# Patient Record
Sex: Male | Born: 1955
Health system: Southern US, Community
[De-identification: ages and names within clinical notes are randomized; demographics above are authoritative.]

## PROBLEM LIST (undated history)

## (undated) DIAGNOSIS — R011 Cardiac murmur, unspecified: Secondary | ICD-10-CM

## (undated) DIAGNOSIS — S81802A Unspecified open wound, left lower leg, initial encounter: Secondary | ICD-10-CM

## (undated) DIAGNOSIS — E1121 Type 2 diabetes mellitus with diabetic nephropathy: Secondary | ICD-10-CM

## (undated) DIAGNOSIS — E1140 Type 2 diabetes mellitus with diabetic neuropathy, unspecified: Secondary | ICD-10-CM

## (undated) DIAGNOSIS — C801 Malignant (primary) neoplasm, unspecified: Secondary | ICD-10-CM

## (undated) DIAGNOSIS — S42302A Unspecified fracture of shaft of humerus, left arm, initial encounter for closed fracture: Secondary | ICD-10-CM

## (undated) DIAGNOSIS — S81801A Unspecified open wound, right lower leg, initial encounter: Secondary | ICD-10-CM

## (undated) DIAGNOSIS — N186 End stage renal disease: Secondary | ICD-10-CM

## (undated) DIAGNOSIS — F329 Major depressive disorder, single episode, unspecified: Secondary | ICD-10-CM

## (undated) DIAGNOSIS — I4891 Unspecified atrial fibrillation: Secondary | ICD-10-CM

## (undated) DIAGNOSIS — I10 Essential (primary) hypertension: Secondary | ICD-10-CM

## (undated) DIAGNOSIS — Z9289 Personal history of other medical treatment: Secondary | ICD-10-CM

## (undated) DIAGNOSIS — D649 Anemia, unspecified: Secondary | ICD-10-CM

## (undated) DIAGNOSIS — K219 Gastro-esophageal reflux disease without esophagitis: Secondary | ICD-10-CM

## (undated) DIAGNOSIS — S0502XA Injury of conjunctiva and corneal abrasion without foreign body, left eye, initial encounter: Secondary | ICD-10-CM

## (undated) DIAGNOSIS — Z940 Kidney transplant status: Secondary | ICD-10-CM

## (undated) DIAGNOSIS — Z992 Dependence on renal dialysis: Secondary | ICD-10-CM

## (undated) DIAGNOSIS — F32A Depression, unspecified: Secondary | ICD-10-CM

## (undated) HISTORY — DX: Type 2 diabetes mellitus with diabetic neuropathy, unspecified: E11.40

## (undated) HISTORY — DX: Malignant (primary) neoplasm, unspecified: C80.1

## (undated) HISTORY — PX: OTHER SURGICAL HISTORY: SHX169

## (undated) HISTORY — DX: Type 2 diabetes mellitus with diabetic nephropathy: E11.21

## (undated) HISTORY — PX: TONSILLECTOMY: SUR1361

## (undated) HISTORY — PX: COLONOSCOPY: SHX174

## (undated) HISTORY — DX: Anemia, unspecified: D64.9

## (undated) HISTORY — PX: REMOVAL OF A DIALYSIS CATHETER: SHX6053

## (undated) HISTORY — PX: NEPHRECTOMY TRANSPLANTED ORGAN: SUR880

## (undated) HISTORY — DX: Essential (primary) hypertension: I10

## (undated) HISTORY — PX: VASCULAR SURGERY: SHX849

---
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
## 2000-05-20 HISTORY — PX: BILIARY DIVERSION, EXTERNAL: SHX1228

## 2000-05-20 HISTORY — PX: PORTACATH PLACEMENT: SHX2246

## 2000-05-20 HISTORY — PX: WHIPPLE PROCEDURE: SHX2667

## 2001-03-10 ENCOUNTER — Ambulatory Visit (HOSPITAL_COMMUNITY): Admission: RE | Admit: 2001-03-10 | Discharge: 2001-03-10 | Payer: Self-pay | Admitting: Family Medicine

## 2001-03-10 ENCOUNTER — Encounter: Payer: Self-pay | Admitting: Family Medicine

## 2001-03-12 ENCOUNTER — Encounter: Payer: Self-pay | Admitting: Gastroenterology

## 2001-03-12 ENCOUNTER — Encounter (INDEPENDENT_AMBULATORY_CARE_PROVIDER_SITE_OTHER): Payer: Self-pay | Admitting: Specialist

## 2001-03-12 ENCOUNTER — Ambulatory Visit (HOSPITAL_COMMUNITY): Admission: RE | Admit: 2001-03-12 | Discharge: 2001-03-12 | Payer: Self-pay | Admitting: Gastroenterology

## 2001-03-19 ENCOUNTER — Encounter: Payer: Self-pay | Admitting: Surgery

## 2001-03-19 ENCOUNTER — Observation Stay (HOSPITAL_COMMUNITY): Admission: RE | Admit: 2001-03-19 | Discharge: 2001-03-20 | Payer: Self-pay | Admitting: Surgery

## 2001-03-21 ENCOUNTER — Inpatient Hospital Stay (HOSPITAL_COMMUNITY): Admission: EM | Admit: 2001-03-21 | Discharge: 2001-04-11 | Payer: Self-pay | Admitting: Emergency Medicine

## 2001-03-21 ENCOUNTER — Encounter (INDEPENDENT_AMBULATORY_CARE_PROVIDER_SITE_OTHER): Payer: Self-pay | Admitting: *Deleted

## 2001-03-21 ENCOUNTER — Ambulatory Visit (HOSPITAL_COMMUNITY): Admission: RE | Admit: 2001-03-21 | Discharge: 2001-03-21 | Payer: Self-pay | Admitting: Surgery

## 2001-03-21 ENCOUNTER — Encounter: Payer: Self-pay | Admitting: Surgery

## 2001-03-23 ENCOUNTER — Encounter: Payer: Self-pay | Admitting: Surgery

## 2001-03-28 ENCOUNTER — Encounter: Payer: Self-pay | Admitting: Surgery

## 2001-04-24 ENCOUNTER — Ambulatory Visit (HOSPITAL_BASED_OUTPATIENT_CLINIC_OR_DEPARTMENT_OTHER): Admission: RE | Admit: 2001-04-24 | Discharge: 2001-04-24 | Payer: Self-pay | Admitting: Surgery

## 2001-04-24 ENCOUNTER — Encounter: Payer: Self-pay | Admitting: Surgery

## 2001-04-24 ENCOUNTER — Encounter (INDEPENDENT_AMBULATORY_CARE_PROVIDER_SITE_OTHER): Payer: Self-pay | Admitting: Specialist

## 2001-10-29 ENCOUNTER — Encounter: Payer: Self-pay | Admitting: Oncology

## 2001-10-29 ENCOUNTER — Ambulatory Visit (HOSPITAL_COMMUNITY): Admission: RE | Admit: 2001-10-29 | Discharge: 2001-10-29 | Payer: Self-pay | Admitting: Oncology

## 2001-11-12 ENCOUNTER — Ambulatory Visit (HOSPITAL_BASED_OUTPATIENT_CLINIC_OR_DEPARTMENT_OTHER): Admission: RE | Admit: 2001-11-12 | Discharge: 2001-11-12 | Payer: Self-pay | Admitting: Surgery

## 2002-04-28 ENCOUNTER — Encounter: Payer: Self-pay | Admitting: Oncology

## 2002-04-28 ENCOUNTER — Ambulatory Visit (HOSPITAL_COMMUNITY): Admission: RE | Admit: 2002-04-28 | Discharge: 2002-04-28 | Payer: Self-pay | Admitting: Oncology

## 2002-05-30 ENCOUNTER — Encounter: Payer: Self-pay | Admitting: Orthopedic Surgery

## 2002-05-30 ENCOUNTER — Ambulatory Visit (HOSPITAL_COMMUNITY): Admission: RE | Admit: 2002-05-30 | Discharge: 2002-05-30 | Payer: Self-pay | Admitting: Orthopedic Surgery

## 2002-06-07 ENCOUNTER — Encounter: Admission: RE | Admit: 2002-06-07 | Discharge: 2002-06-07 | Payer: Self-pay | Admitting: Orthopedic Surgery

## 2002-06-07 ENCOUNTER — Encounter: Payer: Self-pay | Admitting: Orthopedic Surgery

## 2002-12-20 ENCOUNTER — Encounter: Payer: Self-pay | Admitting: Oncology

## 2002-12-20 ENCOUNTER — Ambulatory Visit (HOSPITAL_COMMUNITY): Admission: RE | Admit: 2002-12-20 | Discharge: 2002-12-20 | Payer: Self-pay | Admitting: Oncology

## 2003-01-27 ENCOUNTER — Ambulatory Visit (HOSPITAL_COMMUNITY): Admission: RE | Admit: 2003-01-27 | Discharge: 2003-01-27 | Payer: Self-pay | Admitting: Gastroenterology

## 2003-12-29 ENCOUNTER — Ambulatory Visit (HOSPITAL_COMMUNITY): Admission: RE | Admit: 2003-12-29 | Discharge: 2003-12-29 | Payer: Self-pay | Admitting: Oncology

## 2004-03-22 ENCOUNTER — Ambulatory Visit (HOSPITAL_COMMUNITY): Admission: RE | Admit: 2004-03-22 | Discharge: 2004-03-22 | Payer: Self-pay | Admitting: Oncology

## 2004-04-02 ENCOUNTER — Ambulatory Visit: Payer: Self-pay | Admitting: Oncology

## 2004-04-23 ENCOUNTER — Ambulatory Visit (HOSPITAL_COMMUNITY): Admission: RE | Admit: 2004-04-23 | Discharge: 2004-04-23 | Payer: Self-pay | Admitting: Oncology

## 2004-05-22 ENCOUNTER — Ambulatory Visit: Payer: Self-pay | Admitting: Oncology

## 2005-01-14 ENCOUNTER — Ambulatory Visit: Payer: Self-pay | Admitting: Oncology

## 2005-01-17 ENCOUNTER — Ambulatory Visit (HOSPITAL_COMMUNITY): Admission: RE | Admit: 2005-01-17 | Discharge: 2005-01-17 | Payer: Self-pay | Admitting: Oncology

## 2005-12-25 ENCOUNTER — Ambulatory Visit: Payer: Self-pay | Admitting: Oncology

## 2005-12-27 LAB — CEA: CEA: 2 ng/mL (ref 0.0–5.0)

## 2005-12-27 LAB — COMPREHENSIVE METABOLIC PANEL
ALT: 11 U/L (ref 0–40)
CO2: 24 mEq/L (ref 19–32)
Calcium: 9.2 mg/dL (ref 8.4–10.5)
Chloride: 107 mEq/L (ref 96–112)
Creatinine, Ser: 0.75 mg/dL (ref 0.40–1.50)
Sodium: 142 mEq/L (ref 135–145)
Total Protein: 6.6 g/dL (ref 6.0–8.3)

## 2006-01-01 ENCOUNTER — Encounter: Admission: RE | Admit: 2006-01-01 | Discharge: 2006-01-01 | Payer: Self-pay | Admitting: Oncology

## 2006-12-25 ENCOUNTER — Ambulatory Visit: Payer: Self-pay | Admitting: Oncology

## 2007-06-29 ENCOUNTER — Ambulatory Visit: Payer: Self-pay | Admitting: Oncology

## 2007-09-03 ENCOUNTER — Ambulatory Visit: Payer: Self-pay | Admitting: Oncology

## 2008-10-05 ENCOUNTER — Ambulatory Visit: Payer: Self-pay | Admitting: Oncology

## 2010-04-19 ENCOUNTER — Encounter
Admission: RE | Admit: 2010-04-19 | Discharge: 2010-05-16 | Payer: Self-pay | Source: Home / Self Care | Attending: Diagnostic Neuroimaging | Admitting: Diagnostic Neuroimaging

## 2010-05-20 HISTORY — PX: BONE MARROW BIOPSY: SHX199

## 2010-05-20 HISTORY — PX: RENAL BIOPSY, PERCUTANEOUS: SUR144

## 2010-06-09 ENCOUNTER — Encounter: Payer: Self-pay | Admitting: Oncology

## 2010-06-10 ENCOUNTER — Encounter: Payer: Self-pay | Admitting: Oncology

## 2010-09-25 ENCOUNTER — Other Ambulatory Visit: Payer: Self-pay | Admitting: Nephrology

## 2010-09-25 DIAGNOSIS — N179 Acute kidney failure, unspecified: Secondary | ICD-10-CM

## 2010-09-26 ENCOUNTER — Ambulatory Visit
Admission: RE | Admit: 2010-09-26 | Discharge: 2010-09-26 | Disposition: A | Discharge status: Home / Self Care | Payer: Managed Care, Other (non HMO) | Source: Ambulatory Visit | Attending: Nephrology | Admitting: Nephrology

## 2010-09-26 DIAGNOSIS — N179 Acute kidney failure, unspecified: Secondary | ICD-10-CM

## 2010-10-05 ENCOUNTER — Other Ambulatory Visit: Payer: Self-pay | Admitting: Oncology

## 2010-10-05 ENCOUNTER — Encounter (HOSPITAL_BASED_OUTPATIENT_CLINIC_OR_DEPARTMENT_OTHER): Payer: Managed Care, Other (non HMO) | Admitting: Oncology

## 2010-10-05 DIAGNOSIS — Z8509 Personal history of malignant neoplasm of other digestive organs: Secondary | ICD-10-CM

## 2010-10-05 DIAGNOSIS — D649 Anemia, unspecified: Secondary | ICD-10-CM

## 2010-10-05 DIAGNOSIS — N289 Disorder of kidney and ureter, unspecified: Secondary | ICD-10-CM

## 2010-10-05 LAB — CBC WITH DIFFERENTIAL/PLATELET
EOS%: 2.1 % (ref 0.0–7.0)
HCT: 25.7 % — ABNORMAL LOW (ref 38.4–49.9)
HGB: 8.8 g/dL — ABNORMAL LOW (ref 13.0–17.1)
LYMPH%: 31 % (ref 14.0–49.0)
MONO%: 8.1 % (ref 0.0–14.0)
NEUT%: 58.5 % (ref 39.0–75.0)
Platelets: 154 10*3/uL (ref 140–400)
RBC: 2.95 10*6/uL — ABNORMAL LOW (ref 4.20–5.82)

## 2010-10-05 LAB — RETICULOCYTES
RBC: 2.99 10*6/uL — ABNORMAL LOW (ref 4.20–5.82)
Retic %: 0.68 % (ref 0.50–1.60)
Retic Ct Abs: 20.33 10*3/uL — ABNORMAL LOW (ref 24.10–77.50)

## 2010-10-05 LAB — HOLD TUBE, BLOOD BANK

## 2010-10-05 NOTE — Op Note (Signed)
   NAME:  Paul [name], Paul [name]                           ACCOUNT NO.:  [id number]   MEDICAL RECORD NO.:  [id number]                   PATIENT TYPE:  AMB   LOCATION:  ENDO                                 FACILITY:  [facility]   PHYSICIAN:  [name], M.D.                 DATE OF BIRTH:  1956/01/16   DATE OF PROCEDURE:  DATE OF DISCHARGE:                                 OPERATIVE REPORT   PROCEDURE:  Colonoscopy.   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   DESCRIPTION OF PROCEDURE:  The patient was placed in the left lateral  decubitus position then placed on the pulse monitor with continuous low flow  oxygen delivered by nasal cannula. He was sedated with 125 mcg IV fentanyl  and 10 mg IV Versed. The Olympus video colonoscope was inserted into the  rectum and advanced to the cecum, confirmed by transillumination at  McBurney's point and visualization of the ileocecal valve and appendiceal  orifice and intubation of the terminal ileum. The prep was somewhat limited  throughout the colon with a lot of adherent semisolid material that would  not easily lavage and I could thus not rule out flat lesions less than 1 cm  in diameter in all areas. Otherwise, the cecum, ascending, transverse,  descending, sigmoid and rectum all appeared normal with no masses, polyps,  diverticula or other mucosal abnormalities. The scope was then withdrawn and  the patient returned to the recovery room in stable condition. The patient  tolerated the procedure well and there were no immediate complications.   IMPRESSION:  Normal colonoscopy limited by somewhat poor prep.   PLAN:  History of duodenal carcinoma. I am going to repeat his colonoscopy  in three years with a more aggressive prep.                                               John C.  Amedeo Plenty, M.D.    [facility]MED[facility]  D:  01/27/2003  T:  01/27/2003  Job:  [id number]   cc:   [name], M.D.  [address]  [place] [address]  Fax: [phone]

## 2010-10-05 NOTE — Procedures (Signed)
[facility]  Patient:    Paul [name], Paul [name] Visit Number: [id number] MRN: [id number]          Service Type: END Location: ENDO Attending Physician:  Rafael Bihari Proc. Date: 03/12/01 Admit Date:  03/12/2001 Discharge Date: 03/12/2001   CC:         Shanon Brow B. Burnett Harry, M.D.  [name], M.D.   Procedure Report  PROCEDURE:  Esophagogastroduodenoscopy with biopsy.  INDICATION:  Obstructive jaundice with computed tomography scan showing biliary obstruction, pancreatic duct dilatation, and what appeared to be a large duodenal mass.  DESCRIPTION OF PROCEDURE:  The patient was placed in the prone position and placed on the pulse monitor with continuous low-flow oxygen delivered by nasal cannula.  He was sedated with 100 mg IV Demerol and 8 mg IV Versed.  The Olympus side-viewing endoscope was advanced blindly to the oropharynx and esophagus.  The esophagus was straight and of normal caliber with the squamocolumnar line at 38 cm.  The stomach was entered, and a small amount of liquid secretions were suctioned from the fundus.  Retroflexed view of the cardia was unremarkable.  The fundus, body and pylorus all appeared normal. The duodenum was entered, and the bulb appeared normal but around the first bend, there was a large, friable, seemingly circumferential duodenal mass that completely obscured localization of the ampulla, and I did not think it was feasible or likely successful to attempt prolonged probing with the sphincterotome to try to identify the ampulla which was felt to be unlikely. The mass was quite friable and typical for malignancy in appearance and texture.  Multiple biopsies were taken.  I could not pass the side-viewing scope completely beyond it, and it appeared to involve the entire circumference of the duodenum in areas.  The scope was then withdrawn, and the patient returned to the recovery room in stable condition.  He tolerated  the procedure well, and there were no immediate complications.  IMPRESSION:  Large periampullary/duodenal mass, highly suspicious for malignancy.  PLAN:  Will need surgical consultation for evaluation for possible radical curative surgery versus biliary and gastric bypass. Attending Physician:  Rafael Bihari DD:  03/12/01 TD:  03/14/01 Job: [id number]

## 2010-10-05 NOTE — Discharge Summary (Signed)
[facility]  Patient:    Paul [name], MCLOONE Visit Number: [id number] MRN: [id number]          Service Type: DSU Location: [facility] Attending Physician:  Gomez Cleverly Dictated by:   [name], M.D. Admit Date:  04/24/2001 Discharge Date: 04/24/2001                             Discharge Summary  REASON FOR ADMISSION:  Upper gastrointestinal obstruction, duodenal carcinoma.  BRIEF HISTORY:  The patient is a 55 year old white male who has previously been seen and evaluated in my practice for newly diagnosed duodenal mass. Biopsies show adenocarcinoma.  The mass was near obstructing.  The patient was anemic due to bleeding from the mass.  He was undergoing preoperative assessment with CT scan, MRI scan, visceral arteriogram, and percutaneous transhepatic catheter placement for biliary decompression.  In the midst of his workup, the patient developed nausea, vomiting, fatigue, and dehydration. He presented to the emergency department at [facility] where he was seen and evaluated by Dr. Milana Kidney and admitted to my service at [place]:  The patient was admitted on March 21, 2001 by Dr. Rise Patience.  The patient was also seen in consultation by Dr. Mills Koller from [facility].  The patients diabetes was managed by the hospitalist. A central line was placed to establish hyperalimentation for malnutrition. The patient was then gradually prepared for the operating room.  After approximately seven days of hyperalimentation, the patient was taken to the operating room, where he underwent exploratory laparotomy and Whipple procedure for primary carcinoma of the duodenum.  He also underwent placement of feeding jejunostomy tube and cholecystectomy concurrently.  The operative time was approximately nine hours.  The patient was admitted to the intensive care unit postoperatively.  He did well.  He  remained stable.  He continued to require hyperalimentation.  He continued to require close attention to treatment of hyperglycemia by the hospitalist.  The patient made steady progress without significant postoperative complication.  Foley catheter was removed.  He was seen in consultation by Dr. Julieanne Manson from medical oncology.  The patient was begun on tube feedings through his jejunostomy feeding tube.  This was gradually advanced.  The patient became ambulatory. Nasogastric tube was removed on the sixth postoperative day.  He was started on a clear-liquid diet by the seventh postoperative day.  The patient continued to be followed closely by [facility] throughout his postoperative course.  He began diabetic teaching.  The patient was advanced to a full-liquid diet and started on oral medications, both per mouth and per jejunostomy feeding tube.  The patient was advanced to a 2400 calorie ADA diet on the 12th postoperative day.  Staples were removed from the wounds.  IVs were discontinued on the 13th postoperative day.  The patient was prepared for discharge home on April 11, 2001.  DISCHARGE PLAN:  The patient was discharged on April 11, 2001 in good condition, tolerating a diabetic diet, and ambulating independently.  DISCHARGE MEDICATIONS: 1. Tylox for pain. 2. Reglan before meals and at bedtime. 3. Pepcid twice daily. 4. 70/30 insulin 28 units subcu q.a.m. and 8 units subcu q.p.m.  FOLLOWUP:  The patient will be seen back in my office at [facility] in 10 days.  Followup has been arranged with his primary care physician, Dr. Gilmore Laroche, in two weeks.  The patient will  also be seen in followup by Dr. Julieanne Manson at the cancer center.  FINAL DIAGNOSIS:  Primary duodenal carcinoma.  CONDITION ON DISCHARGE:  Improved. Dictated by:   [name], M.D. Attending Physician:  Gomez Cleverly DD:  05/05/01 TD:  05/05/01 Job:  [id number]

## 2010-10-05 NOTE — H&P (Signed)
[facility]  Patient:    Paul [name], Paul [name] Visit Number: [id number] MRN: [id number]          Service Type: SUR Location: 4W [id number] Attending Physician:  Gomez Cleverly Dictated by:   Orson Ape. Rise Patience, M.D. Admit Date:  03/21/2001                           History and Physical  CHIEF COMPLAINT:  Weakness and jaundice.  HISTORY OF PRESENT ILLNESS:  [name] is a 55 year old Caucasian male who was having a procedure, MRI of his abdomen at [facility] today, and the technician called me because of the patients weakness and family reported that he nearly passed out last night.  The patients history is as follows.  This past spring he was noted to be a mild diabetic and his weight had been about 235.  He did not actually start any medications; Dr. [name], M.D. is his physician.  Then approximately three weeks ago he started having back pain and then shortly afterwards noticed that he was becoming jaundice.  I am not sure as far as next sequence whether he had a CT or just what, but he saw Dr. Harlow Asa Thursday, and I think Friday at [facility] he had a 24-hour admission where he had a percutaneous drainage of a common bile duct.  He had I think a mesenteric arteriogram, but am not sure of that.  He had an endoscopy which showed what appears to be a periampullary carcinoma and was released on Friday.  He was scheduled for an MRI today and then was suppose to see Dr. Harlow Asa in the office on Monday so that he could talk with him and plan surgery.  The patients wife, however, said that last evening after he came home they went on an errand and he got up and nearly passed out.  He did not actually become unconscious.  He has been eating very little over the last several days.  I advised that he come on to the emergency room.  I did get the electrolytes and etc. from [facility].  On evaluating him here in  the emergency room he certainly does not appear to be septic.  His pulse is elevated, but he is cooperative and talkative.  He is deeply jaundiced, but his bilirubin measures 8.5 where it had measured 22 at [facility] earlier this week.  He was noted to have a mildly elevated glucose over at [facility], but today his glucose is 257, and I think that the actual problem is that he is probably basically not able to eat and then with the T-tube loss, hyperelevated glucose, and the spillage that he will have in his urine, he is becoming mild dehydrated.  I reviewed the MRI with the radiologist and it appears that this is a mass in the third portion of the duodenum.  It is fairly large with a lot of external pressure on the portal vein area, but it is not an obvious erosion, growth into the vein or arteries, and there is no liver metastasis or anything that an obvious nonresectable tumor.  The patient I think needs hydration and hospitalization and will continue on a clear liquid diet.  I will notify Dr. Harlow Asa of his admission and then let him see him here and plan surgery in the next few days; whether it is a resectable Whipple  resection or whether he will need an internal double bypass, I am not sure.  CURRENT MEDICATIONS: 1. Amaryl 2 mg for diabetes. 2. Vicodin or Tylox for pain that he takes probably 1-2 a day.  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  He states that his heaviest weight was about 228 pounds.  He does not look anywhere near that weight today, but he has not been weighed.  ALLERGIES:  None.  FAMILY HISTORY:  Noncontributory.  PHYSICAL EXAMINATION:  VITAL SIGNS:  Vital signs in the emergency room blood pressure is 110/79, pulse 130, respirations 18, and temperature 97.  GENERAL:  He is a deeply jaundiced male who appears ill, but not acutely ill.  HEENT:  No cervical or supraclavicular lymphadenopathy.  Appears to  have slightly dry mucous membranes.  LUNGS:  Good breath sounds bilaterally.  HEART:  Mechanical sinus tachycardia.  ABDOMEN:  Not acutely tender.  RECTAL:  Did not do a rectal exam, but reports his stools are _____ and this has been going on for about two to three weeks.  EXTREMITIES:  He does not have any pedal edema.  NEUROLOGIC:  Physiologic.  ADMISSION IMPRESSION: 1. Biopsy-proven duodenal paraduodenal mass, carcinoma with secondary    diabetes. 2. Dehydration secondary to #1.  He does have a T-tube draining well in the    right upper quadrant.  PLAN:  The patient has been given one liter of lactated ringers and will give a second.  Will place him on IV at 125 cc/hr and put him on a sliding insulin scale.  Recheck a CMET and CBC in a.m.  I am not going to place him on antibiotic, as I do not think he is septic.  I will notify Dr. Harlow Asa of his admission here and see if he could possibly see him tomorrow evening, and see if surgery scheduling can be arranged in the next few days.  I will notify the [facility] hospitalist of his admission for help and management of his diabetes. Dictated by:   Orson Ape. Rise Patience, M.D. Attending Physician:  Gomez Cleverly DD:  03/21/01 TD:  03/23/01 Job: [id number]

## 2010-10-05 NOTE — Consult Note (Signed)
[facility]  Patient:    Paul [name], Paul [name] Visit Number: [id number] MRN: [id number]          Service Type: SUR Location: 3W [id number] Attending Physician:  Gomez Cleverly Dictated by:   Kandis Cocking, N.P. Proc. Date: 04/01/01 Admit Date:  03/21/2001   CC:         [name], M.D.  [name], M.D.  [name], M.D.  [name], M.D.  [name]   Consultation Report  DATE OF BIRTH:  Apr 21, 1956  ATTENDING PHYSICIAN:  [name], M.D.  REASON FOR THE CONSULTATION:  Invasive adenocarcinoma, moderate differentiated, of the duodenum.  REQUESTING PHYSICIAN:  [name], M.D.  HISTORY OF PRESENT ILLNESS:  This is a 56 year old male with recent history of back pain, weight loss and jaundice, who underwent an endoscopy that revealed a mass in the second and third portion of the duodenum.  He is followed by Dr. [name], who referred him to Dr. [name] for evaluation. A CT of the abdomen and pelvis on March 10, 2001 showed a mass abutting the head of the pancreas and circumferentially involving the descending and transverse duodenum.  There are no liver lesions, spleen and adrenals are normal.  Probable medullary sponge kidneys are noted.  On March 27, 2001, [name] underwent a Whipple procedure, jejunostomy placement and a cholecystectomy.  Pathology report [id number]:  Final diagnoses:  In removal of the celiac access lymph node, one lymph node is negative for metastatic carcinoma.  #2 - Removal of tissue from the area of the common bile duct:  One lymph node negative for metastatic carcinoma.  Of the Whipple procedure, there was invasive adenocarcinoma, moderately differentiated, 9 cm.  The tumor extends through the muscularis propria and into adjacent pancreatic tissue.  The margins including radial, proximal, gastric, distal small bowel and bile duct margins are free of tumor.   #3 - Six peripancreatic lymph nodes negative for metastatic carcinoma.  One greater curvature lymph node negative for metastatic carcinoma.  Chronic pancreatitis. Pancreatic intraepithelial neoplasm.  #4 - Cholecystectomy:  Gallbladder showed mild cholesterolosis.  No definite lymphvascular invasion was identified.  PAST MEDICAL HISTORY: 1. Diabetes mellitus diagnosed April 2002. 2. Fracture of right and left arms as child.  PAST SURGICAL HISTORY:  Tonsillectomy as a child.  ALLERGIES:  No known drug allergies.  MEDICATIONS:  Amaryl 2 mg.  FAMILY HISTORY:  Mother is alive at 1; she has diabetes mellitus as well as pacemaker.  Father is alive in his 96s with prostate cancer and severe osteoarthritis.  [name] has one brother 4 who also has diabetes mellitus.  SOCIAL HISTORY:  [name] is married.  He and his wife have three children; the wifes name is [name].  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  The childrens names are [name], 17; [name], 14; and [name], [place], who is at [employer] also studying to be an Chief Financial Officer.  REVIEW OF SYSTEMS:  Mr. Tippie denies any headaches.  He has had no night sweats until this hospitalization.  He has been short of breath since the surgery; he also has had pleuritic pain as well as right upper quadrant pain with inspiration.  He also has had esophageal reflux symptoms for several weeks prior to surgery.  His bowel movements have become irregular.  He has also had frequent urination and polydipsia for several years.  LABORATORY DATA:  Labs from March 30, 2001:  WBC 11.8, hemoglobin 10.5, hematocrit 30.7, platelets at 177,000.  Magnesium is 1.8, phosphorus is 3.1, sodium 135, potassium 4.0, chloride 103, CO2 32, glucose 128, BUN 19, creatinine 0.6, calcium 8.0, total protein 4.6, albumin 1.6, total bilirubin 2.8, prealbumin is 4.6.  Chest x-ray on March 28, 2001 shows increased right bibasilar  atelectasis.  PHYSICAL EXAMINATION:  GENERAL:  [age]-year-old white male with rigid posture and flat affect.  VITAL SIGNS:  Temperature is 99, pulse 90, respirations are 20, blood pressure 150/95, O2 saturations at 97.  HEENT:  Normocephalic.  Sclerae are slightly icteric.  Mucous membranes and oropharynx are moist without lesions.  NODES:  No cervical, axillary or inguinal nodes appreciated.  LUNGS:  Breath sounds are decreased, right hemithorax.  CARDIOVASCULAR:  Regular rate and rhythm.  No murmur.  No gallop.  ABDOMEN:  Hypoactive bowel sounds.  Positive for tender in the right upper quadrant.  Abdomen is tight.  There is a right J tube.  Incision is clean.  EXTREMITIES:  There is no cyanosis, clubbing or edema bilaterally.  NEUROLOGIC:  Alert and oriented x 3.  Cranial nerves II-XII intact.  DTRs are 1+ bilaterally.  Strength is 4/5.  ASSESSMENT AND PLAN:  The patient was seen and examined and x-rays reviewed by Dr. Dominica Severin B. 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  Dr. Benay Spice discussed small bowel adenocarcinoma with the patient.  There are no large studies to suggest a benefit for adjunctive therapy, however, considering colon cancer literature, Dr. Benay Spice recommends adjunctive 5-FU chemotherapy for a six-month period. Surgical margins are negative.  I see no clear role for ______ .  We will ask radiation oncology to see him.  We will obtain a carcinoembryonic antigen and hopefully we can add this to previous laboratories.  I will plan for  adjunctive therapy to begin in approximately three weeks. Dictated by:   Kandis Cocking, N.P. Attending Physician:  Gomez Cleverly DD:  04/03/01 TD:  04/03/01 Job: [id number]

## 2010-10-06 LAB — LACTATE DEHYDROGENASE: LDH: 152 U/L (ref 94–250)

## 2010-10-10 ENCOUNTER — Encounter (HOSPITAL_BASED_OUTPATIENT_CLINIC_OR_DEPARTMENT_OTHER): Payer: Managed Care, Other (non HMO) | Admitting: Oncology

## 2010-10-10 ENCOUNTER — Other Ambulatory Visit (HOSPITAL_COMMUNITY)
Admission: RE | Admit: 2010-10-10 | Discharge: 2010-10-10 | Disposition: A | Discharge status: Home / Self Care | Payer: Managed Care, Other (non HMO) | Source: Ambulatory Visit | Attending: Oncology | Admitting: Oncology

## 2010-10-10 ENCOUNTER — Other Ambulatory Visit: Payer: Self-pay | Admitting: Oncology

## 2010-10-10 DIAGNOSIS — N19 Unspecified kidney failure: Secondary | ICD-10-CM | POA: Insufficient documentation

## 2010-10-10 DIAGNOSIS — D649 Anemia, unspecified: Secondary | ICD-10-CM

## 2010-10-10 LAB — CBC
HCT: 26 % — ABNORMAL LOW (ref 39.0–52.0)
Hemoglobin: 8.6 g/dL — ABNORMAL LOW (ref 13.0–17.0)
MCH: 29.6 pg (ref 26.0–34.0)
MCV: 89.3 fL (ref 78.0–100.0)
RBC: 2.91 MIL/uL — ABNORMAL LOW (ref 4.22–5.81)

## 2010-10-10 LAB — DIFFERENTIAL
Eosinophils Absolute: 0.1 10*3/uL (ref 0.0–0.7)
Lymphocytes Relative: 25 % (ref 12–46)
Lymphs Abs: 1.5 10*3/uL (ref 0.7–4.0)
Monocytes Relative: 10 % (ref 3–12)
Neutro Abs: 3.9 10*3/uL (ref 1.7–7.7)
Neutrophils Relative %: 63 % (ref 43–77)

## 2010-10-18 ENCOUNTER — Encounter (HOSPITAL_BASED_OUTPATIENT_CLINIC_OR_DEPARTMENT_OTHER): Payer: Managed Care, Other (non HMO) | Admitting: Oncology

## 2010-10-18 DIAGNOSIS — D649 Anemia, unspecified: Secondary | ICD-10-CM

## 2010-10-18 DIAGNOSIS — Z8509 Personal history of malignant neoplasm of other digestive organs: Secondary | ICD-10-CM

## 2010-10-29 ENCOUNTER — Encounter (HOSPITAL_BASED_OUTPATIENT_CLINIC_OR_DEPARTMENT_OTHER): Payer: Managed Care, Other (non HMO) | Admitting: Oncology

## 2010-10-29 ENCOUNTER — Other Ambulatory Visit: Payer: Self-pay | Admitting: Oncology

## 2010-10-29 DIAGNOSIS — Z8509 Personal history of malignant neoplasm of other digestive organs: Secondary | ICD-10-CM

## 2010-10-29 DIAGNOSIS — D696 Thrombocytopenia, unspecified: Secondary | ICD-10-CM

## 2010-10-29 DIAGNOSIS — R634 Abnormal weight loss: Secondary | ICD-10-CM

## 2010-10-29 DIAGNOSIS — N289 Disorder of kidney and ureter, unspecified: Secondary | ICD-10-CM

## 2010-10-29 DIAGNOSIS — D649 Anemia, unspecified: Secondary | ICD-10-CM

## 2010-10-29 LAB — CBC WITH DIFFERENTIAL/PLATELET
BASO%: 0.5 % (ref 0.0–2.0)
Basophils Absolute: 0 10e3/uL (ref 0.0–0.1)
EOS%: 2.2 % (ref 0.0–7.0)
Eosinophils Absolute: 0.1 10e3/uL (ref 0.0–0.5)
HCT: 24.4 % — ABNORMAL LOW (ref 38.4–49.9)
HGB: 8.2 g/dL — ABNORMAL LOW (ref 13.0–17.1)
LYMPH%: 31.6 % (ref 14.0–49.0)
MCH: 30.6 pg (ref 27.2–33.4)
MCHC: 33.8 g/dL (ref 32.0–36.0)
MCV: 90.8 fL (ref 79.3–98.0)
MONO#: 0.4 10e3/uL (ref 0.1–0.9)
MONO%: 8 % (ref 0.0–14.0)
NEUT#: 3.2 10e3/uL (ref 1.5–6.5)
NEUT%: 57.7 % (ref 39.0–75.0)
Platelets: 124 10e3/uL — ABNORMAL LOW (ref 140–400)
RBC: 2.69 10e6/uL — ABNORMAL LOW (ref 4.20–5.82)
RDW: 13.6 % (ref 11.0–14.6)
WBC: 5.6 10e3/uL (ref 4.0–10.3)
lymph#: 1.8 10e3/uL (ref 0.9–3.3)

## 2010-10-29 LAB — CHROMOSOME ANALYSIS, BONE MARROW

## 2010-10-31 ENCOUNTER — Other Ambulatory Visit (HOSPITAL_COMMUNITY): Payer: Self-pay | Admitting: Nephrology

## 2010-10-31 DIAGNOSIS — N179 Acute kidney failure, unspecified: Secondary | ICD-10-CM

## 2010-11-05 ENCOUNTER — Encounter (HOSPITAL_BASED_OUTPATIENT_CLINIC_OR_DEPARTMENT_OTHER): Payer: Managed Care, Other (non HMO) | Admitting: Oncology

## 2010-11-05 ENCOUNTER — Other Ambulatory Visit: Payer: Self-pay | Admitting: Oncology

## 2010-11-05 DIAGNOSIS — Z8509 Personal history of malignant neoplasm of other digestive organs: Secondary | ICD-10-CM

## 2010-11-05 DIAGNOSIS — N289 Disorder of kidney and ureter, unspecified: Secondary | ICD-10-CM

## 2010-11-05 DIAGNOSIS — R634 Abnormal weight loss: Secondary | ICD-10-CM

## 2010-11-05 DIAGNOSIS — D696 Thrombocytopenia, unspecified: Secondary | ICD-10-CM

## 2010-11-05 DIAGNOSIS — D649 Anemia, unspecified: Secondary | ICD-10-CM

## 2010-11-05 LAB — CBC WITH DIFFERENTIAL/PLATELET
Basophils Absolute: 0 10*3/uL (ref 0.0–0.1)
EOS%: 2.3 % (ref 0.0–7.0)
Eosinophils Absolute: 0.1 10*3/uL (ref 0.0–0.5)
HGB: 9.4 g/dL — ABNORMAL LOW (ref 13.0–17.1)
LYMPH%: 24.7 % (ref 14.0–49.0)
MCH: 30.8 pg (ref 27.2–33.4)
MCV: 92.6 fL (ref 79.3–98.0)
MONO%: 8.8 % (ref 0.0–14.0)
Platelets: 145 10*3/uL (ref 140–400)
RBC: 3.07 10*6/uL — ABNORMAL LOW (ref 4.20–5.82)
RDW: 14.5 % (ref 11.0–14.6)

## 2010-11-09 ENCOUNTER — Ambulatory Visit (HOSPITAL_COMMUNITY)
Admission: RE | Admit: 2010-11-09 | Discharge: 2010-11-09 | Disposition: A | Discharge status: Home / Self Care | Payer: Managed Care, Other (non HMO) | Source: Ambulatory Visit | Attending: Nephrology | Admitting: Nephrology

## 2010-11-09 ENCOUNTER — Other Ambulatory Visit: Payer: Self-pay | Admitting: Interventional Radiology

## 2010-11-09 DIAGNOSIS — Z01812 Encounter for preprocedural laboratory examination: Secondary | ICD-10-CM | POA: Insufficient documentation

## 2010-11-09 DIAGNOSIS — N179 Acute kidney failure, unspecified: Secondary | ICD-10-CM

## 2010-11-09 LAB — CBC
HCT: 30.7 % — ABNORMAL LOW (ref 39.0–52.0)
MCHC: 33.6 g/dL (ref 30.0–36.0)
MCV: 90.3 fL (ref 78.0–100.0)
Platelets: 147 10*3/uL — ABNORMAL LOW (ref 150–400)
RDW: 15.1 % (ref 11.5–15.5)
WBC: 4.9 10*3/uL (ref 4.0–10.5)

## 2010-11-09 LAB — APTT: aPTT: 29 seconds (ref 24–37)

## 2010-11-12 ENCOUNTER — Other Ambulatory Visit: Payer: Self-pay | Admitting: Oncology

## 2010-11-12 ENCOUNTER — Encounter (HOSPITAL_BASED_OUTPATIENT_CLINIC_OR_DEPARTMENT_OTHER): Payer: Managed Care, Other (non HMO) | Admitting: Oncology

## 2010-11-12 DIAGNOSIS — D696 Thrombocytopenia, unspecified: Secondary | ICD-10-CM

## 2010-11-12 DIAGNOSIS — D638 Anemia in other chronic diseases classified elsewhere: Secondary | ICD-10-CM

## 2010-11-12 DIAGNOSIS — R634 Abnormal weight loss: Secondary | ICD-10-CM

## 2010-11-12 DIAGNOSIS — N289 Disorder of kidney and ureter, unspecified: Secondary | ICD-10-CM

## 2010-11-12 LAB — CBC WITH DIFFERENTIAL/PLATELET
Basophils Absolute: 0 10*3/uL (ref 0.0–0.1)
Eosinophils Absolute: 0.1 10*3/uL (ref 0.0–0.5)
HCT: 28.5 % — ABNORMAL LOW (ref 38.4–49.9)
HGB: 9.4 g/dL — ABNORMAL LOW (ref 13.0–17.1)
LYMPH%: 23.9 % (ref 14.0–49.0)
MCV: 91.3 fL (ref 79.3–98.0)
MONO#: 0.9 10*3/uL (ref 0.1–0.9)
MONO%: 13.2 % (ref 0.0–14.0)
NEUT#: 4.1 10*3/uL (ref 1.5–6.5)
NEUT%: 60.9 % (ref 39.0–75.0)
Platelets: 154 10*3/uL (ref 140–400)
WBC: 6.7 10*3/uL (ref 4.0–10.3)

## 2010-11-19 ENCOUNTER — Encounter (HOSPITAL_BASED_OUTPATIENT_CLINIC_OR_DEPARTMENT_OTHER): Payer: Managed Care, Other (non HMO) | Admitting: Oncology

## 2010-11-19 ENCOUNTER — Other Ambulatory Visit: Payer: Self-pay | Admitting: Oncology

## 2010-11-19 DIAGNOSIS — D649 Anemia, unspecified: Secondary | ICD-10-CM

## 2010-11-19 DIAGNOSIS — N289 Disorder of kidney and ureter, unspecified: Secondary | ICD-10-CM

## 2010-11-19 DIAGNOSIS — R634 Abnormal weight loss: Secondary | ICD-10-CM

## 2010-11-19 LAB — CBC WITH DIFFERENTIAL/PLATELET
BASO%: 0.3 % (ref 0.0–2.0)
HCT: 32.2 % — ABNORMAL LOW (ref 38.4–49.9)
LYMPH%: 22.1 % (ref 14.0–49.0)
MCH: 29.5 pg (ref 27.2–33.4)
MCHC: 32.6 g/dL (ref 32.0–36.0)
MCV: 90.4 fL (ref 79.3–98.0)
MONO#: 0.8 10*3/uL (ref 0.1–0.9)
MONO%: 10.5 % (ref 0.0–14.0)
NEUT%: 65.2 % (ref 39.0–75.0)
Platelets: 202 10*3/uL (ref 140–400)
RBC: 3.56 10*6/uL — ABNORMAL LOW (ref 4.20–5.82)
WBC: 7.5 10*3/uL (ref 4.0–10.3)

## 2010-11-27 ENCOUNTER — Other Ambulatory Visit: Payer: Self-pay | Admitting: Oncology

## 2010-11-27 ENCOUNTER — Encounter (HOSPITAL_BASED_OUTPATIENT_CLINIC_OR_DEPARTMENT_OTHER): Payer: Managed Care, Other (non HMO) | Admitting: Oncology

## 2010-11-27 DIAGNOSIS — D649 Anemia, unspecified: Secondary | ICD-10-CM

## 2010-11-27 DIAGNOSIS — D696 Thrombocytopenia, unspecified: Secondary | ICD-10-CM

## 2010-11-27 DIAGNOSIS — N289 Disorder of kidney and ureter, unspecified: Secondary | ICD-10-CM

## 2010-11-27 DIAGNOSIS — R634 Abnormal weight loss: Secondary | ICD-10-CM

## 2010-11-27 LAB — CBC WITH DIFFERENTIAL/PLATELET
BASO%: 0.7 % (ref 0.0–2.0)
Basophils Absolute: 0 10*3/uL (ref 0.0–0.1)
EOS%: 2.6 % (ref 0.0–7.0)
HCT: 34.8 % — ABNORMAL LOW (ref 38.4–49.9)
HGB: 11.4 g/dL — ABNORMAL LOW (ref 13.0–17.1)
MCH: 28.8 pg (ref 27.2–33.4)
MCHC: 32.8 g/dL (ref 32.0–36.0)
MCV: 87.9 fL (ref 79.3–98.0)
MONO%: 8.1 % (ref 0.0–14.0)
NEUT%: 59.8 % (ref 39.0–75.0)
RDW: 13.4 % (ref 11.0–14.6)

## 2010-12-10 ENCOUNTER — Other Ambulatory Visit: Payer: Self-pay | Admitting: Oncology

## 2010-12-10 ENCOUNTER — Encounter (HOSPITAL_BASED_OUTPATIENT_CLINIC_OR_DEPARTMENT_OTHER): Payer: Managed Care, Other (non HMO) | Admitting: Oncology

## 2010-12-10 DIAGNOSIS — Z8509 Personal history of malignant neoplasm of other digestive organs: Secondary | ICD-10-CM

## 2010-12-10 DIAGNOSIS — D649 Anemia, unspecified: Secondary | ICD-10-CM

## 2010-12-10 DIAGNOSIS — N289 Disorder of kidney and ureter, unspecified: Secondary | ICD-10-CM

## 2010-12-10 LAB — CBC WITH DIFFERENTIAL/PLATELET
BASO%: 0.4 % (ref 0.0–2.0)
EOS%: 1.6 % (ref 0.0–7.0)
LYMPH%: 31.6 % (ref 14.0–49.0)
MCHC: 33.2 g/dL (ref 32.0–36.0)
MCV: 87.5 fL (ref 79.3–98.0)
MONO%: 7.1 % (ref 0.0–14.0)
Platelets: 142 10*3/uL (ref 140–400)
RBC: 3.9 10*6/uL — ABNORMAL LOW (ref 4.20–5.82)

## 2010-12-25 ENCOUNTER — Other Ambulatory Visit: Payer: Self-pay | Admitting: Oncology

## 2010-12-25 ENCOUNTER — Encounter (HOSPITAL_BASED_OUTPATIENT_CLINIC_OR_DEPARTMENT_OTHER): Payer: Managed Care, Other (non HMO) | Admitting: Oncology

## 2010-12-25 DIAGNOSIS — D696 Thrombocytopenia, unspecified: Secondary | ICD-10-CM

## 2010-12-25 DIAGNOSIS — N289 Disorder of kidney and ureter, unspecified: Secondary | ICD-10-CM

## 2010-12-25 DIAGNOSIS — D649 Anemia, unspecified: Secondary | ICD-10-CM

## 2010-12-25 DIAGNOSIS — R634 Abnormal weight loss: Secondary | ICD-10-CM

## 2010-12-25 LAB — CBC WITH DIFFERENTIAL/PLATELET
BASO%: 0.5 % (ref 0.0–2.0)
LYMPH%: 26.8 % (ref 14.0–49.0)
MCHC: 33.4 g/dL (ref 32.0–36.0)
MONO#: 0.5 10*3/uL (ref 0.1–0.9)
RBC: 3.95 10*6/uL — ABNORMAL LOW (ref 4.20–5.82)
RDW: 14.1 % (ref 11.0–14.6)
WBC: 6.7 10*3/uL (ref 4.0–10.3)
lymph#: 1.8 10*3/uL (ref 0.9–3.3)

## 2011-01-11 ENCOUNTER — Encounter (HOSPITAL_BASED_OUTPATIENT_CLINIC_OR_DEPARTMENT_OTHER): Payer: Managed Care, Other (non HMO) | Admitting: Oncology

## 2011-01-11 ENCOUNTER — Other Ambulatory Visit: Payer: Self-pay | Admitting: Oncology

## 2011-01-11 DIAGNOSIS — D649 Anemia, unspecified: Secondary | ICD-10-CM

## 2011-01-11 DIAGNOSIS — R634 Abnormal weight loss: Secondary | ICD-10-CM

## 2011-01-11 DIAGNOSIS — D696 Thrombocytopenia, unspecified: Secondary | ICD-10-CM

## 2011-01-11 DIAGNOSIS — N289 Disorder of kidney and ureter, unspecified: Secondary | ICD-10-CM

## 2011-01-11 LAB — CBC WITH DIFFERENTIAL/PLATELET
BASO%: 0.9 % (ref 0.0–2.0)
HCT: 31.6 % — ABNORMAL LOW (ref 38.4–49.9)
HGB: 10.6 g/dL — ABNORMAL LOW (ref 13.0–17.1)
MCHC: 33.4 g/dL (ref 32.0–36.0)
MONO#: 0.4 10*3/uL (ref 0.1–0.9)
NEUT%: 62.2 % (ref 39.0–75.0)
WBC: 6.3 10*3/uL (ref 4.0–10.3)
lymph#: 1.8 10*3/uL (ref 0.9–3.3)

## 2011-02-05 ENCOUNTER — Encounter (HOSPITAL_BASED_OUTPATIENT_CLINIC_OR_DEPARTMENT_OTHER): Payer: Managed Care, Other (non HMO) | Admitting: Oncology

## 2011-02-05 ENCOUNTER — Other Ambulatory Visit: Payer: Self-pay | Admitting: Oncology

## 2011-02-05 DIAGNOSIS — R634 Abnormal weight loss: Secondary | ICD-10-CM

## 2011-02-05 DIAGNOSIS — D696 Thrombocytopenia, unspecified: Secondary | ICD-10-CM

## 2011-02-05 DIAGNOSIS — N289 Disorder of kidney and ureter, unspecified: Secondary | ICD-10-CM

## 2011-02-05 DIAGNOSIS — D649 Anemia, unspecified: Secondary | ICD-10-CM

## 2011-02-05 DIAGNOSIS — Z8509 Personal history of malignant neoplasm of other digestive organs: Secondary | ICD-10-CM

## 2011-02-05 LAB — CBC WITH DIFFERENTIAL/PLATELET
BASO%: 0.5 % (ref 0.0–2.0)
EOS%: 0.9 % (ref 0.0–7.0)
MCH: 28.6 pg (ref 27.2–33.4)
MCHC: 34 g/dL (ref 32.0–36.0)
RBC: 3.6 10*6/uL — ABNORMAL LOW (ref 4.20–5.82)
RDW: 15.9 % — ABNORMAL HIGH (ref 11.0–14.6)
lymph#: 1.6 10*3/uL (ref 0.9–3.3)

## 2011-02-26 ENCOUNTER — Encounter (HOSPITAL_BASED_OUTPATIENT_CLINIC_OR_DEPARTMENT_OTHER): Payer: Managed Care, Other (non HMO) | Admitting: Oncology

## 2011-02-26 ENCOUNTER — Other Ambulatory Visit: Payer: Self-pay | Admitting: Oncology

## 2011-02-26 DIAGNOSIS — N289 Disorder of kidney and ureter, unspecified: Secondary | ICD-10-CM

## 2011-02-26 DIAGNOSIS — D649 Anemia, unspecified: Secondary | ICD-10-CM

## 2011-02-26 DIAGNOSIS — R634 Abnormal weight loss: Secondary | ICD-10-CM

## 2011-02-26 LAB — CBC WITH DIFFERENTIAL/PLATELET
BASO%: 0.4 % (ref 0.0–2.0)
EOS%: 1.4 % (ref 0.0–7.0)
MCH: 27.6 pg (ref 27.2–33.4)
MCHC: 33.4 g/dL (ref 32.0–36.0)
RBC: 3.77 10*6/uL — ABNORMAL LOW (ref 4.20–5.82)
RDW: 14.4 % (ref 11.0–14.6)
lymph#: 2.6 10*3/uL (ref 0.9–3.3)

## 2011-03-19 ENCOUNTER — Other Ambulatory Visit: Payer: Self-pay | Admitting: Oncology

## 2011-03-19 ENCOUNTER — Encounter (HOSPITAL_BASED_OUTPATIENT_CLINIC_OR_DEPARTMENT_OTHER): Payer: Managed Care, Other (non HMO) | Admitting: Oncology

## 2011-03-19 DIAGNOSIS — D649 Anemia, unspecified: Secondary | ICD-10-CM

## 2011-03-19 DIAGNOSIS — R634 Abnormal weight loss: Secondary | ICD-10-CM

## 2011-03-19 DIAGNOSIS — N289 Disorder of kidney and ureter, unspecified: Secondary | ICD-10-CM

## 2011-03-19 LAB — BASIC METABOLIC PANEL
CO2: 23 mEq/L (ref 19–32)
Glucose, Bld: 234 mg/dL — ABNORMAL HIGH (ref 70–99)
Potassium: 4.2 mEq/L (ref 3.5–5.3)
Sodium: 138 mEq/L (ref 135–145)

## 2011-03-19 LAB — CBC WITH DIFFERENTIAL/PLATELET
Eosinophils Absolute: 0.1 10*3/uL (ref 0.0–0.5)
MONO#: 0.5 10*3/uL (ref 0.1–0.9)
MONO%: 7.1 % (ref 0.0–14.0)
NEUT#: 4.3 10*3/uL (ref 1.5–6.5)
RBC: 3.46 10*6/uL — ABNORMAL LOW (ref 4.20–5.82)
RDW: 13.7 % (ref 11.0–14.6)
WBC: 6.3 10*3/uL (ref 4.0–10.3)

## 2011-04-09 ENCOUNTER — Ambulatory Visit: Payer: Managed Care, Other (non HMO) | Admitting: Oncology

## 2011-04-09 ENCOUNTER — Ambulatory Visit (HOSPITAL_BASED_OUTPATIENT_CLINIC_OR_DEPARTMENT_OTHER): Payer: Managed Care, Other (non HMO) | Admitting: Nurse Practitioner

## 2011-04-09 ENCOUNTER — Other Ambulatory Visit: Payer: Self-pay | Admitting: Oncology

## 2011-04-09 ENCOUNTER — Other Ambulatory Visit (HOSPITAL_BASED_OUTPATIENT_CLINIC_OR_DEPARTMENT_OTHER): Payer: Managed Care, Other (non HMO) | Admitting: Lab

## 2011-04-09 VITALS — BP 143/79 | HR 61 | Temp 98.0°F | Wt 199.3 lb

## 2011-04-09 DIAGNOSIS — R634 Abnormal weight loss: Secondary | ICD-10-CM

## 2011-04-09 DIAGNOSIS — D649 Anemia, unspecified: Secondary | ICD-10-CM

## 2011-04-09 DIAGNOSIS — N289 Disorder of kidney and ureter, unspecified: Secondary | ICD-10-CM

## 2011-04-09 LAB — CBC WITH DIFFERENTIAL/PLATELET
BASO%: 0.9 % (ref 0.0–2.0)
Basophils Absolute: 0.1 10*3/uL (ref 0.0–0.1)
Eosinophils Absolute: 0.1 10*3/uL (ref 0.0–0.5)
HCT: 31.5 % — ABNORMAL LOW (ref 38.4–49.9)
HGB: 10.4 g/dL — ABNORMAL LOW (ref 13.0–17.1)
LYMPH%: 30.1 % (ref 14.0–49.0)
MCHC: 33.1 g/dL (ref 32.0–36.0)
MONO#: 0.4 10*3/uL (ref 0.1–0.9)
NEUT%: 59.4 % (ref 39.0–75.0)
Platelets: 105 10*3/uL — ABNORMAL LOW (ref 140–400)
WBC: 5.5 10*3/uL (ref 4.0–10.3)

## 2011-04-09 NOTE — Progress Notes (Signed)
OFFICE PROGRESS NOTE  Interval history:  [name] is a 55 year old man with a normocytic anemia. Hemoglobin on 03/19/2011 returned at 9.9. He received Procrit 40,000 units. He is seen today for scheduled followup.  [name] has noted improvement in his energy level since the Procrit injection approximately 3 weeks ago. He denies shortness of breath or chest pain. He has intermittent mild nausea. In the past he has attributed the nausea to his medications.   Objective: Blood pressure 143/79, pulse 61, temperature 98 F (36.7 C), temperature source Oral, weight 199 lb 4.8 oz (90.402 kg).  Oropharynx is without thrush or ulceration. Lungs are clear. Regular cardiac rhythm. Abdomen is soft and nontender. No organomegaly. Trace lower leg edema bilaterally. Calves soft and nontender.   Lab Results: Hemoglobin 10.4 white count 5.5 absolute neutrophil count 3.3 platelet count 105,000.  Labs 03/19/2011 hemoglobin 9.9 white count 6.3 absolute neutrophil count 4.3 platelet count 145,000 BUN 51 creatinine 3.22    Studies/Results: No results found.  Medications: I have reviewed the patient's current medications.  Assessment/Plan:  1. Normocytic anemia, status post a nondiagnostic bone marrow biopsy, 10/10/2010. a. Status post 3 weeks of Procrit therapy with significant improvement in the anemia (10/29/2010, 11/05/2010 and 11/12/2010). b. He received a Procrit injection on 03/19/2011 when the hemoglobin returned at 9.9. 2. 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 3. Renal insufficiency, status post a biopsy, 11/09/2010, confirming glomerulosclerosis and arteriosclerosis. He continues followup with Dr. Lorrene [name]. 4. Diabetes. 5. Hypertension. 6. Elevated free kappa and lambda light chains. 7. Mild thrombocytopenia, stable.   Disposition.  [name] hemoglobin declined to 9.9 on 03/19/2011. He received Procrit 40,000 units with slight improvement in the hemoglobin on today's lab. He  will return in 3 weeks for a followup CBC and possible injection. We will continue to check labs on a 3 week schedule thereafter with goal hemoglobin in the 10-11 range. [name] will return for a followup visit with Dr. Benay [name] in February 2013. He will contact the office in the interim with any problems.  Plan reviewed with Dr. Benay [name].   Ned Card ANP/GNP-BC

## 2011-04-30 ENCOUNTER — Other Ambulatory Visit (HOSPITAL_BASED_OUTPATIENT_CLINIC_OR_DEPARTMENT_OTHER): Payer: Managed Care, Other (non HMO) | Admitting: Lab

## 2011-04-30 DIAGNOSIS — N289 Disorder of kidney and ureter, unspecified: Secondary | ICD-10-CM

## 2011-04-30 DIAGNOSIS — D649 Anemia, unspecified: Secondary | ICD-10-CM

## 2011-04-30 LAB — CBC WITH DIFFERENTIAL/PLATELET
BASO%: 0.4 % (ref 0.0–2.0)
EOS%: 2.4 % (ref 0.0–7.0)
HCT: 30.1 % — ABNORMAL LOW (ref 38.4–49.9)
LYMPH%: 30 % (ref 14.0–49.0)
MCH: 29.8 pg (ref 27.2–33.4)
MCHC: 34.9 g/dL (ref 32.0–36.0)
MCV: 85.5 fL (ref 79.3–98.0)
MONO#: 0.6 10*3/uL (ref 0.1–0.9)
MONO%: 8.3 % (ref 0.0–14.0)
NEUT%: 58.9 % (ref 39.0–75.0)
Platelets: 133 10*3/uL — ABNORMAL LOW (ref 140–400)
RBC: 3.52 10*6/uL — ABNORMAL LOW (ref 4.20–5.82)
nRBC: 0 % (ref 0–0)

## 2011-05-02 ENCOUNTER — Other Ambulatory Visit: Payer: Self-pay

## 2011-05-02 DIAGNOSIS — Z01810 Encounter for preprocedural cardiovascular examination: Secondary | ICD-10-CM

## 2011-05-02 DIAGNOSIS — N184 Chronic kidney disease, stage 4 (severe): Secondary | ICD-10-CM

## 2011-05-22 ENCOUNTER — Encounter: Payer: Self-pay | Admitting: *Deleted

## 2011-05-22 ENCOUNTER — Telehealth: Payer: Self-pay | Admitting: *Deleted

## 2011-05-22 ENCOUNTER — Other Ambulatory Visit: Payer: Self-pay | Admitting: *Deleted

## 2011-05-22 ENCOUNTER — Other Ambulatory Visit (HOSPITAL_BASED_OUTPATIENT_CLINIC_OR_DEPARTMENT_OTHER): Payer: Managed Care, Other (non HMO) | Admitting: Lab

## 2011-05-22 DIAGNOSIS — D649 Anemia, unspecified: Secondary | ICD-10-CM

## 2011-05-22 LAB — CBC WITH DIFFERENTIAL/PLATELET
BASO%: 0.3 % (ref 0.0–2.0)
Basophils Absolute: 0 10*3/uL (ref 0.0–0.1)
EOS%: 1.4 % (ref 0.0–7.0)
HGB: 10.5 g/dL — ABNORMAL LOW (ref 13.0–17.1)
MCH: 29.4 pg (ref 27.2–33.4)
MCHC: 35 g/dL (ref 32.0–36.0)
MCV: 84 fL (ref 79.3–98.0)
MONO%: 9.5 % (ref 0.0–14.0)
RDW: 12.4 % (ref 11.0–14.6)

## 2011-05-22 NOTE — Telephone Encounter (Signed)
[name] called requesting Shanon Brow see Dr. Benay Spice today when he comes in for lab at 4:00pm.   Reports he's been "vomiting off and on the past few weeks.  Spent Christmas day in bed and this keeps happening.  Informed her MD is not in today and to call early am for interventions.  This office following patient for anemia.  This nurse instructed spouse to call PCP with this request as a "GI bug" has been reported in the past few weeks.

## 2011-05-22 NOTE — Progress Notes (Signed)
Patient reported feeling weak and nauseated. Was instructed to contact his PCP or Dr. Lorrene Reid. CBC results stable. Symptoms probably related to his renal issues. Forwarded copy of labs to Dr. Jamal Maes. Noted he is scheduled for avf for future dialysis on 05/29/11.

## 2011-05-23 ENCOUNTER — Encounter: Payer: Self-pay | Admitting: Vascular Surgery

## 2011-05-23 ENCOUNTER — Telehealth: Payer: Self-pay | Admitting: *Deleted

## 2011-05-27 ENCOUNTER — Telehealth: Payer: Self-pay | Admitting: *Deleted

## 2011-05-27 ENCOUNTER — Telehealth: Payer: Self-pay | Admitting: Oncology

## 2011-05-27 NOTE — Telephone Encounter (Signed)
Spoke with patient who reports this fatigue over past 2-3 weeks. Increase in nausea with emesis almost every morning. Feels "lightheaded" at times to point of almost faint with sudden position changes. Bowels moving well and weight is stable. He did not make contact with his PCP last week. Made him aware that PCP or renal MD may be best to assess these symptoms,but will make MD aware of his call and request to be seen.

## 2011-05-27 NOTE — Telephone Encounter (Signed)
Notified patient that Dr. Benay Spice agrees that his PCP is best avenue to begin with to assess his symptoms.

## 2011-05-27 NOTE — Telephone Encounter (Signed)
Patient called asking to be seen by Dr. Benay Spice. Reports daily nausea, fatigue and muscle soreness. Hgb 10.5 on 05/22/11. At that time he was instructed to call his PCP.

## 2011-05-27 NOTE — Telephone Encounter (Signed)
Pt called in requesting a sooner appt with dr Benay Spice than feb. Transferred the call over to the desk nurse for further instructions.

## 2011-05-28 ENCOUNTER — Encounter: Payer: Self-pay | Admitting: Vascular Surgery

## 2011-05-29 ENCOUNTER — Ambulatory Visit (INDEPENDENT_AMBULATORY_CARE_PROVIDER_SITE_OTHER): Payer: Managed Care, Other (non HMO) | Admitting: Vascular Surgery

## 2011-05-29 ENCOUNTER — Encounter: Payer: Self-pay | Admitting: Vascular Surgery

## 2011-05-29 ENCOUNTER — Other Ambulatory Visit (INDEPENDENT_AMBULATORY_CARE_PROVIDER_SITE_OTHER): Payer: Managed Care, Other (non HMO) | Admitting: *Deleted

## 2011-05-29 VITALS — BP 155/96 | HR 65 | Resp 14 | Ht 72.0 in | Wt 192.0 lb

## 2011-05-29 DIAGNOSIS — Z01810 Encounter for preprocedural cardiovascular examination: Secondary | ICD-10-CM

## 2011-05-29 DIAGNOSIS — N186 End stage renal disease: Secondary | ICD-10-CM | POA: Insufficient documentation

## 2011-05-29 DIAGNOSIS — N184 Chronic kidney disease, stage 4 (severe): Secondary | ICD-10-CM

## 2011-05-29 NOTE — Progress Notes (Signed)
[facility] [place]  Patient name: Paul [name] MRN: [id number] DOB: [birth year] Sex: male  REASON FOR CONSULT: Evaluate for hemodialysis access. Referred by Dr. Lorrene Reid.  HPI: Paul [name] is a 56 y.o. male who is right-handed who has chronic kidney disease secondary to diabetic nephropathy. We were asked to place a hemodialysis access. I have reviewed his notes from Dr. Lorrene Reid is office. He had fairly rapid progression of his renal insufficiency. Renal biopsy suggested fairly severe disease. Patient also has a history of hypertension. Recently he had problems with dyspnea on exertion, fatigue, and occasional nausea.  Past Medical History  Diagnosis Date  . Diabetic neuropathy   . Diabetes mellitus   . Hypertension   . Anemia   . Cancer     History of Duodenal Cancer  . Chronic kidney disease     Pt is not eligible for transplant per [facility]    Family History  Problem Relation Age of Onset  . Cancer Mother   . Cancer Father     prostate cancer  . Heart disease Maternal Grandfather   . Stroke Paternal Grandmother     SOCIAL HISTORY: History  Substance Use Topics  . Smoking status: Never Smoker   . Smokeless tobacco: Not on file  . Alcohol Use: 0.5 oz/week    1 drink(s) per week    No Known Allergies  Current Outpatient Prescriptions  Medication Sig Dispense Refill  . amLODipine (NORVASC) 10 MG tablet Take 10 mg by mouth daily.        [name] aspirin 81 MG tablet Take 81 mg by mouth daily.        [name] atorvastatin (LIPITOR) 10 MG tablet Take 10 mg by mouth daily.        . calcitRIOL (ROCALTROL) 0.25 MCG capsule Take 0.25 mcg by mouth 3 (three) times a week.        [name] Epoetin Alfa (PROCRIT IJ) Inject as directed.        . ferrous sulfate 325 (65 FE) MG tablet Take 325 mg by mouth 2 (two) times daily.        . furosemide (LASIX) 40 MG tablet Take 40 mg by mouth 2 (two) times daily.       . insulin glargine (LANTUS) 100 UNIT/ML injection Inject 30 Units into the  skin daily. 30-32 units qam       . labetalol (NORMODYNE) 200 MG tablet Take 200 mg by mouth 2 (two) times daily.        . Vitamin D, Ergocalciferol, (DRISDOL) 50000 UNITS CAPS Take 50,000 Units by mouth every 7 (seven) days.        [name] acetaminophen (TYLENOL) 325 MG tablet Take 650 mg by mouth every 6 (six) hours as needed.          REVIEW OF SYSTEMS: Valu.Nieves ] denotes positive finding; [  ] denotes negative finding CARDIOVASCULAR:  [ ]  chest pain   [ ]  chest pressure   [ ]  palpitations   [ ]  orthopnea   Valu.Nieves ] dyspnea on exertion   [ ]  claudication   [ ]  rest pain   [ ]  DVT   [ ]  phlebitis PULMONARY:   [ ]  productive cough   [ ]  asthma   [ ]  wheezing NEUROLOGIC:   [ ]  weakness  [ ]  paresthesias  [ ]  aphasia  [ ]  amaurosis  Valu.Nieves ] dizziness HEMATOLOGIC:   [ ]  bleeding problems   [ ]  clotting disorders  MUSCULOSKELETAL:  [ ]  joint pain   [ ]  joint swelling Valu.Nieves ] leg swelling GASTROINTESTINAL: [ ]   blood in stool  [ ]   Hematemesis Valu.Nieves ] Nausea GENITOURINARY:  [ ]   dysuria  [ ]   hematuria PSYCHIATRIC:  [ ]  history of major depression INTEGUMENTARY:  [ ]  rashes  [ ]  ulcers CONSTITUTIONAL:  [ ]  fever   [ ]  chills  PHYSICAL EXAM: Filed Vitals:   05/29/11 1118  BP: 155/96  Pulse: 65  Resp: 14  Height: 6' (1.829 m)  Weight: 192 lb (87.091 kg)  SpO2: 98%   Body mass index is 26.04 kg/(m^2). GENERAL: The patient is a well-nourished male, in no acute distress. The vital signs are documented above. CARDIOVASCULAR: There is a regular rate and rhythm without significant murmur appreciated. I do not detect carotid bruits. He has palpable radial pulses and brachial pulses bilaterally. He has palpable femoral pulses bilaterally. PULMONARY: There is good air exchange bilaterally without wheezing or rales. ABDOMEN: Soft and non-tender with normal pitched bowel sounds.  MUSCULOSKELETAL: There are no major deformities or cyanosis. NEUROLOGIC: No focal weakness or paresthesias are detected. SKIN: There are no  ulcers or rashes noted. PSYCHIATRIC: The patient has a normal affect.  DATA:  Lab Results  Component Value Date   WBC 7.8 05/22/2011   HGB 10.5* 05/22/2011   HCT 30.0* 05/22/2011   MCV 84.0 05/22/2011   PLT 130* 05/22/2011   Lab Results  Component Value Date   NA 138 03/19/2011   K 4.2 03/19/2011   CL 106 03/19/2011   CO2 23 03/19/2011   Lab Results  Component Value Date   CREATININE 3.22* 03/19/2011   DATA: Have independently interpreted his vein mapping which shows that his forearm cephalic vein is fairly small. His upper arm cephalic vein looks more reasonable in size. The basilic vein on the left looks reasonable in size although it somewhat small near the antecubital level.  MEDICAL ISSUES: Based on his vein mapping in my exam including interrogation with the Sono SIte, I think his best option for an AV fistula is a left brachiocephalic fistula.I have explained the indications for placement of an AV fistula or AV graft. I've explained that if at all possible we will place an AV fistula.  I have reviewed the risks of placement of an AV fistula including but not limited to: failure of the fistula to mature, need for subsequent interventions, and thrombosis. In addition I have reviewed the potential complications of placement of an AV graft. These risks include, but are not limited to, graft thrombosis, graft infection, wound healing problems, bleeding, arm swelling, and steal syndrome. All the patient's questions were answered and they are agreeable to proceed with surgery. The surgery is scheduled for 06/07/2011.    [place] [facility] [place] [facility]per: [phone]

## 2011-05-30 NOTE — Telephone Encounter (Signed)
Opened in error

## 2011-06-04 ENCOUNTER — Encounter (HOSPITAL_COMMUNITY): Payer: Self-pay | Admitting: Respiratory Therapy

## 2011-06-05 ENCOUNTER — Other Ambulatory Visit: Payer: Self-pay

## 2011-06-05 NOTE — Procedures (Unsigned)
CEPHALIC VEIN MAPPING  INDICATION:  Chronic kidney disease, stage IV; preop vein mapping for fistula placement.  HISTORY:  EXAM: The right cephalic and basilic veins are not evaluated.  The left cephalic vein is compressible.  Diameter measurements range from 0.32 to 0.20.  The left basilic vein is compressible.  Diameter measurements range from 0.47 to 0.19.  See attached worksheet for all measurements.  IMPRESSION:  Patent left cephalic and basilic veins with diameter measurements as described above.  ___________________________________________ Judeth Cornfield. Scot Dock, M.D.  LT/MEDQ  D:  05/29/2011  T:  05/29/2011  Job:  [id number]

## 2011-06-06 ENCOUNTER — Other Ambulatory Visit: Payer: Self-pay

## 2011-06-06 ENCOUNTER — Encounter (HOSPITAL_COMMUNITY)
Admission: RE | Admit: 2011-06-06 | Discharge: 2011-06-06 | Disposition: A | Discharge status: Home / Self Care | Payer: Managed Care, Other (non HMO) | Source: Ambulatory Visit | Attending: Anesthesiology | Admitting: Anesthesiology

## 2011-06-06 ENCOUNTER — Encounter (HOSPITAL_COMMUNITY): Payer: Self-pay

## 2011-06-06 ENCOUNTER — Encounter (HOSPITAL_COMMUNITY)
Admission: RE | Admit: 2011-06-06 | Discharge: 2011-06-06 | Disposition: A | Discharge status: Home / Self Care | Payer: Managed Care, Other (non HMO) | Source: Ambulatory Visit | Attending: Vascular Surgery | Admitting: Vascular Surgery

## 2011-06-06 HISTORY — DX: Unspecified fracture of shaft of humerus, left arm, initial encounter for closed fracture: S42.302A

## 2011-06-06 LAB — SURGICAL PCR SCREEN: Staphylococcus aureus: NEGATIVE

## 2011-06-06 MED ORDER — DEXTROSE 5 % IV SOLN
1.5000 g | INTRAVENOUS | Status: AC
  Administered 2011-06-07: 1.5 g via INTRAVENOUS
  Filled 2011-06-06: qty 1.5

## 2011-06-06 NOTE — Pre-Procedure Instructions (Signed)
[address] FINGERHUT  06/06/2011   Your procedure is scheduled on:  Friday,Jan 18 Report to [facility] [place].  Call this number if you have problems the morning of surgery: [phone]   Remember:   Do not eat food:After Midnight.  May have clear liquids: up to 4 Hours before arrival.  Clear liquids include soda, tea, black coffee, apple or grape juice, broth.  Take these medicines the morning of surgery with A SIP OF WATER: *None**   Do not wear jewelry, make-up or nail polish.  Do not wear lotions, powders, or perfumes. You may wear deodorant.  Do not shave 48 hours prior to surgery.  Do not bring valuables to the hospital.  Contacts, dentures or bridgework may not be worn into surgery.  Leave suitcase in the car. After surgery it may be brought to your room.  For patients admitted to the hospital, checkout time is 11:00 AM the day of discharge.   Patients discharged the day of surgery will not be allowed to drive home.  Name and phone number of your driver: Friend  Special Instructions: CHG Shower Use Special Wash: 1/2 bottle night before surgery and 1/2 bottle morning of surgery.   Please read over the following fact sheets that you were given: Pain Booklet, Coughing and Deep Breathing, MRSA Information and Surgical Site Infection Prevention

## 2011-06-06 NOTE — Consult Note (Signed)
Anesthesia:  Patient is a 56 year old male scheduled for a creation of a left AVF on 06/07/11.  History includes DM2, anemia, HTN, intermittent tremor, duodenal cancer, hx of Whipple procedure, CKD Stage IV with anticipated need for HD in the near future.  Today's EKG showed NSR, possible septal infarct.  I don't think his EKG has significantly changed since 03/19/01.  Clinical correlation on the day of surgery.  06/06/11 CXR shows emphysematous changes without infiltrate.   For labs on the day of surgery.

## 2011-06-07 ENCOUNTER — Encounter (HOSPITAL_COMMUNITY)
Admission: RE | Disposition: A | Discharge status: Home / Self Care | Payer: Self-pay | Source: Ambulatory Visit | Attending: Vascular Surgery

## 2011-06-07 ENCOUNTER — Encounter (HOSPITAL_COMMUNITY): Payer: Self-pay | Admitting: *Deleted

## 2011-06-07 ENCOUNTER — Ambulatory Visit (HOSPITAL_COMMUNITY): Payer: Managed Care, Other (non HMO) | Admitting: Vascular Surgery

## 2011-06-07 ENCOUNTER — Encounter (HOSPITAL_COMMUNITY): Payer: Self-pay | Admitting: Vascular Surgery

## 2011-06-07 ENCOUNTER — Ambulatory Visit (HOSPITAL_COMMUNITY)
Admission: RE | Admit: 2011-06-07 | Discharge: 2011-06-07 | Disposition: A | Discharge status: Home / Self Care | Payer: Managed Care, Other (non HMO) | Source: Ambulatory Visit | Attending: Vascular Surgery | Admitting: Vascular Surgery

## 2011-06-07 ENCOUNTER — Encounter (HOSPITAL_COMMUNITY): Payer: Self-pay | Admitting: Certified Registered"

## 2011-06-07 DIAGNOSIS — N186 End stage renal disease: Secondary | ICD-10-CM | POA: Insufficient documentation

## 2011-06-07 DIAGNOSIS — E1129 Type 2 diabetes mellitus with other diabetic kidney complication: Secondary | ICD-10-CM | POA: Insufficient documentation

## 2011-06-07 DIAGNOSIS — Z01810 Encounter for preprocedural cardiovascular examination: Secondary | ICD-10-CM | POA: Insufficient documentation

## 2011-06-07 DIAGNOSIS — E1149 Type 2 diabetes mellitus with other diabetic neurological complication: Secondary | ICD-10-CM | POA: Insufficient documentation

## 2011-06-07 DIAGNOSIS — I120 Hypertensive chronic kidney disease with stage 5 chronic kidney disease or end stage renal disease: Secondary | ICD-10-CM | POA: Insufficient documentation

## 2011-06-07 DIAGNOSIS — D649 Anemia, unspecified: Secondary | ICD-10-CM | POA: Insufficient documentation

## 2011-06-07 DIAGNOSIS — Z794 Long term (current) use of insulin: Secondary | ICD-10-CM | POA: Insufficient documentation

## 2011-06-07 DIAGNOSIS — E1142 Type 2 diabetes mellitus with diabetic polyneuropathy: Secondary | ICD-10-CM | POA: Insufficient documentation

## 2011-06-07 DIAGNOSIS — Z01818 Encounter for other preprocedural examination: Secondary | ICD-10-CM | POA: Insufficient documentation

## 2011-06-07 DIAGNOSIS — Z8509 Personal history of malignant neoplasm of other digestive organs: Secondary | ICD-10-CM | POA: Insufficient documentation

## 2011-06-07 HISTORY — PX: AV FISTULA PLACEMENT: SHX1204

## 2011-06-07 LAB — POCT I-STAT 4, (NA,K, GLUC, HGB,HCT)
Glucose, Bld: 54 mg/dL — ABNORMAL LOW (ref 70–99)
HCT: 28 % — ABNORMAL LOW (ref 39.0–52.0)
Hemoglobin: 9.5 g/dL — ABNORMAL LOW (ref 13.0–17.0)
Hemoglobin: 8.8 g/dL — ABNORMAL LOW (ref 13.0–17.0)
Potassium: 4.8 mEq/L (ref 3.5–5.1)
Sodium: 147 mEq/L — ABNORMAL HIGH (ref 135–145)

## 2011-06-07 LAB — GLUCOSE, CAPILLARY
Glucose-Capillary: 52 mg/dL — ABNORMAL LOW (ref 70–99)
Glucose-Capillary: 99 mg/dL (ref 70–99)
Glucose-Capillary: 125 mg/dL — ABNORMAL HIGH (ref 70–99)

## 2011-06-07 SURGERY — ARTERIOVENOUS (AV) FISTULA CREATION
Anesthesia: Monitor Anesthesia Care | Site: Arm Lower | Laterality: Left | Wound class: Clean

## 2011-06-07 MED ORDER — MORPHINE SULFATE 2 MG/ML IJ SOLN: 0.0500 mg/kg | INTRAMUSCULAR | Status: DC | PRN

## 2011-06-07 MED ORDER — DEXTROSE 50 % IV SOLN
12.5000 g | Freq: Once | INTRAVENOUS | Status: AC
  Administered 2011-06-07: 12.5 g via INTRAVENOUS
  Filled 2011-06-07: qty 50

## 2011-06-07 MED ORDER — LIDOCAINE HCL (PF) 1 % IJ SOLN
INTRAMUSCULAR | Status: DC | PRN
  Administered 2011-06-07: 13 mL

## 2011-06-07 MED ORDER — SODIUM CHLORIDE 0.9 % IR SOLN
Status: DC | PRN
  Administered 2011-06-07: 08:00:00

## 2011-06-07 MED ORDER — PROPOFOL 10 MG/ML IV EMUL
INTRAVENOUS | Status: DC | PRN
  Administered 2011-06-07: 50 ug/kg/min via INTRAVENOUS

## 2011-06-07 MED ORDER — OXYCODONE-ACETAMINOPHEN 5-325 MG PO TABS: 1.0000 | ORAL_TABLET | ORAL | Status: AC | PRN

## 2011-06-07 MED ORDER — MIDAZOLAM HCL 5 MG/5ML IJ SOLN
INTRAMUSCULAR | Status: DC | PRN
  Administered 2011-06-07 (×2): 1 mg via INTRAVENOUS

## 2011-06-07 MED ORDER — HEPARIN SODIUM (PORCINE) 1000 UNIT/ML IJ SOLN
INTRAMUSCULAR | Status: DC | PRN
  Administered 2011-06-07: 7 mL via INTRAVENOUS

## 2011-06-07 MED ORDER — DEXTROSE 50 % IV SOLN
INTRAVENOUS | Status: AC
  Administered 2011-06-07: 12.5 g via INTRAVENOUS
  Filled 2011-06-07: qty 50

## 2011-06-07 MED ORDER — SODIUM CHLORIDE 0.9 % IV SOLN: INTRAVENOUS | Status: DC

## 2011-06-07 MED ORDER — METOCLOPRAMIDE HCL 5 MG/ML IJ SOLN: 10.0000 mg | Freq: Once | INTRAMUSCULAR | Status: DC | PRN

## 2011-06-07 MED ORDER — FENTANYL CITRATE 0.05 MG/ML IJ SOLN: 25.0000 ug | INTRAMUSCULAR | Status: DC | PRN

## 2011-06-07 MED ORDER — 0.9 % SODIUM CHLORIDE (POUR BTL) OPTIME
TOPICAL | Status: DC | PRN
  Administered 2011-06-07: 1000 mL

## 2011-06-07 MED ORDER — DEXTROSE-NACL 5-0.45 % IV SOLN
INTRAVENOUS | Status: DC | PRN
  Administered 2011-06-07: 07:00:00 via INTRAVENOUS

## 2011-06-07 MED ORDER — PROTAMINE SULFATE 10 MG/ML IV SOLN
INTRAVENOUS | Status: DC | PRN
  Administered 2011-06-07: 10 mg via INTRAVENOUS
  Administered 2011-06-07: 30 mg via INTRAVENOUS

## 2011-06-07 MED ORDER — SODIUM CHLORIDE 0.9 % IV SOLN
INTRAVENOUS | Status: DC
  Administered 2011-06-07: 06:00:00 via INTRAVENOUS

## 2011-06-07 MED ORDER — FENTANYL CITRATE 0.05 MG/ML IJ SOLN
INTRAMUSCULAR | Status: DC | PRN
  Administered 2011-06-07 (×2): 25 ug via INTRAVENOUS

## 2011-06-07 MED FILL — Heparin Sodium (Porcine) Inj 1000 Unit/ML: INTRAMUSCULAR | Qty: 7 | Status: AC

## 2011-06-07 SURGICAL SUPPLY — 38 items
ADH SKN CLS APL DERMABOND .7 (GAUZE/BANDAGES/DRESSINGS) ×1
CANISTER SUCTION 2500CC (MISCELLANEOUS) ×2 IMPLANT
CLIP TI MEDIUM 6 (CLIP) ×2 IMPLANT
CLIP TI WIDE RED SMALL 6 (CLIP) ×2 IMPLANT
CLOTH BEACON ORANGE TIMEOUT ST (SAFETY) ×2 IMPLANT
COVER PROBE W GEL 5X96 (DRAPES) ×1 IMPLANT
COVER SURGICAL LIGHT HANDLE (MISCELLANEOUS) ×4 IMPLANT
DECANTER SPIKE VIAL GLASS SM (MISCELLANEOUS) ×1 IMPLANT
DERMABOND ADVANCED (GAUZE/BANDAGES/DRESSINGS) ×1
DERMABOND ADVANCED .7 DNX12 (GAUZE/BANDAGES/DRESSINGS) ×1 IMPLANT
DRAIN PENROSE 1/2X12 LTX STRL (WOUND CARE) IMPLANT
ELECT REM PT RETURN 9FT ADLT (ELECTROSURGICAL) ×2
ELECTRODE REM PT RTRN 9FT ADLT (ELECTROSURGICAL) ×1 IMPLANT
GLOVE BIO SURGEON STRL SZ7.5 (GLOVE) ×2 IMPLANT
GLOVE BIOGEL PI IND STRL 6.5 (GLOVE) IMPLANT
GLOVE BIOGEL PI IND STRL 7.5 (GLOVE) ×1 IMPLANT
GLOVE BIOGEL PI INDICATOR 6.5 (GLOVE) ×1
GLOVE BIOGEL PI INDICATOR 7.5 (GLOVE) ×3
GLOVE EXAM NITRILE MD LF STRL (GLOVE) ×1 IMPLANT
GLOVE SS BIOGEL STRL SZ 7 (GLOVE) IMPLANT
GLOVE SUPERSENSE BIOGEL SZ 7 (GLOVE) ×1
GLOVE SURG SS PI 7.5 STRL IVOR (GLOVE) ×1 IMPLANT
GOWN STRL NON-REIN LRG LVL3 (GOWN DISPOSABLE) ×5 IMPLANT
KIT BASIN OR (CUSTOM PROCEDURE TRAY) ×2 IMPLANT
KIT ROOM TURNOVER OR (KITS) ×2 IMPLANT
NS IRRIG 1000ML POUR BTL (IV SOLUTION) ×2 IMPLANT
PACK CV ACCESS (CUSTOM PROCEDURE TRAY) ×2 IMPLANT
PAD ARMBOARD 7.5X6 YLW CONV (MISCELLANEOUS) ×4 IMPLANT
SPONGE GAUZE 4X4 12PLY (GAUZE/BANDAGES/DRESSINGS) ×2 IMPLANT
SPONGE SURGIFOAM ABS GEL 100 (HEMOSTASIS) IMPLANT
SUT PROLENE 6 0 BV (SUTURE) ×2 IMPLANT
SUT VIC AB 3-0 SH 27 (SUTURE) ×2
SUT VIC AB 3-0 SH 27X BRD (SUTURE) ×1 IMPLANT
SUT VICRYL 4-0 PS2 18IN ABS (SUTURE) ×2 IMPLANT
TOWEL OR 17X24 6PK STRL BLUE (TOWEL DISPOSABLE) ×2 IMPLANT
TOWEL OR 17X26 10 PK STRL BLUE (TOWEL DISPOSABLE) ×2 IMPLANT
UNDERPAD 30X30 INCONTINENT (UNDERPADS AND DIAPERS) ×2 IMPLANT
WATER STERILE IRR 1000ML POUR (IV SOLUTION) ×2 IMPLANT

## 2011-06-07 NOTE — H&P (View-Only) (Signed)
[facility]ist of [place]  Patient name: Paul [name] MRN: [id number] DOB: 01/30/1956 Sex: male  REASON FOR CONSULT: Evaluate for hemodialysis access. Referred by Dr. Lorrene Reid.  HPI: Paul [name] is a 56 y.o. male who is right-handed who has chronic kidney disease secondary to diabetic nephropathy. We were asked to place a hemodialysis access. I have reviewed his notes from Dr. Lorrene Reid is office. He had fairly rapid progression of his renal insufficiency. Renal biopsy suggested fairly severe disease. Patient also has a history of hypertension. Recently he had problems with dyspnea on exertion, fatigue, and occasional nausea.  Past Medical History  Diagnosis Date  . Diabetic neuropathy   . Diabetes mellitus   . Hypertension   . Anemia   . Cancer     History of Duodenal Cancer  . Chronic kidney disease     Pt is not eligible for transplant per [facility]    Family History  Problem Relation Age of Onset  . Cancer Mother   . Cancer Father     prostate cancer  . Heart disease Maternal Grandfather   . Stroke Paternal Grandmother     SOCIAL HISTORY: History  Substance Use Topics  . Smoking status: Never Smoker   . Smokeless tobacco: Not on file  . Alcohol Use: 0.5 oz/week    1 drink(s) per week    No Known Allergies  Current Outpatient Prescriptions  Medication Sig Dispense Refill  . amLODipine (NORVASC) 10 MG tablet Take 10 mg by mouth daily.        [name] aspirin 81 MG tablet Take 81 mg by mouth daily.        [name] atorvastatin (LIPITOR) 10 MG tablet Take 10 mg by mouth daily.        . calcitRIOL (ROCALTROL) 0.25 MCG capsule Take 0.25 mcg by mouth 3 (three) times a week.        [name] Epoetin Alfa (PROCRIT IJ) Inject as directed.        . ferrous sulfate 325 (65 FE) MG tablet Take 325 mg by mouth 2 (two) times daily.        . furosemide (LASIX) 40 MG tablet Take 40 mg by mouth 2 (two) times daily.       . insulin glargine (LANTUS) 100 UNIT/ML injection Inject 30 Units into the  skin daily. 30-32 units qam       . labetalol (NORMODYNE) 200 MG tablet Take 200 mg by mouth 2 (two) times daily.        . Vitamin D, Ergocalciferol, (DRISDOL) 50000 UNITS CAPS Take 50,000 Units by mouth every 7 (seven) days.        [name] acetaminophen (TYLENOL) 325 MG tablet Take 650 mg by mouth every 6 (six) hours as needed.          REVIEW OF SYSTEMS: Valu.Nieves ] denotes positive finding; [  ] denotes negative finding CARDIOVASCULAR:  [ ]  chest pain   [ ]  chest pressure   [ ]  palpitations   [ ]  orthopnea   Valu.Nieves ] dyspnea on exertion   [ ]  claudication   [ ]  rest pain   [ ]  DVT   [ ]  phlebitis PULMONARY:   [ ]  productive cough   [ ]  asthma   [ ]  wheezing NEUROLOGIC:   [ ]  weakness  [ ]  paresthesias  [ ]  aphasia  [ ]  amaurosis  Valu.Nieves ] dizziness HEMATOLOGIC:   [ ]  bleeding problems   [ ]  clotting disorders  MUSCULOSKELETAL:  [ ]  joint pain   [ ]  joint swelling Valu.Nieves ] leg swelling GASTROINTESTINAL: [ ]   blood in stool  [ ]   Hematemesis Valu.Nieves ] Nausea GENITOURINARY:  [ ]   dysuria  [ ]   hematuria PSYCHIATRIC:  [ ]  history of major depression INTEGUMENTARY:  [ ]  rashes  [ ]  ulcers CONSTITUTIONAL:  [ ]  fever   [ ]  chills  PHYSICAL EXAM: Filed Vitals:   05/29/11 1118  BP: 155/96  Pulse: 65  Resp: 14  Height: 6' (1.829 m)  Weight: 192 lb (87.091 kg)  SpO2: 98%   Body mass index is 26.04 kg/(m^2). GENERAL: The patient is a well-nourished male, in no acute distress. The vital signs are documented above. CARDIOVASCULAR: There is a regular rate and rhythm without significant murmur appreciated. I do not detect carotid bruits. He has palpable radial pulses and brachial pulses bilaterally. He has palpable femoral pulses bilaterally. PULMONARY: There is good air exchange bilaterally without wheezing or rales. ABDOMEN: Soft and non-tender with normal pitched bowel sounds.  MUSCULOSKELETAL: There are no major deformities or cyanosis. NEUROLOGIC: No focal weakness or paresthesias are detected. SKIN: There are no  ulcers or rashes noted. PSYCHIATRIC: The patient has a normal affect.  DATA:  Lab Results  Component Value Date   WBC 7.8 05/22/2011   HGB 10.5* 05/22/2011   HCT 30.0* 05/22/2011   MCV 84.0 05/22/2011   PLT 130* 05/22/2011   Lab Results  Component Value Date   NA 138 03/19/2011   K 4.2 03/19/2011   CL 106 03/19/2011   CO2 23 03/19/2011   Lab Results  Component Value Date   CREATININE 3.22* 03/19/2011   DATA: Have independently interpreted his vein mapping which shows that his forearm cephalic vein is fairly small. His upper arm cephalic vein looks more reasonable in size. The basilic vein on the left looks reasonable in size although it somewhat small near the antecubital level.  MEDICAL ISSUES: Based on his vein mapping in my exam including interrogation with the Sono SIte, I think his best option for an AV fistula is a left brachiocephalic fistula.I have explained the indications for placement of an AV fistula or AV graft. I've explained that if at all possible we will place an AV fistula.  I have reviewed the risks of placement of an AV fistula including but not limited to: failure of the fistula to mature, need for subsequent interventions, and thrombosis. In addition I have reviewed the potential complications of placement of an AV graft. These risks include, but are not limited to, graft thrombosis, graft infection, wound healing problems, bleeding, arm swelling, and steal syndrome. All the patient's questions were answered and they are agreeable to proceed with surgery. The surgery is scheduled for 06/07/2011.    [place] [facility] [place] Beeper: [phone]

## 2011-06-07 NOTE — Op Note (Signed)
NAME: CORDARO RENSBERGER    MRN: [id number] DOB: August 18, 1955    DATE OF OPERATION: 06/07/2011  PREOP DIAGNOSIS: chronic kidney disease  POSTOP DIAGNOSIS: same  PROCEDURE: left brachiocephalic AV fistula  SURGEON: Judeth Cornfield. Scot Dock, MD, FACS  ASSIST: Gerri Lins PA  ANESTHESIA: local with sedation   EBL: minimal  INDICATIONS: Paul [name] is a 56 y.o. male who is not yet on dialysis. He is brought in for elective placement of an left arm AV fistula.  FINDINGS: 5 mm vein.  TECHNIQUE: The patient was brought to the operating room and sedated by anesthesia. The left upper extremity was prepped and draped in the usual sterile fashion. After the skin was anesthetized with 1% lidocaine a transverse incision was made above the antecubital level and through this incision the cephalic vein was dissected free. It was ligated distally and irrigated up nicely with heparinized saline. The brachial artery was dissected free beneath the fascia. The patient was heparinized. The brachial artery was clamped proximally and distally and a longitudinal arteriotomy was made. The vein was mobilized over and sewn end-to-side to the artery using continuous 6-0 Prolene suture. The completion was an excellent thrill in the fistula. 1 large competing branch was identified and clipped. There was a palpable left radial pulse. Heparin was partially reversed with protamine. The wounds closed the deep layer 3-0 Vicryl and the skin closed with 4-0 Vicryl. Dermabond was applied. The patient tolerated the procedure well and was transferred to the PACU in stable condition. All needle and sponge counts were correct.   [name], MD, FACS [facility]  DATE OF DICTATION:   06/07/2011

## 2011-06-07 NOTE — Addendum Note (Signed)
Addendum  created 06/07/11 1232 by Maeola Harman, CRNA   Modules edited:Anesthesia Medication Administration

## 2011-06-07 NOTE — Anesthesia Preprocedure Evaluation (Signed)
Anesthesia Evaluation  Patient identified by MRN, date of birth, ID band Patient awake    Reviewed: Allergy & Precautions, H&P , NPO status , Patient's Chart, lab work & pertinent test results, reviewed documented beta blocker date and time   Airway Mallampati: II TM Distance: >3 FB Neck ROM: full    Dental   Pulmonary shortness of breath and with exertion,          Cardiovascular hypertension, On Medications     Neuro/Psych Negative Neurological ROS  Negative Psych ROS   GI/Hepatic negative GI ROS, Neg liver ROS,   Endo/Other  Diabetes mellitus-, Type 1, Insulin Dependent  Renal/GU CRFRenal disease  Genitourinary negative   Musculoskeletal   Abdominal   Peds  Hematology negative hematology ROS (+)   Anesthesia Other Findings See surgeon's H&P   Reproductive/Obstetrics negative OB ROS                           Anesthesia Physical Anesthesia Plan  ASA: III  Anesthesia Plan: MAC   Post-op Pain Management:    Induction: Intravenous  Airway Management Planned: Simple Face Mask  Additional Equipment:   Intra-op Plan:   Post-operative Plan:   Informed Consent: I have reviewed the patients History and Physical, chart, labs and discussed the procedure including the risks, benefits and alternatives for the proposed anesthesia with the patient or authorized representative who has indicated his/her understanding and acceptance.     Plan Discussed with: CRNA and Surgeon  Anesthesia Plan Comments:         Anesthesia Quick Evaluation

## 2011-06-07 NOTE — Interval H&P Note (Signed)
History and Physical Interval Note:  06/07/2011 6:53 AM  Paul [name]  has presented today for surgery, with the diagnosis of End Staqe Renal Disease  The various methods of treatment have been discussed with the patient and family. After consideration of risks, benefits and other options for treatment, the patient has consented to: ARTERIOVENOUS (AV) FISTULA CREATION LEFT ARM. The patients' history has been reviewed, patient examined, no change in status, stable for surgery.  I have reviewed the patients' chart and labs.  Questions were answered to the patient's satisfaction.     [name] S

## 2011-06-07 NOTE — Anesthesia Postprocedure Evaluation (Signed)
Anesthesia Post Note  Patient: Paul [name]  Procedure(s) Performed:  ARTERIOVENOUS (AV) FISTULA CREATION  Anesthesia type: MAC  Patient location: PACU  Post pain: Pain level controlled  Post assessment: Patient's Cardiovascular Status Stable  Last Vitals:  Filed Vitals:   06/07/11 0620  BP: 156/93  Pulse: 63  Temp: 36.7 C  Resp: 20    Post vital signs: Reviewed and stable  Level of consciousness: alert  Complications: No apparent anesthesia complications

## 2011-06-07 NOTE — Anesthesia Procedure Notes (Signed)
Procedure Name: MAC Date/Time: 06/07/2011 7:30 AM Performed by: Maeola Harman Pre-anesthesia Checklist: Patient identified, Emergency Drugs available, Suction available, Patient being monitored and Timeout performed Patient Re-evaluated:Patient Re-evaluated prior to inductionOxygen Delivery Method: Simple face mask Preoxygenation: Pre-oxygenation with 100% oxygen Intubation Type: IV induction

## 2011-06-07 NOTE — Progress Notes (Signed)
Pt reports he has some money and a cell phone with him. Offered to send it to be locked up. Pt refused informed him that [facility] could not be responsible for either if it was not locked up. Pt accepted risk.

## 2011-06-07 NOTE — Transfer of Care (Signed)
Immediate Anesthesia Transfer of Care Note  Patient: Paul [name]  Procedure(s) Performed:  ARTERIOVENOUS (AV) FISTULA CREATION  Patient Location: PACU  Anesthesia Type: General  Level of Consciousness: awake  Airway & Oxygen Therapy: Patient Spontanous Breathing  Post-op Assessment: Report given to PACU RN  Post vital signs: stable Filed Vitals:   06/07/11 0620  BP: 156/93  Pulse: 63  Temp: 36.7 C  Resp: 20    Complications: No apparent anesthesia complications

## 2011-06-07 NOTE — Progress Notes (Addendum)
On initial pt contact to get pt ready for surgery pt reports feeling weak, pt alert and oriented x 4 and that his blood sugar was 66 at home. Rechecked CBG and noted to be 52. Dr. Glennon Mac notified orders received.

## 2011-06-11 ENCOUNTER — Encounter (HOSPITAL_COMMUNITY): Payer: Self-pay | Admitting: Vascular Surgery

## 2011-07-02 ENCOUNTER — Other Ambulatory Visit: Payer: Self-pay | Admitting: Oncology

## 2011-07-02 ENCOUNTER — Telehealth: Payer: Self-pay | Admitting: Oncology

## 2011-07-02 ENCOUNTER — Ambulatory Visit (HOSPITAL_BASED_OUTPATIENT_CLINIC_OR_DEPARTMENT_OTHER): Payer: Managed Care, Other (non HMO) | Admitting: Oncology

## 2011-07-02 ENCOUNTER — Other Ambulatory Visit: Payer: Self-pay | Admitting: Nurse Practitioner

## 2011-07-02 ENCOUNTER — Other Ambulatory Visit: Payer: Managed Care, Other (non HMO) | Admitting: Lab

## 2011-07-02 VITALS — BP 148/79 | HR 79 | Temp 97.2°F | Ht 72.0 in | Wt 213.5 lb

## 2011-07-02 DIAGNOSIS — D649 Anemia, unspecified: Secondary | ICD-10-CM

## 2011-07-02 DIAGNOSIS — N186 End stage renal disease: Secondary | ICD-10-CM

## 2011-07-02 DIAGNOSIS — D696 Thrombocytopenia, unspecified: Secondary | ICD-10-CM

## 2011-07-02 LAB — CBC WITH DIFFERENTIAL/PLATELET
Basophils Absolute: 0 10*3/uL (ref 0.0–0.1)
Eosinophils Absolute: 0.1 10*3/uL (ref 0.0–0.5)
HGB: 6.7 g/dL — CL (ref 13.0–17.1)
MONO#: 0.4 10*3/uL (ref 0.1–0.9)
NEUT#: 4.6 10*3/uL (ref 1.5–6.5)
RBC: 2.36 10*6/uL — ABNORMAL LOW (ref 4.20–5.82)
RDW: 12.9 % (ref 11.0–14.6)
WBC: 6.5 10*3/uL (ref 4.0–10.3)
lymph#: 1.5 10*3/uL (ref 0.9–3.3)

## 2011-07-02 LAB — RETICULOCYTES
Immature Retic Fract: 5.3 % (ref 3.00–10.60)
RBC: 2.32 10*6/uL — ABNORMAL LOW (ref 4.20–5.82)
Retic Ct Abs: 24.13 10*3/uL — ABNORMAL LOW (ref 34.80–93.90)

## 2011-07-02 LAB — CHCC SMEAR

## 2011-07-02 MED ORDER — EPOETIN ALFA 20000 UNIT/ML IJ SOLN
40000.0000 [IU] | Freq: Once | INTRAMUSCULAR | Status: AC
  Administered 2011-07-02: 40000 [IU] via SUBCUTANEOUS
  Filled 2011-07-02: qty 2

## 2011-07-02 NOTE — Progress Notes (Signed)
OFFICE PROGRESS NOTE   INTERVAL HISTORY:   He developed nausea and vomiting in January. The BUN and creatinine were markedly elevated. He saw Dr. Lorrene Reid. The blood pressure medications were adjusted . He underwent placement of an AV fistula on 06/07/2011.  He reports malaise and blurred vision for the past one to 2 weeks. He denies dyspnea, fever, focal neurologic symptoms bleeding, and confusion. He last received Procrit at the [facility] on 03/19/2011. The hemoglobin returned at 10.5 on 05/22/2011. Objective:  Vital signs in last 24 hours:  Blood pressure 148/79, pulse 79, temperature 97.2 F (36.2 C), temperature source Oral, height 6' (1.829 m), weight 213 lb 8 oz (96.843 kg).    HEENT: Conjunctivae are pale. Oropharynx without thrush or ulcers. Neck without mass. Lymphatics: No cervical, supraclavicular, axillary, or inguinal nodes Resp: Lungs clear bilaterally Cardio: Regular rate and rhythm GI: No hepatosplenomegaly, no mass Vascular: No leg edema, left arm fistula with a palpable thrill Neuro: Alert and oriented, the motor examination appears grossly intact, the visual fields appear intact  Skin: Resolving rash over the trunk with raised 2-3 mm light brown lesions     Lab Results:  Lab Results  Component Value Date   WBC 6.5 07/02/2011   HGB 6.7* 07/02/2011   HCT 20.4* 07/02/2011   MCV 86.4 07/02/2011   PLT 112* 07/02/2011   ANC 4.6, reticulocyte count 1.04%, absolute reticulocyte count 24.13 Platelet count slight estimate-131 Review of the peripheral blood smear: The platelets appear normal in number. The white cell morphology is unremarkable. The polychromasia is not increased. There are a few ovalocytes, a canthus sites, burr cells, and teardrop forms. No schistocytes. No platelet clumps.    Medications: I have reviewed the patient's current medications.  Assessment/Plan: 1. Normocytic anemia, status post a nondiagnostic bone marrow biopsy,  10/10/2010. a. Status post 3 weeks of Procrit therapy with significant improvement in the anemia (10/29/2010, 11/05/2010 and 11/12/2010). b. He received a Procrit injection on 03/19/2011 when the hemoglobin returned at 9.9. c. The hemoglobin is much lower today and he is symptomatic. 2. 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 3. Renal insufficiency, status post a biopsy, 11/09/2010, confirming glomerulosclerosis and arteriosclerosis. He continues followup with Dr. Lorrene Reid. The renal failure has progressed and he underwent placement of an AV fistula on 06/07/2011 4. Diabetes. 5. Hypertension. 6. Elevated free kappa and lambda light chains. 7. Mild thrombocytopenia- stable.? ITP-a bone marrow biopsy revealed no evidence of a lymphoproliferative disorder.   Disposition:  He has developed severe symptomatic anemia. The anemia is most likely related to renal failure. He last received Procrit in October of 2012. The resumed Procrit therapy today. He will be scheduled for a red cell transfusion on 07/03/2011. He will be maintained on a weekly Procrit until the anemia improves. He is scheduled for an office visit in 2 weeks. We will check iron studies when he returns next week.  There is no clinical evidence for hemolysis, bleeding, or a primary bone marrow process to explain the anemia.   Electa Sniff, MD  07/02/2011  6:39 PM

## 2011-07-02 NOTE — Telephone Encounter (Signed)
appts made and printed for 2/19 and 2/26 aom

## 2011-07-02 NOTE — Patient Instructions (Signed)
Blood transfusion at [place] on 07/03/11--arrive at [facility] on emergency room side and enter through [facility] in at reception desk for [address] [facility]

## 2011-07-03 ENCOUNTER — Encounter (HOSPITAL_COMMUNITY)
Admission: RE | Admit: 2011-07-03 | Discharge: 2011-07-03 | Disposition: A | Discharge status: Home / Self Care | Payer: Managed Care, Other (non HMO) | Source: Ambulatory Visit | Attending: Oncology | Admitting: Oncology

## 2011-07-03 ENCOUNTER — Other Ambulatory Visit: Payer: Self-pay | Admitting: Nurse Practitioner

## 2011-07-03 DIAGNOSIS — D649 Anemia, unspecified: Secondary | ICD-10-CM

## 2011-07-03 LAB — PREPARE RBC (CROSSMATCH)

## 2011-07-03 NOTE — Progress Notes (Signed)
Heart rate elevated. Heart rate rechecked apically and confirmed. Heart rate slightly irregular. Patient reports that he feels his heart racing. Lungs clear to auscultation.

## 2011-07-03 NOTE — Progress Notes (Signed)
Heart rate and irregular rhythm reported to [facility] at Dr. Gearldine Shown office.

## 2011-07-04 LAB — TYPE AND SCREEN

## 2011-07-09 ENCOUNTER — Ambulatory Visit (HOSPITAL_BASED_OUTPATIENT_CLINIC_OR_DEPARTMENT_OTHER): Payer: Managed Care, Other (non HMO)

## 2011-07-09 ENCOUNTER — Other Ambulatory Visit: Payer: Managed Care, Other (non HMO) | Admitting: Lab

## 2011-07-09 VITALS — BP 147/78 | HR 78 | Temp 97.7°F

## 2011-07-09 DIAGNOSIS — D649 Anemia, unspecified: Secondary | ICD-10-CM

## 2011-07-09 DIAGNOSIS — N186 End stage renal disease: Secondary | ICD-10-CM

## 2011-07-09 LAB — CBC WITH DIFFERENTIAL/PLATELET
Eosinophils Absolute: 0.1 10*3/uL (ref 0.0–0.5)
HCT: 27.7 % — ABNORMAL LOW (ref 38.4–49.9)
LYMPH%: 13.9 % — ABNORMAL LOW (ref 14.0–49.0)
MONO#: 0.6 10*3/uL (ref 0.1–0.9)
NEUT#: 5.3 10*3/uL (ref 1.5–6.5)
Platelets: 143 10*3/uL (ref 140–400)
RBC: 3.05 10*6/uL — ABNORMAL LOW (ref 4.20–5.82)
WBC: 7 10*3/uL (ref 4.0–10.3)

## 2011-07-09 LAB — IRON AND TIBC
%SAT: 23 % (ref 20–55)
Iron: 57 ug/dL (ref 42–165)
TIBC: 244 ug/dL (ref 215–435)

## 2011-07-09 LAB — FERRITIN: Ferritin: 323 ng/mL — ABNORMAL HIGH (ref 22–322)

## 2011-07-09 MED ORDER — EPOETIN ALFA 40000 UNIT/ML IJ SOLN
40000.0000 [IU] | Freq: Once | INTRAMUSCULAR | Status: AC
  Administered 2011-07-09: 40000 [IU] via SUBCUTANEOUS
  Filled 2011-07-09: qty 1

## 2011-07-16 ENCOUNTER — Other Ambulatory Visit: Payer: Managed Care, Other (non HMO) | Admitting: Lab

## 2011-07-16 ENCOUNTER — Encounter: Payer: Self-pay | Admitting: Vascular Surgery

## 2011-07-16 ENCOUNTER — Ambulatory Visit (HOSPITAL_BASED_OUTPATIENT_CLINIC_OR_DEPARTMENT_OTHER): Payer: Managed Care, Other (non HMO) | Admitting: Nurse Practitioner

## 2011-07-16 ENCOUNTER — Telehealth: Payer: Self-pay | Admitting: Oncology

## 2011-07-16 VITALS — BP 137/78 | HR 69 | Temp 98.1°F | Ht 72.0 in | Wt 209.9 lb

## 2011-07-16 DIAGNOSIS — D649 Anemia, unspecified: Secondary | ICD-10-CM

## 2011-07-16 DIAGNOSIS — N186 End stage renal disease: Secondary | ICD-10-CM

## 2011-07-16 LAB — CBC WITH DIFFERENTIAL/PLATELET
BASO%: 0.5 % (ref 0.0–2.0)
EOS%: 1.5 % (ref 0.0–7.0)
HGB: 9.9 g/dL — ABNORMAL LOW (ref 13.0–17.1)
MCH: 31.1 pg (ref 27.2–33.4)
MCHC: 33.3 g/dL (ref 32.0–36.0)
RBC: 3.2 10*6/uL — ABNORMAL LOW (ref 4.20–5.82)
RDW: 15.6 % — ABNORMAL HIGH (ref 11.0–14.6)
lymph#: 1.5 10*3/uL (ref 0.9–3.3)

## 2011-07-16 MED ORDER — EPOETIN ALFA 40000 UNIT/ML IJ SOLN
40000.0000 [IU] | Freq: Once | INTRAMUSCULAR | Status: AC
  Administered 2011-07-16: 40000 [IU] via SUBCUTANEOUS

## 2011-07-16 NOTE — Progress Notes (Signed)
OFFICE PROGRESS NOTE  Interval history:  Paul [name] returns as scheduled. He is feeling better. He denies shortness of breath. He had an episode of nausea/vomiting of the past weekend. He also had recent "diarrhea".   Objective: Blood pressure 137/78, pulse 69, temperature 98.1 F (36.7 C), temperature source Oral, height 6' (1.829 m), weight 209 lb 14.4 oz (95.21 kg).  Oropharynx is without thrush or ulceration. No palpable cervical or supraclavicular lymph nodes. Lungs are clear. Regular cardiac rhythm. Abdomen is soft and nontender. No organomegaly. 1+ pretibial edema bilaterally.  Lab Results: Lab Results  Component Value Date   WBC 6.2 07/16/2011   HGB 9.9* 07/16/2011   HCT 29.9* 07/16/2011   MCV 93.2 07/16/2011   PLT 137* 07/16/2011    Chemistry:    Chemistry      Component Value Date/Time   NA 147* 06/07/2011 0737   K 4.8 06/07/2011 0737   CL 106 03/19/2011 1502   CO2 23 03/19/2011 1502   BUN 51* 03/19/2011 1502   CREATININE 3.22* 03/19/2011 1502      Component Value Date/Time   CALCIUM 9.2 03/19/2011 1502   ALKPHOS 59 12/27/2005 0847   AST 17 12/27/2005 0847   ALT 11 12/27/2005 0847   BILITOT 0.7 12/27/2005 0847       Studies/Results: No results found.  Medications: I have reviewed the patient's current medications.  Assessment/Plan:  1. Normocytic anemia, status post a nondiagnostic bone marrow biopsy, 10/10/2010. a. Status post 3 weeks of Procrit therapy with significant improvement in the anemia (10/29/2010, 11/05/2010 and 11/12/2010). b. He received a Procrit injection on 03/19/2011 when the hemoglobin returned at 9.9. c. The hemoglobin returned lower 07/02/2011. Procrit therapy was resumed. He was transfused 2 units of blood on 07/03/2011. 2. 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 3. Renal insufficiency, status post a biopsy, 11/09/2010, confirming glomerulosclerosis and arteriosclerosis. He continues followup with Paul [name]. The renal failure has  progressed and he underwent placement of an AV fistula on 06/07/2011 4. Diabetes. 5. Hypertension. 6. Elevated free kappa and lambda light chains. 7. Mild thrombocytopenia- stable.? ITP; a bone marrow biopsy revealed no evidence of a lymphoproliferative disorder.  Disposition-Paul [name]'s hemoglobin is better. He will receive a Procrit injection today. The schedule will be then be adjusted to every 2 weeks. He will return for a followup visit with Paul [name] in the next 6-8 weeks. He will contact the office the interim with any problems.  Plan reviewed with Paul [name].  Ned Card ANP/GNP-BC

## 2011-07-16 NOTE — Progress Notes (Signed)
Addended by: Tania Ade on: 07/16/2011 05:35 PM   Modules accepted: Orders

## 2011-07-16 NOTE — Telephone Encounter (Signed)
Gv pt appt for [id number]

## 2011-07-17 ENCOUNTER — Encounter: Payer: Self-pay | Admitting: Vascular Surgery

## 2011-07-17 ENCOUNTER — Ambulatory Visit (INDEPENDENT_AMBULATORY_CARE_PROVIDER_SITE_OTHER): Payer: Managed Care, Other (non HMO) | Admitting: Vascular Surgery

## 2011-07-17 VITALS — BP 146/78 | HR 71 | Temp 98.4°F | Ht 72.0 in | Wt 209.0 lb

## 2011-07-17 DIAGNOSIS — N186 End stage renal disease: Secondary | ICD-10-CM

## 2011-07-17 NOTE — Progress Notes (Signed)
[facility] [place]  Patient name: Paul [name] MRN: [id number] DOB: Oct 22, 1955 Sex: male  REASON FOR VISIT: follow up after left brachiocephalic AV fistula  HPI: Paul [name] is a 56 y.o. male who had a left brachiocephalic AV fistula on [id number]. He has done well except he has had some tingling in the left hand which was gradually increasing but is now leveled off. He's had no real pain in the hand. He's had no pain in the upper arm.  REVIEW OF SYSTEMS: Valu.Nieves ] denotes positive finding; [  ] denotes negative finding  CARDIOVASCULAR:  [ ]  chest pain   [ ]  chest pressure   [ ]  palpitations   [ ]  orthopnea  CONSTITUTIONAL:  [ ]  fever   [ ]  chills  PHYSICAL EXAM: Filed Vitals:   07/17/11 0855  BP: 146/78  Pulse: 71  Temp: 98.4 F (36.9 C)  TempSrc: Oral  Height: 6' (1.829 m)  Weight: 209 lb (94.802 kg)  SpO2: 97%   Body mass index is 28.35 kg/(m^2). GENERAL: The patient is a well-nourished male, in no acute distress. The vital signs are documented above. CARDIOVASCULAR: There is a regular rate and rhythm.  The left upper arm AV fistula has an excellent thrill. He has a palpable left radial pulse. He has a good radial ulnar and palmar arch signal with the Doppler. These signals do augment with compression of his fistula.  MEDICAL ISSUES: The fistula appears to be maturing nicely. It is a good sized vein. He does have evidence of a mild steal. I've encouraged him to exercise his hand. I'll see him back in one month. He knows to call sooner if his symptoms do not improve. Based on his Doppler study in a palpable left radial pulse, I think the hand is adequately perfused.  [place] [facility] [place] Beeper: [phone]

## 2011-07-30 ENCOUNTER — Other Ambulatory Visit (HOSPITAL_BASED_OUTPATIENT_CLINIC_OR_DEPARTMENT_OTHER): Payer: Managed Care, Other (non HMO) | Admitting: Lab

## 2011-07-30 ENCOUNTER — Ambulatory Visit (HOSPITAL_BASED_OUTPATIENT_CLINIC_OR_DEPARTMENT_OTHER): Payer: Managed Care, Other (non HMO)

## 2011-07-30 VITALS — BP 151/81 | HR 70 | Temp 97.8°F

## 2011-07-30 DIAGNOSIS — N186 End stage renal disease: Secondary | ICD-10-CM

## 2011-07-30 DIAGNOSIS — D649 Anemia, unspecified: Secondary | ICD-10-CM

## 2011-07-30 LAB — CBC WITH DIFFERENTIAL/PLATELET
Basophils Absolute: 0 10*3/uL (ref 0.0–0.1)
Eosinophils Absolute: 0.1 10*3/uL (ref 0.0–0.5)
HGB: 10.8 g/dL — ABNORMAL LOW (ref 13.0–17.1)
NEUT#: 3.4 10*3/uL (ref 1.5–6.5)
RDW: 15.8 % — ABNORMAL HIGH (ref 11.0–14.6)
WBC: 5.4 10*3/uL (ref 4.0–10.3)
lymph#: 1.5 10*3/uL (ref 0.9–3.3)

## 2011-07-30 MED ORDER — EPOETIN ALFA 40000 UNIT/ML IJ SOLN
40000.0000 [IU] | Freq: Once | INTRAMUSCULAR | Status: AC
  Administered 2011-07-30: 40000 [IU] via SUBCUTANEOUS
  Filled 2011-07-30: qty 1

## 2011-08-13 ENCOUNTER — Ambulatory Visit: Payer: Managed Care, Other (non HMO)

## 2011-08-13 ENCOUNTER — Encounter: Payer: Self-pay | Admitting: Vascular Surgery

## 2011-08-13 ENCOUNTER — Other Ambulatory Visit (HOSPITAL_BASED_OUTPATIENT_CLINIC_OR_DEPARTMENT_OTHER): Payer: Managed Care, Other (non HMO) | Admitting: Lab

## 2011-08-13 DIAGNOSIS — D649 Anemia, unspecified: Secondary | ICD-10-CM

## 2011-08-13 LAB — CBC WITH DIFFERENTIAL/PLATELET
Basophils Absolute: 0 10*3/uL (ref 0.0–0.1)
Eosinophils Absolute: 0.1 10*3/uL (ref 0.0–0.5)
HGB: 12.9 g/dL — ABNORMAL LOW (ref 13.0–17.1)
LYMPH%: 12.8 % — ABNORMAL LOW (ref 14.0–49.0)
MCV: 95.1 fL (ref 79.3–98.0)
MONO#: 0.4 10*3/uL (ref 0.1–0.9)
MONO%: 5.6 % (ref 0.0–14.0)
NEUT#: 6.3 10*3/uL (ref 1.5–6.5)
Platelets: 132 10*3/uL — ABNORMAL LOW (ref 140–400)
RBC: 4.14 10*6/uL — ABNORMAL LOW (ref 4.20–5.82)
WBC: 7.8 10*3/uL (ref 4.0–10.3)

## 2011-08-13 NOTE — Patient Instructions (Signed)
Pt did not need procrit today.  Pt discharged home ambulatory and instructed to call for questions and concerns

## 2011-08-14 ENCOUNTER — Encounter: Payer: Self-pay | Admitting: Vascular Surgery

## 2011-08-14 ENCOUNTER — Ambulatory Visit (INDEPENDENT_AMBULATORY_CARE_PROVIDER_SITE_OTHER): Payer: Managed Care, Other (non HMO) | Admitting: Vascular Surgery

## 2011-08-14 VITALS — BP 148/68 | HR 71 | Resp 14 | Ht 73.0 in | Wt 205.0 lb

## 2011-08-14 DIAGNOSIS — N186 End stage renal disease: Secondary | ICD-10-CM

## 2011-08-14 NOTE — Progress Notes (Signed)
[facility] [place]  Patient name: Paul [name] MRN: [id number] DOB: July 15, 1955 Sex: male  REASON FOR VISIT: follow up of left brachiocephalic AV fistula is mild steal  HPI: Paul [name] is a 56 y.o. male who had a left brachiocephalic fistula on [id number]. I saw him in follow up on 07/17/2011 with some mild steal symptoms. He comes in for a one-month follow up visit. He states his symptoms have been gradually improving. He still occasionally has some tingling in his thumb and index finger on the left. He is not yet on dialysis. REVIEW OF SYSTEMS: Valu.Nieves ] denotes positive finding; [  ] denotes negative finding  CARDIOVASCULAR:  [ ]  chest pain   [ ]  chest pressure   [ ]  palpitations   [ ]  orthopnea   CONSTITUTIONAL:  [ ]  fever   [ ]  chills  PHYSICAL EXAM: Filed Vitals:   08/14/11 0848  BP: 148/68  Pulse: 71  Resp: 14  Height: 6\' 1"  (1.854 m)  Weight: 205 lb (92.987 kg)  SpO2: 98%   Body mass index is 27.05 kg/(m^2). GENERAL: The patient is a well-nourished male, in no acute distress. The vital signs are documented above. CARDIOVASCULAR: There is a regular rate and rhythm without significant murmur appreciated.  He has an excellent thrill in his upper arm fistula which has matured nicely. His incision is healed nicely. He has a palpable left radial pulse. The hand appears adequately perfused. He has good strength in the left hand.   MEDICAL ISSUES: Improving mild steal symptoms of left hand. Fistula maturing nicely. I will see him back as needed. The fistula can be used if needed.  [place] [facility] [place] Beeper: [phone]

## 2011-08-29 ENCOUNTER — Telehealth: Payer: Self-pay | Admitting: Oncology

## 2011-08-29 ENCOUNTER — Other Ambulatory Visit: Payer: Managed Care, Other (non HMO) | Admitting: Lab

## 2011-08-29 ENCOUNTER — Ambulatory Visit (HOSPITAL_BASED_OUTPATIENT_CLINIC_OR_DEPARTMENT_OTHER): Payer: Managed Care, Other (non HMO) | Admitting: Oncology

## 2011-08-29 VITALS — BP 140/65 | HR 68 | Temp 98.6°F | Ht 73.0 in | Wt 203.7 lb

## 2011-08-29 DIAGNOSIS — Z8509 Personal history of malignant neoplasm of other digestive organs: Secondary | ICD-10-CM

## 2011-08-29 DIAGNOSIS — N19 Unspecified kidney failure: Secondary | ICD-10-CM

## 2011-08-29 DIAGNOSIS — D649 Anemia, unspecified: Secondary | ICD-10-CM

## 2011-08-29 DIAGNOSIS — D696 Thrombocytopenia, unspecified: Secondary | ICD-10-CM

## 2011-08-29 LAB — CBC WITH DIFFERENTIAL/PLATELET
Basophils Absolute: 0.1 10*3/uL (ref 0.0–0.1)
Eosinophils Absolute: 0.1 10*3/uL (ref 0.0–0.5)
HGB: 11.5 g/dL — ABNORMAL LOW (ref 13.0–17.1)
LYMPH%: 26.9 % (ref 14.0–49.0)
MCV: 92.5 fL (ref 79.3–98.0)
MONO#: 0.5 10*3/uL (ref 0.1–0.9)
MONO%: 7.6 % (ref 0.0–14.0)
NEUT#: 4.3 10*3/uL (ref 1.5–6.5)
Platelets: 81 10*3/uL — ABNORMAL LOW (ref 140–400)
RDW: 13.7 % (ref 11.0–14.6)
WBC: 6.8 10*3/uL (ref 4.0–10.3)

## 2011-08-29 NOTE — Telephone Encounter (Signed)
APPTS MADE AND PRINTED FOR PT AOM °

## 2011-08-29 NOTE — Progress Notes (Signed)
OFFICE PROGRESS NOTE   INTERVAL HISTORY:   He returns as scheduled. He last received Procrit on 07/30/2011 when the hemoglobin returned at 10.8. He did not receive Procrit on 08/13/2011 when the hemoglobin was measured at 12.9.  He has no specific complaints. He continues close followup with Dr. Lorrene Reid for management of renal failure.  Objective:  Vital signs in last 24 hours:  Blood pressure 140/65, pulse 68, temperature 98.6 F (37 C), temperature source Oral, height 6\' 1"  (1.854 m), weight 203 lb 11.2 oz (92.398 kg).  Lymphatics: No cervical, supraclavicular, axillary, or inguinal nodes Resp: Lungs clear bilaterally Cardio: Regular rate and rhythm GI: No hepatosplenomegaly, no mass Vascular: No leg edema   Lab Results:  Lab Results  Component Value Date   WBC 6.8 08/29/2011   HGB 11.5* 08/29/2011   HCT 34.6* 08/29/2011   MCV 92.5 08/29/2011   PLT 81* 08/29/2011   ANC 4.3    Medications: I have reviewed the patient's current medications.  Assessment/Plan: 1. Normocytic anemia, status post a nondiagnostic bone marrow biopsy, 10/10/2010. a. Status post 3 weeks of Procrit therapy with significant improvement in the anemia (10/29/2010, 11/05/2010 and 11/12/2010). b. He received a Procrit injection on 03/19/2011 when the hemoglobin returned at 9.9. c. The hemoglobin returned lower 07/02/2011. Procrit therapy was resumed. He was transfused 2 units of blood on 07/03/2011. 2. 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 3. Renal insufficiency, status post a biopsy, 11/09/2010, confirming glomerulosclerosis and arteriosclerosis. He continues followup with Dr. Lorrene Reid. The renal failure has progressed and he underwent placement of an AV fistula on 06/07/2011 4. Diabetes. 5. Hypertension. 6. Elevated free kappa and lambda light chains. 7. Mild thrombocytopenia- persistent, status post a nondiagnostic bone marrow biopsy may 23rd 2012   Disposition:  The hemoglobin remains  above the goal range. We decided to hold Procrit again today. He will return for a CBC and Procrit as indicated in 2 weeks. We will then try spacing the Procrit to every 4 weeks. He will return for an office visit in 2 months.  The thrombocytopenia could be related to underlying ITP. No clinical evidence for the development of a lymphoproliferative disorder.   Electa Sniff, MD  08/29/2011  1:58 PM

## 2011-09-12 ENCOUNTER — Ambulatory Visit: Payer: Managed Care, Other (non HMO)

## 2011-09-12 ENCOUNTER — Other Ambulatory Visit (HOSPITAL_BASED_OUTPATIENT_CLINIC_OR_DEPARTMENT_OTHER): Payer: Managed Care, Other (non HMO) | Admitting: Lab

## 2011-09-12 DIAGNOSIS — D649 Anemia, unspecified: Secondary | ICD-10-CM

## 2011-09-12 LAB — CBC WITH DIFFERENTIAL/PLATELET
BASO%: 0.8 % (ref 0.0–2.0)
Basophils Absolute: 0.1 10*3/uL (ref 0.0–0.1)
EOS%: 2 % (ref 0.0–7.0)
HCT: 33.4 % — ABNORMAL LOW (ref 38.4–49.9)
HGB: 11.2 g/dL — ABNORMAL LOW (ref 13.0–17.1)
LYMPH%: 30 % (ref 14.0–49.0)
MCH: 30.4 pg (ref 27.2–33.4)
MCHC: 33.5 g/dL (ref 32.0–36.0)
MCV: 90.7 fL (ref 79.3–98.0)
MONO%: 8 % (ref 0.0–14.0)
NEUT%: 59.2 % (ref 39.0–75.0)
Platelets: 115 10*3/uL — ABNORMAL LOW (ref 140–400)

## 2011-09-12 NOTE — Progress Notes (Signed)
hgn 11.2 no procrit indicated. Pt given copy of labs.  dmr

## 2011-10-10 ENCOUNTER — Other Ambulatory Visit (HOSPITAL_BASED_OUTPATIENT_CLINIC_OR_DEPARTMENT_OTHER): Payer: Managed Care, Other (non HMO) | Admitting: Lab

## 2011-10-10 ENCOUNTER — Ambulatory Visit (HOSPITAL_BASED_OUTPATIENT_CLINIC_OR_DEPARTMENT_OTHER): Payer: Managed Care, Other (non HMO)

## 2011-10-10 ENCOUNTER — Other Ambulatory Visit: Payer: Self-pay | Admitting: Oncology

## 2011-10-10 ENCOUNTER — Telehealth: Payer: Self-pay | Admitting: Oncology

## 2011-10-10 ENCOUNTER — Other Ambulatory Visit: Payer: Self-pay | Admitting: *Deleted

## 2011-10-10 VITALS — BP 153/77 | HR 69 | Temp 98.1°F

## 2011-10-10 DIAGNOSIS — D649 Anemia, unspecified: Secondary | ICD-10-CM

## 2011-10-10 DIAGNOSIS — N186 End stage renal disease: Secondary | ICD-10-CM

## 2011-10-10 DIAGNOSIS — N289 Disorder of kidney and ureter, unspecified: Secondary | ICD-10-CM

## 2011-10-10 LAB — CBC WITH DIFFERENTIAL/PLATELET
EOS%: 2.6 % (ref 0.0–7.0)
Eosinophils Absolute: 0.2 10*3/uL (ref 0.0–0.5)
LYMPH%: 29.2 % (ref 14.0–49.0)
MCH: 29.8 pg (ref 27.2–33.4)
MCV: 88.8 fL (ref 79.3–98.0)
MONO%: 9.6 % (ref 0.0–14.0)
NEUT#: 3.8 10*3/uL (ref 1.5–6.5)
Platelets: 115 10*3/uL — ABNORMAL LOW (ref 140–400)
RBC: 3.14 10*6/uL — ABNORMAL LOW (ref 4.20–5.82)
RDW: 13.2 % (ref 11.0–14.6)
nRBC: 0 % (ref 0–0)

## 2011-10-10 MED ORDER — EPOETIN ALFA 40000 UNIT/ML IJ SOLN
40000.0000 [IU] | Freq: Once | INTRAMUSCULAR | Status: AC
  Administered 2011-10-10: 40000 [IU] via SUBCUTANEOUS
  Filled 2011-10-10: qty 1

## 2011-10-10 NOTE — Telephone Encounter (Signed)
Gv pt appt for june2013 

## 2011-10-24 ENCOUNTER — Ambulatory Visit: Payer: Managed Care, Other (non HMO)

## 2011-10-24 ENCOUNTER — Other Ambulatory Visit: Payer: Managed Care, Other (non HMO) | Admitting: Lab

## 2011-10-25 ENCOUNTER — Other Ambulatory Visit (HOSPITAL_BASED_OUTPATIENT_CLINIC_OR_DEPARTMENT_OTHER): Payer: Managed Care, Other (non HMO)

## 2011-10-25 ENCOUNTER — Ambulatory Visit (HOSPITAL_BASED_OUTPATIENT_CLINIC_OR_DEPARTMENT_OTHER): Payer: Managed Care, Other (non HMO)

## 2011-10-25 VITALS — BP 127/68 | HR 65 | Temp 97.7°F

## 2011-10-25 DIAGNOSIS — D649 Anemia, unspecified: Secondary | ICD-10-CM

## 2011-10-25 DIAGNOSIS — N186 End stage renal disease: Secondary | ICD-10-CM

## 2011-10-25 LAB — CBC WITH DIFFERENTIAL/PLATELET
BASO%: 0.5 % (ref 0.0–2.0)
EOS%: 2 % (ref 0.0–7.0)
LYMPH%: 16.6 % (ref 14.0–49.0)
MCH: 29.8 pg (ref 27.2–33.4)
MCHC: 33.2 g/dL (ref 32.0–36.0)
MCV: 89.7 fL (ref 79.3–98.0)
MONO%: 5 % (ref 0.0–14.0)
NEUT#: 5.6 10*3/uL (ref 1.5–6.5)
Platelets: 117 10*3/uL — ABNORMAL LOW (ref 140–400)
RBC: 3.12 10*6/uL — ABNORMAL LOW (ref 4.20–5.82)
RDW: 15.1 % — ABNORMAL HIGH (ref 11.0–14.6)

## 2011-10-25 MED ORDER — EPOETIN ALFA 40000 UNIT/ML IJ SOLN
40000.0000 [IU] | Freq: Once | INTRAMUSCULAR | Status: AC
  Administered 2011-10-25: 40000 [IU] via SUBCUTANEOUS
  Filled 2011-10-25: qty 1

## 2011-11-07 ENCOUNTER — Ambulatory Visit: Payer: Managed Care, Other (non HMO)

## 2011-11-07 ENCOUNTER — Other Ambulatory Visit (HOSPITAL_BASED_OUTPATIENT_CLINIC_OR_DEPARTMENT_OTHER): Payer: Managed Care, Other (non HMO) | Admitting: Lab

## 2011-11-07 ENCOUNTER — Telehealth: Payer: Self-pay | Admitting: Oncology

## 2011-11-07 ENCOUNTER — Ambulatory Visit (HOSPITAL_BASED_OUTPATIENT_CLINIC_OR_DEPARTMENT_OTHER): Payer: Managed Care, Other (non HMO) | Admitting: Nurse Practitioner

## 2011-11-07 VITALS — BP 146/78 | HR 70 | Temp 97.3°F | Ht 73.0 in | Wt 209.5 lb

## 2011-11-07 DIAGNOSIS — N289 Disorder of kidney and ureter, unspecified: Secondary | ICD-10-CM

## 2011-11-07 DIAGNOSIS — D649 Anemia, unspecified: Secondary | ICD-10-CM

## 2011-11-07 DIAGNOSIS — D696 Thrombocytopenia, unspecified: Secondary | ICD-10-CM

## 2011-11-07 DIAGNOSIS — I10 Essential (primary) hypertension: Secondary | ICD-10-CM

## 2011-11-07 DIAGNOSIS — N186 End stage renal disease: Secondary | ICD-10-CM

## 2011-11-07 LAB — CBC WITH DIFFERENTIAL/PLATELET
BASO%: 0.3 % (ref 0.0–2.0)
Eosinophils Absolute: 0.2 10*3/uL (ref 0.0–0.5)
LYMPH%: 26.6 % (ref 14.0–49.0)
MCHC: 33.2 g/dL (ref 32.0–36.0)
MONO#: 0.4 10*3/uL (ref 0.1–0.9)
MONO%: 6.7 % (ref 0.0–14.0)
NEUT#: 3.9 10*3/uL (ref 1.5–6.5)
Platelets: 130 10*3/uL — ABNORMAL LOW (ref 140–400)
RBC: 3.2 10*6/uL — ABNORMAL LOW (ref 4.20–5.82)
RDW: 15.4 % — ABNORMAL HIGH (ref 11.0–14.6)
WBC: 6.1 10*3/uL (ref 4.0–10.3)

## 2011-11-07 MED ORDER — EPOETIN ALFA 40000 UNIT/ML IJ SOLN
40000.0000 [IU] | Freq: Once | INTRAMUSCULAR | Status: AC
  Administered 2011-11-07: 40000 [IU] via SUBCUTANEOUS
  Filled 2011-11-07: qty 1

## 2011-11-07 NOTE — Progress Notes (Signed)
OFFICE PROGRESS NOTE  Interval history:  [name] returns as scheduled. He is receiving Procrit on a 2 week schedule. He overall feels well. He has a good energy level. He denies shortness of breath.   Objective: Blood pressure 146/78, pulse 70, temperature 97.3 F (36.3 C), temperature source Oral, height 6\' 1"  (1.854 m), weight 209 lb 8 oz (95.029 kg).  Lungs are clear. No wheezes or rales. Regular cardiac rhythm. Abdomen is soft and nontender. No organomegaly. Trace bilateral pretibial/ankle edema.  Lab Results: Lab Results  Component Value Date   WBC 6.1 11/07/2011   HGB 9.8* 11/07/2011   HCT 29.5* 11/07/2011   MCV 92.2 11/07/2011   PLT 130* 11/07/2011    Chemistry:    Chemistry      Component Value Date/Time   NA 147* 06/07/2011 0737   K 4.8 06/07/2011 0737   CL 106 03/19/2011 1502   CO2 23 03/19/2011 1502   BUN 51* 03/19/2011 1502   CREATININE 3.22* 03/19/2011 1502      Component Value Date/Time   CALCIUM 9.2 03/19/2011 1502   ALKPHOS 59 12/27/2005 0847   AST 17 12/27/2005 0847   ALT 11 12/27/2005 0847   BILITOT 0.7 12/27/2005 0847       Studies/Results: No results found.  Medications: I have reviewed the patient's current medications.  Assessment/Plan:  1. Normocytic anemia, status post a nondiagnostic bone marrow biopsy, 10/10/2010. a. Status post 3 weeks of Procrit therapy with significant improvement in the anemia (10/29/2010, 11/05/2010 and 11/12/2010). b. He received a Procrit injection on 03/19/2011 when the hemoglobin returned at 9.9. c. The hemoglobin returned lower 07/02/2011. Procrit therapy was resumed. He was transfused 2 units of blood on 07/03/2011. 2. 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 3. Renal insufficiency, status post a biopsy, 11/09/2010, confirming glomerulosclerosis and arteriosclerosis. He continues followup with Dr. Lorrene Reid. The renal failure has progressed and he underwent placement of an AV fistula on  06/07/2011. 4. Diabetes. 5. Hypertension. 6. Elevated free kappa and lambda light chains. 7. Mild thrombocytopenia status post a nondiagnostic bone marrow biopsy 10/10/2010.  Disposition-plan to continue Procrit on a 2 week schedule. He will return for a followup visit in 8 weeks. He will contact the office in the interim with any problems.  Plan reviewed with Dr. Benay Spice.  Ned Card ANP/GNP-BC

## 2011-11-07 NOTE — Telephone Encounter (Signed)
Gave pt app calendar for July and Aug 2013 lab and Inj with md

## 2011-11-13 DIAGNOSIS — E119 Type 2 diabetes mellitus without complications: Secondary | ICD-10-CM | POA: Insufficient documentation

## 2011-11-18 ENCOUNTER — Other Ambulatory Visit: Payer: Self-pay | Admitting: Family Medicine

## 2011-11-18 DIAGNOSIS — N19 Unspecified kidney failure: Secondary | ICD-10-CM

## 2011-11-25 ENCOUNTER — Ambulatory Visit (HOSPITAL_BASED_OUTPATIENT_CLINIC_OR_DEPARTMENT_OTHER): Payer: Managed Care, Other (non HMO)

## 2011-11-25 ENCOUNTER — Other Ambulatory Visit (HOSPITAL_BASED_OUTPATIENT_CLINIC_OR_DEPARTMENT_OTHER): Payer: Managed Care, Other (non HMO) | Admitting: Lab

## 2011-11-25 VITALS — BP 140/79 | HR 63 | Temp 97.6°F

## 2011-11-25 DIAGNOSIS — D649 Anemia, unspecified: Secondary | ICD-10-CM

## 2011-11-25 DIAGNOSIS — N289 Disorder of kidney and ureter, unspecified: Secondary | ICD-10-CM

## 2011-11-25 DIAGNOSIS — N186 End stage renal disease: Secondary | ICD-10-CM

## 2011-11-25 LAB — CBC WITH DIFFERENTIAL/PLATELET
BASO%: 0.6 % (ref 0.0–2.0)
Basophils Absolute: 0 10*3/uL (ref 0.0–0.1)
Eosinophils Absolute: 0.1 10*3/uL (ref 0.0–0.5)
HCT: 31.2 % — ABNORMAL LOW (ref 38.4–49.9)
HGB: 10.5 g/dL — ABNORMAL LOW (ref 13.0–17.1)
LYMPH%: 21.3 % (ref 14.0–49.0)
MCHC: 33.6 g/dL (ref 32.0–36.0)
MONO#: 0.5 10*3/uL (ref 0.1–0.9)
NEUT%: 67.5 % (ref 39.0–75.0)
Platelets: 96 10*3/uL — ABNORMAL LOW (ref 140–400)
WBC: 5.8 10*3/uL (ref 4.0–10.3)
lymph#: 1.2 10*3/uL (ref 0.9–3.3)

## 2011-11-25 MED ORDER — EPOETIN ALFA 40000 UNIT/ML IJ SOLN
40000.0000 [IU] | Freq: Once | INTRAMUSCULAR | Status: AC
  Administered 2011-11-25: 40000 [IU] via SUBCUTANEOUS
  Filled 2011-11-25: qty 1

## 2011-11-26 ENCOUNTER — Ambulatory Visit
Admission: RE | Admit: 2011-11-26 | Discharge: 2011-11-26 | Disposition: A | Discharge status: Home / Self Care | Payer: Managed Care, Other (non HMO) | Source: Ambulatory Visit | Attending: Family Medicine | Admitting: Family Medicine

## 2011-11-26 DIAGNOSIS — N19 Unspecified kidney failure: Secondary | ICD-10-CM

## 2011-12-09 ENCOUNTER — Ambulatory Visit: Payer: Managed Care, Other (non HMO)

## 2011-12-09 ENCOUNTER — Other Ambulatory Visit (HOSPITAL_BASED_OUTPATIENT_CLINIC_OR_DEPARTMENT_OTHER): Payer: Managed Care, Other (non HMO) | Admitting: Lab

## 2011-12-09 DIAGNOSIS — D649 Anemia, unspecified: Secondary | ICD-10-CM

## 2011-12-09 DIAGNOSIS — N186 End stage renal disease: Secondary | ICD-10-CM

## 2011-12-09 LAB — CBC WITH DIFFERENTIAL/PLATELET
BASO%: 0.3 % (ref 0.0–2.0)
Basophils Absolute: 0 10*3/uL (ref 0.0–0.1)
EOS%: 1.9 % (ref 0.0–7.0)
HCT: 35.8 % — ABNORMAL LOW (ref 38.4–49.9)
HGB: 12.2 g/dL — ABNORMAL LOW (ref 13.0–17.1)
MCH: 31.9 pg (ref 27.2–33.4)
MCHC: 34.1 g/dL (ref 32.0–36.0)
MCV: 93.7 fL (ref 79.3–98.0)
MONO%: 7.4 % (ref 0.0–14.0)
NEUT%: 69.2 % (ref 39.0–75.0)
RDW: 13.7 % (ref 11.0–14.6)
lymph#: 1.5 10*3/uL (ref 0.9–3.3)

## 2011-12-09 MED ORDER — EPOETIN ALFA 20000 UNIT/ML IJ SOLN: 40000.0000 [IU] | Freq: Once | INTRAMUSCULAR | Status: DC

## 2011-12-13 ENCOUNTER — Other Ambulatory Visit: Payer: Self-pay | Admitting: Gastroenterology

## 2011-12-23 ENCOUNTER — Other Ambulatory Visit (HOSPITAL_BASED_OUTPATIENT_CLINIC_OR_DEPARTMENT_OTHER): Payer: Managed Care, Other (non HMO)

## 2011-12-23 ENCOUNTER — Ambulatory Visit: Payer: Managed Care, Other (non HMO)

## 2011-12-23 DIAGNOSIS — D649 Anemia, unspecified: Secondary | ICD-10-CM

## 2011-12-23 LAB — CBC WITH DIFFERENTIAL/PLATELET
BASO%: 0.4 % (ref 0.0–2.0)
Basophils Absolute: 0 10*3/uL (ref 0.0–0.1)
EOS%: 1.9 % (ref 0.0–7.0)
HGB: 12.3 g/dL — ABNORMAL LOW (ref 13.0–17.1)
MCH: 31.8 pg (ref 27.2–33.4)
MCV: 90.4 fL (ref 79.3–98.0)
MONO%: 4.7 % (ref 0.0–14.0)
RBC: 3.87 10*6/uL — ABNORMAL LOW (ref 4.20–5.82)
RDW: 12.8 % (ref 11.0–14.6)
lymph#: 2.3 10*3/uL (ref 0.9–3.3)
nRBC: 0 % (ref 0–0)

## 2011-12-23 NOTE — Progress Notes (Signed)
hgb 12.3, procrit not indicated. Pt given copy of labs.  dmr

## 2011-12-26 ENCOUNTER — Telehealth: Payer: Self-pay | Admitting: *Deleted

## 2011-12-26 NOTE — Telephone Encounter (Signed)
Call from [place] at [facility] requesting original path and surgical report from 02/2001. Request forwarded to HIM.

## 2012-01-06 ENCOUNTER — Ambulatory Visit (HOSPITAL_BASED_OUTPATIENT_CLINIC_OR_DEPARTMENT_OTHER): Payer: Managed Care, Other (non HMO) | Admitting: Oncology

## 2012-01-06 ENCOUNTER — Telehealth: Payer: Self-pay | Admitting: Oncology

## 2012-01-06 ENCOUNTER — Other Ambulatory Visit (HOSPITAL_BASED_OUTPATIENT_CLINIC_OR_DEPARTMENT_OTHER): Payer: Managed Care, Other (non HMO) | Admitting: Lab

## 2012-01-06 VITALS — BP 152/72 | HR 64 | Temp 97.5°F | Resp 20 | Ht 73.0 in | Wt 198.4 lb

## 2012-01-06 DIAGNOSIS — D649 Anemia, unspecified: Secondary | ICD-10-CM

## 2012-01-06 DIAGNOSIS — D696 Thrombocytopenia, unspecified: Secondary | ICD-10-CM

## 2012-01-06 DIAGNOSIS — N289 Disorder of kidney and ureter, unspecified: Secondary | ICD-10-CM

## 2012-01-06 LAB — CBC WITH DIFFERENTIAL/PLATELET
BASO%: 0.2 % (ref 0.0–2.0)
Basophils Absolute: 0 10e3/uL (ref 0.0–0.1)
EOS%: 1.5 % (ref 0.0–7.0)
Eosinophils Absolute: 0.1 10e3/uL (ref 0.0–0.5)
HCT: 34.4 % — ABNORMAL LOW (ref 38.4–49.9)
HGB: 11.9 g/dL — ABNORMAL LOW (ref 13.0–17.1)
LYMPH%: 19.7 % (ref 14.0–49.0)
MCH: 31.2 pg (ref 27.2–33.4)
MCHC: 34.6 g/dL (ref 32.0–36.0)
MCV: 90.1 fL (ref 79.3–98.0)
MONO#: 0.5 10e3/uL (ref 0.1–0.9)
MONO%: 5.4 % (ref 0.0–14.0)
NEUT#: 6.8 10e3/uL — ABNORMAL HIGH (ref 1.5–6.5)
NEUT%: 73.2 % (ref 39.0–75.0)
Platelets: 119 10e3/uL — ABNORMAL LOW (ref 140–400)
RBC: 3.82 10e6/uL — ABNORMAL LOW (ref 4.20–5.82)
RDW: 12.6 % (ref 11.0–14.6)
WBC: 9.3 10e3/uL (ref 4.0–10.3)
lymph#: 1.8 10e3/uL (ref 0.9–3.3)
nRBC: 0 % (ref 0–0)

## 2012-01-06 NOTE — Progress Notes (Signed)
   [place]    OFFICE PROGRESS NOTE   INTERVAL HISTORY:   He returns as scheduled. He last received Procrit on 11/25/2011. No dyspnea. He reports a good energy level. He has nausea in the mornings and intermittent emesis. He relates this to renal failure.  He has been evaluated at the [facility] renal transplant clinic. He continues close followup with Dr. Lorrene Reid and has not started dialysis.  Objective:  Vital signs in last 24 hours:  Blood pressure 152/72, pulse 64, temperature 97.5 F (36.4 C), temperature source Oral, resp. rate 20, height 6\' 1"  (1.854 m), weight 198 lb 6.4 oz (89.994 kg).   Resp: Lungs clear bilaterally Cardio: Regular rate and rhythm GI: No hepatosplenomegaly, no mass Vascular: No leg edema   Lab Results:  Lab Results  Component Value Date   WBC 9.3 01/06/2012   HGB 11.9* 01/06/2012   HCT 34.4* 01/06/2012   MCV 90.1 01/06/2012   PLT 119* 01/06/2012   ANC 6.8   Medications: I have reviewed the patient's current medications.  Assessment/Plan: 1. Normocytic anemia, status post a nondiagnostic bone marrow biopsy, 10/10/2010. a. Status post 3 weeks of Procrit therapy with significant improvement in the anemia (10/29/2010, 11/05/2010 and 11/12/2010). b. He received a Procrit injection on 03/19/2011 when the hemoglobin returned at 9.9. c. The hemoglobin returned lower 07/02/2011. Procrit therapy was resumed. He was transfused 2 units of blood on 07/03/2011. 2. 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 3. Renal insufficiency, status post a biopsy, 11/09/2010, confirming glomerulosclerosis and arteriosclerosis. He continues followup with Dr. Lorrene Reid. The renal failure has progressed and he underwent placement of an AV fistula on 06/07/2011. He is now undergoing a renal transplant evaluation. 4. Diabetes. 5. Hypertension. 6. Elevated free kappa and lambda light chains. 7. Mild thrombocytopenia status post a nondiagnostic bone marrow biopsy  10/10/2010. Stable  Disposition:  He appears stable from a hematologic standpoint. The anemia response to erythropoietin therapy. The goal is to maintain the hemoglobin at approximately 10-11. He will return for a CBC and Procrit as indicated in 2 weeks. He will then return for a monthly CBC and Procrit as indicated. Mr. Mcguane will be scheduled for an office visit in 3 months.  He continues close followup with Dr. Lorrene Reid for management of the end-stage renal disease.   Betsy Coder, MD  01/06/2012  12:06 PM

## 2012-01-06 NOTE — Telephone Encounter (Signed)
Gave pt appt calendar for September, October and November 2013 lab , injections and ML

## 2012-01-09 ENCOUNTER — Telehealth: Payer: Self-pay | Admitting: Oncology

## 2012-01-09 NOTE — Telephone Encounter (Signed)
Pt called r/s lab and injection from 9/4 to 01/23/12 lab and injection

## 2012-01-22 ENCOUNTER — Ambulatory Visit: Payer: Managed Care, Other (non HMO)

## 2012-01-22 ENCOUNTER — Other Ambulatory Visit: Payer: Managed Care, Other (non HMO) | Admitting: Lab

## 2012-01-23 ENCOUNTER — Telehealth: Payer: Self-pay | Admitting: *Deleted

## 2012-01-23 ENCOUNTER — Ambulatory Visit: Payer: Managed Care, Other (non HMO)

## 2012-01-23 ENCOUNTER — Other Ambulatory Visit: Payer: Managed Care, Other (non HMO) | Admitting: Lab

## 2012-01-23 NOTE — Telephone Encounter (Signed)
Called pt about [place].  States that he called this AM and cancelled due to being out of town.  He will call and reschedule when he returns.

## 2012-02-20 ENCOUNTER — Other Ambulatory Visit (HOSPITAL_BASED_OUTPATIENT_CLINIC_OR_DEPARTMENT_OTHER): Payer: Managed Care, Other (non HMO)

## 2012-02-20 ENCOUNTER — Ambulatory Visit (HOSPITAL_BASED_OUTPATIENT_CLINIC_OR_DEPARTMENT_OTHER): Payer: Managed Care, Other (non HMO)

## 2012-02-20 VITALS — BP 128/66 | HR 65 | Temp 97.4°F

## 2012-02-20 DIAGNOSIS — N289 Disorder of kidney and ureter, unspecified: Secondary | ICD-10-CM

## 2012-02-20 DIAGNOSIS — N186 End stage renal disease: Secondary | ICD-10-CM

## 2012-02-20 DIAGNOSIS — D649 Anemia, unspecified: Secondary | ICD-10-CM

## 2012-02-20 LAB — CBC WITH DIFFERENTIAL/PLATELET
BASO%: 0.2 % (ref 0.0–2.0)
EOS%: 1.9 % (ref 0.0–7.0)
LYMPH%: 20.8 % (ref 14.0–49.0)
MCH: 30 pg (ref 27.2–33.4)
MCHC: 33.3 g/dL (ref 32.0–36.0)
MONO#: 0.4 10*3/uL (ref 0.1–0.9)
Platelets: 115 10*3/uL — ABNORMAL LOW (ref 140–400)
RBC: 2.6 10*6/uL — ABNORMAL LOW (ref 4.20–5.82)
WBC: 8.4 10*3/uL (ref 4.0–10.3)
lymph#: 1.7 10*3/uL (ref 0.9–3.3)
nRBC: 0 % (ref 0–0)

## 2012-02-20 MED ORDER — EPOETIN ALFA 40000 UNIT/ML IJ SOLN
40000.0000 [IU] | Freq: Once | INTRAMUSCULAR | Status: AC
  Administered 2012-02-20: 40000 [IU] via SUBCUTANEOUS
  Filled 2012-02-20: qty 1

## 2012-02-21 ENCOUNTER — Telehealth: Payer: Self-pay | Admitting: Medical Oncology

## 2012-02-21 DIAGNOSIS — D649 Anemia, unspecified: Secondary | ICD-10-CM

## 2012-02-21 NOTE — Telephone Encounter (Signed)
Pt notified of cbc /procrit in one week -scheduled both for 10/10

## 2012-02-27 ENCOUNTER — Other Ambulatory Visit (HOSPITAL_BASED_OUTPATIENT_CLINIC_OR_DEPARTMENT_OTHER): Payer: Managed Care, Other (non HMO)

## 2012-02-27 ENCOUNTER — Ambulatory Visit (HOSPITAL_BASED_OUTPATIENT_CLINIC_OR_DEPARTMENT_OTHER): Payer: Managed Care, Other (non HMO)

## 2012-02-27 VITALS — BP 127/68 | HR 63 | Temp 97.4°F

## 2012-02-27 DIAGNOSIS — N189 Chronic kidney disease, unspecified: Secondary | ICD-10-CM

## 2012-02-27 DIAGNOSIS — N186 End stage renal disease: Secondary | ICD-10-CM

## 2012-02-27 DIAGNOSIS — D649 Anemia, unspecified: Secondary | ICD-10-CM

## 2012-02-27 DIAGNOSIS — I129 Hypertensive chronic kidney disease with stage 1 through stage 4 chronic kidney disease, or unspecified chronic kidney disease: Secondary | ICD-10-CM

## 2012-02-27 LAB — CBC WITH DIFFERENTIAL/PLATELET
BASO%: 0.2 % (ref 0.0–2.0)
Basophils Absolute: 0 10*3/uL (ref 0.0–0.1)
EOS%: 1.7 % (ref 0.0–7.0)
HGB: 8.2 g/dL — ABNORMAL LOW (ref 13.0–17.1)
MCH: 30.8 pg (ref 27.2–33.4)
MCV: 94.4 fL (ref 79.3–98.0)
MONO%: 8.7 % (ref 0.0–14.0)
RBC: 2.66 10*6/uL — ABNORMAL LOW (ref 4.20–5.82)
RDW: 14.9 % — ABNORMAL HIGH (ref 11.0–14.6)
lymph#: 1.5 10*3/uL (ref 0.9–3.3)

## 2012-02-27 MED ORDER — EPOETIN ALFA 20000 UNIT/ML IJ SOLN
40000.0000 [IU] | Freq: Once | INTRAMUSCULAR | Status: AC
  Administered 2012-02-27: 40000 [IU] via SUBCUTANEOUS
  Filled 2012-02-27: qty 2

## 2012-03-03 ENCOUNTER — Telehealth: Payer: Self-pay | Admitting: *Deleted

## 2012-03-03 ENCOUNTER — Other Ambulatory Visit: Payer: Self-pay | Admitting: *Deleted

## 2012-03-03 NOTE — Telephone Encounter (Signed)
Patient confirmed over the phone the new date and time on 03-12-2012 starting at 8:00am

## 2012-03-03 NOTE — Telephone Encounter (Signed)
Called patient; per Dr. Benay Spice labs drawn 02/27/2012 show improved H+H.  PAtient to come back in approximately two weeks to re-check values and receive Procrit.  Patient verbalized understanding.  POF submitted.

## 2012-03-12 ENCOUNTER — Other Ambulatory Visit (HOSPITAL_BASED_OUTPATIENT_CLINIC_OR_DEPARTMENT_OTHER): Payer: Managed Care, Other (non HMO) | Admitting: Lab

## 2012-03-12 ENCOUNTER — Ambulatory Visit: Payer: Managed Care, Other (non HMO)

## 2012-03-12 DIAGNOSIS — N186 End stage renal disease: Secondary | ICD-10-CM

## 2012-03-12 DIAGNOSIS — D649 Anemia, unspecified: Secondary | ICD-10-CM

## 2012-03-12 LAB — CBC WITH DIFFERENTIAL/PLATELET
BASO%: 0.3 % (ref 0.0–2.0)
EOS%: 2.7 % (ref 0.0–7.0)
HCT: 33.3 % — ABNORMAL LOW (ref 38.4–49.9)
MCH: 32.8 pg (ref 27.2–33.4)
MCHC: 33.6 g/dL (ref 32.0–36.0)
MONO#: 0.4 10*3/uL (ref 0.1–0.9)
NEUT%: 67.6 % (ref 39.0–75.0)
RDW: 14.1 % (ref 11.0–14.6)
WBC: 6.6 10*3/uL (ref 4.0–10.3)
lymph#: 1.5 10*3/uL (ref 0.9–3.3)

## 2012-03-12 MED ORDER — EPOETIN ALFA 20000 UNIT/ML IJ SOLN: 40000.0000 [IU] | Freq: Once | INTRAMUSCULAR | Status: DC

## 2012-03-18 ENCOUNTER — Ambulatory Visit: Payer: Managed Care, Other (non HMO)

## 2012-03-18 ENCOUNTER — Other Ambulatory Visit (HOSPITAL_BASED_OUTPATIENT_CLINIC_OR_DEPARTMENT_OTHER): Payer: Managed Care, Other (non HMO) | Admitting: Lab

## 2012-03-18 DIAGNOSIS — D649 Anemia, unspecified: Secondary | ICD-10-CM

## 2012-03-18 DIAGNOSIS — N186 End stage renal disease: Secondary | ICD-10-CM

## 2012-03-18 LAB — CBC WITH DIFFERENTIAL/PLATELET
Basophils Absolute: 0 10*3/uL (ref 0.0–0.1)
EOS%: 2.7 % (ref 0.0–7.0)
Eosinophils Absolute: 0.2 10*3/uL (ref 0.0–0.5)
HCT: 31.6 % — ABNORMAL LOW (ref 38.4–49.9)
HGB: 10.5 g/dL — ABNORMAL LOW (ref 13.0–17.1)
MCH: 31.8 pg (ref 27.2–33.4)
MONO#: 0.4 10*3/uL (ref 0.1–0.9)
NEUT#: 3.2 10*3/uL (ref 1.5–6.5)
NEUT%: 56.3 % (ref 39.0–75.0)
RDW: 13 % (ref 11.0–14.6)
WBC: 5.6 10*3/uL (ref 4.0–10.3)
lymph#: 1.9 10*3/uL (ref 0.9–3.3)

## 2012-03-18 MED ORDER — EPOETIN ALFA 40000 UNIT/ML IJ SOLN
40000.0000 [IU] | Freq: Once | INTRAMUSCULAR | Status: DC
  Filled 2012-03-18: qty 1

## 2012-04-14 ENCOUNTER — Telehealth: Payer: Self-pay | Admitting: Oncology

## 2012-04-14 ENCOUNTER — Ambulatory Visit (HOSPITAL_BASED_OUTPATIENT_CLINIC_OR_DEPARTMENT_OTHER): Payer: Managed Care, Other (non HMO) | Admitting: Nurse Practitioner

## 2012-04-14 ENCOUNTER — Other Ambulatory Visit (HOSPITAL_BASED_OUTPATIENT_CLINIC_OR_DEPARTMENT_OTHER): Payer: Managed Care, Other (non HMO)

## 2012-04-14 VITALS — BP 150/67 | HR 78 | Temp 98.4°F | Resp 20 | Ht 73.0 in | Wt 204.1 lb

## 2012-04-14 DIAGNOSIS — D696 Thrombocytopenia, unspecified: Secondary | ICD-10-CM

## 2012-04-14 DIAGNOSIS — N186 End stage renal disease: Secondary | ICD-10-CM

## 2012-04-14 DIAGNOSIS — D649 Anemia, unspecified: Secondary | ICD-10-CM

## 2012-04-14 DIAGNOSIS — R799 Abnormal finding of blood chemistry, unspecified: Secondary | ICD-10-CM

## 2012-04-14 LAB — CBC WITH DIFFERENTIAL/PLATELET
Basophils Absolute: 0 10*3/uL (ref 0.0–0.1)
Eosinophils Absolute: 0.2 10*3/uL (ref 0.0–0.5)
HGB: 10.9 g/dL — ABNORMAL LOW (ref 13.0–17.1)
LYMPH%: 34.3 % (ref 14.0–49.0)
MCV: 90.1 fL (ref 79.3–98.0)
MONO#: 0.5 10*3/uL (ref 0.1–0.9)
NEUT#: 4.7 10*3/uL (ref 1.5–6.5)
Platelets: 108 10*3/uL — ABNORMAL LOW (ref 140–400)
RBC: 3.53 10*6/uL — ABNORMAL LOW (ref 4.20–5.82)
WBC: 8.2 10*3/uL (ref 4.0–10.3)
nRBC: 0 % (ref 0–0)

## 2012-04-14 NOTE — Progress Notes (Signed)
OFFICE PROGRESS NOTE  Interval history:  Mr. Stankus returns as scheduled. He last received a Procrit injection on 02/27/2012 at which time the hemoglobin was 8.2. The hemoglobin returned at 11.2 on 03/12/2012 and 10.5 on 03/18/2012.   Energy level continues to be poor. He has occasional mild nausea in the mornings. He continues followup with Dr. Lorrene Reid. He has not started dialysis. He reports he is on the transplant list at [facility].  His wife recently noted that the palm of the left hand was blue. The discoloration resolved spontaneously. There was no associated pain.   Objective: Blood pressure 150/67, pulse 78, temperature 98.4 F (36.9 C), temperature source Oral, resp. rate 20, height 6\' 1"  (1.854 m), weight 204 lb 1.6 oz (92.579 kg).  Oropharynx is without thrush or ulceration. Lungs are clear. Regular cardiac rhythm. Abdomen soft and nontender. No organomegaly. Extremities are without edema.  Lab Results: Lab Results  Component Value Date   WBC 8.2 04/14/2012   HGB 10.9* 04/14/2012   HCT 31.8* 04/14/2012   MCV 90.1 04/14/2012   PLT 108* 04/14/2012    Chemistry:    Chemistry      Component Value Date/Time   NA 147* 06/07/2011 0737   K 4.8 06/07/2011 0737   CL 106 03/19/2011 1502   CO2 23 03/19/2011 1502   BUN 51* 03/19/2011 1502   CREATININE 3.22* 03/19/2011 1502      Component Value Date/Time   CALCIUM 9.2 03/19/2011 1502   ALKPHOS 59 12/27/2005 0847   AST 17 12/27/2005 0847   ALT 11 12/27/2005 0847   BILITOT 0.7 12/27/2005 0847       Studies/Results: No results found.  Medications: I have reviewed the patient's current medications.  Assessment/Plan:  1. Normocytic anemia, status post a nondiagnostic bone marrow biopsy, 10/10/2010. a. Status post 3 weeks of Procrit therapy with significant improvement in the anemia (10/29/2010, 11/05/2010 and 11/12/2010). b. He received a Procrit injection on 03/19/2011 when the hemoglobin returned at 9.9. c. The hemoglobin  returned lower 07/02/2011. Procrit therapy was resumed. He was transfused 2 units of blood on 07/03/2011. 2. 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 3. Renal insufficiency, status post a biopsy, 11/09/2010, confirming glomerulosclerosis and arteriosclerosis. He continues followup with Dr. Lorrene Reid. The renal failure has progressed and he underwent placement of an AV fistula on 06/07/2011. He is now undergoing a renal transplant evaluation. 4. Diabetes. 5. Hypertension. 6. Elevated free kappa and lambda light chains. 7. Mild thrombocytopenia status post a nondiagnostic bone marrow biopsy 10/10/2010. Stable.  Disposition-Mr. Showell remains stable from a hematologic standpoint. The anemia has responded to erythropoietin therapy. He last received a Procrit injection on 02/27/2012. Followup hemoglobin values have been in the goal range. We will continue to check CBCs on a 2 week schedule with Procrit as indicated. He will return for a followup visit in 3 months. He will contact the office in the interim with any problems. With regard to the recent blue discoloration of the left palm, we instructed him to contact either Dr. Lorrene Reid or the surgeon who placed the fistula with further similar occurrences.   Plan reviewed with Dr. Benay Spice.  Ned Card ANP/GNP-BC

## 2012-04-14 NOTE — Telephone Encounter (Signed)
appts made and printed for pt aom °

## 2012-04-28 ENCOUNTER — Other Ambulatory Visit (HOSPITAL_BASED_OUTPATIENT_CLINIC_OR_DEPARTMENT_OTHER): Payer: Managed Care, Other (non HMO) | Admitting: Lab

## 2012-04-28 ENCOUNTER — Ambulatory Visit (HOSPITAL_BASED_OUTPATIENT_CLINIC_OR_DEPARTMENT_OTHER): Payer: Managed Care, Other (non HMO)

## 2012-04-28 VITALS — BP 143/73 | HR 64 | Temp 97.8°F

## 2012-04-28 DIAGNOSIS — D649 Anemia, unspecified: Secondary | ICD-10-CM

## 2012-04-28 DIAGNOSIS — N186 End stage renal disease: Secondary | ICD-10-CM

## 2012-04-28 LAB — CBC WITH DIFFERENTIAL/PLATELET
Eosinophils Absolute: 0.1 10*3/uL (ref 0.0–0.5)
LYMPH%: 20.6 % (ref 14.0–49.0)
MONO#: 0.9 10*3/uL (ref 0.1–0.9)
NEUT#: 5.1 10*3/uL (ref 1.5–6.5)
Platelets: 111 10*3/uL — ABNORMAL LOW (ref 140–400)
RBC: 3.38 10*6/uL — ABNORMAL LOW (ref 4.20–5.82)
RDW: 12.1 % (ref 11.0–14.6)
WBC: 7.7 10*3/uL (ref 4.0–10.3)
nRBC: 0 % (ref 0–0)

## 2012-04-28 MED ORDER — EPOETIN ALFA 20000 UNIT/ML IJ SOLN
40000.0000 [IU] | Freq: Once | INTRAMUSCULAR | Status: AC
  Administered 2012-04-28: 40000 [IU] via SUBCUTANEOUS
  Filled 2012-04-28: qty 2

## 2012-05-12 ENCOUNTER — Ambulatory Visit: Payer: Managed Care, Other (non HMO)

## 2012-05-12 ENCOUNTER — Other Ambulatory Visit (HOSPITAL_BASED_OUTPATIENT_CLINIC_OR_DEPARTMENT_OTHER): Payer: Managed Care, Other (non HMO)

## 2012-05-12 DIAGNOSIS — D649 Anemia, unspecified: Secondary | ICD-10-CM

## 2012-05-12 DIAGNOSIS — N186 End stage renal disease: Secondary | ICD-10-CM

## 2012-05-12 LAB — CBC WITH DIFFERENTIAL/PLATELET
Basophils Absolute: 0 10*3/uL (ref 0.0–0.1)
Eosinophils Absolute: 0.2 10*3/uL (ref 0.0–0.5)
HCT: 35.2 % — ABNORMAL LOW (ref 38.4–49.9)
HGB: 11.6 g/dL — ABNORMAL LOW (ref 13.0–17.1)
MONO#: 0.7 10*3/uL (ref 0.1–0.9)
NEUT#: 4.7 10*3/uL (ref 1.5–6.5)
NEUT%: 70.3 % (ref 39.0–75.0)
WBC: 6.8 10*3/uL (ref 4.0–10.3)
lymph#: 1.1 10*3/uL (ref 0.9–3.3)

## 2012-05-12 MED ORDER — EPOETIN ALFA 20000 UNIT/ML IJ SOLN: 40000.0000 [IU] | Freq: Once | INTRAMUSCULAR | Status: DC

## 2012-05-18 ENCOUNTER — Telehealth: Payer: Self-pay

## 2012-05-18 ENCOUNTER — Encounter: Payer: Self-pay | Admitting: Vascular Surgery

## 2012-05-18 DIAGNOSIS — R29898 Other symptoms and signs involving the musculoskeletal system: Secondary | ICD-10-CM

## 2012-05-18 DIAGNOSIS — R200 Anesthesia of skin: Secondary | ICD-10-CM

## 2012-05-18 NOTE — Telephone Encounter (Signed)
Nurse from [facility] called to inquire why pt. was told on 05/08/12 that he needed a referral for problems with his left arm that AVF was placed by [facility] 05/2011.  Phone call made to pt.  Inquired with pt. who he spoke with on 05/08/12 when he called the [facility] office.  He said he was not sure of the name of the individual.   Reported that he is having a tingling and numbness in left index finger and thumb, and has noticed decreased grip strength in left hand.  Denies pain in left hand with gripping.  States he specifically has problems turning a doorknob.  Reports recently seeing his PCP at [facility];  was told that his left hand had atrophied, and needed to contact our office.  Discussed with Dr. Kellie Simmering. Rec'd new order for duplex of left arm and a steal study.  Recommended to get pt. In with the MD that placed the AVF.  Will offer pt. Appt. This week, since Dr. Scot Dock is not in office until 05/27/12.

## 2012-05-19 ENCOUNTER — Encounter (INDEPENDENT_AMBULATORY_CARE_PROVIDER_SITE_OTHER): Payer: Managed Care, Other (non HMO) | Admitting: *Deleted

## 2012-05-19 ENCOUNTER — Encounter (HOSPITAL_COMMUNITY): Payer: Self-pay | Admitting: Pharmacy Technician

## 2012-05-19 ENCOUNTER — Ambulatory Visit: Payer: Managed Care, Other (non HMO) | Admitting: Vascular Surgery

## 2012-05-19 ENCOUNTER — Ambulatory Visit (INDEPENDENT_AMBULATORY_CARE_PROVIDER_SITE_OTHER): Payer: Managed Care, Other (non HMO) | Admitting: Vascular Surgery

## 2012-05-19 ENCOUNTER — Encounter: Payer: Self-pay | Admitting: Vascular Surgery

## 2012-05-19 ENCOUNTER — Other Ambulatory Visit: Payer: Self-pay

## 2012-05-19 VITALS — BP 149/74 | HR 63 | Resp 18 | Ht 74.0 in | Wt 184.0 lb

## 2012-05-19 DIAGNOSIS — R200 Anesthesia of skin: Secondary | ICD-10-CM

## 2012-05-19 DIAGNOSIS — N186 End stage renal disease: Secondary | ICD-10-CM

## 2012-05-19 DIAGNOSIS — R29898 Other symptoms and signs involving the musculoskeletal system: Secondary | ICD-10-CM

## 2012-05-19 DIAGNOSIS — T82598A Other mechanical complication of other cardiac and vascular devices and implants, initial encounter: Secondary | ICD-10-CM

## 2012-05-19 DIAGNOSIS — T82898A Other specified complication of vascular prosthetic devices, implants and grafts, initial encounter: Secondary | ICD-10-CM | POA: Insufficient documentation

## 2012-05-19 DIAGNOSIS — Z4931 Encounter for adequacy testing for hemodialysis: Secondary | ICD-10-CM

## 2012-05-19 DIAGNOSIS — R209 Unspecified disturbances of skin sensation: Secondary | ICD-10-CM

## 2012-05-19 DIAGNOSIS — Z48812 Encounter for surgical aftercare following surgery on the circulatory system: Secondary | ICD-10-CM

## 2012-05-19 NOTE — Progress Notes (Signed)
Subjective:     Patient ID: Paul [name], male   DOB: [birth year], 56 y.o.   MRN: [id number]  HPI this 56 year old male is referred today for possible steal syndrome left upper extremity. Patient has chronic renal insufficiency but is not yet on hemodialysis. He had a left brachial-cephalic AV fistula created by Dr. Doren Custard in January of 2013. He was seen back on 2 occasions. Patient states that he has had some numbness in the left hand since the surgery but symptoms have progressed over the past year and he hasn't noted significant weakness in the left hand particularly while opening doorknobs. He also has had persistent numbness in the left hand in the thumb index and middle finger primarily. He has no symptoms in the right hand. He is approaching the need for hemodialysis.  Past Medical History  Diagnosis Date  . Diabetic neuropathy   . Cancer     History of Duodenal Cancer  . Chronic kidney disease     Pt is not eligible for transplant per [facility]  . Diabetes mellitus     Type 2 iddm x 11 yrs  . Anemia     esrd  . Hypertension     meds d/c 'd  2 wks ago for postural hypotension  . Shortness of breath     exertional  . Fatigue      tires easily x 2 months  . Chronic nausea     with  morning vomiting; better off  HTN meds  . Intermittent tremor   . Closed left arm fracture 1967    History  Substance Use Topics  . Smoking status: Never Smoker   . Smokeless tobacco: Never Used  . Alcohol Use: 0.0 oz/week    0-1 Glasses of wine per week    Family History  Problem Relation Age of Onset  . Cancer Mother   . Cancer Father     prostate cancer  . Heart disease Maternal Grandfather   . Stroke Paternal Grandmother     Allergies  Allergen Reactions  . Metformin And Related     Kidney problems    Current outpatient prescriptions:amLODipine (NORVASC) 10 MG tablet, daily., Disp: , Rfl: ;  aspirin 81 MG tablet, Take 81 mg by mouth daily.  , Disp: , Rfl: ;  atorvastatin (LIPITOR) 10 MG  tablet, Take 10 mg by mouth daily.  , Disp: , Rfl: ;  calcitRIOL (ROCALTROL) 0.25 MCG capsule, Take 0.5 mcg by mouth daily. , Disp: , Rfl: ;  calcium acetate (PHOSLO) 667 MG capsule, 1,334 mg 3 (three) times daily. Takes #3 capsules each dose, Disp: , Rfl:  cholecalciferol (VITAMIN D) 1000 UNITS tablet, Take 1,000 Units by mouth daily., Disp: , Rfl: ;  Epoetin Alfa (PROCRIT IJ), Inject 40,000 Units as directed. , Disp: , Rfl: ;  ferrous sulfate 325 (65 FE) MG tablet, Take 325 mg by mouth 2 (two) times daily.  , Disp: , Rfl: ;  furosemide (LASIX) 80 MG tablet, Take 80 mg by mouth 2 (two) times daily. , Disp: , Rfl:  insulin glargine (LANTUS) 100 UNIT/ML injection, Inject 20 Units into the skin daily. 30-32 units qam, Disp: , Rfl: ;  Omeprazole (PRILOSEC PO), Take by mouth daily., Disp: , Rfl: ;  sodium bicarbonate 650 MG tablet, Take 1,950 mg by mouth 3 (three) times daily. Takes #2 capsules each dose, Disp: , Rfl:   BP 149/74  Pulse 63  Resp 18  Ht 6\' 2"  (1.88 m)  Wt  184 lb (83.462 kg)  BMI 23.62 kg/m2  Body mass index is 23.62 kg/(m^2).        Review of Systems denies chest pain, dyspnea on exertion, PND, orthopnea, claudication     Objective:   Physical Exam blood pressure 149/74 heart rate 63 respirations 18 General well-developed well-nourished male in no apparent stress alert and oriented x3 Lungs no rhonchi or wheezing Left upper extremity with brachial-cephalic fistula with excellent pulse and palpable thrill. Fistula is quite dilated near the brachial artery anastomosis. Radial pulse is 1-2+ which improves with compression of the fistula. No ischemic lesions noted in the left hand. He does have marked hypo-thenar atrophy left hand. Right upper extremity is well-perfused with 3+ brachial and radial pulse.  Today I ordered a duplex scan of the brachial cephalic fistula and Doppler studies in the left upper extremity. There is a significant steal present. The pressure distally in the  left radial artery improves from 0.4-1.0 with compression and there are very dampened waveforms in the digits at rest.     Assessment:     Chronic renal insufficiency with significant steal syndrome left upper extremity with brachial cephalic fistula created one year ago-hypo-thenar atrophy left hand    Plan:     Plan ligation left brachial cephalic AV fistula this week with possible insertion of hemodialysis catheter depending on discussion with Dr. Lorrene Reid later today depending on how sitting patient will need hemodialysis Patient will then need to be evaluated for right upper extremity AV fistula in near future

## 2012-05-21 ENCOUNTER — Encounter (HOSPITAL_COMMUNITY): Payer: Self-pay

## 2012-05-21 ENCOUNTER — Encounter (HOSPITAL_COMMUNITY)
Admission: RE | Admit: 2012-05-21 | Discharge: 2012-05-21 | Disposition: A | Discharge status: Home / Self Care | Payer: Managed Care, Other (non HMO) | Source: Ambulatory Visit | Attending: Vascular Surgery | Admitting: Vascular Surgery

## 2012-05-21 HISTORY — DX: Injury of conjunctiva and corneal abrasion without foreign body, left eye, initial encounter: S05.02XA

## 2012-05-21 HISTORY — DX: Cardiac murmur, unspecified: R01.1

## 2012-05-21 HISTORY — DX: Gastro-esophageal reflux disease without esophagitis: K21.9

## 2012-05-21 LAB — SURGICAL PCR SCREEN: MRSA, PCR: NEGATIVE

## 2012-05-21 MED ORDER — CEFAZOLIN SODIUM 1-5 GM-% IV SOLN
1.0000 g | Freq: Once | INTRAVENOUS | Status: DC
  Filled 2012-05-21 (×2): qty 50

## 2012-05-21 NOTE — Progress Notes (Addendum)
Patients primary care physician, Dr. Dorthy Cooler, [name].  Patient is on kidney transplant list, at [facility], Patient is followed by Jamal Maes.

## 2012-05-21 NOTE — Consult Note (Signed)
Anesthesia Chart Review:  Patient is a 57 year old male scheduled for ligation of left brachial cephalic AVG (due to steal syndrome) and insertion of a hemodialysis catheter on 05/23/11.  He is s/p creation of his AVF on 06/07/11.   History includes ESRD not yet on hemodialysis, DM2, anemia, HTN, GERD, murmur with mild-moderate MR by 11/2011 echo, intermittent tremor, duodenal cancer, hx of Whipple procedure. Nephrologist is Dr. Lorrene Reid.  (Patient told his PAT RN that he was on the transplant list at [facility].)  PCP is Dr. Dorthy Cooler.  EKG on 06/06/11 showed NSR, possible septal infarct. I don't think his EKG has significantly changed since 03/19/01 (see [facility]) or from 08/24/08 [facility]).   Echo on 11/28/11 [facility]) showed mild left ventricular dilation, left ventricular EF estimated at 61%, mild left atrial enlargement, mild calcification of aortic valve, aortic valve is sclerotic but opens well, mild tricuspid regurgitation, right ventricular systolic pressure estimated at 35-40 mmHg, analysis of mitral valve inflow, pulmonary vein Doppler and tissue Doppler suggests grade 2 diastolic dysfunction with elevated left atrial pressure, mild to moderate mitral valve regurgitation.  Nuclear stress test on 11/28/11 [facility]) showed normal myocardial perfusion scan demonstrating an attenuation artifact in inferior region of the myocardium. No ischemia or infarct/scar is seen in the remaining myocardium. Post stress EF 57%.  06/06/11 CXR showed emphysematous changes without infiltrate.   Labs from 05/12/12 noted.  He is for an ISTAT on the day of surgery.  If labs are reasonable then anticipate he can proceed as planned.  Myra Gianotti, PA-C 05/21/12 1537

## 2012-05-21 NOTE — Progress Notes (Addendum)
[facility] cardiology [phone], spoke with Vinnie Level, requested stress test results, echo, and EKG.  Forwarded to anesthesia for review.

## 2012-05-21 NOTE — Pre-Procedure Instructions (Signed)
[place]  05/21/2012   Your procedure is scheduled on:  Friday May 22, 2012  Report to [facility] at 5:30 AM.  Call this number if you have problems the morning of surgery: [phone]   Remember:   Do not eat food or drink :After Midnight.    Take these medicines the morning of surgery with A SIP OF WATER: amlodipine, reglan, omeprazole   Do not wear jewelry, make-up or nail polish.  Do not wear lotions, powders, or perfumes.   Do not shave 48 hours prior to surgery. Men may shave face and neck.  Do not bring valuables to the hospital.  Contacts, dentures or bridgework may not be worn into surgery.  Leave suitcase in the car. After surgery it may be brought to your room.  For patients admitted to the hospital, checkout time is 11:00 AM the day of discharge.   Patients discharged the day of surgery will not be allowed to drive home.  Name and phone number of your driver: family / friend  Special Instructions: Shower using CHG 2 nights before surgery and the night before surgery.  If you shower the day of surgery use CHG.  Use special wash - you have one bottle of CHG for all showers.  You should use approximately 1/3 of the bottle for each shower.   Please read over the following fact sheets that you were given: Pain Booklet, Coughing and Deep Breathing, MRSA Information and Surgical Site Infection Prevention

## 2012-05-22 ENCOUNTER — Encounter (HOSPITAL_COMMUNITY)
Admission: RE | Disposition: A | Discharge status: Home / Self Care | Payer: Self-pay | Source: Ambulatory Visit | Attending: Vascular Surgery

## 2012-05-22 ENCOUNTER — Telehealth: Payer: Self-pay | Admitting: Vascular Surgery

## 2012-05-22 ENCOUNTER — Ambulatory Visit (HOSPITAL_COMMUNITY): Payer: Managed Care, Other (non HMO) | Admitting: Vascular Surgery

## 2012-05-22 ENCOUNTER — Encounter (HOSPITAL_COMMUNITY): Payer: Self-pay | Admitting: Vascular Surgery

## 2012-05-22 ENCOUNTER — Ambulatory Visit (HOSPITAL_COMMUNITY)
Admission: RE | Admit: 2012-05-22 | Discharge: 2012-05-22 | Disposition: A | Discharge status: Home / Self Care | Payer: Managed Care, Other (non HMO) | Source: Ambulatory Visit | Attending: Vascular Surgery | Admitting: Vascular Surgery

## 2012-05-22 ENCOUNTER — Ambulatory Visit (HOSPITAL_COMMUNITY): Payer: Managed Care, Other (non HMO)

## 2012-05-22 ENCOUNTER — Encounter (HOSPITAL_COMMUNITY): Payer: Self-pay

## 2012-05-22 ENCOUNTER — Other Ambulatory Visit: Payer: Self-pay | Admitting: *Deleted

## 2012-05-22 DIAGNOSIS — Y832 Surgical operation with anastomosis, bypass or graft as the cause of abnormal reaction of the patient, or of later complication, without mention of misadventure at the time of the procedure: Secondary | ICD-10-CM | POA: Insufficient documentation

## 2012-05-22 DIAGNOSIS — N186 End stage renal disease: Secondary | ICD-10-CM

## 2012-05-22 DIAGNOSIS — Z823 Family history of stroke: Secondary | ICD-10-CM | POA: Insufficient documentation

## 2012-05-22 DIAGNOSIS — T82898A Other specified complication of vascular prosthetic devices, implants and grafts, initial encounter: Secondary | ICD-10-CM | POA: Insufficient documentation

## 2012-05-22 DIAGNOSIS — Z8042 Family history of malignant neoplasm of prostate: Secondary | ICD-10-CM | POA: Insufficient documentation

## 2012-05-22 DIAGNOSIS — E1142 Type 2 diabetes mellitus with diabetic polyneuropathy: Secondary | ICD-10-CM | POA: Insufficient documentation

## 2012-05-22 DIAGNOSIS — I120 Hypertensive chronic kidney disease with stage 5 chronic kidney disease or end stage renal disease: Secondary | ICD-10-CM | POA: Insufficient documentation

## 2012-05-22 DIAGNOSIS — Z8249 Family history of ischemic heart disease and other diseases of the circulatory system: Secondary | ICD-10-CM | POA: Insufficient documentation

## 2012-05-22 DIAGNOSIS — Z794 Long term (current) use of insulin: Secondary | ICD-10-CM | POA: Insufficient documentation

## 2012-05-22 DIAGNOSIS — E1149 Type 2 diabetes mellitus with other diabetic neurological complication: Secondary | ICD-10-CM | POA: Insufficient documentation

## 2012-05-22 HISTORY — PX: INSERTION OF DIALYSIS CATHETER: SHX1324

## 2012-05-22 HISTORY — PX: LIGATION OF ARTERIOVENOUS  FISTULA: SHX5948

## 2012-05-22 LAB — GLUCOSE, CAPILLARY: Glucose-Capillary: 185 mg/dL — ABNORMAL HIGH (ref 70–99)

## 2012-05-22 SURGERY — LIGATION OF ARTERIOVENOUS  FISTULA
Anesthesia: Monitor Anesthesia Care | Site: Neck | Wound class: Clean

## 2012-05-22 MED ORDER — HEPARIN SODIUM (PORCINE) 5000 UNIT/ML IJ SOLN
INTRAMUSCULAR | Status: DC | PRN
  Administered 2012-05-22: 07:00:00

## 2012-05-22 MED ORDER — SODIUM CHLORIDE 0.9 % IV SOLN
INTRAVENOUS | Status: DC | PRN
  Administered 2012-05-22: 07:00:00 via INTRAVENOUS

## 2012-05-22 MED ORDER — PROMETHAZINE HCL 25 MG/ML IJ SOLN: 6.2500 mg | INTRAMUSCULAR | Status: DC | PRN

## 2012-05-22 MED ORDER — MEPERIDINE HCL 25 MG/ML IJ SOLN: 6.2500 mg | INTRAMUSCULAR | Status: DC | PRN

## 2012-05-22 MED ORDER — 0.9 % SODIUM CHLORIDE (POUR BTL) OPTIME
TOPICAL | Status: DC | PRN
  Administered 2012-05-22: 1000 mL

## 2012-05-22 MED ORDER — PROPOFOL INFUSION 10 MG/ML OPTIME
INTRAVENOUS | Status: DC | PRN
  Administered 2012-05-22: 120 ug/kg/min via INTRAVENOUS

## 2012-05-22 MED ORDER — LIDOCAINE-EPINEPHRINE (PF) 1 %-1:200000 IJ SOLN
INTRAMUSCULAR | Status: AC
  Filled 2012-05-22: qty 10

## 2012-05-22 MED ORDER — CEFAZOLIN SODIUM-DEXTROSE 2-3 GM-% IV SOLR: 2.0000 g | Freq: Once | INTRAVENOUS | Status: DC

## 2012-05-22 MED ORDER — HYDROMORPHONE HCL PF 1 MG/ML IJ SOLN: 0.2500 mg | INTRAMUSCULAR | Status: DC | PRN

## 2012-05-22 MED ORDER — LIDOCAINE-EPINEPHRINE (PF) 1 %-1:200000 IJ SOLN
INTRAMUSCULAR | Status: DC | PRN
  Administered 2012-05-22: 30 mL

## 2012-05-22 MED ORDER — MIDAZOLAM HCL 5 MG/5ML IJ SOLN
INTRAMUSCULAR | Status: DC | PRN
  Administered 2012-05-22: 2 mg via INTRAVENOUS

## 2012-05-22 MED ORDER — CEFAZOLIN SODIUM-DEXTROSE 2-3 GM-% IV SOLR
INTRAVENOUS | Status: AC
  Administered 2012-05-22: 2 g via INTRAVENOUS
  Filled 2012-05-22: qty 50

## 2012-05-22 MED ORDER — FENTANYL CITRATE 0.05 MG/ML IJ SOLN
INTRAMUSCULAR | Status: DC | PRN
  Administered 2012-05-22: 50 ug via INTRAVENOUS

## 2012-05-22 MED ORDER — ONDANSETRON HCL 4 MG/2ML IJ SOLN
INTRAMUSCULAR | Status: DC | PRN
  Administered 2012-05-22: 4 mg via INTRAVENOUS

## 2012-05-22 MED ORDER — HEPARIN SODIUM (PORCINE) 1000 UNIT/ML IJ SOLN
INTRAMUSCULAR | Status: AC
  Filled 2012-05-22: qty 1

## 2012-05-22 MED ORDER — OXYCODONE HCL 5 MG PO TABS: 5.0000 mg | ORAL_TABLET | Freq: Once | ORAL | Status: DC | PRN

## 2012-05-22 MED ORDER — SODIUM CHLORIDE 0.9 % IV SOLN
INTRAVENOUS | Status: DC
Start: 2012-05-22 — End: 2012-05-22

## 2012-05-22 MED ORDER — HEPARIN SODIUM (PORCINE) 1000 UNIT/ML IJ SOLN
INTRAMUSCULAR | Status: DC | PRN
  Administered 2012-05-22: 4.2 mL

## 2012-05-22 MED ORDER — OXYCODONE HCL 5 MG PO TABS
5.0000 mg | ORAL_TABLET | ORAL | Status: DC | PRN
Start: 2012-05-22 — End: 2012-06-11

## 2012-05-22 MED ORDER — OXYCODONE HCL 5 MG/5ML PO SOLN: 5.0000 mg | Freq: Once | ORAL | Status: DC | PRN

## 2012-05-22 SURGICAL SUPPLY — 68 items
ADH SKN CLS APL DERMABOND .7 (GAUZE/BANDAGES/DRESSINGS) ×4
BAG DECANTER FOR FLEXI CONT (MISCELLANEOUS) ×3 IMPLANT
CANISTER SUCTION 2500CC (MISCELLANEOUS) ×3 IMPLANT
CATH CANNON HEMO 15F 50CM (CATHETERS) IMPLANT
CATH CANNON HEMO 15FR 19 (HEMODIALYSIS SUPPLIES) ×2 IMPLANT
CATH CANNON HEMO 15FR 23CM (HEMODIALYSIS SUPPLIES) IMPLANT
CATH CANNON HEMO 15FR 31CM (HEMODIALYSIS SUPPLIES) IMPLANT
CATH CANNON HEMO 15FR 32 (HEMODIALYSIS SUPPLIES) IMPLANT
CATH CANNON HEMO 15FR 32CM (HEMODIALYSIS SUPPLIES) IMPLANT
CLIP TI MEDIUM 6 (CLIP) IMPLANT
CLIP TI WIDE RED SMALL 6 (CLIP) IMPLANT
CLOTH BEACON ORANGE TIMEOUT ST (SAFETY) ×4 IMPLANT
COVER PROBE W GEL 5X96 (DRAPES) ×1 IMPLANT
COVER SURGICAL LIGHT HANDLE (MISCELLANEOUS) ×3 IMPLANT
DECANTER SPIKE VIAL GLASS SM (MISCELLANEOUS) ×3 IMPLANT
DERMABOND ADVANCED (GAUZE/BANDAGES/DRESSINGS) ×2
DERMABOND ADVANCED .7 DNX12 (GAUZE/BANDAGES/DRESSINGS) ×2 IMPLANT
DRAPE C-ARM 42X72 X-RAY (DRAPES) ×3 IMPLANT
DRAPE CHEST BREAST 15X10 FENES (DRAPES) ×3 IMPLANT
ELECT REM PT RETURN 9FT ADLT (ELECTROSURGICAL) ×3
ELECTRODE REM PT RTRN 9FT ADLT (ELECTROSURGICAL) ×2 IMPLANT
GAUZE SPONGE 2X2 8PLY STRL LF (GAUZE/BANDAGES/DRESSINGS) ×2 IMPLANT
GAUZE SPONGE 4X4 16PLY XRAY LF (GAUZE/BANDAGES/DRESSINGS) ×3 IMPLANT
GEL ULTRASOUND 20GR AQUASONIC (MISCELLANEOUS) IMPLANT
GLOVE BIOGEL M 6.5 STRL (GLOVE) ×1 IMPLANT
GLOVE BIOGEL PI IND STRL 6.5 (GLOVE) ×4 IMPLANT
GLOVE BIOGEL PI IND STRL 7.0 (GLOVE) IMPLANT
GLOVE BIOGEL PI INDICATOR 6.5 (GLOVE) ×4
GLOVE BIOGEL PI INDICATOR 7.0 (GLOVE) ×1
GLOVE SS BIOGEL STRL SZ 7 (GLOVE) ×2 IMPLANT
GLOVE SUPERSENSE BIOGEL SZ 7 (GLOVE) ×2
GLOVE SURG SS PI 7.0 STRL IVOR (GLOVE) ×2 IMPLANT
GOWN STRL NON-REIN LRG LVL3 (GOWN DISPOSABLE) ×7 IMPLANT
GOWN STRL REIN XL XLG (GOWN DISPOSABLE) ×2 IMPLANT
KIT BASIN OR (CUSTOM PROCEDURE TRAY) ×3 IMPLANT
KIT ROOM TURNOVER OR (KITS) ×3 IMPLANT
NDL 18GX1X1/2 (RX/OR ONLY) (NEEDLE) ×1 IMPLANT
NDL HYPO 25GX1X1/2 BEV (NEEDLE) ×2 IMPLANT
NEEDLE 18GX1X1/2 (RX/OR ONLY) (NEEDLE) ×3 IMPLANT
NEEDLE 22X1 1/2 (OR ONLY) (NEEDLE) ×3 IMPLANT
NEEDLE HYPO 25GX1X1/2 BEV (NEEDLE) ×3 IMPLANT
NS IRRIG 1000ML POUR BTL (IV SOLUTION) ×3 IMPLANT
PACK CV ACCESS (CUSTOM PROCEDURE TRAY) ×3 IMPLANT
PACK SURGICAL SETUP 50X90 (CUSTOM PROCEDURE TRAY) ×2 IMPLANT
PAD ARMBOARD 7.5X6 YLW CONV (MISCELLANEOUS) ×6 IMPLANT
SOAP 2 % CHG 4 OZ (WOUND CARE) ×3 IMPLANT
SPONGE GAUZE 2X2 STER 10/PKG (GAUZE/BANDAGES/DRESSINGS) ×1
SPONGE GAUZE 4X4 12PLY (GAUZE/BANDAGES/DRESSINGS) ×3 IMPLANT
SURGILUBE 3G PEEL PACK STRL (MISCELLANEOUS) ×2 IMPLANT
SUT ETHILON 3 0 PS 1 (SUTURE) ×3 IMPLANT
SUT PROLENE 6 0 BV (SUTURE) ×1 IMPLANT
SUT PROLENE 6 0 C 1 24 (SUTURE) ×2 IMPLANT
SUT SILK 0 TIES 10X30 (SUTURE) ×3 IMPLANT
SUT VIC AB 3-0 SH 27 (SUTURE) ×3
SUT VIC AB 3-0 SH 27X BRD (SUTURE) ×2 IMPLANT
SUT VICRYL 4-0 PS2 18IN ABS (SUTURE) ×3 IMPLANT
SWAB COLLECTION DEVICE MRSA (MISCELLANEOUS) IMPLANT
SYR 20CC LL (SYRINGE) ×3 IMPLANT
SYR 30ML LL (SYRINGE) IMPLANT
SYR 5ML LL (SYRINGE) ×6 IMPLANT
SYR CONTROL 10ML LL (SYRINGE) ×3 IMPLANT
SYRINGE 10CC LL (SYRINGE) ×3 IMPLANT
TAPE CLOTH SURG 4X10 WHT LF (GAUZE/BANDAGES/DRESSINGS) ×1 IMPLANT
TOWEL OR 17X24 6PK STRL BLUE (TOWEL DISPOSABLE) ×4 IMPLANT
TOWEL OR 17X26 10 PK STRL BLUE (TOWEL DISPOSABLE) ×5 IMPLANT
TUBE ANAEROBIC SPECIMEN COL (MISCELLANEOUS) IMPLANT
UNDERPAD 30X30 INCONTINENT (UNDERPADS AND DIAPERS) ×3 IMPLANT
WATER STERILE IRR 1000ML POUR (IV SOLUTION) ×3 IMPLANT

## 2012-05-22 NOTE — H&P (View-Only) (Signed)
Subjective:     Patient ID: Paul [name], male   DOB: [birth year], 57 y.o.   MRN: [id number]  HPI this 57 year old male is referred today for possible steal syndrome left upper extremity. Patient has chronic renal insufficiency but is not yet on hemodialysis. He had a left brachial-cephalic AV fistula created by Dr. Doren Custard in January of 2013. He was seen back on 2 occasions. Patient states that he has had some numbness in the left hand since the surgery but symptoms have progressed over the past year and he hasn't noted significant weakness in the left hand particularly while opening doorknobs. He also has had persistent numbness in the left hand in the thumb index and middle finger primarily. He has no symptoms in the right hand. He is approaching the need for hemodialysis.  Past Medical History  Diagnosis Date  . Diabetic neuropathy   . Cancer     History of Duodenal Cancer  . Chronic kidney disease     Pt is not eligible for transplant per [facility]  . Diabetes mellitus     Type 2 iddm x 11 yrs  . Anemia     esrd  . Hypertension     meds d/c 'd  2 wks ago for postural hypotension  . Shortness of breath     exertional  . Fatigue      tires easily x 2 months  . Chronic nausea     with  morning vomiting; better off  HTN meds  . Intermittent tremor   . Closed left arm fracture 1967    History  Substance Use Topics  . Smoking status: Never Smoker   . Smokeless tobacco: Never Used  . Alcohol Use: 0.0 oz/week    0-1 Glasses of wine per week    Family History  Problem Relation Age of Onset  . Cancer Mother   . Cancer Father     prostate cancer  . Heart disease Maternal Grandfather   . Stroke Paternal Grandmother     Allergies  Allergen Reactions  . Metformin And Related     Kidney problems    Current outpatient prescriptions:amLODipine (NORVASC) 10 MG tablet, daily., Disp: , Rfl: ;  aspirin 81 MG tablet, Take 81 mg by mouth daily.  , Disp: , Rfl: ;  atorvastatin (LIPITOR) 10 MG  tablet, Take 10 mg by mouth daily.  , Disp: , Rfl: ;  calcitRIOL (ROCALTROL) 0.25 MCG capsule, Take 0.5 mcg by mouth daily. , Disp: , Rfl: ;  calcium acetate (PHOSLO) 667 MG capsule, 1,334 mg 3 (three) times daily. Takes #3 capsules each dose, Disp: , Rfl:  cholecalciferol (VITAMIN D) 1000 UNITS tablet, Take 1,000 Units by mouth daily., Disp: , Rfl: ;  Epoetin Alfa (PROCRIT IJ), Inject 40,000 Units as directed. , Disp: , Rfl: ;  ferrous sulfate 325 (65 FE) MG tablet, Take 325 mg by mouth 2 (two) times daily.  , Disp: , Rfl: ;  furosemide (LASIX) 80 MG tablet, Take 80 mg by mouth 2 (two) times daily. , Disp: , Rfl:  insulin glargine (LANTUS) 100 UNIT/ML injection, Inject 20 Units into the skin daily. 30-32 units qam, Disp: , Rfl: ;  Omeprazole (PRILOSEC PO), Take by mouth daily., Disp: , Rfl: ;  sodium bicarbonate 650 MG tablet, Take 1,950 mg by mouth 3 (three) times daily. Takes #2 capsules each dose, Disp: , Rfl:   BP 149/74  Pulse 63  Resp 18  Ht 6\' 2"  (1.88 m)  Wt  184 lb (83.462 kg)  BMI 23.62 kg/m2  Body mass index is 23.62 kg/(m^2).        Review of Systems denies chest pain, dyspnea on exertion, PND, orthopnea, claudication     Objective:   Physical Exam blood pressure 149/74 heart rate 63 respirations 18 General well-developed well-nourished male in no apparent stress alert and oriented x3 Lungs no rhonchi or wheezing Left upper extremity with brachial-cephalic fistula with excellent pulse and palpable thrill. Fistula is quite dilated near the brachial artery anastomosis. Radial pulse is 1-2+ which improves with compression of the fistula. No ischemic lesions noted in the left hand. He does have marked hypo-thenar atrophy left hand. Right upper extremity is well-perfused with 3+ brachial and radial pulse.  Today I ordered a duplex scan of the brachial cephalic fistula and Doppler studies in the left upper extremity. There is a significant steal present. The pressure distally in the  left radial artery improves from 0.4-1.0 with compression and there are very dampened waveforms in the digits at rest.     Assessment:     Chronic renal insufficiency with significant steal syndrome left upper extremity with brachial cephalic fistula created one year ago-hypo-thenar atrophy left hand    Plan:     Plan ligation left brachial cephalic AV fistula this week with possible insertion of hemodialysis catheter depending on discussion with Dr. Lorrene Reid later today depending on how sitting patient will need hemodialysis Patient will then need to be evaluated for right upper extremity AV fistula in near future

## 2012-05-22 NOTE — Interval H&P Note (Signed)
History and Physical Interval Note:  05/22/2012 7:32 AM  Paul [name]  has presented today for surgery, with the diagnosis of End Stage Renal Disease Complication of Arteriovenous Fistula left arm  The various methods of treatment have been discussed with the patient and family. After consideration of risks, benefits and other options for treatment, the patient has consented to  Procedure(s) (LRB) with comments: INSERTION OF DIALYSIS CATHETER (N/A) LIGATION OF ARTERIOVENOUS  FISTULA (Left) - Left bracio-cephalic as a surgical intervention .  The patient's history has been reviewed, patient examined, no change in status, stable for surgery.  I have reviewed the patient's chart and labs.  Questions were answered to the patient's satisfaction.     Tinnie Gens

## 2012-05-22 NOTE — Telephone Encounter (Signed)
Message copied by Gena Fray on Fri May 22, 2012  9:30 AM ------      Message from: Alfonso Patten      Created: Fri May 22, 2012  9:12 AM                   ----- Message -----         From: Richrd Prime, PA         Sent: 05/22/2012   8:20 AM           To: Alfonso Patten, RN            2 -3 week F/U Paul [name]             Needs vein mapping right arm only for new HD access

## 2012-05-22 NOTE — Telephone Encounter (Signed)
Patient still admitted to [facility], could not contact pt- mailed letter to home address with appt information, dpm

## 2012-05-22 NOTE — Op Note (Signed)
OPERATIVE REPORT  Date of Surgery: 05/22/2012  Surgeon: Tinnie Gens, MD  Assistant: Johnette Abraham Pre-op Diagnosis: End stage renal disease with steal syndrome left upper extremity secondary to AV fistula   Postop diagnosis same Procedure: Procedure(s): LIGATION OF ARTERIOVENOUS  FISTULA with repair left brachial artery   Bilateral ultrasound localization internal jugular veins Insertion of hemodialysis catheter right IJ-19 cm Anesthesia: MAC  EBL: Minimal  Complications: None  Procedure Details: The patient was taken to the operating room placed in the supine position at which time the left upper extremity was with Betadine scrub and solution draped in a routine sterile manner. After infiltration with 1% Xylocaine with epinephrine a transverse incision was made through the previous scar in the distal upper arm where the fistula had been created one year earlier. Fistula had an excellent pulse and palpable thrill and there was a very weak radial pulse distally. Venous anastomosis to the brachial artery was dissected free and the vein exposed about 3-4 cm. Proximal and distal control of the brachial artery was obtained. Artery was clamped proximally and distally after ligating the fistula it was then transected leaving a 3-4 mm cuff of vein to repair the artery. This was oversewn with 6-0 Prolene as a vein patch. When this was completed and clamps released there was an excellent pulse the brachial artery and a palpable radial pulse and excellent Doppler flow distally. No hepatomegaly was given. Wound was irrigate with saline closed in layers with Vicryl in a subcuticular fashion with Dermabond following this upper chest and neck were exposed both internal jugular veins were imaged using mode ultrasound with the SonoSite. Both were widely patent after prepping and draping in routine sterile manner the right IJ was entered using a supraclavicular approach guidewire passed into the right atrium  under fluoroscopic guidance. After dilating the track appropriately and 19 cm hemodialysis catheter was passed through the peel-away sheath positioned in the right atrium tunneled peripherally and secured with nylon sutures. The wound was closed with Vicryl in a subcuticular fashion sterile dressing applied patient taken to the recovery room in stable condition for chest x-ray   Tinnie Gens, MD 05/22/2012 9:05 AM

## 2012-05-22 NOTE — Progress Notes (Signed)
2 plus radial  Pulse on left hand

## 2012-05-22 NOTE — Anesthesia Postprocedure Evaluation (Signed)
  Anesthesia Post-op Note  Patient: Paul [name]  Procedure(s) Performed: Procedure(s) (LRB) with comments: LIGATION OF ARTERIOVENOUS  FISTULA (Left) - Left brachio-cephalic fistula INSERTION OF DIALYSIS CATHETER (N/A) - right internal jugular vein  Patient Location: PACU  Anesthesia Type:MAC  Level of Consciousness: awake  Airway and Oxygen Therapy: Patient Spontanous Breathing  Post-op Pain: mild  Post-op Assessment: Post-op Vital signs reviewed  Post-op Vital Signs: stable  Complications: No apparent anesthesia complications

## 2012-05-22 NOTE — Preoperative (Signed)
Beta Blockers   Reason not to administer Beta Blockers:Not Applicable 

## 2012-05-22 NOTE — Anesthesia Procedure Notes (Signed)
Procedure Name: MAC Date/Time: 05/22/2012 7:36 AM Performed by: Julian Reil Pre-anesthesia Checklist: Patient identified, Emergency Drugs available, Suction available and Patient being monitored Patient Re-evaluated:Patient Re-evaluated prior to inductionOxygen Delivery Method: Nasal cannula Intubation Type: IV induction Placement Confirmation: positive ETCO2 and breath sounds checked- equal and bilateral

## 2012-05-22 NOTE — Transfer of Care (Signed)
Immediate Anesthesia Transfer of Care Note  Patient: Paul [name]  Procedure(s) Performed: Procedure(s) (LRB) with comments: LIGATION OF ARTERIOVENOUS  FISTULA (Left) - Left brachio-cephalic fistula INSERTION OF DIALYSIS CATHETER (N/A) - right internal jugular vein  Patient Location: PACU  Anesthesia Type:MAC  Level of Consciousness: awake, alert , oriented and patient cooperative  Airway & Oxygen Therapy: Patient Spontanous Breathing  Post-op Assessment: Report given to PACU RN, Post -op Vital signs reviewed and stable and Patient moving all extremities  Post vital signs: Reviewed and stable  Complications: No apparent anesthesia complications

## 2012-05-22 NOTE — Anesthesia Preprocedure Evaluation (Signed)
Anesthesia Evaluation  Patient identified by MRN, date of birth, ID band Patient awake    Reviewed: Allergy & Precautions, H&P , NPO status , Patient's Chart, lab work & pertinent test results  Airway Mallampati: I  Neck ROM: Full    Dental  (+) Teeth Intact   Pulmonary shortness of breath,          Cardiovascular hypertension, + Valvular Problems/Murmurs MR Rhythm:Regular Rate:Normal + Systolic murmurs    Neuro/Psych    GI/Hepatic GERD-  ,  Endo/Other  diabetes  Renal/GU Renal disease     Musculoskeletal   Abdominal   Peds  Hematology   Anesthesia Other Findings   Reproductive/Obstetrics                           Anesthesia Physical Anesthesia Plan  ASA: III  Anesthesia Plan: MAC   Post-op Pain Management:    Induction:   Airway Management Planned: Natural Airway and Simple Face Mask  Additional Equipment:   Intra-op Plan:   Post-operative Plan:   Informed Consent: I have reviewed the patients History and Physical, chart, labs and discussed the procedure including the risks, benefits and alternatives for the proposed anesthesia with the patient or authorized representative who has indicated his/her understanding and acceptance.   Dental advisory given  Plan Discussed with: CRNA and Surgeon  Anesthesia Plan Comments:         Anesthesia Quick Evaluation

## 2012-05-25 ENCOUNTER — Encounter (HOSPITAL_COMMUNITY): Payer: Self-pay | Admitting: Vascular Surgery

## 2012-05-26 ENCOUNTER — Other Ambulatory Visit (HOSPITAL_BASED_OUTPATIENT_CLINIC_OR_DEPARTMENT_OTHER): Payer: Managed Care, Other (non HMO) | Admitting: Lab

## 2012-05-26 ENCOUNTER — Ambulatory Visit: Payer: Managed Care, Other (non HMO)

## 2012-05-26 DIAGNOSIS — N186 End stage renal disease: Secondary | ICD-10-CM

## 2012-05-26 DIAGNOSIS — D649 Anemia, unspecified: Secondary | ICD-10-CM

## 2012-05-26 LAB — CBC WITH DIFFERENTIAL/PLATELET
BASO%: 0.3 % (ref 0.0–2.0)
EOS%: 3.8 % (ref 0.0–7.0)
HCT: 36.7 % — ABNORMAL LOW (ref 38.4–49.9)
MCH: 30.8 pg (ref 27.2–33.4)
MCHC: 33.8 g/dL (ref 32.0–36.0)
NEUT%: 65.7 % (ref 39.0–75.0)
RBC: 4.03 10*6/uL — ABNORMAL LOW (ref 4.20–5.82)
RDW: 13 % (ref 11.0–14.6)
lymph#: 1.9 10*3/uL (ref 0.9–3.3)
nRBC: 0 % (ref 0–0)

## 2012-05-26 NOTE — Progress Notes (Signed)
Hgb 12.4 today, no injection needed, per protocol.  Pt verbalizes understanding and is aware of next appt.

## 2012-05-29 ENCOUNTER — Other Ambulatory Visit: Payer: Self-pay | Admitting: *Deleted

## 2012-05-29 DIAGNOSIS — N186 End stage renal disease: Secondary | ICD-10-CM

## 2012-05-29 DIAGNOSIS — Z01810 Encounter for preprocedural cardiovascular examination: Secondary | ICD-10-CM

## 2012-06-05 ENCOUNTER — Telehealth: Payer: Self-pay | Admitting: Oncology

## 2012-06-05 NOTE — Telephone Encounter (Signed)
[facility] called and cancelled appt for lab and injection for 06/08/12, did not r/s, nurse notified

## 2012-06-08 ENCOUNTER — Encounter: Payer: Self-pay | Admitting: Vascular Surgery

## 2012-06-09 ENCOUNTER — Ambulatory Visit (INDEPENDENT_AMBULATORY_CARE_PROVIDER_SITE_OTHER): Payer: Managed Care, Other (non HMO) | Admitting: Vascular Surgery

## 2012-06-09 ENCOUNTER — Other Ambulatory Visit: Payer: Self-pay | Admitting: *Deleted

## 2012-06-09 ENCOUNTER — Encounter: Payer: Self-pay | Admitting: *Deleted

## 2012-06-09 ENCOUNTER — Encounter: Payer: Self-pay | Admitting: Vascular Surgery

## 2012-06-09 ENCOUNTER — Ambulatory Visit: Payer: Managed Care, Other (non HMO)

## 2012-06-09 ENCOUNTER — Other Ambulatory Visit: Payer: Managed Care, Other (non HMO) | Admitting: Lab

## 2012-06-09 VITALS — BP 144/78 | HR 65 | Ht 73.0 in | Wt 181.0 lb

## 2012-06-09 DIAGNOSIS — Z01810 Encounter for preprocedural cardiovascular examination: Secondary | ICD-10-CM

## 2012-06-09 DIAGNOSIS — N186 End stage renal disease: Secondary | ICD-10-CM

## 2012-06-09 NOTE — Progress Notes (Signed)
Subjective:     Patient ID: Paul [name], male   DOB: November 15, 1955, 57 y.o.   MRN: [id number]  HPI this 57 year old male patient with end-stage renal disease recently had ligation of the left brachial vein cephalic AV fistula because of a severe steal syndrome which had caused chronic numbness and weakness in his left hand with some hypo-thenar eminence atrophy. He states that since the ligation of the fistula he has better sensation and slightly better strength in the hand but it is definitely not normal. He is currently training for home hemodialysis. He has a hemodialysis catheter in place in the right IJ. He is evaluated today for possible access in the contralateral right upper extremity. He is right-handed. He complains of no pain or numbness in the right hand.   Review of Systems     Objective:   Physical ExamBP 144/78  Pulse 65  Ht 6\' 1"  (1.854 m)  Wt 181 lb (82.101 kg)  BMI 23.88 kg/m2  SpO2 98%  General well-developed well-nourished male in no apparent stress alert and oriented x3 Left upper extremity with 3+ brachial and radial pulse palpable. Strength in left hand seems stronger than previously. Does have slight decreased sensation but strong grip. Left hand well-perfused. Right upper extremity appears to have adequate forearm cephalic vein with 3+ radial pulse palpable and well-perfused right hand  Today I ordered a right upper extremity vein mapping and it does reveal a good caliber in the cephalic vein from the wrist to the shoulder level.    Assessment:     End-stage renal disease with history of severe steal left brachial cephalic AV fistula Will plan right radial cephalic AV fistula on Thursday, January 30    Plan:     Right radiocephalic AV fistula Thursday, January 30-discussed at length with him and wife possibility of steal or failure of a right arm fistula to mature and they understand this but would like to proceed

## 2012-06-09 NOTE — Progress Notes (Signed)
Right upper extremity vein mapping performed @ [facility] 06/09/2012

## 2012-06-10 ENCOUNTER — Telehealth: Payer: Self-pay | Admitting: Oncology

## 2012-06-10 NOTE — Telephone Encounter (Signed)
Pt called and cancelled lab and injections on 06/23/12 , nurse notified

## 2012-06-11 ENCOUNTER — Encounter (HOSPITAL_COMMUNITY): Payer: Self-pay | Admitting: Pharmacy Technician

## 2012-06-17 ENCOUNTER — Encounter (HOSPITAL_COMMUNITY): Payer: Self-pay | Admitting: *Deleted

## 2012-06-17 MED ORDER — DEXTROSE 5 % IV SOLN
1.5000 g | INTRAVENOUS | Status: AC
  Administered 2012-06-18: 1.5 g via INTRAVENOUS
  Filled 2012-06-17 (×2): qty 1.5

## 2012-06-17 NOTE — Progress Notes (Signed)
Pt said he had a 2D Echo and Stress test at [facility] cardiology.  I requested results.

## 2012-06-18 ENCOUNTER — Encounter (HOSPITAL_COMMUNITY): Payer: Self-pay | Admitting: *Deleted

## 2012-06-18 ENCOUNTER — Encounter (HOSPITAL_COMMUNITY): Payer: Self-pay | Admitting: Anesthesiology

## 2012-06-18 ENCOUNTER — Ambulatory Visit (HOSPITAL_COMMUNITY): Payer: Managed Care, Other (non HMO) | Admitting: Anesthesiology

## 2012-06-18 ENCOUNTER — Encounter (HOSPITAL_COMMUNITY)
Admission: RE | Disposition: A | Discharge status: Home / Self Care | Payer: Self-pay | Source: Ambulatory Visit | Attending: Vascular Surgery

## 2012-06-18 ENCOUNTER — Ambulatory Visit (HOSPITAL_COMMUNITY)
Admission: RE | Admit: 2012-06-18 | Discharge: 2012-06-18 | Disposition: A | Discharge status: Home / Self Care | Payer: Managed Care, Other (non HMO) | Source: Ambulatory Visit | Attending: Vascular Surgery | Admitting: Vascular Surgery

## 2012-06-18 ENCOUNTER — Telehealth: Payer: Self-pay | Admitting: Vascular Surgery

## 2012-06-18 ENCOUNTER — Ambulatory Visit (HOSPITAL_COMMUNITY): Payer: Managed Care, Other (non HMO)

## 2012-06-18 DIAGNOSIS — N186 End stage renal disease: Secondary | ICD-10-CM

## 2012-06-18 DIAGNOSIS — Z794 Long term (current) use of insulin: Secondary | ICD-10-CM | POA: Insufficient documentation

## 2012-06-18 DIAGNOSIS — E119 Type 2 diabetes mellitus without complications: Secondary | ICD-10-CM | POA: Insufficient documentation

## 2012-06-18 DIAGNOSIS — I120 Hypertensive chronic kidney disease with stage 5 chronic kidney disease or end stage renal disease: Secondary | ICD-10-CM | POA: Insufficient documentation

## 2012-06-18 HISTORY — DX: Personal history of other medical treatment: Z92.89

## 2012-06-18 HISTORY — PX: AV FISTULA PLACEMENT: SHX1204

## 2012-06-18 LAB — POCT I-STAT 4, (NA,K, GLUC, HGB,HCT)
Glucose, Bld: 179 mg/dL — ABNORMAL HIGH (ref 70–99)
HCT: 28 % — ABNORMAL LOW (ref 39.0–52.0)
Potassium: 2.7 mEq/L — CL (ref 3.5–5.1)
Sodium: 140 mEq/L (ref 135–145)

## 2012-06-18 LAB — GLUCOSE, CAPILLARY
Glucose-Capillary: 179 mg/dL — ABNORMAL HIGH (ref 70–99)
Glucose-Capillary: 155 mg/dL — ABNORMAL HIGH (ref 70–99)

## 2012-06-18 SURGERY — ARTERIOVENOUS (AV) FISTULA CREATION
Anesthesia: Monitor Anesthesia Care | Site: Arm Lower | Laterality: Right | Wound class: Clean

## 2012-06-18 MED ORDER — MIDAZOLAM HCL 2 MG/2ML IJ SOLN: 1.0000 mg | INTRAMUSCULAR | Status: DC | PRN

## 2012-06-18 MED ORDER — LIDOCAINE HCL (PF) 1 % IJ SOLN
INTRAMUSCULAR | Status: AC
  Filled 2012-06-18: qty 30

## 2012-06-18 MED ORDER — 0.9 % SODIUM CHLORIDE (POUR BTL) OPTIME
TOPICAL | Status: DC | PRN
  Administered 2012-06-18: 1000 mL

## 2012-06-18 MED ORDER — SODIUM CHLORIDE 0.9 % IR SOLN
Status: DC | PRN
  Administered 2012-06-18: 08:00:00

## 2012-06-18 MED ORDER — FENTANYL CITRATE 0.05 MG/ML IJ SOLN: 50.0000 ug | Freq: Once | INTRAMUSCULAR | Status: DC

## 2012-06-18 MED ORDER — OXYCODONE HCL 5 MG PO TABS: 5.0000 mg | ORAL_TABLET | ORAL | Status: DC | PRN

## 2012-06-18 MED ORDER — OXYCODONE HCL 5 MG PO TABS: 5.0000 mg | ORAL_TABLET | Freq: Once | ORAL | Status: DC | PRN

## 2012-06-18 MED ORDER — ONDANSETRON HCL 4 MG/2ML IJ SOLN
INTRAMUSCULAR | Status: DC | PRN
  Administered 2012-06-18: 4 mg via INTRAVENOUS

## 2012-06-18 MED ORDER — PROMETHAZINE HCL 25 MG/ML IJ SOLN: 6.2500 mg | INTRAMUSCULAR | Status: DC | PRN

## 2012-06-18 MED ORDER — LIDOCAINE-EPINEPHRINE (PF) 1 %-1:200000 IJ SOLN
INTRAMUSCULAR | Status: DC | PRN
  Administered 2012-06-18: 15 mL

## 2012-06-18 MED ORDER — LIDOCAINE HCL (CARDIAC) 20 MG/ML IV SOLN
INTRAVENOUS | Status: DC | PRN
  Administered 2012-06-18: 30 mg via INTRAVENOUS

## 2012-06-18 MED ORDER — SODIUM CHLORIDE 0.9 % IV SOLN: INTRAVENOUS | Status: DC

## 2012-06-18 MED ORDER — OXYCODONE HCL 5 MG/5ML PO SOLN: 5.0000 mg | Freq: Once | ORAL | Status: DC | PRN

## 2012-06-18 MED ORDER — FENTANYL CITRATE 0.05 MG/ML IJ SOLN
INTRAMUSCULAR | Status: DC | PRN
  Administered 2012-06-18 (×4): 50 ug via INTRAVENOUS

## 2012-06-18 MED ORDER — PROPOFOL INFUSION 10 MG/ML OPTIME
INTRAVENOUS | Status: DC | PRN
  Administered 2012-06-18: 50 ug/kg/min via INTRAVENOUS

## 2012-06-18 MED ORDER — FENTANYL CITRATE 0.05 MG/ML IJ SOLN: 25.0000 ug | INTRAMUSCULAR | Status: DC | PRN

## 2012-06-18 MED ORDER — MIDAZOLAM HCL 5 MG/5ML IJ SOLN
INTRAMUSCULAR | Status: DC | PRN
  Administered 2012-06-18 (×2): 1 mg via INTRAVENOUS

## 2012-06-18 MED ORDER — SODIUM CHLORIDE 0.9 % IV SOLN
INTRAVENOUS | Status: DC | PRN
  Administered 2012-06-18: 07:00:00 via INTRAVENOUS

## 2012-06-18 MED ORDER — LIDOCAINE-EPINEPHRINE (PF) 1 %-1:200000 IJ SOLN
INTRAMUSCULAR | Status: AC
  Filled 2012-06-18: qty 10

## 2012-06-18 SURGICAL SUPPLY — 37 items
ADH SKN CLS APL DERMABOND .7 (GAUZE/BANDAGES/DRESSINGS) ×1
CANISTER SUCTION 2500CC (MISCELLANEOUS) ×2 IMPLANT
CLIP TI MEDIUM 6 (CLIP) ×2 IMPLANT
CLIP TI WIDE RED SMALL 6 (CLIP) ×2 IMPLANT
CLOTH BEACON ORANGE TIMEOUT ST (SAFETY) ×2 IMPLANT
COVER PROBE W GEL 5X96 (DRAPES) IMPLANT
COVER SURGICAL LIGHT HANDLE (MISCELLANEOUS) ×2 IMPLANT
DERMABOND ADVANCED (GAUZE/BANDAGES/DRESSINGS) ×1
DERMABOND ADVANCED .7 DNX12 (GAUZE/BANDAGES/DRESSINGS) ×1 IMPLANT
DRAIN PENROSE 1/4X12 LTX STRL (WOUND CARE) ×1 IMPLANT
ELECT REM PT RETURN 9FT ADLT (ELECTROSURGICAL) ×2
ELECTRODE REM PT RTRN 9FT ADLT (ELECTROSURGICAL) ×1 IMPLANT
GEL ULTRASOUND 20GR AQUASONIC (MISCELLANEOUS) ×1 IMPLANT
GLOVE BIOGEL M 6.5 STRL (GLOVE) ×1 IMPLANT
GLOVE BIOGEL PI IND STRL 6.5 (GLOVE) ×2 IMPLANT
GLOVE BIOGEL PI IND STRL 7.0 (GLOVE) IMPLANT
GLOVE BIOGEL PI IND STRL 8 (GLOVE) ×1 IMPLANT
GLOVE BIOGEL PI INDICATOR 6.5 (GLOVE) ×2
GLOVE BIOGEL PI INDICATOR 7.0 (GLOVE) ×1
GLOVE BIOGEL PI INDICATOR 8 (GLOVE) ×1
GLOVE ECLIPSE 6.5 STRL STRAW (GLOVE) ×2 IMPLANT
GLOVE SS BIOGEL STRL SZ 7 (GLOVE) ×1 IMPLANT
GLOVE SUPERSENSE BIOGEL SZ 7 (GLOVE) ×2
GOWN STRL NON-REIN LRG LVL3 (GOWN DISPOSABLE) ×4 IMPLANT
KIT BASIN OR (CUSTOM PROCEDURE TRAY) ×2 IMPLANT
KIT ROOM TURNOVER OR (KITS) ×2 IMPLANT
NS IRRIG 1000ML POUR BTL (IV SOLUTION) ×2 IMPLANT
PACK CV ACCESS (CUSTOM PROCEDURE TRAY) ×2 IMPLANT
PAD ARMBOARD 7.5X6 YLW CONV (MISCELLANEOUS) ×4 IMPLANT
SPONGE GAUZE 4X4 12PLY (GAUZE/BANDAGES/DRESSINGS) ×2 IMPLANT
SUT PROLENE 6 0 BV (SUTURE) ×2 IMPLANT
SUT VIC AB 3-0 SH 27 (SUTURE) ×2
SUT VIC AB 3-0 SH 27X BRD (SUTURE) ×1 IMPLANT
TOWEL OR 17X24 6PK STRL BLUE (TOWEL DISPOSABLE) ×2 IMPLANT
TOWEL OR 17X26 10 PK STRL BLUE (TOWEL DISPOSABLE) ×2 IMPLANT
UNDERPAD 30X30 INCONTINENT (UNDERPADS AND DIAPERS) ×2 IMPLANT
WATER STERILE IRR 1000ML POUR (IV SOLUTION) ×2 IMPLANT

## 2012-06-18 NOTE — Progress Notes (Signed)
Spoke with Santiago Glad from [facility].  Report given, no new orders

## 2012-06-18 NOTE — H&P (View-Only) (Signed)
Subjective:     Patient ID: Paul [name], male   DOB: 29-Mar-1956, 57 y.o.   MRN: [id number]  HPI this 57 year old male patient with end-stage renal disease recently had ligation of the left brachial vein cephalic AV fistula because of a severe steal syndrome which had caused chronic numbness and weakness in his left hand with some hypo-thenar eminence atrophy. He states that since the ligation of the fistula he has better sensation and slightly better strength in the hand but it is definitely not normal. He is currently training for home hemodialysis. He has a hemodialysis catheter in place in the right IJ. He is evaluated today for possible access in the contralateral right upper extremity. He is right-handed. He complains of no pain or numbness in the right hand.   Review of Systems     Objective:   Physical ExamBP 144/78  Pulse 65  Ht 6\' 1"  (1.854 m)  Wt 181 lb (82.101 kg)  BMI 23.88 kg/m2  SpO2 98%  General well-developed well-nourished male in no apparent stress alert and oriented x3 Left upper extremity with 3+ brachial and radial pulse palpable. Strength in left hand seems stronger than previously. Does have slight decreased sensation but strong grip. Left hand well-perfused. Right upper extremity appears to have adequate forearm cephalic vein with 3+ radial pulse palpable and well-perfused right hand  Today I ordered a right upper extremity vein mapping and it does reveal a good caliber in the cephalic vein from the wrist to the shoulder level.    Assessment:     End-stage renal disease with history of severe steal left brachial cephalic AV fistula Will plan right radial cephalic AV fistula on Thursday, January 30    Plan:     Right radiocephalic AV fistula Thursday, January 30-discussed at length with him and wife possibility of steal or failure of a right arm fistula to mature and they understand this but would like to proceed

## 2012-06-18 NOTE — Preoperative (Signed)
Beta Blockers   Reason not to administer Beta Blockers:Not Applicable 

## 2012-06-18 NOTE — Op Note (Signed)
OPERATIVE REPORT  Date of Surgery: 06/18/2012  Surgeon: Tinnie Gens, MD  Assistant: Johnette Abraham Pre-op Diagnosis: ESRD  Post-op Diagnosis: ESRD  Procedure: Procedure(s): ARTERIOVENOUS (AV) FISTULA CREATION-right radial cephalic Anesthesia: MAC  EBL: Minimal  Complications: None  Procedure Details: Patient was taken to the operating room placed in the supine position at which time the right upper extremity was prepped with Betadine scrub and solution draped in routine sterile manner. After infiltration with 1% Xylocaine with epinephrine a short longitudinal incision was made just proximal to the wrist midway between the radial artery and cephalic vein. Cephalic vein was dissected free it had multiple branches and the largest was preserved others ligated with 3-0 silk ties and divided. It was gently dilated with heparinized saline there was a 3 mm vein excellent quality. Radial artery was exposed beneath the fascia encircled with vessel loops. It was a 3 mm artery. It was occluded proximally and distally opened with a 15 blade extended with Potts scissors. The vein was carefully measured spatulated and anastomosed end to side with 6-0 Prolene. This ligature then released and there was an excellent pulse and thrill up to the antecubital area and excellent ulnar flow with the fistula open and compressed which did not change. Adequate hemostasis was achieved wound closed in layers with Vicryl in a subcuticular fashion sterile dressing applied patient taken to the recovery room in stable condition   Tinnie Gens, MD 06/18/2012 9:07 AM

## 2012-06-18 NOTE — Transfer of Care (Signed)
Immediate Anesthesia Transfer of Care Note  Patient: Paul [name]  Procedure(s) Performed: Procedure(s) (LRB) with comments: ARTERIOVENOUS (AV) FISTULA CREATION (Right) - CIMINO  Patient Location: PACU  Anesthesia Type:MAC  Level of Consciousness: alert  and patient cooperative  Airway & Oxygen Therapy: Patient Spontanous Breathing  Post-op Assessment: Report given to PACU RN, Post -op Vital signs reviewed and stable and Patient moving all extremities X 4  Post vital signs: Reviewed and stable  Complications: No apparent anesthesia complications

## 2012-06-18 NOTE — Anesthesia Preprocedure Evaluation (Addendum)
Anesthesia Evaluation  Patient identified by MRN, date of birth, ID band Patient awake    Reviewed: Allergy & Precautions, H&P , NPO status , Patient's Chart, lab work & pertinent test results, reviewed documented beta blocker date and time   Airway Mallampati: II TM Distance: >3 FB Neck ROM: full    Dental   Pulmonary shortness of breath and with exertion,  breath sounds clear to auscultation        Cardiovascular hypertension, On Medications Rhythm:Regular Rate:Normal     Neuro/Psych negative neurological ROS  negative psych ROS   GI/Hepatic negative GI ROS, Neg liver ROS, GERD-  ,  Endo/Other  diabetes, Insulin Dependent  Renal/GU CRFRenal disease  negative genitourinary   Musculoskeletal   Abdominal   Peds  Hematology negative hematology ROS (+)   Anesthesia Other Findings See surgeon's H&P  K 2.7  Reproductive/Obstetrics negative OB ROS                          Anesthesia Physical Anesthesia Plan  ASA: III  Anesthesia Plan: MAC   Post-op Pain Management:    Induction: Intravenous  Airway Management Planned: Simple Face Mask  Additional Equipment:   Intra-op Plan:   Post-operative Plan:   Informed Consent: I have reviewed the patients History and Physical, chart, labs and discussed the procedure including the risks, benefits and alternatives for the proposed anesthesia with the patient or authorized representative who has indicated his/her understanding and acceptance.     Plan Discussed with: CRNA and Surgeon  Anesthesia Plan Comments:         Anesthesia Quick Evaluation

## 2012-06-18 NOTE — Interval H&P Note (Signed)
History and Physical Interval Note:  06/18/2012 7:40 AM  Paul [name]  has presented today for surgery, with the diagnosis of ESRD  The various methods of treatment have been discussed with the patient and family. After consideration of risks, benefits and other options for treatment, the patient has consented to  Procedure(s) (LRB) with comments: ARTERIOVENOUS (AV) FISTULA CREATION (Right) - CIMINO as a surgical intervention .  The patient's history has been reviewed, patient examined, no change in status, stable for surgery.  I have reviewed the patient's chart and labs.  Questions were answered to the patient's satisfaction.     Tinnie Gens

## 2012-06-18 NOTE — Progress Notes (Signed)
Dr. Leda Quail notified of potassium 2.7 and he states that's okay, we will deal with it.[name]

## 2012-06-18 NOTE — Anesthesia Procedure Notes (Signed)
Procedure Name: MAC Date/Time: 06/18/2012 7:50 AM Performed by: Sherilyn Banker Pre-anesthesia Checklist: Patient identified, Emergency Drugs available, Suction available, Patient being monitored and Timeout performed Patient Re-evaluated:Patient Re-evaluated prior to inductionOxygen Delivery Method: Simple face mask Preoxygenation: Pre-oxygenation with 100% oxygen

## 2012-06-18 NOTE — Anesthesia Postprocedure Evaluation (Signed)
  Anesthesia Post-op Note  Patient: Paul [name]  Procedure(s) Performed: Procedure(s) (LRB) with comments: ARTERIOVENOUS (AV) FISTULA CREATION (Right) - CIMINO  Patient Location: PACU  Anesthesia Type:MAC  Level of Consciousness: awake  Airway and Oxygen Therapy: Patient Spontanous Breathing  Post-op Pain: mild  Post-op Assessment: Post-op Vital signs reviewed, Patient's Cardiovascular Status Stable, Respiratory Function Stable, Patent Airway, No signs of Nausea or vomiting and Pain level controlled  Post-op Vital Signs: stable  Complications: No apparent anesthesia complications

## 2012-06-18 NOTE — Telephone Encounter (Addendum)
Message copied by Doristine Section on Thu Jun 18, 2012 10:31 AM ------      Message from: Alfonso Patten      Created: Thu Jun 18, 2012  9:23 AM                   ----- Message -----         From: Richrd Prime, [place]         Sent: 06/18/2012   8:53 AM           To: Alfonso Patten, RN            2-3 week f/U AVF - [facility]      Wants this pt to be seen sooner to assess for steal  notified patient's wife of fu appt. on 06-30-12 at 10:30 am with dr. Kellie Simmering as per staff message 06-18-12

## 2012-06-19 ENCOUNTER — Encounter (HOSPITAL_COMMUNITY): Payer: Self-pay | Admitting: Vascular Surgery

## 2012-06-23 ENCOUNTER — Ambulatory Visit: Payer: Managed Care, Other (non HMO)

## 2012-06-23 ENCOUNTER — Other Ambulatory Visit: Payer: Managed Care, Other (non HMO) | Admitting: Lab

## 2012-06-29 ENCOUNTER — Encounter: Payer: Self-pay | Admitting: Vascular Surgery

## 2012-06-29 ENCOUNTER — Ambulatory Visit: Payer: Managed Care, Other (non HMO) | Attending: Vascular Surgery | Admitting: Occupational Therapy

## 2012-06-29 ENCOUNTER — Other Ambulatory Visit: Payer: Self-pay

## 2012-06-29 DIAGNOSIS — N186 End stage renal disease: Secondary | ICD-10-CM

## 2012-06-29 DIAGNOSIS — T82898A Other specified complication of vascular prosthetic devices, implants and grafts, initial encounter: Secondary | ICD-10-CM

## 2012-06-29 DIAGNOSIS — M6281 Muscle weakness (generalized): Secondary | ICD-10-CM | POA: Insufficient documentation

## 2012-06-29 DIAGNOSIS — Z5189 Encounter for other specified aftercare: Secondary | ICD-10-CM | POA: Insufficient documentation

## 2012-06-29 DIAGNOSIS — R279 Unspecified lack of coordination: Secondary | ICD-10-CM | POA: Insufficient documentation

## 2012-06-30 ENCOUNTER — Encounter: Payer: Self-pay | Admitting: Vascular Surgery

## 2012-06-30 ENCOUNTER — Ambulatory Visit (INDEPENDENT_AMBULATORY_CARE_PROVIDER_SITE_OTHER): Payer: Managed Care, Other (non HMO) | Admitting: Vascular Surgery

## 2012-06-30 ENCOUNTER — Ambulatory Visit: Payer: Managed Care, Other (non HMO) | Admitting: Vascular Surgery

## 2012-06-30 ENCOUNTER — Encounter (INDEPENDENT_AMBULATORY_CARE_PROVIDER_SITE_OTHER): Payer: Managed Care, Other (non HMO) | Admitting: *Deleted

## 2012-06-30 VITALS — BP 164/85 | HR 70 | Resp 20 | Ht 73.0 in | Wt 183.0 lb

## 2012-06-30 DIAGNOSIS — T82898A Other specified complication of vascular prosthetic devices, implants and grafts, initial encounter: Secondary | ICD-10-CM

## 2012-06-30 DIAGNOSIS — N186 End stage renal disease: Secondary | ICD-10-CM

## 2012-06-30 NOTE — Progress Notes (Signed)
Subjective:     Patient ID: Paul [name], male   DOB: 1955/09/06, 57 y.o.   MRN: [id number]  HPI this 57 year old male with end-stage renal disease currently on home hemodialysis through a right IJ catheter returns two-week post creation right radial cephalic AV fistula he previously had a left brachial cephalic AV fistula ligated because of severe steal syndrome. He has had no pain or numbness in the right hand since the fistula was created 2 weeks ago. He has good strength.he states that the strength and sensation in the left hand has been slowly improving.   Review of Systems     Objective:   Physical Exam BP 164/85  Pulse 70  Resp 20  Ht 6\' 1"  (1.854 m)  Wt 183 lb (83.008 kg)  BMI 24.15 kg/m2  General well-developed well-nourished male in no apparent stress alert and oriented x3 Right upper extremity with 3+ brachial and 2+ radial pulse palpable with excellent pulse and palpable thrill in the brachial cephalic fistula up to the antecubital area. Right hand slightly pale but with good strength and normal sensation. Left upper extremity with 3+ brachial radial pulse palpable well-perfused left hand.  Today I ordered upper extremity arterial study which revealed triphasic flow to the left hand with normal blood pressure. Right upper extremity reveals digital pressure 55% of normal compared to 97% with compression of the fistula.     Assessment:     Recent AV fistula-right radial cephalic with mild steal by hemodynamic studies but asymptomatic History of left brachial cephalic AV fistula with severe steal requiring ligation of fistula Patient on home hemodialysis     Plan:     Excellent function right radial cephalic AV fistula which should be ready to utilize in early May 2014-no evidence of symptomatic steal at the present time Patient will monitor potential pain or numbness in the right hand and if this occurs he will be in touch with [place] for further evaluation otherwise okay to  begin hemodialysis through fistula in early May He will continue rehabbing left upper extremity

## 2012-07-07 ENCOUNTER — Ambulatory Visit (HOSPITAL_BASED_OUTPATIENT_CLINIC_OR_DEPARTMENT_OTHER): Payer: Managed Care, Other (non HMO) | Admitting: Oncology

## 2012-07-07 ENCOUNTER — Ambulatory Visit: Payer: Managed Care, Other (non HMO)

## 2012-07-07 ENCOUNTER — Other Ambulatory Visit (HOSPITAL_BASED_OUTPATIENT_CLINIC_OR_DEPARTMENT_OTHER): Payer: Managed Care, Other (non HMO) | Admitting: Lab

## 2012-07-07 VITALS — BP 170/83 | HR 68 | Temp 97.4°F | Resp 20 | Ht 73.0 in | Wt 187.5 lb

## 2012-07-07 DIAGNOSIS — D649 Anemia, unspecified: Secondary | ICD-10-CM

## 2012-07-07 DIAGNOSIS — R894 Abnormal immunological findings in specimens from other organs, systems and tissues: Secondary | ICD-10-CM

## 2012-07-07 DIAGNOSIS — N186 End stage renal disease: Secondary | ICD-10-CM

## 2012-07-07 DIAGNOSIS — N289 Disorder of kidney and ureter, unspecified: Secondary | ICD-10-CM

## 2012-07-07 DIAGNOSIS — D696 Thrombocytopenia, unspecified: Secondary | ICD-10-CM

## 2012-07-07 LAB — CBC WITH DIFFERENTIAL/PLATELET
Basophils Absolute: 0 10*3/uL (ref 0.0–0.1)
EOS%: 0.7 % (ref 0.0–7.0)
Eosinophils Absolute: 0 10*3/uL (ref 0.0–0.5)
HGB: 11.3 g/dL — ABNORMAL LOW (ref 13.0–17.1)
MCH: 31.6 pg (ref 27.2–33.4)
MCV: 96.9 fL (ref 79.3–98.0)
MONO%: 8.6 % (ref 0.0–14.0)
NEUT#: 3.8 10*3/uL (ref 1.5–6.5)
RBC: 3.58 10*6/uL — ABNORMAL LOW (ref 4.20–5.82)
RDW: 13.3 % (ref 11.0–14.6)
lymph#: 1.2 10*3/uL (ref 0.9–3.3)
nRBC: 0 % (ref 0–0)

## 2012-07-07 MED ORDER — EPOETIN ALFA 20000 UNIT/ML IJ SOLN: 40000.0000 [IU] | Freq: Once | INTRAMUSCULAR | Status: DC

## 2012-07-07 NOTE — Progress Notes (Signed)
   [facility]    OFFICE PROGRESS NOTE   INTERVAL HISTORY:   She returns as scheduled. He is now on home hemodialysis [facility] right chest catheter. He is waiting on a right arm fistula to mature. He last received erythropoietin here on 04/28/2012. Mr. Beevers reports receiving erythropoietin via the dialysis procedure 3 days per week.  He continues close followup with Dr. Lorrene Reid. His sons are undergoing evaluation to be kidney donors.  Objective:  Vital signs in last 24 hours:  Blood pressure 170/83, pulse 68, temperature 97.4 F (36.3 C), temperature source Oral, resp. rate 20, height 6\' 1"  (1.854 m), weight 187 lb 8 oz (85.049 kg).    HEENT: Neck without Lymphatics: No cervical, supraclavicular, or axillary nodes  Resp: Lungs clear bilaterally Cardio: Regular rate and rhythm GI: No hepatosplenomegaly, nontender, no mass Vascular: No leg edema  Right chest dialysis catheter with a gauze dressing  Lab Results:  Lab Results  Component Value Date   WBC 5.5 07/07/2012   HGB 11.3* 07/07/2012   HCT 34.7* 07/07/2012   MCV 96.9 07/07/2012   PLT 95* 07/07/2012   ANC 3.8    Medications: I have reviewed the patient's current medications.  Assessment/Plan: 1. Normocytic anemia, status post a nondiagnostic bone marrow biopsy, 10/10/2010. The anemia response to erythropoietin therapy.  2. Renal insufficiency, status post a biopsy, 11/09/2010, confirming glomerulosclerosis and arteriosclerosis. He continues followup with Dr. Lorrene Reid. The renal failure has progressed and he underwent placement of an AV fistula . He is now undergoing home hemodialysis via a catheter . He is awaiting a renal transplant. 3. Diabetes. 4. Hypertension. 5. Elevated free kappa and lambda light chains. 6. Mild thrombocytopenia status post a nondiagnostic bone marrow biopsy 10/10/2010. He continues to have intermittent mild thrombocytopenia. 7. 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 He received erythropoietin via the dialysis procedure. Mr. Yetter plans to continue followup with Dr. Lorrene Reid for treatment of the renal failure induced anemia. He would like to be discharged from the hematology clinic.  He continues to have intermittent mild thrombocytopenia of unclear etiology. I will ask Dr. Lorrene Reid to refer him back to [place] if he develops progressive thrombocytopenia.  Mr. Buonomo is not scheduled for a followup appointment in the hematology clinic. We will be glad to see him in the future as needed.   Betsy Coder, MD  07/07/2012  2:25 PM

## 2012-07-07 NOTE — Progress Notes (Signed)
Now on home dialysis 6 days/week via dialysis cath right chest. Performs in evening taking about 4 hours to complete.

## 2012-07-09 ENCOUNTER — Ambulatory Visit: Payer: Managed Care, Other (non HMO) | Admitting: Occupational Therapy

## 2012-07-16 ENCOUNTER — Ambulatory Visit: Payer: Managed Care, Other (non HMO) | Admitting: Occupational Therapy

## 2012-07-22 ENCOUNTER — Ambulatory Visit: Payer: Managed Care, Other (non HMO) | Attending: Vascular Surgery | Admitting: Occupational Therapy

## 2012-07-22 DIAGNOSIS — R279 Unspecified lack of coordination: Secondary | ICD-10-CM | POA: Insufficient documentation

## 2012-07-22 DIAGNOSIS — Z5189 Encounter for other specified aftercare: Secondary | ICD-10-CM | POA: Insufficient documentation

## 2012-07-22 DIAGNOSIS — M6281 Muscle weakness (generalized): Secondary | ICD-10-CM | POA: Insufficient documentation

## 2012-07-28 ENCOUNTER — Encounter: Payer: Managed Care, Other (non HMO) | Admitting: Occupational Therapy

## 2012-08-04 ENCOUNTER — Encounter: Payer: Self-pay | Admitting: Vascular Surgery

## 2012-08-27 ENCOUNTER — Encounter: Payer: Self-pay | Admitting: Oncology

## 2012-09-22 ENCOUNTER — Encounter: Payer: Self-pay | Admitting: Vascular Surgery

## 2012-09-23 ENCOUNTER — Encounter: Payer: Self-pay | Admitting: Vascular Surgery

## 2012-09-23 ENCOUNTER — Ambulatory Visit (INDEPENDENT_AMBULATORY_CARE_PROVIDER_SITE_OTHER): Payer: Managed Care, Other (non HMO) | Admitting: Vascular Surgery

## 2012-09-23 ENCOUNTER — Other Ambulatory Visit: Payer: Self-pay | Admitting: *Deleted

## 2012-09-23 ENCOUNTER — Encounter (INDEPENDENT_AMBULATORY_CARE_PROVIDER_SITE_OTHER): Payer: Managed Care, Other (non HMO) | Admitting: *Deleted

## 2012-09-23 VITALS — BP 121/68 | HR 75 | Resp 18 | Ht 73.0 in | Wt 181.0 lb

## 2012-09-23 DIAGNOSIS — N186 End stage renal disease: Secondary | ICD-10-CM

## 2012-09-23 DIAGNOSIS — Z4931 Encounter for adequacy testing for hemodialysis: Secondary | ICD-10-CM

## 2012-09-23 NOTE — Progress Notes (Signed)
[facility]  Postoperative Access Visit  History of Present Illness  Paul [name] is a 57 y.o. year old male who presents for postoperative follow-up for: L RC AVF (Date: 06/18/12).  The patient's wounds are healed.  The patient notes no steal symptoms.  The patient is able to complete their activities of daily living.  The patient's current symptoms are: none.  Physical Examination  Filed Vitals:   09/23/12 1444  BP: 121/68  Pulse: 75  Resp: 18   RUE: Incision is healed, skin feels warm, hand grip is 5/5, sensation in digits is  intact, easily palpable thrill, bruit can be easily auscultated   R arm access duplex (Date: 09/23/12)  Retrograde flow in rt radial artery distal to anastomosis  Increased velocity at anastomosis (899 c/s) due to either angle of take off or caliber change (0.44 to 0.21 cm at anastomosis)  Medical Decision Making  Paul [name] is a 57 y.o. year old male who presents s/p R RC AVF with no sx of steal.  The patient's access is ready for use.  It remains to be seen if the small degree of steal in the R RC AVF will worsen on the actual dialysis run.  The distal vein is tapered, which is optimal in this patient with history of clinical significant steal in the L arm, resulting in [facility] AVF ligation.  The patient's tunneled dialysis catheter can be removed after two successful cannulations and completed dialysis treatments.  Thank you for allowing [place] to participate in this patient's care.  Adele Barthel, MD [facility] Office: [phone] Pager: [phone]

## 2012-12-28 ENCOUNTER — Ambulatory Visit (INDEPENDENT_AMBULATORY_CARE_PROVIDER_SITE_OTHER): Payer: Managed Care, Other (non HMO) | Admitting: Surgery

## 2012-12-28 ENCOUNTER — Encounter (INDEPENDENT_AMBULATORY_CARE_PROVIDER_SITE_OTHER): Payer: Self-pay | Admitting: Surgery

## 2012-12-28 VITALS — BP 138/80 | HR 62 | Resp 14 | Ht 73.0 in | Wt 181.6 lb

## 2012-12-28 DIAGNOSIS — N186 End stage renal disease: Secondary | ICD-10-CM

## 2012-12-28 DIAGNOSIS — E119 Type 2 diabetes mellitus without complications: Secondary | ICD-10-CM | POA: Insufficient documentation

## 2012-12-28 MED ORDER — GENTAMICIN SULFATE 40 MG/ML IJ SOLN: 100.0000 mg | INTRAMUSCULAR | Status: DC

## 2012-12-28 NOTE — Patient Instructions (Addendum)
See the Handout(s) we gave you.  Consider surgery.  Please call our office at [phone] if you wish to schedule surgery or if you have further questions / concerns.   Peritoneal Dialysis Dialysis can be done using a machine outside of the body (hemodialysis). Or, it can be done inside the body (peritoneal dialysis). The word "peritoneal" refers to the lining or membrane of the belly (abdominal cavity). The peritoneal membrane is a thin, plastic-like lining inside the belly that covers the organs and fits in the abdominal or peritoneal cavity, such as the stomach, liver and the kidneys. This lining works like a filter. It will allow certain things to pass from your blood through the lining and into a special solution that has been placed into your belly. In this type of dialysis, the peritoneum is used to help clean the blood.  If you need dialysis, your kidneys are not working right. Healthy kidneys take out extra water and waste products, which becomes urine. When the kidneys do not do this, serious problems can develop. The waste and water build up in the blood. Your hands and feet might swell. You may feel tired, weak or sick to your stomach. Also, your blood pressure may rise. If not treated, you could die. Dialysis is a treatment that does the work that your kidneys would do if they were healthy.  It cleans your blood.  It will make sure your body has the right amount of certain chemicals that it needs. They include potassium, sodium and bicarbonate.  It will help control your blood pressure. UNDERSTANDING PERITONEAL DIALYSIS  Here is how peritoneal dialysis works:  First, you will have surgery to put a soft plastic tube (catheter) into your belly (abdomen). This will allow you to easily connect yourself to special tubing, which will then let a special dialysis solution to be placed into your abdomen.  For each treatment, you will need at least one bag of dialysis solution (a liquid  called dialysate). It is a mix of water that is pure and free of germs (sterile), sugar (dextrose) and the nutrients and minerals found in your blood. Sometimes, more than one bag is needed to get the right amount of fluid for your abdomen. Your caregiver will explain what size and how many bags you will need.  The dialysate is slowly put through the catheter to fill the abdomen (called the peritoneal cavity). This dialysate will need to stay in your body for 3-4 hours. This is known as the dwell time.  The solution is working to clean the blood and remove wastes from your body. At the end of this time, the solution is drained from your body through tubing into an empty bag. It is then replaced with a fresh dialysate.  The draining and replacing of the dialysate is called an exchange or cycle. The catheter is capped after each exchange. Once the solution is in your body, you are then free to do whatever you would like until the next exchange. Most people will need to do 4-5 exchanges each day.  There are two different methods that can be used.  Continuous ambulatory peritoneal dialysis (CAPD): You put the solution into your abdomen, cap your catheter and then go about your day. Several hours later, you reconnect to a tubing set up, drain out the solution and then put more solution in. This is done several times a day. No machine is needed.  Continuous cycler-assisted peritoneal dialysis (CCPD): A machine is used,  which fills the abdomen with dialysate and then drains it. This happens several times. It usually is done at night while you are sleeping. When you wake up, you can disconnect from the machine and are free to go to go about your day. PREPARING FOR EXCHANGES  Discuss the details of the procedure with your caregivers. You will be working with a nurse who is specially trained in doing dialysis. Make sure you understand:  How to do an exchange.  How much solution you need.  What type of  solution you will need.  How often you should do an exchange. Ask:  How many times each day?  When? At meals? At bedtime?  Always keep the dialysate bags and other supplies in a cool, clean and dry place.  Keeping everything clean is very important.  The catheter and its cap must be free from germs (sterile)  The adapter also must be sterile. It attaches the dialysis bag and tubing to the catheter.  Clean the area of your body around the catheter every day. Use a chemical that fights infection (antiseptic).  Wash your hands thoroughly before starting an exchange.  You may be taught to wear a mask to cover your nose and mouth. This makes infection less likely to happen.  You may be taught to close doors, windows and turn off any fans before doing an exchange.  Check the dialysate bag very carefully.  Make sure it is the right size bag for you. This information is on the label.  Also, make sure it is the right mixture. For some people, the dialysate contents vary. For instance, the mixture might be a stronger solution for overnight.  Check the expiration date (the last date you can use the bag). It also is on the label. If the date has gone by, throw away the bag.  The solution should be clear. You should be able to see any writing on the side of the bag clearly through the solution. Do not use a cloudy solution.  Gently squeeze the bag to make sure there are no leaks.  Use a dry heating pad to warm the dialysate in the bag. Leave the cover on the bag while you do this.  This is for comfort. You can skip this step if you want.  Never place the bag of solution under warm or hot water. Water from a faucet is not sterile and could cause germs to get into the bag. Infection could then result. PERFORMING AN EXCHANGE  For continuous ambulatory dialysis:  Attach the dialysis bag and tubing to your catheter. Hang the bag so that gravity (the natural downward pull) draws the  solution down and into your abdomen once the clamps are opened. This should take about 10 minutes.  Remove the bag and tubing from the catheter. Cap the catheter.  The solution stays in the abdomen for 3-4 hours (dwell time). The solution is working to clean the blood and remove wastes from your body.  When you are ready to drain the solution for another exchange, take the cap off the catheter. Then, attach the catheter to tubing, which is attached to an empty bag. Place this empty bag below the abdomen or on the floor or stool and undo the clamps.  Gravity helps pull the fluid out of the abdomen and into the bag. The fluid in the bag may look yellow and clear, like urine. It usually takes about 20 minutes to drain the fluid out of the  abdomen.  When the solution has drained, start the process again by infusing a new bag of dialysate and then capping the catheter.  This should continue until you have used all of the solution that you are to use each day.  Sometimes, a small machine is used overnight. It is called a mini-cycler. This is done if the body cannot go all night without an exchange. The machine lets you sleep without having to get up and do an exchange.  For continuous cycler-assisted dialysis:  You will be taught how to set up or program your machine.  When you are ready for bed, put the dialysate bags onto the cycler machine. Put on exactly the number of bags that your caregiver said to use.  Connect your catheter to the machine and turn the cycler machine on.  Overnight, the cycler will do several exchanges. It often does three to five, sometimes more.  Solution that is in your abdomen in the morning will stay during the day. The machine is set to make the daytime solution stronger, if that is needed.  In the morning, you will disconnect from the machine and cap your catheter and go about your day.  Sometimes, an extra exchange is done during the day. This may be needed to  remove excess waste or fluid. IMPORTANT REMINDERS  You will need to follow a very strict schedule. Every step of the dialysis procedure must be done every day. Sometimes, several times a day. Altogether, this might take an extra 2 hours or more. However, you must stick to the routine. Do not skip a day. Do not skip a procedure.  Some people find it helpful to work with a Social worker or Education officer, museum in addition to the renal (kidney) nurse. They can help you figure out how to change your daily routine to fit in the dialysis sessions.  You may need to change your diet. Ask your caregiver for advice, or talk with a nutritionist about what you should and should not eat.  You will need to weigh yourself every day and keep track of what your weight is.  You may be taught how to check your blood pressure before every exchange. Your blood pressure reading will help determine what type of solution to use. If your blood pressure is too high, you may need a stronger solution. RISKS AND COMPLICATIONS  Possible problems vary, depending on the method you use. Your overall health also can have an effect. Problems that could develop because of dialysis include:  Infection. This is the most common problem. It could occur:  In the peritoneum. This is called peritonitis.  Around the catheter.  Weight gain. The dialysate contains a type of sugar known as dextrose. Dextrose has a lot of calories. The body takes in several hundred calories from this sugar each day.  Weakened muscles in the abdomen. This can result from all of the fluid that your body has to hold in the abdomen.  Catheter replacement. Sometimes, a new one has to be put in.  Change in dialysis method. Due to some complications, you may need to change to hemodialysis for a short time and have your dialysis done at a center.  Trouble adjusting to your new lifestyle. In some people, this leads to depression.  Sleep problems.  Dialysis-related  amyloidosis. This sometimes occurs after 5 years of dialysis. Protein builds up in the blood. This can cause painful deposits on bones, joints and tendons (which connect muscle to bone). Or,  it can cause hollow spots in bones that make them more likely to break.  Excess fluid. Your body may absorb too much of the fluid that is held in the abdomen. This can lead to heart or lung problems. SEEK MEDICAL CARE IF:   You have any problems with an exchange.  The area around the catheter becomes red or painful.  The catheter seems loose, or it feels like it is coming out.  A bag of dialysate looks cloudy. Or, the liquid is an unusual color.  Abdominal pain or discomfort.  You feel sick to your stomach (nauseous) or throw up (vomit).  You develop a fever of more than 102 F (38.9 C). SEEK IMMEDIATE MEDICAL CARE IF:  You develop a fever of more than 102 F (38.9 C). Document Released: 03/03/2009 Document Revised: 07/29/2011 Document Reviewed: 03/03/2009 [facility] Patient Information 2014 [place], [place].  Peritoneal Dialysis (CAPD) Catheter  Healthy kidneys remove excess water and waste products from the body in the form of urine. When the kidneys cannot do this, serious problems develop and a person can fall into kidney failure.   In most cases, the kidneys can heal and recover to a better place.  Sometimes the kidneys remain somewhat damaged and a kidney specialist (nephrologist) needs to help manage the disease with medications and adjustments in diet.    Sometimes the kidney failure has progressed too far.  The body's waste and fluid build up in the blood. Hands and legs swell. You start to feel more weak or nauseated. Your blood pressure may rise, to seeing you at serious risk for heart attack and stroke.  If not treated, you will eventually die. Dialysis is a treatment that replaces the work that your kidneys would do if they were healthy to help keep you alive.  Dialysis can be done using  a machine outside of the body (hemodialysis) connected to your blood; or, it can be done with a tube placed to enter your abdomen (peritoneal dialysis). The peritoneum is the inner lining of your abdominal cavity, covering the inner organs & abdominal wall.  This inner lining of peritoneum works like a filter.   Peritoneal dialysis involves placed several quarts of sterile fluid into the inner abdomen/peritoneal cavity.  The waste products and fluids can exchange across the peritoneal membrane.  The fluid is later removed.  This most poor every day.  Sometimes the catheter can be attached to a machine that runs at night while you sleep (continuous cycler-assisted dialysis = CCPD).  Sometimes the fluid can be infused in her abdomen and then later removed hours later (continuous ambulatory peritoneal dialysis = CAPD).  If your nephrologist feels like you are a good candidate, you will be sent to be evaluated by a surgeon to consider placement of a peritoneal dialysis catheter.  Your nephrologist should have shown you educational videos .  You should discuss with dialysis nurses about what life with a peritoneal dialysis catheter is like.  Most patients who have not had many abdominal operations are reasonable candidates for placement of a peritoneal dialysis catheter   It is important to understand the risks and benefits of the procedure prior to undergoing surgery.    If your surgeon feels you are a reasonable candidate, you will have a peritoneal dialysis catheter placed.    This requires an operation under general anesthesia.  The surgeon places a soft plastic tube ([place] curl catheter) into your abdomen.  The deeper curled tip rests down in the  pelvis.  The middle part is tunneled inside your abdominal wall where cuffs provide a water-tight seal.  This leaves an outer foot of plastic tubing resting outside your body, usually in your lower abdomen below the beltline near your waist.  This is a quick  procedure that can often be done as an outpatient with the possibility of staying overnight.  It takes several weeks for the catheter to heal into a watertight seal.  Initially, your nephrologist we will have the dialysis nurses do gentle flushes through the catheter.  Once the catheter is well sealed in, you will begin larger exchanges of fluid to do full peritoneal dialysis.  Your nephrologist and dialysis nurses will help guide you through this.  While placement of the catheter is usually not technically difficult, there are risks to the procedure.  The biggest risk is infection.  This is why we place the catheter in the operating room with the use of intravenous antibiotics.  Using sterile technique to hook up the catheter to your dialysis fluids is essential.  Most infections of the catheter are mild and can be treated with antibiotics placed in the vein were placed with the peritoneal fluid.  However, sometimes the infection worsens or persists and the catheter needs to be removed.  Occasionally, the catheters can be blocked due to inner organs plugging up the tubing or kinks.  Sometimes the catheter leaks and needs to be replaced.  Sometimes the inner abdomen has too many adhesions from prior surgeries or infections and there is not enough space for fluid exchanges to occur.  Sometimes, peritoneal dialysis is not enough to compensate for the kidney failure.   In rare instances, the inner lining of the abdomen becomes severely thickened or inflamed and peritoneal dialysis can no longer work.  Sometimes it is not possible to safely replace a new catheter and the patient must go on hemodialysis permanently.  It is very common to need hemodialysis intermittently even if you have a peritoneal dialysis catheter in cases of emergencies or if peritoneal dialysis fails.  Your nephrologist and surgeon can help you decide what the best form of dialysis is for you  PERITONEAL DIALYSIS (CAPD) CATHETER  PLACEMENT:  POST OPERATIVE INSTRUCTIONS  1. DIET: Follow a light bland diet the first 24 hours after arrival home, such as soup, liquids, crackers, etc.  Be sure to include lots of fluids daily.  Avoid fast food or heavy meals as your are more likely to get nauseated.   2. Take your usually prescribed home medications unless otherwise directed. 3. PAIN CONTROL: a. Pain is best controlled by a usual combination of three different methods TOGETHER: i. Ice/Heat ii. Tylenol (over the counter pain medication) iii. Prescription pain medication b. Most patients will experience some swelling and bruising around the incisions.  Ice packs or heating pads (30-60 minutes up to 6 times a day) will help. Use ice for the first few days to help decrease swelling and bruising, then switch to heat to help relax tight/sore spots and speed recovery.  Some people prefer to use ice alone, heat alone, alternating between ice & heat.  Experiment to what works for you.  Swelling and bruising can take several weeks to resolve.   c. It is helpful to take an over-the-counter pain medication regularly for the first few weeks.  Using acetaminophen (Tylenol, etc) 500-650mg  four times a day (every meal & bedtime) is usually safest since NSAIDs are not advisable in patients with kidney disease. d.  A  prescription for pain medication (such as oxycodone, hydrocodone, etc) should be given to you upon discharge.  Take your pain medication as prescribed.  i. If you are having problems/concerns with the prescription medicine (does not control pain, nausea, vomiting, rash, itching, etc), please call us [phone] to see if we need to switch you to a different pain medicine that will work better for you and/or control your side effect better. ii. If you need a refill on your pain medication, please contact your pharmacy.  They will contact our office to request authorization. Prescriptions will not be filled after 5 pm or on  week-ends. 4. Avoid getting constipated.  Between the surgery and the pain medications, it is common to experience some constipation.  Increasing fluid intake and taking a fiber supplement (such as Metamucil, Citrucel, FiberCon, MiraLax, etc) 1-2 times a day regularly will usually help prevent this problem from occurring.  A mild laxative (prune juice, Milk of Magnesia, MiraLax, etc) should be taken according to package directions if there are no bowel movements after 48 hours.   5. Wash / shower every day.  You may shower over the dressings as they are waterproof.  Continue to shower over incision(s) after the dressing is off. 6. The Peritoneal Dialysis nurse will remove your waterproof bandages in the Dialysis Center a few days after surgery.  Do not remove the bandages until seen by them. 7. ACTIVITIES as tolerated:   a. You may resume regular (light) daily activities beginning the next day-such as daily self-care, walking, climbing stairs-gradually increasing activities as tolerated.  If you can walk 30 minutes without difficulty, it is safe to try more intense activity such as jogging, treadmill, bicycling, low-impact aerobics, swimming, etc. b. Save the most intensive and strenuous activity for last such as sit-ups, heavy lifting, contact sports, etc  Refrain from any heavy lifting or straining until you are off narcotics for pain control.   c. DO NOT PUSH THROUGH PAIN.  Let pain be your guide: If it hurts to do something, don't do it.  Pain is your body warning you to avoid that activity for another week until the pain goes down. d. You may drive when you are no longer taking prescription pain medication, you can comfortably wear a seatbelt, and you can safely maneuver your car and apply brakes. e. Dennis Bast may have sexual intercourse when it is comfortable.  FOLLOW UP with the Peritoneal Dialysis nurses closely after surgery.  Call [phone] to help arrange training/flushes of cathete    -The  CAPD nurses & Nephrology usually follow you closely, making the need for follow-up in our office redundant and therefore not needed.  If they or you have concerns, please call us for possible follow-up in our office  -Please call [facility] at [phone] only as needed.  WHEN TO CALL us [phone]: 1. Poor pain control 2. Reactions / problems with new medications (rash/itching, nausea, etc)  3. Fever over 101.5 F (38.5 C) 4. Worsening swelling or bruising 5. Continued bleeding from incision. 6. Increased pain, redness, or drainage from the incision   The clinic staff is available to answer your questions during regular business hours (8:30am-5pm).  Please don't hesitate to call and ask to speak to one of our nurses for clinical concerns.   If you have a medical emergency, go to the nearest emergency room or call 911.  A surgeon from [facility] is always on call at the hospitals  9. IF YOU HAVE DISABILITY OR FAMILY LEAVE FORMS, BRING THEM TO THE OFFICE FOR PROCESSING.  DO NOT GIVE THEM TO YOUR DOCTOR.  [facility], [place], [place], [place], [place]  [address] ? MAIN: [phone] ? TOLL FREE: [phone] ?  FAX [phone] [email or web address]  Peritoneal Dialysis - An Overview Dialysis can be done using a machine outside of the body (hemodialysis). Or, it can be done inside the body (peritoneal dialysis). The word "peritoneal" refers to the lining or membrane of the belly (abdominal cavity). The peritoneal membrane is a thin, plastic-like lining inside the belly that covers the organs and fits in the abdominal or peritoneal cavity, such as the stomach, liver and the kidneys. This lining works like a filter. It will allow certain things to pass from your blood through the lining and into a special solution that has been placed into your belly. In this type of dialysis, the peritoneum is used to help clean the blood.  If you need  dialysis, your kidneys are not working right. Healthy kidneys take out extra water and waste products, which becomes urine. When the kidneys do not do this, serious problems can develop. The waste and water build up in the blood. Your hands and feet might swell. You may feel tired, weak or sick to your stomach. Also, your blood pressure may rise. If not treated, you could die. Dialysis is a treatment that does the work that your kidneys would do if they were healthy.  It cleans your blood.   It will make sure your body has the right amount of certain chemicals that it needs. They include potassium, sodium and bicarbonate.   It will help control your blood pressure.  UNDERSTANDING PERITONEAL DIALYSIS  Here is how peritoneal dialysis works:   First, you will have surgery to put a soft plastic tube (catheter) into your belly (abdomen). This will allow you to easily connect yourself to special tubing, which will then let a special dialysis solution to be placed into your abdomen.   For each treatment, you will need at least one bag of dialysis solution (a liquid called dialysate). It is a mix of water that is pure and free of germs (sterile), sugar (dextrose) and the nutrients and minerals found in your blood. Sometimes, more than one bag is needed to get the right amount of fluid for your abdomen. Your caregiver will explain what size and how many bags you will need.   The dialysate is slowly put through the catheter to fill the abdomen (called the peritoneal cavity). This dialysate will need to stay in your body for 3-4 hours. This is known as the dwell time.   The solution is working to clean the blood and remove wastes from your body. At the end of this time, the solution is drained from your body through tubing into an empty bag. It is then replaced with a fresh dialysate.   The draining and replacing of the dialysate is called an exchange or cycle. The catheter is capped after each exchange.  Once the solution is in your body, you are then free to do whatever you would like until the next exchange. Most people will need to do 4-5 exchanges each day.   There are two different methods that can be used.   Continuous ambulatory peritoneal dialysis (CAPD): You put the solution into your abdomen, cap your catheter and then go about your day. Several hours later, you reconnect to  a tubing set up, drain out the solution and then put more solution in. This is done several times a day. No machine is needed.   Continuous cycler-assisted peritoneal dialysis (CCPD): A machine is used, which fills the abdomen with dialysate and then drains it. This happens several times. It usually is done at night while you are sleeping. When you wake up, you can disconnect from the machine and are free to go to go about your day.  PREPARING FOR EXCHANGES  Discuss the details of the procedure with your caregivers. You will be working with a nurse who is specially trained in doing dialysis. Make sure you understand:   How to do an exchange.   How much solution you need.   What type of solution you will need.   How often you should do an exchange. Ask:   How many times each day?   When? At meals? At bedtime?   Always keep the dialysate bags and other supplies in a cool, clean and dry place.   Keeping everything clean is very important.   The catheter and its cap must be free from germs (sterile)   The adapter also must be sterile. It attaches the dialysis bag and tubing to the catheter.   Clean the area of your body around the catheter every day. Use a chemical that fights infection (antiseptic).   Wash your hands thoroughly before starting an exchange.   You may be taught to wear a mask to cover your nose and mouth. This makes infection less likely to happen.   You may be taught to close doors, windows and turn off any fans before doing an exchange.   Check the dialysate bag very carefully.    Make sure it is the right size bag for you. This information is on the label.   Also, make sure it is the right mixture. For some people, the dialysate contents vary. For instance, the mixture might be a stronger solution for overnight.   Check the expiration date (the last date you can use the bag). It also is on the label. If the date has gone by, throw away the bag.   The solution should be clear. You should be able to see any writing on the side of the bag clearly through the solution. Do not use a cloudy solution.   Gently squeeze the bag to make sure there are no leaks.   Use a dry heating pad to warm the dialysate in the bag. Leave the cover on the bag while you do this.   This is for comfort. You can skip this step if you want.   Never place the bag of solution under warm or hot water. Water from a faucet is not sterile and could cause germs to get into the bag. Infection could then result.  PERFORMING AN EXCHANGE  For continuous ambulatory dialysis:   Attach the dialysis bag and tubing to your catheter. Hang the bag so that gravity (the natural downward pull) draws the solution down and into your abdomen once the clamps are opened. This should take about 10 minutes.   Remove the bag and tubing from the catheter. Cap the catheter.   The solution stays in the abdomen for 3-4 hours (dwell time). The solution is working to clean the blood and remove wastes from your body.   When you are ready to drain the solution for another exchange, take the cap off the catheter. Then, attach the catheter to  tubing, which is attached to an empty bag. Place this empty bag below the abdomen or on the floor or stool and undo the clamps.   Gravity helps pull the fluid out of the abdomen and into the bag. The fluid in the bag may look yellow and clear, like urine. It usually takes about 20 minutes to drain the fluid out of the abdomen.   When the solution has drained, start the process again by  infusing a new bag of dialysate and then capping the catheter.   This should continue until you have used all of the solution that you are to use each day.   Sometimes, a small machine is used overnight. It is called a mini-cycler. This is done if the body cannot go all night without an exchange. The machine lets you sleep without having to get up and do an exchange.   For continuous cycler-assisted dialysis:   You will be taught how to set up or program your machine.   When you are ready for bed, put the dialysate bags onto the cycler machine. Put on exactly the number of bags that your caregiver said to use.   Connect your catheter to the machine and turn the cycler machine on.   Overnight, the cycler will do several exchanges. It often does three to five, sometimes more.   Solution that is in your abdomen in the morning will stay during the day. The machine is set to make the daytime solution stronger, if that is needed.   In the morning, you will disconnect from the machine and cap your catheter and go about your day.   Sometimes, an extra exchange is done during the day. This may be needed to remove excess waste or fluid.  IMPORTANT REMINDERS  You will need to follow a very strict schedule. Every step of the dialysis procedure must be done every day. Sometimes, several times a day. Altogether, this might take an extra 2 hours or more. However, you must stick to the routine. Do not skip a day. Do not skip a procedure.   Some people find it helpful to work with a Social worker or Education officer, museum in addition to the renal (kidney) nurse. They can help you figure out how to change your daily routine to fit in the dialysis sessions.   You may need to change your diet. Ask your caregiver for advice, or talk with a nutritionist about what you should and should not eat.   You will need to weigh yourself every day and keep track of what your weight is.   You may be taught how to check your blood  pressure before every exchange. Your blood pressure reading will help determine what type of solution to use. If your blood pressure is too high, you may need a stronger solution.  RISKS AND COMPLICATIONS  Possible problems vary, depending on the method you use. Your overall health also can have an effect. Problems that could develop because of dialysis include:  Infection. This is the most common problem. It could occur:   In the peritoneum. This is called peritonitis.   Around the catheter.   Weight gain. The dialysate contains a type of sugar known as dextrose. Dextrose has a lot of calories. The body takes in several hundred calories from this sugar each day.   Weakened muscles in the abdomen. This can result from all of the fluid that your body has to hold in the abdomen.   Catheter replacement.  Sometimes, a new one has to be put in.   Change in dialysis method. Due to some complications, you may need to change to hemodialysis for a short time and have your dialysis done at a center.   Trouble adjusting to your new lifestyle. In some people, this leads to depression.   Sleep problems.   Dialysis-related amyloidosis. This sometimes occurs after 5 years of dialysis. Protein builds up in the blood. This can cause painful deposits on bones, joints and tendons (which connect muscle to bone). Or, it can cause hollow spots in bones that make them more likely to break.   Excess fluid. Your body may absorb too much of the fluid that is held in the abdomen. This can lead to heart or lung problems.  SEEK MEDICAL CARE IF:   You have any problems with an exchange.   The area around the catheter becomes red or painful.   The catheter seems loose, or it feels like it is coming out.   A bag of dialysate looks cloudy. Or, the liquid is an unusual color.   Abdominal pain or discomfort.   You feel sick to your stomach (nauseous) or throw up (vomit).   You develop a fever of more than 102 F  (38.9 C).  SEEK IMMEDIATE MEDICAL CARE IF:  You develop a fever of more than 102 F (38.9 C). Document Released: 03/03/2009 Document Revised: 04/25/2011 Document Reviewed: 03/03/2009 [facility] Patient Information 2012 [place].  Diet for Peritoneal Dialysis This diet may be modified in protein, sodium, phosphorus, potassium, or fluid, depending on your needs. The goals of nutrition therapy are similar to those for patients on hemodialysis. Providing enough protein to replace peritoneal losses is a priority. USES OF THIS DIET The diet is designed for the patient with end-stage kidney (renal) disease, who is treated by peritoneal dialysis. Treatment options include:  Continuous Ambulatory Peritoneal Dialysis (CAPD): Usually 4 exchanges of 1.5 to 2 liter volumes of glucose (sugar) and electrolyte-containing dialysate.   Continuous Cyclic Peritoneal Dialysis (CCPD): Essentially a reversal of CAPD, with shorter exchanges at night and a longer one during the day.   Intermittent Peritoneal Dialysis (IPD): 10 to 12 hours of exchanges, 2 to 3 times weekly.  ADEQUACY The diet may not meet the Recommended Dietary Allowances of the [employer] for calcium and ascorbic acid. Protein and water-soluble vitamin needs may be increased because of losses into the dialysate. Recommended daily supplements are the same as for hemodialysis patients. ASSESSMENT/DETERMINATION OF DIET Dietary needs will differ between patients. Parameters must be individualized. Protein  Guidelines: 1.2 to 1.3 gm/kg/day OR 1.5 gm/kg/day if patient is malnourished, catabolic, or has a protracted episode of peritonitis. A minimum of 50% of the protein intake should be of high biological value.   Goals: Meet protein requirements and replace dialysate losses while avoiding excessive accumulation of waste products. Achieve serum albumin greater than 3.5 g/dL.   Evaluate: Current nutritional status, serum albumin and  BUN levels, presence of peritonitis.  Sodium  Guidelines: Usually 90 to 175 mEq (2000 to 4000 mg), but should be individualized.   Goals: Minimize complications of fluid imbalance.   Evaluate: Weight, blood pressure regulation, and presence of swelling (edema).  Potassium  Guidelines: Individualized; often not restricted, and may need to be supplemented.   Goals: Serum K+ levels between 4.0 to 5.0 mEq/L.   Evaluate: Serum K+ levels, usual intake of K+, appetite.  Phosphorus  Guidelines: 800 to 1200 mg/day (the high protein  intake results in a high obligatory P intake).   Goal: Serum P levels between 4.5 to 6.0 mg/dL.   Evaluate: Serum P levels, usual P intake, P-binding medications: type, number, dosage, distribution.  Fluids  Guidelines: Individualized - may not be restricted for all patients.   Goal: Minimize complications of fluid imbalance.   Evaluate: Weight, blood pressure regulation, sodium intake, and presence of edema.  Document Released: 05/06/2005 Document Revised: 04/25/2011 Document Reviewed: 07/29/2006 [facility] Patient Information 2012 [place].

## 2012-12-28 NOTE — Progress Notes (Signed)
Subjective:     Patient ID: Paul [name], male   DOB: 1956-04-28, 57 y.o.   MRN: [id number]  HPI  WHIT TONES  Oct 14, 1955 [id number]  Patient Care Team: [name], MD as PCP - General (Family Medicine) Lucrezia Starch, MD (Nephrology) Ladell Pier, MD as Attending Physician (Internal Medicine) Angelia Mould, MD as Consulting Physician (Vascular Surgery)  This patient is a 57 y.o.male who presents today for surgical evaluation at the request of Dr. Lorrene Reid.   Reason for visit: Renal failure.  Need for dialysis.  Consider peritoneal dialysis  He had a Whipple pancreaticoduodenectomy for Duodenal cancer 2002 by Dr. [name].  He has survived that.  He is cancer free and had been released by his medical oncologist, Dr. Benay Spice.    Unfortunately, he developed renal failure despite aggressive control of diabetes.  Has had hemodialysis through left arm fistula.  However developed a steal syndrome.  Was ligated.  Has a new AV fistula on the right side.  However not working well.  Wished to consider peritoneal dialysis.  He has been using home dialysis five times a week through AV fistula.  That is not working well.  He is now using a catheter in his right internal jugular vein.  His wife helps him with that.  Given the problems with hemodialysis despite a new AV fistula, he wished to explore peritoneal dialysis as an option. No other abdominal surgeries   Patient Active Problem List   Diagnosis Date Noted  . DM (diabetes mellitus) 12/28/2012  . Other complications due to renal dialysis device, implant, and graft 05/19/2012  . Anemia 07/02/2011  . End stage renal disease 05/29/2011    Past Medical History  Diagnosis Date  . Diabetic neuropathy   . Cancer     History of Duodenal Cancer  . Chronic kidney disease     Pt is not eligible for transplant per [facility]  . Diabetes mellitus     Type 2 iddm x 11 yrs  . Anemia     esrd  . Hypertension     meds d/c 'd  2 wks  ago for postural hypotension  . Fatigue      tires easily x 2 months  . Chronic nausea     with  morning vomiting; better off  HTN meds  . Intermittent tremor   . Closed left arm fracture 1967  . Heart murmur   . Corneal abrasion, left   . Shortness of breath     exertional  . GERD (gastroesophageal reflux disease)   . History of blood transfusion   . Lack of coordination   . Muscle weakness (generalized)     Past Surgical History  Procedure Laterality Date  . Whipple procedure  2002    duodenal ca  . Bone marrow biopsy  2012  . Renal biopsy, percutaneous  2012  . Tonsillectomy    . Portacath placement  2002  . Biliary diversion, external  2002  . Other surgical history      Cyst removed from back  . Av fistula placement  06/07/2011    Procedure: ARTERIOVENOUS (AV) FISTULA CREATION;  Surgeon: Angelia Mould, MD;  Location: [facility];  Service: Vascular;  Laterality: Left;  . Ligation of arteriovenous  fistula  05/22/2012    Procedure: LIGATION OF ARTERIOVENOUS  FISTULA;  Surgeon: Mal Misty, MD;  Location: [facility];  Service: Vascular;  Laterality: Left;  Left brachio-cephalic fistula  . Insertion  of dialysis catheter  05/22/2012    Procedure: INSERTION OF DIALYSIS CATHETER;  Surgeon: Mal Misty, MD;  Location: [facility];  Service: Vascular;  Laterality: N/A;  right internal jugular vein  . Av fistula placement  06/18/2012    Procedure: ARTERIOVENOUS (AV) FISTULA CREATION;  Surgeon: Mal Misty, MD;  Location: [facility];  Service: Vascular;  Laterality: Right;  [place]    History   Social History  . Marital Status: Married    Spouse Name: N/A    Number of Children: N/A  . Years of Education: N/A   Occupational History  . Not on file.   Social History Main Topics  . Smoking status: Never Smoker   . Smokeless tobacco: Never Used  . Alcohol Use: No  . Drug Use: No  . Sexually Active: Not on file   Other Topics Concern  . Not on file   Social History Narrative  .  No narrative on file    Family History  Problem Relation Age of Onset  . Cancer Mother   . Cancer Father     prostate cancer  . Heart disease Maternal Grandfather   . Stroke Paternal Grandmother     Current Outpatient Prescriptions  Medication Sig Dispense Refill  . aspirin EC 81 MG tablet Take 81 mg by mouth daily.      [name] atorvastatin (LIPITOR) 10 MG tablet Take 10 mg by mouth daily.        . calcitRIOL (ROCALTROL) 0.25 MCG capsule Take 0.25 mcg by mouth daily.       . calcium acetate (PHOSLO) 667 MG capsule Take 667 mg by mouth 3 (three) times daily with meals.       . Cholecalciferol (VITAMIN D PO) Take 1 capsule by mouth daily.      . insulin glargine (LANTUS) 100 UNIT/ML injection Inject 20 Units into the skin every morning.       . lidocaine-prilocaine (EMLA) cream       . metoCLOPramide (REGLAN) 5 MG tablet Take 5 mg by mouth 4 (four) times daily -  with meals and at bedtime.       [name] omeprazole (PRILOSEC) 40 MG capsule Take 40 mg by mouth daily.      . ONE TOUCH ULTRA TEST test strip       . Potassium Chloride (K-LOR PO) Take 20 mEq by mouth 2 (two) times daily.      . sertraline (ZOLOFT) 50 MG tablet       . Epoetin Alfa (EPOGEN IJ) Inject as directed 3 (three) times a week. Monday, Wednesday, friday       No current facility-administered medications for this visit.     Allergies  Allergen Reactions  . Metformin And Related Other (See Comments)    Kidney problems    BP 138/80  Pulse 62  Resp 14  Ht 6\' 1"  (1.854 m)  Wt 181 lb 9.6 oz (82.373 kg)  BMI 23.96 kg/m2  No results found.   Review of Systems  Constitutional: Negative for fever, chills and diaphoresis.  HENT: Negative for nosebleeds, sore throat, facial swelling, mouth sores, trouble swallowing and ear discharge.   Eyes: Negative for photophobia, discharge and visual disturbance.  Respiratory: Negative for choking, chest tightness, shortness of breath and stridor.   Cardiovascular: Negative for chest  pain and palpitations.  Gastrointestinal: Negative for nausea, vomiting, abdominal pain, diarrhea, constipation, blood in stool, abdominal distention, anal bleeding and rectal pain.  Endocrine: Negative for cold  intolerance and heat intolerance.  Genitourinary: Negative for dysuria, urgency, difficulty urinating and testicular pain.  Musculoskeletal: Negative for myalgias, back pain, arthralgias and gait problem.  Skin: Negative for color change, pallor, rash and wound.  Allergic/Immunologic: Negative for environmental allergies and food allergies.  Neurological: Negative for dizziness, speech difficulty, weakness, numbness and headaches.  Hematological: Negative for adenopathy. Does not bruise/bleed easily.  Psychiatric/Behavioral: Negative for hallucinations, confusion and agitation.       Objective:   Physical Exam  Constitutional: He is oriented to person, place, and time. He appears well-developed and well-nourished. No distress.  HENT:  Head: Normocephalic.  Mouth/Throat: Oropharynx is clear and moist. No oropharyngeal exudate.  Eyes: Conjunctivae and EOM are normal. Pupils are equal, round, and reactive to light. No scleral icterus.  Neck: Normal range of motion. Neck supple. No tracheal deviation present.  Cardiovascular: Normal rate, regular rhythm and intact distal pulses.   Pulmonary/Chest: Effort normal and breath sounds normal. No respiratory distress.  Abdominal: Soft. He exhibits no distension. There is no tenderness. No hernia. Hernia confirmed negative in the ventral area, confirmed negative in the right inguinal area and confirmed negative in the left inguinal area.    Musculoskeletal: Normal range of motion. He exhibits no tenderness.  Lymphadenopathy:    He has no cervical adenopathy.       Right: No inguinal adenopathy present.       Left: No inguinal adenopathy present.  Neurological: He is alert and oriented to person, place, and time. No cranial nerve deficit.  He exhibits normal muscle tone. Coordination normal.  Skin: Skin is warm and dry. No rash noted. He is not diaphoretic. No erythema. No pallor.  Psychiatric: He has a normal mood and affect. His behavior is normal. Judgment and thought content normal.       Assessment:     Renal failure with poor hemodialysis fistula tolerance.  Request for peritoneal dialysis catheter placement in patient with prior Whipple pancreatic duodenectomy     Plan:     I think it is reasonable to perform diagnostic laparoscopy.  If he has no adhesions and a working space infraumbilically, place peritoneal dialysis catheter.  Plan left suprapubic region below the belt line since he is right-handed.  The anatomy & physiology of peritoneum was discussed.  Natural history risks without surgery of worsening renal failure was discussed.   I feel the risks of no intervention will lead to serious problems that outweigh the operative risks; therefore, I recommended placement of a peritoneal dialysis catheter.  I explained laparoscopic techniques with possible need for an open approach.    Risks such as bleeding, infection, abscess, injury to other organs, catheter occlusion or malpositioning, reoperation to remove/reposition the catheter, heart attack, death, and other risks were discussed.   I noted a good likelihood this will help address the problem.  Possibility that this will not be enough to compensate for the renal failure & need for further treatment such as hemodialysis was explained.  Goals of post-operative recovery were discussed as well.  We will work to minimize complications.   The patient is/will be getting training on catheter use by dialysis nursing before and after surgery.  I stressed the importance of meticulous care & sterile technique to prevent catheter problems.  Questions were answered.  The patient expresses understanding & wishes to proceed with surgery.

## 2013-01-13 ENCOUNTER — Encounter (HOSPITAL_COMMUNITY): Payer: Self-pay | Admitting: *Deleted

## 2013-01-13 MED ORDER — CEFAZOLIN SODIUM-DEXTROSE 2-3 GM-% IV SOLR
2.0000 g | INTRAVENOUS | Status: AC
  Administered 2013-01-14: 2 g via INTRAVENOUS
  Filled 2013-01-13: qty 50

## 2013-01-13 NOTE — Progress Notes (Signed)
Pt denies SOB, chest pain, and being under the care of a cardiologist. Pt stated that he had an echo at [facility] " around 4-5 weeks ago." Results were requested along with latest office notes. Pt also stated thad he had a stress  test at [facility] in June. Pt made aware to stop taking Aspirin and herbal medications. Do not take any NSAIDs ie: Ibuprofen, Advil, Naproxen or any medication containing Aspirin.

## 2013-01-14 ENCOUNTER — Encounter (HOSPITAL_COMMUNITY): Payer: Self-pay | Admitting: Anesthesiology

## 2013-01-14 ENCOUNTER — Encounter (HOSPITAL_COMMUNITY)
Admission: RE | Disposition: A | Discharge status: Home / Self Care | Payer: Self-pay | Source: Ambulatory Visit | Attending: Surgery

## 2013-01-14 ENCOUNTER — Ambulatory Visit (HOSPITAL_COMMUNITY)
Admission: RE | Admit: 2013-01-14 | Discharge: 2013-01-14 | Disposition: A | Discharge status: Home / Self Care | Payer: Managed Care, Other (non HMO) | Source: Ambulatory Visit | Attending: Surgery | Admitting: Surgery

## 2013-01-14 ENCOUNTER — Ambulatory Visit (HOSPITAL_COMMUNITY): Payer: Managed Care, Other (non HMO) | Admitting: Anesthesiology

## 2013-01-14 ENCOUNTER — Encounter (HOSPITAL_COMMUNITY): Payer: Self-pay | Admitting: *Deleted

## 2013-01-14 DIAGNOSIS — Z8509 Personal history of malignant neoplasm of other digestive organs: Secondary | ICD-10-CM | POA: Insufficient documentation

## 2013-01-14 DIAGNOSIS — Z90410 Acquired total absence of pancreas: Secondary | ICD-10-CM | POA: Insufficient documentation

## 2013-01-14 DIAGNOSIS — D631 Anemia in chronic kidney disease: Secondary | ICD-10-CM | POA: Insufficient documentation

## 2013-01-14 DIAGNOSIS — N19 Unspecified kidney failure: Secondary | ICD-10-CM

## 2013-01-14 DIAGNOSIS — E1142 Type 2 diabetes mellitus with diabetic polyneuropathy: Secondary | ICD-10-CM | POA: Insufficient documentation

## 2013-01-14 DIAGNOSIS — Z794 Long term (current) use of insulin: Secondary | ICD-10-CM | POA: Insufficient documentation

## 2013-01-14 DIAGNOSIS — E1149 Type 2 diabetes mellitus with other diabetic neurological complication: Secondary | ICD-10-CM | POA: Insufficient documentation

## 2013-01-14 DIAGNOSIS — Z888 Allergy status to other drugs, medicaments and biological substances status: Secondary | ICD-10-CM | POA: Insufficient documentation

## 2013-01-14 DIAGNOSIS — Z7982 Long term (current) use of aspirin: Secondary | ICD-10-CM | POA: Insufficient documentation

## 2013-01-14 DIAGNOSIS — I120 Hypertensive chronic kidney disease with stage 5 chronic kidney disease or end stage renal disease: Secondary | ICD-10-CM | POA: Insufficient documentation

## 2013-01-14 DIAGNOSIS — Z79899 Other long term (current) drug therapy: Secondary | ICD-10-CM | POA: Insufficient documentation

## 2013-01-14 DIAGNOSIS — N186 End stage renal disease: Secondary | ICD-10-CM | POA: Insufficient documentation

## 2013-01-14 DIAGNOSIS — K219 Gastro-esophageal reflux disease without esophagitis: Secondary | ICD-10-CM | POA: Insufficient documentation

## 2013-01-14 DIAGNOSIS — Z992 Dependence on renal dialysis: Secondary | ICD-10-CM | POA: Insufficient documentation

## 2013-01-14 DIAGNOSIS — Z9049 Acquired absence of other specified parts of digestive tract: Secondary | ICD-10-CM | POA: Insufficient documentation

## 2013-01-14 HISTORY — PX: CAPD INSERTION: SHX5233

## 2013-01-14 HISTORY — DX: Depression, unspecified: F32.A

## 2013-01-14 HISTORY — DX: Major depressive disorder, single episode, unspecified: F32.9

## 2013-01-14 LAB — GLUCOSE, CAPILLARY
Glucose-Capillary: 119 mg/dL — ABNORMAL HIGH (ref 70–99)
Glucose-Capillary: 103 mg/dL — ABNORMAL HIGH (ref 70–99)

## 2013-01-14 LAB — POCT I-STAT 4, (NA,K, GLUC, HGB,HCT)
Hemoglobin: 10.2 g/dL — ABNORMAL LOW (ref 13.0–17.0)
Potassium: 4.6 mEq/L (ref 3.5–5.1)
Sodium: 140 mEq/L (ref 135–145)

## 2013-01-14 SURGERY — LAPAROSCOPIC INSERTION CONTINUOUS AMBULATORY PERITONEAL DIALYSIS  (CAPD) CATHETER
Anesthesia: General | Wound class: Clean

## 2013-01-14 MED ORDER — NEOSTIGMINE METHYLSULFATE 1 MG/ML IJ SOLN
INTRAMUSCULAR | Status: DC | PRN
  Administered 2013-01-14: 3 mg via INTRAVENOUS

## 2013-01-14 MED ORDER — GLYCOPYRROLATE 0.2 MG/ML IJ SOLN
INTRAMUSCULAR | Status: DC | PRN
  Administered 2013-01-14: 0.4 mg via INTRAVENOUS

## 2013-01-14 MED ORDER — OXYCODONE HCL 5 MG PO TABS: 5.0000 mg | ORAL_TABLET | Freq: Once | ORAL | Status: DC | PRN

## 2013-01-14 MED ORDER — ONDANSETRON HCL 4 MG/2ML IJ SOLN: 4.0000 mg | Freq: Four times a day (QID) | INTRAMUSCULAR | Status: DC | PRN

## 2013-01-14 MED ORDER — SODIUM CHLORIDE 0.9 % IR SOLN
Status: DC | PRN
  Administered 2013-01-14: 15:00:00

## 2013-01-14 MED ORDER — SODIUM CHLORIDE 0.9 % IV SOLN
INTRAVENOUS | Status: DC
  Administered 2013-01-14 (×2): via INTRAVENOUS

## 2013-01-14 MED ORDER — PHENYLEPHRINE HCL 10 MG/ML IJ SOLN
INTRAMUSCULAR | Status: DC | PRN
  Administered 2013-01-14 (×2): 80 ug via INTRAVENOUS

## 2013-01-14 MED ORDER — ROCURONIUM BROMIDE 100 MG/10ML IV SOLN
INTRAVENOUS | Status: DC | PRN
  Administered 2013-01-14: 30 mg via INTRAVENOUS
  Administered 2013-01-14: 10 mg via INTRAVENOUS

## 2013-01-14 MED ORDER — FENTANYL CITRATE 0.05 MG/ML IJ SOLN: 25.0000 ug | INTRAMUSCULAR | Status: DC | PRN

## 2013-01-14 MED ORDER — ONDANSETRON HCL 4 MG/2ML IJ SOLN
INTRAMUSCULAR | Status: DC | PRN
  Administered 2013-01-14: 4 mg via INTRAVENOUS

## 2013-01-14 MED ORDER — OXYCODONE HCL 5 MG PO TABS: 5.0000 mg | ORAL_TABLET | Freq: Four times a day (QID) | ORAL | Status: DC | PRN

## 2013-01-14 MED ORDER — LIDOCAINE HCL (CARDIAC) 20 MG/ML IV SOLN
INTRAVENOUS | Status: DC | PRN
  Administered 2013-01-14: 80 mg via INTRAVENOUS

## 2013-01-14 MED ORDER — 0.9 % SODIUM CHLORIDE (POUR BTL) OPTIME
TOPICAL | Status: DC | PRN
  Administered 2013-01-14: 1000 mL

## 2013-01-14 MED ORDER — PROPOFOL 10 MG/ML IV BOLUS
INTRAVENOUS | Status: DC | PRN
  Administered 2013-01-14: 180 mg via INTRAVENOUS

## 2013-01-14 MED ORDER — LACTATED RINGERS IV SOLN: INTRAVENOUS | Status: DC | PRN

## 2013-01-14 MED ORDER — OXYCODONE HCL 5 MG/5ML PO SOLN: 5.0000 mg | Freq: Once | ORAL | Status: DC | PRN

## 2013-01-14 MED ORDER — BUPIVACAINE-EPINEPHRINE 0.25% -1:200000 IJ SOLN
INTRAMUSCULAR | Status: DC | PRN
  Administered 2013-01-14: 30 mL

## 2013-01-14 MED ORDER — BUPIVACAINE-EPINEPHRINE PF 0.25-1:200000 % IJ SOLN
INTRAMUSCULAR | Status: AC
  Filled 2013-01-14: qty 30

## 2013-01-14 MED ORDER — MIDAZOLAM HCL 5 MG/5ML IJ SOLN
INTRAMUSCULAR | Status: DC | PRN
  Administered 2013-01-14: 2 mg via INTRAVENOUS

## 2013-01-14 MED ORDER — LACTATED RINGERS IV SOLN: INTRAVENOUS | Status: DC

## 2013-01-14 MED ORDER — FENTANYL CITRATE 0.05 MG/ML IJ SOLN
INTRAMUSCULAR | Status: DC | PRN
  Administered 2013-01-14: 150 ug via INTRAVENOUS
  Administered 2013-01-14: 50 ug via INTRAVENOUS

## 2013-01-14 SURGICAL SUPPLY — 47 items
ADAPTER CATH SAFE LCK II CO PK (MISCELLANEOUS) ×1 IMPLANT
ADAPTER SAFE LOCK II CO PACK (MISCELLANEOUS) ×1
ADPR DLYS CATH INSRT WRNCH TY (MISCELLANEOUS) ×1
BAG DECANTER FOR FLEXI CONT (MISCELLANEOUS) ×2 IMPLANT
BLADE SURG ROTATE 9660 (MISCELLANEOUS) IMPLANT
CANISTER SUCTION 2500CC (MISCELLANEOUS) IMPLANT
CATH MONCRIEF POPOVICH (CATHETERS) IMPLANT
CHLORAPREP W/TINT 26ML (MISCELLANEOUS) ×2 IMPLANT
CLOTH BEACON ORANGE TIMEOUT ST (SAFETY) ×2 IMPLANT
COIL SWAN NECK LT (MISCELLANEOUS) IMPLANT
COIL SWAN NECK RT (MISCELLANEOUS) IMPLANT
COVER SURGICAL LIGHT HANDLE (MISCELLANEOUS) ×2 IMPLANT
DECANTER SPIKE VIAL GLASS SM (MISCELLANEOUS) ×2 IMPLANT
DISSECTOR BLUNT TIP ENDO 5MM (MISCELLANEOUS) IMPLANT
DRAPE WARM FLUID 44X44 (DRAPE) ×2 IMPLANT
DRSG OPSITE 4X5.5 SM (GAUZE/BANDAGES/DRESSINGS) IMPLANT
DRSG OPSITE 6X11 MED (GAUZE/BANDAGES/DRESSINGS) ×1 IMPLANT
DRSG PAD ABDOMINAL 8X10 ST (GAUZE/BANDAGES/DRESSINGS) IMPLANT
DRSG TEGADERM 2-3/8X2-3/4 SM (GAUZE/BANDAGES/DRESSINGS) ×3 IMPLANT
DRSG TEGADERM 4X4.75 (GAUZE/BANDAGES/DRESSINGS) ×1 IMPLANT
ELECT REM PT RETURN 9FT ADLT (ELECTROSURGICAL) ×2
ELECTRODE REM PT RTRN 9FT ADLT (ELECTROSURGICAL) ×1 IMPLANT
GAUZE SPONGE 2X2 8PLY STRL LF (GAUZE/BANDAGES/DRESSINGS) IMPLANT
GLOVE BIOGEL PI IND STRL 8 (GLOVE) ×1 IMPLANT
GLOVE BIOGEL PI INDICATOR 8 (GLOVE) ×1
GLOVE ECLIPSE 8.0 STRL XLNG CF (GLOVE) ×2 IMPLANT
GOWN STRL NON-REIN LRG LVL3 (GOWN DISPOSABLE) ×4 IMPLANT
GOWN STRL REIN XL XLG (GOWN DISPOSABLE) ×2 IMPLANT
KIT BASIN OR (CUSTOM PROCEDURE TRAY) ×2 IMPLANT
KIT ROOM TURNOVER OR (KITS) ×2 IMPLANT
NEEDLE 22X1 1/2 (OR ONLY) (NEEDLE) ×2 IMPLANT
NS IRRIG 1000ML POUR BTL (IV SOLUTION) ×2 IMPLANT
PAD ARMBOARD 7.5X6 YLW CONV (MISCELLANEOUS) ×4 IMPLANT
SET EXTENSION TUBING 8  CATH (SET/KITS/TRAYS/PACK) ×2 IMPLANT
SET IRRIG TUBING LAPAROSCOPIC (IRRIGATION / IRRIGATOR) IMPLANT
SLEEVE ENDOPATH XCEL 5M (ENDOMECHANICALS) ×1 IMPLANT
SPONGE GAUZE 2X2 STER 10/PKG (GAUZE/BANDAGES/DRESSINGS)
SPONGE GAUZE 4X4 12PLY (GAUZE/BANDAGES/DRESSINGS) ×1 IMPLANT
STRIP CLOSURE SKIN 1/4X4 (GAUZE/BANDAGES/DRESSINGS) ×2 IMPLANT
SUT MNCRL AB 4-0 PS2 18 (SUTURE) ×2 IMPLANT
SUT PROLENE 2 0 CT2 30 (SUTURE) ×6 IMPLANT
TOWEL OR 17X24 6PK STRL BLUE (TOWEL DISPOSABLE) ×2 IMPLANT
TOWEL OR 17X26 10 PK STRL BLUE (TOWEL DISPOSABLE) ×2 IMPLANT
TRAY LAPAROSCOPIC (CUSTOM PROCEDURE TRAY) ×2 IMPLANT
TROCAR FALLER TUNNELING (TROCAR) ×3 IMPLANT
TROCAR XCEL NON-BLD 11X100MML (ENDOMECHANICALS) ×2 IMPLANT
TROCAR XCEL NON-BLD 5MMX100MML (ENDOMECHANICALS) ×2 IMPLANT

## 2013-01-14 NOTE — Interval H&P Note (Signed)
History and Physical Interval Note:  01/14/2013 1:44 PM  Paul [name]  has presented today for surgery, with the diagnosis of  Renal failure.  Need for dialysis.         The various methods of treatment have been discussed with the patient and family. After consideration of risks, benefits and other options for treatment, the patient has consented to  Procedure(s): [place]  (CAPD) CATHETER (N/A) as a surgical intervention .  The patient's history has been reviewed, patient examined, no change in status, stable for surgery.  I have reviewed the patient's chart and labs.  Questions were answered to the patient's satisfaction.     Davena Julian C.

## 2013-01-14 NOTE — Op Note (Signed)
01/14/2013  3:05 PM  PATIENT:  Paul [name]  57 y.o. male  Patient Care Team: [name], MD as PCP - General (Family Medicine) Lucrezia Starch, MD (Nephrology) Ladell Pier, MD as Attending Physician (Internal Medicine) Angelia Mould, MD as Consulting Physician (Vascular Surgery)  PRE-OPERATIVE DIAGNOSIS:   Renal failure.  Need for dialysis.        POST-OPERATIVE DIAGNOSIS:   Renal failure.  Need for dialysis.   PROCEDURE:   Diagnostic laparoscopy  LAPAROSCOPIC INSERTION CONTINUOUS AMBULATORY PERITONEAL DIALYSIS  (CAPD) CATHETER  SURGEON:  Surgeon(s): Adin Hector, MD  ANESTHESIA:   local and general  EBL:  Total I/O In: 250 [I.V.:250] Out: -   Delay start of Pharmacological VTE agent (>24hrs) due to surgical blood loss or risk of bleeding:  no  DRAINS: [place] Curl Cath double-cuff CAPD peritoneal dialysis catheter, 62.5 cm length.  The curl tip of the catheter rests in the deep pelvis.     SPECIMEN:  No Specimen  DISPOSITION OF SPECIMEN:  N/A  COUNTS:  YES  PLAN OF CARE: Discharge to home after PACU  PATIENT DISPOSITION:  PACU - hemodynamically stable.  INDICATION: Pleasant male s/p Whipple with need for dialysis.  Poor IV options & wished to consider CAPD.  The anatomy & physiology of peritoneum was discussed.  Natural history risks without surgery of worsening renal failure was discussed.   I feel the risks of no intervention will lead to serious problems that outweigh the operative risks; therefore, I recommended placement of a peritoneal dialysis catheter.  I explained laparoscopic techniques with possible need for an open approach.    Risks such as bleeding, infection, abscess, injury to other organs, catheter occlusion or malpositioning, reoperation to remove/reposition the catheter, heart attack, death, and other risks were discussed.   I noted a good likelihood this will help address the problem.  Possibility that this will not be enough  to compensate for the renal failure & need for further treatment such as hemodialysis was explained.  Goals of post-operative recovery were discussed as well.  We will work to minimize complications.   The patient is/will be getting training on catheter use by dialysis nursing before and after surgery.  I stressed the importance of meticulous care & sterile technique to prevent catheter problems.  Questions were answered.  The patient expresses understanding & wishes to proceed with surgery.   OR FINDINGS: Mild epigastric adhesions of greater omentum & transverse colon.  No infraumbilical adhesions.  No evidence of meatsatic disease  It is a [place] Curl Cath double-cuff CAPD peritoneal dialysis catheter, 62.5 cm length.  The curl tip of the catheter rests in the deep pelvis.   The exit site of the catheter on the skin is in the left paramedian suprapubic region  below the infraumbilical belt line.  DESCRIPTION:   Informed consent was confirmed.  The patient underwent general anaesthesia without difficulty.  The patient was positioned appropriately.  VTE prevention in place.  The patient's abdomen was clipped, prepped, & draped in a sterile fashion.  Surgical timeout confirmed our plan.  The patient was positioned in reverse Trendelenburg.  Abdominal entry was gained using modified Hasson cutdown technique in the infraumbilical space.  Entry was clean.  5 mm port placed easily.  I induced carbon dioxide insufflation.  Camera inspection revealed no injury.  Extra ports were carefully placed under direct laparoscopic visualization.  Adhesions of greater omentum and mid transverse colon were noted in the epigastric  region.  Because the omentum was fixed above the umbilicus, I saw no need for an omentopexy.  I saw no evidence of metastatic disease.    I tunneled a 68mm dilating port through the abdominal wall from the left paramedian incision to exit into the peritoneum just cephalad to the dome of the  bladder.  I placed the CAPD catheter through the 60mm port.  The curl of the CAPD catheter rested down in the cul de sac of the deep pelvis.  I tacked the catheter to the peritoneum of Anterior pubic rim along the left posterior dome of the bladder gently with a 2-0 prolene stitch acting as a gentle sling to keep the catheter from flipping back up out of the pelvis.    Hemostasis was excellent.  I tunnelled the catheter in the abdominal wall to the left suprapubic region below the patient's beltline.  The catheter flushed and aspirated easily. I did a final flush of heparinized saline into the CAPD catheter.  I removed the ports.  I closed the skin using 4-0 monocryl stitch.  Sterile dressings were applied. The patient was extubated & arrived in the PACU in stable condition..  I had discussed postoperative care with the patient in the holding area. I am about to locate the patient's family and discuss operative findings and postoperative goals / instructions.  Instructions are written in the chart as well.

## 2013-01-14 NOTE — Anesthesia Procedure Notes (Signed)
Procedure Name: Intubation Date/Time: 01/14/2013 2:10 PM Performed by: Jenne Campus Pre-anesthesia Checklist: Patient identified, Emergency Drugs available, Suction available, Patient being monitored and Timeout performed Patient Re-evaluated:Patient Re-evaluated prior to inductionOxygen Delivery Method: Circle system utilized Preoxygenation: Pre-oxygenation with 100% oxygen Intubation Type: IV induction Ventilation: Mask ventilation without difficulty Laryngoscope Size: Miller and 3 Grade View: Grade I Tube type: Oral Tube size: 7.5 mm Number of attempts: 1 Airway Equipment and Method: Stylet Placement Confirmation: ETT inserted through vocal cords under direct vision,  positive ETCO2,  CO2 detector and breath sounds checked- equal and bilateral Secured at: 23 cm Tube secured with: Tape Dental Injury: Teeth and Oropharynx as per pre-operative assessment

## 2013-01-14 NOTE — Preoperative (Signed)
Beta Blockers   Reason not to administer Beta Blockers:Not Applicable 

## 2013-01-14 NOTE — Transfer of Care (Signed)
Immediate Anesthesia Transfer of Care Note  Patient: Paul [name]  Procedure(s) Performed: Procedure(s): LAPAROSCOPIC INSERTION CONTINUOUS AMBULATORY PERITONEAL DIALYSIS  (CAPD) CATHETER (N/A)  Patient Location: PACU  Anesthesia Type:General  Level of Consciousness: oriented and patient cooperative  Airway & Oxygen Therapy: Patient Spontanous Breathing and Patient connected to nasal cannula oxygen  Post-op Assessment: Report given to PACU RN and Post -op Vital signs reviewed and stable  Post vital signs: Reviewed and stable  Complications: No apparent anesthesia complications

## 2013-01-14 NOTE — H&P (View-Only) (Signed)
Subjective:     Patient ID: Paul [name], male   DOB: 1955-06-19, 57 y.o.   MRN: [id number]  HPI  BYREN AST  01/26/1956 [id number]  Patient Care Team: [name], MD as PCP - General (Family Medicine) Lucrezia Starch, MD (Nephrology) Ladell Pier, MD as Attending Physician (Internal Medicine) Angelia Mould, MD as Consulting Physician (Vascular Surgery)  This patient is a 57 y.o.male who presents today for surgical evaluation at the request of Dr. Lorrene Reid.   Reason for visit: Renal failure.  Need for dialysis.  Consider peritoneal dialysis  He had a Whipple pancreaticoduodenectomy for Duodenal cancer 2002 by Dr. [name].  He has survived that.  He is cancer free and had been released by his medical oncologist, Dr. Benay Spice.    Unfortunately, he developed renal failure despite aggressive control of diabetes.  Has had hemodialysis through left arm fistula.  However developed a steal syndrome.  Was ligated.  Has a new AV fistula on the right side.  However not working well.  Wished to consider peritoneal dialysis.  He has been using home dialysis five times a week through AV fistula.  That is not working well.  He is now using a catheter in his right internal jugular vein.  His wife helps him with that.  Given the problems with hemodialysis despite a new AV fistula, he wished to explore peritoneal dialysis as an option. No other abdominal surgeries   Patient Active Problem List   Diagnosis Date Noted  . DM (diabetes mellitus) 12/28/2012  . Other complications due to renal dialysis device, implant, and graft 05/19/2012  . Anemia 07/02/2011  . End stage renal disease 05/29/2011    Past Medical History  Diagnosis Date  . Diabetic neuropathy   . Cancer     History of Duodenal Cancer  . Chronic kidney disease     Pt is not eligible for transplant per [facility]  . Diabetes mellitus     Type 2 iddm x 11 yrs  . Anemia     esrd  . Hypertension     meds d/c 'd  2 wks  ago for postural hypotension  . Fatigue      tires easily x 2 months  . Chronic nausea     with  morning vomiting; better off  HTN meds  . Intermittent tremor   . Closed left arm fracture 1967  . Heart murmur   . Corneal abrasion, left   . Shortness of breath     exertional  . GERD (gastroesophageal reflux disease)   . History of blood transfusion   . Lack of coordination   . Muscle weakness (generalized)     Past Surgical History  Procedure Laterality Date  . Whipple procedure  2002    duodenal ca  . Bone marrow biopsy  2012  . Renal biopsy, percutaneous  2012  . Tonsillectomy    . Portacath placement  2002  . Biliary diversion, external  2002  . Other surgical history      Cyst removed from back  . Av fistula placement  06/07/2011    Procedure: ARTERIOVENOUS (AV) FISTULA CREATION;  Surgeon: Angelia Mould, MD;  Location: [place] [facility];  Service: Vascular;  Laterality: Left;  . Ligation of arteriovenous  fistula  05/22/2012    Procedure: LIGATION OF ARTERIOVENOUS  FISTULA;  Surgeon: Mal Misty, MD;  Location: [place];  Service: Vascular;  Laterality: Left;  Left brachio-cephalic fistula  . Insertion  of dialysis catheter  05/22/2012    Procedure: INSERTION OF DIALYSIS CATHETER;  Surgeon: Mal Misty, MD;  Location: [place];  Service: Vascular;  Laterality: N/A;  right internal jugular vein  . Av fistula placement  06/18/2012    Procedure: ARTERIOVENOUS (AV) FISTULA CREATION;  Surgeon: Mal Misty, MD;  Location: [place];  Service: Vascular;  Laterality: Right;  [place]    History   Social History  . Marital Status: Married    Spouse Name: N/A    Number of Children: N/A  . Years of Education: N/A   Occupational History  . Not on file.   Social History Main Topics  . Smoking status: Never Smoker   . Smokeless tobacco: Never Used  . Alcohol Use: No  . Drug Use: No  . Sexually Active: Not on file   Other Topics Concern  . Not on file   Social History Narrative  .  No narrative on file    Family History  Problem Relation Age of Onset  . Cancer Mother   . Cancer Father     prostate cancer  . Heart disease Maternal Grandfather   . Stroke Paternal Grandmother     Current Outpatient Prescriptions  Medication Sig Dispense Refill  . aspirin EC 81 MG tablet Take 81 mg by mouth daily.      [name] atorvastatin (LIPITOR) 10 MG tablet Take 10 mg by mouth daily.        . calcitRIOL (ROCALTROL) 0.25 MCG capsule Take 0.25 mcg by mouth daily.       . calcium acetate (PHOSLO) 667 MG capsule Take 667 mg by mouth 3 (three) times daily with meals.       . Cholecalciferol (VITAMIN D PO) Take 1 capsule by mouth daily.      . insulin glargine (LANTUS) 100 UNIT/ML injection Inject 20 Units into the skin every morning.       . lidocaine-prilocaine (EMLA) cream       . metoCLOPramide (REGLAN) 5 MG tablet Take 5 mg by mouth 4 (four) times daily -  with meals and at bedtime.       [name] omeprazole (PRILOSEC) 40 MG capsule Take 40 mg by mouth daily.      . ONE TOUCH ULTRA TEST test strip       . Potassium Chloride (K-LOR PO) Take 20 mEq by mouth 2 (two) times daily.      . sertraline (ZOLOFT) 50 MG tablet       . Epoetin Alfa (EPOGEN IJ) Inject as directed 3 (three) times a week. Monday, Wednesday, friday       No current facility-administered medications for this visit.     Allergies  Allergen Reactions  . Metformin And Related Other (See Comments)    Kidney problems    BP 138/80  Pulse 62  Resp 14  Ht 6\' 1"  (1.854 m)  Wt 181 lb 9.6 oz (82.373 kg)  BMI 23.96 kg/m2  No results found.   Review of Systems  Constitutional: Negative for fever, chills and diaphoresis.  HENT: Negative for nosebleeds, sore throat, facial swelling, mouth sores, trouble swallowing and ear discharge.   Eyes: Negative for photophobia, discharge and visual disturbance.  Respiratory: Negative for choking, chest tightness, shortness of breath and stridor.   Cardiovascular: Negative for chest  pain and palpitations.  Gastrointestinal: Negative for nausea, vomiting, abdominal pain, diarrhea, constipation, blood in stool, abdominal distention, anal bleeding and rectal pain.  Endocrine: Negative for cold  intolerance and heat intolerance.  Genitourinary: Negative for dysuria, urgency, difficulty urinating and testicular pain.  Musculoskeletal: Negative for myalgias, back pain, arthralgias and gait problem.  Skin: Negative for color change, pallor, rash and wound.  Allergic/Immunologic: Negative for environmental allergies and food allergies.  Neurological: Negative for dizziness, speech difficulty, weakness, numbness and headaches.  Hematological: Negative for adenopathy. Does not bruise/bleed easily.  Psychiatric/Behavioral: Negative for hallucinations, confusion and agitation.       Objective:   Physical Exam  Constitutional: He is oriented to person, place, and time. He appears well-developed and well-nourished. No distress.  HENT:  Head: Normocephalic.  Mouth/Throat: Oropharynx is clear and moist. No oropharyngeal exudate.  Eyes: Conjunctivae and EOM are normal. Pupils are equal, round, and reactive to light. No scleral icterus.  Neck: Normal range of motion. Neck supple. No tracheal deviation present.  Cardiovascular: Normal rate, regular rhythm and intact distal pulses.   Pulmonary/Chest: Effort normal and breath sounds normal. No respiratory distress.  Abdominal: Soft. He exhibits no distension. There is no tenderness. No hernia. Hernia confirmed negative in the ventral area, confirmed negative in the right inguinal area and confirmed negative in the left inguinal area.    Musculoskeletal: Normal range of motion. He exhibits no tenderness.  Lymphadenopathy:    He has no cervical adenopathy.       Right: No inguinal adenopathy present.       Left: No inguinal adenopathy present.  Neurological: He is alert and oriented to person, place, and time. No cranial nerve deficit.  He exhibits normal muscle tone. Coordination normal.  Skin: Skin is warm and dry. No rash noted. He is not diaphoretic. No erythema. No pallor.  Psychiatric: He has a normal mood and affect. His behavior is normal. Judgment and thought content normal.       Assessment:     Renal failure with poor hemodialysis fistula tolerance.  Request for peritoneal dialysis catheter placement in patient with prior Whipple pancreatic duodenectomy     Plan:     I think it is reasonable to perform diagnostic laparoscopy.  If he has no adhesions and a working space infraumbilically, place peritoneal dialysis catheter.  Plan left suprapubic region below the belt line since he is right-handed.  The anatomy & physiology of peritoneum was discussed.  Natural history risks without surgery of worsening renal failure was discussed.   I feel the risks of no intervention will lead to serious problems that outweigh the operative risks; therefore, I recommended placement of a peritoneal dialysis catheter.  I explained laparoscopic techniques with possible need for an open approach.    Risks such as bleeding, infection, abscess, injury to other organs, catheter occlusion or malpositioning, reoperation to remove/reposition the catheter, heart attack, death, and other risks were discussed.   I noted a good likelihood this will help address the problem.  Possibility that this will not be enough to compensate for the renal failure & need for further treatment such as hemodialysis was explained.  Goals of post-operative recovery were discussed as well.  We will work to minimize complications.   The patient is/will be getting training on catheter use by dialysis nursing before and after surgery.  I stressed the importance of meticulous care & sterile technique to prevent catheter problems.  Questions were answered.  The patient expresses understanding & wishes to proceed with surgery.

## 2013-01-14 NOTE — Anesthesia Preprocedure Evaluation (Addendum)
Anesthesia Evaluation  Patient identified by MRN, date of birth, ID band Patient awake    Reviewed: Allergy & Precautions, H&P , NPO status , Patient's Chart, lab work & pertinent test results  History of Anesthesia Complications Negative for: history of anesthetic complications  Airway Mallampati: II TM Distance: >3 FB Neck ROM: full    Dental  (+) Teeth Intact and Dental Advisory Given   Pulmonary shortness of breath,  breath sounds clear to auscultation        Cardiovascular hypertension, Rhythm:Regular Rate:Normal     Neuro/Psych PSYCHIATRIC DISORDERS Depression negative neurological ROS     GI/Hepatic GERD-  Medicated and Controlled,  Endo/Other  diabetes, Type 2, Insulin DependentHD evening 8/28  Renal/GU ESRF and DialysisRenal disease     Musculoskeletal negative musculoskeletal ROS (+)   Abdominal   Peds  Hematology negative hematology ROS (+)   Anesthesia Other Findings   Reproductive/Obstetrics negative OB ROS                          Anesthesia Physical Anesthesia Plan  ASA: III  Anesthesia Plan: General   Post-op Pain Management:    Induction: Intravenous  Airway Management Planned: Oral ETT  Additional Equipment:   Intra-op Plan:   Post-operative Plan: Extubation in OR  Informed Consent: I have reviewed the patients History and Physical, chart, labs and discussed the procedure including the risks, benefits and alternatives for the proposed anesthesia with the patient or authorized representative who has indicated his/her understanding and acceptance.     Plan Discussed with: CRNA, Anesthesiologist and Surgeon  Anesthesia Plan Comments:         Anesthesia Quick Evaluation

## 2013-01-15 ENCOUNTER — Encounter (HOSPITAL_COMMUNITY): Payer: Self-pay | Admitting: Surgery

## 2013-01-18 NOTE — Anesthesia Postprocedure Evaluation (Signed)
Anesthesia Post Note  Patient: Paul [name]  Procedure(s) Performed: Procedure(s) (LRB): LAPAROSCOPIC INSERTION CONTINUOUS AMBULATORY PERITONEAL DIALYSIS  (CAPD) CATHETER (N/A)  Anesthesia type: General  Patient location: PACU  Post pain: Pain level controlled and Adequate analgesia  Post assessment: Post-op Vital signs reviewed, Patient's Cardiovascular Status Stable, Respiratory Function Stable, Patent Airway and Pain level controlled  Last Vitals:  Filed Vitals:   01/14/13 1614  BP: 143/64  Pulse: 94  Temp:   Resp: 15    Post vital signs: Reviewed and stable  Level of consciousness: awake, alert  and oriented  Complications: No apparent anesthesia complications

## 2013-01-27 ENCOUNTER — Encounter (INDEPENDENT_AMBULATORY_CARE_PROVIDER_SITE_OTHER): Payer: Managed Care, Other (non HMO) | Admitting: Surgery

## 2013-03-30 ENCOUNTER — Other Ambulatory Visit: Payer: Self-pay | Admitting: Dermatology

## 2013-04-02 ENCOUNTER — Encounter (HOSPITAL_COMMUNITY): Payer: Self-pay | Admitting: Emergency Medicine

## 2013-04-02 ENCOUNTER — Emergency Department (HOSPITAL_COMMUNITY): Payer: Managed Care, Other (non HMO)

## 2013-04-02 ENCOUNTER — Inpatient Hospital Stay (HOSPITAL_COMMUNITY)
Admission: EM | Admit: 2013-04-02 | Discharge: 2013-04-04 | DRG: 308 | Disposition: A | Discharge status: Home / Self Care | Payer: Managed Care, Other (non HMO) | Attending: Internal Medicine | Admitting: Internal Medicine

## 2013-04-02 DIAGNOSIS — I4891 Unspecified atrial fibrillation: Principal | ICD-10-CM

## 2013-04-02 DIAGNOSIS — Z7982 Long term (current) use of aspirin: Secondary | ICD-10-CM

## 2013-04-02 DIAGNOSIS — E876 Hypokalemia: Secondary | ICD-10-CM | POA: Diagnosis present

## 2013-04-02 DIAGNOSIS — I959 Hypotension, unspecified: Secondary | ICD-10-CM

## 2013-04-02 DIAGNOSIS — N186 End stage renal disease: Secondary | ICD-10-CM

## 2013-04-02 DIAGNOSIS — I517 Cardiomegaly: Secondary | ICD-10-CM

## 2013-04-02 DIAGNOSIS — Z794 Long term (current) use of insulin: Secondary | ICD-10-CM

## 2013-04-02 DIAGNOSIS — E1149 Type 2 diabetes mellitus with other diabetic neurological complication: Secondary | ICD-10-CM | POA: Diagnosis present

## 2013-04-02 DIAGNOSIS — F329 Major depressive disorder, single episode, unspecified: Secondary | ICD-10-CM

## 2013-04-02 DIAGNOSIS — F3289 Other specified depressive episodes: Secondary | ICD-10-CM | POA: Diagnosis present

## 2013-04-02 DIAGNOSIS — T82898A Other specified complication of vascular prosthetic devices, implants and grafts, initial encounter: Secondary | ICD-10-CM

## 2013-04-02 DIAGNOSIS — K219 Gastro-esophageal reflux disease without esophagitis: Secondary | ICD-10-CM

## 2013-04-02 DIAGNOSIS — Z992 Dependence on renal dialysis: Secondary | ICD-10-CM

## 2013-04-02 DIAGNOSIS — E1142 Type 2 diabetes mellitus with diabetic polyneuropathy: Secondary | ICD-10-CM | POA: Diagnosis present

## 2013-04-02 DIAGNOSIS — D649 Anemia, unspecified: Secondary | ICD-10-CM

## 2013-04-02 DIAGNOSIS — E119 Type 2 diabetes mellitus without complications: Secondary | ICD-10-CM

## 2013-04-02 DIAGNOSIS — R6510 Systemic inflammatory response syndrome (SIRS) of non-infectious origin without acute organ dysfunction: Secondary | ICD-10-CM

## 2013-04-02 DIAGNOSIS — F32A Depression, unspecified: Secondary | ICD-10-CM

## 2013-04-02 DIAGNOSIS — I120 Hypertensive chronic kidney disease with stage 5 chronic kidney disease or end stage renal disease: Secondary | ICD-10-CM | POA: Diagnosis present

## 2013-04-02 DIAGNOSIS — Z8509 Personal history of malignant neoplasm of other digestive organs: Secondary | ICD-10-CM

## 2013-04-02 DIAGNOSIS — Z7682 Awaiting organ transplant status: Secondary | ICD-10-CM

## 2013-04-02 LAB — CG4 I-STAT (LACTIC ACID): Lactic Acid, Venous: 2.26 mmol/L — ABNORMAL HIGH (ref 0.5–2.2)

## 2013-04-02 LAB — PROTEIN, BODY FLUID: Total protein, fluid: <0.2 g/dL

## 2013-04-02 LAB — TROPONIN I: Troponin I: <0.3 ng/mL (ref <0.30)

## 2013-04-02 LAB — CBC WITH DIFFERENTIAL/PLATELET
Basophils Absolute: 0 10*3/uL (ref 0.0–0.1)
Lymphs Abs: 0.8 10*3/uL (ref 0.7–4.0)
MCH: 30.6 pg (ref 26.0–34.0)
MCV: 89.9 fL (ref 78.0–100.0)
Monocytes Absolute: 1.4 10*3/uL — ABNORMAL HIGH (ref 0.1–1.0)
Monocytes Relative: 7 % (ref 3–12)
Neutrophils Relative %: 88 % — ABNORMAL HIGH (ref 43–77)
Platelets: 200 10*3/uL (ref 150–400)
RDW: 12.5 % (ref 11.5–15.5)
WBC: 19.4 10*3/uL — ABNORMAL HIGH (ref 4.0–10.5)

## 2013-04-02 LAB — LACTATE DEHYDROGENASE, PLEURAL OR PERITONEAL FLUID: LD, Fluid: <10 U/L

## 2013-04-02 LAB — COMPREHENSIVE METABOLIC PANEL
ALT: 10 U/L (ref 0–53)
AST: 11 U/L (ref 0–37)
Calcium: 7.2 mg/dL — ABNORMAL LOW (ref 8.4–10.5)
Potassium: 3.3 mEq/L — ABNORMAL LOW (ref 3.5–5.1)
Sodium: 138 mEq/L (ref 135–145)
Total Protein: 5.8 g/dL — ABNORMAL LOW (ref 6.0–8.3)

## 2013-04-02 LAB — BODY FLUID CELL COUNT WITH DIFFERENTIAL: Total Nucleated Cell Count, Fluid: 3 cu mm (ref 0–1000)

## 2013-04-02 LAB — POCT I-STAT TROPONIN I: Troponin i, poc: 0.11 ng/mL (ref 0.00–0.08)

## 2013-04-02 LAB — GLUCOSE, CAPILLARY
Glucose-Capillary: 147 mg/dL — ABNORMAL HIGH (ref 70–99)
Glucose-Capillary: 158 mg/dL — ABNORMAL HIGH (ref 70–99)
Glucose-Capillary: 126 mg/dL — ABNORMAL HIGH (ref 70–99)

## 2013-04-02 LAB — MRSA PCR SCREENING: MRSA by PCR: NEGATIVE

## 2013-04-02 LAB — PRO B NATRIURETIC PEPTIDE: Pro B Natriuretic peptide (BNP): 12548 pg/mL — ABNORMAL HIGH (ref 0–125)

## 2013-04-02 MED ORDER — SODIUM CHLORIDE 0.9 % IJ SOLN
3.0000 mL | Freq: Two times a day (BID) | INTRAMUSCULAR | Status: DC
  Administered 2013-04-02 – 2013-04-03 (×3): 3 mL via INTRAVENOUS

## 2013-04-02 MED ORDER — DILTIAZEM HCL 25 MG/5ML IV SOLN
15.0000 mg | Freq: Once | INTRAVENOUS | Status: AC
  Administered 2013-04-02: 15 mg via INTRAVENOUS
  Filled 2013-04-02: qty 5

## 2013-04-02 MED ORDER — SODIUM CHLORIDE 0.9 % IJ SOLN: 3.0000 mL | INTRAMUSCULAR | Status: DC | PRN

## 2013-04-02 MED ORDER — PANTOPRAZOLE SODIUM 40 MG PO TBEC
40.0000 mg | DELAYED_RELEASE_TABLET | Freq: Every day | ORAL | Status: DC
  Administered 2013-04-03 – 2013-04-04 (×2): 40 mg via ORAL
  Filled 2013-04-02 (×2): qty 1

## 2013-04-02 MED ORDER — METOCLOPRAMIDE HCL 5 MG PO TABS
5.0000 mg | ORAL_TABLET | Freq: Three times a day (TID) | ORAL | Status: DC
  Administered 2013-04-02 – 2013-04-04 (×8): 5 mg via ORAL
  Filled 2013-04-02 (×11): qty 1

## 2013-04-02 MED ORDER — WARFARIN - PHARMACIST DOSING INPATIENT
Freq: Every day | Status: DC
  Administered 2013-04-02: 18:00:00

## 2013-04-02 MED ORDER — DELFLEX-LC/1.5% DEXTROSE 346 MOSM/L IP SOLN: Freq: Once | INTRAPERITONEAL | Status: DC

## 2013-04-02 MED ORDER — WARFARIN SODIUM 5 MG PO TABS
5.0000 mg | ORAL_TABLET | Freq: Once | ORAL | Status: AC
  Administered 2013-04-02: 5 mg via ORAL
  Filled 2013-04-02: qty 1

## 2013-04-02 MED ORDER — SODIUM CHLORIDE 0.9 % IV SOLN: 3.0000 g | INTRAVENOUS | Status: DC

## 2013-04-02 MED ORDER — VANCOMYCIN HCL 10 G IV SOLR
2400.0000 mg | Freq: Once | INTRAVENOUS | Status: AC
  Administered 2013-04-02: 2400 mg via INTRAVENOUS
  Filled 2013-04-02: qty 2400

## 2013-04-02 MED ORDER — DILTIAZEM HCL 100 MG IV SOLR
5.0000 mg/h | INTRAVENOUS | Status: DC
  Administered 2013-04-02: 5 mg/h via INTRAVENOUS
  Filled 2013-04-02: qty 100

## 2013-04-02 MED ORDER — ATORVASTATIN CALCIUM 10 MG PO TABS
10.0000 mg | ORAL_TABLET | Freq: Every day | ORAL | Status: DC
  Administered 2013-04-03 – 2013-04-04 (×2): 10 mg via ORAL
  Filled 2013-04-02 (×2): qty 1

## 2013-04-02 MED ORDER — CALCIUM ACETATE 667 MG PO CAPS
1334.0000 mg | ORAL_CAPSULE | Freq: Three times a day (TID) | ORAL | Status: DC
  Administered 2013-04-02 – 2013-04-04 (×6): 1334 mg via ORAL
  Filled 2013-04-02 (×7): qty 2

## 2013-04-02 MED ORDER — INSULIN GLARGINE 100 UNIT/ML ~~LOC~~ SOLN
16.0000 [IU] | Freq: Every day | SUBCUTANEOUS | Status: DC
  Filled 2013-04-02: qty 0.16

## 2013-04-02 MED ORDER — HEPARIN SODIUM (PORCINE) 5000 UNIT/ML IJ SOLN
5000.0000 [IU] | Freq: Three times a day (TID) | INTRAMUSCULAR | Status: DC
  Administered 2013-04-02 – 2013-04-04 (×5): 5000 [IU] via SUBCUTANEOUS
  Filled 2013-04-02 (×8): qty 1

## 2013-04-02 MED ORDER — INSULIN GLARGINE 100 UNIT/ML ~~LOC~~ SOLN
16.0000 [IU] | Freq: Every day | SUBCUTANEOUS | Status: DC
  Administered 2013-04-04: 16 [IU] via SUBCUTANEOUS
  Filled 2013-04-02 (×3): qty 0.16

## 2013-04-02 MED ORDER — INSULIN ASPART 100 UNIT/ML ~~LOC~~ SOLN
0.0000 [IU] | Freq: Three times a day (TID) | SUBCUTANEOUS | Status: DC
  Administered 2013-04-02 – 2013-04-03 (×2): 2 [IU] via SUBCUTANEOUS
  Administered 2013-04-03: 1 [IU] via SUBCUTANEOUS
  Administered 2013-04-03: 2 [IU] via SUBCUTANEOUS
  Administered 2013-04-04 (×2): 1 [IU] via SUBCUTANEOUS

## 2013-04-02 MED ORDER — WARFARIN VIDEO: Freq: Once | Status: DC

## 2013-04-02 MED ORDER — DELFLEX-LC/1.5% DEXTROSE 346 MOSM/L IP SOLN
INTRAPERITONEAL | Status: DC
Start: 2013-04-02 — End: 2013-04-04

## 2013-04-02 MED ORDER — CALCIUM ACETATE 667 MG PO CAPS
667.0000 mg | ORAL_CAPSULE | Freq: Two times a day (BID) | ORAL | Status: DC
  Filled 2013-04-02 (×2): qty 1

## 2013-04-02 MED ORDER — DELFLEX-LC/1.5% DEXTROSE 346 MOSM/L IP SOLN: INTRAPERITONEAL | Status: DC

## 2013-04-02 MED ORDER — ASPIRIN EC 81 MG PO TBEC
81.0000 mg | DELAYED_RELEASE_TABLET | Freq: Every day | ORAL | Status: DC
  Filled 2013-04-02: qty 1

## 2013-04-02 MED ORDER — DOCUSATE SODIUM 100 MG PO CAPS
100.0000 mg | ORAL_CAPSULE | Freq: Two times a day (BID) | ORAL | Status: DC
  Administered 2013-04-02 – 2013-04-04 (×4): 100 mg via ORAL
  Filled 2013-04-02 (×5): qty 1

## 2013-04-02 MED ORDER — ONDANSETRON HCL 4 MG PO TABS: 4.0000 mg | ORAL_TABLET | Freq: Four times a day (QID) | ORAL | Status: DC | PRN

## 2013-04-02 MED ORDER — DEXTROSE 5 % IV SOLN
2.0000 g | INTRAVENOUS | Status: DC
  Administered 2013-04-02: 2 g via INTRAVENOUS
  Filled 2013-04-02: qty 2

## 2013-04-02 MED ORDER — SODIUM CHLORIDE 0.9 % IJ SOLN
3.0000 mL | Freq: Two times a day (BID) | INTRAMUSCULAR | Status: DC
  Administered 2013-04-03 – 2013-04-04 (×3): 3 mL via INTRAVENOUS

## 2013-04-02 MED ORDER — POTASSIUM CHLORIDE CRYS ER 20 MEQ PO TBCR
20.0000 meq | EXTENDED_RELEASE_TABLET | Freq: Two times a day (BID) | ORAL | Status: DC
  Administered 2013-04-03 – 2013-04-04 (×2): 20 meq via ORAL
  Filled 2013-04-02 (×4): qty 1

## 2013-04-02 MED ORDER — SERTRALINE HCL 50 MG PO TABS
50.0000 mg | ORAL_TABLET | Freq: Every day | ORAL | Status: DC
  Administered 2013-04-03 – 2013-04-04 (×2): 50 mg via ORAL
  Filled 2013-04-02 (×2): qty 1

## 2013-04-02 MED ORDER — SODIUM CHLORIDE 0.9 % IV SOLN: 250.0000 mL | INTRAVENOUS | Status: DC | PRN

## 2013-04-02 MED ORDER — COUMADIN BOOK
Freq: Once | Status: AC
  Administered 2013-04-02: 18:00:00
  Filled 2013-04-02: qty 1

## 2013-04-02 MED ORDER — PROMETHAZINE HCL 25 MG PO TABS: 25.0000 mg | ORAL_TABLET | Freq: Four times a day (QID) | ORAL | Status: DC | PRN

## 2013-04-02 MED ORDER — CALCIUM ACETATE 667 MG PO CAPS: 1334.0000 mg | ORAL_CAPSULE | Freq: Three times a day (TID) | ORAL | Status: DC

## 2013-04-02 MED ORDER — MORPHINE SULFATE 2 MG/ML IJ SOLN: 1.0000 mg | INTRAMUSCULAR | Status: DC | PRN

## 2013-04-02 MED ORDER — ONDANSETRON HCL 4 MG/2ML IJ SOLN: 4.0000 mg | Freq: Four times a day (QID) | INTRAMUSCULAR | Status: DC | PRN

## 2013-04-02 MED ORDER — CALCITRIOL 0.25 MCG PO CAPS
0.2500 ug | ORAL_CAPSULE | Freq: Every day | ORAL | Status: DC
  Administered 2013-04-03 – 2013-04-04 (×2): 0.25 ug via ORAL
  Filled 2013-04-02 (×2): qty 1

## 2013-04-02 NOTE — ED Notes (Signed)
Pt reports feeling dizzy, lightheaded, nausea, vomiting, and low blood pressure readings at home x5 days. Symptoms started Monday subsided then returned on Tuesday. Pt is currently on the kidney transplant list. Pt is cool to touch, pale coloring

## 2013-04-02 NOTE — ED Notes (Signed)
Notified Allyson RN on 6700 that pt needs fluid pulled from Peritoneal dialysis catheter, will be down shortly

## 2013-04-02 NOTE — Progress Notes (Addendum)
ANTIBIOTIC CONSULT NOTE - INITIAL  Pharmacy Consult for Vanco/Cefepime Indication: Intra-abdominal infection  Allergies  Allergen Reactions  . Metformin And Related Other (See Comments)    Kidney problems    Patient Measurements: Height: 6\' 2"  (188 cm) Weight: 200 lb (90.719 kg) IBW/kg (Calculated) : 82.2 Adjusted Body Weight:    Vital Signs: Temp: 97.7 F (36.5 C) (11/14 1133) Temp src: Oral (11/14 1133) BP: 114/62 mmHg (11/14 1415) Pulse Rate: 73 (11/14 1415) Intake/Output from previous day:   Intake/Output from this shift:    Labs:  Recent Labs  04/02/13 1201  WBC 19.4*  HGB 10.0*  PLT 200  CREATININE 9.62*   Estimated Creatinine Clearance: 9.6 ml/min (by C-G formula based on Cr of 9.62). No results found for this basename: VANCOTROUGH, VANCOPEAK, VANCORANDOM, GENTTROUGH, GENTPEAK, GENTRANDOM, TOBRATROUGH, TOBRAPEAK, TOBRARND, AMIKACINPEAK, AMIKACINTROU, AMIKACIN,  in the last 72 hours   Microbiology: No results found for this or any previous visit (from the past 720 hour(s)).  Medical History: Past Medical History  Diagnosis Date  . Diabetic neuropathy   . Cancer     History of Duodenal Cancer  . Chronic kidney disease     Pt is not eligible for transplant per [facility]  . Diabetes mellitus     Type 2 iddm x 11 yrs  . Anemia     esrd  . Hypertension     meds d/c 'd  2 wks ago for postural hypotension  . Fatigue      tires easily x 2 months  . Chronic nausea     with  morning vomiting; better off  HTN meds  . Intermittent tremor   . Closed left arm fracture 1967  . Heart murmur   . Corneal abrasion, left   . Shortness of breath     exertional  . GERD (gastroesophageal reflux disease)   . History of blood transfusion   . Lack of coordination   . Muscle weakness (generalized)   . Glomerulosclerosis, diabetic   . Depression     Medications: med rec pending  Assessment: dizziness, lightheaded, N/V, hypotension 57 y/o M on peritoneal dialysis  presents with above complaints x 5 days. 8lb weight gain in the last 2 weeks. Noted complex PMH.  WBC elevated at 19.4. Scr 9.62. Ca+ 7.2. Albumin only 2.4. Troponin 0.11, LA 2.26 slightly elevated, proBNP 12,548.  Goal of Therapy:  Treatment of intra-abdominal infection  Plan:  1. Vancomycin 2400mg  IV x 1. Obtain level in 3-4 days and redose when level<20 2. Cefepime 2g IV q48h Please inform if nephrology would prefer this given intra-peritoneally.  Angello Chien S. [name], PharmD, BCPS Clinical Staff Pharmacist Pager [phone]  Eilene Ghazi Stillinger 04/02/2013,2:35 PM

## 2013-04-02 NOTE — Progress Notes (Signed)
  Echocardiogram 2D Echocardiogram has been performed.  Mauricio Po 04/02/2013, 6:07 PM

## 2013-04-02 NOTE — ED Provider Notes (Signed)
CSN: [id number]     Arrival date & time 04/02/13  1124 History   First MD Initiated Contact with Patient 04/02/13 1137     Chief Complaint  Patient presents with  . Dizziness   (Consider location/radiation/quality/duration/timing/severity/associated sxs/prior Treatment) HPI Comments: For the last 5 days patient has had intermittent symptoms of feeling lightheaded, nauseated, vomiting and low blood pressure. He is on peritoneal dialysis and has been doing his normal exchanges without any change in the color of the diasalyte.  He states that symptoms are worse with standing but have not improved. He denies palpitations, chest pain or shortness of breath. When he's checked his blood pressure the last few days has been low but he has not remembered that the heart rate was. He denies fever, abdominal pain, headache. She has noted 8 pound weight gain in the last 2 weeks.  The history is provided by the patient.    Past Medical History  Diagnosis Date  . Diabetic neuropathy   . Cancer     History of Duodenal Cancer  . Chronic kidney disease     Pt is not eligible for transplant per [facility]  . Diabetes mellitus     Type 2 iddm x 11 yrs  . Anemia     esrd  . Hypertension     meds d/c 'd  2 wks ago for postural hypotension  . Fatigue      tires easily x 2 months  . Chronic nausea     with  morning vomiting; better off  HTN meds  . Intermittent tremor   . Closed left arm fracture 1967  . Heart murmur   . Corneal abrasion, left   . Shortness of breath     exertional  . GERD (gastroesophageal reflux disease)   . History of blood transfusion   . Lack of coordination   . Muscle weakness (generalized)   . Glomerulosclerosis, diabetic   . Depression    Past Surgical History  Procedure Laterality Date  . Whipple procedure  2002    duodenal ca  . Bone marrow biopsy  2012  . Renal biopsy, percutaneous  2012  . Tonsillectomy    . Portacath placement  2002  . Biliary diversion, external   2002  . Other surgical history      Cyst removed from back  . Av fistula placement  06/07/2011    Procedure: ARTERIOVENOUS (AV) FISTULA CREATION;  Surgeon: Angelia Mould, MD;  Location: [place];  Service: Vascular;  Laterality: Left;  . Ligation of arteriovenous  fistula  05/22/2012    Procedure: LIGATION OF ARTERIOVENOUS  FISTULA;  Surgeon: Mal Misty, MD;  Location: [place];  Service: Vascular;  Laterality: Left;  Left brachio-cephalic fistula  . Insertion of dialysis catheter  05/22/2012    Procedure: INSERTION OF DIALYSIS CATHETER;  Surgeon: Mal Misty, MD;  Location: [place];  Service: Vascular;  Laterality: N/A;  right internal jugular vein  . Av fistula placement  06/18/2012    Procedure: ARTERIOVENOUS (AV) FISTULA CREATION;  Surgeon: Mal Misty, MD;  Location: [place];  Service: Vascular;  Laterality: Right;  [place]  . Colonoscopy      Hx: of  . Capd insertion N/A 01/14/2013    Procedure: LAPAROSCOPIC INSERTION CONTINUOUS AMBULATORY PERITONEAL DIALYSIS  (CAPD) CATHETER;  Surgeon: Adin Hector, MD;  Location: [place] [facility];  Service: General;  Laterality: N/A;   Family History  Problem Relation Age of Onset  . Cancer Mother   .  Cancer Father     prostate cancer  . Heart disease Maternal Grandfather   . Stroke Paternal Grandmother    History  Substance Use Topics  . Smoking status: Never Smoker   . Smokeless tobacco: Never Used  . Alcohol Use: No    Review of Systems  Constitutional: Negative for fever.  Respiratory: Negative for cough and shortness of breath.   Cardiovascular: Positive for leg swelling. Negative for chest pain and palpitations.  Gastrointestinal: Positive for nausea and vomiting. Negative for abdominal pain.  All other systems reviewed and are negative.    Allergies  Metformin and related  Home Medications   Current Outpatient Rx  Name  Route  Sig  Dispense  Refill  . aspirin EC 81 MG tablet   Oral   Take 81 mg by mouth daily.         [name]  atorvastatin (LIPITOR) 10 MG tablet   Oral   Take 10 mg by mouth daily.          . calcitRIOL (ROCALTROL) 0.25 MCG capsule   Oral   Take 0.25 mcg by mouth daily.          . calcium acetate (PHOSLO) 667 MG capsule   Oral   Take 667 mg by mouth 2 (two) times daily.          . Cholecalciferol (VITAMIN D PO)   Oral   Take 1 capsule by mouth daily.         [name] Epoetin Alfa (EPOGEN IJ)   Injection   Inject as directed 3 (three) times a week. Monday, Wednesday, friday         . insulin glargine (LANTUS) 100 UNIT/ML injection   Subcutaneous   Inject 20 Units into the skin every morning.          . metoCLOPramide (REGLAN) 5 MG tablet   Oral   Take 5 mg by mouth 4 (four) times daily -  with meals and at bedtime.          [name] omeprazole (PRILOSEC) 40 MG capsule   Oral   Take 40 mg by mouth daily.         . ONE TOUCH ULTRA TEST test strip               . oxyCODONE (OXY IR/ROXICODONE) 5 MG immediate release tablet   Oral   Take 1-2 tablets (5-10 mg total) by mouth every 6 (six) hours as needed for pain.   30 tablet   0   . Potassium Chloride (K-LOR PO)   Oral   Take 20 mEq by mouth daily.          . promethazine (PHENERGAN) 25 MG tablet   Oral   Take 25 mg by mouth every 6 (six) hours as needed for nausea.         . sertraline (ZOLOFT) 50 MG tablet   Oral   Take 50 mg by mouth daily.           BP 106/83  Pulse 78  Temp(Src) 97.7 F (36.5 C) (Oral)  Resp 13  Ht 6\' 2"  (1.88 m)  Wt 200 lb (90.719 kg)  BMI 25.67 kg/m2  SpO2 100% Physical Exam  Nursing note and vitals reviewed. Constitutional: He is oriented to person, place, and time. He appears well-developed and well-nourished. No distress.  HENT:  Head: Normocephalic and atraumatic.  Mouth/Throat: Oropharynx is clear and moist.  Eyes: Conjunctivae and EOM are normal. Pupils  are equal, round, and reactive to light.  Neck: Normal range of motion. Neck supple.  Cardiovascular: Intact distal  pulses.  An irregularly irregular rhythm present. Tachycardia present.   No murmur heard. Pulmonary/Chest: Effort normal and breath sounds normal. No respiratory distress. He has no wheezes. He has no rales.  Abdominal: Soft. He exhibits no distension. There is no tenderness. There is no rebound and no guarding.  Dialysis catheter present without drainage or pus at site  Musculoskeletal: Normal range of motion. He exhibits edema. He exhibits no tenderness.  1+ pitting edema  Neurological: He is alert and oriented to person, place, and time.  Skin: Skin is dry. No rash noted. No erythema. There is pallor.  Cool, gray in color  Psychiatric: He has a normal mood and affect. His behavior is normal.    ED Course  Procedures (including critical care time) Labs Review Labs Reviewed  CBC WITH DIFFERENTIAL - Abnormal; Notable for the following:    WBC 19.4 (*)    RBC 3.27 (*)    Hemoglobin 10.0 (*)    HCT 29.4 (*)    Neutrophils Relative % 88 (*)    Lymphocytes Relative 4 (*)    Neutro Abs 17.0 (*)    Monocytes Absolute 1.4 (*)    All other components within normal limits  COMPREHENSIVE METABOLIC PANEL - Abnormal; Notable for the following:    Potassium 3.3 (*)    CO2 18 (*)    Glucose, Bld 158 (*)    BUN 86 (*)    Creatinine, Ser 9.62 (*)    Calcium 7.2 (*)    Total Protein 5.8 (*)    Albumin 2.4 (*)    GFR calc non Af Amer 5 (*)    GFR calc Af Amer 6 (*)    All other components within normal limits  GLUCOSE, CAPILLARY - Abnormal; Notable for the following:    Glucose-Capillary 156 (*)    All other components within normal limits  PRO B NATRIURETIC PEPTIDE - Abnormal; Notable for the following:    Pro B Natriuretic peptide (BNP) 12548.0 (*)    All other components within normal limits  CG4 I-STAT (LACTIC ACID) - Abnormal; Notable for the following:    Lactic Acid, Venous 2.26 (*)    All other components within normal limits  POCT I-STAT TROPONIN I - Abnormal; Notable for the  following:    Troponin i, poc 0.11 (*)    All other components within normal limits  BODY FLUID CULTURE  LACTATE DEHYDROGENASE, BODY FLUID  GLUCOSE, PERITONEAL FLUID  PROTEIN, BODY FLUID  ALBUMIN, FLUID   Imaging Review No results found.  EKG Interpretation     Ventricular Rate:  144 PR Interval:    QRS Duration: 76 QT Interval:  304 QTC Calculation: 470 R Axis:     Text Interpretation:  new Atrial flutter with predominant 2:1 AV block Anteroseptal infarct, old Borderline ST depression, lateral leads            MDM   1. Atrial fibrillation with RVR   2. Hypotensive episode     Patient presents with complaints of lightheadedness, near syncope, nausea vomiting and low blood pressure that has been intermittent for the last 5 days. He does do peritoneal dialysis and is on the kidney transplant list. Recent change in his diasolate no change in exchange.  He denies abdominal pain or fevers. On exam patient is tachycardic with irregular rate an EKG reveals atrial flutter with 2:1 AV  block. Patient has never had atrial fibrillation to his knowledge. That the atrial fibrillation could be the cause of all of his symptoms versus a result from an underlying infection.  1:16 PM Labs indicate a mild troponin leak, lactate of 2.2, leukocytosis of 19,000 and from unknown cause. Stable hemoglobin and potassium of 3.3. Improvement in heart rate with Cardizem bolus 15 mg. Heart rate is now between 80 and 110. We'll continue Cardizem drip. BNP is elevated and less likely a result of recent atrial fibrillation with RVR. Chest x-ray pending. Peritoneal fluid drawn off for analysis.  Admit for further care.  CRITICAL CARE Performed by: Blanchie Dessert Total critical care time: 30 Critical care time was exclusive of separately billable procedures and treating other patients. Critical care was necessary to treat or prevent imminent or life-threatening deterioration. Critical care was time  spent personally by me on the following activities: development of treatment plan with patient and/or surrogate as well as nursing, discussions with consultants, evaluation of patient's response to treatment, examination of patient, obtaining history from patient or surrogate, ordering and performing treatments and interventions, ordering and review of laboratory studies, ordering and review of radiographic studies, pulse oximetry and re-evaluation of patient's condition.   Blanchie Dessert, MD 04/02/13 1400

## 2013-04-02 NOTE — ED Notes (Signed)
Allyson RN at bedside to draw peritoneal fluid

## 2013-04-02 NOTE — H&P (Signed)
[facility] History and Physical  Paul [name] [id number] DOB: [birth year] DOA: 04/02/2013  Referring physician: Maryan Rued PCP: [name], MD  Specialists: Dr Lorrene Reid.   Chief Complaint: weakness, dizziness.   HPI: Paul [name] is a 57 y.o. male with PMH significant for Diabetes, ESRD on peritoneal dialysis, history of duodenal cancer who presents to the ED complaining of dizziness, weakness that started last Monday. He relates weakness, dizziness got worse today. His BP at home was also low. He has notices palpitation. He denies fever, cough, diarrhea, abdominal pain, chest pain or dyspnea. He does relates nausea and occasional vomiting since he has had renal failure. He does not appears to be toxic.  He was found to be in A fib RVR, Hr in the 140 range, SBP in the 70. He received 15 mg of  of IV Cardizem. His HR rate is now in the 70, sinus.    Review of Systems: negative except as per HPI.   Past Medical History  Diagnosis Date  . Diabetic neuropathy   . Cancer     History of Duodenal Cancer  . Chronic kidney disease     Pt is not eligible for transplant per [facility]  . Diabetes mellitus     Type 2 iddm x 11 yrs  . Anemia     esrd  . Hypertension     meds d/c 'd  2 wks ago for postural hypotension  . Fatigue      tires easily x 2 months  . Chronic nausea     with  morning vomiting; better off  HTN meds  . Intermittent tremor   . Closed left arm fracture 1967  . Heart murmur   . Corneal abrasion, left   . Shortness of breath     exertional  . GERD (gastroesophageal reflux disease)   . History of blood transfusion   . Lack of coordination   . Muscle weakness (generalized)   . Glomerulosclerosis, diabetic   . Depression    Past Surgical History  Procedure Laterality Date  . Whipple procedure  2002    duodenal ca  . Bone marrow biopsy  2012  . Renal biopsy, percutaneous  2012  . Tonsillectomy    . Portacath placement  2002  . Biliary diversion, external   2002  . Other surgical history      Cyst removed from back  . Av fistula placement  06/07/2011    Procedure: ARTERIOVENOUS (AV) FISTULA CREATION;  Surgeon: Angelia Mould, MD;  Location: [place];  Service: Vascular;  Laterality: Left;  . Ligation of arteriovenous  fistula  05/22/2012    Procedure: LIGATION OF ARTERIOVENOUS  FISTULA;  Surgeon: Mal Misty, MD;  Location: [place];  Service: Vascular;  Laterality: Left;  Left brachio-cephalic fistula  . Insertion of dialysis catheter  05/22/2012    Procedure: INSERTION OF DIALYSIS CATHETER;  Surgeon: Mal Misty, MD;  Location: [place];  Service: Vascular;  Laterality: N/A;  right internal jugular vein  . Av fistula placement  06/18/2012    Procedure: ARTERIOVENOUS (AV) FISTULA CREATION;  Surgeon: Mal Misty, MD;  Location: [place];  Service: Vascular;  Laterality: Right;  [place]  . Colonoscopy      Hx: of  . Capd insertion N/A 01/14/2013    Procedure: LAPAROSCOPIC INSERTION CONTINUOUS AMBULATORY PERITONEAL DIALYSIS  (CAPD) CATHETER;  Surgeon: Adin Hector, MD;  Location: [place];  Service: General;  Laterality: N/A;   Social History:  reports  that he has never smoked. He has never used smokeless tobacco. He reports that he does not drink alcohol or use illicit drugs.   Allergies  Allergen Reactions  . Metformin And Related Other (See Comments)    Kidney problems    Family History  Problem Relation Age of Onset  . Cancer Mother   . Cancer Father     prostate cancer  . Heart disease Maternal Grandfather   . Stroke Paternal Grandmother     Prior to Admission medications   Medication Sig Start Date End Date Taking? Authorizing Provider  aspirin EC 81 MG tablet Take 81 mg by mouth daily.    Historical Provider, MD  atorvastatin (LIPITOR) 10 MG tablet Take 10 mg by mouth daily.     Historical Provider, MD  calcitRIOL (ROCALTROL) 0.25 MCG capsule Take 0.25 mcg by mouth daily.     Historical Provider, MD  calcium acetate (PHOSLO) 667  MG capsule Take 667 mg by mouth 2 (two) times daily.  06/07/11   Historical Provider, MD  Cholecalciferol (VITAMIN D PO) Take 1 capsule by mouth daily.    Historical Provider, MD  Epoetin Alfa (EPOGEN IJ) Inject as directed 3 (three) times a week. Monday, Wednesday, friday    Historical Provider, MD  insulin glargine (LANTUS) 100 UNIT/ML injection Inject 20 Units into the skin every morning.     Historical Provider, MD  metoCLOPramide (REGLAN) 5 MG tablet Take 5 mg by mouth 4 (four) times daily -  with meals and at bedtime.     Historical Provider, MD  omeprazole (PRILOSEC) 40 MG capsule Take 40 mg by mouth daily.    Historical Provider, MD  ONE TOUCH ULTRA TEST test strip  04/21/12   Historical Provider, MD  oxyCODONE (OXY IR/ROXICODONE) 5 MG immediate release tablet Take 1-2 tablets (5-10 mg total) by mouth every 6 (six) hours as needed for pain. 01/14/13   Adin Hector, MD  Potassium Chloride (K-LOR PO) Take 20 mEq by mouth daily.     Historical Provider, MD  promethazine (PHENERGAN) 25 MG tablet Take 25 mg by mouth every 6 (six) hours as needed for nausea.    Historical Provider, MD  sertraline (ZOLOFT) 50 MG tablet Take 50 mg by mouth daily.  12/08/12   Historical Provider, MD   Physical Exam: Filed Vitals:   04/02/13 1415  BP: 114/62  Pulse: 73  Temp:   Resp: 21   General Appearance:    Alert, cooperative, no distress, appears stated age  Head:    Normocephalic, without obvious abnormality, atraumatic  Eyes:    PERRL, conjunctiva/corneas clear, EOM's intact,      Ears:    Normal TM's and external ear canals, both ears  Nose:   Nares normal, septum midline, mucosa normal, no drainage    or sinus tenderness  Throat:   Lips, mucosa, and tongue normal; teeth and gums normal  Neck:   Supple, symmetrical, trachea midline, no adenopathy;       thyroid:  No enlargement/tenderness/nodules; no carotid   bruit or JVD  Back:     Symmetric, no curvature, ROM normal, no CVA tenderness  Lungs:      Clear to auscultation bilaterally, respirations unlabored     Heart:    Regular rate and rhythm, S1 and S2 normal, no murmur, rub   or gallop  Abdomen:     Soft, non-tender, bowel sounds active all four quadrants,    no masses, no organomegaly, peritoneal catheter in  place.         Extremities:   Extremities normal, atraumatic, no cyanosis or edema  Pulses:   2+ and symmetric all extremities  Skin:   Anterior and upper back rash.   Lymph nodes:   Cervical, supraclavicular, and axillary nodes normal  Neurologic:   CNII-XII intact. Normal strength, sensation and reflexes      throughout    Labs on Admission:  Basic Metabolic Panel:  Recent Labs Lab 04/02/13 1201  NA 138  K 3.3*  CL 96  CO2 18*  GLUCOSE 158*  BUN 86*  CREATININE 9.62*  CALCIUM 7.2*   Liver Function Tests:  Recent Labs Lab 04/02/13 1201  AST 11  ALT 10  ALKPHOS 61  BILITOT 0.5  PROT 5.8*  ALBUMIN 2.4*   No results found for this basename: LIPASE, AMYLASE,  in the last 168 hours No results found for this basename: AMMONIA,  in the last 168 hours CBC:  Recent Labs Lab 04/02/13 1201  WBC 19.4*  NEUTROABS 17.0*  HGB 10.0*  HCT 29.4*  MCV 89.9  PLT 200   Cardiac Enzymes: No results found for this basename: CKTOTAL, CKMB, CKMBINDEX, TROPONINI,  in the last 168 hours  BNP (last 3 results)  Recent Labs  04/02/13 1217  PROBNP 12548.0*   CBG:  Recent Labs Lab 04/02/13 1155  GLUCAP 156*    Radiological Exams on Admission: Dg Chest Port 1 View  04/02/2013   CLINICAL DATA:  Hypotension.  EXAM: PORTABLE CHEST - 1 VIEW  COMPARISON:  06/18/2012.  FINDINGS: Interim removal of dialysis port. Mediastinum and hilar structures are normal. The lungs are clear. No pleural effusion or pneumothorax. COPD. Cardiomegaly, no CHF. No acute bony abnormality.  IMPRESSION: Interim removal of dialysis port. No acute cardiopulmonary disease. Stable cardiomegaly, no CHF.   Electronically Signed   By: Marcello Moores   Register   On: 04/02/2013 13:39    EKG: Independently reviewed. Atrial flutter 2 to 1 block.   Assessment/Plan Principal Problem:   Atrial fibrillation Active Problems:   End stage renal disease   DM (diabetes mellitus)   Hypotension   SIRS (systemic inflammatory response syndrome)  1-SIRS: Patient presents with hypotension, tachycardia, a fib, leukocytosis WBC at 19. He has mild increase lactic acid in setting of renal failure. Lactic acid at 2.2. Will do work up for infectious process. Peritoneal dialysis fluids was pulled off today and send for  culture. I will start vancomycin and cefepime to cover for any intraabdominal infection. He denies cough, abdominal pain. Will order blood culture. Chest x ray negative for infiltrate. Hypotension could be related to A fib RVR. If peritoneal fluid is not consistent with infection could consider stop antibiotics.   2-ESRD: on peritoneal dialysis. Nephrology consulted. Electrolytes correction by nephrology.   3-A fib RVR: patient received 15 mg IV one time dose Cardizem. He converted to sinus rhythm. Cardizem gtt was discontinue by ED physician. HR has remain in the 70. SBP in the 110. He has mildly increase troponin. I have consulted cardiology. Cycle cardiac enzymes. Check ECHO. I will defer rate control medication to cardiology due to soft SBP.   4-Diabetes: will decrease home dose Lantus to 15 units, patient with poor oral intake. SSI.      Code Status: Full Code.  Family Communication: Care discussed with patient and wife who was at bed side.  Disposition Plan: expect 2 to 3 days inpatient.   Time spent: 65 minutes.   Paul [name] [facility] Pager  [phone]  If 7PM-7AM, please contact night-coverage [email or web address] Password [facility] 04/02/2013, 2:39 PM

## 2013-04-02 NOTE — Progress Notes (Signed)
04/02/2013 1:38 PM  PD fluid pulled off of patient per Emergency Room MD request to eval fluid.  Pulled about 100cc of clear, light yellow fluid from patient without issues.  Pt tolerated procedure well.  Fluid sent to lab for analysis.   Paul [name]

## 2013-04-02 NOTE — ED Notes (Signed)
Critical Lab results was given to Dr.Plunkett.

## 2013-04-02 NOTE — ED Notes (Signed)
Nephrology md at bedside 

## 2013-04-02 NOTE — Progress Notes (Signed)
ANTICOAGULATION CONSULT NOTE - Initial Consult  Pharmacy Consult for Warfarin Indication: atrial fibrillation  Allergies  Allergen Reactions  . Metformin And Related Other (See Comments)    Kidney problems    Patient Measurements: Height: 6\' 2"  (188 cm) Weight: 215 lb 6.2 oz (97.7 kg) IBW/kg (Calculated) : 82.2  Vital Signs: Temp: 97.6 F (36.4 C) (11/14 1639) Temp src: Oral (11/14 1639) BP: 111/59 mmHg (11/14 1600) Pulse Rate: 68 (11/14 1600)  Labs:  Recent Labs  04/02/13 1201 04/02/13 1620  HGB 10.0*  --   HCT 29.4*  --   PLT 200  --   CREATININE 9.62*  --   TROPONINI  --  <0.30    Estimated Creatinine Clearance: 9.6 ml/min (by C-G formula based on Cr of 9.62).   Medical History: Past Medical History  Diagnosis Date  . Diabetic neuropathy   . Cancer     History of Duodenal Cancer  . Chronic kidney disease     Pt is not eligible for transplant per [facility]  . Diabetes mellitus     Type 2 iddm x 11 yrs  . Anemia     esrd  . Hypertension     meds d/c 'd  2 wks ago for postural hypotension  . Fatigue      tires easily x 2 months  . Chronic nausea     with  morning vomiting; better off  HTN meds  . Intermittent tremor   . Closed left arm fracture 1967  . Heart murmur   . Corneal abrasion, left   . Shortness of breath     exertional  . GERD (gastroesophageal reflux disease)   . History of blood transfusion   . Lack of coordination   . Muscle weakness (generalized)   . Glomerulosclerosis, diabetic   . Depression      Assessment: 55yom with ESRD currently on PD, admitted from ED today with new onset AFib but converted quickly after diltiazem drip initiation.  Plan is to start anticoagulation with warfarin.    Goal of Therapy:  INR 2-3 Monitor platelets by anticoagulation protocol: Yes   Plan:  Warfarin 5mg  x1 Daily Protime Monitor s/s bleeding  Bonnita Nasuti Pharm.D. CPP, BCPS Clinical Pharmacist [phone] 04/02/2013 5:20 PM

## 2013-04-02 NOTE — Consult Note (Signed)
CARDIOLOGY CONSULT NOTE   Patient ID: Paul [name] MRN: [id number] DOB/AGE: [birth year] 57 y.o.  Admit date: 04/02/2013  Primary Physician   [name], MD Primary Cardiologist   None Reason for Consultation   A. Fib with RVR.   HPI: Paul [name] is a 57 year old man with PMH significant for HTN, DM2, ESRD on peritoneal dialysis who presented to the [facility] for evaluation of lightheadedness, nausea, vomiting,  low blood pressure for 5 days with worsening fatigue and heart palpitations on the day of his presentation. He notes an 8 lbs weight gain in the past 2 weeks. In the ED he was found to have atrial fibrillation with HR of 118. He was given Cardizem 15mg  bolus and started on Cardizem drip with subsequent conversion to sinus rhythm. He is orthostatic with mildly elevated lactate and leucocytosis, concerning for an infectious process. He denies fever, chills, dyspnea, cough, abdominal pain, or chest pain.   Past Medical History  Diagnosis Date  . Diabetic neuropathy   . Cancer     History of Duodenal Cancer  . Chronic kidney disease     Pt is not eligible for transplant per [facility]  . Diabetes mellitus     Type 2 iddm x 11 yrs  . Anemia     esrd  . Hypertension     meds d/c 'd  2 wks ago for postural hypotension  . Fatigue      tires easily x 2 months  . Chronic nausea     with  morning vomiting; better off  HTN meds  . Intermittent tremor   . Closed left arm fracture 1967  . Heart murmur   . Corneal abrasion, left   . Shortness of breath     exertional  . GERD (gastroesophageal reflux disease)   . History of blood transfusion   . Lack of coordination   . Muscle weakness (generalized)   . Glomerulosclerosis, diabetic   . Depression      Past Surgical History  Procedure Laterality Date  . Whipple procedure  2002    duodenal ca  . Bone marrow biopsy  2012  . Renal biopsy, percutaneous  2012  . Tonsillectomy    . Portacath placement  2002  . Biliary diversion,  external  2002  . Other surgical history      Cyst removed from back  . Av fistula placement  06/07/2011    Procedure: ARTERIOVENOUS (AV) FISTULA CREATION;  Surgeon: Angelia Mould, MD;  Location: [place];  Service: Vascular;  Laterality: Left;  . Ligation of arteriovenous  fistula  05/22/2012    Procedure: LIGATION OF ARTERIOVENOUS  FISTULA;  Surgeon: Mal Misty, MD;  Location: [place];  Service: Vascular;  Laterality: Left;  Left brachio-cephalic fistula  . Insertion of dialysis catheter  05/22/2012    Procedure: INSERTION OF DIALYSIS CATHETER;  Surgeon: Mal Misty, MD;  Location: [place];  Service: Vascular;  Laterality: N/A;  right internal jugular vein  . Av fistula placement  06/18/2012    Procedure: ARTERIOVENOUS (AV) FISTULA CREATION;  Surgeon: Mal Misty, MD;  Location: [place];  Service: Vascular;  Laterality: Right;  [place]  . Colonoscopy      Hx: of  . Capd insertion N/A 01/14/2013    Procedure: LAPAROSCOPIC INSERTION CONTINUOUS AMBULATORY PERITONEAL DIALYSIS  (CAPD) CATHETER;  Surgeon: Adin Hector, MD;  Location: [place];  Service: General;  Laterality: N/A;    Allergies  Allergen Reactions  . Metformin And Related Other (See Comments)    Kidney problems    I have reviewed the patient's current medications   . ampicillin-sulbactam (UNASYN) IV    . diltiazem (CARDIZEM) infusion Stopped (04/02/13 1359)     Prior to Admission medications   Medication Sig Start Date End Date Taking? Authorizing Provider  aspirin EC 81 MG tablet Take 81 mg by mouth daily.   Yes Historical Provider, MD  atorvastatin (LIPITOR) 10 MG tablet Take 10 mg by mouth daily.    Yes Historical Provider, MD  calcitRIOL (ROCALTROL) 0.25 MCG capsule Take 0.25 mcg by mouth daily.    Yes Historical Provider, MD  calcium acetate (PHOSLO) 667 MG capsule Take 667 mg by mouth 2 (two) times daily.  06/07/11  Yes Historical Provider, MD  cholecalciferol (VITAMIN D) 1000 UNITS tablet Take 1,000 Units by  mouth daily.   Yes Historical Provider, MD  cyclobenzaprine (FLEXERIL) 10 MG tablet Take 10 mg by mouth at bedtime.   Yes Historical Provider, MD  Epoetin Alfa (EPOGEN IJ) Inject as directed 3 (three) times a week. Monday, Wednesday, friday   Yes Historical Provider, MD  epoetin alfa (EPOGEN,PROCRIT) 57846 UNIT/ML injection 10,000 Units every Monday, Wednesday, and Friday.   Yes Historical Provider, MD  insulin glargine (LANTUS) 100 UNIT/ML injection Inject 20-27 Units into the skin every morning.   Yes Historical Provider, MD  metoCLOPramide (REGLAN) 5 MG tablet Take 5 mg by mouth 4 (four) times daily -  with meals and at bedtime.    Yes Historical Provider, MD  omeprazole (PRILOSEC) 40 MG capsule Take 40 mg by mouth daily.   Yes Historical Provider, MD  sertraline (ZOLOFT) 50 MG tablet Take 50 mg by mouth daily.  12/08/12  Yes Historical Provider, MD  ONE TOUCH ULTRA TEST test strip  04/21/12   Historical Provider, MD     History   Social History  . Marital Status: Married    Spouse Name: N/A    Number of Children: N/A  . Years of Education: N/A   Occupational History  . Not on file.   Social History Main Topics  . Smoking status: Never Smoker   . Smokeless tobacco: Never Used  . Alcohol Use: No  . Drug Use: No  . Sexual Activity: Not on file   Other Topics Concern  . Not on file   Social History Narrative  . No narrative on file    Family Status  Relation Status Death Age  . Mother Deceased 88    pancreatic cancer  . Father Alive    Family History  Problem Relation Age of Onset  . Cancer Mother   . Cancer Father     prostate cancer  . Heart disease Maternal Grandfather   . Stroke Paternal Grandmother      ROS:  Full 14 point review of systems complete and found to be negative unless listed below.  He has lightheadedness, nausea, vomiting, and bilateral leg edema. Diffuse rash over his back and chest since January 2014.   Physical Exam: Blood pressure 114/62,  pulse 73, temperature 97.7 F (36.5 C), temperature source Oral, resp. rate 21, height 6\' 2"  (1.88 m), weight 200 lb (90.719 kg), SpO2 100.00%.  General: Well developed, chronically ill appearing male in no acute distress Head: Eyes PERRLA, No xanthomas.   Normocephalic and atraumatic, oropharynx without edema or exudate. Dentition: Good Lungs: Clear to auscultation bilaterally.  Heart: HRRR S1 S2, no rub/gallop, 2/6 SEM  murmur heard best at the RUSB with radiation to the apex. DP and radial pulses are 2+ and equally bilaterally.  AF on left forearm with good thrill.  Neck: No carotid bruits. No lymphadenopathy.  No JVD. Abdomen: Soft, bowel sounds present, non-tender without masses or hernias noted. PD catheter site clean, dry, and intact with no purulent discharge.  Msk:  No spine tenderness. No weakness, no joint deformities or effusions. Extremities: No clubbing or cyanosis.  1+ pitting edema bilaterally up to his knees.  Neuro: Alert and oriented X 3. No focal deficits noted. Psych:  Good affect, responds appropriately Skin: Diffuse crusted rash with erythematous borders and no papules over his back and chest. Facial pallor.   Labs:   Lab Results  Component Value Date   WBC 19.4* 04/02/2013   HGB 10.0* 04/02/2013   HCT 29.4* 04/02/2013   MCV 89.9 04/02/2013   PLT 200 04/02/2013   No results found for this basename: INR,  in the last 72 hours  Recent Labs Lab 04/02/13 1201  NA 138  K 3.3*  CL 96  CO2 18*  BUN 86*  CREATININE 9.62*  CALCIUM 7.2*  PROT 5.8*  BILITOT 0.5  ALKPHOS 61  ALT 10  AST 11  GLUCOSE 158*  ALBUMIN 2.4*   No results found for this basename: mg   No results found for this basename: CKTOTAL, CKMB, TROPONINI,  in the last 72 hours  Recent Labs  04/02/13 1211  TROPIPOC 0.11*   Pro B Natriuretic peptide (BNP)  Date/Time Value Range Status  04/02/2013 12:17 PM 12548.0* 0 - 125 pg/mL Final   No results found for this basename: CHOL, HDL,  LDLCALC, TRIG   No results found for this basename: DDIMER   No results found for this basename: Lipase, amylase   No results found for this basename: tsh, t4total, freet3, t49free, thyroidab   Ferritin  Date/Time Value Range Status  07/09/2011  9:19 AM 323* 22 - 322 ng/mL Final     TIBC  Date/Time Value Range Status  07/09/2011  9:19 AM 244  215 - 435 ug/dL Final     Iron  Date/Time Value Range Status  07/09/2011  9:19 AM 57  42 - 165 ug/dL Final    Echo:  11/09/12 2D echo at [facility]: EF >55%, mild LVH, mild MR, trivial PRN, Trivial TR, aortic valve mildly thickened.   11/09/12 Stress echo at [facility]: Normal Stress Test. EF 55%, with 8 minutes duration. Normal hyperdynamic LV response  ECG:  04/02/13 @ 13:40 : NSR, possible anteroseptal infarct with q waves in leads V1-V2, baseline wander in lead 4.   Radiology:  Dg Chest Port 1 View  04/02/2013   CLINICAL DATA:  Hypotension.  EXAM: PORTABLE CHEST - 1 VIEW  COMPARISON:  06/18/2012.  FINDINGS: Interim removal of dialysis port. Mediastinum and hilar structures are normal. The lungs are clear. No pleural effusion or pneumothorax. COPD. Cardiomegaly, no CHF. No acute bony abnormality.  IMPRESSION: Interim removal of dialysis port. No acute cardiopulmonary disease. Stable cardiomegaly, no CHF.   Electronically Signed   By: Marcello Moores  Register   On: 04/02/2013 13:39    ASSESSMENT AND PLAN:   Paul [name] is a 57 year old man with PMH significant for ESRD on peritoneal dialysis, DM2, and HTN, presenting with 5 days of N/V, lightheadedness and hypotension in addition to heart palpitation x 1day, found to have newly diagnosed A.fib with RVR.   1. A.fib with RVR -  No previous  history of A.fib, valvular disease, or CHF.  A.fib resolved with Cardizem bolus and drip. Total duration of A.fib is unknown but probably less than 24 hours. He has no cardiac disease history. His CHADS2Vasc score is 3 putting him at high risk for thrombus and we will start  warfarin--he has no hx of bleeding.   -Troponin now and q 6hr -EKG in AM -Continue telemetry monitoring.  -2D echo.  -lipid panel, TSH.  -Warfarin per pharmacy.  -If A. Fib reoccurs, would give Cardizem  2. HTN/ Hypotension - BP stable after A.fib resolved. Could be secondary to A.fib or infectious process. Nephrology following. Per primary team, blood cultures and peritoneal fluid analysis and culture ordered per primary team.   3. Hypokalemia - Mild, with K of 3.3. Gentle repletion by Nephrology.   4. ESRD on peritoneal HD - Pt seen by Nephrology.   5. DM2 - Per primary team.   6. Leucocytosis - Could be secondary to infectious process or stress demargination. CBC trending, antibiotics, and and further diagnostic work up as above, per primary team.    Signed: Blain Pais, MD 04/02/2013 2:48 PM Patient seen and examined and history reviewed. Agree with above findings and plan. Paul [name] with history of DM, HTN, ESRD on peritoneal dialysis. Presents with new onset Afib with RVR rate up to 144 bpm. Converted after IV diltiazem. No prior history of Afib. Cardiac workup done at [facility] as part of pretransplant evaluation in 6/14 included Echo and stress Echo studies which were unremarkable. Given afib we will repeat Echo. Unless troponins increase significantly I don't think he needs further ischemic work up. With elevated CHAD-Vasc score I would recommend long term anticoagulation to reduce risk of stroke. No prior bleeding issues. Not a candidate for NOACs with ESRD so will start coumadin. DC ASA. With recent hypotension I would not start him on AV nodal blocking agent at this time but observe for recurrence.check TSH.   Collier Salina [facility] 04/02/2013 5:12 PM

## 2013-04-02 NOTE — Progress Notes (Signed)
Utilization Review Completed.  

## 2013-04-02 NOTE — Consult Note (Signed)
Paul [name] 04/02/2013 Pearson Grippe, B Requesting Physician:  Blanchie Dessert, MD  Reason for Consult:  Management of ESRD, peritoneal dialysis, volume status HPI: The patient is a 57 y.o. year-old male with ESRD on PD since summer 2014 and primary disease of likely DN admitted with new onset AFib with RVR (EKG HR 140s), fatigue, palpitations.  Pt has been having some issues with hypotension with PD at home and has noted inc weight over time from 82 to 88kg.  His SBPs latley have been 110s to 120s.  At this new weight his previous symptoms of fatigue and dizziness had lessened.  Today however, he arrived at work and felt profoundly fatigued, dizzy, and had palpitations.  Upon arrival to the ED he had RVR and hypotension.  He has converted to NSR with SBP in 120s and was on a diltiazem gtt.    Pt denies abd pain, f/c, cloudy effluent, inflow/outflow problems, missed treatments, LEE, SOB, CP.    ROS: balance of 12 systems is negative w/ exception of HPI.     Past Medical History  Past Medical History  Diagnosis Date  . Diabetic neuropathy   . Cancer     History of Duodenal Cancer  . Chronic kidney disease     Pt is not eligible for transplant per [facility]  . Diabetes mellitus     Type 2 iddm x 11 yrs  . Anemia     esrd  . Hypertension     meds d/c 'd  2 wks ago for postural hypotension  . Fatigue      tires easily x 2 months  . Chronic nausea     with  morning vomiting; better off  HTN meds  . Intermittent tremor   . Closed left arm fracture 1967  . Heart murmur   . Corneal abrasion, left   . Shortness of breath     exertional  . GERD (gastroesophageal reflux disease)   . History of blood transfusion   . Lack of coordination   . Muscle weakness (generalized)   . Glomerulosclerosis, diabetic   . Depression    Past Surgical History  Past Surgical History  Procedure Laterality Date  . Whipple procedure  2002    duodenal ca  . Bone marrow biopsy  2012  . Renal biopsy,  percutaneous  2012  . Tonsillectomy    . Portacath placement  2002  . Biliary diversion, external  2002  . Other surgical history      Cyst removed from back  . Av fistula placement  06/07/2011    Procedure: ARTERIOVENOUS (AV) FISTULA CREATION;  Surgeon: Angelia Mould, MD;  Location: [place];  Service: Vascular;  Laterality: Left;  . Ligation of arteriovenous  fistula  05/22/2012    Procedure: LIGATION OF ARTERIOVENOUS  FISTULA;  Surgeon: Mal Misty, MD;  Location: [place];  Service: Vascular;  Laterality: Left;  Left brachio-cephalic fistula  . Insertion of dialysis catheter  05/22/2012    Procedure: INSERTION OF DIALYSIS CATHETER;  Surgeon: Mal Misty, MD;  Location: [place];  Service: Vascular;  Laterality: N/A;  right internal jugular vein  . Av fistula placement  06/18/2012    Procedure: ARTERIOVENOUS (AV) FISTULA CREATION;  Surgeon: Mal Misty, MD;  Location: [place];  Service: Vascular;  Laterality: Right;  [place]  . Colonoscopy      Hx: of  . Capd insertion N/A 01/14/2013    Procedure: LAPAROSCOPIC INSERTION CONTINUOUS AMBULATORY PERITONEAL DIALYSIS  (  CAPD) CATHETER;  Surgeon: Adin Hector, MD;  Location: [place];  Service: General;  Laterality: N/A;   Family History  Family History  Problem Relation Age of Onset  . Cancer Mother   . Cancer Father     prostate cancer  . Heart disease Maternal Grandfather   . Stroke Paternal Grandmother    Social History  reports that he has never smoked. He has never used smokeless tobacco. He reports that he does not drink alcohol or use illicit drugs. Allergies  Allergies  Allergen Reactions  . Metformin And Related Other (See Comments)    Kidney problems   Home medications Prior to Admission medications   Medication Sig Start Date End Date Taking? Authorizing Provider  aspirin EC 81 MG tablet Take 81 mg by mouth daily.   Yes Historical Provider, MD  atorvastatin (LIPITOR) 10 MG tablet Take 10 mg by mouth daily.    Yes  Historical Provider, MD  calcitRIOL (ROCALTROL) 0.25 MCG capsule Take 0.25 mcg by mouth daily.    Yes Historical Provider, MD  calcium acetate (PHOSLO) 667 MG capsule Take 667 mg by mouth 2 (two) times daily.  06/07/11  Yes Historical Provider, MD  tabletcholecalciferol (VITAMIN D) 1000 UNITS tablet Take 1,000 Units by mouth daily.   Yes Historical Provider, MD  cyclobenzaprine (FLEXERIL) 10 MG tablet Take 10 mg by mouth at bedtime.   Yes Historical Provider, MD  Epoetin Alfa (EPOGEN IJ) Inject as directed 3 (three) times a week. Monday, Wednesday, friday   Yes Historical Provider, MD  epoetin alfa (EPOGEN,PROCRIT) 09811 UNIT/ML injection 10,000 Units every Monday, Wednesday, and Friday.   Yes Historical Provider, MD  insulin glargine (LANTUS) 100 UNIT/ML injection Inject 20-27 Units into the skin every morning.   Yes Historical Provider, MD  metoCLOPramide (REGLAN) 5 MG tablet Take 5 mg by mouth 4 (four) times daily -  with meals and at bedtime.    Yes Historical Provider, MD  omeprazole (PRILOSEC) 40 MG capsule Take 40 mg by mouth daily.   Yes Historical Provider, MD  sertraline (ZOLOFT) 50 MG tablet Take 50 mg by mouth daily.  12/08/12  Yes Historical Provider, MD  ONE TOUCH ULTRA TEST test strip  04/21/12   Historical Provider, MD   Liver Function Tests  Recent Labs Lab 04/02/13 1201  AST 11  ALT 10  ALKPHOS 61  BILITOT 0.5  PROT 5.8*  ALBUMIN 2.4*   No results found for this basename: LIPASE, AMYLASE,  in the last 168 hours CBC  Recent Labs Lab 04/02/13 1201  WBC 19.4*  NEUTROABS 17.0*  HGB 10.0*  HCT 29.4*  MCV 89.9  PLT [id number]   Basic Metabolic Panel  Recent Labs Lab 04/02/13 1201  NA 138  K 3.3*  CL 96  CO2 18*  GLUCOSE 158*  BUN 86*  CREATININE 9.62*  CALCIUM 7.2*   Physical Exam:  Blood pressure 114/62, pulse 73, temperature 97.7 F (36.5 C), temperature source Oral, resp. rate 21, height 6\' 2"  (1.88 m), weight 90.719 kg (200 lb), SpO2 100.00%. Gen: NAD,  appears well.   Skin: Has discrete erythematous rash over chest and back with some crusting. HEENT:  EOMI, sclera anicteric, Neck: supple Chest: CTAB.  Nl wob CV: Rhythm regular, no mgr, pedal pulses 2+ Abdomen: soft, nd, nt.  PD catheter site c/d/i Ext: No lower extremity edema,  Neuro: alert, Ox3, nonfocal,  Outpatient PD (CCPD; 1 pause, 2L; 4 noctural exchanges 2.25L 15 min fill, 22 min drain;  day dwell 2L; 1.5 and 2.5% lately)  Impression and Plan 1. Dizzy / Hypotensive in setting of new finding of AFib with RVR: Per CCM.  I will restart a small dose of daily PO KCl (20 BID) for mild hypokalemia.   2. Leukocytosis: though he does have a leukocytosis he has no abd pain or cloudy effluent.  Cell count of PD fluid is pending.  I do not believe he has peritonitis.  He does not need ABX for an abdominal infection unless his cell count is consistent with peritonitis, which I doubt will be the case.  3. ESRD on PD: Will cont home regimen.  Cell count ordered and we will f/u.  Use all 1.5% tonight.   4. Anemia- Stable.  Follow for now 5. Sec HPT- Cont calcitriol and PhosLo  Pearson Grippe MD 04/02/2013, 2:58 PM

## 2013-04-02 NOTE — ED Notes (Signed)
Dr. Plunkett at bedside.  

## 2013-04-02 NOTE — ED Notes (Signed)
Meal tray ordered 

## 2013-04-03 DIAGNOSIS — F329 Major depressive disorder, single episode, unspecified: Secondary | ICD-10-CM | POA: Diagnosis present

## 2013-04-03 DIAGNOSIS — K219 Gastro-esophageal reflux disease without esophagitis: Secondary | ICD-10-CM | POA: Diagnosis present

## 2013-04-03 DIAGNOSIS — D649 Anemia, unspecified: Secondary | ICD-10-CM

## 2013-04-03 LAB — CBC
Hemoglobin: 8.1 g/dL — ABNORMAL LOW (ref 13.0–17.0)
MCH: 30.6 pg (ref 26.0–34.0)
MCHC: 34.3 g/dL (ref 30.0–36.0)
MCV: 89.1 fL (ref 78.0–100.0)
Platelets: 192 10*3/uL (ref 150–400)
RBC: 2.65 MIL/uL — ABNORMAL LOW (ref 4.22–5.81)
WBC: 9.8 10*3/uL (ref 4.0–10.5)

## 2013-04-03 LAB — BASIC METABOLIC PANEL
CO2: 22 mEq/L (ref 19–32)
Creatinine, Ser: 10.13 mg/dL — ABNORMAL HIGH (ref 0.50–1.35)
GFR calc Af Amer: 6 mL/min — ABNORMAL LOW (ref >90)
Glucose, Bld: 138 mg/dL — ABNORMAL HIGH (ref 70–99)
Potassium: 3.4 mEq/L — ABNORMAL LOW (ref 3.5–5.1)
Sodium: 138 mEq/L (ref 135–145)

## 2013-04-03 LAB — TSH: TSH: 2.805 u[IU]/mL (ref 0.350–4.500)

## 2013-04-03 LAB — TROPONIN I: Troponin I: <0.3 ng/mL (ref <0.30)

## 2013-04-03 LAB — PROTIME-INR: Prothrombin Time: 16 seconds — ABNORMAL HIGH (ref 11.6–15.2)

## 2013-04-03 LAB — MAGNESIUM: Magnesium: 1.5 mg/dL (ref 1.5–2.5)

## 2013-04-03 LAB — GLUCOSE, CAPILLARY
Glucose-Capillary: 133 mg/dL — ABNORMAL HIGH (ref 70–99)
Glucose-Capillary: 169 mg/dL — ABNORMAL HIGH (ref 70–99)
Glucose-Capillary: 170 mg/dL — ABNORMAL HIGH (ref 70–99)
Glucose-Capillary: 171 mg/dL — ABNORMAL HIGH (ref 70–99)

## 2013-04-03 LAB — LIPID PANEL
Cholesterol: 76 mg/dL (ref 0–200)
HDL: 33 mg/dL — ABNORMAL LOW (ref >39)
Total CHOL/HDL Ratio: 2.3 RATIO
Triglycerides: 24 mg/dL (ref <150)
VLDL: 5 mg/dL (ref 0–40)

## 2013-04-03 MED ORDER — DELFLEX-LC/1.5% DEXTROSE 346 MOSM/L IP SOLN
INTRAPERITONEAL | Status: DC
  Administered 2013-04-03: 20:00:00 via INTRAPERITONEAL

## 2013-04-03 MED ORDER — DELFLEX-LC/1.5% DEXTROSE 346 MOSM/L IP SOLN
INTRAPERITONEAL | Status: DC
  Administered 2013-04-03: 16:00:00 via INTRAPERITONEAL

## 2013-04-03 MED ORDER — WARFARIN SODIUM 5 MG PO TABS
5.0000 mg | ORAL_TABLET | Freq: Once | ORAL | Status: AC
  Administered 2013-04-03: 5 mg via ORAL
  Filled 2013-04-03: qty 1

## 2013-04-03 MED ORDER — DELFLEX-LC/1.5% DEXTROSE 346 MOSM/L IP SOLN
Freq: Once | INTRAPERITONEAL | Status: AC
  Administered 2013-04-03: 12:00:00 via INTRAPERITONEAL

## 2013-04-03 MED ORDER — DELFLEX-LC/2.5% DEXTROSE 394 MOSM/L IP SOLN: Freq: Once | INTRAPERITONEAL | Status: DC

## 2013-04-03 MED ORDER — DELFLEX-LC/1.5% DEXTROSE 346 MOSM/L IP SOLN
INTRAPERITONEAL | Status: DC
  Administered 2013-04-04 (×3): via INTRAPERITONEAL

## 2013-04-03 NOTE — Progress Notes (Signed)
Subjective:   Doing well. No further AF on tele. Breathing fine. Trops negative. No CP or SOB.   Echo EF 55%. Normal RV      Intake/Output Summary (Last 24 hours) at 04/03/13 0737 Last data filed at 04/02/13 1900  Gross per 24 hour  Intake    550 ml  Output      0 ml  Net    550 ml    Current meds: . atorvastatin  10 mg Oral Daily  . calcitRIOL  0.25 mcg Oral Daily  . calcium acetate  1,334 mg Oral TID WC  . ceFEPime (MAXIPIME) IV  2 g Intravenous Q48H  . dialysis solution 1.5% low-MG/low-CA   Intraperitoneal Once in dialysis  . dialysis solution 1.5% low-MG/low-CA   Intraperitoneal Once in dialysis  . docusate sodium  100 mg Oral BID  . heparin  5,000 Units Subcutaneous Q8H  . insulin aspart  0-9 Units Subcutaneous TID WC  . [START ON 04/04/2013] insulin glargine  16 Units Subcutaneous QAC breakfast  . metoCLOPramide  5 mg Oral TID WC & HS  . pantoprazole  40 mg Oral Daily  . potassium chloride  20 mEq Oral BID  . sertraline  50 mg Oral Daily  . sodium chloride  3 mL Intravenous Q12H  . sodium chloride  3 mL Intravenous Q12H  . warfarin   Does not apply Once  . Warfarin - Pharmacist Dosing Inpatient   Does not apply q1800   Infusions: . dialysis solution 1.5% low-MG/low-CA    . dialysis solution 1.5% low-MG/low-CA       Objective:  Blood pressure 120/59, pulse 67, temperature 98.6 F (37 C), temperature source Oral, resp. rate 12, height 6\' 2"  (1.88 m), weight 87.9 kg (193 lb 12.6 oz), SpO2 92.00%. Weight change:   Physical Exam: General:  Well appearing. No resp difficulty HEENT: normal Neck: supple. JVP 8-9 . Carotids 2+ bilat; no bruits. No lymphadenopathy or thryomegaly appreciated. Cor: PMI nondisplaced. Regular rate & rhythm. No rubs, gallops or murmurs. Lungs: clear Abdomen: soft, nontender, nondistended. No hepatosplenomegaly. No bruits or masses. Good bowel sounds. Extremities: no cyanosis, clubbing, rash, edema Neuro: alert & orientedx3,  cranial nerves grossly intact. moves all 4 extremities w/o difficulty. Affect pleasant  Telemetry: SR  Lab Results: Basic Metabolic Panel:  Recent Labs Lab 04/02/13 1201 04/03/13 0225  NA 138 138  K 3.3* 3.4*  CL 96 97  CO2 18* 22  GLUCOSE 158* 138*  BUN 86* 90*  CREATININE 9.62* 10.13*  CALCIUM 7.2* 7.3*   Liver Function Tests:  Recent Labs Lab 04/02/13 1201  AST 11  ALT 10  ALKPHOS 61  BILITOT 0.5  PROT 5.8*  ALBUMIN 2.4*   No results found for this basename: LIPASE, AMYLASE,  in the last 168 hours No results found for this basename: AMMONIA,  in the last 168 hours CBC:  Recent Labs Lab 04/02/13 1201 04/03/13 0225  WBC 19.4* 9.8  NEUTROABS 17.0*  --   HGB 10.0* 8.1*  HCT 29.4* 23.6*  MCV 89.9 89.1  PLT 200 192   Cardiac Enzymes:  Recent Labs Lab 04/02/13 1620 04/02/13 2015 04/03/13 0225  TROPONINI <0.30 <0.30 <0.30   BNP: No components found with this basename: POCBNP,  CBG:  Recent Labs Lab 04/02/13 1155 04/02/13 1537 04/02/13 1750 04/02/13 2148  GLUCAP 156* 147* 158* 126*   Microbiology: Lab Results  Component Value Date   CULT PENDING 04/02/2013    Recent Labs Lab 04/02/13  [place]    Imaging: Dg Chest Port 1 View  04/02/2013   CLINICAL DATA:  Hypotension.  EXAM: PORTABLE CHEST - 1 VIEW  COMPARISON:  06/18/2012.  FINDINGS: Interim removal of dialysis port. Mediastinum and hilar structures are normal. The lungs are clear. No pleural effusion or pneumothorax. COPD. Cardiomegaly, no CHF. No acute bony abnormality.  IMPRESSION: Interim removal of dialysis port. No acute cardiopulmonary disease. Stable cardiomegaly, no CHF.   Electronically Signed   By: Marcello Moores  Register   On: 04/02/2013 13:39     ASSESSMENT:  1) PAF  CHADSVASc = 3 2) ESRD on PD  PLAN/DISCUSSION:  Doing well. No further AF. Still mildly volume overloaded on exam. Agree with anti-coagulation. Based on pharmacokinetic data only  Eliquis has been approved for stroke prevention in patients with AF and ESRD. Clinical experience is limited. I think it is a reasonable alternative for him but can discuss further as outpatient. Will leave him on warfarin for now. If has trouble regulating INRs can reconsider. Have told him that AF begets AF and he is likely to have further episodes.   Will sign off.    LOS: 1 day    Glori Bickers, MD 04/03/2013, 7:37 AM

## 2013-04-03 NOTE — Progress Notes (Signed)
Pt watching coumadin video.

## 2013-04-03 NOTE — Progress Notes (Addendum)
Chart reviewed. Discussed with Dr. Joelyn Oms  [facility] PROGRESS NOTE  Paul [name] [id number] DOB: 1955/07/02 DOA: 04/02/2013 PCP: [name], MD  Assessment/Plan:  Principal Problem:   Atrial fibrillation resolved. Transferred to telemetry on renal floor. Agree with Coumadin. Echocardiogram shows good ejection fraction. Apparently, weight has increased from usual dry weight, which may have precipitated this. Hypotension may have been secondary to A. fib with RVR. No evidence of infection. Peritoneal fluid without signs of peritonitis. Will stop antibiotics. White count is now normal. No fever. No cough rash or other evidence of infection. TSH pending A It showedsctive Problems:   End stage renal disease on peritoneal dialysis. per renal   Anemia, chronic    DM (diabetes mellitus), stable    Hypotension, resolved. Transfer out of step down.    Depression: Continue current    GERD (gastroesophageal reflux disease): On PPI  Hypokalemia: replete. Check mag  Code Status: full Family Communication: none at bedside Disposition Plan: home  Consultants:  Cardiology  Nephrology   Procdures:  None   Antibiotics:  Vancomycin 11/14 - 11/15  Cefepime 11/14 - 11/15  HPI/Subjective: Denies shortness of breath, palpitations, chest pain. He thinks he may have had a brief episode of palpitations several weeks ago. Denies heavy alcohol use, caffeine, decongestants. Has not been out of bed yet, but currently feeling fine.  Objective: Filed Vitals:   04/03/13 0800  BP:   Pulse:   Temp: 97.6 F (36.4 C)  Resp:     Intake/Output Summary (Last 24 hours) at 04/03/13 0929 Last data filed at 04/02/13 1900  Gross per 24 hour  Intake    550 ml  Output      0 ml  Net    550 ml   Filed Weights   04/02/13 1925 04/03/13 0615 04/03/13 0645  Weight: 90.7 kg (199 lb 15.3 oz) 90.6 kg (199 lb 11.8 oz) 87.9 kg (193 lb 12.6 oz)    telemetry: Normal sinus rhythm    Exam:   General:  Comfortable. Alert and oriented.   Cardiovascular: regular rate rhythm without murmurs gallops rubs   Respiratory: CLEAR TO AUSCULTATION BILATERALLY WITHOUT WHEEZES RHONCHI OR RALES   Abdomen: soft nontender nondistended. Peritoneal cath site okay.  Musculoskeletal:  no clubbing cyanosis or edema.   Data Reviewed: Basic Metabolic Panel:  Recent Labs Lab 04/02/13 1201 04/03/13 0225  NA 138 138  K 3.3* 3.4*  CL 96 97  CO2 18* 22  GLUCOSE 158* 138*  BUN 86* 90*  CREATININE 9.62* 10.13*  CALCIUM 7.2* 7.3*   Liver Function Tests:  Recent Labs Lab 04/02/13 1201  AST 11  ALT 10  ALKPHOS 61  BILITOT 0.5  PROT 5.8*  ALBUMIN 2.4*   No results found for this basename: LIPASE, AMYLASE,  in the last 168 hours No results found for this basename: AMMONIA,  in the last 168 hours CBC:  Recent Labs Lab 04/02/13 1201 04/03/13 0225  WBC 19.4* 9.8  NEUTROABS 17.0*  --   HGB 10.0* 8.1*  HCT 29.4* 23.6*  MCV 89.9 89.1  PLT 200 192   Cardiac Enzymes:  Recent Labs Lab 04/02/13 1620 04/02/13 2015 04/03/13 0225  TROPONINI <0.30 <0.30 <0.30   BNP (last 3 results)  Recent Labs  04/02/13 1217  PROBNP 12548.0*   CBG:  Recent Labs Lab 04/02/13 1155 04/02/13 1537 04/02/13 1750 04/02/13 2148  GLUCAP 156* 147* 158* 126*    Recent Results (from the past 240 hour(s))  BODY FLUID CULTURE  Status: None   Collection Time    04/02/13  1:30 PM      Result Value Range Status   Specimen Description PERITONEAL   Final   Special Requests Normal   Final   Gram Stain     Final   Value: NO WBC SEEN     NO ORGANISMS SEEN     Performed at [facility]   Culture PENDING   Incomplete   Report Status PENDING   Incomplete  MRSA PCR SCREENING     Status: None   Collection Time    04/02/13  3:31 PM      Result Value Range Status   MRSA by PCR NEGATIVE  NEGATIVE Final   Comment:            The GeneXpert MRSA Assay (FDA     approved for  NASAL specimens     only), is one component of a     comprehensive MRSA colonization     surveillance program. It is not     intended to diagnose MRSA     infection nor to guide or     monitor treatment for     MRSA infections.     Studies: Dg Chest Port 1 View  04/02/2013   CLINICAL DATA:  Hypotension.  EXAM: PORTABLE CHEST - 1 VIEW  COMPARISON:  06/18/2012.  FINDINGS: Interim removal of dialysis port. Mediastinum and hilar structures are normal. The lungs are clear. No pleural effusion or pneumothorax. COPD. Cardiomegaly, no CHF. No acute bony abnormality.  IMPRESSION: Interim removal of dialysis port. No acute cardiopulmonary disease. Stable cardiomegaly, no CHF.   Electronically Signed   By: Marcello Moores  Register   On: 04/02/2013 13:39   Echo Left ventricle: The cavity size was mildly dilated. Wall thickness was normal. Systolic function was normal. The estimated ejection fraction was in the range of 55% to 60%. Wall motion was normal; there were no regional wall motion abnormalities. Features are consistent with a pseudonormal left ventricular filling pattern, with concomitant abnormal relaxation and increased filling pressure (grade 2 diastolic dysfunction). - Aortic valve: There was no stenosis. - Mitral valve: Trivial regurgitation. - Left atrium: The atrium was mildly dilated. - Right ventricle: The cavity size was normal. Systolic function was normal. - Right atrium: The atrium was mildly dilated. - Tricuspid valve: Peak RV-RA gradient: 61mm Hg (S). - Pulmonary arteries: PA peak pressure: 30mm Hg (S). - Inferior vena cava: The vessel was normal in size; the respirophasic diameter changes were in the normal range (= 50%); findings are consistent with normal central venous pressure. Impressions:  - Mildly dilated LV with normal systolic function, EF 0000000. Moderate diastolic dysfunction. Normal RV size and systolic function. Mild biatrial enlargement.  Scheduled Meds: .  atorvastatin  10 mg Oral Daily  . calcitRIOL  0.25 mcg Oral Daily  . calcium acetate  1,334 mg Oral TID WC  . dialysis solution 1.5% low-MG/low-CA   Intraperitoneal Once in dialysis  . dialysis solution 1.5% low-MG/low-CA   Intraperitoneal Once in dialysis  . docusate sodium  100 mg Oral BID  . heparin  5,000 Units Subcutaneous Q8H  . insulin aspart  0-9 Units Subcutaneous TID WC  . [START ON 04/04/2013] insulin glargine  16 Units Subcutaneous QAC breakfast  . metoCLOPramide  5 mg Oral TID WC & HS  . pantoprazole  40 mg Oral Daily  . potassium chloride  20 mEq Oral BID  . sertraline  50 mg Oral Daily  .  sodium chloride  3 mL Intravenous Q12H  . sodium chloride  3 mL Intravenous Q12H  . warfarin   Does not apply Once  . Warfarin - Pharmacist Dosing Inpatient   Does not apply q1800   Continuous Infusions: . dialysis solution 1.5% low-MG/low-CA    . dialysis solution 1.5% low-MG/low-CA     Time spent: 35 min  [facility] Pager [phone]. If 7PM-7AM, please contact night-coverage at [email or web address], password [facility] 04/03/2013, 9:29 AM  LOS: 1 day

## 2013-04-03 NOTE — Progress Notes (Signed)
Patient's CBG have been up & patient diabetic.  Spoke with Fredirick Maudlin, NP.  Will add carb mod to his renal diet.

## 2013-04-03 NOTE — Progress Notes (Signed)
ANTICOAGULATION CONSULT NOTE - Follow Up Consult  Pharmacy Consult for warfarin Indication: atrial fibrillation  Allergies  Allergen Reactions  . Metformin And Related Other (See Comments)    Kidney problems   Patient Measurements: Height: 6\' 2"  (188 cm) Weight: 193 lb 12.6 oz (87.9 kg) IBW/kg (Calculated) : 82.2 Vital Signs: Temp: 98.1 F (36.7 C) (11/15 1040) Temp src: Oral (11/15 0800) BP: 134/78 mmHg (11/15 1040) Pulse Rate: 65 (11/15 1040) Labs:  Recent Labs  04/02/13 1201 04/02/13 1620 04/02/13 2015 04/03/13 0225  HGB 10.0*  --   --  8.1*  HCT 29.4*  --   --  23.6*  PLT 200  --   --  192  LABPROT  --   --   --  16.0*  INR  --   --   --  1.31  CREATININE 9.62*  --   --  10.13*  TROPONINI  --  <0.30 <0.30 <0.30   Estimated Creatinine Clearance: 9.1 ml/min (by C-G formula based on Cr of 10.13).   Medications:  Coumadin dosed daily  Assessment: 64yom with ESRD currently on PD, admitted from ED today with new onset AFib but converted quickly after diltiazem drip initiation starting on warfarin. INR 1.31 after 1 dose of 5mg . Hgb drop to 8.1, plts 192.   Goal of Therapy:  INR 2-3 Monitor platelets by anticoagulation protocol: Yes   Plan:  1. Warfarin 5mg  po x1 again tonight.  2. Daily PT/INR.   Sloan Leiter, PharmD, BCPS Clinical Pharmacist [phone] 04/03/2013,11:20 AM

## 2013-04-03 NOTE — Procedures (Signed)
I was present at this dialysis session. I have reviewed the session itself and made appropriate changes.   Cycler didn't work overnight; did manual exchanges.  Weigh this AM, dry, is 87.9,kg.  Pt w/o SOB, LEE.  No wrr on exam.  Feels much improved.  In NSR overnight.  Used all 1.5% yesterday.  On warfarin for anticoaguation.    Will repeat same PD regimen today.  Use 1.5%.  Hopeful cycler will work but if not can do manual exchanges.  K 3.4 on 20 PO BID KCl.  EDW of 88kg seems reasonable: normotensive, no edema, no pulmonary symptoms.    Filed Vitals:   04/03/13 0600 04/03/13 0615 04/03/13 0645 04/03/13 0800  BP: 120/59     Pulse:      Temp:    97.6 F (36.4 C)  TempSrc:    Oral  Resp: 12     Height:      Weight:  90.6 kg (199 lb 11.8 oz) 87.9 kg (193 lb 12.6 oz)   SpO2:    93%       Pearson Grippe  MD 04/03/2013, 9:51 AM

## 2013-04-04 DIAGNOSIS — E876 Hypokalemia: Secondary | ICD-10-CM | POA: Diagnosis present

## 2013-04-04 LAB — RENAL FUNCTION PANEL
Albumin: 2 g/dL — ABNORMAL LOW (ref 3.5–5.2)
BUN: 83 mg/dL — ABNORMAL HIGH (ref 6–23)
CO2: 22 mEq/L (ref 19–32)
Calcium: 7.5 mg/dL — ABNORMAL LOW (ref 8.4–10.5)
Chloride: 97 mEq/L (ref 96–112)
Creatinine, Ser: 10.64 mg/dL — ABNORMAL HIGH (ref 0.50–1.35)
Glucose, Bld: 136 mg/dL — ABNORMAL HIGH (ref 70–99)

## 2013-04-04 LAB — GLUCOSE, CAPILLARY: Glucose-Capillary: 139 mg/dL — ABNORMAL HIGH (ref 70–99)

## 2013-04-04 LAB — PROTIME-INR
INR: 1.43 (ref 0.00–1.49)
Prothrombin Time: 17.1 seconds — ABNORMAL HIGH (ref 11.6–15.2)

## 2013-04-04 MED ORDER — MAGNESIUM OXIDE 400 MG PO TABS: 400.0000 mg | ORAL_TABLET | Freq: Two times a day (BID) | ORAL | Status: DC

## 2013-04-04 MED ORDER — PNEUMOCOCCAL VAC POLYVALENT 25 MCG/0.5ML IJ INJ
0.5000 mL | INJECTION | Freq: Once | INTRAMUSCULAR | Status: AC
  Administered 2013-04-04: 0.5 mL via INTRAMUSCULAR
  Filled 2013-04-04: qty 0.5

## 2013-04-04 MED ORDER — WARFARIN SODIUM 5 MG PO TABS
5.0000 mg | ORAL_TABLET | Freq: Once | ORAL | Status: DC
  Filled 2013-04-04: qty 1

## 2013-04-04 MED ORDER — POTASSIUM CHLORIDE CRYS ER 20 MEQ PO TBCR: 20.0000 meq | EXTENDED_RELEASE_TABLET | Freq: Two times a day (BID) | ORAL | Status: DC

## 2013-04-04 MED ORDER — WARFARIN SODIUM 5 MG PO TABS: 5.0000 mg | ORAL_TABLET | Freq: Every day | ORAL | Status: DC

## 2013-04-04 NOTE — Progress Notes (Signed)
Discussed discharge instructions and medications with patient.  Patient showed no barriers to discharge.  Assessment unchanged from morning.  IV removed.  Tele removed.  Patient discharged to home with wife.

## 2013-04-04 NOTE — Procedures (Signed)
I was present at this dialysis session. I have reviewed the session itself and made appropriate changes.   Dry weight this AM 86.5kg.  Pt ambulating and feels well.  No complaints and ready to go home.  Labs stable.  No technical issues.  No abd pain.  Moving bowels.  Pt ready for dc from renal perspective.    Lab Results  Component Value Date   CREATININE 10.64* 04/04/2013   BUN 83* 04/04/2013   NA 139 04/04/2013   K 3.4* 04/04/2013   CL 97 04/04/2013   CO2 22 04/04/2013     Filed Weights   04/03/13 0645 04/03/13 2129 04/04/13 0943  Weight: 87.9 kg (193 lb 12.6 oz) 89.858 kg (198 lb 1.6 oz) 86.5 kg (190 lb 11.2 oz)    Pearson Grippe  MD 04/04/2013, 10:10 AM

## 2013-04-04 NOTE — Discharge Summary (Signed)
Physician Discharge Summary  Paul [name] [id number] DOB: November 08, 1955 DOA: 04/02/2013  PCP: [name], MD  Admit date: 04/02/2013 Discharge date: 04/04/2013  Time spent: greater than 30 minutes  Recommendations for Outpatient Follow-up:  1. Monitor INR.    Discharge Diagnoses:  Principal Problem:   Atrial fibrillation Active Problems:   End stage renal disease   Anemia   DM (diabetes mellitus)   Hypotension   Depression   GERD (gastroesophageal reflux disease) hypokalemia  Discharge Condition: stable  Filed Weights   04/03/13 0645 04/03/13 2129 04/04/13 0943  Weight: 87.9 kg (193 lb 12.6 oz) 89.858 kg (198 lb 1.6 oz) 86.5 kg (190 lb 11.2 oz)    History of present illness:  57 y.o. male with PMH significant for Diabetes, ESRD on peritoneal dialysis, history of duodenal cancer who presents to the ED complaining of dizziness, weakness that started last Monday. He relates weakness, dizziness got worse today. His BP at home was also low. He has notices palpitation. He denies fever, cough, diarrhea, abdominal pain, chest pain or dyspnea. He does relates nausea and occasional vomiting since he has had renal failure. He does not appears to be toxic.  He was found to be in A fib RVR, Hr in the 140 range, SBP in the 70. He received 15 mg of of IV Cardizem. His HR rate is now in the 70, sinus.   Hospital Course:  Admitted to step down. No further atrial fibrillation or hypotension.  Cardiology and nephrology consulted.  No evidence of peritonitis clinically or peritoneal fluid cell count.  Antibiotics stopped.  TSH normal  Echo shows preserved EF.  Cardiology recommends coumadin for CHADs score 3.  Peritoneal dialysis continued.  Hypokalemia corrected.  Feels well, vital signs stable, cleared for discharge by consultants.  Cardiology will arrange coumadin clinic follow up  Procedures:  Peritoneal dialysis  Consultations:  Nephrology  cardiology  Discharge Exam: Filed  Vitals:   04/04/13 1022  BP: 130/66  Pulse: 64  Temp: 98.3 F (36.8 C)  Resp: 18    General: comfortable Cardiovascular: RRR Respiratory: cta bilaterally Ext: no CCE Discharge Instructions  Discharge Orders   Future Orders Complete By Expires   Activity as tolerated - No restrictions  As directed    Discharge instructions  As directed    Comments:     RENAL DIET       Medication List         aspirin EC 81 MG tablet  Take 81 mg by mouth daily.     atorvastatin 10 MG tablet  Commonly known as:  LIPITOR  Take 10 mg by mouth daily.     calcitRIOL 0.25 MCG capsule  Commonly known as:  ROCALTROL  Take 0.25 mcg by mouth daily.     calcium acetate 667 MG capsule  Commonly known as:  PHOSLO  Take 667 mg by mouth 2 (two) times daily.     cholecalciferol 1000 UNITS tablet  Commonly known as:  VITAMIN D  Take 1,000 Units by mouth daily.     cyclobenzaprine 10 MG tablet  Commonly known as:  FLEXERIL  Take 10 mg by mouth at bedtime.     EPOGEN IJ  Inject as directed 3 (three) times a week. Monday, Wednesday, friday     epoetin alfa 10000 UNIT/ML injection  Commonly known as:  EPOGEN,PROCRIT  10,000 Units every Monday, Wednesday, and Friday.     insulin glargine 100 UNIT/ML injection  Commonly known as:  LANTUS  Inject  20-27 Units into the skin every morning.     magnesium oxide 400 MG tablet  Commonly known as:  MAG-OX  Take 1 tablet (400 mg total) by mouth 2 (two) times daily. UNTIL GONE     metoCLOPramide 5 MG tablet  Commonly known as:  REGLAN  Take 5 mg by mouth 4 (four) times daily -  with meals and at bedtime.     omeprazole 40 MG capsule  Commonly known as:  PRILOSEC  Take 40 mg by mouth daily.     ONE TOUCH ULTRA TEST test strip  Generic drug:  glucose blood     potassium chloride SA 20 MEQ tablet  Commonly known as:  K-DUR,KLOR-CON  Take 1 tablet (20 mEq total) by mouth 2 (two) times daily. UNTIL GONE     sertraline 50 MG tablet  Commonly  known as:  ZOLOFT  Take 50 mg by mouth daily.     warfarin 5 MG tablet  Commonly known as:  COUMADIN  Take 1 tablet (5 mg total) by mouth daily at 6 PM. OR AS DIRECTED       Allergies  Allergen Reactions  . Metformin And Related Other (See Comments)    Kidney problems       Follow-up Information   Follow up with [facility]. (THIS WEEK. THEIR OFFICE WILL CALL YOU WITH APPOINTMENT DETAILS)    Contact information:   [place] [address]        The results of significant diagnostics from this hospitalization (including imaging, microbiology, ancillary and laboratory) are listed below for reference.    Significant Diagnostic Studies: Dg Chest Port 1 View  04/02/2013   CLINICAL DATA:  Hypotension.  EXAM: PORTABLE CHEST - 1 VIEW  COMPARISON:  06/18/2012.  FINDINGS: Interim removal of dialysis port. Mediastinum and hilar structures are normal. The lungs are clear. No pleural effusion or pneumothorax. COPD. Cardiomegaly, no CHF. No acute bony abnormality.  IMPRESSION: Interim removal of dialysis port. No acute cardiopulmonary disease. Stable cardiomegaly, no CHF.   Electronically Signed   By: Marcello Moores  Register   On: 04/02/2013 13:39   Echo Left ventricle: The cavity size was mildly dilated. Wall thickness was normal. Systolic function was normal. The estimated ejection fraction was in the range of 55% to 60%. Wall motion was normal; there were no regional wall motion abnormalities. Features are consistent with a pseudonormal left ventricular filling pattern, with concomitant abnormal relaxation and increased filling pressure (grade 2 diastolic dysfunction). - Aortic valve: There was no stenosis. - Mitral valve: Trivial regurgitation. - Left atrium: The atrium was mildly dilated. - Right ventricle: The cavity size was normal. Systolic function was normal. - Right atrium: The atrium was mildly dilated. -  Tricuspid valve: Peak RV-RA gradient: 51mm Hg (S). - Pulmonary arteries: PA peak pressure: 10mm Hg (S). - Inferior vena cava: The vessel was normal in size; the respirophasic diameter changes were in the normal range (= 50%); findings are consistent with normal central venous pressure. Impressions:  - Mildly dilated LV with normal systolic function, EF 0000000. Moderate diastolic dysfunction. Normal RV size and systolic function. Mild biatrial enlargement.  EKG Atrial flutter with predominant 2:1 AV block Anteroseptal infarct, old Borderline ST depression, lateral leads Microbiology: Recent Results (from the past 240 hour(s))  BODY FLUID CULTURE     Status: None   Collection Time    04/02/13  1:30 PM      Result Value  Range Status   Specimen Description PERITONEAL   Final   Special Requests Normal   Final   Gram Stain     Final   Value: NO WBC SEEN     NO ORGANISMS SEEN     Performed at [facility]   Culture     Final   Value: NO GROWTH 1 DAY     Performed at [facility]   Report Status PENDING   Incomplete  MRSA PCR SCREENING     Status: None   Collection Time    04/02/13  3:31 PM      Result Value Range Status   MRSA by PCR NEGATIVE  NEGATIVE Final   Comment:            The GeneXpert MRSA Assay (FDA     approved for NASAL specimens     only), is one component of a     comprehensive MRSA colonization     surveillance program. It is not     intended to diagnose MRSA     infection nor to guide or     monitor treatment for     MRSA infections.  CULTURE, BLOOD (ROUTINE X 2)     Status: None   Collection Time    04/02/13  4:10 PM      Result Value Range Status   Specimen Description BLOOD LEFT ARM   Final   Special Requests BOTTLES DRAWN AEROBIC ONLY 6CC   Final   Culture  Setup Time     Final   Value: 04/02/2013 21:33     Performed at [facility]   Culture     Final   Value:        BLOOD CULTURE RECEIVED NO GROWTH TO DATE CULTURE WILL BE  HELD FOR 5 DAYS BEFORE ISSUING A FINAL NEGATIVE REPORT     Performed at [facility]   Report Status PENDING   Incomplete  CULTURE, BLOOD (ROUTINE X 2)     Status: None   Collection Time    04/02/13  4:20 PM      Result Value Range Status   Specimen Description BLOOD LEFT HAND   Final   Special Requests BOTTLES DRAWN AEROBIC ONLY 6CC   Final   Culture  Setup Time     Final   Value: 04/02/2013 21:33     Performed at [facility]   Culture     Final   Value:        BLOOD CULTURE RECEIVED NO GROWTH TO DATE CULTURE WILL BE HELD FOR 5 DAYS BEFORE ISSUING A FINAL NEGATIVE REPORT     Performed at [facility]   Report Status PENDING   Incomplete     Labs: Basic Metabolic Panel:  Recent Labs Lab 04/02/13 1201 04/03/13 0225 04/04/13 0436  NA 138 138 139  K 3.3* 3.4* 3.4*  CL 96 97 97  CO2 18* 22 22  GLUCOSE 158* 138* 136*  BUN 86* 90* 83*  CREATININE 9.62* 10.13* 10.64*  CALCIUM 7.2* 7.3* 7.5*  MG  --  1.5  --   PHOS  --   --  8.8*   Liver Function Tests:  Recent Labs Lab 04/02/13 1201 04/04/13 0436  AST 11  --   ALT 10  --   ALKPHOS 61  --   BILITOT 0.5  --   PROT 5.8*  --   ALBUMIN 2.4* 2.0*   No results found  for this basename: LIPASE, AMYLASE,  in the last 168 hours No results found for this basename: AMMONIA,  in the last 168 hours CBC:  Recent Labs Lab 04/02/13 1201 04/03/13 0225  WBC 19.4* 9.8  NEUTROABS 17.0*  --   HGB 10.0* 8.1*  HCT 29.4* 23.6*  MCV 89.9 89.1  PLT 200 192   Cardiac Enzymes:  Recent Labs Lab 04/02/13 1620 04/02/13 2015 04/03/13 0225  TROPONINI <0.30 <0.30 <0.30   BNP: BNP (last 3 results)  Recent Labs  04/02/13 1217  PROBNP 12548.0*   CBG:  Recent Labs Lab 04/03/13 1256 04/03/13 1732 04/03/13 2129 04/04/13 0751 04/04/13 1155  GLUCAP 169* 170* 171* 139* 140*       Signed:  Leida Luton L  [facility] 04/04/2013, 1:14 PM

## 2013-04-04 NOTE — Progress Notes (Signed)
ANTICOAGULATION CONSULT NOTE - Follow Up Consult  Pharmacy Consult for warfarin Indication: atrial fibrillation  Allergies  Allergen Reactions  . Metformin And Related Other (See Comments)    Kidney problems   Patient Measurements: Height: 6\' 2"  (188 cm) Weight: 190 lb 11.2 oz (86.5 kg) IBW/kg (Calculated) : 82.2 Vital Signs: Temp: 98.3 F (36.8 C) (11/16 1022) Temp src: Oral (11/16 1022) BP: 130/66 mmHg (11/16 1022) Pulse Rate: 64 (11/16 1022) Labs:  Recent Labs  04/02/13 1201 04/02/13 1620 04/02/13 2015 04/03/13 0225 04/04/13 0436  HGB 10.0*  --   --  8.1*  --   HCT 29.4*  --   --  23.6*  --   PLT 200  --   --  192  --   LABPROT  --   --   --  16.0* 17.1*  INR  --   --   --  1.31 1.43  CREATININE 9.62*  --   --  10.13* 10.64*  TROPONINI  --  <0.30 <0.30 <0.30  --    Estimated Creatinine Clearance: 8.7 ml/min (by C-G formula based on Cr of 10.64).   Medications:  Coumadin dosed daily  Assessment: 76yom with ESRD currently on PD, admitted from ED today with new onset AFib but converted quickly after diltiazem drip initiation starting on warfarin. INR 1.43 (rising) after 2 doses of 5mg . Hgb drop to 8.1, plts 192. No bleeding recorded.    Goal of Therapy:  INR 2-3 Monitor platelets by anticoagulation protocol: Yes   Plan:  1. Warfarin 5mg  po x1 again tonight.  2. Daily PT/INR.   Sloan Leiter, PharmD, BCPS Clinical Pharmacist [phone] 04/04/2013,11:44 AM

## 2013-04-05 LAB — BODY FLUID CULTURE: Special Requests: NORMAL

## 2013-04-08 LAB — CULTURE, BLOOD (ROUTINE X 2): Culture: NO GROWTH

## 2013-04-09 ENCOUNTER — Ambulatory Visit (INDEPENDENT_AMBULATORY_CARE_PROVIDER_SITE_OTHER): Payer: Managed Care, Other (non HMO) | Admitting: General Practice

## 2013-04-09 DIAGNOSIS — I4891 Unspecified atrial fibrillation: Secondary | ICD-10-CM

## 2013-04-09 DIAGNOSIS — Z7901 Long term (current) use of anticoagulants: Secondary | ICD-10-CM

## 2013-04-09 LAB — POCT INR: INR: 3

## 2013-04-09 NOTE — Patient Instructions (Signed)

## 2013-04-13 ENCOUNTER — Ambulatory Visit (INDEPENDENT_AMBULATORY_CARE_PROVIDER_SITE_OTHER): Payer: Managed Care, Other (non HMO) | Admitting: Pharmacist

## 2013-04-13 DIAGNOSIS — I4891 Unspecified atrial fibrillation: Secondary | ICD-10-CM

## 2013-04-13 DIAGNOSIS — Z7901 Long term (current) use of anticoagulants: Secondary | ICD-10-CM

## 2013-04-13 LAB — POCT INR: INR: 3.6

## 2013-04-16 ENCOUNTER — Emergency Department (HOSPITAL_COMMUNITY): Payer: Managed Care, Other (non HMO)

## 2013-04-16 ENCOUNTER — Encounter (HOSPITAL_COMMUNITY): Payer: Self-pay | Admitting: Emergency Medicine

## 2013-04-16 ENCOUNTER — Emergency Department (HOSPITAL_COMMUNITY)
Admission: EM | Admit: 2013-04-16 | Discharge: 2013-04-16 | Disposition: A | Discharge status: Home / Self Care | Payer: Managed Care, Other (non HMO) | Attending: Emergency Medicine | Admitting: Emergency Medicine

## 2013-04-16 DIAGNOSIS — Z992 Dependence on renal dialysis: Secondary | ICD-10-CM | POA: Insufficient documentation

## 2013-04-16 DIAGNOSIS — E1149 Type 2 diabetes mellitus with other diabetic neurological complication: Secondary | ICD-10-CM | POA: Insufficient documentation

## 2013-04-16 DIAGNOSIS — R5381 Other malaise: Secondary | ICD-10-CM | POA: Insufficient documentation

## 2013-04-16 DIAGNOSIS — R9431 Abnormal electrocardiogram [ECG] [EKG]: Secondary | ICD-10-CM

## 2013-04-16 DIAGNOSIS — E1142 Type 2 diabetes mellitus with diabetic polyneuropathy: Secondary | ICD-10-CM | POA: Insufficient documentation

## 2013-04-16 DIAGNOSIS — I4581 Long QT syndrome: Secondary | ICD-10-CM | POA: Insufficient documentation

## 2013-04-16 DIAGNOSIS — I4891 Unspecified atrial fibrillation: Secondary | ICD-10-CM | POA: Insufficient documentation

## 2013-04-16 DIAGNOSIS — N049 Nephrotic syndrome with unspecified morphologic changes: Secondary | ICD-10-CM | POA: Insufficient documentation

## 2013-04-16 DIAGNOSIS — F329 Major depressive disorder, single episode, unspecified: Secondary | ICD-10-CM | POA: Insufficient documentation

## 2013-04-16 DIAGNOSIS — Z862 Personal history of diseases of the blood and blood-forming organs and certain disorders involving the immune mechanism: Secondary | ICD-10-CM | POA: Insufficient documentation

## 2013-04-16 DIAGNOSIS — F3289 Other specified depressive episodes: Secondary | ICD-10-CM | POA: Insufficient documentation

## 2013-04-16 DIAGNOSIS — Z8509 Personal history of malignant neoplasm of other digestive organs: Secondary | ICD-10-CM | POA: Insufficient documentation

## 2013-04-16 DIAGNOSIS — R011 Cardiac murmur, unspecified: Secondary | ICD-10-CM | POA: Insufficient documentation

## 2013-04-16 DIAGNOSIS — I120 Hypertensive chronic kidney disease with stage 5 chronic kidney disease or end stage renal disease: Secondary | ICD-10-CM | POA: Insufficient documentation

## 2013-04-16 DIAGNOSIS — N186 End stage renal disease: Secondary | ICD-10-CM | POA: Insufficient documentation

## 2013-04-16 DIAGNOSIS — E860 Dehydration: Secondary | ICD-10-CM | POA: Insufficient documentation

## 2013-04-16 DIAGNOSIS — Z7982 Long term (current) use of aspirin: Secondary | ICD-10-CM | POA: Insufficient documentation

## 2013-04-16 DIAGNOSIS — Z8781 Personal history of (healed) traumatic fracture: Secondary | ICD-10-CM | POA: Insufficient documentation

## 2013-04-16 DIAGNOSIS — Z794 Long term (current) use of insulin: Secondary | ICD-10-CM | POA: Insufficient documentation

## 2013-04-16 DIAGNOSIS — Z79899 Other long term (current) drug therapy: Secondary | ICD-10-CM | POA: Insufficient documentation

## 2013-04-16 DIAGNOSIS — Z87828 Personal history of other (healed) physical injury and trauma: Secondary | ICD-10-CM | POA: Insufficient documentation

## 2013-04-16 DIAGNOSIS — E1129 Type 2 diabetes mellitus with other diabetic kidney complication: Secondary | ICD-10-CM | POA: Insufficient documentation

## 2013-04-16 DIAGNOSIS — K219 Gastro-esophageal reflux disease without esophagitis: Secondary | ICD-10-CM | POA: Insufficient documentation

## 2013-04-16 DIAGNOSIS — Z7901 Long term (current) use of anticoagulants: Secondary | ICD-10-CM | POA: Insufficient documentation

## 2013-04-16 DIAGNOSIS — R42 Dizziness and giddiness: Secondary | ICD-10-CM | POA: Insufficient documentation

## 2013-04-16 HISTORY — DX: Unspecified atrial fibrillation: I48.91

## 2013-04-16 LAB — COMPREHENSIVE METABOLIC PANEL
AST: 11 U/L (ref 0–37)
BUN: 79 mg/dL — ABNORMAL HIGH (ref 6–23)
CO2: 21 mEq/L (ref 19–32)
Calcium: 7.3 mg/dL — ABNORMAL LOW (ref 8.4–10.5)
Chloride: 96 mEq/L (ref 96–112)
Creatinine, Ser: 9.23 mg/dL — ABNORMAL HIGH (ref 0.50–1.35)
GFR calc non Af Amer: 5 mL/min — ABNORMAL LOW (ref >90)
Total Bilirubin: 0.5 mg/dL (ref 0.3–1.2)
Total Protein: 5.8 g/dL — ABNORMAL LOW (ref 6.0–8.3)

## 2013-04-16 LAB — PROTIME-INR: INR: 2.47 — ABNORMAL HIGH (ref 0.00–1.49)

## 2013-04-16 LAB — CBC WITH DIFFERENTIAL/PLATELET
Basophils Absolute: 0 10*3/uL (ref 0.0–0.1)
Basophils Relative: 0 % (ref 0–1)
Eosinophils Absolute: 0.3 10*3/uL (ref 0.0–0.7)
Eosinophils Relative: 3 % (ref 0–5)
HCT: 25.4 % — ABNORMAL LOW (ref 39.0–52.0)
Lymphocytes Relative: 14 % (ref 12–46)
MCH: 31.1 pg (ref 26.0–34.0)
MCHC: 33.1 g/dL (ref 30.0–36.0)
MCV: 94.1 fL (ref 78.0–100.0)
Monocytes Absolute: 1.4 10*3/uL — ABNORMAL HIGH (ref 0.1–1.0)
Platelets: 197 10*3/uL (ref 150–400)
RDW: 13.9 % (ref 11.5–15.5)

## 2013-04-16 LAB — APTT: aPTT: 40 seconds — ABNORMAL HIGH (ref 24–37)

## 2013-04-16 LAB — OCCULT BLOOD, POC DEVICE: Fecal Occult Bld: NEGATIVE

## 2013-04-16 MED ORDER — SODIUM CHLORIDE 0.9 % IV BOLUS (SEPSIS)
1000.0000 mL | Freq: Once | INTRAVENOUS | Status: AC
  Administered 2013-04-16: 1000 mL via INTRAVENOUS

## 2013-04-16 MED ORDER — ONDANSETRON HCL 4 MG/2ML IJ SOLN
4.0000 mg | Freq: Once | INTRAMUSCULAR | Status: AC
  Administered 2013-04-16: 4 mg via INTRAVENOUS
  Filled 2013-04-16: qty 2

## 2013-04-16 NOTE — ED Provider Notes (Signed)
  Face-to-face evaluation   History: He is here for evaluation of some dizziness associated with low blood pressure taken at home.  Physical exam: He appears chronically ill. Mucous membranes are moist. Heart regular rate and rhythm. No murmur. Lungs clear to auscultation. Abdomen soft and nontender  Assessment: Clinical appearance of dehydration with renal failure and stable chronically low hemoglobin. He was treated with IV fluids in the emergency department.  Consultation: 14:45- case discussed with nephrology. Dr. Posey Pronto recommended that he decrease his diastole solutions to 1.5%, avoiding the 2.5%. Patient is currently using one to 2.5% each day. Findings discussed with patient and family and all questions were answered.  Medical screening examination/treatment/procedure(s) were conducted as a shared visit with non-physician practitioner(s) and myself.  I personally evaluated the patient during the encounter  Richarda Blade, MD 04/22/13 [phone]

## 2013-04-16 NOTE — ED Provider Notes (Signed)
CSN: [id number]     Arrival date & time 04/16/13  1201 History   First MD Initiated Contact with Patient 04/16/13 1239     Chief Complaint  Patient presents with  . Hypotension   (Consider location/radiation/quality/duration/timing/severity/associated sxs/prior Treatment) The history is provided by the patient and the spouse. No language interpreter was used.  Paul [name] is a 57 y/o M with PMHx of chronic kidney disease currently on peritoneal dialysis, DM, HTN, a-fib recently diagnosed this month and started on Coumadin, depression presenting to the ED with an episode of hypotension that occurred at approximately 10:00AM this morning. As per patient, reported that when he woke up this morning he felt fine, reported that he he felt mildly dizzy at approximately 10:00AM - wife reported that they took the patient's blood pressure and the blood pressure was 70/48 when standing up with a heart rate of 79 bpm, 91/55 when sitting with a heart rate of 72 bpm. Patient reported that at this moment he felt like he was going to faint, but did not. Patient reported that this was only one incidence. Patient reported that he has been feeling weak throughout the day. Patient reported that he has chronic kidney disease and performs his own peritoneal dialysis at home. Denied chest pain, shortness of breath, difficulty breathing, neck pain, back pain, abdominal pain, melena, hematochezia, cough, congestion, chills, sweating, fever.  PCP Dr. Vernie Shanks Nephrologist Dr. Lorrene Reid Cardiology Dr. Martinique   Past Medical History  Diagnosis Date  . Diabetic neuropathy   . Cancer     History of Duodenal Cancer  . Chronic kidney disease     Pt is not eligible for transplant per [facility]  . Diabetes mellitus     Type 2 iddm x 11 yrs  . Anemia     esrd  . Hypertension     meds d/c 'd  2 wks ago for postural hypotension  . Fatigue      tires easily x 2 months  . Chronic nausea     with  morning vomiting; better off  HTN  meds  . Intermittent tremor   . Closed left arm fracture 1967  . Heart murmur   . Corneal abrasion, left   . Shortness of breath     exertional  . GERD (gastroesophageal reflux disease)   . History of blood transfusion   . Lack of coordination   . Muscle weakness (generalized)   . Glomerulosclerosis, diabetic   . Depression   . A-fib    Past Surgical History  Procedure Laterality Date  . Whipple procedure  2002    duodenal ca  . Bone marrow biopsy  2012  . Renal biopsy, percutaneous  2012  . Tonsillectomy    . Portacath placement  2002  . Biliary diversion, external  2002  . Other surgical history      Cyst removed from back  . Av fistula placement  06/07/2011    Procedure: ARTERIOVENOUS (AV) FISTULA CREATION;  Surgeon: Angelia Mould, MD;  Location: [place];  Service: Vascular;  Laterality: Left;  . Ligation of arteriovenous  fistula  05/22/2012    Procedure: LIGATION OF ARTERIOVENOUS  FISTULA;  Surgeon: Mal Misty, MD;  Location: [place];  Service: Vascular;  Laterality: Left;  Left brachio-cephalic fistula  . Insertion of dialysis catheter  05/22/2012    Procedure: INSERTION OF DIALYSIS CATHETER;  Surgeon: Mal Misty, MD;  Location: [place];  Service: Vascular;  Laterality: N/A;  right  internal jugular vein  . Av fistula placement  06/18/2012    Procedure: ARTERIOVENOUS (AV) FISTULA CREATION;  Surgeon: Mal Misty, MD;  Location: [place];  Service: Vascular;  Laterality: Right;  [place]  . Colonoscopy      Hx: of  . Capd insertion N/A 01/14/2013    Procedure: LAPAROSCOPIC INSERTION CONTINUOUS AMBULATORY PERITONEAL DIALYSIS  (CAPD) CATHETER;  Surgeon: Adin Hector, MD;  Location: [facility];  Service: General;  Laterality: N/A;   Family History  Problem Relation Age of Onset  . Cancer Mother   . Cancer Father     prostate cancer  . Heart disease Maternal Grandfather   . Stroke Paternal Grandmother    History  Substance Use Topics  . Smoking status: Never Smoker   .  Smokeless tobacco: Never Used  . Alcohol Use: No    Review of Systems  Constitutional: Negative for fever and chills.  HENT: Negative for trouble swallowing.   Respiratory: Negative for chest tightness and shortness of breath.   Cardiovascular: Negative for chest pain.  Gastrointestinal: Negative for nausea, vomiting, abdominal pain, diarrhea, blood in stool and anal bleeding.  Musculoskeletal: Negative for back pain and neck pain.  Neurological: Positive for dizziness and weakness. Negative for numbness.  All other systems reviewed and are negative.    Allergies  Metformin and related  Home Medications   Current Outpatient Rx  Name  Route  Sig  Dispense  Refill  . aspirin EC 81 MG tablet   Oral   Take 81 mg by mouth daily.         [name] atorvastatin (LIPITOR) 10 MG tablet   Oral   Take 10 mg by mouth daily.          . calcitRIOL (ROCALTROL) 0.25 MCG capsule   Oral   Take 0.25 mcg by mouth daily.          . calcium acetate (PHOSLO) 667 MG capsule   Oral   Take 667 mg by mouth 3 (three) times daily with meals.          . cholecalciferol (VITAMIN D) 1000 UNITS tablet   Oral   Take 1,000 Units by mouth daily.         . cyclobenzaprine (FLEXERIL) 10 MG tablet   Oral   Take 10 mg by mouth at bedtime.         . insulin glargine (LANTUS) 100 UNIT/ML injection   Subcutaneous   Inject 20 Units into the skin every morning.          . metoCLOPramide (REGLAN) 5 MG tablet   Oral   Take 5 mg by mouth 4 (four) times daily -  with meals and at bedtime.          [name] omeprazole (PRILOSEC) 40 MG capsule   Oral   Take 40 mg by mouth daily.         . potassium chloride SA (K-DUR,KLOR-CON) 20 MEQ tablet   Oral   Take 20 mEq by mouth 2 (two) times daily. UNTIL GONE         . sertraline (ZOLOFT) 50 MG tablet   Oral   Take 50 mg by mouth daily.          [name] warfarin (COUMADIN) 5 MG tablet   Oral   Take 2.5-5 mg by mouth daily at 6 PM. Take 2.5 mg on Sunday.   Take 5 mg on all other days.         [name]  epoetin alfa (EPOGEN,PROCRIT) 16109 UNIT/ML injection   Subcutaneous   Inject 10,000 Units into the skin every Monday, Wednesday, and Friday.           BP 117/66  Pulse 66  Temp(Src) 98.6 F (37 C) (Oral)  Resp 13  Ht 6\' 2"  (1.88 m)  Wt 190 lb (86.183 kg)  BMI 24.38 kg/m2  SpO2 98% Physical Exam  Nursing note and vitals reviewed. Constitutional: He is oriented to person, place, and time. He appears well-developed and well-nourished. No distress.  Non-diaphoretic  HENT:  Head: Normocephalic and atraumatic.  Mouth/Throat: Oropharynx is clear and moist. No oropharyngeal exudate.  Eyes: Conjunctivae and EOM are normal. Pupils are equal, round, and reactive to light. Right eye exhibits no discharge. Left eye exhibits no discharge.  Neck: Normal range of motion. Neck supple.  Negative neck stiffness Negative nuchal rigidity Negative cervical lymphadenopathy  Cardiovascular: Normal rate, regular rhythm and normal heart sounds.   Pulses:      Radial pulses are 2+ on the left side.       Dorsalis pedis pulses are 2+ on the right side, and 2+ on the left side.  AV fistula placement in right forearm.  Negative bilateral lower extremity edema noted, negative pitting edema noted  Pulmonary/Chest: Effort normal and breath sounds normal. No respiratory distress. He has no wheezes. He has no rales.  Abdominal: Soft. Bowel sounds are normal. He exhibits no distension. There is no tenderness. There is no guarding.  Negative findings associated with ascites Portacath for dialysis placed in abdomen on the right side of the abdomen.  Musculoskeletal: Normal range of motion.  Lymphadenopathy:    He has no cervical adenopathy.  Neurological: He is alert and oriented to person, place, and time. No cranial nerve deficit. He exhibits normal muscle tone. Coordination normal.  Strength 5+/5+ to upper and lower extremities bilaterally with resistance applied,  equal distribution noted  Skin: Skin is warm and dry. No rash noted. He is not diaphoretic. No erythema. There is pallor.  Psychiatric: He has a normal mood and affect. His behavior is normal. Thought content normal.    ED Course  Procedures (including critical care time)  This provider reviewed the patient's chart - patient was seen in the ED on 04/02/2013 for dizziness where he was diagnosed with Afib and started on anticoagulation therapy.   2:17 PM Attending physician, Dr. Crosby Oyster, saw and assessed patient.   2:40 PM Dr. Crosby Oyster speaking with nephrologist, Dr. Posey Pronto - Dr. Posey Pronto recommended no further assessment be performed. Recommended that patient be discharged and to follow-up with Dr. Lorrene Reid as outpatient.   Labs Review Labs Reviewed  CBC WITH DIFFERENTIAL - Abnormal; Notable for the following:    WBC 10.6 (*)    RBC 2.70 (*)    Hemoglobin 8.4 (*)    HCT 25.4 (*)    Monocytes Relative 13 (*)    Monocytes Absolute 1.4 (*)    All other components within normal limits  COMPREHENSIVE METABOLIC PANEL - Abnormal; Notable for the following:    Glucose, Bld 176 (*)    BUN 79 (*)    Creatinine, Ser 9.23 (*)    Calcium 7.3 (*)    Total Protein 5.8 (*)    Albumin 2.4 (*)    GFR calc non Af Amer 5 (*)    GFR calc Af Amer 6 (*)    All other components within normal limits  PROTIME-INR - Abnormal; Notable for the following:  Prothrombin Time 25.9 (*)    INR 2.47 (*)    All other components within normal limits  APTT - Abnormal; Notable for the following:    aPTT 40 (*)    All other components within normal limits  GLUCOSE, CAPILLARY - Abnormal; Notable for the following:    Glucose-Capillary 152 (*)    All other components within normal limits  TROPONIN I  MAGNESIUM  OCCULT BLOOD, POC DEVICE   Imaging Review Dg Chest 2 View  04/16/2013   CLINICAL DATA:  Dizziness, hypotension, and history of atrial fibrillation.  EXAM: CHEST  2 VIEW  COMPARISON:  April 02, 2013.   FINDINGS: The lungs are adequately inflated and clear. There is no pleural effusion or pneumothorax. The cardiopericardial silhouette is normal in size. The pulmonary vascularity is not engorged. The mediastinum is normal in width. The observed portions of the bony thorax appear normal.  IMPRESSION: There is no evidence of active cardiopulmonary disease.   Electronically Signed   By: Braelon  Martinique   On: 04/16/2013 13:48    EKG Interpretation    Date/Time:  Friday April 16 2013 14:00:13 EST Ventricular Rate:  71 PR Interval:  162 QRS Duration: 84 QT Interval:  592 QTC Calculation: 643 R Axis:   -15 Text Interpretation:  Sinus rhythm Borderline left axis deviation Low voltage, precordial leads Borderline T abnormalities, diffuse leads Prolonged QT interval Since last tracing QT has lengthened Confirmed by WENTZ  MD, ELLIOTT ([id number]) on 04/16/2013 2:29:21 PM            MDM   1. Dehydration   2. QT prolongation     Medications  sodium chloride 0.9 % bolus 1,000 mL (0 mLs Intravenous Stopped 04/16/13 1521)  ondansetron (ZOFRAN) injection 4 mg (4 mg Intravenous Given 04/16/13 1442)   Filed Vitals:   04/16/13 1530 04/16/13 1545 04/16/13 1600 04/16/13 1615  BP: 111/66 112/66 108/65 117/66  Pulse: 62 63 64 66  Temp:      TempSrc:      Resp: 12 17 13 13   Height:      Weight:      SpO2: 96% 95% 95% 98%     Patient presenting to the ED with episode of hypotension that occurred at approximately 10:00AM this morning, one episode, as per patient reported that blood pressure was 70/48 at approximately 10:00AM this morning. Patient reported that he felt extremely dizzy, had the sensation that he was going to faint, but patient did not. Patient reported that this occurred only once. Patient reported that he has chronic kidney disease where he performs peritoneal dialysis at home, patient newly diagnosed with atrial fibrillation and recently started anticoagulation therapy. Denied  fainting, denied syncopal episode.  Alert and oriented. Heart rate and rhythm irregularly irregular. Pale. Non-diaphoretic. Full ROM to upper and lower extremities bilaterally. Pulses palpable and strong, radial and DP bilaterally - AV fistula noted to the right forearm. Strength intact to upper and lower extremities with resistance applied. Patient appears pale. Negative diaphoresis noted.  EKG noted QT prolongation - negative ischemic changes noted. Negative elevation of troponin. CBC noted mild elevated WBC of 10.6 with negative leukocytosis noted. Hgb 8.4 - elevated when compared to Hgb from 1 week ago at 8.1. CMP negative for hyponatremia, hypokalemia. Mild hypocalcemia noted - 7.3. INR, PT, and aPTT increased secondary to patient being on anticoagulation therapy (coumadin). Negative fecal occult. Chest xray negative for acute cardiopulmonary disease. Magnesium within normal limits - 1.7. Orthostatic vitals: laying 125/63,  67 bpm - sitting 117/622, 76 bpm - standing 88/50, 78 bpm. Positive findings from sitting to standing position.  Discussed case with Dr. Crosby Oyster - Dr. Crosby Oyster saw and assess patient - recommended patient to be given fluids IV. Recommended magnesium to be ordered. Dr. Crosby Oyster spoke with Dr. Posey Pronto, nephrologist, regarding patient - Dr. Posey Pronto did not recommend any further testing to be performed, cleared patient for discharge and to follow up as outpatient with Dr. Lorrene Reid. Dr. Posey Pronto recommended to decrease diastole solutions from 2.5% to 1.5% - patient may be dialyzing too much fluid which can lead to dehydration and dizziness.  Patient was given IV fluids and IV anti-emetics (zofran) while in ED setting - patient responded well to fluids. Blood pressure controlled while in ED setting. Patient appears well. Patient stable, afebrile. Patient non-toxic appearing. Doubt peritonitis. Discussed with patient to decrease diastole solutions to 1.5%. Discussed with patient to rest and stay  hydrated. Referred patient to PCP and nephrologist. Discussed with patient and family to continue to monitor symptoms and if symptoms are to worsen or change to report back to the ED immediately - strict return instructions given.  Patient and family agreed to plan of care, understood, all questions answered.   Jamse Mead, PA-C 04/17/13 1830

## 2013-04-16 NOTE — ED Notes (Signed)
Pt. Has a hx of Atrial Fib, was recently here for that .  Today having dizziness denies any chest pain.  Wife reported that pt.s BP was low 70/40.  Pt. Is pale, warm . Alert and oriented X3

## 2013-04-20 ENCOUNTER — Telehealth: Payer: Self-pay | Admitting: *Deleted

## 2013-04-20 ENCOUNTER — Other Ambulatory Visit: Payer: Self-pay | Admitting: *Deleted

## 2013-04-20 MED ORDER — WARFARIN SODIUM 5 MG PO TABS: ORAL_TABLET | ORAL | Status: DC

## 2013-04-20 NOTE — Telephone Encounter (Signed)
Paul [name], patient needs coumadin refilled, he took his last one yesterday. He wants it to be sent to [facility] on [address]. He would appreciate a call when this has been taken care of. Thanks, MI

## 2013-04-23 ENCOUNTER — Ambulatory Visit (INDEPENDENT_AMBULATORY_CARE_PROVIDER_SITE_OTHER): Payer: Managed Care, Other (non HMO) | Admitting: Pharmacist

## 2013-04-23 ENCOUNTER — Ambulatory Visit (INDEPENDENT_AMBULATORY_CARE_PROVIDER_SITE_OTHER): Payer: Managed Care, Other (non HMO) | Admitting: Nurse Practitioner

## 2013-04-23 ENCOUNTER — Encounter: Payer: Self-pay | Admitting: Nurse Practitioner

## 2013-04-23 VITALS — BP 128/76 | HR 72 | Ht 74.0 in | Wt 208.4 lb

## 2013-04-23 DIAGNOSIS — I4891 Unspecified atrial fibrillation: Secondary | ICD-10-CM

## 2013-04-23 DIAGNOSIS — Z7901 Long term (current) use of anticoagulants: Secondary | ICD-10-CM

## 2013-04-23 NOTE — Progress Notes (Signed)
Paul [name] Date of Birth: January 13, 1956 Medical Record [id number]  History of Present Illness: [name] is seen back today for a post hospital visit. Seen for Dr. Martinique. He has DM, ESRD on peritoneal dialysis, duodenal cancer, GERD, depression,  and no reported CAD.  Most recently presented with palpitations and low BP - in the ER found to have AF with RVR - treated with Diltiazem x one dose and placed on anticoagulation. He remained in sinus. No additional medicines added unless he has recurrence - BP remained soft. Did have leukocytosis and was treated with antibiotics ? If this was the trigger.   Comes in today. Here alone. Says he is doing ok. No more palpitations. Remains fatigued but this is chronic and unchanged. No bleeding or bruising. On Epogen now weekly. Had labs from nephrololgy yesterday and typically has his labs at least a month. Needs INR today. Not using caffeine. No alcohol use. Doing peritoneal dialysis and remains on the transplant waiting list. Overall, he feels he is ok. He did go back to the ER last Friday because his BP was too low - felt to be dehydrated - no medicines were changed. He has adjusted his dialysis volume.   Current Outpatient Prescriptions  Medication Sig Dispense Refill  . aspirin EC 81 MG tablet Take 81 mg by mouth daily.      [name] atorvastatin (LIPITOR) 10 MG tablet Take 10 mg by mouth daily.       . calcitRIOL (ROCALTROL) 0.25 MCG capsule Take 0.25 mcg by mouth daily.       . calcium acetate (PHOSLO) 667 MG capsule Take 667 mg by mouth 3 (three) times daily with meals.       . cholecalciferol (VITAMIN D) 1000 UNITS tablet Take 1,000 Units by mouth daily.      . cyclobenzaprine (FLEXERIL) 10 MG tablet Take 10 mg by mouth at bedtime.      [name] epoetin alfa (EPOGEN,PROCRIT) 16109 UNIT/ML injection Inject 10,000 Units into the skin every Monday, Wednesday, and Friday.       . insulin glargine (LANTUS) 100 UNIT/ML injection Inject 20 Units into the skin every  morning.       . metoCLOPramide (REGLAN) 5 MG tablet Take 5 mg by mouth 4 (four) times daily -  with meals and at bedtime.       [name] omeprazole (PRILOSEC) 40 MG capsule Take 40 mg by mouth daily.      . sertraline (ZOLOFT) 50 MG tablet Take 50 mg by mouth daily.       [name] warfarin (COUMADIN) 5 MG tablet Take as directed by coumadin clinic  35 tablet  0   No current facility-administered medications for this visit.    Allergies  Allergen Reactions  . Metformin And Related Other (See Comments)    Kidney problems    Past Medical History  Diagnosis Date  . Diabetic neuropathy   . Cancer     History of Duodenal Cancer  . Chronic kidney disease     Pt is not eligible for transplant per [facility]  . Diabetes mellitus     Type 2 iddm x 11 yrs  . Anemia     esrd  . Hypertension     meds d/c 'd  2 wks ago for postural hypotension  . Fatigue      tires easily x 2 months  . Chronic nausea     with  morning vomiting; better off  HTN meds  .  Intermittent tremor   . Closed left arm fracture 1967  . Heart murmur   . Corneal abrasion, left   . Shortness of breath     exertional  . GERD (gastroesophageal reflux disease)   . History of blood transfusion   . Lack of coordination   . Muscle weakness (generalized)   . Glomerulosclerosis, diabetic   . Depression   . A-fib     Past Surgical History  Procedure Laterality Date  . Whipple procedure  2002    duodenal ca  . Bone marrow biopsy  2012  . Renal biopsy, percutaneous  2012  . Tonsillectomy    . Portacath placement  2002  . Biliary diversion, external  2002  . Other surgical history      Cyst removed from back  . Av fistula placement  06/07/2011    Procedure: ARTERIOVENOUS (AV) FISTULA CREATION;  Surgeon: Angelia Mould, MD;  Location: [place];  Service: Vascular;  Laterality: Left;  . Ligation of arteriovenous  fistula  05/22/2012    Procedure: LIGATION OF ARTERIOVENOUS  FISTULA;  Surgeon: Mal Misty, MD;  Location: [place];   Service: Vascular;  Laterality: Left;  Left brachio-cephalic fistula  . Insertion of dialysis catheter  05/22/2012    Procedure: INSERTION OF DIALYSIS CATHETER;  Surgeon: Mal Misty, MD;  Location: [place];  Service: Vascular;  Laterality: N/A;  right internal jugular vein  . Av fistula placement  06/18/2012    Procedure: ARTERIOVENOUS (AV) FISTULA CREATION;  Surgeon: Mal Misty, MD;  Location: [place];  Service: Vascular;  Laterality: Right;  [place]  . Colonoscopy      Hx: of  . Capd insertion N/A 01/14/2013    Procedure: LAPAROSCOPIC INSERTION CONTINUOUS AMBULATORY PERITONEAL DIALYSIS  (CAPD) CATHETER;  Surgeon: Adin Hector, MD;  Location: [place];  Service: General;  Laterality: N/A;    History  Smoking status  . Never Smoker   Smokeless tobacco  . Never Used    History  Alcohol Use No    Family History  Problem Relation Age of Onset  . Cancer Mother   . Cancer Father     prostate cancer  . Heart disease Maternal Grandfather   . Stroke Paternal Grandmother     Review of Systems: The review of systems is per the HPI.  All other systems were reviewed and are negative.  Physical Exam: BP 128/76  Pulse 72  Ht 6\' 2"  (1.88 m)  Wt 208 lb 6.4 oz (94.53 kg)  BMI 26.75 kg/m2 Patient is very pleasant and in no acute distress. Skin is warm and dry. Color is quite sallow.  HEENT is unremarkable. Normocephalic/atraumatic. PERRL. Sclera are nonicteric. Neck is supple. No masses. No JVD. Lungs are clear. Cardiac exam shows a regular rate and rhythm. Abdomen is soft. Extremities are without edema. Gait and ROM are intact. No gross neurologic deficits noted.  LABORATORY DATA: EKG shows sinus rhythm today.   Lab Results  Component Value Date   WBC 10.6* 04/16/2013   HGB 8.4* 04/16/2013   HCT 25.4* 04/16/2013   PLT 197 04/16/2013   GLUCOSE 176* 04/16/2013   CHOL 76 04/03/2013   TRIG 24 04/03/2013   HDL 33* 04/03/2013   LDLCALC 38 04/03/2013   ALT 10 04/16/2013   AST 11  04/16/2013   NA 136 04/16/2013   K 4.4 04/16/2013   CL 96 04/16/2013   CREATININE 9.23* 04/16/2013   BUN 79* 04/16/2013   CO2 21  04/16/2013   TSH 2.805 04/03/2013   INR 2.47* 04/16/2013    Lab Results  Component Value Date   INR 2.47* 04/16/2013   INR 3.6 04/13/2013   INR 3.0 04/09/2013    Echo Study Conclusions  - Left ventricle: The cavity size was mildly dilated. Wall thickness was normal. Systolic function was normal. The estimated ejection fraction was in the range of 55% to 60%. Wall motion was normal; there were no regional wall motion abnormalities. Features are consistent with a pseudonormal left ventricular filling pattern, with concomitant abnormal relaxation and increased filling pressure (grade 2 diastolic dysfunction). - Aortic valve: There was no stenosis. - Mitral valve: Trivial regurgitation. - Left atrium: The atrium was mildly dilated. - Right ventricle: The cavity size was normal. Systolic function was normal. - Right atrium: The atrium was mildly dilated. - Tricuspid valve: Peak RV-RA gradient: 65mm Hg (S). - Pulmonary arteries: PA peak pressure: 75mm Hg (S). - Inferior vena cava: The vessel was normal in size; the respirophasic diameter changes were in the normal range (= 50%); findings are consistent with normal central venous pressure.    Assessment / Plan: 1. PAF - one lone episode - on Warfarin - checking INR today. See him back in 3 months. Would consider CCB if he has recurrence per Dr. Doug Sou recommendation.  2. ESRD - on peritoneal dialysis - remains on transplant wait list as well.   3. Anemia - on Epogen - currently getting weekly injections - has monthly labs with nephrology.  Patient is agreeable to this plan and will call if any problems develop in the interim.   Burtis Junes, RN, [place] [address] [place], [place]  [phone]

## 2013-04-23 NOTE — Patient Instructions (Signed)
Stay on your current medicines  We will check your INR today  See Dr. Martinique in 3 months  Call the [place] office at [phone] if you have any questions, problems or concerns.

## 2013-05-07 ENCOUNTER — Ambulatory Visit (INDEPENDENT_AMBULATORY_CARE_PROVIDER_SITE_OTHER): Payer: Managed Care, Other (non HMO) | Admitting: *Deleted

## 2013-05-07 DIAGNOSIS — I4891 Unspecified atrial fibrillation: Secondary | ICD-10-CM

## 2013-05-07 DIAGNOSIS — Z7901 Long term (current) use of anticoagulants: Secondary | ICD-10-CM

## 2013-05-07 LAB — POCT INR: INR: 4.3

## 2013-05-15 ENCOUNTER — Emergency Department (HOSPITAL_COMMUNITY)
Admission: EM | Admit: 2013-05-15 | Discharge: 2013-05-16 | Disposition: A | Discharge status: Home / Self Care | Payer: Managed Care, Other (non HMO) | Attending: Emergency Medicine | Admitting: Emergency Medicine

## 2013-05-15 ENCOUNTER — Encounter (HOSPITAL_COMMUNITY): Payer: Self-pay | Admitting: Emergency Medicine

## 2013-05-15 DIAGNOSIS — E876 Hypokalemia: Secondary | ICD-10-CM | POA: Insufficient documentation

## 2013-05-15 DIAGNOSIS — K219 Gastro-esophageal reflux disease without esophagitis: Secondary | ICD-10-CM | POA: Insufficient documentation

## 2013-05-15 DIAGNOSIS — H53149 Visual discomfort, unspecified: Secondary | ICD-10-CM | POA: Insufficient documentation

## 2013-05-15 DIAGNOSIS — M6281 Muscle weakness (generalized): Secondary | ICD-10-CM | POA: Insufficient documentation

## 2013-05-15 DIAGNOSIS — Z9189 Other specified personal risk factors, not elsewhere classified: Secondary | ICD-10-CM | POA: Insufficient documentation

## 2013-05-15 DIAGNOSIS — R630 Anorexia: Secondary | ICD-10-CM | POA: Insufficient documentation

## 2013-05-15 DIAGNOSIS — N186 End stage renal disease: Secondary | ICD-10-CM | POA: Insufficient documentation

## 2013-05-15 DIAGNOSIS — Z794 Long term (current) use of insulin: Secondary | ICD-10-CM | POA: Insufficient documentation

## 2013-05-15 DIAGNOSIS — E1149 Type 2 diabetes mellitus with other diabetic neurological complication: Secondary | ICD-10-CM | POA: Insufficient documentation

## 2013-05-15 DIAGNOSIS — R21 Rash and other nonspecific skin eruption: Secondary | ICD-10-CM | POA: Insufficient documentation

## 2013-05-15 DIAGNOSIS — Z7982 Long term (current) use of aspirin: Secondary | ICD-10-CM | POA: Insufficient documentation

## 2013-05-15 DIAGNOSIS — I120 Hypertensive chronic kidney disease with stage 5 chronic kidney disease or end stage renal disease: Secondary | ICD-10-CM | POA: Insufficient documentation

## 2013-05-15 DIAGNOSIS — D649 Anemia, unspecified: Secondary | ICD-10-CM | POA: Insufficient documentation

## 2013-05-15 DIAGNOSIS — Z85038 Personal history of other malignant neoplasm of large intestine: Secondary | ICD-10-CM | POA: Insufficient documentation

## 2013-05-15 DIAGNOSIS — Z888 Allergy status to other drugs, medicaments and biological substances status: Secondary | ICD-10-CM | POA: Insufficient documentation

## 2013-05-15 DIAGNOSIS — R011 Cardiac murmur, unspecified: Secondary | ICD-10-CM | POA: Insufficient documentation

## 2013-05-15 DIAGNOSIS — G609 Hereditary and idiopathic neuropathy, unspecified: Secondary | ICD-10-CM | POA: Insufficient documentation

## 2013-05-15 DIAGNOSIS — Z79899 Other long term (current) drug therapy: Secondary | ICD-10-CM | POA: Insufficient documentation

## 2013-05-15 DIAGNOSIS — E1140 Type 2 diabetes mellitus with diabetic neuropathy, unspecified: Secondary | ICD-10-CM

## 2013-05-15 DIAGNOSIS — F329 Major depressive disorder, single episode, unspecified: Secondary | ICD-10-CM | POA: Insufficient documentation

## 2013-05-15 DIAGNOSIS — Z992 Dependence on renal dialysis: Secondary | ICD-10-CM | POA: Insufficient documentation

## 2013-05-15 DIAGNOSIS — F3289 Other specified depressive episodes: Secondary | ICD-10-CM | POA: Insufficient documentation

## 2013-05-15 DIAGNOSIS — R112 Nausea with vomiting, unspecified: Secondary | ICD-10-CM | POA: Insufficient documentation

## 2013-05-15 DIAGNOSIS — Z7901 Long term (current) use of anticoagulants: Secondary | ICD-10-CM | POA: Insufficient documentation

## 2013-05-15 DIAGNOSIS — I4891 Unspecified atrial fibrillation: Secondary | ICD-10-CM | POA: Insufficient documentation

## 2013-05-15 LAB — CBC WITH DIFFERENTIAL/PLATELET
Eosinophils Relative: 5 % (ref 0–5)
HCT: 27.9 % — ABNORMAL LOW (ref 39.0–52.0)
Hemoglobin: 9.4 g/dL — ABNORMAL LOW (ref 13.0–17.0)
Lymphocytes Relative: 11 % — ABNORMAL LOW (ref 12–46)
Lymphs Abs: 1.3 10*3/uL (ref 0.7–4.0)
MCV: 93 fL (ref 78.0–100.0)
Monocytes Absolute: 1.4 10*3/uL — ABNORMAL HIGH (ref 0.1–1.0)
Monocytes Relative: 12 % (ref 3–12)
RBC: 3 MIL/uL — ABNORMAL LOW (ref 4.22–5.81)
WBC: 11.7 10*3/uL — ABNORMAL HIGH (ref 4.0–10.5)

## 2013-05-15 LAB — COMPREHENSIVE METABOLIC PANEL
BUN: 77 mg/dL — ABNORMAL HIGH (ref 6–23)
CO2: 20 mEq/L (ref 19–32)
Calcium: 6.2 mg/dL — CL (ref 8.4–10.5)
Creatinine, Ser: 9.9 mg/dL — ABNORMAL HIGH (ref 0.50–1.35)
GFR calc Af Amer: 6 mL/min — ABNORMAL LOW (ref >90)
GFR calc non Af Amer: 5 mL/min — ABNORMAL LOW (ref >90)
Glucose, Bld: 267 mg/dL — ABNORMAL HIGH (ref 70–99)

## 2013-05-15 LAB — POCT I-STAT TROPONIN I: Troponin i, poc: 0.05 ng/mL (ref 0.00–0.08)

## 2013-05-15 LAB — PROTIME-INR: Prothrombin Time: 31.4 seconds — ABNORMAL HIGH (ref 11.6–15.2)

## 2013-05-15 LAB — CG4 I-STAT (LACTIC ACID): Lactic Acid, Venous: 2.13 mmol/L (ref 0.5–2.2)

## 2013-05-15 MED ORDER — POTASSIUM CHLORIDE 10 MEQ/100ML IV SOLN
10.0000 meq | Freq: Once | INTRAVENOUS | Status: AC
  Administered 2013-05-16: 10 meq via INTRAVENOUS
  Filled 2013-05-15: qty 100

## 2013-05-15 MED ORDER — MORPHINE SULFATE 4 MG/ML IJ SOLN
6.0000 mg | Freq: Once | INTRAMUSCULAR | Status: AC
  Administered 2013-05-16: 6 mg via INTRAVENOUS
  Filled 2013-05-15: qty 2

## 2013-05-15 MED ORDER — SODIUM CHLORIDE 0.9 % IV BOLUS (SEPSIS)
1000.0000 mL | Freq: Once | INTRAVENOUS | Status: AC
  Administered 2013-05-15: 1000 mL via INTRAVENOUS

## 2013-05-15 MED ORDER — SODIUM CHLORIDE 0.9 % IV SOLN
1.0000 g | Freq: Once | INTRAVENOUS | Status: AC
  Administered 2013-05-16: 1 g via INTRAVENOUS
  Filled 2013-05-15: qty 10

## 2013-05-15 MED ORDER — FENTANYL CITRATE 0.05 MG/ML IJ SOLN
100.0000 ug | Freq: Once | INTRAMUSCULAR | Status: AC
  Administered 2013-05-15: 100 ug via INTRAVENOUS
  Filled 2013-05-15: qty 2

## 2013-05-15 MED ORDER — ONDANSETRON HCL 4 MG/2ML IJ SOLN
4.0000 mg | Freq: Once | INTRAMUSCULAR | Status: DC
  Filled 2013-05-15: qty 2

## 2013-05-15 NOTE — ED Notes (Signed)
Pt c/o paion in both his legs for approx one week but worse today he has been retaining fluid for the past week.  This am he had redness to both lower legs in his ankles.  The pain is severe.  .  The pt is a peritoneal dialysis pt.    No previous histopry

## 2013-05-15 NOTE — ED Provider Notes (Signed)
CSN: [id number]     Arrival date & time 05/15/13  2124 History   First MD Initiated Contact with Patient 05/15/13 2244     Chief Complaint  Patient presents with  . Leg Pain   (Consider location/radiation/quality/duration/timing/severity/associated sxs/prior Treatment) HPI This 57 year old male has a history of diabetes with neuropathy with chronic kidney disease he is a peritoneal dialysis patient who presents with a 3 to four-week history of gradual onset constant generalized weakness decreased appetite and nausea with a few episodes of nonbloody vomiting per day but no diarrhea no abdominal pain no change in his dialysate color no fever no altered mental status no chest pain no cough no shortness of breath no focal or lateralizing weakness or numbness but did have severe edema to his lower legs which is now resolved over the last week however since his edema resolved he has severe constant burning pain with hypersensitivity to light touch to both feet and both ankles as well as some bruising discoloration of both ankles since the edema has gone in the bilateral morning for pain 24 hours a day for the last week as prompted his emergency department visit tonight. There is no treatment prior to arrival for his bilateral burning foot pain. Past Medical History  Diagnosis Date  . Diabetic neuropathy   . Cancer     History of Duodenal Cancer  . Chronic kidney disease     Pt is not eligible for transplant per [facility]  . Diabetes mellitus     Type 2 iddm x 11 yrs  . Anemia     esrd  . Hypertension     meds d/c 'd  2 wks ago for postural hypotension  . Fatigue      tires easily x 2 months  . Chronic nausea     with  morning vomiting; better off  HTN meds  . Intermittent tremor   . Closed left arm fracture 1967  . Heart murmur   . Corneal abrasion, left   . Shortness of breath     exertional  . GERD (gastroesophageal reflux disease)   . History of blood transfusion   . Lack of coordination    . Muscle weakness (generalized)   . Glomerulosclerosis, diabetic   . Depression   . A-fib    Past Surgical History  Procedure Laterality Date  . Whipple procedure  2002    duodenal ca  . Bone marrow biopsy  2012  . Renal biopsy, percutaneous  2012  . Tonsillectomy    . Portacath placement  2002  . Biliary diversion, external  2002  . Other surgical history      Cyst removed from back  . Av fistula placement  06/07/2011    Procedure: ARTERIOVENOUS (AV) FISTULA CREATION;  Surgeon: Angelia Mould, MD;  Location: [place];  Service: Vascular;  Laterality: Left;  . Ligation of arteriovenous  fistula  05/22/2012    Procedure: LIGATION OF ARTERIOVENOUS  FISTULA;  Surgeon: Mal Misty, MD;  Location: [place];  Service: Vascular;  Laterality: Left;  Left brachio-cephalic fistula  . Insertion of dialysis catheter  05/22/2012    Procedure: INSERTION OF DIALYSIS CATHETER;  Surgeon: Mal Misty, MD;  Location: [place];  Service: Vascular;  Laterality: N/A;  right internal jugular vein  . Av fistula placement  06/18/2012    Procedure: ARTERIOVENOUS (AV) FISTULA CREATION;  Surgeon: Mal Misty, MD;  Location: [place];  Service: Vascular;  Laterality: Right;  [place]  .  Colonoscopy      Hx: of  . Capd insertion N/A 01/14/2013    Procedure: LAPAROSCOPIC INSERTION CONTINUOUS AMBULATORY PERITONEAL DIALYSIS  (CAPD) CATHETER;  Surgeon: Adin Hector, MD;  Location: [place] [facility];  Service: General;  Laterality: N/A;   Family History  Problem Relation Age of Onset  . Cancer Mother   . Cancer Father     prostate cancer  . Heart disease Maternal Grandfather   . Stroke Paternal Grandmother    History  Substance Use Topics  . Smoking status: Never Smoker   . Smokeless tobacco: Never Used  . Alcohol Use: No    Review of Systems 10 Systems reviewed and are negative for acute change except as noted in the HPI. Allergies  Metformin and related  Home Medications   Current Outpatient Rx  Name  Route   Sig  Dispense  Refill  . aspirin EC 81 MG tablet   Oral   Take 81 mg by mouth daily.         [name] atorvastatin (LIPITOR) 10 MG tablet   Oral   Take 10 mg by mouth daily.          . calcitRIOL (ROCALTROL) 0.25 MCG capsule   Oral   Take 0.25 mcg by mouth daily.          . calcium acetate (PHOSLO) 667 MG capsule   Oral   Take 667 mg by mouth 3 (three) times daily with meals.          . cholecalciferol (VITAMIN D) 1000 UNITS tablet   Oral   Take 1,000 Units by mouth daily.         . cyclobenzaprine (FLEXERIL) 10 MG tablet   Oral   Take 10 mg by mouth at bedtime.         [name] epoetin alfa (EPOGEN,PROCRIT) 57846 UNIT/ML injection   Subcutaneous   Inject 10,000 Units into the skin every Monday, Wednesday, and Friday. Last dose was over a week ago         . insulin glargine (LANTUS) 100 UNIT/ML injection   Subcutaneous   Inject 20-27 Units into the skin every morning. Per sliding scale         . metoCLOPramide (REGLAN) 5 MG tablet   Oral   Take 5 mg by mouth 4 (four) times daily -  with meals and at bedtime.          [name] omeprazole (PRILOSEC) 40 MG capsule   Oral   Take 40 mg by mouth daily.         . sertraline (ZOLOFT) 50 MG tablet   Oral   Take 50 mg by mouth daily.          [name] warfarin (COUMADIN) 5 MG tablet   Oral   Take 2.5-5 mg by mouth See admin instructions. Take as directed by coumadin clinic.take 5mg  every day except on Wednesday and Sundays take 2.5mg  Take 2.5mg .         . gabapentin (NEURONTIN) 100 MG capsule   Oral   Take 1 capsule (100 mg total) by mouth 3 (three) times daily.   60 capsule   0   . metoCLOPramide (REGLAN) 10 MG tablet   Oral   Take 1 tablet (10 mg total) by mouth every 6 (six) hours as needed for nausea (nausea/headache).   6 tablet   0   . ondansetron (ZOFRAN ODT) 8 MG disintegrating tablet      8mg  ODT q4 hours  prn nausea   4 tablet   0   . oxyCODONE-acetaminophen (PERCOCET) 5-325 MG per tablet   Oral   Take  2 tablets by mouth every 4 (four) hours as needed.   20 tablet   0    BP 114/65  Pulse 68  Temp(Src) 98.1 F (36.7 C)  Resp 13  Ht 6\' 2"  (1.88 m)  Wt 189 lb 9.5 oz (85.999 kg)  BMI 24.33 kg/m2  SpO2 91% Physical Exam  Nursing note and vitals reviewed. Constitutional:  Awake, alert, nontoxic appearance.  HENT:  Head: Atraumatic.  Dry oral mucosa  Eyes: Right eye exhibits no discharge. Left eye exhibits no discharge.  Neck: Neck supple.  Cardiovascular: Normal rate and regular rhythm.   No murmur heard. Pulmonary/Chest: Effort normal and breath sounds normal. No respiratory distress. He has no wheezes. He has no rales. He exhibits no tenderness.  Abdominal: Soft. Bowel sounds are normal. He exhibits no distension and no mass. There is no tenderness. There is no rebound and no guarding.  Dialysis catheter site left lower quadrant without erythema or tenderness or fluctuance or purulent drainage  Musculoskeletal: He exhibits tenderness. He exhibits no edema.  Baseline ROM, no obvious new focal weakness. Good thrill to fistula right wrist. Both legs are nontender with normal light touch to the thighs and calves of her bilateral ankles and feet are hypersensitive to light touch causing severe burning pain with capillary refill less than 2 seconds in both toes both feet and nonpalpable pulses but good movement and no ulcers or evidence of cellulitis however he does have some petechial rash of both ankles without excessive warmth or induration or erythema to suggest cellulitis and rashes suspicious for residual stasis dermatitis from recent edema  Neurological: He is alert.  Mental status and motor strength appears baseline for patient and situation.  Skin: No rash noted.  Psychiatric: He has a normal mood and affect.    ED Course  Procedures (including critical care time) History of anemia hypocalcemia and hypokalemia; Patient / Family / Caregiver informed of clinical course,  understand medical decision-making process, and agree with plan. Transient hypotension resolved in ED. Labs Review Labs Reviewed  CBC WITH DIFFERENTIAL - Abnormal; Notable for the following:    WBC 11.7 (*)    RBC 3.00 (*)    Hemoglobin 9.4 (*)    HCT 27.9 (*)    Neutro Abs 8.4 (*)    Lymphocytes Relative 11 (*)    Monocytes Absolute 1.4 (*)    All other components within normal limits  COMPREHENSIVE METABOLIC PANEL - Abnormal; Notable for the following:    Potassium 2.9 (*)    Chloride 92 (*)    Glucose, Bld 267 (*)    BUN 77 (*)    Creatinine, Ser 9.90 (*)    Calcium 6.2 (*)    Total Protein 5.8 (*)    Albumin 2.1 (*)    GFR calc non Af Amer 5 (*)    GFR calc Af Amer 6 (*)    All other components within normal limits  PROTIME-INR - Abnormal; Notable for the following:    Prothrombin Time 31.4 (*)    INR 3.17 (*)    All other components within normal limits  CG4 I-STAT (LACTIC ACID)  POCT I-STAT TROPONIN I   Imaging Review No results found.  EKG Interpretation    Date/Time:  Saturday May 15 2013 22:19:44 EST Ventricular Rate:  76 PR Interval:  136 QRS Duration: 97  QT Interval:  462 QTC Calculation: 519 R Axis:   20 Text Interpretation:  Sinus rhythm Atrial premature complex Borderline repolarization abnormality Minimal ST elevation, lateral leads Prolonged QT interval Baseline wander in lead(s) II III aVL aVF V4 V5 No significant change since last tracing Confirmed by [facility]  MD, Sovereign Ramiro ([id number]) on 05/15/2013 11:12:27 PM            MDM   1. Diabetic neuropathy, painful   2. Hypokalemia   3. Hypocalcemia   4. Long term (current) use of anticoagulants   5. End stage renal disease    I doubt any other EMC precluding discharge at this time including, but not necessarily limited to the following:SBI, acutely ischemia feet.    Babette Relic, MD 05/17/13 [phone]

## 2013-05-15 NOTE — ED Notes (Signed)
MD at bedside. 

## 2013-05-16 MED ORDER — METOCLOPRAMIDE HCL 10 MG PO TABS: 10.0000 mg | ORAL_TABLET | Freq: Four times a day (QID) | ORAL | Status: DC | PRN

## 2013-05-16 MED ORDER — OXYCODONE-ACETAMINOPHEN 5-325 MG PO TABS: 2.0000 | ORAL_TABLET | ORAL | Status: DC | PRN

## 2013-05-16 MED ORDER — ONDANSETRON 8 MG PO TBDP: ORAL_TABLET | ORAL | Status: DC

## 2013-05-16 MED ORDER — MORPHINE SULFATE 4 MG/ML IJ SOLN
6.0000 mg | Freq: Once | INTRAMUSCULAR | Status: DC
  Filled 2013-05-16: qty 2

## 2013-05-16 MED ORDER — GABAPENTIN 100 MG PO CAPS: 100.0000 mg | ORAL_CAPSULE | Freq: Three times a day (TID) | ORAL | Status: DC

## 2013-05-20 HISTORY — DX: Other disorders of calcium metabolism: E83.59

## 2013-05-21 ENCOUNTER — Ambulatory Visit (INDEPENDENT_AMBULATORY_CARE_PROVIDER_SITE_OTHER): Payer: Managed Care, Other (non HMO) | Admitting: Pharmacist

## 2013-05-21 DIAGNOSIS — Z7901 Long term (current) use of anticoagulants: Secondary | ICD-10-CM

## 2013-05-21 DIAGNOSIS — I4891 Unspecified atrial fibrillation: Secondary | ICD-10-CM

## 2013-05-21 LAB — POCT INR: INR: 4.8

## 2013-05-24 ENCOUNTER — Inpatient Hospital Stay (HOSPITAL_COMMUNITY): Payer: Managed Care, Other (non HMO)

## 2013-05-24 ENCOUNTER — Encounter (HOSPITAL_COMMUNITY): Payer: Self-pay | Admitting: Emergency Medicine

## 2013-05-24 ENCOUNTER — Inpatient Hospital Stay (HOSPITAL_COMMUNITY)
Admission: EM | Admit: 2013-05-24 | Discharge: 2013-05-31 | DRG: 682 | Disposition: A | Discharge status: Rehabilitation Facility | Payer: Managed Care, Other (non HMO) | Attending: Internal Medicine | Admitting: Internal Medicine

## 2013-05-24 ENCOUNTER — Emergency Department (HOSPITAL_COMMUNITY): Payer: Managed Care, Other (non HMO)

## 2013-05-24 DIAGNOSIS — K219 Gastro-esophageal reflux disease without esophagitis: Secondary | ICD-10-CM

## 2013-05-24 DIAGNOSIS — E46 Unspecified protein-calorie malnutrition: Secondary | ICD-10-CM | POA: Diagnosis present

## 2013-05-24 DIAGNOSIS — R5381 Other malaise: Secondary | ICD-10-CM | POA: Diagnosis present

## 2013-05-24 DIAGNOSIS — R627 Adult failure to thrive: Secondary | ICD-10-CM | POA: Diagnosis present

## 2013-05-24 DIAGNOSIS — E119 Type 2 diabetes mellitus without complications: Secondary | ICD-10-CM | POA: Diagnosis present

## 2013-05-24 DIAGNOSIS — E1149 Type 2 diabetes mellitus with other diabetic neurological complication: Secondary | ICD-10-CM | POA: Diagnosis present

## 2013-05-24 DIAGNOSIS — F32A Depression, unspecified: Secondary | ICD-10-CM

## 2013-05-24 DIAGNOSIS — E876 Hypokalemia: Secondary | ICD-10-CM | POA: Diagnosis present

## 2013-05-24 DIAGNOSIS — D72829 Elevated white blood cell count, unspecified: Secondary | ICD-10-CM | POA: Diagnosis present

## 2013-05-24 DIAGNOSIS — N186 End stage renal disease: Secondary | ICD-10-CM | POA: Diagnosis present

## 2013-05-24 DIAGNOSIS — IMO0002 Reserved for concepts with insufficient information to code with codable children: Secondary | ICD-10-CM

## 2013-05-24 DIAGNOSIS — Z7901 Long term (current) use of anticoagulants: Secondary | ICD-10-CM

## 2013-05-24 DIAGNOSIS — F3289 Other specified depressive episodes: Secondary | ICD-10-CM | POA: Diagnosis present

## 2013-05-24 DIAGNOSIS — N2581 Secondary hyperparathyroidism of renal origin: Secondary | ICD-10-CM | POA: Diagnosis present

## 2013-05-24 DIAGNOSIS — I959 Hypotension, unspecified: Secondary | ICD-10-CM | POA: Diagnosis present

## 2013-05-24 DIAGNOSIS — Z794 Long term (current) use of insulin: Secondary | ICD-10-CM

## 2013-05-24 DIAGNOSIS — M899 Disorder of bone, unspecified: Secondary | ICD-10-CM | POA: Diagnosis present

## 2013-05-24 DIAGNOSIS — Z79899 Other long term (current) drug therapy: Secondary | ICD-10-CM

## 2013-05-24 DIAGNOSIS — D638 Anemia in other chronic diseases classified elsewhere: Secondary | ICD-10-CM | POA: Diagnosis present

## 2013-05-24 DIAGNOSIS — E1142 Type 2 diabetes mellitus with diabetic polyneuropathy: Secondary | ICD-10-CM | POA: Diagnosis present

## 2013-05-24 DIAGNOSIS — M949 Disorder of cartilage, unspecified: Secondary | ICD-10-CM

## 2013-05-24 DIAGNOSIS — I120 Hypertensive chronic kidney disease with stage 5 chronic kidney disease or end stage renal disease: Principal | ICD-10-CM | POA: Diagnosis present

## 2013-05-24 DIAGNOSIS — F329 Major depressive disorder, single episode, unspecified: Secondary | ICD-10-CM | POA: Diagnosis present

## 2013-05-24 DIAGNOSIS — I4891 Unspecified atrial fibrillation: Secondary | ICD-10-CM | POA: Diagnosis present

## 2013-05-24 DIAGNOSIS — Z992 Dependence on renal dialysis: Secondary | ICD-10-CM

## 2013-05-24 DIAGNOSIS — R531 Weakness: Secondary | ICD-10-CM | POA: Diagnosis present

## 2013-05-24 DIAGNOSIS — Z9181 History of falling: Secondary | ICD-10-CM

## 2013-05-24 DIAGNOSIS — D649 Anemia, unspecified: Secondary | ICD-10-CM

## 2013-05-24 DIAGNOSIS — Z7982 Long term (current) use of aspirin: Secondary | ICD-10-CM

## 2013-05-24 LAB — GLUCOSE, SEROUS FLUID: GLUCOSE FL: 629 mg/dL

## 2013-05-24 LAB — COMPREHENSIVE METABOLIC PANEL
BUN: 102 mg/dL — ABNORMAL HIGH (ref 6–23)
CO2: 18 mEq/L — ABNORMAL LOW (ref 19–32)
Calcium: 6.6 mg/dL — ABNORMAL LOW (ref 8.4–10.5)
Chloride: 90 mEq/L — ABNORMAL LOW (ref 96–112)
Creatinine, Ser: 10.02 mg/dL — ABNORMAL HIGH (ref 0.50–1.35)
GFR calc Af Amer: 6 mL/min — ABNORMAL LOW (ref >90)
GFR calc non Af Amer: 5 mL/min — ABNORMAL LOW (ref >90)
Glucose, Bld: 314 mg/dL — ABNORMAL HIGH (ref 70–99)
Total Bilirubin: 0.4 mg/dL (ref 0.3–1.2)

## 2013-05-24 LAB — BODY FLUID CELL COUNT WITH DIFFERENTIAL
Lymphs, Fluid: 2 %
Monocyte-Macrophage-Serous Fluid: 94 % — ABNORMAL HIGH (ref 50–90)
Neutrophil Count, Fluid: 4 % (ref 0–25)
Total Nucleated Cell Count, Fluid: 2 cu mm (ref 0–1000)

## 2013-05-24 LAB — CBC WITH DIFFERENTIAL/PLATELET
Basophils Absolute: 0 K/uL (ref 0.0–0.1)
Basophils Relative: 0 % (ref 0–1)
Eosinophils Absolute: 0.2 10*3/uL (ref 0.0–0.7)
Eosinophils Relative: 1 % (ref 0–5)
HCT: 27.5 % — ABNORMAL LOW (ref 39.0–52.0)
Hemoglobin: 9.4 g/dL — ABNORMAL LOW (ref 13.0–17.0)
Lymphocytes Relative: 3 % — ABNORMAL LOW (ref 12–46)
Lymphs Abs: 0.5 10*3/uL — ABNORMAL LOW (ref 0.7–4.0)
MCH: 31.1 pg (ref 26.0–34.0)
MCHC: 34.2 g/dL (ref 30.0–36.0)
MCV: 91.1 fL (ref 78.0–100.0)
Monocytes Absolute: 1.2 10*3/uL — ABNORMAL HIGH (ref 0.1–1.0)
Monocytes Relative: 7 % (ref 3–12)
Neutro Abs: 15.8 10*3/uL — ABNORMAL HIGH (ref 1.7–7.7)
Neutrophils Relative %: 89 % — ABNORMAL HIGH (ref 43–77)
Platelets: 269 10*3/uL (ref 150–400)
RBC: 3.02 MIL/uL — ABNORMAL LOW (ref 4.22–5.81)
RDW: 14.5 % (ref 11.5–15.5)
WBC: 17.7 10*3/uL — ABNORMAL HIGH (ref 4.0–10.5)

## 2013-05-24 LAB — GRAM STAIN

## 2013-05-24 LAB — GLUCOSE, CAPILLARY: Glucose-Capillary: 155 mg/dL — ABNORMAL HIGH (ref 70–99)

## 2013-05-24 LAB — PROTIME-INR
INR: 2.14 — ABNORMAL HIGH (ref 0.00–1.49)
Prothrombin Time: 23.2 seconds — ABNORMAL HIGH (ref 11.6–15.2)

## 2013-05-24 LAB — COMPREHENSIVE METABOLIC PANEL WITH GFR
ALT: 21 U/L (ref 0–53)
AST: 26 U/L (ref 0–37)
Albumin: 1.7 g/dL — ABNORMAL LOW (ref 3.5–5.2)
Alkaline Phosphatase: 136 U/L — ABNORMAL HIGH (ref 39–117)
Potassium: 3.4 meq/L — ABNORMAL LOW (ref 3.7–5.3)
Sodium: 135 meq/L — ABNORMAL LOW (ref 137–147)
Total Protein: 5.6 g/dL — ABNORMAL LOW (ref 6.0–8.3)

## 2013-05-24 LAB — APTT: aPTT: 43 seconds — ABNORMAL HIGH (ref 24–37)

## 2013-05-24 MED ORDER — GABAPENTIN 100 MG PO CAPS
100.0000 mg | ORAL_CAPSULE | Freq: Three times a day (TID) | ORAL | Status: DC
  Administered 2013-05-25 – 2013-05-31 (×18): 100 mg via ORAL
  Filled 2013-05-24 (×23): qty 1

## 2013-05-24 MED ORDER — DEXTROSE 5 % IV SOLN
1.0000 g | INTRAVENOUS | Status: DC
  Administered 2013-05-25: 01:00:00 via INTRAVENOUS
  Administered 2013-05-25: 1 g via INTRAVENOUS
  Filled 2013-05-24 (×3): qty 10

## 2013-05-24 MED ORDER — ASPIRIN EC 81 MG PO TBEC
81.0000 mg | DELAYED_RELEASE_TABLET | Freq: Every day | ORAL | Status: DC
  Administered 2013-05-25 – 2013-05-31 (×8): 81 mg via ORAL
  Filled 2013-05-24 (×8): qty 1

## 2013-05-24 MED ORDER — HYDROCODONE-ACETAMINOPHEN 5-325 MG PO TABS
1.0000 | ORAL_TABLET | ORAL | Status: DC | PRN
  Administered 2013-05-25: 1 via ORAL
  Administered 2013-05-27: 2 via ORAL
  Filled 2013-05-24: qty 1
  Filled 2013-05-24 (×2): qty 2

## 2013-05-24 MED ORDER — ONDANSETRON 8 MG PO TBDP
8.0000 mg | ORAL_TABLET | Freq: Three times a day (TID) | ORAL | Status: DC | PRN
  Administered 2013-05-25: 8 mg via ORAL
  Filled 2013-05-24: qty 2
  Filled 2013-05-24: qty 1

## 2013-05-24 MED ORDER — SODIUM CHLORIDE 0.9 % IJ SOLN
3.0000 mL | Freq: Two times a day (BID) | INTRAMUSCULAR | Status: DC
  Administered 2013-05-25 – 2013-05-30 (×9): 3 mL via INTRAVENOUS

## 2013-05-24 MED ORDER — PROMETHAZINE HCL 25 MG PO TABS
12.5000 mg | ORAL_TABLET | Freq: Four times a day (QID) | ORAL | Status: DC | PRN
  Administered 2013-05-30: 12.5 mg via ORAL
  Filled 2013-05-24 (×2): qty 1

## 2013-05-24 MED ORDER — INSULIN ASPART 100 UNIT/ML ~~LOC~~ SOLN
4.0000 [IU] | Freq: Three times a day (TID) | SUBCUTANEOUS | Status: DC
Start: 2013-05-25 — End: 2013-05-29
  Administered 2013-05-26 – 2013-05-29 (×5): 4 [IU] via SUBCUTANEOUS

## 2013-05-24 MED ORDER — ATORVASTATIN CALCIUM 10 MG PO TABS
10.0000 mg | ORAL_TABLET | Freq: Every day | ORAL | Status: DC
  Administered 2013-05-25 – 2013-05-31 (×8): 10 mg via ORAL
  Filled 2013-05-24 (×8): qty 1

## 2013-05-24 MED ORDER — SERTRALINE HCL 50 MG PO TABS
50.0000 mg | ORAL_TABLET | Freq: Every day | ORAL | Status: DC
  Administered 2013-05-25 (×2): 50 mg via ORAL
  Filled 2013-05-24 (×3): qty 1

## 2013-05-24 MED ORDER — WARFARIN - PHARMACIST DOSING INPATIENT
Freq: Every day | Status: DC
  Administered 2013-05-26: 18:00:00

## 2013-05-24 MED ORDER — SODIUM CHLORIDE 0.9 % IV SOLN: 250.0000 mL | INTRAVENOUS | Status: DC | PRN

## 2013-05-24 MED ORDER — WARFARIN SODIUM 5 MG PO TABS
5.0000 mg | ORAL_TABLET | ORAL | Status: AC
  Administered 2013-05-25: 5 mg via ORAL
  Filled 2013-05-24 (×2): qty 1

## 2013-05-24 MED ORDER — CALCITRIOL 0.25 MCG PO CAPS
0.2500 ug | ORAL_CAPSULE | Freq: Every day | ORAL | Status: DC
  Administered 2013-05-25 – 2013-05-26 (×3): 0.25 ug via ORAL
  Filled 2013-05-24 (×5): qty 1

## 2013-05-24 MED ORDER — INSULIN ASPART 100 UNIT/ML ~~LOC~~ SOLN
0.0000 [IU] | Freq: Three times a day (TID) | SUBCUTANEOUS | Status: DC
  Administered 2013-05-25: 3 [IU] via SUBCUTANEOUS
  Administered 2013-05-26 (×2): 2 [IU] via SUBCUTANEOUS
  Administered 2013-05-28 – 2013-05-30 (×3): 3 [IU] via SUBCUTANEOUS
  Administered 2013-05-30: 2 [IU] via SUBCUTANEOUS
  Administered 2013-05-31 (×2): 3 [IU] via SUBCUTANEOUS
  Filled 2013-05-24: qty 1

## 2013-05-24 MED ORDER — INSULIN ASPART 100 UNIT/ML ~~LOC~~ SOLN: 0.0000 [IU] | Freq: Every day | SUBCUTANEOUS | Status: DC

## 2013-05-24 MED ORDER — DEXTROSE 5 % IV SOLN
500.0000 mg | INTRAVENOUS | Status: DC
  Administered 2013-05-24: via INTRAVENOUS
  Administered 2013-05-25: 500 mg via INTRAVENOUS
  Filled 2013-05-24 (×2): qty 500

## 2013-05-24 MED ORDER — HYDROMORPHONE HCL PF 1 MG/ML IJ SOLN: 1.0000 mg | INTRAMUSCULAR | Status: DC | PRN

## 2013-05-24 MED ORDER — METOCLOPRAMIDE HCL 10 MG PO TABS
10.0000 mg | ORAL_TABLET | Freq: Four times a day (QID) | ORAL | Status: DC | PRN
  Filled 2013-05-24: qty 1

## 2013-05-24 MED ORDER — MORPHINE SULFATE 4 MG/ML IJ SOLN
2.0000 mg | Freq: Once | INTRAMUSCULAR | Status: AC
  Administered 2013-05-24: 2 mg via INTRAVENOUS
  Filled 2013-05-24: qty 1

## 2013-05-24 MED ORDER — SODIUM CHLORIDE 0.9 % IJ SOLN: 3.0000 mL | INTRAMUSCULAR | Status: DC | PRN

## 2013-05-24 MED ORDER — CYCLOBENZAPRINE HCL 10 MG PO TABS
10.0000 mg | ORAL_TABLET | Freq: Every day | ORAL | Status: DC
  Administered 2013-05-24 – 2013-05-30 (×7): 10 mg via ORAL
  Filled 2013-05-24 (×11): qty 1

## 2013-05-24 MED ORDER — CALCIUM ACETATE 667 MG PO CAPS
667.0000 mg | ORAL_CAPSULE | Freq: Three times a day (TID) | ORAL | Status: DC
  Administered 2013-05-25 (×2): 667 mg via ORAL
  Filled 2013-05-24 (×4): qty 1

## 2013-05-24 MED ORDER — MORPHINE SULFATE 4 MG/ML IJ SOLN
4.0000 mg | Freq: Once | INTRAMUSCULAR | Status: AC
  Administered 2013-05-24: 4 mg via INTRAVENOUS
  Filled 2013-05-24: qty 1

## 2013-05-24 MED ORDER — PANTOPRAZOLE SODIUM 40 MG PO TBEC
40.0000 mg | DELAYED_RELEASE_TABLET | Freq: Every day | ORAL | Status: DC
  Administered 2013-05-25 – 2013-05-31 (×8): 40 mg via ORAL
  Filled 2013-05-24 (×8): qty 1

## 2013-05-24 MED ORDER — VITAMIN D3 25 MCG (1000 UNIT) PO TABS
1000.0000 [IU] | ORAL_TABLET | Freq: Every day | ORAL | Status: DC
  Administered 2013-05-25 – 2013-05-31 (×8): 1000 [IU] via ORAL
  Filled 2013-05-24 (×8): qty 1

## 2013-05-24 NOTE — ED Notes (Signed)
Pt.  Has c/o falling while using a walker.  Pt. Has c/o hitting head on some boxes. Pt. has c/o weakness and not feeling well since last night. Pt. Is on Coumadin.

## 2013-05-24 NOTE — Progress Notes (Signed)
ANTICOAGULATION CONSULT NOTE - Initial Consult  Pharmacy Consult:  Coumadin Indication: atrial fibrillation  Allergies  Allergen Reactions  . Metformin And Related Other (See Comments)    Kidney problems    Patient Measurements: Height: 6' 2.02" (188 cm) Weight: 189 lb 9.5 oz (86 kg) IBW/kg (Calculated) : 82.24  Vital Signs: Temp: 97.8 F (36.6 C) (01/05 2142) Temp src: Oral (01/05 2142) BP: 102/75 mmHg (01/05 2145) Pulse Rate: 76 (01/05 2145)  Labs:  Recent Labs  05/24/13 1157  HGB 9.4*  HCT 27.5*  PLT 269  APTT 43*  LABPROT 23.2*  INR 2.14*  CREATININE 10.02*    Estimated Creatinine Clearance: 9.2 ml/min (by C-G formula based on Cr of 10.02).   Medical History: Past Medical History  Diagnosis Date  . Diabetic neuropathy   . Cancer     History of Duodenal Cancer  . Chronic kidney disease     Pt is not eligible for transplant per [facility]  . Diabetes mellitus     Type 2 iddm x 11 yrs  . Anemia     esrd  . Hypertension     meds d/c 'd  2 wks ago for postural hypotension  . Fatigue      tires easily x 2 months  . Chronic nausea     with  morning vomiting; better off  HTN meds  . Intermittent tremor   . Closed left arm fracture 1967  . Heart murmur   . Corneal abrasion, left   . Shortness of breath     exertional  . GERD (gastroesophageal reflux disease)   . History of blood transfusion   . Lack of coordination   . Muscle weakness (generalized)   . Glomerulosclerosis, diabetic   . Depression   . A-fib        Assessment: 61 YOM with history of Afib to continue on Coumadin from PTA.  Patient's INR is therapeutic on his home regimen.  No bleeding reported.   Goal of Therapy:  INR 2-3 Monitor platelets by anticoagulation protocol: Yes    Plan:  - Coumadin 5mg  PO tonight - F/U INR in AM and resume home regimen if INR remains stable    Iokepa Geffre D. Mina Marble, PharmD, BCPS Pager:  [phone] 05/24/2013, 10:00 PM

## 2013-05-24 NOTE — ED Provider Notes (Signed)
Medical screening examination/treatment/procedure(s) were conducted as a shared visit with non-physician practitioner(s) and myself.  I personally evaluated the patient during the encounter.  EKG Interpretation    Date/Time:  Monday May 24 2013 12:53:22 EST Ventricular Rate:  90 PR Interval:  160 QRS Duration: 80 QT Interval:  407 QTC Calculation: 498 R Axis:   15 Text Interpretation:  Age not entered, assumed to be  57 years old for purpose of ECG interpretation Sinus rhythm Low voltage, precordial leads Nonspecific T abnormalities, lateral leads Borderline prolonged QT interval t wave changes are more pronounced compared to prior.   Confirmed by [facility]  MD, TREY [phone]) on 05/24/2013 2:45:21 PM            58 yo male with hx of ESRD on peritoneal dialysis presenting with gradual onset progressive weakness culminating in a fall earlier today.  No LOC, but struck back of head.  On coumadin with therapeutic INR.  On exam, nontoxic, but chronically ill appearing.  Lungs clear, heart sounds normal with RRR.  Abdomen is mildly tender to palpation, but soft without guarding.  He was unable to ambulate due to his progressive weakness.  Will require admission for monitoring of closed head injury on coumadin and for evaluation of his decline in functional status.    Clinical Impression: Fall Closed head injury Anticoagulation Weakness   [name], MD 05/25/13 [phone]

## 2013-05-24 NOTE — H&P (Addendum)
[facility] History and Physical  MORIAH LOUGHRY DEY:[id number] DOB: [birth year] DOA: 05/24/2013  Referring physician: ED physician PCP: [name], MD   Chief Complaint: generalized weakness   HPI:  58 year old male has a history of diabetes mellitus with complications of neuropathy, hypokalemia, end stage chronic kidney disease on peritoneal dialysis at home daily, who presented to [facility] ED with main concern of several weeks duration of progressively worsening generalized weakness, one episode of fall earlier today prior to this admission, while he was trying to get out of the bed. He explains he fell down and he hit his head but denies LOC. Pt explains he has been having generalized headaches since the fall, throbbing and constant, 3/10 in severity and non radiating no specific alleviating or aggravating factors, no visual changes. Pt currently denies chest pain or shortness of breath, no specific abdominal or urinary concerns, no similar events in the past. He also denies fevers, chills.   In ED, pt hemodynamically stable, electrolyte panel with several metabolic derangement including low K 3.4, sodium 135, Ca 10.2. TRH asked to admit for further evaluation and medical bed requested. CT head is negative for acute intracranial abnormalities.   Assessment and Plan:  Generalized weakness with subsequent fall - unclear etiology, review of records indicate similar events in the past due to low BP  - BP on this admission initially 90/60 with slight improvement one NS bolus to 100/60 - will admit to telemetry bed  - will need PT evaluation once more medically stable - monitor vitals and blood work  ESRD - on peritoneal dialysis  - consider nephrology consult in AM Diabetes mellitus  - will check A1C - place on SSI for now with meal and night coverage - hold long acting insulin until PO intake improves  Hypokalemia - mild, repeat BMP in AM Leukocytosis - unclear etiology but with soft  BP and low grade fever certainly possible infectious etiology - will ask for CXR, unable to get UA as pt not making any urine  - place on Zithromax and Rocephin for now  Anemia of chronic disease  - baseline Hg appears to be 8-9 - no signs of active bleed - repeat CBC in AM A-fib - continue Coumadin per pharmacy   Code Status: Full Family Communication: Pt at bedside Disposition Plan: Admit to medical floor    Review of Systems:  Constitutional: Negative for diaphoresis.  HENT: Negative for hearing loss, ear pain, nosebleeds, congestion, sore throat, neck pain, tinnitus and ear discharge.   Eyes: Negative for blurred vision, double vision, photophobia, pain, discharge and redness.  Respiratory: Negative for cough, hemoptysis, sputum production, shortness of breath, wheezing and stridor.   Cardiovascular: Negative for chest pain, palpitations, orthopnea, claudication and leg swelling.  Gastrointestinal: Negative for heartburn, constipation, blood in stool and melena.  Genitourinary: Negative for dysuria, urgency, frequency, hematuria and flank pain.  Musculoskeletal: Negative for myalgias, back pain, joint pain.  Skin: Negative for itching and rash.  Neurological: Negative for tingling, tremors, sensory change, speech change, focal weakness.  Endo/Heme/Allergies: Negative for environmental allergies and polydipsia. Does not bruise/bleed easily.  Psychiatric/Behavioral: Negative for suicidal ideas. The patient is not nervous/anxious.      Past Medical History  Diagnosis Date  . Diabetic neuropathy   . Cancer     History of Duodenal Cancer  . Chronic kidney disease     Pt is not eligible for transplant per [facility]  . Diabetes mellitus     Type 2 iddm x  11 yrs  . Anemia     esrd  . Hypertension     meds d/c 'd  2 wks ago for postural hypotension  . Fatigue      tires easily x 2 months  . Chronic nausea     with  morning vomiting; better off  HTN meds  . Intermittent tremor    . Closed left arm fracture 1967  . Heart murmur   . Corneal abrasion, left   . Shortness of breath     exertional  . GERD (gastroesophageal reflux disease)   . History of blood transfusion   . Lack of coordination   . Muscle weakness (generalized)   . Glomerulosclerosis, diabetic   . Depression   . A-fib     Past Surgical History  Procedure Laterality Date  . Whipple procedure  2002    duodenal ca  . Bone marrow biopsy  2012  . Renal biopsy, percutaneous  2012  . Tonsillectomy    . Portacath placement  2002  . Biliary diversion, external  2002  . Other surgical history      Cyst removed from back  . Av fistula placement  06/07/2011    Procedure: ARTERIOVENOUS (AV) FISTULA CREATION;  Surgeon: Chuck Hint, MD;  Location: [facility] OR;  Service: Vascular;  Laterality: Left;  . Ligation of arteriovenous  fistula  05/22/2012    Procedure: LIGATION OF ARTERIOVENOUS  FISTULA;  Surgeon: Pryor Ochoa, MD;  Location: [facility] OR;  Service: Vascular;  Laterality: Left;  Left brachio-cephalic fistula  . Insertion of dialysis catheter  05/22/2012    Procedure: INSERTION OF DIALYSIS CATHETER;  Surgeon: Pryor Ochoa, MD;  Location: [facility] OR;  Service: Vascular;  Laterality: N/A;  right internal jugular vein  . Av fistula placement  06/18/2012    Procedure: ARTERIOVENOUS (AV) FISTULA CREATION;  Surgeon: Pryor Ochoa, MD;  Location: [facility] OR;  Service: Vascular;  Laterality: Right;  [place]  . Colonoscopy      Hx: of  . Capd insertion N/A 01/14/2013    Procedure: LAPAROSCOPIC INSERTION CONTINUOUS AMBULATORY PERITONEAL DIALYSIS  (CAPD) CATHETER;  Surgeon: Ardeth Sportsman, MD;  Location: [facility] OR;  Service: General;  Laterality: N/A;    Social History:  reports that he has never smoked. He has never used smokeless tobacco. He reports that he does not drink alcohol or use illicit drugs.  Allergies  Allergen Reactions  . Metformin And Related Other (See Comments)    Kidney problems    Family History   Problem Relation Age of Onset  . Cancer Mother   . Cancer Father     prostate cancer  . Heart disease Maternal Grandfather   . Stroke Paternal Grandmother     Prior to Admission medications   Medication Sig Start Date End Date Taking? Authorizing Provider  aspirin EC 81 MG tablet Take 81 mg by mouth daily.   Yes Historical Provider, MD  atorvastatin (LIPITOR) 10 MG tablet Take 10 mg by mouth daily.    Yes Historical Provider, MD  calcitRIOL (ROCALTROL) 0.25 MCG capsule Take 0.25 mcg by mouth daily.    Yes Historical Provider, MD  calcium acetate (PHOSLO) 667 MG capsule Take 667 mg by mouth 3 (three) times daily with meals.  06/07/11  Yes Historical Provider, MD  cholecalciferol (VITAMIN D) 1000 UNITS tablet Take 1,000 Units by mouth daily.   Yes Historical Provider, MD  cyclobenzaprine (FLEXERIL) 10 MG tablet Take 10 mg by mouth at bedtime.  Yes Historical Provider, MD  epoetin alfa (EPOGEN,PROCRIT) 73532 UNIT/ML injection Inject 21,000 Units into the skin See admin instructions. 21,000 units every Monday and Friday   Yes Historical Provider, MD  gabapentin (NEURONTIN) 100 MG capsule Take 1 capsule (100 mg total) by mouth 3 (three) times daily. 05/16/13  Yes Babette Relic, MD  insulin glargine (LANTUS) 100 UNIT/ML injection Inject 20-27 Units into the skin every morning. Per sliding scale.  EX) if CBG is 120 then take 20 units, if CBG is 200 then take 27 units   Yes Historical Provider, MD  metoCLOPramide (REGLAN) 10 MG tablet Take 1 tablet (10 mg total) by mouth every 6 (six) hours as needed for nausea (nausea/headache). 05/16/13  Yes Babette Relic, MD  omeprazole (PRILOSEC) 40 MG capsule Take 40 mg by mouth daily.   Yes Historical Provider, MD  ondansetron (ZOFRAN-ODT) 8 MG disintegrating tablet Take 8 mg by mouth every 8 (eight) hours as needed for nausea or vomiting.   Yes Historical Provider, MD  sertraline (ZOLOFT) 50 MG tablet Take 50 mg by mouth daily.  12/08/12  Yes Historical  Provider, MD  warfarin (COUMADIN) 5 MG tablet Take 2.5-5 mg by mouth See admin instructions. Take 5mg  every day except on Wednesday and Sundays take 2.5mg  04/20/13  Yes [name], MD    Physical Exam: Filed Vitals:   05/24/13 1151 05/24/13 1254 05/24/13 1300  BP: 109/57 95/63 100/65  Pulse: 96 89 89  Temp: 99.3 F (37.4 C)  97.8 F (36.6 C)  TempSrc: Oral  Oral  Resp:  24 26  SpO2: 96% 95% 94%    Physical Exam  Constitutional: Appears well-developed and well-nourished. No distress.  HENT: Normocephalic. External right and left ear normal. Oropharynx is clear and moist.  Eyes: Conjunctivae and EOM are normal. PERRLA, no scleral icterus.  Neck: Normal ROM. Neck supple. No JVD. No tracheal deviation. No thyromegaly.  CVS: RRR, S1/S2 +, no murmurs, no gallops, no carotid bruit.  Pulmonary: Effort and breath sounds normal, no stridor, rhonchi, wheezes, rales.  Abdominal: Soft. BS +,  no distension, tenderness, rebound or guarding.  Musculoskeletal: Normal range of motion. No edema and no tenderness.  Lymphadenopathy: No lymphadenopathy noted, cervical, inguinal. Neuro: Alert. Normal reflexes, muscle tone coordination. No cranial nerve deficit. Skin: Skin is warm and dry. No rash noted. Not diaphoretic. No erythema. No pallor.  Psychiatric: Normal mood and affect. Behavior, judgment, thought content normal.   Labs on Admission:  Basic Metabolic Panel:  Recent Labs Lab 05/24/13 1157  NA 135*  K 3.4*  CL 90*  CO2 18*  GLUCOSE 314*  BUN 102*  CREATININE 10.02*  CALCIUM 6.6*   Liver Function Tests:  Recent Labs Lab 05/24/13 1157  AST 26  ALT 21  ALKPHOS 136*  BILITOT 0.4  PROT 5.6*  ALBUMIN 1.7*   CBC:  Recent Labs Lab 05/24/13 1157  WBC 17.7*  NEUTROABS 15.8*  HGB 9.4*  HCT 27.5*  MCV 91.1  PLT 269   Radiological Exams on Admission: Ct Cervical Spine Wo Contrast   05/24/2013   No acute intracranial abnormality.  Mild atrophy and chronic microvascular  ischemic disease.  No acute fracture or traumatic malalignment within the cervical spine. Moderate degenerative disc disease at C5-6.     EKG: Normal sinus rhythm, no ST/T wave changes  Faye Ramsay, MD  [facility] Pager [phone]  If 7PM-7AM, please contact night-coverage [email or web address] Password [email or web address] 05/24/2013, 5:59 PM

## 2013-05-24 NOTE — ED Notes (Signed)
PD labs sent by nephrology nurse Army Chaco, RN.

## 2013-05-24 NOTE — ED Notes (Signed)
Report given to [facility]. Per Dr. Olen Pel will hold PD until tomorrow due to pt.'s Blood pressure being low at times.   Wife was at bedside and updated on pt.'s POC and current orders.  Telephone number given to call back and check up on pt.  Wife has gone home at this time to rest.

## 2013-05-24 NOTE — ED Provider Notes (Signed)
CSN: [id number]     Arrival date & time 05/24/13  1144 History   First MD Initiated Contact with Patient 05/24/13 1154     Chief Complaint  Patient presents with  . Fall  . Head Injury   (Consider location/radiation/quality/duration/timing/severity/associated sxs/prior Treatment) HPI Comments: This 58 year old male has a history of diabetes, neuropathy, hypokalemia, end stage chronic kidney disease he is a daily peritoneal dialysis patient, who presents to the Emergency department with a fall at 0900.  The patient reports falling while trying to get out of bed due to weakness.  He reports generalized weakness for weeks.  He reports he fell onto a carpeted floor.  Reports hitting his head but denies LOC.  He denies vision loss, decrease in vision or blurry vision.  He complains of an occipital headache and neck pain with movement.  Denies fever, chill, nausea or vomiting.  Last INR 4.6, less than 1 week ago.  Reports he did not complete his peritoneal dialysis yesterday due to time constraints.  Normally ambulates with a walker. PCP: [name], MD   Patient is a 58 y.o. male presenting with fall and head injury. The history is provided by the patient, medical records and the spouse. No language interpreter was used.  Fall Associated symptoms include abdominal pain, headaches, neck pain and weakness. Pertinent negatives include no arthralgias, chest pain, chills, coughing, fever, nausea, numbness or vomiting.  Head Injury Associated symptoms: headache and neck pain   Associated symptoms: no nausea, no numbness and no vomiting     Past Medical History  Diagnosis Date  . Diabetic neuropathy   . Cancer     History of Duodenal Cancer  . Chronic kidney disease     Pt is not eligible for transplant per [facility]  . Diabetes mellitus     Type 2 iddm x 11 yrs  . Anemia     esrd  . Hypertension     meds d/c 'd  2 wks ago for postural hypotension  . Fatigue      tires easily x 2 months  .  Chronic nausea     with  morning vomiting; better off  HTN meds  . Intermittent tremor   . Closed left arm fracture 1967  . Heart murmur   . Corneal abrasion, left   . Shortness of breath     exertional  . GERD (gastroesophageal reflux disease)   . History of blood transfusion   . Lack of coordination   . Muscle weakness (generalized)   . Glomerulosclerosis, diabetic   . Depression   . A-fib    Past Surgical History  Procedure Laterality Date  . Whipple procedure  2002    duodenal ca  . Bone marrow biopsy  2012  . Renal biopsy, percutaneous  2012  . Tonsillectomy    . Portacath placement  2002  . Biliary diversion, external  2002  . Other surgical history      Cyst removed from back  . Av fistula placement  06/07/2011    Procedure: ARTERIOVENOUS (AV) FISTULA CREATION;  Surgeon: Angelia Mould, MD;  Location: [place];  Service: Vascular;  Laterality: Left;  . Ligation of arteriovenous  fistula  05/22/2012    Procedure: LIGATION OF ARTERIOVENOUS  FISTULA;  Surgeon: Mal Misty, MD;  Location: [place];  Service: Vascular;  Laterality: Left;  Left brachio-cephalic fistula  . Insertion of dialysis catheter  05/22/2012    Procedure: INSERTION OF DIALYSIS CATHETER;  Surgeon: Nelda Severe  Kellie Simmering, MD;  Location: [facility];  Service: Vascular;  Laterality: N/A;  right internal jugular vein  . Av fistula placement  06/18/2012    Procedure: ARTERIOVENOUS (AV) FISTULA CREATION;  Surgeon: Mal Misty, MD;  Location: [place];  Service: Vascular;  Laterality: Right;  [place]  . Colonoscopy      Hx: of  . Capd insertion N/A 01/14/2013    Procedure: LAPAROSCOPIC INSERTION CONTINUOUS AMBULATORY PERITONEAL DIALYSIS  (CAPD) CATHETER;  Surgeon: Adin Hector, MD;  Location: [place] [facility];  Service: General;  Laterality: N/A;   Family History  Problem Relation Age of Onset  . Cancer Mother   . Cancer Father     prostate cancer  . Heart disease Maternal Grandfather   . Stroke Paternal Grandmother    History    Substance Use Topics  . Smoking status: Never Smoker   . Smokeless tobacco: Never Used  . Alcohol Use: No    Review of Systems  Constitutional: Negative for fever and chills.  Respiratory: Negative for cough.   Cardiovascular: Negative for chest pain and leg swelling.  Gastrointestinal: Positive for abdominal pain. Negative for nausea, vomiting, diarrhea and constipation.  Musculoskeletal: Positive for gait problem and neck pain. Negative for arthralgias, back pain and neck stiffness.  Neurological: Positive for weakness and headaches. Negative for syncope, facial asymmetry, speech difficulty and numbness.    Allergies  Metformin and related  Home Medications   Current Outpatient Rx  Name  Route  Sig  Dispense  Refill  . aspirin EC 81 MG tablet   Oral   Take 81 mg by mouth daily.         [name] atorvastatin (LIPITOR) 10 MG tablet   Oral   Take 10 mg by mouth daily.          . calcitRIOL (ROCALTROL) 0.25 MCG capsule   Oral   Take 0.25 mcg by mouth daily.          . calcium acetate (PHOSLO) 667 MG capsule   Oral   Take 667 mg by mouth 3 (three) times daily with meals.          . cholecalciferol (VITAMIN D) 1000 UNITS tablet   Oral   Take 1,000 Units by mouth daily.         . cyclobenzaprine (FLEXERIL) 10 MG tablet   Oral   Take 10 mg by mouth at bedtime.         [name] epoetin alfa (EPOGEN,PROCRIT) 83382 UNIT/ML injection   Subcutaneous   Inject 21,000 Units into the skin See admin instructions. 21,000 units every Monday and Friday         . gabapentin (NEURONTIN) 100 MG capsule   Oral   Take 1 capsule (100 mg total) by mouth 3 (three) times daily.   60 capsule   0   . insulin glargine (LANTUS) 100 UNIT/ML injection   Subcutaneous   Inject 20-27 Units into the skin every morning. Per sliding scale.  EX) if CBG is 120 then take 20 units, if CBG is 200 then take 27 units         . metoCLOPramide (REGLAN) 10 MG tablet   Oral   Take 1 tablet (10 mg  total) by mouth every 6 (six) hours as needed for nausea (nausea/headache).   6 tablet   0   . omeprazole (PRILOSEC) 40 MG capsule   Oral   Take 40 mg by mouth daily.         [name]  ondansetron (ZOFRAN-ODT) 8 MG disintegrating tablet   Oral   Take 8 mg by mouth every 8 (eight) hours as needed for nausea or vomiting.         . sertraline (ZOLOFT) 50 MG tablet   Oral   Take 50 mg by mouth daily.          [name] warfarin (COUMADIN) 5 MG tablet   Oral   Take 2.5-5 mg by mouth See admin instructions. Take 8m every day except on Wednesday and Sundays take 2.550m         BP 104/61  Pulse 73  Temp(Src) 99.1 F (37.3 C) (Rectal)  Resp 11  SpO2 98% Physical Exam  Nursing note and vitals reviewed. Constitutional: He is oriented to person, place, and time. He appears well-developed.  Frail male, appears older than stated age.  HENT:  Head: Normocephalic.  Mouth/Throat: No oropharyngeal exudate.  Eyes: Conjunctivae and EOM are normal. Pupils are equal, round, and reactive to light. No scleral icterus.  Neck: Normal range of motion. Neck supple.  Cardiovascular: Normal rate and regular rhythm.   Pulmonary/Chest: Effort normal and breath sounds normal. He has no wheezes. He has no rales. He exhibits no tenderness.  Complains of Left rib pain but non-tender, no crepitus.   Abdominal: Soft. There is tenderness. There is no rigidity, no rebound and no guarding.    Catheter LLQ, no erythema or tenderness to the area.  Neurological: He is alert and oriented to person, place, and time.  Skin: Skin is warm and dry. Ecchymosis and rash noted. Nails show no clubbing.  Petechial rash to distal 1/3 bilateral lower extremities, tender to palpation.  Right foot with a lesion to the dorsal aspect of the foot. Left lateral heel foot with a 1x1 cm ecchymosis.  No decrease in sensation.     ED Course  Procedures (including critical care time) Labs Review Labs Reviewed  CBC WITH DIFFERENTIAL -  Abnormal; Notable for the following:    WBC 17.7 (*)    RBC 3.02 (*)    Hemoglobin 9.4 (*)    HCT 27.5 (*)    Neutrophils Relative % 89 (*)    Lymphocytes Relative 3 (*)    Neutro Abs 15.8 (*)    Lymphs Abs 0.5 (*)    Monocytes Absolute 1.2 (*)    All other components within normal limits  COMPREHENSIVE METABOLIC PANEL - Abnormal; Notable for the following:    Sodium 135 (*)    Potassium 3.4 (*)    Chloride 90 (*)    CO2 18 (*)    Glucose, Bld 314 (*)    BUN 102 (*)    Creatinine, Ser 10.02 (*)    Calcium 6.6 (*)    Total Protein 5.6 (*)    Albumin 1.7 (*)    Alkaline Phosphatase 136 (*)    GFR calc non Af Amer 5 (*)    GFR calc Af Amer 6 (*)    All other components within normal limits  PROTIME-INR - Abnormal; Notable for the following:    Prothrombin Time 23.2 (*)    INR 2.14 (*)    All other components within normal limits  APTT - Abnormal; Notable for the following:    aPTT 43 (*)    All other components within normal limits  BODY FLUID CELL COUNT WITH DIFFERENTIAL - Abnormal; Notable for the following:    Color, Fluid STRAW (*)    Appearance, Fluid CLEAR (*)  Monocyte-Macrophage-Serous Fluid 94 (*)    All other components within normal limits  GRAM STAIN  BODY FLUID CULTURE  GLUCOSE, SEROUS FLUID   Imaging Review Ct Head Wo Contrast  05/24/2013   CLINICAL DATA:  Fall  EXAM: CT HEAD WITHOUT CONTRAST  CT CERVICAL SPINE WITHOUT CONTRAST  TECHNIQUE: Multidetector CT imaging of the head and cervical spine was performed following the standard protocol without intravenous contrast. Multiplanar CT image reconstructions of the cervical spine were also generated.  COMPARISON:  None.  FINDINGS: CT HEAD FINDINGS  Mild diffuse cerebral volume loss is seen, consistent with generalized atrophy. Scattered and confluent hypodensity within the periventricular white matter is most compatible with mild chronic microvascular ischemic disease.  There is no acute intracranial hemorrhage or  infarct. No mass lesion or midline shift. Gray-white matter differentiation is well maintained. Ventricles are normal in size without evidence of hydrocephalus. CSF containing spaces are within normal limits. No extra-axial fluid collection. Vascular calcifications noted within the carotid siphons.  The calvarium is intact.Orbital soft tissues are within normal limits.  Small calcified retention cyst noted within the left maxillary sinus. Paranasal sinuses are otherwise clear. No mastoid effusion.  Scalp soft tissues are unremarkable.  CT CERVICAL SPINE FINDINGS  The vertebral bodies are normally aligned with preservation of the normal cervical lordosis. Vertebral body heights are preserved. Normal C1-2 articulations are intact. No prevertebral soft tissue swelling. No acute fracture or listhesis.  Moderate degenerative endplate osteophytosis seen anteriorly at C5-6. Degenerative disc bulge is noted at C3-4, C4-5, and C5-6.  Visualized soft tissues of the neck are within normal limits. Visualized lung apices are clear without evidence of apical pneumothorax.  IMPRESSION: CT BRAIN:  1. No acute intracranial abnormality. 2. Mild atrophy and chronic microvascular ischemic disease.  CT CERVICAL SPINE:  1. No acute fracture or traumatic malalignment within the cervical spine. 2. Moderate degenerative disc disease at C5-6.   Electronically Signed   By: Jeannine Boga M.D.   On: 05/24/2013 14:00   Ct Cervical Spine Wo Contrast  05/24/2013   CLINICAL DATA:  Fall  EXAM: CT HEAD WITHOUT CONTRAST  CT CERVICAL SPINE WITHOUT CONTRAST  TECHNIQUE: Multidetector CT imaging of the head and cervical spine was performed following the standard protocol without intravenous contrast. Multiplanar CT image reconstructions of the cervical spine were also generated.  COMPARISON:  None.  FINDINGS: CT HEAD FINDINGS  Mild diffuse cerebral volume loss is seen, consistent with generalized atrophy. Scattered and confluent hypodensity  within the periventricular white matter is most compatible with mild chronic microvascular ischemic disease.  There is no acute intracranial hemorrhage or infarct. No mass lesion or midline shift. Gray-white matter differentiation is well maintained. Ventricles are normal in size without evidence of hydrocephalus. CSF containing spaces are within normal limits. No extra-axial fluid collection. Vascular calcifications noted within the carotid siphons.  The calvarium is intact.Orbital soft tissues are within normal limits.  Small calcified retention cyst noted within the left maxillary sinus. Paranasal sinuses are otherwise clear. No mastoid effusion.  Scalp soft tissues are unremarkable.  CT CERVICAL SPINE FINDINGS  The vertebral bodies are normally aligned with preservation of the normal cervical lordosis. Vertebral body heights are preserved. Normal C1-2 articulations are intact. No prevertebral soft tissue swelling. No acute fracture or listhesis.  Moderate degenerative endplate osteophytosis seen anteriorly at C5-6. Degenerative disc bulge is noted at C3-4, C4-5, and C5-6.  Visualized soft tissues of the neck are within normal limits. Visualized lung apices are clear without  evidence of apical pneumothorax.  IMPRESSION: CT BRAIN:  1. No acute intracranial abnormality. 2. Mild atrophy and chronic microvascular ischemic disease.  CT CERVICAL SPINE:  1. No acute fracture or traumatic malalignment within the cervical spine. 2. Moderate degenerative disc disease at C5-6.   Electronically Signed   By: Jeannine Boga M.D.   On: 05/24/2013 14:00    EKG Interpretation    Date/Time:  Monday May 24 2013 12:53:22 EST Ventricular Rate:  90 PR Interval:  160 QRS Duration: 80 QT Interval:  407 QTC Calculation: 498 R Axis:   15 Text Interpretation:  Age not entered, assumed to be  58 years old for purpose of ECG interpretation Sinus rhythm Low voltage, precordial leads Nonspecific T abnormalities, lateral  leads Borderline prolonged QT interval t wave changes are more pronounced compared to prior.   Confirmed by [facility]  MD, TREY ([id number]) on 05/24/2013 2:45:21 PM            MDM   1. Long term (current) use of anticoagulants   2. Anemia   3. Atrial fibrillation     Pt with a fall due to generalized weakness. No neurologic findings on exam, mild tenderness with palpation to head.  Pt on coumadin, will CT. Labs and pain medication ordered.   CT without acute findings, C-spine without fracture of malalignment. CMP- multiple electrolyte abnormalities, Cr and BUN are close to baseline. History of hypokalemia, K-3.4, will not replete in the ED. Glucose 314. Hbg- 9.4-stable.  Discussed patient history, condition, and labs with Dr.Wofford.  After his encounter with the patient suggest with WBC 13.7 and abdominal pain with palpation on his exam analysis of peritoneal fluid.  Attempted to ambulate the patient in the ED and the patient was too weak to stand.  Consulted Dr. Tressa Busman to admit the patient for weakness.  Discussed lab results, imaging results, and treatment plan with the patient and patient's wife.  Reports understanding and no other concerns at this time.    Meds given in ED:  Medications  morphine 4 MG/ML injection 4 mg (4 mg Intravenous Given 05/24/13 1308)  morphine 4 MG/ML injection 2 mg (2 mg Intravenous Given 05/24/13 1742)    New Prescriptions   No medications on file      Lorrine Kin, PA-C 05/24/13 1939

## 2013-05-24 NOTE — ED Notes (Signed)
[id number]-[id number]. Notified RN

## 2013-05-25 ENCOUNTER — Encounter (HOSPITAL_COMMUNITY): Payer: Self-pay | Admitting: *Deleted

## 2013-05-25 DIAGNOSIS — F329 Major depressive disorder, single episode, unspecified: Secondary | ICD-10-CM

## 2013-05-25 DIAGNOSIS — K219 Gastro-esophageal reflux disease without esophagitis: Secondary | ICD-10-CM

## 2013-05-25 DIAGNOSIS — E119 Type 2 diabetes mellitus without complications: Secondary | ICD-10-CM

## 2013-05-25 DIAGNOSIS — N186 End stage renal disease: Secondary | ICD-10-CM

## 2013-05-25 DIAGNOSIS — F3289 Other specified depressive episodes: Secondary | ICD-10-CM

## 2013-05-25 LAB — BASIC METABOLIC PANEL
BUN: 118 mg/dL — ABNORMAL HIGH (ref 6–23)
CO2: 19 mEq/L (ref 19–32)
Calcium: 5.9 mg/dL — CL (ref 8.4–10.5)
Chloride: 88 mEq/L — ABNORMAL LOW (ref 96–112)
Creatinine, Ser: 10.65 mg/dL — ABNORMAL HIGH (ref 0.50–1.35)
GFR calc non Af Amer: 5 mL/min — ABNORMAL LOW (ref >90)
GFR, EST AFRICAN AMERICAN: 5 mL/min — AB (ref >90)
Glucose, Bld: 263 mg/dL — ABNORMAL HIGH (ref 70–99)
POTASSIUM: 3.6 meq/L — AB (ref 3.7–5.3)
SODIUM: 134 meq/L — AB (ref 137–147)

## 2013-05-25 LAB — CBC
HCT: 25.1 % — ABNORMAL LOW (ref 39.0–52.0)
HEMOGLOBIN: 8.6 g/dL — AB (ref 13.0–17.0)
MCH: 31.3 pg (ref 26.0–34.0)
MCHC: 34.3 g/dL (ref 30.0–36.0)
MCV: 91.3 fL (ref 78.0–100.0)
Platelets: 278 10*3/uL (ref 150–400)
RBC: 2.75 MIL/uL — ABNORMAL LOW (ref 4.22–5.81)
RDW: 14.7 % (ref 11.5–15.5)
WBC: 15.4 10*3/uL — AB (ref 4.0–10.5)

## 2013-05-25 LAB — PATHOLOGIST SMEAR REVIEW

## 2013-05-25 LAB — PROTIME-INR
INR: 2.32 — AB (ref 0.00–1.49)
PROTHROMBIN TIME: 24.7 s — AB (ref 11.6–15.2)

## 2013-05-25 LAB — GLUCOSE, CAPILLARY
GLUCOSE-CAPILLARY: 187 mg/dL — AB (ref 70–99)
Glucose-Capillary: 191 mg/dL — ABNORMAL HIGH (ref 70–99)
Glucose-Capillary: 124 mg/dL — ABNORMAL HIGH (ref 70–99)

## 2013-05-25 LAB — HEPATITIS B CORE ANTIBODY, TOTAL: HEP B C TOTAL AB: NONREACTIVE

## 2013-05-25 LAB — HEPATITIS B SURFACE ANTIGEN: Hepatitis B Surface Ag: NEGATIVE

## 2013-05-25 LAB — INFLUENZA PANEL BY PCR (TYPE A & B)
H1N1FLUPCR: NOT DETECTED
INFLBPCR: NEGATIVE
Influenza A By PCR: NEGATIVE

## 2013-05-25 LAB — HEMOGLOBIN A1C
Hgb A1c MFr Bld: 6.7 % — ABNORMAL HIGH (ref <5.7)
MEAN PLASMA GLUCOSE: 146 mg/dL — AB (ref <117)

## 2013-05-25 LAB — HEPATITIS B SURFACE ANTIBODY,QUALITATIVE: Hep B S Ab: POSITIVE — AB

## 2013-05-25 MED ORDER — INSULIN GLARGINE 100 UNIT/ML ~~LOC~~ SOLN
15.0000 [IU] | Freq: Every day | SUBCUTANEOUS | Status: DC
  Administered 2013-05-25 – 2013-05-28 (×4): 15 [IU] via SUBCUTANEOUS
  Filled 2013-05-25 (×5): qty 0.15

## 2013-05-25 MED ORDER — NEPRO/CARBSTEADY PO LIQD
237.0000 mL | Freq: Two times a day (BID) | ORAL | Status: DC
Start: 2013-05-25 — End: 2013-05-26
  Administered 2013-05-26: 237 mL via ORAL
  Filled 2013-05-25: qty 237

## 2013-05-25 MED ORDER — ALBUMIN HUMAN 25 % IV SOLN
25.0000 g | Freq: Once | INTRAVENOUS | Status: AC
  Administered 2013-05-25: 25 g via INTRAVENOUS

## 2013-05-25 MED ORDER — DARBEPOETIN ALFA-POLYSORBATE 200 MCG/0.4ML IJ SOLN
INTRAMUSCULAR | Status: AC
  Administered 2013-05-25: 200 ug via INTRAVENOUS
  Filled 2013-05-25: qty 0.4

## 2013-05-25 MED ORDER — MIDODRINE HCL 5 MG PO TABS
ORAL_TABLET | ORAL | Status: AC
  Filled 2013-05-25: qty 2

## 2013-05-25 MED ORDER — RENA-VITE PO TABS
1.0000 | ORAL_TABLET | Freq: Every day | ORAL | Status: DC
  Administered 2013-05-25: 23:00:00 via ORAL
  Administered 2013-05-26 – 2013-05-30 (×5): 1 via ORAL
  Filled 2013-05-25 (×7): qty 1

## 2013-05-25 MED ORDER — ALBUMIN HUMAN 25 % IV SOLN
INTRAVENOUS | Status: AC
  Administered 2013-05-25: 25 g via INTRAVENOUS
  Filled 2013-05-25: qty 50

## 2013-05-25 MED ORDER — SERTRALINE HCL 100 MG PO TABS
100.0000 mg | ORAL_TABLET | Freq: Every day | ORAL | Status: DC
  Administered 2013-05-26 – 2013-05-31 (×6): 100 mg via ORAL
  Filled 2013-05-25 (×6): qty 1

## 2013-05-25 MED ORDER — DARBEPOETIN ALFA-POLYSORBATE 200 MCG/0.4ML IJ SOLN
200.0000 ug | INTRAMUSCULAR | Status: DC
  Administered 2013-05-25: 200 ug via INTRAVENOUS
  Filled 2013-05-25: qty 0.4

## 2013-05-25 MED ORDER — MIDODRINE HCL 5 MG PO TABS
10.0000 mg | ORAL_TABLET | Freq: Three times a day (TID) | ORAL | Status: DC
  Administered 2013-05-25 – 2013-05-31 (×15): 10 mg via ORAL
  Filled 2013-05-25 (×20): qty 2

## 2013-05-25 MED ORDER — ALBUMIN HUMAN 25 % IV SOLN
INTRAVENOUS | Status: AC
  Filled 2013-05-25: qty 50

## 2013-05-25 MED ORDER — CALCIUM ACETATE 667 MG PO CAPS
2001.0000 mg | ORAL_CAPSULE | Freq: Three times a day (TID) | ORAL | Status: DC
  Administered 2013-05-26 – 2013-05-31 (×16): 2001 mg via ORAL
  Filled 2013-05-25 (×21): qty 3

## 2013-05-25 MED ORDER — WARFARIN SODIUM 2.5 MG PO TABS
2.5000 mg | ORAL_TABLET | ORAL | Status: DC
  Administered 2013-05-26: 2.5 mg via ORAL
  Filled 2013-05-25: qty 1

## 2013-05-25 MED ORDER — WARFARIN SODIUM 5 MG PO TABS
5.0000 mg | ORAL_TABLET | ORAL | Status: DC
  Administered 2013-05-25: 5 mg via ORAL
  Filled 2013-05-25 (×2): qty 1

## 2013-05-25 NOTE — Consult Note (Addendum)
Reason for Consult:Management of dialysis, anemia, secondary hyperpara and dialysis related issues Referring Physician: EDP Primary Nephrologist: Dr. Lorrene Reid HPI: Paul [name] is a 58 y.o. male with a history of ESRD secondary to DM (has been on home hemo, incenter hemo, and most recently peritoneal dialysis).  He has been to the ED 3 times within the last 6 weeks  - on 04/13/13 with hypotension - was felt too dry.  Went to all 1.5% glucose dialysate.  Seen again 12/27 with LE pain and edema, with nearly 13 lb weight gain.  Was placed on gabapentin and he increased his exchanges to all 4.24% dialysate.  Weight came downto about 84.5 kg and BP remained soft. TSH and cortisol were normal as outpt on 12/292/14.  Pt subsequently states he "lost all the strength in his legs".  Stayed in the same clothes for a week.  Missed some dialysis exchanges.  Had some falls, with associated headache and neck pain. Also reported abdominal pain on presentation to the ED,  but denies that to me. PD fluid has been clear.  CT of brain just showed atrophy and small vessel disease.  C-spine no fracture.  PD fluid cell count not indicative of peritonitis.  Lab abnormalities included BUN > 100, creatinine 10.65, K 3.4 (3.6 today), WBC of 15,400, Hb of 8.6 (also 8.8 as outpt on 12/19), albumin of 1.7 (down from 2.6 12/4, 3.3 11/3 and 4.2 10/9)  He is to be admitted for further evaluation.   Dialyzes at home on PD since 02/2013 (prior to that home hemo and brief period of in center hemo)  Primary Nephrologist Aviya Jarvie EDW 85 PD = 6 exchanges/night of 3 liters; last fill 2500, pause 2500; currently using all 2.5% dialysate with soft BP's but peripheral edema Accesses =  PD cath and RUE AVF EPO = 21,000 units twice/week SQ  Past Medical History  Diagnosis Date  . Diabetic neuropathy   . Cancer     History of Duodenal Cancer  . Chronic kidney disease     Pt is not eligible for transplant per [facility]  . Diabetes mellitus      Type 2 iddm x 11 yrs  . Anemia     esrd  . Hypertension     meds d/c 'd  2 wks ago for postural hypotension  . Fatigue      tires easily x 2 months  . Chronic nausea     with  morning vomiting; better off  HTN meds  . Intermittent tremor   . Closed left arm fracture 1967  . Heart murmur   . Corneal abrasion, left   . Shortness of breath     exertional  . GERD (gastroesophageal reflux disease)   . History of blood transfusion   . Lack of coordination   . Muscle weakness (generalized)   . Glomerulosclerosis, diabetic   . Depression   . A-fib     Calcitriol 0.25 mcg/day po. Epogen 21,000 twice a week SQ  Allergies  Allergen Reactions  . Metformin And Related Other (See Comments)    Kidney problems    Past Surgical History  Procedure Laterality Date  . Whipple procedure  2002    duodenal ca  . Bone marrow biopsy  2012  . Renal biopsy, percutaneous  2012  . Tonsillectomy    . Portacath placement  2002  . Biliary diversion, external  2002  . Other surgical history      Cyst removed from back  .  Av fistula placement  06/07/2011    Procedure: ARTERIOVENOUS (AV) FISTULA CREATION;  Surgeon: Angelia Mould, MD;  Location: [place];  Service: Vascular;  Laterality: Left;  . Ligation of arteriovenous  fistula  05/22/2012    Procedure: LIGATION OF ARTERIOVENOUS  FISTULA;  Surgeon: Mal Misty, MD;  Location: [place];  Service: Vascular;  Laterality: Left;  Left brachio-cephalic fistula  . Insertion of dialysis catheter  05/22/2012    Procedure: INSERTION OF DIALYSIS CATHETER;  Surgeon: Mal Misty, MD;  Location: [place];  Service: Vascular;  Laterality: N/A;  right internal jugular vein  . Av fistula placement  06/18/2012    Procedure: ARTERIOVENOUS (AV) FISTULA CREATION;  Surgeon: Mal Misty, MD;  Location: [place];  Service: Vascular;  Laterality: Right;  [place]  . Colonoscopy      Hx: of  . Capd insertion N/A 01/14/2013    Procedure: LAPAROSCOPIC INSERTION CONTINUOUS  AMBULATORY PERITONEAL DIALYSIS  (CAPD) CATHETER;  Surgeon: Adin Hector, MD;  Location: [place] [facility];  Service: General;  Laterality: N/A;    Family History  Problem Relation Age of Onset  . Cancer Mother   . Cancer Father     prostate cancer  . Heart disease Maternal Grandfather   . Stroke Paternal Grandmother     Social History:  reports that he has never smoked. He has never used smokeless tobacco. He reports that he does not drink alcohol or use illicit drugs.  ROS + weakness +headache No nausea, vomiting, diarrhea + falls/legs give way No bleeding No chilss or fever + leg pain with erythematous eruption on LE's just above ankles that has been there since his 12/27 ED visit + depression/apathy "feel so bad I just don't care"   Blood pressure 115/64, pulse 70, temperature 98 F (36.7 C), temperature source Oral, resp. rate 14, height 6' 2.02" (1.88 m), weight 86 kg (189 lb 9.5 oz), SpO2 98.00%.  Physical Exam BP 115/64  Pulse 70  Temp(Src) 98 F (36.7 C) (Oral)  Resp 14  Ht 6' 2.02" (1.88 m)  Wt 86 kg (189 lb 9.5 oz)  BMI 24.33 kg/m2  SpO2 98% Extremely flat affect Monotone voice No JVD Lungs clear S1S2 No S3 regular today Abd with PD catheter - exit site clean and dry; no abd tenderness; scars from prev duod CA surgery Extremities with 1+ edema; thenar and hypothenar wasting Erythematous ruburous eruption bolat LE's just above level of ankle; tenderness noted Right FA AVF with good bruit and thrill   Results for orders placed during the hospital encounter of 05/24/13 (from the past 48 hour(s))  CBC WITH DIFFERENTIAL     Status: Abnormal   Collection Time    05/24/13 11:57 AM      Result Value Range   WBC 17.7 (*) 4.0 - 10.5 K/uL   RBC 3.02 (*) 4.22 - 5.81 MIL/uL   Hemoglobin 9.4 (*) 13.0 - 17.0 g/dL   HCT 27.5 (*) 39.0 - 52.0 %   MCV 91.1  78.0 - 100.0 fL   MCH 31.1  26.0 - 34.0 pg   MCHC 34.2  30.0 - 36.0 g/dL   RDW 14.5  11.5 - 15.5 %   Platelets 269   150 - 400 K/uL   Neutrophils Relative % 89 (*) 43 - 77 %   Lymphocytes Relative 3 (*) 12 - 46 %   Monocytes Relative 7  3 - 12 %   Eosinophils Relative 1  0 - 5 %  Basophils Relative 0  0 - 1 %   Neutro Abs 15.8 (*) 1.7 - 7.7 K/uL   Lymphs Abs 0.5 (*) 0.7 - 4.0 K/uL   Monocytes Absolute 1.2 (*) 0.1 - 1.0 K/uL   Eosinophils Absolute 0.2  0.0 - 0.7 K/uL   Basophils Absolute 0.0  0.0 - 0.1 K/uL   Smear Review MORPHOLOGY UNREMARKABLE    COMPREHENSIVE METABOLIC PANEL     Status: Abnormal   Collection Time    05/24/13 11:57 AM      Result Value Range   Sodium 135 (*) 137 - 147 mEq/L   Potassium 3.4 (*) 3.7 - 5.3 mEq/L   Chloride 90 (*) 96 - 112 mEq/L   CO2 18 (*) 19 - 32 mEq/L   Glucose, Bld 314 (*) 70 - 99 mg/dL   BUN 102 (*) 6 - 23 mg/dL   Creatinine, Ser 10.02 (*) 0.50 - 1.35 mg/dL   Calcium 6.6 (*) 8.4 - 10.5 mg/dL   Total Protein 5.6 (*) 6.0 - 8.3 g/dL   Albumin 1.7 (*) 3.5 - 5.2 g/dL   AST 26  0 - 37 U/L   ALT 21  0 - 53 U/L   Alkaline Phosphatase 136 (*) 39 - 117 U/L   Total Bilirubin 0.4  0.3 - 1.2 mg/dL   GFR calc non Af Amer 5 (*) >90 mL/min   GFR calc Af Amer 6 (*) >90 mL/min   Comment: (NOTE)     The eGFR has been calculated using the CKD EPI equation.     This calculation has not been validated in all clinical situations.     eGFR's persistently <90 mL/min signify possible Chronic Kidney     Disease.  PROTIME-INR     Status: Abnormal   Collection Time    05/24/13 11:57 AM      Result Value Range   Prothrombin Time 23.2 (*) 11.6 - 15.2 seconds   INR 2.14 (*) 0.00 - 1.49  APTT     Status: Abnormal   Collection Time    05/24/13 11:57 AM      Result Value Range   aPTT 43 (*) 24 - 37 seconds   Comment:            IF BASELINE aPTT IS ELEVATED,     SUGGEST PATIENT RISK ASSESSMENT     BE USED TO DETERMINE APPROPRIATE     ANTICOAGULANT THERAPY.  BODY FLUID CULTURE     Status: None   Collection Time    05/24/13  6:04 PM      Result Value Range   Specimen  Description PERITONEAL ABDOMEN     Special Requests FROM PD PORT     Gram Stain       Value: RARE WBC PRESENT,BOTH PMN AND MONONUCLEAR     NO ORGANISMS SEEN     Performed at [facility]     Performed at [facility]   Culture       Value: NO GROWTH     Performed at [facility]   Report Status PENDING    GRAM STAIN     Status: None   Collection Time    05/24/13  6:04 PM      Result Value Range   Specimen Description PERITONEAL ABDOMEN     Special Requests PD PORT     Gram Stain       Value: RARE WBC PRESENT,BOTH PMN AND MONONUCLEAR     NO ORGANISMS  SEEN   Report Status 05/24/2013 FINAL    GLUCOSE, SEROUS FLUID     Status: None   Collection Time    05/24/13  6:04 PM      Result Value Range   Glucose, Fluid 629     Comment:            FLUID GLUCOSE LEVELS OF <60     mg/dL OR VALUES OF 40 mg/dL     LESS THAN A SIMULTANEOUS     SERUM LEVEL ARE CONSIDERED     DECREASED.   Fluid Type-FGLU PERITONEAL     Comment: ABDOMEN  BODY FLUID CELL COUNT WITH DIFFERENTIAL     Status: Abnormal   Collection Time    05/24/13  6:05 PM      Result Value Range   Fluid Type-FCT PERITONEAL     Comment: ABDOMEN   Color, Fluid STRAW (*) YELLOW   Appearance, Fluid CLEAR (*) CLEAR   WBC, Fluid 2  0 - 1000 cu mm   Neutrophil Count, Fluid 4  0 - 25 %   Lymphs, Fluid 2     Monocyte-Macrophage-Serous Fluid 94 (*) 50 - 90 %  PATHOLOGIST SMEAR REVIEW     Status: None   Collection Time    05/24/13  6:05 PM      Result Value Range   Path Review LEUKOCYTES.     Comment: Reviewed by Chrystie Nose. Saralyn Pilar, M.D.     05/25/13.  INFLUENZA PANEL BY PCR     Status: None   Collection Time    05/24/13  8:37 PM      Result Value Range   Influenza A By PCR NEGATIVE  NEGATIVE   Influenza B By PCR NEGATIVE  NEGATIVE   H1N1 flu by pcr NOT DETECTED  NOT DETECTED   Comment:            The Xpert Flu assay (FDA approved for     nasal aspirates or washes and     nasopharyngeal swab specimens),  is     intended as an aid in the diagnosis of     influenza and should not be used as     a sole basis for treatment.  GLUCOSE, CAPILLARY     Status: Abnormal   Collection Time    05/24/13 11:15 PM      Result Value Range   Glucose-Capillary 155 (*) 70 - 99 mg/dL  BASIC METABOLIC PANEL     Status: Abnormal   Collection Time    05/25/13  3:14 AM      Result Value Range   Sodium 134 (*) 137 - 147 mEq/L   Potassium 3.6 (*) 3.7 - 5.3 mEq/L   Chloride 88 (*) 96 - 112 mEq/L   CO2 19  19 - 32 mEq/L   Glucose, Bld 263 (*) 70 - 99 mg/dL   BUN 118 (*) 6 - 23 mg/dL   Creatinine, Ser 10.65 (*) 0.50 - 1.35 mg/dL   Calcium 5.9 (*) 8.4 - 10.5 mg/dL   Comment: CRITICAL RESULT CALLED TO, READ BACK BY AND VERIFIED WITH:     LULIS,A RN 05/25/2013 0356 JORDANS     REPEATED TO VERIFY   GFR calc non Af Amer 5 (*) >90 mL/min   GFR calc Af Amer 5 (*) >90 mL/min   Comment: (NOTE)     The eGFR has been calculated using the CKD EPI equation.     This calculation has not been validated in  all clinical situations.     eGFR's persistently <90 mL/min signify possible Chronic Kidney     Disease.  CBC     Status: Abnormal   Collection Time    05/25/13  3:14 AM      Result Value Range   WBC 15.4 (*) 4.0 - 10.5 K/uL   RBC 2.75 (*) 4.22 - 5.81 MIL/uL   Hemoglobin 8.6 (*) 13.0 - 17.0 g/dL   HCT 25.1 (*) 39.0 - 52.0 %   MCV 91.3  78.0 - 100.0 fL   MCH 31.3  26.0 - 34.0 pg   MCHC 34.3  30.0 - 36.0 g/dL   RDW 14.7  11.5 - 15.5 %   Platelets 278  150 - 400 K/uL  PROTIME-INR     Status: Abnormal   Collection Time    05/25/13  3:14 AM      Result Value Range   Prothrombin Time 24.7 (*) 11.6 - 15.2 seconds   INR 2.32 (*) 0.00 - 1.49  GLUCOSE, CAPILLARY     Status: Abnormal   Collection Time    05/25/13  7:47 AM      Result Value Range   Glucose-Capillary 191 (*) 70 - 99 mg/dL  GLUCOSE, CAPILLARY     Status: Abnormal   Collection Time    05/25/13  9:02 AM      Result Value Range   Glucose-Capillary 187  (*) 70 - 99 mg/dL  GLUCOSE, CAPILLARY     Status: Abnormal   Collection Time    05/25/13  1:09 PM      Result Value Range   Glucose-Capillary 124 (*) 70 - 99 mg/dL    Ct Head Wo Contrast  05/24/2013   CLINICAL DATA:  Fall  EXAM: CT HEAD WITHOUT CONTRAST  CT CERVICAL SPINE WITHOUT CONTRAST  TECHNIQUE: Multidetector CT imaging of the head and cervical spine was performed following the standard protocol without intravenous contrast. Multiplanar CT image reconstructions of the cervical spine were also generated.  COMPARISON:  None.  FINDINGS: CT HEAD FINDINGS  Mild diffuse cerebral volume loss is seen, consistent with generalized atrophy. Scattered and confluent hypodensity within the periventricular white matter is most compatible with mild chronic microvascular ischemic disease.  There is no acute intracranial hemorrhage or infarct. No mass lesion or midline shift. Gray-white matter differentiation is well maintained. Ventricles are normal in size without evidence of hydrocephalus. CSF containing spaces are within normal limits. No extra-axial fluid collection. Vascular calcifications noted within the carotid siphons.  The calvarium is intact.Orbital soft tissues are within normal limits.  Small calcified retention cyst noted within the left maxillary sinus. Paranasal sinuses are otherwise clear. No mastoid effusion.  Scalp soft tissues are unremarkable.  CT CERVICAL SPINE FINDINGS  The vertebral bodies are normally aligned with preservation of the normal cervical lordosis. Vertebral body heights are preserved. Normal C1-2 articulations are intact. No prevertebral soft tissue swelling. No acute fracture or listhesis.  Moderate degenerative endplate osteophytosis seen anteriorly at C5-6. Degenerative disc bulge is noted at C3-4, C4-5, and C5-6.  Visualized soft tissues of the neck are within normal limits. Visualized lung apices are clear without evidence of apical pneumothorax.  IMPRESSION: CT BRAIN:  1. No  acute intracranial abnormality. 2. Mild atrophy and chronic microvascular ischemic disease.  CT CERVICAL SPINE:  1. No acute fracture or traumatic malalignment within the cervical spine. 2. Moderate degenerative disc disease at C5-6.   Electronically Signed   By: Pincus Badder.D.  On: 05/24/2013 14:00   Ct Cervical Spine Wo Contrast  05/24/2013   CLINICAL DATA:  Fall  EXAM: CT HEAD WITHOUT CONTRAST  CT CERVICAL SPINE WITHOUT CONTRAST  TECHNIQUE: Multidetector CT imaging of the head and cervical spine was performed following the standard protocol without intravenous contrast. Multiplanar CT image reconstructions of the cervical spine were also generated.  COMPARISON:  None.  FINDINGS: CT HEAD FINDINGS  Mild diffuse cerebral volume loss is seen, consistent with generalized atrophy. Scattered and confluent hypodensity within the periventricular white matter is most compatible with mild chronic microvascular ischemic disease.  There is no acute intracranial hemorrhage or infarct. No mass lesion or midline shift. Gray-white matter differentiation is well maintained. Ventricles are normal in size without evidence of hydrocephalus. CSF containing spaces are within normal limits. No extra-axial fluid collection. Vascular calcifications noted within the carotid siphons.  The calvarium is intact.Orbital soft tissues are within normal limits.  Small calcified retention cyst noted within the left maxillary sinus. Paranasal sinuses are otherwise clear. No mastoid effusion.  Scalp soft tissues are unremarkable.  CT CERVICAL SPINE FINDINGS  The vertebral bodies are normally aligned with preservation of the normal cervical lordosis. Vertebral body heights are preserved. Normal C1-2 articulations are intact. No prevertebral soft tissue swelling. No acute fracture or listhesis.  Moderate degenerative endplate osteophytosis seen anteriorly at C5-6. Degenerative disc bulge is noted at C3-4, C4-5, and C5-6.  Visualized soft  tissues of the neck are within normal limits. Visualized lung apices are clear without evidence of apical pneumothorax.  IMPRESSION: CT BRAIN:  1. No acute intracranial abnormality. 2. Mild atrophy and chronic microvascular ischemic disease.  CT CERVICAL SPINE:  1. No acute fracture or traumatic malalignment within the cervical spine. 2. Moderate degenerative disc disease at C5-6.   Electronically Signed   By: Jeannine Boga M.D.   On: 05/24/2013 14:00   Dg Chest Port 1 View  05/24/2013   CLINICAL DATA:  Fever and leukocytosis.  Fall  EXAM: PORTABLE CHEST - 1 VIEW  COMPARISON:  04/16/2013  FINDINGS: The heart size and mediastinal contours are within normal limits for technique. Both lungs are clear. No effusion or pneumothorax. The visualized skeletal structures are unremarkable.  IMPRESSION: No active disease.   Electronically Signed   By: Jorje Guild M.D.   On: 05/24/2013 21:52   ASSESSMENT/RECOMMENDATIONS  ESRD - failure to thrive on PD; has generally gone downhill past 6-8 weeks; severely hypoalbuminemic; has done poorly managing volume at home; frequent ER visits; has been missing exchanges due to feeling poorly (and I think apathy/depression).   Would benefit from return to HD until current issues resolved and nutritional status improved.  Has patent RUE AVF.   Plan HD today and will plan for a period of outpt HD to see if FTT and malnutrition improve.  Pt in agreement.  Continue to change PD cath dressings and flush catheter weekly. Drain current PD fluid. CLIP for outpt HD  Generalized weakness/falls - perhaps in part due to soft BP's - has been recent problem - and low 100's despite presence of edema. (low alb contributes to that); add midodrine for BP (TSH and cortisol both normal as outpt and 2D echo 03/2013 normal EF). Ask PT to see  DM - per primary  Leukocytosis -  Somewhat improved today; on empiric rocephin and azithro; f/u CBC in AM  Anemia - no overt bleeding; change SQ  EPO to IV with HD (wll have to use ARANESP in hosp)  Malnutrition - supplements (Nepro BID)  AF - regular today, rate controlled; on AMIO and coumadin  Depression - has been on zoloft 50/day -  affect very flat. Monotone voice. Will increase to 100/day; ? If would benefit from neuro consult to r/o medical issues like Parkinson's or dementia;     Symphanie Cederberg B 05/25/2013, 1:32 PM

## 2013-05-25 NOTE — Procedures (Signed)
I have personally attended this patient's dialysis session.  R AVF 400 BFR 16 gu needles. 4K bath. (K 3.6)No vol off.  Midodrine started (10 TID)    Klein Willcox B

## 2013-05-25 NOTE — Progress Notes (Signed)
ANTICOAGULATION CONSULT NOTE - Follow-up Consult  Pharmacy Consult:  Coumadin Indication: atrial fibrillation  Allergies  Allergen Reactions  . Metformin And Related Other (See Comments)    Kidney problems    Patient Measurements: Height: 6' 2.02" (188 cm) Weight: 189 lb 9.5 oz (86 kg) IBW/kg (Calculated) : 82.24  Vital Signs: Temp: 98 F (36.7 C) (01/06 0401) Temp src: Oral (01/06 0401) BP: 115/64 mmHg (01/06 1146) Pulse Rate: 70 (01/06 1146)  Labs:  Recent Labs  05/24/13 1157 05/25/13 0314  HGB 9.4* 8.6*  HCT 27.5* 25.1*  PLT 269 278  APTT 43*  --   LABPROT 23.2* 24.7*  INR 2.14* 2.32*  CREATININE 10.02* 10.65*    Estimated Creatinine Clearance: 8.7 ml/min (by C-G formula based on Cr of 10.65).  Assessment: 50 YOM on Coumadin for h/o afib. Pt's INR remains therapeutic on home regimen. Hgb down to 8.6 - watch. No bleeding reported.  Goal of Therapy:  INR 2-3 Monitor platelets by anticoagulation protocol: Yes    Plan:  - Restart home Coumadin 5mg  daily except for 2.5mg  po on Wed and Sun - F/U daily INR  Sherlon Handing, PharmD, BCPS Clinical pharmacist, pager [phone] 05/25/2013, 2:05 PM

## 2013-05-25 NOTE — Progress Notes (Signed)
Physical Therapy  Note   Orders received, chart reviewed  Noted MD note from last evening stating, "PT eval once more medically stable"  Paged Dr. Wynetta Emery re: whether PT eval is appropriate today  Thank you,  Roney Marion, [place]

## 2013-05-25 NOTE — ED Notes (Signed)
Mr. Saldutti covering drs call back, received the calium level dr. Aletha Halim investigate results

## 2013-05-25 NOTE — Progress Notes (Signed)
[facility] PROGRESS NOTE  Paul [name] [id number] DOB: Aug 25, 1955 DOA: 05/24/2013 PCP: [name], MD  Assessment/Plan: Generalized weakness with subsequent fall  - unclear etiology, review of records indicate similar events in the past due to low BP  - BP on this admission initially 90/60 with slight improvement one NS bolus to 100/60  - nephrology has started patient on midodrine - follow telemetry  - will need PT evaluation tomorrow  - monitor vitals and labs   ESRD  - nephrology team has started patient on hemodialysis    Diabetes mellitus  - will check A1C  - place on SSI for now with meal and night coverage  - uncontrolled at this time - restart basal insulin    Hypokalemia  - mild, repeat BMP in AM   Leukocytosis  - unclear etiology but with soft BP and low grade fever certainly possible infectious etiology  - CXR neg for acute, PD fluid - no signs of infection  - placed on Zithromax and Rocephin for now, but likely will DC if no source of infection determined - repeat CBCdiff in AM   Anemia of chronic disease  - baseline Hg appears to be 8-9  - no signs of active bleed  - repeat CBC in AM   A-fib  - continue Coumadin per pharmacy   Code Status: Full  Family Communication: Pt at bedside  Disposition Plan: Admit to medical floor    HPI/Subjective: Pt seen in HD, tolerating well, no complaints  Objective: Filed Vitals:   05/25/13 1630  BP: 111/78  Pulse: 95  Temp:   Resp: 16   No intake or output data in the 24 hours ending 05/25/13 1739 Filed Weights   05/24/13 2148  Weight: 189 lb 9.5 oz (86 kg)    Exam:  Constitutional: Appears well-developed and well-nourished. No distress.  HENT: Normocephalic. External right and left ear normal. Oropharynx is clear and moist.  Eyes: Conjunctivae and EOM are normal. PERRLA, no scleral icterus.  Neck: Normal ROM. Neck supple. No JVD. No tracheal deviation. No thyromegaly.  CVS: irreg, irreg   Pulmonary: Effort and breath sounds normal, no stridor, rhonchi, wheezes, rales.  Abdominal: Soft. BS +, no distension, tenderness, rebound or guarding.  Musculoskeletal: Normal range of motion. No edema and no tenderness.  Neuro: Alert. Normal reflexes, muscle tone coordination. No cranial nerve deficit.  Skin: Skin is warm and dry. No rash noted. Not diaphoretic. No erythema. No pallor.  Psychiatric: flat affect noted.   Data Reviewed: Basic Metabolic Panel:  Recent Labs Lab 05/24/13 1157 05/25/13 0314  NA 135* 134*  K 3.4* 3.6*  CL 90* 88*  CO2 18* 19  GLUCOSE 314* 263*  BUN 102* 118*  CREATININE 10.02* 10.65*  CALCIUM 6.6* 5.9*   Liver Function Tests:  Recent Labs Lab 05/24/13 1157  AST 26  ALT 21  ALKPHOS 136*  BILITOT 0.4  PROT 5.6*  ALBUMIN 1.7*   No results found for this basename: LIPASE, AMYLASE,  in the last 168 hours No results found for this basename: AMMONIA,  in the last 168 hours CBC:  Recent Labs Lab 05/24/13 1157 05/25/13 0314  WBC 17.7* 15.4*  NEUTROABS 15.8*  --   HGB 9.4* 8.6*  HCT 27.5* 25.1*  MCV 91.1 91.3  PLT 269 278   Cardiac Enzymes: No results found for this basename: CKTOTAL, CKMB, CKMBINDEX, TROPONINI,  in the last 168 hours BNP (last 3 results)  Recent Labs  04/02/13 1217  PROBNP 12548.0*  CBG:  Recent Labs Lab 05/24/13 2315 05/25/13 0747 05/25/13 0902 05/25/13 1309  GLUCAP 155* 191* 187* 124*    Recent Results (from the past 240 hour(s))  BODY FLUID CULTURE     Status: None   Collection Time    05/24/13  6:04 PM      Result Value Range Status   Specimen Description PERITONEAL ABDOMEN   Final   Special Requests FROM PD PORT   Final   Gram Stain     Final   Value: RARE WBC PRESENT,BOTH PMN AND MONONUCLEAR     NO ORGANISMS SEEN     Performed at [facility]     Performed at [facility]   Culture     Final   Value: NO GROWTH     Performed at [facility]   Report Status PENDING    Incomplete  GRAM STAIN     Status: None   Collection Time    05/24/13  6:04 PM      Result Value Range Status   Specimen Description PERITONEAL ABDOMEN   Final   Special Requests PD PORT   Final   Gram Stain     Final   Value: RARE WBC PRESENT,BOTH PMN AND MONONUCLEAR     NO ORGANISMS SEEN   Report Status 05/24/2013 FINAL   Final     Studies: Ct Head Wo Contrast  05/24/2013   CLINICAL DATA:  Fall  EXAM: CT HEAD WITHOUT CONTRAST  CT CERVICAL SPINE WITHOUT CONTRAST  TECHNIQUE: Multidetector CT imaging of the head and cervical spine was performed following the standard protocol without intravenous contrast. Multiplanar CT image reconstructions of the cervical spine were also generated.  COMPARISON:  None.  FINDINGS: CT HEAD FINDINGS  Mild diffuse cerebral volume loss is seen, consistent with generalized atrophy. Scattered and confluent hypodensity within the periventricular white matter is most compatible with mild chronic microvascular ischemic disease.  There is no acute intracranial hemorrhage or infarct. No mass lesion or midline shift. Gray-white matter differentiation is well maintained. Ventricles are normal in size without evidence of hydrocephalus. CSF containing spaces are within normal limits. No extra-axial fluid collection. Vascular calcifications noted within the carotid siphons.  The calvarium is intact.Orbital soft tissues are within normal limits.  Small calcified retention cyst noted within the left maxillary sinus. Paranasal sinuses are otherwise clear. No mastoid effusion.  Scalp soft tissues are unremarkable.  CT CERVICAL SPINE FINDINGS  The vertebral bodies are normally aligned with preservation of the normal cervical lordosis. Vertebral body heights are preserved. Normal C1-2 articulations are intact. No prevertebral soft tissue swelling. No acute fracture or listhesis.  Moderate degenerative endplate osteophytosis seen anteriorly at C5-6. Degenerative disc bulge is noted at C3-4,  C4-5, and C5-6.  Visualized soft tissues of the neck are within normal limits. Visualized lung apices are clear without evidence of apical pneumothorax.  IMPRESSION: CT BRAIN:  1. No acute intracranial abnormality. 2. Mild atrophy and chronic microvascular ischemic disease.  CT CERVICAL SPINE:  1. No acute fracture or traumatic malalignment within the cervical spine. 2. Moderate degenerative disc disease at C5-6.   Electronically Signed   By: Jeannine Boga M.D.   On: 05/24/2013 14:00   Ct Cervical Spine Wo Contrast  05/24/2013   CLINICAL DATA:  Fall  EXAM: CT HEAD WITHOUT CONTRAST  CT CERVICAL SPINE WITHOUT CONTRAST  TECHNIQUE: Multidetector CT imaging of the head and cervical spine was performed following the standard protocol without intravenous contrast.  Multiplanar CT image reconstructions of the cervical spine were also generated.  COMPARISON:  None.  FINDINGS: CT HEAD FINDINGS  Mild diffuse cerebral volume loss is seen, consistent with generalized atrophy. Scattered and confluent hypodensity within the periventricular white matter is most compatible with mild chronic microvascular ischemic disease.  There is no acute intracranial hemorrhage or infarct. No mass lesion or midline shift. Gray-white matter differentiation is well maintained. Ventricles are normal in size without evidence of hydrocephalus. CSF containing spaces are within normal limits. No extra-axial fluid collection. Vascular calcifications noted within the carotid siphons.  The calvarium is intact.Orbital soft tissues are within normal limits.  Small calcified retention cyst noted within the left maxillary sinus. Paranasal sinuses are otherwise clear. No mastoid effusion.  Scalp soft tissues are unremarkable.  CT CERVICAL SPINE FINDINGS  The vertebral bodies are normally aligned with preservation of the normal cervical lordosis. Vertebral body heights are preserved. Normal C1-2 articulations are intact. No prevertebral soft tissue  swelling. No acute fracture or listhesis.  Moderate degenerative endplate osteophytosis seen anteriorly at C5-6. Degenerative disc bulge is noted at C3-4, C4-5, and C5-6.  Visualized soft tissues of the neck are within normal limits. Visualized lung apices are clear without evidence of apical pneumothorax.  IMPRESSION: CT BRAIN:  1. No acute intracranial abnormality. 2. Mild atrophy and chronic microvascular ischemic disease.  CT CERVICAL SPINE:  1. No acute fracture or traumatic malalignment within the cervical spine. 2. Moderate degenerative disc disease at C5-6.   Electronically Signed   By: Jeannine Boga M.D.   On: 05/24/2013 14:00   Dg Chest Port 1 View  05/24/2013   CLINICAL DATA:  Fever and leukocytosis.  Fall  EXAM: PORTABLE CHEST - 1 VIEW  COMPARISON:  04/16/2013  FINDINGS: The heart size and mediastinal contours are within normal limits for technique. Both lungs are clear. No effusion or pneumothorax. The visualized skeletal structures are unremarkable.  IMPRESSION: No active disease.   Electronically Signed   By: Jorje Guild M.D.   On: 05/24/2013 21:52    Scheduled Meds: . aspirin EC  81 mg Oral Daily  . atorvastatin  10 mg Oral Daily  . azithromycin  500 mg Intravenous Q24H  . calcitRIOL  0.25 mcg Oral Daily  . calcium acetate  2,001 mg Oral TID WC  . cefTRIAXone (ROCEPHIN)  IV  1 g Intravenous Q24H  . cholecalciferol  1,000 Units Oral Daily  . cyclobenzaprine  10 mg Oral QHS  . darbepoetin (ARANESP) injection - DIALYSIS  200 mcg Intravenous Q Tue-HD  . feeding supplement (NEPRO CARB STEADY)  237 mL Oral BID BM  . gabapentin  100 mg Oral TID  . insulin aspart  0-15 Units Subcutaneous TID WC  . insulin aspart  0-5 Units Subcutaneous QHS  . insulin aspart  4 Units Subcutaneous TID WC  . midodrine  10 mg Oral TID WC  . multivitamin  1 tablet Oral QHS  . pantoprazole  40 mg Oral Daily  . [START ON 05/26/2013] sertraline  100 mg Oral Daily  . sodium chloride  3 mL Intravenous  Q12H  . [START ON 05/26/2013] warfarin  2.5 mg Oral Custom  . warfarin  5 mg Oral Custom  . Warfarin - Pharmacist Dosing Inpatient   Does not apply q1800   Continuous Infusions:   Active Problems:   Weakness   Ebonique Hallstrom Psychologist, counselling [phone]. If 7PM-7AM, please contact night-coverage at [email or web address], password [facility] 05/25/2013, 5:39 PM  LOS: 1  day

## 2013-05-25 NOTE — ED Notes (Signed)
Doyle Askew, MD paged.

## 2013-05-25 NOTE — ED Notes (Signed)
Covering Md call back stating that adjusted calium level were close to his baseline and no futher interventions were needed at this time.

## 2013-05-25 NOTE — ED Notes (Signed)
Dr. Clemetine Marker paged

## 2013-05-25 NOTE — ED Notes (Signed)
Nephology at bedside

## 2013-05-25 NOTE — Progress Notes (Signed)
PT Cancellation Note  Patient Details Name: Paul [name] MRN: [id number] DOB: [birth year]   Cancelled Treatment:    Reason Eval/Treat Not Completed: Other (comment)  Just spoke with Dr. Wynetta Emery, who indicated pt will likely be appropriate for PT eval tomorrow  Will proceed with PT eval tomorrow  Thanks,  Roney Marion, [place]    Roney Marion [facility] 05/25/2013, 1:46 PM

## 2013-05-25 NOTE — ED Provider Notes (Signed)
Medical screening examination/treatment/procedure(s) were conducted as a shared visit with non-physician practitioner(s) and myself.  I personally evaluated the patient during the encounter.   Please see my separate note.     [name], MD 05/25/13 1750

## 2013-05-26 DIAGNOSIS — I959 Hypotension, unspecified: Secondary | ICD-10-CM

## 2013-05-26 LAB — CBC
HCT: 22.8 % — ABNORMAL LOW (ref 39.0–52.0)
HEMOGLOBIN: 7.6 g/dL — AB (ref 13.0–17.0)
MCH: 30.9 pg (ref 26.0–34.0)
MCHC: 33.3 g/dL (ref 30.0–36.0)
MCV: 92.7 fL (ref 78.0–100.0)
Platelets: 263 10*3/uL (ref 150–400)
RBC: 2.46 MIL/uL — ABNORMAL LOW (ref 4.22–5.81)
RDW: 14.9 % (ref 11.5–15.5)
WBC: 12.1 10*3/uL — AB (ref 4.0–10.5)

## 2013-05-26 LAB — GLUCOSE, CAPILLARY
GLUCOSE-CAPILLARY: 130 mg/dL — AB (ref 70–99)
Glucose-Capillary: 136 mg/dL — ABNORMAL HIGH (ref 70–99)
Glucose-Capillary: 192 mg/dL — ABNORMAL HIGH (ref 70–99)
Glucose-Capillary: 138 mg/dL — ABNORMAL HIGH (ref 70–99)
Glucose-Capillary: 84 mg/dL (ref 70–99)
Glucose-Capillary: 152 mg/dL — ABNORMAL HIGH (ref 70–99)

## 2013-05-26 LAB — DIFFERENTIAL
BASOS ABS: 0 10*3/uL (ref 0.0–0.1)
BASOS PCT: 0 % (ref 0–1)
Eosinophils Absolute: 0.1 10*3/uL (ref 0.0–0.7)
Eosinophils Relative: 1 % (ref 0–5)
LYMPHS PCT: 9 % — AB (ref 12–46)
Lymphs Abs: 1.1 10*3/uL (ref 0.7–4.0)
Monocytes Absolute: 1 10*3/uL (ref 0.1–1.0)
Monocytes Relative: 8 % (ref 3–12)
NEUTROS PCT: 82 % — AB (ref 43–77)
Neutro Abs: 10 10*3/uL — ABNORMAL HIGH (ref 1.7–7.7)

## 2013-05-26 LAB — RENAL FUNCTION PANEL
Albumin: 1.7 g/dL — ABNORMAL LOW (ref 3.5–5.2)
BUN: 51 mg/dL — ABNORMAL HIGH (ref 6–23)
CALCIUM: 6.9 mg/dL — AB (ref 8.4–10.5)
CHLORIDE: 99 meq/L (ref 96–112)
CO2: 26 meq/L (ref 19–32)
Creatinine, Ser: 5.67 mg/dL — ABNORMAL HIGH (ref 0.50–1.35)
GFR, EST AFRICAN AMERICAN: 11 mL/min — AB (ref >90)
GFR, EST NON AFRICAN AMERICAN: 10 mL/min — AB (ref >90)
GLUCOSE: 159 mg/dL — AB (ref 70–99)
Phosphorus: 4.7 mg/dL — ABNORMAL HIGH (ref 2.3–4.6)
Potassium: 3.8 mEq/L (ref 3.7–5.3)
SODIUM: 141 meq/L (ref 137–147)

## 2013-05-26 LAB — PROTIME-INR
INR: 3.02 — ABNORMAL HIGH (ref 0.00–1.49)
Prothrombin Time: 30.2 seconds — ABNORMAL HIGH (ref 11.6–15.2)

## 2013-05-26 MED ORDER — BOOST / RESOURCE BREEZE PO LIQD
1.0000 | Freq: Three times a day (TID) | ORAL | Status: DC
  Administered 2013-05-26 – 2013-05-31 (×14): 1 via ORAL

## 2013-05-26 MED ORDER — DOXERCALCIFEROL 4 MCG/2ML IV SOLN: 4.0000 ug | INTRAVENOUS | Status: DC

## 2013-05-26 MED ORDER — DOXERCALCIFEROL 4 MCG/2ML IV SOLN
1.0000 ug | INTRAVENOUS | Status: DC
  Administered 2013-05-27: 1 ug via INTRAVENOUS
  Filled 2013-05-26 (×2): qty 2

## 2013-05-26 NOTE — Progress Notes (Signed)
I have seen and examined this patient and agree with plan as outlined by Ernest Haber, PA-C.  Feels better.  Marked improvement of azotemia after one HD session.  Cont with TTS for now and await outpt HD placement. Makye Radle A,MD 05/26/2013 12:12 PM

## 2013-05-26 NOTE — Progress Notes (Addendum)
[facility] PROGRESS NOTE  Paul [name] [id number] DOB: Dec 20, 1955 DOA: 05/24/2013 PCP: [name], MD  Assessment/Plan: 58 year old male has a history of diabetes mellitus with complications of neuropathy, hypokalemia, end stage chronic kidney disease on peritoneal dialysis at home daily, who presented to [facility] ED with main concern of several weeks duration of progressively worsening generalized weakness, one episode of fall earlier today prior to this admission, while he was trying to get out of the bed. He explains he fell down and he hit his head but denies LOC. Pt explains he has been having generalized headaches since the fall, throbbing and constant, 3/10 in severity and non radiating no specific alleviating or aggravating factors, no visual changes. Pt currently denies chest pain or shortness of breath, no specific abdominal or urinary concerns, no similar events in the past. He also denies fevers, chills.  In ED, pt hemodynamically stable, electrolyte panel with several metabolic derangement including low K 3.4, sodium 135, Ca 10.2. TRH asked to admit for further evaluation . CT head  negative for acute intracranial abnormalities.   Assessment and Plan:  Generalized weakness with subsequent fall  -  review of records indicate similar events in the past due to low BP  - BP on this admission initially 90/60 with slight improvement one NS bolus to 100/60  - monitor on  Telemetry. -seen by renal and recommends placing him on HD instead of PD for better volume monitoring and until current issue resolve.  -added midodrine - seen by PT . recommends CIR. Consult placed.   ESRD  - on peritoneal dialysis  -renal consulted  Diabetes mellitus  - A1C 6.7 - continue SSI   Hypokalemia  - stable  Leukocytosis  - no sign of infection. Will d/c abx  Anemia of chronic disease  - baseline Hg appears to be 8-9 . Likely ACD. Slight drop noted this am. Monitor tomorrow   A-fib  Rate  controlled on amiodarone - continue Coumadin per pharmacy   Code Status: Full  Family Communication: Pt at bedside  Disposition Plan: CIR     HPI/Subjective: Feels better but still weak and BP drops on standing  Objective: Filed Vitals:   05/26/13 1325  BP: 101/60  Pulse: 80  Temp: 98 F (36.7 C)  Resp: 18    Intake/Output Summary (Last 24 hours) at 05/26/13 1418 Last data filed at 05/26/13 1300  Gross per 24 hour  Intake    720 ml  Output      1 ml  Net    719 ml   Filed Weights   05/24/13 2148 05/25/13 2042  Weight: 86 kg (189 lb 9.5 oz) 86.183 kg (190 lb)    Exam:   General:  Middle aged thin built male in NAD   HEENT: no pallor, moist mucosa   chest: clear b/l, no added sounds   CVS: S1&S2 irregular, no MRG  Abd: soft, NT, ND, BS+  Ext: warm, no edema   CNS: AAAOX3  Data Reviewed: Basic Metabolic Panel:  Recent Labs Lab 05/24/13 1157 05/25/13 0314 05/26/13 0422  NA 135* 134* 141  K 3.4* 3.6* 3.8  CL 90* 88* 99  CO2 18* 19 26  GLUCOSE 314* 263* 159*  BUN 102* 118* 51*  CREATININE 10.02* 10.65* 5.67*  CALCIUM 6.6* 5.9* 6.9*  PHOS  --   --  4.7*   Liver Function Tests:  Recent Labs Lab 05/24/13 1157 05/26/13 0422  AST 26  --   ALT 21  --  ALKPHOS 136*  --   BILITOT 0.4  --   PROT 5.6*  --   ALBUMIN 1.7* 1.7*   No results found for this basename: LIPASE, AMYLASE,  in the last 168 hours No results found for this basename: AMMONIA,  in the last 168 hours CBC:  Recent Labs Lab 05/24/13 1157 05/25/13 0314 05/26/13 0422  WBC 17.7* 15.4* 12.1*  NEUTROABS 15.8*  --  10.0*  HGB 9.4* 8.6* 7.6*  HCT 27.5* 25.1* 22.8*  MCV 91.1 91.3 92.7  PLT 269 278 263   Cardiac Enzymes: No results found for this basename: CKTOTAL, CKMB, CKMBINDEX, TROPONINI,  in the last 168 hours BNP (last 3 results)  Recent Labs  04/02/13 1217  PROBNP 12548.0*   CBG:  Recent Labs Lab 05/25/13 1309 05/25/13 2049 05/26/13 0242 05/26/13 0730  05/26/13 1205  GLUCAP 124* 136* 192* 138* 130*    Recent Results (from the past 240 hour(s))  BODY FLUID CULTURE     Status: None   Collection Time    05/24/13  6:04 PM      Result Value Range Status   Specimen Description PERITONEAL ABDOMEN   Final   Special Requests FROM PD PORT   Final   Gram Stain     Final   Value: RARE WBC PRESENT,BOTH PMN AND MONONUCLEAR     NO ORGANISMS SEEN     Performed at [facility]     Performed at [facility]   Culture     Final   Value: NO GROWTH 1 DAY     Performed at [facility]   Report Status PENDING   Incomplete  GRAM STAIN     Status: None   Collection Time    05/24/13  6:04 PM      Result Value Range Status   Specimen Description PERITONEAL ABDOMEN   Final   Special Requests PD PORT   Final   Gram Stain     Final   Value: RARE WBC PRESENT,BOTH PMN AND MONONUCLEAR     NO ORGANISMS SEEN   Report Status 05/24/2013 FINAL   Final     Studies: Dg Chest Port 1 View  05/24/2013   CLINICAL DATA:  Fever and leukocytosis.  Fall  EXAM: PORTABLE CHEST - 1 VIEW  COMPARISON:  04/16/2013  FINDINGS: The heart size and mediastinal contours are within normal limits for technique. Both lungs are clear. No effusion or pneumothorax. The visualized skeletal structures are unremarkable.  IMPRESSION: No active disease.   Electronically Signed   By: Jorje Guild M.D.   On: 05/24/2013 21:52    Scheduled Meds: . aspirin EC  81 mg Oral Daily  . atorvastatin  10 mg Oral Daily  . azithromycin  500 mg Intravenous Q24H  . calcium acetate  2,001 mg Oral TID WC  . cefTRIAXone (ROCEPHIN)  IV  1 g Intravenous Q24H  . cholecalciferol  1,000 Units Oral Daily  . cyclobenzaprine  10 mg Oral QHS  . darbepoetin (ARANESP) injection - DIALYSIS  200 mcg Intravenous Q Tue-HD  . [START ON 05/27/2013] doxercalciferol  4 mcg Intravenous Q T,Th,Sa-HD  . feeding supplement (RESOURCE BREEZE)  1 Container Oral TID WC  . gabapentin  100 mg Oral TID  .  insulin aspart  0-15 Units Subcutaneous TID WC  . insulin aspart  0-5 Units Subcutaneous QHS  . insulin aspart  4 Units Subcutaneous TID WC  . insulin glargine  15 Units Subcutaneous QHS  .  midodrine  10 mg Oral TID WC  . multivitamin  1 tablet Oral QHS  . pantoprazole  40 mg Oral Daily  . sertraline  100 mg Oral Daily  . sodium chloride  3 mL Intravenous Q12H  . warfarin  2.5 mg Oral Custom  . warfarin  5 mg Oral Custom  . Warfarin - Pharmacist Dosing Inpatient   Does not apply q1800   Continuous Infusions:     Time spent: [place], [place] [facility] Pager [phone]. If 7PM-7AM, please contact night-coverage at [email or web address], password [facility] 05/26/2013, 2:18 PM  LOS: 2 days

## 2013-05-26 NOTE — Evaluation (Signed)
Occupational Therapy Evaluation Patient Details Name: Paul [name] MRN: [id number] DOB: 02-01-1956 Today's Date: 05/26/2013 Time: [id number] OT Time Calculation (min): 13 min  OT Assessment / Plan / Recommendation History of present illness Patient is a 58 y/o male admitted with weakness and fall.  PMH positive for diabetes mellitus with complications of neuropathy, hypokalemia, end stage chronic kidney disease on peritoneal dialysis at home daily.   Clinical Impression   Pt demos decline in function with ADLs and ADL mobility safety and would benefit from acute OT services to address impairments to increase level of function and safety    OT Assessment  Patient needs continued OT Services    Follow Up Recommendations  CIR;Supervision/Assistance - 24 hour    Barriers to Discharge   none  Equipment Recommendations  None recommended by OT;Other (comment) (TBD at next level of care)    Recommendations for Other Services Rehab consult  Frequency  Min 2X/week    Precautions / Restrictions Precautions Precautions: Fall Precaution Comments: reports 5 falls in 6 months (most in past couple of weeks) Restrictions Weight Bearing Restrictions: No   Pertinent Vitals/Pain 3/10 B LEs at rest, 6/10 during mobility    ADL  Grooming: Performed;Wash/dry hands;Wash/dry face;Min guard Where Assessed - Grooming: Supported standing Upper Body Bathing: Simulated;Set up;Supervision/safety Where Assessed - Upper Body Bathing: Unsupported sitting Lower Body Bathing: Simulated;Minimal assistance Where Assessed - Lower Body Bathing: Unsupported sitting;Supported sit to stand Upper Body Dressing: Performed;Supervision/safety;Set up Lower Body Dressing: Minimal assistance;Moderate assistance;Performed Where Assessed - Lower Body Dressing: Unsupported sitting;Supported sit to stand Toilet Transfer: Minimal assistance;Simulated Toilet Transfer Method: Sit to stand;Stand pivot Toileting - Clothing  Manipulation and Hygiene: Performed;Min guard Where Assessed - Camera operator Manipulation and Hygiene: Standing Tub/Shower Transfer Method: Not assessed Transfers/Ambulation Related to ADLs: pt requested to return to bed after toilet transfer simulated ADL Comments: min - mod A due to fatigue and dizziness, BP can drop    OT Diagnosis: Generalized weakness  OT Problem List: Impaired balance (sitting and/or standing);Decreased activity tolerance;Decreased knowledge of use of DME or AE OT Treatment Interventions: Self-care/ADL training;Therapeutic exercise;Neuromuscular education;Patient/family education;Balance training;Therapeutic activities;DME and/or AE instruction   OT Goals(Current goals can be found in the care plan section) Acute Rehab OT Goals Patient Stated Goal: To get strong enough to manage independent OT Goal Formulation: With patient Time For Goal Achievement: 06/02/13 Potential to Achieve Goals: Good ADL Goals Pt Will Perform Grooming: with supervision;with set-up;standing Pt Will Perform Lower Body Bathing: with min assist;with min guard assist;sit to/from stand Pt Will Perform Lower Body Dressing: with min assist;sitting/lateral leans;sit to/from stand Pt Will Transfer to Toilet: with min guard assist;with supervision;ambulating;regular height toilet;grab bars Pt Will Perform Toileting - Clothing Manipulation and hygiene: with supervision;sit to/from stand Pt Will Perform Tub/Shower Transfer: with min assist;with min guard assist  Visit Information  Last OT Received On: 05/26/13 Assistance Needed: +1 History of Present Illness: Patient is a 58 y/o male admitted with weakness and fall.  PMH positive for diabetes mellitus with complications of neuropathy, hypokalemia, end stage chronic kidney disease on peritoneal dialysis at home daily.       Prior [place] expects to be discharged to:: Private residence Living Arrangements:  Spouse/significant other Available Help at Discharge: Family;Available 24 hours/day Type of Home: House Home Access: Stairs to enter [employer] of Steps: 3 Entrance Stairs-Rails: Right;Left Home Layout: Two level;Able to live on main level with bedroom/bathroom;Full bath on main level Alternate Level  Stairs-Number of Steps: doesnt go upstairs Home Equipment: Kasandra Knudsen - single point;Shower seat;Walker - 2 wheels Prior Function Level of Independence: Needs assistance Gait / Transfers Assistance Needed: someone stands next to him on stairs, was using cane up unitl past 2-3 weeks started using walker and fell several times due to legs giving away Communication Communication: No difficulties Dominant Hand: Right         Vision/Perception Vision - History Patient Visual Report: No change from baseline Perception Perception: Within Functional Limits   Cognition       Extremity/Trunk Assessment Upper Extremity Assessment Upper Extremity Assessment: Overall WFL for tasks assessed;Generalized weakness Lower Extremity Assessment Lower Extremity Assessment: Defer to PT evaluation Cervical / Trunk Assessment Cervical / Trunk Assessment: Normal     Mobility Bed Mobility Overal bed mobility: Needs Assistance Bed Mobility: Supine to Sit Supine to sit: Min guard General bed mobility comments: sup - sit not tested, pt in recliner upon entering room Transfers Overall transfer level: Needs assistance Equipment used: Rolling walker (2 wheeled) Transfers: Sit to/from [employer] Sit to Stand: Min assist Stand pivot transfers: Min assist General transfer comment: pt requested to get back into bed after simulated toilet transfer due to fatigue and dizziness          Balance Balance Overall balance assessment: History of Falls;Needs assistance   End of Session OT - End of Session Equipment Utilized During Treatment: Gait belt;Rolling walker Activity Tolerance:  Patient limited by fatigue Patient left: in bed;with call bell/phone within reach  GO     Britt Bottom 05/26/2013, 1:47 PM

## 2013-05-26 NOTE — Evaluation (Signed)
Physical Therapy Evaluation Patient Details Name: Paul [name] MRN: [id number] DOB: [birth year] Today's Date: 05/26/2013 Time: [id number] PT Time Calculation (min): 20 min  PT Assessment / Plan / Recommendation History of Present Illness  Patient is a 58 y/o male admitted with weakness and fall.  PMH positive for diabetes mellitus with complications of neuropathy, hypokalemia, end stage chronic kidney disease on peritoneal dialysis at home daily.  Clinical Impression  Patient presents with decreased independence with mobility due to deficits listed below.  He will benefit from skilled PT in the acute setting to allow return home with wife following CIR stay.    PT Assessment  Patient needs continued PT services    Follow Up Recommendations  CIR;Supervision/Assistance - 24 hour    Does the patient have the potential to tolerate intense rehabilitation    Yes  Barriers to Discharge Decreased caregiver support states wife cannot do lot of lifting    Equipment Recommendations  Rolling walker with 5" wheels    Recommendations for Other Services Rehab consult   Frequency Min 3X/week    Precautions / Restrictions Precautions Precautions: Fall Precaution Comments: reports 5 falls in 6 months (most in past couple of weeks)   Pertinent Vitals/Pain Min c/o pain in feet, but feels leg pain improved since admission      Mobility    Bed mobility: Needs Assistance Min assist with pt using bed rails; assist to lift trunk upright Transfers: Needs Assistance Sit to/from Stand: Mod assist from elevated surface with significant LE weakness Stand Pivot: bed to chair with min assist using rolling walker (pt c/o lightheaded and BP 72/41 standing, reclined in chair 92/42, RN aware and pt asymptomatic)   Exercises General Exercises - Lower Extremity Quad Sets: AROM;Both;5 reps   PT Diagnosis: Generalized weakness;Abnormality of gait  PT Problem List: Decreased strength;Decreased activity  tolerance;Decreased balance;Decreased mobility;Decreased safety awareness;Decreased knowledge of use of DME PT Treatment Interventions: DME instruction;Balance training;Gait training;Stair training;Functional mobility training;Patient/family education;Therapeutic activities;Therapeutic exercise     PT Goals(Current goals can be found in the care plan section) Acute Rehab PT Goals Patient Stated Goal: To get strong enough to manage independent PT Goal Formulation: With patient Time For Goal Achievement: 06/09/13 Potential to Achieve Goals: Good  Visit Information  Last PT Received On: 05/26/13 Assistance Needed: +1 History of Present Illness: Patient is a 58 y/o male admitted with weakness and fall.  PMH positive for diabetes mellitus with complications of neuropathy, hypokalemia, end stage chronic kidney disease on peritoneal dialysis at home daily.       Prior [place] expects to be discharged to:: Private residence Living Arrangements: Spouse/significant other Available Help at Discharge: Family;Available 24 hours/day Type of Home: House Home Access: Stairs to enter [employer] of Steps: 3 Entrance Stairs-Rails: Right;Left Home Layout: Two level;Able to live on main level with bedroom/bathroom;Full bath on main level Alternate Level Stairs-Number of Steps: doesnt go upstairs Home Equipment: Walker - 4 wheels;Cane - single point;Shower seat Additional Comments: walk-in shower Prior Function Level of Independence: Needs assistance Gait / Transfers Assistance Needed: someone stands next to him on stairs, was using cane up unitl past 2-3 weeks started using walker and fell several times due to legs giving away Communication Communication: No difficulties Dominant Hand: Right    Cognition  Cognition Arousal/Alertness: Awake/alert Behavior During Therapy: WFL for tasks assessed/performed Overall Cognitive Status: Within Functional Limits  for tasks assessed    Extremity/Trunk Assessment Upper Extremity Assessment Upper Extremity Assessment: Defer  to OT evaluation Lower Extremity Assessment Lower Extremity Assessment: RLE deficits/detail;LLE deficits/detail RLE Deficits / Details: AROM WFL strength 3+/5 LLE Deficits / Details: AROM WFL strength 3+/5   Balance    End of Session PT - End of Session Activity Tolerance: Patient limited by fatigue;Other (comment) (limited due to hypotension) Patient left: in chair;with call bell/phone within reach Nurse Communication: Mobility status;Precautions (hypotension)  GP     [facility] 05/26/2013, 9:40 AM Magda Kiel, [place] 05/26/2013

## 2013-05-26 NOTE — Progress Notes (Signed)
Subjective:  Was in bathroom, and feels better with hd yesterday/ clip process started Objective Vital signs in last 24 hours: Filed Vitals:   05/25/13 2042 05/26/13 0455 05/26/13 0929 05/26/13 1057  BP: 130/68 126/55 92/49 117/57  Pulse: 101 83  76  Temp: 98.9 F (37.2 C) 98.8 F (37.1 C)  98.6 F (37 C)  TempSrc: Oral Oral  Oral  Resp: 15 16  16   Height:      Weight: 86.183 kg (190 lb)     SpO2: 96% 97%  96%   Weight change: 0.183 kg (6.5 oz)  Labs: Basic Metabolic Panel:  Recent Labs Lab 05/24/13 1157 05/25/13 0314 05/26/13 0422  NA 135* 134* 141  K 3.4* 3.6* 3.8  CL 90* 88* 99  CO2 18* 19 26  GLUCOSE 314* 263* 159*  BUN 102* 118* 51*  CREATININE 10.02* 10.65* 5.67*  CALCIUM 6.6* 5.9* 6.9*  PHOS  --   --  4.7*   Liver Function Tests:  Recent Labs Lab 05/24/13 1157 05/26/13 0422  AST 26  --   ALT 21  --   ALKPHOS 136*  --   BILITOT 0.4  --   PROT 5.6*  --   ALBUMIN 1.7* 1.7*   CBC:  Recent Labs Lab 05/24/13 1157 05/25/13 0314 05/26/13 0422  WBC 17.7* 15.4* 12.1*  NEUTROABS 15.8*  --  10.0*  HGB 9.4* 8.6* 7.6*  HCT 27.5* 25.1* 22.8*  MCV 91.1 91.3 92.7  PLT 269 278 263    Recent Labs Lab 05/25/13 0902 05/25/13 1309 05/25/13 2049 05/26/13 0242 05/26/13 0730  GLUCAP 187* 124* 136* 192* 138*   Medications:   . aspirin EC  81 mg Oral Daily  . atorvastatin  10 mg Oral Daily  . azithromycin  500 mg Intravenous Q24H  . calcium acetate  2,001 mg Oral TID WC  . cefTRIAXone (ROCEPHIN)  IV  1 g Intravenous Q24H  . cholecalciferol  1,000 Units Oral Daily  . cyclobenzaprine  10 mg Oral QHS  . darbepoetin (ARANESP) injection - DIALYSIS  200 mcg Intravenous Q Tue-HD  . [START ON 05/27/2013] doxercalciferol  4 mcg Intravenous Q T,Th,Sa-HD  . feeding supplement (NEPRO CARB STEADY)  237 mL Oral BID BM  . gabapentin  100 mg Oral TID  . insulin aspart  0-15 Units Subcutaneous TID WC  . insulin aspart  0-5 Units Subcutaneous QHS  . insulin aspart  4  Units Subcutaneous TID WC  . insulin glargine  15 Units Subcutaneous QHS  . midodrine  10 mg Oral TID WC  . multivitamin  1 tablet Oral QHS  . pantoprazole  40 mg Oral Daily  . sertraline  100 mg Oral Daily  . sodium chloride  3 mL Intravenous Q12H  . warfarin  2.5 mg Oral Custom  . warfarin  5 mg Oral Custom  . Warfarin - Pharmacist Dosing Inpatient   Does not apply q1800    Physical Exam: General: Alert nad, somewhat flat affect Heart: RRR, no rub or mur Lungs: CTA  Abdomen: Soft NT ND , pD cath in place Extremities: Dialysis Access: trace pedal edema/ Pos Bruit  L UA AVF    PD orders were= Dialyzes at home on PD since 02/2013 (prior to that home hemo and brief period of in center hemo)  Primary Nephrologist [place]  EDW 85  PD = 6 exchanges/night of 3 liters; last fill 2500, pause 2500; currently using all 2.5% dialysate with soft BP's but peripheral edema  Accesses = PD cath and RUE AVF  EPO = 21,000 units twice/week SQ  Problem/Plan:   1.ESRD - failure to thrive on PD;  Feels better with HD/  Noted bun 118 to 51 and cr10.6 to 5.6/ obtain op spot now  Prob. [facility]/ next hd in am   2. Generalized weakness/falls - multietiologies= uremia and  perhaps in part due to soft BP's - has been recent problem - and low 100's despite presence of edema. (low alb contributes to that); add midodrine for BP (TSH and cortisol both normal as outpt and 2D echo 03/2013 normal EF). Ask PT to see   3. DM - per primary   4 .Leukocytosis - Somewhat improved today to 12.1; on empiric rocephin and azithro; f/u CBC pre hd  5. Anemia - no overt bleeding; change SQ EPO to IV with HD (wll have to use ARANESP in hosp) 6. MBD- change po vit to iv vit d on hd  / phos 4.7 and corrected ca 9.3 7. Malnutrition - supplements (Nepro BID) wants to try B[place]ze/ ? Uremia playing some role also  8.AF - RRR today, rate controlled; on AMIO and coumadin   9.Depression - noted by  Dr. Lorrene Reid  has been on zoloft  50/day - affect very flat/. Yesterday/increased Zoloft  to 100/day; ? If would benefit from neuro consult to r/o medical issues like Parkinson's or dementia   Ernest Haber, PA-C [place] [phone] 05/26/2013,12:06 PM  LOS: 2 days

## 2013-05-26 NOTE — Consult Note (Signed)
Physical Medicine and Rehabilitation Consult Reason for Consult: Deconditioning/hypotension Referring Physician: [facility]   HPI: Paul [name] is a 58 y.o. right-handed male with history of atrial fibrillation on chronic Coumadin , end-stage renal disease secondary to diabetes mellitus on peritoneal dialysis at home. Patient was using a cane and walker prior to admission. Admitted 05/24/2013 with progressive weakness over several weeks as well as episode of fall when trying to get bed. Patient denies any head trauma although he had been having generalized headache since the fall. Cranial CT scan negative for any acute abnormalities. CT cervical spine with no fracture or malalignment. Blood pressure on admission 90/60 he did receive fluid bolus. Creatinine on admission of 10.02. Followup renal services with hemodialysis ongoing. Bouts of hypokalemia with supplement added. Physical and occupational therapy evaluations completed 05/26/2013 secondary to weakness deconditioning with recommendations for physical medicine rehabilitation consult to consider inpatient rehabilitation services   Review of Systems  Constitutional: Positive for malaise/fatigue.  Respiratory: Positive for shortness of breath.   Cardiovascular: Positive for palpitations.  Gastrointestinal:       GERD  Neurological: Positive for dizziness, tingling and weakness.  Psychiatric/Behavioral: Positive for depression.  All other systems reviewed and are negative.   Past Medical History  Diagnosis Date  . Diabetic neuropathy   . Cancer     History of Duodenal Cancer  . Chronic kidney disease     Pt is not eligible for transplant per [facility]  . Diabetes mellitus     Type 2 iddm x 11 yrs  . Anemia     esrd  . Hypertension     meds d/c 'd  2 wks ago for postural hypotension  . Fatigue      tires easily x 2 months  . Chronic nausea     with  morning vomiting; better off  HTN meds  . Intermittent tremor   . Closed left arm  fracture 1967  . Heart murmur   . Corneal abrasion, left   . Shortness of breath     exertional  . GERD (gastroesophageal reflux disease)   . History of blood transfusion   . Lack of coordination   . Muscle weakness (generalized)   . Glomerulosclerosis, diabetic   . Depression   . A-fib    Past Surgical History  Procedure Laterality Date  . Whipple procedure  2002    duodenal ca  . Bone marrow biopsy  2012  . Renal biopsy, percutaneous  2012  . Tonsillectomy    . Portacath placement  2002  . Biliary diversion, external  2002  . Other surgical history      Cyst removed from back  . Av fistula placement  06/07/2011    Procedure: ARTERIOVENOUS (AV) FISTULA CREATION;  Surgeon: Angelia Mould, MD;  Location: [place];  Service: Vascular;  Laterality: Left;  . Ligation of arteriovenous  fistula  05/22/2012    Procedure: LIGATION OF ARTERIOVENOUS  FISTULA;  Surgeon: Mal Misty, MD;  Location: [place];  Service: Vascular;  Laterality: Left;  Left brachio-cephalic fistula  . Insertion of dialysis catheter  05/22/2012    Procedure: INSERTION OF DIALYSIS CATHETER;  Surgeon: Mal Misty, MD;  Location: [place];  Service: Vascular;  Laterality: N/A;  right internal jugular vein  . Av fistula placement  06/18/2012    Procedure: ARTERIOVENOUS (AV) FISTULA CREATION;  Surgeon: Mal Misty, MD;  Location: [place];  Service: Vascular;  Laterality: Right;  [place]  . Colonoscopy  Hx: of  . Capd insertion N/A 01/14/2013    Procedure: LAPAROSCOPIC INSERTION CONTINUOUS AMBULATORY PERITONEAL DIALYSIS  (CAPD) CATHETER;  Surgeon: Adin Hector, MD;  Location: [place];  Service: General;  Laterality: N/A;   Family History  Problem Relation Age of Onset  . Cancer Mother   . Cancer Father     prostate cancer  . Heart disease Maternal Grandfather   . Stroke Paternal Grandmother    Social History:  reports that he has never smoked. He has never used smokeless tobacco. He reports that he does not  drink alcohol or use illicit drugs. Allergies:  Allergies  Allergen Reactions  . Metformin And Related Other (See Comments)    Kidney problems   Medications Prior to Admission  Medication Sig Dispense Refill  . aspirin EC 81 MG tablet Take 81 mg by mouth daily.      [name] atorvastatin (LIPITOR) 10 MG tablet Take 10 mg by mouth daily.       . calcitRIOL (ROCALTROL) 0.25 MCG capsule Take 0.25 mcg by mouth daily.       . calcium acetate (PHOSLO) 667 MG capsule Take 667 mg by mouth 3 (three) times daily with meals.       . cholecalciferol (VITAMIN D) 1000 UNITS tablet Take 1,000 Units by mouth daily.      . cyclobenzaprine (FLEXERIL) 10 MG tablet Take 10 mg by mouth at bedtime.      [name] epoetin alfa (EPOGEN,PROCRIT) 75916 UNIT/ML injection Inject 21,000 Units into the skin See admin instructions. 21,000 units every Monday and Friday      . gabapentin (NEURONTIN) 100 MG capsule Take 1 capsule (100 mg total) by mouth 3 (three) times daily.  60 capsule  0  . insulin glargine (LANTUS) 100 UNIT/ML injection Inject 20-27 Units into the skin every morning. Per sliding scale.  EX) if CBG is 120 then take 20 units, if CBG is 200 then take 27 units      . metoCLOPramide (REGLAN) 10 MG tablet Take 1 tablet (10 mg total) by mouth every 6 (six) hours as needed for nausea (nausea/headache).  6 tablet  0  . omeprazole (PRILOSEC) 40 MG capsule Take 40 mg by mouth daily.      . ondansetron (ZOFRAN-ODT) 8 MG disintegrating tablet Take 8 mg by mouth every 8 (eight) hours as needed for nausea or vomiting.      . sertraline (ZOLOFT) 50 MG tablet Take 50 mg by mouth daily.       [name] warfarin (COUMADIN) 5 MG tablet Take 2.5-5 mg by mouth See admin instructions. Take 5mg  every day except on Wednesday and Sundays take 2.5mg         Home: Home Living Family/patient expects to be discharged to:: Private residence Living Arrangements: Spouse/significant other Available Help at Discharge: Family;Available 24 hours/day Type of  Home: House Home Access: Stairs to enter [employer] of Steps: 3 Entrance Stairs-Rails: Right;Left Home Layout: Two level;Able to live on main level with bedroom/bathroom;Full bath on main level Alternate Level Stairs-Number of Steps: doesnt go upstairs Home Equipment: Cane - single point;Shower seat;Walker - 2 wheels Additional Comments: walk-in shower  Functional History:   Functional Status:  Mobility:          ADL: ADL Grooming: Performed;Wash/dry hands;Wash/dry face;Min guard Where Assessed - Grooming: Supported standing Upper Body Bathing: Simulated;Set up;Supervision/safety Where Assessed - Upper Body Bathing: Unsupported sitting Lower Body Bathing: Simulated;Minimal assistance Where Assessed - Lower Body Bathing: Unsupported sitting;Supported sit to  stand Upper Body Dressing: Performed;Supervision/safety;Set up Lower Body Dressing: Minimal assistance;Moderate assistance;Performed Where Assessed - Lower Body Dressing: Unsupported sitting;Supported sit to stand Toilet Transfer: Minimal assistance;Simulated Toilet Transfer Method: Sit to stand;Stand pivot Tub/Shower Transfer Method: Not assessed Transfers/Ambulation Related to ADLs: pt requested to return to bed after toilet transfer simulated ADL Comments: min - mod A due to fatigue and dizziness, BP can drop  Cognition: Cognition Overall Cognitive Status: Within Functional Limits for tasks assessed Orientation Level: Oriented X4 Cognition Arousal/Alertness: Awake/alert Behavior During Therapy: WFL for tasks assessed/performed Overall Cognitive Status: Within Functional Limits for tasks assessed  Blood pressure 101/60, pulse 80, temperature 98 F (36.7 C), temperature source Oral, resp. rate 18, height 6' 2.02" (1.88 m), weight 86.183 kg (190 lb), SpO2 98.00%. Physical Exam  Vitals reviewed. Constitutional: He is oriented to person, place, and time.  HENT:  Head: Normocephalic.  Eyes: EOM are normal.   Neck: Normal range of motion. Neck supple. No thyromegaly present.  Cardiovascular:  Cardiac rate controlled  Respiratory: Effort normal and breath sounds normal. No respiratory distress.  GI: Soft. Bowel sounds are normal. He exhibits no distension.  Musculoskeletal:  both ankles tender with palpation and PROM. Bruising note around either ankle as well  Neurological: He is alert and oriented to person, place, and time. He displays normal reflexes. No cranial nerve deficit.  Followed commands. Flat affect. Stocking glove sensory deficits from mid calf down. No sensory loss in hands. UE's grossly 4+. LE's 2+ to 3- with HF, 3 KE and 4- at ankles. No hypertonicity  Skin: Skin is warm and dry.    Results for orders placed during the hospital encounter of 05/24/13 (from the past 24 hour(s))  HEPATITIS B SURFACE ANTIGEN     Status: None   Collection Time    05/25/13  3:11 PM      Result Value Range   Hepatitis B Surface Ag NEGATIVE  NEGATIVE  HEPATITIS B CORE ANTIBODY, TOTAL     Status: None   Collection Time    05/25/13  3:11 PM      Result Value Range   Hep B Core Total Ab NON REACTIVE  NON REACTIVE  HEPATITIS B SURFACE ANTIBODY     Status: Abnormal   Collection Time    05/25/13  3:11 PM      Result Value Range   Hep B S Ab POSITIVE (*) NEGATIVE  GLUCOSE, CAPILLARY     Status: Abnormal   Collection Time    05/25/13  8:49 PM      Result Value Range   Glucose-Capillary 136 (*) 70 - 99 mg/dL  GLUCOSE, CAPILLARY     Status: Abnormal   Collection Time    05/26/13  2:42 AM      Result Value Range   Glucose-Capillary 192 (*) 70 - 99 mg/dL  PROTIME-INR     Status: Abnormal   Collection Time    05/26/13  4:22 AM      Result Value Range   Prothrombin Time 30.2 (*) 11.6 - 15.2 seconds   INR 3.02 (*) 0.00 - 1.49  RENAL FUNCTION PANEL     Status: Abnormal   Collection Time    05/26/13  4:22 AM      Result Value Range   Sodium 141  137 - 147 mEq/L   Potassium 3.8  3.7 - 5.3 mEq/L    Chloride 99  96 - 112 mEq/L   CO2 26  19 - 32 mEq/L  Glucose, Bld 159 (*) 70 - 99 mg/dL   BUN 51 (*) 6 - 23 mg/dL   Creatinine, Ser 5.67 (*) 0.50 - 1.35 mg/dL   Calcium 6.9 (*) 8.4 - 10.5 mg/dL   Phosphorus 4.7 (*) 2.3 - 4.6 mg/dL   Albumin 1.7 (*) 3.5 - 5.2 g/dL   GFR calc non Af Amer 10 (*) >90 mL/min   GFR calc Af Amer 11 (*) >90 mL/min  CBC     Status: Abnormal   Collection Time    05/26/13  4:22 AM      Result Value Range   WBC 12.1 (*) 4.0 - 10.5 K/uL   RBC 2.46 (*) 4.22 - 5.81 MIL/uL   Hemoglobin 7.6 (*) 13.0 - 17.0 g/dL   HCT 22.8 (*) 39.0 - 52.0 %   MCV 92.7  78.0 - 100.0 fL   MCH 30.9  26.0 - 34.0 pg   MCHC 33.3  30.0 - 36.0 g/dL   RDW 14.9  11.5 - 15.5 %   Platelets 263  150 - 400 K/uL  DIFFERENTIAL     Status: Abnormal   Collection Time    05/26/13  4:22 AM      Result Value Range   Neutrophils Relative % 82 (*) 43 - 77 %   Neutro Abs 10.0 (*) 1.7 - 7.7 K/uL   Lymphocytes Relative 9 (*) 12 - 46 %   Lymphs Abs 1.1  0.7 - 4.0 K/uL   Monocytes Relative 8  3 - 12 %   Monocytes Absolute 1.0  0.1 - 1.0 K/uL   Eosinophils Relative 1  0 - 5 %   Eosinophils Absolute 0.1  0.0 - 0.7 K/uL   Basophils Relative 0  0 - 1 %   Basophils Absolute 0.0  0.0 - 0.1 K/uL  GLUCOSE, CAPILLARY     Status: Abnormal   Collection Time    05/26/13  7:30 AM      Result Value Range   Glucose-Capillary 138 (*) 70 - 99 mg/dL  GLUCOSE, CAPILLARY     Status: Abnormal   Collection Time    05/26/13 12:05 PM      Result Value Range   Glucose-Capillary 130 (*) 70 - 99 mg/dL   Dg Chest Port 1 View  05/24/2013   CLINICAL DATA:  Fever and leukocytosis.  Fall  EXAM: PORTABLE CHEST - 1 VIEW  COMPARISON:  04/16/2013  FINDINGS: The heart size and mediastinal contours are within normal limits for technique. Both lungs are clear. No effusion or pneumothorax. The visualized skeletal structures are unremarkable.  IMPRESSION: No active disease.   Electronically Signed   By: Jorje Guild M.D.   On:  05/24/2013 21:52    Assessment/Plan: Diagnosis: multifactorial gait disorder likely related to diabetic peripheral neuropathy, ?OA of ankles/ankle pain, orthostasis/anemia, and his other medical conditions 1. Does the need for close, 24 hr/day medical supervision in concert with the patient's rehab needs make it unreasonable for this patient to be served in a less intensive setting? Yes 2. Co-Morbidities requiring supervision/potential complications: ESRD, anemia, afib 3. Due to bladder management, bowel management, safety, skin/wound care, disease management, medication administration, pain management and patient education, does the patient require 24 hr/day rehab nursing? Yes 4. Does the patient require coordinated care of a physician, rehab nurse, PT (1-2 hrs/day, 5 days/week) and OT (1-2 hrs/day, 5 days/week) to address physical and functional deficits in the context of the above medical diagnosis(es)? Yes Addressing deficits in the following areas:  balance, endurance, locomotion, strength, transferring, bowel/bladder control, bathing, dressing, feeding, grooming, toileting and psychosocial support 5. Can the patient actively participate in an intensive therapy program of at least 3 hrs of therapy per day at least 5 days per week? Yes 6. The potential for patient to make measurable gains while on inpatient rehab is good 7. Anticipated functional outcomes upon discharge from inpatient rehab are supervision to mod I with PT, supervision to mod I with OT, n/a with SLP. 8. Estimated rehab length of stay to reach the above functional goals is: 8-12 days 9. Does the patient have adequate social supports to accommodate these discharge functional goals? Yes 10. Anticipated D/C setting: Home 11. Anticipated post D/C treatments: [facility] therapy 12. Overall Rehab/Functional Prognosis: excellent  RECOMMENDATIONS: This patient's condition is appropriate for continued rehabilitative care in the following  setting: CIR Patient has agreed to participate in recommended program. Yes Note that insurance prior authorization may be required for reimbursement for recommended care.  Comment: Pt would benefit from ongoing daily medical management while he participates in an interdisciplinary inpatient rehab program. In this way, in addition to building his strength and balance,  he will have a comprehensive plan to manage the causes and effects of his multiple medical issues. Rehab Admissions Coordinator to follow up.  Thanks,  Meredith Staggers, MD, [facility]     05/26/2013

## 2013-05-26 NOTE — Progress Notes (Signed)
05/26/2013 3:15 PM Hemodialysis Outpatient Note; this patient has been accepted at the [facility] center, [address]. Mr Neupert schedule will be Tuesday, Thursday and Saturday 2nd shift. The center will start treatment on Saturday January 10th, the patient just needs to arrive at the center at 1145AM. Thank you. Gordy Savers

## 2013-05-26 NOTE — Progress Notes (Signed)
Subjective:  Was in bathroom, and feels better with hd yesterday/ clip process started Objective Vital signs in last 24 hours: Filed Vitals:   05/25/13 1900 05/25/13 2042 05/26/13 0455 05/26/13 0929  BP: 109/69 130/68 126/55 92/49  Pulse: 104 101 83   Temp:  98.9 F (37.2 C) 98.8 F (37.1 C)   TempSrc:  Oral Oral   Resp: 16 15 16    Height:      Weight:  86.183 kg (190 lb)    SpO2:  96% 97%    Weight change: 0.183 kg (6.5 oz)  Labs: Basic Metabolic Panel:  Recent Labs Lab 05/24/13 1157 05/25/13 0314 05/26/13 0422  NA 135* 134* 141  K 3.4* 3.6* 3.8  CL 90* 88* 99  CO2 18* 19 26  GLUCOSE 314* 263* 159*  BUN 102* 118* 51*  CREATININE 10.02* 10.65* 5.67*  CALCIUM 6.6* 5.9* 6.9*  PHOS  --   --  4.7*   Liver Function Tests:  Recent Labs Lab 05/24/13 1157 05/26/13 0422  AST 26  --   ALT 21  --   ALKPHOS 136*  --   BILITOT 0.4  --   PROT 5.6*  --   ALBUMIN 1.7* 1.7*   CBC:  Recent Labs Lab 05/24/13 1157 05/25/13 0314 05/26/13 0422  WBC 17.7* 15.4* 12.1*  NEUTROABS 15.8*  --  10.0*  HGB 9.4* 8.6* 7.6*  HCT 27.5* 25.1* 22.8*  MCV 91.1 91.3 92.7  PLT 269 278 263    Recent Labs Lab 05/25/13 0902 05/25/13 1309 05/25/13 2049 05/26/13 0242 05/26/13 0730  GLUCAP 187* 124* 136* 192* 138*   Medications:   . aspirin EC  81 mg Oral Daily  . atorvastatin  10 mg Oral Daily  . azithromycin  500 mg Intravenous Q24H  . calcitRIOL  0.25 mcg Oral Daily  . calcium acetate  2,001 mg Oral TID WC  . cefTRIAXone (ROCEPHIN)  IV  1 g Intravenous Q24H  . cholecalciferol  1,000 Units Oral Daily  . cyclobenzaprine  10 mg Oral QHS  . darbepoetin (ARANESP) injection - DIALYSIS  200 mcg Intravenous Q Tue-HD  . feeding supplement (NEPRO CARB STEADY)  237 mL Oral BID BM  . gabapentin  100 mg Oral TID  . insulin aspart  0-15 Units Subcutaneous TID WC  . insulin aspart  0-5 Units Subcutaneous QHS  . insulin aspart  4 Units Subcutaneous TID WC  . insulin glargine  15 Units  Subcutaneous QHS  . midodrine  10 mg Oral TID WC  . multivitamin  1 tablet Oral QHS  . pantoprazole  40 mg Oral Daily  . sertraline  100 mg Oral Daily  . sodium chloride  3 mL Intravenous Q12H  . warfarin  2.5 mg Oral Custom  . warfarin  5 mg Oral Custom  . Warfarin - Pharmacist Dosing Inpatient   Does not apply q1800    Physical Exam: General: Alert nad, somewhat flat affect Heart: RRR, no rub or mur Lungs: CTA  Abdomen: Soft NT ND , pD cath in place Extremities: Dialysis Access: trace pedal edema/ Pos Bruit  L UA AVF    PD orders were= Dialyzes at home on PD since 02/2013 (prior to that home hemo and brief period of in center hemo)  Primary Nephrologist [place]  EDW 85  PD = 6 exchanges/night of 3 liters; last fill 2500, pause 2500; currently using all 2.5% dialysate with soft BP's but peripheral edema  Accesses = PD cath and RUE AVF  EPO = 21,000 units twice/week SQ  Problem/Plan:   1.ESRD - failure to thrive on PD;  Feels better with HD/  Noted bun 118 to 51 and cr10.6 to 5.6/ obtain op spot now  Prob. [facility]/ next hd in am   2. Generalized weakness/falls - multietiologies= uremia and  perhaps in part due to soft BP's - has been recent problem - and low 100's despite presence of edema. (low alb contributes to that); add midodrine for BP (TSH and cortisol both normal as outpt and 2D echo 03/2013 normal EF). Ask PT to see   3. DM - per primary   4 .Leukocytosis - Somewhat improved today to 12.1; on empiric rocephin and azithro; f/u CBC pre hd  5. Anemia - no overt bleeding; change SQ EPO to IV with HD (wll have to use ARANESP in hosp)   6. Malnutrition - supplements (Nepro BID) wants to try Breeze/ ? Uremia playing some role also  7.AF - RRR today, rate controlled; on AMIO and coumadin   8.Depression - noted by  Dr. Lorrene Reid  has been on zoloft 50/day - affect very flat/. Yesterday/increased Zoloft  to 100/day; ? If would benefit from neuro consult to r/o medical issues  like Parkinson's or dementia   Ernest Haber, PA-C [place] [phone] 05/26/2013,10:23 AM  LOS: 2 days

## 2013-05-26 NOTE — Progress Notes (Signed)
Rehab Admissions Coordinator Note:  Patient was screened by Retta Diones for appropriateness for an Inpatient Acute Rehab Consult.  At this time, we are recommending Inpatient Rehab consult.  Jodell Cipro M 05/26/2013, 10:00 AM  I can be reached at [phone].

## 2013-05-26 NOTE — Progress Notes (Signed)
ANTICOAGULATION CONSULT NOTE - Follow-up Consult  Pharmacy Consult:  Coumadin Indication: atrial fibrillation  Allergies  Allergen Reactions  . Metformin And Related Other (See Comments)    Kidney problems    Labs:  Recent Labs  05/24/13 1157 05/25/13 0314 05/26/13 0422  HGB 9.4* 8.6* 7.6*  HCT 27.5* 25.1* 22.8*  PLT 269 278 263  APTT 43*  --   --   LABPROT 23.2* 24.7* 30.2*  INR 2.14* 2.32* 3.02*  CREATININE 10.02* 10.65* 5.67*    Estimated Creatinine Clearance: 16.3 ml/min (by C-G formula based on Cr of 5.67).  Assessment: 13 YOM on Coumadin for h/o afib. Pt's INR is 3.02, lower dose of Coumadin today  Goal of Therapy:  INR 2-3 Monitor platelets by anticoagulation protocol: Yes    Plan:  - Continue home Coumadin 5mg  daily except for 2.5mg  po on Wed and Sun - F/U daily INR  Thank you. Anette Guarneri, PharmD [phone]  05/26/2013, 10:18 AM

## 2013-05-27 DIAGNOSIS — R5381 Other malaise: Secondary | ICD-10-CM

## 2013-05-27 DIAGNOSIS — R269 Unspecified abnormalities of gait and mobility: Secondary | ICD-10-CM

## 2013-05-27 LAB — RENAL FUNCTION PANEL
Albumin: 1.6 g/dL — ABNORMAL LOW (ref 3.5–5.2)
BUN: 97 mg/dL — ABNORMAL HIGH (ref 6–23)
CO2: 23 mEq/L (ref 19–32)
Calcium: 6.7 mg/dL — ABNORMAL LOW (ref 8.4–10.5)
Chloride: 97 mEq/L (ref 96–112)
Creatinine, Ser: 8.94 mg/dL — ABNORMAL HIGH (ref 0.50–1.35)
GFR calc Af Amer: 7 mL/min — ABNORMAL LOW (ref >90)
GFR calc non Af Amer: 6 mL/min — ABNORMAL LOW (ref >90)
Glucose, Bld: 90 mg/dL (ref 70–99)
Phosphorus: 7.4 mg/dL — ABNORMAL HIGH (ref 2.3–4.6)
Potassium: 4.1 mEq/L (ref 3.7–5.3)
Sodium: 139 mEq/L (ref 137–147)

## 2013-05-27 LAB — PROTIME-INR
INR: 4.36 — ABNORMAL HIGH (ref 0.00–1.49)
Prothrombin Time: 40 seconds — ABNORMAL HIGH (ref 11.6–15.2)

## 2013-05-27 LAB — CBC
HCT: 24.2 % — ABNORMAL LOW (ref 39.0–52.0)
HEMATOCRIT: 23.1 % — AB (ref 39.0–52.0)
Hemoglobin: 7.5 g/dL — ABNORMAL LOW (ref 13.0–17.0)
Hemoglobin: 8 g/dL — ABNORMAL LOW (ref 13.0–17.0)
MCH: 31.3 pg (ref 26.0–34.0)
MCH: 31.6 pg (ref 26.0–34.0)
MCHC: 32.5 g/dL (ref 30.0–36.0)
MCHC: 33.1 g/dL (ref 30.0–36.0)
MCV: 96.3 fL (ref 78.0–100.0)
MCV: 95.7 fL (ref 78.0–100.0)
PLATELETS: 299 10*3/uL (ref 150–400)
Platelets: 345 10*3/uL (ref 150–400)
RBC: 2.4 MIL/uL — ABNORMAL LOW (ref 4.22–5.81)
RBC: 2.53 MIL/uL — ABNORMAL LOW (ref 4.22–5.81)
RDW: 15.2 % (ref 11.5–15.5)
RDW: 15.1 % (ref 11.5–15.5)
WBC: 12.3 10*3/uL — AB (ref 4.0–10.5)
WBC: 13.8 10*3/uL — ABNORMAL HIGH (ref 4.0–10.5)

## 2013-05-27 LAB — GLUCOSE, CAPILLARY
GLUCOSE-CAPILLARY: 128 mg/dL — AB (ref 70–99)
GLUCOSE-CAPILLARY: 62 mg/dL — AB (ref 70–99)
GLUCOSE-CAPILLARY: 100 mg/dL — AB (ref 70–99)
Glucose-Capillary: 74 mg/dL (ref 70–99)
Glucose-Capillary: 257 mg/dL — ABNORMAL HIGH (ref 70–99)

## 2013-05-27 LAB — IRON AND TIBC
Iron: 26 ug/dL — ABNORMAL LOW (ref 42–135)
Saturation Ratios: 33 % (ref 20–55)
TIBC: 80 ug/dL — ABNORMAL LOW (ref 215–435)
UIBC: 54 ug/dL — ABNORMAL LOW (ref 125–400)

## 2013-05-27 MED ORDER — PENTAFLUOROPROP-TETRAFLUOROETH EX AERO
INHALATION_SPRAY | CUTANEOUS | Status: AC
  Administered 2013-05-27: 17:00:00
  Filled 2013-05-27: qty 103.5

## 2013-05-27 MED ORDER — NEPRO/CARBSTEADY PO LIQD: 237.0000 mL | Freq: Every day | ORAL | Status: DC | PRN

## 2013-05-27 MED ORDER — DOXERCALCIFEROL 4 MCG/2ML IV SOLN
INTRAVENOUS | Status: AC
  Administered 2013-05-27: 1 ug via INTRAVENOUS
  Filled 2013-05-27: qty 2

## 2013-05-27 NOTE — Progress Notes (Signed)
ANTICOAGULATION CONSULT NOTE - Follow-up Consult  Pharmacy Consult:  Coumadin Indication: atrial fibrillation  Allergies  Allergen Reactions  . Metformin And Related Other (See Comments)    Kidney problems    Labs:  Recent Labs  05/24/13 1157 05/25/13 0314 05/26/13 0422 05/27/13 0530  HGB 9.4* 8.6* 7.6* 7.5*  HCT 27.5* 25.1* 22.8* 23.1*  PLT 269 278 263 299  APTT 43*  --   --   --   LABPROT 23.2* 24.7* 30.2* 40.0*  INR 2.14* 2.32* 3.02* 4.36*  CREATININE 10.02* 10.65* 5.67*  --     Estimated Creatinine Clearance: 16.3 ml/min (by C-G formula based on Cr of 5.67).  Assessment: 12 YOM on Coumadin for h/o afib. Pt's INR is 4.36 today (jumped - no new interacting medications?)  Goal of Therapy:  INR 2-3 Monitor platelets by anticoagulation protocol: Yes    Plan:  - Hold Coumadin - F/U daily INR  Thank you. Anette Guarneri, PharmD [phone]  05/27/2013, 9:21 AM

## 2013-05-27 NOTE — Progress Notes (Signed)
I have seen and examined this patient and agree with plan as outlined by [facility].  Pt had definite FTT with PD.  Feels much better after a single HD.  Midodrine helping BP. Weakness improving and expect will benefit greatly from planned rehab stay.  He will not go back to PD until nutritional issues are resolved, depression better, and functional status improves.  It is my opinion that he would be better served by a permanent transition to hemo but he wants a little time to think about it (would need PD cath rem'd if he makes this decision).  He has outpt spot at [facility] TTS at whatever point he is discharged.   Keri Veale B,MD 05/27/2013 11:54 AM

## 2013-05-27 NOTE — Progress Notes (Signed)
HARD INDENTATION  BIL  LE  POST CALF TO ANKLE  BIL LE/ AREA ALSO  RED  TENDER   PAIN MED  GIVEN PATIENT  ASSISTED  X 2 UP TO CHAIR

## 2013-05-27 NOTE — Progress Notes (Signed)
INITIAL NUTRITION ASSESSMENT  DOCUMENTATION CODES Per approved criteria  -Not Applicable   INTERVENTION: Continue Resource Breeze TID Provide Nepro PRN for any missed meals with HD Provide Snack once daily  NUTRITION DIAGNOSIS: Increased nutrient needs related to hemodialysis as evidenced by estimated energy needs.   Goal: Pt to meet >/= 90% of their estimated nutrition needs   Monitor:  PO intake Weight Labs  Reason for Assessment: Consult  58 y.o. male  Admitting Dx: <principal problem not specified>  ASSESSMENT: 58 year old male has a history of diabetes mellitus with complications of neuropathy, hypokalemia, end stage chronic kidney disease on peritoneal dialysis at home daily, who presented to [facility] ED with main concern of several weeks duration of progressively worsening generalized weakness, one episode of fall earlier today prior to this admission, while he was trying to get out of the bed.   Pt reports that he has had a poor appetite for the past few months. He reports eating 3 meals daily PTA but, eating less than usual. Per nursing notes pt is eating 100% of 3 meals daily but, pt reports that he is just eating the stuff he likes. He reports his usual dry weight is 180 lbs. Pt drank 2 Nepro Shakes yesterday but only likes it in a box, not from a can.  Per MD note, pt feels better with HD and is to start HD on TTS.   Height: Ht Readings from Last 1 Encounters:  05/24/13 6' 2.02" (1.88 m)    Weight: Wt Readings from Last 1 Encounters:  05/25/13 190 lb (86.183 kg)    Ideal Body Weight: 190 lbs  % Ideal Body Weight: 100%  Wt Readings from Last 10 Encounters:  05/25/13 190 lb (86.183 kg)  05/15/13 189 lb 9.5 oz (85.999 kg)  04/23/13 208 lb 6.4 oz (94.53 kg)  04/16/13 190 lb (86.183 kg)  04/04/13 190 lb 11.2 oz (86.5 kg)  01/14/13 179 lb 14.3 oz (81.6 kg)  01/14/13 179 lb 14.3 oz (81.6 kg)  12/28/12 181 lb 9.6 oz (82.373 kg)  09/23/12 181 lb (82.101 kg)   07/07/12 187 lb 8 oz (85.049 kg)    Usual Body Weight: 180 lbs  % Usual Body Weight: 105%  BMI:  Body mass index is 24.38 kg/(m^2).  Estimated Nutritional Needs: Kcal: 2600-2800 Protein: >/= 100 grams daily Fluid: 2.5 L/day  Skin: +3 RUE and LUE edema  Diet Order: Renal  EDUCATION NEEDS: -No education needs identified at this time   Intake/Output Summary (Last 24 hours) at 05/27/13 1247 Last data filed at 05/27/13 0900  Gross per 24 hour  Intake    720 ml  Output      1 ml  Net    719 ml    Last BM: 1/4   Labs:   Recent Labs Lab 05/24/13 1157 05/25/13 0314 05/26/13 0422  NA 135* 134* 141  K 3.4* 3.6* 3.8  CL 90* 88* 99  CO2 18* 19 26  BUN 102* 118* 51*  CREATININE 10.02* 10.65* 5.67*  CALCIUM 6.6* 5.9* 6.9*  PHOS  --   --  4.7*  GLUCOSE 314* 263* 159*    CBG (last 3)   Recent Labs  05/27/13 0322 05/27/13 0801 05/27/13 1202  GLUCAP 128* 62* 74    Scheduled Meds: . aspirin EC  81 mg Oral Daily  . atorvastatin  10 mg Oral Daily  . calcium acetate  2,001 mg Oral TID WC  . cholecalciferol  1,000 Units Oral Daily  .  cyclobenzaprine  10 mg Oral QHS  . darbepoetin (ARANESP) injection - DIALYSIS  200 mcg Intravenous Q Tue-HD  . doxercalciferol  1 mcg Intravenous Q T,Th,Sa-HD  . feeding supplement (RESOURCE BREEZE)  1 Container Oral TID WC  . gabapentin  100 mg Oral TID  . insulin aspart  0-15 Units Subcutaneous TID WC  . insulin aspart  0-5 Units Subcutaneous QHS  . insulin aspart  4 Units Subcutaneous TID WC  . insulin glargine  15 Units Subcutaneous QHS  . midodrine  10 mg Oral TID WC  . multivitamin  1 tablet Oral QHS  . pantoprazole  40 mg Oral Daily  . sertraline  100 mg Oral Daily  . sodium chloride  3 mL Intravenous Q12H  . Warfarin - Pharmacist Dosing Inpatient   Does not apply q1800    Continuous Infusions:   Past Medical History  Diagnosis Date  . Diabetic neuropathy   . Cancer     History of Duodenal Cancer  . Chronic  kidney disease     Pt is not eligible for transplant per [facility]  . Diabetes mellitus     Type 2 iddm x 11 yrs  . Anemia     esrd  . Hypertension     meds d/c 'd  2 wks ago for postural hypotension  . Fatigue      tires easily x 2 months  . Chronic nausea     with  morning vomiting; better off  HTN meds  . Intermittent tremor   . Closed left arm fracture 1967  . Heart murmur   . Corneal abrasion, left   . Shortness of breath     exertional  . GERD (gastroesophageal reflux disease)   . History of blood transfusion   . Lack of coordination   . Muscle weakness (generalized)   . Glomerulosclerosis, diabetic   . Depression   . A-fib     Past Surgical History  Procedure Laterality Date  . Whipple procedure  2002    duodenal ca  . Bone marrow biopsy  2012  . Renal biopsy, percutaneous  2012  . Tonsillectomy    . Portacath placement  2002  . Biliary diversion, external  2002  . Other surgical history      Cyst removed from back  . Av fistula placement  06/07/2011    Procedure: ARTERIOVENOUS (AV) FISTULA CREATION;  Surgeon: Angelia Mould, MD;  Location: [place];  Service: Vascular;  Laterality: Left;  . Ligation of arteriovenous  fistula  05/22/2012    Procedure: LIGATION OF ARTERIOVENOUS  FISTULA;  Surgeon: Mal Misty, MD;  Location: [place];  Service: Vascular;  Laterality: Left;  Left brachio-cephalic fistula  . Insertion of dialysis catheter  05/22/2012    Procedure: INSERTION OF DIALYSIS CATHETER;  Surgeon: Mal Misty, MD;  Location: [place];  Service: Vascular;  Laterality: N/A;  right internal jugular vein  . Av fistula placement  06/18/2012    Procedure: ARTERIOVENOUS (AV) FISTULA CREATION;  Surgeon: Mal Misty, MD;  Location: [place];  Service: Vascular;  Laterality: Right;  [place]  . Colonoscopy      Hx: of  . Capd insertion N/A 01/14/2013    Procedure: LAPAROSCOPIC INSERTION CONTINUOUS AMBULATORY PERITONEAL DIALYSIS  (CAPD) CATHETER;  Surgeon: Adin Hector, MD;   Location: [place];  Service: General;  Laterality: N/A;    Pryor Ochoa RD, LDN Inpatient Clinical Dietitian Pager: [phone] After Hours Pager: [phone]

## 2013-05-27 NOTE — Progress Notes (Signed)
[facility] PROGRESS NOTE  Paul [name] [id number] DOB: 03-Apr-1956 DOA: 05/24/2013 PCP: [name], MD  Assessment/Plan: 58 year old male has a history of diabetes mellitus with complications of neuropathy, hypokalemia, end stage chronic kidney disease on peritoneal dialysis at home daily, who presented to [facility] ED with main concern of several weeks duration of progressively worsening generalized weakness, one episode of fall earlier today prior to this admission, while he was trying to get out of the bed. He explains he fell down and he hit his head but denies LOC. Pt explains he has been having generalized headaches since the fall, throbbing and constant, 3/10 in severity and non radiating no specific alleviating or aggravating factors, no visual changes. Pt currently denies chest pain or shortness of breath, no specific abdominal or urinary concerns, no similar events in the past. He also denies fevers, chills.  In ED, pt hemodynamically stable, electrolyte panel with several metabolic derangement including low K 3.4, sodium 135, Ca 10.2. TRH asked to admit for further evaluation . CT head  negative for acute intracranial abnormalities.   Assessment and Plan:  Generalized weakness with subsequent fall  -  review of records indicate similar events in the past due to low BP  - BP on this admission initially 90/60 with slight improvement one NS bolus to 100/60  - monitor on  Telemetry. -seen by renal and recommends placing him on HD instead of PD for better volume monitoring and until current issue resolve.  -added midodrine - seen by PT . recommends CIR. Consult placed.   ESRD  - on peritoneal dialysis  -renal consulted  Diabetes mellitus  - A1C 6.7 - continue SSI   Hypokalemia  - stable  Leukocytosis  - no sign of infection. Will d/c abx  Anemia of chronic disease  - baseline Hg appears to be 8-9 . Likely ACD. Slight drop noted this am. Monitor tomorrow   A-fib  Rate  controlled on amiodarone - continue Coumadin per pharmacy   Code Status: Full  Family Communication: Pt at bedside  Disposition Plan: CIR     HPI/Subjective: Feels good but has not been out of bed today  Objective: Filed Vitals:   05/27/13 0500  BP: 113/54  Pulse: 58  Temp: 99.1 F (37.3 C)  Resp: 18    Intake/Output Summary (Last 24 hours) at 05/27/13 1012 Last data filed at 05/27/13 0500  Gross per 24 hour  Intake    480 ml  Output      1 ml  Net    479 ml   Filed Weights   05/24/13 2148 05/25/13 2042  Weight: 86 kg (189 lb 9.5 oz) 86.183 kg (190 lb)    Exam:   General:  Middle aged thin built male in NAD   HEENT: no pallor, moist mucosa   chest: clear b/l, no added sounds   CVS: S1&S2 irregular, no MRG  Abd: soft, NT, ND, BS+  Ext: warm, no edema   CNS: AAAOX3  Data Reviewed: Basic Metabolic Panel:  Recent Labs Lab 05/24/13 1157 05/25/13 0314 05/26/13 0422  NA 135* 134* 141  K 3.4* 3.6* 3.8  CL 90* 88* 99  CO2 18* 19 26  GLUCOSE 314* 263* 159*  BUN 102* 118* 51*  CREATININE 10.02* 10.65* 5.67*  CALCIUM 6.6* 5.9* 6.9*  PHOS  --   --  4.7*   Liver Function Tests:  Recent Labs Lab 05/24/13 1157 05/26/13 0422  AST 26  --   ALT 21  --  ALKPHOS 136*  --   BILITOT 0.4  --   PROT 5.6*  --   ALBUMIN 1.7* 1.7*   No results found for this basename: LIPASE, AMYLASE,  in the last 168 hours No results found for this basename: AMMONIA,  in the last 168 hours CBC:  Recent Labs Lab 05/24/13 1157 05/25/13 0314 05/26/13 0422 05/27/13 0530  WBC 17.7* 15.4* 12.1* 12.3*  NEUTROABS 15.8*  --  10.0*  --   HGB 9.4* 8.6* 7.6* 7.5*  HCT 27.5* 25.1* 22.8* 23.1*  MCV 91.1 91.3 92.7 96.3  PLT 269 278 263 299   Cardiac Enzymes: No results found for this basename: CKTOTAL, CKMB, CKMBINDEX, TROPONINI,  in the last 168 hours BNP (last 3 results)  Recent Labs  04/02/13 1217  PROBNP 12548.0*   CBG:  Recent Labs Lab 05/26/13 1205  05/26/13 1654 05/26/13 2211 05/27/13 0322 05/27/13 0801  GLUCAP 130* 84 152* 128* 62*    Recent Results (from the past 240 hour(s))  BODY FLUID CULTURE     Status: None   Collection Time    05/24/13  6:04 PM      Result Value Range Status   Specimen Description PERITONEAL ABDOMEN   Final   Special Requests FROM PD PORT   Final   Gram Stain     Final   Value: RARE WBC PRESENT,BOTH PMN AND MONONUCLEAR     NO ORGANISMS SEEN     Performed at [facility]     Performed at [facility]   Culture     Final   Value: NO GROWTH 1 DAY     Performed at [facility]   Report Status PENDING   Incomplete  GRAM STAIN     Status: None   Collection Time    05/24/13  6:04 PM      Result Value Range Status   Specimen Description PERITONEAL ABDOMEN   Final   Special Requests PD PORT   Final   Gram Stain     Final   Value: RARE WBC PRESENT,BOTH PMN AND MONONUCLEAR     NO ORGANISMS SEEN   Report Status 05/24/2013 FINAL   Final     Studies: No results found.  Scheduled Meds: . aspirin EC  81 mg Oral Daily  . atorvastatin  10 mg Oral Daily  . calcium acetate  2,001 mg Oral TID WC  . cholecalciferol  1,000 Units Oral Daily  . cyclobenzaprine  10 mg Oral QHS  . darbepoetin (ARANESP) injection - DIALYSIS  200 mcg Intravenous Q Tue-HD  . doxercalciferol  1 mcg Intravenous Q T,Th,Sa-HD  . feeding supplement (RESOURCE BREEZE)  1 Container Oral TID WC  . gabapentin  100 mg Oral TID  . insulin aspart  0-15 Units Subcutaneous TID WC  . insulin aspart  0-5 Units Subcutaneous QHS  . insulin aspart  4 Units Subcutaneous TID WC  . insulin glargine  15 Units Subcutaneous QHS  . midodrine  10 mg Oral TID WC  . multivitamin  1 tablet Oral QHS  . pantoprazole  40 mg Oral Daily  . sertraline  100 mg Oral Daily  . sodium chloride  3 mL Intravenous Q12H  . Warfarin - Pharmacist Dosing Inpatient   Does not apply q1800   Continuous Infusions:     Time spent: [place], [place] [facility] Pager [phone] If 7PM-7AM, please contact night-coverage at [email or web address], password [facility] 05/27/2013, 10:12 AM  LOS: 3 days

## 2013-05-27 NOTE — Progress Notes (Signed)
Rehab admissions - Evaluated for possible admission.  I met with patient and explained rehab.  He would like to pursue inpatient rehab admission.  I have called and faxed information to [facility] requesting authorization.  Call me for questions.  #[phone]

## 2013-05-27 NOTE — Progress Notes (Signed)
[facility] Progress Note  Subjective:   C/o of burning in feet  Objective Filed Vitals:   05/26/13 1325 05/26/13 1728 05/26/13 2100 05/27/13 0500  BP: 101/60 122/63 124/68 113/54  Pulse: 80 63 65 58  Temp: 98 F (36.7 C) 98.2 F (36.8 C) 98.7 F (37.1 C) 99.1 F (37.3 C)  TempSrc: Oral Oral Oral Oral  Resp: 18 18 18 18   Height:      Weight:      SpO2: 98% 98% 99% 95%   Physical Exam General: pale somewhat flat affect, has body odor Heart: RRR Lungs: bilateral crackles L>R Abdomen: soft Extremities: tr LE edema Dialysis Access: right lower AVF + bruit  Assessment/Plan: 1.ESRD - failure to thrive on PD; Feels better with HD/ Noted bun 118 to 51 and cr10.6 to 5.6/ok to start at [place] [facility] on Saturday depending on d/c status; HD today  2. Generalized weakness/falls - multietiologies= uremia and perhaps in part due to soft BP's - has been recent problem - and low 100's despite presence of edema. (low alb contributes to that); add midodrine for BP (TSH and cortisol both normal as outpt and 2D echo 03/2013 normal EF). Inpt rehab consult was recommended  3. DM - per primary  4 .Leukocytosis - Somewhat improved today to 12.3; on empiric rocephin and azithro; f/u CBC pre hd  5. Anemia -Hgb stable 7.5 x 2  no overt bleeding; change SQ EPO to IV with HD 200 Aranesp given here; Need to check Fe studies with HD today; low Hgb might be interfering with feeling of wellbeing as well. 6. MBD- change po vit to iv vit d on hd / phos 4.7 and corrected ca 9.3  7. Malnutrition - supplements (Nepro BID) wants to try Breeze/ ? Uremia playing some role also - should improve with HD 8. AF - RRR today, rate controlled; on AMIO and coumadin - INR supratherapeutic - pharm following (tight hep HD) 9.Depression - noted by Dr. Lorrene Reid has been on zoloft 50/day - affect very flat/. Yesterday/increased Zoloft to 100/day; ? If would benefit from neuro consult to r/o medical issues like Parkinson's or  dementia  10/ HTN/volume - no BP meds; crackles on exam; attempt volume removal, though low Hgb may limit.  Myriam Jacobson, PA-C [facility] [phone] 05/27/2013,9:05 AM  LOS: 3 days    Additional Objective Labs: Lab Results  Component Value Date   INR 4.36* 05/27/2013   INR 3.02* 05/26/2013   INR 2.32* [id number]    Basic Metabolic Panel:  Recent Labs Lab 05/24/13 1157 05/25/13 0314 05/26/13 0422  NA 135* 134* 141  K 3.4* 3.6* 3.8  CL 90* 88* 99  CO2 18* 19 26  GLUCOSE 314* 263* 159*  BUN 102* 118* 51*  CREATININE 10.02* 10.65* 5.67*  CALCIUM 6.6* 5.9* 6.9*  PHOS  --   --  4.7*   Liver Function Tests:  Recent Labs Lab 05/24/13 1157 05/26/13 0422  AST 26  --   ALT 21  --   ALKPHOS 136*  --   BILITOT 0.4  --   PROT 5.6*  --   ALBUMIN 1.7* 1.7*   CBC:  Recent Labs Lab 05/24/13 1157 05/25/13 0314 05/26/13 0422 05/27/13 0530  WBC 17.7* 15.4* 12.1* 12.3*  NEUTROABS 15.8*  --  10.0*  --   HGB 9.4* 8.6* 7.6* 7.5*  HCT 27.5* 25.1* 22.8* 23.1*  MCV 91.1 91.3 92.7 96.3  PLT 269 278 263 299  CBG:  Recent Labs Lab 05/26/13 1205 05/26/13 1654 05/26/13 2211 05/27/13 0322 05/27/13 0801  GLUCAP 130* 84 152* 128* 62*  Medications:   . aspirin EC  81 mg Oral Daily  . atorvastatin  10 mg Oral Daily  . calcium acetate  2,001 mg Oral TID WC  . cholecalciferol  1,000 Units Oral Daily  . cyclobenzaprine  10 mg Oral QHS  . darbepoetin (ARANESP) injection - DIALYSIS  200 mcg Intravenous Q Tue-HD  . doxercalciferol  1 mcg Intravenous Q T,Th,Sa-HD  . feeding supplement (RESOURCE BREEZE)  1 Container Oral TID WC  . gabapentin  100 mg Oral TID  . insulin aspart  0-15 Units Subcutaneous TID WC  . insulin aspart  0-5 Units Subcutaneous QHS  . insulin aspart  4 Units Subcutaneous TID WC  . insulin glargine  15 Units Subcutaneous QHS  . midodrine  10 mg Oral TID WC  . multivitamin  1 tablet Oral QHS  . pantoprazole  40 mg Oral Daily  .  sertraline  100 mg Oral Daily  . sodium chloride  3 mL Intravenous Q12H  . warfarin  2.5 mg Oral Custom  . warfarin  5 mg Oral Custom  . Warfarin - Pharmacist Dosing Inpatient   Does not apply [phone]

## 2013-05-28 LAB — BODY FLUID CULTURE: CULTURE: NO GROWTH

## 2013-05-28 LAB — GLUCOSE, CAPILLARY
GLUCOSE-CAPILLARY: 208 mg/dL — AB (ref 70–99)
GLUCOSE-CAPILLARY: 175 mg/dL — AB (ref 70–99)
Glucose-Capillary: 167 mg/dL — ABNORMAL HIGH (ref 70–99)
Glucose-Capillary: 100 mg/dL — ABNORMAL HIGH (ref 70–99)
Glucose-Capillary: 114 mg/dL — ABNORMAL HIGH (ref 70–99)

## 2013-05-28 LAB — FERRITIN: Ferritin: 1066 ng/mL — ABNORMAL HIGH (ref 22–322)

## 2013-05-28 LAB — PROTIME-INR
INR: 3.37 — ABNORMAL HIGH (ref 0.00–1.49)
Prothrombin Time: 32.9 seconds — ABNORMAL HIGH (ref 11.6–15.2)

## 2013-05-28 MED ORDER — SODIUM CHLORIDE 0.9 % IV SOLN
125.0000 mg | INTRAVENOUS | Status: DC
  Administered 2013-05-29: 125 mg via INTRAVENOUS
  Filled 2013-05-28 (×2): qty 10

## 2013-05-28 MED ORDER — PHENOL 1.4 % MT LIQD
1.0000 | OROMUCOSAL | Status: DC | PRN
  Administered 2013-05-28: 1 via OROMUCOSAL
  Filled 2013-05-28: qty 177

## 2013-05-28 MED ORDER — MENTHOL 3 MG MT LOZG
1.0000 | LOZENGE | OROMUCOSAL | Status: DC | PRN
  Administered 2013-05-28 (×2): 3 mg via ORAL
  Filled 2013-05-28: qty 9

## 2013-05-28 NOTE — Progress Notes (Signed)
Physical Therapy Treatment Patient Details Name: Paul [name] MRN: [id number] DOB: November 05, 1955 Today's Date: 05/28/2013 Time: [id number] PT Time Calculation (min): 18 min  PT Assessment / Plan / Recommendation  History of Present Illness Patient is a 58 y/o male admitted with weakness and fall.  PMH positive for diabetes mellitus with complications of neuropathy, hypokalemia, end stage chronic kidney disease on peritoneal dialysis at home daily.   PT Comments   Pt progressing with mobility/PT goals.  Increased ambulation distance but does still present with LE weakness & experiences mild dizziness while up however BP WNL.    Cont to recommend CIR to maximize functional mobility & assist with pt getting back to PLOF.     Follow Up Recommendations  CIR;Supervision/Assistance - 24 hour     Does the patient have the potential to tolerate intense rehabilitation     Barriers to Discharge        Equipment Recommendations  Rolling walker with 5" wheels    Recommendations for Other Services Rehab consult  Frequency Min 3X/week   Progress towards PT Goals Progress towards PT goals: Progressing toward goals  Plan Current plan remains appropriate    Precautions / Restrictions Restrictions Weight Bearing Restrictions: No   Pertinent Vitals/Pain No c/o pain.  BP WNL.      Mobility  Transfers Overall transfer level: Needs assistance Equipment used: Rolling walker (2 wheeled) Transfers: Sit to/from Stand Sit to Stand: Min assist General transfer comment: cues for hand placement.  (A) to achieve standing, balance, & safety.   Ambulation/Gait Ambulation/Gait assistance: Min guard Ambulation Distance (Feet): 40 Feet Assistive device: Rolling walker (2 wheeled) Gait Pattern/deviations: Step-through pattern;Decreased stride length General Gait Details: Guarding for safety.  Pt c/o dizziness.  Knees buckled once but able to use UE's on RW to support himself & recover.        PT Goals  (current goals can now be found in the care plan section) Acute Rehab PT Goals PT Goal Formulation: With patient Time For Goal Achievement: 06/09/13 Potential to Achieve Goals: Good  Visit Information  Last PT Received On: 05/28/13 Assistance Needed: +1 History of Present Illness: Patient is a 58 y/o male admitted with weakness and fall.  PMH positive for diabetes mellitus with complications of neuropathy, hypokalemia, end stage chronic kidney disease on peritoneal dialysis at home daily.    Subjective Data      Cognition  Cognition Arousal/Alertness: Awake/alert Behavior During Therapy: WFL for tasks assessed/performed Overall Cognitive Status: Within Functional Limits for tasks assessed    Balance     End of Session PT - End of Session Equipment Utilized During Treatment: Gait belt Activity Tolerance: Patient tolerated treatment well Patient left: in chair;with call bell/phone within reach Nurse Communication: Mobility status   GP     Sena Hitch 05/28/2013, 3:08 PM  Sarajane Marek, PTA [phone] 05/28/2013

## 2013-05-28 NOTE — Progress Notes (Signed)
ANTICOAGULATION CONSULT NOTE - Follow-up Consult  Pharmacy Consult:  Coumadin Indication: atrial fibrillation  Allergies  Allergen Reactions  . Metformin And Related Other (See Comments)    Kidney problems    Labs:  Recent Labs  05/26/13 0422 05/27/13 0530 05/27/13 1448 05/28/13 0455  HGB 7.6* 7.5* 8.0*  --   HCT 22.8* 23.1* 24.2*  --   PLT 263 299 345  --   LABPROT 30.2* 40.0*  --  32.9*  INR 3.02* 4.36*  --  3.37*  CREATININE 5.67*  --  8.94*  --     Estimated Creatinine Clearance: 10.3 ml/min (by C-G formula based on Cr of 8.94).  Assessment: 56 YOM on Coumadin for h/o afib. Pt's INR is 3.37 today (trending down)  Goal of Therapy:  INR 2-3 Monitor platelets by anticoagulation protocol: Yes   Plan:  - Hold Coumadin - F/U daily INR  Thank you. Anette Guarneri, PharmD [phone]  05/28/2013, 9:46 AM

## 2013-05-28 NOTE — Progress Notes (Signed)
[facility] PROGRESS NOTE  Paul [name] [id number] DOB: Oct 01, 1955 DOA: 05/24/2013 PCP: [name], MD  Assessment/Plan: 58 year old male has a history of diabetes mellitus with complications of neuropathy, hypokalemia, end stage chronic kidney disease on peritoneal dialysis at home daily, who presented to [facility] ED with main concern of several weeks duration of progressively worsening generalized weakness, one episode of fall earlier today prior to this admission, while he was trying to get out of the bed. He explains he fell down and he hit his head but denies LOC. Pt explains he has been having generalized headaches since the fall, throbbing and constant, 3/10 in severity and non radiating no specific alleviating or aggravating factors, no visual changes. Pt currently denies chest pain or shortness of breath, no specific abdominal or urinary concerns, no similar events in the past. He also denies fevers, chills.  In ED, pt hemodynamically stable, electrolyte panel with several metabolic derangement including low K 3.4, sodium 135, Ca 10.2. TRH asked to admit for further evaluation . CT head  negative for acute intracranial abnormalities.   Assessment and Plan:  Generalized weakness with subsequent fall  -  review of records indicate similar events in the past due to low BP  - BP on this admission initially 90/60 with slight improvement one NS bolus to 100/60  - monitor on  Telemetry. -seen by renal and recommends placing him on HD instead of PD for better volume monitoring and until current issue resolve.  -added midodrine - seen by PT . recommends CIR. Consult placed- awaiting insurance approval   ESRD  - on peritoneal dialysis  -renal consulted  Diabetes mellitus  - A1C 6.7 - continue SSI   Hypokalemia  - stable  Leukocytosis  - no sign of infection. Will d/c abx  Anemia of chronic disease  - baseline Hg appears to be 8-9 . Likely ACD. Slight drop noted this am. Monitor  tomorrow   A-fib  Rate controlled on amiodarone - continue Coumadin per pharmacy   Code Status: Full  Family Communication: Pt at bedside  Disposition Plan: CIR     HPI/Subjective: Sore throat today No fever, no chills  Objective: Filed Vitals:   05/28/13 1018  BP: 116/63  Pulse: 67  Temp:   Resp:     Intake/Output Summary (Last 24 hours) at 05/28/13 1250 Last data filed at 05/28/13 1000  Gross per 24 hour  Intake    240 ml  Output      0 ml  Net    240 ml   Filed Weights   05/24/13 2148 05/25/13 2042  Weight: 86 kg (189 lb 9.5 oz) 86.183 kg (190 lb)    Exam:   General:  Middle aged thin built male in NAD   chest: clear b/l, no added sounds   CVS: S1&S2 irregular, no MRG  Abd: soft, NT, ND, BS+  Ext: warm, no edema   CNS: AAAOX3  Data Reviewed: Basic Metabolic Panel:  Recent Labs Lab 05/24/13 1157 05/25/13 0314 05/26/13 0422 05/27/13 1448  NA 135* 134* 141 139  K 3.4* 3.6* 3.8 4.1  CL 90* 88* 99 97  CO2 18* 19 26 23   GLUCOSE 314* 263* 159* 90  BUN 102* 118* 51* 97*  CREATININE 10.02* 10.65* 5.67* 8.94*  CALCIUM 6.6* 5.9* 6.9* 6.7*  PHOS  --   --  4.7* 7.4*   Liver Function Tests:  Recent Labs Lab 05/24/13 1157 05/26/13 0422 05/27/13 1448  AST 26  --   --  ALT 21  --   --   ALKPHOS 136*  --   --   BILITOT 0.4  --   --   PROT 5.6*  --   --   ALBUMIN 1.7* 1.7* 1.6*   No results found for this basename: LIPASE, AMYLASE,  in the last 168 hours No results found for this basename: AMMONIA,  in the last 168 hours CBC:  Recent Labs Lab 05/24/13 1157 05/25/13 0314 05/26/13 0422 05/27/13 0530 05/27/13 1448  WBC 17.7* 15.4* 12.1* 12.3* 13.8*  NEUTROABS 15.8*  --  10.0*  --   --   HGB 9.4* 8.6* 7.6* 7.5* 8.0*  HCT 27.5* 25.1* 22.8* 23.1* 24.2*  MCV 91.1 91.3 92.7 96.3 95.7  PLT 269 278 263 299 345   Cardiac Enzymes: No results found for this basename: CKTOTAL, CKMB, CKMBINDEX, TROPONINI,  in the last 168 hours BNP (last 3  results)  Recent Labs  04/02/13 1217  PROBNP 12548.0*   CBG:  Recent Labs Lab 05/27/13 1901 05/27/13 2208 05/28/13 0322 05/28/13 0800 05/28/13 1133  GLUCAP 100* 257* 208* 175* 167*    Recent Results (from the past 240 hour(s))  BODY FLUID CULTURE     Status: None   Collection Time    05/24/13  6:04 PM      Result Value Range Status   Specimen Description PERITONEAL ABDOMEN   Final   Special Requests FROM PD PORT   Final   Gram Stain     Final   Value: RARE WBC PRESENT,BOTH PMN AND MONONUCLEAR     NO ORGANISMS SEEN     Performed at [facility]     Performed at [facility]   Culture     Final   Value: NO GROWTH 3 DAYS     Performed at [facility]   Report Status 05/28/2013 FINAL   Final  GRAM STAIN     Status: None   Collection Time    05/24/13  6:04 PM      Result Value Range Status   Specimen Description PERITONEAL ABDOMEN   Final   Special Requests PD PORT   Final   Gram Stain     Final   Value: RARE WBC PRESENT,BOTH PMN AND MONONUCLEAR     NO ORGANISMS SEEN   Report Status 05/24/2013 FINAL   Final     Studies: No results found.  Scheduled Meds: . aspirin EC  81 mg Oral Daily  . atorvastatin  10 mg Oral Daily  . calcium acetate  2,001 mg Oral TID WC  . cholecalciferol  1,000 Units Oral Daily  . cyclobenzaprine  10 mg Oral QHS  . darbepoetin (ARANESP) injection - DIALYSIS  200 mcg Intravenous Q Tue-HD  . doxercalciferol  1 mcg Intravenous Q T,Th,Sa-HD  . feeding supplement (RESOURCE BREEZE)  1 Container Oral TID WC  . [START ON 05/29/2013] ferric gluconate (FERRLECIT/NULECIT) IV  125 mg Intravenous Q T,Th,Sa-HD  . gabapentin  100 mg Oral TID  . insulin aspart  0-15 Units Subcutaneous TID WC  . insulin aspart  0-5 Units Subcutaneous QHS  . insulin aspart  4 Units Subcutaneous TID WC  . insulin glargine  15 Units Subcutaneous QHS  . midodrine  10 mg Oral TID WC  . multivitamin  1 tablet Oral QHS  . pantoprazole  40 mg Oral  Daily  . sertraline  100 mg Oral Daily  . sodium chloride  3 mL Intravenous Q12H  . Warfarin -  Pharmacist Dosing Inpatient   Does not apply q1800   Continuous Infusions:     Time spent: [place] [place] [facility] Pager [phone] If 7PM-7AM, please contact night-coverage at [email or web address], password [facility] 05/28/2013, 12:50 PM  LOS: 4 days

## 2013-05-28 NOTE — Progress Notes (Addendum)
Subjective:   Feels better after 2nd hd yesterday / feet pain resolved with gabapentin and 2 hd txs Rehab med admit pending ??pending approval by [employer] Objective Vital signs in last 24 hours: Filed Vitals:   05/27/13 1815 05/27/13 1900 05/27/13 2100 05/28/13 0500  BP: 121/67 131/67 110/60 121/61  Pulse: 70 69 81 59  Temp:  98.7 F (37.1 C) 98.9 F (37.2 C) 97.9 F (36.6 C)  TempSrc:  Oral Oral Oral  Resp: 16 16 18 16   Height:      Weight:      SpO2:  99% 98% 99%   Weight change:   Physical Exam  General:  Alert, nad, Better affect clearly brighter and more animated Heart: RRR , no rub or mur Lungs: faint r crackle Abdomen: soft NT ND. PD cath in place. Dressing on exit site Extremities: trace bipedal edema Reddish changes of LE's improved  Dialysis Access: R FA  AVF + bruit   Recent Labs Lab 05/25/13 0314 05/26/13 0422 05/27/13 1448  NA 134* 141 139  K 3.6* 3.8 4.1  CL 88* 99 97  CO2 19 26 23   GLUCOSE 263* 159* 90  BUN 118* 51* 97*  CREATININE 10.65* 5.67* 8.94*  CALCIUM 5.9* 6.9* 6.7*  PHOS  --  4.7* 7.4*   Liver Function Tests:  Recent Labs Lab 05/24/13 1157 05/26/13 0422 05/27/13 1448  AST 26  --   --   ALT 21  --   --   ALKPHOS 136*  --   --   BILITOT 0.4  --   --   PROT 5.6*  --   --   ALBUMIN 1.7* 1.7* 1.6*   CBC:  Recent Labs Lab 05/24/13 1157 05/25/13 0314 05/26/13 0422 05/27/13 0530 05/27/13 1448  WBC 17.7* 15.4* 12.1* 12.3* 13.8*  NEUTROABS 15.8*  --  10.0*  --   --   HGB 9.4* 8.6* 7.6* 7.5* 8.0*  HCT 27.5* 25.1* 22.8* 23.1* 24.2*  MCV 91.1 91.3 92.7 96.3 95.7  PLT 269 278 263 299 345    Recent Labs Lab 05/27/13 0801 05/27/13 1202 05/27/13 1901 05/27/13 2208 05/28/13 0322  GLUCAP 62* 74 100* 257* 208*   Results for ROBBIE, DOU (MRN [id number]) as of 05/28/2013 12:39  Ref. Range 05/27/2013 14:48  Iron Latest Range: 42-135 ug/dL 26 (L)  UIBC Latest Range: 125-400 ug/dL 54 (L)  TIBC Latest Range: 215-435 ug/dL 80 (L)   Saturation Ratios Latest Range: 20-55 % 33  Ferritin Latest Range: 22-322 ng/mL 1066 (H)   Lab Results  Component Value Date   INR 3.37* 05/28/2013   INR 4.36* 05/27/2013   INR 3.02* 05/26/2013   Studies/Results: No results found. Medications:   . aspirin EC  81 mg Oral Daily  . atorvastatin  10 mg Oral Daily  . calcium acetate  2,001 mg Oral TID WC  . cholecalciferol  1,000 Units Oral Daily  . cyclobenzaprine  10 mg Oral QHS  . darbepoetin (ARANESP) injection - DIALYSIS  200 mcg Intravenous Q Tue-HD  . doxercalciferol  1 mcg Intravenous Q T,Th,Sa-HD  . feeding supplement (RESOURCE BREEZE)  1 Container Oral TID WC  . gabapentin  100 mg Oral TID  . insulin aspart  0-15 Units Subcutaneous TID WC  . insulin aspart  0-5 Units Subcutaneous QHS  . insulin aspart  4 Units Subcutaneous TID WC  . insulin glargine  15 Units Subcutaneous QHS  . midodrine  10 mg Oral TID WC  . multivitamin  1 tablet Oral QHS  . pantoprazole  40 mg Oral Daily  . sertraline  100 mg Oral Daily  . sodium chloride  3 mL Intravenous Q12H  . Warfarin - Pharmacist Dosing Inpatient   Does not apply q1800   Assessment/Plan:  1.ESRD - FTT on  PD; Feels better with HD x 2/ Note admit bun 118 to 51 and admit cr 10.6 to 5.6/OP HD will be TTS as outpt (next on Saturday depending on d/c status)/prob Rehab admit sec to weakness; HD in am  2. Generalized weakness/falls - multi etiologies= uremia, anemia and soft BP's - low bp has been recent problem - and low 100's despite presence of edema. (low alb contributes to that);midodrine added  for BP (TSH and cortisol both normal as outpt and 2D echo 03/2013 normal EF). Inpt rehab consult seeing  3. DM - per primary   4 .Leukocytosis - antibiotics were d/c'd. Has persistent leukocytosis as of 1/8; f/u CBC pre hd   5. Anemia -Hgb improving 7.5 x 2> 8.0  no overt bleeding; Aransep  IV with HD 200 Aranesp given here;TSat was 33 but total iron only 26 - will replete IV with HD for 5  doses  6. MBD- change po vit to iv vit d on hd / phos 4.7 - 7.4 yesterday ?and corrected ca 9.1   7. Malnutrition - supplements (Nepro BID) likes Breeze/  Uremia playing some role also - should improve with coming off PD  8. AF - RRR today, rate controlled; on AMIO and coumadin - INR 3.37 - pharm following (tight hep HD)   9.Depression - noted by Dr. Lorrene Reid had been on zoloft 50/day - affect improving/. this admit increased Zoloft to 100/day;   10.HTN/volume - bp 121/63 this am/ on midodrine;  attempt volume removal  In am 2 to 3 l as tolerated  11. Neuropathy- Gabapentin 100mg  tid helping   Ernest Haber, PA-C [place] [phone] 05/28/2013,7:59 AM  LOS: 4 days  I have seen and examined this patient and agree with plan with highlighted additions.  Since transitioning from HD-->PD, numbers are better; affect is improved, weakness still issue (hoping for rehab stay).  He will stay on hemo until nutrition, mobility and depression are improved.  He will ultimately have to make a decision about permanent HD vs possible return to PD when above parameters improved.  He wants to keep the PD catheter for now, speak with his wife.  I think he is leaning toward staying more permanently with hemo.   Jayelyn Barno B,MD 05/28/2013 12:43 PM

## 2013-05-28 NOTE — Progress Notes (Signed)
Rehab admissions - I spoke with [place].  The case has been sent to the med director for review.  I should have a decision Monday am regarding possible inpatient rehab admission.  Call me for questions.  [id number]

## 2013-05-29 LAB — RENAL FUNCTION PANEL
Albumin: 1.6 g/dL — ABNORMAL LOW (ref 3.5–5.2)
BUN: 68 mg/dL — AB (ref 6–23)
CHLORIDE: 95 meq/L — AB (ref 96–112)
CO2: 20 mEq/L (ref 19–32)
Calcium: 7.2 mg/dL — ABNORMAL LOW (ref 8.4–10.5)
Creatinine, Ser: 7.49 mg/dL — ABNORMAL HIGH (ref 0.50–1.35)
GFR calc Af Amer: 8 mL/min — ABNORMAL LOW (ref >90)
GFR, EST NON AFRICAN AMERICAN: 7 mL/min — AB (ref >90)
Glucose, Bld: 138 mg/dL — ABNORMAL HIGH (ref 70–99)
PHOSPHORUS: 5.4 mg/dL — AB (ref 2.3–4.6)
Potassium: 4.2 mEq/L (ref 3.7–5.3)
SODIUM: 137 meq/L (ref 137–147)

## 2013-05-29 LAB — GLUCOSE, CAPILLARY
GLUCOSE-CAPILLARY: 46 mg/dL — AB (ref 70–99)
GLUCOSE-CAPILLARY: 55 mg/dL — AB (ref 70–99)
GLUCOSE-CAPILLARY: 57 mg/dL — AB (ref 70–99)
GLUCOSE-CAPILLARY: 147 mg/dL — AB (ref 70–99)
Glucose-Capillary: 69 mg/dL — ABNORMAL LOW (ref 70–99)
Glucose-Capillary: 52 mg/dL — ABNORMAL LOW (ref 70–99)
Glucose-Capillary: 166 mg/dL — ABNORMAL HIGH (ref 70–99)

## 2013-05-29 LAB — PROTIME-INR
INR: 2.82 — AB (ref 0.00–1.49)
PROTHROMBIN TIME: 28.7 s — AB (ref 11.6–15.2)

## 2013-05-29 LAB — CBC
HEMATOCRIT: 23 % — AB (ref 39.0–52.0)
Hemoglobin: 7.6 g/dL — ABNORMAL LOW (ref 13.0–17.0)
MCH: 31.1 pg (ref 26.0–34.0)
MCHC: 33 g/dL (ref 30.0–36.0)
MCV: 94.3 fL (ref 78.0–100.0)
Platelets: 276 10*3/uL (ref 150–400)
RBC: 2.44 MIL/uL — ABNORMAL LOW (ref 4.22–5.81)
RDW: 14.7 % (ref 11.5–15.5)
WBC: 7.4 10*3/uL (ref 4.0–10.5)

## 2013-05-29 MED ORDER — DEXTROSE 50 % IV SOLN
INTRAVENOUS | Status: AC
  Filled 2013-05-29: qty 50

## 2013-05-29 MED ORDER — DOXERCALCIFEROL 4 MCG/2ML IV SOLN
INTRAVENOUS | Status: AC
  Administered 2013-05-29: 1 ug
  Filled 2013-05-29: qty 2

## 2013-05-29 MED ORDER — HEPARIN SODIUM (PORCINE) 1000 UNIT/ML IJ SOLN
1700.0000 [IU] | Freq: Once | INTRAMUSCULAR | Status: AC
  Administered 2013-05-29: 1700 [IU] via INTRAVENOUS

## 2013-05-29 MED ORDER — INSULIN GLARGINE 100 UNIT/ML ~~LOC~~ SOLN
12.0000 [IU] | Freq: Every day | SUBCUTANEOUS | Status: DC
  Filled 2013-05-29: qty 0.12

## 2013-05-29 MED ORDER — GLUCOSE 40 % PO GEL
1.0000 | ORAL | Status: DC | PRN
  Administered 2013-05-29: 37.5 g via ORAL

## 2013-05-29 MED ORDER — WARFARIN SODIUM 2 MG PO TABS
2.0000 mg | ORAL_TABLET | Freq: Once | ORAL | Status: AC
  Administered 2013-05-29: 2 mg via ORAL
  Filled 2013-05-29: qty 1

## 2013-05-29 MED ORDER — DEXTROSE 50 % IV SOLN
INTRAVENOUS | Status: AC
  Administered 2013-05-29: 50 mL
  Filled 2013-05-29: qty 50

## 2013-05-29 MED ORDER — INSULIN GLARGINE 100 UNIT/ML ~~LOC~~ SOLN
10.0000 [IU] | Freq: Every day | SUBCUTANEOUS | Status: DC
  Filled 2013-05-29: qty 0.1

## 2013-05-29 MED ORDER — MIDODRINE HCL 5 MG PO TABS
ORAL_TABLET | ORAL | Status: AC
  Administered 2013-05-29: 10 mg
  Filled 2013-05-29: qty 2

## 2013-05-29 NOTE — Progress Notes (Signed)
[facility] PROGRESS NOTE  Paul [name] [id number] DOB: 07/03/1955 DOA: 05/24/2013 PCP: [name], MD  Assessment/Plan: 58 year old male has a history of diabetes mellitus with complications of neuropathy, hypokalemia, end stage chronic kidney disease on peritoneal dialysis at home daily, who presented to [facility] ED with main concern of several weeks duration of progressively worsening generalized weakness, one episode of fall earlier today prior to this admission, while he was trying to get out of the bed. He explains he fell down and he hit his head but denies LOC. Pt explains he has been having generalized headaches since the fall, throbbing and constant, 3/10 in severity and non radiating no specific alleviating or aggravating factors, no visual changes. Pt currently denies chest pain or shortness of breath, no specific abdominal or urinary concerns, no similar events in the past. He also denies fevers, chills.  In ED, pt hemodynamically stable, electrolyte panel with several metabolic derangement including low K 3.4, sodium 135, Ca 10.2. TRH asked to admit for further evaluation . CT head  negative for acute intracranial abnormalities.   Assessment and Plan:  Generalized weakness with subsequent fall  -  review of records indicate similar events in the past due to low BP  -seen by renal and recommends placing him on HD instead of PD for better volume monitoring and until current issue resolve.  -added midodrine - seen by PT . recommends CIR. Consult placed- awaiting insurance approval   ESRD  - was on peritoneal dialysis  -renal consulted- changed to HD  Diabetes mellitus  - A1C 6.7 - continue SSI   Hypokalemia  - stable  Leukocytosis  - no sign of infection. Will d/c abx  Anemia of chronic disease  - baseline Hg appears to be 8-9 . Likely ACD. Slight drop noted this am. Monitor tomorrow   A-fib  Rate controlled on amiodarone - continue Coumadin per pharmacy   Code  Status: Full  Family Communication: Pt at bedside  Disposition Plan: CIR     HPI/Subjective: Throat still raw No CP, no SOB  Objective: Filed Vitals:   05/28/13 2220  BP: 137/68  Pulse: 67  Temp: 99.4 F (37.4 C)  Resp: 18    Intake/Output Summary (Last 24 hours) at 05/29/13 0842 Last data filed at 05/28/13 1800  Gross per 24 hour  Intake    720 ml  Output      1 ml  Net    719 ml   Filed Weights   05/25/13 2042 05/28/13 2220  Weight: 86.183 kg (190 lb) 88.1 kg (194 lb 3.6 oz)    Exam:   General:  Middle aged thin built male in NAD   chest: clear b/l, no added sounds   CVS: S1&S2 irregular, no MRG  Abd: soft, NT, ND, BS+  Ext: warm, no edema   CNS: AAAOX3  Data Reviewed: Basic Metabolic Panel:  Recent Labs Lab 05/24/13 1157 05/25/13 0314 05/26/13 0422 05/27/13 1448  NA 135* 134* 141 139  K 3.4* 3.6* 3.8 4.1  CL 90* 88* 99 97  CO2 18* 19 26 23   GLUCOSE 314* 263* 159* 90  BUN 102* 118* 51* 97*  CREATININE 10.02* 10.65* 5.67* 8.94*  CALCIUM 6.6* 5.9* 6.9* 6.7*  PHOS  --   --  4.7* 7.4*   Liver Function Tests:  Recent Labs Lab 05/24/13 1157 05/26/13 0422 05/27/13 1448  AST 26  --   --   ALT 21  --   --   ALKPHOS 136*  --   --  BILITOT 0.4  --   --   PROT 5.6*  --   --   ALBUMIN 1.7* 1.7* 1.6*   No results found for this basename: LIPASE, AMYLASE,  in the last 168 hours No results found for this basename: AMMONIA,  in the last 168 hours CBC:  Recent Labs Lab 05/24/13 1157 05/25/13 0314 05/26/13 0422 05/27/13 0530 05/27/13 1448  WBC 17.7* 15.4* 12.1* 12.3* 13.8*  NEUTROABS 15.8*  --  10.0*  --   --   HGB 9.4* 8.6* 7.6* 7.5* 8.0*  HCT 27.5* 25.1* 22.8* 23.1* 24.2*  MCV 91.1 91.3 92.7 96.3 95.7  PLT 269 278 263 299 345   Cardiac Enzymes: No results found for this basename: CKTOTAL, CKMB, CKMBINDEX, TROPONINI,  in the last 168 hours BNP (last 3 results)  Recent Labs  04/02/13 1217  PROBNP 12548.0*   CBG:  Recent  Labs Lab 05/28/13 0800 05/28/13 1133 05/28/13 1652 05/28/13 2213 05/29/13 0747  GLUCAP 175* 167* 100* 114* 69*    Recent Results (from the past 240 hour(s))  BODY FLUID CULTURE     Status: None   Collection Time    05/24/13  6:04 PM      Result Value Range Status   Specimen Description PERITONEAL ABDOMEN   Final   Special Requests FROM PD PORT   Final   Gram Stain     Final   Value: RARE WBC PRESENT,BOTH PMN AND MONONUCLEAR     NO ORGANISMS SEEN     Performed at [facility]     Performed at [facility]   Culture     Final   Value: NO GROWTH 3 DAYS     Performed at [facility]   Report Status 05/28/2013 FINAL   Final  GRAM STAIN     Status: None   Collection Time    05/24/13  6:04 PM      Result Value Range Status   Specimen Description PERITONEAL ABDOMEN   Final   Special Requests PD PORT   Final   Gram Stain     Final   Value: RARE WBC PRESENT,BOTH PMN AND MONONUCLEAR     NO ORGANISMS SEEN   Report Status 05/24/2013 FINAL   Final     Studies: No results found.  Scheduled Meds: . aspirin EC  81 mg Oral Daily  . atorvastatin  10 mg Oral Daily  . calcium acetate  2,001 mg Oral TID WC  . cholecalciferol  1,000 Units Oral Daily  . cyclobenzaprine  10 mg Oral QHS  . darbepoetin (ARANESP) injection - DIALYSIS  200 mcg Intravenous Q Tue-HD  . doxercalciferol  1 mcg Intravenous Q T,Th,Sa-HD  . feeding supplement (RESOURCE BREEZE)  1 Container Oral TID WC  . ferric gluconate (FERRLECIT/NULECIT) IV  125 mg Intravenous Q T,Th,Sa-HD  . gabapentin  100 mg Oral TID  . insulin aspart  0-15 Units Subcutaneous TID WC  . insulin aspart  0-5 Units Subcutaneous QHS  . insulin aspart  4 Units Subcutaneous TID WC  . insulin glargine  15 Units Subcutaneous QHS  . midodrine  10 mg Oral TID WC  . multivitamin  1 tablet Oral QHS  . pantoprazole  40 mg Oral Daily  . sertraline  100 mg Oral Daily  . sodium chloride  3 mL Intravenous Q12H  . Warfarin -  Pharmacist Dosing Inpatient   Does not apply q1800   Continuous Infusions:     Time spent: 25  Alvester Chou  [facility] Pager [phone] If 7PM-7AM, please contact night-coverage at [email or web address], password [facility] 05/29/2013, 8:42 AM  LOS: 5 days

## 2013-05-29 NOTE — Procedures (Signed)
Pt seen on HD.  Ap 180 Vp 120.  2K bath.  Tolerating HD well.

## 2013-05-29 NOTE — Progress Notes (Addendum)
Subjective:  awoken from  Sleep/ no cos/ hd today/ Pt. States ="still waiting for insurance to tell me about hosp rehab admit" [place] coming out of bathroom - knees buckled - blood sugar was 43; insulin has been reduced Objective Vital signs in last 24 hours: Filed Vitals:   05/28/13 1400 05/28/13 1800 05/28/13 2220 05/29/13 0927  BP: 121/67 92/54 137/68 118/62  Pulse: 66 79 67 73  Temp: 98 F (36.7 C) 98.2 F (36.8 C) 99.4 F (37.4 C) 99.6 F (37.6 C)  TempSrc: Oral Oral Oral   Resp: 18 18 18 18   Height:      Weight:   88.1 kg (194 lb 3.6 oz)   SpO2: 98% 96% 97% 97%   Weight change:   Labs: Basic Metabolic Panel:  Recent Labs Lab 05/25/13 0314 05/26/13 0422 05/27/13 1448  NA 134* 141 139  K 3.6* 3.8 4.1  CL 88* 99 97  CO2 19 26 23   GLUCOSE 263* 159* 90  BUN 118* 51* 97*  CREATININE 10.65* 5.67* 8.94*  CALCIUM 5.9* 6.9* 6.7*  PHOS  --  4.7* 7.4*   Liver Function Tests:  Recent Labs Lab 05/24/13 1157 05/26/13 0422 05/27/13 1448  AST 26  --   --   ALT 21  --   --   ALKPHOS 136*  --   --   BILITOT 0.4  --   --   PROT 5.6*  --   --   ALBUMIN 1.7* 1.7* 1.6*   No results found for this basename: LIPASE, AMYLASE,  in the last 168 hours No results found for this basename: AMMONIA,  in the last 168 hours CBC:  Recent Labs Lab 05/24/13 1157 05/25/13 0314 05/26/13 0422 05/27/13 0530 05/27/13 1448  WBC 17.7* 15.4* 12.1* 12.3* 13.8*  NEUTROABS 15.8*  --  10.0*  --   --   HGB 9.4* 8.6* 7.6* 7.5* 8.0*  HCT 27.5* 25.1* 22.8* 23.1* 24.2*  MCV 91.1 91.3 92.7 96.3 95.7  PLT 269 278 263 299 345   Cardiac Enzymes: No results found for this basename: CKTOTAL, CKMB, CKMBINDEX, TROPONINI,  in the last 168 hours CBG:  Recent Labs Lab 05/28/13 0800 05/28/13 1133 05/28/13 1652 05/28/13 2213 05/29/13 0747  GLUCAP 175* 167* 100* 114* 69*    Studies/Results: No results found. Medications:   . aspirin EC  81 mg Oral Daily  . atorvastatin  10 mg Oral Daily  .  calcium acetate  2,001 mg Oral TID WC  . cholecalciferol  1,000 Units Oral Daily  . cyclobenzaprine  10 mg Oral QHS  . darbepoetin (ARANESP) injection - DIALYSIS  200 mcg Intravenous Q Tue-HD  . doxercalciferol  1 mcg Intravenous Q T,Th,Sa-HD  . feeding supplement (RESOURCE BREEZE)  1 Container Oral TID WC  . ferric gluconate (FERRLECIT/NULECIT) IV  125 mg Intravenous Q T,Th,Sa-HD  . gabapentin  100 mg Oral TID  . insulin aspart  0-15 Units Subcutaneous TID WC  . insulin aspart  0-5 Units Subcutaneous QHS  . insulin aspart  4 Units Subcutaneous TID WC  . insulin glargine  12 Units Subcutaneous QHS  . midodrine  10 mg Oral TID WC  . multivitamin  1 tablet Oral QHS  . pantoprazole  40 mg Oral Daily  . sertraline  100 mg Oral Daily  . sodium chloride  3 mL Intravenous Q12H  . Warfarin - Pharmacist Dosing Inpatient   Does not apply q1800   Physical Exam  General: Alert, nad,  Affect  dull this am but just awoken from sleep Heart: RRR , no rub or mur  Lungs: CTA Abdomen: soft NT ND. PD cath in place. Dressing on exit site  Extremities: trace bipedal edema  With reddish bilat feet changes  Dialysis Access: R FA AVF + bruit  Assessment/Plan:  1.ESRD - FTT on PD; Feels better with HD x 2/ For third HD today; HD will be TTS as outpt. Rehab admit sec to weakness.if [employer] approves 2. Generalized weakness/falls - multi etiologies= uremia, anemia and soft BP's  Low bp has been recent problem despite presence of edema. (low alb contributes to that);midodrine added for BP (TSH and cortisol both normal as outpt and 2D echo 03/2013 normal EF). Inpt rehab consult awaiting Insurance okay for admit  3. DM - per primary Insulin has been reduced; may require further reductions since no longer has the glucose load from PD  4 .Leukocytosis - antibiotics were d/c'd. Has persistent leukocytosis as of 1/8;  f/u CBC pre hd today 5. Anemia -Hgb improving 7.5 x 2> 8.0 no overt bleeding; Aransep IV with HD 200  Aranesp given TSAT 33% but total iron only 26 -   Load with 5 doses here;  6. MBD- changed po calcitriol to IV hectorol; continue phoslo; check phos today  7. Malnutrition - supplements (Nepro BID) likes Breeze/ low alb  1.6   8. AF - RRR today, rate controlled; on AMIO and coumadin  - pharm following. INR 2.8 (tight hep HD)  9.Depression - Had been on zoloft 50/day - affect improving/. this admit increased Zoloft to 100/day;  10.HTN/volume - bp 121/63 this am/ on midodrine; attempt volume removal  2 to 3 l as tolerated  11. Neuropathy- Gabapentin 100mg  tid helping  Ernest Haber, PA-C [place] [phone] 05/29/2013,10:14 AM  LOS: 5 days  I have seen and examined this patient and agree with plan as outlined above with highlighted additions. For dialysis today. Await word as to whether Rehab will accept (insurance pending - should know on Monday).  Will need PD cath flush on Monday. Will need careful monitoring for recurrent hypoglycemia with insulin adjustments as necessary as no longer getting the large glucose load from PD. Ayman Brull B,MD 05/29/2013 12:12 PM

## 2013-05-29 NOTE — Progress Notes (Signed)
Hypoglycemic Event  CBG: 46  Treatment: 8 oz of apple juice plus 37.5 gram of gluco gel  Symptoms: weakness  Follow-up CBG: Time:1143 CBG Result: 52  Possible Reasons for Event: too much coverage?  Comments/MD notified: yes    Paul [name], Paul [name]  Remember to initiate Hypoglycemia Order Set & complete

## 2013-05-29 NOTE — Progress Notes (Signed)
Hypoglycemic Event  CBG: 57  Treatment: 25 cc dextrose 5%  Symptoms: none  Follow-up CBG: Time:1353 CBG Result: 147  Possible Reasons for Event:  unknown  Comments/MD notified: yes    Kolden Dupee, Zenon Mayo  Remember to initiate Hypoglycemia Order Set & complete

## 2013-05-29 NOTE — Progress Notes (Signed)
Hypoglycemic Event  CBG: 52  Treatment: lunch meal -100% intake plus 8 oz of juice,refused oral hypoglycemic protocol instead choose lunch meal  Symptoms: none  Follow-up CBG: Time:1219 CBG Result:  57  Possible Reasons for Event: unknown?  Comments/MD notified: yes    Marena Witts, Zenon Mayo  Remember to initiate Hypoglycemia Order Set & complete

## 2013-05-29 NOTE — Progress Notes (Signed)
ANTICOAGULATION CONSULT NOTE - Follow-up Consult  Pharmacy Consult:  Coumadin Indication: atrial fibrillation  Allergies  Allergen Reactions  . Metformin And Related Other (See Comments)    Kidney problems    Labs:  Recent Labs  05/27/13 0530 05/27/13 1448 05/28/13 0455 05/29/13 0250  HGB 7.5* 8.0*  --   --   HCT 23.1* 24.2*  --   --   PLT 299 345  --   --   LABPROT 40.0*  --  32.9* 28.7*  INR 4.36*  --  3.37* 2.82*  CREATININE  --  8.94*  --   --     Estimated Creatinine Clearance: 10.3 ml/min (by C-G formula based on Cr of 8.94).  Assessment: 49 YOM with history of Afib to continue on Coumadin from PTA. Patient's INR was therapeutic on admission, but jumped abruptly when continued on home dose. May need to reduce dose or questionable non-compliance at home.   Goal of Therapy:  INR 2-3 Monitor platelets by anticoagulation protocol: Yes   Plan:  Coumadin 2mg  x 1 tonight F/u daily INR  Corney Knighton B. Leitha Schuller, PharmD Clinical Pharmacist - Resident Pager: [phone] Phone: [phone] 05/29/2013 12:00 PM

## 2013-05-30 DIAGNOSIS — E876 Hypokalemia: Secondary | ICD-10-CM

## 2013-05-30 LAB — CBC
HCT: 25.5 % — ABNORMAL LOW (ref 39.0–52.0)
HCT: 26.7 % — ABNORMAL LOW (ref 39.0–52.0)
HEMOGLOBIN: 8.3 g/dL — AB (ref 13.0–17.0)
Hemoglobin: 8.4 g/dL — ABNORMAL LOW (ref 13.0–17.0)
MCH: 31.6 pg (ref 26.0–34.0)
MCH: 31 pg (ref 26.0–34.0)
MCHC: 32.5 g/dL (ref 30.0–36.0)
MCHC: 31.5 g/dL (ref 30.0–36.0)
MCV: 97 fL (ref 78.0–100.0)
MCV: 98.5 fL (ref 78.0–100.0)
Platelets: 310 10*3/uL (ref 150–400)
Platelets: 322 10*3/uL (ref 150–400)
RBC: 2.63 MIL/uL — ABNORMAL LOW (ref 4.22–5.81)
RBC: 2.71 MIL/uL — ABNORMAL LOW (ref 4.22–5.81)
RDW: 15.1 % (ref 11.5–15.5)
RDW: 15.4 % (ref 11.5–15.5)
WBC: 10.8 10*3/uL — ABNORMAL HIGH (ref 4.0–10.5)
WBC: 11.9 10*3/uL — AB (ref 4.0–10.5)

## 2013-05-30 LAB — BASIC METABOLIC PANEL
BUN: 31 mg/dL — AB (ref 6–23)
CALCIUM: 7.2 mg/dL — AB (ref 8.4–10.5)
CO2: 29 mEq/L (ref 19–32)
Chloride: 94 mEq/L — ABNORMAL LOW (ref 96–112)
Creatinine, Ser: 3.97 mg/dL — ABNORMAL HIGH (ref 0.50–1.35)
GFR, EST AFRICAN AMERICAN: 18 mL/min — AB (ref >90)
GFR, EST NON AFRICAN AMERICAN: 15 mL/min — AB (ref >90)
GLUCOSE: 212 mg/dL — AB (ref 70–99)
Potassium: 3.5 mEq/L — ABNORMAL LOW (ref 3.7–5.3)
Sodium: 137 mEq/L (ref 137–147)

## 2013-05-30 LAB — GLUCOSE, CAPILLARY
GLUCOSE-CAPILLARY: 171 mg/dL — AB (ref 70–99)
GLUCOSE-CAPILLARY: 131 mg/dL — AB (ref 70–99)
Glucose-Capillary: 211 mg/dL — ABNORMAL HIGH (ref 70–99)
Glucose-Capillary: 183 mg/dL — ABNORMAL HIGH (ref 70–99)
Glucose-Capillary: 156 mg/dL — ABNORMAL HIGH (ref 70–99)

## 2013-05-30 LAB — PROTIME-INR
INR: 2.33 — ABNORMAL HIGH (ref 0.00–1.49)
PROTHROMBIN TIME: 24.8 s — AB (ref 11.6–15.2)

## 2013-05-30 MED ORDER — WARFARIN SODIUM 2 MG PO TABS
2.0000 mg | ORAL_TABLET | Freq: Once | ORAL | Status: DC
  Administered 2013-05-30: 2 mg via ORAL
  Filled 2013-05-30: qty 1

## 2013-05-30 MED ORDER — POTASSIUM CHLORIDE CRYS ER 20 MEQ PO TBCR
40.0000 meq | EXTENDED_RELEASE_TABLET | Freq: Once | ORAL | Status: AC
  Administered 2013-05-30: 40 meq via ORAL
  Filled 2013-05-30: qty 2

## 2013-05-30 MED ORDER — INSULIN GLARGINE 100 UNIT/ML ~~LOC~~ SOLN
10.0000 [IU] | Freq: Every day | SUBCUTANEOUS | Status: DC
  Administered 2013-05-30 – 2013-05-31 (×2): 10 [IU] via SUBCUTANEOUS
  Filled 2013-05-30 (×2): qty 0.1

## 2013-05-30 NOTE — Discharge Summary (Signed)
Physician Discharge Summary  Paul [name] [id number] DOB: [birth year] DOA: 05/24/2013  PCP: [name], MD  Admit date: 05/24/2013 Discharge date: 05/30/2013  Time spent: 35 minutes  Recommendations for Outpatient Follow-up:  1. TO CIR 2. Monitor coumadin levels- take 2 mg 05/31/12  dressing changes daily and weekly flushes (flush 1/11)   Discharge Diagnoses:  Active Problems:   End stage renal disease   DM (diabetes mellitus)   Hypotension   Hypokalemia   Weakness   Discharge Condition: improved  Diet recommendation: diabetic  Filed Weights   05/28/13 2220 05/29/13 1437 05/29/13 1845  Weight: 88.1 kg (194 lb 3.6 oz) 88.2 kg (194 lb 7.1 oz) 85.4 kg (188 lb 4.4 oz)    History of present illness:  58 year old male has a history of diabetes mellitus with complications of neuropathy, hypokalemia, end stage chronic kidney disease on peritoneal dialysis at home daily, who presented to [facility] ED with main concern of several weeks duration of progressively worsening generalized weakness, one episode of fall earlier today prior to this admission, while he was trying to get out of the bed. He explains he fell down and he hit his head but denies LOC. Pt explains he has been having generalized headaches since the fall, throbbing and constant, 3/10 in severity and non radiating no specific alleviating or aggravating factors, no visual changes. Pt currently denies chest pain or shortness of breath, no specific abdominal or urinary concerns, no similar events in the past. He also denies fevers, chills.  In ED, pt hemodynamically stable, electrolyte panel with several metabolic derangement including low K 3.4, sodium 135, Ca 10.2. [facility] asked to admit for further evaluation and medical bed requested. CT head is negative for acute intracranial abnormalities.    Hospital Course:  Generalized weakness with subsequent fall  - review of records indicate similar events in the past due to low BP  -seen by  renal and recommends placing him on HD instead of PD for better volume monitoring and until current issue resolve.  -added midodrine   ESRD  - was on peritoneal dialysis  -renal consulted- changed to HD  he does not want removed - hasn't decided if he would ever go back on PD; certainly will not for a medical standpoint for at least 3 months --- so needs dressing changes daily and weekly flushes (flush 1/11)    Diabetes mellitus  - A1C 6.7  - continue SSI   Hypokalemia  - stable   Leukocytosis  - no sign of infection.  d/c abx   Anemia of chronic disease  - baseline Hg appears to be 8-9 . Monitor periodically  A-fib  Rate controlled on amiodarone  - continue Coumadin per pharmacy    Procedures:  HD  Consultations:  nephro  Discharge Exam: Filed Vitals:   05/30/13 0500  BP: 127/67  Pulse: 66  Temp: 99.3 F (37.4 C)  Resp: 18    General: pleasant/cooperative Cardiovascular: rrr Respiratory: clear anterior  Discharge Instructions       Future Appointments Provider Department Dept Phone   06/04/2013 10:45 AM [facility] [place] [facility] [phone]   08/06/2013 9:00 AM [name], MD [place] [phone]       Medication List    ASK your doctor about these medications       aspirin EC 81 MG tablet  Take 81 mg by mouth daily.     atorvastatin 10 MG tablet  Commonly known as:  LIPITOR  Take 10 mg by mouth daily.     calcitRIOL 0.25 MCG capsule  Commonly known as:  ROCALTROL  Take 0.25 mcg by mouth daily.     calcium acetate 667 MG capsule  Commonly known as:  PHOSLO  Take 667 mg by mouth 3 (three) times daily with meals.     cholecalciferol 1000 UNITS tablet  Commonly known as:  VITAMIN D  Take 1,000 Units by mouth daily.     cyclobenzaprine 10 MG tablet  Commonly known as:  FLEXERIL  Take 10 mg by mouth at bedtime.     epoetin alfa 20000 UNIT/ML injection  Commonly known as:   EPOGEN,PROCRIT  Inject 21,000 Units into the skin See admin instructions. 21,000 units every Monday and Friday     gabapentin 100 MG capsule  Commonly known as:  NEURONTIN  Take 1 capsule (100 mg total) by mouth 3 (three) times daily.     insulin glargine 100 UNIT/ML injection  Commonly known as:  LANTUS  Inject 20-27 Units into the skin every morning. Per sliding scale.  EX) if CBG is 120 then take 20 units, if CBG is 200 then take 27 units     metoCLOPramide 10 MG tablet  Commonly known as:  REGLAN  Take 1 tablet (10 mg total) by mouth every 6 (six) hours as needed for nausea (nausea/headache).     omeprazole 40 MG capsule  Commonly known as:  PRILOSEC  Take 40 mg by mouth daily.     ondansetron 8 MG disintegrating tablet  Commonly known as:  ZOFRAN-ODT  Take 8 mg by mouth every 8 (eight) hours as needed for nausea or vomiting.     sertraline 50 MG tablet  Commonly known as:  ZOLOFT  Take 50 mg by mouth daily.     warfarin 5 MG tablet  Commonly known as:  COUMADIN  Take 2.5-5 mg by mouth See admin instructions. Take 5mg  every day except on Wednesday and Sundays take 2.5mg        Allergies  Allergen Reactions  . Metformin And Related Other (See Comments)    Kidney problems      The results of significant diagnostics from this hospitalization (including imaging, microbiology, ancillary and laboratory) are listed below for reference.    Significant Diagnostic Studies: Ct Head Wo Contrast  05/24/2013   CLINICAL DATA:  Fall  EXAM: CT HEAD WITHOUT CONTRAST  CT CERVICAL SPINE WITHOUT CONTRAST  TECHNIQUE: Multidetector CT imaging of the head and cervical spine was performed following the standard protocol without intravenous contrast. Multiplanar CT image reconstructions of the cervical spine were also generated.  COMPARISON:  None.  FINDINGS: CT HEAD FINDINGS  Mild diffuse cerebral volume loss is seen, consistent with generalized atrophy. Scattered and confluent hypodensity  within the periventricular white matter is most compatible with mild chronic microvascular ischemic disease.  There is no acute intracranial hemorrhage or infarct. No mass lesion or midline shift. Gray-white matter differentiation is well maintained. Ventricles are normal in size without evidence of hydrocephalus. CSF containing spaces are within normal limits. No extra-axial fluid collection. Vascular calcifications noted within the carotid siphons.  The calvarium is intact.Orbital soft tissues are within normal limits.  Small calcified retention cyst noted within the left maxillary sinus. Paranasal sinuses are otherwise clear. No mastoid effusion.  Scalp soft tissues are unremarkable.  CT CERVICAL SPINE FINDINGS  The vertebral bodies are normally aligned with preservation of the normal cervical lordosis. Vertebral body heights are preserved. Normal  C1-2 articulations are intact. No prevertebral soft tissue swelling. No acute fracture or listhesis.  Moderate degenerative endplate osteophytosis seen anteriorly at C5-6. Degenerative disc bulge is noted at C3-4, C4-5, and C5-6.  Visualized soft tissues of the neck are within normal limits. Visualized lung apices are clear without evidence of apical pneumothorax.  IMPRESSION: CT BRAIN:  1. No acute intracranial abnormality. 2. Mild atrophy and chronic microvascular ischemic disease.  CT CERVICAL SPINE:  1. No acute fracture or traumatic malalignment within the cervical spine. 2. Moderate degenerative disc disease at C5-6.   Electronically Signed   By: Jeannine Boga M.D.   On: 05/24/2013 14:00   Ct Cervical Spine Wo Contrast  05/24/2013   CLINICAL DATA:  Fall  EXAM: CT HEAD WITHOUT CONTRAST  CT CERVICAL SPINE WITHOUT CONTRAST  TECHNIQUE: Multidetector CT imaging of the head and cervical spine was performed following the standard protocol without intravenous contrast. Multiplanar CT image reconstructions of the cervical spine were also generated.  COMPARISON:   None.  FINDINGS: CT HEAD FINDINGS  Mild diffuse cerebral volume loss is seen, consistent with generalized atrophy. Scattered and confluent hypodensity within the periventricular white matter is most compatible with mild chronic microvascular ischemic disease.  There is no acute intracranial hemorrhage or infarct. No mass lesion or midline shift. Gray-white matter differentiation is well maintained. Ventricles are normal in size without evidence of hydrocephalus. CSF containing spaces are within normal limits. No extra-axial fluid collection. Vascular calcifications noted within the carotid siphons.  The calvarium is intact.Orbital soft tissues are within normal limits.  Small calcified retention cyst noted within the left maxillary sinus. Paranasal sinuses are otherwise clear. No mastoid effusion.  Scalp soft tissues are unremarkable.  CT CERVICAL SPINE FINDINGS  The vertebral bodies are normally aligned with preservation of the normal cervical lordosis. Vertebral body heights are preserved. Normal C1-2 articulations are intact. No prevertebral soft tissue swelling. No acute fracture or listhesis.  Moderate degenerative endplate osteophytosis seen anteriorly at C5-6. Degenerative disc bulge is noted at C3-4, C4-5, and C5-6.  Visualized soft tissues of the neck are within normal limits. Visualized lung apices are clear without evidence of apical pneumothorax.  IMPRESSION: CT BRAIN:  1. No acute intracranial abnormality. 2. Mild atrophy and chronic microvascular ischemic disease.  CT CERVICAL SPINE:  1. No acute fracture or traumatic malalignment within the cervical spine. 2. Moderate degenerative disc disease at C5-6.   Electronically Signed   By: Jeannine Boga M.D.   On: 05/24/2013 14:00   Dg Chest Port 1 View  05/24/2013   CLINICAL DATA:  Fever and leukocytosis.  Fall  EXAM: PORTABLE CHEST - 1 VIEW  COMPARISON:  04/16/2013  FINDINGS: The heart size and mediastinal contours are within normal limits for  technique. Both lungs are clear. No effusion or pneumothorax. The visualized skeletal structures are unremarkable.  IMPRESSION: No active disease.   Electronically Signed   By: Jorje Guild M.D.   On: 05/24/2013 21:52    Microbiology: Recent Results (from the past 240 hour(s))  BODY FLUID CULTURE     Status: None   Collection Time    05/24/13  6:04 PM      Result Value Range Status   Specimen Description PERITONEAL ABDOMEN   Final   Special Requests FROM PD PORT   Final   Gram Stain     Final   Value: RARE WBC PRESENT,BOTH PMN AND MONONUCLEAR     NO ORGANISMS SEEN     Performed at  [facility]     Performed at [facility]   Culture     Final   Value: NO GROWTH 3 DAYS     Performed at [facility]   Report Status 05/28/2013 FINAL   Final  GRAM STAIN     Status: None   Collection Time    05/24/13  6:04 PM      Result Value Range Status   Specimen Description PERITONEAL ABDOMEN   Final   Special Requests PD PORT   Final   Gram Stain     Final   Value: RARE WBC PRESENT,BOTH PMN AND MONONUCLEAR     NO ORGANISMS SEEN   Report Status 05/24/2013 FINAL   Final     Labs: Basic Metabolic Panel:  Recent Labs Lab 05/25/13 0314 05/26/13 0422 05/27/13 1448 05/29/13 1419 05/30/13 0316  NA 134* 141 139 137 137  K 3.6* 3.8 4.1 4.2 3.5*  CL 88* 99 97 95* 94*  CO2 19 26 23 20 29   GLUCOSE 263* 159* 90 138* 212*  BUN 118* 51* 97* 68* 31*  CREATININE 10.65* 5.67* 8.94* 7.49* 3.97*  CALCIUM 5.9* 6.9* 6.7* 7.2* 7.2*  PHOS  --  4.7* 7.4* 5.4*  --    Liver Function Tests:  Recent Labs Lab 05/24/13 1157 05/26/13 0422 05/27/13 1448 05/29/13 1419  AST 26  --   --   --   ALT 21  --   --   --   ALKPHOS 136*  --   --   --   BILITOT 0.4  --   --   --   PROT 5.6*  --   --   --   ALBUMIN 1.7* 1.7* 1.6* 1.6*   No results found for this basename: LIPASE, AMYLASE,  in the last 168 hours No results found for this basename: AMMONIA,  in the last 168  hours CBC:  Recent Labs Lab 05/24/13 1157  05/26/13 0422 05/27/13 0530 05/27/13 1448 05/29/13 1419 05/30/13 0316  WBC 17.7*  < > 12.1* 12.3* 13.8* 7.4 10.8*  NEUTROABS 15.8*  --  10.0*  --   --   --   --   HGB 9.4*  < > 7.6* 7.5* 8.0* 7.6* 8.3*  HCT 27.5*  < > 22.8* 23.1* 24.2* 23.0* 25.5*  MCV 91.1  < > 92.7 96.3 95.7 94.3 97.0  PLT 269  < > 263 299 345 276 310  < > = values in this interval not displayed. Cardiac Enzymes: No results found for this basename: CKTOTAL, CKMB, CKMBINDEX, TROPONINI,  in the last 168 hours BNP: BNP (last 3 results)  Recent Labs  04/02/13 1217  PROBNP 12548.0*   CBG:  Recent Labs Lab 05/29/13 1353 05/29/13 2111 05/30/13 0318 05/30/13 0750 05/30/13 1128  GLUCAP 147* 166* 211* 171* 183*       Signed:  VANN, JESSICA  [facility] 05/30/2013, 1:26 PM

## 2013-05-30 NOTE — Progress Notes (Signed)
[facility] PROGRESS NOTE  Paul [name] [id number] DOB: 1955-06-03 DOA: 05/24/2013 PCP: [name], MD  Assessment/Plan: 58 year old male has a history of diabetes mellitus with complications of neuropathy, hypokalemia, end stage chronic kidney disease on peritoneal dialysis at home daily, who presented to [facility] ED with main concern of several weeks duration of progressively worsening generalized weakness, one episode of fall earlier today prior to this admission, while he was trying to get out of the bed. He explains he fell down and he hit his head but denies LOC. Pt explains he has been having generalized headaches since the fall, throbbing and constant, 3/10 in severity and non radiating no specific alleviating or aggravating factors, no visual changes. Pt currently denies chest pain or shortness of breath, no specific abdominal or urinary concerns, no similar events in the past. He also denies fevers, chills.  In ED, pt hemodynamically stable, electrolyte panel with several metabolic derangement including low K 3.4, sodium 135, Ca 10.2. TR[facility] asked to admit for further evaluation . CT head  negative for acute intracranial abnormalities.   Assessment and Plan:  Generalized weakness with subsequent fall  -  review of records indicate similar events in the past due to low BP  -seen by renal and recommends placing him on HD instead of PD for better volume monitoring and until current issue resolve.  -added midodrine - seen by PT . recommends CIR. Consult placed- awaiting insurance approval   ESRD  - was on peritoneal dialysis  -renal consulted- changed to HD  Diabetes mellitus  - A1C 6.7 - continue SSI   Hypokalemia  - stable  Leukocytosis  - no sign of infection. Will d/c abx  Anemia of chronic disease  - baseline Hg appears to be 8-9 . Likely ACD. Slight drop noted this am. Monitor tomorrow   A-fib  Rate controlled on amiodarone - continue Coumadin per pharmacy   Code  Status: Full  Family Communication: Pt at bedside  Disposition Plan: CIR     HPI/Subjective: Some nausea this AM  Objective: Filed Vitals:   05/30/13 0500  BP: 127/67  Pulse: 66  Temp: 99.3 F (37.4 C)  Resp: 18    Intake/Output Summary (Last 24 hours) at 05/30/13 0931 Last data filed at 05/29/13 1845  Gross per 24 hour  Intake    120 ml  Output   2800 ml  Net  -2680 ml   Filed Weights   05/28/13 2220 05/29/13 1437 05/29/13 1845  Weight: 88.1 kg (194 lb 3.6 oz) 88.2 kg (194 lb 7.1 oz) 85.4 kg (188 lb 4.4 oz)    Exam:   General:  Middle aged thin built male in NAD   chest: clear b/l, no added sounds   CVS: S1&S2 irregular, no MRG  Abd: soft, NT, ND, BS+  Ext: warm, no edema   CNS: AAAOX3  Data Reviewed: Basic Metabolic Panel:  Recent Labs Lab 05/25/13 0314 05/26/13 0422 05/27/13 1448 05/29/13 1419 05/30/13 0316  NA 134* 141 139 137 137  K 3.6* 3.8 4.1 4.2 3.5*  CL 88* 99 97 95* 94*  CO2 19 26 23 20 29   GLUCOSE 263* 159* 90 138* 212*  BUN 118* 51* 97* 68* 31*  CREATININE 10.65* 5.67* 8.94* 7.49* 3.97*  CALCIUM 5.9* 6.9* 6.7* 7.2* 7.2*  PHOS  --  4.7* 7.4* 5.4*  --    Liver Function Tests:  Recent Labs Lab 05/24/13 1157 05/26/13 0422 05/27/13 1448 05/29/13 1419  AST 26  --   --   --  ALT 21  --   --   --   ALKPHOS 136*  --   --   --   BILITOT 0.4  --   --   --   PROT 5.6*  --   --   --   ALBUMIN 1.7* 1.7* 1.6* 1.6*   No results found for this basename: LIPASE, AMYLASE,  in the last 168 hours No results found for this basename: AMMONIA,  in the last 168 hours CBC:  Recent Labs Lab 05/24/13 1157  05/26/13 0422 05/27/13 0530 05/27/13 1448 05/29/13 1419 05/30/13 0316  WBC 17.7*  < > 12.1* 12.3* 13.8* 7.4 10.8*  NEUTROABS 15.8*  --  10.0*  --   --   --   --   HGB 9.4*  < > 7.6* 7.5* 8.0* 7.6* 8.3*  HCT 27.5*  < > 22.8* 23.1* 24.2* 23.0* 25.5*  MCV 91.1  < > 92.7 96.3 95.7 94.3 97.0  PLT 269  < > 263 299 345 276 310  < > =  values in this interval not displayed. Cardiac Enzymes: No results found for this basename: CKTOTAL, CKMB, CKMBINDEX, TROPONINI,  in the last 168 hours BNP (last 3 results)  Recent Labs  04/02/13 1217  PROBNP 12548.0*   CBG:  Recent Labs Lab 05/29/13 1319 05/29/13 1353 05/29/13 2111 05/30/13 0318 05/30/13 0750  GLUCAP 57* 147* 166* 211* 171*    Recent Results (from the past 240 hour(s))  BODY FLUID CULTURE     Status: None   Collection Time    05/24/13  6:04 PM      Result Value Range Status   Specimen Description PERITONEAL ABDOMEN   Final   Special Requests FROM PD PORT   Final   Gram Stain     Final   Value: RARE WBC PRESENT,BOTH PMN AND MONONUCLEAR     NO ORGANISMS SEEN     Performed at [facility]     Performed at [facility]   Culture     Final   Value: NO GROWTH 3 DAYS     Performed at [facility]   Report Status 05/28/2013 FINAL   Final  GRAM STAIN     Status: None   Collection Time    05/24/13  6:04 PM      Result Value Range Status   Specimen Description PERITONEAL ABDOMEN   Final   Special Requests PD PORT   Final   Gram Stain     Final   Value: RARE WBC PRESENT,BOTH PMN AND MONONUCLEAR     NO ORGANISMS SEEN   Report Status 05/24/2013 FINAL   Final     Studies: No results found.  Scheduled Meds: . aspirin EC  81 mg Oral Daily  . atorvastatin  10 mg Oral Daily  . calcium acetate  2,001 mg Oral TID WC  . cholecalciferol  1,000 Units Oral Daily  . cyclobenzaprine  10 mg Oral QHS  . darbepoetin (ARANESP) injection - DIALYSIS  200 mcg Intravenous Q Tue-HD  . doxercalciferol  1 mcg Intravenous Q T,Th,Sa-HD  . feeding supplement (RESOURCE BREEZE)  1 Container Oral TID WC  . ferric gluconate (FERRLECIT/NULECIT) IV  125 mg Intravenous Q T,Th,Sa-HD  . gabapentin  100 mg Oral TID  . insulin aspart  0-15 Units Subcutaneous TID WC  . insulin aspart  0-5 Units Subcutaneous QHS  . insulin glargine  10 Units Subcutaneous Daily  .  midodrine  10 mg Oral TID  WC  . multivitamin  1 tablet Oral QHS  . pantoprazole  40 mg Oral Daily  . potassium chloride  40 mEq Oral Once  . sertraline  100 mg Oral Daily  . sodium chloride  3 mL Intravenous Q12H  . Warfarin - Pharmacist Dosing Inpatient   Does not apply q1800   Continuous Infusions:     Time spent: [place], [facility] Pager [phone] If 7PM-7AM, please contact night-coverage at [email or web address], password [facility] 05/30/2013, 9:31 AM  LOS: 6 days

## 2013-05-30 NOTE — Progress Notes (Signed)
ANTICOAGULATION CONSULT NOTE - Follow-up Consult  Pharmacy Consult:  Coumadin Indication: atrial fibrillation  Allergies  Allergen Reactions  . Metformin And Related Other (See Comments)    Kidney problems    Labs:  Recent Labs  05/27/13 1448 05/28/13 0455 05/29/13 0250 05/29/13 1419 05/30/13 0316  HGB 8.0*  --   --  7.6* 8.3*  HCT 24.2*  --   --  23.0* 25.5*  PLT 345  --   --  276 310  LABPROT  --  32.9* 28.7*  --  24.8*  INR  --  3.37* 2.82*  --  2.33*  CREATININE 8.94*  --   --  7.49* 3.97*    Estimated Creatinine Clearance: 23.3 ml/min (by C-G formula based on Cr of 3.97).  Assessment: 74 YOM with history of Afib to continue on Coumadin from PTA. Patient's INR was therapeutic on admission, but jumped abruptly when continued on home dose. May need to reduce dose or questionable non-compliance at home.   INR today 2.82>2.33 (received 2mg  last night after holding 2 days)  Goal of Therapy:  INR 2-3 Monitor platelets by anticoagulation protocol: Yes   Plan:  INR jumped to 4.36 when continued on home dose -- needs less or non-compliant at home - Coumadin 2mg  x 1 tonight  - F/U daily INR  Cashton Hosley B. Leitha Schuller, PharmD Clinical Pharmacist - Resident Pager: [phone] Phone: [phone] 05/30/2013 2:42 PM

## 2013-05-30 NOTE — Progress Notes (Signed)
Subjective:   No further falls Episode of vomiting after bfast PD cath dsg not changed since admission despite order Needs PD flush today S/p HD yesterday well tolerated  Objective Vital signs in last 24 hours: Filed Vitals:   05/29/13 1830 05/29/13 1845 05/29/13 2109 05/30/13 0500  BP: 106/54 124/71 138/64 127/67  Pulse: 67 70 62 66  Temp:  98.4 F (36.9 C) 98.3 F (36.8 C) 99.3 F (37.4 C)  TempSrc:  Oral Oral Oral  Resp: 22 20 16 18   Height:      Weight:  85.4 kg (188 lb 4.4 oz)    SpO2:  96% 98% 99%   Weight change: 0.1 kg (3.5 oz)  Labs: Basic Metabolic Panel:  Recent Labs Lab 05/26/13 0422 05/27/13 1448 05/29/13 1419 05/30/13 0316  NA 141 139 137 137  K 3.8 4.1 4.2 3.5*  CL 99 97 95* 94*  CO2 26 23 20 29   GLUCOSE 159* 90 138* 212*  BUN 51* 97* 68* 31*  CREATININE 5.67* 8.94* 7.49* 3.97*  CALCIUM 6.9* 6.7* 7.2* 7.2*  PHOS 4.7* 7.4* 5.4*  --    Liver Function Tests:  Recent Labs Lab 05/24/13 1157 05/26/13 0422 05/27/13 1448 05/29/13 1419  AST 26  --   --   --   ALT 21  --   --   --   ALKPHOS 136*  --   --   --   BILITOT 0.4  --   --   --   PROT 5.6*  --   --   --   ALBUMIN 1.7* 1.7* 1.6* 1.6*   No results found for this basename: LIPASE, AMYLASE,  in the last 168 hours No results found for this basename: AMMONIA,  in the last 168 hours CBC:  Recent Labs Lab 05/24/13 1157  05/26/13 0422 05/27/13 0530 05/27/13 1448 05/29/13 1419 05/30/13 0316  WBC 17.7*  < > 12.1* 12.3* 13.8* 7.4 10.8*  NEUTROABS 15.8*  --  10.0*  --   --   --   --   HGB 9.4*  < > 7.6* 7.5* 8.0* 7.6* 8.3*  HCT 27.5*  < > 22.8* 23.1* 24.2* 23.0* 25.5*  MCV 91.1  < > 92.7 96.3 95.7 94.3 97.0  PLT 269  < > 263 299 345 276 310  < > = values in this interval not displayed. Cardiac Enzymes: No results found for this basename: CKTOTAL, CKMB, CKMBINDEX, TROPONINI,  in the last 168 hours CBG:  Recent Labs Lab 05/29/13 1319 05/29/13 1353 05/29/13 2111 05/30/13 0318  05/30/13 0750  GLUCAP 57* 147* 166* 211* 171*   Lab Results  Component Value Date   INR 2.33* 05/30/2013   INR 2.82* 05/29/2013   INR 3.37* 05/28/2013     Studies/Results: No results found. Medications:   . aspirin EC  81 mg Oral Daily  . atorvastatin  10 mg Oral Daily  . calcium acetate  2,001 mg Oral TID WC  . cholecalciferol  1,000 Units Oral Daily  . cyclobenzaprine  10 mg Oral QHS  . darbepoetin (ARANESP) injection - DIALYSIS  200 mcg Intravenous Q Tue-HD  . doxercalciferol  1 mcg Intravenous Q T,Th,Sa-HD  . feeding supplement (RESOURCE BREEZE)  1 Container Oral TID WC  . ferric gluconate (FERRLECIT/NULECIT) IV  125 mg Intravenous Q T,Th,Sa-HD  . gabapentin  100 mg Oral TID  . insulin aspart  0-15 Units Subcutaneous TID WC  . insulin aspart  0-5 Units Subcutaneous QHS  . insulin glargine  10 Units Subcutaneous Daily  . midodrine  10 mg Oral TID WC  . multivitamin  1 tablet Oral QHS  . pantoprazole  40 mg Oral Daily  . sertraline  100 mg Oral Daily  . sodium chloride  3 mL Intravenous Q12H  . Warfarin - Pharmacist Dosing Inpatient   Does not apply q1800   Weight Trending 05/29/13 1845 85.4 kg (188 lb 4.4 oz)   05/29/13 1437 88.2 kg (194 lb 7.1 oz)  Physical Exam  General: Alert, nad,  Flat affect, hasn't shaved yet Heart: RRR , no rub or murmur Lungs: CTA Abdomen: soft NT ND. PD cath in place. Dressing on exit site not occlusive; no drainage but dried crust at exit site No focal abd tenderness  Extremities: No edema Bilat "band" of redness LE's less prominent  Dialysis Access: R FA AVF + bruit/PD catheter  Assessment/Plan:  1.ESRD - FTT on PD; Feels much better with hemo. On TTS schedule; now at [facility]W. .PD cath still in place (he does not want removed - hasn't decided if he would ever go back on PD; certainly will not for a medical standpoint for at least 3 months - so needs dressing changes daily and weekly flushes   (flush today)   2. Generalized weakness/falls -  multi etiologies= uremia, anemia and soft BP's  Low bp has been recent problem despite presence of edema. (low alb contributes to that); midodrine added for BP (TSH and cortisol both normal as outpt and 2D echo 03/2013 normal EF). Pending rehab admit sec to weakness if [employer] approves  3. DM - per primary Insulin has been reduced; may require further reductions since no longer has the glucose load from PD   4 .Leukocytosis - antibiotics were d/c'd. Resolving  5. Anemia -Hgb improving 7.5 x 2> 8.0 no overt bleeding; Aransep IV with HD 200 Aranesp given;  TSAT 33% but total iron only 26 -   Load with 5 doses here;   6. MBD- changed po calcitriol to IV hectorol; continue phoslo; check phos today   7. Malnutrition - supplements (Nepro BID) likes Breeze/ low alb  1.6    8. AF - RRR today, rate controlled; on AMIO and coumadin  - pharm following. INR 2.8 (tight hep HD)   9.Depression - Had been on zoloft 50/day - affect improving/. this admit increased Zoloft to 100/day;   10.HTN/volume - bp 121/63 this am/ on midodrine; attempt volume removal  2 to 3 l as tolerated   11. Neuropathy- Gabapentin 100mg  tid helping   Damani Rando B,MD 05/30/2013 11:53 AM

## 2013-05-31 ENCOUNTER — Inpatient Hospital Stay (HOSPITAL_COMMUNITY)
Admission: RE | Admit: 2013-05-31 | Discharge: 2013-06-11 | DRG: 945 | Disposition: A | Discharge status: Home Health | Payer: Managed Care, Other (non HMO) | Source: Intra-hospital | Attending: Physical Medicine & Rehabilitation | Admitting: Physical Medicine & Rehabilitation

## 2013-05-31 DIAGNOSIS — L97409 Non-pressure chronic ulcer of unspecified heel and midfoot with unspecified severity: Secondary | ICD-10-CM

## 2013-05-31 DIAGNOSIS — R269 Unspecified abnormalities of gait and mobility: Secondary | ICD-10-CM

## 2013-05-31 DIAGNOSIS — K219 Gastro-esophageal reflux disease without esophagitis: Secondary | ICD-10-CM

## 2013-05-31 DIAGNOSIS — R5381 Other malaise: Secondary | ICD-10-CM

## 2013-05-31 DIAGNOSIS — E119 Type 2 diabetes mellitus without complications: Secondary | ICD-10-CM

## 2013-05-31 DIAGNOSIS — D631 Anemia in chronic kidney disease: Secondary | ICD-10-CM

## 2013-05-31 DIAGNOSIS — L97509 Non-pressure chronic ulcer of other part of unspecified foot with unspecified severity: Secondary | ICD-10-CM

## 2013-05-31 DIAGNOSIS — Z8249 Family history of ischemic heart disease and other diseases of the circulatory system: Secondary | ICD-10-CM

## 2013-05-31 DIAGNOSIS — F329 Major depressive disorder, single episode, unspecified: Secondary | ICD-10-CM

## 2013-05-31 DIAGNOSIS — E1142 Type 2 diabetes mellitus with diabetic polyneuropathy: Secondary | ICD-10-CM

## 2013-05-31 DIAGNOSIS — Z7982 Long term (current) use of aspirin: Secondary | ICD-10-CM

## 2013-05-31 DIAGNOSIS — Z7901 Long term (current) use of anticoagulants: Secondary | ICD-10-CM

## 2013-05-31 DIAGNOSIS — N186 End stage renal disease: Secondary | ICD-10-CM

## 2013-05-31 DIAGNOSIS — M1990 Unspecified osteoarthritis, unspecified site: Secondary | ICD-10-CM

## 2013-05-31 DIAGNOSIS — Z8042 Family history of malignant neoplasm of prostate: Secondary | ICD-10-CM

## 2013-05-31 DIAGNOSIS — E1149 Type 2 diabetes mellitus with other diabetic neurological complication: Secondary | ICD-10-CM

## 2013-05-31 DIAGNOSIS — I4891 Unspecified atrial fibrillation: Secondary | ICD-10-CM

## 2013-05-31 DIAGNOSIS — Z79899 Other long term (current) drug therapy: Secondary | ICD-10-CM

## 2013-05-31 DIAGNOSIS — F32A Depression, unspecified: Secondary | ICD-10-CM

## 2013-05-31 DIAGNOSIS — Z8509 Personal history of malignant neoplasm of other digestive organs: Secondary | ICD-10-CM

## 2013-05-31 DIAGNOSIS — Z794 Long term (current) use of insulin: Secondary | ICD-10-CM

## 2013-05-31 DIAGNOSIS — E1129 Type 2 diabetes mellitus with other diabetic kidney complication: Secondary | ICD-10-CM

## 2013-05-31 DIAGNOSIS — Z888 Allergy status to other drugs, medicaments and biological substances status: Secondary | ICD-10-CM

## 2013-05-31 DIAGNOSIS — I120 Hypertensive chronic kidney disease with stage 5 chronic kidney disease or end stage renal disease: Secondary | ICD-10-CM

## 2013-05-31 DIAGNOSIS — N039 Chronic nephritic syndrome with unspecified morphologic changes: Secondary | ICD-10-CM

## 2013-05-31 DIAGNOSIS — E1165 Type 2 diabetes mellitus with hyperglycemia: Secondary | ICD-10-CM

## 2013-05-31 DIAGNOSIS — F3289 Other specified depressive episodes: Secondary | ICD-10-CM

## 2013-05-31 DIAGNOSIS — Z992 Dependence on renal dialysis: Secondary | ICD-10-CM

## 2013-05-31 DIAGNOSIS — N2581 Secondary hyperparathyroidism of renal origin: Secondary | ICD-10-CM

## 2013-05-31 DIAGNOSIS — I951 Orthostatic hypotension: Secondary | ICD-10-CM

## 2013-05-31 DIAGNOSIS — Z823 Family history of stroke: Secondary | ICD-10-CM

## 2013-05-31 DIAGNOSIS — E876 Hypokalemia: Secondary | ICD-10-CM

## 2013-05-31 DIAGNOSIS — Z5189 Encounter for other specified aftercare: Principal | ICD-10-CM

## 2013-05-31 DIAGNOSIS — E785 Hyperlipidemia, unspecified: Secondary | ICD-10-CM

## 2013-05-31 LAB — GLUCOSE, CAPILLARY
GLUCOSE-CAPILLARY: 127 mg/dL — AB (ref 70–99)
GLUCOSE-CAPILLARY: 153 mg/dL — AB (ref 70–99)
GLUCOSE-CAPILLARY: 197 mg/dL — AB (ref 70–99)
GLUCOSE-CAPILLARY: 162 mg/dL — AB (ref 70–99)
GLUCOSE-CAPILLARY: 112 mg/dL — AB (ref 70–99)

## 2013-05-31 LAB — PROTIME-INR
INR: 2.48 — AB (ref 0.00–1.49)
Prothrombin Time: 26 seconds — ABNORMAL HIGH (ref 11.6–15.2)

## 2013-05-31 LAB — RENAL FUNCTION PANEL
ALBUMIN: 1.5 g/dL — AB (ref 3.5–5.2)
BUN: 57 mg/dL — ABNORMAL HIGH (ref 6–23)
CHLORIDE: 93 meq/L — AB (ref 96–112)
CO2: 24 mEq/L (ref 19–32)
Calcium: 7.4 mg/dL — ABNORMAL LOW (ref 8.4–10.5)
Creatinine, Ser: 6.27 mg/dL — ABNORMAL HIGH (ref 0.50–1.35)
GFR calc non Af Amer: 9 mL/min — ABNORMAL LOW (ref >90)
GFR, EST AFRICAN AMERICAN: 10 mL/min — AB (ref >90)
GLUCOSE: 156 mg/dL — AB (ref 70–99)
PHOSPHORUS: 4.1 mg/dL (ref 2.3–4.6)
POTASSIUM: 4.3 meq/L (ref 3.7–5.3)
SODIUM: 133 meq/L — AB (ref 137–147)

## 2013-05-31 LAB — PROCALCITONIN: PROCALCITONIN: 2.92 ng/mL

## 2013-05-31 MED ORDER — WARFARIN SODIUM 1 MG PO TABS
1.0000 mg | ORAL_TABLET | Freq: Once | ORAL | Status: DC
  Filled 2013-05-31: qty 1

## 2013-05-31 MED ORDER — SODIUM CHLORIDE 0.9 % IV SOLN
125.0000 mg | INTRAVENOUS | Status: DC
  Administered 2013-06-01 – 2013-06-10 (×4): 125 mg via INTRAVENOUS
  Filled 2013-05-31 (×9): qty 10

## 2013-05-31 MED ORDER — NEPRO/CARBSTEADY PO LIQD: 237.0000 mL | ORAL | Status: DC | PRN

## 2013-05-31 MED ORDER — ONDANSETRON 8 MG PO TBDP
8.0000 mg | ORAL_TABLET | Freq: Three times a day (TID) | ORAL | Status: DC | PRN
  Filled 2013-05-31: qty 1

## 2013-05-31 MED ORDER — HYDROCODONE-ACETAMINOPHEN 5-325 MG PO TABS: 1.0000 | ORAL_TABLET | ORAL | Status: DC | PRN

## 2013-05-31 MED ORDER — INSULIN ASPART 100 UNIT/ML ~~LOC~~ SOLN
0.0000 [IU] | Freq: Three times a day (TID) | SUBCUTANEOUS | Status: DC
  Administered 2013-05-31 – 2013-06-01 (×2): 3 [IU] via SUBCUTANEOUS
  Administered 2013-06-02: 8 [IU] via SUBCUTANEOUS
  Administered 2013-06-02: 2 [IU] via SUBCUTANEOUS
  Administered 2013-06-03: 3 [IU] via SUBCUTANEOUS
  Administered 2013-06-04: 2 [IU] via SUBCUTANEOUS
  Administered 2013-06-04: 5 [IU] via SUBCUTANEOUS
  Administered 2013-06-05: 2 [IU] via SUBCUTANEOUS
  Administered 2013-06-05 – 2013-06-06 (×2): 3 [IU] via SUBCUTANEOUS
  Administered 2013-06-06: 2 [IU] via SUBCUTANEOUS
  Administered 2013-06-07: 5 [IU] via SUBCUTANEOUS
  Administered 2013-06-07 – 2013-06-08 (×2): 3 [IU] via SUBCUTANEOUS
  Administered 2013-06-08: 2 [IU] via SUBCUTANEOUS
  Administered 2013-06-09: 3 [IU] via SUBCUTANEOUS
  Administered 2013-06-09: 2 [IU] via SUBCUTANEOUS
  Administered 2013-06-10: 3 [IU] via SUBCUTANEOUS
  Administered 2013-06-11: 5 [IU] via SUBCUTANEOUS

## 2013-05-31 MED ORDER — WARFARIN - PHARMACIST DOSING INPATIENT
Freq: Every day | Status: DC
  Administered 2013-05-31 – 2013-06-06 (×3)

## 2013-05-31 MED ORDER — SODIUM CHLORIDE 0.9 % IV SOLN: 125.0000 mg | INTRAVENOUS | Status: DC

## 2013-05-31 MED ORDER — PANTOPRAZOLE SODIUM 40 MG PO TBEC
40.0000 mg | DELAYED_RELEASE_TABLET | Freq: Every day | ORAL | Status: DC
  Administered 2013-06-01 – 2013-06-11 (×11): 40 mg via ORAL
  Filled 2013-05-31 (×11): qty 1

## 2013-05-31 MED ORDER — SERTRALINE HCL 100 MG PO TABS
100.0000 mg | ORAL_TABLET | Freq: Every day | ORAL | Status: DC
  Administered 2013-06-01 – 2013-06-11 (×11): 100 mg via ORAL
  Filled 2013-05-31 (×12): qty 1

## 2013-05-31 MED ORDER — VITAMIN D3 25 MCG (1000 UNIT) PO TABS
1000.0000 [IU] | ORAL_TABLET | Freq: Every day | ORAL | Status: DC
  Administered 2013-06-01 – 2013-06-09 (×9): 1000 [IU] via ORAL
  Filled 2013-05-31 (×10): qty 1

## 2013-05-31 MED ORDER — SORBITOL 70 % SOLN: 30.0000 mL | Freq: Every day | Status: DC | PRN

## 2013-05-31 MED ORDER — PENTAFLUOROPROP-TETRAFLUOROETH EX AERO: 1.0000 "application " | INHALATION_SPRAY | CUTANEOUS | Status: DC | PRN

## 2013-05-31 MED ORDER — LIDOCAINE HCL (PF) 1 % IJ SOLN: 5.0000 mL | INTRAMUSCULAR | Status: DC | PRN

## 2013-05-31 MED ORDER — RENA-VITE PO TABS: 1.0000 | ORAL_TABLET | Freq: Every day | ORAL | Status: DC

## 2013-05-31 MED ORDER — INSULIN GLARGINE 100 UNIT/ML ~~LOC~~ SOLN: 10.0000 [IU] | Freq: Every day | SUBCUTANEOUS | Status: DC

## 2013-05-31 MED ORDER — ASPIRIN EC 81 MG PO TBEC
81.0000 mg | DELAYED_RELEASE_TABLET | Freq: Every day | ORAL | Status: DC
  Administered 2013-06-01 – 2013-06-11 (×11): 81 mg via ORAL
  Filled 2013-05-31 (×12): qty 1

## 2013-05-31 MED ORDER — MENTHOL 3 MG MT LOZG
1.0000 | LOZENGE | OROMUCOSAL | Status: DC | PRN
  Filled 2013-05-31: qty 9

## 2013-05-31 MED ORDER — RENA-VITE PO TABS
1.0000 | ORAL_TABLET | Freq: Every day | ORAL | Status: DC
  Administered 2013-05-31 – 2013-06-04 (×5): 1 via ORAL
  Administered 2013-06-05: 23:00:00 via ORAL
  Administered 2013-06-06 – 2013-06-10 (×5): 1 via ORAL
  Filled 2013-05-31 (×12): qty 1

## 2013-05-31 MED ORDER — MIDODRINE HCL 10 MG PO TABS: 10.0000 mg | ORAL_TABLET | Freq: Three times a day (TID) | ORAL | Status: DC

## 2013-05-31 MED ORDER — INSULIN GLARGINE 100 UNIT/ML ~~LOC~~ SOLN
10.0000 [IU] | Freq: Every day | SUBCUTANEOUS | Status: DC
  Administered 2013-06-01 – 2013-06-06 (×6): 10 [IU] via SUBCUTANEOUS
  Filled 2013-05-31 (×7): qty 0.1

## 2013-05-31 MED ORDER — SODIUM CHLORIDE 0.9 % IV SOLN: 100.0000 mL | INTRAVENOUS | Status: DC | PRN

## 2013-05-31 MED ORDER — GABAPENTIN 100 MG PO CAPS
100.0000 mg | ORAL_CAPSULE | Freq: Three times a day (TID) | ORAL | Status: DC
  Administered 2013-05-31 – 2013-06-03 (×8): 100 mg via ORAL
  Filled 2013-05-31 (×11): qty 1

## 2013-05-31 MED ORDER — MIDODRINE HCL 5 MG PO TABS
10.0000 mg | ORAL_TABLET | Freq: Three times a day (TID) | ORAL | Status: DC
  Administered 2013-05-31: 10 mg via ORAL
  Filled 2013-05-31 (×3): qty 2

## 2013-05-31 MED ORDER — DOXERCALCIFEROL 4 MCG/2ML IV SOLN: 1.0000 ug | INTRAVENOUS | Status: DC

## 2013-05-31 MED ORDER — ACETAMINOPHEN 325 MG PO TABS: 325.0000 mg | ORAL_TABLET | ORAL | Status: DC | PRN

## 2013-05-31 MED ORDER — MIDODRINE HCL 5 MG PO TABS
10.0000 mg | ORAL_TABLET | Freq: Three times a day (TID) | ORAL | Status: DC
  Administered 2013-05-31 – 2013-06-11 (×30): 10 mg via ORAL
  Filled 2013-05-31 (×35): qty 2

## 2013-05-31 MED ORDER — CYCLOBENZAPRINE HCL 10 MG PO TABS
10.0000 mg | ORAL_TABLET | Freq: Every day | ORAL | Status: DC
  Administered 2013-05-31 – 2013-06-10 (×11): 10 mg via ORAL
  Filled 2013-05-31 (×12): qty 1

## 2013-05-31 MED ORDER — CALCIUM ACETATE 667 MG PO CAPS
2001.0000 mg | ORAL_CAPSULE | Freq: Three times a day (TID) | ORAL | Status: DC
  Administered 2013-05-31 – 2013-06-09 (×26): 2001 mg via ORAL
  Filled 2013-05-31 (×29): qty 3

## 2013-05-31 MED ORDER — HEPARIN SODIUM (PORCINE) 1000 UNIT/ML DIALYSIS
1000.0000 [IU] | INTRAMUSCULAR | Status: DC | PRN
Start: 2013-05-31 — End: 2013-06-03

## 2013-05-31 MED ORDER — INSULIN ASPART 100 UNIT/ML ~~LOC~~ SOLN: 0.0000 [IU] | Freq: Every day | SUBCUTANEOUS | Status: DC

## 2013-05-31 MED ORDER — ATORVASTATIN CALCIUM 10 MG PO TABS
10.0000 mg | ORAL_TABLET | Freq: Every day | ORAL | Status: DC
  Administered 2013-06-01 – 2013-06-11 (×11): 10 mg via ORAL
  Filled 2013-05-31 (×12): qty 1

## 2013-05-31 MED ORDER — NEPRO/CARBSTEADY PO LIQD: 237.0000 mL | Freq: Every day | ORAL | Status: DC | PRN

## 2013-05-31 MED ORDER — DARBEPOETIN ALFA-POLYSORBATE 200 MCG/0.4ML IJ SOLN
200.0000 ug | INTRAMUSCULAR | Status: DC
  Administered 2013-06-01 – 2013-06-08 (×2): 200 ug via INTRAVENOUS
  Filled 2013-05-31 (×2): qty 0.4

## 2013-05-31 MED ORDER — WARFARIN SODIUM 1 MG PO TABS
1.0000 mg | ORAL_TABLET | Freq: Once | ORAL | Status: AC
  Administered 2013-05-31: 1 mg via ORAL
  Filled 2013-05-31: qty 1

## 2013-05-31 MED ORDER — DARBEPOETIN ALFA-POLYSORBATE 200 MCG/0.4ML IJ SOLN: 200.0000 ug | INTRAMUSCULAR | Status: DC

## 2013-05-31 MED ORDER — HYDROCODONE-ACETAMINOPHEN 5-325 MG PO TABS
1.0000 | ORAL_TABLET | ORAL | Status: DC | PRN
  Administered 2013-06-01 – 2013-06-04 (×3): 2 via ORAL
  Administered 2013-06-05: 1 via ORAL
  Administered 2013-06-05: 2 via ORAL
  Administered 2013-06-05: 1 via ORAL
  Administered 2013-06-06 – 2013-06-11 (×10): 2 via ORAL
  Filled 2013-05-31 (×11): qty 2
  Filled 2013-05-31 (×2): qty 1
  Filled 2013-05-31 (×3): qty 2

## 2013-05-31 MED ORDER — BOOST / RESOURCE BREEZE PO LIQD: 1.0000 | Freq: Three times a day (TID) | ORAL | Status: DC

## 2013-05-31 MED ORDER — ALTEPLASE 2 MG IJ SOLR: 2.0000 mg | Freq: Once | INTRAMUSCULAR | Status: AC | PRN

## 2013-05-31 MED ORDER — BOOST / RESOURCE BREEZE PO LIQD
1.0000 | Freq: Three times a day (TID) | ORAL | Status: DC
  Administered 2013-06-01 – 2013-06-11 (×28): 1 via ORAL

## 2013-05-31 MED ORDER — HEPARIN SODIUM (PORCINE) 1000 UNIT/ML DIALYSIS: 20.0000 [IU]/kg | INTRAMUSCULAR | Status: DC | PRN

## 2013-05-31 MED ORDER — LIDOCAINE-PRILOCAINE 2.5-2.5 % EX CREA: 1.0000 "application " | TOPICAL_CREAM | CUTANEOUS | Status: DC | PRN

## 2013-05-31 MED ORDER — INSULIN ASPART 100 UNIT/ML ~~LOC~~ SOLN: 0.0000 [IU] | Freq: Three times a day (TID) | SUBCUTANEOUS | Status: DC

## 2013-05-31 MED ORDER — WARFARIN SODIUM 2 MG PO TABS
2.0000 mg | ORAL_TABLET | Freq: Once | ORAL | Status: DC
Start: 2013-05-31 — End: 2013-05-31

## 2013-05-31 NOTE — Progress Notes (Signed)
[facility] Progress Note  Subjective:   No complaints, feels much better  Objective Filed Vitals:   05/30/13 1739 05/30/13 2100 05/31/13 0500 05/31/13 0810  BP: 125/72 127/67 133/71 145/82  Pulse: 63 61 61 57  Temp: 99.7 F (37.6 C) 99.2 F (37.3 C) 98.5 F (36.9 C) 98.3 F (36.8 C)  TempSrc:  Oral Oral Oral  Resp: 18 18 18 18   Height:      Weight:      SpO2: 97% 97% 97% 97%   Physical Exam General: sitting on side of bed working with OT; flat affect Heart: RRR Lungs: no wheezes or rales Abdomen: soft Extremities:  No sig edema Dialysis Access:  Right AVF + bruit (active) plus PD cath  Assessment/Plan: 1.  ESRD - K 4.3 Next HD tomorrow; FTT on PD; s/p HD x 3; HD will be TTS as outpt. Rehab admit sec to weakness.if [employer] approves  2.  Generalized weakness/falls - multi etiologies= uremia, anemia and soft BP's Low bp has been recent problem despite presence of edema. (low alb contributes to that);midodrine added for BP (TSH and cortisol both normal as outpt and 2D echo 03/2013 normal EF). Inpt rehab consult awaiting Insurance okay for admit  3.  DM - per primary Insulin has been reduced; may require further reductions since no longer has the glucose load from PD  4  .Leukocytosis low grade- antibiotics were d/c'd; Procalcitonin ^ at 2.92 5.  Anemia -Hgb improving 7.5 x 2> 8.0>8.4 no overt bleeding; Aransep IV with HD 200 Aranesp given TSAT 33% but total iron only 26 - Load with 5 doses here;  6.  MBD- changed po calcitriol to IV hectorol; continue phoslo; P controlled; d/c po vit D 7.  Malnutrition - supplements (Nepro BID) likes Breeze/ low alb 1.5  8.  AF - RRR today, rate controlled; on AMIO and coumadin - INR in goal tight hep HD 9.  Depression - Had been on zoloft 50/day - affect improving/. this admit increased Zoloft to 100/day 10.HTN/volume - BPs 140s on midodrine - parameters written to hold- need to base on standing BPs; UF 2.8 last tmt 11. Neuropathy-  Gabapentin 100mg  tid helping  Myriam Jacobson, PA-C [place] [phone] 05/31/2013,9:56 AM  LOS: 7 days   I have seen and examined patient, discussed with PA and agree with assessment and plan as outlined above. Kelly Splinter MD pager [phone]    cell [phone] 05/31/2013, 12:30 PM     Additional Objective Labs: Lab Results  Component Value Date   INR 2.33* 05/30/2013   INR 2.82* 05/29/2013   INR 3.37* [id number]   Basic Metabolic Panel:  Recent Labs Lab 05/27/13 1448 05/29/13 1419 05/30/13 0316 05/31/13 0516  NA 139 137 137 133*  K 4.1 4.2 3.5* 4.3  CL 97 95* 94* 93*  CO2 23 20 29 24   GLUCOSE 90 138* 212* 156*  BUN 97* 68* 31* 57*  CREATININE 8.94* 7.49* 3.97* 6.27*  CALCIUM 6.7* 7.2* 7.2* 7.4*  PHOS 7.4* 5.4*  --  4.1   Liver Function Tests:  Recent Labs Lab 05/24/13 1157  05/27/13 1448 05/29/13 1419 05/31/13 0516  AST 26  --   --   --   --   ALT 21  --   --   --   --   ALKPHOS 136*  --   --   --   --   BILITOT 0.4  --   --   --   --  PROT 5.6*  --   --   --   --   ALBUMIN 1.7*  < > 1.6* 1.6* 1.5*  < > = values in this interval not displayed. No results found for this basename: LIPASE, AMYLASE,  in the last 168 hours CBC:  Recent Labs Lab 05/24/13 1157  05/26/13 0422 05/27/13 0530 05/27/13 1448 05/29/13 1419 05/30/13 0316 05/30/13 1715  WBC 17.7*  < > 12.1* 12.3* 13.8* 7.4 10.8* 11.9*  NEUTROABS 15.8*  --  10.0*  --   --   --   --   --   HGB 9.4*  < > 7.6* 7.5* 8.0* 7.6* 8.3* 8.4*  HCT 27.5*  < > 22.8* 23.1* 24.2* 23.0* 25.5* 26.7*  MCV 91.1  < > 92.7 96.3 95.7 94.3 97.0 98.5  PLT 269  < > 263 299 345 276 310 322  < > = values in this interval not displayed. CBG:  Recent Labs Lab 05/30/13 1128 05/30/13 1737 05/30/13 2300 05/31/13 0253 05/31/13 0802  GLUCAP 183* 131* 156* 127* 153*  Medications:   . aspirin EC  81 mg Oral Daily  . atorvastatin  10 mg Oral Daily  . calcium acetate  2,001 mg Oral TID WC  .  cholecalciferol  1,000 Units Oral Daily  . cyclobenzaprine  10 mg Oral QHS  . darbepoetin (ARANESP) injection - DIALYSIS  200 mcg Intravenous Q Tue-HD  . doxercalciferol  1 mcg Intravenous Q T,Th,Sa-HD  . feeding supplement (RESOURCE BREEZE)  1 Container Oral TID WC  . ferric gluconate (FERRLECIT/NULECIT) IV  125 mg Intravenous Q T,Th,Sa-HD  . gabapentin  100 mg Oral TID  . insulin aspart  0-15 Units Subcutaneous TID WC  . insulin aspart  0-5 Units Subcutaneous QHS  . insulin glargine  10 Units Subcutaneous Daily  . midodrine  10 mg Oral TID WC  . multivitamin  1 tablet Oral QHS  . pantoprazole  40 mg Oral Daily  . sertraline  100 mg Oral Daily  . sodium chloride  3 mL Intravenous Q12H  . Warfarin - Pharmacist Dosing Inpatient   Does not apply [phone]

## 2013-05-31 NOTE — Progress Notes (Signed)
Rehab admissions - I have approval from [facility] and we can admit to acute inpatient rehab today.  Bed available and patient/wife are agreeable.  Will admit today to inpatient rehab.  Call me for questions.  RC:[phone]

## 2013-05-31 NOTE — Progress Notes (Signed)
ANTICOAGULATION CONSULT NOTE - Follow-up Consult  Pharmacy Consult:  Coumadin Indication: atrial fibrillation  Allergies  Allergen Reactions  . Metformin And Related Other (See Comments)    Kidney problems    Labs:  Recent Labs  05/29/13 0250  05/29/13 1419 05/30/13 0316 05/30/13 1715 05/31/13 0516  HGB  --   < > 7.6* 8.3* 8.4*  --   HCT  --   --  23.0* 25.5* 26.7*  --   PLT  --   --  276 310 322  --   LABPROT 28.7*  --   --  24.8*  --   --   INR 2.82*  --   --  2.33*  --   --   CREATININE  --   --  7.49* 3.97*  --  6.27*  < > = values in this interval not displayed.  Estimated Creatinine Clearance: 14.7 ml/min (by C-G formula based on Cr of 6.27).  Assessment: CC: generalized weakness  [name] with history of Afib to continue on Coumadin from PTA. Patient's INR is therapeutic 2.14 on his home regimen. No bleeding reported.  PMH: DM complicated with neuropathy, CKD (not eligible for transplant per [facility]), anemia, ESRD on peritoneal dialysis, changed to HD here, HTN, GERD, Depression afib   Anticoagulation: Coumadin for AFib, INR = 2.48 (5mg  daily except 2.5mg  on Wed and Sun PTA). CBC stable   Goal of Therapy:  INR 2-3 Monitor platelets by anticoagulation protocol: Yes   Plan:  Coumadin 1mg  po x 1 tonight.   Paul [name] S. [name], PharmD, [facility] Clinical Staff Pharmacist Pager [phone]  05/31/2013 8:38 AM

## 2013-05-31 NOTE — Progress Notes (Signed)
Flushed PD cath. Tolerated well. Fluid out was clear. Changed dressing.

## 2013-05-31 NOTE — H&P (Signed)
Physical Medicine and Rehabilitation Admission H&P  Chief Complaint   Patient presents with   .  Fall   .  Head Injury   :  Chief complaint: Weakness  HPI: Paul [name] is a 58 y.o. right-handed male with history of atrial fibrillation on chronic Coumadin , end-stage renal disease secondary to diabetes mellitus on peritoneal dialysis at home. Patient was using a cane and walker prior to admission. Admitted 05/24/2013 with progressive weakness over several weeks as well as episode of fall when trying to get bed. Patient denies any head trauma although he had been having generalized headache since the fall. Cranial CT scan negative for any acute abnormalities. CT cervical spine with no fracture or malalignment. Blood pressure on admission 90/60 he did receive fluid bolus. Creatinine on admission of 10.02 and improved to 6.27. Followup renal services with hemodialysis ongoing. Bouts of hypokalemia with supplement added. Physical and occupational therapy evaluations completed 05/26/2013 secondary to weakness deconditioning with recommendations for physical medicine rehabilitation consult to consider inpatient rehabilitation services. Patient was felt to be a good candidate for inpatient rehabilitation services was admitted for a comprehensive rehabilitation program. Of note, pt was eased to the floor over weekend while transferring into the bathroom (while being assisted by staff.)   ROS Review of Systems  Constitutional: Positive for malaise/fatigue.  Respiratory: Positive for shortness of breath.  Cardiovascular: Positive for palpitations.  Gastrointestinal:  GERD  Neurological: Positive for dizziness, tingling and weakness.  Psychiatric/Behavioral: Positive for depression.  All other systems reviewed and are negative  Past Medical History   Diagnosis  Date   .  Diabetic neuropathy    .  Cancer      History of Duodenal Cancer   .  Chronic kidney disease      Pt is not eligible for transplant  per [facility]   .  Diabetes mellitus      Type 2 iddm x 11 yrs   .  Anemia      esrd   .  Hypertension      meds d/c 'd 2 wks ago for postural hypotension   .  Fatigue      tires easily x 2 months   .  Chronic nausea      with morning vomiting; better off HTN meds   .  Intermittent tremor    .  Closed left arm fracture  1967   .  Heart murmur    .  Corneal abrasion, left    .  Shortness of breath      exertional   .  GERD (gastroesophageal reflux disease)    .  History of blood transfusion    .  Lack of coordination    .  Muscle weakness (generalized)    .  Glomerulosclerosis, diabetic    .  Depression    .  A-fib     Past Surgical History   Procedure  Laterality  Date   .  Whipple procedure   2002     duodenal ca   .  Bone marrow biopsy   2012   .  Renal biopsy, percutaneous   2012   .  Tonsillectomy     .  Portacath placement   2002   .  Biliary diversion, external   2002   .  Other surgical history       Cyst removed from back   .  Av fistula placement   06/07/2011     Procedure:  ARTERIOVENOUS (AV) FISTULA CREATION; Surgeon: Angelia Mould, MD; Location: [place]; Service: Vascular; Laterality: Left;   .  Ligation of arteriovenous fistula   05/22/2012     Procedure: LIGATION OF ARTERIOVENOUS FISTULA; Surgeon: Mal Misty, MD; Location: [place]; Service: Vascular; Laterality: Left; Left brachio-cephalic fistula   .  Insertion of dialysis catheter   05/22/2012     Procedure: INSERTION OF DIALYSIS CATHETER; Surgeon: Mal Misty, MD; Location: [place]; Service: Vascular; Laterality: N/A; right internal jugular vein   .  Av fistula placement   06/18/2012     Procedure: ARTERIOVENOUS (AV) FISTULA CREATION; Surgeon: Mal Misty, MD; Location: [place]; Service: Vascular; Laterality: Right; [place]   .  Colonoscopy       Hx: of   .  Capd insertion  N/A  01/14/2013     Procedure: LAPAROSCOPIC INSERTION CONTINUOUS AMBULATORY PERITONEAL DIALYSIS (CAPD) CATHETER; Surgeon: Adin Hector, MD; Location: [place] [facility]; Service: General; Laterality: N/A;    Family History   Problem  Relation  Age of Onset   .  Cancer  Mother    .  Cancer  Father      prostate cancer   .  Heart disease  Maternal Grandfather    .  Stroke  Paternal Grandmother     Social History: reports that he has never smoked. He has never used smokeless tobacco. He reports that he does not drink alcohol or use illicit drugs.  Allergies:  Allergies   Allergen  Reactions   .  Metformin And Related  Other (See Comments)     Kidney problems    Medications Prior to Admission   Medication  Sig  Dispense  Refill   .  aspirin EC 81 MG tablet  Take 81 mg by mouth daily.     [name]  atorvastatin (LIPITOR) 10 MG tablet  Take 10 mg by mouth daily.     .  calcitRIOL (ROCALTROL) 0.25 MCG capsule  Take 0.25 mcg by mouth daily.     .  calcium acetate (PHOSLO) 667 MG capsule  Take 667 mg by mouth 3 (three) times daily with meals.     .  cholecalciferol (VITAMIN D) 1000 UNITS tablet  Take 1,000 Units by mouth daily.     .  cyclobenzaprine (FLEXERIL) 10 MG tablet  Take 10 mg by mouth at bedtime.     [name]  epoetin alfa (EPOGEN,PROCRIT) 51700 UNIT/ML injection  Inject 21,000 Units into the skin See admin instructions. 21,000 units every Monday and Friday     .  gabapentin (NEURONTIN) 100 MG capsule  Take 1 capsule (100 mg total) by mouth 3 (three) times daily.  60 capsule  0   .  insulin glargine (LANTUS) 100 UNIT/ML injection  Inject 20-27 Units into the skin every morning. Per sliding scale. EX) if CBG is 120 then take 20 units, if CBG is 200 then take 27 units     .  metoCLOPramide (REGLAN) 10 MG tablet  Take 1 tablet (10 mg total) by mouth every 6 (six) hours as needed for nausea (nausea/headache).  6 tablet  0   .  omeprazole (PRILOSEC) 40 MG capsule  Take 40 mg by mouth daily.     .  ondansetron (ZOFRAN-ODT) 8 MG disintegrating tablet  Take 8 mg by mouth every 8 (eight) hours as needed for nausea or vomiting.     .  sertraline  (ZOLOFT) 50 MG tablet  Take 50 mg by mouth  daily.     .  warfarin (COUMADIN) 5 MG tablet  Take 2.5-5 mg by mouth See admin instructions. Take 5mg  every day except on Wednesday and Sundays take 2.5mg       Home:  Home Living  Family/patient expects to be discharged to:: Private residence  Living Arrangements: Spouse/significant other  Available Help at Discharge: Family;Available 24 hours/day  Type of Home: House  Home Access: Stairs to enter  [employer] of Steps: 3  Entrance Stairs-Rails: Right;Left  Home Layout: Two level;Able to live on main level with bedroom/bathroom;Full bath on main level  Alternate Level Stairs-Number of Steps: doesnt go upstairs  Home Equipment: Cane - single point;Shower seat;Walker - 2 wheels  Additional Comments: walk-in shower  Functional History:   Functional Status:  Mobility:      ADL:  ADL  Grooming: Performed;Wash/dry hands;Wash/dry face;Min guard  Where Assessed - Grooming: Supported standing  Upper Body Bathing: Simulated;Set up;Supervision/safety  Where Assessed - Upper Body Bathing: Unsupported sitting  Lower Body Bathing: Simulated;Minimal assistance  Where Assessed - Lower Body Bathing: Unsupported sitting;Supported sit to stand  Upper Body Dressing: Performed;Supervision/safety;Set up  Lower Body Dressing: Minimal assistance;Moderate assistance;Performed  Where Assessed - Lower Body Dressing: Unsupported sitting;Supported sit to stand  Toilet Transfer: Minimal assistance;Simulated  Toilet Transfer Method: Sit to stand;Stand pivot  Tub/Shower Transfer Method: Not assessed  Transfers/Ambulation Related to ADLs: pt requested to return to bed after toilet transfer simulated  ADL Comments: min - mod A due to fatigue and dizziness, BP can drop  Cognition:  Cognition  Overall Cognitive Status: Within Functional Limits for tasks assessed  Orientation Level: Oriented X4  Cognition  Arousal/Alertness: Awake/alert  Behavior During  Therapy: WFL for tasks assessed/performed  Overall Cognitive Status: Within Functional Limits for tasks assessed     Physical Exam:  Blood pressure 121/65, pulse 65, temperature 98.4 F (36.9 C), temperature source Oral, resp. rate 18, height 6' 2.02" (1.88 m), weight 86.183 kg (190 lb), SpO2 96.00%.    Constitutional: He is oriented to person, place, and time.   NAD HENT: oral mucosa pink and moist Head: Normocephalic.  Eyes: EOM are normal.  Neck: Normal range of motion. Neck supple. No thyromegaly present.  Cardiovascular:  Cardiac rate controlled. No murmur. No rubs, gallops Respiratory: Effort normal and breath sounds normal. No respiratory distress. No wheezes or rales GI: Soft. Bowel sounds are normal. He exhibits no distension. Bowel sounds positive Musculoskeletal:  both ankles tender with palpation and PROM. Residual bruising note around ankles Neurological: He is alert and oriented to person, place, and time. Displays normal memory, decision making capacity, insight, awareness, and memory.  He displays normal reflexes. No cranial nerve deficit.  Followed commands. Flat affect. Stocking glove sensory deficits from mid calf down bilateral LE's. No sensory loss in hands. UE's grossly 4+ in deltoid, bicep, tricep, wrist and HI. [name] LE's   3- with HF, 3 KE and 4- at ankles. No abnormalities in tone. Reflexes 1+.  Skin: Skin is warm and dry without breakdown. Psych: pt is flat but generally pleasant and appropriate.    Results for orders placed during the hospital encounter of 05/24/13 (from the past 48 hour(s))   GLUCOSE, CAPILLARY Status: Abnormal    Collection Time    05/25/13 1:09 PM   Result  Value  Range    Glucose-Capillary  124 (*)  70 - 99 mg/dL   HEPATITIS B SURFACE ANTIGEN Status: None    Collection Time    05/25/13 3:11  PM   Result  Value  Range    Hepatitis B Surface Ag  NEGATIVE  NEGATIVE    Comment:  Performed at [place], TOTAL Status: None    Collection Time    05/25/13 3:11 PM   Result  Value  Range    Hep B Core Total Ab  NON REACTIVE  NON REACTIVE    Comment:  Performed at [place] ANTIBODY Status: Abnormal    Collection Time    05/25/13 3:11 PM   Result  Value  Range    Hep B S Ab  POSITIVE (*)  NEGATIVE    Comment:  Performed at [facility], CAPILLARY Status: Abnormal    Collection Time    05/25/13 8:49 PM   Result  Value  Range    Glucose-Capillary  136 (*)  70 - 99 mg/dL   GLUCOSE, CAPILLARY Status: Abnormal    Collection Time    05/26/13 2:42 AM   Result  Value  Range    Glucose-Capillary  192 (*)  70 - 99 mg/dL   PROTIME-INR Status: Abnormal    Collection Time    05/26/13 4:22 AM   Result  Value  Range    Prothrombin Time  30.2 (*)  11.6 - 15.2 seconds    INR  3.02 (*)  0.00 - 1.49   RENAL FUNCTION PANEL Status: Abnormal    Collection Time    05/26/13 4:22 AM   Result  Value  Range    Sodium  141  137 - 147 mEq/L    Comment:  DELTA CHECK NOTED    Potassium  3.8  3.7 - 5.3 mEq/L    Chloride  99  96 - 112 mEq/L    Comment:  DELTA CHECK NOTED    CO2  26  19 - 32 mEq/L    Glucose, Bld  159 (*)  70 - 99 mg/dL    BUN  51 (*)  6 - 23 mg/dL    Comment:  DELTA CHECK NOTED    Creatinine, Ser  5.67 (*)  0.50 - 1.35 mg/dL    Comment:  DELTA CHECK NOTED    Calcium  6.9 (*)  8.4 - 10.5 mg/dL    Phosphorus  4.7 (*)  2.3 - 4.6 mg/dL    Albumin  1.7 (*)  3.5 - 5.2 g/dL    GFR calc non Af Amer  10 (*)  >90 mL/min    GFR calc Af Amer  11 (*)  >90 mL/min    Comment:  (NOTE)     The eGFR has been calculated using the CKD EPI equation.     This calculation has not been validated in all clinical situations.     eGFR's persistently <90 mL/min signify possible Chronic Kidney     Disease.   CBC Status: Abnormal    Collection Time    05/26/13 4:22 AM   Result  Value  Range    WBC  12.1 (*)  4.0 - 10.5 K/uL    RBC  2.46 (*)  4.22 -  5.81 MIL/uL    Hemoglobin  7.6 (*)  13.0 - 17.0 g/dL    HCT  22.8 (*)  39.0 - 52.0 %    MCV  92.7  78.0 - 100.0 fL    MCH  30.9  26.0 - 34.0 pg    MCHC  33.3  30.0 - 36.0 g/dL    RDW  14.9  11.5 - 15.5 %    Platelets  263  150 - 400 K/uL   DIFFERENTIAL Status: Abnormal    Collection Time    05/26/13 4:22 AM   Result  Value  Range    Neutrophils Relative %  82 (*)  43 - 77 %    Neutro Abs  10.0 (*)  1.7 - 7.7 K/uL    Lymphocytes Relative  9 (*)  12 - 46 %    Lymphs Abs  1.1  0.7 - 4.0 K/uL    Monocytes Relative  8  3 - 12 %    Monocytes Absolute  1.0  0.1 - 1.0 K/uL    Eosinophils Relative  1  0 - 5 %    Eosinophils Absolute  0.1  0.0 - 0.7 K/uL    Basophils Relative  0  0 - 1 %    Basophils Absolute  0.0  0.0 - 0.1 K/uL   GLUCOSE, CAPILLARY Status: Abnormal    Collection Time    05/26/13 7:30 AM   Result  Value  Range    Glucose-Capillary  138 (*)  70 - 99 mg/dL   GLUCOSE, CAPILLARY Status: Abnormal    Collection Time    05/26/13 12:05 PM   Result  Value  Range    Glucose-Capillary  130 (*)  70 - 99 mg/dL   GLUCOSE, CAPILLARY Status: None    Collection Time    05/26/13 4:54 PM   Result  Value  Range    Glucose-Capillary  84  70 - 99 mg/dL   GLUCOSE, CAPILLARY Status: Abnormal    Collection Time    05/26/13 10:11 PM   Result  Value  Range    Glucose-Capillary  152 (*)  70 - 99 mg/dL   GLUCOSE, CAPILLARY Status: Abnormal    Collection Time    05/27/13 3:22 AM   Result  Value  Range    Glucose-Capillary  128 (*)  70 - 99 mg/dL   PROTIME-INR Status: Abnormal    Collection Time    05/27/13 5:30 AM   Result  Value  Range    Prothrombin Time  40.0 (*)  11.6 - 15.2 seconds    INR  4.36 (*)  0.00 - 1.49   CBC Status: Abnormal    Collection Time    05/27/13 5:30 AM   Result  Value  Range    WBC  12.3 (*)  4.0 - 10.5 K/uL    RBC  2.40 (*)  4.22 - 5.81 MIL/uL    Hemoglobin  7.5 (*)  13.0 - 17.0 g/dL    HCT  23.1 (*)  39.0 - 52.0 %    MCV  96.3  78.0 - 100.0 fL    MCH   31.3  26.0 - 34.0 pg    MCHC  32.5  30.0 - 36.0 g/dL    RDW  15.2  11.5 - 15.5 %    Platelets  299  150 - 400 K/uL   GLUCOSE, CAPILLARY Status: Abnormal    Collection Time    05/27/13 8:01 AM   Result  Value  Range    Glucose-Capillary  62 (*)  70 - 99 mg/dL   GLUCOSE, CAPILLARY Status: None    Collection Time    05/27/13 12:02 PM   Result  Value  Range    Glucose-Capillary  74  70 - 99 mg/dL  No results found.  Post Admission Physician Evaluation:  1. Functional deficits secondary to deconditioning, multi-factorial gait disorder related diabetic peripheral neuropathy, endstage renal disease, orthostatic hypotension and anemia. 2. Patient is admitted to receive collaborative, interdisciplinary care between the physiatrist, rehab nursing staff, and therapy team. 3. Patient's level of medical complexity and substantial therapy needs in context of that medical necessity cannot be provided at a lesser intensity of care such as a SNF. 4. Patient has experienced substantial functional loss from his/her baseline which was documented above under the "Functional History" and "Functional Status" headings. Judging by the patient's diagnosis, physical exam, and functional history, the patient has potential for functional progress which will result in measurable gains while on inpatient rehab. These gains will be of substantial and practical use upon discharge in facilitating mobility and self-care at the household level. 5. Physiatrist will provide 24 hour management of medical needs as well as oversight of the therapy plan/treatment and provide guidance as appropriate regarding the interaction of the two. 6. 24 hour rehab nursing will assist with bladder management, bowel management, safety, skin/wound care, disease management, medication administration, pain management and patient education and help integrate therapy concepts, techniques,education, etc. 7. PT will assess and treat for/with: Lower  extremity strength, range of motion, stamina, balance, functional mobility, safety, adaptive techniques and equipment, NMR,cardiovascular tolerance, pain control. Goals are: mod I. 8. OT will assess and treat for/with: ADL's, functional mobility, safety, upper extremity strength, adaptive techniques and equipment, NMR, cardiovascular tolerance, pain. Goals are: mod I. 9. SLP will assess and treat for/with: n/a. Goals are: n/a. 10. Case Management and Social Worker will assess and treat for psychological issues and discharge planning. 11. Team conference will be held weekly to assess progress toward goals and to determine barriers to discharge. 12. Patient will receive at least 3 hours of therapy per day at least 5 days per week. 13. ELOS: 7-9 days  14. Prognosis: excellent   Medical Problem List and Plan:  1. Multifactorial gait disorder related to diabetes mellitus peripheral neuropathy, osteoarthritis, orthostasis and multi-medical  2. DVT Prophylaxis/Anticoagulation: Chronic Coumadin therapy for atrial fibrillation. Monitor for any bleeding episodes. Latest INR 2. 48  3. Pain Management: Flexeril 10 mg each bedtime, Neurontin 100 mg 3 times a day, hydrocodone as needed. Monitor with increased mobility  4. Mood/depression. Zoloft 100 mg daily. Provide emotional support  5. Neuropsych: This patient is capable of making decisions on his own behalf.  6. End-stage renal disease. Continue hemodialysis as per renal services. Follow closely for activity tolerance as it pertains to volume status, effects of HD, etc 7. Diabetes mellitus with peripheral neuropathy. Hemoglobin A1c 6.7. NovoLog 4 units 3 times a day, Lantus insulin 15 units each bedtime. Check CBGs a.c. and at bedtime  8. Orthostasis. Midodrine 10 mg 3 times a day. Monitor and adjust regimen as indicated based daily analysis of cardiovascular parameters.   -encourage adequate fluid intake  -TEDS, abd binder as needed.  9. Hyperlipidemia.  Lipitor  10. Chronic anemia. Aranesp. Followup CBC with dialysis  11. GERD. Protonix   Meredith Staggers, MD, [place] [facility]   05/27/2013

## 2013-05-31 NOTE — Progress Notes (Signed)
NUTRITION FOLLOW-UP/CONSULT  DOCUMENTATION CODES Per approved criteria  -Not Applicable   INTERVENTION: Continue Resource Breeze TID. Provide Nepro PRN for any missed meals with HD. RD provided Renal diet education during this visit. RD to continue to follow nutrition care plan.  NUTRITION DIAGNOSIS: Increased nutrient needs related to hemodialysis as evidenced by estimated energy needs. Ongoing.   Goal: Pt to meet >/= 90% of their estimated nutrition needs - improved.  Monitor:  PO intake Weight Labs  ASSESSMENT: 58 year old male has a history of diabetes mellitus with complications of neuropathy, hypokalemia, end stage chronic kidney disease on peritoneal dialysis at home daily, who presented to [facility] ED with main concern of several weeks duration of progressively worsening generalized weakness, one episode of fall earlier today prior to this admission, while he was trying to get out of the bed.   Pt has had several hypoglycemic events on 1/10. Pt is now eating 75 - 100% of meals. Drinking Resource Breeze TID, as scheduled, per [employer]. Pt reports that he enjoys this supplement and would like to continue to receive. RD saw pt's lunch tray, pt consumed >75% of meal.  RD received consult for diet education. Pt with hx of PD, was knowledgeable of several of the diet restrictions needed. Provided Choose-A-Meal Booklet to patient. Reviewed food groups and provided written recommended serving sizes specifically determined for patient's current nutritional status.   Explained why diet restrictions are needed and provided lists of foods to limit/avoid that are high potassium, sodium, and phosphorus. Provided specific recommendations on safer alternatives of these foods. Strongly encouraged compliance of this diet.   Discussed importance of protein intake at each meal and snack. Provided examples of how to maximize protein intake throughout the day. Discussed need for fluid restriction with  dialysis, importance of minimizing weight gain between HD treatments, and renal-friendly beverage options.  Encouraged pt to discuss specific diet questions/concerns with RD at HD outpatient facility. Teach back method used.  Expect good compliance.  Noted potassium and phosphorus labs are now WNL.  Height: Ht Readings from Last 1 Encounters:  05/24/13 6' 2.02" (1.88 m)    Weight: Wt Readings from Last 1 Encounters:  05/29/13 188 lb 4.4 oz (85.4 kg)  This is patient's post-HD weight on 1/10 Admit wt was 189 lb - stable  BMI:  Body mass index is 24.16 kg/(m^2). Normal weight  Estimated Nutritional Needs: Kcal: 2200-2500 Protein: >/= 100 grams daily Fluid: 1.2 L/day  Skin: intact; no significant edema  Diet Order: Renal 80-90; 1200 ml fluid restriction    Intake/Output Summary (Last 24 hours) at 05/31/13 1319 Last data filed at 05/30/13 1347  Gross per 24 hour  Intake    200 ml  Output      0 ml  Net    200 ml    Last BM: 1/11  Labs:   Recent Labs Lab 05/27/13 1448 05/29/13 1419 05/30/13 0316 05/31/13 0516  NA 139 137 137 133*  K 4.1 4.2 3.5* 4.3  CL 97 95* 94* 93*  CO2 23 20 29 24   BUN 97* 68* 31* 57*  CREATININE 8.94* 7.49* 3.97* 6.27*  CALCIUM 6.7* 7.2* 7.2* 7.4*  PHOS 7.4* 5.4*  --  4.1  GLUCOSE 90 138* 212* 156*    CBG (last 3)   Recent Labs  05/31/13 0253 05/31/13 0802 05/31/13 1127  GLUCAP 127* 153* 197*    Scheduled Meds: . aspirin EC  81 mg Oral Daily  . atorvastatin  10 mg Oral Daily  .  calcium acetate  2,001 mg Oral TID WC  . cyclobenzaprine  10 mg Oral QHS  . darbepoetin (ARANESP) injection - DIALYSIS  200 mcg Intravenous Q Tue-HD  . doxercalciferol  1 mcg Intravenous Q T,Th,Sa-HD  . feeding supplement (RESOURCE BREEZE)  1 Container Oral TID WC  . ferric gluconate (FERRLECIT/NULECIT) IV  125 mg Intravenous Q T,Th,Sa-HD  . gabapentin  100 mg Oral TID  . insulin aspart  0-15 Units Subcutaneous TID WC  . insulin aspart  0-5  Units Subcutaneous QHS  . insulin glargine  10 Units Subcutaneous Daily  . midodrine  10 mg Oral TID WC  . multivitamin  1 tablet Oral QHS  . pantoprazole  40 mg Oral Daily  . sertraline  100 mg Oral Daily  . sodium chloride  3 mL Intravenous Q12H  . warfarin  1 mg Oral ONCE-1800  . Warfarin - Pharmacist Dosing Inpatient   Does not apply q1800    Continuous Infusions:   Inda Coke MS, RD, LDN Pager: [phone] After-hours pager: [phone]

## 2013-05-31 NOTE — Progress Notes (Signed)
Received Pt. As a transfer from  [facility] [address].Pt. Is alert and oriented,from home with wife.No equipment from home at the bedside.Pt.'s skin with out pressure ulcers.Keep monitoring pt. Closely and assessing his needs.Pt. Was oriented to the rehab unit,safety plan was sing.

## 2013-05-31 NOTE — Discharge Instructions (Signed)
Information on my medicine - Coumadin   (Warfarin)  This medication education was reviewed with me or my healthcare representative as part of my discharge preparation.  The pharmacist that spoke with me during my hospital stay was:  Wayland Salinas, [facility]  Why was Coumadin prescribed for you? Coumadin was prescribed for you because you have a blood clot or a medical condition that can cause an increased risk of forming blood clots. Blood clots can cause serious health problems by blocking the flow of blood to the heart, lung, or brain. Coumadin can prevent harmful blood clots from forming. As a reminder your indication for Coumadin is:   Select from menu  What test will check on my response to Coumadin? While on Coumadin (warfarin) you will need to have an INR test regularly to ensure that your dose is keeping you in the desired range. The INR (international normalized ratio) number is calculated from the result of the laboratory test called prothrombin time (PT).  If an INR APPOINTMENT HAS NOT ALREADY BEEN MADE FOR YOU please schedule an appointment to have this lab work done by your health care provider within 7 days. Your INR goal is usually a number between:  2 to 3 or your provider may give you a more narrow range like 2-2.5.  Ask your health care provider during an office visit what your goal INR is.  What  do you need to  know  About  COUMADIN? Take Coumadin (warfarin) exactly as prescribed by your healthcare provider about the same time each day.  DO NOT stop taking without talking to the doctor who prescribed the medication.  Stopping without other blood clot prevention medication to take the place of Coumadin may increase your risk of developing a new clot or stroke.  Get refills before you run out.  What do you do if you miss a dose? If you miss a dose, take it as soon as you remember on the same day then continue your regularly scheduled regimen the next day.  Do not take  two doses of Coumadin at the same time.  Important Safety Information A possible side effect of Coumadin (Warfarin) is an increased risk of bleeding. You should call your healthcare provider right away if you experience any of the following:   Bleeding from an injury or your nose that does not stop.   Unusual colored urine (red or dark brown) or unusual colored stools (red or black).   Unusual bruising for unknown reasons.   A serious fall or if you hit your head (even if there is no bleeding).  Some foods or medicines interact with Coumadin (warfarin) and might alter your response to warfarin. To help avoid this:   Eat a balanced diet, maintaining a consistent amount of Vitamin K.   Notify your provider about major diet changes you plan to make.   Avoid alcohol or limit your intake to 1 drink for women and 2 drinks for men per day. (1 drink is 5 oz. wine, 12 oz. beer, or 1.5 oz. liquor.)  Make sure that ANY health care provider who prescribes medication for you knows that you are taking Coumadin (warfarin).  Also make sure the healthcare provider who is monitoring your Coumadin knows when you have started a new medication including herbals and non-prescription products.  Coumadin (Warfarin)  Major Drug Interactions  Increased Warfarin Effect Decreased Warfarin Effect  Alcohol (large quantities) Antibiotics (esp. Septra/Bactrim, Flagyl, Cipro) Amiodarone (Cordarone) Aspirin (ASA) Cimetidine (Tagamet)  Megestrol (Megace) °NSAIDs (ibuprofen, naproxen, etc.) °Piroxicam (Feldene) °Propafenone (Rythmol SR) °Propranolol (Inderal) °Isoniazid (INH) °Posaconazole (Noxafil) Barbiturates (Phenobarbital) °Carbamazepine (Tegretol) °Chlordiazepoxide (Librium) °Cholestyramine (Questran) °Griseofulvin °Oral Contraceptives °Rifampin °Sucralfate (Carafate) °Vitamin K  ° °Coumadin® (Warfarin) Major Herbal Interactions  °Increased Warfarin Effect Decreased Warfarin Effect  °Garlic °Ginseng °Ginkgo biloba  Coenzyme Q10 °Green tea °St. John’s wort   ° °Coumadin® (Warfarin) FOOD Interactions  °Eat a consistent number of servings per week of foods HIGH in Vitamin K °(1 serving = ½ cup)  °Collards (cooked, or boiled & drained) °Kale (cooked, or boiled & drained) °Mustard greens (cooked, or boiled & drained) °Parsley *serving size only = ¼ cup °Spinach (cooked, or boiled & drained) °Swiss chard (cooked, or boiled & drained) °Turnip greens (cooked, or boiled & drained)  °Eat a consistent number of servings per week of foods MEDIUM-HIGH in Vitamin K °(1 serving = 1 cup)  °Asparagus (cooked, or boiled & drained) °Broccoli (cooked, boiled & drained, or raw & chopped) °Brussel sprouts (cooked, or boiled & drained) *serving size only = ½ cup °Lettuce, raw (green leaf, endive, romaine) °Spinach, raw °Turnip greens, raw & chopped  ° °These websites have more information on Coumadin (warfarin):  www.coumadin.com; °www.ahrq.gov/consumer/coumadin.htm; ° ° °

## 2013-05-31 NOTE — PMR Pre-admission (Signed)
PMR Admission Coordinator Pre-Admission Assessment  Patient: Paul [name] is an 58 y.o., male MRN: [id number] DOB: 1955-11-19 Height: 6' 2.02" (188 cm) Weight: 85.4 kg (188 lb 4.4 oz)              Insurance Information HMO:     PPO:      PCP:      IPA:      80/20:      OTHER: Open Access  PRIMARY: [employer] Managed      Policy#: [id number]      Subscriber: self CM Name: Deatra James      Phone#: [phone], ext [phone]     Fax#: [phone] Pre-Cert#: [id number] x 7 days, with update due on 06-07-13     Employer: full time Benefits:  Phone #: [phone]      Name: Margarita Mail. Date: 05-20-05     Deduct: $500      Out of Pocket Max: $2500      Life Max: none CIR: semi-private room 90%/10%     SNF: $0/90%/10%  120 days/yr Outpatient:       Co-Pay: $40 copay/60 visits Home Health: 100%      Co-Pay: 40 days DME: Ded + 100%     Co-Pay: none  Providers:  In network  Emergency [place]   Name Relation Home Work [place] Spouse [phone]  [phone]     Current Medical History  Patient Admitting Diagnosis: multifactorial gait disorder likely related to diabetic peripheral neuropathy  History of Present Illness: Paul [name] is a 58 y.o. right-handed male with history of atrial fibrillation on chronic Coumadin , end-stage renal disease secondary to diabetes mellitus on peritoneal dialysis at home. Patient was using a cane and walker prior to admission. Admitted 05/24/2013 with progressive weakness over several weeks as well as episode of fall when trying to get bed. Patient denies any head trauma although he had been having generalized headache since the fall. Cranial CT scan negative for any acute abnormalities. CT cervical spine with no fracture or malalignment. Blood pressure on admission 90/60 he did receive fluid bolus. Creatinine on admission of 10.02. Followup renal services with hemodialysis ongoing. Bouts of hypokalemia with supplement added.  Physical and occupational therapy evaluations completed 05/26/2013 secondary to weakness deconditioning with recommendations for physical medicine rehabilitation consult to consider inpatient rehabilitation services.    Past Medical History  Past Medical History  Diagnosis Date  . Diabetic neuropathy   . Cancer     History of Duodenal Cancer  . Chronic kidney disease     Pt is not eligible for transplant per [facility]  . Diabetes mellitus     Type 2 iddm x 11 yrs  . Anemia     esrd  . Hypertension     meds d/c 'd  2 wks ago for postural hypotension  . Fatigue      tires easily x 2 months  . Chronic nausea     with  morning vomiting; better off  HTN meds  . Intermittent tremor   . Closed left arm fracture 1967  . Heart murmur   . Corneal abrasion, left   . Shortness of breath     exertional  . GERD (gastroesophageal reflux disease)   . History of blood transfusion   . Lack of coordination   . Muscle weakness (generalized)   . Glomerulosclerosis, diabetic   . Depression   . A-fib     Family History  family history includes Cancer in his father and mother; Heart disease in his maternal grandfather; Stroke in his paternal grandmother.  Prior Rehab/Hospitalizations: previous outpatient PT for shoulder    Current Medications  Current facility-administered medications:0.9 %  sodium chloride infusion, 250 mL, Intravenous, PRN, Theodis Blaze, MD;  aspirin EC tablet 81 mg, 81 mg, Oral, Daily, Theodis Blaze, MD, 81 mg at 05/31/13 1043;  atorvastatin (LIPITOR) tablet 10 mg, 10 mg, Oral, Daily, Theodis Blaze, MD, 10 mg at 05/31/13 1043;  calcium acetate (PHOSLO) capsule 2,001 mg, 2,001 mg, Oral, TID WC, Lucrezia Starch, MD, 2,001 mg at 05/31/13 [id number] cyclobenzaprine (FLEXERIL) tablet 10 mg, 10 mg, Oral, QHS, Theodis Blaze, MD, 10 mg at 05/30/13 2304;  darbepoetin (ARANESP) injection 200 mcg, 200 mcg, Intravenous, Q Tue-HD, Lucrezia Starch, MD, 200 mcg at 05/25/13 1635;  dextrose (GLUTOSE)  40 % oral gel 37.5 g, 1 Tube, Oral, PRN, Jessica U Vann, DO, 37.5 g at 05/29/13 1125;  doxercalciferol (HECTOROL) injection 1 mcg, 1 mcg, Intravenous, Q T,Th,Sa-HD, Lucrezia Starch, MD, 1 mcg at 05/27/13 1707 feeding supplement (RESOURCE BREEZE) (RESOURCE BREEZE) liquid 1 Container, 1 Container, Oral, TID WC, Foye Clock, PA-C, 1 Container at 05/31/13 [phone];  ferric gluconate (NULECIT) 125 mg in sodium chloride 0.9 % 100 mL IVPB, 125 mg, Intravenous, Q T,Th,Sa-HD, Lucrezia Starch, MD, 125 mg at 05/29/13 1730;  gabapentin (NEURONTIN) capsule 100 mg, 100 mg, Oral, TID, Theodis Blaze, MD, 100 mg at 05/31/13 1043 HYDROcodone-acetaminophen (NORCO/VICODIN) 5-325 MG per tablet 1-2 tablet, 1-2 tablet, Oral, Q4H PRN, Theodis Blaze, MD, 2 tablet at 05/27/13 2013;  insulin aspart (novoLOG) injection 0-15 Units, 0-15 Units, Subcutaneous, TID WC, Theodis Blaze, MD, 3 Units at 05/31/13 0834;  insulin aspart (novoLOG) injection 0-5 Units, 0-5 Units, Subcutaneous, QHS, Theodis Blaze, MD insulin glargine (LANTUS) injection 10 Units, 10 Units, Subcutaneous, Daily, Geradine Girt, DO, 10 Units at 05/31/13 1049;  menthol-cetylpyridinium (CEPACOL) lozenge 3 mg, 1 lozenge, Oral, PRN, Geradine Girt, DO, 3 mg at 05/28/13 1829;  metoCLOPramide (REGLAN) tablet 10 mg, 10 mg, Oral, Q6H PRN, Theodis Blaze, MD;  midodrine (PROAMATINE) tablet 10 mg, 10 mg, Oral, TID WC, Myriam Jacobson, PA-C multivitamin (RENA-VIT) tablet 1 tablet, 1 tablet, Oral, QHS, Lucrezia Starch, MD, 1 tablet at 05/30/13 2304;  ondansetron (ZOFRAN-ODT) disintegrating tablet 8 mg, 8 mg, Oral, Q8H PRN, Theodis Blaze, MD, 8 mg at 05/25/13 2222;  pantoprazole (PROTONIX) EC tablet 40 mg, 40 mg, Oral, Daily, Theodis Blaze, MD, 40 mg at 05/31/13 1043;  phenol (CHLORASEPTIC) mouth spray 1 spray, 1 spray, Mouth/Throat, PRN, Geradine Girt, DO, 1 spray at 05/28/13 1352 promethazine (PHENERGAN) tablet 12.5 mg, 12.5 mg, Oral, Q6H PRN, Theodis Blaze, MD, 12.5 mg at  05/30/13 0847;  sertraline (ZOLOFT) tablet 100 mg, 100 mg, Oral, Daily, Lucrezia Starch, MD, 100 mg at 05/31/13 1044;  sodium chloride 0.9 % injection 3 mL, 3 mL, Intravenous, Q12H, Theodis Blaze, MD, 3 mL at 05/30/13 1014;  sodium chloride 0.9 % injection 3 mL, 3 mL, Intravenous, PRN, Theodis Blaze, MD warfarin (COUMADIN) tablet 1 mg, 1 mg, Oral, ONCE-1800, Crystal Avanell Shackleton, [facility];  Warfarin - Pharmacist Dosing Inpatient, , Does not apply, q1800, Saundra Shelling, RPH  Patients Current Diet: Renal  Precautions / Restrictions Precautions Precautions: Fall Precaution Comments: reports 5 falls in 6 months (most in past couple of weeks)  Note: pt did have  a fall on 05-29-13 while on 6E. Restrictions Weight Bearing Restrictions: No   Prior Activity Level Household:  (was homebound for the 1-2 weeks leading to admit ) Community (5-7x/wk):  (was working full time until mid Dec.)  Development worker, international aid / [place] Devices/Equipment: CBG Meter Home Equipment: Cane - single point;Shower seat;Walker - 2 wheels  Prior Functional Level Prior Function Level of Independence: Needs assistance Gait / Transfers Assistance Needed: someone stands next to him on stairs, was using cane up unitl past 2-3 weeks started using walker and fell several times due to legs giving away  Current Functional Level Cognition  Overall Cognitive Status: Within Functional Limits for tasks assessed Orientation Level: Oriented X4    Extremity Assessment (includes Sensation/Coordination)          ADLs  Grooming: Performed;Wash/dry hands;Wash/dry face;Min guard Where Assessed - Grooming: Supported standing Upper Body Bathing: Simulated;Set up;Supervision/safety Where Assessed - Upper Body Bathing: Unsupported sitting Lower Body Bathing: Simulated;Minimal assistance Where Assessed - Lower Body Bathing: Unsupported sitting;Supported sit to stand Upper Body Dressing:  Performed;Supervision/safety;Set up Lower Body Dressing: Minimal assistance;Moderate assistance;Performed Where Assessed - Lower Body Dressing: Unsupported sitting;Supported sit to stand Toilet Transfer: Minimal assistance;Simulated Toilet Transfer Method: Sit to stand;Stand pivot Toileting - Clothing Manipulation and Hygiene: Performed;Min guard Where Assessed - Camera operator Manipulation and Hygiene: Standing Tub/Shower Transfer Method: Not assessed Transfers/Ambulation Related to ADLs: pt requested to return to bed after toilet transfer simulated ADL Comments: min - mod A due to fatigue and dizziness, BP can drop    Mobility       Transfers       Ambulation / Gait / Stairs / Wheelchair Mobility  Ambulation/Gait Ambulation Distance (Feet): 40 Feet General Gait Details: Guarding for safety.  Pt c/o dizziness.  Knees buckled once but able to use UE's on RW to support himself & recover.      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, has a cream for it); still has catheter for PD                          Bowel mgmt: 05-30-13 Bladder mgmt:- no urine Diabetic mgmt - yes, insulin at home     Previous Home Environment Living Arrangements: Spouse/significant other Available Help at Discharge: Family;Available 24 hours/day Type of Home: House Home Layout: Two level;Able to live on main level with bedroom/bathroom;Full bath on main level Alternate Level Stairs-Number of Steps: doesn't go upstairs Home Access: Stairs to enter Entrance Stairs-Rails: Psychiatric nurse of Steps: 3 Bathroom Shower/Tub: Multimedia programmer: Standard Home Care Services: No Additional Comments: walk-in shower  Discharge Living Setting Plans for Discharge  Living Setting: Patient's home Type of Home at Discharge: House Discharge Home Layout: Two level;Able to live on main level with bedroom/bathroom Alternate Level Stairs-Number of Steps: flight Discharge Home Access: Stairs to enter Entrance Stairs-Number of Steps: 3 (3 steps at front, 2 steps at garage entrance) Does the patient have any problems obtaining your medications?: No  Social/Family/Support Systems Patient Roles: Spouse (was working until mid Dec.) Contact Information: Weston Settle, wife Anticipated Caregiver: Parveen Lloyd Anticipated Caregiver's Contact Information: [phone] home,  [phone] Ability/Limitations of Caregiver:  (wife unable to perform heavy lifting but can provide sup/minimal assistance) Caregiver Availability: 24/7 Discharge Plan Discussed with Primary Caregiver: Yes Is Caregiver In Agreement with Plan?: Yes Does Caregiver/Family have Issues with Lodging/Transportation while Pt is in Rehab?: No    Goals/Additional Needs Patient/Family Goal for Rehab: PT/OT supervision to mod I Expected length of stay: 8-12 days Cultural Considerations: none Dietary Needs: renal Equipment Needs: to be determined Special Service Needs: pt was on peritoneal dialysis and is now on HD Pt/Family Agrees to Admission and willing to participate: Yes Program Orientation Provided & Reviewed with Pt/Caregiver Including Roles  & Responsibilities: Yes   Decrease burden of Care through IP rehab admission: NA  Possible need for SNF placement upon discharge: not anticipated   Patient Condition: This patient's medical and functional status has changed since the consult dated: 05-27-13 in which the Rehabilitation Physician determined and documented that the patient's condition is appropriate for intensive rehabilitative care in an inpatient rehabilitation facility. See "History of Present Illness" (above) for medical update. Functional changes are: ambulating 66' with minimal guarding  assistance (but had a fall on 6E on 05-29-13). Patient's medical and functional status update has been discussed with the Rehabilitation physician and patient remains appropriate for inpatient rehabilitation. Will admit to inpatient rehab today.  Preadmission Screen Completed By:  Ave Filter, PT, 05/31/2013 11:56 AM ______________________________________________________________________   Discussed status with Dr. Naaman Plummer on 05-31-13 at 11:52 am and received telephone approval for admission today.  Admission Coordinator:  Stina Gane L. Virgal Warmuth, PT, time 11:52/Date 05-31-13

## 2013-05-31 NOTE — Progress Notes (Signed)
Occupational Therapy Treatment Patient Details Name: Paul [name] MRN: [id number] DOB: 02-08-1956 Today's Date: 05/31/2013 Time: 1000-1045 OT Time Calculation (min): 45 min  OT Assessment / Plan / Recommendation  History of present illness Patient is a 58 y/o male admitted with weakness and fall.  PMH positive for diabetes mellitus with complications of neuropathy, hypokalemia, end stage chronic kidney disease on peritoneal dialysis at home daily.   OT comments  Pt highly motivated and agreeable to ADL training.  Limited by weakness in LEs, heavily reliant on UEs in standing.  Remains deconditioned.  Follow Up Recommendations  CIR;Supervision/Assistance - 24 hour    Barriers to Discharge       Equipment Recommendations       Recommendations for Other Services    Frequency Min 2X/week   Progress towards OT Goals Progress towards OT goals: Progressing toward goals  Plan Discharge plan remains appropriate    Precautions / Restrictions Precautions Precautions: Fall Precaution Comments: reports knees buckling on Saturday, stedy in room Restrictions Weight Bearing Restrictions: No   Pertinent Vitals/Pain VSS, no pain    ADL  Grooming: Wash/dry hands;Wash/dry face;Teeth care;Set up Where Assessed - Grooming: Unsupported sitting Upper Body Bathing: Set up Where Assessed - Upper Body Bathing: Unsupported sitting Lower Body Bathing: Minimal assistance Where Assessed - Lower Body Bathing: Unsupported sitting;Supported sit to stand Upper Body Dressing: Set up Where Assessed - Upper Body Dressing: Unsupported sitting Lower Body Dressing: Moderate assistance Where Assessed - Lower Body Dressing: Unsupported sitting;Supported sit to stand Toilet Transfer: Min Psychiatric nurse Method: Sit to stand Toileting - Water quality scientist and Hygiene: Moderate assistance Where Assessed - Toileting Clothing Manipulation and Hygiene: Standing Equipment Used: Gait belt;Rolling  walker Transfers/Ambulation Related to ADLs: min guard, heavy reliance on UEs with RW for transfer ADL Comments: Pt able to cross foot over opposite knee to bathe and dress feet in sitting, unable to manage pericare and underwear due to heavy reliance on UEs to stand without knees giving way    OT Diagnosis:    OT Problem List:   OT Treatment Interventions:     OT Goals(current goals can now be found in the care plan section) Acute Rehab OT Goals Patient Stated Goal: To get strong enough to manage independent  Visit Information  Last OT Received On: 05/31/13 Assistance Needed: +1 (transfers) History of Present Illness: Patient is a 58 y/o male admitted with weakness and fall.  PMH positive for diabetes mellitus with complications of neuropathy, hypokalemia, end stage chronic kidney disease on peritoneal dialysis at home daily.    Subjective Data      Prior Functioning       Cognition  Cognition Arousal/Alertness: Awake/alert Behavior During Therapy: Flat affect Overall Cognitive Status: Within Functional Limits for tasks assessed    Mobility       Exercises      Balance    End of Session OT - End of Session Patient left: in chair;with call bell/phone within reach;with nursing/sitter in room Nurse Communication: Mobility status  GO     Malka So 05/31/2013, 12:17 PM

## 2013-06-01 ENCOUNTER — Inpatient Hospital Stay (HOSPITAL_COMMUNITY): Payer: Managed Care, Other (non HMO) | Admitting: Physical Therapy

## 2013-06-01 ENCOUNTER — Inpatient Hospital Stay (HOSPITAL_COMMUNITY): Payer: Managed Care, Other (non HMO)

## 2013-06-01 DIAGNOSIS — I951 Orthostatic hypotension: Secondary | ICD-10-CM

## 2013-06-01 DIAGNOSIS — R5381 Other malaise: Secondary | ICD-10-CM

## 2013-06-01 DIAGNOSIS — N186 End stage renal disease: Secondary | ICD-10-CM

## 2013-06-01 LAB — RENAL FUNCTION PANEL
Albumin: 1.5 g/dL — ABNORMAL LOW (ref 3.5–5.2)
BUN: 89 mg/dL — ABNORMAL HIGH (ref 6–23)
CO2: 20 mEq/L (ref 19–32)
Calcium: 7.4 mg/dL — ABNORMAL LOW (ref 8.4–10.5)
Chloride: 91 mEq/L — ABNORMAL LOW (ref 96–112)
Creatinine, Ser: 8.77 mg/dL — ABNORMAL HIGH (ref 0.50–1.35)
GFR calc Af Amer: 7 mL/min — ABNORMAL LOW (ref >90)
GFR calc non Af Amer: 6 mL/min — ABNORMAL LOW (ref >90)
Glucose, Bld: 197 mg/dL — ABNORMAL HIGH (ref 70–99)
Phosphorus: 5.6 mg/dL — ABNORMAL HIGH (ref 2.3–4.6)
Potassium: 4.8 mEq/L (ref 3.7–5.3)
Sodium: 132 mEq/L — ABNORMAL LOW (ref 137–147)

## 2013-06-01 LAB — GLUCOSE, CAPILLARY
GLUCOSE-CAPILLARY: 119 mg/dL — AB (ref 70–99)
Glucose-Capillary: 133 mg/dL — ABNORMAL HIGH (ref 70–99)
Glucose-Capillary: 163 mg/dL — ABNORMAL HIGH (ref 70–99)
Glucose-Capillary: 108 mg/dL — ABNORMAL HIGH (ref 70–99)

## 2013-06-01 LAB — CBC
HCT: 24 % — ABNORMAL LOW (ref 39.0–52.0)
Hemoglobin: 7.9 g/dL — ABNORMAL LOW (ref 13.0–17.0)
MCH: 31 pg (ref 26.0–34.0)
MCHC: 32.9 g/dL (ref 30.0–36.0)
MCV: 94.1 fL (ref 78.0–100.0)
Platelets: 338 10*3/uL (ref 150–400)
RBC: 2.55 MIL/uL — ABNORMAL LOW (ref 4.22–5.81)
RDW: 15.1 % (ref 11.5–15.5)
WBC: 12.5 10*3/uL — ABNORMAL HIGH (ref 4.0–10.5)

## 2013-06-01 LAB — PROTIME-INR
INR: 2.31 — ABNORMAL HIGH (ref 0.00–1.49)
Prothrombin Time: 24.6 seconds — ABNORMAL HIGH (ref 11.6–15.2)

## 2013-06-01 MED ORDER — DARBEPOETIN ALFA-POLYSORBATE 200 MCG/0.4ML IJ SOLN
INTRAMUSCULAR | Status: AC
  Filled 2013-06-01: qty 0.4

## 2013-06-01 MED ORDER — WARFARIN SODIUM 2.5 MG PO TABS
2.5000 mg | ORAL_TABLET | Freq: Once | ORAL | Status: AC
  Administered 2013-06-01: 2.5 mg via ORAL
  Filled 2013-06-01: qty 1

## 2013-06-01 NOTE — Progress Notes (Signed)
Occupational Therapy Session Note  Patient Details  Name: Paul [name] MRN: [id number] Date of Birth: 1955-12-15  Today's Date: 06/01/2013 Time: [id number] Time Calculation (min): 39 min  Short Term Goals: Week 1:  OT Short Term Goal 1 (Week 1): Focus on LTGs secondary short ELOS  Skilled Therapeutic Interventions/Progress Updates:    Pt received sitting in recliner chair reporting feeling "tired" but ready for therapy session. Pt with slight dizziness following stand pivot transfer recliner chair>w/c and BP 97/59 with HR 76. Pt propelled self to ADL apartment and practiced furniture transfer to low couch with min assist and no c/o dizziness. Propelled self to day room and engaged in dynamic standing balance task of using putting green. Pt required min assist on first trial tolerating approx 1 min of standing during task. Pt with cues for anterior weight shift while in standing. Pt took rest break then BP taken while in sitting and was 95/62 and HR 80. Pt then completed golfing task again approx 1-1.5 min with steadying assist for balance. Upon sitting, pt's BP 86/51 with HR 92. Pt then propelled self in w/c to therapy gym and engaged in UE exercises using theraband. 3 exercises x15 reps with rest breaks between each. At end of session pt left sitting in recliner chair with all needs in reach.   Therapy Documentation Precautions:  Precautions Precautions: Fall Restrictions Weight Bearing Restrictions: No General:   Vital Signs: Therapy Vitals Pulse Rate: 80 BP: 101/61 mmHg Patient Position, if appropriate: Sitting (post gait x20') Oxygen Therapy SpO2: 98 % O2 Device: None (Room air) Pain: No report of pain during therapy session.   See FIM for current functional status  Therapy/Group: Individual Therapy  Duayne Cal 06/01/2013, 2:22 PM

## 2013-06-01 NOTE — Evaluation (Signed)
Occupational Therapy Assessment and Plan  Patient Details  Name: Paul [name] MRN: [id number] Date of Birth: 1955/08/17  OT Diagnosis: abnormal posture and muscle weakness (generalized) Rehab Potential: Rehab Potential: Excellent ELOS: 5-7 days  Today's Date: 06/01/2013 Time: 6160-7371 Time Calculation (min): 70 min  Problem List:  Patient Active Problem List   Diagnosis Date Noted  . Gait disorder 05/31/2013  . Weakness 05/24/2013  . Long term (current) use of anticoagulants 04/09/2013  . Hypokalemia 04/04/2013  . Depression 04/03/2013  . GERD (gastroesophageal reflux disease) 04/03/2013  . Atrial fibrillation 04/02/2013  . Hypotension 04/02/2013  . SIRS (systemic inflammatory response syndrome) 04/02/2013  . DM (diabetes mellitus) 12/28/2012  . Other complications due to renal dialysis device, implant, and graft 05/19/2012  . Anemia 07/02/2011  . End stage renal disease 05/29/2011    Past Medical History:  Past Medical History  Diagnosis Date  . Diabetic neuropathy   . Cancer     History of Duodenal Cancer  . Chronic kidney disease     Pt is not eligible for transplant per [facility]  . Diabetes mellitus     Type 2 iddm x 11 yrs  . Anemia     esrd  . Hypertension     meds d/c 'd  2 wks ago for postural hypotension  . Fatigue      tires easily x 2 months  . Chronic nausea     with  morning vomiting; better off  HTN meds  . Intermittent tremor   . Closed left arm fracture 1967  . Heart murmur   . Corneal abrasion, left   . Shortness of breath     exertional  . GERD (gastroesophageal reflux disease)   . History of blood transfusion   . Lack of coordination   . Muscle weakness (generalized)   . Glomerulosclerosis, diabetic   . Depression   . A-fib    Past Surgical History:  Past Surgical History  Procedure Laterality Date  . Whipple procedure  2002    duodenal ca  . Bone marrow biopsy  2012  . Renal biopsy, percutaneous  2012  . Tonsillectomy    .  Portacath placement  2002  . Biliary diversion, external  2002  . Other surgical history      Cyst removed from back  . Av fistula placement  06/07/2011    Procedure: ARTERIOVENOUS (AV) FISTULA CREATION;  Surgeon: Angelia Mould, MD;  Location: [place];  Service: Vascular;  Laterality: Left;  . Ligation of arteriovenous  fistula  05/22/2012    Procedure: LIGATION OF ARTERIOVENOUS  FISTULA;  Surgeon: Mal Misty, MD;  Location: [place];  Service: Vascular;  Laterality: Left;  Left brachio-cephalic fistula  . Insertion of dialysis catheter  05/22/2012    Procedure: INSERTION OF DIALYSIS CATHETER;  Surgeon: Mal Misty, MD;  Location: [place];  Service: Vascular;  Laterality: N/A;  right internal jugular vein  . Av fistula placement  06/18/2012    Procedure: ARTERIOVENOUS (AV) FISTULA CREATION;  Surgeon: Mal Misty, MD;  Location: [place];  Service: Vascular;  Laterality: Right;  [place]  . Colonoscopy      Hx: of  . Capd insertion N/A 01/14/2013    Procedure: LAPAROSCOPIC INSERTION CONTINUOUS AMBULATORY PERITONEAL DIALYSIS  (CAPD) CATHETER;  Surgeon: Adin Hector, MD;  Location: [facility];  Service: General;  Laterality: N/A;    Assessment & Plan Clinical Impression: Patient is a 58 y.o. right-handed male with history of  atrial fibrillation on chronic Coumadin , end-stage renal disease secondary to diabetes mellitus on peritoneal dialysis at home. Patient was using a cane and walker prior to admission. Admitted 05/24/2013 with progressive weakness over several weeks as well as episode of fall when trying to get bed. Patient denies any head trauma although he had been having generalized headache since the fall. Cranial CT scan negative for any acute abnormalities. CT cervical spine with no fracture or malalignment. Blood pressure on admission 90/60 he did receive fluid bolus. Creatinine on admission of 10.02 and improved to 6.27. Followup renal services with hemodialysis ongoing. Bouts of hypokalemia  with supplement added. Physical and occupational therapy evaluations completed 05/26/2013 secondary to weakness deconditioning with recommendations for physical medicine rehabilitation consult to consider inpatient rehabilitation services. Patient transferred to CIR on 05/31/2013 .    Patient currently requires min with basic self-care skills secondary to muscle weakness, decreased cardiorespiratoy endurance and decreased standing balance, decreased postural control and decreased balance strategies.  Prior to hospitalization, patient could complete BADLs with independent .  Patient will benefit from skilled intervention to increase independence with basic self-care skills prior to discharge home at Mod I level with spouse.  Anticipate patient will require no supervision for self-care tasks due to modified independent goals and no further OT follow recommended.  OT - End of Session Activity Tolerance: Decreased this session Endurance Deficit: Yes OT Assessment Rehab Potential: Excellent OT Patient demonstrates impairments in the following area(s): Balance;Endurance;Motor;Pain;Safety OT Basic ADL's Functional Problem(s): Grooming;Bathing;Dressing;Toileting OT Advanced ADL's Functional Problem(s): Laundry OT Transfers Functional Problem(s): Toilet;Tub/Shower OT Additional Impairment(s): None OT Plan OT Intensity: Minimum of 1-2 x/day, 45 to 90 minutes OT Frequency: 5 out of 7 days OT Duration/Estimated Length of Stay: 7-9 days  OT Treatment/Interventions: Balance/vestibular training;Discharge planning;Community reintegration;DME/adaptive equipment instruction;Functional mobility training;Pain management;Psychosocial support;Patient/family education;Self Care/advanced ADL retraining;Therapeutic Activities;UE/LE Strength taining/ROM;Therapeutic Exercise;UE/LE Coordination activities OT Basic Self-Care Anticipated Outcome(s): Mod I OT Toileting Anticipated Outcome(s): Mod I  OT Bathroom Transfers  Anticipated Outcome(s): Mod I  OT Recommendation Patient destination: Home Follow Up Recommendations: None   Skilled Therapeutic Intervention Pt seen for ADL retraining with focus on activity tolerance, dynamic standing balance, functional transfers, and energy conservation techniques. Pt orthostatic during therapy session after ambulating from bed>bathroom. BP was 80/49 with HR 89. Pt with c/o dizziness and RN notified. Pt completed bathing and dressing with steadying assist and cues for energy conservation techniques. Pt required increased time d/t rest breaks. Following bathing and dressing pt ambulated back to bed with steadying using RW and slight LOB posteriorly requiring min assist to correct. Pt with no c/o dizziness and BP was 94/61 with HR of 94. Pt's wife present throughout therapy session. At end of session pt left supine in bed with all needs in reach. Pt with intact safety awareness and no bed alarm at this time.   OT Evaluation Precautions/Restrictions  Precautions Precautions: Fall Restrictions Weight Bearing Restrictions: No General   Vital Signs   Pain Pain Assessment Pain Assessment: No/denies pain Pain Score: 0-No pain Home Living/Prior Functioning Home Living Family/patient expects to be discharged to:: Private residence Living Arrangements: Spouse/significant other Available Help at Discharge: Family;Available 24 hours/day Type of Home: House Home Access: Stairs to enter [employer] of Steps: 3 Entrance Stairs-Rails: Right;Left (cannot reach both) Home Layout: Two level;Able to live on main level with bedroom/bathroom;Full bath on main level Alternate Level Stairs-Number of Steps: 1 step to get into living room  Lives With: Spouse;Other (Comment) (mother in law)  Prior Function Level of Independence: Independent with basic ADLs Driving: Yes ADL   Vision/Perception  Vision - History Baseline Vision: No visual deficits Patient Visual Report: No  change from baseline Vision - Assessment Eye Alignment: Within Functional Limits Perception Perception: Within Functional Limits Praxis Praxis: Intact  Cognition Overall Cognitive Status: Within Functional Limits for tasks assessed Arousal/Alertness: Awake/alert Orientation Level: Oriented X4 Attention: Selective Selective Attention: Appears intact Memory: Appears intact Awareness: Appears intact Problem Solving: Appears intact Behaviors: Other (comment) (flat affect) Safety/Judgment: Appears intact Sensation Sensation Light Touch: Appears Intact Hot/Cold: Appears Intact Coordination Gross Motor Movements are Fluid and Coordinated: No Fine Motor Movements are Fluid and Coordinated: Yes Motor    Mobility     Trunk/Postural Assessment     Balance   Extremity/Trunk Assessment RUE Assessment RUE Assessment: Within Functional Limits (would benefit from UE strengthening) LUE Assessment LUE Assessment: Within Functional Limits (would benefit from UE strengthening)  FIM:  FIM - Grooming Grooming Steps: Wash, rinse, dry face;Wash, rinse, dry hands;Brush, comb hair Grooming: 4: Patient completes 3 of 4 or 4 of 5 steps FIM - Bathing Bathing Steps Patient Completed: Chest;Front perineal area;Right lower leg (including foot);Left lower leg (including foot);Buttocks;Right Arm;Left Arm;Right upper leg;Left upper leg;Abdomen Bathing: 4: Steadying assist FIM - Upper Body Dressing/Undressing Upper body dressing/undressing steps patient completed: Thread/unthread right sleeve of pullover shirt/dresss;Thread/unthread left sleeve of pullover shirt/dress;Put head through opening of pull over shirt/dress;Pull shirt over trunk Upper body dressing/undressing: 5: Set-up assist to: Obtain clothing/put away FIM - Lower Body Dressing/Undressing Lower body dressing/undressing steps patient completed: Thread/unthread right underwear leg;Thread/unthread left underwear leg;Pull underwear  up/down;Thread/unthread right pants leg;Pull pants up/down;Thread/unthread left pants leg;Don/Doff left sock;Don/Doff right sock Lower body dressing/undressing: 4: Steadying Assist FIM - Toileting Toileting steps completed by patient: Adjust clothing prior to toileting;Adjust clothing after toileting;Performs perineal hygiene Toileting Assistive Devices: Grab bar or rail for support Toileting: 4: Steadying assist FIM - IT sales professional Transfer: 5: Supine > Sit: Supervision (verbal cues/safety issues);5: Sit > Supine: Supervision (verbal cues/safety issues) FIM - Radio producer Devices: Mining engineer Transfers: 4-To toilet/BSC: Min A (steadying Pt. > 75%);4-From toilet/BSC: Min A (steadying Pt. > 75%) FIM - Tub/Shower Transfers Tub/Shower Assistive Devices: Walk in shower;Shower chair;Grab bars;Walker Tub/shower Transfers: 4-Into Tub/Shower: Min A (steadying Pt. > 75%/lift 1 leg);4-Out of Tub/Shower: Min A (steadying Pt. > 75%/lift 1 leg)   Refer to Care Plan for Long Term Goals  Recommendations for other services: None  Discharge Criteria: Patient will be discharged from OT if patient refuses treatment 3 consecutive times without medical reason, if treatment goals not met, if there is a change in medical status, if patient makes no progress towards goals or if patient is discharged from hospital.  The above assessment, treatment plan, treatment alternatives and goals were discussed and mutually agreed upon: by patient and by family  Duayne Cal 06/01/2013, 9:57 AM

## 2013-06-01 NOTE — Progress Notes (Signed)
ANTICOAGULATION CONSULT NOTE - Follow-up Consult  Pharmacy Consult:  Coumadin Indication: atrial fibrillation  Allergies  Allergen Reactions  . Metformin And Related Other (See Comments)    Kidney problems    Labs:  Recent Labs  05/29/13 1419 05/30/13 0316 05/30/13 1715 05/31/13 0516 05/31/13 0945 06/01/13 0605  HGB 7.6* 8.3* 8.4*  --   --   --   HCT 23.0* 25.5* 26.7*  --   --   --   PLT 276 310 322  --   --   --   LABPROT  --  24.8*  --   --  26.0* 24.6*  INR  --  2.33*  --   --  2.48* 2.31*  CREATININE 7.49* 3.97*  --  6.27*  --   --     Estimated Creatinine Clearance: 14.7 ml/min (by C-G formula based on Cr of 6.27).  Assessment:  21 YOM with history of Afib to continue on Coumadin from P[facility]. INR 2.31 today, no new cbc, No bleeding noted per chart.  PTA dose: 5mg  every day except on Wednesday and Sundays take 2.5mg    Goal of Therapy:  INR 2-3 Monitor platelets by anticoagulation protocol: Yes   Plan:  Coumadin 2.5 mg po x 1 tonight. F/u daily PT/INR  Maryanna Shape, PharmD, BCPS  Clinical Pharmacist  Pager: [phone]   06/01/2013 1:57 PM

## 2013-06-01 NOTE — Plan of Care (Signed)
Problem: Food- and Nutrition-Related Knowledge Deficit (NB-1.1) Goal: Nutrition education Formal process to instruct or train a patient/client in a skill or to impart knowledge to help patients/clients voluntarily manage or modify food choices and eating behavior to maintain or improve health.  Outcome: Progressing Nutrition Education Note  RD consulted for food choices. Pt was provided Choose-A-Meal Booklet to patient/family yesterday.   Explained why diet restrictions are needed and provided lists of foods to limit/avoid that are high potassium, sodium, and phosphorus. Reinforced specific recommendations on safer alternatives of these foods as discussed with clinical RD yesterday. Strongly encouraged compliance of this diet.   Discussed importance of protein intake at each meal and snack. Provided examples of how to maximize protein intake throughout the day. Reviewed menu with pt and family. Reviewed alternative menu list. Encouraged pt to states preferences with ambassador and to voice is likes and dislikes.  Discussed the importance of adequate nutrition and encouraged intake.  Pt has B[place]ze ordered TID which is when he also receives insulin coverage.  Pt states his blood glucose ranges at home between 120-150 mg/dL   Expect good compliance.  Body mass index is 23.91 kg/(m^2). Pt meets criteria for WNL based on current BMI.  Current diet order is Renal 80/90, patient is consuming approximately 50-75% of meals at this time. Labs and medications reviewed. No further nutrition interventions warranted at this time. RD contact information provided. If additional nutrition issues arise, please re-consult RD.  Brynda Greathouse, MS RD LDN Clinical Inpatient Dietitian Pager: [phone] Weekend/After hours pager: [phone]

## 2013-06-01 NOTE — Progress Notes (Signed)
Orthopedic Tech Progress Note Patient Details:  Paul [name] [birth year] [id number] Abdominal binder delivered to nursing secretary Ortho Devices Type of Ortho Device: Abdominal binder Ortho Device/Splint Interventions: Ordered   [place] 06/01/2013, 3:25 PM

## 2013-06-01 NOTE — Progress Notes (Signed)
Patient information reviewed and entered into eRehab system by Miachel Nardelli, RN, CRRN, PPS Coordinator.  Information including medical coding and functional independence measure will be reviewed and updated through discharge.    

## 2013-06-01 NOTE — Care Management Note (Signed)
[facility] Individual Statement of Services  Patient Name:  Paul [name]  Date:  06/01/2013  Welcome to the [facility].  Our goal is to provide you with an individualized program based on your diagnosis and situation, designed to meet your specific needs.  With this comprehensive rehabilitation program, you will be expected to participate in at least 3 hours of rehabilitation therapies Monday-Friday, with modified therapy programming on the weekends.  Your rehabilitation program will include the following services:  Physical Therapy (PT), Occupational Therapy (OT), 24 hour per day rehabilitation nursing, Neuropsychology, Case Management (Social Worker), Rehabilitation Medicine, Nutrition Services and Pharmacy Services  Weekly team conferences will be held on Wednesday to discuss your progress.  Your Social Worker will talk with you frequently to get your input and to update you on team discussions.  Team conferences with you and your family in attendance may also be held.  Expected length of stay: 7-9 days Overall anticipated outcome: supervision/mod/i level  Depending on your progress and recovery, your program may change. Your Social Worker will coordinate services and will keep you informed of any changes. Your Social Worker's name and contact numbers are listed  below.  The following services may also be recommended but are not provided by the [facility] will be made to provide these services after discharge if needed.  Arrangements include referral to agencies that provide these services.  Your insurance has been verified to be:  [place] Your primary doctor is:  Dr Lauretta Grill [place]  Pertinent information will be shared with your doctor and your insurance company.  Social Worker:  Ovidio Kin, [place] or  ([phone]  Information discussed with and copy given to patient by: [name], 06/01/2013, 9:32 AM

## 2013-06-01 NOTE — Evaluation (Signed)
Physical Therapy Assessment and Plan  Patient Details  Name: Paul [name] MRN: [id number] Date of Birth: [birth year]  PT Diagnosis: Abnormality of gait, Impaired sensation and Muscle weakness Rehab Potential: Good ELOS: 5-7 days   Today's Date: 06/01/2013 Time: 1415-1200 and 1330-1400 Time Calculation (min): 55 min and 30 min  Problem List:  Patient Active Problem List   Diagnosis Date Noted  . Gait disorder 05/31/2013  . Weakness 05/24/2013  . Long term (current) use of anticoagulants 04/09/2013  . Hypokalemia 04/04/2013  . Depression 04/03/2013  . GERD (gastroesophageal reflux disease) 04/03/2013  . Atrial fibrillation 04/02/2013  . Hypotension 04/02/2013  . SIRS (systemic inflammatory response syndrome) 04/02/2013  . DM (diabetes mellitus) 12/28/2012  . Other complications due to renal dialysis device, implant, and graft 05/19/2012  . Anemia 07/02/2011  . End stage renal disease 05/29/2011    Past Medical History:  Past Medical History  Diagnosis Date  . Diabetic neuropathy   . Cancer     History of Duodenal Cancer  . Chronic kidney disease     Pt is not eligible for transplant per [facility]  . Diabetes mellitus     Type 2 iddm x 11 yrs  . Anemia     esrd  . Hypertension     meds d/c 'd  2 wks ago for postural hypotension  . Fatigue      tires easily x 2 months  . Chronic nausea     with  morning vomiting; better off  HTN meds  . Intermittent tremor   . Closed left arm fracture 1967  . Heart murmur   . Corneal abrasion, left   . Shortness of breath     exertional  . GERD (gastroesophageal reflux disease)   . History of blood transfusion   . Lack of coordination   . Muscle weakness (generalized)   . Glomerulosclerosis, diabetic   . Depression   . A-fib    Past Surgical History:  Past Surgical History  Procedure Laterality Date  . Whipple procedure  2002    duodenal ca  . Bone marrow biopsy  2012  . Renal biopsy, percutaneous  2012  . Tonsillectomy     . Portacath placement  2002  . Biliary diversion, external  2002  . Other surgical history      Cyst removed from back  . Av fistula placement  06/07/2011    Procedure: ARTERIOVENOUS (AV) FISTULA CREATION;  Surgeon: Angelia Mould, MD;  Location: [place];  Service: Vascular;  Laterality: Left;  . Ligation of arteriovenous  fistula  05/22/2012    Procedure: LIGATION OF ARTERIOVENOUS  FISTULA;  Surgeon: Mal Misty, MD;  Location: [place];  Service: Vascular;  Laterality: Left;  Left brachio-cephalic fistula  . Insertion of dialysis catheter  05/22/2012    Procedure: INSERTION OF DIALYSIS CATHETER;  Surgeon: Mal Misty, MD;  Location: [place];  Service: Vascular;  Laterality: N/A;  right internal jugular vein  . Av fistula placement  06/18/2012    Procedure: ARTERIOVENOUS (AV) FISTULA CREATION;  Surgeon: Mal Misty, MD;  Location: [place];  Service: Vascular;  Laterality: Right;  [place]  . Colonoscopy      Hx: of  . Capd insertion N/A 01/14/2013    Procedure: LAPAROSCOPIC INSERTION CONTINUOUS AMBULATORY PERITONEAL DIALYSIS  (CAPD) CATHETER;  Surgeon: Adin Hector, MD;  Location: [place];  Service: General;  Laterality: N/A;    Assessment & Plan Clinical Impression: 58 y.o. right-handed male  with history of atrial fibrillation on chronic Coumadin , end-stage renal disease secondary to diabetes mellitus on peritoneal dialysis at home. Patient was using a cane and walker prior to admission. Admitted 05/24/2013 with progressive weakness over several weeks as well as episode of fall when trying to get bed. Patient denies any head trauma although he had been having generalized headache since the fall. Cranial CT scan negative for any acute abnormalities. CT cervical spine with no fracture or malalignment. Blood pressure on admission 90/60 he did receive fluid bolus. Creatinine on admission of 10.02 and improved to 6.27. Followup renal services with hemodialysis ongoing. Patient transferred to CIR on  05/31/2013 .   Patient currently requires min with mobility secondary to muscle weakness, decreased cardiorespiratoy endurance and decreased standing balance, decreased postural control and decreased balance strategies.  Prior to hospitalization, patient was independent  with mobility and lived with Spouse;Other (Comment) (mother-in-law) in a House home.  Home access is 3Stairs to enter.  Patient will benefit from skilled PT intervention to maximize safe functional mobility and minimize fall risk for planned discharge home alone.  Anticipate patient will benefit from follow up OP at discharge.  PT - End of Session Activity Tolerance: Tolerates 30+ min activity with multiple rests Endurance Deficit: Yes PT Assessment Rehab Potential: Good PT Patient demonstrates impairments in the following area(s): Balance;Skin Integrity;Sensory;Endurance;Motor PT Transfers Functional Problem(s): Bed Mobility;Bed to Chair;Car;Furniture;Floor PT Locomotion Functional Problem(s): Ambulation;Stairs;Wheelchair Mobility PT Plan PT Intensity: Minimum of 1-2 x/day ,45 to 90 minutes PT Frequency: 5 out of 7 days PT Duration Estimated Length of Stay: 5-7 days PT Treatment/Interventions: Ambulation/gait training;Balance/vestibular training;Discharge planning;Functional mobility training;DME/adaptive equipment instruction;Neuromuscular re-education;Patient/family education;Skin care/wound management;Stair training;Therapeutic Activities;Therapeutic Exercise;UE/LE Strength taining/ROM;Wheelchair propulsion/positioning PT Transfers Anticipated Outcome(s): Mod I PT Locomotion Anticipated Outcome(s): Supervision to Mod I PT Recommendation Follow Up Recommendations: Outpatient PT Equipment Recommended: To be determined;Rolling walker with 5" wheels Equipment Details: Pt currently borrowing rolling walker and standard cane from father; unable to keep devices after discharge.  Skilled Therapeutic Intervention Treatment  Session 1: PT evaluation performed. See below for detailed findings. Treatment initiated. Pt performed gait x20' in controlled environment with rolling walker prior to onset of knee buckling and pt-reported dizziness, lightheadedness. Pt immediately repositioned in seated in w/c with the following vital signs: BP 101/61, HR 80, SpO2 98%. Symptoms resolved within 1.5 minutes. Performed self-propulsion of w/c x130' in controlled environment using  bilat UE's/LE's with supervision, cueing for correct technique. Stair negotiation deferred secondary to onset of dizziness after performance of sit>stand at foot of stairs. PT educated pt/wife on techniques for controlling/compensating for decrease in BP with positional change, such as continuing to wear TED hose, slowly raising HOB, positioning pt in Trendelenburg position and performing LE exercises, and remaining in standing for >45 seconds prior to performing transfer. Therapist departed with pt seated in bedside chair with wife and social worker present, all pt needs within reach, and pt in no apparent distress. RN and CNA notified of delayed onset of orthostatic symptoms, knee buckling.  Treatment Session 2: Pt received seated in bedside chair with wife present; agreeable to session. Session focused on static/dynamic standing balance, stair negotiation. Pre-session vitals (seated): BP 115/69, HR 72, SpO2 98%. With initial sit>stand, pt reports dizziness, lightheadedness in static standing with bilat UE support at rolling walker. Vitals: BP 99/51, HR 84, SpO2 97%. Pt asymptomatic and BP 95/61 within 1 minute. After seated rest break, performed sit>stand with no symptoms until 53 seconds after standing. Vitals: BP 66/40,  HR 111. Pt repositioned in seated. PT assisted in elevating bilat LE's and guiding pt through LE exercises to increase BP. After BP was stabilized, performed stand pivot from bedside chair>w/c with rolling walker, min A; asymptomatic. BP stable  throughout remainder of session.   Pt performed w/c propulsion x100' (room<>gym) in controlled environment using bilat LE's with supervision. Negotiation of 3 stairs sideways with bilat UE support at R rail, step-to pattern, min A. Static standing without UE support; balance perturbations directed at bilat hips, shoulders to assess balance recovery reactions. See below for detailed findings. PT explained and demonstrated low pivot transfer from mat table<>w/c. Pt gave effective return demonstration of transfer x3 trials; asymptomatic. Session ended in pt room, where pt was left seated in bedside chair with wife present and all needs within reach.  PT Evaluation Precautions/Restrictions Precautions Precautions: Fall Restrictions Weight Bearing Restrictions: No General   Vital SignsTherapy Vitals Pulse Rate: 80 BP: 101/61 mmHg Patient Position, if appropriate: Sitting (post gait x20') Oxygen Therapy SpO2: 98 % O2 Device: None (Room air) Pain   Home Living/Prior Functioning Home Living Available Help at Discharge: Family;Available 24 hours/day Type of Home: House Home Access: Stairs to enter [employer] of Steps: 3 Entrance Stairs-Rails: Right;Left (can't reach both at same time) Home Layout: Two level;Able to live on main level with bedroom/bathroom;Full bath on main level Alternate Level Stairs-Number of Steps: 1 step to get into living room Additional Comments: Downstairs bathroom with double doors, very wide space with exception of area around toilet. Pt does not own personal assistive devices.  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 Leisure:  Hobbies-yes (Comment) Comments: golf, woodworking Vision/Perception  Vision - History Baseline Vision: No visual deficits Patient Visual Report: No change from baseline Vision - Assessment Eye Alignment: Within Functional Limits Perception Perception: Within Functional Limits Praxis Praxis: Intact  Cognition Overall Cognitive Status: Within Functional Limits for tasks assessed Arousal/Alertness: Awake/alert Orientation Level: Oriented X4 Attention: Selective Selective Attention: Appears intact Memory: Appears intact Awareness: Appears intact Problem Solving: Appears intact Behaviors: Other (comment) Safety/Judgment: Appears intact Sensation Sensation Light Touch: Appears Intact Hot/Cold: Appears Intact Proprioception: Impaired Detail Proprioception Impaired Details: Impaired LLE;Impaired RLE Additional Comments: Discriminative touch impaired in bilat feet, ankles, and lower leg; pt reports "tingling" in bilat feet (stocking distribution) which was present PTA. Proprioception impaired in bilat great toes. Coordination Gross Motor Movements are Fluid and Coordinated: No Fine Motor Movements are Fluid and Coordinated: Yes Motor  Motor Motor: Other (comment) Motor - Skilled Clinical Observations: LE muscle weakness  Mobility Bed Mobility Bed Mobility: Supine to Sit;Sit to Supine Supine to Sit: 5: Supervision;HOB flat Supine to Sit Details: Verbal cues for technique Supine to Sit Details (indicate cue type and reason): Cueing to move bilat LE's to EOB Sit to Supine: HOB flat;6: Modified independent (Device/Increase time) Sit to Supine - Details (indicate cue type and reason): Increased time Transfers Transfers: Yes Sit to Stand: 4: Min assist;From bed;From chair/3-in-1;With upper extremity assist Sit to Stand Details: Manual facilitation for weight shifting Sit to Stand Details (indicate cue type and reason): Manual facilitation of anterior weight shifting Stand to Sit:  To bed;To chair/3-in-1;4: Min assist Stand to Sit Details: Min A to control descent; verbal cueing for safe  hand placement. Locomotion  Ambulation Ambulation: Yes Ambulation/Gait Assistance: 4: Min assist Ambulation Distance (Feet): 20 Feet Assistive device: Rolling walker Ambulation/Gait Assistance Details: Gait x20' prior to onset of bilat knee buckling, dizziness, and lightheadedness. See vital signs for detailed readings. Gait Gait: Yes Gait Pattern: Impaired Gait Pattern: Step-through pattern;Shuffle;Narrow base of support;Trunk flexed Gait velocity: Decreased High Level Ambulation High Level Ambulation: Side stepping Stairs / Additional Locomotion Stairs: No (Deferred secondary to onset of dizziness/lightheadedness after sit>stand at foot of stairs.) Wheelchair Mobility Wheelchair Mobility: Yes Wheelchair Assistance: 5: Supervision Wheelchair Propulsion: Both upper extremities;Both lower extermities Wheelchair Parts Management: Needs assistance Distance: 130  Trunk/Postural Assessment  Cervical Assessment Cervical Assessment: Within Functional Limits Thoracic Assessment Thoracic Assessment: Within Functional Limits Lumbar Assessment Lumbar Assessment: Within Functional Limits Postural Control Postural Control: Deficits on evaluation Righting Reactions: Static standing without UE support: balance perturbations directed at hips, shoulders. No stepping strategy with perturbations in all directions; no hip strategy with P/A perturbations, and L ankle strategy limited.  Balance Balance Balance Assessed: Yes Static Sitting Balance Static Sitting - Balance Support: Bilateral upper extremity supported;Feet supported Static Sitting - Level of Assistance: 6: Modified independent (Device/Increase time) Dynamic Sitting Balance Dynamic Sitting - Balance Support: Bilateral upper extremity supported;Feet unsupported;Feet supported (single foot supported) Dynamic Sitting - Level of  Assistance: 5: Stand by assistance Sitting balance - Comments: During LE MMT, pt required SBA with bilat UE support; slight posterior trunk lean noted. Static Standing Balance Static Standing - Balance Support: Bilateral upper extremity supported Static Standing - Level of Assistance: 5: Stand by assistance;4: Min assist Static Standing - Comment/# of Minutes: Static standing with bilat UE support at rolling walker, SBA to min guard secondary to pt dizziness/lightheadedness Dynamic Standing Balance Dynamic Standing - Balance Support: During functional activity Dynamic Standing - Level of Assistance: 4: Min assist;3: Mod assist Dynamic Standing - Balance Activities: Lateral lean/weight shifting;Forward lean/weight shifting;Reaching for objects Extremity Assessment  RUE Assessment RUE Assessment: Within Functional Limits (would benefit from UE strengthening) LUE Assessment LUE Assessment: Within Functional Limits (would benefit from UE strengthening) RLE Assessment RLE Assessment: Exceptions to [facility] RLE Strength RLE Overall Strength: Deficits RLE Overall Strength Comments: Grossly 4/5 with exception of 4-/5 hip flexion, knee extension. LLE Assessment LLE Assessment: Exceptions to [facility] LLE Strength LLE Overall Strength: Deficits LLE Overall Strength Comments: R hip flexion 4/5  FIM:  FIM - Bed/Chair Transfer Bed/Chair Transfer Assistive Devices: Copy: 5: Supine > Sit: Supervision (verbal cues/safety issues);5: Sit > Supine: Supervision (verbal cues/safety issues);4: Bed > Chair or W/C: Min A (steadying Pt. > 75%);4: Chair or W/C > Bed: Min A (steadying Pt. > 75%) FIM - Locomotion: Wheelchair Distance: 130 FIM - Locomotion: Ambulation Locomotion: Ambulation Assistive Devices: Administrator Ambulation/Gait Assistance: 4: Min assist Locomotion: Ambulation: 1: Travels less than 50 ft with minimal assistance (Pt.>75%) FIM - Locomotion: Stairs Locomotion: Stairs: 0:  Activity did not occur (Unsafe at this time secondary to dizziness, lightheadedness)   Refer to Care Plan for Long Term Goals  Recommendations for other services: None  Discharge Criteria: Patient will be discharged from PT if patient refuses treatment 3 consecutive times without medical reason, if treatment goals not met, if there is a change in medical status, if patient makes no progress towards goals or if patient is discharged from hospital.  The above assessment, treatment plan, treatment alternatives and goals were discussed and mutually agreed upon: by patient and by family  Stefano Gaul 06/01/2013, 3:59 PM

## 2013-06-01 NOTE — Progress Notes (Signed)
Social Work Assessment and Plan Social Work Assessment and Plan  Patient Details  Name: Paul [name] MRN: [id number] Date of Birth: Sep 30, 1955  Today's Date: 06/01/2013  Problem List:  Patient Active Problem List   Diagnosis Date Noted  . Gait disorder 05/31/2013  . Weakness 05/24/2013  . Long term (current) use of anticoagulants 04/09/2013  . Hypokalemia 04/04/2013  . Depression 04/03/2013  . GERD (gastroesophageal reflux disease) 04/03/2013  . Atrial fibrillation 04/02/2013  . Hypotension 04/02/2013  . SIRS (systemic inflammatory response syndrome) 04/02/2013  . DM (diabetes mellitus) 12/28/2012  . Other complications due to renal dialysis device, implant, and graft 05/19/2012  . Anemia 07/02/2011  . End stage renal disease 05/29/2011   Past Medical History:  Past Medical History  Diagnosis Date  . Diabetic neuropathy   . Cancer     History of Duodenal Cancer  . Chronic kidney disease     Pt is not eligible for transplant per [facility]  . Diabetes mellitus     Type 2 iddm x 11 yrs  . Anemia     esrd  . Hypertension     meds d/c 'd  2 wks ago for postural hypotension  . Fatigue      tires easily x 2 months  . Chronic nausea     with  morning vomiting; better off  HTN meds  . Intermittent tremor   . Closed left arm fracture 1967  . Heart murmur   . Corneal abrasion, left   . Shortness of breath     exertional  . GERD (gastroesophageal reflux disease)   . History of blood transfusion   . Lack of coordination   . Muscle weakness (generalized)   . Glomerulosclerosis, diabetic   . Depression   . A-fib    Past Surgical History:  Past Surgical History  Procedure Laterality Date  . Whipple procedure  2002    duodenal ca  . Bone marrow biopsy  2012  . Renal biopsy, percutaneous  2012  . Tonsillectomy    . Portacath placement  2002  . Biliary diversion, external  2002  . Other surgical history      Cyst removed from back  . Av fistula placement  06/07/2011   Procedure: ARTERIOVENOUS (AV) FISTULA CREATION;  Surgeon: Angelia Mould, MD;  Location: [place];  Service: Vascular;  Laterality: Left;  . Ligation of arteriovenous  fistula  05/22/2012    Procedure: LIGATION OF ARTERIOVENOUS  FISTULA;  Surgeon: Mal Misty, MD;  Location: [place];  Service: Vascular;  Laterality: Left;  Left brachio-cephalic fistula  . Insertion of dialysis catheter  05/22/2012    Procedure: INSERTION OF DIALYSIS CATHETER;  Surgeon: Mal Misty, MD;  Location: [place];  Service: Vascular;  Laterality: N/A;  right internal jugular vein  . Av fistula placement  06/18/2012    Procedure: ARTERIOVENOUS (AV) FISTULA CREATION;  Surgeon: Mal Misty, MD;  Location: [place];  Service: Vascular;  Laterality: Right;  [place]  . Colonoscopy      Hx: of  . Capd insertion N/A 01/14/2013    Procedure: LAPAROSCOPIC INSERTION CONTINUOUS AMBULATORY PERITONEAL DIALYSIS  (CAPD) CATHETER;  Surgeon: Adin Hector, MD;  Location: [place];  Service: General;  Laterality: N/A;   Social History:  reports that he has never smoked. He has never used smokeless tobacco. He reports that he does not drink alcohol or use illicit drugs.  Family / Support Systems Marital Status: Married Patient Roles: Spouse;Parent;Other (  Comment) (Employee) Spouse/Significant Other: [name]  [phone]  [phone] Children: daughter in [place] Other Supports: Friends and Colleagues Anticipated Caregiver: Wife Ability/Limitations of Caregiver: Wife has back sisues so can not provide much physical care Caregiver Availability: 24/7 Family Dynamics: Close knit family who are there for one another.  Wife can provide assist if needed, but also likes to visit grandchilden in [place].  She goes back and forth to spent time with and care for them.  Social History Preferred language: English Religion: Christian Cultural Background: No issues Education: Secretary/administrator Educated Read: Yes Write: Yes Employment Status: Employed Name  of Employer: Civil engineer, contracting Return to Work Plans: Plans to return to work when able Freight forwarder Issues: No issues Guardian/Conservator: None-according to MD pt is capable of making his own decisions while here.   Abuse/Neglect Physical Abuse: Denies Verbal Abuse: Denies Sexual Abuse: Denies Exploitation of patient/patient's resources: Denies Self-Neglect: Denies  Emotional Status Pt's affect, behavior adn adjustment status: Pt is motivated to improve and return home and back to work.  He has goals set and feels this will push him forward.  His wife is supportive and willing to assist him.  He is a very proud man and wants to be independent and self sufficient. Recent Psychosocial Issues: Other health issues Pyschiatric History: History of depression taking zoloft and feels it is helping.  He reports; " It is all of this in a short period of time."  Deferred depression screen due to exhausted from therapies and wanting to rest.  Will monitor while here and intervene if needed.  Will also have Neuro-psych see while here Substance Abuse History: No issues  Patient / Family Perceptions, Expectations & Goals Pt/Family understanding of illness & functional limitations: Pt and wife have a good understanding of his condition and treatment plan.  He has decided to do HD and has a slot at [facility], wife to transport him.  Pt wants to return to work this is his main goal.  He will talk with HD regarding earlier slot.  Wife plans to be here and observe in therapies. Premorbid pt/family roles/activities: Husband, Father, grandfather, Employee, Church member, etc Anticipated changes in roles/activities/participation: resume Pt/family expectations/goals: Pt states: " I want to feel better, I know it will take time but I know I can do it."  Wife states; " I plan for him to get well and be safer at home, no more falls.  That scares me."  [employer]: Other (Comment) [facility]) Premorbid Home Care/DME Agencies: None Transportation available at discharge: Wife Resource referrals recommended: Support group (specify) (Dialysis Support group)  Discharge Planning Living Arrangements: Spouse/significant other Support Systems: Spouse/significant other;Children;Friends/neighbors;Church/faith community Type of Residence: Private residence Insurance Resources: Multimedia programmer (specify) Psychologist, counselling) Financial Resources: Employment Museum/gallery curator Screen Referred: No Living Expenses: Lives with family Money Management: Spouse;Patient Does the patient have any problems obtaining your medications?: No Home Management: Wife Patient/Family Preliminary Plans: Return home with wife who can be there with him.  Discussed he will need someone with him at first to make sure safe at home and to transporit to HD.  He wants to retrun to work as soon as possible, this may take more time than pt realizes to get his strength back. Social Work Anticipated Follow Up Needs: HH/OP;Support Group  Clinical Impression Pleasant motivated gentleman who is pushing himself hard to regain as much of his independence as possible before discharge from here.  Wife is supportive and willing  to help. Plans to begin HD and will assist with arrangements for this at discharge.  Await team's evaluations regarding goals and length of stay.   [name] 06/01/2013, 2:05 PM

## 2013-06-01 NOTE — Progress Notes (Addendum)
58 y.o. right-handed male with history of atrial fibrillation on chronic Coumadin , end-stage renal disease secondary to diabetes mellitus on peritoneal dialysis at home. Patient was using a cane and walker prior to admission. Admitted 05/24/2013 with progressive weakness over several weeks as well as episode of fall when trying to get bed. Patient denies any head trauma although he had been having generalized headache since the fall. Cranial CT scan negative for any acute abnormalities. CT cervical spine with no fracture or malalignment. Blood pressure on admission 90/60 he did receive fluid bolus. Creatinine on admission of 10.02 and improved to 6.27. Followup renal services with hemodialysis ongoing.   Subjective/Complaints: No problems overnite Discussed rehab process  Review of Systems - Negative except weakness  Objective: Vital Signs: Blood pressure 134/67, pulse 53, temperature 98 F (36.7 C), temperature source Oral, resp. rate 18, height 6\' 2"  (1.88 m), weight 84.5 kg (186 lb 4.6 oz), SpO2 99.00%. No results found. Results for orders placed during the hospital encounter of 05/31/13 (from the past 72 hour(s))  GLUCOSE, CAPILLARY     Status: Abnormal   Collection Time    05/31/13  4:32 PM      Result Value Range   Glucose-Capillary 162 (*) 70 - 99 mg/dL   Comment 1 Notify RN    GLUCOSE, CAPILLARY     Status: Abnormal   Collection Time    05/31/13  9:22 PM      Result Value Range   Glucose-Capillary 112 (*) 70 - 99 mg/dL  GLUCOSE, CAPILLARY     Status: Abnormal   Collection Time    06/01/13  2:55 AM      Result Value Range   Glucose-Capillary 133 (*) 70 - 99 mg/dL  GLUCOSE, CAPILLARY     Status: Abnormal   Collection Time    06/01/13  5:17 AM      Result Value Range   Glucose-Capillary 119 (*) 70 - 99 mg/dL  PROTIME-INR     Status: Abnormal   Collection Time    06/01/13  6:05 AM      Result Value Range   Prothrombin Time 24.6 (*) 11.6 - 15.2 seconds   INR 2.31 (*)  0.00 - 1.49     HEENT: normal Cardio: RRR and no murmur Resp: CTA B/L and unlabored GI: BS positive and non tender,ND, PD cath CDI Extremity:  Pulses negative and No Edema Skin:   Bruise R>L ankle Neuro: Alert/Oriented, Cranial Nerve II-XII normal, Abnormal Sensory reduced sensory R great toe and Abnormal Motor 4/5 Bilateral Deltoidf , bi,tri, grip, HF, KE, ADF, 3-EHL Musc/Skel:  Normal Gen NAD   Assessment/Plan: 1. Functional deficits secondary to Deconditioning which require 3+ hours per day of interdisciplinary therapy in a comprehensive inpatient rehab setting. Physiatrist is providing close team supervision and 24 hour management of active medical problems listed below. Physiatrist and rehab team continue to assess barriers to discharge/monitor patient progress toward functional and medical goals. FIM:                   Comprehension Comprehension Mode: Auditory Comprehension: 5-Understands complex 90% of the time/Cues < 10% of the time  Expression Expression Mode: Verbal Expression: 5-Expresses complex 90% of the time/cues < 10% of the time  Social Interaction Social Interaction: 5-Interacts appropriately 90% of the time - Needs monitoring or encouragement for participation or interaction.  Problem Solving Problem Solving: 5-Solves complex 90% of the time/cues < 10% of the time  Memory Memory: 6-More than  reasonable amt of time  Medical Problem List and Plan:  1. Multifactorial gait disorder related to diabetes mellitus peripheral neuropathy, osteoarthritis, orthostasis and multi-medical  2. DVT Prophylaxis/Anticoagulation: Chronic Coumadin therapy for atrial fibrillation. Monitor for any bleeding episodes. Latest INR 2. 48  3. Pain Management: Flexeril 10 mg each bedtime, Neurontin 100 mg 3 times a day, hydrocodone as needed. Monitor with increased mobility  4. Mood/depression. Zoloft 100 mg daily. Provide emotional support  5. Neuropsych: This patient is  capable of making decisions on his own behalf.  6. End-stage renal disease. Continue hemodialysis as per renal services. Follow closely for activity tolerance as it pertains to volume status, effects of HD, etc  7. Diabetes mellitus with peripheral neuropathy. Hemoglobin A1c 6.7. NovoLog 4 units 3 times a day, Lantus insulin 15 units each bedtime. Check CBGs a.c. and at bedtime  8. Orthostasis. Midodrine 10 mg 3 times a day. Monitor and adjust regimen as indicated based daily analysis of cardiovascular parameters.  -encourage adequate fluid intake  -TEDS, abd binder as needed.  9. Hyperlipidemia. Lipitor  10. Chronic anemia. Aranesp. Followup CBC with dialysis  11. GERD. Protonix    LOS (Days) 1 A FACE TO FACE EVALUATION WAS PERFORMED  KIRSTEINS,ANDREW E 06/01/2013, 6:55 AM

## 2013-06-02 ENCOUNTER — Inpatient Hospital Stay (HOSPITAL_COMMUNITY): Payer: Managed Care, Other (non HMO)

## 2013-06-02 ENCOUNTER — Inpatient Hospital Stay (HOSPITAL_COMMUNITY): Payer: Managed Care, Other (non HMO) | Admitting: Occupational Therapy

## 2013-06-02 ENCOUNTER — Inpatient Hospital Stay (HOSPITAL_COMMUNITY): Payer: Managed Care, Other (non HMO) | Admitting: Physical Therapy

## 2013-06-02 LAB — GLUCOSE, CAPILLARY
GLUCOSE-CAPILLARY: 133 mg/dL — AB (ref 70–99)
Glucose-Capillary: 109 mg/dL — ABNORMAL HIGH (ref 70–99)
Glucose-Capillary: 253 mg/dL — ABNORMAL HIGH (ref 70–99)
Glucose-Capillary: 146 mg/dL — ABNORMAL HIGH (ref 70–99)
Glucose-Capillary: 98 mg/dL (ref 70–99)

## 2013-06-02 LAB — PROTIME-INR
INR: 2.29 — AB (ref 0.00–1.49)
Prothrombin Time: 24.5 seconds — ABNORMAL HIGH (ref 11.6–15.2)

## 2013-06-02 MED ORDER — WARFARIN SODIUM 2.5 MG PO TABS
2.5000 mg | ORAL_TABLET | Freq: Once | ORAL | Status: AC
  Administered 2013-06-02: 2.5 mg via ORAL
  Filled 2013-06-02: qty 1

## 2013-06-02 NOTE — Progress Notes (Signed)
Social Work Patient ID: Paul [name], male   DOB: 1956-04-08, 58 y.o.   MRN: [id number] Met with pt to inform of team conference goals-mod/i level and discharge 1/20.  He feels he is doing better with his blood pressure and  Will be ready to go Tuesday.  Discussed DME and follow up, will await his wife to discuss follow up therapies.

## 2013-06-02 NOTE — Progress Notes (Signed)
Occupational Therapy Session Note  Patient Details  Name: Paul [name] MRN: [id number] Date of Birth: [birth year]  Today's Date: 06/02/2013 Time: [id number] and [id number] Time Calculation (min): 60 min and 40 min   Short Term Goals: Week 1:  OT Short Term Goal 1 (Week 1): Focus on LTGs secondary short ELOS  Skilled Therapeutic Interventions/Progress Updates:    Session 1: Pt seen for ADL retraining with focus on activity tolerance, standing balance, and energy conservation. Pt received supine in bed. Pt rolled to right at supervision level then completed supine>sit and retrieved clothing from drawer from sitting. Pt completed sit<>stand and BP 82/50 with HR 82. Ambulated to shower with min guard assist using RW. Completed bathing at supervision level with carryover of energy conservation techniques for frequent rest breaks before fatiguing without cues. Pt required increased time for dressing d/t rest breaks and steadying assist for standing balance during clothing management around waist. Therapist donned abdominal binder. Completed sit>stand and BP dropping to 65/37 with HR 174. Pt with c/o dizziness and immediately sat in recliner chair with feet elevated until dizziness was resolved and BP increased too 106/68 and HR 80. Pt proceeded to complete oral care and shaving from w/c level at sink. At end of session pt returned to recliner chair at supervision level for stand pivot and BP stable. All needs in reach and no c/o dizziness. RN notified of decline in BP during therapy session.   Session 2: Pt seen for 1:1 skilled OT session with focus on UE strengthening for standing balance and preparation for sit<>stand, activity tolerance, and dynamic standing balance. Completed 1 set of 7 exercises using theraband while following handout with min cues. Pt required rest breaks after every 2 exercises. Propelled self to therapy gym to engage in dynamic balance activities. Began by standing in place and  marching while holding onto RW with supervision. When marching with UEs unsupported pt required min-mod assist for balance during weight shifting. Engaged in Wii balance game 2x for approx 20-35 seconds each. Pt with c/o dizziness during game. BP 98/66 and 99/68 after each trial. Focus of game was weight shifting while standing unsupported. At end of session pt returned to room and no c/o dizziness. Pt left with all needs in reach.   Therapy Documentation Precautions:  Precautions Precautions: Fall Restrictions Weight Bearing Restrictions: No General:   Vital Signs: Therapy Vitals Temp: 98.1 F (36.7 C) Temp src: Oral Pulse Rate: 61 Resp: 18 BP: 94/53 mmHg Patient Position, if appropriate: Lying Oxygen Therapy SpO2: 98 % O2 Device: None (Room air) Pain: Pt reporting pain in feet however did not give numerical rating and declining pain medication.    See FIM for current functional status  Therapy/Group: Individual Therapy  Duayne Cal 06/02/2013, 9:46 AM

## 2013-06-02 NOTE — Patient Care Conference (Signed)
Inpatient RehabilitationTeam Conference and Plan of Care Update Date: 06/02/2013   Time: 10:40 AM    Patient Name: Paul [name]      Medical Record Number: [id number]  Date of Birth: May 27, 1955 Sex: Male         Room/Bed: 4M01C/4M01C-01 Payor Info: Payor: [employer] / Plan: Market researcher / Product Type: *No Product type* /    Admitting Diagnosis: ESRD/HD, Deconditioned   Admit Date/Time:  05/31/2013  3:48 PM Admission Comments: No comment available   Primary Diagnosis:  <principal problem not specified> Principal Problem: <principal problem not specified>  Patient Active Problem List   Diagnosis Date Noted  . Gait disorder 05/31/2013  . Weakness 05/24/2013  . Long term (current) use of anticoagulants 04/09/2013  . Hypokalemia 04/04/2013  . Depression 04/03/2013  . GERD (gastroesophageal reflux disease) 04/03/2013  . Atrial fibrillation 04/02/2013  . Hypotension 04/02/2013  . SIRS (systemic inflammatory response syndrome) 04/02/2013  . DM (diabetes mellitus) 12/28/2012  . Other complications due to renal dialysis device, implant, and graft 05/19/2012  . Anemia 07/02/2011  . End stage renal disease 05/29/2011    Expected Discharge Date: Expected Discharge Date: 06/08/13  Team Members Present: Physician leading conference: Dr. Alysia Penna Social Worker Present: Ovidio Kin, LCSW Nurse Present: Other (comment) Ignatius Specking Turner=RN) PT Present: Georjean Mode, PT;Other (comment) Jilda Roche) OT Present: Gareth Morgan, Starling Manns, OT SLP Present: Germain Osgood, SLP PPS Coordinator present : Daiva Nakayama, RN, CRRN     Current Status/Progress Goal Weekly Team Focus  Medical   Renal failure switched from PD to HD, orthostasis  maintain med stability  maintain BP   Bowel/Bladder   Continent of bowel, LBM 1/13; anuric  Continent of bowel  Remain continent of bowel   Swallow/Nutrition/ Hydration     WFL        ADL's   min assist overall, decreased activity  tolerance, orthostatic  Mod I overall  activity tolerance, strengthening, dynamic balance, energy conservation techniques, education   Mobility   min A overall; limited by orthostatic hypotension  mod I overall  endurance, gait/stair training, pt/family education   Communication     [facility]        Safety/Cognition/ Behavioral Observations    No safety concerns        Pain   Neuropathic pain in BLE managed with vicodin PRN and gabapentin scheduled  Pain </=3  Treat pain symptoms prior to pain reaching moderate/high levels   Skin   Chronic discoloration to BLE, otherwise intact  No new skin breakdown  Encourage patient to turn/boost self to prevent skin breakdown      *See Care Plan and progress notes for long and short-term goals.  Barriers to Discharge: Wife will need to provide 24/7 sup, BP    Possible Resolutions to Barriers:  See above    Discharge Planning/Teaching Needs:    Home with wife who can provide care-supervision level.  Here daily and participating in husband's care     Team Discussion:  BP issues-binder on now.  Doing well activity tolerance and endurance improving.  Will do family education with wife  Revisions to Treatment Plan:  None   Continued Need for Acute Rehabilitation Level of Care: The patient requires daily medical management by a physician with specialized training in physical medicine and rehabilitation for the following conditions: Daily direction of a multidisciplinary physical rehabilitation program to ensure safe treatment while eliciting the highest outcome that is of practical value to the patient.: Yes Daily  medical management of patient stability for increased activity during participation in an intensive rehabilitation regime.: Yes Daily analysis of laboratory values and/or radiology reports with any subsequent need for medication adjustment of medical intervention for : Other  Paul [name] 06/02/2013, 2:04 PM

## 2013-06-02 NOTE — Progress Notes (Signed)
Occupational Therapy Session Note  Patient Details  Name: Paul [name] MRN: [id number] Date of Birth: 05-28-1955  Today's Date: 06/02/2013 Time: 1125-1210 Time Calculation (min): 45 min  Short Term Goals: Week 1:  OT Short Term Goal 1 (Week 1): Focus on LTGs secondary short ELOS  Skilled Therapeutic Interventions/Progress Updates:  Patient resting in recliner with abdominal binder on due to [facility].  Engaged in toilet transfers with 3 in 1 over commode, toileting, activity tolerance, ambulate with RW to therapy apartment, and transfer on/off low love seat.  Patient return demonstrated transfer into and out of practice shower stall using 2 different methods-side stepping and backing in.  Both methods patient uses the RW and reports he prefers backing in then have a seat on the shower chair.  Provided Energy Conservation Strategy handout to include review and discuss.  Patient circled the strategies he might like to incorporate.  Patient ambulated from apt to laundry room with supervision then requested to sit.  Patient propelled self back to his room then transferred back to recliner.  No reports of dizziness just fatigue.  Therapy Documentation Precautions:  Precautions Precautions: Fall Restrictions Weight Bearing Restrictions: No Pain: Denies pain ADL: See FIM for current functional status  Therapy/Group: Individual Therapy  Alaijah Gibler, [place] 06/02/2013, 12:17 PM

## 2013-06-02 NOTE — Progress Notes (Signed)
ANTICOAGULATION CONSULT NOTE - Follow-up Consult  Pharmacy Consult:  Coumadin Indication: atrial fibrillation  Allergies  Allergen Reactions  . Metformin And Related Other (See Comments)    Kidney problems    Labs:  Recent Labs  05/30/13 1715 05/31/13 0516 05/31/13 0945 06/01/13 0605 06/01/13 1723 06/01/13 1724 06/02/13 0600  HGB 8.4*  --   --   --   --  7.9*  --   HCT 26.7*  --   --   --   --  24.0*  --   PLT 322  --   --   --   --  338  --   LABPROT  --   --  26.0* 24.6*  --   --  24.5*  INR  --   --  2.48* 2.31*  --   --  2.29*  CREATININE  --  6.27*  --   --  8.77*  --   --     Estimated Creatinine Clearance: 10.5 ml/min (by C-G formula based on Cr of 8.77).  Assessment:  70 YOM with history of Afib to continue on Coumadin from PTA. INR 2.29, remains therapeutic.  No bleeding noted per review of chart. Chronic anemia in patient with ESRD, on Aranesp SQ every Tues with dialysis.  PTA dose: 5mg  every day except on Wednesday and Sundays take 2.5mg    Goal of Therapy:  INR 2-3 Monitor platelets by anticoagulation protocol: Yes   Plan:  Coumadin 2.5 mg po x 1 tonight. F/u daily PT/INR   Nicole Cella, RPh Clinical Pharmacist Pager: [phone] 06/02/2013 10:40 AM

## 2013-06-02 NOTE — IPOC Note (Signed)
Overall Plan of Care [facility]) Patient Details Name: Paul [name] MRN: [id number] DOB: 04/04/1956  Admitting Diagnosis: ESRD/HD, Deconditioned   Hospital Problems: Active Problems:   Gait disorder     Functional Problem List: Nursing Bladder;Bowel;Pain;Safety;Skin Integrity  PT Warehouse manager;Sensory;Endurance;Motor  OT Balance;Endurance;Motor;Pain;Safety  SLP    TR         Basic ADL's: OT Grooming;Bathing;Dressing;Toileting     Advanced  ADL's: OT Laundry     Transfers: PT Bed Mobility;Bed to Chair;Car;Furniture;Floor  OT Toilet;Tub/Shower     Locomotion: PT Ambulation;Stairs;Wheelchair Mobility     Additional Impairments: OT None  SLP        TR      Anticipated Outcomes Item Anticipated Outcome  Self Feeding    Swallowing      Basic self-care  Mod I  Toileting  Mod I    Bathroom Transfers Mod I   Bowel/Bladder  Pt. will remain continent to bowel and bladder.  Transfers  Mod I  Locomotion  Supervision to Mod I  Communication     Cognition     Pain  Pain level 2,fron scale 1 to 10  Safety/Judgment  Pt. will remain safe,with out fall   Therapy Plan: PT Intensity: Minimum of 1-2 x/day ,45 to 90 minutes PT Frequency: 5 out of 7 days PT Duration Estimated Length of Stay: 5-7 days OT Intensity: Minimum of 1-2 x/day, 45 to 90 minutes OT Frequency: 5 out of 7 days OT Duration/Estimated Length of Stay: 5-7 days         Team Interventions: Nursing Interventions Patient/Family Education;Disease Management/Prevention;Pain Management;Medication Management;Discharge Planning;Skin Care/Wound Management;Psychosocial Support  PT interventions Ambulation/gait training;Balance/vestibular training;Discharge planning;Functional mobility training;DME/adaptive equipment instruction;Neuromuscular re-education;Patient/family education;Skin care/wound management;Stair training;Therapeutic Activities;Therapeutic Exercise;UE/LE Strength taining/ROM;Wheelchair  propulsion/positioning  OT Interventions Balance/vestibular training;Discharge planning;Community Radiographer, therapeutic;Functional mobility training;Pain management;Psychosocial support;Patient/family education;Self Care/advanced ADL retraining;Therapeutic Activities;UE/LE Strength taining/ROM;Therapeutic Exercise;UE/LE Coordination activities  SLP Interventions    TR Interventions    SW/CM Interventions Discharge Planning;Psychosocial Support;Patient/Family Education    Team Discharge Planning: Destination: PT-  ,OT- Home , SLP-  Projected Follow-up: PT-Outpatient PT, OT-  None, SLP-  Projected Equipment Needs: PT-To be determined;Rolling walker with 5" wheels, OT-  , SLP-  Equipment Details: PT-Pt currently borrowing rolling walker and standard cane from father; unable to keep devices after discharge., OT-  Patient/family involved in discharge planning: PT- Patient;Family member/caregiver,  OT-Patient, SLP-   MD ELOS: 7-9 days Medical Rehab Prognosis:  Excellent Assessment: 58 y.o. right-handed male with history of atrial fibrillation on chronic Coumadin , end-stage renal disease secondary to diabetes mellitus on peritoneal dialysis at home. Patient was using a cane and walker prior to admission. Admitted 05/24/2013 with progressive weakness over several weeks as well as episode of fall when trying to get bed. Patient denies any head trauma although he had been having generalized headache since the fall. Cranial CT scan negative for any acute abnormalities. CT cervical spine with no fracture or malalignment. Blood pressure on admission 90/60 he did receive fluid bolus. Creatinine on admission of 10.02 and improved to 6.27. Followup renal services with hemodialysis   Now requiring 24/7 Rehab RN,MD, as well as CIR level PT, OT and SLP.  Treatment team will focus on ADLs and mobility with goals set at Mod I   See Team Conference Notes for weekly updates to the plan of  care

## 2013-06-02 NOTE — Progress Notes (Signed)
58 y.o. right-handed male with history of atrial fibrillation on chronic Coumadin , end-stage renal disease secondary to diabetes mellitus on peritoneal dialysis at home. Patient was using a cane and walker prior to admission. Admitted 05/24/2013 with progressive weakness over several weeks as well as episode of fall when trying to get bed. Patient denies any head trauma although he had been having generalized headache since the fall. Cranial CT scan negative for any acute abnormalities. CT cervical spine with no fracture or malalignment. Blood pressure on admission 90/60 he did receive fluid bolus. Creatinine on admission of 10.02 and improved to 6.27. Followup renal services with hemodialysis ongoing.   Subjective/Complaints: No problems overnite Discussed rehab process  Review of Systems - Negative except weakness  Objective: Vital Signs: Blood pressure 94/53, pulse 61, temperature 98.1 F (36.7 C), temperature source Oral, resp. rate 18, height _0  (1.88 m), weight 83.4 kg (183 lb 13.8 oz), SpO2 98.00%. No results found. Results for orders placed during the hospital encounter of 05/31/13 (from the past 72 hour(s))  GLUCOSE, CAPILLARY     Status: Abnormal   Collection Time    05/31/13  4:32 PM      Result Value Range   Glucose-Capillary 162 (*) 70 - 99 mg/dL   Comment 1 Notify RN    GLUCOSE, CAPILLARY     Status: Abnormal   Collection Time    05/31/13  9:22 PM      Result Value Range   Glucose-Capillary 112 (*) 70 - 99 mg/dL  GLUCOSE, CAPILLARY     Status: Abnormal   Collection Time    06/01/13  2:55 AM      Result Value Range   Glucose-Capillary 133 (*) 70 - 99 mg/dL  GLUCOSE, CAPILLARY     Status: Abnormal   Collection Time    06/01/13  5:17 AM      Result Value Range   Glucose-Capillary 119 (*) 70 - 99 mg/dL  PROTIME-INR     Status: Abnormal   Collection Time    06/01/13  6:05 AM      Result Value Range   Prothrombin Time 24.6 (*) 11.6 - 15.2 seconds   INR 2.31 (*)  0.00 - 1.49  GLUCOSE, CAPILLARY     Status: Abnormal   Collection Time    06/01/13 12:03 PM      Result Value Range   Glucose-Capillary 163 (*) 70 - 99 mg/dL   Comment 1 Notify RN    RENAL FUNCTION PANEL     Status: Abnormal   Collection Time    06/01/13  5:23 PM      Result Value Range   Sodium 132 (*) 137 - 147 mEq/L   Potassium 4.8  3.7 - 5.3 mEq/L   Chloride 91 (*) 96 - 112 mEq/L   CO2 20  19 - 32 mEq/L   Glucose, Bld 197 (*) 70 - 99 mg/dL   BUN 89 (*) 6 - 23 mg/dL   Comment: DELTA CHECK NOTED   Creatinine, Ser 8.77 (*) 0.50 - 1.35 mg/dL   Calcium 7.4 (*) 8.4 - 10.5 mg/dL   Phosphorus 5.6 (*) 2.3 - 4.6 mg/dL   Albumin 1.5 (*) 3.5 - 5.2 g/dL   GFR calc non Af Amer 6 (*) >90 mL/min   GFR calc Af Amer 7 (*) >90 mL/min   Comment: (NOTE)     The eGFR has been calculated using the CKD EPI equation.     This calculation has not been  validated in all clinical situations.     eGFR's persistently <90 mL/min signify possible Chronic Kidney     Disease.  CBC     Status: Abnormal   Collection Time    06/01/13  5:24 PM      Result Value Range   WBC 12.5 (*) 4.0 - 10.5 K/uL   RBC 2.55 (*) 4.22 - 5.81 MIL/uL   Hemoglobin 7.9 (*) 13.0 - 17.0 g/dL   HCT 24.0 (*) 39.0 - 52.0 %   MCV 94.1  78.0 - 100.0 fL   MCH 31.0  26.0 - 34.0 pg   MCHC 32.9  30.0 - 36.0 g/dL   RDW 15.1  11.5 - 15.5 %   Platelets 338  150 - 400 K/uL  GLUCOSE, CAPILLARY     Status: Abnormal   Collection Time    06/01/13  9:40 PM      Result Value Range   Glucose-Capillary 108 (*) 70 - 99 mg/dL  GLUCOSE, CAPILLARY     Status: Abnormal   Collection Time    06/02/13  3:14 AM      Result Value Range   Glucose-Capillary 133 (*) 70 - 99 mg/dL     HEENT: normal Cardio: RRR and no murmur Resp: CTA B/L and unlabored GI: BS positive and non tender,ND, PD cath CDI Extremity:  Pulses negative and No Edema Skin:   Bruise R>L ankle Neuro: Alert/Oriented, Cranial Nerve II-XII normal, Abnormal Sensory reduced sensory R  great toe, left little toe and Abnormal Motor 4/5 Bilateral Deltoidf , bi,tri, grip, HF, KE, ADF, 3-EHL Musc/Skel:  Normal Gen NAD   Assessment/Plan: 1. Functional deficits secondary to Deconditioning which require 3+ hours per day of interdisciplinary therapy in a comprehensive inpatient rehab setting. Physiatrist is providing close team supervision and 24 hour management of active medical problems listed below. Physiatrist and rehab team continue to assess barriers to discharge/monitor patient progress toward functional and medical goals. Team conference today please see physician documentation under team conference tab, met with team face-to-face to discuss problems,progress, and goals. Formulized individual treatment plan based on medical history, underlying problem and comorbidities. FIM: FIM - Bathing Bathing Steps Patient Completed: Chest;Front perineal area;Right lower leg (including foot);Left lower leg (including foot);Buttocks;Right Arm;Left Arm;Right upper leg;Left upper leg;Abdomen Bathing: 4: Steadying assist  FIM - Upper Body Dressing/Undressing Upper body dressing/undressing steps patient completed: Thread/unthread right sleeve of pullover shirt/dresss;Thread/unthread left sleeve of pullover shirt/dress;Put head through opening of pull over shirt/dress;Pull shirt over trunk Upper body dressing/undressing: 5: Set-up assist to: Obtain clothing/put away FIM - Lower Body Dressing/Undressing Lower body dressing/undressing steps patient completed: Thread/unthread right underwear leg;Thread/unthread left underwear leg;Pull underwear up/down;Thread/unthread right pants leg;Pull pants up/down;Thread/unthread left pants leg;Don/Doff left sock;Don/Doff right sock Lower body dressing/undressing: 4: Steadying Assist  FIM - Toileting Toileting steps completed by patient: Adjust clothing prior to toileting;Adjust clothing after toileting;Performs perineal hygiene Toileting Assistive Devices:  Grab bar or rail for support Toileting: 4: Steadying assist  FIM - Radio producer Devices: Mining engineer Transfers: 4-To toilet/BSC: Min A (steadying Pt. > 75%);4-From toilet/BSC: Min A (steadying Pt. > 75%)  FIM - Bed/Chair Transfer Bed/Chair Transfer Assistive Devices: Copy: 5: Supine > Sit: Supervision (verbal cues/safety issues);5: Sit > Supine: Supervision (verbal cues/safety issues);4: Bed > Chair or W/C: Min A (steadying Pt. > 75%);4: Chair or W/C > Bed: Min A (steadying Pt. > 75%)  FIM - Locomotion: Wheelchair Distance: 130 Locomotion: Wheelchair: 2: Travels 50 - 149  ft with supervision, cueing or coaxing FIM - Locomotion: Ambulation Locomotion: Ambulation Assistive Devices: Walker - Rolling Ambulation/Gait Assistance: 4: Min assist Locomotion: Ambulation: 1: Travels less than 50 ft with minimal assistance (Pt.>75%)  Comprehension Comprehension Mode: Auditory Comprehension: 6-Follows complex conversation/direction: With extra time/assistive device  Expression Expression Mode: Verbal Expression: 6-Expresses complex ideas: With extra time/assistive device  Social Interaction Social Interaction: 5-Interacts appropriately 90% of the time - Needs monitoring or encouragement for participation or interaction.  Problem Solving Problem Solving: 5-Solves complex 90% of the time/cues < 10% of the time  Memory Memory: 6-More than reasonable amt of time  Medical Problem List and Plan:  1. Multifactorial gait disorder related to diabetes mellitus peripheral neuropathy, osteoarthritis, orthostasis and multi-medical  2. DVT Prophylaxis/Anticoagulation: Chronic Coumadin therapy for atrial fibrillation. Monitor for any bleeding episodes. Latest INR 2. 48  3. Pain Management: Flexeril 10 mg each bedtime, Neurontin 100 mg 3 times a day, hydrocodone as needed. Monitor with increased mobility  4. Mood/depression. Zoloft 100 mg  daily. Provide emotional support  5. Neuropsych: This patient is capable of making decisions on his own behalf.  6. End-stage renal disease. Continue hemodialysis as per renal services. Follow closely for activity tolerance as it pertains to volume status, effects of HD, etc  7. Diabetes mellitus with peripheral neuropathy. Hemoglobin A1c 6.7. NovoLog 4 units 3 times a day, Lantus insulin 15 units each bedtime. Check CBGs a.c. and at bedtime  8. Orthostasis. Midodrine 10 mg 3 times a day. Monitor and adjust regimen as indicated based daily analysis of cardiovascular parameters.  -encourage adequate fluid intake  -TEDS, abd binder as needed.  9. Hyperlipidemia. Lipitor  10. Chronic anemia. Aranesp. Followup CBC with dialysis  11. GERD. Protonix    LOS (Days) 2 A FACE TO FACE EVALUATION WAS PERFORMED  Tarris Delbene E 06/02/2013, 6:31 AM

## 2013-06-02 NOTE — Progress Notes (Signed)
Physical Therapy Session Note  Patient Details  Name: Paul [name] MRN: [id number] Date of Birth: 10/15/1955  Today's Date: 06/02/2013 Time: [id number] Time Calculation (min): 38 min  Short Term Goals: Week 1:  PT Short Term Goal 1 (Week 1): STG's = LTG's secondary to ELOS  Skilled Therapeutic Interventions/Progress Updates:    Pt received seated in bedside chair; agreeable to therapy. Pt wearing abdominal binder. Reports no dizziness throughout session; activity tolerance limited by bilat LE fatigue. Performed multiple stand pivot transfers from bedside chair<>w/c<>couch<>bed (apartment) with rolling walker and supervision. Self-propulsion of w/c 2x150' in controlled environment using bilat UE's/LE's, supervision for negotiation of narrow door frames. Performed gait x58', x54' in controlled environment, x14' in home environment with rolling walker, min guard. Pt with single episode of knee buckling at end of initial gait trial secondary to onset of dizziness. Static standing with bilat UE support x2.5 min prior to pt request for seated rest break secondary to bilat LE fatigue. Performed supine<>sit in apartment bed with supervision (due to past episodes of orthostatic hypotension). Performed car transfer with rolling walker and supervision, cueing for hand placement. Session ended in pt's room, where pt was left seated in bedside chair with all needs within reach.   Therapy Documentation Precautions:  Precautions Precautions: Fall Restrictions Weight Bearing Restrictions: No General: Amount of Missed PT Time (min): 7 Minutes Missed Time Reason: Patient fatigue Pain: Pain Assessment Pain Assessment: No/denies pain Pain Score: 0-No pain Faces Pain Scale: No hurt Locomotion : Ambulation Ambulation/Gait Assistance: 4: Min guard Wheelchair Mobility Distance: 150   See FIM for current functional status  Therapy/Group: Individual Therapy  Hobble, Malva Cogan 06/02/2013, 12:46 PM

## 2013-06-03 ENCOUNTER — Inpatient Hospital Stay (HOSPITAL_COMMUNITY): Payer: Managed Care, Other (non HMO) | Admitting: Physical Therapy

## 2013-06-03 ENCOUNTER — Inpatient Hospital Stay (HOSPITAL_COMMUNITY): Payer: Managed Care, Other (non HMO)

## 2013-06-03 LAB — GLUCOSE, CAPILLARY
Glucose-Capillary: 85 mg/dL (ref 70–99)
Glucose-Capillary: 75 mg/dL (ref 70–99)
Glucose-Capillary: 175 mg/dL — ABNORMAL HIGH (ref 70–99)
Glucose-Capillary: 97 mg/dL (ref 70–99)

## 2013-06-03 LAB — PROTIME-INR
INR: 2.28 — ABNORMAL HIGH (ref 0.00–1.49)
PROTHROMBIN TIME: 24.4 s — AB (ref 11.6–15.2)

## 2013-06-03 MED ORDER — PENTAFLUOROPROP-TETRAFLUOROETH EX AERO: 1.0000 "application " | INHALATION_SPRAY | CUTANEOUS | Status: DC | PRN

## 2013-06-03 MED ORDER — SODIUM CHLORIDE 0.9 % IV SOLN: 100.0000 mL | INTRAVENOUS | Status: DC | PRN

## 2013-06-03 MED ORDER — HEPARIN SODIUM (PORCINE) 1000 UNIT/ML DIALYSIS: 20.0000 [IU]/kg | INTRAMUSCULAR | Status: DC | PRN

## 2013-06-03 MED ORDER — LIDOCAINE HCL (PF) 1 % IJ SOLN: 5.0000 mL | INTRAMUSCULAR | Status: DC | PRN

## 2013-06-03 MED ORDER — ALTEPLASE 2 MG IJ SOLR: 2.0000 mg | Freq: Once | INTRAMUSCULAR | Status: DC | PRN

## 2013-06-03 MED ORDER — NEPRO/CARBSTEADY PO LIQD: 237.0000 mL | ORAL | Status: DC | PRN

## 2013-06-03 MED ORDER — WARFARIN SODIUM 2.5 MG PO TABS
2.5000 mg | ORAL_TABLET | Freq: Once | ORAL | Status: AC
  Administered 2013-06-03: 2.5 mg via ORAL
  Filled 2013-06-03 (×2): qty 1

## 2013-06-03 MED ORDER — LIDOCAINE-PRILOCAINE 2.5-2.5 % EX CREA: 1.0000 "application " | TOPICAL_CREAM | CUTANEOUS | Status: DC | PRN

## 2013-06-03 MED ORDER — HEPARIN SODIUM (PORCINE) 1000 UNIT/ML DIALYSIS: 1000.0000 [IU] | INTRAMUSCULAR | Status: DC | PRN

## 2013-06-03 NOTE — Progress Notes (Signed)
Physical Therapy Session Note  Patient Details  Name: Paul [name] MRN: [id number] Date of Birth: Dec 07, 1955  Today's Date: 06/03/2013 Time: 13:05-13:35 (23min)  Short Term Goals: Week 1:  PT Short Term Goal 1 (Week 1): STG's = LTG's secondary to ELOS  Skilled Therapeutic Interventions/Progress Updates:  TX focused on therex for LE strength and activity tolerance as well as standing activity, attempting to habituate and icnrease BP with changes in position.  Stand-pivot transfer recliner>WC with min-guard A.  Pt propelled WC x150' with S Seated therex includign each of the following 2x10 with 3# weights: marchi gn, LAQ, and ankle pumps Standing marching, mini-squats, heel raises, and hip ABD x10 each with 1 seated rest.  BP as noted below, low overall, difficulty reading in standing.  Gait in controlled setting with RW and close S x150' Pt left up in recliner with all needs in reach.  BP 127/74 after walk      Therapy Documentation Precautions:  Precautions Precautions: Fall Restrictions Weight Bearing Restrictions: No General:   Vital Signs: Therapy Vitals Pulse Rate: 67 BP: 105/65 mmHg seated; 104/57 after standing; 90/55 after standing ex; 127/74 after walk Patient Position, if appropriate: Sitting Oxygen Therapy SpO2: 98 % Pain: Pain Assessment Pain Assessment: 0-10 Pain Score: 0-No pain Mobility:   Locomotion : Ambulation Ambulation/Gait Assistance: 5: Supervision Wheelchair Mobility Distance: 150   See FIM for current functional status  Therapy/Group: Individual Therapy  Kennieth Rad, PT, DPT   06/03/2013, 1:27 PM

## 2013-06-03 NOTE — Progress Notes (Signed)
ANTICOAGULATION CONSULT NOTE - Follow-up Consult  Pharmacy Consult:  Coumadin Indication: atrial fibrillation  Allergies  Allergen Reactions  . Metformin And Related Other (See Comments)    Kidney problems    Labs:  Recent Labs  06/01/13 0605 06/01/13 1723 06/01/13 1724 06/02/13 0600 06/03/13 0450  HGB  --   --  7.9*  --   --   HCT  --   --  24.0*  --   --   PLT  --   --  338  --   --   LABPROT 24.6*  --   --  24.5* 24.4*  INR 2.31*  --   --  2.29* 2.28*  CREATININE  --  8.77*  --   --   --     Estimated Creatinine Clearance: 10.5 ml/min (by C-G formula based on Cr of 8.77).  Assessment:  29 YOM with history of Afib to continue on Coumadin from [facility]. INR 2.28, remains therapeutic.  No bleeding noted per review of chart.   PTA dose: 5mg  every day except on Wednesday and Sundays take 2.5mg    Goal of Therapy:  INR 2-3 Monitor platelets by anticoagulation protocol: Yes   Plan:  Coumadin 2.5 mg po x 1 tonight. F/u daily PT/INR  Maryanna Shape, PharmD, BCPS  Clinical Pharmacist  Pager: [phone]   06/03/2013 12:54 PM

## 2013-06-03 NOTE — Progress Notes (Signed)
58 y.o. right-handed male with history of atrial fibrillation on chronic Coumadin , end-stage renal disease secondary to diabetes mellitus on peritoneal dialysis at home. Patient was using a cane and walker prior to admission. Admitted 05/24/2013 with progressive weakness over several weeks as well as episode of fall when trying to get bed. Patient denies any head trauma although he had been having generalized headache since the fall. Cranial CT scan negative for any acute abnormalities. CT cervical spine with no fracture or malalignment. Blood pressure on admission 90/60 he did receive fluid bolus. Creatinine on admission of 10.02 and improved to 6.27. Followup renal services with hemodialysis ongoing.   Subjective/Complaints: Dizziness mainly with standing but otherwise no new issues, No SOB drop in sats to 91% Discussed D/C date  Review of Systems - Negative except weakness  Objective: Vital Signs: Blood pressure 128/76, pulse 68, temperature 98.1 F (36.7 C), temperature source Oral, resp. rate 18, height 6\' 2"  (1.88 m), weight 83.4 kg (183 lb 13.8 oz), SpO2 98.00%. No results found. Results for orders placed during the hospital encounter of 05/31/13 (from the past 72 hour(s))  GLUCOSE, CAPILLARY     Status: Abnormal   Collection Time    05/31/13  4:32 PM      Result Value Range   Glucose-Capillary 162 (*) 70 - 99 mg/dL   Comment 1 Notify RN    GLUCOSE, CAPILLARY     Status: Abnormal   Collection Time    05/31/13  9:22 PM      Result Value Range   Glucose-Capillary 112 (*) 70 - 99 mg/dL  GLUCOSE, CAPILLARY     Status: Abnormal   Collection Time    06/01/13  2:55 AM      Result Value Range   Glucose-Capillary 133 (*) 70 - 99 mg/dL  GLUCOSE, CAPILLARY     Status: Abnormal   Collection Time    06/01/13  5:17 AM      Result Value Range   Glucose-Capillary 119 (*) 70 - 99 mg/dL  PROTIME-INR     Status: Abnormal   Collection Time    06/01/13  6:05 AM      Result Value Range    Prothrombin Time 24.6 (*) 11.6 - 15.2 seconds   INR 2.31 (*) 0.00 - 1.49  GLUCOSE, CAPILLARY     Status: Abnormal   Collection Time    06/01/13 12:03 PM      Result Value Range   Glucose-Capillary 163 (*) 70 - 99 mg/dL   Comment 1 Notify RN    RENAL FUNCTION PANEL     Status: Abnormal   Collection Time    06/01/13  5:23 PM      Result Value Range   Sodium 132 (*) 137 - 147 mEq/L   Potassium 4.8  3.7 - 5.3 mEq/L   Chloride 91 (*) 96 - 112 mEq/L   CO2 20  19 - 32 mEq/L   Glucose, Bld 197 (*) 70 - 99 mg/dL   BUN 89 (*) 6 - 23 mg/dL   Comment: DELTA CHECK NOTED   Creatinine, Ser 8.77 (*) 0.50 - 1.35 mg/dL   Calcium 7.4 (*) 8.4 - 10.5 mg/dL   Phosphorus 5.6 (*) 2.3 - 4.6 mg/dL   Albumin 1.5 (*) 3.5 - 5.2 g/dL   GFR calc non Af Amer 6 (*) >90 mL/min   GFR calc Af Amer 7 (*) >90 mL/min   Comment: (NOTE)     The eGFR has been calculated using  the CKD EPI equation.     This calculation has not been validated in all clinical situations.     eGFR's persistently <90 mL/min signify possible Chronic Kidney     Disease.  CBC     Status: Abnormal   Collection Time    06/01/13  5:24 PM      Result Value Range   WBC 12.5 (*) 4.0 - 10.5 K/uL   RBC 2.55 (*) 4.22 - 5.81 MIL/uL   Hemoglobin 7.9 (*) 13.0 - 17.0 g/dL   HCT 24.0 (*) 39.0 - 52.0 %   MCV 94.1  78.0 - 100.0 fL   MCH 31.0  26.0 - 34.0 pg   MCHC 32.9  30.0 - 36.0 g/dL   RDW 15.1  11.5 - 15.5 %   Platelets 338  150 - 400 K/uL  GLUCOSE, CAPILLARY     Status: Abnormal   Collection Time    06/01/13  9:40 PM      Result Value Range   Glucose-Capillary 108 (*) 70 - 99 mg/dL  GLUCOSE, CAPILLARY     Status: Abnormal   Collection Time    06/02/13  3:14 AM      Result Value Range   Glucose-Capillary 133 (*) 70 - 99 mg/dL  PROTIME-INR     Status: Abnormal   Collection Time    06/02/13  6:00 AM      Result Value Range   Prothrombin Time 24.5 (*) 11.6 - 15.2 seconds   INR 2.29 (*) 0.00 - 1.49  GLUCOSE, CAPILLARY     Status: Abnormal    Collection Time    06/02/13  7:20 AM      Result Value Range   Glucose-Capillary 109 (*) 70 - 99 mg/dL   Comment 1 Notify RN    GLUCOSE, CAPILLARY     Status: Abnormal   Collection Time    06/02/13 12:09 PM      Result Value Range   Glucose-Capillary 253 (*) 70 - 99 mg/dL  GLUCOSE, CAPILLARY     Status: Abnormal   Collection Time    06/02/13  5:08 PM      Result Value Range   Glucose-Capillary 146 (*) 70 - 99 mg/dL   Comment 1 Notify RN    GLUCOSE, CAPILLARY     Status: None   Collection Time    06/02/13  9:29 PM      Result Value Range   Glucose-Capillary 98  70 - 99 mg/dL  GLUCOSE, CAPILLARY     Status: None   Collection Time    06/03/13  2:45 AM      Result Value Range   Glucose-Capillary 85  70 - 99 mg/dL  PROTIME-INR     Status: Abnormal   Collection Time    06/03/13  4:50 AM      Result Value Range   Prothrombin Time 24.4 (*) 11.6 - 15.2 seconds   INR 2.28 (*) 0.00 - 1.49  GLUCOSE, CAPILLARY     Status: None   Collection Time    06/03/13  7:34 AM      Result Value Range   Glucose-Capillary 75  70 - 99 mg/dL   Comment 1 Notify RN       HEENT: normal Cardio: RRR and no murmur Resp: CTA B/L and unlabored GI: BS positive and non tender,ND, Abd binder Extremity:  Pulses negative and No Edema  Neuro: Alert/Oriented, Cranial Nerve II-XII normal, Abnormal Sensory reduced sensory R great toe, left little  toe and Abnormal Motor 4/5 Bilateral Deltoidf , bi,tri, grip, HF, KE, ADF, 3-EHL Musc/Skel:  Normal Gen NAD   Assessment/Plan: 1. Functional deficits secondary to Deconditioning which require 3+ hours per day of interdisciplinary therapy in a comprehensive inpatient rehab setting. Physiatrist is providing close team supervision and 24 hour management of active medical problems listed below. Physiatrist and rehab team continue to assess barriers to discharge/monitor patient progress toward functional and medical goals.  FIM: FIM - Bathing Bathing Steps Patient  Completed: Chest;Front perineal area;Right lower leg (including foot);Left lower leg (including foot);Buttocks;Right Arm;Left Arm;Right upper leg;Left upper leg;Abdomen Bathing: 5: Supervision: Safety issues/verbal cues  FIM - Upper Body Dressing/Undressing Upper body dressing/undressing steps patient completed: Thread/unthread right sleeve of pullover shirt/dresss;Thread/unthread left sleeve of pullover shirt/dress;Put head through opening of pull over shirt/dress;Pull shirt over trunk Upper body dressing/undressing: 5: Set-up assist to: Obtain clothing/put away FIM - Lower Body Dressing/Undressing Lower body dressing/undressing steps patient completed: Thread/unthread right underwear leg;Thread/unthread left underwear leg;Pull underwear up/down;Thread/unthread right pants leg;Pull pants up/down;Thread/unthread left pants leg;Don/Doff left sock;Don/Doff right sock Lower body dressing/undressing: 4: Steadying Assist  FIM - Toileting Toileting steps completed by patient: Adjust clothing prior to toileting;Adjust clothing after toileting;Performs perineal hygiene Toileting Assistive Devices: Grab bar or rail for support Toileting: 5: Supervision: Safety issues/verbal cues  FIM - Radio producer Devices:  (3 in 1 over commode) Toilet Transfers: 5-To toilet/BSC: Supervision (verbal cues/safety issues);5-From toilet/BSC: Supervision (verbal cues/safety issues)  FIM - Control and instrumentation engineer Devices: Copy: 5: Supine > Sit: Supervision (verbal cues/safety issues);5: Sit > Supine: Supervision (verbal cues/safety issues);5: Bed > Chair or W/C: Supervision (verbal cues/safety issues);5: Chair or W/C > Bed: Supervision (verbal cues/safety issues)  FIM - Locomotion: Wheelchair Distance: 150 Locomotion: Wheelchair: 5: Travels 150 ft or more: maneuvers on rugs and over door sills with supervision, cueing or coaxing FIM - Locomotion:  Ambulation Locomotion: Ambulation Assistive Devices: Administrator Ambulation/Gait Assistance: 4: Min guard Locomotion: Ambulation: 2: Travels 50 - 149 ft with minimal assistance (Pt.>75%)  Comprehension Comprehension Mode: Auditory Comprehension: 6-Follows complex conversation/direction: With extra time/assistive device  Expression Expression Mode: Verbal Expression: 6-Expresses complex ideas: With extra time/assistive device  Social Interaction Social Interaction: 5-Interacts appropriately 90% of the time - Needs monitoring or encouragement for participation or interaction.  Problem Solving Problem Solving: 5-Solves complex 90% of the time/cues < 10% of the time  Memory Memory: 6-More than reasonable amt of time  Medical Problem List and Plan:  1. Multifactorial gait disorder related to diabetes mellitus peripheral neuropathy, osteoarthritis, orthostasis and multi-medical  2. DVT Prophylaxis/Anticoagulation: Chronic Coumadin therapy for atrial fibrillation. Monitor for any bleeding episodes. Latest INR 2. 48  3. Pain Management: Flexeril 10 mg each bedtime, Neurontin 100 mg 3 times a day, hydrocodone as needed. Monitor with increased mobility  4. Mood/depression. Zoloft 100 mg daily. Provide emotional support  5. Neuropsych: This patient is capable of making decisions on his own behalf.  6. End-stage renal disease. Continue hemodialysis as per renal services. Follow closely for activity tolerance as it pertains to volume status, effects of HD, etc  7. Diabetes mellitus with peripheral neuropathy. Hemoglobin A1c 6.7. NovoLog 4 units 3 times a day, Lantus insulin 15 units each bedtime. Check CBGs a.c. and at bedtime  8. Dizziness/Orthostasis. Midodrine 10 mg 3 times a day.last ortho vitals looked OK even though pt symptomatic, consider other sources will stop gabapentin-encourage adequate fluid intake  -TEDS extend to thigh hi, abd  binder as needed.  9. Hyperlipidemia. Lipitor   10. Chronic anemia. Aranesp. Followup CBC with dialysis  11. GERD. Protonix    LOS (Days) 3 A FACE TO FACE EVALUATION WAS PERFORMED  Micaylah Bertucci E 06/03/2013, 9:23 AM

## 2013-06-03 NOTE — Progress Notes (Signed)
[facility] Progress Note   Subjective: Doing ok on rehab, no complaints  Filed Vitals:   06/01/13 2105 06/01/13 2146 06/02/13 0556 06/03/13 0536  BP: 133/65 132/73 94/53 128/76  Pulse: 68 80 61 68  Temp: 98.1 F (36.7 C) 98 F (36.7 C) 98.1 F (36.7 C) 98.1 F (36.7 C)  TempSrc: Oral Oral Oral Oral  Resp: 18 18 18 18   Height:      Weight: 83.5 kg (184 lb 1.4 oz) 84.7 kg (186 lb 11.7 oz) 83.4 kg (183 lb 13.8 oz)   SpO2: 95% 97% 98% 98%  Exam Alert, sitting up in bed, no distress No jvd Clear lungs bilat, no rales or wheezing RRR no MRG Abd soft, nt/nd Trace ankle edema bilat, bilat sores above the ankle with surrouding erythema, nontender, appear to be healing. No palpable pulses in feet. No ischemic changes. Neuro is nf, ox3 R forearm AVF patent strong bruit  Dialysis: was on PD (EDW was 85kjg,  PD was 6 exchanges/night of 3 liters; last fill 2500, pause 2500; all 2.5%)  Assess/Plan: 1. Debility: on CIR 2. Gen weakness/falls/malnutrition: felt to be doing poorly on PD, switched to HD with improvement.  3. ESRD: was PD, now HD. Keeping PD cath in, flush weekly. Establish new dry wt. Has AVF. On coumadin, INR >2, tight hep prn 4. Chronic hypOtension/volume: on midodrine here as at home, 10 tid. Mild LE edema, BP's low normal. UF 2-3 kg with HD today as tolerated.  5. Anemia of CKD: max esa with darbe 200/wk, loaded with IV Fe here 6. MBD/CKD: hectorol w HD, cont phoslo, Ca and P ok, will check PTH 7. Afib: NSR on amio and coumadin 8. DM: insulin 9. Neuropathy / Depression: neurontin and zoloft  Kelly Splinter MD (pgr) [phone]    [phone] 06/03/2013, 9:10 AM   Recent Labs Lab 05/29/13 1419 05/30/13 0316 05/31/13 0516 06/01/13 1723  NA 137 137 133* 132*  K 4.2 3.5* 4.3 4.8  CL 95* 94* 93* 91*  CO2 20 29 24 20   GLUCOSE 138* 212* 156* 197*  BUN 68* 31* 57* 89*  CREATININE 7.49* 3.97* 6.27* 8.77*  CALCIUM 7.2* 7.2* 7.4* 7.4*  PHOS 5.4*  --   4.1 5.6*    Recent Labs Lab 05/29/13 1419 05/31/13 0516 06/01/13 1723  ALBUMIN 1.6* 1.5* 1.5*    Recent Labs Lab 05/30/13 0316 05/30/13 1715 06/01/13 1724  WBC 10.8* 11.9* 12.5*  HGB 8.3* 8.4* 7.9*  HCT 25.5* 26.7* 24.0*  MCV 97.0 98.5 94.1  PLT 310 322 338   . aspirin EC  81 mg Oral Daily  . atorvastatin  10 mg Oral Daily  . calcium acetate  2,001 mg Oral TID WC  . cholecalciferol  1,000 Units Oral Daily  . cyclobenzaprine  10 mg Oral QHS  . darbepoetin (ARANESP) injection - DIALYSIS  200 mcg Intravenous Q Tue-HD  . feeding supplement (RESOURCE BREEZE)  1 Container Oral TID WC  . ferric gluconate (FERRLECIT/NULECIT) IV  125 mg Intravenous Q T,Th,Sa-HD  . gabapentin  100 mg Oral TID  . insulin aspart  0-15 Units Subcutaneous TID WC  . insulin glargine  10 Units Subcutaneous Daily  . midodrine  10 mg Oral TID WC  . multivitamin  1 tablet Oral QHS  . pantoprazole  40 mg Oral Daily  . sertraline  100 mg Oral Daily  . Warfarin - Pharmacist Dosing Inpatient   Does not apply q1800     sodium chloride,  sodium chloride, acetaminophen, feeding supplement (NEPRO CARB STEADY), heparin, heparin, HYDROcodone-acetaminophen, lidocaine (PF), lidocaine-prilocaine, menthol-cetylpyridinium, ondansetron, pentafluoroprop-tetrafluoroeth, sorbitol

## 2013-06-03 NOTE — Progress Notes (Signed)
Occupational Therapy Session Note  Patient Details  Name: Paul [name] MRN: [id number] Date of Birth: 03/11/1956  Today's Date: 06/03/2013 Time: 1015-1100 Time Calculation (min): 45 min  Short Term Goals: Week 1:  OT Short Term Goal 1 (Week 1): Focus on LTGs secondary short ELOS  Skilled Therapeutic Interventions/Progress Updates:    Pt seen for ADL retraining with focus on functional transfers, dynamic standing balance, and activity tolerance. Pt completed bathing and dressing at supervision level and mild dizziness on one occasion however BP stable. Discussed whenever standing to take a few seconds before beginning movements and pt demonstrated carryover of this. Pt required increased time for self-care tasks secondary to fatigue. Therapist applied abdominal binder and thigh high TED hose during session. Pt completed grooming tasks while in standing at sink approx 1.5 min and no c/o dizziness. Pt reporting his wife informed him that their couch at home was similar to the one in the family room. Practiced furniture transfer x2 at supervision level and cues for scooting to edge of couch before standing. At end of session pt left sitting in recliner chair with all needs in reach.   Therapy Documentation Precautions:  Precautions Precautions: Fall Restrictions Weight Bearing Restrictions: No General:   Vital Signs:   Pain: No report of pain during therapy session.   See FIM for current functional status  Therapy/Group: Individual Therapy  Duayne Cal 06/03/2013, 12:28 PM

## 2013-06-03 NOTE — Progress Notes (Signed)
Physical Therapy Session Note  Patient Details  Name: Paul [name] MRN: [id number] Date of Birth: 28-Dec-1955  Today's Date: 06/03/2013 Time: [id number] and [id number] Time Calculation (min): 58 min and 38 min  Short Term Goals: Week 1:  PT Short Term Goal 1 (Week 1): STG's = LTG's secondary to ELOS  Skilled Therapeutic Interventions/Progress Updates:    Treatment Session 1: Pt received seated in bedside chair; agreeable to therapy. Pt reports dizziness/lightheadedness immediately following sit>stand transfer; however, BP increased with transfer. See vital signs for detailed findings. Performed gait x83', x98' in controlled environment with rolling walker, supervision; no episodes of dizziness/lightheadedness. W/c mobility 2x150' (to/from room) in controlled environment using bilat LE's (intermittent use of bilat UE's) with supervision. Attempted to perform stair negotiation; however, not attempted secondary to pt with bilat knee buckling after sit>stand from w/c. Therapist departed with pt seated in bedside chair with all needs within each.  Treatment Session 2: Pt received seated in bedside chair; agreeable to therapy. Session focused on functional standing balance and walking tolerance. Berg Balance Scale score =36/56. See test for detailed findings. Gait x318' total (standing rest break at ~100') in controlled environment with rolling walker and supervision. Pt reports no dizziness/lightheadedness during Berg, gait. Frequent rest breaks required throughout session secondary to pt fatigue.  Post-session, pt reports pain in bilat feet; RN notified and medication administered. Supine>sit with mod I, increased time, HOB flat, and no rails. Therapist departed with pt semi-reclined in bed with 2 rails up and all needs within reach.  Therapy Documentation Precautions:  Precautions Precautions: Fall Restrictions Weight Bearing Restrictions: No Vital Signs: Therapy Vitals Pulse Rate: 67 BP: 105/65  mmHg Patient Position, if appropriate: Sitting Oxygen Therapy SpO2: 98 % Pain: Pain Assessment Pain Assessment: 0-10 Pain Score: 0-No pain Locomotion : Ambulation Ambulation/Gait Assistance: 5: Supervision Wheelchair Mobility Distance: 150   See FIM for current functional status  Therapy/Group: Individual Therapy  Delsy Etzkorn, Malva Cogan 06/03/2013, 1:27 PM

## 2013-06-04 ENCOUNTER — Inpatient Hospital Stay (HOSPITAL_COMMUNITY): Payer: Managed Care, Other (non HMO) | Admitting: *Deleted

## 2013-06-04 ENCOUNTER — Inpatient Hospital Stay (HOSPITAL_COMMUNITY): Payer: Managed Care, Other (non HMO)

## 2013-06-04 ENCOUNTER — Inpatient Hospital Stay (HOSPITAL_COMMUNITY): Payer: Managed Care, Other (non HMO) | Admitting: Speech Pathology

## 2013-06-04 ENCOUNTER — Inpatient Hospital Stay (HOSPITAL_COMMUNITY): Payer: Managed Care, Other (non HMO) | Admitting: Physical Therapy

## 2013-06-04 DIAGNOSIS — N186 End stage renal disease: Secondary | ICD-10-CM

## 2013-06-04 DIAGNOSIS — I951 Orthostatic hypotension: Secondary | ICD-10-CM

## 2013-06-04 DIAGNOSIS — R5381 Other malaise: Secondary | ICD-10-CM

## 2013-06-04 LAB — GLUCOSE, CAPILLARY
GLUCOSE-CAPILLARY: 207 mg/dL — AB (ref 70–99)
GLUCOSE-CAPILLARY: 150 mg/dL — AB (ref 70–99)
GLUCOSE-CAPILLARY: 134 mg/dL — AB (ref 70–99)
GLUCOSE-CAPILLARY: 111 mg/dL — AB (ref 70–99)
Glucose-Capillary: 116 mg/dL — ABNORMAL HIGH (ref 70–99)
Glucose-Capillary: 246 mg/dL — ABNORMAL HIGH (ref 70–99)

## 2013-06-04 LAB — PROTIME-INR
INR: 2.1 — AB (ref 0.00–1.49)
Prothrombin Time: 22.9 seconds — ABNORMAL HIGH (ref 11.6–15.2)

## 2013-06-04 MED ORDER — WARFARIN SODIUM 5 MG PO TABS
5.0000 mg | ORAL_TABLET | Freq: Once | ORAL | Status: AC
  Administered 2013-06-04: 5 mg via ORAL
  Filled 2013-06-04: qty 1

## 2013-06-04 NOTE — Progress Notes (Signed)
Social Work Patient ID: Paul [name], male   DOB: 06/01/1955, 58 y.o.   MRN: [id number] Have begun the [employer] process to obtain pt's rolling walker and bsc.  Will check on Monday to see if close to being delivered, have asked For DME to be delivered Monday so is ready and not to slow discharge on Tuesday.

## 2013-06-04 NOTE — Progress Notes (Signed)
Physical Therapy Session Note  Patient Details  Name: Paul [name] MRN: [id number] Date of Birth: 05-Aug-1955  Today's Date: 06/04/2013 Time: 1435-1600 Time Calculation (min): 85 min  Short Term Goals: Week 1:  PT Short Term Goal 1 (Week 1): STG's = LTG's secondary to ELOS  Skilled Therapeutic Interventions/Progress Updates:    Patient received sitting in recliner. Session focused on functional transfers, gait, and dynamic balance. Wheelchair mobility 150' with B LE for increased strength and coordination with LEs. Gait training 200' x2 with RW and supervision, 47' x2 and 125' x1 without AD and min guard. Gait performed in controlled environments and in family room and ADL apartment (tile and carpet) to simulate home environment. Attempted several furniture transfers from low, cushioned surfaces, requires supervision. Patient negotiated 5 stairs sideways (step to pattern) with one handrail and supervision and 5 stairs forwards (alternating pattern) with one handrail and supervision.  Ball toss in standing progressing to ball toss while walking x75' with minA. Side stepping with hip abd while pushing BOSU ball x15 each direction. Standing minimal external perturbations, no LOB, good hip strategies noted, impaired ankle strategies. Standing hip abduction with B HHA x10 each side. Quadruped with alternating UE taps. Patient returned to room and left seated in recliner with all needs within reach.  Therapy Documentation Precautions:  Precautions Precautions: Fall Restrictions Weight Bearing Restrictions: No Pain: Pain Assessment Pain Assessment: No/denies pain Pain Score: 0-No pain Locomotion : Ambulation Ambulation/Gait Assistance: 5: Supervision Wheelchair Mobility Distance: 150   See FIM for current functional status  Therapy/Group: Individual Therapy  Lillia Abed. Haifa Hatton, PT, DPT 06/04/2013, 4:26 PM

## 2013-06-04 NOTE — Progress Notes (Signed)
Subjective:  Slightly dizzy yesterday when standing, no complaints today, no dyspnea.  Objective: Vital signs in last 24 hours: Temp:  [96.9 F (36.1 C)-98 F (36.7 C)] 98 F (36.7 C) (01/16 0500) Pulse Rate:  [60-84] 82 (01/16 0500) Resp:  [16-18] 18 (01/16 0500) BP: (90-143)/(49-100) 131/68 mmHg (01/16 0607) SpO2:  [98 %-100 %] 98 % (01/16 0500) Weight:  [82.5 kg (181 lb 14.1 oz)-86.3 kg (190 lb 4.1 oz)] 82.5 kg (181 lb 14.1 oz) (01/15 2107) Weight change:   Intake/Output from previous day: 01/15 0701 - 01/16 0700 In: 600 [P.O.:600] Out: 2978    Lab Results:  Recent Labs  06/01/13 1724  WBC 12.5*  HGB 7.9*  HCT 24.0*  PLT 338   BMET:  Recent Labs  06/01/13 1723  NA 132*  K 4.8  CL 91*  CO2 20  GLUCOSE 197*  BUN 89*  CREATININE 8.77*  CALCIUM 7.4*  ALBUMIN 1.5*   No results found for this basename: PTH,  in the last 72 hours Iron Studies: No results found for this basename: IRON, TIBC, TRANSFERRIN, FERRITIN,  in the last 72 hours  EXAM: General appearance:  Alert, in no apparent distress Resp:  CTA without rales, rhonchi, or wheezes Cardio:  RRR without murmur or rub GI: + BS, soft and nontender Extremities:  No edema Access:  AVF @ RFA with + bruit  Dialysis: was on PD, EDW was 85kg, PD was 6 exchanges/night of 3 liters; last fill 2500, pause 2500; all 2.5%)  Assessment/Plan: 1. Debility - in rehab; still with some dizziness when standing. 2. Weakness/falls - was doing poorly on PD, switched to HD with improvement. 3. ESRD - previously on PD, switched to HD on TTS @ [facility] per AVF; flush PD catheter weekly, on Coumadin with tight Heparin PRN per HD. 4. Chronic hypotension/Volume - BP 131/68 on Midodrine 10 mg tid; wt down to 82.5 kg s/p net UF 3 L yesterday. 5. Anemia - Hgb down to 7.9 on 1/13, on Aranesp 200 mcg on Tues, IV Fe per HD. 6. Sec HPT - Ca 7.4 (9.4 corrected), P 5.6; Vitamin D PO, Phoslo 3 with meals. 7. Nutrition - Alb 1.5, renal diet,  vitamin. 8. DM - on Insulin. 9. A-fib - on Amiodarone & Coumadin. 10. Neuropathy/Depression - on Neurontin & Zoloft.    LOS: 4 days   LYLES,CHARLES 06/04/2013,7:39 AM  I have seen and examined patient, discussed with PA and agree with assessment and plan as outlined above. Kelly Splinter MD pager [phone]    cell [phone] 06/04/2013, 12:47 PM

## 2013-06-04 NOTE — Progress Notes (Signed)
58 y.o. right-handed male with history of atrial fibrillation on chronic Coumadin , end-stage renal disease secondary to diabetes mellitus on peritoneal dialysis at home. Patient was using a cane and walker prior to admission. Admitted 05/24/2013 with progressive weakness over several weeks as well as episode of fall when trying to get bed. Patient denies any head trauma although he had been having generalized headache since the fall. Cranial CT scan negative for any acute abnormalities. CT cervical spine with no fracture or malalignment. Blood pressure on admission 90/60 he did receive fluid bolus. Creatinine on admission of 10.02 and improved to 6.27. Followup renal services with hemodialysis ongoing.   Subjective/Complaints: Dizziness mainly with standing but otherwise no new issues, No SOB drop in sats to 91% Discussed D/C date  Review of Systems - Negative except weakness  Objective: Vital Signs: Blood pressure 91/49, pulse 82, temperature 98 F (36.7 C), temperature source Oral, resp. rate 18, height $RemoveBe'6\' 2"'[id number]$  (1.88 m), weight 82.5 kg (181 lb 14.1 oz), SpO2 98.00%. No results found. Results for orders placed during the hospital encounter of 05/31/13 (from the past 72 hour(s))  PROTIME-INR     Status: Abnormal   Collection Time    06/01/13  6:05 AM      Result Value Range   Prothrombin Time 24.6 (*) 11.6 - 15.2 seconds   INR 2.31 (*) 0.00 - 1.49  GLUCOSE, CAPILLARY     Status: Abnormal   Collection Time    06/01/13 12:03 PM      Result Value Range   Glucose-Capillary 163 (*) 70 - 99 mg/dL   Comment 1 Notify RN    RENAL FUNCTION PANEL     Status: Abnormal   Collection Time    06/01/13  5:23 PM      Result Value Range   Sodium 132 (*) 137 - 147 mEq/L   Potassium 4.8  3.7 - 5.3 mEq/L   Chloride 91 (*) 96 - 112 mEq/L   CO2 20  19 - 32 mEq/L   Glucose, Bld 197 (*) 70 - 99 mg/dL   BUN 89 (*) 6 - 23 mg/dL   Comment: DELTA CHECK NOTED   Creatinine, Ser 8.77 (*) 0.50 - 1.35 mg/dL   Calcium 7.4 (*) 8.4 - 10.5 mg/dL   Phosphorus 5.6 (*) 2.3 - 4.6 mg/dL   Albumin 1.5 (*) 3.5 - 5.2 g/dL   GFR calc non Af Amer 6 (*) >90 mL/min   GFR calc Af Amer 7 (*) >90 mL/min   Comment: (NOTE)     The eGFR has been calculated using the CKD EPI equation.     This calculation has not been validated in all clinical situations.     eGFR's persistently <90 mL/min signify possible Chronic Kidney     Disease.  CBC     Status: Abnormal   Collection Time    06/01/13  5:24 PM      Result Value Range   WBC 12.5 (*) 4.0 - 10.5 K/uL   RBC 2.55 (*) 4.22 - 5.81 MIL/uL   Hemoglobin 7.9 (*) 13.0 - 17.0 g/dL   HCT 24.0 (*) 39.0 - 52.0 %   MCV 94.1  78.0 - 100.0 fL   MCH 31.0  26.0 - 34.0 pg   MCHC 32.9  30.0 - 36.0 g/dL   RDW 15.1  11.5 - 15.5 %   Platelets 338  150 - 400 K/uL  GLUCOSE, CAPILLARY     Status: Abnormal   Collection Time  06/01/13  9:40 PM      Result Value Range   Glucose-Capillary 108 (*) 70 - 99 mg/dL  GLUCOSE, CAPILLARY     Status: Abnormal   Collection Time    06/02/13  3:14 AM      Result Value Range   Glucose-Capillary 133 (*) 70 - 99 mg/dL  PROTIME-INR     Status: Abnormal   Collection Time    06/02/13  6:00 AM      Result Value Range   Prothrombin Time 24.5 (*) 11.6 - 15.2 seconds   INR 2.29 (*) 0.00 - 1.49  GLUCOSE, CAPILLARY     Status: Abnormal   Collection Time    06/02/13  7:20 AM      Result Value Range   Glucose-Capillary 109 (*) 70 - 99 mg/dL   Comment 1 Notify RN    GLUCOSE, CAPILLARY     Status: Abnormal   Collection Time    06/02/13 12:09 PM      Result Value Range   Glucose-Capillary 253 (*) 70 - 99 mg/dL  GLUCOSE, CAPILLARY     Status: Abnormal   Collection Time    06/02/13  5:08 PM      Result Value Range   Glucose-Capillary 146 (*) 70 - 99 mg/dL   Comment 1 Notify RN    GLUCOSE, CAPILLARY     Status: None   Collection Time    06/02/13  9:29 PM      Result Value Range   Glucose-Capillary 98  70 - 99 mg/dL  GLUCOSE, CAPILLARY      Status: None   Collection Time    06/03/13  2:45 AM      Result Value Range   Glucose-Capillary 85  70 - 99 mg/dL  PROTIME-INR     Status: Abnormal   Collection Time    06/03/13  4:50 AM      Result Value Range   Prothrombin Time 24.4 (*) 11.6 - 15.2 seconds   INR 2.28 (*) 0.00 - 1.49  GLUCOSE, CAPILLARY     Status: None   Collection Time    06/03/13  7:34 AM      Result Value Range   Glucose-Capillary 75  70 - 99 mg/dL   Comment 1 Notify RN    GLUCOSE, CAPILLARY     Status: Abnormal   Collection Time    06/03/13 11:20 AM      Result Value Range   Glucose-Capillary 175 (*) 70 - 99 mg/dL   Comment 1 Notify RN    GLUCOSE, CAPILLARY     Status: None   Collection Time    06/03/13 10:13 PM      Result Value Range   Glucose-Capillary 97  70 - 99 mg/dL  GLUCOSE, CAPILLARY     Status: Abnormal   Collection Time    06/04/13  3:12 AM      Result Value Range   Glucose-Capillary 207 (*) 70 - 99 mg/dL     HEENT: normal Cardio: RRR and no murmur Resp: CTA B/L and unlabored GI: BS positive and non tender,ND, Abd binder Extremity:  Pulses negative and No Edema Skin:  Erythema at ankles with dry necrotic area no drainage,non tender Neuro: Alert/Oriented, Cranial Nerve II-XII normal, Abnormal Sensory reduced sensory R great toe, left little toe and Abnormal Motor 4/5 Bilateral Deltoidf , bi,tri, grip, HF, KE, ADF, 3-EHL Musc/Skel:  Normal Gen NAD   Assessment/Plan: 1. Functional deficits secondary to Deconditioning which require 3+  hours per day of interdisciplinary therapy in a comprehensive inpatient rehab setting. Physiatrist is providing close team supervision and 24 hour management of active medical problems listed below. Physiatrist and rehab team continue to assess barriers to discharge/monitor patient progress toward functional and medical goals.  FIM: FIM - Bathing Bathing Steps Patient Completed: Chest;Front perineal area;Right lower leg (including foot);Left lower leg  (including foot);Buttocks;Right Arm;Left Arm;Right upper leg;Left upper leg;Abdomen Bathing: 5: Supervision: Safety issues/verbal cues  FIM - Upper Body Dressing/Undressing Upper body dressing/undressing steps patient completed: Thread/unthread right sleeve of pullover shirt/dresss;Thread/unthread left sleeve of pullover shirt/dress;Put head through opening of pull over shirt/dress;Pull shirt over trunk Upper body dressing/undressing: 5: Set-up assist to: Obtain clothing/put away FIM - Lower Body Dressing/Undressing Lower body dressing/undressing steps patient completed: Thread/unthread right underwear leg;Thread/unthread left underwear leg;Pull underwear up/down;Thread/unthread right pants leg;Pull pants up/down;Thread/unthread left pants leg;Don/Doff left sock;Don/Doff right sock Lower body dressing/undressing: 5: Set-up assist to: Don/Doff TED stocking  FIM - Toileting Toileting steps completed by patient: Adjust clothing prior to toileting;Adjust clothing after toileting;Performs perineal hygiene Toileting Assistive Devices: Grab bar or rail for support Toileting: 5: Supervision: Safety issues/verbal cues  FIM - Radio producer Devices:  (3 in 1 over commode) Toilet Transfers: 5-To toilet/BSC: Supervision (verbal cues/safety issues);5-From toilet/BSC: Supervision (verbal cues/safety issues)  FIM - Engineer, site Assistive Devices: Arm rests Bed/Chair Transfer: 5: Bed > Chair or W/C: Supervision (verbal cues/safety issues);5: Chair or W/C > Bed: Supervision (verbal cues/safety issues);6: Sit > Supine: No assist  FIM - Locomotion: Wheelchair Distance: 150 Locomotion: Wheelchair: 5: Travels 150 ft or more: maneuvers on rugs and over door sills with supervision, cueing or coaxing FIM - Locomotion: Ambulation Locomotion: Ambulation Assistive Devices: Administrator Ambulation/Gait Assistance: 5: Supervision Locomotion: Ambulation: 5:  Travels 150 ft or more with supervision/safety issues  Comprehension Comprehension Mode: Auditory Comprehension: 6-Follows complex conversation/direction: With extra time/assistive device  Expression Expression Mode: Verbal Expression: 6-Expresses complex ideas: With extra time/assistive device  Social Interaction Social Interaction: 5-Interacts appropriately 90% of the time - Needs monitoring or encouragement for participation or interaction.  Problem Solving Problem Solving: 5-Solves complex 90% of the time/cues < 10% of the time  Memory Memory: 6-More than reasonable amt of time  Medical Problem List and Plan:  1. Multifactorial gait disorder related to diabetes mellitus peripheral neuropathy, osteoarthritis, orthostasis and multi-medical  2. DVT Prophylaxis/Anticoagulation: Chronic Coumadin therapy for atrial fibrillation. Monitor for any bleeding episodes. Latest INR 2. 48  3. Pain Management: Flexeril 10 mg each bedtime, Neurontin 100 mg 3 times a day, hydrocodone as needed. Monitor with increased mobility  4. Mood/depression. Zoloft 100 mg daily. Provide emotional support  5. Neuropsych: This patient is capable of making decisions on his own behalf.  6. End-stage renal disease. Continue hemodialysis as per renal services. Follow closely for activity tolerance as it pertains to volume status, effects of HD, etc  7. Diabetes mellitus with peripheral neuropathy. Hemoglobin A1c 6.7. NovoLog 4 units 3 times a day, Lantus insulin 15 units each bedtime. Check CBGs a.c. and at bedtime  8. Dizziness/Orthostasis. Midodrine 10 mg 3 times a day.last ortho vitals looked OK even though pt symptomatic, consider other sources will stop gabapentin-encourage adequate fluid intake  -TEDS extend to thigh hi, abd binder as needed.  9. Hyperlipidemia. Lipitor  10. Chronic anemia. Aranesp. Followup CBC with dialysis  11. GERD. Protonix  12.  Skin area ? Vasculitic change check sed rate , ask nephro  to eval  LOS (Days) 4 A FACE TO FACE EVALUATION WAS PERFORMED  KIRSTEINS,ANDREW E 06/04/2013, 6:00 AM

## 2013-06-04 NOTE — Progress Notes (Signed)
ANTICOAGULATION CONSULT NOTE - Follow-up Consult  Pharmacy Consult:  Coumadin Indication: atrial fibrillation  Allergies  Allergen Reactions  . Metformin And Related Other (See Comments)    Kidney problems    Labs:  Recent Labs  06/01/13 1723 06/01/13 1724 06/02/13 0600 06/03/13 0450 06/04/13 0515  HGB  --  7.9*  --   --   --   HCT  --  24.0*  --   --   --   PLT  --  338  --   --   --   LABPROT  --   --  24.5* 24.4* 22.9*  INR  --   --  2.29* 2.28* 2.10*  CREATININE 8.77*  --   --   --   --     Estimated Creatinine Clearance: 10.5 ml/min (by C-G formula based on Cr of 8.77).  Assessment:  47 YOM with history of Afib to continue on Coumadin from PTA. INR 2.1, remains therapeutic, but trending down slightly. No bleeding noted per review of chart.   PTA dose: 5mg  every day except on Wednesday and Sundays take 2.5mg    Goal of Therapy:  INR 2-3 Monitor platelets by anticoagulation protocol: Yes   Plan:  Coumadin 5 mg po x 1 tonight. F/u daily PT/INR  Maryanna Shape, PharmD, BCPS  Clinical Pharmacist  Pager: [phone]   06/04/2013 9:43 AM

## 2013-06-04 NOTE — Progress Notes (Signed)
Occupational Therapy Session Note  Patient Details  Name: Paul [name] MRN: [id number] Date of Birth: 31-Jul-1955  Today's Date: 06/04/2013 Time: 1020-1103 and [id number] Time Calculation (min): 43 min and 43 min   Short Term Goals: Week 1:  OT Short Term Goal 1 (Week 1): Focus on LTGs secondary short ELOS  Skilled Therapeutic Interventions/Progress Updates:    Session 1: Pt seen for ADL retraining with focus on standing balance, activity tolerance, and safety awareness when pt begins feeling "weak." Pt received sitting EOB. Retrieved clothing while in sitting for good energy conservation techniques. Pt ambulated to toilet with RW at supervision level and no c/o dizziness. Pt completed bathing with min cues for washing all body parts. Pt ambulated to room with RW at close supervision as pt reported slight dizziness and he immediately located chair. Dizziness quickly subsided. Pt completed dressing requiring assist for donning TED hose and abdominal binder. Pt then completed grooming tasks from w/c level for energy conservation. At end of session pt left sitting in recliner chair with all items in reach.   Session 2: Therapy session focused on UE strengthening in prep for sit<>stand and activity tolerance. Pt completed 3 sets x8-10 theraband exercises. Handout provided and min cues from therapist for technique. Pt required short rest breaks after every 2-3 exercises and longer breaks between each set of exercises. At end of session pt returned to room and left with all needs in reach.   Therapy Documentation Precautions:  Precautions Precautions: Fall Restrictions Weight Bearing Restrictions: No General:   Vital Signs:   Pain: No report of pain during therapy sessions.   See FIM for current functional status  Therapy/Group: Individual Therapy  Duayne Cal 06/04/2013, 12:16 PM

## 2013-06-05 ENCOUNTER — Inpatient Hospital Stay (HOSPITAL_COMMUNITY): Payer: Managed Care, Other (non HMO) | Admitting: *Deleted

## 2013-06-05 ENCOUNTER — Inpatient Hospital Stay (HOSPITAL_COMMUNITY): Payer: Managed Care, Other (non HMO) | Admitting: Physical Therapy

## 2013-06-05 LAB — RENAL FUNCTION PANEL
Albumin: 1.6 g/dL — ABNORMAL LOW (ref 3.5–5.2)
BUN: 50 mg/dL — ABNORMAL HIGH (ref 6–23)
CALCIUM: 8 mg/dL — AB (ref 8.4–10.5)
CO2: 26 meq/L (ref 19–32)
CREATININE: 6.04 mg/dL — AB (ref 0.50–1.35)
Chloride: 96 mEq/L (ref 96–112)
GFR calc Af Amer: 11 mL/min — ABNORMAL LOW (ref >90)
GFR calc non Af Amer: 9 mL/min — ABNORMAL LOW (ref >90)
GLUCOSE: 148 mg/dL — AB (ref 70–99)
PHOSPHORUS: 4.2 mg/dL (ref 2.3–4.6)
Potassium: 4.5 mEq/L (ref 3.7–5.3)
SODIUM: 137 meq/L (ref 137–147)

## 2013-06-05 LAB — CBC
HCT: 28.2 % — ABNORMAL LOW (ref 39.0–52.0)
HEMOGLOBIN: 8.6 g/dL — AB (ref 13.0–17.0)
MCH: 30 pg (ref 26.0–34.0)
MCHC: 30.5 g/dL (ref 30.0–36.0)
MCV: 98.3 fL (ref 78.0–100.0)
Platelets: 317 10*3/uL (ref 150–400)
RBC: 2.87 MIL/uL — AB (ref 4.22–5.81)
RDW: 14.9 % (ref 11.5–15.5)
WBC: 11.2 10*3/uL — ABNORMAL HIGH (ref 4.0–10.5)

## 2013-06-05 LAB — PROTIME-INR
INR: 2.43 — ABNORMAL HIGH (ref 0.00–1.49)
PROTHROMBIN TIME: 25.6 s — AB (ref 11.6–15.2)

## 2013-06-05 LAB — GLUCOSE, CAPILLARY
GLUCOSE-CAPILLARY: 175 mg/dL — AB (ref 70–99)
GLUCOSE-CAPILLARY: 78 mg/dL (ref 70–99)
GLUCOSE-CAPILLARY: 166 mg/dL — AB (ref 70–99)
Glucose-Capillary: 154 mg/dL — ABNORMAL HIGH (ref 70–99)
Glucose-Capillary: 136 mg/dL — ABNORMAL HIGH (ref 70–99)

## 2013-06-05 LAB — SEDIMENTATION RATE: Sed Rate: 75 mm/hr — ABNORMAL HIGH (ref 0–16)

## 2013-06-05 MED ORDER — WARFARIN SODIUM 2.5 MG PO TABS
2.5000 mg | ORAL_TABLET | Freq: Once | ORAL | Status: AC
  Administered 2013-06-05: 2.5 mg via ORAL
  Filled 2013-06-05: qty 1

## 2013-06-05 NOTE — Progress Notes (Signed)
Physical Therapy Session Note  Patient Details  Name: Paul [name] MRN: [id number] Date of Birth: [birth year]  Today's Date: 06/05/2013 Time: [id number] Time Calculation (min): 40 min  Short Term Goals: Week 1:  PT Short Term Goal 1 (Week 1): STG's = LTG's secondary to ELOS  Skilled Therapeutic Interventions/Progress Updates:  Pt was seen bedside in the pm. Pt required S for transfers with rolling walker or st cane. Pt ambulated 125 feet with rolling walker and S. Pt ascended/descended 4 stairs x 2 with R rail and st cane with S and verbal cues. Pt ambulated 230 feet with st cane and S to min guard, R knee buckle x 1, no LOB. Pt rode Nu step x 10 minutes at level 4.   Therapy Documentation Precautions:  Precautions Precautions: Fall Restrictions Weight Bearing Restrictions: No General:   Vital Signs:   Pain: Pt c/o 6/10 pain B feet.    Locomotion : Ambulation Ambulation/Gait Assistance: 5: Supervision   See FIM for current functional status  Therapy/Group: Individual Therapy  Dub Amis 06/05/2013, 2:32 PM

## 2013-06-05 NOTE — Progress Notes (Addendum)
58 y.o. right-handed male with history of atrial fibrillation on chronic Coumadin , end-stage renal disease secondary to diabetes mellitus on peritoneal dialysis at home. Patient was using a cane and walker prior to admission. Admitted 05/24/2013 with progressive weakness over several weeks as well as episode of fall when trying to get bed. Patient denies any head trauma although he had been having generalized headache since the fall. Cranial CT scan negative for any acute abnormalities. CT cervical spine with no fracture or malalignment. Blood pressure on admission 90/60 he did receive fluid bolus. Creatinine on admission of 10.02 and improved to 6.27. Followup renal services with hemodialysis ongoing.   Subjective/Complaints: Dizziness better off gabapentin but feet are now hurting, No SOB drop in sats to 91%   Review of Systems - Negative except weakness  Objective: Vital Signs: Blood pressure 125/65, pulse 65, temperature 97.6 F (36.4 C), temperature source Oral, resp. rate 16, height $RemoveBe'6\' 2"'TZcvdzFMY$  (1.88 m), weight 82.5 kg (181 lb 14.1 oz), SpO2 99.00%. No results found. Results for orders placed during the hospital encounter of 05/31/13 (from the past 72 hour(s))  GLUCOSE, CAPILLARY     Status: Abnormal   Collection Time    06/02/13 12:09 PM      Result Value Range   Glucose-Capillary 253 (*) 70 - 99 mg/dL  GLUCOSE, CAPILLARY     Status: Abnormal   Collection Time    06/02/13  5:08 PM      Result Value Range   Glucose-Capillary 146 (*) 70 - 99 mg/dL   Comment 1 Notify RN    GLUCOSE, CAPILLARY     Status: None   Collection Time    06/02/13  9:29 PM      Result Value Range   Glucose-Capillary 98  70 - 99 mg/dL  GLUCOSE, CAPILLARY     Status: None   Collection Time    06/03/13  2:45 AM      Result Value Range   Glucose-Capillary 85  70 - 99 mg/dL  PROTIME-INR     Status: Abnormal   Collection Time    06/03/13  4:50 AM      Result Value Range   Prothrombin Time 24.4 (*) 11.6 - 15.2  seconds   INR 2.28 (*) 0.00 - 1.49  GLUCOSE, CAPILLARY     Status: None   Collection Time    06/03/13  7:34 AM      Result Value Range   Glucose-Capillary 75  70 - 99 mg/dL   Comment 1 Notify RN    GLUCOSE, CAPILLARY     Status: Abnormal   Collection Time    06/03/13 11:20 AM      Result Value Range   Glucose-Capillary 175 (*) 70 - 99 mg/dL   Comment 1 Notify RN    GLUCOSE, CAPILLARY     Status: None   Collection Time    06/03/13 10:13 PM      Result Value Range   Glucose-Capillary 97  70 - 99 mg/dL  GLUCOSE, CAPILLARY     Status: Abnormal   Collection Time    06/04/13  3:12 AM      Result Value Range   Glucose-Capillary 207 (*) 70 - 99 mg/dL  PROTIME-INR     Status: Abnormal   Collection Time    06/04/13  5:15 AM      Result Value Range   Prothrombin Time 22.9 (*) 11.6 - 15.2 seconds   INR 2.10 (*) 0.00 - 1.49  GLUCOSE,  CAPILLARY     Status: Abnormal   Collection Time    06/04/13  6:01 AM      Result Value Range   Glucose-Capillary 150 (*) 70 - 99 mg/dL  GLUCOSE, CAPILLARY     Status: Abnormal   Collection Time    06/04/13  8:14 AM      Result Value Range   Glucose-Capillary 134 (*) 70 - 99 mg/dL  GLUCOSE, CAPILLARY     Status: Abnormal   Collection Time    06/04/13 11:48 AM      Result Value Range   Glucose-Capillary 116 (*) 70 - 99 mg/dL  GLUCOSE, CAPILLARY     Status: Abnormal   Collection Time    06/04/13  4:36 PM      Result Value Range   Glucose-Capillary 246 (*) 70 - 99 mg/dL  GLUCOSE, CAPILLARY     Status: Abnormal   Collection Time    06/04/13  9:23 PM      Result Value Range   Glucose-Capillary 111 (*) 70 - 99 mg/dL  GLUCOSE, CAPILLARY     Status: Abnormal   Collection Time    06/05/13  2:57 AM      Result Value Range   Glucose-Capillary 154 (*) 70 - 99 mg/dL  PROTIME-INR     Status: Abnormal   Collection Time    06/05/13  5:15 AM      Result Value Range   Prothrombin Time 25.6 (*) 11.6 - 15.2 seconds   INR 2.43 (*) 0.00 - 1.49   SEDIMENTATION RATE     Status: Abnormal   Collection Time    06/05/13  5:15 AM      Result Value Range   Sed Rate 75 (*) 0 - 16 mm/hr  CBC     Status: Abnormal   Collection Time    06/05/13  5:15 AM      Result Value Range   WBC 11.2 (*) 4.0 - 10.5 K/uL   RBC 2.87 (*) 4.22 - 5.81 MIL/uL   Hemoglobin 8.6 (*) 13.0 - 17.0 g/dL   HCT 28.2 (*) 39.0 - 52.0 %   MCV 98.3  78.0 - 100.0 fL   MCH 30.0  26.0 - 34.0 pg   MCHC 30.5  30.0 - 36.0 g/dL   RDW 14.9  11.5 - 15.5 %   Platelets 317  150 - 400 K/uL  RENAL FUNCTION PANEL     Status: Abnormal   Collection Time    06/05/13  5:15 AM      Result Value Range   Sodium 137  137 - 147 mEq/L   Potassium 4.5  3.7 - 5.3 mEq/L   Chloride 96  96 - 112 mEq/L   CO2 26  19 - 32 mEq/L   Glucose, Bld 148 (*) 70 - 99 mg/dL   BUN 50 (*) 6 - 23 mg/dL   Creatinine, Ser 6.04 (*) 0.50 - 1.35 mg/dL   Calcium 8.0 (*) 8.4 - 10.5 mg/dL   Phosphorus 4.2  2.3 - 4.6 mg/dL   Albumin 1.6 (*) 3.5 - 5.2 g/dL   GFR calc non Af Amer 9 (*) >90 mL/min   GFR calc Af Amer 11 (*) >90 mL/min   Comment: (NOTE)     The eGFR has been calculated using the CKD EPI equation.     This calculation has not been validated in all clinical situations.     eGFR's persistently <90 mL/min signify possible Chronic Kidney  Disease.  GLUCOSE, CAPILLARY     Status: Abnormal   Collection Time    06/05/13  7:52 AM      Result Value Range   Glucose-Capillary 136 (*) 70 - 99 mg/dL     HEENT: normal Cardio: RRR and no murmur Resp: CTA B/L and unlabored GI: BS positive and non tender,ND, Abd binder Extremity:  Pulses negative and No Edema Skin:  Erythema at ankles with dry necrotic area no drainage,non tender Neuro: Alert/Oriented, Cranial Nerve II-XII normal, Abnormal Sensory reduced sensory R great toe, left little toe and Abnormal Motor 4/5 Bilateral Deltoidf , bi,tri, grip, HF, KE, ADF, 3-EHL Musc/Skel:  Normal Gen NAD   Assessment/Plan: 1. Functional deficits secondary to  Deconditioning which require 3+ hours per day of interdisciplinary therapy in a comprehensive inpatient rehab setting. Physiatrist is providing close team supervision and 24 hour management of active medical problems listed below. Physiatrist and rehab team continue to assess barriers to discharge/monitor patient progress toward functional and medical goals.  FIM: FIM - Bathing Bathing Steps Patient Completed: Chest;Front perineal area;Right lower leg (including foot);Left lower leg (including foot);Buttocks;Right Arm;Left Arm;Right upper leg;Left upper leg;Abdomen Bathing: 5: Supervision: Safety issues/verbal cues (verbal cues)  FIM - Upper Body Dressing/Undressing Upper body dressing/undressing steps patient completed: Thread/unthread right sleeve of pullover shirt/dresss;Thread/unthread left sleeve of pullover shirt/dress;Put head through opening of pull over shirt/dress;Pull shirt over trunk Upper body dressing/undressing: 5: Set-up assist to: Obtain clothing/put away FIM - Lower Body Dressing/Undressing Lower body dressing/undressing steps patient completed: Thread/unthread right underwear leg;Thread/unthread left underwear leg;Pull underwear up/down;Thread/unthread right pants leg;Pull pants up/down;Thread/unthread left pants leg;Don/Doff left sock;Don/Doff right sock Lower body dressing/undressing: 5: Set-up assist to: Don/Doff TED stocking  FIM - Toileting Toileting steps completed by patient: Adjust clothing prior to toileting;Adjust clothing after toileting;Performs perineal hygiene Toileting Assistive Devices: Grab bar or rail for support Toileting: 5: Supervision: Safety issues/verbal cues  FIM - Radio producer Devices: Insurance account manager Transfers: 5-To toilet/BSC: Supervision (verbal cues/safety issues)  FIM - Control and instrumentation engineer Devices: Arm rests Bed/Chair Transfer: 6: Supine > Sit: No assist;6: Sit > Supine: No assist;5:  Bed > Chair or W/C: Supervision (verbal cues/safety issues);5: Chair or W/C > Bed: Supervision (verbal cues/safety issues)  FIM - Locomotion: Wheelchair Distance: 150 Locomotion: Wheelchair: 5: Travels 150 ft or more: maneuvers on rugs and over door sills with supervision, cueing or coaxing FIM - Locomotion: Ambulation Locomotion: Ambulation Assistive Devices: Administrator Ambulation/Gait Assistance: 5: Supervision Locomotion: Ambulation: 5: Travels 150 ft or more with supervision/safety issues  Comprehension Comprehension Mode: Auditory Comprehension: 6-Follows complex conversation/direction: With extra time/assistive device  Expression Expression Mode: Verbal Expression: 6-Expresses complex ideas: With extra time/assistive device  Social Interaction Social Interaction: 5-Interacts appropriately 90% of the time - Needs monitoring or encouragement for participation or interaction.  Problem Solving Problem Solving: 5-Solves complex 90% of the time/cues < 10% of the time  Memory Memory: 6-More than reasonable amt of time  Medical Problem List and Plan:  1. Multifactorial gait disorder related to diabetes mellitus peripheral neuropathy, osteoarthritis, orthostasis and multi-medical  2. DVT Prophylaxis/Anticoagulation: Chronic Coumadin therapy for atrial fibrillation. Monitor for any bleeding episodes. Latest INR 2. 48  3. Pain Management: Flexeril 10 mg each bedtime, Resume Neurontin bit instead of 100 mg 3 times a day,try $RemoveB'300mg'EvKHvgwr$  qhs hydrocodone as needed. Monitor with increased mobility  4. Mood/depression. Zoloft 100 mg daily. Provide emotional support  5. Neuropsych: This patient is capable of making decisions  on his own behalf.  6. End-stage renal disease. Continue hemodialysis as per renal services. Follow closely for activity tolerance as it pertains to volume status, effects of HD, etc  7. Diabetes mellitus with peripheral neuropathy. Hemoglobin A1c 6.7. NovoLog 4 units 3  times a day, Lantus insulin 15 units each bedtime. Check CBGs a.c. and at bedtime  8. Dizziness/Orthostasis. Midodrine 10 mg 3 times a day.last ortho vitals looked OK even though pt symptomatic, consider other sources will stop gabapentin-encourage adequate fluid intake  -TEDS extend to thigh hi, abd binder as needed.  9. Hyperlipidemia. Lipitor  10. Chronic anemia. Aranesp. Followup CBC with dialysis  11. GERD. Protonix  12.  Skin area ? Vasculitic change check sed rate , ask nephro to eval  LOS (Days) 5 A FACE TO FACE EVALUATION WAS PERFORMED  Jonnie Kubly E 06/05/2013, 10:14 AM

## 2013-06-05 NOTE — Progress Notes (Signed)
Physical Therapy Session Note  Patient Details  Name: Paul [name] MRN: [id number] Date of Birth: 07/22/1955  Today's Date: 06/05/2013 Time: [id number] Time Calculation (min): 41 min  Short Term Goals: Week 1:  PT Short Term Goal 1 (Week 1): STG's = LTG's secondary to ELOS  Skilled Therapeutic Interventions/Progress Updates:  Pt was seen bedside in the am. Pt able to maneuver throughout room with rolling walker and S. Pt able to doff shorts and don pants with min guard. Pt propelled w/c to gym for LE strengthening. Pt ambulated 230 feet and 115 feet with rolling walker and S. Pt ambulated 230 feet x 2 with st cane and S to min guard, wide BOS and decreased cadence noted with cane. Performed cone taps for LE strengthening.   Therapy Documentation Precautions:  Precautions Precautions: Fall Restrictions Weight Bearing Restrictions: No General:   Pain: Pt c/o 5/10 pain B feet.    Locomotion : Ambulation Ambulation/Gait Assistance: 5: Supervision   See FIM for current functional status  Therapy/Group: Individual Therapy  Dub Amis 06/05/2013, 11:40 AM

## 2013-06-05 NOTE — Progress Notes (Signed)
OCCUPATIONAL Therapy Note  Patient Details  Name: Paul [name] MRN: [id number] Date of Birth: 03-Oct-1955 Today's Date: 06/05/2013 Time:  1400-1500  (60 min) Pain:  3/10  Foot pain; 4/10  Back after activity Group session:   1st session   1st session:Pt engaged in therapeutic activities in standing, functional mobility, and balance activities.  Pt. ambulatd to gym.  Did balance activities and reaching down to floor with horse shoes .  Engaged in activities to facilitate head turning.  Pt had light headedness 3/10 but no dizziness or diplopia.  His legs felt weak after 3 round of horse shoes.  He was able to ambulate back to room    2nd session:  Time:  1530-1600  30 min Pain:Bilateral foot pain= 3/10  Individual session  2nd session:  Pt. Engaged in functional mobility.  Ambulated from room to gym with RW and minimal to SBA.  Engaged in kitchen activity for balance, endurance, strength.  Pt. Stood for 6 minutes and mixed items for Stroke Support Group party.  He then asked to sit.  Brought high stool for him to sit.  Sat for about 6 minutes while putting items on waxed paper.  Stood another 6 -7 minutes.  Needed minimal assist with UE strength to stir hardened oatmeal bars.  Pt. Ambulated back to room and left in recliner with call bell in reach.  Wife present during session. Wife concerned about pt's dizziness and wants it to be better before he goes home.  Will convey to team.       Lisa Roca 06/05/2013, 5:13 PM

## 2013-06-05 NOTE — Progress Notes (Signed)
Subjective:  Complains of bilateral foot pain, believed to be neuropathy; otherwise, feeling well.  Objective: Vital signs in last 24 hours: Temp:  [97.6 F (36.4 C)-98.3 F (36.8 C)] 97.6 F (36.4 C) (01/17 0558) Pulse Rate:  [65-72] 65 (01/17 0558) Resp:  [16-18] 16 (01/17 0558) BP: (119-125)/(65-66) 125/65 mmHg (01/17 0558) SpO2:  [99 %] 99 % (01/17 0558) Weight change:   Intake/Output from previous day: 01/16 0701 - 01/17 0700 In: 8 [P.O.:50] Out: -    Lab Results:  Recent Labs  06/05/13 0515  WBC 11.2*  HGB 8.6*  HCT 28.2*  PLT 317   BMET:  Recent Labs  06/05/13 0515  NA 137  K 4.5  CL 96  CO2 26  GLUCOSE 148*  BUN 50*  CREATININE 6.04*  CALCIUM 8.0*  ALBUMIN 1.6*   No results found for this basename: PTH,  in the last 72 hours Iron Studies: No results found for this basename: IRON, TIBC, TRANSFERRIN, FERRITIN,  in the last 72 hours  EXAM:  Gen: Alert, in no apparent distress  Resp: CTA without rales, rhonchi, or wheezes  Cardio: RRR without murmur or rub  GI: + BS, soft and nontender  Extremities: No edema  Access: AVF @ RFA with + bruit   Dialysis: was on PD, EDW was 85kg, PD was 6 exchanges/night of 3 liters; last fill 2500, pause 2500; all 2.5%)  Assessment/Plan: 1. Debility - in rehab; improving with anticipated discharge 1/20.  2. Weakness/falls - was doing poorly on PD, switched to HD with improvement.  3. ESRD - previously on PD, switched to HD on TTS @ [facility] per AVF; flush PD catheter weekly, on Coumadin with tight Heparin PRN per HD.  4. Chronic HypOtension/Volume - BP 125/65 on Midodrine 10 mg tid; last wt down to 82.5 kg s/p net UF 3 L on 1/15.  5. Anemia - Hgb up to 8.6, on Aranesp 200 mcg on Tues, IV Fe per HD.  6. Sec HPT - Ca 8 (9.3 corrected), P 4.2; Vitamin D PO, Phoslo 3 with meals.  7. Nutrition - Alb 1.6, renal diet, vitamin.  8. DM - on Insulin. A-fib - on Amiodarone & Coumadin.  9. Neuropathy/Depression - on Zoloft, Neurontin  on hold.      LOS: 5 days   LYLES,CHARLES 06/05/2013,8:38 AM  I have seen and examined patient, discussed with PA and agree with assessment and plan as outlined above. Kelly Splinter MD pager [phone]    cell [phone] 06/05/2013, 10:31 AM

## 2013-06-06 LAB — PROTIME-INR
INR: 2.69 — ABNORMAL HIGH (ref 0.00–1.49)
Prothrombin Time: 27.7 seconds — ABNORMAL HIGH (ref 11.6–15.2)

## 2013-06-06 LAB — GLUCOSE, CAPILLARY
GLUCOSE-CAPILLARY: 171 mg/dL — AB (ref 70–99)
Glucose-Capillary: 111 mg/dL — ABNORMAL HIGH (ref 70–99)
Glucose-Capillary: 147 mg/dL — ABNORMAL HIGH (ref 70–99)
Glucose-Capillary: 93 mg/dL (ref 70–99)

## 2013-06-06 MED ORDER — GABAPENTIN 600 MG PO TABS
300.0000 mg | ORAL_TABLET | Freq: Every day | ORAL | Status: DC
  Filled 2013-06-06: qty 0.5

## 2013-06-06 MED ORDER — DELFLEX-LM/1.5% DEXTROSE 346 MOSM/L IP SOLN: Freq: Once | INTRAPERITONEAL | Status: DC

## 2013-06-06 MED ORDER — GABAPENTIN 300 MG PO CAPS
300.0000 mg | ORAL_CAPSULE | Freq: Every day | ORAL | Status: DC
  Administered 2013-06-06 – 2013-06-10 (×5): 300 mg via ORAL
  Filled 2013-06-06 (×6): qty 1

## 2013-06-06 MED ORDER — WARFARIN SODIUM 2 MG PO TABS
2.0000 mg | ORAL_TABLET | Freq: Once | ORAL | Status: AC
  Administered 2013-06-06: 2 mg via ORAL
  Filled 2013-06-06: qty 1

## 2013-06-06 NOTE — Progress Notes (Signed)
Subjective:  No complaints, no further dizziness with standing or ambulating  Objective: Vital signs in last 24 hours: Temp:  [97.8 F (36.6 C)-98.6 F (37 C)] 98.6 F (37 C) (01/18 0626) Pulse Rate:  [62-100] 100 (01/18 0700) Resp:  [17-20] 18 (01/18 0626) BP: (76-133)/(48-76) 76/49 mmHg (01/18 0700) SpO2:  [93 %-100 %] 99 % (01/18 0626) Weight:  [82.6 kg (182 lb 1.6 oz)-85.4 kg (188 lb 4.4 oz)] 83.8 kg (184 lb 11.9 oz) (01/18 0641) Weight change:   Intake/Output from previous day: 01/17 0701 - 01/18 0700 In: 960 [P.O.:960] Out: 3100  Intake/Output this shift: Total I/O In: 120 [P.O.:120] Out: -   Lab Results:  Recent Labs  06/05/13 0515  WBC 11.2*  HGB 8.6*  HCT 28.2*  PLT 317   BMET:  Recent Labs  06/05/13 0515  NA 137  K 4.5  CL 96  CO2 26  GLUCOSE 148*  BUN 50*  CREATININE 6.04*  CALCIUM 8.0*  ALBUMIN 1.6*   No results found for this basename: PTH,  in the last 72 hours Iron Studies: No results found for this basename: IRON, TIBC, TRANSFERRIN, FERRITIN,  in the last 72 hours  EXAM:  Gen: Alert, in no apparent distress  Resp: CTA without rales, rhonchi, or wheezes  Cardio: RRR without murmur or rub  GI: + BS, soft and nontender  Extremities: No edema  Access: AVF @ RFA with + bruit   Dialysis: was on PD, EDW was 85kg, PD was 6 exchanges/night of 3 liters; last fill 2500, pause 2500; all 2.5%)  Assessment/Plan: 1. Debility - in rehab; improving with anticipated discharge 1/20.  2. Weakness/falls - was doing poorly on PD, switched to HD with improvement.  3. ESRD - previously on PD, switched to HD on TTS @ [facility] per AVF; flush PD catheter weekly, on Coumadin with tight Heparin PRN per HD.  4. Chronic Hypotension/Volume - BP 76/49 this AM, asymptomatic, on Midodrine 10 mg tid; wt 83.8 kg s/p net UF 3.1 L yesterday.  5. Anemia - Hgb up to 8.6, on Aranesp 200 mcg on Tues, IV Fe per HD.  6. Sec HPT - Ca 8 (9.3 corrected), P 4.2; Vitamin D PO, Phoslo 3  with meals.  7. Nutrition - Alb 1.6, renal diet, vitamin.  8. DM - on Insulin.  9. A-fib - on Amiodarone & Coumadin.  10. Neuropathy/Depression - on Zoloft, Neurontin on hold.      LOS: 6 days   LYLES,CHARLES 06/06/2013,9:46 AM  I have seen and examined patient, discussed with PA and agree with assessment and plan as outlined above. Kelly Splinter MD pager [phone]    cell [phone] 06/06/2013, 11:50 AM

## 2013-06-06 NOTE — Progress Notes (Signed)
PD cath flushed  X 3 with 500cc of 1.5% low mag/low cal dialysate each flush.

## 2013-06-06 NOTE — Progress Notes (Signed)
ANTICOAGULATION CONSULT NOTE - Follow-up Consult  Pharmacy Consult:  Coumadin Indication: atrial fibrillation  Allergies  Allergen Reactions  . Metformin And Related Other (See Comments)    Kidney problems    Labs:  Recent Labs  06/04/13 0515 06/05/13 0515 06/06/13 0628  HGB  --  8.6*  --   HCT  --  28.2*  --   PLT  --  317  --   LABPROT 22.9* 25.6* 27.7*  INR 2.10* 2.43* 2.69*  CREATININE  --  6.04*  --     Estimated Creatinine Clearance: 15.3 ml/min (by C-G formula based on Cr of 6.04).  Assessment:  77 YOM with history of Afib to continue on Coumadin from [facility]. INR 2.69. No bleeding noted per review of chart.   PTA dose: 5mg  every day except on Wednesday and Sundays take 2.5mg    Goal of Therapy:  INR 2-3 Monitor platelets by anticoagulation protocol: Yes   Plan:  Coumadin 2 mg po x 1 tonight. F/u daily PT/INR  Excell Seltzer, PharmD Clinical Pharmacist  Pager: [phone] 06/06/2013 8:01 AM

## 2013-06-07 ENCOUNTER — Inpatient Hospital Stay (HOSPITAL_COMMUNITY): Payer: Managed Care, Other (non HMO)

## 2013-06-07 ENCOUNTER — Inpatient Hospital Stay (HOSPITAL_COMMUNITY): Payer: Managed Care, Other (non HMO) | Admitting: Physical Therapy

## 2013-06-07 ENCOUNTER — Encounter (HOSPITAL_COMMUNITY): Payer: Managed Care, Other (non HMO)

## 2013-06-07 DIAGNOSIS — R21 Rash and other nonspecific skin eruption: Secondary | ICD-10-CM

## 2013-06-07 DIAGNOSIS — R5381 Other malaise: Secondary | ICD-10-CM

## 2013-06-07 DIAGNOSIS — N186 End stage renal disease: Secondary | ICD-10-CM

## 2013-06-07 DIAGNOSIS — F329 Major depressive disorder, single episode, unspecified: Secondary | ICD-10-CM

## 2013-06-07 DIAGNOSIS — F3289 Other specified depressive episodes: Secondary | ICD-10-CM

## 2013-06-07 DIAGNOSIS — I951 Orthostatic hypotension: Secondary | ICD-10-CM

## 2013-06-07 LAB — GLUCOSE, CAPILLARY
GLUCOSE-CAPILLARY: 266 mg/dL — AB (ref 70–99)
GLUCOSE-CAPILLARY: 171 mg/dL — AB (ref 70–99)
Glucose-Capillary: 61 mg/dL — ABNORMAL LOW (ref 70–99)
Glucose-Capillary: 68 mg/dL — ABNORMAL LOW (ref 70–99)
Glucose-Capillary: 88 mg/dL (ref 70–99)
Glucose-Capillary: 129 mg/dL — ABNORMAL HIGH (ref 70–99)

## 2013-06-07 LAB — PROTIME-INR
INR: 2.82 — AB (ref 0.00–1.49)
Prothrombin Time: 28.7 seconds — ABNORMAL HIGH (ref 11.6–15.2)

## 2013-06-07 MED ORDER — INSULIN GLARGINE 100 UNIT/ML ~~LOC~~ SOLN
5.0000 [IU] | Freq: Every day | SUBCUTANEOUS | Status: AC
  Administered 2013-06-07: 5 [IU] via SUBCUTANEOUS
  Filled 2013-06-07: qty 0.05

## 2013-06-07 MED ORDER — WARFARIN SODIUM 1 MG PO TABS
1.0000 mg | ORAL_TABLET | Freq: Once | ORAL | Status: AC
  Administered 2013-06-07: 1 mg via ORAL
  Filled 2013-06-07: qty 1

## 2013-06-07 MED ORDER — PRO-STAT SUGAR FREE PO LIQD
30.0000 mL | Freq: Two times a day (BID) | ORAL | Status: DC
  Administered 2013-06-07 – 2013-06-11 (×6): 30 mL via ORAL
  Filled 2013-06-07 (×10): qty 30

## 2013-06-07 NOTE — Progress Notes (Signed)
Physical Therapy Session Note  Patient Details  Name: Paul [name] MRN: [id number] Date of Birth: [birth year]  Today's Date: 06/07/2013 Time: 0900-0954 Time Calculation (min): 54 min  Short Term Goals: Week 1:  PT Short Term Goal 1 (Week 1): STG's = LTG's secondary to ELOS  Skilled Therapeutic Interventions/Progress Updates:   Pt received seated in bedside chair with wife present. Pt agreeable to therapy. Session focused on initiation of family training, increasing pt safety/independence with functional mobility. PT educated pt/wife that pt will not require physical assist at discharge, as he is planned to discharge at modified independnet level. However, wife educated on providing supervision functional mobility in case pt is significantly fatigued after hemodialysis, continues to have orthostatic hypotension, or has future episode of functional decline. Wife in agreement.  Therapist demonstrated appropriate supervision/cueing with the following aspects of functional mobility: gait  x125' in controlled and home environments with rolling walker; stand pivot transfers from bed<>w/c<>couch<>bed (apartment) with rolling walker; negotiation of 3 stairs with R rail, sideways, step-to pattern. Wife verbalized understanding and gave effective return demonstration of supervision/cueing of all abovementioned aspects of functional mobility.   Pt performed supine<>sit with mod I (increased time) on apartment bed. W/c mobility x100' in controlled and home environments using bilat LE's with mod I. During gait, R Trendelenburg gait pattern noted. Therapist educated pt on standing mini squats, hip abduction with bilat UE support at rolling walker. Pt with single episode of dizziness and orthostatic hypotension, both of which resolved within 30 seconds of sitting. See vital signs for detailed findings. Therapist departed with pt seated in bedside chair with wife present and all needs within reach.  Therapy  Documentation Precautions:  Precautions Precautions: Fall Restrictions Weight Bearing Restrictions: No Pain: Pt denies pain during session.  See FIM for current functional status  Therapy/Group: Individual Therapy  Sharay Bellissimo, Malva Cogan 06/07/2013, 5:50 PM

## 2013-06-07 NOTE — Consult Note (Signed)
WOC wound consult note Reason for Consult: evaluation of LE wounds.  Pt does not recall any ulcerations in the past.  He does report some discoloration in the LE but not the same as today.  Does not report any ulcerations prior to his rehab admission.  He now presents with ulcerations bilaterally.  Wound type: unclear etiology, 100% necrotic ulcer pretibial/dorsal foot/ankle.  100% necrotic ulcer left great toe plantar surface.  Feet are mottled bilaterally, cool to the touch. Doppler pulses bilaterally.  Purple in color between his toes bilaterally. Erythema  surrounding both ulcerations on the legs.  Measurement: Right: 3.0cm x 2.0cm x 0 Left 4.0cm x 2.5cm x 0 Great toe left: 2.0cm x 2.0cm x0 Right medial heel: 2.0cm x 3.5cm x 0.2 Wound bed: all are dark eschar, the great toe more maroon less eschar. Right medial ankle has some linear tissue opening, may have been bulla and now skin flap reaproximated  Drainage (amount, consistency, odor) minimal from the right medial heel, sanguineous drainage noted on TED Periwound: mottled, and erythema  Dressing procedure/placement/frequency: no topical care for now.  Contacted the PA with rehab, requested ABI's to help determine the etiology of these ulcerations prior to treatment.   Will follow along with you to review the ABI results, the discuss POC further.  Eilis Chestnutt [place] RN,CWOCN [id number]

## 2013-06-07 NOTE — Progress Notes (Signed)
[facility] Progress Note  Subjective:   New sores on lower legs - sores don't hurt but feet do.  Objective Filed Vitals:   06/06/13 1526 06/07/13 0519 06/07/13 0521 06/07/13 0523  BP: 120/64 129/66 106/64 85/46  Pulse: 100 59 72 93  Temp: 98.6 F (37 C) 97.2 F (36.2 C)    TempSrc: Oral Oral    Resp: 18 18 19 20   Height:      Weight:      SpO2: 100% 99% 100% 98%   Physical Exam General: NAD sitting working on puzzle Heart: RRR Lungs: crackles at bases Abdomen: wearing binder Extremities:+ LE edema wearing support stockings; scabbed areas visible at ankles anteriorally but no drainage. Dialysis Access: R AVF + bruit  Assessment/Plan: 1. Debility - in rehab; improving with anticipated discharge 1/20.  2. Weakness/falls - was doing poorly on PD, switched to HD with improvement.  3. ESRD - previously on PD, switched to HD on TTS 2 @ [facility] per AVF; flush PD catheter weekly, on Coumadin with tight Heparin PRN per HD.  4. Chronic Hypotension/Volume - variable, asymptomatic, on Midodrine 10 mg tid; kg s/p net UF 3.1 L Saturday with post weight of  82.6; has some lower extrem edema; titrate edw down gradually 5. Anemia - Hgb up to 8.6, on Aranesp 200 mcg on Tues, IV Fe (125/x/5) per HD; 33% sat 1/08 6. Sec HPT - Ca 8 (9.3 corrected), P 4.2; Vitamin D PO, Phoslo 3 with meals. - had been on 0.25 calcitriol prior to admission - recheck iPTH with next monthly labs at outpt HD and decide how to dose Hectorol then. 7. Nutrition - Alb 1.6, renal diet, vitamin. - add prostat 8. DM - on Insulin.  9. A-fib - on Amiodarone & Coumadin.  10. Neuropathy/Depression - on Zoloft, Neurontin on hold.  11. Disp - possible D/c Thursday and would need to go to [facility] on Friday to sign papers. Myriam Jacobson, PA-C [facility] Beeper [phone] 06/07/2013,9:28 AM  LOS: 7 days    Additional Objective Labs: Basic Metabolic Panel:  Recent Labs Lab 06/01/13 1723 06/05/13 0515   NA 132* 137  K 4.8 4.5  CL 91* 96  CO2 20 26  GLUCOSE 197* 148*  BUN 89* 50*  CREATININE 8.77* 6.04*  CALCIUM 7.4* 8.0*  PHOS 5.6* 4.2   Liver Function Tests:  Recent Labs Lab 06/01/13 1723 06/05/13 0515  ALBUMIN 1.5* 1.6*   CBC:  Recent Labs Lab 06/01/13 1724 06/05/13 0515  WBC 12.5* 11.2*  HGB 7.9* 8.6*  HCT 24.0* 28.2*  MCV 94.1 98.3  PLT 338 317   CBG:  Recent Labs Lab 06/06/13 1640 06/06/13 2117 06/07/13 0658 06/07/13 0728 06/07/13 0755  GLUCAP 147* 93 61* 68* 88  Medications:   . aspirin EC  81 mg Oral Daily  . atorvastatin  10 mg Oral Daily  . calcium acetate  2,001 mg Oral TID WC  . cholecalciferol  1,000 Units Oral Daily  . cyclobenzaprine  10 mg Oral QHS  . darbepoetin (ARANESP) injection - DIALYSIS  200 mcg Intravenous Q Tue-HD  . feeding supplement (RESOURCE BREEZE)  1 Container Oral TID WC  . ferric gluconate (FERRLECIT/NULECIT) IV  125 mg Intravenous Q T,Th,Sa-HD  . gabapentin  300 mg Oral QHS  . insulin aspart  0-15 Units Subcutaneous TID WC  . insulin glargine  5 Units Subcutaneous Daily  . midodrine  10 mg Oral TID WC  . multivitamin  1 tablet  Oral QHS  . pantoprazole  40 mg Oral Daily  . sertraline  100 mg Oral Daily  . warfarin  1 mg Oral ONCE-1800  . Warfarin - Pharmacist Dosing Inpatient   Does not apply [phone]

## 2013-06-07 NOTE — Progress Notes (Signed)
Physical Therapy Note  Patient Details  Name: Paul [name] MRN: [id number] Date of Birth: [birth year] Today's Date: 06/07/2013  Time: [id number] 28 minutes  1:1 no c/o pain.  No c/o dizziness with ted hose and abdominal binder donned.  Gait with RW with supervision with 1 knee buckling, pt able to self correct LOB with RW.  Standing balance/strengthening with step ups forward and laterally, tap ups forward and laterally with min A for balance without AD with increased weakness on L.  Mini squats with pt fatiguing quickly after 6-7 reps requires seated rest.   [name] 06/07/2013, 8:29 AM

## 2013-06-07 NOTE — Significant Event (Signed)
Hypoglycemic Event  CBG: 68  Treatment: 15 GM carbohydrate snack  Symptoms: None  Follow-up CBG: Time:0720 CBG Result:68  Possible Reasons for Event: Unknown  Comments/MD notified: Dr. Garwin Brothers, Silvana Newness D  Remember to initiate Hypoglycemia Order Set & complete

## 2013-06-07 NOTE — Progress Notes (Signed)
ANTICOAGULATION CONSULT NOTE - Follow-up Consult  Pharmacy Consult:  Coumadin Indication: atrial fibrillation  Allergies  Allergen Reactions  . Metformin And Related Other (See Comments)    Kidney problems    Labs:  Recent Labs  06/05/13 0515 06/06/13 0628 06/07/13 0239  HGB 8.6*  --   --   HCT 28.2*  --   --   PLT 317  --   --   LABPROT 25.6* 27.7* 28.7*  INR 2.43* 2.69* 2.82*  CREATININE 6.04*  --   --     Estimated Creatinine Clearance: 15.3 ml/min (by C-G formula based on Cr of 6.04).  Assessment:  73 YOM with history of Afib to continue on Coumadin from PTA. INR 2.69. No bleeding noted per review of chart.   PTA dose: 5mg  every day except on Wednesday and Sundays take 2.5mg    Goal of Therapy:  INR 2-3 Monitor platelets by anticoagulation protocol: Yes   Plan:  Coumadin 1 mg po x 1 tonight. F/u daily PT/INR  Maryanna Shape, PharmD, BCPS  Clinical Pharmacist  Pager: [phone]  06/07/2013 8:50 AM

## 2013-06-07 NOTE — Progress Notes (Signed)
Hypoglycemic Event  CBG: 68    Treatment: 15 GM carbohydrate snack  Symptoms: None  Follow-up CBG: Time:0750 CBG Result:88 Possible Reasons for Event: Unknown  Comments/MD notified: Dr. Letta Pate notified, orders received    Tonita Cong  Remember to initiate Hypoglycemia Order Set & complete

## 2013-06-07 NOTE — Progress Notes (Signed)
Social Work Patient ID: Paul [name], male   DOB: May 12, 1956, 58 y.o.   MRN: [id number] Met with pt and wife to inform of delay in discharge until Thursday due to medical issues.  Wife feels this will give her two more days to prepare the home. She has been here to learn his care and is going to get a tub seat this afternoon, aware this is not covered.  Other tow items rolling walker and bedside commode are being Delivered to room today or tomorrow.  Work toward discharge Thursday.

## 2013-06-07 NOTE — Significant Event (Signed)
Hypoglycemic Event  CBG:61  Treatment: 15 GM carbohydrate snack  Symptoms: None  Follow-up CBG: Time: 0720 CBG Result:68  Possible Reasons for Event: Unknown  Comments/MD notified:         Richarda Osmond D  Remember to initiate Hypoglycemia Order Set & complete

## 2013-06-07 NOTE — Progress Notes (Addendum)
59 y.o. right-handed male with history of atrial fibrillation on chronic Coumadin , end-stage renal disease secondary to diabetes mellitus on peritoneal dialysis at home. Patient was using a cane and walker prior to admission. Admitted 05/24/2013 with progressive weakness over several weeks as well as episode of fall when trying to get bed. Patient denies any head trauma although he had been having generalized headache since the fall. Cranial CT scan negative for any acute abnormalities. CT cervical spine with no fracture or malalignment. Blood pressure on admission 90/60 he did receive fluid bolus. Creatinine on admission of 10.02 and improved to 6.27. Followup renal services with hemodialysis ongoing.   Subjective/Complaints: Dizziness better off gabapentin but feet are now hurting, No SOB drop in sats to 91% Spoke to PT , will not reach Mod I goals by tomorrow, low CBG this am Spoke to wife re d/c plans and her multiple concerns Review of Systems - Negative except weakness  Objective: Vital Signs: Blood pressure 85/46, pulse 93, temperature 97.2 F (36.2 C), temperature source Oral, resp. rate 20, height $RemoveBe'6\' 2"'KAdjtLWXV$  (1.88 m), weight 83.8 kg (184 lb 11.9 oz), SpO2 98.00%. No results found. Results for orders placed during the hospital encounter of 05/31/13 (from the past 72 hour(s))  GLUCOSE, CAPILLARY     Status: Abnormal   Collection Time    06/04/13  8:14 AM      Result Value Range   Glucose-Capillary 134 (*) 70 - 99 mg/dL  GLUCOSE, CAPILLARY     Status: Abnormal   Collection Time    06/04/13 11:48 AM      Result Value Range   Glucose-Capillary 116 (*) 70 - 99 mg/dL  GLUCOSE, CAPILLARY     Status: Abnormal   Collection Time    06/04/13  4:36 PM      Result Value Range   Glucose-Capillary 246 (*) 70 - 99 mg/dL  GLUCOSE, CAPILLARY     Status: Abnormal   Collection Time    06/04/13  9:23 PM      Result Value Range   Glucose-Capillary 111 (*) 70 - 99 mg/dL  GLUCOSE, CAPILLARY      Status: Abnormal   Collection Time    06/05/13  2:57 AM      Result Value Range   Glucose-Capillary 154 (*) 70 - 99 mg/dL  PROTIME-INR     Status: Abnormal   Collection Time    06/05/13  5:15 AM      Result Value Range   Prothrombin Time 25.6 (*) 11.6 - 15.2 seconds   INR 2.43 (*) 0.00 - 1.49  SEDIMENTATION RATE     Status: Abnormal   Collection Time    06/05/13  5:15 AM      Result Value Range   Sed Rate 75 (*) 0 - 16 mm/hr  CBC     Status: Abnormal   Collection Time    06/05/13  5:15 AM      Result Value Range   WBC 11.2 (*) 4.0 - 10.5 K/uL   RBC 2.87 (*) 4.22 - 5.81 MIL/uL   Hemoglobin 8.6 (*) 13.0 - 17.0 g/dL   HCT 28.2 (*) 39.0 - 52.0 %   MCV 98.3  78.0 - 100.0 fL   MCH 30.0  26.0 - 34.0 pg   MCHC 30.5  30.0 - 36.0 g/dL   RDW 14.9  11.5 - 15.5 %   Platelets 317  150 - 400 K/uL  RENAL FUNCTION PANEL     Status: Abnormal  Collection Time    06/05/13  5:15 AM      Result Value Range   Sodium 137  137 - 147 mEq/L   Potassium 4.5  3.7 - 5.3 mEq/L   Chloride 96  96 - 112 mEq/L   CO2 26  19 - 32 mEq/L   Glucose, Bld 148 (*) 70 - 99 mg/dL   BUN 50 (*) 6 - 23 mg/dL   Creatinine, Ser 6.04 (*) 0.50 - 1.35 mg/dL   Calcium 8.0 (*) 8.4 - 10.5 mg/dL   Phosphorus 4.2  2.3 - 4.6 mg/dL   Albumin 1.6 (*) 3.5 - 5.2 g/dL   GFR calc non Af Amer 9 (*) >90 mL/min   GFR calc Af Amer 11 (*) >90 mL/min   Comment: (NOTE)     The eGFR has been calculated using the CKD EPI equation.     This calculation has not been validated in all clinical situations.     eGFR's persistently <90 mL/min signify possible Chronic Kidney     Disease.  GLUCOSE, CAPILLARY     Status: Abnormal   Collection Time    06/05/13  7:52 AM      Result Value Range   Glucose-Capillary 136 (*) 70 - 99 mg/dL  GLUCOSE, CAPILLARY     Status: Abnormal   Collection Time    06/05/13 11:59 AM      Result Value Range   Glucose-Capillary 175 (*) 70 - 99 mg/dL  GLUCOSE, CAPILLARY     Status: None   Collection Time     06/05/13  4:27 PM      Result Value Range   Glucose-Capillary 78  70 - 99 mg/dL  GLUCOSE, CAPILLARY     Status: Abnormal   Collection Time    06/05/13 10:28 PM      Result Value Range   Glucose-Capillary 166 (*) 70 - 99 mg/dL   Comment 1 Notify RN    PROTIME-INR     Status: Abnormal   Collection Time    06/06/13  6:28 AM      Result Value Range   Prothrombin Time 27.7 (*) 11.6 - 15.2 seconds   INR 2.69 (*) 0.00 - 1.49  GLUCOSE, CAPILLARY     Status: Abnormal   Collection Time    06/06/13  6:40 AM      Result Value Range   Glucose-Capillary 111 (*) 70 - 99 mg/dL   Comment 1 Notify RN    GLUCOSE, CAPILLARY     Status: Abnormal   Collection Time    06/06/13 11:13 AM      Result Value Range   Glucose-Capillary 171 (*) 70 - 99 mg/dL  GLUCOSE, CAPILLARY     Status: Abnormal   Collection Time    06/06/13  4:40 PM      Result Value Range   Glucose-Capillary 147 (*) 70 - 99 mg/dL  GLUCOSE, CAPILLARY     Status: None   Collection Time    06/06/13  9:17 PM      Result Value Range   Glucose-Capillary 93  70 - 99 mg/dL   Comment 1 Notify RN    PROTIME-INR     Status: Abnormal   Collection Time    06/07/13  2:39 AM      Result Value Range   Prothrombin Time 28.7 (*) 11.6 - 15.2 seconds   INR 2.82 (*) 0.00 - 1.49  GLUCOSE, CAPILLARY     Status: Abnormal   Collection Time  06/07/13  6:58 AM      Result Value Range   Glucose-Capillary 61 (*) 70 - 99 mg/dL  GLUCOSE, CAPILLARY     Status: Abnormal   Collection Time    06/07/13  7:28 AM      Result Value Range   Glucose-Capillary 68 (*) 70 - 99 mg/dL   Comment 1 Notify RN    GLUCOSE, CAPILLARY     Status: None   Collection Time    06/07/13  7:55 AM      Result Value Range   Glucose-Capillary 88  70 - 99 mg/dL   Comment 1 Notify RN       HEENT: normal Cardio: RRR and no murmur Resp: CTA B/L and unlabored GI: BS positive and non tender,ND, Abd binder Extremity:  Pulses negative and No Edema Skin:  Erythema at ankles  with dry necrotic area no drainage,non tender Neuro: Alert/Oriented, Cranial Nerve II-XII normal, Abnormal Sensory reduced sensory R great toe, left little toe and Abnormal Motor 4/5 Bilateral Deltoidf , bi,tri, grip, HF, KE, ADF, 3-EHL Musc/Skel:  Normal Gen NAD   Assessment/Plan: 1. Functional deficits secondary to Deconditioning which require 3+ hours per day of interdisciplinary therapy in a comprehensive inpatient rehab setting. Physiatrist is providing close team supervision and 24 hour management of active medical problems listed below. Physiatrist and rehab team continue to assess barriers to discharge/monitor patient progress toward functional and medical goals.  FIM: FIM - Bathing Bathing Steps Patient Completed: Chest;Front perineal area;Right lower leg (including foot);Left lower leg (including foot);Buttocks;Right Arm;Left Arm;Right upper leg;Left upper leg;Abdomen Bathing: 5: Supervision: Safety issues/verbal cues (verbal cues)  FIM - Upper Body Dressing/Undressing Upper body dressing/undressing steps patient completed: Thread/unthread right sleeve of pullover shirt/dresss;Thread/unthread left sleeve of pullover shirt/dress;Put head through opening of pull over shirt/dress;Pull shirt over trunk Upper body dressing/undressing: 5: Set-up assist to: Obtain clothing/put away FIM - Lower Body Dressing/Undressing Lower body dressing/undressing steps patient completed: Thread/unthread right underwear leg;Thread/unthread left underwear leg;Pull underwear up/down;Thread/unthread right pants leg;Pull pants up/down;Thread/unthread left pants leg;Don/Doff left sock;Don/Doff right sock Lower body dressing/undressing: 5: Set-up assist to: Don/Doff TED stocking  FIM - Toileting Toileting steps completed by patient: Adjust clothing prior to toileting;Adjust clothing after toileting;Performs perineal hygiene Toileting Assistive Devices: Grab bar or rail for support Toileting: 5: Supervision:  Safety issues/verbal cues  FIM - Radio producer Devices: Insurance account manager Transfers: 6-Assistive device: No helper  FIM - Control and instrumentation engineer Devices: Arm rests Bed/Chair Transfer: 6: Supine > Sit: No assist;6: Sit > Supine: No assist;5: Bed > Chair or W/C: Supervision (verbal cues/safety issues);5: Chair or W/C > Bed: Supervision (verbal cues/safety issues)  FIM - Locomotion: Wheelchair Distance: 150 Locomotion: Wheelchair: 5: Travels 150 ft or more: maneuvers on rugs and over door sills with supervision, cueing or coaxing FIM - Locomotion: Ambulation Locomotion: Ambulation Assistive Devices: Administrator Ambulation/Gait Assistance: 5: Supervision Locomotion: Ambulation: 5: Travels 150 ft or more with supervision/safety issues  Comprehension Comprehension Mode: Auditory Comprehension: 6-Follows complex conversation/direction: With extra time/assistive device  Expression Expression Mode: Verbal Expression: 6-Expresses complex ideas: With extra time/assistive device  Social Interaction Social Interaction: 5-Interacts appropriately 90% of the time - Needs monitoring or encouragement for participation or interaction.  Problem Solving Problem Solving: 5-Solves complex 90% of the time/cues < 10% of the time  Memory Memory: 6-More than reasonable amt of time  Medical Problem List and Plan:  1. Multifactorial gait disorder related to diabetes mellitus peripheral neuropathy, osteoarthritis,  orthostasis and multi-medical  2. DVT Prophylaxis/Anticoagulation: Chronic Coumadin therapy for atrial fibrillation. Monitor for any bleeding episodes. Latest INR 2. 48  3. Pain Management: Flexeril 10 mg each bedtime, Resume Neurontin bit instead of 100 mg 3 times a day,try 361m qhs hydrocodone as needed. Monitor with increased mobility  4. Mood/depression. Zoloft 100 mg daily. Provide emotional support  5. Neuropsych: This patient is  capable of making decisions on his own behalf.  6. End-stage renal disease. Continue hemodialysis as per renal services. Follow closely for activity tolerance as it pertains to volume status, effects of HD, etc  7. Diabetes mellitus with peripheral neuropathy.uncontrolled Hemoglobin A1c 6.7. NovoLog 4 units 3 times a day,reduce Lantus insulin 1o units each bedtime. Check CBGs a.c. and at bedtime  8. Dizziness/Orthostasis. Midodrine 10 mg 3 times a day.last ortho vitals looked OK even though pt symptomatic, consider other sources will stop gabapentin-encourage adequate fluid intake  -TEDS extend to thigh hi, abd binder as needed.  9. Hyperlipidemia. Lipitor  10. Chronic anemia. Aranesp. Followup CBC with dialysis  11. GERD. Protonix  12.  Skin area ? Vasculitic change check sed rate ,discussed with nephro. Monitor for now, ESR can be increased r/t HD  LOS (Days) 7 A FACE TO FACE EVALUATION WAS PERFORMED  Comfort Iversen E 06/07/2013, 8:09 AM

## 2013-06-07 NOTE — Progress Notes (Signed)
Social Work Patient ID: Paul [name], male   DOB: May 10, 1956, 58 y.o.   MRN: [id number] Insurance update faxed to [employer], made aware discharge tomorrow.  Await response from Amy.

## 2013-06-07 NOTE — Progress Notes (Signed)
Occupational Therapy Session Note  Patient Details  Name: Paul [name] MRN: [id number] Date of Birth: [birth year]  Today's Date: 06/07/2013 Time: 1100-1200 and [id number] Time Calculation (min): 60 min and 40 min   Short Term Goals: Week 1:  OT Short Term Goal 1 (Week 1): Focus on LTGs secondary short ELOS  Skilled Therapeutic Interventions/Progress Updates:    Session 1: Pt seen for ADL retraining with focus on dynamic standing balance, furniture transfers, and activity tolerance. Pt completed bathing at mod I level and dressing with setup assist for donning TED hose and abdominal binder. Discussed fall prevention and safety at home with wife. Pt is going to use bench to complete shaving and other grooming task while in sitting. She reported she has already removed the throw rugs from her home and she will assist with placing one when he takes a shower. At end of session pt propelled self to family room and completed furniture transfer from couch x2 at Mod I level. At end of session pt left sitting in recliner chair with all needs in reach. BP stable throughout session and no c/o dizziness. BP was 100/64 with HR 89 and BP 106/70 HR 102.   Session 2: Therapy session focused on UE strengthening in prep for sit<>stand and activity tolerance. Pt completed 3 sets x8-10 theraband exercises. Handout provided and min cues from therapist for technique. Pt required short rest breaks after every 2-3 exercises and longer breaks between each set of exercises. Completed sit<>stand x5 trials with supervision and no report of dizziness. BP stable throughout session.  At end of session pt returned to room and left with all needs in reach.    Therapy Documentation Precautions:  Precautions Precautions: Fall Restrictions Weight Bearing Restrictions: No General:   Vital Signs:   Pain: No report of pain during therapy sessions.   See FIM for current functional status  Therapy/Group: Individual  Therapy  Duayne Cal 06/07/2013, 12:06 PM

## 2013-06-07 NOTE — Progress Notes (Addendum)
I have seen and examined this patient and agree with plan as outlined by M. Reinaldo Meeker, PA-C.  Will cont with IHD qTTS while he remains an inpt and hopeful d/c this week with f/u at [facility] once an outpt. His wife reports that he has developed ulcers of his legs and is concerned about caring for them once he is discharged.  Will consult wound care nurse to evaluate and offer recommendations for treatment. Elo Marmolejos A,MD 06/07/2013 10:30 AM

## 2013-06-07 NOTE — Progress Notes (Signed)
Social Work Patient ID: Paul [name], male   DOB: Nov 16, 1955, 58 y.o.   MRN: [id number] [place] with MD who reports pt not medically ready for tomorrow feels needs couple extra days-wants to [place] Thursday. Will update insurance and work in discharge Thursday.

## 2013-06-07 NOTE — Progress Notes (Signed)
Wound care nurse at bedside this day. Pedal pulses palpable with Doppler. Discussed wounds and purple, cool to touch both feet. Patient with increased pain, discomfort. Able to ambulate using rolling walker to bathroom. Elevated feet when sitting up in chair. ABI ordered after discussed with Reesa Chew, PA. Patient monitored for comfort. Tonita Cong, RN

## 2013-06-08 ENCOUNTER — Inpatient Hospital Stay (HOSPITAL_COMMUNITY): Payer: Managed Care, Other (non HMO) | Admitting: Physical Therapy

## 2013-06-08 ENCOUNTER — Inpatient Hospital Stay (HOSPITAL_COMMUNITY): Payer: Managed Care, Other (non HMO)

## 2013-06-08 DIAGNOSIS — I739 Peripheral vascular disease, unspecified: Secondary | ICD-10-CM

## 2013-06-08 LAB — CBC
HCT: 26.2 % — ABNORMAL LOW (ref 39.0–52.0)
Hemoglobin: 8.4 g/dL — ABNORMAL LOW (ref 13.0–17.0)
MCH: 29.9 pg (ref 26.0–34.0)
MCHC: 32.1 g/dL (ref 30.0–36.0)
MCV: 93.2 fL (ref 78.0–100.0)
Platelets: 368 10*3/uL (ref 150–400)
RBC: 2.81 MIL/uL — ABNORMAL LOW (ref 4.22–5.81)
RDW: 14.7 % (ref 11.5–15.5)
WBC: 11.7 10*3/uL — ABNORMAL HIGH (ref 4.0–10.5)

## 2013-06-08 LAB — RENAL FUNCTION PANEL
Albumin: 1.7 g/dL — ABNORMAL LOW (ref 3.5–5.2)
BUN: 93 mg/dL — ABNORMAL HIGH (ref 6–23)
CO2: 19 mEq/L (ref 19–32)
Calcium: 8 mg/dL — ABNORMAL LOW (ref 8.4–10.5)
Chloride: 90 mEq/L — ABNORMAL LOW (ref 96–112)
Creatinine, Ser: 8.42 mg/dL — ABNORMAL HIGH (ref 0.50–1.35)
GFR calc Af Amer: 7 mL/min — ABNORMAL LOW (ref >90)
GFR calc non Af Amer: 6 mL/min — ABNORMAL LOW (ref >90)
Glucose, Bld: 151 mg/dL — ABNORMAL HIGH (ref 70–99)
Phosphorus: 3.8 mg/dL (ref 2.3–4.6)
Potassium: 5 mEq/L (ref 3.7–5.3)
Sodium: 130 mEq/L — ABNORMAL LOW (ref 137–147)

## 2013-06-08 LAB — GLUCOSE, CAPILLARY
Glucose-Capillary: 151 mg/dL — ABNORMAL HIGH (ref 70–99)
Glucose-Capillary: 104 mg/dL — ABNORMAL HIGH (ref 70–99)
Glucose-Capillary: 177 mg/dL — ABNORMAL HIGH (ref 70–99)
Glucose-Capillary: 124 mg/dL — ABNORMAL HIGH (ref 70–99)

## 2013-06-08 LAB — PROTIME-INR
INR: 2.81 — ABNORMAL HIGH (ref 0.00–1.49)
PROTHROMBIN TIME: 28.6 s — AB (ref 11.6–15.2)

## 2013-06-08 MED ORDER — INSULIN GLARGINE 100 UNIT/ML ~~LOC~~ SOLN
10.0000 [IU] | Freq: Every day | SUBCUTANEOUS | Status: DC
  Administered 2013-06-08: 5 [IU] via SUBCUTANEOUS
  Administered 2013-06-09 – 2013-06-10 (×2): 10 [IU] via SUBCUTANEOUS
  Filled 2013-06-08 (×4): qty 0.1

## 2013-06-08 MED ORDER — WARFARIN SODIUM 1 MG PO TABS
1.0000 mg | ORAL_TABLET | Freq: Once | ORAL | Status: DC
  Filled 2013-06-08: qty 1

## 2013-06-08 MED ORDER — INSULIN GLARGINE 100 UNIT/ML ~~LOC~~ SOLN
5.0000 [IU] | Freq: Every day | SUBCUTANEOUS | Status: DC
  Administered 2013-06-08 – 2013-06-11 (×4): 5 [IU] via SUBCUTANEOUS
  Filled 2013-06-08 (×4): qty 0.05

## 2013-06-08 MED ORDER — DARBEPOETIN ALFA-POLYSORBATE 200 MCG/0.4ML IJ SOLN
INTRAMUSCULAR | Status: AC
  Filled 2013-06-08: qty 0.4

## 2013-06-08 NOTE — Progress Notes (Signed)
Patient's CBG after returning from dialysis and before evening meal is 124. Dan PA-C paged and ordered to only give 5units of the scheduled Lantus this evening to prevent hypoglycemia. Patient stable and now eating his meal, RN will administer Lantus at scheduled time.

## 2013-06-08 NOTE — Progress Notes (Signed)
ANTICOAGULATION CONSULT NOTE - Follow Up Consult  Pharmacy Consult for Pharmacy Indication: atrial fibrillation  Allergies  Allergen Reactions  . Metformin And Related Other (See Comments)    Kidney problems    Patient Measurements: Height: 6\' 2"  (188 cm) Weight: 184 lb 11.9 oz (83.8 kg) IBW/kg (Calculated) : 82.2 Heparin Dosing Weight:   Vital Signs: Temp: 97 F (36.1 C) (01/20 0605) Temp src: Oral (01/20 0605) BP: 128/66 mmHg (01/20 0605) Pulse Rate: 58 (01/20 0605)  Labs:  Recent Labs  06/06/13 0628 06/07/13 0239 06/08/13 0625  LABPROT 27.7* 28.7* 28.6*  INR 2.69* 2.82* 2.81*    Estimated Creatinine Clearance: 15.3 ml/min (by C-G formula based on Cr of 6.04).   Medications:  Scheduled:  . aspirin EC  81 mg Oral Daily  . atorvastatin  10 mg Oral Daily  . calcium acetate  2,001 mg Oral TID WC  . cholecalciferol  1,000 Units Oral Daily  . cyclobenzaprine  10 mg Oral QHS  . darbepoetin (ARANESP) injection - DIALYSIS  200 mcg Intravenous Q Tue-HD  . feeding supplement (PRO-STAT SUGAR FREE 64)  30 mL Oral BID WC  . feeding supplement (RESOURCE BREEZE)  1 Container Oral TID WC  . ferric gluconate (FERRLECIT/NULECIT) IV  125 mg Intravenous Q T,Th,Sa-HD  . gabapentin  300 mg Oral QHS  . insulin aspart  0-15 Units Subcutaneous TID WC  . insulin glargine  10 Units Subcutaneous QHS  . insulin glargine  5 Units Subcutaneous Q breakfast  . midodrine  10 mg Oral TID WC  . multivitamin  1 tablet Oral QHS  . pantoprazole  40 mg Oral Daily  . sertraline  100 mg Oral Daily  . Warfarin - Pharmacist Dosing Inpatient   Does not apply q1800    Assessment: 58yo male with AFib.  INR 2.81, stable.  Last Hg 8.7; pt with ESRD on Aranesp for chronic anemia.  No bleeding noted.    Goal of Therapy:  INR 2-3 Monitor platelets by anticoagulation protocol: Yes   Plan:  Repeat Coumadin 1mg  Continue daily INR  Gracy Bruins, PharmD Clinical Pharmacist [facility]

## 2013-06-08 NOTE — Consult Note (Signed)
[facility] [place] HISTORY AND PHYSICAL   History of Present Illness:  Patient is a 58 y.o. year old male who presents for evaluation of bilateral lower extremity leg ulcers.  Pt has been in the hospital several weeks.  The ulcers occurred while in the hospital but the patient does not know how they began. No prior history of ulcers.  He has neuropathy and has a lot of numbness and tingling in the legs and feet.  The ulcers are not painful.   Other medical problems include prior history of duodenal cancer, diabetes, anemia, postural hypotension, deconditioning, afib.  Overall these are stable but he is currently in rehab for his deconditioning.   Past Medical History  Diagnosis Date  . Diabetic neuropathy   . Cancer     History of Duodenal Cancer  . Chronic kidney disease     Pt is not eligible for transplant per [facility]  . Diabetes mellitus     Type 2 iddm x 11 yrs  . Anemia     esrd  . Hypertension     meds d/c 'd  2 wks ago for postural hypotension  . Fatigue      tires easily x 2 months  . Chronic nausea     with  morning vomiting; better off  HTN meds  . Intermittent tremor   . Closed left arm fracture 1967  . Heart murmur   . Corneal abrasion, left   . Shortness of breath     exertional  . GERD (gastroesophageal reflux disease)   . History of blood transfusion   . Lack of coordination   . Muscle weakness (generalized)   . Glomerulosclerosis, diabetic   . Depression   . A-fib     Past Surgical History  Procedure Laterality Date  . Whipple procedure  2002    duodenal ca  . Bone marrow biopsy  2012  . Renal biopsy, percutaneous  2012  . Tonsillectomy    . Portacath placement  2002  . Biliary diversion, external  2002  . Other surgical history      Cyst removed from back  . Av fistula placement  06/07/2011    Procedure: ARTERIOVENOUS (AV) FISTULA CREATION;  Surgeon: Angelia Mould, MD;  Location: [place];  Service: Vascular;  Laterality:  Left;  . Ligation of arteriovenous  fistula  05/22/2012    Procedure: LIGATION OF ARTERIOVENOUS  FISTULA;  Surgeon: Mal Misty, MD;  Location: [place];  Service: Vascular;  Laterality: Left;  Left brachio-cephalic fistula  . Insertion of dialysis catheter  05/22/2012    Procedure: INSERTION OF DIALYSIS CATHETER;  Surgeon: Mal Misty, MD;  Location: [place];  Service: Vascular;  Laterality: N/A;  right internal jugular vein  . Av fistula placement  06/18/2012    Procedure: ARTERIOVENOUS (AV) FISTULA CREATION;  Surgeon: Mal Misty, MD;  Location: [place];  Service: Vascular;  Laterality: Right;  [place]  . Colonoscopy      Hx: of  . Capd insertion N/A 01/14/2013    Procedure: LAPAROSCOPIC INSERTION CONTINUOUS AMBULATORY PERITONEAL DIALYSIS  (CAPD) CATHETER;  Surgeon: Adin Hector, MD;  Location: [facility];  Service: General;  Laterality: N/A;    Social History History  Substance Use Topics  . Smoking status: Never Smoker   . Smokeless tobacco: Never Used  . Alcohol Use: No    Family History Family History  Problem Relation Age of Onset  . Cancer Mother   .  Cancer Father     prostate cancer  . Heart disease Maternal Grandfather   . Stroke Paternal Grandmother     Allergies  Allergies  Allergen Reactions  . Metformin And Related Other (See Comments)    Kidney problems     Current Facility-Administered Medications  Medication Dose Route Frequency Provider Last Rate Last Dose  . acetaminophen (TYLENOL) tablet 325-650 mg  325-650 mg Oral Q4H PRN Lavon Paganini Angiulli, PA-C      . aspirin EC tablet 81 mg  81 mg Oral Daily Lavon Paganini Angiulli, PA-C   81 mg at 06/08/13 0759  . atorvastatin (LIPITOR) tablet 10 mg  10 mg Oral Daily Lavon Paganini Angiulli, PA-C   10 mg at 06/08/13 0759  . calcium acetate (PHOSLO) capsule 2,001 mg  2,001 mg Oral TID WC Daniel J Angiulli, PA-C   2,001 mg at 06/08/13 1214  . cholecalciferol (VITAMIN D) tablet 1,000 Units  1,000 Units Oral Daily Lavon Paganini Angiulli,  PA-C   1,000 Units at 06/08/13 0759  . cyclobenzaprine (FLEXERIL) tablet 10 mg  10 mg Oral QHS Lavon Paganini Angiulli, PA-C   10 mg at 06/07/13 2115  . darbepoetin (ARANESP) injection 200 mcg  200 mcg Intravenous Q Tue-HD Lavon Paganini Angiulli, PA-C   200 mcg at 06/01/13 2038  . feeding supplement (PRO-STAT SUGAR FREE 64) liquid 30 mL  30 mL Oral BID WC Myriam Jacobson, PA-C   30 mL at 06/08/13 0759  . feeding supplement (RESOURCE BREEZE) (RESOURCE BREEZE) liquid 1 Container  1 Container Oral TID WC Lavon Paganini Angiulli, PA-C   1 Container at 06/08/13 1217  . ferric gluconate (NULECIT) 125 mg in sodium chloride 0.9 % 100 mL IVPB  125 mg Intravenous Q T,Th,Sa-HD Lavon Paganini Angiulli, PA-C   125 mg at 06/05/13 2103  . gabapentin (NEURONTIN) capsule 300 mg  300 mg Oral QHS Charlett Blake, MD   300 mg at 06/07/13 2115  . HYDROcodone-acetaminophen (NORCO/VICODIN) 5-325 MG per tablet 1-2 tablet  1-2 tablet Oral Q4H PRN Lavon Paganini Angiulli, PA-C   2 tablet at 06/08/13 1354  . insulin aspart (novoLOG) injection 0-15 Units  0-15 Units Subcutaneous TID WC Lavon Paganini Angiulli, PA-C   3 Units at 06/08/13 1214  . insulin glargine (LANTUS) injection 10 Units  10 Units Subcutaneous QHS Charlett Blake, MD      . insulin glargine (LANTUS) injection 5 Units  5 Units Subcutaneous Q breakfast Charlett Blake, MD   5 Units at 06/08/13 [phone]  . menthol-cetylpyridinium (CEPACOL) lozenge 3 mg  1 lozenge Oral PRN Lavon Paganini Angiulli, PA-C      . midodrine (PROAMATINE) tablet 10 mg  10 mg Oral TID WC Daniel J Angiulli, PA-C   10 mg at 06/08/13 1214  . multivitamin (RENA-VIT) tablet 1 tablet  1 tablet Oral QHS Lavon Paganini Angiulli, PA-C   1 tablet at 06/07/13 2114  . ondansetron (ZOFRAN-ODT) disintegrating tablet 8 mg  8 mg Oral Q8H PRN Lavon Paganini Angiulli, PA-C      . pantoprazole (PROTONIX) EC tablet 40 mg  40 mg Oral Daily Lavon Paganini Angiulli, PA-C   40 mg at 06/08/13 0758  . sertraline (ZOLOFT) tablet 100 mg  100 mg Oral Daily Lavon Paganini  Angiulli, PA-C   100 mg at 06/08/13 0758  . sorbitol 70 % solution 30 mL  30 mL Oral Daily PRN Lavon Paganini Angiulli, PA-C      . warfarin (COUMADIN) tablet 1 mg  1 mg Oral ONCE-1800 Kendra P Hiatt, RPH      . Warfarin - Pharmacist Dosing Inpatient   Does not apply q1800 Daniel J Angiulli, PA-C        ROS:   General:  No weight loss, Fever, chills  HEENT: No recent headaches, no nasal bleeding, no visual changes, no sore throat  Neurologic: No dizziness, blackouts, seizures. No recent symptoms of stroke or mini- stroke. No recent episodes of slurred speech, or temporary blindness.  Cardiac: No recent episodes of chest pain/pressure, no shortness of breath at rest.  + shortness of breath with exertion.  + history of atrial fibrillation or irregular heartbeat  Vascular: No history of rest pain in feet.  No history of claudication.  No history of non-healing ulcer, No history of DVT   Pulmonary: No home oxygen, no productive cough, no hemoptysis,  No asthma or wheezing  Musculoskeletal:  [ ]  Arthritis, [ ]  Low back pain,  [ ]  Joint pain  Hematologic:No history of hypercoagulable state.  No history of easy bleeding.  + history of anemia  Gastrointestinal: No hematochezia or melena,  No gastroesophageal reflux, no trouble swallowing  Urinary: [x ] chronic Kidney disease, [ ]  on HD - [ ]  MWF or [ x] TTHS, [ ]  Burning with urination, [ ]  Frequent urination, [ ]  Difficulty urinating;   Skin: No rashes  Psychological: No history of anxiety,  + history of depression   Physical Examination  Filed Vitals:   06/07/13 1900 06/08/13 0605 06/08/13 1550 06/08/13 1600  BP: 116/69 128/66 116/64 122/63  Pulse: 80 58 69 72  Temp: 99.8 F (37.7 C) 97 F (36.1 C)    TempSrc: Oral Oral    Resp: 18 18 20 18   Height:      Weight:      SpO2: 95% 97%      Body mass index is 23.71 kg/(m^2).  General:  Alert and oriented, no acute distress HEENT: Normal Neck: No bruit or JVD Pulmonary: Clear to  auscultation bilaterally Cardiac: Regular Rate and Rhythm Abdomen: Soft, non-tender, non-distended, no mass Skin: No rash, multiple superficial less than 1 mm depth ulcerations dorsal ankle and foot bilaterally Extremity Pulses:  2+ radial, brachial, femoral, popliteal absent dorsalis pedis, posterior tibial pulses bilaterally Musculoskeletal: trace edema  Neurologic: Upper and lower extremity motor 5/5 and symmetric  DATA:  ABI monophasic tibial flow, calcified vessels   ASSESSMENT:  Multiple superficial ulcerations present for at least 2 weeks.  Evidence of PAD most likely tibial disease.  Severe overall deconditioning   PLAN:  Will need arteriogram to evaluate for wound healing potential.  Wound care per wound care nurse for now.  He is on coumadin with INR 2.8 so we will need for this to drift back to normal.  Will stop coumadin today with anticipation of arteriogram later this week or early next week.  Ruta Hinds, MD [facility] [place] Office: [phone] Pager: [phone]

## 2013-06-08 NOTE — Progress Notes (Signed)
Inpatient Diabetes Program Recommendations  AACE/ADA: New Consensus Statement on Inpatient Glycemic Control (2013)  Target Ranges:  Prepandial:   less than 140 mg/dL      Peak postprandial:   less than 180 mg/dL (1-2 hours)      Critically ill patients:  140 - 180 mg/dL   Noted increase in lantus doses starting today. Pt had hypoglycemia yesterday am with only 5 units of lantus given the day before. CBG this am at 104 mg/dL indicating good control with only 5 units lantus.  Concern that the dose has been increased to total of 15 units today for fear of severe hypoglycemia tomorrow am.  Glucose before lunch yesterday was elevated most probably due to the treatment given for the prior hypoglycemia.  Otherwise the cbg's are controlled well.  Please continue Lantus at only 5 units per day/HS.  If fasting glucose increases beyond 150, would consider increasing the dose. Please feel free to contact [facility] if needed. Thank you, Rosita Kea, RN, CDE Diabetes in-pt program. [phone])

## 2013-06-08 NOTE — Progress Notes (Signed)
I have seen and examined this patient and agree with plan as outlined by M. Bergman, I ordered wound consult to eval lower ext ulcers and ABI's not reliable due to calcification.  Pt may benefit from angiogram and Vascular surgery evaluation. Diania Co A,MD 06/08/2013 2:55 PM

## 2013-06-08 NOTE — Progress Notes (Signed)
[facility] Progress Note  Subjective:   No c/o, just returned from vasc lab  Objective Filed Vitals:   06/07/13 0906 06/07/13 1255 06/07/13 1900 06/08/13 0605  BP: 80/37 110/60 116/69 128/66  Pulse: 102  80 58  Temp:   99.8 F (37.7 C) 97 F (36.1 C)  TempSrc:   Oral Oral  Resp:   18 18  Height:      Weight:      SpO2:   95% 97%   Physical Exam General: NAD supine Heart: RRR Lungs: no wheezes or rales Abdomen: soft NT Extremities: tr LE edema; erythema around dry necrotic ankle wounds Dialysis Access: R AVF + bruit  Assessment/Plan:  1. Debility - in rehab; improving with anticipated discharge 1/20.  2. Fever 99.8 this am - follow - check CBC pre HD today 3. Weakness/falls - was doing poorly on PD, switched to HD with improvement.  4. ESRD - previously on PD, switched to HD on TTS 2 @ [facility] per AVF; flush PD catheter weekly, on Coumadin with tight Heparin PRN per HD; HD today 5. Chronic Hypotension/Volume - variable, asymptomatic, on Midodrine 10 mg tid; kg s/p net UF 3.1 L Saturday with post weight of 82.6; has some lower extrem edema; titrate edw down gradually - HD today 6. Anemia - Hgb up to 8.6, on Aranesp 200 mcg on Tues, IV Fe (125/x/5) per HD; 33% sat 1/08 7. Sec HPT - Ca 8 (9.3 corrected), P 4.2; Vitamin D PO, Phoslo 3 with meals. - had been on 0.25 calcitriol prior to admission - recheck iPTH with next monthly labs at outpt HD and decide how to dose Hectorol then. 8. Nutrition - Alb 1.6, renal diet, vitamin. - add prostat  9. DM - on Insulin - controlled 10. A-fib - on Amiodarone & Coumadin - therapeutic 11. LE wounds - seen by wound care RN yesterday; unclear etiology - ABIs show calcified vessels - working on POC 12. Neuropathy/Depression - on Zoloft, - affect still flat: ?? Increase dose vs add another med 46. Disp - possible D/c Thursday and then start Saturday at [facility] - needs to be there Sat at 11:30 am to sign papers before his first  threatment  Myriam Jacobson, PA-C [place] [phone] 06/08/2013,9:44 AM  LOS: 8 days    Additional Objective Labs: Basic Metabolic Panel:  Recent Labs Lab 06/01/13 1723 06/05/13 0515  NA 132* 137  K 4.8 4.5  CL 91* 96  CO2 20 26  GLUCOSE 197* 148*  BUN 89* 50*  CREATININE 8.77* 6.04*  CALCIUM 7.4* 8.0*  PHOS 5.6* 4.2   Liver Function Tests:  Recent Labs Lab 06/01/13 1723 06/05/13 0515  ALBUMIN 1.5* 1.6*   CBC:  Recent Labs Lab 06/01/13 1724 06/05/13 0515  WBC 12.5* 11.2*  HGB 7.9* 8.6*  HCT 24.0* 28.2*  MCV 94.1 98.3  PLT 338 317  CBG:  Recent Labs Lab 06/07/13 1108 06/07/13 1640 06/07/13 2054 06/08/13 0333 06/08/13 0731  GLUCAP 266* 171* 129* 151* 104*   Lab Results  Component Value Date   INR 2.81* 06/08/2013   INR 2.82* 06/07/2013   INR 2.69* 06/06/2013    Medications:   . aspirin EC  81 mg Oral Daily  . atorvastatin  10 mg Oral Daily  . calcium acetate  2,001 mg Oral TID WC  . cholecalciferol  1,000 Units Oral Daily  . cyclobenzaprine  10 mg Oral QHS  . darbepoetin (ARANESP) injection - DIALYSIS  200 mcg Intravenous Q Tue-HD  . feeding supplement (PRO-STAT SUGAR FREE 64)  30 mL Oral BID WC  . feeding supplement (RESOURCE BREEZE)  1 Container Oral TID WC  . ferric gluconate (FERRLECIT/NULECIT) IV  125 mg Intravenous Q T,Th,Sa-HD  . gabapentin  300 mg Oral QHS  . insulin aspart  0-15 Units Subcutaneous TID WC  . insulin glargine  10 Units Subcutaneous QHS  . insulin glargine  5 Units Subcutaneous Q breakfast  . midodrine  10 mg Oral TID WC  . multivitamin  1 tablet Oral QHS  . pantoprazole  40 mg Oral Daily  . sertraline  100 mg Oral Daily  . warfarin  1 mg Oral ONCE-1800  . Warfarin - Pharmacist Dosing Inpatient   Does not apply [phone]

## 2013-06-08 NOTE — Consult Note (Signed)
NEUROCOGNITIVE TESTING - [facility]   MEDICAL NECESSITY:  [name] was seen on the [facility] for neurocognitive testing owing to the patient's diagnosis of deconditioning.   According to medical records, [name] was admitted to the rehab unit owing to "Functional deficits secondary to deconditioning."  He reportedly has a history of diabetes mellitus with complications of neuropathy, hypokalemia, and end stage chronic kidney disease on peritoneal dialysis at home daily, who presented to the ED with several weeks duration of progressively worsening generalized weakness; one episode of fall earlier the day prior to this admission. He purportedly fell down and he hit his head but denies LOC.  In the ED, the patient was hemodynamically stable; electrolyte panel with several metabolic derangement including low K 3.4, sodium 135, Ca 10.2. CT head is negative for acute intracranial abnormalities.   During today's visit, [name] was accompanied by his wife. They both provided valuable information. The patient denied suffering from any cognitive changes, but his wife notices mild memory loss that existed prior to admission but has worsened since he was hospitalized. [name] main concern was dizziness and syncope owing to what sounds like orthostatic hypotension. From an emotional standpoint, he described his mood as "okay" and there is no history of mental health treatment.   [name] lives with his wife and his wife's mother. His wife is concerned about him returning home too soon because she will not always be present and her mother cannot take on that responsibility. 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 The patient was referred for neuropsychological consultation given the possibility of cognitive sequelae subsequent to the current medical status and in order to assist in treatment  planning.   PROCEDURES: [3 units of 16109 on 06/07/13]  Diagnostic Interview Medical record review Behavioral observations  Neuropsychological testing  Repeatable Battery for the Assessment of Neuropsychological Status (form A)  TEST RESULTS:   RBANS Indices Scaled Score Percentile Description  Immediate Memory  112 79 Above average  Visuospatial/Constructional 109 73 Average  Language 82 12 Below average  Attention 75 5 Impaired  Delayed Memory 124 95 Superior  Total Score 100 50 Average    RBANS Subtests Raw Score Percentile Description  List Learning 29 61 Average  Story Memory 22 88 Above average  Figure Copy 18 45 Average  Line Orientation 19 81 Above average  Picture Naming 10 75 Above average  Semantic Fluency 12 4 Impaired   Digit Span 9 25 Average  Coding 29 3 Impaired  List Recall 8 82 Above average  List Recognition 20 70 Average  Story Recall 12 91 Superior  Figure recall 19 95 Superior   Cognitive Evaluation: Test results revealed a pattern of cognitive slowing. All other tasks were within normal limits (if not above average to superior range).   Emotional & Behavioral Evaluation: [name] was appropriately dressed for season and situation, and he appeared tidy and well-groomed. Normal posture was noted. He was quiet and did not spontaneously generate a lot of conversation. His speech was as expected and he was able to express ideas effectively. He seemed to understand test directions readily. His affect was flat. Mood was indifferent. Attention and motivation were good. Optimal test taking conditions were maintained.  From an emotional standpoint, [name] denied experiencing any significant signs of clinical psychopathology, though he seemed to exhibit indifference. No adjustment issues to his  hospitalization were relayed. Suicidal/homicidal ideation, plan or intent was denied. No manic or hypomanic episodes were reported. The patient denied ever experiencing  any auditory/visual hallucinations. No major behavioral or personality changes were endorsed.    Overall, [name] appears to be experiencing a good deal of cognitive slowing owing to his present medical status, though all other areas were normal (if not above average to superior range). From an emotional standpoint, he denies issues with depression/anxiety or adjustment. It is my hope that with time and treatment of his medical conditions that his thinking speed will improve.   In light of these findings, the following recommendations are provided.    RECOMMENDATIONS  Recommendations for treatment team:    [name] requires more time than typical to process information. The treatment team may benefit from waiting for a verbal response to information before presenting additional information.    Performance will generally be best in a structured, routine, and familiar environment, as opposed to situations involving complex problems.   Recommendations for discharge planning:    Complete a comprehensive neuropsychological evaluation as an outpatient in 8-12 months to assess for interval change. This can be done through Norton Pastel, PsyD by calling the following number: [phone].    Maintain engagement in mentally, physically and cognitively stimulating activities.    Strive to maintain a healthy lifestyle (e.g., proper diet and exercise) in order to promote physical, cognitive and emotional health.    [name] cognitive slowing may place him at a vocational disadvantage. As such, it may be beneficial to work with his employer to implement certain accommodations if he were to return to work.       Paul [name], Psy.D.  Clinical Neuropsychologist

## 2013-06-08 NOTE — Progress Notes (Signed)
Physical Therapy Session Note  Patient Details  Name: Paul [name] MRN: [id number] Date of Birth: Nov 02, 1955  Today's Date: 06/08/2013 Time: 1115-1200 Time Calculation (min): 45 min  Short Term Goals: Week 1:  PT Short Term Goal 1 (Week 1): STG's = LTG's secondary to ELOS  Skilled Therapeutic Interventions/Progress Updates:    Pt received semi-reclined in bed; agreeable to session. Pt reports no episodes of lightheadedness, dizziness today. Session focused on dynamic standing balance, fall recovery. Seated EOB, pt doffed shorts, donned pants and abdominal binder. Therapist donned TED hose. Gait x150' in controlled and home environments with rolling walker, supervision. Administered Berg Balance Scale, on which pt scored 44/56. See below for detailed findings. Verbally explained, demonstrated floor transfer using couch in apartment. Pt then performed floor transfer with min A, mod verbal cueing for technique. Pt then performed floor transfer with supervision for stability/balance, no cues required for technique. No symptoms of orthostatic hypotension throughout session. Session ended in pt room, where pt was left seated in bedside chair with all needs within reach.  Therapy Documentation Precautions:  Precautions Precautions: Fall Restrictions Weight Bearing Restrictions: No Pain: Pain Assessment Pain Assessment: 0-10 Pain Score: 2  Pain Type: Acute pain Pain Location: Foot Pain Orientation: Right;Left Pain Descriptors / Indicators: Stabbing Pain Onset: On-going Pain Intervention(s): Rest;Other (Comment) (Pt reports no desire for medication at this time) Multiple Pain Sites: No Locomotion : Ambulation Ambulation/Gait Assistance: 5: Supervision  Balance: Balance Balance Assessed: Yes Standardized Balance Assessment Standardized Balance Assessment: Berg Balance Test Berg Balance Test Sit to Stand: Able to stand without using hands and stabilize independently Standing  Unsupported: Able to stand safely 2 minutes Sitting with Back Unsupported but Feet Supported on Floor or Stool: Able to sit safely and securely 2 minutes Stand to Sit: Sits safely with minimal use of hands Transfers: Able to transfer safely, definite need of hands Standing Unsupported with Eyes Closed: Able to stand 10 seconds with supervision Standing Ubsupported with Feet Together: Able to place feet together independently and stand for 1 minute with supervision From Standing, Reach Forward with Outstretched Arm: Can reach confidently >25 cm (10") From Standing Position, Pick up Object from Floor: Unable to pick up shoe, but reaches 2-5 cm (1-2") from shoe and balances independently From Standing Position, Turn to Look Behind Over each Shoulder: Looks behind from both sides and weight shifts well Turn 360 Degrees: Able to turn 360 degrees safely but slowly Standing Unsupported, Alternately Place Feet on Step/Stool: Able to complete 4 steps without aid or supervision (Completes 8 steps in 15.5 seconds with min guard for balance) Standing Unsupported, One Foot in Front: Able to plae foot ahead of the other independently and hold 30 seconds Standing on One Leg: Able to lift leg independently and hold equal to or more than 3 seconds Total Score: 44  See FIM for current functional status  Therapy/Group: Individual Therapy  Nia Nathaniel, Malva Cogan 06/08/2013, 12:07 PM

## 2013-06-08 NOTE — Progress Notes (Signed)
Physical Therapy Note  Patient Details  Name: HIDEKI PERTEET MRN: [id number] Date of Birth: 1955-10-22 Today's Date: 06/08/2013  Time: 1400-1500 60 minutes  Group therapy:  Pt with c/o foot pain B, reports he had pain meds prior to treatment. Pt participated in group therapy session with focus on LE strength and endurance.  Pt performed seated and standing therex for B LE strengthening, gait with RW in controlled and home environments with distant supervision, cues for posture, no c/o dizziness.  Sit to stands with supervision.  Nu step x 10 minutes level 4 for UE/LE strength and endurance.  Pt with no c/o dizziness throughout session, reports foot pain improved with activity.   Aleksa Catterton 06/08/2013, 5:43 PM

## 2013-06-08 NOTE — Progress Notes (Signed)
VASCULAR LAB PRELIMINARY  ARTERIAL  ABI completed:  ABIs not accurate due to calcified vessels.  Waveforms abnormal.    RIGHT    LEFT    PRESSURE WAVEFORM  PRESSURE WAVEFORM  BRACHIAL HD access  BRACHIAL 139 Triphasic   DP 301 Monophasic  DP 301   Monophasic   AT   AT    PT 301 Monophasic  PT 301 Monophasic   PER   PER    GREAT TOE  NA GREAT TOE  NA    RIGHT LEFT  ABI NA (calcified vessels) NA (calcified vessels)     Shanna Strength, RVT 06/08/2013, 10:53 AM

## 2013-06-08 NOTE — Progress Notes (Signed)
58 y.o. right-handed male with history of atrial fibrillation on chronic Coumadin , end-stage renal disease secondary to diabetes mellitus on peritoneal dialysis at home. Patient was using a cane and walker prior to admission. Admitted 05/24/2013 with progressive weakness over several weeks as well as episode of fall when trying to get bed. Patient denies any head trauma although he had been having generalized headache since the fall. Cranial CT scan negative for any acute abnormalities. CT cervical spine with no fracture or malalignment. Blood pressure on admission 90/60 he did receive fluid bolus. Creatinine on admission of 10.02 and improved to 6.27. Followup renal services with hemodialysis ongoing.   Subjective/Complaints: Appreciate nephro note, no pain in LEs, poor balance but no dizziness  Review of Systems - Negative except weakness  Objective: Vital Signs: Blood pressure 128/66, pulse 58, temperature 97 F (36.1 C), temperature source Oral, resp. rate 18, height 6\' 2"  (1.88 m), weight 83.8 kg (184 lb 11.9 oz), SpO2 97.00%. No results found. Results for orders placed during the hospital encounter of 05/31/13 (from the past 72 hour(s))  GLUCOSE, CAPILLARY     Status: Abnormal   Collection Time    06/05/13  7:52 AM      Result Value Range   Glucose-Capillary 136 (*) 70 - 99 mg/dL  GLUCOSE, CAPILLARY     Status: Abnormal   Collection Time    06/05/13 11:59 AM      Result Value Range   Glucose-Capillary 175 (*) 70 - 99 mg/dL  GLUCOSE, CAPILLARY     Status: None   Collection Time    06/05/13  4:27 PM      Result Value Range   Glucose-Capillary 78  70 - 99 mg/dL  GLUCOSE, CAPILLARY     Status: Abnormal   Collection Time    06/05/13 10:28 PM      Result Value Range   Glucose-Capillary 166 (*) 70 - 99 mg/dL   Comment 1 Notify RN    PROTIME-INR     Status: Abnormal   Collection Time    06/06/13  6:28 AM      Result Value Range   Prothrombin Time 27.7 (*) 11.6 - 15.2 seconds   INR 2.69 (*) 0.00 - 1.49  GLUCOSE, CAPILLARY     Status: Abnormal   Collection Time    06/06/13  6:40 AM      Result Value Range   Glucose-Capillary 111 (*) 70 - 99 mg/dL   Comment 1 Notify RN    GLUCOSE, CAPILLARY     Status: Abnormal   Collection Time    06/06/13 11:13 AM      Result Value Range   Glucose-Capillary 171 (*) 70 - 99 mg/dL  GLUCOSE, CAPILLARY     Status: Abnormal   Collection Time    06/06/13  4:40 PM      Result Value Range   Glucose-Capillary 147 (*) 70 - 99 mg/dL  GLUCOSE, CAPILLARY     Status: None   Collection Time    06/06/13  9:17 PM      Result Value Range   Glucose-Capillary 93  70 - 99 mg/dL   Comment 1 Notify RN    PROTIME-INR     Status: Abnormal   Collection Time    06/07/13  2:39 AM      Result Value Range   Prothrombin Time 28.7 (*) 11.6 - 15.2 seconds   INR 2.82 (*) 0.00 - 1.49  GLUCOSE, CAPILLARY     Status:  Abnormal   Collection Time    06/07/13  6:58 AM      Result Value Range   Glucose-Capillary 61 (*) 70 - 99 mg/dL  GLUCOSE, CAPILLARY     Status: Abnormal   Collection Time    06/07/13  7:28 AM      Result Value Range   Glucose-Capillary 68 (*) 70 - 99 mg/dL   Comment 1 Notify RN    GLUCOSE, CAPILLARY     Status: None   Collection Time    06/07/13  7:55 AM      Result Value Range   Glucose-Capillary 88  70 - 99 mg/dL   Comment 1 Notify RN    GLUCOSE, CAPILLARY     Status: Abnormal   Collection Time    06/07/13 11:08 AM      Result Value Range   Glucose-Capillary 266 (*) 70 - 99 mg/dL   Comment 1 Notify RN    GLUCOSE, CAPILLARY     Status: Abnormal   Collection Time    06/07/13  4:40 PM      Result Value Range   Glucose-Capillary 171 (*) 70 - 99 mg/dL  GLUCOSE, CAPILLARY     Status: Abnormal   Collection Time    06/07/13  8:54 PM      Result Value Range   Glucose-Capillary 129 (*) 70 - 99 mg/dL  GLUCOSE, CAPILLARY     Status: Abnormal   Collection Time    06/08/13  3:33 AM      Result Value Range   Glucose-Capillary  151 (*) 70 - 99 mg/dL  PROTIME-INR     Status: Abnormal   Collection Time    06/08/13  6:25 AM      Result Value Range   Prothrombin Time 28.6 (*) 11.6 - 15.2 seconds   INR 2.81 (*) 0.00 - 1.49  GLUCOSE, CAPILLARY     Status: Abnormal   Collection Time    06/08/13  7:31 AM      Result Value Range   Glucose-Capillary 104 (*) 70 - 99 mg/dL   Comment 1 Notify RN       HEENT: normal Cardio: RRR and no murmur Resp: CTA B/L and unlabored GI: BS positive and non tender,ND, Abd binder Extremity:  Pulses negative and No Edema Skin:  Erythema at ankles with dry necrotic area no drainage,non tender Neuro: Alert/Oriented, Cranial Nerve II-XII normal, Abnormal Sensory reduced sensory R great toe, left little toe and Abnormal Motor 4/5 Bilateral Deltoidf , bi,tri, grip, HF, KE, ADF, 3-EHL Musc/Skel:  Normal Gen NAD   Assessment/Plan: 1. Functional deficits secondary to Deconditioning which require 3+ hours per day of interdisciplinary therapy in a comprehensive inpatient rehab setting. Physiatrist is providing close team supervision and 24 hour management of active medical problems listed below. Physiatrist and rehab team continue to assess barriers to discharge/monitor patient progress toward functional and medical goals.  FIM: FIM - Bathing Bathing Steps Patient Completed: Chest;Front perineal area;Right lower leg (including foot);Left lower leg (including foot);Buttocks;Right Arm;Left Arm;Right upper leg;Left upper leg;Abdomen Bathing: 6: More than reasonable amount of time  FIM - Upper Body Dressing/Undressing Upper body dressing/undressing steps patient completed: Thread/unthread right sleeve of pullover shirt/dresss;Thread/unthread left sleeve of pullover shirt/dress;Put head through opening of pull over shirt/dress;Pull shirt over trunk Upper body dressing/undressing: 5: Set-up assist to: Obtain clothing/put away FIM - Lower Body Dressing/Undressing Lower body dressing/undressing  steps patient completed: Thread/unthread right underwear leg;Thread/unthread left underwear leg;Pull underwear up/down;Thread/unthread right pants leg;Pull  pants up/down;Thread/unthread left pants leg;Don/Doff left sock;Don/Doff right sock Lower body dressing/undressing: 5: Set-up assist to: Don/Doff TED stocking  FIM - Toileting Toileting steps completed by patient: Adjust clothing prior to toileting;Adjust clothing after toileting;Performs perineal hygiene Toileting Assistive Devices: Grab bar or rail for support Toileting: 5: Supervision: Safety issues/verbal cues  FIM - Radio producer Devices: Insurance account manager Transfers: 6-Assistive device: No helper  FIM - Control and instrumentation engineer Devices: Arm rests;Walker Bed/Chair Transfer: 6: Supine > Sit: No assist;6: Sit > Supine: No assist;5: Bed > Chair or W/C: Supervision (verbal cues/safety issues);5: Chair or W/C > Bed: Supervision (verbal cues/safety issues)  FIM - Locomotion: Wheelchair Distance: 100 Locomotion: Wheelchair: 5: Travels 50 - 149 ft, turns around, maneuvers to table, bed or toilet, negotiates 3% grade: modified independent FIM - Locomotion: Ambulation Locomotion: Ambulation Assistive Devices: Administrator Ambulation/Gait Assistance: 5: Supervision Locomotion: Ambulation: 2: Travels 50 - 149 ft with supervision/safety issues  Comprehension Comprehension Mode: Auditory Comprehension: 7-Follows complex conversation/direction: With no assist  Expression Expression Mode: Verbal Expression: 7-Expresses complex ideas: With no assist  Social Interaction Social Interaction: 6-Interacts appropriately with others with medication or extra time (anti-anxiety, antidepressant).  Problem Solving Problem Solving: 6-Solves complex problems: With extra time  Memory Memory: 7-Complete Independence: No helper  Medical Problem List and Plan:  1. Multifactorial gait disorder  related to diabetes mellitus peripheral neuropathy, osteoarthritis, orthostasis and multi-medical  2. DVT Prophylaxis/Anticoagulation: Chronic Coumadin therapy for atrial fibrillation. Monitor for any bleeding episodes. Latest INR 2. 48  3. Pain Management: Flexeril 10 mg each bedtime, Resume Neurontin bit instead of 100 mg 3 times a day,try 300mg  qhs hydrocodone as needed. Monitor with increased mobility  4. Mood/depression. Zoloft 100 mg daily. Provide emotional support  5. Neuropsych: This patient is capable of making decisions on his own behalf.  6. End-stage renal disease. Continue hemodialysis as per renal services. Follow closely for activity tolerance as it pertains to volume status, effects of HD, etc  7. Diabetes mellitus with peripheral neuropathy.uncontrolled Hemoglobin A1c 6.7. NovoLog 4 units 3 times a day,reduce Lantus insulin 1o units each bedtime and 5Units qam. Check CBGs a.c. and at bedtime  8. Dizziness/Orthostasis. Midodrine 10 mg 3 times a day.last ortho vitals looked OK even though pt symptomatic, gabapentin-encourage adequate fluid intake  -TEDS extend to thigh hi, abd binder as needed.  9. Hyperlipidemia. Lipitor  10. Chronic anemia. Aranesp. Followup CBC with dialysis  11. GERD. Protonix  12.  Skin area ? Vasculitic change vs lg vessel arterial issue check ABIs  LOS (Days) 8 A FACE TO FACE EVALUATION WAS PERFORMED  Jeronica Stlouis E 06/08/2013, 7:49 AM

## 2013-06-08 NOTE — Progress Notes (Signed)
Social Work Patient ID: Paul [name], male   DOB: [birth year], 58 y.o.   MRN: [id number] [name] regarding pt's medical equipment, suppose to be delivered to room today.  They have the wrong order and will try to correct with [employer]. Pt reports no one has contacted either he or his wife regarding this.  Have placed a call with them.  Try to have [employer] deliver today.

## 2013-06-08 NOTE — Progress Notes (Signed)
Occupational Therapy Session Note  Patient Details  Name: Paul [name] MRN: [id number] Date of Birth: 1955-08-13  Today's Date: 06/08/2013 Time: 0815-0900 and [id number]  Time Calculation (min): 45 min and 42 min   Short Term Goals: Week 1:  OT Short Term Goal 1 (Week 1): Focus on LTGs secondary short ELOS  Skilled Therapeutic Interventions/Progress Updates:    Session 1: Pt seen for ADL retraining with focus on activity tolerance and standing balance. Pt retrieved clothing from drawers while sitting EOB demonstrating good energy conservation techniques. Required supervision for ambulating to walk-in shower with RW and verbal cues with turn change. Completed bathing and dressing at Mod I level with increased time. Practiced transporting items in kitchen from countertop to table. Pt demonstrated good safety as he slid items down counter then carried them to counter without AD and good balance. At end of session, completed sit<>stand x10, 20 squats with 3 rest breaks, and 20 marching while standing unsupported with min-supervision for balance. Pt left supine in bed with all needs in reach.   Session 2:Therapy session focused on sit<>stand, standing tolerance/balance, and UE strengthening. Pt engaged in [employer] while in standing and completing sit<>stand 10+ times at supervision level. No c/o dizziness throughout session. Completed theraband exercises 2 sets x10 reps while following handout without cues. Wife present during therapy session and discussed home setup and shower chair she purchased. At end of session pt left with all needs in reach.   Therapy Documentation Precautions:  Precautions Precautions: Fall Restrictions Weight Bearing Restrictions: No General:   Vital Signs: Therapy Vitals Temp: 97 F (36.1 C) Temp src: Oral Pulse Rate: 58 Resp: 18 BP: 128/66 mmHg Patient Position, if appropriate: Lying Oxygen Therapy SpO2: 97 % O2 Device: None (Room air) Pain: No report  of pain during therapy sessions.   Other Treatments:    See FIM for current functional status  Therapy/Group: Individual Therapy  Duayne Cal 06/08/2013, 9:56 AM

## 2013-06-09 ENCOUNTER — Inpatient Hospital Stay (HOSPITAL_COMMUNITY): Payer: Managed Care, Other (non HMO)

## 2013-06-09 ENCOUNTER — Inpatient Hospital Stay (HOSPITAL_COMMUNITY): Payer: Managed Care, Other (non HMO) | Admitting: Physical Therapy

## 2013-06-09 ENCOUNTER — Encounter (HOSPITAL_COMMUNITY): Payer: Managed Care, Other (non HMO)

## 2013-06-09 LAB — GLUCOSE, CAPILLARY
GLUCOSE-CAPILLARY: 121 mg/dL — AB (ref 70–99)
GLUCOSE-CAPILLARY: 129 mg/dL — AB (ref 70–99)
Glucose-Capillary: 89 mg/dL (ref 70–99)
Glucose-Capillary: 162 mg/dL — ABNORMAL HIGH (ref 70–99)
Glucose-Capillary: 161 mg/dL — ABNORMAL HIGH (ref 70–99)

## 2013-06-09 LAB — PROTIME-INR
INR: 2.18 — ABNORMAL HIGH (ref 0.00–1.49)
PROTHROMBIN TIME: 23.6 s — AB (ref 11.6–15.2)

## 2013-06-09 MED ORDER — SEVELAMER CARBONATE 800 MG PO TABS
1600.0000 mg | ORAL_TABLET | Freq: Three times a day (TID) | ORAL | Status: DC
  Administered 2013-06-09 – 2013-06-11 (×5): 1600 mg via ORAL
  Filled 2013-06-09 (×8): qty 2

## 2013-06-09 NOTE — Progress Notes (Signed)
Pt scheduled for arteriogram by my partner Dr Scot Dock on Monday 1/26.  Please continue to hold coumadin  Paul Hinds, MD [facility] Office: [phone] Pager: [phone]

## 2013-06-09 NOTE — Progress Notes (Signed)
I have seen and examined this patient and agree with plan as outlined by M. Reinaldo Meeker, PA-C.  Pt's lower ext ulcers developed during this hospitalization and has recently started on coumadin.  DDx includes PVD, calciphylaxis, as well as coumadin necrosis.  Would hold coumadin, agree with angiogram (appreciate Dr. Nona Dell attention), follow Ca/Phos/iPTH and wound care. Theresia Pree A,MD 06/09/2013 12:30 PM

## 2013-06-09 NOTE — Progress Notes (Addendum)
Physical Therapy Weekly Progress Note  Patient Details  Name: Paul [name] MRN: [id number] Date of Birth: 1956-04-07  Today's Date: 06/09/2013 Time: [id number] and [id number] Time Calculation (min): 40 min and 45 min  Due to estimated LOS, STG's = LTG's. However, pt has not met all LTG's secondary to orthostatic hypotension, decreased endurance, limited bilat LE strength, and decreased stability/independence with gait and stair negotiation.  Patient continues to demonstrate the following deficits: decreased stability/independence with gait and stair negotiation and therefore will continue to benefit from skilled PT intervention to enhance overall performance with activity tolerance, balance, postural control and ability to compensate for deficits.  Patient progressing toward long term goals..  Continue plan of care.  PT Short Term Goals Week 1:  PT Short Term Goal 1 (Week 1): STG's = LTG's secondary to ELOS  Skilled Therapeutic Interventions/Progress Updates:    Treatment Session 1: Pt received semi-reclined in bed; agreeable to session. Reports inability to walk secondary to bilat foot pain following dialysis last night. Bilat foot pain currently minimal secondary to pain medication taken pre-treatment. Donned TED hose and abdominal binder to prevent orthostatic hypotension. Session focused on increasing stability/independence with gait and stair negotiation.   Transported pt to hospital lobby, where pt performed gait x160' in community environment with rolling walker and supervision. On rehab unit, initiated gait with SPC x120' in controlled environment, x105' in home environment with min A, verbal cueing for sequencing with SPC. W/c mobility x200' (for LE strengthening) in controlled and home environments with bilat LE's and intermittent use of bilat UE's with mod I. Negotiation of 6 steps with single (R) rail, forward facing with RUE at R rail to ascend initial 3 steps; however, pt required  min A secondary to bilat LE weakness. Remainder of stair negotiation performed with bilat UE's at R rail with pt facing sideways with min guard. Session ended in pt room, where pt was left seated in bedside chair with all needs within reach.   Treatment Session 2: Pt received seated in bedside chair with wife present. Pt agreeable to therapy. Session focused on assessing/addressing pt safety/independence with functional mobility without abdominal binder. Pt also unable to wear TED hose due to wound dressings on bilat LE's.   Session focused on continued hands-on family education/training and increasing gait stability. Pt performed gait x95', x140' in controlled and home environments with SPC and min A; single LOB (with effective self-recovery) secondary to L toe catch. Performed floor transfer using couch (apartment) with min A of this therapist, followed by min A of wife. Wife gave effective return demonstration of assistance. Pt performed multiple stand pivot transfers with SPC with min guard. Negotiation of 9 stairs with R rail, step-to pattern. Initial 6 stairs negotiated forward-facing with RUE at R rail with min guard to ascend, min A to descend; final 3 stairs negotiation with bilat UE support at R rail with supervision. Frequent seated rest breaks required throughout session secondary to pt fatigue. Session ended in pt room, where pt was left seated in bedside chair with wife present and all needs within reach.  Therapy Documentation Precautions:  Precautions Precautions: Fall Restrictions Weight Bearing Restrictions: No Pain:   Pt reports 3/10 pain in bilat feet during AM and PM sessions. RN aware. Locomotion : Ambulation Ambulation/Gait Assistance: 4: Min assist   See FIM for current functional status  Therapy/Group: Individual Therapy  [place], Malva Cogan 06/09/2013, 5:16 PM

## 2013-06-09 NOTE — Consult Note (Signed)
WOC wound follow up Wound type: possible arterial ulcerations, ABI inconclusive, VVS consultation-recommending arteriogram later this week or early next week once off coumadin and INR normalized.  Will order non adherents for now until wound healing potential can be determined by vascular work up.   Wound bed: 100% eschar/necrotic ulceration pretibial area, skin discoloration bilateral toes/feet.  Dark/maroon tissue left plantar great toe.  Drainage (amount, consistency, odor) none Periwound: erythema, feet cool to the touch with doppler pulses Dressing procedure/placement/frequency: xeroform gauze for nonadherent and antibacterial effects. Change every other day.     WOC will follow along with you for wound care recommendations once wound healing potential determined.  Traylon Schimming [place] RN,CWOCN [id number]

## 2013-06-09 NOTE — Progress Notes (Signed)
58 y.o. right-handed male with history of atrial fibrillation on chronic Coumadin , end-stage renal disease secondary to diabetes mellitus on peritoneal dialysis at home. Patient was using a cane and walker prior to admission. Admitted 05/24/2013 with progressive weakness over several weeks as well as episode of fall when trying to get bed. Patient denies any head trauma although he had been having generalized headache since the fall. Cranial CT scan negative for any acute abnormalities. CT cervical spine with no fracture or malalignment. Blood pressure on admission 90/60 he did receive fluid bolus. Creatinine on admission of 10.02 and improved to 6.27. Followup renal services with hemodialysis ongoing.   Subjective/Complaints: Appreciate nephro note, foot numbness with pain that increases slightly with walking, no clear cut claudication symptoms poor balance but no dizziness  Review of Systems - Negative except weakness  Objective: Vital Signs: Blood pressure 110/59, pulse 68, temperature 97.2 F (36.2 C), temperature source Oral, resp. rate 18, height $RemoveBe'6\' 2"'DCUTmTSAi$  (1.88 m), weight 85.3 kg (188 lb 0.8 oz), SpO2 94.00%. No results found. Results for orders placed during the hospital encounter of 05/31/13 (from the past 72 hour(s))  PROTIME-INR     Status: Abnormal   Collection Time    06/06/13  6:28 AM      Result Value Range   Prothrombin Time 27.7 (*) 11.6 - 15.2 seconds   INR 2.69 (*) 0.00 - 1.49  GLUCOSE, CAPILLARY     Status: Abnormal   Collection Time    06/06/13  6:40 AM      Result Value Range   Glucose-Capillary 111 (*) 70 - 99 mg/dL   Comment 1 Notify RN    GLUCOSE, CAPILLARY     Status: Abnormal   Collection Time    06/06/13 11:13 AM      Result Value Range   Glucose-Capillary 171 (*) 70 - 99 mg/dL  GLUCOSE, CAPILLARY     Status: Abnormal   Collection Time    06/06/13  4:40 PM      Result Value Range   Glucose-Capillary 147 (*) 70 - 99 mg/dL  GLUCOSE, CAPILLARY     Status:  None   Collection Time    06/06/13  9:17 PM      Result Value Range   Glucose-Capillary 93  70 - 99 mg/dL   Comment 1 Notify RN    PROTIME-INR     Status: Abnormal   Collection Time    06/07/13  2:39 AM      Result Value Range   Prothrombin Time 28.7 (*) 11.6 - 15.2 seconds   INR 2.82 (*) 0.00 - 1.49  GLUCOSE, CAPILLARY     Status: Abnormal   Collection Time    06/07/13  6:58 AM      Result Value Range   Glucose-Capillary 61 (*) 70 - 99 mg/dL  GLUCOSE, CAPILLARY     Status: Abnormal   Collection Time    06/07/13  7:28 AM      Result Value Range   Glucose-Capillary 68 (*) 70 - 99 mg/dL   Comment 1 Notify RN    GLUCOSE, CAPILLARY     Status: None   Collection Time    06/07/13  7:55 AM      Result Value Range   Glucose-Capillary 88  70 - 99 mg/dL   Comment 1 Notify RN    GLUCOSE, CAPILLARY     Status: Abnormal   Collection Time    06/07/13 11:08 AM  Result Value Range   Glucose-Capillary 266 (*) 70 - 99 mg/dL   Comment 1 Notify RN    GLUCOSE, CAPILLARY     Status: Abnormal   Collection Time    06/07/13  4:40 PM      Result Value Range   Glucose-Capillary 171 (*) 70 - 99 mg/dL  GLUCOSE, CAPILLARY     Status: Abnormal   Collection Time    06/07/13  8:54 PM      Result Value Range   Glucose-Capillary 129 (*) 70 - 99 mg/dL  GLUCOSE, CAPILLARY     Status: Abnormal   Collection Time    06/08/13  3:33 AM      Result Value Range   Glucose-Capillary 151 (*) 70 - 99 mg/dL  PROTIME-INR     Status: Abnormal   Collection Time    06/08/13  6:25 AM      Result Value Range   Prothrombin Time 28.6 (*) 11.6 - 15.2 seconds   INR 2.81 (*) 0.00 - 1.49  GLUCOSE, CAPILLARY     Status: Abnormal   Collection Time    06/08/13  7:31 AM      Result Value Range   Glucose-Capillary 104 (*) 70 - 99 mg/dL   Comment 1 Notify RN    GLUCOSE, CAPILLARY     Status: Abnormal   Collection Time    06/08/13 11:10 AM      Result Value Range   Glucose-Capillary 177 (*) 70 - 99 mg/dL    Comment 1 Notify RN    CBC     Status: Abnormal   Collection Time    06/08/13  4:02 PM      Result Value Range   WBC 11.7 (*) 4.0 - 10.5 K/uL   RBC 2.81 (*) 4.22 - 5.81 MIL/uL   Hemoglobin 8.4 (*) 13.0 - 17.0 g/dL   HCT 26.2 (*) 39.0 - 52.0 %   MCV 93.2  78.0 - 100.0 fL   MCH 29.9  26.0 - 34.0 pg   MCHC 32.1  30.0 - 36.0 g/dL   RDW 14.7  11.5 - 15.5 %   Platelets 368  150 - 400 K/uL  RENAL FUNCTION PANEL     Status: Abnormal   Collection Time    06/08/13  4:02 PM      Result Value Range   Sodium 130 (*) 137 - 147 mEq/L   Potassium 5.0  3.7 - 5.3 mEq/L   Chloride 90 (*) 96 - 112 mEq/L   CO2 19  19 - 32 mEq/L   Glucose, Bld 151 (*) 70 - 99 mg/dL   BUN 93 (*) 6 - 23 mg/dL   Creatinine, Ser 8.42 (*) 0.50 - 1.35 mg/dL   Calcium 8.0 (*) 8.4 - 10.5 mg/dL   Phosphorus 3.8  2.3 - 4.6 mg/dL   Albumin 1.7 (*) 3.5 - 5.2 g/dL   GFR calc non Af Amer 6 (*) >90 mL/min   GFR calc Af Amer 7 (*) >90 mL/min   Comment: (NOTE)     The eGFR has been calculated using the CKD EPI equation.     This calculation has not been validated in all clinical situations.     eGFR's persistently <90 mL/min signify possible Chronic Kidney     Disease.  GLUCOSE, CAPILLARY     Status: Abnormal   Collection Time    06/08/13  8:35 PM      Result Value Range   Glucose-Capillary 124 (*) 70 -  99 mg/dL  GLUCOSE, CAPILLARY     Status: Abnormal   Collection Time    06/09/13  3:06 AM      Result Value Range   Glucose-Capillary 121 (*) 70 - 99 mg/dL     HEENT: normal Cardio: RRR and no murmur Resp: CTA B/L and unlabored GI: BS positive and non tender,ND, Abd binder Extremity:  Pulses negative and No Edema Skin:  Erythema at ankles with dry necrotic area no drainage,non tender Neuro: Alert/Oriented, Cranial Nerve II-XII normal, Abnormal Sensory reduced sensory R great toe, left little toe and Abnormal Motor 4/5 Bilateral Deltoidf , bi,tri, grip, HF, KE, ADF, 3-EHL Musc/Skel:  Normal Gen  NAD   Assessment/Plan: 1. Functional deficits secondary to Deconditioning which require 3+ hours per day of interdisciplinary therapy in a comprehensive inpatient rehab setting. Physiatrist is providing close team supervision and 24 hour management of active medical problems listed below. Physiatrist and rehab team continue to assess barriers to discharge/monitor patient progress toward functional and medical goals. Team conference today please see physician documentation under team conference tab, met with team face-to-face to discuss problems,progress, and goals. Formulized individual treatment plan based on medical history, underlying problem and comorbidities. Because of need to have normal INR for arteriogram, will need to delay D/C until Fri FIM: FIM - Bathing Bathing Steps Patient Completed: Chest;Front perineal area;Right lower leg (including foot);Left lower leg (including foot);Buttocks;Right Arm;Left Arm;Right upper leg;Left upper leg;Abdomen Bathing: 6: More than reasonable amount of time  FIM - Upper Body Dressing/Undressing Upper body dressing/undressing steps patient completed: Thread/unthread right sleeve of pullover shirt/dresss;Thread/unthread left sleeve of pullover shirt/dress;Put head through opening of pull over shirt/dress;Pull shirt over trunk Upper body dressing/undressing: 6: More than reasonable amount of time FIM - Lower Body Dressing/Undressing Lower body dressing/undressing steps patient completed: Thread/unthread right underwear leg;Thread/unthread left underwear leg;Pull underwear up/down;Thread/unthread right pants leg;Pull pants up/down;Thread/unthread left pants leg;Don/Doff left sock;Don/Doff right sock Lower body dressing/undressing: 6: More than reasonable amount of time  FIM - Toileting Toileting steps completed by patient: Adjust clothing prior to toileting;Adjust clothing after toileting;Performs perineal hygiene Toileting Assistive Devices: Grab bar or  rail for support Toileting: 5: Supervision: Safety issues/verbal cues  FIM - Radio producer Devices: Insurance account manager Transfers: 6-More than reasonable amt of time  FIM - Control and instrumentation engineer Devices: Arm rests;Walker Bed/Chair Transfer: 6: Supine > Sit: No assist;5: Bed > Chair or W/C: Supervision (verbal cues/safety issues);5: Chair or W/C > Bed: Supervision (verbal cues/safety issues)  FIM - Locomotion: Wheelchair Distance: 100 Locomotion: Wheelchair: 0: Activity did not occur FIM - Locomotion: Ambulation Locomotion: Ambulation Assistive Devices: Administrator Ambulation/Gait Assistance: 5: Supervision Locomotion: Ambulation: 5: Travels 150 ft or more with supervision/safety issues  Comprehension Comprehension Mode: Auditory Comprehension: 7-Follows complex conversation/direction: With no assist  Expression Expression Mode: Verbal Expression: 7-Expresses complex ideas: With no assist  Social Interaction Social Interaction: 6-Interacts appropriately with others with medication or extra time (anti-anxiety, antidepressant).  Problem Solving Problem Solving: 7-Solves complex problems: Recognizes & self-corrects  Memory Memory: 7-Complete Independence: No helper  Medical Problem List and Plan:  1. Multifactorial gait disorder related to diabetes mellitus peripheral neuropathy, osteoarthritis, orthostasis and multi-medical  2. DVT Prophylaxis/Anticoagulation: Chronic Coumadin therapy for atrial fibrillation. Monitor for any bleeding episodes. Latest INR 2. 48  3. Pain Management: Flexeril 10 mg each bedtime, Resume Neurontin bit instead of 100 mg 3 times a day,try $RemoveB'300mg'dnnfuHVC$  qhs hydrocodone as needed. Monitor with increased mobility  4. Mood/depression.  Zoloft 100 mg daily. Provide emotional support  5. Neuropsych: This patient is capable of making decisions on his own behalf.  6. End-stage renal disease. Continue hemodialysis  as per renal services. Follow closely for activity tolerance as it pertains to volume status, effects of HD, etc  7. Diabetes mellitus with peripheral neuropathy.uncontrolled Hemoglobin A1c 6.7. NovoLog 4 units 3 times a day,reduce Lantus insulin 1o units each bedtime and 5Units qam. Check CBGs a.c. and at bedtime  8. Dizziness/Orthostasis. Midodrine 10 mg 3 times a day.last ortho vitals looked OK even though pt symptomatic, gabapentin-encourage adequate fluid intake  -TEDS extend to thigh hi, abd binder as needed.  9. Hyperlipidemia. Lipitor  10. Chronic anemia. Aranesp. Followup CBC with dialysis  11. GERD. Protonix  12.  Skin area ? Vasculitic change vs lg vessel arterial issue  ABIs inconclusive, [facility]VS wants arteriogram.  LOS (Days) 9 A FACE TO FACE EVALUATION WAS PERFORMED  Paul [name] E 06/09/2013, 6:24 AM

## 2013-06-09 NOTE — Progress Notes (Signed)
Occupational Therapy Weekly Progress Note  Patient Details  Name: Paul [name] MRN: [id number] Date of Birth: 14-Nov-1955  Today's Date: 06/09/2013 Time: 1122-1202 Time Calculation (min): 40 min  Patient has met all short term goals and is focusing on long term goals at this time. Patient's main barriers during rehab stay has been his orthostatis and wounds around bil feet. Patient has progressed to having less orthostatic episodes and progressed to completing ADL session this AM without binder and BP stable. Patient with no c/o dizziness as well. Patient reports pain in bil feet which is being addressed by wound care at this time. Patient plans to discharge this week on 1/23.   Patient continues to demonstrate the following deficits: decreased postural control in standing, decreased balance reactions, decreased standing tolerance, decreased strength and therefore will continue to benefit from skilled OT intervention to enhance overall performance with BADL, functional transfers, strengthening, dynamic standing balance, and IADL tasks.  Patient progressing toward long term goals..  Continue plan of care.  OT Short Term Goals Week 2:  OT Short Term Goal 1 (Week 2): Focus on LTGs  Skilled Therapeutic Interventions/Progress Updates:    Pt seen for ADL retraining with focus on dynamic standing balance, functional transfers using [place], and sit<>stand. Pt received sitting in recliner chair and therapist removed abdominal binder. Pt ambulated to toilet with [place] and min guard assist with 1 LOB requiring min assist to correct. Completed bathing and dressing at Mod I level. Pt retrieved clothing while sitting EOB for good energy conservation techniques. Pt required supervision when ambulating out of bathroom using  and increased time. Pt BP 105/69 and HR 88 at end of session. Pt with no reports of dizziness throughout session and abdominal binder left off. RN notified. Pt left with all needs in reach and MD  present.   Therapy Documentation Precautions:  Precautions Precautions: Fall Restrictions Weight Bearing Restrictions: No General: General Amount of Missed OT Time (min): 5 Minutes Vital Signs:   Pain: Pain Assessment Pain Assessment: 0-10 Pain Score: 3  Pain Type: Neuropathic pain Pain Location: Foot Pain Orientation: Right;Left Pain Descriptors / Indicators: Burning Pain Onset: On-going Multiple Pain Sites: No ADL:   Exercises:   Other Treatments:    See FIM for current functional status  Therapy/Group: Individual Therapy  Duayne Cal 06/09/2013, 12:09 PM

## 2013-06-09 NOTE — Progress Notes (Signed)
Heparin per pharmacy  Anticoagulation: history of Afib was on Coumadin from [facility]. INR 2.18 today.  Coumadin was on hold (probably need coumadin 2.5 mg daily) on 01/20 due to needing arteriogram when INR normalizes.  MD wants to start heparin when INR <1.7.  Chronic anemia (ESRD, on aranesp), No bleeding noted per chart.   PTA dose: 5mg  every day except on Wednesday and Sundays take 2.5mg   Goal heparin level is 0.3-0.7  1. F/u am INR to start heparin if INR <1.7

## 2013-06-09 NOTE — Progress Notes (Signed)
[facility] Progress Note  Subjective:   Up showering this am; abdominal binder off; just wearing footies; BP post shower 107/59 without dizziness  Objective Filed Vitals:   06/08/13 1946 06/08/13 1955 06/08/13 2031 06/09/13 0523  BP: 120/66 138/72 127/73 110/59  Pulse: 71 71 71 68  Temp: 97.9 F (36.6 C)  97.2 F (36.2 C) 97.2 F (36.2 C)  TempSrc: Oral  Oral Oral  Resp: 17 18 18 18   Height:      Weight:  85.3 kg (188 lb 0.8 oz)    SpO2: 99%  98% 94%   Physical Exam General: NAD  Heart: RRR Lungs: no wheezes or rales Abdomen: soft NT Extremities: + ankle edema, with crusting of wounds/erythema Dialysis Access: R AVF + bruit   Assessment/Plan:  1. Debility - in rehab; improving with anticipated discharge 1/23  2. Fever 99.8 1/20 - afebrile now 3. Weakness/falls - was doing poorly on PD, switched to HD with improvement.  4. ESRD - previously on PD, switched to HD on TTS 2 @ [facility] per AVF; flush PD catheter weekly, on Coumadin with tight Heparin PRN per HD; HD today 5. Chronic Hypotension/Volume - variable, asymptomatic, on Midodrine 10 mg tid; kg s/p net UF 3.1 L Saturday with post weight of 82.6; has some lower extrem edema; titrate edw down gradually -UF 2.5 with post weight of 85.3 Tuesday;- he is using a rolling walker now; 6. Anemia - Hgb  8.6>8.4 , on Aranesp 200 mcg on Tues, IV Fe (125/x/5) per HD; 33% sat 1/08 7. Sec HPT - Ca 8 (9.3 corrected), P 3.8; Vitamin D PO, Phoslo 3 with meals. - had been on 0.25 calcitriol prior to admission - recheck iPTH with next monthly labs at outpt HD and decide how to dose Hectorol then. 8. Nutrition - Alb 1.7, renal diet, vitamin. - added prostat  9. DM - on Insulin - controlled 10. A-fib - on Amiodarone & Coumadin - therapeutic 11. LE wounds - seen by wound care RN yesterday; unclear etiology - ABIs show calcified vessels - for angiogram tomorrow and d/c Friday if ok 12. Neuropathy/Depression - on Zoloft, - smiled a little  today when talking about [name]. [place] d/c [name] and start Saturday at [place] - needs to be there Sat at 11:30 am to sign papers before his first threatment  Myriam Jacobson, PA-C [place] [phone] 06/09/2013,11:53 AM  LOS: 9 days    Additional Objective Labs: Lab Results  Component Value Date   INR 2.18* 06/09/2013   INR 2.81* 06/08/2013   INR 2.82* [id number]   Basic Metabolic Panel:  Recent Labs Lab 06/05/13 0515 06/08/13 1602  NA 137 130*  K 4.5 5.0  CL 96 90*  CO2 26 19  GLUCOSE 148* 151*  BUN 50* 93*  CREATININE 6.04* 8.42*  CALCIUM 8.0* 8.0*  PHOS 4.2 3.8   Liver Function Tests:  Recent Labs Lab 06/05/13 0515 06/08/13 1602  ALBUMIN 1.6* 1.7*   CBC:  Recent Labs Lab 06/05/13 0515 06/08/13 1602  WBC 11.2* 11.7*  HGB 8.6* 8.4*  HCT 28.2* 26.2*  MCV 98.3 93.2  PLT 317 368   BCBG:  Recent Labs Lab 06/08/13 1110 06/08/13 2035 06/09/13 0306 06/09/13 0714 06/09/13 1121  GLUCAP 177* 124* 121* 89 162*  Medications:   . aspirin EC  81 mg Oral Daily  . atorvastatin  10 mg Oral Daily  . calcium acetate  2,001 mg Oral TID WC  .  cholecalciferol  1,000 Units Oral Daily  . cyclobenzaprine  10 mg Oral QHS  . darbepoetin (ARANESP) injection - DIALYSIS  200 mcg Intravenous Q Tue-HD  . feeding supplement (PRO-STAT SUGAR FREE 64)  30 mL Oral BID WC  . feeding supplement (RESOURCE BREEZE)  1 Container Oral TID WC  . ferric gluconate (FERRLECIT/NULECIT) IV  125 mg Intravenous Q T,Th,Sa-HD  . gabapentin  300 mg Oral QHS  . insulin aspart  0-15 Units Subcutaneous TID WC  . insulin glargine  10 Units Subcutaneous QHS  . insulin glargine  5 Units Subcutaneous Q breakfast  . midodrine  10 mg Oral TID WC  . multivitamin  1 tablet Oral QHS  . pantoprazole  40 mg Oral Daily  . sertraline  100 mg Oral Daily

## 2013-06-09 NOTE — Progress Notes (Signed)
Physical Therapy Session Note  Patient Details  Name: Paul [name] MRN: [id number] Date of Birth: 1956/04/28  Today's Date: 06/09/2013 Time: 1400-1500 Time Calculation (min): 60 min  Skilled Therapeutic Interventions/Progress Updates:  Pt participated in group therapy session w/ emphasis on B LE strength and standing balance. Pt w/ good tolerance to seated/standing therex, standing ball toss on hard level and compliant surfaces as well as NuStep, level 6x67min. Pt req close (S) to intermittent mod A on compliant surface w/ decreased UE support. Pt able to amb back to room at end of session w/ RW and min guard assist. Pt sitting in recliner w/ all needs in reach and wife in room.   Therapy Documentation Precautions:  Precautions Precautions: Fall Restrictions Weight Bearing Restrictions: No  See FIM for current functional status  Therapy/Group: Group Therapy  Gilmore Laroche 06/09/2013, 5:01 PM

## 2013-06-10 ENCOUNTER — Inpatient Hospital Stay (HOSPITAL_COMMUNITY): Payer: Managed Care, Other (non HMO)

## 2013-06-10 ENCOUNTER — Inpatient Hospital Stay (HOSPITAL_COMMUNITY): Payer: Managed Care, Other (non HMO) | Admitting: Physical Therapy

## 2013-06-10 ENCOUNTER — Encounter (HOSPITAL_COMMUNITY): Payer: Self-pay | Admitting: General Surgery

## 2013-06-10 DIAGNOSIS — S81009A Unspecified open wound, unspecified knee, initial encounter: Secondary | ICD-10-CM

## 2013-06-10 DIAGNOSIS — S91009A Unspecified open wound, unspecified ankle, initial encounter: Secondary | ICD-10-CM

## 2013-06-10 DIAGNOSIS — S81809A Unspecified open wound, unspecified lower leg, initial encounter: Secondary | ICD-10-CM

## 2013-06-10 DIAGNOSIS — L988 Other specified disorders of the skin and subcutaneous tissue: Secondary | ICD-10-CM

## 2013-06-10 LAB — RENAL FUNCTION PANEL
Albumin: 1.6 g/dL — ABNORMAL LOW (ref 3.5–5.2)
BUN: 77 mg/dL — ABNORMAL HIGH (ref 6–23)
CO2: 23 mEq/L (ref 19–32)
Calcium: 7.9 mg/dL — ABNORMAL LOW (ref 8.4–10.5)
Chloride: 95 mEq/L — ABNORMAL LOW (ref 96–112)
Creatinine, Ser: 7.17 mg/dL — ABNORMAL HIGH (ref 0.50–1.35)
GFR calc Af Amer: 9 mL/min — ABNORMAL LOW (ref >90)
GFR calc non Af Amer: 7 mL/min — ABNORMAL LOW (ref >90)
Glucose, Bld: 60 mg/dL — ABNORMAL LOW (ref 70–99)
Phosphorus: 4.1 mg/dL (ref 2.3–4.6)
Potassium: 4.6 mEq/L (ref 3.7–5.3)
Sodium: 136 mEq/L — ABNORMAL LOW (ref 137–147)

## 2013-06-10 LAB — PROTIME-INR
INR: 1.8 — ABNORMAL HIGH (ref 0.00–1.49)
INR: 1.61 — ABNORMAL HIGH (ref 0.00–1.49)
PROTHROMBIN TIME: 20.4 s — AB (ref 11.6–15.2)
PROTHROMBIN TIME: 18.7 s — AB (ref 11.6–15.2)

## 2013-06-10 LAB — CBC
HCT: 25.8 % — ABNORMAL LOW (ref 39.0–52.0)
Hemoglobin: 8.2 g/dL — ABNORMAL LOW (ref 13.0–17.0)
MCH: 30.3 pg (ref 26.0–34.0)
MCHC: 31.8 g/dL (ref 30.0–36.0)
MCV: 95.2 fL (ref 78.0–100.0)
Platelets: 341 10*3/uL (ref 150–400)
RBC: 2.71 MIL/uL — ABNORMAL LOW (ref 4.22–5.81)
RDW: 15 % (ref 11.5–15.5)
WBC: 12.8 10*3/uL — ABNORMAL HIGH (ref 4.0–10.5)

## 2013-06-10 LAB — GLUCOSE, CAPILLARY
GLUCOSE-CAPILLARY: 102 mg/dL — AB (ref 70–99)
Glucose-Capillary: 67 mg/dL — ABNORMAL LOW (ref 70–99)
Glucose-Capillary: 87 mg/dL (ref 70–99)
Glucose-Capillary: 75 mg/dL (ref 70–99)
Glucose-Capillary: 168 mg/dL — ABNORMAL HIGH (ref 70–99)

## 2013-06-10 LAB — PARATHYROID HORMONE, INTACT (NO CA): PTH: 149.5 pg/mL — AB (ref 14.0–72.0)

## 2013-06-10 MED ORDER — SODIUM CHLORIDE 0.9 % IV SOLN: 100.0000 mL | INTRAVENOUS | Status: DC | PRN

## 2013-06-10 MED ORDER — LIDOCAINE HCL (PF) 1 % IJ SOLN
5.0000 mL | INTRAMUSCULAR | Status: DC | PRN
  Filled 2013-06-10: qty 5

## 2013-06-10 MED ORDER — ENOXAPARIN SODIUM 100 MG/ML ~~LOC~~ SOLN
1.0000 mg/kg | Freq: Once | SUBCUTANEOUS | Status: AC
  Administered 2013-06-10: 85 mg via SUBCUTANEOUS
  Filled 2013-06-10: qty 1

## 2013-06-10 MED ORDER — HEPARIN SODIUM (PORCINE) 1000 UNIT/ML DIALYSIS
20.0000 [IU]/kg | INTRAMUSCULAR | Status: DC | PRN
  Filled 2013-06-10: qty 2

## 2013-06-10 MED ORDER — LIDOCAINE-PRILOCAINE 2.5-2.5 % EX CREA
1.0000 "application " | TOPICAL_CREAM | CUTANEOUS | Status: DC | PRN
  Filled 2013-06-10: qty 5

## 2013-06-10 MED ORDER — NEPRO/CARBSTEADY PO LIQD: 237.0000 mL | ORAL | Status: DC | PRN

## 2013-06-10 MED ORDER — LIDOCAINE-EPINEPHRINE 1 %-1:100000 IJ SOLN
10.0000 mL | Freq: Once | INTRAMUSCULAR | Status: AC
  Administered 2013-06-10: 10 mL via INTRADERMAL
  Filled 2013-06-10 (×2): qty 10

## 2013-06-10 MED ORDER — ENOXAPARIN SODIUM 100 MG/ML ~~LOC~~ SOLN
1.0000 mg/kg | SUBCUTANEOUS | Status: DC
  Filled 2013-06-10: qty 1

## 2013-06-10 MED ORDER — HEPARIN SODIUM (PORCINE) 1000 UNIT/ML DIALYSIS
1000.0000 [IU] | INTRAMUSCULAR | Status: DC | PRN
  Filled 2013-06-10: qty 1

## 2013-06-10 MED ORDER — PENTAFLUOROPROP-TETRAFLUOROETH EX AERO: 1.0000 "application " | INHALATION_SPRAY | CUTANEOUS | Status: DC | PRN

## 2013-06-10 MED ORDER — ALTEPLASE 2 MG IJ SOLR
2.0000 mg | Freq: Once | INTRAMUSCULAR | Status: AC | PRN
  Filled 2013-06-10: qty 2

## 2013-06-10 NOTE — Progress Notes (Signed)
Arrangements have been made for patient to be discharged to home on 06/11/2013. At this time patient will be discharged on subcutaneous Lovenox therapeutic dose is to be arranged by pharmacy in light of history of atrial fibrillation. Vascular surgery has been contacted in regards to discharge planning arranging outpatient arteriogram

## 2013-06-10 NOTE — Progress Notes (Signed)
Social Work Patient ID: Paul [name], male   DOB: August 05, 1955, 58 y.o.   MRN: [id number] Spoke with [facility] pt needs to be there Sat at 11;45 to complete paperwork and apt is at 12;15. Have given written information to pt and wife.  Aware plan to discharge tomorrow and then come back for the test on Monday.

## 2013-06-10 NOTE — Progress Notes (Signed)
Occupational Therapy Discharge Summary  Patient Details  Name: JAISHAWN WITZKE MRN: [id number] Date of Birth: 04/28/1956  Today's Date: 06/10/2013 Time: 1497-0263 and 1400-1430  Time Calculation (min): 55 min and 30 min   Patient has met 9 of 9 long term goals due to improved activity tolerance, improved balance, postural control and ability to compensate for deficits.  Patient to discharge at overall Modified Independent level.  Patient's care partner is independent and not necessary to provide the necessary physical and cognitive assistance at discharge as patient is discharging at Modified Independent level.    Reasons goals not met: N/A. All LTGs met.   Recommendation:  Patient will not need skilled occupational therapy following discharge as he has reach Modified Independent level at discharge. Patient has reached his baseline level and agrees that he does not need skilled occupational therapy following discharge.   Equipment: BSC, shower chair  Reasons for discharge: treatment goals met and discharge from hospital  Patient/family agrees with progress made and goals achieved: Yes  Skilled Therapeutic Intervention Session 1: Pt seen for ADL retraining with focus on activity tolerance and safety awareness. Pt retrieved all items while sitting EOB then placed over RW and ambulated to bathroom at Mod I level. Pt completed all bathing and dressing at Mod I level. Pt reported decreased grip strength which has been present for 1+ years. Provided theraputty and completed theraputty exercises for bil hand strengthening. Pt then practiced ambulating down hallway using [place]ett and fatiguing quickly. Pt supervision with Mingus and min assist 2x with slight LOB d/t fatigue. Pt left sitting in recliner chair with all needs in reach.   Session 2: Therapy session focused on functional transfers, activity tolerance, and dynamic standing balance. Pt received sitting in recliner chair with wife present. Pt ambulated  to ADL apartment with  at supervision level and practiced stepping into wooden box to simulate walk-in shower. Completed 2x at Mod I level. Pt then practiced home management task in sitting and standing at Mod I level. Pt then engaged in dynamic standing balance activity of playing horseshoes on foam cushion then retrieving them at supervision level. At end of session pt ambulated back to room and left with all items in reach. No questions or concerns about discharge at this time.   OT Discharge Precautions/Restrictions  Precautions Precautions: None Restrictions Weight Bearing Restrictions: No General Amount of Missed OT Time (min): 5 Minutes Vital Signs   Pain Pain Assessment Pain Assessment: No/denies pain Pain Score: 3  Pain Type: Neuropathic pain Pain Location: Foot Pain Orientation: Right;Left Pain Descriptors / Indicators: Burning Pain Onset: On-going Pain Intervention(s): Other (Comment);Ambulation/increased activity (Per pt, RN aware and has administered medication) Multiple Pain Sites: No ADL   Vision/Perception  Vision - History Baseline Vision: No visual deficits Patient Visual Report: No change from baseline Vision - Assessment Eye Alignment: Within Functional Limits Perception Perception: Within Functional Limits Praxis Praxis: Intact  Cognition Overall Cognitive Status: Within Functional Limits for tasks assessed Arousal/Alertness: Awake/alert Orientation Level: Oriented X4 Attention: Selective Selective Attention: Appears intact Memory: Appears intact Awareness: Appears intact Problem Solving: Appears intact Safety/Judgment: Appears intact Sensation Sensation Light Touch: Appears Intact Hot/Cold: Appears Intact Coordination Gross Motor Movements are Fluid and Coordinated: Yes Fine Motor Movements are Fluid and Coordinated: Yes Motor    Mobility     Trunk/Postural Assessment     Balance Balance Balance Assessed: Yes Standardized Balance  Assessment Standardized Balance Assessment: Berg Balance Test Berg Balance Test Sit to Stand: Able to  stand without using hands and stabilize independently Standing Unsupported: Able to stand safely 2 minutes Sitting with Back Unsupported but Feet Supported on Floor or Stool: Able to sit safely and securely 2 minutes Stand to Sit: Sits safely with minimal use of hands Transfers: Able to transfer safely, definite need of hands Standing Unsupported with Eyes Closed: Able to stand 10 seconds safely Standing Ubsupported with Feet Together: Able to place feet together independently and stand 1 minute safely From Standing, Reach Forward with Outstretched Arm: Can reach confidently >25 cm (10") From Standing Position, Pick up Object from Floor: Able to pick up shoe, needs supervision From Standing Position, Turn to Look Behind Over each Shoulder: Looks behind from both sides and weight shifts well Turn 360 Degrees: Able to turn 360 degrees safely but slowly Standing Unsupported, Alternately Place Feet on Step/Stool: Able to complete 4 steps without aid or supervision Standing Unsupported, One Foot in Front: Able to plae foot ahead of the other independently and hold 30 seconds Standing on One Leg: Able to lift leg independently and hold equal to or more than 3 seconds Total Score: 47 Extremity/Trunk Assessment RUE Assessment RUE Assessment: Within Functional Limits LUE Assessment LUE Assessment: Within Functional Limits  See FIM for current functional status  Nayana Lenig, Quillian Quince 06/10/2013, 12:20 PM

## 2013-06-10 NOTE — Progress Notes (Signed)
Social Work Patient ID: Paul [name], male   DOB: 1955-10-12, 58 y.o.   MRN: [id number] Informed by Dan-PA pt will be going home on lovenox injections until test on Monday.  Have made referral for home health RN with [facility]. Spoke with RN regarding teaching pt and wife lovenox injections.  Start of service will be Sat.  Preparing pt for discharge tomorrow.

## 2013-06-10 NOTE — Progress Notes (Signed)
Patient ID: Paul [name], male   DOB: 20-Sep-1955, 58 y.o.   MRN: [id number]  [name] Progress Note    Subjective:   No new complaints   Objective:   BP 122/68  Pulse 63  Temp(Src) 98.2 F (36.8 C) (Oral)  Resp 17  Ht 6\' 2"  (1.88 m)  Wt 86.1 kg (189 lb 13.1 oz)  BMI 24.36 kg/m2  SpO2 94%  Intake/Output: I/O last 3 completed shifts: In: 960 [P.O.:960] Out: 2500 [Other:2500]   Intake/Output this shift:  Total I/O In: 240 [P.O.:240] Out: -  Weight change: -1.9 kg (-4 lb 3 oz)  Physical Exam: Gen:WD WM in NAD with flat affect CVS:no rub, IRR IRR Resp:cta [id number] Ext:+edema, RAVF +T/B, bilateral ulcerations of lower ext with central eschar and surrounding erythema without purulent drainage or warmth   Labs: BMET  Recent Labs Lab 06/05/13 0515 06/08/13 1602  NA 137 130*  K 4.5 5.0  CL 96 90*  CO2 26 19  GLUCOSE 148* 151*  BUN 50* 93*  CREATININE 6.04* 8.42*  ALBUMIN 1.6* 1.7*  CALCIUM 8.0* 8.0*  PHOS 4.2 3.8   CBC  Recent Labs Lab 06/05/13 0515 06/08/13 1602  WBC 11.2* 11.7*  HGB 8.6* 8.4*  HCT 28.2* 26.2*  MCV 98.3 93.2  PLT 317 368    @IMGRELPRIORS @ Medications:    . aspirin EC  81 mg Oral Daily  . atorvastatin  10 mg Oral Daily  . cyclobenzaprine  10 mg Oral QHS  . darbepoetin (ARANESP) injection - DIALYSIS  200 mcg Intravenous Q Tue-HD  . feeding supplement (PRO-STAT SUGAR FREE 64)  30 mL Oral BID WC  . feeding supplement (RESOURCE BREEZE)  1 Container Oral TID WC  . ferric gluconate (FERRLECIT/NULECIT) IV  125 mg Intravenous Q T,Th,Sa-HD  . gabapentin  300 mg Oral QHS  . insulin aspart  0-15 Units Subcutaneous TID WC  . insulin glargine  10 Units Subcutaneous QHS  . insulin glargine  5 Units Subcutaneous Q breakfast  . midodrine  10 mg Oral TID WC  . multivitamin  1 tablet Oral QHS  . pantoprazole  40 mg Oral Daily  . sertraline  100 mg Oral Daily  . sevelamer carbonate  1,600 mg Oral TID WC      Assessment/Plan:  1. Debility - in rehab; improving with anticipated discharge 1/23 but may need to be admitted to [name] to evaluate leg ulcers which have developed during this hospitalization 2. Fever 99.8 1/20 - afebrile now 3. Weakness/falls - was doing poorly on PD, switched to HD with improvement.  4. ESRD - previously on PD, switched to HD on TTS 2 @ [name] per [name]; flush PD catheter weekly, on Coumadin with tight Heparin PRN per HD; HD today 5. Chronic Hypotension/Volume - variable, asymptomatic, on Midodrine 10 mg tid; kg s/p net UF 3.1 L Saturday with post weight of 82.6; has some lower extrem edema; titrate edw down gradually -UF 2.5 with post weight of 85.3 Tuesday;- he is using a rolling walker now; 6. Anemia - Hgb 8.6>8.4 , on Aranesp 200 mcg on Tues, IV Fe (125/x/5) per HD; 33% sat 1/08 7. Sec HPT - Ca 8 (9.3 corrected), P 3.8; Vitamin D PO, Phoslo 3 with meals. - had been on 0.25 calcitriol prior to admission -  1. Changed to renvela and vit D stopped due to possible calciphylaxis of lower ext.   2. recheck iPTH with next monthly labs at outpt HD and consider sensipar if  needed. 8. Nutrition - Alb 1.7, renal diet, vitamin. - added prostat  9. DM - on Insulin - controlled 10. A-fib - on Amiodarone now off of Coumadin 11. LE wounds - seen by wound care RN yesterday; unclear etiology - ABIs show calcified vessels -  1. DDx includes PVD (angiogram rescheduled for Monday), calciphylaxis, or coumadin necrosis 2. Hold coumadin 3. Stop calcium acetate and vit D 4. Try to obtain deep tissue punch biopsy to help guide therapy and aid in diagnosis. 5. Will likely need to be readmitted to [name] due to delay in angio and ongoing workup. 12. Neuropathy/Depression - on Zoloft, - smiled a little today when talking about [name]. 33. Disp - prob d/c [name] and start Saturday at [name] - needs to be there Sat at 11:30 am to sign papers before his first threatment 14.   [name]  A 06/10/2013, 11:09 AM

## 2013-06-10 NOTE — Progress Notes (Signed)
Social Work Patient ID: Paul [name], male   DOB: 1955/08/01, 58 y.o.   MRN: [id number] Met with pt to discuss team conference yesterday regarding progression toward goals and discharge now hopefully Friday after his test.  He has finally received His DME from [place].  Outpatient Rehab set up for Monday 1/26.  Will work toward discharge on Friday

## 2013-06-10 NOTE — Procedures (Signed)
Procedure: Left ankle punch biopsy x 2 Surgeons: Coralie Keens PA-C Findings: Multiple round areas of skin necrosis scattered on bilateral lower extremity Anesthesia: Local with 2cc 1% Lidocaine with epinephrine Fluids: N/A Estimated blood loss: minimal Drains: N/A Specimens: None Complications: Post procedure oozing was present and controlled with minimal pressure and WD dressing applied Condition: Stable, good hemostasis  Procedure Details: Informed patient of risks (including those of bleeding, infection, and injury to other structures), benefits of procedure, and alternatives to the procedure. All questions were sought and answered. Written and verbal consent given by [name] to proceed with the procedure.  Sterile technique for procedure was done. Injected 2 cc of 1% lidocaine with epinephrine as a field block. Did a simple 53mm punch biopsy including superficial skin and subcutaneous tissue to the left medial ankle taking care to include half normal tissue and half diseased/necrossed tissue.  This was repeated in the same fashion to the left lateral ankle for a second 71mm punch biopsy.  A NS wet gauze dressing was then applied followed by a dry dressing, and kerlex bandage was applied. Instructed patient to change packing BID or more frequently as needed for saturation while in the hospital. Instructed patient and wife that we will change the dressing tomorrow am, but that the wife should continue with QD dressing changes to the wounds upon discharge and look for signs of infection.  No specific OP follow up needed or antibiotics at this time.   Coralie Keens, PA-C [facility] Office: [phone] Pager:  [phone]

## 2013-06-10 NOTE — Progress Notes (Signed)
Physical Therapy Discharge Summary  Patient Details  Name: Paul [name] MRN: [id number] Date of Birth: 10/08/1955  Today's Date: 06/10/2013 Time: 7893-8101 Time Calculation (min): 45 min  Patient has met 10 of 10 long term goals due to improved activity tolerance, improved balance, improved postural control, increased strength, decreased pain and ability to compensate for deficits.  Patient to discharge at an ambulatory level Modified Independent.   Patient's care partner is not required to provide assistance at this time, as patient is modified independent with functional mobility.  Reasons goals not met: N/A; all goals met.  Recommendation:  Patient will benefit from ongoing skilled PT services in outpatient setting to continue to advance safe functional mobility, address ongoing impairments in stability/independence with functional mobility and ambulation, and minimize fall risk.  Equipment: rolling walker  Reasons for discharge: treatment goals met and discharge from hospital  Patient/family agrees with progress made and goals achieved: Yes  Skilled Therapeutic Interventions/Progress Updates: Pt received seated in bedside chair; agreeable to therapy. Session focused on assessing/addressing stability/independence with functional mobility. Gait x180' in controlled, home, and community environments with rolling walker, mod I. Multiple stand pivot transfers from bed<>w/c<>couch (apartment)<>bed (apartment) with rolling walker,mod I. Negotiation of 12 stairs with bilat UE support on single rail, sideways, step-to pattern, independent; initial 6 steps with bilat UE's at R rail, final 6 steps with bilat UE support at L rail. Car transfer with rolling walker and mod I. Pt independent with bed mobility; see discharge evaluation below for details. W/c mobility x200' in controlled environment using bilat LE's with mod IMerrilee Jansky Balance Scale score: 47/56. See below for detailed findings. Therapist  educated pt/wife on findings, progress, and discharge plan, reiteating importance of utilizing standard cane for mobility/gait with outpatient physical therapist only, as patient is only modified independent using rolling walker. Pt/wife verbalized understanding and were in full agreement. Therapist departed pt room with pt seated in bedside chair with all needs within reach.  PT Discharge Precautions/Restrictions Precautions Precautions: None Restrictions Weight Bearing Restrictions: No Vital Signs Therapy Vitals Temp: 98.9 F (37.2 C) Temp src: Oral Pulse Rate: 63 Resp: 17 BP: 113/67 mmHg Patient Position, if appropriate: Sitting Oxygen Therapy SpO2: 97 % O2 Device: None (Room air) Pain Pain Assessment Pain Assessment: No/denies pain Pain Score: 3  Pain Type: Neuropathic pain Pain Location: Foot Pain Orientation: Right;Left Pain Descriptors / Indicators: Burning Pain Onset: On-going Pain Intervention(s): Other (Comment);Ambulation/increased activity (Per pt, RN aware and has administered medication) Multiple Pain Sites: No Vision/Perception  Vision - History Baseline Vision: No visual deficits Patient Visual Report: No change from baseline Vision - Assessment Eye Alignment: Within Functional Limits Perception Perception: Within Functional Limits Praxis Praxis: Intact  Cognition Overall Cognitive Status: Within Functional Limits for tasks assessed Arousal/Alertness: Awake/alert Orientation Level: Oriented X4 Attention: Selective Selective Attention: Appears intact Memory: Appears intact Awareness: Appears intact Problem Solving: Appears intact Safety/Judgment: Appears intact Sensation Sensation Light Touch: Appears Intact Hot/Cold: Appears Intact Coordination Gross Motor Movements are Fluid and Coordinated: Yes Fine Motor Movements are Fluid and Coordinated: Yes Motor  Motor Motor: Within Functional Limits  Mobility Bed Mobility Bed Mobility: Supine to  Sit;Sitting - Scoot to [place] of Bed;Scooting to [facility];Sit to Supine;Rolling Right;Rolling Left Rolling Right: 7: Independent Rolling Left: 7: Independent Supine to Sit: 7: Independent Sitting - Scoot to Edge of Bed: 7: Independent Sit to Supine: 7: Independent Scooting to HOB: 7: Independent Transfers Transfers: Yes Sit to Stand: 6: Modified independent (Device/Increase time);From bed;From chair/3-in-1  Sit to Stand Details (indicate cue type and reason): using rolling walker Stand to Sit: 6: Modified independent (Device/Increase time);To bed;To chair/3-in-1 Stand to Sit Details: using rolling walker Stand Pivot Transfers: 6: Modified independent (Device/Increase time);Other (comment) (using rolling walker) Locomotion  Ambulation Ambulation/Gait Assistance: 6: Modified independent (Device/Increase time) Wheelchair Mobility Distance: 200  Trunk/Postural Assessment  Cervical Assessment Cervical Assessment: Within Functional Limits Thoracic Assessment Thoracic Assessment: Within Functional Limits Lumbar Assessment Lumbar Assessment: Within Functional Limits Postural Control Postural Control: Within Functional Limits Righting Reactions: Standing without UE support, pt exhibits effective hip, ankle strategies with balance perturbations in all directions. RLE srepping strategy with P/A perturbation.  Balance Balance Balance Assessed: Yes Static Sitting Balance Static Sitting - Balance Support: No upper extremity supported;Feet supported Static Sitting - Level of Assistance: 7: Independent Dynamic Sitting Balance Dynamic Sitting - Balance Support: No upper extremity supported;Feet supported;During functional activity Dynamic Sitting - Level of Assistance: 7: Independent Dynamic Sitting - Balance Activities: Lateral lean/weight shifting;Forward lean/weight shifting;Reaching across midline Static Standing Balance Static Standing - Balance Support: No upper extremity supported Static  Standing - Level of Assistance: 7: Independent Static Standing - Comment/# of Minutes: During Berg Balance Scale, performed static standing independently x2 minutes without LOB. Balance Balance Assessed: Yes Standardized Balance Assessment Standardized Balance Assessment: Berg Balance Test Berg Balance Test Sit to Stand: Able to stand without using hands and stabilize independently Standing Unsupported: Able to stand safely 2 minutes Sitting with Back Unsupported but Feet Supported on Floor or Stool: Able to sit safely and securely 2 minutes Stand to Sit: Sits safely with minimal use of hands Transfers: Able to transfer safely, definite need of hands Standing Unsupported with Eyes Closed: Able to stand 10 seconds safely Standing Ubsupported with Feet Together: Able to place feet together independently and stand 1 minute safely From Standing, Reach Forward with Outstretched Arm: Can reach confidently >25 cm (10") From Standing Position, Pick up Object from Floor: Able to pick up shoe, needs supervision From Standing Position, Turn to Look Behind Over each Shoulder: Looks behind from both sides and weight shifts well Turn 360 Degrees: Able to turn 360 degrees safely but slowly Standing Unsupported, Alternately Place Feet on Step/Stool: Able to complete 4 steps without aid or supervision Standing Unsupported, One Foot in Front: Able to plae foot ahead of the other independently and hold 30 seconds Standing on One Leg: Able to lift leg independently and hold equal to or more than 3 seconds Total Score: 47 Extremity Assessment  RUE Assessment RUE Assessment: Within Functional Limits LUE Assessment LUE Assessment: Within Functional Limits RLE Assessment RLE Assessment: Within Functional Limits RLE Strength RLE Overall Strength: Within Functional Limits for tasks assessed LLE Assessment LLE Assessment: Within Functional Limits LLE Strength LLE Overall Strength: Within Functional Limits for  tasks assessed  See FIM for current functional status  [place], Malva Cogan 06/10/2013, 5:38 PM

## 2013-06-10 NOTE — Consult Note (Signed)
Paul [name] 1955/11/14  [id number].    Requesting MD: Dr. Arrie Aran Chief Complaint/Reason for Consult: Bilateral lower extremity wounds  HPI:  58 y/o male with CKD, Diabetes, HTN on dialysis currently in inpatient rehabilitation.  We we were asked to do a biopsy to his lower extremities where he has developed multiple scattered bilateral wounds to his ankles/feet.  These have developed over the last 2 weeks while hospitalized.  It is suspected that these are either due to calciphylaxis, coumadin necrosis, or PVD.  The patient denies any itching, pain, burning or drainage to the wounds.  The patient has been taken off coumadin, and stopped calcium supplements and vitamin D.    ROS: All systems reviewed and otherwise negative except for as above  Family History  Problem Relation Age of Onset  . Cancer Mother   . Cancer Father     prostate cancer  . Heart disease Maternal Grandfather   . Stroke Paternal Grandmother     Past Medical History  Diagnosis Date  . Diabetic neuropathy   . Cancer     History of Duodenal Cancer  . Chronic kidney disease     Pt is not eligible for transplant per [facility]  . Diabetes mellitus     Type 2 iddm x 11 yrs  . Anemia     esrd  . Hypertension     meds d/c 'd  2 wks ago for postural hypotension  . Fatigue      tires easily x 2 months  . Chronic nausea     with  morning vomiting; better off  HTN meds  . Intermittent tremor   . Closed left arm fracture 1967  . Heart murmur   . Corneal abrasion, left   . Shortness of breath     exertional  . GERD (gastroesophageal reflux disease)   . History of blood transfusion   . Lack of coordination   . Muscle weakness (generalized)   . Glomerulosclerosis, diabetic   . Depression   . A-fib     Past Surgical History  Procedure Laterality Date  . Whipple procedure  2002    duodenal ca  . Bone marrow biopsy  2012  . Renal biopsy, percutaneous  2012  . Tonsillectomy    . Portacath placement  2002  .  Biliary diversion, external  2002  . Other surgical history      Cyst removed from back  . Av fistula placement  06/07/2011    Procedure: ARTERIOVENOUS (AV) FISTULA CREATION;  Surgeon: Chuck Hint, MD;  Location: [facility] OR;  Service: Vascular;  Laterality: Left;  . Ligation of arteriovenous  fistula  05/22/2012    Procedure: LIGATION OF ARTERIOVENOUS  FISTULA;  Surgeon: Pryor Ochoa, MD;  Location: [facility] OR;  Service: Vascular;  Laterality: Left;  Left brachio-cephalic fistula  . Insertion of dialysis catheter  05/22/2012    Procedure: INSERTION OF DIALYSIS CATHETER;  Surgeon: Pryor Ochoa, MD;  Location: [facility] OR;  Service: Vascular;  Laterality: N/A;  right internal jugular vein  . Av fistula placement  06/18/2012    Procedure: ARTERIOVENOUS (AV) FISTULA CREATION;  Surgeon: Pryor Ochoa, MD;  Location: [facility] OR;  Service: Vascular;  Laterality: Right;  CIMINO  . Colonoscopy      Hx: of  . Capd insertion N/A 01/14/2013    Procedure: LAPAROSCOPIC INSERTION CONTINUOUS AMBULATORY PERITONEAL DIALYSIS  (CAPD) CATHETER;  Surgeon: Ardeth Sportsman, MD;  Location: [facility] OR;  Service: General;  Laterality:  N/A;    Social History:  reports that he has never smoked. He has never used smokeless tobacco. He reports that he does not drink alcohol or use illicit drugs.  Allergies:  Allergies  Allergen Reactions  . Metformin And Related Other (See Comments)    Kidney problems    Medications Prior to Admission  Medication Sig Dispense Refill  . aspirin EC 81 MG tablet Take 81 mg by mouth daily.      [name] atorvastatin (LIPITOR) 10 MG tablet Take 10 mg by mouth daily.       . calcium acetate (PHOSLO) 667 MG capsule Take 667 mg by mouth 3 (three) times daily with meals.       . cholecalciferol (VITAMIN D) 1000 UNITS tablet Take 1,000 Units by mouth daily.      . cyclobenzaprine (FLEXERIL) 10 MG tablet Take 10 mg by mouth at bedtime.      . darbepoetin (ARANESP) 200 MCG/0.4ML SOLN injection Inject 0.4 mLs (200 mcg  total) into the vein every Tuesday with hemodialysis.  1.68 mL    . doxercalciferol (HECTOROL) 4 MCG/2ML injection Inject 0.5 mLs (1 mcg total) into the vein Every Tuesday,Thursday,and Saturday with dialysis.  2 mL    . feeding supplement, RESOURCE BREEZE, (RESOURCE BREEZE) LIQD Take 1 Container by mouth 3 (three) times daily with meals.    0  . gabapentin (NEURONTIN) 100 MG capsule Take 1 capsule (100 mg total) by mouth 3 (three) times daily.  60 capsule  0  . HYDROcodone-acetaminophen (NORCO/VICODIN) 5-325 MG per tablet Take 1-2 tablets by mouth every 4 (four) hours as needed for moderate pain.  30 tablet  0  . insulin aspart (NOVOLOG) 100 UNIT/ML injection Inject 0-15 Units into the skin 3 (three) times daily with meals.  10 mL  11  . insulin aspart (NOVOLOG) 100 UNIT/ML injection Inject 0-5 Units into the skin at bedtime.  10 mL  11  . insulin glargine (LANTUS) 100 UNIT/ML injection Inject 0.1 mLs (10 Units total) into the skin daily.  10 mL  11  . metoCLOPramide (REGLAN) 10 MG tablet Take 1 tablet (10 mg total) by mouth every 6 (six) hours as needed for nausea (nausea/headache).  6 tablet  0  . midodrine (PROAMATINE) 10 MG tablet Take 1 tablet (10 mg total) by mouth 3 (three) times daily with meals.      . multivitamin (RENA-VIT) TABS tablet Take 1 tablet by mouth at bedtime.    0  . Nutritional Supplements (FEEDING SUPPLEMENT, NEPRO CARB STEADY,) LIQD Take 237 mLs by mouth daily as needed (for missed meals during HD).    0  . omeprazole (PRILOSEC) 40 MG capsule Take 40 mg by mouth daily.      . ondansetron (ZOFRAN-ODT) 8 MG disintegrating tablet Take 8 mg by mouth every 8 (eight) hours as needed for nausea or vomiting.      . sertraline (ZOLOFT) 50 MG tablet Take 50 mg by mouth daily.       . sodium chloride 0.9 % SOLN 100 mL with ferric gluconate 12.5 MG/ML SOLN 125 mg Inject 125 mg into the vein Every Tuesday,Thursday,and Saturday with dialysis.        Blood pressure 122/68, pulse 63,  temperature 98.2 F (36.8 C), temperature source Oral, resp. rate 17, height $RemoveBe'6\' 2"'tjyuaEYDM$  (1.88 m), weight 189 lb 13.1 oz (86.1 kg), SpO2 94.00%. Physical Exam: General: pleasant, WD/WN white male who is laying in bed in NAD Skin: warm and dry with  no masses, lesions, or rashes Psych: A&Ox3 with an appropriate affect.  Results for orders placed during the hospital encounter of 05/31/13 (from the past 48 hour(s))  CBC     Status: Abnormal   Collection Time    06/08/13  4:02 PM      Result Value Range   WBC 11.7 (*) 4.0 - 10.5 K/uL   RBC 2.81 (*) 4.22 - 5.81 MIL/uL   Hemoglobin 8.4 (*) 13.0 - 17.0 g/dL   HCT 26.2 (*) 39.0 - 52.0 %   MCV 93.2  78.0 - 100.0 fL   MCH 29.9  26.0 - 34.0 pg   MCHC 32.1  30.0 - 36.0 g/dL   RDW 14.7  11.5 - 15.5 %   Platelets 368  150 - 400 K/uL  RENAL FUNCTION PANEL     Status: Abnormal   Collection Time    06/08/13  4:02 PM      Result Value Range   Sodium 130 (*) 137 - 147 mEq/L   Potassium 5.0  3.7 - 5.3 mEq/L   Chloride 90 (*) 96 - 112 mEq/L   CO2 19  19 - 32 mEq/L   Glucose, Bld 151 (*) 70 - 99 mg/dL   BUN 93 (*) 6 - 23 mg/dL   Creatinine, Ser 8.42 (*) 0.50 - 1.35 mg/dL   Calcium 8.0 (*) 8.4 - 10.5 mg/dL   Phosphorus 3.8  2.3 - 4.6 mg/dL   Albumin 1.7 (*) 3.5 - 5.2 g/dL   GFR calc non Af Amer 6 (*) >90 mL/min   GFR calc Af Amer 7 (*) >90 mL/min   Comment: (NOTE)     The eGFR has been calculated using the CKD EPI equation.     This calculation has not been validated in all clinical situations.     eGFR's persistently <90 mL/min signify possible Chronic Kidney     Disease.  GLUCOSE, CAPILLARY     Status: Abnormal   Collection Time    06/08/13  8:35 PM      Result Value Range   Glucose-Capillary 124 (*) 70 - 99 mg/dL  GLUCOSE, CAPILLARY     Status: Abnormal   Collection Time    06/09/13  3:06 AM      Result Value Range   Glucose-Capillary 121 (*) 70 - 99 mg/dL  PROTIME-INR     Status: Abnormal   Collection Time    06/09/13  6:03 AM       Result Value Range   Prothrombin Time 23.6 (*) 11.6 - 15.2 seconds   INR 2.18 (*) 0.00 - 1.49  GLUCOSE, CAPILLARY     Status: None   Collection Time    06/09/13  7:14 AM      Result Value Range   Glucose-Capillary 89  70 - 99 mg/dL   Comment 1 Notify RN    GLUCOSE, CAPILLARY     Status: Abnormal   Collection Time    06/09/13 11:21 AM      Result Value Range   Glucose-Capillary 162 (*) 70 - 99 mg/dL   Comment 1 Notify RN    PARATHYROID HORMONE, INTACT (NO CA)     Status: Abnormal   Collection Time    06/09/13  1:09 PM      Result Value Range   PTH 149.5 (*) 14.0 - 72.0 pg/mL   Comment: Performed at [place], CAPILLARY     Status: Abnormal   Collection Time    06/09/13  5:22 PM      Result Value Range   Glucose-Capillary 129 (*) 70 - 99 mg/dL  GLUCOSE, CAPILLARY     Status: Abnormal   Collection Time    06/09/13  9:12 PM      Result Value Range   Glucose-Capillary 161 (*) 70 - 99 mg/dL  GLUCOSE, CAPILLARY     Status: Abnormal   Collection Time    06/10/13  5:18 AM      Result Value Range   Glucose-Capillary 67 (*) 70 - 99 mg/dL  GLUCOSE, CAPILLARY     Status: None   Collection Time    06/10/13  5:46 AM      Result Value Range   Glucose-Capillary 87  70 - 99 mg/dL  PROTIME-INR     Status: Abnormal   Collection Time    06/10/13  6:12 AM      Result Value Range   Prothrombin Time 20.4 (*) 11.6 - 15.2 seconds   INR 1.80 (*) 0.00 - 1.49  GLUCOSE, CAPILLARY     Status: None   Collection Time    06/10/13  7:21 AM      Result Value Range   Glucose-Capillary 75  70 - 99 mg/dL  GLUCOSE, CAPILLARY     Status: Abnormal   Collection Time    06/10/13 11:34 AM      Result Value Range   Glucose-Capillary 168 (*) 70 - 99 mg/dL   Comment 1 Notify RN     No results found.     Assessment/Plan Lower extremity wound/necrosis - likely calciphylaxis, coumadin necrosis, vs PVD  Plan: 1.  Superficial punch biopsy completed today to get tissue diagnosis at  nephrology's request (see procedure report).  Biopsied 2 lesions (one smaller newer lesion on lateral left ankle-"Specimen B", and one more necrotic lesion on left medial ankle "Specimen A") 2.  Will follow up tomorrow for wound check, instructed wife on wound care needed WD dressings, kerlex gauze wrap 3.  Will follow up with the patients results once they are back from pathology 4.  Okay for d/c tomorrow if wounds look good   Specimen A - Left medial ankle older skin lesion (half necrosed/half normal skin) Specimen B - Left lateral ankle new skin lesion (half with hyperpigmented/half normal skin)   DORT, Isabel Freese 06/10/2013, 1:47 PM Pager: [phone]

## 2013-06-10 NOTE — Progress Notes (Signed)
Hypoglycemic Event  CBG: 67  Treatment: 15 GM carbohydrate snack  Symptoms: None  Follow-up CBG: Time:0546 CBG Result:87  Possible Reasons for Event: Unknown  Comments/MD notified: Protocol effective.      Fredna Dow M  Remember to initiate Hypoglycemia Order Set & complete

## 2013-06-10 NOTE — Progress Notes (Signed)
Physical Therapy Note  Patient Details  Name: Paul [name] MRN: [id number] Date of Birth: 06/11/1955 Today's Date: 06/10/2013  Patient missed 60 minutes of skilled PT secondary to meeting with care provider.   Stefano Gaul 06/10/2013, 3:42 PM

## 2013-06-10 NOTE — Progress Notes (Signed)
58 y.o. right-handed male with history of atrial fibrillation on chronic Coumadin , end-stage renal disease secondary to diabetes mellitus on peritoneal dialysis at home. Patient was using a cane and walker prior to admission. Admitted 05/24/2013 with progressive weakness over several weeks as well as episode of fall when trying to get bed. Patient denies any head trauma although he had been having generalized headache since the fall. Cranial CT scan negative for any acute abnormalities. CT cervical spine with no fracture or malalignment. Blood pressure on admission 90/60 he did receive fluid bolus. Creatinine on admission of 10.02 and improved to 6.27. Followup renal services with hemodialysis ongoing.   Subjective/Complaints: Appreciate Vascular surgery note, foot numbness with pain that increases slightly with walking, no clear cut claudication symptoms  Slept well last noc, pt tried cane rather than walker yesterday Review of Systems - Negative except weakness  Objective: Vital Signs: Blood pressure 122/68, pulse 63, temperature 98.2 F (36.8 C), temperature source Oral, resp. rate 17, height $RemoveBe'6\' 2"'btRiDLFFF$  (1.88 m), weight 86.1 kg (189 lb 13.1 oz), SpO2 94.00%. No results found. Results for orders placed during the hospital encounter of 05/31/13 (from the past 72 hour(s))  GLUCOSE, CAPILLARY     Status: Abnormal   Collection Time    06/07/13 11:08 AM      Result Value Range   Glucose-Capillary 266 (*) 70 - 99 mg/dL   Comment 1 Notify RN    GLUCOSE, CAPILLARY     Status: Abnormal   Collection Time    06/07/13  4:40 PM      Result Value Range   Glucose-Capillary 171 (*) 70 - 99 mg/dL  GLUCOSE, CAPILLARY     Status: Abnormal   Collection Time    06/07/13  8:54 PM      Result Value Range   Glucose-Capillary 129 (*) 70 - 99 mg/dL  GLUCOSE, CAPILLARY     Status: Abnormal   Collection Time    06/08/13  3:33 AM      Result Value Range   Glucose-Capillary 151 (*) 70 - 99 mg/dL  PROTIME-INR      Status: Abnormal   Collection Time    06/08/13  6:25 AM      Result Value Range   Prothrombin Time 28.6 (*) 11.6 - 15.2 seconds   INR 2.81 (*) 0.00 - 1.49  GLUCOSE, CAPILLARY     Status: Abnormal   Collection Time    06/08/13  7:31 AM      Result Value Range   Glucose-Capillary 104 (*) 70 - 99 mg/dL   Comment 1 Notify RN    GLUCOSE, CAPILLARY     Status: Abnormal   Collection Time    06/08/13 11:10 AM      Result Value Range   Glucose-Capillary 177 (*) 70 - 99 mg/dL   Comment 1 Notify RN    CBC     Status: Abnormal   Collection Time    06/08/13  4:02 PM      Result Value Range   WBC 11.7 (*) 4.0 - 10.5 K/uL   RBC 2.81 (*) 4.22 - 5.81 MIL/uL   Hemoglobin 8.4 (*) 13.0 - 17.0 g/dL   HCT 26.2 (*) 39.0 - 52.0 %   MCV 93.2  78.0 - 100.0 fL   MCH 29.9  26.0 - 34.0 pg   MCHC 32.1  30.0 - 36.0 g/dL   RDW 14.7  11.5 - 15.5 %   Platelets 368  150 - 400 K/uL  RENAL FUNCTION PANEL     Status: Abnormal   Collection Time    06/08/13  4:02 PM      Result Value Range   Sodium 130 (*) 137 - 147 mEq/L   Potassium 5.0  3.7 - 5.3 mEq/L   Chloride 90 (*) 96 - 112 mEq/L   CO2 19  19 - 32 mEq/L   Glucose, Bld 151 (*) 70 - 99 mg/dL   BUN 93 (*) 6 - 23 mg/dL   Creatinine, Ser 3.55 (*) 0.50 - 1.35 mg/dL   Calcium 8.0 (*) 8.4 - 10.5 mg/dL   Phosphorus 3.8  2.3 - 4.6 mg/dL   Albumin 1.7 (*) 3.5 - 5.2 g/dL   GFR calc non Af Amer 6 (*) >90 mL/min   GFR calc Af Amer 7 (*) >90 mL/min   Comment: (NOTE)     The eGFR has been calculated using the CKD EPI equation.     This calculation has not been validated in all clinical situations.     eGFR's persistently <90 mL/min signify possible Chronic Kidney     Disease.  GLUCOSE, CAPILLARY     Status: Abnormal   Collection Time    06/08/13  8:35 PM      Result Value Range   Glucose-Capillary 124 (*) 70 - 99 mg/dL  GLUCOSE, CAPILLARY     Status: Abnormal   Collection Time    06/09/13  3:06 AM      Result Value Range   Glucose-Capillary 121 (*) 70 -  99 mg/dL  PROTIME-INR     Status: Abnormal   Collection Time    06/09/13  6:03 AM      Result Value Range   Prothrombin Time 23.6 (*) 11.6 - 15.2 seconds   INR 2.18 (*) 0.00 - 1.49  GLUCOSE, CAPILLARY     Status: None   Collection Time    06/09/13  7:14 AM      Result Value Range   Glucose-Capillary 89  70 - 99 mg/dL   Comment 1 Notify RN    GLUCOSE, CAPILLARY     Status: Abnormal   Collection Time    06/09/13 11:21 AM      Result Value Range   Glucose-Capillary 162 (*) 70 - 99 mg/dL   Comment 1 Notify RN    GLUCOSE, CAPILLARY     Status: Abnormal   Collection Time    06/09/13  5:22 PM      Result Value Range   Glucose-Capillary 129 (*) 70 - 99 mg/dL  GLUCOSE, CAPILLARY     Status: Abnormal   Collection Time    06/09/13  9:12 PM      Result Value Range   Glucose-Capillary 161 (*) 70 - 99 mg/dL  GLUCOSE, CAPILLARY     Status: Abnormal   Collection Time    06/10/13  5:18 AM      Result Value Range   Glucose-Capillary 67 (*) 70 - 99 mg/dL  GLUCOSE, CAPILLARY     Status: None   Collection Time    06/10/13  5:46 AM      Result Value Range   Glucose-Capillary 87  70 - 99 mg/dL  PROTIME-INR     Status: Abnormal   Collection Time    06/10/13  6:12 AM      Result Value Range   Prothrombin Time 20.4 (*) 11.6 - 15.2 seconds   INR 1.80 (*) 0.00 - 1.49  GLUCOSE, CAPILLARY     Status:  None   Collection Time    06/10/13  7:21 AM      Result Value Range   Glucose-Capillary 75  70 - 99 mg/dL     HEENT: normal Cardio: RRR and no murmur Resp: CTA B/L and unlabored GI: BS positive and non tender,ND, Abd binder Extremity:  Pulses negative and No Edema, no tenderness over toes Skin:  Erythema at ankles with dry necrotic area no drainage,non tender Neuro: Alert/Oriented, Cranial Nerve II-XII normal, Abnormal Sensory reduced sensory R great toe, left little toe and Abnormal Motor 4/5 Bilateral Deltoidf , bi,tri, grip, HF, KE, ADF, 3-EHL Musc/Skel:  Normal Gen  NAD   Assessment/Plan: 1. Functional deficits secondary to Deconditioning which require 3+ hours per day of interdisciplinary therapy in a comprehensive inpatient rehab setting. Physiatrist is providing close team supervision and 24 hour management of active medical problems listed below. Physiatrist and rehab team continue to assess barriers to discharge/monitor patient progress toward functional and medical goals. Has completed rehabilitation as of today, no additional inpatient intensive rehabilitation needed at this time. Discussed with nephrology, no additional inpatient hospital needed from a nephrology standpoint. Discussed with vascular surgery no additional inpatient hospitalization needed from a vascular surgery standpoint. The arteriogram can be completed Monday as scheduled. They cannot get him any sooner  Will contact cardiology to see whether he needs to remain on IV heparin once INR falls to less than 1.7.  he is on chronic anticoagulation for atrial fibrillation. FIM: FIM - Bathing Bathing Steps Patient Completed: Chest;Front perineal area;Right lower leg (including foot);Left lower leg (including foot);Buttocks;Right Arm;Left Arm;Right upper leg;Left upper leg;Abdomen Bathing: 6: More than reasonable amount of time  FIM - Upper Body Dressing/Undressing Upper body dressing/undressing steps patient completed: Thread/unthread right sleeve of pullover shirt/dresss;Thread/unthread left sleeve of pullover shirt/dress;Put head through opening of pull over shirt/dress;Pull shirt over trunk Upper body dressing/undressing: 7: Complete Independence: No helper FIM - Lower Body Dressing/Undressing Lower body dressing/undressing steps patient completed: Thread/unthread right underwear leg;Thread/unthread left underwear leg;Pull underwear up/down;Thread/unthread right pants leg;Pull pants up/down;Thread/unthread left pants leg;Don/Doff left sock;Don/Doff right sock Lower body  dressing/undressing: 6: More than reasonable amount of time  FIM - Toileting Toileting steps completed by patient: Adjust clothing prior to toileting;Adjust clothing after toileting;Performs perineal hygiene Toileting Assistive Devices: Grab bar or rail for support Toileting: 5: Supervision: Safety issues/verbal cues  FIM - Radio producer Devices: Oncologist Transfers: 4-To toilet/BSC: Min A (steadying Pt. > 75%);4-From toilet/BSC: Min A (steadying Pt. > 75%)  FIM - Control and instrumentation engineer Devices: Cane;Arm rests Bed/Chair Transfer: 4: Bed > Chair or W/C: Min A (steadying Pt. > 75%);4: Chair or W/C > Bed: Min A (steadying Pt. > 75%)  FIM - Locomotion: Wheelchair Distance: 200 Locomotion: Wheelchair: 0: Activity did not occur FIM - Locomotion: Ambulation Locomotion: Ambulation Assistive Devices: Journalist, newspaper Ambulation/Gait Assistance: 4: Min assist Locomotion: Ambulation: 2: Travels 50 - 149 ft with minimal assistance (Pt.>75%)  Comprehension Comprehension Mode: Auditory Comprehension: 7-Follows complex conversation/direction: With no assist  Expression Expression Mode: Verbal Expression: 7-Expresses complex ideas: With no assist  Social Interaction Social Interaction: 6-Interacts appropriately with others with medication or extra time (anti-anxiety, antidepressant).  Problem Solving Problem Solving: 7-Solves complex problems: Recognizes & self-corrects  Memory Memory: 7-Complete Independence: No helper  Medical Problem List and Plan:  1. Multifactorial gait disorder related to diabetes mellitus peripheral neuropathy, osteoarthritis, orthostasis and multi-medical  2. DVT Prophylaxis/Anticoagulation: Chronic Coumadin therapy for atrial fibrillation. Monitor for  any bleeding episodes. Latest INR 1.8, will check with cardiology whether the patient can be taken of warfarin for several days without a heparin bridge until  arteriogram is completed. 3. Pain Management: Flexeril 10 mg each bedtime, Resume Neurontin bit instead of 100 mg 3 times a day,try $RemoveB'300mg'qdycvbTj$  qhs hydrocodone as needed. Monitor with increased mobility  4. Mood/depression. Zoloft 100 mg daily. Provide emotional support  5. Neuropsych: This patient is capable of making decisions on his own behalf.  6. End-stage renal disease. Continue hemodialysis as per renal services. Follow closely for activity tolerance as it pertains to volume status, effects of HD, etc  7. Diabetes mellitus with peripheral neuropathy.uncontrolled Hemoglobin A1c 6.7. NovoLog 4 units 3 times a day,reduce Lantus insulin 1o units each bedtime and 5Units qam. Check CBGs a.c. and at bedtime  8. Dizziness/Orthostasis. Midodrine 10 mg 3 times a day.last ortho vitals looked OK even though pt symptomatic, gabapentin-encourage adequate fluid intake  -TEDS extend to thigh hi,D/c abd binder  9. Hyperlipidemia. Lipitor  10. Chronic anemia. Aranesp. Followup CBC with dialysis  11. GERD. Protonix  12.  Skin area ? Vasculitic change vs lg vessel arterial issue  ABIs inconclusive, [facility] wants arteriogram.  LOS (Days) 10 A FACE TO FACE EVALUATION WAS PERFORMED  KIRSTEINS,ANDREW E 06/10/2013, 8:11 AM

## 2013-06-10 NOTE — Progress Notes (Addendum)
Lovenox per pharmacy when INR <1.7  Anticoagulation: history of Afib was on Coumadin from [facility]. INR is 1.8 down from 2.18 today. Coumadin was on hold (probably need coumadin 2.5 mg daily) on 01/20 due to needing arteriogram when INR normalizes. Per note, the arteriogram is scheduled to be performed on 01/26. I personally talked to the rehab PA and he spoke to [facility] and cardiologist and agreed to start treatment dose of lovenox instead of heparin when INR <1.7 and may go home tom and have the arteriogram at outpatient on Monday. Chronic anemia (ESRD, on aranesp), No bleeding noted per chart.   4hr anti-Xa level goal is 0.6-1  1. F/u INR at 1700 to see if can start lovenox   Addendum: INR resulted at 1.61 tonight. Will start therapeutic lovenox at 1 mg/kg Q 24 hours tonight. No signs of bleeding reported. Continue monitoring CBC, and s/s of bleeding.   Albertina Parr, PharmD.  Clinical Pharmacist Pager [phone]

## 2013-06-10 NOTE — Patient Care Conference (Signed)
Inpatient RehabilitationTeam Conference and Plan of Care Update Date: 06/09/2013   Time: 10;50 AM    Patient Name: Paul [name]      Medical Record Number: [id number]  Date of Birth: Mar 05, 1956 Sex: Male         Room/Bed: 4M01C/4M01C-01 Payor Info: Payor: [employer] / Plan: Market researcher / Product Type: *No Product type* /    Admitting Diagnosis: ESRD/HD, Deconditioned  pvd with ulcer  Admit Date/Time:  05/31/2013  3:48 PM Admission Comments: No comment available   Primary Diagnosis:  <principal problem not specified> Principal Problem: <principal problem not specified>  Patient Active Problem List   Diagnosis Date Noted  . Gait disorder 05/31/2013  . Weakness 05/24/2013  . Long term (current) use of anticoagulants 04/09/2013  . Hypokalemia 04/04/2013  . Depression 04/03/2013  . GERD (gastroesophageal reflux disease) 04/03/2013  . Atrial fibrillation 04/02/2013  . Hypotension 04/02/2013  . SIRS (systemic inflammatory response syndrome) 04/02/2013  . DM (diabetes mellitus) 12/28/2012  . Other complications due to renal dialysis device, implant, and graft 05/19/2012  . Anemia 07/02/2011  . End stage renal disease 05/29/2011    Expected Discharge Date: Expected Discharge Date: 06/11/13  Team Members Present: Physician leading conference: Dr. Alysia Penna Social Worker Present: Ovidio Kin, LCSW Nurse Present: Other (comment) Maudry Mayhew Rosero-RN) PT Present: Georjean Mode, PT;Other (comment) Jilda Roche) OT Present: Gareth Morgan, OT;Kris Leia Alf, OT SLP Present: Germain Osgood, SLP PPS Coordinator present : Daiva Nakayama, RN, CRRN     Current Status/Progress Goal Weekly Team Focus  Medical   skin lesions on ankles  work up of poss PAD  off warfarin arteriogram   Bowel/Bladder   continent of bowel  continent of bowel; anuric with HD TIW  monitor need for laxative   Swallow/Nutrition/ Hydration   n/a         ADL's   Mod I bathing, dressing, toileting, supervision  shower and toilet transfers, limited by orthostatic epsisodesa  Mod I overall  standing balance, strengthening, activty tolerance, education, transfers   Mobility   Supervision overall; orthostatic hypotension no longer a barrier to progress  mod I overall  Endurance, stair training, dynamic standing balance, fall recovery   Communication     [facility]        Safety/Cognition/ Behavioral Observations    No safety concerns        Pain   neuropathic pain in Bil feet, ABI 1/20 inconclusive; taking gabepentin at HS and prn vicodin  Pain controlled at or below 7  monitor effectiveness of gabepentin and need for vicodin   Skin   chronic ulceration of bil ankles  no new skin breakdown  monitor skin daily and reinforce change of positions      *See Care Plan and progress notes for long and short-term goals.  Barriers to Discharge: needs arteriogram when INR permits    Possible Resolutions to Barriers:  see above    Discharge Planning/Teaching Needs:  Home with wife who has completed family education and ready for discharge tomorrow.      Team Discussion:  Pt reaching goals of mod/i level-medical issues-arteriogram on Friday.  Balance issues getting better  Revisions to Treatment Plan: Medical test now on Friday hopeful to discharge after test   Continued Need for Acute Rehabilitation Level of Care: The patient requires daily medical management by a physician with specialized training in physical medicine and rehabilitation for the following conditions: Daily direction of a multidisciplinary physical rehabilitation program to ensure safe treatment while  eliciting the highest outcome that is of practical value to the patient.: Yes Daily medical management of patient stability for increased activity during participation in an intensive rehabilitation regime.: Yes Daily analysis of laboratory values and/or radiology reports with any subsequent need for medication adjustment of medical intervention  for : Neurological problems  Paul [name] 06/10/2013, 8:42 AM

## 2013-06-11 ENCOUNTER — Encounter (HOSPITAL_COMMUNITY): Payer: Self-pay | Admitting: Pharmacy Technician

## 2013-06-11 ENCOUNTER — Other Ambulatory Visit: Payer: Self-pay

## 2013-06-11 DIAGNOSIS — I951 Orthostatic hypotension: Secondary | ICD-10-CM

## 2013-06-11 DIAGNOSIS — R5381 Other malaise: Secondary | ICD-10-CM

## 2013-06-11 DIAGNOSIS — N186 End stage renal disease: Secondary | ICD-10-CM

## 2013-06-11 DIAGNOSIS — R21 Rash and other nonspecific skin eruption: Secondary | ICD-10-CM

## 2013-06-11 LAB — CBC
HEMATOCRIT: 26.7 % — AB (ref 39.0–52.0)
HEMOGLOBIN: 8.2 g/dL — AB (ref 13.0–17.0)
MCH: 29.9 pg (ref 26.0–34.0)
MCHC: 30.7 g/dL (ref 30.0–36.0)
MCV: 97.4 fL (ref 78.0–100.0)
Platelets: 278 10*3/uL (ref 150–400)
RBC: 2.74 MIL/uL — ABNORMAL LOW (ref 4.22–5.81)
RDW: 15.3 % (ref 11.5–15.5)
WBC: 9.6 10*3/uL (ref 4.0–10.5)

## 2013-06-11 LAB — GLUCOSE, CAPILLARY
Glucose-Capillary: 77 mg/dL (ref 70–99)
Glucose-Capillary: 212 mg/dL — ABNORMAL HIGH (ref 70–99)

## 2013-06-11 LAB — PROTIME-INR
INR: 1.61 — ABNORMAL HIGH (ref 0.00–1.49)
PROTHROMBIN TIME: 18.7 s — AB (ref 11.6–15.2)

## 2013-06-11 MED ORDER — ASPIRIN EC 81 MG PO TBEC: 81.0000 mg | DELAYED_RELEASE_TABLET | Freq: Every day | ORAL | Status: DC

## 2013-06-11 MED ORDER — HYDROCODONE-ACETAMINOPHEN 5-325 MG PO TABS: 1.0000 | ORAL_TABLET | ORAL | Status: DC | PRN

## 2013-06-11 MED ORDER — INSULIN GLARGINE 100 UNIT/ML ~~LOC~~ SOLN: SUBCUTANEOUS | Status: DC

## 2013-06-11 MED ORDER — MIDODRINE HCL 10 MG PO TABS: 10.0000 mg | ORAL_TABLET | Freq: Three times a day (TID) | ORAL | Status: DC

## 2013-06-11 MED ORDER — ENOXAPARIN SODIUM 100 MG/ML ~~LOC~~ SOLN: 1.0000 mg/kg | SUBCUTANEOUS | Status: DC

## 2013-06-11 MED ORDER — RENA-VITE PO TABS: 1.0000 | ORAL_TABLET | Freq: Every day | ORAL | Status: DC

## 2013-06-11 MED ORDER — GABAPENTIN 300 MG PO CAPS: 300.0000 mg | ORAL_CAPSULE | Freq: Every day | ORAL | Status: DC

## 2013-06-11 MED ORDER — ATORVASTATIN CALCIUM 10 MG PO TABS: 10.0000 mg | ORAL_TABLET | Freq: Every day | ORAL | Status: DC

## 2013-06-11 MED ORDER — SEVELAMER CARBONATE 800 MG PO TABS: 1600.0000 mg | ORAL_TABLET | Freq: Three times a day (TID) | ORAL | Status: DC

## 2013-06-11 MED ORDER — SERTRALINE HCL 100 MG PO TABS: 100.0000 mg | ORAL_TABLET | Freq: Every day | ORAL | Status: DC

## 2013-06-11 MED ORDER — CYCLOBENZAPRINE HCL 10 MG PO TABS: 10.0000 mg | ORAL_TABLET | Freq: Every day | ORAL | Status: DC

## 2013-06-11 MED ORDER — VITAMIN D 1000 UNITS PO TABS: 1000.0000 [IU] | ORAL_TABLET | Freq: Every day | ORAL | Status: DC

## 2013-06-11 MED ORDER — OMEPRAZOLE 40 MG PO CPDR: 40.0000 mg | DELAYED_RELEASE_CAPSULE | Freq: Every day | ORAL | Status: DC

## 2013-06-11 NOTE — Discharge Summary (Signed)
Discharge summary job # [id number]

## 2013-06-11 NOTE — Discharge Instructions (Signed)
Inpatient Rehab Discharge Instructions  Paul [name] Discharge date and time: No discharge date for patient encounter.   Activities/Precautions/ Functional Status: Activity: activity as tolerated Diet: renal diet Wound Care: none needed Functional status:  ___ No restrictions     ___ Walk up steps independently ___ 24/7 supervision/assistance   ___ Walk up steps with assistance ___ Intermittent supervision/assistance  ___ Bathe/dress independently ___ Walk with walker     ___ Bathe/dress with assistance ___ Walk Independently    ___ Shower independently _x__ Walk with assistance    ___ Shower with assistance ___ No alcohol     ___ Return to work/school ________  Special Instructions: Continue dialysis as advised per renal services  Contact Dr. Oneida Alar [phone] in regards to arranging arteriogram   COMMUNITY REFERRALS UPON DISCHARGE:     Outpatient: PT  Agency: [facility] [id number] Date of Last Service:06/10/2013  Appointment Date/Time:JANUARY 30-Monday 10;30-11;45 AM [place] [facility] HHRN-PT AND WIFE AWARE WILL CONTACT AT HOME TO BEGIN SAT 1/24 [place]  [id number]   Medical Equipment/Items Ordered:ROLLING [facility]  Agency/Supplier:[facility]   [id number] OTHER: [facility]  [id number] [name]     My questions have been answered and I understand these instructions. I will adhere to these goals and the provided educational materials after my discharge from the hospital.  Patient/Caregiver Signature _______________________________ Date __________  Clinician Signature _______________________________________ Date __________  Please bring this form and your medication list with you to all your follow-up doctor's appointments.

## 2013-06-11 NOTE — Progress Notes (Signed)
58 y.o. right-handed male with history of atrial fibrillation on chronic Coumadin , end-stage renal disease secondary to diabetes mellitus on peritoneal dialysis at home. Patient was using a cane and walker prior to admission. Admitted 05/24/2013 with progressive weakness over several weeks as well as episode of fall when trying to get bed. Patient denies any head trauma although he had been having generalized headache since the fall. Cranial CT scan negative for any acute abnormalities. CT cervical spine with no fracture or malalignment. Blood pressure on admission 90/60 he did receive fluid bolus. Creatinine on admission of 10.02 and improved to 6.27. Followup renal services with hemodialysis ongoing.   Subjective/Complaints: Appreciate Vascular surgery note, foot numbness with pain that increases slightly with walking, no clear cut claudication symptoms  Slept well last noc, pt tried cane rather than walker yesterday Review of Systems - Negative except weakness  Objective: Vital Signs: Blood pressure 121/68, pulse 63, temperature 97.9 F (36.6 C), temperature source Oral, resp. rate 18, height $RemoveBe'6\' 2"'[id number]$  (1.88 m), weight 86.3 kg (190 lb 4.1 oz), SpO2 97.00%. No results found. Results for orders placed during the hospital encounter of 05/31/13 (from the past 72 hour(s))  GLUCOSE, CAPILLARY     Status: Abnormal   Collection Time    06/08/13 11:10 AM      Result Value Range   Glucose-Capillary 177 (*) 70 - 99 mg/dL   Comment 1 Notify RN    CBC     Status: Abnormal   Collection Time    06/08/13  4:02 PM      Result Value Range   WBC 11.7 (*) 4.0 - 10.5 K/uL   RBC 2.81 (*) 4.22 - 5.81 MIL/uL   Hemoglobin 8.4 (*) 13.0 - 17.0 g/dL   HCT 26.2 (*) 39.0 - 52.0 %   MCV 93.2  78.0 - 100.0 fL   MCH 29.9  26.0 - 34.0 pg   MCHC 32.1  30.0 - 36.0 g/dL   RDW 14.7  11.5 - 15.5 %   Platelets 368  150 - 400 K/uL  RENAL FUNCTION PANEL     Status: Abnormal   Collection Time    06/08/13  4:02 PM   Result Value Range   Sodium 130 (*) 137 - 147 mEq/L   Potassium 5.0  3.7 - 5.3 mEq/L   Chloride 90 (*) 96 - 112 mEq/L   CO2 19  19 - 32 mEq/L   Glucose, Bld 151 (*) 70 - 99 mg/dL   BUN 93 (*) 6 - 23 mg/dL   Creatinine, Ser 8.42 (*) 0.50 - 1.35 mg/dL   Calcium 8.0 (*) 8.4 - 10.5 mg/dL   Phosphorus 3.8  2.3 - 4.6 mg/dL   Albumin 1.7 (*) 3.5 - 5.2 g/dL   GFR calc non Af Amer 6 (*) >90 mL/min   GFR calc Af Amer 7 (*) >90 mL/min   Comment: (NOTE)     The eGFR has been calculated using the CKD EPI equation.     This calculation has not been validated in all clinical situations.     eGFR's persistently <90 mL/min signify possible Chronic Kidney     Disease.  GLUCOSE, CAPILLARY     Status: Abnormal   Collection Time    06/08/13  8:35 PM      Result Value Range   Glucose-Capillary 124 (*) 70 - 99 mg/dL  GLUCOSE, CAPILLARY     Status: Abnormal   Collection Time    06/09/13  3:06 AM  Result Value Range   Glucose-Capillary 121 (*) 70 - 99 mg/dL  PROTIME-INR     Status: Abnormal   Collection Time    06/09/13  6:03 AM      Result Value Range   Prothrombin Time 23.6 (*) 11.6 - 15.2 seconds   INR 2.18 (*) 0.00 - 1.49  GLUCOSE, CAPILLARY     Status: None   Collection Time    06/09/13  7:14 AM      Result Value Range   Glucose-Capillary 89  70 - 99 mg/dL   Comment 1 Notify RN    GLUCOSE, CAPILLARY     Status: Abnormal   Collection Time    06/09/13 11:21 AM      Result Value Range   Glucose-Capillary 162 (*) 70 - 99 mg/dL   Comment 1 Notify RN    PARATHYROID HORMONE, INTACT (NO CA)     Status: Abnormal   Collection Time    06/09/13  1:09 PM      Result Value Range   PTH 149.5 (*) 14.0 - 72.0 pg/mL   Comment: Performed at [place], CAPILLARY     Status: Abnormal   Collection Time    06/09/13  5:22 PM      Result Value Range   Glucose-Capillary 129 (*) 70 - 99 mg/dL  GLUCOSE, CAPILLARY     Status: Abnormal   Collection Time    06/09/13  9:12 PM       Result Value Range   Glucose-Capillary 161 (*) 70 - 99 mg/dL  GLUCOSE, CAPILLARY     Status: Abnormal   Collection Time    06/10/13  5:18 AM      Result Value Range   Glucose-Capillary 67 (*) 70 - 99 mg/dL  GLUCOSE, CAPILLARY     Status: None   Collection Time    06/10/13  5:46 AM      Result Value Range   Glucose-Capillary 87  70 - 99 mg/dL  PROTIME-INR     Status: Abnormal   Collection Time    06/10/13  6:12 AM      Result Value Range   Prothrombin Time 20.4 (*) 11.6 - 15.2 seconds   INR 1.80 (*) 0.00 - 1.49  GLUCOSE, CAPILLARY     Status: None   Collection Time    06/10/13  7:21 AM      Result Value Range   Glucose-Capillary 75  70 - 99 mg/dL  GLUCOSE, CAPILLARY     Status: Abnormal   Collection Time    06/10/13 11:34 AM      Result Value Range   Glucose-Capillary 168 (*) 70 - 99 mg/dL   Comment 1 Notify RN    RENAL FUNCTION PANEL     Status: Abnormal   Collection Time    06/10/13  5:10 PM      Result Value Range   Sodium 136 (*) 137 - 147 mEq/L   Potassium 4.6  3.7 - 5.3 mEq/L   Chloride 95 (*) 96 - 112 mEq/L   CO2 23  19 - 32 mEq/L   Glucose, Bld 60 (*) 70 - 99 mg/dL   BUN 77 (*) 6 - 23 mg/dL   Creatinine, Ser 7.17 (*) 0.50 - 1.35 mg/dL   Calcium 7.9 (*) 8.4 - 10.5 mg/dL   Phosphorus 4.1  2.3 - 4.6 mg/dL   Albumin 1.6 (*) 3.5 - 5.2 g/dL   GFR calc non Af Amer 7 (*) >90  mL/min   GFR calc Af Amer 9 (*) >90 mL/min   Comment: (NOTE)     The eGFR has been calculated using the CKD EPI equation.     This calculation has not been validated in all clinical situations.     eGFR's persistently <90 mL/min signify possible Chronic Kidney     Disease.  CBC     Status: Abnormal   Collection Time    06/10/13  5:13 PM      Result Value Range   WBC 12.8 (*) 4.0 - 10.5 K/uL   RBC 2.71 (*) 4.22 - 5.81 MIL/uL   Hemoglobin 8.2 (*) 13.0 - 17.0 g/dL   HCT 25.8 (*) 39.0 - 52.0 %   MCV 95.2  78.0 - 100.0 fL   MCH 30.3  26.0 - 34.0 pg   MCHC 31.8  30.0 - 36.0 g/dL   RDW 15.0   11.5 - 15.5 %   Platelets 341  150 - 400 K/uL  GLUCOSE, CAPILLARY     Status: Abnormal   Collection Time    06/10/13  9:30 PM      Result Value Range   Glucose-Capillary 102 (*) 70 - 99 mg/dL  PROTIME-INR     Status: Abnormal   Collection Time    06/10/13 10:00 PM      Result Value Range   Prothrombin Time 18.7 (*) 11.6 - 15.2 seconds   INR 1.61 (*) 0.00 - 1.49  GLUCOSE, CAPILLARY     Status: None   Collection Time    06/11/13  5:03 AM      Result Value Range   Glucose-Capillary 77  70 - 99 mg/dL  PROTIME-INR     Status: Abnormal   Collection Time    06/11/13  5:35 AM      Result Value Range   Prothrombin Time 18.7 (*) 11.6 - 15.2 seconds   INR 1.61 (*) 0.00 - 1.49  CBC     Status: Abnormal   Collection Time    06/11/13  5:35 AM      Result Value Range   WBC 9.6  4.0 - 10.5 K/uL   RBC 2.74 (*) 4.22 - 5.81 MIL/uL   Hemoglobin 8.2 (*) 13.0 - 17.0 g/dL   HCT 26.7 (*) 39.0 - 52.0 %   MCV 97.4  78.0 - 100.0 fL   MCH 29.9  26.0 - 34.0 pg   MCHC 30.7  30.0 - 36.0 g/dL   RDW 15.3  11.5 - 15.5 %   Platelets 278  150 - 400 K/uL     HEENT: normal Cardio: RRR and no murmur Resp: CTA B/L and unlabored GI: BS positive and non tender,ND, Abd binder Extremity:  Pulses negative and No Edema, no tenderness over toes Skin:  Erythema at ankles with dry necrotic area no drainage,non tender Neuro: Alert/Oriented, Cranial Nerve II-XII normal, Abnormal Sensory reduced sensory R great toe, left little toe and Abnormal Motor 4/5 Bilateral Deltoidf , bi,tri, grip, HF, KE, ADF, 3-EHL Musc/Skel:  Normal Gen NAD   Assessment/Plan: 1. Functional deficits secondary to Deconditioning which require 3+ hours per day of interdisciplinary therapy in a comprehensive inpatient rehab setting. D/C today See d/c summary Nephro f/u Arteriogram as outpt Punch biopsy results wil be followed up by nephro  FIM: FIM - Bathing Bathing Steps Patient Completed: Chest;Front perineal area;Right lower leg  (including foot);Left lower leg (including foot);Buttocks;Right Arm;Left Arm;Right upper leg;Left upper leg;Abdomen Bathing: 6: More than reasonable amount of time  FIM - Upper Body Dressing/Undressing Upper body dressing/undressing steps patient completed: Thread/unthread right sleeve of pullover shirt/dresss;Thread/unthread left sleeve of pullover shirt/dress;Put head through opening of pull over shirt/dress;Pull shirt over trunk Upper body dressing/undressing: 7: Complete Independence: No helper FIM - Lower Body Dressing/Undressing Lower body dressing/undressing steps patient completed: Thread/unthread right underwear leg;Thread/unthread left underwear leg;Pull underwear up/down;Thread/unthread right pants leg;Pull pants up/down;Thread/unthread left pants leg;Don/Doff left sock;Don/Doff right sock Lower body dressing/undressing: 6: More than reasonable amount of time  FIM - Toileting Toileting steps completed by patient: Adjust clothing prior to toileting;Adjust clothing after toileting Toileting Assistive Devices: Grab bar or rail for support Toileting: 6: Assistive device: No helper  FIM - Radio producer Devices: Insurance account manager Transfers: 6-Assistive device: No helper  FIM - Control and instrumentation engineer Devices: [employer];Arm rests Bed/Chair Transfer: 4: Bed > Chair or W/C: Min A (steadying Pt. > 75%);4: Chair or W/C > Bed: Min A (steadying Pt. > 75%)  FIM - Locomotion: Wheelchair Distance: 200 Locomotion: Wheelchair: 6: Travels 150 ft or more, turns around, maneuvers to table, bed or toilet, negotiates 3% grade: maneuvers on rugs and over door sills independently FIM - Locomotion: Ambulation Locomotion: Ambulation Assistive Devices: Administrator Ambulation/Gait Assistance: 6: Modified independent (Device/Increase time) Locomotion: Ambulation: 6: Travels 150 ft or more with assistive device/no helper  Comprehension Comprehension Mode:  Auditory Comprehension: 7-Follows complex conversation/direction: With no assist  Expression Expression Mode: Verbal Expression: 7-Expresses complex ideas: With no assist  Social Interaction Social Interaction: 6-Interacts appropriately with others with medication or extra time (anti-anxiety, antidepressant).  Problem Solving Problem Solving: 7-Solves complex problems: Recognizes & self-corrects  Memory Memory: 7-Complete Independence: No helper  Medical Problem List and Plan:  1. Multifactorial gait disorder related to diabetes mellitus peripheral neuropathy, osteoarthritis, orthostasis and multi-medical  2. DVT Prophylaxis/Anticoagulation: Chronic Coumadin therapy for atrial fibrillation. Monitor for any bleeding episodes. Latest INR 1.8, will check with cardiology whether the patient can be taken of warfarin for several days without a heparin bridge until arteriogram is completed. 3. Pain Management: Flexeril 10 mg each bedtime, Resume Neurontin bit instead of 100 mg 3 times a day,try $RemoveB'300mg'REapnYfo$  qhs hydrocodone as needed. Monitor with increased mobility  4. Mood/depression. Zoloft 100 mg daily. Provide emotional support  5. Neuropsych: This patient is capable of making decisions on his own behalf.  6. End-stage renal disease. Continue hemodialysis as per renal services. Follow closely for activity tolerance as it pertains to volume status, effects of HD, etc  7. Diabetes mellitus with peripheral neuropathy.uncontrolled Hemoglobin A1c 6.7. NovoLog 4 units 3 times a day,reduce Lantus insulin 1o units each bedtime and 5Units qam. Check CBGs a.c. and at bedtime  8. Dizziness/Orthostasis. Midodrine 10 mg 3 times a day.last ortho vitals looked OK even though pt symptomatic, gabapentin-encourage adequate fluid intake  -TEDS extend to thigh hi,D/c abd binder  9. Hyperlipidemia. Lipitor  10. Chronic anemia. Aranesp. Followup CBC with dialysis  11. GERD. Protonix  12.  Skin area bilateral distal  shin, sparing feet ?Vasculitis, warfarin induced necrosis vs lg vessel arterial issue  ABIs inconclusive, VVS wants arteriogram.  LOS (Days) 11 A FACE TO FACE EVALUATION WAS PERFORMED  KIRSTEINS,ANDREW E 06/11/2013, 7:54 AM

## 2013-06-11 NOTE — Progress Notes (Signed)
  Subjective: No bleeding at site, no pain.    Objective: Vital signs in last 24 hours: Temp:  [97.9 F (36.6 C)-98.9 F (37.2 C)] 97.9 F (36.6 C) (01/23 0500) Pulse Rate:  [59-69] 63 (01/23 0500) Resp:  [16-18] 18 (01/23 0500) BP: (110-136)/(60-71) 121/68 mmHg (01/23 0500) SpO2:  [95 %-97 %] 97 % (01/23 0500) Weight:  [184 lb 15.5 oz (83.9 kg)-192 lb 0.3 oz (87.1 kg)] 190 lb 4.1 oz (86.3 kg) (01/22 2134) Last BM Date: 06/10/13  Intake/Output from previous day: 01/22 0701 - 01/23 0700 In: 480 [P.O.:480] Out: 2963  Intake/Output this shift: Total I/O In: 240 [P.O.:240] Out: -   PE Wound: Left ankle biopsy without bleeding or erythema.    Lab Results:   Recent Labs  06/10/13 1713 06/11/13 0535  WBC 12.8* 9.6  HGB 8.2* 8.2*  HCT 25.8* 26.7*  PLT 341 278   BMET  Recent Labs  06/08/13 1602 06/10/13 1710  NA 130* 136*  K 5.0 4.6  CL 90* 95*  CO2 19 23  GLUCOSE 151* 60*  BUN 93* 77*  CREATININE 8.42* 7.17*  CALCIUM 8.0* 7.9*   PT/INR  Recent Labs  06/10/13 2200 06/11/13 0535  LABPROT 18.7* 18.7*  INR 1.61* 1.61*   ABG No results found for this basename: PHART, PCO2, PO2, HCO3,  in the last 72 hours  Studies/Results: No results found.  Anti-infectives: Anti-infectives   None      Assessment/Plan: S/p punch biopsy to LLE wound -apply dry dressing daily. -spoke with pathology, may be resulted today, but likely on Monday. -Surgery signing off.  Please do not hesitate to contact [facility] with questions or concerns.  LOS: 11 days    Oneida [facility] ANP-BC [id number] 06/11/2013 11:29 AM

## 2013-06-11 NOTE — Progress Notes (Signed)
Pt. Got discharge instructions,follow up appointments and prescriptions.Pt. Ready to go home with his wife.RN teach pt's wife about Lovenox injections and wound care and dressing change.Pt. And his wife verbalized their understanding.Their questions were answer by RN.

## 2013-06-11 NOTE — Progress Notes (Signed)
Social Work Discharge Note Discharge Note  The overall goal for the admission was met for:   Discharge location: Yes-HOME WITH WIFE WHO WILL BE THERE WITH HIM  Length of Stay: Yes-11 DAYS  Discharge activity level: Yes-MOD/I LEVEL  Home/community participation: Yes  Services provided included: MD, RD, PT, OT, RN, Pharmacy, Neuropsych and SW  Financial Services: Private Insurance: [employer]  Follow-up services arranged: [facility]  1/30 10;30-11;45  [facility] DELIVERED TO ROOM-INSURANCE PREF FOR COMPANY  Comments (or additional information):FAMILY EDUCATION COMPLETED WITH WIFE, TAUGHT LOVENOX INJECTIONS ALONG WITH PT. WILL HAVE HHRN TO REINFORCE TEACHING UNTIL TEST ON Monday.  [phone]-[phone]  Patient/Family verbalized understanding of follow-up arrangements: Yes  Individual responsible for coordination of the follow-up plan: PATIENT & [name]  Confirmed correct DME delivered: Paul [name], Paul [name] 06/11/2013    Paul [name], Paul [name] 

## 2013-06-12 NOTE — Discharge Summary (Signed)
NAME:  Paul [name], Paul [name] NO.:  [id number]  MEDICAL RECORD NO.:  [id number]  LOCATION:  4M01C                        FACILITY:  [facility]  PHYSICIAN:  Paul [name], P.A.  DATE OF BIRTH:  [birth year]  DATE OF ADMISSION:  05/31/2013 DATE OF DISCHARGE:  06/11/2013                              DISCHARGE SUMMARY   DISCHARGE DIAGNOSES: 1. Multifactorial gait disorder related to diabetes mellitus with     peripheral neuropathy -- multi-medical. 2. Chronic Coumadin therapy for atrial fibrillation, discontinued     secondary to Coumadin necrosis. 3. Pain management. 4. Depression. 5. End-stage renal disease, on hemodialysis. 6. Calciphylaxis versus Coumadin necrosis. 7. Diabetes mellitus, peripheral neuropathy. 8. Orthostatic hypotension. 9. Hyperlipidemia. 10.Chronic anemia. 11.Gastroesophageal reflux disease.  HISTORY OF PRESENT ILLNESS:  This is a 58 year old right-handed male with history of atrial fibrillation, on chronic Coumadin therapy, end- stage renal disease, and diabetes mellitus, who was using a cane and walker prior to admission.  He was admitted on May 24, 2013, with progressive weakness over several weeks as well as episodes of falls when trying to get out of bed.  Patient denies any head trauma.  Cranial CT scan negative for any acute abnormalities.  CT of cervical spine, negative.  Blood pressure on admission 90/60.  He did receive a fluid bolus.  Creatinine on admission 10.02, improved to 6.27 and follow up Renal Services.  Bouts of hypokalemia with supplement added.  Physical and occupational therapy ongoing.  The patient was admitted for comprehensive rehab program.  PAST MEDICAL HISTORY:  See discharge diagnoses.  SOCIAL HISTORY:  Lives with spouse.  FUNCTIONAL HISTORY:  Prior to admission, used cane and walker. Functional status upon admission to rehab service, min to moderate assist.  PHYSICAL EXAMINATION:  VITAL SIGNS:  Blood pressure  121/65, pulse 65, temperature 98.4, respirations 18. GENERAL:  This was an alert male, flat affect, oriented x3.  Pupils were round and reactive to light. LUNGS:  Clear to auscultation. CARDIAC:  Regular rate and rhythm. ABDOMEN:  Soft, nontender.  Good bowel sounds.  Ischemic changes, lower extremities.  REHABILITATION HOSPITAL COURSE:  Patient was admitted to inpatient rehab services with therapies initiated on a 3-hour daily basis consisting of physical therapy, occupational therapy, and rehabilitation nursing.  The following issues were addressed during the patient's rehabilitation stay.  Pertaining to Paul [name] multifactorial gait disorder related suspect diabetes mellitus peripheral neuropathy, continued to attend therapies and progressed nicely.  The patient was on Coumadin therapy for atrial fibrillation, cardiac rate controlled.  Patient developed increasing bilateral lower extremities wounds.  ABIs were completed. They were indecisive.  Noted calcified vessels.  Vascular Surgery consulted in related to ischemic changes, possible peripheral vascular disease.  There was some suspect of possible calciphylaxis versus Coumadin necrosis which was addressed per Renal Services.  Plan was for arteriogram to be completed per Vascular Surgery, Dr. Oneida [name].  Consult obtained.  Underwent left ankle punch biopsy x2.  Coumadin has been discontinued, placed on Lovenox therapy which would be addressed at discharge and question plan to resume Coumadin at that time.  [facility] hemod[facility] center had been contacted for ongoing dialysis.  [facility] was made aware of  this.  Blood sugars overall remained well controlled.  He did continue on midodrine for close monitoring of orthostatic blood pressure changes.  The patient received weekly collaborative interdisciplinary team conferences to discuss estimated length of stay, family teaching any barriers to his discharge.  He was ambulating 180  feet in a controlled home community environment with a rolling walker, modified independent in his room, bed to wheelchair to couch, modified independence.  Propelling his wheelchair independently. Arrangements were made for discharge to home.  DISCHARGE MEDICATIONS: 1. Aspirin 81 mg p.o. daily. 2. Lipitor 10 mg p.o. daily. 3. Flexeril 10 mg p.o. at bedtime. 4. Lovenox 85 mg subcutaneously daily. 5. Neurontin 300 mg p.o. at bedtime. 6. Hydrocodone 1 or 2 tablets every 4 hours as needed pain, dispense     of 60 tablets. 7. Lantus insulin 10 units at bedtime 5 units daily. 8. Midodrine 10 mg p.o. t.i.d. 9. Multivitamin 1 tablet daily. 10.Protonix 40 mg p.o. daily. 11.Zoloft 100 mg p.o. daily. 12.Renvela 1600 mg p.o. t.i.d.  DIET:  Renal diet.  SPECIAL INSTRUCTIONS:  Contacts should be made to Dr. Ruta [name] of Vascular Surgery in relation to ranging arteriogram for Monday, June 14, 2013.  Dialysis had been arranged at the [facility] for Saturday, June 12, 2013, at 11:45.  Coumadin remained on hold.  Continue Lovenox therapy until further addressed with arteriogram.  Ongoing therapies had been arranged at [facility]patient [facility].     Paul [name], P.A.     DA/MEDQ  D:  06/11/2013  T:  06/11/2013  Job:  [id number]  cc:   Paul Amel, MD Paul [name]. Paul Alar, MD

## 2013-06-14 ENCOUNTER — Ambulatory Visit (HOSPITAL_COMMUNITY)
Admission: RE | Admit: 2013-06-14 | Discharge: 2013-06-14 | Disposition: A | Discharge status: Home / Self Care | Payer: Managed Care, Other (non HMO) | Source: Ambulatory Visit | Attending: Vascular Surgery | Admitting: Vascular Surgery

## 2013-06-14 ENCOUNTER — Encounter (HOSPITAL_COMMUNITY)
Admission: RE | Disposition: A | Discharge status: Home / Self Care | Payer: Self-pay | Source: Ambulatory Visit | Attending: Vascular Surgery

## 2013-06-14 ENCOUNTER — Ambulatory Visit: Payer: Managed Care, Other (non HMO) | Admitting: Physical Therapy

## 2013-06-14 DIAGNOSIS — L98499 Non-pressure chronic ulcer of skin of other sites with unspecified severity: Principal | ICD-10-CM | POA: Insufficient documentation

## 2013-06-14 DIAGNOSIS — I739 Peripheral vascular disease, unspecified: Secondary | ICD-10-CM | POA: Insufficient documentation

## 2013-06-14 DIAGNOSIS — L97909 Non-pressure chronic ulcer of unspecified part of unspecified lower leg with unspecified severity: Secondary | ICD-10-CM | POA: Insufficient documentation

## 2013-06-14 HISTORY — PX: LOWER EXTREMITY ANGIOGRAM: SHX5508

## 2013-06-14 LAB — POCT I-STAT, CHEM 8
BUN: 61 mg/dL — ABNORMAL HIGH (ref 6–23)
Calcium, Ion: 1.12 mmol/L (ref 1.12–1.23)
Chloride: 98 mEq/L (ref 96–112)
Creatinine, Ser: 6.2 mg/dL — ABNORMAL HIGH (ref 0.50–1.35)
Glucose, Bld: 102 mg/dL — ABNORMAL HIGH (ref 70–99)
HCT: 32 % — ABNORMAL LOW (ref 39.0–52.0)
HEMOGLOBIN: 10.9 g/dL — AB (ref 13.0–17.0)
Potassium: 3.9 mEq/L (ref 3.7–5.3)
SODIUM: 140 meq/L (ref 137–147)
TCO2: 31 mmol/L (ref 0–100)

## 2013-06-14 LAB — PROTIME-INR
INR: 1.25 (ref 0.00–1.49)
PROTHROMBIN TIME: 15.4 s — AB (ref 11.6–15.2)

## 2013-06-14 LAB — GLUCOSE, CAPILLARY: Glucose-Capillary: 127 mg/dL — ABNORMAL HIGH (ref 70–99)

## 2013-06-14 SURGERY — ANGIOGRAM, LOWER EXTREMITY
Anesthesia: LOCAL

## 2013-06-14 MED ORDER — MIDAZOLAM HCL 2 MG/2ML IJ SOLN
INTRAMUSCULAR | Status: AC
Start: 2013-06-14 — End: 2013-06-14
  Filled 2013-06-14: qty 2

## 2013-06-14 MED ORDER — SODIUM CHLORIDE 0.9 % IJ SOLN: 3.0000 mL | Freq: Two times a day (BID) | INTRAMUSCULAR | Status: DC

## 2013-06-14 MED ORDER — ONDANSETRON HCL 4 MG/2ML IJ SOLN: 4.0000 mg | Freq: Four times a day (QID) | INTRAMUSCULAR | Status: DC | PRN

## 2013-06-14 MED ORDER — FENTANYL CITRATE 0.05 MG/ML IJ SOLN
INTRAMUSCULAR | Status: AC
Start: 2013-06-14 — End: 2013-06-14
  Filled 2013-06-14: qty 2

## 2013-06-14 MED ORDER — SODIUM CHLORIDE 0.9 % IJ SOLN: 3.0000 mL | INTRAMUSCULAR | Status: DC | PRN

## 2013-06-14 MED ORDER — ACETAMINOPHEN 325 MG PO TABS: 650.0000 mg | ORAL_TABLET | ORAL | Status: DC | PRN

## 2013-06-14 MED ORDER — SODIUM CHLORIDE 0.9 % IJ SOLN
3.0000 mL | INTRAMUSCULAR | Status: DC | PRN
Start: 2013-06-14 — End: 2013-06-14

## 2013-06-14 MED ORDER — HEPARIN (PORCINE) IN NACL 2-0.9 UNIT/ML-% IJ SOLN
INTRAMUSCULAR | Status: AC
  Filled 2013-06-14: qty 1000

## 2013-06-14 MED ORDER — HYDROCODONE-ACETAMINOPHEN 5-325 MG PO TABS
1.0000 | ORAL_TABLET | ORAL | Status: DC | PRN
  Administered 2013-06-14: 2 via ORAL
  Filled 2013-06-14: qty 2

## 2013-06-14 MED ORDER — LIDOCAINE HCL (PF) 1 % IJ SOLN
INTRAMUSCULAR | Status: AC
  Filled 2013-06-14: qty 30

## 2013-06-14 MED ORDER — COLLAGENASE 250 UNIT/GM EX OINT
TOPICAL_OINTMENT | Freq: Every day | CUTANEOUS | Status: DC
  Filled 2013-06-14: qty 30

## 2013-06-14 MED ORDER — SODIUM CHLORIDE 0.9 % IV SOLN: 250.0000 mL | INTRAVENOUS | Status: DC | PRN

## 2013-06-14 NOTE — Progress Notes (Signed)
Spoke to Dr Scot Dock regarding when to resume Coumadin and Lovenox.  Dr Scot Dock states to continue Lovenox for 4 more days, also resume Coumadin this evening and follow up with the people who follow is Coumadin

## 2013-06-14 NOTE — Interval H&P Note (Signed)
History and Physical Interval Note:  06/14/2013 7:36 AM  Paul [name]  has presented today for surgery, with the diagnosis of pvd with ulcer  The various methods of treatment have been discussed with the patient and family. After consideration of risks, benefits and other options for treatment, the patient has consented to  Procedure(s): LOWER EXTREMITY ANGIOGRAM (N/A) as a surgical intervention .  The patient's history has been reviewed, patient examined, no change in status, stable for surgery.  I have reviewed the patient's chart and labs.  Questions were answered to the patient's satisfaction.     Laia Wiley S

## 2013-06-14 NOTE — Discharge Instructions (Signed)
Angiography, Care After °Refer to this sheet in the next few weeks. These instructions provide you with information on caring for yourself after your procedure. Your health care provider may also give you more specific instructions. Your treatment has been planned according to current medical practices, but problems sometimes occur. Call your health care provider if you have any problems or questions after your procedure.  °WHAT TO EXPECT AFTER THE PROCEDURE °After your procedure, it is typical to have the following sensations: °· Minor discomfort or tenderness and a small bump at the catheter insertion site. The bump should usually decrease in size and tenderness within 1 to 2 weeks. °· Any bruising will usually fade within 2 to 4 weeks. °HOME CARE INSTRUCTIONS  °· You may need to keep taking blood thinners if they were prescribed for you. Only take over-the-counter or prescription medicines for pain, fever, or discomfort as directed by your health care provider. °· Do not apply powder or lotion to the site. °· Do not sit in a bathtub, swimming pool, or whirlpool for 5 to 7 days. °· You may shower 24 hours after the procedure. Remove the bandage (dressing) and gently wash the site with plain soap and water. Gently pat the site dry. °· Inspect the site at least twice daily. °· Limit your activity for the first 48 hours. Do not bend, squat, or lift anything over 20 lb (9 kg) or as directed by your health care provider. °· Do not drive home if you are discharged the day of the procedure. Have someone else drive you. Follow instructions about when you can drive or return to work. °SEEK MEDICAL CARE IF: °· You get lightheaded when standing up. °· You have drainage (other than a small amount of blood on the dressing). °· You have chills. °· You have a fever. °· You have redness, warmth, swelling, or pain at the insertion site. °SEEK IMMEDIATE MEDICAL CARE IF:  °· You develop chest pain or shortness of breath, feel faint,  or pass out. °· You have bleeding, swelling larger than a walnut, or drainage from the catheter insertion site. °· You develop pain, discoloration, coldness, or severe bruising in the leg or arm that held the catheter. °· You develop bleeding from any other place, such as the bowels. You may see bright red blood in your urine or stools, or your stools may appear black and tarry. °· You have heavy bleeding from the site. If this happens, hold pressure on the site. °MAKE SURE YOU: °· Understand these instructions. °· Will watch your condition. °· Will get help right away if you are not doing well or get worse. °Document Released: 11/22/2004 Document Revised: 01/06/2013 Document Reviewed: 09/28/2012 °ExitCare® Patient Information ©2014 [employer]. ° °

## 2013-06-14 NOTE — H&P (View-Only) (Signed)
Pt scheduled for arteriogram by my partner Dr Scot Dock on Monday 1/26.  Please continue to hold coumadin  Ruta Hinds, MD [facility] Office: [phone] Pager: [phone]

## 2013-06-14 NOTE — Consult Note (Signed)
WOC wound consult note Reconsulted for wound care evaluation of BLE wounds.  I had previously seen this patient in the inpatient rehab about one week ago.  Discussed today's arteriogram results with Dr. Scot Dock prior to my visit.  Per his request will reevaluate his wounds today for updated wound care. Dr. Scot Dock does feel he has adequate circulation to heal the LE and it will be ok to proceed with enzymatic debridement ointment in preparation for follow up with Dr. Oneida Alar in a few weeks.  I have explained the same to the patient and his wife.  Wound type: ulceration associated with PAD  2 dry necrotic ulceration of the RLE, pretibial and medial 3 dry necrotic ulcerations of the LLE, distal pretibial, lateral malleolus, and lateral foot.  Maroon, darkened area of the left great toe, plantar surface, but not surface necrosis.   Drainage (amount, consistency, odor) minimal Periwound: erythema circumferentially at the ulceration sites Dressing procedure/placement/frequency: Add enzymatic debridement ointment, cover with dry dressing, wrap with kerlix.  Bedside nurse to apply ointment prior to discharge and teach wife to perform wound care.    Discussed POC with patient and bedside nurse.  Re consult if needed, will not follow at this time. Thanks  Sugey Trevathan Kellogg, [place] [phone])

## 2013-06-14 NOTE — Op Note (Signed)
   PATIENT: Paul [name]   MRN: [id number] DOB: 1956/02/03    DATE OF PROCEDURE: 06/14/2013  INDICATIONS: Paul [name] is a 58 y.o. male who presents with wounds on both feet and evidence of tibial artery occlusive disease on exam.  PROCEDURE:  1. Ultrasound-guided access the right common femoral artery 2. Aortogram with bilateral iliac arteriogram and bilateral lower extremity runoff 3. Selective catheterization of the left external iliac artery with spot film of the left foot 4. Retrograde right femoral arteriogram with spot film the right foot  SURGEON: Judeth Cornfield. Scot Dock, MD, FACS  ANESTHESIA: local with sedation   EBL: minimal  TECHNIQUE: The patient was brought to the peripheral vascular lab and received 1 mg of Versed and 50 mcg of fentanyl. Both groins were prepped and draped in the usual sterile fashion. Under ultrasound guidance, after the skin was anesthetized, the right common femoral artery was cannulated and a guidewire introduced into the infrarenal aorta under fluoroscopic control. A 5 French sheath was introduced over the wire. A pigtail catheter was positioned at the L1 vertebral body a flush aortogram obtained. The catheter was in position above the aortic bifurcation and an oblique iliac projection was obtained. Bilateral lower extremity runoff films were obtained. In order to allow better visualization of the distal circulation, the pigtail catheter was exchanged for a crossover catheter which was positioned into the left common iliac artery. An angled Glidewire was advanced into the external iliac artery and the crossover catheter exchanged for an endhole catheter. Selective left external iliac arteriogram was obtained. The endhole catheter was removed in a retrograde right femoral arteriogram was obtained with a spot film of the right foot.  FINDINGS:  1. There is a single renal artery on the right and 2 renal arteries on the left with no renal artery stenosis  identified. 2. The infrarenal aorta, bilateral common iliac arteries, bilateral external iliac arteries are widely patent. 3. Bilateral common femoral, deep femoral, superficial femoral, popliteal, anterior tibial, posterior tibial, and peroneal arteries are patent. 4. On the right side, the anterior tibial artery is occluded at the ankle. There is moderate diffuse disease in the distal right posterior tibial artery was occluded at the ankle. 5. On the left side, the anterior tibial artery and posterior tibial arteries are occluded at the ankle.  [name], MD, FACS [facility]  DATE OF DICTATION:   06/14/2013

## 2013-06-15 ENCOUNTER — Other Ambulatory Visit: Payer: Self-pay | Admitting: Cardiology

## 2013-06-15 LAB — GLUCOSE, CAPILLARY: Glucose-Capillary: 115 mg/dL — ABNORMAL HIGH (ref 70–99)

## 2013-06-16 ENCOUNTER — Encounter: Payer: Self-pay | Admitting: Vascular Surgery

## 2013-06-16 ENCOUNTER — Telehealth: Payer: Self-pay | Admitting: Vascular Surgery

## 2013-06-16 NOTE — Telephone Encounter (Addendum)
Message copied by Gena Fray on Wed Jun 16, 2013 10:17 AM ------      Message from: Mena Goes      Created: Tue Jun 15, 2013 11:51 AM      Regarding: Schedule                   ----- Message -----         From: Elam Dutch, MD         Sent: 06/15/2013  11:16 AM           To: Mena Goes, CMA            Follow up this week to discuss results of agram            Juanda Crumble      ----- Message -----         From: Mena Goes, CMA         Sent: 06/09/2013   9:53 AM           To: Elam Dutch, MD            Dr. Scot Dock is going to do arteriogram on Monday 06-14-13.        ------  06/16/13: spoke with patient to schedule this appointment for 01/29 @ 830am, dpm

## 2013-06-17 ENCOUNTER — Encounter: Payer: Self-pay | Admitting: Vascular Surgery

## 2013-06-17 ENCOUNTER — Telehealth: Payer: Self-pay | Admitting: Pharmacist

## 2013-06-17 ENCOUNTER — Ambulatory Visit (INDEPENDENT_AMBULATORY_CARE_PROVIDER_SITE_OTHER): Payer: Managed Care, Other (non HMO) | Admitting: Vascular Surgery

## 2013-06-17 VITALS — BP 104/69 | HR 84 | Ht 74.0 in | Wt 189.0 lb

## 2013-06-17 DIAGNOSIS — I7025 Atherosclerosis of native arteries of other extremities with ulceration: Secondary | ICD-10-CM | POA: Insufficient documentation

## 2013-06-17 DIAGNOSIS — I739 Peripheral vascular disease, unspecified: Secondary | ICD-10-CM

## 2013-06-17 DIAGNOSIS — L98499 Non-pressure chronic ulcer of skin of other sites with unspecified severity: Principal | ICD-10-CM

## 2013-06-17 NOTE — Progress Notes (Signed)
Patient is a 58 year old male who returns for followup today. He has bilateral chronic wounds on the medial aspect of his tibial area. These were apparently biopsied recently and returned as calciphylaxis. I reviewed his arteriogram from earlier this week today. This shows patent vessels all the way to the level of the ankle with obliteration of the tibial vessels in the foot. The patient complains of chronic pain in both feet.  Physical exam:  Filed Vitals:   06/17/13 0843  BP: 104/69  Pulse: 84  Height: 6\' 2"  (1.88 m)  Weight: 189 lb (85.73 kg)  SpO2: 100%    Right and left lower extremity with dark dry eschar 5 x 7 cm medial aspect of the leg just above the ankle bilaterally scattered small ulcerations surrounding this.  Assessment: Nonhealing wounds bilateral lower extremities biopsy-proven calciphylaxis ulcers. Discussed with the patient today the poor prognosis with wound healing and calciphylaxis ulcers. 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  Plan: However the patient today continue local wound care versus amputation for improvement of pain control. He wishes to discuss the situation further with his nephrologist. He will followup with [place] on as-needed basis if he wishes an amputation.  Ruta Hinds, MD [facility] [place] Office: [phone] Pager: [phone]

## 2013-06-17 NOTE — Telephone Encounter (Signed)
[name] at [facility] called to inform us that a patient of Dr. Doug Sou recently developed calciphylaxis / ulcers on his lower extremities during his most recent hospital stay.  The confirmed diagnosis of calciphylaxis was just made.  Patient was bridging back to coumadin following recent arteriogram earlier this week.  Based on current risk factors, he has a CHADS-VASC score of 1.  I consulted with Dr. Martinique about patient and decision was made to discontinue coumadin and have him change to aspirin 81 mg qd instead.  Nurse is there with patient and is going to inform him to d/c lovenox and coumadin.  Nephrology got patient set up for wound care today, and if amputation needed, he will follow up with Dr. Oneida Alar.  Patient will take aspirin 81 mg qd only.    To Dr. Martinique as [name].

## 2013-06-18 ENCOUNTER — Ambulatory Visit: Payer: Managed Care, Other (non HMO) | Attending: Physical Medicine & Rehabilitation | Admitting: Physical Therapy

## 2013-06-18 DIAGNOSIS — R5381 Other malaise: Secondary | ICD-10-CM | POA: Insufficient documentation

## 2013-06-18 DIAGNOSIS — M6281 Muscle weakness (generalized): Secondary | ICD-10-CM | POA: Insufficient documentation

## 2013-06-18 DIAGNOSIS — N186 End stage renal disease: Secondary | ICD-10-CM | POA: Insufficient documentation

## 2013-06-18 DIAGNOSIS — G589 Mononeuropathy, unspecified: Secondary | ICD-10-CM | POA: Insufficient documentation

## 2013-06-18 DIAGNOSIS — IMO0001 Reserved for inherently not codable concepts without codable children: Secondary | ICD-10-CM | POA: Insufficient documentation

## 2013-06-21 ENCOUNTER — Encounter (HOSPITAL_BASED_OUTPATIENT_CLINIC_OR_DEPARTMENT_OTHER): Payer: Managed Care, Other (non HMO) | Attending: General Surgery

## 2013-06-21 DIAGNOSIS — L97809 Non-pressure chronic ulcer of other part of unspecified lower leg with unspecified severity: Secondary | ICD-10-CM | POA: Insufficient documentation

## 2013-06-21 DIAGNOSIS — E1169 Type 2 diabetes mellitus with other specified complication: Secondary | ICD-10-CM | POA: Insufficient documentation

## 2013-06-21 DIAGNOSIS — Z992 Dependence on renal dialysis: Secondary | ICD-10-CM | POA: Insufficient documentation

## 2013-06-21 DIAGNOSIS — Z79899 Other long term (current) drug therapy: Secondary | ICD-10-CM | POA: Insufficient documentation

## 2013-06-21 DIAGNOSIS — Z7982 Long term (current) use of aspirin: Secondary | ICD-10-CM | POA: Insufficient documentation

## 2013-06-21 DIAGNOSIS — N19 Unspecified kidney failure: Secondary | ICD-10-CM | POA: Insufficient documentation

## 2013-06-21 DIAGNOSIS — L988 Other specified disorders of the skin and subcutaneous tissue: Secondary | ICD-10-CM | POA: Insufficient documentation

## 2013-06-21 DIAGNOSIS — Z794 Long term (current) use of insulin: Secondary | ICD-10-CM | POA: Insufficient documentation

## 2013-06-22 ENCOUNTER — Ambulatory Visit: Payer: Managed Care, Other (non HMO) | Attending: Physical Medicine & Rehabilitation | Admitting: Physical Therapy

## 2013-06-22 DIAGNOSIS — G589 Mononeuropathy, unspecified: Secondary | ICD-10-CM | POA: Insufficient documentation

## 2013-06-22 DIAGNOSIS — IMO0001 Reserved for inherently not codable concepts without codable children: Secondary | ICD-10-CM | POA: Insufficient documentation

## 2013-06-22 DIAGNOSIS — R5381 Other malaise: Secondary | ICD-10-CM | POA: Insufficient documentation

## 2013-06-22 DIAGNOSIS — M6281 Muscle weakness (generalized): Secondary | ICD-10-CM | POA: Insufficient documentation

## 2013-06-22 NOTE — Progress Notes (Signed)
[facility]  NAME:  Paul [name], Paul [name] NO.:  [id number]  MEDICAL RECORD NO.:  [id number]      DATE OF BIRTH:  [birth year]  PHYSICIAN:  Judene Companion, M.D.      VISIT DATE:  06/21/2013                                  OFFICE VISIT   This is a 58 year old gentleman who comes here with bilateral areas on both of his ankles and both of his great toes and several smaller areas on both of his legs that have frank gangrenous changes of the skin. They have been biopsied when he was in the hospital and has been called calciphylaxis.  This gentleman is a diabetic.  He is on dialysis and is on many medicines including Norco, Lantus insulin, Prilosec, Zoloft, Renvela, aspirin, Lipitor, Rena-Vite, vitamin D, and Flexeril.  He came here today, showing a blood pressure of 146/80, pulse 80, temperature 98.6.  He weighs 185 pounds, he is 6 feet 2 inches.  He has had multiple operations including a Whipple procedure in the past.  He has had AV fistulas in both of his arms and he has a Port-A-Cath.  I felt that these probably are calciphylaxis, but they are very painful. I debrided the gangrenous leather like skin down to the subcu on both of his great toes and both of his ankles.  These wounds are all about 5-cm long and 1 to 0.5 cm wide.  He has had a vascular workup and both of his anterior tibial arteries are occluded.  I do not feel or hear with the Doppler any dorsalis pedis pulse.  I can hear a week posterior tibial pulse.  They would like a vascular workup, and so I am having Dr. Gwenlyn Found look at the patient and the arteriogram that was done by Dr. Joylene Igo, and it was done on June 14, 2013, so the wife said she would like a different opinion, so I will ask Dr. Gwenlyn Found to review the arteriogram and go from there.  DIAGNOSES: 1. Diabetes. 2. Renal failure. 3. Calciphylaxis. 4. Multiple diabetic ulcers on both of his legs, [place]  3.     Judene Companion, M.D.     PP/MEDQ  D:  06/21/2013  T:  06/22/2013  Job:  [id number]

## 2013-06-24 ENCOUNTER — Ambulatory Visit: Payer: Managed Care, Other (non HMO) | Admitting: Physical Therapy

## 2013-06-25 ENCOUNTER — Ambulatory Visit (INDEPENDENT_AMBULATORY_CARE_PROVIDER_SITE_OTHER): Payer: Managed Care, Other (non HMO) | Admitting: Cardiovascular Disease

## 2013-06-25 ENCOUNTER — Encounter: Payer: Self-pay | Admitting: Cardiovascular Disease

## 2013-06-25 VITALS — BP 132/80 | HR 88 | Ht 74.0 in | Wt 181.0 lb

## 2013-06-25 DIAGNOSIS — I739 Peripheral vascular disease, unspecified: Secondary | ICD-10-CM

## 2013-06-25 DIAGNOSIS — L98499 Non-pressure chronic ulcer of skin of other sites with unspecified severity: Principal | ICD-10-CM

## 2013-06-25 NOTE — Assessment & Plan Note (Addendum)
The patient was referred to me that by Dr. Judene Companion at the wound care center. 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 He was referred for critical limb ischemia. He is an insulin-dependent diabetic on hemodialysis. He developed nonhealing ulcers on his feet this past January to be getting progressively worse. He underwent an angiography by Dr. Shanon Rosser is feeling vessels down to the level of the ankle which point his vessels appear to be significantly diseased. He was told that there were no percutaneous or surgical options in case his amputation. I reviewed his films and agree with that prognosis. I am going to refer him to Dr. Brunetta Jeans at [facility] for a third opinion.

## 2013-06-25 NOTE — Progress Notes (Signed)
06/25/2013 Paul [name]   July 12, 1955  [id number]  Primary Physician [name], MD Primary Cardiologist: Lorretta Harp MD Renae Gloss   HPI:  [name] unfortunate 58 year old appsulin-dependent diabetes last 63 yearsearing married Caucasian male father of 47, grandfather to 2 grandchildren referred by Dr. Judene Companion at the [facility]  for evaluation of critical limb ischemia. He has been an insulin-dependent diabetic for the last 12 years. He has been on hemodialysis for one year and peritoneal dialysis prior to that. His other problems include orthostatic hypotension and diabetic peripheral neuropathy. He was hospitalized in January for 17 days at that time after the compression stockings developed nonhealing ulcers. He underwent angiography by Dr. Gae Gallop revealing severe disease of his pedal vessels. He was told that there were no options other than amputation. He presents now for a second opinion.   Current Outpatient Prescriptions  Medication Sig Dispense Refill  . aspirin EC 81 MG tablet Take 1 tablet (81 mg total) by mouth daily.      [name] atorvastatin (LIPITOR) 10 MG tablet Take 1 tablet (10 mg total) by mouth daily.  30 tablet  1  . cholecalciferol (VITAMIN D) 1000 UNITS tablet Take 1 tablet (1,000 Units total) by mouth daily.  30 tablet  1  . cyclobenzaprine (FLEXERIL) 10 MG tablet Take 1 tablet (10 mg total) by mouth at bedtime.  30 tablet  0  . darbepoetin (ARANESP) 200 MCG/0.4ML SOLN injection Inject 200 mcg into the skin.      . feeding supplement, RESOURCE BREEZE, (RESOURCE BREEZE) LIQD Take 1 Container by mouth daily.      [name] gabapentin (NEURONTIN) 300 MG capsule Take 1 capsule (300 mg total) by mouth at bedtime.  30 capsule  1  . HYDROcodone-acetaminophen (NORCO/VICODIN) 5-325 MG per tablet Take 1-2 tablets by mouth every 4 (four) hours as needed for moderate pain.  60 tablet  0  . Insulin Glargine (LANTUS SOLOSTAR) 100 UNIT/ML  Solostar Pen Inject 5-10 Units into the skin 2 (two) times daily. Inject 5 units every morning and 10 units every night      . midodrine (PROAMATINE) 10 MG tablet Take 1 tablet (10 mg total) by mouth 3 (three) times daily with meals.  90 tablet  1  . multivitamin (RENA-VIT) TABS tablet Take 1 tablet by mouth at bedtime.  30 tablet  0  . omeprazole (PRILOSEC) 40 MG capsule Take 1 capsule (40 mg total) by mouth daily.  30 capsule  1  . sertraline (ZOLOFT) 100 MG tablet Take 1 tablet (100 mg total) by mouth daily.  30 tablet  1  . sevelamer carbonate (RENVELA) 800 MG tablet Take 2 tablets (1,600 mg total) by mouth 3 (three) times daily with meals.  90 tablet  1   No current facility-administered medications for this visit.    Allergies  Allergen Reactions  . Metformin And Related Other (See Comments)    Kidney problems    History   Social History  . Marital Status: Married    Spouse Name: N/A    Number of Children: N/A  . Years of Education: N/A   Occupational History  . Not on file.   Social History Main Topics  . Smoking status: Never Smoker   . Smokeless tobacco: Never Used  . Alcohol Use: No  . Drug Use: No  . Sexual Activity: Not on file   Other Topics Concern  . Not on file  Social History Narrative  . No narrative on file     Review of Systems: General: negative for chills, fever, night sweats or weight changes.  Cardiovascular: negative for chest pain, dyspnea on exertion, edema, orthopnea, palpitations, paroxysmal nocturnal dyspnea or shortness of breath Dermatological: negative for rash Respiratory: negative for cough or wheezing Urologic: negative for hematuria Abdominal: negative for nausea, vomiting, diarrhea, bright red blood per rectum, melena, or hematemesis Neurologic: negative for visual changes, syncope, or dizziness All other systems reviewed and are otherwise negative except as noted above.    Blood pressure 132/80, pulse 88, height 6\' 2"  (1.88  m), weight 181 lb (82.101 kg).  General appearance: alert and no distress Neck: no adenopathy, no carotid bruit, no JVD, supple, symmetrical, trachea midline and thyroid not enlarged, symmetric, no tenderness/mass/nodules Lungs: clear to auscultation bilaterally Heart: irregularly irregular rhythm Extremities: he has ischemic-appearing ulcers on both legs and feet with absent pedal pulses  EKG performed today  ASSESSMENT AND PLAN:   Atherosclerosis of native arteries of the extremities with ulceration(440.23) The patient was referred to me that by Dr. Judene Companion at the wound care center. 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 He was referred for critical limb ischemia. He is an insulin-dependent diabetic on hemodialysis. He developed nonhealing ulcers on his feet this past January to be getting progressively worse. He underwent an angiography by Dr. Shanon Rosser is feeling vessels down to the level of the ankle which point his vessels appear to be significantly diseased. He was told that there were no percutaneous or surgical options in case his amputation. I reviewed his films and agree with that prognosis. I am going to refer him to Dr. Brunetta Jeans at [facility] for a third opinion.      Lorretta Harp MD [place], [facility] 06/25/2013 12:38 PM

## 2013-06-25 NOTE — Patient Instructions (Signed)
Follow up with Dr Berry as needed.  

## 2013-06-29 ENCOUNTER — Ambulatory Visit: Payer: Managed Care, Other (non HMO) | Admitting: Physical Therapy

## 2013-06-30 ENCOUNTER — Encounter (HOSPITAL_BASED_OUTPATIENT_CLINIC_OR_DEPARTMENT_OTHER): Payer: Managed Care, Other (non HMO)

## 2013-06-30 DIAGNOSIS — I70229 Atherosclerosis of native arteries of extremities with rest pain, unspecified extremity: Secondary | ICD-10-CM | POA: Insufficient documentation

## 2013-06-30 DIAGNOSIS — I998 Other disorder of circulatory system: Secondary | ICD-10-CM | POA: Insufficient documentation

## 2013-06-30 DIAGNOSIS — M79609 Pain in unspecified limb: Secondary | ICD-10-CM | POA: Insufficient documentation

## 2013-07-01 ENCOUNTER — Ambulatory Visit: Payer: Managed Care, Other (non HMO) | Admitting: Physical Therapy

## 2013-07-05 ENCOUNTER — Ambulatory Visit (INDEPENDENT_AMBULATORY_CARE_PROVIDER_SITE_OTHER): Payer: Managed Care, Other (non HMO) | Admitting: Surgery

## 2013-07-05 ENCOUNTER — Encounter (INDEPENDENT_AMBULATORY_CARE_PROVIDER_SITE_OTHER): Payer: Self-pay | Admitting: Surgery

## 2013-07-05 VITALS — BP 132/78 | HR 110 | Temp 98.6°F | Resp 24 | Ht 74.0 in | Wt 185.2 lb

## 2013-07-05 DIAGNOSIS — T85691A Other mechanical complication of intraperitoneal dialysis catheter, initial encounter: Secondary | ICD-10-CM

## 2013-07-05 DIAGNOSIS — N186 End stage renal disease: Secondary | ICD-10-CM

## 2013-07-05 DIAGNOSIS — T85611A Breakdown (mechanical) of intraperitoneal dialysis catheter, initial encounter: Secondary | ICD-10-CM | POA: Insufficient documentation

## 2013-07-05 NOTE — Progress Notes (Signed)
Subjective:     Patient ID: Paul [name], male   DOB: [birth year], 58 y.o.   MRN: [id number]  HPI  Note: This dictation was prepared with Dragon/digital dictation along with [facility] technology. Any transcriptional errors that result from this process are unintentional.       Paul [name]  Jan 19, 1956 [id number]  Patient Care Team: [name], MD as PCP - General (Family Medicine) Lucrezia Starch, MD (Nephrology) Ladell Pier, MD as Attending Physician (Internal Medicine) Angelia Mould, MD as Consulting Physician (Vascular Surgery)  Procedure (Date: 01/14/2013):  Diagnostic laparoscopy and placement of peritoneal dialysis catheter  This patient returns for surgical re-evaluation.  He has had issues with peritoneal dialysis and wishes to have the catheter removed.  The catheter functions fine.  However, he has had some episodes of weakness and falls at home.  He is concerned it may be related to that.  He also notes it is a lot more of a hassle to do the peritoneal dialysis with all the fluid bags.  It has been inconvenient that he thought it would be.  He is back to hemodialysis.  He seemed to tolerate that better now.  He has had worsening issues with foot ulcers and poor blood flow being treated with nitroglycerin patches and vascular and wound surgical care.  Troubles with depression but seems to be managing it better with better support and antidepressants.  Patient Active Problem List   Diagnosis Date Noted  . Intolerance to CAPD peritoneal dialysis  07/05/2013  . Atherosclerosis of native arteries of the extremities with ulceration(440.23) 06/17/2013  . Gait disorder 05/31/2013  . Weakness 05/24/2013  . Hypokalemia 04/04/2013  . Depression 04/03/2013  . GERD (gastroesophageal reflux disease) 04/03/2013  . Atrial fibrillation 04/02/2013  . Hypotension 04/02/2013  . DM (diabetes mellitus) 12/28/2012  . Other complications due to renal dialysis device, implant,  and graft 05/19/2012  . Anemia 07/02/2011  . End stage renal disease 05/29/2011    Past Medical History  Diagnosis Date  . Diabetic neuropathy   . Cancer     History of Duodenal Cancer  . Chronic kidney disease     Pt is not eligible for transplant per [facility]  . Diabetes mellitus     Type 2 iddm x 11 yrs  . Anemia     esrd  . Hypertension     meds d/c 'd  2 wks ago for postural hypotension  . Fatigue      tires easily x 2 months  . Chronic nausea     with  morning vomiting; better off  HTN meds  . Intermittent tremor   . Closed left arm fracture 1967  . Heart murmur   . Corneal abrasion, left   . Shortness of breath     exertional  . GERD (gastroesophageal reflux disease)   . History of blood transfusion   . Lack of coordination   . Muscle weakness (generalized)   . Glomerulosclerosis, diabetic   . Depression   . A-fib   . Critical lower limb ischemia   . SIRS (systemic inflammatory response syndrome) 04/02/2013    Past Surgical History  Procedure Laterality Date  . Whipple procedure  2002    duodenal ca, Dr. Harlow Asa  . Bone marrow biopsy  2012  . Renal biopsy, percutaneous  2012  . Tonsillectomy    . Portacath placement  2002  . Biliary diversion, external  2002  . Other surgical history  Cyst removed from back  . Av fistula placement  06/07/2011    Procedure: ARTERIOVENOUS (AV) FISTULA CREATION;  Surgeon: Angelia Mould, MD;  Location: [place];  Service: Vascular;  Laterality: Left;  . Ligation of arteriovenous  fistula  05/22/2012    Procedure: LIGATION OF ARTERIOVENOUS  FISTULA;  Surgeon: Mal Misty, MD;  Location: [place];  Service: Vascular;  Laterality: Left;  Left brachio-cephalic fistula  . Insertion of dialysis catheter  05/22/2012    Procedure: INSERTION OF DIALYSIS CATHETER;  Surgeon: Mal Misty, MD;  Location: [place];  Service: Vascular;  Laterality: N/A;  right internal jugular vein  . Av fistula placement  06/18/2012    Procedure:  ARTERIOVENOUS (AV) FISTULA CREATION;  Surgeon: Mal Misty, MD;  Location: [place];  Service: Vascular;  Laterality: Right;  [place]  . Colonoscopy      Hx: of  . Capd insertion N/A 01/14/2013    Procedure: LAPAROSCOPIC INSERTION CONTINUOUS AMBULATORY PERITONEAL DIALYSIS  (CAPD) CATHETER;  Surgeon: Adin Hector, MD;  Location: [place];  Service: General;  Laterality: N/A;  . Vascular surgery      History   Social History  . Marital Status: Married    Spouse Name: N/A    Number of Children: N/A  . Years of Education: N/A   Occupational History  . Not on file.   Social History Main Topics  . Smoking status: Never Smoker   . Smokeless tobacco: Never Used  . Alcohol Use: No  . Drug Use: No  . Sexual Activity: Not on file   Other Topics Concern  . Not on file   Social History Narrative  . No narrative on file    Family History  Problem Relation Age of Onset  . Cancer Mother   . Cancer Father     prostate cancer  . Heart disease Maternal Grandfather   . Stroke Paternal Grandmother     Current Outpatient Prescriptions  Medication Sig Dispense Refill  . aspirin EC 81 MG tablet Take 1 tablet (81 mg total) by mouth daily.      [name] atorvastatin (LIPITOR) 10 MG tablet Take 1 tablet (10 mg total) by mouth daily.  30 tablet  1  . darbepoetin (ARANESP) 200 MCG/0.4ML SOLN injection Inject 200 mcg into the skin.      . feeding supplement, RESOURCE BREEZE, (RESOURCE BREEZE) LIQD Take 1 Container by mouth daily.      [name] gabapentin (NEURONTIN) 300 MG capsule Take 1 capsule (300 mg total) by mouth at bedtime.  30 capsule  1  . HYDROcodone-acetaminophen (NORCO/VICODIN) 5-325 MG per tablet Take 1-2 tablets by mouth every 4 (four) hours as needed for moderate pain.  60 tablet  0  . Insulin Glargine (LANTUS SOLOSTAR) 100 UNIT/ML Solostar Pen Inject 5-10 Units into the skin 2 (two) times daily. Inject 5 units every morning and 10 units every night      . midodrine (PROAMATINE) 10 MG tablet Take  1 tablet (10 mg total) by mouth 3 (three) times daily with meals.  90 tablet  1  . multivitamin (RENA-VIT) TABS tablet Take 1 tablet by mouth at bedtime.  30 tablet  0  . omeprazole (PRILOSEC) 40 MG capsule Take 1 capsule (40 mg total) by mouth daily.  30 capsule  1  . sertraline (ZOLOFT) 100 MG tablet Take 1 tablet (100 mg total) by mouth daily.  30 tablet  1  . sevelamer carbonate (RENVELA) 800 MG tablet Take  2 tablets (1,600 mg total) by mouth 3 (three) times daily with meals.  90 tablet  1   No current facility-administered medications for this visit.     Allergies  Allergen Reactions  . Metformin And Related Other (See Comments)    Kidney problems    BP 132/78  Pulse 110  Temp(Src) 98.6 F (37 C) (Oral)  Resp 24  Ht $R'6\' 2"'Zq$  (1.88 m)  Wt 185 lb 3.2 oz (84.006 kg)  BMI 23.77 kg/m2  No results found.   Review of Systems  Constitutional: Positive for fatigue. Negative for fever, chills and diaphoresis.  HENT: Negative for sore throat and trouble swallowing.   Eyes: Negative for photophobia and visual disturbance.  Respiratory: Negative for choking and shortness of breath.   Cardiovascular: Negative for chest pain and palpitations.  Gastrointestinal: Negative for nausea, vomiting, diarrhea, constipation, blood in stool, abdominal distention, anal bleeding and rectal pain.  Genitourinary: Negative for dysuria, urgency, difficulty urinating and testicular pain.  Musculoskeletal: Positive for arthralgias, gait problem and myalgias. Negative for neck pain.  Skin: Positive for wound. Negative for color change and rash.  Neurological: Positive for weakness and light-headedness. Negative for dizziness, speech difficulty and numbness.  Hematological: Negative for adenopathy.  Psychiatric/Behavioral: Positive for dysphoric mood. Negative for suicidal ideas, hallucinations, confusion, self-injury and agitation.       Objective:   Physical Exam  Constitutional: He is oriented to  person, place, and time. He appears well-developed and well-nourished. No distress.  HENT:  Head: Normocephalic.  Mouth/Throat: Oropharynx is clear and moist. No oropharyngeal exudate.  Eyes: Conjunctivae and EOM are normal. Pupils are equal, round, and reactive to light. No scleral icterus.  Neck: Normal range of motion. No tracheal deviation present.  Cardiovascular: Normal rate, normal heart sounds and intact distal pulses.   Pulmonary/Chest: Effort normal. No respiratory distress.  Abdominal: Soft. He exhibits no distension. There is no tenderness. Hernia confirmed negative in the right inguinal area and confirmed negative in the left inguinal area.    Incisions clean with normal healing ridges.  No hernias  Musculoskeletal: Normal range of motion. He exhibits no tenderness.  Chronic wound bilateral feet.  Bandages clean  Neurological: He is alert and oriented to person, place, and time. No cranial nerve deficit. He exhibits normal muscle tone. Coordination normal.  Skin: Skin is warm and dry. No rash noted. He is not diaphoretic.  Psychiatric: He has a normal mood and affect. His behavior is normal.       Assessment:     Poor tolerance of peritoneal dialysis, wishing removal of CAPD catheter  Plan:     Per his wishes, we will remove it.  This will require LMA/sedation.  Plan to do an on and off dialysis today.  He does take a baby aspirin.  Given his significant peripheral vascular disease, continue that for now since risk of bleeding that bad with just 81 a day.  The anatomy & physiology of peritoneum was discussed.  Natural history risks without surgery of worsening renal failure was discussed.   I feel the risks of no intervention will lead to serious problems that outweigh the operative risks; therefore, I recommended removal of the CAPD peritoneal dialysis catheter.  I techniques with probable open approach.   Risks such as bleeding, infection, abscess, injury to other organs,  reoperation heart attack, death, and other risks were discussed.   I noted a good likelihood this will help address the problem.  Possibility that this will  not be enough to compensate for the renal failure & need for further treatment such as hemodialysis was explained.  Goals of post-operative recovery were discussed as well.  We will work to minimize complications.  Questions were answered.  The patient expresses understanding & wishes to proceed with surgery.

## 2013-07-05 NOTE — Patient Instructions (Signed)
We will plan to [facility] on and off dialysis today (Monday/Wednesday/Friday)  Please schedule with [place] at your convenience  GENERAL SURGERY: POST OP INSTRUCTIONS  1. DIET: Follow a light bland diet the first 24 hours after arrival home, such as soup, liquids, crackers, etc.  Be sure to include lots of fluids daily.  Avoid fast food or heavy meals as your are more likely to get nauseated.   2. Take your usually prescribed home medications unless otherwise directed. 3. PAIN CONTROL: a. Pain is best controlled by a usual combination of three different methods TOGETHER: i. Ice/Heat ii. Over the counter pain medication iii. Prescription pain medication b. Most patients will experience some swelling and bruising around the incisions.  Ice packs or heating pads (30-60 minutes up to 6 times a day) will help. Use ice for the first few days to help decrease swelling and bruising, then switch to heat to help relax tight/sore spots and speed recovery.  Some people prefer to use ice alone, heat alone, alternating between ice & heat.  Experiment to what works for you.  Swelling and bruising can take several weeks to resolve.   c. It is helpful to take an over-the-counter pain medication regularly for the first few weeks.  Choose one of the following that works best for you: i. Naproxen (Aleve, etc)  Two 220mg  tabs twice a day ii. Ibuprofen (Advil, etc) Three 200mg  tabs four times a day (every meal & bedtime) iii. Acetaminophen (Tylenol, etc) 500-650mg  four times a day (every meal & bedtime) d. A  prescription for pain medication (such as oxycodone, hydrocodone, etc) should be given to you upon discharge.  Take your pain medication as prescribed.  i. If you are having problems/concerns with the prescription medicine (does not control pain, nausea, vomiting, rash, itching, etc), please call us [phone] to see if we need to switch you to a different pain  medicine that will work better for you and/or control your side effect better. ii. If you need a refill on your pain medication, please contact your pharmacy.  They will contact our office to request authorization. Prescriptions will not be filled after 5 pm or on week-ends. 4. Avoid getting constipated.  Between the surgery and the pain medications, it is common to experience some constipation.  Increasing fluid intake and taking a fiber supplement (such as Metamucil, Citrucel, FiberCon, MiraLax, etc) 1-2 times a day regularly will usually help prevent this problem from occurring.  A mild laxative (prune juice, Milk of Magnesia, MiraLax, etc) should be taken according to package directions if there are no bowel movements after 48 hours.   5. Wash / shower every day.  You may shower over the dressings as they are waterproof.  Continue to shower over incision(s) after the dressing is off. 6. Remove your waterproof bandages 5 days after surgery.  You may leave the incision open to air.  You may have skin tapes (Steri Strips) covering the incision(s).  Leave them on until one week, then remove.  You may replace a dressing/Band-Aid to cover the incision for comfort if you wish.      7. ACTIVITIES as tolerated:   a. You may resume regular (light) daily activities beginning the next day-such as daily self-care, walking, climbing stairs-gradually increasing activities as tolerated.  If you can walk 30 minutes without difficulty, it is safe to try more intense activity such as jogging, treadmill, bicycling, low-impact aerobics, swimming, etc. b.  Save the most intensive and strenuous activity for last such as sit-ups, heavy lifting, contact sports, etc  Refrain from any heavy lifting or straining until you are off narcotics for pain control.   c. DO NOT PUSH THROUGH PAIN.  Let pain be your guide: If it hurts to do something, don't do it.  Pain is your body warning you to avoid that activity for another week until  the pain goes down. d. You may drive when you are no longer taking prescription pain medication, you can comfortably wear a seatbelt, and you can safely maneuver your car and apply brakes. e. Dennis Bast may have sexual intercourse when it is comfortable.  8. FOLLOW UP in our office a. Please call [facility] at [phone] to set up an appointment to see your surgeon in the office for a follow-up appointment approximately 2-3 weeks after your surgery. b. Make sure that you call for this appointment the day you arrive home to insure a convenient appointment time. 9. IF YOU HAVE DISABILITY OR FAMILY LEAVE FORMS, BRING THEM TO THE OFFICE FOR PROCESSING.  DO NOT GIVE THEM TO YOUR DOCTOR.   WHEN TO CALL us [phone]: 1. Poor pain control 2. Reactions / problems with new medications (rash/itching, nausea, etc)  3. Fever over 101.5 F (38.5 C) 4. Worsening swelling or bruising 5. Continued bleeding from incision. 6. Increased pain, redness, or drainage from the incision 7. Difficulty breathing / swallowing   The clinic staff is available to answer your questions during regular business hours (8:30am-5pm).  Please don't hesitate to call and ask to speak to one of our nurses for clinical concerns.   If you have a medical emergency, go to the nearest emergency room or call 911.  A surgeon from [facility] is always on call at the [facility], [place], [place], [place], [place]  [address] ? MAIN: [phone] ? TOLL FREE: [phone] ?  FAX [phone] [email or web address]

## 2013-07-05 NOTE — Progress Notes (Signed)
[facility]  NAME:  [name], [name]                      ACCOUNT NO.:  MEDICAL RECORD NO.:  [id number]      DATE OF BIRTH:  [birth year]  PHYSICIAN:  Judene Companion, M.D.           VISIT DATE:                                  OFFICE VISIT   I am dictating a note to [employer] in an attempt to help [place] in the treatment with Paul [name].  This gentleman has been a patient of ours for several weeks.  He is a diabetic and has severe problems with multiple diabetic ulcers on his legs.  He actually has four fairly large Wagner 3 and Wagner 4 ulcers. His left great toe has dry gangrene.  The others have quite a bit of necrosis going down into the subcu tissue and the fascial tissue.  On the right leg, he has got similar issues with 4-5 cm ulcers on the leg and a 2-cm ulcer on the plantar surface of his right great toe.  This gentleman is only 58 years of age.  He is very bright and very alert. His blood pressure is 146/80, his pulse is 80, his temperature is 98. He weighs 185 pounds.  The problems that he has besides the diabetes is he is in total renal failure and is on dialysis 3 times a week.  I have sent him to Vascular and they are quite concerned about his small vessels in both of his feet.  His large vessels are widely patent and he was sent to Dr. Andree Elk who is a noted vascular surgeon in [place] at the [place] [facility] and he did angioplasties on his anterior tibial vessels.  Hopefully, this will increase his blood supply enough to help him to heal.  I think ultimately, he is going to lose some of his toes, especially his left great toe, I am sure will need to be amputated.  What I am trying to do is save as much as his extremities as possible and perhaps get by with transmetatarsal amputations and maybe even I will be able to finally put some Apligrafs or Dermagrafts on the ulcers on his legs, but I think that there is no question  that hyperbaric oxygen would be an adjunct that may help him considerably. So, I am asking the [employer] if they would give [place] the okay to give him 6 weeks, which would be 30 treatments of hyperbaric oxygen, 2 hours a day, Monday through Friday.  I think if we gave him the 100% oxygen at 2-1/2 atmospheres, we could increase his tissue oxygen saturation enough so we have a good chance to keep him from having to have bilateral amputations.  I think if we can keep it down to the digits and it would keep him walking, so I am asking for you to consider this as a adjunct to his treatment as I think it would be important.     Judene Companion, M.D.     PP/MEDQ  D:  07/02/2013  T:  07/03/2013  Job:  [id number]

## 2013-07-06 ENCOUNTER — Encounter (HOSPITAL_COMMUNITY): Payer: Self-pay | Admitting: Pharmacy Technician

## 2013-07-06 ENCOUNTER — Ambulatory Visit: Payer: Managed Care, Other (non HMO) | Admitting: Physical Therapy

## 2013-07-07 ENCOUNTER — Encounter (HOSPITAL_COMMUNITY): Payer: Self-pay | Admitting: *Deleted

## 2013-07-07 MED ORDER — ALTEPLASE 2 MG IJ SOLR: 2.0000 mg | Freq: Once | INTRAMUSCULAR | Status: DC | PRN

## 2013-07-07 MED ORDER — PENTAFLUOROPROP-TETRAFLUOROETH EX AERO: 1.0000 "application " | INHALATION_SPRAY | CUTANEOUS | Status: DC | PRN

## 2013-07-07 MED ORDER — HEPARIN SODIUM (PORCINE) 1000 UNIT/ML DIALYSIS: 20.0000 [IU]/kg | INTRAMUSCULAR | Status: DC | PRN

## 2013-07-07 MED ORDER — SODIUM CHLORIDE 0.9 % IV SOLN: 100.0000 mL | INTRAVENOUS | Status: DC | PRN

## 2013-07-07 MED ORDER — HEPARIN SODIUM (PORCINE) 1000 UNIT/ML DIALYSIS: 1000.0000 [IU] | INTRAMUSCULAR | Status: DC | PRN

## 2013-07-07 MED ORDER — NEPRO/CARBSTEADY PO LIQD: 237.0000 mL | ORAL | Status: DC | PRN

## 2013-07-07 MED ORDER — LIDOCAINE HCL (PF) 1 % IJ SOLN: 5.0000 mL | INTRAMUSCULAR | Status: DC | PRN

## 2013-07-07 MED ORDER — LIDOCAINE-PRILOCAINE 2.5-2.5 % EX CREA: 1.0000 "application " | TOPICAL_CREAM | CUTANEOUS | Status: DC | PRN

## 2013-07-07 MED ORDER — ALTEPLASE 2 MG IJ SOLR
2.0000 mg | Freq: Once | INTRAMUSCULAR | Status: DC | PRN
Start: 2013-07-07 — End: 2013-07-07

## 2013-07-08 ENCOUNTER — Ambulatory Visit: Payer: Managed Care, Other (non HMO) | Admitting: Physical Therapy

## 2013-07-08 MED ORDER — CEFAZOLIN SODIUM-DEXTROSE 2-3 GM-% IV SOLR: 2.0000 g | INTRAVENOUS | Status: DC

## 2013-07-09 ENCOUNTER — Encounter (HOSPITAL_COMMUNITY): Payer: Self-pay | Admitting: *Deleted

## 2013-07-09 ENCOUNTER — Observation Stay (HOSPITAL_COMMUNITY)
Admission: EM | Admit: 2013-07-09 | Discharge: 2013-07-13 | Disposition: A | Discharge status: Home / Self Care | Payer: Managed Care, Other (non HMO) | Attending: Internal Medicine | Admitting: Internal Medicine

## 2013-07-09 ENCOUNTER — Encounter (HOSPITAL_COMMUNITY)
Admission: RE | Disposition: A | Discharge status: Home / Self Care | Payer: Self-pay | Source: Ambulatory Visit | Attending: Surgery

## 2013-07-09 ENCOUNTER — Ambulatory Visit (HOSPITAL_COMMUNITY)
Admission: RE | Admit: 2013-07-09 | Discharge: 2013-07-09 | Disposition: A | Discharge status: Home / Self Care | Payer: Managed Care, Other (non HMO) | Source: Ambulatory Visit | Attending: Surgery | Admitting: Surgery

## 2013-07-09 ENCOUNTER — Encounter (HOSPITAL_COMMUNITY): Payer: Self-pay | Admitting: Emergency Medicine

## 2013-07-09 ENCOUNTER — Ambulatory Visit (HOSPITAL_COMMUNITY): Payer: Managed Care, Other (non HMO) | Admitting: Anesthesiology

## 2013-07-09 ENCOUNTER — Encounter (HOSPITAL_COMMUNITY): Payer: Managed Care, Other (non HMO) | Admitting: Anesthesiology

## 2013-07-09 ENCOUNTER — Observation Stay (HOSPITAL_COMMUNITY): Payer: Managed Care, Other (non HMO)

## 2013-07-09 DIAGNOSIS — R259 Unspecified abnormal involuntary movements: Secondary | ICD-10-CM | POA: Insufficient documentation

## 2013-07-09 DIAGNOSIS — N186 End stage renal disease: Secondary | ICD-10-CM | POA: Insufficient documentation

## 2013-07-09 DIAGNOSIS — I70209 Unspecified atherosclerosis of native arteries of extremities, unspecified extremity: Secondary | ICD-10-CM

## 2013-07-09 DIAGNOSIS — K219 Gastro-esophageal reflux disease without esophagitis: Secondary | ICD-10-CM

## 2013-07-09 DIAGNOSIS — E1129 Type 2 diabetes mellitus with other diabetic kidney complication: Secondary | ICD-10-CM | POA: Insufficient documentation

## 2013-07-09 DIAGNOSIS — Z7982 Long term (current) use of aspirin: Secondary | ICD-10-CM | POA: Insufficient documentation

## 2013-07-09 DIAGNOSIS — I120 Hypertensive chronic kidney disease with stage 5 chronic kidney disease or end stage renal disease: Secondary | ICD-10-CM | POA: Insufficient documentation

## 2013-07-09 DIAGNOSIS — E1142 Type 2 diabetes mellitus with diabetic polyneuropathy: Secondary | ICD-10-CM | POA: Insufficient documentation

## 2013-07-09 DIAGNOSIS — L97509 Non-pressure chronic ulcer of other part of unspecified foot with unspecified severity: Secondary | ICD-10-CM | POA: Insufficient documentation

## 2013-07-09 DIAGNOSIS — L03115 Cellulitis of right lower limb: Secondary | ICD-10-CM

## 2013-07-09 DIAGNOSIS — Z794 Long term (current) use of insulin: Secondary | ICD-10-CM | POA: Insufficient documentation

## 2013-07-09 DIAGNOSIS — Z79899 Other long term (current) drug therapy: Secondary | ICD-10-CM

## 2013-07-09 DIAGNOSIS — M25519 Pain in unspecified shoulder: Secondary | ICD-10-CM | POA: Insufficient documentation

## 2013-07-09 DIAGNOSIS — W19XXXA Unspecified fall, initial encounter: Secondary | ICD-10-CM | POA: Insufficient documentation

## 2013-07-09 DIAGNOSIS — R531 Weakness: Secondary | ICD-10-CM

## 2013-07-09 DIAGNOSIS — R55 Syncope and collapse: Principal | ICD-10-CM

## 2013-07-09 DIAGNOSIS — R031 Nonspecific low blood-pressure reading: Secondary | ICD-10-CM | POA: Insufficient documentation

## 2013-07-09 DIAGNOSIS — Z992 Dependence on renal dialysis: Secondary | ICD-10-CM

## 2013-07-09 DIAGNOSIS — I739 Peripheral vascular disease, unspecified: Secondary | ICD-10-CM

## 2013-07-09 DIAGNOSIS — N2581 Secondary hyperparathyroidism of renal origin: Secondary | ICD-10-CM | POA: Insufficient documentation

## 2013-07-09 DIAGNOSIS — F3289 Other specified depressive episodes: Secondary | ICD-10-CM | POA: Insufficient documentation

## 2013-07-09 DIAGNOSIS — E1159 Type 2 diabetes mellitus with other circulatory complications: Secondary | ICD-10-CM | POA: Insufficient documentation

## 2013-07-09 DIAGNOSIS — N269 Renal sclerosis, unspecified: Secondary | ICD-10-CM | POA: Insufficient documentation

## 2013-07-09 DIAGNOSIS — Z4902 Encounter for fitting and adjustment of peritoneal dialysis catheter: Secondary | ICD-10-CM | POA: Insufficient documentation

## 2013-07-09 DIAGNOSIS — F329 Major depressive disorder, single episode, unspecified: Secondary | ICD-10-CM

## 2013-07-09 DIAGNOSIS — E119 Type 2 diabetes mellitus without complications: Secondary | ICD-10-CM

## 2013-07-09 DIAGNOSIS — I70269 Atherosclerosis of native arteries of extremities with gangrene, unspecified extremity: Secondary | ICD-10-CM | POA: Insufficient documentation

## 2013-07-09 DIAGNOSIS — R0602 Shortness of breath: Secondary | ICD-10-CM | POA: Insufficient documentation

## 2013-07-09 DIAGNOSIS — M949 Disorder of cartilage, unspecified: Secondary | ICD-10-CM

## 2013-07-09 DIAGNOSIS — T85611A Breakdown (mechanical) of intraperitoneal dialysis catheter, initial encounter: Secondary | ICD-10-CM

## 2013-07-09 DIAGNOSIS — L03119 Cellulitis of unspecified part of limb: Secondary | ICD-10-CM

## 2013-07-09 DIAGNOSIS — T82898A Other specified complication of vascular prosthetic devices, implants and grafts, initial encounter: Secondary | ICD-10-CM

## 2013-07-09 DIAGNOSIS — R011 Cardiac murmur, unspecified: Secondary | ICD-10-CM | POA: Insufficient documentation

## 2013-07-09 DIAGNOSIS — E1149 Type 2 diabetes mellitus with other diabetic neurological complication: Secondary | ICD-10-CM | POA: Insufficient documentation

## 2013-07-09 DIAGNOSIS — I959 Hypotension, unspecified: Secondary | ICD-10-CM

## 2013-07-09 DIAGNOSIS — M899 Disorder of bone, unspecified: Secondary | ICD-10-CM | POA: Insufficient documentation

## 2013-07-09 DIAGNOSIS — L02419 Cutaneous abscess of limb, unspecified: Secondary | ICD-10-CM | POA: Insufficient documentation

## 2013-07-09 DIAGNOSIS — I4891 Unspecified atrial fibrillation: Secondary | ICD-10-CM | POA: Insufficient documentation

## 2013-07-09 DIAGNOSIS — D649 Anemia, unspecified: Secondary | ICD-10-CM

## 2013-07-09 DIAGNOSIS — F32A Depression, unspecified: Secondary | ICD-10-CM

## 2013-07-09 DIAGNOSIS — D62 Acute posthemorrhagic anemia: Secondary | ICD-10-CM

## 2013-07-09 DIAGNOSIS — E876 Hypokalemia: Secondary | ICD-10-CM

## 2013-07-09 DIAGNOSIS — R279 Unspecified lack of coordination: Secondary | ICD-10-CM | POA: Insufficient documentation

## 2013-07-09 DIAGNOSIS — L98499 Non-pressure chronic ulcer of skin of other sites with unspecified severity: Secondary | ICD-10-CM

## 2013-07-09 DIAGNOSIS — D72829 Elevated white blood cell count, unspecified: Secondary | ICD-10-CM | POA: Insufficient documentation

## 2013-07-09 DIAGNOSIS — R269 Unspecified abnormalities of gait and mobility: Secondary | ICD-10-CM

## 2013-07-09 DIAGNOSIS — L03116 Cellulitis of left lower limb: Secondary | ICD-10-CM

## 2013-07-09 HISTORY — PX: CAPD REMOVAL: SHX5234

## 2013-07-09 HISTORY — DX: Other disorders of calcium metabolism: E83.59

## 2013-07-09 LAB — POCT I-STAT 4, (NA,K, GLUC, HGB,HCT)
GLUCOSE: 117 mg/dL — AB (ref 70–99)
HEMATOCRIT: 44 % (ref 39.0–52.0)
HEMOGLOBIN: 15 g/dL (ref 13.0–17.0)
POTASSIUM: 4.8 meq/L (ref 3.7–5.3)
Sodium: 139 mEq/L (ref 137–147)

## 2013-07-09 LAB — URINALYSIS, ROUTINE W REFLEX MICROSCOPIC
Bilirubin Urine: NEGATIVE
GLUCOSE, UA: 100 mg/dL — AB
Ketones, ur: NEGATIVE mg/dL
Leukocytes, UA: NEGATIVE
Nitrite: NEGATIVE
Protein, ur: 30 mg/dL — AB
SPECIFIC GRAVITY, URINE: 1.01 (ref 1.005–1.030)
Urobilinogen, UA: 0.2 mg/dL (ref 0.0–1.0)
pH: 7.5 (ref 5.0–8.0)

## 2013-07-09 LAB — CBC WITH DIFFERENTIAL/PLATELET
BASOS ABS: 0 10*3/uL (ref 0.0–0.1)
Basophils Relative: 0 % (ref 0–1)
Eosinophils Absolute: 0.1 10*3/uL (ref 0.0–0.7)
Eosinophils Relative: 1 % (ref 0–5)
HCT: 32.8 % — ABNORMAL LOW (ref 39.0–52.0)
Hemoglobin: 10.1 g/dL — ABNORMAL LOW (ref 13.0–17.0)
LYMPHS ABS: 1.2 10*3/uL (ref 0.7–4.0)
LYMPHS PCT: 6 % — AB (ref 12–46)
MCH: 29.3 pg (ref 26.0–34.0)
MCHC: 30.8 g/dL (ref 30.0–36.0)
MCV: 95.1 fL (ref 78.0–100.0)
Monocytes Absolute: 1.6 10*3/uL — ABNORMAL HIGH (ref 0.1–1.0)
Monocytes Relative: 8 % (ref 3–12)
NEUTROS ABS: 16.4 10*3/uL — AB (ref 1.7–7.7)
Neutrophils Relative %: 85 % — ABNORMAL HIGH (ref 43–77)
PLATELETS: 333 10*3/uL (ref 150–400)
RBC: 3.45 MIL/uL — AB (ref 4.22–5.81)
RDW: 15.9 % — AB (ref 11.5–15.5)
WBC: 19.3 10*3/uL — ABNORMAL HIGH (ref 4.0–10.5)

## 2013-07-09 LAB — BASIC METABOLIC PANEL
BUN: 39 mg/dL — ABNORMAL HIGH (ref 6–23)
CO2: 23 meq/L (ref 19–32)
Calcium: 8.1 mg/dL — ABNORMAL LOW (ref 8.4–10.5)
Chloride: 96 mEq/L (ref 96–112)
Creatinine, Ser: 5.76 mg/dL — ABNORMAL HIGH (ref 0.50–1.35)
GFR calc Af Amer: 11 mL/min — ABNORMAL LOW (ref >90)
GFR calc non Af Amer: 10 mL/min — ABNORMAL LOW (ref >90)
GLUCOSE: 139 mg/dL — AB (ref 70–99)
POTASSIUM: 4.8 meq/L (ref 3.7–5.3)
Sodium: 139 mEq/L (ref 137–147)

## 2013-07-09 LAB — URINE MICROSCOPIC-ADD ON

## 2013-07-09 LAB — GLUCOSE, CAPILLARY
GLUCOSE-CAPILLARY: 101 mg/dL — AB (ref 70–99)
Glucose-Capillary: 107 mg/dL — ABNORMAL HIGH (ref 70–99)
Glucose-Capillary: 123 mg/dL — ABNORMAL HIGH (ref 70–99)

## 2013-07-09 LAB — I-STAT TROPONIN, ED: TROPONIN I, POC: 0.01 ng/mL (ref 0.00–0.08)

## 2013-07-09 LAB — MRSA PCR SCREENING: MRSA BY PCR: NEGATIVE

## 2013-07-09 SURGERY — CONTINUOUS AMBULATORY PERITONEAL DIALYSIS  (CAPD) CATHETER REMOVAL
Anesthesia: General | Site: Abdomen

## 2013-07-09 MED ORDER — SERTRALINE HCL 50 MG PO TABS
50.0000 mg | ORAL_TABLET | Freq: Every day | ORAL | Status: DC
  Administered 2013-07-10 – 2013-07-13 (×5): 50 mg via ORAL
  Filled 2013-07-09 (×5): qty 1

## 2013-07-09 MED ORDER — BUPIVACAINE-EPINEPHRINE (PF) 0.25% -1:200000 IJ SOLN
INTRAMUSCULAR | Status: AC
  Filled 2013-07-09: qty 30

## 2013-07-09 MED ORDER — PROPOFOL 10 MG/ML IV BOLUS
INTRAVENOUS | Status: AC
  Filled 2013-07-09: qty 20

## 2013-07-09 MED ORDER — OXYCODONE-ACETAMINOPHEN 5-325 MG PO TABS: 1.0000 | ORAL_TABLET | ORAL | Status: DC | PRN

## 2013-07-09 MED ORDER — ONDANSETRON HCL 4 MG/2ML IJ SOLN: 4.0000 mg | Freq: Four times a day (QID) | INTRAMUSCULAR | Status: DC | PRN

## 2013-07-09 MED ORDER — LIDOCAINE HCL (CARDIAC) 20 MG/ML IV SOLN
INTRAVENOUS | Status: DC | PRN
  Administered 2013-07-09: 50 mg via INTRAVENOUS

## 2013-07-09 MED ORDER — PHENYLEPHRINE HCL 10 MG/ML IJ SOLN
INTRAMUSCULAR | Status: DC | PRN
  Administered 2013-07-09 (×2): 80 ug via INTRAVENOUS

## 2013-07-09 MED ORDER — SODIUM CHLORIDE 0.9 % IJ SOLN
3.0000 mL | Freq: Two times a day (BID) | INTRAMUSCULAR | Status: DC
  Administered 2013-07-09 – 2013-07-13 (×8): 3 mL via INTRAVENOUS

## 2013-07-09 MED ORDER — OXYCODONE-ACETAMINOPHEN 5-325 MG PO TABS
1.0000 | ORAL_TABLET | ORAL | Status: DC | PRN
  Administered 2013-07-10 – 2013-07-13 (×5): 2 via ORAL
  Administered 2013-07-13: 1 via ORAL
  Filled 2013-07-09 (×2): qty 2
  Filled 2013-07-09 (×2): qty 1
  Filled 2013-07-09 (×2): qty 2

## 2013-07-09 MED ORDER — FENTANYL CITRATE 0.05 MG/ML IJ SOLN
INTRAMUSCULAR | Status: DC | PRN
  Administered 2013-07-09: 25 ug via INTRAVENOUS

## 2013-07-09 MED ORDER — ACETAMINOPHEN 500 MG PO TABS: 1000.0000 mg | ORAL_TABLET | Freq: Two times a day (BID) | ORAL | Status: DC | PRN

## 2013-07-09 MED ORDER — HEPARIN SODIUM (PORCINE) 5000 UNIT/ML IJ SOLN
5000.0000 [IU] | Freq: Three times a day (TID) | INTRAMUSCULAR | Status: DC
  Administered 2013-07-09 – 2013-07-13 (×11): 5000 [IU] via SUBCUTANEOUS
  Filled 2013-07-09 (×14): qty 1

## 2013-07-09 MED ORDER — FENTANYL CITRATE 0.05 MG/ML IJ SOLN
INTRAMUSCULAR | Status: AC
  Filled 2013-07-09: qty 5

## 2013-07-09 MED ORDER — ASPIRIN EC 81 MG PO TBEC
81.0000 mg | DELAYED_RELEASE_TABLET | Freq: Every day | ORAL | Status: DC
  Administered 2013-07-10 – 2013-07-13 (×5): 81 mg via ORAL
  Filled 2013-07-09 (×7): qty 1

## 2013-07-09 MED ORDER — MIDAZOLAM HCL 2 MG/2ML IJ SOLN
INTRAMUSCULAR | Status: AC
  Filled 2013-07-09: qty 2

## 2013-07-09 MED ORDER — 0.9 % SODIUM CHLORIDE (POUR BTL) OPTIME
TOPICAL | Status: DC | PRN
  Administered 2013-07-09: 1000 mL

## 2013-07-09 MED ORDER — PANTOPRAZOLE SODIUM 40 MG PO TBEC
80.0000 mg | DELAYED_RELEASE_TABLET | Freq: Every day | ORAL | Status: DC
  Administered 2013-07-10 – 2013-07-13 (×5): 80 mg via ORAL
  Filled 2013-07-09 (×6): qty 2

## 2013-07-09 MED ORDER — SEVELAMER CARBONATE 800 MG PO TABS
1600.0000 mg | ORAL_TABLET | Freq: Three times a day (TID) | ORAL | Status: DC
  Administered 2013-07-10 – 2013-07-13 (×12): 1600 mg via ORAL
  Filled 2013-07-09 (×16): qty 2

## 2013-07-09 MED ORDER — BUPIVACAINE-EPINEPHRINE 0.25% -1:200000 IJ SOLN
INTRAMUSCULAR | Status: DC | PRN
  Administered 2013-07-09: 30 mL

## 2013-07-09 MED ORDER — FENTANYL CITRATE 0.05 MG/ML IJ SOLN
50.0000 ug | Freq: Once | INTRAMUSCULAR | Status: AC
  Administered 2013-07-09: 50 ug via INTRAMUSCULAR
  Filled 2013-07-09: qty 2

## 2013-07-09 MED ORDER — OXYCODONE HCL 5 MG/5ML PO SOLN
5.0000 mg | Freq: Once | ORAL | Status: DC | PRN
Start: 2013-07-09 — End: 2013-07-09

## 2013-07-09 MED ORDER — SODIUM CHLORIDE 0.9 % IV SOLN
Freq: Once | INTRAVENOUS | Status: AC
  Administered 2013-07-09: 11:00:00 via INTRAVENOUS

## 2013-07-09 MED ORDER — MIDAZOLAM HCL 5 MG/5ML IJ SOLN
INTRAMUSCULAR | Status: DC | PRN
  Administered 2013-07-09: 2 mg via INTRAVENOUS

## 2013-07-09 MED ORDER — CEFAZOLIN SODIUM-DEXTROSE 2-3 GM-% IV SOLR
INTRAVENOUS | Status: AC
  Administered 2013-07-09: 2 g via INTRAVENOUS
  Filled 2013-07-09: qty 50

## 2013-07-09 MED ORDER — ONDANSETRON HCL 4 MG/2ML IJ SOLN
INTRAMUSCULAR | Status: DC | PRN
  Administered 2013-07-09: 4 mg via INTRAVENOUS

## 2013-07-09 MED ORDER — FENTANYL CITRATE 0.05 MG/ML IJ SOLN: 25.0000 ug | INTRAMUSCULAR | Status: DC | PRN

## 2013-07-09 MED ORDER — INSULIN GLARGINE 100 UNIT/ML ~~LOC~~ SOLN
5.0000 [IU] | Freq: Every day | SUBCUTANEOUS | Status: DC
  Administered 2013-07-10 – 2013-07-13 (×4): 5 [IU] via SUBCUTANEOUS
  Filled 2013-07-09 (×6): qty 0.05

## 2013-07-09 MED ORDER — HYDROCODONE-ACETAMINOPHEN 5-325 MG PO TABS
2.0000 | ORAL_TABLET | Freq: Two times a day (BID) | ORAL | Status: DC | PRN
  Administered 2013-07-10: 2 via ORAL
  Filled 2013-07-09: qty 2

## 2013-07-09 MED ORDER — PROPOFOL 10 MG/ML IV BOLUS
INTRAVENOUS | Status: DC | PRN
  Administered 2013-07-09: 110 mg via INTRAVENOUS

## 2013-07-09 MED ORDER — OXYCODONE HCL 5 MG PO TABS: 5.0000 mg | ORAL_TABLET | ORAL | Status: DC | PRN

## 2013-07-09 MED ORDER — INSULIN GLARGINE 100 UNIT/ML ~~LOC~~ SOLN
10.0000 [IU] | Freq: Every day | SUBCUTANEOUS | Status: DC
  Administered 2013-07-10 – 2013-07-11 (×3): 10 [IU] via SUBCUTANEOUS
  Filled 2013-07-09 (×7): qty 0.1

## 2013-07-09 MED ORDER — PHENYLEPHRINE 40 MCG/ML (10ML) SYRINGE FOR IV PUSH (FOR BLOOD PRESSURE SUPPORT)
PREFILLED_SYRINGE | INTRAVENOUS | Status: AC
  Filled 2013-07-09: qty 10

## 2013-07-09 MED ORDER — SODIUM CHLORIDE 0.9 % IV BOLUS (SEPSIS)
1000.0000 mL | Freq: Once | INTRAVENOUS | Status: AC
  Administered 2013-07-09: 1000 mL via INTRAVENOUS

## 2013-07-09 MED ORDER — OXYCODONE HCL 5 MG PO TABS: 5.0000 mg | ORAL_TABLET | Freq: Once | ORAL | Status: DC | PRN

## 2013-07-09 MED ORDER — ATORVASTATIN CALCIUM 10 MG PO TABS
10.0000 mg | ORAL_TABLET | Freq: Every day | ORAL | Status: DC
  Administered 2013-07-10 – 2013-07-12 (×4): 10 mg via ORAL
  Filled 2013-07-09 (×6): qty 1

## 2013-07-09 MED ORDER — NITROGLYCERIN 0.2 MG/HR TD PT24
0.2000 mg | MEDICATED_PATCH | Freq: Every day | TRANSDERMAL | Status: DC
  Filled 2013-07-09: qty 1

## 2013-07-09 MED ORDER — GABAPENTIN 300 MG PO CAPS
300.0000 mg | ORAL_CAPSULE | Freq: Every day | ORAL | Status: DC
  Administered 2013-07-09 – 2013-07-12 (×4): 300 mg via ORAL
  Filled 2013-07-09 (×5): qty 1

## 2013-07-09 MED ORDER — RENA-VITE PO TABS
1.0000 | ORAL_TABLET | Freq: Every day | ORAL | Status: DC
  Administered 2013-07-10 – 2013-07-12 (×4): 1 via ORAL
  Filled 2013-07-09 (×6): qty 1

## 2013-07-09 SURGICAL SUPPLY — 49 items
BLADE SURG 15 STRL LF DISP TIS (BLADE) ×1 IMPLANT
BLADE SURG 15 STRL SS (BLADE) ×3
BLADE SURG ROTATE 9660 (MISCELLANEOUS) IMPLANT
CANISTER SUCTION 2500CC (MISCELLANEOUS) ×2 IMPLANT
CHLORAPREP W/TINT 26ML (MISCELLANEOUS) ×3 IMPLANT
COVER SURGICAL LIGHT HANDLE (MISCELLANEOUS) ×3 IMPLANT
DECANTER SPIKE VIAL GLASS SM (MISCELLANEOUS) ×3 IMPLANT
DRAPE LAPAROSCOPIC ABDOMINAL (DRAPES) ×2 IMPLANT
DRAPE LAPAROTOMY T 102X78X121 (DRAPES) ×3 IMPLANT
DRSG TEGADERM 4X4.75 (GAUZE/BANDAGES/DRESSINGS) ×3 IMPLANT
ELECT CAUTERY BLADE 6.4 (BLADE) ×3 IMPLANT
ELECT REM PT RETURN 9FT ADLT (ELECTROSURGICAL) ×3
ELECTRODE REM PT RTRN 9FT ADLT (ELECTROSURGICAL) ×1 IMPLANT
GAUZE SPONGE 2X2 8PLY STRL LF (GAUZE/BANDAGES/DRESSINGS) ×1 IMPLANT
GAUZE SPONGE 4X4 16PLY XRAY LF (GAUZE/BANDAGES/DRESSINGS) ×3 IMPLANT
GLOVE BIO SURGEON STRL SZ7.5 (GLOVE) ×2 IMPLANT
GLOVE BIOGEL PI IND STRL 6.5 (GLOVE) IMPLANT
GLOVE BIOGEL PI IND STRL 7.0 (GLOVE) IMPLANT
GLOVE BIOGEL PI IND STRL 7.5 (GLOVE) IMPLANT
GLOVE BIOGEL PI IND STRL 8 (GLOVE) ×1 IMPLANT
GLOVE BIOGEL PI INDICATOR 6.5 (GLOVE) ×2
GLOVE BIOGEL PI INDICATOR 7.0 (GLOVE) ×2
GLOVE BIOGEL PI INDICATOR 7.5 (GLOVE) ×6
GLOVE BIOGEL PI INDICATOR 8 (GLOVE) ×2
GLOVE ECLIPSE 8.0 STRL XLNG CF (GLOVE) ×3 IMPLANT
GLOVE SS BIOGEL STRL SZ 6.5 (GLOVE) IMPLANT
GLOVE SUPERSENSE BIOGEL SZ 6.5 (GLOVE) ×2
GOWN STRL NON-REIN LRG LVL3 (GOWN DISPOSABLE) ×7 IMPLANT
GOWN STRL REIN XL XLG (GOWN DISPOSABLE) ×3 IMPLANT
KIT BASIN OR (CUSTOM PROCEDURE TRAY) ×3 IMPLANT
KIT ROOM TURNOVER OR (KITS) ×3 IMPLANT
NEEDLE 22X1 1/2 (OR ONLY) (NEEDLE) ×3 IMPLANT
NS IRRIG 1000ML POUR BTL (IV SOLUTION) ×3 IMPLANT
PACK SURGICAL SETUP 50X90 (CUSTOM PROCEDURE TRAY) ×3 IMPLANT
PAD ARMBOARD 7.5X6 YLW CONV (MISCELLANEOUS) ×6 IMPLANT
PENCIL BUTTON HOLSTER BLD 10FT (ELECTRODE) ×3 IMPLANT
SPONGE GAUZE 2X2 STER 10/PKG (GAUZE/BANDAGES/DRESSINGS) ×2
SUT MNCRL AB 4-0 PS2 18 (SUTURE) ×3 IMPLANT
SUT VICRYL 0 UR6 27IN ABS (SUTURE) ×3 IMPLANT
SWAB COLLECTION DEVICE MRSA (MISCELLANEOUS) IMPLANT
SYR BULB 3OZ (MISCELLANEOUS) ×3 IMPLANT
SYR CONTROL 10ML LL (SYRINGE) ×3 IMPLANT
TOWEL OR 17X24 6PK STRL BLUE (TOWEL DISPOSABLE) ×3 IMPLANT
TOWEL OR 17X26 10 PK STRL BLUE (TOWEL DISPOSABLE) ×3 IMPLANT
TUBE ANAEROBIC SPECIMEN COL (MISCELLANEOUS) IMPLANT
TUBE CONNECTING 12'X1/4 (SUCTIONS) ×1
TUBE CONNECTING 12X1/4 (SUCTIONS) ×2 IMPLANT
WATER STERILE IRR 1000ML POUR (IV SOLUTION) IMPLANT
YANKAUER SUCT BULB TIP NO VENT (SUCTIONS) ×3 IMPLANT

## 2013-07-09 NOTE — ED Notes (Signed)
While  Doing orthostatic VS. During the standing position pt began to bob head and bounce, stated was very dizzy. Not sure is standing BP accurate, but put under ortho anyway..  Could not perform a second set of vitals while standing.Marland KitchenMarland Kitchen

## 2013-07-09 NOTE — Transfer of Care (Signed)
Immediate Anesthesia Transfer of Care Note  Patient: Paul [name]  Procedure(s) Performed: Procedure(s): CONTINUOUS AMBULATORY PERITONEAL DIALYSIS  (CAPD) CATHETER REMOVAL (N/A)  Patient Location: PACU  Anesthesia Type:General  Level of Consciousness: awake, alert  and oriented  Airway & Oxygen Therapy: Patient Spontanous Breathing  Post-op Assessment: Report given to PACU RN and Post -op Vital signs reviewed and stable  Post vital signs: Reviewed and stable  Complications: No apparent anesthesia complications

## 2013-07-09 NOTE — H&P (Signed)
[facility] History and Physical  [name] FIE:[id number] DOB: 05-21-1955 DOA: 07/09/2013  Referring physician: EDP PCP: [name], MD   Chief Complaint: Syncope   HPI: Paul [name] is a 58 y.o. male who presents to the ED after multiple episodes of syncope.  Syncopal episodes occurred after arriving home and getting out of his car after just having had his PD catheter removed at [facility] today.  Patient has ESRD dialysis TTS.  PD catheter removal was due to intolerance to PD not due to infection.  Patient also had syncope with EMS, BP 98/76.  Hospitalist asked to admit for syncope.  Review of Systems: Systems reviewed.  As above, otherwise negative  Past Medical History  Diagnosis Date  . Diabetic neuropathy   . Cancer     History of Duodenal Cancer  . Chronic kidney disease     Pt is not eligible for transplant per [facility]  . Anemia     esrd  . Hypertension     meds d/c 'd  2 wks ago for postural hypotension  . Fatigue      tires easily x 2 months  . Chronic nausea     with  morning vomiting; better off  HTN meds  . Intermittent tremor   . Closed left arm fracture 1967  . Heart murmur   . Corneal abrasion, left   . Shortness of breath     exertional  . GERD (gastroesophageal reflux disease)   . History of blood transfusion   . Lack of coordination   . Muscle weakness (generalized)   . Glomerulosclerosis, diabetic   . Depression   . A-fib   . Critical lower limb ischemia   . SIRS (systemic inflammatory response syndrome) 04/02/2013  . Calciphylaxis 05/2013    lower ext  . Dysrhythmia     Afib  . Diabetes mellitus     Type 1 iddm x 11 yrs   Past Surgical History  Procedure Laterality Date  . Whipple procedure  2002    duodenal ca, Dr. Harlow Asa  . Bone marrow biopsy  2012  . Renal biopsy, percutaneous  2012  . Tonsillectomy    . Portacath placement  2002  . Biliary diversion, external  2002  . Other surgical history      Cyst removed from back  . Av  fistula placement  06/07/2011    Procedure: ARTERIOVENOUS (AV) FISTULA CREATION;  Surgeon: Angelia Mould, MD;  Location: [place];  Service: Vascular;  Laterality: Left;  . Ligation of arteriovenous  fistula  05/22/2012    Procedure: LIGATION OF ARTERIOVENOUS  FISTULA;  Surgeon: Mal Misty, MD;  Location: [place];  Service: Vascular;  Laterality: Left;  Left brachio-cephalic fistula  . Insertion of dialysis catheter  05/22/2012    Procedure: INSERTION OF DIALYSIS CATHETER;  Surgeon: Mal Misty, MD;  Location: [place];  Service: Vascular;  Laterality: N/A;  right internal jugular vein  . Av fistula placement  06/18/2012    Procedure: ARTERIOVENOUS (AV) FISTULA CREATION;  Surgeon: Mal Misty, MD;  Location: [place];  Service: Vascular;  Laterality: Right;  [place]  . Colonoscopy      Hx: of  . Capd insertion N/A 01/14/2013    Procedure: LAPAROSCOPIC INSERTION CONTINUOUS AMBULATORY PERITONEAL DIALYSIS  (CAPD) CATHETER;  Surgeon: Adin Hector, MD;  Location: [place];  Service: General;  Laterality: N/A;  . Vascular surgery    . Removal of a dialysis catheter  Social History:  reports that he has never smoked. He has never used smokeless tobacco. He reports that he does not drink alcohol or use illicit drugs.  Allergies  Allergen Reactions  . Metformin And Related Other (See Comments)    Kidney problems    Family History  Problem Relation Age of Onset  . Cancer Mother   . Cancer Father     prostate cancer  . Heart disease Maternal Grandfather   . Stroke Paternal Grandmother      Prior to Admission medications   Medication Sig Start Date End Date Taking? Authorizing Provider  acetaminophen (TYLENOL) 500 MG tablet Take 1,000 mg by mouth 2 (two) times daily as needed (pain).   Yes Historical Provider, MD  aspirin EC 81 MG tablet Take 1 tablet (81 mg total) by mouth daily. 06/11/13  Yes Daniel J Angiulli, PA-C  atorvastatin (LIPITOR) 10 MG tablet Take 1 tablet (10 mg total) by mouth  daily. 06/11/13  Yes Daniel J Angiulli, PA-C  collagenase (SANTYL) ointment Apply 1 application topically daily.   Yes Historical Provider, MD  gabapentin (NEURONTIN) 300 MG capsule Take 1 capsule (300 mg total) by mouth at bedtime. 06/11/13  Yes Daniel J Angiulli, PA-C  HYDROcodone-acetaminophen (NORCO/VICODIN) 5-325 MG per tablet Take 2 tablets by mouth 2 (two) times daily as needed for moderate pain.   Yes Historical Provider, MD  Insulin Glargine (LANTUS SOLOSTAR) 100 UNIT/ML Solostar Pen Inject 5-10 Units into the skin 2 (two) times daily. Inject 5 units every morning and 10 units every night   Yes Historical Provider, MD  multivitamin (RENA-VIT) TABS tablet Take 1 tablet by mouth at bedtime. 06/11/13  Yes Daniel J Angiulli, PA-C  nitroGLYCERIN (NITRODUR - DOSED IN MG/24 HR) 0.1 mg/hr patch Place 0.2 mg onto the skin daily. Leave on for 12 hours, then remove for 12 hours 07/02/13  Yes Historical Provider, MD  omeprazole (PRILOSEC) 40 MG capsule Take 1 capsule (40 mg total) by mouth daily. 06/11/13  Yes Daniel J Angiulli, PA-C  oxyCODONE-acetaminophen (PERCOCET/ROXICET) 5-325 MG per tablet Take 1-2 tablets by mouth every 4 (four) hours as needed (pain).  07/03/13  Yes Historical Provider, MD  sertraline (ZOLOFT) 50 MG tablet Take 50 mg by mouth daily.   Yes Historical Provider, MD  sevelamer carbonate (RENVELA) 800 MG tablet Take 2 tablets (1,600 mg total) by mouth 3 (three) times daily with meals. 06/11/13  Yes Daniel J Angiulli, PA-C  triamcinolone cream (KENALOG) 0.1 % Apply 1 application topically daily as needed (pain).  03/30/13  Yes Historical Provider, MD   Physical Exam: Filed Vitals:   07/09/13 1909  BP: 154/130  Pulse: 87  Temp:   Resp:     BP 154/130  Pulse 87  Temp(Src) 98 F (36.7 C) (Oral)  Resp 14  SpO2 100%  General Appearance:    Alert, oriented, no distress, appears stated age  Head:    Normocephalic, atraumatic  Eyes:    PERRL, EOMI, sclera non-icteric        Nose:    Nares without drainage or epistaxis. Mucosa, turbinates normal  Throat:   Moist mucous membranes. Oropharynx without erythema or exudate.  Neck:   Supple. No carotid bruits.  No thyromegaly.  No lymphadenopathy.   Back:     No CVA tenderness, no spinal tenderness  Lungs:     Clear to auscultation bilaterally, without wheezes, rhonchi or rales  Chest wall:    No tenderness to palpitation  Heart:  Regular rate and rhythm without murmurs, gallops, rubs  Abdomen:     Soft, non-tender, nondistended, normal bowel sounds, no organomegaly  Genitalia:    deferred  Rectal:    deferred  Extremities:   No clubbing, cyanosis or edema.  Pulses:   2+ and symmetric all extremities  Skin:   Skin color, texture, turgor normal, no rashes or lesions  Lymph nodes:   Cervical, supraclavicular, and axillary nodes normal  Neurologic:   CNII-XII intact. Normal strength, sensation and reflexes      throughout    Labs on Admission:  Basic Metabolic Panel:  Recent Labs Lab 07/09/13 1037 07/09/13 1701  NA 139 139  K 4.8 4.8  CL  --  96  CO2  --  23  GLUCOSE 117* 139*  BUN  --  39*  CREATININE  --  5.76*  CALCIUM  --  8.1*   Liver Function Tests: No results found for this basename: AST, ALT, ALKPHOS, BILITOT, PROT, ALBUMIN,  in the last 168 hours No results found for this basename: LIPASE, AMYLASE,  in the last 168 hours No results found for this basename: AMMONIA,  in the last 168 hours CBC:  Recent Labs Lab 07/09/13 1037 07/09/13 1759  WBC  --  19.3*  NEUTROABS  --  16.4*  HGB 15.0 10.1*  HCT 44.0 32.8*  MCV  --  95.1  PLT  --  333   Cardiac Enzymes: No results found for this basename: CKTOTAL, CKMB, CKMBINDEX, TROPONINI,  in the last 168 hours  BNP (last 3 results)  Recent Labs  04/02/13 1217  PROBNP 12548.0*   CBG:  Recent Labs Lab 07/09/13 1011 07/09/13 1334  GLUCAP 107* 101*    Radiological Exams on Admission: No results found.  EKG: Independently  reviewed.  Assessment/Plan Principal Problem:   Syncope Active Problems:   End stage renal disease   DM (diabetes mellitus)   1. Syncope - 2d echo ordered, tele monitor and serial trops ordered. 2. ESRD - called dialysis for tomorrow 3. DM - continue home lantus dose   Code Status: Full  Family Communication: Wife at bedside Disposition Plan: Admit to obs   Time spent: 50 min  Lamya Lausch M. [facility] Pager [phone]  If 7AM-7PM, please contact the day team taking care of the patient [email or web address] Password [facility] 07/09/2013, 7:31 PM

## 2013-07-09 NOTE — Op Note (Signed)
07/09/2013  1:26 PM  PATIENT:  Paul [name]  58 y.o. male  Patient Care Team: [name], MD as PCP - General (Family Medicine) Lucrezia Starch, MD (Nephrology) Ladell Pier, MD as Attending Physician (Internal Medicine) Angelia Mould, MD as Consulting Physician (Vascular Surgery)  PRE-OPERATIVE DIAGNOSIS:  Poor tolerance of peritoneal dialysis caterter  POST-OPERATIVE DIAGNOSIS:  Poor tolerance of peritoneal dialysis caterter  PROCEDURE:  Procedure(s): CONTINUOUS AMBULATORY PERITONEAL DIALYSIS  (CAPD) CATHETER REMOVAL  SURGEON:  Surgeon(s): Adin Hector, MD  ASSISTANT: RN   ANESTHESIA:   local and general  EBL:     Delay start of Pharmacological VTE agent (>24hrs) due to surgical blood loss or risk of bleeding:  no  DRAINS: none   SPECIMEN:  Source of Specimen:  CAPD catheter (not sent)  DISPOSITION OF SPECIMEN:  N/A  COUNTS:  YES  PLAN OF CARE: Discharge to home after PACU  PATIENT DISPOSITION:  PACU - hemodynamically stable.   INDICATION: Pleasant patient with chronic renal failure. Progressed to the point of needing dialysis. Had a CAPD peritoneal dialysis catheter placed in the past.  Not tolerating CAPD  Therefore, recommendation was made for removal. I did discuss procedure with the patient:   The anatomy & physiology of peritoneum was discussed. Natural history risks without surgery of worsening renal failure was discussed. I feel the risks of no intervention will lead to serious problems that outweigh the operative risks; therefore, I recommended repositioning / replaceement of a peritoneal dialysis catheter. I explained laparoscopic techniques with possible need for an open approach. Probable omentopexy.  Risks such as bleeding, infection, abscess, injury to other organs, reoperation, heart attack, death, and other risks were discussed. I noted a good likelihood this will help address the problem. Need for further treatment such as  hemodialysis was explained. Goals of post-operative recovery were discussed as well. We will work to minimize complications. Questions were answered. The patient expresses understanding & wishes to proceed with surgery.   OR FINDINGS: Intact cuffs. No evidence of cellulitis or abscess in subcutaneous tissues or in the deeper abdominal wall   DESCRIPTION:  Informed consent was confirmed. The patient received IV antibiotics. The patient underwent general anesthesia without any difficulty. The patient was positioned in low lithotomy.  SCDs were active during the entire case. The abdomen was prepped and draped in a sterile fashion. A surgical timeout confirmed our plan.   I made a transverse incision over CAPD catheter exit site. I entered in the subcutaneous tissues. I located the peritoneal dialysis catheter in the deep SQ space.  I freed the catheter is attachments to the abdominal wall in the subcutaneous smaller cuff. I came down to the anterior rectus fascia and opened that and freed around the cuff and removed the catheter and its intraperitoneal component intact.  Hemostasis ensured.  I closed the peritoneum & then the anterior rectus fascia using 0 vicryl  PDS interrupted sutures. I closed skin using 4 Monocryl stitch in running fashion. I made an elliptical incision around the catheter skin exit site and remove that intact. I closed that area with Monocryl. Sterile dressing applied. Patient is being extubated.   I discussed postop care in detail the patient in the holding area. Instructions are written. I am about to discuss with family as well.

## 2013-07-09 NOTE — Interval H&P Note (Signed)
History and Physical Interval Note:  07/09/2013 12:19 PM  Paul [name]  has presented today for surgery, with the diagnosis of Poor tolerance of peritonea dialysis caterter  The various methods of treatment have been discussed with the patient and family. After consideration of risks, benefits and other options for treatment, the patient has consented to  Procedure(s): CONTINUOUS AMBULATORY PERITONEAL DIALYSIS  (CAPD) CATHETER REMOVAL (N/A) as a surgical intervention .  The patient's history has been reviewed, patient examined, no change in status, stable for surgery.  I have reviewed the patient's chart and labs.  Questions were answered to the patient's satisfaction.     [name] C.

## 2013-07-09 NOTE — Discharge Instructions (Signed)
ABDOMINAL SURGERY: POST OP INSTRUCTIONS ° °1. DIET: Follow a light bland diet the first 24 hours after arrival home, such as soup, liquids, crackers, etc.  Be sure to include lots of fluids daily.  Avoid fast food or heavy meals as your are more likely to get nauseated.  Eat a low fat the next few days after surgery.   °2. Take your usually prescribed home medications unless otherwise directed. °3. PAIN CONTROL: °a. Pain is best controlled by a usual combination of three different methods TOGETHER: °i. Ice/Heat °ii. Over the counter pain medication °iii. Prescription pain medication °b. Most patients will experience some swelling and bruising around the incisions.  Ice packs or heating pads (30-60 minutes up to 6 times a day) will help. Use ice for the first few days to help decrease swelling and bruising, then switch to heat to help relax tight/sore spots and speed recovery.  Some people prefer to use ice alone, heat alone, alternating between ice & heat.  Experiment to what works for you.  Swelling and bruising can take several weeks to resolve.   °c. It is helpful to take an over-the-counter pain medication regularly for the first few weeks.  Choose one of the following that works best for you: °i. Naproxen (Aleve, etc)  Two 220mg tabs twice a day °ii. Ibuprofen (Advil, etc) Three 200mg tabs four times a day (every meal & bedtime) °iii. Acetaminophen (Tylenol, etc) 500-650mg four times a day (every meal & bedtime) °d. A  prescription for pain medication (such as oxycodone, hydrocodone, etc) should be given to you upon discharge.  Take your pain medication as prescribed.  °i. If you are having problems/concerns with the prescription medicine (does not control pain, nausea, vomiting, rash, itching, etc), please call us [phone] to see if we need to switch you to a different pain medicine that will work better for you and/or control your side effect better. °ii. If you need a refill on your pain medication,  please contact your pharmacy.  They will contact our office to request authorization. Prescriptions will not be filled after 5 pm or on week-ends. °4. Avoid getting constipated.  Between the surgery and the pain medications, it is common to experience some constipation.  Increasing fluid intake and taking a fiber supplement (such as Metamucil, Citrucel, FiberCon, MiraLax, etc) 1-2 times a day regularly will usually help prevent this problem from occurring.  A mild laxative (prune juice, Milk of Magnesia, MiraLax, etc) should be taken according to package directions if there are no bowel movements after 48 hours.   °5. Watch out for diarrhea.  If you have many loose bowel movements, simplify your diet to bland foods & liquids for a few days.  Stop any stool softeners and decrease your fiber supplement.  Switching to mild anti-diarrheal medications (Kayopectate, Pepto Bismol) can help.  If this worsens or does not improve, please call us. °6. Wash / shower every day.  You may shower over the incision / wound.  Avoid baths until the skin is fully healed.  Continue to shower over incision(s) after the dressing is off. °7. Remove your waterproof bandages 5 days after surgery.  You may leave the incision open to air.  You may replace a dressing/Band-Aid to cover the incision for comfort if you wish. °8. ACTIVITIES as tolerated:   °a. You may resume regular (light) daily activities beginning the next day--such as daily self-care, walking, climbing stairs--gradually increasing activities as tolerated.  If you can   walk 30 minutes without difficulty, it is safe to try more intense activity such as jogging, treadmill, bicycling, low-impact aerobics, swimming, etc. °b. Save the most intensive and strenuous activity for last such as sit-ups, heavy lifting, contact sports, etc  Refrain from any heavy lifting or straining until you are off narcotics for pain control.   °c. DO NOT PUSH THROUGH PAIN.  Let pain be your guide: If it  hurts to do something, don't do it.  Pain is your body warning you to avoid that activity for another week until the pain goes down. °d. You may drive when you are no longer taking prescription pain medication, you can comfortably wear a seatbelt, and you can safely maneuver your car and apply brakes. °e. You may have sexual intercourse when it is comfortable.  °9. FOLLOW UP in our office °a. Please call [facility] at [phone] to set up an appointment to see your surgeon in the office for a follow-up appointment approximately 1-2 weeks after your surgery. °b. Make sure that you call for this appointment the day you arrive home to insure a convenient appointment time. °10. IF YOU HAVE DISABILITY OR FAMILY LEAVE FORMS, BRING THEM TO THE OFFICE FOR PROCESSING.  DO NOT GIVE THEM TO YOUR DOCTOR. ° ° °WHEN TO CALL US [phone]: °1. Poor pain control °2. Reactions / problems with new medications (rash/itching, nausea, etc)  °3. Fever over 101.5 F (38.5 C) °4. Inability to urinate °5. Nausea and/or vomiting °6. Worsening swelling or bruising °7. Continued bleeding from incision. °8. Increased pain, redness, or drainage from the incision ° °The clinic staff is available to answer your questions during regular business hours (8:30am-5pm).  Please don’t hesitate to call and ask to speak to one of our nurses for clinical concerns.   A surgeon from [facility] is always on call at the hospitals °  °If you have a medical emergency, go to the nearest emergency room or call 911. °  ° °[facility] °[address], [address], [place], [place]  [address] ? °MAIN: [phone] ? TOLL FREE: [phone] ? °FAX [phone] °[email or web address] ° °

## 2013-07-09 NOTE — ED Notes (Signed)
Communicated with IV RN. Will come down soon.

## 2013-07-09 NOTE — ED Notes (Signed)
IV RN paged.

## 2013-07-09 NOTE — ED Provider Notes (Signed)
CSN: [id number]     Arrival date & time 07/09/13  58 History   First MD Initiated Contact with Patient 07/09/13 1628     Chief Complaint  Patient presents with  . Loss of Consciousness   Patient is a 58 y.o. male presenting with syncope.  Loss of Consciousness Episode history:  Single Most recent episode:  Today Duration:  2 minutes Timing:  Constant Progression:  Resolved Context: standing up   Context: not blood draw, not bowel movement, not dehydration and not exertion   Witnessed: yes   Relieved by:  None tried Worsened by:  Posture Ineffective treatments:  None tried Associated symptoms: no chest pain, no confusion, no dizziness, no fever, no headaches, no nausea, no palpitations, no shortness of breath and no vomiting     Past Medical History  Diagnosis Date  . Diabetic neuropathy   . Cancer     History of Duodenal Cancer  . Chronic kidney disease     Pt is not eligible for transplant per [facility]  . Anemia     esrd  . Hypertension     meds d/c 'd  2 wks ago for postural hypotension  . Fatigue      tires easily x 2 months  . Chronic nausea     with  morning vomiting; better off  HTN meds  . Intermittent tremor   . Closed left arm fracture 1967  . Heart murmur   . Corneal abrasion, left   . Shortness of breath     exertional  . GERD (gastroesophageal reflux disease)   . History of blood transfusion   . Lack of coordination   . Muscle weakness (generalized)   . Glomerulosclerosis, diabetic   . Depression   . A-fib   . Critical lower limb ischemia   . SIRS (systemic inflammatory response syndrome) 04/02/2013  . Calciphylaxis 05/2013    lower ext  . Dysrhythmia     Afib  . Diabetes mellitus     Type 1 iddm x 11 yrs   Past Surgical History  Procedure Laterality Date  . Whipple procedure  2002    duodenal ca, Dr. Harlow Asa  . Bone marrow biopsy  2012  . Renal biopsy, percutaneous  2012  . Tonsillectomy    . Portacath placement  2002  . Biliary diversion,  external  2002  . Other surgical history      Cyst removed from back  . Av fistula placement  06/07/2011    Procedure: ARTERIOVENOUS (AV) FISTULA CREATION;  Surgeon: Angelia Mould, MD;  Location: [place];  Service: Vascular;  Laterality: Left;  . Ligation of arteriovenous  fistula  05/22/2012    Procedure: LIGATION OF ARTERIOVENOUS  FISTULA;  Surgeon: Mal Misty, MD;  Location: [place];  Service: Vascular;  Laterality: Left;  Left brachio-cephalic fistula  . Insertion of dialysis catheter  05/22/2012    Procedure: INSERTION OF DIALYSIS CATHETER;  Surgeon: Mal Misty, MD;  Location: [place];  Service: Vascular;  Laterality: N/A;  right internal jugular vein  . Av fistula placement  06/18/2012    Procedure: ARTERIOVENOUS (AV) FISTULA CREATION;  Surgeon: Mal Misty, MD;  Location: [place];  Service: Vascular;  Laterality: Right;  [place]  . Colonoscopy      Hx: of  . Capd insertion N/A 01/14/2013    Procedure: LAPAROSCOPIC INSERTION CONTINUOUS AMBULATORY PERITONEAL DIALYSIS  (CAPD) CATHETER;  Surgeon: Adin Hector, MD;  Location: [place];  Service: General;  Laterality: N/A;  . Vascular surgery    . Removal of a dialysis catheter     Family History  Problem Relation Age of Onset  . Cancer Mother   . Cancer Father     prostate cancer  . Heart disease Maternal Grandfather   . Stroke Paternal Grandmother    History  Substance Use Topics  . Smoking status: Never Smoker   . Smokeless tobacco: Never Used  . Alcohol Use: No    Review of Systems  Constitutional: Negative for fever, activity change and appetite change.  HENT: Negative for congestion, ear pain, rhinorrhea, sinus pressure and sore throat.   Eyes: Negative for pain and redness.  Respiratory: Negative for cough, chest tightness and shortness of breath.   Cardiovascular: Positive for syncope. Negative for chest pain and palpitations.  Gastrointestinal: Negative for nausea, vomiting, abdominal pain, diarrhea and abdominal  distention.  Genitourinary: Negative for dysuria, flank pain and difficulty urinating.  Musculoskeletal: Negative for back pain, neck pain and neck stiffness.  Skin: Negative for rash and wound.  Neurological: Positive for syncope. Negative for dizziness, light-headedness, numbness and headaches.  Hematological: Negative for adenopathy.  Psychiatric/Behavioral: Negative for behavioral problems, confusion and agitation.      Allergies  Metformin and related  Home Medications   Current Outpatient Rx  Name  Route  Sig  Dispense  Refill  . acetaminophen (TYLENOL) 500 MG tablet   Oral   Take 1,000 mg by mouth 2 (two) times daily as needed (pain).         [name] aspirin EC 81 MG tablet   Oral   Take 1 tablet (81 mg total) by mouth daily.         [name] atorvastatin (LIPITOR) 10 MG tablet   Oral   Take 1 tablet (10 mg total) by mouth daily.   30 tablet   1   . collagenase (SANTYL) ointment   Topical   Apply 1 application topically daily.         [name] gabapentin (NEURONTIN) 300 MG capsule   Oral   Take 1 capsule (300 mg total) by mouth at bedtime.   30 capsule   1   . HYDROcodone-acetaminophen (NORCO/VICODIN) 5-325 MG per tablet   Oral   Take 2 tablets by mouth 2 (two) times daily as needed for moderate pain.         . Insulin Glargine (LANTUS SOLOSTAR) 100 UNIT/ML Solostar Pen   Subcutaneous   Inject 5-10 Units into the skin 2 (two) times daily. Inject 5 units every morning and 10 units every night         . multivitamin (RENA-VIT) TABS tablet   Oral   Take 1 tablet by mouth at bedtime.   30 tablet   0   . nitroGLYCERIN (NITRODUR - DOSED IN MG/24 HR) 0.1 mg/hr patch   Transdermal   Place 0.2 mg onto the skin daily. Leave on for 12 hours, then remove for 12 hours         . omeprazole (PRILOSEC) 40 MG capsule   Oral   Take 1 capsule (40 mg total) by mouth daily.   30 capsule   1   . oxyCODONE-acetaminophen (PERCOCET/ROXICET) 5-325 MG per tablet   Oral   Take  1-2 tablets by mouth every 4 (four) hours as needed (pain).          [name] sertraline (ZOLOFT) 50 MG tablet   Oral   Take 50 mg by mouth daily.         [name]  sevelamer carbonate (RENVELA) 800 MG tablet   Oral   Take 2 tablets (1,600 mg total) by mouth 3 (three) times daily with meals.   90 tablet   1   . triamcinolone cream (KENALOG) 0.1 %   Topical   Apply 1 application topically daily as needed (pain).           BP 154/130  Pulse 87  Temp(Src) 98 F (36.7 C) (Oral)  Resp 14  SpO2 100% Physical Exam  Constitutional: He is oriented to person, place, and time. He appears well-developed and well-nourished. No distress.  HENT:  Head: Normocephalic and atraumatic.  Nose: Nose normal.  Mouth/Throat: Oropharynx is clear and moist.  Eyes: Conjunctivae and EOM are normal. Pupils are equal, round, and reactive to light.  Neck: Normal range of motion. Neck supple. No tracheal deviation present.  Cardiovascular: Normal rate, regular rhythm, normal heart sounds and intact distal pulses.   Pulmonary/Chest: Effort normal and breath sounds normal. No respiratory distress. He has no rales.  Abdominal: Soft. Bowel sounds are normal. He exhibits no distension. There is tenderness ( over catherter removal site). There is no rebound and no guarding.  Musculoskeletal: Normal range of motion. He exhibits tenderness ( L shoulder TTP). He exhibits no edema.  Neurological: He is alert and oriented to person, place, and time.  Skin: Skin is warm and dry. Rash noted. There is erythema.  Gangrenous toes to blt feet with erythema and warmth. Per wife and patient this is baseline. Followed by wound clinic- hx of calciphylaxis.   Psychiatric: He has a normal mood and affect. His behavior is normal.    ED Course  Procedures (including critical care time) Labs Review Labs Reviewed  CBC WITH DIFFERENTIAL - Abnormal; Notable for the following:    WBC 19.3 (*)    RBC 3.45 (*)    Hemoglobin 10.1 (*)    HCT  32.8 (*)    RDW 15.9 (*)    Neutrophils Relative % 85 (*)    Neutro Abs 16.4 (*)    Lymphocytes Relative 6 (*)    Monocytes Absolute 1.6 (*)    All other components within normal limits  BASIC METABOLIC PANEL - Abnormal; Notable for the following:    Glucose, Bld 139 (*)    BUN 39 (*)    Creatinine, Ser 5.76 (*)    Calcium 8.1 (*)    GFR calc non Af Amer 10 (*)    GFR calc Af Amer 11 (*)    All other components within normal limits  URINALYSIS, ROUTINE W REFLEX MICROSCOPIC - Abnormal; Notable for the following:    Glucose, UA 100 (*)    Hgb urine dipstick MODERATE (*)    Protein, ur 30 (*)    All other components within normal limits  URINE MICROSCOPIC-ADD ON  CBC  BASIC METABOLIC PANEL  I-STAT TROPOININ, ED  Randolm Idol, ED    MDM   Final diagnoses:  Syncope    58 yo M in NAD AFVSS who presents after a syncopal episode. Patient was riding home in passenger seat after having his peritoneal dialysis catheter removed this morning. When he went to get out of the car (stood up) he had LOC. He denies any palpitations, CP, ABD pain, HA, or SOB prior to the event. He states he has been npo since last night and felt weak. His wife witnessed the LOC and caught him preventing him from falling. When EMS arrived he was 98/76. FSBS 115. His  wife denies witnessed any seizure like activity.   Patient denies HA prior to or currently. Neuro exam non focal . Doubt ICH. No witnessed seizure activity. He denies abd pain. Pulses symmetric blt. No pulsatile mass doubt AAA or thoracic aortic dissection.   7:58 PM New flattening of T waves in lateral leads. Not low risk per [facility] syncope rule. Plan on observation period. Patient received NS hydration in ED. Hospitalist team consulted.   Patient admitted for further care.   Case discussed with my attending Dr. Stevie Kern.   Ruthell Rummage, MD 07/09/13 [phone]

## 2013-07-09 NOTE — ED Notes (Signed)
IV RN at bedside 

## 2013-07-09 NOTE — ED Notes (Signed)
Patient transported to X-ray 

## 2013-07-09 NOTE — H&P (View-Only) (Signed)
Subjective:     Patient ID: Paul [name], male   DOB: [birth year], 58 y.o.   MRN: [id number]  HPI  Note: This dictation was prepared with Dragon/digital dictation along with [facility] technology. Any transcriptional errors that result from this process are unintentional.       BECKETT MADEN  1955-12-31 [id number]  Patient Care Team: [name], MD as PCP - General (Family Medicine) Lucrezia Starch, MD (Nephrology) Ladell Pier, MD as Attending Physician (Internal Medicine) Angelia Mould, MD as Consulting Physician (Vascular Surgery)  Procedure (Date: 01/14/2013):  Diagnostic laparoscopy and placement of peritoneal dialysis catheter  This patient returns for surgical re-evaluation.  He has had issues with peritoneal dialysis and wishes to have the catheter removed.  The catheter functions fine.  However, he has had some episodes of weakness and falls at home.  He is concerned it may be related to that.  He also notes it is a lot more of a hassle to do the peritoneal dialysis with all the fluid bags.  It has been inconvenient that he thought it would be.  He is back to hemodialysis.  He seemed to tolerate that better now.  He has had worsening issues with foot ulcers and poor blood flow being treated with nitroglycerin patches and vascular and wound surgical care.  Troubles with depression but seems to be managing it better with better support and antidepressants.  Patient Active Problem List   Diagnosis Date Noted  . Intolerance to CAPD peritoneal dialysis  07/05/2013  . Atherosclerosis of native arteries of the extremities with ulceration(440.23) 06/17/2013  . Gait disorder 05/31/2013  . Weakness 05/24/2013  . Hypokalemia 04/04/2013  . Depression 04/03/2013  . GERD (gastroesophageal reflux disease) 04/03/2013  . Atrial fibrillation 04/02/2013  . Hypotension 04/02/2013  . DM (diabetes mellitus) 12/28/2012  . Other complications due to renal dialysis device, implant,  and graft 05/19/2012  . Anemia 07/02/2011  . End stage renal disease 05/29/2011    Past Medical History  Diagnosis Date  . Diabetic neuropathy   . Cancer     History of Duodenal Cancer  . Chronic kidney disease     Pt is not eligible for transplant per [facility]  . Diabetes mellitus     Type 2 iddm x 11 yrs  . Anemia     esrd  . Hypertension     meds d/c 'd  2 wks ago for postural hypotension  . Fatigue      tires easily x 2 months  . Chronic nausea     with  morning vomiting; better off  HTN meds  . Intermittent tremor   . Closed left arm fracture 1967  . Heart murmur   . Corneal abrasion, left   . Shortness of breath     exertional  . GERD (gastroesophageal reflux disease)   . History of blood transfusion   . Lack of coordination   . Muscle weakness (generalized)   . Glomerulosclerosis, diabetic   . Depression   . A-fib   . Critical lower limb ischemia   . SIRS (systemic inflammatory response syndrome) 04/02/2013    Past Surgical History  Procedure Laterality Date  . Whipple procedure  2002    duodenal ca, Dr. Harlow Asa  . Bone marrow biopsy  2012  . Renal biopsy, percutaneous  2012  . Tonsillectomy    . Portacath placement  2002  . Biliary diversion, external  2002  . Other surgical history  Cyst removed from back  . Av fistula placement  06/07/2011    Procedure: ARTERIOVENOUS (AV) FISTULA CREATION;  Surgeon: Angelia Mould, MD;  Location: [place];  Service: Vascular;  Laterality: Left;  . Ligation of arteriovenous  fistula  05/22/2012    Procedure: LIGATION OF ARTERIOVENOUS  FISTULA;  Surgeon: Mal Misty, MD;  Location: [place];  Service: Vascular;  Laterality: Left;  Left brachio-cephalic fistula  . Insertion of dialysis catheter  05/22/2012    Procedure: INSERTION OF DIALYSIS CATHETER;  Surgeon: Mal Misty, MD;  Location: [place];  Service: Vascular;  Laterality: N/A;  right internal jugular vein  . Av fistula placement  06/18/2012    Procedure:  ARTERIOVENOUS (AV) FISTULA CREATION;  Surgeon: Mal Misty, MD;  Location: [place];  Service: Vascular;  Laterality: Right;  [place]  . Colonoscopy      Hx: of  . Capd insertion N/A 01/14/2013    Procedure: LAPAROSCOPIC INSERTION CONTINUOUS AMBULATORY PERITONEAL DIALYSIS  (CAPD) CATHETER;  Surgeon: Adin Hector, MD;  Location: [place];  Service: General;  Laterality: N/A;  . Vascular surgery      History   Social History  . Marital Status: Married    Spouse Name: N/A    Number of Children: N/A  . Years of Education: N/A   Occupational History  . Not on file.   Social History Main Topics  . Smoking status: Never Smoker   . Smokeless tobacco: Never Used  . Alcohol Use: No  . Drug Use: No  . Sexual Activity: Not on file   Other Topics Concern  . Not on file   Social History Narrative  . No narrative on file    Family History  Problem Relation Age of Onset  . Cancer Mother   . Cancer Father     prostate cancer  . Heart disease Maternal Grandfather   . Stroke Paternal Grandmother     Current Outpatient Prescriptions  Medication Sig Dispense Refill  . aspirin EC 81 MG tablet Take 1 tablet (81 mg total) by mouth daily.      [name] atorvastatin (LIPITOR) 10 MG tablet Take 1 tablet (10 mg total) by mouth daily.  30 tablet  1  . darbepoetin (ARANESP) 200 MCG/0.4ML SOLN injection Inject 200 mcg into the skin.      . feeding supplement, RESOURCE BREEZE, (RESOURCE BREEZE) LIQD Take 1 Container by mouth daily.      [name] gabapentin (NEURONTIN) 300 MG capsule Take 1 capsule (300 mg total) by mouth at bedtime.  30 capsule  1  . HYDROcodone-acetaminophen (NORCO/VICODIN) 5-325 MG per tablet Take 1-2 tablets by mouth every 4 (four) hours as needed for moderate pain.  60 tablet  0  . Insulin Glargine (LANTUS SOLOSTAR) 100 UNIT/ML Solostar Pen Inject 5-10 Units into the skin 2 (two) times daily. Inject 5 units every morning and 10 units every night      . midodrine (PROAMATINE) 10 MG tablet Take  1 tablet (10 mg total) by mouth 3 (three) times daily with meals.  90 tablet  1  . multivitamin (RENA-VIT) TABS tablet Take 1 tablet by mouth at bedtime.  30 tablet  0  . omeprazole (PRILOSEC) 40 MG capsule Take 1 capsule (40 mg total) by mouth daily.  30 capsule  1  . sertraline (ZOLOFT) 100 MG tablet Take 1 tablet (100 mg total) by mouth daily.  30 tablet  1  . sevelamer carbonate (RENVELA) 800 MG tablet Take  2 tablets (1,600 mg total) by mouth 3 (three) times daily with meals.  90 tablet  1   No current facility-administered medications for this visit.     Allergies  Allergen Reactions  . Metformin And Related Other (See Comments)    Kidney problems    BP 132/78  Pulse 110  Temp(Src) 98.6 F (37 C) (Oral)  Resp 24  Ht $R'6\' 2"'vr$  (1.88 m)  Wt 185 lb 3.2 oz (84.006 kg)  BMI 23.77 kg/m2  No results found.   Review of Systems  Constitutional: Positive for fatigue. Negative for fever, chills and diaphoresis.  HENT: Negative for sore throat and trouble swallowing.   Eyes: Negative for photophobia and visual disturbance.  Respiratory: Negative for choking and shortness of breath.   Cardiovascular: Negative for chest pain and palpitations.  Gastrointestinal: Negative for nausea, vomiting, diarrhea, constipation, blood in stool, abdominal distention, anal bleeding and rectal pain.  Genitourinary: Negative for dysuria, urgency, difficulty urinating and testicular pain.  Musculoskeletal: Positive for arthralgias, gait problem and myalgias. Negative for neck pain.  Skin: Positive for wound. Negative for color change and rash.  Neurological: Positive for weakness and light-headedness. Negative for dizziness, speech difficulty and numbness.  Hematological: Negative for adenopathy.  Psychiatric/Behavioral: Positive for dysphoric mood. Negative for suicidal ideas, hallucinations, confusion, self-injury and agitation.       Objective:   Physical Exam  Constitutional: He is oriented to  person, place, and time. He appears well-developed and well-nourished. No distress.  HENT:  Head: Normocephalic.  Mouth/Throat: Oropharynx is clear and moist. No oropharyngeal exudate.  Eyes: Conjunctivae and EOM are normal. Pupils are equal, round, and reactive to light. No scleral icterus.  Neck: Normal range of motion. No tracheal deviation present.  Cardiovascular: Normal rate, normal heart sounds and intact distal pulses.   Pulmonary/Chest: Effort normal. No respiratory distress.  Abdominal: Soft. He exhibits no distension. There is no tenderness. Hernia confirmed negative in the right inguinal area and confirmed negative in the left inguinal area.    Incisions clean with normal healing ridges.  No hernias  Musculoskeletal: Normal range of motion. He exhibits no tenderness.  Chronic wound bilateral feet.  Bandages clean  Neurological: He is alert and oriented to person, place, and time. No cranial nerve deficit. He exhibits normal muscle tone. Coordination normal.  Skin: Skin is warm and dry. No rash noted. He is not diaphoretic.  Psychiatric: He has a normal mood and affect. His behavior is normal.       Assessment:     Poor tolerance of peritoneal dialysis, wishing removal of CAPD catheter  Plan:     Per his wishes, we will remove it.  This will require LMA/sedation.  Plan to do an on and off dialysis today.  He does take a baby aspirin.  Given his significant peripheral vascular disease, continue that for now since risk of bleeding that bad with just 81 a day.  The anatomy & physiology of peritoneum was discussed.  Natural history risks without surgery of worsening renal failure was discussed.   I feel the risks of no intervention will lead to serious problems that outweigh the operative risks; therefore, I recommended removal of the CAPD peritoneal dialysis catheter.  I techniques with probable open approach.   Risks such as bleeding, infection, abscess, injury to other organs,  reoperation heart attack, death, and other risks were discussed.   I noted a good likelihood this will help address the problem.  Possibility that this will  not be enough to compensate for the renal failure & need for further treatment such as hemodialysis was explained.  Goals of post-operative recovery were discussed as well.  We will work to minimize complications.  Questions were answered.  The patient expresses understanding & wishes to proceed with surgery.

## 2013-07-09 NOTE — ED Provider Notes (Signed)
Muse interface not working ECG: Sinus rhythm, ventricular rate 89, normal axis, septal Q waves, nonspecific T wave abnormality lateral leads, no comparison ECG immediately available  I saw and evaluated the patient, reviewed the resident's note and I agree with the findings and plan.     Hemodialysis Pt had unused peritoneal catheter removed today, patient had syncope without prodrome when getting out of his truck at home after procedure today, abdomen and now has minimal tenderness at the rule site of the peritoneal dialysis catheter only the rest of the abdomen is nontender; he had no headache before or after his fainting spell no head injury new focal neurologic symptoms; Pt helped to the ground by his wife.  Babette Relic, MD 07/10/13 [phone]

## 2013-07-09 NOTE — ED Notes (Signed)
MD at bedside. 

## 2013-07-09 NOTE — Anesthesia Preprocedure Evaluation (Signed)
Anesthesia Evaluation  Patient identified by MRN, date of birth, ID band Patient awake    Reviewed: Allergy & Precautions, H&P , NPO status , Patient's Chart, lab work & pertinent test results  Airway Mallampati: II  Neck ROM: full    Dental   Pulmonary shortness of breath,          Cardiovascular hypertension, + Peripheral Vascular Disease + dysrhythmias Atrial Fibrillation     Neuro/Psych Depression    GI/Hepatic GERD-  ,H/o whipple procedure.   Endo/Other  diabetes, Type 2  Renal/GU ESRFRenal disease     Musculoskeletal   Abdominal   Peds  Hematology   Anesthesia Other Findings   Reproductive/Obstetrics                           Anesthesia Physical Anesthesia Plan  ASA: III  Anesthesia Plan: General   Post-op Pain Management:    Induction: Intravenous  Airway Management Planned: LMA  Additional Equipment:   Intra-op Plan:   Post-operative Plan:   Informed Consent: I have reviewed the patients History and Physical, chart, labs and discussed the procedure including the risks, benefits and alternatives for the proposed anesthesia with the patient or authorized representative who has indicated his/her understanding and acceptance.     Plan Discussed with: CRNA, Anesthesiologist and Surgeon  Anesthesia Plan Comments:         Anesthesia Quick Evaluation

## 2013-07-09 NOTE — ED Notes (Signed)
58 yo male with syncopal episode after getting out of the car lasting 1 min per wife, then again with the FDP lasting 1 min. This am pt had peritoneal dialysis cath removed from lower abd at 10am. HX of DM, CKD dialysis T,TH,SAT. Pt did not hit head but was eased down on to the floor. [facility] first bp 98/76 then 122/78 97% on 4L. CBG 115.

## 2013-07-09 NOTE — Anesthesia Postprocedure Evaluation (Signed)
  Anesthesia Post-op Note  Patient: Paul [name]  Procedure(s) Performed: Procedure(s): CONTINUOUS AMBULATORY PERITONEAL DIALYSIS  (CAPD) CATHETER REMOVAL (N/A)  Patient Location: PACU  Anesthesia Type:General  Level of Consciousness: awake and alert   Airway and Oxygen Therapy: Patient Spontanous Breathing  Post-op Pain: none  Post-op Assessment: Post-op Vital signs reviewed, Patient's Cardiovascular Status Stable and Respiratory Function Stable  Post-op Vital Signs: Reviewed  Filed Vitals:   07/09/13 1358  BP:   Pulse:   Temp: 36.7 C  Resp:     Complications: No apparent anesthesia complications

## 2013-07-09 NOTE — Preoperative (Signed)
Beta Blockers   Reason not to administer Beta Blockers:Not Applicable 

## 2013-07-10 ENCOUNTER — Encounter (HOSPITAL_COMMUNITY): Payer: Self-pay | Admitting: *Deleted

## 2013-07-10 LAB — CBC
HCT: 27.8 % — ABNORMAL LOW (ref 39.0–52.0)
HCT: 26.4 % — ABNORMAL LOW (ref 39.0–52.0)
Hemoglobin: 8.5 g/dL — ABNORMAL LOW (ref 13.0–17.0)
Hemoglobin: 8.2 g/dL — ABNORMAL LOW (ref 13.0–17.0)
MCH: 29.1 pg (ref 26.0–34.0)
MCH: 29.2 pg (ref 26.0–34.0)
MCHC: 30.6 g/dL (ref 30.0–36.0)
MCHC: 31.1 g/dL (ref 30.0–36.0)
MCV: 95.2 fL (ref 78.0–100.0)
MCV: 94 fL (ref 78.0–100.0)
PLATELETS: 305 10*3/uL (ref 150–400)
PLATELETS: 275 10*3/uL (ref 150–400)
RBC: 2.92 MIL/uL — ABNORMAL LOW (ref 4.22–5.81)
RBC: 2.81 MIL/uL — AB (ref 4.22–5.81)
RDW: 15.8 % — AB (ref 11.5–15.5)
RDW: 15.8 % — AB (ref 11.5–15.5)
WBC: 18 10*3/uL — ABNORMAL HIGH (ref 4.0–10.5)
WBC: 10.3 10*3/uL (ref 4.0–10.5)

## 2013-07-10 LAB — BASIC METABOLIC PANEL
BUN: 47 mg/dL — ABNORMAL HIGH (ref 6–23)
CALCIUM: 7.7 mg/dL — AB (ref 8.4–10.5)
CO2: 27 mEq/L (ref 19–32)
Chloride: 98 mEq/L (ref 96–112)
Creatinine, Ser: 6.67 mg/dL — ABNORMAL HIGH (ref 0.50–1.35)
GFR calc non Af Amer: 8 mL/min — ABNORMAL LOW (ref >90)
GFR, EST AFRICAN AMERICAN: 9 mL/min — AB (ref >90)
Glucose, Bld: 208 mg/dL — ABNORMAL HIGH (ref 70–99)
Potassium: 4.2 mEq/L (ref 3.7–5.3)
Sodium: 141 mEq/L (ref 137–147)

## 2013-07-10 LAB — PREPARE RBC (CROSSMATCH)

## 2013-07-10 LAB — TROPONIN I: Troponin I: <0.3 ng/mL (ref <0.30)

## 2013-07-10 LAB — GLUCOSE, CAPILLARY
Glucose-Capillary: 166 mg/dL — ABNORMAL HIGH (ref 70–99)
Glucose-Capillary: 178 mg/dL — ABNORMAL HIGH (ref 70–99)
Glucose-Capillary: 147 mg/dL — ABNORMAL HIGH (ref 70–99)
Glucose-Capillary: 241 mg/dL — ABNORMAL HIGH (ref 70–99)

## 2013-07-10 MED ORDER — SODIUM THIOSULFATE 25 % IV SOLN
25.0000 g | INTRAVENOUS | Status: DC
  Filled 2013-07-10 (×2): qty 100

## 2013-07-10 MED ORDER — DARBEPOETIN ALFA-POLYSORBATE 150 MCG/0.3ML IJ SOLN
150.0000 ug | INTRAMUSCULAR | Status: DC
  Administered 2013-07-10: 150 ug via INTRAVENOUS
  Filled 2013-07-10: qty 0.3

## 2013-07-10 MED ORDER — INSULIN ASPART 100 UNIT/ML ~~LOC~~ SOLN
0.0000 [IU] | Freq: Three times a day (TID) | SUBCUTANEOUS | Status: DC
  Administered 2013-07-10: 2 [IU] via SUBCUTANEOUS
  Administered 2013-07-10: 3 [IU] via SUBCUTANEOUS
  Administered 2013-07-11 – 2013-07-13 (×5): 2 [IU] via SUBCUTANEOUS

## 2013-07-10 MED ORDER — DARBEPOETIN ALFA-POLYSORBATE 150 MCG/0.3ML IJ SOLN
INTRAMUSCULAR | Status: AC
  Administered 2013-07-10: 150 ug via INTRAVENOUS
  Filled 2013-07-10: qty 0.3

## 2013-07-10 MED ORDER — NITROGLYCERIN 0.1 MG/HR TD PT24
0.2000 mg | MEDICATED_PATCH | TRANSDERMAL | Status: DC
  Administered 2013-07-10: 0.2 mg via TRANSDERMAL
  Filled 2013-07-10 (×3): qty 2

## 2013-07-10 NOTE — Consult Note (Signed)
[facility] Renal Consultation Note    Indication for Consultation:  Management of ESRD/hemodialysis; anemia, hypertension/volume and secondary hyperparathyroidism  HPI: Paul [name] is a 58 y.o. male ESRD patient with multiple medical problems who was recently transitioned from PD to hemodialysis secondary to poor treatment tolerance .  He presented to [facility] yesterday for removal of his PD catheter and experienced multiple syncopal episodes on the way home. He was mildly hypotensive upon arrival and responded favorable to fluid bolus in the ED.  He has history of orthostatic hypotension as well as calciphylaxis/ wounds to bilateral lower extremities. He was recently been taken off midodrine as it was thought to be causing vasoconstriction and inhibiting wound healing. At the time of this encounter, he is resting comfortably and without emerging complaints.  He states that he has been maintaining his pressures at home and during dialysis since off midodrine and denies any other episodes of syncope. Syncope work-up is in progress. He is scheduled for hemodialysis later today.  History is taken from patient and outpatient notes.  Past Medical History  Diagnosis Date  . Diabetic neuropathy   . Cancer     History of Duodenal Cancer  . Chronic kidney disease     Pt is not eligible for transplant per [facility]  . Anemia     esrd  . Hypertension     meds d/c 'd  2 wks ago for postural hypotension  . Fatigue      tires easily x 2 months  . Chronic nausea     with  morning vomiting; better off  HTN meds  . Intermittent tremor   . Closed left arm fracture 1967  . Heart murmur   . Corneal abrasion, left   . Shortness of breath     exertional  . GERD (gastroesophageal reflux disease)   . History of blood transfusion   . Lack of coordination   . Muscle weakness (generalized)   . Glomerulosclerosis, diabetic   . Depression   . A-fib   . Critical lower limb ischemia   . SIRS (systemic  inflammatory response syndrome) 04/02/2013  . Calciphylaxis 05/2013    lower ext  . Dysrhythmia     Afib  . Diabetes mellitus     Type 1 iddm x 11 yrs   Past Surgical History  Procedure Laterality Date  . Whipple procedure  2002    duodenal ca, Dr. Harlow Asa  . Bone marrow biopsy  2012  . Renal biopsy, percutaneous  2012  . Tonsillectomy    . Portacath placement  2002  . Biliary diversion, external  2002  . Other surgical history      Cyst removed from back  . Av fistula placement  06/07/2011    Procedure: ARTERIOVENOUS (AV) FISTULA CREATION;  Surgeon: Angelia Mould, MD;  Location: [place];  Service: Vascular;  Laterality: Left;  . Ligation of arteriovenous  fistula  05/22/2012    Procedure: LIGATION OF ARTERIOVENOUS  FISTULA;  Surgeon: Mal Misty, MD;  Location: [place];  Service: Vascular;  Laterality: Left;  Left brachio-cephalic fistula  . Insertion of dialysis catheter  05/22/2012    Procedure: INSERTION OF DIALYSIS CATHETER;  Surgeon: Mal Misty, MD;  Location: [place];  Service: Vascular;  Laterality: N/A;  right internal jugular vein  . Av fistula placement  06/18/2012    Procedure: ARTERIOVENOUS (AV) FISTULA CREATION;  Surgeon: Mal Misty, MD;  Location: [place];  Service: Vascular;  Laterality:  Right;  CIMINO  . Colonoscopy      Hx: of  . Capd insertion N/A 01/14/2013    Procedure: LAPAROSCOPIC INSERTION CONTINUOUS AMBULATORY PERITONEAL DIALYSIS  (CAPD) CATHETER;  Surgeon: Adin Hector, MD;  Location: [place];  Service: General;  Laterality: N/A;  . Vascular surgery    . Removal of a dialysis catheter     Family History  Problem Relation Age of Onset  . Cancer Mother   . Cancer Father     prostate cancer  . Heart disease Maternal Grandfather   . Stroke Paternal Grandmother    Social History:  reports that he has never smoked. He has never used smokeless tobacco. He reports that he does not drink alcohol or use illicit drugs. Allergies  Allergen Reactions  .  Metformin And Related Other (See Comments)    Kidney problems   Prior to Admission medications   Medication Sig Start Date End Date Taking? Authorizing Provider  acetaminophen (TYLENOL) 500 MG tablet Take 1,000 mg by mouth 2 (two) times daily as needed (pain).   Yes Historical Provider, MD  aspirin EC 81 MG tablet Take 1 tablet (81 mg total) by mouth daily. 06/11/13  Yes Daniel J Angiulli, PA-C  atorvastatin (LIPITOR) 10 MG tablet Take 1 tablet (10 mg total) by mouth daily. 06/11/13  Yes Daniel J Angiulli, PA-C  collagenase (SANTYL) ointment Apply 1 application topically daily.   Yes Historical Provider, MD  gabapentin (NEURONTIN) 300 MG capsule Take 1 capsule (300 mg total) by mouth at bedtime. 06/11/13  Yes Daniel J Angiulli, PA-C  HYDROcodone-acetaminophen (NORCO/VICODIN) 5-325 MG per tablet Take 2 tablets by mouth 2 (two) times daily as needed for moderate pain.   Yes Historical Provider, MD  Insulin Glargine (LANTUS SOLOSTAR) 100 UNIT/ML Solostar Pen Inject 5-10 Units into the skin 2 (two) times daily. Inject 5 units every morning and 10 units every night   Yes Historical Provider, MD  multivitamin (RENA-VIT) TABS tablet Take 1 tablet by mouth at bedtime. 06/11/13  Yes Daniel J Angiulli, PA-C  nitroGLYCERIN (NITRODUR - DOSED IN MG/24 HR) 0.1 mg/hr patch Place 0.2 mg onto the skin daily. Leave on for 12 hours, then remove for 12 hours 07/02/13  Yes Historical Provider, MD  omeprazole (PRILOSEC) 40 MG capsule Take 1 capsule (40 mg total) by mouth daily. 06/11/13  Yes Daniel J Angiulli, PA-C  oxyCODONE-acetaminophen (PERCOCET/ROXICET) 5-325 MG per tablet Take 1-2 tablets by mouth every 4 (four) hours as needed (pain).  07/03/13  Yes Historical Provider, MD  sertraline (ZOLOFT) 50 MG tablet Take 50 mg by mouth daily.   Yes Historical Provider, MD  sevelamer carbonate (RENVELA) 800 MG tablet Take 2 tablets (1,600 mg total) by mouth 3 (three) times daily with meals. 06/11/13  Yes Daniel J Angiulli, PA-C   triamcinolone cream (KENALOG) 0.1 % Apply 1 application topically daily as needed (pain).  03/30/13  Yes Historical Provider, MD   Current Facility-Administered Medications  Medication Dose Route Frequency Provider Last Rate Last Dose  . acetaminophen (TYLENOL) tablet 1,000 mg  1,000 mg Oral BID PRN Etta Quill, DO      . aspirin EC tablet 81 mg  81 mg Oral Daily Etta Quill, DO   81 mg at 07/10/13 0007  . atorvastatin (LIPITOR) tablet 10 mg  10 mg Oral q1800 Etta Quill, DO   10 mg at 07/10/13 0007  . gabapentin (NEURONTIN) capsule 300 mg  300 mg Oral QHS Etta Quill, DO  300 mg at 07/09/13 2306  . heparin injection 5,000 Units  5,000 Units Subcutaneous 3 times per day Etta Quill, DO   5,000 Units at 07/10/13 6415  . HYDROcodone-acetaminophen (NORCO/VICODIN) 5-325 MG per tablet 2 tablet  2 tablet Oral BID PRN Etta Quill, DO      . insulin aspart (novoLOG) injection 0-15 Units  0-15 Units Subcutaneous TID WC Velna Hatchet, MD   3 Units at 07/10/13 [phone]  . insulin glargine (LANTUS) injection 10 Units  10 Units Subcutaneous QHS Etta Quill, DO   10 Units at 07/10/13 0011  . insulin glargine (LANTUS) injection 5 Units  5 Units Subcutaneous Daily Etta Quill, DO      . multivitamin (RENA-VIT) tablet 1 tablet  1 tablet Oral QHS Etta Quill, DO   1 tablet at 07/10/13 0008  . nitroGLYCERIN (NITRODUR - Dosed in mg/24 hr) patch 0.2 mg  0.2 mg Transdermal Q24H Velna Hatchet, MD      . oxyCODONE-acetaminophen (PERCOCET/ROXICET) 5-325 MG per tablet 1-2 tablet  1-2 tablet Oral Q4H PRN Etta Quill, DO      . pantoprazole (PROTONIX) EC tablet 80 mg  80 mg Oral Daily Etta Quill, DO   80 mg at 07/10/13 0019  . sertraline (ZOLOFT) tablet 50 mg  50 mg Oral Daily Etta Quill, DO   50 mg at 07/10/13 0008  . sevelamer carbonate (RENVELA) tablet 1,600 mg  1,600 mg Oral TID WC Etta Quill, DO   1,600 mg at 07/10/13 0007  . sodium chloride 0.9 % injection 3 mL  3  mL Intravenous Q12H Etta Quill, DO   3 mL at 07/09/13 2312   Labs: Basic Metabolic Panel:  Recent Labs Lab 07/09/13 1037 07/09/13 1701 07/10/13 0358  NA 139 139 141  K 4.8 4.8 4.2  CL  --  96 98  CO2  --  23 27  GLUCOSE 117* 139* 208*  BUN  --  39* 47*  CREATININE  --  5.76* 6.67*  CALCIUM  --  8.1* 7.7*   Liver Function Tests: No results found for this basename: AST, ALT, ALKPHOS, BILITOT, PROT, ALBUMIN,  in the last 168 hours No results found for this basename: LIPASE, AMYLASE,  in the last 168 hours No results found for this basename: AMMONIA,  in the last 168 hours CBC:  Recent Labs Lab 07/09/13 1037 07/09/13 1759 07/10/13 0358  WBC  --  19.3* 18.0*  NEUTROABS  --  16.4*  --   HGB 15.0 10.1* 8.5*  HCT 44.0 32.8* 27.8*  MCV  --  95.1 95.2  PLT  --  333 305   Cardiac Enzymes:  Recent Labs Lab 07/10/13 0546  TROPONINI <0.30   CBG:  Recent Labs Lab 07/09/13 1011 07/09/13 1334 07/09/13 2050 07/10/13 0909  GLUCAP 107* 101* 123* 166*   IDg Chest 2 View  07/09/2013   CLINICAL DATA:  Syncope.  Fall.  EXAM: CHEST  2 VIEW  COMPARISON:  05/24/2013  FINDINGS: The heart size and mediastinal contours are within normal limits. Both lungs are clear. The visualized skeletal structures are unremarkable.  IMPRESSION: No active cardiopulmonary disease.   Electronically Signed   By: Earle Gell M.D.   On: 07/09/2013 20:00   Dg Shoulder Left  07/09/2013   CLINICAL DATA:  Left shoulder pain.  EXAM: LEFT SHOULDER - 2+ VIEW  COMPARISON:  None.  FINDINGS: There is no evidence of fracture or dislocation. There  is no evidence of arthropathy or other focal bone abnormality. Soft tissues are unremarkable.  IMPRESSION: Negative.   Electronically Signed   By: Earle Gell M.D.   On: 07/09/2013 20:07    ROS: 10 pt ROS asked and answered. All systems negative except as in the HPI above.   Physical Exam: Filed Vitals:   07/09/13 2041 07/10/13 0201 07/10/13 0455 07/10/13 0803   BP: 139/66 128/62 141/63 132/68  Pulse: 90 83 79 81  Temp: 98.5 F (36.9 C) 98.9 F (37.2 C) 98.2 F (36.8 C) 97.8 F (36.6 C)  TempSrc: Oral Oral Oral Oral  Resp: _0 Height: _1  (1.88 m)     Weight: 80.2 kg (176 lb 12.9 oz)     SpO2: 100% 95% 97% 97%     General: Well developed, well nourished, in no acute distress. Head: Normocephalic, atraumatic, sclera non-icteric, mucus membranes are moist Neck: Supple. JVD not elevated. Lungs: Clear bilaterally to auscultation without wheezes, rales, or rhonchi. Breathing is unlabored. Heart: RRR with S1 S2. No murmurs, rubs, or gallops appreciated. Abdomen: Soft, non-tender, non-distended with normoactive bowel sounds. No rebound/guarding. No obvious abdominal masses. M-S:  Strength and tone appear normal for age. Lower extremities: Multiple gangrenous ulcerations to bilat toes/LE's consistent with calcyphylaxis. Most are dressed. Trace LE edema. Neuro: Alert and oriented X 3. Moves all extremities spontaneously. Psych:  Responds to questions appropriately with a normal affect. Dialysis Access: RFA AVF + bruit  Dialysis Orders: Center: [facility]  on TTS. EDW 81.5kg HD Bath 2K/2.25 Ca  Time 4:00 Heparin 2500. Access R AVF BFR 400 DFR A1.5    Hectorol 0 mcg IV/HD Epogen 20K  Units IV/HD  Venofer  0, Na thiosulfate 25 gmIV/HD  Recent labs: Hgb 10.9, Tsat 24% in Jan, Ferr 1830, Phos 3.3, PTH 208  Assessment/Plan: 1. Syncope - Mgmt per admit. Multiple episodes on the way home after removal of PD cath today. Has history of hypotension. Midodrine was recently d/c'd op per #4 below. Cardiac enzymes cycling. Echo and Carotid U/S pending 2. ESRD -  PD catheter removed 2/20 due to poor tolerance. Now at [facility] on TTS schedule. K+ 4.2, HD today 3. Calciphylaxis - on Na thiosulfate with HD. 4. Bilat foot wounds - Followed by Dr Andree Elk at [facility] in [place]. Microvascular dz at the ankle complicated by calciphylaxis. Midodrine was d/c'd at last OV,  thought to be causing vasoconstriction and poor LE wound healing.  On 0.2 mg nitro patches to bilat feet, 12 hrs on/ 12 hrs off, ordered but have not been applied yet. 5. Hypertension/volume  - SBPs 130s-140s s/p IVF. CXR unremarkable. UF 2L today as tolerated. 6. Anemia  - Hgb 8.5 < 10.9 outpatient on high dose ESA's. Post op drop anticipated. Start Aranesp 150 here. Last Tsat 24%. ^ Ferritin 1830 likely due to foot wounds. No Fe for now. Follow CBC 7. Metabolic bone disease -  Ca 7.7 on 2.25Ca bath Last op Phos 3.3. PTH within range. No op Vit D. Continue Renvela. Follow labs 8. Nutrition - Renal carb mod diet. Multivitamin 9. DM - Insulin per primary 10. Hx Afib - Not on anticoagulation secondary to possible coumadin necrosis  Sonnie Alamo, PA-C [facility] Pager [phone] 07/10/2013, 11:14 AM   Renal Attending:  Hgb on 2/19 was 10.9g, 2/12 11.1, 2/5 10.9.  On 2/20 Hgb was 10.1 preop. Today 8.5 gm. Will repeat.  EBL not recorded. Will decide about potential need for PRBCs.  Muaaz Brau C

## 2013-07-10 NOTE — Progress Notes (Signed)
Utilization Review Completed.   Valincia Touch, RN, BSN Nurse Case Manager  

## 2013-07-10 NOTE — Procedures (Signed)
Tolerating hemodialysis treatment.  Hbg down to 8.1 with recent syncope meeting criteria for PRBSc. Paul [name] C

## 2013-07-10 NOTE — Progress Notes (Signed)
New Admission Note:   Arrival Method: Via stretcher from the ED Mental Orientation: A & O x 4  Telemetry: Yes Assessment: Completed Skin: Skin is dry.  He has LLQ surgical wound covered by gauze dsg that is CDI.  He had his PD catheter removed the day of admission.  Also, he has wounds to both feet that are wrapped in gauze.  Per patient and his wife, they change both dressings daily with Santyl and gauze.  They requested that wound care do the next dressing since this dressing had already been done.  IV:  Left AC Pain: Rates bilateral foot pain as 1/10 Tubes: None Safety Measures: Safety Fall Prevention Plan has been given, discussed and signed Admission: Completed [place] Orientation: Patient has been orientated to the room, unit and staff.  Family: Wife at bedside  Orders have been reviewed and implemented. Will continue to monitor the patient. Call light has been placed within reach and bed alarm has been activated.   Earleen Reaper RN- [place], [place] Phone number: [phone]

## 2013-07-10 NOTE — Progress Notes (Signed)
[facility] PROGRESS NOTE Paul [name] [name], MD               Pager [phone] (if 7P to 7A, page night hospitalist on [email or web address] [name] [id number] DOB: 11/24/1955 DOA: 07/09/2013 PCP: Paul Amel, MD  Assessment/Plan: 1. Syncope - TTE and CUS ordered. Monitoring on Tele. qACHS blood sugars. Checking TSH  2. ESRD - TTS HD. Dr C aware of need for iHD today  3. DM - cont home ASA 81 and insulin + SSI  4. Leukocytosis - cause remains unclear . No fevers or abd tenderness. Will cx and start abx if fevers  5. Hx of afib - was on coumadin in past. Will monitor on tele for recurrence of arrythmia. EKG today and in AM as well    DVT - hep q8hrs     Principal Problem:   Syncope Active Problems:   End stage renal disease   DM (diabetes mellitus)   Code Status: full  Family Communication: none  Disposition Plan: home likely after 48 hours of monitoring   Velna Hatchet, MD  Internal Medicine Pager [phone].  If 7PM-7AM, please contact night-coverage at [email or web address], password [email or web address] 07/10/2013, 7:54 AM   LOS: 1 day   Brief narrative: ESRD patient admitted after repeated syncope. Recently had PD catheter removed and now on iHD. PD was not removed b/c of infection   Consultants:  nephro - ESRD   Procedures:  iHD   Antibiotics:  None  ?  HPI/Subjective: No events overnight  Objective: Filed Vitals:   07/09/13 1915 07/09/13 2041 07/10/13 0201 07/10/13 0455  BP: 129/63 139/66 128/62 141/63  Pulse: 85 90 83 79  Temp:  98.5 F (36.9 C) 98.9 F (37.2 C) 98.2 F (36.8 C)  TempSrc:  Oral Oral Oral  Resp: 12 16 12 12   Height:  6\' 2"  (1.88 m)    Weight:  80.2 kg (176 lb 12.9 oz)    SpO2: 100% 100% 95% 97%    Intake/Output Summary (Last 24 hours) at 07/10/13 0754 Last data filed at 07/09/13 2300  Gross per 24 hour  Intake    480 ml  Output      0 ml  Net    480 ml    Exam:   General:  WF in NAD   HEENT: MMM, anicteric   Lungs: CTAB, no  wheezing or rales   Cards: RRR, no MRG   Abd: bandages in place. Nontender. nondistended   Ext: no edema or cyanosis   Data Reviewed: Basic Metabolic Panel:  Recent Labs Lab 07/09/13 1037 07/09/13 1701 07/10/13 0358  NA 139 139 141  K 4.8 4.8 4.2  CL  --  96 98  CO2  --  23 27  GLUCOSE 117* 139* 208*  BUN  --  39* 47*  CREATININE  --  5.76* 6.67*  CALCIUM  --  8.1* 7.7*   Liver Function Tests: No results found for this basename: AST, ALT, ALKPHOS, BILITOT, PROT, ALBUMIN,  in the last 168 hours No results found for this basename: LIPASE, AMYLASE,  in the last 168 hours No results found for this basename: AMMONIA,  in the last 168 hours CBC:  Recent Labs Lab 07/09/13 1037 07/09/13 1759 07/10/13 0358  WBC  --  19.3* 18.0*  NEUTROABS  --  16.4*  --   HGB 15.0 10.1* 8.5*  HCT 44.0 32.8* 27.8*  MCV  --  95.1 95.2  PLT  --  333 305  Cardiac Enzymes:  Recent Labs Lab 07/10/13 0546  TROPONINI <0.30   BNP: No components found with this basename: POCBNP,  CBG:  Recent Labs Lab 07/09/13 1011 07/09/13 1334 07/09/13 2050  GLUCAP 107* 101* 123*    Recent Results (from the past 240 hour(s))  MRSA PCR SCREENING     Status: None   Collection Time    07/09/13  9:09 PM      Result Value Ref Range Status   MRSA by PCR NEGATIVE  NEGATIVE Final   Comment:            The GeneXpert MRSA Assay (FDA     approved for NASAL specimens     only), is one component of a     comprehensive MRSA colonization     surveillance program. It is not     intended to diagnose MRSA     infection nor to guide or     monitor treatment for     MRSA infections.     Studies: Dg Chest 2 View  07/09/2013   CLINICAL DATA:  Syncope.  Fall.  EXAM: CHEST  2 VIEW  COMPARISON:  05/24/2013  FINDINGS: The heart size and mediastinal contours are within normal limits. Both lungs are clear. The visualized skeletal structures are unremarkable.  IMPRESSION: No active cardiopulmonary disease.    Electronically Signed   By: Earle Gell M.D.   On: 07/09/2013 20:00   Dg Shoulder Left  07/09/2013   CLINICAL DATA:  Left shoulder pain.  EXAM: LEFT SHOULDER - 2+ VIEW  COMPARISON:  None.  FINDINGS: There is no evidence of fracture or dislocation. There is no evidence of arthropathy or other focal bone abnormality. Soft tissues are unremarkable.  IMPRESSION: Negative.   Electronically Signed   By: Earle Gell M.D.   On: 07/09/2013 20:07    Scheduled Meds: . aspirin EC  81 mg Oral Daily  . atorvastatin  10 mg Oral q1800  . gabapentin  300 mg Oral QHS  . heparin  5,000 Units Subcutaneous 3 times per day  . insulin glargine  10 Units Subcutaneous QHS  . insulin glargine  5 Units Subcutaneous Daily  . multivitamin  1 tablet Oral QHS  . nitroGLYCERIN  0.2 mg Transdermal Q24H  . pantoprazole  80 mg Oral Daily  . sertraline  50 mg Oral Daily  . sevelamer carbonate  1,600 mg Oral TID WC  . sodium chloride  3 mL Intravenous Q12H   Continuous Infusions:    Time Spent: 30 minutes

## 2013-07-11 DIAGNOSIS — R55 Syncope and collapse: Secondary | ICD-10-CM

## 2013-07-11 DIAGNOSIS — D649 Anemia, unspecified: Secondary | ICD-10-CM

## 2013-07-11 LAB — RENAL FUNCTION PANEL
Albumin: 1.5 g/dL — ABNORMAL LOW (ref 3.5–5.2)
BUN: 35 mg/dL — AB (ref 6–23)
CALCIUM: 7.8 mg/dL — AB (ref 8.4–10.5)
CO2: 24 mEq/L (ref 19–32)
Chloride: 98 mEq/L (ref 96–112)
Creatinine, Ser: 4.7 mg/dL — ABNORMAL HIGH (ref 0.50–1.35)
GFR calc Af Amer: 14 mL/min — ABNORMAL LOW (ref >90)
GFR calc non Af Amer: 12 mL/min — ABNORMAL LOW (ref >90)
Glucose, Bld: 100 mg/dL — ABNORMAL HIGH (ref 70–99)
PHOSPHORUS: 4.1 mg/dL (ref 2.3–4.6)
Potassium: 4.6 mEq/L (ref 3.7–5.3)
Sodium: 139 mEq/L (ref 137–147)

## 2013-07-11 LAB — CBC
HEMATOCRIT: 33.1 % — AB (ref 39.0–52.0)
Hemoglobin: 10.2 g/dL — ABNORMAL LOW (ref 13.0–17.0)
MCH: 28.5 pg (ref 26.0–34.0)
MCHC: 30.8 g/dL (ref 30.0–36.0)
MCV: 92.5 fL (ref 78.0–100.0)
PLATELETS: 234 10*3/uL (ref 150–400)
RBC: 3.58 MIL/uL — ABNORMAL LOW (ref 4.22–5.81)
RDW: 18.1 % — AB (ref 11.5–15.5)
WBC: 15.4 10*3/uL — ABNORMAL HIGH (ref 4.0–10.5)

## 2013-07-11 LAB — TYPE AND SCREEN
ABO/RH(D): A NEG
Antibody Screen: NEGATIVE
UNIT DIVISION: 0
Unit division: 0

## 2013-07-11 LAB — GLUCOSE, CAPILLARY
GLUCOSE-CAPILLARY: 135 mg/dL — AB (ref 70–99)
Glucose-Capillary: 125 mg/dL — ABNORMAL HIGH (ref 70–99)
Glucose-Capillary: 155 mg/dL — ABNORMAL HIGH (ref 70–99)
Glucose-Capillary: 95 mg/dL (ref 70–99)

## 2013-07-11 LAB — TSH: TSH: 2.188 u[IU]/mL (ref 0.350–4.500)

## 2013-07-11 MED ORDER — COLLAGENASE 250 UNIT/GM EX OINT
TOPICAL_OINTMENT | Freq: Every day | CUTANEOUS | Status: DC
  Administered 2013-07-11 – 2013-07-13 (×3): via TOPICAL
  Filled 2013-07-11: qty 30

## 2013-07-11 MED ORDER — NITROGLYCERIN 0.1 MG/HR TD PT24
0.1000 mg | MEDICATED_PATCH | Freq: Every day | TRANSDERMAL | Status: DC
  Administered 2013-07-11: 0.1 mg via TRANSDERMAL
  Filled 2013-07-11 (×2): qty 1

## 2013-07-11 NOTE — Consult Note (Signed)
WOC wound consult note Reason for Consult: Vasculitic ulcers. Seen weekly by the outpatient wound care center at [facility] and now followed by Dr. Andree Elk (of [facility]) at [facility] in [place] who has had some success in similar cases using nitro patches to increase blood flow. Patient has been seen there twice and has an appointment there on Wednesday, 2/25. Wound type:Vascular Pressure Ulcer POA: No Measurement:Per nursing notes.  Numerous ulcerations (full thickness) that are covered with stable dry eschar. Two significant ulcers (one each on the left medial and right medial LE, L>R) that are crater-like with 40% necrotic slough.  Wound bed: The largest ulcerations are 40% pink, 60% necrotic and these are supported in enzymatic debridement with Santyl (collagenase).  Left medial LE measures:8.5 x 5cm with depth obscured by necrotic slough.  Right medial LE measures 7cm x 4cm  With depth obscured by necrotic slough. Drainage (amount, consistency, odor) Small amount of serous exudate from largest ulcers.  None from eschar. Periwound:There is no periwound erythema or induration, odor only from necrotic left hallux that is auto-debriding. Dressing procedure/placement/frequency: Stable eschar will be cleansed daily and covered with dry dressings.  Two crater lesions will be treated with collagenase ointment once daily.  Nitro patches are continued per medical order. [place] nursing team will not follow, but will remain available to this patient, the nursing and medical team.  Please re-consult if needed. Thanks, Maudie Flakes, MSN, RN, [place], [place], [place] [phone])

## 2013-07-11 NOTE — Progress Notes (Signed)
Patient refused to allow nurses to assess and change dressing on his bilateral legs,feet and toes.

## 2013-07-11 NOTE — Progress Notes (Signed)
VASCULAR LAB PRELIMINARY  PRELIMINARY  PRELIMINARY  PRELIMINARY  Carotid Dopplers completed.    Preliminary report:  1-39% ICA stenosis.  Vertebral artery flow is antegrade.  Asharia Lotter, RVT 07/11/2013, 12:55 PM

## 2013-07-11 NOTE — Progress Notes (Signed)
[facility] PROGRESS NOTE Paul [name] [name], MD               Pager [phone] (if 7P to 7A, page night hospitalist on [email or web address] [name] [id number] DOB: [birth year] DOA: 07/09/2013 PCP: Paul Amel, MD  Assessment/Plan: 1. Syncope - TTE and CUS ordered, still not performed. Monitoring on Tele that shows only minor PVCs, no arrythmia. qACHS blood sugars. TSH WNL 2. ESRD - TTS HD. nephro on board  3. DM - cont home ASA 81 and insulin + SSI  4. Leukocytosis - resolved 5. Hx of afib - no signs of arrythmia on EKG or tele. Not on coumadin b/c of risk of coumadin necrosis 6. LE wounds - Followed by Dr Paul [name] at [facility] in [place]. Nitro patches ordered for LE to help w/ blood flow. Wound to see in AM  7. Anemia - pending Hgb this AM. Will transfuse as needed for goal of 8.    DVT - hep q8hrs     Principal Problem:   Syncope Active Problems:   End stage renal disease   DM (diabetes mellitus)   Code Status: full  Family Communication: none  Disposition Plan: home likely tues/wed  Paul Hatchet, MD  Internal Medicine Pager [phone].  If 7PM-7AM, please contact night-coverage at [email or web address], password [facility] 07/11/2013, 8:31 AM   LOS: 2 days   Brief narrative: ESRD patient admitted after repeated syncope. Recently had PD catheter removed and now on iHD. PD was not removed b/c of infection   Consultants:  nephro - ESRD   Procedures:  iHD   Antibiotics:  None  ?  HPI/Subjective: No events overnight  Objective: Filed Vitals:   07/10/13 1730 07/10/13 1753 07/10/13 2037 07/11/13 0448  BP: 149/84 151/86 164/79 129/67  Pulse: 92 89 88 71  Temp:  98.5 F (36.9 C) 99.9 F (37.7 C) 97.5 F (36.4 C)  TempSrc:  Oral Oral Oral  Resp:  20 20 16   Height:      Weight:  82.2 kg (181 lb 3.5 oz)    SpO2:  94% 97% 97%    Intake/Output Summary (Last 24 hours) at 07/11/13 0831 Last data filed at 07/10/13 2300  Gross per 24 hour  Intake    600 ml   Output   1875 ml  Net  -1275 ml    Exam:   General:  WF in NAD   HEENT: MMM, anicteric   Lungs: CTAB, no wheezing or rales   Cards: RRR, no MRG   Abd: bandages in place. Nontender. nondistended   Ext: no edema or cyanosis . B/L bandages on feet   Data Reviewed: Basic Metabolic Panel:  Recent Labs Lab 07/09/13 1037 07/09/13 1701 07/10/13 0358  NA 139 139 141  K 4.8 4.8 4.2  CL  --  96 98  CO2  --  23 27  GLUCOSE 117* 139* 208*  BUN  --  39* 47*  CREATININE  --  5.76* 6.67*  CALCIUM  --  8.1* 7.7*   Liver Function Tests: No results found for this basename: AST, ALT, ALKPHOS, BILITOT, PROT, ALBUMIN,  in the last 168 hours No results found for this basename: LIPASE, AMYLASE,  in the last 168 hours No results found for this basename: AMMONIA,  in the last 168 hours CBC:  Recent Labs Lab 07/09/13 1037 07/09/13 1759 07/10/13 0358 07/10/13 1420  WBC  --  19.3* 18.0* 10.3  NEUTROABS  --  16.4*  --   --  HGB 15.0 10.1* 8.5* 8.2*  HCT 44.0 32.8* 27.8* 26.4*  MCV  --  95.1 95.2 94.0  PLT  --  333 305 275   Cardiac Enzymes:  Recent Labs Lab 07/10/13 0546 07/10/13 1151  TROPONINI <0.30 <0.30   BNP: No components found with this basename: POCBNP,  CBG:  Recent Labs Lab 07/10/13 0909 07/10/13 1147 07/10/13 1841 07/10/13 2132 07/11/13 0752  GLUCAP 166* 178* 147* 241* 125*    Recent Results (from the past 240 hour(s))  MRSA PCR SCREENING     Status: None   Collection Time    07/09/13  9:09 PM      Result Value Ref Range Status   MRSA by PCR NEGATIVE  NEGATIVE Final   Comment:            The GeneXpert MRSA Assay (FDA     approved for NASAL specimens     only), is one component of a     comprehensive MRSA colonization     surveillance program. It is not     intended to diagnose MRSA     infection nor to guide or     monitor treatment for     MRSA infections.     Studies: Dg Chest 2 View  07/09/2013   CLINICAL DATA:  Syncope.  Fall.   EXAM: CHEST  2 VIEW  COMPARISON:  05/24/2013  FINDINGS: The heart size and mediastinal contours are within normal limits. Both lungs are clear. The visualized skeletal structures are unremarkable.  IMPRESSION: No active cardiopulmonary disease.   Electronically Signed   By: Paul [name] M.D.   On: 07/09/2013 20:00   Dg Shoulder Left  07/09/2013   CLINICAL DATA:  Left shoulder pain.  EXAM: LEFT SHOULDER - 2+ VIEW  COMPARISON:  None.  FINDINGS: There is no evidence of fracture or dislocation. There is no evidence of arthropathy or other focal bone abnormality. Soft tissues are unremarkable.  IMPRESSION: Negative.   Electronically Signed   By: Paul [name] M.D.   On: 07/09/2013 20:07    Scheduled Meds: . aspirin EC  81 mg Oral Daily  . atorvastatin  10 mg Oral q1800  . collagenase   Topical Daily  . darbepoetin (ARANESP) injection - DIALYSIS  150 mcg Intravenous Q Sat-HD  . gabapentin  300 mg Oral QHS  . heparin  5,000 Units Subcutaneous 3 times per day  . insulin aspart  0-15 Units Subcutaneous TID WC  . insulin glargine  10 Units Subcutaneous QHS  . insulin glargine  5 Units Subcutaneous Daily  . multivitamin  1 tablet Oral QHS  . nitroGLYCERIN  0.2 mg Transdermal Q24H  . pantoprazole  80 mg Oral Daily  . sertraline  50 mg Oral Daily  . sevelamer carbonate  1,600 mg Oral TID WC  . sodium chloride  3 mL Intravenous Q12H  . sodium thiosulfate  25 g Intravenous Q T,Th,Sa-HD   Continuous Infusions:    Time Spent: 30 minutes

## 2013-07-11 NOTE — Progress Notes (Signed)
Utilization review completed.  

## 2013-07-11 NOTE — Progress Notes (Signed)
Nurse tried again and explained to patient about the needs to assess and change his dressing on his bilateral legs appropriately,as some foul odors started emanating from his bilateral lower extremities soiled dressing.Patient consented.Multiple necrotic/eschar dry wound noticed on his bilateral lower extremities,most significant of which are located on left and right great toes,rt medial leg and left medial necrotic slough with slight drainage coming from it.Cleaned and dressed as per order/protocol.Measurement charted on flow sheet.

## 2013-07-11 NOTE — Progress Notes (Signed)
[facility] Progress Note  Subjective:   No emerging c/o's  Objective Filed Vitals:   07/10/13 1753 07/10/13 2037 07/11/13 0448 07/11/13 0920  BP: 151/86 164/79 129/67 149/76  Pulse: 89 88 71 71  Temp: 98.5 F (36.9 C) 99.9 F (37.7 C) 97.5 F (36.4 C) 98.2 F (36.8 C)  TempSrc: Oral Oral Oral Oral  Resp: 20 20 16 19   Height:      Weight: 82.2 kg (181 lb 3.5 oz)     SpO2: 94% 97% 97% 99%   Physical Exam General: alert, cooperative, NAD Heart: RRR, no mrg Lungs:CTA bilat, no wheezes/rhonchi Abdomen: Soft, NT + BS Extremities: multiple ulcerations to bilat LE's wrapped, no LE edema Dialysis Access: R AVF + bruit  Dialysis Orders: Center: [facility] on TTS.  EDW 81.5kg HD Bath 2K/2.25 Ca Time 4:00 Heparin 2500. Access R AVF BFR 400 DFR A1.5  Hectorol 0 mcg IV/HD Epogen 20K Units IV/HD Venofer 0, Na thiosulfate 25 gmIV/HD  Recent labs: Hgb 10.9, Tsat 24% in Jan, Ferr 1830, Phos 3.3, PTH 208  Assessment/Plan: 1. Syncope - Mgmt per admit. Multiple episodes on the way home after removal of PD cath today. Has history of hypotension. Midodrine was recently d/c'd op per #4 below. Cardiac enzymes neg thus far. For Echo and Carotid U/S tomorrow; ABLA requiring PRBCs post op 2. ESRD - PD catheter removed 2/20 due to poor tolerance. Now at [facility] on TTS schedule. K+ 4.2, next HD 2/24 3. Calciphylaxis - on Na thiosulfate with HD. 4. Bilat foot wounds - Followed by Dr Andree Elk at [facility] in [place]. Microvascular dz at the ankle complicated by calciphylaxis. Midodrine was d/c'd at last OV, thought to be causing vasoconstriction and poor LE wound healing. On 0.2 mg nitro patches to bilat feet, 12 hrs on/ 12 hrs off 5. Hypertension/volume - SBPs 120s-140s. CXR unremarkable. Net UF ~1.9L yesterday 6. Anemia - Downward Hgb trend post op 8.2 < 8.5 < 10.9. Rec'd 2 units PRBCs on 2/21 +Aranesp 150. Last Tsat 24%. ^ Ferritin 1830 likely due to foot wounds. No Fe for now. Repeat CBC  pending. 7. Metabolic bone disease - Ca 7.7 on 2.25Ca bath Last op Phos 3.3. PTH within range. No op Vit D. Continue Renvela. Follow labs 8. Nutrition - Renal carb mod diet. Multivitamin 9. DM - Insulin per primary 10. Hx Afib - Not on anticoagulation secondary to possible coumadin necrosis  Collene Leyden. Cletus Gash, PA-C [facility] Pager [phone] 07/11/2013,9:50 AM  LOS: 2 days   Renal Attending: Got 2 units PRBCs with dialysis yesterday and feels better. Syncope wu in progress.  Wife spoke to Dr. Johney Maine and reportedly there was a fair amount of blood loss with surgery per pt.  Darious Rehman C   Additional Objective Labs: Basic Metabolic Panel:  Recent Labs Lab 07/09/13 1037 07/09/13 1701 07/10/13 0358  NA 139 139 141  K 4.8 4.8 4.2  CL  --  96 98  CO2  --  23 27  GLUCOSE 117* 139* 208*  BUN  --  39* 47*  CREATININE  --  5.76* 6.67*  CALCIUM  --  8.1* 7.7*   CBC:  Recent Labs Lab 07/09/13 1759 07/10/13 0358 07/10/13 1420  WBC 19.3* 18.0* 10.3  NEUTROABS 16.4*  --   --   HGB 10.1* 8.5* 8.2*  HCT 32.8* 27.8* 26.4*  MCV 95.1 95.2 94.0  PLT 333 305 275   Blood Culture    Component Value Date/Time   SDES  PERITONEAL ABDOMEN 05/24/2013 1804   SDES PERITONEAL ABDOMEN 05/24/2013 [place] PD PORT 05/24/2013 1804   SPECREQUEST PD PORT 05/24/2013 1804   CULT  Value: NO GROWTH 3 DAYS Performed at [facility] 05/24/2013 1804   REPTSTATUS 05/28/2013 FINAL 05/24/2013 1804   REPTSTATUS 05/24/2013 FINAL 05/24/2013 1804    Cardiac Enzymes:  Recent Labs Lab 07/10/13 0546 07/10/13 1151  TROPONINI <0.30 <0.30   CBG:  Recent Labs Lab 07/10/13 0909 07/10/13 1147 07/10/13 1841 07/10/13 2132 07/11/13 0752  GLUCAP 166* 178* 147* 241* 125*   Studies/Results: Dg Chest 2 View  07/09/2013   CLINICAL DATA:  Syncope.  Fall.  EXAM: CHEST  2 VIEW  COMPARISON:  05/24/2013  FINDINGS: The heart size and mediastinal contours are within normal limits. Both  lungs are clear. The visualized skeletal structures are unremarkable.  IMPRESSION: No active cardiopulmonary disease.   Electronically Signed   By: Earle Gell M.D.   On: 07/09/2013 20:00   Dg Shoulder Left  07/09/2013   CLINICAL DATA:  Left shoulder pain.  EXAM: LEFT SHOULDER - 2+ VIEW  COMPARISON:  None.  FINDINGS: There is no evidence of fracture or dislocation. There is no evidence of arthropathy or other focal bone abnormality. Soft tissues are unremarkable.  IMPRESSION: Negative.   Electronically Signed   By: Earle Gell M.D.   On: 07/09/2013 20:07   Medications:   . aspirin EC  81 mg Oral Daily  . atorvastatin  10 mg Oral q1800  . collagenase   Topical Daily  . darbepoetin (ARANESP) injection - DIALYSIS  150 mcg Intravenous Q Sat-HD  . gabapentin  300 mg Oral QHS  . heparin  5,000 Units Subcutaneous 3 times per day  . insulin aspart  0-15 Units Subcutaneous TID WC  . insulin glargine  10 Units Subcutaneous QHS  . insulin glargine  5 Units Subcutaneous Daily  . multivitamin  1 tablet Oral QHS  . nitroGLYCERIN  0.2 mg Transdermal Q24H  . pantoprazole  80 mg Oral Daily  . sertraline  50 mg Oral Daily  . sevelamer carbonate  1,600 mg Oral TID WC  . sodium chloride  3 mL Intravenous Q12H  . sodium thiosulfate  25 g Intravenous Q T,Th,Sa-HD

## 2013-07-11 NOTE — Progress Notes (Signed)
Nurses offered and explained to the patient how  the overlay mattress works and how it would help him more comfortable and hoe it prevent the development of pressure ulcer especially on his bilateral heels and sacral area since most of the time he is lying flat on his back in his bed.Patient refused.

## 2013-07-12 ENCOUNTER — Encounter (HOSPITAL_COMMUNITY): Payer: Self-pay | Admitting: Surgery

## 2013-07-12 ENCOUNTER — Observation Stay (HOSPITAL_COMMUNITY): Payer: Managed Care, Other (non HMO)

## 2013-07-12 DIAGNOSIS — L03115 Cellulitis of right lower limb: Secondary | ICD-10-CM

## 2013-07-12 DIAGNOSIS — I4891 Unspecified atrial fibrillation: Secondary | ICD-10-CM

## 2013-07-12 DIAGNOSIS — D62 Acute posthemorrhagic anemia: Secondary | ICD-10-CM

## 2013-07-12 DIAGNOSIS — L03116 Cellulitis of left lower limb: Secondary | ICD-10-CM

## 2013-07-12 DIAGNOSIS — L02419 Cutaneous abscess of limb, unspecified: Secondary | ICD-10-CM

## 2013-07-12 DIAGNOSIS — L03119 Cellulitis of unspecified part of limb: Secondary | ICD-10-CM

## 2013-07-12 LAB — GLUCOSE, CAPILLARY
GLUCOSE-CAPILLARY: 134 mg/dL — AB (ref 70–99)
Glucose-Capillary: 66 mg/dL — ABNORMAL LOW (ref 70–99)
Glucose-Capillary: 66 mg/dL — ABNORMAL LOW (ref 70–99)
Glucose-Capillary: 79 mg/dL (ref 70–99)
Glucose-Capillary: 93 mg/dL (ref 70–99)
Glucose-Capillary: 98 mg/dL (ref 70–99)

## 2013-07-12 LAB — CBC
HCT: 32.4 % — ABNORMAL LOW (ref 39.0–52.0)
Hemoglobin: 10 g/dL — ABNORMAL LOW (ref 13.0–17.0)
MCH: 28.7 pg (ref 26.0–34.0)
MCHC: 30.9 g/dL (ref 30.0–36.0)
MCV: 92.8 fL (ref 78.0–100.0)
Platelets: 287 10*3/uL (ref 150–400)
RBC: 3.49 MIL/uL — AB (ref 4.22–5.81)
RDW: 17.6 % — AB (ref 11.5–15.5)
WBC: 18.3 10*3/uL — ABNORMAL HIGH (ref 4.0–10.5)

## 2013-07-12 LAB — RENAL FUNCTION PANEL
Albumin: 1.5 g/dL — ABNORMAL LOW (ref 3.5–5.2)
BUN: 53 mg/dL — ABNORMAL HIGH (ref 6–23)
CHLORIDE: 99 meq/L (ref 96–112)
CO2: 26 mEq/L (ref 19–32)
Calcium: 7.8 mg/dL — ABNORMAL LOW (ref 8.4–10.5)
Creatinine, Ser: 6.28 mg/dL — ABNORMAL HIGH (ref 0.50–1.35)
GFR calc non Af Amer: 9 mL/min — ABNORMAL LOW (ref >90)
GFR, EST AFRICAN AMERICAN: 10 mL/min — AB (ref >90)
GLUCOSE: 67 mg/dL — AB (ref 70–99)
PHOSPHORUS: 3.9 mg/dL (ref 2.3–4.6)
POTASSIUM: 4.5 meq/L (ref 3.7–5.3)
SODIUM: 139 meq/L (ref 137–147)

## 2013-07-12 MED ORDER — NITROGLYCERIN 0.1 MG/HR TD PT24
0.1000 mg | MEDICATED_PATCH | Freq: Every day | TRANSDERMAL | Status: DC
  Administered 2013-07-12: 0.1 mg via TRANSDERMAL
  Filled 2013-07-12: qty 1

## 2013-07-12 MED ORDER — VANCOMYCIN HCL IN DEXTROSE 1-5 GM/200ML-% IV SOLN
1000.0000 mg | INTRAVENOUS | Status: DC
  Administered 2013-07-13: 1000 mg via INTRAVENOUS
  Filled 2013-07-12 (×2): qty 200

## 2013-07-12 MED ORDER — VANCOMYCIN HCL 10 G IV SOLR
1750.0000 mg | Freq: Once | INTRAVENOUS | Status: AC
  Administered 2013-07-12: 1750 mg via INTRAVENOUS
  Filled 2013-07-12: qty 1750

## 2013-07-12 NOTE — Evaluation (Signed)
Physical Therapy Evaluation Patient Details Name: Paul [name] MRN: [id number] DOB: 06/08/1955 Today's Date: 07/12/2013 Time: [id number] PT Time Calculation (min): 22 min  PT Assessment / Plan / Recommendation History of Present Illness  Paul [name] is a 58 y.o. male who presents to the ED after multiple episodes of syncope.  Syncopal episodes occurred after arriving home and getting out of his car after just having had his PD catheter removed at [facility] today.  Patient has ESRD dialysis TTS.  PD catheter removal was due to intolerance to PD not due to infection.  Patient also had syncope with EMS, BP 98/76.  Hospitalist asked to admit for syncope.  Clinical Impression  Pt functioning near baseline. Pt with mild deconditioning compared to PTA due to decreased activity over last week. Pt safe to d/c home once medically stable. Recommend pt to con't outpt PT services as PTA. Pt family to provide 24/7 assist and pt with all recommended DME.    PT Assessment  Patient needs continued PT services    Follow Up Recommendations  Outpatient PT (con't from PTA)    Does the patient have the potential to tolerate intense rehabilitation      Barriers to Discharge        Equipment Recommendations  None recommended by PT    Recommendations for Other Services     Frequency Min 3X/week    Precautions / Restrictions Precautions Precautions: Fall Precaution Comments: previous episodes of syncope Restrictions Weight Bearing Restrictions: No   Pertinent Vitals/Pain bilat LE discomfort      Mobility  Bed Mobility Overal bed mobility: Modified Independent General bed mobility comments: increased time, use of bed rail Transfers Overall transfer level: Needs assistance Equipment used: Rolling walker (2 wheeled) Transfers: Sit to/from Stand Sit to Stand: Min assist General transfer comment: increased time Ambulation/Gait Ambulation/Gait assistance: Min guard Ambulation Distance (Feet): 150  Feet Assistive device: Rolling walker (2 wheeled) Gait Pattern/deviations: Step-through pattern Gait velocity: slow Gait velocity interpretation: Below normal speed for age/gender General Gait Details: no episodes of LOB, c/o LE discomfort, increased bilat UE WBing    Exercises     PT Diagnosis: Difficulty walking;Acute pain  PT Problem List: Decreased strength;Decreased activity tolerance;Decreased balance PT Treatment Interventions: DME instruction;Gait training;Stair training;Functional mobility training;Therapeutic activities;Therapeutic exercise;Balance training     PT Goals(Current goals can be found in the care plan section) Acute Rehab PT Goals Patient Stated Goal: home PT Goal Formulation: With patient Time For Goal Achievement: 07/19/13 Potential to Achieve Goals: Good  Visit Information  Last PT Received On: 07/12/13 Assistance Needed: +1 History of Present Illness: Paul [name] is a 58 y.o. male who presents to the ED after multiple episodes of syncope.  Syncopal episodes occurred after arriving home and getting out of his car after just having had his PD catheter removed at [facility] today.  Patient has ESRD dialysis TTS.  PD catheter removal was due to intolerance to PD not due to infection.  Patient also had syncope with EMS, BP 98/76.  Hospitalist asked to admit for syncope.       Prior [place] expects to be discharged to:: Private residence Living Arrangements: Spouse/significant other Available Help at Discharge: Family;Available 24 hours/day Type of Home: House Home Access: Stairs to enter [employer] of Steps: 4 Entrance Stairs-Rails: Right;Left Home Layout: Two level;Able to live on main level with bedroom/bathroom;Full bath on main level Home Equipment: Walker - 2 wheels;Shower seat;Bedside commode Prior Function Level of  Independence: Needs assistance Gait / Transfers Assistance Needed: uses a RW ADL's / Homemaking  Assistance Needed: able to dress/bath self Comments: family assists with meal prep Communication Communication: No difficulties Dominant Hand: Right    Cognition  Cognition Arousal/Alertness: Awake/alert Behavior During Therapy: WFL for tasks assessed/performed Overall Cognitive Status: Within Functional Limits for tasks assessed    Extremity/Trunk Assessment Upper Extremity Assessment Upper Extremity Assessment: Overall WFL for tasks assessed Lower Extremity Assessment Lower Extremity Assessment: Generalized weakness (bilat foot ulcers) Cervical / Trunk Assessment Cervical / Trunk Assessment: Kyphotic   Balance Balance Overall balance assessment: Needs assistance Standing balance support: Single extremity supported Standing balance-Leahy Scale: Fair Standing balance comment: limited by discomfort of LEs  End of Session PT - End of Session Equipment Utilized During Treatment: Gait belt Activity Tolerance: Patient tolerated treatment well Patient left: in chair;with call bell/phone within reach;with nursing/sitter in room Nurse Communication: Mobility status  GP Functional Assessment Tool Used: clinical judgment Functional Limitation: Mobility: Walking and moving around Mobility: Walking and Moving Around Current Status [id number]): At least 20 percent but less than 40 percent impaired, limited or restricted Mobility: Walking and Moving Around Goal Status [phone]): At least 1 percent but less than 20 percent impaired, limited or restricted   Kingsley Callander 07/12/2013, 4:58 PM   Kittie Plater, PT, DPT Pager #: [phone] Office #: [phone]

## 2013-07-12 NOTE — Progress Notes (Signed)
Called Echo department to see if 2D echo would be done today, as original order was placed on Friday. They informed me that they might get to him today, but if not today, it would be done first thing tomorrow morning. Will pass this information along to night nurse so they can coordinate this with his dialysis schedule tomorrow.

## 2013-07-12 NOTE — Progress Notes (Signed)
[facility] PROGRESS NOTE  Paul [name] [id number] DOB: 08-08-1955 DOA: 07/09/2013 PCP: [name], MD  Assessment/Plan: Syncope -Secondary to orthostasis from blood loss anemia after removal of peritoneal dialysis catheter -Echocardiogram pending -carotid doppler neg -TSH 2.188 -EKG sinus rhythm without any ST-T wave changes -Telemetry with occasional PACs, PVCs--remains in sinus rhythm -Patient had positive orthostatics in the emergency department Cellulitis bilateral lower extremities -This likely accounts for the patient's leukocytosis -Start intravenous vancomycin -Etiology is due to open wounds from the patient's calciphylaxis Diabetes mellitus type 2  -Hemoglobin A1c 6.7 on 05/25/2013  -Continue home insulin regimen  -NovoLog sliding scale  Calciphylaxis with dry gangrene of the bilateral feet  -Continue nitroglycerin patches  -Local wound care  -Continue sodium thiosulfate on dialysis ESRD  -Appreciate nephrology followup  Anemia  -Transfused 2 units PRBCs over 21st 2015  -Patient had drop in hemoglobin secondary to blood loss anemia after removal of his PD catheter  History of atrial fibrillation  -Remains in sinus rhythm  -Not on Coumadin secondary to concerns for Coumadin necrosis  Family Communication:   Pt at beside Disposition Plan:   Home 07/13/13 AFTER HD if stable    Antibiotics:  Vancomycin  07/12/13>>>>    Procedures/Studies: Dg Chest 2 View  07/09/2013   CLINICAL DATA:  Syncope.  Fall.  EXAM: CHEST  2 VIEW  COMPARISON:  05/24/2013  FINDINGS: The heart size and mediastinal contours are within normal limits. Both lungs are clear. The visualized skeletal structures are unremarkable.  IMPRESSION: No active cardiopulmonary disease.   Electronically Signed   By: Earle Gell M.D.   On: 07/09/2013 20:00   Dg Shoulder Left  07/09/2013   CLINICAL DATA:  Left shoulder pain.  EXAM: LEFT SHOULDER - 2+ VIEW  COMPARISON:  None.  FINDINGS: There is no  evidence of fracture or dislocation. There is no evidence of arthropathy or other focal bone abnormality. Soft tissues are unremarkable.  IMPRESSION: Negative.   Electronically Signed   By: Earle Gell M.D.   On: 07/09/2013 20:07         Subjective: Patient complains of pain in his bilateral feet. Denies any fevers, chills, chest discomfort, shortness breath, nausea, vomiting, diarrhea, abdominal pain. No headache or dizziness.   Objective: Filed Vitals:   07/11/13 1814 07/11/13 2023 07/12/13 0441 07/12/13 0745  BP: 151/80 136/66 144/69 135/68  Pulse: 85 80 83 90  Temp: 97.1 F (36.2 C) 99 F (37.2 C) 98.5 F (36.9 C) 98.5 F (36.9 C)  TempSrc: Oral Oral Oral Oral  Resp: 19 18 18 18   Height:      Weight:  81.903 kg (180 lb 9 oz)    SpO2: 98% 94% 97% 97%    Intake/Output Summary (Last 24 hours) at 07/12/13 1046 Last data filed at 07/12/13 0915  Gross per 24 hour  Intake    720 ml  Output      0 ml  Net    720 ml   Weight change: -1.598 kg (-3 lb 8.4 oz) Exam:   General:  Pt is alert, follows commands appropriately, not in acute distress  HEENT: No icterus, No thrush,  [place]/AT  Cardiovascular: RRR, S1/S2, no rubs, no gallops  Respiratory: CTA bilaterally, no wheezing, no crackles, no rhonchi  Abdomen: Soft/+BS, non tender, non distended, no guarding  Extremities: No edema, bilateral dry gangrene of the great toes. There is erythema on the bilateral dorsal feet streaking up to the ankle area. No crepitance. He has scattered dry  eschar on wounds on the bilateral lower extremities below the knees without any crepitance or purulent drainage.  Data Reviewed:  Basic Metabolic Panel:  Recent Labs Lab 07/09/13 1037 07/09/13 1701 07/10/13 0358 07/11/13 0831 07/12/13 0645  NA 139 139 141 139 139  K 4.8 4.8 4.2 4.6 4.5  CL  --  96 98 98 99  CO2  --  23 27 24 26   GLUCOSE 117* 139* 208* 100* 67*  BUN  --  39* 47* 35* 53*  CREATININE  --  5.76* 6.67* 4.70* 6.28*   CALCIUM  --  8.1* 7.7* 7.8* 7.8*  PHOS  --   --   --  4.1 3.9   Liver Function Tests:  Recent Labs Lab 07/11/13 0831 07/12/13 0645  ALBUMIN 1.5* 1.5*   No results found for this basename: LIPASE, AMYLASE,  in the last 168 hours No results found for this basename: AMMONIA,  in the last 168 hours CBC:  Recent Labs Lab 07/09/13 1759 07/10/13 0358 07/10/13 1420 07/11/13 0831 07/12/13 0645  WBC 19.3* 18.0* 10.3 15.4* 18.3*  NEUTROABS 16.4*  --   --   --   --   HGB 10.1* 8.5* 8.2* 10.2* 10.0*  HCT 32.8* 27.8* 26.4* 33.1* 32.4*  MCV 95.1 95.2 94.0 92.5 92.8  PLT 333 305 275 234 287   Cardiac Enzymes:  Recent Labs Lab 07/10/13 0546 07/10/13 1151  TROPONINI <0.30 <0.30   BNP: No components found with this basename: POCBNP,  CBG:  Recent Labs Lab 07/11/13 1648 07/11/13 2027 07/12/13 0756 07/12/13 0809 07/12/13 0842  GLUCAP 135* 95 66* 66* 79    Recent Results (from the past 240 hour(s))  MRSA PCR SCREENING     Status: None   Collection Time    07/09/13  9:09 PM      Result Value Ref Range Status   MRSA by PCR NEGATIVE  NEGATIVE Final   Comment:            The GeneXpert MRSA Assay (FDA     approved for NASAL specimens     only), is one component of a     comprehensive MRSA colonization     surveillance program. It is not     intended to diagnose MRSA     infection nor to guide or     monitor treatment for     MRSA infections.     Scheduled Meds: . aspirin EC  81 mg Oral Daily  . atorvastatin  10 mg Oral q1800  . collagenase   Topical Daily  . darbepoetin (ARANESP) injection - DIALYSIS  150 mcg Intravenous Q Sat-HD  . gabapentin  300 mg Oral QHS  . heparin  5,000 Units Subcutaneous 3 times per day  . insulin aspart  0-15 Units Subcutaneous TID WC  . insulin glargine  10 Units Subcutaneous QHS  . insulin glargine  5 Units Subcutaneous Daily  . multivitamin  1 tablet Oral QHS  . nitroGLYCERIN  0.1 mg Transdermal Daily  . nitroGLYCERIN  0.1 mg  Transdermal Daily  . pantoprazole  80 mg Oral Daily  . sertraline  50 mg Oral Daily  . sevelamer carbonate  1,600 mg Oral TID WC  . sodium chloride  3 mL Intravenous Q12H  . sodium thiosulfate  25 g Intravenous Q T,Th,Sa-HD   Continuous Infusions:    Paul [name], [name], DO  [facility] Pager [phone]  If 7PM-7AM, please contact night-coverage [email or web address] Password [facility] 07/12/2013, 10:46 AM   LOS:  3 days

## 2013-07-12 NOTE — Progress Notes (Signed)
[facility] Progress Note   Subjective: Feeling better  Filed Vitals:   07/11/13 2023 07/12/13 0441 07/12/13 0745 07/12/13 1400  BP: 136/66 144/69 135/68 125/78  Pulse: 80 83 90 87  Temp: 99 F (37.2 C) 98.5 F (36.9 C) 98.5 F (36.9 C) 98.4 F (36.9 C)  TempSrc: Oral Oral Oral Oral  Resp: 18 18 18 18   Height:      Weight: 81.903 kg (180 lb 9 oz)     SpO2: 94% 97% 97% 98%  Exam Alert, no distress No jvd Chest clear bilat Abd soft, nt, nd RRR no MRG No leg or UE edema R FA AVF patent, smallish  D[facility]: [facility]   81.5kg  2K/2.25 Bath  Heparni 2500   R AVF Hectorol none     Epo 20K     Venofer none   Na thiosulfate 25 gm/hd IV Recent lab: Hb 10.9 tsat 24% ferritin 1830 phos 3.3  pth 208   Assessment: 1 Syncope- after PD cath removal surgery, due to #2 2 Anemia due to ABL- s/p 2u prbc's on 2/21, improved/stable 3 PD cath removal 2/20 4 ESRD on hemodialysis- no heparin HD for now 5 Calciphylaxis / bilat LE wounds- on Na thio w HD, midodrine stopped due to concerns about blood flow to feet; on IV abx per primary w IV vanc 6 2HPT- cont binders 7 DM on insulin    Plan- HD tomorrow 1st shift, no heparin, UF 2-3 kg    Kelly Splinter MD  pager [phone]    cell [phone]  07/12/2013, 4:37 PM     Recent Labs Lab 07/10/13 0358 07/11/13 0831 07/12/13 0645  NA 141 139 139  K 4.2 4.6 4.5  CL 98 98 99  CO2 27 24 26   GLUCOSE 208* 100* 67*  BUN 47* 35* 53*  CREATININE 6.67* 4.70* 6.28*  CALCIUM 7.7* 7.8* 7.8*  PHOS  --  4.1 3.9    Recent Labs Lab 07/11/13 0831 07/12/13 0645  ALBUMIN 1.5* 1.5*    Recent Labs Lab 07/09/13 1759  07/10/13 1420 07/11/13 0831 07/12/13 0645  WBC 19.3*  < > 10.3 15.4* 18.3*  NEUTROABS 16.4*  --   --   --   --   HGB 10.1*  < > 8.2* 10.2* 10.0*  HCT 32.8*  < > 26.4* 33.1* 32.4*  MCV 95.1  < > 94.0 92.5 92.8  PLT 333  < > 275 234 287  < > = values in this interval not displayed. [name] aspirin EC  81 mg  Oral Daily  . atorvastatin  10 mg Oral q1800  . collagenase   Topical Daily  . darbepoetin (ARANESP) injection - DIALYSIS  150 mcg Intravenous Q Sat-HD  . gabapentin  300 mg Oral QHS  . heparin  5,000 Units Subcutaneous 3 times per day  . insulin aspart  0-15 Units Subcutaneous TID WC  . insulin glargine  10 Units Subcutaneous QHS  . insulin glargine  5 Units Subcutaneous Daily  . multivitamin  1 tablet Oral QHS  . [START ON 07/13/2013] nitroGLYCERIN  0.1 mg Transdermal Daily  . nitroGLYCERIN  0.1 mg Transdermal Daily  . pantoprazole  80 mg Oral Daily  . sertraline  50 mg Oral Daily  . sevelamer carbonate  1,600 mg Oral TID WC  . sodium chloride  3 mL Intravenous Q12H  . sodium thiosulfate  25 g Intravenous Q T,Th,Sa-HD  . [START ON 07/13/2013] vancomycin  1,000 mg Intravenous Q T,Th,Sa-HD  acetaminophen, HYDROcodone-acetaminophen, oxyCODONE-acetaminophen

## 2013-07-12 NOTE — Progress Notes (Signed)
Hypoglycemic Event  CBG: 66  Treatment: 15 GM carbohydrate snack  Symptoms: None  Follow-up CBG: [id number] CBG Result:79  Possible Reasons for Event: Unknown  Comments/MD notified: Dr. Romie Minus, Rayona Sardinha C  Remember to initiate Hypoglycemia Order Set & complete

## 2013-07-12 NOTE — Progress Notes (Signed)
ANTIBIOTIC CONSULT NOTE - INITIAL  Pharmacy Consult for Vancomycin Indication: cellulitis  Allergies  Allergen Reactions  . Metformin And Related Other (See Comments)    Kidney problems    Patient Measurements: Height: 6\' 2"  (188 cm) Weight: 180 lb 9 oz (81.903 kg) IBW/kg (Calculated) : 82.2  Vital Signs: Temp: 98.5 F (36.9 C) (02/23 0745) Temp src: Oral (02/23 0745) BP: 135/68 mmHg (02/23 0745) Pulse Rate: 90 (02/23 0745) Intake/Output from previous day: 02/22 0701 - 02/23 0700 In: 600 [P.O.:600] Out: -  Intake/Output from this shift: Total I/O In: 240 [P.O.:240] Out: -   Labs:  Recent Labs  07/10/13 0358 07/10/13 1420 07/11/13 0831 07/12/13 0645  WBC 18.0* 10.3 15.4* 18.3*  HGB 8.5* 8.2* 10.2* 10.0*  PLT 305 275 234 287  CREATININE 6.67*  --  4.70* 6.28*   Estimated Creatinine Clearance: 14.7 ml/min (by C-G formula based on Cr of 6.28).  ESRD with HD on TTS   Microbiology: Recent Results (from the past 720 hour(s))  MRSA PCR SCREENING     Status: None   Collection Time    07/09/13  9:09 PM      Result Value Ref Range Status   MRSA by PCR NEGATIVE  NEGATIVE Final   Comment:            The GeneXpert MRSA Assay (FDA     approved for NASAL specimens     only), is one component of a     comprehensive MRSA colonization     surveillance program. It is not     intended to diagnose MRSA     infection nor to guide or     monitor treatment for     MRSA infections.    Medical History: Past Medical History  Diagnosis Date  . Diabetic neuropathy   . Cancer     History of Duodenal Cancer  . Chronic kidney disease     Pt is not eligible for transplant per [facility]  . Anemia     esrd  . Hypertension     meds d/c 'd  2 wks ago for postural hypotension  . Fatigue      tires easily x 2 months  . Chronic nausea     with  morning vomiting; better off  HTN meds  . Intermittent tremor   . Closed left arm fracture 1967  . Heart murmur   . Corneal abrasion,  left   . Shortness of breath     exertional  . GERD (gastroesophageal reflux disease)   . History of blood transfusion   . Lack of coordination   . Muscle weakness (generalized)   . Glomerulosclerosis, diabetic   . Depression   . A-fib   . Critical lower limb ischemia   . SIRS (systemic inflammatory response syndrome) 04/02/2013  . Calciphylaxis 05/2013    lower ext  . Dysrhythmia     Afib  . Diabetes mellitus     Type 1 iddm x 11 yrs    Medications:  Scheduled:  . aspirin EC  81 mg Oral Daily  . atorvastatin  10 mg Oral q1800  . collagenase   Topical Daily  . darbepoetin (ARANESP) injection - DIALYSIS  150 mcg Intravenous Q Sat-HD  . gabapentin  300 mg Oral QHS  . heparin  5,000 Units Subcutaneous 3 times per day  . insulin aspart  0-15 Units Subcutaneous TID WC  . insulin glargine  10 Units Subcutaneous QHS  . insulin glargine  5 Units Subcutaneous Daily  . multivitamin  1 tablet Oral QHS  . nitroGLYCERIN  0.1 mg Transdermal Daily  . nitroGLYCERIN  0.1 mg Transdermal Daily  . pantoprazole  80 mg Oral Daily  . sertraline  50 mg Oral Daily  . sevelamer carbonate  1,600 mg Oral TID WC  . sodium chloride  3 mL Intravenous Q12H  . sodium thiosulfate  25 g Intravenous Q T,Th,Sa-HD   Assessment: 58 yo M admitted with snycope secondary to orthostatis s/p oupt removal of PD cath.  Pt was found to have elevated WBC and bilateral LE cellulitis/areas of open wounds thought 2/2 calciphylaxis.  Pt on HD TTS with next HD due 2/24.  Goal of Therapy:  Target pre-HD level 15-25 mcg/ml  Plan:  Vancomycin 1750 mg IV x 1 now Vancomycin 1000 mg IV with each HD session (TTS) Will follow-up clinical progress and check Vancomycin level as indicated.  [place]iana Gamero, Rocky Crafts 07/12/2013,10:52 AM

## 2013-07-12 NOTE — Progress Notes (Signed)
Pt is eligible for hyperbaric therapy for his legs at [facility], but his insurance is requesting a L foot xray to be completed prior to insurance authorization for treatment. Dr. Judene Companion at [facility] requested that the L foot xray be completed while pt is here in the hospital (xray has to be completed at [facility] facility). Dr. Jerline Pain faxed over a written order request (he does not have EPIC for orders, just billing), which was placed in the paper chart. An electronic order was placed in EPIC to reflect his request. Any further questions should be directed to Dr. Jerline Pain, p: [phone].

## 2013-07-12 NOTE — Plan of Care (Signed)
Problem: Consults Goal: Skin Care Protocol Initiated - if Braden Score 18 or less If consults are not indicated, leave blank or document N/A  Outcome: Progressing WOC consulted for BLE wounds

## 2013-07-13 ENCOUNTER — Ambulatory Visit: Payer: Managed Care, Other (non HMO) | Admitting: Physical Therapy

## 2013-07-13 DIAGNOSIS — I517 Cardiomegaly: Secondary | ICD-10-CM

## 2013-07-13 LAB — CBC
HEMATOCRIT: 30.6 % — AB (ref 39.0–52.0)
Hemoglobin: 9.7 g/dL — ABNORMAL LOW (ref 13.0–17.0)
MCH: 29.2 pg (ref 26.0–34.0)
MCHC: 31.7 g/dL (ref 30.0–36.0)
MCV: 92.2 fL (ref 78.0–100.0)
Platelets: 282 10*3/uL (ref 150–400)
RBC: 3.32 MIL/uL — ABNORMAL LOW (ref 4.22–5.81)
RDW: 17.1 % — AB (ref 11.5–15.5)
WBC: 18.3 10*3/uL — AB (ref 4.0–10.5)

## 2013-07-13 LAB — RENAL FUNCTION PANEL
ALBUMIN: 1.5 g/dL — AB (ref 3.5–5.2)
BUN: 72 mg/dL — ABNORMAL HIGH (ref 6–23)
CALCIUM: 7.9 mg/dL — AB (ref 8.4–10.5)
CHLORIDE: 99 meq/L (ref 96–112)
CO2: 25 mEq/L (ref 19–32)
CREATININE: 7.73 mg/dL — AB (ref 0.50–1.35)
GFR calc Af Amer: 8 mL/min — ABNORMAL LOW (ref >90)
GFR calc non Af Amer: 7 mL/min — ABNORMAL LOW (ref >90)
Glucose, Bld: 108 mg/dL — ABNORMAL HIGH (ref 70–99)
Phosphorus: 5.1 mg/dL — ABNORMAL HIGH (ref 2.3–4.6)
Potassium: 4.7 mEq/L (ref 3.7–5.3)
Sodium: 140 mEq/L (ref 137–147)

## 2013-07-13 LAB — GLUCOSE, CAPILLARY: Glucose-Capillary: 146 mg/dL — ABNORMAL HIGH (ref 70–99)

## 2013-07-13 MED ORDER — SODIUM THIOSULFATE 25 % IV SOLN
25.0000 g | INTRAVENOUS | Status: DC
  Administered 2013-07-13: 25 g via INTRAVENOUS
  Filled 2013-07-13: qty 100

## 2013-07-13 MED ORDER — OXYCODONE-ACETAMINOPHEN 5-325 MG PO TABS
ORAL_TABLET | ORAL | Status: AC
  Filled 2013-07-13: qty 2

## 2013-07-13 MED ORDER — UNABLE TO FIND: Status: DC

## 2013-07-13 MED ORDER — VANCOMYCIN HCL IN DEXTROSE 1-5 GM/200ML-% IV SOLN: 1000.0000 mg | INTRAVENOUS | Status: DC

## 2013-07-13 NOTE — Progress Notes (Signed)
  Echocardiogram 2D Echocardiogram has been performed.  Diamond Nickel 07/13/2013, 3:29 PM

## 2013-07-13 NOTE — Procedures (Signed)
I was present at this dialysis session, have reviewed the session itself and made  appropriate changes  Kelly Splinter MD (pgr) [phone]    ([phone] 07/13/2013, 8:51 AM

## 2013-07-13 NOTE — Discharge Summary (Addendum)
Physician Discharge Summary  Paul [name] [id number] DOB: 05-Mar-1956 DOA: 07/09/2013  PCP: [name], MD  Admit date: 07/09/2013 Discharge date: 07/13/2013  Recommendations for Outpatient Follow-up:  1. Pt will need to follow up with PCP in 1 weeks post discharge 2. Please also check CBC on 07/15/13 on HD 3. Continue Vancomycin 750mg  IV on Tues-Thurs-Sat on dialysis with last dose on 07/24/13 4. Please follow up on echocardiogram results  Discharge Diagnoses:  Syncope  -Secondary to orthostasis from blood loss anemia after removal of peritoneal dialysis catheter  -Echocardiogram pending--please follow up on results -carotid doppler neg  -TSH 2.188  -EKG sinus rhythm without any ST-T wave changes  -Telemetry with occasional PACs, PVCs--remains in sinus rhythm  -Patient had positive orthostatics in the emergency department--this has been a chronic problem for the patient  Cellulitis bilateral lower extremities  -This likely accounts for the patient's leukocytosis  -Start intravenous vancomycin  -Etiology is due to open wounds from the patient's calciphylaxis  -There was mild improvement after one day of IV antibiotics -Wbc's stabilized--although remains elevated, the patient is afebrile and hemodynamically and clinically stable -Patient stated that he wanted to go home and followup with his vascular surgeon in [name], [place] on 07/14/13 -I told the patient that it would be important for him to followup with his primary care provider to have a CBC rechecked on 07/15/2013 to ensure that his WBC count is continuing to decrease -If the patient experiences any fever or clinical worsening of his legs with erythema or pain, he should come to the emergency department ASAP -X-ray of his left foot was obtained as part of his protocol for hyperbaric therapy for his wound care physician, Dr.Parker -He will receive vancomycin 750 mg IV with each dialysis, last dose to BE given on 07/24/2013 which  will eventually complete 14 days of antibiotic therapy. Diabetes mellitus type 2  -Hemoglobin A1c 6.7 on 05/25/2013  -Continue home insulin regimen  -NovoLog sliding scale  Calciphylaxis with dry gangrene of the bilateral feet  -Continue nitroglycerin patches  -Local wound care  -Continue sodium thiosulfate on dialysis  ESRD  -Appreciate nephrology followup  Anemia  -Transfused 2 units PRBCs 07/10/2013 -Patient had drop in hemoglobin secondary to blood loss anemia after removal of his PD catheter  -Hemoglobin remained stable after transfusion History of atrial fibrillation  -Remains in sinus rhythm  -Not on Coumadin secondary to concerns for Coumadin necrosis  Family Communication: Pt at beside  Disposition Plan: Home 07/13/13 AFTER HD if stable  Antibiotics:  Vancomycin 07/12/13>>>>07/24/13   Discharge Condition: Stable  Disposition:  discharge home  Diet: Renal Wt Readings from Last 3 Encounters:  07/13/13 83.5 kg (184 lb 1.4 oz)  07/09/13 83.099 kg (183 lb 3.2 oz)  07/09/13 83.099 kg (183 lb 3.2 oz)    History of present illness:  58 yo Male who presents after a syncopal episode. Patient was riding home in passenger seat after having his peritoneal dialysis catheter removed this morning. When he went to get out of the car (stood up) he had LOC. He denies any palpitations, CP, ABD pain, HA, or SOB prior to the event. He states he has been npo since last night and felt weak. His wife witnessed the LOC and caught him preventing him from falling. When EMS arrived he was 98/76. FSBS 115. His wife denies witnessed any seizure like activity.  Patient denies HA prior to syncope and did not have HA during the hospitalization. Orthostatics were positive in the emergency  department. He was felt that his syncope may be related to orthostasis from acute blood loss from the surgery. The patient was transfused 2 units PRBCs. His hemoglobin remained stable throughout the hospitalization. He was  noted to have leukocytosis. The patient was started on vancomycin with stabilization of his WBC. He remained afebrile and hemodynamically stable throughout the hospitalization. The patient stated that he wanted to be discharged and not wait any longer to monitor response for his WBCs as he had an appointment with his vascular surgeon in [facility] on 07/14/2013. The patient was discharged in stable condition with instructions to obtain a CBC on 07/15/2013 on dialysis. If his WBC increased this or he experience any fever or worsening of his lower extremities with regard to increasing drainage, erythema, or pain he should report to the emergency department ASAP.    Consultants: Nephrology  Discharge Exam: Filed Vitals:   07/13/13 0830  BP: 160/90  Pulse: 84  Temp:   Resp:    Filed Vitals:   07/13/13 0712 07/13/13 0730 07/13/13 0800 07/13/13 0830  BP: 150/84 158/84 166/87 160/90  Pulse: 77 80 83 84  Temp:      TempSrc:      Resp:      Height:      Weight:      SpO2:       General: A&O x 3, NAD, pleasant, cooperative Cardiovascular: RRR, no rub, no gallop, no S3 Respiratory: CTAB, no wheeze, no rhonchi Abdomen:soft, nontender, nondistended, positive bowel sounds Extremities: No edema, bilateral dry gangrene of the great toes. There is erythema on the bilateral dorsal feet streaking up to the ankle area. No crepitance. He has scattered dry eschar on wounds on the bilateral lower extremities below the knees without any crepitance or purulent drainage.   Discharge Instructions  Discharge Orders   Future Appointments Provider Department Dept Phone   07/15/2013 9:30 AM Einar Grad [address] [phone]   07/21/2013 10:45 AM Adin Hector, MD [facility], [place]   08/06/2013 9:00 AM [name], MD [facility] [phone]   Future Orders Complete By Expires   Increase  activity slowly  As directed        Medication List         acetaminophen 500 MG tablet  Commonly known as:  TYLENOL  Take 1,000 mg by mouth 2 (two) times daily as needed (pain).     aspirin EC 81 MG tablet  Take 1 tablet (81 mg total) by mouth daily.     atorvastatin 10 MG tablet  Commonly known as:  LIPITOR  Take 1 tablet (10 mg total) by mouth daily.     gabapentin 300 MG capsule  Commonly known as:  NEURONTIN  Take 1 capsule (300 mg total) by mouth at bedtime.     HYDROcodone-acetaminophen 5-325 MG per tablet  Commonly known as:  NORCO/VICODIN  Take 2 tablets by mouth 2 (two) times daily as needed for moderate pain.     LANTUS SOLOSTAR 100 UNIT/ML Solostar Pen  Generic drug:  Insulin Glargine  Inject 5-10 Units into the skin 2 (two) times daily. Inject 5 units every morning and 10 units every night     multivitamin Tabs tablet  Take 1 tablet by mouth at bedtime.     nitroGLYCERIN 0.1 mg/hr patch  Commonly known as:  NITRODUR - Dosed in mg/24 hr  Place 0.2 mg onto the skin daily. Leave  on for 12 hours, then remove for 12 hours     omeprazole 40 MG capsule  Commonly known as:  PRILOSEC  Take 1 capsule (40 mg total) by mouth daily.     oxyCODONE-acetaminophen 5-325 MG per tablet  Commonly known as:  PERCOCET/ROXICET  Take 1-2 tablets by mouth every 4 (four) hours as needed (pain).     SANTYL ointment  Generic drug:  collagenase  Apply 1 application topically daily.     sertraline 50 MG tablet  Commonly known as:  ZOLOFT  Take 50 mg by mouth daily.     sevelamer carbonate 800 MG tablet  Commonly known as:  RENVELA  Take 2 tablets (1,600 mg total) by mouth 3 (three) times daily with meals.     triamcinolone cream 0.1 %  Commonly known as:  KENALOG  Apply 1 application topically daily as needed (pain).     UNABLE TO FIND  Mr. Constandinos Deeds is medically stable to resume physical therapy     vancomycin 1 GM/200ML Soln  Commonly known as:  VANCOCIN  Inject  200 mLs (1,000 mg total) into the vein Every Tuesday,Thursday,and Saturday with dialysis. Last dose to be given on HD on 07/24/13         The results of significant diagnostics from this hospitalization (including imaging, microbiology, ancillary and laboratory) are listed below for reference.    Significant Diagnostic Studies: Dg Chest 2 View  07/09/2013   CLINICAL DATA:  Syncope.  Fall.  EXAM: CHEST  2 VIEW  COMPARISON:  05/24/2013  FINDINGS: The heart size and mediastinal contours are within normal limits. Both lungs are clear. The visualized skeletal structures are unremarkable.  IMPRESSION: No active cardiopulmonary disease.   Electronically Signed   By: Earle Gell M.D.   On: 07/09/2013 20:00   Dg Shoulder Left  07/09/2013   CLINICAL DATA:  Left shoulder pain.  EXAM: LEFT SHOULDER - 2+ VIEW  COMPARISON:  None.  FINDINGS: There is no evidence of fracture or dislocation. There is no evidence of arthropathy or other focal bone abnormality. Soft tissues are unremarkable.  IMPRESSION: Negative.   Electronically Signed   By: Earle Gell M.D.   On: 07/09/2013 20:07   Dg Foot 2 Views Left  07/13/2013   CLINICAL DATA:  Left foot ulcers/wounds. Evaluate for osteomyelitis.  EXAM: LEFT FOOT - 2 VIEW  COMPARISON:  None.  FINDINGS: No radiographic evidence of osteomyelitis noted.  There is no evidence of fracture, subluxation or dislocation.  Heavy vascular calcifications are identified.  No radiopaque foreign bodies are present.  IMPRESSION: No evidence of acute bony abnormality. No radiographic evidence of osteomyelitis.   Electronically Signed   By: Hassan Rowan M.D.   On: 07/13/2013 01:50     Microbiology: Recent Results (from the past 240 hour(s))  MRSA PCR SCREENING     Status: None   Collection Time    07/09/13  9:09 PM      Result Value Ref Range Status   MRSA by PCR NEGATIVE  NEGATIVE Final   Comment:            The GeneXpert MRSA Assay (FDA     approved for NASAL specimens     only), is  one component of a     comprehensive MRSA colonization     surveillance program. It is not     intended to diagnose MRSA     infection nor to guide or     monitor treatment  for     MRSA infections.     Labs: Basic Metabolic Panel:  Recent Labs Lab 07/09/13 1701 07/10/13 0358 07/11/13 0831 07/12/13 0645 07/13/13 0535  NA 139 141 139 139 140  K 4.8 4.2 4.6 4.5 4.7  CL 96 98 98 99 99  CO2 23 27 24 26 25   GLUCOSE 139* 208* 100* 67* 108*  BUN 39* 47* 35* 53* 72*  CREATININE 5.76* 6.67* 4.70* 6.28* 7.73*  CALCIUM 8.1* 7.7* 7.8* 7.8* 7.9*  PHOS  --   --  4.1 3.9 5.1*   Liver Function Tests:  Recent Labs Lab 07/11/13 0831 07/12/13 0645 07/13/13 0535  ALBUMIN 1.5* 1.5* 1.5*   No results found for this basename: LIPASE, AMYLASE,  in the last 168 hours No results found for this basename: AMMONIA,  in the last 168 hours CBC:  Recent Labs Lab 07/09/13 1759 07/10/13 0358 07/10/13 1420 07/11/13 0831 07/12/13 0645 07/13/13 0535  WBC 19.3* 18.0* 10.3 15.4* 18.3* 18.3*  NEUTROABS 16.4*  --   --   --   --   --   HGB 10.1* 8.5* 8.2* 10.2* 10.0* 9.7*  HCT 32.8* 27.8* 26.4* 33.1* 32.4* 30.6*  MCV 95.1 95.2 94.0 92.5 92.8 92.2  PLT 333 305 275 234 287 282   Cardiac Enzymes:  Recent Labs Lab 07/10/13 0546 07/10/13 1151  TROPONINI <0.30 <0.30   BNP: No components found with this basename: POCBNP,  CBG:  Recent Labs Lab 07/12/13 0809 07/12/13 0842 07/12/13 1157 07/12/13 1633 07/12/13 2050  GLUCAP 66* 79 134* 93 98    Time coordinating discharge:  Greater than 30 minutes  Signed:  Virgene Tirone, Rik, DO [facility] Pager: [phone] 07/13/2013, 9:07 AM

## 2013-07-13 NOTE — Progress Notes (Signed)
  [facility] Progress Note   Subjective: No complaints  Filed Vitals:   07/13/13 0712 07/13/13 0730 07/13/13 0800 07/13/13 0830  BP: 150/84 158/84 166/87 160/90  Pulse: 77 80 83 84  Temp:      TempSrc:      Resp:      Height:      Weight:      SpO2:      Exam Alert, no distress No jvd Chest clear bilat Abd soft, nt, nd RRR no MRG No leg or UE edema R FA AVF patent, smallish  D[facility]: [facility]   81.5kg  2K/2.25 [place]  Heparni 2500   R AVF Hectorol none     Epo 20K     Venofer none   Na thiosulfate 25 gm/hd IV Recent lab: Hb 10.9 tsat 24% ferritin 1830 phos 3.3  pth 208   Assessment: 1 Syncope- after PD cath removal surgery, due to #2 2 Anemia due to ABL- s/p 2u prbc's on 2/21, improved/stable 3 PD cath removal 2/20 4 ESRD on hemodialysis- no heparin HD for now 5 Calciphylaxis / bilat LE wounds- on Na thiosulfate w HD; on IV abx per primary w IV vanc 6 2HPT- cont binders 7 DM on insulin    Plan- HD today, UF 2-3 kg as Ronnie Derby MD  pager [phone]    cell [phone]  07/13/2013, 8:52 AM     Recent Labs Lab 07/11/13 0831 07/12/13 0645 07/13/13 0535  NA 139 139 140  K 4.6 4.5 4.7  CL 98 99 99  CO2 24 26 25   GLUCOSE 100* 67* 108*  BUN 35* 53* 72*  CREATININE 4.70* 6.28* 7.73*  CALCIUM 7.8* 7.8* 7.9*  PHOS 4.1 3.9 5.1*    Recent Labs Lab 07/11/13 0831 07/12/13 0645 07/13/13 0535  ALBUMIN 1.5* 1.5* 1.5*    Recent Labs Lab 07/09/13 1759  07/11/13 0831 07/12/13 0645 07/13/13 0535  WBC 19.3*  < > 15.4* 18.3* 18.3*  NEUTROABS 16.4*  --   --   --   --   HGB 10.1*  < > 10.2* 10.0* 9.7*  HCT 32.8*  < > 33.1* 32.4* 30.6*  MCV 95.1  < > 92.5 92.8 92.2  PLT 333  < > 234 287 282  < > = values in this interval not displayed. [name] aspirin EC  81 mg Oral Daily  . atorvastatin  10 mg Oral q1800  . collagenase   Topical Daily  . darbepoetin (ARANESP) injection - DIALYSIS  150 mcg Intravenous Q Sat-HD  . gabapentin  300  mg Oral QHS  . heparin  5,000 Units Subcutaneous 3 times per day  . insulin aspart  0-15 Units Subcutaneous TID WC  . insulin glargine  10 Units Subcutaneous QHS  . insulin glargine  5 Units Subcutaneous Daily  . multivitamin  1 tablet Oral QHS  . nitroGLYCERIN  0.1 mg Transdermal Daily  . nitroGLYCERIN  0.1 mg Transdermal Daily  . pantoprazole  80 mg Oral Daily  . sertraline  50 mg Oral Daily  . sevelamer carbonate  1,600 mg Oral TID WC  . sodium chloride 0.9 % 100 mL with sodium thiosulfate 25 g  25 g Intravenous Q T,Th,Sa-HD  . sodium chloride  3 mL Intravenous Q12H  . vancomycin  1,000 mg Intravenous Q T,Th,Sa-HD     acetaminophen, HYDROcodone-acetaminophen, oxyCODONE-acetaminophen

## 2013-07-13 NOTE — Progress Notes (Signed)
Inpatient Diabetes Program Recommendations  AACE/ADA: New Consensus Statement on Inpatient Glycemic Control (2013)  Target Ranges:  Prepandial:   less than 140 mg/dL      Peak postprandial:   less than 180 mg/dL (1-2 hours)      Critically ill patients:  140 - 180 mg/dL     Results for Paul [name], Paul [name] (MRN [id number]) as of 07/13/2013 08:59  Ref. Range 07/12/2013 07:56 07/12/2013 08:09 07/12/2013 08:42 07/12/2013 11:57 07/12/2013 16:33 07/12/2013 20:50  Glucose-Capillary Latest Range: 70-99 mg/dL 66 (L) 66 (L) 79 134 (H) 93 98    Diabetes history: Type 1 DM x 11 years Outpatient Diabetes medications: Lantus 5 units AM & 10 units in the PM Current orders for Inpatient glycemic control: Home Lantus + Novolog Moderate SSI tid   **Hypoglycemic yesterday morning.  Currently getting home doses of Lantus.  **RN held 10 units Lantus that was scheduled for last night (family refused).  Patient for dialysis today.   **MD- If patient continues to have issues with hypoglycemia, may want to reduce Lantus to 5 units bid (currently ordered as 5 units AM and 10 units PM)   Will follow. Wyn Quaker RN, MSN, CDE Diabetes Coordinator Inpatient Diabetes Program Team Pager: [phone] (8a-10p)

## 2013-07-15 ENCOUNTER — Ambulatory Visit: Payer: Managed Care, Other (non HMO) | Admitting: Physical Therapy

## 2013-07-19 ENCOUNTER — Encounter (HOSPITAL_BASED_OUTPATIENT_CLINIC_OR_DEPARTMENT_OTHER): Payer: Managed Care, Other (non HMO) | Attending: General Surgery

## 2013-07-19 DIAGNOSIS — E1159 Type 2 diabetes mellitus with other circulatory complications: Secondary | ICD-10-CM | POA: Insufficient documentation

## 2013-07-19 DIAGNOSIS — I96 Gangrene, not elsewhere classified: Secondary | ICD-10-CM | POA: Insufficient documentation

## 2013-07-19 DIAGNOSIS — L97509 Non-pressure chronic ulcer of other part of unspecified foot with unspecified severity: Secondary | ICD-10-CM | POA: Insufficient documentation

## 2013-07-20 LAB — GLUCOSE, CAPILLARY
GLUCOSE-CAPILLARY: 244 mg/dL — AB (ref 70–99)
GLUCOSE-CAPILLARY: 235 mg/dL — AB (ref 70–99)
GLUCOSE-CAPILLARY: 169 mg/dL — AB (ref 70–99)
GLUCOSE-CAPILLARY: 161 mg/dL — AB (ref 70–99)

## 2013-07-21 ENCOUNTER — Ambulatory Visit (INDEPENDENT_AMBULATORY_CARE_PROVIDER_SITE_OTHER): Payer: Managed Care, Other (non HMO) | Admitting: Surgery

## 2013-07-21 ENCOUNTER — Encounter (INDEPENDENT_AMBULATORY_CARE_PROVIDER_SITE_OTHER): Payer: Self-pay | Admitting: Surgery

## 2013-07-21 VITALS — BP 118/74 | HR 74 | Temp 98.3°F | Ht 74.0 in | Wt 181.0 lb

## 2013-07-21 DIAGNOSIS — Z992 Dependence on renal dialysis: Secondary | ICD-10-CM

## 2013-07-21 DIAGNOSIS — N186 End stage renal disease: Secondary | ICD-10-CM

## 2013-07-21 DIAGNOSIS — T85611A Breakdown (mechanical) of intraperitoneal dialysis catheter, initial encounter: Secondary | ICD-10-CM

## 2013-07-21 DIAGNOSIS — T85691A Other mechanical complication of intraperitoneal dialysis catheter, initial encounter: Secondary | ICD-10-CM

## 2013-07-21 LAB — GLUCOSE, CAPILLARY
GLUCOSE-CAPILLARY: 162 mg/dL — AB (ref 70–99)
GLUCOSE-CAPILLARY: 198 mg/dL — AB (ref 70–99)

## 2013-07-21 NOTE — Progress Notes (Signed)
Subjective:     Patient ID: Paul [name], male   DOB: [birth year], 57 y.o.   MRN: [id number]  HPI  Note: This dictation was prepared with Dragon/digital dictation along with [facility] technology. Any transcriptional errors that result from this process are unintentional.       Paul [name]  1956-03-20 [id number]  Patient Care Team: [name], MD as PCP - General (Family Medicine) Lucrezia Starch, MD (Nephrology) Ladell Pier, MD as Attending Physician (Internal Medicine) Angelia Mould, MD as Consulting Physician (Vascular Surgery)  Procedure (Date: 07/09/2013):  POST-OPERATIVE DIAGNOSIS: Poor tolerance of peritoneal dialysis caterter   PROCEDURE:  CONTINUOUS AMBULATORY PERITONEAL DIALYSIS (CAPD) CATHETER REMOVAL  SURGEON: Surgeon(s):  Adin Hector, MD    This patient returns for surgical re-evaluation.  Because he did not like the hassle of having it, I removed his CAPD catheter a few weeks ago.  Easy case.  Minimal bleeding.   Apparently came back to the emergency room with near syncope and low blood pressure.  Hemoglobin dropped one point.  They wondered about bleeding.  Admitted and underwent hemodialysis.  I was never called.  As far as I can gather, neither were my partners.  He feels okay right now.  He is walking with a walker.  Still suffers with significant foot ulcers.  Getting aggressive wound care.  Following up with a vascular surgeon in [place] who tried to do some micro-anastomoses.  He denies any fevers or chills.  No nausea or vomiting.  He comes today with his wife.  No dizziness or lightheadedness.  Patient Active Problem List   Diagnosis Date Noted  . Bilateral cellulitis of lower leg 07/12/2013  . Acute blood loss anemia 07/12/2013  . Syncope 07/09/2013  . Intolerance to CAPD peritoneal dialysis  07/05/2013  . Atherosclerosis of native arteries of the extremities with ulceration(440.23) 06/17/2013  . Gait disorder 05/31/2013  .  Weakness 05/24/2013  . Hypokalemia 04/04/2013  . Depression 04/03/2013  . GERD (gastroesophageal reflux disease) 04/03/2013  . Atrial fibrillation 04/02/2013  . Hypotension 04/02/2013  . DM (diabetes mellitus) 12/28/2012  . Other complications due to renal dialysis device, implant, and graft 05/19/2012  . Anemia 07/02/2011  . CKD (chronic kidney disease) stage V requiring chronic dialysis 05/29/2011    Past Medical History  Diagnosis Date  . Diabetic neuropathy   . Cancer     History of Duodenal Cancer  . Chronic kidney disease     Pt is not eligible for transplant per [facility]  . Anemia     esrd  . Hypertension     meds d/c 'd  2 wks ago for postural hypotension  . Fatigue      tires easily x 2 months  . Chronic nausea     with  morning vomiting; better off  HTN meds  . Intermittent tremor   . Closed left arm fracture 1967  . Heart murmur   . Corneal abrasion, left   . Shortness of breath     exertional  . GERD (gastroesophageal reflux disease)   . History of blood transfusion   . Lack of coordination   . Muscle weakness (generalized)   . Glomerulosclerosis, diabetic   . Depression   . A-fib   . Critical lower limb ischemia   . SIRS (systemic inflammatory response syndrome) 04/02/2013  . Calciphylaxis 05/2013    lower ext  . Dysrhythmia     Afib  . Diabetes mellitus  Type 1 iddm x 11 yrs    Past Surgical History  Procedure Laterality Date  . Whipple procedure  2002    duodenal ca, Dr. Harlow Asa  . Bone marrow biopsy  2012  . Renal biopsy, percutaneous  2012  . Tonsillectomy    . Portacath placement  2002  . Biliary diversion, external  2002  . Other surgical history      Cyst removed from back  . Av fistula placement  06/07/2011    Procedure: ARTERIOVENOUS (AV) FISTULA CREATION;  Surgeon: Angelia Mould, MD;  Location: [place];  Service: Vascular;  Laterality: Left;  . Ligation of arteriovenous  fistula  05/22/2012    Procedure: LIGATION OF ARTERIOVENOUS   FISTULA;  Surgeon: Mal Misty, MD;  Location: [place];  Service: Vascular;  Laterality: Left;  Left brachio-cephalic fistula  . Insertion of dialysis catheter  05/22/2012    Procedure: INSERTION OF DIALYSIS CATHETER;  Surgeon: Mal Misty, MD;  Location: [place];  Service: Vascular;  Laterality: N/A;  right internal jugular vein  . Av fistula placement  06/18/2012    Procedure: ARTERIOVENOUS (AV) FISTULA CREATION;  Surgeon: Mal Misty, MD;  Location: [place];  Service: Vascular;  Laterality: Right;  [place]  . Colonoscopy      Hx: of  . Capd insertion N/A 01/14/2013    Procedure: LAPAROSCOPIC INSERTION CONTINUOUS AMBULATORY PERITONEAL DIALYSIS  (CAPD) CATHETER;  Surgeon: Adin Hector, MD;  Location: [place];  Service: General;  Laterality: N/A;  . Vascular surgery    . Removal of a dialysis catheter    . Capd removal N/A 07/09/2013    Procedure: CONTINUOUS AMBULATORY PERITONEAL DIALYSIS  (CAPD) CATHETER REMOVAL;  Surgeon: Adin Hector, MD;  Location: [place];  Service: General;  Laterality: N/A;    History   Social History  . Marital Status: Married    Spouse Name: N/A    Number of Children: N/A  . Years of Education: N/A   Occupational History  . Not on file.   Social History Main Topics  . Smoking status: Never Smoker   . Smokeless tobacco: Never Used  . Alcohol Use: No  . Drug Use: No  . Sexual Activity: Not on file   Other Topics Concern  . Not on file   Social History Narrative  . No narrative on file    Family History  Problem Relation Age of Onset  . Cancer Mother   . Cancer Father     prostate cancer  . Heart disease Maternal Grandfather   . Stroke Paternal Grandmother     Current Outpatient Prescriptions  Medication Sig Dispense Refill  . acetaminophen (TYLENOL) 500 MG tablet Take 1,000 mg by mouth 2 (two) times daily as needed (pain).      [name] aspirin EC 81 MG tablet Take 1 tablet (81 mg total) by mouth daily.      [name] atorvastatin (LIPITOR) 10 MG tablet  Take 1 tablet (10 mg total) by mouth daily.  30 tablet  1  . collagenase (SANTYL) ointment Apply 1 application topically daily.      [name] gabapentin (NEURONTIN) 300 MG capsule Take 1 capsule (300 mg total) by mouth at bedtime.  30 capsule  1  . HYDROcodone-acetaminophen (NORCO/VICODIN) 5-325 MG per tablet Take 2 tablets by mouth 2 (two) times daily as needed for moderate pain.      . Insulin Glargine (LANTUS SOLOSTAR) 100 UNIT/ML Solostar Pen Inject 5-10 Units into the skin  2 (two) times daily. Inject 5 units every morning and 10 units every night      . multivitamin (RENA-VIT) TABS tablet Take 1 tablet by mouth at bedtime.  30 tablet  0  . nitroGLYCERIN (NITRODUR - DOSED IN MG/24 HR) 0.1 mg/hr patch Place 0.2 mg onto the skin daily. Leave on for 12 hours, then remove for 12 hours      . omeprazole (PRILOSEC) 40 MG capsule Take 1 capsule (40 mg total) by mouth daily.  30 capsule  1  . oxyCODONE-acetaminophen (PERCOCET/ROXICET) 5-325 MG per tablet Take 1-2 tablets by mouth every 4 (four) hours as needed (pain).       [name] sertraline (ZOLOFT) 50 MG tablet Take 50 mg by mouth daily.      . sevelamer carbonate (RENVELA) 800 MG tablet Take 2 tablets (1,600 mg total) by mouth 3 (three) times daily with meals.  90 tablet  1  . triamcinolone cream (KENALOG) 0.1 % Apply 1 application topically daily as needed (pain).       . triamcinolone cream (KENALOG) 0.1 % Apply topically.      [name] UNABLE TO FIND Mr. Vu Liebman is medically stable to resume physical therapy  1 Act  0  . vancomycin (VANCOCIN) 1 GM/200ML SOLN Inject 200 mLs (1,000 mg total) into the vein Every Tuesday,Thursday,and Saturday with dialysis. Last dose to be given on HD on 07/24/13  200 mL  6   No current facility-administered medications for this visit.     Allergies  Allergen Reactions  . Metformin And Related Other (See Comments)    Kidney problems    BP 118/74  Pulse 74  Temp(Src) 98.3 F (36.8 C) (Oral)  Ht 6\' 2"  (1.88 m)  Wt 181 lb  (82.101 kg)  BMI 23.23 kg/m2  Dg Chest 2 View  07/09/2013   CLINICAL DATA:  Syncope.  Fall.  EXAM: CHEST  2 VIEW  COMPARISON:  05/24/2013  FINDINGS: The heart size and mediastinal contours are within normal limits. Both lungs are clear. The visualized skeletal structures are unremarkable.  IMPRESSION: No active cardiopulmonary disease.   Electronically Signed   By: 07/22/2013 M.D.   On: 07/09/2013 20:00   Dg Shoulder Left  07/09/2013   CLINICAL DATA:  Left shoulder pain.  EXAM: LEFT SHOULDER - 2+ VIEW  COMPARISON:  None.  FINDINGS: There is no evidence of fracture or dislocation. There is no evidence of arthropathy or other focal bone abnormality. Soft tissues are unremarkable.  IMPRESSION: Negative.   Electronically Signed   By: 07/11/2013 M.D.   On: 07/09/2013 20:07   Dg Foot 2 Views Left  07/13/2013   CLINICAL DATA:  Left foot ulcers/wounds. Evaluate for osteomyelitis.  EXAM: LEFT FOOT - 2 VIEW  COMPARISON:  None.  FINDINGS: No radiographic evidence of osteomyelitis noted.  There is no evidence of fracture, subluxation or dislocation.  Heavy vascular calcifications are identified.  No radiopaque foreign bodies are present.  IMPRESSION: No evidence of acute bony abnormality. No radiographic evidence of osteomyelitis.   Electronically Signed   By: 07/15/2013 M.D.   On: 07/13/2013 01:50     Review of Systems  Constitutional: Positive for fatigue. Negative for fever, chills, diaphoresis and unexpected weight change.  HENT: Negative for sore throat and trouble swallowing.   Eyes: Negative for photophobia and visual disturbance.  Respiratory: Negative for choking and shortness of breath.   Cardiovascular: Negative for chest pain and palpitations.  Gastrointestinal: Negative for  nausea, vomiting, abdominal distention, anal bleeding and rectal pain.  Genitourinary: Negative for genital sores and testicular pain.  Musculoskeletal: Positive for arthralgias and myalgias. Negative for gait problem and  neck pain.  Skin: Positive for wound. Negative for color change and rash.  Neurological: Negative for dizziness, speech difficulty, weakness and numbness.  Hematological: Negative for adenopathy. Bruises/bleeds easily.  Psychiatric/Behavioral: Negative for hallucinations, confusion and agitation.       Objective:   Physical Exam  Constitutional: He is oriented to person, place, and time. He appears well-developed and well-nourished. No distress.  HENT:  Head: Normocephalic.  Mouth/Throat: Oropharynx is clear and moist. No oropharyngeal exudate.  Eyes: Conjunctivae and EOM are normal. Pupils are equal, round, and reactive to light. No scleral icterus.  Neck: Normal range of motion. No tracheal deviation present.  Cardiovascular: Normal rate, normal heart sounds and intact distal pulses.   Pulmonary/Chest: Effort normal. No respiratory distress.  Abdominal: Soft. He exhibits no distension. There is no tenderness. Hernia confirmed negative in the right inguinal area and confirmed negative in the left inguinal area.  Incisions clean with normal healing ridges.  No hernias  Musculoskeletal: Normal range of motion. He exhibits no tenderness.  Gauze wraps bilateral feet.  Able to walk with walker slowly.  Neurological: He is alert and oriented to person, place, and time. No cranial nerve deficit. He exhibits normal muscle tone. Coordination normal.  Skin: Skin is warm and dry. No rash noted. He is not diaphoretic.  Psychiatric: His speech is normal and behavior is normal. Judgment normal. Cognition and memory are normal. He exhibits a depressed mood.  Continues his flat affect       Assessment:     Healing status post removal his peritoneal dialysis catheter.  Postoperative syncope with mild apical and dry.  Perhaps mild bleeding most likely secondary to fragile medical state.  Trying to rally.     Plan:     Increase activity as tolerated to regular activity.  Work with physical therapy  and wound care to help save his leg/feet per vascular surgery.  Low impact exercise such as walking an hour a day at least ideal.  Do not push through pain.  Diet as tolerated.  Low fat high fiber diet ideal.  Bowel regimen with 30 g fiber a day and fiber supplement as needed to avoid problems.  Return to clinic as needed.   Instructions discussed.  Followup with primary care physician for other health issues as would normally be done.  Consider screening for malignancies (breast, prostate, colon, melanoma, etc) as appropriate.  Questions answered.  The patient & his wife expressed understanding and appreciation

## 2013-07-21 NOTE — Patient Instructions (Signed)
GENERAL SURGERY: POST OP INSTRUCTIONS  1. DIET: Follow a light bland diet the first 24 hours after arrival home, such as soup, liquids, crackers, etc.  Be sure to include lots of fluids daily.  Avoid fast food or heavy meals as your are more likely to get nauseated.   2. Take your usually prescribed home medications unless otherwise directed. 3. PAIN CONTROL: a. Pain is best controlled by a usual combination of three different methods TOGETHER: i. Ice/Heat ii. Over the counter pain medication iii. Prescription pain medication b. Most patients will experience some swelling and bruising around the incisions.  Ice packs or heating pads (30-60 minutes up to 6 times a day) will help. Use ice for the first few days to help decrease swelling and bruising, then switch to heat to help relax tight/sore spots and speed recovery.  Some people prefer to use ice alone, heat alone, alternating between ice & heat.  Experiment to what works for you.  Swelling and bruising can take several weeks to resolve.   c. It is helpful to take an over-the-counter pain medication regularly for the first few weeks.  Choose one of the following that works best for you: i. Naproxen (Aleve, etc)  Two 274m tabs twice a day ii. Ibuprofen (Advil, etc) Three 2085mtabs four times a day (every meal & bedtime) iii. Acetaminophen (Tylenol, etc) 500-65021mour times a day (every meal & bedtime) d. A  prescription for pain medication (such as oxycodone, hydrocodone, etc) should be given to you upon discharge.  Take your pain medication as prescribed.  i. If you are having problems/concerns with the prescription medicine (does not control pain, nausea, vomiting, rash, itching, etc), please call us [phone] see if we need to switch you to a different pain medicine that will work better for you and/or control your side effect better. ii. If you need a refill on your pain medication, please contact your pharmacy.  They will contact our  office to request authorization. Prescriptions will not be filled after 5 pm or on week-ends. 4. Avoid getting constipated.  Between the surgery and the pain medications, it is common to experience some constipation.  Increasing fluid intake and taking a fiber supplement (such as Metamucil, Citrucel, FiberCon, MiraLax, etc) 1-2 times a day regularly will usually help prevent this problem from occurring.  A mild laxative (prune juice, Milk of Magnesia, MiraLax, etc) should be taken according to package directions if there are no bowel movements after 48 hours.   5. Wash / shower every day.  You may shower over the dressings as they are waterproof.  Continue to shower over incision(s) after the dressing is off. 6. Remove your waterproof bandages 5 days after surgery.  You may leave the incision open to air.  You may have skin tapes (Steri Strips) covering the incision(s).  Leave them on until one week, then remove.  You may replace a dressing/Band-Aid to cover the incision for comfort if you wish.      7. ACTIVITIES as tolerated:   a. You may resume regular (light) daily activities beginning the next day-such as daily self-care, walking, climbing stairs-gradually increasing activities as tolerated.  If you can walk 30 minutes without difficulty, it is safe to try more intense activity such as jogging, treadmill, bicycling, low-impact aerobics, swimming, etc. b. Save the most intensive and strenuous activity for last such as sit-ups, heavy lifting, contact sports, etc  Refrain from any heavy lifting or straining until you  are off narcotics for pain control.   c. DO NOT PUSH THROUGH PAIN.  Let pain be your guide: If it hurts to do something, don't do it.  Pain is your body warning you to avoid that activity for another week until the pain goes down. d. You may drive when you are no longer taking prescription pain medication, you can comfortably wear a seatbelt, and you can safely maneuver your car and apply  brakes. e. Dennis Bast may have sexual intercourse when it is comfortable.  8. FOLLOW UP in our office a. Please call [facility] at [phone] to set up an appointment to see your surgeon in the office for a follow-up appointment approximately 2-3 weeks after your surgery. b. Make sure that you call for this appointment the day you arrive home to insure a convenient appointment time. 9. IF YOU HAVE DISABILITY OR FAMILY LEAVE FORMS, BRING THEM TO THE OFFICE FOR PROCESSING.  DO NOT GIVE THEM TO YOUR DOCTOR.   WHEN TO CALL us [phone]: 1. Poor pain control 2. Reactions / problems with new medications (rash/itching, nausea, etc)  3. Fever over 101.5 F (38.5 C) 4. Worsening swelling or bruising 5. Continued bleeding from incision. 6. Increased pain, redness, or drainage from the incision 7. Difficulty breathing / swallowing   The clinic staff is available to answer your questions during regular business hours (8:30am-5pm).  Please don't hesitate to call and ask to speak to one of our nurses for clinical concerns.   If you have a medical emergency, go to the nearest emergency room or call 911.  A surgeon from [facility] is always on call at the [facility], [place], [place], [place], [place]  [address] ? MAIN: [phone] ? TOLL FREE: [phone] ?  FAX [phone] [email or web address]  End-Stage Kidney Disease The kidneys are two organs that lie on either side of the spine between the middle of the back and the front of the abdomen. The kidneys:   Remove wastes and extra water from the blood.   Produce important hormones. These help keep bones strong, regulate blood pressure, and help create red blood cells.   Balance the fluids and chemicals in the blood and tissues. End-stage kidney disease occurs when the kidneys are so damaged that they cannot do their job. When the kidneys cannot do their job, life-threatening  problems occur. The body cannot stay clean and strong without the help of the kidneys. In end-stage kidney disease, the kidneys cannot get better.You need a new kidney or treatments to do some of the work healthy kidneys do in order to stay alive. CAUSES  End-stage kidney disease usually occurs when a long-lasting (chronic) kidney disease gets worse. It may also occur after the kidneys are suddenly damaged (acute kidney injury).  SYMPTOMS   Swelling (edema) of the legs, ankles, or feet.   Tiredness (lethargy).   Nausea or vomiting.   Confusion.   Problems with urination, such as:   Decreased urine production.   Frequent urination, especially at night.   Frequent accidents in children who are potty trained.   Muscle twitches and cramps.   Persistent itchiness.   Loss of appetite.   Headaches.   Abnormally dark or light skin.   Numbness in the hands or feet.   Easy bruising.   Frequent hiccups.   Menstruation stops. DIAGNOSIS  Your caregiver will measure your blood pressure and take some tests. These may include:  Urine tests.   Blood tests.   Imaging tests, such as:   An ultrasound exam.   Computed tomography (CT).  A kidney biopsy. TREATMENT  There are two treatments for end-stage kidney disease:   A procedure that removes toxic wastes from the body (dialysis).   Receiving a new kidney (kidney transplant). Both of these treatments have serious risks and consequences. Your caregiver will help you determine which treatment is best for you based on your health, age, and other factors. In addition to having dialysis or a kidney transplant, you may need to take medicines to control high blood pressure (hypertension) and cholesterol and to decrease phosphorus levels in your blood.  HOME CARE INSTRUCTIONS  Follow your prescribed diet.   Only take over-the-counter or prescription medicines as directed by your caregiver.   Do not take  any new medicines (prescription, over-the-counter, or nutritional supplements) unless approved by your caregiver. Many medicines can worsen your kidney damage or need to have the dose adjusted.   Keep all follow-up appointments. MAKE SURE YOU:  Understand these instructions.  Will watch your condition.  Will get help right away if you are not doing well or get worse Document Released: 07/27/2003 Document Revised: 04/22/2012 Document Reviewed: 01/03/2012 [facility] Patient Information 2014 [place].

## 2013-07-23 LAB — GLUCOSE, CAPILLARY
Glucose-Capillary: 180 mg/dL — ABNORMAL HIGH (ref 70–99)
Glucose-Capillary: 182 mg/dL — ABNORMAL HIGH (ref 70–99)
Glucose-Capillary: 125 mg/dL — ABNORMAL HIGH (ref 70–99)
Glucose-Capillary: 156 mg/dL — ABNORMAL HIGH (ref 70–99)
Glucose-Capillary: 228 mg/dL — ABNORMAL HIGH (ref 70–99)

## 2013-07-26 LAB — GLUCOSE, CAPILLARY
GLUCOSE-CAPILLARY: 147 mg/dL — AB (ref 70–99)
Glucose-Capillary: 155 mg/dL — ABNORMAL HIGH (ref 70–99)

## 2013-07-28 ENCOUNTER — Other Ambulatory Visit (HOSPITAL_COMMUNITY): Payer: Self-pay | Admitting: Orthopedic Surgery

## 2013-07-29 ENCOUNTER — Encounter (HOSPITAL_COMMUNITY): Payer: Self-pay | Admitting: *Deleted

## 2013-07-29 MED ORDER — CEFAZOLIN SODIUM-DEXTROSE 2-3 GM-% IV SOLR
2.0000 g | INTRAVENOUS | Status: AC
  Administered 2013-07-30: 2 g via INTRAVENOUS
  Filled 2013-07-29: qty 50

## 2013-07-29 NOTE — Progress Notes (Signed)
Anesthesia chart review: Patient is a 58 year old male scheduled for bilateral BKA by Dr. Sharol Given to tomorrow. He is scheduled to be a same-day workup.  History includes end-stage renal disease with hemodialysis TTS (on transplant list at [facility]), diabetes mellitus type 1 with diabetic neuropathy and glomerosclerosis, atrial fibrillation 03/2013 (not on Coumadin due to concerns for Coumadin necrosis according to 07/13/13 notes), hypertension, duodenal cancer s/p Whipple in 2002, murmur, anemia of chronic of disease, hospitalization 07/09/13 for syncope felt secondary to orthostasis from acute blood loss anemia following PD catheter removal and for BLE cellulitis. Cardiologist is Dr. Peter Martinique, last visit 03/2013 during his hospitalization for afib.  EKG on 07/10/13 showed NSR, LAD, septal infarct (age undetermined).  Echo on 07/13/13 showed: - Left ventricle: The cavity size was normal. Systolic function was normal. The estimated ejection fraction was in the range of 55% to 60%. Wall motion was normal; there were no regional wall motion abnormalities. - Left atrium: The atrium was mildly dilated.  He had a nuclear stress test on 11/28/11 (under Media tab) at the [facility] (Dr. Fransico Him) that showed normal myocardial perfusion demonstrating an attenuation artifact in the inferior region of the myocardium, no ischemia or infarct/scar seen in the remaining myocardium, post-stress EF 57%. On 11/09/12 [facility], found under Care Everywhere tab) he had a normal stress echo with normal resting study with no wall motion abnormalities at rest and peak stress, EF > 55%, achieved 10.10 METS.  Carotid duplex on 07/11/13 showed: Right: mild soft plaque noted CCA and origin ICA. Left: moderate soft plaque origin and proximal ICA. Bilateral: 1-39% ICA stenosis. Vertebral artery flow is antegrade.ICA/CCA ratio: R-1.25 L-0.84  CXR on 07/09/13 showed no active cardiopulmonary disease.  He is for labs on arrival.   If labs are acceptable and otherwise no acute changes then I anticipate that he can proceed as planned.  George Hugh [facility] Stay [facility]/Anesthesiology Phone [phone] 07/29/2013 11:08 AM

## 2013-07-29 NOTE — Progress Notes (Signed)
Pt denies SOB and chest pain but is under the care of Dr. Martinique ( cardiology). Pt stated that he only had one bout of A-fib and was taken off of Coumadin thereafter. Pt is on transplant list at [facility] and had a stress/ echo on 11/09/12 ( in epic). Pt also had an echo on 07/13/13 ( in epic). Pt stated that he was not instructed to discontinue Aspirin by surgeon. Ebony Hail, [place] ( anesthesia) made aware of pt cardiac history.

## 2013-07-30 ENCOUNTER — Encounter (HOSPITAL_COMMUNITY): Payer: Managed Care, Other (non HMO) | Admitting: Vascular Surgery

## 2013-07-30 ENCOUNTER — Encounter (HOSPITAL_COMMUNITY)
Admission: RE | Disposition: A | Discharge status: Rehabilitation Facility | Payer: Self-pay | Source: Ambulatory Visit | Attending: Orthopedic Surgery

## 2013-07-30 ENCOUNTER — Encounter (HOSPITAL_COMMUNITY): Payer: Self-pay | Admitting: Anesthesiology

## 2013-07-30 ENCOUNTER — Inpatient Hospital Stay (HOSPITAL_COMMUNITY)
Admission: RE | Admit: 2013-07-30 | Discharge: 2013-08-03 | DRG: 239 | Disposition: A | Discharge status: Rehabilitation Facility | Payer: Managed Care, Other (non HMO) | Source: Ambulatory Visit | Attending: Orthopedic Surgery | Admitting: Orthopedic Surgery

## 2013-07-30 ENCOUNTER — Ambulatory Visit: Payer: Managed Care, Other (non HMO) | Admitting: Physical Therapy

## 2013-07-30 ENCOUNTER — Inpatient Hospital Stay (HOSPITAL_COMMUNITY): Payer: Managed Care, Other (non HMO) | Admitting: Vascular Surgery

## 2013-07-30 DIAGNOSIS — Z992 Dependence on renal dialysis: Secondary | ICD-10-CM

## 2013-07-30 DIAGNOSIS — L97809 Non-pressure chronic ulcer of other part of unspecified lower leg with unspecified severity: Secondary | ICD-10-CM | POA: Diagnosis present

## 2013-07-30 DIAGNOSIS — F3289 Other specified depressive episodes: Secondary | ICD-10-CM | POA: Diagnosis present

## 2013-07-30 DIAGNOSIS — E1049 Type 1 diabetes mellitus with other diabetic neurological complication: Secondary | ICD-10-CM | POA: Diagnosis present

## 2013-07-30 DIAGNOSIS — N039 Chronic nephritic syndrome with unspecified morphologic changes: Secondary | ICD-10-CM

## 2013-07-30 DIAGNOSIS — I70269 Atherosclerosis of native arteries of extremities with gangrene, unspecified extremity: Principal | ICD-10-CM | POA: Diagnosis present

## 2013-07-30 DIAGNOSIS — S81801A Unspecified open wound, right lower leg, initial encounter: Secondary | ICD-10-CM

## 2013-07-30 DIAGNOSIS — K219 Gastro-esophageal reflux disease without esophagitis: Secondary | ICD-10-CM | POA: Diagnosis present

## 2013-07-30 DIAGNOSIS — Z794 Long term (current) use of insulin: Secondary | ICD-10-CM

## 2013-07-30 DIAGNOSIS — Z7982 Long term (current) use of aspirin: Secondary | ICD-10-CM

## 2013-07-30 DIAGNOSIS — E785 Hyperlipidemia, unspecified: Secondary | ICD-10-CM | POA: Diagnosis present

## 2013-07-30 DIAGNOSIS — I4891 Unspecified atrial fibrillation: Secondary | ICD-10-CM | POA: Diagnosis present

## 2013-07-30 DIAGNOSIS — Z89512 Acquired absence of left leg below knee: Secondary | ICD-10-CM

## 2013-07-30 DIAGNOSIS — I120 Hypertensive chronic kidney disease with stage 5 chronic kidney disease or end stage renal disease: Secondary | ICD-10-CM | POA: Diagnosis present

## 2013-07-30 DIAGNOSIS — S81802A Unspecified open wound, left lower leg, initial encounter: Secondary | ICD-10-CM

## 2013-07-30 DIAGNOSIS — F329 Major depressive disorder, single episode, unspecified: Secondary | ICD-10-CM | POA: Diagnosis present

## 2013-07-30 DIAGNOSIS — D62 Acute posthemorrhagic anemia: Secondary | ICD-10-CM | POA: Diagnosis not present

## 2013-07-30 DIAGNOSIS — E1142 Type 2 diabetes mellitus with diabetic polyneuropathy: Secondary | ICD-10-CM | POA: Diagnosis present

## 2013-07-30 DIAGNOSIS — D631 Anemia in chronic kidney disease: Secondary | ICD-10-CM | POA: Diagnosis present

## 2013-07-30 DIAGNOSIS — N186 End stage renal disease: Secondary | ICD-10-CM | POA: Diagnosis present

## 2013-07-30 DIAGNOSIS — Z89511 Acquired absence of right leg below knee: Secondary | ICD-10-CM

## 2013-07-30 HISTORY — DX: Dependence on renal dialysis: N18.6

## 2013-07-30 HISTORY — DX: Unspecified open wound, left lower leg, initial encounter: S81.802A

## 2013-07-30 HISTORY — DX: Dependence on renal dialysis: Z99.2

## 2013-07-30 HISTORY — PX: AMPUTATION: SHX166

## 2013-07-30 HISTORY — DX: Unspecified open wound, right lower leg, initial encounter: S81.801A

## 2013-07-30 LAB — RENAL FUNCTION PANEL
ALBUMIN: 1.3 g/dL — AB (ref 3.5–5.2)
BUN: 25 mg/dL — ABNORMAL HIGH (ref 6–23)
CHLORIDE: 101 meq/L (ref 96–112)
CO2: 25 mEq/L (ref 19–32)
Calcium: 7.8 mg/dL — ABNORMAL LOW (ref 8.4–10.5)
Creatinine, Ser: 4.06 mg/dL — ABNORMAL HIGH (ref 0.50–1.35)
GFR calc Af Amer: 17 mL/min — ABNORMAL LOW (ref >90)
GFR calc non Af Amer: 15 mL/min — ABNORMAL LOW (ref >90)
Glucose, Bld: 87 mg/dL (ref 70–99)
POTASSIUM: 4.3 meq/L (ref 3.7–5.3)
Phosphorus: 3.3 mg/dL (ref 2.3–4.6)
Sodium: 141 mEq/L (ref 137–147)

## 2013-07-30 LAB — CBC
HCT: 23.8 % — ABNORMAL LOW (ref 39.0–52.0)
Hemoglobin: 7.2 g/dL — ABNORMAL LOW (ref 13.0–17.0)
MCH: 28.2 pg (ref 26.0–34.0)
MCHC: 30.3 g/dL (ref 30.0–36.0)
MCV: 93.3 fL (ref 78.0–100.0)
PLATELETS: 298 10*3/uL (ref 150–400)
RBC: 2.55 MIL/uL — ABNORMAL LOW (ref 4.22–5.81)
RDW: 16.8 % — AB (ref 11.5–15.5)
WBC: 14.4 10*3/uL — AB (ref 4.0–10.5)

## 2013-07-30 LAB — GLUCOSE, CAPILLARY
GLUCOSE-CAPILLARY: 134 mg/dL — AB (ref 70–99)
GLUCOSE-CAPILLARY: 151 mg/dL — AB (ref 70–99)
GLUCOSE-CAPILLARY: 119 mg/dL — AB (ref 70–99)
Glucose-Capillary: 120 mg/dL — ABNORMAL HIGH (ref 70–99)
Glucose-Capillary: 103 mg/dL — ABNORMAL HIGH (ref 70–99)

## 2013-07-30 SURGERY — AMPUTATION BELOW KNEE
Anesthesia: General | Site: Leg Lower | Laterality: Bilateral

## 2013-07-30 MED ORDER — SODIUM THIOSULFATE 25 % IV SOLN
25.0000 g | INTRAVENOUS | Status: DC
  Administered 2013-07-31: 25 g via INTRAVENOUS
  Filled 2013-07-30 (×7): qty 100

## 2013-07-30 MED ORDER — SODIUM CHLORIDE 0.9 % IV SOLN
INTRAVENOUS | Status: DC
  Administered 2013-07-30: 20 mL/h via INTRAVENOUS

## 2013-07-30 MED ORDER — METOCLOPRAMIDE HCL 5 MG/ML IJ SOLN
5.0000 mg | Freq: Three times a day (TID) | INTRAMUSCULAR | Status: DC | PRN
Start: 2013-07-30 — End: 2013-08-03

## 2013-07-30 MED ORDER — SEVELAMER CARBONATE 800 MG PO TABS
1600.0000 mg | ORAL_TABLET | Freq: Three times a day (TID) | ORAL | Status: DC
  Administered 2013-07-30 – 2013-08-03 (×13): 1600 mg via ORAL
  Filled 2013-07-30 (×15): qty 2

## 2013-07-30 MED ORDER — PROPOFOL 10 MG/ML IV BOLUS
INTRAVENOUS | Status: AC
  Filled 2013-07-30: qty 20

## 2013-07-30 MED ORDER — PANTOPRAZOLE SODIUM 40 MG PO TBEC
40.0000 mg | DELAYED_RELEASE_TABLET | Freq: Every day | ORAL | Status: DC
  Administered 2013-07-30 – 2013-08-03 (×4): 40 mg via ORAL
  Filled 2013-07-30 (×4): qty 1

## 2013-07-30 MED ORDER — OXYCODONE-ACETAMINOPHEN 5-325 MG PO TABS
1.0000 | ORAL_TABLET | ORAL | Status: DC | PRN
  Administered 2013-07-30 (×3): 2 via ORAL
  Administered 2013-07-31: 1 via ORAL
  Administered 2013-07-31 (×3): 2 via ORAL
  Administered 2013-08-01 – 2013-08-03 (×5): 1 via ORAL
  Filled 2013-07-30: qty 1
  Filled 2013-07-30: qty 2
  Filled 2013-07-30: qty 1
  Filled 2013-07-30: qty 2
  Filled 2013-07-30 (×2): qty 1
  Filled 2013-07-30 (×3): qty 2
  Filled 2013-07-30 (×2): qty 1
  Filled 2013-07-30: qty 2

## 2013-07-30 MED ORDER — INSULIN ASPART 100 UNIT/ML ~~LOC~~ SOLN
0.0000 [IU] | Freq: Three times a day (TID) | SUBCUTANEOUS | Status: DC
  Administered 2013-07-30: 3 [IU] via SUBCUTANEOUS
  Administered 2013-07-31 – 2013-08-01 (×2): 2 [IU] via SUBCUTANEOUS
  Administered 2013-08-01 – 2013-08-02 (×3): 3 [IU] via SUBCUTANEOUS
  Administered 2013-08-03: 2 [IU] via SUBCUTANEOUS

## 2013-07-30 MED ORDER — LIDOCAINE HCL (CARDIAC) 20 MG/ML IV SOLN
INTRAVENOUS | Status: DC | PRN
  Administered 2013-07-30: 100 mg via INTRAVENOUS

## 2013-07-30 MED ORDER — ASPIRIN EC 325 MG PO TBEC
325.0000 mg | DELAYED_RELEASE_TABLET | Freq: Every day | ORAL | Status: DC
  Administered 2013-07-30 – 2013-08-03 (×4): 325 mg via ORAL
  Filled 2013-07-30 (×5): qty 1

## 2013-07-30 MED ORDER — ATORVASTATIN CALCIUM 10 MG PO TABS
10.0000 mg | ORAL_TABLET | Freq: Every day | ORAL | Status: DC
  Administered 2013-07-30 – 2013-08-02 (×4): 10 mg via ORAL
  Filled 2013-07-30 (×5): qty 1

## 2013-07-30 MED ORDER — FENTANYL CITRATE 0.05 MG/ML IJ SOLN
INTRAMUSCULAR | Status: AC
  Filled 2013-07-30: qty 5

## 2013-07-30 MED ORDER — INSULIN GLARGINE 100 UNIT/ML ~~LOC~~ SOLN
10.0000 [IU] | Freq: Every day | SUBCUTANEOUS | Status: DC
  Administered 2013-07-30 – 2013-08-02 (×3): 10 [IU] via SUBCUTANEOUS
  Filled 2013-07-30 (×7): qty 0.1

## 2013-07-30 MED ORDER — RENA-VITE PO TABS
1.0000 | ORAL_TABLET | Freq: Every day | ORAL | Status: DC
  Administered 2013-07-30 – 2013-07-31 (×2): 1 via ORAL
  Administered 2013-08-01: 22:00:00 via ORAL
  Administered 2013-08-02: 1 via ORAL
  Filled 2013-07-30 (×5): qty 1

## 2013-07-30 MED ORDER — INSULIN ASPART 100 UNIT/ML ~~LOC~~ SOLN
3.0000 [IU] | Freq: Three times a day (TID) | SUBCUTANEOUS | Status: DC
  Administered 2013-07-30 – 2013-08-03 (×8): 3 [IU] via SUBCUTANEOUS

## 2013-07-30 MED ORDER — PROPOFOL 10 MG/ML IV BOLUS
INTRAVENOUS | Status: DC | PRN
  Administered 2013-07-30: 150 mg via INTRAVENOUS

## 2013-07-30 MED ORDER — PROMETHAZINE HCL 25 MG/ML IJ SOLN: 6.2500 mg | INTRAMUSCULAR | Status: DC | PRN

## 2013-07-30 MED ORDER — ONDANSETRON HCL 4 MG PO TABS
4.0000 mg | ORAL_TABLET | Freq: Four times a day (QID) | ORAL | Status: DC | PRN
Start: 2013-07-30 — End: 2013-08-03

## 2013-07-30 MED ORDER — ONDANSETRON HCL 4 MG/2ML IJ SOLN
INTRAMUSCULAR | Status: AC
Start: 2013-07-30 — End: 2013-07-30
  Filled 2013-07-30: qty 2

## 2013-07-30 MED ORDER — DEXTROSE 5 % IV SOLN
INTRAVENOUS | Status: DC | PRN
  Administered 2013-07-30: 08:00:00 via INTRAVENOUS

## 2013-07-30 MED ORDER — FENTANYL CITRATE 0.05 MG/ML IJ SOLN
INTRAMUSCULAR | Status: DC | PRN
  Administered 2013-07-30 (×2): 50 ug via INTRAVENOUS

## 2013-07-30 MED ORDER — CEFAZOLIN SODIUM 1-5 GM-% IV SOLN
1.0000 g | Freq: Four times a day (QID) | INTRAVENOUS | Status: AC
  Administered 2013-07-30 – 2013-07-31 (×3): 1 g via INTRAVENOUS
  Filled 2013-07-30 (×3): qty 50

## 2013-07-30 MED ORDER — METHOCARBAMOL 500 MG PO TABS
500.0000 mg | ORAL_TABLET | Freq: Four times a day (QID) | ORAL | Status: DC | PRN
  Administered 2013-07-31 – 2013-08-01 (×3): 500 mg via ORAL
  Filled 2013-07-30 (×3): qty 1

## 2013-07-30 MED ORDER — METOCLOPRAMIDE HCL 10 MG PO TABS: 5.0000 mg | ORAL_TABLET | Freq: Three times a day (TID) | ORAL | Status: DC | PRN

## 2013-07-30 MED ORDER — MIDAZOLAM HCL 5 MG/5ML IJ SOLN
INTRAMUSCULAR | Status: DC | PRN
  Administered 2013-07-30 (×2): 1 mg via INTRAVENOUS

## 2013-07-30 MED ORDER — ONDANSETRON HCL 4 MG/2ML IJ SOLN
INTRAMUSCULAR | Status: DC | PRN
  Administered 2013-07-30: 4 mg via INTRAVENOUS

## 2013-07-30 MED ORDER — LIDOCAINE HCL (CARDIAC) 20 MG/ML IV SOLN
INTRAVENOUS | Status: AC
  Filled 2013-07-30: qty 5

## 2013-07-30 MED ORDER — MIDAZOLAM HCL 2 MG/2ML IJ SOLN
INTRAMUSCULAR | Status: AC
Start: 2013-07-30 — End: 2013-07-30
  Filled 2013-07-30: qty 2

## 2013-07-30 MED ORDER — ONDANSETRON HCL 4 MG/2ML IJ SOLN
4.0000 mg | Freq: Four times a day (QID) | INTRAMUSCULAR | Status: DC | PRN
  Administered 2013-08-02: 4 mg via INTRAVENOUS
  Filled 2013-07-30: qty 2

## 2013-07-30 MED ORDER — GABAPENTIN 300 MG PO CAPS
300.0000 mg | ORAL_CAPSULE | Freq: Three times a day (TID) | ORAL | Status: DC
  Administered 2013-07-30 – 2013-08-03 (×12): 300 mg via ORAL
  Filled 2013-07-30 (×15): qty 1

## 2013-07-30 MED ORDER — SERTRALINE HCL 50 MG PO TABS
50.0000 mg | ORAL_TABLET | Freq: Every day | ORAL | Status: DC
  Administered 2013-08-01 – 2013-08-03 (×3): 50 mg via ORAL
  Filled 2013-07-30 (×5): qty 1

## 2013-07-30 MED ORDER — HYDROMORPHONE HCL PF 1 MG/ML IJ SOLN: 0.2500 mg | INTRAMUSCULAR | Status: DC | PRN

## 2013-07-30 MED ORDER — GABAPENTIN 300 MG PO CAPS
300.0000 mg | ORAL_CAPSULE | Freq: Every day | ORAL | Status: DC
  Filled 2013-07-30: qty 1

## 2013-07-30 MED ORDER — OXYCODONE HCL 5 MG PO TABS: 5.0000 mg | ORAL_TABLET | Freq: Once | ORAL | Status: DC | PRN

## 2013-07-30 MED ORDER — SODIUM CHLORIDE 0.9 % IV SOLN
INTRAVENOUS | Status: DC | PRN
  Administered 2013-07-30: 08:00:00 via INTRAVENOUS

## 2013-07-30 MED ORDER — METHOCARBAMOL 100 MG/ML IJ SOLN: 500.0000 mg | Freq: Four times a day (QID) | INTRAVENOUS | Status: DC | PRN

## 2013-07-30 MED ORDER — HYDROMORPHONE HCL PF 1 MG/ML IJ SOLN
0.5000 mg | INTRAMUSCULAR | Status: DC | PRN
  Administered 2013-07-30: 1 mg via INTRAVENOUS
  Filled 2013-07-30: qty 1

## 2013-07-30 MED ORDER — OXYCODONE HCL 5 MG/5ML PO SOLN: 5.0000 mg | Freq: Once | ORAL | Status: DC | PRN

## 2013-07-30 SURGICAL SUPPLY — 44 items
BANDAGE ESMARK 6X9 LF (GAUZE/BANDAGES/DRESSINGS) ×1 IMPLANT
BANDAGE GAUZE ELAST BULKY 4 IN (GAUZE/BANDAGES/DRESSINGS) ×4 IMPLANT
BLADE SAW RECIP 87.9 MT (BLADE) ×5 IMPLANT
BLADE SURG 21 STRL SS (BLADE) ×3 IMPLANT
BNDG CMPR 9X6 STRL LF SNTH (GAUZE/BANDAGES/DRESSINGS) ×1
BNDG COHESIVE 6X5 TAN STRL LF (GAUZE/BANDAGES/DRESSINGS) ×4 IMPLANT
BNDG ESMARK 6X9 LF (GAUZE/BANDAGES/DRESSINGS) ×3
COVER SURGICAL LIGHT HANDLE (MISCELLANEOUS) ×3 IMPLANT
CUFF TOURNIQUET SINGLE 34IN LL (TOURNIQUET CUFF) ×4 IMPLANT
CUFF TOURNIQUET SINGLE 44IN (TOURNIQUET CUFF) IMPLANT
DRAIN PENROSE 1/2X12 LTX STRL (WOUND CARE) IMPLANT
DRAPE EXTREMITY T 121X128X90 (DRAPE) ×3 IMPLANT
DRAPE PROXIMA HALF (DRAPES) ×6 IMPLANT
DRAPE U-SHAPE 47X51 STRL (DRAPES) ×6 IMPLANT
DRSG ADAPTIC 3X8 NADH LF (GAUZE/BANDAGES/DRESSINGS) ×3 IMPLANT
DRSG PAD ABDOMINAL 8X10 ST (GAUZE/BANDAGES/DRESSINGS) ×3 IMPLANT
DURAPREP 26ML APPLICATOR (WOUND CARE) ×5 IMPLANT
ELECT REM PT RETURN 9FT ADLT (ELECTROSURGICAL) ×3
ELECTRODE REM PT RTRN 9FT ADLT (ELECTROSURGICAL) ×1 IMPLANT
GLOVE BIOGEL PI IND STRL 9 (GLOVE) ×1 IMPLANT
GLOVE BIOGEL PI INDICATOR 9 (GLOVE) ×2
GLOVE SURG ORTHO 9.0 STRL STRW (GLOVE) ×5 IMPLANT
GOWN STRL REUS W/ TWL XL LVL3 (GOWN DISPOSABLE) ×2 IMPLANT
GOWN STRL REUS W/TWL XL LVL3 (GOWN DISPOSABLE) ×6
KIT BASIN OR (CUSTOM PROCEDURE TRAY) ×3 IMPLANT
KIT ROOM TURNOVER OR (KITS) ×3 IMPLANT
MANIFOLD NEPTUNE II (INSTRUMENTS) ×3 IMPLANT
NS IRRIG 1000ML POUR BTL (IV SOLUTION) ×3 IMPLANT
PACK GENERAL/GYN (CUSTOM PROCEDURE TRAY) ×3 IMPLANT
PAD ABD 8X10 STRL (GAUZE/BANDAGES/DRESSINGS) ×2 IMPLANT
PAD ARMBOARD 7.5X6 YLW CONV (MISCELLANEOUS) ×6 IMPLANT
SPONGE GAUZE 4X4 12PLY (GAUZE/BANDAGES/DRESSINGS) ×3 IMPLANT
SPONGE GAUZE 4X4 12PLY STER LF (GAUZE/BANDAGES/DRESSINGS) ×2 IMPLANT
SPONGE LAP 18X18 X RAY DECT (DISPOSABLE) IMPLANT
STAPLER VISISTAT 35W (STAPLE) IMPLANT
STOCKINETTE IMPERVIOUS LG (DRAPES) ×3 IMPLANT
SUT PDS AB 1 CT  36 (SUTURE)
SUT PDS AB 1 CT 36 (SUTURE) IMPLANT
SUT SILK 2 0 (SUTURE) ×3
SUT SILK 2-0 18XBRD TIE 12 (SUTURE) ×1 IMPLANT
TOWEL OR 17X24 6PK STRL BLUE (TOWEL DISPOSABLE) ×3 IMPLANT
TOWEL OR 17X26 10 PK STRL BLUE (TOWEL DISPOSABLE) ×3 IMPLANT
TUBE ANAEROBIC SPECIMEN COL (MISCELLANEOUS) IMPLANT
WATER STERILE IRR 1000ML POUR (IV SOLUTION) ×3 IMPLANT

## 2013-07-30 NOTE — Care Management Note (Signed)
CARE MANAGEMENT NOTE 07/30/2013  Patient:  Paul [name], Paul [name]   Account Number:  [id number]  Date Initiated:  07/30/2013  Documentation initiated by:  Ricki Miller  Subjective/Objective Assessment:   58 yr old male s/p bilateral BKA'S. Has ESRD on HD T/Thurs/Sat.     Action/Plan:   Awaiting PT/OT eval. Possible CIR   Anticipated DC Date:     Anticipated DC Plan:  IP REHAB FACILITY         Choice offered to / List presented to:             Status of service:  In process, will continue to follow Medicare Important Message given?   (If response is "NO", the following Medicare IM given date fields will be blank) Date Medicare IM given:   Date Additional Medicare IM given:    Discharge Disposition:    Per UR Regulation:    If discussed at Long Length of Stay Meetings, dates discussed:    Comments:

## 2013-07-30 NOTE — Consult Note (Addendum)
Renal Service Consult Note [facility]  XABI WITTLER 07/30/2013 Roney Jaffe D Requesting Physician:  Dr Sharol Given  Reason for Consult:  ESRD pt with bilat BKA for gangrene HPI: The patient is a 58 y.o. year-old with ESRD, DM, HTN and afib who is post-op bilat LE BKA done earlier this am.  Renal service asked to see for RRT needs.    Pt has had progressive wounds of both LE's over the last several months, including development of gangrene of the toes and patchy necrosis of the calves.  He has been treated at [facility] by Dr Jerline Pain with hyperbaric O2 5d per week.  He is getting Na Thiosulfate with HD for suspected calciphylaxis. Pt says doctor's aren't sure if these ulcers were diabetic ulcers or calciphylaxis.  He underwent bilat BKA today.  He says that the wounds had gotten so bad that he was told they would take a very long time to heal, over a year, and that he decided on amputation in part so that he could get back on the renal transplant list.    ROS  no abd pain  no cp, sob  no n/v/d  no jt pain   Past Medical History  Past Medical History  Diagnosis Date  . Open wound of both legs with complication     Pt has had progressive wounds of both LE's including gangrene of the toes and patchy necrosis of the calves.  He has been treated at [facility] by Dr Jerline Pain with hyperbaric O2 5d per week.  He is getting Na Thiosulfate with HD for suspected calciphylaxis. Pt says doctor's aren't sure if these ulcers were diabetic ulcers or calciphylaxis.  He underwent bilat BKA on 07/30/13.  He says that the woun  . ESRD on hemodialysis     Pt has ESRD due to DM.  He had a L upper arm AVF prior to starting HD which was ligated due to L arm steal syndrome.  He started HD in Jan 2014 and did home HD.  The family couldn't cannulate the R arm AVF successfully so by mid 2014 they decided to switch to PD which was done late summer 2014.  In Jan 2015 he was admitted with FTT felt to be due  to underdialysis on PD and PD was abandoned and he   . Anemia     esrd  . Hypertension     off of meds due to orthostatic hypotension  . Closed left arm fracture 1967  . Heart murmur   . Corneal abrasion, left   . GERD (gastroesophageal reflux disease)   . History of blood transfusion   . Glomerulosclerosis, diabetic   . Depression   . A-fib   . Calciphylaxis 05/2013    lower ext  . Diabetes mellitus     Type 1 iddm x 11 yrs  . Diabetic neuropathy   . Cancer     hx duodenal adenoCa 2012, resected   Past Surgical History  Past Surgical History  Procedure Laterality Date  . Whipple procedure  2002    duodenal ca, Dr. Harlow Asa  . Bone marrow biopsy  2012  . Renal biopsy, percutaneous  2012  . Tonsillectomy    . Portacath placement  2002  . Biliary diversion, external  2002  . Other surgical history      Cyst removed from back  . Av fistula placement  06/07/2011    Procedure: ARTERIOVENOUS (AV) FISTULA CREATION;  Surgeon: Angelia Mould, MD;  Location: [facility];  Service: Vascular;  Laterality: Left;  . Ligation of arteriovenous  fistula  05/22/2012    Procedure: LIGATION OF ARTERIOVENOUS  FISTULA;  Surgeon: Mal Misty, MD;  Location: [place];  Service: Vascular;  Laterality: Left;  Left brachio-cephalic fistula  . Insertion of dialysis catheter  05/22/2012    Procedure: INSERTION OF DIALYSIS CATHETER;  Surgeon: Mal Misty, MD;  Location: [place];  Service: Vascular;  Laterality: N/A;  right internal jugular vein  . Av fistula placement  06/18/2012    Procedure: ARTERIOVENOUS (AV) FISTULA CREATION;  Surgeon: Mal Misty, MD;  Location: [place];  Service: Vascular;  Laterality: Right;  [place]  . Colonoscopy      Hx: of  . Capd insertion N/A 01/14/2013    Procedure: LAPAROSCOPIC INSERTION CONTINUOUS AMBULATORY PERITONEAL DIALYSIS  (CAPD) CATHETER;  Surgeon: Adin Hector, MD;  Location: [place];  Service: General;  Laterality: N/A;  . Vascular surgery    . Removal of a dialysis  catheter    . Capd removal N/A 07/09/2013    Procedure: CONTINUOUS AMBULATORY PERITONEAL DIALYSIS  (CAPD) CATHETER REMOVAL;  Surgeon: Adin Hector, MD;  Location: [place];  Service: General;  Laterality: N/A;   Family History  Family History  Problem Relation Age of Onset  . Cancer Mother   . Cancer Father     prostate cancer  . Heart disease Maternal Grandfather   . Stroke Paternal Grandmother    Social History  reports that he has never smoked. He has never used smokeless tobacco. He reports that he does not drink alcohol or use illicit drugs. Allergies  Allergies  Allergen Reactions  . Metformin And Related Other (See Comments)    Kidney problems   Home medications Prior to Admission medications   Medication Sig Start Date End Date Taking? Authorizing Provider  aspirin EC 81 MG tablet Take 81 mg by mouth daily. 06/11/13  Yes Daniel J Angiulli, PA-C  atorvastatin (LIPITOR) 10 MG tablet Take 10 mg by mouth daily. 06/11/13  Yes Daniel J Angiulli, PA-C  collagenase (SANTYL) ointment Apply 1 application topically daily.   Yes Historical Provider, MD  doxycycline (VIBRA-TABS) 100 MG tablet Take 100 mg by mouth daily. For 6 days.  Started 07/28/2013   Yes Historical Provider, MD  gabapentin (NEURONTIN) 300 MG capsule Take 300 mg by mouth at bedtime. 06/11/13  Yes Daniel J Angiulli, PA-C  Insulin Glargine (LANTUS SOLOSTAR) 100 UNIT/ML Solostar Pen Inject 5-10 Units into the skin 2 (two) times daily. Inject 5 units every morning and 10 units every night   Yes Historical Provider, MD  multivitamin (RENA-VIT) TABS tablet Take 1 tablet by mouth at bedtime. 06/11/13  Yes Daniel J Angiulli, PA-C  nitroGLYCERIN (NITRODUR - DOSED IN MG/24 HR) 0.1 mg/hr patch Place 0.2 mg onto the skin daily. One patch on each foot. Leave on for 12 hours, then remove for 12 hours 07/02/13  Yes Historical Provider, MD  omeprazole (PRILOSEC) 40 MG capsule Take 40 mg by mouth daily. 06/11/13  Yes Daniel J Angiulli, PA-C   oxyCODONE-acetaminophen (PERCOCET) 10-325 MG per tablet Take 1 tablet by mouth 2 (two) times daily as needed for pain.   Yes Historical Provider, MD  sertraline (ZOLOFT) 50 MG tablet Take 50 mg by mouth daily.   Yes Historical Provider, MD  sevelamer carbonate (RENVELA) 800 MG tablet Take 1,600 mg by mouth 3 (three) times daily with meals. 06/11/13  Yes Lavon Paganini Angiulli, PA-C  triamcinolone cream (KENALOG) 0.1 % Apply 1 application topically daily as needed (pain).  03/30/13  Yes Historical Provider, MD   Liver Function Tests No results found for this basename: AST, ALT, ALKPHOS, BILITOT, PROT, ALBUMIN,  in the last 168 hours No results found for this basename: LIPASE, AMYLASE,  in the last 168 hours CBC No results found for this basename: WBC, NEUTROABS, HGB, HCT, MCV, PLT,  in the last 168 hours Basic Metabolic Panel No results found for this basename: NA, K, CL, CO2, GLUCOSE, BUN, CREATININE, ALB, CALCIUM, PHOS,  in the last 168 hours  Filed Vitals:   07/30/13 0930 07/30/13 0945 07/30/13 1000 07/30/13 1010  BP: 127/64  129/64 132/63  Pulse: 79 80 81 78  Temp:   98.4 F (36.9 C) 98.5 F (36.9 C)  TempSrc:    Oral  Resp: $Remo'17 15 18 18  'qZVeS$ Height:      Weight:      SpO2: 100% 94% 94% 94%   Exam: Alert, no distress, calm No rash, cyanosis or gangrene Sclera anicteric, throat clear No jvd Clear bilat, no rales RRR soft SEM no RG Abd soft, nt, nd, no ascites Bilat BKA w dressings in place, no LE edema R FA fistula frail but patent Neuro is nf, ox3   Dialysis: TTS NW 4h  81.5kg   2/2.5 Bath  RFA AVF    Heparin none (due to leg wounds - he has clotted system 2x recentl) > resuming tight hep x 2 then advance as needed Epo 18000    Na thiosulfate 25gm tiw   Assessment: 1 Gangrene bilat LE's, s/p bilat BKA 2 ESRD on hemodialysis 3 Calciphylaxis- on Na thio w HD 4 Anemia - lab pending 5 MBD cont sevelamer ac 6 Hx afib, not on any Rx 7 DM on insulin 8 Hx HTN- off of BP meds due  to orthostatic hypotension 9 Volume- no vol excess on exam, doubt weights are accurate   Plan- HD tomorrow, UF 2-3 kg as tolerated, will follow.  Will resume heparin now that bleeding leg wounds are not an issue (start tight hep postop and titrate to regular). Will continue Na thio for now.    Kelly Splinter MD (pgr) [phone]    [phone] 07/30/2013, 11:53 AM

## 2013-07-30 NOTE — Preoperative (Signed)
Beta Blockers   Reason not to administer Beta Blockers:Not Applicable 

## 2013-07-30 NOTE — Progress Notes (Signed)
Utilization review completed.  

## 2013-07-30 NOTE — Evaluation (Signed)
Physical Therapy Evaluation Patient Details Name: MONG FUSON MRN: [id number] DOB: [birth year] Today's Date: 07/30/2013 Time: [id number] PT Time Calculation (min): 30 min  PT Assessment / Plan / Recommendation History of Present Illness  58 y.o. year-old with ESRD, DM, HTN and afib who is post-op bilat LE BKA  Clinical Impression  Patient is s/p bilateral transtibial amputation surgery resulting in functional limitations due to the deficits listed below (see PT Problem List). Patient is highly motivated and will benefit from skilled PT to increase their independence and safety with mobility to allow discharge to the venue listed below.       PT Assessment  Patient needs continued PT services    Follow Up Recommendations  CIR;Supervision/Assistance - 24 hour    Does the patient have the potential to tolerate intense rehabilitation      Barriers to Discharge Decreased caregiver support;Inaccessible home environment Pt unable to fit w/c into bedroom on first floor; Wife is not available 24 hours for care    Equipment Recommendations  Wheelchair (measurements PT);Wheelchair cushion (measurements PT);Other (comment) (Sliding board)    Recommendations for Other Services Rehab consult;OT consult   Frequency Min 3X/week    Precautions / Restrictions Precautions Precautions: Fall Precaution Booklet Issued: No Precaution Comments: previous episodes of syncope Restrictions Weight Bearing Restrictions: Yes RLE Weight Bearing: Non weight bearing LLE Weight Bearing: Non weight bearing Other Position/Activity Restrictions: No pillow under knees   Pertinent Vitals/Pain 6/10 Pain Pt states pain medication was recently given Repositioned in chair for comfort with residual limbs elevated      Mobility  Bed Mobility Overal bed mobility: Needs Assistance;+2 for physical assistance Bed Mobility: Supine to Sit Supine to sit: +2 for physical assistance;Mod assist;HOB elevated General bed  mobility comments: Mod A for for supine>sit using bed bad to scoot hip forward; pt with good balance and trunk strength Transfers Overall transfer level: Needs assistance Equipment used: None Transfers: Government social research officer transfers: +2 physical assistance;Mod assist General transfer comment: Pt able to scoot posteriorly without assist for the first 50% of transfer. Pt needed assist +2 to lift bed pad and scoot patient the remaining 50% back into the chair    Exercises Amputee Exercises Quad Sets: AROM;Both;10 reps;Supine Knee Extension: AROM;Both;10 reps;Seated Straight Leg Raises: AROM;10 reps;Supine;Both   PT Diagnosis: Generalized weakness;Acute pain;Difficulty walking;Other (comment) (Difficulty transfering)  PT Problem List: Decreased strength;Decreased range of motion;Decreased activity tolerance;Decreased balance;Decreased mobility;Decreased knowledge of use of DME;Decreased knowledge of precautions;Pain PT Treatment Interventions: DME instruction;Functional mobility training;Therapeutic activities;Therapeutic exercise;Balance training;Neuromuscular re-education;Patient/family education;Wheelchair mobility training;Modalities     PT Goals(Current goals can be found in the care plan section) Acute Rehab PT Goals Patient Stated Goal: Would like to get prosthetics so he can walk again PT Goal Formulation: With patient/family Time For Goal Achievement: 08/06/13 Potential to Achieve Goals: Good Additional Goals Additional Goal #1: With supervision (With supervision)  Visit Information  Last PT Received On: 07/30/13 Assistance Needed: +2 History of Present Illness: 58 y.o. year-old with ESRD, DM, HTN and afib who is post-op bilat LE BKA       Prior Functioning  Home Living Family/patient expects to be discharged to:: Inpatient rehab Living Arrangements: Spouse/significant other Available Help at Discharge: Family Type of Home: House Home Access:  Stairs to enter [employer] of Steps: 4-5 Entrance Stairs-Rails: Right;Left Home Layout: Multi-level;Other (Comment) (W/c not able to fit in first level bed/bath) Alternate Level Stairs-Number of Steps: 1 step to get into living room Alternate Level Stairs-Rails: None  Home Equipment: Bedside commode;Walker - 2 wheels;Cane - single point;Crutches;Shower seat Additional Comments: Downstairs bathroom with double doors, very wide space with exception of area around toilet. May not be able to enter bedroom on first floor with W/c  Prior Function Level of Independence: Needs assistance Gait / Transfers Assistance Needed: uses a RW ADL's / Homemaking Assistance Needed: able to dress/bath self Comments: Needed assist with climbing stairs Communication Communication: No difficulties Dominant Hand: Right    Cognition  Cognition Arousal/Alertness: Awake/alert Behavior During Therapy: WFL for tasks assessed/performed Overall Cognitive Status: Within Functional Limits for tasks assessed    Extremity/Trunk Assessment Upper Extremity Assessment Upper Extremity Assessment: Overall WFL for tasks assessed Lower Extremity Assessment Lower Extremity Assessment: RLE deficits/detail;LLE deficits/detail RLE Deficits / Details: Transtibial amputation; pt at least 3+/5 knee extension RLE: Unable to fully assess due to pain LLE Deficits / Details: Transtibial amputation; Pt at least 3+/5 knee flexion LLE: Unable to fully assess due to pain   Balance Balance Overall balance assessment: Needs assistance Sitting-balance support: Bilateral upper extremity supported Sitting balance-Leahy Scale: Poor Sitting balance - Comments: pt able to balance safely with use of bilateral UEs  End of Session PT - End of Session Activity Tolerance: Patient tolerated treatment well Patient left: in chair;with call bell/phone within reach;with family/visitor present Nurse Communication: Mobility status  GP     [place], [place]  Ellouise Newer 07/30/2013, 4:07 PM

## 2013-07-30 NOTE — H&P (Signed)
Paul [name] is an 58 y.o. male.   Chief Complaint: Extensive gangrene bilateral lower extremities HPI: Patient is a 58 year old gentleman with diabetes severe peripheral vascular disease who has been treated at the wound center. Patient has had progressive gangrenous changes involving bilateral lower extremities with ischemic ulcers extending up to midcalf.  Past Medical History  Diagnosis Date  . Diabetic neuropathy   . Cancer     History of Duodenal Cancer  . Chronic kidney disease     Pt is not eligible for transplant per [facility]  . Anemia     esrd  . Hypertension     meds d/c 'd  2 wks ago for postural hypotension  . Fatigue      tires easily x 2 months  . Chronic nausea     with  morning vomiting; better off  HTN meds  . Intermittent tremor   . Closed left arm fracture 1967  . Heart murmur   . Corneal abrasion, left   . Shortness of breath     exertional  . GERD (gastroesophageal reflux disease)   . History of blood transfusion   . Lack of coordination   . Muscle weakness (generalized)   . Glomerulosclerosis, diabetic   . Depression   . A-fib   . Critical lower limb ischemia   . SIRS (systemic inflammatory response syndrome) 04/02/2013  . Calciphylaxis 05/2013    lower ext  . Dysrhythmia     Afib  . Diabetes mellitus     Type 1 iddm x 11 yrs    Past Surgical History  Procedure Laterality Date  . Whipple procedure  2002    duodenal ca, Dr. Harlow Asa  . Bone marrow biopsy  2012  . Renal biopsy, percutaneous  2012  . Tonsillectomy    . Portacath placement  2002  . Biliary diversion, external  2002  . Other surgical history      Cyst removed from back  . Av fistula placement  06/07/2011    Procedure: ARTERIOVENOUS (AV) FISTULA CREATION;  Surgeon: Angelia Mould, MD;  Location: [place];  Service: Vascular;  Laterality: Left;  . Ligation of arteriovenous  fistula  05/22/2012    Procedure: LIGATION OF ARTERIOVENOUS  FISTULA;  Surgeon: Mal Misty, MD;  Location:  [place];  Service: Vascular;  Laterality: Left;  Left brachio-cephalic fistula  . Insertion of dialysis catheter  05/22/2012    Procedure: INSERTION OF DIALYSIS CATHETER;  Surgeon: Mal Misty, MD;  Location: [place];  Service: Vascular;  Laterality: N/A;  right internal jugular vein  . Av fistula placement  06/18/2012    Procedure: ARTERIOVENOUS (AV) FISTULA CREATION;  Surgeon: Mal Misty, MD;  Location: [place];  Service: Vascular;  Laterality: Right;  [place]  . Colonoscopy      Hx: of  . Capd insertion N/A 01/14/2013    Procedure: LAPAROSCOPIC INSERTION CONTINUOUS AMBULATORY PERITONEAL DIALYSIS  (CAPD) CATHETER;  Surgeon: Adin Hector, MD;  Location: [place];  Service: General;  Laterality: N/A;  . Vascular surgery    . Removal of a dialysis catheter    . Capd removal N/A 07/09/2013    Procedure: CONTINUOUS AMBULATORY PERITONEAL DIALYSIS  (CAPD) CATHETER REMOVAL;  Surgeon: Adin Hector, MD;  Location: [place];  Service: General;  Laterality: N/A;    Family History  Problem Relation Age of Onset  . Cancer Mother   . Cancer Father     prostate cancer  . Heart disease  Maternal Grandfather   . Stroke Paternal Grandmother    Social History:  reports that he has never smoked. He has never used smokeless tobacco. He reports that he does not drink alcohol or use illicit drugs.  Allergies:  Allergies  Allergen Reactions  . Metformin And Related Other (See Comments)    Kidney problems    No prescriptions prior to admission    No results found for this or any previous visit (from the past 48 hour(s)). No results found.  Review of Systems  All other systems reviewed and are negative.    There were no vitals taken for this visit. Physical Exam  On examination patient has dry and wet gangrene involving both lower extremities. His forefoot is essentially mummified. There are large black gangrenous ulcers which extends up to the calf posteriorly on both legs and these ulcers are about  3-4 cm in diameter. Assessment/Plan Assessment: Severe gangrenous changes bilateral lower extremities. Plan patient will require bilateral transtibial amputation. Do to the location of the plaque gangrenous ulcers on the calf patient may have difficulty with the flap healing. May require revision. Risks and benefits of surgery were discussed including his greatest risk of the flap not healing. Patient states he understands was to proceed at this time.  DUDA,MARCUS V 07/30/2013, 6:37 AM

## 2013-07-30 NOTE — Progress Notes (Signed)
Physical medicine and rehabilitation consult requested. Patient's status post bilateral below-knee amputation this a.m. 07/30/2013. We'll await formal physical and occupational therapy evaluations and followup with formal rehabilitation consult as well as recommendations

## 2013-07-30 NOTE — Op Note (Signed)
OPERATIVE REPORT  DATE OF SURGERY: 07/30/2013  PATIENT:  Paul [name],  58 y.o. male  PRE-OPERATIVE DIAGNOSIS:  Bilateral Foot/Ankle Gangrene  POST-OPERATIVE DIAGNOSIS:  Bilateral Foot/Ankle Gangrene  PROCEDURE:  Procedure(s): AMPUTATION BELOW KNEE  SURGEON:  Surgeon(s): Newt Minion, MD  ANESTHESIA:   general  EBL:  min ML  SPECIMEN:  Source of Specimen:  Both legs  TOURNIQUET:   Total Tourniquet Time Documented: Thigh (Left) - 8 minutes Total: Thigh (Left) - 8 minutes  Thigh (Right) - 24 minutes Total: Thigh (Right) - 24 minutes   PROCEDURE DETAILS: Patient is a 58 year old gentleman with severe peripheral vascular disease. He has been treated at the wound center. Patient presented with wet and dry gangrene of both lower extremities with ulcerations up to mid calf level. Patient has no foot salvage options and presents for bilateral transtibial amputation. Risks and benefits were discussed including risk of nonhealing of the wound. Patient states he understands was to proceed at this time. Description of procedure patient was brought to the operating room and underwent a general anesthetic. After adequate levels and anesthesia were obtained patient's both lower extremities were prepped using DuraPrep draped into a sterile field and the necrotic foot was draped out of sterile field with an impervious stockinette. Attention was first focused on the left lower extremity. A transverse incision was made 11 cm distal to the tibial tubercle this curved proximally and a large posterior flap was created. The flap has be adjusted to circumvent the large necrotic ulcer on the posterior aspect of the calf. The tibia was transected just popped with the skin incision the fibula was transected just proximal to the distal tibial incision. A large posterior flap was created and. The sciatic nerve was pulled cut and allowed to retract. The vascular bundles were suture ligated with 2-0 silk. The  tourniquet was deflated hemostasis was obtained. The deep and superficial fascial layers were closed using #1 PDS. The skin was closed using staples. Wound was covered Adaptic orthopedic sponges AB dressing Kerlix and Coban. Patient was then focused on the right lower extremity. Patient also had multiple ulcers on the posterior aspect of the calf and the tissue flap had to be modified to exclude these necrotic wounds from the incision. A transverse incision was made 11 cm distal the tibial tubercle was curved proximally and a large posterior flap was created. The tibia was transected just proximal to his incision the fibula was transected just proximal to the tibial incision. A knife was used to create a large posterior flap. The sciatic nerve was pulled cut and allowed to retract the vascular bundles were suture ligated with 2-0 silk. Patient had extreme ischemic changes to the medial and lateral gastroc muscles and these were both were resected back further. Tourniquet was deflated hemostasis was obtained the deep and superficial fascial layers were closed using #1 PDS the skin was closed using staples. Wound was covered Adaptic orthopedic sponges AB dressing Kerlix and Coban. Patient was extubated taken to the PACU in stable condition.  PLAN OF CARE: Admit to inpatient   PATIENT DISPOSITION:  PACU - hemodynamically stable.   Newt Minion, MD 07/30/2013 9:38 AM

## 2013-07-30 NOTE — Transfer of Care (Signed)
Immediate Anesthesia Transfer of Care Note  Patient: Paul [name]  Procedure(s) Performed: Procedure(s) with comments: AMPUTATION BELOW KNEE (Bilateral) - Bilateral Below Knee Amputations  Patient Location: PACU  Anesthesia Type:General  Level of Consciousness: awake and patient cooperative  Airway & Oxygen Therapy: Patient Spontanous Breathing and Patient connected to nasal cannula oxygen  Post-op Assessment: Report given to PACU RN and Post -op Vital signs reviewed and stable  Post vital signs: Reviewed and stable  Complications: No apparent anesthesia complications

## 2013-07-30 NOTE — Anesthesia Preprocedure Evaluation (Addendum)
Anesthesia Evaluation  Patient identified by MRN, date of birth, ID band Patient awake    Reviewed: Allergy & Precautions, H&P , NPO status , Patient's Chart, lab work & pertinent test results, reviewed documented beta blocker date and time   History of Anesthesia Complications Negative for: history of anesthetic complications  Airway Mallampati: II TM Distance: >3 FB Neck ROM: Full    Dental  (+) Teeth Intact, Dental Advisory Given   Pulmonary neg pulmonary ROS,    Pulmonary exam normal       Cardiovascular hypertension, Pt. on medications + Peripheral Vascular Disease + dysrhythmias     Neuro/Psych PSYCHIATRIC DISORDERS Depression negative neurological ROS     GI/Hepatic Neg liver ROS, GERD-  Medicated and Controlled,  Endo/Other  diabetes, Well Controlled, Type 1, Insulin Dependent  Renal/GU CRF and ESRFRenal disease     Musculoskeletal   Abdominal   Peds  Hematology  (+) anemia ,   Anesthesia Other Findings   Reproductive/Obstetrics                          Anesthesia Physical Anesthesia Plan  ASA: III  Anesthesia Plan: General   Post-op Pain Management:    Induction: Intravenous  Airway Management Planned: LMA  Additional Equipment:   Intra-op Plan:   Post-operative Plan: Extubation in OR  Informed Consent: I have reviewed the patients History and Physical, chart, labs and discussed the procedure including the risks, benefits and alternatives for the proposed anesthesia with the patient or authorized representative who has indicated his/her understanding and acceptance.   Dental advisory given  Plan Discussed with: CRNA, Anesthesiologist and Surgeon  Anesthesia Plan Comments:         Anesthesia Quick Evaluation

## 2013-07-30 NOTE — Anesthesia Postprocedure Evaluation (Signed)
Anesthesia Post Note  Patient: Paul [name]  Procedure(s) Performed: Procedure(s) (LRB): AMPUTATION BELOW KNEE (Bilateral)  Anesthesia type: general  Patient location: PACU  Post pain: Pain level controlled  Post assessment: Patient's Cardiovascular Status Stable  Last Vitals:  Filed Vitals:   07/30/13 1010  BP: 132/63  Pulse: 78  Temp: 36.9 C  Resp: 18    Post vital signs: Reviewed and stable  Level of consciousness: sedated  Complications: No apparent anesthesia complications

## 2013-07-30 NOTE — Anesthesia Procedure Notes (Addendum)
Procedure Name: LMA Insertion Date/Time: 07/30/2013 8:03 AM Performed by: Williemae Area B Pre-anesthesia Checklist: Patient identified, Emergency Drugs available, Suction available and Patient being monitored Patient Re-evaluated:Patient Re-evaluated prior to inductionOxygen Delivery Method: Circle system utilized Preoxygenation: Pre-oxygenation with 100% oxygen Intubation Type: IV induction Ventilation: Mask ventilation without difficulty LMA: LMA inserted LMA Size: 5.0 Number of attempts: 1 Placement Confirmation: breath sounds checked- equal and bilateral and positive ETCO2 Tube secured with: Tape (taped across cheeks) Dental Injury: Teeth and Oropharynx as per pre-operative assessment

## 2013-07-31 LAB — GLUCOSE, CAPILLARY
GLUCOSE-CAPILLARY: 97 mg/dL (ref 70–99)
GLUCOSE-CAPILLARY: 82 mg/dL (ref 70–99)
GLUCOSE-CAPILLARY: 149 mg/dL — AB (ref 70–99)
Glucose-Capillary: 76 mg/dL (ref 70–99)
Glucose-Capillary: 109 mg/dL — ABNORMAL HIGH (ref 70–99)

## 2013-07-31 MED ORDER — ALTEPLASE 2 MG IJ SOLR
2.0000 mg | Freq: Once | INTRAMUSCULAR | Status: DC | PRN
  Filled 2013-07-31: qty 2

## 2013-07-31 MED ORDER — NEPRO/CARBSTEADY PO LIQD
237.0000 mL | ORAL | Status: DC | PRN
  Filled 2013-07-31: qty 237

## 2013-07-31 MED ORDER — ALTEPLASE 2 MG IJ SOLR: 2.0000 mg | Freq: Once | INTRAMUSCULAR | Status: DC | PRN

## 2013-07-31 MED ORDER — SODIUM CHLORIDE 0.9 % IV SOLN: 100.0000 mL | INTRAVENOUS | Status: DC | PRN

## 2013-07-31 MED ORDER — LIDOCAINE-PRILOCAINE 2.5-2.5 % EX CREA
1.0000 "application " | TOPICAL_CREAM | CUTANEOUS | Status: DC | PRN
  Filled 2013-07-31: qty 5

## 2013-07-31 MED ORDER — LIDOCAINE HCL (PF) 1 % IJ SOLN: 5.0000 mL | INTRAMUSCULAR | Status: DC | PRN

## 2013-07-31 MED ORDER — NEPRO/CARBSTEADY PO LIQD: 237.0000 mL | ORAL | Status: DC | PRN

## 2013-07-31 MED ORDER — LIDOCAINE-PRILOCAINE 2.5-2.5 % EX CREA: 1.0000 "application " | TOPICAL_CREAM | CUTANEOUS | Status: DC | PRN

## 2013-07-31 MED ORDER — HEPARIN SODIUM (PORCINE) 1000 UNIT/ML DIALYSIS: 1000.0000 [IU] | INTRAMUSCULAR | Status: DC | PRN

## 2013-07-31 MED ORDER — PENTAFLUOROPROP-TETRAFLUOROETH EX AERO: 1.0000 "application " | INHALATION_SPRAY | CUTANEOUS | Status: DC | PRN

## 2013-07-31 MED ORDER — HEPARIN SODIUM (PORCINE) 1000 UNIT/ML DIALYSIS: 1700.0000 [IU] | INTRAMUSCULAR | Status: DC | PRN

## 2013-07-31 NOTE — Procedures (Signed)
I was present at this dialysis session, have reviewed the session itself and made  appropriate changes  Kelly Splinter MD (pgr) [phone]    ([phone] 07/31/2013, 10:27 AM

## 2013-07-31 NOTE — Progress Notes (Signed)
  [facility] Progress Note   Subjective: On dialysis , calm , post op pain  Filed Vitals:   07/31/13 0857 07/31/13 0902 07/31/13 0930 07/31/13 1000  BP: 134/74 130/74 145/80 133/76  Pulse: 78 78 82 78  Temp:      TempSrc:      Resp:      Height:      Weight:      SpO2:       Exam: Alert, no distress, calm No jvd  Clear bilat, no rales  RRR soft SEM no RG  Abd soft, nt, nd, no ascites  Bilat BKA w dressings in place, no LE edema  R FA fistula patent  Neuro is nf, ox3  ECHO 07/13/13 > normal study, LV normal fxn  Dialysis: [facility]  4h 81.5kg 2/2.5 Bath RFA AVF Heparin none (due to leg wounds - he has clotted system 2x recentl) > resuming tight hep x 2 then advance as needed  Epo 18000 Na thiosulfate 25gm tiw   Assessment:  1 Gangrene bilat LE's, s/p bilat BKA  2 ESRD on hemodialysis  3 Calciphylaxis- on Na thio w HD  4 Anemia - lab pending  5 MBD cont sevelamer ac  6 Hx afib, not on any Rx  7 DM on insulin  8 Hx HTN- off of BP meds due to orthostatic hypotension  9 Volume- no vol excess, dry wt will need to be lowered from bilat amp  Plan- HD today, resuming heparin gradually.  Will continue Na thio for now as calciphylaxis is to some extent considered to be a systemic disease    Kelly Splinter MD  pager [phone]    cell [phone]  07/31/2013, 10:17 AM     Recent Labs Lab 07/30/13 1402  NA 141  K 4.3  CL 101  CO2 25  GLUCOSE 87  BUN 25*  CREATININE 4.06*  CALCIUM 7.8*  PHOS 3.3    Recent Labs Lab 07/30/13 1402  ALBUMIN 1.3*    Recent Labs Lab 07/30/13 1402  WBC 14.4*  HGB 7.2*  HCT 23.8*  MCV 93.3  PLT 298   . aspirin EC  325 mg Oral Daily  . atorvastatin  10 mg Oral q1800  . gabapentin  300 mg Oral TID  . insulin aspart  0-15 Units Subcutaneous TID WC  . insulin aspart  3 Units Subcutaneous TID WC  . insulin glargine  10 Units Subcutaneous QHS  . multivitamin  1 tablet Oral QHS  . pantoprazole  40 mg Oral Daily  .  sertraline  50 mg Oral Daily  . sevelamer carbonate  1,600 mg Oral TID WC  . sodium thiosulfate  25 g Intravenous Q T,Th,Sa-HD   . sodium chloride 20 mL/hr (07/30/13 1035)   sodium chloride, sodium chloride, alteplase, feeding supplement (NEPRO CARB STEADY), heparin, [START ON 08/01/2013] heparin, HYDROmorphone (DILAUDID) injection, lidocaine (PF), lidocaine-prilocaine, methocarbamol (ROBAXIN) IV, methocarbamol, metoCLOPramide (REGLAN) injection, metoCLOPramide, ondansetron (ZOFRAN) IV, ondansetron, oxyCODONE-acetaminophen, pentafluoroprop-tetrafluoroeth

## 2013-07-31 NOTE — Progress Notes (Signed)
Subjective: 1 Day Post-Op Procedure(s) (LRB): AMPUTATION BELOW KNEE (Bilateral) Patient reports pain as moderate.  Currently in dialysis.  Denies phantom pain thus far.  Does have acute on chronic blood loss anemia.  Objective: Vital signs in last 24 hours: Temp:  [97.7 F (36.5 C)-98.5 F (36.9 C)] 98.5 F (36.9 C) (03/14 0855) Pulse Rate:  [70-81] 78 (03/14 0902) Resp:  [15-20] 18 (03/14 0855) BP: (128-138)/(63-74) 130/74 mmHg (03/14 0902) SpO2:  [93 %-99 %] 93 % (03/14 0855) Weight:  [75.2 kg (165 lb 12.6 oz)] 75.2 kg (165 lb 12.6 oz) (03/14 0855)  Intake/Output from previous day: 03/13 0701 - 03/14 0700 In: 930 [P.O.:300; I.V.:480; IV Piggyback:150] Out: 200 [Blood:200] Intake/Output this shift: Total I/O In: 240 [P.O.:240] Out: -    Recent Labs  07/30/13 1402  HGB 7.2*    Recent Labs  07/30/13 1402  WBC 14.4*  RBC 2.55*  HCT 23.8*  PLT 298    Recent Labs  07/30/13 1402  NA 141  K 4.3  CL 101  CO2 25  BUN 25*  CREATININE 4.06*  GLUCOSE 87  CALCIUM 7.8*   No results found for this basename: LABPT, INR,  in the last 72 hours  Incision: dressing C/D/I  Assessment/Plan: 1 Day Post-Op Procedure(s) (LRB): AMPUTATION BELOW KNEE (Bilateral) Will likely need placement post-hospitalization.  Mcarthur Rossetti 07/31/2013, 9:33 AM

## 2013-07-31 NOTE — Progress Notes (Signed)
07/31/13 0830  PT Visit Information  Last PT Received On 07/31/13  Assistance Needed +1  History of Present Illness 58 y.o. year-old with ESRD, DM, HTN and afib who is post-op bilat LE BKA  PT Time Calculation  PT Start Time 0815  PT Stop Time 0830  PT Time Calculation (min) 15 min  Precautions  Precautions Fall  Restrictions  Weight Bearing Restrictions Yes  RLE Weight Bearing NWB  LLE Weight Bearing NWB  Cognition  Arousal/Alertness Awake/alert  Behavior During Therapy WFL for tasks assessed/performed  Overall Cognitive Status Within Functional Limits for tasks assessed  Bed Mobility  Overal bed mobility (Not tested)  Transfers  Overall transfer level (not tested)  General Comments  General comments (skin integrity, edema, etc.) Began with exercises in bed woth pt when transporter for HD came to get pt therefore limited session to exercises in bed  Exercises  Exercises Amputee  Amputee Exercises  Quad Sets AROM;Both;Supine;5 reps  Hip Extension AROM;Sidelying;10 reps;Both  Hip ABduction/ADduction AROM;Both;10 reps;Sidelying  Hip Flexion/Marching AROM;Both;10 reps;Supine (Combined withknee flexion)  PT - End of Session  Activity Tolerance Patient tolerated treatment well  Patient left in bed;with call bell/phone within reach  PT General Charges  $$ ACUTE PT VISIT 1 Procedure  PT Treatments  $Therapeutic Exercise 8-22 mins  Late entry from this AM due to pt in HD. [place], [place]  [phone] 07/31/2013

## 2013-07-31 NOTE — Progress Notes (Signed)
Physical Therapy Treatment Patient Details Name: Paul [name] MRN: [id number] DOB: Oct 05, 1955 Today's Date: 07/31/2013 Time: [id number] PT Time Calculation (min): 29 min  PT Assessment / Plan / Recommendation  History of Present Illness 58 y.o. year-old with ESRD, DM, HTN and afib who is post-op bilat LE BKA   PT Comments   Pt's family present for treatment. Pt making progress with mobility. Will benefit from CIR to maximize mobility rior to DC home at a WC level. Pts home will likely need modifications to accomadate a WC.   Follow Up Recommendations  CIR;Supervision/Assistance - 24 hour     Does the patient have the potential to tolerate intense rehabilitation     Barriers to Discharge        Equipment Recommendations  Wheelchair (measurements PT);Wheelchair cushion (measurements PT);Other (comment)    Recommendations for Other Services    Frequency     Progress towards PT Goals Progress towards PT goals: Progressing toward goals  Plan Current plan remains appropriate    Precautions / Restrictions Precautions Precautions: Fall Restrictions Weight Bearing Restrictions: Yes RLE Weight Bearing: Non weight bearing LLE Weight Bearing: Non weight bearing   Pertinent Vitals/Pain C/o residual limb pain but did not rate.  States moving makes the pain better. States he had pain meds prior to PT arrival this pm.     Mobility  Bed Mobility Overal bed mobility: Needs Assistance Bed Mobility: Supine to Sit Supine to sit: Min assist General bed mobility comments: With increased time pt able to turn in bed in long sitting Transfers Overall transfer level: Needs assistance Equipment used: None Transfers: Government social research officer transfers: +2 physical assistance;Mod assist General transfer comment: with increased time pt able to scoot from bed into chair without physical assist (slightly downhill).  To return from bed to chair pt required 2 person assist to go  over the lip from the chair into the bed.  Pt with more difficulty scooting forward than back.       Exercises     PT Diagnosis:    PT Problem List:   PT Treatment Interventions:     PT Goals (current goals can now be found in the care plan section)    Visit Information  Last PT Received On: 07/31/13 Assistance Needed: +2 History of Present Illness: 58 y.o. year-old with ESRD, DM, HTN and afib who is post-op bilat LE BKA    Subjective Data      Cognition  Cognition Arousal/Alertness: Awake/alert Behavior During Therapy: WFL for tasks assessed/performed Overall Cognitive Status: Within Functional Limits for tasks assessed    Balance  Balance Overall balance assessment: Needs assistance Sitting-balance support: No upper extremity supported;Feet unsupported Sitting balance-Leahy Scale: Fair Sitting balance - Comments: Pt able to sit statically without LOB.  Practiced weight shifting and reaching while sitting EOB. General Comments General comments (skin integrity, edema, etc.): Pts wife and son present for entire session this pm    End of Session PT - End of Session Activity Tolerance: Patient tolerated treatment well Patient left: in bed;with call bell/phone within reach;with family/visitor present Nurse Communication: Mobility status   GP     Melvern Banker 07/31/2013, 4:26 PM Lavonia Dana, [place] 07/31/2013

## 2013-07-31 NOTE — Progress Notes (Signed)
OT Note Attempted to see pt this am. Pt in HD. Will return this pm if able. [facility], OTR/L  [phone] 07/31/2013

## 2013-08-01 LAB — GLUCOSE, CAPILLARY
GLUCOSE-CAPILLARY: 160 mg/dL — AB (ref 70–99)
GLUCOSE-CAPILLARY: 130 mg/dL — AB (ref 70–99)
Glucose-Capillary: 117 mg/dL — ABNORMAL HIGH (ref 70–99)
Glucose-Capillary: 178 mg/dL — ABNORMAL HIGH (ref 70–99)

## 2013-08-01 MED ORDER — BOOST / RESOURCE BREEZE PO LIQD
1.0000 | Freq: Three times a day (TID) | ORAL | Status: DC
  Administered 2013-08-01 – 2013-08-03 (×6): 1 via ORAL

## 2013-08-01 MED ORDER — DARBEPOETIN ALFA-POLYSORBATE 150 MCG/0.3ML IJ SOLN
150.0000 ug | INTRAMUSCULAR | Status: DC
  Administered 2013-08-03: 150 ug via INTRAVENOUS
  Filled 2013-08-01: qty 0.3

## 2013-08-01 NOTE — Progress Notes (Signed)
Subjective: 2 Days Post-Op Procedure(s) (LRB): AMPUTATION BELOW KNEE (Bilateral) Patient reports pain as moderate.    Objective: Vital signs in last 24 hours: Temp:  [97.7 F (36.5 C)-99.1 F (37.3 C)] 98.6 F (37 C) (03/15 [id number]) Pulse Rate:  [78-102] 80 (03/15 0608) Resp:  [18] 18 (03/15 0608) BP: (129-145)/(64-84) 136/64 mmHg (03/15 0608) SpO2:  [97 %-100 %] 97 % (03/15 0608) Weight:  [75.2 kg (165 lb 12.6 oz)] 75.2 kg (165 lb 12.6 oz) (03/14 0855)  Intake/Output from previous day: 03/14 0701 - 03/15 0700 In: 840 [P.O.:840] Out: 2512  Intake/Output this shift:     Recent Labs  07/30/13 1402  HGB 7.2*    Recent Labs  07/30/13 1402  WBC 14.4*  RBC 2.55*  HCT 23.8*  PLT 298    Recent Labs  07/30/13 1402  NA 141  K 4.3  CL 101  CO2 25  BUN 25*  CREATININE 4.06*  GLUCOSE 87  CALCIUM 7.8*   No results found for this basename: LABPT, INR,  in the last 72 hours  Incision: dressing C/D/I Compartment soft Full extension bilateral knees  Assessment/Plan: 2 Days Post-Op Procedure(s) (LRB): AMPUTATION BELOW KNEE (Bilateral) Up with therapy to chair  CLARK, [place] 08/01/2013, 8:18 AM

## 2013-08-01 NOTE — Progress Notes (Signed)
Clinical Social Work Department BRIEF PSYCHOSOCIAL ASSESSMENT 08/01/2013  Patient:  Paul [name], Paul [name]     Account Number:  [id number]     Admit date:  07/30/2013  Clinical Social Worker:  Rolinda Roan  Date/Time:  08/01/2013 07:33 PM  Referred by:  Physician  Date Referred:  07/30/2013 Referred for  SNF Placement   Other Referral:   Interview type:  Patient Other interview type:    PSYCHOSOCIAL DATA Living Status:  WIFE Admitted from facility:   Level of care:   Primary support name:  Carolin Coy Primary support relationship to patient:  SPOUSE Degree of support available:   Good support.    CURRENT CONCERNS  Other Concerns:    SOCIAL WORK ASSESSMENT / PLAN Clinical Social Worker (CSW) met with patient to discuss SNF placement. Patient reported that his first choice is CIR and he was at [facility] in Jan. 2015. Patient is agreeable to SNF search in [facility] as a back up to CIR. Patient did not have a preference of facility.   Assessment/plan status:  Psychosocial Support/Ongoing Assessment of Needs Other assessment/ plan:   Information/referral to community resources:   CSW gave patient SNF list.    PATIENT'S/FAMILY'S RESPONSE TO PLAN OF CARE: Patient thanked CSW for visit.

## 2013-08-01 NOTE — Progress Notes (Signed)
Subjective:  No co eating lunch. Tolerated hd yesterday Objective Vital signs in last 24 hours: Filed Vitals:   07/31/13 1257 07/31/13 1402 07/31/13 2126 08/01/13 0608  BP: 140/84 134/66 136/67 136/64  Pulse: 96 102 79 80  Temp: 97.7 F (36.5 C) 99.1 F (37.3 C) 98.2 F (36.8 C) 98.6 F (37 C)  TempSrc: Oral  Oral Oral  Resp: 18 18 18 18   Height:      Weight:      SpO2: 100% 97% 99% 97%  Exam:  Alert, calm no distress No jvd  CTA bilat,  RRR soft 1/6  SEM lsb  no RG  Abd soft, nt, nd  Bilat BKA w dressings in place dry / clean with out edema  R FA fistula pos. bruit   Dialysis: [facility]  4h 81.5kg 2/2.5 Bath RFA AVF Heparin none (due to leg wounds - he has clotted system 2x recentl) > resuming tight hep x 2 then advance as needed  Epo 18000 Na thiosulfate 25gm tiw   Assessment:  1 Gangrene bilat LE's, s/p bilat BKA = ? In hosp Rehab admit 2 ESRD on hemodialysis  3 Calciphylaxis- on Na thio w HD  4 Anemia - hgb 7.2 off aranesp ,needs to restart and fu iron levels 5 MBD  On  2 sevelamer ac  Phos 3.3/ Ca corrected 9.1 6 Hx afib, not on any Rx  7 DM on insulin  8 Hx HTN- off of BP meds due to orthostatic hypotension  9 Volume- no vol excess, dry wt will need to be lowered from bilat amp  10 Nutrition- alb 1.3  On renal /carb diet and renavite  Add breeze  For protein supplement Plan- HD  On schedule fu labs next hd/ resuming heparin gradually. Will continue Na thio for now as calciphylaxis is to some extent considered to be a systemic disease/ fu hgb  Needs esa net hd  Ernest Haber, PA-C [facility] [phone] 08/01/2013,11:34 AM  LOS: 2 days   Pt seen, examined and agree w A/P as above.  Kelly Splinter MD pager [phone]    cell [phone] 08/01/2013, 1:10 PM    Labs: Basic Metabolic Panel:  Recent Labs Lab 07/30/13 1402  NA 141  K 4.3  CL 101  CO2 25  GLUCOSE 87  BUN 25*  CREATININE 4.06*  CALCIUM 7.8*  PHOS 3.3   Liver Function  Tests:  Recent Labs Lab 07/30/13 1402  ALBUMIN 1.3*    Recent Labs Lab 07/30/13 1402  WBC 14.4*  HGB 7.2*  HCT 23.8*  MCV 93.3  PLT 298   Cardiac Enzymes: No results found for this basename: CKTOTAL, CKMB, CKMBINDEX, TROPONINI,  in the last 168 hours CBG:  Recent Labs Lab 07/31/13 1359 07/31/13 1602 07/31/13 2143 07/31/13 2307 08/01/13 0623  GLUCAP 82 149* 76 109* 160*    Studies/Results: No results found. Medications: . sodium chloride Stopped (07/31/13 0745)   . aspirin EC  325 mg Oral Daily  . atorvastatin  10 mg Oral q1800  . gabapentin  300 mg Oral TID  . insulin aspart  0-15 Units Subcutaneous TID WC  . insulin aspart  3 Units Subcutaneous TID WC  . insulin glargine  10 Units Subcutaneous QHS  . multivitamin  1 tablet Oral QHS  . pantoprazole  40 mg Oral Daily  . sertraline  50 mg Oral Daily  . sevelamer carbonate  1,600 mg Oral TID WC  . sodium thiosulfate  25 g  Intravenous Q T,Th,Sa-HD

## 2013-08-01 NOTE — Progress Notes (Signed)
Clinical Social Work Department CLINICAL SOCIAL WORK PLACEMENT NOTE 08/01/2013  Patient:  Paul [name], Paul [name]  Account Number:  [id number] Admit date:  07/30/2013  Clinical Social Worker:  Blima Rich, Latanya Presser  Date/time:  08/01/2013 07:37 PM  Clinical Social Work is seeking post-discharge placement for this patient at the following level of care:   [place]   (*CSW will update this form in Epic as items are completed)   08/01/2013  Patient/family provided with [place] Department of Clinical Social Work's list of facilities offering this level of care within the geographic area requested by the patient (or if unable, by the patient's family).  08/01/2013  Patient/family informed of their freedom to choose among providers that offer the needed level of care, that participate in Medicare, Medicaid or managed care program needed by the patient, have an available bed and are willing to accept the patient.  08/01/2013  Patient/family informed of [facility]' ownership interest in [facility], as well as of the fact that they are under no obligation to receive care at this facility.  PASARR submitted to EDS on 08/01/2013 PASARR number received from EDS on 08/01/2013  FL2 transmitted to all facilities in geographic area requested by pt/family on  08/01/2013 FL2 transmitted to all facilities within larger geographic area on   Patient informed that his/her managed care company has contracts with or will negotiate with  certain facilities, including the following:     Patient/family informed of bed offers received:   Patient chooses bed at  Physician recommends and patient chooses bed at    Patient to be transferred to  on   Patient to be transferred to facility by   The following physician request were entered in Epic:   Additional Comments:

## 2013-08-01 NOTE — Evaluation (Signed)
Occupational Therapy Evaluation Patient Details Name: Paul [name] MRN: [id number] DOB: 22-Mar-1956 Today's Date: 08/01/2013 Time: [id number] OT Time Calculation (min): 27 min  OT Assessment / Plan / Recommendation History of present illness 58 y.o. year-old with ESRD, DM, HTN and afib who is post-op bilat LE BKA   Clinical Impression   Pt presents with below problem list. Feel pt will benefit from acute OT to increase independence prior to d/c. Recommending CIR for additional rehab.    OT Assessment  Patient needs continued OT Services    Follow Up Recommendations  CIR    Barriers to Discharge      Equipment Recommendations  Tub/shower bench    Recommendations for Other Services Rehab consult  Frequency  Min 2X/week    Precautions / Restrictions Precautions Precautions: Fall Precaution Booklet Issued: No Precaution Comments: Reviewed precautions with pt Restrictions Weight Bearing Restrictions: Yes RLE Weight Bearing: Non weight bearing LLE Weight Bearing: Non weight bearing   Pertinent Vitals/Pain Pain 2/10. Nurse notified of pain.     ADL  Eating/Feeding: Independent Where Assessed - Eating/Feeding: Chair Grooming: Set up Where Assessed - Grooming: Supported sitting Upper Body Bathing: Set up;Supervision/safety Where Assessed - Upper Body Bathing: Supported sitting Lower Body Bathing: Minimal assistance Where Assessed - Lower Body Bathing: Lean right and/or left Upper Body Dressing: Set up;Supervision/safety Where Assessed - Upper Body Dressing: Supported sitting Lower Body Dressing: Minimal assistance Where Assessed - Lower Body Dressing: Lean right and/or left Toilet Transfer: Minimal assistance Toilet Transfer Method: Anterior-posterior Toilet Transfer Equipment: Other (comment) (from bed to chair) Toileting - Clothing Manipulation and Hygiene: Moderate assistance Where Assessed - Toileting Clothing Manipulation and Hygiene: Lean right and/or  left Tub/Shower Transfer Method: Not assessed Equipment Used: Gait belt Transfers/Ambulation Related to ADLs: Min A ADL Comments: Educated on sensations pt may experience. Educated to be tapping/rubbing residual limbs. Pt practiced donning shorts and discussed he could either lean side to side or roll in bed to get them over bottom. Pt asking about bathing-OT explained tub transfer technique and use of tub bench. Gave pt theraband and showed him some exercises to be doing for UE strength. Re-wrapped bottom of bandage on RLE as it appeared circular.    OT Diagnosis: Acute pain  OT Problem List: Decreased strength;Decreased activity tolerance;Impaired sensation;Pain;Decreased knowledge of use of DME or AE;Decreased knowledge of precautions;Decreased range of motion OT Treatment Interventions: Self-care/ADL training;Therapeutic exercise;DME and/or AE instruction;Therapeutic activities;Patient/family education;Balance training   OT Goals(Current goals can be found in the care plan section) Acute Rehab OT Goals Patient Stated Goal: not stated OT Goal Formulation: With patient Time For Goal Achievement: 08/08/13 Potential to Achieve Goals: Good ADL Goals Pt Will Perform Lower Body Dressing: with supervision;sitting/lateral leans;bed level Pt Will Transfer to Toilet: with supervision;anterior/posterior transfer;with transfer board (drop arm commode) Pt Will Perform Toileting - Clothing Manipulation and hygiene: with supervision;sitting/lateral leans;bed level Additional ADL Goal #1: Pt will independently perform HEP to increase strength in bilateral UE's.  Visit Information  Last OT Received On: 08/01/13 Assistance Needed: +2 (for transfer chair to bed; bed to chair +1) History of Present Illness: 58 y.o. year-old with ESRD, DM, HTN and afib who is post-op bilat LE BKA       Prior Functioning     Home Living Family/patient expects to be discharged to:: Inpatient rehab Living Arrangements:  Spouse/significant other Available Help at Discharge: Family Type of Home: House Home Access: Stairs to enter [employer] of Steps: 4-5 Entrance Stairs-Rails: [place]  Layout: Multi-level;Other (Comment) (W/c not able to fit in first level bed/bath) Alternate Level Stairs-Number of Steps: 1 step to get into living room Alternate Level Stairs-Rails: None Home Equipment: Bedside commode;Walker - 2 wheels;Cane - single point;Crutches;Shower seat Additional Comments: Downstairs bathroom with double doors, very wide space with exception of area around toilet. May not be able to enter bedroom on first floor with W/c  Prior Function Level of Independence: Needs assistance Gait / Transfers Assistance Needed: uses a RW ADL's / Homemaking Assistance Needed: able to dress/bath self Comments: Needed assist with climbing stairs Communication Communication: No difficulties Dominant Hand: Right         Vision/Perception     Cognition  Cognition Arousal/Alertness: Awake/alert Behavior During Therapy: WFL for tasks assessed/performed Overall Cognitive Status: Within Functional Limits for tasks assessed    Extremity/Trunk Assessment Upper Extremity Assessment Upper Extremity Assessment: Overall WFL for tasks assessed (would benefit to increase strength in UE's to assist with transfers) Lower Extremity Assessment Lower Extremity Assessment: Defer to PT evaluation     Mobility Bed Mobility Overal bed mobility: Needs Assistance Bed Mobility: Supine to Sit Supine to sit: Min guard General bed mobility comments: Increased time Transfers Overall transfer level: Needs assistance Equipment used: None Transfers: Government social research officer transfers: Min assist General transfer comment: Pt transferred from bed to chair. Increased time needed and Min A.     Exercise     Balance General Comments General comments (skin integrity, edema, etc.): helped  adjust/re-wrap bottom of bandage on RLE as it appeared circular-nurse notified.   End of Session OT - End of Session Equipment Utilized During Treatment: Gait belt Activity Tolerance: Patient tolerated treatment well Patient left: in chair;with call bell/phone within reach Nurse Communication: Mobility status  GO     Benito Mccreedy OTR/L [id number] 08/01/2013, 3:20 PM

## 2013-08-02 ENCOUNTER — Ambulatory Visit: Payer: Managed Care, Other (non HMO) | Admitting: Physical Therapy

## 2013-08-02 DIAGNOSIS — S88119A Complete traumatic amputation at level between knee and ankle, unspecified lower leg, initial encounter: Secondary | ICD-10-CM

## 2013-08-02 DIAGNOSIS — D649 Anemia, unspecified: Secondary | ICD-10-CM

## 2013-08-02 DIAGNOSIS — N186 End stage renal disease: Secondary | ICD-10-CM

## 2013-08-02 DIAGNOSIS — I739 Peripheral vascular disease, unspecified: Secondary | ICD-10-CM

## 2013-08-02 DIAGNOSIS — L98499 Non-pressure chronic ulcer of skin of other sites with unspecified severity: Secondary | ICD-10-CM

## 2013-08-02 DIAGNOSIS — E1142 Type 2 diabetes mellitus with diabetic polyneuropathy: Secondary | ICD-10-CM

## 2013-08-02 DIAGNOSIS — I70269 Atherosclerosis of native arteries of extremities with gangrene, unspecified extremity: Secondary | ICD-10-CM

## 2013-08-02 DIAGNOSIS — E1149 Type 2 diabetes mellitus with other diabetic neurological complication: Secondary | ICD-10-CM

## 2013-08-02 LAB — CBC
HCT: 27.5 % — ABNORMAL LOW (ref 39.0–52.0)
Hemoglobin: 8.2 g/dL — ABNORMAL LOW (ref 13.0–17.0)
MCH: 27.3 pg (ref 26.0–34.0)
MCHC: 29.8 g/dL — AB (ref 30.0–36.0)
MCV: 91.7 fL (ref 78.0–100.0)
Platelets: 424 10*3/uL — ABNORMAL HIGH (ref 150–400)
RBC: 3 MIL/uL — ABNORMAL LOW (ref 4.22–5.81)
RDW: 17.5 % — AB (ref 11.5–15.5)
WBC: 14.9 10*3/uL — ABNORMAL HIGH (ref 4.0–10.5)

## 2013-08-02 LAB — GLUCOSE, CAPILLARY
GLUCOSE-CAPILLARY: 127 mg/dL — AB (ref 70–99)
GLUCOSE-CAPILLARY: 142 mg/dL — AB (ref 70–99)
GLUCOSE-CAPILLARY: 161 mg/dL — AB (ref 70–99)
Glucose-Capillary: 130 mg/dL — ABNORMAL HIGH (ref 70–99)
Glucose-Capillary: 174 mg/dL — ABNORMAL HIGH (ref 70–99)
Glucose-Capillary: 163 mg/dL — ABNORMAL HIGH (ref 70–99)
Glucose-Capillary: 170 mg/dL — ABNORMAL HIGH (ref 70–99)
Glucose-Capillary: 151 mg/dL — ABNORMAL HIGH (ref 70–99)
Glucose-Capillary: 128 mg/dL — ABNORMAL HIGH (ref 70–99)
Glucose-Capillary: 175 mg/dL — ABNORMAL HIGH (ref 70–99)
Glucose-Capillary: 128 mg/dL — ABNORMAL HIGH (ref 70–99)

## 2013-08-02 LAB — IRON AND TIBC
IRON: 11 ug/dL — AB (ref 42–135)
Saturation Ratios: 15 % — ABNORMAL LOW (ref 20–55)
TIBC: 73 ug/dL — ABNORMAL LOW (ref 215–435)
UIBC: 62 ug/dL — ABNORMAL LOW (ref 125–400)

## 2013-08-02 MED ORDER — SODIUM CHLORIDE 0.9 % IV SOLN
125.0000 mg | INTRAVENOUS | Status: DC
  Administered 2013-08-03: 125 mg via INTRAVENOUS
  Filled 2013-08-02 (×2): qty 10

## 2013-08-02 NOTE — Progress Notes (Signed)
Physical Therapy Treatment Patient Details Name: Paul [name] MRN: [id number] DOB: February 02, 1956 Today's Date: 08/02/2013 Time: [id number] PT Time Calculation (min): 33 min  PT Assessment / Plan / Recommendation  History of Present Illness 58 y.o. year-old with ESRD, DM, HTN and afib who is post-op bilat LE BKA   PT Comments   Patient continues progress well towards goals, working towards independence with transfers. He is very compliant with exercises and highly motivated. Pt will benefit from continued skilled physical therapy and continue to recommend CIR for best functional outcomes.  Follow Up Recommendations  CIR;Supervision/Assistance - 24 hour     Does the patient have the potential to tolerate intense rehabilitation     Barriers to Discharge        Equipment Recommendations  Wheelchair (measurements PT);Wheelchair cushion (measurements PT);Other (comment)    Recommendations for Other Services Rehab consult;OT consult  Frequency Min 3X/week   Progress towards PT Goals Progress towards PT goals: Progressing toward goals  Plan Current plan remains appropriate    Precautions / Restrictions Precautions Precautions: Fall Precaution Booklet Issued: No Restrictions Weight Bearing Restrictions: Yes RLE Weight Bearing: Non weight bearing LLE Weight Bearing: Non weight bearing   Pertinent Vitals/Pain Pain noted at 5/10 Nurse entered room towards end of therapy to administer medications Pt repositioned upright in chair for comfort    Mobility  Bed Mobility Overal bed mobility: Needs Assistance Bed Mobility: Rolling;Supine to Sit Rolling: Supervision Supine to sit: Mod assist General bed mobility comments: Pt able to roll into prone position with extra time and verbal cues for technique. Supine>sit requires Mod A for trunk control . Transfers Overall transfer level: Needs assistance Equipment used: None Transfers: Government social research officer transfers:  Min assist General transfer comment: Pt able to perform 75% of transfer to chair from bed with downward slope from bed into chair, requiring physical assist for final positioning in reclining chair.    Exercises Amputee Exercises Quad Sets: AROM;Both;Supine;5 reps Hip Extension: AROM;10 reps;Both;Prone Hip ABduction/ADduction: AROM;Both;10 reps;Supine Knee Flexion: AROM;Both;10 reps;Seated Straight Leg Raises: AROM;10 reps;Supine;Both   PT Diagnosis:    PT Problem List:   PT Treatment Interventions:     PT Goals (current goals can now be found in the care plan section) Acute Rehab PT Goals PT Goal Formulation: With patient/family Time For Goal Achievement: 08/06/13 Potential to Achieve Goals: Good  Visit Information  Last PT Received On: 08/02/13 Assistance Needed: +1 History of Present Illness: 58 y.o. year-old with ESRD, DM, HTN and afib who is post-op bilat LE BKA    Subjective Data  Subjective: Pt states he has been performing exercises regularly over the weekend   Cognition  Cognition Arousal/Alertness: Awake/alert Behavior During Therapy: WFL for tasks assessed/performed Overall Cognitive Status: Within Functional Limits for tasks assessed    Balance  Balance Overall balance assessment: Needs assistance Sitting-balance support: No upper extremity supported Sitting balance-Leahy Scale: Fair Sitting balance - Comments: Pt progressed to no UE for support while sitting EOB  End of Session PT - End of Session Activity Tolerance: Patient tolerated treatment well Patient left: with call bell/phone within reach;in chair Nurse Communication: Mobility status   GP    [place], [place]  Ellouise Newer 08/02/2013, 2:40 PM

## 2013-08-02 NOTE — Progress Notes (Signed)
Rehab admissions - Evaluated for possible admission.  I met with patient and gave him rehab brochures.  Patient known to me from previous rehab stay.  I have sent information to [facility] requesting acute inpatient rehab admission.  Will follow up once I hear back from [place].  Call me for questions.  #[phone]

## 2013-08-02 NOTE — Progress Notes (Signed)
Admit: 07/30/2013 LOS: 3  30M with nonhealing b/l leg wounds s/p b/l BKAs 07/30/13.  Concern is for DM/PVD vs Calciphylaxis (currently on Na thiosulfate with HD)  Subjective:  NAEON Doing well Likely to CIR   03/15 0701 - 03/16 0700 In: 720 [P.O.:720] Out: 0   Filed Weights   07/30/13 0707 07/31/13 0855  Weight: 86.183 kg (190 lb) 75.2 kg (165 lb 12.6 oz)    Current meds: reviewed  Current Labs: reviewed   Outpt HD Orders Unit: [facility] Days: TTS Time: 4h EDW: TBD K/Ca: 2/2.5 Access: RFA AVF EPO: 18k qTx  Physical Exam:  Blood pressure 138/67, pulse 82, temperature 99.9 F (37.7 C), temperature source Oral, resp. rate 18, height 6\' 2"  (1.88 m), weight 75.2 kg (165 lb 12.6 oz), SpO2 97.00%. NAD. Lying in bed RRR CTAB S/nt/nd, +bs Bandaged b/l legs at midshin No rashes/lesions Nonfocal Nl mood/affect  Assessment/Plan 1. ESRD on iHD THS via RFA AVF: Keep on schedule.  Will do tight heparin x2 then move to std hep.  2Ca bath.  2. Leg wounds with concern for calciphylaxis: on thiosulfate 25gm qHD.  Had bx in 05/2013 of wound "consistent with calciphylaxis".  Have to consdier could have been PVD 3. Anemia: TSAT low at 15% and Hb 8.2 on 150 Aranesp qTues.  Start Fe load.  No overt infection. 4. MBD: 2Ca bath.  No VDRA.  Phos controlled.   5. Vol: need to define new EDW.   6. DM on insulin  Pearson Grippe MD 08/02/2013, 11:20 AM   Recent Labs Lab 07/30/13 1402  NA 141  K 4.3  CL 101  CO2 25  GLUCOSE 87  BUN 25*  CREATININE 4.06*  CALCIUM 7.8*  PHOS 3.3    Recent Labs Lab 07/30/13 1402 08/02/13 0514  WBC 14.4* 14.9*  HGB 7.2* 8.2*  HCT 23.8* 27.5*  MCV 93.3 91.7  PLT 298 424*

## 2013-08-02 NOTE — Progress Notes (Signed)
Patient ID: Paul [name], male   DOB: [birth year], 59 y.o.   MRN: [id number] Patient is status post bilateral transtibial amputation. Patient is comfortable without complaints. The dressings are clean and dry. Patient is under consideration for inpatient rehabilitation versus outpatient rehabilitation.

## 2013-08-02 NOTE — Consult Note (Signed)
Physical Medicine and Rehabilitation Consult Reason for Consult: Bilateral BKA Referring Physician: Dr. Sharol Given  HPI: Paul [name] is a 58 y.o. right-handed male with history of end-stage renal disease on hemodialysis, insulin-dependent diabetes mellitus with peripheral neuropathy, history of atrial fibrillation with Coumadin discontinued secondary to Coumadin necrosis. Patient received inpatient rehabilitation services January 2015 for multifactorial gait disorder related to diabetes mellitus with peripheral neuropathy. He was discharged to home ambulating modified independence using a rolling walker. Admitted 07/30/2013 with extensive gangrene bilateral lower extremities. Patient had been followed at the wound Center. Limb not felt to be salvageable. I know a bilateral lower extremity below-knee amputation 07/30/2013 per Dr. Sharol Given. Postoperative pain management. Hemodialysis ongoing as per renal services. Physical and occupational therapy evaluations completed with recommendations of physical medicine rehabilitation consult.   Review of Systems  Cardiovascular: Positive for palpitations and leg swelling.  Gastrointestinal:       GERD  Psychiatric/Behavioral: Positive for depression.  All other systems reviewed and are negative.   Past Medical History  Diagnosis Date  . Open wound of both legs with complication     Pt has had progressive wounds of both LE's including gangrene of the toes and patchy necrosis of the calves.  He has been treated at [facility] by Dr Jerline Pain with hyperbaric O2 5d per week.  He is getting Na Thiosulfate with HD for suspected calciphylaxis. Pt says doctor's aren't sure if these ulcers were diabetic ulcers or calciphylaxis.  He underwent bilat BKA on 07/30/13.  He says that the woun  . ESRD on hemodialysis     Pt has ESRD due to DM.  He had a L upper arm AVF prior to starting HD which was ligated due to L arm steal syndrome.  He started HD in Jan 2014 and did  home HD.  The family couldn't cannulate the R arm AVF successfully so by mid 2014 they decided to switch to PD which was done late summer 2014.  In Jan 2015 he was admitted with FTT felt to be due to underdialysis on PD and PD was abandoned and he   . Anemia     esrd  . Hypertension     off of meds due to orthostatic hypotension  . Closed left arm fracture 1967  . Heart murmur   . Corneal abrasion, left   . GERD (gastroesophageal reflux disease)   . History of blood transfusion   . Glomerulosclerosis, diabetic   . Depression   . A-fib   . Calciphylaxis 05/2013    lower ext  . Diabetes mellitus     Type 1 iddm x 11 yrs  . Diabetic neuropathy   . Cancer     hx duodenal adenoCa 2012, resected   Past Surgical History  Procedure Laterality Date  . Whipple procedure  2002    duodenal ca, Dr. Harlow Asa  . Bone marrow biopsy  2012  . Renal biopsy, percutaneous  2012  . Tonsillectomy    . Portacath placement  2002  . Biliary diversion, external  2002  . Other surgical history      Cyst removed from back  . Av fistula placement  06/07/2011    Procedure: ARTERIOVENOUS (AV) FISTULA CREATION;  Surgeon: Angelia Mould, MD;  Location: [place];  Service: Vascular;  Laterality: Left;  . Ligation of arteriovenous  fistula  05/22/2012    Procedure: LIGATION OF ARTERIOVENOUS  FISTULA;  Surgeon: Mal Misty, MD;  Location: [facility];  Service: Vascular;  Laterality: Left;  Left brachio-cephalic fistula  . Insertion of dialysis catheter  05/22/2012    Procedure: INSERTION OF DIALYSIS CATHETER;  Surgeon: Mal Misty, MD;  Location: [place];  Service: Vascular;  Laterality: N/A;  right internal jugular vein  . Av fistula placement  06/18/2012    Procedure: ARTERIOVENOUS (AV) FISTULA CREATION;  Surgeon: Mal Misty, MD;  Location: [place];  Service: Vascular;  Laterality: Right;  [place]  . Colonoscopy      Hx: of  . Capd insertion N/A 01/14/2013    Procedure: LAPAROSCOPIC INSERTION CONTINUOUS AMBULATORY  PERITONEAL DIALYSIS  (CAPD) CATHETER;  Surgeon: Adin Hector, MD;  Location: [place];  Service: General;  Laterality: N/A;  . Vascular surgery    . Removal of a dialysis catheter    . Capd removal N/A 07/09/2013    Procedure: CONTINUOUS AMBULATORY PERITONEAL DIALYSIS  (CAPD) CATHETER REMOVAL;  Surgeon: Adin Hector, MD;  Location: [place];  Service: General;  Laterality: N/A;   Family History  Problem Relation Age of Onset  . Cancer Mother   . Cancer Father     prostate cancer  . Heart disease Maternal Grandfather   . Stroke Paternal Grandmother    Social History:  reports that he has never smoked. He has never used smokeless tobacco. He reports that he does not drink alcohol or use illicit drugs. Allergies:  Allergies  Allergen Reactions  . Metformin And Related Other (See Comments)    Kidney problems   Medications Prior to Admission  Medication Sig Dispense Refill  . aspirin EC 81 MG tablet Take 81 mg by mouth daily.      [name] atorvastatin (LIPITOR) 10 MG tablet Take 10 mg by mouth daily.      . collagenase (SANTYL) ointment Apply 1 application topically daily.      [name] doxycycline (VIBRA-TABS) 100 MG tablet Take 100 mg by mouth daily. For 6 days.  Started 07/28/2013      . gabapentin (NEURONTIN) 300 MG capsule Take 300 mg by mouth at bedtime.      . Insulin Glargine (LANTUS SOLOSTAR) 100 UNIT/ML Solostar Pen Inject 5-10 Units into the skin 2 (two) times daily. Inject 5 units every morning and 10 units every night      . multivitamin (RENA-VIT) TABS tablet Take 1 tablet by mouth at bedtime.      . nitroGLYCERIN (NITRODUR - DOSED IN MG/24 HR) 0.1 mg/hr patch Place 0.2 mg onto the skin daily. One patch on each foot. Leave on for 12 hours, then remove for 12 hours      . omeprazole (PRILOSEC) 40 MG capsule Take 40 mg by mouth daily.      [name] oxyCODONE-acetaminophen (PERCOCET) 10-325 MG per tablet Take 1 tablet by mouth 2 (two) times daily as needed for pain.      [name] sertraline (ZOLOFT) 50 MG  tablet Take 50 mg by mouth daily.      . sevelamer carbonate (RENVELA) 800 MG tablet Take 1,600 mg by mouth 3 (three) times daily with meals.      . triamcinolone cream (KENALOG) 0.1 % Apply 1 application topically daily as needed (pain).         Home: Home Living Family/patient expects to be discharged to:: Inpatient rehab Living Arrangements: Spouse/significant other Available Help at Discharge: Family Type of Home: House Home Access: Stairs to enter [employer] of Steps: 4-5 Entrance Stairs-Rails: Right;Left Home Layout: Multi-level;Other (Comment) (W/c not  able to fit in first level bed/bath) Alternate Level Stairs-Number of Steps: 1 step to get into living room Alternate Level Stairs-Rails: None Home Equipment: Bedside commode;Walker - 2 wheels;Cane - single point;Crutches;Shower seat Additional Comments: Downstairs bathroom with double doors, very wide space with exception of area around toilet. May not be able to enter bedroom on first floor with W/c   Functional History: Prior Function Comments: Needed assist with climbing stairs Functional Status:  Mobility:          ADL: ADL Eating/Feeding: Independent Where Assessed - Eating/Feeding: Chair Grooming: Set up Where Assessed - Grooming: Supported sitting Upper Body Bathing: Set up;Supervision/safety Where Assessed - Upper Body Bathing: Supported sitting Lower Body Bathing: Minimal assistance Where Assessed - Lower Body Bathing: Lean right and/or left Upper Body Dressing: Set up;Supervision/safety Where Assessed - Upper Body Dressing: Supported sitting Lower Body Dressing: Minimal assistance Where Assessed - Lower Body Dressing: Lean right and/or left Toilet Transfer: Minimal assistance Toilet Transfer Method: Anterior-posterior Toilet Transfer Equipment: Other (comment) (from bed to chair) Tub/Shower Transfer Method: Not assessed Equipment Used: Gait belt Transfers/Ambulation Related to ADLs: Min  A ADL Comments: Educated on sensations pt may experience. Educated to be tapping/rubbing residual limbs. Pt practiced donning shorts and discussed he could either lean side to side or roll in bed to get them over bottom. Pt asking about bathing-OT explained tub transfer technique and use of tub bench. Gave pt theraband and showed him some exercises to be doing for UE strength.  Cognition: Cognition Overall Cognitive Status: Within Functional Limits for tasks assessed Orientation Level: Oriented X4 Cognition Arousal/Alertness: Awake/alert Behavior During Therapy: WFL for tasks assessed/performed Overall Cognitive Status: Within Functional Limits for tasks assessed  Blood pressure 138/67, pulse 82, temperature 99.9 F (37.7 C), temperature source Oral, resp. rate 18, height $RemoveBe'6\' 2"'[id number]$  (1.88 m), weight 75.2 kg (165 lb 12.6 oz), SpO2 97.00%. Physical Exam  Vitals reviewed. Constitutional: He is oriented to person, place, and time.  HENT:  Head: Normocephalic.  Eyes: EOM are normal.  Neck: Normal range of motion. Neck supple. No thyromegaly present.  Cardiovascular: Normal rate and regular rhythm.   Respiratory: Effort normal and breath sounds normal. No respiratory distress.  GI: Soft. Bowel sounds are normal. He exhibits no distension.  Neurological: He is alert and oriented to person, place, and time. No cranial nerve deficit. Coordination normal.  Patient follows full commands. Deltoids 4/5. Biceps, triceps, and hands 4+ to 5/5.   Skin:  Bilateral BKA sites dressed with immediate post-op dressing  Psychiatric: He has a normal mood and affect. His behavior is normal. Thought content normal.    Results for orders placed during the hospital encounter of 07/30/13 (from the past 24 hour(s))  GLUCOSE, CAPILLARY     Status: Abnormal   Collection Time    08/01/13  6:23 AM      Result Value Ref Range   Glucose-Capillary 160 (*) 70 - 99 mg/dL   Comment 1 Notify RN    GLUCOSE, CAPILLARY      Status: Abnormal   Collection Time    08/01/13 11:27 AM      Result Value Ref Range   Glucose-Capillary 130 (*) 70 - 99 mg/dL  GLUCOSE, CAPILLARY     Status: Abnormal   Collection Time    08/01/13  4:20 PM      Result Value Ref Range   Glucose-Capillary 117 (*) 70 - 99 mg/dL  GLUCOSE, CAPILLARY     Status: Abnormal   Collection  Time    08/01/13 10:00 PM      Result Value Ref Range   Glucose-Capillary 178 (*) 70 - 99 mg/dL   Comment 1 Documented in Chart     Comment 2 Notify RN     No results found.  Assessment/Plan: Diagnosis: s/p bilateral BKA 1. Does the need for close, 24 hr/day medical supervision in concert with the patient's rehab needs make it unreasonable for this patient to be served in a less intensive setting? Yes 2. Co-Morbidities requiring supervision/potential complications: dm, esrd, gerd 3. Due to bladder management, bowel management, safety, skin/wound care, disease management, medication administration, pain management and patient education, does the patient require 24 hr/day rehab nursing? Yes 4. Does the patient require coordinated care of a physician, rehab nurse, PT (1-2 hrs/day, 5 days/week) and OT (1-2 hrs/day, 5 days/week) to address physical and functional deficits in the context of the above medical diagnosis(es)? Yes Addressing deficits in the following areas: balance, endurance, locomotion, strength, transferring, bowel/bladder control, bathing, dressing, feeding, grooming, toileting and psychosocial support 5. Can the patient actively participate in an intensive therapy program of at least 3 hrs of therapy per day at least 5 days per week? Yes 6. The potential for patient to make measurable gains while on inpatient rehab is excellent 7. Anticipated functional outcomes upon discharge from inpatient rehab are mod I with PT, mod I with OT, n/a  with SLP. 8. Estimated rehab length of stay to reach the above functional goals is: 8-10 days 9. Does the patient  have adequate social supports to accommodate these discharge functional goals? Yes 10. Anticipated D/C setting: Home 11. Anticipated post D/C treatments: [facility] therapy 12. Overall Rehab/Functional Prognosis: excellent  RECOMMENDATIONS: This patient's condition is appropriate for continued rehabilitative care in the following setting: CIR Patient has agreed to participate in recommended program. Yes Note that insurance prior authorization may be required for reimbursement for recommended care.  Comment: Rehab Admissions Coordinator to follow up.  Thanks,  Meredith Staggers, MD, Mellody Drown     08/02/2013

## 2013-08-03 ENCOUNTER — Inpatient Hospital Stay (HOSPITAL_COMMUNITY)
Admission: RE | Admit: 2013-08-03 | Discharge: 2013-08-14 | DRG: 945 | Disposition: A | Discharge status: Home Health | Payer: Managed Care, Other (non HMO) | Source: Intra-hospital | Attending: Physical Medicine & Rehabilitation | Admitting: Physical Medicine & Rehabilitation

## 2013-08-03 ENCOUNTER — Encounter (HOSPITAL_COMMUNITY): Payer: Self-pay | Admitting: Orthopedic Surgery

## 2013-08-03 DIAGNOSIS — F329 Major depressive disorder, single episode, unspecified: Secondary | ICD-10-CM

## 2013-08-03 DIAGNOSIS — L98499 Non-pressure chronic ulcer of skin of other sites with unspecified severity: Secondary | ICD-10-CM

## 2013-08-03 DIAGNOSIS — S88119A Complete traumatic amputation at level between knee and ankle, unspecified lower leg, initial encounter: Secondary | ICD-10-CM

## 2013-08-03 DIAGNOSIS — Z79899 Other long term (current) drug therapy: Secondary | ICD-10-CM

## 2013-08-03 DIAGNOSIS — I4891 Unspecified atrial fibrillation: Secondary | ICD-10-CM

## 2013-08-03 DIAGNOSIS — E1149 Type 2 diabetes mellitus with other diabetic neurological complication: Secondary | ICD-10-CM

## 2013-08-03 DIAGNOSIS — Z7982 Long term (current) use of aspirin: Secondary | ICD-10-CM

## 2013-08-03 DIAGNOSIS — Z8509 Personal history of malignant neoplasm of other digestive organs: Secondary | ICD-10-CM

## 2013-08-03 DIAGNOSIS — Z992 Dependence on renal dialysis: Secondary | ICD-10-CM

## 2013-08-03 DIAGNOSIS — K219 Gastro-esophageal reflux disease without esophagitis: Secondary | ICD-10-CM

## 2013-08-03 DIAGNOSIS — M948X9 Other specified disorders of cartilage, unspecified sites: Secondary | ICD-10-CM

## 2013-08-03 DIAGNOSIS — E785 Hyperlipidemia, unspecified: Secondary | ICD-10-CM

## 2013-08-03 DIAGNOSIS — F3289 Other specified depressive episodes: Secondary | ICD-10-CM

## 2013-08-03 DIAGNOSIS — D62 Acute posthemorrhagic anemia: Secondary | ICD-10-CM

## 2013-08-03 DIAGNOSIS — Z7901 Long term (current) use of anticoagulants: Secondary | ICD-10-CM

## 2013-08-03 DIAGNOSIS — E1142 Type 2 diabetes mellitus with diabetic polyneuropathy: Secondary | ICD-10-CM

## 2013-08-03 DIAGNOSIS — Z794 Long term (current) use of insulin: Secondary | ICD-10-CM

## 2013-08-03 DIAGNOSIS — I951 Orthostatic hypotension: Secondary | ICD-10-CM

## 2013-08-03 DIAGNOSIS — E1129 Type 2 diabetes mellitus with other diabetic kidney complication: Secondary | ICD-10-CM

## 2013-08-03 DIAGNOSIS — E1159 Type 2 diabetes mellitus with other circulatory complications: Secondary | ICD-10-CM

## 2013-08-03 DIAGNOSIS — N186 End stage renal disease: Secondary | ICD-10-CM

## 2013-08-03 DIAGNOSIS — D649 Anemia, unspecified: Secondary | ICD-10-CM

## 2013-08-03 DIAGNOSIS — Z5189 Encounter for other specified aftercare: Principal | ICD-10-CM

## 2013-08-03 DIAGNOSIS — I96 Gangrene, not elsewhere classified: Secondary | ICD-10-CM

## 2013-08-03 DIAGNOSIS — Z89511 Acquired absence of right leg below knee: Secondary | ICD-10-CM

## 2013-08-03 DIAGNOSIS — I739 Peripheral vascular disease, unspecified: Secondary | ICD-10-CM

## 2013-08-03 DIAGNOSIS — Z89512 Acquired absence of left leg below knee: Secondary | ICD-10-CM

## 2013-08-03 DIAGNOSIS — D72829 Elevated white blood cell count, unspecified: Secondary | ICD-10-CM

## 2013-08-03 DIAGNOSIS — R5381 Other malaise: Secondary | ICD-10-CM

## 2013-08-03 DIAGNOSIS — E119 Type 2 diabetes mellitus without complications: Secondary | ICD-10-CM

## 2013-08-03 DIAGNOSIS — I120 Hypertensive chronic kidney disease with stage 5 chronic kidney disease or end stage renal disease: Secondary | ICD-10-CM

## 2013-08-03 LAB — RENAL FUNCTION PANEL
Albumin: 1.4 g/dL — ABNORMAL LOW (ref 3.5–5.2)
BUN: 58 mg/dL — ABNORMAL HIGH (ref 6–23)
CO2: 25 mEq/L (ref 19–32)
Calcium: 8 mg/dL — ABNORMAL LOW (ref 8.4–10.5)
Chloride: 95 mEq/L — ABNORMAL LOW (ref 96–112)
Creatinine, Ser: 6.25 mg/dL — ABNORMAL HIGH (ref 0.50–1.35)
GFR, EST AFRICAN AMERICAN: 10 mL/min — AB (ref >90)
GFR, EST NON AFRICAN AMERICAN: 9 mL/min — AB (ref >90)
GLUCOSE: 103 mg/dL — AB (ref 70–99)
PHOSPHORUS: 4.2 mg/dL (ref 2.3–4.6)
POTASSIUM: 4.7 meq/L (ref 3.7–5.3)
SODIUM: 137 meq/L (ref 137–147)

## 2013-08-03 LAB — CBC
HCT: 23.4 % — ABNORMAL LOW (ref 39.0–52.0)
HEMOGLOBIN: 7.1 g/dL — AB (ref 13.0–17.0)
MCH: 27.3 pg (ref 26.0–34.0)
MCHC: 30.3 g/dL (ref 30.0–36.0)
MCV: 90 fL (ref 78.0–100.0)
PLATELETS: 374 10*3/uL (ref 150–400)
RBC: 2.6 MIL/uL — AB (ref 4.22–5.81)
RDW: 17.5 % — ABNORMAL HIGH (ref 11.5–15.5)
WBC: 8.7 10*3/uL (ref 4.0–10.5)

## 2013-08-03 LAB — GLUCOSE, CAPILLARY
GLUCOSE-CAPILLARY: 130 mg/dL — AB (ref 70–99)
GLUCOSE-CAPILLARY: 121 mg/dL — AB (ref 70–99)
GLUCOSE-CAPILLARY: 166 mg/dL — AB (ref 70–99)
Glucose-Capillary: 71 mg/dL (ref 70–99)

## 2013-08-03 MED ORDER — PANTOPRAZOLE SODIUM 40 MG PO TBEC
40.0000 mg | DELAYED_RELEASE_TABLET | Freq: Every day | ORAL | Status: DC
  Administered 2013-08-04 – 2013-08-14 (×11): 40 mg via ORAL
  Filled 2013-08-03 (×12): qty 1

## 2013-08-03 MED ORDER — ONDANSETRON HCL 4 MG PO TABS: 4.0000 mg | ORAL_TABLET | Freq: Four times a day (QID) | ORAL | Status: DC | PRN

## 2013-08-03 MED ORDER — ASPIRIN EC 325 MG PO TBEC
325.0000 mg | DELAYED_RELEASE_TABLET | Freq: Every day | ORAL | Status: DC
  Administered 2013-08-04 – 2013-08-14 (×11): 325 mg via ORAL
  Filled 2013-08-03 (×12): qty 1

## 2013-08-03 MED ORDER — ATORVASTATIN CALCIUM 10 MG PO TABS
10.0000 mg | ORAL_TABLET | Freq: Every day | ORAL | Status: DC
  Administered 2013-08-03 – 2013-08-13 (×10): 10 mg via ORAL
  Filled 2013-08-03 (×12): qty 1

## 2013-08-03 MED ORDER — OXYCODONE-ACETAMINOPHEN 5-325 MG PO TABS
1.0000 | ORAL_TABLET | ORAL | Status: DC | PRN
  Administered 2013-08-03 – 2013-08-10 (×7): 2 via ORAL
  Filled 2013-08-03 (×7): qty 2

## 2013-08-03 MED ORDER — INSULIN GLARGINE 100 UNIT/ML ~~LOC~~ SOLN
10.0000 [IU] | Freq: Every day | SUBCUTANEOUS | Status: DC
  Administered 2013-08-03 – 2013-08-13 (×10): 10 [IU] via SUBCUTANEOUS
  Filled 2013-08-03 (×13): qty 0.1

## 2013-08-03 MED ORDER — RENA-VITE PO TABS
1.0000 | ORAL_TABLET | Freq: Every day | ORAL | Status: DC
  Administered 2013-08-03 – 2013-08-04 (×2): 1 via ORAL
  Administered 2013-08-05: 22:00:00 via ORAL
  Administered 2013-08-06 – 2013-08-13 (×8): 1 via ORAL
  Filled 2013-08-03 (×13): qty 1

## 2013-08-03 MED ORDER — SERTRALINE HCL 50 MG PO TABS
50.0000 mg | ORAL_TABLET | Freq: Every day | ORAL | Status: DC
  Administered 2013-08-04 – 2013-08-09 (×6): 50 mg via ORAL
  Administered 2013-08-10: 20:00:00 via ORAL
  Administered 2013-08-11 – 2013-08-14 (×4): 50 mg via ORAL
  Filled 2013-08-03 (×12): qty 1

## 2013-08-03 MED ORDER — METHOCARBAMOL 500 MG PO TABS: 500.0000 mg | ORAL_TABLET | Freq: Four times a day (QID) | ORAL | Status: DC | PRN

## 2013-08-03 MED ORDER — NA FERRIC GLUC CPLX IN SUCROSE 12.5 MG/ML IV SOLN
125.0000 mg | INTRAVENOUS | Status: DC
  Administered 2013-08-07 – 2013-08-14 (×4): 125 mg via INTRAVENOUS
  Filled 2013-08-03 (×12): qty 10

## 2013-08-03 MED ORDER — SODIUM CHLORIDE 0.9 % IV SOLN
INTRAVENOUS | Status: DC
  Filled 2013-08-03: qty 100

## 2013-08-03 MED ORDER — BOOST / RESOURCE BREEZE PO LIQD: 1.0000 | Freq: Three times a day (TID) | ORAL | Status: DC

## 2013-08-03 MED ORDER — INSULIN ASPART 100 UNIT/ML ~~LOC~~ SOLN
0.0000 [IU] | Freq: Three times a day (TID) | SUBCUTANEOUS | Status: DC
  Administered 2013-08-04: 2 [IU] via SUBCUTANEOUS
  Administered 2013-08-04: 3 [IU] via SUBCUTANEOUS
  Administered 2013-08-05 (×3): 2 [IU] via SUBCUTANEOUS
  Administered 2013-08-06: 5 [IU] via SUBCUTANEOUS
  Administered 2013-08-06 (×2): 3 [IU] via SUBCUTANEOUS
  Administered 2013-08-07 (×2): 2 [IU] via SUBCUTANEOUS
  Administered 2013-08-08: 5 [IU] via SUBCUTANEOUS
  Administered 2013-08-09: 8 [IU] via SUBCUTANEOUS
  Administered 2013-08-09 – 2013-08-10 (×2): 2 [IU] via SUBCUTANEOUS
  Administered 2013-08-11: 3 [IU] via SUBCUTANEOUS
  Administered 2013-08-12: 5 [IU] via SUBCUTANEOUS
  Administered 2013-08-13: 3 [IU] via SUBCUTANEOUS
  Administered 2013-08-13: 5 [IU] via SUBCUTANEOUS

## 2013-08-03 MED ORDER — GABAPENTIN 300 MG PO CAPS
300.0000 mg | ORAL_CAPSULE | Freq: Three times a day (TID) | ORAL | Status: DC
  Administered 2013-08-03 – 2013-08-14 (×32): 300 mg via ORAL
  Filled 2013-08-03 (×36): qty 1

## 2013-08-03 MED ORDER — INSULIN ASPART 100 UNIT/ML ~~LOC~~ SOLN
3.0000 [IU] | Freq: Three times a day (TID) | SUBCUTANEOUS | Status: DC
  Administered 2013-08-04 – 2013-08-13 (×21): 3 [IU] via SUBCUTANEOUS

## 2013-08-03 MED ORDER — SORBITOL 70 % SOLN: 30.0000 mL | Freq: Every day | Status: DC | PRN

## 2013-08-03 MED ORDER — HEPARIN SODIUM (PORCINE) 1000 UNIT/ML DIALYSIS
20.0000 [IU]/kg | INTRAMUSCULAR | Status: DC | PRN
  Administered 2013-08-03: 1500 [IU] via INTRAVENOUS_CENTRAL
  Filled 2013-08-03: qty 2

## 2013-08-03 MED ORDER — SEVELAMER CARBONATE 800 MG PO TABS
1600.0000 mg | ORAL_TABLET | Freq: Three times a day (TID) | ORAL | Status: DC
  Administered 2013-08-03 – 2013-08-09 (×17): 1600 mg via ORAL
  Filled 2013-08-03 (×21): qty 2

## 2013-08-03 MED ORDER — ONDANSETRON HCL 4 MG/2ML IJ SOLN: 4.0000 mg | Freq: Four times a day (QID) | INTRAMUSCULAR | Status: DC | PRN

## 2013-08-03 MED ORDER — DARBEPOETIN ALFA-POLYSORBATE 150 MCG/0.3ML IJ SOLN
150.0000 ug | INTRAMUSCULAR | Status: DC
  Administered 2013-08-10: 150 ug via INTRAVENOUS
  Filled 2013-08-03: qty 0.3

## 2013-08-03 MED ORDER — SODIUM THIOSULFATE 25 % IV SOLN
25.0000 g | Freq: Once | INTRAVENOUS | Status: AC
  Administered 2013-08-03: 25 g via INTRAVENOUS
  Filled 2013-08-03: qty 100

## 2013-08-03 MED ORDER — DARBEPOETIN ALFA-POLYSORBATE 150 MCG/0.3ML IJ SOLN
INTRAMUSCULAR | Status: AC
  Administered 2013-08-03: 150 ug via INTRAVENOUS
  Filled 2013-08-03: qty 0.3

## 2013-08-03 NOTE — H&P (View-Only) (Signed)
Physical Medicine and Rehabilitation Admission H&P    No chief complaint on file. :  Chief complaint: Leg pain  HPI: Paul [name] is a 58 y.o. right-handed male with history of end-stage renal disease on hemodialysis, insulin-dependent diabetes mellitus with peripheral neuropathy, history of atrial fibrillation with Coumadin discontinued secondary to Coumadin necrosis. Patient received inpatient rehabilitation services January 2015 for multifactorial gait disorder related to diabetes mellitus with peripheral neuropathy/calciphylaxis. He was discharged to home ambulating modified independence using a rolling walker. Admitted 07/30/2013 with extensive gangrene bilateral lower extremities. Patient had been followed at the wound Center. Limb not felt to be salvageable. I know a bilateral lower extremity below-knee amputation 07/30/2013 per Dr. Sharol Given. Postoperative pain management. Hemodialysis ongoing as per renal services. Acute on chronic anemia with latest hemoglobin 8.2. Physical and occupational therapy evaluations completed with recommendations of physical medicine rehabilitation consult. Patient was admitted for comprehensive rehabilitation program   ROS Review of Systems  Cardiovascular: Positive for palpitations and leg swelling.  Gastrointestinal:  GERD  Psychiatric/Behavioral: Positive for depression.  All other systems reviewed and are negative  Past Medical History  Diagnosis Date  . Open wound of both legs with complication     Pt has had progressive wounds of both LE's including gangrene of the toes and patchy necrosis of the calves.  He has been treated at [facility] by Dr Jerline Pain with hyperbaric O2 5d per week.  He is getting Na Thiosulfate with HD for suspected calciphylaxis. Pt says doctor's aren't sure if these ulcers were diabetic ulcers or calciphylaxis.  He underwent bilat BKA on 07/30/13.  He says that the woun  . ESRD on hemodialysis     Pt has ESRD due to DM.  He had  a L upper arm AVF prior to starting HD which was ligated due to L arm steal syndrome.  He started HD in Jan 2014 and did home HD.  The family couldn't cannulate the R arm AVF successfully so by mid 2014 they decided to switch to PD which was done late summer 2014.  In Jan 2015 he was admitted with FTT felt to be due to underdialysis on PD and PD was abandoned and he   . Anemia     esrd  . Hypertension     off of meds due to orthostatic hypotension  . Closed left arm fracture 1967  . Heart murmur   . Corneal abrasion, left   . GERD (gastroesophageal reflux disease)   . History of blood transfusion   . Glomerulosclerosis, diabetic   . Depression   . A-fib   . Calciphylaxis 05/2013    lower ext  . Diabetes mellitus     Type 1 iddm x 11 yrs  . Diabetic neuropathy   . Cancer     hx duodenal adenoCa 2012, resected   Past Surgical History  Procedure Laterality Date  . Whipple procedure  2002    duodenal ca, Dr. Harlow Asa  . Bone marrow biopsy  2012  . Renal biopsy, percutaneous  2012  . Tonsillectomy    . Portacath placement  2002  . Biliary diversion, external  2002  . Other surgical history      Cyst removed from back  . Av fistula placement  06/07/2011    Procedure: ARTERIOVENOUS (AV) FISTULA CREATION;  Surgeon: Angelia Mould, MD;  Location: [place];  Service: Vascular;  Laterality: Left;  . Ligation of arteriovenous  fistula  05/22/2012    Procedure:  LIGATION OF ARTERIOVENOUS  FISTULA;  Surgeon: Mal Misty, MD;  Location: [place];  Service: Vascular;  Laterality: Left;  Left brachio-cephalic fistula  . Insertion of dialysis catheter  05/22/2012    Procedure: INSERTION OF DIALYSIS CATHETER;  Surgeon: Mal Misty, MD;  Location: [place];  Service: Vascular;  Laterality: N/A;  right internal jugular vein  . Av fistula placement  06/18/2012    Procedure: ARTERIOVENOUS (AV) FISTULA CREATION;  Surgeon: Mal Misty, MD;  Location: [place];  Service: Vascular;  Laterality: Right;  H. Rivera Colon    . Colonoscopy      Hx: of  . Capd insertion N/A 01/14/2013    Procedure: LAPAROSCOPIC INSERTION CONTINUOUS AMBULATORY PERITONEAL DIALYSIS  (CAPD) CATHETER;  Surgeon: Adin Hector, MD;  Location: [place];  Service: General;  Laterality: N/A;  . Vascular surgery    . Removal of a dialysis catheter    . Capd removal N/A 07/09/2013    Procedure: CONTINUOUS AMBULATORY PERITONEAL DIALYSIS  (CAPD) CATHETER REMOVAL;  Surgeon: Adin Hector, MD;  Location: [place];  Service: General;  Laterality: N/A;   Family History  Problem Relation Age of Onset  . Cancer Mother   . Cancer Father     prostate cancer  . Heart disease Maternal Grandfather   . Stroke Paternal Grandmother    Social History:  reports that he has never smoked. He has never used smokeless tobacco. He reports that he does not drink alcohol or use illicit drugs. Allergies:  Allergies  Allergen Reactions  . Metformin And Related Other (See Comments)    Kidney problems   Medications Prior to Admission  Medication Sig Dispense Refill  . aspirin EC 81 MG tablet Take 81 mg by mouth daily.      [name] atorvastatin (LIPITOR) 10 MG tablet Take 10 mg by mouth daily.      . collagenase (SANTYL) ointment Apply 1 application topically daily.      [name] doxycycline (VIBRA-TABS) 100 MG tablet Take 100 mg by mouth daily. For 6 days.  Started 07/28/2013      . gabapentin (NEURONTIN) 300 MG capsule Take 300 mg by mouth at bedtime.      . Insulin Glargine (LANTUS SOLOSTAR) 100 UNIT/ML Solostar Pen Inject 5-10 Units into the skin 2 (two) times daily. Inject 5 units every morning and 10 units every night      . multivitamin (RENA-VIT) TABS tablet Take 1 tablet by mouth at bedtime.      . nitroGLYCERIN (NITRODUR - DOSED IN MG/24 HR) 0.1 mg/hr patch Place 0.2 mg onto the skin daily. One patch on each foot. Leave on for 12 hours, then remove for 12 hours      . omeprazole (PRILOSEC) 40 MG capsule Take 40 mg by mouth daily.      [name] oxyCODONE-acetaminophen  (PERCOCET) 10-325 MG per tablet Take 1 tablet by mouth 2 (two) times daily as needed for pain.      [name] sertraline (ZOLOFT) 50 MG tablet Take 50 mg by mouth daily.      . sevelamer carbonate (RENVELA) 800 MG tablet Take 1,600 mg by mouth 3 (three) times daily with meals.      . triamcinolone cream (KENALOG) 0.1 % Apply 1 application topically daily as needed (pain).         Home: Home Living Family/patient expects to be discharged to:: Inpatient rehab Living Arrangements: Spouse/significant other Available Help at Discharge: Family Type of Home: House Home Access: Stairs to  enter Entrance Stairs-Number of Steps: 4-5 Entrance Stairs-Rails: Right;Left Home Layout: Multi-level;Other (Comment) (W/c not able to fit in first level bed/bath) Alternate Level Stairs-Number of Steps: 1 step to get into living room Alternate Level Stairs-Rails: None Home Equipment: Bedside commode;Walker - 2 wheels;Cane - single point;Crutches;Shower seat Additional Comments: Downstairs bathroom with double doors, very wide space with exception of area around toilet. May not be able to enter bedroom on first floor with W/c    Functional History: Prior Function Comments: Needed assist with climbing stairs  Functional Status:  Mobility:min to mod assist for basic transfers and bed mobility          ADL: ADL Eating/Feeding: Independent Where Assessed - Eating/Feeding: Chair Grooming: Set up Where Assessed - Grooming: Supported sitting Upper Body Bathing: Set up;Supervision/safety Where Assessed - Upper Body Bathing: Supported sitting Lower Body Bathing: Minimal assistance Where Assessed - Lower Body Bathing: Lean right and/or left Upper Body Dressing: Set up;Supervision/safety Where Assessed - Upper Body Dressing: Supported sitting Lower Body Dressing: Minimal assistance Where Assessed - Lower Body Dressing: Lean right and/or left Toilet Transfer: Minimal assistance Toilet Transfer Method:  Anterior-posterior Toilet Transfer Equipment: Other (comment) (from bed to chair) Tub/Shower Transfer Method: Not assessed Equipment Used: Gait belt Transfers/Ambulation Related to ADLs: Min A ADL Comments: Educated on sensations pt may experience. Educated to be tapping/rubbing residual limbs. Pt practiced donning shorts and discussed he could either lean side to side or roll in bed to get them over bottom. Pt asking about bathing-OT explained tub transfer technique and use of tub bench. Gave pt theraband and showed him some exercises to be doing for UE strength.  Cognition: Cognition Overall Cognitive Status: Within Functional Limits for tasks assessed Orientation Level: Oriented X4 Cognition Arousal/Alertness: Awake/alert Behavior During Therapy: WFL for tasks assessed/performed Overall Cognitive Status: Within Functional Limits for tasks assessed  Physical Exam: Blood pressure 136/66, pulse 84, temperature 100.2 F (37.9 C), temperature source Oral, resp. rate 18, height _0  (1.88 m), weight 75.2 kg (165 lb 12.6 oz), SpO2 97.00%. Physical Exam Constitutional: He is oriented to person, place, and time. Hair cut, clean  HENT: oral mucosa pink and moist. Head: Normocephalic.  Eyes: EOM are normal.  Neck: Normal range of motion. Neck supple. No thyromegaly present.  Cardiovascular: Normal rate and regular rhythm. No murmurs, rubs, or gallops Respiratory: Effort normal and breath sounds normal. No respiratory distress. No wheezes, rales, or rhonchi GI: Soft. Bowel sounds are normal. He exhibits no distension.  Neurological: He is alert and oriented to person, place, and time. No cranial nerve deficit. Coordination normal.  Patient follows full commands. Deltoids 4/5. Biceps, triceps, and hands 4+ to 5/5. LE's HF 3/5, KE/KF limited due to wraps. Skin:  Bilateral BKA sites dressed with immediate post-op coban dressing  Psychiatric: He has a normal mood and affect. His behavior is  normal. Thought content normal  Results for orders placed during the hospital encounter of 07/30/13 (from the past 48 hour(s))  GLUCOSE, CAPILLARY     Status: Abnormal   Collection Time    08/01/13  6:23 AM      Result Value Ref Range   Glucose-Capillary 160 (*) 70 - 99 mg/dL   Comment 1 Notify RN    GLUCOSE, CAPILLARY     Status: Abnormal   Collection Time    08/01/13 11:27 AM      Result Value Ref Range   Glucose-Capillary 130 (*) 70 - 99 mg/dL  GLUCOSE, CAPILLARY  Status: Abnormal   Collection Time    08/01/13  4:20 PM      Result Value Ref Range   Glucose-Capillary 117 (*) 70 - 99 mg/dL  GLUCOSE, CAPILLARY     Status: Abnormal   Collection Time    08/01/13 10:00 PM      Result Value Ref Range   Glucose-Capillary 178 (*) 70 - 99 mg/dL   Comment 1 Documented in Chart     Comment 2 Notify RN    IRON AND TIBC     Status: Abnormal   Collection Time    08/02/13  5:14 AM      Result Value Ref Range   Iron 11 (*) 42 - 135 ug/dL   TIBC 73 (*) 215 - 435 ug/dL   Saturation Ratios 15 (*) 20 - 55 %   UIBC 62 (*) 125 - 400 ug/dL   Comment: Performed at [facility]  CBC     Status: Abnormal   Collection Time    08/02/13  5:14 AM      Result Value Ref Range   WBC 14.9 (*) 4.0 - 10.5 K/uL   RBC 3.00 (*) 4.22 - 5.81 MIL/uL   Hemoglobin 8.2 (*) 13.0 - 17.0 g/dL   HCT 27.5 (*) 39.0 - 52.0 %   MCV 91.7  78.0 - 100.0 fL   MCH 27.3  26.0 - 34.0 pg   MCHC 29.8 (*) 30.0 - 36.0 g/dL   RDW 17.5 (*) 11.5 - 15.5 %   Platelets 424 (*) 150 - 400 K/uL  GLUCOSE, CAPILLARY     Status: Abnormal   Collection Time    08/02/13  6:48 AM      Result Value Ref Range   Glucose-Capillary 128 (*) 70 - 99 mg/dL  GLUCOSE, CAPILLARY     Status: Abnormal   Collection Time    08/02/13 11:40 AM      Result Value Ref Range   Glucose-Capillary 175 (*) 70 - 99 mg/dL  GLUCOSE, CAPILLARY     Status: Abnormal   Collection Time    08/02/13  4:30 PM      Result Value Ref Range   Glucose-Capillary  161 (*) 70 - 99 mg/dL  GLUCOSE, CAPILLARY     Status: Abnormal   Collection Time    08/02/13 10:27 PM      Result Value Ref Range   Glucose-Capillary 128 (*) 70 - 99 mg/dL   No results found.  Post Admission Physician Evaluation: 1. Functional deficits secondary  to bilateral BKA's. 2. Patient is admitted to receive collaborative, interdisciplinary care between the physiatrist, rehab nursing staff, and therapy team. 3. Patient's level of medical complexity and substantial therapy needs in context of that medical necessity cannot be provided at a lesser intensity of care such as a SNF. 4. Patient has experienced substantial functional loss from his/her baseline which was documented above under the "Functional History" and "Functional Status" headings.  Judging by the patient's diagnosis, physical exam, and functional history, the patient has potential for functional progress which will result in measurable gains while on inpatient rehab.  These gains will be of substantial and practical use upon discharge  in facilitating mobility and self-care at the household level. 5. Physiatrist will provide 24 hour management of medical needs as well as oversight of the therapy plan/treatment and provide guidance as appropriate regarding the interaction of the two. 6. 24 hour rehab nursing will assist with bladder management, bowel management, safety, skin/wound  care, disease management, medication administration, pain management and patient education  and help integrate therapy concepts, techniques,education, etc. 7. PT will assess and treat for/with: Lower extremity strength, range of motion, stamina, balance, functional mobility, safety, adaptive techniques and equipment, pre-pros ed, pain mgt, stump care, education.   Goals are: mod I at w/c level. 8. OT will assess and treat for/with: ADL's, functional mobility, safety, upper extremity strength, adaptive techniques and equipment, pain mgt, stump protection,  education.   Goals are: mod I at w/c level. 9. SLP will assess and treat for/with: n/a.  Goals are: n/a. 10. Case Management and Social Worker will assess and treat for psychological issues and discharge planning. 11. Team conference will be held weekly to assess progress toward goals and to determine barriers to discharge. 12. Patient will receive at least 3 hours of therapy per day at least 5 days per week. 13. ELOS: 8-10 days       14. Prognosis:  excellent   Medical Problem List and Plan: 1. Bilateral BKA secondary to extensive gangrene/calciphylaxis 07/30/2013 2. DVT Prophylaxis/Anticoagulation: Aspirin  3. Pain Management: Neurontin 300 mg 3 times a day, Percocet and Robaxin as needed. Monitor with increased mobility 4. Mood/depression. Zoloft 50 mg daily. Provide emotional support 5. Neuropsych: This patient is capable of making decisions on his own behalf. 6. End-stage renal disease. Continue hemodialysis as per renal services 7. Diabetes mellitus with peripheral neuropathy. Latest hemoglobin A1c 6.7. NovoLog 3 units 3 times a day, Lantus insulin 10 units each bedtime. Check CBGs a.c. and at bedtime 8. History of atrial fibrillation. No anticoagulations secondary to suspect Coumadin necrosis. Cardiac rate controlled 9. Acute on chronic anemia. Latest hemoglobin 8.2. Continue Aranesp 10. Hyperlipidemia. Lipitor 11. GERD. Protonix  Meredith Staggers, MD, [facility]  08/03/2013

## 2013-08-03 NOTE — H&P (Signed)
Physical Medicine and Rehabilitation Admission H&P    No chief complaint on file. :  Chief complaint: Leg pain  HPI: Paul [name] is a 58 y.o. right-handed male with history of end-stage renal disease on hemodialysis, insulin-dependent diabetes mellitus with peripheral neuropathy, history of atrial fibrillation with Coumadin discontinued secondary to Coumadin necrosis. Patient received inpatient rehabilitation services January 2015 for multifactorial gait disorder related to diabetes mellitus with peripheral neuropathy/calciphylaxis. He was discharged to home ambulating modified independence using a rolling walker. Admitted 07/30/2013 with extensive gangrene bilateral lower extremities. Patient had been followed at the wound Center. Limb not felt to be salvageable. I know a bilateral lower extremity below-knee amputation 07/30/2013 per Dr. Sharol Given. Postoperative pain management. Hemodialysis ongoing as per renal services. Acute on chronic anemia with latest hemoglobin 8.2. Physical and occupational therapy evaluations completed with recommendations of physical medicine rehabilitation consult. Patient was admitted for comprehensive rehabilitation program   ROS Review of Systems  Cardiovascular: Positive for palpitations and leg swelling.  Gastrointestinal:  GERD  Psychiatric/Behavioral: Positive for depression.  All other systems reviewed and are negative  Past Medical History  Diagnosis Date  . Open wound of both legs with complication     Pt has had progressive wounds of both LE's including gangrene of the toes and patchy necrosis of the calves.  He has been treated at [facility] by Dr Jerline Pain with hyperbaric O2 5d per week.  He is getting Na Thiosulfate with HD for suspected calciphylaxis. Pt says doctor's aren't sure if these ulcers were diabetic ulcers or calciphylaxis.  He underwent bilat BKA on 07/30/13.  He says that the woun  . ESRD on hemodialysis     Pt has ESRD due to DM.  He had  a L upper arm AVF prior to starting HD which was ligated due to L arm steal syndrome.  He started HD in Jan 2014 and did home HD.  The family couldn't cannulate the R arm AVF successfully so by mid 2014 they decided to switch to PD which was done late summer 2014.  In Jan 2015 he was admitted with FTT felt to be due to underdialysis on PD and PD was abandoned and he   . Anemia     esrd  . Hypertension     off of meds due to orthostatic hypotension  . Closed left arm fracture 1967  . Heart murmur   . Corneal abrasion, left   . GERD (gastroesophageal reflux disease)   . History of blood transfusion   . Glomerulosclerosis, diabetic   . Depression   . A-fib   . Calciphylaxis 05/2013    lower ext  . Diabetes mellitus     Type 1 iddm x 11 yrs  . Diabetic neuropathy   . Cancer     hx duodenal adenoCa 2012, resected   Past Surgical History  Procedure Laterality Date  . Whipple procedure  2002    duodenal ca, Dr. Harlow Asa  . Bone marrow biopsy  2012  . Renal biopsy, percutaneous  2012  . Tonsillectomy    . Portacath placement  2002  . Biliary diversion, external  2002  . Other surgical history      Cyst removed from back  . Av fistula placement  06/07/2011    Procedure: ARTERIOVENOUS (AV) FISTULA CREATION;  Surgeon: Angelia Mould, MD;  Location: [place];  Service: Vascular;  Laterality: Left;  . Ligation of arteriovenous  fistula  05/22/2012    Procedure:  LIGATION OF ARTERIOVENOUS  FISTULA;  Surgeon: Mal Misty, MD;  Location: [place];  Service: Vascular;  Laterality: Left;  Left brachio-cephalic fistula  . Insertion of dialysis catheter  05/22/2012    Procedure: INSERTION OF DIALYSIS CATHETER;  Surgeon: Mal Misty, MD;  Location: [place];  Service: Vascular;  Laterality: N/A;  right internal jugular vein  . Av fistula placement  06/18/2012    Procedure: ARTERIOVENOUS (AV) FISTULA CREATION;  Surgeon: Mal Misty, MD;  Location: [place];  Service: Vascular;  Laterality: Right;  H. Rivera Colon    . Colonoscopy      Hx: of  . Capd insertion N/A 01/14/2013    Procedure: LAPAROSCOPIC INSERTION CONTINUOUS AMBULATORY PERITONEAL DIALYSIS  (CAPD) CATHETER;  Surgeon: Adin Hector, MD;  Location: [place];  Service: General;  Laterality: N/A;  . Vascular surgery    . Removal of a dialysis catheter    . Capd removal N/A 07/09/2013    Procedure: CONTINUOUS AMBULATORY PERITONEAL DIALYSIS  (CAPD) CATHETER REMOVAL;  Surgeon: Adin Hector, MD;  Location: [place];  Service: General;  Laterality: N/A;   Family History  Problem Relation Age of Onset  . Cancer Mother   . Cancer Father     prostate cancer  . Heart disease Maternal Grandfather   . Stroke Paternal Grandmother    Social History:  reports that he has never smoked. He has never used smokeless tobacco. He reports that he does not drink alcohol or use illicit drugs. Allergies:  Allergies  Allergen Reactions  . Metformin And Related Other (See Comments)    Kidney problems   Medications Prior to Admission  Medication Sig Dispense Refill  . aspirin EC 81 MG tablet Take 81 mg by mouth daily.      [name] atorvastatin (LIPITOR) 10 MG tablet Take 10 mg by mouth daily.      . collagenase (SANTYL) ointment Apply 1 application topically daily.      [name] doxycycline (VIBRA-TABS) 100 MG tablet Take 100 mg by mouth daily. For 6 days.  Started 07/28/2013      . gabapentin (NEURONTIN) 300 MG capsule Take 300 mg by mouth at bedtime.      . Insulin Glargine (LANTUS SOLOSTAR) 100 UNIT/ML Solostar Pen Inject 5-10 Units into the skin 2 (two) times daily. Inject 5 units every morning and 10 units every night      . multivitamin (RENA-VIT) TABS tablet Take 1 tablet by mouth at bedtime.      . nitroGLYCERIN (NITRODUR - DOSED IN MG/24 HR) 0.1 mg/hr patch Place 0.2 mg onto the skin daily. One patch on each foot. Leave on for 12 hours, then remove for 12 hours      . omeprazole (PRILOSEC) 40 MG capsule Take 40 mg by mouth daily.      [name] oxyCODONE-acetaminophen  (PERCOCET) 10-325 MG per tablet Take 1 tablet by mouth 2 (two) times daily as needed for pain.      [name] sertraline (ZOLOFT) 50 MG tablet Take 50 mg by mouth daily.      . sevelamer carbonate (RENVELA) 800 MG tablet Take 1,600 mg by mouth 3 (three) times daily with meals.      . triamcinolone cream (KENALOG) 0.1 % Apply 1 application topically daily as needed (pain).         Home: Home Living Family/patient expects to be discharged to:: Inpatient rehab Living Arrangements: Spouse/significant other Available Help at Discharge: Family Type of Home: House Home Access: Stairs to  enter Entrance Stairs-Number of Steps: 4-5 Entrance Stairs-Rails: Right;Left Home Layout: Multi-level;Other (Comment) (W/c not able to fit in first level bed/bath) Alternate Level Stairs-Number of Steps: 1 step to get into living room Alternate Level Stairs-Rails: None Home Equipment: Bedside commode;Walker - 2 wheels;Cane - single point;Crutches;Shower seat Additional Comments: Downstairs bathroom with double doors, very wide space with exception of area around toilet. May not be able to enter bedroom on first floor with W/c    Functional History: Prior Function Comments: Needed assist with climbing stairs  Functional Status:  Mobility:min to mod assist for basic transfers and bed mobility          ADL: ADL Eating/Feeding: Independent Where Assessed - Eating/Feeding: Chair Grooming: Set up Where Assessed - Grooming: Supported sitting Upper Body Bathing: Set up;Supervision/safety Where Assessed - Upper Body Bathing: Supported sitting Lower Body Bathing: Minimal assistance Where Assessed - Lower Body Bathing: Lean right and/or left Upper Body Dressing: Set up;Supervision/safety Where Assessed - Upper Body Dressing: Supported sitting Lower Body Dressing: Minimal assistance Where Assessed - Lower Body Dressing: Lean right and/or left Toilet Transfer: Minimal assistance Toilet Transfer Method:  Anterior-posterior Toilet Transfer Equipment: Other (comment) (from bed to chair) Tub/Shower Transfer Method: Not assessed Equipment Used: Gait belt Transfers/Ambulation Related to ADLs: Min A ADL Comments: Educated on sensations pt may experience. Educated to be tapping/rubbing residual limbs. Pt practiced donning shorts and discussed he could either lean side to side or roll in bed to get them over bottom. Pt asking about bathing-OT explained tub transfer technique and use of tub bench. Gave pt theraband and showed him some exercises to be doing for UE strength.  Cognition: Cognition Overall Cognitive Status: Within Functional Limits for tasks assessed Orientation Level: Oriented X4 Cognition Arousal/Alertness: Awake/alert Behavior During Therapy: WFL for tasks assessed/performed Overall Cognitive Status: Within Functional Limits for tasks assessed  Physical Exam: Blood pressure 136/66, pulse 84, temperature 100.2 F (37.9 C), temperature source Oral, resp. rate 18, height _0  (1.88 m), weight 75.2 kg (165 lb 12.6 oz), SpO2 97.00%. Physical Exam Constitutional: He is oriented to person, place, and time. Hair cut, clean  HENT: oral mucosa pink and moist. Head: Normocephalic.  Eyes: EOM are normal.  Neck: Normal range of motion. Neck supple. No thyromegaly present.  Cardiovascular: Normal rate and regular rhythm. No murmurs, rubs, or gallops Respiratory: Effort normal and breath sounds normal. No respiratory distress. No wheezes, rales, or rhonchi GI: Soft. Bowel sounds are normal. He exhibits no distension.  Neurological: He is alert and oriented to person, place, and time. No cranial nerve deficit. Coordination normal.  Patient follows full commands. Deltoids 4/5. Biceps, triceps, and hands 4+ to 5/5. LE's HF 3/5, KE/KF limited due to wraps. Skin:  Bilateral BKA sites dressed with immediate post-op coban dressing  Psychiatric: He has a normal mood and affect. His behavior is  normal. Thought content normal  Results for orders placed during the hospital encounter of 07/30/13 (from the past 48 hour(s))  GLUCOSE, CAPILLARY     Status: Abnormal   Collection Time    08/01/13  6:23 AM      Result Value Ref Range   Glucose-Capillary 160 (*) 70 - 99 mg/dL   Comment 1 Notify RN    GLUCOSE, CAPILLARY     Status: Abnormal   Collection Time    08/01/13 11:27 AM      Result Value Ref Range   Glucose-Capillary 130 (*) 70 - 99 mg/dL  GLUCOSE, CAPILLARY  Status: Abnormal   Collection Time    08/01/13  4:20 PM      Result Value Ref Range   Glucose-Capillary 117 (*) 70 - 99 mg/dL  GLUCOSE, CAPILLARY     Status: Abnormal   Collection Time    08/01/13 10:00 PM      Result Value Ref Range   Glucose-Capillary 178 (*) 70 - 99 mg/dL   Comment 1 Documented in Chart     Comment 2 Notify RN    IRON AND TIBC     Status: Abnormal   Collection Time    08/02/13  5:14 AM      Result Value Ref Range   Iron 11 (*) 42 - 135 ug/dL   TIBC 73 (*) 215 - 435 ug/dL   Saturation Ratios 15 (*) 20 - 55 %   UIBC 62 (*) 125 - 400 ug/dL   Comment: Performed at [facility]  CBC     Status: Abnormal   Collection Time    08/02/13  5:14 AM      Result Value Ref Range   WBC 14.9 (*) 4.0 - 10.5 K/uL   RBC 3.00 (*) 4.22 - 5.81 MIL/uL   Hemoglobin 8.2 (*) 13.0 - 17.0 g/dL   HCT 27.5 (*) 39.0 - 52.0 %   MCV 91.7  78.0 - 100.0 fL   MCH 27.3  26.0 - 34.0 pg   MCHC 29.8 (*) 30.0 - 36.0 g/dL   RDW 17.5 (*) 11.5 - 15.5 %   Platelets 424 (*) 150 - 400 K/uL  GLUCOSE, CAPILLARY     Status: Abnormal   Collection Time    08/02/13  6:48 AM      Result Value Ref Range   Glucose-Capillary 128 (*) 70 - 99 mg/dL  GLUCOSE, CAPILLARY     Status: Abnormal   Collection Time    08/02/13 11:40 AM      Result Value Ref Range   Glucose-Capillary 175 (*) 70 - 99 mg/dL  GLUCOSE, CAPILLARY     Status: Abnormal   Collection Time    08/02/13  4:30 PM      Result Value Ref Range   Glucose-Capillary  161 (*) 70 - 99 mg/dL  GLUCOSE, CAPILLARY     Status: Abnormal   Collection Time    08/02/13 10:27 PM      Result Value Ref Range   Glucose-Capillary 128 (*) 70 - 99 mg/dL   No results found.  Post Admission Physician Evaluation: 1. Functional deficits secondary  to bilateral BKA's. 2. Patient is admitted to receive collaborative, interdisciplinary care between the physiatrist, rehab nursing staff, and therapy team. 3. Patient's level of medical complexity and substantial therapy needs in context of that medical necessity cannot be provided at a lesser intensity of care such as a SNF. 4. Patient has experienced substantial functional loss from his/her baseline which was documented above under the "Functional History" and "Functional Status" headings.  Judging by the patient's diagnosis, physical exam, and functional history, the patient has potential for functional progress which will result in measurable gains while on inpatient rehab.  These gains will be of substantial and practical use upon discharge  in facilitating mobility and self-care at the household level. 5. Physiatrist will provide 24 hour management of medical needs as well as oversight of the therapy plan/treatment and provide guidance as appropriate regarding the interaction of the two. 6. 24 hour rehab nursing will assist with bladder management, bowel management, safety, skin/wound  care, disease management, medication administration, pain management and patient education  and help integrate therapy concepts, techniques,education, etc. 7. PT will assess and treat for/with: Lower extremity strength, range of motion, stamina, balance, functional mobility, safety, adaptive techniques and equipment, pre-pros ed, pain mgt, stump care, education.   Goals are: mod I at w/c level. 8. OT will assess and treat for/with: ADL's, functional mobility, safety, upper extremity strength, adaptive techniques and equipment, pain mgt, stump protection,  education.   Goals are: mod I at w/c level. 9. SLP will assess and treat for/with: n/a.  Goals are: n/a. 10. Case Management and Social Worker will assess and treat for psychological issues and discharge planning. 11. Team conference will be held weekly to assess progress toward goals and to determine barriers to discharge. 12. Patient will receive at least 3 hours of therapy per day at least 5 days per week. 13. ELOS: 8-10 days       14. Prognosis:  excellent   Medical Problem List and Plan: 1. Bilateral BKA secondary to extensive gangrene/calciphylaxis 07/30/2013 2. DVT Prophylaxis/Anticoagulation: Aspirin  3. Pain Management: Neurontin 300 mg 3 times a day, Percocet and Robaxin as needed. Monitor with increased mobility 4. Mood/depression. Zoloft 50 mg daily. Provide emotional support 5. Neuropsych: This patient is capable of making decisions on his own behalf. 6. End-stage renal disease. Continue hemodialysis as per renal services 7. Diabetes mellitus with peripheral neuropathy. Latest hemoglobin A1c 6.7. NovoLog 3 units 3 times a day, Lantus insulin 10 units each bedtime. Check CBGs a.c. and at bedtime 8. History of atrial fibrillation. No anticoagulations secondary to suspect Coumadin necrosis. Cardiac rate controlled 9. Acute on chronic anemia. Latest hemoglobin 8.2. Continue Aranesp 10. Hyperlipidemia. Lipitor 11. GERD. Protonix  Meredith Staggers, MD, [facility]  08/03/2013

## 2013-08-03 NOTE — Progress Notes (Signed)
PT Cancellation Note  Patient Details Name: Paul [name] MRN: [id number] DOB: 1956/02/02   Cancelled Treatment:    Reason Eval/Treat Not Completed: Patient at procedure or test/unavailable (Dialysis) Pt not available this afternoon, per nurse he is in dialysis. Will follow up tomorrow if pt still on unit (may d/c to [place])  Elayne Snare, [place]   Ellouise Newer 08/03/2013, 2:29 PM

## 2013-08-03 NOTE — Procedures (Signed)
I was present at this dialysis session. I have reviewed the session itself and made appropriate changes.   Note plans for transition to CIR.  On sodium thiosulfate and IV Fe.  Check PTh to ensure < 300.  Pt feels well.  Ready to recover.    Pearson Grippe  MD 08/03/2013, 3:24 PM

## 2013-08-03 NOTE — Progress Notes (Signed)
Patient ID: Paul [name], male   DOB: 06-02-1955, 58 y.o.   MRN: [id number] Dressings clean dry and intact. Anticipate discharge to inpatient rehabilitation. Patient comfortable without complaints this morning.

## 2013-08-03 NOTE — Discharge Summary (Signed)
Physician Discharge Summary  Patient ID: Paul [name] MRN: [id number] DOB/AGE: 1956-02-17 58 y.o.  Admit date: 07/30/2013 Discharge date: 08/03/2013  Admission Diagnoses: Gangrene bilateral lower extremities  Discharge Diagnoses: Gangrene bilateral lower extremities Active Problems:   S/P BKA (below knee amputation) bilateral   Open wound of both legs with complication   Discharged Condition: stable  Hospital Course: Patient's hospital course was essentially unremarkable. He underwent bilateral transtibial amputation for gangrene of both lower extremities. Postoperatively patient crest well and was discharged to inpatient rehabilitation.  Consults: nephrology  Significant Diagnostic Studies: labs: Routine labs  Treatments: dialysis: Hemodialysis  Discharge Exam: Blood pressure 136/66, pulse 84, temperature 100.2 F (37.9 C), temperature source Oral, resp. rate 18, height 6\' 2"  (1.88 m), weight 75.2 kg (165 lb 12.6 oz), SpO2 97.00%. Incision/Wound: dressing clean dry and intact  Disposition: 01-Home or Self Care   Future Appointments Provider Department Dept Phone   08/16/2013 2:00 PM Liliane Shi, PA-C [place] Office [phone]       Medication List    ASK your doctor about these medications       aspirin EC 81 MG tablet  Take 81 mg by mouth daily.     atorvastatin 10 MG tablet  Commonly known as:  LIPITOR  Take 10 mg by mouth daily.     doxycycline 100 MG tablet  Commonly known as:  VIBRA-TABS  Take 100 mg by mouth daily. For 6 days.  Started 07/28/2013     gabapentin 300 MG capsule  Commonly known as:  NEURONTIN  Take 300 mg by mouth at bedtime.     LANTUS SOLOSTAR 100 UNIT/ML Solostar Pen  Generic drug:  Insulin Glargine  Inject 5-10 Units into the skin 2 (two) times daily. Inject 5 units every morning and 10 units every night     multivitamin Tabs tablet  Take 1 tablet by mouth at bedtime.     nitroGLYCERIN 0.1 mg/hr patch   Commonly known as:  NITRODUR - Dosed in mg/24 hr  - Place 0.2 mg onto the skin daily. One patch on each foot.  - Leave on for 12 hours, then remove for 12 hours     omeprazole 40 MG capsule  Commonly known as:  PRILOSEC  Take 40 mg by mouth daily.     oxyCODONE-acetaminophen 10-325 MG per tablet  Commonly known as:  PERCOCET  Take 1 tablet by mouth 2 (two) times daily as needed for pain.     SANTYL ointment  Generic drug:  collagenase  Apply 1 application topically daily.     sertraline 50 MG tablet  Commonly known as:  ZOLOFT  Take 50 mg by mouth daily.     sevelamer carbonate 800 MG tablet  Commonly known as:  RENVELA  Take 1,600 mg by mouth 3 (three) times daily with meals.     triamcinolone cream 0.1 %  Commonly known as:  KENALOG  Apply 1 application topically daily as needed (pain).           Follow-up Information   Follow up with DUDA,MARCUS V, MD In 2 weeks.   Specialty:  Orthopedic Surgery   Contact information:   [place] [id number] [phone]       Follow up with DUDA,MARCUS V, MD In 2 weeks.   Specialty:  Orthopedic Surgery   Contact information:   [place] [address] [phone]       Signed: Newt Minion  08/03/2013, 7:58 AM

## 2013-08-03 NOTE — Discharge Instructions (Signed)
Change dressings bilateral transtibial amputation as needed.

## 2013-08-03 NOTE — Progress Notes (Signed)
OT Cancellation Note  Patient Details Name: Paul [name] MRN: [id number] DOB: Jun 27, 1955   Cancelled Treatment:    Reason Eval/Treat Not Completed: Patient at procedure or test/ unavailable. Per RN, pt at dialysis this afternoon. Will follow-up tomorrow if pt is still on unit (may d/c to CIR this evening).   [name] [id number] 08/03/2013, 3:02 PM

## 2013-08-03 NOTE — Progress Notes (Signed)
Rehab admissions - I have approval for acute inpatient rehab admission from [facility].  Bed available and will admit to rehab today.  Patient and wife are in agreement.  Call me for questions.  [id number]

## 2013-08-03 NOTE — PMR Pre-admission (Signed)
PMR Admission Coordinator Pre-Admission Assessment  Patient: Paul [name] is an 58 y.o., male MRN: [id number] DOB: Nov 30, 1955 Height: $RemoveBefo'6\' 2"'TZXcWxtbgcd$  (188 cm) Weight: 76.2 kg (167 lb 15.9 oz)              Insurance Information HMO:      PPO:       PCP:       IPA:       80/20:       OTHER:  Group # [id number] PRIMARY: [employer] managed      Policy#: [id number]      Subscriber: Paul [name] CM Name: Paul [name]      Phone#:       Fax#: [phone] Pre-Cert#: [id number]      Employer: [employer] Benefits:  Phone #: [phone]     Name: Automated Eff. Date: 05/20/02     Deduct: $500(met all)      Out of Pocket Max:  $1500(met$516.61)      Life Max: unlimited CIR: 90%      SNF: 90%  120 days max Outpatient: 60 days max     Co-Pay:  $40/visit Home Health: 90%      Co-Pay: 10% DME: 90%     Co-Pay: 10% Providers: in network   Emergency [place]   Name Relation Home Work Mobile   [place] Spouse [phone]  [phone]     Current Medical History  Patient Admitting Diagnosis: B BKA   History of Present Illness: A 58 y.o. right-handed male with history of end-stage renal disease on hemodialysis, insulin-dependent diabetes mellitus with peripheral neuropathy, history of atrial fibrillation with Coumadin discontinued secondary to Coumadin necrosis. Patient received inpatient rehabilitation services January 2015 for multifactorial gait disorder related to diabetes mellitus with peripheral neuropathy/calciphylaxis. He was discharged to home ambulating modified independence using a rolling walker. Admitted 07/30/2013 with extensive gangrene bilateral lower extremities. Patient had been followed at the wound Center. Limb not felt to be salvageable. I know a bilateral lower extremity below-knee amputation 07/30/2013 per Dr. Sharol Given. Postoperative pain management. Hemodialysis ongoing as per renal services. Acute on chronic anemia with latest hemoglobin 8.2. Physical and occupational therapy  evaluations completed with recommendations of physical medicine rehabilitation consult. Patient will be admitted for comprehensive rehabilitation program.    Past Medical History  Past Medical History  Diagnosis Date  . Open wound of both legs with complication     Pt has had progressive wounds of both LE's including gangrene of the toes and patchy necrosis of the calves.  He has been treated at [facility] by Dr Jerline Pain with hyperbaric O2 5d per week.  He is getting Na Thiosulfate with HD for suspected calciphylaxis. Pt says doctor's aren't sure if these ulcers were diabetic ulcers or calciphylaxis.  He underwent bilat BKA on 07/30/13.  He says that the woun  . ESRD on hemodialysis     Pt has ESRD due to DM.  He had a L upper arm AVF prior to starting HD which was ligated due to L arm steal syndrome.  He started HD in Jan 2014 and did home HD.  The family couldn't cannulate the R arm AVF successfully so by mid 2014 they decided to switch to PD which was done late summer 2014.  In Jan 2015 he was admitted with FTT felt to be due to underdialysis on PD and PD was abandoned and he   . Anemia     esrd  . Hypertension     off  of meds due to orthostatic hypotension  . Closed left arm fracture 1967  . Heart murmur   . Corneal abrasion, left   . GERD (gastroesophageal reflux disease)   . History of blood transfusion   . Glomerulosclerosis, diabetic   . Depression   . A-fib   . Calciphylaxis 05/2013    lower ext  . Diabetes mellitus     Type 1 iddm x 11 yrs  . Diabetic neuropathy   . Cancer     hx duodenal adenoCa 2012, resected    Family History  family history includes Cancer in his father and mother; Heart disease in his maternal grandfather; Stroke in his paternal grandmother.  Prior Rehab/Hospitalizations:  Was on CIR 2 months ago 01/15 for 2 weeks.   Current Medications  Current facility-administered medications:0.9 %  sodium chloride infusion, , Intravenous, Continuous, Newt Minion, MD, 20 mL/hr at 07/30/13 1035;  aspirin EC tablet 325 mg, 325 mg, Oral, Daily, Newt Minion, MD, 325 mg at 08/03/13 1037;  atorvastatin (LIPITOR) tablet 10 mg, 10 mg, Oral, q1800, Newt Minion, MD, 10 mg at 08/02/13 1704;  darbepoetin (ARANESP) injection 150 mcg, 150 mcg, Intravenous, Q Tue-HD, Foye Clock, PA-C feeding supplement (RESOURCE BREEZE) (RESOURCE BREEZE) liquid 1 Container, 1 Container, Oral, TID BM, Foye Clock, PA-C, 1 Container at 08/03/13 1045;  ferric gluconate (NULECIT) 125 mg in sodium chloride 0.9 % 100 mL IVPB, 125 mg, Intravenous, Q T,Th,Sa-HD, Rexene Agent, MD;  gabapentin (NEURONTIN) capsule 300 mg, 300 mg, Oral, TID, Newt Minion, MD, 300 mg at 08/03/13 1037 heparin injection 1,500 Units, 20 Units/kg, Dialysis, PRN, Rexene Agent, MD;  HYDROmorphone (DILAUDID) injection 0.5-1 mg, 0.5-1 mg, Intravenous, Q2H PRN, Newt Minion, MD, 1 mg at 07/30/13 1551;  insulin aspart (novoLOG) injection 0-15 Units, 0-15 Units, Subcutaneous, TID WC, Newt Minion, MD, 2 Units at 08/03/13 0753;  insulin aspart (novoLOG) injection 3 Units, 3 Units, Subcutaneous, TID WC, Newt Minion, MD, 3 Units at 08/03/13 0754 insulin glargine (LANTUS) injection 10 Units, 10 Units, Subcutaneous, QHS, Newt Minion, MD, 10 Units at 08/02/13 2230;  methocarbamol (ROBAXIN) 500 mg in dextrose 5 % 50 mL IVPB, 500 mg, Intravenous, Q6H PRN, Newt Minion, MD;  methocarbamol (ROBAXIN) tablet 500 mg, 500 mg, Oral, Q6H PRN, Newt Minion, MD, 500 mg at 08/01/13 1611;  metoCLOPramide (REGLAN) injection 5-10 mg, 5-10 mg, Intravenous, Q8H PRN, Newt Minion, MD metoCLOPramide (REGLAN) tablet 5-10 mg, 5-10 mg, Oral, Q8H PRN, Newt Minion, MD;  multivitamin (RENA-VIT) tablet 1 tablet, 1 tablet, Oral, QHS, Newt Minion, MD, 1 tablet at 08/02/13 2229;  ondansetron [facility]) injection 4 mg, 4 mg, Intravenous, Q6H PRN, Newt Minion, MD, 4 mg at 08/02/13 1800;  ondansetron (ZOFRAN) tablet 4 mg, 4 mg, Oral, Q6H PRN,  Newt Minion, MD oxyCODONE-acetaminophen (PERCOCET/ROXICET) 5-325 MG per tablet 1-2 tablet, 1-2 tablet, Oral, Q4H PRN, Newt Minion, MD, 1 tablet at 08/03/13 1120;  pantoprazole (PROTONIX) EC tablet 40 mg, 40 mg, Oral, Daily, Newt Minion, MD, 40 mg at 08/03/13 1045;  sertraline (ZOLOFT) tablet 50 mg, 50 mg, Oral, Daily, Newt Minion, MD, 50 mg at 08/03/13 1037 sevelamer carbonate (RENVELA) tablet 1,600 mg, 1,600 mg, Oral, TID WC, Newt Minion, MD, 1,600 mg at 08/03/13 1117;  sodium thiosulfate 25 % injection 25 g, 25 g, Intravenous, Q T,Th,Sa-HD, Sol Blazing, MD, 25 g at 07/31/13 1302  Patients Current Diet:  Diabetic  Precautions / Restrictions Precautions Precautions: Fall Precaution Booklet Issued: No Precaution Comments: Reviewed precautions with pt Restrictions Weight Bearing Restrictions: Yes RLE Weight Bearing: Non weight bearing LLE Weight Bearing: Non weight bearing Other Position/Activity Restrictions: No pillow under knees   Prior Activity Level Went out daily.  Went to HD T-Th-Sat.  Home Assistive Devices / Equipment Home Equipment: Bedside commode;Walker - 2 wheels;Cane - single point;Crutches;Shower seat  Prior Functional Level Prior Function Level of Independence: Needs assistance Gait / Transfers Assistance Needed: uses a RW ADL's / Homemaking Assistance Needed: able to dress/bath self Comments: Needed assist with climbing stairs  Current Functional Level Cognition  Overall Cognitive Status: Within Functional Limits for tasks assessed Orientation Level: Oriented X4    Extremity Assessment (includes Sensation/Coordination)          ADLs  Eating/Feeding: Independent  Where Assessed - Eating/Feeding: Chair  Grooming: Set up  Where Assessed - Grooming: Supported sitting  Upper Body Bathing: Set up;Supervision/safety  Where Assessed - Upper Body Bathing: Supported sitting  Lower Body Bathing: Minimal assistance  Where Assessed - Lower Body Bathing:  Lean right and/or left  Upper Body Dressing: Set up;Supervision/safety  Where Assessed - Upper Body Dressing: Supported sitting  Lower Body Dressing: Minimal assistance  Where Assessed - Lower Body Dressing: Lean right and/or left  Toilet Transfer: Minimal assistance  Toilet Transfer Method: Anterior-posterior  Toilet Transfer Equipment: Other (comment) (from bed to chair)  Toileting - Clothing Manipulation and Hygiene: Moderate assistance  Where Assessed - Toileting Clothing Manipulation and Hygiene: Lean right and/or left  Tub/Shower Transfer Method: Not assessed  Equipment Used: Gait belt  Transfers/Ambulation Related to ADLs: Min A  ADL Comments: Educated on sensations pt may experience. Educated to be tapping/rubbing residual limbs. Pt practiced donning shorts and discussed he could either lean side to side or roll in bed to get them over bottom. Pt asking about bathing-OT explained tub transfer technique and use of tub bench. Gave pt theraband and showed him some exercises to be doing for UE strength. Re-wrapped bottom of bandage on RLE as it appeared circular.     Mobility  Overal bed mobility: Needs Assistance Bed Mobility: Rolling;Supine to Sit Rolling: Supervision Supine to sit: Mod assist General bed mobility comments: Pt able to roll into prone position with extra time and verbal cues for technique. Supine>sit requires Mod A for trunk control .    Transfers  Overall transfer level: Needs assistance Equipment used: None Transfers: Government social research officer transfers: Min assist General transfer comment: Pt able to perform 75% of transfer to chair from bed with downward slope from bed into chair, requiring physical assist for final positioning in reclining chair.    Ambulation / Gait / Stairs / Emergency planning/management officer  Unable.  Not fitted for prosthesis yet.  Wheel chair mobility.    Posture / Balance Dynamic Sitting Balance Sitting balance - Comments: Pt  progressed to no UE for support while sitting EOB    Special needs/care consideration BiPAP/CPAP No CPM No Continuous Drip IV No  Dialysis Yes        Days T-TH-Sat Life Vest No Oxygen No Special Bed No Trach Size No Wound Vac (area) No       Skin Has B BKA incisions with wraps. Has wound on right thumb and on right index finger.  Bowel mgmt: Had BM 08/02/13  Bladder mgmt: Anuric, voids very seldom Diabetic mgmt Yes, on insulin at home.    Previous Home Environment Living Arrangements: Spouse/significant other Available Help at Discharge: Family Type of Home: House Home Layout: Multi-level;Other (Comment) (W/c not able to fit in first level bed/bath) Alternate Level Stairs-Rails: None Alternate Level Stairs-Number of Steps: 1 step to get into living room Home Access: Stairs to enter Entrance Stairs-Rails: Right;Left Entrance Stairs-Number of Steps: 4-5 Additional Comments: Downstairs bathroom with double doors, very wide space with exception of area around toilet. May not be able to enter bedroom on first floor with W/c   Discharge Living Setting Plans for Discharge Living Setting: Patient's home;House;Lives with (comment) (Lives with wife and mother-in-law.) Type of Home at Discharge: House Discharge Home Layout: Multi-level;Able to live on main level with bedroom/bathroom Alternate Level Stairs-Number of Steps: 1 step down to sunken living room. Discharge Home Access: Stairs to enter Entrance Stairs-Rails: Right;Left (Too far apart to reach rails.) Entrance Stairs-Number of Steps: 4-5 steps front entry.  Garage with 3 steps by no hand rail.  Social/Family/Support Systems Patient Roles: Spouse;Parent (Has 1 Dtr, 2 sons in [place], [place] and [place] areas.) Contact Information: Franko Hilliker - wife Anticipated Caregiver: wife Anticipated Caregiver's Contact Information: [name] - (h) [phone] (c) [phone] Ability/Limitations of Caregiver: Wife not  working, can assist, no heavy lifting. Caregiver Availability: 24/7 Discharge Plan Discussed with Primary Caregiver: Yes Is Caregiver In Agreement with Plan?: Yes Does Caregiver/Family have Issues with Lodging/Transportation while Pt is in Rehab?: No  Goals/Additional Needs Patient/Family Goal for Rehab: PT/OT mod I goals Expected length of stay: 8-10 days Cultural Considerations: None Dietary Needs: Renal, carb mod, fluid restriction 1220 ml/day Equipment Needs: TBD Special Service Needs: HD T-TH-SAT Additional Information: No ramp at patient's home.  Limted access to bedroom and bathroom by wheelchair.  Patient and wife are concerned and home PT or OT will come to home to assess. Pt/Family Agrees to Admission and willing to participate: Yes Program Orientation Provided & Reviewed with Pt/Caregiver Including Roles  & Responsibilities: Yes  Decrease burden of Care through IP rehab admission: N/A  Possible need for SNF placement upon discharge: Not planned  Patient Condition: This patient's condition remains as documented in the consult dated 08/02/13, in which the Rehabilitation Physician determined and documented that the patient's condition is appropriate for intensive rehabilitative care in an inpatient rehabilitation facility. Will admit to inpatient rehab today.  Preadmission Screen Completed By:  Retta Diones, 08/03/2013 12:07 PM ______________________________________________________________________   Discussed status with Dr. Naaman Plummer on 08/03/13 at 1241 and received telephone approval for admission today.  Admission Coordinator:  Retta Diones, time1241/Date03/17/15

## 2013-08-03 NOTE — Interval H&P Note (Signed)
Paul [name] was admitted today to Inpatient Rehabilitation with the diagnosis of bilateral BKA.  The patient's history has been reviewed, patient examined, and there is no change in status.  Patient continues to be appropriate for intensive inpatient rehabilitation.  I have reviewed the patient's chart and labs.  Questions were answered to the patient's satisfaction.  [name] T 08/03/2013, 10:35 PM

## 2013-08-03 NOTE — Care Management Note (Signed)
CARE MANAGEMENT NOTE 08/03/2013  Patient:  Paul [name], Paul [name]   Account Number:  [id number]  Date Initiated:  07/30/2013  Documentation initiated by:  Ricki Miller  Subjective/Objective Assessment:   58 yr old male s/p bilateral BKA'S. Has ESRD on HD T/Thurs/Sat.     Action/Plan:   Awaiting PT/OT eval. Possible CIR  Patient will be going to CIR.   Anticipated DC Date:  08/03/2013   Anticipated DC Plan:  IP REHAB FACILITY  In-house referral  Clinical Social Worker      DC Planning Services  CM consult      Choice offered to / List presented to:             Status of service:  Completed, signed off Medicare Important Message given?   (If response is "NO", the following Medicare IM given date fields will be blank) Date Medicare IM given:   Date Additional Medicare IM given:    Discharge Disposition:  IP REHAB FACILITY

## 2013-08-04 ENCOUNTER — Ambulatory Visit: Payer: Managed Care, Other (non HMO) | Admitting: Physical Therapy

## 2013-08-04 ENCOUNTER — Inpatient Hospital Stay (HOSPITAL_COMMUNITY): Payer: Managed Care, Other (non HMO)

## 2013-08-04 ENCOUNTER — Inpatient Hospital Stay (HOSPITAL_COMMUNITY): Payer: Managed Care, Other (non HMO) | Admitting: Physical Therapy

## 2013-08-04 ENCOUNTER — Inpatient Hospital Stay (HOSPITAL_COMMUNITY): Payer: Managed Care, Other (non HMO) | Admitting: Occupational Therapy

## 2013-08-04 ENCOUNTER — Encounter (HOSPITAL_COMMUNITY): Payer: Self-pay | Admitting: *Deleted

## 2013-08-04 DIAGNOSIS — E1149 Type 2 diabetes mellitus with other diabetic neurological complication: Secondary | ICD-10-CM

## 2013-08-04 DIAGNOSIS — S88119A Complete traumatic amputation at level between knee and ankle, unspecified lower leg, initial encounter: Secondary | ICD-10-CM

## 2013-08-04 DIAGNOSIS — N186 End stage renal disease: Secondary | ICD-10-CM

## 2013-08-04 DIAGNOSIS — I70269 Atherosclerosis of native arteries of extremities with gangrene, unspecified extremity: Secondary | ICD-10-CM

## 2013-08-04 DIAGNOSIS — D649 Anemia, unspecified: Secondary | ICD-10-CM

## 2013-08-04 DIAGNOSIS — R1312 Dysphagia, oropharyngeal phase: Secondary | ICD-10-CM

## 2013-08-04 DIAGNOSIS — E1142 Type 2 diabetes mellitus with diabetic polyneuropathy: Secondary | ICD-10-CM

## 2013-08-04 LAB — GLUCOSE, CAPILLARY
GLUCOSE-CAPILLARY: 86 mg/dL (ref 70–99)
Glucose-Capillary: 125 mg/dL — ABNORMAL HIGH (ref 70–99)
Glucose-Capillary: 75 mg/dL (ref 70–99)
Glucose-Capillary: 161 mg/dL — ABNORMAL HIGH (ref 70–99)
Glucose-Capillary: 187 mg/dL — ABNORMAL HIGH (ref 70–99)

## 2013-08-04 MED ORDER — BOOST / RESOURCE BREEZE PO LIQD
1.0000 | ORAL | Status: DC | PRN
  Administered 2013-08-13: 1 via ORAL

## 2013-08-04 MED ORDER — BIOTENE DRY MOUTH MT LIQD
15.0000 mL | Freq: Two times a day (BID) | OROMUCOSAL | Status: DC
  Administered 2013-08-04 – 2013-08-13 (×19): 15 mL via OROMUCOSAL

## 2013-08-04 MED ORDER — SODIUM THIOSULFATE 10 % IV SOLN
25.0000 g | INTRAVENOUS | Status: DC
  Filled 2013-08-04 (×2): qty 250

## 2013-08-04 MED ORDER — NEPRO/CARBSTEADY PO LIQD
237.0000 mL | Freq: Two times a day (BID) | ORAL | Status: DC
  Administered 2013-08-05 – 2013-08-13 (×18): 237 mL via ORAL

## 2013-08-04 MED ORDER — SODIUM THIOSULFATE 10 % IV SOLN
25.0000 g | Freq: Once | INTRAVENOUS | Status: DC
  Filled 2013-08-04: qty 250

## 2013-08-04 NOTE — Progress Notes (Signed)
         Subjective/Complaints: Had a good night. Pain under fair control. Excited to get started today.  A 12 point review of systems has been performed and if not noted above is otherwise negative.   Objective: Vital Signs: Blood pressure 127/72, pulse 75, temperature 98.3 F (36.8 C), temperature source Oral, resp. rate 17, weight 75.6 kg (166 lb 10.7 oz), SpO2 94.00%. No results found.  Recent Labs  08/02/13 0514 08/03/13 1227  WBC 14.9* 8.7  HGB 8.2* 7.1*  HCT 27.5* 23.4*  PLT 424* 374    Recent Labs  08/03/13 1227  NA 137  K 4.7  CL 95*  GLUCOSE 103*  BUN 58*  CREATININE 6.25*  CALCIUM 8.0*   CBG (last 3)   Recent Labs  08/03/13 1733 08/03/13 2150 08/04/13 0723  GLUCAP 121* 166* 125*    Wt Readings from Last 3 Encounters:  08/03/13 75.6 kg (166 lb 10.7 oz)  08/03/13 76.2 kg (167 lb 15.9 oz)  08/03/13 76.2 kg (167 lb 15.9 oz)    Physical Exam:    Assessment/Plan: 1. Functional deficits secondary to bilateral BKA's which require 3+ hours per day of interdisciplinary therapy in a comprehensive inpatient rehab setting. Physiatrist is providing close team supervision and 24 hour management of active medical problems listed below. Physiatrist and rehab team continue to assess barriers to discharge/monitor patient progress toward functional and medical goals. FIM:                   Comprehension Comprehension Mode: Auditory Comprehension: 5-Follows basic conversation/direction: With no assist  Expression Expression Mode: Verbal Expression: 5-Expresses basic needs/ideas: With no assist  Social Interaction Social Interaction: 5-Interacts appropriately 90% of the time - Needs monitoring or encouragement for participation or interaction.  Problem Solving Problem Solving: 5-Solves basic 90% of the time/requires cueing < 10% of the time  Memory Memory: 5-Recognizes or recalls 90% of the time/requires cueing < 10% of the time  Medical  Problem List and Plan:  1. Bilateral BKA secondary to extensive gangrene/calciphylaxis 07/30/2013  2. DVT Prophylaxis/Anticoagulation: Aspirin  3. Pain Management: Neurontin 300 mg 3 times a day, Percocet and Robaxin as needed. Monitor with increased mobility  4. Mood/depression. Zoloft 50 mg daily. Provide emotional support  5. Neuropsych: This patient is capable of making decisions on his own behalf.  6. End-stage renal disease. Continue hemodialysis as per renal services  7. Diabetes mellitus with peripheral neuropathy. Latest hemoglobin A1c 6.7. NovoLog 3 units 3 times a day, Lantus insulin 10 units each bedtime. Check CBGs a.c. and at bedtime---watch for hypoglycemia  8. History of atrial fibrillation. No anticoagulations secondary to suspect Coumadin necrosis. Cardiac rate controlled  9. Acute on chronic anemia. Latest hemoglobin 8.2. Continue Aranesp  10. Hyperlipidemia. Lipitor  11. GERD. Protonix 12. Wound care: remove dressings in 7-10 days  LOS (Days) 1 A FACE TO FACE EVALUATION WAS PERFORMED  Melysa Schroyer T 08/04/2013 7:37 AM

## 2013-08-04 NOTE — Plan of Care (Signed)
Problem: RH SKIN INTEGRITY Goal: RH STG MAINTAIN SKIN INTEGRITY WITH ASSISTANCE STG Maintain Skin Integrity With min Assistance.  Outcome: Not Progressing Patient with denuded skin to sacrum. Allevyn applied to area.adm

## 2013-08-04 NOTE — Progress Notes (Signed)
Patient information reviewed and entered into eRehab system by Katrina Brosh, RN, CRRN, PPS Coordinator.  Information including medical coding and functional independence measure will be reviewed and updated through discharge.    

## 2013-08-04 NOTE — Evaluation (Signed)
Occupational Therapy Assessment and Plan & Session Note  Patient Details  Name: EVELIO RUEDA MRN: [id number] Date of Birth: 1956/03/12  OT Diagnosis: acute pain and muscle weakness (generalized) Rehab Potential: Rehab Potential: Good ELOS: ~10 days   Today's Date: 08/05/2013  Problem List:  Patient Active Problem List   Diagnosis Date Noted  . S/P bilateral BKA (below knee amputation) 08/03/2013  . S/P BKA (below knee amputation) bilateral 07/30/2013  . Open wound of both legs with complication 74/25/9563  . Acute blood loss anemia 07/12/2013  . Syncope 07/09/2013  . Intolerance to CAPD peritoneal dialysis s/p CAPD cath removal 07/09/2013 07/05/2013  . Atherosclerosis of native arteries of the extremities with ulceration(440.23) 06/17/2013  . Gait disorder 05/31/2013  . Weakness 05/24/2013  . Depression 04/03/2013  . GERD (gastroesophageal reflux disease) 04/03/2013  . Atrial fibrillation 04/02/2013  . DM (diabetes mellitus) 12/28/2012  . Other complications due to renal dialysis device, implant, and graft 05/19/2012  . Anemia 07/02/2011  . ESRD on hemodialysis 05/29/2011    Past Medical History:  Past Medical History  Diagnosis Date  . Open wound of both legs with complication     Pt has had progressive wounds of both LE's including gangrene of the toes and patchy necrosis of the calves.  He has been treated at [facility] by Dr Jerline Pain with hyperbaric O2 5d per week.  He is getting Na Thiosulfate with HD for suspected calciphylaxis. Pt says doctor's aren't sure if these ulcers were diabetic ulcers or calciphylaxis.  He underwent bilat BKA on 07/30/13.  He says that the woun  . ESRD on hemodialysis     Pt has ESRD due to DM.  He had a L upper arm AVF prior to starting HD which was ligated due to L arm steal syndrome.  He started HD in Jan 2014 and did home HD.  The family couldn't cannulate the R arm AVF successfully so by mid 2014 they decided to switch to PD which was done late  summer 2014.  In Jan 2015 he was admitted with FTT felt to be due to underdialysis on PD and PD was abandoned and he   . Anemia     esrd  . Hypertension     off of meds due to orthostatic hypotension  . Closed left arm fracture 1967  . Heart murmur   . Corneal abrasion, left   . GERD (gastroesophageal reflux disease)   . History of blood transfusion   . Glomerulosclerosis, diabetic   . Depression   . A-fib   . Calciphylaxis 05/2013    lower ext  . Diabetes mellitus     Type 1 iddm x 11 yrs  . Diabetic neuropathy   . Cancer     hx duodenal adenoCa 2012, resected   Past Surgical History:  Past Surgical History  Procedure Laterality Date  . Whipple procedure  2002    duodenal ca, Dr. Harlow Asa  . Bone marrow biopsy  2012  . Renal biopsy, percutaneous  2012  . Tonsillectomy    . Portacath placement  2002  . Biliary diversion, external  2002  . Other surgical history      Cyst removed from back  . Av fistula placement  06/07/2011    Procedure: ARTERIOVENOUS (AV) FISTULA CREATION;  Surgeon: Angelia Mould, MD;  Location: [place];  Service: Vascular;  Laterality: Left;  . Ligation of arteriovenous  fistula  05/22/2012    Procedure: LIGATION OF ARTERIOVENOUS  FISTULA;  Surgeon: Mal Misty, MD;  Location: [place] [facility];  Service: Vascular;  Laterality: Left;  Left brachio-cephalic fistula  . Insertion of dialysis catheter  05/22/2012    Procedure: INSERTION OF DIALYSIS CATHETER;  Surgeon: Mal Misty, MD;  Location: [place];  Service: Vascular;  Laterality: N/A;  right internal jugular vein  . Av fistula placement  06/18/2012    Procedure: ARTERIOVENOUS (AV) FISTULA CREATION;  Surgeon: Mal Misty, MD;  Location: [place];  Service: Vascular;  Laterality: Right;  [place]  . Colonoscopy      Hx: of  . Capd insertion N/A 01/14/2013    Procedure: LAPAROSCOPIC INSERTION CONTINUOUS AMBULATORY PERITONEAL DIALYSIS  (CAPD) CATHETER;  Surgeon: Adin Hector, MD;  Location: [place];  Service: General;   Laterality: N/A;  . Vascular surgery    . Removal of a dialysis catheter    . Capd removal N/A 07/09/2013    Procedure: CONTINUOUS AMBULATORY PERITONEAL DIALYSIS  (CAPD) CATHETER REMOVAL;  Surgeon: Adin Hector, MD;  Location: [place];  Service: General;  Laterality: N/A;  . Amputation Bilateral 07/30/2013    Procedure: AMPUTATION BELOW KNEE;  Surgeon: Newt Minion, MD;  Location: [place];  Service: Orthopedics;  Laterality: Bilateral;  Bilateral Below Knee Amputations    Clinical Impression: CASSELL VOORHIES is a 58 y.o. right-handed male with history of end-stage renal disease on hemodialysis, insulin-dependent diabetes mellitus with peripheral neuropathy, history of atrial fibrillation with Coumadin discontinued secondary to Coumadin necrosis. Patient received inpatient rehabilitation services January 2015 for multifactorial gait disorder related to diabetes mellitus with peripheral neuropathy/calciphylaxis. He was discharged to home ambulating modified independence using a rolling walker. Admitted 07/30/2013 with extensive gangrene bilateral lower extremities. Patient had been followed at the wound Center. Limb not felt to be salvageable. I know a bilateral lower extremity below-knee amputation 07/30/2013 per Dr. Sharol Given. Postoperative pain management. Hemodialysis ongoing as per renal services. Acute on chronic anemia with latest hemoglobin 8.2. Physical and occupational therapy evaluations completed with recommendations of physical medicine rehabilitation consult. Patient was admitted for comprehensive rehabilitation program. Patient transferred to CIR on 08/03/2013 .    Patient currently requires supervision>mod assist with basic self-care skills secondary to muscle weakness and muscle joint tightness and decreased sitting balance, decreased postural control, decreased balance strategies and difficulty maintaining precautions.  Prior to hospitalization, patient could complete ADLs at a mod I  level.  Patient will benefit from skilled intervention to increase independence with basic self-care skills prior to discharge home with care partner.  Anticipate patient will require intermittent supervision and follow up home health.  OT - End of Session Activity Tolerance: Tolerates 30+ min activity with multiple rests Endurance Deficit: Yes OT Assessment Rehab Potential: Good Barriers to Discharge: Inaccessible home environment Barriers to Discharge Comments: patient unsure if w/c will fit throughout house, may plan for a home evaluation OT Patient demonstrates impairments in the following area(s): Balance;Endurance;Pain;Safety;Sensory;Skin Integrity OT Basic ADL's Functional Problem(s): Grooming;Bathing;Dressing;Toileting OT Advanced ADL's Functional Problem(s):  (n/a at this time) OT Transfers Functional Problem(s): Toilet;Tub/Shower OT Additional Impairment(s): None (strengthening > BUEs) OT Plan OT Intensity: Minimum of 1-2 x/day, 45 to 90 minutes OT Frequency: 5 out of 7 days OT Duration/Estimated Length of Stay: ~10 days OT Treatment/Interventions: Balance/vestibular training;Community reintegration;Discharge planning;DME/adaptive equipment instruction;Pain management;Patient/family education;Psychosocial support;Self Care/advanced ADL retraining;Skin care/wound managment;Therapeutic Activities;Therapeutic Exercise;UE/LE Strength taining/ROM;Wheelchair propulsion/positioning;UE/LE Coordination activities OT Self Feeding Anticipated Outcome(s): independent (current level) OT Basic Self-Care Anticipated Outcome(s): mod I OT Toileting Anticipated Outcome(s): mod I  OT Bathroom Transfers Anticipated Outcome(s): supervision OT Recommendation Patient destination: Home Follow Up Recommendations: Home health OT Equipment Recommended: Tub/shower bench;3 in 1 bedside comode (drop arm BSC)  Precautions/Restrictions  Precautions Precautions: Fall Precaution Booklet Issued:  No Restrictions Weight Bearing Restrictions: Yes RLE Weight Bearing: Non weight bearing LLE Weight Bearing: Non weight bearing  General Chart Reviewed: Yes Family/Caregiver Present: No  Pain Pain Assessment Pain Assessment: 0-10 Pain Score: 3  Pain Type: Acute pain;Surgical pain Pain Location: Leg Pain Orientation: Right;Left Pain Descriptors / Indicators: Aching Pain Onset: Gradual Multiple Pain Sites: No   Home Living/Prior Functioning Home Living Available Help at Discharge: Family Type of Home: House Home Access: Stairs to enter Technical brewer of Steps: 4-5 Home Layout: Multi-level;Able to live on main level with bedroom/bathroom Additional Comments: Downstairs bathroom with double doors, very wide space with exception of area around toilet. 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: visits with children and grandchildren, woodworking, playing golf  Prior Function Level of Independence: Independent with basic ADLs;Requires assistive device for independence  Able to Take Stairs?: Reciprically Driving: No (patient states he hasn't driven in months)  ADL - See FIM  Vision/Perception  Vision - History Baseline Vision: Bifocals Patient Visual Report: No change from baseline Vision - Assessment Eye Alignment: Within Functional Limits Perception Perception: Within Functional Limits Praxis Praxis: Intact   Cognition Overall Cognitive Status: Within Functional Limits for tasks assessed Arousal/Alertness: Awake/alert Orientation Level: Oriented X4 Memory: Appears intact Awareness: Appears intact Problem Solving: Appears intact Safety/Judgment: Appears intact  Sensation Sensation Additional Comments: patient reports no  numbness or tingling "anymore", patient reports numbeness and tingling from fistula placement Coordination Gross Motor Movements are Fluid and Coordinated: Yes (decreased, patient states some gross motor activities are difficult) Fine Motor Movements are Fluid and Coordinated: Yes (decreased, patient states somefine motor activities are difficult)  Motor  Motor Motor: Within Functional Limits  Trunk/Postural Assessment  Cervical Assessment Cervical Assessment: Within Functional Limits Thoracic Assessment Thoracic Assessment: Within Functional Limits Lumbar Assessment Lumbar Assessment: Within Functional Limits Postural Control Postural Control: Within Functional Limits   Balance Balance Balance Assessed: Yes Static Sitting Balance Static Sitting - Level of Assistance: 5: Stand by assistance Dynamic Sitting Balance Dynamic Sitting - Level of Assistance: 5: Stand by assistance  Extremity/Trunk Assessment RUE Assessment RUE Assessment: Within Functional Limits (decreased strength throughout shoulder, can benefit from strengthening) LUE Assessment LUE Assessment: Within Functional Limits (decreased strength throughout shoulder, can benefit from strengthening)  FIM:  FIM - Eating Eating Activity: 7: Complete independence:no helper FIM - Grooming Grooming: 0: Activity did not occur FIM - Bathing Bathing Steps Patient Completed: Chest;Right Arm;Left Arm;Abdomen Bathing: 3: Mod-Patient completes 5-7 [id number] parts or 50-74% FIM - Upper Body Dressing/Undressing Upper body dressing/undressing steps patient completed: Thread/unthread right sleeve of pullover shirt/dresss;Thread/unthread left sleeve of pullover shirt/dress;Put head through opening of pull over shirt/dress;Pull shirt over trunk Upper body dressing/undressing: 5: Set-up assist to: Obtain clothing/put away FIM - Lower Body Dressing/Undressing Lower body dressing/undressing: 0: Activity did not occur FIM -  Toileting Toileting: 0: Activity did not occur FIM - BControl and instrumentation engineerDevices: HOB elevated Bed/Chair Transfer: 5: Supine > Sit: Supervision (verbal cues/safety issues);3: Bed > Chair or W/C: Mod A (lift or lower assist) FIM -  Air cabin crew Transfers: 0-Activity did not occur FIM - Camera operator Transfers: 0-Activity did not occur or was simulated   Refer to Care Plan for Long Term Goals  Recommendations for other services: None  Discharge Criteria: Patient will be discharged from OT if patient refuses treatment 3 consecutive times without medical reason, if treatment goals not met, if there is a change in medical status, if patient makes no progress towards goals or if patient is discharged from hospital.  The above assessment, treatment plan, treatment alternatives and goals were discussed and mutually agreed upon: by patient  ---------------------------------------------------------------------------------------------------------------------------  SESSION NOTE 0935-1030 - 76 Minutes Individual Therapy Patient with 3/10 complaints of pain, RN aware Initial 1:1 occupational therapy evaluation completed. Focused skilled intervention on bed mobility, UB bathing & dressing, EOB > w/c posterior transfer, functional use of BUEs, and overall activity tolerance/endurance. Therapist also discussed d/c planning and home set-up with patient. Recommending patient have wife take measurements throughout the house and pictures of bathroom. Patient concerned that w/c will not fit throughout house, may plan for a home evaluation closer to discharge. At end of session, left patient seated in w/c with PT present for next session.   Colin Ellers 08/05/2013, 8:14 AM

## 2013-08-04 NOTE — Progress Notes (Signed)
Physical Therapy Note  Patient Details  Name: Paul [name] MRN: [id number] Date of Birth: 02/28/1956 Today's Date: 08/04/2013  1300-1325 d(25 minutes) individual Pain: 2/10 bilateral BKA/ premedicated Focus of treatment: transfer training; therapeutic exercise focused on activity tolerance Treatment: Pt in bed upon arrival; transfer posterior transfer to wc SBA with increased time and vcs for technique; wc mobility 120 feet SBA ; UE ergonometer X 10 minutes Level 2; returned to room with all needs within reach.    Lilyann Gravelle,JIM 08/04/2013, 1:23 PM

## 2013-08-04 NOTE — Evaluation (Signed)
Physical Therapy Assessment and Plan  Patient Details  Name: Paul [name] MRN: [id number] Date of Birth: 31-Oct-1955  PT Diagnosis: Difficulty walking, Edema, Impaired sensation, Muscle weakness and Pain in right and left lower leg Rehab Potential: Good ELOS: 10 days   Today's Date: 08/04/2013 Time: 5397-6734 Time Calculation (min): 62 min  Problem List:  Patient Active Problem List   Diagnosis Date Noted  . S/P bilateral BKA (below knee amputation) 08/03/2013  . S/P BKA (below knee amputation) bilateral 07/30/2013  . Open wound of both legs with complication 19/37/9024  . Acute blood loss anemia 07/12/2013  . Syncope 07/09/2013  . Intolerance to CAPD peritoneal dialysis s/p CAPD cath removal 07/09/2013 07/05/2013  . Atherosclerosis of native arteries of the extremities with ulceration(440.23) 06/17/2013  . Gait disorder 05/31/2013  . Weakness 05/24/2013  . Depression 04/03/2013  . GERD (gastroesophageal reflux disease) 04/03/2013  . Atrial fibrillation 04/02/2013  . DM (diabetes mellitus) 12/28/2012  . Other complications due to renal dialysis device, implant, and graft 05/19/2012  . Anemia 07/02/2011  . ESRD on hemodialysis 05/29/2011    Past Medical History:  Past Medical History  Diagnosis Date  . Open wound of both legs with complication     Pt has had progressive wounds of both LE's including gangrene of the toes and patchy necrosis of the calves.  He has been treated at [facility] by Dr Jerline Pain with hyperbaric O2 5d per week.  He is getting Na Thiosulfate with HD for suspected calciphylaxis. Pt says doctor's aren't sure if these ulcers were diabetic ulcers or calciphylaxis.  He underwent bilat BKA on 07/30/13.  He says that the woun  . ESRD on hemodialysis     Pt has ESRD due to DM.  He had a L upper arm AVF prior to starting HD which was ligated due to L arm steal syndrome.  He started HD in Jan 2014 and did home HD.  The family couldn't cannulate the R arm AVF  successfully so by mid 2014 they decided to switch to PD which was done late summer 2014.  In Jan 2015 he was admitted with FTT felt to be due to underdialysis on PD and PD was abandoned and he   . Anemia     esrd  . Hypertension     off of meds due to orthostatic hypotension  . Closed left arm fracture 1967  . Heart murmur   . Corneal abrasion, left   . GERD (gastroesophageal reflux disease)   . History of blood transfusion   . Glomerulosclerosis, diabetic   . Depression   . A-fib   . Calciphylaxis 05/2013    lower ext  . Diabetes mellitus     Type 1 iddm x 11 yrs  . Diabetic neuropathy   . Cancer     hx duodenal adenoCa 2012, resected   Past Surgical History:  Past Surgical History  Procedure Laterality Date  . Whipple procedure  2002    duodenal ca, Dr. Harlow Asa  . Bone marrow biopsy  2012  . Renal biopsy, percutaneous  2012  . Tonsillectomy    . Portacath placement  2002  . Biliary diversion, external  2002  . Other surgical history      Cyst removed from back  . Av fistula placement  06/07/2011    Procedure: ARTERIOVENOUS (AV) FISTULA CREATION;  Surgeon: Angelia Mould, MD;  Location: [facility] OR;  Service: Vascular;  Laterality: Left;  . Ligation of arteriovenous  fistula  05/22/2012    Procedure: LIGATION OF ARTERIOVENOUS  FISTULA;  Surgeon: Mal Misty, MD;  Location: [place];  Service: Vascular;  Laterality: Left;  Left brachio-cephalic fistula  . Insertion of dialysis catheter  05/22/2012    Procedure: INSERTION OF DIALYSIS CATHETER;  Surgeon: Mal Misty, MD;  Location: [place];  Service: Vascular;  Laterality: N/A;  right internal jugular vein  . Av fistula placement  06/18/2012    Procedure: ARTERIOVENOUS (AV) FISTULA CREATION;  Surgeon: Mal Misty, MD;  Location: [place];  Service: Vascular;  Laterality: Right;  [place]  . Colonoscopy      Hx: of  . Capd insertion N/A 01/14/2013    Procedure: LAPAROSCOPIC INSERTION CONTINUOUS AMBULATORY PERITONEAL DIALYSIS  (CAPD)  CATHETER;  Surgeon: Adin Hector, MD;  Location: [place];  Service: General;  Laterality: N/A;  . Vascular surgery    . Removal of a dialysis catheter    . Capd removal N/A 07/09/2013    Procedure: CONTINUOUS AMBULATORY PERITONEAL DIALYSIS  (CAPD) CATHETER REMOVAL;  Surgeon: Adin Hector, MD;  Location: [place];  Service: General;  Laterality: N/A;  . Amputation Bilateral 07/30/2013    Procedure: AMPUTATION BELOW KNEE;  Surgeon: Newt Minion, MD;  Location: [place];  Service: Orthopedics;  Laterality: Bilateral;  Bilateral Below Knee Amputations    Assessment & Plan Clinical Impression: Patient is a 58 y.o. right-handed male with history of end-stage renal disease on hemodialysis, insulin-dependent diabetes mellitus with peripheral neuropathy, history of atrial fibrillation with Coumadin discontinued secondary to Coumadin necrosis. Patient received inpatient rehabilitation services January 2015 for multifactorial gait disorder related to diabetes mellitus with peripheral neuropathy/calciphylaxis. He was discharged to home ambulating modified independence using a rolling walker. Admitted 07/30/2013 with extensive gangrene bilateral lower extremities. Patient had been followed at the wound Center. Limb not felt to be salvageable. I know a bilateral lower extremity below-knee amputation 07/30/2013 per Dr. Sharol Given. Postoperative pain management. Hemodialysis ongoing as per renal services. Acute on chronic anemia with latest hemoglobin 8.2. Physical and occupational therapy evaluations completed with recommendations of physical medicine rehabilitation consult. Patient was admitted for comprehensive rehabilitation program. Patient transferred to CIR on 08/03/2013 .   Patient currently requires S-light Mod A with functional mobility secondary to muscle weakness and muscle joint tightness, decreased cardiorespiratoy endurance and decreased sitting balance, and patients recent bilateral BKA.  Prior to hospitalization,  patient was independent  with mobility with use of a RW and lived with wife and mother in [place] in a [place].  Home access is 4-5 outside steps with bilateral handrails with three steps to enter through garage without handrail.  Patient will benefit from skilled PT intervention to maximize safe functional mobility, minimize fall risk and decrease caregiver burden for planned discharge home with intermittent assist.  Anticipate patient will benefit from follow up [facility] at discharge.  PT - End of Session Activity Tolerance: Tolerates 30+ min activity with multiple rests Endurance Deficit: Yes PT Assessment Rehab Potential: Good Barriers to Discharge: Inaccessible home environment Barriers to Discharge Comments: Concerns of doorway dimensions in home and pt has stairs to enter home PT Patient demonstrates impairments in the following area(s): Balance;Edema;Endurance;Pain;Safety;Sensory;Skin Integrity PT Transfers Functional Problem(s): Bed Mobility;Bed to Chair;Car;Furniture PT Locomotion Functional Problem(s): Wheelchair Mobility;Ambulation;Stairs PT Plan PT Intensity: Minimum of 1-2 x/day ,45 to 90 minutes PT Frequency: 5 out of 7 days PT Duration Estimated Length of Stay: 10 days PT Treatment/Interventions: Balance/vestibular training;Community reintegration;Discharge planning;Disease management/prevention;DME/adaptive equipment instruction;Functional mobility training;Neuromuscular re-education;Pain  management;Patient/family education;Psychosocial support;Skin care/wound management;Splinting/orthotics;Therapeutic Activities;Therapeutic Exercise;UE/LE Strength taining/ROM;UE/LE Coordination activities;Wheelchair propulsion/positioning;Stair training PT Transfers Anticipated Outcome(s): Mod I basic transfers; Min A car transfer PT Locomotion Anticipated Outcome(s): Mod I w/c mobility PT Recommendation Follow Up Recommendations: Home health PT Patient destination: Home Equipment Recommended:  Wheelchair (measurements);Wheelchair cushion (measurements);Sliding board (J2 gel cushion and bilateral amputee pads) Equipment Details: also recommend pt install a ramp for safe entrance into home  Skilled Therapeutic Intervention Session consisted of pt performing anterior-posterior transfers with Min-Mod A (pt needing lifting assistance in order to elevate bottom onto w/c cushion with constant verbal cueing needed to properly shift hips in order to perform transfer and for proper hand positioning throughout), pt performing w/c mobility from his room to the rehab gym with S (pt needing verbal cueing to safely propel w/c out of room) in order to improve functional independence, and performing bed mobility with Min A (pt needing verbal cueing for proper log rolling technique with pt also requiring slight upper body assistance in order to go from sidelying to sit). Pt presenting with UE weakness during functional mobility with pt also needing multiple rest breaks due to poor endurance.   PT Evaluation Precautions/Restrictions Precautions Precautions: Fall Precaution Booklet Issued: No Restrictions Weight Bearing Restrictions: Yes RLE Weight Bearing: Non weight bearing LLE Weight Bearing: Non weight bearing Pain Pt c/o 2/10 bilateral LE pain throughout the session. Pt was premedicated for pain and took rest breaks throughout in order to help alleviate this pain.   Home Living/Prior Functioning Home Living Available Help at Discharge: Family Type of Home: House Home Access: Stairs to enter [employer] of Steps: 4-5 (with three steps to enter through garage without handrail) Entrance Stairs-Rails: Right;Left Home Layout: Multi-level;Able to live on main level with bedroom/bathroom Alternate Level Stairs-Number of Steps: 1 step to get into living room Alternate Level Stairs-Rails: None Additional Comments: Downstairs bathroom with double doors, very wide space with exception of area  around toilet. May not be able to enter bedroom on first floor with W/c  (Pt discussed posibility of putting ramp in garage entrance)  Lives With: Spouse (patient lives with wife and mother in [place]) Prior Function Level of Independence: Independent with gait;Independent with transfers  Able to Take Stairs?: Reciprically Driving: No (hasnt driven since december) Vocation: On disability Cognition Overall Cognitive Status: Within Functional Limits for tasks assessed Arousal/Alertness: Awake/alert Orientation Level: Oriented X4 Memory: Appears intact Awareness: Appears intact Problem Solving: Appears intact Safety/Judgment: Appears intact Sensation Sensation Light Touch: Appears Intact Additional Comments: pt had no c/o of numbness of tingling in BLE's but did c/o phantom limb pain on multiple occasions Coordination Gross Motor Movements are Fluid and Coordinated: Yes Motor  Motor Motor: Within Functional Limits  Trunk/Postural Assessment  Cervical Assessment Cervical Assessment: Within Functional Limits Thoracic Assessment Thoracic Assessment: Within Functional Limits Lumbar Assessment Lumbar Assessment: Within Functional Limits Postural Control Postural Control: Within Functional Limits  Balance Balance Balance Assessed: Yes Static Sitting Balance Static Sitting - Level of Assistance: 5: Stand by assistance Dynamic Sitting Balance Dynamic Sitting - Level of Assistance: 5: Stand by assistance Extremity Assessment  RUE Assessment RUE Assessment: Within Functional Limits (decreased strength throughout shoulder, can benefit from strengthening) LUE Assessment LUE Assessment: Within Functional Limits (decreased strength throughout shoulder, can benefit from strengthening) RLE Assessment RLE Assessment: Exceptions to WFL (BKA) RLE Strength RLE Overall Strength Comments: MMT Grades: Hip Flexion 4+/5, Knee Extension 4+/5, Hip Abduction 4/5 RLE AROM comments: able to get a few  degrees short  of full knee extension LLE Assessment LLE Assessment: Exceptions to WFL (BKA) LLE Strength LLE Overall Strength Comments: MMT Grades: Hip Flexion 4+/5, Knee Extension 4+/5, Hip Abduction 4/5 LLE AROM comments: able to get a few degrees short of full knee extension  FIM:  FIM - Bed/Chair Transfer Bed/Chair Transfer Assistive Devices: HOB elevated Bed/Chair Transfer: 4: Supine > Sit: Min A (steadying Pt. > 75%/lift 1 leg);4: Sit > Supine: Min A (steadying pt. > 75%/lift 1 leg);3: Bed > Chair or W/C: Mod A (lift or lower assist);3: Chair or W/C > Bed: Mod A (lift or lower assist) (anterior posterior transfer) FIM - Locomotion: Wheelchair Locomotion: Wheelchair: 5: Travels 150 ft or more: maneuvers on rugs and over door sills with supervision, cueing or coaxing FIM - Locomotion: Ambulation Locomotion: Ambulation: 0: Activity did not occur (Bilateral BKA) FIM - Locomotion: Stairs Locomotion: Stairs: 0: Activity did not occur (Bilateral BKA)   Refer to Care Plan for Long Term Goals  Recommendations for other services: None  Discharge Criteria: Patient will be discharged from PT if patient refuses treatment 3 consecutive times without medical reason, if treatment goals not met, if there is a change in medical status, if patient makes no progress towards goals or if patient is discharged from hospital.  The above assessment, treatment plan, treatment alternatives and goals were discussed and mutually agreed upon: by patient  Clayton,William 08/04/2013, 12:21 PM

## 2013-08-04 NOTE — Progress Notes (Addendum)
I saw the patient and agree with the above assessment and plan.     Given pt w/ bx proven calciphylaxis, I think reasonable to cont Na thiosulfate until amputation site well healed.  Can cont w/ HD Tx.

## 2013-08-04 NOTE — Evaluation (Signed)
Reviewed and in agreement with evaluation assessment and treatment provided.

## 2013-08-04 NOTE — Progress Notes (Signed)
Subjective:  Tolerating PT / hd yesterday and tolerated/ he wants iv out of wrist only receiving sodium thiosulfate at hd ,iV  hindering rehab =can dc IV Objective Vital signs in last 24 hours: Filed Vitals:   08/03/13 1830 08/04/13 0546  BP: 124/74 127/72  Pulse: 90 75  Temp: 98.7 F (37.1 C) 98.3 F (36.8 C)  TempSrc: Oral Oral  Resp: 18 17  Weight: 75.6 kg (166 lb 10.7 oz)   SpO2: 95% 94%  Exam:  Alert,NAD CTA bilat  RRR soft SEM no RG  Abd soft, nt, nd,   Bilat BKA w dressings in place, no LE edema  R FA fistula patent   Dialysis: TTS [facility]  4h 81.5kg 2/2.5 Bath RFA AVF Heparin none (due to leg wounds - he has clotted system 2x recentl) > resuming tight hep x 2 then advance as needed  Epo 18000 Na thiosulfate 25gm tiw   Assessment:  1 Gangrene bilat LE's, s/p bilat BKA  Now on rehab 2 ESRD on hemodialysis TTS 3 Calciphylaxis- on Na thio w HD  4 Anemia - hgb 7.1 Aranesp 150 mcg  q tues  Hd /iron load started 5 MBD cont sevelamer ac  6 Hx afib, not on any Rx  7 DM on insulin  8 Hx HTN-   BP stable off of BP meds due to orthostatic hypotension  9 Volume- no vol excess, dry wt will need to be lowered from bilat amp 10. Nutrition - Alb 1.4  With bka/ esrd / prior PD pt/ On supplement and renal carb mod diet / renal vit.  Labs: Basic Metabolic Panel:  Recent Labs Lab 07/30/13 1402 08/03/13 1227  NA 141 137  K 4.3 4.7  CL 101 95*  CO2 25 25  GLUCOSE 87 103*  BUN 25* 58*  CREATININE 4.06* 6.25*  CALCIUM 7.8* 8.0*  PHOS 3.3 4.2   Liver Function Tests:  Recent Labs Lab 07/30/13 1402 08/03/13 1227  ALBUMIN 1.3* 1.4*    Recent Labs Lab 07/30/13 1402 08/02/13 0514 08/03/13 1227  WBC 14.4* 14.9* 8.7  HGB 7.2* 8.2* 7.1*  HCT 23.8* 27.5* 23.4*  MCV 93.3 91.7 90.0  PLT 298 424* 374  CBG:  Recent Labs Lab 08/03/13 1135 08/03/13 1733 08/03/13 2150 08/04/13 0723 08/04/13 1144  GLUCAP 71 121* 166* 125* 75  Medications:   . antiseptic oral rinse  15 mL  Mouth Rinse BID  . aspirin EC  325 mg Oral Daily  . atorvastatin  10 mg Oral q1800  . [START ON 08/10/2013] darbepoetin (ARANESP) injection - DIALYSIS  150 mcg Intravenous Q Tue-HD  . feeding supplement (RESOURCE BREEZE)  1 Container Oral TID BM  . [START ON 08/05/2013] ferric gluconate (FERRLECIT/NULECIT) IV  125 mg Intravenous Q T,Th,Sa-HD  . gabapentin  300 mg Oral TID  . insulin aspart  0-15 Units Subcutaneous TID WC  . insulin aspart  3 Units Subcutaneous TID WC  . insulin glargine  10 Units Subcutaneous QHS  . multivitamin  1 tablet Oral QHS  . pantoprazole  40 mg Oral Daily  . sertraline  50 mg Oral Daily  . sevelamer carbonate  1,600 mg Oral TID WC    Ernest Haber, PA-C [facility] Beeper [phone] 08/04/2013,1:06 PM  LOS: 1 day

## 2013-08-04 NOTE — Progress Notes (Signed)
INITIAL NUTRITION ASSESSMENT  DOCUMENTATION CODES Per approved criteria  -Not Applicable   INTERVENTION: 1.  Supplements; change Breeze to PRN.  Add Nepro Shake po BID, each supplement provides 425 kcal and 19 grams protein per pt request.  NUTRITION DIAGNOSIS: Increased nutrient needs related to metabolic demand as evidenced by rehab participant.   Monitor:  1.  Food/Beverage; pt meeting >/=90% estimated needs with tolerance. 2.  Wt/wt change; monitor trends  Reason for Assessment: RN request  58 y.o. male  Admitting Dx: deconditioning  ASSESSMENT: Pt admitted for rehab s/p amputation; pt now with bilateral BKAs. Pt known to this RD from previous admission when education r/t Renal diet was presented to pt and family.  Per RN, pt requesting Nepro shakes as he prefers them to Reed supplements.  RD to order for pt.   PO intake 95-100% Renal/CHO Mod meals.   Height: Ht Readings from Last 1 Encounters:  07/30/13 $RemoveB'6\' 2"'vNrHawLF$  (1.88 m)    Weight: Wt Readings from Last 1 Encounters:  08/03/13 166 lb 10.7 oz (75.6 kg)    Ideal Body Weight: 190 lbs  % Ideal Body Weight: 87%  Wt Readings from Last 10 Encounters:  08/03/13 166 lb 10.7 oz (75.6 kg)  08/03/13 167 lb 15.9 oz (76.2 kg)  08/03/13 167 lb 15.9 oz (76.2 kg)  07/21/13 181 lb (82.101 kg)  07/13/13 178 lb 9.2 oz (81 kg)  07/09/13 183 lb 3.2 oz (83.099 kg)  07/09/13 183 lb 3.2 oz (83.099 kg)  07/05/13 185 lb 3.2 oz (84.006 kg)  06/25/13 181 lb (82.101 kg)  06/17/13 189 lb (85.73 kg)    Usual Body Weight: 180 lbs prior to amputation  % Usual Body Weight: 92%  BMI:  Body mass index is 21.39 kg/(m^2).  Estimated Nutritional Needs: Kcal: 2200-2500  Protein: >/= 100 grams daily  Fluid: 1.2 L/day  Skin: Incision to leg, Stage 1 pressure ulcer  Diet Order: Diabetic  EDUCATION NEEDS: -Education needs addressed   Intake/Output Summary (Last 24 hours) at 08/04/13 1514 Last data filed at 08/04/13 1215  Gross per  24 hour  Intake    240 ml  Output      0 ml  Net    240 ml    Last BM: 3/18  Labs:   Recent Labs Lab 07/30/13 1402 08/03/13 1227  NA 141 137  K 4.3 4.7  CL 101 95*  CO2 25 25  BUN 25* 58*  CREATININE 4.06* 6.25*  CALCIUM 7.8* 8.0*  PHOS 3.3 4.2  GLUCOSE 87 103*    CBG (last 3)   Recent Labs  08/04/13 0723 08/04/13 1144 08/04/13 1449  GLUCAP 125* 75 161*    Scheduled Meds: . antiseptic oral rinse  15 mL Mouth Rinse BID  . aspirin EC  325 mg Oral Daily  . atorvastatin  10 mg Oral q1800  . [START ON 08/10/2013] darbepoetin (ARANESP) injection - DIALYSIS  150 mcg Intravenous Q Tue-HD  . [START ON 08/05/2013] feeding supplement (NEPRO CARB STEADY)  237 mL Oral BID BM  . [START ON 08/05/2013] ferric gluconate (FERRLECIT/NULECIT) IV  125 mg Intravenous Q T,Th,Sa-HD  . gabapentin  300 mg Oral TID  . insulin aspart  0-15 Units Subcutaneous TID WC  . insulin aspart  3 Units Subcutaneous TID WC  . insulin glargine  10 Units Subcutaneous QHS  . multivitamin  1 tablet Oral QHS  . pantoprazole  40 mg Oral Daily  . sertraline  50 mg Oral Daily  .  sevelamer carbonate  1,600 mg Oral TID WC    Continuous Infusions:   Past Medical History  Diagnosis Date  . Open wound of both legs with complication     Pt has had progressive wounds of both LE's including gangrene of the toes and patchy necrosis of the calves.  He has been treated at [facility] by Dr Jerline Pain with hyperbaric O2 5d per week.  He is getting Na Thiosulfate with HD for suspected calciphylaxis. Pt says doctor's aren't sure if these ulcers were diabetic ulcers or calciphylaxis.  He underwent bilat BKA on 07/30/13.  He says that the woun  . ESRD on hemodialysis     Pt has ESRD due to DM.  He had a L upper arm AVF prior to starting HD which was ligated due to L arm steal syndrome.  He started HD in Jan 2014 and did home HD.  The family couldn't cannulate the R arm AVF successfully so by mid 2014 they decided to switch to  PD which was done late summer 2014.  In Jan 2015 he was admitted with FTT felt to be due to underdialysis on PD and PD was abandoned and he   . Anemia     esrd  . Hypertension     off of meds due to orthostatic hypotension  . Closed left arm fracture 1967  . Heart murmur   . Corneal abrasion, left   . GERD (gastroesophageal reflux disease)   . History of blood transfusion   . Glomerulosclerosis, diabetic   . Depression   . A-fib   . Calciphylaxis 05/2013    lower ext  . Diabetes mellitus     Type 1 iddm x 11 yrs  . Diabetic neuropathy   . Cancer     hx duodenal adenoCa 2012, resected    Past Surgical History  Procedure Laterality Date  . Whipple procedure  2002    duodenal ca, Dr. Harlow Asa  . Bone marrow biopsy  2012  . Renal biopsy, percutaneous  2012  . Tonsillectomy    . Portacath placement  2002  . Biliary diversion, external  2002  . Other surgical history      Cyst removed from back  . Av fistula placement  06/07/2011    Procedure: ARTERIOVENOUS (AV) FISTULA CREATION;  Surgeon: Angelia Mould, MD;  Location: [place];  Service: Vascular;  Laterality: Left;  . Ligation of arteriovenous  fistula  05/22/2012    Procedure: LIGATION OF ARTERIOVENOUS  FISTULA;  Surgeon: Mal Misty, MD;  Location: [place];  Service: Vascular;  Laterality: Left;  Left brachio-cephalic fistula  . Insertion of dialysis catheter  05/22/2012    Procedure: INSERTION OF DIALYSIS CATHETER;  Surgeon: Mal Misty, MD;  Location: [place];  Service: Vascular;  Laterality: N/A;  right internal jugular vein  . Av fistula placement  06/18/2012    Procedure: ARTERIOVENOUS (AV) FISTULA CREATION;  Surgeon: Mal Misty, MD;  Location: [place];  Service: Vascular;  Laterality: Right;  [place]  . Colonoscopy      Hx: of  . Capd insertion N/A 01/14/2013    Procedure: LAPAROSCOPIC INSERTION CONTINUOUS AMBULATORY PERITONEAL DIALYSIS  (CAPD) CATHETER;  Surgeon: Adin Hector, MD;  Location: [place];  Service: General;   Laterality: N/A;  . Vascular surgery    . Removal of a dialysis catheter    . Capd removal N/A 07/09/2013    Procedure: CONTINUOUS AMBULATORY PERITONEAL DIALYSIS  (CAPD) CATHETER REMOVAL;  Surgeon: Adin Hector, MD;  Location: [place];  Service: General;  Laterality: N/A;  . Amputation Bilateral 07/30/2013    Procedure: AMPUTATION BELOW KNEE;  Surgeon: Newt Minion, MD;  Location: [place];  Service: Orthopedics;  Laterality: Bilateral;  Bilateral Below Knee Amputations    Brynda Greathouse, MS RD LDN Clinical Inpatient Dietitian Pager: [phone] Weekend/After hours pager: [phone]

## 2013-08-04 NOTE — Progress Notes (Signed)
Occupational Therapy Session Note  Patient Details  Name: Paul [name] MRN: [id number] Date of Birth: [birth year]  Today's Date: 08/04/2013 Time: 1415-1500 Time Calculation (min): 45 min  Short Term Goals: Week 1:  OT Short Term Goal 1 (Week 1): Patient will perform toilet transfer with minimal assistance using DME OT Short Term Goal 2 (Week 1): Patient will complete LB dressing(pants only) with minimal assistance OT Short Term Goal 3 (Week 1): Patient will be educated on a BUE strengthening HEP in order to increase overall independence with functional transfers  Skilled Therapeutic Interventions/Progress Updates: ADL-retraining with focus on transfers (anterior/poster and slide board), bed mobility and dynamic sitting balance.   Patient completed anterior transfer to bed with steadying assist, slide board transfer from bed to w/c with mod assist (lifting, pt = 70%) and returned to bed with similar assist.  Patient educated on tub bench transfer and toilet transfer using standard bedside commode without practice this session.   Spouse present throughout session and was educated on goals of treatment reflecting her clarification of not being able to provide lifting assist.   Spouse also reported plan to capture images of home interior using her smart phone.    Therapy Documentation Precautions:  Precautions Precautions: Fall Precaution Booklet Issued: No Restrictions Weight Bearing Restrictions: Yes RLE Weight Bearing: Non weight bearing LLE Weight Bearing: Non weight bearing  Vital Signs: Therapy Vitals Temp: 98.4 F (36.9 C) Temp src: Oral Pulse Rate: 88 Resp: 18 BP: 113/71 mmHg Patient Position, if appropriate: Sitting Oxygen Therapy SpO2: 95 % O2 Device: None (Room air)  Pain: 2/10   See FIM for current functional status  Therapy/Group: Individual Therapy  [place] 08/04/2013, 3:35 PM

## 2013-08-05 ENCOUNTER — Inpatient Hospital Stay (HOSPITAL_COMMUNITY): Payer: Managed Care, Other (non HMO) | Admitting: Physical Therapy

## 2013-08-05 ENCOUNTER — Inpatient Hospital Stay (HOSPITAL_COMMUNITY): Payer: Managed Care, Other (non HMO) | Admitting: Occupational Therapy

## 2013-08-05 ENCOUNTER — Encounter (HOSPITAL_COMMUNITY): Payer: Managed Care, Other (non HMO) | Admitting: Occupational Therapy

## 2013-08-05 LAB — CBC
HEMATOCRIT: 21.6 % — AB (ref 39.0–52.0)
Hemoglobin: 6.9 g/dL — CL (ref 13.0–17.0)
MCH: 28 pg (ref 26.0–34.0)
MCHC: 31.9 g/dL (ref 30.0–36.0)
MCV: 87.8 fL (ref 78.0–100.0)
Platelets: 455 10*3/uL — ABNORMAL HIGH (ref 150–400)
RBC: 2.46 MIL/uL — ABNORMAL LOW (ref 4.22–5.81)
RDW: 17.4 % — AB (ref 11.5–15.5)
WBC: 15.5 10*3/uL — ABNORMAL HIGH (ref 4.0–10.5)

## 2013-08-05 LAB — RENAL FUNCTION PANEL
Albumin: 1.6 g/dL — ABNORMAL LOW (ref 3.5–5.2)
BUN: 65 mg/dL — AB (ref 6–23)
CALCIUM: 8.5 mg/dL (ref 8.4–10.5)
CO2: 19 mEq/L (ref 19–32)
CREATININE: 6.03 mg/dL — AB (ref 0.50–1.35)
Chloride: 91 mEq/L — ABNORMAL LOW (ref 96–112)
GFR calc Af Amer: 11 mL/min — ABNORMAL LOW (ref >90)
GFR, EST NON AFRICAN AMERICAN: 9 mL/min — AB (ref >90)
Glucose, Bld: 99 mg/dL (ref 70–99)
Phosphorus: 4.3 mg/dL (ref 2.3–4.6)
Potassium: 5.3 mEq/L (ref 3.7–5.3)
Sodium: 139 mEq/L (ref 137–147)

## 2013-08-05 LAB — GLUCOSE, CAPILLARY
Glucose-Capillary: 135 mg/dL — ABNORMAL HIGH (ref 70–99)
Glucose-Capillary: 129 mg/dL — ABNORMAL HIGH (ref 70–99)
Glucose-Capillary: 148 mg/dL — ABNORMAL HIGH (ref 70–99)

## 2013-08-05 LAB — PREPARE RBC (CROSSMATCH)

## 2013-08-05 MED ORDER — SODIUM THIOSULFATE 25 % IV SOLN
25.0000 g | INTRAVENOUS | Status: DC
  Administered 2013-08-07 – 2013-08-14 (×4): 25 g via INTRAVENOUS
  Filled 2013-08-05 (×9): qty 100

## 2013-08-05 MED ORDER — SODIUM CHLORIDE 0.9 % IV SOLN
25.0000 g | INTRAVENOUS | Status: DC
  Filled 2013-08-05: qty 100

## 2013-08-05 NOTE — Progress Notes (Addendum)
Reviewed and in agreement with treatment provided.   Ladona Horns PT, DPT

## 2013-08-05 NOTE — Procedures (Signed)
On hemodialysis without hemodynamic instability.  Hemoglobin has been running low. He is s/p amputation and on rehab.  Hbg now 6.9 grams. I see no advantage to allowing it to stay low and only benefit from PRBCs in this situation.  Will transfuse during dialysis.  Not sure of role of sodium thiosulfate in this circumstance but will defer to ordering physician.   Assessment:  1 Gangrene bilat LE's, s/p bilat BKA Now on rehab  2 ESRD on hemodialysis TTS  3 Calciphylaxis- on Na thio w HD  4 Anemia - hgb 6.9 Aranesp 150 mcg q tues Hd /iron load started, transfusions today  5 MBD cont sevelamer ac  6 Hx afib, not on any Rx  7 DM on insulin  8 Hx HTN- BP stable off of BP meds due to orthostatic hypotension  9 Volume- no vol excess, dry wt will need to be lowered from bilat amp  10. Nutrition -poor synthetic function.

## 2013-08-05 NOTE — Progress Notes (Signed)
Occupational Therapy Session Notes  Patient Details  Name: Paul [name] MRN: [id number] Date of Birth: 1956-04-19  Today's Date: 08/05/2013  Short Term Goals: Week 1:  OT Short Term Goal 1 (Week 1): Patient will perform toilet transfer with minimal assistance using DME OT Short Term Goal 2 (Week 1): Patient will complete LB dressing(pants only) with minimal assistance OT Short Term Goal 3 (Week 1): Patient will be educated on a BUE strengthening HEP in order to increase overall independence with functional transfers  Skilled Therapeutic Interventions/Progress Updates:   Session #1 [phone] - 60 Minutes Individual Therapy Patient with 2/10 in BLEs "calves", RN aware & patient premedicated  Upon entering room, patient found supine in bed. Dr present at beginning of session and recommended gauze and coban > blister on right thumb. Therapist educated patient on application of bandage and wearing schedule. From here, patient engaged in bed mobility and sat EOB with min assist from therapist. Therapist then set-up basin and patient performed UB/LB bathing & dressing while seated EOB. Patient with open wounds > sacrum/buttock, therapist notified RN. Patient takes more than a reasonable amount of time to complete ADL, but does so in a safe and effective manner. After ADL, patient transferred > w/c using slide board with steady/min assist. Patient left seated in w/c with PT present for next session.   Session #2 1350-1445 - 2 Minutes Individual Therapy No complaints of pain Upon entering room, patient found supine in bed with wife present at bedside. Therapist discussed home set-up with wife and patient, wife brought in house measurements and pictures. Patient transferred out of bed > w/c (posterially) then propelled self > tub room for tub/shower transfer onto tub transfer bench with minimal assistance (anterior transfer); do not recommend slide board for tub bench transfers at this time. Patient  then propelled self back to room and therapist measured width of w/c =26.5 inches. Social worker present and all discussed home set-up and discharge planning. Left patient seated in w/c with wife and SW present.   Precautions:  Precautions Precautions: Fall Precaution Booklet Issued: No Restrictions Weight Bearing Restrictions: Yes RLE Weight Bearing: Non weight bearing LLE Weight Bearing: Non weight bearing  See FIM for current functional status  [name] 08/05/2013, 7:22 AM

## 2013-08-05 NOTE — Progress Notes (Signed)
Subjective/Complaints: Did well with therapies. Legs a little sore. NSL fell out  A 12 point review of systems has been performed and if not noted above is otherwise negative.   Objective: Vital Signs: Blood pressure 133/73, pulse 71, temperature 98.3 F (36.8 C), temperature source Oral, resp. rate 18, weight 75.6 kg (166 lb 10.7 oz), SpO2 96.00%. No results found.  Recent Labs  08/03/13 1227  WBC 8.7  HGB 7.1*  HCT 23.4*  PLT 374    Recent Labs  08/03/13 1227  NA 137  K 4.7  CL 95*  GLUCOSE 103*  BUN 58*  CREATININE 6.25*  CALCIUM 8.0*   CBG (last 3)   Recent Labs  08/04/13 1649 08/04/13 2048 08/05/13 0722  GLUCAP 187* 86 135*    Wt Readings from Last 3 Encounters:  08/03/13 75.6 kg (166 lb 10.7 oz)  08/03/13 76.2 kg (167 lb 15.9 oz)  08/03/13 76.2 kg (167 lb 15.9 oz)    Physical Exam:  Constitutional: He is oriented to person, place, and time. Hair cut, clean  HENT: oral mucosa pink and moist.  Head: Normocephalic.  Eyes: EOM are normal.  Neck: Normal range of motion. Neck supple. No thyromegaly present.  Cardiovascular: Normal rate and regular rhythm. No murmurs, rubs, or gallops  Respiratory: Effort normal and breath sounds normal. No respiratory distress. No wheezes, rales, or rhonchi  GI: Soft. Bowel sounds are normal. He exhibits no distension.  Neurological: He is alert and oriented to person, place, and time. No cranial nerve deficit. Coordination normal.  Patient follows full commands. Deltoids 4/5. Biceps, triceps, and hands 4+ to 5/5. LE's HF 3+/5, KE3+ /KF 3+    Skin:  Bilateral BKA sites dressed with immediate post-op coban dressing. 2cm blister right thumb Psychiatric: He has a normal mood and affect. His behavior is normal. Thought content normal   Assessment/Plan: 1. Functional deficits secondary to bilateral BKA's which require 3+ hours per day of interdisciplinary therapy in a comprehensive inpatient rehab  setting. Physiatrist is providing close team supervision and 24 hour management of active medical problems listed below. Physiatrist and rehab team continue to assess barriers to discharge/monitor patient progress toward functional and medical goals. FIM: FIM - Bathing Bathing Steps Patient Completed: Chest;Right Arm;Left Arm;Abdomen Bathing: 3: Mod-Patient completes 5-7 14f 10 parts or 50-74%  FIM - Upper Body Dressing/Undressing Upper body dressing/undressing steps patient completed: Thread/unthread right sleeve of pullover shirt/dresss;Thread/unthread left sleeve of pullover shirt/dress;Put head through opening of pull over shirt/dress;Pull shirt over trunk Upper body dressing/undressing: 5: Set-up assist to: Obtain clothing/put away FIM - Lower Body Dressing/Undressing Lower body dressing/undressing: 0: Activity did not occur  FIM - Toileting Toileting: 0: Activity did not occur  FIM - Air cabin crew Transfers: 0-Activity did not occur  FIM - Control and instrumentation engineer Devices: HOB elevated Bed/Chair Transfer: 4: Supine > Sit: Min A (steadying Pt. > 75%/lift 1 leg);4: Sit > Supine: Min A (steadying pt. > 75%/lift 1 leg);3: Bed > Chair or W/C: Mod A (lift or lower assist);3: Chair or W/C > Bed: Mod A (lift or lower assist) (anterior posterior transfer)  FIM - Locomotion: Wheelchair Locomotion: Wheelchair: 5: Travels 150 ft or more: maneuvers on rugs and over door sills with supervision, cueing or coaxing FIM - Locomotion: Ambulation Locomotion: Ambulation: 0: Activity did not occur (Bilateral BKA)  Comprehension Comprehension Mode: Auditory Comprehension: 7-Follows complex conversation/direction: With no assist  Expression Expression Mode: Verbal Expression: 7-Expresses  complex ideas: With no assist  Social Interaction Social Interaction: 7-Interacts appropriately with others - No medications needed.  Problem Solving Problem Solving: 7-Solves  complex problems: Recognizes & self-corrects  Memory Memory: 7-Complete Independence: No helper  Medical Problem List and Plan:  1. Bilateral BKA secondary to extensive gangrene/calciphylaxis 07/30/2013  2. DVT Prophylaxis/Anticoagulation: Aspirin  3. Pain Management: Neurontin 300 mg 3 times a day, Percocet and Robaxin as needed. Monitor with increased mobility  4. Mood/depression. Zoloft 50 mg daily. Provide emotional support  5. Neuropsych: This patient is capable of making decisions on his own behalf.  6. End-stage renal disease. Continue hemodialysis as per renal services  7. Diabetes mellitus with peripheral neuropathy. Latest hemoglobin A1c 6.7. NovoLog 3 units 3 times a day, Lantus insulin 10 units each bedtime. Fair control at present 8. History of atrial fibrillation. No anticoagulations secondary to suspect Coumadin necrosis. Cardiac rate controlled  9. Acute on chronic anemia. Latest hemoglobin 8.2. Continue Aranesp  10. Hyperlipidemia. Lipitor  11. GERD. Protonix 12. Wound care: remove dressings in 7-10 days  -coban wrap right thumb  LOS (Days) 2 A FACE TO FACE EVALUATION WAS PERFORMED  SWARTZ,ZACHARY T 08/05/2013 7:51 AM

## 2013-08-05 NOTE — Significant Event (Signed)
CRITICAL VALUE ALERT  Critical value received:  Hgb 6.9   Date of notification:  08/06/2015  Time of notification:  1725  Critical value read back:yes  Nurse who received alert:  Lind Covert, RN  MD notified (1st page):  Reesa Chew, PA  Time of first page:  78  MD notified (2nd page):  Time of second page:  Responding MD:  Reesa Chew, PA  Time MD responded:  [phone]

## 2013-08-05 NOTE — Progress Notes (Signed)
Social Work  Social Work Assessment and Plan  Patient Details  Name: Paul [name] MRN: [id number] Date of Birth: Nov 09, 1955  Today's Date: 08/05/2013  Problem List:  Patient Active Problem List   Diagnosis Date Noted  . S/P bilateral BKA (below knee amputation) 08/03/2013  . S/P BKA (below knee amputation) bilateral 07/30/2013  . Open wound of both legs with complication 79/89/2119  . Acute blood loss anemia 07/12/2013  . Syncope 07/09/2013  . Intolerance to CAPD peritoneal dialysis s/p CAPD cath removal 07/09/2013 07/05/2013  . Atherosclerosis of native arteries of the extremities with ulceration(440.23) 06/17/2013  . Gait disorder 05/31/2013  . Weakness 05/24/2013  . Depression 04/03/2013  . GERD (gastroesophageal reflux disease) 04/03/2013  . Atrial fibrillation 04/02/2013  . DM (diabetes mellitus) 12/28/2012  . Other complications due to renal dialysis device, implant, and graft 05/19/2012  . Anemia 07/02/2011  . ESRD on hemodialysis 05/29/2011   Past Medical History:  Past Medical History  Diagnosis Date  . Open wound of both legs with complication     Pt has had progressive wounds of both LE's including gangrene of the toes and patchy necrosis of the calves.  He has been treated at [facility] by Dr Jerline Pain with hyperbaric O2 5d per week.  He is getting Na Thiosulfate with HD for suspected calciphylaxis. Pt says doctor's aren't sure if these ulcers were diabetic ulcers or calciphylaxis.  He underwent bilat BKA on 07/30/13.  He says that the woun  . ESRD on hemodialysis     Pt has ESRD due to DM.  He had a L upper arm AVF prior to starting HD which was ligated due to L arm steal syndrome.  He started HD in Jan 2014 and did home HD.  The family couldn't cannulate the R arm AVF successfully so by mid 2014 they decided to switch to PD which was done late summer 2014.  In Jan 2015 he was admitted with FTT felt to be due to underdialysis on PD and PD was abandoned and he   . Anemia      esrd  . Hypertension     off of meds due to orthostatic hypotension  . Closed left arm fracture 1967  . Heart murmur   . Corneal abrasion, left   . GERD (gastroesophageal reflux disease)   . History of blood transfusion   . Glomerulosclerosis, diabetic   . Depression   . A-fib   . Calciphylaxis 05/2013    lower ext  . Diabetes mellitus     Type 1 iddm x 11 yrs  . Diabetic neuropathy   . Cancer     hx duodenal adenoCa 2012, resected   Past Surgical History:  Past Surgical History  Procedure Laterality Date  . Whipple procedure  2002    duodenal ca, Dr. Harlow Asa  . Bone marrow biopsy  2012  . Renal biopsy, percutaneous  2012  . Tonsillectomy    . Portacath placement  2002  . Biliary diversion, external  2002  . Other surgical history      Cyst removed from back  . Av fistula placement  06/07/2011    Procedure: ARTERIOVENOUS (AV) FISTULA CREATION;  Surgeon: Angelia Mould, MD;  Location: [facility];  Service: Vascular;  Laterality: Left;  . Ligation of arteriovenous  fistula  05/22/2012    Procedure: LIGATION OF ARTERIOVENOUS  FISTULA;  Surgeon: Mal Misty, MD;  Location: [facility];  Service: Vascular;  Laterality: Left;  Left brachio-cephalic  fistula  . Insertion of dialysis catheter  05/22/2012    Procedure: INSERTION OF DIALYSIS CATHETER;  Surgeon: Mal Misty, MD;  Location: [facility];  Service: Vascular;  Laterality: N/A;  right internal jugular vein  . Av fistula placement  06/18/2012    Procedure: ARTERIOVENOUS (AV) FISTULA CREATION;  Surgeon: Mal Misty, MD;  Location: [facility];  Service: Vascular;  Laterality: Right;  [place]  . Colonoscopy      Hx: of  . Capd insertion N/A 01/14/2013    Procedure: LAPAROSCOPIC INSERTION CONTINUOUS AMBULATORY PERITONEAL DIALYSIS  (CAPD) CATHETER;  Surgeon: Adin Hector, MD;  Location: [facility];  Service: General;  Laterality: N/A;  . Vascular surgery    . Removal of a dialysis catheter    . Capd removal N/A 07/09/2013    Procedure:  CONTINUOUS AMBULATORY PERITONEAL DIALYSIS  (CAPD) CATHETER REMOVAL;  Surgeon: Adin Hector, MD;  Location: [facility];  Service: General;  Laterality: N/A;  . Amputation Bilateral 07/30/2013    Procedure: AMPUTATION BELOW KNEE;  Surgeon: Newt Minion, MD;  Location: [facility];  Service: Orthopedics;  Laterality: Bilateral;  Bilateral Below Knee Amputations   Social History:  reports that he has never smoked. He has never used smokeless tobacco. He reports that he does not drink alcohol or use illicit drugs.  Family / Support Systems Marital Status: Married Patient Roles: Spouse;Parent (Has 1 Dtr, 2 sons in [place], [place] and [place] areas.) Spouse/Significant Other: wife, Oskar Cretella @ [phone] or ([phone] Children: 3 adult childre living in [place], [place] and [place] Anticipated Caregiver: wife Ability/Limitations of Caregiver: Wife not working, can assist, no heavy lifting. Caregiver Availability: 24/7 Family Dynamics: wife very supportive and willing to assist as much as she is able . She does leave home frequently as she cares for her 4 y.o. mother and checks on pt's father who is in an ALF  Social History Preferred language: English Religion: Christian Cultural Background: NA Education: college Read: Yes Write: Yes Employment Status: Employed Name of Employer: [employer] Return to Work Plans: Pt still focused on wanting to be able to return to work if at all possible.  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 Legal Hisotry/Current Legal Issues: None Guardian/Conservator: NOne - per MD, pt capable of making decisions on his own behalf   Abuse/Neglect Physical Abuse: Denies Verbal Abuse: Denies Sexual Abuse: Denies Exploitation of patient/patient's resources: Denies Self-Neglect: Denies  Emotional Status Pt's affect, behavior adn adjustment status: Pt very pleasant and direct in his communication.  Denies any  significant emotional distress, however, does state he is "disappointed" in need for BKAs.  Does fault what he feels is some oversight early enough on to have prevented this outcome.  He is realistic that, at the point of medical severity his LE's had reached, "I didn't have much choice in the matter.  It had to be done"   Discussed option of having a peer visit from the local [facility] group folks - pt very agreeable. Recent Psychosocial Issues: chronic health issues for the past 1 1/2 years and start of HD after trial for PD failed.   Pyschiatric History: None Substance Abuse History: None  Patient / Family Perceptions, Expectations & Goals Pt/Family understanding of illness & functional limitations: pt and wife with good understanding of his multiple medical issues and of current functional limitations/ need for CIR Premorbid pt/family roles/activities: was using rolling walker  PTA and wife notes he did suffer some falls because "he was just so weak" Anticipated changes in roles/activities/participation: Pt will now be functioning at a w/c level until he is using prosthesis Pt/family expectations/goals: Pt hopeful he can reach an independent level of function in w/c as wife does need to be able to leave the home several times per day.  Community Resources [employer]: Other (Comment) ([place]) Premorbid Home Care/DME Agencies: None Transportation available at discharge: yes - wife provides transportation to HD Resource referrals recommended: Support group (specify) (Peer visits)  Discharge Planning Living Arrangements: Spouse/significant other Support Systems: Spouse/significant other;Children;Friends/neighbors Type of Residence: Private residence Insurance Resources: Multimedia programmer (specify) Psychologist, counselling) Financial Resources: Employment Museum/gallery curator Screen Referred: No Living Expenses: Higher education careers adviser Management: Patient Does the patient have any problems obtaining your  medications?: No Home Management: pt and wife Patient/Family Preliminary Plans: pt hopes to be able to return home after CIR, however, may need to go SNF for short term if ramp/ other home modifications not completed. Barriers to Discharge: Steps Social Work Anticipated Follow Up Needs: HH/OP;SNF;Support Group Expected length of stay: 8-10 days  Clinical Impression Very pleasant gentleman familiar to CIR from prior stay.  Now with bil BKAs and looking at home mod needs for w/c level mobility.  Wife very supportive, however, does provide supervision to her own 34 yr old mother and to pt's father (in ALF).  May need to consider short term SNF if ramp not in place quickly.  Will follow for support and d/c planning needs.  Leonilda Cozby 08/05/2013, 3:49 PM

## 2013-08-05 NOTE — Progress Notes (Signed)
Physical Therapy Session Note  Patient Details  Name: Paul [name] MRN: [id number] Date of Birth: [birth year]  Today's Date: 08/05/2013 Time: [id number] Time Calculation (min): 31 min  Short Term Goals: Week 1:  PT Short Term Goal 1 (Week 1): Pt will perform bed mobility with supervision PT Short Term Goal 2 (Week 1): Pt will perform bed to w/c transfer with supervision PT Short Term Goal 3 (Week 1): Pt will perform car transfer with Mod A  Skilled Therapeutic Interventions/Progress Updates:    Session consisted of pt performing anterior-posterior transfers with S-Min A (mainly needing S but Min A was needed for stabilization on a couple of occasions) with pt showing improved UE strength and ability to shift his hips without cueing, w/c propulsion from his room to the rehab gym with Mod I (in controlled environment) in order to improve functional independence, bed mobility with S (pt presenting with improved BUE strength throughout), and therex to BLE's including the following exercises: Quad sets, SLR's, and supine hip abd/add with manual resistance in order to improve pt's lower extremity ROM and strength bilaterally.   Therapist blocked w/c from sliding backwards during anterior transfer to bed.  Therapy Documentation Precautions:  Precautions Precautions: Fall Precaution Booklet Issued: No Restrictions Weight Bearing Restrictions: Yes RLE Weight Bearing: Non weight bearing LLE Weight Bearing: Non weight bearing Pain:  Pt had c/o unrated BLE pain when performing anterior-posterior transfers. Pt took short rest breaks when performing this transfer in order to alleviate this pain.   See FIM for current functional status  Therapy/Group: Individual Therapy  Dovid Bartko 08/05/2013, 1:02 PM

## 2013-08-05 NOTE — Progress Notes (Signed)
[facility] Individual Statement of Services  Patient Name:  Paul [name]  Date:  08/05/2013  Welcome to the [facility].  Our goal is to provide you with an individualized program based on your diagnosis and situation, designed to meet your specific needs.  With this comprehensive rehabilitation program, you will be expected to participate in at least 3 hours of rehabilitation therapies Monday-Friday, with modified therapy programming on the weekends.  Your rehabilitation program will include the following services:  Physical Therapy (PT), Occupational Therapy (OT), 24 hour per day rehabilitation nursing, Therapeutic Recreaction (TR), Case Management (Social Worker), Rehabilitation Medicine, Nutrition Services and Pharmacy Services  Weekly team conferences will be held on Tuesdays to discuss your progress.  Your Social Worker will talk with you frequently to get your input and to update you on team discussions.  Team conferences with you and your family in attendance may also be held.  Expected length of stay: 8-10 days  Overall anticipated outcome: modified ind @ w/c level  Depending on your progress and recovery, your program may change. Your Social Worker will coordinate services and will keep you informed of any changes. Your Social Worker's name and contact numbers are listed  below.  The following services may also be recommended but are not provided by the [facility]  Peer Visits   Arrangements will be made to provide these services after discharge if needed.  Arrangements include referral to agencies that provide these services.  Your insurance has been verified to be:  [place] Your primary doctor is:  Dr. Dorthy Cooler  Pertinent information will be shared with your doctor and your insurance  company.  Social Worker:  [facility] [place], [employer] or [phone]   Information discussed with and copy given to patient by: Lennart Pall, 08/05/2013, 3:55 PM

## 2013-08-05 NOTE — Progress Notes (Signed)
Physical Therapy Session Note  Patient Details  Name: Paul [name] MRN: [id number] Date of Birth: [birth year]  Today's Date: 08/05/2013 Time: [id number] Time Calculation (min): 54 min  Short Term Goals: Week 1:  PT Short Term Goal 1 (Week 1): Pt will perform bed mobility with supervision PT Short Term Goal 2 (Week 1): Pt will perform bed to w/c transfer with supervision PT Short Term Goal 3 (Week 1): Pt will perform car transfer with Mod A  Skilled Therapeutic Interventions/Progress Updates:    Session began with providing pt with education including appropriate residual limb positioning (in the bed and in the w/c) , techniques to decrease phantom limb pain, making sure his home is setup safely, appropriate pressure relief schedule while in the bed and in the w/c, and emphasizing the importance of pt being able to obtain bilateral full knee extension in order for him to be fitted for bilateral prosthetic limbs.            Session also consisted of w/c propulsion from the room to the rehab gym with S in order to increase functional independence. slideboard w/c <> mat and w/c<>car (assistance needed for proper slideboard placement and cues for w/c setup). anterior and posterior scooting for improved buttock clearance during transfers and UE strengthening- utilized blocks with handles, lateral leans x 5 to R/L (verbal cueing for proper elbow positioning) in order improve ability to perform bed mobility.   Therapy Documentation Precautions:  Precautions Precautions: Fall Precaution Booklet Issued: No Restrictions Weight Bearing Restrictions: Yes RLE Weight Bearing: Non weight bearing LLE Weight Bearing: Non weight bearing Pain: Pt c/o unrated BLE pain when performing functional mobility. Pt took rest breaks and was repositioned in the w/c at the end of the session in order to help alleviate this pain.  See FIM for current functional status  Therapy/Group: Individual  Therapy  Lovenia Debruler 08/05/2013, 11:55 AM

## 2013-08-06 ENCOUNTER — Inpatient Hospital Stay (HOSPITAL_COMMUNITY): Payer: Managed Care, Other (non HMO)

## 2013-08-06 ENCOUNTER — Inpatient Hospital Stay (HOSPITAL_COMMUNITY): Payer: Managed Care, Other (non HMO) | Admitting: *Deleted

## 2013-08-06 ENCOUNTER — Inpatient Hospital Stay (HOSPITAL_COMMUNITY): Payer: Managed Care, Other (non HMO) | Admitting: Physical Therapy

## 2013-08-06 ENCOUNTER — Ambulatory Visit: Payer: Managed Care, Other (non HMO) | Admitting: Cardiology

## 2013-08-06 ENCOUNTER — Encounter (HOSPITAL_COMMUNITY): Payer: Managed Care, Other (non HMO) | Admitting: Occupational Therapy

## 2013-08-06 LAB — TYPE AND SCREEN
ABO/RH(D): A NEG
Antibody Screen: NEGATIVE
UNIT DIVISION: 0
Unit division: 0

## 2013-08-06 LAB — GLUCOSE, CAPILLARY
GLUCOSE-CAPILLARY: 147 mg/dL — AB (ref 70–99)
Glucose-Capillary: 190 mg/dL — ABNORMAL HIGH (ref 70–99)
Glucose-Capillary: 206 mg/dL — ABNORMAL HIGH (ref 70–99)
Glucose-Capillary: 98 mg/dL (ref 70–99)

## 2013-08-06 MED ORDER — GERHARDT'S BUTT CREAM
TOPICAL_CREAM | Freq: Every day | CUTANEOUS | Status: DC
  Administered 2013-08-07 – 2013-08-08 (×2): 1 via TOPICAL
  Administered 2013-08-09 – 2013-08-14 (×6): via TOPICAL
  Filled 2013-08-06: qty 1

## 2013-08-06 MED ORDER — COLLAGENASE 250 UNIT/GM EX OINT
TOPICAL_OINTMENT | Freq: Every day | CUTANEOUS | Status: DC
  Administered 2013-08-07 – 2013-08-08 (×2): 1 via TOPICAL
  Administered 2013-08-09 – 2013-08-14 (×6): via TOPICAL
  Filled 2013-08-06: qty 30

## 2013-08-06 NOTE — Progress Notes (Signed)
I saw the patient and agree with the above assessment and plan.    Doing well in CIR.  Progressing.  HD tomorrow.

## 2013-08-06 NOTE — Progress Notes (Signed)
Physical Therapy Session Note  Patient Details  Name: ANIV GELL MRN: [id number] Date of Birth: 03-19-1956  Today's Date: 08/06/2013 Time: 1400-1430 Time Calculation (min): 30 min  Short Term Goals: Week 1:  PT Short Term Goal 1 (Week 1): Pt will perform bed mobility with supervision PT Short Term Goal 2 (Week 1): Pt will perform bed to w/c transfer with supervision PT Short Term Goal 3 (Week 1): Pt will perform car transfer with Mod A  Skilled Therapeutic Interventions/Progress Updates:  Tx focused on sliding board transfers in preparation for car transfers.  Performed WC<>mat transfers at varying heights. Educated pt on head-hips relationship and momentum in safe range. Pt responds well to cues, but lacks sufficient UE strength for independence at this time. See FIM for details.      Therapy Documentation Precautions:  Precautions Precautions: Fall Precaution Booklet Issued: No Restrictions Weight Bearing Restrictions: Yes RLE Weight Bearing: Non weight bearing LLE Weight Bearing: Non weight bearing    Pain: c/o some nausea, but no pain at this time.   See FIM for current functional status  Therapy/Group: Individual Therapy Kennieth Rad, PT, DPT  08/06/2013, 2:50 PM

## 2013-08-06 NOTE — Progress Notes (Signed)
Subjective/Complaints: No new problems. Getting a little stronger  A 12 point review of systems has been performed and if not noted above is otherwise negative.   Objective: Vital Signs: Blood pressure 135/68, pulse 77, temperature 98.4 F (36.9 C), temperature source Oral, resp. rate 17, weight 74.7 kg (164 lb 10.9 oz), SpO2 94.00%. No results found.  Recent Labs  08/03/13 1227 08/05/13 1645  WBC 8.7 15.5*  HGB 7.1* 6.9*  HCT 23.4* 21.6*  PLT 374 455*    Recent Labs  08/03/13 1227 08/05/13 1645  NA 137 139  K 4.7 5.3  CL 95* 91*  GLUCOSE 103* 99  BUN 58* 65*  CREATININE 6.25* 6.03*  CALCIUM 8.0* 8.5   CBG (last 3)   Recent Labs  08/05/13 1111 08/05/13 2134 08/06/13 0738  GLUCAP 129* 148* 190*    Wt Readings from Last 3 Encounters:  08/06/13 74.7 kg (164 lb 10.9 oz)  08/03/13 76.2 kg (167 lb 15.9 oz)  08/03/13 76.2 kg (167 lb 15.9 oz)    Physical Exam:  Constitutional: He is oriented to person, place, and time. Hair cut, clean  HENT: oral mucosa pink and moist.  Head: Normocephalic.  Eyes: EOM are normal.  Neck: Normal range of motion. Neck supple. No thyromegaly present.  Cardiovascular: Normal rate and regular rhythm. No murmurs, rubs, or gallops  Respiratory: Effort normal and breath sounds normal. No respiratory distress. No wheezes, rales, or rhonchi  GI: Soft. Bowel sounds are normal. He exhibits no distension.  Neurological: He is alert and oriented to person, place, and time. No cranial nerve deficit. Coordination normal.  Patient follows full commands. Deltoids 4+/5. Biceps, triceps, and hands 4+ to 5/5. LE's HF 3+/5, KE3+ /KF 3+    Skin:  Bilateral BKA sites dressed with immediate post-op coban dressing. 2cm blister right thumb Psychiatric: He has a normal mood and affect. His behavior is normal. Thought content normal   Assessment/Plan: 1. Functional deficits secondary to bilateral BKA's which require 3+ hours per day of  interdisciplinary therapy in a comprehensive inpatient rehab setting. Physiatrist is providing close team supervision and 24 hour management of active medical problems listed below. Physiatrist and rehab team continue to assess barriers to discharge/monitor patient progress toward functional and medical goals. FIM: FIM - Bathing Bathing Steps Patient Completed: Chest;Right Arm;Left Arm;Abdomen;Front perineal area;Buttocks;Right upper leg;Left upper leg Bathing: 5: Supervision: Safety issues/verbal cues  FIM - Upper Body Dressing/Undressing Upper body dressing/undressing steps patient completed: Thread/unthread right sleeve of pullover shirt/dresss;Thread/unthread left sleeve of pullover shirt/dress;Put head through opening of pull over shirt/dress;Pull shirt over trunk Upper body dressing/undressing: 5: Set-up assist to: Obtain clothing/put away FIM - Lower Body Dressing/Undressing Lower body dressing/undressing steps patient completed: Thread/unthread right underwear leg;Thread/unthread left underwear leg;Pull underwear up/down;Thread/unthread right pants leg;Thread/unthread left pants leg;Pull pants up/down Lower body dressing/undressing: 5: Supervision: Safety issues/verbal cues  FIM - Toileting Toileting: 0: Activity did not occur  FIM - Air cabin crew Transfers: 0-Activity did not occur  FIM - Control and instrumentation engineer Devices: Arm rests Bed/Chair Transfer: 5: Supine > Sit: Supervision (verbal cues/safety issues);5: Sit > Supine: Supervision (verbal cues/safety issues);4: Chair or W/C > Bed: Min A (steadying Pt. > 75%)  FIM - Locomotion: Wheelchair Locomotion: Wheelchair: 5: Travels 150 ft or more: maneuvers on rugs and over door sills with supervision, cueing or coaxing FIM - Locomotion: Ambulation Locomotion: Ambulation: 0: Activity did not occur (Bilateral BKA)  Comprehension Comprehension Mode: Auditory  Comprehension: 7-Follows complex  conversation/direction: With no assist  Expression Expression Mode: Verbal Expression: 7-Expresses complex ideas: With no assist  Social Interaction Social Interaction: 7-Interacts appropriately with others - No medications needed.  Problem Solving Problem Solving: 7-Solves complex problems: Recognizes & self-corrects  Memory Memory: 7-Complete Independence: No helper  Medical Problem List and Plan:  1. Bilateral BKA secondary to extensive gangrene/calciphylaxis 07/30/2013  2. DVT Prophylaxis/Anticoagulation: Aspirin  3. Pain Management: Neurontin 300 mg 3 times a day, Percocet and Robaxin as needed. Monitor with increased mobility  4. Mood/depression. Zoloft 50 mg daily. Provide emotional support  5. Neuropsych: This patient is capable of making decisions on his own behalf.  6. End-stage renal disease. Continue hemodialysis as per renal services  7. Diabetes mellitus with peripheral neuropathy. Latest hemoglobin A1c 6.7. NovoLog 3 units 3 times a day, Lantus insulin 10 units each bedtime. Fair control at present 8. History of atrial fibrillation. No anticoagulations secondary to suspect Coumadin necrosis. Cardiac rate controlled  9. Acute on chronic anemia. Latest hemoglobin 8.2. Continue Aranesp  10. Hyperlipidemia. Lipitor  11. GERD. Protonix 12. Wound care: remove dressings in 7-10 days  -coban wrap right thumb in place  -pt may shower 13. Leukocytosis: check cbc with HD. No signs of infection at present  LOS (Days) 3 A FACE TO FACE EVALUATION WAS PERFORMED  SWARTZ,ZACHARY T 08/06/2013 7:47 AM

## 2013-08-06 NOTE — IPOC Note (Signed)
Overall Plan of Care [facility]) Patient Details Name: Paul [name] MRN: [id number] DOB: 1955/12/04  Admitting Diagnosis: B BKA  Hospital Problems: Active Problems:   S/P bilateral BKA (below knee amputation)     Functional Problem List: Nursing Skin Integrity;Edema;Pain  PT Balance;Edema;Endurance;Pain;Safety;Sensory;Skin Integrity  OT Balance;Endurance;Pain;Safety;Sensory;Skin Integrity  SLP    TR         Basic ADL's: OT Grooming;Bathing;Dressing;Toileting     Advanced  ADL's: OT  (n/a at this time)     Transfers: PT Bed Mobility;Bed to Chair;Car;Furniture  OT Toilet;Tub/Shower     Locomotion: Advertising account planner;Ambulation;Stairs     Additional Impairments: OT None (strengthening > BUEs)  SLP        TR      Anticipated Outcomes Item Anticipated Outcome  Self Feeding independent (current level)  Swallowing      Basic self-care  mod I  Toileting  mod I   Bathroom Transfers supervision  Bowel/Bladder  Continent of bowel and bladder  Transfers  Mod I basic transfers; Min A car transfer  Locomotion  Mod I w/c mobility  Communication     Cognition     Pain  </=3  Safety/Judgment  No falls with injury   Therapy Plan: PT Intensity: Minimum of 1-2 x/day ,45 to 90 minutes PT Frequency: 5 out of 7 days PT Duration Estimated Length of Stay: 10 days OT Intensity: Minimum of 1-2 x/day, 45 to 90 minutes OT Frequency: 5 out of 7 days OT Duration/Estimated Length of Stay: ~10 days         Team Interventions: Nursing Interventions Patient/Family Education;Pain Management;Skin Care/Wound Management;Psychosocial Support;Bowel Management  PT interventions Balance/vestibular training;Community reintegration;Discharge planning;Disease management/prevention;DME/adaptive equipment instruction;Functional mobility training;Neuromuscular re-education;Pain management;Patient/family education;Psychosocial support;Skin care/wound  management;Splinting/orthotics;Therapeutic Activities;Therapeutic Exercise;UE/LE Strength taining/ROM;UE/LE Coordination activities;Wheelchair propulsion/positioning;Stair training  OT Interventions Balance/vestibular training;Community reintegration;Discharge planning;DME/adaptive equipment instruction;Pain management;Patient/family education;Psychosocial support;Self Care/advanced ADL retraining;Skin care/wound managment;Therapeutic Activities;Therapeutic Exercise;UE/LE Strength taining/ROM;Wheelchair propulsion/positioning;UE/LE Coordination activities  SLP Interventions    TR Interventions    SW/CM Interventions Discharge Planning;Psychosocial Support;Patient/Family Education    Team Discharge Planning: Destination: PT-Home ,OT- Home , SLP-  Projected Follow-up: PT-Home health PT, OT-  Home health OT, SLP-  Projected Equipment Needs: PT-Wheelchair (measurements);Wheelchair cushion (measurements);Sliding board (J2 gel cushion and bilateral amputee pads), OT- Tub/shower bench;3 in 1 bedside comode (drop arm BSC), SLP-  Equipment Details: PT-also recommend pt install a ramp for safe entrance into home, OT-  Patient/family involved in discharge planning: PT- Patient,  OT-Patient, SLP-   MD ELOS: 10 days Medical Rehab Prognosis:  Excellent Assessment: The patient has been admitted for CIR therapies. The team will be addressing, functional mobility, strength, stamina, balance, safety, adaptive techniques/equipment, self-care, bowel and bladder mgt, patient and caregiver education, pre-prosthetic education, pain mgt, wound care, skin care, ego-support. Goals have been set at mod I for most w/c based mobility and transfers.    Meredith Staggers, MD, FAAPMR      See Team Conference Notes for weekly updates to the plan of care

## 2013-08-06 NOTE — Progress Notes (Signed)
Occupational Therapy Note  Patient Details  Name: Paul [name] MRN: [id number] Date of Birth: Jun 08, 1955 Today's Date: 08/06/2013  Time:  [id number]  (45 min) Pain:  2/10  BLE Individual session  Pt. Sitting in wc upon OT arrival.  Propelled self to gym.  Adressed UE exercises with free weight, bar.  Used 2 # for free and 4# for bar.  Pt. Very weak in BUE with right weaker than left.  Wife present.  Pt. Did some exercises with no weights due to weakness.  Pt propelled self back to room.  Provided therapy schedule  For week end.     Lisa Roca 08/06/2013, 5:32 PM

## 2013-08-06 NOTE — Progress Notes (Signed)
Occupational Therapy Session Note  Patient Details  Name: Paul [name] MRN: [id number] Date of Birth: Aug 24, 1955  Today's Date: 08/06/2013 Time: 1000-1100 Time Calculation (min): 60 min  Short Term Goals: Week 1:  OT Short Term Goal 1 (Week 1): Patient will perform toilet transfer with minimal assistance using DME OT Short Term Goal 2 (Week 1): Patient will complete LB dressing(pants only) with minimal assistance OT Short Term Goal 3 (Week 1): Patient will be educated on a BUE strengthening HEP in order to increase overall independence with functional transfers  Skilled Therapeutic Interventions/Progress Updates:  Upon entering room, patient found supine in bed with complaints of some nausea and feeling "wiped out". Patient engaged in bed mobility with close supervision, using bed rails. Patient then sat EOB for UB/LB bathing & dressing tasks, performing lateral leans for peri care and to pull pants up to waist. From here, patient transferred EOB>w/c (posteriorally). Patient then completed grooming tasks of brushing teeth while seated at sink in w/c. Therapist assisted with wrapping right thumb secondary to blister. Therapist administered orange theraband and timer (for boosting). At end of session, left patient seated in w/c with all needed items within reach and timer set for 20 minutes to remind patient to perform boosting technique in w/c. DR stated it is okay for patient to shower, but due to fatigue patient did not shower today.   Precautions:  Precautions Precautions: Fall Precaution Booklet Issued: No Restrictions Weight Bearing Restrictions: Yes RLE Weight Bearing: Non weight bearing LLE Weight Bearing: Non weight bearing  See FIM for current functional status  Therapy/Group: Individual Therapy  Chrisa Hassan 08/06/2013, 11:10 AM

## 2013-08-06 NOTE — Progress Notes (Signed)
Physical Therapy Session Note  Patient Details  Name: Paul [name] MRN: [id number] Date of Birth: June 30, 1955  Today's Date: 08/06/2013 Time: [id number] Time Calculation (min): 58 min  Short Term Goals: Week 1:  PT Short Term Goal 1 (Week 1): Pt will perform bed mobility with supervision PT Short Term Goal 2 (Week 1): Pt will perform bed to w/c transfer with supervision PT Short Term Goal 3 (Week 1): Pt will perform car transfer with Mod A  Skilled Therapeutic Interventions/Progress Updates:    Session consisted of pt performing a-p transfers w/c<>mat and w/c<>bed with S-Min A (Min A for bed transfer to guide bottom because of gap between w/c and bed), w/c mobility with S (in controlled environment) to improve functional independence, and exercises focused on improving core strength including: crunches with wedge behind back (with BUE assistance), sitting on the dynadisc (no UE support), catching a ball with oblique twists (both on mat and sitting on balance disc), and sitting on dynadisc and reaching outside his BOS for horseshoes. Multiple rest breaks needed to be taken during the session due to nausea and vomiting.   Therapy Documentation Precautions:  Precautions Precautions: Fall Precaution Booklet Issued: No Restrictions Weight Bearing Restrictions: Yes RLE Weight Bearing: Non weight bearing LLE Weight Bearing: Non weight bearing Pain: Pt c/o unrated BLE pain and nausea during the session. Rest breaks were taken and pt was medicated for pain by the RN after the session was completed order to alleviate this pain.  See FIM for current functional status  Therapy/Group: Individual Therapy  Clayton,William 08/06/2013, 9:35 AM

## 2013-08-06 NOTE — Progress Notes (Signed)
Subjective:  Tolerated hd yesterday, "doing well with PT" Objective Vital signs in last 24 hours: Filed Vitals:   08/05/13 2056 08/05/13 2112 08/05/13 2130 08/06/13 0519  BP: 143/77 145/80 137/88 135/68  Pulse: 84 82 99 77  Temp: 98 F (36.7 C) 98.3 F (36.8 C)  98.4 F (36.9 C)  TempSrc: Oral Oral  Oral  Resp: 16 16 18 17   Weight:  74 kg (163 lb 2.3 oz)  74.7 kg (164 lb 10.9 oz)  SpO2: 98%  100% 94%  Exam: alert, nad , appropriate  LUNGS= CTA bilat  CARD=RRR soft SEM no RG  Abd =soft, nt, nd,  EXT= Bilat BKA w dressings in place, no LE edema   DIALYSIS ACCESS=R FA fistula pos bruit  Dialysis: T[facility]  4h 81.5kg 2/2.5 Bath RFA AVF Heparin none (due to leg wounds - he has clotted system 2x recentl) > resuming tight hep x 2 then advance as needed  Epo 18000 Na thiosulfate 25gm tiw   Assessment:  1 Gangrene bilat LE's,  With Calciphylaxis- s/p bilat BKA Now on rehab  2 ESRD on hemodialysis TTS  3 Calciphylaxis- on Na thio w HD / as dw  Dr. Joelyn Oms continue until Surgeon reports pod op BKA wounds healing well 4 Anemia - hgb 7.1>6.9 yesterday pre hd and transfused 2 u prbcs on hd on  Aranesp 150 mcg q tues Hd /iron load started  5 MBD cont sevelamer ac / ca and phos stable 6 Hx afib, not on any Rx  7 DM on insulin  8 Hx HTN- BP stable off of BP meds due to orthostatic hypotension  9 Volume- no vol excess, dry wt will need to be lowered from bilat amp  10. Nutrition - Alb 1.4>1,6 With bka/ esrd / prior PD pt/ On supplement and renal carb mod diet / renal vit  Labs: Basic Metabolic Panel:  Recent Labs Lab 07/30/13 1402 08/03/13 1227 08/05/13 1645  NA 141 137 139  K 4.3 4.7 5.3  CL 101 95* 91*  CO2 25 25 19   GLUCOSE 87 103* 99  BUN 25* 58* 65*  CREATININE 4.06* 6.25* 6.03*  CALCIUM 7.8* 8.0* 8.5  PHOS 3.3 4.2 4.3   Liver Function Tests:  Recent Labs Lab 07/30/13 1402 08/03/13 1227 08/05/13 1645  ALBUMIN 1.3* 1.4* 1.6*  CBC:  Recent Labs Lab 07/30/13 1402  08/02/13 0514 08/03/13 1227 08/05/13 1645  WBC 14.4* 14.9* 8.7 15.5*  HGB 7.2* 8.2* 7.1* 6.9*  HCT 23.8* 27.5* 23.4* 21.6*  MCV 93.3 91.7 90.0 87.8  PLT 298 424* 374 455*  CBG:  Recent Labs Lab 08/05/13 0722 08/05/13 1111 08/05/13 2134 08/06/13 0738 08/06/13 1135  GLUCAP 135* 129* 148* 190* 147*   Studies/Results: No results found. Medications:   . antiseptic oral rinse  15 mL Mouth Rinse BID  . aspirin EC  325 mg Oral Daily  . atorvastatin  10 mg Oral q1800  . collagenase   Topical Daily  . [START ON 08/10/2013] darbepoetin (ARANESP) injection - DIALYSIS  150 mcg Intravenous Q Tue-HD  . feeding supplement (NEPRO CARB STEADY)  237 mL Oral BID BM  . ferric gluconate (FERRLECIT/NULECIT) IV  125 mg Intravenous Q T,Th,Sa-HD  . gabapentin  300 mg Oral TID  . Gerhardt's butt cream   Topical Daily  . insulin aspart  0-15 Units Subcutaneous TID WC  . insulin aspart  3 Units Subcutaneous TID WC  . insulin glargine  10 Units Subcutaneous QHS  . multivitamin  1 tablet Oral QHS  . pantoprazole  40 mg Oral Daily  . sertraline  50 mg Oral Daily  . sevelamer carbonate  1,600 mg Oral TID WC  . sodium thiosulfate (25%) in normal saline (150ml) infusion (for calciphylaxis)  25 g Intravenous Q T,Th,Sa-HD    Ernest Haber, PA-C [place] [phone] 08/06/2013,1:09 PM  LOS: 3 days

## 2013-08-07 ENCOUNTER — Encounter (HOSPITAL_COMMUNITY): Payer: Managed Care, Other (non HMO)

## 2013-08-07 LAB — RENAL FUNCTION PANEL
Albumin: 1.6 g/dL — ABNORMAL LOW (ref 3.5–5.2)
BUN: 71 mg/dL — AB (ref 6–23)
CHLORIDE: 95 meq/L — AB (ref 96–112)
CO2: 24 mEq/L (ref 19–32)
CREATININE: 5.31 mg/dL — AB (ref 0.50–1.35)
Calcium: 8.8 mg/dL (ref 8.4–10.5)
GFR calc Af Amer: 12 mL/min — ABNORMAL LOW (ref >90)
GFR calc non Af Amer: 11 mL/min — ABNORMAL LOW (ref >90)
GLUCOSE: 87 mg/dL (ref 70–99)
Phosphorus: 2.9 mg/dL (ref 2.3–4.6)
Potassium: 4.9 mEq/L (ref 3.7–5.3)
Sodium: 137 mEq/L (ref 137–147)

## 2013-08-07 LAB — GLUCOSE, CAPILLARY
Glucose-Capillary: 129 mg/dL — ABNORMAL HIGH (ref 70–99)
Glucose-Capillary: 128 mg/dL — ABNORMAL HIGH (ref 70–99)
Glucose-Capillary: 103 mg/dL — ABNORMAL HIGH (ref 70–99)

## 2013-08-07 LAB — CBC
HEMATOCRIT: 26.4 % — AB (ref 39.0–52.0)
HEMOGLOBIN: 8.4 g/dL — AB (ref 13.0–17.0)
MCH: 28.3 pg (ref 26.0–34.0)
MCHC: 31.8 g/dL (ref 30.0–36.0)
MCV: 88.9 fL (ref 78.0–100.0)
Platelets: 479 10*3/uL — ABNORMAL HIGH (ref 150–400)
RBC: 2.97 MIL/uL — AB (ref 4.22–5.81)
RDW: 17.3 % — ABNORMAL HIGH (ref 11.5–15.5)
WBC: 13.8 10*3/uL — AB (ref 4.0–10.5)

## 2013-08-07 NOTE — Progress Notes (Signed)
Subjective/Complaints: Had some nausea early yesterday which seems to have resolved. Reports similar nausea at home. A 12 point review of systems has been performed and if not noted above is otherwise negative.   Objective: Vital Signs: Blood pressure 130/61, pulse 67, temperature 98.4 F (36.9 C), temperature source Oral, resp. rate 17, weight 74.7 kg (164 lb 10.9 oz), SpO2 97.00%. No results found.  Recent Labs  08/05/13 1645  WBC 15.5*  HGB 6.9*  HCT 21.6*  PLT 455*    Recent Labs  08/05/13 1645  NA 139  K 5.3  CL 91*  GLUCOSE 99  BUN 65*  CREATININE 6.03*  CALCIUM 8.5   CBG (last 3)   Recent Labs  08/06/13 1655 08/06/13 2104 08/07/13 0732  GLUCAP 206* 98 129*    Wt Readings from Last 3 Encounters:  08/06/13 74.7 kg (164 lb 10.9 oz)  08/03/13 76.2 kg (167 lb 15.9 oz)  08/03/13 76.2 kg (167 lb 15.9 oz)    Physical Exam:  Constitutional: He is oriented to person, place, and time. Hair cut, clean  HENT: oral mucosa pink and moist.  Head: Normocephalic.  Eyes: EOM are normal.  Neck: Normal range of motion. Neck supple. No thyromegaly present.  Cardiovascular: Normal rate and regular rhythm. No murmurs, rubs, or gallops  Respiratory: Effort normal and breath sounds normal. No respiratory distress. No wheezes, rales, or rhonchi  GI: Soft. Bowel sounds are normal. He exhibits no distension.  Neurological: He is alert and oriented to person, place, and time. No cranial nerve deficit. Coordination normal.  Patient follows full commands. Deltoids 4+/5. Biceps, triceps, and hands 4+ to 5/5. LE's HF 3+/5, KE3+ /KF 3+    Skin:  Bilateral BKA sites dressed with immediate post-op coban dressing. 2cm blister right thumb Psychiatric: He has a normal mood and affect. His behavior is normal. Thought content normal   Assessment/Plan: 1. Functional deficits secondary to bilateral BKA's which require 3+ hours per day of interdisciplinary therapy in a  comprehensive inpatient rehab setting. Physiatrist is providing close team supervision and 24 hour management of active medical problems listed below. Physiatrist and rehab team continue to assess barriers to discharge/monitor patient progress toward functional and medical goals.  In shower today.   FIM: FIM - Bathing Bathing Steps Patient Completed: Chest;Right Arm;Left Arm;Abdomen;Front perineal area;Buttocks;Right upper leg;Left upper leg Bathing: 5: Supervision: Safety issues/verbal cues  FIM - Upper Body Dressing/Undressing Upper body dressing/undressing steps patient completed: Thread/unthread right sleeve of pullover shirt/dresss;Thread/unthread left sleeve of pullover shirt/dress;Put head through opening of pull over shirt/dress;Pull shirt over trunk Upper body dressing/undressing: 5: Set-up assist to: Obtain clothing/put away FIM - Lower Body Dressing/Undressing Lower body dressing/undressing steps patient completed: Thread/unthread right underwear leg;Thread/unthread left underwear leg;Pull underwear up/down;Thread/unthread right pants leg;Thread/unthread left pants leg;Pull pants up/down Lower body dressing/undressing: 5: Supervision: Safety issues/verbal cues  FIM - Toileting Toileting: 0: Activity did not occur  FIM - Air cabin crew Transfers: 0-Activity did not occur  FIM - Control and instrumentation engineer Devices: Arm rests;Sliding board Bed/Chair Transfer: 3: Bed > Chair or W/C: Mod A (lift or lower assist);4: Chair or W/C > Bed: Min A (steadying Pt. > 75%)  FIM - Locomotion: Wheelchair Locomotion: Wheelchair: 2: Travels 44 - 149 ft with supervision, cueing or coaxing FIM - Locomotion: Ambulation Locomotion: Ambulation: 0: Activity did not occur  Comprehension Comprehension Mode: Auditory Comprehension: 7-Follows complex conversation/direction: With no assist  Expression Expression Mode: Verbal Expression:  7-Expresses complex ideas: With  no assist  Social Interaction Social Interaction: 7-Interacts appropriately with others - No medications needed.  Problem Solving Problem Solving: 7-Solves complex problems: Recognizes & self-corrects  Memory Memory: 7-Complete Independence: No helper  Medical Problem List and Plan:  1. Bilateral BKA secondary to extensive gangrene/calciphylaxis 07/30/2013  2. DVT Prophylaxis/Anticoagulation: Aspirin  3. Pain Management: Neurontin 300 mg 3 times a day, Percocet and Robaxin as needed. Monitor with increased mobility  4. Mood/depression. Zoloft 50 mg daily. Provide emotional support  5. Neuropsych: This patient is capable of making decisions on his own behalf.  6. End-stage renal disease. Continue hemodialysis as per renal services  7. Diabetes mellitus with peripheral neuropathy. Latest hemoglobin A1c 6.7. NovoLog 3 units 3 times a day, Lantus insulin 10 units each bedtime. Fair control at present 8. History of atrial fibrillation. No anticoagulations secondary to suspect Coumadin necrosis. Cardiac rate controlled  9. Acute on chronic anemia. Latest hemoglobin 8.2. Continue Aranesp  10. Hyperlipidemia. Lipitor  11. GERD. Protonix 12. Wound care: remove dressings in 7-10 days  -coban wrap right thumb in place  -pt may shower 13. Leukocytosis: check cbc with HD. No signs of infection at present  LOS (Days) 4 A FACE TO FACE EVALUATION WAS PERFORMED  Chasmine Lender T 08/07/2013 8:11 AM

## 2013-08-07 NOTE — Progress Notes (Signed)
Physical Therapy Session Note  Patient Details  Name: Paul [name] MRN: [id number] Date of Birth: March 05, 1956  Today's Date: 08/07/2013 Time: 1300-1400 Time Calculation (min): 60 min  Short Term Goals: Week 1:  PT Short Term Goal 1 (Week 1): Pt will perform bed mobility with supervision PT Short Term Goal 2 (Week 1): Pt will perform bed to w/c transfer with supervision PT Short Term Goal 3 (Week 1): Pt will perform car transfer with Mod A  Skilled Therapeutic Interventions/Progress Updates:   Pt able to completed ant/post transfer from w/c to mat table at supervision level with min verbal cues for safety/set up. Pt able to remove w/c leg rests independently without assist from PT and align w/c properly with mat table to complete transfer safely. Pt completed there ex in supine, sidelying and prone, including quad sets, glut sets, SAQ, SLR and hip abd 1x20. In sidelying hip abd, flexion and ext 1x20 and in prone hip ext and push ups 1x20. Pt instructed to to participate in lying in prone daily for stretching of hip flexor muscles. Pt completed core muscles exercises including ball toss with twists to each side and reaching, including crossing midline while maintain BOS. Pt demonstrates good balance and control in long sitting on mat table. Pt returned to chair and his wife assisted patient with returned to room. Pt reports nsg is able to assist pt to bed as he is to go to dialysis upon return to room.  Therapy Documentation Precautions:  Precautions Precautions: Fall Precaution Booklet Issued: No Restrictions Weight Bearing Restrictions: Yes RLE Weight Bearing: Non weight bearing LLE Weight Bearing: Non weight bearing General:   Vital Signs: Therapy Vitals Temp: 98.5 F (36.9 C) Temp src: Oral Pulse Rate: 65 Resp: 18 BP: 131/74 mmHg Patient Position, if appropriate: Lying Oxygen Therapy SpO2: 99 % O2 Device: None (Room air) Pain: Pain Assessment Pain Assessment: No/denies  pain Pain Score: 0-No pain Pain Type: Surgical pain Pain Location: Leg Pain Orientation: Right;Left   See FIM for current functional status  Therapy/Group: Group Therapy  Lessie Manigo R 08/07/2013, 4:41 PM

## 2013-08-08 ENCOUNTER — Inpatient Hospital Stay (HOSPITAL_COMMUNITY): Payer: Managed Care, Other (non HMO) | Admitting: *Deleted

## 2013-08-08 ENCOUNTER — Inpatient Hospital Stay (HOSPITAL_COMMUNITY): Payer: Managed Care, Other (non HMO) | Admitting: Physical Therapy

## 2013-08-08 LAB — GLUCOSE, CAPILLARY
GLUCOSE-CAPILLARY: 101 mg/dL — AB (ref 70–99)
GLUCOSE-CAPILLARY: 216 mg/dL — AB (ref 70–99)
GLUCOSE-CAPILLARY: 99 mg/dL (ref 70–99)
Glucose-Capillary: 47 mg/dL — ABNORMAL LOW (ref 70–99)
Glucose-Capillary: 83 mg/dL (ref 70–99)

## 2013-08-08 NOTE — Progress Notes (Signed)
Occupational Therapy Note Patient Details  Name: LYNK LABERGE MRN: [id number] Date of Birth: 1955/09/07 Today's Date: 08/08/2013  *Time:  [id number]  (60 min)  1st session Pain  none: Individual session Pt. In bed.  Came from supine to sit with bed rails and SBA.  Did lateral transfer from bed to wc using sliding board.  Propelled wc to shower seat.  Did lateral transfer to tub bench with OT stabilizing seats to keep from sliding.  Pt. Completed shower with no assistaqnce.  Completed dressing while seated on shower bench and with OT providing set up.  Rolled to sink and finished grooming.   Left in room with call bell in reach.    Time:  1300-1345  (45 min) Pain:  2/10 BLE Individual session   Addressed BUE therapeutic activity using the sci fit and free weights.  Pt. Did sci fit for 5 min. Clockwise at 4 wkld and then counterclockwise for 5 min.  Did  2 # weight on shoulder for all planes and 4 # on bicep curls.  Used 4 # bar for sho.presses and overhead press.  Pt. Tolerated session sell.  Propelled self back to room with wife present.      Lisa Roca 08/08/2013, 8:08 AM

## 2013-08-08 NOTE — Progress Notes (Signed)
Physical Therapy Note  Patient Details  Name: Paul [name] MRN: [id number] Date of Birth: [birth year] Today's Date: 08/08/2013  1100-1145 (45 minutes) individual Pain: no c/o pain/ premedicated Focus of treatment: therapeutic exercise focused on strengthening bilateral UEs / trunk to improve transfers Treatment: Pt in bed upon arrival; transfers SBA using sliding board including setup bed to wc; wc mobility mod independent on unit > 150 feet level surfaces; Paragym bilateral UE strengthening- shoulder depression 2 X 10 (15#) , scapular squeezes 2 X 10 (15#), bicep curls 2 X 10 (5#), elbow extension 2 X 10 (5#); anterior-posterior transfer wc <> mat SBA including setup; trunk diagonals sitting on mat using yellow weighted ball 2 X 10; returned to room with all needs within reach; wife present.    1430-1455 (25 minutes) individual Pain: no reported pain Focus of treatment: wc mobility for activity tolerance; car transfer training Treatment: Pt wishes to attempt wc to [place] transfer ; wc mobility - pt propels wc > 500 feet on level and slight inclines modified independent; wc to van- angle of sliding board to great to attempt safely; recommended to wife to borrow car (from mother) to practice this week before DC.   Adonis Ryther,JIM 08/08/2013, 11:43 AM

## 2013-08-08 NOTE — Progress Notes (Signed)
Subjective/Complaints: No new problems other than being a little "bored". Had a good night.. A 12 point review of systems has been performed and if not noted above is otherwise negative.   Objective: Vital Signs: Blood pressure 120/66, pulse 71, temperature 98.3 F (36.8 C), temperature source Oral, resp. rate 18, weight 73.9 kg (162 lb 14.7 oz), SpO2 100.00%. No results found.  Recent Labs  08/05/13 1645 08/07/13 1642  WBC 15.5* 13.8*  HGB 6.9* 8.4*  HCT 21.6* 26.4*  PLT 455* 479*    Recent Labs  08/05/13 1645 08/07/13 1642  NA 139 137  K 5.3 4.9  CL 91* 95*  GLUCOSE 99 87  BUN 65* 71*  CREATININE 6.03* 5.31*  CALCIUM 8.5 8.8   CBG (last 3)   Recent Labs  08/07/13 1154 08/07/13 2146 08/08/13 0725  GLUCAP 128* 103* 101*    Wt Readings from Last 3 Encounters:  08/07/13 73.9 kg (162 lb 14.7 oz)  08/03/13 76.2 kg (167 lb 15.9 oz)  08/03/13 76.2 kg (167 lb 15.9 oz)    Physical Exam:  Constitutional: He is oriented to person, place, and time. Hair cut, clean  HENT: oral mucosa pink and moist.  Head: Normocephalic.  Eyes: EOM are normal.  Neck: Normal range of motion. Neck supple. No thyromegaly present.  Cardiovascular: Normal rate and regular rhythm. No murmurs, rubs, or gallops  Respiratory: Effort normal and breath sounds normal. No respiratory distress. No wheezes, rales, or rhonchi  GI: Soft. Bowel sounds are normal. He exhibits no distension.  Neurological: He is alert and oriented to person, place, and time. No cranial nerve deficit. Coordination normal.  Patient follows full commands. Deltoids 4+/5. Biceps, triceps, and hands 4+ to 5/5. LE's HF 3+/5, KE3+ /KF 3+    Skin:  Bilateral BKA sites dressed with immediate post-op coban dressing. 2cm blister right thumb Psychiatric: He has a normal mood and affect. His behavior is normal. Thought content normal   Assessment/Plan: 1. Functional deficits secondary to bilateral BKA's which require  3+ hours per day of interdisciplinary therapy in a comprehensive inpatient rehab setting. Physiatrist is providing close team supervision and 24 hour management of active medical problems listed below. Physiatrist and rehab team continue to assess barriers to discharge/monitor patient progress toward functional and medical goals.      FIM: FIM - Bathing Bathing Steps Patient Completed: Chest;Right Arm;Left Arm;Abdomen;Front perineal area;Buttocks;Right upper leg;Left upper leg Bathing: 5: Supervision: Safety issues/verbal cues  FIM - Upper Body Dressing/Undressing Upper body dressing/undressing steps patient completed: Thread/unthread right sleeve of pullover shirt/dresss;Thread/unthread left sleeve of pullover shirt/dress;Put head through opening of pull over shirt/dress;Pull shirt over trunk Upper body dressing/undressing: 5: Set-up assist to: Obtain clothing/put away FIM - Lower Body Dressing/Undressing Lower body dressing/undressing steps patient completed: Thread/unthread right pants leg;Thread/unthread left pants leg;Pull pants up/down Lower body dressing/undressing: 5: Supervision: Safety issues/verbal cues  FIM - Toileting Toileting: 0: Activity did not occur  FIM - Air cabin crew Transfers: 0-Activity did not occur  FIM - Control and instrumentation engineer Devices: Arm rests;Sliding board Bed/Chair Transfer: 5: Supine > Sit: Supervision (verbal cues/safety issues);5: Sit > Supine: Supervision (verbal cues/safety issues);5: Bed > Chair or W/C: Supervision (verbal cues/safety issues);5: Chair or W/C > Bed: Supervision (verbal cues/safety issues)  FIM - Locomotion: Wheelchair Locomotion: Wheelchair: 0: Activity did not occur FIM - Locomotion: Ambulation Locomotion: Ambulation: 0: Activity did not occur  Comprehension Comprehension Mode: Auditory Comprehension: 7-Follows complex conversation/direction: With  no assist  Expression Expression Mode:  Verbal Expression: 7-Expresses complex ideas: With no assist  Social Interaction Social Interaction: 7-Interacts appropriately with others - No medications needed.  Problem Solving Problem Solving: 7-Solves complex problems: Recognizes & self-corrects  Memory Memory: 7-Complete Independence: No helper  Medical Problem List and Plan:  1. Bilateral BKA secondary to extensive gangrene/calciphylaxis 07/30/2013  2. DVT Prophylaxis/Anticoagulation: Aspirin  3. Pain Management: Neurontin 300 mg 3 times a day, Percocet and Robaxin as needed. Monitor with increased mobility  4. Mood/depression. Zoloft 50 mg daily. Provide emotional support  5. Neuropsych: This patient is capable of making decisions on his own behalf.  6. End-stage renal disease. Continue hemodialysis as per renal services  7. Diabetes mellitus with peripheral neuropathy. Latest hemoglobin A1c 6.7. NovoLog 3 units 3 times a day, Lantus insulin 10 units each bedtime. good control at present 8. History of atrial fibrillation. No anticoagulations secondary to suspect Coumadin necrosis. Cardiac rate controlled  9. Acute on chronic anemia. Latest hemoglobin 8.2. Continue Aranesp  10. Hyperlipidemia. Lipitor  11. GERD. Protonix 12. Wound care: remove dressings in 7-10 days  -coban wrap right thumb in place--recheck thumb tomorrow  -pt may shower 13. Leukocytosis: 13.8 . No signs of infection at present  LOS (Days) 5 A FACE TO FACE EVALUATION WAS PERFORMED  SWARTZ,ZACHARY T 08/08/2013 8:16 AM

## 2013-08-08 NOTE — Progress Notes (Signed)
Hypoglycemic Event  CBG: 47   Treatment: 15 GM carbohydrate snack  Symptoms: Vision changes  Follow-up CBG: Time:1052 CBG Result:83  Possible Reasons for Event: Unknown  Comments/MD notified:YES, DR. SWARTZ. MONITOR    Lorenz Donley, Buyer, [employer]  Remember to initiate Hypoglycemia Order Set & complete

## 2013-08-09 ENCOUNTER — Encounter (HOSPITAL_COMMUNITY): Payer: Managed Care, Other (non HMO) | Admitting: Occupational Therapy

## 2013-08-09 ENCOUNTER — Inpatient Hospital Stay (HOSPITAL_COMMUNITY): Payer: Managed Care, Other (non HMO)

## 2013-08-09 ENCOUNTER — Ambulatory Visit: Payer: Managed Care, Other (non HMO) | Admitting: Physical Therapy

## 2013-08-09 ENCOUNTER — Inpatient Hospital Stay (HOSPITAL_COMMUNITY): Payer: Managed Care, Other (non HMO) | Admitting: Occupational Therapy

## 2013-08-09 DIAGNOSIS — D649 Anemia, unspecified: Secondary | ICD-10-CM

## 2013-08-09 DIAGNOSIS — R1312 Dysphagia, oropharyngeal phase: Secondary | ICD-10-CM

## 2013-08-09 DIAGNOSIS — E1142 Type 2 diabetes mellitus with diabetic polyneuropathy: Secondary | ICD-10-CM

## 2013-08-09 DIAGNOSIS — E1149 Type 2 diabetes mellitus with other diabetic neurological complication: Secondary | ICD-10-CM

## 2013-08-09 DIAGNOSIS — N186 End stage renal disease: Secondary | ICD-10-CM

## 2013-08-09 DIAGNOSIS — I70269 Atherosclerosis of native arteries of extremities with gangrene, unspecified extremity: Secondary | ICD-10-CM

## 2013-08-09 DIAGNOSIS — S88119A Complete traumatic amputation at level between knee and ankle, unspecified lower leg, initial encounter: Secondary | ICD-10-CM

## 2013-08-09 LAB — GLUCOSE, CAPILLARY
GLUCOSE-CAPILLARY: 75 mg/dL (ref 70–99)
GLUCOSE-CAPILLARY: 257 mg/dL — AB (ref 70–99)
GLUCOSE-CAPILLARY: 120 mg/dL — AB (ref 70–99)
Glucose-Capillary: 146 mg/dL — ABNORMAL HIGH (ref 70–99)

## 2013-08-09 MED ORDER — SEVELAMER CARBONATE 800 MG PO TABS
800.0000 mg | ORAL_TABLET | Freq: Three times a day (TID) | ORAL | Status: DC
  Administered 2013-08-09 – 2013-08-14 (×15): 800 mg via ORAL
  Filled 2013-08-09 (×18): qty 1

## 2013-08-09 NOTE — Progress Notes (Signed)
Reviewed and in agreement with treatment provided.  

## 2013-08-09 NOTE — Progress Notes (Signed)
Occupational Therapy Session Note  Patient Details  Name: Paul [name] MRN: [id number] Date of Birth: 1955-10-20  Today's Date: 08/09/2013 Time: 1300-1330 Time Calculation (min): 30 min  Short Term Goals: Week 1:  OT Short Term Goal 1 (Week 1): Patient will perform toilet transfer with minimal assistance using DME OT Short Term Goal 2 (Week 1): Patient will complete LB dressing(pants only) with minimal assistance OT Short Term Goal 3 (Week 1): Patient will be educated on a BUE strengthening HEP in order to increase overall independence with functional transfers  Skilled Therapeutic Interventions/Progress Updates:  Patient resting in w/c upon arrival with wife at his side.  Engaged in commode transfers using wide BSC (drop arm) to allow for room to perform lateral leans for clothing management and hygiene.  Patient performed following 5 transfers with supervision: anterior-w/c>bed, posterior-bed>BSC, anterior-BSC>bed, posterior-bed>w/c, anterior-w/c-bed to rest before next therapy.  Patient also performed lateral leans for pants down then back up again with supervision.  Reviewed need for pressure relief techniques while seated and in bed to include lateral leans and leaning forward.  Patient reports that he leans for only few seconds therefore recommended for 1-2 minutes for each lean.  Patient reports that he uses a timer to remind him.  Reviewed need to always have clothing or towel under his bottom & thighs during any transfer to protect his skin from shearing.  Therapy Documentation Precautions:  Precautions Precautions: Fall Precaution Booklet Issued: No Restrictions Weight Bearing Restrictions: Yes RLE Weight Bearing: Non weight bearing LLE Weight Bearing: Non weight bearing Pain: Denies pain See FIM for current functional status  Therapy/Group: Individual Therapy  Yatzil Clippinger 08/09/2013, 4:26 PM

## 2013-08-09 NOTE — Progress Notes (Signed)
Continent BM during night on bedpan. Bilateral buttocks with small foam dressings. Patient removed dressing to right thumb, blister noted. Denies pain. Patrici Ranks A

## 2013-08-09 NOTE — Progress Notes (Signed)
Subjective/Complaints: Realized his arms are still pretty weak. A 12 point review of systems has been performed and if not noted above is otherwise negative.   Objective: Vital Signs: Blood pressure 123/60, pulse 79, temperature 98.1 F (36.7 C), temperature source Oral, resp. rate 18, weight 74.4 kg (164 lb 0.4 oz), SpO2 98.00%. No results found.  Recent Labs  08/07/13 1642  WBC 13.8*  HGB 8.4*  HCT 26.4*  PLT 479*    Recent Labs  08/07/13 1642  NA 137  K 4.9  CL 95*  GLUCOSE 87  BUN 71*  CREATININE 5.31*  CALCIUM 8.8   CBG (last 3)   Recent Labs  08/08/13 1651 08/08/13 2046 08/09/13 0719  GLUCAP 216* 99 146*    Wt Readings from Last 3 Encounters:  08/09/13 74.4 kg (164 lb 0.4 oz)  08/03/13 76.2 kg (167 lb 15.9 oz)  08/03/13 76.2 kg (167 lb 15.9 oz)    Physical Exam:  Constitutional: He is oriented to person, place, and time. Hair cut, clean  HENT: oral mucosa pink and moist.  Head: Normocephalic.  Eyes: EOM are normal.  Neck: Normal range of motion. Neck supple. No thyromegaly present.  Cardiovascular: Normal rate and regular rhythm. No murmurs, rubs, or gallops  Respiratory: Effort normal and breath sounds normal. No respiratory distress. No wheezes, rales, or rhonchi  GI: Soft. Bowel sounds are normal. He exhibits no distension.  Neurological: He is alert and oriented to person, place, and time. No cranial nerve deficit. Coordination normal.  Patient follows full commands. Deltoids 4+/5. Biceps, triceps, and hands 4+ to 5/5. LE's HF 3+/5, KE3+ /KF 3+    Skin:  Bilateral BKA sites dressed with immediate post-op coban dressing. 2cm blister right thumb Psychiatric: He has a normal mood and affect. His behavior is normal. Thought content normal   Assessment/Plan: 1. Functional deficits secondary to bilateral BKA's which require 3+ hours per day of interdisciplinary therapy in a comprehensive inpatient rehab setting. Physiatrist is  providing close team supervision and 24 hour management of active medical problems listed below. Physiatrist and rehab team continue to assess barriers to discharge/monitor patient progress toward functional and medical goals.      FIM: FIM - Bathing Bathing Steps Patient Completed: Chest;Right Arm;Left Arm;Abdomen;Front perineal area;Buttocks;Right upper leg;Left upper leg Bathing: 5: Supervision: Safety issues/verbal cues  FIM - Upper Body Dressing/Undressing Upper body dressing/undressing steps patient completed: Thread/unthread right sleeve of pullover shirt/dresss;Thread/unthread left sleeve of pullover shirt/dress;Put head through opening of pull over shirt/dress;Pull shirt over trunk Upper body dressing/undressing: 5: Set-up assist to: Obtain clothing/put away FIM - Lower Body Dressing/Undressing Lower body dressing/undressing steps patient completed: Thread/unthread right pants leg;Thread/unthread left pants leg;Pull pants up/down Lower body dressing/undressing: 5: Supervision: Safety issues/verbal cues  FIM - Toileting Toileting: 0: Activity did not occur  FIM - Air cabin crew Transfers: 0-Activity did not occur  FIM - Control and instrumentation engineer Devices: Arm rests;Sliding board Bed/Chair Transfer: 5: Supine > Sit: Supervision (verbal cues/safety issues);5: Sit > Supine: Supervision (verbal cues/safety issues);5: Bed > Chair or W/C: Supervision (verbal cues/safety issues);5: Chair or W/C > Bed: Supervision (verbal cues/safety issues)  FIM - Locomotion: Wheelchair Locomotion: Wheelchair: 1: Travels less than 50 ft with supervision, cueing or coaxing FIM - Locomotion: Ambulation Locomotion: Ambulation: 0: Activity did not occur  Comprehension Comprehension Mode: Auditory Comprehension: 7-Follows complex conversation/direction: With no assist  Expression Expression Mode: Verbal Expression: 7-Expresses complex ideas: With no assist  Social  Interaction Social Interaction: 7-Interacts appropriately with others - No medications needed.  Problem Solving Problem Solving: 7-Solves complex problems: Recognizes & self-corrects  Memory Memory: 7-Complete Independence: No helper  Medical Problem List and Plan:  1. Bilateral BKA secondary to extensive gangrene/calciphylaxis 07/30/2013  2. DVT Prophylaxis/Anticoagulation: Aspirin  3. Pain Management: Neurontin 300 mg 3 times a day, Percocet and Robaxin as needed. Monitor with increased mobility  4. Mood/depression. Zoloft 50 mg daily. Provide emotional support  5. Neuropsych: This patient is capable of making decisions on his own behalf.  6. End-stage renal disease. Continue hemodialysis as per renal services  7. Diabetes mellitus with peripheral neuropathy. Latest hemoglobin A1c 6.7. NovoLog 3 units 3 times a day, Lantus insulin 10 units each bedtime. Has had generally good control 8. History of atrial fibrillation. No anticoagulations secondary to suspect Coumadin necrosis. Cardiac rate controlled  9. Acute on chronic anemia. Latest hemoglobin 8.2. Continue Aranesp  10. Hyperlipidemia. Lipitor  11. GERD. Protonix 12. Wound care: remove dressings in 7-10 days  -coban wrap right thumb --thumb improving---continue wrap  -pt may shower 13. Leukocytosis: 13.8 . No signs of infection at present  LOS (Days) 6 A FACE TO FACE EVALUATION WAS PERFORMED  Paul [name] T 08/09/2013 7:49 AM

## 2013-08-09 NOTE — Progress Notes (Signed)
Occupational Therapy Session Notes  Patient Details  Name: Paul [name] MRN: [id number] Date of Birth: 23-May-1955  Today's Date: 08/09/2013  Short Term Goals: Week 1:  OT Short Term Goal 1 (Week 1): Patient will perform toilet transfer with minimal assistance using DME OT Short Term Goal 2 (Week 1): Patient will complete LB dressing(pants only) with minimal assistance OT Short Term Goal 3 (Week 1): Patient will be educated on a BUE strengthening HEP in order to increase overall independence with functional transfers  Skilled Therapeutic Interventions/Progress Updates:   Session #1 [phone] - 55 Minutes Individual Therapy No complaints of pain Upon entering room, patient found supine in bed. Patient engaged in bed mobility, sat EOB to doff clothes, and transferred OOB using slide board with close supervision. Therapist covered bilateral residual limbs in preparation for shower. Patient then propelled self into bathroom and transferred onto tub transfer bench (simulated like at home) without use of slide board; steady assist. UB/LB bathing completed at supervision level. After showering, patient transferred back to w/c using slide board for safety. Patient completed UB/LB dressing while seated in w/c with supervision and set-up assistance; lateral leans to pull pants up to waist. Therapist notified RN for dressing change. [name] present during entire session, education regarding ADLs started. At end of session, left patient seated at sink in w/c for grooming tasks with PT present for next therapy session.   Session #2 1135-1200 - 25 Minutes Individual Therapy No complaints of pain Upon entering room, patient found seated in w/c with wife present. Patient propelled self from room > ADL apartment and performed elevated toilet seat transfer; anterior/posterior approach. Performed toilet transfer as community re-entry education. Plan to have patient use drop arm BSC for home use. Patient  then propelled self back to room and therapist educated patient on BUE strengthening exercises using theraband and encouraged patient to perform 3 sets of 10 reps X 3 times per day. At end of session, left patient seated in w/c with wife present and all needed items within reach.   Precautions:  Precautions Precautions: Fall Precaution Booklet Issued: No Restrictions Weight Bearing Restrictions: Yes RLE Weight Bearing: Non weight bearing LLE Weight Bearing: Non weight bearing  See FIM for current functional status  Therapy/Group: Individual Therapy  Carolynne Schuchard 08/09/2013, 7:24 AM

## 2013-08-09 NOTE — Progress Notes (Addendum)
NUTRITION FOLLOW UP  Intervention:   1. Supplements;  Nepro Shake po BID, each supplement provides 425 kcal and 19 grams protein per pt request.  2.  General healthful diet; encourage intake of foods and beverages as able.  RD to follow and assess for nutritional adequacy. RD assisted with menu. 3.  Modify diet; consider diet liberalization to Regular or CHO Mod Medium.   NUTRITION DIAGNOSIS:  Increased nutrient needs related to metabolic demand as evidenced by rehab participant.   Monitor:  1. Food/Beverage; pt meeting >/=90% estimated needs with tolerance.  2. Wt/wt change; monitor trends  Assessment:   Pt admitted for rehab s/p amputation; pt now with bilateral BKAs.  Pt known to this RD from previous admission when education r/t Renal diet was presented to pt and family.   Pt eating well; PO 100% of relatively small meals in light of diet restrictions.  Pt with questions re: menu which are answered.  Assisted with dinner order per pt's request.  Will page Renal PA and request diet liberalization.    Height: Ht Readings from Last 1 Encounters:  07/30/13 6\' 2"  (1.88 m)    Weight Status:   Wt Readings from Last 1 Encounters:  08/09/13 164 lb 0.4 oz (74.4 kg)    Re-estimated needs:  Kcal: 2200-2500  Protein: >/= 100 grams daily  Fluid: 1.2 L/day  Skin: no issues noted  Diet Order: Diabetic/Renal   Intake/Output Summary (Last 24 hours) at 08/09/13 1555 Last data filed at 08/09/13 1230  Gross per 24 hour  Intake    560 ml  Output      0 ml  Net    560 ml    Last BM: 3/23  Labs:   Recent Labs Lab 08/03/13 1227 08/05/13 1645 08/07/13 1642  NA 137 139 137  K 4.7 5.3 4.9  CL 95* 91* 95*  CO2 25 19 24   BUN 58* 65* 71*  CREATININE 6.25* 6.03* 5.31*  CALCIUM 8.0* 8.5 8.8  PHOS 4.2 4.3 2.9  GLUCOSE 103* 99 87    CBG (last 3)   Recent Labs  08/08/13 2046 08/09/13 0719 08/09/13 1129  GLUCAP 99 146* 75    Scheduled Meds: . antiseptic oral rinse   15 mL Mouth Rinse BID  . aspirin EC  325 mg Oral Daily  . atorvastatin  10 mg Oral q1800  . collagenase   Topical Daily  . [START ON 08/10/2013] darbepoetin (ARANESP) injection - DIALYSIS  150 mcg Intravenous Q Tue-HD  . feeding supplement (NEPRO CARB STEADY)  237 mL Oral BID BM  . ferric gluconate (FERRLECIT/NULECIT) IV  125 mg Intravenous Q T,Th,Sa-HD  . gabapentin  300 mg Oral TID  . Gerhardt's butt cream   Topical Daily  . insulin aspart  0-15 Units Subcutaneous TID WC  . insulin aspart  3 Units Subcutaneous TID WC  . insulin glargine  10 Units Subcutaneous QHS  . multivitamin  1 tablet Oral QHS  . pantoprazole  40 mg Oral Daily  . sertraline  50 mg Oral Daily  . sevelamer carbonate  800 mg Oral TID WC  . sodium thiosulfate (25%) in normal saline (141ml) infusion (for calciphylaxis)  25 g Intravenous Q T,Th,Sa-HD    Continuous Infusions:   Brynda Greathouse, MS RD LDN Clinical Inpatient Dietitian Pager: [phone] Weekend/After hours pager: [phone]

## 2013-08-09 NOTE — Progress Notes (Signed)
Physical Therapy Session Note  Patient Details  Name: Paul [name] MRN: [id number] Date of Birth: March 27, 1956  Today's Date: 08/09/2013 Time: 0929-1029 Time Calculation (min): 60 min  Short Term Goals: Week 1:  PT Short Term Goal 1 (Week 1): Pt will perform bed mobility with supervision PT Short Term Goal 2 (Week 1): Pt will perform bed to w/c transfer with supervision PT Short Term Goal 3 (Week 1): Pt will perform car transfer with Mod A  Skilled Therapeutic Interventions/Progress Updates:    Session consisted of pt performing w/c mobility from room to front of hospital with S (verbal cueing needed to back w/c into elevator) with pt showing good ability to propel w/c up low grade incline when outside, slideboard transfer w/c<>pt's car with Min guard (cueing for proper w/c positioning and slideboard placement), a-p transfers w/c<>mat with S (cueing for proper w/c positioning with pt demonstrating improved UE strength) , and the following therex with BLE's: assisted hip flexor stretches in S/L, prone hip extension, S/L hip abduction in order to improve BLE strength and ROM. Pt's wife was also present for the session with [place] explaining to her proper guarding technique when pt is performing slideboard car transfers with her demonstrating understanding of this technique with pt performing this transfer into his own car. Pt's wife also vocalizing concerns about weight of w/c in regards to putting w/c into and taking w/c out of car with wife being told that this technique will be instructed on in a later session.  Therapy Documentation Precautions:  Precautions Precautions: Fall Precaution Booklet Issued: No Restrictions Weight Bearing Restrictions: Yes RLE Weight Bearing: Non weight bearing LLE Weight Bearing: Non weight bearing Pain:  Pt with no c/o pain during the session.  See FIM for current functional status  Therapy/Group: Individual Therapy  Emmerich Cryer 08/09/2013, 12:22 PM

## 2013-08-09 NOTE — Progress Notes (Signed)
Physical Therapy Session Note  Patient Details  Name: Paul [name] MRN: [id number] Date of Birth: Jul 23, 1955  Today's Date: 08/09/2013 Time: [id number] Time Calculation (min): 32 min  Short Term Goals: Week 1:  PT Short Term Goal 1 (Week 1): Pt will perform bed mobility with supervision PT Short Term Goal 2 (Week 1): Pt will perform bed to w/c transfer with supervision PT Short Term Goal 3 (Week 1): Pt will perform car transfer with Mod A  Skilled Therapeutic Interventions/Progress Updates:    Session consisted of pt performing a-p transfers with S (blocking of w/c needed throughout) pt presenting with improved hip activation, w/c propulsion room<>rehab gym with S (cueing for proper w/c positioning within room), oblique twists on dynadisc with weighted ball in order to improve core strength, and sitting and pushing bottom off of mat with the use of blocks with handles in order to improve pt's ability to perform a-p transfers (pt showing improved anterior weight shift). Pt continues to show improvements with UE strength and ability to perform transfers. Pt's wife was also instructed on how to properly fold w/c, take off wheels, and pick up w/c with wife demonstrating understanding of this. Wife verbalized that she would be able to safely pick up w/c in its broken down form.   Therapy Documentation Precautions:  Precautions Precautions: Fall Precaution Booklet Issued: No Restrictions Weight Bearing Restrictions: Yes RLE Weight Bearing: Non weight bearing LLE Weight Bearing: Non weight bearing Pain:  Pt had no c/o pain throughout the session.  See FIM for current functional status  Therapy/Group: Individual Therapy  Hersh Minney 08/09/2013, 4:19 PM

## 2013-08-09 NOTE — Progress Notes (Signed)
Subjective:  No cos/ back from some PT/ for HD tomorrow Objective Vital signs in last 24 hours: Filed Vitals:   08/07/13 2120 08/08/13 0500 08/08/13 1300 08/09/13 0542  BP: 148/80 120/66 114/66 123/60  Pulse: 83 71 79 79  Temp: 98.1 F (36.7 C) 98.3 F (36.8 C) 98.4 F (36.9 C) 98.1 F (36.7 C)  TempSrc: Oral Oral Oral Oral  Resp: 18 18 18 18   Weight: 73.9 kg (162 lb 14.7 oz)   74.4 kg (164 lb 0.4 oz)  SpO2: 96% 100% 96% 98%  Exam: alert, nad , appropriate  LUNGS= CTA bilat  CARD=RRR soft SEM no RG  Abd =soft, nt, nd,  EXT= Bilat BKA w dressings in place, no LE edema  DIALYSIS ACCESS=R FA fistula pos bruit   Dialysis: [facility]  4h 81.5kg 2/2.5 Bath RFA AVF Heparin none (due to leg wounds - he has clotted system 2x recentl) > resuming tight hep x 2 then advance as needed  Epo 18000 Na thiosulfate 25gm tiw   Assessment/ Plan= 1 Gangrene bilat LE's, With Calciphylaxis- s/p bilat BKA Now on rehab  2 ESRD on hemodialysis TTS / fu am labs pre hd 3 Calciphylaxis- on Na thio w HD / as dw Dr. Joelyn Oms continue until Surgeon reports post op BKA wounds healing well  4 Anemia - hgb 7.1>6.9>8.9  transfused 2 u prbcs on hd on Aranesp 150 mcg q tues Hd /iron load started  5 MBD cont sevelamer ac / ca and phos stable  6 Hx afib, not on any Rx  7 DM on insulin  8 Hx HTN- BP stable off of BP meds due to orthostatic hypotension  9 Volume- no vol excess, dry wt will need to be lowered from bilat amp  10. Nutrition - Alb 1.4>1,6 With bka/ esrd / prior PD pt/ On supplement and renal carb mod diet / renal vit  Ernest Haber, PA-C [place] [phone] 08/09/2013,10:52 AM  LOS: 6 days  Pt seen, examined and agree w A/P as above.  Kelly Splinter MD pager [phone]    cell [phone] 08/09/2013, 11:14 AM     Labs: Basic Metabolic Panel:  Recent Labs Lab 08/03/13 1227 08/05/13 1645 08/07/13 1642  NA 137 139 137  K 4.7 5.3 4.9  CL 95* 91* 95*  CO2 25 19 24   GLUCOSE  103* 99 87  BUN 58* 65* 71*  CREATININE 6.25* 6.03* 5.31*  CALCIUM 8.0* 8.5 8.8  PHOS 4.2 4.3 2.9    Recent Labs Lab 08/03/13 1227 08/05/13 1645 08/07/13 1642  ALBUMIN 1.4* 1.6* 1.6*  CBC:  Recent Labs Lab 08/03/13 1227 08/05/13 1645 08/07/13 1642  WBC 8.7 15.5* 13.8*  HGB 7.1* 6.9* 8.4*  HCT 23.4* 21.6* 26.4*  MCV 90.0 87.8 88.9  PLT 374 455* 479*    Recent Labs Lab 08/08/13 1017 08/08/13 1052 08/08/13 1651 08/08/13 2046 08/09/13 0719  GLUCAP 47* 83 216* 99 146*  Medications:   . antiseptic oral rinse  15 mL Mouth Rinse BID  . aspirin EC  325 mg Oral Daily  . atorvastatin  10 mg Oral q1800  . collagenase   Topical Daily  . [START ON 08/10/2013] darbepoetin (ARANESP) injection - DIALYSIS  150 mcg Intravenous Q Tue-HD  . feeding supplement (NEPRO CARB STEADY)  237 mL Oral BID BM  . ferric gluconate (FERRLECIT/NULECIT) IV  125 mg Intravenous Q T,Th,Sa-HD  . gabapentin  300 mg Oral TID  . Gerhardt's butt cream   Topical  Daily  . insulin aspart  0-15 Units Subcutaneous TID WC  . insulin aspart  3 Units Subcutaneous TID WC  . insulin glargine  10 Units Subcutaneous QHS  . multivitamin  1 tablet Oral QHS  . pantoprazole  40 mg Oral Daily  . sertraline  50 mg Oral Daily  . sevelamer carbonate  1,600 mg Oral TID WC  . sodium thiosulfate (25%) in normal saline (158ml) infusion (for calciphylaxis)  25 g Intravenous Q T,Th,Sa-HD

## 2013-08-10 ENCOUNTER — Encounter (HOSPITAL_COMMUNITY): Payer: Managed Care, Other (non HMO) | Admitting: Occupational Therapy

## 2013-08-10 ENCOUNTER — Inpatient Hospital Stay (HOSPITAL_COMMUNITY): Payer: Managed Care, Other (non HMO) | Admitting: Occupational Therapy

## 2013-08-10 ENCOUNTER — Inpatient Hospital Stay (HOSPITAL_COMMUNITY): Payer: Managed Care, Other (non HMO)

## 2013-08-10 DIAGNOSIS — S88119A Complete traumatic amputation at level between knee and ankle, unspecified lower leg, initial encounter: Secondary | ICD-10-CM

## 2013-08-10 DIAGNOSIS — I70269 Atherosclerosis of native arteries of extremities with gangrene, unspecified extremity: Secondary | ICD-10-CM

## 2013-08-10 DIAGNOSIS — E1142 Type 2 diabetes mellitus with diabetic polyneuropathy: Secondary | ICD-10-CM

## 2013-08-10 DIAGNOSIS — D649 Anemia, unspecified: Secondary | ICD-10-CM

## 2013-08-10 DIAGNOSIS — R1312 Dysphagia, oropharyngeal phase: Secondary | ICD-10-CM

## 2013-08-10 DIAGNOSIS — E1149 Type 2 diabetes mellitus with other diabetic neurological complication: Secondary | ICD-10-CM

## 2013-08-10 DIAGNOSIS — N186 End stage renal disease: Secondary | ICD-10-CM

## 2013-08-10 LAB — BASIC METABOLIC PANEL
BUN: 71 mg/dL — ABNORMAL HIGH (ref 6–23)
CALCIUM: 8.8 mg/dL (ref 8.4–10.5)
CO2: 26 meq/L (ref 19–32)
CREATININE: 5.9 mg/dL — AB (ref 0.50–1.35)
Chloride: 93 mEq/L — ABNORMAL LOW (ref 96–112)
GFR calc Af Amer: 11 mL/min — ABNORMAL LOW (ref >90)
GFR calc non Af Amer: 9 mL/min — ABNORMAL LOW (ref >90)
GLUCOSE: 238 mg/dL — AB (ref 70–99)
Potassium: 5.6 mEq/L — ABNORMAL HIGH (ref 3.7–5.3)
Sodium: 137 mEq/L (ref 137–147)

## 2013-08-10 LAB — CBC
HCT: 25.5 % — ABNORMAL LOW (ref 39.0–52.0)
HEMOGLOBIN: 7.9 g/dL — AB (ref 13.0–17.0)
MCH: 28.2 pg (ref 26.0–34.0)
MCHC: 31 g/dL (ref 30.0–36.0)
MCV: 91.1 fL (ref 78.0–100.0)
Platelets: 489 10*3/uL — ABNORMAL HIGH (ref 150–400)
RBC: 2.8 MIL/uL — AB (ref 4.22–5.81)
RDW: 17.5 % — ABNORMAL HIGH (ref 11.5–15.5)
WBC: 14.3 10*3/uL — ABNORMAL HIGH (ref 4.0–10.5)

## 2013-08-10 LAB — GLUCOSE, CAPILLARY
GLUCOSE-CAPILLARY: 124 mg/dL — AB (ref 70–99)
Glucose-Capillary: 53 mg/dL — ABNORMAL LOW (ref 70–99)
Glucose-Capillary: 71 mg/dL (ref 70–99)
Glucose-Capillary: 106 mg/dL — ABNORMAL HIGH (ref 70–99)
Glucose-Capillary: 165 mg/dL — ABNORMAL HIGH (ref 70–99)

## 2013-08-10 MED ORDER — DARBEPOETIN ALFA-POLYSORBATE 150 MCG/0.3ML IJ SOLN
INTRAMUSCULAR | Status: AC
  Administered 2013-08-10: 150 ug
  Filled 2013-08-10: qty 0.3

## 2013-08-10 NOTE — Progress Notes (Signed)
Occupational Therapy Session Notes  Patient Details  Name: Paul [name] MRN: [id number] Date of Birth: December 11, 1955  Today's Date: 08/10/2013  Short Term Goals: Week 1:  OT Short Term Goal 1 (Week 1): Patient will perform toilet transfer with minimal assistance using DME OT Short Term Goal 2 (Week 1): Patient will complete LB dressing(pants only) with minimal assistance OT Short Term Goal 3 (Week 1): Patient will be educated on a BUE strengthening HEP in order to increase overall independence with functional transfers  Skilled Therapeutic Interventions/Progress Updates:   Session #1 [phone] - 40 Minutes Missed 20 minutes secondary to patient talking with [facility] Prosthetist.   No complaints of pain  Upon entering room, patient found supine in bed with Prosthetist present. Patient eager to learn about prosthesis. After discussion with prosthetist, patient engaged in bed mobility and sat EOB. Patient then performed slide board transfer from EOB> w/c with supervision. Patient then propelled self into bathroom for shower transfer without using slide board with supervision. UB/LB bathing completed seated on tub bench, performing lateral leans for peri cleansing. After bathing, patient dried and performed LB dressing while seated on bench (supervision), then transferred back to w/c without use of slide board with supervision. UB dressing completed while seated in w/c. Patient donned bilateral leg rests > w/c. Nursing present for dressing changes > buttocks. At end of session, left patient seated at sink in w/c to complete grooming tasks. Patient able to maneuver around room prn in w/c.     Session #2 1130-1200 - 30 Minutes Individual Therapy No complaints of pain Upon entering room, patient found supine in bed with wife present. Patient engaged in bed mobility and transferred > w/c performing posterior transfer. Patient donned bilateral leg rests, then propelled self >  therapy gym. Patient transferred onto therapy mat using slide board and education > wife performed on safe and effective slide board transfers. Patient then transferred back with supervision from wife. Patient performed BUE strengthening exercises using ergometer for 10 minutes, then propelled self back to room with wife. Therapist provided a "[employer]" magazine and booklet on w/c accessible locations in [place].   Precautions:  Precautions Precautions: Fall Precaution Booklet Issued: No Restrictions Weight Bearing Restrictions: Yes RLE Weight Bearing: Non weight bearing LLE Weight Bearing: Non weight bearing  See FIM for current functional status  [name] 08/10/2013, 7:24 AM

## 2013-08-10 NOTE — Progress Notes (Signed)
Subjective/Complaints: Popped a rib in therapy yesterday. I do it at home too.  Otherwise feeling pretty well.  A 12 point review of systems has been performed and if not noted above is otherwise negative.   Objective: Vital Signs: Blood pressure 116/68, pulse 70, temperature 98.2 F (36.8 C), temperature source Oral, resp. rate 18, weight 74.4 kg (164 lb 0.4 oz), SpO2 95.00%. No results found.  Recent Labs  08/07/13 1642  WBC 13.8*  HGB 8.4*  HCT 26.4*  PLT 479*    Recent Labs  08/07/13 1642  NA 137  K 4.9  CL 95*  GLUCOSE 87  BUN 71*  CREATININE 5.31*  CALCIUM 8.8   CBG (last 3)   Recent Labs  08/09/13 1642 08/09/13 2139 08/10/13 0722  GLUCAP 257* 120* 124*    Wt Readings from Last 3 Encounters:  08/09/13 74.4 kg (164 lb 0.4 oz)  08/03/13 76.2 kg (167 lb 15.9 oz)  08/03/13 76.2 kg (167 lb 15.9 oz)    Physical Exam:  Constitutional: He is oriented to person, place, and time. Hair cut, clean  HENT: oral mucosa pink and moist.  Head: Normocephalic.  Eyes: EOM are normal.  Neck: Normal range of motion. Neck supple. No thyromegaly present.  Cardiovascular: Normal rate and regular rhythm. No murmurs, rubs, or gallops  Respiratory: Effort normal and breath sounds normal. No respiratory distress. No wheezes, rales, or rhonchi  GI: Soft. Bowel sounds are normal. He exhibits no distension.  Neurological: He is alert and oriented to person, place, and time. No cranial nerve deficit. Coordination normal.  Patient follows full commands. Deltoids 4+/5. Biceps, triceps, and hands 4+ to 5/5. LE's HF 3+/5, KE3+ /KF 3+    Skin:  Bilateral BKA sites dressed with immediate post-op coban dressing. 2cm blister right thumb Psychiatric: He has a normal mood and affect. His behavior is normal. Thought content normal   Assessment/Plan: 1. Functional deficits secondary to bilateral BKA's which require 3+ hours per day of interdisciplinary therapy in a comprehensive  inpatient rehab setting. Physiatrist is providing close team supervision and 24 hour management of active medical problems listed below. Physiatrist and rehab team continue to assess barriers to discharge/monitor patient progress toward functional and medical goals.    Have asked [facility] to see pt for prosthetic support/education  FIM: FIM - Bathing Bathing Steps Patient Completed: Chest;Right Arm;Left Arm;Abdomen;Front perineal area;Buttocks;Right upper leg;Left upper leg Bathing: 5: Supervision: Safety issues/verbal cues  FIM - Upper Body Dressing/Undressing Upper body dressing/undressing steps patient completed: Thread/unthread right sleeve of pullover shirt/dresss;Thread/unthread left sleeve of pullover shirt/dress;Put head through opening of pull over shirt/dress;Pull shirt over trunk Upper body dressing/undressing: 5: Set-up assist to: Obtain clothing/put away FIM - Lower Body Dressing/Undressing Lower body dressing/undressing steps patient completed: Thread/unthread right pants leg;Thread/unthread left pants leg;Pull pants up/down Lower body dressing/undressing: 5: Supervision: Safety issues/verbal cues  FIM - Toileting Toileting: 0: Activity did not occur  FIM - Radio producer Devices: Elevated toilet seat;Grab bars Toilet Transfers: 5-To toilet/BSC: Supervision (verbal cues/safety issues);5-From toilet/BSC: Supervision (verbal cues/safety issues)  FIM - Engineer, site Assistive Devices: Arm rests;Sliding board Bed/Chair Transfer: 5: Supine > Sit: Supervision (verbal cues/safety issues);5: Sit > Supine: Supervision (verbal cues/safety issues);5: Bed > Chair or W/C: Supervision (verbal cues/safety issues);5: Chair or W/C > Bed: Supervision (verbal cues/safety issues)  FIM - Locomotion: Wheelchair Locomotion: Wheelchair: 5: Travels 150 ft or more: maneuvers on rugs and over door  sills with supervision, cueing or  coaxing FIM - Locomotion: Ambulation Locomotion: Ambulation: 0: Activity did not occur  Comprehension Comprehension Mode: Auditory Comprehension: 7-Follows complex conversation/direction: With no assist  Expression Expression Mode: Verbal Expression: 7-Expresses complex ideas: With no assist  Social Interaction Social Interaction: 7-Interacts appropriately with others - No medications needed.  Problem Solving Problem Solving: 7-Solves complex problems: Recognizes & self-corrects  Memory Memory: 7-Complete Independence: No helper  Medical Problem List and Plan:  1. Bilateral BKA secondary to extensive gangrene/calciphylaxis 07/30/2013   -remove dressing probably thursday 2. DVT Prophylaxis/Anticoagulation: Aspirin  3. Pain Management: Neurontin 300 mg 3 times a day, Percocet and Robaxin as needed. Monitor with increased mobility  4. Mood/depression. Zoloft 50 mg daily. Provide emotional support  5. Neuropsych: This patient is capable of making decisions on his own behalf.  6. End-stage renal disease. Continue hemodialysis as per renal services  7. Diabetes mellitus with peripheral neuropathy. Latest hemoglobin A1c 6.7. NovoLog 3 units 3 times a day, Lantus insulin 10 units each bedtime. Has had generally good control 8. History of atrial fibrillation. No anticoagulations secondary to suspect Coumadin necrosis. Cardiac rate controlled  9. Acute on chronic anemia. Latest hemoglobin 8.2. Continue Aranesp  10. Hyperlipidemia. Lipitor  11. GERD. Protonix 12. Wound care: remove dressings in 7-10 days  -coban wrap right thumb --thumb improving---continue wrap  -pt may shower 13. Leukocytosis: 13.8 . No signs of infection at present  LOS (Days) 7 A FACE TO FACE EVALUATION WAS PERFORMED  Briea Mcenery T 08/10/2013 7:52 AM

## 2013-08-10 NOTE — Progress Notes (Signed)
Reviewed and in agreement with treatment provided.  

## 2013-08-10 NOTE — Significant Event (Signed)
Hypoglycemic Event  CBG: 57  Treatment: 15 GM carbohydrate snack  Symptoms: None  Follow-up CBG: Time:1135 CBG Result:71 Possible Reasons for Event: Unknown  Comments/MD notified    Milus Mallick  Remember to initiate Hypoglycemia Order Set & complete

## 2013-08-10 NOTE — Progress Notes (Signed)
Physical Therapy Session Note  Patient Details  Name: Paul [name] MRN: [id number] Date of Birth: July 06, 1955  Today's Date: 08/10/2013 Time: 1316-1400 Time Calculation (min): 44 min  Short Term Goals: Week 1:  PT Short Term Goal 1 (Week 1): Pt will perform bed mobility with supervision PT Short Term Goal 2 (Week 1): Pt will perform bed to w/c transfer with supervision PT Short Term Goal 3 (Week 1): Pt will perform car transfer with Mod A  Skilled Therapeutic Interventions/Progress Updates:    Session consisted of pt performing slideboard transfers w/c<>car and w/c<>mat with Min assist (cueing for proper w/c positioning) pt showing improved ability to independently place slideboard and UE strength and hip activation throughout and a-p w/c>bed transfer with S (blocking of w/c required). Pt also performed 1x10 of the following BLE therex with manual resistance: LAQ, S/L hip abduction, prone hip extension, SLR (resisting both hip flexion and hip extension), and adductor pillow squeezes in order to improve BLE strength and ROM. Pt's wife also present during the session with her practicing positioning and assistance technique when husband is performing slideboard transfers with her demonstrating understanding of these techniques.   Therapy Documentation Precautions:  Precautions Precautions: Fall Precaution Booklet Issued: No Restrictions Weight Bearing Restrictions: Yes RLE Weight Bearing: Non weight bearing LLE Weight Bearing: Non weight bearing Pain: Pt had no c/o pain during the session.  See FIM for current functional status  Therapy/Group: Individual Therapy  Ovid Witman 08/10/2013, 4:23 PM

## 2013-08-10 NOTE — Progress Notes (Signed)
Physical Therapy Session Note  Patient Details  Name: Paul [name] MRN: [id number] Date of Birth: Nov 26, 1955  Today's Date: 08/10/2013 Time: 0931-1030 Time Calculation (min): 59 min  Short Term Goals: Week 1:  PT Short Term Goal 1 (Week 1): Pt will perform bed mobility with supervision PT Short Term Goal 2 (Week 1): Pt will perform bed to w/c transfer with supervision PT Short Term Goal 3 (Week 1): Pt will perform car transfer with Mod A  Skilled Therapeutic Interventions/Progress Updates:    Session consisted of pt performing a-p transfers w/c<>mat and w/c>bed with S (blocking of w/c needed when performing transfer) with pt showing improved UE strength throughout, w/c mobility room<>gym with S (cueing for proper w/c positioning in room), and 1x10 of core exercises on dynadisc including: weighted ball catches with oblique twists, perturbations in various directions, ball catching and throwing, and reaching for horseshoes outside his BOS. Pt performed 1x10 of other core exercises during the session including situps with use of the wedge and pt in supine then coming up on forearms. Pt showing improvement with core strength with all exercises performed. Pt also performed w/c pushups in order to improve BUE strength with pt able to properly indicate how often pressure relief needed to be performed when in w/c.   Therapy Documentation Precautions:  Precautions Precautions: Fall Precaution Booklet Issued: No Restrictions Weight Bearing Restrictions: Yes RLE Weight Bearing: Non weight bearing LLE Weight Bearing: Non weight bearing Pain:  Pt had no c/o pain during the session.  See FIM for current functional status  Therapy/Group: Individual Therapy  Kathrynn Backstrom 08/10/2013, 12:35 PM

## 2013-08-11 ENCOUNTER — Inpatient Hospital Stay (HOSPITAL_COMMUNITY): Payer: Managed Care, Other (non HMO)

## 2013-08-11 ENCOUNTER — Encounter (HOSPITAL_COMMUNITY): Payer: Managed Care, Other (non HMO) | Admitting: Occupational Therapy

## 2013-08-11 ENCOUNTER — Inpatient Hospital Stay (HOSPITAL_COMMUNITY): Payer: Managed Care, Other (non HMO) | Admitting: Occupational Therapy

## 2013-08-11 DIAGNOSIS — E1142 Type 2 diabetes mellitus with diabetic polyneuropathy: Secondary | ICD-10-CM

## 2013-08-11 DIAGNOSIS — E1149 Type 2 diabetes mellitus with other diabetic neurological complication: Secondary | ICD-10-CM

## 2013-08-11 DIAGNOSIS — N186 End stage renal disease: Secondary | ICD-10-CM

## 2013-08-11 DIAGNOSIS — D649 Anemia, unspecified: Secondary | ICD-10-CM

## 2013-08-11 DIAGNOSIS — R1312 Dysphagia, oropharyngeal phase: Secondary | ICD-10-CM

## 2013-08-11 DIAGNOSIS — I70269 Atherosclerosis of native arteries of extremities with gangrene, unspecified extremity: Secondary | ICD-10-CM

## 2013-08-11 DIAGNOSIS — S88119A Complete traumatic amputation at level between knee and ankle, unspecified lower leg, initial encounter: Secondary | ICD-10-CM

## 2013-08-11 LAB — GLUCOSE, CAPILLARY
GLUCOSE-CAPILLARY: 44 mg/dL — AB (ref 70–99)
GLUCOSE-CAPILLARY: 52 mg/dL — AB (ref 70–99)
GLUCOSE-CAPILLARY: 120 mg/dL — AB (ref 70–99)
GLUCOSE-CAPILLARY: 137 mg/dL — AB (ref 70–99)
Glucose-Capillary: 97 mg/dL (ref 70–99)
Glucose-Capillary: 105 mg/dL — ABNORMAL HIGH (ref 70–99)
Glucose-Capillary: 197 mg/dL — ABNORMAL HIGH (ref 70–99)

## 2013-08-11 NOTE — Progress Notes (Signed)
Subjective: Working w PT, possible d/c on Friday this week  Filed Vitals:   08/10/13 1800 08/10/13 1830 08/10/13 1900 08/11/13 0536  BP: 140/67 135/73 149/78 110/61  Pulse: 61 61 60 65  Temp:   98 F (36.7 C) 95.5 F (35.3 C)  TempSrc:   Oral Oral  Resp:   18 17  Weight:   76.5 kg (168 lb 10.4 oz) 76.5 kg (168 lb 10.4 oz)  SpO2:   95% 96%   Exam: Alert, up in bed working w PT No jvd Chest clear bilat RRR no MRG Abd soft, nt, nd Bilat BKA no edema Neuro nf, ox3   Dialysis: TTS [facility] 4h  81.5kg (new edw ~74kg)  2/2.5   RFA AVF   Heparin - resumed as inpt, was on hold due to bleeding leg wounds      Epo 18000  Na thiosulfate 25gm tiw until wounds heal    Assessment: 1 Gangrene LE's / calciphylaxis, s/p bilat BKA 2 Debility on rehab 3 ESRD 4 HTN off bp meds due to orthostatic hypotension 5 Anemia on darbe 150/wk, Hb 7.9, getting IV Fe load 6 MBD on binders 7 DM on insulin 8 Volume - stable, new dry wt ~74kg  Plan HD tomorrow    Kelly Splinter MD  pager [phone]    cell [phone]  08/11/2013, 9:49 AM     Recent Labs Lab 08/05/13 1645 08/07/13 1642 08/10/13 1514  NA 139 137 137  K 5.3 4.9 5.6*  CL 91* 95* 93*  CO2 19 24 26   GLUCOSE 99 87 238*  BUN 65* 71* 71*  CREATININE 6.03* 5.31* 5.90*  CALCIUM 8.5 8.8 8.8  PHOS 4.3 2.9  --     Recent Labs Lab 08/05/13 1645 08/07/13 1642  ALBUMIN 1.6* 1.6*    Recent Labs Lab 08/05/13 1645 08/07/13 1642 08/10/13 1514  WBC 15.5* 13.8* 14.3*  HGB 6.9* 8.4* 7.9*  HCT 21.6* 26.4* 25.5*  MCV 87.8 88.9 91.1  PLT 455* 479* 489*   . antiseptic oral rinse  15 mL Mouth Rinse BID  . aspirin EC  325 mg Oral Daily  . atorvastatin  10 mg Oral q1800  . collagenase   Topical Daily  . darbepoetin (ARANESP) injection - DIALYSIS  150 mcg Intravenous Q Tue-HD  . feeding supplement (NEPRO CARB STEADY)  237 mL Oral BID BM  . ferric gluconate (FERRLECIT/NULECIT) IV  125 mg Intravenous Q T,Th,Sa-HD  . gabapentin  300 mg Oral  TID  . Gerhardt's butt cream   Topical Daily  . insulin aspart  0-15 Units Subcutaneous TID WC  . insulin aspart  3 Units Subcutaneous TID WC  . insulin glargine  10 Units Subcutaneous QHS  . multivitamin  1 tablet Oral QHS  . pantoprazole  40 mg Oral Daily  . sertraline  50 mg Oral Daily  . sevelamer carbonate  800 mg Oral TID WC  . sodium thiosulfate infusion for calciphylaxis  25 g Intravenous Q T,Th,Sa-HD     feeding supplement (RESOURCE BREEZE), methocarbamol, ondansetron (ZOFRAN) IV, ondansetron, oxyCODONE-acetaminophen, sorbitol

## 2013-08-11 NOTE — Patient Care Conference (Signed)
Inpatient RehabilitationTeam Conference and Plan of Care Update Date: 08/10/2013   Time: 2:42 PM    Patient Name: Paul [name]      Medical Record Number: [id number]  Date of Birth: 1955/09/11 Sex: Male         Room/Bed: 4M11C/4M11C-01 Payor Info: Payor: [employer] / Plan: Market researcher / Product Type: *No Product type* /    Admitting Diagnosis: B BKA  Admit Date/Time:  08/03/2013  5:05 PM Admission Comments: No comment available   Primary Diagnosis:  <principal problem not specified> Principal Problem: <principal problem not specified>  Patient Active Problem List   Diagnosis Date Noted  . S/P bilateral BKA (below knee amputation) 08/03/2013  . S/P BKA (below knee amputation) bilateral 07/30/2013  . Open wound of both legs with complication 0000000  . Acute blood loss anemia 07/12/2013  . Syncope 07/09/2013  . Intolerance to CAPD peritoneal dialysis s/p CAPD cath removal 07/09/2013 07/05/2013  . Atherosclerosis of native arteries of the extremities with ulceration(440.23) 06/17/2013  . Gait disorder 05/31/2013  . Weakness 05/24/2013  . Depression 04/03/2013  . GERD (gastroesophageal reflux disease) 04/03/2013  . Atrial fibrillation 04/02/2013  . DM (diabetes mellitus) 12/28/2012  . Other complications due to renal dialysis device, implant, and graft 05/19/2012  . Anemia 07/02/2011  . ESRD on hemodialysis 05/29/2011    Expected Discharge Date: Expected Discharge Date: 08/14/13 (d/c after HD on Sat.)  Team Members Present: Physician leading conference: Dr. Alger Simons Social Worker Present: Lennart Pall, LCSW Nurse Present: Rozetta Nunnery, RN PT Present: Canary Brim, PT OT Present: Chrys Racer, Starling Manns, OT PPS Coordinator present : Ileana Ladd, PT     Current Status/Progress Goal Weekly Team Focus  Medical   bilateral BKA's. wound care issues. arms weak  pain control, building up strength  skin care, pain control, HD   Bowel/Bladder   dialysis. continent of  bowel. last BM 3/23  remain continent.   monitor for constipation. medicate prn if no bowels on 3rd day.   Swallow/Nutrition/ Hydration             ADL's   supervision>min assist  overall mod I  ADL retraining, safety, functional transfers, UB strengthening, overall activity tolerance/endurance   Mobility   S anterior-posterior basic transfers, Min A slideboard car transfers, S bed mobility, S w/c mobility  Mod I w/c level, Min A car transfers  basic anterior-posterior basic transfers, slideboard car transfers, w/c mobility, dynamic sitting balance, bed mobility, BLE and core strengthening, and discharge planning   Communication             Safety/Cognition/ Behavioral Observations            Pain   denies pain  pain less than 2  pre-medicate for therapy   Skin   blister to right thumb. Coban in place. OT to apply. wound to R and L buttock.   continue with dressing changes and monitor  assess q shift and prn    Rehab Goals Patient on target to meet rehab goals: Yes *See Care Plan and progress notes for long and short-term goals.  Barriers to Discharge: weak UE's. 2 amputations at once, STE home    Possible Resolutions to Barriers:  ramp into home    Discharge Planning/Teaching Needs:  home with wife who can provide 24/7 assistance      Team Discussion:  Making good gains and reaching mod i goals.  Still needs to get ramp in place - wife working with ramp  company. No concerns.  Revisions to Treatment Plan:  None   Continued Need for Acute Rehabilitation Level of Care: The patient requires daily medical management by a physician with specialized training in physical medicine and rehabilitation for the following conditions: Daily direction of a multidisciplinary physical rehabilitation program to ensure safe treatment while eliciting the highest outcome that is of practical value to the patient.: Yes Daily medical management of patient stability for increased activity during  participation in an intensive rehabilitation regime.: Yes Daily analysis of laboratory values and/or radiology reports with any subsequent need for medication adjustment of medical intervention for : Neurological problems;Post surgical problems  Clemence Lengyel 08/11/2013, 1:51 PM

## 2013-08-11 NOTE — Progress Notes (Signed)
Reviewed and in agreement with treatment provided.  

## 2013-08-11 NOTE — Significant Event (Signed)
Hypoglycemic Event  CBG: 44   Treatment: 15 GM carbohydrate snack  Symptoms: Vision changes  Follow-up CBG: [id number] CBG Result:52  Possible Reasons for Event: Change in activity  Comments/MD notified:Patient eating and drinking snack, will recheck in 15 mins.    Dorthula Nettles G  Remember to initiate Hypoglycemia Order Set & complete

## 2013-08-11 NOTE — Progress Notes (Signed)
Occupational Therapy Session Notes  Patient Details  Name: Paul [name] MRN: [id number] Date of Birth: Jun 16, 1955  Today's Date: 08/11/2013  Short Term Goals: Week 1:  OT Short Term Goal 1 (Week 1): Patient will perform toilet transfer with minimal assistance using DME OT Short Term Goal 2 (Week 1): Patient will complete LB dressing(pants only) with minimal assistance OT Short Term Goal 3 (Week 1): Patient will be educated on a BUE strengthening HEP in order to increase overall independence with functional transfers  Skilled Therapeutic Interventions/Progress Updates:   Session #1 [phone] - 60 Minutes Individual Therapy No complaints of pain Upon entering room, patient found sitting up in bed. Therapist administered compression wrapping hand out for patient to study/read. Patient covered bilateral residual limbs for shower, then performed slide board transfer > w/c with distant supervision. From here, patient propelled self into shower for tub bench transfer into shower stall with supervision (without use of slide board). Patient completed UB/LB bathing at a mod I> occasional supervision level. Patient dried seated on tub bench, donned underwear and pants, then transferred back to w/c without use of slide board (steady assist for transfer). From here, patient completed UB dressing seated in w/c. Therapist notified RN to change dressing > buttock. Patient with complaints of diplopia, therapist notified RN and NT to check glucose levels = 44, Rn aware and NT administered juice. Patient also donned dressing to right thumb independently. At end of session, patient transferred back to EOB with supervision and left seated EOB with RN present.   Session #2 1130-1200 - 30 Minutes Individual Therapy No complaints of pain Upon entering room, patient found supine in bed with wife present at bed side. Patient transferred from bed > w/c using slide board with supervision. From here, patient performed  slide board transfer <> recliner (simulated like when patient goes to dialysis). Patient supervision for recliner transfer using slide board. Patient then performed w/c pushups, 3 sets of 10 reps independently. Therapist talked with patient and wife about compression wrapping. At end of session, left patient seated in w/c with all needed items within reach. SW present during session and discussed ramp and d/c Saturday after dialysis.   Precautions:  Precautions Precautions: Fall Precaution Booklet Issued: No Restrictions Weight Bearing Restrictions: Yes RLE Weight Bearing: Non weight bearing LLE Weight Bearing: Non weight bearing  See FIM for current functional status  [name] 08/11/2013, 7:21 AM

## 2013-08-11 NOTE — Progress Notes (Signed)
Physical Therapy Session Note  Patient Details  Name: Paul [name] MRN: [id number] Date of Birth: [birth year]  Today's Date: 08/11/2013 Time: [id number] Time Calculation (min): 56 min  Short Term Goals: Week 1:  PT Short Term Goal 1 (Week 1): Pt will perform bed mobility with supervision PT Short Term Goal 2 (Week 1): Pt will perform bed to w/c transfer with supervision PT Short Term Goal 3 (Week 1): Pt will perform car transfer with Mod A  Skilled Therapeutic Interventions/Progress Updates:    Session consisted of pt performing therex 2x10 of the following exercises: LAQ with manual resistance, seated hip flexion with manual resistance, supine hip flexion, and SLR in order to improve BLE strength, lateral leans 1x10 to both L and R (presenting with improved UE strength) in order to improve ability to perform sidelying to sit, w/c propulsion room<>gym with Mod I (in controlled environment) in order to improve functional independence, a-p transfer w/c>bed and slideboard transfer w/c<>mat with S (blocking of w/c required with pt demonstrating improved w/c positioning), and pt performing a high incline ramp x 2 with S-Min A (presenting with good ability to perform sharp turn and propel w/c up ramp with only slight assistance). Pt's blood glucose levels were checked by RN and were low at the beginning of session therefore began with therex in bed and waited for pt's blood glucose levels to rise (as checked by nurse tech) before taking him to the gym with pt stating that he was having no adverse symptoms.   Therapy Documentation Precautions:  Precautions Precautions: Fall Precaution Booklet Issued: No Restrictions Weight Bearing Restrictions: Yes RLE Weight Bearing: Non weight bearing LLE Weight Bearing: Non weight bearing Pain:  Pt had no c/o pain during the session.  See FIM for current functional status  Therapy/Group: Individual Therapy  Ariauna Farabee 08/11/2013, 12:20 PM

## 2013-08-11 NOTE — Progress Notes (Signed)
Physical Therapy Session Note  Patient Details  Name: Paul [name] MRN: [id number] Date of Birth: Sep 03, 1955  Today's Date: 08/11/2013 Time: [id number] Time Calculation (min): 43 min  Short Term Goals: Week 1:  PT Short Term Goal 1 (Week 1): Pt will perform bed mobility with supervision PT Short Term Goal 2 (Week 1): Pt will perform bed to w/c transfer with supervision PT Short Term Goal 3 (Week 1): Pt will perform car transfer with Mod A  Skilled Therapeutic Interventions/Progress Updates:    Session focused on educating pt's wife on proper assistance technique when pt is performing ramp with w/c and on proper assistance technique when pt is performing slideboard transfer w/c<>car (requiring S) with wife demonstrating understanding of these techniques. Pt also performed a-p w/c<>mat and w/c>bed with Mod I (pt showing improved UE and core strength) and w/c mobility room<>car transfer room with Mod I. Session concluded with therex to BLE including the following exercises: S/L hip abduction (2x10) and S/L hip flexor stretches (3x30 seconds) in order to improve BLE strength and ROM.  Therapy Documentation Precautions:  Precautions Precautions: Fall Precaution Booklet Issued: No Restrictions Weight Bearing Restrictions: Yes RLE Weight Bearing: Non weight bearing LLE Weight Bearing: Non weight bearing Pain:   Pt had no c/o pain throughout the session.   See FIM for current functional status  Therapy/Group: Individual Therapy  Marlaine Arey 08/11/2013, 3:07 PM

## 2013-08-11 NOTE — Significant Event (Signed)
Hypoglycemic Event  CBG: 52  Treatment: 15 GM carbohydrate snack  Symptoms: Vision changes  Follow-up CBG: Time:1009 CBG Result:105    Possible Reasons for Event: Change in activity  Comments/MD notified:Patient currently with physical therapy, will continue to monitor.    Paul [name] G  Remember to initiate Hypoglycemia Order Set & complete

## 2013-08-11 NOTE — Progress Notes (Signed)
Social Work Patient ID: Paul [name], male   DOB: Oct 15, 1955, 58 y.o.   MRN: [id number]  Met with pt and wife today to review team conference.  Both aware and agreeable with plan for d/c Sat. 3/28 following HD.  Pleased with progress being made.  Wife still working with [employer] on getting ramp at home and hopes this will be in place by end of week.  Have also left message with Jamey Reas, PT @ OP requesting peer visit if possible prior to pt's d/c.  Continue to follow for d/c needs and support.  Shelonda Saxe, LCSW

## 2013-08-11 NOTE — Progress Notes (Signed)
Subjective/Complaints: Feeling well. No problems overnight A 12 point review of systems has been performed and if not noted above is otherwise negative.   Objective: Vital Signs: Blood pressure 110/61, pulse 65, temperature 95.5 F (35.3 C), temperature source Oral, resp. rate 17, weight 76.5 kg (168 lb 10.4 oz), SpO2 96.00%. No results found.  Recent Labs  08/10/13 1514  WBC 14.3*  HGB 7.9*  HCT 25.5*  PLT 489*    Recent Labs  08/10/13 1514  NA 137  K 5.6*  CL 93*  GLUCOSE 238*  BUN 71*  CREATININE 5.90*  CALCIUM 8.8   CBG (last 3)   Recent Labs  08/10/13 1932 08/10/13 2038 08/11/13 0720  GLUCAP 106* 165* 97    Wt Readings from Last 3 Encounters:  08/11/13 76.5 kg (168 lb 10.4 oz)  08/03/13 76.2 kg (167 lb 15.9 oz)  08/03/13 76.2 kg (167 lb 15.9 oz)    Physical Exam:  Constitutional: He is oriented to person, place, and time. Hair cut, clean  HENT: oral mucosa pink and moist.  Head: Normocephalic.  Eyes: EOM are normal.  Neck: Normal range of motion. Neck supple. No thyromegaly present.  Cardiovascular: Normal rate and regular rhythm. No murmurs, rubs, or gallops  Respiratory: Effort normal and breath sounds normal. No respiratory distress. No wheezes, rales, or rhonchi  GI: Soft. Bowel sounds are normal. He exhibits no distension.  Neurological: He is alert and oriented to person, place, and time. No cranial nerve deficit. Coordination normal.  Patient follows full commands. Deltoids 4+/5. Biceps, triceps, and hands 4+ to 5/5. LE's HF 3+/5, KE3+ /KF 3+    Skin:  Bilateral BKA sites dressed with immediate post-op coban dressing. 2cm blister right thumb--drying up  -small 1.5cm stage 2 on left gluteal fold---clean, minimal drainage FND Psychiatric: He has a normal mood and affect. His behavior is normal. Thought content normal   Assessment/Plan: 1. Functional deficits secondary to bilateral BKA's which require 3+ hours per day of  interdisciplinary therapy in a comprehensive inpatient rehab setting. Physiatrist is providing close team supervision and 24 hour management of active medical problems listed below. Physiatrist and rehab team continue to assess barriers to discharge/monitor patient progress toward functional and medical goals.    [employer] for education/ support  FIM: FIM - Bathing Bathing Steps Patient Completed: Chest;Right Arm;Left Arm;Abdomen;Front perineal area;Buttocks;Right upper leg;Left upper leg Bathing: 5: Supervision: Safety issues/verbal cues  FIM - Upper Body Dressing/Undressing Upper body dressing/undressing steps patient completed: Thread/unthread right sleeve of pullover shirt/dresss;Thread/unthread left sleeve of pullover shirt/dress;Put head through opening of pull over shirt/dress;Pull shirt over trunk Upper body dressing/undressing: 5: Set-up assist to: Obtain clothing/put away FIM - Lower Body Dressing/Undressing Lower body dressing/undressing steps patient completed: Thread/unthread right pants leg;Thread/unthread left pants leg;Pull pants up/down;Thread/unthread right underwear leg;Thread/unthread left underwear leg;Pull underwear up/down Lower body dressing/undressing: 5: Supervision: Safety issues/verbal cues  FIM - Toileting Toileting: 0: Activity did not occur  FIM - Radio producer Devices: Elevated toilet seat;Grab bars Toilet Transfers: 5-To toilet/BSC: Supervision (verbal cues/safety issues);5-From toilet/BSC: Supervision (verbal cues/safety issues)  FIM - Engineer, site Assistive Devices: HOB elevated;Bed rails;Arm rests Bed/Chair Transfer: 5: Sit > Supine: Supervision (verbal cues/safety issues);5: Bed > Chair or W/C: Supervision (verbal cues/safety issues);5: Chair or W/C > Bed: Supervision (verbal cues/safety issues) (anterior-posterior transfer)  FIM - Locomotion: Wheelchair Locomotion: Wheelchair: 5: Travels  150 ft or more: maneuvers on rugs and over door  sills with supervision, cueing or coaxing FIM - Locomotion: Ambulation Locomotion: Ambulation: 0: Activity did not occur  Comprehension Comprehension Mode: Auditory Comprehension: 7-Follows complex conversation/direction: With no assist  Expression Expression Mode: Verbal Expression: 7-Expresses complex ideas: With no assist  Social Interaction Social Interaction: 6-Interacts appropriately with others with medication or extra time (anti-anxiety, antidepressant). (on Zoloft)  Problem Solving Problem Solving: 7-Solves complex problems: Recognizes & self-corrects  Memory Memory: 7-Complete Independence: No helper  Medical Problem List and Plan:  1. Bilateral BKA secondary to extensive gangrene/calciphylaxis 07/30/2013   -remove dressings  thursday 2. DVT Prophylaxis/Anticoagulation: Aspirin  3. Pain Management: Neurontin 300 mg 3 times a day, Percocet and Robaxin as needed. Monitor with increased mobility  4. Mood/depression. Zoloft 50 mg daily. Provide emotional support  5. Neuropsych: This patient is capable of making decisions on his own behalf.  6. End-stage renal disease. Continue hemodialysis as per renal services  7. Diabetes mellitus with peripheral neuropathy. Latest hemoglobin A1c 6.7. NovoLog 3 units 3 times a day, Lantus insulin 10 units each bedtime. Has had generally good control 8. History of atrial fibrillation. No anticoagulations secondary to suspect Coumadin necrosis. Cardiac rate controlled  9. Acute on chronic anemia. Latest hemoglobin 8.2. Continue Aranesp  10. Hyperlipidemia. Lipitor  11. GERD. Protonix 12. Wound care: remove dressings in 7-10 days  -coban wrap right thumb , can apply prn -thumb improving-   -pt may shower 13. Leukocytosis: 13.8 . No signs of infection at present  LOS (Days) 8 A FACE TO FACE EVALUATION WAS PERFORMED  SWARTZ,ZACHARY T 08/11/2013 8:00 AM

## 2013-08-12 ENCOUNTER — Encounter (HOSPITAL_COMMUNITY): Payer: Managed Care, Other (non HMO) | Admitting: *Deleted

## 2013-08-12 ENCOUNTER — Inpatient Hospital Stay (HOSPITAL_COMMUNITY): Payer: Managed Care, Other (non HMO)

## 2013-08-12 ENCOUNTER — Encounter (HOSPITAL_COMMUNITY): Payer: Managed Care, Other (non HMO) | Admitting: Occupational Therapy

## 2013-08-12 LAB — GLUCOSE, CAPILLARY
GLUCOSE-CAPILLARY: 148 mg/dL — AB (ref 70–99)
GLUCOSE-CAPILLARY: 223 mg/dL — AB (ref 70–99)
Glucose-Capillary: 100 mg/dL — ABNORMAL HIGH (ref 70–99)
Glucose-Capillary: 149 mg/dL — ABNORMAL HIGH (ref 70–99)

## 2013-08-12 LAB — BASIC METABOLIC PANEL
BUN: 63 mg/dL — AB (ref 6–23)
CALCIUM: 9 mg/dL (ref 8.4–10.5)
CO2: 26 mEq/L (ref 19–32)
Chloride: 98 mEq/L (ref 96–112)
Creatinine, Ser: 5.63 mg/dL — ABNORMAL HIGH (ref 0.50–1.35)
GFR, EST AFRICAN AMERICAN: 12 mL/min — AB (ref >90)
GFR, EST NON AFRICAN AMERICAN: 10 mL/min — AB (ref >90)
GLUCOSE: 76 mg/dL (ref 70–99)
Potassium: 5.7 mEq/L — ABNORMAL HIGH (ref 3.7–5.3)
Sodium: 139 mEq/L (ref 137–147)

## 2013-08-12 MED ORDER — HEPARIN SODIUM (PORCINE) 1000 UNIT/ML DIALYSIS
3000.0000 [IU] | INTRAMUSCULAR | Status: DC | PRN
  Administered 2013-08-12: 3000 [IU] via INTRAVENOUS_CENTRAL
  Filled 2013-08-12: qty 3

## 2013-08-12 MED ORDER — PENTAFLUOROPROP-TETRAFLUOROETH EX AERO: 1.0000 "application " | INHALATION_SPRAY | CUTANEOUS | Status: DC | PRN

## 2013-08-12 MED ORDER — LIDOCAINE HCL (PF) 1 % IJ SOLN
5.0000 mL | INTRAMUSCULAR | Status: DC | PRN
  Filled 2013-08-12: qty 5

## 2013-08-12 MED ORDER — SODIUM CHLORIDE 0.9 % IV SOLN: 100.0000 mL | INTRAVENOUS | Status: DC | PRN

## 2013-08-12 MED ORDER — ALTEPLASE 2 MG IJ SOLR
2.0000 mg | Freq: Once | INTRAMUSCULAR | Status: DC | PRN
  Filled 2013-08-12: qty 2

## 2013-08-12 MED ORDER — HEPARIN SODIUM (PORCINE) 1000 UNIT/ML DIALYSIS
1000.0000 [IU] | INTRAMUSCULAR | Status: DC | PRN
  Filled 2013-08-12: qty 1

## 2013-08-12 MED ORDER — NEPRO/CARBSTEADY PO LIQD: 237.0000 mL | ORAL | Status: DC | PRN

## 2013-08-12 MED ORDER — LIDOCAINE-PRILOCAINE 2.5-2.5 % EX CREA
1.0000 "application " | TOPICAL_CREAM | CUTANEOUS | Status: DC | PRN
  Filled 2013-08-12: qty 5

## 2013-08-12 MED ORDER — SODIUM CHLORIDE 0.9 % IV SOLN
100.0000 mL | INTRAVENOUS | Status: DC | PRN
Start: 2013-08-12 — End: 2013-08-12

## 2013-08-12 NOTE — Progress Notes (Signed)
Reviewed and in agreement with treatment provided.  

## 2013-08-12 NOTE — Progress Notes (Signed)
Subjective/Complaints: Low sugars again yesterday morning. Today's 100 A 12 point review of systems has been performed and if not noted above is otherwise negative.   Objective: Vital Signs: Blood pressure 130/64, pulse 77, temperature 98.1 F (36.7 C), temperature source Oral, resp. rate 18, weight 76.9 kg (169 lb 8.5 oz), SpO2 100.00%. No results found.  Recent Labs  08/10/13 1514  WBC 14.3*  HGB 7.9*  HCT 25.5*  PLT 489*    Recent Labs  08/10/13 1514  NA 137  K 5.6*  CL 93*  GLUCOSE 238*  BUN 71*  CREATININE 5.90*  CALCIUM 8.8   CBG (last 3)   Recent Labs  08/11/13 1123 08/11/13 1715 08/11/13 2106  GLUCAP 197* 120* 137*    Wt Readings from Last 3 Encounters:  08/12/13 76.9 kg (169 lb 8.5 oz)  08/03/13 76.2 kg (167 lb 15.9 oz)  08/03/13 76.2 kg (167 lb 15.9 oz)    Physical Exam:  Constitutional: He is oriented to person, place, and time. Hair cut, clean  HENT: oral mucosa pink and moist.  Head: Normocephalic.  Eyes: EOM are normal.  Neck: Normal range of motion. Neck supple. No thyromegaly present.  Cardiovascular: Normal rate and regular rhythm. No murmurs, rubs, or gallops  Respiratory: Effort normal and breath sounds normal. No respiratory distress. No wheezes, rales, or rhonchi  GI: Soft. Bowel sounds are normal. He exhibits no distension.  Neurological: He is alert and oriented to person, place, and time. No cranial nerve deficit. Coordination normal.  Patient follows full commands. Deltoids 4+/5. Biceps, triceps, and hands 4+ to 5/5. LE's HF 3+/5, KE3+ /KF 3+    Skin:  Bilateral BKA sites dressed with immediate post-op coban dressing. 2cm blister right thumb--drying up  -small 1.5cm stage 2 on left gluteal fold---clean, minimal drainage FND Psychiatric: He has a normal mood and affect. His behavior is normal. Thought content normal   Assessment/Plan: 1. Functional deficits secondary to bilateral BKA's which require 3+ hours per day  of interdisciplinary therapy in a comprehensive inpatient rehab setting. Physiatrist is providing close team supervision and 24 hour management of active medical problems listed below. Physiatrist and rehab team continue to assess barriers to discharge/monitor patient progress toward functional and medical goals.    [facility] for education/ support  FIM: FIM - Bathing Bathing Steps Patient Completed: Chest;Right Arm;Left Arm;Abdomen;Front perineal area;Buttocks;Right upper leg;Left upper leg Bathing: 5: Supervision: Safety issues/verbal cues  FIM - Upper Body Dressing/Undressing Upper body dressing/undressing steps patient completed: Thread/unthread right sleeve of pullover shirt/dresss;Thread/unthread left sleeve of pullover shirt/dress;Put head through opening of pull over shirt/dress;Pull shirt over trunk Upper body dressing/undressing: 5: Set-up assist to: Obtain clothing/put away FIM - Lower Body Dressing/Undressing Lower body dressing/undressing steps patient completed: Thread/unthread right pants leg;Thread/unthread left pants leg;Pull pants up/down;Thread/unthread right underwear leg;Thread/unthread left underwear leg;Pull underwear up/down Lower body dressing/undressing: 5: Supervision: Safety issues/verbal cues  FIM - Toileting Toileting: 0: Activity did not occur  FIM - Radio producer Devices: Elevated toilet seat;Grab bars Toilet Transfers: 0-Activity did not occur  FIM - Control and instrumentation engineer Devices: HOB elevated;Bed rails;Arm rests Bed/Chair Transfer: 5: Chair or W/C > Bed: Supervision (verbal cues/safety issues);6: Sit > Supine: No assist (a-p transfer)  FIM - Locomotion: Wheelchair Locomotion: Wheelchair: 6: Travels 150 ft or more, turns around, maneuvers to table, bed or toilet, negotiates 3% grade: maneuvers on rugs and over door sills independently FIM - Locomotion: Ambulation  Locomotion: Ambulation: 0:  Activity did not occur  Comprehension Comprehension Mode: Auditory Comprehension: 7-Follows complex conversation/direction: With no assist  Expression Expression Mode: Verbal Expression: 7-Expresses complex ideas: With no assist  Social Interaction Social Interaction: 6-Interacts appropriately with others with medication or extra time (anti-anxiety, antidepressant).  Problem Solving Problem Solving: 7-Solves complex problems: Recognizes & self-corrects  Memory Memory: 7-Complete Independence: No helper  Medical Problem List and Plan:  1. Bilateral BKA secondary to extensive gangrene/calciphylaxis 07/30/2013   -remove dressings  thursday 2. DVT Prophylaxis/Anticoagulation: Aspirin  3. Pain Management: Neurontin 300 mg 3 times a day, Percocet and Robaxin as needed. Monitor with increased mobility  4. Mood/depression. Zoloft 50 mg daily. Provide emotional support  5. Neuropsych: This patient is capable of making decisions on his own behalf.  6. End-stage renal disease. Continue hemodialysis as per renal services  7. Diabetes mellitus with peripheral neuropathy. Latest hemoglobin A1c 6.7. NovoLog 3 units 3 times a day, Lantus insulin 10 units each bedtime-consider decrease---for now HS snack  - Otherwise he has had   good control 8. History of atrial fibrillation. No anticoagulations secondary to suspect Coumadin necrosis. Cardiac rate controlled  9. Acute on chronic anemia. Latest hemoglobin 8.2. Continue Aranesp  10. Hyperlipidemia. Lipitor  11. GERD. Protonix 12. Wound care: remove dressings today. New dressing type pending appearance of wounds  -coban wrap right thumb, can apply prn -thumb improving-   -pt may shower 13. Leukocytosis: 13.8 . No signs of infection at present  LOS (Days) 9 A FACE TO FACE EVALUATION WAS PERFORMED  Paul [name] T 08/12/2013 7:38 AM

## 2013-08-12 NOTE — Progress Notes (Signed)
Physical Therapy Session Note  Patient Details  Name: Paul [name] MRN: [id number] Date of Birth: 1955-08-24  Today's Date: 08/12/2013 Time: [id number] Time Calculation (min): 54 min  Short Term Goals: Week 1:  PT Short Term Goal 1 (Week 1): Pt will perform bed mobility with supervision PT Short Term Goal 2 (Week 1): Pt will perform bed to w/c transfer with supervision PT Short Term Goal 3 (Week 1): Pt will perform car transfer with Mod A  Skilled Therapeutic Interventions/Progress Updates:    Session consisted of pt performing a-p transfers bed<>w/c with Mod I (pt with improved UE and hip activation), w/c propulsion room<>entry to hospital with Mod I (pt demonstrating good propulsion speed and ability to maneuver individuals walking without cueing) in order to improve functional independence, and w/c mobility outside with Mod I- Min A (assistance required when pt was climbing a medium grade incline) with cueing needed when climbing incline for forward trunk lean and proper hand positioning on wheels to properly propel w/c in order to improve pt's community w/c mobility. Pt and wife were also instructed on proper compression wrapping techniques for residual limbs and purpose behind this compression wrapping at the end of the session with pt's wife demonstrating understanding of this technique (wife verbalizing that she would like more practice with this technique tomorrow) and pt verbally understanding this technique.  Therapy Documentation Precautions:  Precautions Precautions: Fall Precaution Booklet Issued: No Restrictions Weight Bearing Restrictions: Yes RLE Weight Bearing: Non weight bearing LLE Weight Bearing: Non weight bearing Pain: Pt with no c/o pain throughout the session.   See FIM for current functional status  Therapy/Group: Individual Therapy  Tramane Gorum 08/12/2013, 4:55 PM

## 2013-08-12 NOTE — Progress Notes (Signed)
Occupational Therapy Session Notes  Patient Details  Name: Paul [name] MRN: [id number] Date of Birth: Dec 16, 1955  Today's Date: 08/12/2013  Short Term Goals: Week 1:  OT Short Term Goal 1 (Week 1): Patient will perform toilet transfer with minimal assistance using DME OT Short Term Goal 2 (Week 1): Patient will complete LB dressing(pants only) with minimal assistance OT Short Term Goal 3 (Week 1): Patient will be educated on a BUE strengthening HEP in order to increase overall independence with functional transfers  Skilled Therapeutic Interventions/Progress Updates:   Session #1 0930-1000 - 30 Minutes Patient missed 30 minutes of skilled OT secondary to DR present to address wound/dressing > bilateral residual limbs No complaints of pain Upon entering room, patient found supine in bed with wife present at bed side. Patient with bilateral residual limbs exposed. Dr present to don dressing and ace wrap for compression, therefore patient missed 30 minutes of skilled OT. After dressing change, patient engaged in bed mobility, sat EOB, and prepared bilateral residual limbs for shower. Patient then transferred into w/c, then to bathroom for shower stall transfer, and UB/LB bathing. [name] present and assisting patient prn throughout session. Education > wife and patient completed for BADLs. Patient uses slide board prn and appropriately, normally using slide board for bed <> w/c transfers and occasionally using slide board for w/c <> tub bench transfers. Patient is able to independently direct care prn and set-up w/c for transfers safely and effectively. Patient performed LB dressing while seated on bench, then patient transferred back to w/c using slide board. At end of session, left patient seated in w/c with wife present.   Session #2 1100-1300 - 120 minutes Co-Treatment with TR No complaints of pain Community outing > [employer] focusing on w/c mobility/propulsion on uneven surfaces,  education regarding bathroom location and accessibility, education regarding toilet transfers, overall activity tolerance/endurance, and family education > patient & patient's wife regarding community re-entry aspects. Patient overall mod I> supervision during outing. Patient propelled self back to room at mod I level at end of session. See community outing goal sheet for more information regarding goals during outing; patient met all goals.   Precautions:  Precautions Precautions: Fall Precaution Booklet Issued: No Restrictions Weight Bearing Restrictions: Yes RLE Weight Bearing: Non weight bearing LLE Weight Bearing: Non weight bearing  See FIM for current functional status  Elfego Giammarino 08/12/2013, 7:25 AM

## 2013-08-13 ENCOUNTER — Encounter (HOSPITAL_COMMUNITY): Payer: Managed Care, Other (non HMO) | Admitting: Occupational Therapy

## 2013-08-13 ENCOUNTER — Inpatient Hospital Stay (HOSPITAL_COMMUNITY): Payer: Managed Care, Other (non HMO)

## 2013-08-13 ENCOUNTER — Inpatient Hospital Stay (HOSPITAL_COMMUNITY): Payer: Managed Care, Other (non HMO) | Admitting: Occupational Therapy

## 2013-08-13 DIAGNOSIS — I70269 Atherosclerosis of native arteries of extremities with gangrene, unspecified extremity: Secondary | ICD-10-CM

## 2013-08-13 DIAGNOSIS — E1149 Type 2 diabetes mellitus with other diabetic neurological complication: Secondary | ICD-10-CM

## 2013-08-13 DIAGNOSIS — E1142 Type 2 diabetes mellitus with diabetic polyneuropathy: Secondary | ICD-10-CM

## 2013-08-13 DIAGNOSIS — D649 Anemia, unspecified: Secondary | ICD-10-CM

## 2013-08-13 DIAGNOSIS — S88119A Complete traumatic amputation at level between knee and ankle, unspecified lower leg, initial encounter: Secondary | ICD-10-CM

## 2013-08-13 DIAGNOSIS — R1312 Dysphagia, oropharyngeal phase: Secondary | ICD-10-CM

## 2013-08-13 DIAGNOSIS — N186 End stage renal disease: Secondary | ICD-10-CM

## 2013-08-13 LAB — GLUCOSE, CAPILLARY
GLUCOSE-CAPILLARY: 40 mg/dL — AB (ref 70–99)
GLUCOSE-CAPILLARY: 245 mg/dL — AB (ref 70–99)
Glucose-Capillary: 83 mg/dL (ref 70–99)
Glucose-Capillary: 167 mg/dL — ABNORMAL HIGH (ref 70–99)
Glucose-Capillary: 54 mg/dL — ABNORMAL LOW (ref 70–99)
Glucose-Capillary: 87 mg/dL (ref 70–99)
Glucose-Capillary: 127 mg/dL — ABNORMAL HIGH (ref 70–99)

## 2013-08-13 MED ORDER — INSULIN GLARGINE 100 UNIT/ML ~~LOC~~ SOLN: 10.0000 [IU] | Freq: Every day | SUBCUTANEOUS | Status: DC

## 2013-08-13 MED ORDER — CEPHALEXIN 250 MG PO CAPS
250.0000 mg | ORAL_CAPSULE | Freq: Two times a day (BID) | ORAL | Status: DC
  Administered 2013-08-13 – 2013-08-14 (×3): 250 mg via ORAL
  Filled 2013-08-13 (×5): qty 1

## 2013-08-13 MED ORDER — ATORVASTATIN CALCIUM 10 MG PO TABS: 10.0000 mg | ORAL_TABLET | Freq: Every day | ORAL | Status: DC

## 2013-08-13 MED ORDER — ASPIRIN 325 MG PO TBEC
325.0000 mg | DELAYED_RELEASE_TABLET | Freq: Every day | ORAL | Status: DC
Start: 2013-08-13 — End: 2014-09-09

## 2013-08-13 MED ORDER — OMEPRAZOLE 40 MG PO CPDR: 40.0000 mg | DELAYED_RELEASE_CAPSULE | Freq: Every day | ORAL | Status: DC

## 2013-08-13 MED ORDER — METHOCARBAMOL 500 MG PO TABS: 500.0000 mg | ORAL_TABLET | Freq: Four times a day (QID) | ORAL | Status: DC | PRN

## 2013-08-13 MED ORDER — SEVELAMER CARBONATE 800 MG PO TABS: 800.0000 mg | ORAL_TABLET | Freq: Three times a day (TID) | ORAL | Status: DC

## 2013-08-13 MED ORDER — RENA-VITE PO TABS: 1.0000 | ORAL_TABLET | Freq: Every day | ORAL | Status: DC

## 2013-08-13 MED ORDER — OXYCODONE-ACETAMINOPHEN 5-325 MG PO TABS: 1.0000 | ORAL_TABLET | ORAL | Status: DC | PRN

## 2013-08-13 MED ORDER — GABAPENTIN 300 MG PO CAPS: 300.0000 mg | ORAL_CAPSULE | Freq: Three times a day (TID) | ORAL | Status: DC

## 2013-08-13 MED ORDER — CEPHALEXIN 250 MG PO CAPS: 250.0000 mg | ORAL_CAPSULE | Freq: Two times a day (BID) | ORAL | Status: DC

## 2013-08-13 MED ORDER — SERTRALINE HCL 50 MG PO TABS: 50.0000 mg | ORAL_TABLET | Freq: Every day | ORAL | Status: DC

## 2013-08-13 NOTE — Progress Notes (Signed)
Educated patient's wife on dressing changes to bilateral knees with return demonstration. Wife also performed dressing change on Lf. and Rt. buttock wounds. Patient had no c/o of pain. Patient and wife had no questions or concerns at this time. Will continue to monitor.

## 2013-08-13 NOTE — Discharge Instructions (Signed)
Inpatient Rehab Discharge Instructions  Paul [name] Discharge date and time: No discharge date for patient encounter.   Activities/Precautions/ Functional Status: Activity: activity as tolerated Diet: diabetic diet Wound Care: keep wound clean and dry Functional status:  ___ No restrictions     ___ Walk up steps independently _x__ 24/7 supervision/assistance   ___ Walk up steps with assistance ___ Intermittent supervision/assistance  ___ Bathe/dress independently ___ Walk with walker     ___ Bathe/dress with assistance ___ Walk Independently    ___ Shower independently ___ Walk with assistance    ___ Shower with assistance ___ No alcohol     ___ Return to work/school ________   COMMUNITY REFERRALS UPON DISCHARGE:    Home Health:   PT     OT    RN                     Agency: [facility] Phone: [phone]    Medical Equipment/Items Ordered: wheelchair, cushion, transfer board, drop arm commode and tub seat                                                    Agency/Supplier: [facility] @ [phone]   GENERAL COMMUNITY RESOURCES FOR PATIENT/FAMILY:  Support Groups: Amputee Group      Special Instructions: Continue dialysis as directed\    Santyl right buttock wound cover with damp to dry dressing changes daily. Cover buttock with sacral protective dressing change every 3-5 days   My questions have been answered and I understand these instructions. I will adhere to these goals and the provided educational materials after my discharge from the hospital.  Patient/Caregiver Signature _______________________________ Date __________  Clinician Signature _______________________________________ Date __________  Please bring this form and your medication list with you to all your follow-up doctor's appointments.

## 2013-08-13 NOTE — Progress Notes (Signed)
Physical Therapy Session Note  Patient Details  Name: Paul [name] MRN: [id number] Date of Birth: 1955-07-06  Today's Date: 08/13/2013 Time: 7858-8502 Time Calculation (min): 53 min  Short Term Goals: Week 1:  PT Short Term Goal 1 (Week 1): Pt will perform bed mobility with supervision PT Short Term Goal 1 - Progress (Week 1): Met PT Short Term Goal 2 (Week 1): Pt will perform bed to w/c transfer with supervision PT Short Term Goal 2 - Progress (Week 1): Met PT Short Term Goal 3 (Week 1): Pt will perform car transfer with Mod A PT Short Term Goal 3 - Progress (Week 1): Met  Skilled Therapeutic Interventions/Progress Updates:    Session consisted of pt performing a-p transfers w/c<>bed with Mod I (presenting with improved UE and hip activation), slideboard transfer w/c<>car with Min guard (cueing needed for w/c positioning) pt demonstrating improved trunk control throughout, w/c propulsion room<>west elevators with Mod I in order to improve functional independence, and practicing putting dishes into dishwasher and taking food out of the refrigerator in w/c with Mod I (pt able to properly position w/c without cueing) in order to improve pt's safety in the kitchen with pt and wife also being instructed on proper kitchen setup for pt to be able to easily access various items when in w/c (pt and wife verbalizing understanding of this). Session concluded with pt being given a hard copy of his HEP program and performing 1x10 of the following exercises to BLE's: LAQ, SLR, S/L hip abduction, pillow squeezes, glut sets, quad sets, and prone hip extension in order to improve BLE strength and ROM. Pt's wife also demonstrated understanding of proper assistance technique with the slideboard car transfer during the session.   Therapy Documentation Precautions:  Precautions Precautions: Fall Precaution Booklet Issued: No Restrictions Weight Bearing Restrictions: Yes RLE Weight Bearing: Non weight  bearing LLE Weight Bearing: Non weight bearing Other Position/Activity Restrictions: No pillow under knees, patient and wife educated on this Pain:  No c/o pain throughout the session  See FIM for current functional status  Therapy/Group: Individual Therapy  Florenda Watt 08/13/2013, 12:26 PM

## 2013-08-13 NOTE — Progress Notes (Signed)
Physical Therapy Discharge Summary  Patient Details  Name: Paul [name] MRN: [id number] Date of Birth: [birth year]  Today's Date: 08/13/2013  Patient has met 7 of 7 long term goals due to improved activity tolerance, improved balance, improved postural control, increased strength, increased range of motion, decreased pain, ability to compensate for deficits and functional use of  right upper extremity, right lower extremity, left upper extremity and left lower extremity.  Patient to discharge at a wheelchair level Modified Independent with Min A needed for slideboard car transfer.   Patient's care partner is independent to provide the necessary physical assistance for patient to perform slideboard car transfers at discharge.   Recommendation:  Patient will benefit from ongoing skilled PT services in home health setting to continue to advance safe functional mobility, address ongoing impairments in basic transfers, slideboard car transfers, w/c mobility (especially in the community), bed mobility, BLE strength and ROM, core strength, dynamic sitting balance, endurance, minimize fall risk, and to prepare for fitting of prostheses.  Equipment: [phone] wheelchair, bilateral amputee axles, bilateral amputee pads, 16x18 basic cushion, and 30" slideboard (it was also recommended that pt build ramp inside garage for access into home with wife stating that the ramp had been built on 3/27)   Reasons for discharge: treatment goals met and discharge from hospital  Patient/family agrees with progress made and goals achieved: Yes  PT Discharge Precautions/Restrictions Precautions Precautions: Fall Precaution Booklet Issued: No Restrictions Weight Bearing Restrictions: Yes RLE Weight Bearing: Non weight bearing LLE Weight Bearing: Non weight bearing Other Position/Activity Restrictions: No pillow under knees, patient and wife educated on this Cognition Overall Cognitive Status: Within Functional Limits  for tasks assessed Arousal/Alertness: Awake/alert Orientation Level: Oriented X4 Memory: Appears intact Awareness: Appears intact Problem Solving: Appears intact Safety/Judgment: Appears intact Sensation Sensation Light Touch: Appears Intact Coordination Gross Motor Movements are Fluid and Coordinated: Yes Motor  Motor Motor: Within Functional Limits  Trunk/Postural Assessment  Cervical Assessment Cervical Assessment: Within Functional Limits Thoracic Assessment Thoracic Assessment: Within Functional Limits Lumbar Assessment Lumbar Assessment: Within Functional Limits Postural Control Postural Control: Within Functional Limits  Balance Balance Balance Assessed: Yes Static Sitting Balance Static Sitting - Level of Assistance: 6: Modified independent (Device/Increase time) Dynamic Sitting Balance Dynamic Sitting - Level of Assistance: 6: Modified independent (Device/Increase time) Extremity Assessment  RUE Assessment RUE Assessment: Within Functional Limits LUE Assessment LUE Assessment: Within Functional Limits RLE Assessment RLE Assessment: Exceptions to WFL (BKA) RLE AROM (degrees) RLE Overall AROM Comments: able to obtain just short of full knee extension RLE Strength RLE Overall Strength Comments: MMT Grades: Hip Flexion 4+/5, Knee Extension 4+/5, Hip Abduction 4/5 LLE Assessment LLE Assessment: Exceptions to WFL (BKA) LLE AROM (degrees) LLE Overall AROM Comments: able to obtain just short of full knee extension LLE Strength LLE Overall Strength Comments: MMT Grades: Hip Flexion 4+/5, Knee Extension 4+/5, Hip Abduction 4/5  See FIM for current functional status  Yasser Hepp 08/13/2013, 12:38 PM

## 2013-08-13 NOTE — Progress Notes (Signed)
[facility] Progress Note  Subjective:   No c/o's. Anxious for d/c tomorrow  Objective Filed Vitals:   08/12/13 2030 08/12/13 2100 08/12/13 2106 08/13/13 0537  BP: 134/70 137/69 132/60 132/69  Pulse: 69 70 72 68  Temp:   98.4 F (36.9 C) 99.1 F (37.3 C)  TempSrc:   Oral Oral  Resp: 19 20 18 18   Weight:   73.6 kg (162 lb 4.1 oz)   SpO2:   97% 99%   Physical Exam General: Alert, cooperative, NAD Heart: RRR Lungs: CTA bilat, no wheezes or rhonchi Abdomen: Soft, NT + BS Extremities: bilat BKAs, wrapped/ clean dressings, no pretibial edema Dialysis Access: R AVF + bruit  Dialysis: [facility]  4h 81.5kg (new edw ~74kg) 2/2.5 RFA AVF Heparin - resumed as inpt, was on hold due to bleeding leg wounds  Epo 18000 Na thiosulfate 25gm tiw until wounds heal  Assessment/Plan: 1. Gangrene LE's / calciphylaxis, s/p bilat BKA. On Keflex, Na thiosulfate with HD 2. ESRD - TTS, HD first shift in am in anticipation of d/c 3. HTN  - SBPs 130s, off bp meds due to orthostatic hypotension. No overt excess. New EDW ~74kg) UF 2L tomorrow as tolerated 4. Anemia - Hgb 7.9 most recently. On Aranesp 150 q Tues. IV Fe load in progress through 08/21/13. Recheck CBC, transfuse prn 5. MBD  - Ca /Phos controlled. Continue renvela 1. Renal panel pending 6. DM on insulin 7. Yell after HD tomorrow    Collene Leyden. Cletus Gash, PA-C [facility] Pager [phone] 08/13/2013,12:17 PM  LOS: 10 days   Pt seen, examined, agree w assess/plan as above with additions as indicated.  Kelly Splinter MD pager [phone]    cell [phone] 08/13/2013, 1:07 PM     Additional Objective Labs: Basic Metabolic Panel:  Recent Labs Lab 08/07/13 1642 08/10/13 1514 08/12/13 1631  NA 137 137 139  K 4.9 5.6* 5.7*  CL 95* 93* 98  CO2 24 26 26   GLUCOSE 87 238* 76  BUN 71* 71* 63*  CREATININE 5.31* 5.90* 5.63*  CALCIUM 8.8 8.8 9.0  PHOS 2.9  --   --    Liver Function Tests:  Recent Labs Lab  08/07/13 1642  ALBUMIN 1.6*   CBC:  Recent Labs Lab 08/07/13 1642 08/10/13 1514  WBC 13.8* 14.3*  HGB 8.4* 7.9*  HCT 26.4* 25.5*  MCV 88.9 91.1  PLT 479* 489*    CBG:  Recent Labs Lab 08/12/13 1106 08/12/13 1320 08/12/13 2136 08/13/13 0728 08/13/13 1138  GLUCAP 148* 223* 149* 83 167*    Studies/Results: No results found.  Medications:   . antiseptic oral rinse  15 mL Mouth Rinse BID  . aspirin EC  325 mg Oral Daily  . atorvastatin  10 mg Oral q1800  . cephALEXin  250 mg Oral Q12H  . collagenase   Topical Daily  . darbepoetin (ARANESP) injection - DIALYSIS  150 mcg Intravenous Q Tue-HD  . feeding supplement (NEPRO CARB STEADY)  237 mL Oral BID BM  . ferric gluconate (FERRLECIT/NULECIT) IV  125 mg Intravenous Q T,Th,Sa-HD  . gabapentin  300 mg Oral TID  . Gerhardt's butt cream   Topical Daily  . insulin aspart  0-15 Units Subcutaneous TID WC  . insulin aspart  3 Units Subcutaneous TID WC  . insulin glargine  10 Units Subcutaneous QHS  . multivitamin  1 tablet Oral QHS  . pantoprazole  40 mg Oral Daily  . sertraline  50 mg  Oral Daily  . sevelamer carbonate  800 mg Oral TID WC  . sodium thiosulfate infusion for calciphylaxis  25 g Intravenous Q T,Th,Sa-HD

## 2013-08-13 NOTE — Significant Event (Signed)
Hypoglycemic Event  CBG: 54  Treatment: 15 GM carbohydrate snack  Symptoms: Sweaty and Shaky  Follow-up CBG: Time:1506 CBG Result:87  Possible Reasons for Event: Unknown  Comments/MD notified:Dan Anguilli, PA in patient room at present time. Patient currently resting comfortable with no complaints. Will continue to monitor.    Dorthula Nettles G  Remember to initiate Hypoglycemia Order Set & complete

## 2013-08-13 NOTE — Progress Notes (Signed)
Physical Therapy Session Note  Patient Details  Name: Paul [name] MRN: [id number] Date of Birth: [birth year]  Today's Date: 08/13/2013 Time: 6834-1962 Time Calculation (min): 45 min  Short Term Goals: Week 1:  PT Short Term Goal 1 (Week 1): Pt will perform bed mobility with supervision PT Short Term Goal 1 - Progress (Week 1): Met PT Short Term Goal 2 (Week 1): Pt will perform bed to w/c transfer with supervision PT Short Term Goal 2 - Progress (Week 1): Met PT Short Term Goal 3 (Week 1): Pt will perform car transfer with Mod A PT Short Term Goal 3 - Progress (Week 1): Met  Skilled Therapeutic Interventions/Progress Updates:    Session focused on family education with pt and pt's wife in regards to compression wrapping, negotiating ramp with w/c and safety recommendations on steep inclines, reviewed and practiced furniture transfers with slideboard mod I with wife able to demonstrate guarding techniques as needed, and discussed with SW that w/c that arrived from [facility] was incorrect size, did not have removable wheels (necessary for wife to be able to load into the car), and velcro on cushion did not match velcro on w/c to secure. Pt and wife deny any further concerns and state they feel ready for d/c tomorrow.  Therapy Documentation Precautions:  Precautions Precautions: Fall Precaution Booklet Issued: No Restrictions Weight Bearing Restrictions: Yes RLE Weight Bearing: Non weight bearing LLE Weight Bearing: Non weight bearing Other Position/Activity Restrictions: No pillow under knees, patient and wife educated on this  Pain:  Denies pain.  See FIM for current functional status  Therapy/Group: Individual Therapy  Canary Brim [facility] 08/13/2013, 4:16 PM

## 2013-08-13 NOTE — Significant Event (Signed)
Hypoglycemic Event  CBG: 40  Treatment: 15 GM carbohydrate snack  Symptoms: Sweaty and Shaky  Follow-up CBG: Time:1443 CBG Result:54  Possible Reasons for Event: Unknown  Comments/MD notified:Dan McMullen, PA notified. Patient taking 15g carb. snack and and resting comfortably. Will recheck in 15 min.    Dorthula Nettles G  Remember to initiate Hypoglycemia Order Set & complete

## 2013-08-13 NOTE — Progress Notes (Signed)
Subjective/Complaints: No new issues. Denies pain. Low grade temp noted A 12 point review of systems has been performed and if not noted above is otherwise negative.   Objective: Vital Signs: Blood pressure 132/69, pulse 68, temperature 99.1 F (37.3 C), temperature source Oral, resp. rate 18, weight 73.6 kg (162 lb 4.1 oz), SpO2 99.00%. No results found.  Recent Labs  08/10/13 1514  WBC 14.3*  HGB 7.9*  HCT 25.5*  PLT 489*    Recent Labs  08/10/13 1514 08/12/13 1631  NA 137 139  K 5.6* 5.7*  CL 93* 98  GLUCOSE 238* 76  BUN 71* 63*  CREATININE 5.90* 5.63*  CALCIUM 8.8 9.0   CBG (last 3)   Recent Labs  08/12/13 1320 08/12/13 2136 08/13/13 0728  GLUCAP 223* 149* 83    Wt Readings from Last 3 Encounters:  08/12/13 73.6 kg (162 lb 4.1 oz)  08/03/13 76.2 kg (167 lb 15.9 oz)  08/03/13 76.2 kg (167 lb 15.9 oz)    Physical Exam:  Constitutional: He is oriented to person, place, and time. Hair cut, clean  HENT: oral mucosa pink and moist.  Head: Normocephalic.  Eyes: EOM are normal.  Neck: Normal range of motion. Neck supple. No thyromegaly present.  Cardiovascular: Normal rate and regular rhythm. No murmurs, rubs, or gallops  Respiratory: Effort normal and breath sounds normal. No respiratory distress. No wheezes, rales, or rhonchi  GI: Soft. Bowel sounds are normal. He exhibits no distension.  Neurological: He is alert and oriented to person, place, and time. No cranial nerve deficit. Coordination normal.  Patient follows full commands. Deltoids 4+/5. Biceps, triceps, and hands 4+ to 5/5. LE's HF 3+/5, KE3+ /KF 3+    Skin:  bka incisions with staples, well approximated. Wounds with dark central areas which appears to be necrotic tissue/old blood. Surrounding mild erythema also  -small 1.5cm stage 2 on left gluteal fold---clean, minimal drainage FND Psychiatric: He has a normal mood and affect. His behavior is normal. Thought content  normal   Assessment/Plan: 1. Functional deficits secondary to bilateral BKA's which require 3+ hours per day of interdisciplinary therapy in a comprehensive inpatient rehab setting. Physiatrist is providing close team supervision and 24 hour management of active medical problems listed below. Physiatrist and rehab team continue to assess barriers to discharge/monitor patient progress toward functional and medical goals.    [employer] for education/ support  FIM: FIM - Bathing Bathing Steps Patient Completed: Chest;Right Arm;Left Arm;Abdomen;Front perineal area;Buttocks;Right upper leg;Left upper leg Bathing: 6: Assistive device (Comment)  FIM - Upper Body Dressing/Undressing Upper body dressing/undressing steps patient completed: Thread/unthread right sleeve of pullover shirt/dresss;Thread/unthread left sleeve of pullover shirt/dress;Put head through opening of pull over shirt/dress;Pull shirt over trunk Upper body dressing/undressing: 5: Set-up assist to: Obtain clothing/put away FIM - Lower Body Dressing/Undressing Lower body dressing/undressing steps patient completed: Thread/unthread right pants leg;Thread/unthread left pants leg;Pull pants up/down;Thread/unthread right underwear leg;Thread/unthread left underwear leg;Pull underwear up/down Lower body dressing/undressing: 5: Set-up assist to: Obtain clothing  FIM - Toileting Toileting: 0: Activity did not occur  FIM - Radio producer Devices: Recruitment consultant Transfers: 4-To toilet/BSC: Min A (steadying Pt. > 75%)  FIM - Bed/Chair Transfer Bed/Chair Transfer Assistive Devices: Arm rests;Bed rails Bed/Chair Transfer: 6: Supine > Sit: No assist;6: Bed > Chair or W/C: No assist;6: Chair or W/C > Bed: No assist (a-p transfer)  FIM - Locomotion: Wheelchair Locomotion: Wheelchair: 6: Travels 150 ft  or more, turns around, maneuvers to table, bed or toilet, negotiates 3% grade: maneuvers on rugs and  over door sills independently FIM - Locomotion: Ambulation Locomotion: Ambulation: 0: Activity did not occur  Comprehension Comprehension Mode: Auditory Comprehension: 7-Follows complex conversation/direction: With no assist  Expression Expression Mode: Verbal Expression: 7-Expresses complex ideas: With no assist  Social Interaction Social Interaction: 6-Interacts appropriately with others with medication or extra time (anti-anxiety, antidepressant). (on Zoloft)  Problem Solving Problem Solving: 7-Solves complex problems: Recognizes & self-corrects  Memory Memory: 7-Complete Independence: No helper  Medical Problem List and Plan:  1. Bilateral BKA secondary to extensive gangrene/calciphylaxis 07/30/2013   -remove dressings  thursday 2. DVT Prophylaxis/Anticoagulation: Aspirin  3. Pain Management: Neurontin 300 mg 3 times a day, Percocet and Robaxin as needed. Monitor with increased mobility  4. Mood/depression. Zoloft 50 mg daily. Provide emotional support  5. Neuropsych: This patient is capable of making decisions on his own behalf.  6. End-stage renal disease. Continue hemodialysis as per renal services  7. Diabetes mellitus with peripheral neuropathy. Latest hemoglobin A1c 6.7. NovoLog 3 units 3 times a day, Lantus insulin 10 units each bedtime-consider decrease---for now HS snack  - Otherwise he has had   good control 8. History of atrial fibrillation. No anticoagulations secondary to suspect Coumadin necrosis. Cardiac rate controlled  9. Acute on chronic anemia. Latest hemoglobin 8.2. Continue Aranesp  10. Hyperlipidemia. Lipitor  11. GERD. Protonix 12. Wound care: daily dressings  -area of ischemia/necrotic tissue along incision line on both stumps  -notified ortho who came by to see pt today (did not look at wounds however)  -would not use stump shrinkers until further healing of legs  -will add keflex for covg  -coban wrap right thumb, can apply prn -thumb improving-    -pt may shower with limbs covered 13. Leukocytosis: 13.8 . No signs of infection at present  LOS (Days) 10 A FACE TO FACE EVALUATION WAS PERFORMED  Janaisha Tolsma T 08/13/2013 7:33 AM

## 2013-08-13 NOTE — Discharge Summary (Signed)
NAME:  Paul, [name] NO.:  [id number]  MEDICAL RECORD NO.:  [id number]  LOCATION:  4M11C                        FACILITY:  [facility]  PHYSICIAN:  Meredith Staggers, M.D.DATE OF BIRTH:  11-23-1955  DATE OF ADMISSION:  08/03/2013 DATE OF DISCHARGE:  08/14/2013                              DISCHARGE SUMMARY   DISCHARGE DIAGNOSES: 1. Bilateral below-knee amputation secondary to extensive gangrenous     changes - calciphylaxis. 2. Aspirin for deep venous thrombosis prophylaxis. 3. Pain management. 4. Depression. 5. End-stage renal disease with hemodialysis. 6. Diabetes mellitus with peripheral neuropathy. 7. History of atrial fibrillation. 8. Acute on chronic anemia. 9. Hyperlipidemia. 10.Gastroesophageal reflux disease. 11.Leukocytosis.  No signs of infection.  HISTORY OF PRESENT ILLNESS:  This is a 58 year old right-handed male with history of end-stage renal disease, hemodialysis as well as diabetes mellitus with peripheral neuropathy, history of atrial fibrillation with Coumadin discontinued secondary to question Coumadin necrosis.  The patient received inpatient rehab services in January 2015 for multifactorial gait disorder related to diabetes mellitus.  He was discharged to home, ambulating with a walker.  Admitted on July 30, 2013, with extensive gangrenous bilateral lower extremity changes followed at the wound center.  Limb was not felt to be salvageable. Underwent bilateral lower extremity below-knee amputation on July 30, 2013, per Dr. Sharol Given.  Postoperative pain management.  Hemodialysis ongoing per Renal Services.  Acute on chronic anemia and monitored with dialysis.  Physical and occupational therapy ongoing.  The patient was admitted for comprehensive rehab program.  PAST MEDICAL HISTORY:  See discharge diagnoses.  SOCIAL HISTORY:  Lives with spouse.  FUNCTIONAL HISTORY:  Prior to admission independent with a rolling walker.  FUNCTIONAL  STATUS:  Upon admission to rehab services was minimum to moderate assist for basic transfers and bed mobility.  PHYSICAL EXAMINATION:  VITAL SIGNS:  Blood pressure 136/66, pulse 84, temperature 100.2, respirations 18. GENERAL:  This was an alert male, oriented x3. HEENT:  Pupils were round and reactive to light.  No complaints. LUNGS:  Clear to auscultation. CARDIAC:  Rate controlled. ABDOMEN:  Soft, nontender.  Good bowel sounds. EXTREMITIES:  Bilateral below-knee amputation site dressed with immediate postoperative Coban dressing.  REHABILITATION HOSPITAL COURSE:  The patient was admitted to inpatient rehab services with therapies initiated on a 3-hour daily basis consisting of physical therapy, occupational therapy, and rehabilitation nursing.  The following issues were addressed during the patient's rehabilitation stay.  Pertaining to Mr. [name] bilateral below-knee amputations secondary to extensive gangrenous changes, dressing changes, immediate postoperative had been removed, staples intact.  He did have some redness at the incision line.  No drainage or odor.  Orthopedic Services, Dr. Sharol Given was notified for followup prior to patient's departure from the hospital for ongoing wound care.  Pain management with the use of Neurontin as well as Percocet and Robaxin for breakthrough pain.  He did remain on Zoloft for history of depression with emotional support provided.  He was participating fully with his therapies.  Dialysis ongoing as per Renal Services arranged to be ongoing as an outpatient.  He did have a history of diabetes mellitus, peripheral neuropathy, hemoglobin A1c 6.7 with insulin therapy as  directed.  He did have a history of atrial fibrillation.  He had been on Coumadin in the past, discontinued secondary to suspect Coumadin necrosis.  His cardiac rate remained controlled.  No chest pain or shortness of breath.  Acute on chronic anemia.  No bleeding episodes. Latest  hemoglobin 7.9, monitored with dialysis.  He did have mild elevation in his white blood cell count, again monitored closely 14,000. He remained asymptomatic.  No fever.  The patient received weekly collaborative interdisciplinary team conferences to discuss estimated length of stay, family teaching, and any barriers to discharge.  He was modified independent for transfers, bed to wheelchair as well as propelling his wheelchair demonstrating good propulsion, speed, ability, and safety.  He was modified independent to minimal assist out in the community for wheelchair mobility, needing some assistance when climbing in up a median grade incline.  Activities of daily living.  He could gather his belongings transfers into wheelchair and into the bathroom for shower stall transfers, upper and lower body bathing independently. Full family teaching was completed.  Plan was to be discharged to home.  DISCHARGE MEDICATIONS: 1. Aspirin 325 mg p.o. daily. 2. Lipitor 10 mg p.o. daily. 3. Aranesp with dialysis as advised. 4. Neurontin 300 mg p.o. t.i.d. 5. NovoLog 3 units t.i.d. 6. Lantus insulin 10 units subcutaneously at bedtime. 7. Robaxin 500 mg p.o. every 6 hours as needed muscle spasms. 8. Rena-Vite 1 tablet p.o. at bedtime. 9. Percocet 1-2 tablets every 4 hours as needed pain, dispense of 90     tablets. 10.Protonix 40 mg p.o. daily. 11.Zoloft 50 mg daily. 12.Renvela 800 mg p.o. t.i.d. 13.keflex 250mg  bid DIET:  Diabetic, renal diet.  SPECIAL INSTRUCTIONS:  Santyl apply to right buttock wound for protective dressing change every 3-5 days.  The patient follow up with Dr. Alger Simons at the outpatient rehab service office on Oct 06, 2013; Dr. Meridee Score 2 weeks call for appointment, Dr. Jonnie Finner, renal dialysis; Dr. Dorthy Cooler, medical management.     Lauraine Rinne, P.A.   ______________________________ Meredith Staggers, M.D.    DA/MEDQ  D:  08/13/2013  T:  08/13/2013  Job:   [id number]  cc:   Meredith Staggers, M.D. Newt Minion, MD Sol Blazing, M.D. [name], MD

## 2013-08-13 NOTE — Progress Notes (Signed)
Reviewed and in agreement with discharge assessment provided.  

## 2013-08-13 NOTE — Progress Notes (Signed)
Occupational Therapy Session Notes & Discharge Summary  Patient Details  Name: Paul [name] MRN: [id number] Date of Birth: 12/10/1955  Today's Date: 08/13/2013  SESSION NOTES  Session #1 [phone] - 82 Minutes Individual Therapy No complaints of pain Upon entering room, patient found supine in bed asleep. Patient engaged in bed mobility independently, covered BLEs for showering, then transferred posteriorly > w/c independently. From here, patient propelled self into shower and transferred onto tub transfer bench with supervision, then performed UB/LB bathing at mod I>supervision level. LB dressing completed at mod I level after drying off in shower, then patient transferred back to w/c using slide board with supervision. Wife provided the supervision as needed for bathing & dressing tasks. Patient then sat at sink for grooming tasks of shaving and brushing teeth. Patient and wife educated on ADL aspects and are independently performing tasks without help/assistance from therapist. Patient removed shower coverings from BLEs and transferred back to bed for RN to change dressings > buttock and bilateral residual limbs. Therapist started education regarding hands-on compression wrapping. Left patient in bed with RN and wife present.   Session #2 1130-1200 - 30 Minutes Individual Therapy No complaints of pain Focused skilled intervention on education > patient and patient's wife on compression wrapping. Patient and wife independent with this task. Patient eager to go home. Patient mod I within room.   --------------------------------------------------------------------------------------------------------------------  DISCHARGE SUMMARY Patient has met 9 of 9 long term goals due to improved activity tolerance, improved balance, postural control, ability to compensate for deficits, improved attention, improved awareness and improved coordination.  Patient to discharge at overall Modified Independent  level.  Patient's care partner is independent to provide the necessary supervision prn assistance at discharge.    Reasons goals not met: n/a, all goals met at this time.   Recommendation:  Patient will benefit from ongoing skilled OT services in home health setting to continue to advance functional skills in the area of BADL, iADL and Reduce care partner burden.  Equipment: drop arm BSC and tub transfer bench  Reasons for discharge: treatment goals met and discharge from hospital  Patient/family agrees with progress made and goals achieved: Yes  Precautions/Restrictions  Precautions Precautions: Fall Precaution Booklet Issued: No Restrictions Weight Bearing Restrictions: Yes RLE Weight Bearing: Non weight bearing LLE Weight Bearing: Non weight bearing Other Position/Activity Restrictions: No pillow under knees, patient and wife educated on this  ADL - See FIM  Vision/Perception  Vision - History Baseline Vision: Bifocals Patient Visual Report: No change from baseline Vision - Assessment Eye Alignment: Within Functional Limits Perception Perception: Within Functional Limits Praxis Praxis: Intact   Cognition Overall Cognitive Status: Within Functional Limits for tasks assessed Arousal/Alertness: Awake/alert Orientation Level: Oriented X4 Memory: Appears intact Awareness: Appears intact Problem Solving: Appears intact Safety/Judgment: Appears intact  Sensation Sensation Light Touch: Appears Intact Coordination Gross Motor Movements are Fluid and Coordinated: Yes Fine Motor Movements are Fluid and Coordinated: Yes  Motor  Motor Motor: Within Functional Limits  Trunk/Postural Assessment  Cervical Assessment Cervical Assessment: Within Functional Limits Thoracic Assessment Thoracic Assessment: Within Functional Limits Lumbar Assessment Lumbar Assessment: Within Functional Limits Postural Control Postural Control: Within Functional Limits   Balance -  Patient mod I for sitting balance  Extremity/Trunk Assessment RUE Assessment RUE Assessment: Within Functional Limits LUE Assessment LUE Assessment: Within Functional Limits  See FIM for current functional status  Paul [name] 08/13/2013, 12:10 PM

## 2013-08-13 NOTE — Discharge Summary (Signed)
Discharge summary job [id number]

## 2013-08-13 NOTE — Progress Notes (Signed)
Reviewed and in agreement with treatment provided.  

## 2013-08-14 DIAGNOSIS — N186 End stage renal disease: Secondary | ICD-10-CM

## 2013-08-14 DIAGNOSIS — S88119A Complete traumatic amputation at level between knee and ankle, unspecified lower leg, initial encounter: Secondary | ICD-10-CM

## 2013-08-14 DIAGNOSIS — I739 Peripheral vascular disease, unspecified: Secondary | ICD-10-CM

## 2013-08-14 DIAGNOSIS — L98499 Non-pressure chronic ulcer of skin of other sites with unspecified severity: Secondary | ICD-10-CM

## 2013-08-14 DIAGNOSIS — D62 Acute posthemorrhagic anemia: Secondary | ICD-10-CM

## 2013-08-14 DIAGNOSIS — E119 Type 2 diabetes mellitus without complications: Secondary | ICD-10-CM

## 2013-08-14 DIAGNOSIS — I4891 Unspecified atrial fibrillation: Secondary | ICD-10-CM

## 2013-08-14 DIAGNOSIS — Z992 Dependence on renal dialysis: Secondary | ICD-10-CM

## 2013-08-14 LAB — CBC
HCT: 26.6 % — ABNORMAL LOW (ref 39.0–52.0)
HEMOGLOBIN: 8 g/dL — AB (ref 13.0–17.0)
MCH: 28.2 pg (ref 26.0–34.0)
MCHC: 30.1 g/dL (ref 30.0–36.0)
MCV: 93.7 fL (ref 78.0–100.0)
PLATELETS: 370 10*3/uL (ref 150–400)
RBC: 2.84 MIL/uL — ABNORMAL LOW (ref 4.22–5.81)
RDW: 18.2 % — ABNORMAL HIGH (ref 11.5–15.5)
WBC: 9.3 10*3/uL (ref 4.0–10.5)

## 2013-08-14 LAB — RENAL FUNCTION PANEL
Albumin: 1.8 g/dL — ABNORMAL LOW (ref 3.5–5.2)
BUN: 49 mg/dL — ABNORMAL HIGH (ref 6–23)
CALCIUM: 8.8 mg/dL (ref 8.4–10.5)
CO2: 27 meq/L (ref 19–32)
Chloride: 98 mEq/L (ref 96–112)
Creatinine, Ser: 4.49 mg/dL — ABNORMAL HIGH (ref 0.50–1.35)
GFR calc Af Amer: 15 mL/min — ABNORMAL LOW (ref >90)
GFR, EST NON AFRICAN AMERICAN: 13 mL/min — AB (ref >90)
Glucose, Bld: 132 mg/dL — ABNORMAL HIGH (ref 70–99)
POTASSIUM: 5.5 meq/L — AB (ref 3.7–5.3)
Phosphorus: 4 mg/dL (ref 2.3–4.6)
SODIUM: 140 meq/L (ref 137–147)

## 2013-08-14 LAB — GLUCOSE, CAPILLARY: GLUCOSE-CAPILLARY: 144 mg/dL — AB (ref 70–99)

## 2013-08-14 NOTE — Procedures (Signed)
I was present at this dialysis session, have reviewed the session itself and made  appropriate changes.  Paul [name] is stable from renal standpoint, going home today after dialysis.  Will be getting home PT and will be fitted soon with bilat LE prostheses after the wounds are fully healed.    Kelly Splinter MD (pgr) [phone]    ([phone] 08/14/2013, 9:24 AM

## 2013-08-14 NOTE — Progress Notes (Signed)
Paul [name] is a 58 y.o. male September 12, 1955 [id number]  Subjective: No new complaints. No new problems. Slept well. Feeling OK.  Objective: Vital signs in last 24 hours: Temp:  [98 F (36.7 C)-98.2 F (36.8 C)] 98.2 F (36.8 C) (03/28 0700) Pulse Rate:  [64-78] 71 (03/28 0930) Resp:  [17-18] 18 (03/28 0706) BP: (120-142)/(60-76) 121/66 mmHg (03/28 0930) SpO2:  [95 %-97 %] 97 % (03/28 0700) Weight:  [164 lb 0.4 oz (74.4 kg)] 164 lb 0.4 oz (74.4 kg) (03/28 0700) Weight change: -5 lb 1.1 oz (-2.3 kg) Last BM Date: 08/12/13  Intake/Output from previous day: 03/27 0701 - 03/28 0700 In: 240 [P.O.:240] Out: -  Last cbgs: CBG (last 3)   Recent Labs  08/13/13 1506 08/13/13 1701 08/13/13 2052  GLUCAP 87 245* 127*     Physical Exam General: No apparent distress  Pt was seen at HD HEENT: not dry Lungs: Normal effort. Lungs clear to auscultation, no crackles or wheezes. Cardiovascular: Regular rate and rhythm, no edema Abdomen: S/NT/ND; BS(+) Musculoskeletal:  Unchanged B BKA Neurological: No new neurological deficits Wounds: N/A    Skin: clear  Aging changes Mental state: Alert, oriented, cooperative    Lab Results: BMET    Component Value Date/Time   NA 140 08/14/2013 0715   K 5.5* 08/14/2013 0715   CL 98 08/14/2013 0715   CO2 27 08/14/2013 0715   GLUCOSE 132* 08/14/2013 0715   BUN 49* 08/14/2013 0715   CREATININE 4.49* 08/14/2013 0715   CALCIUM 8.8 08/14/2013 0715   GFRNONAA 13* 08/14/2013 0715   GFRAA 15* 08/14/2013 0715   CBC    Component Value Date/Time   WBC 9.3 08/14/2013 0715   WBC 5.5 07/07/2012 0910   RBC 2.84* 08/14/2013 0715   RBC 3.58* 07/07/2012 0910   HGB 8.0* 08/14/2013 0715   HGB 11.3* 07/07/2012 0910   HCT 26.6* 08/14/2013 0715   HCT 34.7* 07/07/2012 0910   PLT 370 08/14/2013 0715   PLT 95* 07/07/2012 0910   MCV 93.7 08/14/2013 0715   MCV 96.9 07/07/2012 0910   MCH 28.2 08/14/2013 0715   MCH 31.6 07/07/2012 0910   MCHC 30.1 08/14/2013 0715   MCHC 32.6  07/07/2012 0910   RDW 18.2* 08/14/2013 0715   RDW 13.3 07/07/2012 0910   LYMPHSABS 1.2 07/09/2013 1759   LYMPHSABS 1.2 07/07/2012 0910   MONOABS 1.6* 07/09/2013 1759   MONOABS 0.5 07/07/2012 0910   EOSABS 0.1 07/09/2013 1759   EOSABS 0.0 07/07/2012 0910   BASOSABS 0.0 07/09/2013 1759   BASOSABS 0.0 07/07/2012 0910    Studies/Results: No results found.  Medications: I have reviewed the patient's current medications.  Assessment/Plan:   1. Bilateral BKA secondary to extensive gangrene/calciphylaxis 07/30/2013  -remove dressings thursday  2. DVT Prophylaxis/Anticoagulation: Aspirin  3. Pain Management: Neurontin 300 mg 3 times a day, Percocet and Robaxin as needed. Monitor with increased mobility  4. Mood/depression. Zoloft 50 mg daily. Provide emotional support  5. Neuropsych: This patient is capable of making decisions on his own behalf.  6. End-stage renal disease. Continue hemodialysis as per renal services  7. Diabetes mellitus with peripheral neuropathy. Latest hemoglobin A1c 6.7. NovoLog 3 units 3 times a day, Lantus insulin 10 units each bedtime-consider decrease---for now HS snack  - Otherwise he has had good control  8. History of atrial fibrillation. No anticoagulations secondary to suspect Coumadin necrosis. Cardiac rate controlled  9. Acute on chronic anemia. Latest hemoglobin 8.2. Continue Aranesp  10. Hyperlipidemia. Lipitor  11. GERD. Protonix  12. Wound care: daily dressings  -area of ischemia/necrotic tissue along incision line on both stumps  -notified ortho who came by to see pt today (did not look at wounds however)  -would not use stump shrinkers until further healing of legs  -will add keflex for covg  -coban wrap right thumb, can apply prn -thumb improving-  -pt may shower with limbs covered  13. Leukocytosis: 13.8 . No signs of infection at present  D/c home    Length of stay, days: [place] , MD 08/14/2013, 9:57 AM

## 2013-08-14 NOTE — Progress Notes (Signed)
Patient discharged about 1400 to home with wife. Patient discharged with all belongings and discharge instructions given via Silvestre Mesi PA yesterday. Patient denied any questions. Patients dressings changed this afternoon with wife there to observe. Extra supplies sent with wife until home health nurse arrives. Patient had advanced home care wheelchair. Extra velcro added to seat to attach cushion. New cushion to be shipped to patient's home. Patient vitals stable post dialysis and patient ate lunch before leaving.

## 2013-08-16 ENCOUNTER — Encounter: Payer: Managed Care, Other (non HMO) | Admitting: Physician Assistant

## 2013-08-16 ENCOUNTER — Encounter: Payer: Self-pay | Admitting: Physician Assistant

## 2013-08-16 NOTE — Progress Notes (Signed)
Social Work  Discharge Note  The overall goal for the admission was met for:   Discharge location: Yes - home with wife who can provide 24/7 assistance  Length of Stay: Yes - 11 days  Discharge activity level: Yes - mod independent w/c level  Home/community participation: Yes  Services provided included: MD, RD, PT, OT, RN, TR, Pharmacy and SW  Financial Services: Private Insurance: [employer]  Follow-up services arranged: Home Health: RN, PT, OT via [place], DME: [phone] lightweight w/c with amputee axle and bil. amputee support pads, cushion, drop arm commode, tub seat and 30" transfer board all via [place], Other: provided info on local Amputee Support Group and Patient/Family has no preference for HH/DME agencies  Comments (or additional information):  Patient/Family verbalized understanding of follow-up arrangements: Yes  Individual responsible for coordination of the follow-up plan: patient  Confirmed correct DME delivered: Jehan Ranganathan 08/16/2013    [place], [place]

## 2013-08-17 NOTE — Progress Notes (Signed)
This encounter was created in error - please disregard.

## 2013-08-18 ENCOUNTER — Ambulatory Visit: Payer: Managed Care, Other (non HMO) | Admitting: Physical Therapy

## 2013-08-20 ENCOUNTER — Encounter: Payer: Self-pay | Admitting: Physician Assistant

## 2013-08-20 ENCOUNTER — Ambulatory Visit (INDEPENDENT_AMBULATORY_CARE_PROVIDER_SITE_OTHER): Payer: Managed Care, Other (non HMO) | Admitting: Physician Assistant

## 2013-08-20 ENCOUNTER — Encounter (HOSPITAL_BASED_OUTPATIENT_CLINIC_OR_DEPARTMENT_OTHER): Payer: Managed Care, Other (non HMO) | Attending: General Surgery

## 2013-08-20 VITALS — BP 121/61 | HR 63

## 2013-08-20 DIAGNOSIS — Z992 Dependence on renal dialysis: Secondary | ICD-10-CM

## 2013-08-20 DIAGNOSIS — N186 End stage renal disease: Secondary | ICD-10-CM

## 2013-08-20 DIAGNOSIS — L03119 Cellulitis of unspecified part of limb: Secondary | ICD-10-CM

## 2013-08-20 DIAGNOSIS — S88119A Complete traumatic amputation at level between knee and ankle, unspecified lower leg, initial encounter: Secondary | ICD-10-CM | POA: Insufficient documentation

## 2013-08-20 DIAGNOSIS — T85898A Other specified complication of other internal prosthetic devices, implants and grafts, initial encounter: Secondary | ICD-10-CM | POA: Insufficient documentation

## 2013-08-20 DIAGNOSIS — Y835 Amputation of limb(s) as the cause of abnormal reaction of the patient, or of later complication, without mention of misadventure at the time of the procedure: Secondary | ICD-10-CM | POA: Insufficient documentation

## 2013-08-20 DIAGNOSIS — Z89512 Acquired absence of left leg below knee: Secondary | ICD-10-CM

## 2013-08-20 DIAGNOSIS — N19 Unspecified kidney failure: Secondary | ICD-10-CM | POA: Insufficient documentation

## 2013-08-20 DIAGNOSIS — I96 Gangrene, not elsewhere classified: Secondary | ICD-10-CM | POA: Insufficient documentation

## 2013-08-20 DIAGNOSIS — L02419 Cutaneous abscess of limb, unspecified: Secondary | ICD-10-CM | POA: Insufficient documentation

## 2013-08-20 DIAGNOSIS — I4891 Unspecified atrial fibrillation: Secondary | ICD-10-CM

## 2013-08-20 DIAGNOSIS — Z89511 Acquired absence of right leg below knee: Secondary | ICD-10-CM

## 2013-08-20 DIAGNOSIS — E1159 Type 2 diabetes mellitus with other circulatory complications: Secondary | ICD-10-CM | POA: Insufficient documentation

## 2013-08-20 NOTE — Progress Notes (Signed)
[place], [employer] [place], [place]  [address] Phone: [phone] Fax:  [phone]  Date:  08/20/2013   ID:  Paul [name], Paul [name] 14-Feb-1956, MRN [id number]  PCP:  [name], MD  Cardiologist:  Dr. Peter Martinique     History of Present Illness: Paul [name] is a 58 y.o. male with a hx of AFib, DM, ESRD on peritoneal dialysis, duodenal CA, GERD, depression.  He has no reported CAD.  He has had several admissions over the last few months with syncope 2/2 orthostasis related to dialysis as well as LE gangrene and calciphylaxis.  His Coumadin was stopped in 06/2013 due to concerns for coumadin necrosis.  He ultimately underwent bilateral BKA last month.  He was d/c from rehab 3/28.  He returns for follow up.  The patient denies palpitations, chest pain, shortness of breath, syncope, orthopnea, PND or significant pedal edema.    Studies:  - Echo (07/13/13):  EF 55-60%, normal motion, mild LAE  - Carotid US (07/11/13):  Bilateral ICA 1-39%.   Recent Labs: 04/02/2013: Pro B Natriuretic peptide (BNP) 12548.0*  04/03/2013: HDL Cholesterol 33*; LDL (calc) 38  05/24/2013: ALT 21  07/10/2013: TSH 2.188  08/14/2013: Creatinine 4.49*; Hemoglobin 8.0*; Potassium 5.5*   Wt Readings from Last 3 Encounters:  08/14/13 159 lb 13.3 oz (72.5 kg)  08/03/13 167 lb 15.9 oz (76.2 kg)  08/03/13 167 lb 15.9 oz (76.2 kg)     Past Medical History  Diagnosis Date  . Open wound of both legs with complication     Pt has had progressive wounds of both LE's including gangrene of the toes and patchy necrosis of the calves.  He has been treated at [facility] by Dr Jerline Pain with hyperbaric O2 5d per week.  He is getting Na Thiosulfate with HD for suspected calciphylaxis. Pt says doctor's aren't sure if these ulcers were diabetic ulcers or calciphylaxis.  He underwent bilat BKA on 07/30/13.  He says that the woun  . ESRD on hemodialysis     Pt has ESRD due to DM.  He had a L upper arm AVF prior to starting HD  which was ligated due to L arm steal syndrome.  He started HD in Jan 2014 and did home HD.  The family couldn't cannulate the R arm AVF successfully so by mid 2014 they decided to switch to PD which was done late summer 2014.  In Jan 2015 he was admitted with FTT felt to be due to underdialysis on PD and PD was abandoned and he   . Anemia     esrd  . Hypertension     off of meds due to orthostatic hypotension  . Closed left arm fracture 1967  . Heart murmur   . Corneal abrasion, left   . GERD (gastroesophageal reflux disease)   . History of blood transfusion   . Glomerulosclerosis, diabetic   . Depression   . A-fib   . Calciphylaxis 05/2013    lower ext  . Diabetes mellitus     Type 1 iddm x 11 yrs  . Diabetic neuropathy   . Cancer     hx duodenal adenoCa 2012, resected    Current Outpatient Prescriptions  Medication Sig Dispense Refill  . aspirin EC 325 MG EC tablet Take 1 tablet (325 mg total) by mouth daily.  30 tablet  0  . atorvastatin (LIPITOR) 10 MG tablet Take 1 tablet (10 mg total) by mouth daily.  30 tablet  1  . cephALEXin (KEFLEX) 250 MG capsule Take 1 capsule (250 mg total) by mouth every 12 (twelve) hours.  20 capsule  0  . Darbepoetin Alfa-Polysorbate (ARANESP, ALBUMIN FREE, IJ) Inject as directed. WITH DIALYSIS      . gabapentin (NEURONTIN) 300 MG capsule Take 300 mg by mouth 2 (two) times daily.      . insulin glargine (LANTUS) 100 UNIT/ML injection Inject 0.1 mLs (10 Units total) into the skin at bedtime.  10 mL  11  . methocarbamol (ROBAXIN) 500 MG tablet Take 1 tablet (500 mg total) by mouth every 6 (six) hours as needed for muscle spasms.  90 tablet  0  . multivitamin (RENA-VIT) TABS tablet Take 1 tablet by mouth at bedtime.  30 tablet  1  . oxyCODONE-acetaminophen (PERCOCET/ROXICET) 5-325 MG per tablet Take 1-2 tablets by mouth every 4 (four) hours as needed for moderate pain.  90 tablet  0  . pantoprazole (PROTONIX) 40 MG tablet Take 40 mg by mouth daily.        . sertraline (ZOLOFT) 50 MG tablet Take 1 tablet (50 mg total) by mouth daily.  30 tablet  1   No current facility-administered medications for this visit.    Allergies:   Metformin and related   Social History:  The patient  reports that he has never smoked. He has never used smokeless tobacco. He reports that he does not drink alcohol or use illicit drugs.   Family History:  The patient's family history includes Cancer in his father and mother; Heart disease in his maternal grandfather; Stroke in his paternal grandmother.   ROS:  Please see the history of present illness.      All other systems reviewed and negative.   PHYSICAL EXAM: VS:  BP 121/61  Pulse 63 Well nourished, well developed, in no acute distress HEENT: normal Neck: no JVDat 90 degrees Cardiac:  normal S1, S2; RRR; no murmur Lungs:  clear to auscultation bilaterally, no wheezing, rhonchi or rales Abd: soft, nontender, no hepatomegaly Ext:  Bilateral BKAs noted Skin: warm and dry Neuro:  CNs 2-12 intact, no focal abnormalities noted  EKG:  NSR, HR 63     ASSESSMENT AND PLAN:  1. Atrial Fibrillation:  Maintaining NSR.  He is no longer on coumadin due to concerns for coumadin skin necrosis.  He did have a bx in the hospital that was positive for calciphylaxis.  He is on ASA.  He is not a candidate for a NOAC due to his ESRD.  CHADS2-VASc=3 (HTN, DM, vascular disease).  He would benefit from long term anticoagulation.  I have recommended he continue ASA for now.  If there is a consensus among his physicians that he did not have coumadin necrosis, we could consider resuming it. 2. ESRD:  He is on dialysis Tu, Th, Sat. 3. PAD s/p Bilateral BKA:  F/u with orthopedics and PM&R as planned.    4. Hyperlipidemia:  Continue statin.  5. Disposition: F/u with Dr. Peter Martinique in 3 mos.   Signed, Richardson Dopp, PA-C  08/20/2013 12:11 PM

## 2013-08-20 NOTE — Patient Instructions (Signed)
Your physician recommends that you continue on your current medications as directed. Please refer to the Current Medication list given to you today  Your physician wants you to follow-up in: 3 MONTHS WITH  DR. Martinique  You will receive a reminder letter in the mail two months in advance. If you don't receive a letter, please call our office to schedule the follow-up appointment.

## 2013-08-23 ENCOUNTER — Ambulatory Visit: Payer: Managed Care, Other (non HMO) | Admitting: Physical Therapy

## 2013-08-25 ENCOUNTER — Ambulatory Visit: Payer: Managed Care, Other (non HMO) | Admitting: Physical Therapy

## 2013-08-27 ENCOUNTER — Encounter (INDEPENDENT_AMBULATORY_CARE_PROVIDER_SITE_OTHER): Payer: Self-pay

## 2013-08-27 ENCOUNTER — Encounter (HOSPITAL_BASED_OUTPATIENT_CLINIC_OR_DEPARTMENT_OTHER): Payer: Managed Care, Other (non HMO) | Attending: General Surgery

## 2013-08-27 DIAGNOSIS — E1159 Type 2 diabetes mellitus with other circulatory complications: Secondary | ICD-10-CM | POA: Insufficient documentation

## 2013-08-27 DIAGNOSIS — Y835 Amputation of limb(s) as the cause of abnormal reaction of the patient, or of later complication, without mention of misadventure at the time of the procedure: Secondary | ICD-10-CM | POA: Insufficient documentation

## 2013-08-27 DIAGNOSIS — L02419 Cutaneous abscess of limb, unspecified: Secondary | ICD-10-CM | POA: Insufficient documentation

## 2013-08-27 DIAGNOSIS — T8789 Other complications of amputation stump: Secondary | ICD-10-CM | POA: Insufficient documentation

## 2013-08-27 DIAGNOSIS — S88119A Complete traumatic amputation at level between knee and ankle, unspecified lower leg, initial encounter: Secondary | ICD-10-CM | POA: Insufficient documentation

## 2013-08-27 DIAGNOSIS — N19 Unspecified kidney failure: Secondary | ICD-10-CM | POA: Insufficient documentation

## 2013-08-27 DIAGNOSIS — Z992 Dependence on renal dialysis: Secondary | ICD-10-CM | POA: Insufficient documentation

## 2013-08-27 DIAGNOSIS — L03119 Cellulitis of unspecified part of limb: Secondary | ICD-10-CM

## 2013-08-27 DIAGNOSIS — I96 Gangrene, not elsewhere classified: Secondary | ICD-10-CM | POA: Insufficient documentation

## 2013-08-28 NOTE — Progress Notes (Signed)
[facility]  NAME:  Paul [name], Paul [name] NO.:  [id number]  MEDICAL RECORD NO.:  [id number]      DATE OF BIRTH:  02/22/1956  PHYSICIAN:  Judene Companion, M.D.      VISIT DATE:  08/27/2013                                  OFFICE VISIT   Paul [name] is a 58 year old gentleman who has had many health problems including type 2 diabetes, and later he developed multiple diabetic ulcers on his legs.  Several years ago, he also developed renal failure and had to be placed on dialysis.  These grafts that we put on his legs helped somewhat, but he continued to have diabetic gangrenous ulcers and he eventually had gangrenous toes bilaterally, gangrenous changes in his heels, and eventually up his lower legs.  He had a thorough vascular workup and there was nothing they could do the bypass him to increase his vascularity.  He eventually underwent bilateral below-knee amputation 3 weeks ago, and he is having problems healing these wounds and especially on the left leg along the suture line, there are several cm of dry gangrenes on the wounds.  I feel that he may have to have some further debridement, but in the meantime, to give him the best chance of healing these wounds and giving adequate healing environment for the flaps of the BK amputations, I would like to have him go through a course of hyperbaric oxygen, and I am asking for permission to do that from his insurance company.     Judene Companion, M.D.     PP/MEDQ  D:  08/27/2013  T:  08/28/2013  Job:  [id number]

## 2013-08-30 ENCOUNTER — Other Ambulatory Visit (HOSPITAL_COMMUNITY): Payer: Self-pay | Admitting: Orthopedic Surgery

## 2013-08-30 LAB — GLUCOSE, CAPILLARY
Glucose-Capillary: 133 mg/dL — ABNORMAL HIGH (ref 70–99)
Glucose-Capillary: 83 mg/dL (ref 70–99)

## 2013-08-31 LAB — GLUCOSE, CAPILLARY
Glucose-Capillary: 211 mg/dL — ABNORMAL HIGH (ref 70–99)
Glucose-Capillary: 141 mg/dL — ABNORMAL HIGH (ref 70–99)

## 2013-09-01 ENCOUNTER — Encounter (HOSPITAL_COMMUNITY): Payer: Self-pay | Admitting: Pharmacy Technician

## 2013-09-01 LAB — GLUCOSE, CAPILLARY
GLUCOSE-CAPILLARY: 107 mg/dL — AB (ref 70–99)
Glucose-Capillary: 155 mg/dL — ABNORMAL HIGH (ref 70–99)

## 2013-09-02 ENCOUNTER — Encounter (HOSPITAL_COMMUNITY): Payer: Self-pay | Admitting: *Deleted

## 2013-09-02 LAB — GLUCOSE, CAPILLARY
GLUCOSE-CAPILLARY: 176 mg/dL — AB (ref 70–99)
Glucose-Capillary: 120 mg/dL — ABNORMAL HIGH (ref 70–99)

## 2013-09-02 MED ORDER — CEFAZOLIN SODIUM-DEXTROSE 2-3 GM-% IV SOLR
2.0000 g | INTRAVENOUS | Status: AC
  Administered 2013-09-03: 2 g via INTRAVENOUS

## 2013-09-03 ENCOUNTER — Encounter (HOSPITAL_COMMUNITY)
Admission: RE | Disposition: A | Discharge status: Home / Self Care | Payer: Self-pay | Source: Ambulatory Visit | Attending: Orthopedic Surgery

## 2013-09-03 ENCOUNTER — Encounter (HOSPITAL_COMMUNITY): Payer: Self-pay | Admitting: *Deleted

## 2013-09-03 ENCOUNTER — Ambulatory Visit (HOSPITAL_COMMUNITY)
Admission: RE | Admit: 2013-09-03 | Discharge: 2013-09-04 | Disposition: A | Discharge status: Home / Self Care | Payer: Managed Care, Other (non HMO) | Source: Ambulatory Visit | Attending: Orthopedic Surgery | Admitting: Orthopedic Surgery

## 2013-09-03 ENCOUNTER — Encounter (HOSPITAL_COMMUNITY): Payer: Managed Care, Other (non HMO) | Admitting: Certified Registered"

## 2013-09-03 ENCOUNTER — Ambulatory Visit (HOSPITAL_COMMUNITY): Payer: Managed Care, Other (non HMO) | Admitting: Certified Registered"

## 2013-09-03 DIAGNOSIS — E1059 Type 1 diabetes mellitus with other circulatory complications: Secondary | ICD-10-CM | POA: Insufficient documentation

## 2013-09-03 DIAGNOSIS — Z992 Dependence on renal dialysis: Secondary | ICD-10-CM | POA: Insufficient documentation

## 2013-09-03 DIAGNOSIS — T8789 Other complications of amputation stump: Secondary | ICD-10-CM | POA: Insufficient documentation

## 2013-09-03 DIAGNOSIS — E1029 Type 1 diabetes mellitus with other diabetic kidney complication: Secondary | ICD-10-CM | POA: Insufficient documentation

## 2013-09-03 DIAGNOSIS — E1049 Type 1 diabetes mellitus with other diabetic neurological complication: Secondary | ICD-10-CM | POA: Insufficient documentation

## 2013-09-03 DIAGNOSIS — S88119A Complete traumatic amputation at level between knee and ankle, unspecified lower leg, initial encounter: Secondary | ICD-10-CM | POA: Insufficient documentation

## 2013-09-03 DIAGNOSIS — K219 Gastro-esophageal reflux disease without esophagitis: Secondary | ICD-10-CM | POA: Insufficient documentation

## 2013-09-03 DIAGNOSIS — I120 Hypertensive chronic kidney disease with stage 5 chronic kidney disease or end stage renal disease: Secondary | ICD-10-CM | POA: Insufficient documentation

## 2013-09-03 DIAGNOSIS — F3289 Other specified depressive episodes: Secondary | ICD-10-CM | POA: Insufficient documentation

## 2013-09-03 DIAGNOSIS — F329 Major depressive disorder, single episode, unspecified: Secondary | ICD-10-CM | POA: Insufficient documentation

## 2013-09-03 DIAGNOSIS — N186 End stage renal disease: Secondary | ICD-10-CM | POA: Insufficient documentation

## 2013-09-03 DIAGNOSIS — I96 Gangrene, not elsewhere classified: Secondary | ICD-10-CM | POA: Insufficient documentation

## 2013-09-03 DIAGNOSIS — D631 Anemia in chronic kidney disease: Secondary | ICD-10-CM | POA: Insufficient documentation

## 2013-09-03 DIAGNOSIS — T879 Unspecified complications of amputation stump: Secondary | ICD-10-CM | POA: Diagnosis present

## 2013-09-03 DIAGNOSIS — Z8509 Personal history of malignant neoplasm of other digestive organs: Secondary | ICD-10-CM | POA: Insufficient documentation

## 2013-09-03 DIAGNOSIS — N039 Chronic nephritic syndrome with unspecified morphologic changes: Secondary | ICD-10-CM

## 2013-09-03 DIAGNOSIS — N049 Nephrotic syndrome with unspecified morphologic changes: Secondary | ICD-10-CM | POA: Insufficient documentation

## 2013-09-03 DIAGNOSIS — Z7901 Long term (current) use of anticoagulants: Secondary | ICD-10-CM | POA: Insufficient documentation

## 2013-09-03 DIAGNOSIS — E1142 Type 2 diabetes mellitus with diabetic polyneuropathy: Secondary | ICD-10-CM | POA: Insufficient documentation

## 2013-09-03 DIAGNOSIS — Y835 Amputation of limb(s) as the cause of abnormal reaction of the patient, or of later complication, without mention of misadventure at the time of the procedure: Secondary | ICD-10-CM | POA: Insufficient documentation

## 2013-09-03 DIAGNOSIS — R011 Cardiac murmur, unspecified: Secondary | ICD-10-CM | POA: Insufficient documentation

## 2013-09-03 HISTORY — PX: AMPUTATION: SHX166

## 2013-09-03 LAB — POCT I-STAT 4, (NA,K, GLUC, HGB,HCT)
Glucose, Bld: 154 mg/dL — ABNORMAL HIGH (ref 70–99)
HCT: 33 % — ABNORMAL LOW (ref 39.0–52.0)
Hemoglobin: 11.2 g/dL — ABNORMAL LOW (ref 13.0–17.0)
Potassium: 4.7 mEq/L (ref 3.7–5.3)
SODIUM: 139 meq/L (ref 137–147)

## 2013-09-03 LAB — GLUCOSE, CAPILLARY
GLUCOSE-CAPILLARY: 119 mg/dL — AB (ref 70–99)
GLUCOSE-CAPILLARY: 113 mg/dL — AB (ref 70–99)
Glucose-Capillary: 136 mg/dL — ABNORMAL HIGH (ref 70–99)
Glucose-Capillary: 114 mg/dL — ABNORMAL HIGH (ref 70–99)

## 2013-09-03 SURGERY — AMPUTATION BELOW KNEE
Anesthesia: General | Site: Leg Lower | Laterality: Bilateral

## 2013-09-03 MED ORDER — FENTANYL CITRATE 0.05 MG/ML IJ SOLN
INTRAMUSCULAR | Status: AC
  Filled 2013-09-03: qty 5

## 2013-09-03 MED ORDER — PROPOFOL 10 MG/ML IV BOLUS
INTRAVENOUS | Status: AC
  Filled 2013-09-03: qty 20

## 2013-09-03 MED ORDER — METOCLOPRAMIDE HCL 10 MG PO TABS: 5.0000 mg | ORAL_TABLET | Freq: Three times a day (TID) | ORAL | Status: DC | PRN

## 2013-09-03 MED ORDER — LIDOCAINE HCL (CARDIAC) 20 MG/ML IV SOLN
INTRAVENOUS | Status: DC | PRN
  Administered 2013-09-03: 100 mg via INTRAVENOUS

## 2013-09-03 MED ORDER — HYDROMORPHONE HCL PF 1 MG/ML IJ SOLN
INTRAMUSCULAR | Status: AC
  Administered 2013-09-03: 0.5 mg via INTRAVENOUS
  Filled 2013-09-03: qty 1

## 2013-09-03 MED ORDER — METHOCARBAMOL 500 MG PO TABS
500.0000 mg | ORAL_TABLET | Freq: Four times a day (QID) | ORAL | Status: DC | PRN
  Administered 2013-09-04: 500 mg via ORAL
  Filled 2013-09-03: qty 1

## 2013-09-03 MED ORDER — SODIUM CHLORIDE 0.9 % IV SOLN: INTRAVENOUS | Status: DC

## 2013-09-03 MED ORDER — SERTRALINE HCL 50 MG PO TABS
50.0000 mg | ORAL_TABLET | Freq: Every day | ORAL | Status: DC
  Administered 2013-09-04: 50 mg via ORAL
  Filled 2013-09-03: qty 1

## 2013-09-03 MED ORDER — INSULIN GLARGINE 100 UNIT/ML ~~LOC~~ SOLN
10.0000 [IU] | Freq: Every day | SUBCUTANEOUS | Status: DC
  Administered 2013-09-03: 10 [IU] via SUBCUTANEOUS
  Filled 2013-09-03 (×2): qty 0.1

## 2013-09-03 MED ORDER — SODIUM CHLORIDE 0.9 % IV SOLN
INTRAVENOUS | Status: DC
  Administered 2013-09-03: 12:00:00 via INTRAVENOUS

## 2013-09-03 MED ORDER — PROPOFOL 10 MG/ML IV BOLUS
INTRAVENOUS | Status: DC | PRN
  Administered 2013-09-03: 200 mg via INTRAVENOUS

## 2013-09-03 MED ORDER — INSULIN ASPART 100 UNIT/ML ~~LOC~~ SOLN: 3.0000 [IU] | Freq: Three times a day (TID) | SUBCUTANEOUS | Status: DC

## 2013-09-03 MED ORDER — METOCLOPRAMIDE HCL 5 MG/ML IJ SOLN: 5.0000 mg | Freq: Three times a day (TID) | INTRAMUSCULAR | Status: DC | PRN

## 2013-09-03 MED ORDER — FENTANYL CITRATE 0.05 MG/ML IJ SOLN
INTRAMUSCULAR | Status: DC | PRN
  Administered 2013-09-03: 50 ug via INTRAVENOUS

## 2013-09-03 MED ORDER — HYDROMORPHONE HCL PF 1 MG/ML IJ SOLN
0.2500 mg | INTRAMUSCULAR | Status: DC | PRN
  Administered 2013-09-03 (×2): 0.5 mg via INTRAVENOUS

## 2013-09-03 MED ORDER — ASPIRIN EC 325 MG PO TBEC
325.0000 mg | DELAYED_RELEASE_TABLET | Freq: Every day | ORAL | Status: DC
  Administered 2013-09-03 – 2013-09-04 (×2): 325 mg via ORAL
  Filled 2013-09-03 (×2): qty 1

## 2013-09-03 MED ORDER — GLYCOPYRROLATE 0.2 MG/ML IJ SOLN
INTRAMUSCULAR | Status: DC | PRN
  Administered 2013-09-03: 0.4 mg via INTRAVENOUS

## 2013-09-03 MED ORDER — OXYCODONE-ACETAMINOPHEN 5-325 MG PO TABS
1.0000 | ORAL_TABLET | ORAL | Status: DC | PRN
  Administered 2013-09-03 – 2013-09-04 (×3): 2 via ORAL
  Filled 2013-09-03 (×3): qty 2

## 2013-09-03 MED ORDER — PHENYLEPHRINE HCL 10 MG/ML IJ SOLN
INTRAMUSCULAR | Status: DC | PRN
  Administered 2013-09-03: 80 ug via INTRAVENOUS

## 2013-09-03 MED ORDER — ONDANSETRON HCL 4 MG/2ML IJ SOLN: 4.0000 mg | Freq: Once | INTRAMUSCULAR | Status: DC | PRN

## 2013-09-03 MED ORDER — ONDANSETRON HCL 4 MG PO TABS
4.0000 mg | ORAL_TABLET | Freq: Four times a day (QID) | ORAL | Status: DC | PRN
Start: 2013-09-03 — End: 2013-09-04

## 2013-09-03 MED ORDER — ONDANSETRON HCL 4 MG/2ML IJ SOLN: 4.0000 mg | Freq: Four times a day (QID) | INTRAMUSCULAR | Status: DC | PRN

## 2013-09-03 MED ORDER — SEVELAMER CARBONATE 800 MG PO TABS
800.0000 mg | ORAL_TABLET | Freq: Three times a day (TID) | ORAL | Status: DC
  Administered 2013-09-03 – 2013-09-04 (×2): 800 mg via ORAL
  Filled 2013-09-03 (×5): qty 1

## 2013-09-03 MED ORDER — ATORVASTATIN CALCIUM 10 MG PO TABS
10.0000 mg | ORAL_TABLET | Freq: Every day | ORAL | Status: DC
Start: 2013-09-03 — End: 2013-09-04
  Administered 2013-09-03 – 2013-09-04 (×2): 10 mg via ORAL
  Filled 2013-09-03 (×2): qty 1

## 2013-09-03 MED ORDER — HYDROMORPHONE HCL PF 1 MG/ML IJ SOLN: 0.5000 mg | INTRAMUSCULAR | Status: DC | PRN

## 2013-09-03 MED ORDER — DOXYCYCLINE HYCLATE 100 MG PO TABS
100.0000 mg | ORAL_TABLET | Freq: Two times a day (BID) | ORAL | Status: DC
  Administered 2013-09-03 – 2013-09-04 (×2): 100 mg via ORAL
  Filled 2013-09-03 (×3): qty 1

## 2013-09-03 MED ORDER — PANTOPRAZOLE SODIUM 40 MG PO TBEC
40.0000 mg | DELAYED_RELEASE_TABLET | Freq: Every day | ORAL | Status: DC
  Administered 2013-09-04: 40 mg via ORAL
  Filled 2013-09-03: qty 1

## 2013-09-03 MED ORDER — INSULIN ASPART 100 UNIT/ML ~~LOC~~ SOLN: 0.0000 [IU] | Freq: Three times a day (TID) | SUBCUTANEOUS | Status: DC

## 2013-09-03 MED ORDER — RENA-VITE PO TABS
1.0000 | ORAL_TABLET | Freq: Every day | ORAL | Status: DC
  Administered 2013-09-03: 1 via ORAL
  Filled 2013-09-03 (×3): qty 1

## 2013-09-03 MED ORDER — 0.9 % SODIUM CHLORIDE (POUR BTL) OPTIME
TOPICAL | Status: DC | PRN
  Administered 2013-09-03: 1000 mL

## 2013-09-03 SURGICAL SUPPLY — 55 items
BANDAGE ESMARK 6X9 LF (GAUZE/BANDAGES/DRESSINGS) ×1 IMPLANT
BANDAGE GAUZE ELAST BULKY 4 IN (GAUZE/BANDAGES/DRESSINGS) ×6 IMPLANT
BLADE SAW RECIP 87.9 MT (BLADE) ×3 IMPLANT
BLADE SURG 21 STRL SS (BLADE) ×7 IMPLANT
BNDG CMPR 9X6 STRL LF SNTH (GAUZE/BANDAGES/DRESSINGS)
BNDG COHESIVE 6X5 TAN STRL LF (GAUZE/BANDAGES/DRESSINGS) ×6 IMPLANT
BNDG ESMARK 6X9 LF (GAUZE/BANDAGES/DRESSINGS)
CANISTER SUCTION WELLS/JOHNSON (MISCELLANEOUS) ×2 IMPLANT
COVER SURGICAL LIGHT HANDLE (MISCELLANEOUS) ×3 IMPLANT
CUFF TOURNIQUET SINGLE 34IN LL (TOURNIQUET CUFF) IMPLANT
CUFF TOURNIQUET SINGLE 44IN (TOURNIQUET CUFF) IMPLANT
DRAIN PENROSE 1/2X12 LTX STRL (WOUND CARE) IMPLANT
DRAPE EXTREMITY BILATERAL (DRAPE) ×2 IMPLANT
DRAPE EXTREMITY T 121X128X90 (DRAPE) ×1 IMPLANT
DRAPE PROXIMA HALF (DRAPES) ×6 IMPLANT
DRAPE U-SHAPE 47X51 STRL (DRAPES) ×6 IMPLANT
DRSG ADAPTIC 3X8 NADH LF (GAUZE/BANDAGES/DRESSINGS) ×5 IMPLANT
DRSG PAD ABDOMINAL 8X10 ST (GAUZE/BANDAGES/DRESSINGS) ×13 IMPLANT
DURAPREP 26ML APPLICATOR (WOUND CARE) ×5 IMPLANT
ELECT REM PT RETURN 9FT ADLT (ELECTROSURGICAL) ×3
ELECTRODE REM PT RTRN 9FT ADLT (ELECTROSURGICAL) ×1 IMPLANT
FACESHIELD WRAPAROUND (MASK) ×3 IMPLANT
FACESHIELD WRAPAROUND OR TEAM (MASK) IMPLANT
GLOVE BIOGEL PI IND STRL 6.5 (GLOVE) IMPLANT
GLOVE BIOGEL PI IND STRL 8 (GLOVE) IMPLANT
GLOVE BIOGEL PI IND STRL 9 (GLOVE) ×1 IMPLANT
GLOVE BIOGEL PI INDICATOR 6.5 (GLOVE) ×2
GLOVE BIOGEL PI INDICATOR 8 (GLOVE) ×2
GLOVE BIOGEL PI INDICATOR 9 (GLOVE) ×2
GLOVE SURG ORTHO 9.0 STRL STRW (GLOVE) ×3 IMPLANT
GLOVE SURG SS PI 6.5 STRL IVOR (GLOVE) ×3 IMPLANT
GLOVE SURG SS PI 8.0 STRL IVOR (GLOVE) ×2 IMPLANT
GOWN SPEC L3 XXLG W/TWL (GOWN DISPOSABLE) ×2 IMPLANT
GOWN STRL REUS W/ TWL XL LVL3 (GOWN DISPOSABLE) ×2 IMPLANT
GOWN STRL REUS W/TWL MED LVL3 (GOWN DISPOSABLE) ×2 IMPLANT
GOWN STRL REUS W/TWL XL LVL3 (GOWN DISPOSABLE) ×3
KIT BASIN OR (CUSTOM PROCEDURE TRAY) ×3 IMPLANT
KIT ROOM TURNOVER OR (KITS) ×3 IMPLANT
MANIFOLD NEPTUNE II (INSTRUMENTS) ×1 IMPLANT
NS IRRIG 1000ML POUR BTL (IV SOLUTION) ×3 IMPLANT
PACK GENERAL/GYN (CUSTOM PROCEDURE TRAY) ×3 IMPLANT
PAD ARMBOARD 7.5X6 YLW CONV (MISCELLANEOUS) ×4 IMPLANT
SPONGE GAUZE 4X4 12PLY (GAUZE/BANDAGES/DRESSINGS) ×3 IMPLANT
SPONGE GAUZE 4X4 12PLY STER LF (GAUZE/BANDAGES/DRESSINGS) ×2 IMPLANT
SPONGE LAP 18X18 X RAY DECT (DISPOSABLE) ×3 IMPLANT
STAPLER VISISTAT 35W (STAPLE) ×2 IMPLANT
STOCKINETTE IMPERVIOUS LG (DRAPES) ×1 IMPLANT
SUT ETHILON 2 0 PSLX (SUTURE) ×4 IMPLANT
SUT PDS AB 1 CT  36 (SUTURE)
SUT PDS AB 1 CT 36 (SUTURE) IMPLANT
SUT SILK 2 0 (SUTURE) ×3
SUT SILK 2-0 18XBRD TIE 12 (SUTURE) ×1 IMPLANT
TOWEL OR 17X24 6PK STRL BLUE (TOWEL DISPOSABLE) ×3 IMPLANT
TOWEL OR 17X26 10 PK STRL BLUE (TOWEL DISPOSABLE) ×3 IMPLANT
TUBE ANAEROBIC SPECIMEN COL (MISCELLANEOUS) IMPLANT

## 2013-09-03 NOTE — H&P (Signed)
Paul [name] is an 58 y.o. male.   Chief Complaint: Gangrenous dehiscence of bilateral transtibial amputations HPI: Patient is a 58 year old gentleman with severe peripheral disease diabetes end-stage renal disease on dialysis who presents with large gangrenous necrotic ulcers bilateral transtibial amputations.  Past Medical History  Diagnosis Date  . Open wound of both legs with complication     Pt has had progressive wounds of both LE's including gangrene of the toes and patchy necrosis of the calves.  He has been treated at [facility] by Dr Jerline Pain with hyperbaric O2 5d per week.  He is getting Na Thiosulfate with HD for suspected calciphylaxis. Pt says doctor's aren't sure if these ulcers were diabetic ulcers or calciphylaxis.  He underwent bilat BKA on 07/30/13.  He says that the woun  . ESRD on hemodialysis     Pt has ESRD due to DM.  He had a L upper arm AVF prior to starting HD which was ligated due to L arm steal syndrome.  He started HD in Jan 2014 and did home HD.  The family couldn't cannulate the R arm AVF successfully so by mid 2014 they decided to switch to PD which was done late summer 2014.  In Jan 2015 he was admitted with FTT felt to be due to underdialysis on PD and PD was abandoned and he   . Anemia     esrd  . Hypertension     off of meds due to orthostatic hypotension  . Closed left arm fracture 1967  . Heart murmur   . Corneal abrasion, left   . GERD (gastroesophageal reflux disease)   . History of blood transfusion   . Glomerulosclerosis, diabetic   . Depression   . A-fib   . Calciphylaxis 05/2013    lower ext  . Diabetes mellitus     Type 1 iddm x 11 yrs  . Diabetic neuropathy   . Cancer     hx duodenal adenoCa 2002, resected    Past Surgical History  Procedure Laterality Date  . Whipple procedure  2002    duodenal ca, Dr. Harlow Asa  . Bone marrow biopsy  2012  . Renal biopsy, percutaneous  2012  . Tonsillectomy    . Portacath placement  2002  . Biliary  diversion, external  2002  . Other surgical history      Cyst removed from back  . Av fistula placement  06/07/2011    Procedure: ARTERIOVENOUS (AV) FISTULA CREATION;  Surgeon: Angelia Mould, MD;  Location: [facility];  Service: Vascular;  Laterality: Left;  . Ligation of arteriovenous  fistula  05/22/2012    Procedure: LIGATION OF ARTERIOVENOUS  FISTULA;  Surgeon: Mal Misty, MD;  Location: [facility];  Service: Vascular;  Laterality: Left;  Left brachio-cephalic fistula  . Insertion of dialysis catheter  05/22/2012    Procedure: INSERTION OF DIALYSIS CATHETER;  Surgeon: Mal Misty, MD;  Location: [place] [facility];  Service: Vascular;  Laterality: N/A;  right internal jugular vein  . Av fistula placement  06/18/2012    Procedure: ARTERIOVENOUS (AV) FISTULA CREATION;  Surgeon: Mal Misty, MD;  Location: [facility];  Service: Vascular;  Laterality: Right;  [place]  . Colonoscopy      Hx: of  . Capd insertion N/A 01/14/2013    Procedure: LAPAROSCOPIC INSERTION CONTINUOUS AMBULATORY PERITONEAL DIALYSIS  (CAPD) CATHETER;  Surgeon: Adin Hector, MD;  Location: [facility];  Service: General;  Laterality: N/A;  . Vascular surgery    .  Removal of a dialysis catheter    . Capd removal N/A 07/09/2013    Procedure: CONTINUOUS AMBULATORY PERITONEAL DIALYSIS  (CAPD) CATHETER REMOVAL;  Surgeon: Adin Hector, MD;  Location: [facility];  Service: General;  Laterality: N/A;  . Amputation Bilateral 07/30/2013    Procedure: AMPUTATION BELOW KNEE;  Surgeon: Newt Minion, MD;  Location: [place] [facility];  Service: Orthopedics;  Laterality: Bilateral;  Bilateral Below Knee Amputations    Family History  Problem Relation Age of Onset  . Cancer Mother   . Cancer Father     prostate cancer  . Heart disease Maternal Grandfather   . Stroke Paternal Grandmother    Social History:  reports that he has never smoked. He has never used smokeless tobacco. He reports that he does not drink alcohol or use illicit drugs.  Allergies:  Allergies   Allergen Reactions  . Metformin And Related Other (See Comments)    Kidney problems    No prescriptions prior to admission    Results for orders placed in visit on 08/27/13 (from the past 48 hour(s))  GLUCOSE, CAPILLARY     Status: Abnormal   Collection Time    09/01/13 10:13 AM      Result Value Ref Range   Glucose-Capillary 155 (*) 70 - 99 mg/dL  GLUCOSE, CAPILLARY     Status: Abnormal   Collection Time    09/01/13 12:15 PM      Result Value Ref Range   Glucose-Capillary 107 (*) 70 - 99 mg/dL  GLUCOSE, CAPILLARY     Status: Abnormal   Collection Time    09/02/13  9:53 AM      Result Value Ref Range   Glucose-Capillary 176 (*) 70 - 99 mg/dL  GLUCOSE, CAPILLARY     Status: Abnormal   Collection Time    09/02/13 12:10 PM      Result Value Ref Range   Glucose-Capillary 120 (*) 70 - 99 mg/dL   No results found.  Review of Systems  All other systems reviewed and are negative.   There were no vitals taken for this visit. Physical Exam  Large gangrenous ulcers bilateral transtibial amputations.  Assessment/Plan Assessment: Large gangrenous ulcers bilateral transtibial amputation.  Plan: Will plan for revision of the bilateral transtibial amputations. Risk and benefits were discussed including nonhealing of the wounds. Patient states he understands was pursued this time.  Newt Minion 09/03/2013, 6:17 AM

## 2013-09-03 NOTE — Anesthesia Preprocedure Evaluation (Addendum)
Anesthesia Evaluation  Patient identified by MRN, date of birth, ID band Patient awake    Reviewed: Allergy & Precautions, H&P , NPO status , Patient's Chart, lab work & pertinent test results  History of Anesthesia Complications Negative for: history of anesthetic complications  Airway Mallampati: I TM Distance: >3 FB     Dental  (+) Teeth Intact   Pulmonary          Cardiovascular hypertension, Pt. on medications + Peripheral Vascular Disease     Neuro/Psych PSYCHIATRIC DISORDERS Depression  Neuromuscular disease    GI/Hepatic GERD-  Medicated and Controlled,  Endo/Other  diabetes, Type 2, Insulin Dependent  Renal/GU ESRF and DialysisRenal diseaseHD T/Th/Sat K4.7     Musculoskeletal   Abdominal   Peds  Hematology  (+) anemia , hgb 11, hct 33   Anesthesia Other Findings   Reproductive/Obstetrics                         Anesthesia Physical Anesthesia Plan  ASA: III  Anesthesia Plan: General   Post-op Pain Management:    Induction: Intravenous  Airway Management Planned: LMA  Additional Equipment:   Intra-op Plan:   Post-operative Plan: Extubation in OR  Informed Consent: I have reviewed the patients History and Physical, chart, labs and discussed the procedure including the risks, benefits and alternatives for the proposed anesthesia with the patient or authorized representative who has indicated his/her understanding and acceptance.   Dental advisory given  Plan Discussed with: Anesthesiologist and Surgeon  Anesthesia Plan Comments:         Anesthesia Quick Evaluation

## 2013-09-03 NOTE — Transfer of Care (Signed)
Immediate Anesthesia Transfer of Care Note  Patient: Paul [name]  Procedure(s) Performed: Procedure(s) with comments: AMPUTATION BELOW KNEE (Bilateral) - Bilateral Below Knee Amputation Revision  Patient Location: PACU  Anesthesia Type:General  Level of Consciousness: awake, alert  and oriented  Airway & Oxygen Therapy: Patient Spontanous Breathing and Patient connected to nasal cannula oxygen  Post-op Assessment: Report given to PACU RN and Post -op Vital signs reviewed and stable  Post vital signs: Reviewed and stable  Complications: No apparent anesthesia complications

## 2013-09-03 NOTE — Op Note (Signed)
OPERATIVE REPORT  DATE OF SURGERY: 09/03/2013  PATIENT:  Paul [name],  58 y.o. male  PRE-OPERATIVE DIAGNOSIS:  Bilateral Below Knee Amputation Gangrene  POST-OPERATIVE DIAGNOSIS:  Bilateral Below Knee Amputation Gangrene  PROCEDURE:  Procedure(s): AMPUTATION BELOW KNEE revision bilateral transtibial amputations.  SURGEON:  Surgeon(s): Newt Minion, MD  ANESTHESIA:   general  EBL:  Minimal ML  SPECIMEN:  No Specimen  TOURNIQUET:  * No tourniquets in log *  PROCEDURE DETAILS: Patient is a 58 year old gentleman with severe peripheral vascular disease. He is status post bilateral transtibial amputations which have progressed to dehiscence and a large area necrotic gangrenous tissue. Do to failure of the wound healing patient presents at this time for revision transtibial amputation. Discussed the risk and benefits including the need for an above-the-knee amputation. Patient states she understands and wished to proceed at this time. Description of procedure patient brought to the operating room and underwent a general anesthetic. After adequate levels and anesthesia obtained patient's both lower extremities were prepped using DuraPrep draped into a sterile field. A timeout was called. A fishmouth incision was made around the left transtibial amputation this was carried down to bone the tibia and fibula were resected approximately 3 cm. The tissue and the bone was resected in one block of tissue. Hemostasis was obtained and vascular bundles were suture ligated with 2-0 silk. There was some mild ischemic skin and soft tissue changes but no necrotic tissue. The incision was closed in 2-0 nylon staples. The wound is covered Adaptic orthopedic sponges AB dressing Kerlix and Coban. Attention was then focused to the right lower extremity. A fishmouth incision was made around the necrotic tissue the right lower extremity this was carried down to bone the tibia and fibular were resected in one block of  tissue with resection approximately 3 cm of bone. 2-0 nylon was used for hemostasis the skin was closed using 2-0 nylon and staples. There was also some mild ischemic changes but there was good bleeding. The wound was covered Adaptic orthopedic sponges AB dressing Kerlix and Coban. Patient was extubated taken to the PACU in stable condition.  PLAN OF CARE: Admit to inpatient   PATIENT DISPOSITION:  PACU - hemodynamically stable.   Newt Minion, MD 09/03/2013 5:34 PM

## 2013-09-03 NOTE — Progress Notes (Signed)
Pt seen. Plan is for him to be DC'd in AM and go to outpt HD at [facility] at his usual time.  Dr Jess Barters office aware.

## 2013-09-03 NOTE — Anesthesia Procedure Notes (Signed)
Procedure Name: LMA Insertion Date/Time: 09/03/2013 1:42 PM Performed by: Manuela Schwartz B Patient Re-evaluated:Patient Re-evaluated prior to inductionOxygen Delivery Method: Circle system utilized Preoxygenation: Pre-oxygenation with 100% oxygen Intubation Type: IV induction LMA: LMA inserted LMA Size: 4.0 Number of attempts: 1 Placement Confirmation: positive ETCO2 and breath sounds checked- equal and bilateral Tube secured with: Tape Dental Injury: Teeth and Oropharynx as per pre-operative assessment

## 2013-09-03 NOTE — Anesthesia Postprocedure Evaluation (Signed)
  Anesthesia Post-op Note  Patient: Paul [name]  Procedure(s) Performed: Procedure(s) with comments: AMPUTATION BELOW KNEE (Bilateral) - Bilateral Below Knee Amputation Revision  Patient Location: PACU  Anesthesia Type:General  Level of Consciousness: awake, alert , oriented and patient cooperative  Airway and Oxygen Therapy: Patient Spontanous Breathing  Post-op Pain: mild  Post-op Assessment: Post-op Vital signs reviewed, Patient's Cardiovascular Status Stable, Respiratory Function Stable, Patent Airway and No signs of Nausea or vomiting  Post-op Vital Signs: stable  Last Vitals:  Filed Vitals:   09/03/13 1500  BP: 143/75  Pulse: 74  Temp:   Resp: 12    Complications: No apparent anesthesia complications

## 2013-09-03 NOTE — Progress Notes (Signed)
Paul [name] is a 58 yo male ESRD patient, known to our service, who dialyzes at the [facility] on a TTS schedule.  He is status post revision of his bilateral transtibial amputations. The patient was seen, pain is well controlled, K+ is within normal limits, and his medications have been reviewed. Discharge is planned for tomorrow morning. The patient is hemodynamically stable and wishes to dialyze as an outpatient per his regular schedule. I have discussed this with the nephrology attending who is amenable to this plan. Will advise RN of his 12:30 hemodialysis appointment, so that a timely discharge may be facilitated.  Sonnie Alamo MHS, PA-C [employer] Pager [phone]

## 2013-09-04 LAB — GLUCOSE, CAPILLARY
GLUCOSE-CAPILLARY: 147 mg/dL — AB (ref 70–99)
GLUCOSE-CAPILLARY: 148 mg/dL — AB (ref 70–99)
Glucose-Capillary: 128 mg/dL — ABNORMAL HIGH (ref 70–99)

## 2013-09-04 NOTE — Discharge Instructions (Signed)
Keep dressings clean dry and intact.

## 2013-09-04 NOTE — Discharge Summary (Signed)
  Discharge to home in stable condition. Patient to have dialysis today at his home dialysis center. Final diagnosis gangrene bilateral transtibial amputations. Surgical procedure revision bilateral transtibial amputations. Followup in the office in one week. No prescriptions for discharge.

## 2013-09-06 ENCOUNTER — Encounter (HOSPITAL_COMMUNITY): Payer: Self-pay | Admitting: Orthopedic Surgery

## 2013-09-07 LAB — GLUCOSE, CAPILLARY
GLUCOSE-CAPILLARY: 123 mg/dL — AB (ref 70–99)
GLUCOSE-CAPILLARY: 153 mg/dL — AB (ref 70–99)
Glucose-Capillary: 116 mg/dL — ABNORMAL HIGH (ref 70–99)

## 2013-09-08 LAB — GLUCOSE, CAPILLARY
Glucose-Capillary: 200 mg/dL — ABNORMAL HIGH (ref 70–99)
Glucose-Capillary: 193 mg/dL — ABNORMAL HIGH (ref 70–99)
Glucose-Capillary: 162 mg/dL — ABNORMAL HIGH (ref 70–99)
Glucose-Capillary: 146 mg/dL — ABNORMAL HIGH (ref 70–99)

## 2013-09-10 LAB — GLUCOSE, CAPILLARY
Glucose-Capillary: 131 mg/dL — ABNORMAL HIGH (ref 70–99)
Glucose-Capillary: 156 mg/dL — ABNORMAL HIGH (ref 70–99)

## 2013-09-13 LAB — GLUCOSE, CAPILLARY
GLUCOSE-CAPILLARY: 220 mg/dL — AB (ref 70–99)
GLUCOSE-CAPILLARY: 155 mg/dL — AB (ref 70–99)

## 2013-09-14 LAB — GLUCOSE, CAPILLARY
GLUCOSE-CAPILLARY: 175 mg/dL — AB (ref 70–99)
Glucose-Capillary: 159 mg/dL — ABNORMAL HIGH (ref 70–99)
Glucose-Capillary: 121 mg/dL — ABNORMAL HIGH (ref 70–99)
Glucose-Capillary: 161 mg/dL — ABNORMAL HIGH (ref 70–99)

## 2013-09-17 ENCOUNTER — Encounter (HOSPITAL_BASED_OUTPATIENT_CLINIC_OR_DEPARTMENT_OTHER): Payer: Managed Care, Other (non HMO) | Attending: General Surgery

## 2013-09-17 DIAGNOSIS — E1169 Type 2 diabetes mellitus with other specified complication: Secondary | ICD-10-CM | POA: Insufficient documentation

## 2013-09-17 DIAGNOSIS — T85698A Other mechanical complication of other specified internal prosthetic devices, implants and grafts, initial encounter: Secondary | ICD-10-CM | POA: Insufficient documentation

## 2013-09-17 DIAGNOSIS — L97809 Non-pressure chronic ulcer of other part of unspecified lower leg with unspecified severity: Secondary | ICD-10-CM | POA: Insufficient documentation

## 2013-09-17 DIAGNOSIS — Y831 Surgical operation with implant of artificial internal device as the cause of abnormal reaction of the patient, or of later complication, without mention of misadventure at the time of the procedure: Secondary | ICD-10-CM | POA: Insufficient documentation

## 2013-09-17 DIAGNOSIS — Z992 Dependence on renal dialysis: Secondary | ICD-10-CM | POA: Insufficient documentation

## 2013-09-17 DIAGNOSIS — S88119A Complete traumatic amputation at level between knee and ankle, unspecified lower leg, initial encounter: Secondary | ICD-10-CM | POA: Insufficient documentation

## 2013-09-17 DIAGNOSIS — I739 Peripheral vascular disease, unspecified: Secondary | ICD-10-CM | POA: Insufficient documentation

## 2013-09-17 LAB — GLUCOSE, CAPILLARY
Glucose-Capillary: 149 mg/dL — ABNORMAL HIGH (ref 70–99)
Glucose-Capillary: 156 mg/dL — ABNORMAL HIGH (ref 70–99)

## 2013-09-21 LAB — GLUCOSE, CAPILLARY
GLUCOSE-CAPILLARY: 124 mg/dL — AB (ref 70–99)
Glucose-Capillary: 115 mg/dL — ABNORMAL HIGH (ref 70–99)

## 2013-09-22 LAB — GLUCOSE, CAPILLARY
GLUCOSE-CAPILLARY: 139 mg/dL — AB (ref 70–99)
Glucose-Capillary: 160 mg/dL — ABNORMAL HIGH (ref 70–99)

## 2013-09-23 LAB — GLUCOSE, CAPILLARY
GLUCOSE-CAPILLARY: 160 mg/dL — AB (ref 70–99)
Glucose-Capillary: 125 mg/dL — ABNORMAL HIGH (ref 70–99)

## 2013-09-24 LAB — GLUCOSE, CAPILLARY
Glucose-Capillary: 138 mg/dL — ABNORMAL HIGH (ref 70–99)
Glucose-Capillary: 153 mg/dL — ABNORMAL HIGH (ref 70–99)
Glucose-Capillary: 219 mg/dL — ABNORMAL HIGH (ref 70–99)
Glucose-Capillary: 148 mg/dL — ABNORMAL HIGH (ref 70–99)

## 2013-09-27 LAB — GLUCOSE, CAPILLARY
GLUCOSE-CAPILLARY: 165 mg/dL — AB (ref 70–99)
GLUCOSE-CAPILLARY: 176 mg/dL — AB (ref 70–99)

## 2013-09-28 LAB — GLUCOSE, CAPILLARY
GLUCOSE-CAPILLARY: 110 mg/dL — AB (ref 70–99)
Glucose-Capillary: 159 mg/dL — ABNORMAL HIGH (ref 70–99)

## 2013-09-29 LAB — GLUCOSE, CAPILLARY
GLUCOSE-CAPILLARY: 165 mg/dL — AB (ref 70–99)
Glucose-Capillary: 117 mg/dL — ABNORMAL HIGH (ref 70–99)

## 2013-09-30 LAB — GLUCOSE, CAPILLARY
Glucose-Capillary: 164 mg/dL — ABNORMAL HIGH (ref 70–99)
Glucose-Capillary: 123 mg/dL — ABNORMAL HIGH (ref 70–99)

## 2013-10-04 LAB — GLUCOSE, CAPILLARY
Glucose-Capillary: 174 mg/dL — ABNORMAL HIGH (ref 70–99)
Glucose-Capillary: 140 mg/dL — ABNORMAL HIGH (ref 70–99)

## 2013-10-05 LAB — GLUCOSE, CAPILLARY
Glucose-Capillary: 149 mg/dL — ABNORMAL HIGH (ref 70–99)
Glucose-Capillary: 114 mg/dL — ABNORMAL HIGH (ref 70–99)

## 2013-10-06 ENCOUNTER — Encounter: Payer: Self-pay | Admitting: Physical Medicine & Rehabilitation

## 2013-10-06 ENCOUNTER — Encounter
Payer: Managed Care, Other (non HMO) | Attending: Physical Medicine & Rehabilitation | Admitting: Physical Medicine & Rehabilitation

## 2013-10-06 VITALS — BP 168/83 | HR 71 | Resp 14 | Ht 74.0 in | Wt 165.0 lb

## 2013-10-06 DIAGNOSIS — Z8509 Personal history of malignant neoplasm of other digestive organs: Secondary | ICD-10-CM | POA: Insufficient documentation

## 2013-10-06 DIAGNOSIS — Z89512 Acquired absence of left leg below knee: Secondary | ICD-10-CM

## 2013-10-06 DIAGNOSIS — N186 End stage renal disease: Secondary | ICD-10-CM | POA: Insufficient documentation

## 2013-10-06 DIAGNOSIS — Z89511 Acquired absence of right leg below knee: Secondary | ICD-10-CM

## 2013-10-06 DIAGNOSIS — E1049 Type 1 diabetes mellitus with other diabetic neurological complication: Secondary | ICD-10-CM | POA: Insufficient documentation

## 2013-10-06 DIAGNOSIS — K219 Gastro-esophageal reflux disease without esophagitis: Secondary | ICD-10-CM | POA: Insufficient documentation

## 2013-10-06 DIAGNOSIS — Z992 Dependence on renal dialysis: Secondary | ICD-10-CM | POA: Insufficient documentation

## 2013-10-06 DIAGNOSIS — S88119A Complete traumatic amputation at level between knee and ankle, unspecified lower leg, initial encounter: Secondary | ICD-10-CM | POA: Insufficient documentation

## 2013-10-06 DIAGNOSIS — E1029 Type 1 diabetes mellitus with other diabetic kidney complication: Secondary | ICD-10-CM | POA: Insufficient documentation

## 2013-10-06 DIAGNOSIS — E119 Type 2 diabetes mellitus without complications: Secondary | ICD-10-CM

## 2013-10-06 DIAGNOSIS — E1142 Type 2 diabetes mellitus with diabetic polyneuropathy: Secondary | ICD-10-CM | POA: Insufficient documentation

## 2013-10-06 DIAGNOSIS — I120 Hypertensive chronic kidney disease with stage 5 chronic kidney disease or end stage renal disease: Secondary | ICD-10-CM | POA: Insufficient documentation

## 2013-10-06 LAB — GLUCOSE, CAPILLARY
GLUCOSE-CAPILLARY: 129 mg/dL — AB (ref 70–99)
Glucose-Capillary: 90 mg/dL (ref 70–99)

## 2013-10-06 NOTE — Progress Notes (Signed)
Subjective:    Patient ID: Paul [name], male    DOB: 1956-02-20, 58 y.o.   MRN: [id number]  HPI   Paul [name] is back regarding his double BKA's.  He had further problems with the wounds and his legs required revision surgery on 4/17. He is now being followed at the wound center and is receiving hyperbaric oxygen. His wife showed me pictures today of his legs which are improving.   His pain is minimal. He no longer really uses the oyxcodone and has taken himself off the gabapentin.   He is doing some stretching at home. He is typically using ACE wraps for compression  Pain Inventory Average Pain 1 Pain Right Now 0 My pain is intermittent and stabbing  In the last 24 hours, has pain interfered with the following? General activity 0 Relation with others 0 Enjoyment of life 0 What TIME of day is your pain at its worst? n/a Sleep (in general) Good  Pain is worse with: n/a Pain improves with: n/a Relief from Meds: n/a  Mobility do you drive?  no use a wheelchair Do you have any goals in this area?  yes  Function disabled: date disabled . I need assistance with the following:  toileting, meal prep and household duties  Neuro/Psych No problems in this area  Prior Studies Any changes since last visit?  no  Physicians involved in your care Chrys Racer   Family History  Problem Relation Age of Onset  . Cancer Mother   . Cancer Father     prostate cancer  . Heart disease Maternal Grandfather   . Stroke Paternal Grandmother    History   Social History  . Marital Status: Married    Spouse Name: N/A    Number of Children: N/A  . Years of Education: N/A   Social History Main Topics  . Smoking status: Never Smoker   . Smokeless tobacco: Never Used  . Alcohol Use: No  . Drug Use: No  . Sexual Activity: None   Other Topics Concern  . None   Social History Narrative  . None   Past Surgical History  Procedure Laterality Date  . Whipple procedure   2002    duodenal ca, Dr. Harlow Asa  . Bone marrow biopsy  2012  . Renal biopsy, percutaneous  2012  . Tonsillectomy    . Portacath placement  2002  . Biliary diversion, external  2002  . Other surgical history      Cyst removed from back  . Av fistula placement  06/07/2011    Procedure: ARTERIOVENOUS (AV) FISTULA CREATION;  Surgeon: Angelia Mould, MD;  Location: [place];  Service: Vascular;  Laterality: Left;  . Ligation of arteriovenous  fistula  05/22/2012    Procedure: LIGATION OF ARTERIOVENOUS  FISTULA;  Surgeon: Mal Misty, MD;  Location: [place];  Service: Vascular;  Laterality: Left;  Left brachio-cephalic fistula  . Insertion of dialysis catheter  05/22/2012    Procedure: INSERTION OF DIALYSIS CATHETER;  Surgeon: Mal Misty, MD;  Location: [place];  Service: Vascular;  Laterality: N/A;  right internal jugular vein  . Av fistula placement  06/18/2012    Procedure: ARTERIOVENOUS (AV) FISTULA CREATION;  Surgeon: Mal Misty, MD;  Location: [place];  Service: Vascular;  Laterality: Right;  [place]  . Colonoscopy      Hx: of  . Capd insertion N/A 01/14/2013    Procedure: LAPAROSCOPIC INSERTION CONTINUOUS AMBULATORY PERITONEAL DIALYSIS  (  CAPD) CATHETER;  Surgeon: Adin Hector, MD;  Location: [place];  Service: General;  Laterality: N/A;  . Vascular surgery    . Removal of a dialysis catheter    . Capd removal N/A 07/09/2013    Procedure: CONTINUOUS AMBULATORY PERITONEAL DIALYSIS  (CAPD) CATHETER REMOVAL;  Surgeon: Adin Hector, MD;  Location: [place];  Service: General;  Laterality: N/A;  . Amputation Bilateral 07/30/2013    Procedure: AMPUTATION BELOW KNEE;  Surgeon: Newt Minion, MD;  Location: [place];  Service: Orthopedics;  Laterality: Bilateral;  Bilateral Below Knee Amputations  . Amputation Bilateral 09/03/2013    Procedure: AMPUTATION BELOW KNEE;  Surgeon: Newt Minion, MD;  Location: [place];  Service: Orthopedics;  Laterality: Bilateral;  Bilateral Below Knee Amputation Revision    Past Medical History  Diagnosis Date  . Open wound of both legs with complication     Pt has had progressive wounds of both LE's including gangrene of the toes and patchy necrosis of the calves.  He has been treated at [facility] by Dr Jerline Pain with hyperbaric O2 5d per week.  He is getting Na Thiosulfate with HD for suspected calciphylaxis. Pt says doctor's aren't sure if these ulcers were diabetic ulcers or calciphylaxis.  He underwent bilat BKA on 07/30/13.  He says that the woun  . ESRD on hemodialysis     Pt has ESRD due to DM.  He had a L upper arm AVF prior to starting HD which was ligated due to L arm steal syndrome.  He started HD in Jan 2014 and did home HD.  The family couldn't cannulate the R arm AVF successfully so by mid 2014 they decided to switch to PD which was done late summer 2014.  In Jan 2015 he was admitted with FTT felt to be due to underdialysis on PD and PD was abandoned and he   . Anemia     esrd  . Hypertension     off of meds due to orthostatic hypotension  . Closed left arm fracture 1967  . Heart murmur   . Corneal abrasion, left   . GERD (gastroesophageal reflux disease)   . History of blood transfusion   . Glomerulosclerosis, diabetic   . Depression   . A-fib   . Calciphylaxis 05/2013    lower ext  . Diabetes mellitus     Type 1 iddm x 11 yrs  . Diabetic neuropathy   . Cancer     hx duodenal adenoCa 2002, resected   BP 168/83  Pulse 71  Resp 14  Ht _0  (1.88 m)  Wt 165 lb (74.844 kg)  BMI 21.18 kg/m2  SpO2 97%  Opioid Risk Score:   Fall Risk Score: High Fall Risk (>13 points) (patient educated handout declined)   Review of Systems  Constitutional: Positive for diaphoresis.  Gastrointestinal: Positive for nausea and vomiting.  Genitourinary: Positive for difficulty urinating.  All other systems reviewed and are negative.      Objective:   Physical Exam Constitutional: He is oriented to person, place, and time. Hair cut, clean  HENT:  oral mucosa pink and moist.  Head: Normocephalic.  Eyes: EOM are normal.  Neck: Normal range of motion. Neck supple. No thyromegaly present.  Cardiovascular: Normal rate and regular rhythm. No murmurs, rubs, or gallops  Respiratory: Effort normal and breath sounds normal. No respiratory distress. No wheezes, rales, or rhonchi  GI: Soft. Bowel sounds are normal. He exhibits no distension.  Neurological: He is alert and oriented to person, place, and time. No cranial nerve deficit. Coordination normal.  Patient follows full commands. Deltoids 4+/5. Biceps, triceps, and hands 4+ to 5/5. LE's HF 4/5, KE4-/KF 4- Skin:  Bilateral BKA sites dressed with ACe---didn't remove.   Psychiatric: He has a normal mood and affect. His behavior is normal. Thought content normal    Assessment/Plan:  1. Bilateral BKA secondary to extensive gangrene/calciphylaxis 07/30/2013 with ongoing wound healing issues---at wound center  -continue ACE wraps for now  -when his wounds are closer to being healed completely, i would recommend changing over to a full stump shrinker  -i would imagine that he's about 2 months away from beginning the fitting process  -I will see him back in about 6 weeks for follow up  -i described several exercises to stretch his hips and knee  -also needs to increase CV stamina

## 2013-10-06 NOTE — Patient Instructions (Signed)
WORK ON INCREASING YOUR TRUNK STRENGTH AND UPPER EXTREMITY STRENGTH.   ALSO IMPROVE YOUR CARDIOVASCULAR STAMINA  ALSO WORK ON YOUR HIP EXTENSION AND KNEE EXTENSION

## 2013-10-07 NOTE — Progress Notes (Signed)
[facility]  NAME:  [name], [name] NO.:  [id number]  MEDICAL RECORD NO.:  [id number]      DATE OF BIRTH:  1956-04-03  PHYSICIAN:  Judene Companion, M.D.      VISIT DATE:  10/06/2013                                  OFFICE VISIT   Paul [name] is the patient I have been involved with for several months now.  He is a 58 year old diabetic who is also on renal dialysis.  This gentleman has had severe problems with peripheral vascular disease, and most recently has had bilateral below-knee amputations, which were followed by sloughage of the flaps and he had to re-go of surgery on his amputation sites and apparently the surgeon feels like the only way that this will heal is to continue with our treatments here at the hyperbaric chamber.  We have been giving him hyperbaric oxygen treatments now for a couple of months and we have made marked improvement in the wounds, especially since the last go around of surgery.  We have also been treating him with silver alginate and a Silvadene ointment.  His stumps are warm and the cellulitis is controlled.  He is presently on doxycycline.  Both the orthopedic surgeon, his peripheral vascular physicians and myself at the wound care believe that we should continue the hyperbaric oxygen treatments as he is still healing these very tenuous wounds and I can say that I have been following him hyperbaric treatments and he is markedly improved since they have begun, so I would like to give him another 30 treatments.     Judene Companion, M.D.     PP/MEDQ  D:  10/06/2013  T:  10/06/2013  Job:  [id number]

## 2013-10-08 LAB — GLUCOSE, CAPILLARY
GLUCOSE-CAPILLARY: 101 mg/dL — AB (ref 70–99)
Glucose-Capillary: 150 mg/dL — ABNORMAL HIGH (ref 70–99)

## 2013-10-12 LAB — GLUCOSE, CAPILLARY
GLUCOSE-CAPILLARY: 171 mg/dL — AB (ref 70–99)
Glucose-Capillary: 230 mg/dL — ABNORMAL HIGH (ref 70–99)

## 2013-10-13 LAB — GLUCOSE, CAPILLARY
Glucose-Capillary: 114 mg/dL — ABNORMAL HIGH (ref 70–99)
Glucose-Capillary: 198 mg/dL — ABNORMAL HIGH (ref 70–99)

## 2013-10-14 LAB — GLUCOSE, CAPILLARY
Glucose-Capillary: 136 mg/dL — ABNORMAL HIGH (ref 70–99)
Glucose-Capillary: 106 mg/dL — ABNORMAL HIGH (ref 70–99)

## 2013-10-15 LAB — GLUCOSE, CAPILLARY
Glucose-Capillary: 203 mg/dL — ABNORMAL HIGH (ref 70–99)
Glucose-Capillary: 129 mg/dL — ABNORMAL HIGH (ref 70–99)

## 2013-10-18 ENCOUNTER — Encounter (HOSPITAL_BASED_OUTPATIENT_CLINIC_OR_DEPARTMENT_OTHER): Payer: Managed Care, Other (non HMO) | Attending: General Surgery

## 2013-10-18 DIAGNOSIS — T85698A Other mechanical complication of other specified internal prosthetic devices, implants and grafts, initial encounter: Secondary | ICD-10-CM | POA: Insufficient documentation

## 2013-10-18 DIAGNOSIS — Y838 Other surgical procedures as the cause of abnormal reaction of the patient, or of later complication, without mention of misadventure at the time of the procedure: Secondary | ICD-10-CM | POA: Insufficient documentation

## 2013-10-18 DIAGNOSIS — S88119A Complete traumatic amputation at level between knee and ankle, unspecified lower leg, initial encounter: Secondary | ICD-10-CM | POA: Insufficient documentation

## 2013-10-18 LAB — GLUCOSE, CAPILLARY
GLUCOSE-CAPILLARY: 192 mg/dL — AB (ref 70–99)
Glucose-Capillary: 209 mg/dL — ABNORMAL HIGH (ref 70–99)

## 2013-10-19 LAB — GLUCOSE, CAPILLARY
GLUCOSE-CAPILLARY: 237 mg/dL — AB (ref 70–99)
Glucose-Capillary: 188 mg/dL — ABNORMAL HIGH (ref 70–99)

## 2013-10-20 LAB — GLUCOSE, CAPILLARY
Glucose-Capillary: 237 mg/dL — ABNORMAL HIGH (ref 70–99)
Glucose-Capillary: 151 mg/dL — ABNORMAL HIGH (ref 70–99)

## 2013-10-21 LAB — GLUCOSE, CAPILLARY
GLUCOSE-CAPILLARY: 287 mg/dL — AB (ref 70–99)
Glucose-Capillary: 163 mg/dL — ABNORMAL HIGH (ref 70–99)

## 2013-10-22 LAB — GLUCOSE, CAPILLARY
GLUCOSE-CAPILLARY: 121 mg/dL — AB (ref 70–99)
Glucose-Capillary: 178 mg/dL — ABNORMAL HIGH (ref 70–99)

## 2013-10-25 LAB — GLUCOSE, CAPILLARY
GLUCOSE-CAPILLARY: 131 mg/dL — AB (ref 70–99)
Glucose-Capillary: 125 mg/dL — ABNORMAL HIGH (ref 70–99)

## 2013-10-26 LAB — GLUCOSE, CAPILLARY
GLUCOSE-CAPILLARY: 235 mg/dL — AB (ref 70–99)
GLUCOSE-CAPILLARY: 180 mg/dL — AB (ref 70–99)

## 2013-10-27 LAB — GLUCOSE, CAPILLARY
Glucose-Capillary: 171 mg/dL — ABNORMAL HIGH (ref 70–99)
Glucose-Capillary: 146 mg/dL — ABNORMAL HIGH (ref 70–99)

## 2013-10-28 LAB — GLUCOSE, CAPILLARY
GLUCOSE-CAPILLARY: 159 mg/dL — AB (ref 70–99)
Glucose-Capillary: 118 mg/dL — ABNORMAL HIGH (ref 70–99)

## 2013-11-01 LAB — GLUCOSE, CAPILLARY
GLUCOSE-CAPILLARY: 169 mg/dL — AB (ref 70–99)
Glucose-Capillary: 211 mg/dL — ABNORMAL HIGH (ref 70–99)
Glucose-Capillary: 125 mg/dL — ABNORMAL HIGH (ref 70–99)
Glucose-Capillary: 129 mg/dL — ABNORMAL HIGH (ref 70–99)

## 2013-11-02 LAB — GLUCOSE, CAPILLARY
GLUCOSE-CAPILLARY: 139 mg/dL — AB (ref 70–99)
Glucose-Capillary: 157 mg/dL — ABNORMAL HIGH (ref 70–99)

## 2013-11-03 LAB — GLUCOSE, CAPILLARY
GLUCOSE-CAPILLARY: 153 mg/dL — AB (ref 70–99)
Glucose-Capillary: 77 mg/dL (ref 70–99)

## 2013-11-04 LAB — GLUCOSE, CAPILLARY
Glucose-Capillary: 167 mg/dL — ABNORMAL HIGH (ref 70–99)
Glucose-Capillary: 191 mg/dL — ABNORMAL HIGH (ref 70–99)

## 2013-11-05 LAB — GLUCOSE, CAPILLARY
Glucose-Capillary: 131 mg/dL — ABNORMAL HIGH (ref 70–99)
Glucose-Capillary: 175 mg/dL — ABNORMAL HIGH (ref 70–99)

## 2013-11-08 LAB — GLUCOSE, CAPILLARY
GLUCOSE-CAPILLARY: 216 mg/dL — AB (ref 70–99)
Glucose-Capillary: 204 mg/dL — ABNORMAL HIGH (ref 70–99)

## 2013-11-09 LAB — GLUCOSE, CAPILLARY
GLUCOSE-CAPILLARY: 137 mg/dL — AB (ref 70–99)
GLUCOSE-CAPILLARY: 195 mg/dL — AB (ref 70–99)

## 2013-11-10 LAB — GLUCOSE, CAPILLARY
GLUCOSE-CAPILLARY: 218 mg/dL — AB (ref 70–99)
Glucose-Capillary: 153 mg/dL — ABNORMAL HIGH (ref 70–99)

## 2013-11-11 LAB — GLUCOSE, CAPILLARY
GLUCOSE-CAPILLARY: 256 mg/dL — AB (ref 70–99)
Glucose-Capillary: 366 mg/dL — ABNORMAL HIGH (ref 70–99)

## 2013-11-12 LAB — GLUCOSE, CAPILLARY
GLUCOSE-CAPILLARY: 118 mg/dL — AB (ref 70–99)
Glucose-Capillary: 183 mg/dL — ABNORMAL HIGH (ref 70–99)

## 2013-11-15 LAB — GLUCOSE, CAPILLARY
GLUCOSE-CAPILLARY: 318 mg/dL — AB (ref 70–99)
Glucose-Capillary: 223 mg/dL — ABNORMAL HIGH (ref 70–99)

## 2013-11-17 ENCOUNTER — Other Ambulatory Visit: Payer: Self-pay

## 2013-11-17 ENCOUNTER — Encounter (HOSPITAL_BASED_OUTPATIENT_CLINIC_OR_DEPARTMENT_OTHER): Payer: Managed Care, Other (non HMO) | Attending: General Surgery

## 2013-11-17 ENCOUNTER — Encounter: Payer: Managed Care, Other (non HMO) | Attending: Physical Medicine & Rehabilitation | Admitting: Registered Nurse

## 2013-11-17 ENCOUNTER — Encounter: Payer: Self-pay | Admitting: Registered Nurse

## 2013-11-17 VITALS — BP 152/65 | HR 69 | Resp 14

## 2013-11-17 DIAGNOSIS — C170 Malignant neoplasm of duodenum: Secondary | ICD-10-CM | POA: Insufficient documentation

## 2013-11-17 DIAGNOSIS — E119 Type 2 diabetes mellitus without complications: Secondary | ICD-10-CM | POA: Insufficient documentation

## 2013-11-17 DIAGNOSIS — N186 End stage renal disease: Secondary | ICD-10-CM | POA: Insufficient documentation

## 2013-11-17 DIAGNOSIS — E1049 Type 1 diabetes mellitus with other diabetic neurological complication: Secondary | ICD-10-CM | POA: Insufficient documentation

## 2013-11-17 DIAGNOSIS — Z89511 Acquired absence of right leg below knee: Secondary | ICD-10-CM

## 2013-11-17 DIAGNOSIS — S88119A Complete traumatic amputation at level between knee and ankle, unspecified lower leg, initial encounter: Secondary | ICD-10-CM | POA: Insufficient documentation

## 2013-11-17 DIAGNOSIS — E7800 Pure hypercholesterolemia, unspecified: Secondary | ICD-10-CM | POA: Insufficient documentation

## 2013-11-17 DIAGNOSIS — I480 Paroxysmal atrial fibrillation: Secondary | ICD-10-CM | POA: Insufficient documentation

## 2013-11-17 DIAGNOSIS — Z89512 Acquired absence of left leg below knee: Secondary | ICD-10-CM

## 2013-11-17 DIAGNOSIS — E1142 Type 2 diabetes mellitus with diabetic polyneuropathy: Secondary | ICD-10-CM | POA: Insufficient documentation

## 2013-11-17 DIAGNOSIS — I120 Hypertensive chronic kidney disease with stage 5 chronic kidney disease or end stage renal disease: Secondary | ICD-10-CM | POA: Insufficient documentation

## 2013-11-17 DIAGNOSIS — E1149 Type 2 diabetes mellitus with other diabetic neurological complication: Secondary | ICD-10-CM

## 2013-11-17 DIAGNOSIS — Z859 Personal history of malignant neoplasm, unspecified: Secondary | ICD-10-CM | POA: Insufficient documentation

## 2013-11-17 DIAGNOSIS — Z8679 Personal history of other diseases of the circulatory system: Secondary | ICD-10-CM | POA: Insufficient documentation

## 2013-11-17 DIAGNOSIS — E1169 Type 2 diabetes mellitus with other specified complication: Secondary | ICD-10-CM | POA: Insufficient documentation

## 2013-11-17 DIAGNOSIS — I959 Hypotension, unspecified: Secondary | ICD-10-CM | POA: Insufficient documentation

## 2013-11-17 DIAGNOSIS — Z8509 Personal history of malignant neoplasm of other digestive organs: Secondary | ICD-10-CM | POA: Insufficient documentation

## 2013-11-17 DIAGNOSIS — K219 Gastro-esophageal reflux disease without esophagitis: Secondary | ICD-10-CM | POA: Insufficient documentation

## 2013-11-17 DIAGNOSIS — L97809 Non-pressure chronic ulcer of other part of unspecified lower leg with unspecified severity: Secondary | ICD-10-CM | POA: Insufficient documentation

## 2013-11-17 DIAGNOSIS — L97909 Non-pressure chronic ulcer of unspecified part of unspecified lower leg with unspecified severity: Secondary | ICD-10-CM | POA: Insufficient documentation

## 2013-11-17 DIAGNOSIS — E785 Hyperlipidemia, unspecified: Secondary | ICD-10-CM | POA: Insufficient documentation

## 2013-11-17 DIAGNOSIS — I739 Peripheral vascular disease, unspecified: Secondary | ICD-10-CM | POA: Insufficient documentation

## 2013-11-17 DIAGNOSIS — E1029 Type 1 diabetes mellitus with other diabetic kidney complication: Secondary | ICD-10-CM | POA: Insufficient documentation

## 2013-11-17 DIAGNOSIS — Z992 Dependence on renal dialysis: Secondary | ICD-10-CM | POA: Insufficient documentation

## 2013-11-17 DIAGNOSIS — E1140 Type 2 diabetes mellitus with diabetic neuropathy, unspecified: Secondary | ICD-10-CM

## 2013-11-17 DIAGNOSIS — N185 Chronic kidney disease, stage 5: Secondary | ICD-10-CM | POA: Insufficient documentation

## 2013-11-17 DIAGNOSIS — I10 Essential (primary) hypertension: Secondary | ICD-10-CM | POA: Insufficient documentation

## 2013-11-17 LAB — GLUCOSE, CAPILLARY
GLUCOSE-CAPILLARY: 140 mg/dL — AB (ref 70–99)
GLUCOSE-CAPILLARY: 128 mg/dL — AB (ref 70–99)
GLUCOSE-CAPILLARY: 166 mg/dL — AB (ref 70–99)
Glucose-Capillary: 253 mg/dL — ABNORMAL HIGH (ref 70–99)

## 2013-11-17 NOTE — Progress Notes (Signed)
Subjective:    Patient ID: Paul [name], male    DOB: Jun 18, 1955, 58 y.o.   MRN: [id number]  HPI: [name] is a 58 year old male who returns for follow up visit. He says at this time he has no pain. At times he has left stump pain. He rates his pain 0. His current exercise regime is performing stretching exercises.  He is still receiving hyperbaric oxygen treatments until 11/26/13. He will be going going to get fitted for his prosthesis on July 8th 1015. He also has an appointment with [facility] to see if he can be placed on the active list for Renal Transplant since his wounds have healed. Wife in the room all questions answered and emotional support given.   Pain Inventory Average Pain 0 Pain Right Now 0 My pain is no pain  In the last 24 hours, has pain interfered with the following? General activity 0 Relation with others 0 Enjoyment of life 0 What TIME of day is your pain at its worst? na Sleep (in general) Good  Pain is worse with: na Pain improves with: na Relief from Meds: no pain meds  Mobility ability to climb steps?  no use a wheelchair transfers alone  Function disabled: date disabled na Do you have any goals in this area?  yes  Neuro/Psych tingling  Prior Studies Any changes since last visit?  no  Physicians involved in your care Any changes since last visit?  no   Family History  Problem Relation Age of Onset  . Cancer Mother   . Cancer Father     prostate cancer  . Heart disease Maternal Grandfather   . Stroke Paternal Grandmother    History   Social History  . Marital Status: Married    Spouse Name: N/A    Number of Children: N/A  . Years of Education: N/A   Social History Main Topics  . Smoking status: Never Smoker   . Smokeless tobacco: Never Used  . Alcohol Use: No  . Drug Use: No  . Sexual Activity: None   Other Topics Concern  . None   Social History Narrative  . None   Past Surgical History  Procedure Laterality  Date  . Whipple procedure  2002    duodenal ca, Dr. Harlow Asa  . Bone marrow biopsy  2012  . Renal biopsy, percutaneous  2012  . Tonsillectomy    . Portacath placement  2002  . Biliary diversion, external  2002  . Other surgical history      Cyst removed from back  . Av fistula placement  06/07/2011    Procedure: ARTERIOVENOUS (AV) FISTULA CREATION;  Surgeon: Angelia Mould, MD;  Location: [place];  Service: Vascular;  Laterality: Left;  . Ligation of arteriovenous  fistula  05/22/2012    Procedure: LIGATION OF ARTERIOVENOUS  FISTULA;  Surgeon: Mal Misty, MD;  Location: [place];  Service: Vascular;  Laterality: Left;  Left brachio-cephalic fistula  . Insertion of dialysis catheter  05/22/2012    Procedure: INSERTION OF DIALYSIS CATHETER;  Surgeon: Mal Misty, MD;  Location: [place];  Service: Vascular;  Laterality: N/A;  right internal jugular vein  . Av fistula placement  06/18/2012    Procedure: ARTERIOVENOUS (AV) FISTULA CREATION;  Surgeon: Mal Misty, MD;  Location: [place];  Service: Vascular;  Laterality: Right;  [place]  . Colonoscopy      Hx: of  . Capd insertion N/A 01/14/2013  Procedure: LAPAROSCOPIC INSERTION CONTINUOUS AMBULATORY PERITONEAL DIALYSIS  (CAPD) CATHETER;  Surgeon: Adin Hector, MD;  Location: [place];  Service: General;  Laterality: N/A;  . Vascular surgery    . Removal of a dialysis catheter    . Capd removal N/A 07/09/2013    Procedure: CONTINUOUS AMBULATORY PERITONEAL DIALYSIS  (CAPD) CATHETER REMOVAL;  Surgeon: Adin Hector, MD;  Location: [place];  Service: General;  Laterality: N/A;  . Amputation Bilateral 07/30/2013    Procedure: AMPUTATION BELOW KNEE;  Surgeon: Newt Minion, MD;  Location: [place];  Service: Orthopedics;  Laterality: Bilateral;  Bilateral Below Knee Amputations  . Amputation Bilateral 09/03/2013    Procedure: AMPUTATION BELOW KNEE;  Surgeon: Newt Minion, MD;  Location: [place];  Service: Orthopedics;  Laterality: Bilateral;  Bilateral  Below Knee Amputation Revision   Past Medical History  Diagnosis Date  . Open wound of both legs with complication     Pt has had progressive wounds of both LE's including gangrene of the toes and patchy necrosis of the calves.  He has been treated at [facility] by Dr Jerline Pain with hyperbaric O2 5d per week.  He is getting Na Thiosulfate with HD for suspected calciphylaxis. Pt says doctor's aren't sure if these ulcers were diabetic ulcers or calciphylaxis.  He underwent bilat BKA on 07/30/13.  He says that the woun  . ESRD on hemodialysis     Pt has ESRD due to DM.  He had a L upper arm AVF prior to starting HD which was ligated due to L arm steal syndrome.  He started HD in Jan 2014 and did home HD.  The family couldn't cannulate the R arm AVF successfully so by mid 2014 they decided to switch to PD which was done late summer 2014.  In Jan 2015 he was admitted with FTT felt to be due to underdialysis on PD and PD was abandoned and he   . Anemia     esrd  . Hypertension     off of meds due to orthostatic hypotension  . Closed left arm fracture 1967  . Heart murmur   . Corneal abrasion, left   . GERD (gastroesophageal reflux disease)   . History of blood transfusion   . Glomerulosclerosis, diabetic   . Depression   . A-fib   . Calciphylaxis 05/2013    lower ext  . Diabetes mellitus     Type 1 iddm x 11 yrs  . Diabetic neuropathy   . Cancer     hx duodenal adenoCa 2002, resected   BP 152/65  Pulse 69  Resp 14  Opioid Risk Score:   Fall Risk Score: High Fall Risk (>13 points) (pt educated on fall risk, brochure given to pt previously)   Review of Systems  Constitutional: Positive for diaphoresis.  Gastrointestinal: Positive for nausea.  Neurological:       Tingling  All other systems reviewed and are negative.      Objective:   Physical Exam  Nursing note and vitals reviewed. Constitutional: He is oriented to person, place, and time. He appears well-developed and  well-nourished.  HENT:  Head: Normocephalic and atraumatic.  Neck: Normal range of motion. Neck supple.  Cardiovascular: Normal rate and regular rhythm.   Pulmonary/Chest: Effort normal and breath sounds normal.  Musculoskeletal:  Normal Muscle Bulk and Muscle Testing Reveals: Upper Extremities: Full ROM and Muscle Strength 5/5 Right arm AVF + bruit and thrill. S/P Fistulogram today with dressing intact.  Lower Extremities: Bilateral BKA Arrived in Wheelchair   Neurological: He is alert and oriented to person, place, and time.  Skin: Skin is warm and dry.  No Open areas on Bilateral stumps.          Assessment & Plan:  1. Bilateral BKA secondary to extensive gangrene/calciphylaxis: Continues with Hyperbaric Oxygen Treatment at the  Wound center. Wearing Bilateral Shrinkers. Has an appointment to have Bilateral prosthesis Fitting. He will start Therapy after he receives his prosthesis.  30 minutes of face to face patient care time was spent during this visit. All questions were encouraged and answered.  F/U in 2 months

## 2013-11-22 LAB — GLUCOSE, CAPILLARY
GLUCOSE-CAPILLARY: 158 mg/dL — AB (ref 70–99)
Glucose-Capillary: 139 mg/dL — ABNORMAL HIGH (ref 70–99)

## 2013-11-23 LAB — GLUCOSE, CAPILLARY
Glucose-Capillary: 239 mg/dL — ABNORMAL HIGH (ref 70–99)
Glucose-Capillary: 182 mg/dL — ABNORMAL HIGH (ref 70–99)
Glucose-Capillary: 189 mg/dL — ABNORMAL HIGH (ref 70–99)

## 2013-11-24 LAB — GLUCOSE, CAPILLARY
Glucose-Capillary: 224 mg/dL — ABNORMAL HIGH (ref 70–99)
Glucose-Capillary: 142 mg/dL — ABNORMAL HIGH (ref 70–99)

## 2013-11-25 LAB — GLUCOSE, CAPILLARY
Glucose-Capillary: 239 mg/dL — ABNORMAL HIGH (ref 70–99)
Glucose-Capillary: 261 mg/dL — ABNORMAL HIGH (ref 70–99)

## 2013-11-30 LAB — GLUCOSE, CAPILLARY
GLUCOSE-CAPILLARY: 154 mg/dL — AB (ref 70–99)
Glucose-Capillary: 177 mg/dL — ABNORMAL HIGH (ref 70–99)

## 2013-12-01 LAB — GLUCOSE, CAPILLARY
GLUCOSE-CAPILLARY: 166 mg/dL — AB (ref 70–99)
GLUCOSE-CAPILLARY: 97 mg/dL (ref 70–99)

## 2013-12-02 LAB — GLUCOSE, CAPILLARY
Glucose-Capillary: 272 mg/dL — ABNORMAL HIGH (ref 70–99)
Glucose-Capillary: 286 mg/dL — ABNORMAL HIGH (ref 70–99)

## 2013-12-09 DIAGNOSIS — Z7682 Awaiting organ transplant status: Secondary | ICD-10-CM | POA: Insufficient documentation

## 2013-12-24 DIAGNOSIS — L98499 Non-pressure chronic ulcer of skin of other sites with unspecified severity: Secondary | ICD-10-CM

## 2013-12-24 DIAGNOSIS — I739 Peripheral vascular disease, unspecified: Secondary | ICD-10-CM | POA: Diagnosis not present

## 2013-12-24 DIAGNOSIS — Z992 Dependence on renal dialysis: Secondary | ICD-10-CM

## 2013-12-24 DIAGNOSIS — S88119A Complete traumatic amputation at level between knee and ankle, unspecified lower leg, initial encounter: Secondary | ICD-10-CM | POA: Diagnosis not present

## 2013-12-24 DIAGNOSIS — E119 Type 2 diabetes mellitus without complications: Secondary | ICD-10-CM

## 2013-12-24 DIAGNOSIS — D62 Acute posthemorrhagic anemia: Secondary | ICD-10-CM | POA: Diagnosis not present

## 2013-12-24 DIAGNOSIS — I4891 Unspecified atrial fibrillation: Secondary | ICD-10-CM | POA: Diagnosis not present

## 2013-12-29 DIAGNOSIS — R9439 Abnormal result of other cardiovascular function study: Secondary | ICD-10-CM | POA: Insufficient documentation

## 2014-01-17 ENCOUNTER — Ambulatory Visit: Payer: Managed Care, Other (non HMO) | Attending: Orthopedic Surgery | Admitting: Physical Therapy

## 2014-01-17 DIAGNOSIS — N186 End stage renal disease: Secondary | ICD-10-CM | POA: Diagnosis not present

## 2014-01-17 DIAGNOSIS — R5381 Other malaise: Secondary | ICD-10-CM | POA: Insufficient documentation

## 2014-01-17 DIAGNOSIS — R269 Unspecified abnormalities of gait and mobility: Secondary | ICD-10-CM | POA: Insufficient documentation

## 2014-01-17 DIAGNOSIS — IMO0001 Reserved for inherently not codable concepts without codable children: Secondary | ICD-10-CM | POA: Diagnosis not present

## 2014-01-17 DIAGNOSIS — S88119A Complete traumatic amputation at level between knee and ankle, unspecified lower leg, initial encounter: Secondary | ICD-10-CM | POA: Insufficient documentation

## 2014-01-17 DIAGNOSIS — E119 Type 2 diabetes mellitus without complications: Secondary | ICD-10-CM | POA: Diagnosis not present

## 2014-01-17 DIAGNOSIS — I120 Hypertensive chronic kidney disease with stage 5 chronic kidney disease or end stage renal disease: Secondary | ICD-10-CM | POA: Diagnosis not present

## 2014-01-17 DIAGNOSIS — I4891 Unspecified atrial fibrillation: Secondary | ICD-10-CM | POA: Diagnosis not present

## 2014-01-19 ENCOUNTER — Encounter: Payer: Managed Care, Other (non HMO) | Attending: Physical Medicine & Rehabilitation | Admitting: Registered Nurse

## 2014-01-19 ENCOUNTER — Encounter: Payer: Self-pay | Admitting: Registered Nurse

## 2014-01-19 VITALS — BP 155/60 | HR 68 | Resp 16 | Wt 160.0 lb

## 2014-01-19 DIAGNOSIS — E1140 Type 2 diabetes mellitus with diabetic neuropathy, unspecified: Secondary | ICD-10-CM

## 2014-01-19 DIAGNOSIS — N186 End stage renal disease: Secondary | ICD-10-CM | POA: Diagnosis not present

## 2014-01-19 DIAGNOSIS — Z8509 Personal history of malignant neoplasm of other digestive organs: Secondary | ICD-10-CM | POA: Diagnosis not present

## 2014-01-19 DIAGNOSIS — K219 Gastro-esophageal reflux disease without esophagitis: Secondary | ICD-10-CM | POA: Insufficient documentation

## 2014-01-19 DIAGNOSIS — S88119A Complete traumatic amputation at level between knee and ankle, unspecified lower leg, initial encounter: Secondary | ICD-10-CM | POA: Diagnosis not present

## 2014-01-19 DIAGNOSIS — E1049 Type 1 diabetes mellitus with other diabetic neurological complication: Secondary | ICD-10-CM | POA: Insufficient documentation

## 2014-01-19 DIAGNOSIS — E1149 Type 2 diabetes mellitus with other diabetic neurological complication: Secondary | ICD-10-CM

## 2014-01-19 DIAGNOSIS — E1029 Type 1 diabetes mellitus with other diabetic kidney complication: Secondary | ICD-10-CM | POA: Insufficient documentation

## 2014-01-19 DIAGNOSIS — Z89512 Acquired absence of left leg below knee: Secondary | ICD-10-CM

## 2014-01-19 DIAGNOSIS — Z992 Dependence on renal dialysis: Secondary | ICD-10-CM | POA: Diagnosis not present

## 2014-01-19 DIAGNOSIS — E1142 Type 2 diabetes mellitus with diabetic polyneuropathy: Secondary | ICD-10-CM | POA: Insufficient documentation

## 2014-01-19 DIAGNOSIS — I120 Hypertensive chronic kidney disease with stage 5 chronic kidney disease or end stage renal disease: Secondary | ICD-10-CM | POA: Insufficient documentation

## 2014-01-19 DIAGNOSIS — Z89511 Acquired absence of right leg below knee: Secondary | ICD-10-CM

## 2014-01-19 NOTE — Progress Notes (Signed)
Subjective:    Patient ID: Paul [name], male    DOB: 1955-11-21, 58 y.o.    HPI: Paul [name] is a 58 year old male who returns for follow up visit. Paul [name] denies any pain at this time. Occasionally has phantom pains in bilateral stumps.  Paul [name] rates his pain 0. His current exercise regime is performing stretching exercises. Paul [name] had his physical therapy evaluation on 01/17/14, will be starting physical therapy on 01/26/14. Paul [name] will be going to therapy twice a week.   Pain Inventory Average Pain 0 Pain Right Now 0 My pain is no pain  In the last 24 hours, has pain interfered with the following? General activity 0 Relation with others 0 Enjoyment of life 0 What TIME of day is your pain at its worst? no pain Sleep (in general) Good  Pain is worse with: no pain Pain improves with: no pain Relief from Meds: no pain meds  Mobility use a walker ability to climb steps?  no do you drive?  no use a wheelchair transfers alone  Function disabled: date disabled na I need assistance with the following:  toileting, meal prep and shopping  Neuro/Psych trouble walking  Prior Studies Any changes since last visit?  no  Physicians involved in your care Any changes since last visit?  no   Family History  Problem Relation Age of Onset  . Cancer Mother   . Cancer Father     prostate cancer  . Heart disease Maternal Grandfather   . Stroke Paternal Grandmother    History   Social History  . Marital Status: Married    Spouse Name: N/A    Number of Children: N/A  . Years of Education: N/A   Social History Main Topics  . Smoking status: Never Smoker   . Smokeless tobacco: Never Used  . Alcohol Use: No  . Drug Use: No  . Sexual Activity: None   Other Topics Concern  . None   Social History Narrative  . None   Past Surgical History  Procedure Laterality Date  . Whipple procedure  2002    duodenal ca, Dr. Harlow Asa  . Bone marrow biopsy  2012  . Renal biopsy, percutaneous   2012  . Tonsillectomy    . Portacath placement  2002  . Biliary diversion, external  2002  . Other surgical history      Cyst removed from back  . Av fistula placement  06/07/2011    Procedure: ARTERIOVENOUS (AV) FISTULA CREATION;  Surgeon: Angelia Mould, MD;  Location: [place];  Service: Vascular;  Laterality: Left;  . Ligation of arteriovenous  fistula  05/22/2012    Procedure: LIGATION OF ARTERIOVENOUS  FISTULA;  Surgeon: Mal Misty, MD;  Location: [place];  Service: Vascular;  Laterality: Left;  Left brachio-cephalic fistula  . Insertion of dialysis catheter  05/22/2012    Procedure: INSERTION OF DIALYSIS CATHETER;  Surgeon: Mal Misty, MD;  Location: [place];  Service: Vascular;  Laterality: N/A;  right internal jugular vein  . Av fistula placement  06/18/2012    Procedure: ARTERIOVENOUS (AV) FISTULA CREATION;  Surgeon: Mal Misty, MD;  Location: [place];  Service: Vascular;  Laterality: Right;  [place]  . Colonoscopy      Hx: of  . Capd insertion N/A 01/14/2013    Procedure: LAPAROSCOPIC INSERTION CONTINUOUS AMBULATORY PERITONEAL DIALYSIS  (CAPD) CATHETER;  Surgeon: Adin Hector, MD;  Location: [place];  Service: General;  Laterality:  N/A;  . Vascular surgery    . Removal of a dialysis catheter    . Capd removal N/A 07/09/2013    Procedure: CONTINUOUS AMBULATORY PERITONEAL DIALYSIS  (CAPD) CATHETER REMOVAL;  Surgeon: Adin Hector, MD;  Location: [place];  Service: General;  Laterality: N/A;  . Amputation Bilateral 07/30/2013    Procedure: AMPUTATION BELOW KNEE;  Surgeon: Newt Minion, MD;  Location: [place];  Service: Orthopedics;  Laterality: Bilateral;  Bilateral Below Knee Amputations  . Amputation Bilateral 09/03/2013    Procedure: AMPUTATION BELOW KNEE;  Surgeon: Newt Minion, MD;  Location: [place];  Service: Orthopedics;  Laterality: Bilateral;  Bilateral Below Knee Amputation Revision   Past Medical History  Diagnosis Date  . Open wound of both legs with complication      Pt has had progressive wounds of both LE's including gangrene of the toes and patchy necrosis of the calves.  Paul [name] has been treated at [facility] by Dr Jerline Pain with hyperbaric O2 5d per week.  Paul [name] is getting Na Thiosulfate with HD for suspected calciphylaxis. Pt says doctor's aren't sure if these ulcers were diabetic ulcers or calciphylaxis.  Paul [name] underwent bilat BKA on 07/30/13.  Paul [name] says that the woun  . ESRD on hemodialysis     Pt has ESRD due to DM.  Paul [name] had a L upper arm AVF prior to starting HD which was ligated due to L arm steal syndrome.  Paul [name] started HD in Jan 2014 and did home HD.  The family couldn't cannulate the R arm AVF successfully so by mid 2014 they decided to switch to PD which was done late summer 2014.  In Jan 2015 Paul [name] was admitted with FTT felt to be due to underdialysis on PD and PD was abandoned and Paul [name]   . Anemia     esrd  . Hypertension     off of meds due to orthostatic hypotension  . Closed left arm fracture 1967  . Heart murmur   . Corneal abrasion, left   . GERD (gastroesophageal reflux disease)   . History of blood transfusion   . Glomerulosclerosis, diabetic   . Depression   . A-fib   . Calciphylaxis 05/2013    lower ext  . Diabetes mellitus     Type 1 iddm x 11 yrs  . Diabetic neuropathy   . Cancer     hx duodenal adenoCa 2002, resected   BP 155/60  Pulse 68  Resp 16  Wt 160 lb (72.576 kg)  SpO2 99%  Opioid Risk Score:   Fall Risk Score:      Review of Systems  Musculoskeletal: Positive for gait problem.  All other systems reviewed and are negative.      Objective:   Physical Exam  Nursing note and vitals reviewed. Constitutional: Paul [name] is oriented to person, place, and time. Paul [name] appears well-developed.  HENT:  Head: Normocephalic and atraumatic.  Neck: Normal range of motion. Neck supple.  Cardiovascular: Normal rate and regular rhythm.   Pulmonary/Chest: Effort normal and breath sounds normal.  Musculoskeletal:  Normal Muscle Bulk to Upper  Extremities/ Muscle strength 5/5 Lower Extremities: Bilateral BKA's Arrived in wheelchair  Neurological: Paul [name] is alert and oriented to person, place, and time.  Skin: Skin is warm and dry.  Psychiatric: Paul [name] has a normal mood and affect.          Assessment & Plan:  1. Bilateral BKA secondary to extensivgangrene/calciphylaxis:  Wearing Bilateral Shrinkers. Paul [name] will start Physical  Therapy on 01/26/14.  15 minutes of face to face patient care time was spent during this visit. All questions were encouraged and answered.   F/U in 2 months

## 2014-01-26 ENCOUNTER — Ambulatory Visit: Payer: Managed Care, Other (non HMO) | Attending: Orthopedic Surgery | Admitting: Physical Therapy

## 2014-01-26 DIAGNOSIS — I120 Hypertensive chronic kidney disease with stage 5 chronic kidney disease or end stage renal disease: Secondary | ICD-10-CM | POA: Insufficient documentation

## 2014-01-26 DIAGNOSIS — R269 Unspecified abnormalities of gait and mobility: Secondary | ICD-10-CM | POA: Diagnosis not present

## 2014-01-26 DIAGNOSIS — E119 Type 2 diabetes mellitus without complications: Secondary | ICD-10-CM | POA: Insufficient documentation

## 2014-01-26 DIAGNOSIS — R5381 Other malaise: Secondary | ICD-10-CM | POA: Insufficient documentation

## 2014-01-26 DIAGNOSIS — IMO0001 Reserved for inherently not codable concepts without codable children: Secondary | ICD-10-CM | POA: Diagnosis not present

## 2014-01-26 DIAGNOSIS — N186 End stage renal disease: Secondary | ICD-10-CM | POA: Diagnosis not present

## 2014-01-26 DIAGNOSIS — I4891 Unspecified atrial fibrillation: Secondary | ICD-10-CM | POA: Insufficient documentation

## 2014-01-26 DIAGNOSIS — S88119A Complete traumatic amputation at level between knee and ankle, unspecified lower leg, initial encounter: Secondary | ICD-10-CM | POA: Diagnosis not present

## 2014-01-28 ENCOUNTER — Ambulatory Visit: Payer: Managed Care, Other (non HMO) | Admitting: Physical Therapy

## 2014-01-28 DIAGNOSIS — IMO0001 Reserved for inherently not codable concepts without codable children: Secondary | ICD-10-CM | POA: Diagnosis not present

## 2014-01-31 ENCOUNTER — Ambulatory Visit: Payer: Managed Care, Other (non HMO) | Admitting: Physical Therapy

## 2014-01-31 DIAGNOSIS — IMO0001 Reserved for inherently not codable concepts without codable children: Secondary | ICD-10-CM | POA: Diagnosis not present

## 2014-02-02 ENCOUNTER — Ambulatory Visit: Payer: Managed Care, Other (non HMO) | Admitting: Physical Therapy

## 2014-02-02 DIAGNOSIS — IMO0001 Reserved for inherently not codable concepts without codable children: Secondary | ICD-10-CM | POA: Diagnosis not present

## 2014-02-07 ENCOUNTER — Ambulatory Visit: Payer: Managed Care, Other (non HMO) | Admitting: Physical Therapy

## 2014-02-07 DIAGNOSIS — IMO0001 Reserved for inherently not codable concepts without codable children: Secondary | ICD-10-CM | POA: Diagnosis not present

## 2014-02-08 DIAGNOSIS — Z0279 Encounter for issue of other medical certificate: Secondary | ICD-10-CM

## 2014-02-09 ENCOUNTER — Ambulatory Visit: Payer: Managed Care, Other (non HMO) | Admitting: Physical Therapy

## 2014-02-09 DIAGNOSIS — IMO0001 Reserved for inherently not codable concepts without codable children: Secondary | ICD-10-CM | POA: Diagnosis not present

## 2014-02-14 ENCOUNTER — Ambulatory Visit: Payer: Managed Care, Other (non HMO) | Admitting: Physical Therapy

## 2014-02-14 DIAGNOSIS — IMO0001 Reserved for inherently not codable concepts without codable children: Secondary | ICD-10-CM | POA: Diagnosis not present

## 2014-02-16 ENCOUNTER — Ambulatory Visit: Payer: Managed Care, Other (non HMO) | Admitting: Physical Therapy

## 2014-02-16 DIAGNOSIS — IMO0001 Reserved for inherently not codable concepts without codable children: Secondary | ICD-10-CM | POA: Diagnosis not present

## 2014-02-17 ENCOUNTER — Telehealth: Payer: Self-pay

## 2014-02-17 NOTE — Telephone Encounter (Signed)
Received fax from [facility].Dr.Jordan advised patient does not need SBE prophylaxis.Form faxed back to fax # [phone].

## 2014-02-21 ENCOUNTER — Encounter: Payer: Managed Care, Other (non HMO) | Admitting: Physical Therapy

## 2014-02-22 ENCOUNTER — Other Ambulatory Visit: Payer: Self-pay | Admitting: Family Medicine

## 2014-02-22 ENCOUNTER — Ambulatory Visit
Admission: RE | Admit: 2014-02-22 | Discharge: 2014-02-22 | Disposition: A | Discharge status: Home / Self Care | Payer: Managed Care, Other (non HMO) | Source: Ambulatory Visit | Attending: Family Medicine | Admitting: Family Medicine

## 2014-02-22 DIAGNOSIS — W19XXXA Unspecified fall, initial encounter: Secondary | ICD-10-CM

## 2014-02-22 DIAGNOSIS — M545 Low back pain: Secondary | ICD-10-CM

## 2014-02-23 ENCOUNTER — Encounter: Payer: Managed Care, Other (non HMO) | Admitting: Physical Therapy

## 2014-02-28 ENCOUNTER — Ambulatory Visit: Payer: Managed Care, Other (non HMO) | Attending: Orthopedic Surgery | Admitting: Physical Therapy

## 2014-02-28 DIAGNOSIS — R5381 Other malaise: Secondary | ICD-10-CM | POA: Insufficient documentation

## 2014-02-28 DIAGNOSIS — R269 Unspecified abnormalities of gait and mobility: Secondary | ICD-10-CM | POA: Diagnosis not present

## 2014-02-28 DIAGNOSIS — N186 End stage renal disease: Secondary | ICD-10-CM | POA: Diagnosis not present

## 2014-02-28 DIAGNOSIS — Z89512 Acquired absence of left leg below knee: Secondary | ICD-10-CM | POA: Diagnosis not present

## 2014-02-28 DIAGNOSIS — I4891 Unspecified atrial fibrillation: Secondary | ICD-10-CM | POA: Insufficient documentation

## 2014-02-28 DIAGNOSIS — E119 Type 2 diabetes mellitus without complications: Secondary | ICD-10-CM | POA: Diagnosis not present

## 2014-02-28 DIAGNOSIS — I120 Hypertensive chronic kidney disease with stage 5 chronic kidney disease or end stage renal disease: Secondary | ICD-10-CM | POA: Diagnosis not present

## 2014-02-28 DIAGNOSIS — Z89511 Acquired absence of right leg below knee: Secondary | ICD-10-CM | POA: Insufficient documentation

## 2014-03-02 ENCOUNTER — Ambulatory Visit: Payer: Managed Care, Other (non HMO) | Admitting: Physical Therapy

## 2014-03-02 DIAGNOSIS — R269 Unspecified abnormalities of gait and mobility: Secondary | ICD-10-CM | POA: Diagnosis not present

## 2014-03-07 ENCOUNTER — Encounter: Payer: Managed Care, Other (non HMO) | Admitting: Physical Therapy

## 2014-03-09 ENCOUNTER — Ambulatory Visit: Payer: Managed Care, Other (non HMO) | Admitting: Physical Therapy

## 2014-03-09 DIAGNOSIS — R269 Unspecified abnormalities of gait and mobility: Secondary | ICD-10-CM | POA: Diagnosis not present

## 2014-03-11 ENCOUNTER — Ambulatory Visit: Payer: Managed Care, Other (non HMO) | Admitting: Physical Therapy

## 2014-03-11 DIAGNOSIS — R269 Unspecified abnormalities of gait and mobility: Secondary | ICD-10-CM | POA: Diagnosis not present

## 2014-03-14 ENCOUNTER — Encounter: Payer: Managed Care, Other (non HMO) | Admitting: Physical Therapy

## 2014-03-16 ENCOUNTER — Ambulatory Visit: Payer: Managed Care, Other (non HMO) | Admitting: Physical Therapy

## 2014-03-16 DIAGNOSIS — R269 Unspecified abnormalities of gait and mobility: Secondary | ICD-10-CM | POA: Diagnosis not present

## 2014-03-18 ENCOUNTER — Ambulatory Visit: Payer: Managed Care, Other (non HMO) | Admitting: Physical Therapy

## 2014-03-18 DIAGNOSIS — R269 Unspecified abnormalities of gait and mobility: Secondary | ICD-10-CM | POA: Diagnosis not present

## 2014-03-21 ENCOUNTER — Encounter: Payer: Self-pay | Admitting: Registered Nurse

## 2014-03-21 ENCOUNTER — Ambulatory Visit: Payer: Managed Care, Other (non HMO) | Attending: Family Medicine

## 2014-03-21 ENCOUNTER — Encounter: Payer: Managed Care, Other (non HMO) | Attending: Physical Medicine & Rehabilitation | Admitting: Registered Nurse

## 2014-03-21 VITALS — BP 140/59 | HR 69 | Resp 14 | Ht 70.0 in | Wt 165.0 lb

## 2014-03-21 DIAGNOSIS — I4891 Unspecified atrial fibrillation: Secondary | ICD-10-CM | POA: Insufficient documentation

## 2014-03-21 DIAGNOSIS — R269 Unspecified abnormalities of gait and mobility: Secondary | ICD-10-CM | POA: Diagnosis not present

## 2014-03-21 DIAGNOSIS — Z89512 Acquired absence of left leg below knee: Secondary | ICD-10-CM | POA: Insufficient documentation

## 2014-03-21 DIAGNOSIS — M545 Low back pain: Secondary | ICD-10-CM | POA: Diagnosis not present

## 2014-03-21 DIAGNOSIS — I120 Hypertensive chronic kidney disease with stage 5 chronic kidney disease or end stage renal disease: Secondary | ICD-10-CM | POA: Insufficient documentation

## 2014-03-21 DIAGNOSIS — E119 Type 2 diabetes mellitus without complications: Secondary | ICD-10-CM | POA: Diagnosis not present

## 2014-03-21 DIAGNOSIS — N186 End stage renal disease: Secondary | ICD-10-CM | POA: Diagnosis not present

## 2014-03-21 DIAGNOSIS — Z89511 Acquired absence of right leg below knee: Secondary | ICD-10-CM | POA: Insufficient documentation

## 2014-03-21 DIAGNOSIS — R5381 Other malaise: Secondary | ICD-10-CM | POA: Insufficient documentation

## 2014-03-21 DIAGNOSIS — E1040 Type 1 diabetes mellitus with diabetic neuropathy, unspecified: Secondary | ICD-10-CM | POA: Diagnosis not present

## 2014-03-21 DIAGNOSIS — R52 Pain, unspecified: Secondary | ICD-10-CM | POA: Diagnosis present

## 2014-03-21 DIAGNOSIS — E1140 Type 2 diabetes mellitus with diabetic neuropathy, unspecified: Secondary | ICD-10-CM

## 2014-03-21 DIAGNOSIS — K219 Gastro-esophageal reflux disease without esophagitis: Secondary | ICD-10-CM | POA: Diagnosis not present

## 2014-03-21 DIAGNOSIS — I10 Essential (primary) hypertension: Secondary | ICD-10-CM | POA: Insufficient documentation

## 2014-03-21 NOTE — Progress Notes (Signed)
Subjective:    Patient ID: Paul [name], male    DOB: 13-Jun-1955, 58 y.o.   MRN: [id number]  HPI: [name] is a 58 year old male who returns for follow up visit. He says his pain is usually located in his lower back. At this time he is pain free. He rates his pain 0. His current exercise regime is performing stretching exercises, walking with walker and attending physical therapytwice a week. He has a small reddened area on lateral fibular side, not open. Close observation. He has been in touch with Administrator. He's wearing bilateral prosthesis today, walking with his walker. He states" he had a fall in September he was walking in the living room, had too much weight moving forward and fell backwards landed on his back. He sought medical attention at [facility], PCP and Dr. Mayer Camel at [facility]. Had Lumbar Spine X-ray 2-3 Views:  Mild spondylosis of the lumbar spine with moderate compression fracture of L3 and mild compression deformity of the superiorendplate of L4 as these findings are new since the previous exam.  He's hoping to return to work after Thanksgiving, he's an Chief Financial Officer.  Pain Inventory Average Pain 1 Pain Right Now 0 My pain is intermittent  In the last 24 hours, has pain interfered with the following? General activity 0 Relation with others 0 Enjoyment of life 0 What TIME of day is your pain at its worst? varies Sleep (in general) Good  Pain is worse with: bending and standing Pain improves with: rest Relief from Meds: n/a  Mobility walk with assistance how many minutes can you walk? 10 ability to climb steps?  yes do you drive?  no use a wheelchair transfers alone Do you have any goals in this area?  yes  Function disabled: date disabled 04/2013 I need assistance with the following:  meal prep, household duties and shopping Do you have any goals in this area?  yes  Neuro/Psych No problems in this area  Prior Studies Any  changes since last visit?  no  Physicians involved in your care Any changes since last visit?  yes Primary care Dr. Mayer Camel   Family History  Problem Relation Age of Onset  . Cancer Mother   . Cancer Father     prostate cancer  . Heart disease Maternal Grandfather   . Stroke Paternal Grandmother    History   Social History  . Marital Status: Married    Spouse Name: N/A    Number of Children: N/A  . Years of Education: N/A   Social History Main Topics  . Smoking status: Never Smoker   . Smokeless tobacco: Never Used  . Alcohol Use: No  . Drug Use: No  . Sexual Activity: None   Other Topics Concern  . None   Social History Narrative   Past Surgical History  Procedure Laterality Date  . Whipple procedure  2002    duodenal ca, Dr. Harlow Asa  . Bone marrow biopsy  2012  . Renal biopsy, percutaneous  2012  . Tonsillectomy    . Portacath placement  2002  . Biliary diversion, external  2002  . Other surgical history      Cyst removed from back  . Av fistula placement  06/07/2011    Procedure: ARTERIOVENOUS (AV) FISTULA CREATION;  Surgeon: Angelia Mould, MD;  Location: [place];  Service: Vascular;  Laterality: Left;  . Ligation of arteriovenous  fistula  05/22/2012    Procedure:  LIGATION OF ARTERIOVENOUS  FISTULA;  Surgeon: Mal Misty, MD;  Location: [place];  Service: Vascular;  Laterality: Left;  Left brachio-cephalic fistula  . Insertion of dialysis catheter  05/22/2012    Procedure: INSERTION OF DIALYSIS CATHETER;  Surgeon: Mal Misty, MD;  Location: [place];  Service: Vascular;  Laterality: N/A;  right internal jugular vein  . Av fistula placement  06/18/2012    Procedure: ARTERIOVENOUS (AV) FISTULA CREATION;  Surgeon: Mal Misty, MD;  Location: [place];  Service: Vascular;  Laterality: Right;  [place]  . Colonoscopy      Hx: of  . Capd insertion N/A 01/14/2013    Procedure: LAPAROSCOPIC INSERTION CONTINUOUS AMBULATORY PERITONEAL DIALYSIS  (CAPD) CATHETER;   Surgeon: Adin Hector, MD;  Location: [place];  Service: General;  Laterality: N/A;  . Vascular surgery    . Removal of a dialysis catheter    . Capd removal N/A 07/09/2013    Procedure: CONTINUOUS AMBULATORY PERITONEAL DIALYSIS  (CAPD) CATHETER REMOVAL;  Surgeon: Adin Hector, MD;  Location: [place];  Service: General;  Laterality: N/A;  . Amputation Bilateral 07/30/2013    Procedure: AMPUTATION BELOW KNEE;  Surgeon: Newt Minion, MD;  Location: [place];  Service: Orthopedics;  Laterality: Bilateral;  Bilateral Below Knee Amputations  . Amputation Bilateral 09/03/2013    Procedure: AMPUTATION BELOW KNEE;  Surgeon: Newt Minion, MD;  Location: [place];  Service: Orthopedics;  Laterality: Bilateral;  Bilateral Below Knee Amputation Revision   Past Medical History  Diagnosis Date  . Open wound of both legs with complication     Pt has had progressive wounds of both LE's including gangrene of the toes and patchy necrosis of the calves.  He has been treated at [facility] by Dr Jerline Pain with hyperbaric O2 5d per week.  He is getting Na Thiosulfate with HD for suspected calciphylaxis. Pt says doctor's aren't sure if these ulcers were diabetic ulcers or calciphylaxis.  He underwent bilat BKA on 07/30/13.  He says that the woun  . ESRD on hemodialysis     Pt has ESRD due to DM.  He had a L upper arm AVF prior to starting HD which was ligated due to L arm steal syndrome.  He started HD in Jan 2014 and did home HD.  The family couldn't cannulate the R arm AVF successfully so by mid 2014 they decided to switch to PD which was done late summer 2014.  In Jan 2015 he was admitted with FTT felt to be due to underdialysis on PD and PD was abandoned and he   . Anemia     esrd  . Hypertension     off of meds due to orthostatic hypotension  . Closed left arm fracture 1967  . Heart murmur   . Corneal abrasion, left   . GERD (gastroesophageal reflux disease)   . History of blood transfusion   . Glomerulosclerosis,  diabetic   . Depression   . A-fib   . Calciphylaxis 05/2013    lower ext  . Diabetes mellitus     Type 1 iddm x 11 yrs  . Diabetic neuropathy   . Cancer     hx duodenal adenoCa 2002, resected   BP 140/59 mmHg  Pulse 69  Resp 14  Ht 5' 10" (1.778 m)  Wt 165 lb (74.844 kg)  BMI 23.68 kg/m2  SpO2 98%  Opioid Risk Score:   Fall Risk Score: Moderate Fall Risk (6-13 points)  Review of Systems     Objective:   Physical Exam  Constitutional: He is oriented to person, place, and time. He appears well-developed and well-nourished.  HENT:  Head: Normocephalic and atraumatic.  Neck: Normal range of motion. Neck supple.  Cardiovascular: Normal rate and regular rhythm.   Pulmonary/Chest: Effort normal and breath sounds normal.  Musculoskeletal:  Normal Muscle Bulk and Muscle testing Reveals: Upper Extremities: Full ROM and Muscle Strength 5/5 Back without spinal or paraspinal tenderness Wearing Lumbar Corset( as Prescribed) Lower Extremities: Bilateral BKA's Walking with [place] Rile Wide Based Gait   Neurological: He is alert and oriented to person, place, and time.  Skin: Skin is warm and dry.  Psychiatric: He has a normal mood and affect.  Nursing note and vitals reviewed.         Assessment & Plan:  1. Bilateral BKA secondary toextensive gangrene/calciphylaxis: Wearing Bilateral Prosthesis.  Attending Physical Therapy Twice a week. Continue Exercise regime  15 minutes of face to face patient care time was spent during this visit. All questions were encouraged and answered.   F/U in 2 months

## 2014-03-21 NOTE — Therapy (Signed)
Physical Therapy Treatment  Patient Details  Name: Paul [name] MRN: [id number] Date of Birth: 12/06/1955  Encounter Date: 03/21/2014      PT End of Session - 03/21/14 1434    Visit Number 16  16/20   Number of Visits 40   PT Start Time 1318   PT Stop Time 1400   PT Time Calculation (min) 42 min   Equipment Utilized During Treatment Gait belt   Activity Tolerance Patient tolerated treatment well      Past Medical History  Diagnosis Date  . Open wound of both legs with complication     Pt has had progressive wounds of both LE's including gangrene of the toes and patchy necrosis of the calves.  He has been treated at [facility] by Dr Jerline Pain with hyperbaric O2 5d per week.  He is getting Na Thiosulfate with HD for suspected calciphylaxis. Pt says doctor's aren't sure if these ulcers were diabetic ulcers or calciphylaxis.  He underwent bilat BKA on 07/30/13.  He says that the woun  . ESRD on hemodialysis     Pt has ESRD due to DM.  He had a L upper arm AVF prior to starting HD which was ligated due to L arm steal syndrome.  He started HD in Jan 2014 and did home HD.  The family couldn't cannulate the R arm AVF successfully so by mid 2014 they decided to switch to PD which was done late summer 2014.  In Jan 2015 he was admitted with FTT felt to be due to underdialysis on PD and PD was abandoned and he   . Anemia     esrd  . Hypertension     off of meds due to orthostatic hypotension  . Closed left arm fracture 1967  . Heart murmur   . Corneal abrasion, left   . GERD (gastroesophageal reflux disease)   . History of blood transfusion   . Glomerulosclerosis, diabetic   . Depression   . A-fib   . Calciphylaxis 05/2013    lower ext  . Diabetes mellitus     Type 1 iddm x 11 yrs  . Diabetic neuropathy   . Cancer     hx duodenal adenoCa 2002, resected    Past Surgical History  Procedure Laterality Date  . Whipple procedure  2002    duodenal ca, Dr. Harlow Asa  . Bone marrow biopsy   2012  . Renal biopsy, percutaneous  2012  . Tonsillectomy    . Portacath placement  2002  . Biliary diversion, external  2002  . Other surgical history      Cyst removed from back  . Av fistula placement  06/07/2011    Procedure: ARTERIOVENOUS (AV) FISTULA CREATION;  Surgeon: Angelia Mould, MD;  Location: [place];  Service: Vascular;  Laterality: Left;  . Ligation of arteriovenous  fistula  05/22/2012    Procedure: LIGATION OF ARTERIOVENOUS  FISTULA;  Surgeon: Mal Misty, MD;  Location: [place];  Service: Vascular;  Laterality: Left;  Left brachio-cephalic fistula  . Insertion of dialysis catheter  05/22/2012    Procedure: INSERTION OF DIALYSIS CATHETER;  Surgeon: Mal Misty, MD;  Location: [place];  Service: Vascular;  Laterality: N/A;  right internal jugular vein  . Av fistula placement  06/18/2012    Procedure: ARTERIOVENOUS (AV) FISTULA CREATION;  Surgeon: Mal Misty, MD;  Location: [place];  Service: Vascular;  Laterality: Right;  [place]  . Colonoscopy  Hx: of  . Capd insertion N/A 01/14/2013    Procedure: LAPAROSCOPIC INSERTION CONTINUOUS AMBULATORY PERITONEAL DIALYSIS  (CAPD) CATHETER;  Surgeon: Adin Hector, MD;  Location: [place];  Service: General;  Laterality: N/A;  . Vascular surgery    . Removal of a dialysis catheter    . Capd removal N/A 07/09/2013    Procedure: CONTINUOUS AMBULATORY PERITONEAL DIALYSIS  (CAPD) CATHETER REMOVAL;  Surgeon: Adin Hector, MD;  Location: [place];  Service: General;  Laterality: N/A;  . Amputation Bilateral 07/30/2013    Procedure: AMPUTATION BELOW KNEE;  Surgeon: Newt Minion, MD;  Location: [place];  Service: Orthopedics;  Laterality: Bilateral;  Bilateral Below Knee Amputations  . Amputation Bilateral 09/03/2013    Procedure: AMPUTATION BELOW KNEE;  Surgeon: Newt Minion, MD;  Location: [employer];  Service: Orthopedics;  Laterality: Bilateral;  Bilateral Below Knee Amputation Revision    There were no vitals taken for this  visit.  Visit Diagnosis:  Abnormality of gait  Debility          Adult PT Treatment/Exercise - 03/21/14 0700    Transfers Sit to Stand;Stand to Sit   Sit to Stand 4: Min guard  Min guard for balance and safety.   Sit to Stand Details (indicate cue type and reason) VCs for sequencing.   Stand to Sit 3: Mod assist;With upper extremity assist;To chair/3-in-1   Stand to Sit Details Mod A during stand to sit to improve eccentric control.   Ambulation/Gait Yes   Ambulation/Gait Assistance 4: Min guard   Ambulation Distance (Feet) 50 Feet   Assistive device Lofstrands   Gait Pattern Step-through pattern;Decreased stride length;Trunk flexed  Increased L foot ER/medial whip.   Stairs Yes   Stairs Assistance 4: Min guard;4: Min assist  min A during one [place] Details (indicate cue type and reason) VCs for sequencing.   Stair Management Technique Forwards;With crutches  forearm crutches, with and without rails   Number of Stairs 6  2, 3" steps and 4,4"steps with one forearm crutch/1 handrail   Height of Stairs 2  inches   Ramp 3: Mod assist  Min guard,and Mod A to maintain balance prior to rest break.   Ramp Details (indicate cue type and reason) VCs for sequencing, pt required seated rest break due to fatigue and L LE pain.   Stairs Assistance Details Tactile cues for sequencing;Verbal cues for sequencing  Min guard for safety, with Mod A during one LOB episode.            PT Short Term Goals - 03/21/14 1459    Title Pt will be able to: Ambulate 500 ft. with forearm crutches and prostheses with supervision.   Time 4   Period Weeks   Status On-going   Title Pt will be able to: Ambulate 100 ft. on uneven (grass) sufaces with forearm crutches and prostheses with contact assist.   Time 4   Period Weeks   Status On-going   Title Pt will be at to:  Perform standing activities for >15 minutes with prostheses with back pain </= 2/10.    Time 4   Period Weeks    Status On-going   Title Pt will be able to: reaches 5" & looks over shoulders with support of 1 crutch modified independent.   Time 4   Period Weeks   Status On-going   Title Pt will be able to: negotiate ramp & curb with forearm crutches & prostheses  with minimal assist.   Time 4   Period Weeks   Status On-going          PT Long Term Goals - 03/21/14 1501    Title Pt will be able to: Ambulate 584ft with forearm crutches & prostheses modified independent.   Time 3   Period Months   Status On-going   Title Pt will be able to: ambulate 200' on uneven (grass) surfaces with forearm crutches & prostheses modified independent.   Time 3   Period Months   Status On-going   Title Pt will be able to: negotiate ramp, curb, stairs with forearm crutches & prostheses modified independent.   Time 3   Period Months   Status On-going   Title Pt will be able to: perform standing activities for > 20 minutes & prostheses with no pain or discomfort.   Time 3   Period Months   Status On-going   Title Pt will be able to: sit to/from stand from chairs without armrest pushing with UEs, reaches 5" & looks over shoulders without UE support safely.   Time 3   Period Months   Status On-going   Additional Long Term Goals Yes   Title Pt will be able to: self-report via FOTO improvement >10points in Functional Status Measure   Time 3   Period Months   Status On-going          Plan - 03/21/14 1443    Clinical Impression Statement Pt continues to be limited by decreased strength, balance, and endurance, as he required assist to maintain balance while traversing ramp and stairs. Pt also required seated rest breaks due to fatigue. Pt continues to require frequent VC's for proper sequencing while negotiating steps. Pt would continue to benefit from skilled therapy to improve safety during functional mobliity.   Pt will benefit from skilled therapeutic intervention in order to improve on the following  deficits Abnormal gait;Decreased activity tolerance;Decreased balance;Decreased strength;Difficulty walking;Decreased knowledge of use of DME;Other (comment)  prosthetic dependency   Rehab Potential Good   PT Frequency Min 2X/week   PT Duration 12 weeks   PT Treatment/Interventions DME instruction;Stair training;Gait training;Functional mobility training;Therapeutic exercise;Therapeutic activities;Manual techniques;Patient/family education;Neuromuscular re-education;Balance training;Other (comment)  prosthetic training   PT Plan Continue gait, ramp, stair training. Ambulate outdoors over even/uneven terrain.        Problem List Patient Active Problem List   Diagnosis Date Noted  . Abnormal cardiovascular stress test 12/29/2013  . Awaiting organ transplant 12/09/2013  . Leg ulcer 11/17/2013  . Chronic kidney disease (CKD), stage V 11/17/2013  . Diabetes 11/17/2013  . Cancer of duodenum 11/17/2013  . Dyslipidemia 11/17/2013  . Chronic kidney disease requiring chronic dialysis 11/17/2013  . Hypercholesteremia 11/17/2013  . H/O malignant neoplasm 11/17/2013  . H/O: HTN (hypertension) 11/17/2013  . BP (high blood pressure) 11/17/2013  . Hypotension 11/17/2013  . Angiopathy, peripheral 11/17/2013  . AF (paroxysmal atrial fibrillation) 11/17/2013  . BKA stump complication 03/00/9233  . S/P bilateral BKA (below knee amputation) 08/03/2013  . S/P BKA (below knee amputation) bilateral 07/30/2013  . Open wound of both legs with complication 00/76/2263  . Acute blood loss anemia 07/12/2013  . Syncope 07/09/2013  . Intolerance to CAPD peritoneal dialysis s/p CAPD cath removal 07/09/2013 07/05/2013  . Critical lower limb ischemia 06/30/2013  . Extremity pain 06/30/2013  . Atherosclerosis of native arteries of the extremities with ulceration(440.23) 06/17/2013  . Gait disorder 05/31/2013  . Weakness 05/24/2013  . Depression 04/03/2013  .  GERD (gastroesophageal reflux disease) 04/03/2013   . Atrial fibrillation 04/02/2013  . DM (diabetes mellitus) 12/28/2012  . Other complications due to renal dialysis device, implant, and graft 05/19/2012  . Diabetes mellitus, type 2 11/13/2011  . Anemia 07/02/2011  . ESRD on hemodialysis 05/29/2011                                            Millan Legan L 03/21/2014, 3:29 PM

## 2014-03-23 ENCOUNTER — Ambulatory Visit: Payer: Managed Care, Other (non HMO) | Admitting: Physical Therapy

## 2014-03-23 DIAGNOSIS — Z89511 Acquired absence of right leg below knee: Secondary | ICD-10-CM

## 2014-03-23 DIAGNOSIS — R269 Unspecified abnormalities of gait and mobility: Secondary | ICD-10-CM | POA: Diagnosis not present

## 2014-03-23 DIAGNOSIS — Z89512 Acquired absence of left leg below knee: Secondary | ICD-10-CM

## 2014-03-23 DIAGNOSIS — R5381 Other malaise: Secondary | ICD-10-CM

## 2014-03-23 NOTE — Therapy (Signed)
Physical Therapy Treatment  Patient Details  Name: Paul [name] MRN: [id number] Date of Birth: Oct 08, 1955  Encounter Date: 03/23/2014      PT End of Session - 03/23/14 1043    Visit Number 17   Number of Visits 40   Date for PT Re-Evaluation 06/17/14   PT Start Time 0930   PT Stop Time 1030   PT Time Calculation (min) 60 min   Equipment Utilized During Treatment Gait belt;Back brace   Activity Tolerance Patient tolerated treatment well;Patient limited by pain      Past Medical History  Diagnosis Date  . Open wound of both legs with complication     Pt has had progressive wounds of both LE's including gangrene of the toes and patchy necrosis of the calves.  He has been treated at [facility] by Dr Jerline Pain with hyperbaric O2 5d per week.  He is getting Na Thiosulfate with HD for suspected calciphylaxis. Pt says doctor's aren't sure if these ulcers were diabetic ulcers or calciphylaxis.  He underwent bilat BKA on 07/30/13.  He says that the woun  . ESRD on hemodialysis     Pt has ESRD due to DM.  He had a L upper arm AVF prior to starting HD which was ligated due to L arm steal syndrome.  He started HD in Jan 2014 and did home HD.  The family couldn't cannulate the R arm AVF successfully so by mid 2014 they decided to switch to PD which was done late summer 2014.  In Jan 2015 he was admitted with FTT felt to be due to underdialysis on PD and PD was abandoned and he   . Anemia     esrd  . Hypertension     off of meds due to orthostatic hypotension  . Closed left arm fracture 1967  . Heart murmur   . Corneal abrasion, left   . GERD (gastroesophageal reflux disease)   . History of blood transfusion   . Glomerulosclerosis, diabetic   . Depression   . A-fib   . Calciphylaxis 05/2013    lower ext  . Diabetes mellitus     Type 1 iddm x 11 yrs  . Diabetic neuropathy   . Cancer     hx duodenal adenoCa 2002, resected    Past Surgical History  Procedure Laterality Date  . Whipple  procedure  2002    duodenal ca, Dr. Harlow Asa  . Bone marrow biopsy  2012  . Renal biopsy, percutaneous  2012  . Tonsillectomy    . Portacath placement  2002  . Biliary diversion, external  2002  . Other surgical history      Cyst removed from back  . Av fistula placement  06/07/2011    Procedure: ARTERIOVENOUS (AV) FISTULA CREATION;  Surgeon: Angelia Mould, MD;  Location: [place];  Service: Vascular;  Laterality: Left;  . Ligation of arteriovenous  fistula  05/22/2012    Procedure: LIGATION OF ARTERIOVENOUS  FISTULA;  Surgeon: Mal Misty, MD;  Location: [place];  Service: Vascular;  Laterality: Left;  Left brachio-cephalic fistula  . Insertion of dialysis catheter  05/22/2012    Procedure: INSERTION OF DIALYSIS CATHETER;  Surgeon: Mal Misty, MD;  Location: [place];  Service: Vascular;  Laterality: N/A;  right internal jugular vein  . Av fistula placement  06/18/2012    Procedure: ARTERIOVENOUS (AV) FISTULA CREATION;  Surgeon: Mal Misty, MD;  Location: [place];  Service: Vascular;  Laterality: Right;  CIMINO  . Colonoscopy      Hx: of  . Capd insertion N/A 01/14/2013    Procedure: LAPAROSCOPIC INSERTION CONTINUOUS AMBULATORY PERITONEAL DIALYSIS  (CAPD) CATHETER;  Surgeon: Adin Hector, MD;  Location: [place];  Service: General;  Laterality: N/A;  . Vascular surgery    . Removal of a dialysis catheter    . Capd removal N/A 07/09/2013    Procedure: CONTINUOUS AMBULATORY PERITONEAL DIALYSIS  (CAPD) CATHETER REMOVAL;  Surgeon: Adin Hector, MD;  Location: [place];  Service: General;  Laterality: N/A;  . Amputation Bilateral 07/30/2013    Procedure: AMPUTATION BELOW KNEE;  Surgeon: Newt Minion, MD;  Location: [place];  Service: Orthopedics;  Laterality: Bilateral;  Bilateral Below Knee Amputations  . Amputation Bilateral 09/03/2013    Procedure: AMPUTATION BELOW KNEE;  Surgeon: Newt Minion, MD;  Location: [place];  Service: Orthopedics;  Laterality: Bilateral;  Bilateral Below Knee  Amputation Revision    There were no vitals taken for this visit.  Visit Diagnosis:  No diagnosis found.          Adult PT Treatment/Exercise - 03/23/14 0940    Transfers   Transfers Sit to Stand;Stand to Sit   Sit to Stand 4: Min guard;With armrests  Min guard for balance and safety.   Stand to Sit With upper extremity assist;To chair/3-in-1;4: Min assist;With armrests   Stand to Sit Details cues on crutch placement   Ambulation/Gait   Ambulation/Gait Yes   Ambulation/Gait Assistance 4: Min guard   Ambulation Distance (Feet) 230 Feet   Assistive device Lofstrands   Gait Pattern Step-through pattern;Decreased stride length;Trunk flexed  Increased L foot ER/medial whip.   Stairs Yes   Stairs Assistance 4: Min guard;4: Min assist  min A during one [place] Details (indicate cue type and reason) verbal cues on foot position & sequence   Stair Management Technique Forwards;With crutches;One rail Left  used 1 crutch in right UE & 1 rail in left UE   Number of Stairs 4   Height of Stairs --   Ramp 3: Mod assist;Other (comment)  forearm crutches then parallel bars to practice under contro   Ramp Details (indicate cue type and reason) weight shift first with upright trunk followed by knee flexion descending & knee extension ascending   Curb 3: Mod assist;Other (comment)  bilateral prostheses with forearm crutches   Curb Details (indicate cue type and reason) verbal cues on foot position & sequence   Ambulation   Stairs Assistance Details Tactile cues for sequencing;Verbal cues for sequencing  Min guard for safety, with Mod A during one LOB episode.          Education - 03/23/14 1041    Education provided Yes   Education Details drying limb /liner, signs of sweating in prostheses, using w/c to enter /exit [employer] until builds tolerance to new exercise program   Education Details Patient;Spouse   Methods Explanation   Comprehension Verbalized understanding               Plan - 03/23/14 1045    Clinical Impression Statement Patient was able to improve balance & technique on inclines with instruction & repetition in parallel bars. Tenderness noted on left limb probably related to slippage with sweating & not drying    Pt will benefit from skilled therapeutic intervention in order to improve on the following deficits Abnormal gait;Decreased activity tolerance;Decreased balance;Decreased strength;Difficulty walking;Decreased knowledge of use of DME;Other (comment)  prosthetic dependency   Rehab Potential Good   PT Frequency Min 2X/week   PT Duration Other (comment)  3 months   PT Treatment/Interventions DME instruction;Stair training;Gait training;Functional mobility training;Therapeutic exercise;Therapeutic activities;Manual techniques;Patient/family education;Neuromuscular re-education;Balance training;Other (comment)  prosthetic training   PT Plan continue prosthetic gait with forearm crutches        Problem List Patient Active Problem List   Diagnosis Date Noted  . Abnormal cardiovascular stress test 12/29/2013  . Awaiting organ transplant 12/09/2013  . Leg ulcer 11/17/2013  . Chronic kidney disease (CKD), stage V 11/17/2013  . Diabetes 11/17/2013  . Cancer of duodenum 11/17/2013  . Dyslipidemia 11/17/2013  . Chronic kidney disease requiring chronic dialysis 11/17/2013  . Hypercholesteremia 11/17/2013  . H/O malignant neoplasm 11/17/2013  . H/O: HTN (hypertension) 11/17/2013  . BP (high blood pressure) 11/17/2013  . Hypotension 11/17/2013  . Angiopathy, peripheral 11/17/2013  . AF (paroxysmal atrial fibrillation) 11/17/2013  . BKA stump complication 51/70/0174  . S/P bilateral BKA (below knee amputation) 08/03/2013  . S/P BKA (below knee amputation) bilateral 07/30/2013  . Open wound of both legs with complication 94/49/6759  . Acute blood loss anemia 07/12/2013  . Syncope 07/09/2013  . Intolerance to CAPD peritoneal  dialysis s/p CAPD cath removal 07/09/2013 07/05/2013  . Critical lower limb ischemia 06/30/2013  . Extremity pain 06/30/2013  . Atherosclerosis of native arteries of the extremities with ulceration(440.23) 06/17/2013  . Gait disorder 05/31/2013  . Weakness 05/24/2013  . Depression 04/03/2013  . GERD (gastroesophageal reflux disease) 04/03/2013  . Atrial fibrillation 04/02/2013  . DM (diabetes mellitus) 12/28/2012  . Other complications due to renal dialysis device, implant, and graft 05/19/2012  . Diabetes mellitus, type 2 11/13/2011  . Anemia 07/02/2011  . ESRD on hemodialysis 05/29/2011                                            Gil Ingwersen 03/23/2014, 10:56 AM

## 2014-03-29 ENCOUNTER — Encounter: Payer: Managed Care, Other (non HMO) | Admitting: Physical Therapy

## 2014-03-30 ENCOUNTER — Ambulatory Visit: Payer: Managed Care, Other (non HMO) | Admitting: Physical Therapy

## 2014-03-30 ENCOUNTER — Encounter: Payer: Self-pay | Admitting: Physical Therapy

## 2014-03-30 DIAGNOSIS — R269 Unspecified abnormalities of gait and mobility: Secondary | ICD-10-CM | POA: Diagnosis not present

## 2014-03-30 DIAGNOSIS — Z89512 Acquired absence of left leg below knee: Secondary | ICD-10-CM

## 2014-03-30 DIAGNOSIS — R5381 Other malaise: Secondary | ICD-10-CM

## 2014-03-30 DIAGNOSIS — Z89511 Acquired absence of right leg below knee: Secondary | ICD-10-CM

## 2014-03-30 NOTE — Therapy (Signed)
Physical Therapy Treatment  Patient Details  Name: Paul [name] MRN: [id number] Date of Birth: 1956-02-22  Encounter Date: 03/30/2014      PT End of Session - 03/30/14 1106    Visit Number 18   Number of Visits 40   Date for PT Re-Evaluation 06/17/14   PT Start Time 2355   PT Stop Time 1105   PT Time Calculation (min) 50 min   Equipment Utilized During Treatment Gait belt;Back brace   Activity Tolerance Patient tolerated treatment well   Behavior During Therapy [facility] for tasks assessed/performed      Past Medical History  Diagnosis Date  . Open wound of both legs with complication     Pt has had progressive wounds of both LE's including gangrene of the toes and patchy necrosis of the calves.  He has been treated at [facility] by Dr Jerline Pain with hyperbaric O2 5d per week.  He is getting Na Thiosulfate with HD for suspected calciphylaxis. Pt says doctor's aren't sure if these ulcers were diabetic ulcers or calciphylaxis.  He underwent bilat BKA on 07/30/13.  He says that the woun  . ESRD on hemodialysis     Pt has ESRD due to DM.  He had a L upper arm AVF prior to starting HD which was ligated due to L arm steal syndrome.  He started HD in Jan 2014 and did home HD.  The family couldn't cannulate the R arm AVF successfully so by mid 2014 they decided to switch to PD which was done late summer 2014.  In Jan 2015 he was admitted with FTT felt to be due to underdialysis on PD and PD was abandoned and he   . Anemia     esrd  . Hypertension     off of meds due to orthostatic hypotension  . Closed left arm fracture 1967  . Heart murmur   . Corneal abrasion, left   . GERD (gastroesophageal reflux disease)   . History of blood transfusion   . Glomerulosclerosis, diabetic   . Depression   . A-fib   . Calciphylaxis 05/2013    lower ext  . Diabetes mellitus     Type 1 iddm x 11 yrs  . Diabetic neuropathy   . Cancer     hx duodenal adenoCa 2002, resected    Past Surgical History   Procedure Laterality Date  . Whipple procedure  2002    duodenal ca, Dr. Harlow Asa  . Bone marrow biopsy  2012  . Renal biopsy, percutaneous  2012  . Tonsillectomy    . Portacath placement  2002  . Biliary diversion, external  2002  . Other surgical history      Cyst removed from back  . Av fistula placement  06/07/2011    Procedure: ARTERIOVENOUS (AV) FISTULA CREATION;  Surgeon: Angelia Mould, MD;  Location: [place];  Service: Vascular;  Laterality: Left;  . Ligation of arteriovenous  fistula  05/22/2012    Procedure: LIGATION OF ARTERIOVENOUS  FISTULA;  Surgeon: Mal Misty, MD;  Location: [place];  Service: Vascular;  Laterality: Left;  Left brachio-cephalic fistula  . Insertion of dialysis catheter  05/22/2012    Procedure: INSERTION OF DIALYSIS CATHETER;  Surgeon: Mal Misty, MD;  Location: [place];  Service: Vascular;  Laterality: N/A;  right internal jugular vein  . Av fistula placement  06/18/2012    Procedure: ARTERIOVENOUS (AV) FISTULA CREATION;  Surgeon: Mal Misty, MD;  Location: [place];  Service: Vascular;  Laterality: Right;  CIMINO  . Colonoscopy      Hx: of  . Capd insertion N/A 01/14/2013    Procedure: LAPAROSCOPIC INSERTION CONTINUOUS AMBULATORY PERITONEAL DIALYSIS  (CAPD) CATHETER;  Surgeon: Adin Hector, MD;  Location: [place];  Service: General;  Laterality: N/A;  . Vascular surgery    . Removal of a dialysis catheter    . Capd removal N/A 07/09/2013    Procedure: CONTINUOUS AMBULATORY PERITONEAL DIALYSIS  (CAPD) CATHETER REMOVAL;  Surgeon: Adin Hector, MD;  Location: [place];  Service: General;  Laterality: N/A;  . Amputation Bilateral 07/30/2013    Procedure: AMPUTATION BELOW KNEE;  Surgeon: Newt Minion, MD;  Location: [place];  Service: Orthopedics;  Laterality: Bilateral;  Bilateral Below Knee Amputations  . Amputation Bilateral 09/03/2013    Procedure: AMPUTATION BELOW KNEE;  Surgeon: Newt Minion, MD;  Location: [place];  Service: Orthopedics;  Laterality:  Bilateral;  Bilateral Below Knee Amputation Revision    There were no vitals taken for this visit.  Visit Diagnosis:  Abnormality of gait  Debility  S/P bilateral BKA (below knee amputation)          [facility] Adult PT Treatment/Exercise - 03/30/14 0001    Transfers   Sit to Stand 4: Min assist;With upper extremity assist;From chair/3-in-1;Other/comment  chair without armrests using UE to push up   Sit to Stand Details (indicate cue type and reason) PT demo/instructed in technique /wt shift   Stand to Sit 4: Min guard;With upper extremity assist;To chair/3-in-1   Stand to Sit Details PT demo/instructed in technique /wt shift   Ambulation/Gait   Ambulation/Gait Yes   Ambulation/Gait Assistance 4: Min assist   Ambulation Distance (Feet) 68 Feet  68 X1, 82 X 1   Assistive device Lofstrands   Gait Pattern Step-through pattern;Decreased stride length;Trunk flexed  4 pt pattern   Stairs Yes   Stairs Assistance 4: Min guard   Stairs Assistance Details (indicate cue type and reason) cues on sequence & wt shift   Stair Management Technique One rail Left;Forwards;Other (comment);Step to pattern  crutch right UE   Number of Stairs 4   Curb 4: Min assist;5: Supervision;Other (comment)  prostheses & RW   Curb Details (indicate cue type and reason) verbal /demo cues on foot position & wt shift   Knee/Hip Exercises: Standing   Forward Step Up Both;5 reps;Hand Hold: 2;Step Height: 4";Other (comment)  RW & prostheses, cues to alt. lead leg, use LE>UE   Step Down Both;5 reps;Hand Hold: 2;Step Height: 4";Other (comment)  RW & prostheses, cues to alt. lead leg, use LE>UE   Step Down Limitations LE weakness eccentric   Prosthetics   Current prosthetic wear tolerance (days/week)  7 days/wk   Current prosthetic wear tolerance (#hours/day)  >90% of awake hours          PT Education - 03/30/14 1139    Education provided Yes   Education Details use of back pack to carry items & design, HEP  of step-ups using RW & 4" block or aerobic step, sit to/from stand technique with practice at counter, length of long pants with bil. prostheses   Person(s) Educated Patient;Spouse   Methods Explanation   Comprehension Verbalized understanding          PT Short Term Goals - 03/30/14 1015    PT SHORT TERM GOAL #1   Title Pt will be able to: Ambulate 500 ft. with forearm crutches and prostheses with supervision. (  04/18/14)   Time 4   Period Weeks   Status On-going   PT SHORT TERM GOAL #2   Title Pt will be able to: Ambulate 100 ft. on uneven (grass) sufaces with forearm crutches and prostheses with contact assist. (04/18/14)   Time 4   Period Weeks   Status On-going   PT SHORT TERM GOAL #3   Title Pt will be at to:  Perform standing activities for >15 minutes with prostheses with back pain </= 2/10. (04/18/14)   Time 4   Period Weeks   Status On-going   PT SHORT TERM GOAL #4   Title Pt will be able to: reaches 5" & looks over shoulders with support of 1 crutch modified independent. (04/18/14)   Time 4   Period Weeks   Status On-going   PT SHORT TERM GOAL #5   Title Pt will be able to: negotiate ramp & curb with forearm crutches & prostheses with minimal assist. (04/18/14)   Time 4   Period Weeks   Status On-going          PT Long Term Goals - 03/30/14 1015    PT LONG TERM GOAL #1   Title Pt will be able to: Ambulate 535ft with forearm crutches & prostheses modified independent. (06/17/14)   Time 3   Period Months   Status On-going   PT LONG TERM GOAL #2   Title Pt will be able to: ambulate 200' on uneven (grass) surfaces with forearm crutches & prostheses modified independent.  (06/17/14)   Time 3   Period Months   Status On-going   PT LONG TERM GOAL #3   Title Pt will be able to: negotiate ramp, curb, stairs with forearm crutches & prostheses modified independent.  (06/17/14)   Time 3   Period Months   Status On-going   PT LONG TERM GOAL #4   Title Pt will be able  to: perform standing activities for > 20 minutes & prostheses with no pain or discomfort.  (06/17/14)   Time 3   Period Months   Status On-going   PT LONG TERM GOAL #5   Title Pt will be able to: sit to/from stand from chairs without armrest pushing with UEs, reaches 5" & looks over shoulders without UE support safely.  (06/17/14)   Time 3   Period Months   Status On-going   PT LONG TERM GOAL #6   Title Pt will be able to: self-report via FOTO improvement >10points in Functional Status Measure  (06/17/14)   Time 3   Period Months   Status On-going          Plan - 03/30/14 1142    Clinical Impression Statement Patient improved safety with negotiation of curb (single step into his den) with rolling walker with instruction & practice. Patient was able to improve use of LEs over UEs to control descent on curb.   Pt will benefit from skilled therapeutic intervention in order to improve on the following deficits Abnormal gait;Decreased activity tolerance;Decreased balance;Decreased knowledge of use of DME;Decreased mobility;Decreased strength;Other (comment)  prosthetic dependency   Rehab Potential Good   PT Frequency 2x / week   PT Treatment/Interventions ADLs/Self Care Home Management;DME Instruction;Gait training;Stair training;Functional mobility training;Therapeutic activities;Therapeutic exercise;Balance training;Neuromuscular re-education;Patient/family education;Other (comment)  prosthetic training   PT Next Visit Plan balance activities to decrease UE support & gait with forearm crutches (outside if weather permits)   Consulted and Agree with Plan of Care Patient;Family member/caregiver  PT Plan continue prosthetic gait with forearm crutches        Problem List Patient Active Problem List   Diagnosis Date Noted  . Abnormal cardiovascular stress test 12/29/2013  . Awaiting organ transplant 12/09/2013  . Leg ulcer 11/17/2013  . Chronic kidney disease (CKD), stage V 11/17/2013   . Diabetes 11/17/2013  . Cancer of duodenum 11/17/2013  . Dyslipidemia 11/17/2013  . Chronic kidney disease requiring chronic dialysis 11/17/2013  . Hypercholesteremia 11/17/2013  . H/O malignant neoplasm 11/17/2013  . H/O: HTN (hypertension) 11/17/2013  . BP (high blood pressure) 11/17/2013  . Hypotension 11/17/2013  . Angiopathy, peripheral 11/17/2013  . AF (paroxysmal atrial fibrillation) 11/17/2013  . BKA stump complication 36/14/4315  . S/P bilateral BKA (below knee amputation) 08/03/2013  . S/P BKA (below knee amputation) bilateral 07/30/2013  . Open wound of both legs with complication 40/12/6759  . Acute blood loss anemia 07/12/2013  . Syncope 07/09/2013  . Intolerance to CAPD peritoneal dialysis s/p CAPD cath removal 07/09/2013 07/05/2013  . Critical lower limb ischemia 06/30/2013  . Extremity pain 06/30/2013  . Atherosclerosis of native arteries of the extremities with ulceration(440.23) 06/17/2013  . Gait disorder 05/31/2013  . Weakness 05/24/2013  . Depression 04/03/2013  . GERD (gastroesophageal reflux disease) 04/03/2013  . Atrial fibrillation 04/02/2013  . DM (diabetes mellitus) 12/28/2012  . Other complications due to renal dialysis device, implant, and graft 05/19/2012  . Diabetes mellitus, type 2 11/13/2011  . Anemia 07/02/2011  . ESRD on hemodialysis 05/29/2011     This entire session of physical therapy was performed under the direct supervision of PT signing evaluation /treatment.  Blima Rich, Student PT  Domanick Cuccia PT 03/30/2014, 12:01 PM

## 2014-04-01 ENCOUNTER — Encounter: Payer: Self-pay | Admitting: Physical Therapy

## 2014-04-01 ENCOUNTER — Ambulatory Visit: Payer: Managed Care, Other (non HMO) | Admitting: Physical Therapy

## 2014-04-01 DIAGNOSIS — R269 Unspecified abnormalities of gait and mobility: Secondary | ICD-10-CM | POA: Diagnosis not present

## 2014-04-01 DIAGNOSIS — Z89512 Acquired absence of left leg below knee: Secondary | ICD-10-CM

## 2014-04-01 DIAGNOSIS — Z89511 Acquired absence of right leg below knee: Secondary | ICD-10-CM

## 2014-04-01 DIAGNOSIS — R5381 Other malaise: Secondary | ICD-10-CM

## 2014-04-01 NOTE — Therapy (Signed)
Physical Therapy Treatment  Patient Details  Name: Paul [name] MRN: [id number] Date of Birth: 1955-07-29  Encounter Date: 04/01/2014      PT End of Session - 04/01/14 1012    Visit Number 19   Number of Visits 40   Date for PT Re-Evaluation 06/17/14   PT Start Time 0802   PT Stop Time 0847   PT Time Calculation (min) 45 min   Equipment Utilized During Treatment Gait belt   Activity Tolerance Patient tolerated treatment well   Behavior During Therapy [facility] for tasks assessed/performed      Past Medical History  Diagnosis Date  . Open wound of both legs with complication     Pt has had progressive wounds of both LE's including gangrene of the toes and patchy necrosis of the calves.  He has been treated at [facility] by Dr Jerline Pain with hyperbaric O2 5d per week.  He is getting Na Thiosulfate with HD for suspected calciphylaxis. Pt says doctor's aren't sure if these ulcers were diabetic ulcers or calciphylaxis.  He underwent bilat BKA on 07/30/13.  He says that the woun  . ESRD on hemodialysis     Pt has ESRD due to DM.  He had a L upper arm AVF prior to starting HD which was ligated due to L arm steal syndrome.  He started HD in Jan 2014 and did home HD.  The family couldn't cannulate the R arm AVF successfully so by mid 2014 they decided to switch to PD which was done late summer 2014.  In Jan 2015 he was admitted with FTT felt to be due to underdialysis on PD and PD was abandoned and he   . Anemia     esrd  . Hypertension     off of meds due to orthostatic hypotension  . Closed left arm fracture 1967  . Heart murmur   . Corneal abrasion, left   . GERD (gastroesophageal reflux disease)   . History of blood transfusion   . Glomerulosclerosis, diabetic   . Depression   . A-fib   . Calciphylaxis 05/2013    lower ext  . Diabetes mellitus     Type 1 iddm x 11 yrs  . Diabetic neuropathy   . Cancer     hx duodenal adenoCa 2002, resected    Past Surgical History  Procedure  Laterality Date  . Whipple procedure  2002    duodenal ca, Dr. Harlow Asa  . Bone marrow biopsy  2012  . Renal biopsy, percutaneous  2012  . Tonsillectomy    . Portacath placement  2002  . Biliary diversion, external  2002  . Other surgical history      Cyst removed from back  . Av fistula placement  06/07/2011    Procedure: ARTERIOVENOUS (AV) FISTULA CREATION;  Surgeon: Angelia Mould, MD;  Location: [place];  Service: Vascular;  Laterality: Left;  . Ligation of arteriovenous  fistula  05/22/2012    Procedure: LIGATION OF ARTERIOVENOUS  FISTULA;  Surgeon: Mal Misty, MD;  Location: [place];  Service: Vascular;  Laterality: Left;  Left brachio-cephalic fistula  . Insertion of dialysis catheter  05/22/2012    Procedure: INSERTION OF DIALYSIS CATHETER;  Surgeon: Mal Misty, MD;  Location: [place];  Service: Vascular;  Laterality: N/A;  right internal jugular vein  . Av fistula placement  06/18/2012    Procedure: ARTERIOVENOUS (AV) FISTULA CREATION;  Surgeon: Mal Misty, MD;  Location: [place];  Service: Vascular;  Laterality: Right;  CIMINO  . Colonoscopy      Hx: of  . Capd insertion N/A 01/14/2013    Procedure: LAPAROSCOPIC INSERTION CONTINUOUS AMBULATORY PERITONEAL DIALYSIS  (CAPD) CATHETER;  Surgeon: Adin Hector, MD;  Location: [place];  Service: General;  Laterality: N/A;  . Vascular surgery    . Removal of a dialysis catheter    . Capd removal N/A 07/09/2013    Procedure: CONTINUOUS AMBULATORY PERITONEAL DIALYSIS  (CAPD) CATHETER REMOVAL;  Surgeon: Adin Hector, MD;  Location: [place];  Service: General;  Laterality: N/A;  . Amputation Bilateral 07/30/2013    Procedure: AMPUTATION BELOW KNEE;  Surgeon: Newt Minion, MD;  Location: [place];  Service: Orthopedics;  Laterality: Bilateral;  Bilateral Below Knee Amputations  . Amputation Bilateral 09/03/2013    Procedure: AMPUTATION BELOW KNEE;  Surgeon: Newt Minion, MD;  Location: [place];  Service: Orthopedics;  Laterality: Bilateral;   Bilateral Below Knee Amputation Revision    There were no vitals taken for this visit.  Visit Diagnosis:  Abnormality of gait  S/P bilateral BKA (below knee amputation)  Debility      Subjective Assessment - 04/01/14 0807    Symptoms Patient reports x-ray of lumbar spine shows fracture is stable, still wearing back brace. Performs step in house with RW comfortably with no loss of balance.    Currently in Pain? No/denies            [facility] Adult PT Treatment/Exercise - 04/01/14 0830    Transfers   Transfers Sit to Stand;Stand to Sit   Sit to Stand 4: Min guard;With upper extremity assist;Other/comment  practiced with no UE support x10 with varying height   Sit to Stand Details (indicate cue type and reason) verbal cues for sequencing   Stand to Sit 5: Supervision;With upper extremity assist;Other (comment)  mat table   Stand to Sit Details supervision with cues on posture and weight shifting   Ambulation/Gait   Ambulation/Gait Yes   Ambulation/Gait Assistance 4: Min guard   Ambulation Distance (Feet) 115 Feet   Assistive device Lofstrands   Gait Pattern Step-through pattern;Wide base of support;Trunk flexed;Decreased stride length  decreased cadence   Stairs Yes   Ramp 3: Mod assist   Ramp Details (indicate cue type and reason) verbal cues for weight shifting, step length and crutch placement   Curb 4: Min assist;Other (comment)  pt ascended aerobic step (3") then curb (additional 3")   Curb Details (indicate cue type and reason) verbal cues on foot position, step/crutch sequence   Prosthetics   Current prosthetic wear tolerance (days/week)  7 days/wk   Current prosthetic wear tolerance (#hours/day)  >90% of awake hours          PT Education - 04/01/14 1012    Education provided Yes   Education Details Balance exercise (static standing) in front of stable surface with walker in front to work on balance with no UE support.    Person(s) Educated Patient   Methods  Explanation;Demonstration   Comprehension Verbalized understanding              Plan - 04/01/14 1013    Clinical Impression Statement Patients has improved safety of sit to stand due to increase in strength and posture/weight shifting with no UE support. Pt is limited in ramp/curb ascent by bilateral quad weakness and lack of confidence in performance.    Pt will benefit from skilled therapeutic intervention in order to improve on  the following deficits Abnormal gait;Decreased activity tolerance;Decreased balance;Decreased knowledge of use of DME;Decreased mobility;Decreased strength;Other (comment);Decreased endurance;Difficulty walking   Rehab Potential Good   PT Frequency 2x / week   PT Treatment/Interventions ADLs/Self Care Home Management;DME Instruction;Gait training;Stair training;Functional mobility training;Therapeutic activities;Therapeutic exercise;Balance training;Neuromuscular re-education;Patient/family education;Other (comment)   PT Next Visit Plan NEW G CODE. Ascend/descend ramp/curb with Lofstrand crutches (aerobic step to decrease height of curb). reaching with static standing and no UE support.   Consulted and Agree with Plan of Care Patient;Family member/caregiver   PT Plan continue prosthetic gait with forearm crutches        Problem List Patient Active Problem List   Diagnosis Date Noted  . Abnormal cardiovascular stress test 12/29/2013  . Awaiting organ transplant 12/09/2013  . Leg ulcer 11/17/2013  . Chronic kidney disease (CKD), stage V 11/17/2013  . Diabetes 11/17/2013  . Cancer of duodenum 11/17/2013  . Dyslipidemia 11/17/2013  . Chronic kidney disease requiring chronic dialysis 11/17/2013  . Hypercholesteremia 11/17/2013  . H/O malignant neoplasm 11/17/2013  . H/O: HTN (hypertension) 11/17/2013  . BP (high blood pressure) 11/17/2013  . Hypotension 11/17/2013  . Angiopathy, peripheral 11/17/2013  . AF (paroxysmal atrial fibrillation) 11/17/2013  .  BKA stump complication 09/81/1914  . S/P bilateral BKA (below knee amputation) 08/03/2013  . S/P BKA (below knee amputation) bilateral 07/30/2013  . Open wound of both legs with complication 78/29/5621  . Acute blood loss anemia 07/12/2013  . Syncope 07/09/2013  . Intolerance to CAPD peritoneal dialysis s/p CAPD cath removal 07/09/2013 07/05/2013  . Critical lower limb ischemia 06/30/2013  . Extremity pain 06/30/2013  . Atherosclerosis of native arteries of the extremities with ulceration(440.23) 06/17/2013  . Gait disorder 05/31/2013  . Weakness 05/24/2013  . Depression 04/03/2013  . GERD (gastroesophageal reflux disease) 04/03/2013  . Atrial fibrillation 04/02/2013  . DM (diabetes mellitus) 12/28/2012  . Other complications due to renal dialysis device, implant, and graft 05/19/2012  . Diabetes mellitus, type 2 11/13/2011  . Anemia 07/02/2011  . ESRD on hemodialysis 05/29/2011                                              Blima Rich, Student PT 04/01/2014, 11:38 AM

## 2014-04-04 ENCOUNTER — Encounter: Payer: Self-pay | Admitting: Physical Therapy

## 2014-04-04 ENCOUNTER — Ambulatory Visit: Payer: Managed Care, Other (non HMO) | Admitting: Physical Therapy

## 2014-04-04 DIAGNOSIS — Z89512 Acquired absence of left leg below knee: Secondary | ICD-10-CM

## 2014-04-04 DIAGNOSIS — R269 Unspecified abnormalities of gait and mobility: Secondary | ICD-10-CM | POA: Diagnosis not present

## 2014-04-04 DIAGNOSIS — Z89511 Acquired absence of right leg below knee: Secondary | ICD-10-CM

## 2014-04-04 NOTE — Therapy (Addendum)
Physical Therapy Treatment  Patient Details  Name: Paul [name] MRN: [id number] Date of Birth: 07-02-1955  Encounter Date: 04/04/2014      PT End of Session - 04/04/14 1133    Visit Number 20   Number of Visits 40   Date for PT Re-Evaluation 06/17/14   PT Start Time 1016   PT Stop Time 1102   PT Time Calculation (min) 46 min   Equipment Utilized During Treatment Gait belt   Activity Tolerance Patient tolerated treatment well   Behavior During Therapy [facility] for tasks assessed/performed      Past Medical History  Diagnosis Date  . Open wound of both legs with complication     Pt has had progressive wounds of both LE's including gangrene of the toes and patchy necrosis of the calves.  He has been treated at [facility] by Dr Jerline Pain with hyperbaric O2 5d per week.  He is getting Na Thiosulfate with HD for suspected calciphylaxis. Pt says doctor's aren't sure if these ulcers were diabetic ulcers or calciphylaxis.  He underwent bilat BKA on 07/30/13.  He says that the woun  . ESRD on hemodialysis     Pt has ESRD due to DM.  He had a L upper arm AVF prior to starting HD which was ligated due to L arm steal syndrome.  He started HD in Jan 2014 and did home HD.  The family couldn't cannulate the R arm AVF successfully so by mid 2014 they decided to switch to PD which was done late summer 2014.  In Jan 2015 he was admitted with FTT felt to be due to underdialysis on PD and PD was abandoned and he   . Anemia     esrd  . Hypertension     off of meds due to orthostatic hypotension  . Closed left arm fracture 1967  . Heart murmur   . Corneal abrasion, left   . GERD (gastroesophageal reflux disease)   . History of blood transfusion   . Glomerulosclerosis, diabetic   . Depression   . A-fib   . Calciphylaxis 05/2013    lower ext  . Diabetes mellitus     Type 1 iddm x 11 yrs  . Diabetic neuropathy   . Cancer     hx duodenal adenoCa 2002, resected    Past Surgical History  Procedure  Laterality Date  . Whipple procedure  2002    duodenal ca, Dr. Harlow Asa  . Bone marrow biopsy  2012  . Renal biopsy, percutaneous  2012  . Tonsillectomy    . Portacath placement  2002  . Biliary diversion, external  2002  . Other surgical history      Cyst removed from back  . Av fistula placement  06/07/2011    Procedure: ARTERIOVENOUS (AV) FISTULA CREATION;  Surgeon: Angelia Mould, MD;  Location: [place];  Service: Vascular;  Laterality: Left;  . Ligation of arteriovenous  fistula  05/22/2012    Procedure: LIGATION OF ARTERIOVENOUS  FISTULA;  Surgeon: Mal Misty, MD;  Location: [place];  Service: Vascular;  Laterality: Left;  Left brachio-cephalic fistula  . Insertion of dialysis catheter  05/22/2012    Procedure: INSERTION OF DIALYSIS CATHETER;  Surgeon: Mal Misty, MD;  Location: [place];  Service: Vascular;  Laterality: N/A;  right internal jugular vein  . Av fistula placement  06/18/2012    Procedure: ARTERIOVENOUS (AV) FISTULA CREATION;  Surgeon: Mal Misty, MD;  Location: [place];  Service: Vascular;  Laterality: Right;  CIMINO  . Colonoscopy      Hx: of  . Capd insertion N/A 01/14/2013    Procedure: LAPAROSCOPIC INSERTION CONTINUOUS AMBULATORY PERITONEAL DIALYSIS  (CAPD) CATHETER;  Surgeon: Adin Hector, MD;  Location: [place];  Service: General;  Laterality: N/A;  . Vascular surgery    . Removal of a dialysis catheter    . Capd removal N/A 07/09/2013    Procedure: CONTINUOUS AMBULATORY PERITONEAL DIALYSIS  (CAPD) CATHETER REMOVAL;  Surgeon: Adin Hector, MD;  Location: [place];  Service: General;  Laterality: N/A;  . Amputation Bilateral 07/30/2013    Procedure: AMPUTATION BELOW KNEE;  Surgeon: Newt Minion, MD;  Location: [place];  Service: Orthopedics;  Laterality: Bilateral;  Bilateral Below Knee Amputations  . Amputation Bilateral 09/03/2013    Procedure: AMPUTATION BELOW KNEE;  Surgeon: Newt Minion, MD;  Location: [place];  Service: Orthopedics;  Laterality: Bilateral;   Bilateral Below Knee Amputation Revision    There were no vitals taken for this visit.  Visit Diagnosis:  Abnormality of gait  S/P bilateral BKA (below knee amputation)      Subjective Assessment - 04/04/14 1021    Symptoms Practicing sit<>stand at home working on balance in standing with RW in front for safety.    Currently in Pain? No/denies            [facility] Adult PT Treatment/Exercise - 04/04/14 1030    Transfers   Transfers Sit to Stand;Stand to Sit   Sit to Stand 4: Min guard;From chair/3-in-1;With upper extremity assist  without armrests   Sit to Stand Details (indicate cue type and reason) verbal cues for weight shifting   x10 practicing with no armrests, no UE support in standing   Stand to Sit 5: Supervision   Stand to Sit Details verbal cues for hip/knee control during descent   Ambulation/Gait   Ambulation/Gait Yes   Ambulation/Gait Assistance 5: Supervision   Ambulation Distance (Feet) 150 Feet  outside on even terrain (18 on grass), 180 X 1 on indoor surfaces with weight of backpack (enables to carry items in community)   Assistive device Lofstrands, Bilateral prostheses   Gait Pattern Step-through pattern;Wide base of support;Trunk flexed;Decreased stride length  decreased cadence   Stairs Yes   Door Management 4: Min assist  Fire door (increased weight/resistance) with forearm crutches & bilateral prostheses   Door Managment Details (indicate cue type and reason) received minimal cues for sequencing   Ramp 4: Min assist   Ramp Details (indicate cue type and reason) outdoor ramp; did not require cues for crutch placement   Curb 4: Min assist;Other (comment)   Curb Details (indicate cue type and reason) outdoor curb; min asst. for balance   Balance   Balance Assessed Yes   Static Standing Balance   Static Standing - Balance Support No upper extremity supported   Static Standing - Level of Assistance 5: Stand by assistance   Static Standing - Comment/# of  Minutes 15 seconds  x3   Dynamic Standing Balance   Dynamic Standing - Balance Support Bilateral upper extremity supported  hand held assist overhead, doffed/donned jacket & backpack in standing with light touch on walker with supervision.   Dynamic Standing - Level of Assistance 4: Min assist   Dynamic Standing - Balance Activities Lateral lean/weight shifting;Eyes open  taking small steps forward/backward   Prosthetics   Current prosthetic wear tolerance (days/week)  7 days/wk   Current prosthetic wear  tolerance (#hours/day)  >90% of awake hours          PT Education - 04/04/14 1132    Education provided Yes   Education Details Balance exercise at home (+) mini squat with BUE support on walker, chair behind for safety, set-up on weight machines at [facility] with foot support & posture   Person(s) Educated Patient, wife   Methods Explanation;Demonstration   Comprehension Verbalized understanding;Need further instruction              Plan - 04/04/14 1134    Clinical Impression Statement Patient demonstrated increased independence with ramp/curb navigation outside (possibly due to decreased incline and curb height). Quadriceps strength continues to be factor with greatest impact with sit to/from stand and curbs.   Pt will benefit from skilled therapeutic intervention in order to improve on the following deficits Abnormal gait;Decreased activity tolerance;Decreased balance;Decreased knowledge of use of DME;Decreased mobility;Decreased strength;Other (comment);Decreased endurance;Difficulty walking, prosthetic dependency   Rehab Potential Good   PT Frequency 2x / week   PT Duration    PT Treatment/Interventions ADLs/Self Care Home Management;DME Instruction;Gait training;Stair training;Functional mobility training;Therapeutic activities;Therapeutic exercise;Balance training;Neuromuscular re-education;Patient/family education;Other (comment), prosthetic training   PT Next Visit Plan  Review home exercise (balance/mini squats) compliance. If weather permitting, ramp/curb outside (supervision) and amulation on uneven surfaces. Increase difficulty by having him complete higher ramp/curb inside with decreased verbal cueing.    PT Home Exercise Plan Sit<>stand; mini squats with no UE support (RW in front and chair behind for safety).   Consulted and Agree with Plan of Care Patient        Problem List Patient Active Problem List   Diagnosis Date Noted  . Abnormal cardiovascular stress test 12/29/2013  . Awaiting organ transplant 12/09/2013  . Leg ulcer 11/17/2013  . Chronic kidney disease (CKD), stage V 11/17/2013  . Diabetes 11/17/2013  . Cancer of duodenum 11/17/2013  . Dyslipidemia 11/17/2013  . Chronic kidney disease requiring chronic dialysis 11/17/2013  . Hypercholesteremia 11/17/2013  . H/O malignant neoplasm 11/17/2013  . H/O: HTN (hypertension) 11/17/2013  . BP (high blood pressure) 11/17/2013  . Hypotension 11/17/2013  . Angiopathy, peripheral 11/17/2013  . AF (paroxysmal atrial fibrillation) 11/17/2013  . BKA stump complication 18/29/9371  . S/P bilateral BKA (below knee amputation) 08/03/2013  . S/P BKA (below knee amputation) bilateral 07/30/2013  . Open wound of both legs with complication 69/67/8938  . Acute blood loss anemia 07/12/2013  . Syncope 07/09/2013  . Intolerance to CAPD peritoneal dialysis s/p CAPD cath removal 07/09/2013 07/05/2013  . Critical lower limb ischemia 06/30/2013  . Extremity pain 06/30/2013  . Atherosclerosis of native arteries of the extremities with ulceration(440.23) 06/17/2013  . Gait disorder 05/31/2013  . Weakness 05/24/2013  . Depression 04/03/2013  . GERD (gastroesophageal reflux disease) 04/03/2013  . Atrial fibrillation 04/02/2013  . DM (diabetes mellitus) 12/28/2012  . Other complications due to renal dialysis device, implant, and graft 05/19/2012  . Diabetes mellitus, type 2 11/13/2011  . Anemia 07/02/2011   . ESRD on hemodialysis 05/29/2011                                              Blima Rich, Student PT 04/04/2014, 11:46 AM     This entire session of physical therapy was performed under the direct supervision of PT signing evaluation /treatment.

## 2014-04-06 ENCOUNTER — Ambulatory Visit: Payer: Managed Care, Other (non HMO) | Admitting: Physical Therapy

## 2014-04-06 ENCOUNTER — Encounter: Payer: Self-pay | Admitting: Physical Therapy

## 2014-04-06 DIAGNOSIS — R269 Unspecified abnormalities of gait and mobility: Secondary | ICD-10-CM

## 2014-04-06 DIAGNOSIS — Z89511 Acquired absence of right leg below knee: Secondary | ICD-10-CM

## 2014-04-06 DIAGNOSIS — Z89512 Acquired absence of left leg below knee: Secondary | ICD-10-CM

## 2014-04-06 NOTE — Therapy (Signed)
Physical Therapy Treatment  Patient Details  Name: Paul [name] MRN: [id number] Date of Birth: 27-Oct-1955  Encounter Date: 04/06/2014      PT End of Session - 04/06/14 1336    Visit Number 21   Number of Visits 40   Date for PT Re-Evaluation 06/17/14   PT Start Time 1017   PT Stop Time 1103   PT Time Calculation (min) 46 min   Equipment Utilized During Treatment Gait belt   Activity Tolerance Patient tolerated treatment well   Behavior During Therapy [facility] for tasks assessed/performed      Past Medical History  Diagnosis Date  . Open wound of both legs with complication     Pt has had progressive wounds of both LE's including gangrene of the toes and patchy necrosis of the calves.  He has been treated at [facility] by Dr Jerline Pain with hyperbaric O2 5d per week.  He is getting Na Thiosulfate with HD for suspected calciphylaxis. Pt says doctor's aren't sure if these ulcers were diabetic ulcers or calciphylaxis.  He underwent bilat BKA on 07/30/13.  He says that the woun  . ESRD on hemodialysis     Pt has ESRD due to DM.  He had a L upper arm AVF prior to starting HD which was ligated due to L arm steal syndrome.  He started HD in Jan 2014 and did home HD.  The family couldn't cannulate the R arm AVF successfully so by mid 2014 they decided to switch to PD which was done late summer 2014.  In Jan 2015 he was admitted with FTT felt to be due to underdialysis on PD and PD was abandoned and he   . Anemia     esrd  . Hypertension     off of meds due to orthostatic hypotension  . Closed left arm fracture 1967  . Heart murmur   . Corneal abrasion, left   . GERD (gastroesophageal reflux disease)   . History of blood transfusion   . Glomerulosclerosis, diabetic   . Depression   . A-fib   . Calciphylaxis 05/2013    lower ext  . Diabetes mellitus     Type 1 iddm x 11 yrs  . Diabetic neuropathy   . Cancer     hx duodenal adenoCa 2002, resected    Past Surgical History  Procedure  Laterality Date  . Whipple procedure  2002    duodenal ca, Dr. Harlow Asa  . Bone marrow biopsy  2012  . Renal biopsy, percutaneous  2012  . Tonsillectomy    . Portacath placement  2002  . Biliary diversion, external  2002  . Other surgical history      Cyst removed from back  . Av fistula placement  06/07/2011    Procedure: ARTERIOVENOUS (AV) FISTULA CREATION;  Surgeon: Angelia Mould, MD;  Location: [place];  Service: Vascular;  Laterality: Left;  . Ligation of arteriovenous  fistula  05/22/2012    Procedure: LIGATION OF ARTERIOVENOUS  FISTULA;  Surgeon: Mal Misty, MD;  Location: [place];  Service: Vascular;  Laterality: Left;  Left brachio-cephalic fistula  . Insertion of dialysis catheter  05/22/2012    Procedure: INSERTION OF DIALYSIS CATHETER;  Surgeon: Mal Misty, MD;  Location: [place];  Service: Vascular;  Laterality: N/A;  right internal jugular vein  . Av fistula placement  06/18/2012    Procedure: ARTERIOVENOUS (AV) FISTULA CREATION;  Surgeon: Mal Misty, MD;  Location: [place];  Service: Vascular;  Laterality: Right;  CIMINO  . Colonoscopy      Hx: of  . Capd insertion N/A 01/14/2013    Procedure: LAPAROSCOPIC INSERTION CONTINUOUS AMBULATORY PERITONEAL DIALYSIS  (CAPD) CATHETER;  Surgeon: Adin Hector, MD;  Location: [place];  Service: General;  Laterality: N/A;  . Vascular surgery    . Removal of a dialysis catheter    . Capd removal N/A 07/09/2013    Procedure: CONTINUOUS AMBULATORY PERITONEAL DIALYSIS  (CAPD) CATHETER REMOVAL;  Surgeon: Adin Hector, MD;  Location: [place];  Service: General;  Laterality: N/A;  . Amputation Bilateral 07/30/2013    Procedure: AMPUTATION BELOW KNEE;  Surgeon: Newt Minion, MD;  Location: [place];  Service: Orthopedics;  Laterality: Bilateral;  Bilateral Below Knee Amputations  . Amputation Bilateral 09/03/2013    Procedure: AMPUTATION BELOW KNEE;  Surgeon: Newt Minion, MD;  Location: [place];  Service: Orthopedics;  Laterality: Bilateral;   Bilateral Below Knee Amputation Revision    There were no vitals taken for this visit.  Visit Diagnosis:  Abnormality of gait  S/P bilateral BKA (below knee amputation)      Subjective Assessment - 04/06/14 1023    Symptoms  Limited activity yesterday due to not feeling well from dialysis.   Currently in Pain? denies            [facility] Adult PT Treatment/Exercise - 04/06/14 1030    Transfers   Transfers Sit to Stand;Stand to Sit   Sit to Stand 4: Min guard;With upper extremity assist;From chair/3-in-1  no arm rests   Sit to Stand Details (indicate cue type and reason) verbal cues for sequencing  practiced on 3 different surfaces with no arm rests   Stand to Sit 5: Supervision   Stand to Sit Details verbal cues to increase hip/knee flexion   Ambulation/Gait   Ambulation/Gait Yes   Ambulation/Gait Assistance 5: Supervision   Ambulation/Gait Assistance Details ambulated with navigation through 2 doors and up ramp 100' prior to rest. Ambulated on indoor and outdoor (grass, gravel, mulch)   Ambulation Distance (Feet) 310 Feet  Including brief sit/stand on picnic table bench, 30' on grass, 10' on mulch & 10' on gravel   Assistive device Lofstrands  bilateral below knee prostheses   Gait Pattern Step-through pattern;Wide base of support;Trunk flexed;Decreased stride length   Gait velocity decreased   Door Management 5: Supervision  to min guard in case of balance loss   Door Managment Details (indicate cue type and reason) no cues required for sequencing   Ramp 5: Supervision  min guard on indoor ramp, supervision on outdoor ramp   Ramp Details (indicate cue type and reason) verbal cues for crutch placement on indoor ramp  performed w/ lofstrand crutches and (B) prostheses   Curb 4: Min assist  min guard with outdoor (5") curb; min asst with indoor (7") curb   Curb Details (indicate cue type and reason) verbal cues on lofstrand crutch and stepping sequencing. Pt has increased  hesitation, increased time, and decreased independence with (7") curb navigation.   performed w/ lofstrand crutches and (B) prostheses          PT Education - 04/06/14 1334    Education provided Yes   Education Details Putting on and taking off jacket with use of 1 forearm crutch, weighing prostheses /wearing at dialysis -dry wt, ambulating on uneven terrain (beach), progressing weight of back pack to include computer   Person(s) Educated Patient;Spouse   Methods Explanation;Demonstration  Comprehension Verbalized understanding          PT Short Term Goals - 04/06/14 1030    PT SHORT TERM GOAL #1   Title Pt will be able to: Ambulate 500 ft. with forearm crutches and prostheses with supervision. (04/18/14)   Time 4   Period Weeks   Status On-going   PT SHORT TERM GOAL #2   Title Pt will be able to: Ambulate 100 ft. on uneven (grass) sufaces with forearm crutches and prostheses with contact assist. (04/18/14)   Time 4   Period Weeks   Status On-going   PT SHORT TERM GOAL #3   Title Pt will be at to:  Perform standing activities for >15 minutes with prostheses with back pain </= 2/10. (04/18/14)   Time 4   Period Weeks   Status On-going   PT SHORT TERM GOAL #4   Title Pt will be able to: reaches 5" & looks over shoulders with support of 1 crutch modified independent. (04/18/14)   Time 4   Period Weeks   Status On-going   PT SHORT TERM GOAL #5   Title Pt will be able to: negotiate ramp & curb with forearm crutches & prostheses with minimal assist. (04/18/14)   Time 4   Period Weeks   Status On-going          PT Long Term Goals - 04/06/14 1030    PT LONG TERM GOAL #1   Title Pt will be able to: Ambulate 541ft with forearm crutches & prostheses modified independent. (06/17/14)   Time 3   Period Months   Status On-going   PT LONG TERM GOAL #2   Title Pt will be able to: ambulate 200' on uneven (grass) surfaces with forearm crutches & prostheses modified independent.   (06/17/14)   Time 3   Period Months   Status On-going   PT LONG TERM GOAL #3   Title Pt will be able to: negotiate ramp, curb, stairs with forearm crutches & prostheses modified independent.  (06/17/14)   Time 3   Period Months   Status On-going   PT LONG TERM GOAL #4   Title Pt will be able to: perform standing activities for > 20 minutes & prostheses with no pain or discomfort.  (06/17/14)   Time 3   Period Months   Status On-going   PT LONG TERM GOAL #5   Title Pt will be able to: sit to/from stand from chairs without armrest pushing with UEs, reaches 5" & looks over shoulders without UE support safely.  (06/17/14)   Time 3   Period Months   Status On-going   PT LONG TERM GOAL #6   Title Pt will be able to: self-report via FOTO improvement >10points in Functional Status Measure  (06/17/14)   Time 3   Period Months   Status On-going          Plan - 04/06/14 1336    Clinical Impression Statement Patient demonstrates increased stability walking on non-compliant surfaces with lofstrand crutches, but ambulates with decreased gait speed for safety. Pt still having difficulty with higher curb (7") requiring increased time and assistance to complete compared to 5" curb. However this is improving with PT. Pt has difficulty with curb due to decreased quad strength and decreased UE support stability when using lofstrand crutches.     Pt will benefit from skilled therapeutic intervention in order to improve on the following deficits Abnormal gait;Decreased activity tolerance;Decreased balance;Decreased knowledge of use  of DME;Decreased mobility;Decreased strength;Decreased endurance;Difficulty walking, prosthetic dependency   Rehab Potential Good   PT Frequency 2x / week   PT Duration 8 weeks   PT Treatment/Interventions ADLs/Self Care Home Management;DME Instruction;Gait training;Stair training;Functional mobility training;Therapeutic activities;Therapeutic exercise;Balance  training;Neuromuscular re-education;Patient/family education;Other (comment)  prosthetic training   PT Next Visit Plan Review home exercise compliance (balance/mini-squats), ambulation on non-compliant surfaces, quad strengthening with decreased UE support.    Consulted and Agree with Plan of Care Patient   PT Plan Continue outdoor ambulation and curb training with forearm crutches. Patient is on vacation next week so taking week off from PT.        Problem List Patient Active Problem List   Diagnosis Date Noted  . Abnormal cardiovascular stress test 12/29/2013  . Awaiting organ transplant 12/09/2013  . Leg ulcer 11/17/2013  . Chronic kidney disease (CKD), stage V 11/17/2013  . Diabetes 11/17/2013  . Cancer of duodenum 11/17/2013  . Dyslipidemia 11/17/2013  . Chronic kidney disease requiring chronic dialysis 11/17/2013  . Hypercholesteremia 11/17/2013  . H/O malignant neoplasm 11/17/2013  . H/O: HTN (hypertension) 11/17/2013  . BP (high blood pressure) 11/17/2013  . Hypotension 11/17/2013  . Angiopathy, peripheral 11/17/2013  . AF (paroxysmal atrial fibrillation) 11/17/2013  . BKA stump complication 08/81/1031  . S/P bilateral BKA (below knee amputation) 08/03/2013  . S/P BKA (below knee amputation) bilateral 07/30/2013  . Open wound of both legs with complication 59/45/8592  . Acute blood loss anemia 07/12/2013  . Syncope 07/09/2013  . Intolerance to CAPD peritoneal dialysis s/p CAPD cath removal 07/09/2013 07/05/2013  . Critical lower limb ischemia 06/30/2013  . Extremity pain 06/30/2013  . Atherosclerosis of native arteries of the extremities with ulceration(440.23) 06/17/2013  . Gait disorder 05/31/2013  . Weakness 05/24/2013  . Depression 04/03/2013  . GERD (gastroesophageal reflux disease) 04/03/2013  . Atrial fibrillation 04/02/2013  . DM (diabetes mellitus) 12/28/2012  . Other complications due to renal dialysis device, implant, and graft 05/19/2012  . Diabetes  mellitus, type 2 11/13/2011  . Anemia 07/02/2011  . ESRD on hemodialysis 05/29/2011       All PT delivered under supervision of Licensed Physical Therapist.   Blima Rich, Student PT  04/06/2014, 1:43 PM  This entire session was performed under direct supervision and direction of a licensed therapist/therapist assistant . I have personally read, edited and approve of the note as written.  Jamey Reas, PT, DPT Physical Therapist Specializing in Prosthetics & Limb Loss Care Phone: [phone] FAX: [phone] [address] [place], [place] [address]

## 2014-04-11 ENCOUNTER — Encounter: Payer: Managed Care, Other (non HMO) | Admitting: Physical Therapy

## 2014-04-13 ENCOUNTER — Encounter: Payer: Managed Care, Other (non HMO) | Admitting: Physical Therapy

## 2014-04-18 ENCOUNTER — Ambulatory Visit: Payer: Managed Care, Other (non HMO) | Admitting: Physical Therapy

## 2014-04-18 ENCOUNTER — Encounter: Payer: Self-pay | Admitting: Physical Therapy

## 2014-04-18 DIAGNOSIS — R269 Unspecified abnormalities of gait and mobility: Secondary | ICD-10-CM | POA: Diagnosis not present

## 2014-04-18 DIAGNOSIS — Z89511 Acquired absence of right leg below knee: Secondary | ICD-10-CM

## 2014-04-18 DIAGNOSIS — Z89512 Acquired absence of left leg below knee: Secondary | ICD-10-CM

## 2014-04-18 NOTE — Therapy (Signed)
Physical Therapy Treatment  Patient Details  Name: Paul [name] MRN: [id number] Date of Birth: 10/01/1955  Encounter Date: 04/18/2014      PT End of Session - 04/18/14 1037    Visit Number 22   Number of Visits 40   Date for PT Re-Evaluation 06/17/14   PT Start Time 0810   PT Stop Time 0844   PT Time Calculation (min) 34 min   Equipment Utilized During Treatment Gait belt   Activity Tolerance Patient tolerated treatment well   Behavior During Therapy [facility] for tasks assessed/performed      Past Medical History  Diagnosis Date  . Open wound of both legs with complication     Pt has had progressive wounds of both LE's including gangrene of the toes and patchy necrosis of the calves.  He has been treated at [facility] by Dr Jerline Pain with hyperbaric O2 5d per week.  He is getting Na Thiosulfate with HD for suspected calciphylaxis. Pt says doctor's aren't sure if these ulcers were diabetic ulcers or calciphylaxis.  He underwent bilat BKA on 07/30/13.  He says that the woun  . ESRD on hemodialysis     Pt has ESRD due to DM.  He had a L upper arm AVF prior to starting HD which was ligated due to L arm steal syndrome.  He started HD in Jan 2014 and did home HD.  The family couldn't cannulate the R arm AVF successfully so by mid 2014 they decided to switch to PD which was done late summer 2014.  In Jan 2015 he was admitted with FTT felt to be due to underdialysis on PD and PD was abandoned and he   . Anemia     esrd  . Hypertension     off of meds due to orthostatic hypotension  . Closed left arm fracture 1967  . Heart murmur   . Corneal abrasion, left   . GERD (gastroesophageal reflux disease)   . History of blood transfusion   . Glomerulosclerosis, diabetic   . Depression   . A-fib   . Calciphylaxis 05/2013    lower ext  . Diabetes mellitus     Type 1 iddm x 11 yrs  . Diabetic neuropathy   . Cancer     hx duodenal adenoCa 2002, resected    Past Surgical History  Procedure  Laterality Date  . Whipple procedure  2002    duodenal ca, Dr. Harlow Asa  . Bone marrow biopsy  2012  . Renal biopsy, percutaneous  2012  . Tonsillectomy    . Portacath placement  2002  . Biliary diversion, external  2002  . Other surgical history      Cyst removed from back  . Av fistula placement  06/07/2011    Procedure: ARTERIOVENOUS (AV) FISTULA CREATION;  Surgeon: Angelia Mould, MD;  Location: [place];  Service: Vascular;  Laterality: Left;  . Ligation of arteriovenous  fistula  05/22/2012    Procedure: LIGATION OF ARTERIOVENOUS  FISTULA;  Surgeon: Mal Misty, MD;  Location: [place];  Service: Vascular;  Laterality: Left;  Left brachio-cephalic fistula  . Insertion of dialysis catheter  05/22/2012    Procedure: INSERTION OF DIALYSIS CATHETER;  Surgeon: Mal Misty, MD;  Location: [place];  Service: Vascular;  Laterality: N/A;  right internal jugular vein  . Av fistula placement  06/18/2012    Procedure: ARTERIOVENOUS (AV) FISTULA CREATION;  Surgeon: Mal Misty, MD;  Location: [place];  Service: Vascular;  Laterality: Right;  CIMINO  . Colonoscopy      Hx: of  . Capd insertion N/A 01/14/2013    Procedure: LAPAROSCOPIC INSERTION CONTINUOUS AMBULATORY PERITONEAL DIALYSIS  (CAPD) CATHETER;  Surgeon: Adin Hector, MD;  Location: [place];  Service: General;  Laterality: N/A;  . Vascular surgery    . Removal of a dialysis catheter    . Capd removal N/A 07/09/2013    Procedure: CONTINUOUS AMBULATORY PERITONEAL DIALYSIS  (CAPD) CATHETER REMOVAL;  Surgeon: Adin Hector, MD;  Location: [place];  Service: General;  Laterality: N/A;  . Amputation Bilateral 07/30/2013    Procedure: AMPUTATION BELOW KNEE;  Surgeon: Newt Minion, MD;  Location: [place];  Service: Orthopedics;  Laterality: Bilateral;  Bilateral Below Knee Amputations  . Amputation Bilateral 09/03/2013    Procedure: AMPUTATION BELOW KNEE;  Surgeon: Newt Minion, MD;  Location: [place];  Service: Orthopedics;  Laterality: Bilateral;   Bilateral Below Knee Amputation Revision    There were no vitals taken for this visit.  Visit Diagnosis:  Abnormality of gait  S/P bilateral BKA (below knee amputation)      Subjective Assessment - 04/18/14 0816    Symptoms Went to the beach and walked in the sand with RW and Forearm crutches on separate attempts. Going back to work today.   Currently in Pain? No/denies            [facility] Adult PT Treatment/Exercise - 04/18/14 0800    Transfers   Transfers Sit to Stand;Stand to Sit   Sit to Stand With upper extremity assist;From chair/3-in-1;5: Supervision   Sit to Stand Details (indicate cue type and reason) requires increased time to perform   Stand to Sit 5: Supervision   Stand to Sit Details requires increased time and 1 UE support to reach for sitting surface for safety   Ambulation/Gait   Ambulation/Gait Yes   Ambulation/Gait Assistance 5: Supervision;4: Min assist  min guard during ambulation w/ 1 forearm crutch   Ambulation/Gait Assistance Details ambulated with 2 forearm crutches with 4 point gait pattern and 1 forearm crutch (x1 in RUE; x1 in LUE). Pt demonstrated decreased balance and stability when ambulating with 1 crutch but demonstrated ability to adjust balance appropriately when ambulation becomes unsteady.    Ambulation Distance (Feet) 345 Feet   345' x1 w 2 crutches; 115' LUE crutch; 115' RUE crutch   Assistive device Lofstrands & bilateral BKA prostheses   Gait Pattern Step-through pattern;Step-to pattern;Trunk flexed  ambulating w/ 1 crutch, decreased step through   Gait velocity decreased  with 1 forearm crutch more so than 2 forearm crutches   Ramp 5: Supervision   Ramp Details (indicate cue type and reason) requires increased time but no cues  performed x2   Curb 4: Min assist  min guard with indoor (7 inch) curb   Curb Details (indicate cue type and reason) difficulty comes from not having more stable surface for BUE to use to stabilize to compensate  for weak quadriceps bilaterally.    Prosthetics   Current prosthetic wear tolerance (days/week)  7 days/wk   Current prosthetic wear tolerance (#hours/day)  >90% of awake hours with no skin changes or tenderness          PT Education - 04/18/14 1037    Education provided Yes   Education Details HEP frequency/duration, adjusting ply socks during the day; donning long pants with prostheses   Person(s) Educated Patient, wife   Methods Explanation  Comprehension Verbalized understanding;Need further instruction          PT Short Term Goals - 04/18/14 0800    PT SHORT TERM GOAL #1   Title Pt will be able to: Ambulate 500 ft. with forearm crutches and prostheses with supervision. (04/18/14)   Time 4   Period Weeks   Status On-going   PT SHORT TERM GOAL #2   Title Pt will be able to: Ambulate 100 ft. on uneven (grass) sufaces with forearm crutches and prostheses with contact assist. (04/18/14)   Time 4   Period Weeks   Status Achieved    PT SHORT TERM GOAL #3   Title Pt will be at to:  Perform standing activities for >15 minutes with prostheses with back pain </= 2/10. (04/18/14)   Time 4   Period Weeks   Status Achieved   PT SHORT TERM GOAL #4   Title Pt will be able to: reaches 5" & looks over shoulders with support of 1 crutch modified independent. (04/18/14)   Time 4   Period Weeks   Status On-going   PT SHORT TERM GOAL #5   Title Pt will be able to: negotiate ramp & curb with forearm crutches & prostheses with minimal assist. (04/18/14)   Time 4   Period Weeks   Status Achieved          PT Long Term Goals - 04/18/14 0800    PT LONG TERM GOAL #1   Title Pt will be able to: Ambulate 594f with forearm crutches & prostheses modified independent. (06/17/14)   Time 3   Period Months   Status On-going   PT LONG TERM GOAL #2   Title Pt will be able to: ambulate 200' on uneven (grass) surfaces with forearm crutches & prostheses modified independent.  (06/17/14)   Time  3   Period Months   Status On-going   PT LONG TERM GOAL #3   Title Pt will be able to: negotiate ramp, curb, stairs with forearm crutches & prostheses modified independent.  (06/17/14)   Time 3   Period Months   Status On-going   PT LONG TERM GOAL #4   Title Pt will be able to: perform standing activities for > 20 minutes & prostheses with no pain or discomfort.  (06/17/14)   Time 3   Period Months   Status On-going   PT LONG TERM GOAL #5   Title Pt will be able to: sit to/from stand from chairs without armrest pushing with UEs, reaches 5" & looks over shoulders without UE support safely.  (06/17/14)   Time 3   Period Months   Status On-going   PT LONG TERM GOAL #6   Title Pt will be able to: self-report via FOTO improvement >10points in Functional Status Measure  (06/17/14)   Time 3   Period Months   Status On-going          Plan - 04/18/14 1038    Clinical Impression Statement Patient performs mobility well, demonstrating ability to use lofstrand crutches outside of therapy with family present. Patient still requires more training on curb navigation with BUE support using forearm crutches. Patient performed well with introduction of ambulation using only 1 forearm crutch but still requires more training to increase balance/stability while also improving symmetry of step length and foot placement.    Pt will benefit from skilled therapeutic intervention in order to improve on the following deficits Abnormal gait;Decreased activity tolerance;Decreased balance;Decreased knowledge of use of  DME;Decreased mobility;Decreased strength;Decreased endurance;Difficulty walking   Rehab Potential Good   PT Frequency 2x / week   PT Duration 8 weeks   PT Treatment/Interventions ADLs/Self Care Home Management;DME Instruction;Gait training;Stair training;Functional mobility training;Therapeutic activities;Therapeutic exercise;Balance training;Neuromuscular re-education;Patient/family education;Other  (comment)  prosthetic training   PT Next Visit Plan Review HEP compliance. Continue mobility with 1 forearm crutch (+) object in free UE, progress to ramp, curb, stairs. Continue curb practice.   Consulted and Agree with Plan of Care Patient   PT Plan Continue outdoor ambulation and curb training with forearm crutches.         Problem List Patient Active Problem List   Diagnosis Date Noted  . Abnormal cardiovascular stress test 12/29/2013  . Awaiting organ transplant 12/09/2013  . Leg ulcer 11/17/2013  . Chronic kidney disease (CKD), stage V 11/17/2013  . Diabetes 11/17/2013  . Cancer of duodenum 11/17/2013  . Dyslipidemia 11/17/2013  . Chronic kidney disease requiring chronic dialysis 11/17/2013  . Hypercholesteremia 11/17/2013  . H/O malignant neoplasm 11/17/2013  . H/O: HTN (hypertension) 11/17/2013  . BP (high blood pressure) 11/17/2013  . Hypotension 11/17/2013  . Angiopathy, peripheral 11/17/2013  . AF (paroxysmal atrial fibrillation) 11/17/2013  . BKA stump complication 63/87/5643  . S/P bilateral BKA (below knee amputation) 08/03/2013  . S/P BKA (below knee amputation) bilateral 07/30/2013  . Open wound of both legs with complication 32/95/1884  . Acute blood loss anemia 07/12/2013  . Syncope 07/09/2013  . Intolerance to CAPD peritoneal dialysis s/p CAPD cath removal 07/09/2013 07/05/2013  . Critical lower limb ischemia 06/30/2013  . Extremity pain 06/30/2013  . Atherosclerosis of native arteries of the extremities with ulceration(440.23) 06/17/2013  . Gait disorder 05/31/2013  . Weakness 05/24/2013  . Depression 04/03/2013  . GERD (gastroesophageal reflux disease) 04/03/2013  . Atrial fibrillation 04/02/2013  . DM (diabetes mellitus) 12/28/2012  . Other complications due to renal dialysis device, implant, and graft 05/19/2012  . Diabetes mellitus, type 2 11/13/2011  . Anemia 07/02/2011  . ESRD on hemodialysis 05/29/2011       All PT delivered under  supervision of Licensed Physical Therapist.   Blima Rich, Student PT 04/18/2014, 10:43 AM   This entire session was performed under direct supervision and direction of a licensed therapist/therapist assistant . I have personally read, edited and approve of the note as written.  Jamey Reas, PT, DPT 04/18/2014 11:22 AM Phone:  [phone]  Fax:  [phone]

## 2014-04-20 ENCOUNTER — Encounter: Payer: Self-pay | Admitting: Physical Therapy

## 2014-04-20 ENCOUNTER — Ambulatory Visit: Payer: Managed Care, Other (non HMO) | Attending: Family Medicine | Admitting: Physical Therapy

## 2014-04-20 DIAGNOSIS — I120 Hypertensive chronic kidney disease with stage 5 chronic kidney disease or end stage renal disease: Secondary | ICD-10-CM | POA: Insufficient documentation

## 2014-04-20 DIAGNOSIS — N186 End stage renal disease: Secondary | ICD-10-CM | POA: Insufficient documentation

## 2014-04-20 DIAGNOSIS — R5381 Other malaise: Secondary | ICD-10-CM | POA: Insufficient documentation

## 2014-04-20 DIAGNOSIS — Z89512 Acquired absence of left leg below knee: Secondary | ICD-10-CM | POA: Insufficient documentation

## 2014-04-20 DIAGNOSIS — Z89511 Acquired absence of right leg below knee: Secondary | ICD-10-CM | POA: Diagnosis not present

## 2014-04-20 DIAGNOSIS — E119 Type 2 diabetes mellitus without complications: Secondary | ICD-10-CM | POA: Diagnosis not present

## 2014-04-20 DIAGNOSIS — R269 Unspecified abnormalities of gait and mobility: Secondary | ICD-10-CM | POA: Diagnosis not present

## 2014-04-20 DIAGNOSIS — I4891 Unspecified atrial fibrillation: Secondary | ICD-10-CM | POA: Insufficient documentation

## 2014-04-20 NOTE — Therapy (Signed)
[facility] [address] [place], [place], [address] Phone: [phone]   Fax:  [phone]  Physical Therapy Treatment  Patient Details  Name: SHAFIN POLLIO MRN: [id number] Date of Birth: 05-07-1956  Encounter Date: 04/20/2014      PT End of Session - 04/20/14 1200    Visit Number 23   Number of Visits 40   Date for PT Re-Evaluation 06/17/14   PT Start Time 0801   PT Stop Time 0845   PT Time Calculation (min) 44 min   Equipment Utilized During Treatment Gait belt   Activity Tolerance Patient tolerated treatment well   Behavior During Therapy [facility] for tasks assessed/performed      Past Medical History  Diagnosis Date  . Open wound of both legs with complication     Pt has had progressive wounds of both LE's including gangrene of the toes and patchy necrosis of the calves.  He has been treated at [facility] by Dr Jerline Pain with hyperbaric O2 5d per week.  He is getting Na Thiosulfate with HD for suspected calciphylaxis. Pt says doctor's aren't sure if these ulcers were diabetic ulcers or calciphylaxis.  He underwent bilat BKA on 07/30/13.  He says that the woun  . ESRD on hemodialysis     Pt has ESRD due to DM.  He had a L upper arm AVF prior to starting HD which was ligated due to L arm steal syndrome.  He started HD in Jan 2014 and did home HD.  The family couldn't cannulate the R arm AVF successfully so by mid 2014 they decided to switch to PD which was done late summer 2014.  In Jan 2015 he was admitted with FTT felt to be due to underdialysis on PD and PD was abandoned and he   . Anemia     esrd  . Hypertension     off of meds due to orthostatic hypotension  . Closed left arm fracture 1967  . Heart murmur   . Corneal abrasion, left   . GERD (gastroesophageal reflux disease)   . History of blood transfusion   . Glomerulosclerosis, diabetic   . Depression   . A-fib   . Calciphylaxis 05/2013    lower ext  . Diabetes mellitus      Type 1 iddm x 11 yrs  . Diabetic neuropathy   . Cancer     hx duodenal adenoCa 2002, resected    Past Surgical History  Procedure Laterality Date  . Whipple procedure  2002    duodenal ca, Dr. Harlow Asa  . Bone marrow biopsy  2012  . Renal biopsy, percutaneous  2012  . Tonsillectomy    . Portacath placement  2002  . Biliary diversion, external  2002  . Other surgical history      Cyst removed from back  . Av fistula placement  06/07/2011    Procedure: ARTERIOVENOUS (AV) FISTULA CREATION;  Surgeon: Angelia Mould, MD;  Location: [place];  Service: Vascular;  Laterality: Left;  . Ligation of arteriovenous  fistula  05/22/2012    Procedure: LIGATION OF ARTERIOVENOUS  FISTULA;  Surgeon: Mal Misty, MD;  Location: [place];  Service: Vascular;  Laterality: Left;  Left brachio-cephalic fistula  . Insertion of dialysis catheter  05/22/2012    Procedure: INSERTION OF DIALYSIS CATHETER;  Surgeon: Mal Misty, MD;  Location: [place];  Service: Vascular;  Laterality: N/A;  right internal jugular vein  . Av fistula placement  06/18/2012    Procedure: ARTERIOVENOUS (AV) FISTULA CREATION;  Surgeon: Mal Misty, MD;  Location: [place];  Service: Vascular;  Laterality: Right;  [place]  . Colonoscopy      Hx: of  . Capd insertion N/A 01/14/2013    Procedure: LAPAROSCOPIC INSERTION CONTINUOUS AMBULATORY PERITONEAL DIALYSIS  (CAPD) CATHETER;  Surgeon: Adin Hector, MD;  Location: [place];  Service: General;  Laterality: N/A;  . Vascular surgery    . Removal of a dialysis catheter    . Capd removal N/A 07/09/2013    Procedure: CONTINUOUS AMBULATORY PERITONEAL DIALYSIS  (CAPD) CATHETER REMOVAL;  Surgeon: Adin Hector, MD;  Location: [place];  Service: General;  Laterality: N/A;  . Amputation Bilateral 07/30/2013    Procedure: AMPUTATION BELOW KNEE;  Surgeon: Newt Minion, MD;  Location: [place];  Service: Orthopedics;  Laterality: Bilateral;  Bilateral Below Knee Amputations  . Amputation Bilateral  09/03/2013    Procedure: AMPUTATION BELOW KNEE;  Surgeon: Newt Minion, MD;  Location: [place];  Service: Orthopedics;  Laterality: Bilateral;  Bilateral Below Knee Amputation Revision    There were no vitals taken for this visit.  Visit Diagnosis:  Abnormality of gait  S/P bilateral BKA (below knee amputation)  Debility      Subjective Assessment - 04/20/14 1321    Symptoms No changes, working on balance and exercises at home, it starting back at work part time (30 hours a week; 7 hrs MWF, 5 hrs T/TH)   Currently in Pain? No/denies            [facility] Adult PT Treatment/Exercise - 04/20/14 0800    Transfers   Transfers Sit to Stand;Stand to Sit   Sit to Stand 5: Supervision;With upper extremity assist;From chair/3-in-1   Sit to Stand Details (indicate cue type and reason) no cues required; supervision in case of balance loss   Stand to Sit 5: Supervision   Stand to Sit Details no cues required; supervision in case of balance loss   Ambulation/Gait   Ambulation/Gait Yes   Ambulation/Gait Assistance 5: Supervision   Ambulation/Gait Assistance Details ambulated with symmetrical 4 point gait pattern with forearm crutches with proper sequencing and decreased gait speed. Pt ambulated 125 feet with 1 lofstrand crutch in RUE with step through gait pattern, decreased gait speed, requiring increased assistance (min assist) for balance and weight shifting control.    Ambulation Distance (Feet) 575 Feet  x1; 125 x1 with 1 crutch   Assistive device Lofstrands  BLE BKA prosthesis   Gait Pattern Step-through pattern;Step-to pattern;Trunk flexed   Gait velocity decreased   Standardized Balance Assessment   Standardized Balance Assessment Berg Balance Test   Berg Balance Test   Sit to Stand Able to stand  independently using hands   Standing Unsupported Able to stand 2 minutes with supervision   Sitting with Back Unsupported but Feet Supported on Floor or Stool Able to sit safely and securely  2 minutes   Stand to Sit Controls descent by using hands   Transfers Able to transfer safely, definite need of hands   Standing Unsupported with Eyes Closed Able to stand 10 seconds with supervision   Standing Ubsupported with Feet Together Able to place feet together independently and stand for 1 minute with supervision   From Standing, Reach Forward with Outstretched Arm Can reach forward >12 cm safely (5")   From Standing Position, Pick up Object from Floor Able to pick up shoe, needs supervision   From  Standing Position, Turn to Look Behind Over each Shoulder Turn sideways only but maintains balance   Turn 360 Degrees Needs assistance while turning   Standing Unsupported, Alternately Place Feet on Step/Stool Needs assistance to keep from falling or unable to try   Standing Unsupported, One Foot in Front Needs help to step but can hold 15 seconds   Standing on One Leg Tries to lift leg/unable to hold 3 seconds but remains standing independently   Total Score 32   Prosthetics   Current prosthetic wear tolerance (days/week)  7   Current prosthetic wear tolerance (#hours/day)  >90% of awake hours          PT Education - 04/20/14 1337    Education provided Yes   Education Details HEP; static balance with BUE support (RW) available, sit<>stand with decreased UE use   Person(s) Educated Patient   Methods Explanation   Comprehension Verbalized understanding          PT Short Term Goals - 04/20/14 1331    PT SHORT TERM GOAL #1   Baseline MET 04/20/14   Additional Short Term Goals   Additional Short Term Goals Yes   PT SHORT TERM GOAL #6   Title Patient will ambulate house hold distance of 76ft with 1 crutch while carrying a cup of water with supervision of PT (Target Date: 05/18/14)   PT SHORT TERM GOAL #7   Title Patient will improve BERG balance > 36/56 (Target Date: 05/18/14)   Time 4   Period Weeks   Status New   PT SHORT TERM GOAL #8   Title ambulates 700' with forearm  crutches modified independent. (Target Date: 05/18/14)   Time 4   Period Weeks   Status New   PT SHORT TERM GOAL #9   TITLE negotiate ramp & curb with forearm crutches & bilateral prostheses with supervision. (Target Date: 05/18/14)   Time 4   Period Weeks   Status New          PT Long Term Goals - 04/20/14 1401    PT LONG TERM GOAL #1   Title Pt will be able to: Ambulate 867ft with LRAD & prostheses modified independent. (06/17/14)   Time 2   Period Months   Status Revised   PT LONG TERM GOAL #2   Title Pt will be able to: ambulate 200' on uneven (grass) surfaces with LRAD & prostheses modified independent.  (06/17/14)   Time 2   Period Months   Status Revised   PT LONG TERM GOAL #3   Title Pt will be able to: negotiate ramp, curb, stairs with LRAD & prostheses modified independent.  (06/17/14)   Time 2   Period Months   Status Revised   PT LONG TERM GOAL #4   Title Pt will be able to: perform standing activities for > 20 minutes & prostheses with no pain or discomfort.  (06/17/14)   Time 2   Period Months   Status Revised   PT LONG TERM GOAL #5   Title Berg Balance >/= 45/56 (Target Date: 06/17/14)   Time 2   Period Months   Status Revised   PT LONG TERM GOAL #6   Title Pt will be able to: self-report via FOTO improvement >10points in Functional Status Measure  (06/17/14)   Time 2   Period Months   Status On-going          Plan - 04/20/14 1201    Clinical Impression Statement Patient demonstrated  ability to ambulate with BUE lofstrand crutch use with no cues and supervision 568ft on flat surfaces, and ability to ambulate with 1 crutch 173ft with minimal assistance for balance. During BERG balance re-test patient demonstrated increase static and dynamic standing balance but still has dificulty taking steps with no UE support and balancing on narrow base of support without use of UE's.   Pt will benefit from skilled therapeutic intervention in order to improve on the  following deficits Abnormal gait;Decreased activity tolerance;Decreased balance;Decreased knowledge of use of DME;Decreased mobility;Decreased strength;Decreased endurance;Difficulty walking   PT Frequency 2x / week   PT Duration 8 weeks   PT Treatment/Interventions ADLs/Self Care Home Management;DME Instruction;Gait training;Stair training;Functional mobility training;Therapeutic activities;Therapeutic exercise;Balance training;Neuromuscular re-education;Patient/family education;Other (comment)  prosthetic training   PT Next Visit Plan Progress gait training with 1 crutch, work on dynamic standing balance and taking small steps with 1 UE and no UE support.   Consulted and Agree with Plan of Care Patient                               Problem List Patient Active Problem List   Diagnosis Date Noted  . Abnormal cardiovascular stress test 12/29/2013  . Awaiting organ transplant 12/09/2013  . Leg ulcer 11/17/2013  . Chronic kidney disease (CKD), stage V 11/17/2013  . Diabetes 11/17/2013  . Cancer of duodenum 11/17/2013  . Dyslipidemia 11/17/2013  . Chronic kidney disease requiring chronic dialysis 11/17/2013  . Hypercholesteremia 11/17/2013  . H/O malignant neoplasm 11/17/2013  . H/O: HTN (hypertension) 11/17/2013  . BP (high blood pressure) 11/17/2013  . Hypotension 11/17/2013  . Angiopathy, peripheral 11/17/2013  . AF (paroxysmal atrial fibrillation) 11/17/2013  . BKA stump complication 60/45/4098  . S/P bilateral BKA (below knee amputation) 08/03/2013  . S/P BKA (below knee amputation) bilateral 07/30/2013  . Open wound of both legs with complication 11/91/4782  . Acute blood loss anemia 07/12/2013  . Syncope 07/09/2013  . Intolerance to CAPD peritoneal dialysis s/p CAPD cath removal 07/09/2013 07/05/2013  . Critical lower limb ischemia 06/30/2013  . Extremity pain 06/30/2013  . Atherosclerosis of native arteries of the extremities with ulceration(440.23)  06/17/2013  . Gait disorder 05/31/2013  . Weakness 05/24/2013  . Depression 04/03/2013  . GERD (gastroesophageal reflux disease) 04/03/2013  . Atrial fibrillation 04/02/2013  . DM (diabetes mellitus) 12/28/2012  . Other complications due to renal dialysis device, implant, and graft 05/19/2012  . Diabetes mellitus, type 2 11/13/2011  . Anemia 07/02/2011  . ESRD on hemodialysis 05/29/2011    Babyboy Loya 04/20/2014, 2:30 PM  This entire session was performed under direct supervision and direction of a licensed therapist/therapist assistant . I have personally read, edited and approve of the note as re-written.  Blima Rich, Student PT  Jamey Reas, PT, DPT PT Specializing in [place] 04/20/2014 2:31 PM Phone:  [phone]  Fax:  [phone] [place] [address] [place], [place] [address]

## 2014-04-20 NOTE — Therapy (Signed)
[facility] [address] [place], [place], [address] Phone: [phone]   Fax:  [phone]  Physical Therapy Treatment  Patient Details  Name: Paul [name] MRN: [id number] Date of Birth: 05-07-1956  Encounter Date: 04/20/2014      PT End of Session - 04/20/14 1200    Visit Number 23   Number of Visits 40   Date for PT Re-Evaluation 06/17/14   PT Start Time 0801   PT Stop Time 0845   PT Time Calculation (min) 44 min   Equipment Utilized During Treatment Gait belt   Activity Tolerance Patient tolerated treatment well   Behavior During Therapy [facility] for tasks assessed/performed      Past Medical History  Diagnosis Date  . Open wound of both legs with complication     Pt has had progressive wounds of both LE's including gangrene of the toes and patchy necrosis of the calves.  He has been treated at [facility] by Dr Jerline Pain with hyperbaric O2 5d per week.  He is getting Na Thiosulfate with HD for suspected calciphylaxis. Pt says doctor's aren't sure if these ulcers were diabetic ulcers or calciphylaxis.  He underwent bilat BKA on 07/30/13.  He says that the woun  . ESRD on hemodialysis     Pt has ESRD due to DM.  He had a L upper arm AVF prior to starting HD which was ligated due to L arm steal syndrome.  He started HD in Jan 2014 and did home HD.  The family couldn't cannulate the R arm AVF successfully so by mid 2014 they decided to switch to PD which was done late summer 2014.  In Jan 2015 he was admitted with FTT felt to be due to underdialysis on PD and PD was abandoned and he   . Anemia     esrd  . Hypertension     off of meds due to orthostatic hypotension  . Closed left arm fracture 1967  . Heart murmur   . Corneal abrasion, left   . GERD (gastroesophageal reflux disease)   . History of blood transfusion   . Glomerulosclerosis, diabetic   . Depression   . A-fib   . Calciphylaxis 05/2013    lower ext  . Diabetes mellitus      Type 1 iddm x 11 yrs  . Diabetic neuropathy   . Cancer     hx duodenal adenoCa 2002, resected    Past Surgical History  Procedure Laterality Date  . Whipple procedure  2002    duodenal ca, Dr. Harlow Asa  . Bone marrow biopsy  2012  . Renal biopsy, percutaneous  2012  . Tonsillectomy    . Portacath placement  2002  . Biliary diversion, external  2002  . Other surgical history      Cyst removed from back  . Av fistula placement  06/07/2011    Procedure: ARTERIOVENOUS (AV) FISTULA CREATION;  Surgeon: Angelia Mould, MD;  Location: [place];  Service: Vascular;  Laterality: Left;  . Ligation of arteriovenous  fistula  05/22/2012    Procedure: LIGATION OF ARTERIOVENOUS  FISTULA;  Surgeon: Mal Misty, MD;  Location: [place];  Service: Vascular;  Laterality: Left;  Left brachio-cephalic fistula  . Insertion of dialysis catheter  05/22/2012    Procedure: INSERTION OF DIALYSIS CATHETER;  Surgeon: Mal Misty, MD;  Location: [place];  Service: Vascular;  Laterality: N/A;  right internal jugular vein  . Av fistula placement  06/18/2012    Procedure: ARTERIOVENOUS (AV) FISTULA CREATION;  Surgeon: Mal Misty, MD;  Location: [place];  Service: Vascular;  Laterality: Right;  [place]  . Colonoscopy      Hx: of  . Capd insertion N/A 01/14/2013    Procedure: LAPAROSCOPIC INSERTION CONTINUOUS AMBULATORY PERITONEAL DIALYSIS  (CAPD) CATHETER;  Surgeon: Adin Hector, MD;  Location: [place];  Service: General;  Laterality: N/A;  . Vascular surgery    . Removal of a dialysis catheter    . Capd removal N/A 07/09/2013    Procedure: CONTINUOUS AMBULATORY PERITONEAL DIALYSIS  (CAPD) CATHETER REMOVAL;  Surgeon: Adin Hector, MD;  Location: [place];  Service: General;  Laterality: N/A;  . Amputation Bilateral 07/30/2013    Procedure: AMPUTATION BELOW KNEE;  Surgeon: Newt Minion, MD;  Location: [place];  Service: Orthopedics;  Laterality: Bilateral;  Bilateral Below Knee Amputations  . Amputation Bilateral  09/03/2013    Procedure: AMPUTATION BELOW KNEE;  Surgeon: Newt Minion, MD;  Location: [place];  Service: Orthopedics;  Laterality: Bilateral;  Bilateral Below Knee Amputation Revision    There were no vitals taken for this visit.  Visit Diagnosis:  Abnormality of gait  S/P bilateral BKA (below knee amputation)  Debility      Subjective Assessment - 04/20/14 1321    Symptoms No changes, working on balance and exercises at home, it starting back at work part time (30 hours a week; 7 hrs MWF, 5 hrs T/TH)   Currently in Pain? No/denies            [facility] Adult PT Treatment/Exercise - 04/20/14 0800    Transfers   Transfers Sit to Stand;Stand to Sit   Sit to Stand 5: Supervision;With upper extremity assist;From chair/3-in-1   Sit to Stand Details (indicate cue type and reason) no cues required; supervision in case of balance loss   Stand to Sit 5: Supervision   Stand to Sit Details no cues required; supervision in case of balance loss   Ambulation/Gait   Ambulation/Gait Yes   Ambulation/Gait Assistance 5: Supervision   Ambulation/Gait Assistance Details ambulated with symmetrical 4 point gait pattern with forearm crutches with proper sequencing and decreased gait speed. Pt ambulated 125 feet with 1 lofstrand crutch in RUE with step through gait pattern, decreased gait speed, requiring increased assistance (min assist) for balance and weight shifting control.    Ambulation Distance (Feet) 575 Feet  x1; 125 x1 with 1 crutch   Assistive device Lofstrands  BLE BKA prosthesis   Gait Pattern Step-through pattern;Step-to pattern;Trunk flexed   Gait velocity decreased   Standardized Balance Assessment   Standardized Balance Assessment Berg Balance Test   Berg Balance Test   Sit to Stand Able to stand  independently using hands   Standing Unsupported Able to stand safely 2 minutes   Sitting with Back Unsupported but Feet Supported on Floor or Stool Able to sit safely and securely 2 minutes    Stand to Sit Sits safely with minimal use of hands   Transfers Able to transfer safely, definite need of hands   Standing Unsupported with Eyes Closed Able to stand 10 seconds with supervision   Standing Ubsupported with Feet Together Able to place feet together independently and stand for 1 minute with supervision   From Standing, Reach Forward with Outstretched Arm Can reach confidently >25 cm (10")   From Standing Position, Pick up Object from Floor Able to pick up shoe, needs supervision   From  Standing Position, Turn to Look Behind Over each Shoulder Turn sideways only but maintains balance   Turn 360 Degrees Needs assistance while turning   Standing Unsupported, Alternately Place Feet on Step/Stool Needs assistance to keep from falling or unable to try   Standing Unsupported, One Foot in Front Needs help to step but can hold 15 seconds   Standing on One Leg Tries to lift leg/unable to hold 3 seconds but remains standing independently   Total Score 35   Prosthetics   Current prosthetic wear tolerance (days/week)  7   Current prosthetic wear tolerance (#hours/day)  >90% of awake hours          PT Education - 04/20/14 1337    Education provided Yes   Education Details HEP; static balance with BUE support (RW) available, sit<>stand with decreased UE use   Person(s) Educated Patient   Methods Explanation   Comprehension Verbalized understanding          PT Short Term Goals - 04/20/14 1331    PT SHORT TERM GOAL #7   Title Patient will improve BERG balance store to 44/56 (05/18/14)   Time 4   Period Weeks   Status New          PT Long Term Goals - 04/20/14 0800    PT LONG TERM GOAL #1   Title Pt will be able to: Ambulate 5104f with LRAD & prostheses modified independent. (06/17/14)   Time 3   Period Months   Status On-going   PT LONG TERM GOAL #2   Title Pt will be able to: ambulate 200' on uneven (grass) surfaces with LRAD & prostheses modified independent.   (06/17/14)   Time 3   Period Months   Status On-going   PT LONG TERM GOAL #3   Title Pt will be able to: negotiate ramp, curb, stairs with LRAD & prostheses modified independent.  (06/17/14)   Time 3   Period Months   Status On-going   PT LONG TERM GOAL #4   Title Pt will be able to: perform standing activities for > 20 minutes & prostheses with no pain or discomfort.  (06/17/14)   Time 3   Period Months   Status On-going   PT LONG TERM GOAL #5   Title Pt will be able to: sit to/from stand from chairs without armrest pushing with UEs, reaches 5" & looks over shoulders without UE support safely.  (06/17/14)   Time 3   Period Months   Status On-going   PT LONG TERM GOAL #6   Title Pt will be able to: self-report via FOTO improvement >10points in Functional Status Measure  (06/17/14)   Time 3   Period Months   Status On-going          Plan - 04/20/14 1201    Clinical Impression Statement Patient demonstrated ability to ambulate with BUE lofstrand crutch use with no cues and supervision 5028fon flat surfaces, and ability to ambulate with 1 crutch 12575fith minimal assistance for balance. During BERG balance re-test patient demonstrated increase static and dynamic standing balance but still has dificulty taking steps with no UE support and balancing on narrow base of support without use of UE's.   Pt will benefit from skilled therapeutic intervention in order to improve on the following deficits Abnormal gait;Decreased activity tolerance;Decreased balance;Decreased knowledge of use of DME;Decreased mobility;Decreased strength;Decreased endurance;Difficulty walking   PT Frequency 2x / week   PT Duration 8 weeks  PT Treatment/Interventions ADLs/Self Care Home Management;DME Instruction;Gait training;Stair training;Functional mobility training;Therapeutic activities;Therapeutic exercise;Balance training;Neuromuscular re-education;Patient/family education;Other (comment)  prosthetic training    PT Next Visit Plan Progress gait training with 1 crutch, work on dynamic standing balance and taking small steps with 1 UE and no UE support.   Consulted and Agree with Plan of Care Patient        Problem List Patient Active Problem List   Diagnosis Date Noted  . Abnormal cardiovascular stress test 12/29/2013  . Awaiting organ transplant 12/09/2013  . Leg ulcer 11/17/2013  . Chronic kidney disease (CKD), stage V 11/17/2013  . Diabetes 11/17/2013  . Cancer of duodenum 11/17/2013  . Dyslipidemia 11/17/2013  . Chronic kidney disease requiring chronic dialysis 11/17/2013  . Hypercholesteremia 11/17/2013  . H/O malignant neoplasm 11/17/2013  . H/O: HTN (hypertension) 11/17/2013  . BP (high blood pressure) 11/17/2013  . Hypotension 11/17/2013  . Angiopathy, peripheral 11/17/2013  . AF (paroxysmal atrial fibrillation) 11/17/2013  . BKA stump complication 00/71/2197  . S/P bilateral BKA (below knee amputation) 08/03/2013  . S/P BKA (below knee amputation) bilateral 07/30/2013  . Open wound of both legs with complication 58/83/2549  . Acute blood loss anemia 07/12/2013  . Syncope 07/09/2013  . Intolerance to CAPD peritoneal dialysis s/p CAPD cath removal 07/09/2013 07/05/2013  . Critical lower limb ischemia 06/30/2013  . Extremity pain 06/30/2013  . Atherosclerosis of native arteries of the extremities with ulceration(440.23) 06/17/2013  . Gait disorder 05/31/2013  . Weakness 05/24/2013  . Depression 04/03/2013  . GERD (gastroesophageal reflux disease) 04/03/2013  . Atrial fibrillation 04/02/2013  . DM (diabetes mellitus) 12/28/2012  . Other complications due to renal dialysis device, implant, and graft 05/19/2012  . Diabetes mellitus, type 2 11/13/2011  . Anemia 07/02/2011  . ESRD on hemodialysis 05/29/2011    All PT delivered under supervision of Licensed Physical Therapist.   Blima Rich, Student PT 04/20/2014, 1:38 PM     SEE UPDATED PT TREATMENT NOTE BY Jamey Reas, PT, DPT  Jamey Reas, PT, DPT PT Specializing in [place] 04/20/2014 2:32 PM Phone:  [phone]  Fax:  [phone] [place] [address] [place], [place] [address]

## 2014-04-25 ENCOUNTER — Encounter: Payer: Managed Care, Other (non HMO) | Admitting: Physical Therapy

## 2014-04-27 ENCOUNTER — Encounter: Payer: Self-pay | Admitting: Physical Therapy

## 2014-04-27 ENCOUNTER — Ambulatory Visit: Payer: Managed Care, Other (non HMO) | Admitting: Physical Therapy

## 2014-04-27 DIAGNOSIS — R269 Unspecified abnormalities of gait and mobility: Secondary | ICD-10-CM | POA: Diagnosis not present

## 2014-04-27 DIAGNOSIS — R5381 Other malaise: Secondary | ICD-10-CM

## 2014-04-27 NOTE — Therapy (Signed)
[facility] [address] [place], [place], [address] Phone: [phone]   Fax:  [phone]  Physical Therapy Treatment  Patient Details  Name: Paul [name] MRN: [id number] Date of Birth: 14-Nov-1955  Encounter Date: 04/27/2014      PT End of Session - 04/27/14 1625    Visit Number 24   Number of Visits 40   Date for PT Re-Evaluation 06/17/14   PT Start Time 0805   PT Stop Time 0845   PT Time Calculation (min) 40 min   Equipment Utilized During Treatment Gait belt   Activity Tolerance Patient tolerated treatment well   Behavior During Therapy [facility] for tasks assessed/performed      Past Medical History  Diagnosis Date  . Open wound of both legs with complication     Pt has had progressive wounds of both LE's including gangrene of the toes and patchy necrosis of the calves.  He has been treated at [facility] by Dr Jerline Pain with hyperbaric O2 5d per week.  He is getting Na Thiosulfate with HD for suspected calciphylaxis. Pt says doctor's aren't sure if these ulcers were diabetic ulcers or calciphylaxis.  He underwent bilat BKA on 07/30/13.  He says that the woun  . ESRD on hemodialysis     Pt has ESRD due to DM.  He had a L upper arm AVF prior to starting HD which was ligated due to L arm steal syndrome.  He started HD in Jan 2014 and did home HD.  The family couldn't cannulate the R arm AVF successfully so by mid 2014 they decided to switch to PD which was done late summer 2014.  In Jan 2015 he was admitted with FTT felt to be due to underdialysis on PD and PD was abandoned and he   . Anemia     esrd  . Hypertension     off of meds due to orthostatic hypotension  . Closed left arm fracture 1967  . Heart murmur   . Corneal abrasion, left   . GERD (gastroesophageal reflux disease)   . History of blood transfusion   . Glomerulosclerosis, diabetic   . Depression   . A-fib   . Calciphylaxis 05/2013    lower ext  . Diabetes mellitus      Type 1 iddm x 11 yrs  . Diabetic neuropathy   . Cancer     hx duodenal adenoCa 2002, resected    Past Surgical History  Procedure Laterality Date  . Whipple procedure  2002    duodenal ca, Dr. Harlow Asa  . Bone marrow biopsy  2012  . Renal biopsy, percutaneous  2012  . Tonsillectomy    . Portacath placement  2002  . Biliary diversion, external  2002  . Other surgical history      Cyst removed from back  . Av fistula placement  06/07/2011    Procedure: ARTERIOVENOUS (AV) FISTULA CREATION;  Surgeon: Angelia Mould, MD;  Location: [place];  Service: Vascular;  Laterality: Left;  . Ligation of arteriovenous  fistula  05/22/2012    Procedure: LIGATION OF ARTERIOVENOUS  FISTULA;  Surgeon: Mal Misty, MD;  Location: [place];  Service: Vascular;  Laterality: Left;  Left brachio-cephalic fistula  . Insertion of dialysis catheter  05/22/2012    Procedure: INSERTION OF DIALYSIS CATHETER;  Surgeon: Mal Misty, MD;  Location: [place];  Service: Vascular;  Laterality: N/A;  right internal jugular vein  . Av fistula placement  06/18/2012    Procedure: ARTERIOVENOUS (AV) FISTULA CREATION;  Surgeon: Mal Misty, MD;  Location: [place];  Service: Vascular;  Laterality: Right;  [place]  . Colonoscopy      Hx: of  . Capd insertion N/A 01/14/2013    Procedure: LAPAROSCOPIC INSERTION CONTINUOUS AMBULATORY PERITONEAL DIALYSIS  (CAPD) CATHETER;  Surgeon: Adin Hector, MD;  Location: [place];  Service: General;  Laterality: N/A;  . Vascular surgery    . Removal of a dialysis catheter    . Capd removal N/A 07/09/2013    Procedure: CONTINUOUS AMBULATORY PERITONEAL DIALYSIS  (CAPD) CATHETER REMOVAL;  Surgeon: Adin Hector, MD;  Location: [place];  Service: General;  Laterality: N/A;  . Amputation Bilateral 07/30/2013    Procedure: AMPUTATION BELOW KNEE;  Surgeon: Newt Minion, MD;  Location: [place];  Service: Orthopedics;  Laterality: Bilateral;  Bilateral Below Knee Amputations  . Amputation Bilateral  09/03/2013    Procedure: AMPUTATION BELOW KNEE;  Surgeon: Newt Minion, MD;  Location: [place];  Service: Orthopedics;  Laterality: Bilateral;  Bilateral Below Knee Amputation Revision    There were no vitals taken for this visit.  Visit Diagnosis:  Abnormality of gait  Debility      Subjective Assessment - 04/27/14 0812    Symptoms No new complaints. No falls or pain to report. Has xray's last Tuesday that showed calcificaiton areas around fx's.    Currently in Pain? No/denies            [facility] Adult PT Treatment/Exercise - 04/27/14 0833    Transfers   Sit to Stand 5: Supervision   Stand to Sit 5: Supervision   Ambulation/Gait   Ambulation/Gait Yes   Ambulation/Gait Assistance 5: Supervision   Ambulation/Gait Assistance Details first gait with bil forearm crutches (800 feet) with supervison; second and third gait trial with single crutch (one in left hand and one in right hand) with min assist, cues on posuture, crutch placement and step length. Pt with improved balance and coordination with crutch in left hand.                  Ambulation Distance (Feet) 800 Feet  x1, 125 feet x 2 reps   Assistive device Lofstrands   Gait Pattern Step-through pattern;Decreased stride length;Wide base of support   Gait velocity decreased   Prosthetics   Current prosthetic wear tolerance (days/week)  7   Current prosthetic wear tolerance (#hours/day)  >90% of awake hours   Edema none   Residual limb condition  intact per pt/spouse   Donning Prosthesis Modified independent (device/increased time)   Doffing Prosthesis Modified independent (device/increased time)            PT Short Term Goals - 04/27/14 1627    PT SHORT TERM GOAL #1   Baseline MET 04/20/14   PT SHORT TERM GOAL #6   Title Patient will ambulate house hold distance of 71ft with 1 crutch while carrying a cup of water with supervision of PT (Target Date: 05/18/14)   PT SHORT TERM GOAL #7   Title Patient will improve BERG  balance > 36/56 (Target Date: 05/18/14)   Time 4   Period Weeks   Status New   PT SHORT TERM GOAL #8   Title ambulates 700' with forearm crutches modified independent. (Target Date: 05/18/14)   Time 4   Period Weeks   Status New   PT SHORT TERM GOAL #9   TITLE negotiate ramp & curb with  forearm crutches & bilateral prostheses with supervision. (Target Date: 05/18/14)   Time 4   Period Weeks   Status New          PT Long Term Goals - 04/27/14 1627    PT LONG TERM GOAL #1   Title Pt will be able to: Ambulate 884ft with LRAD & prostheses modified independent. (06/17/14)   Time 2   Period Months   Status On-going   PT LONG TERM GOAL #2   Title Pt will be able to: ambulate 200' on uneven (grass) surfaces with LRAD & prostheses modified independent.  (06/17/14)   Time 2   Period Months   Status On-going   PT LONG TERM GOAL #3   Title Pt will be able to: negotiate ramp, curb, stairs with LRAD & prostheses modified independent.  (06/17/14)   Time 2   Period Months   Status On-going   PT LONG TERM GOAL #4   Title Pt will be able to: perform standing activities for > 20 minutes & prostheses with no pain or discomfort.  (06/17/14)   Time 2   Period Months   Status On-going   PT LONG TERM GOAL #5   Title Berg Balance >/= 45/56 (Target Date: 06/17/14)   Time 2   Period Months   Status On-going   PT LONG TERM GOAL #6   Title Pt will be able to: self-report via FOTO improvement >10points in Functional Status Measure  (06/17/14)   Time 2   Period Months   Status On-going          Plan - 04/27/14 1626    Clinical Impression Statement Pt with increased overall gait distance today and improved balance with single crutch. Progressing well towards goals.   Pt will benefit from skilled therapeutic intervention in order to improve on the following deficits Abnormal gait;Decreased activity tolerance;Decreased balance;Decreased knowledge of use of DME;Decreased mobility;Decreased  strength;Decreased endurance;Difficulty walking  prosthetic dependency   Rehab Potential Good   PT Frequency 2x / week   PT Duration 8 weeks   PT Treatment/Interventions ADLs/Self Care Home Management;DME Instruction;Gait training;Stair training;Functional mobility training;Therapeutic activities;Therapeutic exercise;Balance training;Neuromuscular re-education;Patient/family education;Other (comment)  prosthetic training   PT Next Visit Plan Progress gait training with 1 crutch, work on dynamic standing balance and taking small steps with 1 UE and no UE support.   Consulted and Agree with Plan of Care Patient           Problem List Patient Active Problem List   Diagnosis Date Noted  . Abnormal cardiovascular stress test 12/29/2013  . Awaiting organ transplant 12/09/2013  . Leg ulcer 11/17/2013  . Chronic kidney disease (CKD), stage V 11/17/2013  . Diabetes 11/17/2013  . Cancer of duodenum 11/17/2013  . Dyslipidemia 11/17/2013  . Chronic kidney disease requiring chronic dialysis 11/17/2013  . Hypercholesteremia 11/17/2013  . H/O malignant neoplasm 11/17/2013  . H/O: HTN (hypertension) 11/17/2013  . BP (high blood pressure) 11/17/2013  . Hypotension 11/17/2013  . Angiopathy, peripheral 11/17/2013  . AF (paroxysmal atrial fibrillation) 11/17/2013  . BKA stump complication 09/03/2013  . S/P bilateral BKA (below knee amputation) 08/03/2013  . S/P BKA (below knee amputation) bilateral 07/30/2013  . Open wound of both legs with complication 07/30/2013  . Acute blood loss anemia 07/12/2013  . Syncope 07/09/2013  . Intolerance to CAPD peritoneal dialysis s/p CAPD cath removal 07/09/2013 07/05/2013  . Critical lower limb ischemia 06/30/2013  . Extremity pain 06/30/2013  . Atherosclerosis of native arteries  of the extremities with ulceration(440.23) 06/17/2013  . Gait disorder 05/31/2013  . Weakness 05/24/2013  . Depression 04/03/2013  . GERD (gastroesophageal reflux disease)  04/03/2013  . Atrial fibrillation 04/02/2013  . DM (diabetes mellitus) 12/28/2012  . Other complications due to renal dialysis device, implant, and graft 05/19/2012  . Diabetes mellitus, type 2 11/13/2011  . Anemia 07/02/2011  . ESRD on hemodialysis 05/29/2011    Willow Ora 04/27/2014, 4:29 PM  Willow Ora, PTA, [place] [address], [place],  [address] [phone] 04/27/2014, 4:30 PM

## 2014-04-28 ENCOUNTER — Encounter (HOSPITAL_COMMUNITY): Payer: Self-pay | Admitting: Vascular Surgery

## 2014-04-29 ENCOUNTER — Encounter: Payer: Self-pay | Admitting: Physical Therapy

## 2014-04-29 ENCOUNTER — Ambulatory Visit: Payer: Managed Care, Other (non HMO) | Admitting: Physical Therapy

## 2014-04-29 DIAGNOSIS — R269 Unspecified abnormalities of gait and mobility: Secondary | ICD-10-CM

## 2014-04-29 DIAGNOSIS — Z89512 Acquired absence of left leg below knee: Secondary | ICD-10-CM

## 2014-04-29 DIAGNOSIS — R5381 Other malaise: Secondary | ICD-10-CM

## 2014-04-29 DIAGNOSIS — Z89511 Acquired absence of right leg below knee: Secondary | ICD-10-CM

## 2014-04-29 NOTE — Therapy (Signed)
[facility] [address] [place], [place], [address] Phone: [phone]   Fax:  [phone]  Physical Therapy Treatment  Patient Details  Name: Paul [name] MRN: [id number] Date of Birth: [birth year]  Encounter Date: 04/29/2014      PT End of Session - 04/29/14 0847    Visit Number 25   Number of Visits 40   Date for PT Re-Evaluation 06/17/14   Authorization Type [employer]   Authorization - Visit Number 25   Authorization - Number of Visits 60  combined   PT Start Time 0804   PT Stop Time 0845   PT Time Calculation (min) 41 min   Equipment Utilized During Treatment Gait belt   Activity Tolerance Patient tolerated treatment well   Behavior During Therapy [facility] for tasks assessed/performed      Past Medical History  Diagnosis Date  . Open wound of both legs with complication     Pt has had progressive wounds of both LE's including gangrene of the toes and patchy necrosis of the calves.  He has been treated at [facility] by Dr Jerline Pain with hyperbaric O2 5d per week.  He is getting Na Thiosulfate with HD for suspected calciphylaxis. Pt says doctor's aren't sure if these ulcers were diabetic ulcers or calciphylaxis.  He underwent bilat BKA on 07/30/13.  He says that the woun  . ESRD on hemodialysis     Pt has ESRD due to DM.  He had a L upper arm AVF prior to starting HD which was ligated due to L arm steal syndrome.  He started HD in Jan 2014 and did home HD.  The family couldn't cannulate the R arm AVF successfully so by mid 2014 they decided to switch to PD which was done late summer 2014.  In Jan 2015 he was admitted with FTT felt to be due to underdialysis on PD and PD was abandoned and he   . Anemia     esrd  . Hypertension     off of meds due to orthostatic hypotension  . Closed left arm fracture 1967  . Heart murmur   . Corneal abrasion, left   . GERD (gastroesophageal reflux disease)   . History of blood transfusion   .  Glomerulosclerosis, diabetic   . Depression   . A-fib   . Calciphylaxis 05/2013    lower ext  . Diabetes mellitus     Type 1 iddm x 11 yrs  . Diabetic neuropathy   . Cancer     hx duodenal adenoCa 2002, resected    Past Surgical History  Procedure Laterality Date  . Whipple procedure  2002    duodenal ca, Dr. Harlow Asa  . Bone marrow biopsy  2012  . Renal biopsy, percutaneous  2012  . Tonsillectomy    . Portacath placement  2002  . Biliary diversion, external  2002  . Other surgical history      Cyst removed from back  . Av fistula placement  06/07/2011    Procedure: ARTERIOVENOUS (AV) FISTULA CREATION;  Surgeon: Angelia Mould, MD;  Location: [place];  Service: Vascular;  Laterality: Left;  . Ligation of arteriovenous  fistula  05/22/2012    Procedure: LIGATION OF ARTERIOVENOUS  FISTULA;  Surgeon: Mal Misty, MD;  Location: [place];  Service: Vascular;  Laterality: Left;  Left brachio-cephalic fistula  . Insertion of dialysis catheter  05/22/2012    Procedure: INSERTION OF DIALYSIS CATHETER;  Surgeon: Mal Misty,  MD;  [facility]: [facility];  Service: Vascular;  Laterality: N/A;  right internal jugular vein  . Av fistula placement  06/18/2012    Procedure: ARTERIOVENOUS (AV) FISTULA CREATION;  Surgeon: Mal Misty, MD;  Location: [place];  Service: Vascular;  Laterality: Right;  [place]  . Colonoscopy      Hx: of  . Capd insertion N/A 01/14/2013    Procedure: LAPAROSCOPIC INSERTION CONTINUOUS AMBULATORY PERITONEAL DIALYSIS  (CAPD) CATHETER;  Surgeon: Adin Hector, MD;  Location: [place];  Service: General;  Laterality: N/A;  . Vascular surgery    . Removal of a dialysis catheter    . Capd removal N/A 07/09/2013    Procedure: CONTINUOUS AMBULATORY PERITONEAL DIALYSIS  (CAPD) CATHETER REMOVAL;  Surgeon: Adin Hector, MD;  Location: [place];  Service: General;  Laterality: N/A;  . Amputation Bilateral 07/30/2013    Procedure: AMPUTATION BELOW KNEE;  Surgeon: Newt Minion, MD;   Location: [place];  Service: Orthopedics;  Laterality: Bilateral;  Bilateral Below Knee Amputations  . Amputation Bilateral 09/03/2013    Procedure: AMPUTATION BELOW KNEE;  Surgeon: Newt Minion, MD;  Location: [place];  Service: Orthopedics;  Laterality: Bilateral;  Bilateral Below Knee Amputation Revision  . Lower extremity angiogram N/A 06/14/2013    Procedure: LOWER EXTREMITY ANGIOGRAM;  Surgeon: Angelia Mould, MD;  Location: [facility] CATH LAB;  Service: Cardiovascular;  Laterality: N/A;    There were no vitals taken for this visit.  Visit Diagnosis:  Abnormality of gait  Debility  S/P bilateral BKA (below knee amputation)      Subjective Assessment - 04/29/14 0808    Symptoms No new complaints. No pain or falls   Currently in Pain? No/denies            [facility] Adult PT Treatment/Exercise - 04/29/14 0820    Transfers   Sit to Stand 5: Supervision;From chair/3-in-1;With upper extremity assist   Sit to Stand Details (indicate cue type and reason) no cues or assist needed   Stand to Sit 5: Supervision;To chair/3-in-1;With upper extremity assist   Stand to Sit Details no cues or assist needed   Ambulation/Gait   Ambulation/Gait Yes   Ambulation/Gait Assistance 5: Supervision   Ambulation/Gait Assistance Details used bil crutches with first gait trial and then single crutch with remainder of session. Pt more stable today with crutch in right hand (vs left hand as with previous session). Pt did report his left knee felt weaker today with gait and may have contributed to this. Cues on posture and sequence crutch placement with single crutch use.                Ambulation Distance (Feet) 500 Feet  x1, 120 feet x 2 reps   Assistive device Lofstrands   Gait Pattern Step-through pattern;Decreased stride length;Wide base of support   Gait velocity decreased   Dynamic Standing Balance   Dynamic Standing - Balance Support No upper extremity supported;During functional activity   Dynamic  Standing - Level of Assistance 4: Min assist;3: Mod assist   Dynamic Standing - Balance Activities Rocker board;Head turns;Head nods;Eyes open;Eyes closed   Dynamic Standing - Comments both ways on rocker board: static hold for 1 minute with eyes open, hold for 10-15 seconds with eyes closed, head movement left/right and up/down with eyes open. All of these with only intermittent UE support to catch balance and min assist for balance.  Prosthetics   Current prosthetic wear tolerance (days/week)  7   Current prosthetic wear tolerance (#hours/day)  >90% of awake hours   Edema none   Residual limb condition  intact per pt/spouse            PT Short Term Goals - 04/29/14 1229    PT SHORT TERM GOAL #1   Baseline MET 04/20/14   PT SHORT TERM GOAL #6   Title Patient will ambulate house hold distance of 2f with 1 crutch while carrying a cup of water with supervision of PT (Target Date: 05/18/14)   PT SHORT TERM GOAL #7   Title Patient will improve BERG balance > 36/56 (Target Date: 05/18/14)   Time 4   Period Weeks   Status New   PT SHORT TERM GOAL #8   Title ambulates 700' with forearm crutches modified independent. (Target Date: 05/18/14)   Time 4   Period Weeks   Status New   PT SHORT TERM GOAL #9   TITLE negotiate ramp & curb with forearm crutches & bilateral prostheses with supervision. (Target Date: 05/18/14)   Time 4   Period Weeks   Status New          PT Long Term Goals - 04/29/14 1229    PT LONG TERM GOAL #1   Title Pt will be able to: Ambulate 8058fwith LRAD & prostheses modified independent. (06/17/14)   Time 2   Period Months   Status On-going   PT LONG TERM GOAL #2   Title Pt will be able to: ambulate 200' on uneven (grass) surfaces with LRAD & prostheses modified independent.  (06/17/14)   Time 2   Period Months   Status On-going   PT LONG TERM GOAL #3   Title Pt will be able to: negotiate ramp, curb, stairs with LRAD & prostheses  modified independent.  (06/17/14)   Time 2   Period Months   Status On-going   PT LONG TERM GOAL #4   Title Pt will be able to: perform standing activities for > 20 minutes & prostheses with no pain or discomfort.  (06/17/14)   Time 2   Period Months   Status On-going   PT LONG TERM GOAL #5   Title Berg Balance >/= 45/56 (Target Date: 06/17/14)   Time 2   Period Months   Status On-going   PT LONG TERM GOAL #6   Title Pt will be able to: self-report via FOTO improvement >10points in Functional Status Measure  (06/17/14)   Time 2   Period Months   Status On-going          Plan - 04/29/14 1227    Clinical Impression Statement Pt making steady progress toward goals with mobility. Pt with question on when he can get taller prosthetics and should wait until he is walking without AD or go for it now. Deferred this to primary PT on next visit to address.                  Pt will benefit from skilled therapeutic intervention in order to improve on the following deficits Abnormal gait;Decreased activity tolerance;Decreased balance;Decreased knowledge of use of DME;Decreased mobility;Decreased strength;Decreased endurance;Difficulty walking  prosthetic dependency   Rehab Potential Good   PT Frequency 2x / week   PT Duration 8 weeks   PT Treatment/Interventions ADLs/Self Care Home Management;DME Instruction;Gait training;Stair training;Functional mobility training;Therapeutic activities;Therapeutic exercise;Balance training;Neuromuscular re-education;Patient/family education;Other (comment)  prosthetic training   PT Next Visit  Plan Progress gait training with 1 crutch, work on dynamic standing balance and taking small steps with 1 UE and no UE support.   Consulted and Agree with Plan of Care Patient        Problem List Patient Active Problem List   Diagnosis Date Noted  . Abnormal cardiovascular stress test 12/29/2013  . Awaiting organ transplant 12/09/2013  . Leg ulcer 11/17/2013  .  Chronic kidney disease (CKD), stage V 11/17/2013  . Diabetes 11/17/2013  . Cancer of duodenum 11/17/2013  . Dyslipidemia 11/17/2013  . Chronic kidney disease requiring chronic dialysis 11/17/2013  . Hypercholesteremia 11/17/2013  . H/O malignant neoplasm 11/17/2013  . H/O: HTN (hypertension) 11/17/2013  . BP (high blood pressure) 11/17/2013  . Hypotension 11/17/2013  . Angiopathy, peripheral 11/17/2013  . AF (paroxysmal atrial fibrillation) 11/17/2013  . BKA stump complication 68/61/6837  . S/P bilateral BKA (below knee amputation) 08/03/2013  . S/P BKA (below knee amputation) bilateral 07/30/2013  . Open wound of both legs with complication 29/06/1113  . Acute blood loss anemia 07/12/2013  . Syncope 07/09/2013  . Intolerance to CAPD peritoneal dialysis s/p CAPD cath removal 07/09/2013 07/05/2013  . Critical lower limb ischemia 06/30/2013  . Extremity pain 06/30/2013  . Atherosclerosis of native arteries of the extremities with ulceration(440.23) 06/17/2013  . Gait disorder 05/31/2013  . Weakness 05/24/2013  . Depression 04/03/2013  . GERD (gastroesophageal reflux disease) 04/03/2013  . Atrial fibrillation 04/02/2013  . DM (diabetes mellitus) 12/28/2012  . Other complications due to renal dialysis device, implant, and graft 05/19/2012  . Diabetes mellitus, type 2 11/13/2011  . Anemia 07/02/2011  . ESRD on hemodialysis 05/29/2011    Willow Ora 04/29/2014, 12:30 PM  Willow Ora, PTA, [place] [address], [place], [place] [address] [phone] 04/29/2014, 12:31 PM

## 2014-05-02 ENCOUNTER — Ambulatory Visit: Payer: Managed Care, Other (non HMO) | Admitting: Physical Therapy

## 2014-05-02 ENCOUNTER — Encounter: Payer: Self-pay | Admitting: Physical Therapy

## 2014-05-02 DIAGNOSIS — R6889 Other general symptoms and signs: Secondary | ICD-10-CM

## 2014-05-02 DIAGNOSIS — Z89511 Acquired absence of right leg below knee: Secondary | ICD-10-CM

## 2014-05-02 DIAGNOSIS — R269 Unspecified abnormalities of gait and mobility: Secondary | ICD-10-CM

## 2014-05-02 DIAGNOSIS — Z89512 Acquired absence of left leg below knee: Secondary | ICD-10-CM

## 2014-05-02 NOTE — Therapy (Signed)
[facility] [address] [place], [place], [address] Phone: [phone]   Fax:  [phone]  Physical Therapy Treatment  Patient Details  Name: Paul [name] MRN: [id number] Date of Birth: Jun 25, 1955  Encounter Date: 05/02/2014      PT End of Session - 05/02/14 1120    Visit Number 26   Number of Visits 40   Date for PT Re-Evaluation 06/17/14   Authorization Type [employer]   Authorization - Visit Number 54   Authorization - Number of Visits 60   PT Start Time 0800   PT Stop Time 0845   PT Time Calculation (min) 45 min   Equipment Utilized During Treatment Gait belt   Activity Tolerance Patient tolerated treatment well   Behavior During Therapy [facility] for tasks assessed/performed      Past Medical History  Diagnosis Date  . Open wound of both legs with complication     Pt has had progressive wounds of both LE's including gangrene of the toes and patchy necrosis of the calves.  He has been treated at [facility] by Dr Jerline Pain with hyperbaric O2 5d per week.  He is getting Na Thiosulfate with HD for suspected calciphylaxis. Pt says doctor's aren't sure if these ulcers were diabetic ulcers or calciphylaxis.  He underwent bilat BKA on 07/30/13.  He says that the woun  . ESRD on hemodialysis     Pt has ESRD due to DM.  He had a L upper arm AVF prior to starting HD which was ligated due to L arm steal syndrome.  He started HD in Jan 2014 and did home HD.  The family couldn't cannulate the R arm AVF successfully so by mid 2014 they decided to switch to PD which was done late summer 2014.  In Jan 2015 he was admitted with FTT felt to be due to underdialysis on PD and PD was abandoned and he   . Anemia     esrd  . Hypertension     off of meds due to orthostatic hypotension  . Closed left arm fracture 1967  . Heart murmur   . Corneal abrasion, left   . GERD (gastroesophageal reflux disease)   . History of blood transfusion   .  Glomerulosclerosis, diabetic   . Depression   . A-fib   . Calciphylaxis 05/2013    lower ext  . Diabetes mellitus     Type 1 iddm x 11 yrs  . Diabetic neuropathy   . Cancer     hx duodenal adenoCa 2002, resected    Past Surgical History  Procedure Laterality Date  . Whipple procedure  2002    duodenal ca, Dr. Harlow Asa  . Bone marrow biopsy  2012  . Renal biopsy, percutaneous  2012  . Tonsillectomy    . Portacath placement  2002  . Biliary diversion, external  2002  . Other surgical history      Cyst removed from back  . Av fistula placement  06/07/2011    Procedure: ARTERIOVENOUS (AV) FISTULA CREATION;  Surgeon: Angelia Mould, MD;  Location: [place];  Service: Vascular;  Laterality: Left;  . Ligation of arteriovenous  fistula  05/22/2012    Procedure: LIGATION OF ARTERIOVENOUS  FISTULA;  Surgeon: Mal Misty, MD;  Location: [place];  Service: Vascular;  Laterality: Left;  Left brachio-cephalic fistula  . Insertion of dialysis catheter  05/22/2012    Procedure: INSERTION OF DIALYSIS CATHETER;  Surgeon: Mal Misty, MD;  [facility];  Service: Vascular;  Laterality: N/A;  right internal jugular vein  . Av fistula placement  06/18/2012    Procedure: ARTERIOVENOUS (AV) FISTULA CREATION;  Surgeon: Mal Misty, MD;  Location: [place];  Service: Vascular;  Laterality: Right;  Person  . Colonoscopy      Hx: of  . Capd insertion N/A 01/14/2013    Procedure: LAPAROSCOPIC INSERTION CONTINUOUS AMBULATORY PERITONEAL DIALYSIS  (CAPD) CATHETER;  Surgeon: Adin Hector, MD;  Location: [place];  Service: General;  Laterality: N/A;  . Vascular surgery    . Removal of a dialysis catheter    . Capd removal N/A 07/09/2013    Procedure: CONTINUOUS AMBULATORY PERITONEAL DIALYSIS  (CAPD) CATHETER REMOVAL;  Surgeon: Adin Hector, MD;  Location: [place];  Service: General;  Laterality: N/A;  . Amputation Bilateral 07/30/2013    Procedure: AMPUTATION BELOW KNEE;  Surgeon: Newt Minion, MD;   Location: [place];  Service: Orthopedics;  Laterality: Bilateral;  Bilateral Below Knee Amputations  . Amputation Bilateral 09/03/2013    Procedure: AMPUTATION BELOW KNEE;  Surgeon: Newt Minion, MD;  Location: [place];  Service: Orthopedics;  Laterality: Bilateral;  Bilateral Below Knee Amputation Revision  . Lower extremity angiogram N/A 06/14/2013    Procedure: LOWER EXTREMITY ANGIOGRAM;  Surgeon: Angelia Mould, MD;  Location: [facility] CATH LAB;  Service: Cardiovascular;  Laterality: N/A;    There were no vitals taken for this visit.  Visit Diagnosis:  Abnormality of gait  S/P bilateral BKA (below knee amputation)  Activity intolerance      Subjective Assessment - 05/02/14 0802    Symptoms Feels back occassionally with getting in / out of bed   Currently in Pain? No/denies            [facility] Adult PT Treatment/Exercise - 05/02/14 0800    Bed Mobility   Bed Mobility Supine to Sit;Sit to Supine;Sit to Sidelying Right;Sit to Sidelying Left  PT demo technique to decrease back strain   Transfers   Sit to Stand With upper extremity assist;From chair/3-in-1  No armrests on chair   Sit to Stand Details (indicate cue type and reason) cues for stabilization without UE support    Stand to Sit 5: Supervision;With upper extremity assist  no armrests on chair   Stand to Sit Details cues on technique   Ambulation/Gait   Ambulation/Gait Yes   Ambulation/Gait Assistance 4: Min guard;4: Min assist;5: Supervision  2 crutches SBA, 1 crutch min gaurd, no device min A   Ambulation/Gait Assistance Details cues on posture & wt shift   Ambulation Distance (Feet) 500 Feet  2 crutches, 125' X 3 1 crutch, 25' X 2 no device   Assistive device Lofstrands;None  Bilateral BKA prostheses   Gait Pattern Step-through pattern;Decreased stride length;Wide base of support   Gait velocity decreased   Stairs Yes   Stairs Assistance 5: Supervision   Stairs Assistance Details (indicate cue type and reason)  verbal & demo reciprocal pattern technique   Stair Management Technique One rail Left;One rail Right;Two rails;Alternating pattern;Step to pattern;Forwards;With crutches  1 rail/1 crutch step-to, 2 rails alternating   Number of Stairs 16   Height of Stairs 6   Door Management 5: Supervision   Door Managment Details (indicate cue type and reason) 2 crutches   Ramp 5: Supervision   Ramp Details (indicate cue type and reason) 2 crutches   Curb 5: Supervision   Curb Details (indicate cue type and reason) 2  crutches   High Level Balance   High Level Balance Activities Side stepping;Backward walking  1 crutch   High Level Balance Comments manual /tactile & verbal cues on wt shift   Prosthetics   Current prosthetic wear tolerance (days/week)  7 days/wk   Current prosthetic wear tolerance (#hours/day)  >90% of awake hours   Edema none   Residual limb condition  intact per pt/spouse   Donning Prosthesis Modified independent (device/increased time)   Doffing Prosthesis Modified independent (device/increased time)          PT Education - 05/02/14 0800    Education provided Yes   Education Details stairs with 2 rails reciprocally, safety with less restrictive device and when to lengthen prostheses to his pre-amputation ht.   Person(s) Educated Patient   Methods Explanation   Comprehension Verbalized understanding          PT Short Term Goals - 05/02/14 0800    PT SHORT TERM GOAL #1   Baseline MET 04/20/14   PT SHORT TERM GOAL #6   Title Patient will ambulate house hold distance of 59ft with 1 crutch while carrying a cup of water with supervision of PT (Target Date: 05/18/14)   PT SHORT TERM GOAL #7   Title Patient will improve BERG balance > 36/56 (Target Date: 05/18/14)   Time 4   Period Weeks   Status New   PT SHORT TERM GOAL #8   Title ambulates 700' with forearm crutches modified independent. (Target Date: 05/18/14)   Time 4   Period Weeks   Status New   PT SHORT TERM GOAL  #9   TITLE negotiate ramp & curb with forearm crutches & bilateral prostheses with supervision. (Target Date: 05/18/14)   Time 4   Period Weeks   Status New          PT Long Term Goals - 05/02/14 0800    PT LONG TERM GOAL #1   Title Pt will be able to: Ambulate 839ft with LRAD & prostheses modified independent. (06/17/14)   Time 2   Period Months   Status On-going   PT LONG TERM GOAL #2   Title Pt will be able to: ambulate 200' on uneven (grass) surfaces with LRAD & prostheses modified independent.  (06/17/14)   Time 2   Period Months   Status On-going   PT LONG TERM GOAL #3   Title Pt will be able to: negotiate ramp, curb, stairs with LRAD & prostheses modified independent.  (06/17/14)   Time 2   Period Months   Status On-going   PT LONG TERM GOAL #4   Title Pt will be able to: perform standing activities for > 20 minutes & prostheses with no pain or discomfort.  (06/17/14)   Time 2   Period Months   Status On-going   PT LONG TERM GOAL #5   Title Berg Balance >/= 45/56 (Target Date: 06/17/14)   Time 2   Period Months   Status On-going   PT LONG TERM GOAL #6   Title Pt will be able to: self-report via FOTO improvement >10points in Functional Status Measure  (06/17/14)   Time 2   Period Months   Status On-going          Plan - 05/02/14 1121    Clinical Impression Statement Patient was able to ambulate short distance without assistive device with minimal assist. He is steadly improving endurance & activity tolerance.   Pt will benefit from skilled therapeutic intervention  in order to improve on the following deficits Abnormal gait;Decreased activity tolerance;Decreased balance;Decreased knowledge of use of DME;Decreased mobility;Decreased strength;Decreased endurance;Difficulty walking   Rehab Potential Good   PT Frequency 2x / week   PT Duration 8 weeks   PT Treatment/Interventions ADLs/Self Care Home Management;DME Instruction;Gait training;Stair training;Functional  mobility training;Therapeutic activities;Therapeutic exercise;Balance training;Neuromuscular re-education;Patient/family education;Other (comment)  prosthetic training   PT Next Visit Plan gait with 1 crutch & without device.    Consulted and Agree with Plan of Care Patient;Family member/caregiver   Family Member Consulted wife   PT Plan Continue outdoor ambulation and curb training with forearm crutches.           Problem List Patient Active Problem List   Diagnosis Date Noted  . Abnormal cardiovascular stress test 12/29/2013  . Awaiting organ transplant 12/09/2013  . Leg ulcer 11/17/2013  . Chronic kidney disease (CKD), stage V 11/17/2013  . Diabetes 11/17/2013  . Cancer of duodenum 11/17/2013  . Dyslipidemia 11/17/2013  . Chronic kidney disease requiring chronic dialysis 11/17/2013  . Hypercholesteremia 11/17/2013  . H/O malignant neoplasm 11/17/2013  . H/O: HTN (hypertension) 11/17/2013  . BP (high blood pressure) 11/17/2013  . Hypotension 11/17/2013  . Angiopathy, peripheral 11/17/2013  . AF (paroxysmal atrial fibrillation) 11/17/2013  . BKA stump complication 67/70/3403  . S/P bilateral BKA (below knee amputation) 08/03/2013  . S/P BKA (below knee amputation) bilateral 07/30/2013  . Open wound of both legs with complication 52/48/1859  . Acute blood loss anemia 07/12/2013  . Syncope 07/09/2013  . Intolerance to CAPD peritoneal dialysis s/p CAPD cath removal 07/09/2013 07/05/2013  . Critical lower limb ischemia 06/30/2013  . Extremity pain 06/30/2013  . Atherosclerosis of native arteries of the extremities with ulceration(440.23) 06/17/2013  . Gait disorder 05/31/2013  . Weakness 05/24/2013  . Depression 04/03/2013  . GERD (gastroesophageal reflux disease) 04/03/2013  . Atrial fibrillation 04/02/2013  . DM (diabetes mellitus) 12/28/2012  . Other complications due to renal dialysis device, implant, and graft 05/19/2012  . Diabetes mellitus, type 2 11/13/2011  .  Anemia 07/02/2011  . ESRD on hemodialysis 05/29/2011    Quintavius Niebuhr 05/02/2014, 11:25 AM    Jamey Reas, PT, DPT PT Specializing in [place] 05/02/2014 11:27 AM Phone:  [phone]  Fax:  [phone] [place] [address] [place], [place] [address]

## 2014-05-04 ENCOUNTER — Ambulatory Visit: Payer: Managed Care, Other (non HMO) | Admitting: Physical Therapy

## 2014-05-04 ENCOUNTER — Encounter: Payer: Self-pay | Admitting: Physical Therapy

## 2014-05-04 DIAGNOSIS — R269 Unspecified abnormalities of gait and mobility: Secondary | ICD-10-CM

## 2014-05-04 DIAGNOSIS — Z89512 Acquired absence of left leg below knee: Secondary | ICD-10-CM

## 2014-05-04 DIAGNOSIS — R6889 Other general symptoms and signs: Secondary | ICD-10-CM

## 2014-05-04 DIAGNOSIS — Z89511 Acquired absence of right leg below knee: Secondary | ICD-10-CM

## 2014-05-04 DIAGNOSIS — R5381 Other malaise: Secondary | ICD-10-CM

## 2014-05-05 NOTE — Therapy (Signed)
[facility] [address] [place], [place], [address] Phone: [phone]   Fax:  [phone]  Physical Therapy Treatment  Patient Details  Name: Paul [name] MRN: [id number] Date of Birth: 07-09-1955  Encounter Date: 05/04/2014      PT End of Session - 05/04/14 0845    Visit Number 27   Number of Visits 40   Date for PT Re-Evaluation 06/17/14   Authorization Type [employer]   Authorization - Visit Number 83   Authorization - Number of Visits 60   PT Start Time 0805   PT Stop Time 0845   PT Time Calculation (min) 40 min   Equipment Utilized During Treatment Gait belt   Activity Tolerance Patient tolerated treatment well   Behavior During Therapy [facility] for tasks assessed/performed      Past Medical History  Diagnosis Date  . Open wound of both legs with complication     Pt has had progressive wounds of both LE's including gangrene of the toes and patchy necrosis of the calves.  He has been treated at [facility] by Dr Jerline Pain with hyperbaric O2 5d per week.  He is getting Na Thiosulfate with HD for suspected calciphylaxis. Pt says doctor's aren't sure if these ulcers were diabetic ulcers or calciphylaxis.  He underwent bilat BKA on 07/30/13.  He says that the woun  . ESRD on hemodialysis     Pt has ESRD due to DM.  He had a L upper arm AVF prior to starting HD which was ligated due to L arm steal syndrome.  He started HD in Jan 2014 and did home HD.  The family couldn't cannulate the R arm AVF successfully so by mid 2014 they decided to switch to PD which was done late summer 2014.  In Jan 2015 he was admitted with FTT felt to be due to underdialysis on PD and PD was abandoned and he   . Anemia     esrd  . Hypertension     off of meds due to orthostatic hypotension  . Closed left arm fracture 1967  . Heart murmur   . Corneal abrasion, left   . GERD (gastroesophageal reflux disease)   . History of blood transfusion   .  Glomerulosclerosis, diabetic   . Depression   . A-fib   . Calciphylaxis 05/2013    lower ext  . Diabetes mellitus     Type 1 iddm x 11 yrs  . Diabetic neuropathy   . Cancer     hx duodenal adenoCa 2002, resected    Past Surgical History  Procedure Laterality Date  . Whipple procedure  2002    duodenal ca, Dr. Harlow Asa  . Bone marrow biopsy  2012  . Renal biopsy, percutaneous  2012  . Tonsillectomy    . Portacath placement  2002  . Biliary diversion, external  2002  . Other surgical history      Cyst removed from back  . Av fistula placement  06/07/2011    Procedure: ARTERIOVENOUS (AV) FISTULA CREATION;  Surgeon: Angelia Mould, MD;  Location: [place];  Service: Vascular;  Laterality: Left;  . Ligation of arteriovenous  fistula  05/22/2012    Procedure: LIGATION OF ARTERIOVENOUS  FISTULA;  Surgeon: Mal Misty, MD;  Location: [place];  Service: Vascular;  Laterality: Left;  Left brachio-cephalic fistula  . Insertion of dialysis catheter  05/22/2012    Procedure: INSERTION OF DIALYSIS CATHETER;  Surgeon: Mal Misty, MD;  [facility]: [facility];  Service: Vascular;  Laterality: N/A;  right internal jugular vein  . Av fistula placement  06/18/2012    Procedure: ARTERIOVENOUS (AV) FISTULA CREATION;  Surgeon: Mal Misty, MD;  Location: [place];  Service: Vascular;  Laterality: Right;  [place]  . Colonoscopy      Hx: of  . Capd insertion N/A 01/14/2013    Procedure: LAPAROSCOPIC INSERTION CONTINUOUS AMBULATORY PERITONEAL DIALYSIS  (CAPD) CATHETER;  Surgeon: Adin Hector, MD;  Location: [place];  Service: General;  Laterality: N/A;  . Vascular surgery    . Removal of a dialysis catheter    . Capd removal N/A 07/09/2013    Procedure: CONTINUOUS AMBULATORY PERITONEAL DIALYSIS  (CAPD) CATHETER REMOVAL;  Surgeon: Adin Hector, MD;  Location: [place];  Service: General;  Laterality: N/A;  . Amputation Bilateral 07/30/2013    Procedure: AMPUTATION BELOW KNEE;  Surgeon: Newt Minion, MD;   Location: [place];  Service: Orthopedics;  Laterality: Bilateral;  Bilateral Below Knee Amputations  . Amputation Bilateral 09/03/2013    Procedure: AMPUTATION BELOW KNEE;  Surgeon: Newt Minion, MD;  Location: [place];  Service: Orthopedics;  Laterality: Bilateral;  Bilateral Below Knee Amputation Revision  . Lower extremity angiogram N/A 06/14/2013    Procedure: LOWER EXTREMITY ANGIOGRAM;  Surgeon: Angelia Mould, MD;  Location: [facility] CATH LAB;  Service: Cardiovascular;  Laterality: N/A;    There were no vitals taken for this visit.  Visit Diagnosis:  Abnormality of gait  Activity intolerance  S/P bilateral BKA (below knee amputation)  Debility      Subjective Assessment - 05/04/14 0811    Symptoms No new complaints. Reports no pain or falls.    Currently in Pain? No/denies   Pain Score 0-No pain            [facility] Adult PT Treatment/Exercise - 05/04/14 6962    Transfers   Sit to Stand 5: Supervision;With upper extremity assist;From chair/3-in-1   Stand to Sit 5: Supervision;With upper extremity assist;To chair/3-in-1   Ambulation/Gait   Ambulation/Gait Yes   Ambulation/Gait Assistance 4: Min guard;4: Min assist   Ambulation/Gait Assistance Details cues on posture and weight shifting. In parallel bars focused on posture, equal step length, base of support and weight shifting with mirror feedback at each end   Ambulation Distance (Feet) 240 Feet  x 2 (rt side, lt side), plus multiple laps in parallel bars   Assistive device Lofstrands;None  single crutch   Gait Pattern Step-through pattern;Decreased stride length;Wide base of support   Gait velocity decreased   Prosthetics   Prosthetic Care Comments  Discussed dry skin he had yesterday and use of baby oil/lotion to help with this                        Current prosthetic wear tolerance (days/week)  7 days/wk   Current prosthetic wear tolerance (#hours/day)  >90% of awake hours   Edema none   Residual limb condition   intact per pt/spouse   Donning Prosthesis Modified independent (device/increased time)   Doffing Prosthesis Modified independent (device/increased time)            PT Short Term Goals - 05/04/14 0845    PT SHORT TERM GOAL #1   Baseline MET 04/20/14   PT SHORT TERM GOAL #6   Title Patient will ambulate house hold distance of 70f with 1 crutch while carrying a cup of water with supervision of PT (  Target Date: 05/18/14)   Status On-going   PT SHORT TERM GOAL #7   Title Patient will improve BERG balance > 36/56 (Target Date: 05/18/14)   Time 4   Period Weeks   Status On-going   PT SHORT TERM GOAL #8   Title ambulates 700' with forearm crutches modified independent. (Target Date: 05/18/14)   Time 4   Period Weeks   Status On-going   PT SHORT TERM GOAL #9   TITLE negotiate ramp & curb with forearm crutches & bilateral prostheses with supervision. (Target Date: 05/18/14)   Time 4   Period Weeks   Status On-going          PT Long Term Goals - 05/04/14 0845    PT LONG TERM GOAL #1   Title Pt will be able to: Ambulate 817f with LRAD & prostheses modified independent. (06/17/14)   Time 2   Period Months   Status On-going   PT LONG TERM GOAL #2   Title Pt will be able to: ambulate 200' on uneven (grass) surfaces with LRAD & prostheses modified independent.  (06/17/14)   Time 2   Period Months   Status On-going   PT LONG TERM GOAL #3   Title Pt will be able to: negotiate ramp, curb, stairs with LRAD & prostheses modified independent.  (06/17/14)   Time 2   Period Months   Status On-going   PT LONG TERM GOAL #4   Title Pt will be able to: perform standing activities for > 20 minutes & prostheses with no pain or discomfort.  (06/17/14)   Time 2   Period Months   Status On-going   PT LONG TERM GOAL #5   Title Berg Balance >/= 45/56 (Target Date: 06/17/14)   Time 2   Period Months   Status On-going   PT LONG TERM GOAL #6   Title Pt will be able to: self-report via FOTO  improvement >10points in Functional Status Measure  (06/17/14)   Time 2   Period Months   Status On-going          Plan - 05/04/14 0845    Clinical Impression Statement Pt making great progress toward goals. Pt was able to correct gait deviations and posture with mirror feedback with gait without AD today. No complaints after session   Pt will benefit from skilled therapeutic intervention in order to improve on the following deficits Abnormal gait;Decreased activity tolerance;Decreased balance;Decreased knowledge of use of DME;Decreased mobility;Decreased strength;Decreased endurance;Difficulty walking   Rehab Potential Good   PT Frequency 2x / week   PT Duration 8 weeks   PT Treatment/Interventions ADLs/Self Care Home Management;DME Instruction;Gait training;Stair training;Functional mobility training;Therapeutic activities;Therapeutic exercise;Balance training;Neuromuscular re-education;Patient/family education;Other (comment)  prosthetic training   PT Next Visit Plan gait with 1 crutch & without device., balance activities. Assess STG's.   Consulted and Agree with Plan of Care Patient;Family member/caregiver   Family Member Consulted wife   PT Plan Continue outdoor ambulation and curb training with forearm crutches.       Problem List Patient Active Problem List   Diagnosis Date Noted  . Abnormal cardiovascular stress test 12/29/2013  . Awaiting organ transplant 12/09/2013  . Leg ulcer 11/17/2013  . Chronic kidney disease (CKD), stage V 11/17/2013  . Diabetes 11/17/2013  . Cancer of duodenum 11/17/2013  . Dyslipidemia 11/17/2013  . Chronic kidney disease requiring chronic dialysis 11/17/2013  . Hypercholesteremia 11/17/2013  . H/O malignant neoplasm 11/17/2013  . H/O: HTN (hypertension) 11/17/2013  .  BP (high blood pressure) 11/17/2013  . Hypotension 11/17/2013  . Angiopathy, peripheral 11/17/2013  . AF (paroxysmal atrial fibrillation) 11/17/2013  . BKA stump complication  37/01/6437  . S/P bilateral BKA (below knee amputation) 08/03/2013  . S/P BKA (below knee amputation) bilateral 07/30/2013  . Open wound of both legs with complication 38/18/4037  . Acute blood loss anemia 07/12/2013  . Syncope 07/09/2013  . Intolerance to CAPD peritoneal dialysis s/p CAPD cath removal 07/09/2013 07/05/2013  . Critical lower limb ischemia 06/30/2013  . Extremity pain 06/30/2013  . Atherosclerosis of native arteries of the extremities with ulceration(440.23) 06/17/2013  . Gait disorder 05/31/2013  . Weakness 05/24/2013  . Depression 04/03/2013  . GERD (gastroesophageal reflux disease) 04/03/2013  . Atrial fibrillation 04/02/2013  . DM (diabetes mellitus) 12/28/2012  . Other complications due to renal dialysis device, implant, and graft 05/19/2012  . Diabetes mellitus, type 2 11/13/2011  . Anemia 07/02/2011  . ESRD on hemodialysis 05/29/2011    Willow Ora 05/05/2014, 9:10 AM

## 2014-05-06 ENCOUNTER — Ambulatory Visit (INDEPENDENT_AMBULATORY_CARE_PROVIDER_SITE_OTHER): Payer: Managed Care, Other (non HMO) | Admitting: Cardiology

## 2014-05-06 ENCOUNTER — Encounter: Payer: Self-pay | Admitting: Cardiology

## 2014-05-06 VITALS — BP 132/70 | HR 69 | Ht 72.0 in | Wt 177.2 lb

## 2014-05-06 DIAGNOSIS — Z992 Dependence on renal dialysis: Secondary | ICD-10-CM

## 2014-05-06 DIAGNOSIS — I480 Paroxysmal atrial fibrillation: Secondary | ICD-10-CM

## 2014-05-06 DIAGNOSIS — N186 End stage renal disease: Secondary | ICD-10-CM

## 2014-05-06 NOTE — Patient Instructions (Signed)
Continue same meds   Your physician wants you to follow-up in: 1 year. You will receive a reminder letter in the mail two months in advance. If you don't receive a letter, please call our office to schedule the follow-up appointment.

## 2014-05-07 NOTE — Progress Notes (Signed)
[place] [place], [place]  [address] Phone: [phone] Fax:  [phone]  Date:  05/07/2014   ID:  [name], Paul 07/20/1955, MRN [id number]  PCP:  [name], MD     History of Present Illness: Paul [name] is a 58 y.o. male seen for follow up of AFib. He has  a hx of AFib, DM, ESRD on peritoneal dialysis, duodenal CA, GERD, depression.  He has no reported CAD.  He has had several admissions this year with syncope 2/2 orthostasis related to dialysis as well as LE gangrene and calciphylaxis.  His Coumadin was stopped in 06/2013 due to concerns for coumadin necrosis.  He ultimately underwent bilateral BKAs. He is now on hemodialysis T/T/Sa. He is on the transplant list at [facility]. He underwent cardiac evaluation at [facility] with a pharmacologic stress Echo. A wall motion abnormality was seen. Subsequent Myoview study showed normal perfusion. EF was normal by Echo but 44% by nuclear study. The patient denies palpitations, chest pain, shortness of breath, syncope, orthopnea, PND or significant pedal edema. He has had no recurrent of Afib.    Studies:  - Echo (07/13/13):  EF 55-60%, normal motion, mild LAE  - Carotid US (07/11/13):  Bilateral ICA 1-39%.   Recent Labs: 05/24/2013: ALT 21 07/10/2013: TSH 2.188 08/14/2013: Creatinine 4.49* 09/03/2013: Hemoglobin 11.2*; Potassium 4.7  Wt Readings from Last 3 Encounters:  05/06/14 177 lb 3.2 oz (80.377 kg)  03/21/14 165 lb (74.844 kg)  01/19/14 160 lb (72.576 kg)     Past Medical History  Diagnosis Date  . Open wound of both legs with complication     Pt has had progressive wounds of both LE's including gangrene of the toes and patchy necrosis of the calves.  He has been treated at [facility] by Dr Jerline Pain with hyperbaric O2 5d per week.  He is getting Na Thiosulfate with HD for suspected calciphylaxis. Pt says doctor's aren't sure if these ulcers were diabetic ulcers or calciphylaxis.  He underwent bilat BKA on 07/30/13.  He  says that the woun  . ESRD on hemodialysis     Pt has ESRD due to DM.  He had a L upper arm AVF prior to starting HD which was ligated due to L arm steal syndrome.  He started HD in Jan 2014 and did home HD.  The family couldn't cannulate the R arm AVF successfully so by mid 2014 they decided to switch to PD which was done late summer 2014.  In Jan 2015 he was admitted with FTT felt to be due to underdialysis on PD and PD was abandoned and he   . Anemia     esrd  . Hypertension     off of meds due to orthostatic hypotension  . Closed left arm fracture 1967  . Heart murmur   . Corneal abrasion, left   . GERD (gastroesophageal reflux disease)   . History of blood transfusion   . Glomerulosclerosis, diabetic   . Depression   . A-fib   . Calciphylaxis 05/2013    lower ext  . Diabetes mellitus     Type 1 iddm x 11 yrs  . Diabetic neuropathy   . Cancer     hx duodenal adenoCa 2002, resected    Current Outpatient Prescriptions  Medication Sig Dispense Refill  . amLODipine (NORVASC) 10 MG tablet     . aspirin EC 325 MG EC tablet Take 1 tablet (325 mg total) by mouth daily.  30 tablet 0  . atorvastatin (LIPITOR) 10 MG tablet Take 1 tablet (10 mg total) by mouth daily. 30 tablet 1  . Darbepoetin Alfa-Polysorbate (ARANESP, ALBUMIN FREE, IJ) Inject as directed. WITH DIALYSIS    . insulin glargine (LANTUS) 100 UNIT/ML injection Inject 10 Units into the skin at bedtime.    . multivitamin (RENA-VIT) TABS tablet Take 1 tablet by mouth at bedtime. 30 tablet 1  . omeprazole (PRILOSEC) 40 MG capsule     . sevelamer carbonate (RENVELA) 800 MG tablet Take 800 mg by mouth 3 (three) times daily with meals.     No current facility-administered medications for this visit.    Allergies:   Metformin and related   Social History:  The patient  reports that he has never smoked. He has never used smokeless tobacco. He reports that he does not drink alcohol or use illicit drugs.   Family History:  The  patient's family history includes Cancer in his father and mother; Heart disease in his maternal grandfather; Stroke in his paternal grandmother.   ROS:  Please see the history of present illness.      All other systems reviewed and negative.   PHYSICAL EXAM: VS:  BP 132/70 mmHg  Pulse 69  Ht 6' (1.829 m)  Wt 177 lb 3.2 oz (80.377 kg)  BMI 24.03 kg/m2 Well nourished, well developed, in no acute distress HEENT: normal Neck: no JVDat 90 degrees Cardiac:  normal S1, S2; RRR; grade 1-2/6 SEM RUSB Lungs:  clear to auscultation bilaterally, no wheezing, rhonchi or rales Abd: soft, nontender, no hepatomegaly Ext:  Bilateral BKAs noted. Patient has bilateral prosthses. Skin: warm and dry Neuro:  CNs 2-12 intact, no focal abnormalities noted    ASSESSMENT AND PLAN:  1. Atrial Fibrillation:  Maintaining NSR. Apparently only had Afib during his hospitalization with surgery. No recurrence.   He is no longer on coumadin due to concerns for coumadin skin necrosis.  He did have a bx in the hospital that was positive for calciphylaxis.  He is on ASA.  He is not a candidate for a NOAC due to his ESRD.  CHADS2-VASc=3 (HTN, DM, vascular disease).  At this point I would not resume coumadin but if he has recurrence I think this would need to be reconsidered.  2. ESRD:  He is on dialysis Tu, Th, Sat. On transplant list at [facility]. 3. PAD s/p Bilateral BKA:  F/u with orthopedics and PM&R as planned.    4. Hyperlipidemia:  Continue statin.  Disposition: F/u in 6 months.

## 2014-05-09 ENCOUNTER — Encounter: Payer: Self-pay | Admitting: Physical Therapy

## 2014-05-09 ENCOUNTER — Ambulatory Visit: Payer: Managed Care, Other (non HMO) | Admitting: Physical Therapy

## 2014-05-09 DIAGNOSIS — Z89512 Acquired absence of left leg below knee: Secondary | ICD-10-CM

## 2014-05-09 DIAGNOSIS — R6889 Other general symptoms and signs: Secondary | ICD-10-CM

## 2014-05-09 DIAGNOSIS — R269 Unspecified abnormalities of gait and mobility: Secondary | ICD-10-CM | POA: Diagnosis not present

## 2014-05-09 DIAGNOSIS — Z89511 Acquired absence of right leg below knee: Secondary | ICD-10-CM

## 2014-05-09 NOTE — Therapy (Signed)
[facility] [address] [place], [place], [address] Phone: [phone]   Fax:  [phone]  Physical Therapy Treatment  Patient Details  Name: Paul [name] MRN: [id number] Date of Birth: [birth year]  Encounter Date: 05/09/2014      PT End of Session - 05/09/14 0800    Visit Number 28   Number of Visits 40   Date for PT Re-Evaluation 06/17/14   Authorization Type [employer]   Authorization - Visit Number 28   Authorization - Number of Visits 60   Equipment Utilized During Treatment Gait belt   Activity Tolerance Patient tolerated treatment well   Behavior During Therapy [facility] for tasks assessed/performed      Past Medical History  Diagnosis Date  . Open wound of both legs with complication     Pt has had progressive wounds of both LE's including gangrene of the toes and patchy necrosis of the calves.  He has been treated at [facility] by Dr Jerline Pain with hyperbaric O2 5d per week.  He is getting Na Thiosulfate with HD for suspected calciphylaxis. Pt says doctor's aren't sure if these ulcers were diabetic ulcers or calciphylaxis.  He underwent bilat BKA on 07/30/13.  He says that the woun  . ESRD on hemodialysis     Pt has ESRD due to DM.  He had a L upper arm AVF prior to starting HD which was ligated due to L arm steal syndrome.  He started HD in Jan 2014 and did home HD.  The family couldn't cannulate the R arm AVF successfully so by mid 2014 they decided to switch to PD which was done late summer 2014.  In Jan 2015 he was admitted with FTT felt to be due to underdialysis on PD and PD was abandoned and he   . Anemia     esrd  . Hypertension     off of meds due to orthostatic hypotension  . Closed left arm fracture 1967  . Heart murmur   . Corneal abrasion, left   . GERD (gastroesophageal reflux disease)   . History of blood transfusion   . Glomerulosclerosis, diabetic   . Depression   . A-fib   . Calciphylaxis 05/2013     lower ext  . Diabetes mellitus     Type 1 iddm x 11 yrs  . Diabetic neuropathy   . Cancer     hx duodenal adenoCa 2002, resected    Past Surgical History  Procedure Laterality Date  . Whipple procedure  2002    duodenal ca, Dr. Harlow Asa  . Bone marrow biopsy  2012  . Renal biopsy, percutaneous  2012  . Tonsillectomy    . Portacath placement  2002  . Biliary diversion, external  2002  . Other surgical history      Cyst removed from back  . Av fistula placement  06/07/2011    Procedure: ARTERIOVENOUS (AV) FISTULA CREATION;  Surgeon: Angelia Mould, MD;  Location: [facility];  Service: Vascular;  Laterality: Left;  . Ligation of arteriovenous  fistula  05/22/2012    Procedure: LIGATION OF ARTERIOVENOUS  FISTULA;  Surgeon: Mal Misty, MD;  Location: [facility];  Service: Vascular;  Laterality: Left;  Left brachio-cephalic fistula  . Insertion of dialysis catheter  05/22/2012    Procedure: INSERTION OF DIALYSIS CATHETER;  Surgeon: Mal Misty, MD;  Location: [place];  Service: Vascular;  Laterality: N/A;  right internal jugular vein  . Av fistula  placement  06/18/2012    Procedure: ARTERIOVENOUS (AV) FISTULA CREATION;  Surgeon: Mal Misty, MD;  Location: [facility];  Service: Vascular;  Laterality: Right;  [place]  . Colonoscopy      Hx: of  . Capd insertion N/A 01/14/2013    Procedure: LAPAROSCOPIC INSERTION CONTINUOUS AMBULATORY PERITONEAL DIALYSIS  (CAPD) CATHETER;  Surgeon: Adin Hector, MD;  Location: [facility] [place];  Service: General;  Laterality: N/A;  . Vascular surgery    . Removal of a dialysis catheter    . Capd removal N/A 07/09/2013    Procedure: CONTINUOUS AMBULATORY PERITONEAL DIALYSIS  (CAPD) CATHETER REMOVAL;  Surgeon: Adin Hector, MD;  Location: [place];  Service: General;  Laterality: N/A;  . Amputation Bilateral 07/30/2013    Procedure: AMPUTATION BELOW KNEE;  Surgeon: Newt Minion, MD;  Location: [place];  Service: Orthopedics;  Laterality: Bilateral;  Bilateral Below Knee  Amputations  . Amputation Bilateral 09/03/2013    Procedure: AMPUTATION BELOW KNEE;  Surgeon: Newt Minion, MD;  Location: [facility];  Service: Orthopedics;  Laterality: Bilateral;  Bilateral Below Knee Amputation Revision  . Lower extremity angiogram N/A 06/14/2013    Procedure: LOWER EXTREMITY ANGIOGRAM;  Surgeon: Angelia Mould, MD;  Location: [facility] CATH LAB;  Service: Cardiovascular;  Laterality: N/A;    There were no vitals taken for this visit.  Visit Diagnosis:  Abnormality of gait  Activity intolerance  S/P bilateral BKA (below knee amputation)      Subjective Assessment - 05/09/14 0802    Symptoms Fallen 2 times - one putting trash in can & descending curb (caught self on can & car) no injuries   Currently in Pain? No/denies                    [facility] Adult PT Treatment/Exercise - 05/09/14 0800    Transfers   Sit to Stand 5: Supervision;With upper extremity assist   Stand to Sit 5: Supervision;With upper extremity assist   Ambulation/Gait   Ambulation/Gait Yes   Ambulation/Gait Assistance 5: Supervision   Ambulation/Gait Assistance Details cues on crutch placement to facilitate upright trunk   Ambulation Distance (Feet) 200 Feet  2x   Assistive device Lofstrands   Gait Pattern Step-through pattern;Decreased stride length;Wide base of support   Gait velocity decreased   Stairs No   High Level Balance   High Level Balance Activities Side stepping;Backward walking;Marching forwards  in parallel bars   High Level Balance Comments verbal cues on wt shift   Knee/Hip Exercises: Machines for Strengthening   Cybex Leg Press 100# B LE 15 reps, 50# single leg 10 reps each   Knee/Hip Exercises: Standing   Forward Step Up 5 reps  in parallel bars   Step Down Right;Left;10 reps;Hand Hold: 2;Hand Hold: 1;Step Height: 4"  in parallel bars   Functional Squat 2 sets  1 set each with offset right forward & left forward   Wall Squat 5 reps  using 24" stool for safety  in parallel bars   Prosthetics   Current prosthetic wear tolerance (days/week)  7 days/wk   Current prosthetic wear tolerance (#hours/day)  >90% of awake hours   Residual limb condition  intact per pt/spouse   Donning Prosthesis Modified independent (device/increased time)   Doffing Prosthesis Modified independent (device/increased time)   Balance Exercises   Balance Beam 2" foam beam head movements   minimal assist manual     Neuromuscular Re-Education: standing crossways on 2" foam beam with head  turns, step ups /downs on aerobic step in parallel bars, standing with feet 4" apart red theraband: rows, forward reach, upward reach - alternating and bilateral, modified squats with minimal assist /tactile cues.           PT Education - 05/09/14 0800    Education provided Yes   Education Details PT recommended returning to [facility] after holidays on day of week without PT or dialyis   Person(s) Educated Patient;Spouse   Methods Explanation   Comprehension Verbalized understanding          PT Short Term Goals - 05/04/14 0845    PT SHORT TERM GOAL #1   Baseline MET 04/20/14   PT SHORT TERM GOAL #6   Title Patient will ambulate house hold distance of 11ft with 1 crutch while carrying a cup of water with supervision of PT (Target Date: 05/18/14)   Status On-going   PT SHORT TERM GOAL #7   Title Patient will improve BERG balance > 36/56 (Target Date: 05/18/14)   Time 4   Period Weeks   Status On-going   PT SHORT TERM GOAL #8   Title ambulates 700' with forearm crutches modified independent. (Target Date: 05/18/14)   Time 4   Period Weeks   Status On-going   PT SHORT TERM GOAL #9   TITLE negotiate ramp & curb with forearm crutches & bilateral prostheses with supervision. (Target Date: 05/18/14)   Time 4   Period Weeks   Status On-going           PT Long Term Goals - 05/04/14 0845    PT LONG TERM GOAL #1   Title Pt will be able to: Ambulate 875ft with LRAD & prostheses  modified independent. (06/17/14)   Time 2   Period Months   Status On-going   PT LONG TERM GOAL #2   Title Pt will be able to: ambulate 200' on uneven (grass) surfaces with LRAD & prostheses modified independent.  (06/17/14)   Time 2   Period Months   Status On-going   PT LONG TERM GOAL #3   Title Pt will be able to: negotiate ramp, curb, stairs with LRAD & prostheses modified independent.  (06/17/14)   Time 2   Period Months   Status On-going   PT LONG TERM GOAL #4   Title Pt will be able to: perform standing activities for > 20 minutes & prostheses with no pain or discomfort.  (06/17/14)   Time 2   Period Months   Status On-going   PT LONG TERM GOAL #5   Title Berg Balance >/= 45/56 (Target Date: 06/17/14)   Time 2   Period Months   Status On-going   PT LONG TERM GOAL #6   Title Pt will be able to: self-report via FOTO improvement >10points in Functional Status Measure  (06/17/14)   Time 2   Period Months   Status On-going               Plan - 05/09/14 0800    Clinical Impression Statement Patient has difficulty with left knee eccentric strength activities like curb Joesphine Bare. Patient needs further balance & gait with prostheses.   Pt will benefit from skilled therapeutic intervention in order to improve on the following deficits Abnormal gait;Decreased activity tolerance;Decreased balance;Decreased knowledge of use of DME;Decreased mobility;Decreased strength;Decreased endurance;Difficulty walking   Rehab Potential Good   PT Frequency 2x / week   PT Duration 8 weeks   PT Treatment/Interventions ADLs/Self Care Home  Management;DME Instruction;Gait training;Stair training;Functional mobility training;Therapeutic activities;Therapeutic exercise;Balance training;Neuromuscular re-education;Patient/family education;Other (comment)  prosthetic training   PT Next Visit Plan gait with 1 crutch & without device., balance activities.   Consulted and Agree with Plan of Care  Patient;Family member/caregiver   Family Member Consulted wife   PT Plan Continue outdoor ambulation and curb training with forearm crutches.         Problem List Patient Active Problem List   Diagnosis Date Noted  . Abnormal cardiovascular stress test 12/29/2013  . Awaiting organ transplant 12/09/2013  . Leg ulcer 11/17/2013  . Chronic kidney disease (CKD), stage V 11/17/2013  . Diabetes 11/17/2013  . Cancer of duodenum 11/17/2013  . Dyslipidemia 11/17/2013  . Chronic kidney disease requiring chronic dialysis 11/17/2013  . Hypercholesteremia 11/17/2013  . H/O malignant neoplasm 11/17/2013  . H/O: HTN (hypertension) 11/17/2013  . BP (high blood pressure) 11/17/2013  . Hypotension 11/17/2013  . Angiopathy, peripheral 11/17/2013  . AF (paroxysmal atrial fibrillation) 11/17/2013  . BKA stump complication 86/16/8372  . S/P bilateral BKA (below knee amputation) 08/03/2013  . S/P BKA (below knee amputation) bilateral 07/30/2013  . Open wound of both legs with complication 90/21/1155  . Acute blood loss anemia 07/12/2013  . Syncope 07/09/2013  . Intolerance to CAPD peritoneal dialysis s/p CAPD cath removal 07/09/2013 07/05/2013  . Critical lower limb ischemia 06/30/2013  . Extremity pain 06/30/2013  . Atherosclerosis of native arteries of the extremities with ulceration(440.23) 06/17/2013  . Gait disorder 05/31/2013  . Weakness 05/24/2013  . Depression 04/03/2013  . GERD (gastroesophageal reflux disease) 04/03/2013  . Atrial fibrillation 04/02/2013  . DM (diabetes mellitus) 12/28/2012  . Other complications due to renal dialysis device, implant, and graft 05/19/2012  . Diabetes mellitus, type 2 11/13/2011  . Anemia 07/02/2011  . ESRD on hemodialysis 05/29/2011    Jamey Reas, PT, DPT 05/09/2014, 12:22 PM  [facility] [address] [place], [place], [address] Phone: [phone]   Fax:  [phone]

## 2014-05-11 ENCOUNTER — Ambulatory Visit: Payer: Managed Care, Other (non HMO) | Admitting: Physical Therapy

## 2014-05-11 ENCOUNTER — Encounter: Payer: Self-pay | Admitting: Physical Therapy

## 2014-05-11 DIAGNOSIS — R269 Unspecified abnormalities of gait and mobility: Secondary | ICD-10-CM

## 2014-05-11 DIAGNOSIS — R531 Weakness: Secondary | ICD-10-CM

## 2014-05-11 DIAGNOSIS — Z89511 Acquired absence of right leg below knee: Secondary | ICD-10-CM

## 2014-05-11 DIAGNOSIS — Z89512 Acquired absence of left leg below knee: Secondary | ICD-10-CM

## 2014-05-11 DIAGNOSIS — R6889 Other general symptoms and signs: Secondary | ICD-10-CM

## 2014-05-11 NOTE — Therapy (Signed)
[facility] [address] [place], [place], [address] Phone: [phone]   Fax:  [phone]  Physical Therapy Treatment  Patient Details  Name: Paul [name] MRN: [id number] Date of Birth: Jun 20, 1955  Encounter Date: 05/11/2014      PT End of Session - 05/11/14 0800    Visit Number 29   Number of Visits 40   Date for PT Re-Evaluation 06/17/14   Authorization Type [employer]   Authorization - Visit Number 29   Authorization - Number of Visits 60   PT Start Time 0800   PT Stop Time 0845   PT Time Calculation (min) 45 min   Equipment Utilized During Treatment Gait belt   Activity Tolerance Patient tolerated treatment well   Behavior During Therapy [facility] for tasks assessed/performed      Past Medical History  Diagnosis Date  . Open wound of both legs with complication     Pt has had progressive wounds of both LE's including gangrene of the toes and patchy necrosis of the calves.  [facility] Jimmey Ralph with hyperbaric O2 5d per week.  He is getting Na Thiosulfate with HD for suspected calciphylaxis. Pt says doctor's aren't sure if these ulcers were diabetic ulcers or calciphylaxis.  He underwent bilat BKA on 07/30/13.  He says that the woun  . ESRD on hemodialysis     Pt has ESRD due to DM.  He had a L upper arm AVF prior to starting HD which was ligated due to L arm steal syndrome.  He started HD in Jan 2014 and did home HD.  The family couldn't cannulate the R arm AVF successfully so by mid 2014 they decided to switch to PD which was done late summer 2014.  In Jan 2015 he was admitted with FTT felt to be due to underdialysis on PD and PD was abandoned and he   . Anemia     esrd  . Hypertension     off of meds due to orthostatic hypotension  . Closed left arm fracture 1967  . Heart murmur   . Corneal abrasion, left   . GERD (gastroesophageal reflux disease)   . History of blood transfusion   .  Glomerulosclerosis, diabetic   . Depression   . A-fib   . Calciphylaxis 05/2013    lower ext  . Diabetes mellitus     Type 1 iddm x 11 yrs  . Diabetic neuropathy   . Cancer     hx duodenal adenoCa 2002, resected    Past Surgical History  Procedure Laterality Date  . Whipple procedure  2002    duodenal ca, Dr. Gerrit Friends  . Bone marrow biopsy  2012  . Renal biopsy, percutaneous  2012  . Tonsillectomy    . Portacath placement  2002  . Biliary diversion, external  2002  . Other surgical history      Cyst removed from back  . Av fistula placement  06/07/2011    Procedure: ARTERIOVENOUS (AV) FISTULA CREATION;  Surgeon: Chuck Hint, MD;  Location: [facility] OR;  Service: Vascular;  Laterality: Left;  . Ligation of arteriovenous  fistula  05/22/2012    Procedure: LIGATION OF ARTERIOVENOUS  FISTULA;  Surgeon: Pryor Ochoa, MD;  Location: [facility] OR;  Service: Vascular;  Laterality: Left;  Left brachio-cephalic fistula  . Insertion of dialysis catheter  05/22/2012    Procedure: INSERTION OF DIALYSIS CATHETER;  Surgeon: Pryor Ochoa,  MD;  Location: [facility] OR;  Service: Vascular;  Laterality: N/A;  right internal jugular vein  . Av fistula placement  06/18/2012    Procedure: ARTERIOVENOUS (AV) FISTULA CREATION;  Surgeon: Mal Misty, MD;  Location: [place];  Service: Vascular;  Laterality: Right;  [place]  . Colonoscopy      Hx: of  . Capd insertion N/A 01/14/2013    Procedure: LAPAROSCOPIC INSERTION CONTINUOUS AMBULATORY PERITONEAL DIALYSIS  (CAPD) CATHETER;  Surgeon: Adin Hector, MD;  Location: [place];  Service: General;  Laterality: N/A;  . Vascular surgery    . Removal of a dialysis catheter    . Capd removal N/A 07/09/2013    Procedure: CONTINUOUS AMBULATORY PERITONEAL DIALYSIS  (CAPD) CATHETER REMOVAL;  Surgeon: Adin Hector, MD;  Location: [place];  Service: General;  Laterality: N/A;  . Amputation Bilateral 07/30/2013    Procedure: AMPUTATION BELOW KNEE;  Surgeon: Newt Minion, MD;   Location: [place];  Service: Orthopedics;  Laterality: Bilateral;  Bilateral Below Knee Amputations  . Amputation Bilateral 09/03/2013    Procedure: AMPUTATION BELOW KNEE;  Surgeon: Newt Minion, MD;  Location: [place];  Service: Orthopedics;  Laterality: Bilateral;  Bilateral Below Knee Amputation Revision  . Lower extremity angiogram N/A 06/14/2013    Procedure: LOWER EXTREMITY ANGIOGRAM;  Surgeon: Angelia Mould, MD;  Location: [facility] CATH LAB;  Service: Cardiovascular;  Laterality: N/A;    There were no vitals taken for this visit.  Visit Diagnosis:  Abnormality of gait  Activity intolerance  S/P bilateral BKA (below knee amputation)  Weakness generalized      Subjective Assessment - 05/11/14 0823    Symptoms Some muscle soreness from last session but not bad.   Currently in Pain? No/denies                    [facility] Adult PT Treatment/Exercise - 05/11/14 0800    Transfers   Sit to Stand 5: Supervision;With upper extremity assist  10 reps 2 sets   Stand to Sit 5: Supervision;With upper extremity assist  10 reps, 2 sets   Stand to Sit Details cues on technique to use LEs > UEs wt distribution   Ambulation/Gait   Ambulation/Gait Yes   Ambulation/Gait Assistance 4: Min assist;4: Min guard  minA no device, min gaurd 1 crutch   Ambulation Distance (Feet) 20 Feet  20' X 2 without device, 125' with 1 crutch   Assistive device Lofstrands;None  Bilateral prostheses   Gait Pattern Step-through pattern;Decreased stride length;Wide base of support   Gait velocity decreased   Stairs No   Knee/Hip Exercises: Machines for Strengthening   Cybex Leg Press 90# B LE 15 reps, 40# single leg 10 reps each 2 sets of all   Prosthetics   Prosthetic Care Comments  carrying drinks   Current prosthetic wear tolerance (days/week)  7 days/wk   Current prosthetic wear tolerance (#hours/day)  >90% of awake hours   Residual limb condition  intact per pt/spouse   Education Provided  Prosthetic cleaning  discussed use of oil weekly for deeper clean & daily in show   Person(s) Educated Patient;Spouse   Education Method Explanation   Education Method Verbalized understanding   Donning Prosthesis Modified independent (device/increased time)   Doffing Prosthesis Modified independent (device/increased time)   Balance Exercises: Standing   Balance Beam --  minimal assist manual                PT Education -  05/11/14 0800    Education provided --   Education Details eccentric vs concentric muscle contractions - weakness, function, soreness   Person(s) Educated Patient;Spouse   Methods Explanation   Comprehension Verbalized understanding          PT Short Term Goals - 05/04/14 0845    PT SHORT TERM GOAL #1   Baseline MET 04/20/14   PT SHORT TERM GOAL #6   Title Patient will ambulate house hold distance of 70ft with 1 crutch while carrying a cup of water with supervision of PT (Target Date: 05/18/14)   Status On-going   PT SHORT TERM GOAL #7   Title Patient will improve BERG balance > 36/56 (Target Date: 05/18/14)   Time 4   Period Weeks   Status On-going   PT SHORT TERM GOAL #8   Title ambulates 700' with forearm crutches modified independent. (Target Date: 05/18/14)   Time 4   Period Weeks   Status On-going   PT SHORT TERM GOAL #9   TITLE negotiate ramp & curb with forearm crutches & bilateral prostheses with supervision. (Target Date: 05/18/14)   Time 4   Period Weeks   Status On-going           PT Long Term Goals - 05/04/14 0845    PT LONG TERM GOAL #1   Title Pt will be able to: Ambulate 858ft with LRAD & prostheses modified independent. (06/17/14)   Time 2   Period Months   Status On-going   PT LONG TERM GOAL #2   Title Pt will be able to: ambulate 200' on uneven (grass) surfaces with LRAD & prostheses modified independent.  (06/17/14)   Time 2   Period Months   Status On-going   PT LONG TERM GOAL #3   Title Pt will be able to:  negotiate ramp, curb, stairs with LRAD & prostheses modified independent.  (06/17/14)   Time 2   Period Months   Status On-going   PT LONG TERM GOAL #4   Title Pt will be able to: perform standing activities for > 20 minutes & prostheses with no pain or discomfort.  (06/17/14)   Time 2   Period Months   Status On-going   PT LONG TERM GOAL #5   Title Berg Balance >/= 45/56 (Target Date: 06/17/14)   Time 2   Period Months   Status On-going   PT LONG TERM GOAL #6   Title Pt will be able to: self-report via FOTO improvement >10points in Functional Status Measure  (06/17/14)   Time 2   Period Months   Status On-going               Plan - 05/11/14 0800    Clinical Impression Statement patient had difficulty with eccentric knee control with stand to sit activity. Patient's balance with gait with 1 crutch & no device are improving with less assistance.   Pt will benefit from skilled therapeutic intervention in order to improve on the following deficits Abnormal gait;Decreased activity tolerance;Decreased balance;Decreased knowledge of use of DME;Decreased mobility;Decreased strength;Decreased endurance;Difficulty walking   Rehab Potential Good   PT Frequency 2x / week   PT Duration 8 weeks   PT Treatment/Interventions ADLs/Self Care Home Management;DME Instruction;Gait training;Stair training;Functional mobility training;Therapeutic activities;Therapeutic exercise;Balance training;Neuromuscular re-education;Patient/family education;Other (comment)  prosthetic training   PT Next Visit Plan gait with 1 crutch & without device., balance activities.   Consulted and Agree with Plan of Care Patient;Family member/caregiver   Family Member  Consulted wife   PT Plan Continue outdoor ambulation and curb training with forearm crutches.         Problem List Patient Active Problem List   Diagnosis Date Noted  . Abnormal cardiovascular stress test 12/29/2013  . Awaiting organ transplant  12/09/2013  . Leg ulcer 11/17/2013  . Chronic kidney disease (CKD), stage V 11/17/2013  . Diabetes 11/17/2013  . Cancer of duodenum 11/17/2013  . Dyslipidemia 11/17/2013  . Chronic kidney disease requiring chronic dialysis 11/17/2013  . Hypercholesteremia 11/17/2013  . H/O malignant neoplasm 11/17/2013  . H/O: HTN (hypertension) 11/17/2013  . BP (high blood pressure) 11/17/2013  . Hypotension 11/17/2013  . Angiopathy, peripheral 11/17/2013  . AF (paroxysmal atrial fibrillation) 11/17/2013  . BKA stump complication 76/28/3151  . S/P bilateral BKA (below knee amputation) 08/03/2013  . S/P BKA (below knee amputation) bilateral 07/30/2013  . Open wound of both legs with complication 76/16/0737  . Acute blood loss anemia 07/12/2013  . Syncope 07/09/2013  . Intolerance to CAPD peritoneal dialysis s/p CAPD cath removal 07/09/2013 07/05/2013  . Critical lower limb ischemia 06/30/2013  . Extremity pain 06/30/2013  . Atherosclerosis of native arteries of the extremities with ulceration(440.23) 06/17/2013  . Gait disorder 05/31/2013  . Weakness 05/24/2013  . Depression 04/03/2013  . GERD (gastroesophageal reflux disease) 04/03/2013  . Atrial fibrillation 04/02/2013  . DM (diabetes mellitus) 12/28/2012  . Other complications due to renal dialysis device, implant, and graft 05/19/2012  . Diabetes mellitus, type 2 11/13/2011  . Anemia 07/02/2011  . ESRD on hemodialysis 05/29/2011   Jamey Reas, PT, DPT PT Specializing in [place] 05/11/2014 9:53 AM Phone:  [phone]  Fax:  [phone] [facility] [address] [place], [place] [address]  Jamey Reas 05/11/2014, 9:52 AM  [facility] [address] [place], [place], [address] Phone: [phone]   Fax:  [phone]

## 2014-05-16 ENCOUNTER — Encounter: Payer: Self-pay | Admitting: Physical Therapy

## 2014-05-16 ENCOUNTER — Ambulatory Visit: Payer: Managed Care, Other (non HMO) | Admitting: Physical Therapy

## 2014-05-16 DIAGNOSIS — Z89512 Acquired absence of left leg below knee: Secondary | ICD-10-CM

## 2014-05-16 DIAGNOSIS — R6889 Other general symptoms and signs: Secondary | ICD-10-CM

## 2014-05-16 DIAGNOSIS — R269 Unspecified abnormalities of gait and mobility: Secondary | ICD-10-CM

## 2014-05-16 DIAGNOSIS — R5381 Other malaise: Secondary | ICD-10-CM

## 2014-05-16 DIAGNOSIS — R531 Weakness: Secondary | ICD-10-CM

## 2014-05-16 DIAGNOSIS — Z89511 Acquired absence of right leg below knee: Secondary | ICD-10-CM

## 2014-05-16 NOTE — Therapy (Signed)
[facility] [address] [place], [place], [address] Phone: [phone]   Fax:  [phone]  Physical Therapy Treatment  Patient Details  Name: Paul [name] MRN: [id number] Date of Birth: 08-17-1955  Encounter Date: 05/16/2014      PT End of Session - 05/16/14 0808    Visit Number 30   Number of Visits 40   Date for PT Re-Evaluation 06/17/14   Authorization Type [employer]   Authorization - Visit Number 24   Authorization - Number of Visits 60   PT Start Time 0801   PT Stop Time 0849   PT Time Calculation (min) 48 min   Equipment Utilized During Treatment Gait belt   Activity Tolerance Patient tolerated treatment well   Behavior During Therapy [facility] for tasks assessed/performed      Past Medical History  Diagnosis Date  . Open wound of both legs with complication     Pt has had progressive wounds of both LE's including gangrene of the toes and patchy necrosis of the calves.  He has been treated at [facility] by Dr Jerline Pain with hyperbaric O2 5d per week.  He is getting Na Thiosulfate with HD for suspected calciphylaxis. Pt says doctor's aren't sure if these ulcers were diabetic ulcers or calciphylaxis.  He underwent bilat BKA on 07/30/13.  He says that the woun  . ESRD on hemodialysis     Pt has ESRD due to DM.  He had a L upper arm AVF prior to starting HD which was ligated due to L arm steal syndrome.  He started HD in Jan 2014 and did home HD.  The family couldn't cannulate the R arm AVF successfully so by mid 2014 they decided to switch to PD which was done late summer 2014.  In Jan 2015 he was admitted with FTT felt to be due to underdialysis on PD and PD was abandoned and he   . Anemia     esrd  . Hypertension     off of meds due to orthostatic hypotension  . Closed left arm fracture 1967  . Heart murmur   . Corneal abrasion, left   . GERD (gastroesophageal reflux disease)   . History of blood transfusion   .  Glomerulosclerosis, diabetic   . Depression   . A-fib   . Calciphylaxis 05/2013    lower ext  . Diabetes mellitus     Type 1 iddm x 11 yrs  . Diabetic neuropathy   . Cancer     hx duodenal adenoCa 2002, resected    Past Surgical History  Procedure Laterality Date  . Whipple procedure  2002    duodenal ca, Dr. Harlow Asa  . Bone marrow biopsy  2012  . Renal biopsy, percutaneous  2012  . Tonsillectomy    . Portacath placement  2002  . Biliary diversion, external  2002  . Other surgical history      Cyst removed from back  . Av fistula placement  06/07/2011    Procedure: ARTERIOVENOUS (AV) FISTULA CREATION;  Surgeon: Angelia Mould, MD;  Location: [place];  Service: Vascular;  Laterality: Left;  . Ligation of arteriovenous  fistula  05/22/2012    Procedure: LIGATION OF ARTERIOVENOUS  FISTULA;  Surgeon: Mal Misty, MD;  Location: [place];  Service: Vascular;  Laterality: Left;  Left brachio-cephalic fistula  . Insertion of dialysis catheter  05/22/2012    Procedure: INSERTION OF DIALYSIS CATHETER;  Surgeon: Mal Misty,  MD;  Location: [facility] OR;  Service: Vascular;  Laterality: N/A;  right internal jugular vein  . Av fistula placement  06/18/2012    Procedure: ARTERIOVENOUS (AV) FISTULA CREATION;  Surgeon: Mal Misty, MD;  Location: [place];  Service: Vascular;  Laterality: Right;  [place]  . Colonoscopy      Hx: of  . Capd insertion N/A 01/14/2013    Procedure: LAPAROSCOPIC INSERTION CONTINUOUS AMBULATORY PERITONEAL DIALYSIS  (CAPD) CATHETER;  Surgeon: Adin Hector, MD;  Location: [place];  Service: General;  Laterality: N/A;  . Vascular surgery    . Removal of a dialysis catheter    . Capd removal N/A 07/09/2013    Procedure: CONTINUOUS AMBULATORY PERITONEAL DIALYSIS  (CAPD) CATHETER REMOVAL;  Surgeon: Adin Hector, MD;  Location: [place];  Service: General;  Laterality: N/A;  . Amputation Bilateral 07/30/2013    Procedure: AMPUTATION BELOW KNEE;  Surgeon: Newt Minion, MD;   Location: [place];  Service: Orthopedics;  Laterality: Bilateral;  Bilateral Below Knee Amputations  . Amputation Bilateral 09/03/2013    Procedure: AMPUTATION BELOW KNEE;  Surgeon: Newt Minion, MD;  Location: [place];  Service: Orthopedics;  Laterality: Bilateral;  Bilateral Below Knee Amputation Revision  . Lower extremity angiogram N/A 06/14/2013    Procedure: LOWER EXTREMITY ANGIOGRAM;  Surgeon: Angelia Mould, MD;  Location: [facility] CATH LAB;  Service: Cardiovascular;  Laterality: N/A;    There were no vitals taken for this visit.  Visit Diagnosis:  Abnormality of gait  Activity intolerance  S/P bilateral BKA (below knee amputation)  Weakness generalized  Debility      Subjective Assessment - 05/16/14 0808    Symptoms No new complaints. No falls or pain to report.   Pain Score 0-No pain          [facility] Adult PT Treatment/Exercise - 05/16/14 0809    Transfers   Sit to Stand 5: Supervision   Sit to Stand Details (indicate cue type and reason) cues to decrease UE reliance with standing/stabilizing           Stand to Sit 5: Supervision   Stand to Sit Details cues to use legs > UE 's to slow descent with sitting down.   Ambulation/Gait   Ambulation/Gait Yes   Ambulation/Gait Assistance 4: Min assist   Ambulation/Gait Assistance Details cues on posture, to decrease  BOS and on crutch placement   Ambulation Distance (Feet) 125 Feet  x 2 one crutch, plus around gym with other activities   Assistive device Lofstrands   Gait Pattern Step-through pattern;Decreased stride length;Wide base of support   Gait velocity decreased   Dynamic Standing Balance   Dynamic Standing - Balance Support No upper extremity supported;During functional activity   Dynamic Standing - Level of Assistance 4: Min assist   Dynamic Standing - Balance Activities Rocker board   Dynamic Standing - Comments both ways on board: static hold, rocks, hold with alternating UE raise, hold with bil UE raises                      Knee/Hip Exercises: Machines for Strengthening   Cybex Leg Press 90# both legs x15 reps, then 100# x 15 reps; 40# single leg 15 reps, then 45# x 15 reps each leg  3 second hold with all reps,    Prosthetics   Current prosthetic wear tolerance (days/week)  7 days/wk   Current prosthetic wear tolerance (#hours/day)  >90% of awake hours  Edema none   Residual limb condition  intact per pt/spouse   Donning Prosthesis Modified independent (device/increased time)   Doffing Prosthesis Modified independent (device/increased time)           PT Short Term Goals - 05/04/14 0845    PT SHORT TERM GOAL #1   Baseline MET 04/20/14   PT SHORT TERM GOAL #6   Title Patient will ambulate house hold distance of 62ft with 1 crutch while carrying a cup of water with supervision of PT (Target Date: 05/18/14)   Status On-going   PT SHORT TERM GOAL #7   Title Patient will improve BERG balance > 36/56 (Target Date: 05/18/14)   Time 4   Period Weeks   Status On-going   PT SHORT TERM GOAL #8   Title ambulates 700' with forearm crutches modified independent. (Target Date: 05/18/14)   Time 4   Period Weeks   Status On-going   PT SHORT TERM GOAL #9   TITLE negotiate ramp & curb with forearm crutches & bilateral prostheses with supervision. (Target Date: 05/18/14)   Time 4   Period Weeks   Status On-going           PT Long Term Goals - 05/04/14 0845    PT LONG TERM GOAL #1   Title Pt will be able to: Ambulate 832ft with LRAD & prostheses modified independent. (06/17/14)   Time 2   Period Months   Status On-going   PT LONG TERM GOAL #2   Title Pt will be able to: ambulate 200' on uneven (grass) surfaces with LRAD & prostheses modified independent.  (06/17/14)   Time 2   Period Months   Status On-going   PT LONG TERM GOAL #3   Title Pt will be able to: negotiate ramp, curb, stairs with LRAD & prostheses modified independent.  (06/17/14)   Time 2   Period Months   Status On-going    PT LONG TERM GOAL #4   Title Pt will be able to: perform standing activities for > 20 minutes & prostheses with no pain or discomfort.  (06/17/14)   Time 2   Period Months   Status On-going   PT LONG TERM GOAL #5   Title Berg Balance >/= 45/56 (Target Date: 06/17/14)   Time 2   Period Months   Status On-going   PT LONG TERM GOAL #6   Title Pt will be able to: self-report via FOTO improvement >10points in Functional Status Measure  (06/17/14)   Time 2   Period Months   Status On-going           Plan - 05/16/14 1241    Clinical Impression Statement Pt making steady progress toward goals.   Pt will benefit from skilled therapeutic intervention in order to improve on the following deficits Abnormal gait;Decreased activity tolerance;Decreased balance;Decreased knowledge of use of DME;Decreased mobility;Decreased strength;Decreased endurance;Difficulty walking   Rehab Potential Good   PT Frequency 2x / week   PT Duration 8 weeks   PT Treatment/Interventions ADLs/Self Care Home Management;DME Instruction;Gait training;Stair training;Functional mobility training;Therapeutic activities;Therapeutic exercise;Balance training;Neuromuscular re-education;Patient/family education;Other (comment)  prosthetic training   PT Next Visit Plan gait with 1 crutch & without device., balance activities.   Consulted and Agree with Plan of Care Patient;Family member/caregiver   Family Member Consulted wife   PT Plan Continue outdoor ambulation and curb training with forearm crutches.         Problem List Patient Active Problem List   Diagnosis  Date Noted  . Abnormal cardiovascular stress test 12/29/2013  . Awaiting organ transplant 12/09/2013  . Leg ulcer 11/17/2013  . Chronic kidney disease (CKD), stage V 11/17/2013  . Diabetes 11/17/2013  . Cancer of duodenum 11/17/2013  . Dyslipidemia 11/17/2013  . Chronic kidney disease requiring chronic dialysis 11/17/2013  . Hypercholesteremia 11/17/2013  .  H/O malignant neoplasm 11/17/2013  . H/O: HTN (hypertension) 11/17/2013  . BP (high blood pressure) 11/17/2013  . Hypotension 11/17/2013  . Angiopathy, peripheral 11/17/2013  . AF (paroxysmal atrial fibrillation) 11/17/2013  . BKA stump complication 25/36/6440  . S/P bilateral BKA (below knee amputation) 08/03/2013  . S/P BKA (below knee amputation) bilateral 07/30/2013  . Open wound of both legs with complication 34/74/2595  . Acute blood loss anemia 07/12/2013  . Syncope 07/09/2013  . Intolerance to CAPD peritoneal dialysis s/p CAPD cath removal 07/09/2013 07/05/2013  . Critical lower limb ischemia 06/30/2013  . Extremity pain 06/30/2013  . Atherosclerosis of native arteries of the extremities with ulceration(440.23) 06/17/2013  . Gait disorder 05/31/2013  . Weakness 05/24/2013  . Depression 04/03/2013  . GERD (gastroesophageal reflux disease) 04/03/2013  . Atrial fibrillation 04/02/2013  . DM (diabetes mellitus) 12/28/2012  . Other complications due to renal dialysis device, implant, and graft 05/19/2012  . Diabetes mellitus, type 2 11/13/2011  . Anemia 07/02/2011  . ESRD on hemodialysis 05/29/2011    Willow Ora 05/16/2014, 12:43 PM  Willow Ora, PTA, [facility] [address], [place], [place] [address] [phone] 05/16/2014, 12:48 PM

## 2014-05-18 ENCOUNTER — Encounter: Payer: Self-pay | Admitting: Physical Therapy

## 2014-05-18 ENCOUNTER — Ambulatory Visit: Payer: Managed Care, Other (non HMO) | Admitting: Physical Therapy

## 2014-05-18 DIAGNOSIS — R531 Weakness: Secondary | ICD-10-CM

## 2014-05-18 DIAGNOSIS — R6889 Other general symptoms and signs: Secondary | ICD-10-CM

## 2014-05-18 DIAGNOSIS — Z89511 Acquired absence of right leg below knee: Secondary | ICD-10-CM

## 2014-05-18 DIAGNOSIS — R269 Unspecified abnormalities of gait and mobility: Secondary | ICD-10-CM | POA: Diagnosis not present

## 2014-05-18 DIAGNOSIS — Z89512 Acquired absence of left leg below knee: Secondary | ICD-10-CM

## 2014-05-18 DIAGNOSIS — R5381 Other malaise: Secondary | ICD-10-CM

## 2014-05-18 NOTE — Therapy (Signed)
[facility] [address] [place], [place], [address] Phone: [phone]   Fax:  [phone]  Physical Therapy Treatment  Patient Details  Name: Paul [name] MRN: [id number] Date of Birth: [birth year]  Encounter Date: 05/18/2014      PT End of Session - 05/18/14 0852    Visit Number 31   Number of Visits 40   Date for PT Re-Evaluation 06/17/14   Authorization Type [employer]   Authorization - Visit Number 23   Authorization - Number of Visits 60   PT Start Time 0803   PT Stop Time 4259   PT Time Calculation (min) 44 min   Equipment Utilized During Treatment Gait belt   Activity Tolerance Patient tolerated treatment well   Behavior During Therapy [facility] for tasks assessed/performed      Past Medical History  Diagnosis Date  . Open wound of both legs with complication     Pt has had progressive wounds of both LE's including gangrene of the toes and patchy necrosis of the calves.  He has been treated at [facility] by Dr Jerline Pain with hyperbaric O2 5d per week.  He is getting Na Thiosulfate with HD for suspected calciphylaxis. Pt says doctor's aren't sure if these ulcers were diabetic ulcers or calciphylaxis.  He underwent bilat BKA on 07/30/13.  He says that the woun  . ESRD on hemodialysis     Pt has ESRD due to DM.  He had a L upper arm AVF prior to starting HD which was ligated due to L arm steal syndrome.  He started HD in Jan 2014 and did home HD.  The family couldn't cannulate the R arm AVF successfully so by mid 2014 they decided to switch to PD which was done late summer 2014.  In Jan 2015 he was admitted with FTT felt to be due to underdialysis on PD and PD was abandoned and he   . Anemia     esrd  . Hypertension     off of meds due to orthostatic hypotension  . Closed left arm fracture 1967  . Heart murmur   . Corneal abrasion, left   . GERD (gastroesophageal reflux disease)   . History of blood transfusion   .  Glomerulosclerosis, diabetic   . Depression   . A-fib   . Calciphylaxis 05/2013    lower ext  . Diabetes mellitus     Type 1 iddm x 11 yrs  . Diabetic neuropathy   . Cancer     hx duodenal adenoCa 2002, resected    Past Surgical History  Procedure Laterality Date  . Whipple procedure  2002    duodenal ca, Dr. Harlow Asa  . Bone marrow biopsy  2012  . Renal biopsy, percutaneous  2012  . Tonsillectomy    . Portacath placement  2002  . Biliary diversion, external  2002  . Other surgical history      Cyst removed from back  . Av fistula placement  06/07/2011    Procedure: ARTERIOVENOUS (AV) FISTULA CREATION;  Surgeon: Angelia Mould, MD;  Location: [facility];  Service: Vascular;  Laterality: Left;  . Ligation of arteriovenous  fistula  05/22/2012    Procedure: LIGATION OF ARTERIOVENOUS  FISTULA;  Surgeon: Mal Misty, MD;  Location: [facility];  Service: Vascular;  Laterality: Left;  Left brachio-cephalic fistula  . Insertion of dialysis catheter  05/22/2012    Procedure: INSERTION OF DIALYSIS CATHETER;  Surgeon: Mal Misty,  MD;  Location: [facility];  Service: Vascular;  Laterality: N/A;  right internal jugular vein  . Av fistula placement  06/18/2012    Procedure: ARTERIOVENOUS (AV) FISTULA CREATION;  Surgeon: Mal Misty, MD;  Location: [facility];  Service: Vascular;  Laterality: Right;  [place]  . Colonoscopy      Hx: of  . Capd insertion N/A 01/14/2013    Procedure: LAPAROSCOPIC INSERTION CONTINUOUS AMBULATORY PERITONEAL DIALYSIS  (CAPD) CATHETER;  Surgeon: Adin Hector, MD;  Location: [place] [facility];  Service: General;  Laterality: N/A;  . Vascular surgery    . Removal of a dialysis catheter    . Capd removal N/A 07/09/2013    Procedure: CONTINUOUS AMBULATORY PERITONEAL DIALYSIS  (CAPD) CATHETER REMOVAL;  Surgeon: Adin Hector, MD;  Location: [place];  Service: General;  Laterality: N/A;  . Amputation Bilateral 07/30/2013    Procedure: AMPUTATION BELOW KNEE;  Surgeon: Newt Minion, MD;   Location: [facility];  Service: Orthopedics;  Laterality: Bilateral;  Bilateral Below Knee Amputations  . Amputation Bilateral 09/03/2013    Procedure: AMPUTATION BELOW KNEE;  Surgeon: Newt Minion, MD;  Location: [facility];  Service: Orthopedics;  Laterality: Bilateral;  Bilateral Below Knee Amputation Revision  . Lower extremity angiogram N/A 06/14/2013    Procedure: LOWER EXTREMITY ANGIOGRAM;  Surgeon: Angelia Mould, MD;  Location: [facility] CATH LAB;  Service: Cardiovascular;  Laterality: N/A;    There were no vitals taken for this visit.  Visit Diagnosis:  Abnormality of gait  Activity intolerance  S/P bilateral BKA (below knee amputation)  Weakness generalized  Debility      Subjective Assessment - 05/18/14 0809    Symptoms No new complaints. No falls or pain to report.   Currently in Pain? No/denies   Pain Score 0-No pain            [facility] Adult PT Treatment/Exercise - 05/18/14 0809    Transfers   Sit to Stand 5: Supervision;With upper extremity assist;From chair/3-in-1   Sit to Stand Details (indicate cue type and reason) cues to decrease UE reliance with standing   Stand to Sit 5: Supervision;With upper extremity assist;To chair/3-in-1   Stand to Sit Details cues to decrease UE reliance with sitting down   Ambulation/Gait   Ambulation/Gait Yes   Ambulation/Gait Assistance 6: Modified independent (Device/Increase time)   Ambulation/Gait Assistance Details occasional cues on posture with gait with 2 crutches. cues on crutch placement with gait with single crutch while carrying cup of water for 50-60 feet with min guard assist and supervision with remainder of 110 feet when not carrying cup of water.                           Ambulation Distance (Feet) 730 Feet  x1 with both crutches, 110 ft with single crutch   Assistive device Lofstrands   Gait Pattern Step-through pattern;Decreased stride length   Gait velocity decreased   Ramp 5: Supervision   Ramp Details (indicate cue  type and reason) with bil prosthesis and 2 crutches   Curb 4: Min assist   Curb Details (indicate cue type and reason) with bil prostheses and 2 crutches   Berg Balance Test   Sit to Stand Able to stand  independently using hands   Standing Unsupported Able to stand safely 2 minutes   Sitting with Back Unsupported but Feet Supported on Floor or Stool Able to sit safely and securely 2 minutes  Stand to Sit Controls descent by using hands   Transfers Able to transfer safely, minor use of hands   Standing Unsupported with Eyes Closed Able to stand 10 seconds safely   Standing Ubsupported with Feet Together Able to place feet together independently and stand for 1 minute with supervision   From Standing, Reach Forward with Outstretched Arm Can reach forward >12 cm safely (5")  ~8 inches   From Standing Position, Pick up Object from Floor Able to pick up shoe, needs supervision   From Standing Position, Turn to Look Behind Over each Shoulder Looks behind one side only/other side shows less weight shift  right > left   Turn 360 Degrees Able to turn 360 degrees safely but slowly  >20 seconds with out assist   Standing Unsupported, Alternately Place Feet on Step/Stool Able to complete >2 steps/needs minimal assist   Standing Unsupported, One Foot in Front Able to take small step independently and hold 30 seconds   Standing on One Leg Tries to lift leg/unable to hold 3 seconds but remains standing independently   Total Score 40   Prosthetics   Current prosthetic wear tolerance (days/week)  7 days/wk   Current prosthetic wear tolerance (#hours/day)  >90% of awake hours   Edema none   Residual limb condition  intact per pt/spouse   Donning Prosthesis Modified independent (device/increased time)   Doffing Prosthesis Modified independent (device/increased time)           PT Short Term Goals - 05/18/14 0854    PT SHORT TERM GOAL #1   Title Pt will be able to: Ambulate 500 ft. with forearm  crutches and prostheses with supervision. (04/18/14)   Baseline MET 04/20/14   Status Achieved   PT SHORT TERM GOAL #6   Title Patient will ambulate house hold distance of 77ft with 1 crutch while carrying a cup of water with supervision of PT (Target Date: 05/18/14)   Baseline met on 05/18/2014   Status Achieved   PT SHORT TERM GOAL #7   Title Patient will improve BERG balance > 36/56 (Target Date: 05/18/14)   Baseline met on 05/18/2014   Time 4   Period Weeks   Status Achieved   PT SHORT TERM GOAL #8   Title ambulates 700' with forearm crutches modified independent. (Target Date: 05/18/14)   Baseline met on 05/18/2014   Time 4   Period Weeks   Status Achieved   PT SHORT TERM GOAL #9   TITLE negotiate ramp & curb with forearm crutches & bilateral prostheses with supervision. (Target Date: 05/18/14)   Baseline on 05/18/2014- supervision with ramp management and min guard assist to min assist with 6 inch curb management.   Time 4   Period Weeks   Status Partially Met           PT Long Term Goals - 05/04/14 0845    PT LONG TERM GOAL #1   Title Pt will be able to: Ambulate 878ft with LRAD & prostheses modified independent. (06/17/14)   Time 2   Period Months   Status On-going   PT LONG TERM GOAL #2   Title Pt will be able to: ambulate 200' on uneven (grass) surfaces with LRAD & prostheses modified independent.  (06/17/14)   Time 2   Period Months   Status On-going   PT LONG TERM GOAL #3   Title Pt will be able to: negotiate ramp, curb, stairs with LRAD & prostheses modified independent.  (06/17/14)  Time 2   Period Months   Status On-going   PT LONG TERM GOAL #4   Title Pt will be able to: perform standing activities for > 20 minutes & prostheses with no pain or discomfort.  (06/17/14)   Time 2   Period Months   Status On-going   PT LONG TERM GOAL #5   Title Berg Balance >/= 45/56 (Target Date: 06/17/14)   Time 2   Period Months   Status On-going   PT LONG TERM GOAL  #6   Title Pt will be able to: self-report via FOTO improvement >10points in Functional Status Measure  (06/17/14)   Time 2   Period Months   Status On-going           Plan - 05/18/14 7588    Clinical Impression Statement Progress toward STG's checked with goals 6-8 met and number 9 partially met. Pt progressing toward LTG's as well.   Pt will benefit from skilled therapeutic intervention in order to improve on the following deficits Abnormal gait;Decreased activity tolerance;Decreased balance;Decreased knowledge of use of DME;Decreased mobility;Decreased strength;Decreased endurance;Difficulty walking   Rehab Potential Good   PT Frequency 2x / week   PT Duration 8 weeks   PT Treatment/Interventions ADLs/Self Care Home Management;DME Instruction;Gait training;Stair training;Functional mobility training;Therapeutic activities;Therapeutic exercise;Balance training;Neuromuscular re-education;Patient/family education;Other (comment)  prosthetic training   PT Next Visit Plan gait with 1 crutch & without device., balance activities.   Consulted and Agree with Plan of Care Patient;Family member/caregiver   Family Member Consulted wife   PT Plan Continue outdoor ambulation and curb training with forearm crutches.         Problem List Patient Active Problem List   Diagnosis Date Noted  . Abnormal cardiovascular stress test 12/29/2013  . Awaiting organ transplant 12/09/2013  . Leg ulcer 11/17/2013  . Chronic kidney disease (CKD), stage V 11/17/2013  . Diabetes 11/17/2013  . Cancer of duodenum 11/17/2013  . Dyslipidemia 11/17/2013  . Chronic kidney disease requiring chronic dialysis 11/17/2013  . Hypercholesteremia 11/17/2013  . H/O malignant neoplasm 11/17/2013  . H/O: HTN (hypertension) 11/17/2013  . BP (high blood pressure) 11/17/2013  . Hypotension 11/17/2013  . Angiopathy, peripheral 11/17/2013  . AF (paroxysmal atrial fibrillation) 11/17/2013  . BKA stump complication  32/54/9826  . S/P bilateral BKA (below knee amputation) 08/03/2013  . S/P BKA (below knee amputation) bilateral 07/30/2013  . Open wound of both legs with complication 41/58/3094  . Acute blood loss anemia 07/12/2013  . Syncope 07/09/2013  . Intolerance to CAPD peritoneal dialysis s/p CAPD cath removal 07/09/2013 07/05/2013  . Critical lower limb ischemia 06/30/2013  . Extremity pain 06/30/2013  . Atherosclerosis of native arteries of the extremities with ulceration(440.23) 06/17/2013  . Gait disorder 05/31/2013  . Weakness 05/24/2013  . Depression 04/03/2013  . GERD (gastroesophageal reflux disease) 04/03/2013  . Atrial fibrillation 04/02/2013  . DM (diabetes mellitus) 12/28/2012  . Other complications due to renal dialysis device, implant, and graft 05/19/2012  . Diabetes mellitus, type 2 11/13/2011  . Anemia 07/02/2011  . ESRD on hemodialysis 05/29/2011    Willow Ora 05/18/2014, 8:57 AM  Willow Ora, PTA, [facility] [address], [place], [place] [address] [phone] 05/18/2014, 8:57 AM

## 2014-05-23 ENCOUNTER — Ambulatory Visit: Payer: Managed Care, Other (non HMO) | Admitting: Physical Therapy

## 2014-05-25 ENCOUNTER — Encounter: Payer: Managed Care, Other (non HMO) | Admitting: Physical Therapy

## 2014-05-27 ENCOUNTER — Ambulatory Visit: Payer: Managed Care, Other (non HMO) | Admitting: Physical Therapy

## 2014-05-30 ENCOUNTER — Encounter: Payer: Managed Care, Other (non HMO) | Admitting: Physical Medicine & Rehabilitation

## 2014-05-30 ENCOUNTER — Encounter: Payer: Managed Care, Other (non HMO) | Admitting: Physical Therapy

## 2014-06-01 ENCOUNTER — Encounter: Payer: Managed Care, Other (non HMO) | Admitting: Physical Therapy

## 2014-06-03 ENCOUNTER — Ambulatory Visit: Payer: Managed Care, Other (non HMO) | Admitting: Physical Therapy

## 2014-06-06 ENCOUNTER — Encounter: Payer: Managed Care, Other (non HMO) | Admitting: Physical Therapy

## 2014-06-07 ENCOUNTER — Encounter: Payer: Self-pay | Admitting: Physical Therapy

## 2014-06-07 NOTE — Therapy (Unsigned)
[facility] [address] [place], [place], [address] Phone: [phone]   Fax:  [phone]  Patient Details  Name: Paul [name] MRN: [id number] Date of Birth: [birth year] Referring Provider:  No ref. provider found  Encounter Date: 06/07/2014  PHYSICAL THERAPY DISCHARGE SUMMARY  Visits from Start of Care: 31  Current functional level related to goals / functional outcomes:  PT LONG TERM GOAL #1    Title  Pt will be able to: Ambulate 865ft with LRAD & prostheses modified independent. (06/17/14) Last visit on 05/18/14 status: Patient ambulated up to 700' with 2 forearm crutches modified independent.   Time  2    Period  Months    Status  On-going    PT LONG TERM GOAL #2    Title  Pt will be able to: ambulate 200' on uneven (grass) surfaces with LRAD & prostheses modified independent. (06/17/14) Status in Dec 2015: Patient ambulated with 2 forearm crutches on grass with supervision.   Time  2    Period  Months    Status  On-going    PT LONG TERM GOAL #3    Title  Pt will be able to: negotiate ramp, curb, stairs with LRAD & prostheses modified independent. (06/17/14)    Time  2    Period  Months    Status  On-going    PT LONG TERM GOAL #4    Title  Pt will be able to: perform standing activities for > 20 minutes & prostheses with no pain or discomfort. (06/17/14) MET in Dec 2015   Time  2    Period  Months    Status  On-going    PT LONG TERM GOAL #5    Title  Berg Balance >/= 45/56 (Target Date: 06/17/14) NOT RETESTED with unexpected discharge   Time  2    Period  Months    Status  On-going    PT LONG TERM GOAL #6    Title  Pt will be able to: self-report via FOTO improvement >10points in Functional Status Measure (06/17/14) NOT RETESTED with unexpected discharge.   Time  2    Period  Months    Status  On-going        05/18/14 0854       Remaining deficits: This patient underwent a kidney transplant at [facility] and was discharged from PT with change in medical condition.   Education / Equipment: Patient was instructed in prosthetic care and was independent. He was instructed in progressive fitness plan & HEP and was independent to point of instruction. He received forearm crutches during episode of care. Plan: Patient agrees to discharge.  Patient goals were not met. Patient is being discharged due to a change in medical status.  ?????       Jamey Reas, PT, DPT PT Specializing in [place] 06/07/14   8:09 AM Phone:  [phone]  Fax:  [phone] [facility] [address] [place], [place] [address]  Jamey Reas 06/07/2014, 8:02 AM  [facility] [address] [place], [place], [address] Phone: [phone]   Fax:  [phone]

## 2014-06-08 ENCOUNTER — Encounter: Payer: Managed Care, Other (non HMO) | Admitting: Physical Therapy

## 2014-06-10 ENCOUNTER — Encounter: Payer: Managed Care, Other (non HMO) | Admitting: Physical Therapy

## 2014-06-13 ENCOUNTER — Encounter: Payer: Managed Care, Other (non HMO) | Admitting: Physical Therapy

## 2014-06-15 ENCOUNTER — Encounter: Payer: Managed Care, Other (non HMO) | Admitting: Physical Therapy

## 2014-06-22 ENCOUNTER — Telehealth: Payer: Self-pay | Admitting: *Deleted

## 2014-06-22 NOTE — Telephone Encounter (Signed)
Per Dr. Benay Spice; notified [name] @ [phone] re: Aranesp injection; it would be ideal if she follow up with pt's renal MD locally regarding this.  Ms. Paul [name] verbalized understanding of information.

## 2014-07-04 ENCOUNTER — Encounter (HOSPITAL_COMMUNITY): Payer: Managed Care, Other (non HMO)

## 2014-07-11 NOTE — Discharge Instructions (Signed)
Darbepoetin Alfa injection What is this medicine? DARBEPOETIN ALFA (dar be POE e tin AL fa) helps your body make more red blood cells. It is used to treat anemia caused by chronic kidney failure and chemotherapy. This medicine may be used for other purposes; ask your health care provider or pharmacist if you have questions. COMMON BRAND NAME(S): Aranesp What should I tell my health care provider before I take this medicine? They need to know if you have any of these conditions: -blood clotting disorders or history of blood clots -cancer patient not on chemotherapy -cystic fibrosis -heart disease, such as angina, heart failure, or a history of a heart attack -hemoglobin level of 12 g/dL or greater -high blood pressure -low levels of folate, iron, or vitamin B12 -seizures -an unusual or allergic reaction to darbepoetin, erythropoietin, albumin, hamster proteins, latex, other medicines, foods, dyes, or preservatives -pregnant or trying to get pregnant -breast-feeding How should I use this medicine? This medicine is for injection into a vein or under the skin. It is usually given by a health care professional in a hospital or clinic setting. If you get this medicine at home, you will be taught how to prepare and give this medicine. Do not shake the solution before you withdraw a dose. Use exactly as directed. Take your medicine at regular intervals. Do not take your medicine more often than directed. It is important that you put your used needles and syringes in a special sharps container. Do not put them in a trash can. If you do not have a sharps container, call your pharmacist or healthcare provider to get one. Talk to your pediatrician regarding the use of this medicine in children. While this medicine may be used in children as young as 1 year for selected conditions, precautions do apply. Overdosage: If you think you have taken too much of this medicine contact a poison control center or  emergency room at once. NOTE: This medicine is only for you. Do not share this medicine with others. What if I miss a dose? If you miss a dose, take it as soon as you can. If it is almost time for your next dose, take only that dose. Do not take double or extra doses. What may interact with this medicine? Do not take this medicine with any of the following medications: -epoetin alfa This list may not describe all possible interactions. Give your health care provider a list of all the medicines, herbs, non-prescription drugs, or dietary supplements you use. Also tell them if you smoke, drink alcohol, or use illegal drugs. Some items may interact with your medicine. What should I watch for while using this medicine? Visit your prescriber or health care professional for regular checks on your progress and for the needed blood tests and blood pressure measurements. It is especially important for the doctor to make sure your hemoglobin level is in the desired range, to limit the risk of potential side effects and to give you the best benefit. Keep all appointments for any recommended tests. Check your blood pressure as directed. Ask your doctor what your blood pressure should be and when you should contact him or her. As your body makes more red blood cells, you may need to take iron, folic acid, or vitamin B supplements. Ask your doctor or health care provider which products are right for you. If you have kidney disease continue dietary restrictions, even though this medication can make you feel better. Talk with your doctor or health   care professional about the foods you eat and the vitamins that you take. What side effects may I notice from receiving this medicine? Side effects that you should report to your doctor or health care professional as soon as possible: -allergic reactions like skin rash, itching or hives, swelling of the face, lips, or tongue -breathing problems -changes in vision -chest  pain -confusion, trouble speaking or understanding -feeling faint or lightheaded, falls -high blood pressure -muscle aches or pains -pain, swelling, warmth in the leg -rapid weight gain -severe headaches -sudden numbness or weakness of the face, arm or leg -trouble walking, dizziness, loss of balance or coordination -seizures (convulsions) -swelling of the ankles, feet, hands -unusually weak or tired Side effects that usually do not require medical attention (report to your doctor or health care professional if they continue or are bothersome): -diarrhea -fever, chills (flu-like symptoms) -headaches -nausea, vomiting -redness, stinging, or swelling at site where injected This list may not describe all possible side effects. Call your doctor for medical advice about side effects. You may report side effects to FDA at 1-800-FDA-1088. Where should I keep my medicine? Keep out of the reach of children. Store in a refrigerator between 2 and 8 degrees C (36 and 46 degrees F). Do not freeze. Do not shake. Throw away any unused portion if using a single-dose vial. Throw away any unused medicine after the expiration date. NOTE: This sheet is a summary. It may not cover all possible information. If you have questions about this medicine, talk to your doctor, pharmacist, or health care provider.  2015, [employer]vier/[employer]. (2008-04-19 10:23:57)  

## 2014-07-12 ENCOUNTER — Inpatient Hospital Stay (HOSPITAL_COMMUNITY)
Admission: RE | Admit: 2014-07-12 | Discharge: 2014-07-12 | Disposition: A | Discharge status: Home / Self Care | Payer: Managed Care, Other (non HMO) | Source: Ambulatory Visit | Attending: Nephrology | Admitting: Nephrology

## 2014-07-27 ENCOUNTER — Encounter: Payer: Self-pay | Admitting: Physical Therapy

## 2014-07-27 ENCOUNTER — Ambulatory Visit: Payer: Managed Care, Other (non HMO) | Attending: Family Medicine | Admitting: Physical Therapy

## 2014-07-27 DIAGNOSIS — I120 Hypertensive chronic kidney disease with stage 5 chronic kidney disease or end stage renal disease: Secondary | ICD-10-CM | POA: Diagnosis not present

## 2014-07-27 DIAGNOSIS — Z89512 Acquired absence of left leg below knee: Secondary | ICD-10-CM | POA: Diagnosis not present

## 2014-07-27 DIAGNOSIS — Z940 Kidney transplant status: Secondary | ICD-10-CM

## 2014-07-27 DIAGNOSIS — R5381 Other malaise: Secondary | ICD-10-CM | POA: Diagnosis not present

## 2014-07-27 DIAGNOSIS — R269 Unspecified abnormalities of gait and mobility: Secondary | ICD-10-CM | POA: Insufficient documentation

## 2014-07-27 DIAGNOSIS — N186 End stage renal disease: Secondary | ICD-10-CM | POA: Insufficient documentation

## 2014-07-27 DIAGNOSIS — E119 Type 2 diabetes mellitus without complications: Secondary | ICD-10-CM | POA: Diagnosis not present

## 2014-07-27 DIAGNOSIS — Z89511 Acquired absence of right leg below knee: Secondary | ICD-10-CM | POA: Insufficient documentation

## 2014-07-27 DIAGNOSIS — Z7409 Other reduced mobility: Secondary | ICD-10-CM

## 2014-07-27 DIAGNOSIS — I4891 Unspecified atrial fibrillation: Secondary | ICD-10-CM | POA: Insufficient documentation

## 2014-07-27 DIAGNOSIS — R2689 Other abnormalities of gait and mobility: Secondary | ICD-10-CM

## 2014-07-27 DIAGNOSIS — R531 Weakness: Secondary | ICD-10-CM

## 2014-07-28 NOTE — Therapy (Signed)
[facility] [address] [place], [place], [address] Phone: [phone]   Fax:  [phone]  Physical Therapy Evaluation  Patient Details  Name: Paul [name] MRN: [id number] Date of Birth: July 15, 1955 Referring Provider:  [name], MD  Encounter Date: 07/27/2014      PT End of Session - 07/28/14 1631    Visit Number 1   Number of Visits 17   Date for PT Re-Evaluation 09/23/14   PT Start Time 7026   PT Stop Time 1625   PT Time Calculation (min) 55 min   Equipment Utilized During Treatment Gait belt   Activity Tolerance Patient tolerated treatment well   Behavior During Therapy [facility] for tasks assessed/performed      Past Medical History  Diagnosis Date  . Open wound of both legs with complication     Pt has had progressive wounds of both LE's including gangrene of the toes and patchy necrosis of the calves.  He has been treated at [facility] by Dr Jerline Pain with hyperbaric O2 5d per week.  He is getting Na Thiosulfate with HD for suspected calciphylaxis. Pt says doctor's aren't sure if these ulcers were diabetic ulcers or calciphylaxis.  He underwent bilat BKA on 07/30/13.  He says that the woun  . ESRD on hemodialysis     Pt has ESRD due to DM.  He had a L upper arm AVF prior to starting HD which was ligated due to L arm steal syndrome.  He started HD in Jan 2014 and did home HD.  The family couldn't cannulate the R arm AVF successfully so by mid 2014 they decided to switch to PD which was done late summer 2014.  In Jan 2015 he was admitted with FTT felt to be due to underdialysis on PD and PD was abandoned and he   . Anemia     esrd  . Hypertension     off of meds due to orthostatic hypotension  . Closed left arm fracture 1967  . Heart murmur   . Corneal abrasion, left   . GERD (gastroesophageal reflux disease)   . History of blood transfusion   . Glomerulosclerosis, diabetic   . Depression   . A-fib   .  Calciphylaxis 05/2013    lower ext  . Diabetes mellitus     Type 1 iddm x 11 yrs  . Diabetic neuropathy   . Cancer     hx duodenal adenoCa 2002, resected    Past Surgical History  Procedure Laterality Date  . Whipple procedure  2002    duodenal ca, Dr. Harlow Asa  . Bone marrow biopsy  2012  . Renal biopsy, percutaneous  2012  . Tonsillectomy    . Portacath placement  2002  . Biliary diversion, external  2002  . Other surgical history      Cyst removed from back  . Av fistula placement  06/07/2011    Procedure: ARTERIOVENOUS (AV) FISTULA CREATION;  Surgeon: Angelia Mould, MD;  Location: [facility];  Service: Vascular;  Laterality: Left;  . Ligation of arteriovenous  fistula  05/22/2012    Procedure: LIGATION OF ARTERIOVENOUS  FISTULA;  Surgeon: Mal Misty, MD;  Location: [facility];  Service: Vascular;  Laterality: Left;  Left brachio-cephalic fistula  . Insertion of dialysis catheter  05/22/2012    Procedure: INSERTION OF DIALYSIS CATHETER;  Surgeon: Mal Misty, MD;  Location: [facility];  Service: Vascular;  Laterality: N/A;  right internal  jugular vein  . Av fistula placement  06/18/2012    Procedure: ARTERIOVENOUS (AV) FISTULA CREATION;  Surgeon: Mal Misty, MD;  Location: [facility];  Service: Vascular;  Laterality: Right;  [place]  . Colonoscopy      Hx: of  . Capd insertion N/A 01/14/2013    Procedure: LAPAROSCOPIC INSERTION CONTINUOUS AMBULATORY PERITONEAL DIALYSIS  (CAPD) CATHETER;  Surgeon: Adin Hector, MD;  Location: [facility];  Service: General;  Laterality: N/A;  . Vascular surgery    . Removal of a dialysis catheter    . Capd removal N/A 07/09/2013    Procedure: CONTINUOUS AMBULATORY PERITONEAL DIALYSIS  (CAPD) CATHETER REMOVAL;  Surgeon: Adin Hector, MD;  Location: [facility];  Service: General;  Laterality: N/A;  . Amputation Bilateral 07/30/2013    Procedure: AMPUTATION BELOW KNEE;  Surgeon: Newt Minion, MD;  Location: [facility];  Service: Orthopedics;  Laterality: Bilateral;   Bilateral Below Knee Amputations  . Amputation Bilateral 09/03/2013    Procedure: AMPUTATION BELOW KNEE;  Surgeon: Newt Minion, MD;  Location: [facility];  Service: Orthopedics;  Laterality: Bilateral;  Bilateral Below Knee Amputation Revision  . Lower extremity angiogram N/A 06/14/2013    Procedure: LOWER EXTREMITY ANGIOGRAM;  Surgeon: Angelia Mould, MD;  Location: [facility] C[facility];  Service: Cardiovascular;  Laterality: N/A;    There were no vitals filed for this visit.  Visit Diagnosis:  Abnormality of gait  S/P bilateral BKA (below knee amputation)  Weakness generalized  Balance problems  Impaired functional mobility and activity tolerance  Kidney transplant recipient      Subjective Assessment - 07/27/14 1542    Symptoms this 59yo male is familar to this PT with prosthetic training. PT was stopped as he underwent a kidney transplant on 05/23/14. He was hospitalized for 12 days. He has limited activity due to fatigue. He had a transfusion and hormone of Aironess to promote red blood cell production. He was referred to PT for evaluation.   Patient Stated Goals Return to at least strength & mobility prior to transplant and then progress to more mobility.   Currently in Pain? No/denies            [facility] PT Assessment - 07/27/14 1530    Assessment   Medical Diagnosis Kidney Transplant & Bil. Transtibial Amputations   Onset Date 05/23/14   Precautions   Precautions Fall  no lifting restrictions   Restrictions   Weight Bearing Restrictions No   Balance Screen   Has the patient fallen in the past 6 months Yes  fell back into dresser & wife had to assist up   How many times? 1   Has the patient had a decrease in activity level because of a fear of falling?  No   Is the patient reluctant to leave their home because of a fear of falling?  No   Home Environment   Living Enviornment Private residence   Living Arrangements Spouse/significant other;Children;Parent  27yo son, 40yo  mother-law   Type of [place] entrance  primary entrance ramp, 2nd 4 steps w/ 2 wide rails   Home Layout Two level;Full bath on main level;1/2 bath on main level;Able to live on main level with bedroom/bathroom  single step into living room,    Alternate Level Stairs-Number of Steps 14  extra bedrooms   Alternate Level Stairs-Rails Right;Left   Prior Function   Level of Independence Independent with basic ADLs;Independent with homemaking with ambulation;Independent  with gait;Independent with transfers   Cognition   Overall Cognitive Status Within Functional Limits for tasks assessed   Posture/Postural Control   Posture/Postural Control Postural limitations   Postural Limitations Flexed trunk;Forward head   ROM / Strength   AROM / PROM / Strength AROM;Strength   AROM   Overall AROM  Within functional limits for tasks performed   Strength   Overall Strength Deficits   Overall Strength Comments Trunk core stability fair   Strength Assessment Site Hip;Knee   Right/Left Hip Left;Right   Right Hip Flexion 4-/5   Right Hip Extension 3+/5   Right Hip ABduction 3+/5   Left Hip Flexion 4-/5   Left Hip Extension 3+/5   Left Hip ABduction 3+/5   Right/Left Knee Right;Left   Right Knee Flexion 4/5   Right Knee Extension 4/5   Left Knee Flexion 4/5   Left Knee Extension 4/5   Transfers   Sit to Stand 5: Supervision;With upper extremity assist;With armrests;From chair/3-in-1  chair stabilized against wall   Stand to Sit 5: Supervision;With upper extremity assist;With armrests;To chair/3-in-1  chair stabilized against wall   Ambulation/Gait   Ambulation/Gait Yes   Ambulation/Gait Assistance 5: Supervision;4: Min assist   Ambulation/Gait Assistance Details arrived with 2 crutches but reports first time used since kidney sg. He is using RW limited amounts in home.   Ambulation Distance (Feet) 150 Feet   Assistive device Crutches  level surfaces 2 crutches with SBA,  1 crutch with MinA   Gait Pattern Step-through pattern;Decreased step length - right;Decreased step length - left;Decreased stride length;Right hip hike;Left hip hike;Right flexed knee in stance;Left flexed knee in stance;Lateral hip instability;Wide base of support   Ambulation Surface Level;Indoor   Gait velocity 1.49 ft/sec   Stairs Yes   Stairs Assistance 5: Supervision   Stair Management Technique Two rails;Alternating pattern;Step to pattern;Forwards  ascend alternating very slow pattern, descend step-to   Number of Stairs 4   Ramp 4: Min assist   Ramp Details (indicate cue type and reason) bil. prostheses and 2 crutches   Curb 3: Mod assist   Curb Details (indicate cue type and reason) bil. prostheses and 2 crutches   Dynamic Standing Balance   Dynamic Standing - Balance Support Left upper extremity supported   Dynamic Standing - Level of Assistance 5: Stand by assistance   Dynamic Standing - Balance Activities Reaching for objects   Dynamic Standing - Comments reaches forward 4" and towards floor to knee level   Berg Balance Test   Sit to Stand Needs minimal aid to stand or to stabilize  min assist to stabilize   Standing Unsupported Needs several tries to stand 30 seconds unsupported  2 min with single UE support   Sitting with Back Unsupported but Feet Supported on Floor or Stool Able to sit safely and securely 2 minutes   Stand to Sit Sits independently, has uncontrolled descent   Transfers Able to transfer safely, definite need of hands   Standing Unsupported with Eyes Closed Needs help to keep from falling  with single UE support 10 sec with supervision   Standing Ubsupported with Feet Together Needs help to attain position and unable to hold for 15 seconds  with single UE support 60 sec with supervision   From Standing, Reach Forward with Outstretched Arm Loses balance while trying/requires external support  with single UE support reaches 4"   From Standing Position,  Pick up Object from Floor Unable to try/needs assist to keep  balance  with single UE support reaches to knee level   From Standing Position, Turn to Look Behind Over each Shoulder Needs assist to keep from losing balance and falling  with UE support rotates shoulders slightly    Turn 360 Degrees Needs assistance while turning  with 1 crutch minA, with 2 crutches SBA   Standing Unsupported, Alternately Place Feet on Step/Stool Needs assistance to keep from falling or unable to try  With 1 crutch minA, with 2 crutches SBA   Standing Unsupported, One Foot in [employer] balance while stepping or standing  with 1 crutch small step with SBA   Standing on One Leg Unable to try or needs assist to prevent fall  with 1 crutch 5 sec with SBA   Total Score 10         Prosthetics Assessment - 07/27/14 1530    Prosthetics   Prosthetic Care Independent with Skin check;Prosthetic cleaning;Ply sock cleaning  donning prostheses   Prosthetic Care Dependent with Residual limb care;Correct ply sock adjustment;Proper wear schedule/adjustment;Proper weight-bearing schedule/adjustment   Donning prosthesis  Modified independent (Device/Increase time)   Doffing prosthesis  Modified independent (Device/Increase time)   Current prosthetic wear tolerance (days/week)  7 days/wk   Current prosthetic wear tolerance (#hours/day)  reports wear when out of bed but only out of bed ~8 hours/day   Current prosthetic weight-bearing tolerance (hours/day)  tolerated standing 5 minutes without pain but fatigue   Edema none   Residual limb condition  intact but no ply socks and too deep in socket with 13# wt loss                  [facility] Adult PT Treatment/Exercise - 07/27/14 1530    Prosthetics   Education Provided Residual limb care;Proper wear schedule/adjustment;Correct ply sock adjustment  call prosthetist for pads   Person(s) Educated Patient;Spouse   Education Method Explanation   Education Method  Verbalized understanding                PT Education - 07/27/14 1530    Education provided Yes   Education Details see prosthetic care, increasing activity level with increased frequency of gait first, using RW for now to go out in community due to assistance on ramp & curb along with endurance /fatigue factor   Person(s) Educated Patient;Spouse   Methods Explanation   Comprehension Verbalized understanding;Need further instruction          PT Short Term Goals - 07/28/14 1645    PT SHORT TERM GOAL #1   Title Pt will be able to: Ambulate 500 ft. with forearm crutches and prostheses with supervision. (Target Date: 08/26/2014)   Time 1   Period Months   Status New   PT SHORT TERM GOAL #2   Title Patient will be able to ambulate 51' around furniture with 1 crutch & prostheses with supervision. (Target Date: 08/26/2014)   Time 1   Period Months   Status New   PT SHORT TERM GOAL #3   Title Pt will be at to:  Perform standing activities for >15 minutes with prostheses with minimal c/o fatigue. (Target Date: 08/26/2014)   Time 1   Period Months   Status New   PT SHORT TERM GOAL #4   Title Pt will be able to reach 10" & within 5" of floor with support of 1 crutch with supervision. (Target Date: 08/26/2014)   Time 1   Period Months   Status New   PT  SHORT TERM GOAL #5   Title Pt will be able to: negotiate ramp & curb with forearm crutches & prostheses with supervision. (Target Date: 08/26/2014)   Time 1   Period Months   Status New           PT Long Term Goals - 07/27/14 1630    PT LONG TERM GOAL #1   Title Pt will be able to: Ambulate 829ft with LRAD & prostheses modified independent. (Target Date: 10/14/2014)   Time 12   Period Weeks   Status New   PT LONG TERM GOAL #2   Title Pt will be able to: ambulate 200' on uneven (grass) surfaces with LRAD & prostheses modified independent.  (Target Date: 10/14/2014)   Time 12   Period Weeks   Status New   PT LONG TERM GOAL #3    Title Pt will be able to: negotiate ramp, curb, stairs with LRAD & prostheses modified independent.  (Target Date: 10/14/2014)   Time 12   Period Weeks   Status New   PT LONG TERM GOAL #4   Title Pt will be able to: perform standing activities for > 20 minutes & prostheses with no pain or discomfort.  (Target Date: 10/14/2014)   Time 12   Period Weeks   Status New   PT LONG TERM GOAL #5   Title Berg Balance >/= 45/56 (Target Date: 10/14/2014)   Time 12   Period Weeks   Status New   PT LONG TERM GOAL #6   Title Patient will ambulate in home simulated (100' and single step) with 1 crutch or less restrictive device modified independent. (Target Date: 10/14/2014)   Time 12   Period Weeks   Status New               Plan - 07/28/14 1632    Clinical Impression Statement This patient was undergoing prosthetic training over last few months of 2015. He underwent a kidney transplant 05/23/14. He has not been able to be active for 9 weeks and lost 13# (appears lot is muscle). His balance, gait and activity tolerance has declined over those 9 weeks.          Pt will benefit from skilled therapeutic intervention in order to improve on the following deficits Abnormal gait;Decreased activity tolerance;Decreased balance;Decreased endurance;Decreased mobility;Decreased strength   Rehab Potential Good   PT Frequency 2x / week   PT Duration 12 weeks   PT Treatment/Interventions ADLs/Self Care Home Management;Gait training;Stair training;Functional mobility training;Therapeutic activities;Therapeutic exercise;Balance training;Neuromuscular re-education;Patient/family education   PT Next Visit Plan HEP to increase activity level & balance   Consulted and Agree with Plan of Care Patient;Family member/caregiver   Family Member Consulted wife         Problem List Patient Active Problem List   Diagnosis Date Noted  . Abnormal cardiovascular stress test 12/29/2013  . Awaiting organ transplant  12/09/2013  . Leg ulcer 11/17/2013  . Chronic kidney disease (CKD), stage V 11/17/2013  . Diabetes 11/17/2013  . Cancer of duodenum 11/17/2013  . Dyslipidemia 11/17/2013  . Chronic kidney disease requiring chronic dialysis 11/17/2013  . Hypercholesteremia 11/17/2013  . H/O malignant neoplasm 11/17/2013  . H/O: HTN (hypertension) 11/17/2013  . BP (high blood pressure) 11/17/2013  . Hypotension 11/17/2013  . Angiopathy, peripheral 11/17/2013  . AF (paroxysmal atrial fibrillation) 11/17/2013  . BKA stump complication 53/64/6803  . S/P bilateral BKA (below knee amputation) 08/03/2013  . S/P BKA (below knee amputation) bilateral 07/30/2013  .  Open wound of both legs with complication 36/85/9923  . Acute blood loss anemia 07/12/2013  . Syncope 07/09/2013  . Intolerance to CAPD peritoneal dialysis s/p CAPD cath removal 07/09/2013 07/05/2013  . Critical lower limb ischemia 06/30/2013  . Extremity pain 06/30/2013  . Atherosclerosis of native arteries of the extremities with ulceration(440.23) 06/17/2013  . Gait disorder 05/31/2013  . Weakness 05/24/2013  . Depression 04/03/2013  . GERD (gastroesophageal reflux disease) 04/03/2013  . Atrial fibrillation 04/02/2013  . DM (diabetes mellitus) 12/28/2012  . Other complications due to renal dialysis device, implant, and graft 05/19/2012  . Diabetes mellitus, type 2 11/13/2011  . Anemia 07/02/2011  . ESRD on hemodialysis 05/29/2011    Jamey Reas PT, DPT 07/28/2014, 5:07 PM  [facility] [address] [place], [place], [address] Phone: [phone]   Fax:  [phone]

## 2014-07-29 ENCOUNTER — Encounter: Payer: Self-pay | Admitting: Physical Therapy

## 2014-07-29 ENCOUNTER — Ambulatory Visit: Payer: Managed Care, Other (non HMO) | Admitting: Physical Therapy

## 2014-07-29 DIAGNOSIS — R269 Unspecified abnormalities of gait and mobility: Secondary | ICD-10-CM | POA: Diagnosis not present

## 2014-07-29 DIAGNOSIS — Z940 Kidney transplant status: Secondary | ICD-10-CM

## 2014-07-29 DIAGNOSIS — Z7409 Other reduced mobility: Secondary | ICD-10-CM

## 2014-07-29 DIAGNOSIS — Z89511 Acquired absence of right leg below knee: Secondary | ICD-10-CM

## 2014-07-29 DIAGNOSIS — R2689 Other abnormalities of gait and mobility: Secondary | ICD-10-CM

## 2014-07-29 DIAGNOSIS — R531 Weakness: Secondary | ICD-10-CM

## 2014-07-29 DIAGNOSIS — Z89512 Acquired absence of left leg below knee: Secondary | ICD-10-CM

## 2014-07-29 NOTE — Therapy (Signed)
[facility] [address] [place], [place], [address] Phone: [phone]   Fax:  [phone]  Physical Therapy Treatment  Patient Details  Name: Paul [name] MRN: [id number] Date of Birth: June 02, 1955 Referring Provider:  [name], MD  Encounter Date: 07/29/2014      PT End of Session - 07/29/14 1100    Visit Number 2   Number of Visits 17   Date for PT Re-Evaluation 09/23/14   PT Start Time 1015   PT Stop Time 1055   PT Time Calculation (min) 40 min   Equipment Utilized During Treatment Gait belt   Activity Tolerance Patient tolerated treatment well   Behavior During Therapy [facility] for tasks assessed/performed      Past Medical History  Diagnosis Date  . Open wound of both legs with complication     Pt has had progressive wounds of both LE's including gangrene of the toes and patchy necrosis of the calves.  He has been treated at [facility] by Dr Jerline Pain with hyperbaric O2 5d per week.  He is getting Na Thiosulfate with HD for suspected calciphylaxis. Pt says doctor's aren't sure if these ulcers were diabetic ulcers or calciphylaxis.  He underwent bilat BKA on 07/30/13.  He says that the woun  . ESRD on hemodialysis     Pt has ESRD due to DM.  He had a L upper arm AVF prior to starting HD which was ligated due to L arm steal syndrome.  He started HD in Jan 2014 and did home HD.  The family couldn't cannulate the R arm AVF successfully so by mid 2014 they decided to switch to PD which was done late summer 2014.  In Jan 2015 he was admitted with FTT felt to be due to underdialysis on PD and PD was abandoned and he   . Anemia     esrd  . Hypertension     off of meds due to orthostatic hypotension  . Closed left arm fracture 1967  . Heart murmur   . Corneal abrasion, left   . GERD (gastroesophageal reflux disease)   . History of blood transfusion   . Glomerulosclerosis, diabetic   . Depression   . A-fib   .  Calciphylaxis 05/2013    lower ext  . Diabetes mellitus     Type 1 iddm x 11 yrs  . Diabetic neuropathy   . Cancer     hx duodenal adenoCa 2002, resected    Past Surgical History  Procedure Laterality Date  . Whipple procedure  2002    duodenal ca, Dr. Harlow Asa  . Bone marrow biopsy  2012  . Renal biopsy, percutaneous  2012  . Tonsillectomy    . Portacath placement  2002  . Biliary diversion, external  2002  . Other surgical history      Cyst removed from back  . Av fistula placement  06/07/2011    Procedure: ARTERIOVENOUS (AV) FISTULA CREATION;  Surgeon: Angelia Mould, MD;  Location: [facility];  Service: Vascular;  Laterality: Left;  . Ligation of arteriovenous  fistula  05/22/2012    Procedure: LIGATION OF ARTERIOVENOUS  FISTULA;  Surgeon: Mal Misty, MD;  Location: [facility];  Service: Vascular;  Laterality: Left;  Left brachio-cephalic fistula  . Insertion of dialysis catheter  05/22/2012    Procedure: INSERTION OF DIALYSIS CATHETER;  Surgeon: Mal Misty, MD;  Location: [place];  Service: Vascular;  Laterality: N/A;  right internal  jugular vein  . Av fistula placement  06/18/2012    Procedure: ARTERIOVENOUS (AV) FISTULA CREATION;  Surgeon: Mal Misty, MD;  Location: [place];  Service: Vascular;  Laterality: Right;  [place]  . Colonoscopy      Hx: of  . Capd insertion N/A 01/14/2013    Procedure: LAPAROSCOPIC INSERTION CONTINUOUS AMBULATORY PERITONEAL DIALYSIS  (CAPD) CATHETER;  Surgeon: Adin Hector, MD;  Location: [facility];  Service: General;  Laterality: N/A;  . Vascular surgery    . Removal of a dialysis catheter    . Capd removal N/A 07/09/2013    Procedure: CONTINUOUS AMBULATORY PERITONEAL DIALYSIS  (CAPD) CATHETER REMOVAL;  Surgeon: Adin Hector, MD;  Location: [place];  Service: General;  Laterality: N/A;  . Amputation Bilateral 07/30/2013    Procedure: AMPUTATION BELOW KNEE;  Surgeon: Newt Minion, MD;  Location: [place] [facility];  Service: Orthopedics;  Laterality: Bilateral;   Bilateral Below Knee Amputations  . Amputation Bilateral 09/03/2013    Procedure: AMPUTATION BELOW KNEE;  Surgeon: Newt Minion, MD;  Location: [facility];  Service: Orthopedics;  Laterality: Bilateral;  Bilateral Below Knee Amputation Revision  . Lower extremity angiogram N/A 06/14/2013    Procedure: LOWER EXTREMITY ANGIOGRAM;  Surgeon: Angelia Mould, MD;  Location: [facility] CATH [facility];  Service: Cardiovascular;  Laterality: N/A;    There were no vitals filed for this visit.  Visit Diagnosis:  Abnormality of gait  S/P bilateral BKA (below knee amputation)  Weakness generalized  Balance problems  Impaired functional mobility and activity tolerance  Kidney transplant recipient      Subjective Assessment - 07/29/14 1026    Symptoms No issues since evaluation.   Currently in Pain? No/denies     Prosthetic Training: Patient ambulated 32' with forearm crutches / prostheses with supervision in 6 minutes. Patient neg 4" block with RW alternating lead leg 4 reps each leg leading with SBA. At counter side steps on 4" block with SBA. Patient ambulated 250' with forearm crutches to car with SBA.   Patient reported fatigue with above activities but also had a busy day yesterday at [facility].                          PT Education - 07/29/14 1059    Education provided Yes   Education Details 4"   block step ups forward with RW and sideways with counter building reps to tolerance   Person(s) Educated Patient;Spouse   Methods Explanation;Demonstration   Comprehension Verbalized understanding;Returned demonstration          PT Short Term Goals - 07/28/14 1645    PT SHORT TERM GOAL #1   Title Pt will be able to: Ambulate 500 ft. with forearm crutches and prostheses with supervision. (Target Date: 08/26/2014)   Time 1   Period Months   Status New   PT SHORT TERM GOAL #2   Title Patient will be able to ambulate 65' around furniture with 1 crutch & prostheses with  supervision. (Target Date: 08/26/2014)   Time 1   Period Months   Status New   PT SHORT TERM GOAL #3   Title Pt will be at to:  Perform standing activities for >15 minutes with prostheses with minimal c/o fatigue. (Target Date: 08/26/2014)   Time 1   Period Months   Status New   PT SHORT TERM GOAL #4   Title Pt will be able to reach 10" & within 5"  of floor with support of 1 crutch with supervision. (Target Date: 08/26/2014)   Time 1   Period Months   Status New   PT SHORT TERM GOAL #5   Title Pt will be able to: negotiate ramp & curb with forearm crutches & prostheses with supervision. (Target Date: 08/26/2014)   Time 1   Period Months   Status New           PT Long Term Goals - 07/27/14 1630    PT LONG TERM GOAL #1   Title Pt will be able to: Ambulate 861ft with LRAD & prostheses modified independent. (Target Date: 10/14/2014)   Time 12   Period Weeks   Status New   PT LONG TERM GOAL #2   Title Pt will be able to: ambulate 200' on uneven (grass) surfaces with LRAD & prostheses modified independent.  (Target Date: 10/14/2014)   Time 12   Period Weeks   Status New   PT LONG TERM GOAL #3   Title Pt will be able to: negotiate ramp, curb, stairs with LRAD & prostheses modified independent.  (Target Date: 10/14/2014)   Time 12   Period Weeks   Status New   PT LONG TERM GOAL #4   Title Pt will be able to: perform standing activities for > 20 minutes & prostheses with no pain or discomfort.  (Target Date: 10/14/2014)   Time 12   Period Weeks   Status New   PT LONG TERM GOAL #5   Title Berg Balance >/= 45/56 (Target Date: 10/14/2014)   Time 12   Period Weeks   Status New   PT LONG TERM GOAL #6   Title Patient will ambulate in home simulated (100' and single step) with 1 crutch or less restrictive device modified independent. (Target Date: 10/14/2014)   Time 12   Period Weeks   Status New               Plan - 07/29/14 1100    Clinical Impression Statement Patient fatigues  easily. He requires frequent rest brakes.   Pt will benefit from skilled therapeutic intervention in order to improve on the following deficits Abnormal gait;Decreased activity tolerance;Decreased balance;Decreased endurance;Decreased mobility;Decreased strength   Rehab Potential Good   PT Frequency 2x / week   PT Duration 12 weeks   PT Treatment/Interventions ADLs/Self Care Home Management;Gait training;Stair training;Functional mobility training;Therapeutic activities;Therapeutic exercise;Balance training;Neuromuscular re-education;Patient/family education   PT Next Visit Plan HEP to increase activity level & balance   Consulted and Agree with Plan of Care Patient;Family member/caregiver   Family Member Consulted wife        Problem List Patient Active Problem List   Diagnosis Date Noted  . Abnormal cardiovascular stress test 12/29/2013  . Awaiting organ transplant 12/09/2013  . Leg ulcer 11/17/2013  . Chronic kidney disease (CKD), stage V 11/17/2013  . Diabetes 11/17/2013  . Cancer of duodenum 11/17/2013  . Dyslipidemia 11/17/2013  . Chronic kidney disease requiring chronic dialysis 11/17/2013  . Hypercholesteremia 11/17/2013  . H/O malignant neoplasm 11/17/2013  . H/O: HTN (hypertension) 11/17/2013  . BP (high blood pressure) 11/17/2013  . Hypotension 11/17/2013  . Angiopathy, peripheral 11/17/2013  . AF (paroxysmal atrial fibrillation) 11/17/2013  . BKA stump complication 35/32/9924  . S/P bilateral BKA (below knee amputation) 08/03/2013  . S/P BKA (below knee amputation) bilateral 07/30/2013  . Open wound of both legs with complication 26/83/4196  . Acute blood loss anemia 07/12/2013  . Syncope 07/09/2013  .  Intolerance to CAPD peritoneal dialysis s/p CAPD cath removal 07/09/2013 07/05/2013  . Critical lower limb ischemia 06/30/2013  . Extremity pain 06/30/2013  . Atherosclerosis of native arteries of the extremities with ulceration(440.23) 06/17/2013  . Gait disorder  05/31/2013  . Weakness 05/24/2013  . Depression 04/03/2013  . GERD (gastroesophageal reflux disease) 04/03/2013  . Atrial fibrillation 04/02/2013  . DM (diabetes mellitus) 12/28/2012  . Other complications due to renal dialysis device, implant, and graft 05/19/2012  . Diabetes mellitus, type 2 11/13/2011  . Anemia 07/02/2011  . ESRD on hemodialysis 05/29/2011    Jamey Reas PT, DPT 07/29/2014, 11:02 AM  [facility] [address] [place], [place], [address] Phone: [phone]   Fax:  [phone]

## 2014-08-02 ENCOUNTER — Encounter: Payer: Managed Care, Other (non HMO) | Admitting: Physical Therapy

## 2014-08-02 ENCOUNTER — Ambulatory Visit: Payer: Managed Care, Other (non HMO) | Admitting: Physical Therapy

## 2014-08-02 ENCOUNTER — Emergency Department (HOSPITAL_COMMUNITY)
Admission: EM | Admit: 2014-08-02 | Discharge: 2014-08-02 | Disposition: A | Discharge status: Other Acute Inpatient Hospital | Payer: Managed Care, Other (non HMO) | Attending: Emergency Medicine | Admitting: Emergency Medicine

## 2014-08-02 ENCOUNTER — Emergency Department (HOSPITAL_COMMUNITY): Payer: Managed Care, Other (non HMO)

## 2014-08-02 ENCOUNTER — Encounter (HOSPITAL_COMMUNITY): Payer: Self-pay | Admitting: Emergency Medicine

## 2014-08-02 DIAGNOSIS — R079 Chest pain, unspecified: Secondary | ICD-10-CM | POA: Insufficient documentation

## 2014-08-02 DIAGNOSIS — Z79899 Other long term (current) drug therapy: Secondary | ICD-10-CM | POA: Insufficient documentation

## 2014-08-02 DIAGNOSIS — K219 Gastro-esophageal reflux disease without esophagitis: Secondary | ICD-10-CM | POA: Diagnosis not present

## 2014-08-02 DIAGNOSIS — Z794 Long term (current) use of insulin: Secondary | ICD-10-CM | POA: Diagnosis not present

## 2014-08-02 DIAGNOSIS — Z89512 Acquired absence of left leg below knee: Secondary | ICD-10-CM | POA: Diagnosis not present

## 2014-08-02 DIAGNOSIS — Z87828 Personal history of other (healed) physical injury and trauma: Secondary | ICD-10-CM | POA: Diagnosis not present

## 2014-08-02 DIAGNOSIS — D649 Anemia, unspecified: Secondary | ICD-10-CM | POA: Insufficient documentation

## 2014-08-02 DIAGNOSIS — T8611 Kidney transplant rejection: Secondary | ICD-10-CM | POA: Diagnosis not present

## 2014-08-02 DIAGNOSIS — Y830 Surgical operation with transplant of whole organ as the cause of abnormal reaction of the patient, or of later complication, without mention of misadventure at the time of the procedure: Secondary | ICD-10-CM | POA: Diagnosis not present

## 2014-08-02 DIAGNOSIS — Z7952 Long term (current) use of systemic steroids: Secondary | ICD-10-CM | POA: Diagnosis not present

## 2014-08-02 DIAGNOSIS — R011 Cardiac murmur, unspecified: Secondary | ICD-10-CM | POA: Insufficient documentation

## 2014-08-02 DIAGNOSIS — Z8781 Personal history of (healed) traumatic fracture: Secondary | ICD-10-CM | POA: Insufficient documentation

## 2014-08-02 DIAGNOSIS — E109 Type 1 diabetes mellitus without complications: Secondary | ICD-10-CM | POA: Diagnosis not present

## 2014-08-02 DIAGNOSIS — Z89511 Acquired absence of right leg below knee: Secondary | ICD-10-CM | POA: Diagnosis not present

## 2014-08-02 DIAGNOSIS — N186 End stage renal disease: Secondary | ICD-10-CM | POA: Insufficient documentation

## 2014-08-02 DIAGNOSIS — D696 Thrombocytopenia, unspecified: Secondary | ICD-10-CM | POA: Insufficient documentation

## 2014-08-02 DIAGNOSIS — Z992 Dependence on renal dialysis: Secondary | ICD-10-CM | POA: Diagnosis not present

## 2014-08-02 DIAGNOSIS — Z7982 Long term (current) use of aspirin: Secondary | ICD-10-CM | POA: Insufficient documentation

## 2014-08-02 DIAGNOSIS — R531 Weakness: Secondary | ICD-10-CM | POA: Diagnosis present

## 2014-08-02 DIAGNOSIS — I120 Hypertensive chronic kidney disease with stage 5 chronic kidney disease or end stage renal disease: Secondary | ICD-10-CM | POA: Diagnosis not present

## 2014-08-02 DIAGNOSIS — Z85068 Personal history of other malignant neoplasm of small intestine: Secondary | ICD-10-CM | POA: Insufficient documentation

## 2014-08-02 DIAGNOSIS — Z8659 Personal history of other mental and behavioral disorders: Secondary | ICD-10-CM | POA: Insufficient documentation

## 2014-08-02 LAB — URINE MICROSCOPIC-ADD ON

## 2014-08-02 LAB — CBC
HCT: 25.8 % — ABNORMAL LOW (ref 39.0–52.0)
Hemoglobin: 8.9 g/dL — ABNORMAL LOW (ref 13.0–17.0)
MCH: 31.4 pg (ref 26.0–34.0)
MCHC: 34.5 g/dL (ref 30.0–36.0)
MCV: 91.2 fL (ref 78.0–100.0)
PLATELETS: 61 10*3/uL — AB (ref 150–400)
RBC: 2.83 MIL/uL — ABNORMAL LOW (ref 4.22–5.81)
RDW: 15.9 % — AB (ref 11.5–15.5)
WBC: 12.6 10*3/uL — AB (ref 4.0–10.5)

## 2014-08-02 LAB — URINALYSIS, ROUTINE W REFLEX MICROSCOPIC
Glucose, UA: NEGATIVE mg/dL
Ketones, ur: NEGATIVE mg/dL
Nitrite: NEGATIVE
Protein, ur: 30 mg/dL — AB
SPECIFIC GRAVITY, URINE: 1.017 (ref 1.005–1.030)
UROBILINOGEN UA: 0.2 mg/dL (ref 0.0–1.0)
pH: 5 (ref 5.0–8.0)

## 2014-08-02 LAB — CBG MONITORING, ED: GLUCOSE-CAPILLARY: 238 mg/dL — AB (ref 70–99)

## 2014-08-02 LAB — BASIC METABOLIC PANEL
ANION GAP: 11 (ref 5–15)
BUN: 108 mg/dL — ABNORMAL HIGH (ref 6–23)
CALCIUM: 8 mg/dL — AB (ref 8.4–10.5)
CO2: 16 mmol/L — ABNORMAL LOW (ref 19–32)
CREATININE: 5.23 mg/dL — AB (ref 0.50–1.35)
Chloride: 102 mmol/L (ref 96–112)
GFR, EST AFRICAN AMERICAN: 13 mL/min — AB (ref >90)
GFR, EST NON AFRICAN AMERICAN: 11 mL/min — AB (ref >90)
Glucose, Bld: 255 mg/dL — ABNORMAL HIGH (ref 70–99)
Potassium: 5.3 mmol/L — ABNORMAL HIGH (ref 3.5–5.1)
Sodium: 129 mmol/L — ABNORMAL LOW (ref 135–145)

## 2014-08-02 LAB — I-STAT CG4 LACTIC ACID, ED: Lactic Acid, Venous: 1.45 mmol/L (ref 0.5–2.0)

## 2014-08-02 MED ORDER — DEXTROSE 5 % IV SOLN
1.0000 g | Freq: Once | INTRAVENOUS | Status: AC
  Administered 2014-08-02: 1 g via INTRAVENOUS
  Filled 2014-08-02: qty 10

## 2014-08-02 MED ORDER — SODIUM CHLORIDE 0.9 % IV BOLUS (SEPSIS)
500.0000 mL | Freq: Once | INTRAVENOUS | Status: AC
  Administered 2014-08-02: 500 mL via INTRAVENOUS

## 2014-08-02 NOTE — ED Notes (Signed)
Patient states kidney transplant in January.   Patient states he has done pretty good since transplant, but stays chronically anemic from the antirejection medication.   Patient states he feels like he does when his hemoglobin drops.   No neuro deficits.  Patient states feels "lethargic since last week".   Patient states he just feels listless.   Transplant coordinator and they advised to come here for treatment.

## 2014-08-02 NOTE — ED Notes (Signed)
Notified [facility] for transportation to [facility] ED

## 2014-08-02 NOTE — ED Provider Notes (Signed)
CSN: [id number]     Arrival date & time 08/02/14  1147 History   First MD Initiated Contact with Patient 08/02/14 1235     Chief Complaint  Patient presents with  . Fatigue  . Weakness     (Consider location/radiation/quality/duration/timing/severity/associated sxs/prior Treatment) HPI Pt is a 59yo male s/p kidney transplant in January 2016 at [facility], paroxysmal a-fib, HTN, anemia due to taking his antirejection medication, pt reports having a blood transfusion at [place] 4 weeks ago due to his anemia.  Pt also has hx of Type 1 IDDM and bilateral BKA.  Presenting to ED [place] with 4 day hx of gradually worsening generalized weakness and fatigue as well as nausea. Pt states he was in the bathroom Saturday night but was too weak to get off the toilet w/o the help of his son.  His son and wife picked him up but in doing so, "cracked" a rib in his left side.  Pt states he feels like he needs another blood transfusion.  Reports vomiting once yesterday but no blood.  Denies blood in urine or stool.  Denies fever or chills. BP in triage was 94/54.  Pt states his systolic is usually in 374M.  States he has had a decreased appetite.  He has been taking his medication as prescribed.  Wife states she called the Kidney Coordinator at [facility], who recommended pt be taken to closes ED, then have ED staff consult once results of lab work come back.    Past Medical History  Diagnosis Date  . Open wound of both legs with complication     Pt has had progressive wounds of both LE's including gangrene of the toes and patchy necrosis of the calves.  He has been treated at [facility] by Dr Jerline Pain with hyperbaric O2 5d per week.  He is getting Na Thiosulfate with HD for suspected calciphylaxis. Pt says doctor's aren't sure if these ulcers were diabetic ulcers or calciphylaxis.  He underwent bilat BKA on 07/30/13.  He says that the woun  . ESRD on hemodialysis     Pt has ESRD due to DM.  He had a L upper arm AVF  prior to starting HD which was ligated due to L arm steal syndrome.  He started HD in Jan 2014 and did home HD.  The family couldn't cannulate the R arm AVF successfully so by mid 2014 they decided to switch to PD which was done late summer 2014.  In Jan 2015 he was admitted with FTT felt to be due to underdialysis on PD and PD was abandoned and he   . Anemia     esrd  . Hypertension     off of meds due to orthostatic hypotension  . Closed left arm fracture 1967  . Heart murmur   . Corneal abrasion, left   . GERD (gastroesophageal reflux disease)   . History of blood transfusion   . Glomerulosclerosis, diabetic   . Depression   . A-fib   . Calciphylaxis 05/2013    lower ext  . Diabetes mellitus     Type 1 iddm x 11 yrs  . Diabetic neuropathy   . Cancer     hx duodenal adenoCa 2002, resected   Past Surgical History  Procedure Laterality Date  . Whipple procedure  2002    duodenal ca, Dr. Harlow Asa  . Bone marrow biopsy  2012  . Renal biopsy, percutaneous  2012  . Tonsillectomy    . Portacath  placement  2002  . Biliary diversion, external  2002  . Other surgical history      Cyst removed from back  . Av fistula placement  06/07/2011    Procedure: ARTERIOVENOUS (AV) FISTULA CREATION;  Surgeon: Angelia Mould, MD;  Location: [place];  Service: Vascular;  Laterality: Left;  . Ligation of arteriovenous  fistula  05/22/2012    Procedure: LIGATION OF ARTERIOVENOUS  FISTULA;  Surgeon: Mal Misty, MD;  Location: [place];  Service: Vascular;  Laterality: Left;  Left brachio-cephalic fistula  . Insertion of dialysis catheter  05/22/2012    Procedure: INSERTION OF DIALYSIS CATHETER;  Surgeon: Mal Misty, MD;  Location: [place];  Service: Vascular;  Laterality: N/A;  right internal jugular vein  . Av fistula placement  06/18/2012    Procedure: ARTERIOVENOUS (AV) FISTULA CREATION;  Surgeon: Mal Misty, MD;  Location: [place];  Service: Vascular;  Laterality: Right;  [place]  . Colonoscopy       Hx: of  . Capd insertion N/A 01/14/2013    Procedure: LAPAROSCOPIC INSERTION CONTINUOUS AMBULATORY PERITONEAL DIALYSIS  (CAPD) CATHETER;  Surgeon: Adin Hector, MD;  Location: [place];  Service: General;  Laterality: N/A;  . Vascular surgery    . Removal of a dialysis catheter    . Capd removal N/A 07/09/2013    Procedure: CONTINUOUS AMBULATORY PERITONEAL DIALYSIS  (CAPD) CATHETER REMOVAL;  Surgeon: Adin Hector, MD;  Location: [place];  Service: General;  Laterality: N/A;  . Amputation Bilateral 07/30/2013    Procedure: AMPUTATION BELOW KNEE;  Surgeon: Newt Minion, MD;  Location: [place];  Service: Orthopedics;  Laterality: Bilateral;  Bilateral Below Knee Amputations  . Amputation Bilateral 09/03/2013    Procedure: AMPUTATION BELOW KNEE;  Surgeon: Newt Minion, MD;  Location: [place];  Service: Orthopedics;  Laterality: Bilateral;  Bilateral Below Knee Amputation Revision  . Lower extremity angiogram N/A 06/14/2013    Procedure: LOWER EXTREMITY ANGIOGRAM;  Surgeon: Angelia Mould, MD;  Location: [facility] CATH LAB;  Service: Cardiovascular;  Laterality: N/A;  . Nephrectomy transplanted organ     Family History  Problem Relation Age of Onset  . Cancer Mother   . Cancer Father     prostate cancer  . Heart disease Maternal Grandfather   . Stroke Paternal Grandmother    History  Substance Use Topics  . Smoking status: Never Smoker   . Smokeless tobacco: Never Used  . Alcohol Use: No    Review of Systems  Constitutional: Positive for appetite change and fatigue. Negative for fever and chills.  Respiratory: Negative for cough and shortness of breath.   Cardiovascular: Positive for chest pain ( left side, "rib pain" ). Negative for palpitations and leg swelling.  Gastrointestinal: Positive for nausea. Negative for vomiting, abdominal pain, diarrhea and blood in stool.  Genitourinary: Negative for dysuria and hematuria.  Musculoskeletal: Negative for myalgias and back pain.   Neurological: Positive for weakness ( generalized).  All other systems reviewed and are negative.     Allergies  Metformin and related  Home Medications   Prior to Admission medications   Medication Sig Start Date End Date Taking? Authorizing Provider  acyclovir (ZOVIRAX) 400 MG tablet Take 400 mg by mouth 2 (two) times daily.    Yes Historical Provider, MD  aspirin EC 81 MG tablet Take 81 mg by mouth daily.   Yes Historical Provider, MD  insulin glargine (LANTUS) 100 UNIT/ML injection Inject 14 Units into the  skin at bedtime.    Yes Historical Provider, MD  insulin lispro (HUMALOG) 100 UNIT/ML injection Inject 4 Units into the skin 3 (three) times daily before meals.   Yes Historical Provider, MD  magnesium oxide (MAG-OX) 400 MG tablet Take 800 mg by mouth 2 (two) times daily.    Yes Historical Provider, MD  metoCLOPramide (REGLAN) 5 MG tablet Take 5 mg by mouth 3 (three) times daily before meals.   Yes Historical Provider, MD  mycophenolate (MYFORTIC) 180 MG EC tablet Take 180 mg by mouth 2 (two) times daily.   Yes Historical Provider, MD  mycophenolate (MYFORTIC) 360 MG TBEC EC tablet Take 360 mg by mouth 2 (two) times daily.   Yes Historical Provider, MD  omeprazole (PRILOSEC) 40 MG capsule  01/11/14  Yes Historical Provider, MD  predniSONE (DELTASONE) 5 MG tablet Take 20 mg by mouth daily with breakfast.   Yes Historical Provider, MD  sulfamethoxazole-trimethoprim (BACTRIM DS,SEPTRA DS) 800-160 MG per tablet Take 1 tablet by mouth 3 (three) times a week.   Yes Historical Provider, MD  tacrolimus (PROGRAF) 5 MG capsule Take 6 mg by mouth 2 (two) times daily.   Yes Historical Provider, MD  tamsulosin (FLOMAX) 0.4 MG CAPS capsule Take 0.4 mg by mouth daily after breakfast.    Yes Historical Provider, MD  aspirin EC 325 MG EC tablet Take 1 tablet (325 mg total) by mouth daily. 08/13/13   Lavon Paganini Angiulli, PA-C  atorvastatin (LIPITOR) 10 MG tablet Take 1 tablet (10 mg total) by mouth  daily. Patient not taking: Reported on 07/29/2014 08/13/13   Lavon Paganini Angiulli, PA-C  multivitamin (RENA-VIT) TABS tablet Take 1 tablet by mouth at bedtime. Patient not taking: Reported on 07/29/2014 08/13/13   Lavon Paganini Angiulli, PA-C   BP 118/68 mmHg  Pulse 84  Temp(Src) 98.4 F (36.9 C) (Oral)  Resp 24  Ht 6' (1.829 m)  Wt 170 lb (77.111 kg)  BMI 23.05 kg/m2  SpO2 99% Physical Exam  Constitutional: He is oriented to person, place, and time. He appears well-developed and well-nourished.  Pt lying in exam bed, alert but appears mildly fatigued   HENT:  Head: Normocephalic and atraumatic.  Eyes: Conjunctivae are normal. No scleral icterus.  Neck: Normal range of motion.  Cardiovascular: Normal rate, regular rhythm and normal heart sounds.   Regular rate and rhythm  Pulmonary/Chest: Effort normal and breath sounds normal. No respiratory distress. He has no wheezes. He has no rales. He exhibits tenderness ( over left lateral ribs. no crepitus or deformity.).  No respiratory distress, able to speak in full sentences w/o difficulty. Lungs: CTAB   Abdominal: Soft. Bowel sounds are normal. He exhibits no distension and no mass. There is no tenderness. There is no rebound and no guarding.  Musculoskeletal: He exhibits no edema or tenderness.  Bilateral BKA, prosthesis in place    Neurological: He is alert and oriented to person, place, and time.  Alert to person, place and time. Speech is clear.  Did not ambulate pt as unsafe with pt being so weak  Skin: Skin is warm and dry. No erythema.  Left chest wall: no ecchymosis or erythema. Skin in tact. No rash.   Nursing note and vitals reviewed.   ED Course  Procedures (including critical care time) Labs Review Labs Reviewed  CBC - Abnormal; Notable for the following:    WBC 12.6 (*)    RBC 2.83 (*)    Hemoglobin 8.9 (*)    HCT  25.8 (*)    RDW 15.9 (*)    Platelets 61 (*)    All other components within normal limits  BASIC METABOLIC  PANEL - Abnormal; Notable for the following:    Sodium 129 (*)    Potassium 5.3 (*)    CO2 16 (*)    Glucose, Bld 255 (*)    BUN 108 (*)    Creatinine, Ser 5.23 (*)    Calcium 8.0 (*)    GFR calc non Af Amer 11 (*)    GFR calc Af Amer 13 (*)    All other components within normal limits  URINALYSIS, ROUTINE W REFLEX MICROSCOPIC - Abnormal; Notable for the following:    APPearance CLOUDY (*)    Hgb urine dipstick SMALL (*)    Bilirubin Urine SMALL (*)    Protein, ur 30 (*)    Leukocytes, UA MODERATE (*)    All other components within normal limits  URINE MICROSCOPIC-ADD ON - Abnormal; Notable for the following:    Bacteria, UA FEW (*)    Casts HYALINE CASTS (*)    All other components within normal limits  CBG MONITORING, ED - Abnormal; Notable for the following:    Glucose-Capillary 238 (*)    All other components within normal limits  URINE CULTURE  CULTURE, BLOOD (ROUTINE X 2)  CULTURE, BLOOD (ROUTINE X 2)  I-STAT CG4 LACTIC ACID, ED    Imaging Review Dg Chest Port 1 View  08/02/2014   CLINICAL DATA:  Fatigue and weakness for 3 days now - having mid left side rib pain (son squeezed him around ribs trying to help him off toilet) - diabetic, usually on htn meds but BP has been low for a couple days - had kidney transplant in January but rejection is feared .  EXAM: PORTABLE CHEST - 1 VIEW  COMPARISON:  07/09/2013  FINDINGS: Lungs are adequately inflated and otherwise clear. Cardiomediastinal silhouette is normal. There is mild calcified plaque over the aortic arch. Remaining bony structures are within normal. No definite rib fracture.  IMPRESSION: No active disease.   Electronically Signed   By: Marin Olp M.D.   On: 08/02/2014 13:54   Ct Renal Stone Study  08/02/2014   CLINICAL DATA:  Right flank pain.  Renal transplant January 2016  EXAM: CT ABDOMEN AND PELVIS WITHOUT CONTRAST  TECHNIQUE: Multidetector CT imaging of the abdomen and pelvis was performed following the standard  protocol without IV contrast.  COMPARISON:  CT 11/26/2011  FINDINGS: Lung bases are clear. Coronary calcification. Mild pericardial thickening anteriorly.  Pneumobilia. This was present on the prior CT and is related to prior cholecystectomy. No liver lesion identified. The spleen is normal in size. No evidence of pancreatitis or mass lesion.  Negative kidneys are atrophic and contain multiple calcifications. No renal mass or obstruction.  Transplant kidney right lower quadrant. There is subcapsular high-density material with associated calcification most compatible with chronic subcapsular hematoma. This was not present on the prior study as the patient had not had renal transplant at that time. The transplant ureter is dilated however there is no definite hydronephrosis in the kidney. Correlation with prior studies may be helpful. Ultrasound may also be helpful.  The urinary bladder is irregular markedly and thickened. This could be due to infection or tumor or possibly recent surgery. Correlate with urinalysis. Prostate is significantly enlarged.  Extensive vascular calcification.  Negative for aortic aneurysm.  Mildly dilated proximal small bowel with air-fluid levels suggestive of partial obstruction.  Fluid-filled dilated distal small bowel. Colon is decompressed.  Compression fractures L3 and L4 were not present in 2013. These are most likely chronic given the amount of sclerosis present at the fracture site.  IMPRESSION: Right lower quadrant renal transplant noted. The transplant ureter is dilated however no definite hydronephrosis is seen within the kidney. Cannot exclude some degree of obstruction. There is a subacute or chronic subcapsular hematoma around the transplant kidney.  Urinary bladder is markedly thickened and irregular which may be due to infection or tumor.  Small bowel dilatation compatible with partial small bowel obstruction.  Compression fractures L3 and L4 most likely chronic.    Electronically Signed   By: Franchot Gallo M.D.   On: 08/02/2014 15:25     EKG Interpretation   Date/Time:  Tuesday August 02 2014 11:53:16 EDT Ventricular Rate:  99 PR Interval:  138 QRS Duration: 70 QT Interval:  332 QTC Calculation: 426 R Axis:   30 Text Interpretation:  Normal sinus rhythm Septal infarct , age  undetermined Abnormal ECG Confirmed by Hazle Coca [phone]) on 08/02/2014  12:42:38 PM      MDM   Final diagnoses:  Acute renal transplant rejection  Thrombocytopenia  Generalized weakness   Pt is a 59yo male 2 months s/p kidney transplant at [facility].  Advised to go to closest ED by Kidney Coordinator, [name].  Pt presenting to ED with worsening generalized weakness.    Discussed pt with Dr. Ralene Bathe who also examined pt.  Labs: concerning for acute rejection of recent kidney transplant.   1:26 PM Called [name], Kidney Coordinator at [facility].  Left a message, will call back. 1:47 PM consulted with [name] who advised she will consult with Dr. Altamease Oiler, pt will likely need transfer to [facility].  1:56 PM consulted with Dr. Altamease Oiler, agreed to accept pt at [facility], pt will be a ED to ED transfer.  Recommended pt get a non-contrast CT as well as urine and blood cultures sent.  Pt transferred to [facility] in stable condition.  EMTALA forms filled out by Dr. Ralene Bathe.    Noland Fordyce, PA-C 08/02/14 1653  Quintella Reichert, MD 08/02/14 [phone]

## 2014-08-02 NOTE — ED Notes (Signed)
CBG 238; Triage RN informed.

## 2014-08-05 LAB — CULTURE, BLOOD (ROUTINE X 2)

## 2014-08-05 LAB — URINE CULTURE: Colony Count: >=100000

## 2014-08-08 ENCOUNTER — Telehealth (HOSPITAL_COMMUNITY): Payer: Self-pay

## 2014-08-08 NOTE — ED Notes (Signed)
Urine culture faxed to nursing station at [place]

## 2014-08-10 ENCOUNTER — Ambulatory Visit: Payer: Managed Care, Other (non HMO) | Admitting: Physical Therapy

## 2014-08-15 ENCOUNTER — Ambulatory Visit: Payer: Managed Care, Other (non HMO) | Admitting: Physical Therapy

## 2014-08-18 ENCOUNTER — Ambulatory Visit: Payer: Managed Care, Other (non HMO) | Admitting: Physical Therapy

## 2014-08-22 ENCOUNTER — Encounter: Payer: Managed Care, Other (non HMO) | Admitting: Physical Therapy

## 2014-08-23 ENCOUNTER — Ambulatory Visit: Payer: Managed Care, Other (non HMO) | Attending: Family Medicine | Admitting: Physical Therapy

## 2014-08-23 ENCOUNTER — Encounter: Payer: Self-pay | Admitting: Physical Therapy

## 2014-08-23 DIAGNOSIS — Z7409 Other reduced mobility: Secondary | ICD-10-CM

## 2014-08-23 DIAGNOSIS — I120 Hypertensive chronic kidney disease with stage 5 chronic kidney disease or end stage renal disease: Secondary | ICD-10-CM | POA: Insufficient documentation

## 2014-08-23 DIAGNOSIS — I4891 Unspecified atrial fibrillation: Secondary | ICD-10-CM | POA: Insufficient documentation

## 2014-08-23 DIAGNOSIS — N186 End stage renal disease: Secondary | ICD-10-CM | POA: Insufficient documentation

## 2014-08-23 DIAGNOSIS — R269 Unspecified abnormalities of gait and mobility: Secondary | ICD-10-CM | POA: Diagnosis not present

## 2014-08-23 DIAGNOSIS — R5381 Other malaise: Secondary | ICD-10-CM | POA: Insufficient documentation

## 2014-08-23 DIAGNOSIS — E119 Type 2 diabetes mellitus without complications: Secondary | ICD-10-CM | POA: Diagnosis not present

## 2014-08-23 DIAGNOSIS — Z89511 Acquired absence of right leg below knee: Secondary | ICD-10-CM | POA: Diagnosis not present

## 2014-08-23 DIAGNOSIS — R2689 Other abnormalities of gait and mobility: Secondary | ICD-10-CM

## 2014-08-23 DIAGNOSIS — Z89512 Acquired absence of left leg below knee: Secondary | ICD-10-CM | POA: Diagnosis not present

## 2014-08-23 DIAGNOSIS — R531 Weakness: Secondary | ICD-10-CM

## 2014-08-24 NOTE — Therapy (Signed)
[facility] [address] [place], [place], [address] Phone: [phone]   Fax:  [phone]  Physical Therapy Treatment  Patient Details  Name: Paul [name] MRN: [id number] Date of Birth: 01/16/1956 Referring Provider:  [name], MD  Encounter Date: 08/23/2014      PT End of Session - 08/24/14 0800    Visit Number 3   Number of Visits 17   Date for PT Re-Evaluation 09/23/14   PT Start Time 0935   PT Stop Time 1015   PT Time Calculation (min) 40 min   Equipment Utilized During Treatment Gait belt   Activity Tolerance Patient tolerated treatment well   Behavior During Therapy [facility] for tasks assessed/performed      Past Medical History  Diagnosis Date  . Open wound of both legs with complication     Pt has had progressive wounds of both LE's including gangrene of the toes and patchy necrosis of the calves.  He has been treated at [facility] by Dr Jerline Pain with hyperbaric O2 5d per week.  He is getting Na Thiosulfate with HD for suspected calciphylaxis. Pt says doctor's aren't sure if these ulcers were diabetic ulcers or calciphylaxis.  He underwent bilat BKA on 07/30/13.  He says that the woun  . ESRD on hemodialysis     Pt has ESRD due to DM.  He had a L upper arm AVF prior to starting HD which was ligated due to L arm steal syndrome.  He started HD in Jan 2014 and did home HD.  The family couldn't cannulate the R arm AVF successfully so by mid 2014 they decided to switch to PD which was done late summer 2014.  In Jan 2015 he was admitted with FTT felt to be due to underdialysis on PD and PD was abandoned and he   . Anemia     esrd  . Hypertension     off of meds due to orthostatic hypotension  . Closed left arm fracture 1967  . Heart murmur   . Corneal abrasion, left   . GERD (gastroesophageal reflux disease)   . History of blood transfusion   . Glomerulosclerosis, diabetic   . Depression   . A-fib   .  Calciphylaxis 05/2013    lower ext  . Diabetes mellitus     Type 1 iddm x 11 yrs  . Diabetic neuropathy   . Cancer     hx duodenal adenoCa 2002, resected    Past Surgical History  Procedure Laterality Date  . Whipple procedure  2002    duodenal ca, Dr. Harlow Asa  . Bone marrow biopsy  2012  . Renal biopsy, percutaneous  2012  . Tonsillectomy    . Portacath placement  2002  . Biliary diversion, external  2002  . Other surgical history      Cyst removed from back  . Av fistula placement  06/07/2011    Procedure: ARTERIOVENOUS (AV) FISTULA CREATION;  Surgeon: Angelia Mould, MD;  Location: [facility];  Service: Vascular;  Laterality: Left;  . Ligation of arteriovenous  fistula  05/22/2012    Procedure: LIGATION OF ARTERIOVENOUS  FISTULA;  Surgeon: Mal Misty, MD;  Location: [facility];  Service: Vascular;  Laterality: Left;  Left brachio-cephalic fistula  . Insertion of dialysis catheter  05/22/2012    Procedure: INSERTION OF DIALYSIS CATHETER;  Surgeon: Mal Misty, MD;  Location: [facility];  Service: Vascular;  Laterality: N/A;  right internal  jugular vein  . Av fistula placement  06/18/2012    Procedure: ARTERIOVENOUS (AV) FISTULA CREATION;  Surgeon: Mal Misty, MD;  Location: [facility];  Service: Vascular;  Laterality: Right;  [place]  . Colonoscopy      Hx: of  . Capd insertion N/A 01/14/2013    Procedure: LAPAROSCOPIC INSERTION CONTINUOUS AMBULATORY PERITONEAL DIALYSIS  (CAPD) CATHETER;  Surgeon: Adin Hector, MD;  Location: [facility];  Service: General;  Laterality: N/A;  . Vascular surgery    . Removal of a dialysis catheter    . Capd removal N/A 07/09/2013    Procedure: CONTINUOUS AMBULATORY PERITONEAL DIALYSIS  (CAPD) CATHETER REMOVAL;  Surgeon: Adin Hector, MD;  Location: [facility];  Service: General;  Laterality: N/A;  . Amputation Bilateral 07/30/2013    Procedure: AMPUTATION BELOW KNEE;  Surgeon: Newt Minion, MD;  Location: [place];  Service: Orthopedics;  Laterality: Bilateral;   Bilateral Below Knee Amputations  . Amputation Bilateral 09/03/2013    Procedure: AMPUTATION BELOW KNEE;  Surgeon: Newt Minion, MD;  Location: [place];  Service: Orthopedics;  Laterality: Bilateral;  Bilateral Below Knee Amputation Revision  . Lower extremity angiogram N/A 06/14/2013    Procedure: LOWER EXTREMITY ANGIOGRAM;  Surgeon: Angelia Mould, MD;  Location: [facility] CATH LAB;  Service: Cardiovascular;  Laterality: N/A;  . Nephrectomy transplanted organ      There were no vitals filed for this visit.  Visit Diagnosis:  Abnormality of gait  S/P bilateral BKA (below knee amputation)  Weakness generalized  Impaired functional mobility and activity tolerance  Balance problems      Subjective Assessment - 08/23/14 0948    Subjective Got UTI that went into blood system. Hospitalized ~ 2 weeks for antibiodics. Discharged 08/15/14 with authorization to resume PT. In hospital wore prostheses limited and since delivery wearing when out of bed but spending <50% of awake hours in bed now.   Currently in Pain? No/denies                       [facility] Adult PT Treatment/Exercise - 08/24/14 0001    Transfers   Sit to Stand 5: Supervision;With upper extremity assist;With armrests;From chair/3-in-1   Stand to Sit 5: Supervision;With upper extremity assist;With armrests;To chair/3-in-1   Ambulation/Gait   Ambulation/Gait Yes   Ambulation/Gait Assistance 5: Supervision;4: Min assist   Ambulation Distance (Feet) 200 Feet  3x   Assistive device Prostheses;Lofstrands   Gait Pattern Step-through pattern;Decreased step length - right;Decreased step length - left;Decreased stride length;Right hip hike;Left hip hike;Right flexed knee in stance;Left flexed knee in stance;Lateral hip instability;Wide base of support   Ramp 4: Min assist  prostheses & forearm crutches   Curb 4: Min assist  prostheses & forearm crutches   Prosthetics   Current prosthetic wear tolerance (days/week)  PT  instructed to return to 7 days /wk   Current prosthetic wear tolerance (#hours/day)  reports wear when out of bed but only out of bed ~4-8 hours/day  did not wear prostheses in hospital for ~2 wks   Residual limb condition  intact but no ply socks and too deep in socket with 13# wt loss                PT Education - 08/23/14 0930    Education provided Yes   Education Details wear schedule and potential to function, increasing activity level, returning to [facility] in next couple of weeks with initially only doing 1 thing  like light cardio, a few wt machines and build up to more activities    Person(s) Educated Patient;Spouse   Methods Explanation   Comprehension Verbalized understanding          PT Short Term Goals - 07/28/14 1645    PT SHORT TERM GOAL #1   Title Pt will be able to: Ambulate 500 ft. with forearm crutches and prostheses with supervision. (Target Date: 08/26/2014)   Time 1   Period Months   Status New   PT SHORT TERM GOAL #2   Title Patient will be able to ambulate 39' around furniture with 1 crutch & prostheses with supervision. (Target Date: 08/26/2014)   Time 1   Period Months   Status New   PT SHORT TERM GOAL #3   Title Pt will be at to:  Perform standing activities for >15 minutes with prostheses with minimal c/o fatigue. (Target Date: 08/26/2014)   Time 1   Period Months   Status New   PT SHORT TERM GOAL #4   Title Pt will be able to reach 10" & within 5" of floor with support of 1 crutch with supervision. (Target Date: 08/26/2014)   Time 1   Period Months   Status New   PT SHORT TERM GOAL #5   Title Pt will be able to: negotiate ramp & curb with forearm crutches & prostheses with supervision. (Target Date: 08/26/2014)   Time 1   Period Months   Status New           PT Long Term Goals - 07/27/14 1630    PT LONG TERM GOAL #1   Title Pt will be able to: Ambulate 881f with LRAD & prostheses modified independent. (Target Date: 10/14/2014)   Time 12    Period Weeks   Status New   PT LONG TERM GOAL #2   Title Pt will be able to: ambulate 200' on uneven (grass) surfaces with LRAD & prostheses modified independent.  (Target Date: 10/14/2014)   Time 12   Period Weeks   Status New   PT LONG TERM GOAL #3   Title Pt will be able to: negotiate ramp, curb, stairs with LRAD & prostheses modified independent.  (Target Date: 10/14/2014)   Time 12   Period Weeks   Status New   PT LONG TERM GOAL #4   Title Pt will be able to: perform standing activities for > 20 minutes & prostheses with no pain or discomfort.  (Target Date: 10/14/2014)   Time 12   Period Weeks   Status New   PT LONG TERM GOAL #5   Title Berg Balance >/= 45/56 (Target Date: 10/14/2014)   Time 12   Period Weeks   Status New   PT LONG TERM GOAL #6   Title Patient will ambulate in home simulated (100' and single step) with 1 crutch or less restrictive device modified independent. (Target Date: 10/14/2014)   Time 12   Period Weeks   Status New               Plan - 08/23/14 0930    Clinical Impression Statement Patient is deconditioned even more than at evaluation from recent illness / hospitalization. He tolerated session but required more frequent rest periods that we utilized with pt education.   Pt will benefit from skilled therapeutic intervention in order to improve on the following deficits Abnormal gait;Decreased activity tolerance;Decreased balance;Decreased endurance;Decreased mobility;Decreased strength   Rehab Potential Good   PT Frequency 2x /  week   PT Duration 12 weeks   PT Treatment/Interventions ADLs/Self Care Home Management;Gait training;Stair training;Functional mobility training;Therapeutic activities;Therapeutic exercise;Balance training;Neuromuscular re-education;Patient/family education   PT Next Visit Plan HEP to increase activity level & balance   Consulted and Agree with Plan of Care Patient;Family member/caregiver   Family Member Consulted wife         Problem List Patient Active Problem List   Diagnosis Date Noted  . Abnormal cardiovascular stress test 12/29/2013  . Awaiting organ transplant 12/09/2013  . Leg ulcer 11/17/2013  . Chronic kidney disease (CKD), stage V 11/17/2013  . Diabetes 11/17/2013  . Cancer of duodenum 11/17/2013  . Dyslipidemia 11/17/2013  . Chronic kidney disease requiring chronic dialysis 11/17/2013  . Hypercholesteremia 11/17/2013  . H/O malignant neoplasm 11/17/2013  . H/O: HTN (hypertension) 11/17/2013  . BP (high blood pressure) 11/17/2013  . Hypotension 11/17/2013  . Angiopathy, peripheral 11/17/2013  . AF (paroxysmal atrial fibrillation) 11/17/2013  . BKA stump complication 33/29/5188  . S/P bilateral BKA (below knee amputation) 08/03/2013  . S/P BKA (below knee amputation) bilateral 07/30/2013  . Open wound of both legs with complication 41/66/0630  . Acute blood loss anemia 07/12/2013  . Syncope 07/09/2013  . Intolerance to CAPD peritoneal dialysis s/p CAPD cath removal 07/09/2013 07/05/2013  . Critical lower limb ischemia 06/30/2013  . Extremity pain 06/30/2013  . Atherosclerosis of native arteries of the extremities with ulceration(440.23) 06/17/2013  . Gait disorder 05/31/2013  . Weakness 05/24/2013  . Depression 04/03/2013  . GERD (gastroesophageal reflux disease) 04/03/2013  . Atrial fibrillation 04/02/2013  . DM (diabetes mellitus) 12/28/2012  . Other complications due to renal dialysis device, implant, and graft 05/19/2012  . Diabetes mellitus, type 2 11/13/2011  . Anemia 07/02/2011  . ESRD on hemodialysis 05/29/2011    Jamey Reas PT, DPT 08/24/2014, 8:18 AM  [facility] [address] [place], [place], [address] Phone: [phone]   Fax:  [phone]

## 2014-08-25 ENCOUNTER — Ambulatory Visit: Payer: Managed Care, Other (non HMO) | Admitting: Physical Therapy

## 2014-08-31 ENCOUNTER — Ambulatory Visit: Payer: Managed Care, Other (non HMO) | Admitting: Physical Therapy

## 2014-08-31 ENCOUNTER — Encounter: Payer: Self-pay | Admitting: Physical Therapy

## 2014-08-31 DIAGNOSIS — Z89511 Acquired absence of right leg below knee: Secondary | ICD-10-CM

## 2014-08-31 DIAGNOSIS — Z89512 Acquired absence of left leg below knee: Secondary | ICD-10-CM

## 2014-08-31 DIAGNOSIS — R269 Unspecified abnormalities of gait and mobility: Secondary | ICD-10-CM

## 2014-08-31 DIAGNOSIS — R531 Weakness: Secondary | ICD-10-CM

## 2014-08-31 DIAGNOSIS — Z7409 Other reduced mobility: Secondary | ICD-10-CM

## 2014-08-31 DIAGNOSIS — R2689 Other abnormalities of gait and mobility: Secondary | ICD-10-CM

## 2014-09-01 NOTE — Therapy (Signed)
[facility] [address] [place], [place], [address] Phone: [phone]   Fax:  [phone]  Physical Therapy Treatment  Patient Details  Name: Paul [name] MRN: [id number] Date of Birth: [birth year] Referring Provider:  [name], MD  Encounter Date: 08/31/2014      PT End of Session - 08/31/14 1015    Visit Number 4   Number of Visits 17   Date for PT Re-Evaluation 09/23/14   PT Start Time 2671   PT Stop Time 1100   PT Time Calculation (min) 45 min   Equipment Utilized During Treatment Gait belt   Activity Tolerance Patient tolerated treatment well   Behavior During Therapy [facility] for tasks assessed/performed      Past Medical History  Diagnosis Date  . Open wound of both legs with complication     Pt has had progressive wounds of both LE's including gangrene of the toes and patchy necrosis of the calves.  He has been treated at [facility] by Dr Jerline Pain with hyperbaric O2 5d per week.  He is getting Na Thiosulfate with HD for suspected calciphylaxis. Pt says doctor's aren't sure if these ulcers were diabetic ulcers or calciphylaxis.  He underwent bilat BKA on 07/30/13.  He says that the woun  . ESRD on hemodialysis     Pt has ESRD due to DM.  He had a L upper arm AVF prior to starting HD which was ligated due to L arm steal syndrome.  He started HD in Jan 2014 and did home HD.  The family couldn't cannulate the R arm AVF successfully so by mid 2014 they decided to switch to PD which was done late summer 2014.  In Jan 2015 he was admitted with FTT felt to be due to underdialysis on PD and PD was abandoned and he   . Anemia     esrd  . Hypertension     off of meds due to orthostatic hypotension  . Closed left arm fracture 1967  . Heart murmur   . Corneal abrasion, left   . GERD (gastroesophageal reflux disease)   . History of blood transfusion   . Glomerulosclerosis, diabetic   . Depression   . A-fib   .  Calciphylaxis 05/2013    lower ext  . Diabetes mellitus     Type 1 iddm x 11 yrs  . Diabetic neuropathy   . Cancer     hx duodenal adenoCa 2002, resected    Past Surgical History  Procedure Laterality Date  . Whipple procedure  2002    duodenal ca, Dr. Harlow Asa  . Bone marrow biopsy  2012  . Renal biopsy, percutaneous  2012  . Tonsillectomy    . Portacath placement  2002  . Biliary diversion, external  2002  . Other surgical history      Cyst removed from back  . Av fistula placement  06/07/2011    Procedure: ARTERIOVENOUS (AV) FISTULA CREATION;  Surgeon: Angelia Mould, MD;  Location: [facility];  Service: Vascular;  Laterality: Left;  . Ligation of arteriovenous  fistula  05/22/2012    Procedure: LIGATION OF ARTERIOVENOUS  FISTULA;  Surgeon: Mal Misty, MD;  Location: [facility];  Service: Vascular;  Laterality: Left;  Left brachio-cephalic fistula  . Insertion of dialysis catheter  05/22/2012    Procedure: INSERTION OF DIALYSIS CATHETER;  Surgeon: Mal Misty, MD;  Location: [facility];  Service: Vascular;  Laterality: N/A;  right internal  jugular vein  . Av fistula placement  06/18/2012    Procedure: ARTERIOVENOUS (AV) FISTULA CREATION;  Surgeon: Mal Misty, MD;  Location: [facility];  Service: Vascular;  Laterality: Right;  [place]  . Colonoscopy      Hx: of  . Capd insertion N/A 01/14/2013    Procedure: LAPAROSCOPIC INSERTION CONTINUOUS AMBULATORY PERITONEAL DIALYSIS  (CAPD) CATHETER;  Surgeon: Adin Hector, MD;  Location: [facility];  Service: General;  Laterality: N/A;  . Vascular surgery    . Removal of a dialysis catheter    . Capd removal N/A 07/09/2013    Procedure: CONTINUOUS AMBULATORY PERITONEAL DIALYSIS  (CAPD) CATHETER REMOVAL;  Surgeon: Adin Hector, MD;  Location: [facility];  Service: General;  Laterality: N/A;  . Amputation Bilateral 07/30/2013    Procedure: AMPUTATION BELOW KNEE;  Surgeon: Newt Minion, MD;  Location: [facility] [place];  Service: Orthopedics;  Laterality: Bilateral;   Bilateral Below Knee Amputations  . Amputation Bilateral 09/03/2013    Procedure: AMPUTATION BELOW KNEE;  Surgeon: Newt Minion, MD;  Location: [facility];  Service: Orthopedics;  Laterality: Bilateral;  Bilateral Below Knee Amputation Revision  . Lower extremity angiogram N/A 06/14/2013    Procedure: LOWER EXTREMITY ANGIOGRAM;  Surgeon: Angelia Mould, MD;  Location: [facility] CATH LAB;  Service: Cardiovascular;  Laterality: N/A;  . Nephrectomy transplanted organ      There were no vitals filed for this visit.  Visit Diagnosis:  Abnormality of gait  S/P bilateral BKA (below knee amputation)  Weakness generalized  Impaired functional mobility and activity tolerance  Balance problems      Subjective Assessment - 08/31/14 1014    Subjective he is out of bed ~60-70% of his normal time and wearing prostheses when out of bed without issues. Denies falls.   Currently in Pain? No/denies                       [facility] Adult PT Treatment/Exercise - 08/31/14 1015    Transfers   Sit to Stand 5: Supervision;With upper extremity assist;With armrests;From chair/3-in-1   Stand to Sit 5: Supervision;With upper extremity assist;With armrests;To chair/3-in-1   Ambulation/Gait   Ambulation/Gait Yes   Ambulation/Gait Assistance 5: Supervision;4: Min assist   Ambulation Distance (Feet) 200 Feet  200' x 2 with crutches, 50' x 4 with 1 crutch   Assistive device Prostheses;Lofstrands   Gait Pattern Step-through pattern;Decreased step length - right;Decreased step length - left;Decreased stride length;Right hip hike;Left hip hike;Right flexed knee in stance;Left flexed knee in stance;Lateral hip instability;Wide base of support   Ambulation Surface Indoor;Level   Ramp 4: Min assist  prostheses & forearm crutches   Curb 4: Min assist  prostheses & forearm crutches   Dynamic Standing Balance   Dynamic Standing - Balance Support No upper extremity supported  sitting on 24" stool for modified  stand & in parallel bars   Dynamic Standing - Level of Assistance 5: Stand by assistance   Dynamic Standing - Balance Activities Lateral lean/weight shifting;Forward lean/weight shifting;Reaching for objects  trunk rotation, side bends, blue theraband UE    Dynamic Standing - Comments blue theraband single UE & BUE: row, forward reach & upward reach 10 reps each   Knee/Hip Exercises: Machines for Strengthening   Cybex Leg Press BLE 75# 15 reps, RLE & LLE 40# 15 reps each   Knee/Hip Exercises: Standing   Lateral Step Up 5 reps;Both;Hand Hold: 2;Step Height: 4"  in parallel bars with minimizing  UE use alt. lead limb   Forward Step Up Right;Left;Both;5 reps;Hand Hold: 2;Step Height: 4"  in parallel bars with minimizing UE use alt. lead limb   Step Down Right;Left;Both;5 reps;Hand Hold: 2;Step Height: 4"  in parallel bars with minimizing UE use alt. lead limb   Prosthetics   Current prosthetic wear tolerance (days/week)  PT instructed to return to 7 days /wk   Current prosthetic wear tolerance (#hours/day)  reports wear when out of bed but only out of bed ~4-8 hours/day  did not wear prostheses in hospital for ~2 wks   Residual limb condition  intact but no ply socks and too deep in socket with 13# wt loss                PT Education - 08/31/14 1015    Education provided Yes   Education Details modified standing balance sitting on 24" bar stool between 2 chairs   Person(s) Educated Patient;Spouse   Methods Explanation;Demonstration   Comprehension Verbalized understanding;Returned demonstration;Verbal cues required;Need further instruction          PT Short Term Goals - 07/28/14 1645    PT SHORT TERM GOAL #1   Title Pt will be able to: Ambulate 500 ft. with forearm crutches and prostheses with supervision. (Target Date: 08/26/2014)   Time 1   Period Months   Status New   PT SHORT TERM GOAL #2   Title Patient will be able to ambulate 95' around furniture with 1 crutch &  prostheses with supervision. (Target Date: 08/26/2014)   Time 1   Period Months   Status New   PT SHORT TERM GOAL #3   Title Pt will be at to:  Perform standing activities for >15 minutes with prostheses with minimal c/o fatigue. (Target Date: 08/26/2014)   Time 1   Period Months   Status New   PT SHORT TERM GOAL #4   Title Pt will be able to reach 10" & within 5" of floor with support of 1 crutch with supervision. (Target Date: 08/26/2014)   Time 1   Period Months   Status New   PT SHORT TERM GOAL #5   Title Pt will be able to: negotiate ramp & curb with forearm crutches & prostheses with supervision. (Target Date: 08/26/2014)   Time 1   Period Months   Status New           PT Long Term Goals - 07/27/14 1630    PT LONG TERM GOAL #1   Title Pt will be able to: Ambulate 853f with LRAD & prostheses modified independent. (Target Date: 10/14/2014)   Time 12   Period Weeks   Status New   PT LONG TERM GOAL #2   Title Pt will be able to: ambulate 200' on uneven (grass) surfaces with LRAD & prostheses modified independent.  (Target Date: 10/14/2014)   Time 12   Period Weeks   Status New   PT LONG TERM GOAL #3   Title Pt will be able to: negotiate ramp, curb, stairs with LRAD & prostheses modified independent.  (Target Date: 10/14/2014)   Time 12   Period Weeks   Status New   PT LONG TERM GOAL #4   Title Pt will be able to: perform standing activities for > 20 minutes & prostheses with no pain or discomfort.  (Target Date: 10/14/2014)   Time 12   Period Weeks   Status New   PT LONG TERM GOAL #5   Title  Berg Balance >/= 45/56 (Target Date: 10/14/2014)   Time 12   Period Weeks   Status New   PT LONG TERM GOAL #6   Title Patient will ambulate in home simulated (100' and single step) with 1 crutch or less restrictive device modified independent. (Target Date: 10/14/2014)   Time 12   Period Weeks   Status New               Plan - 08/31/14 1015    Clinical Impression Statement  Patient is still fatigued with recent infection and still on antibiodics. He appears to understand modified standing with recommendation to break up activities with 10 min/hour exercise for now.    Pt will benefit from skilled therapeutic intervention in order to improve on the following deficits Abnormal gait;Decreased activity tolerance;Decreased balance;Decreased endurance;Decreased mobility;Decreased strength   Rehab Potential Good   PT Frequency 2x / week   PT Duration 12 weeks   PT Treatment/Interventions ADLs/Self Care Home Management;Gait training;Stair training;Functional mobility training;Therapeutic activities;Therapeutic exercise;Balance training;Neuromuscular re-education;Patient/family education   PT Next Visit Plan HEP to increase activity level & balance   Consulted and Agree with Plan of Care Patient;Family member/caregiver   Family Member Consulted wife        Problem List Patient Active Problem List   Diagnosis Date Noted  . Abnormal cardiovascular stress test 12/29/2013  . Awaiting organ transplant 12/09/2013  . Leg ulcer 11/17/2013  . Chronic kidney disease (CKD), stage V 11/17/2013  . Diabetes 11/17/2013  . Cancer of duodenum 11/17/2013  . Dyslipidemia 11/17/2013  . Chronic kidney disease requiring chronic dialysis 11/17/2013  . Hypercholesteremia 11/17/2013  . H/O malignant neoplasm 11/17/2013  . H/O: HTN (hypertension) 11/17/2013  . BP (high blood pressure) 11/17/2013  . Hypotension 11/17/2013  . Angiopathy, peripheral 11/17/2013  . AF (paroxysmal atrial fibrillation) 11/17/2013  . BKA stump complication 95/63/8756  . S/P bilateral BKA (below knee amputation) 08/03/2013  . S/P BKA (below knee amputation) bilateral 07/30/2013  . Open wound of both legs with complication 43/32/9518  . Acute blood loss anemia 07/12/2013  . Syncope 07/09/2013  . Intolerance to CAPD peritoneal dialysis s/p CAPD cath removal 07/09/2013 07/05/2013  . Critical lower limb ischemia  06/30/2013  . Extremity pain 06/30/2013  . Atherosclerosis of native arteries of the extremities with ulceration(440.23) 06/17/2013  . Gait disorder 05/31/2013  . Weakness 05/24/2013  . Depression 04/03/2013  . GERD (gastroesophageal reflux disease) 04/03/2013  . Atrial fibrillation 04/02/2013  . DM (diabetes mellitus) 12/28/2012  . Other complications due to renal dialysis device, implant, and graft 05/19/2012  . Diabetes mellitus, type 2 11/13/2011  . Anemia 07/02/2011  . ESRD on hemodialysis 05/29/2011    Jamey Reas PT, DPT 09/01/2014, 5:25 PM  [facility] [address] [place], [place], [address] Phone: [phone]   Fax:  [phone]

## 2014-09-02 ENCOUNTER — Encounter: Payer: Self-pay | Admitting: Physical Therapy

## 2014-09-02 ENCOUNTER — Ambulatory Visit: Payer: Managed Care, Other (non HMO) | Admitting: Physical Therapy

## 2014-09-02 DIAGNOSIS — R531 Weakness: Secondary | ICD-10-CM

## 2014-09-02 DIAGNOSIS — Z7409 Other reduced mobility: Secondary | ICD-10-CM

## 2014-09-02 DIAGNOSIS — R269 Unspecified abnormalities of gait and mobility: Secondary | ICD-10-CM

## 2014-09-02 DIAGNOSIS — R2689 Other abnormalities of gait and mobility: Secondary | ICD-10-CM

## 2014-09-02 NOTE — Patient Instructions (Signed)
Walking Program:  Begin walking for exercise for 10 minutes, 1-2  times/day, 3-5 days/week.   Progress your walking program by adding 1-3 minutes to your routine each week, as tolerated. Be sure to wear good walking shoes, walk in a safe environment and only progress to your tolerance.      "I love a Parade" Lift   With upper extremity support, march forwards and backwards. 3 laps each direction per session. Perform 1 session per day, 2-3 days per week.  http://gt2.exer.us/344   Copyright  VHI. All rights reserved.  Side-Stepping   With upper extremity support, Walk to left side with eyes open. Take even steps, leading with same foot. Make sure each foot lifts off the floor. Repeat in opposite direction. Repeat 3 laps in each direction per session. Do _1___ sessions per day, 2-3 days per week.   Copyright  VHI. All rights reserved.  Backward   With upper extremity support. Walk backwards with eyes open. Take even steps, making sure each foot lifts off floor. Repeat 3 laps per session. Do __1__ sessions per day, 2-3 days per week.  Copyright  VHI. All rights reserved.    Feet Heel-Toe "Tandem"   Using forearm crutches, walk a straight line forward while bringing one foot directly in front of the other. Repeat by walking backwards with one foot directly behind the other foot. Repeat 3 laps per direction per session. Do __1__ sessions per day, 2-3 days per week.  Copyright  VHI. All rights reserved.

## 2014-09-04 NOTE — Therapy (Signed)
[facility] [address] [place], [place], [address] Phone: [phone]   Fax:  [phone]  Physical Therapy Treatment  Patient Details  Name: Paul [name] MRN: [id number] Date of Birth: 1955-11-07 Referring Provider:  [name], MD  Encounter Date: 09/02/2014     09/02/14 1321  PT Visits / Re-Eval  Visit Number 5  Number of Visits 17  Date for PT Re-Evaluation 09/23/14  PT Time Calculation  PT Start Time 1318  PT Stop Time 1402  PT Time Calculation (min) 44 min  PT - End of Session  Equipment Utilized During Treatment Gait belt  Activity Tolerance Patient tolerated treatment well  Behavior During Therapy [facility] for tasks assessed/performed    Past Medical History  Diagnosis Date  . Open wound of both legs with complication     Pt has had progressive wounds of both LE's including gangrene of the toes and patchy necrosis of the calves.  He has been treated at [facility] by Dr Jerline Pain with hyperbaric O2 5d per week.  He is getting Na Thiosulfate with HD for suspected calciphylaxis. Pt says doctor's aren't sure if these ulcers were diabetic ulcers or calciphylaxis.  He underwent bilat BKA on 07/30/13.  He says that the woun  . ESRD on hemodialysis     Pt has ESRD due to DM.  He had a L upper arm AVF prior to starting HD which was ligated due to L arm steal syndrome.  He started HD in Jan 2014 and did home HD.  The family couldn't cannulate the R arm AVF successfully so by mid 2014 they decided to switch to PD which was done late summer 2014.  In Jan 2015 he was admitted with FTT felt to be due to underdialysis on PD and PD was abandoned and he   . Anemia     esrd  . Hypertension     off of meds due to orthostatic hypotension  . Closed left arm fracture 1967  . Heart murmur   . Corneal abrasion, left   . GERD (gastroesophageal reflux disease)   . History of blood transfusion   . Glomerulosclerosis, diabetic   .  Depression   . A-fib   . Calciphylaxis 05/2013    lower ext  . Diabetes mellitus     Type 1 iddm x 11 yrs  . Diabetic neuropathy   . Cancer     hx duodenal adenoCa 2002, resected    Past Surgical History  Procedure Laterality Date  . Whipple procedure  2002    duodenal ca, Dr. Harlow Asa  . Bone marrow biopsy  2012  . Renal biopsy, percutaneous  2012  . Tonsillectomy    . Portacath placement  2002  . Biliary diversion, external  2002  . Other surgical history      Cyst removed from back  . Av fistula placement  06/07/2011    Procedure: ARTERIOVENOUS (AV) FISTULA CREATION;  Surgeon: Angelia Mould, MD;  Location: [place];  Service: Vascular;  Laterality: Left;  . Ligation of arteriovenous  fistula  05/22/2012    Procedure: LIGATION OF ARTERIOVENOUS  FISTULA;  Surgeon: Mal Misty, MD;  Location: [place];  Service: Vascular;  Laterality: Left;  Left brachio-cephalic fistula  . Insertion of dialysis catheter  05/22/2012    Procedure: INSERTION OF DIALYSIS CATHETER;  Surgeon: Mal Misty, MD;  Location: [place];  Service: Vascular;  Laterality: N/A;  right internal jugular vein  .  Av fistula placement  06/18/2012    Procedure: ARTERIOVENOUS (AV) FISTULA CREATION;  Surgeon: Mal Misty, MD;  Location: [place];  Service: Vascular;  Laterality: Right;  [place]  . Colonoscopy      Hx: of  . Capd insertion N/A 01/14/2013    Procedure: LAPAROSCOPIC INSERTION CONTINUOUS AMBULATORY PERITONEAL DIALYSIS  (CAPD) CATHETER;  Surgeon: Adin Hector, MD;  Location: [place];  Service: General;  Laterality: N/A;  . Vascular surgery    . Removal of a dialysis catheter    . Capd removal N/A 07/09/2013    Procedure: CONTINUOUS AMBULATORY PERITONEAL DIALYSIS  (CAPD) CATHETER REMOVAL;  Surgeon: Adin Hector, MD;  Location: [place];  Service: General;  Laterality: N/A;  . Amputation Bilateral 07/30/2013    Procedure: AMPUTATION BELOW KNEE;  Surgeon: Newt Minion, MD;  Location: [place];  Service: Orthopedics;   Laterality: Bilateral;  Bilateral Below Knee Amputations  . Amputation Bilateral 09/03/2013    Procedure: AMPUTATION BELOW KNEE;  Surgeon: Newt Minion, MD;  Location: [place];  Service: Orthopedics;  Laterality: Bilateral;  Bilateral Below Knee Amputation Revision  . Lower extremity angiogram N/A 06/14/2013    Procedure: LOWER EXTREMITY ANGIOGRAM;  Surgeon: Angelia Mould, MD;  Location: [facility] CATH LAB;  Service: Cardiovascular;  Laterality: N/A;  . Nephrectomy transplanted organ      There were no vitals filed for this visit.  Visit Diagnosis:  Abnormality of gait  Weakness generalized  Impaired functional mobility and activity tolerance  Balance problems                     [facility] Adult PT Treatment/Exercise - 09/02/14 0001    Transfers   Sit to Stand 5: Supervision;With upper extremity assist;With armrests;From chair/3-in-1   Stand to Sit 5: Supervision;With upper extremity assist;With armrests;To chair/3-in-1   Ambulation/Gait   Ambulation/Gait Yes   Ambulation/Gait Assistance 5: Supervision   Ambulation/Gait Assistance Details timed gait for activity tolerance. Pt able to walk for 8 minutes and some seconds before needed a seated rest break (no standing rest breaks were taken). Pt to start a walking program at home via the written instructions provided at todays session.           Assistive device Prostheses;Lofstrands   Gait Pattern Step-through pattern;Decreased step length - right;Decreased step length - left;Decreased stride length;Right hip hike;Left hip hike;Right flexed knee in stance;Left flexed knee in stance;Lateral hip instability;Wide base of support   Ambulation Surface Level;Indoor     Balance HEP Issued an advance HEP for balance at home that includes: 3 laps each, 1x a day High knee marching forward/backward with bil UE support Tandem walking forward/backward with bil UE support Backward walking with 1 UE support Side stepping left/right with  1 to no UE support        PT Education - 09/02/14 2344    Education provided Yes   Education Details walking program and balance HEP   Person(s) Educated Patient   Methods Explanation;Demonstration;Handout   Comprehension Verbalized understanding;Tactile cues required          PT Short Term Goals - 07/28/14 1645    PT SHORT TERM GOAL #1   Title Pt will be able to: Ambulate 500 ft. with forearm crutches and prostheses with supervision. (Target Date: 08/26/2014)   Time 1   Period Months   Status New   PT SHORT TERM GOAL #2   Title Patient will be able to ambulate 50' around  furniture with 1 crutch & prostheses with supervision. (Target Date: 08/26/2014)   Time 1   Period Months   Status New   PT SHORT TERM GOAL #3   Title Pt will be at to:  Perform standing activities for >15 minutes with prostheses with minimal c/o fatigue. (Target Date: 08/26/2014)   Time 1   Period Months   Status New   PT SHORT TERM GOAL #4   Title Pt will be able to reach 10" & within 5" of floor with support of 1 crutch with supervision. (Target Date: 08/26/2014)   Time 1   Period Months   Status New   PT SHORT TERM GOAL #5   Title Pt will be able to: negotiate ramp & curb with forearm crutches & prostheses with supervision. (Target Date: 08/26/2014)   Time 1   Period Months   Status New           PT Long Term Goals - 07/27/14 1630    PT LONG TERM GOAL #1   Title Pt will be able to: Ambulate 839ft with LRAD & prostheses modified independent. (Target Date: 10/14/2014)   Time 12   Period Weeks   Status New   PT LONG TERM GOAL #2   Title Pt will be able to: ambulate 200' on uneven (grass) surfaces with LRAD & prostheses modified independent.  (Target Date: 10/14/2014)   Time 12   Period Weeks   Status New   PT LONG TERM GOAL #3   Title Pt will be able to: negotiate ramp, curb, stairs with LRAD & prostheses modified independent.  (Target Date: 10/14/2014)   Time 12   Period Weeks   Status New   PT  LONG TERM GOAL #4   Title Pt will be able to: perform standing activities for > 20 minutes & prostheses with no pain or discomfort.  (Target Date: 10/14/2014)   Time 12   Period Weeks   Status New   PT LONG TERM GOAL #5   Title Berg Balance >/= 45/56 (Target Date: 10/14/2014)   Time 12   Period Weeks   Status New   PT LONG TERM GOAL #6   Title Patient will ambulate in home simulated (100' and single step) with 1 crutch or less restrictive device modified independent. (Target Date: 10/14/2014)   Time 12   Period Weeks   Status New        09/02/14 1321  Plan  Clinical Impression Statement Issued walking program and advanced balance HEP today without issues other than mild fatigue reported.  Pt will benefit from skilled therapeutic intervention in order to improve on the following deficits Abnormal gait;Decreased activity tolerance;Decreased balance;Decreased endurance;Decreased mobility;Decreased strength  Rehab Potential Good  PT Frequency 2x / week  PT Duration 12 weeks  PT Treatment/Interventions ADLs/Self Care Home Management;Gait training;Stair training;Functional mobility training;Therapeutic activities;Therapeutic exercise;Balance training;Neuromuscular re-education;Patient/family education  PT Next Visit Plan continue towards goals  Consulted and Agree with Plan of Care Patient;Family member/caregiver  Family Member Consulted wife    Problem List Patient Active Problem List   Diagnosis Date Noted  . Abnormal cardiovascular stress test 12/29/2013  . Awaiting organ transplant 12/09/2013  . Leg ulcer 11/17/2013  . Chronic kidney disease (CKD), stage V 11/17/2013  . Diabetes 11/17/2013  . Cancer of duodenum 11/17/2013  . Dyslipidemia 11/17/2013  . Chronic kidney disease requiring chronic dialysis 11/17/2013  . Hypercholesteremia 11/17/2013  . H/O malignant neoplasm 11/17/2013  . H/O: HTN (hypertension) 11/17/2013  .  BP (high blood pressure) 11/17/2013  . Hypotension  11/17/2013  . Angiopathy, peripheral 11/17/2013  . AF (paroxysmal atrial fibrillation) 11/17/2013  . BKA stump complication 59/01/3111  . S/P bilateral BKA (below knee amputation) 08/03/2013  . S/P BKA (below knee amputation) bilateral 07/30/2013  . Open wound of both legs with complication 16/24/4695  . Acute blood loss anemia 07/12/2013  . Syncope 07/09/2013  . Intolerance to CAPD peritoneal dialysis s/p CAPD cath removal 07/09/2013 07/05/2013  . Critical lower limb ischemia 06/30/2013  . Extremity pain 06/30/2013  . Atherosclerosis of native arteries of the extremities with ulceration(440.23) 06/17/2013  . Gait disorder 05/31/2013  . Weakness 05/24/2013  . Depression 04/03/2013  . GERD (gastroesophageal reflux disease) 04/03/2013  . Atrial fibrillation 04/02/2013  . DM (diabetes mellitus) 12/28/2012  . Other complications due to renal dialysis device, implant, and graft 05/19/2012  . Diabetes mellitus, type 2 11/13/2011  . Anemia 07/02/2011  . ESRD on hemodialysis 05/29/2011    Willow Ora 09/04/2014, 11:45 PM  Willow Ora, PTA, [place] [address] [place], [place] [address] [phone] 09/04/2014, 11:45 PM

## 2014-09-05 ENCOUNTER — Ambulatory Visit: Payer: Managed Care, Other (non HMO) | Admitting: Physical Therapy

## 2014-09-05 ENCOUNTER — Encounter: Payer: Self-pay | Admitting: Physical Therapy

## 2014-09-05 DIAGNOSIS — Z89512 Acquired absence of left leg below knee: Secondary | ICD-10-CM

## 2014-09-05 DIAGNOSIS — R269 Unspecified abnormalities of gait and mobility: Secondary | ICD-10-CM

## 2014-09-05 DIAGNOSIS — Z89511 Acquired absence of right leg below knee: Secondary | ICD-10-CM

## 2014-09-05 DIAGNOSIS — R2689 Other abnormalities of gait and mobility: Secondary | ICD-10-CM

## 2014-09-05 DIAGNOSIS — R531 Weakness: Secondary | ICD-10-CM

## 2014-09-05 DIAGNOSIS — Z7409 Other reduced mobility: Secondary | ICD-10-CM

## 2014-09-05 NOTE — Therapy (Signed)
[facility] [address] [place], [place], [address] Phone: [phone]   Fax:  [phone]  Physical Therapy Treatment  Patient Details  Name: Paul [name] MRN: [id number] Date of Birth: April 10, 1956 Referring Provider:  [name], MD  Encounter Date: 09/05/2014      PT End of Session - 09/05/14 0845    Visit Number 6   Number of Visits 17   Date for PT Re-Evaluation 09/23/14   PT Start Time 0845   PT Stop Time 0932   PT Time Calculation (min) 47 min   Equipment Utilized During Treatment Gait belt   Activity Tolerance Patient tolerated treatment well   Behavior During Therapy [facility] for tasks assessed/performed      Past Medical History  Diagnosis Date  . Open wound of both legs with complication     Pt has had progressive wounds of both LE's including gangrene of the toes and patchy necrosis of the calves.  He has been treated at [facility] by Dr Jerline Pain with hyperbaric O2 5d per week.  He is getting Na Thiosulfate with HD for suspected calciphylaxis. Pt says doctor's aren't sure if these ulcers were diabetic ulcers or calciphylaxis.  He underwent bilat BKA on 07/30/13.  He says that the woun  . ESRD on hemodialysis     Pt has ESRD due to DM.  He had a L upper arm AVF prior to starting HD which was ligated due to L arm steal syndrome.  He started HD in Jan 2014 and did home HD.  The family couldn't cannulate the R arm AVF successfully so by mid 2014 they decided to switch to PD which was done late summer 2014.  In Jan 2015 he was admitted with FTT felt to be due to underdialysis on PD and PD was abandoned and he   . Anemia     esrd  . Hypertension     off of meds due to orthostatic hypotension  . Closed left arm fracture 1967  . Heart murmur   . Corneal abrasion, left   . GERD (gastroesophageal reflux disease)   . History of blood transfusion   . Glomerulosclerosis, diabetic   . Depression   . A-fib   .  Calciphylaxis 05/2013    lower ext  . Diabetes mellitus     Type 1 iddm x 11 yrs  . Diabetic neuropathy   . Cancer     hx duodenal adenoCa 2002, resected    Past Surgical History  Procedure Laterality Date  . Whipple procedure  2002    duodenal ca, Dr. Harlow Asa  . Bone marrow biopsy  2012  . Renal biopsy, percutaneous  2012  . Tonsillectomy    . Portacath placement  2002  . Biliary diversion, external  2002  . Other surgical history      Cyst removed from back  . Av fistula placement  06/07/2011    Procedure: ARTERIOVENOUS (AV) FISTULA CREATION;  Surgeon: Angelia Mould, MD;  Location: [place];  Service: Vascular;  Laterality: Left;  . Ligation of arteriovenous  fistula  05/22/2012    Procedure: LIGATION OF ARTERIOVENOUS  FISTULA;  Surgeon: Mal Misty, MD;  Location: [place];  Service: Vascular;  Laterality: Left;  Left brachio-cephalic fistula  . Insertion of dialysis catheter  05/22/2012    Procedure: INSERTION OF DIALYSIS CATHETER;  Surgeon: Mal Misty, MD;  Location: [place];  Service: Vascular;  Laterality: N/A;  right internal  jugular vein  . Av fistula placement  06/18/2012    Procedure: ARTERIOVENOUS (AV) FISTULA CREATION;  Surgeon: Mal Misty, MD;  Location: [place];  Service: Vascular;  Laterality: Right;  [place]  . Colonoscopy      Hx: of  . Capd insertion N/A 01/14/2013    Procedure: LAPAROSCOPIC INSERTION CONTINUOUS AMBULATORY PERITONEAL DIALYSIS  (CAPD) CATHETER;  Surgeon: Adin Hector, MD;  Location: [place];  Service: General;  Laterality: N/A;  . Vascular surgery    . Removal of a dialysis catheter    . Capd removal N/A 07/09/2013    Procedure: CONTINUOUS AMBULATORY PERITONEAL DIALYSIS  (CAPD) CATHETER REMOVAL;  Surgeon: Adin Hector, MD;  Location: [place];  Service: General;  Laterality: N/A;  . Amputation Bilateral 07/30/2013    Procedure: AMPUTATION BELOW KNEE;  Surgeon: Newt Minion, MD;  Location: [place];  Service: Orthopedics;  Laterality: Bilateral;   Bilateral Below Knee Amputations  . Amputation Bilateral 09/03/2013    Procedure: AMPUTATION BELOW KNEE;  Surgeon: Newt Minion, MD;  Location: [place];  Service: Orthopedics;  Laterality: Bilateral;  Bilateral Below Knee Amputation Revision  . Lower extremity angiogram N/A 06/14/2013    Procedure: LOWER EXTREMITY ANGIOGRAM;  Surgeon: Angelia Mould, MD;  Location: [facility] CATH LAB;  Service: Cardiovascular;  Laterality: N/A;  . Nephrectomy transplanted organ      There were no vitals filed for this visit.  Visit Diagnosis:  Abnormality of gait  Weakness generalized  Impaired functional mobility and activity tolerance  Balance problems  S/P bilateral BKA (below knee amputation)      Subjective Assessment - 09/05/14 0852    Subjective Exercised this weekend by walking on ramp and in neighborhood with both crutches and wife.   Currently in Pain? No/denies                       [facility] Adult PT Treatment/Exercise - 09/05/14 0845    Transfers   Sit to Stand 5: Supervision;With upper extremity assist;With armrests;From chair/3-in-1;4: Min guard  standing from 24" stool without UE support with min guard   Sit to Stand Details (indicate cue type and reason) cues on wt shift over feet first   Stand to Sit 5: Supervision;With upper extremity assist;With armrests;To chair/3-in-1;4: Min guard  sit down on 24" stool without UE assist with min guard   Ambulation/Gait   Ambulation/Gait Yes   Ambulation/Gait Assistance 5: Supervision;4: Min assist;4: Min guard  SBA 2 crutches, Min guard 1 crutch, MinA no device   Ambulation/Gait Assistance Details cued on using 1 crutch in home with wife otherwise RW, 2 crutches in community   Ambulation Distance (Feet) 100 Feet  100' & 150' with 1 crutch, 10' X 2 parallel bars w/ no devic   Assistive device Prostheses;Lofstrands   Gait Pattern Step-through pattern;Decreased step length - right;Decreased step length - left;Decreased stride  length;Right flexed knee in stance;Left flexed knee in stance;Lateral hip instability;Wide base of support   Ambulation Surface Indoor;Level   Gait velocity - backwards 10' X2 in parallel bars with minA   Stairs Yes   Stairs Assistance 5: Supervision   Stairs Assistance Details (indicate cue type and reason) cues on wt shift   Stair Management Technique Two rails;Forwards;Step to pattern  alternating lead limb   Number of Stairs 4  X2   Ramp 4: Min assist  1 crutch & prostheses   Ramp Details (indicate cue type and reason) cues on  posture & maintain momentum   Curb 2: Max assist  1 crutch & prostheses on 7" step   Curb Details (indicate cue type and reason) cues on technique   Dynamic Standing Balance   Dynamic Standing - Balance Support No upper extremity supported  on 24" stool for modified stand & in parallel bars   Dynamic Standing - Level of Assistance 5: Stand by assistance   Dynamic Standing - Balance Activities Lateral lean/weight shifting;Forward lean/weight shifting;Reaching for weighted objects  trunk rotation & side bends,    Dynamic Standing - Comments blue theraband reciprocal & BUE row, upward reach & forward reach   Knee/Hip Exercises: Standing   Lateral Step Up Right;Left;5 reps;Hand Hold: 2;Step Height: 4"   Forward Step Up Right;Left;5 reps;Hand Hold: 2;Step Height: 4"   Step Down Right;Left;5 reps;Hand Hold: 2;Step Height: 4"   Other Standing Knee Exercises straddle down on 4" block 5 reps with RLE, 5reps with LLE   Prosthetics   Prosthetic Care Comments  PT advised to see prosthetist for alignment check   Current prosthetic wear tolerance (days/week)  7 days /wk   Current prosthetic wear tolerance (#hours/day)  reports wear when out of bed but only out of bed ~4-8 hours/day   Residual limb condition  pt reports intact                PT Education - 09/05/14 0953    Education provided Yes   Education Details walking with 1 crutch with wife in home, see  prosthetist for alignment check   Person(s) Educated Patient;Spouse   Methods Explanation   Comprehension Verbalized understanding          PT Short Term Goals - 09/05/14 0845    PT SHORT TERM GOAL #1   Title Pt will be able to: Ambulate 500 ft. with forearm crutches and prostheses with supervision. (Target Date: 09/09/2014)   Time 1   Period Months   Status On-going   PT SHORT TERM GOAL #2   Title Patient will be able to ambulate 81' around furniture with 1 crutch & prostheses with supervision.  (Target Date: 09/09/2014)   Time 1   Period Months   Status On-going   PT SHORT TERM GOAL #3   Title Pt will be at to:  Perform standing activities for >15 minutes with prostheses with minimal c/o fatigue.  (Target Date: 09/09/2014)   Time 1   Period Months   Status On-going   PT SHORT TERM GOAL #4   Title Pt will be able to reach 10" & within 5" of floor with support of 1 crutch with supervision. (Target Date: 09/09/2014)   Time 1   Period Months   Status On-going   PT SHORT TERM GOAL #5   Title Pt will be able to: negotiate ramp & curb with forearm crutches & prostheses with supervision.  (Target Date: 09/09/2014)   Time 1   Period Months   Status On-going           PT Long Term Goals - 07/27/14 1630    PT LONG TERM GOAL #1   Title Pt will be able to: Ambulate 883ft with LRAD & prostheses modified independent. (Target Date: 10/14/2014)   Time 12   Period Weeks   Status New   PT LONG TERM GOAL #2   Title Pt will be able to: ambulate 200' on uneven (grass) surfaces with LRAD & prostheses modified independent.  (Target Date: 10/14/2014)   Time 12  Period Weeks   Status New   PT LONG TERM GOAL #3   Title Pt will be able to: negotiate ramp, curb, stairs with LRAD & prostheses modified independent.  (Target Date: 10/14/2014)   Time 12   Period Weeks   Status New   PT LONG TERM GOAL #4   Title Pt will be able to: perform standing activities for > 20 minutes & prostheses with no  pain or discomfort.  (Target Date: 10/14/2014)   Time 12   Period Weeks   Status New   PT LONG TERM GOAL #5   Title Berg Balance >/= 45/56 (Target Date: 10/14/2014)   Time 12   Period Weeks   Status New   PT LONG TERM GOAL #6   Title Patient will ambulate in home simulated (100' and single step) with 1 crutch or less restrictive device modified independent. (Target Date: 10/14/2014)   Time 12   Period Weeks   Status New               Plan - 09/05/14 0845    Clinical Impression Statement Patient's strength is primary limiting factor on curbs. Patient is progressing strength slowly but as anticipated. Patient's right UE appears swollen and they will check with coordinator at [facility] for who should check it.   Pt will benefit from skilled therapeutic intervention in order to improve on the following deficits Abnormal gait;Decreased activity tolerance;Decreased balance;Decreased endurance;Decreased mobility;Decreased strength   Rehab Potential Good   PT Frequency 2x / week   PT Duration 12 weeks   PT Treatment/Interventions ADLs/Self Care Home Management;Gait training;Stair training;Functional mobility training;Therapeutic activities;Therapeutic exercise;Balance training;Neuromuscular re-education;Patient/family education   PT Next Visit Plan Check STGs   Consulted and Agree with Plan of Care Patient;Family member/caregiver   Family Member Consulted wife        Problem List Patient Active Problem List   Diagnosis Date Noted  . Abnormal cardiovascular stress test 12/29/2013  . Awaiting organ transplant 12/09/2013  . Leg ulcer 11/17/2013  . Chronic kidney disease (CKD), stage V 11/17/2013  . Diabetes 11/17/2013  . Cancer of duodenum 11/17/2013  . Dyslipidemia 11/17/2013  . Chronic kidney disease requiring chronic dialysis 11/17/2013  . Hypercholesteremia 11/17/2013  . H/O malignant neoplasm 11/17/2013  . H/O: HTN (hypertension) 11/17/2013  . BP (high blood pressure)  11/17/2013  . Hypotension 11/17/2013  . Angiopathy, peripheral 11/17/2013  . AF (paroxysmal atrial fibrillation) 11/17/2013  . BKA stump complication 57/97/2820  . S/P bilateral BKA (below knee amputation) 08/03/2013  . S/P BKA (below knee amputation) bilateral 07/30/2013  . Open wound of both legs with complication 60/15/6153  . Acute blood loss anemia 07/12/2013  . Syncope 07/09/2013  . Intolerance to CAPD peritoneal dialysis s/p CAPD cath removal 07/09/2013 07/05/2013  . Critical lower limb ischemia 06/30/2013  . Extremity pain 06/30/2013  . Atherosclerosis of native arteries of the extremities with ulceration(440.23) 06/17/2013  . Gait disorder 05/31/2013  . Weakness 05/24/2013  . Depression 04/03/2013  . GERD (gastroesophageal reflux disease) 04/03/2013  . Atrial fibrillation 04/02/2013  . DM (diabetes mellitus) 12/28/2012  . Other complications due to renal dialysis device, implant, and graft 05/19/2012  . Diabetes mellitus, type 2 11/13/2011  . Anemia 07/02/2011  . ESRD on hemodialysis 05/29/2011    Jamey Reas PT, DPT 09/05/2014, 10:02 AM  [facility] [address] [place], [place], [address] Phone: [phone]   Fax:  [phone]

## 2014-09-08 ENCOUNTER — Ambulatory Visit: Payer: Managed Care, Other (non HMO) | Admitting: Physical Therapy

## 2014-09-08 ENCOUNTER — Encounter: Payer: Self-pay | Admitting: Physical Therapy

## 2014-09-08 DIAGNOSIS — Z7409 Other reduced mobility: Secondary | ICD-10-CM

## 2014-09-08 DIAGNOSIS — R269 Unspecified abnormalities of gait and mobility: Secondary | ICD-10-CM | POA: Diagnosis not present

## 2014-09-08 DIAGNOSIS — R2689 Other abnormalities of gait and mobility: Secondary | ICD-10-CM

## 2014-09-08 DIAGNOSIS — R531 Weakness: Secondary | ICD-10-CM

## 2014-09-08 NOTE — Therapy (Signed)
[facility] [address] [place], [place], [address] Phone: [phone]   Fax:  [phone]  Physical Therapy Treatment  Patient Details  Name: Paul [name] MRN: [id number] Date of Birth: 08-22-1955 Referring Provider:  [name], MD  Encounter Date: 09/08/2014      PT End of Session - 09/08/14 0859    Visit Number 7   Number of Visits 17   Date for PT Re-Evaluation 09/23/14   PT Start Time 0804   PT Stop Time 0843   PT Time Calculation (min) 39 min   Equipment Utilized During Treatment Gait belt   Activity Tolerance Patient tolerated treatment well   Behavior During Therapy [facility] for tasks assessed/performed      Past Medical History  Diagnosis Date  . Open wound of both legs with complication     Pt has had progressive wounds of both LE's including gangrene of the toes and patchy necrosis of the calves.  He has been treated at [facility] by Dr Jerline Pain with hyperbaric O2 5d per week.  He is getting Na Thiosulfate with HD for suspected calciphylaxis. Pt says doctor's aren't sure if these ulcers were diabetic ulcers or calciphylaxis.  He underwent bilat BKA on 07/30/13.  He says that the woun  . ESRD on hemodialysis     Pt has ESRD due to DM.  He had a L upper arm AVF prior to starting HD which was ligated due to L arm steal syndrome.  He started HD in Jan 2014 and did home HD.  The family couldn't cannulate the R arm AVF successfully so by mid 2014 they decided to switch to PD which was done late summer 2014.  In Jan 2015 he was admitted with FTT felt to be due to underdialysis on PD and PD was abandoned and he   . Anemia     esrd  . Hypertension     off of meds due to orthostatic hypotension  . Closed left arm fracture 1967  . Heart murmur   . Corneal abrasion, left   . GERD (gastroesophageal reflux disease)   . History of blood transfusion   . Glomerulosclerosis, diabetic   . Depression   . A-fib   .  Calciphylaxis 05/2013    lower ext  . Diabetes mellitus     Type 1 iddm x 11 yrs  . Diabetic neuropathy   . Cancer     hx duodenal adenoCa 2002, resected    Past Surgical History  Procedure Laterality Date  . Whipple procedure  2002    duodenal ca, Dr. Harlow Asa  . Bone marrow biopsy  2012  . Renal biopsy, percutaneous  2012  . Tonsillectomy    . Portacath placement  2002  . Biliary diversion, external  2002  . Other surgical history      Cyst removed from back  . Av fistula placement  06/07/2011    Procedure: ARTERIOVENOUS (AV) FISTULA CREATION;  Surgeon: Angelia Mould, MD;  Location: [facility];  Service: Vascular;  Laterality: Left;  . Ligation of arteriovenous  fistula  05/22/2012    Procedure: LIGATION OF ARTERIOVENOUS  FISTULA;  Surgeon: Mal Misty, MD;  Location: [facility];  Service: Vascular;  Laterality: Left;  Left brachio-cephalic fistula  . Insertion of dialysis catheter  05/22/2012    Procedure: INSERTION OF DIALYSIS CATHETER;  Surgeon: Mal Misty, MD;  Location: [facility];  Service: Vascular;  Laterality: N/A;  right internal  jugular vein  . Av fistula placement  06/18/2012    Procedure: ARTERIOVENOUS (AV) FISTULA CREATION;  Surgeon: Mal Misty, MD;  Location: [facility];  Service: Vascular;  Laterality: Right;  [place]  . Colonoscopy      Hx: of  . Capd insertion N/A 01/14/2013    Procedure: LAPAROSCOPIC INSERTION CONTINUOUS AMBULATORY PERITONEAL DIALYSIS  (CAPD) CATHETER;  Surgeon: Adin Hector, MD;  Location: [facility];  Service: General;  Laterality: N/A;  . Vascular surgery    . Removal of a dialysis catheter    . Capd removal N/A 07/09/2013    Procedure: CONTINUOUS AMBULATORY PERITONEAL DIALYSIS  (CAPD) CATHETER REMOVAL;  Surgeon: Adin Hector, MD;  Location: [facility];  Service: General;  Laterality: N/A;  . Amputation Bilateral 07/30/2013    Procedure: AMPUTATION BELOW KNEE;  Surgeon: Newt Minion, MD;  Location: [facility];  Service: Orthopedics;  Laterality: Bilateral;   Bilateral Below Knee Amputations  . Amputation Bilateral 09/03/2013    Procedure: AMPUTATION BELOW KNEE;  Surgeon: Newt Minion, MD;  Location: [facility];  Service: Orthopedics;  Laterality: Bilateral;  Bilateral Below Knee Amputation Revision  . Lower extremity angiogram N/A 06/14/2013    Procedure: LOWER EXTREMITY ANGIOGRAM;  Surgeon: Angelia Mould, MD;  Location: [facility] CATH LAB;  Service: Cardiovascular;  Laterality: N/A;  . Nephrectomy transplanted organ      There were no vitals filed for this visit.  Visit Diagnosis:  Abnormality of gait  Weakness generalized  Impaired functional mobility and activity tolerance  Balance problems      Subjective Assessment - 09/08/14 0856    Subjective Reports he is still doing his balance HEP and his walking program at home. Using single crutch in home mostly, 2 crutches outside of home. No falls or pain. Did call his transplant coordinator about his right UE swelling. They said to continue to monitor it and call if it got worse. Reports swelling is ususally down in the am, however increases as the day goes on to be pretty swollen by end of day. Pt to call his vasular surgeon and have it checked out by him due to this is the arm with his dialysis shunt.     Currently in Pain? No/denies   Pain Score 0-No pain            [facility] Adult PT Treatment/Exercise - 09/08/14 0904    Transfers   Sit to Stand 5: Supervision;With upper extremity assist;With armrests;From chair/3-in-1;4: Min guard   Stand to Sit 5: Supervision;With upper extremity assist;With armrests;To chair/3-in-1;4: Min guard   Ambulation/Gait   Ambulation/Gait Yes   Ambulation/Gait Assistance 4: Min assist;4: Min guard   Ambulation Distance (Feet) 220 Feet  x2 reps   Assistive device Prostheses;Lofstrands  single crutch with gait   Gait Pattern Step-through pattern;Decreased step length - right;Decreased step length - left;Decreased stride length;Right flexed knee in stance;Left  flexed knee in stance;Lateral hip instability;Wide base of support   Ambulation Surface Level;Indoor   Ramp 4: Min assist;3: Mod assist  single crutch/prostheses   Ramp Details (indicate cue type and reason) cues on sequence and posture.    Curb 3: Mod assist  single crutch/prostheses   Curb Details (indicate cue type and reason) modified to two 3 inch curbs: cues on sequence and to power through leg to lift other leg up onto curb.                    Dynamic Standing Balance  Dynamic Standing - Balance Support Bilateral upper extremity supported;During functional activity  light UE support   Dynamic Standing - Level of Assistance 5: Stand by assistance   Dynamic Standing - Balance Activities Alternating  foot traps;Foam balance beam   Dynamic Standing - Comments standing with feet across blue foam balance beam: alternating forward foot taps and backward foot taps x 10 each bil legs.                                 High Level Balance   High Level Balance Activities Side stepping;Tandem walking;Marching forwards;Marching backwards   High Level Balance Comments forward/backward marching with intermittent UE support in parallel bars x 3 laps each way; on blue foam beam: forward/backward tandem walking and side stepping x 3 laps each way with intermittent UE support on bars.                       Knee/Hip Exercises: Machines for Strengthening   Cybex Leg Press both legs 80# 5 sec hold x 10 reps; single legs 50# 5 sec hold x 10 reps each leg           PT Short Term Goals - 09/08/14 0901    PT SHORT TERM GOAL #1   Title Pt will be able to: Ambulate 500 ft. with forearm crutches and prostheses with supervision. (Target Date: 09/09/2014)   Status On-going   PT SHORT TERM GOAL #2   Title Patient will be able to ambulate 21' around furniture with 1 crutch & prostheses with supervision.  (Target Date: 09/09/2014)   Status On-going   PT SHORT TERM GOAL #3   Title Pt will be at to:  Perform  standing activities for >15 minutes with prostheses with minimal c/o fatigue.  (Target Date: 09/09/2014)   Status Not Met   PT SHORT TERM GOAL #4   Title Pt will be able to reach 10" & within 5" of floor with support of 1 crutch with supervision. (Target Date: 09/09/2014)   Time 1   Period Months   Status On-going   PT SHORT TERM GOAL #5   Title Pt will be able to: negotiate ramp & curb with forearm crutches & prostheses with supervision.  (Target Date: 09/09/2014)   Status Not Met           PT Long Term Goals - 07/27/14 1630    PT LONG TERM GOAL #1   Title Pt will be able to: Ambulate 889f with LRAD & prostheses modified independent. (Target Date: 10/14/2014)   Time 12   Period Weeks   Status New   PT LONG TERM GOAL #2   Title Pt will be able to: ambulate 200' on uneven (grass) surfaces with LRAD & prostheses modified independent.  (Target Date: 10/14/2014)   Time 12   Period Weeks   Status New   PT LONG TERM GOAL #3   Title Pt will be able to: negotiate ramp, curb, stairs with LRAD & prostheses modified independent.  (Target Date: 10/14/2014)   Time 12   Period Weeks   Status New   PT LONG TERM GOAL #4   Title Pt will be able to: perform standing activities for > 20 minutes & prostheses with no pain or discomfort.  (Target Date: 10/14/2014)   Time 12   Period Weeks   Status New   PT LONG TERM GOAL #5  Title Berg Balance >/= 45/56 (Target Date: 10/14/2014)   Time 12   Period Weeks   Status New   PT LONG TERM GOAL #6   Title Patient will ambulate in home simulated (100' and single step) with 1 crutch or less restrictive device modified independent. (Target Date: 10/14/2014)   Time 12   Period Weeks   Status New           Plan - 09/08/14 0900    Clinical Impression Statement Pt making steady progress toward goals. Continues to be limited by strength and low activity tolerance.   Pt will benefit from skilled therapeutic intervention in order to improve on the following  deficits Abnormal gait;Decreased activity tolerance;Decreased balance;Decreased endurance;Decreased mobility;Decreased strength   Rehab Potential Good   PT Frequency 2x / week   PT Duration 12 weeks   PT Treatment/Interventions ADLs/Self Care Home Management;Gait training;Stair training;Functional mobility training;Therapeutic activities;Therapeutic exercise;Balance training;Neuromuscular re-education;Patient/family education   PT Next Visit Plan Check STGs   Consulted and Agree with Plan of Care Patient;Family member/caregiver   Family Member Consulted wife      Problem List Patient Active Problem List   Diagnosis Date Noted  . Abnormal cardiovascular stress test 12/29/2013  . Awaiting organ transplant 12/09/2013  . Leg ulcer 11/17/2013  . Chronic kidney disease (CKD), stage V 11/17/2013  . Diabetes 11/17/2013  . Cancer of duodenum 11/17/2013  . Dyslipidemia 11/17/2013  . Chronic kidney disease requiring chronic dialysis 11/17/2013  . Hypercholesteremia 11/17/2013  . H/O malignant neoplasm 11/17/2013  . H/O: HTN (hypertension) 11/17/2013  . BP (high blood pressure) 11/17/2013  . Hypotension 11/17/2013  . Angiopathy, peripheral 11/17/2013  . AF (paroxysmal atrial fibrillation) 11/17/2013  . BKA stump complication 06/30/1550  . S/P bilateral BKA (below knee amputation) 08/03/2013  . S/P BKA (below knee amputation) bilateral 07/30/2013  . Open wound of both legs with complication 12/20/2334  . Acute blood loss anemia 07/12/2013  . Syncope 07/09/2013  . Intolerance to CAPD peritoneal dialysis s/p CAPD cath removal 07/09/2013 07/05/2013  . Critical lower limb ischemia 06/30/2013  . Extremity pain 06/30/2013  . Atherosclerosis of native arteries of the extremities with ulceration(440.23) 06/17/2013  . Gait disorder 05/31/2013  . Weakness 05/24/2013  . Depression 04/03/2013  . GERD (gastroesophageal reflux disease) 04/03/2013  . Atrial fibrillation 04/02/2013  . DM (diabetes  mellitus) 12/28/2012  . Other complications due to renal dialysis device, implant, and graft 05/19/2012  . Diabetes mellitus, type 2 11/13/2011  . Anemia 07/02/2011  . ESRD on hemodialysis 05/29/2011    Willow Ora 09/08/2014, 9:17 AM  Willow Ora, PTA, [facility] [address], [place], [place] [address] [phone] 09/08/2014, 9:18 AM

## 2014-09-09 ENCOUNTER — Ambulatory Visit (INDEPENDENT_AMBULATORY_CARE_PROVIDER_SITE_OTHER): Payer: Managed Care, Other (non HMO) | Admitting: Vascular Surgery

## 2014-09-09 ENCOUNTER — Encounter: Payer: Self-pay | Admitting: Vascular Surgery

## 2014-09-09 ENCOUNTER — Ambulatory Visit (HOSPITAL_COMMUNITY)
Admission: RE | Admit: 2014-09-09 | Discharge: 2014-09-09 | Disposition: A | Discharge status: Home / Self Care | Payer: Managed Care, Other (non HMO) | Source: Ambulatory Visit | Attending: Vascular Surgery | Admitting: Vascular Surgery

## 2014-09-09 VITALS — BP 164/79 | HR 68 | Temp 98.2°F | Resp 16 | Ht 72.0 in | Wt 160.0 lb

## 2014-09-09 DIAGNOSIS — N186 End stage renal disease: Secondary | ICD-10-CM | POA: Diagnosis not present

## 2014-09-09 DIAGNOSIS — M7989 Other specified soft tissue disorders: Secondary | ICD-10-CM

## 2014-09-09 NOTE — Progress Notes (Signed)
[facility]  Patient name: Paul [name] MRN: [id number] DOB: May 29, 1955 Sex: male  REASON FOR VISIT: right arm swelling  HPI: Paul [name] is a 59 y.o. male who had a right radial cephalic AV fistula placed in January 2014. He subsequently underwent renal transplant in January 2016. The fistula is no longer used. He developed some moderate right arm swelling about a week ago. He is being treated with Cipro for a UTI. His  Kidney transplant has been working well.  He denies any uremic symptoms. Paul [name] denies nausea, vomiting, fatigue, anorexia, or palpitations.   Past Medical History  Diagnosis Date  . Open wound of both legs with complication     Pt has had progressive wounds of both LE's including gangrene of the toes and patchy necrosis of the calves.  He has been treated at [facility] by Dr Jerline Pain with hyperbaric O2 5d per week.  He is getting Na Thiosulfate with HD for suspected calciphylaxis. Pt says doctor's aren't sure if these ulcers were diabetic ulcers or calciphylaxis.  He underwent bilat BKA on 07/30/13.  He says that the woun  . ESRD on hemodialysis     Pt has ESRD due to DM.  He had a L upper arm AVF prior to starting HD which was ligated due to L arm steal syndrome.  He started HD in Jan 2014 and did home HD.  The family couldn't cannulate the R arm AVF successfully so by mid 2014 they decided to switch to PD which was done late summer 2014.  In Jan 2015 he was admitted with FTT felt to be due to underdialysis on PD and PD was abandoned and he   . Anemia     esrd  . Hypertension     off of meds due to orthostatic hypotension  . Closed left arm fracture 1967  . Heart murmur   . Corneal abrasion, left   . GERD (gastroesophageal reflux disease)   . History of blood transfusion   . Glomerulosclerosis, diabetic   . Depression   . A-fib   . Calciphylaxis 05/2013    lower ext  . Diabetes mellitus     Type 1 iddm x 11 yrs  . Diabetic  neuropathy   . Cancer     hx duodenal adenoCa 2002, resected   Family History  Problem Relation Age of Onset  . Cancer Mother   . Cancer Father     prostate cancer  . Heart disease Maternal Grandfather   . Stroke Paternal Grandmother    SOCIAL HISTORY: History  Substance Use Topics  . Smoking status: Never Smoker   . Smokeless tobacco: Never Used  . Alcohol Use: No   Allergies  Allergen Reactions  . Metformin And Related Other (See Comments)    Kidney problems   Current Outpatient Prescriptions  Medication Sig Dispense Refill  . acyclovir (ZOVIRAX) 400 MG tablet Take 400 mg by mouth 2 (two) times daily.     [name] aspirin EC 81 MG tablet Take 81 mg by mouth daily.    [name] atorvastatin (LIPITOR) 10 MG tablet Take 1 tablet (10 mg total) by mouth daily. 30 tablet 1  . Cholecalciferol 1000 UNITS tablet Take by mouth.    . ciprofloxacin (CIPRO) 500 MG tablet Take 500 mg by mouth 2 (two) times daily.    . insulin glargine (LANTUS) 100 UNIT/ML injection Inject 14 Units into the skin at bedtime.     [name]  insulin lispro (HUMALOG) 100 UNIT/ML injection Inject 4 Units into the skin 3 (three) times daily before meals.    . magnesium chloride (SLOW-MAG) 64 MG TBEC SR tablet Take by mouth.    . metoCLOPramide (REGLAN) 5 MG tablet Take 5 mg by mouth 3 (three) times daily before meals.    . multivitamin (RENA-VIT) TABS tablet Take 1 tablet by mouth at bedtime. 30 tablet 1  . mycophenolate (MYFORTIC) 180 MG EC tablet Take 180 mg by mouth 2 (two) times daily.    [name] omeprazole (PRILOSEC) 40 MG capsule     . predniSONE (DELTASONE) 5 MG tablet Take 5 mg by mouth daily with breakfast.     . tacrolimus (PROGRAF) 5 MG capsule Take 6 mg by mouth 2 (two) times daily.    . tamsulosin (FLOMAX) 0.4 MG CAPS capsule Take 0.4 mg by mouth daily after breakfast.     . magnesium oxide (MAG-OX) 400 MG tablet Take 800 mg by mouth 2 (two) times daily.      No current facility-administered medications for this visit.    REVIEW OF SYSTEMS: Valu.Nieves ] denotes positive finding; [  ] denotes negative finding  CARDIOVASCULAR:  [ ]  chest pain   [ ]  chest pressure   [ ]  palpitations   [ ]  orthopnea   [ ]  dyspnea on exertion   [ ]  claudication   [ ]  rest pain   [ ]  DVT   [ ]  phlebitis PULMONARY:   [ ]  productive cough   [ ]  asthma   [ ]  wheezing NEUROLOGIC:   [ ]  weakness  [ ]  paresthesias  [ ]  aphasia  [ ]  amaurosis  [ ]  dizziness HEMATOLOGIC:   [ ]  bleeding problems   [ ]  clotting disorders MUSCULOSKELETAL:  [ ]  joint pain   [ ]  joint swelling [ ]  leg swelling GASTROINTESTINAL: [ ]   blood in stool  [ ]   hematemesis GENITOURINARY:  [ ]   dysuria  [ ]   hematuria PSYCHIATRIC:  [ ]  history of major depression INTEGUMENTARY:  [ ]  rashes  [ ]  ulcers CONSTITUTIONAL:  [ ]  fever   [ ]  chills  PHYSICAL EXAM: Filed Vitals:   09/09/14 0840  BP: 164/79  Pulse: 68  Temp: 98.2 F (36.8 C)  TempSrc: Oral  Resp: 16  Height: 6' (1.829 m)  Weight: 160 lb (72.576 kg)  SpO2: 99%   GENERAL: The patient is a well-nourished male, in no acute distress. The vital signs are documented above. CARDIOVASCULAR: There is a regular rate and rhythm. I do not detect carotid bruits. His fistula has a good thrill. He has mild right upper extremity swelling. The fistula is not pulsatile. PULMONARY: There is good air exchange bilaterally without wheezing or rales. ABDOMEN: Soft and non-tender with normal pitched bowel sounds.  MUSCULOSKELETAL: he has bilateral lower extremity amputations. NEUROLOGIC: No focal weakness or paresthesias are detected. SKIN: There are no ulcers or rashes noted. PSYCHIATRIC: The patient has a normal affect.  DATA:  VENOUS DUPLEX: I have independently interpreted the venous duplex of his right upper extremity which shows no evidence of right upper extremity DVT.  MEDICAL ISSUES:  RIGHT UPPER EXTREMITY SWELLING:  I reassured the patient that he has no evidence of right upper extremity DVT. The fistula is working  well and the patient only has mild swelling in the arm. I certainly would not recommend ligation given that he should have a backup in case he had issues with his renal transplant.  Likewise a see no evidence of infection. I have encouraged him to elevate his arm. We will see him back as needed.  [facility] [place] Beeper: [phone]

## 2014-09-12 ENCOUNTER — Ambulatory Visit: Payer: Managed Care, Other (non HMO) | Admitting: Physical Therapy

## 2014-09-13 ENCOUNTER — Ambulatory Visit: Payer: Managed Care, Other (non HMO) | Admitting: Physical Therapy

## 2014-09-13 DIAGNOSIS — R531 Weakness: Secondary | ICD-10-CM

## 2014-09-13 DIAGNOSIS — R269 Unspecified abnormalities of gait and mobility: Secondary | ICD-10-CM

## 2014-09-13 DIAGNOSIS — Z7409 Other reduced mobility: Secondary | ICD-10-CM

## 2014-09-13 DIAGNOSIS — Z89511 Acquired absence of right leg below knee: Secondary | ICD-10-CM

## 2014-09-13 DIAGNOSIS — R2689 Other abnormalities of gait and mobility: Secondary | ICD-10-CM

## 2014-09-13 DIAGNOSIS — Z89512 Acquired absence of left leg below knee: Secondary | ICD-10-CM

## 2014-09-14 NOTE — Therapy (Signed)
[facility] [address] [place], [place], [address] Phone: [phone]   Fax:  [phone]  Physical Therapy Treatment  Patient Details  Name: Paul [name] MRN: [id number] Date of Birth: [birth year] Referring Provider:  [name], MD  Encounter Date: 09/13/2014      PT End of Session - 09/13/14 1015    Visit Number 8   Number of Visits 17   Date for PT Re-Evaluation 09/23/14   PT Start Time 7425   PT Stop Time 1100   PT Time Calculation (min) 45 min   Equipment Utilized During Treatment Gait belt   Activity Tolerance Patient tolerated treatment well   Behavior During Therapy [facility] for tasks assessed/performed      Past Medical History  Diagnosis Date  . Open wound of both legs with complication     Pt has had progressive wounds of both LE's including gangrene of the toes and patchy necrosis of the calves.  He has been treated at [facility] by Dr Jerline Pain with hyperbaric O2 5d per week.  He is getting Na Thiosulfate with HD for suspected calciphylaxis. Pt says doctor's aren't sure if these ulcers were diabetic ulcers or calciphylaxis.  He underwent bilat BKA on 07/30/13.  He says that the woun  . ESRD on hemodialysis     Pt has ESRD due to DM.  He had a L upper arm AVF prior to starting HD which was ligated due to L arm steal syndrome.  He started HD in Jan 2014 and did home HD.  The family couldn't cannulate the R arm AVF successfully so by mid 2014 they decided to switch to PD which was done late summer 2014.  In Jan 2015 he was admitted with FTT felt to be due to underdialysis on PD and PD was abandoned and he   . Anemia     esrd  . Hypertension     off of meds due to orthostatic hypotension  . Closed left arm fracture 1967  . Heart murmur   . Corneal abrasion, left   . GERD (gastroesophageal reflux disease)   . History of blood transfusion   . Glomerulosclerosis, diabetic   . Depression   . A-fib   .  Calciphylaxis 05/2013    lower ext  . Diabetes mellitus     Type 1 iddm x 11 yrs  . Diabetic neuropathy   . Cancer     hx duodenal adenoCa 2002, resected    Past Surgical History  Procedure Laterality Date  . Whipple procedure  2002    duodenal ca, Dr. Harlow Asa  . Bone marrow biopsy  2012  . Renal biopsy, percutaneous  2012  . Tonsillectomy    . Portacath placement  2002  . Biliary diversion, external  2002  . Other surgical history      Cyst removed from back  . Av fistula placement  06/07/2011    Procedure: ARTERIOVENOUS (AV) FISTULA CREATION;  Surgeon: Angelia Mould, MD;  Location: [place];  Service: Vascular;  Laterality: Left;  . Ligation of arteriovenous  fistula  05/22/2012    Procedure: LIGATION OF ARTERIOVENOUS  FISTULA;  Surgeon: Mal Misty, MD;  Location: [place];  Service: Vascular;  Laterality: Left;  Left brachio-cephalic fistula  . Insertion of dialysis catheter  05/22/2012    Procedure: INSERTION OF DIALYSIS CATHETER;  Surgeon: Mal Misty, MD;  Location: [place];  Service: Vascular;  Laterality: N/A;  right internal  jugular vein  . Av fistula placement  06/18/2012    Procedure: ARTERIOVENOUS (AV) FISTULA CREATION;  Surgeon: Mal Misty, MD;  Location: [place];  Service: Vascular;  Laterality: Right;  [place]  . Colonoscopy      Hx: of  . Capd insertion N/A 01/14/2013    Procedure: LAPAROSCOPIC INSERTION CONTINUOUS AMBULATORY PERITONEAL DIALYSIS  (CAPD) CATHETER;  Surgeon: Adin Hector, MD;  Location: [place];  Service: General;  Laterality: N/A;  . Vascular surgery    . Removal of a dialysis catheter    . Capd removal N/A 07/09/2013    Procedure: CONTINUOUS AMBULATORY PERITONEAL DIALYSIS  (CAPD) CATHETER REMOVAL;  Surgeon: Adin Hector, MD;  Location: [place];  Service: General;  Laterality: N/A;  . Amputation Bilateral 07/30/2013    Procedure: AMPUTATION BELOW KNEE;  Surgeon: Newt Minion, MD;  Location: [place];  Service: Orthopedics;  Laterality: Bilateral;   Bilateral Below Knee Amputations  . Amputation Bilateral 09/03/2013    Procedure: AMPUTATION BELOW KNEE;  Surgeon: Newt Minion, MD;  Location: [place];  Service: Orthopedics;  Laterality: Bilateral;  Bilateral Below Knee Amputation Revision  . Lower extremity angiogram N/A 06/14/2013    Procedure: LOWER EXTREMITY ANGIOGRAM;  Surgeon: Angelia Mould, MD;  [facility] C[facility];  Service: Cardiovascular;  Laterality: N/A;  . Nephrectomy transplanted organ      There were no vitals filed for this visit.  Visit Diagnosis:  Abnormality of gait  Weakness generalized  Impaired functional mobility and activity tolerance  Balance problems  S/P bilateral BKA (below knee amputation)      Subjective Assessment - 09/13/14 1015    Subjective reports walked a lot at [facility] for follow up with transplant team. He also saw vascular MD and everyone feels right hand swelling was due to pulling catheter in chest and vascular study showed no issues with fistula   Currently in Pain? No/denies      Prosthetic Training: Patient ambulated with Supervision Device: Forearm crutches & prostheses  Distance: 500' on surfaces inside and outside on paved, grass, gravel Multi-modal cues for deviations: posture, step length, step width, Patient ambulated 65' with prostheses only with minA Patient ambulated 200' X 2 with 1 crutch with contact assist, 50' on grass with minA, ramp outside with contact assist and curb outside with minA.                               PT Education - 09/13/14 1015    Education provided Yes   Education Details increasing activity level, walking in home with wife without device and basic community with 1 crutch   Person(s) Educated Patient;Spouse   Methods Explanation   Comprehension Verbalized understanding          PT Short Term Goals - 09/13/14 1015    PT SHORT TERM GOAL #1   Title Pt will be able to: Ambulate 500 ft. with forearm crutches  and prostheses with supervision. (Target Date: 09/09/2014)   Baseline MET 09/13/14   Status Achieved   PT SHORT TERM GOAL #2   Title Patient will be able to ambulate 38' around furniture with 1 crutch & prostheses with supervision.  (Target Date: 09/09/2014)   Baseline MET 09/13/14   Status Achieved   PT SHORT TERM GOAL #3   Title Pt will be at to:  Perform standing activities for >15 minutes with prostheses with minimal c/o fatigue.  (  Target Date: 09/09/2014)   Baseline MET 09/13/14   Status Achieved   PT SHORT TERM GOAL #4   Title Pt will be able to reach 10" & within 5" of floor with support of 1 crutch with supervision. (Target Date: 09/09/2014)   Baseline MET 09/13/14   Time 1   Period Months   Status Achieved   PT SHORT TERM GOAL #5   Title Pt will be able to: negotiate ramp & curb with forearm crutches & prostheses with supervision.  (Target Date: 09/09/2014)   Baseline NOT MET 09/13/14 but progressing   Status Not Met           PT Long Term Goals - 07/27/14 1630    PT LONG TERM GOAL #1   Title Pt will be able to: Ambulate 850ft with LRAD & prostheses modified independent. (Target Date: 10/14/2014)   Time 12   Period Weeks   Status New   PT LONG TERM GOAL #2   Title Pt will be able to: ambulate 200' on uneven (grass) surfaces with LRAD & prostheses modified independent.  (Target Date: 10/14/2014)   Time 12   Period Weeks   Status New   PT LONG TERM GOAL #3   Title Pt will be able to: negotiate ramp, curb, stairs with LRAD & prostheses modified independent.  (Target Date: 10/14/2014)   Time 12   Period Weeks   Status New   PT LONG TERM GOAL #4   Title Pt will be able to: perform standing activities for > 20 minutes & prostheses with no pain or discomfort.  (Target Date: 10/14/2014)   Time 12   Period Weeks   Status New   PT LONG TERM GOAL #5   Title Berg Balance >/= 45/56 (Target Date: 10/14/2014)   Time 12   Period Weeks   Status New   PT LONG TERM GOAL #6   Title  Patient will ambulate in home simulated (100' and single step) with 1 crutch or less restrictive device modified independent. (Target Date: 10/14/2014)   Time 12   Period Weeks   Status New               Plan - 09/13/14 1015    Clinical Impression Statement Patient progressed to ambulating limited distance (household) without device except prostheses with assist. Patient improved negotiating curbs outside (6" compared to 7.5" inside).   Pt will benefit from skilled therapeutic intervention in order to improve on the following deficits Abnormal gait;Decreased activity tolerance;Decreased balance;Decreased endurance;Decreased mobility;Decreased strength   Rehab Potential Good   PT Frequency 2x / week   PT Duration 12 weeks   PT Treatment/Interventions ADLs/Self Care Home Management;Gait training;Stair training;Functional mobility training;Therapeutic activities;Therapeutic exercise;Balance training;Neuromuscular re-education;Patient/family education   Consulted and Agree with Plan of Care Patient;Family member/caregiver   Family Member Consulted wife        Problem List Patient Active Problem List   Diagnosis Date Noted  . Abnormal cardiovascular stress test 12/29/2013  . Awaiting organ transplant 12/09/2013  . Leg ulcer 11/17/2013  . Chronic kidney disease (CKD), stage V 11/17/2013  . Diabetes 11/17/2013  . Cancer of duodenum 11/17/2013  . Dyslipidemia 11/17/2013  . Chronic kidney disease requiring chronic dialysis 11/17/2013  . Hypercholesteremia 11/17/2013  . H/O malignant neoplasm 11/17/2013  . H/O: HTN (hypertension) 11/17/2013  . BP (high blood pressure) 11/17/2013  . Hypotension 11/17/2013  . Angiopathy, peripheral 11/17/2013  . AF (paroxysmal atrial fibrillation) 11/17/2013  . BKA stump complication 26/94/8546  .  S/P bilateral BKA (below knee amputation) 08/03/2013  . S/P BKA (below knee amputation) bilateral 07/30/2013  . Open wound of both legs with  complication 58/26/5871  . Acute blood loss anemia 07/12/2013  . Syncope 07/09/2013  . Intolerance to CAPD peritoneal dialysis s/p CAPD cath removal 07/09/2013 07/05/2013  . Critical lower limb ischemia 06/30/2013  . Extremity pain 06/30/2013  . Atherosclerosis of native arteries of the extremities with ulceration(440.23) 06/17/2013  . Gait disorder 05/31/2013  . Weakness 05/24/2013  . Depression 04/03/2013  . GERD (gastroesophageal reflux disease) 04/03/2013  . Atrial fibrillation 04/02/2013  . DM (diabetes mellitus) 12/28/2012  . Other complications due to renal dialysis device, implant, and graft 05/19/2012  . Diabetes mellitus, type 2 11/13/2011  . Anemia 07/02/2011  . ESRD on hemodialysis 05/29/2011    Jamey Reas PT, DPT 09/14/2014, 5:10 PM  [facility] [address] [place], [place], [address] Phone: [phone]   Fax:  [phone]

## 2014-09-15 ENCOUNTER — Encounter: Payer: Self-pay | Admitting: Physical Therapy

## 2014-09-15 ENCOUNTER — Ambulatory Visit: Payer: Managed Care, Other (non HMO) | Admitting: Physical Therapy

## 2014-09-15 DIAGNOSIS — R269 Unspecified abnormalities of gait and mobility: Secondary | ICD-10-CM | POA: Diagnosis not present

## 2014-09-15 DIAGNOSIS — R531 Weakness: Secondary | ICD-10-CM

## 2014-09-15 DIAGNOSIS — Z7409 Other reduced mobility: Secondary | ICD-10-CM

## 2014-09-15 NOTE — Therapy (Signed)
[facility] [address] [place], [place], [address] Phone: [phone]   Fax:  [phone]  Physical Therapy Treatment  Patient Details  Name: Paul [name] MRN: [id number] Date of Birth: 06-08-1955 Referring Provider:  [name], MD  Encounter Date: 09/15/2014      PT End of Session - 09/15/14 0805    Visit Number 9   Number of Visits 17   Date for PT Re-Evaluation 09/23/14   PT Start Time 0802   PT Stop Time 0850   PT Time Calculation (min) 48 min   Equipment Utilized During Treatment Gait belt   Activity Tolerance Patient tolerated treatment well   Behavior During Therapy [facility] for tasks assessed/performed      Past Medical History  Diagnosis Date  . Open wound of both legs with complication     Pt has had progressive wounds of both LE's including gangrene of the toes and patchy necrosis of the calves.  He has been treated at [facility] by Dr Jerline Pain with hyperbaric O2 5d per week.  He is getting Na Thiosulfate with HD for suspected calciphylaxis. Pt says doctor's aren't sure if these ulcers were diabetic ulcers or calciphylaxis.  He underwent bilat BKA on 07/30/13.  He says that the woun  . ESRD on hemodialysis     Pt has ESRD due to DM.  He had a L upper arm AVF prior to starting HD which was ligated due to L arm steal syndrome.  He started HD in Jan 2014 and did home HD.  The family couldn't cannulate the R arm AVF successfully so by mid 2014 they decided to switch to PD which was done late summer 2014.  In Jan 2015 he was admitted with FTT felt to be due to underdialysis on PD and PD was abandoned and he   . Anemia     esrd  . Hypertension     off of meds due to orthostatic hypotension  . Closed left arm fracture 1967  . Heart murmur   . Corneal abrasion, left   . GERD (gastroesophageal reflux disease)   . History of blood transfusion   . Glomerulosclerosis, diabetic   . Depression   . A-fib   .  Calciphylaxis 05/2013    lower ext  . Diabetes mellitus     Type 1 iddm x 11 yrs  . Diabetic neuropathy   . Cancer     hx duodenal adenoCa 2002, resected    Past Surgical History  Procedure Laterality Date  . Whipple procedure  2002    duodenal ca, Dr. Harlow Asa  . Bone marrow biopsy  2012  . Renal biopsy, percutaneous  2012  . Tonsillectomy    . Portacath placement  2002  . Biliary diversion, external  2002  . Other surgical history      Cyst removed from back  . Av fistula placement  06/07/2011    Procedure: ARTERIOVENOUS (AV) FISTULA CREATION;  Surgeon: Angelia Mould, MD;  Location: [place];  Service: Vascular;  Laterality: Left;  . Ligation of arteriovenous  fistula  05/22/2012    Procedure: LIGATION OF ARTERIOVENOUS  FISTULA;  Surgeon: Mal Misty, MD;  Location: [place];  Service: Vascular;  Laterality: Left;  Left brachio-cephalic fistula  . Insertion of dialysis catheter  05/22/2012    Procedure: INSERTION OF DIALYSIS CATHETER;  Surgeon: Mal Misty, MD;  Location: [place];  Service: Vascular;  Laterality: N/A;  right internal  jugular vein  . Av fistula placement  06/18/2012    Procedure: ARTERIOVENOUS (AV) FISTULA CREATION;  Surgeon: Mal Misty, MD;  Location: [place];  Service: Vascular;  Laterality: Right;  [place]  . Colonoscopy      Hx: of  . Capd insertion N/A 01/14/2013    Procedure: LAPAROSCOPIC INSERTION CONTINUOUS AMBULATORY PERITONEAL DIALYSIS  (CAPD) CATHETER;  Surgeon: Adin Hector, MD;  Location: [place];  Service: General;  Laterality: N/A;  . Vascular surgery    . Removal of a dialysis catheter    . Capd removal N/A 07/09/2013    Procedure: CONTINUOUS AMBULATORY PERITONEAL DIALYSIS  (CAPD) CATHETER REMOVAL;  Surgeon: Adin Hector, MD;  Location: [place];  Service: General;  Laterality: N/A;  . Amputation Bilateral 07/30/2013    Procedure: AMPUTATION BELOW KNEE;  Surgeon: Newt Minion, MD;  Location: [place];  Service: Orthopedics;  Laterality: Bilateral;   Bilateral Below Knee Amputations  . Amputation Bilateral 09/03/2013    Procedure: AMPUTATION BELOW KNEE;  Surgeon: Newt Minion, MD;  Location: [place];  Service: Orthopedics;  Laterality: Bilateral;  Bilateral Below Knee Amputation Revision  . Lower extremity angiogram N/A 06/14/2013    Procedure: LOWER EXTREMITY ANGIOGRAM;  Surgeon: Angelia Mould, MD;  Location: [facility] CATH LAB;  Service: Cardiovascular;  Laterality: N/A;  . Nephrectomy transplanted organ      There were no vitals filed for this visit.  Visit Diagnosis:  Abnormality of gait  Weakness generalized  Impaired functional mobility and activity tolerance      Subjective Assessment - 09/15/14 0804    Subjective Hoping to return to work on May 10th if everything goes as planned. No falls.          [facility] Adult PT Treatment/Exercise - 09/15/14 0809    Transfers   Sit to Stand 5: Supervision;With upper extremity assist;With armrests;From chair/3-in-1   Stand to Sit 5: Supervision;With upper extremity assist;With armrests;To chair/3-in-1   Ambulation/Gait   Ambulation/Gait Yes   Ambulation/Gait Assistance 5: Supervision   Ambulation/Gait Assistance Details cues on posture and decreased base of support with gait with single crutch   Ambulation Distance (Feet) 550 Feet   Assistive device Prostheses;Lofstrands  single crutch   Ambulation Surface Level;Indoor   Ramp 5: Supervision   Ramp Details (indicate cue type and reason) with bil crutches/prostheses, cues on posture only   Curb 4: Min assist  bil crutches/prostheses   Curb Details (indicate cue type and reason) indoor 6 inch curb x 3 reps; 1st rep pt had one crutch up and one crutch down while stepping up, needed min assist with loss of balance once up needed step strategy to recover with min assist. on next 2 reps pt had both crutches on floor while stepping up with less assist needed and increased steadiness on ascending and once up curb.                                Knee/Hip Exercises: Machines for Strengthening   Cybex Leg Press both legs 90# 5 sec hold 2 sets x 10 reps; single legs 50# 5 sec hold 2 sets x 10 reps, attempted 60# on second set, pt unable to push increased weight with good form.    Knee/Hip Exercises: Standing   Forward Step Up Both;1 set;10 reps   Forward Step Up Limitations with 6 inch wooden step in parallel bars: alternating stepping up onto box  and lifting other leg up into a high march and then back down. marching leg does not touch box. UE support on parallel bars needed.                               Prosthetics   Prosthetic Care Comments  Pt reports getting new liners on Monday when see's prosthetist at 9:30.                         Current prosthetic wear tolerance (days/week)  7 days /wk   Current prosthetic wear tolerance (#hours/day)  reports wear when out of bed but only out of bed ~4-8 hours/day   Residual limb condition  pt reports intact   Education Provided Residual limb care;Skin check   Person(s) Educated Patient;Spouse   Education Method Explanation   Education Method Verbalized understanding   Donning Prosthesis Modified independent (device/increased time)   Doffing Prosthesis Modified independent (device/increased time)           PT Short Term Goals - 09/15/14 0806    PT SHORT TERM GOAL #1   Title Pt will be able to: Ambulate 500 ft. with forearm crutches and prostheses with supervision. (Target Date: 09/09/2014)   Baseline MET 09/13/14   Status Achieved   PT SHORT TERM GOAL #2   Title Patient will be able to ambulate 50' around furniture with 1 crutch & prostheses with supervision.  (Target Date: 09/09/2014)   Baseline MET 09/13/14   Status Achieved   PT SHORT TERM GOAL #3   Title Pt will be at to:  Perform standing activities for >15 minutes with prostheses with minimal c/o fatigue.  (Target Date: 09/09/2014)   Baseline MET 09/13/14   Status Achieved   PT SHORT TERM GOAL #4   Title Pt will be able to  reach 10" & within 5" of floor with support of 1 crutch with supervision. (Target Date: 09/09/2014)   Baseline MET 09/13/14   Time 1   Period Months   Status Achieved   PT SHORT TERM GOAL #5   Title Pt will be able to: negotiate ramp & curb with forearm crutches & prostheses with supervision.  (Target Date: 09/09/2014)   Baseline NOT MET 09/13/14 but progressing   Status Not Met           PT Long Term Goals - 07/27/14 1630    PT LONG TERM GOAL #1   Title Pt will be able to: Ambulate 869ft with LRAD & prostheses modified independent. (Target Date: 10/14/2014)   Time 12   Period Weeks   Status New   PT LONG TERM GOAL #2   Title Pt will be able to: ambulate 200' on uneven (grass) surfaces with LRAD & prostheses modified independent.  (Target Date: 10/14/2014)   Time 12   Period Weeks   Status New   PT LONG TERM GOAL #3   Title Pt will be able to: negotiate ramp, curb, stairs with LRAD & prostheses modified independent.  (Target Date: 10/14/2014)   Time 12   Period Weeks   Status New   PT LONG TERM GOAL #4   Title Pt will be able to: perform standing activities for > 20 minutes & prostheses with no pain or discomfort.  (Target Date: 10/14/2014)   Time 12   Period Weeks   Status New   PT LONG TERM GOAL #5   Title  Berg Balance >/= 45/56 (Target Date: 10/14/2014)   Time 12   Period Weeks   Status New   PT LONG TERM GOAL #6   Title Patient will ambulate in home simulated (100' and single step) with 1 crutch or less restrictive device modified independent. (Target Date: 10/14/2014)   Time 12   Period Weeks   Status New           Plan - 09/15/14 0807    Clinical Impression Statement Pt making steady progress toward goals. Improved curb negotiation stability with practice today.   Pt will benefit from skilled therapeutic intervention in order to improve on the following deficits Abnormal gait;Decreased activity tolerance;Decreased balance;Decreased endurance;Decreased  mobility;Decreased strength   Rehab Potential Good   PT Frequency 2x / week   PT Duration 12 weeks   PT Treatment/Interventions ADLs/Self Care Home Management;Gait training;Stair training;Functional mobility training;Therapeutic activities;Therapeutic exercise;Balance training;Neuromuscular re-education;Patient/family education   PT Next Visit Plan Continue toward LTGs: gait wtih single crutch/no AD, curb negotiation, balance and strengthening   Consulted and Agree with Plan of Care Patient;Family member/caregiver   Family Member Consulted wife        Problem List Patient Active Problem List   Diagnosis Date Noted  . Abnormal cardiovascular stress test 12/29/2013  . Awaiting organ transplant 12/09/2013  . Leg ulcer 11/17/2013  . Chronic kidney disease (CKD), stage V 11/17/2013  . Diabetes 11/17/2013  . Cancer of duodenum 11/17/2013  . Dyslipidemia 11/17/2013  . Chronic kidney disease requiring chronic dialysis 11/17/2013  . Hypercholesteremia 11/17/2013  . H/O malignant neoplasm 11/17/2013  . H/O: HTN (hypertension) 11/17/2013  . BP (high blood pressure) 11/17/2013  . Hypotension 11/17/2013  . Angiopathy, peripheral 11/17/2013  . AF (paroxysmal atrial fibrillation) 11/17/2013  . BKA stump complication 90/30/0923  . S/P bilateral BKA (below knee amputation) 08/03/2013  . S/P BKA (below knee amputation) bilateral 07/30/2013  . Open wound of both legs with complication 30/11/6224  . Acute blood loss anemia 07/12/2013  . Syncope 07/09/2013  . Intolerance to CAPD peritoneal dialysis s/p CAPD cath removal 07/09/2013 07/05/2013  . Critical lower limb ischemia 06/30/2013  . Extremity pain 06/30/2013  . Atherosclerosis of native arteries of the extremities with ulceration(440.23) 06/17/2013  . Gait disorder 05/31/2013  . Weakness 05/24/2013  . Depression 04/03/2013  . GERD (gastroesophageal reflux disease) 04/03/2013  . Atrial fibrillation 04/02/2013  . DM (diabetes mellitus)  12/28/2012  . Other complications due to renal dialysis device, implant, and graft 05/19/2012  . Diabetes mellitus, type 2 11/13/2011  . Anemia 07/02/2011  . ESRD on hemodialysis 05/29/2011    Willow Ora 09/15/2014, 9:23 AM  Willow Ora, PTA, [facility] [address], [place], [place] [address] [phone] 09/15/2014, 9:23 AM

## 2014-09-19 ENCOUNTER — Encounter: Payer: Self-pay | Admitting: Physical Therapy

## 2014-09-19 ENCOUNTER — Ambulatory Visit: Payer: Managed Care, Other (non HMO) | Attending: Family Medicine | Admitting: Physical Therapy

## 2014-09-19 DIAGNOSIS — N186 End stage renal disease: Secondary | ICD-10-CM | POA: Insufficient documentation

## 2014-09-19 DIAGNOSIS — E119 Type 2 diabetes mellitus without complications: Secondary | ICD-10-CM | POA: Insufficient documentation

## 2014-09-19 DIAGNOSIS — R5381 Other malaise: Secondary | ICD-10-CM | POA: Diagnosis not present

## 2014-09-19 DIAGNOSIS — Z7409 Other reduced mobility: Secondary | ICD-10-CM

## 2014-09-19 DIAGNOSIS — Z89512 Acquired absence of left leg below knee: Secondary | ICD-10-CM | POA: Diagnosis not present

## 2014-09-19 DIAGNOSIS — I120 Hypertensive chronic kidney disease with stage 5 chronic kidney disease or end stage renal disease: Secondary | ICD-10-CM | POA: Insufficient documentation

## 2014-09-19 DIAGNOSIS — R269 Unspecified abnormalities of gait and mobility: Secondary | ICD-10-CM

## 2014-09-19 DIAGNOSIS — I4891 Unspecified atrial fibrillation: Secondary | ICD-10-CM | POA: Diagnosis not present

## 2014-09-19 DIAGNOSIS — R2689 Other abnormalities of gait and mobility: Secondary | ICD-10-CM

## 2014-09-19 DIAGNOSIS — Z89511 Acquired absence of right leg below knee: Secondary | ICD-10-CM

## 2014-09-19 DIAGNOSIS — R531 Weakness: Secondary | ICD-10-CM

## 2014-09-19 NOTE — Therapy (Signed)
[facility] [address] [place], [place], [address] Phone: [phone]   Fax:  [phone]  Physical Therapy Treatment  Patient Details  Name: Paul [name] MRN: [id number] Date of Birth: Aug 17, 1955 Referring Provider:  [name], MD  Encounter Date: 09/19/2014      PT End of Session - 09/19/14 0800    Visit Number 10   Number of Visits 17   Date for PT Re-Evaluation 09/23/14   PT Start Time 0800   PT Stop Time 0845   PT Time Calculation (min) 45 min   Equipment Utilized During Treatment Gait belt   Activity Tolerance Patient tolerated treatment well   Behavior During Therapy [facility] for tasks assessed/performed      Past Medical History  Diagnosis Date  . Open wound of both legs with complication     Pt has had progressive wounds of both LE's including gangrene of the toes and patchy necrosis of the calves.  He has been treated at [facility] by Dr Jerline Pain with hyperbaric O2 5d per week.  He is getting Na Thiosulfate with HD for suspected calciphylaxis. Pt says doctor's aren't sure if these ulcers were diabetic ulcers or calciphylaxis.  He underwent bilat BKA on 07/30/13.  He says that the woun  . ESRD on hemodialysis     Pt has ESRD due to DM.  He had a L upper arm AVF prior to starting HD which was ligated due to L arm steal syndrome.  He started HD in Jan 2014 and did home HD.  The family couldn't cannulate the R arm AVF successfully so by mid 2014 they decided to switch to PD which was done late summer 2014.  In Jan 2015 he was admitted with FTT felt to be due to underdialysis on PD and PD was abandoned and he   . Anemia     esrd  . Hypertension     off of meds due to orthostatic hypotension  . Closed left arm fracture 1967  . Heart murmur   . Corneal abrasion, left   . GERD (gastroesophageal reflux disease)   . History of blood transfusion   . Glomerulosclerosis, diabetic   . Depression   . A-fib   .  Calciphylaxis 05/2013    lower ext  . Diabetes mellitus     Type 1 iddm x 11 yrs  . Diabetic neuropathy   . Cancer     hx duodenal adenoCa 2002, resected    Past Surgical History  Procedure Laterality Date  . Whipple procedure  2002    duodenal ca, Dr. Harlow Asa  . Bone marrow biopsy  2012  . Renal biopsy, percutaneous  2012  . Tonsillectomy    . Portacath placement  2002  . Biliary diversion, external  2002  . Other surgical history      Cyst removed from back  . Av fistula placement  06/07/2011    Procedure: ARTERIOVENOUS (AV) FISTULA CREATION;  Surgeon: Angelia Mould, MD;  Location: [place];  Service: Vascular;  Laterality: Left;  . Ligation of arteriovenous  fistula  05/22/2012    Procedure: LIGATION OF ARTERIOVENOUS  FISTULA;  Surgeon: Mal Misty, MD;  Location: [place];  Service: Vascular;  Laterality: Left;  Left brachio-cephalic fistula  . Insertion of dialysis catheter  05/22/2012    Procedure: INSERTION OF DIALYSIS CATHETER;  Surgeon: Mal Misty, MD;  Location: [place];  Service: Vascular;  Laterality: N/A;  right internal  jugular vein  . Av fistula placement  06/18/2012    Procedure: ARTERIOVENOUS (AV) FISTULA CREATION;  Surgeon: Mal Misty, MD;  Location: [facility] [place];  Service: Vascular;  Laterality: Right;  [place]  . Colonoscopy      Hx: of  . Capd insertion N/A 01/14/2013    Procedure: LAPAROSCOPIC INSERTION CONTINUOUS AMBULATORY PERITONEAL DIALYSIS  (CAPD) CATHETER;  Surgeon: Adin Hector, MD;  Location: [place];  Service: General;  Laterality: N/A;  . Vascular surgery    . Removal of a dialysis catheter    . Capd removal N/A 07/09/2013    Procedure: CONTINUOUS AMBULATORY PERITONEAL DIALYSIS  (CAPD) CATHETER REMOVAL;  Surgeon: Adin Hector, MD;  Location: [place];  Service: General;  Laterality: N/A;  . Amputation Bilateral 07/30/2013    Procedure: AMPUTATION BELOW KNEE;  Surgeon: Newt Minion, MD;  Location: [place];  Service: Orthopedics;  Laterality: Bilateral;   Bilateral Below Knee Amputations  . Amputation Bilateral 09/03/2013    Procedure: AMPUTATION BELOW KNEE;  Surgeon: Newt Minion, MD;  Location: [place];  Service: Orthopedics;  Laterality: Bilateral;  Bilateral Below Knee Amputation Revision  . Lower extremity angiogram N/A 06/14/2013    Procedure: LOWER EXTREMITY ANGIOGRAM;  Surgeon: Angelia Mould, MD;  Location: [facility] C[facility];  Service: Cardiovascular;  Laterality: N/A;  . Nephrectomy transplanted organ      There were no vitals filed for this visit.  Visit Diagnosis:  Abnormality of gait  Weakness generalized  Impaired functional mobility and activity tolerance  Balance problems  S/P bilateral BKA (below knee amputation)      Subjective Assessment - 09/19/14 0814    Subjective Going back to work 09/27/14 with plans to work 30hrs /wk. Went to [employer] and did leg press, hip abd/add and recumbent stepper.   Currently in Pain? No/denies      Prosthetic Training: Sit to stand with  Contact guard assist  from 24" stool without use of UEs, from rolling office chair with armrests and chairs with armrests without touching object to stabilize Stand to sit with  Supervision  to 24" stool without use of UEs, from rolling office chair with armrests and chairs with armrests without touching object to stabilize Patient ambulated with Contact guard assist Device: No device & prostheses  Distance: 100' X 3 without device and 150' X 1 with 5# wt back pack with single crutch on surfaces: indoor, carpet   Multi-modal cues for deviations: posture, balance reactions and step width Patient negotiated ramp with Mod. Assist Device: single Forearm crutches & prostheses with cues on posture & wt shift. Patient negotiated curb (2 aerobic steps) with Min. Assist Device: single Forearm crutches & prostheses with cues on technique. Patient negotiated stairs with Supervision Device: Two handrails & prostheses reciprocal patternascending & step-to  descending.  Balance activities without device with minA: sidestepping and backwards gait, pushing with resistance anteriorly. Lifted 5# crate from 20" table & floor with 90 degree turn with minA (first time PT assisted to chair 2x due to "fall" with balance loss; 2nd time minA to prevent "fall")                             PT Education - 09/19/14 0852    Education provided Yes   Education Details lifting & carrying objects   Person(s) Educated Patient;Spouse   Methods Explanation;Demonstration   Comprehension Verbalized understanding;Returned demonstration;Verbal cues required;Tactile cues required;Need further instruction  PT Short Term Goals - 09/15/14 0806    PT SHORT TERM GOAL #1   Title Pt will be able to: Ambulate 500 ft. with forearm crutches and prostheses with supervision. (Target Date: 09/09/2014)   Baseline MET 09/13/14   Status Achieved   PT SHORT TERM GOAL #2   Title Patient will be able to ambulate 90' around furniture with 1 crutch & prostheses with supervision.  (Target Date: 09/09/2014)   Baseline MET 09/13/14   Status Achieved   PT SHORT TERM GOAL #3   Title Pt will be at to:  Perform standing activities for >15 minutes with prostheses with minimal c/o fatigue.  (Target Date: 09/09/2014)   Baseline MET 09/13/14   Status Achieved   PT SHORT TERM GOAL #4   Title Pt will be able to reach 10" & within 5" of floor with support of 1 crutch with supervision. (Target Date: 09/09/2014)   Baseline MET 09/13/14   Time 1   Period Months   Status Achieved   PT SHORT TERM GOAL #5   Title Pt will be able to: negotiate ramp & curb with forearm crutches & prostheses with supervision.  (Target Date: 09/09/2014)   Baseline NOT MET 09/13/14 but progressing   Status Not Met           PT Long Term Goals - 07/27/14 1630    PT LONG TERM GOAL #1   Title Pt will be able to: Ambulate 854ft with LRAD & prostheses modified independent. (Target Date:  10/14/2014)   Time 12   Period Weeks   Status New   PT LONG TERM GOAL #2   Title Pt will be able to: ambulate 200' on uneven (grass) surfaces with LRAD & prostheses modified independent.  (Target Date: 10/14/2014)   Time 12   Period Weeks   Status New   PT LONG TERM GOAL #3   Title Pt will be able to: negotiate ramp, curb, stairs with LRAD & prostheses modified independent.  (Target Date: 10/14/2014)   Time 12   Period Weeks   Status New   PT LONG TERM GOAL #4   Title Pt will be able to: perform standing activities for > 20 minutes & prostheses with no pain or discomfort.  (Target Date: 10/14/2014)   Time 12   Period Weeks   Status New   PT LONG TERM GOAL #5   Title Berg Balance >/= 45/56 (Target Date: 10/14/2014)   Time 12   Period Weeks   Status New   PT LONG TERM GOAL #6   Title Patient will ambulate in home simulated (100' and single step) with 1 crutch or less restrictive device modified independent. (Target Date: 10/14/2014)   Time 12   Period Weeks   Status New               Plan - 09/19/14 0800    Clinical Impression Statement Patient was able to improve balance with lifting & carrying with instruction & practice.   Pt will benefit from skilled therapeutic intervention in order to improve on the following deficits Abnormal gait;Decreased activity tolerance;Decreased balance;Decreased endurance;Decreased mobility;Decreased strength   Rehab Potential Good   PT Frequency 2x / week   PT Duration 12 weeks   PT Treatment/Interventions ADLs/Self Care Home Management;Gait training;Stair training;Functional mobility training;Therapeutic activities;Therapeutic exercise;Balance training;Neuromuscular re-education;Patient/family education   PT Next Visit Plan Continue toward LTGs: gait wtih single crutch/no AD, curb negotiation, balance and strengthening   Consulted and Agree with Plan  of Care Patient;Family member/caregiver   Family Member Consulted wife        Problem  List Patient Active Problem List   Diagnosis Date Noted  . Abnormal cardiovascular stress test 12/29/2013  . Awaiting organ transplant 12/09/2013  . Leg ulcer 11/17/2013  . Chronic kidney disease (CKD), stage V 11/17/2013  . Diabetes 11/17/2013  . Cancer of duodenum 11/17/2013  . Dyslipidemia 11/17/2013  . Chronic kidney disease requiring chronic dialysis 11/17/2013  . Hypercholesteremia 11/17/2013  . H/O malignant neoplasm 11/17/2013  . H/O: HTN (hypertension) 11/17/2013  . BP (high blood pressure) 11/17/2013  . Hypotension 11/17/2013  . Angiopathy, peripheral 11/17/2013  . AF (paroxysmal atrial fibrillation) 11/17/2013  . BKA stump complication 76/16/0737  . S/P bilateral BKA (below knee amputation) 08/03/2013  . S/P BKA (below knee amputation) bilateral 07/30/2013  . Open wound of both legs with complication 10/62/6948  . Acute blood loss anemia 07/12/2013  . Syncope 07/09/2013  . Intolerance to CAPD peritoneal dialysis s/p CAPD cath removal 07/09/2013 07/05/2013  . Critical lower limb ischemia 06/30/2013  . Extremity pain 06/30/2013  . Atherosclerosis of native arteries of the extremities with ulceration(440.23) 06/17/2013  . Gait disorder 05/31/2013  . Weakness 05/24/2013  . Depression 04/03/2013  . GERD (gastroesophageal reflux disease) 04/03/2013  . Atrial fibrillation 04/02/2013  . DM (diabetes mellitus) 12/28/2012  . Other complications due to renal dialysis device, implant, and graft 05/19/2012  . Diabetes mellitus, type 2 11/13/2011  . Anemia 07/02/2011  . ESRD on hemodialysis 05/29/2011    Jamey Reas PT, DPT 09/19/2014, 9:07 AM  [facility] [address] [place], [place], [address] Phone: [phone]   Fax:  [phone]

## 2014-09-22 ENCOUNTER — Encounter: Payer: Self-pay | Admitting: Physical Therapy

## 2014-09-22 ENCOUNTER — Ambulatory Visit: Payer: Managed Care, Other (non HMO) | Admitting: Physical Therapy

## 2014-09-22 DIAGNOSIS — R269 Unspecified abnormalities of gait and mobility: Secondary | ICD-10-CM | POA: Diagnosis not present

## 2014-09-22 DIAGNOSIS — R2689 Other abnormalities of gait and mobility: Secondary | ICD-10-CM

## 2014-09-22 DIAGNOSIS — Z89512 Acquired absence of left leg below knee: Secondary | ICD-10-CM

## 2014-09-22 DIAGNOSIS — Z7409 Other reduced mobility: Secondary | ICD-10-CM

## 2014-09-22 DIAGNOSIS — Z89511 Acquired absence of right leg below knee: Secondary | ICD-10-CM

## 2014-09-22 DIAGNOSIS — R531 Weakness: Secondary | ICD-10-CM

## 2014-09-23 NOTE — Therapy (Signed)
[facility] [address] [place], [place], [address] Phone: [phone]   Fax:  [phone]  Physical Therapy Treatment  Patient Details  Name: Paul [name] MRN: [id number] Date of Birth: 1956/03/07 Referring Provider:  [name], MD  Encounter Date: 09/22/2014      PT End of Session - 09/22/14 0800    Visit Number 11   Number of Visits 17   Date for PT Re-Evaluation 09/23/14   PT Start Time 0803   PT Stop Time 0845   PT Time Calculation (min) 42 min   Equipment Utilized During Treatment Gait belt   Activity Tolerance Patient tolerated treatment well   Behavior During Therapy [facility] for tasks assessed/performed      Past Medical History  Diagnosis Date  . Open wound of both legs with complication     Pt has had progressive wounds of both LE's including gangrene of the toes and patchy necrosis of the calves.  He has been treated at [facility] by Dr Jerline Pain with hyperbaric O2 5d per week.  He is getting Na Thiosulfate with HD for suspected calciphylaxis. Pt says doctor's aren't sure if these ulcers were diabetic ulcers or calciphylaxis.  He underwent bilat BKA on 07/30/13.  He says that the woun  . ESRD on hemodialysis     Pt has ESRD due to DM.  He had a L upper arm AVF prior to starting HD which was ligated due to L arm steal syndrome.  He started HD in Jan 2014 and did home HD.  The family couldn't cannulate the R arm AVF successfully so by mid 2014 they decided to switch to PD which was done late summer 2014.  In Jan 2015 he was admitted with FTT felt to be due to underdialysis on PD and PD was abandoned and he   . Anemia     esrd  . Hypertension     off of meds due to orthostatic hypotension  . Closed left arm fracture 1967  . Heart murmur   . Corneal abrasion, left   . GERD (gastroesophageal reflux disease)   . History of blood transfusion   . Glomerulosclerosis, diabetic   . Depression   . A-fib   .  Calciphylaxis 05/2013    lower ext  . Diabetes mellitus     Type 1 iddm x 11 yrs  . Diabetic neuropathy   . Cancer     hx duodenal adenoCa 2002, resected    Past Surgical History  Procedure Laterality Date  . Whipple procedure  2002    duodenal ca, Dr. Harlow Asa  . Bone marrow biopsy  2012  . Renal biopsy, percutaneous  2012  . Tonsillectomy    . Portacath placement  2002  . Biliary diversion, external  2002  . Other surgical history      Cyst removed from back  . Av fistula placement  06/07/2011    Procedure: ARTERIOVENOUS (AV) FISTULA CREATION;  Surgeon: Angelia Mould, MD;  Location: [place];  Service: Vascular;  Laterality: Left;  . Ligation of arteriovenous  fistula  05/22/2012    Procedure: LIGATION OF ARTERIOVENOUS  FISTULA;  Surgeon: Mal Misty, MD;  Location: [place];  Service: Vascular;  Laterality: Left;  Left brachio-cephalic fistula  . Insertion of dialysis catheter  05/22/2012    Procedure: INSERTION OF DIALYSIS CATHETER;  Surgeon: Mal Misty, MD;  Location: [place];  Service: Vascular;  Laterality: N/A;  right internal  jugular vein  . Av fistula placement  06/18/2012    Procedure: ARTERIOVENOUS (AV) FISTULA CREATION;  Surgeon: Mal Misty, MD;  Location: [place];  Service: Vascular;  Laterality: Right;  [place]  . Colonoscopy      Hx: of  . Capd insertion N/A 01/14/2013    Procedure: LAPAROSCOPIC INSERTION CONTINUOUS AMBULATORY PERITONEAL DIALYSIS  (CAPD) CATHETER;  Surgeon: Adin Hector, MD;  Location: [place];  Service: General;  Laterality: N/A;  . Vascular surgery    . Removal of a dialysis catheter    . Capd removal N/A 07/09/2013    Procedure: CONTINUOUS AMBULATORY PERITONEAL DIALYSIS  (CAPD) CATHETER REMOVAL;  Surgeon: Adin Hector, MD;  Location: [place];  Service: General;  Laterality: N/A;  . Amputation Bilateral 07/30/2013    Procedure: AMPUTATION BELOW KNEE;  Surgeon: Newt Minion, MD;  Location: [place];  Service: Orthopedics;  Laterality: Bilateral;   Bilateral Below Knee Amputations  . Amputation Bilateral 09/03/2013    Procedure: AMPUTATION BELOW KNEE;  Surgeon: Newt Minion, MD;  Location: [place];  Service: Orthopedics;  Laterality: Bilateral;  Bilateral Below Knee Amputation Revision  . Lower extremity angiogram N/A 06/14/2013    Procedure: LOWER EXTREMITY ANGIOGRAM;  Surgeon: Angelia Mould, MD;  Location: [facility] CATH LAB;  Service: Cardiovascular;  Laterality: N/A;  . Nephrectomy transplanted organ      There were no vitals filed for this visit.  Visit Diagnosis:  Abnormality of gait  Weakness generalized  Impaired functional mobility and activity tolerance  Balance problems  S/P bilateral BKA (below knee amputation)      Subjective Assessment - 09/22/14 0800    Subjective (p) feels like he is ready to return to work.   Currently in Pain? (p) No/denies      Prosthetic Training: Sit to stand with  Supervision  from chair with armrests without object to stabilize Stand to sit with  Supervision  to chair with armrests without object to stabilize Patient ambulated with Supervision Device: No device & prostheses  Distance:  on Indoor  Level surface.  Multi-modal cues for deviations: posture, balance reactions, step width Patient negotiated ramp with Min. Assist Device: single Forearm crutches & prostheses  Patient negotiated curb with Min. Assist Device: single Forearm crutches & prostheses   Patient ambulated on treadmill 0.5mph 2 min uphill & 2 min downhill 7 degree with BUE support (cues to minimize use) with cues to maintain upright posture & perceive wt shift over prostheses. Standing on ramp uphill & downhill with 2 forearm crutches stabilization exercise of moving one crutch & bil. crutches forward & back with feet in bilateral & step stance with CGA.                              PT Education - 09/22/14 0800    Education provided Yes   Education Details performing crutch movements while  standing uphill & downhill on ramp for balance   Person(s) Educated Patient;Spouse   Methods Explanation;Demonstration;Tactile cues;Verbal cues   Comprehension Verbalized understanding;Returned demonstration;Verbal cues required;Tactile cues required;Need further instruction          PT Short Term Goals - 09/15/14 0806    PT SHORT TERM GOAL #1   Title Pt will be able to: Ambulate 500 ft. with forearm crutches and prostheses with supervision. (Target Date: 09/09/2014)   Baseline MET 09/13/14   Status Achieved   PT SHORT TERM GOAL #2  Title Patient will be able to ambulate 19' around furniture with 1 crutch & prostheses with supervision.  (Target Date: 09/09/2014)   Baseline MET 09/13/14   Status Achieved   PT SHORT TERM GOAL #3   Title Pt will be at to:  Perform standing activities for >15 minutes with prostheses with minimal c/o fatigue.  (Target Date: 09/09/2014)   Baseline MET 09/13/14   Status Achieved   PT SHORT TERM GOAL #4   Title Pt will be able to reach 10" & within 5" of floor with support of 1 crutch with supervision. (Target Date: 09/09/2014)   Baseline MET 09/13/14   Time 1   Period Months   Status Achieved   PT SHORT TERM GOAL #5   Title Pt will be able to: negotiate ramp & curb with forearm crutches & prostheses with supervision.  (Target Date: 09/09/2014)   Baseline NOT MET 09/13/14 but progressing   Status Not Met           PT Long Term Goals - 07/27/14 1630    PT LONG TERM GOAL #1   Title Pt will be able to: Ambulate 819ft with LRAD & prostheses modified independent. (Target Date: 10/14/2014)   Time 12   Period Weeks   Status New   PT LONG TERM GOAL #2   Title Pt will be able to: ambulate 200' on uneven (grass) surfaces with LRAD & prostheses modified independent.  (Target Date: 10/14/2014)   Time 12   Period Weeks   Status New   PT LONG TERM GOAL #3   Title Pt will be able to: negotiate ramp, curb, stairs with LRAD & prostheses modified independent.  (Target  Date: 10/14/2014)   Time 12   Period Weeks   Status New   PT LONG TERM GOAL #4   Title Pt will be able to: perform standing activities for > 20 minutes & prostheses with no pain or discomfort.  (Target Date: 10/14/2014)   Time 12   Period Weeks   Status New   PT LONG TERM GOAL #5   Title Berg Balance >/= 45/56 (Target Date: 10/14/2014)   Time 12   Period Weeks   Status New   PT LONG TERM GOAL #6   Title Patient will ambulate in home simulated (100' and single step) with 1 crutch or less restrictive device modified independent. (Target Date: 10/14/2014)   Time 12   Period Weeks   Status New               Plan - 09/22/14 0800    Clinical Impression Statement Patient improved balance / gait on inclines with instruction & repetition.   Pt will benefit from skilled therapeutic intervention in order to improve on the following deficits Abnormal gait;Decreased activity tolerance;Decreased balance;Decreased endurance;Decreased mobility;Decreased strength   Rehab Potential Good   PT Frequency 2x / week   PT Duration 12 weeks   PT Treatment/Interventions ADLs/Self Care Home Management;Gait training;Stair training;Functional mobility training;Therapeutic activities;Therapeutic exercise;Balance training;Neuromuscular re-education;Patient/family education   PT Next Visit Plan Continue toward LTGs: gait wtih single crutch/no AD, curb negotiation, balance and strengthening   Consulted and Agree with Plan of Care Patient;Family member/caregiver   Family Member Consulted wife        Problem List Patient Active Problem List   Diagnosis Date Noted  . Abnormal cardiovascular stress test 12/29/2013  . Awaiting organ transplant 12/09/2013  . Leg ulcer 11/17/2013  . Chronic kidney disease (CKD), stage V 11/17/2013  .  Diabetes 11/17/2013  . Cancer of duodenum 11/17/2013  . Dyslipidemia 11/17/2013  . Chronic kidney disease requiring chronic dialysis 11/17/2013  . Hypercholesteremia  11/17/2013  . H/O malignant neoplasm 11/17/2013  . H/O: HTN (hypertension) 11/17/2013  . BP (high blood pressure) 11/17/2013  . Hypotension 11/17/2013  . Angiopathy, peripheral 11/17/2013  . AF (paroxysmal atrial fibrillation) 11/17/2013  . BKA stump complication 38/33/3832  . S/P bilateral BKA (below knee amputation) 08/03/2013  . S/P BKA (below knee amputation) bilateral 07/30/2013  . Open wound of both legs with complication 91/91/6606  . Acute blood loss anemia 07/12/2013  . Syncope 07/09/2013  . Intolerance to CAPD peritoneal dialysis s/p CAPD cath removal 07/09/2013 07/05/2013  . Critical lower limb ischemia 06/30/2013  . Extremity pain 06/30/2013  . Atherosclerosis of native arteries of the extremities with ulceration(440.23) 06/17/2013  . Gait disorder 05/31/2013  . Weakness 05/24/2013  . Depression 04/03/2013  . GERD (gastroesophageal reflux disease) 04/03/2013  . Atrial fibrillation 04/02/2013  . DM (diabetes mellitus) 12/28/2012  . Other complications due to renal dialysis device, implant, and graft 05/19/2012  . Diabetes mellitus, type 2 11/13/2011  . Anemia 07/02/2011  . ESRD on hemodialysis 05/29/2011    Jamey Reas PT, DPT 09/23/2014, 9:51 AM  [facility] [address] [place], [place], [address] Phone: [phone]   Fax:  [phone]

## 2014-09-28 ENCOUNTER — Ambulatory Visit: Payer: Managed Care, Other (non HMO) | Admitting: Physical Therapy

## 2014-09-28 ENCOUNTER — Encounter: Payer: Self-pay | Admitting: Physical Therapy

## 2014-09-28 DIAGNOSIS — R269 Unspecified abnormalities of gait and mobility: Secondary | ICD-10-CM | POA: Diagnosis not present

## 2014-09-28 DIAGNOSIS — R531 Weakness: Secondary | ICD-10-CM

## 2014-09-28 DIAGNOSIS — Z7409 Other reduced mobility: Secondary | ICD-10-CM

## 2014-09-28 NOTE — Therapy (Signed)
[facility] [address] [place], [place], [address] Phone: [phone]   Fax:  [phone]  Physical Therapy Treatment  Patient Details  Name: Paul [name] MRN: [id number] Date of Birth: Sep 06, 1955 Referring Provider:  [name], MD  Encounter Date: 09/28/2014      PT End of Session - 09/28/14 0809    Visit Number 12   Number of Visits 17   Date for PT Re-Evaluation 09/23/14   PT Start Time 0803   PT Stop Time 0847   PT Time Calculation (min) 44 min   Equipment Utilized During Treatment Gait belt   Activity Tolerance Patient tolerated treatment well   Behavior During Therapy [facility] for tasks assessed/performed      Past Medical History  Diagnosis Date  . Open wound of both legs with complication     Pt has had progressive wounds of both LE's including gangrene of the toes and patchy necrosis of the calves.  He has been treated at [facility] by Dr Jerline Pain with hyperbaric O2 5d per week.  He is getting Na Thiosulfate with HD for suspected calciphylaxis. Pt says doctor's aren't sure if these ulcers were diabetic ulcers or calciphylaxis.  He underwent bilat BKA on 07/30/13.  He says that the woun  . ESRD on hemodialysis     Pt has ESRD due to DM.  He had a L upper arm AVF prior to starting HD which was ligated due to L arm steal syndrome.  He started HD in Jan 2014 and did home HD.  The family couldn't cannulate the R arm AVF successfully so by mid 2014 they decided to switch to PD which was done late summer 2014.  In Jan 2015 he was admitted with FTT felt to be due to underdialysis on PD and PD was abandoned and he   . Anemia     esrd  . Hypertension     off of meds due to orthostatic hypotension  . Closed left arm fracture 1967  . Heart murmur   . Corneal abrasion, left   . GERD (gastroesophageal reflux disease)   . History of blood transfusion   . Glomerulosclerosis, diabetic   . Depression   . A-fib   .  Calciphylaxis 05/2013    lower ext  . Diabetes mellitus     Type 1 iddm x 11 yrs  . Diabetic neuropathy   . Cancer     hx duodenal adenoCa 2002, resected    Past Surgical History  Procedure Laterality Date  . Whipple procedure  2002    duodenal ca, Dr. Harlow Asa  . Bone marrow biopsy  2012  . Renal biopsy, percutaneous  2012  . Tonsillectomy    . Portacath placement  2002  . Biliary diversion, external  2002  . Other surgical history      Cyst removed from back  . Av fistula placement  06/07/2011    Procedure: ARTERIOVENOUS (AV) FISTULA CREATION;  Surgeon: Angelia Mould, MD;  Location: [facility] [place];  Service: Vascular;  Laterality: Left;  . Ligation of arteriovenous  fistula  05/22/2012    Procedure: LIGATION OF ARTERIOVENOUS  FISTULA;  Surgeon: Mal Misty, MD;  Location: [place];  Service: Vascular;  Laterality: Left;  Left brachio-cephalic fistula  . Insertion of dialysis catheter  05/22/2012    Procedure: INSERTION OF DIALYSIS CATHETER;  Surgeon: Mal Misty, MD;  Location: [place];  Service: Vascular;  Laterality: N/A;  right internal  jugular vein  . Av fistula placement  06/18/2012    Procedure: ARTERIOVENOUS (AV) FISTULA CREATION;  Surgeon: Mal Misty, MD;  Location: [place];  Service: Vascular;  Laterality: Right;  [place]  . Colonoscopy      Hx: of  . Capd insertion N/A 01/14/2013    Procedure: LAPAROSCOPIC INSERTION CONTINUOUS AMBULATORY PERITONEAL DIALYSIS  (CAPD) CATHETER;  Surgeon: Adin Hector, MD;  Location: [place];  Service: General;  Laterality: N/A;  . Vascular surgery    . Removal of a dialysis catheter    . Capd removal N/A 07/09/2013    Procedure: CONTINUOUS AMBULATORY PERITONEAL DIALYSIS  (CAPD) CATHETER REMOVAL;  Surgeon: Adin Hector, MD;  Location: [place];  Service: General;  Laterality: N/A;  . Amputation Bilateral 07/30/2013    Procedure: AMPUTATION BELOW KNEE;  Surgeon: Newt Minion, MD;  Location: [place];  Service: Orthopedics;  Laterality: Bilateral;   Bilateral Below Knee Amputations  . Amputation Bilateral 09/03/2013    Procedure: AMPUTATION BELOW KNEE;  Surgeon: Newt Minion, MD;  Location: [place];  Service: Orthopedics;  Laterality: Bilateral;  Bilateral Below Knee Amputation Revision  . Lower extremity angiogram N/A 06/14/2013    Procedure: LOWER EXTREMITY ANGIOGRAM;  Surgeon: Angelia Mould, MD;  Location: [facility] CATH LAB;  Service: Cardiovascular;  Laterality: N/A;  . Nephrectomy transplanted organ      There were no vitals filed for this visit.  Visit Diagnosis:  Abnormality of gait  Weakness generalized  Impaired functional mobility and activity tolerance      Subjective Assessment - 09/28/14 0808    Subjective No new complaints. Started back at work yesterday with no issues.    Currently in Pain? No/denies   Pain Score 0-No pain            [facility] Adult PT Treatment/Exercise - 09/28/14 0810    Transfers   Sit to Stand 5: Supervision;With upper extremity assist;With armrests;From chair/3-in-1   Stand to Sit 5: Supervision;With upper extremity assist;With armrests;To chair/3-in-1   Ambulation/Gait   Ambulation/Gait Yes   Ambulation/Gait Assistance 5: Supervision   Ambulation/Gait Assistance Details cues on posture, step length and decreased base of support with gait with single crutch. increased assist needed on gravel and grass vs solid surfaces.                               Ambulation Distance (Feet) 560 Feet  x1 inside, >1000 outside   Assistive device Prostheses;Lofstrands  single crutch   Gait Pattern Step-through pattern;Decreased step length - right;Decreased step length - left;Decreased stride length;Right flexed knee in stance;Left flexed knee in stance;Lateral hip instability;Wide base of support   Ambulation Surface Level;Indoor   Curb 4: Min assist  single crutch   Curb Details (indicate cue type and reason) aerobic steps side by side: up/down x4 reps with single crutch and min assist/cues on sequece  and stance position.   Prosthetics   Current prosthetic wear tolerance (days/week)  7 days /wk   Current prosthetic wear tolerance (#hours/day)  all awake hours   Residual limb condition  pt reports intact   Donning Prosthesis Modified independent (device/increased time)   Doffing Prosthesis Modified independent (device/increased time)            PT Short Term Goals - 09/15/14 0806    PT SHORT TERM GOAL #1   Title Pt will be able to: Ambulate 500 ft. with forearm crutches  and prostheses with supervision. (Target Date: 09/09/2014)   Baseline MET 09/13/14   Status Achieved   PT SHORT TERM GOAL #2   Title Patient will be able to ambulate 90' around furniture with 1 crutch & prostheses with supervision.  (Target Date: 09/09/2014)   Baseline MET 09/13/14   Status Achieved   PT SHORT TERM GOAL #3   Title Pt will be at to:  Perform standing activities for >15 minutes with prostheses with minimal c/o fatigue.  (Target Date: 09/09/2014)   Baseline MET 09/13/14   Status Achieved   PT SHORT TERM GOAL #4   Title Pt will be able to reach 10" & within 5" of floor with support of 1 crutch with supervision. (Target Date: 09/09/2014)   Baseline MET 09/13/14   Time 1   Period Months   Status Achieved   PT SHORT TERM GOAL #5   Title Pt will be able to: negotiate ramp & curb with forearm crutches & prostheses with supervision.  (Target Date: 09/09/2014)   Baseline NOT MET 09/13/14 but progressing   Status Not Met           PT Long Term Goals - 07/27/14 1630    PT LONG TERM GOAL #1   Title Pt will be able to: Ambulate 885ft with LRAD & prostheses modified independent. (Target Date: 10/14/2014)   Time 12   Period Weeks   Status New   PT LONG TERM GOAL #2   Title Pt will be able to: ambulate 200' on uneven (grass) surfaces with LRAD & prostheses modified independent.  (Target Date: 10/14/2014)   Time 12   Period Weeks   Status New   PT LONG TERM GOAL #3   Title Pt will be able to: negotiate ramp,  curb, stairs with LRAD & prostheses modified independent.  (Target Date: 10/14/2014)   Time 12   Period Weeks   Status New   PT LONG TERM GOAL #4   Title Pt will be able to: perform standing activities for > 20 minutes & prostheses with no pain or discomfort.  (Target Date: 10/14/2014)   Time 12   Period Weeks   Status New   PT LONG TERM GOAL #5   Title Berg Balance >/= 45/56 (Target Date: 10/14/2014)   Time 12   Period Weeks   Status New   PT LONG TERM GOAL #6   Title Patient will ambulate in home simulated (100' and single step) with 1 crutch or less restrictive device modified independent. (Target Date: 10/14/2014)   Time 12   Period Weeks   Status New           Plan - 09/28/14 0809    Clinical Impression Statement Pt progressing towards goals. Still with unsteadiness using single crutch on curbs, improved balance on inclines/declines.   Pt will benefit from skilled therapeutic intervention in order to improve on the following deficits Abnormal gait;Decreased activity tolerance;Decreased balance;Decreased endurance;Decreased mobility;Decreased strength   Rehab Potential Good   PT Frequency 2x / week   PT Duration 12 weeks   PT Treatment/Interventions ADLs/Self Care Home Management;Gait training;Stair training;Functional mobility training;Therapeutic activities;Therapeutic exercise;Balance training;Neuromuscular re-education;Patient/family education   PT Next Visit Plan Continue toward LTGs: gait wtih single crutch/no AD, curb negotiation, balance and strengthening   Consulted and Agree with Plan of Care Patient;Family member/caregiver   Family Member Consulted wife        Problem List Patient Active Problem List   Diagnosis Date Noted  . Abnormal  cardiovascular stress test 12/29/2013  . Awaiting organ transplant 12/09/2013  . Leg ulcer 11/17/2013  . Chronic kidney disease (CKD), stage V 11/17/2013  . Diabetes 11/17/2013  . Cancer of duodenum 11/17/2013  . Dyslipidemia  11/17/2013  . Chronic kidney disease requiring chronic dialysis 11/17/2013  . Hypercholesteremia 11/17/2013  . H/O malignant neoplasm 11/17/2013  . H/O: HTN (hypertension) 11/17/2013  . BP (high blood pressure) 11/17/2013  . Hypotension 11/17/2013  . Angiopathy, peripheral 11/17/2013  . AF (paroxysmal atrial fibrillation) 11/17/2013  . BKA stump complication 55/05/5866  . S/P bilateral BKA (below knee amputation) 08/03/2013  . S/P BKA (below knee amputation) bilateral 07/30/2013  . Open wound of both legs with complication 25/74/9355  . Acute blood loss anemia 07/12/2013  . Syncope 07/09/2013  . Intolerance to CAPD peritoneal dialysis s/p CAPD cath removal 07/09/2013 07/05/2013  . Critical lower limb ischemia 06/30/2013  . Extremity pain 06/30/2013  . Atherosclerosis of native arteries of the extremities with ulceration(440.23) 06/17/2013  . Gait disorder 05/31/2013  . Weakness 05/24/2013  . Depression 04/03/2013  . GERD (gastroesophageal reflux disease) 04/03/2013  . Atrial fibrillation 04/02/2013  . DM (diabetes mellitus) 12/28/2012  . Other complications due to renal dialysis device, implant, and graft 05/19/2012  . Diabetes mellitus, type 2 11/13/2011  . Anemia 07/02/2011  . ESRD on hemodialysis 05/29/2011    Willow Ora 09/28/2014, 12:24 PM  Willow Ora, PTA, [facility] [address] [place], [place] [address] [phone] 09/28/2014, 12:24 PM

## 2014-09-30 ENCOUNTER — Encounter: Payer: Self-pay | Admitting: Physical Therapy

## 2014-09-30 ENCOUNTER — Ambulatory Visit: Payer: Managed Care, Other (non HMO) | Admitting: Physical Therapy

## 2014-09-30 DIAGNOSIS — R269 Unspecified abnormalities of gait and mobility: Secondary | ICD-10-CM

## 2014-09-30 DIAGNOSIS — Z7409 Other reduced mobility: Secondary | ICD-10-CM

## 2014-09-30 DIAGNOSIS — R531 Weakness: Secondary | ICD-10-CM

## 2014-09-30 NOTE — Therapy (Signed)
[facility] [address] [place], [place], [address] Phone: [phone]   Fax:  [phone]  Physical Therapy Treatment  Patient Details  Name: Paul [name] MRN: [id number] Date of Birth: 03-21-1956 Referring Provider:  [name], MD  Encounter Date: 09/30/2014      PT End of Session - 09/30/14 0808    Visit Number 12   Number of Visits 17   Date for PT Re-Evaluation 09/23/14   PT Start Time 0804   PT Stop Time 0845   PT Time Calculation (min) 41 min   Equipment Utilized During Treatment Gait belt   Activity Tolerance Patient tolerated treatment well   Behavior During Therapy [facility] for tasks assessed/performed      Past Medical History  Diagnosis Date  . Open wound of both legs with complication     Pt has had progressive wounds of both LE's including gangrene of the toes and patchy necrosis of the calves.  He has been treated at [facility] by Dr Jerline Pain with hyperbaric O2 5d per week.  He is getting Na Thiosulfate with HD for suspected calciphylaxis. Pt says doctor's aren't sure if these ulcers were diabetic ulcers or calciphylaxis.  He underwent bilat BKA on 07/30/13.  He says that the woun  . ESRD on hemodialysis     Pt has ESRD due to DM.  He had a L upper arm AVF prior to starting HD which was ligated due to L arm steal syndrome.  He started HD in Jan 2014 and did home HD.  The family couldn't cannulate the R arm AVF successfully so by mid 2014 they decided to switch to PD which was done late summer 2014.  In Jan 2015 he was admitted with FTT felt to be due to underdialysis on PD and PD was abandoned and he   . Anemia     esrd  . Hypertension     off of meds due to orthostatic hypotension  . Closed left arm fracture 1967  . Heart murmur   . Corneal abrasion, left   . GERD (gastroesophageal reflux disease)   . History of blood transfusion   . Glomerulosclerosis, diabetic   . Depression   . A-fib   .  Calciphylaxis 05/2013    lower ext  . Diabetes mellitus     Type 1 iddm x 11 yrs  . Diabetic neuropathy   . Cancer     hx duodenal adenoCa 2002, resected    Past Surgical History  Procedure Laterality Date  . Whipple procedure  2002    duodenal ca, Dr. Harlow Asa  . Bone marrow biopsy  2012  . Renal biopsy, percutaneous  2012  . Tonsillectomy    . Portacath placement  2002  . Biliary diversion, external  2002  . Other surgical history      Cyst removed from back  . Av fistula placement  06/07/2011    Procedure: ARTERIOVENOUS (AV) FISTULA CREATION;  Surgeon: Angelia Mould, MD;  Location: [place];  Service: Vascular;  Laterality: Left;  . Ligation of arteriovenous  fistula  05/22/2012    Procedure: LIGATION OF ARTERIOVENOUS  FISTULA;  Surgeon: Mal Misty, MD;  Location: [place];  Service: Vascular;  Laterality: Left;  Left brachio-cephalic fistula  . Insertion of dialysis catheter  05/22/2012    Procedure: INSERTION OF DIALYSIS CATHETER;  Surgeon: Mal Misty, MD;  Location: [place];  Service: Vascular;  Laterality: N/A;  right internal  jugular vein  . Av fistula placement  06/18/2012    Procedure: ARTERIOVENOUS (AV) FISTULA CREATION;  Surgeon: Mal Misty, MD;  Location: [place];  Service: Vascular;  Laterality: Right;  [place]  . Colonoscopy      Hx: of  . Capd insertion N/A 01/14/2013    Procedure: LAPAROSCOPIC INSERTION CONTINUOUS AMBULATORY PERITONEAL DIALYSIS  (CAPD) CATHETER;  Surgeon: Adin Hector, MD;  Location: [place];  Service: General;  Laterality: N/A;  . Vascular surgery    . Removal of a dialysis catheter    . Capd removal N/A 07/09/2013    Procedure: CONTINUOUS AMBULATORY PERITONEAL DIALYSIS  (CAPD) CATHETER REMOVAL;  Surgeon: Adin Hector, MD;  Location: [place];  Service: General;  Laterality: N/A;  . Amputation Bilateral 07/30/2013    Procedure: AMPUTATION BELOW KNEE;  Surgeon: Newt Minion, MD;  Location: [place];  Service: Orthopedics;  Laterality: Bilateral;   Bilateral Below Knee Amputations  . Amputation Bilateral 09/03/2013    Procedure: AMPUTATION BELOW KNEE;  Surgeon: Newt Minion, MD;  Location: [facility];  Service: Orthopedics;  Laterality: Bilateral;  Bilateral Below Knee Amputation Revision  . Lower extremity angiogram N/A 06/14/2013    Procedure: LOWER EXTREMITY ANGIOGRAM;  Surgeon: Angelia Mould, MD;  Location: [facility] CATH LAB;  Service: Cardiovascular;  Laterality: N/A;  . Nephrectomy transplanted organ      There were no vitals filed for this visit.  Visit Diagnosis:  Abnormality of gait  Impaired functional mobility and activity tolerance  Weakness generalized      Subjective Assessment - 09/30/14 0808    Subjective No new complaints.    Currently in Pain? No/denies           [facility] Adult PT Treatment/Exercise - 09/30/14 7342    Transfers   Sit to Stand 5: Supervision;With upper extremity assist;With armrests;From chair/3-in-1   Stand to Sit 5: Supervision;With upper extremity assist;With armrests;To chair/3-in-1   Ambulation/Gait   Ambulation/Gait Yes   Ambulation/Gait Assistance 5: Supervision   Ambulation Distance (Feet) 880 Feet   Assistive device Prostheses;Lofstrands  single crutch   Gait Pattern Step-through pattern;Decreased step length - right;Decreased step length - left;Decreased stride length;Right flexed knee in stance;Left flexed knee in stance;Lateral hip instability;Wide base of support   Ambulation Surface Level;Indoor   Curb 4: Min assist  with prostheses and single crutch   Curb Details (indicate cue type and reason) aerobic steps side by side x 6 reps, min guard assist with ascending and min assist to descend. cues on posture and foot placement.   Knee/Hip Exercises: Aerobic   Stationary Bike Scifit legs only level 4.0 x 10 mintues with goal rpm >/= 45 for strengthening and activity tolerance.           PT Short Term Goals - 09/15/14 0806    PT SHORT TERM GOAL #1   Title Pt will be able to:  Ambulate 500 ft. with forearm crutches and prostheses with supervision. (Target Date: 09/09/2014)   Baseline MET 09/13/14   Status Achieved   PT SHORT TERM GOAL #2   Title Patient will be able to ambulate 32' around furniture with 1 crutch & prostheses with supervision.  (Target Date: 09/09/2014)   Baseline MET 09/13/14   Status Achieved   PT SHORT TERM GOAL #3   Title Pt will be at to:  Perform standing activities for >15 minutes with prostheses with minimal c/o fatigue.  (Target Date: 09/09/2014)   Baseline MET 09/13/14  Status Achieved   PT SHORT TERM GOAL #4   Title Pt will be able to reach 10" & within 5" of floor with support of 1 crutch with supervision. (Target Date: 09/09/2014)   Baseline MET 09/13/14   Time 1   Period Months   Status Achieved   PT SHORT TERM GOAL #5   Title Pt will be able to: negotiate ramp & curb with forearm crutches & prostheses with supervision.  (Target Date: 09/09/2014)   Baseline NOT MET 09/13/14 but progressing   Status Not Met           PT Long Term Goals - 07/27/14 1630    PT LONG TERM GOAL #1   Title Pt will be able to: Ambulate 847f with LRAD & prostheses modified independent. (Target Date: 10/14/2014)   Time 12   Period Weeks   Status New   PT LONG TERM GOAL #2   Title Pt will be able to: ambulate 200' on uneven (grass) surfaces with LRAD & prostheses modified independent.  (Target Date: 10/14/2014)   Time 12   Period Weeks   Status New   PT LONG TERM GOAL #3   Title Pt will be able to: negotiate ramp, curb, stairs with LRAD & prostheses modified independent.  (Target Date: 10/14/2014)   Time 12   Period Weeks   Status New   PT LONG TERM GOAL #4   Title Pt will be able to: perform standing activities for > 20 minutes & prostheses with no pain or discomfort.  (Target Date: 10/14/2014)   Time 12   Period Weeks   Status New   PT LONG TERM GOAL #5   Title Berg Balance >/= 45/56 (Target Date: 10/14/2014)   Time 12   Period Weeks   Status New    PT LONG TERM GOAL #6   Title Patient will ambulate in home simulated (100' and single step) with 1 crutch or less restrictive device modified independent. (Target Date: 10/14/2014)   Time 12   Period Weeks   Status New           Plan - 09/30/14 03559   Clinical Impression Statement Continued to work on shorter curbs with single crutch, pt most challenged with descending. Reincorporated the scifit today for activity tolearance as pt has not had time to work on this out of therapy. Pt making progress toward goals.   Pt will benefit from skilled therapeutic intervention in order to improve on the following deficits Abnormal gait;Decreased activity tolerance;Decreased balance;Decreased endurance;Decreased mobility;Decreased strength   Rehab Potential Good   PT Frequency 2x / week   PT Duration 12 weeks   PT Treatment/Interventions ADLs/Self Care Home Management;Gait training;Stair training;Functional mobility training;Therapeutic activities;Therapeutic exercise;Balance training;Neuromuscular re-education;Patient/family education   PT Next Visit Plan Continue toward LTGs: gait wtih single crutch/no AD, curb negotiation, balance and strengthening   Consulted and Agree with Plan of Care Patient;Family member/caregiver   Family Member Consulted wife        Problem List Patient Active Problem List   Diagnosis Date Noted  . Abnormal cardiovascular stress test 12/29/2013  . Awaiting organ transplant 12/09/2013  . Leg ulcer 11/17/2013  . Chronic kidney disease (CKD), stage V 11/17/2013  . Diabetes 11/17/2013  . Cancer of duodenum 11/17/2013  . Dyslipidemia 11/17/2013  . Chronic kidney disease requiring chronic dialysis 11/17/2013  . Hypercholesteremia 11/17/2013  . H/O malignant neoplasm 11/17/2013  . H/O: HTN (hypertension) 11/17/2013  . BP (high blood pressure) 11/17/2013  .  Hypotension 11/17/2013  . Angiopathy, peripheral 11/17/2013  . AF (paroxysmal atrial fibrillation)  11/17/2013  . BKA stump complication 86/76/7209  . S/P bilateral BKA (below knee amputation) 08/03/2013  . S/P BKA (below knee amputation) bilateral 07/30/2013  . Open wound of both legs with complication 47/01/6282  . Acute blood loss anemia 07/12/2013  . Syncope 07/09/2013  . Intolerance to CAPD peritoneal dialysis s/p CAPD cath removal 07/09/2013 07/05/2013  . Critical lower limb ischemia 06/30/2013  . Extremity pain 06/30/2013  . Atherosclerosis of native arteries of the extremities with ulceration(440.23) 06/17/2013  . Gait disorder 05/31/2013  . Weakness 05/24/2013  . Depression 04/03/2013  . GERD (gastroesophageal reflux disease) 04/03/2013  . Atrial fibrillation 04/02/2013  . DM (diabetes mellitus) 12/28/2012  . Other complications due to renal dialysis device, implant, and graft 05/19/2012  . Diabetes mellitus, type 2 11/13/2011  . Anemia 07/02/2011  . ESRD on hemodialysis 05/29/2011    Willow Ora 09/30/2014, 7:32 PM  Willow Ora, PTA, [facility] [address] [place], [place] [address] [phone] 09/30/2014, 7:32 PM

## 2014-10-04 ENCOUNTER — Encounter: Payer: Managed Care, Other (non HMO) | Admitting: Physical Therapy

## 2014-10-06 ENCOUNTER — Ambulatory Visit: Payer: Managed Care, Other (non HMO) | Admitting: Physical Therapy

## 2014-10-06 ENCOUNTER — Encounter: Payer: Self-pay | Admitting: Physical Therapy

## 2014-10-06 ENCOUNTER — Encounter: Payer: Managed Care, Other (non HMO) | Admitting: Physical Therapy

## 2014-10-06 DIAGNOSIS — R531 Weakness: Secondary | ICD-10-CM

## 2014-10-06 DIAGNOSIS — R269 Unspecified abnormalities of gait and mobility: Secondary | ICD-10-CM

## 2014-10-06 DIAGNOSIS — Z7409 Other reduced mobility: Secondary | ICD-10-CM

## 2014-10-06 DIAGNOSIS — R2689 Other abnormalities of gait and mobility: Secondary | ICD-10-CM

## 2014-10-06 NOTE — Therapy (Signed)
[facility] [address] [place], [place], [address] Phone: [phone]   Fax:  [phone]  Physical Therapy Treatment  Patient Details  Name: Paul [name] MRN: [id number] Date of Birth: [birth year] Referring Provider:  [name], MD  Encounter Date: 10/06/2014      PT End of Session - 10/06/14 6010    Visit Number 14  number adjusted due to mis number last visit   Number of Visits 17   Date for PT Re-Evaluation 09/23/14   PT Start Time 0805   PT Stop Time 0843   PT Time Calculation (min) 38 min   Equipment Utilized During Treatment Gait belt   Activity Tolerance Patient tolerated treatment well   Behavior During Therapy [facility] for tasks assessed/performed      Past Medical History  Diagnosis Date  . Open wound of both legs with complication     Pt has had progressive wounds of both LE's including gangrene of the toes and patchy necrosis of the calves.  He has been treated at [facility] by Dr Jerline Pain with hyperbaric O2 5d per week.  He is getting Na Thiosulfate with HD for suspected calciphylaxis. Pt says doctor's aren't sure if these ulcers were diabetic ulcers or calciphylaxis.  He underwent bilat BKA on 07/30/13.  He says that the woun  . ESRD on hemodialysis     Pt has ESRD due to DM.  He had a L upper arm AVF prior to starting HD which was ligated due to L arm steal syndrome.  He started HD in Jan 2014 and did home HD.  The family couldn't cannulate the R arm AVF successfully so by mid 2014 they decided to switch to PD which was done late summer 2014.  In Jan 2015 he was admitted with FTT felt to be due to underdialysis on PD and PD was abandoned and he   . Anemia     esrd  . Hypertension     off of meds due to orthostatic hypotension  . Closed left arm fracture 1967  . Heart murmur   . Corneal abrasion, left   . GERD (gastroesophageal reflux disease)   . History of blood transfusion   .  Glomerulosclerosis, diabetic   . Depression   . A-fib   . Calciphylaxis 05/2013    lower ext  . Diabetes mellitus     Type 1 iddm x 11 yrs  . Diabetic neuropathy   . Cancer     hx duodenal adenoCa 2002, resected    Past Surgical History  Procedure Laterality Date  . Whipple procedure  2002    duodenal ca, Dr. Harlow Asa  . Bone marrow biopsy  2012  . Renal biopsy, percutaneous  2012  . Tonsillectomy    . Portacath placement  2002  . Biliary diversion, external  2002  . Other surgical history      Cyst removed from back  . Av fistula placement  06/07/2011    Procedure: ARTERIOVENOUS (AV) FISTULA CREATION;  Surgeon: Angelia Mould, MD;  Location: [place];  Service: Vascular;  Laterality: Left;  . Ligation of arteriovenous  fistula  05/22/2012    Procedure: LIGATION OF ARTERIOVENOUS  FISTULA;  Surgeon: Mal Misty, MD;  Location: [place];  Service: Vascular;  Laterality: Left;  Left brachio-cephalic fistula  . Insertion of dialysis catheter  05/22/2012    Procedure: INSERTION OF DIALYSIS CATHETER;  Surgeon: Mal Misty, MD;  Location: [place];  Service: Vascular;  Laterality: N/A;  right internal jugular vein  . Av fistula placement  06/18/2012    Procedure: ARTERIOVENOUS (AV) FISTULA CREATION;  Surgeon: Mal Misty, MD;  Location: [place];  Service: Vascular;  Laterality: Right;  [place]  . Colonoscopy      Hx: of  . Capd insertion N/A 01/14/2013    Procedure: LAPAROSCOPIC INSERTION CONTINUOUS AMBULATORY PERITONEAL DIALYSIS  (CAPD) CATHETER;  Surgeon: Adin Hector, MD;  Location: [place];  Service: General;  Laterality: N/A;  . Vascular surgery    . Removal of a dialysis catheter    . Capd removal N/A 07/09/2013    Procedure: CONTINUOUS AMBULATORY PERITONEAL DIALYSIS  (CAPD) CATHETER REMOVAL;  Surgeon: Adin Hector, MD;  Location: [place];  Service: General;  Laterality: N/A;  . Amputation Bilateral 07/30/2013    Procedure: AMPUTATION BELOW KNEE;  Surgeon: Newt Minion, MD;   Location: [place];  Service: Orthopedics;  Laterality: Bilateral;  Bilateral Below Knee Amputations  . Amputation Bilateral 09/03/2013    Procedure: AMPUTATION BELOW KNEE;  Surgeon: Newt Minion, MD;  Location: [place];  Service: Orthopedics;  Laterality: Bilateral;  Bilateral Below Knee Amputation Revision  . Lower extremity angiogram N/A 06/14/2013    Procedure: LOWER EXTREMITY ANGIOGRAM;  Surgeon: Angelia Mould, MD;  [facility]: [facility];  Service: Cardiovascular;  Laterality: N/A;  . Nephrectomy transplanted organ      There were no vitals filed for this visit.  Visit Diagnosis:  Abnormality of gait  Impaired functional mobility and activity tolerance  Balance problems  Weakness generalized      Subjective Assessment - 10/06/14 0820    Subjective No new complaints.  No falls or pain to report. Was able to walk up[place]out [place] park twice this past weekend without issues (using both crutches)   Currently in Pain? No/denies   Pain Score 0-No pain           [facility] Adult PT Treatment/Exercise - 10/06/14 0254    Transfers   Sit to Stand 5: Supervision;With upper extremity assist;With armrests;From chair/3-in-1   Stand to Sit 5: Supervision;With upper extremity assist;With armrests;To chair/3-in-1   Ambulation/Gait   Ambulation/Gait Yes   Ambulation/Gait Assistance 5: Supervision   Ambulation/Gait Assistance Details cues on step length/stride length and posture with gait   Ambulation Distance (Feet) 900 Feet  x1 single crutch; 230 no AD   Assistive device Prostheses;Lofstrands   Gait Pattern Step-through pattern;Decreased step length - right;Decreased step length - left;Decreased stride length;Right flexed knee in stance;Left flexed knee in stance;Lateral hip instability;Wide base of support   Ambulation Surface Level;Indoor   Curb 4: Min assist  with prosthesis/single crutch   Curb Details (indicate cue type and reason) aerobic step: x3 with right, x2 with  left; increased assist needed with crutch on left (needs more practice with crutch on left)   Prosthetics   Current prosthetic wear tolerance (days/week)  7 days /wk   Current prosthetic wear tolerance (#hours/day)  all awake hours   Residual limb condition  pt reports intact   Education Provided Proper wear schedule/adjustment   Person(s) Educated Patient;Spouse   Education Method Explanation   Education Method Verbalized understanding   Donning Prosthesis Modified independent (device/increased time)   Doffing Prosthesis Modified independent (device/increased time)            PT Short Term Goals - 09/15/14 0806    PT SHORT TERM GOAL #1   Title Pt will  be able to: Ambulate 500 ft. with forearm crutches and prostheses with supervision. (Target Date: 09/09/2014)   Baseline MET 09/13/14   Status Achieved   PT SHORT TERM GOAL #2   Title Patient will be able to ambulate 101' around furniture with 1 crutch & prostheses with supervision.  (Target Date: 09/09/2014)   Baseline MET 09/13/14   Status Achieved   PT SHORT TERM GOAL #3   Title Pt will be at to:  Perform standing activities for >15 minutes with prostheses with minimal c/o fatigue.  (Target Date: 09/09/2014)   Baseline MET 09/13/14   Status Achieved   PT SHORT TERM GOAL #4   Title Pt will be able to reach 10" & within 5" of floor with support of 1 crutch with supervision. (Target Date: 09/09/2014)   Baseline MET 09/13/14   Time 1   Period Months   Status Achieved   PT SHORT TERM GOAL #5   Title Pt will be able to: negotiate ramp & curb with forearm crutches & prostheses with supervision.  (Target Date: 09/09/2014)   Baseline NOT MET 09/13/14 but progressing   Status Not Met           PT Long Term Goals - 07/27/14 1630    PT LONG TERM GOAL #1   Title Pt will be able to: Ambulate 854f with LRAD & prostheses modified independent. (Target Date: 10/14/2014)   Time 12   Period Weeks   Status New   PT LONG TERM GOAL #2   Title Pt  will be able to: ambulate 200' on uneven (grass) surfaces with LRAD & prostheses modified independent.  (Target Date: 10/14/2014)   Time 12   Period Weeks   Status New   PT LONG TERM GOAL #3   Title Pt will be able to: negotiate ramp, curb, stairs with LRAD & prostheses modified independent.  (Target Date: 10/14/2014)   Time 12   Period Weeks   Status New   PT LONG TERM GOAL #4   Title Pt will be able to: perform standing activities for > 20 minutes & prostheses with no pain or discomfort.  (Target Date: 10/14/2014)   Time 12   Period Weeks   Status New   PT LONG TERM GOAL #5   Title Berg Balance >/= 45/56 (Target Date: 10/14/2014)   Time 12   Period Weeks   Status New   PT LONG TERM GOAL #6   Title Patient will ambulate in home simulated (100' and single step) with 1 crutch or less restrictive device modified independent. (Target Date: 10/14/2014)   Time 12   Period Weeks   Status New            Plan - 10/06/14 06659   Clinical Impression Statement Pt with increased gait distance with single crutch today with no issues. Progressing toward all other goals as well.   Pt will benefit from skilled therapeutic intervention in order to improve on the following deficits Abnormal gait;Decreased activity tolerance;Decreased balance;Decreased endurance;Decreased mobility;Decreased strength   Rehab Potential Good   PT Frequency 2x / week   PT Duration 12 weeks   PT Treatment/Interventions ADLs/Self Care Home Management;Gait training;Stair training;Functional mobility training;Therapeutic activities;Therapeutic exercise;Balance training;Neuromuscular re-education;Patient/family education   PT Next Visit Plan Continue toward LTGs: gait wtih single crutch/no AD, curb negotiation, balance and strengthening   Consulted and Agree with Plan of Care Patient;Family member/caregiver   Family Member Consulted wife        Problem  List Patient Active Problem List   Diagnosis Date Noted  .  Abnormal cardiovascular stress test 12/29/2013  . Awaiting organ transplant 12/09/2013  . Leg ulcer 11/17/2013  . Chronic kidney disease (CKD), stage V 11/17/2013  . Diabetes 11/17/2013  . Cancer of duodenum 11/17/2013  . Dyslipidemia 11/17/2013  . Chronic kidney disease requiring chronic dialysis 11/17/2013  . Hypercholesteremia 11/17/2013  . H/O malignant neoplasm 11/17/2013  . H/O: HTN (hypertension) 11/17/2013  . BP (high blood pressure) 11/17/2013  . Hypotension 11/17/2013  . Angiopathy, peripheral 11/17/2013  . AF (paroxysmal atrial fibrillation) 11/17/2013  . BKA stump complication 03/55/9741  . S/P bilateral BKA (below knee amputation) 08/03/2013  . S/P BKA (below knee amputation) bilateral 07/30/2013  . Open wound of both legs with complication 63/84/5364  . Acute blood loss anemia 07/12/2013  . Syncope 07/09/2013  . Intolerance to CAPD peritoneal dialysis s/p CAPD cath removal 07/09/2013 07/05/2013  . Critical lower limb ischemia 06/30/2013  . Extremity pain 06/30/2013  . Atherosclerosis of native arteries of the extremities with ulceration(440.23) 06/17/2013  . Gait disorder 05/31/2013  . Weakness 05/24/2013  . Depression 04/03/2013  . GERD (gastroesophageal reflux disease) 04/03/2013  . Atrial fibrillation 04/02/2013  . DM (diabetes mellitus) 12/28/2012  . Other complications due to renal dialysis device, implant, and graft 05/19/2012  . Diabetes mellitus, type 2 11/13/2011  . Anemia 07/02/2011  . ESRD on hemodialysis 05/29/2011    Willow Ora 10/06/2014, 6:49 PM  Willow Ora, PTA, [employer] [address], [place], [place] [address] [phone] 10/06/2014, 6:49 PM

## 2014-10-11 ENCOUNTER — Ambulatory Visit: Payer: Managed Care, Other (non HMO) | Admitting: Physical Therapy

## 2014-10-11 DIAGNOSIS — Z89512 Acquired absence of left leg below knee: Secondary | ICD-10-CM

## 2014-10-11 DIAGNOSIS — R269 Unspecified abnormalities of gait and mobility: Secondary | ICD-10-CM | POA: Diagnosis not present

## 2014-10-11 DIAGNOSIS — R531 Weakness: Secondary | ICD-10-CM

## 2014-10-11 DIAGNOSIS — Z7409 Other reduced mobility: Secondary | ICD-10-CM

## 2014-10-11 DIAGNOSIS — R2689 Other abnormalities of gait and mobility: Secondary | ICD-10-CM

## 2014-10-11 DIAGNOSIS — Z89511 Acquired absence of right leg below knee: Secondary | ICD-10-CM

## 2014-10-11 NOTE — Therapy (Signed)
[facility] [address] [place], [place], [address] Phone: [phone]   Fax:  [phone]  Physical Therapy Treatment  Patient Details  Name: Paul [name] MRN: [id number] Date of Birth: [birth year] Referring Provider:  [name], MD  Encounter Date: 10/11/2014      PT End of Session - 10/11/14 1002    Visit Number 15   Number of Visits 17   Date for PT Re-Evaluation 09/23/14   Authorization Type [employer]   PT Start Time 0803   PT Stop Time 0845   PT Time Calculation (min) 42 min   Equipment Utilized During Treatment Gait belt   Activity Tolerance Patient tolerated treatment well   Behavior During Therapy [facility] for tasks assessed/performed      Past Medical History  Diagnosis Date  . Open wound of both legs with complication     Pt has had progressive wounds of both LE's including gangrene of the toes and patchy necrosis of the calves.  He has been treated at [facility] by Dr Jerline Pain with hyperbaric O2 5d per week.  He is getting Na Thiosulfate with HD for suspected calciphylaxis. Pt says doctor's aren't sure if these ulcers were diabetic ulcers or calciphylaxis.  He underwent bilat BKA on 07/30/13.  He says that the woun  . ESRD on hemodialysis     Pt has ESRD due to DM.  He had a L upper arm AVF prior to starting HD which was ligated due to L arm steal syndrome.  He started HD in Jan 2014 and did home HD.  The family couldn't cannulate the R arm AVF successfully so by mid 2014 they decided to switch to PD which was done late summer 2014.  In Jan 2015 he was admitted with FTT felt to be due to underdialysis on PD and PD was abandoned and he   . Anemia     esrd  . Hypertension     off of meds due to orthostatic hypotension  . Closed left arm fracture 1967  . Heart murmur   . Corneal abrasion, left   . GERD (gastroesophageal reflux disease)   . History of blood transfusion   . Glomerulosclerosis, diabetic   .  Depression   . A-fib   . Calciphylaxis 05/2013    lower ext  . Diabetes mellitus     Type 1 iddm x 11 yrs  . Diabetic neuropathy   . Cancer     hx duodenal adenoCa 2002, resected    Past Surgical History  Procedure Laterality Date  . Whipple procedure  2002    duodenal ca, Dr. Harlow Asa  . Bone marrow biopsy  2012  . Renal biopsy, percutaneous  2012  . Tonsillectomy    . Portacath placement  2002  . Biliary diversion, external  2002  . Other surgical history      Cyst removed from back  . Av fistula placement  06/07/2011    Procedure: ARTERIOVENOUS (AV) FISTULA CREATION;  Surgeon: Angelia Mould, MD;  Location: [place];  Service: Vascular;  Laterality: Left;  . Ligation of arteriovenous  fistula  05/22/2012    Procedure: LIGATION OF ARTERIOVENOUS  FISTULA;  Surgeon: Mal Misty, MD;  Location: [place];  Service: Vascular;  Laterality: Left;  Left brachio-cephalic fistula  . Insertion of dialysis catheter  05/22/2012    Procedure: INSERTION OF DIALYSIS CATHETER;  Surgeon: Mal Misty, MD;  Location: [place];  Service: Vascular;  Laterality: N/A;  right internal jugular vein  . Av fistula placement  06/18/2012    Procedure: ARTERIOVENOUS (AV) FISTULA CREATION;  Surgeon: Mal Misty, MD;  Location: [place];  Service: Vascular;  Laterality: Right;  [place]  . Colonoscopy      Hx: of  . Capd insertion N/A 01/14/2013    Procedure: LAPAROSCOPIC INSERTION CONTINUOUS AMBULATORY PERITONEAL DIALYSIS  (CAPD) CATHETER;  Surgeon: Adin Hector, MD;  Location: [place];  Service: General;  Laterality: N/A;  . Vascular surgery    . Removal of a dialysis catheter    . Capd removal N/A 07/09/2013    Procedure: CONTINUOUS AMBULATORY PERITONEAL DIALYSIS  (CAPD) CATHETER REMOVAL;  Surgeon: Adin Hector, MD;  Location: [place];  Service: General;  Laterality: N/A;  . Amputation Bilateral 07/30/2013    Procedure: AMPUTATION BELOW KNEE;  Surgeon: Newt Minion, MD;  Location: [place];  Service: Orthopedics;   Laterality: Bilateral;  Bilateral Below Knee Amputations  . Amputation Bilateral 09/03/2013    Procedure: AMPUTATION BELOW KNEE;  Surgeon: Newt Minion, MD;  Location: [place];  Service: Orthopedics;  Laterality: Bilateral;  Bilateral Below Knee Amputation Revision  . Lower extremity angiogram N/A 06/14/2013    Procedure: LOWER EXTREMITY ANGIOGRAM;  Surgeon: Angelia Mould, MD;  Location[facility] CATH LAB;  Service: Cardiovascular;  Laterality: N/A;  . Nephrectomy transplanted organ      There were no vitals filed for this visit.  Visit Diagnosis:  Abnormality of gait  Impaired functional mobility and activity tolerance  Balance problems  Weakness generalized  S/P bilateral BKA (below knee amputation)      Subjective Assessment - 10/11/14 0806    Subjective pt reports some fatigue after working. no falls or pain to report.    Patient is accompained by: Family member   Patient Stated Goals Return to at least strength & mobility prior to transplant and then progress to more mobility.   Currently in Pain? No/denies          [facility] Adult PT Treatment/Exercise - 10/11/14 0001    Transfers   Sit to Stand 5: Supervision   Stand to Sit 5: Supervision   Ambulation/Gait   Ambulation/Gait Yes   Ambulation/Gait Assistance 5: Supervision   Ambulation/Gait Assistance Details cues for step and stride length, as well as posture   Ambulation Distance (Feet) 212 Feet   Assistive device Prostheses;Lofstrands  one crutch   Gait Pattern Step-through pattern;Decreased step length - right;Decreased step length - left;Decreased stride length;Right flexed knee in stance;Left flexed knee in stance;Lateral hip instability;Wide base of support   Ambulation Surface Level;Indoor   Curb 4: Min assist  single crutch   Curb Details (indicate cue type and reason) aeorbic step x 2 with crutch in R arm and x 2 with crutch in L arm   Dynamic Standing Balance   Dynamic Standing - Balance Support Bilateral  upper extremity supported  pt encourged to only use parallel bars when necessary   Dynamic Standing - Level of Assistance 4: Min assist   Dynamic Standing - Balance Activities Compliant surfaces   Dynamic Standing - Comments pt practiced maintaining stance with feet close together and semi-tandem stance   High Level Balance   High Level Balance Activities Side stepping;Backward walking;Tandem walking   High Level Balance Comments pt perfromed each activity 4 laps at the parallel bars   Knee/Hip Exercises: Aerobic   Stationary Bike Scifit legs only level 4.5 x 10 mintues with goal  rpm >/= 45 for strengthening and activity tolerance.      Gait Training continued from above:  -Pt ambulated 75 feet on level indoor surface with CGA and without AD. Cues for posture and weight shifting.         PT Short Term Goals - 09/15/14 0806    PT SHORT TERM GOAL #1   Title Pt will be able to: Ambulate 500 ft. with forearm crutches and prostheses with supervision. (Target Date: 09/09/2014)   Baseline MET 09/13/14   Status Achieved   PT SHORT TERM GOAL #2   Title Patient will be able to ambulate 64' around furniture with 1 crutch & prostheses with supervision.  (Target Date: 09/09/2014)   Baseline MET 09/13/14   Status Achieved   PT SHORT TERM GOAL #3   Title Pt will be at to:  Perform standing activities for >15 minutes with prostheses with minimal c/o fatigue.  (Target Date: 09/09/2014)   Baseline MET 09/13/14   Status Achieved   PT SHORT TERM GOAL #4   Title Pt will be able to reach 10" & within 5" of floor with support of 1 crutch with supervision. (Target Date: 09/09/2014)   Baseline MET 09/13/14   Time 1   Period Months   Status Achieved   PT SHORT TERM GOAL #5   Title Pt will be able to: negotiate ramp & curb with forearm crutches & prostheses with supervision.  (Target Date: 09/09/2014)   Baseline NOT MET 09/13/14 but progressing   Status Not Met           PT Long Term Goals - 07/27/14  1630    PT LONG TERM GOAL #1   Title Pt will be able to: Ambulate 850f with LRAD & prostheses modified independent. (Target Date: 10/14/2014)   Time 12   Period Weeks   Status New   PT LONG TERM GOAL #2   Title Pt will be able to: ambulate 200' on uneven (grass) surfaces with LRAD & prostheses modified independent.  (Target Date: 10/14/2014)   Time 12   Period Weeks   Status New   PT LONG TERM GOAL #3   Title Pt will be able to: negotiate ramp, curb, stairs with LRAD & prostheses modified independent.  (Target Date: 10/14/2014)   Time 12   Period Weeks   Status New   PT LONG TERM GOAL #4   Title Pt will be able to: perform standing activities for > 20 minutes & prostheses with no pain or discomfort.  (Target Date: 10/14/2014)   Time 12   Period Weeks   Status New   PT LONG TERM GOAL #5   Title Berg Balance >/= 45/56 (Target Date: 10/14/2014)   Time 12   Period Weeks   Status New   PT LONG TERM GOAL #6   Title Patient will ambulate in home simulated (100' and single step) with 1 crutch or less restrictive device modified independent. (Target Date: 10/14/2014)   Time 12   Period Weeks   Status New            Plan - 10/11/14 1004    Clinical Impression Statement pt is progressing towards goals. Today's treatment focused on balance activities with limited UE support as well as curb management. pt continues to ambulate with single crutch with no issues. pt demo significant unsteadiness with no AD during gait training.    Pt will benefit from skilled therapeutic intervention in order to improve on the following  deficits Abnormal gait;Decreased activity tolerance;Decreased balance;Decreased endurance;Decreased mobility;Decreased strength   Rehab Potential Good   PT Frequency 2x / week   PT Duration 12 weeks   PT Treatment/Interventions ADLs/Self Care Home Management;Gait training;Stair training;Functional mobility training;Therapeutic activities;Therapeutic exercise;Balance  training;Neuromuscular re-education;Patient/family education   PT Next Visit Plan Continue toward LTGs: gait wtih single crutch/no AD, curb negotiation, balance and strengthening   Consulted and Agree with Plan of Care Patient;Family member/caregiver        Problem List Patient Active Problem List   Diagnosis Date Noted  . Abnormal cardiovascular stress test 12/29/2013  . Awaiting organ transplant 12/09/2013  . Leg ulcer 11/17/2013  . Chronic kidney disease (CKD), stage V 11/17/2013  . Diabetes 11/17/2013  . Cancer of duodenum 11/17/2013  . Dyslipidemia 11/17/2013  . Chronic kidney disease requiring chronic dialysis 11/17/2013  . Hypercholesteremia 11/17/2013  . H/O malignant neoplasm 11/17/2013  . H/O: HTN (hypertension) 11/17/2013  . BP (high blood pressure) 11/17/2013  . Hypotension 11/17/2013  . Angiopathy, peripheral 11/17/2013  . AF (paroxysmal atrial fibrillation) 11/17/2013  . BKA stump complication 02/15/5746  . S/P bilateral BKA (below knee amputation) 08/03/2013  . S/P BKA (below knee amputation) bilateral 07/30/2013  . Open wound of both legs with complication 34/07/7094  . Acute blood loss anemia 07/12/2013  . Syncope 07/09/2013  . Intolerance to CAPD peritoneal dialysis s/p CAPD cath removal 07/09/2013 07/05/2013  . Critical lower limb ischemia 06/30/2013  . Extremity pain 06/30/2013  . Atherosclerosis of native arteries of the extremities with ulceration(440.23) 06/17/2013  . Gait disorder 05/31/2013  . Weakness 05/24/2013  . Depression 04/03/2013  . GERD (gastroesophageal reflux disease) 04/03/2013  . Atrial fibrillation 04/02/2013  . DM (diabetes mellitus) 12/28/2012  . Other complications due to renal dialysis device, implant, and graft 05/19/2012  . Diabetes mellitus, type 2 11/13/2011  . Anemia 07/02/2011  . ESRD on hemodialysis 05/29/2011    Fanny Dance 10/11/2014, 12:59 PM  [facility] [address] [place], [place], [address] Phone: [phone]   Fax:  [phone]

## 2014-10-13 ENCOUNTER — Encounter: Payer: Self-pay | Admitting: Physical Therapy

## 2014-10-13 ENCOUNTER — Ambulatory Visit: Payer: Managed Care, Other (non HMO) | Admitting: Physical Therapy

## 2014-10-13 DIAGNOSIS — Z89511 Acquired absence of right leg below knee: Secondary | ICD-10-CM

## 2014-10-13 DIAGNOSIS — R2689 Other abnormalities of gait and mobility: Secondary | ICD-10-CM

## 2014-10-13 DIAGNOSIS — R531 Weakness: Secondary | ICD-10-CM

## 2014-10-13 DIAGNOSIS — R269 Unspecified abnormalities of gait and mobility: Secondary | ICD-10-CM

## 2014-10-13 DIAGNOSIS — Z89512 Acquired absence of left leg below knee: Secondary | ICD-10-CM

## 2014-10-13 DIAGNOSIS — Z7409 Other reduced mobility: Secondary | ICD-10-CM

## 2014-10-14 NOTE — Therapy (Signed)
[facility] [address] [place], [place], [address] Phone: [phone]   Fax:  [phone]  Physical Therapy Treatment  Patient Details  Name: Paul [name] MRN: [id number] Date of Birth: Oct 08, 1955 Referring Provider:  [name], MD  Encounter Date: 10/13/2014      PT End of Session - 10/13/14 0800    Visit Number 16   Number of Visits 25   Date for PT Re-Evaluation 11/11/14   Authorization Type [employer]   PT Start Time 0800   PT Stop Time 0846   PT Time Calculation (min) 46 min   Equipment Utilized During Treatment Gait belt   Activity Tolerance Patient tolerated treatment well   Behavior During Therapy [facility] for tasks assessed/performed      Past Medical History  Diagnosis Date  . Open wound of both legs with complication     Pt has had progressive wounds of both LE's including gangrene of the toes and patchy necrosis of the calves.  He has been treated at [facility] by Dr Jerline Pain with hyperbaric O2 5d per week.  He is getting Na Thiosulfate with HD for suspected calciphylaxis. Pt says doctor's aren't sure if these ulcers were diabetic ulcers or calciphylaxis.  He underwent bilat BKA on 07/30/13.  He says that the woun  . ESRD on hemodialysis     Pt has ESRD due to DM.  He had a L upper arm AVF prior to starting HD which was ligated due to L arm steal syndrome.  He started HD in Jan 2014 and did home HD.  The family couldn't cannulate the R arm AVF successfully so by mid 2014 they decided to switch to PD which was done late summer 2014.  In Jan 2015 he was admitted with FTT felt to be due to underdialysis on PD and PD was abandoned and he   . Anemia     esrd  . Hypertension     off of meds due to orthostatic hypotension  . Closed left arm fracture 1967  . Heart murmur   . Corneal abrasion, left   . GERD (gastroesophageal reflux disease)   . History of blood transfusion   . Glomerulosclerosis, diabetic   .  Depression   . A-fib   . Calciphylaxis 05/2013    lower ext  . Diabetes mellitus     Type 1 iddm x 11 yrs  . Diabetic neuropathy   . Cancer     hx duodenal adenoCa 2002, resected    Past Surgical History  Procedure Laterality Date  . Whipple procedure  2002    duodenal ca, Dr. Harlow Asa  . Bone marrow biopsy  2012  . Renal biopsy, percutaneous  2012  . Tonsillectomy    . Portacath placement  2002  . Biliary diversion, external  2002  . Other surgical history      Cyst removed from back  . Av fistula placement  06/07/2011    Procedure: ARTERIOVENOUS (AV) FISTULA CREATION;  Surgeon: Angelia Mould, MD;  Location: [place];  Service: Vascular;  Laterality: Left;  . Ligation of arteriovenous  fistula  05/22/2012    Procedure: LIGATION OF ARTERIOVENOUS  FISTULA;  Surgeon: Mal Misty, MD;  Location: [place];  Service: Vascular;  Laterality: Left;  Left brachio-cephalic fistula  . Insertion of dialysis catheter  05/22/2012    Procedure: INSERTION OF DIALYSIS CATHETER;  Surgeon: Mal Misty, MD;  Location: [place];  Service: Vascular;  Laterality: N/A;  right internal jugular vein  . Av fistula placement  06/18/2012    Procedure: ARTERIOVENOUS (AV) FISTULA CREATION;  Surgeon: Mal Misty, MD;  Location: [place];  Service: Vascular;  Laterality: Right;  [place]  . Colonoscopy      Hx: of  . Capd insertion N/A 01/14/2013    Procedure: LAPAROSCOPIC INSERTION CONTINUOUS AMBULATORY PERITONEAL DIALYSIS  (CAPD) CATHETER;  Surgeon: Adin Hector, MD;  Location: [place];  Service: General;  Laterality: N/A;  . Vascular surgery    . Removal of a dialysis catheter    . Capd removal N/A 07/09/2013    Procedure: CONTINUOUS AMBULATORY PERITONEAL DIALYSIS  (CAPD) CATHETER REMOVAL;  Surgeon: Adin Hector, MD;  Location: [place];  Service: General;  Laterality: N/A;  . Amputation Bilateral 07/30/2013    Procedure: AMPUTATION BELOW KNEE;  Surgeon: Newt Minion, MD;  Location: [place];  Service: Orthopedics;   Laterality: Bilateral;  Bilateral Below Knee Amputations  . Amputation Bilateral 09/03/2013    Procedure: AMPUTATION BELOW KNEE;  Surgeon: Newt Minion, MD;  Location: [place];  Service: Orthopedics;  Laterality: Bilateral;  Bilateral Below Knee Amputation Revision  . Lower extremity angiogram N/A 06/14/2013    Procedure: LOWER EXTREMITY ANGIOGRAM;  Surgeon: Angelia Mould, MD;  Location: [facility] CATH LAB;  Service: Cardiovascular;  Laterality: N/A;  . Nephrectomy transplanted organ      There were no vitals filed for this visit.  Visit Diagnosis:  Abnormality of gait  Impaired functional mobility and activity tolerance  Balance problems  Weakness generalized  S/P bilateral BKA (below knee amputation)      Subjective Assessment - 10/13/14 0807    Subjective Reports elevator at work is broken so taking flight of stairs with caution.   Currently in Pain? No/denies            [facility] PT Assessment - 10/13/14 0800    Berg Balance Test   Sit to Stand Able to stand  independently using hands   Standing Unsupported Able to stand safely 2 minutes   Sitting with Back Unsupported but Feet Supported on Floor or Stool Able to sit safely and securely 2 minutes   Stand to Sit Sits safely with minimal use of hands   Transfers Able to transfer safely, minor use of hands   Standing Unsupported with Eyes Closed Able to stand 10 seconds safely   Standing Ubsupported with Feet Together Able to place feet together independently and stand 1 minute safely   From Standing, Reach Forward with Outstretched Arm Can reach confidently >25 cm (10")   From Standing Position, Pick up Object from Floor Able to pick up shoe safely and easily   From Standing Position, Turn to Look Behind Over each Shoulder Looks behind from both sides and weight shifts well   Turn 360 Degrees Able to turn 360 degrees safely but slowly   Standing Unsupported, Alternately Place Feet on Step/Stool Able to complete >2 steps/needs  minimal assist   Standing Unsupported, One Foot in Front Needs help to step but can hold 15 seconds   Standing on One Leg Tries to lift leg/unable to hold 3 seconds but remains standing independently   Total Score 44                     [facility] Adult PT Treatment/Exercise - 10/13/14 0800    Transfers   Sit to Stand 6: Modified independent (Device/Increase time);Without upper extremity assist;From chair/3-in-1  Stand to Sit 6: Modified independent (Device/Increase time);Without upper extremity assist;To chair/3-in-1   Ambulation/Gait   Ambulation/Gait Yes   Ambulation/Gait Assistance 6: Modified independent (Device/Increase time);5: Supervision  6: 2 forearm crutches; 5: 1 crutch   Ambulation Distance (Feet) 850 Feet  ambulated 400' w/ single crutch then fatigue so 2 crutches   Assistive device Prostheses;Lofstrands  one crutch   Gait Pattern Step-through pattern;Decreased step length - right;Decreased step length - left;Decreased stride length;Wide base of support   Ambulation Surface Indoor;Level;Outdoor;Unlevel;Paved;Gravel;Grass  200' on grass with 1 crutch SBA & 2 crutches Mod Ind.   Gait velocity 1.84 ft/sec   Stairs Yes   Stairs Assistance 6: Modified independent (Device/Increase time);5: Supervision  6: 2 rails or 1 rail/ crutch step-to; 5: rail/crutch recipro   Stairs Assistance Details (indicate cue type and reason) demo & verbal cues on posture & technique to use LEs > UEs   Stair Management Technique Two rails;One rail Right;With crutches;Step to pattern;Alternating pattern;Forwards   Number of Stairs 4  > 5 reps   Door Management 6: Modified independent (Device/Increase time)  1 crutch & prostheses   Ramp 6: Modified independent (Device);5: Supervision  6: 2 crutches or gentle incline 1 crutch; 5: 1 crutch steepe   Curb 6: Modified independent (Device/increase time);4: Min assist  6: 2 crutches; 4: 1 crutch   Dynamic Standing Balance   Dynamic Standing -  Balance Support Bilateral upper extremity supported  pt encourged to only use parallel bars when necessary   Dynamic Standing - Level of Assistance 4: Min assist   Dynamic Standing - Balance Activities Compliant surfaces   High Level Balance   High Level Balance Activities --   High Level Balance Comments --   Knee/Hip Exercises: Aerobic   Stationary Bike --   Prosthetics   Current prosthetic wear tolerance (days/week)  7 days /wk   Current prosthetic wear tolerance (#hours/day)  all awake hours   Current prosthetic weight-bearing tolerance (hours/day)  >20 minutes with no pain or discomfort   Residual limb condition  no issues   Donning Prosthesis Independent   Doffing Prosthesis Independent                PT Education - 10/13/14 0800    Education provided Yes   Education Details increasing activity level with goal prior to amputations & self reflection of habits established while limited mobility   Person(s) Educated Patient;Spouse   Methods Explanation   Comprehension Verbalized understanding          PT Short Term Goals - 09/15/14 0806    PT SHORT TERM GOAL #1   Title Pt will be able to: Ambulate 500 ft. with forearm crutches and prostheses with supervision. (Target Date: 09/09/2014)   Baseline MET 09/13/14   Status Achieved   PT SHORT TERM GOAL #2   Title Patient will be able to ambulate 15' around furniture with 1 crutch & prostheses with supervision.  (Target Date: 09/09/2014)   Baseline MET 09/13/14   Status Achieved   PT SHORT TERM GOAL #3   Title Pt will be at to:  Perform standing activities for >15 minutes with prostheses with minimal c/o fatigue.  (Target Date: 09/09/2014)   Baseline MET 09/13/14   Status Achieved   PT SHORT TERM GOAL #4   Title Pt will be able to reach 10" & within 5" of floor with support of 1 crutch with supervision. (Target Date: 09/09/2014)   Baseline MET 09/13/14   Time 1  Period Months   Status Achieved   PT SHORT TERM GOAL #5    Title Pt will be able to: negotiate ramp & curb with forearm crutches & prostheses with supervision.  (Target Date: 09/09/2014)   Baseline NOT MET 09/13/14 but progressing   Status Not Met           PT Long Term Goals - 10/13/14 0800    PT LONG TERM GOAL #1   Title Pt will be able to: Ambulate 890f with one crutch & prostheses modified independent. (NEW Target Date: 11/11/2014)   Baseline 10/13/2014 Partially MET Patient is independent with 2 crutches as LRAD but his current goal for community is one crutch which is realistic   Time 4   Period Weeks   Status On-going   PT LONG TERM GOAL #2   Title Pt will be able to: ambulate 200' on uneven (grass) surfaces with LRAD & prostheses modified independent.  (NEW Target Date: 11/11/2014)   Baseline 10/13/2014 Partially MET Patient is independent with 2 crutches as LRAD but his current goal for community is one crutch which is realistic   Time 4   Period Weeks   Status On-going   PT LONG TERM GOAL #3   Title Pt will be able to: negotiate ramp, curb, stairs with LRAD & prostheses modified independent. (NEW Target Date: 11/11/2014)   Baseline 10/13/2014 Partially MET Patient is independent with 2 crutches as LRAD on ramp, curb & rails on stairs but his current goal for community is one crutch which is realistic   Time 4   Period Weeks   Status On-going   PT LONG TERM GOAL #4   Title Pt will be able to: perform standing activities for > 20 minutes & prostheses with no pain or discomfort.  (Target Date: 10/14/2014)   Baseline MET 10/13/2014   Time 12   Period Weeks   Status Achieved   PT LONG TERM GOAL #5   Title Berg Balance >/= 45/56 (NEW Target Date: 11/11/2014)   Baseline Progressed to Berg Score of 44/56   Time 4   Period Weeks   Status On-going   PT LONG TERM GOAL #6   Title Patient will ambulate in home simulated (100' and single step) with 1 crutch or less restrictive device modified independent. (NEW Target Date: 11/10/2104)   Baseline  10/13/2014 Partially MET Patient ambulates 100' with 1 crutch safely but needs 2nd crutch or assist to negotiate single step.   Time 4   Period Weeks   Status On-going               Plan - 10/13/14 0800    Clinical Impression Statement Patient has made excellent progress towards LTGs to enable to return to community level mobility (K3) with prostheses. He fully met 1 of 6 and progressed towards remaining LTGs. Patient needs ~4 more weeks of skilled care to function at community level with single crutch, then PT will discharge to enable pt to build strength, endurance & balance with established program. He will need additional PT later if he wants to progress to less restrictive device like a cane.   Pt will benefit from skilled therapeutic intervention in order to improve on the following deficits Abnormal gait;Decreased activity tolerance;Decreased balance;Decreased endurance;Decreased mobility;Decreased strength;Prosthetic Dependency   Rehab Potential Good   PT Frequency 2x / week   PT Duration 4 weeks   PT Treatment/Interventions ADLs/Self Care Home Management;Gait training;Stair training;Functional mobility training;Therapeutic activities;Therapeutic exercise;Balance training;Neuromuscular re-education;Patient/family education  PT Next Visit Plan Continue towards LTGs, instruct in well-rounded fitness plan   Consulted and Agree with Plan of Care Patient;Family member/caregiver   Family Member Consulted wife        Problem List Patient Active Problem List   Diagnosis Date Noted  . Abnormal cardiovascular stress test 12/29/2013  . Awaiting organ transplant 12/09/2013  . Leg ulcer 11/17/2013  . Chronic kidney disease (CKD), stage V 11/17/2013  . Diabetes 11/17/2013  . Cancer of duodenum 11/17/2013  . Dyslipidemia 11/17/2013  . Chronic kidney disease requiring chronic dialysis 11/17/2013  . Hypercholesteremia 11/17/2013  . H/O malignant neoplasm 11/17/2013  . H/O: HTN  (hypertension) 11/17/2013  . BP (high blood pressure) 11/17/2013  . Hypotension 11/17/2013  . Angiopathy, peripheral 11/17/2013  . AF (paroxysmal atrial fibrillation) 11/17/2013  . BKA stump complication 09/01/9299  . S/P bilateral BKA (below knee amputation) 08/03/2013  . S/P BKA (below knee amputation) bilateral 07/30/2013  . Open wound of both legs with complication 23/79/9094  . Acute blood loss anemia 07/12/2013  . Syncope 07/09/2013  . Intolerance to CAPD peritoneal dialysis s/p CAPD cath removal 07/09/2013 07/05/2013  . Critical lower limb ischemia 06/30/2013  . Extremity pain 06/30/2013  . Atherosclerosis of native arteries of the extremities with ulceration(440.23) 06/17/2013  . Gait disorder 05/31/2013  . Weakness 05/24/2013  . Depression 04/03/2013  . GERD (gastroesophageal reflux disease) 04/03/2013  . Atrial fibrillation 04/02/2013  . DM (diabetes mellitus) 12/28/2012  . Other complications due to renal dialysis device, implant, and graft 05/19/2012  . Diabetes mellitus, type 2 11/13/2011  . Anemia 07/02/2011  . ESRD on hemodialysis 05/29/2011    Jamey Reas PT, DPT 10/14/2014, 11:48 AM  [facility] [address] [place], [place], [address] Phone: [phone]   Fax:  [phone]

## 2014-10-18 ENCOUNTER — Encounter: Payer: Managed Care, Other (non HMO) | Admitting: Physical Therapy

## 2014-10-19 ENCOUNTER — Encounter: Payer: Self-pay | Admitting: Physical Therapy

## 2014-10-19 ENCOUNTER — Ambulatory Visit: Payer: Managed Care, Other (non HMO) | Attending: Family Medicine | Admitting: Physical Therapy

## 2014-10-19 DIAGNOSIS — Z7409 Other reduced mobility: Secondary | ICD-10-CM | POA: Diagnosis present

## 2014-10-19 DIAGNOSIS — R29818 Other symptoms and signs involving the nervous system: Secondary | ICD-10-CM | POA: Insufficient documentation

## 2014-10-19 DIAGNOSIS — R531 Weakness: Secondary | ICD-10-CM

## 2014-10-19 DIAGNOSIS — Z940 Kidney transplant status: Secondary | ICD-10-CM | POA: Diagnosis present

## 2014-10-19 DIAGNOSIS — Z89511 Acquired absence of right leg below knee: Secondary | ICD-10-CM | POA: Diagnosis present

## 2014-10-19 DIAGNOSIS — Z89512 Acquired absence of left leg below knee: Secondary | ICD-10-CM | POA: Diagnosis present

## 2014-10-19 DIAGNOSIS — R2689 Other abnormalities of gait and mobility: Secondary | ICD-10-CM

## 2014-10-19 DIAGNOSIS — R269 Unspecified abnormalities of gait and mobility: Secondary | ICD-10-CM

## 2014-10-19 NOTE — Therapy (Signed)
[facility] [address] [place], [place], [address] Phone: [phone]   Fax:  [phone]  Physical Therapy Treatment  Patient Details  Name: Paul [name] MRN: [id number] Date of Birth: Nov 06, 1955 Referring Provider:  [name], MD  Encounter Date: 10/19/2014      PT End of Session - 10/19/14 0930    Visit Number 17   Number of Visits 25   Date for PT Re-Evaluation 11/11/14   Authorization Type [employer]   PT Start Time 0800   PT Stop Time 0840   PT Time Calculation (min) 40 min   Equipment Utilized During Treatment Gait belt   Activity Tolerance Patient tolerated treatment well   Behavior During Therapy [facility] for tasks assessed/performed      Past Medical History  Diagnosis Date  . Open wound of both legs with complication     Pt has had progressive wounds of both LE's including gangrene of the toes and patchy necrosis of the calves.  He has been treated at [facility] by Dr Jerline Pain with hyperbaric O2 5d per week.  He is getting Na Thiosulfate with HD for suspected calciphylaxis. Pt says doctor's aren't sure if these ulcers were diabetic ulcers or calciphylaxis.  He underwent bilat BKA on 07/30/13.  He says that the woun  . ESRD on hemodialysis     Pt has ESRD due to DM.  He had a L upper arm AVF prior to starting HD which was ligated due to L arm steal syndrome.  He started HD in Jan 2014 and did home HD.  The family couldn't cannulate the R arm AVF successfully so by mid 2014 they decided to switch to PD which was done late summer 2014.  In Jan 2015 he was admitted with FTT felt to be due to underdialysis on PD and PD was abandoned and he   . Anemia     esrd  . Hypertension     off of meds due to orthostatic hypotension  . Closed left arm fracture 1967  . Heart murmur   . Corneal abrasion, left   . GERD (gastroesophageal reflux disease)   . History of blood transfusion   . Glomerulosclerosis, diabetic   .  Depression   . A-fib   . Calciphylaxis 05/2013    lower ext  . Diabetes mellitus     Type 1 iddm x 11 yrs  . Diabetic neuropathy   . Cancer     hx duodenal adenoCa 2002, resected    Past Surgical History  Procedure Laterality Date  . Whipple procedure  2002    duodenal ca, Dr. Harlow Asa  . Bone marrow biopsy  2012  . Renal biopsy, percutaneous  2012  . Tonsillectomy    . Portacath placement  2002  . Biliary diversion, external  2002  . Other surgical history      Cyst removed from back  . Av fistula placement  06/07/2011    Procedure: ARTERIOVENOUS (AV) FISTULA CREATION;  Surgeon: Angelia Mould, MD;  Location: [facility];  Service: Vascular;  Laterality: Left;  . Ligation of arteriovenous  fistula  05/22/2012    Procedure: LIGATION OF ARTERIOVENOUS  FISTULA;  Surgeon: Mal Misty, MD;  Location: [place];  Service: Vascular;  Laterality: Left;  Left brachio-cephalic fistula  . Insertion of dialysis catheter  05/22/2012    Procedure: INSERTION OF DIALYSIS CATHETER;  Surgeon: Mal Misty, MD;  Location: [place] [facility];  Service: Vascular;  Laterality: N/A;  right internal jugular vein  . Av fistula placement  06/18/2012    Procedure: ARTERIOVENOUS (AV) FISTULA CREATION;  Surgeon: Mal Misty, MD;  Location: [place];  Service: Vascular;  Laterality: Right;  [place]  . Colonoscopy      Hx: of  . Capd insertion N/A 01/14/2013    Procedure: LAPAROSCOPIC INSERTION CONTINUOUS AMBULATORY PERITONEAL DIALYSIS  (CAPD) CATHETER;  Surgeon: Adin Hector, MD;  Location: [facility];  Service: General;  Laterality: N/A;  . Vascular surgery    . Removal of a dialysis catheter    . Capd removal N/A 07/09/2013    Procedure: CONTINUOUS AMBULATORY PERITONEAL DIALYSIS  (CAPD) CATHETER REMOVAL;  Surgeon: Adin Hector, MD;  Location: [facility];  Service: General;  Laterality: N/A;  . Amputation Bilateral 07/30/2013    Procedure: AMPUTATION BELOW KNEE;  Surgeon: Newt Minion, MD;  Location: [facility];  Service: Orthopedics;   Laterality: Bilateral;  Bilateral Below Knee Amputations  . Amputation Bilateral 09/03/2013    Procedure: AMPUTATION BELOW KNEE;  Surgeon: Newt Minion, MD;  Location: [facility];  Service: Orthopedics;  Laterality: Bilateral;  Bilateral Below Knee Amputation Revision  . Lower extremity angiogram N/A 06/14/2013    Procedure: LOWER EXTREMITY ANGIOGRAM;  Surgeon: Angelia Mould, MD;  Location: [facility];  Service: Cardiovascular;  Laterality: N/A;  . Nephrectomy transplanted organ      There were no vitals filed for this visit.  Visit Diagnosis:  Abnormality of gait  Impaired functional mobility and activity tolerance  Balance problems  Weakness generalized  S/P bilateral BKA (below knee amputation)      Subjective Assessment - 10/19/14 0804    Subjective Went to [facility] for check up and going well.   Currently in Pain? No/denies                         [facility] Adult PT Treatment/Exercise - 10/19/14 0800    Transfers   Sit to Stand 6: Modified independent (Device/Increase time);Without upper extremity assist;From chair/3-in-1   Stand to Sit 6: Modified independent (Device/Increase time);Without upper extremity assist;To chair/3-in-1   Ambulation/Gait   Ambulation/Gait Yes   Ambulation/Gait Assistance 4: Min guard;5: Supervision  initially Min guard with cane progressed to SBA   Ambulation/Gait Assistance Details cues on posture, step width & sequence   Ambulation Distance (Feet) 400 Feet  400' X 1, 150' X 5   Assistive device Prostheses;Straight cane  used various tips to determine preference   Gait Pattern Step-through pattern;Decreased step length - right;Decreased step length - left;Decreased stride length;Wide base of support   Ambulation Surface Indoor;Level   Gait velocity --   Stairs Yes   Stairs Assistance 5: Supervision  1 cane & 1 rail   Stairs Assistance Details (indicate cue type and reason) cues on wt shift & knee flexion to lower wt   Stair  Management Technique One rail Right;Step to pattern;Alternating pattern;Forwards;With cane   Number of Stairs 4  X 4 reps   Door Management --   Ramp 4: Min assist  1 cane & prostheses   Ramp Details (indicate cue type and reason) cues on wt shift & posture   Curb 3: Mod assist  1 cane & prostheses   Curb Details (indicate cue type and reason) demo & cues on technique including sequence, step length   Dynamic Standing Balance   Dynamic Standing - Balance Support --   Dynamic Standing - Level  of Assistance --   Dynamic Standing - Balance Activities --   Prosthetics   Current prosthetic wear tolerance (days/week)  7 days /wk   Current prosthetic wear tolerance (#hours/day)  all awake hours   Current prosthetic weight-bearing tolerance (hours/day)  >20 minutes with no pain or discomfort   Residual limb condition  no issues   Donning Prosthesis Independent   Doffing Prosthesis Independent     Picking up object from floor: using doorframe for balance, safety & UE support assist with cues on mechanics. 3 reps              PT Short Term Goals - 09/15/14 0806    PT SHORT TERM GOAL #1   Title Pt will be able to: Ambulate 500 ft. with forearm crutches and prostheses with supervision. (Target Date: 09/09/2014)   Baseline MET 09/13/14   Status Achieved   PT SHORT TERM GOAL #2   Title Patient will be able to ambulate 66' around furniture with 1 crutch & prostheses with supervision.  (Target Date: 09/09/2014)   Baseline MET 09/13/14   Status Achieved   PT SHORT TERM GOAL #3   Title Pt will be at to:  Perform standing activities for >15 minutes with prostheses with minimal c/o fatigue.  (Target Date: 09/09/2014)   Baseline MET 09/13/14   Status Achieved   PT SHORT TERM GOAL #4   Title Pt will be able to reach 10" & within 5" of floor with support of 1 crutch with supervision. (Target Date: 09/09/2014)   Baseline MET 09/13/14   Time 1   Period Months   Status Achieved   PT SHORT TERM  GOAL #5   Title Pt will be able to: negotiate ramp & curb with forearm crutches & prostheses with supervision.  (Target Date: 09/09/2014)   Baseline NOT MET 09/13/14 but progressing   Status Not Met           PT Long Term Goals - 10/13/14 0800    PT LONG TERM GOAL #1   Title Pt will be able to: Ambulate 827ft with one crutch & prostheses modified independent. (NEW Target Date: 11/11/2014)   Baseline 10/13/2014 Partially MET Patient is independent with 2 crutches as LRAD but his current goal for community is one crutch which is realistic   Time 4   Period Weeks   Status On-going   PT LONG TERM GOAL #2   Title Pt will be able to: ambulate 200' on uneven (grass) surfaces with LRAD & prostheses modified independent.  (NEW Target Date: 11/11/2014)   Baseline 10/13/2014 Partially MET Patient is independent with 2 crutches as LRAD but his current goal for community is one crutch which is realistic   Time 4   Period Weeks   Status On-going   PT LONG TERM GOAL #3   Title Pt will be able to: negotiate ramp, curb, stairs with LRAD & prostheses modified independent. (NEW Target Date: 11/11/2014)   Baseline 10/13/2014 Partially MET Patient is independent with 2 crutches as LRAD on ramp, curb & rails on stairs but his current goal for community is one crutch which is realistic   Time 4   Period Weeks   Status On-going   PT LONG TERM GOAL #4   Title Pt will be able to: perform standing activities for > 20 minutes & prostheses with no pain or discomfort.  (Target Date: 10/14/2014)   Baseline MET 10/13/2014   Time 12   Period Weeks  Status Achieved   PT LONG TERM GOAL #5   Title Berg Balance >/= 45/56 (NEW Target Date: 11/11/2014)   Baseline Progressed to Berg Score of 44/56   Time 4   Period Weeks   Status On-going   PT LONG TERM GOAL #6   Title Patient will ambulate in home simulated (100' and single step) with 1 crutch or less restrictive device modified independent. (NEW Target Date: 11/10/2104)    Baseline 10/13/2014 Partially MET Patient ambulates 100' with 1 crutch safely but needs 2nd crutch or assist to negotiate single step.   Time 4   Period Weeks   Status On-going               Plan - 10/19/14 0800    Clinical Impression Statement Patient improved gait with single point cane & prostheses with instruction & repetition. Patient reports that he could tell less UE assist with a cane over single crutch but felt comfortable with assist by end of session   Pt will benefit from skilled therapeutic intervention in order to improve on the following deficits Abnormal gait;Decreased activity tolerance;Decreased balance;Decreased endurance;Decreased mobility;Decreased strength;Prosthetic Dependency   Rehab Potential Good   PT Frequency 2x / week   PT Duration 4 weeks   PT Treatment/Interventions ADLs/Self Care Home Management;Gait training;Stair training;Functional mobility training;Therapeutic activities;Therapeutic exercise;Balance training;Neuromuscular re-education;Patient/family education   PT Next Visit Plan Continue towards LTGs, instruct in well-rounded fitness plan   Consulted and Agree with Plan of Care Patient;Family member/caregiver   Family Member Consulted wife        Problem List Patient Active Problem List   Diagnosis Date Noted  . Abnormal cardiovascular stress test 12/29/2013  . Awaiting organ transplant 12/09/2013  . Leg ulcer 11/17/2013  . Chronic kidney disease (CKD), stage V 11/17/2013  . Diabetes 11/17/2013  . Cancer of duodenum 11/17/2013  . Dyslipidemia 11/17/2013  . Chronic kidney disease requiring chronic dialysis 11/17/2013  . Hypercholesteremia 11/17/2013  . H/O malignant neoplasm 11/17/2013  . H/O: HTN (hypertension) 11/17/2013  . BP (high blood pressure) 11/17/2013  . Hypotension 11/17/2013  . Angiopathy, peripheral 11/17/2013  . AF (paroxysmal atrial fibrillation) 11/17/2013  . BKA stump complication 38/25/0539  . S/P bilateral BKA  (below knee amputation) 08/03/2013  . S/P BKA (below knee amputation) bilateral 07/30/2013  . Open wound of both legs with complication 76/73/4193  . Acute blood loss anemia 07/12/2013  . Syncope 07/09/2013  . Intolerance to CAPD peritoneal dialysis s/p CAPD cath removal 07/09/2013 07/05/2013  . Critical lower limb ischemia 06/30/2013  . Extremity pain 06/30/2013  . Atherosclerosis of native arteries of the extremities with ulceration(440.23) 06/17/2013  . Gait disorder 05/31/2013  . Weakness 05/24/2013  . Depression 04/03/2013  . GERD (gastroesophageal reflux disease) 04/03/2013  . Atrial fibrillation 04/02/2013  . DM (diabetes mellitus) 12/28/2012  . Other complications due to renal dialysis device, implant, and graft 05/19/2012  . Diabetes mellitus, type 2 11/13/2011  . Anemia 07/02/2011  . ESRD on hemodialysis 05/29/2011    Jamey Reas PT, DPT 10/19/2014, 10:58 AM  [facility] [address] [place], [place], [address] Phone: [phone]   Fax:  [phone]

## 2014-10-21 ENCOUNTER — Encounter: Payer: Self-pay | Admitting: Physical Therapy

## 2014-10-21 ENCOUNTER — Ambulatory Visit: Payer: Managed Care, Other (non HMO) | Admitting: Physical Therapy

## 2014-10-21 DIAGNOSIS — Z7409 Other reduced mobility: Secondary | ICD-10-CM

## 2014-10-21 DIAGNOSIS — R2689 Other abnormalities of gait and mobility: Secondary | ICD-10-CM

## 2014-10-21 DIAGNOSIS — R269 Unspecified abnormalities of gait and mobility: Secondary | ICD-10-CM

## 2014-10-23 NOTE — Therapy (Signed)
[facility] [address] [place], [place], [address] Phone: [phone]   Fax:  [phone]  Physical Therapy Treatment  Patient Details  Name: Paul [name] MRN: [id number] Date of Birth: July 25, 1955 Referring Provider:  [name], MD  Encounter Date: 10/21/2014    10/21/14 0853  PT Visits / Re-Eval  Visit Number 18  Number of Visits 25  Date for PT Re-Evaluation 11/11/14  Authorization  Authorization Type [employer]  PT Time Calculation  PT Start Time 0846  PT Stop Time 0930  PT Time Calculation (min) 44 min  PT - End of Session  Equipment Utilized During Treatment Gait belt  Activity Tolerance Patient tolerated treatment well  Behavior During Therapy [facility] for tasks assessed/performed      10/21/14 0852  Symptoms/Limitations  Subjective No new complaints. No pain or falls to report.  Pain Assessment  Currently in Pain? No/denies  Pain Score 0    Past Medical History  Diagnosis Date  . Open wound of both legs with complication     Pt has had progressive wounds of both LE's including gangrene of the toes and patchy necrosis of the calves.  He has been treated at [facility] by Dr Jerline Pain with hyperbaric O2 5d per week.  He is getting Na Thiosulfate with HD for suspected calciphylaxis. Pt says doctor's aren't sure if these ulcers were diabetic ulcers or calciphylaxis.  He underwent bilat BKA on 07/30/13.  He says that the woun  . ESRD on hemodialysis     Pt has ESRD due to DM.  He had a L upper arm AVF prior to starting HD which was ligated due to L arm steal syndrome.  He started HD in Jan 2014 and did home HD.  The family couldn't cannulate the R arm AVF successfully so by mid 2014 they decided to switch to PD which was done late summer 2014.  In Jan 2015 he was admitted with FTT felt to be due to underdialysis on PD and PD was abandoned and he   . Anemia     esrd  . Hypertension     off of meds due to  orthostatic hypotension  . Closed left arm fracture 1967  . Heart murmur   . Corneal abrasion, left   . GERD (gastroesophageal reflux disease)   . History of blood transfusion   . Glomerulosclerosis, diabetic   . Depression   . A-fib   . Calciphylaxis 05/2013    lower ext  . Diabetes mellitus     Type 1 iddm x 11 yrs  . Diabetic neuropathy   . Cancer     hx duodenal adenoCa 2002, resected    Past Surgical History  Procedure Laterality Date  . Whipple procedure  2002    duodenal ca, Dr. Harlow Asa  . Bone marrow biopsy  2012  . Renal biopsy, percutaneous  2012  . Tonsillectomy    . Portacath placement  2002  . Biliary diversion, external  2002  . Other surgical history      Cyst removed from back  . Av fistula placement  06/07/2011    Procedure: ARTERIOVENOUS (AV) FISTULA CREATION;  Surgeon: Angelia Mould, MD;  Location: [place] [facility];  Service: Vascular;  Laterality: Left;  . Ligation of arteriovenous  fistula  05/22/2012    Procedure: LIGATION OF ARTERIOVENOUS  FISTULA;  Surgeon: Mal Misty, MD;  Location: [place];  Service: Vascular;  Laterality: Left;  Left brachio-cephalic fistula  .  Insertion of dialysis catheter  05/22/2012    Procedure: INSERTION OF DIALYSIS CATHETER;  Surgeon: Mal Misty, MD;  Location: [place];  Service: Vascular;  Laterality: N/A;  right internal jugular vein  . Av fistula placement  06/18/2012    Procedure: ARTERIOVENOUS (AV) FISTULA CREATION;  Surgeon: Mal Misty, MD;  Location: [place];  Service: Vascular;  Laterality: Right;  [place]  . Colonoscopy      Hx: of  . Capd insertion N/A 01/14/2013    Procedure: LAPAROSCOPIC INSERTION CONTINUOUS AMBULATORY PERITONEAL DIALYSIS  (CAPD) CATHETER;  Surgeon: Adin Hector, MD;  Location: [place];  Service: General;  Laterality: N/A;  . Vascular surgery    . Removal of a dialysis catheter    . Capd removal N/A 07/09/2013    Procedure: CONTINUOUS AMBULATORY PERITONEAL DIALYSIS  (CAPD) CATHETER REMOVAL;  Surgeon:  Adin Hector, MD;  Location: [place];  Service: General;  Laterality: N/A;  . Amputation Bilateral 07/30/2013    Procedure: AMPUTATION BELOW KNEE;  Surgeon: Newt Minion, MD;  Location: [place];  Service: Orthopedics;  Laterality: Bilateral;  Bilateral Below Knee Amputations  . Amputation Bilateral 09/03/2013    Procedure: AMPUTATION BELOW KNEE;  Surgeon: Newt Minion, MD;  Location: [place];  Service: Orthopedics;  Laterality: Bilateral;  Bilateral Below Knee Amputation Revision  . Lower extremity angiogram N/A 06/14/2013    Procedure: LOWER EXTREMITY ANGIOGRAM;  Surgeon: Angelia Mould, MD;  Location: [facility] CATH [facility];  Service: Cardiovascular;  Laterality: N/A;  . Nephrectomy transplanted organ      There were no vitals filed for this visit.  Visit Diagnosis:  Abnormality of gait  Impaired functional mobility and activity tolerance  Balance problems      10/21/14 0854  Transfers  Sit to Stand 6: Modified independent (Device/Increase time);Without upper extremity assist;From chair/3-in-1  Stand to Sit 6: Modified independent (Device/Increase time);Without upper extremity assist;To chair/3-in-1  Ambulation/Gait  Ambulation/Gait Yes  Ambulation/Gait Assistance 4: Min guard  Ambulation/Gait Assistance Details cues on posture and equal step length  Ambulation Distance (Feet) 660 Feet  Assistive device Prostheses;Straight cane (with tripod tip)  Gait Pattern Step-through pattern;Decreased step length - right;Decreased step length - left;Decreased stride length;Wide base of support  Ambulation Surface Level;Indoor  Stairs Yes  Stairs Assistance 5: Supervision  Stairs Assistance Details (indicate cue type and reason) cues on cane advancement, hand advancement and occasional cues on foot position with descending stairs  Stair Management Technique One rail Right;One rail Left;Alternating pattern;Forwards;With cane  Number of Stairs 4 (x 3 reps)  Ramp 4: Min assist  Ramp Details  (indicate cue type and reason) x 5 reps, cues on sequence/technique  Curb 3: Mod assist (cane with tripod tip)  Curb Details (indicate cue type and reason) x 5 reps, cues on posture and techique  Prosthetics  Current prosthetic wear tolerance (days/week)  7 days /wk  Current prosthetic wear tolerance (#hours/day)  all awake hours  Residual limb condition  no issues  Donning Prosthesis 7  Doffing Prosthesis 7                PT Short Term Goals - 09/15/14 0806    PT SHORT TERM GOAL #1   Title Pt will be able to: Ambulate 500 ft. with forearm crutches and prostheses with supervision. (Target Date: 09/09/2014)   Baseline MET 09/13/14   Status Achieved   PT SHORT TERM GOAL #2   Title Patient will be able to ambulate 44' around  furniture with 1 crutch & prostheses with supervision.  (Target Date: 09/09/2014)   Baseline MET 09/13/14   Status Achieved   PT SHORT TERM GOAL #3   Title Pt will be at to:  Perform standing activities for >15 minutes with prostheses with minimal c/o fatigue.  (Target Date: 09/09/2014)   Baseline MET 09/13/14   Status Achieved   PT SHORT TERM GOAL #4   Title Pt will be able to reach 10" & within 5" of floor with support of 1 crutch with supervision. (Target Date: 09/09/2014)   Baseline MET 09/13/14   Time 1   Period Months   Status Achieved   PT SHORT TERM GOAL #5   Title Pt will be able to: negotiate ramp & curb with forearm crutches & prostheses with supervision.  (Target Date: 09/09/2014)   Baseline NOT MET 09/13/14 but progressing   Status Not Met           PT Long Term Goals - 10/13/14 0800    PT LONG TERM GOAL #1   Title Pt will be able to: Ambulate 862f with one crutch & prostheses modified independent. (NEW Target Date: 11/11/2014)   Baseline 10/13/2014 Partially MET Patient is independent with 2 crutches as LRAD but his current goal for community is one crutch which is realistic   Time 4   Period Weeks   Status On-going   PT LONG TERM GOAL #2    Title Pt will be able to: ambulate 200' on uneven (grass) surfaces with LRAD & prostheses modified independent.  (NEW Target Date: 11/11/2014)   Baseline 10/13/2014 Partially MET Patient is independent with 2 crutches as LRAD but his current goal for community is one crutch which is realistic   Time 4   Period Weeks   Status On-going   PT LONG TERM GOAL #3   Title Pt will be able to: negotiate ramp, curb, stairs with LRAD & prostheses modified independent. (NEW Target Date: 11/11/2014)   Baseline 10/13/2014 Partially MET Patient is independent with 2 crutches as LRAD on ramp, curb & rails on stairs but his current goal for community is one crutch which is realistic   Time 4   Period Weeks   Status On-going   PT LONG TERM GOAL #4   Title Pt will be able to: perform standing activities for > 20 minutes & prostheses with no pain or discomfort.  (Target Date: 10/14/2014)   Baseline MET 10/13/2014   Time 12   Period Weeks   Status Achieved   PT LONG TERM GOAL #5   Title Berg Balance >/= 45/56 (NEW Target Date: 11/11/2014)   Baseline Progressed to Berg Score of 44/56   Time 4   Period Weeks   Status On-going   PT LONG TERM GOAL #6   Title Patient will ambulate in home simulated (100' and single step) with 1 crutch or less restrictive device modified independent. (NEW Target Date: 11/10/2104)   Baseline 10/13/2014 Partially MET Patient ambulates 100' with 1 crutch safely but needs 2nd crutch or assist to negotiate single step.   Time 4   Period Weeks   Status On-going        10/21/14 0854  Plan  Clinical Impression Statement Pt continues to progress with use of single point cane. Discussed use of one in home or familiar level outdoors places only with spouse assist at this time. Pt/spouse verbalized understanding. Pt making steady progress toward goals.   Pt will benefit from skilled therapeutic  intervention in order to improve on the following deficits Abnormal gait;Decreased activity  tolerance;Decreased balance;Decreased endurance;Decreased mobility;Decreased strength;Prosthetic Dependency  Rehab Potential Good  PT Frequency 2x / week  PT Duration 4 weeks  PT Treatment/Interventions ADLs/Self Care Home Management;Gait training;Stair training;Functional mobility training;Therapeutic activities;Therapeutic exercise;Balance training;Neuromuscular re-education;Patient/family education  PT Next Visit Plan Continue towards LTGs, instruct in well-rounded fitness plan  Consulted and Agree with Plan of Care Patient;Family member/caregiver  Family Member Consulted wife     Problem List Patient Active Problem List   Diagnosis Date Noted  . Abnormal cardiovascular stress test 12/29/2013  . Awaiting organ transplant 12/09/2013  . Leg ulcer 11/17/2013  . Chronic kidney disease (CKD), stage V 11/17/2013  . Diabetes 11/17/2013  . Cancer of duodenum 11/17/2013  . Dyslipidemia 11/17/2013  . Chronic kidney disease requiring chronic dialysis 11/17/2013  . Hypercholesteremia 11/17/2013  . H/O malignant neoplasm 11/17/2013  . H/O: HTN (hypertension) 11/17/2013  . BP (high blood pressure) 11/17/2013  . Hypotension 11/17/2013  . Angiopathy, peripheral 11/17/2013  . AF (paroxysmal atrial fibrillation) 11/17/2013  . BKA stump complication 00/97/9499  . S/P bilateral BKA (below knee amputation) 08/03/2013  . S/P BKA (below knee amputation) bilateral 07/30/2013  . Open wound of both legs with complication 71/82/0990  . Acute blood loss anemia 07/12/2013  . Syncope 07/09/2013  . Intolerance to CAPD peritoneal dialysis s/p CAPD cath removal 07/09/2013 07/05/2013  . Critical lower limb ischemia 06/30/2013  . Extremity pain 06/30/2013  . Atherosclerosis of native arteries of the extremities with ulceration(440.23) 06/17/2013  . Gait disorder 05/31/2013  . Weakness 05/24/2013  . Depression 04/03/2013  . GERD (gastroesophageal reflux disease) 04/03/2013  . Atrial fibrillation 04/02/2013   . DM (diabetes mellitus) 12/28/2012  . Other complications due to renal dialysis device, implant, and graft 05/19/2012  . Diabetes mellitus, type 2 11/13/2011  . Anemia 07/02/2011  . ESRD on hemodialysis 05/29/2011    Willow Ora 10/23/2014, 2:29 PM  Willow Ora, PTA, [facility] [address], [place], [place] [address] [phone] 10/23/2014, 2:29 PM

## 2014-10-25 ENCOUNTER — Encounter: Payer: Self-pay | Admitting: Physical Therapy

## 2014-10-25 ENCOUNTER — Ambulatory Visit: Payer: Managed Care, Other (non HMO) | Admitting: Physical Therapy

## 2014-10-25 DIAGNOSIS — R269 Unspecified abnormalities of gait and mobility: Secondary | ICD-10-CM

## 2014-10-25 DIAGNOSIS — Z7409 Other reduced mobility: Secondary | ICD-10-CM

## 2014-10-25 DIAGNOSIS — R2689 Other abnormalities of gait and mobility: Secondary | ICD-10-CM

## 2014-10-25 NOTE — Therapy (Signed)
[facility] [address] [place], [place], [address] Phone: [phone]   Fax:  [phone]  Physical Therapy Treatment  Patient Details  Name: Paul [name] MRN: [id number] Date of Birth: [birth year] Referring Provider:  [name], MD  Encounter Date: 10/25/2014      PT End of Session - 10/25/14 0810    Visit Number 19   Number of Visits 25   Date for PT Re-Evaluation 11/11/14   Authorization Type [employer]   PT Start Time 0805   PT Stop Time 0843   PT Time Calculation (min) 38 min   Equipment Utilized During Treatment Gait belt   Activity Tolerance Patient tolerated treatment well   Behavior During Therapy [facility] for tasks assessed/performed      Past Medical History  Diagnosis Date  . Open wound of both legs with complication     Pt has had progressive wounds of both LE's including gangrene of the toes and patchy necrosis of the calves.  He has been treated at [facility] by Dr Jerline Pain with hyperbaric O2 5d per week.  He is getting Na Thiosulfate with HD for suspected calciphylaxis. Pt says doctor's aren't sure if these ulcers were diabetic ulcers or calciphylaxis.  He underwent bilat BKA on 07/30/13.  He says that the woun  . ESRD on hemodialysis     Pt has ESRD due to DM.  He had a L upper arm AVF prior to starting HD which was ligated due to L arm steal syndrome.  He started HD in Jan 2014 and did home HD.  The family couldn't cannulate the R arm AVF successfully so by mid 2014 they decided to switch to PD which was done late summer 2014.  In Jan 2015 he was admitted with FTT felt to be due to underdialysis on PD and PD was abandoned and he   . Anemia     esrd  . Hypertension     off of meds due to orthostatic hypotension  . Closed left arm fracture 1967  . Heart murmur   . Corneal abrasion, left   . GERD (gastroesophageal reflux disease)   . History of blood transfusion   . Glomerulosclerosis, diabetic   .  Depression   . A-fib   . Calciphylaxis 05/2013    lower ext  . Diabetes mellitus     Type 1 iddm x 11 yrs  . Diabetic neuropathy   . Cancer     hx duodenal adenoCa 2002, resected    Past Surgical History  Procedure Laterality Date  . Whipple procedure  2002    duodenal ca, Dr. Harlow Asa  . Bone marrow biopsy  2012  . Renal biopsy, percutaneous  2012  . Tonsillectomy    . Portacath placement  2002  . Biliary diversion, external  2002  . Other surgical history      Cyst removed from back  . Av fistula placement  06/07/2011    Procedure: ARTERIOVENOUS (AV) FISTULA CREATION;  Surgeon: Angelia Mould, MD;  Location: [place];  Service: Vascular;  Laterality: Left;  . Ligation of arteriovenous  fistula  05/22/2012    Procedure: LIGATION OF ARTERIOVENOUS  FISTULA;  Surgeon: Mal Misty, MD;  Location: [place];  Service: Vascular;  Laterality: Left;  Left brachio-cephalic fistula  . Insertion of dialysis catheter  05/22/2012    Procedure: INSERTION OF DIALYSIS CATHETER;  Surgeon: Mal Misty, MD;  Location: [place];  Service: Vascular;  Laterality: N/A;  right internal jugular vein  . Av fistula placement  06/18/2012    Procedure: ARTERIOVENOUS (AV) FISTULA CREATION;  Surgeon: Mal Misty, MD;  Location: [place];  Service: Vascular;  Laterality: Right;  [place]  . Colonoscopy      Hx: of  . Capd insertion N/A 01/14/2013    Procedure: LAPAROSCOPIC INSERTION CONTINUOUS AMBULATORY PERITONEAL DIALYSIS  (CAPD) CATHETER;  Surgeon: Adin Hector, MD;  Location: [place];  Service: General;  Laterality: N/A;  . Vascular surgery    . Removal of a dialysis catheter    . Capd removal N/A 07/09/2013    Procedure: CONTINUOUS AMBULATORY PERITONEAL DIALYSIS  (CAPD) CATHETER REMOVAL;  Surgeon: Adin Hector, MD;  Location: [place];  Service: General;  Laterality: N/A;  . Amputation Bilateral 07/30/2013    Procedure: AMPUTATION BELOW KNEE;  Surgeon: Newt Minion, MD;  Location: [place];  Service: Orthopedics;   Laterality: Bilateral;  Bilateral Below Knee Amputations  . Amputation Bilateral 09/03/2013    Procedure: AMPUTATION BELOW KNEE;  Surgeon: Newt Minion, MD;  Location: [place];  Service: Orthopedics;  Laterality: Bilateral;  Bilateral Below Knee Amputation Revision  . Lower extremity angiogram N/A 06/14/2013    Procedure: LOWER EXTREMITY ANGIOGRAM;  Surgeon: Angelia Mould, MD;  Location: [facility] CATH LAB;  Service: Cardiovascular;  Laterality: N/A;  . Nephrectomy transplanted organ      There were no vitals filed for this visit.  Visit Diagnosis:  Abnormality of gait  Impaired functional mobility and activity tolerance  Balance problems      Subjective Assessment - 10/25/14 0810    Subjective No new complaints. No pain or falls to report.   Currently in Pain? No/denies           [facility] Adult PT Treatment/Exercise - 10/25/14 0811    Transfers   Sit to Stand 6: Modified independent (Device/Increase time);Without upper extremity assist;From chair/3-in-1   Stand to Sit 6: Modified independent (Device/Increase time);Without upper extremity assist;To chair/3-in-1   Ambulation/Gait   Ambulation/Gait Yes   Ambulation/Gait Assistance 4: Min guard;5: Supervision   Ambulation/Gait Assistance Details cues on posture and equal step length   Ambulation Distance (Feet) 600 Feet   Assistive device Prostheses;Straight cane  tripod tip   Gait Pattern Step-through pattern;Decreased step length - right;Decreased step length - left;Decreased stride length;Wide base of support   Ambulation Surface Level;Indoor   Stairs Yes   Stairs Assistance 5: Supervision   Stairs Assistance Details (indicate cue type and reason) cues on foot placement with descending stairs   Stair Management Technique One rail Right;One rail Left;Alternating pattern;Forwards;With cane   Number of Stairs 4  x 3 rep   Ramp 4: Min assist   Ramp Details (indicate cue type and reason) x 5 reps, cues on technique/sequence    Curb 4: Min assist;3: Mod assist   Curb Details (indicate cue type and reason) x 5 reps, HHA needed to negotiate curbs,   Prosthetics   Current prosthetic wear tolerance (days/week)  7 days /wk   Current prosthetic wear tolerance (#hours/day)  all awake hours   Residual limb condition  no issues   Donning Prosthesis Independent   Doffing Prosthesis Independent          PT Short Term Goals - 09/15/14 0806    PT SHORT TERM GOAL #1   Title Pt will be able to: Ambulate 500 ft. with forearm crutches and prostheses with supervision. (Target Date: 09/09/2014)   Baseline  MET 09/13/14   Status Achieved   PT SHORT TERM GOAL #2   Title Patient will be able to ambulate 72' around furniture with 1 crutch & prostheses with supervision.  (Target Date: 09/09/2014)   Baseline MET 09/13/14   Status Achieved   PT SHORT TERM GOAL #3   Title Pt will be at to:  Perform standing activities for >15 minutes with prostheses with minimal c/o fatigue.  (Target Date: 09/09/2014)   Baseline MET 09/13/14   Status Achieved   PT SHORT TERM GOAL #4   Title Pt will be able to reach 10" & within 5" of floor with support of 1 crutch with supervision. (Target Date: 09/09/2014)   Baseline MET 09/13/14   Time 1   Period Months   Status Achieved   PT SHORT TERM GOAL #5   Title Pt will be able to: negotiate ramp & curb with forearm crutches & prostheses with supervision.  (Target Date: 09/09/2014)   Baseline NOT MET 09/13/14 but progressing   Status Not Met           PT Long Term Goals - 10/25/14 2992    PT LONG TERM GOAL #1   Title Pt will be able to: Ambulate 870ft with one crutch & prostheses modified independent. (NEW Target Date: 11/11/2014)   Baseline 10/13/2014 Partially MET Patient is independent with 2 crutches as LRAD but his current goal for community is one crutch which is realistic   Time 4   Period Weeks   Status On-going   PT LONG TERM GOAL #2   Title Pt will be able to: ambulate 200' on uneven (grass)  surfaces with LRAD & prostheses modified independent.  (NEW Target Date: 11/11/2014)   Baseline 10/13/2014 Partially MET Patient is independent with 2 crutches as LRAD but his current goal for community is one crutch which is realistic   Time 4   Period Weeks   Status On-going   PT LONG TERM GOAL #3   Title Pt will be able to: negotiate ramp, curb, stairs with LRAD & prostheses modified independent. (NEW Target Date: 11/11/2014)   Baseline 10/13/2014 Partially MET Patient is independent with 2 crutches as LRAD on ramp, curb & rails on stairs but his current goal for community is one crutch which is realistic   Time 4   Period Weeks   Status On-going   PT LONG TERM GOAL #4   Title Pt will be able to: perform standing activities for > 20 minutes & prostheses with no pain or discomfort.  (Target Date: 10/14/2014)   Baseline MET 10/13/2014   Time 12   Period Weeks   Status Achieved   PT LONG TERM GOAL #5   Title Berg Balance >/= 45/56 (NEW Target Date: 11/11/2014)   Baseline Progressed to Berg Score of 44/56   Time 4   Period Weeks   Status On-going   PT LONG TERM GOAL #6   Title Patient will ambulate in home simulated (100' and single step) with 1 crutch or less restrictive device modified independent. (NEW Target Date: 11/10/2104)   Baseline 10/13/2014 Partially MET Patient ambulates 100' with 1 crutch safely but needs 2nd crutch or assist to negotiate single step.   Time 4   Period Weeks   Status On-going           Plan - 10/25/14 4268    Clinical Impression Statement Pt making steady progress toward goals. Spouse going today to get a cane and tip for  pt to use at home.    Pt will benefit from skilled therapeutic intervention in order to improve on the following deficits Abnormal gait;Decreased activity tolerance;Decreased balance;Decreased endurance;Decreased mobility;Decreased strength;Prosthetic Dependency   Rehab Potential Good   PT Frequency 2x / week   PT Duration 4 weeks   PT  Treatment/Interventions ADLs/Self Care Home Management;Gait training;Stair training;Functional mobility training;Therapeutic activities;Therapeutic exercise;Balance training;Neuromuscular re-education;Patient/family education   PT Next Visit Plan Continue towards LTGs, instruct in well-rounded fitness plan   Consulted and Agree with Plan of Care Patient;Family member/caregiver   Family Member Consulted wife        Problem List Patient Active Problem List   Diagnosis Date Noted  . Abnormal cardiovascular stress test 12/29/2013  . Awaiting organ transplant 12/09/2013  . Leg ulcer 11/17/2013  . Chronic kidney disease (CKD), stage V 11/17/2013  . Diabetes 11/17/2013  . Cancer of duodenum 11/17/2013  . Dyslipidemia 11/17/2013  . Chronic kidney disease requiring chronic dialysis 11/17/2013  . Hypercholesteremia 11/17/2013  . H/O malignant neoplasm 11/17/2013  . H/O: HTN (hypertension) 11/17/2013  . BP (high blood pressure) 11/17/2013  . Hypotension 11/17/2013  . Angiopathy, peripheral 11/17/2013  . AF (paroxysmal atrial fibrillation) 11/17/2013  . BKA stump complication 75/17/0017  . S/P bilateral BKA (below knee amputation) 08/03/2013  . S/P BKA (below knee amputation) bilateral 07/30/2013  . Open wound of both legs with complication 49/44/9675  . Acute blood loss anemia 07/12/2013  . Syncope 07/09/2013  . Intolerance to CAPD peritoneal dialysis s/p CAPD cath removal 07/09/2013 07/05/2013  . Critical lower limb ischemia 06/30/2013  . Extremity pain 06/30/2013  . Atherosclerosis of native arteries of the extremities with ulceration(440.23) 06/17/2013  . Gait disorder 05/31/2013  . Weakness 05/24/2013  . Depression 04/03/2013  . GERD (gastroesophageal reflux disease) 04/03/2013  . Atrial fibrillation 04/02/2013  . DM (diabetes mellitus) 12/28/2012  . Other complications due to renal dialysis device, implant, and graft 05/19/2012  . Diabetes mellitus, type 2 11/13/2011  . Anemia  07/02/2011  . ESRD on hemodialysis 05/29/2011    Willow Ora 10/25/2014, 12:46 PM  Willow Ora, PTA, [facility] [address], [place], [place] [address] [phone] 10/25/2014, 12:46 PM

## 2014-10-28 ENCOUNTER — Encounter: Payer: Self-pay | Admitting: Physical Therapy

## 2014-10-28 ENCOUNTER — Ambulatory Visit: Payer: Managed Care, Other (non HMO) | Admitting: Physical Therapy

## 2014-10-28 DIAGNOSIS — R269 Unspecified abnormalities of gait and mobility: Secondary | ICD-10-CM

## 2014-10-28 DIAGNOSIS — Z7409 Other reduced mobility: Secondary | ICD-10-CM

## 2014-10-28 DIAGNOSIS — R531 Weakness: Secondary | ICD-10-CM

## 2014-10-28 NOTE — Therapy (Signed)
[facility] [address] [place], [place], [address] Phone: [phone]   Fax:  [phone]  Physical Therapy Treatment  Patient Details  Name: Paul [name] MRN: [id number] Date of Birth: [birth year] Referring Provider:  [name], MD  Encounter Date: 10/28/2014      PT End of Session - 10/28/14 0826    Visit Number 20   Number of Visits 25   Date for PT Re-Evaluation 11/11/14   Authorization Type [employer]   PT Start Time 0815  pt late due to traffic   PT Stop Time 0842   PT Time Calculation (min) 27 min   Equipment Utilized During Treatment Gait belt   Activity Tolerance Patient tolerated treatment well   Behavior During Therapy [facility] for tasks assessed/performed      Past Medical History  Diagnosis Date  . Open wound of both legs with complication     Pt has had progressive wounds of both LE's including gangrene of the toes and patchy necrosis of the calves.  He has been treated at [facility] by Dr Jerline Pain with hyperbaric O2 5d per week.  He is getting Na Thiosulfate with HD for suspected calciphylaxis. Pt says doctor's aren't sure if these ulcers were diabetic ulcers or calciphylaxis.  He underwent bilat BKA on 07/30/13.  He says that the woun  . ESRD on hemodialysis     Pt has ESRD due to DM.  He had a L upper arm AVF prior to starting HD which was ligated due to L arm steal syndrome.  He started HD in Jan 2014 and did home HD.  The family couldn't cannulate the R arm AVF successfully so by mid 2014 they decided to switch to PD which was done late summer 2014.  In Jan 2015 he was admitted with FTT felt to be due to underdialysis on PD and PD was abandoned and he   . Anemia     esrd  . Hypertension     off of meds due to orthostatic hypotension  . Closed left arm fracture 1967  . Heart murmur   . Corneal abrasion, left   . GERD (gastroesophageal reflux disease)   . History of blood transfusion   .  Glomerulosclerosis, diabetic   . Depression   . A-fib   . Calciphylaxis 05/2013    lower ext  . Diabetes mellitus     Type 1 iddm x 11 yrs  . Diabetic neuropathy   . Cancer     hx duodenal adenoCa 2002, resected    Past Surgical History  Procedure Laterality Date  . Whipple procedure  2002    duodenal ca, Dr. Harlow Asa  . Bone marrow biopsy  2012  . Renal biopsy, percutaneous  2012  . Tonsillectomy    . Portacath placement  2002  . Biliary diversion, external  2002  . Other surgical history      Cyst removed from back  . Av fistula placement  06/07/2011    Procedure: ARTERIOVENOUS (AV) FISTULA CREATION;  Surgeon: Angelia Mould, MD;  Location: [place];  Service: Vascular;  Laterality: Left;  . Ligation of arteriovenous  fistula  05/22/2012    Procedure: LIGATION OF ARTERIOVENOUS  FISTULA;  Surgeon: Mal Misty, MD;  Location: [facility];  Service: Vascular;  Laterality: Left;  Left brachio-cephalic fistula  . Insertion of dialysis catheter  05/22/2012    Procedure: INSERTION OF DIALYSIS CATHETER;  Surgeon: Mal Misty, MD;  [facility];  Service: Vascular;  Laterality: N/A;  right internal jugular vein  . Av fistula placement  06/18/2012    Procedure: ARTERIOVENOUS (AV) FISTULA CREATION;  Surgeon: Mal Misty, MD;  Location: [facility];  Service: Vascular;  Laterality: Right;  [place]  . Colonoscopy      Hx: of  . Capd insertion N/A 01/14/2013    Procedure: LAPAROSCOPIC INSERTION CONTINUOUS AMBULATORY PERITONEAL DIALYSIS  (CAPD) CATHETER;  Surgeon: Adin Hector, MD;  Location: [facility];  Service: General;  Laterality: N/A;  . Vascular surgery    . Removal of a dialysis catheter    . Capd removal N/A 07/09/2013    Procedure: CONTINUOUS AMBULATORY PERITONEAL DIALYSIS  (CAPD) CATHETER REMOVAL;  Surgeon: Adin Hector, MD;  Location: [facility];  Service: General;  Laterality: N/A;  . Amputation Bilateral 07/30/2013    Procedure: AMPUTATION BELOW KNEE;  Surgeon: Newt Minion, MD;   Location: [facility];  Service: Orthopedics;  Laterality: Bilateral;  Bilateral Below Knee Amputations  . Amputation Bilateral 09/03/2013    Procedure: AMPUTATION BELOW KNEE;  Surgeon: Newt Minion, MD;  Location: [facility];  Service: Orthopedics;  Laterality: Bilateral;  Bilateral Below Knee Amputation Revision  . Lower extremity angiogram N/A 06/14/2013    Procedure: LOWER EXTREMITY ANGIOGRAM;  Surgeon: Angelia Mould, MD;  Location: [facility] CATH LAB;  Service: Cardiovascular;  Laterality: N/A;  . Nephrectomy transplanted organ      There were no vitals filed for this visit.  Visit Diagnosis:  Abnormality of gait  Impaired functional mobility and activity tolerance  Weakness generalized      Subjective Assessment - 10/28/14 0825    Subjective No new complaints. No pain or falls to report.   Currently in Pain? No/denies           [facility] Adult PT Treatment/Exercise - 10/28/14 0826    Transfers   Sit to Stand 6: Modified independent (Device/Increase time);Without upper extremity assist;From chair/3-in-1   Stand to Sit 6: Modified independent (Device/Increase time);Without upper extremity assist;To chair/3-in-1   Ambulation/Gait   Ambulation/Gait Yes   Ambulation/Gait Assistance 4: Min guard;4: Min assist   Ambulation/Gait Assistance Details cues on posture and base of support; increased assist needed on outdoor complaint surfaces.   Ambulation Distance (Feet) 700 Feet  or more   Assistive device Prostheses;Straight cane   Gait Pattern Step-through pattern;Decreased stride length   Ambulation Surface Level;Unlevel;Indoor;Outdoor;Paved;Gravel;Grass   Stairs Yes   Stairs Assistance 4: Min guard;5: Supervision   Stairs Assistance Details (indicate cue type and reason) cues to bring toes off stair edge more with descending stairs.   Stair Management Technique One rail Right;One rail Left;With cane;Alternating pattern;Forwards   Number of Stairs 4  x 3 reps   Ramp 4: Min assist  with  cane/prostheses   Ramp Details (indicate cue type and reason) x 5 reps, cues on technique/sequence   Curb 4: Min assist  with cane/prostheses   Curb Details (indicate cue type and reason) x 5 reps, cues on technique/sequence   Prosthetics   Current prosthetic wear tolerance (days/week)  7 days /wk   Current prosthetic wear tolerance (#hours/day)  all awake hours   Donning Prosthesis Independent   Doffing Prosthesis Independent            PT Short Term Goals - 09/15/14 0806    PT SHORT TERM GOAL #1   Title Pt will be able to: Ambulate 500 ft. with forearm crutches and prostheses with supervision. (Target  Date: 09/09/2014)   Baseline MET 09/13/14   Status Achieved   PT SHORT TERM GOAL #2   Title Patient will be able to ambulate 56' around furniture with 1 crutch & prostheses with supervision.  (Target Date: 09/09/2014)   Baseline MET 09/13/14   Status Achieved   PT SHORT TERM GOAL #3   Title Pt will be at to:  Perform standing activities for >15 minutes with prostheses with minimal c/o fatigue.  (Target Date: 09/09/2014)   Baseline MET 09/13/14   Status Achieved   PT SHORT TERM GOAL #4   Title Pt will be able to reach 10" & within 5" of floor with support of 1 crutch with supervision. (Target Date: 09/09/2014)   Baseline MET 09/13/14   Time 1   Period Months   Status Achieved   PT SHORT TERM GOAL #5   Title Pt will be able to: negotiate ramp & curb with forearm crutches & prostheses with supervision.  (Target Date: 09/09/2014)   Baseline NOT MET 09/13/14 but progressing   Status Not Met           PT Long Term Goals - 10/25/14 0109    PT LONG TERM GOAL #1   Title Pt will be able to: Ambulate 884f with one crutch & prostheses modified independent. (NEW Target Date: 11/11/2014)   Baseline 10/13/2014 Partially MET Patient is independent with 2 crutches as LRAD but his current goal for community is one crutch which is realistic   Time 4   Period Weeks   Status On-going   PT LONG  TERM GOAL #2   Title Pt will be able to: ambulate 200' on uneven (grass) surfaces with LRAD & prostheses modified independent.  (NEW Target Date: 11/11/2014)   Baseline 10/13/2014 Partially MET Patient is independent with 2 crutches as LRAD but his current goal for community is one crutch which is realistic   Time 4   Period Weeks   Status On-going   PT LONG TERM GOAL #3   Title Pt will be able to: negotiate ramp, curb, stairs with LRAD & prostheses modified independent. (NEW Target Date: 11/11/2014)   Baseline 10/13/2014 Partially MET Patient is independent with 2 crutches as LRAD on ramp, curb & rails on stairs but his current goal for community is one crutch which is realistic   Time 4   Period Weeks   Status On-going   PT LONG TERM GOAL #4   Title Pt will be able to: perform standing activities for > 20 minutes & prostheses with no pain or discomfort.  (Target Date: 10/14/2014)   Baseline MET 10/13/2014   Time 12   Period Weeks   Status Achieved   PT LONG TERM GOAL #5   Title Berg Balance >/= 45/56 (NEW Target Date: 11/11/2014)   Baseline Progressed to Berg Score of 44/56   Time 4   Period Weeks   Status On-going   PT LONG TERM GOAL #6   Title Patient will ambulate in home simulated (100' and single step) with 1 crutch or less restrictive device modified independent. (NEW Target Date: 11/10/2104)   Baseline 10/13/2014 Partially MET Patient ambulates 100' with 1 crutch safely but needs 2nd crutch or assist to negotiate single step.   Time 4   Period Weeks   Status On-going           Plan - 10/28/14 0826    Clinical Impression Statement Pt continues to make steady progress toward his goals. Has purchased  a straight cane and rubber tip for home use.    Pt will benefit from skilled therapeutic intervention in order to improve on the following deficits Abnormal gait;Decreased activity tolerance;Decreased balance;Decreased endurance;Decreased mobility;Decreased strength;Prosthetic  Dependency   Rehab Potential Good   PT Frequency 2x / week   PT Duration 4 weeks   PT Treatment/Interventions ADLs/Self Care Home Management;Gait training;Stair training;Functional mobility training;Therapeutic activities;Therapeutic exercise;Balance training;Neuromuscular re-education;Patient/family education   PT Next Visit Plan Continue towards LTGs, instruct in well-rounded fitness plan   Consulted and Agree with Plan of Care Patient;Family member/caregiver   Family Member Consulted wife        Problem List Patient Active Problem List   Diagnosis Date Noted  . Abnormal cardiovascular stress test 12/29/2013  . Awaiting organ transplant 12/09/2013  . Leg ulcer 11/17/2013  . Chronic kidney disease (CKD), stage V 11/17/2013  . Diabetes 11/17/2013  . Cancer of duodenum 11/17/2013  . Dyslipidemia 11/17/2013  . Chronic kidney disease requiring chronic dialysis 11/17/2013  . Hypercholesteremia 11/17/2013  . H/O malignant neoplasm 11/17/2013  . H/O: HTN (hypertension) 11/17/2013  . BP (high blood pressure) 11/17/2013  . Hypotension 11/17/2013  . Angiopathy, peripheral 11/17/2013  . AF (paroxysmal atrial fibrillation) 11/17/2013  . BKA stump complication 62/86/3817  . S/P bilateral BKA (below knee amputation) 08/03/2013  . S/P BKA (below knee amputation) bilateral 07/30/2013  . Open wound of both legs with complication 71/16/5790  . Acute blood loss anemia 07/12/2013  . Syncope 07/09/2013  . Intolerance to CAPD peritoneal dialysis s/p CAPD cath removal 07/09/2013 07/05/2013  . Critical lower limb ischemia 06/30/2013  . Extremity pain 06/30/2013  . Atherosclerosis of native arteries of the extremities with ulceration(440.23) 06/17/2013  . Gait disorder 05/31/2013  . Weakness 05/24/2013  . Depression 04/03/2013  . GERD (gastroesophageal reflux disease) 04/03/2013  . Atrial fibrillation 04/02/2013  . DM (diabetes mellitus) 12/28/2012  . Other complications due to renal dialysis  device, implant, and graft 05/19/2012  . Diabetes mellitus, type 2 11/13/2011  . Anemia 07/02/2011  . ESRD on hemodialysis 05/29/2011    Willow Ora 10/28/2014, 1:32 PM  Willow Ora, PTA, [facility] [address] [place], [place] [address] [phone] 10/28/2014, 1:32 PM

## 2014-11-01 ENCOUNTER — Ambulatory Visit: Payer: Managed Care, Other (non HMO) | Admitting: Physical Therapy

## 2014-11-01 ENCOUNTER — Encounter: Payer: Self-pay | Admitting: Physical Therapy

## 2014-11-01 DIAGNOSIS — R2689 Other abnormalities of gait and mobility: Secondary | ICD-10-CM

## 2014-11-01 DIAGNOSIS — R269 Unspecified abnormalities of gait and mobility: Secondary | ICD-10-CM | POA: Diagnosis not present

## 2014-11-01 DIAGNOSIS — R531 Weakness: Secondary | ICD-10-CM

## 2014-11-01 DIAGNOSIS — Z7409 Other reduced mobility: Secondary | ICD-10-CM

## 2014-11-02 NOTE — Therapy (Signed)
[facility] [address] [place], [place], [address] Phone: [phone]   Fax:  [phone]  Physical Therapy Treatment  Patient Details  Name: Paul [name] MRN: [id number] Date of Birth: 1956-02-13 Referring Provider:  [name], MD  Encounter Date: 11/01/2014   11/01/14 0806  PT Visits / Re-Eval  Visit Number 21  Number of Visits 25  Date for PT Re-Evaluation 11/11/14  Authorization  Authorization Type [employer]  PT Time Calculation  PT Start Time 0802  PT Stop Time 0845  PT Time Calculation (min) 43 min  PT - End of Session  Equipment Utilized During Treatment Gait belt  Activity Tolerance Patient tolerated treatment well  Behavior During Therapy [facility] for tasks assessed/performed      Past Medical History  Diagnosis Date  . Open wound of both legs with complication     Pt has had progressive wounds of both LE's including gangrene of the toes and patchy necrosis of the calves.  He has been treated at [facility] by Dr Jerline Pain with hyperbaric O2 5d per week.  He is getting Na Thiosulfate with HD for suspected calciphylaxis. Pt says doctor's aren't sure if these ulcers were diabetic ulcers or calciphylaxis.  He underwent bilat BKA on 07/30/13.  He says that the woun  . ESRD on hemodialysis     Pt has ESRD due to DM.  He had a L upper arm AVF prior to starting HD which was ligated due to L arm steal syndrome.  He started HD in Jan 2014 and did home HD.  The family couldn't cannulate the R arm AVF successfully so by mid 2014 they decided to switch to PD which was done late summer 2014.  In Jan 2015 he was admitted with FTT felt to be due to underdialysis on PD and PD was abandoned and he   . Anemia     esrd  . Hypertension     off of meds due to orthostatic hypotension  . Closed left arm fracture 1967  . Heart murmur   . Corneal abrasion, left   . GERD (gastroesophageal reflux disease)   . History of blood  transfusion   . Glomerulosclerosis, diabetic   . Depression   . A-fib   . Calciphylaxis 05/2013    lower ext  . Diabetes mellitus     Type 1 iddm x 11 yrs  . Diabetic neuropathy   . Cancer     hx duodenal adenoCa 2002, resected    Past Surgical History  Procedure Laterality Date  . Whipple procedure  2002    duodenal ca, Dr. Harlow Asa  . Bone marrow biopsy  2012  . Renal biopsy, percutaneous  2012  . Tonsillectomy    . Portacath placement  2002  . Biliary diversion, external  2002  . Other surgical history      Cyst removed from back  . Av fistula placement  06/07/2011    Procedure: ARTERIOVENOUS (AV) FISTULA CREATION;  Surgeon: Angelia Mould, MD;  Location: [place];  Service: Vascular;  Laterality: Left;  . Ligation of arteriovenous  fistula  05/22/2012    Procedure: LIGATION OF ARTERIOVENOUS  FISTULA;  Surgeon: Mal Misty, MD;  Location: [facility];  Service: Vascular;  Laterality: Left;  Left brachio-cephalic fistula  . Insertion of dialysis catheter  05/22/2012    Procedure: INSERTION OF DIALYSIS CATHETER;  Surgeon: Mal Misty, MD;  Location: [facility];  Service: Vascular;  Laterality: N/A;  right internal jugular vein  . Av fistula placement  06/18/2012    Procedure: ARTERIOVENOUS (AV) FISTULA CREATION;  Surgeon: Mal Misty, MD;  Location: [facility];  Service: Vascular;  Laterality: Right;  [place]  . Colonoscopy      Hx: of  . Capd insertion N/A 01/14/2013    Procedure: LAPAROSCOPIC INSERTION CONTINUOUS AMBULATORY PERITONEAL DIALYSIS  (CAPD) CATHETER;  Surgeon: Adin Hector, MD;  Location: [place];  Service: General;  Laterality: N/A;  . Vascular surgery    . Removal of a dialysis catheter    . Capd removal N/A 07/09/2013    Procedure: CONTINUOUS AMBULATORY PERITONEAL DIALYSIS  (CAPD) CATHETER REMOVAL;  Surgeon: Adin Hector, MD;  Location: [place];  Service: General;  Laterality: N/A;  . Amputation Bilateral 07/30/2013    Procedure: AMPUTATION BELOW KNEE;  Surgeon: Newt Minion, MD;  Location: [facility];  Service: Orthopedics;  Laterality: Bilateral;  Bilateral Below Knee Amputations  . Amputation Bilateral 09/03/2013    Procedure: AMPUTATION BELOW KNEE;  Surgeon: Newt Minion, MD;  Location: [facility];  Service: Orthopedics;  Laterality: Bilateral;  Bilateral Below Knee Amputation Revision  . Lower extremity angiogram N/A 06/14/2013    Procedure: LOWER EXTREMITY ANGIOGRAM;  Surgeon: Angelia Mould, MD;  Location: [facility] CATH LAB;  Service: Cardiovascular;  Laterality: N/A;  . Nephrectomy transplanted organ      There were no vitals filed for this visit.  Visit Diagnosis:  Abnormality of gait  Impaired functional mobility and activity tolerance  Weakness generalized  Balance problems     11/01/14 0805  Symptoms/Limitations  Subjective No new complaints. No pain or falls to report. Has 2 canes, one stays at work and uses other one at home.  Pain Assessment  Currently in Pain? No/denies      11/01/14 0806  Transfers  Sit to Stand 6: Modified independent (Device/Increase time);Without upper extremity assist;From chair/3-in-1  Stand to Sit 6: Modified independent (Device/Increase time);Without upper extremity assist;To chair/3-in-1  Ambulation/Gait  Ambulation/Gait Yes  Ambulation/Gait Assistance 5: Supervision  Ambulation/Gait Assistance Details occasional cues on base of support  Ambulation Distance (Feet) 800 Feet (x1)  Assistive device Prostheses;Straight cane (tripod tip)  Gait Pattern Step-through pattern;Decreased stride length  Ambulation Surface Level;Indoor  Ramp Other (comment) (min guard assist)  Ramp Details (indicate cue type and reason) x 5 reps with cane/prostheses, cues on sequence/technique  Curb 4: Min assist  Curb Details (indicate cue type and reason) x 5 reps, min HHA opposite cane for balance.  Dynamic Standing Balance  Dynamic Standing - Balance Support No upper extremity supported;During functional activity  Dynamic  Standing - Level of Assistance 4: Min assist  Dynamic Standing - Balance Activities Rocker board;Eyes open;Eyes closed;Head turns;Head nods  Dynamic Standing - Comments performed both ways on board: eyes closed no head movements, eyes open head turns/nods. cues on posture and weight shift with each activity  High Level Balance  High Level Balance Activities Marching forwards  High Level Balance Comments in parallel bars without UE support x 6 laps, trying for 3-4 second hold/pauses with each march for single leg stance balance challenge   Knee/Hip Exercises: Aerobic  Stationary Bike Scifit legs only level 4.5 x 8 mintues with goal rpm >/= 45 for strengthening and activity tolerance.  Prosthetics  Current prosthetic wear tolerance (days/week)  7 days /wk  Current prosthetic wear tolerance (#hours/day)  all awake hours  Donning Prosthesis 7  Doffing Prosthesis 7  PT Short Term Goals - 09/15/14 0806    PT SHORT TERM GOAL #1   Title Pt will be able to: Ambulate 500 ft. with forearm crutches and prostheses with supervision. (Target Date: 09/09/2014)   Baseline MET 09/13/14   Status Achieved   PT SHORT TERM GOAL #2   Title Patient will be able to ambulate 61' around furniture with 1 crutch & prostheses with supervision.  (Target Date: 09/09/2014)   Baseline MET 09/13/14   Status Achieved   PT SHORT TERM GOAL #3   Title Pt will be at to:  Perform standing activities for >15 minutes with prostheses with minimal c/o fatigue.  (Target Date: 09/09/2014)   Baseline MET 09/13/14   Status Achieved   PT SHORT TERM GOAL #4   Title Pt will be able to reach 10" & within 5" of floor with support of 1 crutch with supervision. (Target Date: 09/09/2014)   Baseline MET 09/13/14   Time 1   Period Months   Status Achieved   PT SHORT TERM GOAL #5   Title Pt will be able to: negotiate ramp & curb with forearm crutches & prostheses with supervision.  (Target Date: 09/09/2014)   Baseline NOT MET 09/13/14  but progressing   Status Not Met           PT Long Term Goals - 10/25/14 2536    PT LONG TERM GOAL #1   Title Pt will be able to: Ambulate 861f with one crutch & prostheses modified independent. (NEW Target Date: 11/11/2014)   Baseline 10/13/2014 Partially MET Patient is independent with 2 crutches as LRAD but his current goal for community is one crutch which is realistic   Time 4   Period Weeks   Status On-going   PT LONG TERM GOAL #2   Title Pt will be able to: ambulate 200' on uneven (grass) surfaces with LRAD & prostheses modified independent.  (NEW Target Date: 11/11/2014)   Baseline 10/13/2014 Partially MET Patient is independent with 2 crutches as LRAD but his current goal for community is one crutch which is realistic   Time 4   Period Weeks   Status On-going   PT LONG TERM GOAL #3   Title Pt will be able to: negotiate ramp, curb, stairs with LRAD & prostheses modified independent. (NEW Target Date: 11/11/2014)   Baseline 10/13/2014 Partially MET Patient is independent with 2 crutches as LRAD on ramp, curb & rails on stairs but his current goal for community is one crutch which is realistic   Time 4   Period Weeks   Status On-going   PT LONG TERM GOAL #4   Title Pt will be able to: perform standing activities for > 20 minutes & prostheses with no pain or discomfort.  (Target Date: 10/14/2014)   Baseline MET 10/13/2014   Time 12   Period Weeks   Status Achieved   PT LONG TERM GOAL #5   Title Berg Balance >/= 45/56 (NEW Target Date: 11/11/2014)   Baseline Progressed to Berg Score of 44/56   Time 4   Period Weeks   Status On-going   PT LONG TERM GOAL #6   Title Patient will ambulate in home simulated (100' and single step) with 1 crutch or less restrictive device modified independent. (NEW Target Date: 11/10/2104)   Baseline 10/13/2014 Partially MET Patient ambulates 100' with 1 crutch safely but needs 2nd crutch or assist to negotiate single step.   Time 4   Period Weeks  Status On-going        11/01/14 0806  Plan  Clinical Impression Statement Pt progressing well towards his goals. Using cane more vs crutches. Focued on balance and strengthening today with balance on complaint surfaces still a challenge.  Pt will benefit from skilled therapeutic intervention in order to improve on the following deficits Abnormal gait;Decreased activity tolerance;Decreased balance;Decreased endurance;Decreased mobility;Decreased strength;Prosthetic Dependency  Rehab Potential Good  PT Frequency 2x / week  PT Duration 4 weeks  PT Treatment/Interventions ADLs/Self Care Home Management;Gait training;Stair training;Functional mobility training;Therapeutic activities;Therapeutic exercise;Balance training;Neuromuscular re-education;Patient/family education  PT Next Visit Plan Continue towards LTGs, instruct in well-rounded fitness plan  Consulted and Agree with Plan of Care Patient;Family member/caregiver  Family Member Consulted wife    Problem List Patient Active Problem List   Diagnosis Date Noted  . Abnormal cardiovascular stress test 12/29/2013  . Awaiting organ transplant 12/09/2013  . Leg ulcer 11/17/2013  . Chronic kidney disease (CKD), stage V 11/17/2013  . Diabetes 11/17/2013  . Cancer of duodenum 11/17/2013  . Dyslipidemia 11/17/2013  . Chronic kidney disease requiring chronic dialysis 11/17/2013  . Hypercholesteremia 11/17/2013  . H/O malignant neoplasm 11/17/2013  . H/O: HTN (hypertension) 11/17/2013  . BP (high blood pressure) 11/17/2013  . Hypotension 11/17/2013  . Angiopathy, peripheral 11/17/2013  . AF (paroxysmal atrial fibrillation) 11/17/2013  . BKA stump complication 20/19/9241  . S/P bilateral BKA (below knee amputation) 08/03/2013  . S/P BKA (below knee amputation) bilateral 07/30/2013  . Open wound of both legs with complication 55/16/1443  . Acute blood loss anemia 07/12/2013  . Syncope 07/09/2013  . Intolerance to CAPD peritoneal dialysis  s/p CAPD cath removal 07/09/2013 07/05/2013  . Critical lower limb ischemia 06/30/2013  . Extremity pain 06/30/2013  . Atherosclerosis of native arteries of the extremities with ulceration(440.23) 06/17/2013  . Gait disorder 05/31/2013  . Weakness 05/24/2013  . Depression 04/03/2013  . GERD (gastroesophageal reflux disease) 04/03/2013  . Atrial fibrillation 04/02/2013  . DM (diabetes mellitus) 12/28/2012  . Other complications due to renal dialysis device, implant, and graft 05/19/2012  . Diabetes mellitus, type 2 11/13/2011  . Anemia 07/02/2011  . ESRD on hemodialysis 05/29/2011    Willow Ora 11/02/2014, 6:16 PM  Willow Ora, PTA, [facility] [address], [place], [place] [address] [phone] 11/02/2014, 6:17 PM

## 2014-11-04 ENCOUNTER — Encounter: Payer: Self-pay | Admitting: Physical Therapy

## 2014-11-04 ENCOUNTER — Ambulatory Visit: Payer: Managed Care, Other (non HMO) | Admitting: Physical Therapy

## 2014-11-04 DIAGNOSIS — R269 Unspecified abnormalities of gait and mobility: Secondary | ICD-10-CM

## 2014-11-04 DIAGNOSIS — Z89511 Acquired absence of right leg below knee: Secondary | ICD-10-CM

## 2014-11-04 DIAGNOSIS — R2689 Other abnormalities of gait and mobility: Secondary | ICD-10-CM

## 2014-11-04 DIAGNOSIS — Z7409 Other reduced mobility: Secondary | ICD-10-CM

## 2014-11-04 DIAGNOSIS — R531 Weakness: Secondary | ICD-10-CM

## 2014-11-04 DIAGNOSIS — Z89512 Acquired absence of left leg below knee: Secondary | ICD-10-CM

## 2014-11-04 NOTE — Therapy (Signed)
[facility] [address] [place], [place], [address] Phone: [phone]   Fax:  [phone]  Physical Therapy Treatment  Patient Details  Name: Paul [name] MRN: [id number] Date of Birth: Nov 01, 1955 Referring Provider:  [name], MD  Encounter Date: 11/04/2014      PT End of Session - 11/04/14 0800    Visit Number 21   Number of Visits 25   Date for PT Re-Evaluation 11/11/14   Authorization Type [employer]   PT Start Time 0800   PT Stop Time 0846   PT Time Calculation (min) 46 min   Activity Tolerance Patient tolerated treatment well   Behavior During Therapy [facility] for tasks assessed/performed      Past Medical History  Diagnosis Date  . Open wound of both legs with complication     Pt has had progressive wounds of both LE's including gangrene of the toes and patchy necrosis of the calves.  He has been treated at [facility] by Dr Jerline Pain with hyperbaric O2 5d per week.  He is getting Na Thiosulfate with HD for suspected calciphylaxis. Pt says doctor's aren't sure if these ulcers were diabetic ulcers or calciphylaxis.  He underwent bilat BKA on 07/30/13.  He says that the woun  . ESRD on hemodialysis     Pt has ESRD due to DM.  He had a L upper arm AVF prior to starting HD which was ligated due to L arm steal syndrome.  He started HD in Jan 2014 and did home HD.  The family couldn't cannulate the R arm AVF successfully so by mid 2014 they decided to switch to PD which was done late summer 2014.  In Jan 2015 he was admitted with FTT felt to be due to underdialysis on PD and PD was abandoned and he   . Anemia     esrd  . Hypertension     off of meds due to orthostatic hypotension  . Closed left arm fracture 1967  . Heart murmur   . Corneal abrasion, left   . GERD (gastroesophageal reflux disease)   . History of blood transfusion   . Glomerulosclerosis, diabetic   . Depression   . A-fib   . Calciphylaxis 05/2013     lower ext  . Diabetes mellitus     Type 1 iddm x 11 yrs  . Diabetic neuropathy   . Cancer     hx duodenal adenoCa 2002, resected    Past Surgical History  Procedure Laterality Date  . Whipple procedure  2002    duodenal ca, Dr. Harlow Asa  . Bone marrow biopsy  2012  . Renal biopsy, percutaneous  2012  . Tonsillectomy    . Portacath placement  2002  . Biliary diversion, external  2002  . Other surgical history      Cyst removed from back  . Av fistula placement  06/07/2011    Procedure: ARTERIOVENOUS (AV) FISTULA CREATION;  Surgeon: Angelia Mould, MD;  Location: [place];  Service: Vascular;  Laterality: Left;  . Ligation of arteriovenous  fistula  05/22/2012    Procedure: LIGATION OF ARTERIOVENOUS  FISTULA;  Surgeon: Mal Misty, MD;  Location: [place];  Service: Vascular;  Laterality: Left;  Left brachio-cephalic fistula  . Insertion of dialysis catheter  05/22/2012    Procedure: INSERTION OF DIALYSIS CATHETER;  Surgeon: Mal Misty, MD;  Location: [place];  Service: Vascular;  Laterality: N/A;  right internal jugular vein  .  Av fistula placement  06/18/2012    Procedure: ARTERIOVENOUS (AV) FISTULA CREATION;  Surgeon: Mal Misty, MD;  Location: [place];  Service: Vascular;  Laterality: Right;  [place]  . Colonoscopy      Hx: of  . Capd insertion N/A 01/14/2013    Procedure: LAPAROSCOPIC INSERTION CONTINUOUS AMBULATORY PERITONEAL DIALYSIS  (CAPD) CATHETER;  Surgeon: Adin Hector, MD;  Location: [place];  Service: General;  Laterality: N/A;  . Vascular surgery    . Removal of a dialysis catheter    . Capd removal N/A 07/09/2013    Procedure: CONTINUOUS AMBULATORY PERITONEAL DIALYSIS  (CAPD) CATHETER REMOVAL;  Surgeon: Adin Hector, MD;  Location: [facility];  Service: General;  Laterality: N/A;  . Amputation Bilateral 07/30/2013    Procedure: AMPUTATION BELOW KNEE;  Surgeon: Newt Minion, MD;  Location: [facility];  Service: Orthopedics;  Laterality: Bilateral;  Bilateral Below Knee  Amputations  . Amputation Bilateral 09/03/2013    Procedure: AMPUTATION BELOW KNEE;  Surgeon: Newt Minion, MD;  Location: [facility];  Service: Orthopedics;  Laterality: Bilateral;  Bilateral Below Knee Amputation Revision  . Lower extremity angiogram N/A 06/14/2013    Procedure: LOWER EXTREMITY ANGIOGRAM;  Surgeon: Angelia Mould, MD;  Location[facility] C[facility];  Service: Cardiovascular;  Laterality: N/A;  . Nephrectomy transplanted organ      There were no vitals filed for this visit.  Visit Diagnosis:  Abnormality of gait  Impaired functional mobility and activity tolerance  Weakness generalized  Balance problems  S/P bilateral BKA (below knee amputation)      Subjective Assessment - 11/04/14 0802    Subjective No falls. Walk around office & house with cane, limited community based on terrain with one crutch otherwise he uses 2 crutches for unknown or difficult terrain.   Currently in Pain? No/denies                 Therapeutic Activities: PT instructed & demo floor transfer using horizontal surface to push up. Patient return demo understanding with minA /CGA. Therapeutic Exercise: See pt instructions & pt education.        [facility] Adult PT Treatment/Exercise - 11/04/14 0800    Transfers   Transfers --  Floor Transfer pushing on mat table with minA   Sit to Stand 6: Modified independent (Device/Increase time);Without upper extremity assist;From chair/3-in-1;From elevated surface   Stand to Sit 6: Modified independent (Device/Increase time);Without upper extremity assist;To chair/3-in-1   Ambulation/Gait   Ambulation/Gait Yes   Ambulation/Gait Assistance 5: Supervision;4: Min guard  SBA with cane, MinGuard occasional without device   Ambulation Distance (Feet) 800 Feet  x1 with cane, 100' & 40' around furniture without device   Assistive device Prostheses;Straight cane;None  tripod tip cane & no device for household/office   Gait Pattern Step-through  pattern;Decreased stride length   Ambulation Surface Indoor;Level   Stairs Yes   Stairs Assistance 5: Supervision;4: Min guard  need min Guard 2x with cane in [place] Details (indicate cue type and reason) demo, verbal cues on using heel placement for foot position,    Stair Management Technique One rail Right;One rail Left;Two rails;Alternating pattern;Forwards;With cane  2 rails, 1 rail & cane changing sides of placement   Number of Stairs 4  7 reps   Ramp Other (comment)  min guard assist   Curb 4: Min assist   Dynamic Standing Balance   Dynamic Standing - Balance Support --   Dynamic Standing -  Level of Assistance --   Dynamic Standing - Balance Activities --   High Level Balance   High Level Balance Activities Marching forwards;Backward walking;Side stepping   High Level Balance Comments with tripod tip cane and Light UE support  PT instructed pt to perform at home near counter top   Knee/Hip Exercises: Aerobic   Stationary Bike Scifit legs only level 4.5 x 8 mintues with goal rpm >/= 45 for strengthening and activity tolerance.   Prosthetics   Current prosthetic wear tolerance (days/week)  7 days /wk   Current prosthetic wear tolerance (#hours/day)  all awake hours                PT Education - 11/04/14 0800    Education provided Yes   Education Details ongoing fitness plan to continue to improve activity tolerance including strength, flexibility, balance & endurance   Person(s) Educated Patient;Spouse   Methods Explanation;Demonstration;Verbal cues;Handout   Comprehension Verbalized understanding;Returned demonstration;Verbal cues required          PT Short Term Goals - 09/15/14 0806    PT SHORT TERM GOAL #1   Title Pt will be able to: Ambulate 500 ft. with forearm crutches and prostheses with supervision. (Target Date: 09/09/2014)   Baseline MET 09/13/14   Status Achieved   PT SHORT TERM GOAL #2   Title Patient will be able to ambulate 75'  around furniture with 1 crutch & prostheses with supervision.  (Target Date: 09/09/2014)   Baseline MET 09/13/14   Status Achieved   PT SHORT TERM GOAL #3   Title Pt will be at to:  Perform standing activities for >15 minutes with prostheses with minimal c/o fatigue.  (Target Date: 09/09/2014)   Baseline MET 09/13/14   Status Achieved   PT SHORT TERM GOAL #4   Title Pt will be able to reach 10" & within 5" of floor with support of 1 crutch with supervision. (Target Date: 09/09/2014)   Baseline MET 09/13/14   Time 1   Period Months   Status Achieved   PT SHORT TERM GOAL #5   Title Pt will be able to: negotiate ramp & curb with forearm crutches & prostheses with supervision.  (Target Date: 09/09/2014)   Baseline NOT MET 09/13/14 but progressing   Status Not Met           PT Long Term Goals - 10/25/14 9449    PT LONG TERM GOAL #1   Title Pt will be able to: Ambulate 893ft with one crutch & prostheses modified independent. (NEW Target Date: 11/11/2014)   Baseline 10/13/2014 Partially MET Patient is independent with 2 crutches as LRAD but his current goal for community is one crutch which is realistic   Time 4   Period Weeks   Status On-going   PT LONG TERM GOAL #2   Title Pt will be able to: ambulate 200' on uneven (grass) surfaces with LRAD & prostheses modified independent.  (NEW Target Date: 11/11/2014)   Baseline 10/13/2014 Partially MET Patient is independent with 2 crutches as LRAD but his current goal for community is one crutch which is realistic   Time 4   Period Weeks   Status On-going   PT LONG TERM GOAL #3   Title Pt will be able to: negotiate ramp, curb, stairs with LRAD & prostheses modified independent. (NEW Target Date: 11/11/2014)   Baseline 10/13/2014 Partially MET Patient is independent with 2 crutches as LRAD on ramp, curb & rails on stairs but his  current goal for community is one crutch which is realistic   Time 4   Period Weeks   Status On-going   PT LONG TERM GOAL #4    Title Pt will be able to: perform standing activities for > 20 minutes & prostheses with no pain or discomfort.  (Target Date: 10/14/2014)   Baseline MET 10/13/2014   Time 12   Period Weeks   Status Achieved   PT LONG TERM GOAL #5   Title Berg Balance >/= 45/56 (NEW Target Date: 11/11/2014)   Baseline Progressed to Berg Score of 44/56   Time 4   Period Weeks   Status On-going   PT LONG TERM GOAL #6   Title Patient will ambulate in home simulated (100' and single step) with 1 crutch or less restrictive device modified independent. (NEW Target Date: 11/10/2104)   Baseline 10/13/2014 Partially MET Patient ambulates 100' with 1 crutch safely but needs 2nd crutch or assist to negotiate single step.   Time 4   Period Weeks   Status On-going               Plan - 11/04/14 0800    Clinical Impression Statement Patient appears on target for discharge next week. He is functioning with varied use of assistive devices depending on activities. See subjective comment. Patient & wife appear to understand importance of ongoing fitness plan.   Pt will benefit from skilled therapeutic intervention in order to improve on the following deficits Abnormal gait;Decreased activity tolerance;Decreased balance;Decreased endurance;Decreased mobility;Decreased strength;Prosthetic Dependency   Rehab Potential Good   PT Frequency 2x / week   PT Duration 4 weeks   PT Treatment/Interventions ADLs/Self Care Home Management;Gait training;Stair training;Functional mobility training;Therapeutic activities;Therapeutic exercise;Balance training;Neuromuscular re-education;Patient/family education   PT Next Visit Plan Assess LTGs for discharge,   Consulted and Agree with Plan of Care Patient;Family member/caregiver   Family Member Consulted wife        Problem List Patient Active Problem List   Diagnosis Date Noted  . Abnormal cardiovascular stress test 12/29/2013  . Awaiting organ transplant 12/09/2013  . Leg  ulcer 11/17/2013  . Chronic kidney disease (CKD), stage V 11/17/2013  . Diabetes 11/17/2013  . Cancer of duodenum 11/17/2013  . Dyslipidemia 11/17/2013  . Chronic kidney disease requiring chronic dialysis 11/17/2013  . Hypercholesteremia 11/17/2013  . H/O malignant neoplasm 11/17/2013  . H/O: HTN (hypertension) 11/17/2013  . BP (high blood pressure) 11/17/2013  . Hypotension 11/17/2013  . Angiopathy, peripheral 11/17/2013  . AF (paroxysmal atrial fibrillation) 11/17/2013  . BKA stump complication 78/29/5621  . S/P bilateral BKA (below knee amputation) 08/03/2013  . S/P BKA (below knee amputation) bilateral 07/30/2013  . Open wound of both legs with complication 30/86/5784  . Acute blood loss anemia 07/12/2013  . Syncope 07/09/2013  . Intolerance to CAPD peritoneal dialysis s/p CAPD cath removal 07/09/2013 07/05/2013  . Critical lower limb ischemia 06/30/2013  . Extremity pain 06/30/2013  . Atherosclerosis of native arteries of the extremities with ulceration(440.23) 06/17/2013  . Gait disorder 05/31/2013  . Weakness 05/24/2013  . Depression 04/03/2013  . GERD (gastroesophageal reflux disease) 04/03/2013  . Atrial fibrillation 04/02/2013  . DM (diabetes mellitus) 12/28/2012  . Other complications due to renal dialysis device, implant, and graft 05/19/2012  . Diabetes mellitus, type 2 11/13/2011  . Anemia 07/02/2011  . ESRD on hemodialysis 05/29/2011    Jamey Reas PT, DPT 11/04/2014, 10:41 AM  [facility] [address] [place], [place],  [address] Phone: [phone]   Fax:  [phone]

## 2014-11-04 NOTE — Patient Instructions (Signed)
Fitness Plan has 4 components.  1. Endurance - Recommend machines that are sitting with back support that uses both your arms and your legs.Or can do walking program Goal is 20-30 minutes. 2. Strength - weight machines enough weight to have resistance but not so much that you have to strain or cheat.  Goal is to do 15 repetitions for 2-3 sets. Rest 30-60 seconds between sets.  Do 2-4 leg machines, 2-4 arm machines & 2-4 trunk machines. Look for pictures to make sure you exercise both sides of arm, leg or trunk.  Leg machines like leg press (can do both legs or each leg by themselves),   At home can do theraband exercises for arms or legs, floor transfers, sit to stand to sit using arms as little as possible, Yoga positions 3.Flexibility - make sure you include arms, legs & trunk. Can do Yoga also 4. Balance- can work in corner with chair in front - head turns, arm motions, eyes closed; place one foot inside cabinet or on 4-6" block to work on one-legged stance,   Try to get to each component 3-5 times per week.  Going to [facility] or fitness center you want do things you can not do at home.  For example use bikes at [facility] if you don't have one at home. Then on days you don't go to [facility], do exercises at home.

## 2014-11-08 ENCOUNTER — Encounter: Payer: Self-pay | Admitting: Physical Therapy

## 2014-11-08 ENCOUNTER — Ambulatory Visit: Payer: Managed Care, Other (non HMO) | Admitting: Physical Therapy

## 2014-11-08 DIAGNOSIS — R531 Weakness: Secondary | ICD-10-CM

## 2014-11-08 DIAGNOSIS — Z940 Kidney transplant status: Secondary | ICD-10-CM

## 2014-11-08 DIAGNOSIS — R269 Unspecified abnormalities of gait and mobility: Secondary | ICD-10-CM | POA: Diagnosis not present

## 2014-11-08 DIAGNOSIS — Z89512 Acquired absence of left leg below knee: Secondary | ICD-10-CM

## 2014-11-08 DIAGNOSIS — Z7409 Other reduced mobility: Secondary | ICD-10-CM

## 2014-11-08 DIAGNOSIS — R2689 Other abnormalities of gait and mobility: Secondary | ICD-10-CM

## 2014-11-08 DIAGNOSIS — Z89511 Acquired absence of right leg below knee: Secondary | ICD-10-CM

## 2014-11-08 NOTE — Therapy (Signed)
[facility] [address] [place], [place], [address] Phone: [phone]   Fax:  [phone]  Physical Therapy Treatment  Patient Details  Name: Paul [name] MRN: [id number] Date of Birth: [birth year] Referring Provider:  [name], MD  Encounter Date: 11/08/2014      PT End of Session - 11/08/14 0800    Visit Number 23   Number of Visits 25   Date for PT Re-Evaluation 11/11/14   Authorization Type [employer]   PT Start Time 0758   PT Stop Time 0845   PT Time Calculation (min) 47 min   Activity Tolerance Patient tolerated treatment well   Behavior During Therapy [facility] for tasks assessed/performed      Past Medical History  Diagnosis Date  . Open wound of both legs with complication     Pt has had progressive wounds of both LE's including gangrene of the toes and patchy necrosis of the calves.  He has been treated at [facility] by Dr Jerline Pain with hyperbaric O2 5d per week.  He is getting Na Thiosulfate with HD for suspected calciphylaxis. Pt says doctor's aren't sure if these ulcers were diabetic ulcers or calciphylaxis.  He underwent bilat BKA on 07/30/13.  He says that the woun  . ESRD on hemodialysis     Pt has ESRD due to DM.  He had a L upper arm AVF prior to starting HD which was ligated due to L arm steal syndrome.  He started HD in Jan 2014 and did home HD.  The family couldn't cannulate the R arm AVF successfully so by mid 2014 they decided to switch to PD which was done late summer 2014.  In Jan 2015 he was admitted with FTT felt to be due to underdialysis on PD and PD was abandoned and he   . Anemia     esrd  . Hypertension     off of meds due to orthostatic hypotension  . Closed left arm fracture 1967  . Heart murmur   . Corneal abrasion, left   . GERD (gastroesophageal reflux disease)   . History of blood transfusion   . Glomerulosclerosis, diabetic   . Depression   . A-fib   . Calciphylaxis 05/2013   lower ext  . Diabetes mellitus     Type 1 iddm x 11 yrs  . Diabetic neuropathy   . Cancer     hx duodenal adenoCa 2002, resected    Past Surgical History  Procedure Laterality Date  . Whipple procedure  2002    duodenal ca, Dr. Harlow Asa  . Bone marrow biopsy  2012  . Renal biopsy, percutaneous  2012  . Tonsillectomy    . Portacath placement  2002  . Biliary diversion, external  2002  . Other surgical history      Cyst removed from back  . Av fistula placement  06/07/2011    Procedure: ARTERIOVENOUS (AV) FISTULA CREATION;  Surgeon: Angelia Mould, MD;  Location: [place];  Service: Vascular;  Laterality: Left;  . Ligation of arteriovenous  fistula  05/22/2012    Procedure: LIGATION OF ARTERIOVENOUS  FISTULA;  Surgeon: Mal Misty, MD;  Location: [place];  Service: Vascular;  Laterality: Left;  Left brachio-cephalic fistula  . Insertion of dialysis catheter  05/22/2012    Procedure: INSERTION OF DIALYSIS CATHETER;  Surgeon: Mal Misty, MD;  Location: [place];  Service: Vascular;  Laterality: N/A;  right internal jugular vein  . Av  fistula placement  06/18/2012    Procedure: ARTERIOVENOUS (AV) FISTULA CREATION;  Surgeon: Mal Misty, MD;  Location: [place];  Service: Vascular;  Laterality: Right;  [place]  . Colonoscopy      Hx: of  . Capd insertion N/A 01/14/2013    Procedure: LAPAROSCOPIC INSERTION CONTINUOUS AMBULATORY PERITONEAL DIALYSIS  (CAPD) CATHETER;  Surgeon: Adin Hector, MD;  Location: [place];  Service: General;  Laterality: N/A;  . Vascular surgery    . Removal of a dialysis catheter    . Capd removal N/A 07/09/2013    Procedure: CONTINUOUS AMBULATORY PERITONEAL DIALYSIS  (CAPD) CATHETER REMOVAL;  Surgeon: Adin Hector, MD;  Location: [place];  Service: General;  Laterality: N/A;  . Amputation Bilateral 07/30/2013    Procedure: AMPUTATION BELOW KNEE;  Surgeon: Newt Minion, MD;  Location: [place];  Service: Orthopedics;  Laterality: Bilateral;  Bilateral Below Knee  Amputations  . Amputation Bilateral 09/03/2013    Procedure: AMPUTATION BELOW KNEE;  Surgeon: Newt Minion, MD;  Location: [place];  Service: Orthopedics;  Laterality: Bilateral;  Bilateral Below Knee Amputation Revision  . Lower extremity angiogram N/A 06/14/2013    Procedure: LOWER EXTREMITY ANGIOGRAM;  Surgeon: Angelia Mould, MD;  Location: [facility] CATH LAB;  Service: Cardiovascular;  Laterality: N/A;  . Nephrectomy transplanted organ      There were no vitals filed for this visit.  Visit Diagnosis:  Abnormality of gait  Impaired functional mobility and activity tolerance  Weakness generalized  Balance problems  S/P bilateral BKA (below knee amputation)  Kidney transplant recipient      Subjective Assessment - 11/08/14 0759    Subjective No falls. Used steps on SCAT bus without one crutch without issues.   Currently in Pain? No/denies            [facility] PT Assessment - 11/08/14 0800    High Level Balance   High Level Balance Activities --   High Level Balance Comments --   Berg Balance Test   Sit to Stand Able to stand  independently using hands   Standing Unsupported Able to stand safely 2 minutes   Sitting with Back Unsupported but Feet Supported on Floor or Stool Able to sit safely and securely 2 minutes   Stand to Sit Sits safely with minimal use of hands   Transfers Able to transfer safely, minor use of hands   Standing Unsupported with Eyes Closed Able to stand 10 seconds safely   Standing Ubsupported with Feet Together Able to place feet together independently and stand 1 minute safely   From Standing, Reach Forward with Outstretched Arm Can reach confidently >25 cm (10")   From Standing Position, Pick up Object from Floor Able to pick up shoe safely and easily   From Standing Position, Turn to Look Behind Over each Shoulder Looks behind from both sides and weight shifts well   Turn 360 Degrees Able to turn 360 degrees safely but slowly   Standing Unsupported,  Alternately Place Feet on Step/Stool Able to complete 4 steps without aid or supervision   Standing Unsupported, One Foot in Front Able to take small step independently and hold 30 seconds   Standing on One Leg Tries to lift leg/unable to hold 3 seconds but remains standing independently   Total Score 46                     [facility] Adult PT Treatment/Exercise - 11/08/14 0800  Transfers   Transfers --  Floor Transfer pushing on mat table with minA   Sit to Stand 6: Modified independent (Device/Increase time);Without upper extremity assist;From chair/3-in-1;From elevated surface   Stand to Sit 6: Modified independent (Device/Increase time);Without upper extremity assist;To chair/3-in-1   Ambulation/Gait   Ambulation/Gait Yes   Ambulation/Gait Assistance 6: Modified independent (Device/Increase time)   Ambulation/Gait Assistance Details 200' on grass with 1 crutch, 850' outside / inside with 1 crutch   Ambulation Distance (Feet) 850 Feet   Assistive device Prostheses;Straight cane;None   Gait Pattern Step-through pattern;Decreased stride length   Stairs Yes   Stairs Assistance 6: Modified independent (Device/Increase time)   Stair Management Technique One rail Right;Alternating pattern;Forwards;With cane   Number of Stairs 4  4 reps   Ramp 6: Modified independent (Device)  single crutch & prostheses   Curb 6: Modified independent (Device/increase time)  single crutch & prostheses   Knee/Hip Exercises: Aerobic   Stationary Bike Scifit legs only level 4.5 x 8 mintues with goal rpm >/= 45 for strengthening and activity tolerance.   Prosthetics   Current prosthetic wear tolerance (days/week)  7 days /wk   Current prosthetic wear tolerance (#hours/day)  all awake hours                PT Education - 11/08/14 0800    Education provided Yes   Education Details reviewed handout for fitness plan, appropriate / safe progression of assistive device depending on terrain,  distance, fatigue & supervision but challenge himself safely   Person(s) Educated Patient   Methods Explanation;Handout   Comprehension Verbalized understanding          PT Short Term Goals - 09/15/14 0806    PT SHORT TERM GOAL #1   Title Pt will be able to: Ambulate 500 ft. with forearm crutches and prostheses with supervision. (Target Date: 09/09/2014)   Baseline MET 09/13/14   Status Achieved   PT SHORT TERM GOAL #2   Title Patient will be able to ambulate 67' around furniture with 1 crutch & prostheses with supervision.  (Target Date: 09/09/2014)   Baseline MET 09/13/14   Status Achieved   PT SHORT TERM GOAL #3   Title Pt will be at to:  Perform standing activities for >15 minutes with prostheses with minimal c/o fatigue.  (Target Date: 09/09/2014)   Baseline MET 09/13/14   Status Achieved   PT SHORT TERM GOAL #4   Title Pt will be able to reach 10" & within 5" of floor with support of 1 crutch with supervision. (Target Date: 09/09/2014)   Baseline MET 09/13/14   Time 1   Period Months   Status Achieved   PT SHORT TERM GOAL #5   Title Pt will be able to: negotiate ramp & curb with forearm crutches & prostheses with supervision.  (Target Date: 09/09/2014)   Baseline NOT MET 09/13/14 but progressing   Status Not Met           PT Long Term Goals - 11/08/14 0759    PT LONG TERM GOAL #1   Title Pt will be able to: Ambulate 826ft with one crutch & prostheses modified independent. (NEW Target Date: 11/11/2014)   Baseline MET 11/08/2014   Time 4   Period Weeks   Status Achieved   PT LONG TERM GOAL #2   Title Pt will be able to: ambulate 200' on uneven (grass) surfaces with LRAD & prostheses modified independent.  (NEW Target Date: 11/11/2014)   Baseline  MET 11/08/2014   Time 4   Period Weeks   Status Achieved   PT LONG TERM GOAL #3   Title Pt will be able to: negotiate ramp, curb, stairs with LRAD & prostheses modified independent. (NEW Target Date: 11/11/2014)   Baseline MET  11/08/2014   Time 4   Period Weeks   Status Achieved   PT LONG TERM GOAL #4   Title Pt will be able to: perform standing activities for > 20 minutes & prostheses with no pain or discomfort.  (Target Date: 10/14/2014)   Baseline MET 10/13/2014   Time 12   Period Weeks   Status Achieved   PT LONG TERM GOAL #5   Title Berg Balance >/= 45/56 (NEW Target Date: 11/11/2014)   Baseline MET 11/08/2014 Merrilee Jansky Balance 46/56   Time 4   Period Weeks   Status Achieved   PT LONG TERM GOAL #6   Title Patient will ambulate in home simulated (100' and single step) with 1 crutch or less restrictive device modified independent. (NEW Target Date: 11/10/2104)   Baseline MET 11/08/2014 with single point cane independent or no device near furniture or wife in ear shot.    Time 4   Period Weeks   Status Achieved               Plan - 11/08/14 0800    Clinical Impression Statement Patient met all LTGs. He is functioning with LRAD at full community level including variable cadence with bilateral Transtibial Amputations. He was able to return to work   Pt will benefit from skilled therapeutic intervention in order to improve on the following deficits Abnormal gait;Decreased activity tolerance;Decreased balance;Decreased endurance;Decreased mobility;Decreased strength;Prosthetic Dependency   Rehab Potential Good   PT Frequency 2x / week   PT Duration 4 weeks   PT Treatment/Interventions ADLs/Self Care Home Management;Gait training;Stair training;Functional mobility training;Therapeutic activities;Therapeutic exercise;Balance training;Neuromuscular re-education;Patient/family education   PT Next Visit Plan discharge today   Consulted and Agree with Plan of Care Patient;Family member/caregiver   Family Member Consulted wife        Problem List Patient Active Problem List   Diagnosis Date Noted  . Abnormal cardiovascular stress test 12/29/2013  . Awaiting organ transplant 12/09/2013  . Leg ulcer 11/17/2013   . Chronic kidney disease (CKD), stage V 11/17/2013  . Diabetes 11/17/2013  . Cancer of duodenum 11/17/2013  . Dyslipidemia 11/17/2013  . Chronic kidney disease requiring chronic dialysis 11/17/2013  . Hypercholesteremia 11/17/2013  . H/O malignant neoplasm 11/17/2013  . H/O: HTN (hypertension) 11/17/2013  . BP (high blood pressure) 11/17/2013  . Hypotension 11/17/2013  . Angiopathy, peripheral 11/17/2013  . AF (paroxysmal atrial fibrillation) 11/17/2013  . BKA stump complication 31/54/0086  . S/P bilateral BKA (below knee amputation) 08/03/2013  . S/P BKA (below knee amputation) bilateral 07/30/2013  . Open wound of both legs with complication 76/19/5093  . Acute blood loss anemia 07/12/2013  . Syncope 07/09/2013  . Intolerance to CAPD peritoneal dialysis s/p CAPD cath removal 07/09/2013 07/05/2013  . Critical lower limb ischemia 06/30/2013  . Extremity pain 06/30/2013  . Atherosclerosis of native arteries of the extremities with ulceration(440.23) 06/17/2013  . Gait disorder 05/31/2013  . Weakness 05/24/2013  . Depression 04/03/2013  . GERD (gastroesophageal reflux disease) 04/03/2013  . Atrial fibrillation 04/02/2013  . DM (diabetes mellitus) 12/28/2012  . Other complications due to renal dialysis device, implant, and graft 05/19/2012  . Diabetes mellitus, type 2 11/13/2011  . Anemia 07/02/2011  . ESRD  on hemodialysis 05/29/2011    Jamey Reas  PT, DPT  11/08/2014, 9:30 AM  [facility] [address] [place], [place], [address] Phone: [phone]   Fax:  [phone]

## 2014-11-08 NOTE — Therapy (Signed)
[facility] [address] [place], [place], [address] Phone: [phone]   Fax:  [phone]  Patient Details  Name: Paul [name] MRN: [id number] Date of Birth: Jun 23, 1955 Referring Provider:  No ref. provider found  Encounter Date: 11/08/2014  PHYSICAL THERAPY DISCHARGE SUMMARY  Visits from Start of Care: 23  Current functional level related to goals / functional outcomes:     PT Long Term Goals - 11/08/14 0759    PT LONG TERM GOAL #1   Title Pt will be able to: Ambulate 812ft with one crutch & prostheses modified independent. (NEW Target Date: 11/11/2014)   Baseline MET 11/08/2014   Time 4   Period Weeks   Status Achieved   PT LONG TERM GOAL #2   Title Pt will be able to: ambulate 200' on uneven (grass) surfaces with LRAD & prostheses modified independent.  (NEW Target Date: 11/11/2014)   Baseline MET 11/08/2014   Time 4   Period Weeks   Status Achieved   PT LONG TERM GOAL #3   Title Pt will be able to: negotiate ramp, curb, stairs with LRAD & prostheses modified independent. (NEW Target Date: 11/11/2014)   Baseline MET 11/08/2014   Time 4   Period Weeks   Status Achieved   PT LONG TERM GOAL #4   Title Pt will be able to: perform standing activities for > 20 minutes & prostheses with no pain or discomfort.  (Target Date: 10/14/2014)   Baseline MET 10/13/2014   Time 12   Period Weeks   Status Achieved   PT LONG TERM GOAL #5   Title Berg Balance >/= 45/56 (NEW Target Date: 11/11/2014)   Baseline MET 11/08/2014 Merrilee Jansky Balance 46/56   Time 4   Period Weeks   Status Achieved   PT LONG TERM GOAL #6   Title Patient will ambulate in home simulated (100' and single step) with 1 crutch or less restrictive device modified independent. (NEW Target Date: 11/10/2104)   Baseline MET 11/08/2014 with single point cane independent or no device near furniture or wife in ear shot.    Time 4   Period Weeks   Status Achieved        Remaining deficits: Patient has mild weakness & endurance from chronic health conditions. It has improved with PT but limits his mobility.  Berg Balance 46/56 indicates 50% fall risk.   Education / Equipment: Theatre stage manager /HEP. Plan: Patient agrees to discharge.  Patient goals were met. Patient is being discharged due to meeting the stated rehab goals.  ?????       [name] PT, DPT 11/08/2014, 8:44 PM  [facility] [address] [place], [place], [address] Phone: [phone]   Fax:  [phone]

## 2014-11-11 ENCOUNTER — Ambulatory Visit: Payer: Managed Care, Other (non HMO) | Admitting: Physical Therapy

## 2014-11-15 ENCOUNTER — Ambulatory Visit: Payer: Managed Care, Other (non HMO) | Admitting: Physical Therapy

## 2014-11-17 ENCOUNTER — Encounter: Payer: Managed Care, Other (non HMO) | Admitting: Physical Therapy

## 2015-04-27 ENCOUNTER — Encounter: Payer: Self-pay | Admitting: Physical Therapy

## 2015-04-27 ENCOUNTER — Ambulatory Visit: Payer: Managed Care, Other (non HMO) | Attending: Family Medicine | Admitting: Physical Therapy

## 2015-04-27 DIAGNOSIS — R29818 Other symptoms and signs involving the nervous system: Secondary | ICD-10-CM | POA: Diagnosis present

## 2015-04-27 DIAGNOSIS — R2689 Other abnormalities of gait and mobility: Secondary | ICD-10-CM

## 2015-04-27 DIAGNOSIS — Z89512 Acquired absence of left leg below knee: Secondary | ICD-10-CM | POA: Diagnosis present

## 2015-04-27 DIAGNOSIS — R2991 Unspecified symptoms and signs involving the musculoskeletal system: Secondary | ICD-10-CM | POA: Diagnosis present

## 2015-04-27 DIAGNOSIS — Z7409 Other reduced mobility: Secondary | ICD-10-CM | POA: Diagnosis present

## 2015-04-27 DIAGNOSIS — R5381 Other malaise: Secondary | ICD-10-CM | POA: Insufficient documentation

## 2015-04-27 DIAGNOSIS — R2681 Unsteadiness on feet: Secondary | ICD-10-CM

## 2015-04-27 DIAGNOSIS — Z5189 Encounter for other specified aftercare: Secondary | ICD-10-CM | POA: Insufficient documentation

## 2015-04-27 DIAGNOSIS — Z89511 Acquired absence of right leg below knee: Secondary | ICD-10-CM

## 2015-04-27 DIAGNOSIS — Z4789 Encounter for other orthopedic aftercare: Secondary | ICD-10-CM

## 2015-04-27 DIAGNOSIS — Z940 Kidney transplant status: Secondary | ICD-10-CM | POA: Diagnosis present

## 2015-04-27 DIAGNOSIS — R269 Unspecified abnormalities of gait and mobility: Secondary | ICD-10-CM | POA: Diagnosis not present

## 2015-04-27 DIAGNOSIS — R6889 Other general symptoms and signs: Secondary | ICD-10-CM | POA: Diagnosis present

## 2015-04-27 DIAGNOSIS — R531 Weakness: Secondary | ICD-10-CM | POA: Diagnosis present

## 2015-04-27 NOTE — Therapy (Signed)
[facility] [address] [place], [place], [address] Phone: [phone]   Fax:  [phone]  Physical Therapy Evaluation  Patient Details  Name: Paul [name] MRN: [id number] Date of Birth: 1956/04/27 Referring Provider: [name], MD  Encounter Date: 04/27/2015      PT End of Session - 04/27/15 1400    Visit Number 1   Number of Visits 15   Date for PT Re-Evaluation 07/21/15   Authorization Type [employer]   PT Start Time 1315   PT Stop Time 1400   PT Time Calculation (min) 45 min   Activity Tolerance Patient tolerated treatment well   Behavior During Therapy [facility] for tasks assessed/performed      Past Medical History  Diagnosis Date  . Open wound of both legs with complication     Pt has had progressive wounds of both LE's including gangrene of the toes and patchy necrosis of the calves.  He has been treated at [facility] by Dr Jerline Pain with hyperbaric O2 5d per week.  He is getting Na Thiosulfate with HD for suspected calciphylaxis. Pt says doctor's aren't sure if these ulcers were diabetic ulcers or calciphylaxis.  He underwent bilat BKA on 07/30/13.  He says that the woun  . ESRD on hemodialysis ([place])     Pt has ESRD due to DM.  He had a L upper arm AVF prior to starting HD which was ligated due to L arm steal syndrome.  He started HD in Jan 2014 and did home HD.  The family couldn't cannulate the R arm AVF successfully so by mid 2014 they decided to switch to PD which was done late summer 2014.  In Jan 2015 he was admitted with FTT felt to be due to underdialysis on PD and PD was abandoned and he   . Anemia     esrd  . Hypertension     off of meds due to orthostatic hypotension  . Closed left arm fracture 1967  . Heart murmur   . Corneal abrasion, left   . GERD (gastroesophageal reflux disease)   . History of blood transfusion   . Glomerulosclerosis, diabetic ([place])   . Depression   . A-fib ([place])   .  Calciphylaxis 05/2013    lower ext  . Diabetes mellitus     Type 1 iddm x 11 yrs  . Diabetic neuropathy ([place])   . Cancer ([place])     hx duodenal adenoCa 2002, resected    Past Surgical History  Procedure Laterality Date  . Whipple procedure  2002    duodenal ca, Dr. Harlow Asa  . Bone marrow biopsy  2012  . Renal biopsy, percutaneous  2012  . Tonsillectomy    . Portacath placement  2002  . Biliary diversion, external  2002  . Other surgical history      Cyst removed from back  . Av fistula placement  06/07/2011    Procedure: ARTERIOVENOUS (AV) FISTULA CREATION;  Surgeon: Angelia Mould, MD;  Location: [facility];  Service: Vascular;  Laterality: Left;  . Ligation of arteriovenous  fistula  05/22/2012    Procedure: LIGATION OF ARTERIOVENOUS  FISTULA;  Surgeon: Mal Misty, MD;  Location: [facility];  Service: Vascular;  Laterality: Left;  Left brachio-cephalic fistula  . Insertion of dialysis catheter  05/22/2012    Procedure: INSERTION OF DIALYSIS CATHETER;  Surgeon: Mal Misty, MD;  Location: [facility];  Service: Vascular;  Laterality: N/A;  right  internal jugular vein  . Av fistula placement  06/18/2012    Procedure: ARTERIOVENOUS (AV) FISTULA CREATION;  Surgeon: Mal Misty, MD;  Location: [facility];  Service: Vascular;  Laterality: Right;  [place]  . Colonoscopy      Hx: of  . Capd insertion N/A 01/14/2013    Procedure: LAPAROSCOPIC INSERTION CONTINUOUS AMBULATORY PERITONEAL DIALYSIS  (CAPD) CATHETER;  Surgeon: Adin Hector, MD;  Location: [facility];  Service: General;  Laterality: N/A;  . Vascular surgery    . Removal of a dialysis catheter    . Capd removal N/A 07/09/2013    Procedure: CONTINUOUS AMBULATORY PERITONEAL DIALYSIS  (CAPD) CATHETER REMOVAL;  Surgeon: Adin Hector, MD;  Location: [facility];  Service: General;  Laterality: N/A;  . Amputation Bilateral 07/30/2013    Procedure: AMPUTATION BELOW KNEE;  Surgeon: Newt Minion, MD;  Location: [facility];  Service: Orthopedics;  Laterality:  Bilateral;  Bilateral Below Knee Amputations  . Amputation Bilateral 09/03/2013    Procedure: AMPUTATION BELOW KNEE;  Surgeon: Newt Minion, MD;  Location: [facility];  Service: Orthopedics;  Laterality: Bilateral;  Bilateral Below Knee Amputation Revision  . Lower extremity angiogram N/A 06/14/2013    Procedure: LOWER EXTREMITY ANGIOGRAM;  Surgeon: Angelia Mould, MD;  Location: [facility] CATH LAB;  Service: Cardiovascular;  Laterality: N/A;  . Nephrectomy transplanted organ      There were no vitals filed for this visit.  Visit Diagnosis:  Abnormality of gait  Impaired functional mobility and activity tolerance  Weakness generalized  Balance problems  S/P bilateral BKA (below knee amputation) ([place])  Kidney transplant recipient  Unsteadiness  Impaired transfers  Encounter for prosthetic gait training      Subjective Assessment - 04/27/15 1321    Subjective He underwent bilateral Transtibial 07/30/2013 due to diabetic wounds. He underwent a kidney transplant 05/23/2014. He has original prostheses except new liners. He presents for PT evaluation to progress mobility.    Patient is accompained by: Family member   Patient Stated Goals To walk up/down stairs with 1 rail and no device, negotiate curbs better.  stand & balance on one foot, Walk without waddling   Currently in Pain? No/denies            [facility] PT Assessment - 04/27/15 1315    Assessment   Medical Diagnosis Bil. Transtibial Amputations   Referring Provider [name], MD   Onset Date/Surgical Date 04/03/15  MD appointment   Precautions   Precautions Fall   Restrictions   Weight Bearing Restrictions No   Balance Screen   Has the patient fallen in the past 6 months Yes   How many times? 2  rib fx, 1st descending curb, 2nd fatigue -tripped   Has the patient had a decrease in activity level because of a fear of falling?  No   Is the patient reluctant to leave their home because of a fear of falling?  No   Home  Environment   Living Environment Private residence   Living Arrangements Spouse/significant other;Children   Type of [place] entrance  2nd entrance has 4 steps with 2 wide rails   Home Layout Two level;Full bath on main level;1/2 bath on main level;Able to live on main level with bedroom/bathroom   Alternate Level Stairs-Number of Steps 14   Alternate Level Stairs-Rails Right;Left   Prior Function   Level of Independence Independent;Independent with household mobility without device;Independent with community mobility without device;Independent with  gait   Vocation Full time employment   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, 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   Leisure Golf, cut grass   Observation/Other Assessments   Focus on Therapeutic Outcomes (FOTO)  47.85 Functional Status   Fear Avoidance Belief Questionnaire (FABQ)  19   Posture/Postural Control   Posture/Postural Control Postural limitations   Postural Limitations Rounded Shoulders;Forward head;Flexed trunk   ROM / Strength   AROM / PROM / Strength AROM;Strength   AROM   Overall AROM  Within functional limits for tasks performed   Strength   Overall Strength Deficits   Strength Assessment Site Hip;Knee   Right/Left Hip Right;Left   Right Hip Flexion 4/5   Right Hip Extension 3+/5   Right Hip ABduction 4-/5   Left Hip Flexion 4/5   Left Hip Extension 3+/5   Left Hip ABduction 4-/5   Right/Left Knee Right;Left   Right Knee Flexion 4/5   Right Knee Extension 4/5   Left Knee Flexion 4/5   Left Knee Extension 4/5   Transfers   Transfers Sit to Stand;Stand to Sit   Sit to Stand 4: Min assist;6: Modified independent (Device/Increase time);With upper extremity assist;Without upper extremity assist;From chair/3-in-1;With armrests  MinA without use of UEs   Stand to Sit 5: Supervision;6: Modified independent (Device/Increase time);With upper extremity assist;Without upper extremity  assist;With armrests;To chair/3-in-1  SBA uncontrolled descent without UE assist   Ambulation/Gait   Ambulation/Gait Yes   Ambulation/Gait Assistance 5: Supervision   Ambulation/Gait Assistance Details uses crutch & rail for stairs on [employer] bus (taller & less depth) and requires cane to negotiate curbs.    Ambulation Distance (Feet) 250 Feet   Assistive device Prostheses   Gait Pattern Step-through pattern;Decreased stride length;Lateral hip instability;Trunk flexed;Wide base of support   Ambulation Surface Indoor;Level   Gait velocity 3.12 ft/sec comfortable; 4.36 ft/sec fast with min Guard due to instability   Stairs Yes   Stairs Assistance 4: Min assist;5: Supervision  SBA 2 rails; MinA 1 rail   Stair Management Technique Two rails;One rail Right  with bil. BKA prostheses   Number of Stairs 4   Ramp 5: Supervision  bil. BKA prostheses   Curb 4: Min assist;3: Mod assist  bil BKA prostheses MinA descend & ModA ascend   Standardized Balance Assessment   Standardized Balance Assessment Berg Balance Test   Berg Balance Test   Sit to Stand Able to stand  independently using hands   Standing Unsupported Able to stand safely 2 minutes   Sitting with Back Unsupported but Feet Supported on Floor or Stool Able to sit safely and securely 2 minutes   Stand to Sit Controls descent by using hands   Transfers Able to transfer safely, definite need of hands   Standing Unsupported with Eyes Closed Able to stand 10 seconds with supervision   Standing Ubsupported with Feet Together Able to place feet together independently but unable to hold for 30 seconds   From Standing, Reach Forward with Outstretched Arm Can reach forward >12 cm safely (5")   From Standing Position, Pick up Object from Floor Able to pick up shoe, needs supervision   From Standing Position, Turn to Look Behind Over each Shoulder Turn sideways only but maintains balance   Turn 360 Degrees Able to turn 360 degrees safely but slowly    Standing Unsupported, Alternately Place Feet on Step/Stool Needs assistance to keep from falling or unable to try   Standing Unsupported, One  Foot in [place] to take small step independently and hold 30 seconds   Standing on One Leg Tries to lift leg/unable to hold 3 seconds but remains standing independently  1 sec on both right & left legs   Total Score 35   Functional Gait  Assessment   Gait assessed  Yes   Gait Level Surface Walks 20 ft in less than 7 sec but greater than 5.5 sec, uses assistive device, slower speed, mild gait deviations, or deviates 6-10 in outside of the 12 in walkway width.   Change in Gait Speed Able to change speed, demonstrates mild gait deviations, deviates 6-10 in outside of the 12 in walkway width, or no gait deviations, unable to achieve a major change in velocity, or uses a change in velocity, or uses an assistive device.   Gait with Horizontal Head Turns Performs head turns smoothly with slight change in gait velocity (eg, minor disruption to smooth gait path), deviates 6-10 in outside 12 in walkway width, or uses an assistive device.   Gait with Vertical Head Turns Performs task with slight change in gait velocity (eg, minor disruption to smooth gait path), deviates 6 - 10 in outside 12 in walkway width or uses assistive device   Gait and Pivot Turn Pivot turns safely in greater than 3 sec and stops with no loss of balance, or pivot turns safely within 3 sec and stops with mild imbalance, requires small steps to catch balance.   Step Over Obstacle Is able to step over one shoe box (4.5 in total height) but must slow down and adjust steps to clear box safely. May require verbal cueing.   Gait with Narrow Base of Support Ambulates less than 4 steps heel to toe or cannot perform without assistance.   Gait with Eyes Closed Walks 20 ft, slow speed, abnormal gait pattern, evidence for imbalance, deviates 10-15 in outside 12 in walkway width. Requires more than 9 sec to  ambulate 20 ft.   Ambulating Backwards Walks 20 ft, slow speed, abnormal gait pattern, evidence for imbalance, deviates 10-15 in outside 12 in walkway width.   Steps Two feet to a stair, must use rail.   Total Score 14         Prosthetics Assessment - 04/27/15 0001    Prosthetics   Prosthetic Care Independent with Skin check;Residual limb care;Prosthetic cleaning;Correct ply sock adjustment;Proper wear schedule/adjustment   Donning prosthesis  Modified independent (Device/Increase time)   Doffing prosthesis  Modified independent (Device/Increase time)   Current prosthetic wear tolerance (days/week)  7 days /wk   Current prosthetic wear tolerance (#hours/day)  all awake hours   Current prosthetic weight-bearing tolerance (hours/day)  >20 minutes with no pain or discomfort   Edema none   Residual limb condition  no issues                            PT Short Term Goals - 04/27/15 1400    PT SHORT TERM GOAL #1   Title Patient demonstrates understanding of updated HEP (Target Date: 05/26/2015)   Time 4   Period Weeks   Status New   PT SHORT TERM GOAL #2   Title Patient ambulates with prostheses only with head turns to scan environment maintaining path.  (Target Date: 05/26/2015)   Time 4   Period Weeks   Status New   PT SHORT TERM GOAL #3   Title Patient able to reach 10"  anteriorly and pick up object from floor safely without UE support.  (Target Date: 05/26/2015)   Time 4   Period Weeks   Status New   PT SHORT TERM GOAL #4   Title Patient negotiates stairs with single rail safely with supervision.  (Target Date: 05/26/2015)   Time 4   Period Weeks   Status New           PT Long Term Goals - 04/27/15 1400    PT LONG TERM GOAL #1   Title Patient verbalizes & demonstrates understanding of ongoing fitness plan / HEP.  (Target Date: 07/21/2015)   Time 12   Period Weeks   Status New   PT LONG TERM GOAL #2   Title Berg Balance >45/56 to reduce risk of falls with  standing activities. (Target Date: 07/21/2015)   Time 12   Period Weeks   Status New   PT LONG TERM GOAL #3   Title Functional Gait Assessment >20/30 to reduce fall risk with gait activities. (Target Date: 07/21/2015)   Time 12   Period Weeks   Status New   PT LONG TERM GOAL #4   Title Patient negotiates ramps & curbs without device and stairs with single rail only with prostheses modified independent. (Target Date: 07/21/2015)   Time 12   Period Weeks   Status New               Plan - 04/27/15 1400    Clinical Impression Statement This 59yo male has bilateral Transtibial Amputation prostheses and has tried to decrease dependency on assistive devices as he returns to full community activities. He has weakness in bilateral hips & knees impacting function especially with stairs & curbs where he needs to raise & lower body weight. He has fallen twice with one on curb where he broke his ribs. Functional Gait Assessment 14/30 indicating fall risk with gait activities. Berg Balance 35/56 (<45/56 indicates fall risk with standing activiies).    Pt will benefit from skilled therapeutic intervention in order to improve on the following deficits Abnormal gait;Decreased balance;Decreased endurance;Decreased mobility;Decreased strength;Prosthetic Dependency   Rehab Potential Good   PT Frequency Other (comment)  After eval, 2x/wk for 3 weeks, then 1x/wk for 9 weeks   PT Duration 12 weeks   PT Treatment/Interventions ADLs/Self Care Home Management;Gait training;Stair training;Functional mobility training;Therapeutic activities;Therapeutic exercise;Balance training;Neuromuscular re-education;Patient/family education;Prosthetic Training   PT Next Visit Plan Update HEP for strength including closed chain step ups, work on stair technique   Consulted and Agree with Plan of Care Patient;Family member/caregiver   Family Member Consulted wife, [name]         Problem List Patient Active Problem List    Diagnosis Date Noted  . Abnormal cardiovascular stress test 12/29/2013  . Awaiting organ transplant 12/09/2013  . Leg ulcer ([place]) 11/17/2013  . Chronic kidney disease (CKD), stage V ([place]) 11/17/2013  . Diabetes ([place]) 11/17/2013  . Cancer of duodenum ([place]) 11/17/2013  . Dyslipidemia 11/17/2013  . Chronic kidney disease requiring chronic dialysis ([place]) 11/17/2013  . Hypercholesteremia 11/17/2013  . H/O malignant neoplasm 11/17/2013  . H/O: HTN (hypertension) 11/17/2013  . BP (high blood pressure) 11/17/2013  . Hypotension 11/17/2013  . Angiopathy, peripheral ([place]) 11/17/2013  . AF (paroxysmal atrial fibrillation) ([place]) 11/17/2013  . BKA stump complication ([place]) 44/81/8563  . S/P bilateral BKA (below knee amputation) ([place]) 08/03/2013  . S/P BKA (below knee amputation) bilateral ([place]) 07/30/2013  . Open wound of both legs with complication 14/97/0263  .  Acute blood loss anemia 07/12/2013  . Syncope 07/09/2013  . Intolerance to CAPD peritoneal dialysis s/p CAPD cath removal 07/09/2013 07/05/2013  . Critical lower limb ischemia 06/30/2013  . Extremity pain 06/30/2013  . Atherosclerosis of native arteries of the extremities with ulceration(440.23) 06/17/2013  . Gait disorder 05/31/2013  . Weakness 05/24/2013  . Depression 04/03/2013  . GERD (gastroesophageal reflux disease) 04/03/2013  . Atrial fibrillation ([place]) 04/02/2013  . DM (diabetes mellitus) ([place]) 12/28/2012  . Other complications due to renal dialysis device, implant, and graft 05/19/2012  . Diabetes mellitus, type 2 ([place]) 11/13/2011  . Anemia 07/02/2011  . ESRD on hemodialysis ([place]) 05/29/2011    Starling Jessie PT, DPT 04/27/2015, 10:44 PM  [facility] [address] [place], [place], [address] Phone: [phone]   Fax:  [phone]  Name: Paul [name] MRN: [id number] Date of Birth: [birth year]

## 2015-05-01 ENCOUNTER — Encounter: Payer: Self-pay | Admitting: Physical Therapy

## 2015-05-01 ENCOUNTER — Ambulatory Visit: Payer: Managed Care, Other (non HMO) | Admitting: Physical Therapy

## 2015-05-01 DIAGNOSIS — R269 Unspecified abnormalities of gait and mobility: Secondary | ICD-10-CM

## 2015-05-01 DIAGNOSIS — Z89511 Acquired absence of right leg below knee: Secondary | ICD-10-CM

## 2015-05-01 DIAGNOSIS — R531 Weakness: Secondary | ICD-10-CM

## 2015-05-01 DIAGNOSIS — Z7409 Other reduced mobility: Secondary | ICD-10-CM

## 2015-05-01 DIAGNOSIS — Z89512 Acquired absence of left leg below knee: Secondary | ICD-10-CM

## 2015-05-01 DIAGNOSIS — R2689 Other abnormalities of gait and mobility: Secondary | ICD-10-CM

## 2015-05-01 DIAGNOSIS — Z940 Kidney transplant status: Secondary | ICD-10-CM

## 2015-05-01 NOTE — Patient Instructions (Signed)
Fitness Plan has 4 components.  1. Endurance - Recommend machines that are sitting with back support that uses both your arms and your legs.Or can do walking program Goal is 20-30 minutes. 2. Strength - weight machines enough weight to have resistance but not so much that you have to strain or cheat.  Goal is to do 15 repetitions for 2-3 sets. Rest 30-60 seconds between sets.  Do 2-4 leg machines, 2-4 arm machines & 2-4 trunk machines. Look for pictures to make sure you exercise both sides of arm, leg or trunk.  Leg machines like leg press (can do both legs or each leg by themselves),   At home can do theraband exercises for arms or legs, floor transfers, sit to stand to sit using arms as little as possible, Yoga positions 3.Flexibility - make sure you include arms, legs & trunk. Can do Yoga also 4. Balance- can work in corner with chair in front - head turns, arm motions, eyes closed; place one foot inside cabinet or on 4-6" block to work on one-legged stance,   Try to get to each component 3-5 times per week.  Going to [facility] or fitness center you want do things you can not do at home.  For example use bikes at [facility] if you don't have one at home. Then on days you don't go to [facility], do exercises at home.FUNCTIONAL MOBILITY: Lateral Step Up    Step up sideways, leading with right leg. ___ reps per set, ___ sets per day, ___ days per week Repeat leading with other leg.  Copyright  VHI. All rights reserved.  Step Down    Stand on step. Step down leading with right leg. Slowly step up leading with same leg. Perform ___ reps.  Copyright  VHI. All rights reserved.  Alternating Step Up    Stand facing step. Step up leading with right leg. Step back down leading with same leg. Perform ___ reps.  Copyright  VHI. All rights reserved.  (Home) Abduction: Lateral Resist (Anchor)    Opposite side toward anchor, right arm across body, lift arm out to side to shoulder height, keeping thumb  up. Repeat ____ times per set. Do ____ sets per session. Do ____ sessions per week. Use ____ lb weights.  Copyright  VHI. All rights reserved.  (Home) Extension: Cranial, Straight Arm Row    Facing anchor, lift arms forward to shoulder height, elbows straight. Keep back straight. Repeat ____ times per set. Do ____ sets per session. Do ____ sessions per week. Use ____ lb weights.  Copyright  VHI. All rights reserved.  (Home) Extension: Isometric / Bilateral Arm Retraction - Sitting    Facing anchor, straps above elbows, neck and spine straight, pull elbows back. Repeat ____ times per set. Do ____ sets per session. Do ____ sessions per week. Use ____ lb weights.  Copyright  VHI. All rights reserved.  (Home) Functional: Push - Elevated    Anchor behind, arms over head, forward and straight. Step forward on left leg, keeping arms and trunk straight. Repeat ____ times per set. Do ____ sets per session. Do ____ sessions per week. Use ____ lb weights.  Copyright  VHI. All rights reserved.  (Home) Stabilization: Push    Anchor behind, stomach muscles tight, push arms forward without moving trunk. Repeat ____ times per set. Do ____ sets per session. Do ____ sessions per week. Use ____ lb weights.  Copyright  VHI. All rights reserved.  Abduction Lift: "Chicken Wings" (Eccentric), (Resistance  Band)    Lift elbows up to shoulder height. Avoid hiking shoulders. Slowly lower elbows for 3-5 seconds. Use ________ resistance band. ___ reps per set, ___ sets per day, ___ days per week.   http://ecce.exer.us/172   Copyright  VHI. All rights reserved.  EXTENSION: Standing - Resistance Band: Stable (Active)    Stand, right arm at side. Against yellow resistance band, draw arm backward, as far as possible, keeping elbow straight. Complete ___ sets of ___ repetitions. Perform ___ sessions per day.  Copyright  VHI. All rights reserved.  FLEXION: Standing - Stable: Resistance  Band (Active)    Stand with right arm at side. Against yellow resistance band, lift arm forward and up as high as possible, keeping elbow straight. Complete ___ sets of ___ repetitions. Perform ___ sessions per day.  Copyright  VHI. All rights reserved.  Scapular Retraction: Rowing (Eccentric) - Arms - 45 Degrees (Resistance Band)    Hold end of band in each hand. Pull back until elbows are even with trunk. Keep elbows out from sides at 45, thumbs up. Slowly release for 3-5 seconds. Use ________ resistance band. ___ reps per set, ___ sets per day, ___ days per week.   http://ecce.exer.us/228   Copyright  VHI. All rights reserved.  Strengthening: Resisted Flexion    Hold tubing with left arm at side. Pull forward and up. Move shoulder through pain-free range of motion. Repeat ____ times per set. Do ____ sets per session. Do ____ sessions per day.  http://orth.exer.us/824   Copyright  VHI. All rights reserved.  Strengthening: Resisted Extension    Hold tubing in right hand, arm forward. Pull arm back, elbow straight. Repeat ____ times per set. Do ____ sets per session. Do ____ sessions per day.  http://orth.exer.us/832   Copyright  VHI. All rights reserved.  (Home) Balance: With Hip Abduction - Unilateral Stance (Resist)    Opposite side toward anchor, strap around left ankle, leg in. Swing leg across body and out to side. Use arm support only as needed for balance. Repeat ____ times per set. Do ____ sets per session. Do ____ sessions per week. Use ____ lb weights.  Copyright  VHI. All rights reserved.  (Home) Balance: With Hip Adduction - Unilateral Stance (Resist)    Left side toward anchor, strap around ankle, foot out to side. Swing foot across body and beyond. Use arm support for balance. Repeat ____ times per set. Do ____ sets per session. Do ____ sessions per week. Use ____ lb weights.  Copyright  VHI. All rights reserved.  HIP / KNEE: Extension -  Standing (Band)    Place band around leg. Squeeze glutes. Raise and lift leg backward. Keep knee straight. Hold ___ seconds. Use ________ band. ___ reps per set, ___ sets per day, ___ days per week Hold onto a support.  Copyright  VHI. All rights reserved.  Strengthening: Hip Flexion - Resisted    With tubing around left ankle, anchor behind, bring leg forward, keeping knee straight. Repeat ____ times per set. Do ____ sets per session. Do ____ sessions per day.  http://orth.exer.us/638   Copyright  VHI. All rights reserved.

## 2015-05-02 NOTE — Therapy (Signed)
[facility] [address] [place], [place], [address] Phone: [phone]   Fax:  [phone]  Physical Therapy Treatment  Patient Details  Name: Paul [name] MRN: [id number] Date of Birth: [birth year] Referring Provider: [name], MD  Encounter Date: 05/01/2015      PT End of Session - 05/01/15 0845    Visit Number 2   Number of Visits 15   Date for PT Re-Evaluation 07/21/15   Authorization Type [employer]   PT Start Time 0801   PT Stop Time 0845   PT Time Calculation (min) 44 min   Activity Tolerance Patient tolerated treatment well   Behavior During Therapy [facility] for tasks assessed/performed      Past Medical History  Diagnosis Date  . Open wound of both legs with complication     Pt has had progressive wounds of both LE's including gangrene of the toes and patchy necrosis of the calves.  He has been treated at [facility] by Dr Jerline Pain with hyperbaric O2 5d per week.  He is getting Na Thiosulfate with HD for suspected calciphylaxis. Pt says doctor's aren't sure if these ulcers were diabetic ulcers or calciphylaxis.  He underwent bilat BKA on 07/30/13.  He says that the woun  . ESRD on hemodialysis ([place])     Pt has ESRD due to DM.  He had a L upper arm AVF prior to starting HD which was ligated due to L arm steal syndrome.  He started HD in Jan 2014 and did home HD.  The family couldn't cannulate the R arm AVF successfully so by mid 2014 they decided to switch to PD which was done late summer 2014.  In Jan 2015 he was admitted with FTT felt to be due to underdialysis on PD and PD was abandoned and he   . Anemia     esrd  . Hypertension     off of meds due to orthostatic hypotension  . Closed left arm fracture 1967  . Heart murmur   . Corneal abrasion, left   . GERD (gastroesophageal reflux disease)   . History of blood transfusion   . Glomerulosclerosis, diabetic ([place])   . Depression   . A-fib ([place])   .  Calciphylaxis 05/2013    lower ext  . Diabetes mellitus     Type 1 iddm x 11 yrs  . Diabetic neuropathy ([place])   . Cancer ([place])     hx duodenal adenoCa 2002, resected    Past Surgical History  Procedure Laterality Date  . Whipple procedure  2002    duodenal ca, Dr. Harlow Asa  . Bone marrow biopsy  2012  . Renal biopsy, percutaneous  2012  . Tonsillectomy    . Portacath placement  2002  . Biliary diversion, external  2002  . Other surgical history      Cyst removed from back  . Av fistula placement  06/07/2011    Procedure: ARTERIOVENOUS (AV) FISTULA CREATION;  Surgeon: Angelia Mould, MD;  Location: [place];  Service: Vascular;  Laterality: Left;  . Ligation of arteriovenous  fistula  05/22/2012    Procedure: LIGATION OF ARTERIOVENOUS  FISTULA;  Surgeon: Mal Misty, MD;  Location: [place];  Service: Vascular;  Laterality: Left;  Left brachio-cephalic fistula  . Insertion of dialysis catheter  05/22/2012    Procedure: INSERTION OF DIALYSIS CATHETER;  Surgeon: Mal Misty, MD;  Location: [place];  Service: Vascular;  Laterality: N/A;  right  internal jugular vein  . Av fistula placement  06/18/2012    Procedure: ARTERIOVENOUS (AV) FISTULA CREATION;  Surgeon: Mal Misty, MD;  Location: [place];  Service: Vascular;  Laterality: Right;  [place]  . Colonoscopy      Hx: of  . Capd insertion N/A 01/14/2013    Procedure: LAPAROSCOPIC INSERTION CONTINUOUS AMBULATORY PERITONEAL DIALYSIS  (CAPD) CATHETER;  Surgeon: Adin Hector, MD;  Location: [place];  Service: General;  Laterality: N/A;  . Vascular surgery    . Removal of a dialysis catheter    . Capd removal N/A 07/09/2013    Procedure: CONTINUOUS AMBULATORY PERITONEAL DIALYSIS  (CAPD) CATHETER REMOVAL;  Surgeon: Adin Hector, MD;  Location: [place];  Service: General;  Laterality: N/A;  . Amputation Bilateral 07/30/2013    Procedure: AMPUTATION BELOW KNEE;  Surgeon: Newt Minion, MD;  Location: [place];  Service: Orthopedics;  Laterality:  Bilateral;  Bilateral Below Knee Amputations  . Amputation Bilateral 09/03/2013    Procedure: AMPUTATION BELOW KNEE;  Surgeon: Newt Minion, MD;  Location: [place];  Service: Orthopedics;  Laterality: Bilateral;  Bilateral Below Knee Amputation Revision  . Lower extremity angiogram N/A 06/14/2013    Procedure: LOWER EXTREMITY ANGIOGRAM;  Surgeon: Angelia Mould, MD;  Location: [facility] CATH LAB;  Service: Cardiovascular;  Laterality: N/A;  . Nephrectomy transplanted organ      There were no vitals filed for this visit.  Visit Diagnosis:  Abnormality of gait  Impaired functional mobility and activity tolerance  Weakness generalized  Balance problems  S/P bilateral BKA (below knee amputation) [facility])  Kidney transplant recipient      Subjective Assessment - 05/01/15 0802    Subjective No falls. He is ready to work to progress to next level.   Currently in Pain? No/denies                                 PT Education - 05/01/15 0845    Education provided Yes   Education Details updated HEP including components of fitness plan: goal of endurance is to increase to 20-55minutes of acivity prior to increasing intensity or speed; see pt instruction for step-ups at counter, standing green theraband exercises for UEs & LEs   Person(s) Educated Patient;Spouse   Methods Explanation;Demonstration;Tactile cues;Verbal cues;Handout   Comprehension Verbalized understanding;Returned demonstration;Verbal cues required;Tactile cues required;Need further instruction          PT Short Term Goals - 04/27/15 1400    PT SHORT TERM GOAL #1   Title Patient demonstrates understanding of updated HEP (Target Date: 05/26/2015)   Time 4   Period Weeks   Status New   PT SHORT TERM GOAL #2   Title Patient ambulates with prostheses only with head turns to scan environment maintaining path.  (Target Date: 05/26/2015)   Time 4   Period Weeks   Status New   PT SHORT TERM GOAL #3    Title Patient able to reach 10" anteriorly and pick up object from floor safely without UE support.  (Target Date: 05/26/2015)   Time 4   Period Weeks   Status New   PT SHORT TERM GOAL #4   Title Patient negotiates stairs with single rail safely with supervision.  (Target Date: 05/26/2015)   Time 4   Period Weeks   Status New           PT Long Term Goals - 04/27/15  1400    PT LONG TERM GOAL #1   Title Patient verbalizes & demonstrates understanding of ongoing fitness plan / HEP.  (Target Date: 07/21/2015)   Time 12   Period Weeks   Status New   PT LONG TERM GOAL #2   Title Berg Balance >45/56 to reduce risk of falls with standing activities. (Target Date: 07/21/2015)   Time 12   Period Weeks   Status New   PT LONG TERM GOAL #3   Title Functional Gait Assessment >20/30 to reduce fall risk with gait activities. (Target Date: 07/21/2015)   Time 12   Period Weeks   Status New   PT LONG TERM GOAL #4   Title Patient negotiates ramps & curbs without device and stairs with single rail only with prostheses modified independent. (Target Date: 07/21/2015)   Time 12   Period Weeks   Status New               Plan - 05/01/15 0845    Clinical Impression Statement Patient and wife appear to understand updated HEP. He was incorporating UEs, core & LEs with standing theraband exercises.   Pt will benefit from skilled therapeutic intervention in order to improve on the following deficits Abnormal gait;Decreased balance;Decreased endurance;Decreased mobility;Decreased strength;Prosthetic Dependency   Rehab Potential Good   PT Frequency Other (comment)  After eval, 2x/wk for 3 weeks, then 1x/wk for 9 weeks   PT Duration 12 weeks   PT Treatment/Interventions ADLs/Self Care Home Management;Gait training;Stair training;Functional mobility training;Therapeutic activities;Therapeutic exercise;Balance training;Neuromuscular re-education;Patient/family education;Prosthetic Training   PT Next Visit Plan  prosthetic gait including barriers   Consulted and Agree with Plan of Care Patient;Family member/caregiver   Family Member Consulted wife, [name]        Problem List Patient Active Problem List   Diagnosis Date Noted  . Abnormal cardiovascular stress test 12/29/2013  . Awaiting organ transplant 12/09/2013  . Leg ulcer ([place]) 11/17/2013  . Chronic kidney disease (CKD), stage V ([place]) 11/17/2013  . Diabetes ([place]) 11/17/2013  . Cancer of duodenum ([place]) 11/17/2013  . Dyslipidemia 11/17/2013  . Chronic kidney disease requiring chronic dialysis ([place]) 11/17/2013  . Hypercholesteremia 11/17/2013  . H/O malignant neoplasm 11/17/2013  . H/O: HTN (hypertension) 11/17/2013  . BP (high blood pressure) 11/17/2013  . Hypotension 11/17/2013  . Angiopathy, peripheral ([place]) 11/17/2013  . AF (paroxysmal atrial fibrillation) ([place]) 11/17/2013  . BKA stump complication ([place]) 58/01/9832  . S/P bilateral BKA (below knee amputation) ([place]) 08/03/2013  . S/P BKA (below knee amputation) bilateral ([place]) 07/30/2013  . Open wound of both legs with complication 82/50/5397  . Acute blood loss anemia 07/12/2013  . Syncope 07/09/2013  . Intolerance to CAPD peritoneal dialysis s/p CAPD cath removal 07/09/2013 07/05/2013  . Critical lower limb ischemia 06/30/2013  . Extremity pain 06/30/2013  . Atherosclerosis of native arteries of the extremities with ulceration(440.23) 06/17/2013  . Gait disorder 05/31/2013  . Weakness 05/24/2013  . Depression 04/03/2013  . GERD (gastroesophageal reflux disease) 04/03/2013  . Atrial fibrillation ([place]) 04/02/2013  . DM (diabetes mellitus) ([place]) 12/28/2012  . Other complications due to renal dialysis device, implant, and graft 05/19/2012  . Diabetes mellitus, type 2 ([place]) 11/13/2011  . Anemia 07/02/2011  . ESRD on hemodialysis ([place]) 05/29/2011    Drayton Tieu PT,  DPT 05/02/2015, 7:19 AM  [facility] [address]  [place], [place], [address] Phone: [phone]   Fax:  [phone]  Name: Paul [name] MRN: [id number]  Date of Birth: 01-04-1956

## 2015-05-04 ENCOUNTER — Encounter: Payer: Self-pay | Admitting: Physical Therapy

## 2015-05-04 ENCOUNTER — Ambulatory Visit: Payer: Managed Care, Other (non HMO) | Admitting: Physical Therapy

## 2015-05-04 DIAGNOSIS — R2689 Other abnormalities of gait and mobility: Secondary | ICD-10-CM

## 2015-05-04 DIAGNOSIS — R2681 Unsteadiness on feet: Secondary | ICD-10-CM

## 2015-05-04 DIAGNOSIS — R531 Weakness: Secondary | ICD-10-CM

## 2015-05-04 DIAGNOSIS — R269 Unspecified abnormalities of gait and mobility: Secondary | ICD-10-CM | POA: Diagnosis not present

## 2015-05-04 DIAGNOSIS — Z7409 Other reduced mobility: Secondary | ICD-10-CM

## 2015-05-04 DIAGNOSIS — R5381 Other malaise: Secondary | ICD-10-CM

## 2015-05-04 DIAGNOSIS — R6889 Other general symptoms and signs: Secondary | ICD-10-CM

## 2015-05-04 NOTE — Therapy (Signed)
[facility] [address] [name], [place], [address] Phone: [phone]   Fax:  [phone]  Physical Therapy Treatment  Patient Details  Name: Paul [name] MRN: [id number] Date of Birth: 1955-10-28 Referring Provider: [name], MD  Encounter Date: 05/04/2015      PT End of Session - 05/04/15 0851    Visit Number 3   Number of Visits 15   Date for PT Re-Evaluation 07/21/15   Authorization Type Cigna   PT Start Time 0848   PT Stop Time 0930   PT Time Calculation (min) 42 min   Equipment Utilized During Treatment Gait belt   Activity Tolerance Patient tolerated treatment well   Behavior During Therapy [facility] for tasks assessed/performed      Past Medical History  Diagnosis Date  . Open wound of both legs with complication     Pt has had progressive wounds of both LE's including gangrene of the toes and patchy necrosis of the calves.  He has been treated at [facility] by Dr Jerline Pain with hyperbaric O2 5d per week.  He is getting Na Thiosulfate with HD for suspected calciphylaxis. Pt says doctor's aren't sure if these ulcers were diabetic ulcers or calciphylaxis.  He underwent bilat BKA on 07/30/13.  He says that the woun  . ESRD on hemodialysis ([place])     Pt has ESRD due to DM.  He had a L upper arm AVF prior to starting HD which was ligated due to L arm steal syndrome.  He started HD in Jan 2014 and did home HD.  The family couldn't cannulate the R arm AVF successfully so by mid 2014 they decided to switch to PD which was done late summer 2014.  In Jan 2015 he was admitted with FTT felt to be due to underdialysis on PD and PD was abandoned and he   . Anemia     esrd  . Hypertension     off of meds due to orthostatic hypotension  . Closed left arm fracture 1967  . Heart murmur   . Corneal abrasion, left   . GERD (gastroesophageal reflux disease)   . History of blood transfusion   . Glomerulosclerosis, diabetic  ([name])   . Depression   . A-fib ([place])   . Calciphylaxis 05/2013    lower ext  . Diabetes mellitus     Type 1 iddm x 11 yrs  . Diabetic neuropathy ([place])   . Cancer ([place])     hx duodenal adenoCa 2002, resected    Past Surgical History  Procedure Laterality Date  . Whipple procedure  2002    duodenal ca, Dr. Harlow Asa  . Bone marrow biopsy  2012  . Renal biopsy, percutaneous  2012  . Tonsillectomy    . Portacath placement  2002  . Biliary diversion, external  2002  . Other surgical history      Cyst removed from back  . Av fistula placement  06/07/2011    Procedure: ARTERIOVENOUS (AV) FISTULA CREATION;  Surgeon: Angelia Mould, MD;  Location: [place];  Service: Vascular;  Laterality: Left;  . Ligation of arteriovenous  fistula  05/22/2012    Procedure: LIGATION OF ARTERIOVENOUS  FISTULA;  Surgeon: Mal Misty, MD;  Location: [place];  Service: Vascular;  Laterality: Left;  Left brachio-cephalic fistula  . Insertion of dialysis catheter  05/22/2012    Procedure: INSERTION OF DIALYSIS CATHETER;  Surgeon: Mal Misty, MD;  Location: [place];  Service: Vascular;  Laterality: N/A;  right internal jugular vein  . Av fistula placement  06/18/2012    Procedure: ARTERIOVENOUS (AV) FISTULA CREATION;  Surgeon: Mal Misty, MD;  Location: [place];  Service: Vascular;  Laterality: Right;  [place]  . Colonoscopy      Hx: of  . Capd insertion N/A 01/14/2013    Procedure: LAPAROSCOPIC INSERTION CONTINUOUS AMBULATORY PERITONEAL DIALYSIS  (CAPD) CATHETER;  Surgeon: Adin Hector, MD;  Location: [place];  Service: General;  Laterality: N/A;  . Vascular surgery    . Removal of a dialysis catheter    . Capd removal N/A 07/09/2013    Procedure: CONTINUOUS AMBULATORY PERITONEAL DIALYSIS  (CAPD) CATHETER REMOVAL;  Surgeon: Adin Hector, MD;  Location: [place];  Service: General;  Laterality: N/A;  . Amputation Bilateral 07/30/2013    Procedure: AMPUTATION BELOW KNEE;  Surgeon: Newt Minion, MD;  Location: [place];  Service: Orthopedics;  Laterality: Bilateral;  Bilateral Below Knee Amputations  . Amputation Bilateral 09/03/2013    Procedure: AMPUTATION BELOW KNEE;  Surgeon: Newt Minion, MD;  Location: [place];  Service: Orthopedics;  Laterality: Bilateral;  Bilateral Below Knee Amputation Revision  . Lower extremity angiogram N/A 06/14/2013    Procedure: LOWER EXTREMITY ANGIOGRAM;  Surgeon: Angelia Mould, MD;  Location: [facility] CATH LAB;  Service: Cardiovascular;  Laterality: N/A;  . Nephrectomy transplanted organ      There were no vitals filed for this visit.  Visit Diagnosis:  Abnormality of gait  Impaired functional mobility and activity tolerance  Weakness generalized  Balance problems  Unsteadiness  Debility  Activity intolerance      Subjective Assessment - 05/04/15 0851    Subjective No new complaints. No new falls to report. Denies pain.   Currently in Pain? No/denies           [facility] Adult PT Treatment/Exercise - 05/04/15 0856    Transfers   Sit to Stand 5: Supervision;Without upper extremity assist;From chair/3-in-1   Stand to Sit 5: Supervision;With upper extremity assist   Ambulation/Gait   Ambulation/Gait Yes   Ambulation/Gait Assistance 4: Min guard;5: Supervision   Ambulation/Gait Assistance Details cues on posture and to correct gait deviations   Ambulation Distance (Feet) 250 Feet  x 2 reps   Assistive device Prostheses   Gait Pattern Step-through pattern;Decreased stride length;Lateral hip instability;Trunk flexed;Wide base of support   Ambulation Surface Level;Indoor   Stairs Yes   Stairs Assistance 4: Min guard;5: Supervision   Stairs Assistance Details (indicate cue type and reason) cues on sequencing and hand advancement on rails during negotiatoin   Stair Management Technique Two rails;Alternating pattern;Forwards   Number of Stairs 4  x 7 reps   High Level Balance   High Level Balance Activities Marching forwards;Marching backwards;Negotiating  over obstacles   High Level Balance Comments in parallel bars: 3 laps each of marching each way with light UE support for balance. reciprocal stepping over hurddles x 3 laps with UE support on bars as well x 6 lap forward, x 4 laps sideways (2 laps towards each direction). cues on posture and weight shifting.                        Prosthetics   Current prosthetic wear tolerance (days/week)  7 days /wk   Current prosthetic wear tolerance (#hours/day)  all awake hours   Edema none   Residual limb condition  no issues  exercises:  In parallel bars with UE support as needed forward step up using 4 inch box: x 10 each leg with emphasis on a controlled descent. Lateral step ups using 4 inch box: x 10 each leg with emphasis on a controlled descent  At mat with min assist and cues on form/technique Sit<>stand with OH press using 2# weighted ball from high low mat at lowest setting, 2 sets of 10 reps.         PT Short Term Goals - 04/27/15 1400    PT SHORT TERM GOAL #1   Title Patient demonstrates understanding of updated HEP (Target Date: 05/26/2015)   Time 4   Period Weeks   Status New   PT SHORT TERM GOAL #2   Title Patient ambulates with prostheses only with head turns to scan environment maintaining path.  (Target Date: 05/26/2015)   Time 4   Period Weeks   Status New   PT SHORT TERM GOAL #3   Title Patient able to reach 10" anteriorly and pick up object from floor safely without UE support.  (Target Date: 05/26/2015)   Time 4   Period Weeks   Status New   PT SHORT TERM GOAL #4   Title Patient negotiates stairs with single rail safely with supervision.  (Target Date: 05/26/2015)   Time 4   Period Weeks   Status New           PT Long Term Goals - 04/27/15 1400    PT LONG TERM GOAL #1   Title Patient verbalizes & demonstrates understanding of ongoing fitness plan / HEP.  (Target Date: 07/21/2015)   Time 12   Period Weeks   Status New   PT LONG TERM GOAL #2   Title Berg  Balance >45/56 to reduce risk of falls with standing activities. (Target Date: 07/21/2015)   Time 12   Period Weeks   Status New   PT LONG TERM GOAL #3   Title Functional Gait Assessment >20/30 to reduce fall risk with gait activities. (Target Date: 07/21/2015)   Time 12   Period Weeks   Status New   PT LONG TERM GOAL #4   Title Patient negotiates ramps & curbs without device and stairs with single rail only with prostheses modified independent. (Target Date: 07/21/2015)   Time 12   Period Weeks   Status New            Plan - 05/04/15 1610    Clinical Impression Statement Continued to focus on strengthening and prosthetic gait. Utilized stairs for prosthetic instruction and strengthening with no issues reported. Pt making steady progress toward goals.   Pt will benefit from skilled therapeutic intervention in order to improve on the following deficits Abnormal gait;Decreased balance;Decreased endurance;Decreased mobility;Decreased strength;Prosthetic Dependency   Rehab Potential Good   PT Frequency Other (comment)  After eval, 2x/wk for 3 weeks, then 1x/wk for 9 weeks   PT Duration 12 weeks   PT Treatment/Interventions ADLs/Self Care Home Management;Gait training;Stair training;Functional mobility training;Therapeutic activities;Therapeutic exercise;Balance training;Neuromuscular re-education;Patient/family education;Prosthetic Training   PT Next Visit Plan prosthetic gait including barriers, strengthening and balance activities   Consulted and Agree with Plan of Care Patient   Family Member Consulted         Problem List Patient Active Problem List   Diagnosis Date Noted  . Abnormal cardiovascular stress test 12/29/2013  . Awaiting organ transplant 12/09/2013  . Leg ulcer ([place]) 11/17/2013  . Chronic kidney disease (CKD), stage V ([place])  11/17/2013  . Diabetes ([place]) 11/17/2013  . Cancer of duodenum ([place]) 11/17/2013  . Dyslipidemia 11/17/2013  . Chronic kidney disease requiring  chronic dialysis ([place]) 11/17/2013  . Hypercholesteremia 11/17/2013  . H/O malignant neoplasm 11/17/2013  . H/O: HTN (hypertension) 11/17/2013  . BP (high blood pressure) 11/17/2013  . Hypotension 11/17/2013  . Angiopathy, peripheral ([place]) 11/17/2013  . AF (paroxysmal atrial fibrillation) ([place]) 11/17/2013  . BKA stump complication ([place]) 88/30/1415  . S/P bilateral BKA (below knee amputation) ([place]) 08/03/2013  . S/P BKA (below knee amputation) bilateral ([place]) 07/30/2013  . Open wound of both legs with complication 97/33/1250  . Acute blood loss anemia 07/12/2013  . Syncope 07/09/2013  . Intolerance to CAPD peritoneal dialysis s/p CAPD cath removal 07/09/2013 07/05/2013  . Critical lower limb ischemia 06/30/2013  . Extremity pain 06/30/2013  . Atherosclerosis of native arteries of the extremities with ulceration(440.23) 06/17/2013  . Gait disorder 05/31/2013  . Weakness 05/24/2013  . Depression 04/03/2013  . GERD (gastroesophageal reflux disease) 04/03/2013  . Atrial fibrillation ([place]) 04/02/2013  . DM (diabetes mellitus) ([place]) 12/28/2012  . Other complications due to renal dialysis device, implant, and graft 05/19/2012  . Diabetes mellitus, type 2 ([place]) 11/13/2011  . Anemia 07/02/2011  . ESRD on hemodialysis [facility]) 05/29/2011    Willow Ora 05/05/2015, 3:27 PM  Willow Ora, PTA, [facility] [address], [place], [place] [address] [phone] 05/05/2015, 3:27 PM   Name: Paul [name] MRN: [id number] Date of Birth: 08/06/1955

## 2015-05-08 ENCOUNTER — Encounter: Payer: Self-pay | Admitting: Physical Therapy

## 2015-05-08 ENCOUNTER — Ambulatory Visit: Payer: Managed Care, Other (non HMO) | Admitting: Physical Therapy

## 2015-05-08 DIAGNOSIS — Z7409 Other reduced mobility: Secondary | ICD-10-CM

## 2015-05-08 DIAGNOSIS — R269 Unspecified abnormalities of gait and mobility: Secondary | ICD-10-CM

## 2015-05-08 DIAGNOSIS — R5381 Other malaise: Secondary | ICD-10-CM

## 2015-05-08 DIAGNOSIS — R531 Weakness: Secondary | ICD-10-CM

## 2015-05-08 DIAGNOSIS — R6889 Other general symptoms and signs: Secondary | ICD-10-CM

## 2015-05-08 DIAGNOSIS — R2689 Other abnormalities of gait and mobility: Secondary | ICD-10-CM

## 2015-05-08 DIAGNOSIS — R2681 Unsteadiness on feet: Secondary | ICD-10-CM

## 2015-05-10 NOTE — Therapy (Signed)
[facility] [address] [place], [place], [address] Phone: [phone]   Fax:  [phone]  Physical Therapy Treatment  Patient Details  Name: Paul [name] MRN: [id number] Date of Birth: 09/10/1955 Referring Provider: [name], MD  Encounter Date: 05/08/2015   05/08/15 1535  PT Visits / Re-Eval  Visit Number 4  Number of Visits 15  Date for PT Re-Evaluation 07/21/15  Authorization  Authorization Type [employer]  PT Time Calculation  PT Start Time 1534  PT Stop Time 1615  PT Time Calculation (min) 41 min  PT - End of Session  Equipment Utilized During Treatment Gait belt  Activity Tolerance Patient tolerated treatment well  Behavior During Therapy [facility] for tasks assessed/performed     Past Medical History  Diagnosis Date  . Open wound of both legs with complication     Pt has had progressive wounds of both LE's including gangrene of the toes and patchy necrosis of the calves.  He has been treated at [facility] by Dr Jerline Pain with hyperbaric O2 5d per week.  He is getting Na Thiosulfate with HD for suspected calciphylaxis. Pt says doctor's aren't sure if these ulcers were diabetic ulcers or calciphylaxis.  He underwent bilat BKA on 07/30/13.  He says that the woun  . ESRD on hemodialysis ([place])     Pt has ESRD due to DM.  He had a L upper arm AVF prior to starting HD which was ligated due to L arm steal syndrome.  He started HD in Jan 2014 and did home HD.  The family couldn't cannulate the R arm AVF successfully so by mid 2014 they decided to switch to PD which was done late summer 2014.  In Jan 2015 he was admitted with FTT felt to be due to underdialysis on PD and PD was abandoned and he   . Anemia     esrd  . Hypertension     off of meds due to orthostatic hypotension  . Closed left arm fracture 1967  . Heart murmur   . Corneal abrasion, left   . GERD (gastroesophageal reflux disease)   . History of blood  transfusion   . Glomerulosclerosis, diabetic ([place])   . Depression   . A-fib ([place])   . Calciphylaxis 05/2013    lower ext  . Diabetes mellitus     Type 1 iddm x 11 yrs  . Diabetic neuropathy ([place])   . Cancer ([place])     hx duodenal adenoCa 2002, resected    Past Surgical History  Procedure Laterality Date  . Whipple procedure  2002    duodenal ca, Dr. Harlow Asa  . Bone marrow biopsy  2012  . Renal biopsy, percutaneous  2012  . Tonsillectomy    . Portacath placement  2002  . Biliary diversion, external  2002  . Other surgical history      Cyst removed from back  . Av fistula placement  06/07/2011    Procedure: ARTERIOVENOUS (AV) FISTULA CREATION;  Surgeon: Angelia Mould, MD;  Location: [facility];  Service: Vascular;  Laterality: Left;  . Ligation of arteriovenous  fistula  05/22/2012    Procedure: LIGATION OF ARTERIOVENOUS  FISTULA;  Surgeon: Mal Misty, MD;  Location: [facility];  Service: Vascular;  Laterality: Left;  Left brachio-cephalic fistula  . Insertion of dialysis catheter  05/22/2012    Procedure: INSERTION OF DIALYSIS CATHETER;  Surgeon: Mal Misty, MD;  Location: [facility];  Service: Vascular;  Laterality: N/A;  right internal jugular vein  . Av fistula placement  06/18/2012    Procedure: ARTERIOVENOUS (AV) FISTULA CREATION;  Surgeon: Mal Misty, MD;  Location: [facility];  Service: Vascular;  Laterality: Right;  [place]  . Colonoscopy      Hx: of  . Capd insertion N/A 01/14/2013    Procedure: LAPAROSCOPIC INSERTION CONTINUOUS AMBULATORY PERITONEAL DIALYSIS  (CAPD) CATHETER;  Surgeon: Adin Hector, MD;  Location: [facility];  Service: General;  Laterality: N/A;  . Vascular surgery    . Removal of a dialysis catheter    . Capd removal N/A 07/09/2013    Procedure: CONTINUOUS AMBULATORY PERITONEAL DIALYSIS  (CAPD) CATHETER REMOVAL;  Surgeon: Adin Hector, MD;  Location: [facility];  Service: General;  Laterality: N/A;  . Amputation Bilateral 07/30/2013    Procedure: AMPUTATION BELOW  KNEE;  Surgeon: Newt Minion, MD;  Location: [facility];  Service: Orthopedics;  Laterality: Bilateral;  Bilateral Below Knee Amputations  . Amputation Bilateral 09/03/2013    Procedure: AMPUTATION BELOW KNEE;  Surgeon: Newt Minion, MD;  Location: [facility];  Service: Orthopedics;  Laterality: Bilateral;  Bilateral Below Knee Amputation Revision  . Lower extremity angiogram N/A 06/14/2013    Procedure: LOWER EXTREMITY ANGIOGRAM;  Surgeon: Angelia Mould, MD;  Location[facility] CATH LAB;  Service: Cardiovascular;  Laterality: N/A;  . Nephrectomy transplanted organ      There were no vitals filed for this visit.  Visit Diagnosis:  Abnormality of gait  Impaired functional mobility and activity tolerance  Weakness generalized  Balance problems  Unsteadiness  Debility  Activity intolerance     05/08/15 1534  Symptoms/Limitations  Subjective No new complaints. No new falls to report. Denies pain.  Pain Assessment  Currently in Pain? No/denies      05/08/15 1536  Ambulation/Gait  Ambulation/Gait Yes  Ambulation/Gait Assistance 5: Supervision  Ambulation/Gait Assistance Details cues on posture, step/stride length and base of support  Ambulation Distance (Feet) 250 Feet (x2)  Assistive device Prostheses  Gait Pattern Step-through pattern;Decreased stride length;Lateral hip instability;Trunk flexed;Wide base of support  Ambulation Surface Level;Indoor  Stairs Yes  Stairs Assistance 4: Min guard;5: Supervision  Stairs Assistance Details (indicate cue type and reason) cues on foot placement with descent to allow for increase knee flexion; 4 reps x 2 performed after each gait trial for circuit strengthening training and for prosthetic education                         Stair Management Technique Two rails;Alternating pattern;Forwards  Number of Stairs 4 (x 8 reps)  Ramp 5: Supervision  Ramp Details (indicate cue type and reason) outdoor ramps with cues on posture and technique  Curb 4:  Min assist  Curb Details (indicate cue type and reason) blocked practice with outdoor curb, cues on sequence and technique. increased assist needed with descent vs ascent                                 Neuro Re-ed   Neuro Re-ed Details  balance board in parallel bars (activiites performed both ways on board): static hold no UE support, rocking with emphasis on upright posture, alternating UE raises, bil UE raises, eyes closed with emphasis on static hold. min guard to min assist for balance with intermittent UE support on bars for balance.  PT Short Term Goals - 04/27/15 1400    PT SHORT TERM GOAL #1   Title Patient demonstrates understanding of updated HEP (Target Date: 05/26/2015)   Time 4   Period Weeks   Status New   PT SHORT TERM GOAL #2   Title Patient ambulates with prostheses only with head turns to scan environment maintaining path.  (Target Date: 05/26/2015)   Time 4   Period Weeks   Status New   PT SHORT TERM GOAL #3   Title Patient able to reach 10" anteriorly and pick up object from floor safely without UE support.  (Target Date: 05/26/2015)   Time 4   Period Weeks   Status New   PT SHORT TERM GOAL #4   Title Patient negotiates stairs with single rail safely with supervision.  (Target Date: 05/26/2015)   Time 4   Period Weeks   Status New           PT Long Term Goals - 04/27/15 1400    PT LONG TERM GOAL #1   Title Patient verbalizes & demonstrates understanding of ongoing fitness plan / HEP.  (Target Date: 07/21/2015)   Time 12   Period Weeks   Status New   PT LONG TERM GOAL #2   Title Berg Balance >45/56 to reduce risk of falls with standing activities. (Target Date: 07/21/2015)   Time 12   Period Weeks   Status New   PT LONG TERM GOAL #3   Title Functional Gait Assessment >20/30 to reduce fall risk with gait activities. (Target Date: 07/21/2015)   Time 12   Period  Weeks   Status New   PT LONG TERM GOAL #4   Title Patient negotiates ramps & curbs without device and stairs with single rail only with prostheses modified independent. (Target Date: 07/21/2015)   Time 12   Period Weeks   Status New        05/08/15 1535  Plan  Clinical Impression Statement Skilled session focused on prosthetic training with gait and barriers with no increase in pain reported. Also worked on balance reactions today without issues reported. Pt is making steady progress toward goals.  Pt will benefit from skilled therapeutic intervention in order to improve on the following deficits Abnormal gait;Decreased balance;Decreased endurance;Decreased mobility;Decreased strength;Prosthetic Dependency  Rehab Potential Good  PT Frequency Other (comment) (After eval, 2x/wk for 3 weeks, then 1x/wk for 9 weeks)  PT Duration 12 weeks  PT Treatment/Interventions ADLs/Self Care Home Management;Gait training;Stair training;Functional mobility training;Therapeutic activities;Therapeutic exercise;Balance training;Neuromuscular re-education;Patient/family education;Prosthetic Training  PT Next Visit Plan prosthetic gait including barriers, strengthening and balance activities  Consulted and Agree with Plan of Care Patient    Problem List Patient Active Problem List   Diagnosis Date Noted  . Abnormal cardiovascular stress test 12/29/2013  . Awaiting organ transplant 12/09/2013  . Leg ulcer ([place]) 11/17/2013  . Chronic kidney disease (CKD), stage V ([place]) 11/17/2013  . Diabetes ([place]) 11/17/2013  . Cancer of duodenum ([place]) 11/17/2013  . Dyslipidemia 11/17/2013  . Chronic kidney disease requiring chronic dialysis ([place]) 11/17/2013  . Hypercholesteremia 11/17/2013  . H/O malignant neoplasm 11/17/2013  . H/O: HTN (hypertension) 11/17/2013  . BP (high blood pressure) 11/17/2013  . Hypotension 11/17/2013  . Angiopathy, peripheral ([place]) 11/17/2013  . AF (paroxysmal atrial fibrillation) ([place])  11/17/2013  . BKA stump complication ([place]) 19/62/2297  . S/P bilateral BKA (below knee amputation) ([place]) 08/03/2013  . S/P BKA (below knee amputation) bilateral ([place]) 07/30/2013  . Open  wound of both legs with complication 10/62/6948  . Acute blood loss anemia 07/12/2013  . Syncope 07/09/2013  . Intolerance to CAPD peritoneal dialysis s/p CAPD cath removal 07/09/2013 07/05/2013  . Critical lower limb ischemia 06/30/2013  . Extremity pain 06/30/2013  . Atherosclerosis of native arteries of the extremities with ulceration(440.23) 06/17/2013  . Gait disorder 05/31/2013  . Weakness 05/24/2013  . Depression 04/03/2013  . GERD (gastroesophageal reflux disease) 04/03/2013  . Atrial fibrillation ([place]) 04/02/2013  . DM (diabetes mellitus) ([place]) 12/28/2012  . Other complications due to renal dialysis device, implant, and graft 05/19/2012  . Diabetes mellitus, type 2 ([place]) 11/13/2011  . Anemia 07/02/2011  . ESRD on hemodialysis [facility]) 05/29/2011    Willow Ora 05/10/2015, 11:17 PM  Willow Ora, PTA, [facility] [address] [place], [place] [address] [phone] 05/10/2015, 11:36 PM   Name: Paul [name] MRN: [id number] Date of Birth: 1956-03-09

## 2015-05-11 ENCOUNTER — Encounter: Payer: Self-pay | Admitting: Physical Therapy

## 2015-05-11 ENCOUNTER — Ambulatory Visit: Payer: Managed Care, Other (non HMO) | Admitting: Physical Therapy

## 2015-05-11 DIAGNOSIS — Z89512 Acquired absence of left leg below knee: Secondary | ICD-10-CM

## 2015-05-11 DIAGNOSIS — Z940 Kidney transplant status: Secondary | ICD-10-CM

## 2015-05-11 DIAGNOSIS — R531 Weakness: Secondary | ICD-10-CM

## 2015-05-11 DIAGNOSIS — R269 Unspecified abnormalities of gait and mobility: Secondary | ICD-10-CM

## 2015-05-11 DIAGNOSIS — R2689 Other abnormalities of gait and mobility: Secondary | ICD-10-CM

## 2015-05-11 DIAGNOSIS — R2681 Unsteadiness on feet: Secondary | ICD-10-CM

## 2015-05-11 DIAGNOSIS — Z7409 Other reduced mobility: Secondary | ICD-10-CM

## 2015-05-11 DIAGNOSIS — Z89511 Acquired absence of right leg below knee: Secondary | ICD-10-CM

## 2015-05-11 NOTE — Therapy (Signed)
[facility] [address] [place], [place], [address] Phone: [phone]   Fax:  [phone]  Physical Therapy Treatment  Patient Details  Name: Paul [name] MRN: [id number] Date of Birth: [birth year] Referring Provider: [name], MD  Encounter Date: 05/11/2015      PT End of Session - 05/11/15 1705    Visit Number 5   Number of Visits 15   Date for PT Re-Evaluation 07/21/15   Authorization Type Cigna   PT Start Time 1615   PT Stop Time 1705   PT Time Calculation (min) 50 min   Equipment Utilized During Treatment Gait belt   Activity Tolerance Patient tolerated treatment well   Behavior During Therapy [facility] for tasks assessed/performed      Past Medical History  Diagnosis Date  . Open wound of both legs with complication     Pt has had progressive wounds of both LE's including gangrene of the toes and patchy necrosis of the calves.  He has been treated at [facility] by Dr Jerline Pain with hyperbaric O2 5d per week.  He is getting Na Thiosulfate with HD for suspected calciphylaxis. Pt says doctor's aren't sure if these ulcers were diabetic ulcers or calciphylaxis.  He underwent bilat BKA on 07/30/13.  He says that the woun  . ESRD on hemodialysis ([place])     Pt has ESRD due to DM.  He had a L upper arm AVF prior to starting HD which was ligated due to L arm steal syndrome.  He started HD in Jan 2014 and did home HD.  The family couldn't cannulate the R arm AVF successfully so by mid 2014 they decided to switch to PD which was done late summer 2014.  In Jan 2015 he was admitted with FTT felt to be due to underdialysis on PD and PD was abandoned and he   . Anemia     esrd  . Hypertension     off of meds due to orthostatic hypotension  . Closed left arm fracture 1967  . Heart murmur   . Corneal abrasion, left   . GERD (gastroesophageal reflux disease)   . History of blood transfusion   . Glomerulosclerosis, diabetic  ([place])   . Depression   . A-fib ([place])   . Calciphylaxis 05/2013    lower ext  . Diabetes mellitus     Type 1 iddm x 11 yrs  . Diabetic neuropathy ([place])   . Cancer ([place])     hx duodenal adenoCa 2002, resected    Past Surgical History  Procedure Laterality Date  . Whipple procedure  2002    duodenal ca, Dr. Harlow Asa  . Bone marrow biopsy  2012  . Renal biopsy, percutaneous  2012  . Tonsillectomy    . Portacath placement  2002  . Biliary diversion, external  2002  . Other surgical history      Cyst removed from back  . Av fistula placement  06/07/2011    Procedure: ARTERIOVENOUS (AV) FISTULA CREATION;  Surgeon: Angelia Mould, MD;  Location: [facility];  Service: Vascular;  Laterality: Left;  . Ligation of arteriovenous  fistula  05/22/2012    Procedure: LIGATION OF ARTERIOVENOUS  FISTULA;  Surgeon: Mal Misty, MD;  Location: [facility];  Service: Vascular;  Laterality: Left;  Left brachio-cephalic fistula  . Insertion of dialysis catheter  05/22/2012    Procedure: INSERTION OF DIALYSIS CATHETER;  Surgeon: Mal Misty, MD;  Location: [facility];  Service: Vascular;  Laterality: N/A;  right internal jugular vein  . Av fistula placement  06/18/2012    Procedure: ARTERIOVENOUS (AV) FISTULA CREATION;  Surgeon: Mal Misty, MD;  Location: [facility];  Service: Vascular;  Laterality: Right;  [place]  . Colonoscopy      Hx: of  . Capd insertion N/A 01/14/2013    Procedure: LAPAROSCOPIC INSERTION CONTINUOUS AMBULATORY PERITONEAL DIALYSIS  (CAPD) CATHETER;  Surgeon: Adin Hector, MD;  Location: [facility];  Service: General;  Laterality: N/A;  . Vascular surgery    . Removal of a dialysis catheter    . Capd removal N/A 07/09/2013    Procedure: CONTINUOUS AMBULATORY PERITONEAL DIALYSIS  (CAPD) CATHETER REMOVAL;  Surgeon: Adin Hector, MD;  Location: [facility];  Service: General;  Laterality: N/A;  . Amputation Bilateral 07/30/2013    Procedure: AMPUTATION BELOW KNEE;  Surgeon: Newt Minion, MD;  Location: [facility];  Service: Orthopedics;  Laterality: Bilateral;  Bilateral Below Knee Amputations  . Amputation Bilateral 09/03/2013    Procedure: AMPUTATION BELOW KNEE;  Surgeon: Newt Minion, MD;  Location: [facility];  Service: Orthopedics;  Laterality: Bilateral;  Bilateral Below Knee Amputation Revision  . Lower extremity angiogram N/A 06/14/2013    Procedure: LOWER EXTREMITY ANGIOGRAM;  Surgeon: Angelia Mould, MD;  Location: [facility] CATH LAB;  Service: Cardiovascular;  Laterality: N/A;  . Nephrectomy transplanted organ      There were no vitals filed for this visit.  Visit Diagnosis:  Abnormality of gait  Impaired functional mobility and activity tolerance  Weakness generalized  Balance problems  Unsteadiness  S/P bilateral BKA (below knee amputation) ([place])  Kidney transplant recipient      Subjective Assessment - 05/11/15 1615    Subjective He lost balance turning in copy room but able to catch himself with counter.    Currently in Pain? No/denies                         [facility] Adult PT Treatment/Exercise - 05/11/15 1615    Transfers   Transfers Sit to Stand;Stand to Sit   Sit to Stand 5: Supervision;With upper extremity assist;With armrests;From chair/3-in-1   Stand to Sit 5: Supervision;With upper extremity assist;With armrests;To chair/3-in-1   Ambulation/Gait   Ambulation/Gait Yes   Ambulation/Gait Assistance 5: Supervision   Ambulation/Gait Assistance Details high level activiities turning 90*, 180*, scanning, increasing speed & low vision   Assistive device Prostheses   Ambulation Surface Indoor;Level   Stairs Yes   Stairs Assistance 5: Supervision   Stairs Assistance Details (indicate cue type and reason) verbal & demo cues on wt shift and timing of knee flexion with descent   Stair Management Technique Two rails;Alternating pattern;Forwards;One rail Right  2 rails, then right rail/ left wall support   Number of Stairs 4  10 reps   High Level Balance    High Level Balance Activities Direction changes;Turns;Tandem walking;Weight-shifting turns  tandem only with counter support   High Level Balance Comments verbal & visual cues for proper wt shift             Balance Exercises - 05/11/15 1615    Balance Exercises: Standing   Gait with Head Turns Forward;5 reps  head turns right/left, up/down & diaganols   Tandem Gait Forward;Upper extremity support;5 reps   Retro Gait 4 reps  tactile cues   Turning Right;Left             PT  Short Term Goals - 04/27/15 1400    PT SHORT TERM GOAL #1   Title Patient demonstrates understanding of updated HEP (Target Date: 05/26/2015)   Time 4   Period Weeks   Status New   PT SHORT TERM GOAL #2   Title Patient ambulates with prostheses only with head turns to scan environment maintaining path.  (Target Date: 05/26/2015)   Time 4   Period Weeks   Status New   PT SHORT TERM GOAL #3   Title Patient able to reach 10" anteriorly and pick up object from floor safely without UE support.  (Target Date: 05/26/2015)   Time 4   Period Weeks   Status New   PT SHORT TERM GOAL #4   Title Patient negotiates stairs with single rail safely with supervision.  (Target Date: 05/26/2015)   Time 4   Period Weeks   Status New           PT Long Term Goals - 04/27/15 1400    PT LONG TERM GOAL #1   Title Patient verbalizes & demonstrates understanding of ongoing fitness plan / HEP.  (Target Date: 07/21/2015)   Time 12   Period Weeks   Status New   PT LONG TERM GOAL #2   Title Berg Balance >45/56 to reduce risk of falls with standing activities. (Target Date: 07/21/2015)   Time 12   Period Weeks   Status New   PT LONG TERM GOAL #3   Title Functional Gait Assessment >20/30 to reduce fall risk with gait activities. (Target Date: 07/21/2015)   Time 12   Period Weeks   Status New   PT LONG TERM GOAL #4   Title Patient negotiates ramps & curbs without device and stairs with single rail only with prostheses  modified independent. (Target Date: 07/21/2015)   Time 12   Period Weeks   Status New               Plan - 05/11/15 1705    Clinical Impression Statement Patient improved wt shifting turns 90* & 180* with initial visual cues on colored tiles and progressed to supervision. Patient improved control on stairs and was able to decrease UE assist from 2 rails to 1 rail & wall. Patient is on target to meet STGs.    Pt will benefit from skilled therapeutic intervention in order to improve on the following deficits Abnormal gait;Decreased balance;Decreased endurance;Decreased mobility;Decreased strength;Prosthetic Dependency   Rehab Potential Good   PT Frequency Other (comment)  After eval, 2x/wk for 3 weeks, then 1x/wk for 9 weeks   PT Duration 12 weeks   PT Treatment/Interventions ADLs/Self Care Home Management;Gait training;Stair training;Functional mobility training;Therapeutic activities;Therapeutic exercise;Balance training;Neuromuscular re-education;Patient/family education;Prosthetic Training   PT Next Visit Plan prosthetic gait including barriers, strengthening and balance activities   Consulted and Agree with Plan of Care Patient        Problem List Patient Active Problem List   Diagnosis Date Noted  . Abnormal cardiovascular stress test 12/29/2013  . Awaiting organ transplant 12/09/2013  . Leg ulcer ([facility]) 11/17/2013  . Chronic kidney disease (CKD), stage V ([facility]) 11/17/2013  . Diabetes ([facility]) 11/17/2013  . Cancer of duodenum ([facility]) 11/17/2013  . Dyslipidemia 11/17/2013  . Chronic kidney disease requiring chronic dialysis ([facility]) 11/17/2013  . Hypercholesteremia 11/17/2013  . H/O malignant neoplasm 11/17/2013  . H/O: HTN (hypertension) 11/17/2013  . BP (high blood pressure) 11/17/2013  . Hypotension 11/17/2013  . Angiopathy, peripheral ([facility]) 11/17/2013  . AF (paroxysmal  atrial fibrillation) ([place]) 11/17/2013  . BKA stump complication ([place]) 97/47/1855  . S/P bilateral BKA  (below knee amputation) ([place]) 08/03/2013  . S/P BKA (below knee amputation) bilateral ([place]) 07/30/2013  . Open wound of both legs with complication 01/58/6825  . Acute blood loss anemia 07/12/2013  . Syncope 07/09/2013  . Intolerance to CAPD peritoneal dialysis s/p CAPD cath removal 07/09/2013 07/05/2013  . Critical lower limb ischemia 06/30/2013  . Extremity pain 06/30/2013  . Atherosclerosis of native arteries of the extremities with ulceration(440.23) 06/17/2013  . Gait disorder 05/31/2013  . Weakness 05/24/2013  . Depression 04/03/2013  . GERD (gastroesophageal reflux disease) 04/03/2013  . Atrial fibrillation ([place]) 04/02/2013  . DM (diabetes mellitus) ([place]) 12/28/2012  . Other complications due to renal dialysis device, implant, and graft 05/19/2012  . Diabetes mellitus, type 2 ([place]) 11/13/2011  . Anemia 07/02/2011  . ESRD on hemodialysis ([place]) 05/29/2011    Dene Nazir PT, DPT 05/11/2015, 5:59 PM  [facility] [address] [place], [place], [address] Phone: [phone]   Fax:  [phone]  Name: BAYLEE MCCORKEL MRN: [id number] Date of Birth: 04-26-1956

## 2015-05-17 ENCOUNTER — Ambulatory Visit: Payer: Managed Care, Other (non HMO) | Admitting: Physical Therapy

## 2015-05-17 ENCOUNTER — Encounter: Payer: Self-pay | Admitting: Physical Therapy

## 2015-05-17 DIAGNOSIS — R5381 Other malaise: Secondary | ICD-10-CM

## 2015-05-17 DIAGNOSIS — R269 Unspecified abnormalities of gait and mobility: Secondary | ICD-10-CM | POA: Diagnosis not present

## 2015-05-17 DIAGNOSIS — R2689 Other abnormalities of gait and mobility: Secondary | ICD-10-CM

## 2015-05-17 DIAGNOSIS — R6889 Other general symptoms and signs: Secondary | ICD-10-CM

## 2015-05-17 DIAGNOSIS — R2991 Unspecified symptoms and signs involving the musculoskeletal system: Secondary | ICD-10-CM

## 2015-05-17 DIAGNOSIS — R2681 Unsteadiness on feet: Secondary | ICD-10-CM

## 2015-05-17 DIAGNOSIS — R531 Weakness: Secondary | ICD-10-CM

## 2015-05-17 DIAGNOSIS — Z7409 Other reduced mobility: Secondary | ICD-10-CM

## 2015-05-17 NOTE — Therapy (Signed)
[facility] [address] [place], [place], [address] Phone: [phone]   Fax:  [phone]  Physical Therapy Treatment  Patient Details  Name: Paul [name] MRN: [id number] Date of Birth: February 28, 1956 Referring Provider: [name], MD  Encounter Date: 05/17/2015      PT End of Session - 05/17/15 1520    Visit Number 6   Number of Visits 15   Date for PT Re-Evaluation 07/21/15   Authorization Type [employer]   PT Start Time 1515   PT Stop Time 1557   PT Time Calculation (min) 42 min   Equipment Utilized During Treatment Gait belt   Activity Tolerance Patient tolerated treatment well   Behavior During Therapy [facility] for tasks assessed/performed      Past Medical History  Diagnosis Date  . Open wound of both legs with complication     Pt has had progressive wounds of both LE's including gangrene of the toes and patchy necrosis of the calves.  He has been treated at [facility] by Dr Jerline Pain with hyperbaric O2 5d per week.  He is getting Na Thiosulfate with HD for suspected calciphylaxis. Pt says doctor's aren't sure if these ulcers were diabetic ulcers or calciphylaxis.  He underwent bilat BKA on 07/30/13.  He says that the woun  . ESRD on hemodialysis ([place])     Pt has ESRD due to DM.  He had a L upper arm AVF prior to starting HD which was ligated due to L arm steal syndrome.  He started HD in Jan 2014 and did home HD.  The family couldn't cannulate the R arm AVF successfully so by mid 2014 they decided to switch to PD which was done late summer 2014.  In Jan 2015 he was admitted with FTT felt to be due to underdialysis on PD and PD was abandoned and he   . Anemia     esrd  . Hypertension     off of meds due to orthostatic hypotension  . Closed left arm fracture 1967  . Heart murmur   . Corneal abrasion, left   . GERD (gastroesophageal reflux disease)   . History of blood transfusion   . Glomerulosclerosis, diabetic  ([place])   . Depression   . A-fib ([place])   . Calciphylaxis 05/2013    lower ext  . Diabetes mellitus     Type 1 iddm x 11 yrs  . Diabetic neuropathy ([place])   . Cancer ([place])     hx duodenal adenoCa 2002, resected    Past Surgical History  Procedure Laterality Date  . Whipple procedure  2002    duodenal ca, Dr. Harlow Asa  . Bone marrow biopsy  2012  . Renal biopsy, percutaneous  2012  . Tonsillectomy    . Portacath placement  2002  . Biliary diversion, external  2002  . Other surgical history      Cyst removed from back  . Av fistula placement  06/07/2011    Procedure: ARTERIOVENOUS (AV) FISTULA CREATION;  Surgeon: Angelia Mould, MD;  Location: [facility];  Service: Vascular;  Laterality: Left;  . Ligation of arteriovenous  fistula  05/22/2012    Procedure: LIGATION OF ARTERIOVENOUS  FISTULA;  Surgeon: Mal Misty, MD;  Location: [facility];  Service: Vascular;  Laterality: Left;  Left brachio-cephalic fistula  . Insertion of dialysis catheter  05/22/2012    Procedure: INSERTION OF DIALYSIS CATHETER;  Surgeon: Mal Misty, MD;  Location: [facility];  Service: Vascular;  Laterality: N/A;  right internal jugular vein  . Av fistula placement  06/18/2012    Procedure: ARTERIOVENOUS (AV) FISTULA CREATION;  Surgeon: Mal Misty, MD;  Location: [facility];  Service: Vascular;  Laterality: Right;  [place]  . Colonoscopy      Hx: of  . Capd insertion N/A 01/14/2013    Procedure: LAPAROSCOPIC INSERTION CONTINUOUS AMBULATORY PERITONEAL DIALYSIS  (CAPD) CATHETER;  Surgeon: Adin Hector, MD;  Location: [facility];  Service: General;  Laterality: N/A;  . Vascular surgery    . Removal of a dialysis catheter    . Capd removal N/A 07/09/2013    Procedure: CONTINUOUS AMBULATORY PERITONEAL DIALYSIS  (CAPD) CATHETER REMOVAL;  Surgeon: Adin Hector, MD;  Location: [facility];  Service: General;  Laterality: N/A;  . Amputation Bilateral 07/30/2013    Procedure: AMPUTATION BELOW KNEE;  Surgeon: Newt Minion, MD;  Location: [facility];  Service: Orthopedics;  Laterality: Bilateral;  Bilateral Below Knee Amputations  . Amputation Bilateral 09/03/2013    Procedure: AMPUTATION BELOW KNEE;  Surgeon: Newt Minion, MD;  Location: [facility];  Service: Orthopedics;  Laterality: Bilateral;  Bilateral Below Knee Amputation Revision  . Lower extremity angiogram N/A 06/14/2013    Procedure: LOWER EXTREMITY ANGIOGRAM;  Surgeon: Angelia Mould, MD;  Location: [facility] CATH LAB;  Service: Cardiovascular;  Laterality: N/A;  . Nephrectomy transplanted organ      There were no vitals filed for this visit.  Visit Diagnosis:  Abnormality of gait  Impaired functional mobility and activity tolerance  Weakness generalized  Balance problems  Unsteadiness  Debility  Activity intolerance  Impaired transfers      Subjective Assessment - 05/17/15 1518    Subjective No new compaints. No falls, had one near fall this am with toe scuffing on carpet. Was able to recover balance. No pain to report.   Patient is accompained by: Family member  spouse   Patient Stated Goals To walk up/down stairs with 1 rail and no device, negotiate curbs better.  stand & balance on one foot, Walk without waddling   Currently in Pain? No/denies           [facility] Adult PT Treatment/Exercise - 05/17/15 1521    Transfers   Transfers Sit to Stand;Stand to Sit   Sit to Stand 5: Supervision;With upper extremity assist;With armrests;From chair/3-in-1   Stand to Sit 5: Supervision;With upper extremity assist;With armrests;To chair/3-in-1   Ambulation/Gait   Ambulation/Gait Yes   Ambulation/Gait Assistance 5: Supervision   Ambulation/Gait Assistance Details worked on increased gait speed with gait laps/reps. performed circuit style training for balance and activity tolerance (gait laps>stairs>gait laps x 3 reps). cues on posture and to correct gait deviations with gait.                                      Ambulation Distance (Feet) 230 Feet  x 3 reps with  stairs performed between reps   Assistive device Prostheses   Gait Pattern Step-through pattern;Decreased stride length;Lateral hip instability;Trunk flexed;Wide base of support   Ambulation Surface Level;Indoor   Stairs Assistance 5: Supervision   Stairs Assistance Details (indicate cue type and reason) cues on foot position and weight shifting with stair negotiation. Pt progressively decreased the amount of weight/pressure being applied through arms on rails  Stair Management Technique Two rails;Alternating pattern;Forwards;One rail Right   Number of Stairs 4  5 reps x 3 sets (performed 5 reps between each gait set   High Level Balance   High Level Balance Activities Marching forwards;Marching backwards;Tandem walking   High Level Balance Comments in parallel bars with them only used with tandem gait. 3 laps each way with marching and 7 laps forward with tandem walking. Cues on posture and weight shifting with stance. Cues to ensure stance stability before advancing the next leg.                           Prosthetics   Current prosthetic wear tolerance (days/week)  7 days /wk   Current prosthetic wear tolerance (#hours/day)  all awake hours   Edema none   Residual limb condition  no issues          Balance Exercises - 05/17/15 1607    Balance Exercises: Standing   Rockerboard Anterior/posterior;Lateral;Head turns;EO;EC;Intermittent UE support   Step Ups Forward;4 inch;Intermittent UE support  x 10 reps each leg   Balance Exercises: Standing   Rebounder Limitations with 4 inch step: alternating forward step ups with contralateral side performing high knee march at same time. Light UE support on bars for balance with min guard assist. Both ways on balance board: static hold no head movements EO, rocking with emphasis on tall/midline posture EO, static hold no head movements with EC, static hold with head nods/shakes EO. min guard to min assist for  balance with cues on posture and weight shifting                                                                        PT Short Term Goals - 04/27/15 1400    PT SHORT TERM GOAL #1   Title Patient demonstrates understanding of updated HEP (Target Date: 05/26/2015)   Time 4   Period Weeks   Status New   PT SHORT TERM GOAL #2   Title Patient ambulates with prostheses only with head turns to scan environment maintaining path.  (Target Date: 05/26/2015)   Time 4   Period Weeks   Status New   PT SHORT TERM GOAL #3   Title Patient able to reach 10" anteriorly and pick up object from floor safely without UE support.  (Target Date: 05/26/2015)   Time 4   Period Weeks   Status New   PT SHORT TERM GOAL #4   Title Patient negotiates stairs with single rail safely with supervision.  (Target Date: 05/26/2015)   Time 4   Period Weeks   Status New           PT Long Term Goals - 04/27/15 1400    PT LONG TERM GOAL #1   Title Patient verbalizes & demonstrates understanding of ongoing fitness plan / HEP.  (Target Date: 07/21/2015)   Time 12   Period Weeks   Status New   PT LONG TERM GOAL #2   Title Berg Balance >45/56 to reduce risk of falls with standing activities. (Target Date: 07/21/2015)   Time 12   Period Weeks   Status New   PT LONG TERM GOAL #3  Title Functional Gait Assessment >20/30 to reduce fall risk with gait activities. (Target Date: 07/21/2015)   Time 12   Period Weeks   Status New   PT LONG TERM GOAL #4   Title Patient negotiates ramps & curbs without device and stairs with single rail only with prostheses modified independent. (Target Date: 07/21/2015)   Time 12   Period Weeks   Status New           Plan - 05/17/15 1521    Clinical Impression Statement Pt progressing well towards goals. Continues to be challenged with higher level balance activities. Demo'd improved speed with gait today and decreased need of UE support on rails at stairs.   Pt will benefit from skilled  therapeutic intervention in order to improve on the following deficits Abnormal gait;Decreased balance;Decreased endurance;Decreased mobility;Decreased strength;Prosthetic Dependency   Rehab Potential Good   PT Frequency Other (comment)  After eval, 2x/wk for 3 weeks, then 1x/wk for 9 weeks   PT Duration 12 weeks   PT Treatment/Interventions ADLs/Self Care Home Management;Gait training;Stair training;Functional mobility training;Therapeutic activities;Therapeutic exercise;Balance training;Neuromuscular re-education;Patient/family education;Prosthetic Training   PT Next Visit Plan prosthetic gait including barriers, strengthening and balance activities. assess STG's next week (after New Year holiday)   Consulted and Agree with Plan of Care Patient;Family member/caregiver   Family Member Consulted wife, [name]        Problem List Patient Active Problem List   Diagnosis Date Noted  . Abnormal cardiovascular stress test 12/29/2013  . Awaiting organ transplant 12/09/2013  . Leg ulcer ([place]) 11/17/2013  . Chronic kidney disease (CKD), stage V ([place]) 11/17/2013  . Diabetes ([place]) 11/17/2013  . Cancer of duodenum ([place]) 11/17/2013  . Dyslipidemia 11/17/2013  . Chronic kidney disease requiring chronic dialysis ([place]) 11/17/2013  . Hypercholesteremia 11/17/2013  . H/O malignant neoplasm 11/17/2013  . H/O: HTN (hypertension) 11/17/2013  . BP (high blood pressure) 11/17/2013  . Hypotension 11/17/2013  . Angiopathy, peripheral ([place]) 11/17/2013  . AF (paroxysmal atrial fibrillation) ([place]) 11/17/2013  . BKA stump complication ([place]) 00/86/7619  . S/P bilateral BKA (below knee amputation) ([place]) 08/03/2013  . S/P BKA (below knee amputation) bilateral ([place]) 07/30/2013  . Open wound of both legs with complication 50/93/2671  . Acute blood loss anemia 07/12/2013  . Syncope 07/09/2013  . Intolerance to CAPD peritoneal dialysis s/p CAPD cath removal 07/09/2013 07/05/2013  . Critical lower limb ischemia  06/30/2013  . Extremity pain 06/30/2013  . Atherosclerosis of native arteries of the extremities with ulceration(440.23) 06/17/2013  . Gait disorder 05/31/2013  . Weakness 05/24/2013  . Depression 04/03/2013  . GERD (gastroesophageal reflux disease) 04/03/2013  . Atrial fibrillation ([place]) 04/02/2013  . DM (diabetes mellitus) ([place]) 12/28/2012  . Other complications due to renal dialysis device, implant, and graft 05/19/2012  . Diabetes mellitus, type 2 ([place]) 11/13/2011  . Anemia 07/02/2011  . ESRD on hemodialysis [facility]) 05/29/2011    Willow Ora 05/17/2015, 4:12 PM  Willow Ora, PTA, [facility] [address] [place], [place] [address] [phone] 05/17/2015, 4:13 PM   Name: Paul [name] MRN: [id number] Date of Birth: 01-27-1956

## 2015-05-18 ENCOUNTER — Ambulatory Visit: Payer: Managed Care, Other (non HMO) | Admitting: Physical Therapy

## 2015-05-18 ENCOUNTER — Encounter: Payer: Self-pay | Admitting: Physical Therapy

## 2015-05-18 DIAGNOSIS — R2689 Other abnormalities of gait and mobility: Secondary | ICD-10-CM

## 2015-05-18 DIAGNOSIS — R269 Unspecified abnormalities of gait and mobility: Secondary | ICD-10-CM | POA: Diagnosis not present

## 2015-05-18 DIAGNOSIS — Z7409 Other reduced mobility: Secondary | ICD-10-CM

## 2015-05-18 DIAGNOSIS — R531 Weakness: Secondary | ICD-10-CM

## 2015-05-18 DIAGNOSIS — R2681 Unsteadiness on feet: Secondary | ICD-10-CM

## 2015-05-19 NOTE — Therapy (Signed)
[facility] [address] [place], [place], [address] Phone: [phone]   Fax:  [phone]  Physical Therapy Treatment  Patient Details  Name: Paul [name] MRN: [id number] Date of Birth: February 20, 1956 Referring Provider: [name], MD  Encounter Date: 05/18/2015      PT End of Session - 05/18/15 1705    Visit Number 7   Number of Visits 15   Date for PT Re-Evaluation 07/21/15   Authorization Type [employer]ign[employer]   PT Start Time 1615   PT Stop Time 1700   PT Time Calculation (min) 45 min   Activity Tolerance Patient tolerated treatment well   Behavior During Therapy [facility] for tasks assessed/performed      Past Medical History  Diagnosis Date  . Open wound of both legs with complication     Pt has had progressive wounds of both LE's including gangrene of the toes and patchy necrosis of the calves.  He has been treated at [facility] by Dr Jerline Pain with hyperbaric O2 5d per week.  He is getting Na Thiosulfate with HD for suspected calciphylaxis. Pt says doctor's aren't sure if these ulcers were diabetic ulcers or calciphylaxis.  He underwent bilat BKA on 07/30/13.  He says that the woun  . ESRD on hemodialysis ([place])     Pt has ESRD due to DM.  He had a L upper arm AVF prior to starting HD which was ligated due to L arm steal syndrome.  He started HD in Jan 2014 and did home HD.  The family couldn't cannulate the R arm AVF successfully so by mid 2014 they decided to switch to PD which was done late summer 2014.  In Jan 2015 he was admitted with FTT felt to be due to underdialysis on PD and PD was abandoned and he   . Anemia     esrd  . Hypertension     off of meds due to orthostatic hypotension  . Closed left arm fracture 1967  . Heart murmur   . Corneal abrasion, left   . GERD (gastroesophageal reflux disease)   . History of blood transfusion   . Glomerulosclerosis, diabetic ([place])   . Depression   . A-fib ([place])   .  Calciphylaxis 05/2013    lower ext  . Diabetes mellitus     Type 1 iddm x 11 yrs  . Diabetic neuropathy ([place])   . Cancer ([place])     hx duodenal adenoCa 2002, resected    Past Surgical History  Procedure Laterality Date  . Whipple procedure  2002    duodenal ca, Dr. Harlow Asa  . Bone marrow biopsy  2012  . Renal biopsy, percutaneous  2012  . Tonsillectomy    . Portacath placement  2002  . Biliary diversion, external  2002  . Other surgical history      Cyst removed from back  . Av fistula placement  06/07/2011    Procedure: ARTERIOVENOUS (AV) FISTULA CREATION;  Surgeon: Angelia Mould, MD;  Location: [facility];  Service: Vascular;  Laterality: Left;  . Ligation of arteriovenous  fistula  05/22/2012    Procedure: LIGATION OF ARTERIOVENOUS  FISTULA;  Surgeon: Mal Misty, MD;  Location: [facility];  Service: Vascular;  Laterality: Left;  Left brachio-cephalic fistula  . Insertion of dialysis catheter  05/22/2012    Procedure: INSERTION OF DIALYSIS CATHETER;  Surgeon: Mal Misty, MD;  Location: [facility];  Service: Vascular;  Laterality: N/A;  right  internal jugular vein  . Av fistula placement  06/18/2012    Procedure: ARTERIOVENOUS (AV) FISTULA CREATION;  Surgeon: Mal Misty, MD;  Location: [facility];  Service: Vascular;  Laterality: Right;  [place]  . Colonoscopy      Hx: of  . Capd insertion N/A 01/14/2013    Procedure: LAPAROSCOPIC INSERTION CONTINUOUS AMBULATORY PERITONEAL DIALYSIS  (CAPD) CATHETER;  Surgeon: Adin Hector, MD;  Location: [facility];  Service: General;  Laterality: N/A;  . Vascular surgery    . Removal of a dialysis catheter    . Capd removal N/A 07/09/2013    Procedure: CONTINUOUS AMBULATORY PERITONEAL DIALYSIS  (CAPD) CATHETER REMOVAL;  Surgeon: Adin Hector, MD;  Location: [facility];  Service: General;  Laterality: N/A;  . Amputation Bilateral 07/30/2013    Procedure: AMPUTATION BELOW KNEE;  Surgeon: Newt Minion, MD;  Location: [facility];  Service: Orthopedics;  Laterality:  Bilateral;  Bilateral Below Knee Amputations  . Amputation Bilateral 09/03/2013    Procedure: AMPUTATION BELOW KNEE;  Surgeon: Newt Minion, MD;  Location: [facility];  Service: Orthopedics;  Laterality: Bilateral;  Bilateral Below Knee Amputation Revision  . Lower extremity angiogram N/A 06/14/2013    Procedure: LOWER EXTREMITY ANGIOGRAM;  Surgeon: Angelia Mould, MD;  Location: [facility] CATH LAB;  Service: Cardiovascular;  Laterality: N/A;  . Nephrectomy transplanted organ      There were no vitals filed for this visit.  Visit Diagnosis:  Abnormality of gait  Impaired functional mobility and activity tolerance  Weakness generalized  Balance problems  Unsteadiness      Subjective Assessment - 05/18/15 1619    Subjective No falls. Gets to bands 2-3 times per week. He has been busy with Christmas shopping.    Currently in Pain? No/denies     Neuromuscular Re-education: PT instructed with verbal & tactile cues balance reactions & stabilization. Picking up objects from floor with chair behind him & 2nd chair or support surface in front for safety with cues on wt distribution.  Reaching >10" to touch wall alternating UEs with tactile cues on balance reactions. Turning to look behind himself with demo & verbal cues on technique & wt shift. Step-ups & step-down on 4" aerobic step with touch on chairbacks or minA to stabilize with balance losses.  Lunges between 2 chairbacks for UE support 5 reps each LE position forward.  Prosthetic Training with bilateral Transtibial Amputations prostheses: Patient ambulated 500' without device with verbal cues on technique working on maintaining path & pace with scanning. Worked on changing directions 32* initially at counter then progressed in hallway with tactile, verbal & visual cues on technique. Stairs initially with 2 rails and progressed to 1 rail & 2nd UE sliding along wall with verbal cues on technique.  Backwards gait without UE support with  minA / tactile cues for balance reactions. Patient ambulated 400' outside including grass & gravel without device and negotiating curb with supervision.                              PT Short Term Goals - 04/27/15 1400    PT SHORT TERM GOAL #1   Title Patient demonstrates understanding of updated HEP (Target Date: 05/26/2015)   Time 4   Period Weeks   Status New   PT SHORT TERM GOAL #2   Title Patient ambulates with prostheses only with head turns to scan environment maintaining path.  (Target Date: 05/26/2015)  Time 4   Period Weeks   Status New   PT SHORT TERM GOAL #3   Title Patient able to reach 10" anteriorly and pick up object from floor safely without UE support.  (Target Date: 05/26/2015)   Time 4   Period Weeks   Status New   PT SHORT TERM GOAL #4   Title Patient negotiates stairs with single rail safely with supervision.  (Target Date: 05/26/2015)   Time 4   Period Weeks   Status New           PT Long Term Goals - 04/27/15 1400    PT LONG TERM GOAL #1   Title Patient verbalizes & demonstrates understanding of ongoing fitness plan / HEP.  (Target Date: 07/21/2015)   Time 12   Period Weeks   Status New   PT LONG TERM GOAL #2   Title Berg Balance >45/56 to reduce risk of falls with standing activities. (Target Date: 07/21/2015)   Time 12   Period Weeks   Status New   PT LONG TERM GOAL #3   Title Functional Gait Assessment >20/30 to reduce fall risk with gait activities. (Target Date: 07/21/2015)   Time 12   Period Weeks   Status New   PT LONG TERM GOAL #4   Title Patient negotiates ramps & curbs without device and stairs with single rail only with prostheses modified independent. (Target Date: 07/21/2015)   Time 12   Period Weeks   Status New               Plan - 05/18/15 1705    Clinical Impression Statement Patient's LE functional strength appears to be improving with ability to lower body weight under better control when activity  necessitates it. He had less balance losses with changing directions. Patient reports increasing activity level outside of PT.    Pt will benefit from skilled therapeutic intervention in order to improve on the following deficits Abnormal gait;Decreased balance;Decreased endurance;Decreased mobility;Decreased strength;Prosthetic Dependency   Rehab Potential Good   PT Frequency Other (comment)  After eval, 2x/wk for 3 weeks, then 1x/wk for 9 weeks   PT Duration 12 weeks   PT Treatment/Interventions ADLs/Self Care Home Management;Gait training;Stair training;Functional mobility training;Therapeutic activities;Therapeutic exercise;Balance training;Neuromuscular re-education;Patient/family education;Prosthetic Training   PT Next Visit Plan prosthetic gait including barriers, strengthening and balance activities. assess STG's next week (after New Year holiday)   Consulted and Agree with Plan of Care Patient        Problem List Patient Active Problem List   Diagnosis Date Noted  . Abnormal cardiovascular stress test 12/29/2013  . Awaiting organ transplant 12/09/2013  . Leg ulcer ([place]) 11/17/2013  . Chronic kidney disease (CKD), stage V ([place]) 11/17/2013  . Diabetes ([place]) 11/17/2013  . Cancer of duodenum ([place]) 11/17/2013  . Dyslipidemia 11/17/2013  . Chronic kidney disease requiring chronic dialysis ([place]) 11/17/2013  . Hypercholesteremia 11/17/2013  . H/O malignant neoplasm 11/17/2013  . H/O: HTN (hypertension) 11/17/2013  . BP (high blood pressure) 11/17/2013  . Hypotension 11/17/2013  . Angiopathy, peripheral ([place]) 11/17/2013  . AF (paroxysmal atrial fibrillation) ([place]) 11/17/2013  . BKA stump complication ([place]) 03/50/0938  . S/P bilateral BKA (below knee amputation) ([place]) 08/03/2013  . S/P BKA (below knee amputation) bilateral ([place]) 07/30/2013  . Open wound of both legs with complication 18/29/9371  . Acute blood loss anemia 07/12/2013  . Syncope 07/09/2013  . Intolerance to CAPD  peritoneal dialysis s/p CAPD cath removal 07/09/2013 07/05/2013  .  Critical lower limb ischemia 06/30/2013  . Extremity pain 06/30/2013  . Atherosclerosis of native arteries of the extremities with ulceration(440.23) 06/17/2013  . Gait disorder 05/31/2013  . Weakness 05/24/2013  . Depression 04/03/2013  . GERD (gastroesophageal reflux disease) 04/03/2013  . Atrial fibrillation ([place]) 04/02/2013  . DM (diabetes mellitus) ([place]) 12/28/2012  . Other complications due to renal dialysis device, implant, and graft 05/19/2012  . Diabetes mellitus, type 2 ([place]) 11/13/2011  . Anemia 07/02/2011  . ESRD on hemodialysis ([place]) 05/29/2011    Paul [name] PT, DPT 05/19/2015, 11:04 AM  [facility] [address] [place], [place], [address] Phone: [phone]   Fax:  [phone]  Name: Paul [name] MRN: [id number] Date of Birth: 1956/01/07

## 2015-05-23 ENCOUNTER — Encounter: Payer: Self-pay | Admitting: Physical Therapy

## 2015-05-23 ENCOUNTER — Ambulatory Visit: Payer: Managed Care, Other (non HMO) | Attending: Family Medicine | Admitting: Physical Therapy

## 2015-05-23 DIAGNOSIS — R2991 Unspecified symptoms and signs involving the musculoskeletal system: Secondary | ICD-10-CM | POA: Insufficient documentation

## 2015-05-23 DIAGNOSIS — Z7409 Other reduced mobility: Secondary | ICD-10-CM

## 2015-05-23 DIAGNOSIS — Z89511 Acquired absence of right leg below knee: Secondary | ICD-10-CM | POA: Diagnosis present

## 2015-05-23 DIAGNOSIS — R531 Weakness: Secondary | ICD-10-CM

## 2015-05-23 DIAGNOSIS — Z5189 Encounter for other specified aftercare: Secondary | ICD-10-CM | POA: Insufficient documentation

## 2015-05-23 DIAGNOSIS — Z940 Kidney transplant status: Secondary | ICD-10-CM

## 2015-05-23 DIAGNOSIS — Z89512 Acquired absence of left leg below knee: Secondary | ICD-10-CM | POA: Diagnosis present

## 2015-05-23 DIAGNOSIS — R2689 Other abnormalities of gait and mobility: Secondary | ICD-10-CM

## 2015-05-23 DIAGNOSIS — R29818 Other symptoms and signs involving the nervous system: Secondary | ICD-10-CM | POA: Diagnosis present

## 2015-05-23 DIAGNOSIS — R2681 Unsteadiness on feet: Secondary | ICD-10-CM | POA: Diagnosis present

## 2015-05-23 DIAGNOSIS — Z4789 Encounter for other orthopedic aftercare: Secondary | ICD-10-CM

## 2015-05-23 DIAGNOSIS — R269 Unspecified abnormalities of gait and mobility: Secondary | ICD-10-CM | POA: Diagnosis present

## 2015-05-24 NOTE — Therapy (Signed)
[facility] [address] [place], [place], [address] Phone: [phone]   Fax:  [phone]  Physical Therapy Treatment  Patient Details  Name: Paul [name] MRN: [id number] Date of Birth: 28-Nov-1955 Referring Provider: [name], MD  Encounter Date: 05/23/2015      PT End of Session - 05/23/15 1605    Visit Number 8   Number of Visits 15   Date for PT Re-Evaluation 07/21/15   Authorization Type [employer]   PT Start Time 1605   PT Stop Time 1700   PT Time Calculation (min) 55 min   Activity Tolerance Patient tolerated treatment well   Behavior During Therapy [facility] for tasks assessed/performed      Past Medical History  Diagnosis Date  . Open wound of both legs with complication     Pt has had progressive wounds of both LE's including gangrene of the toes and patchy necrosis of the calves.  He has been treated at [facility] by Dr Jerline Pain with hyperbaric O2 5d per week.  He is getting Na Thiosulfate with HD for suspected calciphylaxis. Pt says doctor's aren't sure if these ulcers were diabetic ulcers or calciphylaxis.  He underwent bilat BKA on 07/30/13.  He says that the woun  . ESRD on hemodialysis ([place])     Pt has ESRD due to DM.  He had a L upper arm AVF prior to starting HD which was ligated due to L arm steal syndrome.  He started HD in Jan 2014 and did home HD.  The family couldn't cannulate the R arm AVF successfully so by mid 2014 they decided to switch to PD which was done late summer 2014.  In Jan 2015 he was admitted with FTT felt to be due to underdialysis on PD and PD was abandoned and he   . Anemia     esrd  . Hypertension     off of meds due to orthostatic hypotension  . Closed left arm fracture 1967  . Heart murmur   . Corneal abrasion, left   . GERD (gastroesophageal reflux disease)   . History of blood transfusion   . Glomerulosclerosis, diabetic ([place])   . Depression   . A-fib ([place])   .  Calciphylaxis 05/2013    lower ext  . Diabetes mellitus     Type 1 iddm x 11 yrs  . Diabetic neuropathy ([place])   . Cancer ([place])     hx duodenal adenoCa 2002, resected    Past Surgical History  Procedure Laterality Date  . Whipple procedure  2002    duodenal ca, Dr. Harlow Asa  . Bone marrow biopsy  2012  . Renal biopsy, percutaneous  2012  . Tonsillectomy    . Portacath placement  2002  . Biliary diversion, external  2002  . Other surgical history      Cyst removed from back  . Av fistula placement  06/07/2011    Procedure: ARTERIOVENOUS (AV) FISTULA CREATION;  Surgeon: Angelia Mould, MD;  Location: [facility];  Service: Vascular;  Laterality: Left;  . Ligation of arteriovenous  fistula  05/22/2012    Procedure: LIGATION OF ARTERIOVENOUS  FISTULA;  Surgeon: Mal Misty, MD;  Location: [facility];  Service: Vascular;  Laterality: Left;  Left brachio-cephalic fistula  . Insertion of dialysis catheter  05/22/2012    Procedure: INSERTION OF DIALYSIS CATHETER;  Surgeon: Mal Misty, MD;  Location: [facility];  Service: Vascular;  Laterality: N/A;  right  internal jugular vein  . Av fistula placement  06/18/2012    Procedure: ARTERIOVENOUS (AV) FISTULA CREATION;  Surgeon: Mal Misty, MD;  Location: [facility];  Service: Vascular;  Laterality: Right;  [place]  . Colonoscopy      Hx: of  . Capd insertion N/A 01/14/2013    Procedure: LAPAROSCOPIC INSERTION CONTINUOUS AMBULATORY PERITONEAL DIALYSIS  (CAPD) CATHETER;  Surgeon: Adin Hector, MD;  Location: [facility];  Service: General;  Laterality: N/A;  . Vascular surgery    . Removal of a dialysis catheter    . Capd removal N/A 07/09/2013    Procedure: CONTINUOUS AMBULATORY PERITONEAL DIALYSIS  (CAPD) CATHETER REMOVAL;  Surgeon: Adin Hector, MD;  Location: [facility];  Service: General;  Laterality: N/A;  . Amputation Bilateral 07/30/2013    Procedure: AMPUTATION BELOW KNEE;  Surgeon: Newt Minion, MD;  Location: [facility];  Service: Orthopedics;  Laterality:  Bilateral;  Bilateral Below Knee Amputations  . Amputation Bilateral 09/03/2013    Procedure: AMPUTATION BELOW KNEE;  Surgeon: Newt Minion, MD;  Location: [facility];  Service: Orthopedics;  Laterality: Bilateral;  Bilateral Below Knee Amputation Revision  . Lower extremity angiogram N/A 06/14/2013    Procedure: LOWER EXTREMITY ANGIOGRAM;  Surgeon: Angelia Mould, MD;  Location: [facility] CATH LAB;  Service: Cardiovascular;  Laterality: N/A;  . Nephrectomy transplanted organ      There were no vitals filed for this visit.  Visit Diagnosis:  Abnormality of gait  Impaired functional mobility and activity tolerance  Weakness generalized  Balance problems  Unsteadiness  Encounter for prosthetic gait training  S/P bilateral BKA (below knee amputation) ([place])  Kidney transplant recipient      Subjective Assessment - 05/23/15 1617    Subjective (p) Still working to increase exercise time.    Currently in Pain? (p) No/denies     Prosthetic Training with bilateral Transtibial Amputation Prostheses Patient verbalized understanding of current HEP and utilizing "down time" to build into his work schedule better. Patient reaches 10" and picks up from floor with no UE support with supervision.  Stairs progressing from 2 rails to 1 rail with second hand sliding along wall to 1 rail without support on second UE with supervision.  Ramps with prostheses only with cues on wt shift & posture. Curbs (4" aerobic step with supervision and 6" aerobic step with occasional minA) with cues on foot placement and wt shift.  Patient ambulates with head turns to scan environment maintaining path with slight decrease in speed with cues. Patient worked on increasing speed to simulate crossing a street with quicker & longer steps.  Static stance in step position with width 4" up to 10 seconds.  Lunges between 2 chairbacks with cues to build LE strength.  Gait with narrowing step width using visual cues of floor  to keep feet within 12" block with supervision.                               PT Short Term Goals - 05/23/15 1700    PT SHORT TERM GOAL #1   Title Patient demonstrates understanding of updated HEP (Target Date: 05/26/2015)   Baseline MET 05/23/2015   Time 4   Period Weeks   Status Achieved   PT SHORT TERM GOAL #2   Title Patient ambulates with prostheses only with head turns to scan environment maintaining path.  (Target Date: 05/26/2015)   Baseline MET 05/23/2015  Time 4   Period Weeks   Status Achieved   PT SHORT TERM GOAL #3   Title Patient able to reach 10" anteriorly and pick up object from floor safely without UE support.  (Target Date: 05/26/2015)   Baseline MET 05/23/2015   Time 4   Period Weeks   Status Achieved   PT SHORT TERM GOAL #4   Title Patient negotiates stairs with single rail safely with supervision.  (Target Date: 05/26/2015)   Baseline MET 05/23/2015   Time 4   Period Weeks   Status Achieved   PT SHORT TERM GOAL #5   Title Berg Balance >/= 42/56 (Target Date 06/23/2015)   Time 4   Period Weeks   Status New   Additional Short Term Goals   Additional Short Term Goals Yes   PT SHORT TERM GOAL #6   Title Patient ambulates 500' outside surfaces including negotiating ramps & curbs with prostheses only with supervision. (Target Date 06/23/2015)   Time 4   Period Weeks   Status New   PT SHORT TERM GOAL #7   Title Patient able to increase gait velocity at fast pace to >4.5 ft/sec to enable to safely cross a street. (Target Date 06/23/2015)           PT Long Term Goals - 04/27/15 1400    PT LONG TERM GOAL #1   Title Patient verbalizes & demonstrates understanding of ongoing fitness plan / HEP.  (Target Date: 07/21/2015)   Time 12   Period Weeks   Status New   PT LONG TERM GOAL #2   Title Berg Balance >45/56 to reduce risk of falls with standing activities. (Target Date: 07/21/2015)   Time 12   Period Weeks   Status New   PT LONG TERM GOAL #3    Title Functional Gait Assessment >20/30 to reduce fall risk with gait activities. (Target Date: 07/21/2015)   Time 12   Period Weeks   Status New   PT LONG TERM GOAL #4   Title Patient negotiates ramps & curbs without device and stairs with single rail only with prostheses modified independent. (Target Date: 07/21/2015)   Time 12   Period Weeks   Status New               Plan - 05/23/15 1700    Clinical Impression Statement Patient met all STGs for first month of certification plan. Patient improved ability to negotiate barriers but strength still negatively impacts negotiating. Patient's balance has improved for reaching & picking up objects from floor.    Pt will benefit from skilled therapeutic intervention in order to improve on the following deficits Abnormal gait;Decreased balance;Decreased endurance;Decreased mobility;Decreased strength;Prosthetic Dependency   Rehab Potential Good   PT Frequency Other (comment)  After eval, 2x/wk for 3 weeks, then 1x/wk for 9 weeks   PT Duration 12 weeks   PT Treatment/Interventions ADLs/Self Care Home Management;Gait training;Stair training;Functional mobility training;Therapeutic activities;Therapeutic exercise;Balance training;Neuromuscular re-education;Patient/family education;Prosthetic Training   PT Next Visit Plan prosthetic gait including barriers, strengthening and balance activities. frequency now 1x/wk as plan of care   Consulted and Agree with Plan of Care Patient        Problem List Patient Active Problem List   Diagnosis Date Noted  . Abnormal cardiovascular stress test 12/29/2013  . Awaiting organ transplant 12/09/2013  . Leg ulcer ([place]) 11/17/2013  . Chronic kidney disease (CKD), stage V ([place]) 11/17/2013  . Diabetes ([place]) 11/17/2013  . Cancer of duodenum ([place])  11/17/2013  . Dyslipidemia 11/17/2013  . Chronic kidney disease requiring chronic dialysis ([place]) 11/17/2013  . Hypercholesteremia 11/17/2013  . H/O malignant  neoplasm 11/17/2013  . H/O: HTN (hypertension) 11/17/2013  . BP (high blood pressure) 11/17/2013  . Hypotension 11/17/2013  . Angiopathy, peripheral ([place]) 11/17/2013  . AF (paroxysmal atrial fibrillation) ([place]) 11/17/2013  . BKA stump complication ([place]) 50/72/2575  . S/P bilateral BKA (below knee amputation) ([place]) 08/03/2013  . S/P BKA (below knee amputation) bilateral ([place]) 07/30/2013  . Open wound of both legs with complication 10/04/3356  . Acute blood loss anemia 07/12/2013  . Syncope 07/09/2013  . Intolerance to CAPD peritoneal dialysis s/p CAPD cath removal 07/09/2013 07/05/2013  . Critical lower limb ischemia 06/30/2013  . Extremity pain 06/30/2013  . Atherosclerosis of native arteries of the extremities with ulceration(440.23) 06/17/2013  . Gait disorder 05/31/2013  . Weakness 05/24/2013  . Depression 04/03/2013  . GERD (gastroesophageal reflux disease) 04/03/2013  . Atrial fibrillation ([place]) 04/02/2013  . DM (diabetes mellitus) ([place]) 12/28/2012  . Other complications due to renal dialysis device, implant, and graft 05/19/2012  . Diabetes mellitus, type 2 ([place]) 11/13/2011  . Anemia 07/02/2011  . ESRD on hemodialysis ([place]) 05/29/2011    Valyn Latchford PT, DPT 05/24/2015, 9:45 AM  [facility] [address] [place], [place], [address] Phone: [phone]   Fax:  [phone]  Name: Paul [name] MRN: [id number] Date of Birth: 04-Nov-1955

## 2015-05-30 ENCOUNTER — Encounter: Payer: Self-pay | Admitting: Physical Therapy

## 2015-05-30 ENCOUNTER — Ambulatory Visit: Payer: Managed Care, Other (non HMO) | Admitting: Physical Therapy

## 2015-05-30 DIAGNOSIS — R2681 Unsteadiness on feet: Secondary | ICD-10-CM

## 2015-05-30 DIAGNOSIS — R269 Unspecified abnormalities of gait and mobility: Secondary | ICD-10-CM

## 2015-05-30 DIAGNOSIS — R531 Weakness: Secondary | ICD-10-CM

## 2015-05-30 DIAGNOSIS — Z7409 Other reduced mobility: Secondary | ICD-10-CM

## 2015-05-30 DIAGNOSIS — R2689 Other abnormalities of gait and mobility: Secondary | ICD-10-CM

## 2015-05-30 DIAGNOSIS — Z89512 Acquired absence of left leg below knee: Secondary | ICD-10-CM

## 2015-05-30 DIAGNOSIS — Z89511 Acquired absence of right leg below knee: Secondary | ICD-10-CM

## 2015-05-30 DIAGNOSIS — Z940 Kidney transplant status: Secondary | ICD-10-CM

## 2015-05-30 DIAGNOSIS — Z4789 Encounter for other orthopedic aftercare: Secondary | ICD-10-CM

## 2015-05-31 NOTE — Therapy (Signed)
[facility] [address] [place], [place], [address] Phone: [phone]   Fax:  [phone]  Physical Therapy Treatment  Patient Details  Name: Paul [name] MRN: [id number] Date of Birth: 10-Nov-1955 Referring Provider: [name], MD  Encounter Date: 05/30/2015      PT End of Session - 05/30/15 1510    Visit Number 9   Number of Visits 15   Date for PT Re-Evaluation 07/21/15   Authorization Type Cign[employer]   PT Start Time 1615   PT Stop Time 1700   PT Time Calculation (min) 45 min   Equipment Utilized During Treatment Gait belt   Activity Tolerance Patient tolerated treatment well   Behavior During Therapy [facility] for tasks assessed/performed      Past Medical History  Diagnosis Date  . Open wound of both legs with complication     Pt has had progressive wounds of both LE's including gangrene of the toes and patchy necrosis of the calves.  He has been treated at [facility] by Dr Jerline Pain with hyperbaric O2 5d per week.  He is getting Na Thiosulfate with HD for suspected calciphylaxis. Pt says doctor's aren't sure if these ulcers were diabetic ulcers or calciphylaxis.  He underwent bilat BKA on 07/30/13.  He says that the woun  . ESRD on hemodialysis ([place])     Pt has ESRD due to DM.  He had a L upper arm AVF prior to starting HD which was ligated due to L arm steal syndrome.  He started HD in Jan 2014 and did home HD.  The family couldn't cannulate the R arm AVF successfully so by mid 2014 they decided to switch to PD which was done late summer 2014.  In Jan 2015 he was admitted with FTT felt to be due to underdialysis on PD and PD was abandoned and he   . Anemia     esrd  . Hypertension     off of meds due to orthostatic hypotension  . Closed left arm fracture 1967  . Heart murmur   . Corneal abrasion, left   . GERD (gastroesophageal reflux disease)   . History of blood transfusion   . Glomerulosclerosis, diabetic  ([facility])   . Depression   . A-fib ([place])   . Calciphylaxis 05/2013    lower ext  . Diabetes mellitus     Type 1 iddm x 11 yrs  . Diabetic neuropathy ([place])   . Cancer ([place])     hx duodenal adenoCa 2002, resected    Past Surgical History  Procedure Laterality Date  . Whipple procedure  2002    duodenal ca, Dr. Harlow Asa  . Bone marrow biopsy  2012  . Renal biopsy, percutaneous  2012  . Tonsillectomy    . Portacath placement  2002  . Biliary diversion, external  2002  . Other surgical history      Cyst removed from back  . Av fistula placement  06/07/2011    Procedure: ARTERIOVENOUS (AV) FISTULA CREATION;  Surgeon: Angelia Mould, MD;  Location: [facility];  Service: Vascular;  Laterality: Left;  . Ligation of arteriovenous  fistula  05/22/2012    Procedure: LIGATION OF ARTERIOVENOUS  FISTULA;  Surgeon: Mal Misty, MD;  Location: [facility];  Service: Vascular;  Laterality: Left;  Left brachio-cephalic fistula  . Insertion of dialysis catheter  05/22/2012    Procedure: INSERTION OF DIALYSIS CATHETER;  Surgeon: Mal Misty, MD;  Location: [facility];  Service: Vascular;  Laterality: N/A;  right internal jugular vein  . Av fistula placement  06/18/2012    Procedure: ARTERIOVENOUS (AV) FISTULA CREATION;  Surgeon: Mal Misty, MD;  Location: [facility];  Service: Vascular;  Laterality: Right;  [place]  . Colonoscopy      Hx: of  . Capd insertion N/A 01/14/2013    Procedure: LAPAROSCOPIC INSERTION CONTINUOUS AMBULATORY PERITONEAL DIALYSIS  (CAPD) CATHETER;  Surgeon: Adin Hector, MD;  Location: [facility];  Service: General;  Laterality: N/A;  . Vascular surgery    . Removal of a dialysis catheter    . Capd removal N/A 07/09/2013    Procedure: CONTINUOUS AMBULATORY PERITONEAL DIALYSIS  (CAPD) CATHETER REMOVAL;  Surgeon: Adin Hector, MD;  Location: [facility];  Service: General;  Laterality: N/A;  . Amputation Bilateral 07/30/2013    Procedure: AMPUTATION BELOW KNEE;  Surgeon: Newt Minion, MD;  Location: [place] [name];  Service: Orthopedics;  Laterality: Bilateral;  Bilateral Below Knee Amputations  . Amputation Bilateral 09/03/2013    Procedure: AMPUTATION BELOW KNEE;  Surgeon: Newt Minion, MD;  Location: [facility];  Service: Orthopedics;  Laterality: Bilateral;  Bilateral Below Knee Amputation Revision  . Lower extremity angiogram N/A 06/14/2013    Procedure: LOWER EXTREMITY ANGIOGRAM;  Surgeon: Angelia Mould, MD;  Location: [facility] C[facility] LAB;  Service: Cardiovascular;  Laterality: N/A;  . Nephrectomy transplanted organ      There were no vitals filed for this visit.  Visit Diagnosis:  Abnormality of gait  Impaired functional mobility and activity tolerance  Weakness generalized  Balance problems  Unsteadiness  Encounter for prosthetic gait training  S/P bilateral BKA (below knee amputation) ([place])  Kidney transplant recipient      Subjective Assessment - 05/30/15 1619    Subjective (p) No falls. He has been out in the snow without issues.    Currently in Pain? (p) No/denies     Prosthetic Training with Bilateral Transtibial Prostheses:  Discussion with pt & wife on using his personality "morning person" to increase participation in exercise program consistently.  Floor transfers using mat table to push up with cues on technique 2 reps each leading with Rt & with Lt LEs 2 sets. PT demonstrated hip flexor / quad stretch in half kneeling position with & without wife's assistance. Both verbalized understanding. Curb / single step: initiated with 3" aerobic step and progressed to 6" with lift under aerobic step with 2 chairbacks for ability to touch for balance as needed & confidence. Pt had to occasionally use chairs. 3 reps leading with each LE to ascend & descend 1 set at 3" and 1 set at 6". Ramps: Patient negotiated ramp without device with cues on posture, step length, step width and wt shift but no assistance.  Gait: increasing speed in hall with walls for security and PT supervising with  cues on quicker & longer steps.                             PT Education - 05/30/15 1510    Education provided Yes   Education Details floor transfer for multi-purposes, half kneeling hip flexor / quad stretch with & without wife's assist, placing chairbacks near single step into den & with wife's supervision to work on negotiating curb/ step without device   Person(s) Educated Patient;Spouse   Methods Explanation;Demonstration;Tactile cues;Verbal cues   Comprehension Verbalized understanding;Returned demonstration;Verbal cues required;Tactile cues required  PT Short Term Goals - 05/23/15 1700    PT SHORT TERM GOAL #1   Title Patient demonstrates understanding of updated HEP (Target Date: 05/26/2015)   Baseline MET 05/23/2015   Time 4   Period Weeks   Status Achieved   PT SHORT TERM GOAL #2   Title Patient ambulates with prostheses only with head turns to scan environment maintaining path.  (Target Date: 05/26/2015)   Baseline MET 05/23/2015   Time 4   Period Weeks   Status Achieved   PT SHORT TERM GOAL #3   Title Patient able to reach 10" anteriorly and pick up object from floor safely without UE support.  (Target Date: 05/26/2015)   Baseline MET 05/23/2015   Time 4   Period Weeks   Status Achieved   PT SHORT TERM GOAL #4   Title Patient negotiates stairs with single rail safely with supervision.  (Target Date: 05/26/2015)   Baseline MET 05/23/2015   Time 4   Period Weeks   Status Achieved   PT SHORT TERM GOAL #5   Title Berg Balance >/= 42/56 (Target Date 06/23/2015)   Time 4   Period Weeks   Status New   Additional Short Term Goals   Additional Short Term Goals Yes   PT SHORT TERM GOAL #6   Title Patient ambulates 500' outside surfaces including negotiating ramps & curbs with prostheses only with supervision. (Target Date 06/23/2015)   Time 4   Period Weeks   Status New   PT SHORT TERM GOAL #7   Title Patient able to increase gait velocity at fast  pace to >4.5 ft/sec to enable to safely cross a street. (Target Date 06/23/2015)           PT Long Term Goals - 04/27/15 1400    PT LONG TERM GOAL #1   Title Patient verbalizes & demonstrates understanding of ongoing fitness plan / HEP.  (Target Date: 07/21/2015)   Time 12   Period Weeks   Status New   PT LONG TERM GOAL #2   Title Berg Balance >45/56 to reduce risk of falls with standing activities. (Target Date: 07/21/2015)   Time 12   Period Weeks   Status New   PT LONG TERM GOAL #3   Title Functional Gait Assessment >20/30 to reduce fall risk with gait activities. (Target Date: 07/21/2015)   Time 12   Period Weeks   Status New   PT LONG TERM GOAL #4   Title Patient negotiates ramps & curbs without device and stairs with single rail only with prostheses modified independent. (Target Date: 07/21/2015)   Time 12   Period Weeks   Status New               Plan - 05/30/15 1710    Clinical Impression Statement Patient improved LE functional strength with floor transfers and negotiating single step / curb without device except prostheses. Patient was able to ambulate with narrower step width including with increasing speed.   Pt will benefit from skilled therapeutic intervention in order to improve on the following deficits Abnormal gait;Decreased balance;Decreased endurance;Decreased mobility;Decreased strength;Prosthetic Dependency   Rehab Potential Good   PT Frequency Other (comment)  After eval, 2x/wk for 3 weeks, then 1x/wk for 9 weeks   PT Duration 12 weeks   PT Treatment/Interventions ADLs/Self Care Home Management;Gait training;Stair training;Functional mobility training;Therapeutic activities;Therapeutic exercise;Balance training;Neuromuscular re-education;Patient/family education;Prosthetic Training   PT Next Visit Plan prosthetic gait including barriers, strengthening and balance activities. frequency  now 1x/wk as plan of care   Consulted and Agree with Plan of Care Patient         Problem List Patient Active Problem List   Diagnosis Date Noted  . Abnormal cardiovascular stress test 12/29/2013  . Awaiting organ transplant 12/09/2013  . Leg ulcer ([place]) 11/17/2013  . Chronic kidney disease (CKD), stage V ([place]) 11/17/2013  . Diabetes ([place]) 11/17/2013  . Cancer of duodenum ([place]) 11/17/2013  . Dyslipidemia 11/17/2013  . Chronic kidney disease requiring chronic dialysis ([place]) 11/17/2013  . Hypercholesteremia 11/17/2013  . H/O malignant neoplasm 11/17/2013  . H/O: HTN (hypertension) 11/17/2013  . BP (high blood pressure) 11/17/2013  . Hypotension 11/17/2013  . Angiopathy, peripheral ([place]) 11/17/2013  . AF (paroxysmal atrial fibrillation) ([place]) 11/17/2013  . BKA stump complication ([place]) 85/63/1497  . S/P bilateral BKA (below knee amputation) ([place]) 08/03/2013  . S/P BKA (below knee amputation) bilateral ([place]) 07/30/2013  . Open wound of both legs with complication 02/63/7858  . Acute blood loss anemia 07/12/2013  . Syncope 07/09/2013  . Intolerance to CAPD peritoneal dialysis s/p CAPD cath removal 07/09/2013 07/05/2013  . Critical lower limb ischemia 06/30/2013  . Extremity pain 06/30/2013  . Atherosclerosis of native arteries of the extremities with ulceration(440.23) 06/17/2013  . Gait disorder 05/31/2013  . Weakness 05/24/2013  . Depression 04/03/2013  . GERD (gastroesophageal reflux disease) 04/03/2013  . Atrial fibrillation ([place]) 04/02/2013  . DM (diabetes mellitus) ([place]) 12/28/2012  . Other complications due to renal dialysis device, implant, and graft 05/19/2012  . Diabetes mellitus, type 2 ([place]) 11/13/2011  . Anemia 07/02/2011  . ESRD on hemodialysis ([place]) 05/29/2011    WALDRON,ROBIN PT, DPT 05/31/2015, 7:17 AM  [facility] [address] [place], [place], [address] Phone: [phone]   Fax:  [phone]  Name: Paul [name] MRN: [id number] Date of Birth: July 14, 1955

## 2015-06-05 ENCOUNTER — Encounter: Payer: Self-pay | Admitting: Cardiology

## 2015-06-05 ENCOUNTER — Ambulatory Visit (INDEPENDENT_AMBULATORY_CARE_PROVIDER_SITE_OTHER): Payer: Managed Care, Other (non HMO) | Admitting: Cardiology

## 2015-06-05 VITALS — BP 150/70 | HR 60 | Ht 72.0 in | Wt 187.4 lb

## 2015-06-05 DIAGNOSIS — Z89511 Acquired absence of right leg below knee: Secondary | ICD-10-CM

## 2015-06-05 DIAGNOSIS — I739 Peripheral vascular disease, unspecified: Secondary | ICD-10-CM

## 2015-06-05 DIAGNOSIS — I480 Paroxysmal atrial fibrillation: Secondary | ICD-10-CM

## 2015-06-05 DIAGNOSIS — E7800 Pure hypercholesterolemia, unspecified: Secondary | ICD-10-CM

## 2015-06-05 DIAGNOSIS — Z89512 Acquired absence of left leg below knee: Secondary | ICD-10-CM

## 2015-06-05 NOTE — Progress Notes (Signed)
[place], [place], [place]  [address] Phone: [phone] Fax:  [phone]  Date:  06/05/2015   ID:  Shahriar, Brettschneider 21-Sep-1955, MRN [id number]  PCP:  [name], MD     History of Present Illness: Paul [name] is a 60 y.o. male seen for follow up of AFib. He has  a hx of AFib, DM, ESRD on peritoneal dialysis, duodenal CA, GERD, depression.  He has no reported CAD.  He had AFib in late 2014.   His Coumadin was stopped in 06/2013 due to concerns for coumadin necrosis.  He ultimately underwent bilateral BKAs for LE gangrene and calciphylaxis.  He underwent cardiac evaluation at [facility] with a pharmacologic stress Echo in 2015. A wall motion abnormality was seen. Subsequent Myoview study showed normal perfusion. EF was normal by Echo but 44% by nuclear study.  In January 2016 he had successful renal transplant at [facility]. He is feeling much better and has returned to work. The patient denies palpitations, chest pain, shortness of breath, syncope, orthopnea, PND. His stumps are OK.  He has had no recurrent of Afib.    Studies:  - Echo (07/13/13):  EF 55-60%, normal motion, mild LAE  - Carotid US (07/11/13):  Bilateral ICA 1-39%.   Recent Labs: 08/02/2014: Creatinine, Ser 5.23*; Hemoglobin 8.9*; Potassium 5.3*  Wt Readings from Last 3 Encounters:  06/05/15 85.021 kg (187 lb 7 oz)  09/09/14 72.576 kg (160 lb)  08/02/14 77.111 kg (170 lb)     Past Medical History  Diagnosis Date  . Open wound of both legs with complication     Pt has had progressive wounds of both LE's including gangrene of the toes and patchy necrosis of the calves.  He has been treated at [facility] by Dr Jerline Pain with hyperbaric O2 5d per week.  He is getting Na Thiosulfate with HD for suspected calciphylaxis. Pt says doctor's aren't sure if these ulcers were diabetic ulcers or calciphylaxis.  He underwent bilat BKA on 07/30/13.  He says that the woun  . ESRD on hemodialysis ([place])     Pt has ESRD due to DM.   He had a L upper arm AVF prior to starting HD which was ligated due to L arm steal syndrome.  He started HD in Jan 2014 and did home HD.  The family couldn't cannulate the R arm AVF successfully so by mid 2014 they decided to switch to PD which was done late summer 2014.  In Jan 2015 he was admitted with FTT felt to be due to underdialysis on PD and PD was abandoned and he   . Anemia     esrd  . Hypertension     off of meds due to orthostatic hypotension  . Closed left arm fracture 1967  . Heart murmur   . Corneal abrasion, left   . GERD (gastroesophageal reflux disease)   . History of blood transfusion   . Glomerulosclerosis, diabetic ([place])   . Depression   . A-fib ([place])   . Calciphylaxis 05/2013    lower ext  . Diabetes mellitus     Type 1 iddm x 11 yrs  . Diabetic neuropathy ([place])   . Cancer ([place])     hx duodenal adenoCa 2002, resected    Current Outpatient Prescriptions  Medication Sig Dispense Refill  . amLODipine (NORVASC) 10 MG tablet Take 10 mg by mouth daily.    [name] aspirin EC 81 MG tablet Take 81 mg by  mouth daily.    [name] atorvastatin (LIPITOR) 10 MG tablet Take 1 tablet (10 mg total) by mouth daily. 30 tablet 1  . cholecalciferol (VITAMIN D) 1000 UNITS tablet Take 1,000 Units by mouth daily.    . insulin glargine (LANTUS) 100 UNIT/ML injection Inject 12 Units into the skin 2 (two) times daily.     . insulin lispro (HUMALOG) 100 UNIT/ML injection Inject 6 Units into the skin 3 (three) times daily before meals.     . magnesium chloride (SLOW-MAG) 64 MG TBEC SR tablet Take by mouth.    . mycophenolate (MYFORTIC) 180 MG EC tablet Take 180 mg by mouth 2 (two) times daily.    [name] omeprazole (PRILOSEC) 40 MG capsule     . predniSONE (DELTASONE) 5 MG tablet Take 5 mg by mouth daily with breakfast.     . tacrolimus (PROGRAF) 5 MG capsule Take 25 mg by mouth 2 (two) times daily.     . tamsulosin (FLOMAX) 0.4 MG CAPS capsule Take 0.4 mg by mouth daily after breakfast.      No current  facility-administered medications for this visit.    Allergies:   Metformin and related   Social History:  The patient  reports that he has never smoked. He has never used smokeless tobacco. He reports that he does not drink alcohol or use illicit drugs.   Family History:  The patient's family history includes Cancer in his father and mother; Heart disease in his maternal grandfather; Stroke in his paternal grandmother.   ROS:  Please see the history of present illness.      All other systems reviewed and negative.   PHYSICAL EXAM: VS:  BP 150/70 mmHg  Pulse 60  Ht 6' (1.829 m)  Wt 85.021 kg (187 lb 7 oz)  BMI 25.42 kg/m2 Well nourished, well developed, in no acute distress HEENT: normal Neck: no JVDat 90 degrees Cardiac:  normal S1, S2; RRR; grade 2/6 SEM RUSB>>LSB Lungs:  clear to auscultation bilaterally, no wheezing, rhonchi or rales Abd: soft, nontender, no hepatomegaly Ext:  Bilateral BKAs noted. Patient has bilateral prosthses. Skin: warm and dry Neuro:  CNs 2-12 intact, no focal abnormalities noted  Laboratory data:  Reviewed from 05/09/15: BUN 29, creatinine 1.2, Hgb 11.8. Cholesterol 143, triglycerides 36, HDL 67, LDL 69.  Ecg today shows NSR with possible old septal infarct. I have personally reviewed and interpreted this study.   ASSESSMENT AND PLAN:  1. Atrial Fibrillation x1 during period of multiple stressors in 2014:  Maintaining NSR.  No recurrence.   He is not a candidate for  coumadin due to concerns for coumadin skin necrosis.  He did have a bx in the hospital that was positive for calciphylaxis.  He is on ASA.  If he were to have recurrent Afib now he would be a candidate for a NOAC since renal function is now good.  CHADS2-VASc=3 (HTN, DM, vascular disease).  2. ESRD:  S/p successful renal transplant.  3. PAD s/p Bilateral BKA 4. Hyperlipidemia:  Continue statin. 5.   Disposition: F/u in one year

## 2015-06-05 NOTE — Patient Instructions (Signed)
Continue your current therapy  I will see you in one year   

## 2015-06-06 ENCOUNTER — Encounter: Payer: Self-pay | Admitting: Physical Therapy

## 2015-06-06 ENCOUNTER — Ambulatory Visit: Payer: Managed Care, Other (non HMO) | Admitting: Physical Therapy

## 2015-06-06 DIAGNOSIS — Z89512 Acquired absence of left leg below knee: Secondary | ICD-10-CM

## 2015-06-06 DIAGNOSIS — R269 Unspecified abnormalities of gait and mobility: Secondary | ICD-10-CM | POA: Diagnosis not present

## 2015-06-06 DIAGNOSIS — Z940 Kidney transplant status: Secondary | ICD-10-CM

## 2015-06-06 DIAGNOSIS — Z7409 Other reduced mobility: Secondary | ICD-10-CM

## 2015-06-06 DIAGNOSIS — R2689 Other abnormalities of gait and mobility: Secondary | ICD-10-CM

## 2015-06-06 DIAGNOSIS — R2681 Unsteadiness on feet: Secondary | ICD-10-CM

## 2015-06-06 DIAGNOSIS — R531 Weakness: Secondary | ICD-10-CM

## 2015-06-06 DIAGNOSIS — Z4789 Encounter for other orthopedic aftercare: Secondary | ICD-10-CM

## 2015-06-06 DIAGNOSIS — Z89511 Acquired absence of right leg below knee: Secondary | ICD-10-CM

## 2015-06-07 NOTE — Therapy (Signed)
[facility] [address] [place], [place], [address] Phone: [phone]   Fax:  [phone]  Physical Therapy Treatment  Patient Details  Name: Paul [name] MRN: [id number] Date of Birth: 03-13-1956 Referring Provider: Darrow Bussing, MD  Encounter Date: 06/06/2015      PT End of Session - 06/06/15 1705    Visit Number 10   Number of Visits 15   Date for PT Re-Evaluation 07/21/15   Authorization Type [employer]ign[employer]   PT Start Time 1616   PT Stop Time 1705   PT Time Calculation (min) 49 min   Equipment Utilized During Treatment Gait belt   Activity Tolerance Patient tolerated treatment well   Behavior During Therapy [facility] for tasks assessed/performed      Past Medical History  Diagnosis Date  . Open wound of both legs with complication     Pt has had progressive wounds of both LE's including gangrene of the toes and patchy necrosis of the calves.  [facility] Jimmey Ralph with hyperbaric O2 5d per week.  He is getting Na Thiosulfate with HD for suspected calciphylaxis. Pt says doctor's aren't sure if these ulcers were diabetic ulcers or calciphylaxis.  He underwent bilat BKA on 07/30/13.  He says that the woun  . ESRD on hemodialysis ([facility])     Pt has ESRD due to DM.  He had a L upper arm AVF prior to starting HD which was ligated due to L arm steal syndrome.  He started HD in Jan 2014 and did home HD.  The family couldn't cannulate the R arm AVF successfully so by mid 2014 they decided to switch to PD which was done late summer 2014.  In Jan 2015 he was admitted with FTT felt to be due to underdialysis on PD and PD was abandoned and he   . Anemia     esrd  . Hypertension     off of meds due to orthostatic hypotension  . Closed left arm fracture 1967  . Heart murmur   . Corneal abrasion, left   . GERD (gastroesophageal reflux disease)   . History of blood transfusion   . Glomerulosclerosis, diabetic  (HCC)   . Depression   . A-fib (HCC)   . Calciphylaxis 05/2013    lower ext  . Diabetes mellitus     Type 1 iddm x 11 yrs  . Diabetic neuropathy (HCC)   . Cancer (HCC)     hx duodenal adenoCa 2002, resected    Past Surgical History  Procedure Laterality Date  . Whipple procedure  2002    duodenal ca, Dr. Gerrit Friends  . Bone marrow biopsy  2012  . Renal biopsy, percutaneous  2012  . Tonsillectomy    . Portacath placement  2002  . Biliary diversion, external  2002  . Other surgical history      Cyst removed from back  . Av fistula placement  06/07/2011    Procedure: ARTERIOVENOUS (AV) FISTULA CREATION;  Surgeon: Chuck Hint, MD;  Location: [facility] OR;  Service: Vascular;  Laterality: Left;  . Ligation of arteriovenous  fistula  05/22/2012    Procedure: LIGATION OF ARTERIOVENOUS  FISTULA;  Surgeon: Pryor Ochoa, MD;  Location: [facility] OR;  Service: Vascular;  Laterality: Left;  Left brachio-cephalic fistula  . Insertion of dialysis catheter  05/22/2012    Procedure: INSERTION OF DIALYSIS CATHETER;  Surgeon: Pryor Ochoa, MD;  Location: [facility] OR;  Service: Vascular;  Laterality: N/A;  right internal jugular vein  . Av fistula placement  06/18/2012    Procedure: ARTERIOVENOUS (AV) FISTULA CREATION;  Surgeon: Mal Misty, MD;  Location: [place];  Service: Vascular;  Laterality: Right;  [place]  . Colonoscopy      Hx: of  . Capd insertion N/A 01/14/2013    Procedure: LAPAROSCOPIC INSERTION CONTINUOUS AMBULATORY PERITONEAL DIALYSIS  (CAPD) CATHETER;  Surgeon: Adin Hector, MD;  Location: [place];  Service: General;  Laterality: N/A;  . Vascular surgery    . Removal of a dialysis catheter    . Capd removal N/A 07/09/2013    Procedure: CONTINUOUS AMBULATORY PERITONEAL DIALYSIS  (CAPD) CATHETER REMOVAL;  Surgeon: Adin Hector, MD;  Location: [place];  Service: General;  Laterality: N/A;  . Amputation Bilateral 07/30/2013    Procedure: AMPUTATION BELOW KNEE;  Surgeon: Newt Minion, MD;  Location: [place];  Service: Orthopedics;  Laterality: Bilateral;  Bilateral Below Knee Amputations  . Amputation Bilateral 09/03/2013    Procedure: AMPUTATION BELOW KNEE;  Surgeon: Newt Minion, MD;  Location: [place];  Service: Orthopedics;  Laterality: Bilateral;  Bilateral Below Knee Amputation Revision  . Lower extremity angiogram N/A 06/14/2013    Procedure: LOWER EXTREMITY ANGIOGRAM;  Surgeon: Angelia Mould, MD;  [facility];  Service: Cardiovascular;  Laterality: N/A;  . Nephrectomy transplanted organ      There were no vitals filed for this visit.  Visit Diagnosis:  Abnormality of gait  Impaired functional mobility and activity tolerance  Weakness generalized  Unsteadiness  Balance problems  Encounter for prosthetic gait training  S/P bilateral BKA (below knee amputation) ([place])  Kidney transplant recipient      Subjective Assessment - 06/06/15 1622    Subjective (p) He fell on escelator going up. He had one foot on one step & 2nd foot on step below and began to twist with ability to stop it. Only injury is scrape to left shoulder.    Currently in Pain? (p) No/denies           Therapeutic Exercise: Discussion of fall to analyze potential why he fell on escalator. Standing with feet together with upper body trunk rotation, standing with one foot on 8" stool - alternate stepping / tapping point on stool with abduction / adduction. Discussed stopping at edge of escalator prior to stepping on step both ascend and descend.   Prosthetic Training:  Stairs with 2 rails working on shifting wt forward more with descend to load body weight directly over his foot lowering his body weight. 6" curb - ascend with plant foot 2-3" from edge of curb to improve motion of 2nd limb rising, descend with foot stepping to floor first 2-3" heel from edge instead of locking heel to curb. Patient improved control with above instruction and repetition.                          PT Short Term Goals - 05/23/15 1700    PT SHORT TERM GOAL #1   Title Patient demonstrates understanding of updated HEP (Target Date: 05/26/2015)   Baseline MET 05/23/2015   Time 4   Period Weeks   Status Achieved   PT SHORT TERM GOAL #2   Title Patient ambulates with prostheses only with head turns to scan environment maintaining path.  (Target Date: 05/26/2015)   Baseline MET 05/23/2015   Time 4   Period Weeks  Status Achieved   PT SHORT TERM GOAL #3   Title Patient able to reach 10" anteriorly and pick up object from floor safely without UE support.  (Target Date: 05/26/2015)   Baseline MET 05/23/2015   Time 4   Period Weeks   Status Achieved   PT SHORT TERM GOAL #4   Title Patient negotiates stairs with single rail safely with supervision.  (Target Date: 05/26/2015)   Baseline MET 05/23/2015   Time 4   Period Weeks   Status Achieved   PT SHORT TERM GOAL #5   Title Berg Balance >/= 42/56 (Target Date 06/23/2015)   Time 4   Period Weeks   Status New   Additional Short Term Goals   Additional Short Term Goals Yes   PT SHORT TERM GOAL #6   Title Patient ambulates 500' outside surfaces including negotiating ramps & curbs with prostheses only with supervision. (Target Date 06/23/2015)   Time 4   Period Weeks   Status New   PT SHORT TERM GOAL #7   Title Patient able to increase gait velocity at fast pace to >4.5 ft/sec to enable to safely cross a street. (Target Date 06/23/2015)           PT Long Term Goals - 04/27/15 1400    PT LONG TERM GOAL #1   Title Patient verbalizes & demonstrates understanding of ongoing fitness plan / HEP.  (Target Date: 07/21/2015)   Time 12   Period Weeks   Status New   PT LONG TERM GOAL #2   Title Berg Balance >45/56 to reduce risk of falls with standing activities. (Target Date: 07/21/2015)   Time 12   Period Weeks   Status New   PT LONG TERM GOAL #3   Title Functional Gait Assessment >20/30 to reduce fall risk  with gait activities. (Target Date: 07/21/2015)   Time 12   Period Weeks   Status New   PT LONG TERM GOAL #4   Title Patient negotiates ramps & curbs without device and stairs with single rail only with prostheses modified independent. (Target Date: 07/21/2015)   Time 12   Period Weeks   Status New               Plan - 06/06/15 1705    Clinical Impression Statement Patient seems to understand exercises to work on single leg stance stbility & rotation / twisting motion. Patient was in modified single leg stance position on escelator and attempted to correct which caused a rotation, twist. Patient improved descending stairs & curb with work on step length and weight distrbution.    Pt will benefit from skilled therapeutic intervention in order to improve on the following deficits Abnormal gait;Decreased balance;Decreased endurance;Decreased mobility;Decreased strength;Prosthetic Dependency;Increased fascial restricitons   Rehab Potential Good   PT Frequency Other (comment)  After eval, 2x/wk for 3 weeks, then 1x/wk for 9 weeks   PT Duration 12 weeks   PT Treatment/Interventions ADLs/Self Care Home Management;Gait training;Stair training;Functional mobility training;Therapeutic activities;Therapeutic exercise;Balance training;Neuromuscular re-education;Patient/family education;Prosthetic Training   PT Next Visit Plan prosthetic gait including barriers, strengthening and balance activities. frequency now 1x/wk as plan of care   Consulted and Agree with Plan of Care Patient        Problem List Patient Active Problem List   Diagnosis Date Noted  . Abnormal cardiovascular stress test 12/29/2013  . Awaiting organ transplant 12/09/2013  . Leg ulcer ([place]) 11/17/2013  . Chronic kidney disease (CKD), stage V ([place]) 11/17/2013  .  Diabetes ([place]) 11/17/2013  . Cancer of duodenum ([place]) 11/17/2013  . Dyslipidemia 11/17/2013  . Chronic kidney disease requiring chronic dialysis ([place]) 11/17/2013   . Hypercholesteremia 11/17/2013  . H/O malignant neoplasm 11/17/2013  . H/O: HTN (hypertension) 11/17/2013  . BP (high blood pressure) 11/17/2013  . Hypotension 11/17/2013  . Angiopathy, peripheral ([place]) 11/17/2013  . AF (paroxysmal atrial fibrillation) ([place]) 11/17/2013  . BKA stump complication ([place]) 04/05/3566  . S/P bilateral BKA (below knee amputation) ([place]) 08/03/2013  . S/P BKA (below knee amputation) bilateral ([place]) 07/30/2013  . Open wound of both legs with complication 01/41/0301  . Acute blood loss anemia 07/12/2013  . Syncope 07/09/2013  . Intolerance to CAPD peritoneal dialysis s/p CAPD cath removal 07/09/2013 07/05/2013  . Critical lower limb ischemia 06/30/2013  . Extremity pain 06/30/2013  . Atherosclerosis of native arteries of the extremities with ulceration(440.23) 06/17/2013  . Gait disorder 05/31/2013  . Weakness 05/24/2013  . Depression 04/03/2013  . GERD (gastroesophageal reflux disease) 04/03/2013  . Atrial fibrillation ([place]) 04/02/2013  . DM (diabetes mellitus) ([place]) 12/28/2012  . Other complications due to renal dialysis device, implant, and graft 05/19/2012  . Diabetes mellitus, type 2 ([place]) 11/13/2011  . Anemia 07/02/2011  . ESRD on hemodialysis ([place]) 05/29/2011    Thomasa Heidler PT, DPT 06/07/2015, 7:54 PM  [facility] [address] [place], [place], [address] Phone: [phone]   Fax:  [phone]  Name: DUNBAR BURAS MRN: [id number] Date of Birth: 1955/09/15

## 2015-06-13 ENCOUNTER — Encounter: Payer: Self-pay | Admitting: Physical Therapy

## 2015-06-13 ENCOUNTER — Ambulatory Visit: Payer: Managed Care, Other (non HMO) | Admitting: Physical Therapy

## 2015-06-13 DIAGNOSIS — R2681 Unsteadiness on feet: Secondary | ICD-10-CM

## 2015-06-13 DIAGNOSIS — R269 Unspecified abnormalities of gait and mobility: Secondary | ICD-10-CM | POA: Diagnosis not present

## 2015-06-13 DIAGNOSIS — R2689 Other abnormalities of gait and mobility: Secondary | ICD-10-CM

## 2015-06-13 DIAGNOSIS — Z89511 Acquired absence of right leg below knee: Secondary | ICD-10-CM

## 2015-06-13 DIAGNOSIS — Z7409 Other reduced mobility: Secondary | ICD-10-CM

## 2015-06-13 DIAGNOSIS — R531 Weakness: Secondary | ICD-10-CM

## 2015-06-13 DIAGNOSIS — R2991 Unspecified symptoms and signs involving the musculoskeletal system: Secondary | ICD-10-CM

## 2015-06-13 DIAGNOSIS — Z4789 Encounter for other orthopedic aftercare: Secondary | ICD-10-CM

## 2015-06-13 DIAGNOSIS — Z940 Kidney transplant status: Secondary | ICD-10-CM

## 2015-06-13 DIAGNOSIS — Z89512 Acquired absence of left leg below knee: Secondary | ICD-10-CM

## 2015-06-14 NOTE — Therapy (Signed)
[facility] [address] [place], [place], [address] Phone: [phone]   Fax:  [phone]  Physical Therapy Treatment  Patient Details  Name: Paul [name] MRN: [id number] Date of Birth: 09-19-1955 Referring Provider: [name], MD  Encounter Date: 06/13/2015      PT End of Session - 06/13/15 0556    Visit Number 11   Number of Visits 15   Date for PT Re-Evaluation 07/21/15   Authorization Type [employer]   PT Start Time 1628   PT Stop Time 1706   PT Time Calculation (min) 38 min   Equipment Utilized During Treatment Gait belt   Activity Tolerance Patient tolerated treatment well   Behavior During Therapy [facility] for tasks assessed/performed      Past Medical History  Diagnosis Date  . Open wound of both legs with complication     Pt has had progressive wounds of both LE's including gangrene of the toes and patchy necrosis of the calves.  He has been treated at [facility] by Dr Jerline Pain with hyperbaric O2 5d per week.  He is getting Na Thiosulfate with HD for suspected calciphylaxis. Pt says doctor's aren't sure if these ulcers were diabetic ulcers or calciphylaxis.  He underwent bilat BKA on 07/30/13.  He says that the woun  . ESRD on hemodialysis ([place])     Pt has ESRD due to DM.  He had a L upper arm AVF prior to starting HD which was ligated due to L arm steal syndrome.  He started HD in Jan 2014 and did home HD.  The family couldn't cannulate the R arm AVF successfully so by mid 2014 they decided to switch to PD which was done late summer 2014.  In Jan 2015 he was admitted with FTT felt to be due to underdialysis on PD and PD was abandoned and he   . Anemia     esrd  . Hypertension     off of meds due to orthostatic hypotension  . Closed left arm fracture 1967  . Heart murmur   . Corneal abrasion, left   . GERD (gastroesophageal reflux disease)   . History of blood transfusion   . Glomerulosclerosis, diabetic  ([place])   . Depression   . A-fib ([place])   . Calciphylaxis 05/2013    lower ext  . Diabetes mellitus     Type 1 iddm x 11 yrs  . Diabetic neuropathy ([place])   . Cancer ([place])     hx duodenal adenoCa 2002, resected    Past Surgical History  Procedure Laterality Date  . Whipple procedure  2002    duodenal ca, Dr. Harlow Asa  . Bone marrow biopsy  2012  . Renal biopsy, percutaneous  2012  . Tonsillectomy    . Portacath placement  2002  . Biliary diversion, external  2002  . Other surgical history      Cyst removed from back  . Av fistula placement  06/07/2011    Procedure: ARTERIOVENOUS (AV) FISTULA CREATION;  Surgeon: Angelia Mould, MD;  Location: [facility];  Service: Vascular;  Laterality: Left;  . Ligation of arteriovenous  fistula  05/22/2012    Procedure: LIGATION OF ARTERIOVENOUS  FISTULA;  Surgeon: Mal Misty, MD;  Location: [facility];  Service: Vascular;  Laterality: Left;  Left brachio-cephalic fistula  . Insertion of dialysis catheter  05/22/2012    Procedure: INSERTION OF DIALYSIS CATHETER;  Surgeon: Mal Misty, MD;  Location: [facility];  Service: Vascular;  Laterality: N/A;  right internal jugular vein  . Av fistula placement  06/18/2012    Procedure: ARTERIOVENOUS (AV) FISTULA CREATION;  Surgeon: Mal Misty, MD;  Location: [facility];  Service: Vascular;  Laterality: Right;  [place]  . Colonoscopy      Hx: of  . Capd insertion N/A 01/14/2013    Procedure: LAPAROSCOPIC INSERTION CONTINUOUS AMBULATORY PERITONEAL DIALYSIS  (CAPD) CATHETER;  Surgeon: Adin Hector, MD;  Location: [facility];  Service: General;  Laterality: N/A;  . Vascular surgery    . Removal of a dialysis catheter    . Capd removal N/A 07/09/2013    Procedure: CONTINUOUS AMBULATORY PERITONEAL DIALYSIS  (CAPD) CATHETER REMOVAL;  Surgeon: Adin Hector, MD;  Location: [place];  Service: General;  Laterality: N/A;  . Amputation Bilateral 07/30/2013    Procedure: AMPUTATION BELOW KNEE;  Surgeon: Newt Minion, MD;  Location: [facility];  Service: Orthopedics;  Laterality: Bilateral;  Bilateral Below Knee Amputations  . Amputation Bilateral 09/03/2013    Procedure: AMPUTATION BELOW KNEE;  Surgeon: Newt Minion, MD;  Location: [facility];  Service: Orthopedics;  Laterality: Bilateral;  Bilateral Below Knee Amputation Revision  . Lower extremity angiogram N/A 06/14/2013    Procedure: LOWER EXTREMITY ANGIOGRAM;  Surgeon: Angelia Mould, MD;  Location: [facility] CATH LAB;  Service: Cardiovascular;  Laterality: N/A;  . Nephrectomy transplanted organ      There were no vitals filed for this visit.  Visit Diagnosis:  Abnormality of gait  Impaired functional mobility and activity tolerance  Weakness generalized  Unsteadiness  Balance problems  Encounter for prosthetic gait training  S/P bilateral BKA (below knee amputation) ([place])  Kidney transplant recipient  Impaired transfers      Subjective Assessment - 06/13/15 1629    Subjective (p) No falls. He has been doing exercises.    Currently in Pain? (p) No/denies     Prosthetic Training with bilateral Transtibial Amputation prostheses: PT demo & instructed prior and correctional cues with turns 90* & 180*. Patient performed near counter for safety with SBA & ocassional touch on counter for stabilization for balance loss.  Neuromuscular Re-Education: Modified single leg stance activities: Standing near wall (anterior) with single fingertip support: rolling tennis ball with RLE & LLE. Alternate placing single LE on 6" stool, forward and laterally & hold position for 5-7 sec, then removing prosthesis from stool initiating with hip flexion (Ocassional touch on parallel bars) PT demo set-up for home with standing in front of wall with chair back to side. Pt verbalized understanding.  Sit to /from stand from chair without armrests pushing off single UE placed between LEs to upright without touching or pressing back of legs against chair to stabilize.                                PT Short Term Goals - 05/23/15 1700    PT SHORT TERM GOAL #1   Title Patient demonstrates understanding of updated HEP (Target Date: 05/26/2015)   Baseline MET 05/23/2015   Time 4   Period Weeks   Status Achieved   PT SHORT TERM GOAL #2   Title Patient ambulates with prostheses only with head turns to scan environment maintaining path.  (Target Date: 05/26/2015)   Baseline MET 05/23/2015   Time 4   Period Weeks   Status Achieved   PT SHORT TERM GOAL #3   Title Patient  able to reach 10" anteriorly and pick up object from floor safely without UE support.  (Target Date: 05/26/2015)   Baseline MET 05/23/2015   Time 4   Period Weeks   Status Achieved   PT SHORT TERM GOAL #4   Title Patient negotiates stairs with single rail safely with supervision.  (Target Date: 05/26/2015)   Baseline MET 05/23/2015   Time 4   Period Weeks   Status Achieved   PT SHORT TERM GOAL #5   Title Berg Balance >/= 42/56 (Target Date 06/23/2015)   Time 4   Period Weeks   Status New   Additional Short Term Goals   Additional Short Term Goals Yes   PT SHORT TERM GOAL #6   Title Patient ambulates 500' outside surfaces including negotiating ramps & curbs with prostheses only with supervision. (Target Date 06/23/2015)   Time 4   Period Weeks   Status New   PT SHORT TERM GOAL #7   Title Patient able to increase gait velocity at fast pace to >4.5 ft/sec to enable to safely cross a street. (Target Date 06/23/2015)           PT Long Term Goals - 04/27/15 1400    PT LONG TERM GOAL #1   Title Patient verbalizes & demonstrates understanding of ongoing fitness plan / HEP.  (Target Date: 07/21/2015)   Time 12   Period Weeks   Status New   PT LONG TERM GOAL #2   Title Berg Balance >45/56 to reduce risk of falls with standing activities. (Target Date: 07/21/2015)   Time 12   Period Weeks   Status New   PT LONG TERM GOAL #3   Title Functional Gait Assessment >20/30 to reduce  fall risk with gait activities. (Target Date: 07/21/2015)   Time 12   Period Weeks   Status New   PT LONG TERM GOAL #4   Title Patient negotiates ramps & curbs without device and stairs with single rail only with prostheses modified independent. (Target Date: 07/21/2015)   Time 12   Period Weeks   Status New               Plan - 06/13/15 1710    Clinical Impression Statement Patient improved balance with turning 90* & 180* with instruction & repetition with correctional cues. Patient was challenged by modified single leg stance activities but improved balance with tactile cues. Pt is on target to meet STGs next week.    Pt will benefit from skilled therapeutic intervention in order to improve on the following deficits Abnormal gait;Decreased balance;Decreased endurance;Decreased mobility;Decreased strength;Prosthetic Dependency;Increased fascial restricitons   Rehab Potential Good   PT Frequency Other (comment)  After eval, 2x/wk for 3 weeks, then 1x/wk for 9 weeks   PT Duration 12 weeks   PT Treatment/Interventions ADLs/Self Care Home Management;Gait training;Stair training;Functional mobility training;Therapeutic activities;Therapeutic exercise;Balance training;Neuromuscular re-education;Patient/family education;Prosthetic Training   PT Next Visit Plan Assess STGs, prosthetic gait including barriers, strengthening and balance activities. frequency now 1x/wk as plan of care   Consulted and Agree with Plan of Care Patient        Problem List Patient Active Problem List   Diagnosis Date Noted  . Abnormal cardiovascular stress test 12/29/2013  . Awaiting organ transplant 12/09/2013  . Leg ulcer ([place]) 11/17/2013  . Chronic kidney disease (CKD), stage V ([place]) 11/17/2013  . Diabetes ([place]) 11/17/2013  . Cancer of duodenum ([place]) 11/17/2013  . Dyslipidemia 11/17/2013  . Chronic kidney disease requiring chronic  dialysis ([place]) 11/17/2013  . Hypercholesteremia 11/17/2013  . H/O  malignant neoplasm 11/17/2013  . H/O: HTN (hypertension) 11/17/2013  . BP (high blood pressure) 11/17/2013  . Hypotension 11/17/2013  . Angiopathy, peripheral ([place]) 11/17/2013  . AF (paroxysmal atrial fibrillation) ([place]) 11/17/2013  . BKA stump complication ([place]) 56/15/3794  . S/P bilateral BKA (below knee amputation) ([place]) 08/03/2013  . S/P BKA (below knee amputation) bilateral ([place]) 07/30/2013  . Open wound of both legs with complication 32/76/1470  . Acute blood loss anemia 07/12/2013  . Syncope 07/09/2013  . Intolerance to CAPD peritoneal dialysis s/p CAPD cath removal 07/09/2013 07/05/2013  . Critical lower limb ischemia 06/30/2013  . Extremity pain 06/30/2013  . Atherosclerosis of native arteries of the extremities with ulceration(440.23) 06/17/2013  . Gait disorder 05/31/2013  . Weakness 05/24/2013  . Depression 04/03/2013  . GERD (gastroesophageal reflux disease) 04/03/2013  . Atrial fibrillation ([place]) 04/02/2013  . DM (diabetes mellitus) ([place]) 12/28/2012  . Other complications due to renal dialysis device, implant, and graft 05/19/2012  . Diabetes mellitus, type 2 ([place]) 11/13/2011  . Anemia 07/02/2011  . ESRD on hemodialysis ([employer]) 05/29/2011    Cathaleen Korol PT, DPT 06/14/2015, 6:00 AM  [facility] [address] [place], [place], [address] Phone: [phone]   Fax:  [phone]  Name: Paul [name] MRN: [id number] Date of Birth: [birth year]

## 2015-06-20 ENCOUNTER — Ambulatory Visit: Payer: Managed Care, Other (non HMO) | Admitting: Physical Therapy

## 2015-06-20 DIAGNOSIS — R2689 Other abnormalities of gait and mobility: Secondary | ICD-10-CM

## 2015-06-20 DIAGNOSIS — Z89512 Acquired absence of left leg below knee: Secondary | ICD-10-CM

## 2015-06-20 DIAGNOSIS — Z940 Kidney transplant status: Secondary | ICD-10-CM

## 2015-06-20 DIAGNOSIS — R269 Unspecified abnormalities of gait and mobility: Secondary | ICD-10-CM

## 2015-06-20 DIAGNOSIS — R2991 Unspecified symptoms and signs involving the musculoskeletal system: Secondary | ICD-10-CM

## 2015-06-20 DIAGNOSIS — R531 Weakness: Secondary | ICD-10-CM

## 2015-06-20 DIAGNOSIS — Z4789 Encounter for other orthopedic aftercare: Secondary | ICD-10-CM

## 2015-06-20 DIAGNOSIS — Z7409 Other reduced mobility: Secondary | ICD-10-CM

## 2015-06-20 DIAGNOSIS — Z89511 Acquired absence of right leg below knee: Secondary | ICD-10-CM

## 2015-06-20 DIAGNOSIS — R2681 Unsteadiness on feet: Secondary | ICD-10-CM

## 2015-06-21 NOTE — Therapy (Signed)
[facility] [address] [place], [place], [address] Phone: [phone]   Fax:  [phone]  Physical Therapy Treatment  Patient Details  Name: Paul [name] MRN: [id number] Date of Birth: Sep 28, 1955 Referring Provider: [name], MD  Encounter Date: 06/20/2015      PT End of Session - 06/20/15 1700    Visit Number 12   Number of Visits 15   Date for PT Re-Evaluation 07/21/15   Authorization Type [employer]   PT Start Time 1620   PT Stop Time 1705   PT Time Calculation (min) 45 min   Equipment Utilized During Treatment Gait belt   Activity Tolerance Patient tolerated treatment well   Behavior During Therapy [facility] for tasks assessed/performed      Past Medical History  Diagnosis Date  . Open wound of both legs with complication     Pt has had progressive wounds of both LE's including gangrene of the toes and patchy necrosis of the calves.  He has been treated at [facility] by Dr Jerline Pain with hyperbaric O2 5d per week.  He is getting Na Thiosulfate with HD for suspected calciphylaxis. Pt says doctor's aren't sure if these ulcers were diabetic ulcers or calciphylaxis.  He underwent bilat BKA on 07/30/13.  He says that the woun  . ESRD on hemodialysis ([place])     Pt has ESRD due to DM.  He had a L upper arm AVF prior to starting HD which was ligated due to L arm steal syndrome.  He started HD in Jan 2014 and did home HD.  The family couldn't cannulate the R arm AVF successfully so by mid 2014 they decided to switch to PD which was done late summer 2014.  In Jan 2015 he was admitted with FTT felt to be due to underdialysis on PD and PD was abandoned and he   . Anemia     esrd  . Hypertension     off of meds due to orthostatic hypotension  . Closed left arm fracture 1967  . Heart murmur   . Corneal abrasion, left   . GERD (gastroesophageal reflux disease)   . History of blood transfusion   . Glomerulosclerosis, diabetic  ([place])   . Depression   . A-fib ([place])   . Calciphylaxis 05/2013    lower ext  . Diabetes mellitus     Type 1 iddm x 11 yrs  . Diabetic neuropathy ([place])   . Cancer ([place])     hx duodenal adenoCa 2002, resected    Past Surgical History  Procedure Laterality Date  . Whipple procedure  2002    duodenal ca, Dr. Harlow Asa  . Bone marrow biopsy  2012  . Renal biopsy, percutaneous  2012  . Tonsillectomy    . Portacath placement  2002  . Biliary diversion, external  2002  . Other surgical history      Cyst removed from back  . Av fistula placement  06/07/2011    Procedure: ARTERIOVENOUS (AV) FISTULA CREATION;  Surgeon: Angelia Mould, MD;  Location: [facility];  Service: Vascular;  Laterality: Left;  . Ligation of arteriovenous  fistula  05/22/2012    Procedure: LIGATION OF ARTERIOVENOUS  FISTULA;  Surgeon: Mal Misty, MD;  Location: [facility] [facility];  Service: Vascular;  Laterality: Left;  Left brachio-cephalic fistula  . Insertion of dialysis catheter  05/22/2012    Procedure: INSERTION OF DIALYSIS CATHETER;  Surgeon: Mal Misty, MD;  Location: [facility];  Service: Vascular;  Laterality: N/A;  right internal jugular vein  . Av fistula placement  06/18/2012    Procedure: ARTERIOVENOUS (AV) FISTULA CREATION;  Surgeon: Mal Misty, MD;  Location: [facility];  Service: Vascular;  Laterality: Right;  [place]  . Colonoscopy      Hx: of  . Capd insertion N/A 01/14/2013    Procedure: LAPAROSCOPIC INSERTION CONTINUOUS AMBULATORY PERITONEAL DIALYSIS  (CAPD) CATHETER;  Surgeon: Adin Hector, MD;  Location: [facility];  Service: General;  Laterality: N/A;  . Vascular surgery    . Removal of a dialysis catheter    . Capd removal N/A 07/09/2013    Procedure: CONTINUOUS AMBULATORY PERITONEAL DIALYSIS  (CAPD) CATHETER REMOVAL;  Surgeon: Adin Hector, MD;  Location: [facility];  Service: General;  Laterality: N/A;  . Amputation Bilateral 07/30/2013    Procedure: AMPUTATION BELOW KNEE;  Surgeon: Newt Minion, MD;  Location: [facility];  Service: Orthopedics;  Laterality: Bilateral;  Bilateral Below Knee Amputations  . Amputation Bilateral 09/03/2013    Procedure: AMPUTATION BELOW KNEE;  Surgeon: Newt Minion, MD;  Location: [facility];  Service: Orthopedics;  Laterality: Bilateral;  Bilateral Below Knee Amputation Revision  . Lower extremity angiogram N/A 06/14/2013    Procedure: LOWER EXTREMITY ANGIOGRAM;  Surgeon: Angelia Mould, MD;  Location: [facility] CATH LAB;  Service: Cardiovascular;  Laterality: N/A;  . Nephrectomy transplanted organ      There were no vitals filed for this visit.  Visit Diagnosis:  Abnormality of gait  Impaired functional mobility and activity tolerance  Weakness generalized  Unsteadiness  Balance problems  Encounter for prosthetic gait training  S/P bilateral BKA (below knee amputation) ([place])  Kidney transplant recipient  Impaired transfers          [facility] PT Assessment - 06/20/15 1615    Berg Balance Test   Sit to Stand Able to stand without using hands and stabilize independently   Standing Unsupported Able to stand safely 2 minutes   Sitting with Back Unsupported but Feet Supported on Floor or Stool Able to sit safely and securely 2 minutes   Stand to Sit Sits safely with minimal use of hands   Transfers Able to transfer safely, minor use of hands   Standing Unsupported with Eyes Closed Able to stand 10 seconds safely   Standing Ubsupported with Feet Together Able to place feet together independently and stand for 1 minute with supervision   From Standing, Reach Forward with Outstretched Arm Can reach confidently >25 cm (10")   From Standing Position, Pick up Object from Floor Able to pick up shoe safely and easily   From Standing Position, Turn to Look Behind Over each Shoulder Looks behind from both sides and weight shifts well   Turn 360 Degrees Able to turn 360 degrees safely but slowly   Standing Unsupported, Alternately Place Feet on Step/Stool Able to complete >2  steps/needs minimal assist   Standing Unsupported, One Foot in Front Able to take small step independently and hold 30 seconds   Standing on One Leg Tries to lift leg/unable to hold 3 seconds but remains standing independently   Total Score 45                     [facility] Adult PT Treatment/Exercise - 06/20/15 1615    Transfers   Transfers Sit to Stand;Stand to Sit   Sit to Stand 5: Supervision;With upper extremity assist;With armrests;From chair/3-in-1;Without upper extremity assist  able  to arise without UE assist with cues   Stand to Sit 5: Supervision;With upper extremity assist;With armrests;To chair/3-in-1;Without upper extremity assist  sits in chairs without armrests   Ambulation/Gait   Ambulation/Gait Yes   Ambulation/Gait Assistance 5: Supervision   Ambulation/Gait Assistance Details worked on increasing speed and turning to change directions.    Ambulation Distance (Feet) 500 Feet   Assistive device Prostheses   Gait Pattern Step-through pattern;Decreased stride length;Lateral hip instability;Trunk flexed;Wide base of support   Ambulation Surface Indoor;Level;Outdoor;Unlevel;Paved;Gravel;Grass   Gait velocity 3.21 ft/sec comfortable; 4.51 ft/sec fast with min Guard due to instability   Stairs Assistance --   Stair Management Technique --   Number of Stairs --   Ramp 5: Supervision  prostheses only   Curb 5: Supervision  prostheses only   Curb Details (indicate cue type and reason) outdoors, verbal cues on wt shift & posture   High Level Balance   High Level Balance Activities --   High Level Balance Comments --   Neuro Re-ed    Neuro Re-ed Details  HEP: counter standing 10-15 seconds with one foot (forefoot) on lower shelf of counter and alternating placing foot into counter.    Prosthetics   Prosthetic Care Comments  Prosthetist, Brooke Pace, present and corrected left prosthesis toe-out. Discussion about increasing pt's ht (pt reports currently 2" shorter  than prior to amputations which makes pants too long). Plan established to raise his height 1/2" every 2 weeks and monitor balance & mobliity. Patient also would benefit from socket absorption rotators in pylons and needs socket revisions.     Current prosthetic wear tolerance (days/week)  7 days /wk   Current prosthetic wear tolerance (#hours/day)  all awake hours   Edema none   Residual limb condition  no issues   Education Provided Other (comment)  See prosthetic care   Person(s) Educated Patient;Spouse   Education Method Explanation   Education Method Verbalized understanding                  PT Short Term Goals - 06/20/15 1705    PT SHORT TERM GOAL #1   Title Patient demonstrates understanding of updated HEP (Target Date: 05/26/2015)   Baseline MET 05/23/2015   Time 4   Period Weeks   Status Achieved   PT SHORT TERM GOAL #2   Title Patient ambulates with prostheses only with head turns to scan environment maintaining path.  (Target Date: 05/26/2015)   Baseline MET 05/23/2015   Time 4   Period Weeks   Status Achieved   PT SHORT TERM GOAL #3   Title Patient able to reach 10" anteriorly and pick up object from floor safely without UE support.  (Target Date: 05/26/2015)   Baseline MET 05/23/2015   Time 4   Period Weeks   Status Achieved   PT SHORT TERM GOAL #4   Title Patient negotiates stairs with single rail safely with supervision.  (Target Date: 05/26/2015)   Baseline MET 05/23/2015   Time 4   Period Weeks   Status Achieved   PT SHORT TERM GOAL #5   Title Berg Balance >/= 42/56 (Target Date 06/23/2015)   Baseline MET 06/20/2015 Berg Balance 45/56   Time 4   Period Weeks   Status Achieved   PT SHORT TERM GOAL #6   Title Patient ambulates 500' outside surfaces including negotiating ramps & curbs with prostheses only with supervision. (Target Date 06/23/2015)   Baseline MET 06/19/2014    Time 4  Period Weeks   Status Achieved   PT SHORT TERM GOAL #7   Title Patient able to  increase gait velocity at fast pace to >4.5 ft/sec to enable to safely cross a street. (Target Date 06/23/2015)   Baseline MET 06/20/2015 Fast gait velocity 4.51 ft/sec   Status Achieved           PT Long Term Goals - 06/20/15 1705    PT LONG TERM GOAL #1   Title Patient verbalizes & demonstrates understanding of ongoing fitness plan / HEP.  (Target Date: 07/21/2015)   Time 12   Period Weeks   Status On-going   PT LONG TERM GOAL #2   Title Berg Balance >45/56 to reduce risk of falls with standing activities. (Target Date: 07/21/2015)   Time 12   Period Weeks   Status On-going   PT LONG TERM GOAL #3   Title Functional Gait Assessment >20/30 to reduce fall risk with gait activities. (Target Date: 07/21/2015)   Time 12   Period Weeks   Status On-going   PT LONG TERM GOAL #4   Title Patient negotiates ramps & curbs without device and stairs with single rail only with prostheses modified independent. (Target Date: 07/21/2015)   Time 12   Period Weeks   Status On-going               Plan - 06/20/15 1705    Clinical Impression Statement Patient appears would benefit from shock absorption in pylon to decrease stress on back & hips and with rotators to improve torque during gait & ADLs including golf. Patient would like to increase his height back to previous height which taller height could cause balance issues. However his balance is to the point that trial of higher height with 1/2" increments would be safe.    Pt will benefit from skilled therapeutic intervention in order to improve on the following deficits Abnormal gait;Decreased balance;Decreased endurance;Decreased mobility;Decreased strength;Prosthetic Dependency;Increased fascial restricitons   Rehab Potential Good   PT Frequency Other (comment)  After eval, 2x/wk for 3 weeks, then 1x/wk for 9 weeks   PT Duration 12 weeks   PT Treatment/Interventions ADLs/Self Care Home Management;Gait training;Stair training;Functional mobility  training;Therapeutic activities;Therapeutic exercise;Balance training;Neuromuscular re-education;Patient/family education;Prosthetic Training   PT Next Visit Plan Work towards LTGs, prosthetic gait including barriers, strengthening and balance activities. frequency now 1x/wk as plan of care   Consulted and Agree with Plan of Care Patient        Problem List Patient Active Problem List   Diagnosis Date Noted  . Abnormal cardiovascular stress test 12/29/2013  . Awaiting organ transplant 12/09/2013  . Leg ulcer ([place]) 11/17/2013  . Chronic kidney disease (CKD), stage V ([place]) 11/17/2013  . Diabetes ([place]) 11/17/2013  . Cancer of duodenum ([place]) 11/17/2013  . Dyslipidemia 11/17/2013  . Chronic kidney disease requiring chronic dialysis ([place]) 11/17/2013  . Hypercholesteremia 11/17/2013  . H/O malignant neoplasm 11/17/2013  . H/O: HTN (hypertension) 11/17/2013  . BP (high blood pressure) 11/17/2013  . Hypotension 11/17/2013  . Angiopathy, peripheral ([place]) 11/17/2013  . AF (paroxysmal atrial fibrillation) ([place]) 11/17/2013  . BKA stump complication ([place]) 54/01/8118  . S/P bilateral BKA (below knee amputation) ([place]) 08/03/2013  . S/P BKA (below knee amputation) bilateral ([place]) 07/30/2013  . Open wound of both legs with complication 14/78/2956  . Acute blood loss anemia 07/12/2013  . Syncope 07/09/2013  . Intolerance to CAPD peritoneal dialysis s/p CAPD cath removal 07/09/2013 07/05/2013  . Critical lower limb ischemia  06/30/2013  . Extremity pain 06/30/2013  . Atherosclerosis of native arteries of the extremities with ulceration(440.23) 06/17/2013  . Gait disorder 05/31/2013  . Weakness 05/24/2013  . Depression 04/03/2013  . GERD (gastroesophageal reflux disease) 04/03/2013  . Atrial fibrillation ([place]) 04/02/2013  . DM (diabetes mellitus) ([place]) 12/28/2012  . Other complications due to renal dialysis device, implant, and graft 05/19/2012  . Diabetes mellitus, type 2 ([place]) 11/13/2011  .  Anemia 07/02/2011  . ESRD on hemodialysis ([place]) 05/29/2011    Bonna Steury PT, DPT 06/21/2015, 1:44 PM  [facility] [address] [place], [place], [address] Phone: [phone]   Fax:  [phone]  Name: Paul [name] MRN: [id number] Date of Birth: 1955-06-09

## 2015-06-27 ENCOUNTER — Ambulatory Visit: Payer: Managed Care, Other (non HMO) | Attending: Family Medicine | Admitting: Physical Therapy

## 2015-06-27 ENCOUNTER — Encounter: Payer: Self-pay | Admitting: Physical Therapy

## 2015-06-27 DIAGNOSIS — Z89511 Acquired absence of right leg below knee: Secondary | ICD-10-CM | POA: Diagnosis present

## 2015-06-27 DIAGNOSIS — R29818 Other symptoms and signs involving the nervous system: Secondary | ICD-10-CM | POA: Diagnosis present

## 2015-06-27 DIAGNOSIS — Z89512 Acquired absence of left leg below knee: Secondary | ICD-10-CM | POA: Insufficient documentation

## 2015-06-27 DIAGNOSIS — R2991 Unspecified symptoms and signs involving the musculoskeletal system: Secondary | ICD-10-CM | POA: Diagnosis present

## 2015-06-27 DIAGNOSIS — Z7409 Other reduced mobility: Secondary | ICD-10-CM | POA: Diagnosis present

## 2015-06-27 DIAGNOSIS — Z5189 Encounter for other specified aftercare: Secondary | ICD-10-CM | POA: Insufficient documentation

## 2015-06-27 DIAGNOSIS — R269 Unspecified abnormalities of gait and mobility: Secondary | ICD-10-CM | POA: Insufficient documentation

## 2015-06-27 DIAGNOSIS — Z940 Kidney transplant status: Secondary | ICD-10-CM | POA: Insufficient documentation

## 2015-06-27 DIAGNOSIS — R531 Weakness: Secondary | ICD-10-CM | POA: Diagnosis present

## 2015-06-27 DIAGNOSIS — Z4789 Encounter for other orthopedic aftercare: Secondary | ICD-10-CM

## 2015-06-27 DIAGNOSIS — R2681 Unsteadiness on feet: Secondary | ICD-10-CM | POA: Diagnosis present

## 2015-06-27 DIAGNOSIS — R2689 Other abnormalities of gait and mobility: Secondary | ICD-10-CM

## 2015-06-28 NOTE — Therapy (Signed)
[facility] [address] [place], [place], [address] Phone: [phone]   Fax:  [phone]  Physical Therapy Treatment  Patient Details  Name: Paul [name] MRN: [id number] Date of Birth: [birth year] Referring Provider: [name], MD  Encounter Date: 06/27/2015      PT End of Session - 06/27/15 1710    Visit Number 13   Number of Visits 16   Authorization Type [employer] -    Authorization Time Period Frequency switched to 1/wk every 2-3 weeks to enable ht changes with PT treatment following   PT Start Time 1620   PT Stop Time 1705   PT Time Calculation (min) 45 min   Equipment Utilized During Treatment Gait belt   Activity Tolerance Patient tolerated treatment well   Behavior During Therapy [facility] for tasks assessed/performed      Past Medical History  Diagnosis Date  . Open wound of both legs with complication     Pt has had progressive wounds of both LE's including gangrene of the toes and patchy necrosis of the calves.  He has been treated at [facility] by Dr Jerline Pain with hyperbaric O2 5d per week.  He is getting Na Thiosulfate with HD for suspected calciphylaxis. Pt says doctor's aren't sure if these ulcers were diabetic ulcers or calciphylaxis.  He underwent bilat BKA on 07/30/13.  He says that the woun  . ESRD on hemodialysis ([place])     Pt has ESRD due to DM.  He had a L upper arm AVF prior to starting HD which was ligated due to L arm steal syndrome.  He started HD in Jan 2014 and did home HD.  The family couldn't cannulate the R arm AVF successfully so by mid 2014 they decided to switch to PD which was done late summer 2014.  In Jan 2015 he was admitted with FTT felt to be due to underdialysis on PD and PD was abandoned and he   . Anemia     esrd  . Hypertension     off of meds due to orthostatic hypotension  . Closed left arm fracture 1967  . Heart murmur   . Corneal abrasion, left   . GERD (gastroesophageal  reflux disease)   . History of blood transfusion   . Glomerulosclerosis, diabetic ([place])   . Depression   . A-fib ([place])   . Calciphylaxis 05/2013    lower ext  . Diabetes mellitus     Type 1 iddm x 11 yrs  . Diabetic neuropathy ([place])   . Cancer ([place])     hx duodenal adenoCa 2002, resected    Past Surgical History  Procedure Laterality Date  . Whipple procedure  2002    duodenal ca, Dr. Harlow Asa  . Bone marrow biopsy  2012  . Renal biopsy, percutaneous  2012  . Tonsillectomy    . Portacath placement  2002  . Biliary diversion, external  2002  . Other surgical history      Cyst removed from back  . Av fistula placement  06/07/2011    Procedure: ARTERIOVENOUS (AV) FISTULA CREATION;  Surgeon: Angelia Mould, MD;  Location: [facility];  Service: Vascular;  Laterality: Left;  . Ligation of arteriovenous  fistula  05/22/2012    Procedure: LIGATION OF ARTERIOVENOUS  FISTULA;  Surgeon: Mal Misty, MD;  Location: [facility];  Service: Vascular;  Laterality: Left;  Left brachio-cephalic fistula  . Insertion of dialysis catheter  05/22/2012  Procedure: INSERTION OF DIALYSIS CATHETER;  Surgeon: Mal Misty, MD;  Location: [facility];  Service: Vascular;  Laterality: N/A;  right internal jugular vein  . Av fistula placement  06/18/2012    Procedure: ARTERIOVENOUS (AV) FISTULA CREATION;  Surgeon: Mal Misty, MD;  Location: [facility];  Service: Vascular;  Laterality: Right;  [place]  . Colonoscopy      Hx: of  . Capd insertion N/A 01/14/2013    Procedure: LAPAROSCOPIC INSERTION CONTINUOUS AMBULATORY PERITONEAL DIALYSIS  (CAPD) CATHETER;  Surgeon: Adin Hector, MD;  Location: [facility];  Service: General;  Laterality: N/A;  . Vascular surgery    . Removal of a dialysis catheter    . Capd removal N/A 07/09/2013    Procedure: CONTINUOUS AMBULATORY PERITONEAL DIALYSIS  (CAPD) CATHETER REMOVAL;  Surgeon: Adin Hector, MD;  Location: [facility];  Service: General;  Laterality: N/A;  . Amputation Bilateral  07/30/2013    Procedure: AMPUTATION BELOW KNEE;  Surgeon: Newt Minion, MD;  Location: [facility];  Service: Orthopedics;  Laterality: Bilateral;  Bilateral Below Knee Amputations  . Amputation Bilateral 09/03/2013    Procedure: AMPUTATION BELOW KNEE;  Surgeon: Newt Minion, MD;  Location: [facility];  Service: Orthopedics;  Laterality: Bilateral;  Bilateral Below Knee Amputation Revision  . Lower extremity angiogram N/A 06/14/2013    Procedure: LOWER EXTREMITY ANGIOGRAM;  Surgeon: Angelia Mould, MD;  Location: [facility] CATH LAB;  Service: Cardiovascular;  Laterality: N/A;  . Nephrectomy transplanted organ      There were no vitals filed for this visit.  Visit Diagnosis:  Abnormality of gait  Impaired functional mobility and activity tolerance  Weakness generalized  Unsteadiness  Balance problems  Encounter for prosthetic gait training  S/P bilateral BKA (below knee amputation) ([place])  Kidney transplant recipient  Impaired transfers      Subjective Assessment - 06/27/15 1621    Subjective (p) prosthetist raised his prostheses 1/2" on Thursday which has challenged his balance but not caused any falls.    Currently in Pain? (p) No/denies     Prosthetic Training with bilateral Transtibial Amputation prostheses (06/22/15 ht increased 1/2" per pt request to return to prior ht which is total of 2") Sit to/from stand with light UE assist and stabilizing without UE support. Turning 180* while ambulating with supervision / cues on wt shift. Backwards & side stepping with supervision.  Stairs with 2 rails reciprocal with cues on step length & technique. Ramps without device with supervision / cues on posture & wt shift. Curbs with close supervision due to instability with cues on technique including step length, changing which LE is lead limb. Patient ambulated 1000' including grass & grass slope without device with supervision & cues on technique. Stepping over park curb stop with cues on  technique to increase knee flexion with longer LEs - switching which is lead limb.                               PT Short Term Goals - 06/20/15 1705    PT SHORT TERM GOAL #1   Title Patient demonstrates understanding of updated HEP (Target Date: 05/26/2015)   Baseline MET 05/23/2015   Time 4   Period Weeks   Status Achieved   PT SHORT TERM GOAL #2   Title Patient ambulates with prostheses only with head turns to scan environment maintaining path.  (Target Date: 05/26/2015)   Baseline MET 05/23/2015  Time 4   Period Weeks   Status Achieved   PT SHORT TERM GOAL #3   Title Patient able to reach 10" anteriorly and pick up object from floor safely without UE support.  (Target Date: 05/26/2015)   Baseline MET 05/23/2015   Time 4   Period Weeks   Status Achieved   PT SHORT TERM GOAL #4   Title Patient negotiates stairs with single rail safely with supervision.  (Target Date: 05/26/2015)   Baseline MET 05/23/2015   Time 4   Period Weeks   Status Achieved   PT SHORT TERM GOAL #5   Title Berg Balance >/= 42/56 (Target Date 06/23/2015)   Baseline MET 06/20/2015 Berg Balance 45/56   Time 4   Period Weeks   Status Achieved   PT SHORT TERM GOAL #6   Title Patient ambulates 500' outside surfaces including negotiating ramps & curbs with prostheses only with supervision. (Target Date 06/23/2015)   Baseline MET 06/19/2014    Time 4   Period Weeks   Status Achieved   PT SHORT TERM GOAL #7   Title Patient able to increase gait velocity at fast pace to >4.5 ft/sec to enable to safely cross a street. (Target Date 06/23/2015)   Baseline MET 06/20/2015 Fast gait velocity 4.51 ft/sec   Status Achieved           PT Long Term Goals - 06/20/15 1705    PT LONG TERM GOAL #1   Title Patient verbalizes & demonstrates understanding of ongoing fitness plan / HEP.  (Target Date: 07/21/2015)   Time 12   Period Weeks   Status On-going   PT LONG TERM GOAL #2   Title Berg Balance >45/56 to reduce  risk of falls with standing activities. (Target Date: 07/21/2015)   Time 12   Period Weeks   Status On-going   PT LONG TERM GOAL #3   Title Functional Gait Assessment >20/30 to reduce fall risk with gait activities. (Target Date: 07/21/2015)   Time 12   Period Weeks   Status On-going   PT LONG TERM GOAL #4   Title Patient negotiates ramps & curbs without device and stairs with single rail only with prostheses modified independent. (Target Date: 07/21/2015)   Time 12   Period Weeks   Status On-going               Plan - 06/27/15 1705    Clinical Impression Statement Patient was able to modify balance & gait with 1/2" height change with PT instruction & repetition.    Pt will benefit from skilled therapeutic intervention in order to improve on the following deficits Abnormal gait;Decreased balance;Decreased endurance;Decreased mobility;Decreased strength;Prosthetic Dependency;Increased fascial restricitons   Rehab Potential Good   PT Frequency Other (comment)  After eval, 2x/wk for 3 weeks, then 1x/wk for 9 weeks (06/27/15 switched to 1x/wk every 2-3 weeks to enable ht changes followed by instruction)   PT Duration 12 weeks   PT Treatment/Interventions ADLs/Self Care Home Management;Gait training;Stair training;Functional mobility training;Therapeutic activities;Therapeutic exercise;Balance training;Neuromuscular re-education;Patient/family education;Prosthetic Training   PT Next Visit Plan Increase ht 1/2" every 2-3 weeks to return to prior ht, Work towards LTGs, prosthetic gait including barriers, strengthening and balance activities. frequency now 1x/wk every 2-3 weeks   Consulted and Agree with Plan of Care Patient        Problem List Patient Active Problem List   Diagnosis Date Noted  . Abnormal cardiovascular stress test 12/29/2013  . Awaiting organ transplant 12/09/2013  .  Leg ulcer ([place]) 11/17/2013  . Chronic kidney disease (CKD), stage V ([place]) 11/17/2013  . Diabetes ([place])  11/17/2013  . Cancer of duodenum ([place]) 11/17/2013  . Dyslipidemia 11/17/2013  . Chronic kidney disease requiring chronic dialysis ([place]) 11/17/2013  . Hypercholesteremia 11/17/2013  . H/O malignant neoplasm 11/17/2013  . H/O: HTN (hypertension) 11/17/2013  . BP (high blood pressure) 11/17/2013  . Hypotension 11/17/2013  . Angiopathy, peripheral ([place]) 11/17/2013  . AF (paroxysmal atrial fibrillation) ([place]) 11/17/2013  . BKA stump complication ([place]) 25/42/7062  . S/P bilateral BKA (below knee amputation) ([place]) 08/03/2013  . S/P BKA (below knee amputation) bilateral ([place]) 07/30/2013  . Open wound of both legs with complication 37/62/8315  . Acute blood loss anemia 07/12/2013  . Syncope 07/09/2013  . Intolerance to CAPD peritoneal dialysis s/p CAPD cath removal 07/09/2013 07/05/2013  . Critical lower limb ischemia 06/30/2013  . Extremity pain 06/30/2013  . Atherosclerosis of native arteries of the extremities with ulceration(440.23) 06/17/2013  . Gait disorder 05/31/2013  . Weakness 05/24/2013  . Depression 04/03/2013  . GERD (gastroesophageal reflux disease) 04/03/2013  . Atrial fibrillation ([place]) 04/02/2013  . DM (diabetes mellitus) ([place]) 12/28/2012  . Other complications due to renal dialysis device, implant, and graft 05/19/2012  . Diabetes mellitus, type 2 ([place]) 11/13/2011  . Anemia 07/02/2011  . ESRD on hemodialysis ([place]) 05/29/2011    WALDRON,ROBIN PT, DPT 06/28/2015, 2:46 PM  [facility] [address] [place], [place], [address] Phone: [phone]   Fax:  [phone]  Name: Paul [name] MRN: [id number] Date of Birth: 09-20-1955

## 2015-07-04 ENCOUNTER — Encounter: Payer: Managed Care, Other (non HMO) | Admitting: Physical Therapy

## 2015-07-11 ENCOUNTER — Encounter: Payer: Self-pay | Admitting: Physical Therapy

## 2015-07-11 ENCOUNTER — Ambulatory Visit: Payer: Managed Care, Other (non HMO) | Admitting: Physical Therapy

## 2015-07-11 DIAGNOSIS — Z89512 Acquired absence of left leg below knee: Secondary | ICD-10-CM

## 2015-07-11 DIAGNOSIS — R269 Unspecified abnormalities of gait and mobility: Secondary | ICD-10-CM

## 2015-07-11 DIAGNOSIS — Z940 Kidney transplant status: Secondary | ICD-10-CM

## 2015-07-11 DIAGNOSIS — R2689 Other abnormalities of gait and mobility: Secondary | ICD-10-CM

## 2015-07-11 DIAGNOSIS — R531 Weakness: Secondary | ICD-10-CM

## 2015-07-11 DIAGNOSIS — Z4789 Encounter for other orthopedic aftercare: Secondary | ICD-10-CM

## 2015-07-11 DIAGNOSIS — Z7409 Other reduced mobility: Secondary | ICD-10-CM

## 2015-07-11 DIAGNOSIS — Z89511 Acquired absence of right leg below knee: Secondary | ICD-10-CM

## 2015-07-11 DIAGNOSIS — R2681 Unsteadiness on feet: Secondary | ICD-10-CM

## 2015-07-11 DIAGNOSIS — R2991 Unspecified symptoms and signs involving the musculoskeletal system: Secondary | ICD-10-CM

## 2015-07-12 NOTE — Therapy (Signed)
[facility] [address] [place], [place], [address] Phone: [phone]   Fax:  [phone]  Physical Therapy Treatment  Patient Details  Name: Paul [name] MRN: [id number] Date of Birth: Dec 17, 1955 Referring Provider: [name], MD  Encounter Date: 07/11/2015      PT End of Session - 07/11/15 1715    Visit Number 14   Number of Visits 16   Authorization Type [employer] -    Authorization Time Period Frequency switched to 1/wk every 2-3 weeks to enable ht changes with PT treatment following   PT Start Time 1615   PT Stop Time 1705   PT Time Calculation (min) 50 min   Equipment Utilized During Treatment Gait belt   Activity Tolerance Patient tolerated treatment well   Behavior During Therapy [facility] for tasks assessed/performed      Past Medical History  Diagnosis Date  . Open wound of both legs with complication     Pt has had progressive wounds of both LE's including gangrene of the toes and patchy necrosis of the calves.  He has been treated at [facility] by Dr Jerline Pain with hyperbaric O2 5d per week.  He is getting Na Thiosulfate with HD for suspected calciphylaxis. Pt says doctor's aren't sure if these ulcers were diabetic ulcers or calciphylaxis.  He underwent bilat BKA on 07/30/13.  He says that the woun  . ESRD on hemodialysis ([place])     Pt has ESRD due to DM.  He had a L upper arm AVF prior to starting HD which was ligated due to L arm steal syndrome.  He started HD in Jan 2014 and did home HD.  The family couldn't cannulate the R arm AVF successfully so by mid 2014 they decided to switch to PD which was done late summer 2014.  In Jan 2015 he was admitted with FTT felt to be due to underdialysis on PD and PD was abandoned and he   . Anemia     esrd  . Hypertension     off of meds due to orthostatic hypotension  . Closed left arm fracture 1967  . Heart murmur   . Corneal abrasion, left   . GERD (gastroesophageal  reflux disease)   . History of blood transfusion   . Glomerulosclerosis, diabetic ([place])   . Depression   . A-fib ([place])   . Calciphylaxis 05/2013    lower ext  . Diabetes mellitus     Type 1 iddm x 11 yrs  . Diabetic neuropathy ([place])   . Cancer ([place])     hx duodenal adenoCa 2002, resected    Past Surgical History  Procedure Laterality Date  . Whipple procedure  2002    duodenal ca, Dr. Harlow Asa  . Bone marrow biopsy  2012  . Renal biopsy, percutaneous  2012  . Tonsillectomy    . Portacath placement  2002  . Biliary diversion, external  2002  . Other surgical history      Cyst removed from back  . Av fistula placement  06/07/2011    Procedure: ARTERIOVENOUS (AV) FISTULA CREATION;  Surgeon: Angelia Mould, MD;  Location: [facility];  Service: Vascular;  Laterality: Left;  . Ligation of arteriovenous  fistula  05/22/2012    Procedure: LIGATION OF ARTERIOVENOUS  FISTULA;  Surgeon: Mal Misty, MD;  Location: [facility];  Service: Vascular;  Laterality: Left;  Left brachio-cephalic fistula  . Insertion of dialysis catheter  05/22/2012  Procedure: INSERTION OF DIALYSIS CATHETER;  Surgeon: Mal Misty, MD;  Location: [facility];  Service: Vascular;  Laterality: N/A;  right internal jugular vein  . Av fistula placement  06/18/2012    Procedure: ARTERIOVENOUS (AV) FISTULA CREATION;  Surgeon: Mal Misty, MD;  Location: [facility];  Service: Vascular;  Laterality: Right;  [place]  . Colonoscopy      Hx: of  . Capd insertion N/A 01/14/2013    Procedure: LAPAROSCOPIC INSERTION CONTINUOUS AMBULATORY PERITONEAL DIALYSIS  (CAPD) CATHETER;  Surgeon: Adin Hector, MD;  Location: [facility];  Service: General;  Laterality: N/A;  . Vascular surgery    . Removal of a dialysis catheter    . Capd removal N/A 07/09/2013    Procedure: CONTINUOUS AMBULATORY PERITONEAL DIALYSIS  (CAPD) CATHETER REMOVAL;  Surgeon: Adin Hector, MD;  Location: [facility];  Service: General;  Laterality: N/A;  . Amputation Bilateral  07/30/2013    Procedure: AMPUTATION BELOW KNEE;  Surgeon: Newt Minion, MD;  Location: [facility];  Service: Orthopedics;  Laterality: Bilateral;  Bilateral Below Knee Amputations  . Amputation Bilateral 09/03/2013    Procedure: AMPUTATION BELOW KNEE;  Surgeon: Newt Minion, MD;  Location: [facility];  Service: Orthopedics;  Laterality: Bilateral;  Bilateral Below Knee Amputation Revision  . Lower extremity angiogram N/A 06/14/2013    Procedure: LOWER EXTREMITY ANGIOGRAM;  Surgeon: Angelia Mould, MD;  Location: [facility] CATH LAB;  Service: Cardiovascular;  Laterality: N/A;  . Nephrectomy transplanted organ      There were no vitals filed for this visit.  Visit Diagnosis:  Abnormality of gait  Impaired functional mobility and activity tolerance  Weakness generalized  Unsteadiness  Balance problems  Encounter for prosthetic gait training  S/P bilateral BKA (below knee amputation) ([place])  Kidney transplant recipient  Impaired transfers      Subjective Assessment - 07/11/15 1623    Subjective Paul [name] increased height almost an inch this time and his balance is off more this time.    Patient is accompained by: Family member   Currently in Pain? No/denies        Prosthetic Training with bilateral Transtibial Amputation prostheses (06/22/15 ht increased 1/2"& ~1" on 07/06/15 & 07/07/15 per pt request to return to prior ht which is total of 2") Patient arrived using his single point cane again due to instability. Patient's current height is 5'11.5" Sit to/from stand with light UE assist and stabilizing without UE support. Turning 180* while ambulating with supervision / cues on wt shift. Backwards & side stepping with supervision.  Stairs with 2 rails reciprocal with cues on step length & technique. Ramps without device with supervision / cues on posture & wt shift. Curbs with close supervision due to instability with cues on technique including step length, changing which LE is lead  limb. Patient ambulated 200' including grass & grass slope without device with supervision & cues on technique. Floor transfer pushing on mat table with UEs required minA with each limb alternating.                                     PT Education - 07/11/15 1715    Education provided Yes   Education Details Recommendation to not increase height further until balance is at level prior to recent increase   Person(s) Educated Patient;Spouse   Methods Explanation   Comprehension Verbalized understanding  PT Short Term Goals - 06/20/15 1705    PT SHORT TERM GOAL #1   Title Patient demonstrates understanding of updated HEP (Target Date: 05/26/2015)   Baseline MET 05/23/2015   Time 4   Period Weeks   Status Achieved   PT SHORT TERM GOAL #2   Title Patient ambulates with prostheses only with head turns to scan environment maintaining path.  (Target Date: 05/26/2015)   Baseline MET 05/23/2015   Time 4   Period Weeks   Status Achieved   PT SHORT TERM GOAL #3   Title Patient able to reach 10" anteriorly and pick up object from floor safely without UE support.  (Target Date: 05/26/2015)   Baseline MET 05/23/2015   Time 4   Period Weeks   Status Achieved   PT SHORT TERM GOAL #4   Title Patient negotiates stairs with single rail safely with supervision.  (Target Date: 05/26/2015)   Baseline MET 05/23/2015   Time 4   Period Weeks   Status Achieved   PT SHORT TERM GOAL #5   Title Berg Balance >/= 42/56 (Target Date 06/23/2015)   Baseline MET 06/20/2015 Berg Balance 45/56   Time 4   Period Weeks   Status Achieved   PT SHORT TERM GOAL #6   Title Patient ambulates 500' outside surfaces including negotiating ramps & curbs with prostheses only with supervision. (Target Date 06/23/2015)   Baseline MET 06/19/2014    Time 4   Period Weeks   Status Achieved   PT SHORT TERM GOAL #7   Title Patient able to increase gait velocity at fast pace to >4.5 ft/sec to enable to  safely cross a street. (Target Date 06/23/2015)   Baseline MET 06/20/2015 Fast gait velocity 4.51 ft/sec   Status Achieved           PT Long Term Goals - 06/20/15 1705    PT LONG TERM GOAL #1   Title Patient verbalizes & demonstrates understanding of ongoing fitness plan / HEP.  (Target Date: 07/21/2015)   Time 12   Period Weeks   Status On-going   PT LONG TERM GOAL #2   Title Berg Balance >45/56 to reduce risk of falls with standing activities. (Target Date: 07/21/2015)   Time 12   Period Weeks   Status On-going   PT LONG TERM GOAL #3   Title Functional Gait Assessment >20/30 to reduce fall risk with gait activities. (Target Date: 07/21/2015)   Time 12   Period Weeks   Status On-going   PT LONG TERM GOAL #4   Title Patient negotiates ramps & curbs without device and stairs with single rail only with prostheses modified independent. (Target Date: 07/21/2015)   Time 12   Period Weeks   Status On-going               Plan - 07/11/15 1715    Clinical Impression Statement The most recent increase to height has decreased his balance and functional strength. He should be able to improve to prior level with exercises & function which he seems to understand.    Pt will benefit from skilled therapeutic intervention in order to improve on the following deficits Abnormal gait;Decreased balance;Decreased endurance;Decreased mobility;Decreased strength;Prosthetic Dependency;Increased fascial restricitons   Rehab Potential Good   PT Frequency Other (comment)  After eval, 2x/wk for 3 weeks, then 1x/wk for 9 weeks (06/27/15 switched to 1x/wk every 2-3 weeks to enable ht changes followed by instruction)   PT Duration 12 weeks  PT Treatment/Interventions ADLs/Self Care Home Management;Gait training;Stair training;Functional mobility training;Therapeutic activities;Therapeutic exercise;Balance training;Neuromuscular re-education;Patient/family education;Prosthetic Training   PT Next Visit Plan  Increase ht 1/2" every 2-3 weeks to return to prior ht, Work towards LTGs, prosthetic gait including barriers, strengthening and balance activities. frequency now 1x/wk every 2-3 weeks   Consulted and Agree with Plan of Care Patient;Family member/caregiver   Family Member Consulted wife, [name]        Problem List Patient Active Problem List   Diagnosis Date Noted  . Abnormal cardiovascular stress test 12/29/2013  . Awaiting organ transplant 12/09/2013  . Leg ulcer ([place]) 11/17/2013  . Chronic kidney disease (CKD), stage V ([place]) 11/17/2013  . Diabetes ([place]) 11/17/2013  . Cancer of duodenum ([place]) 11/17/2013  . Dyslipidemia 11/17/2013  . Chronic kidney disease requiring chronic dialysis ([place]) 11/17/2013  . Hypercholesteremia 11/17/2013  . H/O malignant neoplasm 11/17/2013  . H/O: HTN (hypertension) 11/17/2013  . BP (high blood pressure) 11/17/2013  . Hypotension 11/17/2013  . Angiopathy, peripheral ([place]) 11/17/2013  . AF (paroxysmal atrial fibrillation) ([place]) 11/17/2013  . BKA stump complication ([place]) 45/07/8880  . S/P bilateral BKA (below knee amputation) ([place]) 08/03/2013  . S/P BKA (below knee amputation) bilateral ([place]) 07/30/2013  . Open wound of both legs with complication 80/07/4915  . Acute blood loss anemia 07/12/2013  . Syncope 07/09/2013  . Intolerance to CAPD peritoneal dialysis s/p CAPD cath removal 07/09/2013 07/05/2013  . Critical lower limb ischemia 06/30/2013  . Extremity pain 06/30/2013  . Atherosclerosis of native arteries of the extremities with ulceration(440.23) 06/17/2013  . Gait disorder 05/31/2013  . Weakness 05/24/2013  . Depression 04/03/2013  . GERD (gastroesophageal reflux disease) 04/03/2013  . Atrial fibrillation ([place]) 04/02/2013  . DM (diabetes mellitus) ([place]) 12/28/2012  . Other complications due to renal dialysis device, implant, and graft 05/19/2012  . Diabetes mellitus, type 2 ([place]) 11/13/2011  . Anemia 07/02/2011  . ESRD on hemodialysis ([place])  05/29/2011    Samora Jernberg PT, DPT 07/12/2015, 10:19 PM  [facility] [address] [place], [place], [address] Phone: [phone]   Fax:  [phone]  Name: MILDRED BOLLARD MRN: [id number] Date of Birth: Jul 05, 1955

## 2015-07-18 ENCOUNTER — Encounter: Payer: Managed Care, Other (non HMO) | Admitting: Physical Therapy

## 2015-07-31 ENCOUNTER — Ambulatory Visit: Payer: Managed Care, Other (non HMO) | Attending: Family Medicine | Admitting: Physical Therapy

## 2015-07-31 ENCOUNTER — Encounter: Payer: Self-pay | Admitting: Physical Therapy

## 2015-07-31 DIAGNOSIS — R531 Weakness: Secondary | ICD-10-CM

## 2015-07-31 DIAGNOSIS — Z940 Kidney transplant status: Secondary | ICD-10-CM | POA: Diagnosis present

## 2015-07-31 DIAGNOSIS — Z4789 Encounter for other orthopedic aftercare: Secondary | ICD-10-CM

## 2015-07-31 DIAGNOSIS — Z7409 Other reduced mobility: Secondary | ICD-10-CM

## 2015-07-31 DIAGNOSIS — R29818 Other symptoms and signs involving the nervous system: Secondary | ICD-10-CM | POA: Insufficient documentation

## 2015-07-31 DIAGNOSIS — Z5189 Encounter for other specified aftercare: Secondary | ICD-10-CM | POA: Insufficient documentation

## 2015-07-31 DIAGNOSIS — Z89512 Acquired absence of left leg below knee: Secondary | ICD-10-CM | POA: Diagnosis present

## 2015-07-31 DIAGNOSIS — R269 Unspecified abnormalities of gait and mobility: Secondary | ICD-10-CM

## 2015-07-31 DIAGNOSIS — Z89511 Acquired absence of right leg below knee: Secondary | ICD-10-CM

## 2015-07-31 DIAGNOSIS — R2991 Unspecified symptoms and signs involving the musculoskeletal system: Secondary | ICD-10-CM | POA: Diagnosis present

## 2015-07-31 DIAGNOSIS — R2681 Unsteadiness on feet: Secondary | ICD-10-CM

## 2015-07-31 DIAGNOSIS — R2689 Other abnormalities of gait and mobility: Secondary | ICD-10-CM

## 2015-07-31 NOTE — Therapy (Signed)
[facility] [address] [place], [place], [address] Phone: [phone]   Fax:  [phone]  Physical Therapy Treatment  Patient Details  Name: Paul [name] MRN: [id number] Date of Birth: [birth year] Referring Provider: [name], MD  Encounter Date: 07/31/2015      PT End of Session - 07/31/15 1715    Visit Number 15   Number of Visits 16   Authorization Type [employer] -    Authorization Time Period Frequency switched to 1/wk every 2-3 weeks to enable ht changes with PT treatment following   PT Start Time 1620   PT Stop Time 1705   PT Time Calculation (min) 45 min   Equipment Utilized During Treatment Gait belt   Activity Tolerance Patient tolerated treatment well   Behavior During Therapy [facility] for tasks assessed/performed      Past Medical History  Diagnosis Date  . Open wound of both legs with complication     Pt has had progressive wounds of both LE's including gangrene of the toes and patchy necrosis of the calves.  He has been treated at [facility] by Dr Jerline Pain with hyperbaric O2 5d per week.  He is getting Na Thiosulfate with HD for suspected calciphylaxis. Pt says doctor's aren't sure if these ulcers were diabetic ulcers or calciphylaxis.  He underwent bilat BKA on 07/30/13.  He says that the woun  . ESRD on hemodialysis ([place])     Pt has ESRD due to DM.  He had a L upper arm AVF prior to starting HD which was ligated due to L arm steal syndrome.  He started HD in Jan 2014 and did home HD.  The family couldn't cannulate the R arm AVF successfully so by mid 2014 they decided to switch to PD which was done late summer 2014.  In Jan 2015 he was admitted with FTT felt to be due to underdialysis on PD and PD was abandoned and he   . Anemia     esrd  . Hypertension     off of meds due to orthostatic hypotension  . Closed left arm fracture 1967  . Heart murmur   . Corneal abrasion, left   . GERD (gastroesophageal  reflux disease)   . History of blood transfusion   . Glomerulosclerosis, diabetic ([place])   . Depression   . A-fib ([place])   . Calciphylaxis 05/2013    lower ext  . Diabetes mellitus     Type 1 iddm x 11 yrs  . Diabetic neuropathy ([place])   . Cancer ([place])     hx duodenal adenoCa 2002, resected    Past Surgical History  Procedure Laterality Date  . Whipple procedure  2002    duodenal ca, Dr. Harlow Asa  . Bone marrow biopsy  2012  . Renal biopsy, percutaneous  2012  . Tonsillectomy    . Portacath placement  2002  . Biliary diversion, external  2002  . Other surgical history      Cyst removed from back  . Av fistula placement  06/07/2011    Procedure: ARTERIOVENOUS (AV) FISTULA CREATION;  Surgeon: Angelia Mould, MD;  Location: [facility];  Service: Vascular;  Laterality: Left;  . Ligation of arteriovenous  fistula  05/22/2012    Procedure: LIGATION OF ARTERIOVENOUS  FISTULA;  Surgeon: Mal Misty, MD;  Location: [facility];  Service: Vascular;  Laterality: Left;  Left brachio-cephalic fistula  . Insertion of dialysis catheter  05/22/2012  Procedure: INSERTION OF DIALYSIS CATHETER;  Surgeon: Pryor Ochoa, MD;  Location: [facility] OR;  Service: Vascular;  Laterality: N/A;  right internal jugular vein  . Av fistula placement  06/18/2012    Procedure: ARTERIOVENOUS (AV) FISTULA CREATION;  Surgeon: Pryor Ochoa, MD;  Location: [facility] OR;  Service: Vascular;  Laterality: Right;  CIMINO  . Colonoscopy      Hx: of  . Capd insertion N/A 01/14/2013    Procedure: LAPAROSCOPIC INSERTION CONTINUOUS AMBULATORY PERITONEAL DIALYSIS  (CAPD) CATHETER;  Surgeon: Ardeth Sportsman, MD;  Location: [facility] OR;  Service: General;  Laterality: N/A;  . Vascular surgery    . Removal of a dialysis catheter    . Capd removal N/A 07/09/2013    Procedure: CONTINUOUS AMBULATORY PERITONEAL DIALYSIS  (CAPD) CATHETER REMOVAL;  Surgeon: Ardeth Sportsman, MD;  Location: [facility] OR;  Service: General;  Laterality: N/A;  . Amputation Bilateral  07/30/2013    Procedure: AMPUTATION BELOW KNEE;  Surgeon: Nadara Mustard, MD;  Location: [facility] OR;  Service: Orthopedics;  Laterality: Bilateral;  Bilateral Below Knee Amputations  . Amputation Bilateral 09/03/2013    Procedure: AMPUTATION BELOW KNEE;  Surgeon: Nadara Mustard, MD;  Location: [facility] OR;  Service: Orthopedics;  Laterality: Bilateral;  Bilateral Below Knee Amputation Revision  . Lower extremity angiogram N/A 06/14/2013    Procedure: LOWER EXTREMITY ANGIOGRAM;  Surgeon: Chuck Hint, MD;  Location: [facility] CATH LAB;  Service: Cardiovascular;  Laterality: N/A;  . Nephrectomy transplanted organ      There were no vitals filed for this visit.  Visit Diagnosis:  Abnormality of gait  Impaired functional mobility and activity tolerance  Unsteadiness  Weakness generalized  Balance problems  Encounter for prosthetic gait training  S/P bilateral BKA (below knee amputation) (HCC)  Kidney transplant recipient  Impaired transfers      Subjective Assessment - 07/31/15 1623    Subjective Same height as 3 weeks ago. He is more stable but not to level prior to last increase. No falls.    Patient is accompained by: Family member   Patient Stated Goals To walk up/down stairs with 1 rail and no device, negotiate curbs better.  stand & balance on one foot, Walk without waddling   Currently in Pain? No/denies      Prosthetic Training with bilateral Transtibial Amputation prostheses:  Left liner still has hole which is causing decrease in suspension and have to redonne multiple times per day. Patient is scheduled to see prosthetist in 2 days on 08/02/2015 for new sockets, pylon rotator / shock absorption and adjusting height to length of his suit pants. Pt verbalized understanding. Patient ambulated 500' without device with direction changes, scanning environment and increasing speed. Patient negotiated stairs with 2 rails reciprocal pattern with cues on weight shift.  Patient negotiated  ramps multiple reps without device with cues on technique. Patient negotiated curb multiple reps without device with cues on technique including posture. Floor transfers pushing on mat table with verbal cues only on technique.                             PT Short Term Goals - 06/20/15 1705    PT SHORT TERM GOAL #1   Title Patient demonstrates understanding of updated HEP (Target Date: 05/26/2015)   Baseline MET 05/23/2015   Time 4   Period Weeks   Status Achieved   PT SHORT TERM GOAL #2   Title Patient  ambulates with prostheses only with head turns to scan environment maintaining path.  (Target Date: 05/26/2015)   Baseline MET 05/23/2015   Time 4   Period Weeks   Status Achieved   PT SHORT TERM GOAL #3   Title Patient able to reach 10" anteriorly and pick up object from floor safely without UE support.  (Target Date: 05/26/2015)   Baseline MET 05/23/2015   Time 4   Period Weeks   Status Achieved   PT SHORT TERM GOAL #4   Title Patient negotiates stairs with single rail safely with supervision.  (Target Date: 05/26/2015)   Baseline MET 05/23/2015   Time 4   Period Weeks   Status Achieved   PT SHORT TERM GOAL #5   Title Berg Balance >/= 42/56 (Target Date 06/23/2015)   Baseline MET 06/20/2015 Berg Balance 45/56   Time 4   Period Weeks   Status Achieved   PT SHORT TERM GOAL #6   Title Patient ambulates 500' outside surfaces including negotiating ramps & curbs with prostheses only with supervision. (Target Date 06/23/2015)   Baseline MET 06/19/2014    Time 4   Period Weeks   Status Achieved   PT SHORT TERM GOAL #7   Title Patient able to increase gait velocity at fast pace to >4.5 ft/sec to enable to safely cross a street. (Target Date 06/23/2015)   Baseline MET 06/20/2015 Fast gait velocity 4.51 ft/sec   Status Achieved           PT Long Term Goals - 06/20/15 1705    PT LONG TERM GOAL #1   Title Patient verbalizes & demonstrates understanding of ongoing fitness plan  / HEP.  (Target Date: 07/21/2015)   Time 12   Period Weeks   Status On-going   PT LONG TERM GOAL #2   Title Berg Balance >45/56 to reduce risk of falls with standing activities. (Target Date: 07/21/2015)   Time 12   Period Weeks   Status On-going   PT LONG TERM GOAL #3   Title Functional Gait Assessment >20/30 to reduce fall risk with gait activities. (Target Date: 07/21/2015)   Time 12   Period Weeks   Status On-going   PT LONG TERM GOAL #4   Title Patient negotiates ramps & curbs without device and stairs with single rail only with prostheses modified independent. (Target Date: 07/21/2015)   Time 12   Period Weeks   Status On-going               Plan - 07/31/15 1715    Clinical Impression Statement Patient improved ability to negotiate stairs, ramps & curbs without device with instruction in posture, technique & weight shift. Patient is tolerating increased height with improved balance.    Pt will benefit from skilled therapeutic intervention in order to improve on the following deficits Abnormal gait;Decreased balance;Decreased endurance;Decreased mobility;Decreased strength;Prosthetic Dependency;Increased fascial restricitons   Rehab Potential Good   PT Frequency Other (comment)  After eval, 2x/wk for 3 weeks, then 1x/wk for 9 weeks (06/27/15 switched to 1x/wk every 2-3 weeks to enable ht changes followed by instruction)   PT Duration 12 weeks   PT Treatment/Interventions ADLs/Self Care Home Management;Gait training;Stair training;Functional mobility training;Therapeutic activities;Therapeutic exercise;Balance training;Neuromuscular re-education;Patient/family education;Prosthetic Training   PT Next Visit Plan assess LTGs & discharge   Consulted and Agree with Plan of Care Patient;Family member/caregiver   Family Member Consulted wife, [name]        Problem List Patient Active Problem List  Diagnosis Date Noted  . Abnormal cardiovascular stress test 12/29/2013  . Awaiting  organ transplant 12/09/2013  . Leg ulcer ([place]) 11/17/2013  . Chronic kidney disease (CKD), stage V ([place]) 11/17/2013  . Diabetes ([place]) 11/17/2013  . Cancer of duodenum ([place]) 11/17/2013  . Dyslipidemia 11/17/2013  . Chronic kidney disease requiring chronic dialysis ([place]) 11/17/2013  . Hypercholesteremia 11/17/2013  . H/O malignant neoplasm 11/17/2013  . H/O: HTN (hypertension) 11/17/2013  . BP (high blood pressure) 11/17/2013  . Hypotension 11/17/2013  . Angiopathy, peripheral ([place]) 11/17/2013  . AF (paroxysmal atrial fibrillation) ([place]) 11/17/2013  . BKA stump complication ([place]) 76/19/5093  . S/P bilateral BKA (below knee amputation) ([place]) 08/03/2013  . S/P BKA (below knee amputation) bilateral ([place]) 07/30/2013  . Open wound of both legs with complication 26/71/2458  . Acute blood loss anemia 07/12/2013  . Syncope 07/09/2013  . Intolerance to CAPD peritoneal dialysis s/p CAPD cath removal 07/09/2013 07/05/2013  . Critical lower limb ischemia 06/30/2013  . Extremity pain 06/30/2013  . Atherosclerosis of native arteries of the extremities with ulceration(440.23) 06/17/2013  . Gait disorder 05/31/2013  . Weakness 05/24/2013  . Depression 04/03/2013  . GERD (gastroesophageal reflux disease) 04/03/2013  . Atrial fibrillation ([place]) 04/02/2013  . DM (diabetes mellitus) ([place]) 12/28/2012  . Other complications due to renal dialysis device, implant, and graft 05/19/2012  . Diabetes mellitus, type 2 ([place]) 11/13/2011  . Anemia 07/02/2011  . ESRD on hemodialysis ([place]) 05/29/2011    Arsh Feutz PT, DPT 07/31/2015, 9:54 PM  [facility] [address] [place], [place], [address] Phone: [phone]   Fax:  [phone]  Name: JAMMY PLOTKIN MRN: [id number] Date of Birth: 08-Nov-1955

## 2015-08-14 ENCOUNTER — Encounter: Payer: Managed Care, Other (non HMO) | Admitting: Physical Therapy

## 2015-08-24 ENCOUNTER — Ambulatory Visit: Payer: Managed Care, Other (non HMO) | Attending: Family Medicine | Admitting: Physical Therapy

## 2015-08-24 DIAGNOSIS — R2681 Unsteadiness on feet: Secondary | ICD-10-CM | POA: Diagnosis present

## 2015-08-24 DIAGNOSIS — M6281 Muscle weakness (generalized): Secondary | ICD-10-CM | POA: Insufficient documentation

## 2015-08-24 DIAGNOSIS — R2689 Other abnormalities of gait and mobility: Secondary | ICD-10-CM | POA: Insufficient documentation

## 2015-08-25 NOTE — Therapy (Signed)
[facility] [address] [place], [place], [address] Phone: [phone]   Fax:  [phone]  Physical Therapy Treatment  Patient Details  Name: Paul [name] MRN: [id number] Date of Birth: October 24, 1955 Referring Provider: [name], MD  Encounter Date: 08/24/2015      PT End of Session - 08/24/15 1630    Visit Number 16   Number of Visits 16   Authorization Type [employer] -    Authorization Time Period Frequency switched to 1/wk every 2-3 weeks to enable ht changes with PT treatment following   PT Start Time 1537   PT Stop Time 1620   PT Time Calculation (min) 43 min   Equipment Utilized During Treatment Gait belt   Activity Tolerance Patient tolerated treatment well   Behavior During Therapy [facility] for tasks assessed/performed      Past Medical History  Diagnosis Date  . Open wound of both legs with complication     Pt has had progressive wounds of both LE's including gangrene of the toes and patchy necrosis of the calves.  He has been treated at [facility] by Dr Jerline Pain with hyperbaric O2 5d per week.  He is getting Na Thiosulfate with HD for suspected calciphylaxis. Pt says doctor's aren't sure if these ulcers were diabetic ulcers or calciphylaxis.  He underwent bilat BKA on 07/30/13.  He says that the woun  . ESRD on hemodialysis ([facility])     Pt has ESRD due to DM.  He had a L upper arm AVF prior to starting HD which was ligated due to L arm steal syndrome.  He started HD in Jan 2014 and did home HD.  The family couldn't cannulate the R arm AVF successfully so by mid 2014 they decided to switch to PD which was done late summer 2014.  In Jan 2015 he was admitted with FTT felt to be due to underdialysis on PD and PD was abandoned and he   . Anemia     esrd  . Hypertension     off of meds due to orthostatic hypotension  . Closed left arm fracture 1967  . Heart murmur   . Corneal abrasion, left   . GERD (gastroesophageal  reflux disease)   . History of blood transfusion   . Glomerulosclerosis, diabetic ([place])   . Depression   . A-fib ([place])   . Calciphylaxis 05/2013    lower ext  . Diabetes mellitus     Type 1 iddm x 11 yrs  . Diabetic neuropathy ([place])   . Cancer ([place])     hx duodenal adenoCa 2002, resected    Past Surgical History  Procedure Laterality Date  . Whipple procedure  2002    duodenal ca, Dr. Harlow Asa  . Bone marrow biopsy  2012  . Renal biopsy, percutaneous  2012  . Tonsillectomy    . Portacath placement  2002  . Biliary diversion, external  2002  . Other surgical history      Cyst removed from back  . Av fistula placement  06/07/2011    Procedure: ARTERIOVENOUS (AV) FISTULA CREATION;  Surgeon: Angelia Mould, MD;  Location: [facility];  Service: Vascular;  Laterality: Left;  . Ligation of arteriovenous  fistula  05/22/2012    Procedure: LIGATION OF ARTERIOVENOUS  FISTULA;  Surgeon: Mal Misty, MD;  Location: [facility];  Service: Vascular;  Laterality: Left;  Left brachio-cephalic fistula  . Insertion of dialysis catheter  05/22/2012  Procedure: INSERTION OF DIALYSIS CATHETER;  Surgeon: Mal Misty, MD;  Location: [facility];  Service: Vascular;  Laterality: N/A;  right internal jugular vein  . Av fistula placement  06/18/2012    Procedure: ARTERIOVENOUS (AV) FISTULA CREATION;  Surgeon: Mal Misty, MD;  Location: [facility];  Service: Vascular;  Laterality: Right;  [place]  . Colonoscopy      Hx: of  . Capd insertion N/A 01/14/2013    Procedure: LAPAROSCOPIC INSERTION CONTINUOUS AMBULATORY PERITONEAL DIALYSIS  (CAPD) CATHETER;  Surgeon: Adin Hector, MD;  Location: [facility];  Service: General;  Laterality: N/A;  . Vascular surgery    . Removal of a dialysis catheter    . Capd removal N/A 07/09/2013    Procedure: CONTINUOUS AMBULATORY PERITONEAL DIALYSIS  (CAPD) CATHETER REMOVAL;  Surgeon: Adin Hector, MD;  Location: [facility];  Service: General;  Laterality: N/A;  . Amputation Bilateral  07/30/2013    Procedure: AMPUTATION BELOW KNEE;  Surgeon: Newt Minion, MD;  Location: [facility];  Service: Orthopedics;  Laterality: Bilateral;  Bilateral Below Knee Amputations  . Amputation Bilateral 09/03/2013    Procedure: AMPUTATION BELOW KNEE;  Surgeon: Newt Minion, MD;  Location: [facility];  Service: Orthopedics;  Laterality: Bilateral;  Bilateral Below Knee Amputation Revision  . Lower extremity angiogram N/A 06/14/2013    Procedure: LOWER EXTREMITY ANGIOGRAM;  Surgeon: Angelia Mould, MD;  Location: [facility] CATH LAB;  Service: Cardiovascular;  Laterality: N/A;  . Nephrectomy transplanted organ      There were no vitals filed for this visit.      Subjective Assessment - 08/24/15 1540    Subjective On 08/02/2015 he got rotators and height change which is 2" higher than last session. No falls.   Patient is accompained by: Family member   Patient Stated Goals To walk up/down stairs with 1 rail and no device, negotiate curbs better.  stand & balance on one foot, Walk without waddling            [facility] PT Assessment - 08/24/15 1520    Berg Balance Test   Sit to Stand Able to stand without using hands and stabilize independently   Standing Unsupported Able to stand safely 2 minutes   Sitting with Back Unsupported but Feet Supported on Floor or Stool Able to sit safely and securely 2 minutes   Stand to Sit Sits safely with minimal use of hands   Transfers Able to transfer safely, minor use of hands   Standing Unsupported with Eyes Closed Able to stand 10 seconds safely   Standing Ubsupported with Feet Together Able to place feet together independently and stand for 1 minute with supervision   From Standing, Reach Forward with Outstretched Arm Can reach confidently >25 cm (10")   From Standing Position, Pick up Object from Floor Able to pick up shoe safely and easily   From Standing Position, Turn to Look Behind Over each Shoulder Looks behind from both sides and weight shifts well   Turn  360 Degrees Able to turn 360 degrees safely one side only in 4 seconds or less   Standing Unsupported, Alternately Place Feet on Step/Stool Able to complete 4 steps without aid or supervision   Standing Unsupported, One Foot in Front Able to take small step independently and hold 30 seconds   Standing on One Leg Tries to lift leg/unable to hold 3 seconds but remains standing independently   Total Score 47   Functional Gait  Assessment  Gait assessed  Yes   Gait Level Surface Walks 20 ft in less than 5.5 sec, no assistive devices, good speed, no evidence for imbalance, normal gait pattern, deviates no more than 6 in outside of the 12 in walkway width.   Change in Gait Speed Able to smoothly change walking speed without loss of balance or gait deviation. Deviate no more than 6 in outside of the 12 in walkway width.   Gait with Horizontal Head Turns Performs head turns smoothly with no change in gait. Deviates no more than 6 in outside 12 in walkway width   Gait with Vertical Head Turns Performs head turns with no change in gait. Deviates no more than 6 in outside 12 in walkway width.   Gait and Pivot Turn Pivot turns safely within 3 sec and stops quickly with no loss of balance.   Step Over Obstacle Is able to step over one shoe box (4.5 in total height) without changing gait speed. No evidence of imbalance.   Gait with Narrow Base of Support Ambulates less than 4 steps heel to toe or cannot perform without assistance.   Gait with Eyes Closed Walks 20 ft, uses assistive device, slower speed, mild gait deviations, deviates 6-10 in outside 12 in walkway width. Ambulates 20 ft in less than 9 sec but greater than 7 sec.   Ambulating Backwards Walks 20 ft, uses assistive device, slower speed, mild gait deviations, deviates 6-10 in outside 12 in walkway width.   Steps Alternating feet, must use rail.   Total Score 23         Prosthetics Assessment - 08/24/15 1520    Prosthetics   Prosthetic Care  Independent with Skin check;Residual limb care;Prosthetic cleaning;Ply sock cleaning;Correct ply sock adjustment;Proper wear schedule/adjustment;Proper weight-bearing schedule/adjustment                    [facility] Adult PT Treatment/Exercise - 08/24/15 1520    Transfers   Transfers Sit to Stand;Stand to Sit   Sit to Stand 6: Modified independent (Device/Increase time);With upper extremity assist;From chair/3-in-1   Stand to Sit 6: Modified independent (Device/Increase time);With upper extremity assist;To chair/3-in-1   Ambulation/Gait   Ambulation/Gait Yes   Ambulation/Gait Assistance 6: Modified independent (Device/Increase time)   Ambulation Distance (Feet) 700 Feet   Assistive device Prostheses   Gait Pattern Step-through pattern;Within Functional Limits   Ambulation Surface Indoor;Level;Outdoor;Paved;Gravel;Grass   Gait velocity 3.86 ft/sec comfortable; 4.76 ft/sec fast   Stairs Yes   Stairs Assistance 6: Modified independent (Device/Increase time)   Stair Management Technique Two rails;Alternating pattern;Forwards;One rail Right;One rail Left  descending with single rail has instability with new ht   Number of Stairs 4  5 reps   Ramp 6: Modified independent (Device)  prostheses only   Curb 5: Supervision  prostheses only   Curb Details (indicate cue type and reason) patient needs UE touch on cane or car with new prostheses' ht. He is modified independent with touch.    Gait Comments modified independent with direction changes including turning 180*   Neuro Re-ed    Neuro Re-ed Details  --   Prosthetics   Prosthetic Care Comments  Prosthesis with rotator distal to socket. Patient reports liner against skin is old ones but outer suction seal are new ones. New inner liners are thin per pt report (probably 66mm compared to 19mm). PT recommended patient see prosthetist to determine if changes need to be made or liners returned within allowable time. PT verbalized  understanding.    Current prosthetic wear tolerance (days/week)  7 days /wk   Current prosthetic wear tolerance (#hours/day)  all awake hours   Edema none   Residual limb condition  no issues   Education Provided Other (comment)  see prosthetic care   Person(s) Educated Patient;Spouse   Education Method Explanation;Verbal cues   Education Method Verbalized understanding                  PT Short Term Goals - 06/20/15 1705    PT SHORT TERM GOAL #1   Title Patient demonstrates understanding of updated HEP (Target Date: 05/26/2015)   Baseline MET 05/23/2015   Time 4   Period Weeks   Status Achieved   PT SHORT TERM GOAL #2   Title Patient ambulates with prostheses only with head turns to scan environment maintaining path.  (Target Date: 05/26/2015)   Baseline MET 05/23/2015   Time 4   Period Weeks   Status Achieved   PT SHORT TERM GOAL #3   Title Patient able to reach 10" anteriorly and pick up object from floor safely without UE support.  (Target Date: 05/26/2015)   Baseline MET 05/23/2015   Time 4   Period Weeks   Status Achieved   PT SHORT TERM GOAL #4   Title Patient negotiates stairs with single rail safely with supervision.  (Target Date: 05/26/2015)   Baseline MET 05/23/2015   Time 4   Period Weeks   Status Achieved   PT SHORT TERM GOAL #5   Title Berg Balance >/= 42/56 (Target Date 06/23/2015)   Baseline MET 06/20/2015 Berg Balance 45/56   Time 4   Period Weeks   Status Achieved   PT SHORT TERM GOAL #6   Title Patient ambulates 500' outside surfaces including negotiating ramps & curbs with prostheses only with supervision. (Target Date 06/23/2015)   Baseline MET 06/19/2014    Time 4   Period Weeks   Status Achieved   PT SHORT TERM GOAL #7   Title Patient able to increase gait velocity at fast pace to >4.5 ft/sec to enable to safely cross a street. (Target Date 06/23/2015)   Baseline MET 06/20/2015 Fast gait velocity 4.51 ft/sec   Status Achieved           PT Long Term Goals -  08/24/15 1630    PT LONG TERM GOAL #1   Title Patient verbalizes & demonstrates understanding of ongoing fitness plan / HEP.  (Target Date: 07/21/2015)   Baseline MET 08/24/2015   Time 12   Period Weeks   Status Achieved   PT LONG TERM GOAL #2   Title Berg Balance >45/56 to reduce risk of falls with standing activities. (Target Date: 07/21/2015)   Baseline MET 08/24/2015 Merrilee Jansky Balance 47/56   Time 12   Period Weeks   Status Achieved   PT LONG TERM GOAL #3   Title Functional Gait Assessment >20/30 to reduce fall risk with gait activities. (Target Date: 07/21/2015)   Baseline MET 08/24/2015 FGA 23/30   Time 12   Period Weeks   Status Achieved   PT LONG TERM GOAL #4   Title Patient negotiates ramps & curbs without device and stairs with single rail only with prostheses modified independent. (Target Date: 07/21/2015)   Baseline Partially MET 08/24/2015  Recent height increases of ~3-4" total patient needs UE touch to negoatiate curbs and single rail stairs.    Time 12   Period Weeks   Status Partially Met  Plan - 08/24/15 1630    Clinical Impression Statement Patient met or partially met all LTGs. Patient's prostheses have been increased over the last 2 months to his previous height prior to amputations and had rotators added at socket-pylon interface. Patient is still getting used to new height & increased mobility. His knees are working in new ranges of movement which are weaker at those new end ranges like getting out of chair, curbs and stairs. Patient reports understanding of HEP / ongoing fitness plan. He reports he is comfortable with current level of function and with instruction from PT he can continue to progress with new height & mobility.    Rehab Potential Good   PT Frequency Other (comment)  After eval, 2x/wk for 3 weeks, then 1x/wk for 9 weeks (06/27/15 switched to 1x/wk every 2-3 weeks to enable ht changes followed by instruction)   PT Duration 12 weeks   PT  Treatment/Interventions ADLs/Self Care Home Management;Gait training;Stair training;Functional mobility training;Therapeutic activities;Therapeutic exercise;Balance training;Neuromuscular re-education;Patient/family education;Prosthetic Training   PT Next Visit Plan discharge   Consulted and Agree with Plan of Care Patient;Family member/caregiver   Family Member Consulted wife, [name]      Patient will benefit from skilled therapeutic intervention in order to improve the following deficits and impairments:  Abnormal gait, Decreased balance, Decreased endurance, Decreased mobility, Decreased strength, Prosthetic Dependency, Increased fascial restricitons  Visit Diagnosis: Other abnormalities of gait and mobility  Unsteadiness on feet  Muscle weakness (generalized)     Problem List Patient Active Problem List   Diagnosis Date Noted  . Abnormal cardiovascular stress test 12/29/2013  . Awaiting organ transplant 12/09/2013  . Leg ulcer ([place]) 11/17/2013  . Chronic kidney disease (CKD), stage V ([place]) 11/17/2013  . Diabetes ([place]) 11/17/2013  . Cancer of duodenum ([place]) 11/17/2013  . Dyslipidemia 11/17/2013  . Chronic kidney disease requiring chronic dialysis ([place]) 11/17/2013  . Hypercholesteremia 11/17/2013  . H/O malignant neoplasm 11/17/2013  . H/O: HTN (hypertension) 11/17/2013  . BP (high blood pressure) 11/17/2013  . Hypotension 11/17/2013  . Angiopathy, peripheral ([place]) 11/17/2013  . AF (paroxysmal atrial fibrillation) ([place]) 11/17/2013  . BKA stump complication ([place]) 82/50/5397  . S/P bilateral BKA (below knee amputation) ([place]) 08/03/2013  . S/P BKA (below knee amputation) bilateral ([place]) 07/30/2013  . Open wound of both legs with complication 67/34/1937  . Acute blood loss anemia 07/12/2013  . Syncope 07/09/2013  . Intolerance to CAPD peritoneal dialysis s/p CAPD cath removal 07/09/2013 07/05/2013  . Critical lower limb ischemia 06/30/2013  . Extremity pain 06/30/2013  .  Atherosclerosis of native arteries of the extremities with ulceration(440.23) 06/17/2013  . Gait disorder 05/31/2013  . Weakness 05/24/2013  . Depression 04/03/2013  . GERD (gastroesophageal reflux disease) 04/03/2013  . Atrial fibrillation ([place]) 04/02/2013  . DM (diabetes mellitus) ([place]) 12/28/2012  . Other complications due to renal dialysis device, implant, and graft 05/19/2012  . Diabetes mellitus, type 2 ([place]) 11/13/2011  . Anemia 07/02/2011  . ESRD on hemodialysis ([place]) 05/29/2011    Seyon Strader PT, DPT 08/25/2015, 7:52 AM  [facility] [address] [place], [place], [address] Phone: [phone]   Fax:  [phone]  Name: Paul [name] MRN: [id number] Date of Birth: [birth year]  PHYSICAL THERAPY DISCHARGE SUMMARY  Visits from Start of Care: 16  Current functional level related to goals / functional outcomes: See above   Remaining deficits: See above   Education / Equipment: HEP/ fitness plan, prosthetic care & use Plan:  Patient agrees to discharge.  Patient goals were met. Patient is being discharged due to meeting the stated rehab goals.  ?????

## 2016-04-22 ENCOUNTER — Ambulatory Visit (INDEPENDENT_AMBULATORY_CARE_PROVIDER_SITE_OTHER): Payer: Managed Care, Other (non HMO) | Admitting: Orthopedic Surgery

## 2016-04-22 ENCOUNTER — Encounter (INDEPENDENT_AMBULATORY_CARE_PROVIDER_SITE_OTHER): Payer: Self-pay | Admitting: Orthopedic Surgery

## 2016-04-22 VITALS — Ht 73.0 in | Wt 185.0 lb

## 2016-04-22 DIAGNOSIS — M25562 Pain in left knee: Secondary | ICD-10-CM | POA: Diagnosis not present

## 2016-04-22 DIAGNOSIS — Z89512 Acquired absence of left leg below knee: Secondary | ICD-10-CM | POA: Diagnosis not present

## 2016-04-22 DIAGNOSIS — M25561 Pain in right knee: Secondary | ICD-10-CM | POA: Diagnosis not present

## 2016-04-22 DIAGNOSIS — Z89511 Acquired absence of right leg below knee: Secondary | ICD-10-CM | POA: Diagnosis not present

## 2016-04-22 NOTE — Progress Notes (Signed)
Office Visit Note   Patient: Paul [name]           Date of Birth: [birth year]           MRN: [id number] Visit Date: 04/22/2016              Requested by: [name], MD [address] [place], [place] [address] PCP: [name], MD   Assessment & Plan: Visit Diagnoses:  1. S/P bilateral BKA (below knee amputation) ([place])     Plan: Have provided him with a prescription for new socket bilaterally. He needs a sockets as well as new liners. We'll follow with [facility] for these. Follow-up in office with [place] as needed.  Follow-Up Instructions: Return if symptoms worsen or fail to improve.   Orders:  No orders of the defined types were placed in this encounter.  No orders of the defined types were placed in this encounter.     Procedures: No procedures performed   Clinical Data: No additional findings.   Subjective: Chief Complaint  Patient presents with  . Right Knee - Pain  . Left Knee - Pain    Patient is a 60 year old gentleman who presents today for bilateral prosthetic evaluation. He is status post bilateral below the knee amputation 09/03/2013. He feels like prosthetics don't feel quite secure. Has been having to use increased apply on the right. There is breakdown of both of his liners. These are in poor repair. He is needing new prescription today for [facility]. He has phantom pain right residual limb. No wounds.    Review of Systems  Constitutional: Negative for chills and fever.     Objective: Vital Signs: Ht 6' 1" (1.854 m)   Wt 185 lb (83.9 kg)   BMI 24.41 kg/m   Physical Exam  Constitutional: He is oriented to person, place, and time. He appears well-developed and well-nourished.  Pulmonary/Chest: Effort normal.  Musculoskeletal:  Bilateral residual limbs well healed. Well consolidated. There are no open ulcers does have 2 superficial calluses to the distal right residual limb. No drainage erythema or sign of infection.    Neurological: He is alert and oriented to person, place, and time.  Psychiatric: He has a normal mood and affect.  Nursing note reviewed.   Ortho Exam  Specialty Comments:  No specialty comments available.  Imaging: No results found.   PMFS History: Patient Active Problem List   Diagnosis Date Noted  . Abnormal cardiovascular stress test 12/29/2013  . Awaiting organ transplant 12/09/2013  . Leg ulcer ([place]) 11/17/2013  . Chronic kidney disease (CKD), stage V ([place]) 11/17/2013  . Diabetes ([place]) 11/17/2013  . Cancer of duodenum ([place]) 11/17/2013  . Dyslipidemia 11/17/2013  . Chronic kidney disease requiring chronic dialysis ([place]) 11/17/2013  . Hypercholesteremia 11/17/2013  . H/O malignant neoplasm 11/17/2013  . H/O: HTN (hypertension) 11/17/2013  . BP (high blood pressure) 11/17/2013  . Hypotension 11/17/2013  . Angiopathy, peripheral ([place]) 11/17/2013  . AF (paroxysmal atrial fibrillation) ([place]) 11/17/2013  . BKA stump complication ([place]) 50/53/9767  . S/P bilateral BKA (below knee amputation) ([place]) 08/03/2013  . S/P BKA (below knee amputation) bilateral ([place]) 07/30/2013  . Open wound of both legs with complication 34/19/3790  . Acute blood loss anemia 07/12/2013  . Syncope 07/09/2013  . Intolerance to CAPD peritoneal dialysis s/p CAPD cath removal 07/09/2013 07/05/2013  . Critical lower limb ischemia 06/30/2013  . Extremity pain 06/30/2013  . Atherosclerosis of native arteries of the extremities with ulceration(440.23)  06/17/2013  . Gait disorder 05/31/2013  . Weakness 05/24/2013  . Depression 04/03/2013  . GERD (gastroesophageal reflux disease) 04/03/2013  . Atrial fibrillation ([place]) 04/02/2013  . DM (diabetes mellitus) ([place]) 12/28/2012  . Other complications due to renal dialysis device, implant, and graft 05/19/2012  . Diabetes mellitus, type 2 ([place]) 11/13/2011  . Anemia 07/02/2011  . ESRD on hemodialysis ([place]) 05/29/2011   Past Medical History:  Diagnosis Date   . A-fib ([place])   . Anemia    esrd  . Calciphylaxis 05/2013   lower ext  . Cancer ([place])    hx duodenal adenoCa 2002, resected  . Closed left arm fracture 1967  . Corneal abrasion, left   . Depression   . Diabetes mellitus    Type 1 iddm x 11 yrs  . Diabetic neuropathy ([place])   . ESRD on hemodialysis ([place])    Pt has ESRD due to DM.  He had a L upper arm AVF prior to starting HD which was ligated due to L arm steal syndrome.  He started HD in Jan 2014 and did home HD.  The family couldn't cannulate the R arm AVF successfully so by mid 2014 they decided to switch to PD which was done late summer 2014.  In Jan 2015 he was admitted with FTT felt to be due to underdialysis on PD and PD was abandoned and he   . GERD (gastroesophageal reflux disease)   . Glomerulosclerosis, diabetic ([place])   . Heart murmur   . History of blood transfusion   . Hypertension    off of meds due to orthostatic hypotension  . Open wound of both legs with complication    Pt has had progressive wounds of both LE's including gangrene of the toes and patchy necrosis of the calves.  He has been treated at [facility] by Dr Jerline Pain with hyperbaric O2 5d per week.  He is getting Na Thiosulfate with HD for suspected calciphylaxis. Pt says doctor's aren't sure if these ulcers were diabetic ulcers or calciphylaxis.  He underwent bilat BKA on 07/30/13.  He says that the woun    Family History  Problem Relation Age of Onset  . Cancer Mother   . Cancer Father     prostate cancer  . Heart disease Maternal Grandfather   . Stroke Paternal Grandmother     Past Surgical History:  Procedure Laterality Date  . AMPUTATION Bilateral 07/30/2013   Procedure: AMPUTATION BELOW KNEE;  Surgeon: Newt Minion, MD;  Location: [place];  Service: Orthopedics;  Laterality: Bilateral;  Bilateral Below Knee Amputations  . AMPUTATION Bilateral 09/03/2013   Procedure: AMPUTATION BELOW KNEE;  Surgeon: Newt Minion, MD;  Location: [facility];  Service:  Orthopedics;  Laterality: Bilateral;  Bilateral Below Knee Amputation Revision  . AV FISTULA PLACEMENT  06/07/2011   Procedure: ARTERIOVENOUS (AV) FISTULA CREATION;  Surgeon: Angelia Mould, MD;  Location: [place];  Service: Vascular;  Laterality: Left;  . AV FISTULA PLACEMENT  06/18/2012   Procedure: ARTERIOVENOUS (AV) FISTULA CREATION;  Surgeon: Mal Misty, MD;  Location: [place];  Service: Vascular;  Laterality: Right;  [place]  . BILIARY DIVERSION, EXTERNAL  2002  . BONE MARROW BIOPSY  2012  . CAPD INSERTION N/A 01/14/2013   Procedure: LAPAROSCOPIC INSERTION CONTINUOUS AMBULATORY PERITONEAL DIALYSIS  (CAPD) CATHETER;  Surgeon: Adin Hector, MD;  Location: [place];  Service: General;  Laterality: N/A;  . CAPD REMOVAL N/A 07/09/2013   Procedure: CONTINUOUS AMBULATORY PERITONEAL  DIALYSIS  (CAPD) CATHETER REMOVAL;  Surgeon: Adin Hector, MD;  Location: [place];  Service: General;  Laterality: N/A;  . COLONOSCOPY     Hx: of  . INSERTION OF DIALYSIS CATHETER  05/22/2012   Procedure: INSERTION OF DIALYSIS CATHETER;  Surgeon: Mal Misty, MD;  Location: [place];  Service: Vascular;  Laterality: N/A;  right internal jugular vein  . LIGATION OF ARTERIOVENOUS  FISTULA  05/22/2012   Procedure: LIGATION OF ARTERIOVENOUS  FISTULA;  Surgeon: Mal Misty, MD;  Location: [place];  Service: Vascular;  Laterality: Left;  Left brachio-cephalic fistula  . LOWER EXTREMITY ANGIOGRAM N/A 06/14/2013   Procedure: LOWER EXTREMITY ANGIOGRAM;  Surgeon: Angelia Mould, MD;  Location: [facility] CATH LAB;  Service: Cardiovascular;  Laterality: N/A;  . NEPHRECTOMY TRANSPLANTED ORGAN    . OTHER SURGICAL HISTORY     Cyst removed from back  . PORTACATH PLACEMENT  2002  . REMOVAL OF A DIALYSIS CATHETER    . RENAL BIOPSY, PERCUTANEOUS  2012  . TONSILLECTOMY    . VASCULAR SURGERY    . WHIPPLE PROCEDURE  2002   duodenal ca, Dr. Harlow Asa   Social History   Occupational History  . Not on file.   Social History Main  Topics  . Smoking status: Never Smoker  . Smokeless tobacco: Never Used  . Alcohol use No  . Drug use: No  . Sexual activity: Not on file

## 2016-05-27 DIAGNOSIS — Z940 Kidney transplant status: Secondary | ICD-10-CM | POA: Diagnosis not present

## 2016-05-27 DIAGNOSIS — Z5181 Encounter for therapeutic drug level monitoring: Secondary | ICD-10-CM | POA: Diagnosis not present

## 2016-06-13 DIAGNOSIS — L02429 Furuncle of limb, unspecified: Secondary | ICD-10-CM | POA: Diagnosis not present

## 2016-06-24 DIAGNOSIS — Z940 Kidney transplant status: Secondary | ICD-10-CM | POA: Diagnosis not present

## 2016-06-24 DIAGNOSIS — Z5181 Encounter for therapeutic drug level monitoring: Secondary | ICD-10-CM | POA: Diagnosis not present

## 2016-07-24 DIAGNOSIS — Z89511 Acquired absence of right leg below knee: Secondary | ICD-10-CM | POA: Diagnosis not present

## 2016-07-24 DIAGNOSIS — Z89512 Acquired absence of left leg below knee: Secondary | ICD-10-CM | POA: Diagnosis not present

## 2016-07-24 DIAGNOSIS — Z794 Long term (current) use of insulin: Secondary | ICD-10-CM | POA: Diagnosis not present

## 2016-07-24 DIAGNOSIS — E1122 Type 2 diabetes mellitus with diabetic chronic kidney disease: Secondary | ICD-10-CM | POA: Diagnosis not present

## 2016-07-29 DIAGNOSIS — N2581 Secondary hyperparathyroidism of renal origin: Secondary | ICD-10-CM | POA: Diagnosis not present

## 2016-07-29 DIAGNOSIS — E1129 Type 2 diabetes mellitus with other diabetic kidney complication: Secondary | ICD-10-CM | POA: Diagnosis not present

## 2016-07-29 DIAGNOSIS — Z940 Kidney transplant status: Secondary | ICD-10-CM | POA: Diagnosis not present

## 2016-07-29 DIAGNOSIS — I739 Peripheral vascular disease, unspecified: Secondary | ICD-10-CM | POA: Diagnosis not present

## 2016-07-29 DIAGNOSIS — E785 Hyperlipidemia, unspecified: Secondary | ICD-10-CM | POA: Diagnosis not present

## 2016-07-29 DIAGNOSIS — I129 Hypertensive chronic kidney disease with stage 1 through stage 4 chronic kidney disease, or unspecified chronic kidney disease: Secondary | ICD-10-CM | POA: Diagnosis not present

## 2016-09-30 DIAGNOSIS — Z940 Kidney transplant status: Secondary | ICD-10-CM | POA: Diagnosis not present

## 2016-09-30 DIAGNOSIS — F4325 Adjustment disorder with mixed disturbance of emotions and conduct: Secondary | ICD-10-CM | POA: Diagnosis not present

## 2016-10-21 DIAGNOSIS — E1100 Type 2 diabetes mellitus with hyperosmolarity without nonketotic hyperglycemic-hyperosmolar coma (NKHHC): Secondary | ICD-10-CM | POA: Diagnosis not present

## 2016-10-28 ENCOUNTER — Emergency Department (HOSPITAL_BASED_OUTPATIENT_CLINIC_OR_DEPARTMENT_OTHER)
Admission: EM | Admit: 2016-10-28 | Discharge: 2016-10-28 | Disposition: A | Discharge status: Other Acute Inpatient Hospital | Payer: Medicare Other | Attending: Emergency Medicine | Admitting: Emergency Medicine

## 2016-10-28 ENCOUNTER — Encounter (HOSPITAL_BASED_OUTPATIENT_CLINIC_OR_DEPARTMENT_OTHER): Payer: Self-pay | Admitting: *Deleted

## 2016-10-28 DIAGNOSIS — E860 Dehydration: Secondary | ICD-10-CM | POA: Diagnosis not present

## 2016-10-28 DIAGNOSIS — Z79899 Other long term (current) drug therapy: Secondary | ICD-10-CM | POA: Insufficient documentation

## 2016-10-28 DIAGNOSIS — N179 Acute kidney failure, unspecified: Secondary | ICD-10-CM | POA: Diagnosis not present

## 2016-10-28 DIAGNOSIS — N185 Chronic kidney disease, stage 5: Secondary | ICD-10-CM | POA: Insufficient documentation

## 2016-10-28 DIAGNOSIS — I4892 Unspecified atrial flutter: Secondary | ICD-10-CM | POA: Diagnosis not present

## 2016-10-28 DIAGNOSIS — I4891 Unspecified atrial fibrillation: Secondary | ICD-10-CM | POA: Diagnosis not present

## 2016-10-28 DIAGNOSIS — R112 Nausea with vomiting, unspecified: Secondary | ICD-10-CM | POA: Diagnosis not present

## 2016-10-28 DIAGNOSIS — E1122 Type 2 diabetes mellitus with diabetic chronic kidney disease: Secondary | ICD-10-CM | POA: Insufficient documentation

## 2016-10-28 DIAGNOSIS — R1084 Generalized abdominal pain: Secondary | ICD-10-CM | POA: Diagnosis not present

## 2016-10-28 DIAGNOSIS — Z794 Long term (current) use of insulin: Secondary | ICD-10-CM | POA: Diagnosis not present

## 2016-10-28 DIAGNOSIS — Z7982 Long term (current) use of aspirin: Secondary | ICD-10-CM | POA: Diagnosis not present

## 2016-10-28 DIAGNOSIS — I120 Hypertensive chronic kidney disease with stage 5 chronic kidney disease or end stage renal disease: Secondary | ICD-10-CM | POA: Diagnosis not present

## 2016-10-28 DIAGNOSIS — E1140 Type 2 diabetes mellitus with diabetic neuropathy, unspecified: Secondary | ICD-10-CM | POA: Insufficient documentation

## 2016-10-28 DIAGNOSIS — Z992 Dependence on renal dialysis: Secondary | ICD-10-CM | POA: Insufficient documentation

## 2016-10-28 DIAGNOSIS — C170 Malignant neoplasm of duodenum: Secondary | ICD-10-CM | POA: Insufficient documentation

## 2016-10-28 DIAGNOSIS — N200 Calculus of kidney: Secondary | ICD-10-CM | POA: Diagnosis not present

## 2016-10-28 DIAGNOSIS — R05 Cough: Secondary | ICD-10-CM | POA: Diagnosis not present

## 2016-10-28 DIAGNOSIS — I2510 Atherosclerotic heart disease of native coronary artery without angina pectoris: Secondary | ICD-10-CM | POA: Insufficient documentation

## 2016-10-28 DIAGNOSIS — Z940 Kidney transplant status: Secondary | ICD-10-CM | POA: Diagnosis not present

## 2016-10-28 DIAGNOSIS — E1110 Type 2 diabetes mellitus with ketoacidosis without coma: Secondary | ICD-10-CM | POA: Diagnosis not present

## 2016-10-28 DIAGNOSIS — R1110 Vomiting, unspecified: Secondary | ICD-10-CM | POA: Diagnosis not present

## 2016-10-28 DIAGNOSIS — R531 Weakness: Secondary | ICD-10-CM | POA: Diagnosis not present

## 2016-10-28 DIAGNOSIS — R000 Tachycardia, unspecified: Secondary | ICD-10-CM | POA: Diagnosis not present

## 2016-10-28 HISTORY — DX: Kidney transplant status: Z94.0

## 2016-10-28 LAB — URINALYSIS, MICROSCOPIC (REFLEX): WBC UA: NONE SEEN WBC/hpf (ref 0–5)

## 2016-10-28 LAB — CBC WITH DIFFERENTIAL/PLATELET
BASOS PCT: 0 %
Basophils Absolute: 0 10*3/uL (ref 0.0–0.1)
EOS PCT: 1 %
Eosinophils Absolute: 0.1 10*3/uL (ref 0.0–0.7)
HCT: 33.8 % — ABNORMAL LOW (ref 39.0–52.0)
Hemoglobin: 11.1 g/dL — ABNORMAL LOW (ref 13.0–17.0)
LYMPHS PCT: 18 %
Lymphs Abs: 1 10*3/uL (ref 0.7–4.0)
MCH: 28.9 pg (ref 26.0–34.0)
MCHC: 32.8 g/dL (ref 30.0–36.0)
MCV: 88 fL (ref 78.0–100.0)
Monocytes Absolute: 0.3 10*3/uL (ref 0.1–1.0)
Monocytes Relative: 6 %
NEUTROS PCT: 75 %
Neutro Abs: 4.4 10*3/uL (ref 1.7–7.7)
Platelets: 150 10*3/uL (ref 150–400)
RBC: 3.84 MIL/uL — ABNORMAL LOW (ref 4.22–5.81)
RDW: 14.2 % (ref 11.5–15.5)
WBC: 5.8 10*3/uL (ref 4.0–10.5)

## 2016-10-28 LAB — COMPREHENSIVE METABOLIC PANEL
ALT: 13 U/L — ABNORMAL LOW (ref 17–63)
ANION GAP: 11 (ref 5–15)
AST: 11 U/L — ABNORMAL LOW (ref 15–41)
Albumin: 3.7 g/dL (ref 3.5–5.0)
Alkaline Phosphatase: 144 U/L — ABNORMAL HIGH (ref 38–126)
BUN: 79 mg/dL — ABNORMAL HIGH (ref 6–20)
CO2: 9 mmol/L — AB (ref 22–32)
Calcium: 7.9 mg/dL — ABNORMAL LOW (ref 8.9–10.3)
Chloride: 119 mmol/L — ABNORMAL HIGH (ref 101–111)
Creatinine, Ser: 4.09 mg/dL — ABNORMAL HIGH (ref 0.61–1.24)
GFR calc non Af Amer: 14 mL/min — ABNORMAL LOW (ref >60)
GFR, EST AFRICAN AMERICAN: 16 mL/min — AB (ref >60)
Glucose, Bld: 255 mg/dL — ABNORMAL HIGH (ref 65–99)
Potassium: 4.5 mmol/L (ref 3.5–5.1)
SODIUM: 139 mmol/L (ref 135–145)
Total Bilirubin: 0.8 mg/dL (ref 0.3–1.2)
Total Protein: 6.2 g/dL — ABNORMAL LOW (ref 6.5–8.1)

## 2016-10-28 LAB — CBG MONITORING, ED: Glucose-Capillary: 215 mg/dL — ABNORMAL HIGH (ref 65–99)

## 2016-10-28 LAB — URINALYSIS, ROUTINE W REFLEX MICROSCOPIC
Bilirubin Urine: NEGATIVE
Glucose, UA: NEGATIVE mg/dL
Ketones, ur: 15 mg/dL — AB
Leukocytes, UA: NEGATIVE
Nitrite: NEGATIVE
Protein, ur: 30 mg/dL — AB
Specific Gravity, Urine: 1.009 (ref 1.005–1.030)
pH: 5 (ref 5.0–8.0)

## 2016-10-28 MED ORDER — SODIUM CHLORIDE 0.9 % IV BOLUS (SEPSIS)
1000.0000 mL | Freq: Once | INTRAVENOUS | Status: AC
  Administered 2016-10-28: 1000 mL via INTRAVENOUS

## 2016-10-28 MED ORDER — SODIUM CHLORIDE 0.9 % IV SOLN: INTRAVENOUS | Status: DC

## 2016-10-28 MED ORDER — ONDANSETRON HCL 4 MG/2ML IJ SOLN
4.0000 mg | Freq: Once | INTRAMUSCULAR | Status: AC
  Administered 2016-10-28: 4 mg via INTRAVENOUS
  Filled 2016-10-28: qty 2

## 2016-10-28 MED ORDER — DILTIAZEM LOAD VIA INFUSION
10.0000 mg | Freq: Once | INTRAVENOUS | Status: AC
  Administered 2016-10-28: 10 mg via INTRAVENOUS
  Filled 2016-10-28: qty 10

## 2016-10-28 MED ORDER — DILTIAZEM HCL 100 MG IV SOLR
5.0000 mg/h | INTRAVENOUS | Status: DC
  Administered 2016-10-28: 10 mg/h via INTRAVENOUS
  Administered 2016-10-28: 5 mg/h via INTRAVENOUS
  Filled 2016-10-28: qty 100

## 2016-10-28 NOTE — ED Notes (Signed)
Pt radial pulse in 40s. Pt states "it feels like my heart is beating out of my chest". EKG ordered

## 2016-10-28 NOTE — ED Notes (Signed)
Pt is in NSR per bedside cardiac monitor.

## 2016-10-28 NOTE — ED Notes (Signed)
Pt on cardiac monitor - currently in Big[place] Rhythm, ED provider aware.

## 2016-10-28 NOTE — ED Triage Notes (Signed)
Pt sent from [facility] walk-in clinic. Reports 3 days of n/v, weakness. Pt Has hx of kidney transplant and bilateral AKA. Renal MD is requesting lab work per pt report

## 2016-10-28 NOTE — ED Notes (Signed)
Pt on monitor 

## 2016-10-28 NOTE — ED Notes (Signed)
Pt now in [place] updated. Cardizem gtt stopped.

## 2016-10-28 NOTE — ED Notes (Signed)
[facility] @ [phone]

## 2016-10-28 NOTE — ED Notes (Signed)
O2 applied at 2 lpm via [place].

## 2016-10-28 NOTE — ED Notes (Signed)
Pt is in A fib 130-140 per bedside monitor, DR Long notified.

## 2016-10-28 NOTE — ED Provider Notes (Signed)
[facility] DEPT [facility] Provider Note   CSN: [id number] Arrival date & time: 10/28/16  1825  By signing my name below, I, Margit Banda, attest that this documentation has been prepared under the direction and in the presence of Ollis Daudelin, PA-C. Electronically Signed: Margit Banda, ED Scribe. 10/28/16. 6:55 PM.  History   Chief Complaint Chief Complaint  Patient presents with  . Emesis    HPI Paul [name] is a 61 y.o. male from [facility] walk-in clinic with a PMHx of DM, kidney transplant and bilateral AKA, who presents to the Emergency Department complaining of emesis since Thursday, 10/24/16. He has vomited fluid 2-3 times per day. Associated sx include nausea, weakness, dry cough, and rhinorrhea. Pt's renal MD is requesting lab work per pt report. Pt denies fever, diarrhea, hematemesis, abdominal pain, lightheaded, dizziness, SOB, CP, sneezing, blurred vision, HA, LOC, urgency, frequency, hematuria, dysuria, and difficulty urinating.  The history is provided by the patient. No language interpreter was used.    Past Medical History:  Diagnosis Date  . A-fib ([place])   . Anemia    esrd  . Calciphylaxis 05/2013   lower ext  . Cancer ([place])    hx duodenal adenoCa 2002, resected  . Closed left arm fracture 1967  . Corneal abrasion, left   . Depression   . Diabetes mellitus    Type 1 iddm x 11 yrs  . Diabetic neuropathy ([place])   . ESRD on hemodialysis ([place])    Pt has ESRD due to DM.  He had a L upper arm AVF prior to starting HD which was ligated due to L arm steal syndrome.  He started HD in Jan 2014 and did home HD.  The family couldn't cannulate the R arm AVF successfully so by mid 2014 they decided to switch to PD which was done late summer 2014.  In Jan 2015 he was admitted with FTT felt to be due to underdialysis on PD and PD was abandoned and he   . GERD (gastroesophageal reflux disease)   . Glomerulosclerosis, diabetic ([place])   . Heart murmur   . History of blood transfusion    . Hypertension    off of meds due to orthostatic hypotension  . Kidney transplant recipient   . Open wound of both legs with complication    Pt has had progressive wounds of both LE's including gangrene of the toes and patchy necrosis of the calves.  He has been treated at [facility] by Dr Jerline Pain with hyperbaric O2 5d per week.  He is getting Na Thiosulfate with HD for suspected calciphylaxis. Pt says doctor's aren't sure if these ulcers were diabetic ulcers or calciphylaxis.  He underwent bilat BKA on 07/30/13.  He says that the woun    Patient Active Problem List   Diagnosis Date Noted  . Abnormal cardiovascular stress test 12/29/2013  . Awaiting organ transplant 12/09/2013  . Leg ulcer ([place]) 11/17/2013  . Chronic kidney disease (CKD), stage V ([place]) 11/17/2013  . Diabetes ([place]) 11/17/2013  . Cancer of duodenum ([place]) 11/17/2013  . Dyslipidemia 11/17/2013  . Chronic kidney disease requiring chronic dialysis ([place]) 11/17/2013  . Hypercholesteremia 11/17/2013  . H/O malignant neoplasm 11/17/2013  . H/O: HTN (hypertension) 11/17/2013  . BP (high blood pressure) 11/17/2013  . Hypotension 11/17/2013  . Angiopathy, peripheral ([place]) 11/17/2013  . AF (paroxysmal atrial fibrillation) ([name]) 11/17/2013  . BKA stump complication ([place]) 40/97/3532  . S/P bilateral BKA (below knee amputation) ([place]) 08/03/2013  . S/P  BKA (below knee amputation) bilateral ([place]) 07/30/2013  . Open wound of both legs with complication 16/02/9603  . Acute blood loss anemia 07/12/2013  . Syncope 07/09/2013  . Intolerance to CAPD peritoneal dialysis s/p CAPD cath removal 07/09/2013 07/05/2013  . Critical lower limb ischemia 06/30/2013  . Extremity pain 06/30/2013  . Atherosclerosis of native arteries of the extremities with ulceration(440.23) 06/17/2013  . Gait disorder 05/31/2013  . Weakness 05/24/2013  . Depression 04/03/2013  . GERD (gastroesophageal reflux disease) 04/03/2013  . Atrial fibrillation ([place])  04/02/2013  . DM (diabetes mellitus) ([place]) 12/28/2012  . Other complications due to renal dialysis device, implant, and graft 05/19/2012  . Diabetes mellitus, type 2 ([place]) 11/13/2011  . Anemia 07/02/2011  . ESRD on hemodialysis ([place]) 05/29/2011    Past Surgical History:  Procedure Laterality Date  . AMPUTATION Bilateral 07/30/2013   Procedure: AMPUTATION BELOW KNEE;  Surgeon: Newt Minion, MD;  Location: [facility];  Service: Orthopedics;  Laterality: Bilateral;  Bilateral Below Knee Amputations  . AMPUTATION Bilateral 09/03/2013   Procedure: AMPUTATION BELOW KNEE;  Surgeon: Newt Minion, MD;  Location: [facility] [place];  Service: Orthopedics;  Laterality: Bilateral;  Bilateral Below Knee Amputation Revision  . AV FISTULA PLACEMENT  06/07/2011   Procedure: ARTERIOVENOUS (AV) FISTULA CREATION;  Surgeon: Angelia Mould, MD;  Location: [facility];  Service: Vascular;  Laterality: Left;  . AV FISTULA PLACEMENT  06/18/2012   Procedure: ARTERIOVENOUS (AV) FISTULA CREATION;  Surgeon: Mal Misty, MD;  Location: [facility];  Service: Vascular;  Laterality: Right;  [place]  . BILIARY DIVERSION, EXTERNAL  2002  . BONE MARROW BIOPSY  2012  . CAPD INSERTION N/A 01/14/2013   Procedure: LAPAROSCOPIC INSERTION CONTINUOUS AMBULATORY PERITONEAL DIALYSIS  (CAPD) CATHETER;  Surgeon: Adin Hector, MD;  Location: [facility];  Service: General;  Laterality: N/A;  . CAPD REMOVAL N/A 07/09/2013   Procedure: CONTINUOUS AMBULATORY PERITONEAL DIALYSIS  (CAPD) CATHETER REMOVAL;  Surgeon: Adin Hector, MD;  Location: [facility];  Service: General;  Laterality: N/A;  . COLONOSCOPY     Hx: of  . INSERTION OF DIALYSIS CATHETER  05/22/2012   Procedure: INSERTION OF DIALYSIS CATHETER;  Surgeon: Mal Misty, MD;  Location: [facility];  Service: Vascular;  Laterality: N/A;  right internal jugular vein  . LIGATION OF ARTERIOVENOUS  FISTULA  05/22/2012   Procedure: LIGATION OF ARTERIOVENOUS  FISTULA;  Surgeon: Mal Misty, MD;  Location: [place];   Service: Vascular;  Laterality: Left;  Left brachio-cephalic fistula  . LOWER EXTREMITY ANGIOGRAM N/A 06/14/2013   Procedure: LOWER EXTREMITY ANGIOGRAM;  Surgeon: Angelia Mould, MD;  Location: [facility] CATH LAB;  Service: Cardiovascular;  Laterality: N/A;  . NEPHRECTOMY TRANSPLANTED ORGAN    . OTHER SURGICAL HISTORY     Cyst removed from back  . PORTACATH PLACEMENT  2002  . REMOVAL OF A DIALYSIS CATHETER    . RENAL BIOPSY, PERCUTANEOUS  2012  . TONSILLECTOMY    . VASCULAR SURGERY    . WHIPPLE PROCEDURE  2002   duodenal ca, Dr. Harlow Asa       Home Medications    Prior to Admission medications   Medication Sig Start Date End Date Taking? Authorizing Provider  amLODipine (NORVASC) 10 MG tablet Take 10 mg by mouth daily.   Yes [provider]  aspirin EC 81 MG tablet Take 81 mg by mouth daily.   Yes [provider]  cholecalciferol (VITAMIN D) 1000 UNITS tablet Take 1,000 Units by  mouth daily.   Yes [provider]  insulin glargine (LANTUS) 100 UNIT/ML injection Inject 12 Units into the skin 2 (two) times daily.    Yes [provider]  insulin lispro (HUMALOG) 100 UNIT/ML injection Inject 6 Units into the skin 3 (three) times daily before meals.    Yes [provider]  magnesium chloride (SLOW-MAG) 64 MG TBEC SR tablet Take by mouth. 08/26/14  Yes [provider]  mycophenolate (MYFORTIC) 180 MG EC tablet Take 180 mg by mouth 2 (two) times daily.   Yes [provider]  omeprazole (PRILOSEC) 40 MG capsule  01/11/14  Yes [provider]  predniSONE (DELTASONE) 5 MG tablet Take 5 mg by mouth daily with breakfast.    Yes [provider]  tacrolimus (PROGRAF) 5 MG capsule Take 25 mg by mouth 2 (two) times daily.    Yes [provider]  tamsulosin (FLOMAX) 0.4 MG CAPS capsule Take 0.4 mg by mouth daily after breakfast.    Yes [provider]  atorvastatin (LIPITOR) 10 MG tablet Take 1 tablet (10 mg  total) by mouth daily. 08/13/13   Angiulli, Mcarthur Rossetti, PA-C    Family History Family History  Problem Relation Age of Onset  . Cancer Mother   . Cancer Father        prostate cancer  . Heart disease Maternal Grandfather   . Stroke Paternal Grandmother     Social History Social History  Substance Use Topics  . Smoking status: Never Smoker  . Smokeless tobacco: Never Used  . Alcohol use No     Allergies   Metformin and related   Review of Systems Review of Systems  Constitutional: Negative for fever.  HENT: Positive for rhinorrhea. Negative for sneezing.   Eyes: Negative for visual disturbance.  Respiratory: Positive for cough. Negative for shortness of breath.   Cardiovascular: Negative for chest pain.  Gastrointestinal: Positive for nausea and vomiting. Negative for abdominal pain and diarrhea.  Genitourinary: Negative for difficulty urinating, dysuria, frequency, hematuria and urgency.  Neurological: Positive for weakness. Negative for dizziness, syncope and headaches.  All other systems reviewed and are negative.    Physical Exam Updated Vital Signs BP 118/71   Pulse (!) 125   Temp 98 F (36.7 C) (Oral)   Resp 16   SpO2 100%   Physical Exam  Constitutional: He is oriented to person, place, and time. He appears well-developed and well-nourished. No distress.  HENT:  Head: Normocephalic and atraumatic.  Eyes: Conjunctivae and EOM are normal. No scleral icterus.  Neck: Normal range of motion.  Cardiovascular: Normal rate, regular rhythm and normal heart sounds.   Pulmonary/Chest: Effort normal and breath sounds normal. No respiratory distress.  Abdominal: Soft. Bowel sounds are normal. He exhibits no distension. There is no tenderness. There is no guarding.  Musculoskeletal:  Bilateral AKA.   Neurological: He is alert and oriented to person, place, and time. No sensory deficit. He exhibits normal muscle tone.  Skin: No rash noted. He is not diaphoretic.    Psychiatric: He has a normal mood and affect.  Nursing note and vitals reviewed.    ED Treatments / Results  DIAGNOSTIC STUDIES: Oxygen Saturation is 97% on RA, normal by my interpretation.   COORDINATION OF CARE: 6:55 PM-Discussed next steps with pt. Pt verbalized understanding and is agreeable with the plan.   Labs (all labs ordered are listed, but only abnormal results are displayed) Labs Reviewed  COMPREHENSIVE METABOLIC PANEL - Abnormal; Notable  for the following:       Result Value   Chloride 119 (*)    CO2 9 (*)    Glucose, Bld 255 (*)    BUN 79 (*)    Creatinine, Ser 4.09 (*)    Calcium 7.9 (*)    Total Protein 6.2 (*)    AST 11 (*)    ALT 13 (*)    Alkaline Phosphatase 144 (*)    GFR calc non Af Amer 14 (*)    GFR calc Af Amer 16 (*)    All other components within normal limits  CBC WITH DIFFERENTIAL/PLATELET - Abnormal; Notable for the following:    RBC 3.84 (*)    Hemoglobin 11.1 (*)    HCT 33.8 (*)    All other components within normal limits  URINALYSIS, ROUTINE W REFLEX MICROSCOPIC    EKG  EKG Interpretation  Date/Time:  Monday October 28 2016 18:48:38 EDT Ventricular Rate:  100 PR Interval:    QRS Duration: 96 QT Interval:  393 QTC Calculation: 424 R Axis:   -5 Text Interpretation:  Sinus tachycardia Ventricular bigeminy Anteroseptal infarct, old Abnormal T, consider ischemia, lateral leads No STEMI.  Confirmed by Nanda Quinton [phone]) on 10/28/2016 7:37:30 PM       Radiology No results found.  Procedures Procedures (including critical care time)  Medications Ordered in ED Medications  diltiazem (CARDIZEM) 1 mg/mL load via infusion 10 mg (10 mg Intravenous Bolus from Bag 10/28/16 2020)    And  diltiazem (CARDIZEM) 100 mg in dextrose 5 % 100 mL (1 mg/mL) infusion (0 mg/hr Intravenous Stopped 10/28/16 2058)  0.9 %  sodium chloride infusion ( Intravenous Rate/Dose Change 10/28/16 2107)  sodium chloride 0.9 % bolus 1,000 mL (0 mLs Intravenous  Stopped 10/28/16 2041)  ondansetron (ZOFRAN) injection 4 mg (4 mg Intravenous Given 10/28/16 1915)     Initial Impression / Assessment and Plan / ED Course  I have reviewed the triage vital signs and the nursing notes.  Pertinent labs & imaging results that were available during my care of the patient were reviewed by me and considered in my medical decision making (see chart for details).     Patient presents to ED from outpatient clinic for request of IV fluids and lab work obtained. He has been experiencing nausea and vomiting for the past 3 days and associated generalized weakness. Patient has a history of renal transplant done approximately 2 years ago and history of diabetes. He denies any chest pain, trouble breathing, urinary symptoms, abdominal pain, syncope, blurry vision or other complaints at this time. He reports compliance with his other medications at home. Patient given IV fluids here as bolus and maintenance dose. Patient unable to give urine at this time. CMP revealed creatinine of 4.09 and BUN of 79. It appears that patient's creatinine baseline is around 1.6-1.8 according to his outpatient clinic visits although has been elevated in the past due to acute illness. CBC unremarkable at this time. Spoke to admitting team at [facility] who states that they would like him transferred to [facility] for further evaluation and management of his symptoms. No need for steroids at this time. Of note, patient had a episode of A. fib with RVR here in the ED. Initial EKG showed bigeminy but no A. fib. He was placed on Cardizem drip and reverted to normal sinus rhythm. Cardizem drip was discontinued after reverting to sinus tach. Spoke to Network engineer who states that ED providers at [facility] are ready for  patient's arrival.  Patient discussed with and seen by Dr. Laverta [place].  Final Clinical Impressions(s) / ED Diagnoses   Final diagnoses:  Dehydration  Kidney transplant recipient    New Prescriptions New  Prescriptions   No medications on file   I personally performed the services described in this documentation, which was scribed in my presence. The recorded information has been reviewed and is accurate.     Delia Heady, PA-C 10/29/16 0016    Margette Fast, MD 10/29/16 [phone]

## 2016-10-29 DIAGNOSIS — Z79899 Other long term (current) drug therapy: Secondary | ICD-10-CM | POA: Diagnosis not present

## 2016-10-29 DIAGNOSIS — E1122 Type 2 diabetes mellitus with diabetic chronic kidney disease: Secondary | ICD-10-CM | POA: Diagnosis present

## 2016-10-29 DIAGNOSIS — I129 Hypertensive chronic kidney disease with stage 1 through stage 4 chronic kidney disease, or unspecified chronic kidney disease: Secondary | ICD-10-CM | POA: Diagnosis present

## 2016-10-29 DIAGNOSIS — E876 Hypokalemia: Secondary | ICD-10-CM | POA: Diagnosis present

## 2016-10-29 DIAGNOSIS — Z85068 Personal history of other malignant neoplasm of small intestine: Secondary | ICD-10-CM | POA: Diagnosis not present

## 2016-10-29 DIAGNOSIS — Z89511 Acquired absence of right leg below knee: Secondary | ICD-10-CM | POA: Diagnosis not present

## 2016-10-29 DIAGNOSIS — N179 Acute kidney failure, unspecified: Secondary | ICD-10-CM | POA: Diagnosis present

## 2016-10-29 DIAGNOSIS — F329 Major depressive disorder, single episode, unspecified: Secondary | ICD-10-CM | POA: Diagnosis present

## 2016-10-29 DIAGNOSIS — Z940 Kidney transplant status: Secondary | ICD-10-CM | POA: Diagnosis not present

## 2016-10-29 DIAGNOSIS — N189 Chronic kidney disease, unspecified: Secondary | ICD-10-CM | POA: Diagnosis present

## 2016-10-29 DIAGNOSIS — N200 Calculus of kidney: Secondary | ICD-10-CM | POA: Diagnosis not present

## 2016-10-29 DIAGNOSIS — R05 Cough: Secondary | ICD-10-CM | POA: Diagnosis not present

## 2016-10-29 DIAGNOSIS — E1151 Type 2 diabetes mellitus with diabetic peripheral angiopathy without gangrene: Secondary | ICD-10-CM | POA: Diagnosis present

## 2016-10-29 DIAGNOSIS — I4891 Unspecified atrial fibrillation: Secondary | ICD-10-CM | POA: Diagnosis present

## 2016-10-29 DIAGNOSIS — Z7982 Long term (current) use of aspirin: Secondary | ICD-10-CM | POA: Diagnosis not present

## 2016-10-29 DIAGNOSIS — Z89512 Acquired absence of left leg below knee: Secondary | ICD-10-CM | POA: Diagnosis not present

## 2016-10-29 DIAGNOSIS — R000 Tachycardia, unspecified: Secondary | ICD-10-CM | POA: Diagnosis not present

## 2016-10-29 DIAGNOSIS — Z794 Long term (current) use of insulin: Secondary | ICD-10-CM | POA: Diagnosis not present

## 2016-10-29 DIAGNOSIS — F4320 Adjustment disorder, unspecified: Secondary | ICD-10-CM | POA: Diagnosis present

## 2016-10-29 DIAGNOSIS — E7800 Pure hypercholesterolemia, unspecified: Secondary | ICD-10-CM | POA: Diagnosis present

## 2016-10-29 DIAGNOSIS — I4892 Unspecified atrial flutter: Secondary | ICD-10-CM | POA: Diagnosis present

## 2016-10-29 DIAGNOSIS — E1110 Type 2 diabetes mellitus with ketoacidosis without coma: Secondary | ICD-10-CM | POA: Diagnosis present

## 2016-10-29 DIAGNOSIS — Z9221 Personal history of antineoplastic chemotherapy: Secondary | ICD-10-CM | POA: Diagnosis not present

## 2016-10-29 DIAGNOSIS — R112 Nausea with vomiting, unspecified: Secondary | ICD-10-CM | POA: Diagnosis not present

## 2016-10-29 DIAGNOSIS — E872 Acidosis: Secondary | ICD-10-CM | POA: Diagnosis not present

## 2016-11-05 DIAGNOSIS — R1110 Vomiting, unspecified: Secondary | ICD-10-CM | POA: Diagnosis not present

## 2016-11-05 DIAGNOSIS — Z8639 Personal history of other endocrine, nutritional and metabolic disease: Secondary | ICD-10-CM | POA: Diagnosis not present

## 2016-11-05 DIAGNOSIS — E1110 Type 2 diabetes mellitus with ketoacidosis without coma: Secondary | ICD-10-CM | POA: Diagnosis not present

## 2016-11-05 DIAGNOSIS — Z79899 Other long term (current) drug therapy: Secondary | ICD-10-CM | POA: Diagnosis not present

## 2016-11-05 DIAGNOSIS — Z4822 Encounter for aftercare following kidney transplant: Secondary | ICD-10-CM | POA: Diagnosis not present

## 2016-11-05 DIAGNOSIS — Z794 Long term (current) use of insulin: Secondary | ICD-10-CM | POA: Diagnosis not present

## 2016-11-05 DIAGNOSIS — N179 Acute kidney failure, unspecified: Secondary | ICD-10-CM | POA: Diagnosis not present

## 2016-11-05 DIAGNOSIS — Z940 Kidney transplant status: Secondary | ICD-10-CM | POA: Diagnosis not present

## 2016-11-05 DIAGNOSIS — E119 Type 2 diabetes mellitus without complications: Secondary | ICD-10-CM | POA: Diagnosis not present

## 2016-11-05 DIAGNOSIS — F329 Major depressive disorder, single episode, unspecified: Secondary | ICD-10-CM | POA: Diagnosis not present

## 2016-11-05 DIAGNOSIS — J302 Other seasonal allergic rhinitis: Secondary | ICD-10-CM | POA: Diagnosis not present

## 2016-11-05 DIAGNOSIS — R110 Nausea: Secondary | ICD-10-CM | POA: Diagnosis not present

## 2016-11-05 DIAGNOSIS — E0822 Diabetes mellitus due to underlying condition with diabetic chronic kidney disease: Secondary | ICD-10-CM | POA: Diagnosis not present

## 2016-11-12 DIAGNOSIS — I10 Essential (primary) hypertension: Secondary | ICD-10-CM | POA: Diagnosis not present

## 2016-11-12 DIAGNOSIS — E1122 Type 2 diabetes mellitus with diabetic chronic kidney disease: Secondary | ICD-10-CM | POA: Diagnosis not present

## 2016-11-12 DIAGNOSIS — Z940 Kidney transplant status: Secondary | ICD-10-CM | POA: Diagnosis not present

## 2016-11-22 DIAGNOSIS — F4325 Adjustment disorder with mixed disturbance of emotions and conduct: Secondary | ICD-10-CM | POA: Diagnosis not present

## 2016-11-22 DIAGNOSIS — R4189 Other symptoms and signs involving cognitive functions and awareness: Secondary | ICD-10-CM | POA: Diagnosis not present

## 2016-12-09 DIAGNOSIS — B259 Cytomegaloviral disease, unspecified: Secondary | ICD-10-CM | POA: Diagnosis not present

## 2016-12-09 DIAGNOSIS — Z940 Kidney transplant status: Secondary | ICD-10-CM | POA: Diagnosis not present

## 2016-12-09 DIAGNOSIS — B349 Viral infection, unspecified: Secondary | ICD-10-CM | POA: Diagnosis not present

## 2016-12-09 DIAGNOSIS — Z5181 Encounter for therapeutic drug level monitoring: Secondary | ICD-10-CM | POA: Diagnosis not present

## 2016-12-09 DIAGNOSIS — N390 Urinary tract infection, site not specified: Secondary | ICD-10-CM | POA: Diagnosis not present

## 2016-12-19 DIAGNOSIS — Z5181 Encounter for therapeutic drug level monitoring: Secondary | ICD-10-CM | POA: Diagnosis not present

## 2016-12-19 DIAGNOSIS — Z940 Kidney transplant status: Secondary | ICD-10-CM | POA: Diagnosis not present

## 2016-12-19 DIAGNOSIS — Z9483 Pancreas transplant status: Secondary | ICD-10-CM | POA: Diagnosis not present

## 2016-12-25 DIAGNOSIS — Z9483 Pancreas transplant status: Secondary | ICD-10-CM | POA: Diagnosis not present

## 2016-12-25 DIAGNOSIS — Z940 Kidney transplant status: Secondary | ICD-10-CM | POA: Diagnosis not present

## 2016-12-25 DIAGNOSIS — Z5181 Encounter for therapeutic drug level monitoring: Secondary | ICD-10-CM | POA: Diagnosis not present

## 2016-12-25 DIAGNOSIS — Z7901 Long term (current) use of anticoagulants: Secondary | ICD-10-CM | POA: Diagnosis not present

## 2016-12-27 DIAGNOSIS — N200 Calculus of kidney: Secondary | ICD-10-CM | POA: Diagnosis not present

## 2016-12-27 DIAGNOSIS — F4325 Adjustment disorder with mixed disturbance of emotions and conduct: Secondary | ICD-10-CM | POA: Diagnosis not present

## 2016-12-27 DIAGNOSIS — Z940 Kidney transplant status: Secondary | ICD-10-CM | POA: Diagnosis not present

## 2017-01-06 DIAGNOSIS — Z5181 Encounter for therapeutic drug level monitoring: Secondary | ICD-10-CM | POA: Diagnosis not present

## 2017-01-06 DIAGNOSIS — Z940 Kidney transplant status: Secondary | ICD-10-CM | POA: Diagnosis not present

## 2017-01-24 DIAGNOSIS — Z5181 Encounter for therapeutic drug level monitoring: Secondary | ICD-10-CM | POA: Diagnosis not present

## 2017-01-24 DIAGNOSIS — Z940 Kidney transplant status: Secondary | ICD-10-CM | POA: Diagnosis not present

## 2017-02-10 DIAGNOSIS — E1100 Type 2 diabetes mellitus with hyperosmolarity without nonketotic hyperglycemic-hyperosmolar coma (NKHHC): Secondary | ICD-10-CM | POA: Diagnosis not present

## 2017-03-06 DIAGNOSIS — E103291 Type 1 diabetes mellitus with mild nonproliferative diabetic retinopathy without macular edema, right eye: Secondary | ICD-10-CM | POA: Diagnosis not present

## 2017-03-06 DIAGNOSIS — D649 Anemia, unspecified: Secondary | ICD-10-CM | POA: Diagnosis not present

## 2017-03-06 DIAGNOSIS — E785 Hyperlipidemia, unspecified: Secondary | ICD-10-CM | POA: Diagnosis not present

## 2017-03-06 DIAGNOSIS — I129 Hypertensive chronic kidney disease with stage 1 through stage 4 chronic kidney disease, or unspecified chronic kidney disease: Secondary | ICD-10-CM | POA: Diagnosis not present

## 2017-03-06 DIAGNOSIS — E1129 Type 2 diabetes mellitus with other diabetic kidney complication: Secondary | ICD-10-CM | POA: Diagnosis not present

## 2017-03-06 DIAGNOSIS — E872 Acidosis: Secondary | ICD-10-CM | POA: Diagnosis not present

## 2017-03-06 DIAGNOSIS — Z961 Presence of intraocular lens: Secondary | ICD-10-CM | POA: Diagnosis not present

## 2017-03-06 DIAGNOSIS — Z940 Kidney transplant status: Secondary | ICD-10-CM | POA: Diagnosis not present

## 2017-03-06 DIAGNOSIS — R011 Cardiac murmur, unspecified: Secondary | ICD-10-CM | POA: Diagnosis not present

## 2017-03-06 DIAGNOSIS — I739 Peripheral vascular disease, unspecified: Secondary | ICD-10-CM | POA: Diagnosis not present

## 2017-03-06 DIAGNOSIS — N2581 Secondary hyperparathyroidism of renal origin: Secondary | ICD-10-CM | POA: Diagnosis not present

## 2017-03-06 DIAGNOSIS — Z23 Encounter for immunization: Secondary | ICD-10-CM | POA: Diagnosis not present

## 2017-03-06 DIAGNOSIS — E103212 Type 1 diabetes mellitus with mild nonproliferative diabetic retinopathy with macular edema, left eye: Secondary | ICD-10-CM | POA: Diagnosis not present

## 2017-03-13 ENCOUNTER — Other Ambulatory Visit (HOSPITAL_COMMUNITY): Payer: Self-pay | Admitting: *Deleted

## 2017-03-13 NOTE — Discharge Instructions (Signed)

## 2017-03-14 ENCOUNTER — Ambulatory Visit (HOSPITAL_COMMUNITY)
Admission: RE | Admit: 2017-03-14 | Discharge: 2017-03-14 | Disposition: A | Discharge status: Home / Self Care | Payer: Managed Care, Other (non HMO) | Source: Ambulatory Visit | Attending: Nephrology | Admitting: Nephrology

## 2017-03-14 DIAGNOSIS — Z5181 Encounter for therapeutic drug level monitoring: Secondary | ICD-10-CM | POA: Diagnosis not present

## 2017-03-14 DIAGNOSIS — D631 Anemia in chronic kidney disease: Secondary | ICD-10-CM | POA: Insufficient documentation

## 2017-03-14 DIAGNOSIS — Z940 Kidney transplant status: Secondary | ICD-10-CM | POA: Diagnosis not present

## 2017-03-14 MED ORDER — DARBEPOETIN ALFA 100 MCG/0.5ML IJ SOSY
PREFILLED_SYRINGE | INTRAMUSCULAR | Status: AC
  Administered 2017-03-14: 10:00:00 100 ug
  Filled 2017-03-14: qty 0.5

## 2017-03-14 MED ORDER — DARBEPOETIN ALFA 100 MCG/0.5ML IJ SOSY: 100.0000 ug | PREFILLED_SYRINGE | INTRAMUSCULAR | Status: DC

## 2017-03-17 LAB — POCT HEMOGLOBIN-HEMACUE: HEMOGLOBIN: 8.7 g/dL — AB (ref 13.0–17.0)

## 2017-04-02 DIAGNOSIS — T8619 Other complication of kidney transplant: Secondary | ICD-10-CM | POA: Diagnosis not present

## 2017-04-02 DIAGNOSIS — Z794 Long term (current) use of insulin: Secondary | ICD-10-CM | POA: Diagnosis not present

## 2017-04-02 DIAGNOSIS — Z940 Kidney transplant status: Secondary | ICD-10-CM | POA: Diagnosis not present

## 2017-04-02 DIAGNOSIS — N269 Renal sclerosis, unspecified: Secondary | ICD-10-CM | POA: Diagnosis not present

## 2017-04-02 DIAGNOSIS — Z992 Dependence on renal dialysis: Secondary | ICD-10-CM | POA: Diagnosis not present

## 2017-04-02 DIAGNOSIS — N261 Atrophy of kidney (terminal): Secondary | ICD-10-CM | POA: Diagnosis not present

## 2017-04-02 DIAGNOSIS — Z89521 Acquired absence of right knee: Secondary | ICD-10-CM | POA: Diagnosis not present

## 2017-04-02 DIAGNOSIS — E7800 Pure hypercholesterolemia, unspecified: Secondary | ICD-10-CM | POA: Diagnosis not present

## 2017-04-02 DIAGNOSIS — R7989 Other specified abnormal findings of blood chemistry: Secondary | ICD-10-CM | POA: Diagnosis not present

## 2017-04-02 DIAGNOSIS — Z89522 Acquired absence of left knee: Secondary | ICD-10-CM | POA: Diagnosis not present

## 2017-04-02 DIAGNOSIS — Z85038 Personal history of other malignant neoplasm of large intestine: Secondary | ICD-10-CM | POA: Diagnosis not present

## 2017-04-02 DIAGNOSIS — N179 Acute kidney failure, unspecified: Secondary | ICD-10-CM | POA: Diagnosis not present

## 2017-04-02 DIAGNOSIS — T8611 Kidney transplant rejection: Secondary | ICD-10-CM | POA: Diagnosis not present

## 2017-04-02 DIAGNOSIS — N200 Calculus of kidney: Secondary | ICD-10-CM | POA: Diagnosis not present

## 2017-04-02 DIAGNOSIS — I4891 Unspecified atrial fibrillation: Secondary | ICD-10-CM | POA: Diagnosis not present

## 2017-04-03 ENCOUNTER — Other Ambulatory Visit (HOSPITAL_COMMUNITY): Payer: Self-pay | Admitting: Nephrology

## 2017-04-03 DIAGNOSIS — R011 Cardiac murmur, unspecified: Secondary | ICD-10-CM

## 2017-04-03 DIAGNOSIS — Z89522 Acquired absence of left knee: Secondary | ICD-10-CM | POA: Diagnosis not present

## 2017-04-03 DIAGNOSIS — N179 Acute kidney failure, unspecified: Secondary | ICD-10-CM | POA: Diagnosis not present

## 2017-04-03 DIAGNOSIS — Z85038 Personal history of other malignant neoplasm of large intestine: Secondary | ICD-10-CM | POA: Diagnosis not present

## 2017-04-03 DIAGNOSIS — I4891 Unspecified atrial fibrillation: Secondary | ICD-10-CM | POA: Diagnosis not present

## 2017-04-03 DIAGNOSIS — N261 Atrophy of kidney (terminal): Secondary | ICD-10-CM | POA: Diagnosis not present

## 2017-04-03 DIAGNOSIS — T8619 Other complication of kidney transplant: Secondary | ICD-10-CM | POA: Diagnosis not present

## 2017-04-07 ENCOUNTER — Ambulatory Visit (HOSPITAL_COMMUNITY)
Admission: RE | Admit: 2017-04-07 | Discharge: 2017-04-07 | Disposition: A | Discharge status: Home / Self Care | Payer: Medicare Other | Source: Ambulatory Visit | Attending: Family Medicine | Admitting: Family Medicine

## 2017-04-07 DIAGNOSIS — I4891 Unspecified atrial fibrillation: Secondary | ICD-10-CM | POA: Diagnosis not present

## 2017-04-07 DIAGNOSIS — Z992 Dependence on renal dialysis: Secondary | ICD-10-CM | POA: Insufficient documentation

## 2017-04-07 DIAGNOSIS — I080 Rheumatic disorders of both mitral and aortic valves: Secondary | ICD-10-CM | POA: Insufficient documentation

## 2017-04-07 DIAGNOSIS — R011 Cardiac murmur, unspecified: Secondary | ICD-10-CM | POA: Diagnosis not present

## 2017-04-07 DIAGNOSIS — E1122 Type 2 diabetes mellitus with diabetic chronic kidney disease: Secondary | ICD-10-CM | POA: Diagnosis not present

## 2017-04-07 DIAGNOSIS — N186 End stage renal disease: Secondary | ICD-10-CM | POA: Insufficient documentation

## 2017-04-07 DIAGNOSIS — I1311 Hypertensive heart and chronic kidney disease without heart failure, with stage 5 chronic kidney disease, or end stage renal disease: Secondary | ICD-10-CM | POA: Insufficient documentation

## 2017-04-08 ENCOUNTER — Encounter (HOSPITAL_COMMUNITY): Payer: Managed Care, Other (non HMO)

## 2017-04-14 DIAGNOSIS — T8612 Kidney transplant failure: Secondary | ICD-10-CM | POA: Diagnosis present

## 2017-04-14 DIAGNOSIS — Z9114 Patient's other noncompliance with medication regimen: Secondary | ICD-10-CM | POA: Diagnosis not present

## 2017-04-14 DIAGNOSIS — E1129 Type 2 diabetes mellitus with other diabetic kidney complication: Secondary | ICD-10-CM | POA: Diagnosis not present

## 2017-04-14 DIAGNOSIS — T380X5A Adverse effect of glucocorticoids and synthetic analogues, initial encounter: Secondary | ICD-10-CM | POA: Diagnosis present

## 2017-04-14 DIAGNOSIS — D638 Anemia in other chronic diseases classified elsewhere: Secondary | ICD-10-CM | POA: Diagnosis present

## 2017-04-14 DIAGNOSIS — E1165 Type 2 diabetes mellitus with hyperglycemia: Secondary | ICD-10-CM | POA: Diagnosis present

## 2017-04-14 DIAGNOSIS — T8611 Kidney transplant rejection: Secondary | ICD-10-CM | POA: Diagnosis present

## 2017-04-14 DIAGNOSIS — E7800 Pure hypercholesterolemia, unspecified: Secondary | ICD-10-CM | POA: Diagnosis present

## 2017-04-14 DIAGNOSIS — Z940 Kidney transplant status: Secondary | ICD-10-CM | POA: Diagnosis not present

## 2017-04-14 DIAGNOSIS — N184 Chronic kidney disease, stage 4 (severe): Secondary | ICD-10-CM | POA: Diagnosis present

## 2017-04-14 DIAGNOSIS — R7989 Other specified abnormal findings of blood chemistry: Secondary | ICD-10-CM | POA: Diagnosis not present

## 2017-04-14 DIAGNOSIS — R739 Hyperglycemia, unspecified: Secondary | ICD-10-CM | POA: Diagnosis not present

## 2017-04-14 DIAGNOSIS — F4325 Adjustment disorder with mixed disturbance of emotions and conduct: Secondary | ICD-10-CM | POA: Diagnosis present

## 2017-04-14 DIAGNOSIS — Z89511 Acquired absence of right leg below knee: Secondary | ICD-10-CM | POA: Diagnosis not present

## 2017-04-14 DIAGNOSIS — K8681 Exocrine pancreatic insufficiency: Secondary | ICD-10-CM | POA: Diagnosis present

## 2017-04-14 DIAGNOSIS — E1151 Type 2 diabetes mellitus with diabetic peripheral angiopathy without gangrene: Secondary | ICD-10-CM | POA: Diagnosis present

## 2017-04-14 DIAGNOSIS — Z79899 Other long term (current) drug therapy: Secondary | ICD-10-CM | POA: Diagnosis not present

## 2017-04-14 DIAGNOSIS — Z89512 Acquired absence of left leg below knee: Secondary | ICD-10-CM | POA: Diagnosis not present

## 2017-04-14 DIAGNOSIS — Z794 Long term (current) use of insulin: Secondary | ICD-10-CM | POA: Diagnosis not present

## 2017-04-14 DIAGNOSIS — Z85068 Personal history of other malignant neoplasm of small intestine: Secondary | ICD-10-CM | POA: Diagnosis not present

## 2017-04-14 DIAGNOSIS — Z90411 Acquired partial absence of pancreas: Secondary | ICD-10-CM | POA: Diagnosis not present

## 2017-04-14 DIAGNOSIS — I10 Essential (primary) hypertension: Secondary | ICD-10-CM | POA: Diagnosis present

## 2017-04-14 DIAGNOSIS — E11649 Type 2 diabetes mellitus with hypoglycemia without coma: Secondary | ICD-10-CM | POA: Diagnosis not present

## 2017-04-14 DIAGNOSIS — Z298 Encounter for other specified prophylactic measures: Secondary | ICD-10-CM | POA: Diagnosis not present

## 2017-04-17 ENCOUNTER — Encounter (HOSPITAL_COMMUNITY): Payer: Managed Care, Other (non HMO)

## 2017-04-24 DIAGNOSIS — Z940 Kidney transplant status: Secondary | ICD-10-CM | POA: Diagnosis not present

## 2017-04-24 DIAGNOSIS — Z5181 Encounter for therapeutic drug level monitoring: Secondary | ICD-10-CM | POA: Diagnosis not present

## 2017-05-06 DIAGNOSIS — Z940 Kidney transplant status: Secondary | ICD-10-CM | POA: Diagnosis not present

## 2017-05-06 DIAGNOSIS — T8611 Kidney transplant rejection: Secondary | ICD-10-CM | POA: Diagnosis not present

## 2017-05-06 DIAGNOSIS — R197 Diarrhea, unspecified: Secondary | ICD-10-CM | POA: Diagnosis not present

## 2017-05-06 DIAGNOSIS — Z5181 Encounter for therapeutic drug level monitoring: Secondary | ICD-10-CM | POA: Diagnosis not present

## 2017-05-06 DIAGNOSIS — Z79899 Other long term (current) drug therapy: Secondary | ICD-10-CM | POA: Diagnosis not present

## 2017-05-06 DIAGNOSIS — I739 Peripheral vascular disease, unspecified: Secondary | ICD-10-CM | POA: Diagnosis not present

## 2017-05-21 DIAGNOSIS — Z5181 Encounter for therapeutic drug level monitoring: Secondary | ICD-10-CM | POA: Diagnosis not present

## 2017-05-21 DIAGNOSIS — B259 Cytomegaloviral disease, unspecified: Secondary | ICD-10-CM | POA: Diagnosis not present

## 2017-05-21 DIAGNOSIS — B349 Viral infection, unspecified: Secondary | ICD-10-CM | POA: Diagnosis not present

## 2017-05-21 DIAGNOSIS — Z940 Kidney transplant status: Secondary | ICD-10-CM | POA: Diagnosis not present

## 2017-06-02 DIAGNOSIS — F4325 Adjustment disorder with mixed disturbance of emotions and conduct: Secondary | ICD-10-CM | POA: Diagnosis not present

## 2017-06-02 DIAGNOSIS — Z940 Kidney transplant status: Secondary | ICD-10-CM | POA: Diagnosis not present

## 2017-06-02 DIAGNOSIS — F09 Unspecified mental disorder due to known physiological condition: Secondary | ICD-10-CM | POA: Diagnosis not present

## 2017-06-12 DIAGNOSIS — B349 Viral infection, unspecified: Secondary | ICD-10-CM | POA: Diagnosis not present

## 2017-06-12 DIAGNOSIS — Z5181 Encounter for therapeutic drug level monitoring: Secondary | ICD-10-CM | POA: Diagnosis not present

## 2017-06-12 DIAGNOSIS — B259 Cytomegaloviral disease, unspecified: Secondary | ICD-10-CM | POA: Diagnosis not present

## 2017-06-12 DIAGNOSIS — Z940 Kidney transplant status: Secondary | ICD-10-CM | POA: Diagnosis not present

## 2017-06-30 DIAGNOSIS — E1151 Type 2 diabetes mellitus with diabetic peripheral angiopathy without gangrene: Secondary | ICD-10-CM | POA: Diagnosis not present

## 2017-06-30 DIAGNOSIS — Z940 Kidney transplant status: Secondary | ICD-10-CM | POA: Diagnosis not present

## 2017-06-30 DIAGNOSIS — E1122 Type 2 diabetes mellitus with diabetic chronic kidney disease: Secondary | ICD-10-CM | POA: Diagnosis not present

## 2017-06-30 DIAGNOSIS — F329 Major depressive disorder, single episode, unspecified: Secondary | ICD-10-CM | POA: Diagnosis not present

## 2017-06-30 DIAGNOSIS — Z79899 Other long term (current) drug therapy: Secondary | ICD-10-CM | POA: Diagnosis not present

## 2017-06-30 DIAGNOSIS — Z89512 Acquired absence of left leg below knee: Secondary | ICD-10-CM | POA: Diagnosis not present

## 2017-06-30 DIAGNOSIS — T8611 Kidney transplant rejection: Secondary | ICD-10-CM | POA: Diagnosis not present

## 2017-06-30 DIAGNOSIS — Z4781 Encounter for orthopedic aftercare following surgical amputation: Secondary | ICD-10-CM | POA: Diagnosis not present

## 2017-06-30 DIAGNOSIS — N186 End stage renal disease: Secondary | ICD-10-CM | POA: Diagnosis not present

## 2017-06-30 DIAGNOSIS — Z794 Long term (current) use of insulin: Secondary | ICD-10-CM | POA: Diagnosis not present

## 2017-06-30 DIAGNOSIS — I739 Peripheral vascular disease, unspecified: Secondary | ICD-10-CM | POA: Diagnosis not present

## 2017-06-30 DIAGNOSIS — I120 Hypertensive chronic kidney disease with stage 5 chronic kidney disease or end stage renal disease: Secondary | ICD-10-CM | POA: Diagnosis not present

## 2017-06-30 DIAGNOSIS — N185 Chronic kidney disease, stage 5: Secondary | ICD-10-CM | POA: Diagnosis not present

## 2017-06-30 DIAGNOSIS — Z89511 Acquired absence of right leg below knee: Secondary | ICD-10-CM | POA: Diagnosis not present

## 2017-07-10 DIAGNOSIS — E113291 Type 2 diabetes mellitus with mild nonproliferative diabetic retinopathy without macular edema, right eye: Secondary | ICD-10-CM | POA: Diagnosis not present

## 2017-07-10 DIAGNOSIS — Z961 Presence of intraocular lens: Secondary | ICD-10-CM | POA: Diagnosis not present

## 2017-07-10 DIAGNOSIS — E113212 Type 2 diabetes mellitus with mild nonproliferative diabetic retinopathy with macular edema, left eye: Secondary | ICD-10-CM | POA: Diagnosis not present

## 2017-07-14 DIAGNOSIS — R112 Nausea with vomiting, unspecified: Secondary | ICD-10-CM | POA: Diagnosis not present

## 2017-07-14 DIAGNOSIS — E860 Dehydration: Secondary | ICD-10-CM | POA: Diagnosis not present

## 2017-07-16 DIAGNOSIS — Z940 Kidney transplant status: Secondary | ICD-10-CM | POA: Diagnosis not present

## 2017-07-16 DIAGNOSIS — Z5181 Encounter for therapeutic drug level monitoring: Secondary | ICD-10-CM | POA: Diagnosis not present

## 2017-08-04 DIAGNOSIS — B349 Viral infection, unspecified: Secondary | ICD-10-CM | POA: Diagnosis not present

## 2017-08-04 DIAGNOSIS — B259 Cytomegaloviral disease, unspecified: Secondary | ICD-10-CM | POA: Diagnosis not present

## 2017-08-04 DIAGNOSIS — Z940 Kidney transplant status: Secondary | ICD-10-CM | POA: Diagnosis not present

## 2017-08-04 DIAGNOSIS — Z5181 Encounter for therapeutic drug level monitoring: Secondary | ICD-10-CM | POA: Diagnosis not present

## 2017-08-22 DIAGNOSIS — B349 Viral infection, unspecified: Secondary | ICD-10-CM | POA: Diagnosis not present

## 2017-08-22 DIAGNOSIS — Z940 Kidney transplant status: Secondary | ICD-10-CM | POA: Diagnosis not present

## 2017-08-22 DIAGNOSIS — Z5181 Encounter for therapeutic drug level monitoring: Secondary | ICD-10-CM | POA: Diagnosis not present

## 2017-08-22 DIAGNOSIS — B259 Cytomegaloviral disease, unspecified: Secondary | ICD-10-CM | POA: Diagnosis not present

## 2017-09-08 DIAGNOSIS — E872 Acidosis: Secondary | ICD-10-CM | POA: Diagnosis not present

## 2017-09-08 DIAGNOSIS — I129 Hypertensive chronic kidney disease with stage 1 through stage 4 chronic kidney disease, or unspecified chronic kidney disease: Secondary | ICD-10-CM | POA: Diagnosis not present

## 2017-09-08 DIAGNOSIS — Z940 Kidney transplant status: Secondary | ICD-10-CM | POA: Diagnosis not present

## 2017-09-08 DIAGNOSIS — I739 Peripheral vascular disease, unspecified: Secondary | ICD-10-CM | POA: Diagnosis not present

## 2017-09-08 DIAGNOSIS — E1129 Type 2 diabetes mellitus with other diabetic kidney complication: Secondary | ICD-10-CM | POA: Diagnosis not present

## 2017-09-08 DIAGNOSIS — E785 Hyperlipidemia, unspecified: Secondary | ICD-10-CM | POA: Diagnosis not present

## 2017-09-08 DIAGNOSIS — N2581 Secondary hyperparathyroidism of renal origin: Secondary | ICD-10-CM | POA: Diagnosis not present

## 2017-09-08 DIAGNOSIS — R011 Cardiac murmur, unspecified: Secondary | ICD-10-CM | POA: Diagnosis not present

## 2017-09-08 DIAGNOSIS — D649 Anemia, unspecified: Secondary | ICD-10-CM | POA: Diagnosis not present

## 2017-09-22 ENCOUNTER — Encounter (HOSPITAL_COMMUNITY)
Admission: RE | Admit: 2017-09-22 | Discharge: 2017-09-22 | Disposition: A | Discharge status: Home / Self Care | Payer: Managed Care, Other (non HMO) | Source: Ambulatory Visit | Attending: Nephrology | Admitting: Nephrology

## 2017-09-22 VITALS — BP 163/62 | HR 68 | Temp 98.1°F | Resp 18

## 2017-09-22 DIAGNOSIS — D631 Anemia in chronic kidney disease: Secondary | ICD-10-CM | POA: Insufficient documentation

## 2017-09-22 DIAGNOSIS — Z940 Kidney transplant status: Secondary | ICD-10-CM | POA: Insufficient documentation

## 2017-09-22 DIAGNOSIS — N185 Chronic kidney disease, stage 5: Secondary | ICD-10-CM | POA: Diagnosis not present

## 2017-09-22 DIAGNOSIS — D638 Anemia in other chronic diseases classified elsewhere: Secondary | ICD-10-CM

## 2017-09-22 LAB — IRON AND TIBC
IRON: 75 ug/dL (ref 45–182)
Saturation Ratios: 33 % (ref 17.9–39.5)
TIBC: 227 ug/dL — AB (ref 250–450)
UIBC: 152 ug/dL

## 2017-09-22 LAB — FERRITIN: Ferritin: 462 ng/mL — ABNORMAL HIGH (ref 24–336)

## 2017-09-22 LAB — POCT HEMOGLOBIN-HEMACUE: Hemoglobin: 9.3 g/dL — ABNORMAL LOW (ref 13.0–17.0)

## 2017-09-22 MED ORDER — DARBEPOETIN ALFA 150 MCG/0.3ML IJ SOSY
150.0000 ug | PREFILLED_SYRINGE | INTRAMUSCULAR | Status: DC
  Administered 2017-09-22: 150 ug via SUBCUTANEOUS

## 2017-09-22 MED ORDER — DARBEPOETIN ALFA 150 MCG/0.3ML IJ SOSY
PREFILLED_SYRINGE | INTRAMUSCULAR | Status: AC
  Filled 2017-09-22: qty 0.3

## 2017-10-20 ENCOUNTER — Ambulatory Visit (HOSPITAL_COMMUNITY)
Admission: RE | Admit: 2017-10-20 | Discharge: 2017-10-20 | Disposition: A | Discharge status: Home / Self Care | Payer: Managed Care, Other (non HMO) | Source: Ambulatory Visit | Attending: Nephrology | Admitting: Nephrology

## 2017-10-20 VITALS — BP 148/70 | HR 73 | Temp 98.2°F | Resp 16

## 2017-10-20 DIAGNOSIS — D638 Anemia in other chronic diseases classified elsewhere: Secondary | ICD-10-CM

## 2017-10-20 DIAGNOSIS — D631 Anemia in chronic kidney disease: Secondary | ICD-10-CM | POA: Diagnosis not present

## 2017-10-20 DIAGNOSIS — N189 Chronic kidney disease, unspecified: Secondary | ICD-10-CM | POA: Diagnosis not present

## 2017-10-20 DIAGNOSIS — Z940 Kidney transplant status: Secondary | ICD-10-CM | POA: Insufficient documentation

## 2017-10-20 LAB — IRON AND TIBC
Iron: 112 ug/dL (ref 45–182)
SATURATION RATIOS: 49 % — AB (ref 17.9–39.5)
TIBC: 230 ug/dL — AB (ref 250–450)
UIBC: 118 ug/dL

## 2017-10-20 LAB — FERRITIN: FERRITIN: 483 ng/mL — AB (ref 24–336)

## 2017-10-20 LAB — POCT HEMOGLOBIN-HEMACUE: Hemoglobin: 10.8 g/dL — ABNORMAL LOW (ref 13.0–17.0)

## 2017-10-20 MED ORDER — DARBEPOETIN ALFA 150 MCG/0.3ML IJ SOSY
PREFILLED_SYRINGE | INTRAMUSCULAR | Status: AC
  Administered 2017-10-20: 150 ug via SUBCUTANEOUS
  Filled 2017-10-20: qty 0.3

## 2017-10-20 MED ORDER — DARBEPOETIN ALFA 150 MCG/0.3ML IJ SOSY
150.0000 ug | PREFILLED_SYRINGE | INTRAMUSCULAR | Status: DC
  Administered 2017-10-20: 150 ug via SUBCUTANEOUS

## 2017-10-21 DIAGNOSIS — Z5181 Encounter for therapeutic drug level monitoring: Secondary | ICD-10-CM | POA: Diagnosis not present

## 2017-10-21 DIAGNOSIS — Z0189 Encounter for other specified special examinations: Secondary | ICD-10-CM | POA: Diagnosis not present

## 2017-10-21 DIAGNOSIS — Z940 Kidney transplant status: Secondary | ICD-10-CM | POA: Diagnosis not present

## 2017-11-17 ENCOUNTER — Ambulatory Visit (HOSPITAL_COMMUNITY)
Admission: RE | Admit: 2017-11-17 | Discharge: 2017-11-17 | Disposition: A | Discharge status: Home / Self Care | Payer: Medicare Other | Source: Ambulatory Visit | Attending: Nephrology | Admitting: Nephrology

## 2017-11-17 VITALS — BP 180/76 | HR 72 | Resp 18

## 2017-11-17 DIAGNOSIS — Z940 Kidney transplant status: Secondary | ICD-10-CM | POA: Insufficient documentation

## 2017-11-17 DIAGNOSIS — D638 Anemia in other chronic diseases classified elsewhere: Secondary | ICD-10-CM

## 2017-11-17 DIAGNOSIS — D631 Anemia in chronic kidney disease: Secondary | ICD-10-CM | POA: Insufficient documentation

## 2017-11-17 LAB — POCT HEMOGLOBIN-HEMACUE: Hemoglobin: 11.2 g/dL — ABNORMAL LOW (ref 13.0–17.0)

## 2017-11-17 LAB — IRON AND TIBC
IRON: 94 ug/dL (ref 45–182)
SATURATION RATIOS: 40 % — AB (ref 17.9–39.5)
TIBC: 234 ug/dL — ABNORMAL LOW (ref 250–450)
UIBC: 140 ug/dL

## 2017-11-17 LAB — FERRITIN: FERRITIN: 507 ng/mL — AB (ref 24–336)

## 2017-11-17 MED ORDER — DARBEPOETIN ALFA 150 MCG/0.3ML IJ SOSY
PREFILLED_SYRINGE | INTRAMUSCULAR | Status: AC
  Administered 2017-11-17: 150 ug
  Filled 2017-11-17: qty 0.3

## 2017-11-17 MED ORDER — DARBEPOETIN ALFA 150 MCG/0.3ML IJ SOSY: 150.0000 ug | PREFILLED_SYRINGE | INTRAMUSCULAR | Status: DC

## 2017-11-21 DIAGNOSIS — Z5181 Encounter for therapeutic drug level monitoring: Secondary | ICD-10-CM | POA: Diagnosis not present

## 2017-11-21 DIAGNOSIS — Z940 Kidney transplant status: Secondary | ICD-10-CM | POA: Diagnosis not present

## 2017-12-15 ENCOUNTER — Encounter (HOSPITAL_COMMUNITY): Payer: Medicare Other

## 2017-12-19 ENCOUNTER — Ambulatory Visit (HOSPITAL_COMMUNITY)
Admission: RE | Admit: 2017-12-19 | Discharge: 2017-12-19 | Disposition: A | Discharge status: Home / Self Care | Payer: Medicare Other | Source: Ambulatory Visit | Attending: Nephrology | Admitting: Nephrology

## 2017-12-19 VITALS — BP 179/84 | HR 70 | Temp 98.3°F | Resp 18

## 2017-12-19 DIAGNOSIS — D638 Anemia in other chronic diseases classified elsewhere: Secondary | ICD-10-CM

## 2017-12-19 DIAGNOSIS — N189 Chronic kidney disease, unspecified: Secondary | ICD-10-CM | POA: Insufficient documentation

## 2017-12-19 DIAGNOSIS — D631 Anemia in chronic kidney disease: Secondary | ICD-10-CM | POA: Diagnosis not present

## 2017-12-19 DIAGNOSIS — Z940 Kidney transplant status: Secondary | ICD-10-CM | POA: Diagnosis not present

## 2017-12-19 LAB — IRON AND TIBC
Iron: 96 ug/dL (ref 45–182)
Saturation Ratios: 45 % — ABNORMAL HIGH (ref 17.9–39.5)
TIBC: 214 ug/dL — ABNORMAL LOW (ref 250–450)
UIBC: 118 ug/dL

## 2017-12-19 LAB — POCT HEMOGLOBIN-HEMACUE: Hemoglobin: 10.8 g/dL — ABNORMAL LOW (ref 13.0–17.0)

## 2017-12-19 LAB — FERRITIN: FERRITIN: 527 ng/mL — AB (ref 24–336)

## 2017-12-19 MED ORDER — DARBEPOETIN ALFA 150 MCG/0.3ML IJ SOSY
150.0000 ug | PREFILLED_SYRINGE | INTRAMUSCULAR | Status: DC
  Administered 2017-12-19: 150 ug via SUBCUTANEOUS

## 2018-01-16 ENCOUNTER — Ambulatory Visit (HOSPITAL_COMMUNITY)
Admission: RE | Admit: 2018-01-16 | Discharge: 2018-01-16 | Disposition: A | Discharge status: Home / Self Care | Payer: Managed Care, Other (non HMO) | Source: Ambulatory Visit | Attending: Nephrology | Admitting: Nephrology

## 2018-01-16 VITALS — BP 172/72 | HR 69 | Temp 97.9°F | Ht 72.0 in | Wt 190.0 lb

## 2018-01-16 DIAGNOSIS — E119 Type 2 diabetes mellitus without complications: Secondary | ICD-10-CM | POA: Insufficient documentation

## 2018-01-16 DIAGNOSIS — Z940 Kidney transplant status: Secondary | ICD-10-CM | POA: Diagnosis not present

## 2018-01-16 DIAGNOSIS — D638 Anemia in other chronic diseases classified elsewhere: Secondary | ICD-10-CM | POA: Diagnosis not present

## 2018-01-16 LAB — FERRITIN: FERRITIN: 549 ng/mL — AB (ref 24–336)

## 2018-01-16 LAB — IRON AND TIBC
IRON: 94 ug/dL (ref 45–182)
SATURATION RATIOS: 43 % — AB (ref 17.9–39.5)
TIBC: 220 ug/dL — AB (ref 250–450)
UIBC: 126 ug/dL

## 2018-01-16 LAB — POCT HEMOGLOBIN-HEMACUE: Hemoglobin: 9.4 g/dL — ABNORMAL LOW (ref 13.0–17.0)

## 2018-01-16 MED ORDER — DARBEPOETIN ALFA 150 MCG/0.3ML IJ SOSY
PREFILLED_SYRINGE | INTRAMUSCULAR | Status: AC
  Administered 2018-01-16: 150 ug via SUBCUTANEOUS
  Filled 2018-01-16: qty 0.3

## 2018-01-16 MED ORDER — DARBEPOETIN ALFA 150 MCG/0.3ML IJ SOSY
150.0000 ug | PREFILLED_SYRINGE | INTRAMUSCULAR | Status: DC
  Administered 2018-01-16: 150 ug via SUBCUTANEOUS

## 2018-02-13 ENCOUNTER — Encounter (HOSPITAL_COMMUNITY): Payer: Medicare Other

## 2018-02-18 ENCOUNTER — Ambulatory Visit (HOSPITAL_COMMUNITY)
Admission: RE | Admit: 2018-02-18 | Discharge: 2018-02-18 | Disposition: A | Discharge status: Home / Self Care | Payer: Managed Care, Other (non HMO) | Source: Ambulatory Visit | Attending: Nephrology | Admitting: Nephrology

## 2018-02-18 VITALS — BP 163/66 | HR 72 | Temp 98.4°F | Resp 20

## 2018-02-18 DIAGNOSIS — Z940 Kidney transplant status: Secondary | ICD-10-CM | POA: Insufficient documentation

## 2018-02-18 DIAGNOSIS — Z5181 Encounter for therapeutic drug level monitoring: Secondary | ICD-10-CM | POA: Insufficient documentation

## 2018-02-18 DIAGNOSIS — N189 Chronic kidney disease, unspecified: Secondary | ICD-10-CM | POA: Diagnosis not present

## 2018-02-18 DIAGNOSIS — D631 Anemia in chronic kidney disease: Secondary | ICD-10-CM | POA: Insufficient documentation

## 2018-02-18 DIAGNOSIS — Z79899 Other long term (current) drug therapy: Secondary | ICD-10-CM | POA: Insufficient documentation

## 2018-02-18 DIAGNOSIS — D638 Anemia in other chronic diseases classified elsewhere: Secondary | ICD-10-CM

## 2018-02-18 LAB — IRON AND TIBC
IRON: 75 ug/dL (ref 45–182)
Saturation Ratios: 33 % (ref 17.9–39.5)
TIBC: 225 ug/dL — ABNORMAL LOW (ref 250–450)
UIBC: 150 ug/dL

## 2018-02-18 LAB — FERRITIN: FERRITIN: 472 ng/mL — AB (ref 24–336)

## 2018-02-18 LAB — POCT HEMOGLOBIN-HEMACUE: Hemoglobin: 9.8 g/dL — ABNORMAL LOW (ref 13.0–17.0)

## 2018-02-18 MED ORDER — DARBEPOETIN ALFA 200 MCG/0.4ML IJ SOSY
200.0000 ug | PREFILLED_SYRINGE | INTRAMUSCULAR | Status: DC
  Administered 2018-02-18: 200 ug via SUBCUTANEOUS

## 2018-02-18 MED ORDER — DARBEPOETIN ALFA 200 MCG/0.4ML IJ SOSY
PREFILLED_SYRINGE | INTRAMUSCULAR | Status: AC
  Administered 2018-02-18: 200 ug via SUBCUTANEOUS
  Filled 2018-02-18: qty 0.4

## 2018-02-23 ENCOUNTER — Encounter (HOSPITAL_BASED_OUTPATIENT_CLINIC_OR_DEPARTMENT_OTHER): Payer: Medicare Other | Attending: Internal Medicine

## 2018-02-23 DIAGNOSIS — Z794 Long term (current) use of insulin: Secondary | ICD-10-CM | POA: Diagnosis not present

## 2018-02-23 DIAGNOSIS — E1151 Type 2 diabetes mellitus with diabetic peripheral angiopathy without gangrene: Secondary | ICD-10-CM | POA: Diagnosis not present

## 2018-02-23 DIAGNOSIS — Z89512 Acquired absence of left leg below knee: Secondary | ICD-10-CM | POA: Insufficient documentation

## 2018-02-23 DIAGNOSIS — I120 Hypertensive chronic kidney disease with stage 5 chronic kidney disease or end stage renal disease: Secondary | ICD-10-CM | POA: Insufficient documentation

## 2018-02-23 DIAGNOSIS — Z940 Kidney transplant status: Secondary | ICD-10-CM | POA: Insufficient documentation

## 2018-02-23 DIAGNOSIS — Z89511 Acquired absence of right leg below knee: Secondary | ICD-10-CM | POA: Diagnosis not present

## 2018-02-23 DIAGNOSIS — D631 Anemia in chronic kidney disease: Secondary | ICD-10-CM | POA: Diagnosis not present

## 2018-02-23 DIAGNOSIS — E11319 Type 2 diabetes mellitus with unspecified diabetic retinopathy without macular edema: Secondary | ICD-10-CM | POA: Insufficient documentation

## 2018-02-23 DIAGNOSIS — N186 End stage renal disease: Secondary | ICD-10-CM | POA: Insufficient documentation

## 2018-02-23 DIAGNOSIS — L97822 Non-pressure chronic ulcer of other part of left lower leg with fat layer exposed: Secondary | ICD-10-CM | POA: Insufficient documentation

## 2018-02-23 DIAGNOSIS — E1122 Type 2 diabetes mellitus with diabetic chronic kidney disease: Secondary | ICD-10-CM | POA: Insufficient documentation

## 2018-02-23 DIAGNOSIS — Z923 Personal history of irradiation: Secondary | ICD-10-CM | POA: Diagnosis not present

## 2018-02-23 DIAGNOSIS — Z9221 Personal history of antineoplastic chemotherapy: Secondary | ICD-10-CM | POA: Insufficient documentation

## 2018-02-23 DIAGNOSIS — E11622 Type 2 diabetes mellitus with other skin ulcer: Secondary | ICD-10-CM | POA: Diagnosis not present

## 2018-03-02 DIAGNOSIS — I120 Hypertensive chronic kidney disease with stage 5 chronic kidney disease or end stage renal disease: Secondary | ICD-10-CM | POA: Diagnosis not present

## 2018-03-02 DIAGNOSIS — E1122 Type 2 diabetes mellitus with diabetic chronic kidney disease: Secondary | ICD-10-CM | POA: Diagnosis not present

## 2018-03-02 DIAGNOSIS — L97822 Non-pressure chronic ulcer of other part of left lower leg with fat layer exposed: Secondary | ICD-10-CM | POA: Diagnosis not present

## 2018-03-02 DIAGNOSIS — E1151 Type 2 diabetes mellitus with diabetic peripheral angiopathy without gangrene: Secondary | ICD-10-CM | POA: Diagnosis not present

## 2018-03-02 DIAGNOSIS — E11622 Type 2 diabetes mellitus with other skin ulcer: Secondary | ICD-10-CM | POA: Diagnosis not present

## 2018-03-02 DIAGNOSIS — N186 End stage renal disease: Secondary | ICD-10-CM | POA: Diagnosis not present

## 2018-03-17 DIAGNOSIS — I120 Hypertensive chronic kidney disease with stage 5 chronic kidney disease or end stage renal disease: Secondary | ICD-10-CM | POA: Diagnosis not present

## 2018-03-17 DIAGNOSIS — E11622 Type 2 diabetes mellitus with other skin ulcer: Secondary | ICD-10-CM | POA: Diagnosis not present

## 2018-03-17 DIAGNOSIS — N186 End stage renal disease: Secondary | ICD-10-CM | POA: Diagnosis not present

## 2018-03-17 DIAGNOSIS — L97822 Non-pressure chronic ulcer of other part of left lower leg with fat layer exposed: Secondary | ICD-10-CM | POA: Diagnosis not present

## 2018-03-17 DIAGNOSIS — E1151 Type 2 diabetes mellitus with diabetic peripheral angiopathy without gangrene: Secondary | ICD-10-CM | POA: Diagnosis not present

## 2018-03-17 DIAGNOSIS — E1122 Type 2 diabetes mellitus with diabetic chronic kidney disease: Secondary | ICD-10-CM | POA: Diagnosis not present

## 2018-03-18 ENCOUNTER — Inpatient Hospital Stay (HOSPITAL_COMMUNITY): Admission: RE | Admit: 2018-03-18 | Payer: Medicare Other | Source: Ambulatory Visit

## 2018-03-18 DIAGNOSIS — Z940 Kidney transplant status: Secondary | ICD-10-CM | POA: Diagnosis not present

## 2018-03-18 DIAGNOSIS — N2581 Secondary hyperparathyroidism of renal origin: Secondary | ICD-10-CM | POA: Diagnosis not present

## 2018-03-18 DIAGNOSIS — E785 Hyperlipidemia, unspecified: Secondary | ICD-10-CM | POA: Diagnosis not present

## 2018-03-18 DIAGNOSIS — R011 Cardiac murmur, unspecified: Secondary | ICD-10-CM | POA: Diagnosis not present

## 2018-03-18 DIAGNOSIS — Z23 Encounter for immunization: Secondary | ICD-10-CM | POA: Diagnosis not present

## 2018-03-18 DIAGNOSIS — I739 Peripheral vascular disease, unspecified: Secondary | ICD-10-CM | POA: Diagnosis not present

## 2018-03-18 DIAGNOSIS — D631 Anemia in chronic kidney disease: Secondary | ICD-10-CM | POA: Diagnosis not present

## 2018-03-18 DIAGNOSIS — E1129 Type 2 diabetes mellitus with other diabetic kidney complication: Secondary | ICD-10-CM | POA: Diagnosis not present

## 2018-03-18 DIAGNOSIS — E872 Acidosis: Secondary | ICD-10-CM | POA: Diagnosis not present

## 2018-03-18 DIAGNOSIS — N189 Chronic kidney disease, unspecified: Secondary | ICD-10-CM | POA: Diagnosis not present

## 2018-03-18 DIAGNOSIS — I129 Hypertensive chronic kidney disease with stage 1 through stage 4 chronic kidney disease, or unspecified chronic kidney disease: Secondary | ICD-10-CM | POA: Diagnosis not present

## 2018-04-15 ENCOUNTER — Encounter (HOSPITAL_COMMUNITY): Payer: Medicare Other

## 2018-05-23 ENCOUNTER — Encounter (HOSPITAL_COMMUNITY): Payer: Self-pay

## 2018-05-23 ENCOUNTER — Observation Stay (HOSPITAL_COMMUNITY)
Admission: EM | Admit: 2018-05-23 | Discharge: 2018-05-25 | Disposition: A | Discharge status: Home Health | Payer: Managed Care, Other (non HMO) | Attending: Internal Medicine | Admitting: Internal Medicine

## 2018-05-23 ENCOUNTER — Other Ambulatory Visit: Payer: Self-pay

## 2018-05-23 ENCOUNTER — Emergency Department (HOSPITAL_COMMUNITY): Payer: Managed Care, Other (non HMO)

## 2018-05-23 DIAGNOSIS — Z89512 Acquired absence of left leg below knee: Secondary | ICD-10-CM

## 2018-05-23 DIAGNOSIS — K219 Gastro-esophageal reflux disease without esophagitis: Secondary | ICD-10-CM | POA: Diagnosis present

## 2018-05-23 DIAGNOSIS — N185 Chronic kidney disease, stage 5: Secondary | ICD-10-CM | POA: Diagnosis present

## 2018-05-23 DIAGNOSIS — R011 Cardiac murmur, unspecified: Secondary | ICD-10-CM | POA: Insufficient documentation

## 2018-05-23 DIAGNOSIS — E860 Dehydration: Secondary | ICD-10-CM | POA: Diagnosis not present

## 2018-05-23 DIAGNOSIS — Z992 Dependence on renal dialysis: Secondary | ICD-10-CM | POA: Insufficient documentation

## 2018-05-23 DIAGNOSIS — Z794 Long term (current) use of insulin: Secondary | ICD-10-CM | POA: Diagnosis not present

## 2018-05-23 DIAGNOSIS — R000 Tachycardia, unspecified: Secondary | ICD-10-CM | POA: Diagnosis not present

## 2018-05-23 DIAGNOSIS — I120 Hypertensive chronic kidney disease with stage 5 chronic kidney disease or end stage renal disease: Secondary | ICD-10-CM | POA: Diagnosis not present

## 2018-05-23 DIAGNOSIS — E785 Hyperlipidemia, unspecified: Secondary | ICD-10-CM | POA: Diagnosis not present

## 2018-05-23 DIAGNOSIS — J9601 Acute respiratory failure with hypoxia: Principal | ICD-10-CM | POA: Diagnosis present

## 2018-05-23 DIAGNOSIS — D638 Anemia in other chronic diseases classified elsewhere: Secondary | ICD-10-CM

## 2018-05-23 DIAGNOSIS — Z23 Encounter for immunization: Secondary | ICD-10-CM | POA: Insufficient documentation

## 2018-05-23 DIAGNOSIS — D649 Anemia, unspecified: Secondary | ICD-10-CM | POA: Diagnosis present

## 2018-05-23 DIAGNOSIS — E872 Acidosis, unspecified: Secondary | ICD-10-CM | POA: Diagnosis present

## 2018-05-23 DIAGNOSIS — Z940 Kidney transplant status: Secondary | ICD-10-CM | POA: Diagnosis not present

## 2018-05-23 DIAGNOSIS — E119 Type 2 diabetes mellitus without complications: Secondary | ICD-10-CM

## 2018-05-23 DIAGNOSIS — Z8249 Family history of ischemic heart disease and other diseases of the circulatory system: Secondary | ICD-10-CM | POA: Diagnosis not present

## 2018-05-23 DIAGNOSIS — E7800 Pure hypercholesterolemia, unspecified: Secondary | ICD-10-CM | POA: Insufficient documentation

## 2018-05-23 DIAGNOSIS — E1140 Type 2 diabetes mellitus with diabetic neuropathy, unspecified: Secondary | ICD-10-CM | POA: Diagnosis not present

## 2018-05-23 DIAGNOSIS — E1122 Type 2 diabetes mellitus with diabetic chronic kidney disease: Secondary | ICD-10-CM | POA: Diagnosis not present

## 2018-05-23 DIAGNOSIS — I480 Paroxysmal atrial fibrillation: Secondary | ICD-10-CM | POA: Diagnosis not present

## 2018-05-23 DIAGNOSIS — R0609 Other forms of dyspnea: Secondary | ICD-10-CM

## 2018-05-23 DIAGNOSIS — Z89511 Acquired absence of right leg below knee: Secondary | ICD-10-CM

## 2018-05-23 DIAGNOSIS — Z7982 Long term (current) use of aspirin: Secondary | ICD-10-CM | POA: Diagnosis not present

## 2018-05-23 DIAGNOSIS — N186 End stage renal disease: Secondary | ICD-10-CM | POA: Insufficient documentation

## 2018-05-23 DIAGNOSIS — R110 Nausea: Secondary | ICD-10-CM | POA: Diagnosis not present

## 2018-05-23 DIAGNOSIS — R0902 Hypoxemia: Secondary | ICD-10-CM | POA: Diagnosis not present

## 2018-05-23 DIAGNOSIS — Z7952 Long term (current) use of systemic steroids: Secondary | ICD-10-CM | POA: Insufficient documentation

## 2018-05-23 DIAGNOSIS — M47814 Spondylosis without myelopathy or radiculopathy, thoracic region: Secondary | ICD-10-CM | POA: Insufficient documentation

## 2018-05-23 DIAGNOSIS — R0602 Shortness of breath: Secondary | ICD-10-CM | POA: Diagnosis not present

## 2018-05-23 DIAGNOSIS — R0600 Dyspnea, unspecified: Secondary | ICD-10-CM

## 2018-05-23 DIAGNOSIS — I700 Atherosclerosis of aorta: Secondary | ICD-10-CM | POA: Insufficient documentation

## 2018-05-23 DIAGNOSIS — F329 Major depressive disorder, single episode, unspecified: Secondary | ICD-10-CM | POA: Diagnosis not present

## 2018-05-23 DIAGNOSIS — I10 Essential (primary) hypertension: Secondary | ICD-10-CM | POA: Diagnosis not present

## 2018-05-23 DIAGNOSIS — L8992 Pressure ulcer of unspecified site, stage 2: Secondary | ICD-10-CM

## 2018-05-23 DIAGNOSIS — R197 Diarrhea, unspecified: Secondary | ICD-10-CM | POA: Diagnosis not present

## 2018-05-23 DIAGNOSIS — R5383 Other fatigue: Secondary | ICD-10-CM | POA: Diagnosis not present

## 2018-05-23 DIAGNOSIS — R531 Weakness: Secondary | ICD-10-CM | POA: Diagnosis not present

## 2018-05-23 DIAGNOSIS — I491 Atrial premature depolarization: Secondary | ICD-10-CM | POA: Insufficient documentation

## 2018-05-23 DIAGNOSIS — I4891 Unspecified atrial fibrillation: Secondary | ICD-10-CM | POA: Diagnosis present

## 2018-05-23 DIAGNOSIS — L8990 Pressure ulcer of unspecified site, unspecified stage: Secondary | ICD-10-CM

## 2018-05-23 DIAGNOSIS — Z79899 Other long term (current) drug therapy: Secondary | ICD-10-CM | POA: Diagnosis not present

## 2018-05-23 DIAGNOSIS — R112 Nausea with vomiting, unspecified: Secondary | ICD-10-CM | POA: Diagnosis not present

## 2018-05-23 LAB — I-STAT TROPONIN, ED: Troponin i, poc: 0.02 ng/mL (ref 0.00–0.08)

## 2018-05-23 LAB — CBC
HEMATOCRIT: 34 % — AB (ref 39.0–52.0)
HEMOGLOBIN: 10.6 g/dL — AB (ref 13.0–17.0)
MCH: 28 pg (ref 26.0–34.0)
MCHC: 31.2 g/dL (ref 30.0–36.0)
MCV: 89.7 fL (ref 80.0–100.0)
Platelets: 232 10*3/uL (ref 150–400)
RBC: 3.79 MIL/uL — ABNORMAL LOW (ref 4.22–5.81)
RDW: 14.6 % (ref 11.5–15.5)
WBC: 13.5 10*3/uL — AB (ref 4.0–10.5)
nRBC: 0.2 % (ref 0.0–0.2)

## 2018-05-23 LAB — URINALYSIS, ROUTINE W REFLEX MICROSCOPIC
BILIRUBIN URINE: NEGATIVE
GLUCOSE, UA: 50 mg/dL — AB
Ketones, ur: NEGATIVE mg/dL
Leukocytes, UA: NEGATIVE
Nitrite: NEGATIVE
PH: 5 (ref 5.0–8.0)
PROTEIN: 30 mg/dL — AB
Specific Gravity, Urine: 1.011 (ref 1.005–1.030)

## 2018-05-23 LAB — BASIC METABOLIC PANEL
BUN: 85 mg/dL — ABNORMAL HIGH (ref 8–23)
CO2: <7 mmol/L — ABNORMAL LOW (ref 22–32)
CREATININE: 4.72 mg/dL — AB (ref 0.61–1.24)
Calcium: 8.2 mg/dL — ABNORMAL LOW (ref 8.9–10.3)
Chloride: 117 mmol/L — ABNORMAL HIGH (ref 98–111)
GFR, EST AFRICAN AMERICAN: 14 mL/min — AB (ref >60)
GFR, EST NON AFRICAN AMERICAN: 12 mL/min — AB (ref >60)
GLUCOSE: 262 mg/dL — AB (ref 70–99)
Potassium: 5 mmol/L (ref 3.5–5.1)
Sodium: 135 mmol/L (ref 135–145)

## 2018-05-23 LAB — I-STAT CG4 LACTIC ACID, ED: LACTIC ACID, VENOUS: 1.14 mmol/L (ref 0.5–1.9)

## 2018-05-23 LAB — INFLUENZA PANEL BY PCR (TYPE A & B)
Influenza A By PCR: NEGATIVE
Influenza B By PCR: NEGATIVE

## 2018-05-23 MED ORDER — SODIUM CHLORIDE 0.9 % IV BOLUS
1000.0000 mL | Freq: Once | INTRAVENOUS | Status: AC
  Administered 2018-05-23: 1000 mL via INTRAVENOUS

## 2018-05-23 MED ORDER — IPRATROPIUM-ALBUTEROL 0.5-2.5 (3) MG/3ML IN SOLN
3.0000 mL | Freq: Once | RESPIRATORY_TRACT | Status: AC
  Administered 2018-05-23: 3 mL via RESPIRATORY_TRACT
  Filled 2018-05-23: qty 3

## 2018-05-23 MED ORDER — ONDANSETRON 4 MG PO TBDP
4.0000 mg | ORAL_TABLET | Freq: Once | ORAL | Status: AC
  Administered 2018-05-23: 4 mg via ORAL
  Filled 2018-05-23: qty 1

## 2018-05-23 MED ORDER — INSULIN ASPART 100 UNIT/ML ~~LOC~~ SOLN
0.0000 [IU] | SUBCUTANEOUS | Status: DC
  Administered 2018-05-24: 3 [IU] via SUBCUTANEOUS

## 2018-05-23 NOTE — ED Triage Notes (Signed)
Patient arrived by [facility] with complaint of fatigue and exertional SOB. Denies pain, alert and oriented. Seen at [facility] walk in on Monday with no diagnosis. Patient appears weak on arrival

## 2018-05-23 NOTE — ED Notes (Signed)
ED Provider at bedside. 

## 2018-05-23 NOTE — H&P (Signed)
Paul [name] MHD:[id number] DOB: 06-22-1955 DOA: 05/23/2018     PCP: [name], MD   Outpatient Specialists:   CARDS:   Dr. Marena Chancy who NEphrology:  locally Jamal Maes  At  [facility] and Rael[facility] at [facility]  Patient arrived to ER on 05/23/18 at 1454  Patient coming from: home Lives With family    Chief Complaint:  Chief Complaint  Patient presents with  . Shortness of Breath  . Weakness    HPI: Paul [name] is a 63 y.o. male with medical history significant of kidney transplant on Immunosuppression, bilateral BKA, Type 2 diabetes mellitus   Essential hypertension, Anemia,    Presented with   Exertional dyspnea, no chest pain.  MS was called secondary to significant fatigue and exertional shortness of breath Was seen in [place] walk-in clinic 5 days ago he was started on antibiotic and Nasal spray. He is not sure what.  He has been getting weaker progressively since then not eating  drinking ice water Reports dry cough runny nose No hx of Asthma as a child no hx of tobacco abuse This been getting worse progressively  Regarding pertinent Chronic problems: hx of kidney transplant Followed at [facility] hx of DM with neuropathy, retinopathy, nephropathy, b/l LE amputation, complicated by ESRD s/p DDKT on 05/23/2014 (KDPI 63%, steroid induction, w post txp course with DGF, ACR, UTI, depression, with bx on 03/2017 demonstrating chronic IFTA and chronic AMR treated with steroids and IVIG, now with CKD Stage IV complicated by anemia on ESA  Hx of A.fib now only on Aspirin he is unsure why blood thinner was stopped Have not had any trouble with it for the past 10 years  On Tacrolimus 4/3 last dose 9 PM , MMF 360/360, Prednisone 5 mg     While in ER: 88%  In ER  The following Work up has been ordered so far:  Orders Placed This Encounter  Procedures  . Urine culture  . DG Chest 2 View  . Basic metabolic panel  . CBC  . Urinalysis, Routine w reflex  microscopic  . Influenza panel by PCR (type A & B)  . Cardiac monitoring  . Saline Lock IV, Maintain IV access  . Check Pulse Oximetry while ambulating  . Consult to hospitalist  . Pulse oximetry, continuous  . I-Stat Troponin, ED (not at [facility])  . ED EKG  . EKG 12-Lead     Following Medications were ordered in ER: Medications  sodium chloride 0.9 % bolus 1,000 mL (1,000 mLs Intravenous New Bag/Given 05/23/18 2208)  ondansetron (ZOFRAN-ODT) disintegrating tablet 4 mg (4 mg Oral Given 05/23/18 1719)  ipratropium-albuterol (DUONEB) 0.5-2.5 (3) MG/3ML nebulizer solution 3 mL (3 mLs Nebulization Given 05/23/18 1906)  ipratropium-albuterol (DUONEB) 0.5-2.5 (3) MG/3ML nebulizer solution 3 mL (3 mLs Nebulization Given 05/23/18 2155)    Significant initial  Findings: Abnormal Labs Reviewed  BASIC METABOLIC PANEL - Abnormal; Notable for the following components:      Result Value   Chloride 117 (*)    CO2 <7 (*)    Glucose, Bld 262 (*)    BUN 85 (*)    Creatinine, Ser 4.72 (*)    Calcium 8.2 (*)    GFR calc non Af Amer 12 (*)    GFR calc Af Amer 14 (*)    All other components within normal limits  CBC - Abnormal; Notable for the following components:   WBC 13.5 (*)    RBC  3.79 (*)    Hemoglobin 10.6 (*)    HCT 34.0 (*)    All other components within normal limits  URINALYSIS, ROUTINE W REFLEX MICROSCOPIC - Abnormal; Notable for the following components:   Glucose, UA 50 (*)    Hgb urine dipstick MODERATE (*)    Protein, ur 30 (*)    Bacteria, UA RARE (*)    All other components within normal limits   Flu neg Hypoxic with exertion   Lactic Acid, Venous    Component Value Date/Time   LATICACIDVEN 1.14 05/23/2018 1906   Lactic acid 1.14 Trop 0.02  Na 135 K 5.0  Cr  Up from baseline see below Lab Results  Component Value Date   CREATININE 4.72 (H) 05/23/2018   CREATININE 4.09 (H) 10/28/2016   CREATININE 5.23 (H) 08/02/2014      WBC 13.5   HG/HCT  Up from baseline see  below    Component Value Date/Time   HGB 10.6 (L) 05/23/2018 1517   HGB 11.3 (L) 07/07/2012 0910   HCT 34.0 (L) 05/23/2018 1517   HCT 34.7 (L) 07/07/2012 0910     Troponin (Point of Care Test) Recent Labs    05/23/18 1904  TROPIPOC 0.02     BNP (last 3 results) No results for input(s): BNP in the last 8760 hours.  ProBNP (last 3 results) No results for input(s): PROBNP in the last 8760 hours.    UA   no evidence of UTI    CXR -  NON acute   ECG:  Personally reviewed by me showing: HR : 92 Rhythm:  NSR,   no evidence of ischemic changes QTC 447     ED Triage Vitals  Enc Vitals Group     BP 05/23/18 1514 (!) 145/76     Pulse Rate 05/23/18 1514 91     Resp 05/23/18 1514 16     Temp 05/23/18 1514 98.1 F (36.7 C)     Temp Source 05/23/18 1514 Oral     SpO2 05/23/18 1514 98 %     Weight --      Height --      Head Circumference --      Peak Flow --      Pain Score 05/23/18 1509 0     Pain Loc --      Pain Edu? --      Excl. in [place]? --   TMAX(24)@       Latest  Blood pressure (!) 162/75, pulse (!) 111, temperature 98.2 F (36.8 C), temperature source Oral, resp. rate 17, SpO2 100 %.   Hospitalist was called for admission for acute on chronic acute respiratory failure with hypoxia   Review of Systems:    Pertinent positives include:  Fatigue, shortness of breath at rest.   dyspnea on exertion  Constitutional:  No weight loss, night sweats, Fevers, chills weight loss  HEENT:  No headaches, Difficulty swallowing,Tooth/dental problems,Sore throat,  No sneezing, itching, ear ache, nasal congestion, post nasal drip,  Cardio-vascular:  No chest pain, Orthopnea, PND, anasarca, dizziness, palpitations.no Bilateral lower extremity swelling  GI:  No heartburn, indigestion, abdominal pain, nausea, vomiting, diarrhea, change in bowel habits, loss of appetite, melena, blood in stool, hematemesis Resp:  no , No excess mucus, no productive cough, No non-productive  cough, No coughing up of blood.No change in color of mucus.No wheezing. Skin:  no rash or lesions. No jaundice GU:  no dysuria, change in color of urine, no urgency  or frequency. No straining to urinate.  No flank pain.  Musculoskeletal:  No joint pain or no joint swelling. No decreased range of motion. No back pain.  Psych:  No change in mood or affect. No depression or anxiety. No memory loss.  Neuro: no localizing neurological complaints, no tingling, no weakness, no double vision, no gait abnormality, no slurred speech, no confusion  All systems reviewed and apart from [place] all are negative  Past Medical History:   Past Medical History:  Diagnosis Date  . A-fib ([place])   . Anemia    esrd  . Calciphylaxis 05/2013   lower ext  . Cancer ([place])    hx duodenal adenoCa 2002, resected  . Closed left arm fracture 1967  . Corneal abrasion, left   . Depression   . Diabetes mellitus    Type 1 iddm x 11 yrs  . Diabetic neuropathy ([place])   . ESRD on hemodialysis ([place])    Pt has ESRD due to DM.  He had a L upper arm AVF prior to starting HD which was ligated due to L arm steal syndrome.  He started HD in Jan 2014 and did home HD.  The family couldn't cannulate the R arm AVF successfully so by mid 2014 they decided to switch to PD which was done late summer 2014.  In Jan 2015 he was admitted with FTT felt to be due to underdialysis on PD and PD was abandoned and he   . GERD (gastroesophageal reflux disease)   . Glomerulosclerosis, diabetic ([place])   . Heart murmur   . History of blood transfusion   . Hypertension    off of meds due to orthostatic hypotension  . Kidney transplant recipient   . Open wound of both legs with complication    Pt has had progressive wounds of both LE's including gangrene of the toes and patchy necrosis of the calves.  He has been treated at [facility] by Dr Jerline Pain with hyperbaric O2 5d per week.  He is getting Na Thiosulfate with HD for suspected calciphylaxis. Pt  says doctor's aren't sure if these ulcers were diabetic ulcers or calciphylaxis.  He underwent bilat BKA on 07/30/13.  He says that the woun     Past Surgical History:  Procedure Laterality Date  . AMPUTATION Bilateral 07/30/2013   Procedure: AMPUTATION BELOW KNEE;  Surgeon: Newt Minion, MD;  Location: [place];  Service: Orthopedics;  Laterality: Bilateral;  Bilateral Below Knee Amputations  . AMPUTATION Bilateral 09/03/2013   Procedure: AMPUTATION BELOW KNEE;  Surgeon: Newt Minion, MD;  Location: [place];  Service: Orthopedics;  Laterality: Bilateral;  Bilateral Below Knee Amputation Revision  . AV FISTULA PLACEMENT  06/07/2011   Procedure: ARTERIOVENOUS (AV) FISTULA CREATION;  Surgeon: Angelia Mould, MD;  Location: [place];  Service: Vascular;  Laterality: Left;  . AV FISTULA PLACEMENT  06/18/2012   Procedure: ARTERIOVENOUS (AV) FISTULA CREATION;  Surgeon: Mal Misty, MD;  Location: [place];  Service: Vascular;  Laterality: Right;  [place]  . BILIARY DIVERSION, EXTERNAL  2002  . BONE MARROW BIOPSY  2012  . CAPD INSERTION N/A 01/14/2013   Procedure: LAPAROSCOPIC INSERTION CONTINUOUS AMBULATORY PERITONEAL DIALYSIS  (CAPD) CATHETER;  Surgeon: Adin Hector, MD;  Location: [place];  Service: General;  Laterality: N/A;  . CAPD REMOVAL N/A 07/09/2013   Procedure: CONTINUOUS AMBULATORY PERITONEAL DIALYSIS  (CAPD) CATHETER REMOVAL;  Surgeon: Adin Hector, MD;  Location: [place];  Service: General;  Laterality: N/A;  . COLONOSCOPY     Hx: of  . INSERTION OF DIALYSIS CATHETER  05/22/2012   Procedure: INSERTION OF DIALYSIS CATHETER;  Surgeon: Mal Misty, MD;  Location: [place];  Service: Vascular;  Laterality: N/A;  right internal jugular vein  . LIGATION OF ARTERIOVENOUS  FISTULA  05/22/2012   Procedure: LIGATION OF ARTERIOVENOUS  FISTULA;  Surgeon: Mal Misty, MD;  Location: [place];  Service: Vascular;  Laterality: Left;  Left brachio-cephalic fistula  . LOWER EXTREMITY ANGIOGRAM N/A 06/14/2013    Procedure: LOWER EXTREMITY ANGIOGRAM;  Surgeon: Angelia Mould, MD;  Location: [facility] CATH LAB;  Service: Cardiovascular;  Laterality: N/A;  . NEPHRECTOMY TRANSPLANTED ORGAN    . OTHER SURGICAL HISTORY     Cyst removed from back  . PORTACATH PLACEMENT  2002  . REMOVAL OF A DIALYSIS CATHETER    . RENAL BIOPSY, PERCUTANEOUS  2012  . TONSILLECTOMY    . VASCULAR SURGERY    . WHIPPLE PROCEDURE  2002   duodenal ca, Dr. Harlow Asa    Social History:  Ambulatory  independently  With prosthesis     reports that he has never smoked. He has never used smokeless tobacco. He reports that he does not drink alcohol or use drugs.     Family History:   Family History  Problem Relation Age of Onset  . Cancer Mother   . Cancer Father        prostate cancer  . Heart disease Maternal Grandfather   . Stroke Paternal Grandmother     Allergies: Allergies  Allergen Reactions  . Metformin And Related Other (See Comments)    Kidney problems     Prior to Admission medications   Medication Sig Start Date End Date Taking? Authorizing Provider  amLODipine (NORVASC) 10 MG tablet Take 10 mg by mouth daily.    [provider]  aspirin EC 81 MG tablet Take 81 mg by mouth daily.    [provider]  atorvastatin (LIPITOR) 10 MG tablet Take 1 tablet (10 mg total) by mouth daily. 08/13/13   Angiulli, Lavon Paganini, PA-C  cholecalciferol (VITAMIN D) 1000 UNITS tablet Take 1,000 Units by mouth daily.    [provider]  insulin glargine (LANTUS) 100 UNIT/ML injection Inject 12 Units into the skin 2 (two) times daily.     [provider]  insulin lispro (HUMALOG) 100 UNIT/ML injection Inject 6 Units into the skin 3 (three) times daily before meals.     [provider]  magnesium chloride (SLOW-MAG) 64 MG TBEC SR tablet Take by mouth. 08/26/14   [provider]  mycophenolate (MYFORTIC) 180 MG EC tablet Take 180 mg by mouth 2 (two) times daily.    [provider]  omeprazole (PRILOSEC) 40 MG capsule  01/11/14   [provider]  predniSONE (DELTASONE) 5 MG tablet Take 5 mg by mouth daily with breakfast.     [provider]  tacrolimus (PROGRAF) 5 MG capsule Take 25 mg by mouth 2 (two) times daily.     [provider]  tamsulosin (FLOMAX) 0.4 MG CAPS capsule Take 0.4 mg by mouth daily after breakfast.     [provider]   Physical Exam: Blood pressure (!) 162/75, pulse (!) 111, temperature 98.2 F (36.8 C), temperature source Oral, resp. rate 17, SpO2 100 %. 1. General:  in No Acute distress   Chronically ill -appearing 2. Psychological: Alert and  Oriented 3. Head/ENT:  Dry Mucous Membranes                          Head Non traumatic, neck supple                           Poor Dentition 4. SKIN:    decreased Skin turgor,  Skin clean Dry and intact no rash 5. Heart: rapid Regular rate and rhythm  No Murmur, no Rub or gallop 6. Lungs: Clear to auscultation bilaterally, no wheezes or crackles   7. Abdomen: Soft,  non-tender, Non distended bowel sounds present 8. Lower extremities: no clubbing, cyanosis,bilateral BKA 9. Neurologically Grossly intact, moving all 4 extremities equally  10. MSK: Normal range of motion   LABS:     Recent Labs  Lab 05/23/18 1517  WBC 13.5*  HGB 10.6*  HCT 34.0*  MCV 89.7  PLT 662   Basic Metabolic Panel: Recent Labs  Lab 05/23/18 1517  NA 135  K 5.0  CL 117*  CO2 <7*  GLUCOSE 262*  BUN 85*  CREATININE 4.72*  CALCIUM 8.2*      No results for input(s): AST, ALT, ALKPHOS, BILITOT, PROT, ALBUMIN in the last 168 hours. No results for input(s): LIPASE, AMYLASE in the last 168 hours. No results for input(s): AMMONIA in the last 168 hours.    HbA1C: No results for input(s): HGBA1C in the last 72 hours. CBG: No results for input(s): GLUCAP in the last 168 hours.    Urine analysis:    Component Value Date/Time   COLORURINE YELLOW 05/23/2018 1838    APPEARANCEUR CLEAR 05/23/2018 1838   LABSPEC 1.011 05/23/2018 1838   PHURINE 5.0 05/23/2018 1838   GLUCOSEU 50 (A) 05/23/2018 1838   HGBUR MODERATE (A) 05/23/2018 1838   BILIRUBINUR NEGATIVE 05/23/2018 1838   KETONESUR NEGATIVE 05/23/2018 1838   PROTEINUR 30 (A) 05/23/2018 1838   UROBILINOGEN 0.2 08/02/2014 1424   NITRITE NEGATIVE 05/23/2018 1838   LEUKOCYTESUR NEGATIVE 05/23/2018 1838       Cultures:    Component Value Date/Time   SDES BLOOD HAND LEFT 08/02/2014 1530   SPECREQUEST BOTTLES DRAWN AEROBIC AND ANAEROBIC 5CC 08/02/2014 1530   CULT  08/02/2014 1530    ENTEROBACTER CLOACAE Note: Gram Stain Report Called to,Read Back By and Verified With: CONNIE SURHOLT 08/03/14 0900 BY SMITHERSJ    REPTSTATUS 08/05/2014 FINAL 08/02/2014 1530     Radiological Exams on Admission: Dg Chest 2 View  Result Date: 05/23/2018 CLINICAL DATA:  Fatigue and shortness of breath EXAM: CHEST - 2 VIEW COMPARISON:  03/06/2015 FINDINGS: Atherosclerotic calcification of the aortic arch. Thoracic spondylosis. No blunting of the costophrenic angles. The lungs appear clear. IMPRESSION: 1.  No active cardiopulmonary disease is radiographically apparent. 2.  Aortic Atherosclerosis (ICD10-I70.0). Electronically Signed   By: Van Clines M.D.   On: 05/23/2018 18:49    Chart has been reviewed    Assessment/Plan  63 y.o. male with medical history significant of kidney transplant on Immunosuppression, bilateral BKA, Type 2 diabetes mellitus   Essential hypertension, Anemia, Admitted for   Acute respiratory failure with hypoxia  Present on Admission: . Acute respiratory failure with hypoxia (HCC) -unclear etiology, no history of COPD or asthma chest rate unremarkable status bilateral BKA (no lower extremity edema).  Will obtain VQ scan for completeness.  Supportive management. Influenza negative, respiratory panel pending  . Dyspnea -patient may be partially trying to compensate for metabolic  acidosis.  But still unclear etiology for hypoxia.  Will further evaluate unable to obtain CTA of the chest but will obtain VQ scan if persists can obtain noncontrasted CT to evaluate lung parenchyma. Obtain echogram . Anemia -stable continue to monitor .  Hx of Atrial fibrillation remote [facility]) -have been in sinus rhythm.  States has not been on anticoagulation for 10 years. Currently sinus tachycardia on the monitor.  Continue to monitor patient needs to follow-up with cardiology  . GERD (gastroesophageal reflux disease) stable con ontinue home medications . Chronic kidney disease (CKD), stage V (HCC) avoid nephrotoxic medications monitor kidney function closely.  nephrology consult in the morning . Dyslipidemia   will continue home medications    . Metabolic acidosis -bicarb has been chronically down patient was supposed to be on Tums but has not been taking it is supposed to.  Obtain VBG.  Given severity of metabolic acidosis with bicarbonate almost undetectable we will give bicarbonate IV and follow labs . Dehydration -rehydrate and follow kidney function status hx of renal transplant -nephrology consult in a.m. followed here increase per my [place] kidney.  Continue home medications Dm 2-  - Order Sensitive   SSI   - continue home insulin regimen    -  check TSH and HgA1C  - Hold by mouth medications    Other plan as per orders.  DVT prophylaxis:   Lovenox     Code Status:  FULL CODE   as per patient   I had personally discussed CODE STATUS with patient   Family Communication:   Family not at  Bedside    Disposition Plan:        To home once workup is complete and patient is stable                   Would benefit from PT/OT eval prior to [place] called: none  Admission status:   Obs    Level of care      SDU tele indefinitely please discontinue once patient no longer qualifies      Ermias Tomeo 05/24/2018, 12:36 AM    [facility]  Pager  [phone]   after 2 AM please page floor coverage PA If 7AM-7PM, please contact the day team taking care of the patient  [email or web address]  Password [email or web address]

## 2018-05-23 NOTE — ED Provider Notes (Signed)
[facility] EMERGENCY DEPARTMENT Provider Note   CSN: [id number] Arrival date & time: 05/23/18  1454  History   Chief Complaint Chief Complaint  Patient presents with  . Shortness of Breath  . Weakness    HPI Paul [name] is a 63 y.o. male with h/o DM leading to renal failure s/p right kidney transplant January 2016 on prograf and prednisone, chronic immunosuppression, anemia, bilateral BKA is here for evaluation of generalized weakness and exertional shortness of breath.  Onset yesterday. Associated with runny nose, nasal congestion, dry cough, nausea, vomiting, decreased appetite for several days.  He went to PCP on Monday and diagnosed with a skin infection on nose so has been taking oral antibiotics and using nasal sprays for this.  States today he went to take a shower and felt more "winded" than normal, had to sit down.  Unsure of last BM.  Denies fever, CP, abdominal pain, diarrhea, dysuria, frequency, hematuria.   HPI  Past Medical History:  Diagnosis Date  . A-fib ([place])   . Anemia    esrd  . Calciphylaxis 05/2013   lower ext  . Cancer ([place])    hx duodenal adenoCa 2002, resected  . Closed left arm fracture 1967  . Corneal abrasion, left   . Depression   . Diabetes mellitus    Type 1 iddm x 11 yrs  . Diabetic neuropathy ([place])   . ESRD on hemodialysis ([place])    Pt has ESRD due to DM.  He had a L upper arm AVF prior to starting HD which was ligated due to L arm steal syndrome.  He started HD in Jan 2014 and did home HD.  The family couldn't cannulate the R arm AVF successfully so by mid 2014 they decided to switch to PD which was done late summer 2014.  In Jan 2015 he was admitted with FTT felt to be due to underdialysis on PD and PD was abandoned and he   . GERD (gastroesophageal reflux disease)   . Glomerulosclerosis, diabetic ([place])   . Heart murmur   . History of blood transfusion   . Hypertension    off of meds due to orthostatic hypotension  .  Kidney transplant recipient   . Open wound of both legs with complication    Pt has had progressive wounds of both LE's including gangrene of the toes and patchy necrosis of the calves.  He has been treated at [facility] by Dr Jerline Pain with hyperbaric O2 5d per week.  He is getting Na Thiosulfate with HD for suspected calciphylaxis. Pt says doctor's aren't sure if these ulcers were diabetic ulcers or calciphylaxis.  He underwent bilat BKA on 07/30/13.  He says that the woun    Patient Active Problem List   Diagnosis Date Noted  . Abnormal cardiovascular stress test 12/29/2013  . Awaiting organ transplant 12/09/2013  . Leg ulcer ([place]) 11/17/2013  . Chronic kidney disease (CKD), stage V ([place]) 11/17/2013  . Diabetes ([place]) 11/17/2013  . Cancer of duodenum ([place]) 11/17/2013  . Dyslipidemia 11/17/2013  . Chronic kidney disease requiring chronic dialysis ([place]) 11/17/2013  . Hypercholesteremia 11/17/2013  . H/O malignant neoplasm 11/17/2013  . H/O: HTN (hypertension) 11/17/2013  . BP (high blood pressure) 11/17/2013  . Hypotension 11/17/2013  . Angiopathy, peripheral ([place]) 11/17/2013  . AF (paroxysmal atrial fibrillation) ([place]) 11/17/2013  . BKA stump complication ([place]) 03/50/0938  . S/P bilateral BKA (below knee amputation) ([place]) 08/03/2013  . S/P BKA (below knee  amputation) bilateral ([place]) 07/30/2013  . Open wound of both legs with complication 42/70/6237  . Acute blood loss anemia 07/12/2013  . Syncope 07/09/2013  . Intolerance to CAPD peritoneal dialysis s/p CAPD cath removal 07/09/2013 07/05/2013  . Critical lower limb ischemia 06/30/2013  . Extremity pain 06/30/2013  . Atherosclerosis of native arteries of the extremities with ulceration(440.23) 06/17/2013  . Gait disorder 05/31/2013  . Weakness 05/24/2013  . Depression 04/03/2013  . GERD (gastroesophageal reflux disease) 04/03/2013  . Atrial fibrillation ([place]) 04/02/2013  . DM (diabetes mellitus) ([place]) 12/28/2012  . Other  complications due to renal dialysis device, implant, and graft 05/19/2012  . Diabetes mellitus, type 2 ([place]) 11/13/2011  . Anemia 07/02/2011  . ESRD on hemodialysis ([place]) 05/29/2011    Past Surgical History:  Procedure Laterality Date  . AMPUTATION Bilateral 07/30/2013   Procedure: AMPUTATION BELOW KNEE;  Surgeon: Newt Minion, MD;  Location: [facility];  Service: Orthopedics;  Laterality: Bilateral;  Bilateral Below Knee Amputations  . AMPUTATION Bilateral 09/03/2013   Procedure: AMPUTATION BELOW KNEE;  Surgeon: Newt Minion, MD;  Location: [facility];  Service: Orthopedics;  Laterality: Bilateral;  Bilateral Below Knee Amputation Revision  . AV FISTULA PLACEMENT  06/07/2011   Procedure: ARTERIOVENOUS (AV) FISTULA CREATION;  Surgeon: Angelia Mould, MD;  Location: [facility];  Service: Vascular;  Laterality: Left;  . AV FISTULA PLACEMENT  06/18/2012   Procedure: ARTERIOVENOUS (AV) FISTULA CREATION;  Surgeon: Mal Misty, MD;  Location: [facility];  Service: Vascular;  Laterality: Right;  [place]  . BILIARY DIVERSION, EXTERNAL  2002  . BONE MARROW BIOPSY  2012  . CAPD INSERTION N/A 01/14/2013   Procedure: LAPAROSCOPIC INSERTION CONTINUOUS AMBULATORY PERITONEAL DIALYSIS  (CAPD) CATHETER;  Surgeon: Adin Hector, MD;  Location: [facility];  Service: General;  Laterality: N/A;  . CAPD REMOVAL N/A 07/09/2013   Procedure: CONTINUOUS AMBULATORY PERITONEAL DIALYSIS  (CAPD) CATHETER REMOVAL;  Surgeon: Adin Hector, MD;  Location: [facility];  Service: General;  Laterality: N/A;  . COLONOSCOPY     Hx: of  . INSERTION OF DIALYSIS CATHETER  05/22/2012   Procedure: INSERTION OF DIALYSIS CATHETER;  Surgeon: Mal Misty, MD;  Location: [facility];  Service: Vascular;  Laterality: N/A;  right internal jugular vein  . LIGATION OF ARTERIOVENOUS  FISTULA  05/22/2012   Procedure: LIGATION OF ARTERIOVENOUS  FISTULA;  Surgeon: Mal Misty, MD;  Location: [facility];  Service: Vascular;  Laterality: Left;  Left brachio-cephalic fistula    . LOWER EXTREMITY ANGIOGRAM N/A 06/14/2013   Procedure: LOWER EXTREMITY ANGIOGRAM;  Surgeon: Angelia Mould, MD;  Location: [facility] CATH LAB;  Service: Cardiovascular;  Laterality: N/A;  . NEPHRECTOMY TRANSPLANTED ORGAN    . OTHER SURGICAL HISTORY     Cyst removed from back  . PORTACATH PLACEMENT  2002  . REMOVAL OF A DIALYSIS CATHETER    . RENAL BIOPSY, PERCUTANEOUS  2012  . TONSILLECTOMY    . VASCULAR SURGERY    . WHIPPLE PROCEDURE  2002   duodenal ca, Dr. Harlow Asa        Home Medications    Prior to Admission medications   Medication Sig Start Date End Date Taking? Authorizing Provider  amLODipine (NORVASC) 10 MG tablet Take 10 mg by mouth daily.    [provider]  aspirin EC 81 MG tablet Take 81 mg by mouth daily.    [provider]  atorvastatin (LIPITOR) 10 MG tablet Take 1 tablet (10 mg total)  by mouth daily. 08/13/13   Angiulli, Lavon Paganini, PA-C  cholecalciferol (VITAMIN D) 1000 UNITS tablet Take 1,000 Units by mouth daily.    [provider]  insulin glargine (LANTUS) 100 UNIT/ML injection Inject 12 Units into the skin 2 (two) times daily.     [provider]  insulin lispro (HUMALOG) 100 UNIT/ML injection Inject 6 Units into the skin 3 (three) times daily before meals.     [provider]  magnesium chloride (SLOW-MAG) 64 MG TBEC SR tablet Take by mouth. 08/26/14   [provider]  mycophenolate (MYFORTIC) 180 MG EC tablet Take 180 mg by mouth 2 (two) times daily.    [provider]  omeprazole (PRILOSEC) 40 MG capsule  01/11/14   [provider]  predniSONE (DELTASONE) 5 MG tablet Take 5 mg by mouth daily with breakfast.     [provider]  tacrolimus (PROGRAF) 5 MG capsule Take 25 mg by mouth 2 (two) times daily.     [provider]  tamsulosin (FLOMAX) 0.4 MG CAPS capsule Take 0.4 mg by mouth daily after breakfast.     [provider]    Family History Family History   Problem Relation Age of Onset  . Cancer Mother   . Cancer Father        prostate cancer  . Heart disease Maternal Grandfather   . Stroke Paternal Grandmother     Social History Social History   Tobacco Use  . Smoking status: Never Smoker  . Smokeless tobacco: Never Used  Substance Use Topics  . Alcohol use: No  . Drug use: No     Allergies   Metformin and related   Review of Systems Review of Systems  HENT: Positive for congestion and rhinorrhea.   Respiratory: Positive for cough.   Gastrointestinal: Positive for diarrhea and vomiting.  Allergic/Immunologic: Positive for immunocompromised state.  Neurological: Positive for weakness (generalized).  All other systems reviewed and are negative.    Physical Exam Updated Vital Signs BP (!) 162/75   Pulse (!) 111   Temp 98.2 F (36.8 C) (Oral)   Resp 17   SpO2 100%   Physical Exam Vitals signs and nursing note reviewed.  Constitutional:      Appearance: He is well-developed.     Comments: Non toxic.  HENT:     Head: Normocephalic and atraumatic.     Nose: Nose normal.  Eyes:     Conjunctiva/sclera: Conjunctivae normal.     Pupils: Pupils are equal, round, and reactive to light.  Neck:     Musculoskeletal: Normal range of motion.  Cardiovascular:     Rate and Rhythm: Normal rate and regular rhythm.     Heart sounds: Normal heart sounds.  Pulmonary:     Effort: Pulmonary effort is normal.     Breath sounds: Examination of the right-lower field reveals wheezing. Examination of the left-lower field reveals wheezing. Wheezing present.  Abdominal:     General: Bowel sounds are normal.     Palpations: Abdomen is soft.     Tenderness: There is no abdominal tenderness.     Comments: No G/R/R. No suprapubic or CVA tenderness. Negative Murphy's and McBurney's  Musculoskeletal: Normal range of motion.     Comments: S/p bilateral BKA. Small wound to left lateral lower extremities approx size of dime with  circumferential erythema and yellow drainage.  No odor. No streaking of erythema.   Skin:    General: Skin is warm and dry.  Capillary Refill: Capillary refill takes less than 2 seconds.  Neurological:     Mental Status: He is alert and oriented to person, place, and time.  Psychiatric:        Behavior: Behavior normal.        Thought Content: Thought content normal.        Judgment: Judgment normal.      ED Treatments / Results  Labs (all labs ordered are listed, but only abnormal results are displayed) Labs Reviewed  BASIC METABOLIC PANEL - Abnormal; Notable for the following components:      Result Value   Chloride 117 (*)    CO2 <7 (*)    Glucose, Bld 262 (*)    BUN 85 (*)    Creatinine, Ser 4.72 (*)    Calcium 8.2 (*)    GFR calc non Af Amer 12 (*)    GFR calc Af Amer 14 (*)    All other components within normal limits  CBC - Abnormal; Notable for the following components:   WBC 13.5 (*)    RBC 3.79 (*)    Hemoglobin 10.6 (*)    HCT 34.0 (*)    All other components within normal limits  URINALYSIS, ROUTINE W REFLEX MICROSCOPIC - Abnormal; Notable for the following components:   Glucose, UA 50 (*)    Hgb urine dipstick MODERATE (*)    Protein, ur 30 (*)    Bacteria, UA RARE (*)    All other components within normal limits  URINE CULTURE  INFLUENZA PANEL BY PCR (TYPE A & B)  I-STAT CG4 LACTIC ACID, ED  I-STAT TROPONIN, ED    EKG None  Radiology Dg Chest 2 View  Result Date: 05/23/2018 CLINICAL DATA:  Fatigue and shortness of breath EXAM: CHEST - 2 VIEW COMPARISON:  03/06/2015 FINDINGS: Atherosclerotic calcification of the aortic arch. Thoracic spondylosis. No blunting of the costophrenic angles. The lungs appear clear. IMPRESSION: 1.  No active cardiopulmonary disease is radiographically apparent. 2.  Aortic Atherosclerosis (ICD10-I70.0). Electronically Signed   By: Van Clines M.D.   On: 05/23/2018 18:49    Procedures Procedures (including  critical care time)  Medications Ordered in ED Medications  sodium chloride 0.9 % bolus 1,000 mL (1,000 mLs Intravenous New Bag/Given 05/23/18 2208)  ondansetron (ZOFRAN-ODT) disintegrating tablet 4 mg (4 mg Oral Given 05/23/18 1719)  ipratropium-albuterol (DUONEB) 0.5-2.5 (3) MG/3ML nebulizer solution 3 mL (3 mLs Nebulization Given 05/23/18 1906)  ipratropium-albuterol (DUONEB) 0.5-2.5 (3) MG/3ML nebulizer solution 3 mL (3 mLs Nebulization Given 05/23/18 2155)     Initial Impression / Assessment and Plan / ED Course  I have reviewed the triage vital signs and the nursing notes.  Pertinent labs & imaging results that were available during my care of the patient were reviewed by me and considered in my medical decision making (see chart for details).  Clinical Course as of May 23 2304  Sat May 23, 2018  2205 Last 3.1 12/2017  Creatinine(!): 4.72 [CG]    Clinical Course User Index [CG] Kinnie Feil, PA-C   Concern for viral process leading to dehydration vs PNA leading to exertional dyspnea.  Will obtain labs, EKG, UA, CXR, trop, reassess.   2010: Creatinine 4.72 from 3.1 at last transplant appointment.  UA with moderate hgb, rare bacteria but no nitrite, leuks, WBCs.  WBC 13.5 in setting of chronic prednisone use, afebrile w/o lactic acidosis.  Pt attempted to ambulate but became hypoxic with just standing up, SOB, light-headed.  Flu negative.  We will repeat duoneb. 1 L IVF for likel prerenal AKI.  I wonder if infiltrate is delayed on CXR. He is now slightly tachycardic.  Will speak to hospitalist for admission.  Final Clinical Impressions(s) / ED Diagnoses   Final diagnoses:  Dyspnea on exertion    ED Discharge Orders    None       Arlean Hopping 05/23/18 2306    Margette Fast, MD 05/24/18 [phone]

## 2018-05-23 NOTE — ED Notes (Signed)
Attempted to ambulate patient. Patient felt short of breath while putting on his prosthetic legs so he took a break. When he attempted to stand his O2 saturation dropped to 88% so we did not continue. Once he sat down his O2 sat went back up to 100%.

## 2018-05-23 NOTE — ED Notes (Signed)
Pt notified this NT of feeling nauseous. Notified Merleen Nicely- PA.

## 2018-05-24 ENCOUNTER — Observation Stay (HOSPITAL_BASED_OUTPATIENT_CLINIC_OR_DEPARTMENT_OTHER): Payer: Managed Care, Other (non HMO)

## 2018-05-24 ENCOUNTER — Encounter (HOSPITAL_COMMUNITY): Payer: Self-pay | Admitting: *Deleted

## 2018-05-24 ENCOUNTER — Observation Stay (HOSPITAL_COMMUNITY): Payer: Managed Care, Other (non HMO)

## 2018-05-24 ENCOUNTER — Other Ambulatory Visit (HOSPITAL_COMMUNITY): Payer: Medicare Other

## 2018-05-24 DIAGNOSIS — J9601 Acute respiratory failure with hypoxia: Secondary | ICD-10-CM | POA: Diagnosis not present

## 2018-05-24 DIAGNOSIS — E860 Dehydration: Secondary | ICD-10-CM | POA: Diagnosis present

## 2018-05-24 DIAGNOSIS — E872 Acidosis, unspecified: Secondary | ICD-10-CM | POA: Diagnosis present

## 2018-05-24 DIAGNOSIS — K219 Gastro-esophageal reflux disease without esophagitis: Secondary | ICD-10-CM | POA: Diagnosis not present

## 2018-05-24 DIAGNOSIS — R0602 Shortness of breath: Secondary | ICD-10-CM | POA: Diagnosis not present

## 2018-05-24 DIAGNOSIS — L8992 Pressure ulcer of unspecified site, stage 2: Secondary | ICD-10-CM

## 2018-05-24 DIAGNOSIS — L8990 Pressure ulcer of unspecified site, unspecified stage: Secondary | ICD-10-CM

## 2018-05-24 DIAGNOSIS — E1122 Type 2 diabetes mellitus with diabetic chronic kidney disease: Secondary | ICD-10-CM | POA: Diagnosis not present

## 2018-05-24 DIAGNOSIS — D649 Anemia, unspecified: Secondary | ICD-10-CM | POA: Diagnosis not present

## 2018-05-24 DIAGNOSIS — Z940 Kidney transplant status: Secondary | ICD-10-CM | POA: Diagnosis not present

## 2018-05-24 DIAGNOSIS — Z89511 Acquired absence of right leg below knee: Secondary | ICD-10-CM | POA: Diagnosis not present

## 2018-05-24 DIAGNOSIS — Z23 Encounter for immunization: Secondary | ICD-10-CM | POA: Diagnosis not present

## 2018-05-24 DIAGNOSIS — R000 Tachycardia, unspecified: Secondary | ICD-10-CM | POA: Diagnosis not present

## 2018-05-24 DIAGNOSIS — N185 Chronic kidney disease, stage 5: Secondary | ICD-10-CM | POA: Diagnosis not present

## 2018-05-24 DIAGNOSIS — Z794 Long term (current) use of insulin: Secondary | ICD-10-CM | POA: Diagnosis not present

## 2018-05-24 DIAGNOSIS — E785 Hyperlipidemia, unspecified: Secondary | ICD-10-CM | POA: Diagnosis not present

## 2018-05-24 DIAGNOSIS — R0609 Other forms of dyspnea: Secondary | ICD-10-CM | POA: Diagnosis not present

## 2018-05-24 DIAGNOSIS — I120 Hypertensive chronic kidney disease with stage 5 chronic kidney disease or end stage renal disease: Secondary | ICD-10-CM | POA: Diagnosis not present

## 2018-05-24 LAB — BASIC METABOLIC PANEL
Anion gap: 9 (ref 5–15)
BUN: 84 mg/dL — ABNORMAL HIGH (ref 8–23)
CHLORIDE: 118 mmol/L — AB (ref 98–111)
CO2: 9 mmol/L — ABNORMAL LOW (ref 22–32)
Calcium: 8 mg/dL — ABNORMAL LOW (ref 8.9–10.3)
Creatinine, Ser: 4.88 mg/dL — ABNORMAL HIGH (ref 0.61–1.24)
GFR calc Af Amer: 14 mL/min — ABNORMAL LOW (ref >60)
GFR calc non Af Amer: 12 mL/min — ABNORMAL LOW (ref >60)
Glucose, Bld: 242 mg/dL — ABNORMAL HIGH (ref 70–99)
Potassium: 4.8 mmol/L (ref 3.5–5.1)
Sodium: 136 mmol/L (ref 135–145)

## 2018-05-24 LAB — COMPREHENSIVE METABOLIC PANEL
ALT: 10 U/L (ref 0–44)
AST: 12 U/L — ABNORMAL LOW (ref 15–41)
Albumin: 3 g/dL — ABNORMAL LOW (ref 3.5–5.0)
Alkaline Phosphatase: 71 U/L (ref 38–126)
Anion gap: 10 (ref 5–15)
BUN: 82 mg/dL — ABNORMAL HIGH (ref 8–23)
CO2: 8 mmol/L — ABNORMAL LOW (ref 22–32)
Calcium: 8 mg/dL — ABNORMAL LOW (ref 8.9–10.3)
Chloride: 119 mmol/L — ABNORMAL HIGH (ref 98–111)
Creatinine, Ser: 4.71 mg/dL — ABNORMAL HIGH (ref 0.61–1.24)
GFR calc Af Amer: 14 mL/min — ABNORMAL LOW (ref >60)
GFR calc non Af Amer: 12 mL/min — ABNORMAL LOW (ref >60)
Glucose, Bld: 245 mg/dL — ABNORMAL HIGH (ref 70–99)
Potassium: 4.7 mmol/L (ref 3.5–5.1)
SODIUM: 137 mmol/L (ref 135–145)
Total Bilirubin: 0.2 mg/dL — ABNORMAL LOW (ref 0.3–1.2)
Total Protein: 5.9 g/dL — ABNORMAL LOW (ref 6.5–8.1)

## 2018-05-24 LAB — RESPIRATORY PANEL BY PCR
Adenovirus: NOT DETECTED
Bordetella pertussis: NOT DETECTED
Chlamydophila pneumoniae: NOT DETECTED
Coronavirus 229E: NOT DETECTED
Coronavirus HKU1: NOT DETECTED
Coronavirus NL63: NOT DETECTED
Coronavirus OC43: NOT DETECTED
Influenza A: NOT DETECTED
Influenza B: NOT DETECTED
Metapneumovirus: NOT DETECTED
Mycoplasma pneumoniae: NOT DETECTED
PARAINFLUENZA VIRUS 4-RVPPCR: NOT DETECTED
Parainfluenza Virus 1: NOT DETECTED
Parainfluenza Virus 2: NOT DETECTED
Parainfluenza Virus 3: NOT DETECTED
Respiratory Syncytial Virus: NOT DETECTED
Rhinovirus / Enterovirus: NOT DETECTED

## 2018-05-24 LAB — GLUCOSE, CAPILLARY
GLUCOSE-CAPILLARY: 272 mg/dL — AB (ref 70–99)
Glucose-Capillary: 230 mg/dL — ABNORMAL HIGH (ref 70–99)
Glucose-Capillary: 311 mg/dL — ABNORMAL HIGH (ref 70–99)
Glucose-Capillary: 309 mg/dL — ABNORMAL HIGH (ref 70–99)

## 2018-05-24 LAB — MAGNESIUM: Magnesium: 1.8 mg/dL (ref 1.7–2.4)

## 2018-05-24 LAB — ECHOCARDIOGRAM COMPLETE
Height: 73 in
Weight: 2638.47 oz

## 2018-05-24 LAB — CBG MONITORING, ED: Glucose-Capillary: 236 mg/dL — ABNORMAL HIGH (ref 70–99)

## 2018-05-24 LAB — CBC
HCT: 27.9 % — ABNORMAL LOW (ref 39.0–52.0)
Hemoglobin: 9.1 g/dL — ABNORMAL LOW (ref 13.0–17.0)
MCH: 28.3 pg (ref 26.0–34.0)
MCHC: 32.6 g/dL (ref 30.0–36.0)
MCV: 86.9 fL (ref 80.0–100.0)
Platelets: 182 10*3/uL (ref 150–400)
RBC: 3.21 MIL/uL — ABNORMAL LOW (ref 4.22–5.81)
RDW: 14.3 % (ref 11.5–15.5)
WBC: 10.6 10*3/uL — ABNORMAL HIGH (ref 4.0–10.5)
nRBC: 0.2 % (ref 0.0–0.2)

## 2018-05-24 LAB — HEMOGLOBIN A1C
Hgb A1c MFr Bld: 7.9 % — ABNORMAL HIGH (ref 4.8–5.6)
Mean Plasma Glucose: 180.03 mg/dL

## 2018-05-24 LAB — PHOSPHORUS: Phosphorus: 5.6 mg/dL — ABNORMAL HIGH (ref 2.5–4.6)

## 2018-05-24 LAB — HIV ANTIBODY (ROUTINE TESTING W REFLEX): HIV SCREEN 4TH GENERATION: NONREACTIVE

## 2018-05-24 LAB — LACTIC ACID, PLASMA: Lactic Acid, Venous: 1.7 mmol/L (ref 0.5–1.9)

## 2018-05-24 LAB — TSH: TSH: 2.198 u[IU]/mL (ref 0.350–4.500)

## 2018-05-24 MED ORDER — SODIUM BICARBONATE 8.4 % IV SOLN
INTRAVENOUS | Status: DC
  Administered 2018-05-24 – 2018-05-25 (×3): via INTRAVENOUS
  Filled 2018-05-24 (×4): qty 150

## 2018-05-24 MED ORDER — TACROLIMUS 1 MG PO CAPS
3.0000 mg | ORAL_CAPSULE | Freq: Every day | ORAL | Status: DC
  Administered 2018-05-24 (×2): 3 mg via ORAL
  Filled 2018-05-24 (×2): qty 3

## 2018-05-24 MED ORDER — INSULIN ASPART 100 UNIT/ML ~~LOC~~ SOLN
0.0000 [IU] | Freq: Three times a day (TID) | SUBCUTANEOUS | Status: DC
  Administered 2018-05-24: 3 [IU] via SUBCUTANEOUS
  Administered 2018-05-24: 7 [IU] via SUBCUTANEOUS
  Administered 2018-05-24: 5 [IU] via SUBCUTANEOUS

## 2018-05-24 MED ORDER — TACROLIMUS 5 MG PO CAPS: 15.0000 mg | ORAL_CAPSULE | Freq: Two times a day (BID) | ORAL | Status: DC

## 2018-05-24 MED ORDER — PREDNISONE 5 MG PO TABS
5.0000 mg | ORAL_TABLET | Freq: Every day | ORAL | Status: DC
  Administered 2018-05-24 – 2018-05-25 (×2): 5 mg via ORAL
  Filled 2018-05-24 (×2): qty 1

## 2018-05-24 MED ORDER — LEVALBUTEROL HCL 0.63 MG/3ML IN NEBU: 0.6300 mg | INHALATION_SOLUTION | Freq: Four times a day (QID) | RESPIRATORY_TRACT | Status: DC | PRN

## 2018-05-24 MED ORDER — INSULIN GLARGINE 100 UNIT/ML ~~LOC~~ SOLN
20.0000 [IU] | Freq: Every day | SUBCUTANEOUS | Status: DC
  Administered 2018-05-25: 20 [IU] via SUBCUTANEOUS
  Filled 2018-05-24: qty 0.2

## 2018-05-24 MED ORDER — ONDANSETRON HCL 4 MG PO TABS: 4.0000 mg | ORAL_TABLET | Freq: Four times a day (QID) | ORAL | Status: DC | PRN

## 2018-05-24 MED ORDER — ENOXAPARIN SODIUM 30 MG/0.3ML ~~LOC~~ SOLN
30.0000 mg | SUBCUTANEOUS | Status: DC
  Administered 2018-05-24 – 2018-05-25 (×2): 30 mg via SUBCUTANEOUS
  Filled 2018-05-24 (×2): qty 0.3

## 2018-05-24 MED ORDER — INSULIN ASPART 100 UNIT/ML ~~LOC~~ SOLN
0.0000 [IU] | Freq: Three times a day (TID) | SUBCUTANEOUS | Status: DC
  Administered 2018-05-25: 3 [IU] via SUBCUTANEOUS
  Administered 2018-05-25: 4 [IU] via SUBCUTANEOUS

## 2018-05-24 MED ORDER — PNEUMOCOCCAL VAC POLYVALENT 25 MCG/0.5ML IJ INJ
0.5000 mL | INJECTION | INTRAMUSCULAR | Status: AC
  Administered 2018-05-25: 0.5 mL via INTRAMUSCULAR
  Filled 2018-05-24: qty 0.5

## 2018-05-24 MED ORDER — SODIUM CHLORIDE 0.9 % IV SOLN
INTRAVENOUS | Status: AC
  Administered 2018-05-24: 02:00:00 via INTRAVENOUS

## 2018-05-24 MED ORDER — HYDROCODONE-ACETAMINOPHEN 5-325 MG PO TABS: 1.0000 | ORAL_TABLET | ORAL | Status: DC | PRN

## 2018-05-24 MED ORDER — TECHNETIUM TO 99M ALBUMIN AGGREGATED
4.2200 | Freq: Once | INTRAVENOUS | Status: AC | PRN
  Administered 2018-05-24: 4.22 via INTRAVENOUS

## 2018-05-24 MED ORDER — GUAIFENESIN ER 600 MG PO TB12
600.0000 mg | ORAL_TABLET | Freq: Two times a day (BID) | ORAL | Status: DC
  Administered 2018-05-24 – 2018-05-25 (×4): 600 mg via ORAL
  Filled 2018-05-24 (×4): qty 1

## 2018-05-24 MED ORDER — IPRATROPIUM-ALBUTEROL 0.5-2.5 (3) MG/3ML IN SOLN: 3.0000 mL | Freq: Four times a day (QID) | RESPIRATORY_TRACT | Status: DC | PRN

## 2018-05-24 MED ORDER — TECHNETIUM TC 99M DIETHYLENETRIAME-PENTAACETIC ACID
30.2000 | Freq: Once | INTRAVENOUS | Status: AC | PRN
  Administered 2018-05-24: 30.2 via RESPIRATORY_TRACT

## 2018-05-24 MED ORDER — MYCOPHENOLATE SODIUM 180 MG PO TBEC
360.0000 mg | DELAYED_RELEASE_TABLET | Freq: Two times a day (BID) | ORAL | Status: DC
  Administered 2018-05-24 – 2018-05-25 (×4): 360 mg via ORAL
  Filled 2018-05-24 (×4): qty 2

## 2018-05-24 MED ORDER — INSULIN ASPART 100 UNIT/ML ~~LOC~~ SOLN: 0.0000 [IU] | SUBCUTANEOUS | Status: DC

## 2018-05-24 MED ORDER — INSULIN GLARGINE 100 UNIT/ML ~~LOC~~ SOLN
17.0000 [IU] | Freq: Every day | SUBCUTANEOUS | Status: DC
  Administered 2018-05-24: 17 [IU] via SUBCUTANEOUS
  Filled 2018-05-24: qty 0.17

## 2018-05-24 MED ORDER — AMLODIPINE BESYLATE 10 MG PO TABS
10.0000 mg | ORAL_TABLET | Freq: Every day | ORAL | Status: DC
  Administered 2018-05-24 – 2018-05-25 (×2): 10 mg via ORAL
  Filled 2018-05-24 (×2): qty 1

## 2018-05-24 MED ORDER — INSULIN ASPART 100 UNIT/ML ~~LOC~~ SOLN
0.0000 [IU] | Freq: Every day | SUBCUTANEOUS | Status: DC
  Administered 2018-05-24: 4 [IU] via SUBCUTANEOUS

## 2018-05-24 MED ORDER — ATORVASTATIN CALCIUM 10 MG PO TABS
10.0000 mg | ORAL_TABLET | Freq: Every day | ORAL | Status: DC
  Administered 2018-05-24 – 2018-05-25 (×2): 10 mg via ORAL
  Filled 2018-05-24 (×2): qty 1

## 2018-05-24 MED ORDER — PANTOPRAZOLE SODIUM 40 MG PO TBEC
40.0000 mg | DELAYED_RELEASE_TABLET | Freq: Every day | ORAL | Status: DC
  Administered 2018-05-24 – 2018-05-25 (×2): 40 mg via ORAL
  Filled 2018-05-24 (×2): qty 1

## 2018-05-24 MED ORDER — TACROLIMUS 1 MG PO CAPS
4.0000 mg | ORAL_CAPSULE | Freq: Every day | ORAL | Status: DC
  Administered 2018-05-24 – 2018-05-25 (×2): 4 mg via ORAL
  Filled 2018-05-24 (×2): qty 4

## 2018-05-24 MED ORDER — ASPIRIN EC 81 MG PO TBEC
81.0000 mg | DELAYED_RELEASE_TABLET | Freq: Every day | ORAL | Status: DC
  Administered 2018-05-24 – 2018-05-25 (×2): 81 mg via ORAL
  Filled 2018-05-24 (×2): qty 1

## 2018-05-24 MED ORDER — ACETAMINOPHEN 325 MG PO TABS: 650.0000 mg | ORAL_TABLET | Freq: Four times a day (QID) | ORAL | Status: DC | PRN

## 2018-05-24 MED ORDER — TAMSULOSIN HCL 0.4 MG PO CAPS
0.4000 mg | ORAL_CAPSULE | Freq: Every day | ORAL | Status: DC
  Administered 2018-05-24 – 2018-05-25 (×2): 0.4 mg via ORAL
  Filled 2018-05-24 (×2): qty 1

## 2018-05-24 MED ORDER — ACETAMINOPHEN 650 MG RE SUPP: 650.0000 mg | Freq: Four times a day (QID) | RECTAL | Status: DC | PRN

## 2018-05-24 MED ORDER — ONDANSETRON HCL 4 MG/2ML IJ SOLN: 4.0000 mg | Freq: Four times a day (QID) | INTRAMUSCULAR | Status: DC | PRN

## 2018-05-24 NOTE — Progress Notes (Signed)
OT Cancellation Note  Patient Details Name: Paul [name] MRN: [id number] DOB: 05-04-1956   Cancelled Treatment:    Reason Eval/Treat Not Completed: Patient at procedure or test/ unavailable(VQ scan. Will return as schedule allows. Thank you.)  [employer], OTR/L Acute Rehab Pager: [phone] Office: [phone] 05/24/2018, 8:07 AM

## 2018-05-24 NOTE — Progress Notes (Signed)
OT Cancellation Note  Patient Details Name: Paul HOLLINGSHED MRN: [id number] DOB: [birth year]   Cancelled Treatment:    Reason Eval/Treat Not Completed: Fatigue/lethargy limiting ability to participate. First attempt @11 :06. Just finished PT evaluation and is very fatigued. Pt agreeable to participate in OT evaluation in afternoon. Will return as schedule allows.   Second attempt @ 15:18. Pt reporting he is very tired and would like to continue to rest. Requesting OT return tomorrow. Will return as schedule allows.   Thank you.  [place], OTR/L Acute Rehab Pager: [phone] Office: [phone] 05/24/2018, 3:17 PM

## 2018-05-24 NOTE — Evaluation (Signed)
Physical Therapy Evaluation Patient Details Name: Paul [name] MRN: [id number] DOB: [birth year] Today's Date: 05/24/2018   History of Present Illness  Pt is a 63 y.o. M with significant PMH of kidney transplant on immunosuppression, bilateral BKA, type 2 diabetes mellitus, essential hypertension, anemia, who presents with exertional dyspnea. VQ scan negative for pulmonary emboli.  Clinical Impression  Prior to admission, patient active, independent, and works as Chief Financial Officer. He uses a cane intermittently and wears bilateral prosthetics. He reports he has become increasingly debilitated since last Monday due to shortness of breath. On PT evaluation, patient presenting with significantly decreased endurance and weakness compared to his baseline. Performing transfers with min assist and ambulating in room with walker and min guard assist. SpO2 100% on RA, however, DOE 3/4. Becomes fatigued quickly with any type of activity, including donning prosthetics. Will follow acutely to progress as tolerated.    Follow Up Recommendations Home health PT    Equipment Recommendations  None recommended by PT    Recommendations for Other Services       Precautions / Restrictions Precautions Precautions: Fall Precaution Comments: bilat BKA, prosthetics in room Restrictions Weight Bearing Restrictions: No      Mobility  Bed Mobility Overal bed mobility: Modified Independent             General bed mobility comments: HOB up  Transfers Overall transfer level: Needs assistance Equipment used: Rolling walker (2 wheeled) Transfers: Sit to/from Stand Sit to Stand: Min assist         General transfer comment: light min assist to boost up from elevated bed  Ambulation/Gait Ambulation/Gait assistance: Min guard Gait Distance (Feet): 30 Feet Assistive device: Rolling walker (2 wheeled) Gait Pattern/deviations: Step-through pattern;Trunk flexed Gait velocity: decreased Gait velocity  interpretation: <1.8 ft/sec, indicate of risk for recurrent falls General Gait Details: Pt ambulated in room with prosthetics donned and min guard assist for safety. Slow speed  Stairs            Wheelchair Mobility    Modified Rankin (Stroke Patients Only)       Balance Overall balance assessment: Needs assistance Sitting-balance support: Feet unsupported Sitting balance-Leahy Scale: Good     Standing balance support: Bilateral upper extremity supported Standing balance-Leahy Scale: Fair                               Pertinent Vitals/Pain Pain Assessment: No/denies pain    Home Living Family/patient expects to be discharged to:: Private residence Living Arrangements: Spouse/significant other(and mother in [employer]) Available Help at Discharge: Family Type of Home: Apartment Home Access: Ramped entrance     Home Layout: Able to live on main level with bedroom/bathroom Home Equipment: Bradley - single point;Shower seat;Wheelchair - manual;Crutches      Prior Function Level of Independence: Independent with assistive device(s)         Comments: Intermittently uses cane for outdoor ambulation. Pt is an Chief Operating Officer Dominance   Dominant Hand: Right    Extremity/Trunk Assessment   Upper Extremity Assessment Upper Extremity Assessment: Overall WFL for tasks assessed    Lower Extremity Assessment Lower Extremity Assessment: RLE deficits/detail;LLE deficits/detail RLE Deficits / Details: BKA, strength at least anti gravity LLE Deficits / Details: BKA, strength at least anti gravity.        Communication   Communication: No difficulties  Cognition Arousal/Alertness: Awake/alert Behavior During Therapy: WFL for tasks assessed/performed Overall Cognitive Status:  Within Functional Limits for tasks assessed                                        General Comments General comments (skin integrity, edema, etc.): LLE residual limb  wound    Exercises     Assessment/Plan    PT Assessment Patient needs continued PT services  PT Problem List Decreased activity tolerance;Decreased mobility;Cardiopulmonary status limiting activity       PT Treatment Interventions DME instruction;Gait training;Functional mobility training;Therapeutic activities;Therapeutic exercise;Balance training;Patient/family education    PT Goals (Current goals can be found in the Care Plan section)  Acute Rehab PT Goals Patient Stated Goal: "be able to breathe." PT Goal Formulation: With patient Time For Goal Achievement: 06/07/18 Potential to Achieve Goals: Good    Frequency Min 3X/week   Barriers to discharge        Co-evaluation               AM-PAC PT "6 Clicks" Mobility  Outcome Measure Help needed turning from your back to your side while in a flat bed without using bedrails?: None Help needed moving from lying on your back to sitting on the side of a flat bed without using bedrails?: None Help needed moving to and from a bed to a chair (including a wheelchair)?: None Help needed standing up from a chair using your arms (e.g., wheelchair or bedside chair)?: A Little Help needed to walk in hospital room?: A Little Help needed climbing 3-5 steps with a railing? : A Lot 6 Click Score: 20    End of Session Equipment Utilized During Treatment: Gait belt Activity Tolerance: Patient tolerated treatment well Patient left: in bed;with call bell/phone within reach Nurse Communication: Mobility status PT Visit Diagnosis: Difficulty in walking, not elsewhere classified (R26.2)    Time: 1040-1110 PT Time Calculation (min) (ACUTE ONLY): 30 min   Charges:   PT Evaluation $PT Eval Moderate Complexity: 1 Mod PT Treatments $Therapeutic Activity: 8-22 mins        Ellamae Sia, PT, DPT Acute Rehabilitation Services Pager [phone] Office [phone]   Willy Eddy 05/24/2018, 2:56 PM

## 2018-05-24 NOTE — Progress Notes (Signed)
  Echocardiogram 2D Echocardiogram has been performed.  Paul [name] 05/24/2018, 2:11 PM

## 2018-05-24 NOTE — Progress Notes (Signed)
PROGRESS NOTE    Paul [name]  OFB:[id number] DOB: [birth year] DOA: 05/23/2018 PCP: [name], MD    Brief Narrative:  Paul [name] is a 63 y.o. male with medical history significant of kidney transplant on Immunosuppression, bilateral BKA, Type 2 diabetes mellitus   Essential hypertension, Anemia, presented with exertional dyspnea .    Assessment & Plan:   Active Problems:   Anemia   DM (diabetes mellitus) (HCC)   Atrial fibrillation (HCC)   GERD (gastroesophageal reflux disease)   S/P BKA (below knee amputation) bilateral (HCC)   S/P bilateral BKA (below knee amputation) ([place])   Chronic kidney disease (CKD), stage V (HCC)   Diabetes mellitus, type 2 (HCC)   Dyslipidemia   AF (paroxysmal atrial fibrillation) (HCC)   Dyspnea   Acute respiratory failure with hypoxia (HCC)   Metabolic acidosis   Dehydration   Pressure injury of skin   Dyspnea on Exertion:  Unclear etiology. Most likely COPD Exacerbation. No wheesing heard.  Echo to check for wall motion abnormalities.  Acute respiratory failure with hypoxia; Appears to have resolved.    Diabetes Mellitus: CBG (last 3)  Recent Labs    05/24/18 0639 05/24/18 1133 05/24/18 1636  GLUCAP 230* 272* 311*   Increase the lantus to 20 units and change to resistant SSI.    Stage 5 CKD: S/p renal transplant.  Resume home meds.   Hyperlipidemia; Resume lipitor.        DVT prophylaxis: lovenox.  Code Status: full code.  Family Communication: none at bedside.  Disposition Plan: pending clinical improvement.    Consultants:   None    Procedures: Echo.  Antimicrobials:none.    Subjective: Reports feeling exhausted.   Objective: Vitals:   05/23/18 2330 05/24/18 0105 05/24/18 0330 05/24/18 0427  BP: (!) 151/69 (!) 159/82  (!) 154/70  Pulse: (!) 111 (!) 107  86  Resp: 17 14  18   Temp:  98.1 F (36.7 C)  97.8 F (36.6 C)  TempSrc:  Oral  Oral  SpO2: 100% 100%  100%  Weight:    74.8 kg    Height:   6\' 1"  (1.854 m)     Intake/Output Summary (Last 24 hours) at 05/24/2018 1317 Last data filed at 05/24/2018 0943 Gross per 24 hour  Intake 1615.04 ml  Output 550 ml  Net 1065.04 ml   Filed Weights   05/24/18 0427  Weight: 74.8 kg    Examination:  General exam: Appears calm and comfortable  Respiratory system: Clear to auscultation. Respiratory effort normal. Cardiovascular system: S1 & S2 heard, RRR. Gastrointestinal system: Abdomen is nondistended, soft and nontender. No organomegaly or masses felt. Normal bowel sounds heard. Central nervous system: Alert and oriented. No focal neurological deficits. Extremities: bilateral BKA.  Skin: No rashes, lesions or ulcers Psychiatry: Judgement and insight appear normal. Mood & affect appropriate.     Data Reviewed: I have personally reviewed following labs and imaging studies  CBC: Recent Labs  Lab 05/23/18 1517 05/24/18 0544  WBC 13.5* 10.6*  HGB 10.6* 9.1*  HCT 34.0* 27.9*  MCV 89.7 86.9  PLT 232 782   Basic Metabolic Panel: Recent Labs  Lab 05/23/18 1517 05/24/18 0100 05/24/18 0331  NA 135 136 137  K 5.0 4.8 4.7  CL 117* 118* 119*  CO2 <7* 9* 8*  GLUCOSE 262* 242* 245*  BUN 85* 84* 82*  CREATININE 4.72* 4.88* 4.71*  CALCIUM 8.2* 8.0* 8.0*  MG  --   --  1.8  PHOS  --   --  5.6*   GFR: Estimated Creatinine Clearance: 16.8 mL/min (A) (by C-G formula based on SCr of 4.71 mg/dL (H)). Liver Function Tests: Recent Labs  Lab 05/24/18 0331  AST 12*  ALT 10  ALKPHOS 71  BILITOT 0.2*  PROT 5.9*  ALBUMIN 3.0*   No results for input(s): LIPASE, AMYLASE in the last 168 hours. No results for input(s): AMMONIA in the last 168 hours. Coagulation Profile: No results for input(s): INR, PROTIME in the last 168 hours. Cardiac Enzymes: No results for input(s): CKTOTAL, CKMB, CKMBINDEX, TROPONINI in the last 168 hours. BNP (last 3 results) No results for input(s): PROBNP in the last 8760 hours. HbA1C: Recent  Labs    05/24/18 0100  HGBA1C 7.9*   CBG: Recent Labs  Lab 05/24/18 0000 05/24/18 0639 05/24/18 1133  GLUCAP 236* 230* 272*   Lipid Profile: No results for input(s): CHOL, HDL, LDLCALC, TRIG, CHOLHDL, LDLDIRECT in the last 72 hours. Thyroid Function Tests: Recent Labs    05/24/18 0331  TSH 2.198   Anemia Panel: No results for input(s): VITAMINB12, FOLATE, FERRITIN, TIBC, IRON, RETICCTPCT in the last 72 hours. Sepsis Labs: Recent Labs  Lab 05/23/18 1906 05/24/18 0100  LATICACIDVEN 1.14 1.7    Recent Results (from the past 240 hour(s))  Respiratory Panel by PCR     Status: None   Collection Time: 05/23/18 11:57 PM  Result Value Ref Range Status   Adenovirus NOT DETECTED NOT DETECTED Final   Coronavirus 229E NOT DETECTED NOT DETECTED Final   Coronavirus HKU1 NOT DETECTED NOT DETECTED Final   Coronavirus NL63 NOT DETECTED NOT DETECTED Final   Coronavirus OC43 NOT DETECTED NOT DETECTED Final   Metapneumovirus NOT DETECTED NOT DETECTED Final   Rhinovirus / Enterovirus NOT DETECTED NOT DETECTED Final   Influenza A NOT DETECTED NOT DETECTED Final   Influenza B NOT DETECTED NOT DETECTED Final   Parainfluenza Virus 1 NOT DETECTED NOT DETECTED Final   Parainfluenza Virus 2 NOT DETECTED NOT DETECTED Final   Parainfluenza Virus 3 NOT DETECTED NOT DETECTED Final   Parainfluenza Virus 4 NOT DETECTED NOT DETECTED Final   Respiratory Syncytial Virus NOT DETECTED NOT DETECTED Final   Bordetella pertussis NOT DETECTED NOT DETECTED Final   Chlamydophila pneumoniae NOT DETECTED NOT DETECTED Final   Mycoplasma pneumoniae NOT DETECTED NOT DETECTED Final         Radiology Studies: Dg Chest 2 View  Result Date: 05/23/2018 CLINICAL DATA:  Fatigue and shortness of breath EXAM: CHEST - 2 VIEW COMPARISON:  03/06/2015 FINDINGS: Atherosclerotic calcification of the aortic arch. Thoracic spondylosis. No blunting of the costophrenic angles. The lungs appear clear. IMPRESSION: 1.  No active  cardiopulmonary disease is radiographically apparent. 2.  Aortic Atherosclerosis (ICD10-I70.0). Electronically Signed   By: Van Clines M.D.   On: 05/23/2018 18:49   Nm Pulmonary Vent And Perf (v/q Scan)  Result Date: 05/24/2018 CLINICAL DATA:  Fatigue, hypoxia exertional shortness of breath. History renal transplantation and renal failure. EXAM: NUCLEAR MEDICINE VENTILATION - PERFUSION LUNG SCAN TECHNIQUE: Ventilation images were obtained in multiple projections using inhaled aerosol Tc-36m DTPA. Perfusion images were obtained in multiple projections after intravenous injection of Tc-83m MAA. RADIOPHARMACEUTICALS:  30.2 mCi of Tc-42m DTPA aerosol inhalation and 4.2 mCi Tc56m MAA IV COMPARISON:  Chest x-ray on 05/23/2018 FINDINGS: Ventilation: Ventilatory imaging demonstrates normal distribution of DTPA aerosol throughout the lungs without significant ventilatory defect identified. There is mild diminished ventilation at the lung apices, likely reflecting  some degree of emphysematous lung disease/obstructive lung disease. Perfusion: Perfusion imaging demonstrates no segmental or subsegmental perfusion defects. IMPRESSION: Normal perfusion of both lungs with no evidence to suggest pulmonary embolism. Ventilatory imaging shows mild diminished ventilation at the lung apices which likely reflects some degree of emphysema/obstructive lung disease. Electronically Signed   By: Aletta Edouard M.D.   On: 05/24/2018 10:00        Scheduled Meds: . amLODipine  10 mg Oral Daily  . aspirin EC  81 mg Oral Daily  . atorvastatin  10 mg Oral Daily  . enoxaparin (LOVENOX) injection  30 mg Subcutaneous Q24H  . guaiFENesin  600 mg Oral BID  . insulin aspart  0-9 Units Subcutaneous TID WC  . insulin glargine  17 Units Subcutaneous Daily  . mycophenolate  360 mg Oral BID  . pantoprazole  40 mg Oral Daily  . [START ON 05/25/2018] pneumococcal 23 valent vaccine  0.5 mL Intramuscular Tomorrow-1000  . predniSONE  5  mg Oral Q breakfast  . tacrolimus  3 mg Oral QHS  . tacrolimus  4 mg Oral Daily  . tamsulosin  0.4 mg Oral QPC breakfast   Continuous Infusions: .  sodium bicarbonate  infusion 1000 mL 75 mL/hr at 05/24/18 0400     LOS: 0 days    Time spent: [place], MD [facility] Pager [phone]  If 7PM-7AM, please contact night-coverage [email or web address] Password [email or web address] 05/24/2018, 1:17 PM

## 2018-05-25 DIAGNOSIS — Z794 Long term (current) use of insulin: Secondary | ICD-10-CM | POA: Diagnosis not present

## 2018-05-25 DIAGNOSIS — E872 Acidosis: Secondary | ICD-10-CM | POA: Diagnosis not present

## 2018-05-25 DIAGNOSIS — N185 Chronic kidney disease, stage 5: Secondary | ICD-10-CM | POA: Diagnosis not present

## 2018-05-25 DIAGNOSIS — R0609 Other forms of dyspnea: Secondary | ICD-10-CM | POA: Diagnosis not present

## 2018-05-25 DIAGNOSIS — E1122 Type 2 diabetes mellitus with diabetic chronic kidney disease: Secondary | ICD-10-CM | POA: Diagnosis not present

## 2018-05-25 DIAGNOSIS — J9601 Acute respiratory failure with hypoxia: Secondary | ICD-10-CM | POA: Diagnosis not present

## 2018-05-25 LAB — BASIC METABOLIC PANEL
ANION GAP: 11 (ref 5–15)
BUN: 71 mg/dL — ABNORMAL HIGH (ref 8–23)
CO2: 15 mmol/L — ABNORMAL LOW (ref 22–32)
Calcium: 7.3 mg/dL — ABNORMAL LOW (ref 8.9–10.3)
Chloride: 112 mmol/L — ABNORMAL HIGH (ref 98–111)
Creatinine, Ser: 4.15 mg/dL — ABNORMAL HIGH (ref 0.61–1.24)
GFR calc Af Amer: 16 mL/min — ABNORMAL LOW (ref >60)
GFR calc non Af Amer: 14 mL/min — ABNORMAL LOW (ref >60)
Glucose, Bld: 206 mg/dL — ABNORMAL HIGH (ref 70–99)
Potassium: 3.8 mmol/L (ref 3.5–5.1)
Sodium: 138 mmol/L (ref 135–145)

## 2018-05-25 LAB — URINE CULTURE: Culture: NO GROWTH

## 2018-05-25 LAB — GLUCOSE, CAPILLARY
GLUCOSE-CAPILLARY: 166 mg/dL — AB (ref 70–99)
Glucose-Capillary: 150 mg/dL — ABNORMAL HIGH (ref 70–99)

## 2018-05-25 MED ORDER — IPRATROPIUM-ALBUTEROL 0.5-2.5 (3) MG/3ML IN SOLN: 3.0000 mL | Freq: Four times a day (QID) | RESPIRATORY_TRACT | 2 refills | Status: DC | PRN

## 2018-05-25 MED ORDER — BASAGLAR KWIKPEN 100 UNIT/ML ~~LOC~~ SOPN: 20.0000 [IU] | PEN_INJECTOR | Freq: Every day | SUBCUTANEOUS | Status: DC

## 2018-05-25 NOTE — Care Management Note (Signed)
Case Management Note Marvetta Gibbons RN, BSN Transitions of Care Unit 4E- RN Case Manager [phone]  Patient Details  Name: Paul [name] MRN: [id number] Date of Birth: [birth year]  Subjective/Objective:    Pt presented with SOB, resp. Distress, anemia              Action/Plan: PTA pt lived at home, hx of bil BKA, - pt has prosthesis. Per PT eval recommendation for HHPT, orders have been placed for HHRN/PT/aide- CM spoke with pt at bedside, pt open to [facility] services however states he wants to get back to work soon. Explained to pt that once he returned to work would not be eligible for [facility] services- pt voiced understanding. CM provided list per CMS website with star ratings. Pt has [employer] which has to go through their [facility] care coordinator for approval and setup. Pt voices he has no real preference but would like [facility] if possible. - Call made to [facility] for [facility] referral - spoke with Erasmo Downer- pt's intake ID is [id number]- per [facility] start of care will be 05/27/18 pending approval and securing of [facility] agency. Orders and support documents have been faxed to [facility] at - [phone]. Pt provided [facility] info.   Expected Discharge Date:  05/25/18               Expected Discharge Plan:  [place]  In-House Referral:  NA  Discharge planning Services  CM Consult  Post Acute Care Choice:  Home Health Choice offered to:  Patient  DME Arranged:    DME Agency:     HH Arranged:  RN, PT [place] Agency:  [employer](pending Novella Rob)  Status of Service:  Completed, signed off  If discussed at [place] of Stay Meetings, dates discussed:    Discharge Disposition: home/home health   Additional Comments:  Dawayne Patricia, RN 05/25/2018, 1:03 PM

## 2018-05-25 NOTE — Evaluation (Signed)
Occupational Therapy Evaluation Patient Details Name: Paul [name] MRN: [id number] DOB: November 20, 1955 Today's Date: 05/25/2018    History of Present Illness Pt is a 63 y.o. M with significant PMH of kidney transplant on immunosuppression, bilateral BKA, type 2 diabetes mellitus, essential hypertension, anemia, who presents with exertional dyspnea. VQ scan negative for pulmonary emboli.   Clinical Impression   Pt is typically independent with use of his B LE prosthetics. He presents with decreased activity tolerance and generalized weakness. Session limited to EOB with pt waiting for wound care nurse for L residual limb. Educated in energy conservation strategies and gave pt handout. Will follow pt acutely. Do not anticipate he will need post acute OT.    Follow Up Recommendations  No OT follow up    Equipment Recommendations  None recommended by OT    Recommendations for Other Services       Precautions / Restrictions Precautions Precautions: Fall Precaution Comments: bilat BKA, prosthetics in room, wound on L residual limb Restrictions Weight Bearing Restrictions: No      Mobility Bed Mobility Overal bed mobility: Modified Independent             General bed mobility comments: HOB up slightly  Transfers                 General transfer comment: deferred, awaiting medicine for wound on L residual limb    Balance Overall balance assessment: Needs assistance   Sitting balance-Leahy Scale: Good                                     ADL either performed or assessed with clinical judgement   ADL Overall ADL's : Needs assistance/impaired Eating/Feeding: Independent;Bed level   Grooming: Wash/dry hands;Wash/dry face;Oral care;Sitting;Set up   Upper Body Bathing: Set up;Sitting   Lower Body Bathing: Set up;Sitting/lateral leans   Upper Body Dressing : Set up;Sitting     Lower Body Dressing Details (indicate cue type and reason): did not  perform, pt awaiting return of WOC nurse to look at wound                     Vision Baseline Vision/History: Wears glasses Patient Visual Report: No change from baseline       Perception     Praxis      Pertinent Vitals/Pain Pain Assessment: No/denies pain     Hand Dominance Right   Extremity/Trunk Assessment Upper Extremity Assessment Upper Extremity Assessment: Generalized weakness(issued and instructed in use of theraband)   Lower Extremity Assessment Lower Extremity Assessment: Defer to PT evaluation   Cervical / Trunk Assessment Cervical / Trunk Assessment: Normal   Communication Communication Communication: No difficulties   Cognition Arousal/Alertness: Awake/alert Behavior During Therapy: WFL for tasks assessed/performed Overall Cognitive Status: Within Functional Limits for tasks assessed                                     General Comments       Exercises     Shoulder Instructions      Home Living Family/patient expects to be discharged to:: Private residence Living Arrangements: Spouse/significant other(and mother in law) Available Help at Discharge: Family Type of Home: Apartment Home Access: Ramped entrance     Home Layout: Able to live on main level with bedroom/bathroom  Bathroom Shower/Tub: Chief Strategy Officer: Kasandra Knudsen - single point;Shower seat;Wheelchair - manual;Crutches          Prior Functioning/Environment Level of Independence: Independent with assistive device(s)        Comments: Intermittently uses cane for outdoor ambulation. 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        OT Problem List: Decreased activity tolerance;Decreased strength      OT Treatment/Interventions: Self-care/ADL training;DME and/or AE instruction;Therapeutic activities;Patient/family education;Balance training;Therapeutic exercise    OT Goals(Current goals can be found in the care plan  section) Acute Rehab OT Goals Patient Stated Goal: "be able to breathe." OT Goal Formulation: With patient Time For Goal Achievement: 06/08/18 Potential to Achieve Goals: Good ADL Goals Pt Will Perform Grooming: with supervision;standing Pt Will Perform Lower Body Bathing: with modified independence;sitting/lateral leans Pt Will Perform Lower Body Dressing: with modified independence;sitting/lateral leans;sit to/from stand Pt Will Transfer to Toilet: with supervision;regular height toilet;ambulating Pt Will Perform Toileting - Clothing Manipulation and hygiene: sit to/from stand;with supervision Pt/caregiver will Perform Home Exercise Program: Increased strength;Both right and left upper extremity;With theraband;Independently Additional ADL Goal #1: Pt will state at least 3 energy conservation strategies and utilize pursed lip breathing techniques in [place] mobility independently.  OT Frequency: Min 2X/week   Barriers to D/C:            Co-evaluation              AM-PAC OT "6 Clicks" Daily Activity     Outcome Measure Help from another person eating meals?: None Help from another person taking care of personal grooming?: None Help from another person toileting, which includes using toliet, bedpan, or urinal?: A Little Help from another person bathing (including washing, rinsing, drying)?: A Little Help from another person to put on and taking off regular upper body clothing?: None Help from another person to put on and taking off regular lower body clothing?: A Little 6 Click Score: 21   End of Session    Activity Tolerance: Patient tolerated treatment well Patient left: in bed;with call bell/phone within reach  OT Visit Diagnosis: Other (comment)(decreased activity tolerance)                Time: 1937-9024 OT Time Calculation (min): 20 min Charges:  OT General Charges $OT Visit: 1 Visit OT Evaluation $OT Eval Moderate Complexity: 1 Mod  Nestor Lewandowsky, OTR/L Acute  Rehabilitation Services Pager: [phone] Office: [phone]  Malka So 05/25/2018, 10:31 AM

## 2018-05-25 NOTE — Progress Notes (Addendum)
Education and discharge paperwork provided. Iv removed and intact. Pt denies any complaints. Tele box removed/ccmd notified. Pt denies any complaints.pt tx via wheelchair to [place] parking to meet wife. Jerald Kief, RN

## 2018-05-25 NOTE — Consult Note (Signed)
[place] Nurse wound consult note Reason for Consult: Nonhealing medical device related pressure injury to left lateral BKA site.  Wears bilateral BKA prosthesis Wound type: DRPI Pressure Injury POA: Yes Measurement: 0.5 cm x 1 cm induration with 0.1 cm pinpoint pustule in the center.  Patient has been applying dry dressing at home.   Wound bed:see above Drainage (amount, consistency, odor) Minimal serosanguinous  Periwound: Healed BKA incision site.   Dressing procedure/placement/frequency: CLeanse left lateral BKA wound with NS.  Apply Xeroform gauze to wound bed.  Secure with gauze and kerlix/tape.  Change daily.  Will not follow at this time.  Please re-consult if needed.  Domenic Moras MSN, RN, FNP-BC CWON Wound, Ostomy, Continence Nurse Pager [phone]

## 2018-05-25 NOTE — Progress Notes (Signed)
Occupational Therapy Treatment Patient Details Name: Paul [name] MRN: [id number] DOB: 1956/04/14 Today's Date: 05/25/2018    History of present illness Pt is a 63 y.o. M with significant PMH of kidney transplant on immunosuppression, bilateral BKA, type 2 diabetes mellitus, essential hypertension, anemia, who presents with exertional dyspnea. VQ scan negative for pulmonary emboli.   OT comments  Pt requesting second OT visit for OOB activity. Pt able to dress, including donning B LE prostheses, ambulate in hall while conversing and perform toileting without rest breaks. Pt using RW appropriately. Reinforced pacing for energy conservation. Pt is confident he is ready for home.  Follow Up Recommendations  No OT follow up    Equipment Recommendations  None recommended by OT    Recommendations for Other Services      Precautions / Restrictions Precautions Precaution Comments: bilat BKA, prosthetics in room, wound on L residual limb       Mobility Bed Mobility Overal bed mobility: Modified Independent             General bed mobility comments: HOB up slightly  Transfers Overall transfer level: Modified independent Equipment used: Rolling walker (2 wheeled)             General transfer comment: increased time, good technique    Balance Overall balance assessment: Needs assistance   Sitting balance-Leahy Scale: Good       Standing balance-Leahy Scale: Fair                             ADL either performed or assessed with clinical judgement   ADL                   Upper Body Dressing : Set up;Sitting     Lower Body Dressing Details (indicate cue type and reason): set up for shorts and prostheses                     Vision       Perception     Praxis      Cognition Arousal/Alertness: Awake/alert Behavior During Therapy: Flat affect Overall Cognitive Status: Within Functional Limits for tasks assessed                                           Exercises     Shoulder Instructions       General Comments      Pertinent Vitals/ Pain       Pain Assessment: No/denies pain  Home Living                                          Prior Functioning/Environment              Frequency  Min 2X/week        Progress Toward Goals  OT Goals(current goals can now be found in the care plan section)     Acute Rehab OT Goals Patient Stated Goal: return home OT Goal Formulation: With patient Time For Goal Achievement: 06/08/18 Potential to Achieve Goals: Good  Plan Discharge plan remains appropriate    Co-evaluation                 AM-PAC OT "6 Clicks"  Daily Activity     Outcome Measure   Help from another person eating meals?: None Help from another person taking care of personal grooming?: None Help from another person toileting, which includes using toliet, bedpan, or urinal?: None Help from another person bathing (including washing, rinsing, drying)?: None Help from another person to put on and taking off regular upper body clothing?: None Help from another person to put on and taking off regular lower body clothing?: None 6 Click Score: 24    End of Session Equipment Utilized During Treatment: Rolling walker;Gait belt  OT Visit Diagnosis: Other abnormalities of gait and mobility (R26.89)   Activity Tolerance Patient tolerated treatment well   Patient Left Other (comment);with call bell/phone within reach(on toilet)   Nurse Communication Mobility status        Time: 1420-1435 OT Time Calculation (min): 15 min  Charges: OT General Charges $OT Visit: 1 Visit OT Treatments $Therapeutic Activity: 8-22 mins  Nestor Lewandowsky, OTR/L Acute Rehabilitation Services Pager: [phone] Office: [phone]   Malka So 05/25/2018, 2:35 PM

## 2018-05-26 NOTE — Discharge Summary (Signed)
Physician Discharge Summary  Paul [name]:[id number] DOB: [birth year] DOA: 05/23/2018  PCP: Paul Amel, MD  Admit date: 05/23/2018 Discharge date: 05/25/2018  Admitted From: Home Disposition: Home.   Recommendations for Outpatient Follow-up:  1. Follow up with PCP in 1-2 weeks 2. Please obtain BMP/CBC in one week 3. Please follow up with Nephrology in one week.   Home Health:yes   Discharge Condition :GUARDED.  CODE STATUS:full code.  Diet recommendation: Heart Healthy   Brief/Interim Summary: Paul Wafer Bairdis a 63 y.o.malewith medical history significant of kidney transplant onImmunosuppression, bilateral BKA,Type 2 diabetes mellitus  Essential hypertension,Anemia,presented with exertional dyspnea   Discharge Diagnoses:  Active Problems:   Anemia   DM (diabetes mellitus) (HCC)   Atrial fibrillation (HCC)   GERD (gastroesophageal reflux disease)   S/P BKA (below knee amputation) bilateral (HCC)   S/P bilateral BKA (below knee amputation) ([place])   Chronic kidney disease (CKD), stage V (HCC)   Diabetes mellitus, type 2 (HCC)   Dyslipidemia   AF (paroxysmal atrial fibrillation) (HCC)   Dyspnea   Acute respiratory failure with hypoxia (HCC)   Metabolic acidosis   Dehydration   Pressure injury of skin  Dyspnea on Exertion:  Unclear etiology. Most likely COPD Exacerbation. he was started on duonebs and his breathing has improved. He did not have wheezing on exam today.  Echo to check for wall motion abnormalities. Echocardiogram showed Normal LV systolic function; mild diastolic dysfunction; sclerotic aortic valve with trace AI. He does not appear fluid overloaded.  V/q scan ruled out PE.     Acute respiratory failure with hypoxia; Appears to have resolved.    Diabetes Mellitus: CBG (last 3)  RecentLabs(last2labs)       Recent Labs    05/24/18 0639 05/24/18 1133 05/24/18 1636  GLUCAP 230* 272* 311*     Increase the lantus to 20 units .   hgba1c is 7.9.    Stage 5 CKD: S/p renal transplant.  Resume home meds.  His creatinine at baseline.  His bicarb has improved with IV bicarb. Recommend outpatient follow upw ith Nephrology in one week.   Hyperlipidemia; Resume lipitor.    Discharge Instructions  Discharge Instructions    Diet - low sodium heart healthy   Complete by:  As directed    Increase activity slowly   Complete by:  As directed      Allergies as of 05/25/2018      Reactions   Metformin And Related Other (See Comments)   Kidney problems      Medication List    TAKE these medications   amLODipine 10 MG tablet Commonly known as:  NORVASC Take 10 mg by mouth daily.   aspirin EC 81 MG tablet Take 81 mg by mouth daily.   atorvastatin 10 MG tablet Commonly known as:  LIPITOR Take 1 tablet (10 mg total) by mouth daily.   BASAGLAR KWIKPEN 100 UNIT/ML Sopn Inject 0.2 mLs (20 Units total) into the skin daily. What changed:    medication strength  how much to take   cholecalciferol 1000 units tablet Commonly known as:  VITAMIN D Take 1,000 Units by mouth daily.   ipratropium-albuterol 0.5-2.5 (3) MG/3ML Soln Commonly known as:  DUONEB Take 3 mLs by nebulization every 6 (six) hours as needed.   magnesium chloride 64 MG Tbec SR tablet Commonly known as:  SLOW-MAG Take 3 tablets by mouth 3 (three) times daily.   mycophenolate 180 MG EC tablet Commonly known as:  MYFORTIC Take  360 mg by mouth 2 (two) times daily.   omeprazole 40 MG capsule Commonly known as:  PRILOSEC Take 40 mg by mouth daily.   predniSONE 5 MG tablet Commonly known as:  DELTASONE Take 5 mg by mouth daily with breakfast.   tacrolimus 1 MG capsule Commonly known as:  PROGRAF Take 3-4 mg by mouth 2 (two) times daily. 4 mg in the AM and 3 mg in the PM   tamsulosin 0.4 MG Caps capsule Commonly known as:  FLOMAX Take 0.4 mg by mouth daily after breakfast.      Follow-up Information    Koirala, Dibas, MD.  Schedule an appointment as soon as possible for a visit in 1 week(s).   Specialty:  Family Medicine Contact information: [address] [address] [phone]        [facility] Follow up.   Why:  they will contact you when services have been secured- plan for start of care on 05/27/17- contact #- [phone] if you have not heard from them regarding services.  Contact information: Insurance Care Coordination center- providing authorization and securing [place] agency- intake ID- [id number]         Allergies  Allergen Reactions  . Metformin And Related Other (See Comments)    Kidney problems    Consultations:  Wound care.    Procedures/Studies: Dg Chest 2 View  Result Date: 05/23/2018 CLINICAL DATA:  Fatigue and shortness of breath EXAM: CHEST - 2 VIEW COMPARISON:  03/06/2015 FINDINGS: Atherosclerotic calcification of the aortic arch. Thoracic spondylosis. No blunting of the costophrenic angles. The lungs appear clear. IMPRESSION: 1.  No active cardiopulmonary disease is radiographically apparent. 2.  Aortic Atherosclerosis (ICD10-I70.0). Electronically Signed   By: Paul [name] M.D.   On: 05/23/2018 18:49   Nm Pulmonary Vent And Perf (v/q Scan)  Result Date: 05/24/2018 CLINICAL DATA:  Fatigue, hypoxia exertional shortness of breath. History renal transplantation and renal failure. EXAM: NUCLEAR MEDICINE VENTILATION - PERFUSION LUNG SCAN TECHNIQUE: Ventilation images were obtained in multiple projections using inhaled aerosol Tc-57m DTPA. Perfusion images were obtained in multiple projections after intravenous injection of Tc-35m MAA. RADIOPHARMACEUTICALS:  30.2 mCi of Tc-37m DTPA aerosol inhalation and 4.2 mCi Tc2m MAA IV COMPARISON:  Chest x-ray on 05/23/2018 FINDINGS: Ventilation: Ventilatory imaging demonstrates normal distribution of DTPA aerosol throughout the lungs without significant ventilatory defect identified. There is mild diminished  ventilation at the lung apices, likely reflecting some degree of emphysematous lung disease/obstructive lung disease. Perfusion: Perfusion imaging demonstrates no segmental or subsegmental perfusion defects. IMPRESSION: Normal perfusion of both lungs with no evidence to suggest pulmonary embolism. Ventilatory imaging shows mild diminished ventilation at the lung apices which likely reflects some degree of emphysema/obstructive lung disease. Electronically Signed   By: Aletta Edouard M.D.   On: 05/24/2018 10:00       Subjective: No chest pain or sob, no nausea or vomiting.   Discharge Exam: Vitals:   05/25/18 0900 05/25/18 1048  BP:  114/65  Pulse: 70   Resp: 11   Temp:    SpO2: 100%    Vitals:   05/25/18 0700 05/25/18 0756 05/25/18 0900 05/25/18 1048  BP:  115/65  114/65  Pulse: 63 78 70   Resp: 11 15 11    Temp:  97.6 F (36.4 C)    TempSrc:  Oral    SpO2: 100% 100% 100%   Weight:      Height:        General: Pt  is alert, awake, not in acute distress Cardiovascular: RRR, S1/S2 +, no rubs, no gallops Respiratory: CTA bilaterally, no wheezing, no rhonchi Abdominal: Soft, NT, ND, bowel sounds + Extremities: no edema, no cyanosis    The results of significant diagnostics from this hospitalization (including imaging, microbiology, ancillary and laboratory) are listed below for reference.     Microbiology: Recent Results (from the past 240 hour(s))  Urine culture     Status: None   Collection Time: 05/23/18  7:05 PM  Result Value Ref Range Status   Specimen Description URINE, RANDOM  Final   Special Requests NONE  Final   Culture   Final    NO GROWTH Performed at [facility], [address] [place],  [address]    Report Status 05/25/2018 FINAL  Final  Respiratory Panel by PCR     Status: None   Collection Time: 05/23/18 11:57 PM  Result Value Ref Range Status   Adenovirus NOT DETECTED NOT DETECTED Final   Coronavirus 229E NOT DETECTED NOT DETECTED  Final   Coronavirus HKU1 NOT DETECTED NOT DETECTED Final   Coronavirus NL63 NOT DETECTED NOT DETECTED Final   Coronavirus OC43 NOT DETECTED NOT DETECTED Final   Metapneumovirus NOT DETECTED NOT DETECTED Final   Rhinovirus / Enterovirus NOT DETECTED NOT DETECTED Final   Influenza A NOT DETECTED NOT DETECTED Final   Influenza B NOT DETECTED NOT DETECTED Final   Parainfluenza Virus 1 NOT DETECTED NOT DETECTED Final   Parainfluenza Virus 2 NOT DETECTED NOT DETECTED Final   Parainfluenza Virus 3 NOT DETECTED NOT DETECTED Final   Parainfluenza Virus 4 NOT DETECTED NOT DETECTED Final   Respiratory Syncytial Virus NOT DETECTED NOT DETECTED Final   Bordetella pertussis NOT DETECTED NOT DETECTED Final   Chlamydophila pneumoniae NOT DETECTED NOT DETECTED Final   Mycoplasma pneumoniae NOT DETECTED NOT DETECTED Final     Labs: BNP (last 3 results) No results for input(s): BNP in the last 8760 hours. Basic Metabolic Panel: Recent Labs  Lab 05/23/18 1517 05/24/18 0100 05/24/18 0331 05/25/18 1152  NA 135 136 137 138  K 5.0 4.8 4.7 3.8  CL 117* 118* 119* 112*  CO2 <7* 9* 8* 15*  GLUCOSE 262* 242* 245* 206*  BUN 85* 84* 82* 71*  CREATININE 4.72* 4.88* 4.71* 4.15*  CALCIUM 8.2* 8.0* 8.0* 7.3*  MG  --   --  1.8  --   PHOS  --   --  5.6*  --    Liver Function Tests: Recent Labs  Lab 05/24/18 0331  AST 12*  ALT 10  ALKPHOS 71  BILITOT 0.2*  PROT 5.9*  ALBUMIN 3.0*   No results for input(s): LIPASE, AMYLASE in the last 168 hours. No results for input(s): AMMONIA in the last 168 hours. CBC: Recent Labs  Lab 05/23/18 1517 05/24/18 0544  WBC 13.5* 10.6*  HGB 10.6* 9.1*  HCT 34.0* 27.9*  MCV 89.7 86.9  PLT 232 182   Cardiac Enzymes: No results for input(s): CKTOTAL, CKMB, CKMBINDEX, TROPONINI in the last 168 hours. BNP: Invalid input(s): POCBNP CBG: Recent Labs  Lab 05/24/18 1133 05/24/18 1636 05/24/18 2128 05/25/18 0613 05/25/18 1129  GLUCAP 272* 311* 309* 150* 166*    D-Dimer No results for input(s): DDIMER in the last 72 hours. Hgb A1c Recent Labs    05/24/18 0100  HGBA1C 7.9*   Lipid Profile No results for input(s): CHOL, HDL, LDLCALC, TRIG, CHOLHDL, LDLDIRECT in the last 72 hours. Thyroid function studies Recent  Labs    05/24/18 0331  TSH 2.198   Anemia work up No results for input(s): VITAMINB12, FOLATE, FERRITIN, TIBC, IRON, RETICCTPCT in the last 72 hours. Urinalysis    Component Value Date/Time   COLORURINE YELLOW 05/23/2018 1838   APPEARANCEUR CLEAR 05/23/2018 1838   LABSPEC 1.011 05/23/2018 1838   PHURINE 5.0 05/23/2018 1838   GLUCOSEU 50 (A) 05/23/2018 1838   HGBUR MODERATE (A) 05/23/2018 1838   BILIRUBINUR NEGATIVE 05/23/2018 1838   KETONESUR NEGATIVE 05/23/2018 1838   PROTEINUR 30 (A) 05/23/2018 1838   UROBILINOGEN 0.2 08/02/2014 1424   NITRITE NEGATIVE 05/23/2018 1838   LEUKOCYTESUR NEGATIVE 05/23/2018 1838   Sepsis Labs Invalid input(s): PROCALCITONIN,  WBC,  LACTICIDVEN Microbiology Recent Results (from the past 240 hour(s))  Urine culture     Status: None   Collection Time: 05/23/18  7:05 PM  Result Value Ref Range Status   Specimen Description URINE, RANDOM  Final   Special Requests NONE  Final   Culture   Final    NO GROWTH Performed at [facility], [place] [address] [place], [place] [address]    Report Status 05/25/2018 FINAL  Final  Respiratory Panel by PCR     Status: None   Collection Time: 05/23/18 11:57 PM  Result Value Ref Range Status   Adenovirus NOT DETECTED NOT DETECTED Final   Coronavirus 229E NOT DETECTED NOT DETECTED Final   Coronavirus HKU1 NOT DETECTED NOT DETECTED Final   Coronavirus NL63 NOT DETECTED NOT DETECTED Final   Coronavirus OC43 NOT DETECTED NOT DETECTED Final   Metapneumovirus NOT DETECTED NOT DETECTED Final   Rhinovirus / Enterovirus NOT DETECTED NOT DETECTED Final   Influenza A NOT DETECTED NOT DETECTED Final   Influenza B NOT DETECTED NOT DETECTED Final    Parainfluenza Virus 1 NOT DETECTED NOT DETECTED Final   Parainfluenza Virus 2 NOT DETECTED NOT DETECTED Final   Parainfluenza Virus 3 NOT DETECTED NOT DETECTED Final   Parainfluenza Virus 4 NOT DETECTED NOT DETECTED Final   Respiratory Syncytial Virus NOT DETECTED NOT DETECTED Final   Bordetella pertussis NOT DETECTED NOT DETECTED Final   Chlamydophila pneumoniae NOT DETECTED NOT DETECTED Final   Mycoplasma pneumoniae NOT DETECTED NOT DETECTED Final     Time coordinating discharge: 35 minutes  SIGNED:   Hosie Poisson, MD  [facility] 05/26/2018, 8:32 AM Pager   If 7PM-7AM, please contact night-coverage [email or web address] Password [email or web address]

## 2018-05-27 NOTE — Progress Notes (Signed)
Notified by [facility] ([facility] that they have contracted with [facility] for ordered [facility] needs. [facility] will also contact pt with info [facility] had declined referral per [facility]).

## 2018-05-29 DIAGNOSIS — Z940 Kidney transplant status: Secondary | ICD-10-CM | POA: Diagnosis not present

## 2018-05-29 DIAGNOSIS — N183 Chronic kidney disease, stage 3 (moderate): Secondary | ICD-10-CM | POA: Diagnosis not present

## 2018-05-29 DIAGNOSIS — L89892 Pressure ulcer of other site, stage 2: Secondary | ICD-10-CM | POA: Diagnosis not present

## 2018-05-29 DIAGNOSIS — E1122 Type 2 diabetes mellitus with diabetic chronic kidney disease: Secondary | ICD-10-CM | POA: Diagnosis not present

## 2018-05-29 DIAGNOSIS — I10 Essential (primary) hypertension: Secondary | ICD-10-CM | POA: Diagnosis not present

## 2018-05-29 DIAGNOSIS — Z794 Long term (current) use of insulin: Secondary | ICD-10-CM | POA: Diagnosis not present

## 2018-06-04 DIAGNOSIS — E785 Hyperlipidemia, unspecified: Secondary | ICD-10-CM | POA: Diagnosis not present

## 2018-06-04 DIAGNOSIS — E872 Acidosis: Secondary | ICD-10-CM | POA: Diagnosis not present

## 2018-06-04 DIAGNOSIS — I129 Hypertensive chronic kidney disease with stage 1 through stage 4 chronic kidney disease, or unspecified chronic kidney disease: Secondary | ICD-10-CM | POA: Diagnosis not present

## 2018-06-04 DIAGNOSIS — N2581 Secondary hyperparathyroidism of renal origin: Secondary | ICD-10-CM | POA: Diagnosis not present

## 2018-06-04 DIAGNOSIS — I739 Peripheral vascular disease, unspecified: Secondary | ICD-10-CM | POA: Diagnosis not present

## 2018-06-04 DIAGNOSIS — D631 Anemia in chronic kidney disease: Secondary | ICD-10-CM | POA: Diagnosis not present

## 2018-06-04 DIAGNOSIS — Z940 Kidney transplant status: Secondary | ICD-10-CM | POA: Diagnosis not present

## 2018-06-04 DIAGNOSIS — R011 Cardiac murmur, unspecified: Secondary | ICD-10-CM | POA: Diagnosis not present

## 2018-06-04 DIAGNOSIS — N189 Chronic kidney disease, unspecified: Secondary | ICD-10-CM | POA: Diagnosis not present

## 2018-06-04 DIAGNOSIS — E1129 Type 2 diabetes mellitus with other diabetic kidney complication: Secondary | ICD-10-CM | POA: Diagnosis not present

## 2018-07-15 DIAGNOSIS — T148XXA Other injury of unspecified body region, initial encounter: Secondary | ICD-10-CM | POA: Diagnosis not present

## 2018-08-11 DIAGNOSIS — D631 Anemia in chronic kidney disease: Secondary | ICD-10-CM | POA: Diagnosis not present

## 2018-08-11 DIAGNOSIS — E872 Acidosis: Secondary | ICD-10-CM | POA: Diagnosis not present

## 2018-08-11 DIAGNOSIS — E785 Hyperlipidemia, unspecified: Secondary | ICD-10-CM | POA: Diagnosis not present

## 2018-08-11 DIAGNOSIS — Z940 Kidney transplant status: Secondary | ICD-10-CM | POA: Diagnosis not present

## 2018-08-11 DIAGNOSIS — I739 Peripheral vascular disease, unspecified: Secondary | ICD-10-CM | POA: Diagnosis not present

## 2018-08-11 DIAGNOSIS — N2581 Secondary hyperparathyroidism of renal origin: Secondary | ICD-10-CM | POA: Diagnosis not present

## 2018-08-11 DIAGNOSIS — R011 Cardiac murmur, unspecified: Secondary | ICD-10-CM | POA: Diagnosis not present

## 2018-08-11 DIAGNOSIS — N189 Chronic kidney disease, unspecified: Secondary | ICD-10-CM | POA: Diagnosis not present

## 2018-08-11 DIAGNOSIS — I129 Hypertensive chronic kidney disease with stage 1 through stage 4 chronic kidney disease, or unspecified chronic kidney disease: Secondary | ICD-10-CM | POA: Diagnosis not present

## 2018-08-11 DIAGNOSIS — E1122 Type 2 diabetes mellitus with diabetic chronic kidney disease: Secondary | ICD-10-CM | POA: Diagnosis not present

## 2018-08-18 ENCOUNTER — Other Ambulatory Visit: Payer: Self-pay

## 2018-08-19 ENCOUNTER — Ambulatory Visit (HOSPITAL_COMMUNITY)
Admission: RE | Admit: 2018-08-19 | Discharge: 2018-08-19 | Disposition: A | Discharge status: Home / Self Care | Payer: Managed Care, Other (non HMO) | Source: Ambulatory Visit | Attending: Nephrology | Admitting: Nephrology

## 2018-08-19 VITALS — BP 165/73 | HR 68 | Temp 97.8°F | Resp 20

## 2018-08-19 DIAGNOSIS — D638 Anemia in other chronic diseases classified elsewhere: Secondary | ICD-10-CM | POA: Diagnosis not present

## 2018-08-19 LAB — IRON AND TIBC
Iron: 88 ug/dL (ref 45–182)
Saturation Ratios: 41 % — ABNORMAL HIGH (ref 17.9–39.5)
TIBC: 217 ug/dL — ABNORMAL LOW (ref 250–450)
UIBC: 129 ug/dL

## 2018-08-19 LAB — POCT HEMOGLOBIN-HEMACUE: Hemoglobin: 8.3 g/dL — ABNORMAL LOW (ref 13.0–17.0)

## 2018-08-19 LAB — FERRITIN: Ferritin: 437 ng/mL — ABNORMAL HIGH (ref 24–336)

## 2018-08-19 MED ORDER — DARBEPOETIN ALFA 200 MCG/0.4ML IJ SOSY
PREFILLED_SYRINGE | INTRAMUSCULAR | Status: AC
  Filled 2018-08-19: qty 0.4

## 2018-08-19 MED ORDER — DARBEPOETIN ALFA 200 MCG/0.4ML IJ SOSY
200.0000 ug | PREFILLED_SYRINGE | Freq: Once | INTRAMUSCULAR | Status: AC
  Administered 2018-08-19: 13:00:00 200 ug via SUBCUTANEOUS

## 2018-08-19 MED ORDER — DARBEPOETIN ALFA 25 MCG/0.42ML IJ SOSY
PREFILLED_SYRINGE | INTRAMUSCULAR | Status: AC
  Administered 2018-08-19: 25 ug via SUBCUTANEOUS
  Filled 2018-08-19: qty 0.42

## 2018-08-19 MED ORDER — DARBEPOETIN ALFA 300 MCG/0.6ML IJ SOSY: 225.0000 ug | PREFILLED_SYRINGE | INTRAMUSCULAR | Status: DC

## 2018-08-19 MED ORDER — DARBEPOETIN ALFA 25 MCG/0.42ML IJ SOSY
25.0000 ug | PREFILLED_SYRINGE | Freq: Once | INTRAMUSCULAR | Status: AC
  Administered 2018-08-19: 25 ug via SUBCUTANEOUS

## 2018-09-16 ENCOUNTER — Other Ambulatory Visit: Payer: Self-pay

## 2018-09-16 ENCOUNTER — Ambulatory Visit (HOSPITAL_COMMUNITY)
Admission: RE | Admit: 2018-09-16 | Discharge: 2018-09-16 | Disposition: A | Discharge status: Home / Self Care | Payer: Managed Care, Other (non HMO) | Source: Ambulatory Visit | Attending: Nephrology | Admitting: Nephrology

## 2018-09-16 VITALS — BP 165/67 | HR 69 | Temp 97.9°F | Resp 20

## 2018-09-16 DIAGNOSIS — D638 Anemia in other chronic diseases classified elsewhere: Secondary | ICD-10-CM | POA: Diagnosis not present

## 2018-09-16 LAB — IRON AND TIBC
Iron: 77 ug/dL (ref 45–182)
Saturation Ratios: 35 % (ref 17.9–39.5)
TIBC: 217 ug/dL — ABNORMAL LOW (ref 250–450)
UIBC: 140 ug/dL

## 2018-09-16 LAB — POCT HEMOGLOBIN-HEMACUE: Hemoglobin: 8.6 g/dL — ABNORMAL LOW (ref 13.0–17.0)

## 2018-09-16 LAB — FERRITIN: Ferritin: 502 ng/mL — ABNORMAL HIGH (ref 24–336)

## 2018-09-16 MED ORDER — DARBEPOETIN ALFA 300 MCG/0.6ML IJ SOSY
225.0000 ug | PREFILLED_SYRINGE | INTRAMUSCULAR | Status: DC
  Administered 2018-09-16: 200 ug via SUBCUTANEOUS

## 2018-09-16 MED ORDER — DARBEPOETIN ALFA 25 MCG/0.42ML IJ SOSY
PREFILLED_SYRINGE | INTRAMUSCULAR | Status: AC
  Administered 2018-09-16: 25 ug
  Filled 2018-09-16: qty 0.42

## 2018-09-16 MED ORDER — DARBEPOETIN ALFA 200 MCG/0.4ML IJ SOSY
PREFILLED_SYRINGE | INTRAMUSCULAR | Status: AC
  Administered 2018-09-16: 200 ug via SUBCUTANEOUS
  Filled 2018-09-16: qty 0.4

## 2018-10-05 ENCOUNTER — Other Ambulatory Visit: Payer: Self-pay | Admitting: Nephrology

## 2018-10-05 DIAGNOSIS — Z940 Kidney transplant status: Secondary | ICD-10-CM

## 2018-10-14 ENCOUNTER — Ambulatory Visit (HOSPITAL_COMMUNITY)
Admission: RE | Admit: 2018-10-14 | Discharge: 2018-10-14 | Disposition: A | Discharge status: Home / Self Care | Payer: Managed Care, Other (non HMO) | Source: Ambulatory Visit | Attending: Nephrology | Admitting: Nephrology

## 2018-10-14 ENCOUNTER — Other Ambulatory Visit: Payer: Self-pay

## 2018-10-14 DIAGNOSIS — D638 Anemia in other chronic diseases classified elsewhere: Secondary | ICD-10-CM | POA: Diagnosis present

## 2018-10-14 LAB — IRON AND TIBC
Iron: 98 ug/dL (ref 45–182)
Saturation Ratios: 46 % — ABNORMAL HIGH (ref 17.9–39.5)
TIBC: 214 ug/dL — ABNORMAL LOW (ref 250–450)
UIBC: 116 ug/dL

## 2018-10-14 LAB — POCT HEMOGLOBIN-HEMACUE: Hemoglobin: 9.3 g/dL — ABNORMAL LOW (ref 13.0–17.0)

## 2018-10-14 LAB — FERRITIN: Ferritin: 571 ng/mL — ABNORMAL HIGH (ref 24–336)

## 2018-10-14 MED ORDER — DARBEPOETIN ALFA 300 MCG/0.6ML IJ SOSY: 225.0000 ug | PREFILLED_SYRINGE | INTRAMUSCULAR | Status: DC

## 2018-10-14 MED ORDER — DARBEPOETIN ALFA 200 MCG/0.4ML IJ SOSY
PREFILLED_SYRINGE | INTRAMUSCULAR | Status: AC
  Administered 2018-10-14: 200 ug via SUBCUTANEOUS
  Filled 2018-10-14: qty 0.4

## 2018-10-14 MED ORDER — DARBEPOETIN ALFA 25 MCG/0.42ML IJ SOSY
PREFILLED_SYRINGE | INTRAMUSCULAR | Status: AC
  Administered 2018-10-14: 25 ug via SUBCUTANEOUS
  Filled 2018-10-14: qty 0.42

## 2018-10-28 ENCOUNTER — Encounter (HOSPITAL_COMMUNITY)
Admission: EM | Disposition: A | Discharge status: Home Health | Payer: Self-pay | Source: Home / Self Care | Attending: Internal Medicine

## 2018-10-28 ENCOUNTER — Inpatient Hospital Stay (HOSPITAL_COMMUNITY): Payer: Managed Care, Other (non HMO) | Admitting: Anesthesiology

## 2018-10-28 ENCOUNTER — Emergency Department (HOSPITAL_COMMUNITY): Payer: Managed Care, Other (non HMO)

## 2018-10-28 ENCOUNTER — Inpatient Hospital Stay (HOSPITAL_COMMUNITY)
Admission: EM | Admit: 2018-10-28 | Discharge: 2018-11-04 | DRG: 637 | Disposition: A | Discharge status: Home Health | Payer: Managed Care, Other (non HMO) | Attending: Internal Medicine | Admitting: Internal Medicine

## 2018-10-28 ENCOUNTER — Inpatient Hospital Stay (HOSPITAL_COMMUNITY): Payer: Managed Care, Other (non HMO)

## 2018-10-28 ENCOUNTER — Emergency Department (HOSPITAL_COMMUNITY): Admission: EM | Admit: 2018-10-28 | Payer: Self-pay | Source: Home / Self Care

## 2018-10-28 DIAGNOSIS — Z85068 Personal history of other malignant neoplasm of small intestine: Secondary | ICD-10-CM

## 2018-10-28 DIAGNOSIS — D649 Anemia, unspecified: Secondary | ICD-10-CM | POA: Diagnosis present

## 2018-10-28 DIAGNOSIS — Z888 Allergy status to other drugs, medicaments and biological substances status: Secondary | ICD-10-CM

## 2018-10-28 DIAGNOSIS — R4189 Other symptoms and signs involving cognitive functions and awareness: Secondary | ICD-10-CM | POA: Diagnosis not present

## 2018-10-28 DIAGNOSIS — Z7982 Long term (current) use of aspirin: Secondary | ICD-10-CM | POA: Diagnosis not present

## 2018-10-28 DIAGNOSIS — K3184 Gastroparesis: Secondary | ICD-10-CM | POA: Diagnosis present

## 2018-10-28 DIAGNOSIS — I482 Chronic atrial fibrillation, unspecified: Secondary | ICD-10-CM | POA: Diagnosis present

## 2018-10-28 DIAGNOSIS — R29810 Facial weakness: Secondary | ICD-10-CM | POA: Diagnosis present

## 2018-10-28 DIAGNOSIS — Z8249 Family history of ischemic heart disease and other diseases of the circulatory system: Secondary | ICD-10-CM

## 2018-10-28 DIAGNOSIS — E1051 Type 1 diabetes mellitus with diabetic peripheral angiopathy without gangrene: Secondary | ICD-10-CM | POA: Diagnosis present

## 2018-10-28 DIAGNOSIS — R404 Transient alteration of awareness: Secondary | ICD-10-CM | POA: Diagnosis not present

## 2018-10-28 DIAGNOSIS — H5589 Other irregular eye movements: Secondary | ICD-10-CM | POA: Diagnosis not present

## 2018-10-28 DIAGNOSIS — D631 Anemia in chronic kidney disease: Secondary | ICD-10-CM | POA: Diagnosis present

## 2018-10-28 DIAGNOSIS — R269 Unspecified abnormalities of gait and mobility: Secondary | ICD-10-CM

## 2018-10-28 DIAGNOSIS — Z940 Kidney transplant status: Secondary | ICD-10-CM | POA: Diagnosis not present

## 2018-10-28 DIAGNOSIS — Z1159 Encounter for screening for other viral diseases: Secondary | ICD-10-CM | POA: Diagnosis not present

## 2018-10-28 DIAGNOSIS — F329 Major depressive disorder, single episode, unspecified: Secondary | ICD-10-CM | POA: Diagnosis present

## 2018-10-28 DIAGNOSIS — G9341 Metabolic encephalopathy: Secondary | ICD-10-CM

## 2018-10-28 DIAGNOSIS — K567 Ileus, unspecified: Secondary | ICD-10-CM | POA: Diagnosis present

## 2018-10-28 DIAGNOSIS — N179 Acute kidney failure, unspecified: Secondary | ICD-10-CM | POA: Diagnosis present

## 2018-10-28 DIAGNOSIS — I6602 Occlusion and stenosis of left middle cerebral artery: Secondary | ICD-10-CM | POA: Diagnosis present

## 2018-10-28 DIAGNOSIS — E119 Type 2 diabetes mellitus without complications: Secondary | ICD-10-CM

## 2018-10-28 DIAGNOSIS — Z992 Dependence on renal dialysis: Secondary | ICD-10-CM

## 2018-10-28 DIAGNOSIS — R29719 NIHSS score 19: Secondary | ICD-10-CM | POA: Diagnosis present

## 2018-10-28 DIAGNOSIS — R4701 Aphasia: Secondary | ICD-10-CM | POA: Diagnosis present

## 2018-10-28 DIAGNOSIS — E1022 Type 1 diabetes mellitus with diabetic chronic kidney disease: Secondary | ICD-10-CM | POA: Diagnosis present

## 2018-10-28 DIAGNOSIS — E1065 Type 1 diabetes mellitus with hyperglycemia: Secondary | ICD-10-CM | POA: Diagnosis not present

## 2018-10-28 DIAGNOSIS — K219 Gastro-esophageal reflux disease without esophagitis: Secondary | ICD-10-CM | POA: Diagnosis present

## 2018-10-28 DIAGNOSIS — R627 Adult failure to thrive: Secondary | ICD-10-CM | POA: Diagnosis present

## 2018-10-28 DIAGNOSIS — N184 Chronic kidney disease, stage 4 (severe): Secondary | ICD-10-CM | POA: Diagnosis present

## 2018-10-28 DIAGNOSIS — E10649 Type 1 diabetes mellitus with hypoglycemia without coma: Secondary | ICD-10-CM | POA: Diagnosis not present

## 2018-10-28 DIAGNOSIS — R509 Fever, unspecified: Secondary | ICD-10-CM | POA: Diagnosis not present

## 2018-10-28 DIAGNOSIS — G8194 Hemiplegia, unspecified affecting left nondominant side: Secondary | ICD-10-CM | POA: Diagnosis not present

## 2018-10-28 DIAGNOSIS — F32A Depression, unspecified: Secondary | ICD-10-CM | POA: Diagnosis present

## 2018-10-28 DIAGNOSIS — Z79899 Other long term (current) drug therapy: Secondary | ICD-10-CM

## 2018-10-28 DIAGNOSIS — E1165 Type 2 diabetes mellitus with hyperglycemia: Secondary | ICD-10-CM | POA: Diagnosis not present

## 2018-10-28 DIAGNOSIS — Z905 Acquired absence of kidney: Secondary | ICD-10-CM | POA: Diagnosis not present

## 2018-10-28 DIAGNOSIS — R739 Hyperglycemia, unspecified: Secondary | ICD-10-CM | POA: Diagnosis not present

## 2018-10-28 DIAGNOSIS — Z978 Presence of other specified devices: Secondary | ICD-10-CM

## 2018-10-28 DIAGNOSIS — N186 End stage renal disease: Secondary | ICD-10-CM | POA: Diagnosis present

## 2018-10-28 DIAGNOSIS — R1115 Cyclical vomiting syndrome unrelated to migraine: Secondary | ICD-10-CM

## 2018-10-28 DIAGNOSIS — Z96653 Presence of artificial knee joint, bilateral: Secondary | ICD-10-CM | POA: Diagnosis present

## 2018-10-28 DIAGNOSIS — Z4682 Encounter for fitting and adjustment of non-vascular catheter: Secondary | ICD-10-CM | POA: Diagnosis not present

## 2018-10-28 DIAGNOSIS — I63512 Cerebral infarction due to unspecified occlusion or stenosis of left middle cerebral artery: Secondary | ICD-10-CM | POA: Diagnosis not present

## 2018-10-28 DIAGNOSIS — E1043 Type 1 diabetes mellitus with diabetic autonomic (poly)neuropathy: Secondary | ICD-10-CM | POA: Diagnosis present

## 2018-10-28 DIAGNOSIS — I471 Supraventricular tachycardia: Secondary | ICD-10-CM | POA: Diagnosis not present

## 2018-10-28 DIAGNOSIS — N200 Calculus of kidney: Secondary | ICD-10-CM | POA: Diagnosis not present

## 2018-10-28 DIAGNOSIS — I10 Essential (primary) hypertension: Secondary | ICD-10-CM | POA: Diagnosis not present

## 2018-10-28 DIAGNOSIS — I129 Hypertensive chronic kidney disease with stage 1 through stage 4 chronic kidney disease, or unspecified chronic kidney disease: Secondary | ICD-10-CM | POA: Diagnosis present

## 2018-10-28 DIAGNOSIS — R471 Dysarthria and anarthria: Secondary | ICD-10-CM | POA: Diagnosis present

## 2018-10-28 DIAGNOSIS — I639 Cerebral infarction, unspecified: Secondary | ICD-10-CM | POA: Diagnosis present

## 2018-10-28 DIAGNOSIS — Z7952 Long term (current) use of systemic steroids: Secondary | ICD-10-CM

## 2018-10-28 DIAGNOSIS — T383X6A Underdosing of insulin and oral hypoglycemic [antidiabetic] drugs, initial encounter: Secondary | ICD-10-CM | POA: Diagnosis present

## 2018-10-28 DIAGNOSIS — Z7983 Long term (current) use of bisphosphonates: Secondary | ICD-10-CM

## 2018-10-28 DIAGNOSIS — Z794 Long term (current) use of insulin: Secondary | ICD-10-CM

## 2018-10-28 DIAGNOSIS — Z8042 Family history of malignant neoplasm of prostate: Secondary | ICD-10-CM

## 2018-10-28 DIAGNOSIS — I6300 Cerebral infarction due to thrombosis of unspecified precerebral artery: Secondary | ICD-10-CM | POA: Diagnosis not present

## 2018-10-28 DIAGNOSIS — E785 Hyperlipidemia, unspecified: Secondary | ICD-10-CM | POA: Diagnosis present

## 2018-10-28 DIAGNOSIS — R410 Disorientation, unspecified: Secondary | ICD-10-CM | POA: Diagnosis not present

## 2018-10-28 DIAGNOSIS — R29818 Other symptoms and signs involving the nervous system: Secondary | ICD-10-CM | POA: Diagnosis not present

## 2018-10-28 DIAGNOSIS — E876 Hypokalemia: Secondary | ICD-10-CM | POA: Diagnosis present

## 2018-10-28 DIAGNOSIS — G8191 Hemiplegia, unspecified affecting right dominant side: Secondary | ICD-10-CM | POA: Diagnosis present

## 2018-10-28 DIAGNOSIS — I480 Paroxysmal atrial fibrillation: Secondary | ICD-10-CM | POA: Diagnosis not present

## 2018-10-28 DIAGNOSIS — J9600 Acute respiratory failure, unspecified whether with hypoxia or hypercapnia: Secondary | ICD-10-CM

## 2018-10-28 DIAGNOSIS — Z8719 Personal history of other diseases of the digestive system: Secondary | ICD-10-CM

## 2018-10-28 DIAGNOSIS — R4781 Slurred speech: Secondary | ICD-10-CM | POA: Diagnosis not present

## 2018-10-28 DIAGNOSIS — H518 Other specified disorders of binocular movement: Secondary | ICD-10-CM | POA: Diagnosis present

## 2018-10-28 DIAGNOSIS — K6389 Other specified diseases of intestine: Secondary | ICD-10-CM | POA: Diagnosis not present

## 2018-10-28 DIAGNOSIS — Z85038 Personal history of other malignant neoplasm of large intestine: Secondary | ICD-10-CM

## 2018-10-28 DIAGNOSIS — I120 Hypertensive chronic kidney disease with stage 5 chronic kidney disease or end stage renal disease: Secondary | ICD-10-CM | POA: Diagnosis present

## 2018-10-28 DIAGNOSIS — Z823 Family history of stroke: Secondary | ICD-10-CM

## 2018-10-28 DIAGNOSIS — E1169 Type 2 diabetes mellitus with other specified complication: Secondary | ICD-10-CM | POA: Diagnosis not present

## 2018-10-28 HISTORY — PX: IR ANGIO VERTEBRAL SEL SUBCLAVIAN INNOMINATE UNI R MOD SED: IMG5365

## 2018-10-28 HISTORY — PX: IR ANGIO INTRA EXTRACRAN SEL COM CAROTID INNOMINATE BILAT MOD SED: IMG5360

## 2018-10-28 HISTORY — PX: IR ANGIO VERTEBRAL SEL VERTEBRAL UNI L MOD SED: IMG5367

## 2018-10-28 HISTORY — PX: RADIOLOGY WITH ANESTHESIA: SHX6223

## 2018-10-28 LAB — DIFFERENTIAL
Abs Immature Granulocytes: 0.04 10*3/uL (ref 0.00–0.07)
Basophils Absolute: 0 10*3/uL (ref 0.0–0.1)
Basophils Relative: 0 %
Eosinophils Absolute: 0.1 10*3/uL (ref 0.0–0.5)
Eosinophils Relative: 1 %
Immature Granulocytes: 0 %
Lymphocytes Relative: 17 %
Lymphs Abs: 1.6 10*3/uL (ref 0.7–4.0)
Monocytes Absolute: 0.5 10*3/uL (ref 0.1–1.0)
Monocytes Relative: 5 %
Neutro Abs: 7.1 10*3/uL (ref 1.7–7.7)
Neutrophils Relative %: 77 %

## 2018-10-28 LAB — COMPREHENSIVE METABOLIC PANEL
ALT: 12 U/L (ref 0–44)
AST: 11 U/L — ABNORMAL LOW (ref 15–41)
Albumin: 3.3 g/dL — ABNORMAL LOW (ref 3.5–5.0)
Alkaline Phosphatase: 57 U/L (ref 38–126)
Anion gap: 12 (ref 5–15)
BUN: 32 mg/dL — ABNORMAL HIGH (ref 8–23)
CO2: 20 mmol/L — ABNORMAL LOW (ref 22–32)
Calcium: 9.7 mg/dL (ref 8.9–10.3)
Chloride: 96 mmol/L — ABNORMAL LOW (ref 98–111)
Creatinine, Ser: 3.31 mg/dL — ABNORMAL HIGH (ref 0.61–1.24)
GFR calc Af Amer: 21 mL/min — ABNORMAL LOW (ref >60)
GFR calc non Af Amer: 18 mL/min — ABNORMAL LOW (ref >60)
Glucose, Bld: 790 mg/dL (ref 70–99)
Potassium: 4.2 mmol/L (ref 3.5–5.1)
Sodium: 128 mmol/L — ABNORMAL LOW (ref 135–145)
Total Bilirubin: 0.9 mg/dL (ref 0.3–1.2)
Total Protein: 5.3 g/dL — ABNORMAL LOW (ref 6.5–8.1)

## 2018-10-28 LAB — CBC
HCT: 30.8 % — ABNORMAL LOW (ref 39.0–52.0)
Hemoglobin: 9.9 g/dL — ABNORMAL LOW (ref 13.0–17.0)
MCH: 28.1 pg (ref 26.0–34.0)
MCHC: 32.1 g/dL (ref 30.0–36.0)
MCV: 87.5 fL (ref 80.0–100.0)
Platelets: 123 10*3/uL — ABNORMAL LOW (ref 150–400)
RBC: 3.52 MIL/uL — ABNORMAL LOW (ref 4.22–5.81)
RDW: 13.8 % (ref 11.5–15.5)
WBC: 9.4 10*3/uL (ref 4.0–10.5)
nRBC: 0 % (ref 0.0–0.2)

## 2018-10-28 LAB — BASIC METABOLIC PANEL
Anion gap: 12 (ref 5–15)
BUN: 34 mg/dL — ABNORMAL HIGH (ref 8–23)
CO2: 17 mmol/L — ABNORMAL LOW (ref 22–32)
Calcium: 8.9 mg/dL (ref 8.9–10.3)
Chloride: 105 mmol/L (ref 98–111)
Creatinine, Ser: 3.15 mg/dL — ABNORMAL HIGH (ref 0.61–1.24)
GFR calc Af Amer: 23 mL/min — ABNORMAL LOW (ref >60)
GFR calc non Af Amer: 20 mL/min — ABNORMAL LOW (ref >60)
Glucose, Bld: 182 mg/dL — ABNORMAL HIGH (ref 70–99)
Potassium: 3.1 mmol/L — ABNORMAL LOW (ref 3.5–5.1)
Sodium: 134 mmol/L — ABNORMAL LOW (ref 135–145)

## 2018-10-28 LAB — GLUCOSE, RANDOM: Glucose, Bld: 656 mg/dL (ref 70–99)

## 2018-10-28 LAB — MAGNESIUM: Magnesium: 1.1 mg/dL — ABNORMAL LOW (ref 1.7–2.4)

## 2018-10-28 LAB — CBG MONITORING, ED: Glucose-Capillary: >600 mg/dL (ref 70–99)

## 2018-10-28 LAB — GLUCOSE, CAPILLARY
Glucose-Capillary: 472 mg/dL — ABNORMAL HIGH (ref 70–99)
Glucose-Capillary: 350 mg/dL — ABNORMAL HIGH (ref 70–99)
Glucose-Capillary: 274 mg/dL — ABNORMAL HIGH (ref 70–99)
Glucose-Capillary: 163 mg/dL — ABNORMAL HIGH (ref 70–99)
Glucose-Capillary: 160 mg/dL — ABNORMAL HIGH (ref 70–99)

## 2018-10-28 LAB — AMYLASE: Amylase: 28 U/L (ref 28–100)

## 2018-10-28 LAB — APTT: aPTT: 25 seconds (ref 24–36)

## 2018-10-28 LAB — PROTIME-INR
INR: 1.2 (ref 0.8–1.2)
Prothrombin Time: 14.7 seconds (ref 11.4–15.2)

## 2018-10-28 LAB — MRSA PCR SCREENING: MRSA by PCR: NEGATIVE

## 2018-10-28 LAB — LIPASE, BLOOD: Lipase: 23 U/L (ref 11–51)

## 2018-10-28 LAB — SARS CORONAVIRUS 2: SARS Coronavirus 2: NOT DETECTED

## 2018-10-28 SURGERY — IR WITH ANESTHESIA
Anesthesia: Choice

## 2018-10-28 MED ORDER — INSULIN GLARGINE 100 UNIT/ML ~~LOC~~ SOLN
20.0000 [IU] | Freq: Every day | SUBCUTANEOUS | Status: DC
  Filled 2018-10-28: qty 0.2

## 2018-10-28 MED ORDER — LIDOCAINE HCL 1 % IJ SOLN
INTRAMUSCULAR | Status: AC
  Filled 2018-10-28: qty 20

## 2018-10-28 MED ORDER — DEXTROSE 10 % IV SOLN
INTRAVENOUS | Status: DC
  Administered 2018-10-28: 23:00:00 via INTRAVENOUS

## 2018-10-28 MED ORDER — ASPIRIN 81 MG PO CHEW
CHEWABLE_TABLET | ORAL | Status: AC
  Filled 2018-10-28: qty 1

## 2018-10-28 MED ORDER — SODIUM CHLORIDE 0.9 % IV SOLN
INTRAVENOUS | Status: DC
  Administered 2018-10-28 (×2): via INTRAVENOUS

## 2018-10-28 MED ORDER — SODIUM CHLORIDE 0.9 % IV SOLN
INTRAVENOUS | Status: DC
  Administered 2018-10-28 – 2018-10-30 (×3): via INTRAVENOUS

## 2018-10-28 MED ORDER — ADENOSINE 6 MG/2ML IV SOLN
INTRAVENOUS | Status: AC
  Filled 2018-10-28: qty 2

## 2018-10-28 MED ORDER — SODIUM CHLORIDE 0.9% FLUSH: 3.0000 mL | Freq: Once | INTRAVENOUS | Status: DC

## 2018-10-28 MED ORDER — FENTANYL CITRATE (PF) 100 MCG/2ML IJ SOLN: 25.0000 ug | INTRAMUSCULAR | Status: DC | PRN

## 2018-10-28 MED ORDER — SODIUM CHLORIDE 0.9 % IV SOLN
50.0000 mL | Freq: Once | INTRAVENOUS | Status: AC
  Administered 2018-10-28: 50 mL via INTRAVENOUS

## 2018-10-28 MED ORDER — ACETAMINOPHEN 325 MG PO TABS: 650.0000 mg | ORAL_TABLET | ORAL | Status: DC | PRN

## 2018-10-28 MED ORDER — DOCUSATE SODIUM 50 MG/5ML PO LIQD
100.0000 mg | Freq: Two times a day (BID) | ORAL | Status: DC | PRN
  Filled 2018-10-28: qty 10

## 2018-10-28 MED ORDER — INSULIN REGULAR(HUMAN) IN NACL 100-0.9 UT/100ML-% IV SOLN
INTRAVENOUS | Status: DC
  Administered 2018-10-28: 5.4 [IU]/h via INTRAVENOUS
  Filled 2018-10-28: qty 100

## 2018-10-28 MED ORDER — SENNOSIDES-DOCUSATE SODIUM 8.6-50 MG PO TABS: 1.0000 | ORAL_TABLET | Freq: Every evening | ORAL | Status: DC | PRN

## 2018-10-28 MED ORDER — SUCCINYLCHOLINE CHLORIDE 20 MG/ML IJ SOLN
INTRAMUSCULAR | Status: DC | PRN
  Administered 2018-10-28: 100 mg via INTRAVENOUS

## 2018-10-28 MED ORDER — AMIODARONE HCL IN DEXTROSE 360-4.14 MG/200ML-% IV SOLN
60.0000 mg/h | INTRAVENOUS | Status: AC
  Administered 2018-10-28: 60 mg/h via INTRAVENOUS
  Filled 2018-10-28 (×2): qty 200

## 2018-10-28 MED ORDER — FENTANYL CITRATE (PF) 100 MCG/2ML IJ SOLN
INTRAMUSCULAR | Status: AC
  Administered 2018-10-28: 100 ug
  Filled 2018-10-28: qty 2

## 2018-10-28 MED ORDER — NITROGLYCERIN 1 MG/10 ML FOR IR/CATH LAB
INTRA_ARTERIAL | Status: AC
  Filled 2018-10-28: qty 10

## 2018-10-28 MED ORDER — EPTIFIBATIDE 20 MG/10ML IV SOLN
INTRAVENOUS | Status: AC
  Filled 2018-10-28: qty 10

## 2018-10-28 MED ORDER — TACROLIMUS 1 MG PO CAPS
4.0000 mg | ORAL_CAPSULE | ORAL | Status: DC
  Administered 2018-10-31 – 2018-11-04 (×5): 4 mg via ORAL
  Filled 2018-10-28 (×6): qty 4

## 2018-10-28 MED ORDER — CEFAZOLIN SODIUM-DEXTROSE 2-4 GM/100ML-% IV SOLN
INTRAVENOUS | Status: AC
  Filled 2018-10-28: qty 100

## 2018-10-28 MED ORDER — ROCURONIUM BROMIDE 50 MG/5ML IV SOSY
PREFILLED_SYRINGE | INTRAVENOUS | Status: DC | PRN
  Administered 2018-10-28: 50 mg via INTRAVENOUS

## 2018-10-28 MED ORDER — ACETAMINOPHEN 160 MG/5ML PO SOLN: 650.0000 mg | ORAL | Status: DC | PRN

## 2018-10-28 MED ORDER — AMIODARONE IV BOLUS ONLY 150 MG/100ML: 150.0000 mg | Freq: Once | INTRAVENOUS | Status: DC

## 2018-10-28 MED ORDER — TACROLIMUS 1 MG PO CAPS: 3.0000 mg | ORAL_CAPSULE | Freq: Two times a day (BID) | ORAL | Status: DC

## 2018-10-28 MED ORDER — PROPOFOL 10 MG/ML IV BOLUS
INTRAVENOUS | Status: DC | PRN
  Administered 2018-10-28: 130 mg via INTRAVENOUS

## 2018-10-28 MED ORDER — ONDANSETRON HCL 4 MG/2ML IJ SOLN
INTRAMUSCULAR | Status: DC | PRN
  Administered 2018-10-28: 4 mg via INTRAVENOUS

## 2018-10-28 MED ORDER — ATORVASTATIN CALCIUM 10 MG PO TABS
10.0000 mg | ORAL_TABLET | Freq: Every day | ORAL | Status: DC
  Administered 2018-10-31 – 2018-11-04 (×5): 10 mg via ORAL
  Filled 2018-10-28 (×5): qty 1

## 2018-10-28 MED ORDER — MIDAZOLAM HCL 2 MG/2ML IJ SOLN: 1.0000 mg | INTRAMUSCULAR | Status: DC | PRN

## 2018-10-28 MED ORDER — TICAGRELOR 90 MG PO TABS
ORAL_TABLET | ORAL | Status: AC
  Filled 2018-10-28: qty 2

## 2018-10-28 MED ORDER — SODIUM CHLORIDE 0.9 % IV BOLUS
1000.0000 mL | Freq: Once | INTRAVENOUS | Status: AC
  Administered 2018-10-28: 1000 mL via INTRAVENOUS

## 2018-10-28 MED ORDER — AMIODARONE HCL IN DEXTROSE 360-4.14 MG/200ML-% IV SOLN
30.0000 mg/h | INTRAVENOUS | Status: DC
  Administered 2018-10-29 – 2018-10-30 (×4): 30 mg/h via INTRAVENOUS
  Filled 2018-10-28 (×5): qty 200

## 2018-10-28 MED ORDER — INSULIN REGULAR(HUMAN) IN NACL 100-0.9 UT/100ML-% IV SOLN
INTRAVENOUS | Status: DC
  Administered 2018-10-29: 4.6 [IU]/h via INTRAVENOUS
  Filled 2018-10-28: qty 100

## 2018-10-28 MED ORDER — ONDANSETRON HCL 4 MG/2ML IJ SOLN
4.0000 mg | Freq: Four times a day (QID) | INTRAMUSCULAR | Status: DC | PRN
  Administered 2018-10-28 – 2018-10-29 (×2): 4 mg via INTRAVENOUS
  Filled 2018-10-28 (×2): qty 2

## 2018-10-28 MED ORDER — INSULIN ASPART 100 UNIT/ML ~~LOC~~ SOLN: 2.0000 [IU] | SUBCUTANEOUS | Status: DC

## 2018-10-28 MED ORDER — ACETAMINOPHEN 650 MG RE SUPP: 650.0000 mg | RECTAL | Status: DC | PRN

## 2018-10-28 MED ORDER — CEFAZOLIN SODIUM-DEXTROSE 2-3 GM-%(50ML) IV SOLR
INTRAVENOUS | Status: DC | PRN
  Administered 2018-10-28: 2 g via INTRAVENOUS

## 2018-10-28 MED ORDER — TACROLIMUS 1 MG PO CAPS
3.0000 mg | ORAL_CAPSULE | ORAL | Status: DC
  Administered 2018-10-31 – 2018-11-03 (×4): 3 mg via ORAL
  Filled 2018-10-28 (×8): qty 3

## 2018-10-28 MED ORDER — AMLODIPINE BESYLATE 10 MG PO TABS
10.0000 mg | ORAL_TABLET | Freq: Every day | ORAL | Status: DC
  Administered 2018-10-31 – 2018-11-04 (×5): 10 mg via ORAL
  Filled 2018-10-28 (×2): qty 1
  Filled 2018-10-28: qty 2
  Filled 2018-10-28 (×2): qty 1

## 2018-10-28 MED ORDER — IOHEXOL 300 MG/ML  SOLN
50.0000 mL | Freq: Once | INTRAMUSCULAR | Status: AC | PRN
  Administered 2018-10-28: 15:00:00 15 mL via INTRA_ARTERIAL

## 2018-10-28 MED ORDER — AMIODARONE LOAD VIA INFUSION
150.0000 mg | Freq: Once | INTRAVENOUS | Status: AC
  Administered 2018-10-29: 150 mg via INTRAVENOUS
  Filled 2018-10-28: qty 83.34

## 2018-10-28 MED ORDER — CLEVIDIPINE BUTYRATE 0.5 MG/ML IV EMUL
0.0000 mg/h | INTRAVENOUS | Status: AC
  Administered 2018-10-28: 1 mg/h via INTRAVENOUS
  Administered 2018-10-28: 2 mg/h via INTRAVENOUS
  Administered 2018-10-29: 4 mg/h via INTRAVENOUS
  Administered 2018-10-29: 5 mg/h via INTRAVENOUS
  Filled 2018-10-28 (×2): qty 50

## 2018-10-28 MED ORDER — CLOPIDOGREL BISULFATE 300 MG PO TABS
ORAL_TABLET | ORAL | Status: AC
  Filled 2018-10-28: qty 1

## 2018-10-28 MED ORDER — MAGNESIUM SULFATE 2 GM/50ML IV SOLN
2.0000 g | Freq: Once | INTRAVENOUS | Status: AC
  Administered 2018-10-29: 2 g via INTRAVENOUS
  Filled 2018-10-28: qty 50

## 2018-10-28 MED ORDER — AMIODARONE LOAD VIA INFUSION
150.0000 mg | Freq: Once | INTRAVENOUS | Status: AC
  Administered 2018-10-28: 150 mg via INTRAVENOUS
  Filled 2018-10-28: qty 83.34

## 2018-10-28 MED ORDER — ALTEPLASE (STROKE) FULL DOSE INFUSION
0.9000 mg/kg | Freq: Once | INTRAVENOUS | Status: AC
  Administered 2018-10-28: 64.4 mg via INTRAVENOUS
  Filled 2018-10-28: qty 100

## 2018-10-28 MED ORDER — PANTOPRAZOLE SODIUM 40 MG IV SOLR
40.0000 mg | Freq: Every day | INTRAVENOUS | Status: DC
  Administered 2018-10-28 – 2018-10-30 (×3): 40 mg via INTRAVENOUS
  Filled 2018-10-28 (×2): qty 40

## 2018-10-28 MED ORDER — ACETAMINOPHEN 650 MG RE SUPP
650.0000 mg | RECTAL | Status: DC | PRN
  Administered 2018-10-29: 650 mg via RECTAL
  Filled 2018-10-28: qty 1

## 2018-10-28 MED ORDER — HYDRALAZINE HCL 20 MG/ML IJ SOLN
INTRAMUSCULAR | Status: DC | PRN
  Administered 2018-10-28 (×2): 5 mg via INTRAVENOUS

## 2018-10-28 MED ORDER — ORAL CARE MOUTH RINSE
15.0000 mL | Freq: Two times a day (BID) | OROMUCOSAL | Status: DC
  Administered 2018-10-28 – 2018-11-04 (×11): 15 mL via OROMUCOSAL

## 2018-10-28 MED ORDER — SUGAMMADEX SODIUM 200 MG/2ML IV SOLN
INTRAVENOUS | Status: DC | PRN
  Administered 2018-10-28: 200 mg via INTRAVENOUS

## 2018-10-28 MED ORDER — CLEVIDIPINE BUTYRATE 0.5 MG/ML IV EMUL
INTRAVENOUS | Status: AC
  Filled 2018-10-28: qty 50

## 2018-10-28 MED ORDER — IOHEXOL 300 MG/ML  SOLN
150.0000 mL | Freq: Once | INTRAMUSCULAR | Status: AC | PRN
  Administered 2018-10-28: 80 mL via INTRA_ARTERIAL

## 2018-10-28 MED ORDER — FENTANYL CITRATE (PF) 100 MCG/2ML IJ SOLN
INTRAMUSCULAR | Status: DC | PRN
  Administered 2018-10-28: 100 ug via INTRAVENOUS

## 2018-10-28 MED ORDER — TIROFIBAN HCL IN NACL 5-0.9 MG/100ML-% IV SOLN
INTRAVENOUS | Status: AC
  Filled 2018-10-28: qty 100

## 2018-10-28 MED ORDER — STROKE: EARLY STAGES OF RECOVERY BOOK
Freq: Once | Status: AC
  Administered 2018-10-28: 18:00:00
  Filled 2018-10-28: qty 1

## 2018-10-28 NOTE — ED Triage Notes (Signed)
Pt arrives via EMS from home with reports of aphasia and right side weakness at 12:16 today. Wife reports she noticed him not talking and staring off.

## 2018-10-28 NOTE — Progress Notes (Signed)
Cleaning patient up 2130 when patient HR went up to 180. Zoll pads applied to patient. [place] notified. ECG obtained. Patient found to be in rapid afib. Bmp and mag ordered. K dropped to 3.1 Som[place] MD notified. Waiting for response.

## 2018-10-28 NOTE — H&P (Signed)
Neurology Consultation  Reason for Consult: code stroke Referring Physician: ED MD  CC:   History is obtained from: Wife who I spoke to over the phone.  HPI: Paul [name] is a 63 y.o. male history of bilateral amputation above knee, hypertension, blood transfusion, end-stage renal disease and currently on the kidney transplant recipient list.  Patient was noted to be his usual self at 1030 last night per wife.  Morning at 1130 patient was not speaking to the wife and staring out in the distance, at 1216 she noted aphasia.  Wife went upstairs and saw patient sitting in the closet on the floor looking up at her but was unable to speak.  He apparently has a hemianopsia on the right per EMS.  Patient was immediately [facility] where he was unable to give any history secondary to being a phasic.  Per EMS his blood glucose registered above what the meter could obtain.  The patient's wife called his boss who stated that the patient started a meeting at 57 AM was fine currently but midway through the meeting was unable to speak and move his right hand and the meeting had to be terminated early..   ED course: Labs obtained, CT of head obtained which shows aspect score of 10 without any acute abnormality.   LKW: At this point no clear-cut last seen normal tpa given?:  Yes Premorbid modified Rankin scale (mRS): 2.  No neurological deficits but walks bilateral prosthetic feet due to amputations NIH stroke scale 19  ROS: Unable to obtain due to altered mental status.   Past Medical History:  Diagnosis Date  . A-fib ([place])   . Anemia    esrd  . Calciphylaxis 05/2013   lower ext  . Cancer ([place])    hx duodenal adenoCa 2002, resected  . Closed left arm fracture 1967  . Corneal abrasion, left   . Depression   . Diabetes mellitus    Type 1 iddm x 11 yrs  . Diabetic neuropathy ([place])   . ESRD on hemodialysis ([place])    Pt has ESRD due to DM.  He had a L upper arm AVF prior to starting HD  which was ligated due to L arm steal syndrome.  He started HD in Jan 2014 and did home HD.  The family couldn't cannulate the R arm AVF successfully so by mid 2014 they decided to switch to PD which was done late summer 2014.  In Jan 2015 he was admitted with FTT felt to be due to underdialysis on PD and PD was abandoned and he   . GERD (gastroesophageal reflux disease)   . Glomerulosclerosis, diabetic ([place])   . Heart murmur   . History of blood transfusion   . Hypertension    off of meds due to orthostatic hypotension  . Kidney transplant recipient   . Open wound of both legs with complication    Pt has had progressive wounds of both LE's including gangrene of the toes and patchy necrosis of the calves.  He has been treated at [facility] by Dr Jerline Pain with hyperbaric O2 5d per week.  He is getting Na Thiosulfate with HD for suspected calciphylaxis. Pt says doctor's aren't sure if these ulcers were diabetic ulcers or calciphylaxis.  He underwent bilat BKA on 07/30/13.  He says that the woun     Family History  Problem Relation Age of Onset  . Cancer Mother   . Cancer Father  prostate cancer  . Heart disease Maternal Grandfather   . Stroke Paternal Grandmother      Social History:   reports that he has never smoked. He has never used smokeless tobacco. He reports that he does not drink alcohol or use drugs.  Medications  Current Facility-Administered Medications:  .  sodium chloride flush (NS) 0.9 % injection 3 mL, 3 mL, Intravenous, Once, Plunkett, Whitney, MD  Current Outpatient Medications:  .  amLODipine (NORVASC) 10 MG tablet, Take 10 mg by mouth daily., Disp: , Rfl:  .  aspirin EC 81 MG tablet, Take 81 mg by mouth daily., Disp: , Rfl:  .  atorvastatin (LIPITOR) 10 MG tablet, Take 1 tablet (10 mg total) by mouth daily., Disp: 30 tablet, Rfl: 1 .  cholecalciferol (VITAMIN D) 1000 UNITS tablet, Take 1,000 Units by mouth daily., Disp: , Rfl:  .  Insulin Glargine (BASAGLAR  KWIKPEN) 100 UNIT/ML SOPN, Inject 0.2 mLs (20 Units total) into the skin daily., Disp: , Rfl:  .  ipratropium-albuterol (DUONEB) 0.5-2.5 (3) MG/3ML SOLN, Take 3 mLs by nebulization every 6 (six) hours as needed., Disp: 360 mL, Rfl: 2 .  magnesium chloride (SLOW-MAG) 64 MG TBEC SR tablet, Take 3 tablets by mouth 3 (three) times daily. , Disp: , Rfl:  .  mycophenolate (MYFORTIC) 180 MG EC tablet, Take 360 mg by mouth 2 (two) times daily. , Disp: , Rfl:  .  omeprazole (PRILOSEC) 40 MG capsule, Take 40 mg by mouth daily. , Disp: , Rfl:  .  predniSONE (DELTASONE) 5 MG tablet, Take 5 mg by mouth daily with breakfast. , Disp: , Rfl:  .  tacrolimus (PROGRAF) 1 MG capsule, Take 3-4 mg by mouth 2 (two) times daily. 4 mg in the AM and 3 mg in the PM, Disp: , Rfl:  .  tamsulosin (FLOMAX) 0.4 MG CAPS capsule, Take 0.4 mg by mouth daily after breakfast. , Disp: , Rfl:    Exam: Current vital signs: Wt 71.6 kg   BMI (P) 20.83 kg/m  Vital signs in last 24 hours: Weight:  [71.6 kg] 71.6 kg (06/10 1300)  Physical Exam  Constitutional: Frail middle-aged Caucasian male not in distress. psych: Affect appropriate to situation Eyes: No scleral injection HENT: No OP obstrucion Head: Normocephalic.  Cardiovascular: Normal rate and regular rhythm.  Respiratory: Effort normal, non-labored breathing GI: Soft.  No distension. There is no tenderness.  Skin: WDI Bilateral below-knee amputation Neurological Exam :: Awake alert globally aphasic.  [place].  Follows only occasional midline commands to pantomime.  Left gaze preference.  Unable to look to the right.  Blinks to threat on the left but not on the right.  Right lower facial weakness.  Tongue midline.  Motor system exam shows flaccid right upper extremity with only trace withdrawal to painful stimuli.  He has only trace right lower extremity weakness with slight drift.  Normal antigravity strength on the left side. NIHSS 19  Labs I have reviewed labs in epic  and the results pertinent to this consultation are:   CBC    Component Value Date/Time   WBC 10.6 (H) 05/24/2018 0544   RBC 3.21 (L) 05/24/2018 0544   HGB 9.3 (L) 10/14/2018 1250   HGB 11.3 (L) 07/07/2012 0910   HCT 27.9 (L) 05/24/2018 0544   HCT 34.7 (L) 07/07/2012 0910   PLT 182 05/24/2018 0544   PLT 95 (L) 07/07/2012 0910   MCV 86.9 05/24/2018 0544   MCV 96.9 07/07/2012 0910   MCH  28.3 05/24/2018 0544   MCHC 32.6 05/24/2018 0544   RDW 14.3 05/24/2018 0544   RDW 13.3 07/07/2012 0910   LYMPHSABS 1.0 10/28/2016 1900   LYMPHSABS 1.2 07/07/2012 0910   MONOABS 0.3 10/28/2016 1900   MONOABS 0.5 07/07/2012 0910   EOSABS 0.1 10/28/2016 1900   EOSABS 0.0 07/07/2012 0910   BASOSABS 0.0 10/28/2016 1900   BASOSABS 0.0 07/07/2012 0910    CMP     Component Value Date/Time   NA 138 05/25/2018 1152   K 3.8 05/25/2018 1152   CL 112 (H) 05/25/2018 1152   CO2 15 (L) 05/25/2018 1152   GLUCOSE 206 (H) 05/25/2018 1152   BUN 71 (H) 05/25/2018 1152   CREATININE 4.15 (H) 05/25/2018 1152   CALCIUM 7.3 (L) 05/25/2018 1152   PROT 5.9 (L) 05/24/2018 0331   ALBUMIN 3.0 (L) 05/24/2018 0331   AST 12 (L) 05/24/2018 0331   ALT 10 05/24/2018 0331   ALKPHOS 71 05/24/2018 0331   BILITOT 0.2 (L) 05/24/2018 0331   GFRNONAA 14 (L) 05/25/2018 1152   GFRAA 16 (L) 05/25/2018 1152    Lipid Panel     Component Value Date/Time   CHOL 76 04/03/2013 0225   TRIG 24 04/03/2013 0225   HDL 33 (L) 04/03/2013 0225   CHOLHDL 2.3 04/03/2013 0225   VLDL 5 04/03/2013 0225   LDLCALC 38 04/03/2013 0225     Imaging I have reviewed the images obtained:  CT-scan of the brain shows no acute abnormality with aspect score of 10. CT angiogram not ordered given patient's renal insufficiency and presenting within 6 hours of onset of stroke stage IV cerebral angiogram MRI examination of the brain ordered  Etta Quill PA-C [facility] [phone]  M-F  (9:00 am- 5:00 PM)  10/28/2018, 1:14 PM      Assessment: 63 year old Caucasian male presenting with sudden onset of aphasia right hemiplegia left gaze deviation likely due to left MCA acute infarct.  Etiology likely due to known history of atrial fibrillation and not being on anticoagulation.   He has presented with an 4-1/2 hours of rotation will benefit with thrombolysis with IV TPA.  I have discussed risk benefit of TPA including alternatives with patient's wife over the phone 4- 6% risk of intracerebral hemorrhage she is in agreement. ( Platelet count of 95,000 report was  not available decision to give TPA was made)  Patient likely has large vessel occlusion the left brain itself with emergent mechanical thrombectomy and I discussed the case with Dr. Estanislado Pandy dimensional radiologist agrees hand I spoke to the patient's wife as well as Dr. Estanislado Pandy and obtain 3 emergency consent from the wife to proceed with patient will be transferred to [facility].  His COVID STATUS is unknown at the present time but results are pending.     Recommendations:  IV TPA 0.9 mg/kg with strict control of blood pressure as per post TPA protocol.   Emergent intubation by anesthesia team radiology.  Transfer to neuro ICU after procedure close neurological monitoring and strict blood pressure control as per post TPA  consult critical care medicine team is severe hyperglycemia and medical issues. I have personally obtained history,examined this patient, reviewed notes, independently viewed imaging studies, participated in medical decision making and plan of care.ROS completed by me personally and pertinent positives fully documented  I have made any additions or clarifications directly to the above note    This patient is critically ill and at significant risk of neurological worsening, death and  care requires constant monitoring of vital signs, hemodynamics,respiratory and cardiac monitoring, extensive review of multiple databases, frequent  neurological assessment, discussion with family, other specialists and medical decision making of high complexity.I have made any additions or clarifications directly to the above note.This critical care time does not reflect procedure time, or teaching time or supervisory time of PA/NP/Med Resident etc but could involve care discussion time.  I spent 90 minutes of neurocritical care time  in the care of  this patient.   Antony Contras, MD

## 2018-10-28 NOTE — Progress Notes (Signed)
PHARMACIST CODE STROKE RESPONSE  Notified to mix tPA at 1333 by Dr. Leonie Man Delivered tPA to RN at 1337  tPA dose = 6.4 mg bolus over 1 minute followed by 58 mg for a total dose of 64.4 mg over 1 hour  Issues/delays encountered (if applicable): Clarification of last known normal.  Janae Bridgeman, PharmD PGY1 Pharmacy Resident Phone: [phone] 10/28/2018 1:44 PM

## 2018-10-28 NOTE — Progress Notes (Signed)
Patient had episode of emesis. This RN suctioned large amount out of patients mouth. 4 mg IV  Zofran given. Oxygen sats remain 98% on RA.

## 2018-10-28 NOTE — Transfer of Care (Signed)
Immediate Anesthesia Transfer of Care Note  Patient: Paul [name]  Procedure(s) Performed: IR WITH ANESTHESIA (N/A )  Patient Location: PACU  Anesthesia Type:General  Level of Consciousness: awake  Airway & Oxygen Therapy: Patient Spontanous Breathing  Post-op Assessment: Report given to RN and Post -op Vital signs reviewed and stable  Post vital signs: Reviewed and stable  Last Vitals:  Vitals Value Taken Time  BP 127/83 10/28/2018  4:16 PM  Temp    Pulse 78 10/28/2018  4:28 PM  Resp 14 10/28/2018  4:29 PM  SpO2 99 % 10/28/2018  4:28 PM  Vitals shown include unvalidated device data.  Last Pain:  Vitals:   10/28/18 1345  TempSrc: Oral  PainSc:          Complications: No apparent anesthesia complications

## 2018-10-28 NOTE — ED Notes (Signed)
Lab called with K of 4.2, CBG 790

## 2018-10-28 NOTE — Code Documentation (Addendum)
63 yo male with hx of insulin dependent DM, kidney failure, and bilateral BKA noted to be coming from home with complaints of left gaze and right sided weakness that started this morning. The patient was last seen well at 1100 by his colleagues on a video call for a meeting. During the meeting, pt started to have trouble talking and could not move his right arm. Wife found pt at 23 in the closet acting confused and unable to speak. She called 911 and EMS activated a Code Stroke. Stroke Team met patient upon arrival to the ED. Initial NIHSS 18 due to inability to answer questions, slight right facial droop, left gaze, right hemianopnia, flaccid right arm, right sided neglect, aphasia, and dysarthria. CBG > 600 upon arrival. Pt taken to CT and CT Head negative for hemorrhage. IR pre-notified at 1325 of potential IR candidate. Delay in giving tPA due to initial LKW at 2100 the night before when the wife spoke with him. She had not spoken with him this morning before the video call. Wife called colleagues and received story that patient was at his baseline at the beginning of the call at 1100 when he had a sudden change of speech and right sided weakness. Change in LKW made patient a tPA candidate. Pt not able to get CTA due to kidney status and patient not currently being on dialysis. At the time, staff could not get a hold of the wife to find out if patient had a kidney transplant - not clear per the chart. Wife called back at 51 and MD Leonie Man explained potential risks of tPA, pt getting CTA with contrast, and endovascular treatment. Wife consented to tPA along with proceeding to IR without obtaining a CTA. tPA was mixed and given to patient at 1339 - 6.4 mg bolus given over 1 minute with remaining 58 mg over one hour started. Pt taken back to ED for intubation. IR notified officially for patient treatment at 1346. Placed on cardiac monitor, undressed, and prepared for Anesthesia to intubated.   1359 - Anesthesia  arrived to intubated. Pt's potassium on I-Stat Chem 8 noted to be > 8.5. Waiting for final BMET to confirm patient's potassium for intubation.   1416 - Lab called x2 and reported that Potassium noted to be 4.1, Blood sugar 780. 1000 mg NS bolus started by ED RN.   [phone] - Anesthesia took full care of patient and started to give medications for intubation.   1434 - Patient arrived to the IR Suite.

## 2018-10-28 NOTE — Procedures (Signed)
S/P 4 vessel cerebral arteriogram RT CFA approach. Findings. 1.No angiographic evidence of occlusions,stenosis,dissections,aneurysm,avm or A V shunting. 2.Venous outflow WNLs.

## 2018-10-28 NOTE — Consult Note (Signed)
NAME:  Paul [name], MRN:  [id number], DOB:  1955-06-13, LOS: 0 ADMISSION DATE:  10/28/2018, CONSULTATION DATE:  10/28/18 REFERRING MD:  Dr Estanislado Pandy, CHIEF COMPLAINT:  med management  Brief History   63 yo cm presented with L MCA syndrome taken to IR for angio without intervention needed given IV TPA  History of present illness   63 yo with pmh IDDM, ESRD, BKA bilateral coming presented from home after last being seen normal on video call at 76 with colleague when he developed trouble talking and could not move his R arm. Wife found pt at 20 in the closet acting confused and unable to speak she called EMS. Arrived to ed altered with R facial droop, r side flaccid with neglect. ultimately given tpa at 1339.  Initial NIHSS 18 c/w L MCA symptoms taken to IR without stenosis or occlusion found to intervene.  We were called for med management post operatively  After speaking with wife pt states that over the past 2 weeks he has been much more lethargic and lack of po intake. She is unsure if he has been taking his insulin at home as he has been no eating well. They have been a bit concerned about his general malaise over the past 2 weeks. Increased sleeping. They were going to make appt to see a dr soon due to this. She states he has been vomiting intermittently for months, no recent n/v/f/c. She states that he wakes up around 4 or 5 and goes to bed around 1300. She does not wake up until 10-11 regularly so she is unaware of much of his recent activities.    Past Medical History   Past Medical History:  Diagnosis Date  . A-fib ([place])   . Anemia    esrd  . Calciphylaxis 05/2013   lower ext  . Cancer ([place])    hx duodenal adenoCa 2002, resected  . Closed left arm fracture 1967  . Corneal abrasion, left   . Depression   . Diabetes mellitus    Type 1 iddm x 11 yrs  . Diabetic neuropathy ([place])   . ESRD on hemodialysis ([place])    Pt has ESRD due to DM.  He had a L upper arm AVF prior to  starting HD which was ligated due to L arm steal syndrome.  He started HD in Jan 2014 and did home HD.  The family couldn't cannulate the R arm AVF successfully so by mid 2014 they decided to switch to PD which was done late summer 2014.  In Jan 2015 he was admitted with FTT felt to be due to underdialysis on PD and PD was abandoned and he   . GERD (gastroesophageal reflux disease)   . Glomerulosclerosis, diabetic ([place])   . Heart murmur   . History of blood transfusion   . Hypertension    off of meds due to orthostatic hypotension  . Kidney transplant recipient   . Open wound of both legs with complication    Pt has had progressive wounds of both LE's including gangrene of the toes and patchy necrosis of the calves.  He has been treated at [facility] by Dr Jerline Pain with hyperbaric O2 5d per week.  He is getting Na Thiosulfate with HD for suspected calciphylaxis. Pt says doctor's aren't sure if these ulcers were diabetic ulcers or calciphylaxis.  He underwent bilat BKA on 07/30/13.  He says that the [facility] Events   6/10: L  MCA syndrome  Consults:  6/10 IR 6/10 CCM  Procedures:  6/10:TPA given 6/10: ett Significant Diagnostic Tests:  6/10: cth: 1. No evidence of acute intracranial abnormality. 2. ASPECTS is 10. 3. Mild chronic small vessel ischemic disease and cerebral atrophy.  Micro Data:  6/10 covid: negative  Antimicrobials:    Interim history/subjective:  n/a  Objective   Blood pressure (!) 168/68, pulse 62, temperature 98.3 F (36.8 C), temperature source Oral, resp. rate (!) 22, weight 71.6 kg, SpO2 99 %.        Intake/Output Summary (Last 24 hours) at 10/28/2018 1623 Last data filed at 10/28/2018 1444 Gross per 24 hour  Intake 1000 ml  Output -  Net 1000 ml   Filed Weights   10/28/18 1300 10/28/18 1352  Weight: 71.6 kg 71.6 kg    Examination: General: no acute distress, awake but aphasic HEENT: NCAT, EOMI, PERRLA, MMMP Lungs: CTA, no  wheezes, rhonchi, rales Cardiovascular: RRR, no m/g/r Abdomen: soft, NT,ND, BS+ Extremities: + edema Skin: no rashes, warm and dry Neuro: sedated unresponsive GU: foley  Resolved Hospital Problem list   none  Assessment & Plan:  Suspected pseudo LMCA sydrome 2/2 hyperglycemia vs sz vs cva that perhaps tpa resolved:  -s/p iv tpa -s/p ir agio without need for intervention -cont neuro exams -limit sedation and use prn -insulin infusion -ns bolus x1L -no elevated anion gap and bicarb 20 would favor hhnk over dka in this setting.  -will check amylase lipase with h/o n/v  -pt moving all 4 extremities  S/p DDKT with chronic AbMR and IFTA, now with CKD IV:  P: -meeting with [facility] in July to restart process for 2nd transplant -not on dialysis and NOT anuric -may warrant renal consult in am -cont hydration -on chronic immunosuppression/steroids/prograf/cellcept  DM1 with hyperglycemia  -Suspect hhnk vs very poorly controlled dm at this time with recent poor po intake, perhaps no insulin administration, do not feel dka in light of bicarb and ag 12 P:  Iv hydration with 286m/hr initially and then 721mhr, may need to adjust ongoing insulin infusion Last glucose 790  N/V P:  -prn zofran -check amylase/lipase   Best practice:  Diet: npo  Pain/Anxiety/Delirium protocol (if indicated): per protocol VAP protocol (if indicated): per protocol DVT prophylaxis: per neuro GI prophylaxis:  Glucose control: insulin infusion Mobility: bed Code Status: full Family Communication: wife updated by phone Disposition: icu  Labs   CBC: Recent Labs  Lab 10/28/18 1327  WBC 9.4  NEUTROABS 7.1  HGB 9.9*  HCT 30.8*  MCV 87.5  PLT 123*    Basic Metabolic Panel: Recent Labs  Lab 10/28/18 1327  NA 128*  K 4.2  CL 96*  CO2 20*  GLUCOSE 790*  BUN 32*  CREATININE 3.31*  CALCIUM 9.7   GFR: Estimated Creatinine Clearance: 22.5 mL/min (A) (by C-G formula based on SCr of 3.31  mg/dL (H)). Recent Labs  Lab 10/28/18 1327  WBC 9.4    Liver Function Tests: Recent Labs  Lab 10/28/18 1327  AST 11*  ALT 12  ALKPHOS 57  BILITOT 0.9  PROT 5.3*  ALBUMIN 3.3*   No results for input(s): LIPASE, AMYLASE in the last 168 hours. No results for input(s): AMMONIA in the last 168 hours.  ABG    Component Value Date/Time   TCO2 31 06/14/2013 0627     Coagulation Profile: Recent Labs  Lab 10/28/18 1327  INR 1.2    Cardiac Enzymes: No results for input(s): CKTOTAL, CKMB,  CKMBINDEX, TROPONINI in the last 168 hours.  HbA1C: Hgb A1c MFr Bld  Date/Time Value Ref Range Status  05/24/2018 01:00 AM 7.9 (H) 4.8 - 5.6 % Final    Comment:    (NOTE) Pre diabetes:          5.7%-6.4% Diabetes:              >6.4% Glycemic control for   <7.0% adults with diabetes   05/25/2013 03:14 AM 6.7 (H) <5.7 % Final    Comment:    (NOTE)                                                                       According to the ADA Clinical Practice Recommendations for 2011, when HbA1c is used as a screening test:  >=6.5%   Diagnostic of Diabetes Mellitus           (if abnormal result is confirmed) 5.7-6.4%   Increased risk of developing Diabetes Mellitus References:Diagnosis and Classification of Diabetes Mellitus,Diabetes HYIF,0277,41(OINOM 1):S62-S69 and Standards of Medical Care in         Diabetes - 2011,Diabetes Care,2011,34 (Suppl 1):S11-S61.    CBG: Recent Labs  Lab 10/28/18 1311  GLUCAP >600*    Review of Systems:   Unavailable due to pt's intuabtion  Past Medical History  He,  has a past medical history of A-fib ([place]), Anemia, Calciphylaxis (05/2013), Cancer ([place]), Closed left arm fracture (1967), Corneal abrasion, left, Depression, Diabetes mellitus, Diabetic neuropathy ([place]), ESRD on hemodialysis ([place]), GERD (gastroesophageal reflux disease), Glomerulosclerosis, diabetic ([place]), Heart murmur, History of blood transfusion, Hypertension, Kidney transplant  recipient, and Open wound of both legs with complication.   Surgical History    Past Surgical History:  Procedure Laterality Date  . AMPUTATION Bilateral 07/30/2013   Procedure: AMPUTATION BELOW KNEE;  Surgeon: Newt Minion, MD;  Location: [place];  Service: Orthopedics;  Laterality: Bilateral;  Bilateral Below Knee Amputations  . AMPUTATION Bilateral 09/03/2013   Procedure: AMPUTATION BELOW KNEE;  Surgeon: Newt Minion, MD;  Location: [place] [facility];  Service: Orthopedics;  Laterality: Bilateral;  Bilateral Below Knee Amputation Revision  . AV FISTULA PLACEMENT  06/07/2011   Procedure: ARTERIOVENOUS (AV) FISTULA CREATION;  Surgeon: Angelia Mould, MD;  Location: [place];  Service: Vascular;  Laterality: Left;  . AV FISTULA PLACEMENT  06/18/2012   Procedure: ARTERIOVENOUS (AV) FISTULA CREATION;  Surgeon: Mal Misty, MD;  Location: [place];  Service: Vascular;  Laterality: Right;  [place]  . BILIARY DIVERSION, EXTERNAL  2002  . BONE MARROW BIOPSY  2012  . CAPD INSERTION N/A 01/14/2013   Procedure: LAPAROSCOPIC INSERTION CONTINUOUS AMBULATORY PERITONEAL DIALYSIS  (CAPD) CATHETER;  Surgeon: Adin Hector, MD;  Location: [facility];  Service: General;  Laterality: N/A;  . CAPD REMOVAL N/A 07/09/2013   Procedure: CONTINUOUS AMBULATORY PERITONEAL DIALYSIS  (CAPD) CATHETER REMOVAL;  Surgeon: Adin Hector, MD;  Location: [facility];  Service: General;  Laterality: N/A;  . COLONOSCOPY     Hx: of  . INSERTION OF DIALYSIS CATHETER  05/22/2012   Procedure: INSERTION OF DIALYSIS CATHETER;  Surgeon: Mal Misty, MD;  Location: [facility];  Service: Vascular;  Laterality: N/A;  right internal jugular vein  . LIGATION OF  ARTERIOVENOUS  FISTULA  05/22/2012   Procedure: LIGATION OF ARTERIOVENOUS  FISTULA;  Surgeon: Mal Misty, MD;  Location: [facility];  Service: Vascular;  Laterality: Left;  Left brachio-cephalic fistula  . LOWER EXTREMITY ANGIOGRAM N/A 06/14/2013   Procedure: LOWER EXTREMITY ANGIOGRAM;  Surgeon: Angelia Mould, MD;  Location: [facility] CATH LAB;  Service: Cardiovascular;  Laterality: N/A;  . NEPHRECTOMY TRANSPLANTED ORGAN    . OTHER SURGICAL HISTORY     Cyst removed from back  . PORTACATH PLACEMENT  2002  . REMOVAL OF A DIALYSIS CATHETER    . RENAL BIOPSY, PERCUTANEOUS  2012  . TONSILLECTOMY    . VASCULAR SURGERY    . WHIPPLE PROCEDURE  2002   duodenal ca, Dr. Harlow Asa     Social History   reports that he has never smoked. He has never used smokeless tobacco. He reports that he does not drink alcohol or use drugs.   Family History   His family history includes Cancer in his father and mother; Heart disease in his maternal grandfather; Stroke in his paternal grandmother.   Allergies Allergies  Allergen Reactions  . Metformin And Related Other (See Comments)    Kidney problems     Home Medications  Prior to Admission medications   Medication Sig Start Date End Date Taking? Authorizing Provider  amLODipine (NORVASC) 10 MG tablet Take 10 mg by mouth daily.    [provider]  aspirin EC 81 MG tablet Take 81 mg by mouth daily.    [provider]  atorvastatin (LIPITOR) 10 MG tablet Take 1 tablet (10 mg total) by mouth daily. 08/13/13   Angiulli, Lavon Paganini, PA-C  cholecalciferol (VITAMIN D) 1000 UNITS tablet Take 1,000 Units by mouth daily.    [provider]  Insulin Glargine (BASAGLAR KWIKPEN) 100 UNIT/ML SOPN Inject 0.2 mLs (20 Units total) into the skin daily. 05/25/18   Hosie Poisson, MD  ipratropium-albuterol (DUONEB) 0.5-2.5 (3) MG/3ML SOLN Take 3 mLs by nebulization every 6 (six) hours as needed. 05/25/18   Hosie Poisson, MD  magnesium chloride (SLOW-MAG) 64 MG TBEC SR tablet Take 3 tablets by mouth 3 (three) times daily.  08/26/14   [provider]  mycophenolate (MYFORTIC) 180 MG EC tablet Take 360 mg by mouth 2 (two) times daily.     [provider]  omeprazole (PRILOSEC) 40 MG capsule Take 40 mg by mouth daily.  01/11/14   [provider]  predniSONE (DELTASONE) 5 MG tablet Take 5 mg by mouth daily with breakfast.     [provider]  tacrolimus (PROGRAF) 1 MG capsule Take 3-4 mg by mouth 2 (two) times daily. 4 mg in the AM and 3 mg in the PM    [provider]  tamsulosin (FLOMAX) 0.4 MG CAPS capsule Take 0.4 mg by mouth daily after breakfast.     [provider]     Critical care time: The patient is critically ill with multiple organ systems failure and requires high complexity decision making for assessment and support, frequent evaluation and titration of therapies, application of advanced monitoring technologies and extensive interpretation of multiple databases.  Critical care time 53 mins. This represents my time independent of the NPs time taking care of the pt. This is excluding procedures.     Audria Nine DO Pager: [phone] After hours pager: [phone]  [facility] 10/28/2018, 4:23 PM

## 2018-10-28 NOTE — Anesthesia Procedure Notes (Signed)
Procedure Name: Intubation Date/Time: 10/28/2018 2:15 PM Performed by: Verdie Drown, CRNA Pre-anesthesia Checklist: Patient identified, Emergency Drugs available, Suction available, Patient being monitored and Timeout performed Patient Re-evaluated:Patient Re-evaluated prior to induction Oxygen Delivery Method: Circle system utilized Preoxygenation: Pre-oxygenation with 100% oxygen Induction Type: IV induction and Rapid sequence Grade View: Grade I Tube type: Oral Tube size: 7.5 mm Number of attempts: 1 Airway Equipment and Method: Stylet and Video-laryngoscopy Secured at: 22 cm Tube secured with: Tape Dental Injury: Teeth and Oropharynx as per pre-operative assessment

## 2018-10-28 NOTE — Progress Notes (Signed)
[facility] Progress Note Patient Name: Paul [name] DOB: 1955/08/14 MRN: [id number]   Date of Service  10/28/2018  HPI/Events of Note  Hypokalemia - K+ = 3.1. Patient with history of ESRD.   eICU Interventions  Will not replace K+ at this time d/t ESRD.     Intervention Category Major Interventions: Electrolyte abnormality - evaluation and management  Buzz Axel Eugene 10/28/2018, 11:06 PM

## 2018-10-28 NOTE — ED Notes (Signed)
Notified Dr. Maryan Rued of pt's Chem8 results

## 2018-10-28 NOTE — Anesthesia Postprocedure Evaluation (Signed)
Anesthesia Post Note  Patient: Paul [name]  Procedure(s) Performed: IR WITH ANESTHESIA (N/A )     Patient location during evaluation: PACU Anesthesia Type: General Level of consciousness: awake and alert Pain management: pain level controlled Vital Signs Assessment: post-procedure vital signs reviewed and stable Respiratory status: spontaneous breathing, nonlabored ventilation, respiratory function stable and patient connected to nasal cannula oxygen Cardiovascular status: blood pressure returned to baseline and stable Postop Assessment: no apparent nausea or vomiting Anesthetic complications: no    Last Vitals:  Vitals:   10/28/18 1830 10/28/18 1845  BP: 115/67 124/60  Pulse: 95 93  Resp: 13 13  Temp:    SpO2: 98% 98%    Last Pain:  Vitals:   10/28/18 1730  TempSrc: Oral  PainSc:                  Freddi Schrager [place]

## 2018-10-28 NOTE — ED Provider Notes (Signed)
[facility] EMERGENCY DEPARTMENT Provider Note   CSN: [id number] Arrival date & time: 10/28/18  1307    History   Chief Complaint Chief Complaint  Patient presents with  . Code Stroke    HPI Paul [name] is a 63 y.o. male.     Pt is a 63y/o male with mmp including renal transplant, DM, HTN, a.fib presenting today as a code stroke.  Pt was last seen normal at 11am when he started a work meeting but then had to stop half way through because he could no longer conduct the meeting.  His wife found him sitting in the closet and no making any sense.  Pt was clumsy with the right arm and aphasic.  Blood sugar on EMS arrival ready >600.  Wife reports normal before 60 which is walking and carrying on a normal conversation.  No recent fever, cough, N/V.  The history is provided by the EMS personnel and the spouse.    Past Medical History:  Diagnosis Date  . A-fib ([place])   . Anemia    esrd  . Calciphylaxis 05/2013   lower ext  . Cancer ([place])    hx duodenal adenoCa 2002, resected  . Closed left arm fracture 1967  . Corneal abrasion, left   . Depression   . Diabetes mellitus    Type 1 iddm x 11 yrs  . Diabetic neuropathy ([place])   . ESRD on hemodialysis ([place])    Pt has ESRD due to DM.  He had a L upper arm AVF prior to starting HD which was ligated due to L arm steal syndrome.  He started HD in Jan 2014 and did home HD.  The family couldn't cannulate the R arm AVF successfully so by mid 2014 they decided to switch to PD which was done late summer 2014.  In Jan 2015 he was admitted with FTT felt to be due to underdialysis on PD and PD was abandoned and he   . GERD (gastroesophageal reflux disease)   . Glomerulosclerosis, diabetic ([place] )   . Heart murmur   . History of blood transfusion   . Hypertension    off of meds due to orthostatic hypotension  . Kidney transplant recipient   . Open wound of both legs with complication    Pt has had progressive wounds of both  LE's including gangrene of the toes and patchy necrosis of the calves.  He has been treated at [facility] by Dr Jerline Pain with hyperbaric O2 5d per week.  He is getting Na Thiosulfate with HD for suspected calciphylaxis. Pt says doctor's aren't sure if these ulcers were diabetic ulcers or calciphylaxis.  He underwent bilat BKA on 07/30/13.  He says that the woun    Patient Active Problem List   Diagnosis Date Noted  . Stroke ([place]) 10/28/2018  . Metabolic acidosis 70/62/3762  . Dehydration 05/24/2018  . Pressure injury of skin 05/24/2018  . Dyspnea 05/23/2018  . Acute respiratory failure with hypoxia ([place]) 05/23/2018  . Abnormal cardiovascular stress test 12/29/2013  . Awaiting organ transplant 12/09/2013  . Leg ulcer ([place]) 11/17/2013  . Chronic kidney disease (CKD), stage V ([place]) 11/17/2013  . Diabetes ([place]) 11/17/2013  . Cancer of duodenum ([place]) 11/17/2013  . Dyslipidemia 11/17/2013  . Chronic kidney disease requiring chronic dialysis ([place]) 11/17/2013  . Hypercholesteremia 11/17/2013  . H/O malignant neoplasm 11/17/2013  . H/O: HTN (hypertension) 11/17/2013  . BP (high blood pressure) 11/17/2013  . Hypotension 11/17/2013  .  Angiopathy, peripheral ([place]) 11/17/2013  . AF (paroxysmal atrial fibrillation) ([place]) 11/17/2013  . BKA stump complication ([place]) 66/10/3014  . S/P bilateral BKA (below knee amputation) ([place]) 08/03/2013  . S/P BKA (below knee amputation) bilateral ([place]) 07/30/2013  . Open wound of both legs with complication 05/28/3233  . Acute blood loss anemia 07/12/2013  . Syncope 07/09/2013  . Intolerance to CAPD peritoneal dialysis s/p CAPD cath removal 07/09/2013 07/05/2013  . Critical lower limb ischemia 06/30/2013  . Extremity pain 06/30/2013  . Atherosclerosis of native arteries of the extremities with ulceration(440.23) 06/17/2013  . Gait disorder 05/31/2013  . Weakness 05/24/2013  . Depression 04/03/2013  . GERD (gastroesophageal reflux disease) 04/03/2013  . Atrial  fibrillation ([place]) 04/02/2013  . DM (diabetes mellitus) ([place]) 12/28/2012  . Other complications due to renal dialysis device, implant, and graft 05/19/2012  . Diabetes mellitus, type 2 ([place]) 11/13/2011  . Anemia 07/02/2011  . ESRD on hemodialysis ([place]) 05/29/2011    Past Surgical History:  Procedure Laterality Date  . AMPUTATION Bilateral 07/30/2013   Procedure: AMPUTATION BELOW KNEE;  Surgeon: Newt Minion, MD;  Location: [facility];  Service: Orthopedics;  Laterality: Bilateral;  Bilateral Below Knee Amputations  . AMPUTATION Bilateral 09/03/2013   Procedure: AMPUTATION BELOW KNEE;  Surgeon: Newt Minion, MD;  Location: [facility];  Service: Orthopedics;  Laterality: Bilateral;  Bilateral Below Knee Amputation Revision  . AV FISTULA PLACEMENT  06/07/2011   Procedure: ARTERIOVENOUS (AV) FISTULA CREATION;  Surgeon: Angelia Mould, MD;  Location: [facility];  Service: Vascular;  Laterality: Left;  . AV FISTULA PLACEMENT  06/18/2012   Procedure: ARTERIOVENOUS (AV) FISTULA CREATION;  Surgeon: Mal Misty, MD;  Location: [facility];  Service: Vascular;  Laterality: Right;  [place]  . BILIARY DIVERSION, EXTERNAL  2002  . BONE MARROW BIOPSY  2012  . CAPD INSERTION N/A 01/14/2013   Procedure: LAPAROSCOPIC INSERTION CONTINUOUS AMBULATORY PERITONEAL DIALYSIS  (CAPD) CATHETER;  Surgeon: Adin Hector, MD;  Location: [facility];  Service: General;  Laterality: N/A;  . CAPD REMOVAL N/A 07/09/2013   Procedure: CONTINUOUS AMBULATORY PERITONEAL DIALYSIS  (CAPD) CATHETER REMOVAL;  Surgeon: Adin Hector, MD;  Location: [facility];  Service: General;  Laterality: N/A;  . COLONOSCOPY     Hx: of  . INSERTION OF DIALYSIS CATHETER  05/22/2012   Procedure: INSERTION OF DIALYSIS CATHETER;  Surgeon: Mal Misty, MD;  Location: [facility];  Service: Vascular;  Laterality: N/A;  right internal jugular vein  . LIGATION OF ARTERIOVENOUS  FISTULA  05/22/2012   Procedure: LIGATION OF ARTERIOVENOUS  FISTULA;  Surgeon: Mal Misty, MD;   Location: [facility];  Service: Vascular;  Laterality: Left;  Left brachio-cephalic fistula  . LOWER EXTREMITY ANGIOGRAM N/A 06/14/2013   Procedure: LOWER EXTREMITY ANGIOGRAM;  Surgeon: Angelia Mould, MD;  Location: [facility] CATH LAB;  Service: Cardiovascular;  Laterality: N/A;  . NEPHRECTOMY TRANSPLANTED ORGAN    . OTHER SURGICAL HISTORY     Cyst removed from back  . PORTACATH PLACEMENT  2002  . REMOVAL OF A DIALYSIS CATHETER    . RENAL BIOPSY, PERCUTANEOUS  2012  . TONSILLECTOMY    . VASCULAR SURGERY    . WHIPPLE PROCEDURE  2002   duodenal ca, Dr. Harlow Asa        Home Medications    Prior to Admission medications   Medication Sig Start Date End Date Taking? Authorizing Provider  amLODipine (NORVASC) 10 MG tablet Take 10 mg by mouth daily.  [provider]  aspirin EC 81 MG tablet Take 81 mg by mouth daily.    [provider]  atorvastatin (LIPITOR) 10 MG tablet Take 1 tablet (10 mg total) by mouth daily. 08/13/13   Angiulli, Lavon Paganini, PA-C  cholecalciferol (VITAMIN D) 1000 UNITS tablet Take 1,000 Units by mouth daily.    [provider]  Insulin Glargine (BASAGLAR KWIKPEN) 100 UNIT/ML SOPN Inject 0.2 mLs (20 Units total) into the skin daily. 05/25/18   Hosie Poisson, MD  ipratropium-albuterol (DUONEB) 0.5-2.5 (3) MG/3ML SOLN Take 3 mLs by nebulization every 6 (six) hours as needed. 05/25/18   Hosie Poisson, MD  magnesium chloride (SLOW-MAG) 64 MG TBEC SR tablet Take 3 tablets by mouth 3 (three) times daily.  08/26/14   [provider]  mycophenolate (MYFORTIC) 180 MG EC tablet Take 360 mg by mouth 2 (two) times daily.     [provider]  omeprazole (PRILOSEC) 40 MG capsule Take 40 mg by mouth daily.  01/11/14   [provider]  predniSONE (DELTASONE) 5 MG tablet Take 5 mg by mouth daily with breakfast.     [provider]  tacrolimus (PROGRAF) 1 MG capsule Take 3-4 mg by mouth 2 (two) times daily. 4 mg in the AM and 3 mg in the PM     [provider]  tamsulosin (FLOMAX) 0.4 MG CAPS capsule Take 0.4 mg by mouth daily after breakfast.     [provider]    Family History Family History  Problem Relation Age of Onset  . Cancer Mother   . Cancer Father        prostate cancer  . Heart disease Maternal Grandfather   . Stroke Paternal Grandmother     Social History Social History   Tobacco Use  . Smoking status: Never Smoker  . Smokeless tobacco: Never Used  Substance Use Topics  . Alcohol use: No  . Drug use: No     Allergies   Metformin and related   Review of Systems Review of Systems  Unable to perform ROS: Mental status change     Physical Exam Updated Vital Signs BP (!) 174/67   Pulse 61   Temp 98.3 F (36.8 C) (Oral)   Resp 13   Wt 71.6 kg   SpO2 99%   BMI (P) 20.83 kg/m   Physical Exam Vitals signs and nursing note reviewed.  Constitutional:      General: He is not in acute distress.    Appearance: He is well-developed.     Comments: Eyes are open but not speaking  HENT:     Head: Normocephalic and atraumatic.  Eyes:     Conjunctiva/sclera: Conjunctivae normal.     Pupils: Pupils are equal, round, and reactive to light.  Neck:     Musculoskeletal: Normal range of motion and neck supple.  Cardiovascular:     Rate and Rhythm: Normal rate and regular rhythm.     Heart sounds: No murmur.  Pulmonary:     Effort: Pulmonary effort is normal. No respiratory distress.     Breath sounds: Normal breath sounds. No wheezing or rales.  Abdominal:     General: There is no distension.     Palpations: Abdomen is soft.     Tenderness: There is no abdominal tenderness. There is no guarding or rebound.  Musculoskeletal: Normal range of motion.        General: No tenderness.     Comments: Bilateral BKA.  Fistula  in the RUE  Skin:    General: Skin is warm and dry.     Findings: No erythema or rash.  Neurological:     Mental Status: He is alert.     Comments: Right  sided hemineglect with right sided upper and lower ext weakness.  Minimal right hand movement.  Not speaking but will follow some commands.      ED Treatments / Results  Labs (all labs ordered are listed, but only abnormal results are displayed) Labs Reviewed  CBC - Abnormal; Notable for the following components:      Result Value   RBC 3.52 (*)    Hemoglobin 9.9 (*)    HCT 30.8 (*)    Platelets 123 (*)    All other components within normal limits  COMPREHENSIVE METABOLIC PANEL - Abnormal; Notable for the following components:   Sodium 128 (*)    Chloride 96 (*)    CO2 20 (*)    Glucose, Bld 790 (*)    BUN 32 (*)    Creatinine, Ser 3.31 (*)    Total Protein 5.3 (*)    Albumin 3.3 (*)    AST 11 (*)    GFR calc non Af Amer 18 (*)    GFR calc Af Amer 21 (*)    All other components within normal limits  CBG MONITORING, ED - Abnormal; Notable for the following components:   Glucose-Capillary >600 (*)    All other components within normal limits  SARS CORONAVIRUS 2  PROTIME-INR  APTT  DIFFERENTIAL  I-STAT CHEM 8, ED  CBG MONITORING, ED    EKG EKG Interpretation  Date/Time:  Wednesday October 28 2018 13:48:42 EDT Ventricular Rate:  65 PR Interval:    QRS Duration: 101 QT Interval:  436 QTC Calculation: 454 R Axis:   -14 Text Interpretation:  Sinus rhythm Normal ECG Confirmed by Blanchie Dessert [phone]) on 10/28/2018 2:04:53 PM   Radiology Ct Head Code Stroke Wo Contrast  Result Date: 10/28/2018 CLINICAL DATA:  Code stroke.  Aphasia.  Right-sided weakness. EXAM: CT HEAD WITHOUT CONTRAST TECHNIQUE: Contiguous axial images were obtained from the base of the skull through the vertex without intravenous contrast. COMPARISON:  05/24/2013 FINDINGS: Brain: There is no evidence of acute infarct, intracranial hemorrhage, mass, midline shift, or extra-axial fluid collection. There is mild cerebral atrophy. Cerebral white matter hypodensities are nonspecific but compatible with mild  chronic small vessel ischemic disease. Vascular: Calcified atherosclerosis at the skull base. No hyperdense vessel. Skull: No fracture or suspicious osseous lesion. Unchanged subcentimeter sclerotic focus in the right mandibular condyle, possibly a bone island. Sinuses/Orbits: Visualized paranasal sinuses and mastoid air cells are clear. Bilateral cataract extraction is noted. Other: None. ASPECTS [facility] Stroke Program Early CT Score) - Ganglionic level infarction (caudate, lentiform nuclei, internal capsule, insula, M1-M3 cortex): 7 - Supraganglionic infarction (M4-M6 cortex): 3 Total score (0-10 with 10 being normal): 10 IMPRESSION: 1. No evidence of acute intracranial abnormality. 2. ASPECTS is 10. 3. Mild chronic small vessel ischemic disease and cerebral atrophy. These results were communicated to Dr. Leonie Man at 1:25 pm on 10/28/2018 by text page via the [facility] messaging system. Electronically Signed   By: Logan Bores M.D.   On: 10/28/2018 13:25    Procedures Procedures (including critical care time)  Medications Ordered in ED Medications  sodium chloride flush (NS) 0.9 % injection 3 mL (has no administration in time range)  alteplase (ACTIVASE) 1 mg/mL infusion 64.4 mg (64.4 mg Intravenous New Bag/Given 10/28/18  1339)    Followed by  0.9 %  sodium chloride infusion (50 mLs Intravenous New Bag/Given 10/28/18 1422)  tirofiban (AGGRASTAT) 5-0.9 MG/100ML-% injection (has no administration in time range)  aspirin 81 MG chewable tablet (has no administration in time range)  ticagrelor (BRILINTA) 90 MG tablet (has no administration in time range)  clopidogrel (PLAVIX) 300 MG tablet (has no administration in time range)  lidocaine (XYLOCAINE) 1 % (with pres) injection (has no administration in time range)  eptifibatide (INTEGRILIN) 20 MG/10ML injection (has no administration in time range)  nitroGLYCERIN 100 mcg/mL intra-arterial injection (has no administration in time range)   stroke: mapping our  early stages of recovery book (has no administration in time range)  0.9 %  sodium chloride infusion (has no administration in time range)  acetaminophen (TYLENOL) tablet 650 mg (has no administration in time range)    Or  acetaminophen (TYLENOL) solution 650 mg (has no administration in time range)    Or  acetaminophen (TYLENOL) suppository 650 mg (has no administration in time range)  senna-docusate (Senokot-S) tablet 1 tablet (has no administration in time range)  pantoprazole (PROTONIX) injection 40 mg (has no administration in time range)  amLODipine (NORVASC) tablet 10 mg (has no administration in time range)  atorvastatin (LIPITOR) tablet 10 mg (has no administration in time range)  Basaglar KwikPen KwikPen 20 Units (has no administration in time range)  tacrolimus (PROGRAF) capsule 3-4 mg (has no administration in time range)  sodium chloride 0.9 % bolus 1,000 mL (1,000 mLs Intravenous New Bag/Given 10/28/18 1354)  fentaNYL (SUBLIMAZE) 100 MCG/2ML injection (100 mcg  Given 10/28/18 1426)     Initial Impression / Assessment and Plan / ED Course  I have reviewed the triage vital signs and the nursing notes.  Pertinent labs & imaging results that were available during my care of the patient were reviewed by me and considered in my medical decision making (see chart for details).        Pt presented as a code stroke last seen normal 2.5 hours ago.  Pt is LVO positive with hemineglect, right upper/lower weakness.  Pt his hyperglycemic here to 750 but normal potassium and unchanged EKG.  Pt is a  Renal transplant pt and Cr is baseline at 3.3.  Pt is hypertensive but CT is neg and tPA is started.  Pt to go to IR for procedure.  Pt intubated by anesthesia.  HD stable here.  CBC, PT/INR wnl.  After fluid bolus pt started on insulin gtt.  CRITICAL CARE Performed by: Tareq Dwan Total critical care time: 30 minutes Critical care time was exclusive of separately billable procedures  and treating other patients. Critical care was necessary to treat or prevent imminent or life-threatening deterioration. Critical care was time spent personally by me on the following activities: development of treatment plan with patient and/or surrogate as well as nursing, discussions with consultants, evaluation of patient's response to treatment, examination of patient, obtaining history from patient or surrogate, ordering and performing treatments and interventions, ordering and review of laboratory studies, ordering and review of radiographic studies, pulse oximetry and re-evaluation of patient's condition.   Final Clinical Impressions(s) / ED Diagnoses   Final diagnoses:  Acute ischemic stroke [facility])  Hyperglycemia    ED Discharge Orders    None       Blanchie Dessert, MD 10/28/18 1454

## 2018-10-28 NOTE — Progress Notes (Signed)
Elink notified patient magnesium 1.1 Patient HR still 130s-140s despite amio bolus and gtt. Elink notified of this information. Waiting for response.

## 2018-10-28 NOTE — Progress Notes (Signed)
[facility] Progress Note Patient Name: Paul [name] DOB: 03-21-1956 MRN: [id number]   Date of Service  10/28/2018  HPI/Events of Note  AFIB with RVR - Ventricular rate = 130's to 140's. Mg++ = 1.1.  eICU Interventions  Will order: 1. Replace Mg++ 2. Amiodarone 150 mg IV over 10 minutes now.      Intervention Category Major Interventions: Electrolyte abnormality - evaluation and management  Sommer,Steven Eugene 10/28/2018, 11:51 PM

## 2018-10-28 NOTE — Progress Notes (Signed)
[facility] Progress Note Patient Name: Paul [name] DOB: 1956-02-19 MRN: [id number]   Date of Service  10/28/2018  HPI/Events of Note  SVT - Ventricular rate = 150's. BP 120/58. Sat = 98% and RR = 24.   eICU Interventions  Will order: 1. Amiodarone IV load and infusion.  2. BMP and Mg++ level STAT.     Intervention Category Major Interventions: Arrhythmia - evaluation and management  Nhat Hearne Cornelia Copa 10/28/2018, 9:43 PM

## 2018-10-28 NOTE — Anesthesia Preprocedure Evaluation (Signed)
Anesthesia Evaluation  Patient identified by MRN, date of birth, ID band Patient confused    Reviewed: Unable to perform ROS - Chart review only  Airway Mallampati: I  TM Distance: >3 FB Neck ROM: Full    Dental   Pulmonary    Pulmonary exam normal        Cardiovascular hypertension, Pt. on medications Normal cardiovascular exam     Neuro/Psych Depression CVA    GI/Hepatic GERD  Medicated and Controlled,  Endo/Other  diabetes, Type 1, Insulin Dependent  Renal/GU Dialysis and ESRFRenal disease     Musculoskeletal   Abdominal   Peds  Hematology   Anesthesia Other Findings   Reproductive/Obstetrics                             Anesthesia Physical Anesthesia Plan  ASA: IV and emergent  Anesthesia Plan: General   Post-op Pain Management:    Induction: Intravenous  PONV Risk Score and Plan: 2 and Treatment may vary due to age or medical condition  Airway Management Planned: Oral ETT  Additional Equipment: Arterial line  Intra-op Plan:   Post-operative Plan: Extubation in OR  Informed Consent: I have reviewed the patients History and Physical, chart, labs and discussed the procedure including the risks, benefits and alternatives for the proposed anesthesia with the patient or authorized representative who has indicated his/her understanding and acceptance.       Plan Discussed with: CRNA and Surgeon  Anesthesia Plan Comments:         Anesthesia Quick Evaluation

## 2018-10-28 NOTE — Progress Notes (Signed)
CCM notified patients potassium 3.1. Waiting for response

## 2018-10-28 NOTE — Progress Notes (Signed)
1610 report to PACU RN

## 2018-10-29 ENCOUNTER — Inpatient Hospital Stay (HOSPITAL_COMMUNITY): Payer: Managed Care, Other (non HMO)

## 2018-10-29 ENCOUNTER — Encounter (HOSPITAL_COMMUNITY): Payer: Self-pay | Admitting: Radiology

## 2018-10-29 LAB — GLUCOSE, CAPILLARY
Glucose-Capillary: 105 mg/dL — ABNORMAL HIGH (ref 70–99)
Glucose-Capillary: 113 mg/dL — ABNORMAL HIGH (ref 70–99)
Glucose-Capillary: 121 mg/dL — ABNORMAL HIGH (ref 70–99)
Glucose-Capillary: 122 mg/dL — ABNORMAL HIGH (ref 70–99)
Glucose-Capillary: 122 mg/dL — ABNORMAL HIGH (ref 70–99)
Glucose-Capillary: 158 mg/dL — ABNORMAL HIGH (ref 70–99)
Glucose-Capillary: 163 mg/dL — ABNORMAL HIGH (ref 70–99)
Glucose-Capillary: 174 mg/dL — ABNORMAL HIGH (ref 70–99)
Glucose-Capillary: 177 mg/dL — ABNORMAL HIGH (ref 70–99)
Glucose-Capillary: 189 mg/dL — ABNORMAL HIGH (ref 70–99)
Glucose-Capillary: 192 mg/dL — ABNORMAL HIGH (ref 70–99)
Glucose-Capillary: 198 mg/dL — ABNORMAL HIGH (ref 70–99)
Glucose-Capillary: 201 mg/dL — ABNORMAL HIGH (ref 70–99)
Glucose-Capillary: 213 mg/dL — ABNORMAL HIGH (ref 70–99)
Glucose-Capillary: 543 mg/dL (ref 70–99)
Glucose-Capillary: 99 mg/dL (ref 70–99)
Glucose-Capillary: >600 mg/dL (ref 70–99)

## 2018-10-29 LAB — CBC WITH DIFFERENTIAL/PLATELET
Abs Immature Granulocytes: 0.08 10*3/uL — ABNORMAL HIGH (ref 0.00–0.07)
Basophils Absolute: 0 10*3/uL (ref 0.0–0.1)
Basophils Relative: 0 %
Eosinophils Absolute: 0 10*3/uL (ref 0.0–0.5)
Eosinophils Relative: 0 %
HCT: 29.8 % — ABNORMAL LOW (ref 39.0–52.0)
Hemoglobin: 9.5 g/dL — ABNORMAL LOW (ref 13.0–17.0)
Immature Granulocytes: 1 %
Lymphocytes Relative: 8 %
Lymphs Abs: 1.3 10*3/uL (ref 0.7–4.0)
MCH: 27.8 pg (ref 26.0–34.0)
MCHC: 31.9 g/dL (ref 30.0–36.0)
MCV: 87.1 fL (ref 80.0–100.0)
Monocytes Absolute: 0.9 10*3/uL (ref 0.1–1.0)
Monocytes Relative: 6 %
Neutro Abs: 13.8 10*3/uL — ABNORMAL HIGH (ref 1.7–7.7)
Neutrophils Relative %: 85 %
Platelets: 140 10*3/uL — ABNORMAL LOW (ref 150–400)
RBC: 3.42 MIL/uL — ABNORMAL LOW (ref 4.22–5.81)
RDW: 14 % (ref 11.5–15.5)
WBC: 16.1 10*3/uL — ABNORMAL HIGH (ref 4.0–10.5)
nRBC: 0 % (ref 0.0–0.2)

## 2018-10-29 LAB — BASIC METABOLIC PANEL
Anion gap: 10 (ref 5–15)
BUN: 30 mg/dL — ABNORMAL HIGH (ref 8–23)
CO2: 20 mmol/L — ABNORMAL LOW (ref 22–32)
Calcium: 9 mg/dL (ref 8.9–10.3)
Chloride: 103 mmol/L (ref 98–111)
Creatinine, Ser: 3.3 mg/dL — ABNORMAL HIGH (ref 0.61–1.24)
GFR calc Af Amer: 22 mL/min — ABNORMAL LOW (ref >60)
GFR calc non Af Amer: 19 mL/min — ABNORMAL LOW (ref >60)
Glucose, Bld: 198 mg/dL — ABNORMAL HIGH (ref 70–99)
Potassium: 3.6 mmol/L (ref 3.5–5.1)
Sodium: 133 mmol/L — ABNORMAL LOW (ref 135–145)

## 2018-10-29 LAB — LIPID PANEL
Cholesterol: 108 mg/dL (ref 0–200)
HDL: 49 mg/dL (ref >40)
LDL Cholesterol: 48 mg/dL (ref 0–99)
Total CHOL/HDL Ratio: 2.2 RATIO
Triglycerides: 55 mg/dL (ref <150)
VLDL: 11 mg/dL (ref 0–40)

## 2018-10-29 LAB — HEMOGLOBIN A1C
Hgb A1c MFr Bld: 13.6 % — ABNORMAL HIGH (ref 4.8–5.6)
Mean Plasma Glucose: 343.62 mg/dL

## 2018-10-29 LAB — MAGNESIUM: Magnesium: 1.6 mg/dL — ABNORMAL LOW (ref 1.7–2.4)

## 2018-10-29 MED ORDER — INSULIN ASPART 100 UNIT/ML ~~LOC~~ SOLN
1.0000 [IU] | SUBCUTANEOUS | Status: DC
  Administered 2018-10-29: 16:00:00 2 [IU] via SUBCUTANEOUS
  Administered 2018-10-29: 3 [IU] via SUBCUTANEOUS
  Administered 2018-10-30 – 2018-10-31 (×3): 1 [IU] via SUBCUTANEOUS
  Administered 2018-11-01: 3 [IU] via SUBCUTANEOUS
  Administered 2018-11-01: 2 [IU] via SUBCUTANEOUS
  Administered 2018-11-01: 1 [IU] via SUBCUTANEOUS
  Administered 2018-11-01 – 2018-11-02 (×2): 3 [IU] via SUBCUTANEOUS

## 2018-10-29 MED ORDER — POTASSIUM CHLORIDE 10 MEQ/100ML IV SOLN
INTRAVENOUS | Status: AC
  Administered 2018-10-29: 10 meq via INTRAVENOUS
  Filled 2018-10-29: qty 100

## 2018-10-29 MED ORDER — AMIODARONE LOAD VIA INFUSION
150.0000 mg | Freq: Once | INTRAVENOUS | Status: AC
  Administered 2018-10-29: 150 mg via INTRAVENOUS
  Filled 2018-10-29: qty 83.34

## 2018-10-29 MED ORDER — INSULIN DETEMIR 100 UNIT/ML ~~LOC~~ SOLN
10.0000 [IU] | Freq: Two times a day (BID) | SUBCUTANEOUS | Status: DC
  Administered 2018-10-29 – 2018-10-31 (×4): 10 [IU] via SUBCUTANEOUS
  Filled 2018-10-29 (×9): qty 0.1

## 2018-10-29 MED ORDER — LABETALOL HCL 5 MG/ML IV SOLN: 10.0000 mg | INTRAVENOUS | Status: DC | PRN

## 2018-10-29 MED ORDER — POTASSIUM CHLORIDE 10 MEQ/100ML IV SOLN
10.0000 meq | INTRAVENOUS | Status: AC
  Administered 2018-10-29 (×3): 10 meq via INTRAVENOUS
  Filled 2018-10-29: qty 100

## 2018-10-29 MED ORDER — METOCLOPRAMIDE HCL 5 MG/ML IJ SOLN
10.0000 mg | Freq: Four times a day (QID) | INTRAMUSCULAR | Status: DC
  Administered 2018-10-29 – 2018-11-01 (×9): 10 mg via INTRAVENOUS
  Filled 2018-10-29 (×11): qty 2

## 2018-10-29 MED ORDER — MAGNESIUM SULFATE 2 GM/50ML IV SOLN
2.0000 g | Freq: Once | INTRAVENOUS | Status: AC
  Administered 2018-10-29: 2 g via INTRAVENOUS
  Filled 2018-10-29: qty 50

## 2018-10-29 MED ORDER — AMIODARONE IV BOLUS ONLY 150 MG/100ML: 150.0000 mg | Freq: Once | INTRAVENOUS | Status: DC

## 2018-10-29 MED ORDER — ONDANSETRON HCL 4 MG/2ML IJ SOLN
4.0000 mg | INTRAMUSCULAR | Status: DC | PRN
  Administered 2018-10-29 (×2): 4 mg via INTRAVENOUS
  Filled 2018-10-29 (×3): qty 2

## 2018-10-29 MED ORDER — METOCLOPRAMIDE HCL 5 MG/ML IJ SOLN
20.0000 mg | Freq: Once | INTRAVENOUS | Status: AC
  Administered 2018-10-29: 20 mg via INTRAVENOUS
  Filled 2018-10-29: qty 4

## 2018-10-29 MED ORDER — LABETALOL HCL 5 MG/ML IV SOLN
10.0000 mg | Freq: Once | INTRAVENOUS | Status: AC
  Administered 2018-10-29: 15:00:00 20 mg via INTRAVENOUS

## 2018-10-29 MED ORDER — METOPROLOL TARTRATE 50 MG PO TABS
50.0000 mg | ORAL_TABLET | Freq: Two times a day (BID) | ORAL | Status: DC
  Administered 2018-10-31 – 2018-11-04 (×5): 50 mg via ORAL
  Filled 2018-10-29 (×6): qty 1

## 2018-10-29 MED ORDER — LABETALOL HCL 5 MG/ML IV SOLN
INTRAVENOUS | Status: AC
  Administered 2018-10-29: 15:00:00 20 mg via INTRAVENOUS
  Filled 2018-10-29: qty 4

## 2018-10-29 MED ORDER — LABETALOL HCL 5 MG/ML IV SOLN
10.0000 mg | INTRAVENOUS | Status: DC | PRN
  Filled 2018-10-29: qty 4

## 2018-10-29 MED ORDER — CHLORHEXIDINE GLUCONATE CLOTH 2 % EX PADS
6.0000 | MEDICATED_PAD | Freq: Every day | CUTANEOUS | Status: DC
  Administered 2018-10-29 – 2018-10-30 (×2): 6 via TOPICAL

## 2018-10-29 NOTE — Progress Notes (Signed)
EEG complete - results pending 

## 2018-10-29 NOTE — Progress Notes (Signed)
7FR rt femoral sheath removed using exoseal and manual pressure at 1105am.  Hemostasis obtained 1130am.  Tegaderm and gauze to site, and site reviewed with Lurline Hare.  No hematoma or complications.

## 2018-10-29 NOTE — Progress Notes (Signed)
Spoke with Dr. Oletta Darter MD during system downtime. Patient was still in rapid afib with rates in 130s-140s. Dr. Oletta Darter ordered another 150 mg bolus of amio which was given during downtime. Dr. Oletta Darter also ordered 20 meq IV potassium to be given. Repeat BMP and CBC ordered for AM labs.

## 2018-10-29 NOTE — Progress Notes (Signed)
Per Dr. Leonie Man titrate Cleviprex for BP <180/100.

## 2018-10-29 NOTE — Progress Notes (Signed)
SLP Cancellation Note  Patient Details Name: KENDEL BESSEY MRN: [id number] DOB: [birth year]   Cancelled treatment:       Reason Eval/Treat Not Completed: Medical issues which prohibited therapy(RN reported that the pt has been projectile vomiting and recommended that the speech-language evaluation be deferred at this time. SLP will follow up.)  Lamaria Hildebrandt I. Hardin Negus, [place], [place] Office number [phone] Pager [place] 10/29/2018, 9:06 AM

## 2018-10-29 NOTE — Progress Notes (Signed)
Pt has temp 102.2. MD notified. New orders received will continue to monitor.

## 2018-10-29 NOTE — Progress Notes (Signed)
STROKE TEAM PROGRESS NOTE   INTERVAL HISTORY Patient presented with unknown last seen normal presumably between 11am and 12 yesterday followed by global aphasia left gaze deviation and right cerebral angiogram large vessel occlusion was found blood glucose was found to be started on insulin drip.  Has shown slight neurological improvement overnight.  His left gaze deviation seems to have resolved not consistently following commands.  Right sided strength appears to be improving blood pressure is controlled on Cardene drip.  He is on insulin drip blood glucose is down to 122 right arterial groin sheath is being discontinued.  MRI scan is pending.  EEG is being done.  Follow-up CT scan done last night shows no evidence of left hemispheric infarct or post TPA hemorrhage Vitals:   10/29/18 1000 10/29/18 1015 10/29/18 1030 10/29/18 1045  BP:      Pulse: 72 76 73 77  Resp: 19 14 12 13   Temp:      TempSrc:      SpO2: 100% 99% 99% 96%  Weight:        CBC:  Recent Labs  Lab 10/28/18 1327 10/29/18 0530  WBC 9.4 16.1*  NEUTROABS 7.1 13.8*  HGB 9.9* 9.5*  HCT 30.8* 29.8*  MCV 87.5 87.1  PLT 123* 140*    Basic Metabolic Panel:  Recent Labs  Lab 10/28/18 2142 10/29/18 0530  NA 134* 133*  K 3.1* 3.6  CL 105 103  CO2 17* 20*  GLUCOSE 182* 198*  BUN 34* 30*  CREATININE 3.15* 3.30*  CALCIUM 8.9 9.0  MG 1.1* 1.6*   Lipid Panel:     Component Value Date/Time   CHOL 108 10/29/2018 0530   TRIG 55 10/29/2018 0530   HDL 49 10/29/2018 0530   CHOLHDL 2.2 10/29/2018 0530   VLDL 11 10/29/2018 0530   LDLCALC 48 10/29/2018 0530   HgbA1c:  Lab Results  Component Value Date   HGBA1C 13.6 (H) 10/29/2018   Urine Drug Screen: No results found for: LABOPIA, COCAINSCRNUR, LABBENZ, AMPHETMU, THCU, LABBARB  Alcohol Level No results found for: Paul [name] Head Code Stroke Wo Contrast 10/28/2018 1. No evidence of acute intracranial abnormality. 2. ASPECTS is 10. 3. Mild chronic small vessel  ischemic disease and cerebral atrophy.   Cerebral Angio 10/28/2018 1.No angiographic evidence of occlusions,stenosis,dissections,aneurysm,avm or A V shunting. 2.Venous outflow WNLs.  Ct Head Wo Contrast 10/29/2018 0351 Some intravascular contrast is present. Stable since yesterday and negative CT appearance of the brain.   Dg Chest Port 1 View 10/28/2018 No active disease.   Dg Abd 1 View 10/29/2018 Diffuse gaseous distention of bowel, favor ileus.    PHYSICAL EXAM Frail middle-aged Caucasian male who appears not to be in distress. . Afebrile. Head is nontraumatic. Neck is supple without bruit.    Cardiac exam no murmur or gallop. Lungs are clear to auscultation. Distal pulses are well felt except in the lower extremities..  Bilateral below-knee amputation Neurological Exam : Patient is awake.  He is aphasic.  He follows only occasional midline commands.  He speaks a few words perseverates.  Unable to name repeat and comprehend well.  Eyes in primary position.  He will not follow gaze but does have spontaneous eye movements.  Does not blink to threat on either side consistently.  Face appears symmetric.  Tongue midline.  Spontaneous extremity movements but more on the left right upper extremity.  Check due to" ASSESSMENT/PLAN Paul [name] is a 63 y.o. male with history of  diabetes, bilateral above the knee amputations, hypertension, blood transfusion, end-stage renal disease s/p kidney transplant presenting with inability to speak, R hemianopsia, L gaze preference, R hand weakness. Received IV tPA 10/28/2018 at 1339.  Severe hyperglycemia presenting as Possible L MCA infarct s/p tPA  Code Stroke CT head No acute stroke. Small vessel disease. Atrophy. ASPECTS 10.     Cerebral angio Unremarkable   Post IR CT CT head  CT head 6/11 0150 some intravascular contrast. Stable. Negative.   MRI no acute infarct  2D Echo Jan 2020 EF 50-55%. No source of embolus   Check EEG pending    LDL 48  HgbA1c 13.6  SCDs for VTE prophylaxis    aspirin 81 mg daily prior to admission, now on No antithrombotic as within 24 hours of TPA administration.  Plan resume aspirin if 24-hour imaging negative for hemorrhage.  Therapy recommendations: Pending  Disposition: Pending  Atrial Fibrillation w/ RVR  Home anticoagulation:  none    Initial dx in 2014 while under multiple stressors, Coumadin stopped in February 2015 due to concerns for Coumadin necrosis.  No recurrence of A. fib when last seen by Dr. Martinique in January 2017   Rates 130-140s in hospital  Treated with amio  Replace magnesium and potassium, now 1.6 and 3.6 respectively  Consider anticoagulation with DOACs at discharge   Hypertension  Home meds: Norvasc 10  BP goal per post tPA protocol x 24h following tPA administration  Currently on Cleviprex drip   Stable 100s . Long-term BP goal normotensive  Hyperlipidemia  Home meds: Lipitor 10, resumed in hospital  LDL 48, goal < 70  Continue statin at discharge  Diabetes type I Uncontrolled Diabetic Neuropathy  Home meds: Insulin glargine 20u  HgbA1c 13.6, goal < 7.0  Glucose elevated on admission at 656   now on insulin gtt  CBGs  SSI  Other Stroke Risk Factors  Advanced age  Family hx stroke (maternal grandmother)  PAD s/p B BKAs  Other Active Problems  CKD stage IV  s/p renal transplant on prograf. Cr 3.3  Anemia of chronic disease Hgb 9.5  Intermittent vomiting for several months, treated with Zofran  Increased lethargy and decreased p.o. intake over the past 2 weeks  Leukocytosis WBC 16.1  Hyponatremia NA 133  Hospital day # 1  I have personally obtained history,examined this patient, reviewed notes, independently viewed imaging studies, participated in medical decision making and plan of care.ROS completed by me personally and pertinent positives fully documented  I have made any additions or clarifications directly  to the above note. [name] He presented clinically like a left MCA infarct with NIH stroke scale of 19 but with severe hyperglycemia.  He received TPA imaging studies did not shows acute stroke.  He shown only slight improvement in exam so far but expected significant improvement  over the next day or 2.  Continue strict blood pressure control as per post TPA protocol.  Insulin drip and euglycemia .  Aggressive hydration.  Check MRI scan and EEG study.  Discussed with patient's wife over the phone and answered questions.  Discussed with Dr. Lynetta Mare This patient is critically ill and at significant risk of neurological worsening, death and care requires constant monitoring of vital signs, hemodynamics,respiratory and cardiac monitoring, extensive review of multiple databases, frequent neurological assessment, discussion with family, other specialists and medical decision making of high complexity.I have made any additions or clarifications directly to the above note.This critical care time does not reflect procedure  time, or teaching time or supervisory time of PA/NP/Med Resident etc but could involve care discussion time.  I spent 30 minutes of neurocritical care time  in the care of  this patient.     Antony Contras, MD Medical Director [facility] St[facility] Pager: [phone] 10/29/2018 2:22 PM   To contact Stroke Continuity provider, please refer to [email or web address] After hours, contact General Neurology

## 2018-10-29 NOTE — Procedures (Signed)
History: 63 year old male being evaluated for aphasia  Sedation: None  Technique: This is a 21 channel routine scalp EEG performed at the bedside with bipolar and monopolar montages arranged in accordance to the international 10/20 system of electrode placement. One channel was dedicated to EKG recording.    Background: There is a posterior dominant rhythm of 8 Hz which is relatively well sustained.  This rhythm, though seen bilaterally appears relatively attenuated on the left compared to the right.  There is generalized irregular delta and theta intrusion into the background as well.  There are also occasional bifrontal discharges with triphasic morphology, these are not truly periodic, but rather sporadic.  Photic stimulation: Physiologic driving is not performed  EEG Abnormalities: 1) asymmetric background with attenuation of faster frequencies on the left 2) generalized irregular slow activity 3) triphasic waves  Clinical Interpretation: This EEG is suggestive of a left hemispheric cortical dysfunction in the setting of a more generalized nonspecific cerebral dysfunction (encephalopathy).   There was no seizure or seizure predisposition recorded on this study. Please note that lack of epileptiform activity on EEG does not preclude the possibility of epilepsy.   Roland Rack, MD [facility] [phone]  If 7pm- 7am, please page neurology on call as listed in [place].

## 2018-10-29 NOTE — Progress Notes (Signed)
OT Cancellation Note  Patient Details Name: Paul [name] MRN: [id number] DOB: 02-12-1956   Cancelled Treatment:    Reason Eval/Treat Not Completed: Active bedrest order. Will reattempt.  Lucille Passy, OTR/L Acute Rehabilitation Services Pager [phone] Office [phone]   Lucille Passy M 10/29/2018, 4:22 PM

## 2018-10-29 NOTE — Progress Notes (Signed)
Referring Physician(s): Code Stroke- Leonie Man, Pramod S  Supervising Physician: Luanne Bras  Patient Status:  [facility] - In-pt  Chief Complaint: None  Subjective:  Acute CVA (left MCA acute infarct) s/p diagnostic cerebral arteriogram (findings revealed no evidence of occlusions, stenosis, dissections, aneurysms, AVM, or AV shunting) 10/28/2018 by Dr. Estanislado Pandy. Patient awake and alert laying in bed. Can spontaneously move all extremities. Does not follow commands and answers all questions with "yes" or "no". Right groin incision c/d/i.   Allergies: Metformin and related  Medications: Prior to Admission medications   Medication Sig Start Date End Date Taking? Authorizing Provider  amLODipine (NORVASC) 10 MG tablet Take 10 mg by mouth daily.    [provider]  aspirin EC 81 MG tablet Take 81 mg by mouth daily.    [provider]  atorvastatin (LIPITOR) 10 MG tablet Take 1 tablet (10 mg total) by mouth daily. Patient not taking: Reported on 10/28/2018 08/13/13   Angiulli, Lavon Paganini, PA-C  calcitRIOL (ROCALTROL) 0.25 MCG capsule Take 0.5 mcg by mouth daily.    [provider]  calcium acetate (PHOSLO) 667 MG capsule Take 1,334 mg by mouth 3 (three) times daily with meals.    [provider]  [employer] D3 50 MCG (2000 UT) CAPS Take 4,000 Units by mouth daily. 09/18/18   [provider]  Insulin Glargine (BASAGLAR KWIKPEN) 100 UNIT/ML SOPN Inject 0.2 mLs (20 Units total) into the skin daily. 05/25/18   Hosie Poisson, MD  ipratropium-albuterol (DUONEB) 0.5-2.5 (3) MG/3ML SOLN Take 3 mLs by nebulization every 6 (six) hours as needed. Patient not taking: Reported on 10/28/2018 05/25/18   Hosie Poisson, MD  magnesium chloride (SLOW-MAG) 64 MG TBEC SR tablet Take 2 tablets by mouth 2 (two) times daily. TAKE AT LEAST 2 HOURS SEPARATE FROM PROGRAF/TACROLIMUS 10/02/14   [provider]  mycophenolate (MYFORTIC) 180 MG EC tablet Take 360 mg by mouth 2  (two) times daily.     [provider]  omeprazole (PRILOSEC) 40 MG capsule Take 40 mg by mouth daily.  01/11/14   [provider]  predniSONE (DELTASONE) 5 MG tablet Take 5 mg by mouth daily with breakfast.     [provider]  sodium bicarbonate 650 MG tablet Take 1,950 mg by mouth 4 (four) times daily. 09/21/18   [provider]  tacrolimus (PROGRAF) 1 MG capsule Take 3-4 mg by mouth See admin instructions. Take 4 mg by mouth in the morning at 9 AM and 3 mg in the evening at 9 PM    [provider]  tamsulosin (FLOMAX) 0.4 MG CAPS capsule Take 0.4 mg by mouth daily after breakfast.     [provider]     Vital Signs: BP 106/66   Pulse 75   Temp 99.4 F (37.4 C) (Axillary)   Resp 13   Wt 157 lb 13.6 oz (71.6 kg)   SpO2 99%   BMI (P) 20.83 kg/m   Physical Exam Vitals signs and nursing note reviewed.  Constitutional:      General: He is not in acute distress.    Appearance: Normal appearance.  Pulmonary:     Effort: Pulmonary effort is normal. No respiratory distress.  Skin:    General: Skin is warm and dry.     Comments: Right groin incision soft without active bleeding or hematoma.  Neurological:     Mental Status: He is alert.     Comments: Alert and awake. He does not follow  commands and answers all questions with "yes" or "no". PERRL bilaterally. Can spontaneously move all extremities. Distal pulses 2+ bilaterally.     Imaging: Dg Abd 1 View  Result Date: 10/29/2018 CLINICAL DATA:  Vomiting EXAM: ABDOMEN - 1 VIEW COMPARISON:  None. FINDINGS: Diffuse gaseous distention of bowel, favor ileus. No organomegaly or free air. Vascular calcifications and surgical clips in the abdomen and pelvis. IMPRESSION: Diffuse gaseous distention of bowel, favor ileus. Electronically Signed   By: Rolm Baptise M.D.   On: 10/29/2018 08:59   Ct Head Wo Contrast  Result Date: 10/29/2018 CLINICAL DATA:  63 year old patient with code stroke  presentation status post tPA. EXAM: UNSPECIFIED TECHNIQUE: Contiguous axial images were obtained from the base of the skull through the vertex without intravenous contrast. COMPARISON:  10/28/2018 head CT. FINDINGS: Brain: There is intravascular contrast present. No intracranial mass effect. No ventriculomegaly. No acute intracranial hemorrhage identified. Gray-white matter differentiation throughout the brain appears stable. No acute or evolving infarct identified. No abnormal enhancement identified. Vascular: Calcified atherosclerosis at the skull base. The major intracranial vascular structures seem to be normally enhancing. Skull: Stable. Sinuses/Orbits: Trace paranasal sinus mucosal thickening. Tympanic cavities and mastoids remain clear. Other: No acute orbit or scalp soft tissue findings. IMPRESSION: Some intravascular contrast is present. Stable since yesterday and negative CT appearance of the brain. Electronically Signed   By: Genevie Ann M.D.   On: 10/29/2018 03:51   Dg Chest Port 1 View  Result Date: 10/28/2018 CLINICAL DATA:  Follow-up intubation EXAM: PORTABLE CHEST 1 VIEW COMPARISON:  05/23/2018 FINDINGS: Heart and mediastinal shadows are normal. Aortic atherosclerosis. The lungs are clear. No effusions. No bone abnormality. IMPRESSION: No active disease. Electronically Signed   By: Nelson Chimes M.D.   On: 10/28/2018 17:03   Ct Head Code Stroke Wo Contrast  Result Date: 10/28/2018 CLINICAL DATA:  Code stroke.  Aphasia.  Right-sided weakness. EXAM: CT HEAD WITHOUT CONTRAST TECHNIQUE: Contiguous axial images were obtained from the base of the skull through the vertex without intravenous contrast. COMPARISON:  05/24/2013 FINDINGS: Brain: There is no evidence of acute infarct, intracranial hemorrhage, mass, midline shift, or extra-axial fluid collection. There is mild cerebral atrophy. Cerebral white matter hypodensities are nonspecific but compatible with mild chronic small vessel ischemic disease.  Vascular: Calcified atherosclerosis at the skull base. No hyperdense vessel. Skull: No fracture or suspicious osseous lesion. Unchanged subcentimeter sclerotic focus in the right mandibular condyle, possibly a bone island. Sinuses/Orbits: Visualized paranasal sinuses and mastoid air cells are clear. Bilateral cataract extraction is noted. Other: None. ASPECTS [facility] Stroke Program Early CT Score) - Ganglionic level infarction (caudate, lentiform nuclei, internal capsule, insula, M1-M3 cortex): 7 - Supraganglionic infarction (M4-M6 cortex): 3 Total score (0-10 with 10 being normal): 10 IMPRESSION: 1. No evidence of acute intracranial abnormality. 2. ASPECTS is 10. 3. Mild chronic small vessel ischemic disease and cerebral atrophy. These results were communicated to Dr. Leonie Man at 1:25 pm on 10/28/2018 by text page via the [facility] messaging system. Electronically Signed   By: Logan Bores M.D.   On: 10/28/2018 13:25    Labs:  CBC: Recent Labs    05/23/18 1517 05/24/18 0544  09/16/18 1237 10/14/18 1250 10/28/18 1327 10/29/18 0530  WBC 13.5* 10.6*  --   --   --  9.4 16.1*  HGB 10.6* 9.1*   < > 8.6* 9.3* 9.9* 9.5*  HCT 34.0* 27.9*  --   --   --  30.8* 29.8*  PLT 232 182  --   --   --  123* 140*   < > = values in this interval not displayed.    COAGS: Recent Labs    10/28/18 1327  INR 1.2  APTT 25    BMP: Recent Labs    05/25/18 1152 10/28/18 1327 10/28/18 1637 10/28/18 2142 10/29/18 0530  NA 138 128*  --  134* 133*  K 3.8 4.2  --  3.1* 3.6  CL 112* 96*  --  105 103  CO2 15* 20*  --  17* 20*  GLUCOSE 206* 790* 656* 182* 198*  BUN 71* 32*  --  34* 30*  CALCIUM 7.3* 9.7  --  8.9 9.0  CREATININE 4.15* 3.31*  --  3.15* 3.30*  GFRNONAA 14* 18*  --  20* 19*  GFRAA 16* 21*  --  23* 22*    LIVER FUNCTION TESTS: Recent Labs    05/24/18 0331 10/28/18 1327  BILITOT 0.2* 0.9  AST 12* 11*  ALT 10 12  ALKPHOS 71 57  PROT 5.9* 5.3*  ALBUMIN 3.0* 3.3*    Assessment and Plan:   Acute CVA (left MCA acute infarct) s/p diagnostic cerebral arteriogram (findings revealed no evidence of occlusions, stenosis, dissections, aneurysms, AVM, or AV shunting) 10/28/2018 by Dr. Estanislado Pandy. Patient's condition stable- can move all extremities, does not follow commands, answers all questions with "yes" or "no". Right groin incision stable. Further plans per neurology- appreciate and agree with management. NIR to follow.   Electronically Signed: Earley Abide, PA-C 10/29/2018, 11:37 AM   I spent a total of 25 Minutes at the the patient's bedside AND on the patient's hospital floor or unit, greater than 50% of which was counseling/coordinating care for acute CVA s/p diagnostic cerebral arteriogram.

## 2018-10-29 NOTE — Progress Notes (Signed)
NAME:  Paul [name], MRN:  [id number], DOB:  18-Jun-1955, LOS: 1 ADMISSION DATE:  10/28/2018, CONSULTATION DATE:  10/28/18 REFERRING MD:  Dr Estanislado Pandy, CHIEF COMPLAINT:  med management  Brief History   63 yo cm presented with L MCA syndrome. given IV TPA and taken to IR for angio without intervention needed. Found to be confused and hyperglycemic post-procedure.   History of present illness   63 yo with pmh IDDM, ESRD, BKA bilateral coming presented from home after last being seen normal on video call at 1100 with colleague when he developed trouble talking and could not move his R arm. Wife found pt at 44 in the closet acting confused and unable to speak she called EMS. Arrived to ed altered with R facial droop, r side flaccid with neglect. ultimately given tpa at 1339.  Initial NIHSS 18 c/w L MCA symptoms taken to IR without stenosis or occlusion found to intervene.  We were called for med management post operatively  After speaking with wife pt states that over the past 2 weeks he has been much more lethargic and lack of po intake. She is unsure if he has been taking his insulin at home as he has been no eating well. They have been a bit concerned about his general malaise over the past 2 weeks. Increased sleeping. They were going to make appt to see a dr soon due to this. She states he has been vomiting intermittently for months, no recent n/v/f/c. She states that he wakes up around 4 or 5 and goes to bed around 1300. She does not wake up until 10-11 regularly so she is unaware of much of his recent activities.   Past Medical History   Past Medical History:  Diagnosis Date  . A-fib ([place])   . Anemia    esrd  . Calciphylaxis 05/2013   lower ext  . Cancer ([place])    hx duodenal adenoCa 2002, resected  . Closed left arm fracture 1967  . Corneal abrasion, left   . Depression   . Diabetes mellitus    Type 1 iddm x 11 yrs  . Diabetic neuropathy ([place])   . ESRD on hemodialysis ([place])    Pt  has ESRD due to DM.  He had a L upper arm AVF prior to starting HD which was ligated due to L arm steal syndrome.  He started HD in Jan 2014 and did home HD.  The family couldn't cannulate the R arm AVF successfully so by mid 2014 they decided to switch to PD which was done late summer 2014.  In Jan 2015 he was admitted with FTT felt to be due to underdialysis on PD and PD was abandoned and he   . GERD (gastroesophageal reflux disease)   . Glomerulosclerosis, diabetic ([place])   . Heart murmur   . History of blood transfusion   . Hypertension    off of meds due to orthostatic hypotension  . Kidney transplant recipient   . Open wound of both legs with complication    Pt has had progressive wounds of both LE's including gangrene of the toes and patchy necrosis of the calves.  He has been treated at [facility] by Dr Jerline Pain with hyperbaric O2 5d per week.  He is getting Na Thiosulfate with HD for suspected calciphylaxis. Pt says doctor's aren't sure if these ulcers were diabetic ulcers or calciphylaxis.  He underwent bilat BKA on 07/30/13.  He says that the woun  Significant Hospital Events   6/10: L MCA syndrome  Consults:  6/10 IR 6/10 CCM  Procedures:  6/10:TPA given  Significant Diagnostic Tests:  6/10: cth: 1. No evidence of acute intracranial abnormality. 2. ASPECTS is 10. 3. Mild chronic small vessel ischemic disease and cerebral atrophy. 6/11: KUB (personally interpreted) diffuse colonic dilatation consistent with ileus.  Micro Data:  6/10 covid: negative  Antimicrobials:  n/a  Interim history/subjective:  Continues to have garbled speech. AF with RVR overnight treated with amiodarone infusion.   Objective   Blood pressure 106/66, pulse 77, temperature 99.4 F (37.4 C), temperature source Axillary, resp. rate 13, weight 71.6 kg, SpO2 96 %.        Intake/Output Summary (Last 24 hours) at 10/29/2018 1048 Last data filed at 10/29/2018 0957 Gross per 24 hour  Intake 3226.1  ml  Output 25 ml  Net 3201.1 ml   Filed Weights   10/28/18 1300 10/28/18 1352  Weight: 71.6 kg 71.6 kg    Examination: General: no acute distress, awake but aphasic HEENT: dry mucous membranes. Lungs: CTA, no wheezes, rhonchi, rales Cardiovascular: RRR, no m/g/r Abdomen: soft, NT,ND, BS+ Extremities: + edema Skin: no rashes, warm and dry Neuro: Speech remains incomprehensible.  Able to keep arms up antigravity bilaterally. GU: foley  Resolved Hospital Problem list   none  Assessment & Plan:  Suspected pseudo LMCA sydrome 2/2 hyperglycemia vs sz vs cva that perhaps tpa resolved:  -s/p iv tpa -s/p ir agio without need for intervention -cont neuro exams -Maintain euglycemia  S/p DDKT with chronic AbMR and IFTA, now with CKD IV:  P: -meeting with [facility] in July to restart process for 2nd transplant -not on dialysis and NOT anuric -may warrant renal consult in am -cont hydration -on chronic immunosuppression/steroids/prograf/cellcept  DM1 with hyperglycemia -Hyperglycemic hyperosmolar state without ketosis.  Now resolving. P:  Transition to subcutaneous insulin  N/V-unexplained but likely precipitant for hyperglycemia and stroke syndrome. Colonic ileus seen on x-ray may be cause or result of hyperglycemia.  May be related to diabetic gastroparesis. P:  -Switch Zofran to scheduled metoclopramide. -Follow electrolytes. -Maintaining euvolemia. -Nasogastric suctioning -Colonic dysmotility may improve with better glycemic control -At risk for aspiration.  IR to remove arterial sheath to allow patient to sit up.  Atrial fibrillation with rapid ventricular response.  Now in sinus rhythm -Complete 24-hour amiodarone infusion protocol.  Best practice:  Diet: npo  Pain/Anxiety/Delirium protocol (if indicated): per protocol VAP protocol (if indicated): per protocol DVT prophylaxis: per neuro GI prophylaxis:  Glucose control: insulin infusion Mobility: bed Code Status:  full Family Communication: wife updated by phone 6/11. Disposition: icu  Labs   CBC: Recent Labs  Lab 10/28/18 1327 10/29/18 0530  WBC 9.4 16.1*  NEUTROABS 7.1 13.8*  HGB 9.9* 9.5*  HCT 30.8* 29.8*  MCV 87.5 87.1  PLT 123* 140*    Basic Metabolic Panel: Recent Labs  Lab 10/28/18 1327 10/28/18 1637 10/28/18 2142 10/29/18 0530  NA 128*  --  134* 133*  K 4.2  --  3.1* 3.6  CL 96*  --  105 103  CO2 20*  --  17* 20*  GLUCOSE 790* 656* 182* 198*  BUN 32*  --  34* 30*  CREATININE 3.31*  --  3.15* 3.30*  CALCIUM 9.7  --  8.9 9.0  MG  --   --  1.1* 1.6*   GFR: Estimated Creatinine Clearance: 22.6 mL/min (A) (by C-G formula based on SCr of 3.3 mg/dL (H)). Recent Labs  Lab 10/28/18 1327 10/29/18 0530  WBC 9.4 16.1*    Liver Function Tests: Recent Labs  Lab 10/28/18 1327  AST 11*  ALT 12  ALKPHOS 57  BILITOT 0.9  PROT 5.3*  ALBUMIN 3.3*   Recent Labs  Lab 10/28/18 1830  LIPASE 23  AMYLASE 28   No results for input(s): AMMONIA in the last 168 hours.  ABG    Component Value Date/Time   TCO2 31 06/14/2013 0627     Coagulation Profile: Recent Labs  Lab 10/28/18 1327  INR 1.2    Cardiac Enzymes: No results for input(s): CKTOTAL, CKMB, CKMBINDEX, TROPONINI in the last 168 hours.  HbA1C: Hgb A1c MFr Bld  Date/Time Value Ref Range Status  10/29/2018 05:29 AM 13.6 (H) 4.8 - 5.6 % Final    Comment:    (NOTE) Pre diabetes:          5.7%-6.4% Diabetes:              >6.4% Glycemic control for   <7.0% adults with diabetes   05/24/2018 01:00 AM 7.9 (H) 4.8 - 5.6 % Final    Comment:    (NOTE) Pre diabetes:          5.7%-6.4% Diabetes:              >6.4% Glycemic control for   <7.0% adults with diabetes     CBG: Recent Labs  Lab 10/29/18 0616 10/29/18 0719 10/29/18 0824 10/29/18 0944 10/29/18 1034  GLUCAP 158* 122* 113* [address], MD [facility] ICU Physician [place]  Pager: [phone] Mobile:  [phone] After hours: [phone].  10/29/2018, 10:59 AM

## 2018-10-29 NOTE — Progress Notes (Signed)
PT Cancellation Note  Patient Details Name: Paul [name] MRN: [id number] DOB: August 04, 1955   Cancelled Treatment:    Reason Eval/Treat Not Completed: Active bedrest order   Ellamae Sia, PT, DPT Acute Rehabilitation Services Pager [phone] Office [phone]    Willy Eddy 10/29/2018, 8:09 AM

## 2018-10-29 NOTE — Evaluation (Signed)
Speech Language Pathology Evaluation Patient Details Name: Paul [name] MRN: [id number] DOB: 01-04-1956 Today's Date: 10/29/2018 Time: 5631-4970 SLP Time Calculation (min) (ACUTE ONLY): 12 min  Problem List:  Patient Active Problem List   Diagnosis Date Noted  . Stroke ([place]) 10/28/2018  . Middle cerebral artery embolism, left 10/28/2018  . Metabolic acidosis 26/37/8588  . Dehydration 05/24/2018  . Pressure injury of skin 05/24/2018  . Dyspnea 05/23/2018  . Acute respiratory failure with hypoxia ([place]) 05/23/2018  . Abnormal cardiovascular stress test 12/29/2013  . Awaiting organ transplant 12/09/2013  . Leg ulcer ([place]) 11/17/2013  . Chronic kidney disease (CKD), stage V ([place]) 11/17/2013  . Diabetes ([place]) 11/17/2013  . Cancer of duodenum ([place]) 11/17/2013  . Dyslipidemia 11/17/2013  . Chronic kidney disease requiring chronic dialysis ([place]) 11/17/2013  . Hypercholesteremia 11/17/2013  . H/O malignant neoplasm 11/17/2013  . H/O: HTN (hypertension) 11/17/2013  . BP (high blood pressure) 11/17/2013  . Hypotension 11/17/2013  . Angiopathy, peripheral ([place]) 11/17/2013  . AF (paroxysmal atrial fibrillation) ([place]) 11/17/2013  . BKA stump complication ([place]) 50/27/7412  . S/P bilateral BKA (below knee amputation) ([place]) 08/03/2013  . S/P BKA (below knee amputation) bilateral ([place]) 07/30/2013  . Open wound of both legs with complication 87/86/7672  . Acute blood loss anemia 07/12/2013  . Syncope 07/09/2013  . Intolerance to CAPD peritoneal dialysis s/p CAPD cath removal 07/09/2013 07/05/2013  . Critical lower limb ischemia 06/30/2013  . Extremity pain 06/30/2013  . Atherosclerosis of native arteries of the extremities with ulceration(440.23) 06/17/2013  . Gait disorder 05/31/2013  . Weakness 05/24/2013  . Depression 04/03/2013  . GERD (gastroesophageal reflux disease) 04/03/2013  . Atrial fibrillation ([place]) 04/02/2013  . DM (diabetes mellitus) ([place]) 12/28/2012  . Other complications  due to renal dialysis device, implant, and graft 05/19/2012  . Diabetes mellitus, type 2 ([place]) 11/13/2011  . Anemia 07/02/2011  . ESRD on hemodialysis ([place]) 05/29/2011   Past Medical History:  Past Medical History:  Diagnosis Date  . A-fib ([place])   . Anemia    esrd  . Calciphylaxis 05/2013   lower ext  . Cancer ([place])    hx duodenal adenoCa 2002, resected  . Closed left arm fracture 1967  . Corneal abrasion, left   . Depression   . Diabetes mellitus    Type 1 iddm x 11 yrs  . Diabetic neuropathy ([place])   . ESRD on hemodialysis ([place])    Pt has ESRD due to DM.  He had a L upper arm AVF prior to starting HD which was ligated due to L arm steal syndrome.  He started HD in Jan 2014 and did home HD.  The family couldn't cannulate the R arm AVF successfully so by mid 2014 they decided to switch to PD which was done late summer 2014.  In Jan 2015 he was admitted with FTT felt to be due to underdialysis on PD and PD was abandoned and he   . GERD (gastroesophageal reflux disease)   . Glomerulosclerosis, diabetic ([place])   . Heart murmur   . History of blood transfusion   . Hypertension    off of meds due to orthostatic hypotension  . Kidney transplant recipient   . Open wound of both legs with complication    Pt has had progressive wounds of both LE's including gangrene of the toes and patchy necrosis of the calves.  He has been treated at [facility] by Dr Jerline Pain with hyperbaric O2 5d per week.  He  is getting Na Thiosulfate with HD for suspected calciphylaxis. Pt says doctor's aren't sure if these ulcers were diabetic ulcers or calciphylaxis.  He underwent bilat BKA on 07/30/13.  He says that the woun   Past Surgical History:  Past Surgical History:  Procedure Laterality Date  . AMPUTATION Bilateral 07/30/2013   Procedure: AMPUTATION BELOW KNEE;  Surgeon: Newt Minion, MD;  Location: [facility];  Service: Orthopedics;  Laterality: Bilateral;  Bilateral Below Knee Amputations  . AMPUTATION Bilateral  09/03/2013   Procedure: AMPUTATION BELOW KNEE;  Surgeon: Newt Minion, MD;  Location: [facility];  Service: Orthopedics;  Laterality: Bilateral;  Bilateral Below Knee Amputation Revision  . AV FISTULA PLACEMENT  06/07/2011   Procedure: ARTERIOVENOUS (AV) FISTULA CREATION;  Surgeon: Angelia Mould, MD;  Location: [facility];  Service: Vascular;  Laterality: Left;  . AV FISTULA PLACEMENT  06/18/2012   Procedure: ARTERIOVENOUS (AV) FISTULA CREATION;  Surgeon: Mal Misty, MD;  Location: [facility];  Service: Vascular;  Laterality: Right;  [place]  . BILIARY DIVERSION, EXTERNAL  2002  . BONE MARROW BIOPSY  2012  . CAPD INSERTION N/A 01/14/2013   Procedure: LAPAROSCOPIC INSERTION CONTINUOUS AMBULATORY PERITONEAL DIALYSIS  (CAPD) CATHETER;  Surgeon: Adin Hector, MD;  Location: [facility];  Service: General;  Laterality: N/A;  . CAPD REMOVAL N/A 07/09/2013   Procedure: CONTINUOUS AMBULATORY PERITONEAL DIALYSIS  (CAPD) CATHETER REMOVAL;  Surgeon: Adin Hector, MD;  Location: [facility];  Service: General;  Laterality: N/A;  . COLONOSCOPY     Hx: of  . INSERTION OF DIALYSIS CATHETER  05/22/2012   Procedure: INSERTION OF DIALYSIS CATHETER;  Surgeon: Mal Misty, MD;  Location: [facility];  Service: Vascular;  Laterality: N/A;  right internal jugular vein  . LIGATION OF ARTERIOVENOUS  FISTULA  05/22/2012   Procedure: LIGATION OF ARTERIOVENOUS  FISTULA;  Surgeon: Mal Misty, MD;  Location: [facility];  Service: Vascular;  Laterality: Left;  Left brachio-cephalic fistula  . LOWER EXTREMITY ANGIOGRAM N/A 06/14/2013   Procedure: LOWER EXTREMITY ANGIOGRAM;  Surgeon: Angelia Mould, MD;  Location[facility];  Service: Cardiovascular;  Laterality: N/A;  . NEPHRECTOMY TRANSPLANTED ORGAN    . OTHER SURGICAL HISTORY     Cyst removed from back  . PORTACATH PLACEMENT  2002  . RADIOLOGY WITH ANESTHESIA N/A 10/28/2018   Procedure: IR WITH ANESTHESIA;  Surgeon: Radiologist, Medication, MD;  Location: [facility];  Service: Radiology;   Laterality: N/A;  . REMOVAL OF A DIALYSIS CATHETER    . RENAL BIOPSY, PERCUTANEOUS  2012  . TONSILLECTOMY    . VASCULAR SURGERY    . WHIPPLE PROCEDURE  2002   duodenal ca, Dr. Harlow Asa   HPI:  Pt is a 63 y.o. male with history of bilateral amputation above knee, hypertension, blood transfusion, end-stage renal disease, and currently on the kidney transplant recipient list.  He was admitted secondary to inability to speak and hemianopsia. CT of the head was negative for acute changes. MRI of the brain has not been completed as yet.    Assessment / Plan / Recommendation Clinical Impression  Pt presents with moderate to severe non-fluent aphasia characterized by deficits in both receptive and expressive lanaguage with more pronounced difficulty with verbal expression. Receptively he was able to answer simple yes/no questions but exhibited difficulty with complex yes/no questions and consistently following 1-step commands. Pt frequently produced single-word utterances which were limited to "no", "yeah", "baby", "maybe", and "okay" but occasionally responded "I can't"  during the assessment as well. He was unable to demonstrate naming, automatic sequences, and repetition. He exhibited perserveration during attempts at word retrieval and neologistic paraphasias were demonstrated. Pt's limited verbal output was not characterized by imprecise articulation during this evaluation. His cognition could not be reliably assessed due to his impaired auditory comprehension and verbal expression. Skilled SLP services are clinically indicated at this time to improve communication.     SLP Assessment  SLP Recommendation/Assessment: Patient needs continued Speech Lanaguage Pathology Services SLP Visit Diagnosis: Aphasia (R47.01)    Follow Up Recommendations  Inpatient Rehab    Frequency and Duration min 2x/week  2 weeks      SLP Evaluation Cognition  Overall Cognitive Status: Difficult to assess(Due to  aphasia) Arousal/Alertness: Awake/alert Orientation Level: Oriented to person       Comprehension  Auditory Comprehension Overall Auditory Comprehension: Impaired Yes/No Questions: Impaired Basic Biographical Questions: (4/5) Complex Questions: (3/5) Commands: Impaired One Step Basic Commands: (0/4) Two Step Basic Commands: (0/4) Conversation: Simple Visual Recognition/Discrimination Discrimination: Not tested Reading Comprehension Reading Status: Not tested    Expression Expression Primary Mode of Expression: Verbal Verbal Expression Overall Verbal Expression: Impaired Initiation: Impaired Automatic Speech: Counting;Day of week;Month of year(Unable to complete) Level of Generative/Spontaneous Verbalization: Word Repetition: Impaired Level of Impairment: Word level Naming: Impairment Responsive: Not tested Confrontation: Impaired(0/5) Convergent: (Sentence completion: 0/5) Divergent: Not tested Verbal Errors: Neologisms;Perseveration Interfering Components: Attention   Oral / Motor  Oral Motor/Sensory Function Overall Oral Motor/Sensory Function: Within functional limits Motor Speech Overall Motor Speech: Appears within functional limits for tasks assessed(Limited verbal output did not reveal dysarthria) Respiration: Within functional limits Phonation: Normal Resonance: Within functional limits Articulation: Within functional limitis(Sample size was very small) Intelligibility: Intelligible   Teriah Muela I. Hardin Negus, [place], [place] Office number [phone] Pager [place] 10/29/2018, 10:42 AM

## 2018-10-29 NOTE — Progress Notes (Addendum)
Inpatient Diabetes Program Recommendations  AACE/ADA: New Consensus Statement on Inpatient Glycemic Control (2015)  Target Ranges:  Prepandial:   less than 140 mg/dL      Peak postprandial:   less than 180 mg/dL (1-2 hours)      Critically ill patients:  140 - 180 mg/dL   Lab Results  Component Value Date   GLUCAP 189 (H) 10/29/2018   HGBA1C 13.6 (H) 10/29/2018    Review of Glycemic Control Results for Paul [name], Paul [name] (MRN [id number]) as of 10/29/2018 13:41  Ref. Range 10/29/2018 07:19 10/29/2018 08:24 10/29/2018 09:44 10/29/2018 10:34 10/29/2018 11:28  Glucose-Capillary Latest Ref Range: 70 - 99 mg/dL 122 (H) 113 (H) 174 (H) 177 (H) 189 (H)   Diabetes history: Type 1 DM per H&P Outpatient Diabetes medications:  Basaglar 20 units daily Current orders for Inpatient glycemic control:  Levemir 10 units bid Novolog 1-3 units q 4 hours Inpatient Diabetes Program Recommendations:   Agree with current orders.  Will need to speak with patient regarding A1C.  Unclear if he was taking insulin consistently prior to admit.  Will follow.   Thanks  Adah Perl, RN, BC-ADM Inpatient Diabetes Coordinator Pager [phone] (8a-5p)

## 2018-10-29 NOTE — Progress Notes (Signed)
Pt had vomiting episode, with CCM MD at bedside. New orders obtained. pt alert and follows commands intermittently but unable to make sentences.

## 2018-10-29 NOTE — Progress Notes (Signed)
Per Dr. Leonie Man and Dr. Lynetta Mare ok to insert NG tube in now.

## 2018-10-29 NOTE — Progress Notes (Signed)
Cuff pressure and a line pressure systolic reading wide range. MAPs are correlating. Clarified with MD.  Per Dr. Lynetta Mare give labetalol only if MAO is greater than 110.Will continue to monitor.

## 2018-10-29 NOTE — Progress Notes (Signed)
  Speech Language Pathology Treatment: Cognitive-Linquistic(Aphasia)  Patient Details Name: Paul [name] MRN: [id number] DOB: 1955-08-19 Today's Date: 10/29/2018 Time: 1023-1034 SLP Time Calculation (min) (ACUTE ONLY): 11 min  Assessment / Plan / Recommendation Clinical Impression  Pt was seen for aphasia treatment and was cooperative during the session but slightly more lethargic than during the evaluation. He followed 1-step commands with 20% accuracy with tactile cues were provided. He did not produce any automatic sequences or demonstrate confrontational naming despite consistent phonemic and orthographic cues. He demonstrated 0% accuracy with simple phrase completion despite max verbal cues. He responded to yes/no questions regarding to his needs and pain but stated, "I can't" when more complex questions were presented. SLP will continue to follow pt.    HPI HPI: Pt is a 63 y.o. male with history of bilateral amputation above knee, hypertension, blood transfusion, end-stage renal disease, and currently on the kidney transplant recipient list.  He was admitted secondary to inability to speak and hemianopsia. CT of the head was negative for acute changes. MRI of the brain has not been completed as yet.       SLP Plan     Patient needs continued Speech Lanaguage Pathology Services    Recommendations                   Follow up Recommendations: Inpatient Rehab SLP Visit Diagnosis: Aphasia (R47.01)       Amy Belloso I. Hardin Negus, [place], [place] Office number [phone] Pager [place] 10/29/2018, 10:52 AM

## 2018-10-30 ENCOUNTER — Inpatient Hospital Stay (HOSPITAL_COMMUNITY): Payer: Managed Care, Other (non HMO)

## 2018-10-30 LAB — GLUCOSE, CAPILLARY
Glucose-Capillary: 109 mg/dL — ABNORMAL HIGH (ref 70–99)
Glucose-Capillary: 113 mg/dL — ABNORMAL HIGH (ref 70–99)
Glucose-Capillary: 129 mg/dL — ABNORMAL HIGH (ref 70–99)
Glucose-Capillary: 61 mg/dL — ABNORMAL LOW (ref 70–99)
Glucose-Capillary: 64 mg/dL — ABNORMAL LOW (ref 70–99)
Glucose-Capillary: 71 mg/dL (ref 70–99)
Glucose-Capillary: 93 mg/dL (ref 70–99)
Glucose-Capillary: 96 mg/dL (ref 70–99)

## 2018-10-30 LAB — TRIGLYCERIDES: Triglycerides: 57 mg/dL (ref <150)

## 2018-10-30 MED ORDER — SODIUM CHLORIDE 0.9 % IV BOLUS
1000.0000 mL | Freq: Once | INTRAVENOUS | Status: AC
  Administered 2018-10-30: 1000 mL via INTRAVENOUS

## 2018-10-30 MED ORDER — DEXTROSE 50 % IV SOLN
12.5000 g | Freq: Once | INTRAVENOUS | Status: AC
  Administered 2018-10-30: 17:00:00 12.5 g via INTRAVENOUS

## 2018-10-30 MED ORDER — ENOXAPARIN SODIUM 30 MG/0.3ML ~~LOC~~ SOLN
30.0000 mg | SUBCUTANEOUS | Status: DC
  Administered 2018-10-30 – 2018-11-04 (×6): 30 mg via SUBCUTANEOUS
  Filled 2018-10-30 (×6): qty 0.3

## 2018-10-30 MED ORDER — MAGNESIUM SULFATE 2 GM/50ML IV SOLN
2.0000 g | Freq: Once | INTRAVENOUS | Status: AC
  Administered 2018-10-30: 2 g via INTRAVENOUS
  Filled 2018-10-30: qty 50

## 2018-10-30 MED ORDER — DEXTROSE-NACL 5-0.9 % IV SOLN
INTRAVENOUS | Status: DC
  Administered 2018-10-30 – 2018-10-31 (×2): via INTRAVENOUS

## 2018-10-30 MED ORDER — DEXTROSE 50 % IV SOLN
12.5000 g | Freq: Once | INTRAVENOUS | Status: AC
  Administered 2018-10-30: 12.5 g via INTRAVENOUS
  Filled 2018-10-30: qty 50

## 2018-10-30 NOTE — Progress Notes (Signed)
  Speech Language Pathology Treatment: Cognitive-Linquistic(Aphasia )  Patient Details Name: Paul [name] MRN: [id number] DOB: 1955/08/29 Today's Date: 10/30/2018 Time: 1030-1046 SLP Time Calculation (min) (ACUTE ONLY): 16 min  Assessment / Plan / Recommendation Clinical Impression  Pt was seen for aphasia treatment and demonstrated reduced verbal output compared to that which was noted yesterday. He followed 1-step commands with 20% accuracy but no improvement was noted with use of tactile cues. He did not respond to any yes/no questions despite mod-max  encouragement cues. Pt did not produce automatic sequences, complete verbally-presented phrases, or participate in confrontational naming despite max cues. Pt was educated regarding goals of therapy with regards to his prior level of function. Considering the fact that following multiple instances of education and encouragement to participate that the pt smiled at the SLP and then proceeded to close his eyes and turn his head away, the possibility of his being unwilling to participate in treatment during this session is also considered. SLP will continue to follow pt.    HPI HPI: Pt is a 63 y.o. male with history of bilateral amputation above knee, hypertension, blood transfusion, end-stage renal disease, and currently on the kidney transplant recipient list.  He was admitted secondary to inability to speak and hemianopsia. CT of the head was negative for acute changes. MRI of the brain has not been completed as yet.       SLP Plan  Continue with current plan of care       Recommendations                   Follow up Recommendations: Inpatient Rehab SLP Visit Diagnosis: Aphasia (R47.01) Plan: Continue with current plan of care       Jonise Weightman I. Hardin Negus, [place], [place] Office number [phone] Pager [place] 10/30/2018, 10:52 AM

## 2018-10-30 NOTE — Evaluation (Signed)
Occupational Therapy Evaluation Patient Details Name: Paul [name] MRN: [id number] DOB: [birth year] Today's Date: 10/30/2018    History of Present Illness This 63 y.o. male admitted with AMS,  aphasia, Rt sided weakness, and Lt gaze preference.   CT of head showed no acute abnormality.  tPA was administered.   Pt was prepped for thrombectomy due to suspected MCA occlusion, but no occlusion was found.  MRI showed no acute abnormality.  Blood glucose was 790 on admission.  PMH includes:  IDDM, Bil. BKA (with prostheses), A-Fib, h/o duodenal adenocarcinoma s/p whipple procedure, Diabetic neuropathy, ESRD, s/p renal transplant, HTN    Clinical Impression   Pt admitted with above. He demonstrates the below listed deficits and will benefit from continued OT to maximize safety and independence with BADLs.  Pt seen with PT.  Pt demonstrates significant cognitive impairment.  He has focused attention for up to 7-8 seconds, and follows one step commands in context of activity 35-40% of time.  He appears to have Rt perceptual deficits as he tends to reach for objects not present and undershoots and overshoots when reaching for objects on the Rt.   He currently requires max - total A for ADLs, and mod A +2- min A +2 for functional mobility.  PTA, he was mod I with ambulation, was working as a Civil engineer, contracting, and was mod I with ADLs.  He lives with his wife, who is supportive.  Recommend CIR level therapies to allow him to maximize his independence with ADLs, reduce risk of falls, and readmission.       Follow Up Recommendations  CIR;Supervision/Assistance - 24 hour    Equipment Recommendations  None recommended by OT    Recommendations for Other Services Rehab consult     Precautions / Restrictions Precautions Precautions: Fall      Mobility Bed Mobility Overal bed mobility: Needs Assistance Bed Mobility: Supine to Sit;Sit to Supine     Supine to sit: Max assist;+2 for safety/equipment Sit to  supine: Mod assist   General bed mobility comments: Pt required max A to initiate activity, and was unable to problem solve or motor plan how to move to EOB. Required assist to scoot LEs to EOB and to lift trunk   Transfers Overall transfer level: Needs assistance Equipment used: Rolling walker (2 wheeled) Transfers: Sit to/from [facility] Sit to Stand: Mod assist;+2 physical assistance;+2 safety/equipment;From elevated surface Stand pivot transfers: Min assist;+2 physical assistance;+2 safety/equipment       General transfer comment: Pt requires bed height to be elevated and assist to power up into standing.  Assist for balance     Balance Overall balance assessment: Needs assistance Sitting-balance support: Feet unsupported;Single extremity supported Sitting balance-Leahy Scale: Fair Sitting balance - Comments: able to maintain static sitting with close min guard assist    Standing balance support: Bilateral upper extremity supported Standing balance-Leahy Scale: Poor Standing balance comment: requires bil. UE support and min A                            ADL either performed or assessed with clinical judgement   ADL Overall ADL's : Needs assistance/impaired Eating/Feeding: Total assistance;Bed level   Grooming: Wash/dry hands;Wash/dry face;Oral care;Brushing hair;Total assistance;Sitting   Upper Body Bathing: Total assistance;Sitting   Lower Body Bathing: Total assistance   Upper Body Dressing : Total assistance;Sitting   Lower Body Dressing: Total assistance;Sit to/from stand   Toilet Transfer: Moderate assistance;+2  for physical assistance;+2 for safety/equipment;Stand-pivot;BSC;RW   Toileting- Clothing Manipulation and Hygiene: Total assistance;Sit to/from stand       Functional mobility during ADLs: Moderate assistance;+2 for physical assistance;+2 for safety/equipment General ADL Comments: Attempted to have pt don prostheses.  with  max cues he would initiate donning sleeves, but would become distracted and unable to complete task      Vision   Additional Comments: Pt unable to participate in formal assessment      Perception Perception Perception Tested?: Yes Comments: Pt frequently moving prostheses sleeves to his Rt, over shooting and undershooting for objects on Rt and reaching for objects not present on Rt    Praxis Praxis Praxis tested?: Deficits Deficits: Initiation;Organization    Pertinent Vitals/Pain Pain Assessment: Faces Faces Pain Scale: No hurt     Hand Dominance Right   Extremity/Trunk Assessment Upper Extremity Assessment Upper Extremity Assessment: LUE deficits/detail;RUE deficits/detail RUE Deficits / Details: Pt demonstrates AROM grossly WFL.  He is unable to participate in formal assessment.  He appears to have Rt perceptual deficits and missed the RW several times when attempting to reach for it - could also be secondary to sensory deficit  RUE Coordination: decreased gross motor;decreased fine motor LUE Deficits / Details: Pt able to touch top of his head on command, he will start to lift his UE up over his head, but becomes distracted and unable to complete - unsure if this could be compounded by weakness as he is unable to participate in formal assessment    Lower Extremity Assessment Lower Extremity Assessment: Defer to PT evaluation   Cervical / Trunk Assessment Cervical / Trunk Assessment: Normal   Communication Communication Communication: Receptive difficulties;Expressive difficulties   Cognition Arousal/Alertness: Awake/alert Behavior During Therapy: Flat affect;Impulsive Overall Cognitive Status: Impaired/Different from baseline Area of Impairment: Attention;Following commands                   Current Attention Level: Focused   Following Commands: Follows one step commands inconsistently;Follows one step commands with increased time       General Comments:  Pt is able to state his name with moderate prompting.  He was able to recognize OP PT who is very familiar to him.  He follows one step commands ~35-40% of time with a delay and instruction repeated.  He demonstrates focused attention up to 7-8 seconds.    General Comments       Exercises     Shoulder Instructions      Home Living Family/patient expects to be discharged to:: Private residence Living Arrangements: Spouse/significant other Available Help at Discharge: Family Type of Home: Apartment Home Access: Ramped entrance     Home Layout: Able to live on main level with bedroom/bathroom     Bathroom Shower/Tub: Occupational psychologist: Standard Bathroom Accessibility: Yes   Home Equipment: [place] - single point;Shower seat;Wheelchair - manual;Crutches          Prior Functioning/Environment Level of Independence: Independent with assistive device(s)        Comments: Intermittently uses cane for outdoor ambulation. 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        OT Problem List: Decreased range of motion;Decreased strength;Decreased activity tolerance;Impaired balance (sitting and/or standing);Impaired vision/perception;Decreased coordination;Decreased cognition;Decreased safety awareness;Decreased knowledge of use of DME or AE      OT Treatment/Interventions: Self-care/ADL training;Neuromuscular education;DME and/or AE instruction;Therapeutic activities;Cognitive remediation/compensation;Visual/perceptual remediation/compensation;Patient/family education;Balance training    OT Goals(Current goals can be  found in the care plan section) Acute Rehab OT Goals OT Goal Formulation: Patient unable to participate in goal setting Time For Goal Achievement: 11/13/18 Potential to Achieve Goals: Good ADL Goals Pt Will Perform Eating: with set-up;with supervision;sitting Pt Will Perform Grooming: with supervision;with set-up;sitting Pt Will Perform Upper Body  Bathing: with min assist;sitting Pt Will Perform Lower Body Bathing: with mod assist;sit to/from stand Pt Will Transfer to Toilet: with min assist;ambulating;regular height toilet;bedside commode;grab bars Pt Will Perform Toileting - Clothing Manipulation and hygiene: with min assist;sit to/from stand Additional ADL Goal #1: Pt will sustain attention to familiar ADL tasks x 2 mins with min cues Additional ADL Goal #2: Pt will consistently follow one step commands with no more than min cues  OT Frequency: Min 3X/week   Barriers to D/C:            Co-evaluation PT/OT/SLP Co-Evaluation/Treatment: Yes Reason for Co-Treatment: To address functional/ADL transfers;For patient/therapist safety;Necessary to address cognition/behavior during functional activity   OT goals addressed during session: ADL's and self-care      AM-PAC OT "6 Clicks" Daily Activity     Outcome Measure Help from another person eating meals?: A Lot Help from another person taking care of personal grooming?: Total Help from another person toileting, which includes using toliet, bedpan, or urinal?: A Lot Help from another person bathing (including washing, rinsing, drying)?: Total Help from another person to put on and taking off regular upper body clothing?: Total Help from another person to put on and taking off regular lower body clothing?: Total 6 Click Score: 8   End of Session Equipment Utilized During Treatment: Gait belt;Rolling walker;Other (comment)(bil. LEs prostheses ) Nurse Communication: Mobility status  Activity Tolerance: Other (comment)(cognition ) Patient left: in bed;with call bell/phone within reach;with restraints reapplied;with bed alarm set  OT Visit Diagnosis: Unsteadiness on feet (R26.81);Cognitive communication deficit (R41.841) Symptoms and signs involving cognitive functions: Cerebral infarction                Time: 7741-2878 OT Time Calculation (min): 40 min Charges:  OT General  Charges $OT Visit: 1 Visit OT Evaluation $OT Eval Moderate Complexity: 1 Mod  Lucille Passy, OTR/L Acute Rehabilitation Services Pager [phone] Office [phone]   Lucille Passy M 10/30/2018, 3:43 PM

## 2018-10-30 NOTE — Progress Notes (Signed)
Patient pulled NGT out, despite wearing safety mittens. While replacing NGT, patient batting at nurses with hands and attempting to remove the new NGT. Notified Dr. Jimmy Footman and received orders for bilateral wrist restraints. Will monitor closely.

## 2018-10-30 NOTE — Progress Notes (Signed)
STROKE TEAM PROGRESS NOTE   INTERVAL HISTORY The patient's sugars are well controlled.  He is showing neurological improvement and is now able to speak a few words and short sentences.  He still confused and disoriented.  Is able to move all 4 extremities against gravity.  MRI scan of the brain showed no acute abnormality.  Mild age-appropriate changes of small vessel disease and cerebral atrophy.  No changes to suggest posterior reversible encephalopathy or seizures.  EEG showed diffuse mild slowing with some triphasic waves anteriorly but no definite seizure activity. Vitals:   10/30/18 0900 10/30/18 1000 10/30/18 1100 10/30/18 1200  BP: (!) 148/74 (!) 164/68 (!) 156/70 (!) 150/80  Pulse: 61 68 60 60  Resp: 10 13 12    Temp:    97.9 F (36.6 C)  TempSrc:    Axillary  SpO2: 99% 100% 100% 100%  Weight:        CBC:  Recent Labs  Lab 10/28/18 1327 10/29/18 0530  WBC 9.4 16.1*  NEUTROABS 7.1 13.8*  HGB 9.9* 9.5*  HCT 30.8* 29.8*  MCV 87.5 87.1  PLT 123* 140*    Basic Metabolic Panel:  Recent Labs  Lab 10/28/18 2142 10/29/18 0530  NA 134* 133*  K 3.1* 3.6  CL 105 103  CO2 17* 20*  GLUCOSE 182* 198*  BUN 34* 30*  CREATININE 3.15* 3.30*  CALCIUM 8.9 9.0  MG 1.1* 1.6*   Lipid Panel:     Component Value Date/Time   CHOL 108 10/29/2018 0530   TRIG 57 10/30/2018 0530   HDL 49 10/29/2018 0530   CHOLHDL 2.2 10/29/2018 0530   VLDL 11 10/29/2018 0530   LDLCALC 48 10/29/2018 0530   HgbA1c:  Lab Results  Component Value Date   HGBA1C 13.6 (H) 10/29/2018   Urine Drug Screen: No results found for: LABOPIA, COCAINSCRNUR, LABBENZ, AMPHETMU, THCU, LABBARB  Alcohol Level No results found for: [place] Head Code Stroke Wo Contrast 10/28/2018 1. No evidence of acute intracranial abnormality. 2. ASPECTS is 10. 3. Mild chronic small vessel ischemic disease and cerebral atrophy.   Cerebral Angio 10/28/2018 1.No angiographic evidence of  occlusions,stenosis,dissections,aneurysm,avm or A V shunting. 2.Venous outflow WNLs.  Ct Head Wo Contrast 10/29/2018 0351 Some intravascular contrast is present. Stable since yesterday and negative CT appearance of the brain.   Dg Chest Port 1 View 10/28/2018 No active disease.   Dg Abd 1 View 10/29/2018 Diffuse gaseous distention of bowel, favor ileus.   Mr Brain Wo Contrast 10/29/2018 No acute finding by MRI. Mild age related volume loss. Minimal T2 and FLAIR foci in the frontal white matter.   Dg Abd Portable 1v 10/30/2018 Gastric catheter in place. Bilateral renal calculi.  No definitive ureteral stones are seen.   Dg Abd Portable 1v 10/30/2018 Gastric catheter within the stomach.    EEG 1) asymmetric background with attenuation of faster frequencies on the left  2) generalized irregular slow activity 3) triphasic waves Clinical Interpretation: This EEG is suggestive of a left hemispheric cortical dysfunction in the setting of a more generalized nonspecific cerebral dysfunction (encephalopathy). There was no seizure or seizure predisposition recorded on this study.    PHYSICAL EXAM  Frail middle-aged Caucasian male who appears not to be in distress. . Afebrile. Head is nontraumatic. Neck is supple without bruit.    Cardiac exam no murmur or gallop. Lungs are clear to auscultation. Distal pulses are well felt except in the lower extremities..  Bilateral below-knee amputation Neurological Exam :  Patient is awake.  He is encephalopathic.  He can speak a few words and occasional short sentences.  There is verbal perseveration.  There is diminished attention.  He follows only occasional midline commands.   .  Unable to name repeat and comprehend well.  Eyes in primary position.  He will follow gaze in all directions.    Does not blink to threat on either side consistently.  Face appears symmetric.  Tongue midline.  Spontaneous extremity movements but more on the left right upper  extremity.  Check due to"   ASSESSMENT/PLAN Paul [name] is a 63 y.o. male with history of diabetes, bilateral above the knee amputations, hypertension, blood transfusion, end-stage renal disease s/p kidney transplant presenting with inability to speak, R hemianopsia, L gaze preference, R hand weakness. Received IV tPA 10/28/2018 at 1339.  Severe hyperglycemia presenting as Possible L MCA infarct s/p tPA Etiology of severe hyperglycemia unclear possibly contribution from Prograf toxicity.  Code Stroke CT head No acute stroke. Small vessel disease. Atrophy. ASPECTS 10.     Cerebral angio Unremarkable   Post IR CT CT head  CT head 6/11 0150 some intravascular contrast. Stable. Negative.   MRI no acute infarct  2D Echo Jan 2020 EF 50-55%. No source of embolus   Check EEG no seizure. L sided slowing  LDL 48  HgbA1c 13.6  Lovenox 30 mg sq daily for VTE prophylaxis  aspirin 81 mg daily prior to admission, now on No antithrombotic. Consider resuming aspirin  Therapy recommendations: Pending  Disposition: Pending  Atrial Fibrillation w/ RVR  Home anticoagulation:  none    Initial dx in 2014 while under multiple stressors, Coumadin stopped in February 2015 due to concerns for Coumadin necrosis.  No recurrence of A. fib when last seen by Dr. Martinique in January 2017   Rates 130-140s in hospital  On amio drip x 24h  Replace magnesium and potassium, now 1.6 and 3.6 respectively  Consider anticoagulation with DOACs at discharge   Hypertension  Home meds: Norvasc 10 . Stable  . BP goal normotensive  Hyperlipidemia  Home meds: Lipitor 10, resumed in hospital  LDL 48, goal < 70  Continue statin at discharge  Diabetes type I Uncontrolled Diabetic Neuropathy  Home meds: Insulin glargine 20u  HgbA1c 13.6, goal < 7.0  Glucose elevated on admission at 656   Treated with insulin gtt, now off  CBGs  SSI  ? Hyperglycemia r/t Prograf. Level pending    Colonic  Ileus  Seen on abd xray  Other Stroke Risk Factors  Advanced age  Family hx stroke (maternal grandmother)  PAD s/p B BKAs  Other Active Problems  CKD stage IV  s/p renal transplant on prograf. Cr 3.3  Anemia of chronic disease Hgb 9.5  Intermittent vomiting for several months, treated with Zofran  Increased lethargy and decreased p.o. intake over the past 2 weeks  Leukocytosis WBC 16.1 Hyponatremia NA 133 New recurrence of paroxysmal A. fib. Hospital day # 2  . [name] He presented clinically like a left MCA infarct with NIH stroke scale of 19 but with severe hyperglycemia.  He received TPA imaging studies did not shows acute stroke.  He continues to show improvement in exam  Expect full improvement improvement  over the next day or 2.  Continue   Insulin drip and euglycemia .  Aggressive hydration.  Check Prograf levels as tacrolimus toxicity has been known to cause encephalopathy and worsening hyperglycemia    discussed with patient's  wife over the phone and answered questions.  Discussed with Dr. Lynetta Mare.  Stroke team will sign off.  Kindly call for questions. This patient is critically ill and at significant risk of neurological worsening, death and care requires constant monitoring of vital signs, hemodynamics,respiratory and cardiac monitoring, extensive review of multiple databases, frequent neurological assessment, discussion with family, other specialists and medical decision making of high complexity.I have made any additions or clarifications directly to the above note.This critical care time does not reflect procedure time, or teaching time or supervisory time of PA/NP/Med Resident etc but could involve care discussion time.  I spent 30 minutes of neurocritical care time  in the care of  this patient.      Paul Contras, MD Medical Director [facility] Pager: [phone] 10/30/2018 12:18 PM   To contact Stroke Continuity provider, please refer to  [email or web address] After hours, contact General Neurology

## 2018-10-30 NOTE — Progress Notes (Signed)
Rehab Admissions Coordinator Note:  Patient was screened by Michel Santee for appropriateness for an Inpatient Acute Rehab Consult.  At this time, we are recommending Inpatient Rehab consult.  I will contact MD for order.   Michel Santee 10/30/2018, 4:18 PM  I can be reached at [phone].

## 2018-10-30 NOTE — Progress Notes (Signed)
OT Cancellation Note  Patient Details Name: LEVEON PELZER MRN: [id number] DOB: 1955/08/28   Cancelled Treatment:    Reason Eval/Treat Not Completed: Active bedrest order.  Will reattempt.  Lucille Passy, OTR/L Acute Rehabilitation Services Pager [phone] Office [phone]   Lucille Passy M 10/30/2018, 10:02 AM

## 2018-10-30 NOTE — Progress Notes (Signed)
Inpatient Diabetes Program Recommendations  AACE/ADA: New Consensus Statement on Inpatient Glycemic Control (2015)  Target Ranges:  Prepandial:   less than 140 mg/dL      Peak postprandial:   less than 180 mg/dL (1-2 hours)      Critically ill patients:  140 - 180 mg/dL   Lab Results  Component Value Date   GLUCAP 71 10/30/2018   HGBA1C 13.6 (H) 10/29/2018    Review of Glycemic Control Results for [name], [name] (MRN [id number]) as of 10/30/2018 12:54  Ref. Range 10/29/2018 23:27 10/30/2018 03:30 10/30/2018 08:16 10/30/2018 09:38 10/30/2018 11:47  Glucose-Capillary Latest Ref Range: 70 - 99 mg/dL 105 (H) 93 64 (L) 113 (H) 71  Diabetes history: Type 1 DM per H&P Outpatient Diabetes medications:  Basaglar 20 units daily Current orders for Inpatient glycemic control:  Levemir 10 units bid Novolog 1-3 units q 4 hours Inpatient Diabetes Program Recommendations:   Blood sugars < 100 mg/dL.  Please reduce Levemir to 7 units bid.  Will need to speak with patient when appropriate regarding A1C.  Thanks,   Adah Perl, RN, BC-ADM Inpatient Diabetes Coordinator Pager [phone] (8a-5p)

## 2018-10-30 NOTE — Progress Notes (Addendum)
NAME:  Paul [name], MRN:  [id number], DOB:  1955-10-24, LOS: 2 ADMISSION DATE:  10/28/2018, CONSULTATION DATE:  10/28/18 REFERRING MD:  Dr Estanislado Pandy, CHIEF COMPLAINT:  med management  Brief History   63 yo cm presented with L MCA syndrome. given IV TPA and taken to IR for angio without intervention needed. Found to be confused and hyperglycemic post-procedure.   History of present illness   63 yo with pmh IDDM, ESRD, BKA bilateral coming presented from home after last being seen normal on video call at 1100 with colleague when he developed trouble talking and could not move his R arm. Wife found pt at 54 in the closet acting confused and unable to speak she called EMS. Arrived to ed altered with R facial droop, r side flaccid with neglect. ultimately given tpa at 1339.  Initial NIHSS 18 c/w L MCA symptoms taken to IR without stenosis or occlusion found to intervene.  We were called for med management post operatively  After speaking with wife pt states that over the past 2 weeks he has been much more lethargic and lack of po intake. She is unsure if he has been taking his insulin at home as he has been no eating well. They have been a bit concerned about his general malaise over the past 2 weeks. Increased sleeping. They were going to make appt to see a dr soon due to this. She states he has been vomiting intermittently for months, no recent n/v/f/c. She states that he wakes up around 4 or 5 and goes to bed around 1300. She does not wake up until 10-11 regularly so she is unaware of much of his recent activities.   Past Medical History   Past Medical History:  Diagnosis Date  . A-fib ([place])   . Anemia    esrd  . Calciphylaxis 05/2013   lower ext  . Cancer ([place])    hx duodenal adenoCa 2002, resected  . Closed left arm fracture 1967  . Corneal abrasion, left   . Depression   . Diabetes mellitus    Type 1 iddm x 11 yrs  . Diabetic neuropathy ([place])   . ESRD on hemodialysis ([place])    Pt  has ESRD due to DM.  He had a L upper arm AVF prior to starting HD which was ligated due to L arm steal syndrome.  He started HD in Jan 2014 and did home HD.  The family couldn't cannulate the R arm AVF successfully so by mid 2014 they decided to switch to PD which was done late summer 2014.  In Jan 2015 he was admitted with FTT felt to be due to underdialysis on PD and PD was abandoned and he   . GERD (gastroesophageal reflux disease)   . Glomerulosclerosis, diabetic ([place])   . Heart murmur   . History of blood transfusion   . Hypertension    off of meds due to orthostatic hypotension  . Kidney transplant recipient   . Open wound of both legs with complication    Pt has had progressive wounds of both LE's including gangrene of the toes and patchy necrosis of the calves.  He has been treated at [facility] by Dr Jerline Pain with hyperbaric O2 5d per week.  He is getting Na Thiosulfate with HD for suspected calciphylaxis. Pt says doctor's aren't sure if these ulcers were diabetic ulcers or calciphylaxis.  He underwent bilat BKA on 07/30/13.  He says that the woun  Significant Hospital Events   6/10: L MCA syndrome  Consults:  6/10 IR 6/10 CCM  Procedures:  6/10:TPA given  Significant Diagnostic Tests:  6/10: cth: 1. No evidence of acute intracranial abnormality. 2. ASPECTS is 10. 3. Mild chronic small vessel ischemic disease and cerebral atrophy. 6/11: KUB (personally interpreted) diffuse colonic dilatation consistent with ileus. 6/11: MRI shows nil acute 6/12: improving gas pattern.  Micro Data:  6/10 covid: negative  Antimicrobials:  n/a  Interim history/subjective:  Continues to have garbled speech. Moves all extremities, follows commands intermittently.  Objective   Blood pressure (!) 164/68, pulse 68, temperature 98.9 F (37.2 C), resp. rate 13, weight 71.6 kg, SpO2 100 %.        Intake/Output Summary (Last 24 hours) at 10/30/2018 1021 Last data filed at 10/30/2018 1000  Gross per 24 hour  Intake 2945.5 ml  Output 2875 ml  Net 70.5 ml   Filed Weights   10/28/18 1300 10/28/18 1352  Weight: 71.6 kg 71.6 kg    Examination: General: no acute distress, awake but aphasic HEENT: dry mucous membranes. NGT in place draining gastric content.  Lungs: CTA, no wheezes, rhonchi, rales Cardiovascular: RRR, no m/g/r, in sinus rhythm. Abdomen: soft, NT,ND, BS rare. Extremities: + edema Skin: no rashes, warm and dry Neuro: Speech remains incomprehensible.  Able to keep arms and legs up antigravity bilaterally. 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  -s/p iv tpa -s/p ir agio without need for intervention -cont neuro exams -Maintain euglycemia -Neurology has signed off as no CVA or seizure.  S/p DDKT with chronic AbMR and IFTA, now with CKD IV:  P: -meeting with [facility] in July to restart process for 2nd transplant -not on dialysis and NOT anuric -may warrant renal consult in am -cont hydration, bolus for low urine output. -on chronic immunosuppression/steroids/prograf/cellcept -Tacrolimus levels pending - may be supratherapeutic and contributing to current presentation.   DM1 with hyperglycemia -Hyperglycemic hyperosmolar state without ketosis.  Now resolved P:  Transitioned  to subcutaneous insulin  N/V-unexplained but likely precipitant for hyperglycemia and stroke syndrome. Colonic ileus seen on x-ray may be cause or result of hyperglycemia.  May be related to diabetic gastroparesis. Improving gas pattern. P:  -Continue scheduled metoclopramide. -Follow electrolytes. -Maintaining euvolemia. -NGT to gravity. If no emesis for 6h, can start enteral medications.  -Colonic dysmotility may improve with better glycemic control -At risk for aspiration.   Atrial fibrillation with rapid ventricular response.  Now in sinus rhythm  -Complete 24-hour amiodarone infusion protocol.  Best practice:  Diet: npo  Pain/Anxiety/Delirium protocol (if indicated): per protocol VAP protocol (if indicated): per protocol DVT prophylaxis: per neuro GI prophylaxis:  Glucose control: insulin infusion Mobility: bed Code Status: full Family Communication: wife updated by phone 6/11. Disposition: icu  Labs   CBC: Recent Labs  Lab 10/28/18 1327 10/29/18 0530  WBC 9.4 16.1*  NEUTROABS 7.1 13.8*  HGB 9.9* 9.5*  HCT 30.8* 29.8*  MCV 87.5 87.1  PLT 123* 140*    Basic Metabolic Panel: Recent Labs  Lab 10/28/18 1327 10/28/18 1637 10/28/18 2142 10/29/18 0530  NA 128*  --  134* 133*  K 4.2  --  3.1* 3.6  CL 96*  --  105 103  CO2 20*  --  17* 20*  GLUCOSE 790* 656* 182* 198*  BUN 32*  --  34* 30*  CREATININE 3.31*  --  3.15* 3.30*  CALCIUM 9.7  --  8.9 9.0  MG  --   --  1.1* 1.6*   GFR: Estimated Creatinine Clearance: 22.6 mL/min (A) (by C-G formula based on SCr of 3.3 mg/dL (H)). Recent Labs  Lab 10/28/18 1327 10/29/18 0530  WBC 9.4 16.1*    Liver Function Tests: Recent Labs  Lab 10/28/18 1327  AST 11*  ALT 12  ALKPHOS 57  BILITOT 0.9  PROT 5.3*  ALBUMIN 3.3*   Recent Labs  Lab 10/28/18 1830  LIPASE 23  AMYLASE 28   No results for input(s): AMMONIA in the last 168 hours.  ABG    Component Value Date/Time   TCO2 31 06/14/2013 0627     Coagulation Profile: Recent Labs  Lab 10/28/18 1327  INR 1.2    Cardiac Enzymes: No results for input(s): CKTOTAL, CKMB, CKMBINDEX, TROPONINI in the last 168 hours.  HbA1C: Hgb A1c MFr Bld  Date/Time Value Ref Range Status  10/29/2018 05:29 AM 13.6 (H) 4.8 - 5.6 % Final    Comment:    (NOTE) Pre diabetes:          5.7%-6.4% Diabetes:              >6.4% Glycemic control for   <7.0% adults with diabetes   05/24/2018 01:00 AM 7.9 (H) 4.8 - 5.6 % Final    Comment:    (NOTE) Pre diabetes:          5.7%-6.4% Diabetes:              >6.4% Glycemic  control for   <7.0% adults with diabetes     CBG: Recent Labs  Lab 10/29/18 1953 10/29/18 2327 10/30/18 0330 10/30/18 0816 10/30/18 [place] 99 105* 93 64* [place], MD [facility] ICU Physician [place]  Pager: [phone] Mobile: [phone] After hours: [phone].  10/30/2018, 10:21 AM

## 2018-10-30 NOTE — Evaluation (Signed)
Physical Therapy Evaluation Patient Details Name: Paul [name] MRN: [id number] DOB: 08/24/1955 Today's Date: 10/30/2018   History of Present Illness  This 63 y.o. male admitted with AMS,  aphasia, Rt sided weakness, and Lt gaze preference.   CT of head showed no acute abnormality.  tPA was administered.   Pt was prepped for thrombectomy due to suspected MCA occlusion, but no occlusion was found.  MRI showed no acute abnormality.  Blood glucose was 790 on admission.  PMH includes:  IDDM, Bil. BKA (with prostheses), A-Fib, h/o duodenal adenocarcinoma s/p whipple procedure, Diabetic neuropathy, ESRD, s/p renal transplant, HTN   Clinical Impression  Pt admitted with above diagnosis. Pt currently with functional limitations due to the deficits listed below (see PT Problem List). Prior to admission, pt was working as an Chief Financial Officer, uses Programmer, systems and cane for community ambulation. Now presenting with significantly decreased functional mobility secondary to decreased cognition, weakness, and balance impairments. Pt has deficits in attention, problem solving, and following commands 35-40% of the time. Pt requiring two person moderate assist for transfers and able to take a couple steps with a walker and prosthetics donned. Highly recommending CIR to maximize functional independence and suspect pt will progress well based on age, motivation, PLOF, and excellent family support.      Follow Up Recommendations CIR;Supervision/Assistance - 24 hour    Equipment Recommendations  None recommended by PT    Recommendations for Other Services Rehab consult     Precautions / Restrictions Precautions Precautions: Fall Restrictions Weight Bearing Restrictions: No      Mobility  Bed Mobility Overal bed mobility: Needs Assistance Bed Mobility: Supine to Sit;Sit to Supine     Supine to sit: Max assist;+2 for safety/equipment Sit to supine: Mod assist   General bed mobility comments: Pt required  max A to initiate activity, and was unable to problem solve or motor plan how to move to EOB. Required assist to scoot LEs to EOB and to lift trunk   Transfers Overall transfer level: Needs assistance Equipment used: Rolling walker (2 wheeled) Transfers: Sit to/from [facility] Sit to Stand: Mod assist;+2 physical assistance;+2 safety/equipment;From elevated surface Stand pivot transfers: Min assist;+2 physical assistance;+2 safety/equipment       General transfer comment: Pt requires bed height to be elevated and assist to power up into standing.  Assist for balance. Able to take 1-2 steps forward and backwards. Attempting to sit before in front of bed.   Ambulation/Gait                Stairs            Wheelchair Mobility    Modified Rankin (Stroke Patients Only)       Balance Overall balance assessment: Needs assistance Sitting-balance support: Feet unsupported;Single extremity supported Sitting balance-Leahy Scale: Fair Sitting balance - Comments: able to maintain static sitting with close min guard assist    Standing balance support: Bilateral upper extremity supported Standing balance-Leahy Scale: Poor Standing balance comment: requires bil. UE support and min A                              Pertinent Vitals/Pain Pain Assessment: Faces Faces Pain Scale: No hurt    Home Living Family/patient expects to be discharged to:: Private residence Living Arrangements: Spouse/significant other Available Help at Discharge: Family Type of Home: Apartment Home Access: Ramped entrance     Home Layout: Able to live  on main level with bedroom/bathroom Home Equipment: Cane - single point;Shower seat;Wheelchair - manual;Crutches      Prior Function Level of Independence: Independent with assistive device(s)         Comments: Intermittently uses cane for outdoor ambulation. 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     Hand  Dominance   Dominant Hand: Right    Extremity/Trunk Assessment   Upper Extremity Assessment Upper Extremity Assessment: LUE deficits/detail RUE Deficits / Details: Pt demonstrates AROM grossly WFL.  He is unable to participate in formal assessment.  He appears to have Rt perceptual deficits and missed the RW several times when attempting to reach for it - could also be secondary to sensory deficit  RUE Coordination: decreased gross motor;decreased fine motor LUE Deficits / Details: Pt able to touch top of his head on command, he will start to lift his UE up over his head, but becomes distracted and unable to complete - unsure if this could be compounded by weakness as he is unable to participate in formal assessment     Lower Extremity Assessment Lower Extremity Assessment: RLE deficits/detail;LLE deficits/detail RLE Deficits / Details: BKA LLE Deficits / Details: BKA    Cervical / Trunk Assessment Cervical / Trunk Assessment: Normal  Communication   Communication: Receptive difficulties;Expressive difficulties  Cognition Arousal/Alertness: Awake/alert Behavior During Therapy: Flat affect;Impulsive Overall Cognitive Status: Impaired/Different from baseline Area of Impairment: Attention;Following commands                   Current Attention Level: Focused   Following Commands: Follows one step commands inconsistently;Follows one step commands with increased time       General Comments: Pt is able to state his name with moderate prompting.  He was able to recognize OP PT who is very familiar to him.  He follows one step commands ~35-40% of time with a delay and instruction repeated.  He demonstrates focused attention up to 7-8 seconds.       General Comments      Exercises     Assessment/Plan    PT Assessment Patient needs continued PT services  PT Problem List Decreased strength;Decreased activity tolerance;Decreased balance;Decreased mobility;Decreased  coordination;Decreased cognition;Decreased safety awareness       PT Treatment Interventions DME instruction;Gait training;Functional mobility training;Therapeutic activities;Therapeutic exercise;Balance training;Neuromuscular re-education;Patient/family education;Cognitive remediation    PT Goals (Current goals can be found in the Care Plan section)  Acute Rehab PT Goals Patient Stated Goal: none stated; pt wife would like him to go to CIR PT Goal Formulation: With patient Time For Goal Achievement: 11/13/18 Potential to Achieve Goals: Good    Frequency Min 3X/week   Barriers to discharge        Co-evaluation PT/OT/SLP Co-Evaluation/Treatment: Yes Reason for Co-Treatment: Complexity of the patient's impairments (multi-system involvement);Necessary to address cognition/behavior during functional activity;For patient/therapist safety;To address functional/ADL transfers PT goals addressed during session: Mobility/safety with mobility OT goals addressed during session: ADL's and self-care       AM-PAC PT "6 Clicks" Mobility  Outcome Measure Help needed turning from your back to your side while in a flat bed without using bedrails?: A Little Help needed moving from lying on your back to sitting on the side of a flat bed without using bedrails?: Total Help needed moving to and from a bed to a chair (including a wheelchair)?: A Lot Help needed standing up from a chair using your arms (e.g., wheelchair or bedside chair)?: A Lot  Help needed to walk in hospital room?: A Lot Help needed climbing 3-5 steps with a railing? : Total 6 Click Score: 11    End of Session Equipment Utilized During Treatment: Gait belt;Other (comment)(bilateral prosthetics) Activity Tolerance: Patient tolerated treatment well Patient left: in bed;with call bell/phone within reach;with bed alarm set;with restraints reapplied Nurse Communication: Mobility status PT Visit Diagnosis: Difficulty in walking, not  elsewhere classified (R26.2);Unsteadiness on feet (R26.81)    Time: 9038-3338 PT Time Calculation (min) (ACUTE ONLY): 41 min   Charges:   PT Evaluation $PT Eval Moderate Complexity: 1 Mod PT Treatments $Therapeutic Activity: 8-22 mins        Ellamae Sia, PT, DPT Acute Rehabilitation Services Pager [phone] Office [phone]   Willy Eddy 10/30/2018, 4:01 PM

## 2018-10-30 NOTE — Progress Notes (Signed)
Referring Physician(s): Code Stroke- Leonie Man, Pramod S  Supervising Physician: Luanne Bras  Patient Status:  [facility] - In-pt  Chief Complaint: None  Subjective:  Acute CVA (left MCA acute infarct) s/p diagnostic cerebral arteriogram (findings revealed no evidence of occlusions, stenosis, dissections, aneurysms, AVM, or AV shunting) 10/28/2018 by Dr. Estanislado Pandy. Patient laying in bed resting but arousable by voice. Can spontaneously move all extremities. Does not follow commands. Right groin incision c/d/i.   Allergies: Metformin and related  Medications: Prior to Admission medications   Medication Sig Start Date End Date Taking? Authorizing Provider  amLODipine (NORVASC) 10 MG tablet Take 10 mg by mouth daily.    [provider]  aspirin EC 81 MG tablet Take 81 mg by mouth daily.    [provider]  atorvastatin (LIPITOR) 10 MG tablet Take 1 tablet (10 mg total) by mouth daily. Patient not taking: Reported on 10/28/2018 08/13/13   Angiulli, Lavon Paganini, PA-C  calcitRIOL (ROCALTROL) 0.25 MCG capsule Take 0.5 mcg by mouth daily.    [provider]  calcium acetate (PHOSLO) 667 MG capsule Take 1,334 mg by mouth 3 (three) times daily with meals.    [provider]  CVS D3 50 MCG (2000 UT) CAPS Take 4,000 Units by mouth daily. 09/18/18   [provider]  Insulin Glargine (BASAGLAR KWIKPEN) 100 UNIT/ML SOPN Inject 0.2 mLs (20 Units total) into the skin daily. 05/25/18   Hosie Poisson, MD  ipratropium-albuterol (DUONEB) 0.5-2.5 (3) MG/3ML SOLN Take 3 mLs by nebulization every 6 (six) hours as needed. Patient not taking: Reported on 10/28/2018 05/25/18   Hosie Poisson, MD  magnesium chloride (SLOW-MAG) 64 MG TBEC SR tablet Take 2 tablets by mouth 2 (two) times daily. TAKE AT LEAST 2 HOURS SEPARATE FROM PROGRAF/TACROLIMUS 10/02/14   [provider]  mycophenolate (MYFORTIC) 180 MG EC tablet Take 360 mg by mouth 2 (two) times daily.     [provider]  omeprazole (PRILOSEC) 40 MG capsule Take 40 mg by mouth daily.  01/11/14   [provider]  predniSONE (DELTASONE) 5 MG tablet Take 5 mg by mouth daily with breakfast.     [provider]  sodium bicarbonate 650 MG tablet Take 1,950 mg by mouth 4 (four) times daily. 09/21/18   [provider]  tacrolimus (PROGRAF) 1 MG capsule Take 3-4 mg by mouth See admin instructions. Take 4 mg by mouth in the morning at 9 AM and 3 mg in the evening at 9 PM    [provider]  tamsulosin (FLOMAX) 0.4 MG CAPS capsule Take 0.4 mg by mouth daily after breakfast.     [provider]     Vital Signs: BP (!) 148/74   Pulse 61   Temp 98.9 F (37.2 C)   Resp 10   Wt 157 lb 13.6 oz (71.6 kg)   SpO2 99%   BMI (P) 20.83 kg/m   Physical Exam Vitals signs and nursing note reviewed.  Constitutional:      General: He is not in acute distress.    Appearance: Normal appearance.  Pulmonary:     Effort: Pulmonary effort is normal. No respiratory distress.  Skin:    General: Skin is warm and dry.     Comments: Right groin incision soft without active bleeding or hematoma.  Neurological:     Comments: Resting but arousable by voice. He does not follow commands. PERRL bilaterally. Can spontaneously move all extremities. Distal pulses 2+ bilaterally.  Imaging: Dg Abd 1 View  Result Date: 10/29/2018 CLINICAL DATA:  Vomiting EXAM: ABDOMEN - 1 VIEW COMPARISON:  None. FINDINGS: Diffuse gaseous distention of bowel, favor ileus. No organomegaly or free air. Vascular calcifications and surgical clips in the abdomen and pelvis. IMPRESSION: Diffuse gaseous distention of bowel, favor ileus. Electronically Signed   By: Rolm Baptise M.D.   On: 10/29/2018 08:59   Ct Head Wo Contrast  Result Date: 10/29/2018 CLINICAL DATA:  63 year old patient with code stroke presentation status post tPA. EXAM: UNSPECIFIED TECHNIQUE: Contiguous axial images were obtained from  the base of the skull through the vertex without intravenous contrast. COMPARISON:  10/28/2018 head CT. FINDINGS: Brain: There is intravascular contrast present. No intracranial mass effect. No ventriculomegaly. No acute intracranial hemorrhage identified. Gray-white matter differentiation throughout the brain appears stable. No acute or evolving infarct identified. No abnormal enhancement identified. Vascular: Calcified atherosclerosis at the skull base. The major intracranial vascular structures seem to be normally enhancing. Skull: Stable. Sinuses/Orbits: Trace paranasal sinus mucosal thickening. Tympanic cavities and mastoids remain clear. Other: No acute orbit or scalp soft tissue findings. IMPRESSION: Some intravascular contrast is present. Stable since yesterday and negative CT appearance of the brain. Electronically Signed   By: Genevie Ann M.D.   On: 10/29/2018 03:51   Mr Brain Wo Contrast  Result Date: 10/29/2018 CLINICAL DATA:  Code stroke treated with tPA. EXAM: MRI HEAD WITHOUT CONTRAST TECHNIQUE: Multiplanar, multiecho pulse sequences of the brain and surrounding structures were obtained without intravenous contrast. COMPARISON:  CT studies 10/28/2018 and same day. FINDINGS: Brain: Diffusion imaging does not show any acute or subacute infarction. Brainstem and cerebellum are normal. Cerebral hemispheres show mild age related volume loss but no evidence of focal infarction. Minimal T2 and FLAIR foci in the frontal white matter, often seen at this age even in healthy persons. No mass lesion, hemorrhage, hydrocephalus or extra-axial collection. Vascular: Major vessels at the base of the brain show flow. Skull and upper cervical spine: Negative Sinuses/Orbits: Small amount of fluid layering dependently in the right maxillary sinus and in the sphenoid sinus. Other: None IMPRESSION: No acute finding by MRI. Mild age related volume loss. Minimal T2 and FLAIR foci in the frontal white matter. Electronically  Signed   By: Nelson Chimes M.D.   On: 10/29/2018 14:35   Dg Chest Port 1 View  Result Date: 10/28/2018 CLINICAL DATA:  Follow-up intubation EXAM: PORTABLE CHEST 1 VIEW COMPARISON:  05/23/2018 FINDINGS: Heart and mediastinal shadows are normal. Aortic atherosclerosis. The lungs are clear. No effusions. No bone abnormality. IMPRESSION: No active disease. Electronically Signed   By: Nelson Chimes M.D.   On: 10/28/2018 17:03   Dg Abd Portable 1v  Result Date: 10/30/2018 CLINICAL DATA:  Check gastric catheter placement EXAM: PORTABLE ABDOMEN - 1 VIEW COMPARISON:  10/29/2018 FINDINGS: Gastric catheter is now noted within the stomach. Scattered large and small bowel gas is noted. Bilateral renal calculi are noted somewhat better visualized on today's exam. IMPRESSION: Gastric catheter within the stomach. Electronically Signed   By: Inez Catalina M.D.   On: 10/30/2018 08:10   Ct Head Code Stroke Wo Contrast  Result Date: 10/28/2018 CLINICAL DATA:  Code stroke.  Aphasia.  Right-sided weakness. EXAM: CT HEAD WITHOUT CONTRAST TECHNIQUE: Contiguous axial images were obtained from the base of the skull through the vertex without intravenous contrast. COMPARISON:  05/24/2013 FINDINGS: Brain: There is no evidence of acute infarct, intracranial hemorrhage, mass, midline shift, or extra-axial fluid collection. There is mild  cerebral atrophy. Cerebral white matter hypodensities are nonspecific but compatible with mild chronic small vessel ischemic disease. Vascular: Calcified atherosclerosis at the skull base. No hyperdense vessel. Skull: No fracture or suspicious osseous lesion. Unchanged subcentimeter sclerotic focus in the right mandibular condyle, possibly a bone island. Sinuses/Orbits: Visualized paranasal sinuses and mastoid air cells are clear. Bilateral cataract extraction is noted. Other: None. ASPECTS [facility] Stroke Program Early CT Score) - Ganglionic level infarction (caudate, lentiform nuclei, internal capsule,  insula, M1-M3 cortex): 7 - Supraganglionic infarction (M4-M6 cortex): 3 Total score (0-10 with 10 being normal): 10 IMPRESSION: 1. No evidence of acute intracranial abnormality. 2. ASPECTS is 10. 3. Mild chronic small vessel ischemic disease and cerebral atrophy. These results were communicated to Dr. Leonie Man at 1:25 pm on 10/28/2018 by text page via the [facility] messaging system. Electronically Signed   By: Logan Bores M.D.   On: 10/28/2018 13:25    Labs:  CBC: Recent Labs    05/23/18 1517 05/24/18 0544  09/16/18 1237 10/14/18 1250 10/28/18 1327 10/29/18 0530  WBC 13.5* 10.6*  --   --   --  9.4 16.1*  HGB 10.6* 9.1*   < > 8.6* 9.3* 9.9* 9.5*  HCT 34.0* 27.9*  --   --   --  30.8* 29.8*  PLT 232 182  --   --   --  123* 140*   < > = values in this interval not displayed.    COAGS: Recent Labs    10/28/18 1327  INR 1.2  APTT 25    BMP: Recent Labs    05/25/18 1152 10/28/18 1327 10/28/18 1637 10/28/18 2142 10/29/18 0530  NA 138 128*  --  134* 133*  K 3.8 4.2  --  3.1* 3.6  CL 112* 96*  --  105 103  CO2 15* 20*  --  17* 20*  GLUCOSE 206* 790* 656* 182* 198*  BUN 71* 32*  --  34* 30*  CALCIUM 7.3* 9.7  --  8.9 9.0  CREATININE 4.15* 3.31*  --  3.15* 3.30*  GFRNONAA 14* 18*  --  20* 19*  GFRAA 16* 21*  --  23* 22*    LIVER FUNCTION TESTS: Recent Labs    05/24/18 0331 10/28/18 1327  BILITOT 0.2* 0.9  AST 12* 11*  ALT 10 12  ALKPHOS 71 57  PROT 5.9* 5.3*  ALBUMIN 3.0* 3.3*    Assessment and Plan:  Acute CVA (left MCA acute infarct) s/p diagnostic cerebral arteriogram (findings revealed no evidence of occlusions, stenosis, dissections, aneurysms, AVM, or AV shunting) 10/28/2018 by Dr. Estanislado Pandy. Patient's condition stable- can move all extremities, he does not follow commands. Right groin incision stable. Further plans per neurology- appreciate and agree with management. Please call NIR with questions/concerns.   Electronically Signed: Earley Abide, PA-C  10/30/2018, 9:30 AM   I spent a total of 25 Minutes at the the patient's bedside AND on the patient's hospital floor or unit, greater than 50% of which was counseling/coordinating care for acute CVA s/p diagnostic cerebral arteriogram.

## 2018-10-31 ENCOUNTER — Encounter (HOSPITAL_COMMUNITY): Payer: Self-pay | Admitting: Nephrology

## 2018-10-31 DIAGNOSIS — E1022 Type 1 diabetes mellitus with diabetic chronic kidney disease: Secondary | ICD-10-CM

## 2018-10-31 DIAGNOSIS — N184 Chronic kidney disease, stage 4 (severe): Secondary | ICD-10-CM

## 2018-10-31 DIAGNOSIS — I6300 Cerebral infarction due to thrombosis of unspecified precerebral artery: Secondary | ICD-10-CM

## 2018-10-31 DIAGNOSIS — I129 Hypertensive chronic kidney disease with stage 1 through stage 4 chronic kidney disease, or unspecified chronic kidney disease: Secondary | ICD-10-CM

## 2018-10-31 DIAGNOSIS — I6602 Occlusion and stenosis of left middle cerebral artery: Secondary | ICD-10-CM

## 2018-10-31 LAB — GLUCOSE, CAPILLARY
Glucose-Capillary: 106 mg/dL — ABNORMAL HIGH (ref 70–99)
Glucose-Capillary: 87 mg/dL (ref 70–99)
Glucose-Capillary: 65 mg/dL — ABNORMAL LOW (ref 70–99)
Glucose-Capillary: 126 mg/dL — ABNORMAL HIGH (ref 70–99)
Glucose-Capillary: 146 mg/dL — ABNORMAL HIGH (ref 70–99)
Glucose-Capillary: 135 mg/dL — ABNORMAL HIGH (ref 70–99)

## 2018-10-31 LAB — TACROLIMUS LEVEL: Tacrolimus (FK506) - LabCorp: 5.6 ng/mL (ref 2.0–20.0)

## 2018-10-31 LAB — BASIC METABOLIC PANEL
Anion gap: 8 (ref 5–15)
BUN: 28 mg/dL — ABNORMAL HIGH (ref 8–23)
CO2: 21 mmol/L — ABNORMAL LOW (ref 22–32)
Calcium: 7.8 mg/dL — ABNORMAL LOW (ref 8.9–10.3)
Chloride: 111 mmol/L (ref 98–111)
Creatinine, Ser: 3.79 mg/dL — ABNORMAL HIGH (ref 0.61–1.24)
GFR calc Af Amer: 18 mL/min — ABNORMAL LOW (ref >60)
GFR calc non Af Amer: 16 mL/min — ABNORMAL LOW (ref >60)
Glucose, Bld: 112 mg/dL — ABNORMAL HIGH (ref 70–99)
Potassium: 3.5 mmol/L (ref 3.5–5.1)
Sodium: 140 mmol/L (ref 135–145)

## 2018-10-31 LAB — MAGNESIUM: Magnesium: 2.3 mg/dL (ref 1.7–2.4)

## 2018-10-31 MED ORDER — ASPIRIN EC 81 MG PO TBEC
81.0000 mg | DELAYED_RELEASE_TABLET | Freq: Every day | ORAL | Status: DC
  Administered 2018-10-31 – 2018-11-04 (×5): 81 mg via ORAL
  Filled 2018-10-31 (×5): qty 1

## 2018-10-31 MED ORDER — CHLORHEXIDINE GLUCONATE CLOTH 2 % EX PADS: 6.0000 | MEDICATED_PAD | Freq: Every day | CUTANEOUS | Status: DC

## 2018-10-31 MED ORDER — PREDNISONE 5 MG PO TABS
5.0000 mg | ORAL_TABLET | Freq: Every day | ORAL | Status: DC
  Administered 2018-10-31 – 2018-11-04 (×5): 5 mg via ORAL
  Filled 2018-10-31 (×5): qty 1

## 2018-10-31 MED ORDER — MYCOPHENOLATE SODIUM 180 MG PO TBEC
360.0000 mg | DELAYED_RELEASE_TABLET | Freq: Two times a day (BID) | ORAL | Status: DC
  Administered 2018-10-31 – 2018-11-04 (×9): 360 mg via ORAL
  Filled 2018-10-31 (×11): qty 2

## 2018-10-31 NOTE — Consult Note (Signed)
Renal Service Consult Note [facility]  Paul [name] 10/31/2018 [place] Requesting Physician:  Dr Lake Bells  Reason for Consult:  Transplant, renal failure HPI: The patient is a 63 y.o. year-old with hx of HTN, DM2, depression, calciphylaxis, anemia, atrial fib, bilat BKA and ESRD admitted for altered mental status/ speech problems acute onset. Acute CVA suspected, received TPA.  CT / MRI brain showed no CVA however. BS 's were high, now corrected. Still having memory and speech issues, possibly a little better. Neuro following.  Tac level 5.6 (3.0- 8.0). Creat 3.79. Pt got not get IV contrast w/ scans. Pt had fever spike 2nd day here to 102.2, blood cx's neg, since then temps have reduced.  Asked to see for CKD/ transplant.    Spoke w/ wife, pt unable to answer any details about medical hx.    Pt started HD in 2014, failed home HD due to failure to cannulate, did PD < 1 yr and became uremic, switched to in-center HD until renal Tx 3-4 yrs ago. Now is f/b Dr Lorrene Reid, his renal function is poor at "20%" and he is supposed to go to [facility] to get back on the Tx list.  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  Works mostly from home after bilat BKA.     ROS = n/a  Past Medical History  Past Medical History:  Diagnosis Date  . A-fib ([place])   . Anemia    esrd  . Calciphylaxis 05/2013   lower ext  . Cancer ([place])    hx duodenal adenoCa 2002, resected  . Closed left arm fracture 1967  . Corneal abrasion, left   . Depression   . Diabetes mellitus    Type 1 iddm x 11 yrs  . Diabetic neuropathy ([place])   . ESRD on hemodialysis ([place])    Pt has ESRD due to DM.  He had a L upper arm AVF prior to starting HD which was ligated due to L arm steal syndrome.  He started HD in Jan 2014 and did home HD.  The family couldn't cannulate the R arm AVF successfully so by mid 2014 they decided to switch to PD which was done late  summer 2014.  In Jan 2015 he was admitted with FTT felt to be due to underdialysis on PD and PD was abandoned and he   . GERD (gastroesophageal reflux disease)   . Glomerulosclerosis, diabetic ([place])   . Heart murmur   . History of blood transfusion   . Hypertension    off of meds due to orthostatic hypotension  . Kidney transplant recipient   . Open wound of both legs with complication    Pt has had progressive wounds of both LE's including gangrene of the toes and patchy necrosis of the calves.  He has been treated at [facility] by Dr Jerline Pain with hyperbaric O2 5d per week.  He is getting Na Thiosulfate with HD for suspected calciphylaxis. Pt says doctor's aren't sure if these ulcers were diabetic ulcers or calciphylaxis.  He underwent bilat BKA on 07/30/13.  He says that the woun   Past Surgical History  Past Surgical History:  Procedure Laterality Date  . AMPUTATION Bilateral 07/30/2013   Procedure: AMPUTATION BELOW KNEE;  Surgeon: Newt Minion, MD;  Location: [place];  Service: Orthopedics;  Laterality: Bilateral;  Bilateral Below Knee Amputations  . AMPUTATION Bilateral 09/03/2013  Procedure: AMPUTATION BELOW KNEE;  Surgeon: Newt Minion, MD;  Location: [place];  Service: Orthopedics;  Laterality: Bilateral;  Bilateral Below Knee Amputation Revision  . AV FISTULA PLACEMENT  06/07/2011   Procedure: ARTERIOVENOUS (AV) FISTULA CREATION;  Surgeon: Angelia Mould, MD;  Location: [place];  Service: Vascular;  Laterality: Left;  . AV FISTULA PLACEMENT  06/18/2012   Procedure: ARTERIOVENOUS (AV) FISTULA CREATION;  Surgeon: Mal Misty, MD;  Location: [place];  Service: Vascular;  Laterality: Right;  [place]  . BILIARY DIVERSION, EXTERNAL  2002  . BONE MARROW BIOPSY  2012  . CAPD INSERTION N/A 01/14/2013   Procedure: LAPAROSCOPIC INSERTION CONTINUOUS AMBULATORY PERITONEAL DIALYSIS  (CAPD) CATHETER;  Surgeon: Adin Hector, MD;  Location: [place];  Service: General;  Laterality: N/A;  . CAPD  REMOVAL N/A 07/09/2013   Procedure: CONTINUOUS AMBULATORY PERITONEAL DIALYSIS  (CAPD) CATHETER REMOVAL;  Surgeon: Adin Hector, MD;  Location: [place];  Service: General;  Laterality: N/A;  . COLONOSCOPY     Hx: of  . INSERTION OF DIALYSIS CATHETER  05/22/2012   Procedure: INSERTION OF DIALYSIS CATHETER;  Surgeon: Mal Misty, MD;  Location: [place];  Service: Vascular;  Laterality: N/A;  right internal jugular vein  . LIGATION OF ARTERIOVENOUS  FISTULA  05/22/2012   Procedure: LIGATION OF ARTERIOVENOUS  FISTULA;  Surgeon: Mal Misty, MD;  Location: [place];  Service: Vascular;  Laterality: Left;  Left brachio-cephalic fistula  . LOWER EXTREMITY ANGIOGRAM N/A 06/14/2013   Procedure: LOWER EXTREMITY ANGIOGRAM;  Surgeon: Angelia Mould, MD;  Location: [facility] CATH LAB;  Service: Cardiovascular;  Laterality: N/A;  . NEPHRECTOMY TRANSPLANTED ORGAN    . OTHER SURGICAL HISTORY     Cyst removed from back  . PORTACATH PLACEMENT  2002  . RADIOLOGY WITH ANESTHESIA N/A 10/28/2018   Procedure: IR WITH ANESTHESIA;  Surgeon: Radiologist, Medication, MD;  Location: [place];  Service: Radiology;  Laterality: N/A;  . REMOVAL OF A DIALYSIS CATHETER    . RENAL BIOPSY, PERCUTANEOUS  2012  . TONSILLECTOMY    . VASCULAR SURGERY    . WHIPPLE PROCEDURE  2002   duodenal ca, Dr. Harlow Asa   Family History  Family History  Problem Relation Age of Onset  . Cancer Mother   . Cancer Father        prostate cancer  . Heart disease Maternal Grandfather   . Stroke Paternal Grandmother    Social History  reports that he has never smoked. He has never used smokeless tobacco. He reports that he does not drink alcohol or use drugs. Allergies  Allergies  Allergen Reactions  . Metformin And Related Other (See Comments)    Has kidney problems   Home medications Prior to Admission medications   Medication Sig Start Date End Date Taking? Authorizing Provider  amLODipine (NORVASC) 10 MG tablet Take 10 mg by mouth daily.     [provider]  aspirin EC 81 MG tablet Take 81 mg by mouth daily.    [provider]  atorvastatin (LIPITOR) 10 MG tablet Take 1 tablet (10 mg total) by mouth daily. Patient not taking: Reported on 10/28/2018 08/13/13   Angiulli, Lavon Paganini, PA-C  calcitRIOL (ROCALTROL) 0.25 MCG capsule Take 0.5 mcg by mouth daily.    [provider]  calcium acetate (PHOSLO) 667 MG capsule Take 1,334 mg by mouth 3 (three) times daily with meals.    [provider]  CVS D3 50 MCG (2000 UT)  CAPS Take 4,000 Units by mouth daily. 09/18/18   [provider]  Insulin Glargine (BASAGLAR KWIKPEN) 100 UNIT/ML SOPN Inject 0.2 mLs (20 Units total) into the skin daily. 05/25/18   Hosie Poisson, MD  ipratropium-albuterol (DUONEB) 0.5-2.5 (3) MG/3ML SOLN Take 3 mLs by nebulization every 6 (six) hours as needed. Patient not taking: Reported on 10/28/2018 05/25/18   Hosie Poisson, MD  magnesium chloride (SLOW-MAG) 64 MG TBEC SR tablet Take 2 tablets by mouth 2 (two) times daily. TAKE AT LEAST 2 HOURS SEPARATE FROM PROGRAF/TACROLIMUS 10/02/14   [provider]  mycophenolate (MYFORTIC) 180 MG EC tablet Take 360 mg by mouth 2 (two) times daily.     [provider]  omeprazole (PRILOSEC) 40 MG capsule Take 40 mg by mouth daily.  01/11/14   [provider]  predniSONE (DELTASONE) 5 MG tablet Take 5 mg by mouth daily with breakfast.     [provider]  sodium bicarbonate 650 MG tablet Take 1,950 mg by mouth 4 (four) times daily. 09/21/18   [provider]  tacrolimus (PROGRAF) 1 MG capsule Take 3-4 mg by mouth See admin instructions. Take 4 mg by mouth in the morning at 9 AM and 3 mg in the evening at 9 PM    [provider]  tamsulosin (FLOMAX) 0.4 MG CAPS capsule Take 0.4 mg by mouth daily after breakfast.     [provider]   Liver Function Tests Recent Labs  Lab 10/28/18 1327  AST 11*  ALT 12  ALKPHOS 57  BILITOT 0.9  PROT 5.3*   ALBUMIN 3.3*   Recent Labs  Lab 10/28/18 1830  LIPASE 23  AMYLASE 28   CBC Recent Labs  Lab 10/28/18 1327 10/29/18 0530  WBC 9.4 16.1*  NEUTROABS 7.1 13.8*  HGB 9.9* 9.5*  HCT 30.8* 29.8*  MCV 87.5 87.1  PLT 123* 630*   Basic Metabolic Panel Recent Labs  Lab 10/28/18 1327 10/28/18 1637 10/28/18 2142 10/29/18 0530 10/31/18 0533  NA 128*  --  134* 133* 140  K 4.2  --  3.1* 3.6 3.5  CL 96*  --  105 103 111  CO2 20*  --  17* 20* 21*  GLUCOSE 790* 656* 182* 198* 112*  BUN 32*  --  34* 30* 28*  CREATININE 3.31*  --  3.15* 3.30* 3.79*  CALCIUM 9.7  --  8.9 9.0 7.8*   Iron/TIBC/Ferritin/ %Sat    Component Value Date/Time   IRON 98 10/14/2018 1230   TIBC 214 (L) 10/14/2018 1230   FERRITIN 571 (H) 10/14/2018 1230   IRONPCTSAT 46 (H) 10/14/2018 1230   IRONPCTSAT 23 07/09/2011 0919    Vitals:   10/31/18 1000 10/31/18 1100 10/31/18 1200 10/31/18 1300  BP: (!) 150/65 (!) 159/66 (!) 176/80 (!) 149/72  Pulse: (!) 59 (!) 56 (!) 55 (!) 56  Resp: '13 13 12 '$ (!) 22  Temp:   98.5 F (36.9 C)   TempSrc:   Axillary   SpO2: 99% 97% 98% 100%  Weight:        Exam Gen awake, confused and unable to give much history, pleasant, no distress, chron ill appearing No rash, cyanosis or gangrene Sclera anicteric, throat clear slightly dry No jvd or bruits, no meningismus Chest clear bilat to bases, no rales/ wheezing RRR no MRG Abd soft ntnd no mass or ascites +bs GU normal male w condom cath draining clear whitish urine MS no joint effusions or deformity Ext bilat BKA, no stump  edema, no wounds or ulcers Neuro is alert, confused, pleasant and nonfocal    Home meds:  - amlodipine 10  - aspirin 81/ atorvastatin 10 qd  - omeprazole 40 qd/ ipratropium-albuterol prn/ tamsulosin 0.4  - insulin glargine 20 qd  - pred 5 qd/ tacrolimus '4mg'$  am + '3mg'$  pm/ mycophenolate 360 bid    Assessment/ Plan: 1. Speech/ memory abnormalities- acute onset, sp TPA on 6/10. No CVA by MRI/ CT.   Cerebral angio showed no occlusions. Some improvement but still sig speech issues. Tacrolimus level is normal. Cont renal tx meds at usual doses.   2. Fever - 102 deg in hosp here, afeb now, blood cx's negative.  3. CKD IV / renal Tx - function is at baseline, creat 3.9.  Euvolemic on exam.  4. HTN - norvasc only at home 5. Afib w/ RVR 6. HL 7. DM 1 , uncont on admission 8. Colonic ileus - per Myna Hidalgo  MD 10/31/2018, 1:17 PM

## 2018-10-31 NOTE — Plan of Care (Signed)
Patient had a restful night with no nausea. Patient is communicating clearer and able to hold short discussions. He stated he knew he was in the hospital, but he didn't know why. Reoriented him to his condition and plan of care.

## 2018-10-31 NOTE — Progress Notes (Signed)
NAME:  RIDLEY SCHEWE, MRN:  [id number], DOB:  Oct 04, 1955, LOS: 3 ADMISSION DATE:  10/28/2018, CONSULTATION DATE:  6/10 REFERRING MD:  Estanislado Pandy, CHIEF COMPLAINT:  R sided weakness   Brief History   63 y/o male admitted with L MCA stroke, received IV TPA, IR angiogram afterwards, noted to be confused after procedure.  Past Medical History  Atrial fib DM2 Colon cancer 2002 ESRD> Kidney transplant GERD HTN  Significant Hospital Events   6/10: L MCA syndrome  Consults:  6/10 IR 6/10 CCM  Procedures:  6/10:TPA given  Significant Diagnostic Tests:  6/10: cth: 1. No evidence of acute intracranial abnormality. 2. ASPECTS is 10. 3. Mild chronic small vessel ischemic disease and cerebral atrophy. 6/11: KUB (personally interpreted) diffuse colonic dilatation consistent with ileus. 6/11: MRI shows nil acute 6/12: improving gas pattern.  Micro Data:  6/10 covid: negative  Antimicrobials:  n/a   Interim history/subjective:  Improved appetite overnight No nausea/vomiting Mental status improved  Objective   Blood pressure 135/68, pulse (!) 56, temperature 98.4 F (36.9 C), temperature source Oral, resp. rate (!) 9, weight 71.6 kg, SpO2 100 %.        Intake/Output Summary (Last 24 hours) at 10/31/2018 0753 Last data filed at 10/31/2018 0700 Gross per 24 hour  Intake 2423.73 ml  Output 685 ml  Net 1738.73 ml   Filed Weights   10/28/18 1300 10/28/18 1352  Weight: 71.6 kg 71.6 kg    Examination:  General:  Resting comfortably in bed HENT: NCAT OP clear PULM: CTA B, normal effort CV: RRR, no mgr GI: BS+, soft, nontender MSK: normal bulk and tone Neuro: awake, alert, no distress, MAEW   Resolved Hospital Problem list     Assessment & Plan:  L MCA Stroke (Pseudostroke?) > per stroke service they don't think he had a stroke and think that symptoms were related to hyperglycemia Monitor neuro status Resume ASA  Acute on chronic renal failure, history of  renal transplant, worsening renal function Received IV contrast for angiogram on admission Tacrolimus level still not back Restart prednisone Restart mycophenolate Renal consult  DM1 with hyperglycemia SSI, levemir  Nausea/vomiting> dysmotility? Hold IV Fluids Carb modified  Atrial fib Resume asa Tele  Best practice:  Diet: carb modified Pain/Anxiety/Delirium protocol (if indicated): n/a VAP protocol (if indicated): n/a DVT prophylaxis: lovenox GI prophylaxis: n/a Glucose control: SSI Mobility: out of bed, PT consult Code Status: full Family Communication: none bedside Disposition: out of ICU, PCCM off  Labs   CBC: Recent Labs  Lab 10/28/18 1327 10/29/18 0530  WBC 9.4 16.1*  NEUTROABS 7.1 13.8*  HGB 9.9* 9.5*  HCT 30.8* 29.8*  MCV 87.5 87.1  PLT 123* 140*    Basic Metabolic Panel: Recent Labs  Lab 10/28/18 1327 10/28/18 1637 10/28/18 2142 10/29/18 0530 10/31/18 0533  NA 128*  --  134* 133* 140  K 4.2  --  3.1* 3.6 3.5  CL 96*  --  105 103 111  CO2 20*  --  17* 20* 21*  GLUCOSE 790* 656* 182* 198* 112*  BUN 32*  --  34* 30* 28*  CREATININE 3.31*  --  3.15* 3.30* 3.79*  CALCIUM 9.7  --  8.9 9.0 7.8*  MG  --   --  1.1* 1.6* 2.3   GFR: Estimated Creatinine Clearance: 19.7 mL/min (A) (by C-G formula based on SCr of 3.79 mg/dL (H)). Recent Labs  Lab 10/28/18 1327 10/29/18 0530  WBC 9.4 16.1*    Liver Function  Tests: Recent Labs  Lab 10/28/18 1327  AST 11*  ALT 12  ALKPHOS 57  BILITOT 0.9  PROT 5.3*  ALBUMIN 3.3*   Recent Labs  Lab 10/28/18 1830  LIPASE 23  AMYLASE 28   No results for input(s): AMMONIA in the last 168 hours.  ABG    Component Value Date/Time   TCO2 31 06/14/2013 0627     Coagulation Profile: Recent Labs  Lab 10/28/18 1327  INR 1.2    Cardiac Enzymes: No results for input(s): CKTOTAL, CKMB, CKMBINDEX, TROPONINI in the last 168 hours.  HbA1C: Hgb A1c MFr Bld  Date/Time Value Ref Range Status   10/29/2018 05:29 AM 13.6 (H) 4.8 - 5.6 % Final    Comment:    (NOTE) Pre diabetes:          5.7%-6.4% Diabetes:              >6.4% Glycemic control for   <7.0% adults with diabetes   05/24/2018 01:00 AM 7.9 (H) 4.8 - 5.6 % Final    Comment:    (NOTE) Pre diabetes:          5.7%-6.4% Diabetes:              >6.4% Glycemic control for   <7.0% adults with diabetes     CBG: Recent Labs  Lab 10/30/18 1629 10/30/18 1709 10/30/18 1952 10/30/18 2330 10/31/18 0313  GLUCAP 61* 96 109* 129* 106*     Critical care time: n/a    Roselie Awkward, MD [place] [facility] Pager: [phone] Cell: [phone] If no response, call [phone]

## 2018-11-01 DIAGNOSIS — E1169 Type 2 diabetes mellitus with other specified complication: Secondary | ICD-10-CM

## 2018-11-01 DIAGNOSIS — R471 Dysarthria and anarthria: Secondary | ICD-10-CM

## 2018-11-01 DIAGNOSIS — Z794 Long term (current) use of insulin: Secondary | ICD-10-CM

## 2018-11-01 DIAGNOSIS — R4189 Other symptoms and signs involving cognitive functions and awareness: Secondary | ICD-10-CM

## 2018-11-01 DIAGNOSIS — I10 Essential (primary) hypertension: Secondary | ICD-10-CM

## 2018-11-01 LAB — BASIC METABOLIC PANEL
Anion gap: 9 (ref 5–15)
BUN: 25 mg/dL — ABNORMAL HIGH (ref 8–23)
CO2: 21 mmol/L — ABNORMAL LOW (ref 22–32)
Calcium: 8 mg/dL — ABNORMAL LOW (ref 8.9–10.3)
Chloride: 110 mmol/L (ref 98–111)
Creatinine, Ser: 3.63 mg/dL — ABNORMAL HIGH (ref 0.61–1.24)
GFR calc Af Amer: 19 mL/min — ABNORMAL LOW (ref >60)
GFR calc non Af Amer: 17 mL/min — ABNORMAL LOW (ref >60)
Glucose, Bld: 74 mg/dL (ref 70–99)
Potassium: 3.5 mmol/L (ref 3.5–5.1)
Sodium: 140 mmol/L (ref 135–145)

## 2018-11-01 LAB — URINALYSIS, ROUTINE W REFLEX MICROSCOPIC
Bacteria, UA: NONE SEEN
Bilirubin Urine: NEGATIVE
Glucose, UA: 150 mg/dL — AB
Ketones, ur: NEGATIVE mg/dL
Leukocytes,Ua: NEGATIVE
Nitrite: NEGATIVE
Protein, ur: 30 mg/dL — AB
Specific Gravity, Urine: 1.017 (ref 1.005–1.030)
pH: 5 (ref 5.0–8.0)

## 2018-11-01 LAB — TACROLIMUS LEVEL: Tacrolimus (FK506) - LabCorp: 4 ng/mL (ref 2.0–20.0)

## 2018-11-01 LAB — GLUCOSE, CAPILLARY
Glucose-Capillary: 128 mg/dL — ABNORMAL HIGH (ref 70–99)
Glucose-Capillary: 168 mg/dL — ABNORMAL HIGH (ref 70–99)
Glucose-Capillary: 272 mg/dL — ABNORMAL HIGH (ref 70–99)
Glucose-Capillary: 319 mg/dL — ABNORMAL HIGH (ref 70–99)
Glucose-Capillary: 60 mg/dL — ABNORMAL LOW (ref 70–99)
Glucose-Capillary: 92 mg/dL (ref 70–99)
Glucose-Capillary: 94 mg/dL (ref 70–99)

## 2018-11-01 MED ORDER — INSULIN DETEMIR 100 UNIT/ML ~~LOC~~ SOLN
10.0000 [IU] | Freq: Every day | SUBCUTANEOUS | Status: DC
  Administered 2018-11-01: 10 [IU] via SUBCUTANEOUS
  Filled 2018-11-01 (×2): qty 0.1

## 2018-11-01 MED ORDER — INSULIN DETEMIR 100 UNIT/ML ~~LOC~~ SOLN: 10.0000 [IU] | Freq: Every day | SUBCUTANEOUS | Status: DC

## 2018-11-01 NOTE — Progress Notes (Signed)
Occupational Therapy Treatment Patient Details Name: Paul [name] MRN: [id number] DOB: 08-04-1955 Today's Date: 11/01/2018    History of present illness This 63 y.o. male admitted with AMS,  aphasia, Rt sided weakness, and Lt gaze preference.   CT of head showed no acute abnormality.  tPA was administered.   Pt was prepped for thrombectomy due to suspected MCA occlusion, but no occlusion was found.  MRI showed no acute abnormality.  Blood glucose was 790 on admission.  PMH includes:  IDDM, Bil. BKA (with prostheses), A-Fib, h/o duodenal adenocarcinoma s/p whipple procedure, Diabetic neuropathy, ESRD, s/p renal transplant, HTN    OT comments  Pt demonstrates significant improvements today.  He was able to move to EOB with min guard assist, and don his prostheses with supervision and min verbal cues.  He was able to ambulate in room with min A, progressing to min guard assist.  He is very slow to process, and demonstrates sustained attention - he is easily distracted.  He is fully oriented today.  Continue to recommend CIR, however, he may progress well enough to discharge straight home.  Left VM for wife re: current status and progress.   Follow Up Recommendations  CIR;Supervision/Assistance - 24 hour    Equipment Recommendations  None recommended by OT    Recommendations for Other Services      Precautions / Restrictions Precautions Precautions: Fall       Mobility Bed Mobility Overal bed mobility: Needs Assistance Bed Mobility: Supine to Sit     Supine to sit: Min guard     General bed mobility comments: increaset time   Transfers Overall transfer level: Needs assistance Equipment used: Rolling walker (2 wheeled) Transfers: Stand Pivot Transfers;Sit to/from Stand Sit to Stand: Min assist;From elevated surface Stand pivot transfers: Min assist       General transfer comment: min A to move sit to stand     Balance Overall balance assessment: Needs  assistance Sitting-balance support: Feet supported Sitting balance-Leahy Scale: Fair     Standing balance support: Bilateral upper extremity supported Standing balance-Leahy Scale: Poor Standing balance comment: requires bil. UE support and min A, progressing to min guard assist                             ADL either performed or assessed with clinical judgement   ADL Overall ADL's : Needs assistance/impaired Eating/Feeding: Independent;Sitting               Upper Body Dressing : Minimal assistance   Lower Body Dressing: Minimal assistance;Sit to/from stand Lower Body Dressing Details (indicate cue type and reason): able to don prostheses EOB with min A to move into standing and min cues for problem solving  Toilet Transfer: Minimal assistance;Ambulation;Comfort height toilet;Grab bars;RW   Toileting- Clothing Manipulation and Hygiene: Minimal assistance;Sit to/from stand       Functional mobility during ADLs: Minimal assistance;Rolling walker       Vision       Perception     Praxis      Cognition Arousal/Alertness: Awake/alert Behavior During Therapy: Flat affect;Impulsive Overall Cognitive Status: Impaired/Different from baseline Area of Impairment: Attention;Following commands;Awareness;Problem solving                   Current Attention Level: Sustained   Following Commands: Follows one step commands consistently;Follows multi-step commands inconsistently;Follows multi-step commands with increased time;Follows one step commands with increased time   Awareness:  Intellectual;Emergent Problem Solving: Slow processing;Decreased initiation General Comments: Pt was able to don his prostheses with min cues.  Processing is delayed and he has word finding difficulties.  He still has flat affect, but will smile occasionally         Exercises     Shoulder Instructions       General Comments Left VM for pt's wife with status update      Pertinent Vitals/ Pain       Pain Assessment: Faces Faces Pain Scale: No hurt  Home Living                                          Prior Functioning/Environment              Frequency  Min 3X/week        Progress Toward Goals  OT Goals(current goals can now be found in the care plan section)  Progress towards OT goals: Progressing toward goals     Plan Discharge plan remains appropriate    Co-evaluation                 AM-PAC OT "6 Clicks" Daily Activity     Outcome Measure   Help from another person eating meals?: None Help from another person taking care of personal grooming?: A Little Help from another person toileting, which includes using toliet, bedpan, or urinal?: A Little Help from another person bathing (including washing, rinsing, drying)?: A Little Help from another person to put on and taking off regular upper body clothing?: A Little Help from another person to put on and taking off regular lower body clothing?: A Little 6 Click Score: 19    End of Session Equipment Utilized During Treatment: Gait belt;Rolling walker;Other (comment)(prostheses )  OT Visit Diagnosis: Unsteadiness on feet (R26.81);Cognitive communication deficit (R41.841) Symptoms and signs involving cognitive functions: Cerebral infarction   Activity Tolerance Patient tolerated treatment well   Patient Left in chair;with call bell/phone within reach;with chair alarm set   Nurse Communication Mobility status        Time: 8299-3716 OT Time Calculation (min): 38 min  Charges: OT General Charges $OT Visit: 1 Visit OT Treatments $Self Care/Home Management : 38-52 mins  Lucille Passy, OTR/L [place] Pager [phone] Office [phone]    Lucille Passy M 11/01/2018, 5:29 PM

## 2018-11-01 NOTE — Progress Notes (Signed)
Hypoglycemic Event  CBG: 56  Treatment: OJ x2  Symptoms: none  Follow-up CBG: Time: 1364 CBG Result:92  Possible Reasons for Event: Levemir, novolog 1 unit at 2000 and 0000, and possible lack of PO intake.    Comments/MD notified:patient has CBG of 56.  One cup of OJ given, CBG 60 20 minutes later, one more cup of juice given, CBG 92 25 minutes later.      [place] [facility]

## 2018-11-01 NOTE — Progress Notes (Signed)
PROGRESS NOTE    Paul [name]  DJS:[id number]  DOB: [birth year]  DOA: 10/28/2018 PCP: [name], MD  Brief Narrative: 63 year old male with history of hypertension, diabetes, depression, renal transplant, chronic kidney disease stage IV, calciphylaxis, anemia, atrial fibrillation not on chronic anticoagulation and who is status post bilateral BKA presented to the ED on June 10 with complaints of dysarthria/memory issues and underwent a stroke work-up after code stroke was called.  Patient was seen by neurology and underwent TPA subsequent to which he was monitored closely in the ICU.  Stroke work-up was essentially negative with unremarkable MRI except for age-related small vessel disease and cerebral atrophy.  EEG showed diffuse mild slowing with no seizure activity.  His symptoms supposedly improved and neurology felt his presenting symptoms were  likely physiologic in the setting of hyperglycemia (blood glucose greater than 700).  Patient states he ran out of Lantus insulin for couple of days prior to admission (takes 17 units Lantus daily).  Patient states he was on Coumadin in the past for A. fib but unclear why it was stopped.  He denies any history of GI bleeds.  While in ICU, patient noted to spike fever of 102.2 F on June 11 and has slowly defervesced since then.  His T-max today so far is 99.7.  He denies any sore throat, earache or cough or dysuria or abdominal pain or diarrhea or skin rashes.  Nephrology was consulted by intensivist team given fevers and mildly worsened creatinine level. Patient transferred out of ICU on 10 units of Lantus twice daily but has been having hypoglycemic episodes intermittently with blood glucose dropping to 60s.  His blood glucose early this morning at 5 AM was 60 and at noon improved to 168.  Morning dose of Lantus was held.  Subjective:  Patient sitting comfortably and bedside nurse in the room during my visit.  He converses normally and was okay with  me sharing information with wife.  He was agreeable with holding morning Lantus insulin.  When I went back to his room few minutes later to inquire about Coumadin, he appeared to be talking slowly as if he was having trouble recollecting and expressing thoughts.  He stated he has been having trouble expressing his words since presentation.  Objective: Vitals:   11/01/18 0011 11/01/18 0453 11/01/18 0829 11/01/18 1205  BP: (!) 147/65 (!) 154/66 (!) 141/52 (!) 135/40  Pulse: (!) 47 (!) 48 (!) 50 (!) 50  Resp: 16 14 18 18   Temp: 98.5 F (36.9 C) 98.2 F (36.8 C) 98.5 F (36.9 C) 99.7 F (37.6 C)  TempSrc: Oral Oral Oral Oral  SpO2: 100% 100% 97% 100%  Weight:  73.8 kg      Intake/Output Summary (Last 24 hours) at 11/01/2018 1400 Last data filed at 11/01/2018 1300 Gross per 24 hour  Intake 619.59 ml  Output 700 ml  Net -80.41 ml   Filed Weights   10/28/18 1352 10/31/18 1739 11/01/18 0453  Weight: 71.6 kg 75.3 kg 73.8 kg    Physical Examination:  General exam: Appears calm and comfortable  Respiratory system: Clear to auscultation. Respiratory effort normal. Cardiovascular system: S1 & S2 heard, RRR. No JVD, murmurs, rubs, gallops or clicks.  Gastrointestinal system: Abdomen is nondistended, soft and nontender. No organomegaly or masses felt. Normal bowel sounds heard. Central nervous system: Alert and oriented.  Dysarthria as described above.  No focal neurological deficits. Extremities: Status post bilateral BKA Skin: No rashes, lesions or ulcers Psychiatry:  Mood & affect flat    Data Reviewed: I have personally reviewed following labs and imaging studies  CBC: Recent Labs  Lab 10/28/18 1327 10/29/18 0530  WBC 9.4 16.1*  NEUTROABS 7.1 13.8*  HGB 9.9* 9.5*  HCT 30.8* 29.8*  MCV 87.5 87.1  PLT 123* 295*   Basic Metabolic Panel: Recent Labs  Lab 10/28/18 1327 10/28/18 1637 10/28/18 2142 10/29/18 0530 10/31/18 0533 11/01/18 0530  NA 128*  --  134* 133* 140 140   K 4.2  --  3.1* 3.6 3.5 3.5  CL 96*  --  105 103 111 110  CO2 20*  --  17* 20* 21* 21*  GLUCOSE 790* 656* 182* 198* 112* 74  BUN 32*  --  34* 30* 28* 25*  CREATININE 3.31*  --  3.15* 3.30* 3.79* 3.63*  CALCIUM 9.7  --  8.9 9.0 7.8* 8.0*  MG  --   --  1.1* 1.6* 2.3  --    GFR: Estimated Creatinine Clearance: 21.2 mL/min (A) (by C-G formula based on SCr of 3.63 mg/dL (H)). Liver Function Tests: Recent Labs  Lab 10/28/18 1327  AST 11*  ALT 12  ALKPHOS 57  BILITOT 0.9  PROT 5.3*  ALBUMIN 3.3*   Recent Labs  Lab 10/28/18 1830  LIPASE 23  AMYLASE 28   No results for input(s): AMMONIA in the last 168 hours. Coagulation Profile: Recent Labs  Lab 10/28/18 1327  INR 1.2   Cardiac Enzymes: No results for input(s): CKTOTAL, CKMB, CKMBINDEX, TROPONINI in the last 168 hours. BNP (last 3 results) No results for input(s): PROBNP in the last 8760 hours. HbA1C: No results for input(s): HGBA1C in the last 72 hours. CBG: Recent Labs  Lab 11/01/18 0012 11/01/18 0521 11/01/18 0553 11/01/18 0825 11/01/18 1202  GLUCAP 128* 60* 92 94 168*   Lipid Profile: Recent Labs    10/30/18 0530  TRIG 57   Thyroid Function Tests: No results for input(s): TSH, T4TOTAL, FREET4, T3FREE, THYROIDAB in the last 72 hours. Anemia Panel: No results for input(s): VITAMINB12, FOLATE, FERRITIN, TIBC, IRON, RETICCTPCT in the last 72 hours. Sepsis Labs: No results for input(s): PROCALCITON, LATICACIDVEN in the last 168 hours.  Recent Results (from the past 240 hour(s))  SARS Coronavirus 2     Status: None   Collection Time: 10/28/18  1:51 PM  Result Value Ref Range Status   SARS Coronavirus 2 NOT DETECTED NOT DETECTED Final    Comment: (NOTE) SARS-CoV-2 target nucleic acids are NOT DETECTED. The SARS-CoV-2 RNA is generally detectable in upper and lower respiratory specimens during the acute phase of infection.  Negative  results do not preclude SARS-CoV-2 infection, do not rule out  co-infections with other pathogens, and should not be used as the sole basis for treatment or other patient management decisions.  Negative results must be combined with clinical observations, patient history, and epidemiological information. The expected result is Not Detected. Fact Sheet for Patients: http://www.biofiredefense.com/wp-content/uploads/2020/03/BIOFIRE-COVID -19-patients.pdf Fact Sheet for Healthcare Providers: http://www.biofiredefense.com/wp-content/uploads/2020/03/BIOFIRE-COVID -19-hcp.pdf This test is not yet approved or cleared by the [place] and  has been authorized for detection and/or diagnosis of SARS-CoV-2 by FDA under an Emergency Use Authorization (EUA).  This EUA will remain in effec t (meaning this test can be used) for the duration of  the COVID-19 declaration under Section 564(b)(1) of the Act, 21 U.S.C. section 360bbb-3(b)(1), unless the authorization is terminated or revoked sooner. Performed at [facility], [place] [address] [place], [place] [address]  MRSA PCR Screening     Status: None   Collection Time: 10/28/18  6:15 PM   Specimen: Nasal Mucosa; Nasopharyngeal  Result Value Ref Range Status   MRSA by PCR NEGATIVE NEGATIVE Final    Comment:        The GeneXpert MRSA Assay (FDA approved for NASAL specimens only), is one component of a comprehensive MRSA colonization surveillance program. It is not intended to diagnose MRSA infection nor to guide or monitor treatment for MRSA infections. Performed at [facility], [place] [address] [place], [place] [address]   Culture, blood (routine x 2)     Status: None (Preliminary result)   Collection Time: 10/29/18  4:56 PM   Specimen: BLOOD  Result Value Ref Range Status   Specimen Description BLOOD LEFT ANTECUBITAL  Final   Special Requests   Final    BOTTLES DRAWN AEROBIC ONLY Blood Culture adequate volume   Culture   Final    NO GROWTH 3 DAYS Performed at [facility], [place] [address], [place], [place] [address]    Report Status PENDING  Incomplete  Culture, blood (routine x 2)     Status: None (Preliminary result)   Collection Time: 10/29/18  6:45 PM   Specimen: BLOOD  Result Value Ref Range Status   Specimen Description BLOOD LEFT ANTECUBITAL  Final   Special Requests   Final    BOTTLES DRAWN AEROBIC ONLY Blood Culture results may not be optimal due to an inadequate volume of blood received in culture bottles   Culture   Final    NO GROWTH 3 DAYS Performed at [facility], [place] [address], [place], [place] [address]    Report Status PENDING  Incomplete      Radiology Studies: No results found.      Scheduled Meds: . amLODipine  10 mg Oral Daily  . aspirin EC  81 mg Oral Daily  . atorvastatin  10 mg Oral Daily  . enoxaparin (LOVENOX) injection  30 mg Subcutaneous Q24H  . insulin aspart  1-3 Units Subcutaneous Q4H  . insulin detemir  10 Units Subcutaneous QHS  . mouth rinse  15 mL Mouth Rinse BID  . metoCLOPramide (REGLAN) injection  10 mg Intravenous Q6H  . metoprolol tartrate  50 mg Oral BID  . mycophenolate  360 mg Oral BID  . predniSONE  5 mg Oral Q breakfast  . sodium chloride flush  3 mL Intravenous Once  . tacrolimus  4 mg Oral Q24H   And  . tacrolimus  3 mg Oral Q24H   Continuous Infusions: . dextrose 5 % and 0.9% NaCl 10 mL/hr at 10/31/18 1600    Assessment & Plan:    1.  Dysarthria/memory issues: TIA versus dementia (MRI shows atrophy) versus metabolic in the setting of fluctuating blood glucose levels versus psychogenic.  Stroke work-up negative and neurology felt his symptoms were physiological rather than stroke related.  EEG showed diffuse slowing with no focal seizure activity.  Patient was significantly hyperglycemic when presented to the ED, now having hypoglycemic episodes.  Continue baby aspirin, adjust insulin dosage.  Requested nurse to monitor speech pattern in between visits.  Will need  neurocognitive evaluation at some point.  Speech therapy to follow-up  2.  Renal transplant/chronic kidney disease stage IV: Patient presented with creatinine of 3.3 and nephrology was consulted given fevers and jump in creatinine to 3.9.  Seen by Dr. Jonnie Finner who felt patient's renal function is at  its baseline and did not feel patient had any acute component.  His tacrolimus level is normal.  Patient previously on hemodialysis until renal transplant 3 to 4 years ago.  He has had progressive kidney disease since then according to wife patient supposed to follow-up with [facility] and get back on transplant list.  Continue prednisone and tacrolimus  3.  Fever: Unclear source.   Defervescing now.  Watch for recurrence.  He did have leukocytosis of 16,000 on June 11.  Patient however on chronic steroids.  Will repeat CBC.  Check UA (if able to give sample).  Blood cultures from June 11- so far.  COVID negative on presentation.  Normal chest x-ray on presentation.  No evidence of diarrhea.  4.  Poorly controlled diabetes mellitus with hyperglycemia: Present on admission.  Patient reports running out of Lantus insulin.  He apparently takes 17 units daily at baseline.  He has been on 10 units twice daily while here and blood glucose fluctuating between 60-1 50.  Will change Lantus to 10 units nightly and adjust in a.m. further.  Continue sliding scale insulin.  5.  Nausea vomiting:?  Secondary to gastroparesis.  Now resolved.  Off IV fluids.  Change Reglan to as needed use only  6. Chronic atrial fibrillation: Rate controlled on metoprolol.  Patient reports being on Coumadin in the past but does not know the indication for discontinuation.  Will discuss with wife.  7.  Hypertension: Continue Norvasc and metoprolol.  8.  Hyperlipidemia: Statins  DVT prophylaxis: Enoxaparin Code Status: Full code Family / Patient Communication:  Called wife [name] at [phone] and left a message as she didn't answer. She  apparently requested nurse to have MD call her.  Disposition Plan: Home when medically cleared, possibly in next 24 to 48 hours if blood glucose well controlled and afebrile.     LOS: 4 days    Time spent: 35 minutes    [place] Shi, MD [facility] Pager [phone]  If 7PM-7AM, please contact night-coverage [email or web address] Password [email or web address] 11/01/2018, 2:00 PM

## 2018-11-01 NOTE — Progress Notes (Signed)
[facility] Progress Note  Subjective: looks better today a bit  Vitals:   10/31/18 2006 11/01/18 0011 11/01/18 0453 11/01/18 0829  BP: (!) 145/66 (!) 147/65 (!) 154/66 (!) 141/52  Pulse: (!) 51 (!) 47 (!) 48 (!) 50  Resp: 16 16 14 18   Temp: 99.4 F (37.4 C) 98.5 F (36.9 C) 98.2 F (36.8 C) 98.5 F (36.9 C)  TempSrc: Oral Oral Oral Oral  SpO2: 98% 100% 100% 97%  Weight:   73.8 kg     Inpatient medications: . amLODipine  10 mg Oral Daily  . aspirin EC  81 mg Oral Daily  . atorvastatin  10 mg Oral Daily  . enoxaparin (LOVENOX) injection  30 mg Subcutaneous Q24H  . insulin aspart  1-3 Units Subcutaneous Q4H  . insulin detemir  10 Units Subcutaneous Q12H  . mouth rinse  15 mL Mouth Rinse BID  . metoCLOPramide (REGLAN) injection  10 mg Intravenous Q6H  . metoprolol tartrate  50 mg Oral BID  . mycophenolate  360 mg Oral BID  . predniSONE  5 mg Oral Q breakfast  . sodium chloride flush  3 mL Intravenous Once  . tacrolimus  4 mg Oral Q24H   And  . tacrolimus  3 mg Oral Q24H   . dextrose 5 % and 0.9% NaCl 10 mL/hr at 10/31/18 1600   [DISCONTINUED] acetaminophen **OR** [DISCONTINUED] acetaminophen (TYLENOL) oral liquid 160 mg/5 mL **OR** acetaminophen, docusate, labetalol, ondansetron (ZOFRAN) IV, senna-docusate    Exam: Gen awake, more alert today No jvd Chest clear bilat to bases RRR no MRG Abd soft ntnd no mass or ascites +bs MS no joint effusions or deformity Ext bilat BKA, no stump edema Neuro is alert, confused, pleasant and nonfocal    Home meds:  - amlodipine 10  - aspirin 81/ atorvastatin 10 qd  - omeprazole 40 qd/ ipratropium-albuterol prn/ tamsulosin 0.4  - insulin glargine 20 qd  - pred 5 qd/ tacrolimus 4mg  am + 3mg  pm/ mycophenolate 360 bid    Assessment/ Plan: 1. CKD IV / renal Tx - function is at baseline, creat 3.9.  Euvolemic on exam, tacrolimus level =5.6, which is in therapeutic range.  No other suggestions, will sign off.   2. Speech/ memory abnormalities- acute onset, sp TPA on 6/10 for suspected CVA. No CVA seen by MRI/ CT.  Cerebral angio showed no occlusions. Some improvement, still cognitive/ speech deficits. Per neuro/ primary.  Tacrolimus level was normal. 3. Fevers - resolved, on admit, not on abx 4. HTN - norvasc only at home 5. Afib w/ RVR 6. HL 7. DM 1 , uncont on admission 8. Colonic ileus - per [facility] 11/01/2018, 9:53 AM  Iron/TIBC/Ferritin/ %Sat    Component Value Date/Time   IRON 98 10/14/2018 1230   TIBC 214 (L) 10/14/2018 1230   FERRITIN 571 (H) 10/14/2018 1230   IRONPCTSAT 46 (H) 10/14/2018 1230   IRONPCTSAT 23 07/09/2011 0919   Recent Labs  Lab 10/28/18 1327  11/01/18 0530  NA 128*   < > 140  K 4.2   < > 3.5  CL 96*   < > 110  CO2 20*   < > 21*  GLUCOSE 790*   < > 74  BUN 32*   < > 25*  CREATININE 3.31*   < > 3.63*  CALCIUM 9.7   < > 8.0*  ALBUMIN 3.3*  --   --   INR 1.2  --   --    < > =  values in this interval not displayed.   Recent Labs  Lab 10/28/18 1327  AST 11*  ALT 12  ALKPHOS 57  BILITOT 0.9  PROT 5.3*   Recent Labs  Lab 10/29/18 0530  WBC 16.1*  HGB 9.5*  HCT 29.8*  PLT 140*

## 2018-11-02 DIAGNOSIS — G9341 Metabolic encephalopathy: Secondary | ICD-10-CM

## 2018-11-02 LAB — CBC
HCT: 28.6 % — ABNORMAL LOW (ref 39.0–52.0)
Hemoglobin: 8.8 g/dL — ABNORMAL LOW (ref 13.0–17.0)
MCH: 28.1 pg (ref 26.0–34.0)
MCHC: 30.8 g/dL (ref 30.0–36.0)
MCV: 91.4 fL (ref 80.0–100.0)
Platelets: 129 10*3/uL — ABNORMAL LOW (ref 150–400)
RBC: 3.13 MIL/uL — ABNORMAL LOW (ref 4.22–5.81)
RDW: 13.6 % (ref 11.5–15.5)
WBC: 9 10*3/uL (ref 4.0–10.5)
nRBC: 0 % (ref 0.0–0.2)

## 2018-11-02 LAB — BASIC METABOLIC PANEL
Anion gap: 8 (ref 5–15)
BUN: 37 mg/dL — ABNORMAL HIGH (ref 8–23)
CO2: 21 mmol/L — ABNORMAL LOW (ref 22–32)
Calcium: 7.5 mg/dL — ABNORMAL LOW (ref 8.9–10.3)
Chloride: 110 mmol/L (ref 98–111)
Creatinine, Ser: 4.1 mg/dL — ABNORMAL HIGH (ref 0.61–1.24)
GFR calc Af Amer: 17 mL/min — ABNORMAL LOW (ref >60)
GFR calc non Af Amer: 14 mL/min — ABNORMAL LOW (ref >60)
Glucose, Bld: 99 mg/dL (ref 70–99)
Potassium: 3.6 mmol/L (ref 3.5–5.1)
Sodium: 139 mmol/L (ref 135–145)

## 2018-11-02 LAB — GLUCOSE, CAPILLARY
Glucose-Capillary: 56 mg/dL — ABNORMAL LOW (ref 70–99)
Glucose-Capillary: 225 mg/dL — ABNORMAL HIGH (ref 70–99)
Glucose-Capillary: 91 mg/dL (ref 70–99)
Glucose-Capillary: 216 mg/dL — ABNORMAL HIGH (ref 70–99)
Glucose-Capillary: 383 mg/dL — ABNORMAL HIGH (ref 70–99)
Glucose-Capillary: 315 mg/dL — ABNORMAL HIGH (ref 70–99)

## 2018-11-02 MED ORDER — SODIUM BICARBONATE 650 MG PO TABS
1950.0000 mg | ORAL_TABLET | Freq: Four times a day (QID) | ORAL | Status: DC
  Administered 2018-11-02 – 2018-11-04 (×9): 1950 mg via ORAL
  Filled 2018-11-02 (×9): qty 3

## 2018-11-02 MED ORDER — CALCITRIOL 0.5 MCG PO CAPS
0.5000 ug | ORAL_CAPSULE | Freq: Every day | ORAL | Status: DC
  Administered 2018-11-02 – 2018-11-04 (×3): 0.5 ug via ORAL
  Filled 2018-11-02 (×3): qty 1

## 2018-11-02 MED ORDER — CALCIUM ACETATE (PHOS BINDER) 667 MG PO CAPS
1334.0000 mg | ORAL_CAPSULE | Freq: Three times a day (TID) | ORAL | Status: DC
  Administered 2018-11-02 – 2018-11-04 (×7): 1334 mg via ORAL
  Filled 2018-11-02 (×7): qty 2

## 2018-11-02 MED ORDER — INSULIN ASPART 100 UNIT/ML ~~LOC~~ SOLN
0.0000 [IU] | Freq: Three times a day (TID) | SUBCUTANEOUS | Status: DC
  Administered 2018-11-02: 9 [IU] via SUBCUTANEOUS
  Administered 2018-11-03: 2 [IU] via SUBCUTANEOUS
  Administered 2018-11-03 (×2): 5 [IU] via SUBCUTANEOUS
  Administered 2018-11-04: 3 [IU] via SUBCUTANEOUS
  Administered 2018-11-04 (×2): 5 [IU] via SUBCUTANEOUS

## 2018-11-02 MED ORDER — INSULIN GLARGINE 100 UNIT/ML ~~LOC~~ SOLN
12.0000 [IU] | Freq: Every day | SUBCUTANEOUS | Status: DC
  Administered 2018-11-02: 12 [IU] via SUBCUTANEOUS
  Filled 2018-11-02 (×2): qty 0.12

## 2018-11-02 NOTE — Progress Notes (Signed)
PROGRESS NOTE    Paul [name]  ION:[id number]  DOB: 1955-06-10  DOA: 10/28/2018 PCP: [name], MD  Brief Narrative: 63 year old male with history of hypertension, diabetes, depression, renal transplant, chronic kidney disease stage IV, calciphylaxis, anemia, atrial fibrillation not on chronic anticoagulation and who is status post bilateral BKA presented to the ED on June 10 with complaints of dysarthria/memory issues and underwent a stroke work-up after code stroke was called.  Patient was seen by neurology and underwent TPA subsequent to which he was monitored closely in the ICU.  Stroke work-up was essentially negative with unremarkable MRI except for age-related small vessel disease and cerebral atrophy.  EEG showed diffuse mild slowing with no seizure activity.  His symptoms supposedly improved and neurology felt his presenting symptoms were  likely physiologic in the setting of hyperglycemia (blood glucose greater than 700).  Patient states he ran out of Lantus insulin for couple of days prior to admission (takes 17 units Lantus daily).  Patient states he was on Coumadin in the past for A. fib but unclear why it was stopped.  He denies any history of GI bleeds. While in ICU, patient noted to spike fever of 102.2 F on June 11 and has slowly defervesced since then. Patient has been having rebounding hypo/hyperglycemic episodes intermittently.  Subjective: Patient continues to have episodes of high and low glucose over the past 72 hours.  Otherwise patient states he has no acute complaints denies shortness of breath, cough, chest pain, fevers, chills, nausea, vomiting, diarrhea, constipation, headache.  Question with wife over the phone indicates patient is more awake alert and near baseline however continues to be somewhat sluggish compared to usual and unable to answer some simple questions per her discussion.  Objective: Vitals:   11/02/18 0607 11/02/18 0607 11/02/18 0910 11/02/18 1124   BP:  (!) 129/59 (!) 131/59 (!) 132/57  Pulse:  (!) 49 (!) 51 (!) 51  Resp:    16  Temp:  98.9 F (37.2 C) 98.4 F (36.9 C) 97.9 F (36.6 C)  TempSrc:  Oral Oral Oral  SpO2:  98% 100% 99%  Weight: 75.4 kg       Intake/Output Summary (Last 24 hours) at 11/02/2018 1451 Last data filed at 11/02/2018 1251 Gross per 24 hour  Intake 1060 ml  Output 1010 ml  Net 50 ml   Filed Weights   10/31/18 1739 11/01/18 0453 11/02/18 0607  Weight: 75.3 kg 73.8 kg 75.4 kg    Physical Examination:  General:  Pleasantly resting in bed, No acute distress.  Nontoxic in appearance, alert to person and place - although somewhat sluggish with responses HEENT:  Normocephalic atraumatic.  Sclerae nonicteric, noninjected.  Extraocular movements intact bilaterally. Neck:  Without mass or deformity.  Trachea is midline. Lungs:  Clear to auscultate bilaterally without rhonchi, wheeze, or rales. Heart:  Regular rate and rhythm.  Without murmurs, rubs, or gallops. Abdomen:  Soft, nontender, nondistended.  Without guarding or rebound. Extremities: Bilateral BKA Skin:  Warm and dry, no erythema, no ulcerations.  Data Reviewed: I have personally reviewed following labs and imaging studies  CBC: Recent Labs  Lab 10/28/18 1327 10/29/18 0530 11/02/18 0602  WBC 9.4 16.1* 9.0  NEUTROABS 7.1 13.8*  --   HGB 9.9* 9.5* 8.8*  HCT 30.8* 29.8* 28.6*  MCV 87.5 87.1 91.4  PLT 123* 140* 244*   Basic Metabolic Panel: Recent Labs  Lab 10/28/18 2142 10/29/18 0530 10/31/18 0533 11/01/18 0530 11/02/18 0602  NA 134* 133*  140 140 139  K 3.1* 3.6 3.5 3.5 3.6  CL 105 103 111 110 110  CO2 17* 20* 21* 21* 21*  GLUCOSE 182* 198* 112* 74 99  BUN 34* 30* 28* 25* 37*  CREATININE 3.15* 3.30* 3.79* 3.63* 4.10*  CALCIUM 8.9 9.0 7.8* 8.0* 7.5*  MG 1.1* 1.6* 2.3  --   --    GFR: Estimated Creatinine Clearance: 19.2 mL/min (A) (by C-G formula based on SCr of 4.1 mg/dL (H)). Liver Function Tests: Recent Labs  Lab  10/28/18 1327  AST 11*  ALT 12  ALKPHOS 57  BILITOT 0.9  PROT 5.3*  ALBUMIN 3.3*   Recent Labs  Lab 10/28/18 1830  LIPASE 23  AMYLASE 28   No results for input(s): AMMONIA in the last 168 hours. Coagulation Profile: Recent Labs  Lab 10/28/18 1327  INR 1.2   Cardiac Enzymes: No results for input(s): CKTOTAL, CKMB, CKMBINDEX, TROPONINI in the last 168 hours. BNP (last 3 results) No results for input(s): PROBNP in the last 8760 hours. HbA1C: No results for input(s): HGBA1C in the last 72 hours. CBG: Recent Labs  Lab 11/01/18 1732 11/01/18 2010 11/02/18 0116 11/02/18 0542 11/02/18 1125  GLUCAP 272* 319* 225* 91 216*   Lipid Profile: No results for input(s): CHOL, HDL, LDLCALC, TRIG, CHOLHDL, LDLDIRECT in the last 72 hours. Thyroid Function Tests: No results for input(s): TSH, T4TOTAL, FREET4, T3FREE, THYROIDAB in the last 72 hours. Anemia Panel: No results for input(s): VITAMINB12, FOLATE, FERRITIN, TIBC, IRON, RETICCTPCT in the last 72 hours. Sepsis Labs: No results for input(s): PROCALCITON, LATICACIDVEN in the last 168 hours.  Recent Results (from the past 240 hour(s))  SARS Coronavirus 2     Status: None   Collection Time: 10/28/18  1:51 PM  Result Value Ref Range Status   SARS Coronavirus 2 NOT DETECTED NOT DETECTED Final    Comment: (NOTE) SARS-CoV-2 target nucleic acids are NOT DETECTED. The SARS-CoV-2 RNA is generally detectable in upper and lower respiratory specimens during the acute phase of infection.  Negative  results do not preclude SARS-CoV-2 infection, do not rule out co-infections with other pathogens, and should not be used as the sole basis for treatment or other patient management decisions.  Negative results must be combined with clinical observations, patient history, and epidemiological information. The expected result is Not Detected. Fact Sheet for Patients: http://www.biofiredefense.com/wp-content/uploads/2020/03/BIOFIRE-COVID  -19-patients.pdf Fact Sheet for Healthcare Providers: http://www.biofiredefense.com/wp-content/uploads/2020/03/BIOFIRE-COVID -19-hcp.pdf This test is not yet approved or cleared by the [place] and  has been authorized for detection and/or diagnosis of SARS-CoV-2 by FDA under an Emergency Use Authorization (EUA).  This EUA will remain in effec t (meaning this test can be used) for the duration of  the COVID-19 declaration under Section 564(b)(1) of the Act, 21 U.S.C. section 360bbb-3(b)(1), unless the authorization is terminated or revoked sooner. Performed at [facility], [place] [address] [place], [place] [address]   MRSA PCR Screening     Status: None   Collection Time: 10/28/18  6:15 PM   Specimen: Nasal Mucosa; Nasopharyngeal  Result Value Ref Range Status   MRSA by PCR NEGATIVE NEGATIVE Final    Comment:        The GeneXpert MRSA Assay (FDA approved for NASAL specimens only), is one component of a comprehensive MRSA colonization surveillance program. It is not intended to diagnose MRSA infection nor to guide or monitor treatment for MRSA infections. Performed at [facility], [place] [address] [place], [place]  [address]   Culture, blood (routine x 2)     Status: None (Preliminary result)   Collection Time: 10/29/18  4:56 PM   Specimen: BLOOD  Result Value Ref Range Status   Specimen Description BLOOD LEFT ANTECUBITAL  Final   Special Requests   Final    BOTTLES DRAWN AEROBIC ONLY Blood Culture adequate volume   Culture   Final    NO GROWTH 3 DAYS Performed at [facility], [address] [place], [place] [address]    Report Status PENDING  Incomplete  Culture, blood (routine x 2)     Status: None (Preliminary result)   Collection Time: 10/29/18  6:45 PM   Specimen: BLOOD  Result Value Ref Range Status   Specimen Description BLOOD LEFT ANTECUBITAL  Final   Special Requests   Final    BOTTLES DRAWN AEROBIC ONLY Blood Culture results may  not be optimal due to an inadequate volume of blood received in culture bottles   Culture   Final    NO GROWTH 3 DAYS Performed at [facility], [place] [address] [place], [place] [address]    Report Status PENDING  Incomplete    Radiology Studies: No results found.  Scheduled Meds: . amLODipine  10 mg Oral Daily  . aspirin EC  81 mg Oral Daily  . atorvastatin  10 mg Oral Daily  . calcitRIOL  0.5 mcg Oral Daily  . calcium acetate  1,334 mg Oral TID WC  . enoxaparin (LOVENOX) injection  30 mg Subcutaneous Q24H  . insulin aspart  0-9 Units Subcutaneous TID WC  . insulin glargine  12 Units Subcutaneous QHS  . mouth rinse  15 mL Mouth Rinse BID  . metoprolol tartrate  50 mg Oral BID  . mycophenolate  360 mg Oral BID  . predniSONE  5 mg Oral Q breakfast  . sodium bicarbonate  1,950 mg Oral QID  . sodium chloride flush  3 mL Intravenous Once  . tacrolimus  4 mg Oral Q24H   And  . tacrolimus  3 mg Oral Q24H   Continuous Infusions: . dextrose 5 % and 0.9% NaCl 10 mL/hr at 10/31/18 1600    Assessment & Plan:    Hospital Problems: Principal Problem:   Acute metabolic encephalopathy Active Problems:   ESRD on hemodialysis ([place])   Anemia   DM (diabetes mellitus) ([place])   Depression   GERD (gastroesophageal reflux disease)   Gait disorder   Stroke ([place])   Middle cerebral artery embolism, left   Metabolic encephalopathy, unknown etiology, resolving, POA  TIA able to be ruled out, CVA less likely Seizure unlikely  Questionable infectious process, unable to be ruled out  MRI remarkable for atrophy -was otherwise unremarkable for acute process EEG unremarkable for seizure-like activity Previous episode of leukocytosis although this appears to have resolved Hyperglycemia/hypoglycemia Continue baby aspirin, adjust insulin dosage Mental status improving daily - still not yet back to baseline - requiring 24h monitoring per PT  ?AKI vs CKD4 s/p renal transplant  Nephro  following - patient near baseline renal function  Tacrolimus level is normal limit Previously on hemodialysis until renal transplant 3 to 4 years ago. Continue prednisone and tacrolimus  SIRS criteria - fever, leukocytosis previously now resolved Cultures remain negative - without complaint No clear source thus far  Poorly controlled diabetes mellitus with hyperglycemia: ? History of running out of Lantus insulin - although this was reported when patient was altered  Diabetic counselor following -  transition to sliding scale insulin and long-acting  Nausea/vomiting: ? secondary to gastroparesis.   Now resolved. Tolerating PO  Chronic a-fib; rate controlled Rate controlled on metoprolol. ?previously on coumadin - unclear if history of contraindication  Hypertension:  Continue Norvasc and metoprolol  Hyperlipidemia:  Continue atorvastatin  DVT prophylaxis: Enoxaparin Code Status: Full code Family / Patient Communication:  Spoke with [name] at [phone] at length - patient still not back to baseline.   Disposition Plan: Possible transition to CIR - pending further eval   LOS: 5 days   Time spent: 35 minutes  Little Ishikawa, MD [facility] Pager [phone]  If 7PM-7AM, please contact night-coverage [email or web address] Password [facility] 11/02/2018, 2:51 PM

## 2018-11-02 NOTE — Progress Notes (Signed)
Physical Therapy Treatment Patient Details Name: Paul [name] MRN: [id number] DOB: 04-01-1956 Today's Date: 11/02/2018    History of Present Illness This 63 y.o. male admitted with AMS,  aphasia, Rt sided weakness, and Lt gaze preference.   CT of head showed no acute abnormality.  tPA was administered.   Pt was prepped for thrombectomy due to suspected MCA occlusion, but no occlusion was found.  MRI showed no acute abnormality.  Blood glucose was 790 on admission.  PMH includes:  IDDM, Bil. BKA (with prostheses), A-Fib, h/o duodenal adenocarcinoma s/p whipple procedure, Diabetic neuropathy, ESRD, s/p renal transplant, HTN     PT Comments    Pt pleasant and demonstrating increased mobility but remains limited by cognition. Pt normally independent and working and yet cannot figure out how to use his cell phone to access contacts and call wife. Pt was able to walk in hall with increased stability and ease and CIR would be appropriate for cognitive and functional training. Will continue to follow.     Follow Up Recommendations  CIR;Supervision/Assistance - 24 hour     Equipment Recommendations  None recommended by PT    Recommendations for Other Services       Precautions / Restrictions Precautions Precautions: Fall    Mobility  Bed Mobility Overal bed mobility: Needs Assistance Bed Mobility: Supine to Sit     Supine to sit: Min assist;HOB elevated     General bed mobility comments: HOB 20 degrees with need for tactile assist and cues to fully elevate trunk and scoot to EOB with increased time  Transfers     Transfers: Sit to/from Stand Sit to Stand: Min guard         General transfer comment: guarding for safety with cues for hand placement  Ambulation/Gait Ambulation/Gait assistance: Min guard Gait Distance (Feet): 400 Feet Assistive device: Rolling walker (2 wheeled) Gait Pattern/deviations: Step-through pattern;Decreased stride length     General Gait Details:  cues for posture and direction   Stairs             Wheelchair Mobility    Modified Rankin (Stroke Patients Only)       Balance Overall balance assessment: Mild deficits observed, not formally tested   Sitting balance-Leahy Scale: Good Sitting balance - Comments: able to sit EOB to don prostheses     Standing balance-Leahy Scale: Poor Standing balance comment: utilized bil UE support in standing                            Cognition Arousal/Alertness: Awake/alert Behavior During Therapy: Flat affect Overall Cognitive Status: Impaired/Different from baseline Area of Impairment: Memory;Problem solving                   Current Attention Level: Selective   Following Commands: Follows one step commands consistently     Problem Solving: Slow processing General Comments: pt able to don prostheses without cues. pt reports he cannot recall wife's phone number and that no numbers are stored in his phone. Assisted pt to open phone where multiple contacts are stored including wife. Pt able to state 3 c animals during gait but walking past room number despite being able to recall it      Exercises      General Comments        Pertinent Vitals/Pain Pain Assessment: No/denies pain    Home Living  Prior Function            PT Goals (current goals can now be found in the care plan section) Acute Rehab PT Goals Time For Goal Achievement: 11/16/18 Potential to Achieve Goals: Good Progress towards PT goals: Goals met and updated - see care plan    Frequency    Min 3X/week      PT Plan Current plan remains appropriate    Co-evaluation              AM-PAC PT "6 Clicks" Mobility   Outcome Measure  Help needed turning from your back to your side while in a flat bed without using bedrails?: A Little Help needed moving from lying on your back to sitting on the side of a flat bed without using bedrails?: A  Little Help needed moving to and from a bed to a chair (including a wheelchair)?: A Little Help needed standing up from a chair using your arms (e.g., wheelchair or bedside chair)?: A Little Help needed to walk in hospital room?: A Little Help needed climbing 3-5 steps with a railing? : A Little 6 Click Score: 18    End of Session Equipment Utilized During Treatment: Gait belt;Other (comment)(bil prostheses) Activity Tolerance: Patient tolerated treatment well Patient left: in chair;with call bell/phone within reach;with chair alarm set Nurse Communication: Mobility status PT Visit Diagnosis: Difficulty in walking, not elsewhere classified (R26.2);Unsteadiness on feet (R26.81)     Time: 6063-0160 PT Time Calculation (min) (ACUTE ONLY): 27 min  Charges:  $Gait Training: 8-22 mins $Therapeutic Activity: 8-22 mins                     [place], PT Acute Rehabilitation Services Pager: [phone] Office: [place] 11/02/2018, 11:46 AM

## 2018-11-02 NOTE — Progress Notes (Addendum)
Inpatient Diabetes Program Recommendations  AACE/ADA: New Consensus Statement on Inpatient Glycemic Control (2015)  Target Ranges:  Prepandial:   less than 140 mg/dL      Peak postprandial:   less than 180 mg/dL (1-2 hours)      Critically ill patients:  140 - 180 mg/dL   Lab Results  Component Value Date   GLUCAP 91 11/02/2018   HGBA1C 13.6 (H) 10/29/2018    Review of Glycemic Control Results for Paul [name], Paul [name] (MRN [id number]) as of 11/02/2018 10:24  Ref. Range 11/01/2018 17:32 11/01/2018 20:10 11/02/2018 01:16 11/02/2018 05:42  Glucose-Capillary Latest Ref Range: 70 - 99 mg/dL 272 (H) 319 (H) 225 (H) 91   Diabetes history:Type 1 DM per H&P Outpatient Diabetes medications: Basaglar 18 units daily Current orders for Inpatient glycemic control: Levemir 10 units q HS Novolog 1-3 units q 4 hours Inpatient Diabetes Program Recommendations:    Please consider d/c of Levemir and add Lantus 12 units q HS (patient takes [employer], [employer] at home).  Also please d/c q 4 hours Novolog correction.  Add Novolog sensitive correction tid with meals and HS scale.  Also please add Novolog 2 units tid with meals (hold if patient eats less than 50%).    Will see patient today regarding A1C.   Thanks  Adah Perl, RN, BC-ADM Inpatient Diabetes Coordinator Pager [phone] (8a-5p)   Addendum:  Spoke with patient regarding home DM management.  Patient admits that he has not been taking his insulin b/c he ran out.  He also states that he is not checking his blood sugars.  He is s/p whipple in 2002.  Discussed A1C with patient and gave him handout regarding A1C results.  Interestingly, A1C has increased from 7.9% to 13.6%. Per patient, he was only taking Basalgar daily and not rapid acting insulin due to low blood sugars.   Asked patient if I could speak with his wife and he stated yes. Called wife, [name] and discussed DM care.  She states that she was unaware that patient had run out of insulin and states  that "we could have easily gotten it" if she had known.  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  He does his own medications.  She states that she thinks she needs to help him with his medications and administer his insulin to make sure it is done.  In the past he was referred to psychologist at [facility] "regarding compliance", however this did not seem to help?  Discussed A1c results with patient and she was very concerned.  Agreed with her that patient likely does need assistance with medications.

## 2018-11-02 NOTE — Progress Notes (Signed)
Inpatient Rehabilitation Admissions Coordinator  I was contacted by RN CM, Hassan Rowan to assess if pt still in need of an inpt rehab admit. Pt is 400 feet min guard assist today. Pt no longer in need of an inpt rehab admit at this high functioning level.  Danne Baxter, RN, MSN Rehab Admissions Coordinator [phone] 11/02/2018 3:22 PM

## 2018-11-03 LAB — BASIC METABOLIC PANEL
Anion gap: 8 (ref 5–15)
BUN: 47 mg/dL — ABNORMAL HIGH (ref 8–23)
CO2: 19 mmol/L — ABNORMAL LOW (ref 22–32)
Calcium: 7.7 mg/dL — ABNORMAL LOW (ref 8.9–10.3)
Chloride: 110 mmol/L (ref 98–111)
Creatinine, Ser: 3.97 mg/dL — ABNORMAL HIGH (ref 0.61–1.24)
GFR calc Af Amer: 17 mL/min — ABNORMAL LOW (ref >60)
GFR calc non Af Amer: 15 mL/min — ABNORMAL LOW (ref >60)
Glucose, Bld: 274 mg/dL — ABNORMAL HIGH (ref 70–99)
Potassium: 4.1 mmol/L (ref 3.5–5.1)
Sodium: 137 mmol/L (ref 135–145)

## 2018-11-03 LAB — CBC
HCT: 28.9 % — ABNORMAL LOW (ref 39.0–52.0)
Hemoglobin: 8.8 g/dL — ABNORMAL LOW (ref 13.0–17.0)
MCH: 28.1 pg (ref 26.0–34.0)
MCHC: 30.4 g/dL (ref 30.0–36.0)
MCV: 92.3 fL (ref 80.0–100.0)
Platelets: 146 10*3/uL — ABNORMAL LOW (ref 150–400)
RBC: 3.13 MIL/uL — ABNORMAL LOW (ref 4.22–5.81)
RDW: 13.2 % (ref 11.5–15.5)
WBC: 6.9 10*3/uL (ref 4.0–10.5)
nRBC: 0 % (ref 0.0–0.2)

## 2018-11-03 LAB — CULTURE, BLOOD (ROUTINE X 2)
Culture: NO GROWTH
Culture: NO GROWTH
Special Requests: ADEQUATE

## 2018-11-03 LAB — GLUCOSE, CAPILLARY
Glucose-Capillary: 269 mg/dL — ABNORMAL HIGH (ref 70–99)
Glucose-Capillary: 195 mg/dL — ABNORMAL HIGH (ref 70–99)
Glucose-Capillary: 298 mg/dL — ABNORMAL HIGH (ref 70–99)
Glucose-Capillary: 337 mg/dL — ABNORMAL HIGH (ref 70–99)

## 2018-11-03 LAB — PHOSPHORUS: Phosphorus: 3.2 mg/dL (ref 2.5–4.6)

## 2018-11-03 MED ORDER — INSULIN GLARGINE 100 UNIT/ML ~~LOC~~ SOLN
15.0000 [IU] | Freq: Every day | SUBCUTANEOUS | Status: DC
  Administered 2018-11-03: 15 [IU] via SUBCUTANEOUS
  Filled 2018-11-03 (×2): qty 0.15

## 2018-11-03 NOTE — Progress Notes (Signed)
Inpatient Diabetes Program Recommendations  AACE/ADA: New Consensus Statement on Inpatient Glycemic Control (2015)  Target Ranges:  Prepandial:   less than 140 mg/dL      Peak postprandial:   less than 180 mg/dL (1-2 hours)      Critically ill patients:  140 - 180 mg/dL   Lab Results  Component Value Date   GLUCAP 269 (H) 11/03/2018   HGBA1C 13.6 (H) 10/29/2018    Review of Glycemic Control Results for ALEKSEY, NEWBERN (MRN [id number]) as of 11/03/2018 11:05  Ref. Range 11/02/2018 05:42 11/02/2018 11:25 11/02/2018 16:12 11/02/2018 21:29 11/03/2018 05:36  Glucose-Capillary Latest Ref Range: 70 - 99 mg/dL 91 216 (H) 383 (H) 315 (H) 269 (H)   Diabetes history:Type 1 DM per H&P Outpatient Diabetes medications: Basaglar 18 units daily Current orders for Inpatient glycemic control: Lantus 12 units q HS Novolog sensitive correction tid + hs 0-5 units  Inpatient Diabetes Program Recommendations:   Noted CBGs elevated post prandial CBGs yesterday and fasting increased to 269. -Lantus increase to 15 units -Novolog 2 units tid meal coverage if eats 50%  Thank you, Bethena Roys E. Lauralynn Loeb, RN, MSN, CDE  Diabetes Coordinator Inpatient Glycemic Control Team Team Pager [phone] (8am-5pm) 11/03/2018 11:08 AM

## 2018-11-03 NOTE — Progress Notes (Signed)
  Speech Language Pathology Treatment: Cognitive-Linquistic  Patient Details Name: Paul [name] MRN: [id number] DOB: [birth year] Today's Date: 11/03/2018 Time: 1209-1228 SLP Time Calculation (min) (ACUTE ONLY): 19 min  Assessment / Plan / Recommendation Clinical Impression  Pt demonstrates improving comprehension and expression compared to description in prior SLP visits. He is communicating at the conversational level, although still with word-finding errors and slow processing. He followed simple one-step commands and demonstrated confrontational naming with Mod I, but was only able to produce six words in one minute during divergent naming task. PTA pt was working full time, and he expressed concerns about being able to return to work due to his cognitive-linguistic difficulties. He specifally described difficulty remembering how to use his phone (as documented in PT note on previous date). Recommend CIR and 24/7 supervision upon discharge to address cognitive-linguistic function and safety.    HPI HPI: Pt is a 63 y.o. male with history of bilateral amputation above knee, hypertension, blood transfusion, end-stage renal disease, and currently on the kidney transplant recipient list.  He was admitted secondary to inability to speak and hemianopsia. CT of the head was negative for acute changes. MRI of the brain has not been completed as yet.       SLP Plan  Continue with current plan of care       Recommendations                   Follow up Recommendations: Inpatient Rehab SLP Visit Diagnosis: Aphasia (R47.01) Plan: Continue with current plan of care       [place] Valari Taylor 11/03/2018, 12:56 PM  Pollyann Glen, M.A. [place] Acute Environmental education officer [phone] Office [phone]

## 2018-11-03 NOTE — Progress Notes (Signed)
PROGRESS NOTE    Paul [name]  OIZ:[id number]  DOB: [birth year]  DOA: 10/28/2018 PCP: [name], MD  Brief Narrative: 63 year old male with history of hypertension, diabetes, depression, renal transplant, chronic kidney disease stage IV, calciphylaxis, anemia, atrial fibrillation not on chronic anticoagulation and who is status post bilateral BKA presented to the ED on June 10 with complaints of dysarthria/memory issues and underwent a stroke work-up after code stroke was called.  Patient was seen by neurology and underwent TPA subsequent to which he was monitored closely in the ICU.  Stroke work-up was essentially negative with unremarkable MRI except for age-related small vessel disease and cerebral atrophy.  EEG showed diffuse mild slowing with no seizure activity.  His symptoms supposedly improved and neurology felt his presenting symptoms were  likely physiologic in the setting of hyperglycemia (blood glucose greater than 700).  Patient states he ran out of Lantus insulin for couple of days prior to admission (takes 17 units Lantus daily).  Patient states he was on Coumadin in the past for A. fib but unclear why it was stopped.  He denies any history of GI bleeds. While in ICU, patient noted to spike fever of 102.2 F on June 11 and has slowly defervesced since then. Patient has been having rebounding hypo/hyperglycemic episodes intermittently.  Subjective: Patient continues to have episodes of fluctuating glucose levels over the past 72 hours-as 56, as high as 383.  Patient denies any acute complaints or issues, declines shortness of breath, chest pain, headache, fevers, chills, nausea, vomiting, diarrhea, constipation.  Remains somewhat sluggish with answers but improving-states he feels less confused and more clear than previously.  Objective: Vitals:   11/02/18 2002 11/03/18 0535 11/03/18 0922 11/03/18 1144  BP: 139/61 (!) 151/63 (!) 133/55 (!) 131/53  Pulse: (!) 55 (!) 51 99 (!) 47    Resp: 18 16  16   Temp: 98.2 F (36.8 C) 97.6 F (36.4 C)  97.8 F (36.6 C)  TempSrc: Oral Oral  Oral  SpO2: 100% 95% 100% 100%  Weight:  77 kg      Intake/Output Summary (Last 24 hours) at 11/03/2018 1150 Last data filed at 11/03/2018 1145 Gross per 24 hour  Intake 1251 ml  Output 1150 ml  Net 101 ml   Filed Weights   11/01/18 0453 11/02/18 0607 11/03/18 0535  Weight: 73.8 kg 75.4 kg 77 kg    Physical Examination:  General:  Pleasantly resting in bed, No acute distress.  Nontoxic in appearance, alert to person and place -somewhat sluggish with responses but improving since yesterday HEENT:  Normocephalic atraumatic.  Sclerae nonicteric, noninjected.  Extraocular movements intact bilaterally. Neck:  Without mass or deformity.  Trachea is midline. Lungs:  Clear to auscultate bilaterally without rhonchi, wheeze, or rales. Heart:  Regular rate and rhythm.  Without murmurs, rubs, or gallops. Abdomen:  Soft, nontender, nondistended.  Without guarding or rebound. Extremities: Bilateral BKA Skin:  Warm and dry, no erythema, no ulcerations.  Data Reviewed: I have personally reviewed following labs and imaging studies  CBC: Recent Labs  Lab 10/28/18 1327 10/29/18 0530 11/02/18 0602 11/03/18 0614  WBC 9.4 16.1* 9.0 6.9  NEUTROABS 7.1 13.8*  --   --   HGB 9.9* 9.5* 8.8* 8.8*  HCT 30.8* 29.8* 28.6* 28.9*  MCV 87.5 87.1 91.4 92.3  PLT 123* 140* 129* 338*   Basic Metabolic Panel: Recent Labs  Lab 10/28/18 2142 10/29/18 0530 10/31/18 0533 11/01/18 0530 11/02/18 0602 11/03/18 0614  NA 134* 133* 140  140 139 137  K 3.1* 3.6 3.5 3.5 3.6 4.1  CL 105 103 111 110 110 110  CO2 17* 20* 21* 21* 21* 19*  GLUCOSE 182* 198* 112* 74 99 274*  BUN 34* 30* 28* 25* 37* 47*  CREATININE 3.15* 3.30* 3.79* 3.63* 4.10* 3.97*  CALCIUM 8.9 9.0 7.8* 8.0* 7.5* 7.7*  MG 1.1* 1.6* 2.3  --   --   --   PHOS  --   --   --   --   --  3.2   GFR: Estimated Creatinine Clearance: 20.2 mL/min (A) (by  C-G formula based on SCr of 3.97 mg/dL (H)). Liver Function Tests: Recent Labs  Lab 10/28/18 1327  AST 11*  ALT 12  ALKPHOS 57  BILITOT 0.9  PROT 5.3*  ALBUMIN 3.3*   Recent Labs  Lab 10/28/18 1830  LIPASE 23  AMYLASE 28   No results for input(s): AMMONIA in the last 168 hours. Coagulation Profile: Recent Labs  Lab 10/28/18 1327  INR 1.2   Cardiac Enzymes: No results for input(s): CKTOTAL, CKMB, CKMBINDEX, TROPONINI in the last 168 hours. BNP (last 3 results) No results for input(s): PROBNP in the last 8760 hours. HbA1C: No results for input(s): HGBA1C in the last 72 hours. CBG: Recent Labs  Lab 11/02/18 1125 11/02/18 1612 11/02/18 2129 11/03/18 0536 11/03/18 1143  GLUCAP 216* 383* 315* 269* 195*   Lipid Profile: No results for input(s): CHOL, HDL, LDLCALC, TRIG, CHOLHDL, LDLDIRECT in the last 72 hours. Thyroid Function Tests: No results for input(s): TSH, T4TOTAL, FREET4, T3FREE, THYROIDAB in the last 72 hours. Anemia Panel: No results for input(s): VITAMINB12, FOLATE, FERRITIN, TIBC, IRON, RETICCTPCT in the last 72 hours. Sepsis Labs: No results for input(s): PROCALCITON, LATICACIDVEN in the last 168 hours.  Recent Results (from the past 240 hour(s))  SARS Coronavirus 2     Status: None   Collection Time: 10/28/18  1:51 PM  Result Value Ref Range Status   SARS Coronavirus 2 NOT DETECTED NOT DETECTED Final    Comment: (NOTE) SARS-CoV-2 target nucleic acids are NOT DETECTED. The SARS-CoV-2 RNA is generally detectable in upper and lower respiratory specimens during the acute phase of infection.  Negative  results do not preclude SARS-CoV-2 infection, do not rule out co-infections with other pathogens, and should not be used as the sole basis for treatment or other patient management decisions.  Negative results must be combined with clinical observations, patient history, and epidemiological information. The expected result is Not Detected. Fact Sheet  for Patients: http://www.biofiredefense.com/wp-content/uploads/2020/03/BIOFIRE-COVID -19-patients.pdf Fact Sheet for Healthcare Providers: http://www.biofiredefense.com/wp-content/uploads/2020/03/BIOFIRE-COVID -19-hcp.pdf This test is not yet approved or cleared by the [place] and  has been authorized for detection and/or diagnosis of SARS-CoV-2 by FDA under an Emergency Use Authorization (EUA).  This EUA will remain in effec t (meaning this test can be used) for the duration of  the COVID-19 declaration under Section 564(b)(1) of the Act, 21 U.S.C. section 360bbb-3(b)(1), unless the authorization is terminated or revoked sooner. Performed at [facility], [place] [address] [place], [place] [address]   MRSA PCR Screening     Status: None   Collection Time: 10/28/18  6:15 PM   Specimen: Nasal Mucosa; Nasopharyngeal  Result Value Ref Range Status   MRSA by PCR NEGATIVE NEGATIVE Final    Comment:        The GeneXpert MRSA Assay (FDA approved for NASAL specimens only), is one component of a comprehensive MRSA colonization surveillance program. It  is not intended to diagnose MRSA infection nor to guide or monitor treatment for MRSA infections. Performed at [facility], [place] [address] [place], [place] [address]   Culture, blood (routine x 2)     Status: None (Preliminary result)   Collection Time: 10/29/18  4:56 PM   Specimen: BLOOD  Result Value Ref Range Status   Specimen Description BLOOD LEFT ANTECUBITAL  Final   Special Requests   Final    BOTTLES DRAWN AEROBIC ONLY Blood Culture adequate volume   Culture   Final    NO GROWTH 3 DAYS Performed at [facility], [place] [address], [place], [place] [address]    Report Status PENDING  Incomplete  Culture, blood (routine x 2)     Status: None (Preliminary result)   Collection Time: 10/29/18  6:45 PM   Specimen: BLOOD  Result Value Ref Range Status   Specimen Description BLOOD LEFT ANTECUBITAL  Final    Special Requests   Final    BOTTLES DRAWN AEROBIC ONLY Blood Culture results may not be optimal due to an inadequate volume of blood received in culture bottles   Culture   Final    NO GROWTH 3 DAYS Performed at [facility], [place] [address], [place], [place] [address]    Report Status PENDING  Incomplete    Radiology Studies: No results found.  Scheduled Meds:  amLODipine  10 mg Oral Daily   aspirin EC  81 mg Oral Daily   atorvastatin  10 mg Oral Daily   calcitRIOL  0.5 mcg Oral Daily   calcium acetate  1,334 mg Oral TID WC   enoxaparin (LOVENOX) injection  30 mg Subcutaneous Q24H   insulin aspart  0-9 Units Subcutaneous TID WC   insulin glargine  12 Units Subcutaneous QHS   mouth rinse  15 mL Mouth Rinse BID   metoprolol tartrate  50 mg Oral BID   mycophenolate  360 mg Oral BID   predniSONE  5 mg Oral Q breakfast   sodium bicarbonate  1,950 mg Oral QID   sodium chloride flush  3 mL Intravenous Once   tacrolimus  4 mg Oral Q24H   And   tacrolimus  3 mg Oral Q24H   Continuous Infusions:  dextrose 5 % and 0.9% NaCl 10 mL/hr at 10/31/18 1600    Assessment & Plan:    Hospital Problems: Principal Problem:   Acute metabolic encephalopathy Active Problems:   ESRD on hemodialysis ([place])   Anemia   DM (diabetes mellitus) ([place])   Depression   GERD (gastroesophageal reflux disease)   Gait disorder   Stroke ([place])   Middle cerebral artery embolism, left   Metabolic encephalopathy, unknown etiology, resolving, POA  TIA unable to be ruled out, CVA less likely given imaging Seizure/postictal state unlikely given EEG Questionable infectious process, unable to be ruled out  MRI remarkable for atrophy -was otherwise unremarkable for acute process EEG unremarkable for seizure-like activity; less likely post-ictal state given longevity of mental status change Previous episode of leukocytosis has resolved Hyperglycemia/hypoglycemia Continue baby aspirin,  adjust insulin dosage Mental status improving daily - still not yet back to baseline - requiring 24h monitoring per PT - possible DC in the next 24-48h pending ongoing eval and discussion with family availability  Poorly controlled diabetes mellitus with hyperglycemia: ? History of running out of Lantus insulin - although this was reported when patient was altered A1C 13.6 - extremely poor glucose control Increase long  acting insulin to 15 - continue sliding scale; titrate as necessary  ?AKI vs CKD4 s/p renal transplant  Nephro following - patient near baseline renal function Tacrolimus level is normal limit Previously on hemodialysis until renal transplant 3 to 4 years ago. Continue prednisone and tacrolimus  SIRS criteria - fever, leukocytosis previously now resolved Cultures remain negative - without complaint No clear source thus far  Nausea/vomiting, resolved ? secondary to gastroparesis.   Now resolved. Tolerating PO well without issues  Chronic a-fib; rate controlled Rate controlled on metoprolol. ?previously on coumadin - unclear if history of contraindication but no longer on per outpatient physician  Hypertension:  Continue Norvasc and metoprolol  Hyperlipidemia:  Continue atorvastatin  DVT prophylaxis: Enoxaparin Code Status: Full code Family / Patient Communication:  Spoke with [name] at [phone] at length - patient still not back to baseline - possible DC home with 24h supervision if wife agreeable in the next 24-48 hours; CIR unable to accept  Disposition Plan: Pending discussion with wife/case/PT - may be able to DC home with 24h supervision in the next 24-48h pending clinical improvement.   LOS: 6 days   Time spent: 35 minutes  Little Ishikawa, MD [facility] Pager [phone]  If 7PM-7AM, please contact night-coverage [email or web address] Password [facility] 11/03/2018, 11:50 AM

## 2018-11-03 NOTE — Progress Notes (Signed)
Occupational Therapy Treatment Patient Details Name: Paul [name] MRN: [id number] DOB: 01-17-1956 Today's Date: 11/03/2018    History of present illness This 63 y.o. male admitted with AMS,  aphasia, Rt sided weakness, and Lt gaze preference.   CT of head showed no acute abnormality.  tPA was administered.   Pt was prepped for thrombectomy due to suspected MCA occlusion, but no occlusion was found.  MRI showed no acute abnormality.  Blood glucose was 790 on admission.  PMH includes:  IDDM, Bil. BKA (with prostheses), A-Fib, h/o duodenal adenocarcinoma s/p whipple procedure, Diabetic neuropathy, ESRD, s/p renal transplant, HTN    OT comments  Pt is making steady progress and improvements.  Cognition is much improved, but he still has significant deficit - he was lying in a bed saturated with urine, and made no attempt to contact nsg for assist (I am not sure he was aware of this).  He required min cues to operate his phone, and is slow to process info.  He states he wants to rehab himself and does not want follow up therapies.  Also discussed with him a safe trajectory for return to work, however, he states he will stay out of work for a couple of days.  Phoned wife and updated her on his progress as well as need for supervision at home due to impaired cognition, and the need for follow up OT, PT, SLP - she prefers neuro rehab as they are familiar with the center.  Will follow acutely.   Follow Up Recommendations  Supervision/Assistance - 24 hour;Outpatient OT    Equipment Recommendations  None recommended by OT    Recommendations for Other Services Rehab consult    Precautions / Restrictions Precautions Precautions: Fall       Mobility Bed Mobility Overal bed mobility: Needs Assistance Bed Mobility: Supine to Sit     Supine to sit: Supervision        Transfers Overall transfer level: Needs assistance Equipment used: Rolling walker (2 wheeled) Transfers: Sit to/from [employer] Sit to Stand: Min guard Stand pivot transfers: Min guard       General transfer comment: min guard for safety     Balance Overall balance assessment: Mild deficits observed, not formally tested Sitting-balance support: Feet supported;Feet unsupported Sitting balance-Leahy Scale: Good     Standing balance support: Bilateral upper extremity supported Standing balance-Leahy Scale: Poor Standing balance comment: utilized bil UE support in standing                           ADL either performed or assessed with clinical judgement   ADL Overall ADL's : Needs assistance/impaired                     Lower Body Dressing: Min guard;Sit to/from stand   Toilet Transfer: Min guard;Ambulation;Comfort height toilet;Grab bars;RW   Toileting- Water quality scientist and Hygiene: Min guard;Sit to/from stand       Functional mobility during ADLs: Min guard;Rolling walker       Vision   Additional Comments: Pt denies visual changes    Perception     Praxis      Cognition Arousal/Alertness: Awake/alert Behavior During Therapy: Flat affect Overall Cognitive Status: Impaired/Different from baseline Area of Impairment: Attention;Memory;Safety/judgement;Awareness;Problem solving                   Current Attention Level: Selective;Alternating Memory: Decreased short-term memory Following Commands: Follows  one step commands consistently;Follows multi-step commands inconsistently;Follows multi-step commands with increased time Safety/Judgement: Decreased awareness of safety;Decreased awareness of deficits Awareness: Intellectual;Emergent Problem Solving: Slow processing;Difficulty sequencing;Requires verbal cues;Requires tactile cues General Comments: Pt more interactive and smiling more.  He requires min cues to use his phone.  He is beginning to demonstrate emerging awareness of his deficits.  He was lying in a bed wet with urine, and made no  attempt to contact RN, or to correct situation         Exercises Exercises: Other exercises Other Exercises Other Exercises: discussed with pt recommendations that he have OP or HH OT, PT, SLP (he does not want this), as well as that he should not attempt to work for at least 2 weeks, and work at a reduced schedule.  Optimally would wait until he is cleared by follow up OT/SLP, but pt currentl refusing this    Shoulder Instructions       General Comments spoke with wife re: need for supervision at discharge due to cognition, that I recommend he not consider return to work for at least 2 weeks (if cognition clears), and recommendation for follow up OT,PT, SLP (pt reports he is not interested in follow up therapies)    Pertinent Vitals/ Pain       Pain Assessment: No/denies pain Faces Pain Scale: No hurt  Home Living                                          Prior Functioning/Environment              Frequency  Min 3X/week        Progress Toward Goals  OT Goals(current goals can now be found in the care plan section)  Progress towards OT goals: Progressing toward goals     Plan Discharge plan needs to be updated    Co-evaluation                 AM-PAC OT "6 Clicks" Daily Activity     Outcome Measure   Help from another person eating meals?: None Help from another person taking care of personal grooming?: A Little Help from another person toileting, which includes using toliet, bedpan, or urinal?: A Little Help from another person bathing (including washing, rinsing, drying)?: A Little Help from another person to put on and taking off regular upper body clothing?: None Help from another person to put on and taking off regular lower body clothing?: A Little 6 Click Score: 20    End of Session Equipment Utilized During Treatment: Gait belt;Rolling walker  OT Visit Diagnosis: Unsteadiness on feet (R26.81);Cognitive communication deficit  (R41.841) Symptoms and signs involving cognitive functions: Cerebral infarction   Activity Tolerance Patient tolerated treatment well   Patient Left in chair;with call bell/phone within reach;with chair alarm set   Nurse Communication Mobility status        Time: 4827-0786 OT Time Calculation (min): 33 min  Charges: OT General Charges $OT Visit: 1 Visit OT Treatments $Self Care/Home Management : 23-37 mins  Lucille Passy, OTR/L [place] Pager [phone] Office [phone]    Lucille Passy M 11/03/2018, 3:36 PM

## 2018-11-04 LAB — BASIC METABOLIC PANEL
Anion gap: 7 (ref 5–15)
BUN: 53 mg/dL — ABNORMAL HIGH (ref 8–23)
CO2: 23 mmol/L (ref 22–32)
Calcium: 8 mg/dL — ABNORMAL LOW (ref 8.9–10.3)
Chloride: 107 mmol/L (ref 98–111)
Creatinine, Ser: 3.68 mg/dL — ABNORMAL HIGH (ref 0.61–1.24)
GFR calc Af Amer: 19 mL/min — ABNORMAL LOW (ref >60)
GFR calc non Af Amer: 16 mL/min — ABNORMAL LOW (ref >60)
Glucose, Bld: 256 mg/dL — ABNORMAL HIGH (ref 70–99)
Potassium: 4.4 mmol/L (ref 3.5–5.1)
Sodium: 137 mmol/L (ref 135–145)

## 2018-11-04 LAB — GLUCOSE, CAPILLARY
Glucose-Capillary: 234 mg/dL — ABNORMAL HIGH (ref 70–99)
Glucose-Capillary: 253 mg/dL — ABNORMAL HIGH (ref 70–99)
Glucose-Capillary: 251 mg/dL — ABNORMAL HIGH (ref 70–99)

## 2018-11-04 LAB — CBC
HCT: 28.8 % — ABNORMAL LOW (ref 39.0–52.0)
Hemoglobin: 8.7 g/dL — ABNORMAL LOW (ref 13.0–17.0)
MCH: 27.4 pg (ref 26.0–34.0)
MCHC: 30.2 g/dL (ref 30.0–36.0)
MCV: 90.9 fL (ref 80.0–100.0)
Platelets: 153 10*3/uL (ref 150–400)
RBC: 3.17 MIL/uL — ABNORMAL LOW (ref 4.22–5.81)
RDW: 13.1 % (ref 11.5–15.5)
WBC: 6.2 10*3/uL (ref 4.0–10.5)
nRBC: 0 % (ref 0.0–0.2)

## 2018-11-04 LAB — PHOSPHORUS: Phosphorus: 2.7 mg/dL (ref 2.5–4.6)

## 2018-11-04 MED ORDER — INSULIN GLARGINE 100 UNIT/ML ~~LOC~~ SOLN
10.0000 [IU] | Freq: Two times a day (BID) | SUBCUTANEOUS | Status: DC
  Administered 2018-11-04: 10 [IU] via SUBCUTANEOUS
  Filled 2018-11-04 (×2): qty 0.1

## 2018-11-04 MED ORDER — METOPROLOL TARTRATE 50 MG PO TABS: 50.0000 mg | ORAL_TABLET | Freq: Two times a day (BID) | ORAL | 0 refills | Status: DC

## 2018-11-04 MED ORDER — INSULIN GLARGINE 100 UNIT/ML ~~LOC~~ SOLN: 10.0000 [IU] | Freq: Two times a day (BID) | SUBCUTANEOUS | 11 refills | Status: DC

## 2018-11-04 NOTE — Progress Notes (Signed)
  Speech Language Pathology Treatment: Cognitive-Linquistic  Patient Details Name: Paul [name] MRN: [id number] DOB: [birth year] Today's Date: 11/04/2018 Time: 1432-1450 SLP Time Calculation (min) (ACUTE ONLY): 18 min  Assessment / Plan / Recommendation Clinical Impression  Pt's language abilities continue to improve and has met receptive and expressive language goals set on initial eval. In conversation he has intermittent hesitations, one phoneme substitution noted and dysgraphia. Demonstrated letter substitution during generative sentence writing x 2 without awareness. He manages his medications and wife is responsible for finances. Pt discharging home today. Recommend he have Speech therapy evaluation with home health.   HPI HPI: Pt is a 63 y.o. male with history of bilateral amputation above knee, hypertension, blood transfusion, end-stage renal disease, and currently on the kidney transplant recipient list.  He was admitted secondary to inability to speak and hemianopsia. CT of the head was negative for acute changes. MRI of the brain has not been completed as yet.       SLP Plan  All goals met       Recommendations                   Follow up Recommendations: Home health SLP SLP Visit Diagnosis: Aphasia (R47.01) Plan: All goals met       GO                [name] 11/04/2018, 2:55 PM  [name] Caroli.Ed Risk analyst [phone] Office [phone]

## 2018-11-04 NOTE — TOC Transition Note (Addendum)
Transition of Care [facility]) - CM/SW Discharge Note   Patient Details  Name: Paul [name] MRN: [id number] Date of Birth: 11/20/1955  [facility] CM/SW Contact:  Bethena Roys, RN Phone Number: 11/04/2018, 3:23 PM   Clinical Narrative: Pt presented for Acute metabolic encephalopathy- Plan for transition home-Pt has [employer] primary and secondary Medicare. CM did have to call [employer] for [place] and DME company. DME [place] Rile will be delivered via [employer] to the patient's room prior to transition home. [facility] unable to provide me with agency name at this time. They will call case manager back with information. ID intake # is [id number]. Orders have ben faxed to [place] call back regarding agency. No further needs from CM at this time.   1634 11-04-18 [place] 11-06-18 No agency found for patient at this time. [employer] [facility] to reach out to the patient at home to get agency name and contact information.    1710 plan for transition home 11-04-18. Pt and MD aware that he will not have [place] assigned at this time. Pt and MD ok with this he will have RW delivered by 6:00 pm if not patient will leave the hospital.    Final next level of care: [place] Barriers to Discharge: No Barriers Identified   Patient Goals and CMS Choice Patient states their goals for this hospitalization and ongoing recovery are:: "to get back home" CMS Medicare.gov Compare Post Acute Care list provided to:: Patient Choice offered to / list presented to : Patient([employer] Primary -Medicare secondary)  Discharge Placement                       Discharge Plan and Services In-house Referral: NA Discharge Planning Services: CM Consult Post Acute Care Choice: Home Health, Durable Medical Equipment          DME Arranged: Walker rolling DME Agency: [place] Date DME Agency Contacted: 11/04/18 Time DME Agency Contacted: [phone] Representative spoke with at  DME Agency: [place]: RN, Disease Management, PT       Representative spoke with at [place] at [place] to call with agency name  Social Determinants of Health (SDOH) Interventions     Readmission Risk Interventions Readmission Risk Prevention Plan 11/04/2018  Transportation Screening Complete  Medication Review Press photographer) Complete  PCP or Specialist appointment within 3-5 days of discharge Complete  HRI or [place] Complete  SW Recovery Care/Counseling Consult Complete  [place] Not Applicable  Some recent data might be hidden

## 2018-11-04 NOTE — Progress Notes (Signed)
Occupational Therapy Treatment Patient Details Name: Paul [name] MRN: [id number] DOB: [birth year] Today's Date: 11/04/2018    History of present illness This 63 y.o. male admitted with AMS,  aphasia, Rt sided weakness, and Lt gaze preference.   CT of head showed no acute abnormality.  tPA was administered.   Pt was prepped for thrombectomy due to suspected MCA occlusion, but no occlusion was found.  MRI showed no acute abnormality.  Blood glucose was 790 on admission.  PMH includes:  IDDM, Bil. BKA (with prostheses), A-Fib, h/o duodenal adenocarcinoma s/p whipple procedure, Diabetic neuropathy, ESRD, s/p renal transplant, HTN    OT comments  Patient agreeable to OT, reports plan to dc home today.  Completed LB dressing with prostheses with setup assist, min guard sit<>stand but no cueing to problem solve technique/steps. Simulated shower transfers with min guard using RW, reports doffing prostheses once sitting on bench then re-donning once finished with shower.  Patient completed pill box test with 1 error, placed all pills correctly but took 8 minutes to complete; min cueing to problem solve opening pill bottle but once educated able to complete remaining bottles without assist.  Pt will have 24/7 support from spouse, and agreeable to recommendations of return to work and driving.  Continue to recommend OP OT.    Follow Up Recommendations  Supervision/Assistance - 24 hour;Outpatient OT    Equipment Recommendations  None recommended by OT    Recommendations for Other Services      Precautions / Restrictions Precautions Precautions: Fall Restrictions Weight Bearing Restrictions: No       Mobility Bed Mobility Overal bed mobility: Needs Assistance Bed Mobility: Supine to Sit     Supine to sit: Supervision     General bed mobility comments: supervision for safety, no assist required  Transfers Overall transfer level: Needs assistance Equipment used: Rolling walker (2  wheeled) Transfers: Sit to/from Stand Sit to Stand: Min guard         General transfer comment: min guard for safety    Balance Overall balance assessment: Needs assistance Sitting-balance support: Feet supported;Feet unsupported Sitting balance-Leahy Scale: Good     Standing balance support: Bilateral upper extremity supported;During functional activity Standing balance-Leahy Scale: Poor Standing balance comment: utilized bil UE support in standing                           ADL either performed or assessed with clinical judgement   ADL Overall ADL's : Needs assistance/impaired                     Lower Body Dressing: Min guard;Sit to/from stand Lower Body Dressing Details (indicate cue type and reason): able to don prostheses at EOB and stand with min guard         Tub/ Shower Transfer: Walk-in shower;Min guard;Ambulation;Tub bench;Rolling walker Tub/Shower Transfer Details (indicate cue type and reason): simulated shower transfers using "bench" but demostrates technique he uses at home with min guard for safety  Functional mobility during ADLs: Min guard;Rolling walker General ADL Comments: donning prostheses without cueing, setup only     Vision       Perception     Praxis      Cognition Arousal/Alertness: Awake/alert Behavior During Therapy: Flat affect Overall Cognitive Status: Impaired/Different from baseline Area of Impairment: Memory;Attention;Safety/judgement                   Current Attention Level: Alternating Memory:  Decreased short-term memory Following Commands: Follows one step commands consistently;Follows multi-step commands with increased time Safety/Judgement: Decreased awareness of deficits;Decreased awareness of safety   Problem Solving: Slow processing General Comments: improving awareness to deficits and needs at dc; pill box test completed see below for details    Functional cognition further assessed with  The Pillbox Test: A Measure of Executive Functioning and Estimate of Medication Management. A straight pass/fail designation is determined by 3 or more errors of omission or misplacement on the task. The pt completed the test with less than 3 errors. 1 error with time (taking 8 minutes, recommended 5 minutes to complete).       Exercises     Shoulder Instructions       General Comments reviewed recommendations (per OT yesterday) and pt agreeable     Pertinent Vitals/ Pain       Pain Assessment: No/denies pain  Home Living                                          Prior Functioning/Environment              Frequency  Min 3X/week        Progress Toward Goals  OT Goals(current goals can now be found in the care plan section)  Progress towards OT goals: Progressing toward goals  Acute Rehab OT Goals Patient Stated Goal: home today OT Goal Formulation: With patient  Plan Discharge plan remains appropriate;Frequency remains appropriate    Co-evaluation                 AM-PAC OT "6 Clicks" Daily Activity     Outcome Measure   Help from another person eating meals?: None Help from another person taking care of personal grooming?: A Little Help from another person toileting, which includes using toliet, bedpan, or urinal?: A Little Help from another person bathing (including washing, rinsing, drying)?: A Little Help from another person to put on and taking off regular upper body clothing?: None Help from another person to put on and taking off regular lower body clothing?: A Little 6 Click Score: 20    End of Session Equipment Utilized During Treatment: Gait belt;Rolling walker;Other (comment)(bil prostheses)  OT Visit Diagnosis: Unsteadiness on feet (R26.81);Cognitive communication deficit (R41.841)   Activity Tolerance Patient tolerated treatment well   Patient Left in chair;with call bell/phone within reach;with chair alarm set   Nurse  Communication Mobility status        Time: 1112-1140 OT Time Calculation (min): 28 min  Charges: OT General Charges $OT Visit: 1 Visit OT Treatments $Self Care/Home Management : 8-22 mins $Cognitive Funtion inital: Initial 15 mins  Delight Stare, [place] Pager [phone] Office [phone]    Delight Stare 11/04/2018, 1:36 PM

## 2018-11-04 NOTE — Progress Notes (Signed)
Patient transported home with wife walker will be delivered to the house.

## 2018-11-04 NOTE — Progress Notes (Signed)
Physical Therapy Treatment Patient Details Name: Paul [name] MRN: [id number] DOB: 01-12-1956 Today's Date: 11/04/2018    History of Present Illness This 63 y.o. male admitted with AMS,  aphasia, Rt sided weakness, and Lt gaze preference.   CT of head showed no acute abnormality.  tPA was administered.   Pt was prepped for thrombectomy due to suspected MCA occlusion, but no occlusion was found.  MRI showed no acute abnormality.  Blood glucose was 790 on admission.  PMH includes:  IDDM, Bil. BKA (with prostheses), A-Fib, h/o duodenal adenocarcinoma s/p whipple procedure, Diabetic neuropathy, ESRD, s/p renal transplant, HTN     PT Comments    Focus of therapy stair training as den where he spends most of his time is down 2 steps. Pt is sitting up in recliner with prosthetics donned on entry. Pt is limited in safe mobility by decreased safety awareness and decreased strength R>L. Pt is min guard for transfers and ambulation with RW, however requires min Ax2  for ascent/descent of 5 steps with rail on R. Pt states that he uses his cane and the mantle of the fireplace at home. Pt educated to have wife present when he is navigating steps and to use urinal when in den so that he does not urgently have to go up step to bathroom. Pt hopeful to d/c home today.    Follow Up Recommendations  (P) Home health PT;Supervision for mobility/OOB     Equipment Recommendations  (P) None recommended by PT    Recommendations for Other Services       Precautions / Restrictions Precautions Precautions: Fall Restrictions Weight Bearing Restrictions: No    Mobility  Bed Mobility  OOB on entry  Transfers Overall transfer level: Needs assistance Equipment used: Rolling walker (2 wheeled) Transfers: Sit to/from Stand Sit to Stand: Min guard         General transfer comment: min guard for safety  Ambulation/Gait Ambulation/Gait assistance: (P) Min guard Gait Distance (Feet): (P) 125 Feet Assistive  device: (P) Rolling walker (2 wheeled) Gait Pattern/deviations: (P) Step-through pattern;Decreased stride length Gait velocity: (P) slowed Gait velocity interpretation: (P) 1.31 - 2.62 ft/sec, indicative of limited community ambulator General Gait Details: (P) min guard for safety    Stairs Stairs: (P) Yes Stairs assistance: (P) Min assist;+2 physical assistance Stair Management: (P) One rail Left;Forwards Number of Stairs: (P) 5 General stair comments: (P) minA for steadying with ascent/descent, vc for foot placement and sequencing       Balance Overall balance assessment: Needs assistance Sitting-balance support: Feet supported;Feet unsupported Sitting balance-Leahy Scale: Good     Standing balance support: Bilateral upper extremity supported;During functional activity Standing balance-Leahy Scale: Poor Standing balance comment: utilized bil UE support in standing                            Cognition Arousal/Alertness: Awake/alert Behavior During Therapy: Flat affect Overall Cognitive Status: Impaired/Different from baseline Area of Impairment: Memory;Attention;Safety/judgement                   Current Attention Level: Alternating Memory: Decreased short-term memory Following Commands: Follows one step commands consistently;Follows multi-step commands with increased time Safety/Judgement: Decreased awareness of deficits;Decreased awareness of safety   Problem Solving: Slow processing General Comments: improving awareness to deficits and needs at dc; pill box test completed see below for details       Exercises      General Comments General  comments (skin integrity, edema, etc.): reviewed recommendations (per OT yesterday) and pt agreeable       Pertinent Vitals/Pain Pain Assessment: No/denies pain           PT Goals (current goals can now be found in the care plan section) Acute Rehab PT Goals Patient Stated Goal: home today PT Goal  Formulation: (P) With patient Time For Goal Achievement: (P) 11/16/18 Potential to Achieve Goals: (P) Good Progress towards PT goals: (P) Progressing toward goals    Frequency    (P) Min 3X/week      PT Plan (P) Discharge plan needs to be updated       AM-PAC PT "6 Clicks" Mobility   Outcome Measure  Help needed turning from your back to your side while in a flat bed without using bedrails?: (P) None Help needed moving from lying on your back to sitting on the side of a flat bed without using bedrails?: (P) None Help needed moving to and from a bed to a chair (including a wheelchair)?: (P) None Help needed standing up from a chair using your arms (e.g., wheelchair or bedside chair)?: (P) None Help needed to walk in hospital room?: (P) None Help needed climbing 3-5 steps with a railing? : (P) A Little 6 Click Score: (P) 23    End of Session Equipment Utilized During Treatment: (P) Gait belt;Other (comment)(bilateral prosthesis) Activity Tolerance: (P) Patient tolerated treatment well Patient left: (P) in chair;with call bell/phone within reach;with chair alarm set Nurse Communication: (P) Mobility status PT Visit Diagnosis: (P) Difficulty in walking, not elsewhere classified (R26.2);Unsteadiness on feet (R26.81)     Time: (P) 1152-(P) 1207 PT Time Calculation (min) (ACUTE ONLY): (P) 15 min  Charges:  $Gait Training: (P) 8-22 mins                     Providencia Hottenstein B. Migdalia Dk PT, DPT Acute Rehabilitation Services Pager [phone] Office [phone]    [place] 11/04/2018, 1:46 PM

## 2018-11-04 NOTE — Discharge Summary (Signed)
Physician Discharge Summary  Paul [name] EXN:[id number] DOB: 1955-08-30 DOA: 10/28/2018  PCP: Paul Amel, MD  Admit date: 10/28/2018 Discharge date: 11/04/2018  Admitted From: Home Disposition: Home  Recommendations for Outpatient Follow-up:  1. Follow up with PCP in 1-2 weeks 2. Please obtain BMP/CBC in one week  Home Health: Home health, 24-hour supervision Equipment/Devices: None  Discharge Condition: Stable CODE STATUS: Full Diet recommendation: Diabetic diet  Brief/Interim Summary: 63 year old male with history of hypertension, diabetes, depression, renal transplant, chronic kidney disease stage IV, calciphylaxis, anemia, atrial fibrillation not on chronic anticoagulation and who is status post bilateral BKA presented to the ED on June 10 with complaints of dysarthria/memory issues and underwent a stroke work-up after code stroke was called.  Patient was seen by neurology and underwent TPA subsequent to which he was monitored closely in the ICU.  Stroke work-up was essentially negative with unremarkable MRI except for age-related small vessel disease and cerebral atrophy.  EEG showed diffuse mild slowing with no seizure activity.  His symptoms supposedly improved and neurology felt his presenting symptoms were  likely physiologic in the setting of hyperglycemia (blood glucose greater than 700).  Patient states he ran out of Lantus insulin for couple of days prior to admission (takes 17 units Lantus daily).  Patient states he was on Coumadin in the past for A. fib but unclear why it was stopped.  He denies any history of GI bleeds. While in ICU, patient noted to spike fever of 102.2 F on June 11 and has slowly defervesced since then. Patient has been having rebounding hypo/hyperglycemic episodes intermittently.  As above for essentially acute metabolic encephalopathy of unknown origin.  Patient was known well previous to June 10 but admitted with dysarthria, memory issues with negative  stroke work-up, negative EEG for seizure rule out, transient episode of fever and leukocytosis although this resolved with no clinical episodes or complaints of infectious process.  Does have a known history of ESRD status post renal transplant currently on tacrolimus and prednisone -tacrolimus level was within normal limits.  Nephrology consulted and assistance given concern for pharmacy however patient's creatinine remained normal and given negative levels as above nephrology signed off given patient's symptoms appear to be resolving.  At this time patient has been cleared by PT, initially evaluated for inpatient rehab given occultly with ambulation and cognitive deficits however patient now ANO x4 although somewhat sluggish compared to baseline per wife patient is much more improved from admission.  Patient otherwise to follow-up with PCP as above, only medication changes during hospitalization was additional metoprolol as well as titration of patient's insulin dosing given his A1c was remarkably poorly controlled with A1c 13.6.  Lantus 10 twice daily will likely need ongoing titration of insulin with PCP.  Would recommend evaluation for insulin pump monitor however patient has declined this in the past per discussion with wife.  Has notable history of A. fib was previously on Coumadin but this was discontinued.  Will need to follow-up with PCP for further evaluation as this diagnosis puts patient at a higher risk for CVA, TIA and PE -and while patient did not have notable CVA on imaging TIA is certainly not reasonable given patient's presenting symptoms and resolution.  Discharge Diagnoses:  Principal Problem:   Acute metabolic encephalopathy Active Problems:   ESRD on hemodialysis ([place])   Anemia   DM (diabetes mellitus) ([place])   Depression   GERD (gastroesophageal reflux disease)   Gait disorder   Stroke ([place])  Middle cerebral artery embolism, left  Metabolic encephalopathy, unknown etiology,  resolving, POA  TIA unable to be ruled out, CVA less likely given imaging Seizure/postictal state unlikely given EEG Questionable infectious process, unable to be ruled out  MRI remarkable for atrophy -was otherwise unremarkable for acute process EEG unremarkable for seizure-like activity; less likely post-ictal state given longevity of mental status change but cannot be ruled out Previous episode of transient leukocytosis has resolved Hyperglycemia/hypoglycemia ongoing Continue baby aspirin, adjust insulin dosage as above  Poorly controlled diabetes mellitus with hyperglycemia: ? History of running out of Lantus insulin - although this was reported when patient was altered A1C 13.6 - extremely poor glucose control Long acting insulin now 10u BID - follow for outpatient adjustment with PCP  ?AKI vs CKD4 s/p renal transplant  Nephro following - patient near baseline renal function Tacrolimus level is normal limit Previously on hemodialysis until renal transplant 3 to 4 years ago. Continue prednisone and tacrolimus  SIRS criteria - fever, leukocytosis previously now resolved Cultures remain negative - without complaint No clear source thus far  Nausea/vomiting, transient, resolved ? secondary to gastroparesis.   Now resolved. Tolerating PO well without issues  Chronic a-fib; rate controlled Rate controlled on metoprolol. ?previously on coumadin - unclear if history of contraindication but no longer on per outpatient physician - will need close follow up  Hypertension:  Continue Norvasc and metoprolol  Hyperlipidemia:  Continue atorvastatin   Allergies as of 11/04/2018      Reactions   Metformin And Related Other (See Comments)   Has kidney problems      Medication List    STOP taking these medications   Basaglar KwikPen 100 UNIT/ML Sopn Replaced by: insulin glargine 100 UNIT/ML injection     TAKE these medications   amLODipine 10 MG tablet Commonly known  as: NORVASC Take 10 mg by mouth daily.   aspirin EC 81 MG tablet Take 81 mg by mouth daily.   atorvastatin 10 MG tablet Commonly known as: LIPITOR Take 1 tablet (10 mg total) by mouth daily.   calcitRIOL 0.25 MCG capsule Commonly known as: ROCALTROL Take 0.5 mcg by mouth daily.   [employer] D3 50 MCG (2000 UT) Caps Generic drug: Cholecalciferol Take 4,000 Units by mouth daily.   insulin glargine 100 UNIT/ML injection Commonly known as: LANTUS Inject 0.1 mLs (10 Units total) into the skin 2 (two) times daily. Replaces: Basaglar KwikPen 100 UNIT/ML Sopn   ipratropium-albuterol 0.5-2.5 (3) MG/3ML Soln Commonly known as: DUONEB Take 3 mLs by nebulization every 6 (six) hours as needed.   metoprolol tartrate 50 MG tablet Commonly known as: LOPRESSOR Take 1 tablet (50 mg total) by mouth 2 (two) times daily.   mycophenolate 180 MG EC tablet Commonly known as: MYFORTIC Take 360 mg by mouth 2 (two) times daily.   omeprazole 40 MG capsule Commonly known as: PRILOSEC Take 40 mg by mouth daily.   PhosLo 667 MG capsule Generic drug: calcium acetate Take 1,334 mg by mouth 3 (three) times daily with meals.   predniSONE 5 MG tablet Commonly known as: DELTASONE Take 5 mg by mouth daily with breakfast.   Slow-Mag 64 MG Tbec SR tablet Generic drug: magnesium chloride Take 2 tablets by mouth 2 (two) times daily. TAKE AT LEAST 2 HOURS SEPARATE FROM PROGRAF/TACROLIMUS   sodium bicarbonate 650 MG tablet Take 1,950 mg by mouth 4 (four) times daily.   tacrolimus 1 MG capsule Commonly known as: PROGRAF Take 3-4 mg by mouth See  admin instructions. Take 4 mg by mouth in the morning at 9 AM and 3 mg in the evening at 9 PM   tamsulosin 0.4 MG Caps capsule Commonly known as: FLOMAX Take 0.4 mg by mouth daily after breakfast.       Allergies  Allergen Reactions  . Metformin And Related Other (See Comments)    Has kidney problems     Consultations:  Nephrology  Procedures/Studies: Dg Abd 1 View  Result Date: 10/29/2018 CLINICAL DATA:  Vomiting EXAM: ABDOMEN - 1 VIEW COMPARISON:  None. FINDINGS: Diffuse gaseous distention of bowel, favor ileus. No organomegaly or free air. Vascular calcifications and surgical clips in the abdomen and pelvis. IMPRESSION: Diffuse gaseous distention of bowel, favor ileus. Electronically Signed   By: Rolm Baptise M.D.   On: 10/29/2018 08:59   Ct Head Wo Contrast  Result Date: 10/29/2018 CLINICAL DATA:  63 year old patient with code stroke presentation status post tPA. EXAM: UNSPECIFIED TECHNIQUE: Contiguous axial images were obtained from the base of the skull through the vertex without intravenous contrast. COMPARISON:  10/28/2018 head CT. FINDINGS: Brain: There is intravascular contrast present. No intracranial mass effect. No ventriculomegaly. No acute intracranial hemorrhage identified. Gray-white matter differentiation throughout the brain appears stable. No acute or evolving infarct identified. No abnormal enhancement identified. Vascular: Calcified atherosclerosis at the skull base. The major intracranial vascular structures seem to be normally enhancing. Skull: Stable. Sinuses/Orbits: Trace paranasal sinus mucosal thickening. Tympanic cavities and mastoids remain clear. Other: No acute orbit or scalp soft tissue findings. IMPRESSION: Some intravascular contrast is present. Stable since yesterday and negative CT appearance of the brain. Electronically Signed   By: Genevie Ann M.D.   On: 10/29/2018 03:51   Mr Brain Wo Contrast  Result Date: 10/29/2018 CLINICAL DATA:  Code stroke treated with tPA. EXAM: MRI HEAD WITHOUT CONTRAST TECHNIQUE: Multiplanar, multiecho pulse sequences of the brain and surrounding structures were obtained without intravenous contrast. COMPARISON:  CT studies 10/28/2018 and same day. FINDINGS: Brain: Diffusion imaging does not show any acute or subacute infarction.  Brainstem and cerebellum are normal. Cerebral hemispheres show mild age related volume loss but no evidence of focal infarction. Minimal T2 and FLAIR foci in the frontal white matter, often seen at this age even in healthy persons. No mass lesion, hemorrhage, hydrocephalus or extra-axial collection. Vascular: Major vessels at the base of the brain show flow. Skull and upper cervical spine: Negative Sinuses/Orbits: Small amount of fluid layering dependently in the right maxillary sinus and in the sphenoid sinus. Other: None IMPRESSION: No acute finding by MRI. Mild age related volume loss. Minimal T2 and FLAIR foci in the frontal white matter. Electronically Signed   By: Nelson Chimes M.D.   On: 10/29/2018 14:35   Dg Chest Port 1 View  Result Date: 10/28/2018 CLINICAL DATA:  Follow-up intubation EXAM: PORTABLE CHEST 1 VIEW COMPARISON:  05/23/2018 FINDINGS: Heart and mediastinal shadows are normal. Aortic atherosclerosis. The lungs are clear. No effusions. No bone abnormality. IMPRESSION: No active disease. Electronically Signed   By: Nelson Chimes M.D.   On: 10/28/2018 17:03   Dg Abd Portable 1v  Result Date: 10/30/2018 CLINICAL DATA:  Abdominal pain EXAM: PORTABLE ABDOMEN - 1 VIEW COMPARISON:  10/30/2018 FINDINGS: Gastric catheter within the stomach. Scattered large and small bowel gas is noted. No abnormal mass is seen. There are calcifications identified in the mid abdomen bilaterally likely related to nonobstructing renal calculi. Multiple phleboliths are noted within the pelvis. No bony abnormality is seen. IMPRESSION:  Gastric catheter in place. Bilateral renal calculi.  No definitive ureteral stones are seen. Electronically Signed   By: Inez Catalina M.D.   On: 10/30/2018 11:18   Dg Abd Portable 1v  Result Date: 10/30/2018 CLINICAL DATA:  Check gastric catheter placement EXAM: PORTABLE ABDOMEN - 1 VIEW COMPARISON:  10/29/2018 FINDINGS: Gastric catheter is now noted within the stomach. Scattered large  and small bowel gas is noted. Bilateral renal calculi are noted somewhat better visualized on today's exam. IMPRESSION: Gastric catheter within the stomach. Electronically Signed   By: Inez Catalina M.D.   On: 10/30/2018 08:10   Ct Head Code Stroke Wo Contrast  Result Date: 10/28/2018 CLINICAL DATA:  Code stroke.  Aphasia.  Right-sided weakness. EXAM: CT HEAD WITHOUT CONTRAST TECHNIQUE: Contiguous axial images were obtained from the base of the skull through the vertex without intravenous contrast. COMPARISON:  05/24/2013 FINDINGS: Brain: There is no evidence of acute infarct, intracranial hemorrhage, mass, midline shift, or extra-axial fluid collection. There is mild cerebral atrophy. Cerebral white matter hypodensities are nonspecific but compatible with mild chronic small vessel ischemic disease. Vascular: Calcified atherosclerosis at the skull base. No hyperdense vessel. Skull: No fracture or suspicious osseous lesion. Unchanged subcentimeter sclerotic focus in the right mandibular condyle, possibly a bone island. Sinuses/Orbits: Visualized paranasal sinuses and mastoid air cells are clear. Bilateral cataract extraction is noted. Other: None. ASPECTS [facility] Stroke Program Early CT Score) - Ganglionic level infarction (caudate, lentiform nuclei, internal capsule, insula, M1-M3 cortex): 7 - Supraganglionic infarction (M4-M6 cortex): 3 Total score (0-10 with 10 being normal): 10 IMPRESSION: 1. No evidence of acute intracranial abnormality. 2. ASPECTS is 10. 3. Mild chronic small vessel ischemic disease and cerebral atrophy. These results were communicated to Dr. Leonie Man at 1:25 pm on 10/28/2018 by text page via the [facility] messaging system. Electronically Signed   By: Logan Bores M.D.   On: 10/28/2018 13:25     Subjective: No acute issues or events overnight, patient tolerating p.o. well denies chest pain, shortness of breath, nausea, vomiting, diarrhea, constipation, headache, fever, chills.   Discharge  Exam: Vitals:   11/03/18 2004 11/04/18 0504  BP: (!) 122/54 130/61  Pulse: (!) 48 (!) 44  Resp: 18 18  Temp: 97.9 F (36.6 C) 98.3 F (36.8 C)  SpO2: 98% 100%   Vitals:   11/03/18 1144 11/03/18 2004 11/04/18 0500 11/04/18 0504  BP: (!) 131/53 (!) 122/54  130/61  Pulse: (!) 47 (!) 48  (!) 44  Resp: 16 18  18   Temp: 97.8 F (36.6 C) 97.9 F (36.6 C)  98.3 F (36.8 C)  TempSrc: Oral Oral  Oral  SpO2: 100% 98%  100%  Weight:   77.6 kg     General:  Pleasantly resting in bed, No acute distress. HEENT:  Normocephalic atraumatic.  Sclerae nonicteric, noninjected.  Extraocular movements intact bilaterally. Neck:  Without mass or deformity.  Trachea is midline. Lungs:  Clear to auscultate bilaterally without rhonchi, wheeze, or rales. Heart:  Regular rate and rhythm.  Without murmurs, rubs, or gallops. Abdomen:  Soft, nontender, nondistended.  Without guarding or rebound. Extremities: Without cyanosis, clubbing, edema, bilateral BKA Skin:  Warm and dry, no erythema, no ulcerations.   The results of significant diagnostics from this hospitalization (including imaging, microbiology, ancillary and laboratory) are listed below for reference.     Microbiology: Recent Results (from the past 240 hour(s))  SARS Coronavirus 2     Status: None   Collection Time: 10/28/18  1:51 PM  Result Value Ref Range Status   SARS Coronavirus 2 NOT DETECTED NOT DETECTED Final    Comment: (NOTE) SARS-CoV-2 target nucleic acids are NOT DETECTED. The SARS-CoV-2 RNA is generally detectable in upper and lower respiratory specimens during the acute phase of infection.  Negative  results do not preclude SARS-CoV-2 infection, do not rule out co-infections with other pathogens, and should not be used as the sole basis for treatment or other patient management decisions.  Negative results must be combined with clinical observations, patient history, and epidemiological information. The expected result is  Not Detected. Fact Sheet for Patients: http://www.biofiredefense.com/wp-content/uploads/2020/03/BIOFIRE-COVID -19-patients.pdf Fact Sheet for Healthcare Providers: http://www.biofiredefense.com/wp-content/uploads/2020/03/BIOFIRE-COVID -19-hcp.pdf This test is not yet approved or cleared by the [place] and  has been authorized for detection and/or diagnosis of SARS-CoV-2 by FDA under an Emergency Use Authorization (EUA).  This EUA will remain in effec t (meaning this test can be used) for the duration of  the COVID-19 declaration under Section 564(b)(1) of the Act, 21 U.S.C. section 360bbb-3(b)(1), unless the authorization is terminated or revoked sooner. Performed at [facility], [place] [address], [place], [place] [address]   MRSA PCR Screening     Status: None   Collection Time: 10/28/18  6:15 PM   Specimen: Nasal Mucosa; Nasopharyngeal  Result Value Ref Range Status   MRSA by PCR NEGATIVE NEGATIVE Final    Comment:        The GeneXpert MRSA Assay (FDA approved for NASAL specimens only), is one component of a comprehensive MRSA colonization surveillance program. It is not intended to diagnose MRSA infection nor to guide or monitor treatment for MRSA infections. Performed at [facility], [place] [address] [place], [place] [address]   Culture, blood (routine x 2)     Status: None   Collection Time: 10/29/18  4:56 PM   Specimen: BLOOD  Result Value Ref Range Status   Specimen Description BLOOD LEFT ANTECUBITAL  Final   Special Requests   Final    BOTTLES DRAWN AEROBIC ONLY Blood Culture adequate volume   Culture   Final    NO GROWTH 5 DAYS Performed at [facility], [place] [address], [place], [place] [address]    Report Status 11/03/2018 FINAL  Final  Culture, blood (routine x 2)     Status: None   Collection Time: 10/29/18  6:45 PM   Specimen: BLOOD  Result Value Ref Range Status   Specimen Description BLOOD LEFT ANTECUBITAL  Final   Special  Requests   Final    BOTTLES DRAWN AEROBIC ONLY Blood Culture results may not be optimal due to an inadequate volume of blood received in culture bottles   Culture   Final    NO GROWTH 5 DAYS Performed at [facility], [place] [address], [place], [place] [address]    Report Status 11/03/2018 FINAL  Final     Labs: BNP (last 3 results) No results for input(s): BNP in the last 8760 hours. Basic Metabolic Panel: Recent Labs  Lab 10/28/18 2142 10/29/18 0530 10/31/18 0533 11/01/18 0530 11/02/18 0602 11/03/18 0614 11/04/18 0858  NA 134* 133* 140 140 139 137 137  K 3.1* 3.6 3.5 3.5 3.6 4.1 4.4  CL 105 103 111 110 110 110 107  CO2 17* 20* 21* 21* 21* 19* 23  GLUCOSE 182* 198* 112* 74 99 274* 256*  BUN 34* 30* 28* 25* 37* 47* 53*  CREATININE 3.15* 3.30* 3.79* 3.63* 4.10* 3.97* 3.68*  CALCIUM 8.9 9.0 7.8* 8.0* 7.5* 7.7* 8.0*  MG 1.1* 1.6* 2.3  --   --   --   --   PHOS  --   --   --   --   --  3.2 2.7   Liver Function Tests: Recent Labs  Lab 10/28/18 1327  AST 11*  ALT 12  ALKPHOS 57  BILITOT 0.9  PROT 5.3*  ALBUMIN 3.3*   Recent Labs  Lab 10/28/18 1830  LIPASE 23  AMYLASE 28   No results for input(s): AMMONIA in the last 168 hours. CBC: Recent Labs  Lab 10/28/18 1327 10/29/18 0530 11/02/18 0602 11/03/18 0614 11/04/18 0858  WBC 9.4 16.1* 9.0 6.9 6.2  NEUTROABS 7.1 13.8*  --   --   --   HGB 9.9* 9.5* 8.8* 8.8* 8.7*  HCT 30.8* 29.8* 28.6* 28.9* 28.8*  MCV 87.5 87.1 91.4 92.3 90.9  PLT 123* 140* 129* 146* 153   Cardiac Enzymes: No results for input(s): CKTOTAL, CKMB, CKMBINDEX, TROPONINI in the last 168 hours. BNP: Invalid input(s): POCBNP CBG: Recent Labs  Lab 11/03/18 0536 11/03/18 1143 11/03/18 1613 11/03/18 2120 11/04/18 0554  GLUCAP 269* 195* 298* 337* 234*   D-Dimer No results for input(s): DDIMER in the last 72 hours. Hgb A1c No results for input(s): HGBA1C in the last 72 hours. Lipid Profile No results for input(s): CHOL, HDL, LDLCALC,  TRIG, CHOLHDL, LDLDIRECT in the last 72 hours. Thyroid function studies No results for input(s): TSH, T4TOTAL, T3FREE, THYROIDAB in the last 72 hours.  Invalid input(s): FREET3 Anemia work up No results for input(s): VITAMINB12, FOLATE, FERRITIN, TIBC, IRON, RETICCTPCT in the last 72 hours. Urinalysis    Component Value Date/Time   COLORURINE YELLOW 11/01/2018 1849   APPEARANCEUR CLEAR 11/01/2018 1849   LABSPEC 1.017 11/01/2018 1849   PHURINE 5.0 11/01/2018 1849   GLUCOSEU 150 (A) 11/01/2018 1849   HGBUR SMALL (A) 11/01/2018 [place] 11/01/2018 [place] 11/01/2018 1849   PROTEINUR 30 (A) 11/01/2018 1849   UROBILINOGEN 0.2 08/02/2014 1424   NITRITE NEGATIVE 11/01/2018 1849   LEUKOCYTESUR NEGATIVE 11/01/2018 1849   Sepsis Labs Invalid input(s): PROCALCITONIN,  WBC,  LACTICIDVEN Microbiology Recent Results (from the past 240 hour(s))  SARS Coronavirus 2     Status: None   Collection Time: 10/28/18  1:51 PM  Result Value Ref Range Status   SARS Coronavirus 2 NOT DETECTED NOT DETECTED Final    Comment: (NOTE) SARS-CoV-2 target nucleic acids are NOT DETECTED. The SARS-CoV-2 RNA is generally detectable in upper and lower respiratory specimens during the acute phase of infection.  Negative  results do not preclude SARS-CoV-2 infection, do not rule out co-infections with other pathogens, and should not be used as the sole basis for treatment or other patient management decisions.  Negative results must be combined with clinical observations, patient history, and epidemiological information. The expected result is Not Detected. Fact Sheet for Patients: http://www.biofiredefense.com/wp-content/uploads/2020/03/BIOFIRE-COVID -19-patients.pdf Fact Sheet for Healthcare Providers: http://www.biofiredefense.com/wp-content/uploads/2020/03/BIOFIRE-COVID -19-hcp.pdf This test is not yet approved or cleared by the [place] and  has been  authorized for detection and/or diagnosis of SARS-CoV-2 by FDA under an Emergency Use Authorization (EUA).  This EUA will remain in effec t (meaning this test can be used) for the duration of  the COVID-19 declaration under Section 564(b)(1) of the Act, 21 U.S.C. section 360bbb-3(b)(1), unless the authorization is terminated or revoked sooner. Performed at [facility], [place] [address],  [place], [place] [address]   MRSA PCR Screening     Status: None   Collection Time: 10/28/18  6:15 PM   Specimen: Nasal Mucosa; Nasopharyngeal  Result Value Ref Range Status   MRSA by PCR NEGATIVE NEGATIVE Final    Comment:        The GeneXpert MRSA Assay (FDA approved for NASAL specimens only), is one component of a comprehensive MRSA colonization surveillance program. It is not intended to diagnose MRSA infection nor to guide or monitor treatment for MRSA infections. Performed at [facility], [place] [address] [place], [place] [address]   Culture, blood (routine x 2)     Status: None   Collection Time: 10/29/18  4:56 PM   Specimen: BLOOD  Result Value Ref Range Status   Specimen Description BLOOD LEFT ANTECUBITAL  Final   Special Requests   Final    BOTTLES DRAWN AEROBIC ONLY Blood Culture adequate volume   Culture   Final    NO GROWTH 5 DAYS Performed at [facility], [place] [address], [place], [place] [address]    Report Status 11/03/2018 FINAL  Final  Culture, blood (routine x 2)     Status: None   Collection Time: 10/29/18  6:45 PM   Specimen: BLOOD  Result Value Ref Range Status   Specimen Description BLOOD LEFT ANTECUBITAL  Final   Special Requests   Final    BOTTLES DRAWN AEROBIC ONLY Blood Culture results may not be optimal due to an inadequate volume of blood received in culture bottles   Culture   Final    NO GROWTH 5 DAYS Performed at [facility], [place] [address], [place], [place] [address]    Report Status 11/03/2018 FINAL  Final     Time  coordinating discharge: Over 30 minutes  SIGNED:   Little Ishikawa, DO [facility] 11/04/2018, 10:53 AM Pager   If 7PM-7AM, please contact night-coverage [email or web address] Password [email or web address]

## 2018-11-05 NOTE — Care Management (Signed)
1300 11-05-18 [facility] called to state that [facility] is willing to accept the patient. [facility] to make Patient aware. SOC will be Saturday. No further needs from CM at this time. Bethena Roys, RN BSN Case Manager [phone]

## 2018-11-05 NOTE — Care Management (Signed)
11-05-18 1040 CM did call [employer] direct at [phone] to see if agency has been obtained for [place]. Agency has not been assigned at this time. CM did contact [employer] and DME RW will be sent to the patient's home. CM will continue to monitor for additional transition of care needs. Bethena Roys, RN,BSN Case Manager [phone]

## 2018-11-09 ENCOUNTER — Encounter (HOSPITAL_COMMUNITY): Payer: Self-pay | Admitting: Radiology

## 2018-11-10 ENCOUNTER — Encounter (HOSPITAL_COMMUNITY): Payer: Self-pay | Admitting: Interventional Radiology

## 2018-11-11 ENCOUNTER — Other Ambulatory Visit: Payer: Self-pay

## 2018-11-11 ENCOUNTER — Other Ambulatory Visit (HOSPITAL_COMMUNITY): Payer: Self-pay | Admitting: Neurology

## 2018-11-11 ENCOUNTER — Encounter (HOSPITAL_COMMUNITY): Payer: Self-pay

## 2018-11-11 ENCOUNTER — Encounter (HOSPITAL_COMMUNITY)
Admission: RE | Admit: 2018-11-11 | Discharge: 2018-11-11 | Disposition: A | Discharge status: Home / Self Care | Payer: Managed Care, Other (non HMO) | Source: Ambulatory Visit | Attending: Nephrology | Admitting: Nephrology

## 2018-11-11 VITALS — BP 129/55 | HR 43 | Temp 97.4°F | Resp 18

## 2018-11-11 DIAGNOSIS — D638 Anemia in other chronic diseases classified elsewhere: Secondary | ICD-10-CM | POA: Diagnosis present

## 2018-11-11 DIAGNOSIS — I639 Cerebral infarction, unspecified: Secondary | ICD-10-CM

## 2018-11-11 LAB — FERRITIN: Ferritin: 388 ng/mL — ABNORMAL HIGH (ref 24–336)

## 2018-11-11 LAB — IRON AND TIBC
Iron: 54 ug/dL (ref 45–182)
Saturation Ratios: 29 % (ref 17.9–39.5)
TIBC: 189 ug/dL — ABNORMAL LOW (ref 250–450)
UIBC: 135 ug/dL

## 2018-11-11 LAB — POCT HEMOGLOBIN-HEMACUE: Hemoglobin: 8 g/dL — ABNORMAL LOW (ref 13.0–17.0)

## 2018-11-11 MED ORDER — DARBEPOETIN ALFA 200 MCG/0.4ML IJ SOSY
PREFILLED_SYRINGE | INTRAMUSCULAR | Status: AC
  Administered 2018-11-11: 225 ug via SUBCUTANEOUS
  Filled 2018-11-11: qty 0.4

## 2018-11-11 MED ORDER — DARBEPOETIN ALFA 25 MCG/0.42ML IJ SOSY
PREFILLED_SYRINGE | INTRAMUSCULAR | Status: AC
  Filled 2018-11-11: qty 0.42

## 2018-11-11 MED ORDER — DARBEPOETIN ALFA 300 MCG/0.6ML IJ SOSY
225.0000 ug | PREFILLED_SYRINGE | INTRAMUSCULAR | Status: DC
  Administered 2018-11-11: 13:00:00 225 ug via SUBCUTANEOUS

## 2018-11-12 MED FILL — Darbepoetin Alfa Soln Prefilled Syringe 200 MCG/0.4ML: INTRAMUSCULAR | Qty: 0.4 | Status: AC

## 2018-11-12 MED FILL — Darbepoetin Alfa Soln Prefilled Syringe 25 MCG/0.42ML: INTRAMUSCULAR | Qty: 0.42 | Status: AC

## 2018-11-16 DIAGNOSIS — Z794 Long term (current) use of insulin: Secondary | ICD-10-CM | POA: Diagnosis not present

## 2018-11-16 DIAGNOSIS — Z23 Encounter for immunization: Secondary | ICD-10-CM | POA: Diagnosis not present

## 2018-11-16 DIAGNOSIS — N183 Chronic kidney disease, stage 3 (moderate): Secondary | ICD-10-CM | POA: Diagnosis not present

## 2018-11-16 DIAGNOSIS — Z89519 Acquired absence of unspecified leg below knee: Secondary | ICD-10-CM | POA: Diagnosis not present

## 2018-11-16 DIAGNOSIS — R011 Cardiac murmur, unspecified: Secondary | ICD-10-CM | POA: Diagnosis not present

## 2018-11-16 DIAGNOSIS — G459 Transient cerebral ischemic attack, unspecified: Secondary | ICD-10-CM | POA: Diagnosis not present

## 2018-11-16 DIAGNOSIS — E1122 Type 2 diabetes mellitus with diabetic chronic kidney disease: Secondary | ICD-10-CM | POA: Diagnosis not present

## 2018-11-16 DIAGNOSIS — Z8679 Personal history of other diseases of the circulatory system: Secondary | ICD-10-CM | POA: Diagnosis not present

## 2018-11-18 ENCOUNTER — Other Ambulatory Visit: Payer: Self-pay

## 2018-11-18 ENCOUNTER — Encounter: Payer: Self-pay | Admitting: Physical Therapy

## 2018-11-18 ENCOUNTER — Ambulatory Visit: Payer: Managed Care, Other (non HMO) | Attending: Family Medicine | Admitting: Physical Therapy

## 2018-11-18 VITALS — BP 154/57 | HR 55

## 2018-11-18 DIAGNOSIS — M6281 Muscle weakness (generalized): Secondary | ICD-10-CM | POA: Insufficient documentation

## 2018-11-18 DIAGNOSIS — Z9181 History of falling: Secondary | ICD-10-CM | POA: Diagnosis not present

## 2018-11-18 DIAGNOSIS — R2681 Unsteadiness on feet: Secondary | ICD-10-CM | POA: Diagnosis not present

## 2018-11-18 DIAGNOSIS — R2689 Other abnormalities of gait and mobility: Secondary | ICD-10-CM | POA: Insufficient documentation

## 2018-11-18 DIAGNOSIS — R293 Abnormal posture: Secondary | ICD-10-CM | POA: Diagnosis not present

## 2018-11-19 ENCOUNTER — Ambulatory Visit: Payer: Managed Care, Other (non HMO) | Admitting: Physical Therapy

## 2018-11-19 NOTE — Therapy (Signed)
[facility] [address] [place], [place], [address] Phone: [phone]   Fax:  [phone]  Physical Therapy Evaluation  Patient Details  Name: Paul [name] MRN: [id number] Date of Birth: Jul 28, 1955 Referring Provider (PT): Dibas Dorthy Cooler, MD   Encounter Date: 11/18/2018   CLINIC OPERATION CHANGES: [facility] is open at lower capacity following universal masking, social distancing, and patient screening.  The patient's COVID risk of complications score is 6.   PT End of Session - 11/18/18 1144    Visit Number  1    Number of Visits  25    Date for PT Re-Evaluation  02/16/19    Authorization Type  [employer] & Medicare Part B    PT Start Time  1057    PT Stop Time  1148    PT Time Calculation (min)  51 min    Equipment Utilized During Treatment  Gait belt    Activity Tolerance  Patient tolerated treatment well;Patient limited by fatigue    Behavior During Therapy  WFL for tasks assessed/performed       Past Medical History:  Diagnosis Date  . A-fib ([place])   . Anemia    esrd  . Calciphylaxis 05/2013   lower ext  . Cancer ([place])    hx duodenal adenoCa 2002, resected  . Closed left arm fracture 1967  . Corneal abrasion, left   . Depression   . Diabetes mellitus    Type 1 iddm x 11 yrs  . Diabetic neuropathy ([place])   . ESRD on hemodialysis ([place])    Pt has ESRD due to DM.  He had a L upper arm AVF prior to starting HD which was ligated due to L arm steal syndrome.  He started HD in Jan 2014 and did home HD.  The family couldn't cannulate the R arm AVF successfully so by mid 2014 they decided to switch to PD which was done late summer 2014.  In Jan 2015 he was admitted with FTT felt to be due to underdialysis on PD and PD was abandoned and he   . GERD (gastroesophageal reflux disease)   . Glomerulosclerosis, diabetic ([place])   . Heart murmur   . History of blood transfusion   . Hypertension    off of  meds due to orthostatic hypotension  . Kidney transplant recipient   . Open wound of both legs with complication    Pt has had progressive wounds of both LE's including gangrene of the toes and patchy necrosis of the calves.  He has been treated at [facility] by Dr Jerline Pain with hyperbaric O2 5d per week.  He is getting Na Thiosulfate with HD for suspected calciphylaxis. Pt says doctor's aren't sure if these ulcers were diabetic ulcers or calciphylaxis.  He underwent bilat BKA on 07/30/13.  He says that the woun    Past Surgical History:  Procedure Laterality Date  . AMPUTATION Bilateral 07/30/2013   Procedure: AMPUTATION BELOW KNEE;  Surgeon: Newt Minion, MD;  Location: [facility];  Service: Orthopedics;  Laterality: Bilateral;  Bilateral Below Knee Amputations  . AMPUTATION Bilateral 09/03/2013   Procedure: AMPUTATION BELOW KNEE;  Surgeon: Newt Minion, MD;  Location: [facility];  Service: Orthopedics;  Laterality: Bilateral;  Bilateral Below Knee Amputation Revision  . AV FISTULA PLACEMENT  06/07/2011   Procedure: ARTERIOVENOUS (AV) FISTULA CREATION;  Surgeon: Angelia Mould, MD;  Location: [facility];  Service: Vascular;  Laterality: Left;  .  AV FISTULA PLACEMENT  06/18/2012   Procedure: ARTERIOVENOUS (AV) FISTULA CREATION;  Surgeon: Mal Misty, MD;  Location: [facility];  Service: Vascular;  Laterality: Right;  [place]  . BILIARY DIVERSION, EXTERNAL  2002  . BONE MARROW BIOPSY  2012  . CAPD INSERTION N/A 01/14/2013   Procedure: LAPAROSCOPIC INSERTION CONTINUOUS AMBULATORY PERITONEAL DIALYSIS  (CAPD) CATHETER;  Surgeon: Adin Hector, MD;  Location: [facility];  Service: General;  Laterality: N/A;  . CAPD REMOVAL N/A 07/09/2013   Procedure: CONTINUOUS AMBULATORY PERITONEAL DIALYSIS  (CAPD) CATHETER REMOVAL;  Surgeon: Adin Hector, MD;  Location: [facility];  Service: General;  Laterality: N/A;  . COLONOSCOPY     Hx: of  . INSERTION OF DIALYSIS CATHETER  05/22/2012   Procedure: INSERTION OF DIALYSIS CATHETER;   Surgeon: Mal Misty, MD;  Location: [facility];  Service: Vascular;  Laterality: N/A;  right internal jugular vein  . IR ANGIO INTRA EXTRACRAN SEL COM CAROTID INNOMINATE BILAT MOD SED  10/28/2018  . IR ANGIO VERTEBRAL SEL SUBCLAVIAN INNOMINATE UNI R MOD SED  10/28/2018  . IR ANGIO VERTEBRAL SEL VERTEBRAL UNI L MOD SED  10/28/2018  . LIGATION OF ARTERIOVENOUS  FISTULA  05/22/2012   Procedure: LIGATION OF ARTERIOVENOUS  FISTULA;  Surgeon: Mal Misty, MD;  Location: [facility];  Service: Vascular;  Laterality: Left;  Left brachio-cephalic fistula  . LOWER EXTREMITY ANGIOGRAM N/A 06/14/2013   Procedure: LOWER EXTREMITY ANGIOGRAM;  Surgeon: Angelia Mould, MD;  Location: [facility] CATH LAB;  Service: Cardiovascular;  Laterality: N/A;  . NEPHRECTOMY TRANSPLANTED ORGAN    . OTHER SURGICAL HISTORY     Cyst removed from back  . PORTACATH PLACEMENT  2002  . RADIOLOGY WITH ANESTHESIA N/A 10/28/2018   Procedure: IR WITH ANESTHESIA;  Surgeon: Radiologist, Medication, MD;  Location: [facility];  Service: Radiology;  Laterality: N/A;  . REMOVAL OF A DIALYSIS CATHETER    . RENAL BIOPSY, PERCUTANEOUS  2012  . TONSILLECTOMY    . VASCULAR SURGERY    . WHIPPLE PROCEDURE  2002   duodenal ca, Dr. Harlow Asa    Vitals:   11/18/18 1110 11/18/18 1140  BP: (!) 146/62 (!) 154/57  Pulse: (!) 53 (!) 55  SpO2: 98% 99%     Subjective Assessment - 11/18/18 1102    Subjective  This 63yo male was referred to PT on 11/17/2018 with TIA with deconditioning & hx of Bilateral Transtibial Amputations by [name], MD. Patient was hospitalized 10/28/2018 - 11/04/2018 with acute metabolic encephalopathy of unknown origin & hyperglycemia.    Patient is accompained by:  Family member   wife, Paul [name]   Pertinent History  HTN, DM, renal transplant 05/23/2014, CKD st IV, calciphylaxis, A-Fib, bil. TTAs 07/30/2013,    Patient Stated Goals  To stop falls, get stronger, negotiate stairs better, to return to prior functional level, walk in/out of  office safely without fatigue issues.    Currently in Pain?  No/denies         [facility] PT Assessment - 11/18/18 1100      Assessment   Medical Diagnosis  TIA / encephalopathy & deconditioning    Referring Provider (PT)  [name], MD    Onset Date/Surgical Date  10/28/18    Hand Dominance  Right    Prior Therapy  PT for Bil. TTAs in 2015/6      Precautions   Precautions  Fall    Precaution Comments  No BP in RUE  Restrictions   Weight Bearing Restrictions  No      Balance Screen   Has the patient fallen in the past 6 months  Yes    How many times?  2   uneven footing, in from rain, stubbed toe on step   Has the patient had a decrease in activity level because of a fear of falling?   Yes    Is the patient reluctant to leave their home because of a fear of falling?   Yes      [place] residence    Living Arrangements  Spouse/significant other;Other relatives   62yo mother-n-law   Type of [place] entrance;Stairs to enter    Entrance Stairs-Number of Steps  4    Entrance Stairs-Rails  Right;Left;Cannot reach both    Home Layout  Two level;Able to live on main level with bedroom/bathroom   single step into den   Alternate Level Stairs-Number of Steps  14-16    Alternate Level Stairs-Rails  Right;Left   switchs sides at landing   [employer] - 2 wheels;Cane - single point;Crutches;Tub bench;Grab bars - toilet;Wheelchair - manual    Additional Comments  bil. 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[employer], sedentary       Posture/Postural Control   Posture/Postural Control  Postural limitations    Postural Limitations  Forward head;Rounded Shoulders;Flexed trunk   wide stance     Tone   Assessment Location  Left Lower Extremity;Right Lower Extremity      ROM / Strength   AROM / PROM / Strength  AROM;Strength      AROM   Overall AROM   Within functional limits for tasks performed      Strength   Overall Strength  Deficits    Overall Strength Comments  BUEs: shoulder grossly 4/5, elbows grossly 4+/5, grip right fair, left good    Strength Assessment Site  Hip;Knee    Right Hip Flexion  3/5    Right Hip Extension  3/5   tested standing with BUE support   Right Hip ABduction  3+/5   tested standing with BUE support   Left Hip Flexion  4/5    Left Hip Extension  4-/5   tested standing with BUE support   Left Hip ABduction  4-/5   tested standing with BUE support   Right Knee Flexion  3+/5    Right Knee Extension  4/5    Left Knee Flexion  4/5    Left Knee Extension  4+/5      Transfers   Transfers  Sit to Stand;Stand to Sit    Sit to Stand  5: Supervision;With upper extremity assist;With armrests;From chair/3-in-1   uses back of legs against chair to stabilze   Stand to Sit  5: Supervision;With upper extremity assist;With armrests;To chair/3-in-1   uses back of legs against chair to control     Ambulation/Gait   Ambulation/Gait  Yes    Ambulation/Gait Assistance  5: Supervision;4: Min assist  supervision cane quad cane & MinA no AD   Ambulation Distance (Feet)  100 Feet    Assistive device  Prostheses;Straight cane;None   straight cane quad tip   Gait Pattern  Step-through pattern;Decreased arm swing - right;Decreased stance time - right;Antalgic;Trunk flexed;Wide base of support    Ambulation Surface  Indoor;Level    Gait velocity  2.52 ft/sec with straight cane quad tip    Stairs  Yes    Stairs Assistance  5: Supervision    Stairs Assistance Details (indicate cue type and  reason)  excessive support on bilateral rails,  patient reports problems with stairs especially when unable to support on rails.    Stair Management Technique  Two rails;Alternating pattern;Forwards   slow motion   Number of Stairs  4      Standardized Balance Assessment   Standardized Balance Assessment  Berg Balance Test;Timed Up and Go Test;Dynamic Gait Index      Berg Balance Test   Sit to Stand  Able to stand  independently using hands    Standing Unsupported  Able to stand 2 minutes with supervision    Sitting with Back Unsupported but Feet Supported on Floor or Stool  Able to sit safely and securely 2 minutes    Stand to Sit  Controls descent by using hands    Transfers  Able to transfer safely, definite need of hands    Standing Unsupported with Eyes Closed  Able to stand 10 seconds with supervision    Standing Unsupported with Feet Together  Able to place feet together independently and stand for 1 minute with supervision    From Standing, Reach Forward with Outstretched Arm  Can reach forward >5 cm safely (2")    From Standing Position, Pick up Object from Floor  Unable to try/needs assist to keep balance   uses cane to erect back   From Standing Position, Turn to Look Behind Over each Shoulder  Needs supervision when turning    Turn 360 Degrees  Needs assistance while turning    Standing Unsupported, Alternately Place Feet on Step/Stool  Needs assistance to keep from falling or unable to try    Standing Unsupported, One Foot in [employer] balance while stepping or standing    Standing on One Leg  Unable to try or needs assist to prevent fall    Total Score  25    Berg comment:  <36/56 close to 100% fall risk & dependency in standing ADLs      Dynamic Gait Index   Level Surface  Moderate Impairment    Change in Gait Speed  Severe Impairment    Gait with Horizontal Head Turns  Moderate Impairment    Gait with Vertical Head Turns  Moderate Impairment    Gait and Pivot Turn   Moderate Impairment    Step Over Obstacle  Severe Impairment    Step Around Obstacles  Moderate Impairment    Steps  Severe Impairment    Total Score  5    DGI comment:  <19/24 indicates fall risk      Timed Up and Go Test   Normal TUG (seconds)  18.28   17.16sec cane & 18.28sec no AD minA   Cognitive TUG (seconds)  18.29   no AD minA, able to maintain cognitive task   TUG Comments  Std TUG >13.5sec & cogTUG >15.5sec indicates fall risk      RLE Tone   RLE Tone  Within Functional Limits  LLE Tone   LLE Tone  Within Functional Limits      Prosthetics Assessment - 11/18/18 1100      Prosthetics   Prosthetic Care Independent with  Prosthetic cleaning;Correct ply sock adjustment;Proper wear schedule/adjustment    Prosthetic Care Dependent with  Skin check;Residual limb care    Donning prosthesis   Modified independent (Device/Increase time)    Doffing prosthesis   Modified independent (Device/Increase time)    Current prosthetic wear tolerance (days/week)   daily since hospital discharge 6/17    Current prosthetic wear tolerance (#hours/day)   >90% of time out of bed but spends increased time in bed since hospitalization    Current prosthetic weight-bearing tolerance (hours/day)   tolerated 5 minutes standing activities with no pain but fatigues.     Edema  Left residual limb has distal edema with shiny appearance    Residual limb condition   no open areas either residual limbs,     K code/activity level with prosthetic use   K3 potential based on PFS with bilateral prostheses over last 3 years.  Current prostheses are original feet, one socket revision ~4 years ago, liners are 65 years old & in disrepair,                  Objective measurements completed on examination: See above findings.                PT Short Term Goals - 11/19/18 1029      PT SHORT TERM GOAL #1   Title  Patient demonstrates understanding of initial HEP. (All STGs Target Date:  12/18/2018)    Time  4    Period  Weeks    Status  New    Target Date  12/18/18      PT SHORT TERM GOAL #2   Title  Patient ambulates 300' with cane & prostheses with supervision.    Time  4    Period  Weeks    Status  New    Target Date  12/18/18      PT SHORT TERM GOAL #3   Title  Patient negotiates stairs with 1 rail & cane with minA.    Time  4    Period  Weeks    Status  New    Target Date  12/18/18      PT SHORT TERM GOAL #4   Title  Merrilee Jansky Balance >/=36/56    Time  4    Period  Weeks    Status  New    Target Date  12/18/18        PT Long Term Goals - 11/19/18 1019      PT LONG TERM GOAL #1   Title  Patient demonstrates & verbalizes understanding of ongoing HEP. (All LTGs Target Date: 02/12/2019)    Time  12    Period  Weeks    Status  New    Target Date  02/12/19      PT LONG TERM GOAL #2   Title  Berg Balance >45/56 to indicate lower fall risk.    Time  12    Period  Weeks    Status  New    Target Date  02/12/19      PT LONG TERM GOAL #3   Title  Timed Up & Go <13.5sec & cognitive TUG <15.5sec without device except prostheses safely to indicate lower fall risk.    Time  12    Period  Weeks    Status  New    Target Date  02/12/19      PT LONG TERM GOAL #4   Title  Dynamic Gait Index >/= 19/24 with cane to indicate lower fall risk.    Time  12    Period  Weeks    Status  New    Target Date  02/12/19      PT LONG TERM GOAL #5   Title  Patient ambulates 500' with cane & prostheses modified independent for community mobility.    Time  12    Period  Weeks    Status  New    Target Date  02/12/19      PT LONG TERM GOAL #6   Title  Patient negotiates stairs 1 rail, ramps & curbs with cane & prostheses modified independent  for community access.    Time  12    Period  Weeks    Status  New    Target Date  02/12/19      PT LONG TERM GOAL #7   Title  Patient ambulates 100' around furniture with prostheses only modified independent.    Time  12     Period  Weeks    Status  New    Target Date  02/12/19             Plan - 11/19/18 1009    Clinical Impression Statement  This 63yo male was functioning with bilateral prostheses in home without device & in community with cane. His prostheses may need replacement & he needs to see prosthetist. He had hospitalization 10/28/2018-11/04/2018 with encephalopathy / TIA. He has increased weakness bilateral LEs with R>L weakness.  Patient has repeated falls with 1 since hospitalization. Berg Balance 25/56, Timed Up & Go 17.16sec with cane, 18.28sec without device with minA & Cognitive TUG 18.29sec all indicate high fall risk. Dynamic Gait Index 5/24 indicates high fall risk. Patient would benefit skilled PT to improve strength, mobility & safety.    Personal Factors and Comorbidities  Comorbidity 3+;Fitness    Comorbidities  TIA/encephalopathy, HTN, DM, renal transplant, CKD st IV, calciphylaxis, A-Fib, bil. TTAs    Examination-Activity Limitations  Caring for Others;Carry;Locomotion Level;Stairs;Stand;Transfers    Examination-Participation Restrictions  Community Activity;Driving    Stability/Clinical Decision Making  Evolving/Moderate complexity    Clinical Decision Making  Moderate    Rehab Potential  Good    PT Frequency  2x / week    PT Duration  12 weeks    PT Treatment/Interventions  ADLs/Self Care Home Management;DME Instruction;Gait training;Stair training;Functional mobility training;Therapeutic activities;Therapeutic exercise;Balance training;Neuromuscular re-education;Patient/family education;Prosthetic Training    PT Next Visit Plan  HEP for strength & balance    Recommended Other Services  see prosthetist for assessment for new prostheses    Consulted and Agree with Plan of Care  Patient;Family member/caregiver    Family Member Consulted  wife, Armari Fussell       Patient will benefit from skilled therapeutic intervention in order to improve the following deficits and impairments:   Abnormal gait, Decreased activity tolerance, Decreased balance, Decreased endurance, Decreased mobility, Decreased strength, Postural dysfunction, Prosthetic Dependency  Visit Diagnosis: 1. Other abnormalities of gait and mobility   2. Unsteadiness on feet   3. Muscle weakness (generalized)   4. Abnormal posture   5. History of falling        Problem List Patient Active Problem List   Diagnosis Date Noted  . Acute metabolic encephalopathy 59/45/8592  .  Stroke ([place]) 10/28/2018  . Middle cerebral artery embolism, left 10/28/2018  . Metabolic acidosis 29/01/300  . Dehydration 05/24/2018  . Pressure injury of skin 05/24/2018  . Dyspnea 05/23/2018  . Acute respiratory failure with hypoxia ([place]) 05/23/2018  . Abnormal cardiovascular stress test 12/29/2013  . Awaiting organ transplant 12/09/2013  . Leg ulcer ([place]) 11/17/2013  . Chronic kidney disease (CKD), stage V ([place]) 11/17/2013  . Diabetes ([place]) 11/17/2013  . Cancer of duodenum ([place]) 11/17/2013  . Dyslipidemia 11/17/2013  . Chronic kidney disease requiring chronic dialysis ([place]) 11/17/2013  . Hypercholesteremia 11/17/2013  . H/O malignant neoplasm 11/17/2013  . H/O: HTN (hypertension) 11/17/2013  . BP (high blood pressure) 11/17/2013  . Hypotension 11/17/2013  . Angiopathy, peripheral ([place]) 11/17/2013  . AF (paroxysmal atrial fibrillation) ([place]) 11/17/2013  . BKA stump complication ([place]) 49/96/9249  . S/P bilateral BKA (below knee amputation) ([place]) 08/03/2013  . S/P BKA (below knee amputation) bilateral ([place]) 07/30/2013  . Open wound of both legs with complication 32/41/9914  . Acute blood loss anemia 07/12/2013  . Syncope 07/09/2013  . Intolerance to CAPD peritoneal dialysis s/p CAPD cath removal 07/09/2013 07/05/2013  . Critical lower limb ischemia 06/30/2013  . Extremity pain 06/30/2013  . Atherosclerosis of native arteries of the extremities with ulceration(440.23) 06/17/2013  . Gait disorder 05/31/2013  . Weakness  05/24/2013  . Depression 04/03/2013  . GERD (gastroesophageal reflux disease) 04/03/2013  . Atrial fibrillation ([place]) 04/02/2013  . DM (diabetes mellitus) ([place]) 12/28/2012  . Other complications due to renal dialysis device, implant, and graft 05/19/2012  . Diabetes mellitus, type 2 ([place]) 11/13/2011  . Anemia 07/02/2011  . ESRD on hemodialysis ([place]) 05/29/2011    Jilene Spohr PT, DPT 11/19/2018, 10:33 AM  [facility] [address] [place], [place], [address] Phone: [phone]   Fax:  [phone]  Name: TEON HUDNALL MRN: [id number] Date of Birth: Mar 07, 1956

## 2018-11-24 ENCOUNTER — Other Ambulatory Visit (HOSPITAL_COMMUNITY): Payer: Self-pay | Admitting: *Deleted

## 2018-11-25 ENCOUNTER — Other Ambulatory Visit: Payer: Self-pay

## 2018-11-25 ENCOUNTER — Ambulatory Visit (HOSPITAL_COMMUNITY)
Admission: RE | Admit: 2018-11-25 | Discharge: 2018-11-25 | Disposition: A | Discharge status: Home / Self Care | Payer: Managed Care, Other (non HMO) | Source: Ambulatory Visit | Attending: Nephrology | Admitting: Nephrology

## 2018-11-25 VITALS — BP 140/56 | HR 51 | Temp 97.2°F | Resp 18

## 2018-11-25 DIAGNOSIS — D638 Anemia in other chronic diseases classified elsewhere: Secondary | ICD-10-CM | POA: Insufficient documentation

## 2018-11-25 LAB — POCT HEMOGLOBIN-HEMACUE: Hemoglobin: 8.9 g/dL — ABNORMAL LOW (ref 13.0–17.0)

## 2018-11-25 MED ORDER — DARBEPOETIN ALFA 25 MCG/0.42ML IJ SOSY
PREFILLED_SYRINGE | INTRAMUSCULAR | Status: AC
  Filled 2018-11-25: qty 0.42

## 2018-11-25 MED ORDER — DARBEPOETIN ALFA 200 MCG/0.4ML IJ SOSY
PREFILLED_SYRINGE | INTRAMUSCULAR | Status: AC
  Administered 2018-11-25: 25 ug
  Filled 2018-11-25: qty 0.4

## 2018-11-25 MED ORDER — DARBEPOETIN ALFA 300 MCG/0.6ML IJ SOSY
225.0000 ug | PREFILLED_SYRINGE | INTRAMUSCULAR | Status: DC
  Administered 2018-11-25: 13:00:00 200 ug via SUBCUTANEOUS

## 2018-11-26 ENCOUNTER — Encounter: Payer: Self-pay | Admitting: Physical Therapy

## 2018-11-26 ENCOUNTER — Ambulatory Visit: Payer: Managed Care, Other (non HMO) | Admitting: Physical Therapy

## 2018-11-26 ENCOUNTER — Other Ambulatory Visit: Payer: Self-pay

## 2018-11-26 ENCOUNTER — Other Ambulatory Visit: Payer: Managed Care, Other (non HMO)

## 2018-11-26 DIAGNOSIS — R2681 Unsteadiness on feet: Secondary | ICD-10-CM

## 2018-11-26 DIAGNOSIS — R2689 Other abnormalities of gait and mobility: Secondary | ICD-10-CM

## 2018-11-26 DIAGNOSIS — M6281 Muscle weakness (generalized): Secondary | ICD-10-CM

## 2018-11-26 DIAGNOSIS — R293 Abnormal posture: Secondary | ICD-10-CM

## 2018-11-26 DIAGNOSIS — Z9181 History of falling: Secondary | ICD-10-CM

## 2018-11-26 MED FILL — Darbepoetin Alfa Soln Prefilled Syringe 25 MCG/0.42ML: INTRAMUSCULAR | Qty: 0.42 | Status: AC

## 2018-11-27 NOTE — Therapy (Signed)
[facility] [address] [place], [place], [address] Phone: [phone]   Fax:  [phone]  Physical Therapy Treatment  Patient Details  Name: Paul [name] MRN: [id number] Date of Birth: 1956/02/02 Referring Provider (PT): Paul Paul Cooler, MD   Encounter Date: 11/26/2018   CLINIC OPERATION CHANGES: [facility] is open at lower capacity following universal masking, social distancing, and patient screening.  The patient's COVID risk of complications score is 6.   PT End of Session - 11/26/18 1656    Visit Number  2    Number of Visits  25    Date for PT Re-Evaluation  02/16/19    Authorization Type  [employer] Medicare Part B    PT Start Time  1600    PT Stop Time  1650    PT Time Calculation (min)  50 min    Equipment Utilized During Treatment  Gait belt    Activity Tolerance  Patient tolerated treatment well;Patient limited by fatigue    Behavior During Therapy  WFL for tasks assessed/performed       Past Medical History:  Diagnosis Date  . A-fib ([place])   . Anemia    esrd  . Calciphylaxis 05/2013   lower ext  . Cancer ([place])    hx duodenal adenoCa 2002, resected  . Closed left arm fracture 1967  . Corneal abrasion, left   . Depression   . Diabetes mellitus    Type 1 iddm x 11 yrs  . Diabetic neuropathy ([place])   . ESRD on hemodialysis ([place])    Pt has ESRD due to DM.  He had a L upper arm AVF prior to starting HD which was ligated due to L arm steal syndrome.  He started HD in Jan 2014 and did home HD.  The family couldn't cannulate the R arm AVF successfully so by mid 2014 they decided to switch to PD which was done late summer 2014.  In Jan 2015 he was admitted with FTT felt to be due to underdialysis on PD and PD was abandoned and he   . GERD (gastroesophageal reflux disease)   . Glomerulosclerosis, diabetic ([place])   . Heart murmur   . History of blood transfusion   . Hypertension    off of  meds due to orthostatic hypotension  . Kidney transplant recipient   . Open wound of both legs with complication    Pt has had progressive wounds of both LE's including gangrene of the toes and patchy necrosis of the calves.  He has been treated at [facility] by Dr Paul [name] with hyperbaric O2 5d per week.  He is getting Na Thiosulfate with HD for suspected calciphylaxis. Pt says doctor's aren't sure if these ulcers were diabetic ulcers or calciphylaxis.  He underwent bilat BKA on 07/30/13.  He says that the woun    Past Surgical History:  Procedure Laterality Date  . AMPUTATION Bilateral 07/30/2013   Procedure: AMPUTATION BELOW KNEE;  Surgeon: Paul Minion, MD;  Location: [facility];  Service: Orthopedics;  Laterality: Bilateral;  Bilateral Below Knee Amputations  . AMPUTATION Bilateral 09/03/2013   Procedure: AMPUTATION BELOW KNEE;  Surgeon: Paul Minion, MD;  Location: [facility];  Service: Orthopedics;  Laterality: Bilateral;  Bilateral Below Knee Amputation Revision  . AV FISTULA PLACEMENT  06/07/2011   Procedure: ARTERIOVENOUS (AV) FISTULA CREATION;  Surgeon: Paul Mould, MD;  Location: [facility];  Service: Vascular;  Laterality: Left;  .  AV FISTULA PLACEMENT  06/18/2012   Procedure: ARTERIOVENOUS (AV) FISTULA CREATION;  Surgeon: Paul Misty, MD;  Location: [facility];  Service: Vascular;  Laterality: Right;  [place]  . BILIARY DIVERSION, EXTERNAL  2002  . BONE MARROW BIOPSY  2012  . CAPD INSERTION N/A 01/14/2013   Procedure: LAPAROSCOPIC INSERTION CONTINUOUS AMBULATORY PERITONEAL DIALYSIS  (CAPD) CATHETER;  Surgeon: Paul Hector, MD;  Location: [facility];  Service: General;  Laterality: N/A;  . CAPD REMOVAL N/A 07/09/2013   Procedure: CONTINUOUS AMBULATORY PERITONEAL DIALYSIS  (CAPD) CATHETER REMOVAL;  Surgeon: Paul Hector, MD;  Location: [facility];  Service: General;  Laterality: N/A;  . COLONOSCOPY     Hx: of  . INSERTION OF DIALYSIS CATHETER  05/22/2012   Procedure: INSERTION OF DIALYSIS CATHETER;   Surgeon: Paul Misty, MD;  Location: [facility];  Service: Vascular;  Laterality: N/A;  right internal jugular vein  . IR ANGIO INTRA EXTRACRAN SEL COM CAROTID INNOMINATE BILAT MOD SED  10/28/2018  . IR ANGIO VERTEBRAL SEL SUBCLAVIAN INNOMINATE UNI R MOD SED  10/28/2018  . IR ANGIO VERTEBRAL SEL VERTEBRAL UNI L MOD SED  10/28/2018  . LIGATION OF ARTERIOVENOUS  FISTULA  05/22/2012   Procedure: LIGATION OF ARTERIOVENOUS  FISTULA;  Surgeon: Paul Misty, MD;  Location: [facility];  Service: Vascular;  Laterality: Left;  Left brachio-cephalic fistula  . LOWER EXTREMITY ANGIOGRAM N/A 06/14/2013   Procedure: LOWER EXTREMITY ANGIOGRAM;  Surgeon: Paul Mould, MD;  [facility];  Service: Cardiovascular;  Laterality: N/A;  . NEPHRECTOMY TRANSPLANTED ORGAN    . OTHER SURGICAL HISTORY     Cyst removed from back  . PORTACATH PLACEMENT  2002  . RADIOLOGY WITH ANESTHESIA N/A 10/28/2018   Procedure: IR WITH ANESTHESIA;  Surgeon: Radiologist, Medication, MD;  Location: [facility];  Service: Radiology;  Laterality: N/A;  . REMOVAL OF A DIALYSIS CATHETER    . RENAL BIOPSY, PERCUTANEOUS  2012  . TONSILLECTOMY    . VASCULAR SURGERY    . WHIPPLE PROCEDURE  2002   duodenal ca, Dr. Harlow [name]    There were no vitals filed for this visit.  Subjective Assessment - 11/26/18 1603    Subjective  No falls. or issues.    Patient is accompained by:  Family member   wife, Paul [name]   Pertinent History  HTN, DM, renal transplant 05/23/2014, CKD st IV, calciphylaxis, A-Fib, bil. TTAs 07/30/2013,    Patient Stated Goals  To stop falls, get stronger, negotiate stairs better, to return to prior functional level, walk in/out of office safely without fatigue issues.    Currently in [name]?  No/denies                       [facility] Adult PT Treatment/Exercise - 11/26/18 1600      Transfers   Transfers  Sit to Stand;Stand to Sit    Sit to Stand  5: Supervision;With upper extremity assist;With armrests;From  chair/3-in-1   uses back of legs against chair to stabilze   Stand to Sit  5: Supervision;With upper extremity assist;With armrests;To chair/3-in-1   uses back of legs against chair to control     Ambulation/Gait   Ambulation/Gait  Yes    Ambulation/Gait Assistance  5: Supervision    Ambulation/Gait Assistance Details  cues on upright posture    Ambulation Distance (Feet)  200 Feet    Assistive device  Prostheses;Straight cane;None   straight cane quad tip  Gait Pattern  Step-through pattern;Decreased arm swing - right;Decreased stance time - right;Antalgic;Trunk flexed;Wide base of support    Ambulation Surface  Level;Indoor;Outdoor;Paved    Gait velocity  --    Stairs  --    Stairs Assistance  --    Stair Management Technique  --    Number of Stairs  --      Posture/Postural Control   Posture/Postural Control  --    Postural Limitations  --      Prosthetics   Current prosthetic wear tolerance (days/week)   daily since hospital discharge 6/17    Current prosthetic wear tolerance (#hours/day)   >90% of time out of bed but spends increased time in bed since hospitalization    Current prosthetic weight-bearing tolerance (hours/day)   tolerated 5 minutes standing activities with no [name] but fatigues.     Edema  --    Residual limb condition   --          Balance Exercises - 11/26/18 1600      OTAGO PROGRAM   Head Movements  Sitting;5 reps    Neck Movements  Sitting;5 reps    Back Extension  Standing;5 reps   BUE support counter   Trunk Movements  Standing;5 reps   single UE support counter   Knee Extensor  10 reps    Knee Flexor  10 reps   BUE support counter   Hip ABductor  10 reps   BUE support counter   Knee Bends  10 reps, support   modifed squat, BUE support counter, chair behind for safety   Backwards Walking  Support   UE support counter & cane   Sideways Walking  Assistive device   BUE support counter   Tandem Stance  10 seconds, support   modified to  forward step no tandem, intermittent support   One Leg Stand  10 seconds, support   modified with forefoot in lower cabinet, reaching single UE   Sit to Stand  5 reps, bilateral support    Overall OTAGO Comments  added standing hip extension with BUE support counter, hamstring & gastroc (remainder) stretch        PT Education - 11/26/18 1644    Education Details  OTAGO HEP with modifications with bil. TTA prostheses    Person(s) Educated  Patient   verbally reviewed with wife end of session   Methods  Explanation;Demonstration;Tactile cues;Verbal cues;Handout    Comprehension  Verbalized understanding;Returned demonstration       PT Short Term Goals - 11/19/18 1029      PT SHORT TERM GOAL #1   Title  Patient demonstrates understanding of initial HEP. (All STGs Target Date: 12/18/2018)    Time  4    Period  Weeks    Status  New    Target Date  12/18/18      PT SHORT TERM GOAL #2   Title  Patient ambulates 300' with cane & prostheses with supervision.    Time  4    Period  Weeks    Status  New    Target Date  12/18/18      PT SHORT TERM GOAL #3   Title  Patient negotiates stairs with 1 rail & cane with minA.    Time  4    Period  Weeks    Status  New    Target Date  12/18/18      PT SHORT TERM GOAL #4   Title  Berg Balance >/=  36/56    Time  4    Period  Weeks    Status  New    Target Date  12/18/18        PT Long Term Goals - 11/19/18 1019      PT LONG TERM GOAL #1   Title  Patient demonstrates & verbalizes understanding of ongoing HEP. (All LTGs Target Date: 02/12/2019)    Time  12    Period  Weeks    Status  New    Target Date  02/12/19      PT LONG TERM GOAL #2   Title  Berg Balance >45/56 to indicate lower fall risk.    Time  12    Period  Weeks    Status  New    Target Date  02/12/19      PT LONG TERM GOAL #3   Title  Timed Up & Go <13.5sec & cognitive TUG <15.5sec without device except prostheses safely to indicate lower fall risk.    Time  12     Period  Weeks    Status  New    Target Date  02/12/19      PT LONG TERM GOAL #4   Title  Dynamic Gait Index >/= 19/24 with cane to indicate lower fall risk.    Time  12    Period  Weeks    Status  New    Target Date  02/12/19      PT LONG TERM GOAL #5   Title  Patient ambulates 500' with cane & prostheses modified independent for community mobility.    Time  12    Period  Weeks    Status  New    Target Date  02/12/19      PT LONG TERM GOAL #6   Title  Patient negotiates stairs 1 rail, ramps & curbs with cane & prostheses modified independent  for community access.    Time  12    Period  Weeks    Status  New    Target Date  02/12/19      PT LONG TERM GOAL #7   Title  Patient ambulates 100' around furniture with prostheses only modified independent.    Time  12    Period  Weeks    Status  New    Target Date  02/12/19            Plan - 11/26/18 1820    Clinical Impression Statement  Today's skilled session focused on introducing OTAGO as HEP. PT recommended breaking exercises into 4-5 at time working thru full program and he seems to understand.    Personal Factors and Comorbidities  Comorbidity 3+;Fitness    Comorbidities  TIA/encephalopathy, HTN, DM, renal transplant, CKD st IV, calciphylaxis, A-Fib, bil. TTAs    Examination-Activity Limitations  Caring for Others;Carry;Locomotion Level;Stairs;Stand;Transfers    Examination-Participation Restrictions  Community Activity;Driving    Stability/Clinical Decision Making  Evolving/Moderate complexity    Rehab Potential  Good    PT Frequency  2x / week    PT Duration  12 weeks    PT Treatment/Interventions  ADLs/Self Care Home Management;DME Instruction;Gait training;Stair training;Functional mobility training;Therapeutic activities;Therapeutic exercise;Balance training;Neuromuscular re-education;Patient/family education;Prosthetic Training    PT Next Visit Plan  check on OTAGO HEP, balance & gait training to build  endurance & strength    Consulted and Agree with Plan of Care  Patient       Patient will benefit from skilled therapeutic intervention  in order to improve the following deficits and impairments:  Abnormal gait, Decreased activity tolerance, Decreased balance, Decreased endurance, Decreased mobility, Decreased strength, Postural dysfunction, Prosthetic Dependency  Visit Diagnosis: 1. Unsteadiness on feet   2. Other abnormalities of gait and mobility   3. Muscle weakness (generalized)   4. Abnormal posture   5. History of falling        Problem List Patient Active Problem List   Diagnosis Date Noted  . Acute metabolic encephalopathy 24/82/5003  . Stroke ([place]) 10/28/2018  . Middle cerebral artery embolism, left 10/28/2018  . Metabolic acidosis 70/48/8891  . Dehydration 05/24/2018  . Pressure injury of skin 05/24/2018  . Dyspnea 05/23/2018  . Acute respiratory failure with hypoxia ([place]) 05/23/2018  . Abnormal cardiovascular stress test 12/29/2013  . Awaiting organ transplant 12/09/2013  . Leg ulcer ([place]) 11/17/2013  . Chronic kidney disease (CKD), stage V ([place]) 11/17/2013  . Diabetes ([place]) 11/17/2013  . Cancer of duodenum ([place]) 11/17/2013  . Dyslipidemia 11/17/2013  . Chronic kidney disease requiring chronic dialysis ([place]) 11/17/2013  . Hypercholesteremia 11/17/2013  . H/O malignant neoplasm 11/17/2013  . H/O: HTN (hypertension) 11/17/2013  . BP (high blood pressure) 11/17/2013  . Hypotension 11/17/2013  . Angiopathy, peripheral ([place]) 11/17/2013  . AF (paroxysmal atrial fibrillation) ([place]) 11/17/2013  . BKA stump complication ([place]) 69/45/0388  . S/P bilateral BKA (below knee amputation) ([place]) 08/03/2013  . S/P BKA (below knee amputation) bilateral ([place]) 07/30/2013  . Open wound of both legs with complication 82/80/0349  . Acute blood loss anemia 07/12/2013  . Syncope 07/09/2013  . Intolerance to CAPD peritoneal dialysis s/p CAPD cath removal 07/09/2013 07/05/2013  .  Critical lower limb ischemia 06/30/2013  . Extremity [name] 06/30/2013  . Atherosclerosis of native arteries of the extremities with ulceration(440.23) 06/17/2013  . Gait disorder 05/31/2013  . Weakness 05/24/2013  . Depression 04/03/2013  . GERD (gastroesophageal reflux disease) 04/03/2013  . Atrial fibrillation ([place]) 04/02/2013  . DM (diabetes mellitus) ([place]) 12/28/2012  . Other complications due to renal dialysis device, implant, and graft 05/19/2012  . Diabetes mellitus, type 2 ([place]) 11/13/2011  . Anemia 07/02/2011  . ESRD on hemodialysis ([place]) 05/29/2011    Alexsis Kathman PT, DPT 11/27/2018, 1:23 PM  [facility] [address] [place], [place], [address] Phone: [phone]   Fax:  [phone]  Name: Paul [name] MRN: [id number] Date of Birth: [birth year]

## 2018-12-03 ENCOUNTER — Encounter

## 2018-12-03 ENCOUNTER — Ambulatory Visit: Payer: Managed Care, Other (non HMO) | Admitting: Physical Therapy

## 2018-12-08 ENCOUNTER — Other Ambulatory Visit: Payer: Self-pay

## 2018-12-09 ENCOUNTER — Encounter (HOSPITAL_COMMUNITY): Payer: Managed Care, Other (non HMO)

## 2018-12-09 ENCOUNTER — Ambulatory Visit (HOSPITAL_COMMUNITY)
Admission: RE | Admit: 2018-12-09 | Discharge: 2018-12-09 | Disposition: A | Discharge status: Home / Self Care | Payer: Managed Care, Other (non HMO) | Source: Ambulatory Visit | Attending: Nephrology | Admitting: Nephrology

## 2018-12-09 VITALS — BP 180/68 | HR 50 | Temp 97.3°F | Resp 18

## 2018-12-09 DIAGNOSIS — D638 Anemia in other chronic diseases classified elsewhere: Secondary | ICD-10-CM

## 2018-12-09 LAB — IRON AND TIBC
Iron: 60 ug/dL (ref 45–182)
Saturation Ratios: 29 % (ref 17.9–39.5)
TIBC: 207 ug/dL — ABNORMAL LOW (ref 250–450)
UIBC: 147 ug/dL

## 2018-12-09 LAB — FERRITIN: Ferritin: 184 ng/mL (ref 24–336)

## 2018-12-09 MED ORDER — DARBEPOETIN ALFA 25 MCG/0.42ML IJ SOSY
PREFILLED_SYRINGE | INTRAMUSCULAR | Status: AC
  Administered 2018-12-09: 25 ug via SUBCUTANEOUS
  Filled 2018-12-09: qty 0.42

## 2018-12-09 MED ORDER — DARBEPOETIN ALFA 200 MCG/0.4ML IJ SOSY
PREFILLED_SYRINGE | INTRAMUSCULAR | Status: AC
  Administered 2018-12-09: 200 ug via SUBCUTANEOUS
  Filled 2018-12-09: qty 0.4

## 2018-12-09 MED ORDER — DARBEPOETIN ALFA 300 MCG/0.6ML IJ SOSY: 225.0000 ug | PREFILLED_SYRINGE | INTRAMUSCULAR | Status: DC

## 2018-12-10 ENCOUNTER — Ambulatory Visit: Payer: Managed Care, Other (non HMO) | Admitting: Physical Therapy

## 2018-12-10 ENCOUNTER — Encounter: Payer: Self-pay | Admitting: Physical Therapy

## 2018-12-10 DIAGNOSIS — M6281 Muscle weakness (generalized): Secondary | ICD-10-CM | POA: Diagnosis not present

## 2018-12-10 DIAGNOSIS — R293 Abnormal posture: Secondary | ICD-10-CM

## 2018-12-10 DIAGNOSIS — R2681 Unsteadiness on feet: Secondary | ICD-10-CM

## 2018-12-10 DIAGNOSIS — R2689 Other abnormalities of gait and mobility: Secondary | ICD-10-CM | POA: Diagnosis not present

## 2018-12-10 DIAGNOSIS — Z9181 History of falling: Secondary | ICD-10-CM | POA: Diagnosis not present

## 2018-12-10 LAB — POCT HEMOGLOBIN-HEMACUE: Hemoglobin: 11.1 g/dL — ABNORMAL LOW (ref 13.0–17.0)

## 2018-12-10 NOTE — Therapy (Signed)
Paul [name] [address] [employer] [place], [place], [address] Phone: [phone]   Fax:  [phone]  Physical Therapy Treatment  Patient Details  Name: Paul [name] MRN: [id number] Date of Birth: 12-05-1955 Referring Provider (PT): Dibas Dorthy Cooler, MD   Encounter Date: 12/10/2018   CLINIC OPERATION CHANGES: [facility]patient [facility] is open at lower capacity following universal masking, social distancing, and patient screening.  The patient's COVID risk of complications score is 6.   PT End of Session - 12/10/18 2233    Visit Number  3    Number of Visits  25    Date for PT Re-Evaluation  02/16/19    Authorization Type  [employer] Medicare Part B    PT Start Time  1806    PT Stop Time  1849    PT Time Calculation (min)  43 min    Equipment Utilized During Treatment  Gait belt    Activity Tolerance  Patient tolerated treatment well;Patient limited by fatigue    Behavior During Therapy  WFL for tasks assessed/performed       Past Medical History:  Diagnosis Date  . A-fib ([place])   . Anemia    esrd  . Calciphylaxis 05/2013   lower ext  . Cancer ([place])    hx duodenal adenoCa 2002, resected  . Closed left arm fracture 1967  . Corneal abrasion, left   . Depression   . Diabetes mellitus    Type 1 iddm x 11 yrs  . Diabetic neuropathy ([place])   . ESRD on hemodialysis ([place])    Pt has ESRD due to DM.  He had a L upper arm AVF prior to starting HD which was ligated due to L arm steal syndrome.  He started HD in Jan 2014 and did home HD.  The family couldn't cannulate the R arm AVF successfully so by mid 2014 they decided to switch to PD which was done late summer 2014.  In Jan 2015 he was admitted with FTT felt to be due to underdialysis on PD and PD was abandoned and he   . GERD (gastroesophageal reflux disease)   . Glomerulosclerosis, diabetic ([place])   . Heart murmur   . History of blood transfusion   . Hypertension    off of  meds due to orthostatic hypotension  . Kidney transplant recipient   . Open wound of both legs with complication    Pt has had progressive wounds of both LE's including gangrene of the toes and patchy necrosis of the calves.  He has been treated at [facility] by Dr Jerline Pain with hyperbaric O2 5d per week.  He is getting Na Thiosulfate with HD for suspected calciphylaxis. Pt says doctor's aren't sure if these ulcers were diabetic ulcers or calciphylaxis.  He underwent bilat BKA on 07/30/13.  He says that the woun    Past Surgical History:  Procedure Laterality Date  . AMPUTATION Bilateral 07/30/2013   Procedure: AMPUTATION BELOW KNEE;  Surgeon: Newt Minion, MD;  Location: [facility];  Service: Orthopedics;  Laterality: Bilateral;  Bilateral Below Knee Amputations  . AMPUTATION Bilateral 09/03/2013   Procedure: AMPUTATION BELOW KNEE;  Surgeon: Newt Minion, MD;  Location: [facility];  Service: Orthopedics;  Laterality: Bilateral;  Bilateral Below Knee Amputation Revision  . AV FISTULA PLACEMENT  06/07/2011   Procedure: ARTERIOVENOUS (AV) FISTULA CREATION;  Surgeon: Angelia Mould, MD;  Location: [facility];  Service: Vascular;  Laterality: Left;  .  AV FISTULA PLACEMENT  06/18/2012   Procedure: ARTERIOVENOUS (AV) FISTULA CREATION;  Surgeon: Mal Misty, MD;  Location: [facility];  Service: Vascular;  Laterality: Right;  [place]  . BILIARY DIVERSION, EXTERNAL  2002  . BONE MARROW BIOPSY  2012  . CAPD INSERTION N/A 01/14/2013   Procedure: LAPAROSCOPIC INSERTION CONTINUOUS AMBULATORY PERITONEAL DIALYSIS  (CAPD) CATHETER;  Surgeon: Adin Hector, MD;  Location: [facility];  Service: General;  Laterality: N/A;  . CAPD REMOVAL N/A 07/09/2013   Procedure: CONTINUOUS AMBULATORY PERITONEAL DIALYSIS  (CAPD) CATHETER REMOVAL;  Surgeon: Adin Hector, MD;  Location: [facility];  Service: General;  Laterality: N/A;  . COLONOSCOPY     Hx: of  . INSERTION OF DIALYSIS CATHETER  05/22/2012   Procedure: INSERTION OF DIALYSIS CATHETER;   Surgeon: Mal Misty, MD;  Location: [employer];  Service: Vascular;  Laterality: N/A;  right internal jugular vein  . IR ANGIO INTRA EXTRACRAN SEL COM CAROTID INNOMINATE BILAT MOD SED  10/28/2018  . IR ANGIO VERTEBRAL SEL SUBCLAVIAN INNOMINATE UNI R MOD SED  10/28/2018  . IR ANGIO VERTEBRAL SEL VERTEBRAL UNI L MOD SED  10/28/2018  . LIGATION OF ARTERIOVENOUS  FISTULA  05/22/2012   Procedure: LIGATION OF ARTERIOVENOUS  FISTULA;  Surgeon: Mal Misty, MD;  Location: [facility];  Service: Vascular;  Laterality: Left;  Left brachio-cephalic fistula  . LOWER EXTREMITY ANGIOGRAM N/A 06/14/2013   Procedure: LOWER EXTREMITY ANGIOGRAM;  Surgeon: Angelia Mould, MD;  Location: [facility] CATH LAB;  Service: Cardiovascular;  Laterality: N/A;  . NEPHRECTOMY TRANSPLANTED ORGAN    . OTHER SURGICAL HISTORY     Cyst removed from back  . PORTACATH PLACEMENT  2002  . RADIOLOGY WITH ANESTHESIA N/A 10/28/2018   Procedure: IR WITH ANESTHESIA;  Surgeon: Radiologist, Medication, MD;  Location: [facility];  Service: Radiology;  Laterality: N/A;  . REMOVAL OF A DIALYSIS CATHETER    . RENAL BIOPSY, PERCUTANEOUS  2012  . TONSILLECTOMY    . VASCULAR SURGERY    . WHIPPLE PROCEDURE  2002   duodenal ca, Dr. Harlow Asa    There were no vitals filed for this visit.  Subjective Assessment - 12/10/18 1805    Subjective  He went to his beach house. He was able to walk in hospital for injection yesterday but it elevated his BP some. No falls. He has not been doing his exercises except neck rotation. He plans to begin now that back from beach.    Patient is accompained by:  Family member   wife, Adolpho Meenach   Pertinent History  HTN, DM, renal transplant 05/23/2014, CKD st IV, calciphylaxis, A-Fib, bil. TTAs 07/30/2013,    Patient Stated Goals  To stop falls, get stronger, negotiate stairs better, to return to prior functional level, walk in/out of office safely without fatigue issues.    Currently in Pain?  No/denies         [facility] PT  Assessment - 12/10/18 1805      Assessment   Medical Diagnosis  TIA / encephalopathy & deconditioning      6 Minute Walk- Baseline   6 Minute Walk- Baseline  yes    BP (mmHg)  177/71    HR (bpm)  52    02 Sat (%RA)  98 %      6 Minute walk- Post Test   6 Minute Walk Post Test  yes    BP (mmHg)  190/77    HR (bpm)  57  02 Sat (%RA)  95 %    Modified Borg Scale for Dyspnea  6-   respirations 22   Perceived Rate of Exertion (Borg)  13- Somewhat hard      6 minute walk test results    Aerobic Endurance Distance Walked  911    Endurance additional comments  No rest breaks,  115' lap 30sec initially & 40sec at end.                   [place] [facility] Adult PT Treatment/Exercise - 12/10/18 1800      Transfers   Transfers  Sit to Stand;Stand to Sit    Sit to Stand  5: Supervision;With upper extremity assist;With armrests;From chair/3-in-1   uses back of legs against chair to stabilze   Stand to Sit  5: Supervision;With upper extremity assist;With armrests;To chair/3-in-1   uses back of legs against chair to control     Ambulation/Gait   Ambulation/Gait  Yes    Ambulation/Gait Assistance  5: Supervision    Ambulation Distance (Feet)  911 Feet    Assistive device  Prostheses;Straight cane;None   straight cane quad tip   Gait Pattern  Step-through pattern;Decreased arm swing - right;Decreased stance time - right;Antalgic;Trunk flexed;Wide base of support    Ambulation Surface  Indoor;Level      High Level Balance   High Level Balance Activities  Side stepping;Backward walking   parallel bars BUE support cues to minimize wt bearing   High Level Balance Comments  demo & verbal cues on technique & wt shift      Knee/Hip Exercises: Standing   Lateral Step Up  Right;Left;5 reps;Hand Hold: 2;Step Height: 4"   parallel bars BUE support to minimize wt bearing   Lateral Step Up Limitations  demo & verbal cues on wt shift & upright posture    Forward Step Up  Right;Left;5 reps;Hand  Hold: 2;Step Height: 4"   parallel bars BUE support to minimize wt bearing   Forward Step Up Limitations  demo & verbal cues on wt shift & upright posture    Step Down  Right;Left;5 reps;Hand Hold: 2;Step Height: 4"   parallel bars BUE support to minimize wt bearing   Step Down Limitations  demo & verbal cues on wt shift & upright posture      Prosthetics   Current prosthetic wear tolerance (days/week)   daily since hospital discharge 6/17    Current prosthetic wear tolerance (#hours/day)   >90% of time out of bed but spends increased time in bed since hospitalization    Current prosthetic weight-bearing tolerance (hours/day)   tolerated 5 minutes standing activities with no pain but fatigues.              PT Education - 12/10/18 1830    Education Details  Progressive Walking Program (see pt instructions)    Person(s) Educated  Patient;Spouse    Methods  Explanation;Verbal cues    Comprehension  Verbalized understanding;Need further instruction       PT Short Term Goals - 12/10/18 2234      PT SHORT TERM GOAL #1   Title  Patient demonstrates understanding of initial HEP. (All STGs Target Date: 12/18/2018)    Time  4    Period  Weeks    Status  New    Target Date  12/18/18      PT SHORT TERM GOAL #2   Title  Patient ambulates 300' with cane & prostheses with supervision.  Time  4    Period  Weeks    Status  On-going    Target Date  12/18/18      PT SHORT TERM GOAL #3   Title  Patient negotiates stairs with 1 rail & cane with minA.    Time  4    Period  Weeks    Status  On-going    Target Date  12/18/18      PT SHORT TERM GOAL #4   Title  Merrilee Jansky Balance >/=36/56    Time  4    Period  Weeks    Status  On-going    Target Date  12/18/18        PT Long Term Goals - 12/10/18 2234      PT LONG TERM GOAL #1   Title  Patient demonstrates & verbalizes understanding of ongoing HEP. (All LTGs Target Date: 02/12/2019)    Time  12    Period  Weeks    Status  On-going     Target Date  02/12/19      PT LONG TERM GOAL #2   Title  Berg Balance >45/56 to indicate lower fall risk.    Time  12    Period  Weeks    Status  On-going    Target Date  02/12/19      PT LONG TERM GOAL #3   Title  Timed Up & Go <13.5sec & cognitive TUG <15.5sec without device except prostheses safely to indicate lower fall risk.    Time  12    Period  Weeks    Status  On-going    Target Date  02/12/19      PT LONG TERM GOAL #4   Title  Dynamic Gait Index >/= 19/24 with cane to indicate lower fall risk.    Time  12    Period  Weeks    Status  On-going    Target Date  02/12/19      PT LONG TERM GOAL #5   Title  Patient ambulates 500' with cane & prostheses modified independent for community mobility.    Time  12    Period  Weeks    Status  On-going    Target Date  02/12/19      PT LONG TERM GOAL #6   Title  Patient negotiates stairs 1 rail, ramps & curbs with cane & prostheses modified independent  for community access.    Time  12    Period  Weeks    Status  On-going    Target Date  02/12/19      PT LONG TERM GOAL #7   Title  Patient ambulates 100' around furniture with prostheses only modified independent.    Time  12    Period  Weeks    Status  On-going    Target Date  02/12/19            Plan - 12/10/18 2235    Clinical Impression Statement  PT performed 6 Minute Walk Test to assess endurance.  Session also focused on balance with sidestepping, backwards stepping & step-ups.    Personal Factors and Comorbidities  Comorbidity 3+;Fitness    Comorbidities  TIA/encephalopathy, HTN, DM, renal transplant, CKD st IV, calciphylaxis, A-Fib, bil. TTAs    Examination-Activity Limitations  Caring for Others;Carry;Locomotion Level;Stairs;Stand;Transfers    Examination-Participation Restrictions  Community Activity;Driving    Stability/Clinical Decision Making  Evolving/Moderate complexity    Rehab Potential  Good  PT Frequency  2x / week    PT Duration  12  weeks    PT Treatment/Interventions  ADLs/Self Care Home Management;DME Instruction;Gait training;Stair training;Functional mobility training;Therapeutic activities;Therapeutic exercise;Balance training;Neuromuscular re-education;Patient/family education;Prosthetic Training    PT Next Visit Plan  check on OTAGO HEP, balance & gait training to build endurance & strength    Consulted and Agree with Plan of Care  Patient       Patient will benefit from skilled therapeutic intervention in order to improve the following deficits and impairments:  Abnormal gait, Decreased activity tolerance, Decreased balance, Decreased endurance, Decreased mobility, Decreased strength, Postural dysfunction, Prosthetic Dependency  Visit Diagnosis: 1. Other abnormalities of gait and mobility   2. Unsteadiness on feet   3. Muscle weakness (generalized)   4. Abnormal posture        Problem List Patient Active Problem List   Diagnosis Date Noted  . Acute metabolic encephalopathy 31/25/0871  . Stroke ([place]) 10/28/2018  . Middle cerebral artery embolism, left 10/28/2018  . Metabolic acidosis 99/41/2904  . Dehydration 05/24/2018  . Pressure injury of skin 05/24/2018  . Dyspnea 05/23/2018  . Acute respiratory failure with hypoxia ([place]) 05/23/2018  . Abnormal cardiovascular stress test 12/29/2013  . Awaiting organ transplant 12/09/2013  . Leg ulcer ([place]) 11/17/2013  . Chronic kidney disease (CKD), stage V ([place]) 11/17/2013  . Diabetes ([place]) 11/17/2013  . Cancer of duodenum ([place]) 11/17/2013  . Dyslipidemia 11/17/2013  . Chronic kidney disease requiring chronic dialysis ([place]) 11/17/2013  . Hypercholesteremia 11/17/2013  . H/O malignant neoplasm 11/17/2013  . H/O: HTN (hypertension) 11/17/2013  . BP (high blood pressure) 11/17/2013  . Hypotension 11/17/2013  . Angiopathy, peripheral ([place]) 11/17/2013  . AF (paroxysmal atrial fibrillation) ([place]) 11/17/2013  . BKA stump complication ([place]) 75/33/9179  . S/P  bilateral BKA (below knee amputation) ([place]) 08/03/2013  . S/P BKA (below knee amputation) bilateral ([place]) 07/30/2013  . Open wound of both legs with complication 21/78/3754  . Acute blood loss anemia 07/12/2013  . Syncope 07/09/2013  . Intolerance to CAPD peritoneal dialysis s/p CAPD cath removal 07/09/2013 07/05/2013  . Critical lower limb ischemia 06/30/2013  . Extremity pain 06/30/2013  . Atherosclerosis of native arteries of the extremities with ulceration(440.23) 06/17/2013  . Gait disorder 05/31/2013  . Weakness 05/24/2013  . Depression 04/03/2013  . GERD (gastroesophageal reflux disease) 04/03/2013  . Atrial fibrillation ([place]) 04/02/2013  . DM (diabetes mellitus) ([place]) 12/28/2012  . Other complications due to renal dialysis device, implant, and graft 05/19/2012  . Diabetes mellitus, type 2 ([place]) 11/13/2011  . Anemia 07/02/2011  . ESRD on hemodialysis ([place]) 05/29/2011    Jakari Jacot PT, DPT 12/10/2018, 10:37 PM  [facility] [address] [place], [place], [address] Phone: [phone]   Fax:  [phone]  Name: Paul [name] MRN: [id number] Date of Birth: Oct 08, 1955

## 2018-12-10 NOTE — Patient Instructions (Signed)
Walking Program with wife's supervision. Indoors with AC initially and progress to outdoors on flat ground during lower temperatures.  Start with 4 min walk, 4 min seated rest, 3 sets, Once 3rd set is not strenuous / difficult Progress to 5 min walk, 3 min seated rest 3 sets,  6 min walk, 4 min rest, 3 sets 7 min walk, 3 min rest, 3 sets 10 min walk, 5 min rest (decrease rest to 2-3 minutes), 2 sets

## 2018-12-11 DIAGNOSIS — Z7682 Awaiting organ transplant status: Secondary | ICD-10-CM | POA: Diagnosis not present

## 2018-12-11 DIAGNOSIS — I129 Hypertensive chronic kidney disease with stage 1 through stage 4 chronic kidney disease, or unspecified chronic kidney disease: Secondary | ICD-10-CM | POA: Diagnosis not present

## 2018-12-11 DIAGNOSIS — Z940 Kidney transplant status: Secondary | ICD-10-CM | POA: Diagnosis not present

## 2018-12-11 DIAGNOSIS — Z794 Long term (current) use of insulin: Secondary | ICD-10-CM | POA: Diagnosis not present

## 2018-12-11 DIAGNOSIS — E119 Type 2 diabetes mellitus without complications: Secondary | ICD-10-CM | POA: Diagnosis not present

## 2018-12-11 DIAGNOSIS — N184 Chronic kidney disease, stage 4 (severe): Secondary | ICD-10-CM | POA: Diagnosis not present

## 2018-12-11 DIAGNOSIS — Z89512 Acquired absence of left leg below knee: Secondary | ICD-10-CM | POA: Diagnosis not present

## 2018-12-11 DIAGNOSIS — Z89511 Acquired absence of right leg below knee: Secondary | ICD-10-CM | POA: Diagnosis not present

## 2018-12-11 DIAGNOSIS — T8612 Kidney transplant failure: Secondary | ICD-10-CM | POA: Diagnosis not present

## 2018-12-11 DIAGNOSIS — E1122 Type 2 diabetes mellitus with diabetic chronic kidney disease: Secondary | ICD-10-CM | POA: Diagnosis not present

## 2018-12-11 DIAGNOSIS — Z01818 Encounter for other preprocedural examination: Secondary | ICD-10-CM | POA: Diagnosis not present

## 2018-12-11 DIAGNOSIS — Z79899 Other long term (current) drug therapy: Secondary | ICD-10-CM | POA: Diagnosis not present

## 2018-12-11 DIAGNOSIS — Z9114 Patient's other noncompliance with medication regimen: Secondary | ICD-10-CM | POA: Diagnosis not present

## 2018-12-11 DIAGNOSIS — R9431 Abnormal electrocardiogram [ECG] [EKG]: Secondary | ICD-10-CM | POA: Diagnosis not present

## 2018-12-11 DIAGNOSIS — I350 Nonrheumatic aortic (valve) stenosis: Secondary | ICD-10-CM | POA: Diagnosis not present

## 2018-12-17 ENCOUNTER — Telehealth: Payer: Self-pay | Admitting: Orthopedic Surgery

## 2018-12-17 NOTE — Telephone Encounter (Signed)
[name] called requesting an RX written for liners for the patient's prosthetic legs.  CB#[phone].  Thank you.

## 2018-12-17 NOTE — Telephone Encounter (Signed)
Can you please call and make an appt for the pt? He needs to have been seen with in one year to have insurance honor rx for prosthetic supplies.

## 2018-12-17 NOTE — Telephone Encounter (Signed)
Called patient got recording cell phone mailbox is full    Left message on home phone number to call back to schedule an appointment with Junie Panning or Dr Sharol Given for prosthetic supplies

## 2018-12-18 ENCOUNTER — Other Ambulatory Visit: Payer: Self-pay

## 2018-12-18 ENCOUNTER — Ambulatory Visit: Payer: Managed Care, Other (non HMO) | Admitting: Rehabilitative and Restorative Service Providers"

## 2018-12-18 ENCOUNTER — Encounter: Payer: Self-pay | Admitting: Rehabilitative and Restorative Service Providers"

## 2018-12-18 DIAGNOSIS — R2689 Other abnormalities of gait and mobility: Secondary | ICD-10-CM | POA: Diagnosis not present

## 2018-12-18 DIAGNOSIS — R2681 Unsteadiness on feet: Secondary | ICD-10-CM

## 2018-12-18 DIAGNOSIS — R293 Abnormal posture: Secondary | ICD-10-CM

## 2018-12-18 DIAGNOSIS — Z9181 History of falling: Secondary | ICD-10-CM | POA: Diagnosis not present

## 2018-12-18 DIAGNOSIS — M6281 Muscle weakness (generalized): Secondary | ICD-10-CM | POA: Diagnosis not present

## 2018-12-18 NOTE — Therapy (Signed)
[facility] [address] [name], [place], [address] Phone: [phone]   Fax:  [phone]  Physical Therapy Treatment  Patient Details  Name: Paul [name] MRN: [id number] Date of Birth: 10-07-1955 Referring Provider (PT): [name], MD  CLINIC OPERATION CHANGES: Outpatient [facility] is open at lower capacity following universal masking, social distancing, and patient screening.  The patient's COVID risk of complications score is 6. Encounter Date: 12/18/2018  PT End of Session - 12/18/18 1316    Visit Number  4    Number of Visits  25    Date for PT Re-Evaluation  02/16/19    Authorization Type  [employer] Managed & Medicare Part B    PT Start Time  1205    PT Stop Time  1250    PT Time Calculation (min)  45 min    Equipment Utilized During Treatment  Gait belt    Activity Tolerance  Patient tolerated treatment well;Patient limited by fatigue    Behavior During Therapy  WFL for tasks assessed/performed       Past Medical History:  Diagnosis Date  . A-fib ([place])   . Anemia    esrd  . Calciphylaxis 05/2013   lower ext  . Cancer ([place])    hx duodenal adenoCa 2002, resected  . Closed left arm fracture 1967  . Corneal abrasion, left   . Depression   . Diabetes mellitus    Type 1 iddm x 11 yrs  . Diabetic neuropathy ([place])   . ESRD on hemodialysis ([place])    Pt has ESRD due to DM.  He had a L upper arm AVF prior to starting HD which was ligated due to L arm steal syndrome.  He started HD in Jan 2014 and did home HD.  The family couldn't cannulate the R arm AVF successfully so by mid 2014 they decided to switch to PD which was done late summer 2014.  In Jan 2015 he was admitted with FTT felt to be due to underdialysis on PD and PD was abandoned and he   . GERD (gastroesophageal reflux disease)   . Glomerulosclerosis, diabetic ([place])   . Heart murmur   . History of blood transfusion   . Hypertension    off of meds  due to orthostatic hypotension  . Kidney transplant recipient   . Open wound of both legs with complication    Pt has had progressive wounds of both LE's including gangrene of the toes and patchy necrosis of the calves.  He has been treated at [facility] by Dr Jerline Pain with hyperbaric O2 5d per week.  He is getting Na Thiosulfate with HD for suspected calciphylaxis. Pt says doctor's aren't sure if these ulcers were diabetic ulcers or calciphylaxis.  He underwent bilat BKA on 07/30/13.  He says that the woun    Past Surgical History:  Procedure Laterality Date  . AMPUTATION Bilateral 07/30/2013   Procedure: AMPUTATION BELOW KNEE;  Surgeon: Newt Minion, MD;  Location: [place];  Service: Orthopedics;  Laterality: Bilateral;  Bilateral Below Knee Amputations  . AMPUTATION Bilateral 09/03/2013   Procedure: AMPUTATION BELOW KNEE;  Surgeon: Newt Minion, MD;  Location: [place];  Service: Orthopedics;  Laterality: Bilateral;  Bilateral Below Knee Amputation Revision  . AV FISTULA PLACEMENT  06/07/2011   Procedure: ARTERIOVENOUS (AV) FISTULA CREATION;  Surgeon: Angelia Mould, MD;  Location: [place];  Service: Vascular;  Laterality: Left;  . AV FISTULA PLACEMENT  06/18/2012   Procedure: ARTERIOVENOUS (AV) FISTULA CREATION;  Surgeon: Mal Misty, MD;  Location: [place];  Service: Vascular;  Laterality: Right;  [place]  . BILIARY DIVERSION, EXTERNAL  2002  . BONE MARROW BIOPSY  2012  . CAPD INSERTION N/A 01/14/2013   Procedure: LAPAROSCOPIC INSERTION CONTINUOUS AMBULATORY PERITONEAL DIALYSIS  (CAPD) CATHETER;  Surgeon: Adin Hector, MD;  Location: [place];  Service: General;  Laterality: N/A;  . CAPD REMOVAL N/A 07/09/2013   Procedure: CONTINUOUS AMBULATORY PERITONEAL DIALYSIS  (CAPD) CATHETER REMOVAL;  Surgeon: Adin Hector, MD;  Location: [place];  Service: General;  Laterality: N/A;  . COLONOSCOPY     Hx: of  . INSERTION OF DIALYSIS CATHETER  05/22/2012   Procedure: INSERTION OF DIALYSIS CATHETER;   Surgeon: Mal Misty, MD;  Location: [place];  Service: Vascular;  Laterality: N/A;  right internal jugular vein  . IR ANGIO INTRA EXTRACRAN SEL COM CAROTID INNOMINATE BILAT MOD SED  10/28/2018  . IR ANGIO VERTEBRAL SEL SUBCLAVIAN INNOMINATE UNI R MOD SED  10/28/2018  . IR ANGIO VERTEBRAL SEL VERTEBRAL UNI L MOD SED  10/28/2018  . LIGATION OF ARTERIOVENOUS  FISTULA  05/22/2012   Procedure: LIGATION OF ARTERIOVENOUS  FISTULA;  Surgeon: Mal Misty, MD;  Location: [place];  Service: Vascular;  Laterality: Left;  Left brachio-cephalic fistula  . LOWER EXTREMITY ANGIOGRAM N/A 06/14/2013   Procedure: LOWER EXTREMITY ANGIOGRAM;  Surgeon: Angelia Mould, MD;  Location: [facility] LAB;  Service: Cardiovascular;  Laterality: N/A;  . NEPHRECTOMY TRANSPLANTED ORGAN    . OTHER SURGICAL HISTORY     Cyst removed from back  . PORTACATH PLACEMENT  2002  . RADIOLOGY WITH ANESTHESIA N/A 10/28/2018   Procedure: IR WITH ANESTHESIA;  Surgeon: Radiologist, Medication, MD;  Location: [place];  Service: Radiology;  Laterality: N/A;  . REMOVAL OF A DIALYSIS CATHETER    . RENAL BIOPSY, PERCUTANEOUS  2012  . TONSILLECTOMY    . VASCULAR SURGERY    . WHIPPLE PROCEDURE  2002   duodenal ca, Dr. Harlow Asa    There were no vitals filed for this visit.  Subjective Assessment - 12/18/18 1206    Subjective  No falls.  Patient has not been doing exercises as prescribed.    Pertinent History  HTN, DM, renal transplant 05/23/2014, CKD st IV, calciphylaxis, A-Fib, bil. TTAs 07/30/2013,    Patient Stated Goals  To stop falls, get stronger, negotiate stairs better, to return to prior functional level, walk in/out of office safely without fatigue issues.    Currently in Pain?  No/denies                       [facility] Adult PT Treatment/Exercise - 12/18/18 1212      Transfers   Transfers  Sit to Stand;Stand to Sit    Sit to Stand  5: Supervision    Stand to Sit  6: Modified independent (Device/Increase time)       Ambulation/Gait   Ambulation/Gait  Yes    Ambulation/Gait Assistance  5: Supervision    Ambulation/Gait Assistance Details  Cues on upright posture and more control with gait (he increases speed at times, but loses stability/ appears less safe)    Ambulation Distance (Feet)  350 Feet    Assistive device  Prostheses;Straight cane;None   with quad rip   Gait Pattern  Step-through pattern;Decreased arm swing - right;Decreased stance time - right;Antalgic;Trunk flexed;Wide base of support  Ambulation Surface  Level;Indoor      Self-Care   Self-Care  Other Self-Care Comments    Other Self-Care Comments   Discussed home office/work set up with patient using his closet and having a cramped space to get up and down.    Discussed possibilities to move work station, however barriers include other family members sleeping in or noise in the house.  Patient has room upstairs, however he cannot go up/ and down safely due to dec'd railings on landings.        Neuro Re-ed    Neuro Re-ed Details   Standing moving R foot to 4" step and then L foot to 4" step working on reducing arm support with CGA.  Sidestepping tapping to 4" step 10 reps each leg.  Sidestepping reducing UE support.  Standing with feet in stride position alternating UE reacing, standing wide BOS with "W" exercise for scapular retraction x 10 reps.        Exercises   Exercises  Other Exercises    Other Exercises   Removed prosthesis to engage in mat exercises:  Performed sidelying R and L hip abduction x 10 reps, prone press up to q-ped with CGA and then cat/cow in this position for core activation-- fatigues through arms.    Repeated quadriped with ant/posterior rocking and then alternating hip extension x 2 reps each (tender on knee caps so did not continue).  Seated on physiodisc with resisted UE movement and diagonal UE movements for core activation.               PT Short Term Goals - 12/10/18 2234      PT SHORT TERM GOAL #1    Title  Patient demonstrates understanding of initial HEP. (All STGs Target Date: 12/18/2018)    Time  4    Period  Weeks    Status  New    Target Date  12/18/18      PT SHORT TERM GOAL #2   Title  Patient ambulates 300' with cane & prostheses with supervision.    Time  4    Period  Weeks    Status  On-going    Target Date  12/18/18      PT SHORT TERM GOAL #3   Title  Patient negotiates stairs with 1 rail & cane with minA.    Time  4    Period  Weeks    Status  On-going    Target Date  12/18/18      PT SHORT TERM GOAL #4   Title  Merrilee Jansky Balance >/=36/56    Time  4    Period  Weeks    Status  On-going    Target Date  12/18/18        PT Long Term Goals - 12/10/18 2234      PT LONG TERM GOAL #1   Title  Patient demonstrates & verbalizes understanding of ongoing HEP. (All LTGs Target Date: 02/12/2019)    Time  12    Period  Weeks    Status  On-going    Target Date  02/12/19      PT LONG TERM GOAL #2   Title  Berg Balance >45/56 to indicate lower fall risk.    Time  12    Period  Weeks    Status  On-going    Target Date  02/12/19      PT LONG TERM GOAL #3   Title  Timed Up &  Go <13.5sec & cognitive TUG <15.5sec without device except prostheses safely to indicate lower fall risk.    Time  12    Period  Weeks    Status  On-going    Target Date  02/12/19      PT LONG TERM GOAL #4   Title  Dynamic Gait Index >/= 19/24 with cane to indicate lower fall risk.    Time  12    Period  Weeks    Status  On-going    Target Date  02/12/19      PT LONG TERM GOAL #5   Title  Patient ambulates 500' with cane & prostheses modified independent for community mobility.    Time  12    Period  Weeks    Status  On-going    Target Date  02/12/19      PT LONG TERM GOAL #6   Title  Patient negotiates stairs 1 rail, ramps & curbs with cane & prostheses modified independent  for community access.    Time  12    Period  Weeks    Status  On-going    Target Date  02/12/19      PT LONG  TERM GOAL #7   Title  Patient ambulates 100' around furniture with prostheses only modified independent.    Time  12    Period  Weeks    Status  On-going    Target Date  02/12/19            Plan - 12/18/18 1316    Clinical Impression Statement  In standing, the patient has decreased core activation and low power/less strength to recover from loss of balance.  PT focused on standing balance, core activation, and emphasized need to move more at home.  His current work station set up at home limits mobility t/o the day.    Comorbidities  TIA/encephalopathy, HTN, DM, renal transplant, CKD st IV, calciphylaxis, A-Fib, bil. TTAs    PT Treatment/Interventions  ADLs/Self Care Home Management;DME Instruction;Gait training;Stair training;Functional mobility training;Therapeutic activities;Therapeutic exercise;Balance training;Neuromuscular re-education;Patient/family education;Prosthetic Training    PT Next Visit Plan  CHECK SHORT TERM GOALS, check HEP, balance, gait training, endurance, core activation, strengthenin.    Consulted and Agree with Plan of Care  Patient       Patient will benefit from skilled therapeutic intervention in order to improve the following deficits and impairments:  Abnormal gait, Decreased activity tolerance, Decreased balance, Decreased endurance, Decreased mobility, Decreased strength, Postural dysfunction, Prosthetic Dependency  Visit Diagnosis: 1. Other abnormalities of gait and mobility   2. Unsteadiness on feet   3. Muscle weakness (generalized)   4. Abnormal posture        Problem List Patient Active Problem List   Diagnosis Date Noted  . Acute metabolic encephalopathy 94/76/5465  . Stroke ([place]) 10/28/2018  . Middle cerebral artery embolism, left 10/28/2018  . Metabolic acidosis 03/54/6568  . Dehydration 05/24/2018  . Pressure injury of skin 05/24/2018  . Dyspnea 05/23/2018  . Acute respiratory failure with hypoxia ([place]) 05/23/2018  . Abnormal  cardiovascular stress test 12/29/2013  . Awaiting organ transplant 12/09/2013  . Leg ulcer ([place]) 11/17/2013  . Chronic kidney disease (CKD), stage V ([place]) 11/17/2013  . Diabetes ([place]) 11/17/2013  . Cancer of duodenum ([place]) 11/17/2013  . Dyslipidemia 11/17/2013  . Chronic kidney disease requiring chronic dialysis ([place]) 11/17/2013  . Hypercholesteremia 11/17/2013  . H/O malignant neoplasm 11/17/2013  . H/O: HTN (hypertension) 11/17/2013  . BP (high  blood pressure) 11/17/2013  . Hypotension 11/17/2013  . Angiopathy, peripheral ([place]) 11/17/2013  . AF (paroxysmal atrial fibrillation) ([place]) 11/17/2013  . BKA stump complication ([place]) 76/54/6503  . S/P bilateral BKA (below knee amputation) ([place]) 08/03/2013  . S/P BKA (below knee amputation) bilateral ([place]) 07/30/2013  . Open wound of both legs with complication 54/65/6812  . Acute blood loss anemia 07/12/2013  . Syncope 07/09/2013  . Intolerance to CAPD peritoneal dialysis s/p CAPD cath removal 07/09/2013 07/05/2013  . Critical lower limb ischemia 06/30/2013  . Extremity pain 06/30/2013  . Atherosclerosis of native arteries of the extremities with ulceration(440.23) 06/17/2013  . Gait disorder 05/31/2013  . Weakness 05/24/2013  . Depression 04/03/2013  . GERD (gastroesophageal reflux disease) 04/03/2013  . Atrial fibrillation ([place]) 04/02/2013  . DM (diabetes mellitus) ([place]) 12/28/2012  . Other complications due to renal dialysis device, implant, and graft 05/19/2012  . Diabetes mellitus, type 2 ([place]) 11/13/2011  . Anemia 07/02/2011  . ESRD on hemodialysis ([place]) 05/29/2011    [place], PT 12/18/2018, 1:18 PM  [facility] [address] [place], [place], [address] Phone: [phone]   Fax:  [phone]  Name: AADIN GAUT MRN: [id number] Date of Birth: Nov 20, 1955

## 2018-12-18 NOTE — Telephone Encounter (Signed)
Patient is scheduled 12/21/2018

## 2018-12-21 ENCOUNTER — Ambulatory Visit
Payer: Managed Care, Other (non HMO) | Attending: Family Medicine | Admitting: Rehabilitative and Restorative Service Providers"

## 2018-12-21 ENCOUNTER — Ambulatory Visit (INDEPENDENT_AMBULATORY_CARE_PROVIDER_SITE_OTHER): Payer: Managed Care, Other (non HMO) | Admitting: Orthopedic Surgery

## 2018-12-21 ENCOUNTER — Encounter: Payer: Self-pay | Admitting: Orthopedic Surgery

## 2018-12-21 ENCOUNTER — Other Ambulatory Visit: Payer: Self-pay

## 2018-12-21 ENCOUNTER — Encounter: Payer: Self-pay | Admitting: Rehabilitative and Restorative Service Providers"

## 2018-12-21 VITALS — Ht 73.0 in | Wt 171.1 lb

## 2018-12-21 DIAGNOSIS — Z89512 Acquired absence of left leg below knee: Secondary | ICD-10-CM | POA: Diagnosis not present

## 2018-12-21 DIAGNOSIS — I639 Cerebral infarction, unspecified: Secondary | ICD-10-CM

## 2018-12-21 DIAGNOSIS — R2689 Other abnormalities of gait and mobility: Secondary | ICD-10-CM | POA: Diagnosis not present

## 2018-12-21 DIAGNOSIS — M6281 Muscle weakness (generalized): Secondary | ICD-10-CM

## 2018-12-21 DIAGNOSIS — Z89511 Acquired absence of right leg below knee: Secondary | ICD-10-CM

## 2018-12-21 DIAGNOSIS — R293 Abnormal posture: Secondary | ICD-10-CM | POA: Diagnosis not present

## 2018-12-21 DIAGNOSIS — R2681 Unsteadiness on feet: Secondary | ICD-10-CM | POA: Diagnosis not present

## 2018-12-21 DIAGNOSIS — Z9181 History of falling: Secondary | ICD-10-CM | POA: Insufficient documentation

## 2018-12-21 NOTE — Progress Notes (Signed)
Office Visit Note   Patient: [name]           Date of Birth: May 10, 1956           MRN: [id number] Visit Date: 12/21/2018              Requested by: [name], MD [place],  [place] [address] PCP: [name], MD  Chief Complaint  Patient presents with  . Right Leg - Follow-up  . Left Leg - Follow-up      HPI: Patient is a 63 year old gentleman who is status post bilateral below the knee amputations.  Patient has had progressive difficulty with his normal activities due to the wear and tear of the socket liner foot and ankle.  Patient's initial amputation was 5 years ago in 2015.  He states he has had 1 revision socket since that time.  Patient states he has difficulty ambulating at different cadence difficulty going up and down bleachers and stairs due to the instability with the socket foot and ankle.  Assessment & Plan: Visit Diagnoses:  1. S/P bilateral below knee amputation ([place])     Plan: Patient is given a prescription for [employer] for new socket new liners new foot and ankle.  Follow-Up Instructions: Return if symptoms worsen or fail to improve.   Ortho Exam  Patient is alert, oriented, no adenopathy, well-dressed, normal affect, normal respiratory effort. Examination patient uses a cane for ambulation he is a K3 level ambulator he ambulates at different cadence ambulates independently up and down stairs and bleachers.  Patient is unable to function a K3 level due to the wear and tear and instability with the liner socket foot and ankle.  The residual limbs are well-healed there is no cellulitis no breakdown no signs of infection his socket liners are torn there is no rotational stability with the socket.  Patient's most recent hemoglobin A1c is 13.6.  Imaging: No results found. No images are attached to the encounter.  Labs: Lab Results  Component Value Date   HGBA1C 13.6 (H) 10/29/2018   HGBA1C 7.9 (H) 05/24/2018   HGBA1C 6.7 (H) 05/25/2013   ESRSEDRATE 75 (H) 06/05/2013   REPTSTATUS 11/03/2018 FINAL 10/29/2018   GRAMSTAIN  05/24/2013    RARE WBC PRESENT,BOTH PMN AND MONONUCLEAR NO ORGANISMS SEEN Performed at [facility] Performed at [facility]  05/24/2013    RARE WBC PRESENT,BOTH PMN AND MONONUCLEAR NO ORGANISMS SEEN   CULT  10/29/2018    NO GROWTH 5 DAYS Performed at [facility], [place] [address] [place], [place] [address]    [place] 08/02/2014     Lab Results  Component Value Date   ALBUMIN 3.3 (L) 10/28/2018   ALBUMIN 3.0 (L) 05/24/2018   ALBUMIN 3.7 10/28/2016    Lab Results  Component Value Date   MG 2.3 10/31/2018   MG 1.6 (L) 10/29/2018   MG 1.1 (L) 10/28/2018   No results found for: VD25OH  No results found for: PREALBUMIN CBC EXTENDED Latest Ref Rng & Units 12/09/2018 11/25/2018 11/11/2018  WBC 4.0 - 10.5 K/uL - - -  RBC 4.22 - 5.81 MIL/uL - - -  HGB 13.0 - 17.0 g/dL 11.1(L) 8.9(L) 8.0(L)  HCT 39.0 - 52.0 % - - -  PLT 150 - 400 K/uL - - -  NEUTROABS 1.7 - 7.7 K/uL - - -  LYMPHSABS 0.7 - 4.0 K/uL - - -  Body mass index is 22.57 kg/m.  Orders:  No orders of the defined types were placed in this encounter.  No orders of the defined types were placed in this encounter.    Procedures: No procedures performed  Clinical Data: No additional findings.  ROS:  All other systems negative, except as noted in the HPI. Review of Systems  Objective: Vital Signs: Ht _0  (1.854 m)   Wt 171 lb 1.3 oz (77.6 kg)   BMI 22.57 kg/m   Specialty Comments:  No specialty comments available.  PMFS History: Patient Active Problem List   Diagnosis Date Noted  . Acute metabolic encephalopathy 37/08/8887  . Stroke ([place]) 10/28/2018  . Middle cerebral artery embolism, left 10/28/2018  . Metabolic acidosis 16/94/5038  . Dehydration 05/24/2018  . Pressure injury of skin 05/24/2018  . Dyspnea 05/23/2018  . Acute  respiratory failure with hypoxia ([place]) 05/23/2018  . Abnormal cardiovascular stress test 12/29/2013  . Awaiting organ transplant 12/09/2013  . Leg ulcer ([name]) 11/17/2013  . Chronic kidney disease (CKD), stage V ([place]) 11/17/2013  . Diabetes ([place]) 11/17/2013  . Cancer of duodenum ([place]) 11/17/2013  . Dyslipidemia 11/17/2013  . Chronic kidney disease requiring chronic dialysis ([place]) 11/17/2013  . Hypercholesteremia 11/17/2013  . H/O malignant neoplasm 11/17/2013  . H/O: HTN (hypertension) 11/17/2013  . BP (high blood pressure) 11/17/2013  . Hypotension 11/17/2013  . Angiopathy, peripheral ([place]) 11/17/2013  . AF (paroxysmal atrial fibrillation) ([place]) 11/17/2013  . BKA stump complication ([place]) 88/28/0034  . S/P bilateral BKA (below knee amputation) ([place]) 08/03/2013  . S/P BKA (below knee amputation) bilateral ([place]) 07/30/2013  . Open wound of both legs with complication 91/79/1505  . Acute blood loss anemia 07/12/2013  . Syncope 07/09/2013  . Intolerance to CAPD peritoneal dialysis s/p CAPD cath removal 07/09/2013 07/05/2013  . Critical lower limb ischemia 06/30/2013  . Extremity pain 06/30/2013  . Atherosclerosis of native arteries of the extremities with ulceration(440.23) 06/17/2013  . Gait disorder 05/31/2013  . Weakness 05/24/2013  . Depression 04/03/2013  . GERD (gastroesophageal reflux disease) 04/03/2013  . Atrial fibrillation ([place]) 04/02/2013  . DM (diabetes mellitus) ([place]) 12/28/2012  . Other complications due to renal dialysis device, implant, and graft 05/19/2012  . Diabetes mellitus, type 2 ([place]) 11/13/2011  . Anemia 07/02/2011  . ESRD on hemodialysis ([place]) 05/29/2011   Past Medical History:  Diagnosis Date  . A-fib ([place])   . Anemia    esrd  . Calciphylaxis 05/2013   lower ext  . Cancer ([place])    hx duodenal adenoCa 2002, resected  . Closed left arm fracture 1967  . Corneal abrasion, left   . Depression   . Diabetes mellitus    Type 1 iddm x 11 yrs  . Diabetic  neuropathy ([place])   . ESRD on hemodialysis ([place])    Pt has ESRD due to DM.  He had a L upper arm AVF prior to starting HD which was ligated due to L arm steal syndrome.  He started HD in Jan 2014 and did home HD.  The family couldn't cannulate the R arm AVF successfully so by mid 2014 they decided to switch to PD which was done late summer 2014.  In Jan 2015 he was admitted with FTT felt to be due to underdialysis on PD and PD was abandoned and he   . GERD (gastroesophageal reflux disease)   . Glomerulosclerosis, diabetic ([place])   . Heart murmur   . History of blood transfusion   .  Hypertension    off of meds due to orthostatic hypotension  . Kidney transplant recipient   . Open wound of both legs with complication    Pt has had progressive wounds of both LE's including gangrene of the toes and patchy necrosis of the calves.  He has been treated at [facility] by Dr Jerline Pain with hyperbaric O2 5d per week.  He is getting Na Thiosulfate with HD for suspected calciphylaxis. Pt says doctor's aren't sure if these ulcers were diabetic ulcers or calciphylaxis.  He underwent bilat BKA on 07/30/13.  He says that the woun    Family History  Problem Relation Age of Onset  . Cancer Mother   . Cancer Father        prostate cancer  . Heart disease Maternal Grandfather   . Stroke Paternal Grandmother     Past Surgical History:  Procedure Laterality Date  . AMPUTATION Bilateral 07/30/2013   Procedure: AMPUTATION BELOW KNEE;  Surgeon: Newt Minion, MD;  Location: [facility];  Service: Orthopedics;  Laterality: Bilateral;  Bilateral Below Knee Amputations  . AMPUTATION Bilateral 09/03/2013   Procedure: AMPUTATION BELOW KNEE;  Surgeon: Newt Minion, MD;  Location: [place];  Service: Orthopedics;  Laterality: Bilateral;  Bilateral Below Knee Amputation Revision  . AV FISTULA PLACEMENT  06/07/2011   Procedure: ARTERIOVENOUS (AV) FISTULA CREATION;  Surgeon: Angelia Mould, MD;  Location: [place];  Service: Vascular;   Laterality: Left;  . AV FISTULA PLACEMENT  06/18/2012   Procedure: ARTERIOVENOUS (AV) FISTULA CREATION;  Surgeon: Mal Misty, MD;  Location: [place];  Service: Vascular;  Laterality: Right;  [place]  . BILIARY DIVERSION, EXTERNAL  2002  . BONE MARROW BIOPSY  2012  . CAPD INSERTION N/A 01/14/2013   Procedure: LAPAROSCOPIC INSERTION CONTINUOUS AMBULATORY PERITONEAL DIALYSIS  (CAPD) CATHETER;  Surgeon: Adin Hector, MD;  Location: [place];  Service: General;  Laterality: N/A;  . CAPD REMOVAL N/A 07/09/2013   Procedure: CONTINUOUS AMBULATORY PERITONEAL DIALYSIS  (CAPD) CATHETER REMOVAL;  Surgeon: Adin Hector, MD;  Location: [facility];  Service: General;  Laterality: N/A;  . COLONOSCOPY     Hx: of  . INSERTION OF DIALYSIS CATHETER  05/22/2012   Procedure: INSERTION OF DIALYSIS CATHETER;  Surgeon: Mal Misty, MD;  Location: [place];  Service: Vascular;  Laterality: N/A;  right internal jugular vein  . IR ANGIO INTRA EXTRACRAN SEL COM CAROTID INNOMINATE BILAT MOD SED  10/28/2018  . IR ANGIO VERTEBRAL SEL SUBCLAVIAN INNOMINATE UNI R MOD SED  10/28/2018  . IR ANGIO VERTEBRAL SEL VERTEBRAL UNI L MOD SED  10/28/2018  . LIGATION OF ARTERIOVENOUS  FISTULA  05/22/2012   Procedure: LIGATION OF ARTERIOVENOUS  FISTULA;  Surgeon: Mal Misty, MD;  Location: [place];  Service: Vascular;  Laterality: Left;  Left brachio-cephalic fistula  . LOWER EXTREMITY ANGIOGRAM N/A 06/14/2013   Procedure: LOWER EXTREMITY ANGIOGRAM;  Surgeon: Angelia Mould, MD;  Location: [facility] CATH LAB;  Service: Cardiovascular;  Laterality: N/A;  . NEPHRECTOMY TRANSPLANTED ORGAN    . OTHER SURGICAL HISTORY     Cyst removed from back  . PORTACATH PLACEMENT  2002  . RADIOLOGY WITH ANESTHESIA N/A 10/28/2018   Procedure: IR WITH ANESTHESIA;  Surgeon: Radiologist, Medication, MD;  Location: [place];  Service: Radiology;  Laterality: N/A;  . REMOVAL OF A DIALYSIS CATHETER    . RENAL BIOPSY, PERCUTANEOUS  2012  . TONSILLECTOMY    . VASCULAR  SURGERY    .  WHIPPLE PROCEDURE  2002   duodenal ca, Dr. Harlow Asa   Social History   Occupational History  . Not on file  Tobacco Use  . Smoking status: Never Smoker  . Smokeless tobacco: Never Used  Substance and Sexual Activity  . Alcohol use: No  . Drug use: No  . Sexual activity: Not on file

## 2018-12-21 NOTE — Therapy (Signed)
[facility] [address] [place], [place], [address] Phone: [phone]   Fax:  [phone]  Physical Therapy Treatment and STG update  Patient Details  Name: Paul [name] MRN: [id number] Date of Birth: 10-Dec-1955 Referring Provider (PT): [name], MD  CLINIC OPERATION CHANGES: [facility]patient [facility] is open at lower capacity following universal masking, social distancing, and patient screening.  The patient's COVID risk of complications score is 6.  Encounter Date: 12/21/2018  PT End of Session - 12/21/18 1519    Visit Number  5    Number of Visits  25    Date for PT Re-Evaluation  02/16/19    Authorization Type  [employer] Medicare Part B    PT Start Time  1238    PT Stop Time  1318    PT Time Calculation (min)  40 min    Equipment Utilized During Treatment  Gait belt    Activity Tolerance  Patient tolerated treatment well;Patient limited by fatigue    Behavior During Therapy  WFL for tasks assessed/performed       Past Medical History:  Diagnosis Date  . A-fib ([place])   . Anemia    esrd  . Calciphylaxis 05/2013   lower ext  . Cancer ([place])    hx duodenal adenoCa 2002, resected  . Closed left arm fracture 1967  . Corneal abrasion, left   . Depression   . Diabetes mellitus    Type 1 iddm x 11 yrs  . Diabetic neuropathy ([place])   . ESRD on hemodialysis ([place])    Pt has ESRD due to DM.  He had a L upper arm AVF prior to starting HD which was ligated due to L arm steal syndrome.  He started HD in Jan 2014 and did home HD.  The family couldn't cannulate the R arm AVF successfully so by mid 2014 they decided to switch to PD which was done late summer 2014.  In Jan 2015 he was admitted with FTT felt to be due to underdialysis on PD and PD was abandoned and he   . GERD (gastroesophageal reflux disease)   . Glomerulosclerosis, diabetic ([place])   . Heart murmur   . History of blood transfusion   . Hypertension     off of meds due to orthostatic hypotension  . Kidney transplant recipient   . Open wound of both legs with complication    Pt has had progressive wounds of both LE's including gangrene of the toes and patchy necrosis of the calves.  He has been treated at [facility] by Dr Jerline Pain with hyperbaric O2 5d per week.  He is getting Na Thiosulfate with HD for suspected calciphylaxis. Pt says doctor's aren't sure if these ulcers were diabetic ulcers or calciphylaxis.  He underwent bilat BKA on 07/30/13.  He says that the woun    Past Surgical History:  Procedure Laterality Date  . AMPUTATION Bilateral 07/30/2013   Procedure: AMPUTATION BELOW KNEE;  Surgeon: Newt Minion, MD;  Location: [facility];  Service: Orthopedics;  Laterality: Bilateral;  Bilateral Below Knee Amputations  . AMPUTATION Bilateral 09/03/2013   Procedure: AMPUTATION BELOW KNEE;  Surgeon: Newt Minion, MD;  Location: [facility];  Service: Orthopedics;  Laterality: Bilateral;  Bilateral Below Knee Amputation Revision  . AV FISTULA PLACEMENT  06/07/2011   Procedure: ARTERIOVENOUS (AV) FISTULA CREATION;  Surgeon: Angelia Mould, MD;  Location: [facility];  Service: Vascular;  Laterality: Left;  .  AV FISTULA PLACEMENT  06/18/2012   Procedure: ARTERIOVENOUS (AV) FISTULA CREATION;  Surgeon: Mal Misty, MD;  Location: [facility];  Service: Vascular;  Laterality: Right;  [place]  . BILIARY DIVERSION, EXTERNAL  2002  . BONE MARROW BIOPSY  2012  . CAPD INSERTION N/A 01/14/2013   Procedure: LAPAROSCOPIC INSERTION CONTINUOUS AMBULATORY PERITONEAL DIALYSIS  (CAPD) CATHETER;  Surgeon: Adin Hector, MD;  Location: [facility];  Service: General;  Laterality: N/A;  . CAPD REMOVAL N/A 07/09/2013   Procedure: CONTINUOUS AMBULATORY PERITONEAL DIALYSIS  (CAPD) CATHETER REMOVAL;  Surgeon: Adin Hector, MD;  Location: [facility];  Service: General;  Laterality: N/A;  . COLONOSCOPY     Hx: of  . INSERTION OF DIALYSIS CATHETER  05/22/2012   Procedure: INSERTION OF DIALYSIS  CATHETER;  Surgeon: Mal Misty, MD;  Location: [facility];  Service: Vascular;  Laterality: N/A;  right internal jugular vein  . IR ANGIO INTRA EXTRACRAN SEL COM CAROTID INNOMINATE BILAT MOD SED  10/28/2018  . IR ANGIO VERTEBRAL SEL SUBCLAVIAN INNOMINATE UNI R MOD SED  10/28/2018  . IR ANGIO VERTEBRAL SEL VERTEBRAL UNI L MOD SED  10/28/2018  . LIGATION OF ARTERIOVENOUS  FISTULA  05/22/2012   Procedure: LIGATION OF ARTERIOVENOUS  FISTULA;  Surgeon: Mal Misty, MD;  Location: [facility];  Service: Vascular;  Laterality: Left;  Left brachio-cephalic fistula  . LOWER EXTREMITY ANGIOGRAM N/A 06/14/2013   Procedure: LOWER EXTREMITY ANGIOGRAM;  Surgeon: Angelia Mould, MD;  Location: [facility] C[facility];  Service: Cardiovascular;  Laterality: N/A;  . NEPHRECTOMY TRANSPLANTED ORGAN    . OTHER SURGICAL HISTORY     Cyst removed from back  . PORTACATH PLACEMENT  2002  . RADIOLOGY WITH ANESTHESIA N/A 10/28/2018   Procedure: IR WITH ANESTHESIA;  Surgeon: Radiologist, Medication, MD;  Location: [facility];  Service: Radiology;  Laterality: N/A;  . REMOVAL OF A DIALYSIS CATHETER    . RENAL BIOPSY, PERCUTANEOUS  2012  . TONSILLECTOMY    . VASCULAR SURGERY    . WHIPPLE PROCEDURE  2002   duodenal ca, Dr. Harlow Asa    There were no vitals filed for this visit.  Subjective Assessment - 12/21/18 1254    Subjective  The patient is not doing HEP.    Pertinent History  HTN, DM, renal transplant 05/23/2014, CKD st IV, calciphylaxis, A-Fib, bil. TTAs 07/30/2013,    Patient Stated Goals  To stop falls, get stronger, negotiate stairs better, to return to prior functional level, walk in/out of office safely without fatigue issues.    Currently in Pain?  No/denies         [facility] PT Assessment - 12/21/18 1255      Ambulation/Gait   Ambulation/Gait  Yes    Ambulation/Gait Assistance  5: Supervision    Ambulation/Gait Assistance Details  The patient is maintaining improved upright posture today with gait, his steps are more  consistent.    Ambulation Distance (Feet)  345 Feet    Assistive device  Prostheses;Straight cane;None    Gait Pattern  Step-through pattern;Decreased arm swing - right;Decreased stance time - right;Antalgic;Trunk flexed;Wide base of support    Ambulation Surface  Level;Indoor    Stairs  Yes    Stairs Assistance  5: Supervision    Stairs Assistance Details (indicate cue type and reason)  Min A with one handrail     Stair Management Technique  Two rails;Alternating pattern;Forwards    Number of Stairs  8      Berg Balance  Test   Sit to Stand  Able to stand  independently using hands    Standing Unsupported  Able to stand safely 2 minutes    Sitting with Back Unsupported but Feet Supported on Floor or Stool  Able to sit safely and securely 2 minutes    Stand to Sit  Controls descent by using hands    Transfers  Able to transfer safely, definite need of hands    Standing Unsupported with Eyes Closed  Able to stand 10 seconds safely    Standing Unsupported with Feet Together  Able to place feet together independently and stand for 1 minute with supervision    From Standing, Reach Forward with Outstretched Arm  Can reach forward >12 cm safely (5")    From Standing Position, Pick up Object from Floor  Able to pick up shoe, needs supervision    From Standing Position, Turn to Look Behind Over each Shoulder  Turn sideways only but maintains balance    Turn 360 Degrees  Able to turn 360 degrees safely but slowly    Standing Unsupported, Alternately Place Feet on Step/Stool  Needs assistance to keep from falling or unable to try    Standing Unsupported, One Foot in Front  Able to take small step independently and hold 30 seconds    Standing on One Leg  Tries to lift leg/unable to hold 3 seconds but remains standing independently    Total Score  37    Berg comment:  37/56                   [facility] Adult PT Treatment/Exercise - 12/21/18 1255      Neuro Re-ed    Neuro Re-ed Details    Standing moving one LE onto/off of 4" step, Step ups in parallel bars with UE support.      Exercises   Exercises  Other Exercises    Other Exercises   Discussed barriers to performing HEP.  Patient is working from home so he is more sedentary, walking less, and not using his HEP.  We discussed beginning to get into a routine with 3 activities (mini squats, hip abduction, and knee fleixon).  Reviewed each from HEP and discussed significance to improve outcomes.             PT Education - 12/21/18 1518    Education Details  The patient has extensive HEP.  PT recommended we begin with 3 key exercises to gain compliance and then consider adding more in as he gets into a routine.    Person(s) Educated  Patient    Methods  Explanation;Demonstration;Handout    Comprehension  Returned demonstration;Verbalized understanding       PT Short Term Goals - 12/21/18 1305      PT SHORT TERM GOAL #1   Title  Patient demonstrates understanding of initial HEP. (All STGs Target Date: 12/18/2018)    Baseline  Patient has HEP, however is not doing    Time  4    Period  Weeks    Status  Partially Met    Target Date  12/18/18      PT SHORT TERM GOAL #2   Title  Patient ambulates 300' with cane & prostheses with supervision.    Baseline  Walked 345 feet with supervision, cane + prostheses    Time  4    Period  Weeks    Status  Achieved    Target Date  12/18/18  PT SHORT TERM GOAL #3   Title  Patient negotiates stairs with 1 rail & cane with minA.    Baseline  The patient can negotiate 4 steps with bilateral rails with mod indep at slow pace.  He requires supervision with one HR and using a sidestep technique to descend and he requires min A ascending with one HR and hand held assist.    Time  4    Period  Weeks    Status  Partially Met    Target Date  12/18/18      PT SHORT TERM GOAL #4   Title  Berg Balance >/=36/56    Baseline  37/56    Time  4    Period  Weeks    Status  Achieved     Target Date  12/18/18       updated short term goals:  PT Short Term Goals - 12/21/18 1531      PT SHORT TERM GOAL #1   Title  Patient demonstrates understanding of progression of  HEP. (All STGs Target Date:01/20/19    Baseline  Patient has HEP, however is not doing    Time  4    Period  Weeks    Status  Revised    Target Date  01/20/19      PT SHORT TERM GOAL #2   Title  The patient will improve DGI from 5/24 up to 12/24.    Time  4    Period  Weeks    Status  New    Target Date  01/20/19      PT SHORT TERM GOAL #3   Title  Patient negotiates stairs with 1 rail & cane with supervision.    Baseline  The patient can negotiate 4 steps with bilateral rails with mod indep at slow pace.  He requires supervision with one HR and using a sidestep technique to descend and he requires min A ascending with one HR and hand held assist.    Time  4    Period  Weeks    Status  Revised    Target Date  01/20/19      PT SHORT TERM GOAL #4   Title  Improve berg score from 37/56 to > or equal to 41/56.    Baseline  37/56    Time  4    Period  Weeks    Status  Revised    Target Date  01/20/19       PT Long Term Goals - 12/10/18 2234      PT LONG TERM GOAL #1   Title  Patient demonstrates & verbalizes understanding of ongoing HEP. (All LTGs Target Date: 02/12/2019)    Time  12    Period  Weeks    Status  On-going    Target Date  02/12/19      PT LONG TERM GOAL #2   Title  Berg Balance >45/56 to indicate lower fall risk.    Time  12    Period  Weeks    Status  On-going    Target Date  02/12/19      PT LONG TERM GOAL #3   Title  Timed Up & Go <13.5sec & cognitive TUG <15.5sec without device except prostheses safely to indicate lower fall risk.    Time  12    Period  Weeks    Status  On-going    Target Date  02/12/19      PT LONG  TERM GOAL #4   Title  Dynamic Gait Index >/= 19/24 with cane to indicate lower fall risk.    Time  12    Period  Weeks    Status  On-going     Target Date  02/12/19      PT LONG TERM GOAL #5   Title  Patient ambulates 500' with cane & prostheses modified independent for community mobility.    Time  12    Period  Weeks    Status  On-going    Target Date  02/12/19      PT LONG TERM GOAL #6   Title  Patient negotiates stairs 1 rail, ramps & curbs with cane & prostheses modified independent  for community access.    Time  12    Period  Weeks    Status  On-going    Target Date  02/12/19      PT LONG TERM GOAL #7   Title  Patient ambulates 100' around furniture with prostheses only modified independent.    Time  12    Period  Weeks    Status  On-going    Target Date  02/12/19           Plan - 12/21/18 1525    Clinical Impression Statement  The patient partially met STGs.  PT to continue working towards updated STGs and LTGs focusing on LE strengthening, endurance, hip strategies for balance and functional tasks of community ambulation and stair negotiation.    PT Frequency  2x / week    PT Duration  12 weeks    PT Treatment/Interventions  ADLs/Self Care Home Management;DME Instruction;Gait training;Stair training;Functional mobility training;Therapeutic activities;Therapeutic exercise;Balance training;Neuromuscular re-education;Patient/family education;Prosthetic Training    PT Next Visit Plan  check HEP-- patient was not doing, so moved back to only 3 key exercises and will build slowly if he is able to perform, balance, gait training, endurance, core activation, strengthening.    Consulted and Agree with Plan of Care  Patient       Patient will benefit from skilled therapeutic intervention in order to improve the following deficits and impairments:  Abnormal gait, Decreased activity tolerance, Decreased balance, Decreased endurance, Decreased mobility, Decreased strength, Postural dysfunction, Prosthetic Dependency  Visit Diagnosis: 1. Other abnormalities of gait and mobility   2. Unsteadiness on feet   3. Muscle  weakness (generalized)   4. Abnormal posture   5. History of falling        Problem List Patient Active Problem List   Diagnosis Date Noted  . Acute metabolic encephalopathy 27/25/3664  . Stroke ([place]) 10/28/2018  . Middle cerebral artery embolism, left 10/28/2018  . Metabolic acidosis 40/34/7425  . Dehydration 05/24/2018  . Pressure injury of skin 05/24/2018  . Dyspnea 05/23/2018  . Acute respiratory failure with hypoxia ([place]) 05/23/2018  . Abnormal cardiovascular stress test 12/29/2013  . Awaiting organ transplant 12/09/2013  . Leg ulcer ([place]) 11/17/2013  . Chronic kidney disease (CKD), stage V ([place]) 11/17/2013  . Diabetes ([place]) 11/17/2013  . Cancer of duodenum ([place]) 11/17/2013  . Dyslipidemia 11/17/2013  . Chronic kidney disease requiring chronic dialysis ([place]) 11/17/2013  . Hypercholesteremia 11/17/2013  . H/O malignant neoplasm 11/17/2013  . H/O: HTN (hypertension) 11/17/2013  . BP (high blood pressure) 11/17/2013  . Hypotension 11/17/2013  . Angiopathy, peripheral ([place]) 11/17/2013  . AF (paroxysmal atrial fibrillation) ([place]) 11/17/2013  . BKA stump complication ([place]) 95/63/8756  . S/P bilateral BKA (below knee amputation) ([place]) 08/03/2013  .  S/P BKA (below knee amputation) bilateral ([place]) 07/30/2013  . Open wound of both legs with complication 47/01/6282  . Acute blood loss anemia 07/12/2013  . Syncope 07/09/2013  . Intolerance to CAPD peritoneal dialysis s/p CAPD cath removal 07/09/2013 07/05/2013  . Critical lower limb ischemia 06/30/2013  . Extremity pain 06/30/2013  . Atherosclerosis of native arteries of the extremities with ulceration(440.23) 06/17/2013  . Gait disorder 05/31/2013  . Weakness 05/24/2013  . Depression 04/03/2013  . GERD (gastroesophageal reflux disease) 04/03/2013  . Atrial fibrillation ([place]) 04/02/2013  . DM (diabetes mellitus) ([place]) 12/28/2012  . Other complications due to renal dialysis device, implant, and graft 05/19/2012  . Diabetes  mellitus, type 2 ([place]) 11/13/2011  . Anemia 07/02/2011  . ESRD on hemodialysis ([place]) 05/29/2011    [place], PT 12/21/2018, 3:29 PM  [facility] [address] [place], [place], [address] Phone: [phone]   Fax:  [phone]  Name: Paul [name] MRN: [id number] Date of Birth: 22-Jun-1955

## 2018-12-23 ENCOUNTER — Other Ambulatory Visit: Payer: Self-pay

## 2018-12-23 ENCOUNTER — Encounter (HOSPITAL_COMMUNITY)
Admission: RE | Admit: 2018-12-23 | Discharge: 2018-12-23 | Disposition: A | Discharge status: Home / Self Care | Payer: Managed Care, Other (non HMO) | Source: Ambulatory Visit | Attending: Nephrology | Admitting: Nephrology

## 2018-12-23 ENCOUNTER — Ambulatory Visit: Payer: Managed Care, Other (non HMO) | Admitting: Physical Therapy

## 2018-12-23 VITALS — BP 167/66 | HR 50 | Temp 97.3°F | Resp 18

## 2018-12-23 DIAGNOSIS — D638 Anemia in other chronic diseases classified elsewhere: Secondary | ICD-10-CM | POA: Insufficient documentation

## 2018-12-23 LAB — POCT HEMOGLOBIN-HEMACUE: Hemoglobin: 11.8 g/dL — ABNORMAL LOW (ref 13.0–17.0)

## 2018-12-23 MED ORDER — DARBEPOETIN ALFA 300 MCG/0.6ML IJ SOSY
225.0000 ug | PREFILLED_SYRINGE | INTRAMUSCULAR | Status: DC
  Administered 2018-12-23: 225 ug via SUBCUTANEOUS

## 2018-12-23 MED ORDER — DARBEPOETIN ALFA 25 MCG/0.42ML IJ SOSY
PREFILLED_SYRINGE | INTRAMUSCULAR | Status: AC
  Filled 2018-12-23: qty 0.42

## 2018-12-23 MED ORDER — DARBEPOETIN ALFA 200 MCG/0.4ML IJ SOSY
PREFILLED_SYRINGE | INTRAMUSCULAR | Status: AC
  Filled 2018-12-23: qty 0.4

## 2018-12-24 DIAGNOSIS — D122 Benign neoplasm of ascending colon: Secondary | ICD-10-CM | POA: Diagnosis not present

## 2018-12-24 DIAGNOSIS — D124 Benign neoplasm of descending colon: Secondary | ICD-10-CM | POA: Diagnosis not present

## 2018-12-24 DIAGNOSIS — Z86010 Personal history of colonic polyps: Secondary | ICD-10-CM | POA: Diagnosis not present

## 2018-12-24 DIAGNOSIS — K648 Other hemorrhoids: Secondary | ICD-10-CM | POA: Diagnosis not present

## 2018-12-24 MED FILL — Darbepoetin Alfa Soln Prefilled Syringe 200 MCG/0.4ML: INTRAMUSCULAR | Qty: 0.4 | Status: AC

## 2018-12-24 MED FILL — Darbepoetin Alfa Soln Prefilled Syringe 25 MCG/0.42ML: INTRAMUSCULAR | Qty: 0.42 | Status: AC

## 2018-12-28 ENCOUNTER — Ambulatory Visit: Payer: Managed Care, Other (non HMO) | Admitting: Physical Therapy

## 2018-12-28 ENCOUNTER — Encounter: Payer: Self-pay | Admitting: Physical Therapy

## 2018-12-28 ENCOUNTER — Other Ambulatory Visit: Payer: Self-pay

## 2018-12-28 DIAGNOSIS — R2689 Other abnormalities of gait and mobility: Secondary | ICD-10-CM

## 2018-12-28 DIAGNOSIS — M6281 Muscle weakness (generalized): Secondary | ICD-10-CM

## 2018-12-28 DIAGNOSIS — R2681 Unsteadiness on feet: Secondary | ICD-10-CM

## 2018-12-28 DIAGNOSIS — Z9181 History of falling: Secondary | ICD-10-CM | POA: Diagnosis not present

## 2018-12-28 DIAGNOSIS — R293 Abnormal posture: Secondary | ICD-10-CM | POA: Diagnosis not present

## 2018-12-28 NOTE — Therapy (Signed)
[facility] [address] [place], [place], [address] Phone: [phone]   Fax:  [phone]  Physical Therapy Treatment  Patient Details  Name: Paul [name] MRN: [id number] Date of Birth: 02-02-1956 Referring Provider (PT): Dibas Dorthy Cooler, MD   Encounter Date: 12/28/2018   CLINIC OPERATION CHANGES: Outpatient [facility] is open at lower capacity following universal masking, social distancing, and patient screening.  The patient's COVID risk of complications score is 6.   PT End of Session - 12/28/18 1322    Visit Number  6    Number of Visits  25    Date for PT Re-Evaluation  02/16/19    Authorization Type  [employer] Medicare Part B    PT Start Time  1230    PT Stop Time  1314    PT Time Calculation (min)  44 min    Equipment Utilized During Treatment  Gait belt    Activity Tolerance  Patient tolerated treatment well;Patient limited by fatigue    Behavior During Therapy  WFL for tasks assessed/performed       Past Medical History:  Diagnosis Date  . A-fib ([place])   . Anemia    esrd  . Calciphylaxis 05/2013   lower ext  . Cancer ([place])    hx duodenal adenoCa 2002, resected  . Closed left arm fracture 1967  . Corneal abrasion, left   . Depression   . Diabetes mellitus    Type 1 iddm x 11 yrs  . Diabetic neuropathy ([place])   . ESRD on hemodialysis ([place])    Pt has ESRD due to DM.  He had a L upper arm AVF prior to starting HD which was ligated due to L arm steal syndrome.  He started HD in Jan 2014 and did home HD.  The family couldn't cannulate the R arm AVF successfully so by mid 2014 they decided to switch to PD which was done late summer 2014.  In Jan 2015 he was admitted with FTT felt to be due to underdialysis on PD and PD was abandoned and he   . GERD (gastroesophageal reflux disease)   . Glomerulosclerosis, diabetic ([place])   . Heart murmur   . History of blood transfusion   . Hypertension    off of  meds due to orthostatic hypotension  . Kidney transplant recipient   . Open wound of both legs with complication    Pt has had progressive wounds of both LE's including gangrene of the toes and patchy necrosis of the calves.  He has been treated at [facility] by Dr Jerline Pain with hyperbaric O2 5d per week.  He is getting Na Thiosulfate with HD for suspected calciphylaxis. Pt says doctor's aren't sure if these ulcers were diabetic ulcers or calciphylaxis.  He underwent bilat BKA on 07/30/13.  He says that the woun    Past Surgical History:  Procedure Laterality Date  . AMPUTATION Bilateral 07/30/2013   Procedure: AMPUTATION BELOW KNEE;  Surgeon: Newt Minion, MD;  Location: [facility];  Service: Orthopedics;  Laterality: Bilateral;  Bilateral Below Knee Amputations  . AMPUTATION Bilateral 09/03/2013   Procedure: AMPUTATION BELOW KNEE;  Surgeon: Newt Minion, MD;  Location: [facility];  Service: Orthopedics;  Laterality: Bilateral;  Bilateral Below Knee Amputation Revision  . AV FISTULA PLACEMENT  06/07/2011   Procedure: ARTERIOVENOUS (AV) FISTULA CREATION;  Surgeon: Angelia Mould, MD;  Location: [facility];  Service: Vascular;  Laterality: Left;  .  AV FISTULA PLACEMENT  06/18/2012   Procedure: ARTERIOVENOUS (AV) FISTULA CREATION;  Surgeon: Mal Misty, MD;  Location: [facility];  Service: Vascular;  Laterality: Right;  [place]  . BILIARY DIVERSION, EXTERNAL  2002  . BONE MARROW BIOPSY  2012  . CAPD INSERTION N/A 01/14/2013   Procedure: LAPAROSCOPIC INSERTION CONTINUOUS AMBULATORY PERITONEAL DIALYSIS  (CAPD) CATHETER;  Surgeon: Adin Hector, MD;  Location: [facility];  Service: General;  Laterality: N/A;  . CAPD REMOVAL N/A 07/09/2013   Procedure: CONTINUOUS AMBULATORY PERITONEAL DIALYSIS  (CAPD) CATHETER REMOVAL;  Surgeon: Adin Hector, MD;  Location: [facility];  Service: General;  Laterality: N/A;  . COLONOSCOPY     Hx: of  . INSERTION OF DIALYSIS CATHETER  05/22/2012   Procedure: INSERTION OF DIALYSIS CATHETER;   Surgeon: Mal Misty, MD;  Location: [facility];  Service: Vascular;  Laterality: N/A;  right internal jugular vein  . IR ANGIO INTRA EXTRACRAN SEL COM CAROTID INNOMINATE BILAT MOD SED  10/28/2018  . IR ANGIO VERTEBRAL SEL SUBCLAVIAN INNOMINATE UNI R MOD SED  10/28/2018  . IR ANGIO VERTEBRAL SEL VERTEBRAL UNI L MOD SED  10/28/2018  . LIGATION OF ARTERIOVENOUS  FISTULA  05/22/2012   Procedure: LIGATION OF ARTERIOVENOUS  FISTULA;  Surgeon: Mal Misty, MD;  Location: [facility];  Service: Vascular;  Laterality: Left;  Left brachio-cephalic fistula  . LOWER EXTREMITY ANGIOGRAM N/A 06/14/2013   Procedure: LOWER EXTREMITY ANGIOGRAM;  Surgeon: Angelia Mould, MD;  Location[facility] C[facility];  Service: Cardiovascular;  Laterality: N/A;  . NEPHRECTOMY TRANSPLANTED ORGAN    . OTHER SURGICAL HISTORY     Cyst removed from back  . PORTACATH PLACEMENT  2002  . RADIOLOGY WITH ANESTHESIA N/A 10/28/2018   Procedure: IR WITH ANESTHESIA;  Surgeon: Radiologist, Medication, MD;  Location: [facility];  Service: Radiology;  Laterality: N/A;  . REMOVAL OF A DIALYSIS CATHETER    . RENAL BIOPSY, PERCUTANEOUS  2012  . TONSILLECTOMY    . VASCULAR SURGERY    . WHIPPLE PROCEDURE  2002   duodenal ca, Dr. Harlow Asa    There were no vitals filed for this visit.  Subjective Assessment - 12/28/18 1235    Subjective  He went to [facility] and will go back on transplant list possibly after testing (colonscopy, chemical stress test of heart, dermatology).  He saw Dr. Sharol Given and is getting new bilateral transtibial prostheses with casting on Friday.    Pertinent History  HTN, DM, renal transplant 05/23/2014, CKD st IV, calciphylaxis, A-Fib, bil. TTAs 07/30/2013,    Patient Stated Goals  To stop falls, get stronger, negotiate stairs better, to return to prior functional level, walk in/out of office safely without fatigue issues.    Currently in Pain?  No/denies                       [facility] Adult PT Treatment/Exercise - 12/28/18 1230       Ambulation/Gait   Ambulation/Gait  Yes    Ambulation/Gait Assistance  5: Supervision    Assistive device  Prostheses;Straight cane;None    Gait Pattern  Step-through pattern;Decreased arm swing - right;Decreased stance time - right;Antalgic;Trunk flexed;Wide base of support    Stairs  Yes    Stairs Assistance  5: Supervision    Stair Management Technique  Two rails;One rail Right;One rail Left;With cane;Forwards;Alternating pattern    Number of Stairs  4   4 reps cane / single rail, 1 reps 2 rails.  Neuro Re-ed    Neuro Re-ed Details   seated on 75cm therapy ball with BUE support: pelvic motions rolling ant/post, med/lat & circles;  alternate stepping forward, alternate hip abduction & reciprocal arm swings.       Knee/Hip Exercises: Aerobic   Nustep  Level 3 with BLEs & BUEs for 5 minutes   light-headed at end:  BP 168/82, seated 3 min rest     Knee/Hip Exercises: Standing   Hip Flexion  Stengthening;Right;Left;10 reps;Knee straight   red theraband with BUE support on chair backs   Hip ADduction  Strengthening;Right;Left;5 reps   red theraband with BUE support on chair backs   Hip Abduction  Stengthening;Right;Left;5 reps;Knee straight   red theraband with BUE support on chair backs   Hip Extension  Stengthening;Right;Left;5 reps;Knee straight   red theraband with BUE support on chair backs   Forward Step Up  Right;Left;5 reps;Hand Hold: 2;Step Height: 4"    Step Down  Right;Left;5 reps;Hand Hold: 2;Step Height: 4"               PT Short Term Goals - 12/21/18 1531      PT SHORT TERM GOAL #1   Title  Patient demonstrates understanding of progression of  HEP. (All STGs Target Date:01/20/19    Baseline  Patient has HEP, however is not doing    Time  4    Period  Weeks    Status  Revised    Target Date  01/20/19      PT SHORT TERM GOAL #2   Title  The patient will improve DGI from 5/24 up to 12/24.    Time  4    Period  Weeks    Status  New    Target Date   01/20/19      PT SHORT TERM GOAL #3   Title  Patient negotiates stairs with 1 rail & cane with supervision.    Baseline  The patient can negotiate 4 steps with bilateral rails with mod indep at slow pace.  He requires supervision with one HR and using a sidestep technique to descend and he requires min A ascending with one HR and hand held assist.    Time  4    Period  Weeks    Status  Revised    Target Date  01/20/19      PT SHORT TERM GOAL #4   Title  Improve berg score from 37/56 to > or equal to 41/56.    Baseline  37/56    Time  4    Period  Weeks    Status  Revised    Target Date  01/20/19        PT Long Term Goals - 12/10/18 2234      PT LONG TERM GOAL #1   Title  Patient demonstrates & verbalizes understanding of ongoing HEP. (All LTGs Target Date: 02/12/2019)    Time  12    Period  Weeks    Status  On-going    Target Date  02/12/19      PT LONG TERM GOAL #2   Title  Berg Balance >45/56 to indicate lower fall risk.    Time  12    Period  Weeks    Status  On-going    Target Date  02/12/19      PT LONG TERM GOAL #3   Title  Timed Up & Go <13.5sec & cognitive TUG <15.5sec without device except prostheses safely to  indicate lower fall risk.    Time  12    Period  Weeks    Status  On-going    Target Date  02/12/19      PT LONG TERM GOAL #4   Title  Dynamic Gait Index >/= 19/24 with cane to indicate lower fall risk.    Time  12    Period  Weeks    Status  On-going    Target Date  02/12/19      PT LONG TERM GOAL #5   Title  Patient ambulates 500' with cane & prostheses modified independent for community mobility.    Time  12    Period  Weeks    Status  On-going    Target Date  02/12/19      PT LONG TERM GOAL #6   Title  Patient negotiates stairs 1 rail, ramps & curbs with cane & prostheses modified independent  for community access.    Time  12    Period  Weeks    Status  On-going    Target Date  02/12/19      PT LONG TERM GOAL #7   Title  Patient  ambulates 100' around furniture with prostheses only modified independent.    Time  12    Period  Weeks    Status  On-going    Target Date  02/12/19            Plan - 12/28/18 2305    Clinical Impression Statement  Patient continues to fatigue easily and requires frequent short (~2-3 min) rests. Patient's strength & endurance seem to be primary limiting factor.    PT Frequency  2x / week    PT Duration  12 weeks    PT Treatment/Interventions  ADLs/Self Care Home Management;DME Instruction;Gait training;Stair training;Functional mobility training;Therapeutic activities;Therapeutic exercise;Balance training;Neuromuscular re-education;Patient/family education;Prosthetic Training    PT Next Visit Plan  continue towards updated STGs. work on Armed forces operational officer with Plan of Care  Patient       Patient will benefit from skilled therapeutic intervention in order to improve the following deficits and impairments:  Abnormal gait, Decreased activity tolerance, Decreased balance, Decreased endurance, Decreased mobility, Decreased strength, Postural dysfunction, Prosthetic Dependency  Visit Diagnosis: 1. Other abnormalities of gait and mobility   2. Unsteadiness on feet   3. Muscle weakness (generalized)   4. Abnormal posture        Problem List Patient Active Problem List   Diagnosis Date Noted  . Acute metabolic encephalopathy 68/07/2120  . Stroke ([place]) 10/28/2018  . Middle cerebral artery embolism, left 10/28/2018  . Metabolic acidosis 48/25/0037  . Dehydration 05/24/2018  . Pressure injury of skin 05/24/2018  . Dyspnea 05/23/2018  . Acute respiratory failure with hypoxia ([place]) 05/23/2018  . Abnormal cardiovascular stress test 12/29/2013  . Awaiting organ transplant 12/09/2013  . Leg ulcer ([place]) 11/17/2013  . Chronic kidney disease (CKD), stage V ([employer]) 11/17/2013  . Diabetes ([place]) 11/17/2013  . Cancer of duodenum ([place]) 11/17/2013  . Dyslipidemia  11/17/2013  . Chronic kidney disease requiring chronic dialysis ([place]) 11/17/2013  . Hypercholesteremia 11/17/2013  . H/O malignant neoplasm 11/17/2013  . H/O: HTN (hypertension) 11/17/2013  . BP (high blood pressure) 11/17/2013  . Hypotension 11/17/2013  . Angiopathy, peripheral ([place]) 11/17/2013  . AF (paroxysmal atrial fibrillation) ([place]) 11/17/2013  . BKA stump complication ([place]) 04/88/8916  . S/P bilateral BKA (below knee amputation) ([place]) 08/03/2013  . S/P BKA (  below knee amputation) bilateral ([place]) 07/30/2013  . Open wound of both legs with complication 62/19/4712  . Acute blood loss anemia 07/12/2013  . Syncope 07/09/2013  . Intolerance to CAPD peritoneal dialysis s/p CAPD cath removal 07/09/2013 07/05/2013  . Critical lower limb ischemia 06/30/2013  . Extremity pain 06/30/2013  . Atherosclerosis of native arteries of the extremities with ulceration(440.23) 06/17/2013  . Gait disorder 05/31/2013  . Weakness 05/24/2013  . Depression 04/03/2013  . GERD (gastroesophageal reflux disease) 04/03/2013  . Atrial fibrillation ([place]) 04/02/2013  . DM (diabetes mellitus) ([place]) 12/28/2012  . Other complications due to renal dialysis device, implant, and graft 05/19/2012  . Diabetes mellitus, type 2 ([place]) 11/13/2011  . Anemia 07/02/2011  . ESRD on hemodialysis ([place]) 05/29/2011    Jatorian Renault PT, DPT 12/28/2018, 11:08 PM  [facility] [address] [place], [place], [address] Phone: [phone]   Fax:  [phone]  Name: NAMON VILLARIN MRN: [id number] Date of Birth: April 17, 1956

## 2018-12-29 DIAGNOSIS — D122 Benign neoplasm of ascending colon: Secondary | ICD-10-CM | POA: Diagnosis not present

## 2018-12-29 DIAGNOSIS — D124 Benign neoplasm of descending colon: Secondary | ICD-10-CM | POA: Diagnosis not present

## 2018-12-30 ENCOUNTER — Encounter: Payer: Self-pay | Admitting: Physical Therapy

## 2018-12-30 ENCOUNTER — Other Ambulatory Visit: Payer: Self-pay

## 2018-12-30 ENCOUNTER — Ambulatory Visit: Payer: Managed Care, Other (non HMO) | Admitting: Physical Therapy

## 2018-12-30 DIAGNOSIS — R2681 Unsteadiness on feet: Secondary | ICD-10-CM | POA: Diagnosis not present

## 2018-12-30 DIAGNOSIS — Z9181 History of falling: Secondary | ICD-10-CM

## 2018-12-30 DIAGNOSIS — M6281 Muscle weakness (generalized): Secondary | ICD-10-CM | POA: Diagnosis not present

## 2018-12-30 DIAGNOSIS — R293 Abnormal posture: Secondary | ICD-10-CM | POA: Diagnosis not present

## 2018-12-30 DIAGNOSIS — R2689 Other abnormalities of gait and mobility: Secondary | ICD-10-CM | POA: Diagnosis not present

## 2018-12-30 NOTE — Therapy (Signed)
[facility] [address] [place], [place], [address] Phone: [phone]   Fax:  [phone]  Physical Therapy Treatment  Patient Details  Name: Paul [name] MRN: [id number] Date of Birth: Oct 10, 1955 Referring Provider (PT): Dibas Dorthy Cooler, MD   Encounter Date: 12/30/2018   CLINIC OPERATION CHANGES: [facility]patient [facility] is open at lower capacity following universal masking, social distancing, and patient screening.  The patient's COVID risk of complications score is 6.   PT End of Session - 12/30/18 1347    Visit Number  7    Number of Visits  25    Date for PT Re-Evaluation  02/16/19    Authorization Type  [employer] & Medicare Part B    PT Start Time  1230    PT Stop Time  1315    PT Time Calculation (min)  45 min    Equipment Utilized During Treatment  Gait belt    Activity Tolerance  Patient tolerated treatment well;Patient limited by fatigue    Behavior During Therapy  WFL for tasks assessed/performed       Past Medical History:  Diagnosis Date  . A-fib ([place])   . Anemia    esrd  . Calciphylaxis 05/2013   lower ext  . Cancer ([place])    hx duodenal adenoCa 2002, resected  . Closed left arm fracture 1967  . Corneal abrasion, left   . Depression   . Diabetes mellitus    Type 1 iddm x 11 yrs  . Diabetic neuropathy ([place])   . ESRD on hemodialysis ([place])    Pt has ESRD due to DM.  He had a L upper arm AVF prior to starting HD which was ligated due to L arm steal syndrome.  He started HD in Jan 2014 and did home HD.  The family couldn't cannulate the R arm AVF successfully so by mid 2014 they decided to switch to PD which was done late summer 2014.  In Jan 2015 he was admitted with FTT felt to be due to underdialysis on PD and PD was abandoned and he   . GERD (gastroesophageal reflux disease)   . Glomerulosclerosis, diabetic ([place])   . Heart murmur   . History of blood transfusion   . Hypertension    off of  meds due to orthostatic hypotension  . Kidney transplant recipient   . Open wound of both legs with complication    Pt has had progressive wounds of both LE's including gangrene of the toes and patchy necrosis of the calves.  He has been treated at [facility] by Dr Jerline Pain with hyperbaric O2 5d per week.  He is getting Na Thiosulfate with HD for suspected calciphylaxis. Pt says doctor's aren't sure if these ulcers were diabetic ulcers or calciphylaxis.  He underwent bilat BKA on 07/30/13.  He says that the woun    Past Surgical History:  Procedure Laterality Date  . AMPUTATION Bilateral 07/30/2013   Procedure: AMPUTATION BELOW KNEE;  Surgeon: Newt Minion, MD;  Location: [facility];  Service: Orthopedics;  Laterality: Bilateral;  Bilateral Below Knee Amputations  . AMPUTATION Bilateral 09/03/2013   Procedure: AMPUTATION BELOW KNEE;  Surgeon: Newt Minion, MD;  Location: [facility];  Service: Orthopedics;  Laterality: Bilateral;  Bilateral Below Knee Amputation Revision  . AV FISTULA PLACEMENT  06/07/2011   Procedure: ARTERIOVENOUS (AV) FISTULA CREATION;  Surgeon: Angelia Mould, MD;  Location: [facility];  Service: Vascular;  Laterality: Left;  .  AV FISTULA PLACEMENT  06/18/2012   Procedure: ARTERIOVENOUS (AV) FISTULA CREATION;  Surgeon: Mal Misty, MD;  Location: [facility];  Service: Vascular;  Laterality: Right;  [place]  . BILIARY DIVERSION, EXTERNAL  2002  . BONE MARROW BIOPSY  2012  . CAPD INSERTION N/A 01/14/2013   Procedure: LAPAROSCOPIC INSERTION CONTINUOUS AMBULATORY PERITONEAL DIALYSIS  (CAPD) CATHETER;  Surgeon: Adin Hector, MD;  Location: [name];  Service: General;  Laterality: N/A;  . CAPD REMOVAL N/A 07/09/2013   Procedure: CONTINUOUS AMBULATORY PERITONEAL DIALYSIS  (CAPD) CATHETER REMOVAL;  Surgeon: Adin Hector, MD;  Location: [facility];  Service: General;  Laterality: N/A;  . COLONOSCOPY     Hx: of  . INSERTION OF DIALYSIS CATHETER  05/22/2012   Procedure: INSERTION OF DIALYSIS CATHETER;   Surgeon: Mal Misty, MD;  Location: [facility];  Service: Vascular;  Laterality: N/A;  right internal jugular vein  . IR ANGIO INTRA EXTRACRAN SEL COM CAROTID INNOMINATE BILAT MOD SED  10/28/2018  . IR ANGIO VERTEBRAL SEL SUBCLAVIAN INNOMINATE UNI R MOD SED  10/28/2018  . IR ANGIO VERTEBRAL SEL VERTEBRAL UNI L MOD SED  10/28/2018  . LIGATION OF ARTERIOVENOUS  FISTULA  05/22/2012   Procedure: LIGATION OF ARTERIOVENOUS  FISTULA;  Surgeon: Mal Misty, MD;  Location: [facility];  Service: Vascular;  Laterality: Left;  Left brachio-cephalic fistula  . LOWER EXTREMITY ANGIOGRAM N/A 06/14/2013   Procedure: LOWER EXTREMITY ANGIOGRAM;  Surgeon: Angelia Mould, MD;  Location: [facility] CATH LAB;  Service: Cardiovascular;  Laterality: N/A;  . NEPHRECTOMY TRANSPLANTED ORGAN    . OTHER SURGICAL HISTORY     Cyst removed from back  . PORTACATH PLACEMENT  2002  . RADIOLOGY WITH ANESTHESIA N/A 10/28/2018   Procedure: IR WITH ANESTHESIA;  Surgeon: Radiologist, Medication, MD;  Location: [facility];  Service: Radiology;  Laterality: N/A;  . REMOVAL OF A DIALYSIS CATHETER    . RENAL BIOPSY, PERCUTANEOUS  2012  . TONSILLECTOMY    . VASCULAR SURGERY    . WHIPPLE PROCEDURE  2002   duodenal ca, Dr. Harlow Asa    There were no vitals filed for this visit.  Subjective Assessment - 12/30/18 1233    Subjective  He is going to prosthetist Friday to be casted for new prostheses.  He was not sore after workout with PT 2 days ago.    Pertinent History  HTN, DM, renal transplant 05/23/2014, CKD st IV, calciphylaxis, A-Fib, bil. TTAs 07/30/2013,    Patient Stated Goals  To stop falls, get stronger, negotiate stairs better, to return to prior functional level, walk in/out of office safely without fatigue issues.    Currently in Pain?  No/denies                       [facility] Adult PT Treatment/Exercise - 12/30/18 1230      Transfers   Transfers  Sit to Stand;Stand to Sit    Sit to Stand  5: Supervision;From elevated  surface;Without upper extremity assist   from 24" bar stool   Stand to Sit  5: Supervision;Without upper extremity assist   to 24" stool     Ambulation/Gait   Ambulation/Gait  Yes    Ambulation/Gait Assistance  5: Supervision    Ambulation/Gait Assistance Details  working on scanning while ambulating maintaining path & pace    Ambulation Distance (Feet)  300 Feet    Assistive device  Prostheses;Straight cane    Gait Pattern  Step-through  pattern;Decreased arm swing - right;Decreased stance time - right;Antalgic;Trunk flexed;Wide base of support    Ambulation Surface  Indoor;Level    Stairs  --    Stairs Assistance  --    Stair Management Technique  --    Number of Stairs  --      High Level Balance   High Level Balance Activities  Side stepping;Backward walking;Negotitating around obstacles   cane & bil. TTA prostheses   High Level Balance Comments  tactile & verbal cues on wt shift      Neuro Re-ed    Neuro Re-ed Details   --      Knee/Hip Exercises: Aerobic   Nustep  --      Knee/Hip Exercises: Standing   Hip Flexion  --    Hip ADduction  --    Hip Abduction  --    Hip Extension  --    Forward Step Up  Right;Left;5 reps;Hand Hold: 2;Step Height: 4"    Step Down  Right;Left;5 reps;Hand Hold: 2;Step Height: 4"          Balance Exercises - 12/30/18 1230      Balance Exercises: Standing   Stepping Strategy  Anterior;Posterior;Lateral;Foam/compliant surface;5 reps   step off & stabilize with intermittent UE support on paralle   Gait with Head Turns  Forward    Other Standing Exercises  Red Theraband resistive exercises to facilitate core & BLE balance 10 reps ea. reciprocal & BUEs - rows, forward reach & biceps curls.           PT Short Term Goals - 12/21/18 1531      PT SHORT TERM GOAL #1   Title  Patient demonstrates understanding of progression of  HEP. (All STGs Target Date:01/20/19    Baseline  Patient has HEP, however is not doing    Time  4    Period   Weeks    Status  Revised    Target Date  01/20/19      PT SHORT TERM GOAL #2   Title  The patient will improve DGI from 5/24 up to 12/24.    Time  4    Period  Weeks    Status  New    Target Date  01/20/19      PT SHORT TERM GOAL #3   Title  Patient negotiates stairs with 1 rail & cane with supervision.    Baseline  The patient can negotiate 4 steps with bilateral rails with mod indep at slow pace.  He requires supervision with one HR and using a sidestep technique to descend and he requires min A ascending with one HR and hand held assist.    Time  4    Period  Weeks    Status  Revised    Target Date  01/20/19      PT SHORT TERM GOAL #4   Title  Improve berg score from 37/56 to > or equal to 41/56.    Baseline  37/56    Time  4    Period  Weeks    Status  Revised    Target Date  01/20/19        PT Long Term Goals - 12/10/18 2234      PT LONG TERM GOAL #1   Title  Patient demonstrates & verbalizes understanding of ongoing HEP. (All LTGs Target Date: 02/12/2019)    Time  12    Period  Weeks    Status  On-going  Target Date  02/12/19      PT LONG TERM GOAL #2   Title  Berg Balance >45/56 to indicate lower fall risk.    Time  12    Period  Weeks    Status  On-going    Target Date  02/12/19      PT LONG TERM GOAL #3   Title  Timed Up & Go <13.5sec & cognitive TUG <15.5sec without device except prostheses safely to indicate lower fall risk.    Time  12    Period  Weeks    Status  On-going    Target Date  02/12/19      PT LONG TERM GOAL #4   Title  Dynamic Gait Index >/= 19/24 with cane to indicate lower fall risk.    Time  12    Period  Weeks    Status  On-going    Target Date  02/12/19      PT LONG TERM GOAL #5   Title  Patient ambulates 500' with cane & prostheses modified independent for community mobility.    Time  12    Period  Weeks    Status  On-going    Target Date  02/12/19      PT LONG TERM GOAL #6   Title  Patient negotiates stairs 1 rail,  ramps & curbs with cane & prostheses modified independent  for community access.    Time  12    Period  Weeks    Status  On-going    Target Date  02/12/19      PT LONG TERM GOAL #7   Title  Patient ambulates 100' around furniture with prostheses only modified independent.    Time  12    Period  Weeks    Status  On-going    Target Date  02/12/19            Plan - 12/30/18 2249    Clinical Impression Statement  Today's session focused on activities to improve strength/endurance/activity tolerance & balance reactions. He appeared to not require as long of rests between activities today.    PT Frequency  2x / week    PT Duration  12 weeks    PT Treatment/Interventions  ADLs/Self Care Home Management;DME Instruction;Gait training;Stair training;Functional mobility training;Therapeutic activities;Therapeutic exercise;Balance training;Neuromuscular re-education;Patient/family education;Prosthetic Training    PT Next Visit Plan  pt at beach next week, upon return continue towards updated STGs. work on Armed forces operational officer with Plan of Care  Patient       Patient will benefit from skilled therapeutic intervention in order to improve the following deficits and impairments:  Abnormal gait, Decreased activity tolerance, Decreased balance, Decreased endurance, Decreased mobility, Decreased strength, Postural dysfunction, Prosthetic Dependency  Visit Diagnosis: 1. Other abnormalities of gait and mobility   2. Unsteadiness on feet   3. Muscle weakness (generalized)   4. Abnormal posture   5. History of falling        Problem List Patient Active Problem List   Diagnosis Date Noted  . Acute metabolic encephalopathy 28/63/8177  . Stroke ([place]) 10/28/2018  . Middle cerebral artery embolism, left 10/28/2018  . Metabolic acidosis 11/65/7903  . Dehydration 05/24/2018  . Pressure injury of skin 05/24/2018  . Dyspnea 05/23/2018  . Acute respiratory failure with  hypoxia ([place]) 05/23/2018  . Abnormal cardiovascular stress test 12/29/2013  . Awaiting organ transplant 12/09/2013  . Leg ulcer ([place]) 11/17/2013  . Chronic  kidney disease (CKD), stage V ([place]) 11/17/2013  . Diabetes ([place]) 11/17/2013  . Cancer of duodenum ([place]) 11/17/2013  . Dyslipidemia 11/17/2013  . Chronic kidney disease requiring chronic dialysis ([place]) 11/17/2013  . Hypercholesteremia 11/17/2013  . H/O malignant neoplasm 11/17/2013  . H/O: HTN (hypertension) 11/17/2013  . BP (high blood pressure) 11/17/2013  . Hypotension 11/17/2013  . Angiopathy, peripheral ([place]) 11/17/2013  . AF (paroxysmal atrial fibrillation) ([place]) 11/17/2013  . BKA stump complication ([place]) 09/73/5329  . S/P bilateral BKA (below knee amputation) ([place]) 08/03/2013  . S/P BKA (below knee amputation) bilateral ([place]) 07/30/2013  . Open wound of both legs with complication 92/42/6834  . Acute blood loss anemia 07/12/2013  . Syncope 07/09/2013  . Intolerance to CAPD peritoneal dialysis s/p CAPD cath removal 07/09/2013 07/05/2013  . Critical lower limb ischemia 06/30/2013  . Extremity pain 06/30/2013  . Atherosclerosis of native arteries of the extremities with ulceration(440.23) 06/17/2013  . Gait disorder 05/31/2013  . Weakness 05/24/2013  . Depression 04/03/2013  . GERD (gastroesophageal reflux disease) 04/03/2013  . Atrial fibrillation ([place]) 04/02/2013  . DM (diabetes mellitus) ([place]) 12/28/2012  . Other complications due to renal dialysis device, implant, and graft 05/19/2012  . Diabetes mellitus, type 2 ([place]) 11/13/2011  . Anemia 07/02/2011  . ESRD on hemodialysis ([place]) 05/29/2011    Wyona Neils PT, DPT 12/30/2018, 10:52 PM  [facility] [address] [place], [place], [address] Phone: [phone]   Fax:  [phone]  Name: ADRON GEISEL MRN: [id number] Date of Birth: 04/08/1956

## 2019-01-04 ENCOUNTER — Ambulatory Visit: Payer: Managed Care, Other (non HMO) | Admitting: Physical Therapy

## 2019-01-06 ENCOUNTER — Ambulatory Visit: Payer: Managed Care, Other (non HMO) | Admitting: Physical Therapy

## 2019-01-06 ENCOUNTER — Encounter (HOSPITAL_COMMUNITY): Payer: Managed Care, Other (non HMO)

## 2019-01-10 NOTE — Progress Notes (Deleted)
Cardiology Office Note:   Date:  01/10/2019  NAME:  Paul [name]    MRN: [id number] DOB:  04/24/1956   PCP:  [name], MD  Cardiologist:  Evalina Field, MD  Electrophysiologist:  None   Referring MD: [name], MD   No chief complaint on file. ***  History of Present Illness:   Paul [name] is a 63 y.o. male with a hx of atrial fibrillation, ESRD s/p kidney transplant, calciphylaxis, depression, HTN, poorly controlled DM (A1c 13.6), PVD s/p bilateral BKAs who is being seen today for the evaluation of atrial fibrillation at the request of [name], MD.  Past Medical History: Past Medical History:  Diagnosis Date  . A-fib ([place])   . Anemia    esrd  . Calciphylaxis 05/2013   lower ext  . Cancer ([place])    hx duodenal adenoCa 2002, resected  . Closed left arm fracture 1967  . Corneal abrasion, left   . Depression   . Diabetes mellitus    Type 1 iddm x 11 yrs  . Diabetic neuropathy ([place])   . ESRD on hemodialysis ([place])    Pt has ESRD due to DM.  He had a L upper arm AVF prior to starting HD which was ligated due to L arm steal syndrome.  He started HD in Jan 2014 and did home HD.  The family couldn't cannulate the R arm AVF successfully so by mid 2014 they decided to switch to PD which was done late summer 2014.  In Jan 2015 he was admitted with FTT felt to be due to underdialysis on PD and PD was abandoned and he   . GERD (gastroesophageal reflux disease)   . Glomerulosclerosis, diabetic ([place])   . Heart murmur   . History of blood transfusion   . Hypertension    off of meds due to orthostatic hypotension  . Kidney transplant recipient   . Open wound of both legs with complication    Pt has had progressive wounds of both LE's including gangrene of the toes and patchy necrosis of the calves.  He has been treated at [facility] by Dr Jerline Pain with hyperbaric O2 5d per week.  He is getting Na Thiosulfate with HD for suspected calciphylaxis. Pt says doctor's aren't  sure if these ulcers were diabetic ulcers or calciphylaxis.  He underwent bilat BKA on 07/30/13.  He says that the woun    Past Surgical History: Past Surgical History:  Procedure Laterality Date  . AMPUTATION Bilateral 07/30/2013   Procedure: AMPUTATION BELOW KNEE;  Surgeon: Newt Minion, MD;  Location: [facility];  Service: Orthopedics;  Laterality: Bilateral;  Bilateral Below Knee Amputations  . AMPUTATION Bilateral 09/03/2013   Procedure: AMPUTATION BELOW KNEE;  Surgeon: Newt Minion, MD;  Location: [facility];  Service: Orthopedics;  Laterality: Bilateral;  Bilateral Below Knee Amputation Revision  . AV FISTULA PLACEMENT  06/07/2011   Procedure: ARTERIOVENOUS (AV) FISTULA CREATION;  Surgeon: Angelia Mould, MD;  Location: [facility];  Service: Vascular;  Laterality: Left;  . AV FISTULA PLACEMENT  06/18/2012   Procedure: ARTERIOVENOUS (AV) FISTULA CREATION;  Surgeon: Mal Misty, MD;  Location: [facility];  Service: Vascular;  Laterality: Right;  [place]  . BILIARY DIVERSION, EXTERNAL  2002  . BONE MARROW BIOPSY  2012  . CAPD INSERTION N/A 01/14/2013   Procedure: LAPAROSCOPIC INSERTION CONTINUOUS AMBULATORY PERITONEAL DIALYSIS  (CAPD) CATHETER;  Surgeon: Adin Hector, MD;  Location: [facility];  Service: General;  Laterality: N/A;  . CAPD REMOVAL N/A 07/09/2013   Procedure: CONTINUOUS AMBULATORY PERITONEAL DIALYSIS  (CAPD) CATHETER REMOVAL;  Surgeon: Adin Hector, MD;  Location: [facility];  Service: General;  Laterality: N/A;  . COLONOSCOPY     Hx: of  . INSERTION OF DIALYSIS CATHETER  05/22/2012   Procedure: INSERTION OF DIALYSIS CATHETER;  Surgeon: Mal Misty, MD;  Location: [facility];  Service: Vascular;  Laterality: N/A;  right internal jugular vein  . IR ANGIO INTRA EXTRACRAN SEL COM CAROTID INNOMINATE BILAT MOD SED  10/28/2018  . IR ANGIO VERTEBRAL SEL SUBCLAVIAN INNOMINATE UNI R MOD SED  10/28/2018  . IR ANGIO VERTEBRAL SEL VERTEBRAL UNI L MOD SED  10/28/2018  . LIGATION OF ARTERIOVENOUS  FISTULA   05/22/2012   Procedure: LIGATION OF ARTERIOVENOUS  FISTULA;  Surgeon: Mal Misty, MD;  Location: [facility];  Service: Vascular;  Laterality: Left;  Left brachio-cephalic fistula  . LOWER EXTREMITY ANGIOGRAM N/A 06/14/2013   Procedure: LOWER EXTREMITY ANGIOGRAM;  Surgeon: Angelia Mould, MD;  Location: [facility] CATH LAB;  Service: Cardiovascular;  Laterality: N/A;  . NEPHRECTOMY TRANSPLANTED ORGAN    . OTHER SURGICAL HISTORY     Cyst removed from back  . PORTACATH PLACEMENT  2002  . RADIOLOGY WITH ANESTHESIA N/A 10/28/2018   Procedure: IR WITH ANESTHESIA;  Surgeon: Radiologist, Medication, MD;  Location: [facility];  Service: Radiology;  Laterality: N/A;  . REMOVAL OF A DIALYSIS CATHETER    . RENAL BIOPSY, PERCUTANEOUS  2012  . TONSILLECTOMY    . VASCULAR SURGERY    . WHIPPLE PROCEDURE  2002   duodenal ca, Dr. Harlow Asa    Current Medications: No outpatient medications have been marked as taking for the 01/11/19 encounter (Appointment) with O'Neal, Cassie Freer, MD.     Allergies:    Metformin and related   Social History: Social History   Socioeconomic History  . Marital status: Married    Spouse name: Not on file  . Number of children: Not on file  . Years of education: Not on file  . Highest education level: Not on file  Occupational History  . Not on file  Social Needs  . Financial resource strain: Not on file  . Food insecurity    Worry: Not on file    Inability: Not on file  . Transportation needs    Medical: Not on file    Non-medical: Not on file  Tobacco Use  . Smoking status: Never Smoker  . Smokeless tobacco: Never Used  Substance and Sexual Activity  . Alcohol use: No  . Drug use: No  . Sexual activity: Not on file  Lifestyle  . Physical activity    Days per week: Not on file    Minutes per session: Not on file  . Stress: Not on file  Relationships  . Social Herbalist on phone: Not on file    Gets together: Not on file    Attends religious  service: Not on file    Active member of club or organization: Not on file    Attends meetings of clubs or organizations: Not on file    Relationship status: Not on file  Other Topics Concern  . Not on file  Social History Narrative  . Not on file     Family History: The patient's ***family history includes Cancer in his father and mother; Heart disease in his maternal grandfather; Stroke in his paternal grandmother.  ROS:  All other ROS reviewed and negative. Pertinent positives noted in the HPI.     EKGs/Labs/Other Studies Reviewed:   The following studies were personally reviewed by me today:  TTE 05/2018 - Left ventricle: The cavity size was normal. Wall thickness was   normal. Systolic function was normal. The estimated ejection   fraction was in the range of 50% to 55%. Wall motion was normal;   there were no regional wall motion abnormalities. Doppler   parameters are consistent with abnormal left ventricular   relaxation (grade 1 diastolic dysfunction). - Aortic valve: There was trivial regurgitation. - Mitral valve: Calcified annulus.  NM Stress (2015 at [facility])  Myocardial perfusion imaging is normal.  Moderately reduced left ventricular systolic function.  These findings are compatible with a nonischemic cardiomyopathy.  EKG:  EKG is *** ordered today.  The ekg ordered today demonstrates ***, and was personally reviewed by me.   Recent Labs: 05/24/2018: TSH 2.198 10/28/2018: ALT 12 10/31/2018: Magnesium 2.3 11/04/2018: BUN 53; Creatinine, Ser 3.68; Platelets 153; Potassium 4.4; Sodium 137 12/23/2018: Hemoglobin 11.8   Recent Lipid Panel    Component Value Date/Time   CHOL 108 10/29/2018 0530   TRIG 57 10/30/2018 0530   HDL 49 10/29/2018 0530   CHOLHDL 2.2 10/29/2018 0530   VLDL 11 10/29/2018 0530   LDLCALC 48 10/29/2018 0530    Physical Exam:   VS:  There were no vitals taken for this visit.   Wt Readings from Last 3 Encounters:  12/21/18 171 lb 1.3 oz (77.6  kg)  11/04/18 171 lb 1.2 oz (77.6 kg)  05/24/18 164 lb 14.5 oz (74.8 kg)    General: Well nourished, well developed, in no acute distress Heart: Atraumatic, normal size  Eyes: PEERLA, EOMI  Neck: Supple, no JVD Endocrine: No thryomegaly Cardiac: Normal S1, S2; RRR; no murmurs, rubs, or gallops Lungs: Clear to auscultation bilaterally, no wheezing, rhonchi or rales  Abd: Soft, nontender, no hepatomegaly  Ext: No edema, pulses 2+ Musculoskeletal: No deformities, BUE and BLE strength normal and equal Skin: Warm and dry, no rashes   Neuro: Alert and oriented to person, place, time, and situation, CNII-XII grossly intact, no focal deficits  Psych: Normal mood and affect   ASSESSMENT:   NAME@ is a 63 y.o. male who presents for the following: No diagnosis found.  PLAN:   There are no diagnoses linked to this encounter.  Disposition: No follow-ups on file.  Medication Adjustments/Labs and Tests Ordered: Current medicines are reviewed at length with the patient today.  Concerns regarding medicines are outlined above.  No orders of the defined types were placed in this encounter.  No orders of the defined types were placed in this encounter.   There are no Patient Instructions on file for this visit.   Signed, Addison Naegeli. Audie Box, [place]  [address] [place], [place] [address] [phone]  01/10/2019 7:05 PM

## 2019-01-11 ENCOUNTER — Ambulatory Visit: Payer: Managed Care, Other (non HMO) | Admitting: Physical Therapy

## 2019-01-11 ENCOUNTER — Ambulatory Visit: Payer: Managed Care, Other (non HMO) | Admitting: Cardiovascular Disease

## 2019-01-12 ENCOUNTER — Encounter (HOSPITAL_COMMUNITY)
Admission: RE | Admit: 2019-01-12 | Discharge: 2019-01-12 | Disposition: A | Discharge status: Home / Self Care | Payer: Managed Care, Other (non HMO) | Source: Ambulatory Visit | Attending: Nephrology | Admitting: Nephrology

## 2019-01-12 ENCOUNTER — Other Ambulatory Visit: Payer: Self-pay

## 2019-01-12 VITALS — BP 166/72 | HR 57 | Temp 96.4°F | Resp 18

## 2019-01-12 DIAGNOSIS — I10 Essential (primary) hypertension: Secondary | ICD-10-CM | POA: Diagnosis not present

## 2019-01-12 DIAGNOSIS — Z89519 Acquired absence of unspecified leg below knee: Secondary | ICD-10-CM | POA: Diagnosis not present

## 2019-01-12 DIAGNOSIS — Z940 Kidney transplant status: Secondary | ICD-10-CM | POA: Diagnosis not present

## 2019-01-12 DIAGNOSIS — D638 Anemia in other chronic diseases classified elsewhere: Secondary | ICD-10-CM | POA: Diagnosis not present

## 2019-01-12 DIAGNOSIS — R55 Syncope and collapse: Secondary | ICD-10-CM | POA: Diagnosis not present

## 2019-01-12 DIAGNOSIS — K909 Intestinal malabsorption, unspecified: Secondary | ICD-10-CM | POA: Diagnosis not present

## 2019-01-12 LAB — POCT HEMOGLOBIN-HEMACUE: Hemoglobin: 12.4 g/dL — ABNORMAL LOW (ref 13.0–17.0)

## 2019-01-12 LAB — IRON AND TIBC
Iron: 84 ug/dL (ref 45–182)
Saturation Ratios: 55 % — ABNORMAL HIGH (ref 17.9–39.5)
TIBC: 153 ug/dL — ABNORMAL LOW (ref 250–450)
UIBC: 69 ug/dL

## 2019-01-12 LAB — FERRITIN: Ferritin: 284 ng/mL (ref 24–336)

## 2019-01-12 MED ORDER — DARBEPOETIN ALFA 150 MCG/0.3ML IJ SOSY: 150.0000 ug | PREFILLED_SYRINGE | INTRAMUSCULAR | Status: DC

## 2019-01-13 ENCOUNTER — Other Ambulatory Visit: Payer: Self-pay

## 2019-01-13 ENCOUNTER — Ambulatory Visit: Payer: Managed Care, Other (non HMO) | Admitting: Physical Therapy

## 2019-01-13 DIAGNOSIS — R2689 Other abnormalities of gait and mobility: Secondary | ICD-10-CM

## 2019-01-13 NOTE — Therapy (Signed)
[facility] [address] [place], [place], [address] Phone: [phone]   Fax:  [phone]  Patient Details  Name: Paul [name] MRN: [id number] Date of Birth: [birth year] Referring Provider:  [name], MD  Encounter Date: 01/13/2019   01/13/19 1110  Symptoms/Limitations  Subjective Patient [place]. His wife found him unconcious slumped over computer & called "911"  His heart rate was <30bpm so he was shocked, CPR & intubated. When he came around, he reports he was aware of what happened this time.  He has an appointment with his cardiologist on Aug 28.  Pertinent History HTN, DM, renal transplant 05/23/2014, CKD st IV, calciphylaxis, A-Fib, bil. TTAs 07/30/2013,  Patient Stated Goals To stop falls, get stronger, negotiate stairs better, to return to prior functional level, walk in/out of office safely without fatigue issues.  Pain Assessment  Currently in Pain? No/denies    Physical Therapist recommended wife or family not leave him alone until cardiologist sees him.  PT advised pt & wife that it was not safe or appropriate for PT or any exercising / activities beyond walking in home for basic needs until cardiologist assesses him.   HOLD PT until cleared by cardiologist to resume.   Jamey Reas PT, DPT 01/13/2019, 11:34 AM  [facility] [address] [place], [place], [address] Phone: [phone]   Fax:  [phone]

## 2019-01-14 NOTE — Progress Notes (Signed)
Cardiology Office Note:   Date:  01/15/2019  NAME:  Paul [name]    MRN: [id number] DOB:  1955-06-30   PCP:  [name], MD  Cardiologist:  Evalina Field, MD  Electrophysiologist:  None   Referring MD: [name], MD   Chief Complaint  Patient presents with   Loss of Consciousness   History of Present Illness:   Paul [name] is a 63 y.o. male with a hx of hypertension, ESRD status post renal transplant, calciphylaxis, PAD status post bilateral BKA's, stroke/TIA, atrial fibrillation who is being seen today for the evaluation of atrial fibrillation at the request of [name], MD. he presents after 2 hospitalizations for syncope.  He was admitted to [facility] in June of this year with strokelike symptoms that were eventually attributed to hyperglycemia with blood glucose values in the 700.  His symptoms improved and he was discharged home.  He reports another episode of syncope, while at the beach.  He reports he woke up in the morning and began to work at his computer, and just felt lightheaded and his wife found him slumped over.  With each event he reports no symptoms of palpitations, chest pain, shortness of breath prior to the episodes.  He reports he is able to do things around the house without limitations.  During his hospitalization at the beach, he was reported to be bradycardic and his metoprolol was held.  He reports being evaluated by cardiologist and had an echocardiogram done, but they did not place a pacemaker as it was not needed per his report.  His most recent A1c is 13.6 and serum creatinine is 2.82.  He reports no recent changes in medications, or change in overall state of health to explain his symptoms.  He reports he has had issues with high blood pressure as of recently.  It appears that they stopped some of his blood pressure medications and [place].  He was noted to be in atrial fibrillation during his hospitalization in June at [facility], but his EKG today  shows he is in sinus rhythm.  He carried a prior diagnosis of paroxysmal atrial fibrillation, and was taken off blood thinners as he had no further recurrence.  However in light of his recent episode fibrillation during his hospitalization in June at [facility], this should be revisited.  Past Medical History: Past Medical History:  Diagnosis Date   A-fib ([place])    Anemia    esrd   Calciphylaxis 05/2013   lower ext   Cancer [facility])    hx duodenal adenoCa 2002, resected   Closed left arm fracture 1967   Corneal abrasion, left    Depression    Diabetes mellitus    Type 1 iddm x 11 yrs   Diabetic neuropathy (HCC)    ESRD on hemodialysis ([place])    Pt has ESRD due to DM.  He had a L upper arm AVF prior to starting HD which was ligated due to L arm steal syndrome.  He started HD in Jan 2014 and did home HD.  The family couldn't cannulate the R arm AVF successfully so by mid 2014 they decided to switch to PD which was done late summer 2014.  In Jan 2015 he was admitted with FTT felt to be due to underdialysis on PD and PD was abandoned and he    GERD (gastroesophageal reflux disease)    Glomerulosclerosis, diabetic ([place])    Heart murmur    History of blood  transfusion    Hypertension    off of meds due to orthostatic hypotension   Kidney transplant recipient    Open wound of both legs with complication    Pt has had progressive wounds of both LE's including gangrene of the toes and patchy necrosis of the calves.  He has been treated at [facility] by Dr Jerline Pain with hyperbaric O2 5d per week.  He is getting Na Thiosulfate with HD for suspected calciphylaxis. Pt says doctor's aren't sure if these ulcers were diabetic ulcers or calciphylaxis.  He underwent bilat BKA on 07/30/13.  He says that the woun    Past Surgical History: Past Surgical History:  Procedure Laterality Date   AMPUTATION Bilateral 07/30/2013   Procedure: AMPUTATION BELOW KNEE;  Surgeon: Newt Minion, MD;   Location: [place];  Service: Orthopedics;  Laterality: Bilateral;  Bilateral Below Knee Amputations   AMPUTATION Bilateral 09/03/2013   Procedure: AMPUTATION BELOW KNEE;  Surgeon: Newt Minion, MD;  Location: [place];  Service: Orthopedics;  Laterality: Bilateral;  Bilateral Below Knee Amputation Revision   AV FISTULA PLACEMENT  06/07/2011   Procedure: ARTERIOVENOUS (AV) FISTULA CREATION;  Surgeon: Angelia Mould, MD;  Location: [place];  Service: Vascular;  Laterality: Left;   AV FISTULA PLACEMENT  06/18/2012   Procedure: ARTERIOVENOUS (AV) FISTULA CREATION;  Surgeon: Mal Misty, MD;  Location: [place];  Service: Vascular;  Laterality: Right;  [place], EXTERNAL  2002   BONE MARROW BIOPSY  2012   CAPD INSERTION N/A 01/14/2013   Procedure: LAPAROSCOPIC INSERTION CONTINUOUS AMBULATORY PERITONEAL DIALYSIS  (CAPD) CATHETER;  Surgeon: Adin Hector, MD;  Location: [place];  Service: General;  Laterality: N/A;   CAPD REMOVAL N/A 07/09/2013   Procedure: CONTINUOUS AMBULATORY PERITONEAL DIALYSIS  (CAPD) CATHETER REMOVAL;  Surgeon: Adin Hector, MD;  Location: [place];  Service: General;  Laterality: N/A;   COLONOSCOPY     Hx: of   INSERTION OF DIALYSIS CATHETER  05/22/2012   Procedure: INSERTION OF DIALYSIS CATHETER;  Surgeon: Mal Misty, MD;  Location: [place];  Service: Vascular;  Laterality: N/A;  right internal jugular vein   IR ANGIO INTRA EXTRACRAN SEL COM CAROTID INNOMINATE BILAT MOD SED  10/28/2018   IR ANGIO VERTEBRAL SEL SUBCLAVIAN INNOMINATE UNI R MOD SED  10/28/2018   IR ANGIO VERTEBRAL SEL VERTEBRAL UNI L MOD SED  10/28/2018   LIGATION OF ARTERIOVENOUS  FISTULA  05/22/2012   Procedure: LIGATION OF ARTERIOVENOUS  FISTULA;  Surgeon: Mal Misty, MD;  Location: [place];  Service: Vascular;  Laterality: Left;  Left brachio-cephalic fistula   LOWER EXTREMITY ANGIOGRAM N/A 06/14/2013   Procedure: LOWER EXTREMITY ANGIOGRAM;  Surgeon: Angelia Mould, MD;   [facility] C[facility];  Service: Cardiovascular;  Laterality: N/A;   NEPHRECTOMY TRANSPLANTED ORGAN     OTHER SURGICAL HISTORY     Cyst removed from back   [place] WITH ANESTHESIA N/A 10/28/2018   Procedure: IR WITH ANESTHESIA;  Surgeon: Radiologist, Medication, MD;  Location: [place];  Service: Radiology;  Laterality: N/A;   REMOVAL OF A DIALYSIS CATHETER     RENAL BIOPSY, PERCUTANEOUS  2012   TONSILLECTOMY     VASCULAR SURGERY     WHIPPLE PROCEDURE  2002   duodenal ca, Dr. Harlow Asa    Current Medications: Current Meds  Medication Sig   amLODipine (NORVASC) 10 MG tablet Take 10 mg by mouth daily.  aspirin EC 81 MG tablet Take 81 mg by mouth daily.   atorvastatin (LIPITOR) 10 MG tablet Take 1 tablet (10 mg total) by mouth daily.   calcitRIOL (ROCALTROL) 0.25 MCG capsule Take 0.5 mcg by mouth daily.   calcium acetate (PHOSLO) 667 MG capsule Take 1,334 mg by mouth 3 (three) times daily with meals.   CVS D3 50 MCG (2000 UT) CAPS Take 4,000 Units by mouth daily.   HUMALOG KWIKPEN 100 UNIT/ML KwikPen Sliding scale   Insulin Glargine (BASAGLAR KWIKPEN) 100 UNIT/ML SOPN Inject 12 Units into the skin daily.    magnesium chloride (SLOW-MAG) 64 MG TBEC SR tablet Take 2 tablets by mouth 2 (two) times daily. TAKE AT LEAST 2 HOURS SEPARATE FROM PROGRAF/TACROLIMUS   mycophenolate (MYFORTIC) 180 MG EC tablet Take 360 mg by mouth 2 (two) times daily.    omeprazole (PRILOSEC) 40 MG capsule Take 40 mg by mouth daily.    predniSONE (DELTASONE) 5 MG tablet Take 5 mg by mouth daily with breakfast.    sodium bicarbonate 650 MG tablet Take 1,950 mg by mouth 4 (four) times daily.   tacrolimus (PROGRAF) 1 MG capsule Take 3-4 mg by mouth See admin instructions. Take 4 mg by mouth in the morning at 9 AM and 3 mg in the evening at 9 PM   tamsulosin (FLOMAX) 0.4 MG CAPS capsule Take 0.4 mg by mouth daily after breakfast.      Allergies:    Metformin and  related   Social History: Social History   Socioeconomic History   Marital status: Married    Spouse name: Not on file   Number of children: Not on file   Years of education: Not on file   Highest education level: Not on file  Occupational History   Not on file  Social Needs   Financial resource strain: Not on file   Food insecurity    Worry: Not on file    Inability: Not on file   Transportation needs    Medical: Not on file    Non-medical: Not on file  Tobacco Use   Smoking status: Never Smoker   Smokeless tobacco: Never Used  Substance and Sexual Activity   Alcohol use: No   Drug use: No   Sexual activity: Not on file  Lifestyle   Physical activity    Days per week: Not on file    Minutes per session: Not on file   Stress: Not on file  Relationships   Social connections    Talks on phone: Not on file    Gets together: Not on file    Attends religious service: Not on file    Active member of club or organization: Not on file    Attends meetings of clubs or organizations: Not on file    Relationship status: Not on file  Other Topics Concern   Not on file  Social History Narrative   Not on file     Family History: The patient's family history includes Cancer in his father and mother; Heart disease in his maternal grandfather; Stroke in his paternal grandmother.  ROS:   All other ROS reviewed and negative. Pertinent positives noted in the HPI.     EKGs/Labs/Other Studies Reviewed:   The following studies were personally reviewed by me today:  EKG:  EKG is ordered today.  The ekg ordered today demonstrates sinus bradycardia heart rate 50, old anteroseptal infarct, nonspecific T wave changes, no acute ST changes concerning for ischemia,  normal intervals, and was personally reviewed by me.   Recent Labs: 05/24/2018: TSH 2.198 10/28/2018: ALT 12 10/31/2018: Magnesium 2.3 11/04/2018: BUN 53; Creatinine, Ser 3.68; Platelets 153; Potassium 4.4;  Sodium 137 01/12/2019: Hemoglobin 12.4   Recent Lipid Panel    Component Value Date/Time   CHOL 108 10/29/2018 0530   TRIG 57 10/30/2018 0530   HDL 49 10/29/2018 0530   CHOLHDL 2.2 10/29/2018 0530   VLDL 11 10/29/2018 0530   LDLCALC 48 10/29/2018 0530    Physical Exam:   VS:  BP (!) 166/68    Pulse (!) 50    Temp 98.7 F (37.1 C) (Temporal)    Ht 6' (1.829 m)    Wt 178 lb (80.7 kg)    SpO2 98%    BMI 24.14 kg/m    Wt Readings from Last 3 Encounters:  01/15/19 178 lb (80.7 kg)  12/21/18 171 lb 1.3 oz (77.6 kg)  11/04/18 171 lb 1.2 oz (77.6 kg)    General: Well nourished, well developed, in no acute distress Heart: Atraumatic, normal size  Eyes: PEERLA, EOMI  Neck: Supple, no JVD Endocrine: No thryomegaly Cardiac: Normal S1, S2; 2 out of 6 systolic ejection murmur, radiating to carotids Lungs: Clear to auscultation bilaterally, no wheezing, rhonchi or rales  Abd: Soft, nontender, no hepatomegaly  Ext: Status post bilateral BKA Musculoskeletal: No deformities, BUE strength normal and equal Skin: Warm and dry, no rashes   Neuro: Alert and oriented to person, place, time, and situation, CNII-XII grossly intact, no focal deficits  Psych: Normal mood and affect   ASSESSMENT:   NAME@ is a 63 y.o. male who presents for the following: 1. Syncope and collapse   2. AF (paroxysmal atrial fibrillation) ([place])   3. Essential hypertension   4. PAD (peripheral artery disease) ([place])     PLAN:   1. Syncope and collapse -He presents with rather alarming symptoms of syncope and collapse.  1 occurred in June of this year and was associated with significant hyperglycemia.  This most recent episode last week occurred at the beach, without definitive etiology identified.  He was evaluated by cardiology and did not feel he needed a pacemaker.  His metoprolol was held, and I agree with this plan. -The lack of palpitations, chest pain, shortness of breath prior to the episodes makes me think this  is less likely to be cardiac in origin.  He has a structurally normal heart and no prior history of myocardial infarction, to suggest he has underlying ventricular tachycardia/fibrillation to explain his symptoms.  I think this could be related to fluctuations in blood sugar, as this was noted to occur in June of this year.  I have encouraged him to see his endocrinologist to determine if this could be a possibility. -In the interim given the severity of his symptoms that are quite alarming, we will proceed with a Lexiscan nuclear medicine perfusion imaging study -I will also place a 2-week ZIO patch to determine if he is having any arrhythmias.  His ECG just shows sinus bradycardia with no conduction disease.  He is able to do activities around the house without limitations indicating he likely has no evidence of chronotropic incompetence or worsening AV conduction.  If he has no episodes of arrhythmias on his monitor, I think we should have a discussion about a loop recorder given the severity of his symptoms.  In the interim he will remain hydrated and increase his p.o. intake to determine if this could help  with symptoms.  2. AF (paroxysmal atrial fibrillation) ([place]) -He had another episode in June of this year, and appears to be back in sinus rhythm -We will hold his metoprolol due to his bradycardia -I will send a message to his nephrologist to determine if they are okay with restarting his Eliquis -I think this is reasonable given his chadsvasc=4 but I will reach out to his nephrologist to see if this is okay  3. Essential hypertension -High today but will hold medications until we have stress test and monitor completed  4. PAD (peripheral artery disease) ([place]) -Status post bilateral BKA   Disposition: Return in about 6 weeks (around 02/26/2019).  Medication Adjustments/Labs and Tests Ordered: Current medicines are reviewed at length with the patient today.  Concerns regarding medicines are  outlined above.  Orders Placed This Encounter  Procedures   MYOCARDIAL PERFUSION IMAGING   LONG TERM MONITOR (3-14 DAYS)   EKG 12-Lead   No orders of the defined types were placed in this encounter.   Patient Instructions  Medication Instructions:   No changes      Lab work:  Not needed     Testing/Procedures: Will be schedule at [place] has requested that you have a lexiscan myoview. For further information please visit [email or web address]. Please follow instruction sheet, as given.  And Your physician has recommended that you wear a  14 DAY ZIO-PATCH monitor. The Zio patch cardiac monitor continuously records heart rhythm data for up to 14 days, this is for patients being evaluated for multiple types heart rhythms. For the first 24 hours post application, please avoid getting the Zio monitor wet in the shower or by excessive sweating during exercise. After that, feel free to carry on with regular activities. Keep soaps and lotions away from the ZIO XT Patch.  This will be placed at our [facility] location - [address].        Follow-Up: At [facility], you and your health needs are our priority.  As part of our continuing mission to provide you with exceptional heart care, we have created designated Provider Care Teams.  These Care Teams include your primary Cardiologist (physician) and Advanced Practice Providers (APPs -  Physician Assistants and Nurse Practitioners) who all work together to provide you with the care you need, when you need it.  Dr Audie Box recommends that you schedule a follow-up appointment in 4- 6 weeks     Any Other Special Instructions Will Be Listed Below (If Applicable).    Signed, Addison Naegeli. Audie Box, [place]  [address], [place], [place] [address] [phone]  01/15/2019 3:45 PM

## 2019-01-15 ENCOUNTER — Ambulatory Visit (INDEPENDENT_AMBULATORY_CARE_PROVIDER_SITE_OTHER): Payer: Managed Care, Other (non HMO) | Admitting: Cardiovascular Disease

## 2019-01-15 ENCOUNTER — Other Ambulatory Visit: Payer: Self-pay

## 2019-01-15 ENCOUNTER — Encounter: Payer: Self-pay | Admitting: Cardiovascular Disease

## 2019-01-15 VITALS — BP 166/68 | HR 50 | Temp 98.7°F | Ht 72.0 in | Wt 178.0 lb

## 2019-01-15 DIAGNOSIS — I739 Peripheral vascular disease, unspecified: Secondary | ICD-10-CM

## 2019-01-15 DIAGNOSIS — I480 Paroxysmal atrial fibrillation: Secondary | ICD-10-CM | POA: Diagnosis not present

## 2019-01-15 DIAGNOSIS — R55 Syncope and collapse: Secondary | ICD-10-CM | POA: Diagnosis not present

## 2019-01-15 DIAGNOSIS — I10 Essential (primary) hypertension: Secondary | ICD-10-CM | POA: Diagnosis not present

## 2019-01-15 NOTE — Patient Instructions (Addendum)
Medication Instructions:   No changes      Lab work:  Not needed     Testing/Procedures: Will be schedule at [place] has requested that you have a lexiscan myoview. For further information please visit [email or web address]. Please follow instruction sheet, as given.  And Your physician has recommended that you wear a  14 DAY ZIO-PATCH monitor. The Zio patch cardiac monitor continuously records heart rhythm data for up to 14 days, this is for patients being evaluated for multiple types heart rhythms. For the first 24 hours post application, please avoid getting the Zio monitor wet in the shower or by excessive sweating during exercise. After that, feel free to carry on with regular activities. Keep soaps and lotions away from the ZIO XT Patch.  This will be placed at our [facility] location - [address].        Follow-Up: At [facility], you and your health needs are our priority.  As part of our continuing mission to provide you with exceptional heart care, we have created designated Provider Care Teams.  These Care Teams include your primary Cardiologist (physician) and Advanced Practice Providers (APPs -  Physician Assistants and Nurse Practitioners) who all work together to provide you with the care you need, when you need it. . Dr Audie Box recommends that you schedule a follow-up appointment in 4- 6 weeks     Any Other Special Instructions Will Be Listed Below (If Applicable).

## 2019-01-17 ENCOUNTER — Telehealth: Payer: Self-pay | Admitting: Physical Therapy

## 2019-01-17 NOTE — Telephone Encounter (Signed)
Dr. Audie Box Paul [name] is receiving PT following his June hospitalization for encephalopathy. PT was held last week after his syncopal episode at the beach. Do you feel it is safe to resume PT? He has a PT appointment on 01/18/2019 at 10:15am. Thank you Paul [name], PT, DPT PT Specializing in [place] 01/17/2019@ 8:33 PM Phone:  [phone]  Fax:  [phone] [place] [address] [place], [place] [address]

## 2019-01-18 ENCOUNTER — Ambulatory Visit: Payer: Managed Care, Other (non HMO) | Admitting: Physical Therapy

## 2019-01-18 ENCOUNTER — Telehealth: Payer: Self-pay | Admitting: *Deleted

## 2019-01-18 NOTE — Telephone Encounter (Signed)
14 day ZIO XT long term holter monitor to be mailed to patients home.  Patient instructed not to apply monitor until after his Nuclear medicine study on 01/21/19.  Instructions reviewed briefly as they are included in the monitor kit.

## 2019-01-19 ENCOUNTER — Telehealth (HOSPITAL_COMMUNITY): Payer: Self-pay

## 2019-01-19 NOTE — Telephone Encounter (Signed)
Spoke with patient's wife. Instructions given and she stated that she understood and would have him here. S.Sophy Mesler EMTP

## 2019-01-20 ENCOUNTER — Ambulatory Visit: Payer: Managed Care, Other (non HMO) | Admitting: Physical Therapy

## 2019-01-21 ENCOUNTER — Other Ambulatory Visit: Payer: Self-pay

## 2019-01-21 ENCOUNTER — Ambulatory Visit (HOSPITAL_COMMUNITY): Payer: Managed Care, Other (non HMO) | Attending: Cardiology

## 2019-01-21 DIAGNOSIS — R55 Syncope and collapse: Secondary | ICD-10-CM

## 2019-01-21 DIAGNOSIS — I10 Essential (primary) hypertension: Secondary | ICD-10-CM

## 2019-01-21 DIAGNOSIS — I739 Peripheral vascular disease, unspecified: Secondary | ICD-10-CM | POA: Diagnosis not present

## 2019-01-21 DIAGNOSIS — I480 Paroxysmal atrial fibrillation: Secondary | ICD-10-CM | POA: Diagnosis not present

## 2019-01-21 LAB — MYOCARDIAL PERFUSION IMAGING
LV dias vol: 159 mL (ref 62–150)
LV sys vol: 86 mL
Peak HR: 63 {beats}/min
Rest HR: 50 {beats}/min
SDS: 0
SRS: 0
SSS: 0
TID: 1.1

## 2019-01-21 MED ORDER — TECHNETIUM TC 99M TETROFOSMIN IV KIT
31.0000 | PACK | Freq: Once | INTRAVENOUS | Status: AC | PRN
  Administered 2019-01-21: 31 via INTRAVENOUS
  Filled 2019-01-21: qty 31

## 2019-01-21 MED ORDER — REGADENOSON 0.4 MG/5ML IV SOLN
0.4000 mg | Freq: Once | INTRAVENOUS | Status: AC
  Administered 2019-01-21: 0.4 mg via INTRAVENOUS

## 2019-01-21 MED ORDER — TECHNETIUM TC 99M TETROFOSMIN IV KIT
10.5000 | PACK | Freq: Once | INTRAVENOUS | Status: AC | PRN
  Administered 2019-01-21: 10.5 via INTRAVENOUS
  Filled 2019-01-21: qty 11

## 2019-01-22 ENCOUNTER — Telehealth: Payer: Self-pay | Admitting: Cardiovascular Disease

## 2019-01-22 ENCOUNTER — Other Ambulatory Visit: Payer: Self-pay

## 2019-01-22 DIAGNOSIS — R011 Cardiac murmur, unspecified: Secondary | ICD-10-CM

## 2019-01-22 DIAGNOSIS — I480 Paroxysmal atrial fibrillation: Secondary | ICD-10-CM

## 2019-01-22 NOTE — Telephone Encounter (Signed)
LVM for patient to call and schedule his echo.

## 2019-01-22 NOTE — Telephone Encounter (Signed)
Called and informed [name] that his stress shows no evidence of blockages. Abnormal wall motion seen on that study. He has had normal echoes in the past. I will plan to repeat an echo.   Evalina Field, MD

## 2019-01-24 DIAGNOSIS — R404 Transient alteration of awareness: Secondary | ICD-10-CM | POA: Diagnosis not present

## 2019-01-24 DIAGNOSIS — I491 Atrial premature depolarization: Secondary | ICD-10-CM | POA: Diagnosis not present

## 2019-01-24 DIAGNOSIS — E161 Other hypoglycemia: Secondary | ICD-10-CM | POA: Diagnosis not present

## 2019-01-24 DIAGNOSIS — E162 Hypoglycemia, unspecified: Secondary | ICD-10-CM | POA: Diagnosis not present

## 2019-01-24 DIAGNOSIS — R4020 Unspecified coma: Secondary | ICD-10-CM | POA: Diagnosis not present

## 2019-01-26 ENCOUNTER — Other Ambulatory Visit: Payer: Self-pay

## 2019-01-26 ENCOUNTER — Ambulatory Visit (HOSPITAL_COMMUNITY)
Admission: RE | Admit: 2019-01-26 | Discharge: 2019-01-26 | Disposition: A | Discharge status: Home / Self Care | Payer: Managed Care, Other (non HMO) | Source: Ambulatory Visit | Attending: Nephrology | Admitting: Nephrology

## 2019-01-26 VITALS — BP 155/74 | HR 66 | Resp 18

## 2019-01-26 DIAGNOSIS — D638 Anemia in other chronic diseases classified elsewhere: Secondary | ICD-10-CM | POA: Diagnosis present

## 2019-01-26 LAB — POCT HEMOGLOBIN-HEMACUE: Hemoglobin: 14.3 g/dL (ref 13.0–17.0)

## 2019-01-26 MED ORDER — DARBEPOETIN ALFA 150 MCG/0.3ML IJ SOSY: 150.0000 ug | PREFILLED_SYRINGE | INTRAMUSCULAR | Status: DC

## 2019-01-28 ENCOUNTER — Ambulatory Visit (HOSPITAL_COMMUNITY): Payer: Managed Care, Other (non HMO) | Attending: Internal Medicine

## 2019-01-28 ENCOUNTER — Other Ambulatory Visit: Payer: Self-pay

## 2019-01-28 DIAGNOSIS — R011 Cardiac murmur, unspecified: Secondary | ICD-10-CM

## 2019-01-28 DIAGNOSIS — I480 Paroxysmal atrial fibrillation: Secondary | ICD-10-CM | POA: Diagnosis not present

## 2019-02-09 ENCOUNTER — Ambulatory Visit (HOSPITAL_COMMUNITY)
Admission: RE | Admit: 2019-02-09 | Discharge: 2019-02-09 | Disposition: A | Discharge status: Home / Self Care | Payer: Managed Care, Other (non HMO) | Source: Ambulatory Visit | Attending: Nephrology | Admitting: Nephrology

## 2019-02-09 ENCOUNTER — Other Ambulatory Visit: Payer: Self-pay

## 2019-02-09 VITALS — BP 176/78 | HR 68 | Resp 20

## 2019-02-09 DIAGNOSIS — D638 Anemia in other chronic diseases classified elsewhere: Secondary | ICD-10-CM | POA: Diagnosis present

## 2019-02-09 LAB — IRON AND TIBC
Iron: 127 ug/dL (ref 45–182)
Saturation Ratios: 86 % — ABNORMAL HIGH (ref 17.9–39.5)
TIBC: 147 ug/dL — ABNORMAL LOW (ref 250–450)
UIBC: 20 ug/dL

## 2019-02-09 LAB — POCT HEMOGLOBIN-HEMACUE: Hemoglobin: 11.6 g/dL — ABNORMAL LOW (ref 13.0–17.0)

## 2019-02-09 LAB — FERRITIN: Ferritin: 518 ng/mL — ABNORMAL HIGH (ref 24–336)

## 2019-02-09 MED ORDER — DARBEPOETIN ALFA 150 MCG/0.3ML IJ SOSY
PREFILLED_SYRINGE | INTRAMUSCULAR | Status: AC
  Filled 2019-02-09: qty 0.3

## 2019-02-09 MED ORDER — DARBEPOETIN ALFA 150 MCG/0.3ML IJ SOSY
150.0000 ug | PREFILLED_SYRINGE | INTRAMUSCULAR | Status: DC
  Administered 2019-02-09: 150 ug via SUBCUTANEOUS

## 2019-02-15 ENCOUNTER — Ambulatory Visit
Admission: RE | Admit: 2019-02-15 | Discharge: 2019-02-15 | Disposition: A | Discharge status: Home / Self Care | Payer: Managed Care, Other (non HMO) | Source: Ambulatory Visit | Attending: Nephrology | Admitting: Nephrology

## 2019-02-15 ENCOUNTER — Other Ambulatory Visit: Payer: Self-pay

## 2019-02-15 DIAGNOSIS — Z940 Kidney transplant status: Secondary | ICD-10-CM

## 2019-02-23 ENCOUNTER — Other Ambulatory Visit: Payer: Self-pay

## 2019-02-23 ENCOUNTER — Ambulatory Visit (HOSPITAL_COMMUNITY)
Admission: RE | Admit: 2019-02-23 | Discharge: 2019-02-23 | Disposition: A | Discharge status: Home / Self Care | Payer: Managed Care, Other (non HMO) | Source: Ambulatory Visit | Attending: Nephrology | Admitting: Nephrology

## 2019-02-23 VITALS — BP 175/90 | HR 86 | Temp 97.2°F | Resp 18

## 2019-02-23 DIAGNOSIS — D638 Anemia in other chronic diseases classified elsewhere: Secondary | ICD-10-CM | POA: Insufficient documentation

## 2019-02-23 LAB — POCT HEMOGLOBIN-HEMACUE: Hemoglobin: 13.3 g/dL (ref 13.0–17.0)

## 2019-02-23 MED ORDER — DARBEPOETIN ALFA 150 MCG/0.3ML IJ SOSY: 150.0000 ug | PREFILLED_SYRINGE | INTRAMUSCULAR | Status: DC

## 2019-02-23 MED ORDER — DARBEPOETIN ALFA 150 MCG/0.3ML IJ SOSY
PREFILLED_SYRINGE | INTRAMUSCULAR | Status: AC
  Filled 2019-02-23: qty 0.3

## 2019-02-25 NOTE — Progress Notes (Signed)
Cardiology Office Note:   Date:  02/26/2019  NAME:  Paul [name]    MRN: [id number] DOB:  16-Feb-1956   PCP:  [name], MD  Cardiologist:  Evalina Field, MD  Electrophysiologist:  None   Referring MD: [name], MD   Chief Complaint  Patient presents with  . Loss of Consciousness   History of Present Illness:   Paul [name] is a 63 y.o. male with a hx of hypertension, ESRD status post renal transplant, calciphylaxis, PAD status post bilateral BKA's, stroke/TIA, atrial fibrillation who presents for follow-up of syncope. Recent echo with normal EF and mild AS. NM perfusion study normal.  His ZIO Patch has been returned to the company, however have not seen the report yet.  He reports he did have an episode of syncope on 01/24/2019 while wearing the monitor.  He reports EMS was called, and he was found to have hypoglycemia with blood sugar in the 40s.  He reports that his morning sugars have been quite low, and his endocrinologist has adjusted his doses of medicines.  He also discussed with his nephrologist if Eliquis would be okay, and they reported this will be okay.  Blood pressure is a bit elevated today, and he was started on hydralazine 50 mg twice daily by his nephrologist.  This was done few days ago.  He reports he is not had any syncopal event since 9/6.  He has no major physical limitations such as chest pain or shortness of breath.  Overall he seems to be doing a little better.  Past Medical History: Past Medical History:  Diagnosis Date  . A-fib ([place])   . Anemia    esrd  . Calciphylaxis 05/2013   lower ext  . Cancer ([place])    hx duodenal adenoCa 2002, resected  . Closed left arm fracture 1967  . Corneal abrasion, left   . Depression   . Diabetes mellitus    Type 1 iddm x 11 yrs  . Diabetic neuropathy ([place])   . ESRD on hemodialysis ([place])    Pt has ESRD due to DM.  He had a L upper arm AVF prior to starting HD which was ligated due to L arm steal syndrome.  He  started HD in Jan 2014 and did home HD.  The family couldn't cannulate the R arm AVF successfully so by mid 2014 they decided to switch to PD which was done late summer 2014.  In Jan 2015 he was admitted with FTT felt to be due to underdialysis on PD and PD was abandoned and he   . GERD (gastroesophageal reflux disease)   . Glomerulosclerosis, diabetic ([place])   . Heart murmur   . History of blood transfusion   . Hypertension    off of meds due to orthostatic hypotension  . Kidney transplant recipient   . Open wound of both legs with complication    Pt has had progressive wounds of both LE's including gangrene of the toes and patchy necrosis of the calves.  He has been treated at [facility] by Dr Jerline Pain with hyperbaric O2 5d per week.  He is getting Na Thiosulfate with HD for suspected calciphylaxis. Pt says doctor's aren't sure if these ulcers were diabetic ulcers or calciphylaxis.  He underwent bilat BKA on 07/30/13.  He says that the woun    Past Surgical History: Past Surgical History:  Procedure Laterality Date  . AMPUTATION Bilateral 07/30/2013   Procedure: AMPUTATION BELOW KNEE;  Surgeon:  Newt Minion, MD;  Location: [place];  Service: Orthopedics;  Laterality: Bilateral;  Bilateral Below Knee Amputations  . AMPUTATION Bilateral 09/03/2013   Procedure: AMPUTATION BELOW KNEE;  Surgeon: Newt Minion, MD;  Location: [place];  Service: Orthopedics;  Laterality: Bilateral;  Bilateral Below Knee Amputation Revision  . AV FISTULA PLACEMENT  06/07/2011   Procedure: ARTERIOVENOUS (AV) FISTULA CREATION;  Surgeon: Angelia Mould, MD;  Location: [place];  Service: Vascular;  Laterality: Left;  . AV FISTULA PLACEMENT  06/18/2012   Procedure: ARTERIOVENOUS (AV) FISTULA CREATION;  Surgeon: Mal Misty, MD;  Location: [place];  Service: Vascular;  Laterality: Right;  [place]  . BILIARY DIVERSION, EXTERNAL  2002  . BONE MARROW BIOPSY  2012  . CAPD INSERTION N/A 01/14/2013   Procedure: LAPAROSCOPIC  INSERTION CONTINUOUS AMBULATORY PERITONEAL DIALYSIS  (CAPD) CATHETER;  Surgeon: Adin Hector, MD;  Location: [place];  Service: General;  Laterality: N/A;  . CAPD REMOVAL N/A 07/09/2013   Procedure: CONTINUOUS AMBULATORY PERITONEAL DIALYSIS  (CAPD) CATHETER REMOVAL;  Surgeon: Adin Hector, MD;  Location: [place];  Service: General;  Laterality: N/A;  . COLONOSCOPY     Hx: of  . INSERTION OF DIALYSIS CATHETER  05/22/2012   Procedure: INSERTION OF DIALYSIS CATHETER;  Surgeon: Mal Misty, MD;  Location: [place];  Service: Vascular;  Laterality: N/A;  right internal jugular vein  . IR ANGIO INTRA EXTRACRAN SEL COM CAROTID INNOMINATE BILAT MOD SED  10/28/2018  . IR ANGIO VERTEBRAL SEL SUBCLAVIAN INNOMINATE UNI R MOD SED  10/28/2018  . IR ANGIO VERTEBRAL SEL VERTEBRAL UNI L MOD SED  10/28/2018  . LIGATION OF ARTERIOVENOUS  FISTULA  05/22/2012   Procedure: LIGATION OF ARTERIOVENOUS  FISTULA;  Surgeon: Mal Misty, MD;  Location: [place];  Service: Vascular;  Laterality: Left;  Left brachio-cephalic fistula  . LOWER EXTREMITY ANGIOGRAM N/A 06/14/2013   Procedure: LOWER EXTREMITY ANGIOGRAM;  Surgeon: Angelia Mould, MD;  Location: [facility] C[facility] LAB;  Service: Cardiovascular;  Laterality: N/A;  . NEPHRECTOMY TRANSPLANTED ORGAN    . OTHER SURGICAL HISTORY     Cyst removed from back  . PORTACATH PLACEMENT  2002  . RADIOLOGY WITH ANESTHESIA N/A 10/28/2018   Procedure: IR WITH ANESTHESIA;  Surgeon: Radiologist, Medication, MD;  Location: [place];  Service: Radiology;  Laterality: N/A;  . REMOVAL OF A DIALYSIS CATHETER    . RENAL BIOPSY, PERCUTANEOUS  2012  . TONSILLECTOMY    . VASCULAR SURGERY    . WHIPPLE PROCEDURE  2002   duodenal ca, Dr. Harlow Asa    Current Medications: Current Meds  Medication Sig  . amLODipine (NORVASC) 10 MG tablet Take 10 mg by mouth daily.  [name] aspirin EC 81 MG tablet Take 81 mg by mouth daily.  [name] atorvastatin (LIPITOR) 10 MG tablet Take 1 tablet (10 mg total) by mouth daily.  .  calcitRIOL (ROCALTROL) 0.25 MCG capsule Take 0.5 mcg by mouth daily.  . calcium acetate (PHOSLO) 667 MG capsule Take 1,334 mg by mouth 3 (three) times daily with meals.  . CVS D3 50 MCG (2000 UT) CAPS Take 4,000 Units by mouth daily.  [name] HUMALOG KWIKPEN 100 UNIT/ML KwikPen Sliding scale  . hydrALAZINE (APRESOLINE) 50 MG tablet Take 1 tablet by mouth 2 (two) times daily.  . Insulin Glargine (BASAGLAR KWIKPEN) 100 UNIT/ML SOPN Inject 10 Units into the skin daily.   . magnesium chloride (SLOW-MAG) 64 MG TBEC SR tablet Take 2 tablets by mouth  2 (two) times daily. TAKE AT LEAST 2 HOURS SEPARATE FROM PROGRAF/TACROLIMUS  . mycophenolate (MYFORTIC) 180 MG EC tablet Take 360 mg by mouth 2 (two) times daily.   [name] omeprazole (PRILOSEC) 40 MG capsule Take 40 mg by mouth daily.   . predniSONE (DELTASONE) 5 MG tablet Take 5 mg by mouth daily with breakfast.   . sodium bicarbonate 650 MG tablet Take 1,950 mg by mouth 4 (four) times daily.  . tacrolimus (PROGRAF) 1 MG capsule Take 3-4 mg by mouth See admin instructions. Take 4 mg by mouth in the morning at 9 AM and 3 mg in the evening at 9 PM  . tamsulosin (FLOMAX) 0.4 MG CAPS capsule Take 0.4 mg by mouth daily after breakfast.      Allergies:    Metformin and related   Social History: Social History   Socioeconomic History  . Marital status: Married    Spouse name: Not on file  . Number of children: Not on file  . Years of education: Not on file  . Highest education level: Not on file  Occupational History  . Not on file  Social Needs  . Financial resource strain: Not on file  . Food insecurity    Worry: Not on file    Inability: Not on file  . Transportation needs    Medical: Not on file    Non-medical: Not on file  Tobacco Use  . Smoking status: Never Smoker  . Smokeless tobacco: Never Used  Substance and Sexual Activity  . Alcohol use: No  . Drug use: No  . Sexual activity: Not on file  Lifestyle  . Physical activity    Days per week:  Not on file    Minutes per session: Not on file  . Stress: Not on file  Relationships  . Social Herbalist on phone: Not on file    Gets together: Not on file    Attends religious service: Not on file    Active member of club or organization: Not on file    Attends meetings of clubs or organizations: Not on file    Relationship status: Not on file  Other Topics Concern  . Not on file  Social History Narrative  . Not on file     Family History: The patient's family history includes Cancer in his father and mother; Heart disease in his maternal grandfather; Stroke in his paternal grandmother.  ROS:   All other ROS reviewed and negative. Pertinent positives noted in the HPI.     EKGs/Labs/Other Studies Reviewed:   The following studies were personally reviewed by me today:  EKG:   EKG dated 01/15/2019 demonstrates sinus bradycardia heart rate 50, prior anteroseptal infarct, no acute ST-T changes.  TTE 01/28/2019  1. The left ventricle has normal systolic function with an ejection fraction of 60-65%. The cavity size was normal. Left ventricular diastolic Doppler parameters are consistent with impaired relaxation.  2. The right ventricle has normal systolic function. The cavity was normal. There is no increase in right ventricular wall thickness.  3. The aortic valve is tricuspid. Mild thickening of the aortic valve. Mild calcification of the aortic valve. Mild stenosis of the aortic valve.  4. Peak and mean gradients through the AV are 23 and 12 mm Hg respectively consistent with mild AS. Since echo from Jan 2020, gradients are mildly increased.  NM Stress 01/21/2019  Nuclear stress EF: 46%.  There was no ST segment deviation noted during  stress.  The left ventricular ejection fraction is mildly decreased (45-54%).  Mild wall motion abnormalities of anterior and anterolateral wall on this imaging. Normal perfusion in this area. Would consider echo for formal evaluation of  wall motion and EF.  No prior study for comparison.   Recent Labs: 05/24/2018: TSH 2.198 10/28/2018: ALT 12 10/31/2018: Magnesium 2.3 11/04/2018: BUN 53; Creatinine, Ser 3.68; Platelets 153; Potassium 4.4; Sodium 137 02/23/2019: Hemoglobin 13.3   Recent Lipid Panel    Component Value Date/Time   CHOL 108 10/29/2018 0530   TRIG 57 10/30/2018 0530   HDL 49 10/29/2018 0530   CHOLHDL 2.2 10/29/2018 0530   VLDL 11 10/29/2018 0530   LDLCALC 48 10/29/2018 0530    Physical Exam:   VS:  BP (!) 172/79   Pulse 72   Temp (!) 97.2 F (36.2 C)   Ht 6' (1.829 m)   Wt 170 lb 6.4 oz (77.3 kg)   SpO2 98%   BMI 23.11 kg/m    Wt Readings from Last 3 Encounters:  02/26/19 170 lb 6.4 oz (77.3 kg)  01/21/19 178 lb (80.7 kg)  01/15/19 178 lb (80.7 kg)    General: Well nourished, well developed, in no acute distress Heart: Atraumatic, normal size  Eyes: PEERLA, EOMI  Neck: Supple, no JVD Endocrine: No thryomegaly Cardiac: 2 out of 6 systolic ejection murmur best heard at the apex Lungs: Clear to auscultation bilaterally, no wheezing, rhonchi or rales  Abd: Soft, nontender, no hepatomegaly  Ext: Status post bilateral BKA Musculoskeletal: No deformities, BUE strength normal and equal Skin: Warm and dry, no rashes   Neuro: Alert and oriented to person, place, time, and situation, CNII-XII grossly intact, no focal deficits  Psych: Normal mood and affect   ASSESSMENT:   NAME@ is a 63 y.o. male who presents for the following: 1. Syncope and collapse   2. Paroxysmal atrial fibrillation (HCC)   3. Essential hypertension   4. PAD (peripheral artery disease) ([place])   5. Nonrheumatic aortic valve stenosis     PLAN:   1. Syncope and collapse -Normal echo and normal myocardial perfusion imaging study -He had a recent episode and this was in the setting of hypoglycemia.  It is possible that all of his episodes have been related to this.  I am still waiting for his ZIO Patch to come back so I can  interpret this.  His most recent syncopal episode was during the spell on 9/6 and I will make sure to comment on this specifically.  2. Paroxysmal atrial fibrillation ([place]) -He discussed with his nephrologist about Eliquis and they are okay with this -We will start him on Eliquis 5 mg twice daily  3. Essential hypertension -A bit elevated today, but just recently started hydralazine -I would not make any changes today  4. PAD (peripheral artery disease) (HCC) -We will continue aspirin statin -Most recent LDL cholesterol 43 at goal  5. Nonrheumatic aortic valve stenosis -Mild aortic stenosis seen on echocardiogram will need a follow-up in 3 to 5 years   Disposition: Return in about 3 months (around 05/29/2019).  Medication Adjustments/Labs and Tests Ordered: Current medicines are reviewed at length with the patient today.  Concerns regarding medicines are outlined above.  No orders of the defined types were placed in this encounter.  Meds ordered this encounter  Medications  . apixaban (ELIQUIS) 5 MG TABS tablet    Sig: Take 1 tablet (5 mg total) by mouth 2 (two) times daily.  Dispense:  60 tablet    Refill:  4    Patient Instructions  Medication Instructions:  START ELIQUIS '5MG'$  TWICE DAILY If you need a refill on your cardiac medications before your next appointment, please call your pharmacy.  Follow-Up: You will need a follow up appointment in 3 months.  You may see Evalina Field, MD At [facility], you and your health needs are our priority.  As part of our continuing mission to provide you with exceptional heart care, we have created designated Provider Care Teams.  These Care Teams include your primary Cardiologist (physician) and Advanced Practice Providers (APPs -  Physician Assistants and Nurse Practitioners) who all work together to provide you with the care you need, when you need it.  Thank you for choosing [facility] at [facility],  [place] [address], [place]  [address] [place], [place] [address] [phone]  02/26/2019 10:37 AM

## 2019-02-26 ENCOUNTER — Ambulatory Visit (INDEPENDENT_AMBULATORY_CARE_PROVIDER_SITE_OTHER): Payer: Managed Care, Other (non HMO) | Admitting: Cardiovascular Disease

## 2019-02-26 ENCOUNTER — Encounter: Payer: Self-pay | Admitting: Cardiovascular Disease

## 2019-02-26 ENCOUNTER — Other Ambulatory Visit: Payer: Self-pay

## 2019-02-26 VITALS — BP 172/79 | HR 72 | Temp 97.2°F | Ht 72.0 in | Wt 170.4 lb

## 2019-02-26 DIAGNOSIS — I10 Essential (primary) hypertension: Secondary | ICD-10-CM | POA: Diagnosis not present

## 2019-02-26 DIAGNOSIS — R55 Syncope and collapse: Secondary | ICD-10-CM | POA: Diagnosis not present

## 2019-02-26 DIAGNOSIS — I480 Paroxysmal atrial fibrillation: Secondary | ICD-10-CM

## 2019-02-26 DIAGNOSIS — I739 Peripheral vascular disease, unspecified: Secondary | ICD-10-CM

## 2019-02-26 DIAGNOSIS — I350 Nonrheumatic aortic (valve) stenosis: Secondary | ICD-10-CM

## 2019-02-26 MED ORDER — APIXABAN 5 MG PO TABS: 5.0000 mg | ORAL_TABLET | Freq: Two times a day (BID) | ORAL | 4 refills | Status: DC

## 2019-02-26 NOTE — Patient Instructions (Signed)
Medication Instructions:  START ELIQUIS 5MG  TWICE DAILY If you need a refill on your cardiac medications before your next appointment, please call your pharmacy.  Follow-Up: You will need a follow up appointment in 3 months.  You may see Evalina Field, MD At [facility], you and your health needs are our priority.  As part of our continuing mission to provide you with exceptional heart care, we have created designated Provider Care Teams.  These Care Teams include your primary Cardiologist (physician) and Advanced Practice Providers (APPs -  Physician Assistants and Nurse Practitioners) who all work together to provide you with the care you need, when you need it.  Thank you for choosing [facility] at [facility]!!

## 2019-03-09 ENCOUNTER — Encounter (HOSPITAL_COMMUNITY): Payer: Managed Care, Other (non HMO)

## 2019-03-09 ENCOUNTER — Telehealth: Payer: Self-pay | Admitting: *Deleted

## 2019-03-09 NOTE — Telephone Encounter (Signed)
[employer] was called 03/09/19 to inquire about ZIO patch monitor results for monitor returned 02/11/19.  [employer] representative stated there was no data on the monitor when it was uploaded.  Patient will not be charged for this monitor. Patient enrolled for a second monitor to be mailed to his home.  LMVM with monitor instructions.

## 2019-03-11 ENCOUNTER — Telehealth: Payer: Self-pay | Admitting: Cardiovascular Disease

## 2019-03-11 NOTE — Telephone Encounter (Signed)
Patient wife called back- she states he wore monitor but still does not have results. Will route to [facility] to see if she is aware, as she was contacted them before regarding this.

## 2019-03-11 NOTE — Telephone Encounter (Signed)
Mrs. Och was contacted 03/09/19 [facility] regarding monitor.  Message reiterated.  We were contacted 03/08/19 by Dr. Audie Box inquiring about test results.  On 03/09/19 [employer] was called to inquire about test results.  Monitor which was returned 02/11/2019 did not have any data on it.  Patient will not be charged for that monitor.  Patient was re-enrolled on 03/09/19 for a second monitor to be mailed to the patients home.  Reviewed instructions on how to start monitor.  Click button in center of patch after patch applied to the upper left chest.  A small green light will blink 2-3 times.  This will be your only indicator the monitor has been start.  Call [employer] immediately if you see a orange light blinking.  This is an indicator that the monitor in not working properly.

## 2019-03-11 NOTE — Telephone Encounter (Signed)
New Message      Pts wife Is calling to ask about the monitor results. They have not yet received them.    Please call

## 2019-03-23 ENCOUNTER — Ambulatory Visit (HOSPITAL_COMMUNITY)
Admission: RE | Admit: 2019-03-23 | Discharge: 2019-03-23 | Disposition: A | Discharge status: Home / Self Care | Payer: Managed Care, Other (non HMO) | Source: Ambulatory Visit | Attending: Nephrology | Admitting: Nephrology

## 2019-03-23 ENCOUNTER — Other Ambulatory Visit: Payer: Self-pay

## 2019-03-23 VITALS — BP 132/60 | HR 77 | Temp 97.1°F | Resp 18

## 2019-03-23 DIAGNOSIS — D638 Anemia in other chronic diseases classified elsewhere: Secondary | ICD-10-CM | POA: Diagnosis not present

## 2019-03-23 LAB — IRON AND TIBC
Iron: 78 ug/dL (ref 45–182)
Saturation Ratios: 57 % — ABNORMAL HIGH (ref 17.9–39.5)
TIBC: 137 ug/dL — ABNORMAL LOW (ref 250–450)
UIBC: 59 ug/dL

## 2019-03-23 LAB — FERRITIN: Ferritin: 565 ng/mL — ABNORMAL HIGH (ref 24–336)

## 2019-03-23 LAB — POCT HEMOGLOBIN-HEMACUE: Hemoglobin: 9.4 g/dL — ABNORMAL LOW (ref 13.0–17.0)

## 2019-03-23 MED ORDER — DARBEPOETIN ALFA 300 MCG/0.6ML IJ SOSY
225.0000 ug | PREFILLED_SYRINGE | INTRAMUSCULAR | Status: DC
  Administered 2019-03-23: 225 ug via SUBCUTANEOUS

## 2019-03-23 MED ORDER — DARBEPOETIN ALFA 25 MCG/0.42ML IJ SOSY
PREFILLED_SYRINGE | INTRAMUSCULAR | Status: AC
  Filled 2019-03-23: qty 0.42

## 2019-03-23 MED ORDER — DARBEPOETIN ALFA 200 MCG/0.4ML IJ SOSY
PREFILLED_SYRINGE | INTRAMUSCULAR | Status: AC
  Filled 2019-03-23: qty 0.4

## 2019-03-24 MED FILL — Darbepoetin Alfa Soln Prefilled Syringe 25 MCG/0.42ML: INTRAMUSCULAR | Qty: 0.42 | Status: AC

## 2019-03-24 MED FILL — Darbepoetin Alfa Soln Prefilled Syringe 200 MCG/0.4ML: INTRAMUSCULAR | Qty: 0.4 | Status: AC

## 2019-04-20 ENCOUNTER — Other Ambulatory Visit: Payer: Self-pay

## 2019-04-20 ENCOUNTER — Ambulatory Visit (HOSPITAL_COMMUNITY)
Admission: RE | Admit: 2019-04-20 | Discharge: 2019-04-20 | Disposition: A | Discharge status: Home / Self Care | Payer: Managed Care, Other (non HMO) | Source: Ambulatory Visit | Attending: Nephrology | Admitting: Nephrology

## 2019-04-20 VITALS — BP 125/71 | HR 72 | Temp 97.3°F | Resp 18 | Ht 72.0 in | Wt 183.0 lb

## 2019-04-20 DIAGNOSIS — D638 Anemia in other chronic diseases classified elsewhere: Secondary | ICD-10-CM | POA: Insufficient documentation

## 2019-04-20 DIAGNOSIS — N185 Chronic kidney disease, stage 5: Secondary | ICD-10-CM | POA: Diagnosis not present

## 2019-04-20 LAB — POCT HEMOGLOBIN-HEMACUE
Hemoglobin: 10.5 g/dL — ABNORMAL LOW (ref 13.0–17.0)
Hemoglobin: 13.9 g/dL (ref 13.0–17.0)

## 2019-04-20 LAB — IRON AND TIBC
Iron: 39 ug/dL — ABNORMAL LOW (ref 45–182)
Saturation Ratios: 32 % (ref 17.9–39.5)
TIBC: 120 ug/dL — ABNORMAL LOW (ref 250–450)
UIBC: 81 ug/dL

## 2019-04-20 LAB — FERRITIN: Ferritin: 832 ng/mL — ABNORMAL HIGH (ref 24–336)

## 2019-04-20 MED ORDER — DARBEPOETIN ALFA 200 MCG/0.4ML IJ SOSY
PREFILLED_SYRINGE | INTRAMUSCULAR | Status: AC
  Filled 2019-04-20: qty 0.4

## 2019-04-20 MED ORDER — DARBEPOETIN ALFA 300 MCG/0.6ML IJ SOSY
225.0000 ug | PREFILLED_SYRINGE | INTRAMUSCULAR | Status: DC
  Administered 2019-04-20: 10:00:00 225 ug via SUBCUTANEOUS

## 2019-04-20 MED ORDER — DARBEPOETIN ALFA 25 MCG/0.42ML IJ SOSY
PREFILLED_SYRINGE | INTRAMUSCULAR | Status: AC
  Filled 2019-04-20: qty 0.42

## 2019-04-21 MED FILL — Darbepoetin Alfa Soln Prefilled Syringe 200 MCG/0.4ML: INTRAMUSCULAR | Qty: 0.4 | Status: AC

## 2019-04-21 MED FILL — Darbepoetin Alfa Soln Prefilled Syringe 25 MCG/0.42ML: INTRAMUSCULAR | Qty: 0.42 | Status: AC

## 2019-04-23 ENCOUNTER — Other Ambulatory Visit: Payer: Self-pay

## 2019-04-23 ENCOUNTER — Encounter (HOSPITAL_COMMUNITY): Payer: Self-pay

## 2019-04-23 ENCOUNTER — Inpatient Hospital Stay (HOSPITAL_COMMUNITY)
Admission: EM | Admit: 2019-04-23 | Discharge: 2019-04-29 | DRG: 637 | Disposition: A | Discharge status: Home / Self Care | Payer: Managed Care, Other (non HMO) | Attending: Internal Medicine | Admitting: Internal Medicine

## 2019-04-23 ENCOUNTER — Emergency Department (HOSPITAL_COMMUNITY): Payer: Managed Care, Other (non HMO)

## 2019-04-23 DIAGNOSIS — I10 Essential (primary) hypertension: Secondary | ICD-10-CM | POA: Diagnosis not present

## 2019-04-23 DIAGNOSIS — E1165 Type 2 diabetes mellitus with hyperglycemia: Secondary | ICD-10-CM | POA: Diagnosis not present

## 2019-04-23 DIAGNOSIS — D6959 Other secondary thrombocytopenia: Secondary | ICD-10-CM | POA: Diagnosis present

## 2019-04-23 DIAGNOSIS — R188 Other ascites: Secondary | ICD-10-CM

## 2019-04-23 DIAGNOSIS — M25562 Pain in left knee: Secondary | ICD-10-CM | POA: Diagnosis not present

## 2019-04-23 DIAGNOSIS — Z6824 Body mass index (BMI) 24.0-24.9, adult: Secondary | ICD-10-CM

## 2019-04-23 DIAGNOSIS — R112 Nausea with vomiting, unspecified: Secondary | ICD-10-CM | POA: Diagnosis not present

## 2019-04-23 DIAGNOSIS — I482 Chronic atrial fibrillation, unspecified: Secondary | ICD-10-CM | POA: Diagnosis present

## 2019-04-23 DIAGNOSIS — Z79899 Other long term (current) drug therapy: Secondary | ICD-10-CM

## 2019-04-23 DIAGNOSIS — I129 Hypertensive chronic kidney disease with stage 1 through stage 4 chronic kidney disease, or unspecified chronic kidney disease: Secondary | ICD-10-CM | POA: Diagnosis present

## 2019-04-23 DIAGNOSIS — E119 Type 2 diabetes mellitus without complications: Secondary | ICD-10-CM

## 2019-04-23 DIAGNOSIS — Y830 Surgical operation with transplant of whole organ as the cause of abnormal reaction of the patient, or of later complication, without mention of misadventure at the time of the procedure: Secondary | ICD-10-CM | POA: Diagnosis present

## 2019-04-23 DIAGNOSIS — Z8249 Family history of ischemic heart disease and other diseases of the circulatory system: Secondary | ICD-10-CM

## 2019-04-23 DIAGNOSIS — Z823 Family history of stroke: Secondary | ICD-10-CM

## 2019-04-23 DIAGNOSIS — K219 Gastro-esophageal reflux disease without esophagitis: Secondary | ICD-10-CM | POA: Diagnosis present

## 2019-04-23 DIAGNOSIS — Z794 Long term (current) use of insulin: Secondary | ICD-10-CM

## 2019-04-23 DIAGNOSIS — Z89512 Acquired absence of left leg below knee: Secondary | ICD-10-CM

## 2019-04-23 DIAGNOSIS — R509 Fever, unspecified: Secondary | ICD-10-CM | POA: Diagnosis present

## 2019-04-23 DIAGNOSIS — E1140 Type 2 diabetes mellitus with diabetic neuropathy, unspecified: Secondary | ICD-10-CM | POA: Diagnosis present

## 2019-04-23 DIAGNOSIS — Z20828 Contact with and (suspected) exposure to other viral communicable diseases: Secondary | ICD-10-CM | POA: Diagnosis present

## 2019-04-23 DIAGNOSIS — Z89511 Acquired absence of right leg below knee: Secondary | ICD-10-CM

## 2019-04-23 DIAGNOSIS — N179 Acute kidney failure, unspecified: Secondary | ICD-10-CM

## 2019-04-23 DIAGNOSIS — R52 Pain, unspecified: Secondary | ICD-10-CM | POA: Diagnosis not present

## 2019-04-23 DIAGNOSIS — R739 Hyperglycemia, unspecified: Secondary | ICD-10-CM

## 2019-04-23 DIAGNOSIS — Z7682 Awaiting organ transplant status: Secondary | ICD-10-CM

## 2019-04-23 DIAGNOSIS — E785 Hyperlipidemia, unspecified: Secondary | ICD-10-CM | POA: Diagnosis present

## 2019-04-23 DIAGNOSIS — Z7982 Long term (current) use of aspirin: Secondary | ICD-10-CM

## 2019-04-23 DIAGNOSIS — E7800 Pure hypercholesterolemia, unspecified: Secondary | ICD-10-CM | POA: Diagnosis present

## 2019-04-23 DIAGNOSIS — R269 Unspecified abnormalities of gait and mobility: Secondary | ICD-10-CM

## 2019-04-23 DIAGNOSIS — Z85068 Personal history of other malignant neoplasm of small intestine: Secondary | ICD-10-CM

## 2019-04-23 DIAGNOSIS — I4891 Unspecified atrial fibrillation: Secondary | ICD-10-CM | POA: Diagnosis present

## 2019-04-23 DIAGNOSIS — N184 Chronic kidney disease, stage 4 (severe): Secondary | ICD-10-CM | POA: Diagnosis present

## 2019-04-23 DIAGNOSIS — E876 Hypokalemia: Secondary | ICD-10-CM | POA: Diagnosis present

## 2019-04-23 DIAGNOSIS — E43 Unspecified severe protein-calorie malnutrition: Secondary | ICD-10-CM | POA: Diagnosis present

## 2019-04-23 DIAGNOSIS — T8619 Other complication of kidney transplant: Secondary | ICD-10-CM | POA: Diagnosis present

## 2019-04-23 DIAGNOSIS — Z940 Kidney transplant status: Secondary | ICD-10-CM

## 2019-04-23 DIAGNOSIS — Z7901 Long term (current) use of anticoagulants: Secondary | ICD-10-CM

## 2019-04-23 DIAGNOSIS — E1122 Type 2 diabetes mellitus with diabetic chronic kidney disease: Secondary | ICD-10-CM

## 2019-04-23 DIAGNOSIS — D631 Anemia in chronic kidney disease: Secondary | ICD-10-CM | POA: Diagnosis present

## 2019-04-23 DIAGNOSIS — R531 Weakness: Secondary | ICD-10-CM

## 2019-04-23 LAB — CBC WITH DIFFERENTIAL/PLATELET
Abs Immature Granulocytes: 0.15 10*3/uL — ABNORMAL HIGH (ref 0.00–0.07)
Basophils Absolute: 0 10*3/uL (ref 0.0–0.1)
Basophils Relative: 0 %
Eosinophils Absolute: 0 10*3/uL (ref 0.0–0.5)
Eosinophils Relative: 0 %
HCT: 27.1 % — ABNORMAL LOW (ref 39.0–52.0)
Hemoglobin: 8.4 g/dL — ABNORMAL LOW (ref 13.0–17.0)
Immature Granulocytes: 1 %
Lymphocytes Relative: 4 %
Lymphs Abs: 0.8 10*3/uL (ref 0.7–4.0)
MCH: 28.8 pg (ref 26.0–34.0)
MCHC: 31 g/dL (ref 30.0–36.0)
MCV: 92.8 fL (ref 80.0–100.0)
Monocytes Absolute: 0.8 10*3/uL (ref 0.1–1.0)
Monocytes Relative: 4 %
Neutro Abs: 19.7 10*3/uL — ABNORMAL HIGH (ref 1.7–7.7)
Neutrophils Relative %: 91 %
Platelets: 112 10*3/uL — ABNORMAL LOW (ref 150–400)
RBC: 2.92 MIL/uL — ABNORMAL LOW (ref 4.22–5.81)
RDW: 14.2 % (ref 11.5–15.5)
WBC: 21.5 10*3/uL — ABNORMAL HIGH (ref 4.0–10.5)
nRBC: 0 % (ref 0.0–0.2)

## 2019-04-23 LAB — COMPREHENSIVE METABOLIC PANEL
ALT: 12 U/L (ref 0–44)
AST: 24 U/L (ref 15–41)
Albumin: 1.9 g/dL — ABNORMAL LOW (ref 3.5–5.0)
Alkaline Phosphatase: 78 U/L (ref 38–126)
Anion gap: 12 (ref 5–15)
BUN: 49 mg/dL — ABNORMAL HIGH (ref 8–23)
CO2: 25 mmol/L (ref 22–32)
Calcium: 8.1 mg/dL — ABNORMAL LOW (ref 8.9–10.3)
Chloride: 100 mmol/L (ref 98–111)
Creatinine, Ser: 4.25 mg/dL — ABNORMAL HIGH (ref 0.61–1.24)
GFR calc Af Amer: 16 mL/min — ABNORMAL LOW (ref >60)
GFR calc non Af Amer: 14 mL/min — ABNORMAL LOW (ref >60)
Glucose, Bld: 390 mg/dL — ABNORMAL HIGH (ref 70–99)
Potassium: 3.8 mmol/L (ref 3.5–5.1)
Sodium: 137 mmol/L (ref 135–145)
Total Bilirubin: 0.9 mg/dL (ref 0.3–1.2)
Total Protein: 3.9 g/dL — ABNORMAL LOW (ref 6.5–8.1)

## 2019-04-23 LAB — POC SARS CORONAVIRUS 2 AG -  ED: SARS Coronavirus 2 Ag: NEGATIVE

## 2019-04-23 LAB — LIPASE, BLOOD: Lipase: 14 U/L (ref 11–51)

## 2019-04-23 MED ORDER — VANCOMYCIN HCL IN DEXTROSE 1-5 GM/200ML-% IV SOLN: 1000.0000 mg | Freq: Once | INTRAVENOUS | Status: DC

## 2019-04-23 MED ORDER — VANCOMYCIN HCL 10 G IV SOLR
1250.0000 mg | INTRAVENOUS | Status: DC
  Administered 2019-04-25 – 2019-04-27 (×2): 1250 mg via INTRAVENOUS
  Filled 2019-04-23 (×2): qty 1250

## 2019-04-23 MED ORDER — SODIUM CHLORIDE 0.9 % IV BOLUS
1000.0000 mL | Freq: Once | INTRAVENOUS | Status: AC
  Administered 2019-04-23: 1000 mL via INTRAVENOUS

## 2019-04-23 MED ORDER — SODIUM CHLORIDE 0.9 % IV SOLN
2.0000 g | Freq: Once | INTRAVENOUS | Status: AC
  Administered 2019-04-24: 2 g via INTRAVENOUS
  Filled 2019-04-23: qty 2

## 2019-04-23 MED ORDER — ONDANSETRON HCL 4 MG/2ML IJ SOLN
4.0000 mg | Freq: Once | INTRAMUSCULAR | Status: AC
  Administered 2019-04-23: 4 mg via INTRAVENOUS
  Filled 2019-04-23: qty 2

## 2019-04-23 MED ORDER — VANCOMYCIN HCL 10 G IV SOLR
1500.0000 mg | Freq: Once | INTRAVENOUS | Status: AC
  Administered 2019-04-24: 1500 mg via INTRAVENOUS
  Filled 2019-04-23 (×2): qty 1500

## 2019-04-23 MED ORDER — SODIUM CHLORIDE 0.9 % IV SOLN
2.0000 g | INTRAVENOUS | Status: DC
  Administered 2019-04-24 – 2019-04-27 (×4): 2 g via INTRAVENOUS
  Filled 2019-04-23 (×4): qty 2

## 2019-04-23 MED ORDER — METRONIDAZOLE IN NACL 5-0.79 MG/ML-% IV SOLN
500.0000 mg | Freq: Once | INTRAVENOUS | Status: AC
  Administered 2019-04-24: 500 mg via INTRAVENOUS
  Filled 2019-04-23: qty 100

## 2019-04-23 NOTE — ED Triage Notes (Signed)
Pt arrived via [facility] from home for c/o generalized weakness, pt states vomited Mon and had dry heaves today. Pt states temp 100.29F axillary. Pt took glucose at home and it was 441 and took basaglar 10 units before coming. Pt states gained 16lbs in 3wks. Pt is A&Ox4. NSR on monitor.

## 2019-04-23 NOTE — Progress Notes (Signed)
Pharmacy Antibiotic Note  Paul [name] is a 63 y.o. male admitted on 04/23/2019 with sepsis.  Pharmacy has been consulted for vancomycin and cefepime dosing. Pt is afebrile but WBC is elevated at 21.5. SCr is also elevated at 4.25. Known history of CKD.   Plan: Vancomycin 1500mg  IV x 1 then 1250mg  IV Q48H Cefepime 2gm IV Q24H F/u renal fxn, C&S, clinical status and peak/trough at SS  Height: 6' (182.9 cm) Weight: 183 lb (83 kg) IBW/kg (Calculated) : 77.6  Temp (24hrs), Avg:98.4 F (36.9 C), Min:98.4 F (36.9 C), Max:98.4 F (36.9 C)  Recent Labs  Lab 04/23/19 1808  WBC 21.5*  CREATININE 4.25*    Estimated Creatinine Clearance: 19 mL/min (A) (by C-G formula based on SCr of 4.25 mg/dL (H)).    Allergies  Allergen Reactions  . Metformin And Related Other (See Comments)    Has kidney problems    Antimicrobials this admission: Vanc 12/4>> Cefepime 12/4>> Flagyl x 1 12/4  Dose adjustments this admission: N/A  Microbiology results: Pending  Thank you for allowing pharmacy to be a part of this patient's care.  Raife Lizer, Rande Lawman 04/23/2019 11:03 PM

## 2019-04-23 NOTE — ED Provider Notes (Signed)
  Face-to-face evaluation   History: He presents for evaluation of not feeling well, with fever, and generalized swelling.  He stopped taking his medications for blood sugar, 5 days ago because "I was not feeling good."  Physical exam: Ill-appearing man, who is generally edematous.  Abdomen soft nontender.  Chest is nontender.  No respiratory distress.  Medical screening examination/treatment/procedure(s) were conducted as a shared visit with non-physician practitioner(s) and myself.  I personally evaluated the patient during the encounter    Daleen Bo, MD 04/24/19 1010

## 2019-04-23 NOTE — ED Notes (Signed)
Wife- [name] would like an update at [phone]

## 2019-04-23 NOTE — ED Provider Notes (Signed)
[facility] EMERGENCY DEPARTMENT Provider Note   CSN: [id number] Arrival date & time: 04/23/19  1715     History   Chief Complaint Chief Complaint  Patient presents with   Fever    HPI Paul [name] is a 63 y.o. male with past medical history of duodenal adenocarcinoma status post resection, ESRD status post right renal transplant in 2016, controlled A. fib on aspirin, status post bilateral BKA, presenting to the emergency department with complaint of fever that began today.  T-max of 100.3 F at home.  He did not treat his fever with anything prior to coming to the ED.  He called his PCP who recommended he report to the ED for further evaluation.  He states he has had generalized weakness and fatigue over the last few days.  Today he had some dry heaving and nausea.  He states he has not taken his basal dose or sliding scale insulin 2 days prior to today because he has been not feeling well.  He states today however he checked his blood sugar and noted it to be over 400 which she treated with his 10 units of basal dose insulin.  On EMS arrival he was noted to still have blood glucose level greater than 400.  Denies associated cough, abdominal pain, urinary symptoms.  He does currently make urine, however he states his right kidney is only functioning at about 20% and he is expecting needing to restart hemodialysis and go back on the transplant list.  No known Covid contacts.    The history is provided by the patient.    Past Medical History:  Diagnosis Date   A-fib [facility])    Anemia    esrd   Calciphylaxis 05/2013   lower ext   Cancer [facility])    hx duodenal adenoCa 2002, resected   Closed left arm fracture 1967   Corneal abrasion, left    Depression    Diabetes mellitus    Type 1 iddm x 11 yrs   Diabetic neuropathy ([place])    ESRD on hemodialysis ([place])    Pt has ESRD due to DM.  He had a L upper arm AVF prior to starting HD which was ligated due to L arm  steal syndrome.  He started HD in Jan 2014 and did home HD.  The family couldn't cannulate the R arm AVF successfully so by mid 2014 they decided to switch to PD which was done late summer 2014.  In Jan 2015 he was admitted with FTT felt to be due to underdialysis on PD and PD was abandoned and he    GERD (gastroesophageal reflux disease)    Glomerulosclerosis, diabetic ([place])    Heart murmur    History of blood transfusion    Hypertension    off of meds due to orthostatic hypotension   Kidney transplant recipient    Open wound of both legs with complication    Pt has had progressive wounds of both LE's including gangrene of the toes and patchy necrosis of the calves.  He has been treated at [facility] by Dr Jerline Pain with hyperbaric O2 5d per week.  He is getting Na Thiosulfate with HD for suspected calciphylaxis. Pt says doctor's aren't sure if these ulcers were diabetic ulcers or calciphylaxis.  He underwent bilat BKA on 07/30/13.  He says that the woun    Patient Active Problem List   Diagnosis Date Noted   Acute metabolic encephalopathy 17/00/1749  Stroke ([place]) 10/28/2018   Middle cerebral artery embolism, left 27/25/3664   Metabolic acidosis 40/34/7425   Dehydration 05/24/2018   Pressure injury of skin 05/24/2018   Dyspnea 05/23/2018   Acute respiratory failure with hypoxia ([place]) 05/23/2018   Abnormal cardiovascular stress test 12/29/2013   Awaiting organ transplant 12/09/2013   Leg ulcer ([place]) 11/17/2013   Chronic kidney disease (CKD), stage V ([place]) 11/17/2013   Diabetes ([place]) 11/17/2013   Cancer of duodenum ([place]) 11/17/2013   Dyslipidemia 11/17/2013   Chronic kidney disease requiring chronic dialysis ([place]) 11/17/2013   Hypercholesteremia 11/17/2013   H/O malignant neoplasm 11/17/2013   H/O: HTN (hypertension) 11/17/2013   BP (high blood pressure) 11/17/2013   Hypotension 11/17/2013   Angiopathy, peripheral ([place]) 11/17/2013   AF (paroxysmal  atrial fibrillation) ([place]) 11/17/2013   BKA stump complication ([place]) 95/63/8756   S/P bilateral BKA (below knee amputation) ([place]) 08/03/2013   S/P BKA (below knee amputation) bilateral ([place]) 07/30/2013   Open wound of both legs with complication 43/32/9518   Acute blood loss anemia 07/12/2013   Syncope 07/09/2013   Intolerance to CAPD peritoneal dialysis s/p CAPD cath removal 07/09/2013 07/05/2013   Critical lower limb ischemia 06/30/2013   Extremity pain 06/30/2013   Atherosclerosis of native arteries of the extremities with ulceration(440.23) 06/17/2013   Gait disorder 05/31/2013   Weakness 05/24/2013   Depression 04/03/2013   GERD (gastroesophageal reflux disease) 04/03/2013   Atrial fibrillation ([place]) 04/02/2013   DM (diabetes mellitus) ([place]) 84/16/6063   Other complications due to renal dialysis device, implant, and graft 05/19/2012   Diabetes mellitus, type 2 ([place]) 11/13/2011   Anemia 07/02/2011   ESRD on hemodialysis ([place]) 05/29/2011    Past Surgical History:  Procedure Laterality Date   AMPUTATION Bilateral 07/30/2013   Procedure: AMPUTATION BELOW KNEE;  Surgeon: Newt Minion, MD;  Location: [facility];  Service: Orthopedics;  Laterality: Bilateral;  Bilateral Below Knee Amputations   AMPUTATION Bilateral 09/03/2013   Procedure: AMPUTATION BELOW KNEE;  Surgeon: Newt Minion, MD;  Location: [facility];  Service: Orthopedics;  Laterality: Bilateral;  Bilateral Below Knee Amputation Revision   AV FISTULA PLACEMENT  06/07/2011   Procedure: ARTERIOVENOUS (AV) FISTULA CREATION;  Surgeon: Angelia Mould, MD;  Location: [facility];  Service: Vascular;  Laterality: Left;   AV FISTULA PLACEMENT  06/18/2012   Procedure: ARTERIOVENOUS (AV) FISTULA CREATION;  Surgeon: Mal Misty, MD;  Location: [facility];  Service: Vascular;  Laterality: Right;  [place], EXTERNAL  2002   BONE MARROW BIOPSY  2012   CAPD INSERTION N/A 01/14/2013   Procedure: LAPAROSCOPIC  INSERTION CONTINUOUS AMBULATORY PERITONEAL DIALYSIS  (CAPD) CATHETER;  Surgeon: Adin Hector, MD;  Location: [facility];  Service: General;  Laterality: N/A;   CAPD REMOVAL N/A 07/09/2013   Procedure: CONTINUOUS AMBULATORY PERITONEAL DIALYSIS  (CAPD) CATHETER REMOVAL;  Surgeon: Adin Hector, MD;  Location: [facility];  Service: General;  Laterality: N/A;   COLONOSCOPY     Hx: of   INSERTION OF DIALYSIS CATHETER  05/22/2012   Procedure: INSERTION OF DIALYSIS CATHETER;  Surgeon: Mal Misty, MD;  Location: [facility] OR;  Service: Vascular;  Laterality: N/A;  right internal jugular vein   IR ANGIO INTRA EXTRACRAN SEL COM CAROTID INNOMINATE BILAT MOD SED  10/28/2018   IR ANGIO VERTEBRAL SEL SUBCLAVIAN INNOMINATE UNI R MOD SED  10/28/2018   IR ANGIO VERTEBRAL SEL VERTEBRAL UNI L MOD SED  10/28/2018   LIGATION OF ARTERIOVENOUS  FISTULA  05/22/2012   Procedure: LIGATION OF ARTERIOVENOUS  FISTULA;  Surgeon: Mal Misty, MD;  Location: [facility];  Service: Vascular;  Laterality: Left;  Left brachio-cephalic fistula   LOWER EXTREMITY ANGIOGRAM N/A 06/14/2013   Procedure: LOWER EXTREMITY ANGIOGRAM;  Surgeon: Angelia Mould, MD;  Location: [facility] CATH LAB;  Service: Cardiovascular;  Laterality: N/A;   NEPHRECTOMY TRANSPLANTED ORGAN     OTHER SURGICAL HISTORY     Cyst removed from back   [place] WITH ANESTHESIA N/A 10/28/2018   Procedure: IR WITH ANESTHESIA;  Surgeon: Radiologist, Medication, MD;  Location: [facility];  Service: Radiology;  Laterality: N/A;   REMOVAL OF A DIALYSIS CATHETER     RENAL BIOPSY, PERCUTANEOUS  2012   TONSILLECTOMY     VASCULAR SURGERY     WHIPPLE PROCEDURE  2002   duodenal ca, Dr. Harlow Asa        Home Medications    Prior to Admission medications   Medication Sig Start Date End Date Taking? Authorizing Provider  amLODipine (NORVASC) 10 MG tablet Take 10 mg by mouth daily.    [provider]  apixaban (ELIQUIS) 5 MG TABS tablet Take 1  tablet (5 mg total) by mouth 2 (two) times daily. 02/26/19   O'NealCassie Freer, MD  aspirin EC 81 MG tablet Take 81 mg by mouth daily.    [provider]  atorvastatin (LIPITOR) 10 MG tablet Take 1 tablet (10 mg total) by mouth daily. 08/13/13   Angiulli, Lavon Paganini, PA-C  calcitRIOL (ROCALTROL) 0.25 MCG capsule Take 0.5 mcg by mouth daily.    [provider]  calcium acetate (PHOSLO) 667 MG capsule Take 1,334 mg by mouth 3 (three) times daily with meals.    [provider]  CVS D3 50 MCG (2000 UT) CAPS Take 4,000 Units by mouth daily. 09/18/18   [provider]  HUMALOG KWIKPEN 100 UNIT/ML KwikPen Sliding scale 01/11/19   [provider]  hydrALAZINE (APRESOLINE) 50 MG tablet Take 1 tablet by mouth 2 (two) times daily. 02/22/19   [provider]  Insulin Glargine (BASAGLAR KWIKPEN) 100 UNIT/ML SOPN Inject 10 Units into the skin daily.  01/11/19   [provider]  magnesium chloride (SLOW-MAG) 64 MG TBEC SR tablet Take 2 tablets by mouth 2 (two) times daily. TAKE AT LEAST 2 HOURS SEPARATE FROM PROGRAF/TACROLIMUS 10/02/14   [provider]  mycophenolate (MYFORTIC) 180 MG EC tablet Take 360 mg by mouth 2 (two) times daily.     [provider]  omeprazole (PRILOSEC) 40 MG capsule Take 40 mg by mouth daily.  01/11/14   [provider]  predniSONE (DELTASONE) 5 MG tablet Take 5 mg by mouth daily with breakfast.     [provider]  sodium bicarbonate 650 MG tablet Take 1,950 mg by mouth 4 (four) times daily. 09/21/18   [provider]  tacrolimus (PROGRAF) 1 MG capsule Take 3-4 mg by mouth See admin instructions. Take 4 mg by mouth in the morning at 9 AM and 3 mg in the evening at 9 PM    [provider]  tamsulosin (FLOMAX) 0.4 MG CAPS capsule Take 0.4 mg by mouth daily after breakfast.     [provider]    Family History Family History  Problem Relation Age of Onset   Cancer  Mother    Cancer Father        prostate cancer   Heart disease Maternal Grandfather  Stroke Paternal Grandmother     Social History Social History   Tobacco Use   Smoking status: Never Smoker   Smokeless tobacco: Never Used  Substance Use Topics   Alcohol use: No   Drug use: No     Allergies   Metformin and related   Review of Systems Review of Systems  Constitutional: Positive for fatigue and fever.  Respiratory: Negative for cough.   Gastrointestinal: Positive for nausea. Negative for abdominal pain.  All other systems reviewed and are negative.    Physical Exam Updated Vital Signs BP 110/62    Pulse (!) 59    Temp 98.4 F (36.9 C) (Oral)    Resp 17    Ht 6' (1.829 m)    Wt 83 kg    SpO2 97%    BMI 24.82 kg/m   Physical Exam Vitals signs and nursing note reviewed.  Constitutional:      General: He is not in acute distress.    Appearance: He is well-developed. He is ill-appearing.  HENT:     Head: Normocephalic and atraumatic.  Eyes:     Conjunctiva/sclera: Conjunctivae normal.  Cardiovascular:     Rate and Rhythm: Normal rate and regular rhythm.  Pulmonary:     Effort: Pulmonary effort is normal. No respiratory distress.     Breath sounds: Normal breath sounds.  Abdominal:     General: Bowel sounds are normal.     Palpations: Abdomen is soft.     Tenderness: There is abdominal tenderness (Patient reports slight right-sided abdominal tenderness with palpation.). There is no guarding or rebound.  Musculoskeletal:     Comments: Bilateral BKA  Skin:    General: Skin is warm.     Coloration: Skin is pale.  Neurological:     Mental Status: He is alert.  Psychiatric:        Behavior: Behavior normal.      ED Treatments / Results  Labs (all labs ordered are listed, but only abnormal results are displayed) Labs Reviewed  CBC WITH DIFFERENTIAL/PLATELET - Abnormal; Notable for the following components:      Result Value   WBC 21.5 (*)    RBC  2.92 (*)    Hemoglobin 8.4 (*)    HCT 27.1 (*)    Platelets 112 (*)    Neutro Abs 19.7 (*)    Abs Immature Granulocytes 0.15 (*)    All other components within normal limits  COMPREHENSIVE METABOLIC PANEL - Abnormal; Notable for the following components:   Glucose, Bld 390 (*)    BUN 49 (*)    Creatinine, Ser 4.25 (*)    Calcium 8.1 (*)    Total Protein 3.9 (*)    Albumin 1.9 (*)    GFR calc non Af Amer 14 (*)    GFR calc Af Amer 16 (*)    All other components within normal limits  SARS CORONAVIRUS 2 (TAT 6-24 HRS)  LIPASE, BLOOD  URINALYSIS, ROUTINE W REFLEX MICROSCOPIC  POC SARS CORONAVIRUS 2 AG -  ED  CBG MONITORING, ED    EKG None  Radiology Ct Abdomen Pelvis Wo Contrast  Result Date: 04/23/2019 CLINICAL DATA:  Acute generalized abdominal pain, history end-stage renal disease on dialysis, type I diabetes mellitus, GERD, hypertension, atrial fibrillation EXAM: CT ABDOMEN AND PELVIS WITHOUT CONTRAST TECHNIQUE: Multidetector CT imaging of the abdomen and pelvis was performed following the standard protocol without IV contrast. Sagittal and coronal MPR images reconstructed from axial data set. No oral  contrast. COMPARISON:  02/15/2019 FINDINGS: Lower chest: Small LEFT pleural effusion. Dependent bibasilar atelectasis. Minimal pericardial fluid. Hepatobiliary: Fatty infiltration of liver. Gallbladder surgically absent. Chronic pneumobilia. No definite hepatic mass. Pancreas: Atrophic Spleen: Normal appearance Adrenals/Urinary Tract: Adrenal glands normal appearance. Atrophic native kidneys containing calculi and renal vascular calcifications. Transplant kidney RIGHT iliac fossa. No evidence of renal mass or hydronephrosis. Bladder well distended. Stomach/Bowel: Fluid within stomach with questionable gastric wall thickening versus artifact from underdistention. Large and small bowel loops unremarkable. Vascular/Lymphatic: Extensive atherosclerotic calcifications of both large and small  vessels. Aorta normal caliber. Multiple pelvic phleboliths. No adenopathy. Reproductive: Mild prostatic enlargement gland 4.6 x 3.9 cm. Other: Significant ascites. Scattered soft tissue edema. No free air or hernia. Musculoskeletal: Osseous demineralization. IMPRESSION: Scattered ascites and soft tissue edema, nonspecific. Small LEFT pleural effusion with bibasilar atelectasis. Fatty infiltration of liver. Transplant kidney RIGHT iliac fossa without evidence of mass or hydronephrosis. Mild prostatic enlargement. Chronic pneumobilia suspect from prior biliary intervention. No other acute intra-abdominal or intrapelvic abnormalities. Aortic Atherosclerosis (ICD10-I70.0). Electronically Signed   By: Lavonia Dana M.D.   On: 04/23/2019 21:00   Dg Chest Port 1 View  Result Date: 04/23/2019 CLINICAL DATA:  Fever EXAM: PORTABLE CHEST 1 VIEW COMPARISON:  None. FINDINGS: The heart size and mediastinal contours are within normal limits. Both lungs are clear. The visualized skeletal structures are unremarkable. IMPRESSION: No active disease. Electronically Signed   By: Kerby Moors M.D.   On: 04/23/2019 20:15    Procedures Procedures (including critical care time)  Medications Ordered in ED Medications  sodium chloride 0.9 % bolus 1,000 mL (1,000 mLs Intravenous New Bag/Given 04/23/19 1929)  ondansetron (ZOFRAN) injection 4 mg (4 mg Intravenous Given 04/23/19 1928)     Initial Impression / Assessment and Plan / ED Course  I have reviewed the triage vital signs and the nursing notes.  Pertinent labs & imaging results that were available during my care of the patient were reviewed by me and considered in my medical decision making (see chart for details).    Paul [name] was evaluated in Emergency Department on 04/23/2019 for the symptoms described in the history of present illness. He was evaluated in the context of the global COVID-19 pandemic, which necessitated consideration that the patient might be at  risk for infection with the SARS-CoV-2 virus that causes COVID-19. Institutional protocols and algorithms that pertain to the evaluation of patients at risk for COVID-19 are in a state of rapid change based on information released by regulatory bodies including the CDC and federal and state organizations. These policies and algorithms were followed during the patient's care in the ED.    Patient presenting with 3 days of feeling unwell with nausea and dry heaving today as well as fever at home.  Has not been taking his insulin and is hyperglycemic on arrival.  He is ill-appearing.  Vital signs are stable, does not meet sepsis criteria.  Heart and lung sounds clear.  Abdomen is soft with some mild tenderness to the right abdomen however no peritoneal signs.  Labs obtained.  IV fluids and Zofran administered for hyperglycemia and nausea.  CBC with significant leukocytosis of 21.5.  Hemoglobin of 8.4 which appears to be around patient's baseline with chronic anemia.  Metabolic panel reveals creatinine of 4.25 which is up from 5 months ago when it was 3.68.  Hyperglycemic at 390 though normal bicarb and gap.  Lipase is within normal limits.  Obtained a chest x-ray and CT  scan for further evaluatio.  Chest x-ray is negative for infiltrate.  CT scan with new ascites, however no other acute findings.  Patient does have history of duodenal cancer status post resection, consider recurrent malignancy as cause of patient's ascites?  Exam is not consistent with SBP as patient has very mild tenderness on exam and no peritoneal signs.  POC Covid test is negative, will follow with further testing.  Patient discussed with and evaluated by Dr. Eulis Foster.  Given patient's presentation, AKI, ill appearance and unknown etiology of significant leukocytosis, recommend patient for admission.  Will consult hospitalist.  The patient appears reasonably stabilized for admission considering the current resources, flow, and capabilities  available in the ED at this time, and I doubt any other [facility] requiring further screening and/or treatment in the ED prior to admission.  Final Clinical Impressions(s) / ED Diagnoses   Final diagnoses:  AKI (acute kidney injury) ([place])  Non-intractable vomiting with nausea, unspecified vomiting type  Other ascites    ED Discharge Orders    None       Robinson, [place] N, PA-C 04/23/19 2301    Daleen Bo, MD 04/24/19 1010

## 2019-04-23 NOTE — H&P (Signed)
History and Physical    D'ARCY ABRAHA LNL:[id number] DOB: December 19, 1955 DOA: 04/23/2019  PCP: [name], MD Patient coming from: Home  Chief Complaint: Fever  HPI: GRAM SIEDLECKI is a 63 y.o. male with medical history significant of renal transplant, CKD stage IV, calciphylaxis, anemia of chronic disease, A. Fib, duodenal adenocarcinoma status post resection, insulin-dependent diabetes mellitus, diabetic neuropathy, status post bilateral BKA, hypertension presenting to the ED for evaluation of fever, nausea/vomiting, and hyperglycemia.  Patient states he has not been feeling well for the past few days.  Today he had a temperature of 100.3 F.  He did not take Tylenol or any other medications for the fever.  Reports generalized weakness, fatigue, and a few episodes of vomiting.  He has not been eating much.  Due to not feeling well for the past few days he has not been taking insulin or checking his blood sugars.  Denies cough or shortness of breath.  Denies abdominal pain, diarrhea, or dysuria.  Denies headaches or neck pain/stiffness.  States both of his hands have been swollen for the past 1 month.  He is not having any pain in his hands/arms.  He was recently seen by his nephrologist Dr. Lorrene Reid.  ED Course: Afebrile and hemodynamically stable.  WBC count 40.8 with neutrophilic predominance.  Hemoglobin 8.4, ranging between 9-13 since September 2020.  Previously baseline was in the 8-9 range.  Platelet count 112,000, mild thrombocytopenia seen on previous labs as well.  Blood glucose 390.  Bicarb 25, anion gap 12.  BUN 49, creatinine 4.2.  Creatinine ranging between 3.1-4.1 on labs done in June 2020.  Lipase and LFTs normal.  SARS-CoV-2 rapid antigen test negative; PCR test pending.  UA pending. Chest x-ray showing no active disease. CT abdomen pelvis without acute intra-abdominal pathology to explain nausea/vomiting and leukocytosis.. Patient received Zofran and 1 L normal saline bolus.  Review  of Systems:  All systems reviewed and apart from history of presenting illness, are negative.  Past Medical History:  Diagnosis Date  . A-fib ([place])   . Anemia    esrd  . Calciphylaxis 05/2013   lower ext  . Cancer ([place])    hx duodenal adenoCa 2002, resected  . Closed left arm fracture 1967  . Corneal abrasion, left   . Depression   . Diabetes mellitus    Type 1 iddm x 11 yrs  . Diabetic neuropathy ([place])   . ESRD on hemodialysis ([place])    Pt has ESRD due to DM.  He had a L upper arm AVF prior to starting HD which was ligated due to L arm steal syndrome.  He started HD in Jan 2014 and did home HD.  The family couldn't cannulate the R arm AVF successfully so by mid 2014 they decided to switch to PD which was done late summer 2014.  In Jan 2015 he was admitted with FTT felt to be due to underdialysis on PD and PD was abandoned and he   . GERD (gastroesophageal reflux disease)   . Glomerulosclerosis, diabetic ([place])   . Heart murmur   . History of blood transfusion   . Hypertension    off of meds due to orthostatic hypotension  . Kidney transplant recipient   . Open wound of both legs with complication    Pt has had progressive wounds of both LE's including gangrene of the toes and patchy necrosis of the calves.  He has been treated at [facility] by Dr Jerline Pain with  hyperbaric O2 5d per week.  He is getting Na Thiosulfate with HD for suspected calciphylaxis. Pt says doctor's aren't sure if these ulcers were diabetic ulcers or calciphylaxis.  He underwent bilat BKA on 07/30/13.  He says that the woun    Past Surgical History:  Procedure Laterality Date  . AMPUTATION Bilateral 07/30/2013   Procedure: AMPUTATION BELOW KNEE;  Surgeon: Newt Minion, MD;  Location: [place];  Service: Orthopedics;  Laterality: Bilateral;  Bilateral Below Knee Amputations  . AMPUTATION Bilateral 09/03/2013   Procedure: AMPUTATION BELOW KNEE;  Surgeon: Newt Minion, MD;  Location: [place];  Service: Orthopedics;   Laterality: Bilateral;  Bilateral Below Knee Amputation Revision  . AV FISTULA PLACEMENT  06/07/2011   Procedure: ARTERIOVENOUS (AV) FISTULA CREATION;  Surgeon: Angelia Mould, MD;  Location: [place];  Service: Vascular;  Laterality: Left;  . AV FISTULA PLACEMENT  06/18/2012   Procedure: ARTERIOVENOUS (AV) FISTULA CREATION;  Surgeon: Mal Misty, MD;  Location: [place];  Service: Vascular;  Laterality: Right;  [place]  . BILIARY DIVERSION, EXTERNAL  2002  . BONE MARROW BIOPSY  2012  . CAPD INSERTION N/A 01/14/2013   Procedure: LAPAROSCOPIC INSERTION CONTINUOUS AMBULATORY PERITONEAL DIALYSIS  (CAPD) CATHETER;  Surgeon: Adin Hector, MD;  Location: [place];  Service: General;  Laterality: N/A;  . CAPD REMOVAL N/A 07/09/2013   Procedure: CONTINUOUS AMBULATORY PERITONEAL DIALYSIS  (CAPD) CATHETER REMOVAL;  Surgeon: Adin Hector, MD;  Location: [place];  Service: General;  Laterality: N/A;  . COLONOSCOPY     Hx: of  . INSERTION OF DIALYSIS CATHETER  05/22/2012   Procedure: INSERTION OF DIALYSIS CATHETER;  Surgeon: Mal Misty, MD;  Location: [place];  Service: Vascular;  Laterality: N/A;  right internal jugular vein  . IR ANGIO INTRA EXTRACRAN SEL COM CAROTID INNOMINATE BILAT MOD SED  10/28/2018  . IR ANGIO VERTEBRAL SEL SUBCLAVIAN INNOMINATE UNI R MOD SED  10/28/2018  . IR ANGIO VERTEBRAL SEL VERTEBRAL UNI L MOD SED  10/28/2018  . LIGATION OF ARTERIOVENOUS  FISTULA  05/22/2012   Procedure: LIGATION OF ARTERIOVENOUS  FISTULA;  Surgeon: Mal Misty, MD;  Location: [place];  Service: Vascular;  Laterality: Left;  Left brachio-cephalic fistula  . LOWER EXTREMITY ANGIOGRAM N/A 06/14/2013   Procedure: LOWER EXTREMITY ANGIOGRAM;  Surgeon: Angelia Mould, MD;  Location: [facility] CATH LAB;  Service: Cardiovascular;  Laterality: N/A;  . NEPHRECTOMY TRANSPLANTED ORGAN    . OTHER SURGICAL HISTORY     Cyst removed from back  . PORTACATH PLACEMENT  2002  . RADIOLOGY WITH ANESTHESIA N/A 10/28/2018   Procedure:  IR WITH ANESTHESIA;  Surgeon: Radiologist, Medication, MD;  Location: [place];  Service: Radiology;  Laterality: N/A;  . REMOVAL OF A DIALYSIS CATHETER    . RENAL BIOPSY, PERCUTANEOUS  2012  . TONSILLECTOMY    . VASCULAR SURGERY    . WHIPPLE PROCEDURE  2002   duodenal ca, Dr. Harlow Asa     reports that he has never smoked. He has never used smokeless tobacco. He reports that he does not drink alcohol or use drugs.  Allergies  Allergen Reactions  . Metformin And Related Other (See Comments)    Has kidney problems    Family History  Problem Relation Age of Onset  . Cancer Mother   . Cancer Father        prostate cancer  . Heart disease Maternal Grandfather   . Stroke Paternal Grandmother  Prior to Admission medications   Medication Sig Start Date End Date Taking? Authorizing Provider  amLODipine (NORVASC) 10 MG tablet Take 10 mg by mouth daily.    [provider]  apixaban (ELIQUIS) 5 MG TABS tablet Take 1 tablet (5 mg total) by mouth 2 (two) times daily. 02/26/19   O'NealCassie Freer, MD  aspirin EC 81 MG tablet Take 81 mg by mouth daily.    [provider]  atorvastatin (LIPITOR) 10 MG tablet Take 1 tablet (10 mg total) by mouth daily. 08/13/13   Angiulli, Lavon Paganini, PA-C  calcitRIOL (ROCALTROL) 0.25 MCG capsule Take 0.5 mcg by mouth daily.    [provider]  calcium acetate (PHOSLO) 667 MG capsule Take 1,334 mg by mouth 3 (three) times daily with meals.    [provider]  CVS D3 50 MCG (2000 UT) CAPS Take 4,000 Units by mouth daily. 09/18/18   [provider]  HUMALOG KWIKPEN 100 UNIT/ML KwikPen Sliding scale 01/11/19   [provider]  hydrALAZINE (APRESOLINE) 50 MG tablet Take 1 tablet by mouth 2 (two) times daily. 02/22/19   [provider]  Insulin Glargine (BASAGLAR KWIKPEN) 100 UNIT/ML SOPN Inject 10 Units into the skin daily.  01/11/19   [provider]  magnesium chloride (SLOW-MAG) 64 MG TBEC SR tablet  Take 2 tablets by mouth 2 (two) times daily. TAKE AT LEAST 2 HOURS SEPARATE FROM PROGRAF/TACROLIMUS 10/02/14   [provider]  mycophenolate (MYFORTIC) 180 MG EC tablet Take 360 mg by mouth 2 (two) times daily.     [provider]  omeprazole (PRILOSEC) 40 MG capsule Take 40 mg by mouth daily.  01/11/14   [provider]  predniSONE (DELTASONE) 5 MG tablet Take 5 mg by mouth daily with breakfast.     [provider]  sodium bicarbonate 650 MG tablet Take 1,950 mg by mouth 4 (four) times daily. 09/21/18   [provider]  tacrolimus (PROGRAF) 1 MG capsule Take 3-4 mg by mouth See admin instructions. Take 4 mg by mouth in the morning at 9 AM and 3 mg in the evening at 9 PM    [provider]  tamsulosin (FLOMAX) 0.4 MG CAPS capsule Take 0.4 mg by mouth daily after breakfast.     [provider]    Physical Exam: Vitals:   04/24/19 0045 04/24/19 0100 04/24/19 0115 04/24/19 0130  BP:  127/66 (!) 146/65 (!) 141/64  Pulse: 63 60 (!) 59 (!) 57  Resp: 20 10 (!) 9 (!) 0  Temp:      TempSrc:      SpO2: 100% 100% 100% 97%  Weight:      Height:        Physical Exam  Constitutional: He is oriented to person, place, and time.  Appears ill  HENT:  Dry mucous membranes  Eyes: Right eye exhibits no discharge. Left eye exhibits no discharge.  Neck: Neck supple.  Cardiovascular: Normal rate, regular rhythm and intact distal pulses.  Pulmonary/Chest: Effort normal and breath sounds normal. No respiratory distress. He has no wheezes. He has no rales.  Abdominal: Soft. Bowel sounds are normal. He exhibits no distension. There is no abdominal tenderness. There is no guarding.  Musculoskeletal:        General: No edema.     Comments: Edema noted on the dorsum of the hands bilaterally, slightly worse on the left.  Neurological: He is alert and oriented to person, place, and time.  Skin: Skin is warm and dry. He is not diaphoretic.     Labs on  Admission: I have personally reviewed following labs and imaging studies  CBC: Recent Labs  Lab 04/20/19 0953 04/20/19 1025 04/23/19 1808  WBC  --   --  21.5*  NEUTROABS  --   --  19.7*  HGB 13.9 10.5* 8.4*  HCT  --   --  27.1*  MCV  --   --  92.8  PLT  --   --  453*   Basic Metabolic Panel: Recent Labs  Lab 04/23/19 1808  NA 137  K 3.8  CL 100  CO2 25  GLUCOSE 390*  BUN 49*  CREATININE 4.25*  CALCIUM 8.1*   GFR: Estimated Creatinine Clearance: 19 mL/min (A) (by C-G formula based on SCr of 4.25 mg/dL (H)). Liver Function Tests: Recent Labs  Lab 04/23/19 1808  AST 24  ALT 12  ALKPHOS 78  BILITOT 0.9  PROT 3.9*  ALBUMIN 1.9*   Recent Labs  Lab 04/23/19 1808  LIPASE 14   No results for input(s): AMMONIA in the last 168 hours. Coagulation Profile: No results for input(s): INR, PROTIME in the last 168 hours. Cardiac Enzymes: No results for input(s): CKTOTAL, CKMB, CKMBINDEX, TROPONINI in the last 168 hours. BNP (last 3 results) No results for input(s): PROBNP in the last 8760 hours. HbA1C: No results for input(s): HGBA1C in the last 72 hours. CBG: Recent Labs  Lab 04/24/19 0021  GLUCAP 335*   Lipid Profile: No results for input(s): CHOL, HDL, LDLCALC, TRIG, CHOLHDL, LDLDIRECT in the last 72 hours. Thyroid Function Tests: No results for input(s): TSH, T4TOTAL, FREET4, T3FREE, THYROIDAB in the last 72 hours. Anemia Panel: No results for input(s): VITAMINB12, FOLATE, FERRITIN, TIBC, IRON, RETICCTPCT in the last 72 hours. Urine analysis:    Component Value Date/Time   COLORURINE YELLOW 11/01/2018 1849   APPEARANCEUR CLEAR 11/01/2018 1849   LABSPEC 1.017 11/01/2018 1849   PHURINE 5.0 11/01/2018 1849   GLUCOSEU 150 (A) 11/01/2018 1849   HGBUR SMALL (A) 11/01/2018 1849   BILIRUBINUR NEGATIVE 11/01/2018 [place] 11/01/2018 1849   PROTEINUR 30 (A) 11/01/2018 1849   UROBILINOGEN 0.2 08/02/2014 1424   NITRITE NEGATIVE 11/01/2018 1849    LEUKOCYTESUR NEGATIVE 11/01/2018 1849    Radiological Exams on Admission: Ct Abdomen Pelvis Wo Contrast  Result Date: 04/23/2019 CLINICAL DATA:  Acute generalized abdominal pain, history end-stage renal disease on dialysis, type I diabetes mellitus, GERD, hypertension, atrial fibrillation EXAM: CT ABDOMEN AND PELVIS WITHOUT CONTRAST TECHNIQUE: Multidetector CT imaging of the abdomen and pelvis was performed following the standard protocol without IV contrast. Sagittal and coronal MPR images reconstructed from axial data set. No oral contrast. COMPARISON:  02/15/2019 FINDINGS: Lower chest: Small LEFT pleural effusion. Dependent bibasilar atelectasis. Minimal pericardial fluid. Hepatobiliary: Fatty infiltration of liver. Gallbladder surgically absent. Chronic pneumobilia. No definite hepatic mass. Pancreas: Atrophic Spleen: Normal appearance Adrenals/Urinary Tract: Adrenal glands normal appearance. Atrophic native kidneys containing calculi and renal vascular calcifications. Transplant kidney RIGHT iliac fossa. No evidence of renal mass or hydronephrosis. Bladder well distended. Stomach/Bowel: Fluid within stomach with questionable gastric wall thickening versus artifact from underdistention. Large and small bowel loops unremarkable. Vascular/Lymphatic: Extensive atherosclerotic calcifications of both large and small vessels. Aorta normal caliber. Multiple pelvic phleboliths. No adenopathy. Reproductive: Mild prostatic enlargement gland 4.6 x 3.9 cm. Other: Significant ascites. Scattered soft tissue edema. No free air or hernia. Musculoskeletal: Osseous demineralization. IMPRESSION: Scattered ascites and soft tissue  edema, nonspecific. Small LEFT pleural effusion with bibasilar atelectasis. Fatty infiltration of liver. Transplant kidney RIGHT iliac fossa without evidence of mass or hydronephrosis. Mild prostatic enlargement. Chronic pneumobilia suspect from prior biliary intervention. No other acute  intra-abdominal or intrapelvic abnormalities. Aortic Atherosclerosis (ICD10-I70.0). Electronically Signed   By: Lavonia Dana M.D.   On: 04/23/2019 21:00   Dg Chest Port 1 View  Result Date: 04/23/2019 CLINICAL DATA:  Fever EXAM: PORTABLE CHEST 1 VIEW COMPARISON:  None. FINDINGS: The heart size and mediastinal contours are within normal limits. Both lungs are clear. The visualized skeletal structures are unremarkable. IMPRESSION: No active disease. Electronically Signed   By: Kerby Moors M.D.   On: 04/23/2019 20:15    EKG: Independently reviewed.  Sinus rhythm.  No significant change since prior tracing.  Assessment/Plan Principal Problem:   Fever Active Problems:   Diabetes mellitus, type 2 (HCC)   AKI (acute kidney injury) ([place])   Hyperglycemia   Nausea and vomiting   Fever and leukocytosis - possible infection; unknown source Patient presenting with complaints of a fever of 100.3 F.  No antipyretics taken at home and on vitals done in the ED he is afebrile.  Does have significant leukocytosis with WBC count 96.2 with neutrophilic predominance.  Lactic acid normal.  Hemodynamically stable. Chest x-ray not suggestive of pneumonia.  CT abdomen pelvis without obvious infectious source - scattered ascites and soft tissue edema, non specific.  SBP less likely as no ascites or abdominal tenderness appreciated on exam. -Continue broad-spectrum antibiotics including vancomycin, cefepime, and metronidazole at this time. -UA pending -Blood culture x2 pending -Continue to monitor WBC count -SARS-CoV-2 test pending.  Continue airborne and contact precautions. -Check procalcitonin level  AKI on CKD stage IV, history of renal transplant BUN 49, creatinine 4.2.  Creatinine ranging between 3.1-4.1 on labs done in June 2020.  Most recent prior lab reading was creatinine 3.6 on 6/17.  Suspect prerenal due to decreased p.o. intake/vomiting. -IV fluid hydration -Continue monitor renal function  -Monitor urine output -Renal ultrasound -Consult nephrology in a.m.  Hyperglycemia, insulin-dependent diabetes mellitus Patient has not been able to take insulin for the past few days as he has been vomiting and not feeling well.  Blood glucose elevated at 390.  No signs of DKA.  Repeat blood glucose after 1 L IV fluid bolus in the ED is 335. -Check A1c.  Sliding scale insulin sensitive with bedtime coverage and CBG checks.  Nausea and vomiting Suspect related to uremia in setting of AKI on CKD stage IV.  Lipase and LFTs normal.  Abdominal exam benign.  Abdominal CT without obvious source. -IV fluid hydration -Zofran as needed  Edema on the dorsum of hands bilaterally Suspect due to chronic renal disease/hypoalbuminemia.  Albumin level 1.9.  No upper extremity pain or erythema to suggest DVT.  No signs of vascular compromise. -Management of renal disease as mentioned above  Anemia of chronic renal disease Hemoglobin 8.4, ranging between 9-13 since September 2020.  Previously baseline was in the 8-9 range.  No signs of active bleeding.  Ferritin high and TIBC low on iron studies done 12/1. -Continue to monitor  Mild thrombocytopenia Likely related to chronic renal disease/ hypoalbuminemia.  Platelet count 112,000, mild thrombocytopenia seen on previous labs as well.  No signs of active bleeding. -Continue to monitor   A. fib Currently in sinus rhythm.  On anticoagulation with Eliquis. -Pharmacy med rec pending  Severe protein calorie malnutrition Albumin 1.9. -Nutrition consult  Pharmacy med  rec pending.  DVT prophylaxis: Subcutaneous heparin Code Status: Patient wishes to be full code. Family Communication: No family available at this time. Disposition Plan: Anticipate discharge after clinical improvement. Consults called: None Admission status: It is my clinical opinion that admission to INPATIENT is reasonable and necessary in this 63 y.o. male . presenting with possible  infection from unknown source and AKI on CKD stage IV/ uremic symptoms.  Nephrology consultation needed in the morning.  Given the aforementioned, the predictability of an adverse outcome is felt to be significant. I expect that the patient will require at least 2 midnights in the hospital to treat this condition.   The medical decision making on this patient was of high complexity and the patient is at high risk for clinical deterioration, therefore this is a level 3 visit.  Shela Leff MD [facility] Pager [phone]  If 7PM-7AM, please contact night-coverage [email or web address] Password [email or web address]  04/24/2019, 2:24 AM

## 2019-04-24 ENCOUNTER — Observation Stay (HOSPITAL_COMMUNITY): Payer: Managed Care, Other (non HMO)

## 2019-04-24 DIAGNOSIS — Z85068 Personal history of other malignant neoplasm of small intestine: Secondary | ICD-10-CM | POA: Diagnosis not present

## 2019-04-24 DIAGNOSIS — E1122 Type 2 diabetes mellitus with diabetic chronic kidney disease: Secondary | ICD-10-CM | POA: Diagnosis present

## 2019-04-24 DIAGNOSIS — D631 Anemia in chronic kidney disease: Secondary | ICD-10-CM | POA: Diagnosis present

## 2019-04-24 DIAGNOSIS — N179 Acute kidney failure, unspecified: Secondary | ICD-10-CM | POA: Diagnosis present

## 2019-04-24 DIAGNOSIS — R739 Hyperglycemia, unspecified: Secondary | ICD-10-CM

## 2019-04-24 DIAGNOSIS — D6959 Other secondary thrombocytopenia: Secondary | ICD-10-CM | POA: Diagnosis present

## 2019-04-24 DIAGNOSIS — T8619 Other complication of kidney transplant: Secondary | ICD-10-CM | POA: Diagnosis present

## 2019-04-24 DIAGNOSIS — E876 Hypokalemia: Secondary | ICD-10-CM | POA: Diagnosis present

## 2019-04-24 DIAGNOSIS — R112 Nausea with vomiting, unspecified: Secondary | ICD-10-CM

## 2019-04-24 DIAGNOSIS — Z794 Long term (current) use of insulin: Secondary | ICD-10-CM

## 2019-04-24 DIAGNOSIS — I4891 Unspecified atrial fibrillation: Secondary | ICD-10-CM | POA: Diagnosis present

## 2019-04-24 DIAGNOSIS — K219 Gastro-esophageal reflux disease without esophagitis: Secondary | ICD-10-CM | POA: Diagnosis present

## 2019-04-24 DIAGNOSIS — N185 Chronic kidney disease, stage 5: Secondary | ICD-10-CM

## 2019-04-24 DIAGNOSIS — I482 Chronic atrial fibrillation, unspecified: Secondary | ICD-10-CM | POA: Diagnosis present

## 2019-04-24 DIAGNOSIS — Z89511 Acquired absence of right leg below knee: Secondary | ICD-10-CM | POA: Diagnosis not present

## 2019-04-24 DIAGNOSIS — E43 Unspecified severe protein-calorie malnutrition: Secondary | ICD-10-CM | POA: Diagnosis present

## 2019-04-24 DIAGNOSIS — E7800 Pure hypercholesterolemia, unspecified: Secondary | ICD-10-CM | POA: Diagnosis present

## 2019-04-24 DIAGNOSIS — E1140 Type 2 diabetes mellitus with diabetic neuropathy, unspecified: Secondary | ICD-10-CM | POA: Diagnosis present

## 2019-04-24 DIAGNOSIS — Z20828 Contact with and (suspected) exposure to other viral communicable diseases: Secondary | ICD-10-CM | POA: Diagnosis present

## 2019-04-24 DIAGNOSIS — I480 Paroxysmal atrial fibrillation: Secondary | ICD-10-CM | POA: Diagnosis not present

## 2019-04-24 DIAGNOSIS — R509 Fever, unspecified: Secondary | ICD-10-CM | POA: Diagnosis present

## 2019-04-24 DIAGNOSIS — E1165 Type 2 diabetes mellitus with hyperglycemia: Secondary | ICD-10-CM | POA: Diagnosis present

## 2019-04-24 DIAGNOSIS — R188 Other ascites: Secondary | ICD-10-CM | POA: Diagnosis present

## 2019-04-24 DIAGNOSIS — N184 Chronic kidney disease, stage 4 (severe): Secondary | ICD-10-CM | POA: Diagnosis present

## 2019-04-24 DIAGNOSIS — I129 Hypertensive chronic kidney disease with stage 1 through stage 4 chronic kidney disease, or unspecified chronic kidney disease: Secondary | ICD-10-CM | POA: Diagnosis present

## 2019-04-24 DIAGNOSIS — E785 Hyperlipidemia, unspecified: Secondary | ICD-10-CM | POA: Diagnosis present

## 2019-04-24 DIAGNOSIS — Z940 Kidney transplant status: Secondary | ICD-10-CM | POA: Diagnosis not present

## 2019-04-24 DIAGNOSIS — Z89512 Acquired absence of left leg below knee: Secondary | ICD-10-CM | POA: Diagnosis not present

## 2019-04-24 DIAGNOSIS — Y830 Surgical operation with transplant of whole organ as the cause of abnormal reaction of the patient, or of later complication, without mention of misadventure at the time of the procedure: Secondary | ICD-10-CM | POA: Diagnosis present

## 2019-04-24 LAB — BASIC METABOLIC PANEL
Anion gap: 8 (ref 5–15)
BUN: 48 mg/dL — ABNORMAL HIGH (ref 8–23)
CO2: 25 mmol/L (ref 22–32)
Calcium: 7.6 mg/dL — ABNORMAL LOW (ref 8.9–10.3)
Chloride: 106 mmol/L (ref 98–111)
Creatinine, Ser: 3.96 mg/dL — ABNORMAL HIGH (ref 0.61–1.24)
GFR calc Af Amer: 17 mL/min — ABNORMAL LOW (ref >60)
GFR calc non Af Amer: 15 mL/min — ABNORMAL LOW (ref >60)
Glucose, Bld: 259 mg/dL — ABNORMAL HIGH (ref 70–99)
Potassium: 3.2 mmol/L — ABNORMAL LOW (ref 3.5–5.1)
Sodium: 139 mmol/L (ref 135–145)

## 2019-04-24 LAB — CBC
HCT: 28.1 % — ABNORMAL LOW (ref 39.0–52.0)
Hemoglobin: 8.7 g/dL — ABNORMAL LOW (ref 13.0–17.0)
MCH: 28.9 pg (ref 26.0–34.0)
MCHC: 31 g/dL (ref 30.0–36.0)
MCV: 93.4 fL (ref 80.0–100.0)
Platelets: 98 10*3/uL — ABNORMAL LOW (ref 150–400)
RBC: 3.01 MIL/uL — ABNORMAL LOW (ref 4.22–5.81)
RDW: 14.1 % (ref 11.5–15.5)
WBC: 17.9 10*3/uL — ABNORMAL HIGH (ref 4.0–10.5)
nRBC: 0 % (ref 0.0–0.2)

## 2019-04-24 LAB — GLUCOSE, CAPILLARY
Glucose-Capillary: 138 mg/dL — ABNORMAL HIGH (ref 70–99)
Glucose-Capillary: 177 mg/dL — ABNORMAL HIGH (ref 70–99)
Glucose-Capillary: 215 mg/dL — ABNORMAL HIGH (ref 70–99)

## 2019-04-24 LAB — CBG MONITORING, ED
Glucose-Capillary: 205 mg/dL — ABNORMAL HIGH (ref 70–99)
Glucose-Capillary: 266 mg/dL — ABNORMAL HIGH (ref 70–99)
Glucose-Capillary: 335 mg/dL — ABNORMAL HIGH (ref 70–99)

## 2019-04-24 LAB — SARS CORONAVIRUS 2 (TAT 6-24 HRS): SARS Coronavirus 2: NEGATIVE

## 2019-04-24 LAB — URINALYSIS, ROUTINE W REFLEX MICROSCOPIC
Bilirubin Urine: NEGATIVE
Glucose, UA: 50 mg/dL — AB
Hgb urine dipstick: NEGATIVE
Ketones, ur: NEGATIVE mg/dL
Leukocytes,Ua: NEGATIVE
Nitrite: NEGATIVE
Protein, ur: NEGATIVE mg/dL
Specific Gravity, Urine: 1.008 (ref 1.005–1.030)
pH: 5 (ref 5.0–8.0)

## 2019-04-24 LAB — LACTIC ACID, PLASMA: Lactic Acid, Venous: 1.1 mmol/L (ref 0.5–1.9)

## 2019-04-24 LAB — HEMOGLOBIN A1C
Hgb A1c MFr Bld: 8.1 % — ABNORMAL HIGH (ref 4.8–5.6)
Mean Plasma Glucose: 185.77 mg/dL

## 2019-04-24 LAB — MAGNESIUM: Magnesium: 1.6 mg/dL — ABNORMAL LOW (ref 1.7–2.4)

## 2019-04-24 LAB — PROCALCITONIN: Procalcitonin: 57.32 ng/mL

## 2019-04-24 MED ORDER — AMLODIPINE BESYLATE 10 MG PO TABS: 10.0000 mg | ORAL_TABLET | Freq: Every day | ORAL | Status: DC

## 2019-04-24 MED ORDER — INSULIN ASPART 100 UNIT/ML ~~LOC~~ SOLN
0.0000 [IU] | Freq: Every day | SUBCUTANEOUS | Status: DC
  Administered 2019-04-24: 2 [IU] via SUBCUTANEOUS
  Administered 2019-04-24: 3 [IU] via SUBCUTANEOUS
  Administered 2019-04-25 – 2019-04-26 (×2): 2 [IU] via SUBCUTANEOUS
  Administered 2019-04-27 – 2019-04-28 (×2): 3 [IU] via SUBCUTANEOUS

## 2019-04-24 MED ORDER — ACETAMINOPHEN 325 MG PO TABS
650.0000 mg | ORAL_TABLET | Freq: Four times a day (QID) | ORAL | Status: DC | PRN
  Filled 2019-04-24: qty 2

## 2019-04-24 MED ORDER — ASPIRIN EC 81 MG PO TBEC
81.0000 mg | DELAYED_RELEASE_TABLET | Freq: Every day | ORAL | Status: DC
  Administered 2019-04-24 – 2019-04-29 (×6): 81 mg via ORAL
  Filled 2019-04-24 (×6): qty 1

## 2019-04-24 MED ORDER — POTASSIUM CHLORIDE CRYS ER 20 MEQ PO TBCR
20.0000 meq | EXTENDED_RELEASE_TABLET | Freq: Two times a day (BID) | ORAL | Status: AC
  Administered 2019-04-24 – 2019-04-25 (×3): 20 meq via ORAL
  Filled 2019-04-24 (×4): qty 1

## 2019-04-24 MED ORDER — VITAMIN D 25 MCG (1000 UNIT) PO TABS
4000.0000 [IU] | ORAL_TABLET | Freq: Every day | ORAL | Status: DC
  Administered 2019-04-24 – 2019-04-29 (×6): 4000 [IU] via ORAL
  Filled 2019-04-24 (×6): qty 4

## 2019-04-24 MED ORDER — SODIUM BICARBONATE 650 MG PO TABS
1950.0000 mg | ORAL_TABLET | Freq: Four times a day (QID) | ORAL | Status: DC
  Administered 2019-04-24 – 2019-04-29 (×20): 1950 mg via ORAL
  Filled 2019-04-24 (×24): qty 3

## 2019-04-24 MED ORDER — SODIUM CHLORIDE 0.9 % IV SOLN
INTRAVENOUS | Status: AC
  Administered 2019-04-24: 125 mL/h via INTRAVENOUS

## 2019-04-24 MED ORDER — CALCIUM ACETATE (PHOS BINDER) 667 MG PO CAPS
1334.0000 mg | ORAL_CAPSULE | Freq: Three times a day (TID) | ORAL | Status: DC
  Administered 2019-04-24 – 2019-04-29 (×15): 1334 mg via ORAL
  Filled 2019-04-24 (×15): qty 2

## 2019-04-24 MED ORDER — HEPARIN SODIUM (PORCINE) 5000 UNIT/ML IJ SOLN: 5000.0000 [IU] | Freq: Three times a day (TID) | INTRAMUSCULAR | Status: DC

## 2019-04-24 MED ORDER — INSULIN GLARGINE 100 UNIT/ML ~~LOC~~ SOLN
10.0000 [IU] | Freq: Every day | SUBCUTANEOUS | Status: DC
  Administered 2019-04-24 – 2019-04-29 (×6): 10 [IU] via SUBCUTANEOUS
  Filled 2019-04-24 (×6): qty 0.1

## 2019-04-24 MED ORDER — PREDNISONE 5 MG PO TABS
5.0000 mg | ORAL_TABLET | Freq: Every day | ORAL | Status: DC
  Administered 2019-04-25 – 2019-04-29 (×5): 5 mg via ORAL
  Filled 2019-04-24 (×5): qty 1

## 2019-04-24 MED ORDER — METRONIDAZOLE IN NACL 5-0.79 MG/ML-% IV SOLN
500.0000 mg | Freq: Three times a day (TID) | INTRAVENOUS | Status: DC
  Administered 2019-04-24 – 2019-04-26 (×7): 500 mg via INTRAVENOUS
  Filled 2019-04-24 (×7): qty 100

## 2019-04-24 MED ORDER — MAGNESIUM CHLORIDE 64 MG PO TBEC
2.0000 | DELAYED_RELEASE_TABLET | Freq: Two times a day (BID) | ORAL | Status: DC
  Administered 2019-04-24 – 2019-04-29 (×10): 128 mg via ORAL
  Filled 2019-04-24 (×11): qty 2

## 2019-04-24 MED ORDER — INSULIN ASPART 100 UNIT/ML ~~LOC~~ SOLN
0.0000 [IU] | Freq: Three times a day (TID) | SUBCUTANEOUS | Status: DC
  Administered 2019-04-24: 3 [IU] via SUBCUTANEOUS
  Administered 2019-04-24: 1 [IU] via SUBCUTANEOUS
  Administered 2019-04-24: 2 [IU] via SUBCUTANEOUS
  Administered 2019-04-25 – 2019-04-26 (×2): 3 [IU] via SUBCUTANEOUS
  Administered 2019-04-26 – 2019-04-27 (×3): 5 [IU] via SUBCUTANEOUS
  Administered 2019-04-27: 7 [IU] via SUBCUTANEOUS
  Administered 2019-04-28: 3 [IU] via SUBCUTANEOUS
  Administered 2019-04-28: 2 [IU] via SUBCUTANEOUS
  Administered 2019-04-28: 3 [IU] via SUBCUTANEOUS
  Administered 2019-04-29: 1 [IU] via SUBCUTANEOUS
  Administered 2019-04-29: 3 [IU] via SUBCUTANEOUS

## 2019-04-24 MED ORDER — TAMSULOSIN HCL 0.4 MG PO CAPS
0.4000 mg | ORAL_CAPSULE | Freq: Every day | ORAL | Status: DC
  Administered 2019-04-24 – 2019-04-28 (×5): 0.4 mg via ORAL
  Filled 2019-04-24 (×5): qty 1

## 2019-04-24 MED ORDER — TACROLIMUS 1 MG PO CAPS
3.0000 mg | ORAL_CAPSULE | Freq: Two times a day (BID) | ORAL | Status: DC
  Administered 2019-04-24 – 2019-04-29 (×11): 3 mg via ORAL
  Filled 2019-04-24 (×11): qty 3

## 2019-04-24 MED ORDER — B COMPLEX-C PO TABS
1.0000 | ORAL_TABLET | Freq: Every day | ORAL | Status: DC
  Administered 2019-04-24 – 2019-04-29 (×6): 1 via ORAL
  Filled 2019-04-24 (×6): qty 1

## 2019-04-24 MED ORDER — APIXABAN 5 MG PO TABS
5.0000 mg | ORAL_TABLET | Freq: Two times a day (BID) | ORAL | Status: DC
  Administered 2019-04-24 – 2019-04-29 (×11): 5 mg via ORAL
  Filled 2019-04-24 (×11): qty 1

## 2019-04-24 MED ORDER — CALCITRIOL 0.25 MCG PO CAPS
0.2500 ug | ORAL_CAPSULE | Freq: Every day | ORAL | Status: DC
  Administered 2019-04-24 – 2019-04-29 (×6): 0.25 ug via ORAL
  Filled 2019-04-24 (×6): qty 1

## 2019-04-24 MED ORDER — PANTOPRAZOLE SODIUM 40 MG PO TBEC
40.0000 mg | DELAYED_RELEASE_TABLET | Freq: Every day | ORAL | Status: DC
  Administered 2019-04-24 – 2019-04-29 (×6): 40 mg via ORAL
  Filled 2019-04-24 (×6): qty 1

## 2019-04-24 MED ORDER — ONDANSETRON HCL 4 MG/2ML IJ SOLN: 4.0000 mg | Freq: Four times a day (QID) | INTRAMUSCULAR | Status: DC | PRN

## 2019-04-24 MED ORDER — ACETAMINOPHEN 650 MG RE SUPP: 650.0000 mg | Freq: Four times a day (QID) | RECTAL | Status: DC | PRN

## 2019-04-24 MED ORDER — GLUCERNA SHAKE PO LIQD
237.0000 mL | Freq: Three times a day (TID) | ORAL | Status: DC
  Administered 2019-04-25 – 2019-04-29 (×12): 237 mL via ORAL

## 2019-04-24 MED ORDER — HYDRALAZINE HCL 50 MG PO TABS: 50.0000 mg | ORAL_TABLET | Freq: Two times a day (BID) | ORAL | Status: DC

## 2019-04-24 MED ORDER — ATORVASTATIN CALCIUM 10 MG PO TABS
10.0000 mg | ORAL_TABLET | ORAL | Status: DC
  Administered 2019-04-26 – 2019-04-28 (×2): 10 mg via ORAL
  Filled 2019-04-24 (×2): qty 1

## 2019-04-24 MED ORDER — FUROSEMIDE 80 MG PO TABS: 160.0000 mg | ORAL_TABLET | Freq: Two times a day (BID) | ORAL | Status: DC

## 2019-04-24 NOTE — ED Notes (Signed)
ED TO INPATIENT HANDOFF REPORT  ED Nurse Name and Phone #:  Shontelle Muska [phone]  S Name/Age/Gender Paul [name] 63 y.o. male Room/Bed: 057C/057C  Code Status   Code Status: Full Code  Home/SNF/Other Home Patient oriented to: self, place and situation Is this baseline? Yes   Triage Complete: Triage complete  Chief Complaint hyperglycemia  Triage Note Pt arrived via [facility] from home for c/o generalized weakness, pt states vomited Mon and had dry heaves today. Pt states temp 100.67F axillary. Pt took glucose at home and it was 441 and took basaglar 10 units before coming. Pt states gained 16lbs in 3wks. Pt is A&Ox4. NSR on monitor.   Allergies Allergies  Allergen Reactions  . Metformin And Related Other (See Comments)    Has kidney problems    Level of Care/Admitting Diagnosis ED Disposition    ED Disposition Condition Comment   Admit  Hospital Area: [facility] [[id number]]  Level of Care: Med-Surg [16]  I expect the patient will be discharged within 24 hours: Yes  LOW acuity---Tx typically complete <24 hrs---ACUTE conditions typically can be evaluated <24 hours---LABS likely to return to acceptable levels <24 hours---IS near functional baseline---EXPECTED to return to current living arrangement---NOT newly hypoxic: Meets criteria for 5C-Observation unit  Covid Evaluation: Confirmed COVID Negative  Diagnosis: Fever [[id number]]  Admitting Physician: Shela Leff [[id number]]  Attending Physician: Shela Leff [[id number]]  PT Class (Do Not Modify): Observation [104]  PT Acc Code (Do Not Modify): Observation [10022]       B Medical/Surgery History Past Medical History:  Diagnosis Date  . A-fib ([place])   . Anemia    esrd  . Calciphylaxis 05/2013   lower ext  . Cancer ([place])    hx duodenal adenoCa 2002, resected  . Closed left arm fracture 1967  . Corneal abrasion, left   . Depression   . Diabetes mellitus    Type 1 iddm x 11 yrs  . Diabetic  neuropathy ([place])   . ESRD on hemodialysis ([place])    Pt has ESRD due to DM.  He had a L upper arm AVF prior to starting HD which was ligated due to L arm steal syndrome.  He started HD in Jan 2014 and did home HD.  The family couldn't cannulate the R arm AVF successfully so by mid 2014 they decided to switch to PD which was done late summer 2014.  In Jan 2015 he was admitted with FTT felt to be due to underdialysis on PD and PD was abandoned and he   . GERD (gastroesophageal reflux disease)   . Glomerulosclerosis, diabetic ([place])   . Heart murmur   . History of blood transfusion   . Hypertension    off of meds due to orthostatic hypotension  . Kidney transplant recipient   . Open wound of both legs with complication    Pt has had progressive wounds of both LE's including gangrene of the toes and patchy necrosis of the calves.  He has been treated at [facility] by Dr Jerline Pain with hyperbaric O2 5d per week.  He is getting Na Thiosulfate with HD for suspected calciphylaxis. Pt says doctor's aren't sure if these ulcers were diabetic ulcers or calciphylaxis.  He underwent bilat BKA on 07/30/13.  He says that the woun   Past Surgical History:  Procedure Laterality Date  . AMPUTATION Bilateral 07/30/2013   Procedure: AMPUTATION BELOW KNEE;  Surgeon: Newt Minion, MD;  Location: [facility];  Service: Orthopedics;  Laterality: Bilateral;  Bilateral Below Knee Amputations  . AMPUTATION Bilateral 09/03/2013   Procedure: AMPUTATION BELOW KNEE;  Surgeon: Newt Minion, MD;  Location: [facility];  Service: Orthopedics;  Laterality: Bilateral;  Bilateral Below Knee Amputation Revision  . AV FISTULA PLACEMENT  06/07/2011   Procedure: ARTERIOVENOUS (AV) FISTULA CREATION;  Surgeon: Angelia Mould, MD;  Location: [place];  Service: Vascular;  Laterality: Left;  . AV FISTULA PLACEMENT  06/18/2012   Procedure: ARTERIOVENOUS (AV) FISTULA CREATION;  Surgeon: Mal Misty, MD;  Location: [facility];  Service: Vascular;  Laterality:  Right;  [place]  . BILIARY DIVERSION, EXTERNAL  2002  . BONE MARROW BIOPSY  2012  . CAPD INSERTION N/A 01/14/2013   Procedure: LAPAROSCOPIC INSERTION CONTINUOUS AMBULATORY PERITONEAL DIALYSIS  (CAPD) CATHETER;  Surgeon: Adin Hector, MD;  Location: [facility];  Service: General;  Laterality: N/A;  . CAPD REMOVAL N/A 07/09/2013   Procedure: CONTINUOUS AMBULATORY PERITONEAL DIALYSIS  (CAPD) CATHETER REMOVAL;  Surgeon: Adin Hector, MD;  Location: [facility];  Service: General;  Laterality: N/A;  . COLONOSCOPY     Hx: of  . INSERTION OF DIALYSIS CATHETER  05/22/2012   Procedure: INSERTION OF DIALYSIS CATHETER;  Surgeon: Mal Misty, MD;  Location: [facility];  Service: Vascular;  Laterality: N/A;  right internal jugular vein  . IR ANGIO INTRA EXTRACRAN SEL COM CAROTID INNOMINATE BILAT MOD SED  10/28/2018  . IR ANGIO VERTEBRAL SEL SUBCLAVIAN INNOMINATE UNI R MOD SED  10/28/2018  . IR ANGIO VERTEBRAL SEL VERTEBRAL UNI L MOD SED  10/28/2018  . LIGATION OF ARTERIOVENOUS  FISTULA  05/22/2012   Procedure: LIGATION OF ARTERIOVENOUS  FISTULA;  Surgeon: Mal Misty, MD;  Location: [facility];  Service: Vascular;  Laterality: Left;  Left brachio-cephalic fistula  . LOWER EXTREMITY ANGIOGRAM N/A 06/14/2013   Procedure: LOWER EXTREMITY ANGIOGRAM;  Surgeon: Angelia Mould, MD;  Location: [facility] CATH LAB;  Service: Cardiovascular;  Laterality: N/A;  . NEPHRECTOMY TRANSPLANTED ORGAN    . OTHER SURGICAL HISTORY     Cyst removed from back  . PORTACATH PLACEMENT  2002  . RADIOLOGY WITH ANESTHESIA N/A 10/28/2018   Procedure: IR WITH ANESTHESIA;  Surgeon: Radiologist, Medication, MD;  Location: [facility];  Service: Radiology;  Laterality: N/A;  . REMOVAL OF A DIALYSIS CATHETER    . RENAL BIOPSY, PERCUTANEOUS  2012  . TONSILLECTOMY    . VASCULAR SURGERY    . WHIPPLE PROCEDURE  2002   duodenal ca, Dr. Renard Hamper IV Location/Drains/Wounds Patient Lines/Drains/Airways Status   Active Line/Drains/Airways    Name:   Placement  date:   Placement time:   Site:   Days:   Peripheral IV 04/23/19 Left;Anterior Forearm   04/23/19    1808    Forearm   1   Peripheral IV 04/24/19 Left Antecubital   04/24/19    0040    Antecubital   less than 1   Fistula / Graft Right Forearm Arteriovenous fistula   05/22/12    0831    Forearm   2528   Vascular Access Right  Arteriovenous fistula   06/18/12    0804    -   2501   Fistula / Graft Right Forearm   -    -    Forearm      External Urinary Catheter   10/29/18    1745    -   177   Pressure Ulcer 07/11/13 Stage I -  Intact skin with non-blanchable redness of a localized area usually over a bony prominence.    07/11/13    1500     2113   Pressure Ulcer 08/10/13 Stage II -  Partial thickness loss of dermis presenting as a shallow open ulcer with a red, pink wound bed without slough.   08/10/13    2300     2083   Pressure Injury 05/24/18 Stage II -  Partial thickness loss of dermis presenting as a shallow open ulcer with a red, pink wound bed without slough. BKA, were prostesis has rubbed   05/24/18    0120     335          Intake/Output Last 24 hours  Intake/Output Summary (Last 24 hours) at 04/24/2019 1136 Last data filed at 04/24/2019 0708 Gross per 24 hour  Intake 1912.68 ml  Output -  Net 1912.68 ml    Labs/Imaging Results for orders placed or performed during the hospital encounter of 04/23/19 (from the past 48 hour(s))  CBC with Differential     Status: Abnormal   Collection Time: 04/23/19  6:08 PM  Result Value Ref Range   WBC 21.5 (H) 4.0 - 10.5 K/uL   RBC 2.92 (L) 4.22 - 5.81 MIL/uL   Hemoglobin 8.4 (L) 13.0 - 17.0 g/dL   HCT 27.1 (L) 39.0 - 52.0 %   MCV 92.8 80.0 - 100.0 fL   MCH 28.8 26.0 - 34.0 pg   MCHC 31.0 30.0 - 36.0 g/dL   RDW 14.2 11.5 - 15.5 %   Platelets 112 (L) 150 - 400 K/uL    Comment: REPEATED TO VERIFY PLATELET COUNT CONFIRMED BY SMEAR    nRBC 0.0 0.0 - 0.2 %   Neutrophils Relative % 91 %   Neutro Abs 19.7 (H) 1.7 - 7.7 K/uL   Lymphocytes  Relative 4 %   Lymphs Abs 0.8 0.7 - 4.0 K/uL   Monocytes Relative 4 %   Monocytes Absolute 0.8 0.1 - 1.0 K/uL   Eosinophils Relative 0 %   Eosinophils Absolute 0.0 0.0 - 0.5 K/uL   Basophils Relative 0 %   Basophils Absolute 0.0 0.0 - 0.1 K/uL   Immature Granulocytes 1 %   Abs Immature Granulocytes 0.15 (H) 0.00 - 0.07 K/uL    Comment: Performed at [facility], [address] [place], [place] [address]  Comprehensive metabolic panel     Status: Abnormal   Collection Time: 04/23/19  6:08 PM  Result Value Ref Range   Sodium 137 135 - 145 mmol/L   Potassium 3.8 3.5 - 5.1 mmol/L   Chloride 100 98 - 111 mmol/L   CO2 25 22 - 32 mmol/L   Glucose, Bld 390 (H) 70 - 99 mg/dL   BUN 49 (H) 8 - 23 mg/dL   Creatinine, Ser 4.25 (H) 0.61 - 1.24 mg/dL   Calcium 8.1 (L) 8.9 - 10.3 mg/dL   Total Protein 3.9 (L) 6.5 - 8.1 g/dL   Albumin 1.9 (L) 3.5 - 5.0 g/dL   AST 24 15 - 41 U/L   ALT 12 0 - 44 U/L   Alkaline Phosphatase 78 38 - 126 U/L   Total Bilirubin 0.9 0.3 - 1.2 mg/dL   GFR calc non Af Amer 14 (L) >60 mL/min   GFR calc Af Amer 16 (L) >60 mL/min   Anion gap 12 5 - 15    Comment: Performed at  Hospital Lab, [address] [place], [place]   [address]  Lipase, blood     Status: None   Collection Time: 04/23/19  6:08 PM  Result Value Ref Range   Lipase 14 11 - 51 U/L    Comment: Performed at [facility], [place] [address], [place], [place] [address]  POC SARS Coronavirus 2 Ag-ED - Nasal Swab (BD Veritor Kit)     Status: None   Collection Time: 04/23/19  8:10 PM  Result Value Ref Range   SARS Coronavirus 2 Ag NEGATIVE NEGATIVE    Comment: (NOTE) SARS-CoV-2 antigen NOT DETECTED.  Negative results are presumptive.  Negative results do not preclude SARS-CoV-2 infection and should not be used as the sole basis for treatment or other patient management decisions, including infection  control decisions, particularly in the presence of clinical signs and  symptoms consistent  with COVID-19, or in those who have been in contact with the virus.  Negative results must be combined with clinical observations, patient history, and epidemiological information. The expected result is Negative. Fact Sheet for Patients: PodPark.tn Fact Sheet for Healthcare Providers: GiftContent.is This test is not yet approved or cleared by the [place] FDA and  has been authorized for detection and/or diagnosis of SARS-CoV-2 by FDA under an Emergency Use Authorization (EUA).  This EUA will remain in effect (meaning this test can be used) for the duration of  the COVID-19 de claration under Section 564(b)(1) of the Act, 21 U.S.C. section 360bbb-3(b)(1), unless the authorization is terminated or revoked sooner.   SARS CORONAVIRUS 2 (TAT 6-24 HRS) Nasopharyngeal Nasopharyngeal Swab     Status: None   Collection Time: 04/23/19 11:53 PM   Specimen: Nasopharyngeal Swab  Result Value Ref Range   SARS Coronavirus 2 NEGATIVE NEGATIVE    Comment: (NOTE) SARS-CoV-2 target nucleic acids are NOT DETECTED. The SARS-CoV-2 RNA is generally detectable in upper and lower respiratory specimens during the acute phase of infection. Negative results do not preclude SARS-CoV-2 infection, do not rule out co-infections with other pathogens, and should not be used as the sole basis for treatment or other patient management decisions. Negative results must be combined with clinical observations, patient history, and epidemiological information. The expected result is Negative. Fact Sheet for Patients: SugarRoll.be Fact Sheet for Healthcare Providers: https://www.woods-mathews.com/ This test is not yet approved or cleared by the [place] FDA and  has been authorized for detection and/or diagnosis of SARS-CoV-2 by FDA under an Emergency Use Authorization (EUA). This EUA will remain  in effect  (meaning this test can be used) for the duration of the COVID-19 declaration under Section 56 4(b)(1) of the Act, 21 U.S.C. section 360bbb-3(b)(1), unless the authorization is terminated or revoked sooner. Performed at [facility], [place] [address], [place], [place] [address]   Lactic acid, plasma     Status: None   Collection Time: 04/23/19 11:53 PM  Result Value Ref Range   Lactic Acid, Venous 1.1 0.5 - 1.9 mmol/L    Comment: Performed at [facility] [address] [place], [place] [address]  CBG monitoring, ED     Status: Abnormal   Collection Time: 04/24/19 12:21 AM  Result Value Ref Range   Glucose-Capillary 335 (H) 70 - 99 mg/dL  CBG monitoring, ED     Status: Abnormal   Collection Time: 04/24/19  2:48 AM  Result Value Ref Range   Glucose-Capillary 266 (H) 70 - 99 mg/dL  Urinalysis, Routine w reflex microscopic     Status: Abnormal  Collection Time: 04/24/19  5:20 AM  Result Value Ref Range   Color, Urine YELLOW YELLOW   APPearance CLEAR CLEAR   Specific Gravity, Urine 1.008 1.005 - 1.030   pH 5.0 5.0 - 8.0   Glucose, UA 50 (A) NEGATIVE mg/dL   Hgb urine dipstick NEGATIVE NEGATIVE   Bilirubin Urine NEGATIVE NEGATIVE   Ketones, ur NEGATIVE NEGATIVE mg/dL   Protein, ur NEGATIVE NEGATIVE mg/dL   Nitrite NEGATIVE NEGATIVE   Leukocytes,Ua NEGATIVE NEGATIVE    Comment: Performed at [facility] [address] [place], [place] [address]  CBC     Status: Abnormal   Collection Time: 04/24/19  5:20 AM  Result Value Ref Range   WBC 17.9 (H) 4.0 - 10.5 K/uL   RBC 3.01 (L) 4.22 - 5.81 MIL/uL   Hemoglobin 8.7 (L) 13.0 - 17.0 g/dL   HCT 28.1 (L) 39.0 - 52.0 %   MCV 93.4 80.0 - 100.0 fL   MCH 28.9 26.0 - 34.0 pg   MCHC 31.0 30.0 - 36.0 g/dL   RDW 14.1 11.5 - 15.5 %   Platelets 98 (L) 150 - 400 K/uL    Comment: Immature Platelet Fraction may be clinically indicated, consider ordering this additional test [id number] CONSISTENT WITH PREVIOUS RESULT     nRBC 0.0 0.0 - 0.2 %    Comment: Performed at [facility], [place] [address], [place], [place] [address]  Basic metabolic panel     Status: Abnormal   Collection Time: 04/24/19  5:20 AM  Result Value Ref Range   Sodium 139 135 - 145 mmol/L   Potassium 3.2 (L) 3.5 - 5.1 mmol/L   Chloride 106 98 - 111 mmol/L   CO2 25 22 - 32 mmol/L   Glucose, Bld 259 (H) 70 - 99 mg/dL   BUN 48 (H) 8 - 23 mg/dL   Creatinine, Ser 3.96 (H) 0.61 - 1.24 mg/dL   Calcium 7.6 (L) 8.9 - 10.3 mg/dL   GFR calc non Af Amer 15 (L) >60 mL/min   GFR calc Af Amer 17 (L) >60 mL/min   Anion gap 8 5 - 15    Comment: Performed at [facility] [address] [place], [place] [address]  Hemoglobin A1c     Status: Abnormal   Collection Time: 04/24/19  5:20 AM  Result Value Ref Range   Hgb A1c MFr Bld 8.1 (H) 4.8 - 5.6 %    Comment: (NOTE) Pre diabetes:          5.7%-6.4% Diabetes:              >6.4% Glycemic control for   <7.0% adults with diabetes    Mean Plasma Glucose 185.77 mg/dL    Comment: Performed at [facility] [address], [place], [place] [address]  Procalcitonin - Baseline     Status: None   Collection Time: 04/24/19  5:20 AM  Result Value Ref Range   Procalcitonin 57.32 ng/mL    Comment:        Interpretation: PCT >= 10 ng/mL: Important systemic inflammatory response, almost exclusively due to severe bacterial sepsis or septic shock. (NOTE)       Sepsis PCT Algorithm           Lower Respiratory Tract  Infection PCT Algorithm    ----------------------------     ----------------------------         PCT < 0.25 ng/mL                PCT < 0.10 ng/mL         Strongly encourage             Strongly discourage   discontinuation of antibiotics    initiation of antibiotics    ----------------------------     -----------------------------       PCT 0.25 - 0.50 ng/mL            PCT 0.10 - 0.25 ng/mL               OR       >80% decrease in PCT             Discourage initiation of                                            antibiotics      Encourage discontinuation           of antibiotics    ----------------------------     -----------------------------         PCT >= 0.50 ng/mL              PCT 0.26 - 0.50 ng/mL                AND       <80% decrease in PCT             Encourage initiation of                                             antibiotics       Encourage continuation           of antibiotics    ----------------------------     -----------------------------        PCT >= 0.50 ng/mL                  PCT > 0.50 ng/mL               AND         increase in PCT                  Strongly encourage                                      initiation of antibiotics    Strongly encourage escalation           of antibiotics                                     -----------------------------                                           PCT <= 0.25 ng/mL                                                   OR                                        > 80% decrease in PCT                                     Discontinue / Do not initiate                                             antibiotics Performed at [facility], [place] [address] [place], [place] [address]   CBG monitoring, ED     Status: Abnormal   Collection Time: 04/24/19  8:30 AM  Result Value Ref Range   Glucose-Capillary 205 (H) 70 - 99 mg/dL   Ct Abdomen Pelvis Wo Contrast  Result Date: 04/23/2019 CLINICAL DATA:  Acute generalized abdominal pain, history end-stage renal disease on dialysis, type I diabetes mellitus, GERD, hypertension, atrial fibrillation EXAM: CT ABDOMEN AND PELVIS WITHOUT CONTRAST TECHNIQUE: Multidetector CT imaging of the abdomen and pelvis was performed following the standard protocol without IV contrast. Sagittal and coronal MPR images reconstructed from axial data set. No oral contrast. COMPARISON:  02/15/2019 FINDINGS: Lower chest: Small LEFT pleural  effusion. Dependent bibasilar atelectasis. Minimal pericardial fluid. Hepatobiliary: Fatty infiltration of liver. Gallbladder surgically absent. Chronic pneumobilia. No definite hepatic mass. Pancreas: Atrophic Spleen: Normal appearance Adrenals/Urinary Tract: Adrenal glands normal appearance. Atrophic native kidneys containing calculi and renal vascular calcifications. Transplant kidney RIGHT iliac fossa. No evidence of renal mass or hydronephrosis. Bladder well distended. Stomach/Bowel: Fluid within stomach with questionable gastric wall thickening versus artifact from underdistention. Large and small bowel loops unremarkable. Vascular/Lymphatic: Extensive atherosclerotic calcifications of both large and small vessels. Aorta normal caliber. Multiple pelvic phleboliths. No adenopathy. Reproductive: Mild prostatic enlargement gland 4.6 x 3.9 cm. Other: Significant ascites. Scattered soft tissue edema. No free air or hernia. Musculoskeletal: Osseous demineralization. IMPRESSION: Scattered ascites and soft tissue edema, nonspecific. Small LEFT pleural effusion with bibasilar atelectasis. Fatty infiltration of liver. Transplant kidney RIGHT iliac fossa without evidence of mass or hydronephrosis. Mild prostatic enlargement. Chronic pneumobilia suspect from prior biliary intervention. No other acute intra-abdominal or intrapelvic abnormalities. Aortic Atherosclerosis (ICD10-I70.0). Electronically Signed   By: Lavonia Dana M.D.   On: 04/23/2019 21:00   US Renal  Result Date: 04/24/2019 CLINICAL DATA:  Initial evaluation for elevated BUN and creatinine, acute renal injury. EXAM: RENAL / URINARY TRACT ULTRASOUND COMPLETE COMPARISON:  Prior CT from 04/23/2019. FINDINGS: Right Kidney: Renal measurements: 8.0 x 2.6 x 4.0 cm = volume: 42.7 mL. Right kidney atrophic in appearance with diffuse cortical thinning and increased echogenicity within the renal parenchyma. 9 mm shadowing stone present at the upper pole. No  hydronephrosis. No focal renal mass. Left Kidney: Renal measurements: 8.6 x 5.0 x 5.3 cm = volume: 116.0 mL. Left kidney atrophic in appearance with diffuse cortical thinning and increased echogenicity within the renal parenchyma. No visible nephrolithiasis or hydronephrosis. No focal renal mass. Bladder: Appears normal for degree of bladder distention. Prostate measures 4.5 x 2.5 x 4.8 cm. Other: Small volume ascites noted within the visualized abdomen. Increased echogenicity noted within the visualized liver, suggesting steatosis. IMPRESSION: 1. Renal atrophy with diffuse cortical thinning  and increased echogenicity within the renal parenchyma, compatible with chronic medical renal disease. 2. No hydronephrosis. 3. 9 mm nonobstructive right renal nephrolithiasis. 4. Small volume ascites. 5. Hepatic steatosis. Electronically Signed   By: Benjamin  McClintock M.D.   On: 04/24/2019 04:27   Dg Chest Port 1 View  Result Date: 04/23/2019 CLINICAL DATA:  Fever EXAM: PORTABLE CHEST 1 VIEW COMPARISON:  None. FINDINGS: The heart size and mediastinal contours are within normal limits. Both lungs are clear. The visualized skeletal structures are unremarkable. IMPRESSION: No active disease. Electronically Signed   By: Taylor  Stroud M.D.   On: 04/23/2019 20:15    Pending Labs Unresulted Labs (From admission, onward)    Start     Ordered   04/23/19 2256  Culture, blood (routine x 2)  BLOOD CULTURE X 2,   STAT     04/23/19 2256          Vitals/Pain Today's Vitals   04/24/19 0715 04/24/19 0730 04/24/19 0915 04/24/19 1100  BP: 132/64 (!) 144/62  129/71  Pulse: (!) 56 (!) 57  60  Resp: 14 (!) 0  17  Temp:      TempSrc:      SpO2: 98% 100%  99%  Weight:      Height:      PainSc:   0-No pain     Isolation Precautions Airborne and Contact precautions  Medications Medications  vancomycin (VANCOCIN) 1,250 mg in sodium chloride 0.9 % 250 mL IVPB (has no administration in time range)  ceFEPIme  (MAXIPIME) 2 g in sodium chloride 0.9 % 100 mL IVPB (has no administration in time range)  metroNIDAZOLE (FLAGYL) IVPB 500 mg (0 mg Intravenous Stopped 04/24/19 0708)  heparin injection 5,000 Units (has no administration in time range)  acetaminophen (TYLENOL) tablet 650 mg (has no administration in time range)    Or  acetaminophen (TYLENOL) suppository 650 mg (has no administration in time range)  insulin aspart (novoLOG) injection 0-9 Units (3 Units Subcutaneous Given 04/24/19 0840)  insulin aspart (novoLOG) injection 0-5 Units (3 Units Subcutaneous Given 04/24/19 0301)  0.9 %  sodium chloride infusion (125 mL/hr Intravenous New Bag/Given 04/24/19 0244)  ondansetron (ZOFRAN) injection 4 mg (has no administration in time range)  sodium chloride 0.9 % bolus 1,000 mL (0 mLs Intravenous Stopped 04/24/19 0015)  ondansetron (ZOFRAN) injection 4 mg (4 mg Intravenous Given 04/23/19 1928)  ceFEPIme (MAXIPIME) 2 g in sodium chloride 0.9 % 100 mL IVPB (0 g Intravenous Stopped 04/24/19 0241)  metroNIDAZOLE (FLAGYL) IVPB 500 mg (0 mg Intravenous Stopped 04/24/19 0241)  vancomycin (VANCOCIN) 1,500 mg in sodium chloride 0.9 % 500 mL IVPB (0 mg Intravenous Stopped 04/24/19 0508)    Mobility walks Moderate fall risk   Focused Assessments general   R Recommendations: See Admitting Provider Note  Report given to:   Additional Notes:    

## 2019-04-24 NOTE — Progress Notes (Signed)
Initial Nutrition Assessment  DOCUMENTATION CODES:   Not applicable  INTERVENTION:  -Glucerna Shake po TID, each supplement provides 220 kcal and 10 grams of protein -B complex with C po daily  Monitor magnesium, potassium, and phosphorus daily for at least 3 days, MD to replete as needed, as pt is at risk for refeeding syndrome given pt dietary recall and current labs.   NUTRITION DIAGNOSIS:   Inadequate oral intake related to acute illness, chronic illness(fever and leukocytosis; h/o renal transplant, CKDIV; duodenal adenocarcinoma s/p resection) as evidenced by per patient/family report(po intake of small snacks 1-2/day x 3 weeks).  GOAL:   Patient will meet greater than or equal to 90% of their needs  MONITOR:   PO intake, Labs, I & O's, Supplement acceptance, Weight trends  REASON FOR ASSESSMENT:   Consult Assessment of nutrition requirement/status  ASSESSMENT:  RD working remotely.  63 year old male with past medical history of renal transplant, CKD IV, calciphylaxis, anemia of chronic disease, a-fib, duodenal adenocarcinoma s/p resection, IDDM, diabetic neuropathy s/p bilateral BKA and HTN. Patient presented to ED for evaluation of fever, nausea/vomiting, hyperglycemia and reports not feeling well over the past few days and bilateral hand swelling for the past month.  Patient admitted with fever and leukocytosis; possible infection of unknown source; ID consult pending return of preliminarily blood cultures in the morning.    Per chart review, wife of patient reports that over the past 3 months patient has been doing less for himself and spending more and more time in bed. Spoke with patient via phone. He reports feeling okay and enjoying watching some football this afternoon. Patient recalls eating most of his chx/dumplings, a few bites of a fruit cup and pudding for lunch today. Patient endorses decreased appetite over the past few weeks, recalls eating very little 1-2  times/day and stated he would have "a cup of yogurt or something like that" Patient denied consuming nutrition supplements at home; amenable to vanilla Glucerna. RD will provide three times daily as well as multivitamin.   Labs notable for hypokalemia, hypomagnesia; recommend continuing to monitor K/Mg as well as Phosphorus. Patient at risk for refeeding d/t ongoing poor oral intake per dietary recall.   Current wt 83 kg (182.6 lb) non-pitting BUE edema per review of RN assessment.  Patient weights have fluctuated 74.8 kg - 83 kg over the past year. Patient reports that he has probably had some weight loss due to poor intake, unable to recall UBW.  04/20/19 83 kg  02/26/19 77.3 kg  01/21/19 80.7 kg  01/15/19 80.7 kg  12/21/18 77.6 kg  11/04/18 77.6 kg  05/24/18 74.8 kg   Medications reviewed and include: Calcitriol 0.25 mcg daily, Phoslo 1334 mg TID, Vitamin D3 4000 units daily, SSI, Lantus 10 units daily, MgCl 128 mg BID, Protonix 40 mg daily Maxipime 2g in NaCl 100 ml IVPB Flagyl 500 mg IVPB every 8 hrs  Labs: CBGs  138-335, K 3.2 (L), BUN 48 (H), Cr 3.96 (H), Albumin 1.9 (L), Ca corrects 9.3, WBC 17.9 (H), Hgb 8.7 (L)  NUTRITION - FOCUSED PHYSICAL EXAM: Unable to complete at this time, RD working remotely.  Diet Order:   Diet Order            Diet Carb Modified Fluid consistency: Thin; Room service appropriate? Yes  Diet effective now              EDUCATION NEEDS:   No education needs have been identified at this time  Skin:  Skin Assessment: Reviewed RN Assessment  Last BM:  12/5  Height:   Ht Readings from Last 1 Encounters:  04/23/19 6' (1.829 m)    Weight:   Wt Readings from Last 1 Encounters:  04/23/19 83 kg    Ideal Body Weight:  71.4 kg(Adjusted IBW for bilateral BKA)  BMI:  Body mass index is 24.82 kg/m.  Estimated Nutritional Needs:   Kcal:  5631-4970  Protein:  100-108  Fluid:  >/= 2 L/day   Lajuan Lines, RD, LDN Clinical  [facility] Telephone [phone] After Hours/Weekend Pager: [phone]

## 2019-04-24 NOTE — ED Notes (Signed)
Report given to 6N

## 2019-04-24 NOTE — ED Notes (Signed)
Unable to get a second set of Blood cultures, 2 IV's present, but not enough blood returned. Only one extremity available for blood draw.

## 2019-04-24 NOTE — Progress Notes (Signed)
Progress Note    Paul [name]  QPY:[id number] DOB: 24-Sep-1955  DOA: 04/23/2019 PCP: [name], MD    Brief Narrative:     Medical records reviewed and are as summarized below:  Paul [name] is an 63 y.o. male with medical history significant of renal transplant, CKD stage IV, calciphylaxis, anemia of chronic disease, A. Fib, duodenal adenocarcinoma status post resection, insulin-dependent diabetes mellitus, diabetic neuropathy, status post bilateral BKA, hypertension presenting to the ED for evaluation of fever, nausea/vomiting, and hyperglycemia.  Patient states he has not been feeling well for the past few days.  Today he had a temperature of 100.3 F.   Assessment/Plan:   Principal Problem:   Fever Active Problems:   Diabetes mellitus, type 2 (HCC)   AKI (acute kidney injury) ([place])   Hyperglycemia   Nausea and vomiting    Fever and leukocytosis - possible infection; unknown source Patient presenting with complaints of a fever of 100.3 F.  No antipyretics taken at home and on vitals done in the ED he is afebrile.  Does have significant leukocytosis with WBC count 12.4 with neutrophilic predominance.  Lactic acid normal.  Hemodynamically stable. Chest x-ray not suggestive of pneumonia.  U/A negative CT abdomen pelvis without obvious infectious source - scattered ascites and soft tissue edema, non specific.  SBP less likely as no ascites or abdominal tenderness appreciated on exam. -Continue broad-spectrum antibiotics including vancomycin, cefepime, and metronidazole at this time. -Blood culture pending -trend WBC count -SARS-CoV-2 test negative -procalcitonin level markedly elevated but he is a transplant patient so not sure how to interpret -will nephrology consult as patient had just been seen by Dr. Lorrene Reid and she did multiple labs -? ID consult as he is immunocompromised-- ? opportunistic infection?  Need for brain imaging?-- will consider consult in AM once blood  culture are preliminarily back   AKI on CKD stage IV, history of renal transplant BUN 49, creatinine 4.2.  Creatinine ranging between 3.1-4.1 on labs done in June 2020.  Most recent prior lab reading was creatinine 3.6 on 6/17.  Suspect prerenal due to decreased p.o. intake/vomiting. -IV fluid hydration -Cr improved after hydration  Hyperglycemia, insulin-dependent diabetes mellitus Patient has not been able to take insulin for the past few days as he has been vomiting and not feeling well.  -SSI -resume insulin  Nausea and vomiting -Lipase and LFTs normal.  Abdominal exam benign.  Abdominal CT without obvious source. -IV fluid hydration -Zofran as needed -? Viral etiology?  Anemia of chronic renal disease Hemoglobin 8.4, ranging between 9-13 since September 2020.  Previously baseline was in the 8-9 range.  No signs of active bleeding.  Ferritin high and TIBC low on iron studies done 12/1. -Continue to monitor  Mild thrombocytopenia Likely related to chronic renal disease/ hypoalbuminemia.  -Continue to monitor   A. fib Currently in sinus rhythm.  On anticoagulation with Eliquis.  Severe protein calorie malnutrition Albumin 1.9. -Nutrition consult   Spoke with wife-- over the last 3 months patient has been doing less for himself and spending more and more time in bed.  Only new medication is hydralazine and recent increase in lasix.    Family Communication/Anticipated D/C date and plan/Code Status   DVT prophylaxis: eliquis Code Status: Full Code.  Family Communication: spoke with wife Disposition Plan:    Medical Consultants:    None.     Subjective:   Denies back pain or pain anywhere   Objective:  Vitals:   04/24/19 0730 04/24/19 1100 04/24/19 1157 04/24/19 1233  BP: (!) 144/62 129/71  134/71  Pulse: (!) 57 60  63  Resp: (!) 0 17  18  Temp:   97.8 F (36.6 C) 97.7 F (36.5 C)  TempSrc:   Oral Oral  SpO2: 100% 99%  100%  Weight:        Height:        Intake/Output Summary (Last 24 hours) at 04/24/2019 1337 Last data filed at 04/24/2019 0708 Gross per 24 hour  Intake 1912.68 ml  Output --  Net 1912.68 ml   Filed Weights   04/23/19 1745  Weight: 83 kg    Exam: In bed, pale, ill appearing Dry mucous membranes rrr B/l amputee of LE No increased work of breathing    Data Reviewed:   I have personally reviewed following labs and imaging studies:  Labs: Labs show the following:   Basic Metabolic Panel: Recent Labs  Lab 04/23/19 1808 04/24/19 0520  NA 137 139  K 3.8 3.2*  CL 100 106  CO2 25 25  GLUCOSE 390* 259*  BUN 49* 48*  CREATININE 4.25* 3.96*  CALCIUM 8.1* 7.6*   GFR Estimated Creatinine Clearance: 20.4 mL/min (A) (by C-G formula based on SCr of 3.96 mg/dL (H)). Liver Function Tests: Recent Labs  Lab 04/23/19 1808  AST 24  ALT 12  ALKPHOS 78  BILITOT 0.9  PROT 3.9*  ALBUMIN 1.9*    Recent Labs  Lab 04/23/19 1808  LIPASE 14   No results for input(s): AMMONIA in the last 168 hours. Coagulation profile No results for input(s): INR, PROTIME in the last 168 hours.  CBC: Recent Labs  Lab 04/20/19 0953 04/20/19 1025 04/23/19 1808 04/24/19 0520  WBC  --   --  21.5* 17.9*  NEUTROABS  --   --  19.7*  --   HGB 13.9 10.5* 8.4* 8.7*  HCT  --   --  27.1* 28.1*  MCV  --   --  92.8 93.4  PLT  --   --  112* 98*   Cardiac Enzymes: No results for input(s): CKTOTAL, CKMB, CKMBINDEX, TROPONINI in the last 168 hours. BNP (last 3 results) No results for input(s): PROBNP in the last 8760 hours. CBG: Recent Labs  Lab 04/24/19 0021 04/24/19 0248 04/24/19 0830 04/24/19 1236  GLUCAP 335* 266* 205* 138*   D-Dimer: No results for input(s): DDIMER in the last 72 hours. Hgb A1c: Recent Labs    04/24/19 0520  HGBA1C 8.1*   Lipid Profile: No results for input(s): CHOL, HDL, LDLCALC, TRIG, CHOLHDL, LDLDIRECT in the last 72 hours. Thyroid function studies: No results for input(s):  TSH, T4TOTAL, T3FREE, THYROIDAB in the last 72 hours.  Invalid input(s): FREET3 Anemia work up: No results for input(s): VITAMINB12, FOLATE, FERRITIN, TIBC, IRON, RETICCTPCT in the last 72 hours. Sepsis Labs: Recent Labs  Lab 04/23/19 1808 04/23/19 2353 04/24/19 0520  PROCALCITON  --   --  57.32  WBC 21.5*  --  17.9*  LATICACIDVEN  --  1.1  --     Microbiology Recent Results (from the past 240 hour(s))  SARS CORONAVIRUS 2 (TAT 6-24 HRS) Nasopharyngeal Nasopharyngeal Swab     Status: None   Collection Time: 04/23/19 11:53 PM   Specimen: Nasopharyngeal Swab  Result Value Ref Range Status   SARS Coronavirus 2 NEGATIVE NEGATIVE Final    Comment: (NOTE) SARS-CoV-2 target nucleic acids are NOT DETECTED. The SARS-CoV-2 RNA is generally detectable in  upper and lower respiratory specimens during the acute phase of infection. Negative results do not preclude SARS-CoV-2 infection, do not rule out co-infections with other pathogens, and should not be used as the sole basis for treatment or other patient management decisions. Negative results must be combined with clinical observations, patient history, and epidemiological information. The expected result is Negative. Fact Sheet for Patients: SugarRoll.be Fact Sheet for Healthcare Providers: https://www.woods-mathews.com/ This test is not yet approved or cleared by the [place] FDA and  has been authorized for detection and/or diagnosis of SARS-CoV-2 by FDA under an Emergency Use Authorization (EUA). This EUA will remain  in effect (meaning this test can be used) for the duration of the COVID-19 declaration under Section 56 4(b)(1) of the Act, 21 U.S.C. section 360bbb-3(b)(1), unless the authorization is terminated or revoked sooner. Performed at [facility], [place] [address] [place], [place] [address]     Procedures and diagnostic studies:  Ct Abdomen Pelvis Wo  Contrast  Result Date: 04/23/2019 CLINICAL DATA:  Acute generalized abdominal pain, history end-stage renal disease on dialysis, type I diabetes mellitus, GERD, hypertension, atrial fibrillation EXAM: CT ABDOMEN AND PELVIS WITHOUT CONTRAST TECHNIQUE: Multidetector CT imaging of the abdomen and pelvis was performed following the standard protocol without IV contrast. Sagittal and coronal MPR images reconstructed from axial data set. No oral contrast. COMPARISON:  02/15/2019 FINDINGS: Lower chest: Small LEFT pleural effusion. Dependent bibasilar atelectasis. Minimal pericardial fluid. Hepatobiliary: Fatty infiltration of liver. Gallbladder surgically absent. Chronic pneumobilia. No definite hepatic mass. Pancreas: Atrophic Spleen: Normal appearance Adrenals/Urinary Tract: Adrenal glands normal appearance. Atrophic native kidneys containing calculi and renal vascular calcifications. Transplant kidney RIGHT iliac fossa. No evidence of renal mass or hydronephrosis. Bladder well distended. Stomach/Bowel: Fluid within stomach with questionable gastric wall thickening versus artifact from underdistention. Large and small bowel loops unremarkable. Vascular/Lymphatic: Extensive atherosclerotic calcifications of both large and small vessels. Aorta normal caliber. Multiple pelvic phleboliths. No adenopathy. Reproductive: Mild prostatic enlargement gland 4.6 x 3.9 cm. Other: Significant ascites. Scattered soft tissue edema. No free air or hernia. Musculoskeletal: Osseous demineralization. IMPRESSION: Scattered ascites and soft tissue edema, nonspecific. Small LEFT pleural effusion with bibasilar atelectasis. Fatty infiltration of liver. Transplant kidney RIGHT iliac fossa without evidence of mass or hydronephrosis. Mild prostatic enlargement. Chronic pneumobilia suspect from prior biliary intervention. No other acute intra-abdominal or intrapelvic abnormalities. Aortic Atherosclerosis (ICD10-I70.0). Electronically Signed   By:  Lavonia Dana M.D.   On: 04/23/2019 21:00   US Renal  Result Date: 04/24/2019 CLINICAL DATA:  Initial evaluation for elevated BUN and creatinine, acute renal injury. EXAM: RENAL / URINARY TRACT ULTRASOUND COMPLETE COMPARISON:  Prior CT from 04/23/2019. FINDINGS: Right Kidney: Renal measurements: 8.0 x 2.6 x 4.0 cm = volume: 42.7 mL. Right kidney atrophic in appearance with diffuse cortical thinning and increased echogenicity within the renal parenchyma. 9 mm shadowing stone present at the upper pole. No hydronephrosis. No focal renal mass. Left Kidney: Renal measurements: 8.6 x 5.0 x 5.3 cm = volume: 116.0 mL. Left kidney atrophic in appearance with diffuse cortical thinning and increased echogenicity within the renal parenchyma. No visible nephrolithiasis or hydronephrosis. No focal renal mass. Bladder: Appears normal for degree of bladder distention. Prostate measures 4.5 x 2.5 x 4.8 cm. Other: Small volume ascites noted within the visualized abdomen. Increased echogenicity noted within the visualized liver, suggesting steatosis. IMPRESSION: 1. Renal atrophy with diffuse cortical thinning and increased echogenicity within the renal parenchyma, compatible with chronic medical renal disease. 2. No  hydronephrosis. 3. 9 mm nonobstructive right renal nephrolithiasis. 4. Small volume ascites. 5. Hepatic steatosis. Electronically Signed   By: Jeannine Boga M.D.   On: 04/24/2019 04:27   Dg Chest Port 1 View  Result Date: 04/23/2019 CLINICAL DATA:  Fever EXAM: PORTABLE CHEST 1 VIEW COMPARISON:  None. FINDINGS: The heart size and mediastinal contours are within normal limits. Both lungs are clear. The visualized skeletal structures are unremarkable. IMPRESSION: No active disease. Electronically Signed   By: Kerby Moors M.D.   On: 04/23/2019 20:15    Medications:    apixaban  5 mg Oral BID   aspirin EC  81 mg Oral Daily   [START ON 04/26/2019] atorvastatin  10 mg Oral Q M,W,F   calcitRIOL  0.25 mcg  Oral Daily   calcium acetate  1,334 mg Oral TID WC   cholecalciferol  4,000 Units Oral Daily   hydrALAZINE  50 mg Oral BID   insulin aspart  0-5 Units Subcutaneous QHS   insulin aspart  0-9 Units Subcutaneous TID WC   insulin glargine  10 Units Subcutaneous Daily   magnesium chloride  2 tablet Oral BID   pantoprazole  40 mg Oral Daily   [START ON 04/25/2019] predniSONE  5 mg Oral Q breakfast   sodium bicarbonate  1,950 mg Oral QID   tacrolimus  3 mg Oral Q12H   tamsulosin  0.4 mg Oral QPC supper   Continuous Infusions:  sodium chloride 125 mL/hr (04/24/19 0244)   ceFEPime (MAXIPIME) IV     metronidazole Stopped (04/24/19 0708)   [START ON 04/25/2019] vancomycin       LOS: 0 days   Geradine Girt  [facility]   How to contact the [facility] Attending or Consulting provider [place] or covering provider during after hours [place], for this patient?  1. Check the care team in [facility] and look for a) attending/consulting [facility] provider listed and b) the [facility] team listed 2. Log into [email or web address] and use [facility] universal password to access. If you do not have the password, please contact the hospital operator. 3. Locate the [facility] provider you are looking for under [facility] and page to a number that you can be directly reached. 4. If you still have difficulty reaching the provider, please page the [facility] (Director on Call) for the Hospitalists listed on amion for assistance.  04/24/2019, 1:37 PM

## 2019-04-24 NOTE — ED Notes (Signed)
PUI/MS ordered bfast

## 2019-04-24 NOTE — Consult Note (Addendum)
Reason for Consult: Acute kidney injury on chronic kidney disease stage IV T Referring Physician: [name], DO [facility])  HPI:  63 year old Caucasian man with an incredibly complex past medical history not limited to history of calciphylaxis of the lower extremities status post bilateral below-knee amputations, end-stage renal disease status post deceased donor kidney transplant 4 years ago (January, 2016) with gradually declining renal allograft function (recent creatinine around 3.4), history of duodenal adenocarcinoma status post resection, insulin-dependent diabetes mellitus with associated neuropathy and recent problems with volume overload requiring adjustment of diuretic therapy.  He was admitted to the hospital yesterday with a 4 to 5-day history of progressively worsening malaise/fatigue with nausea, retching and low-grade fever of 100.3.  He denies any dysuria, urgency, frequency, allograft site pain, hematuria, cough, sputum production or shortness of breath.  He denies any focal wounds.  He reports good compliance with his immunosuppressive management and has been struggling to keep up with his fluid intake due to nausea.  In the past several weeks, he has been having increasing lassitude, frequent falls, forgetfulness and unsteadiness on his prosthesis.  He has had to call EMS and neighbors to help him off the floor after his falls because of inability to get up.  A question is raised as to whether this represents progressive uremia.  On admission, creatinine was higher than his baseline at 4.2 and overnight has improved slightly to 3.9 after holding diuretics.  He was started on broad-spectrum antimicrobial coverage with vancomycin, metronidazole and cefepime with pancultures pending.  Radiological studies have been unyielding.  Past Medical History:  Diagnosis Date  . A-fib ([place])   . Anemia    esrd  . Calciphylaxis 05/2013   lower ext  . Cancer ([place])    hx duodenal adenoCa 2002,  resected  . Closed left arm fracture 1967  . Corneal abrasion, left   . Depression   . Diabetes mellitus    Type 1 iddm x 11 yrs  . Diabetic neuropathy ([place])   . ESRD on hemodialysis ([place])    Pt has ESRD due to DM.  He had a L upper arm AVF prior to starting HD which was ligated due to L arm steal syndrome.  He started HD in Jan 2014 and did home HD.  The family couldn't cannulate the R arm AVF successfully so by mid 2014 they decided to switch to PD which was done late summer 2014.  In Jan 2015 he was admitted with FTT felt to be due to underdialysis on PD and PD was abandoned and he   . GERD (gastroesophageal reflux disease)   . Glomerulosclerosis, diabetic ([place])   . Heart murmur   . History of blood transfusion   . Hypertension    off of meds due to orthostatic hypotension  . Kidney transplant recipient   . Open wound of both legs with complication    Pt has had progressive wounds of both LE's including gangrene of the toes and patchy necrosis of the calves.  He has been treated at [facility] by Dr Jerline Pain with hyperbaric O2 5d per week.  He is getting Na Thiosulfate with HD for suspected calciphylaxis. Pt says doctor's aren't sure if these ulcers were diabetic ulcers or calciphylaxis.  He underwent bilat BKA on 07/30/13.  He says that the woun    Past Surgical History:  Procedure Laterality Date  . AMPUTATION Bilateral 07/30/2013   Procedure: AMPUTATION BELOW KNEE;  Surgeon: Newt Minion, MD;  Location: [place];  Service: Orthopedics;  Laterality: Bilateral;  Bilateral Below Knee Amputations  . AMPUTATION Bilateral 09/03/2013   Procedure: AMPUTATION BELOW KNEE;  Surgeon: Newt Minion, MD;  Location: [place];  Service: Orthopedics;  Laterality: Bilateral;  Bilateral Below Knee Amputation Revision  . AV FISTULA PLACEMENT  06/07/2011   Procedure: ARTERIOVENOUS (AV) FISTULA CREATION;  Surgeon: Angelia Mould, MD;  Location: [place];  Service: Vascular;  Laterality: Left;  . AV FISTULA  PLACEMENT  06/18/2012   Procedure: ARTERIOVENOUS (AV) FISTULA CREATION;  Surgeon: Mal Misty, MD;  Location: [place];  Service: Vascular;  Laterality: Right;  [place]  . BILIARY DIVERSION, EXTERNAL  2002  . BONE MARROW BIOPSY  2012  . CAPD INSERTION N/A 01/14/2013   Procedure: LAPAROSCOPIC INSERTION CONTINUOUS AMBULATORY PERITONEAL DIALYSIS  (CAPD) CATHETER;  Surgeon: Adin Hector, MD;  Location: [place];  Service: General;  Laterality: N/A;  . CAPD REMOVAL N/A 07/09/2013   Procedure: CONTINUOUS AMBULATORY PERITONEAL DIALYSIS  (CAPD) CATHETER REMOVAL;  Surgeon: Adin Hector, MD;  Location: [place];  Service: General;  Laterality: N/A;  . COLONOSCOPY     Hx: of  . INSERTION OF DIALYSIS CATHETER  05/22/2012   Procedure: INSERTION OF DIALYSIS CATHETER;  Surgeon: Mal Misty, MD;  Location: [place];  Service: Vascular;  Laterality: N/A;  right internal jugular vein  . IR ANGIO INTRA EXTRACRAN SEL COM CAROTID INNOMINATE BILAT MOD SED  10/28/2018  . IR ANGIO VERTEBRAL SEL SUBCLAVIAN INNOMINATE UNI R MOD SED  10/28/2018  . IR ANGIO VERTEBRAL SEL VERTEBRAL UNI L MOD SED  10/28/2018  . LIGATION OF ARTERIOVENOUS  FISTULA  05/22/2012   Procedure: LIGATION OF ARTERIOVENOUS  FISTULA;  Surgeon: Mal Misty, MD;  Location: [place];  Service: Vascular;  Laterality: Left;  Left brachio-cephalic fistula  . LOWER EXTREMITY ANGIOGRAM N/A 06/14/2013   Procedure: LOWER EXTREMITY ANGIOGRAM;  Surgeon: Angelia Mould, MD;  Location: [facility] CATH LAB;  Service: Cardiovascular;  Laterality: N/A;  . NEPHRECTOMY TRANSPLANTED ORGAN    . OTHER SURGICAL HISTORY     Cyst removed from back  . PORTACATH PLACEMENT  2002  . RADIOLOGY WITH ANESTHESIA N/A 10/28/2018   Procedure: IR WITH ANESTHESIA;  Surgeon: Radiologist, Medication, MD;  Location: [place];  Service: Radiology;  Laterality: N/A;  . REMOVAL OF A DIALYSIS CATHETER    . RENAL BIOPSY, PERCUTANEOUS  2012  . TONSILLECTOMY    . VASCULAR SURGERY    . WHIPPLE PROCEDURE  2002    duodenal ca, Dr. Harlow Asa    Family History  Problem Relation Age of Onset  . Cancer Mother   . Cancer Father        prostate cancer  . Heart disease Maternal Grandfather   . Stroke Paternal Grandmother     Social History:  reports that he has never smoked. He has never used smokeless tobacco. He reports that he does not drink alcohol or use drugs.  Allergies:  Allergies  Allergen Reactions  . Metformin And Related Other (See Comments)    Has kidney problems    Medications:  Scheduled: . apixaban  5 mg Oral BID  . aspirin EC  81 mg Oral Daily  . [START ON 04/26/2019] atorvastatin  10 mg Oral Q M,W,F  . calcitRIOL  0.25 mcg Oral Daily  . calcium acetate  1,334 mg Oral TID WC  . cholecalciferol  4,000 Units Oral Daily  . insulin aspart  0-5 Units Subcutaneous QHS  . insulin aspart  0-9 Units Subcutaneous TID WC  . insulin glargine  10 Units Subcutaneous Daily  . magnesium chloride  2 tablet Oral BID  . pantoprazole  40 mg Oral Daily  . [START ON 04/25/2019] predniSONE  5 mg Oral Q breakfast  . sodium bicarbonate  1,950 mg Oral QID  . tacrolimus  3 mg Oral Q12H  . tamsulosin  0.4 mg Oral QPC supper    BMP Latest Ref Rng & Units 04/24/2019 04/23/2019 11/04/2018  Glucose 70 - 99 mg/dL 259(H) 390(H) 256(H)  BUN 8 - 23 mg/dL 48(H) 49(H) 53(H)  Creatinine 0.61 - 1.24 mg/dL 3.96(H) 4.25(H) 3.68(H)  Sodium 135 - 145 mmol/L 139 137 137  Potassium 3.5 - 5.1 mmol/L 3.2(L) 3.8 4.4  Chloride 98 - 111 mmol/L 106 100 107  CO2 22 - 32 mmol/L '25 25 23  '$ Calcium 8.9 - 10.3 mg/dL 7.6(L) 8.1(L) 8.0(L)   CBC Latest Ref Rng & Units 04/24/2019 04/23/2019 04/20/2019  WBC 4.0 - 10.5 K/uL 17.9(H) 21.5(H) -  Hemoglobin 13.0 - 17.0 g/dL 8.7(L) 8.4(L) 10.5(L)  Hematocrit 39.0 - 52.0 % 28.1(L) 27.1(L) -  Platelets 150 - 400 K/uL 98(L) 112(L) -     Ct Abdomen Pelvis Wo Contrast  Result Date: 04/23/2019 CLINICAL DATA:  Acute generalized abdominal pain, history end-stage renal disease on dialysis,  type I diabetes mellitus, GERD, hypertension, atrial fibrillation EXAM: CT ABDOMEN AND PELVIS WITHOUT CONTRAST TECHNIQUE: Multidetector CT imaging of the abdomen and pelvis was performed following the standard protocol without IV contrast. Sagittal and coronal MPR images reconstructed from axial data set. No oral contrast. COMPARISON:  02/15/2019 FINDINGS: Lower chest: Small LEFT pleural effusion. Dependent bibasilar atelectasis. Minimal pericardial fluid. Hepatobiliary: Fatty infiltration of liver. Gallbladder surgically absent. Chronic pneumobilia. No definite hepatic mass. Pancreas: Atrophic Spleen: Normal appearance Adrenals/Urinary Tract: Adrenal glands normal appearance. Atrophic native kidneys containing calculi and renal vascular calcifications. Transplant kidney RIGHT iliac fossa. No evidence of renal mass or hydronephrosis. Bladder well distended. Stomach/Bowel: Fluid within stomach with questionable gastric wall thickening versus artifact from underdistention. Large and small bowel loops unremarkable. Vascular/Lymphatic: Extensive atherosclerotic calcifications of both large and small vessels. Aorta normal caliber. Multiple pelvic phleboliths. No adenopathy. Reproductive: Mild prostatic enlargement gland 4.6 x 3.9 cm. Other: Significant ascites. Scattered soft tissue edema. No free air or hernia. Musculoskeletal: Osseous demineralization. IMPRESSION: Scattered ascites and soft tissue edema, nonspecific. Small LEFT pleural effusion with bibasilar atelectasis. Fatty infiltration of liver. Transplant kidney RIGHT iliac fossa without evidence of mass or hydronephrosis. Mild prostatic enlargement. Chronic pneumobilia suspect from prior biliary intervention. No other acute intra-abdominal or intrapelvic abnormalities. Aortic Atherosclerosis (ICD10-I70.0). Electronically Signed   By: Lavonia Dana M.D.   On: 04/23/2019 21:00   US Renal  Result Date: 04/24/2019 CLINICAL DATA:  Initial evaluation for elevated  BUN and creatinine, acute renal injury. EXAM: RENAL / URINARY TRACT ULTRASOUND COMPLETE COMPARISON:  Prior CT from 04/23/2019. FINDINGS: Right Kidney: Renal measurements: 8.0 x 2.6 x 4.0 cm = volume: 42.7 mL. Right kidney atrophic in appearance with diffuse cortical thinning and increased echogenicity within the renal parenchyma. 9 mm shadowing stone present at the upper pole. No hydronephrosis. No focal renal mass. Left Kidney: Renal measurements: 8.6 x 5.0 x 5.3 cm = volume: 116.0 mL. Left kidney atrophic in appearance with diffuse cortical thinning and increased echogenicity within the renal parenchyma. No visible nephrolithiasis or hydronephrosis. No focal renal mass. Bladder: Appears normal for degree of bladder distention. Prostate measures 4.5 x 2.5 x 4.8 cm. Other:  Small volume ascites noted within the visualized abdomen. Increased echogenicity noted within the visualized liver, suggesting steatosis. IMPRESSION: 1. Renal atrophy with diffuse cortical thinning and increased echogenicity within the renal parenchyma, compatible with chronic medical renal disease. 2. No hydronephrosis. 3. 9 mm nonobstructive right renal nephrolithiasis. 4. Small volume ascites. 5. Hepatic steatosis. Electronically Signed   By: Jeannine Boga M.D.   On: 04/24/2019 04:27   Dg Chest Port 1 View  Result Date: 04/23/2019 CLINICAL DATA:  Fever EXAM: PORTABLE CHEST 1 VIEW COMPARISON:  None. FINDINGS: The heart size and mediastinal contours are within normal limits. Both lungs are clear. The visualized skeletal structures are unremarkable. IMPRESSION: No active disease. Electronically Signed   By: Kerby Moors M.D.   On: 04/23/2019 20:15    Review of Systems  Constitutional: Positive for fever and malaise/fatigue. Negative for chills and diaphoresis.  HENT: Negative.   Eyes: Negative.   Respiratory: Negative.   Cardiovascular: Positive for leg swelling. Negative for chest pain, palpitations and orthopnea.        Swelling of BKA stumps  Gastrointestinal: Positive for nausea. Negative for abdominal pain, blood in stool, diarrhea and vomiting.  Genitourinary: Negative.   Musculoskeletal: Positive for falls and myalgias.  Skin: Negative.   Neurological: Positive for weakness.   Blood pressure 134/71, pulse 63, temperature 97.7 F (36.5 C), temperature source Oral, resp. rate 18, height 6' (1.829 m), weight 83 kg, SpO2 100 %. Physical Exam  Nursing note and vitals reviewed. Constitutional: He is oriented to person, place, and time. He appears well-developed and well-nourished.  HENT:  Head: Normocephalic and atraumatic.  Mouth/Throat: Oropharynx is clear and moist.  Eyes: Pupils are equal, round, and reactive to light. Conjunctivae and EOM are normal. No scleral icterus.  Neck: Normal range of motion. Neck supple. No JVD present.  Cardiovascular: Normal rate, regular rhythm and normal heart sounds.  No murmur heard. Respiratory: Effort normal and breath sounds normal. He has no wheezes. He has no rales.  GI: Soft. Bowel sounds are normal. There is no abdominal tenderness. There is no rebound and no guarding.  Musculoskeletal:        General: No edema.     Comments: Status post bilateral below-knee amputation.  Right radiocephalic fistula with poor thrill and low pitched bruit.  Suboptimal augmentation.  Neurological: He is alert and oriented to person, place, and time.  Skin: Skin is warm. No rash noted.    Assessment/Plan: 1.  Fever, leukocytosis and deconditioning: Raising concern for infectious etiology.  He has been started on broad-spectrum antimicrobial coverage with pancultures pending.  I will add CMV PCR and EBV PCR and consider expansion for adenovirus screening if continues to have unexplained fever.  Based on my assessment, he is recent deconditioning and nausea is not from severe azotemia prompting need to restart renal replacement therapy with dialysis. 2.  Acute kidney injury on  chronic kidney disease stage IV T: With evidence of progressive allograft nephropathy following DGF post transplant and evidence of IFTA from CNI based on allograft biopsy.  Renal function improving slowly with diuretics currently on hold (volume status permissive at this point).  We will continue to follow on immunosuppressive therapy with tacrolimus and prednisone.  I will confirm mycophenolate dosing from outpatient records (this does not need to be stopped unless in the scenario of sepsis). 3.  Nausea/vomiting: Likely associated with febrile illness, will check CMV PCR. 4.  Anemia of chronic disease: Reports recent (12/2) Aranesp administration.  We will  follow up on iron studies. 5.  Hypokalemia: Mild and likely from total body losses in the setting of ongoing diuretic use, will give oral potassium supplement.  Cyani Kallstrom K. 04/24/2019, 3:21 PM

## 2019-04-24 NOTE — ED Notes (Signed)
Patient transported to Ultrasound 

## 2019-04-25 DIAGNOSIS — N179 Acute kidney failure, unspecified: Secondary | ICD-10-CM

## 2019-04-25 LAB — GLUCOSE, CAPILLARY
Glucose-Capillary: 88 mg/dL (ref 70–99)
Glucose-Capillary: 77 mg/dL (ref 70–99)
Glucose-Capillary: 211 mg/dL — ABNORMAL HIGH (ref 70–99)
Glucose-Capillary: 241 mg/dL — ABNORMAL HIGH (ref 70–99)

## 2019-04-25 LAB — CBC WITH DIFFERENTIAL/PLATELET
Abs Immature Granulocytes: 0.07 10*3/uL (ref 0.00–0.07)
Basophils Absolute: 0 10*3/uL (ref 0.0–0.1)
Basophils Relative: 0 %
Eosinophils Absolute: 0.1 10*3/uL (ref 0.0–0.5)
Eosinophils Relative: 1 %
HCT: 27.5 % — ABNORMAL LOW (ref 39.0–52.0)
Hemoglobin: 8.5 g/dL — ABNORMAL LOW (ref 13.0–17.0)
Immature Granulocytes: 1 %
Lymphocytes Relative: 17 %
Lymphs Abs: 1.9 10*3/uL (ref 0.7–4.0)
MCH: 28.9 pg (ref 26.0–34.0)
MCHC: 30.9 g/dL (ref 30.0–36.0)
MCV: 93.5 fL (ref 80.0–100.0)
Monocytes Absolute: 0.7 10*3/uL (ref 0.1–1.0)
Monocytes Relative: 6 %
Neutro Abs: 8.7 10*3/uL — ABNORMAL HIGH (ref 1.7–7.7)
Neutrophils Relative %: 75 %
Platelets: 87 10*3/uL — ABNORMAL LOW (ref 150–400)
RBC: 2.94 MIL/uL — ABNORMAL LOW (ref 4.22–5.81)
RDW: 14.2 % (ref 11.5–15.5)
WBC: 11.5 10*3/uL — ABNORMAL HIGH (ref 4.0–10.5)
nRBC: 0 % (ref 0.0–0.2)

## 2019-04-25 LAB — COMPREHENSIVE METABOLIC PANEL
ALT: 9 U/L (ref 0–44)
AST: 11 U/L — ABNORMAL LOW (ref 15–41)
Albumin: 1.6 g/dL — ABNORMAL LOW (ref 3.5–5.0)
Alkaline Phosphatase: 61 U/L (ref 38–126)
Anion gap: 8 (ref 5–15)
BUN: 49 mg/dL — ABNORMAL HIGH (ref 8–23)
CO2: 26 mmol/L (ref 22–32)
Calcium: 7.7 mg/dL — ABNORMAL LOW (ref 8.9–10.3)
Chloride: 107 mmol/L (ref 98–111)
Creatinine, Ser: 3.94 mg/dL — ABNORMAL HIGH (ref 0.61–1.24)
GFR calc Af Amer: 17 mL/min — ABNORMAL LOW (ref >60)
GFR calc non Af Amer: 15 mL/min — ABNORMAL LOW (ref >60)
Glucose, Bld: 129 mg/dL — ABNORMAL HIGH (ref 70–99)
Potassium: 3.1 mmol/L — ABNORMAL LOW (ref 3.5–5.1)
Sodium: 141 mmol/L (ref 135–145)
Total Bilirubin: 0.5 mg/dL (ref 0.3–1.2)
Total Protein: 3.5 g/dL — ABNORMAL LOW (ref 6.5–8.1)

## 2019-04-25 MED ORDER — FLUOXETINE HCL 10 MG PO CAPS
10.0000 mg | ORAL_CAPSULE | Freq: Every day | ORAL | Status: DC
  Filled 2019-04-25: qty 1

## 2019-04-25 MED ORDER — MAGNESIUM SULFATE IN D5W 1-5 GM/100ML-% IV SOLN
1.0000 g | Freq: Once | INTRAVENOUS | Status: AC
  Administered 2019-04-25: 1 g via INTRAVENOUS
  Filled 2019-04-25: qty 100

## 2019-04-25 MED ORDER — MYCOPHENOLATE SODIUM 180 MG PO TBEC
360.0000 mg | DELAYED_RELEASE_TABLET | Freq: Two times a day (BID) | ORAL | Status: DC
  Administered 2019-04-25 – 2019-04-29 (×9): 360 mg via ORAL
  Filled 2019-04-25 (×11): qty 2

## 2019-04-25 NOTE — Evaluation (Signed)
Physical Therapy Evaluation Patient Details Name: Paul [name] MRN: [id number] DOB: [birth year] Today's Date: 04/25/2019   History of Present Illness  63 y.o. male with medical history significant of renal transplant, CKD stage IV, calciphylaxis, anemia of chronic disease, A. Fib, duodenal adenocarcinoma status post resection, insulin-dependent diabetes mellitus, diabetic neuropathy, status post bilateral BKA, hypertension presenting to the ED for evaluation of fever, nausea/vomiting, and hyperglycemia.  Clinical Impression  Pt demonstrates deficits in gait, balance, strength, power, endurance, and functional mobility compared to baseline. Pt is able to ambulate short household distances with use of RW, however he has had a recent decline in function and was recently able to ambulate community distances independently with use of cane. Pt will benefit from continued acute PT services to improve gait quality, LE strength/power and balance, to aide in return to ambulating with cane.    Follow Up Recommendations CIR;Supervision/Assistance - 24 hour    Equipment Recommendations  None recommended by PT(pt owns necessary DME)    Recommendations for Other Services OT consult;Rehab consult     Precautions / Restrictions Precautions Precautions: Fall Required Braces or Orthoses: (bilateral prostheses) Restrictions Weight Bearing Restrictions: No      Mobility  Bed Mobility Overal bed mobility: Independent                Transfers Overall transfer level: Needs assistance Equipment used: Rolling walker (2 wheeled) Transfers: Sit to/from Stand Sit to Stand: Min guard            Ambulation/Gait Ambulation/Gait assistance: Min guard Gait Distance (Feet): 70 Feet Assistive device: Rolling walker (2 wheeled) Gait Pattern/deviations: Step-through pattern;Decreased step length - right;Decreased step length - left Gait velocity: reduced Gait velocity interpretation: <1.8 ft/sec,  indicate of risk for recurrent falls General Gait Details: pt with step through gait, reduced stride length, slowed gait speed, dependent on BUE support  Stairs            Wheelchair Mobility    Modified Rankin (Stroke Patients Only)       Balance Overall balance assessment: Needs assistance Sitting-balance support: No upper extremity supported;Feet unsupported Sitting balance-Leahy Scale: Normal     Standing balance support: Bilateral upper extremity supported Standing balance-Leahy Scale: Good Standing balance comment: close supervision                             Pertinent Vitals/Pain Pain Assessment: No/denies pain    Home Living Family/patient expects to be discharged to:: Private residence(pt would like to D/C to CIR) Living Arrangements: Spouse/significant other Available Help at Discharge: Family Type of Home: Apartment Home Access: Ramped entrance     Home Layout: Able to live on main level with bedroom/bathroom Home Equipment: Cane - single point;Shower seat;Wheelchair - Water quality scientist - 2 wheels      Prior Function Level of Independence: Independent with assistive device(s)         Comments: independent with cane until just before thanksgiving, now dependent on RW     Hand Dominance   Dominant Hand: Right    Extremity/Trunk Assessment   Upper Extremity Assessment Upper Extremity Assessment: Overall WFL for tasks assessed    Lower Extremity Assessment Lower Extremity Assessment: Generalized weakness(grossly 4/5 BLE)    Cervical / Trunk Assessment Cervical / Trunk Assessment: Normal  Communication   Communication: No difficulties  Cognition Arousal/Alertness: Awake/alert Behavior During Therapy: WFL for tasks assessed/performed Overall Cognitive Status: Within Functional Limits for tasks assessed  General Comments General comments (skin integrity, edema,  etc.): VSS    Exercises     Assessment/Plan    PT Assessment Patient needs continued PT services  PT Problem List Decreased strength;Decreased activity tolerance;Decreased balance;Decreased mobility;Decreased knowledge of use of DME       PT Treatment Interventions DME instruction;Gait training;Functional mobility training;Therapeutic activities;Therapeutic exercise;Balance training;Neuromuscular re-education;Patient/family education    PT Goals (Current goals can be found in the Care Plan section)  Acute Rehab PT Goals Patient Stated Goal: To go to CIR and get stronger to walk with cane again PT Goal Formulation: With patient Time For Goal Achievement: 05/09/19 Potential to Achieve Goals: Good Additional Goals Additional Goal #1: Pt will maintain dynamic standing balance within 10 inches of his base of support with use of cane, modI.    Frequency Min 3X/week   Barriers to discharge        Co-evaluation               AM-PAC PT "6 Clicks" Mobility  Outcome Measure Help needed turning from your back to your side while in a flat bed without using bedrails?: None Help needed moving from lying on your back to sitting on the side of a flat bed without using bedrails?: None Help needed moving to and from a bed to a chair (including a wheelchair)?: A Little Help needed standing up from a chair using your arms (e.g., wheelchair or bedside chair)?: A Little Help needed to walk in hospital room?: A Little Help needed climbing 3-5 steps with a railing? : A Lot 6 Click Score: 19    End of Session   Activity Tolerance: Patient tolerated treatment well Patient left: in bed;with call bell/phone within reach Nurse Communication: Mobility status PT Visit Diagnosis: Other abnormalities of gait and mobility (R26.89);Muscle weakness (generalized) (M62.81)    Time: 8502-7741 PT Time Calculation (min) (ACUTE ONLY): 26 min   Charges:   PT Evaluation $PT Eval Moderate Complexity: 1  Mod          Zenaida Niece, PT, DPT Acute Rehabilitation Pager: [phone]   Zenaida Niece 04/25/2019, 5:40 PM

## 2019-04-25 NOTE — Plan of Care (Signed)

## 2019-04-25 NOTE — Plan of Care (Signed)

## 2019-04-25 NOTE — Progress Notes (Signed)
Patient ID: Paul [name], male   DOB: [birth year], 63 y.o.   MRN: [id number] [facility] Progress Note   Assessment/ Plan:   1.  Fever, leukocytosis and deconditioning: Afebrile since admission on broad-spectrum antimicrobial coverage.  No definitive focus of infection and culture data negative to date.  OI viral studies pending.  Will restart mycophenolate as he does not appear septic.  Plans in place for PT/OT evaluation for possible CIR admission. 2.  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  Renal function essentially unchanged overnight with decent urine output.  Immunosuppressive therapy resumed and will plan to restart diuretics tomorrow. 3.  Nausea/vomiting:  Clinically better. 4.  Anemia of chronic disease: Reports recent (12/2) Aranesp administration.    Iron stores replete. 5.  Hypokalemia:  Likely from total body deficit in the setting of recent diuretic use; continue oral supplementation.  Replace magnesium.  Subjective:   Denies any acute events overnight.  Does not feel like he is depressed and may be a little anxious about his work situation-declines pharmacological treatment.   Objective:   BP 122/79 (BP Location: Left Arm)   Pulse 64   Temp 98 F (36.7 C) (Oral)   Resp 18   Ht 6' (1.829 m)   Wt 83 kg   SpO2 100%   BMI 24.82 kg/m   Intake/Output Summary (Last 24 hours) at 04/25/2019 1100 Last data filed at 04/25/2019 1000 Gross per 24 hour  Intake 1080 ml  Output 1200 ml  Net -120 ml   Weight change:   Physical Exam: Gen: Comfortably resting in bed, eating yogurt CVS: Pulse regular rhythm, normal rate, S1 and S2 normal Resp: Diminished breath sounds over bases, no distinct rales or rhonchi Abd: Soft, obese, nontender Ext: 1+ pitting edema bilateral BKA stumps, trace-1+ dependent  edema  Imaging: Ct Abdomen Pelvis Wo Contrast  Result Date: 04/23/2019 CLINICAL DATA:  Acute generalized abdominal pain, history end-stage renal disease on dialysis, type I diabetes mellitus, GERD, hypertension, atrial fibrillation EXAM: CT ABDOMEN AND PELVIS WITHOUT CONTRAST TECHNIQUE: Multidetector CT imaging of the abdomen and pelvis was performed following the standard protocol without IV contrast. Sagittal and coronal MPR images reconstructed from axial data set. No oral contrast. COMPARISON:  02/15/2019 FINDINGS: Lower chest: Small LEFT pleural effusion. Dependent bibasilar atelectasis. Minimal pericardial fluid. Hepatobiliary: Fatty infiltration of liver. Gallbladder surgically absent. Chronic pneumobilia. No definite hepatic mass. Pancreas: Atrophic Spleen: Normal appearance Adrenals/Urinary Tract: Adrenal glands normal appearance. Atrophic native kidneys containing calculi and renal vascular calcifications. Transplant kidney RIGHT iliac fossa. No evidence of renal mass or hydronephrosis. Bladder well distended. Stomach/Bowel: Fluid within stomach with questionable gastric wall thickening versus artifact from underdistention. Large and small bowel loops unremarkable. Vascular/Lymphatic: Extensive atherosclerotic calcifications of both large and small vessels. Aorta normal caliber. Multiple pelvic phleboliths. No adenopathy. Reproductive: Mild prostatic enlargement gland 4.6 x 3.9 cm. Other: Significant ascites. Scattered soft tissue edema. No free air or hernia. Musculoskeletal: Osseous demineralization. IMPRESSION: Scattered ascites and soft tissue edema, nonspecific. Small LEFT pleural effusion with bibasilar atelectasis. Fatty infiltration of liver. Transplant kidney RIGHT iliac fossa without evidence of mass or hydronephrosis. Mild prostatic enlargement. Chronic pneumobilia suspect from prior biliary intervention. No other acute intra-abdominal or intrapelvic abnormalities. Aortic Atherosclerosis  (ICD10-I70.0). Electronically Signed   By: Lavonia Dana M.D.   On: 04/23/2019 21:00   US Renal  Result Date: 04/24/2019 CLINICAL DATA:  Initial evaluation for elevated BUN and creatinine, acute renal injury. EXAM: RENAL / URINARY TRACT ULTRASOUND COMPLETE COMPARISON:  Prior CT from 04/23/2019. FINDINGS: Right Kidney: Renal measurements: 8.0 x 2.6 x 4.0 cm = volume: 42.7 mL. Right kidney atrophic in appearance with diffuse cortical thinning and increased echogenicity within the renal parenchyma. 9 mm shadowing stone present at the upper pole. No hydronephrosis. No focal renal mass. Left Kidney: Renal measurements: 8.6 x 5.0 x 5.3 cm = volume: 116.0 mL. Left kidney atrophic in appearance with diffuse cortical thinning and increased echogenicity within the renal parenchyma. No visible nephrolithiasis or hydronephrosis. No focal renal mass. Bladder: Appears normal for degree of bladder distention. Prostate measures 4.5 x 2.5 x 4.8 cm. Other: Small volume ascites noted within the visualized abdomen. Increased echogenicity noted within the visualized liver, suggesting steatosis. IMPRESSION: 1. Renal atrophy with diffuse cortical thinning and increased echogenicity within the renal parenchyma, compatible with chronic medical renal disease. 2. No hydronephrosis. 3. 9 mm nonobstructive right renal nephrolithiasis. 4. Small volume ascites. 5. Hepatic steatosis. Electronically Signed   By: Jeannine Boga M.D.   On: 04/24/2019 04:27   Dg Chest Port 1 View  Result Date: 04/23/2019 CLINICAL DATA:  Fever EXAM: PORTABLE CHEST 1 VIEW COMPARISON:  None. FINDINGS: The heart size and mediastinal contours are within normal limits. Both lungs are clear. The visualized skeletal structures are unremarkable. IMPRESSION: No active disease. Electronically Signed   By: Kerby Moors M.D.   On: 04/23/2019 20:15    Labs: BMET Recent Labs  Lab 04/23/19 1808 04/24/19 0520 04/25/19 0342  NA 137 139 141  K 3.8 3.2* 3.1*  CL  100 106 107  CO2 25 25 26   GLUCOSE 390* 259* 129*  BUN 49* 48* 49*  CREATININE 4.25* 3.96* 3.94*  CALCIUM 8.1* 7.6* 7.7*   CBC Recent Labs  Lab 04/20/19 1025 04/23/19 1808 04/24/19 0520 04/25/19 0342  WBC  --  21.5* 17.9* 11.5*  NEUTROABS  --  19.7*  --  8.7*  HGB 10.5* 8.4* 8.7* 8.5*  HCT  --  27.1* 28.1* 27.5*  MCV  --  92.8 93.4 93.5  PLT  --  112* 98* 87*   Medications:    . apixaban  5 mg Oral BID  . aspirin EC  81 mg Oral Daily  . [START ON 04/26/2019] atorvastatin  10 mg Oral Q M,W,F  . B-complex with vitamin C  1 tablet Oral Daily  . calcitRIOL  0.25 mcg Oral Daily  . calcium acetate  1,334 mg Oral TID WC  . cholecalciferol  4,000 Units Oral Daily  . feeding supplement (GLUCERNA SHAKE)  237 mL Oral TID BM  . insulin aspart  0-5 Units Subcutaneous QHS  . insulin aspart  0-9 Units Subcutaneous TID WC  . insulin glargine  10 Units Subcutaneous Daily  . magnesium chloride  2 tablet Oral BID  . pantoprazole  40 mg Oral Daily  . potassium chloride  20 mEq Oral BID  . predniSONE  5 mg Oral Q breakfast  . sodium bicarbonate  1,950 mg Oral QID  . tacrolimus  3 mg Oral Q12H  . tamsulosin  0.4 mg Oral QPC supper   Elmarie Shiley, MD 04/25/2019, 11:00 AM

## 2019-04-25 NOTE — Progress Notes (Signed)
Progress Note    Paul [name]  ALP:[id number] DOB: 12-21-1955  DOA: 04/23/2019 PCP: [name], MD    Brief Narrative:     Medical records reviewed and are as summarized below:  Paul [name] is an 63 y.o. male with medical history significant of renal transplant, CKD stage IV, calciphylaxis, anemia of chronic disease, A. Fib, duodenal adenocarcinoma status post resection, insulin-dependent diabetes mellitus, diabetic neuropathy, status post bilateral BKA, hypertension presenting to the ED for evaluation of fever, nausea/vomiting, and hyperglycemia.  Patient states he has not been feeling well for the past few days.  Today he had a temperature of 100.3 F.   Assessment/Plan:   Principal Problem:   Fever Active Problems:   Diabetes mellitus, type 2 (HCC)   AKI (acute kidney injury) ([place])   Hyperglycemia   Nausea and vomiting    Fever and leukocytosis - possible infection; unknown source Patient presenting with complaints of a fever of 100.3 F.  No antipyretics taken at home and on vitals done in the ED he is afebrile.  Does have significant leukocytosis with WBC count 35.3 with neutrophilic predominance.  Lactic acid normal.  Hemodynamically stable. Chest x-ray not suggestive of pneumonia.  U/A negative CT abdomen pelvis without obvious infectious source - scattered ascites and soft tissue edema, non specific.  SBP less likely as no ascites or abdominal tenderness appreciated on exam. -Continue broad-spectrum antibiotics including vancomycin, cefepime, and metronidazole at this time. -Blood culture pending -trend WBCs -SARS-CoV-2 test negative -procalcitonin level markedly elevated but he is a transplant patient so not sure how to interpret -continue to monitor -CMV, epstein barr pending  AKI on CKD stage IV, history of renal transplant BUN 49, creatinine 4.2.  Creatinine ranging between 3.1-4.1 on labs done in June 2020.  Most recent prior lab reading was creatinine  3.6 on 6/17.  Suspect prerenal due to decreased p.o. intake/vomiting. -IV fluid hydration -Cr improved after hydration -defer resumption of lasix to renal  Hyperglycemia, insulin-dependent diabetes mellitus Patient has not been able to take insulin for the past few days as he has been vomiting and not feeling well.  -SSI -resume insulin  Nausea and vomiting -Lipase and LFTs normal.  Abdominal exam benign.  Abdominal CT without obvious source. -IV fluid hydration -Zofran as needed -? Viral etiology?  Anemia of chronic renal disease Hemoglobin 8.4, ranging between 9-13 since September 2020.  Previously baseline was in the 8-9 range.  No signs of active bleeding.  Ferritin high and TIBC low on iron studies done 12/1. -Continue to monitor  Mild thrombocytopenia Likely related to chronic renal disease/ hypoalbuminemia.  -Continue to monitor   A. fib Currently in sinus rhythm.  On anticoagulation with Eliquis.  Severe protein calorie malnutrition Albumin 1.9. -Nutrition consult  Hypomagnesemia/hypokalemia -replete  Spoke with wife-- over the last 3 months patient has been doing less for himself and spending more and more time in bed.  Only new medication is hydralazine and recent increase in lasix.    Family Communication/Anticipated D/C date and plan/Code Status   DVT prophylaxis: eliquis Code Status: Full Code.  Family Communication: spoke with wife 12/5 Disposition Plan: PT eval-- ? CIR; discussed care with Dr. Posey Pronto   Medical Consultants:    renal     Subjective:   Says he is feeling a little better  Objective:    Vitals:   04/24/19 1157 04/24/19 1233 04/24/19 2134 04/25/19 0440  BP:  134/71 128/72 122/79  Pulse:  63  61 64  Resp:  18 18 18   Temp: 97.8 F (36.6 C) 97.7 F (36.5 C) 98.2 F (36.8 C) 98 F (36.7 C)  TempSrc: Oral Oral Oral Oral  SpO2:  100% 99% 100%  Weight:      Height:        Intake/Output Summary (Last 24 hours) at  04/25/2019 1243 Last data filed at 04/25/2019 1000 Gross per 24 hour  Intake 1080 ml  Output 1200 ml  Net -120 ml   Filed Weights   04/23/19 1745  Weight: 83 kg    Exam: In bed, more interactive  Pale skin B/l amputee No increased work of breathing + edema in upper left arm   Data Reviewed:   I have personally reviewed following labs and imaging studies:  Labs: Labs show the following:   Basic Metabolic Panel: Recent Labs  Lab 04/23/19 1808 04/24/19 0520 04/25/19 0342  NA 137 139 141  K 3.8 3.2* 3.1*  CL 100 106 107  CO2 25 25 26   GLUCOSE 390* 259* 129*  BUN 49* 48* 49*  CREATININE 4.25* 3.96* 3.94*  CALCIUM 8.1* 7.6* 7.7*  MG  --  1.6*  --    GFR Estimated Creatinine Clearance: 20.5 mL/min (A) (by C-G formula based on SCr of 3.94 mg/dL (H)). Liver Function Tests: Recent Labs  Lab 04/23/19 1808 04/25/19 0342  AST 24 11*  ALT 12 9  ALKPHOS 78 61  BILITOT 0.9 0.5  PROT 3.9* 3.5*  ALBUMIN 1.9* 1.6*    Recent Labs  Lab 04/23/19 1808  LIPASE 14   No results for input(s): AMMONIA in the last 168 hours. Coagulation profile No results for input(s): INR, PROTIME in the last 168 hours.  CBC: Recent Labs  Lab 04/20/19 0953 04/20/19 1025 04/23/19 1808 04/24/19 0520 04/25/19 0342  WBC  --   --  21.5* 17.9* 11.5*  NEUTROABS  --   --  19.7*  --  8.7*  HGB 13.9 10.5* 8.4* 8.7* 8.5*  HCT  --   --  27.1* 28.1* 27.5*  MCV  --   --  92.8 93.4 93.5  PLT  --   --  112* 98* 87*   Cardiac Enzymes: No results for input(s): CKTOTAL, CKMB, CKMBINDEX, TROPONINI in the last 168 hours. BNP (last 3 results) No results for input(s): PROBNP in the last 8760 hours. CBG: Recent Labs  Lab 04/24/19 1236 04/24/19 1652 04/24/19 2136 04/25/19 0743 04/25/19 1125  GLUCAP 138* 177* 215* 88 77   D-Dimer: No results for input(s): DDIMER in the last 72 hours. Hgb A1c: Recent Labs    04/24/19 0520  HGBA1C 8.1*   Lipid Profile: No results for input(s): CHOL, HDL,  LDLCALC, TRIG, CHOLHDL, LDLDIRECT in the last 72 hours. Thyroid function studies: No results for input(s): TSH, T4TOTAL, T3FREE, THYROIDAB in the last 72 hours.  Invalid input(s): FREET3 Anemia work up: No results for input(s): VITAMINB12, FOLATE, FERRITIN, TIBC, IRON, RETICCTPCT in the last 72 hours. Sepsis Labs: Recent Labs  Lab 04/23/19 1808 04/23/19 2353 04/24/19 0520 04/25/19 0342  PROCALCITON  --   --  57.32  --   WBC 21.5*  --  17.9* 11.5*  LATICACIDVEN  --  1.1  --   --     Microbiology Recent Results (from the past 240 hour(s))  SARS CORONAVIRUS 2 (TAT 6-24 HRS) Nasopharyngeal Nasopharyngeal Swab     Status: None   Collection Time: 04/23/19 11:53 PM   Specimen: Nasopharyngeal Swab  Result Value  Ref Range Status   SARS Coronavirus 2 NEGATIVE NEGATIVE Final    Comment: (NOTE) SARS-CoV-2 target nucleic acids are NOT DETECTED. The SARS-CoV-2 RNA is generally detectable in upper and lower respiratory specimens during the acute phase of infection. Negative results do not preclude SARS-CoV-2 infection, do not rule out co-infections with other pathogens, and should not be used as the sole basis for treatment or other patient management decisions. Negative results must be combined with clinical observations, patient history, and epidemiological information. The expected result is Negative. Fact Sheet for Patients: SugarRoll.be Fact Sheet for Healthcare Providers: https://www.woods-mathews.com/ This test is not yet approved or cleared by the [place] FDA and  has been authorized for detection and/or diagnosis of SARS-CoV-2 by FDA under an Emergency Use Authorization (EUA). This EUA will remain  in effect (meaning this test can be used) for the duration of the COVID-19 declaration under Section 56 4(b)(1) of the Act, 21 U.S.C. section 360bbb-3(b)(1), unless the authorization is terminated or revoked sooner. Performed at [facility], [place] [address], [place], [place] [address]   Culture, blood (routine x 2)     Status: None (Preliminary result)   Collection Time: 04/23/19 11:53 PM   Specimen: BLOOD  Result Value Ref Range Status   Specimen Description BLOOD LEFT FOREARM  Final   Special Requests   Final    BOTTLES DRAWN AEROBIC AND ANAEROBIC Blood Culture adequate volume   Culture   Final    NO GROWTH 1 DAY Performed at [facility], [place] [address] [place], [place] [address]    Report Status PENDING  Incomplete    Procedures and diagnostic studies:  Ct Abdomen Pelvis Wo Contrast  Result Date: 04/23/2019 CLINICAL DATA:  Acute generalized abdominal pain, history end-stage renal disease on dialysis, type I diabetes mellitus, GERD, hypertension, atrial fibrillation EXAM: CT ABDOMEN AND PELVIS WITHOUT CONTRAST TECHNIQUE: Multidetector CT imaging of the abdomen and pelvis was performed following the standard protocol without IV contrast. Sagittal and coronal MPR images reconstructed from axial data set. No oral contrast. COMPARISON:  02/15/2019 FINDINGS: Lower chest: Small LEFT pleural effusion. Dependent bibasilar atelectasis. Minimal pericardial fluid. Hepatobiliary: Fatty infiltration of liver. Gallbladder surgically absent. Chronic pneumobilia. No definite hepatic mass. Pancreas: Atrophic Spleen: Normal appearance Adrenals/Urinary Tract: Adrenal glands normal appearance. Atrophic native kidneys containing calculi and renal vascular calcifications. Transplant kidney RIGHT iliac fossa. No evidence of renal mass or hydronephrosis. Bladder well distended. Stomach/Bowel: Fluid within stomach with questionable gastric wall thickening versus artifact from underdistention. Large and small bowel loops unremarkable. Vascular/Lymphatic: Extensive atherosclerotic calcifications of both large and small vessels. Aorta normal caliber. Multiple pelvic phleboliths. No adenopathy. Reproductive: Mild prostatic enlargement  gland 4.6 x 3.9 cm. Other: Significant ascites. Scattered soft tissue edema. No free air or hernia. Musculoskeletal: Osseous demineralization. IMPRESSION: Scattered ascites and soft tissue edema, nonspecific. Small LEFT pleural effusion with bibasilar atelectasis. Fatty infiltration of liver. Transplant kidney RIGHT iliac fossa without evidence of mass or hydronephrosis. Mild prostatic enlargement. Chronic pneumobilia suspect from prior biliary intervention. No other acute intra-abdominal or intrapelvic abnormalities. Aortic Atherosclerosis (ICD10-I70.0). Electronically Signed   By: Lavonia Dana M.D.   On: 04/23/2019 21:00   US Renal  Result Date: 04/24/2019 CLINICAL DATA:  Initial evaluation for elevated BUN and creatinine, acute renal injury. EXAM: RENAL / URINARY TRACT ULTRASOUND COMPLETE COMPARISON:  Prior CT from 04/23/2019. FINDINGS: Right Kidney: Renal measurements: 8.0 x 2.6 x 4.0 cm = volume: 42.7 mL. Right kidney atrophic in  appearance with diffuse cortical thinning and increased echogenicity within the renal parenchyma. 9 mm shadowing stone present at the upper pole. No hydronephrosis. No focal renal mass. Left Kidney: Renal measurements: 8.6 x 5.0 x 5.3 cm = volume: 116.0 mL. Left kidney atrophic in appearance with diffuse cortical thinning and increased echogenicity within the renal parenchyma. No visible nephrolithiasis or hydronephrosis. No focal renal mass. Bladder: Appears normal for degree of bladder distention. Prostate measures 4.5 x 2.5 x 4.8 cm. Other: Small volume ascites noted within the visualized abdomen. Increased echogenicity noted within the visualized liver, suggesting steatosis. IMPRESSION: 1. Renal atrophy with diffuse cortical thinning and increased echogenicity within the renal parenchyma, compatible with chronic medical renal disease. 2. No hydronephrosis. 3. 9 mm nonobstructive right renal nephrolithiasis. 4. Small volume ascites. 5. Hepatic steatosis. Electronically Signed    By: Jeannine Boga M.D.   On: 04/24/2019 04:27   Dg Chest Port 1 View  Result Date: 04/23/2019 CLINICAL DATA:  Fever EXAM: PORTABLE CHEST 1 VIEW COMPARISON:  None. FINDINGS: The heart size and mediastinal contours are within normal limits. Both lungs are clear. The visualized skeletal structures are unremarkable. IMPRESSION: No active disease. Electronically Signed   By: Kerby Moors M.D.   On: 04/23/2019 20:15    Medications:   . apixaban  5 mg Oral BID  . aspirin EC  81 mg Oral Daily  . [START ON 04/26/2019] atorvastatin  10 mg Oral Q M,W,F  . B-complex with vitamin C  1 tablet Oral Daily  . calcitRIOL  0.25 mcg Oral Daily  . calcium acetate  1,334 mg Oral TID WC  . cholecalciferol  4,000 Units Oral Daily  . feeding supplement (GLUCERNA SHAKE)  237 mL Oral TID BM  . insulin aspart  0-5 Units Subcutaneous QHS  . insulin aspart  0-9 Units Subcutaneous TID WC  . insulin glargine  10 Units Subcutaneous Daily  . magnesium chloride  2 tablet Oral BID  . mycophenolate  360 mg Oral BID  . pantoprazole  40 mg Oral Daily  . potassium chloride  20 mEq Oral BID  . predniSONE  5 mg Oral Q breakfast  . sodium bicarbonate  1,950 mg Oral QID  . tacrolimus  3 mg Oral Q12H  . tamsulosin  0.4 mg Oral QPC supper   Continuous Infusions: . ceFEPime (MAXIPIME) IV Stopped (04/24/19 2326)  . metronidazole 500 mg (04/25/19 0546)  . vancomycin       LOS: 1 day   Geradine Girt  [facility]   How to contact the [facility] Attending or Consulting provider [place] or covering provider during after hours [place], for this patient?  1. Check the care team in [facility] and look for a) attending/consulting [facility] provider listed and b) the [facility] team listed 2. Log into [email or web address] and use [facility]'s universal password to access. If you do not have the password, please contact the hospital operator. 3. Locate the [facility] provider you are looking for under [facility] and page to a number that you can  be directly reached. 4. If you still have difficulty reaching the provider, please page the [facility] (Director on Call) for the Hospitalists listed on amion for assistance.  04/25/2019, 12:43 PM

## 2019-04-26 ENCOUNTER — Encounter (HOSPITAL_COMMUNITY): Payer: Self-pay | Admitting: General Practice

## 2019-04-26 LAB — BASIC METABOLIC PANEL
Anion gap: 8 (ref 5–15)
BUN: 47 mg/dL — ABNORMAL HIGH (ref 8–23)
CO2: 25 mmol/L (ref 22–32)
Calcium: 7.9 mg/dL — ABNORMAL LOW (ref 8.9–10.3)
Chloride: 109 mmol/L (ref 98–111)
Creatinine, Ser: 3.79 mg/dL — ABNORMAL HIGH (ref 0.61–1.24)
GFR calc Af Amer: 18 mL/min — ABNORMAL LOW (ref >60)
GFR calc non Af Amer: 16 mL/min — ABNORMAL LOW (ref >60)
Glucose, Bld: 251 mg/dL — ABNORMAL HIGH (ref 70–99)
Potassium: 3.8 mmol/L (ref 3.5–5.1)
Sodium: 142 mmol/L (ref 135–145)

## 2019-04-26 LAB — GLUCOSE, CAPILLARY
Glucose-Capillary: 230 mg/dL — ABNORMAL HIGH (ref 70–99)
Glucose-Capillary: 272 mg/dL — ABNORMAL HIGH (ref 70–99)
Glucose-Capillary: 291 mg/dL — ABNORMAL HIGH (ref 70–99)
Glucose-Capillary: 245 mg/dL — ABNORMAL HIGH (ref 70–99)

## 2019-04-26 LAB — CBC
HCT: 27.6 % — ABNORMAL LOW (ref 39.0–52.0)
Hemoglobin: 8.3 g/dL — ABNORMAL LOW (ref 13.0–17.0)
MCH: 28.7 pg (ref 26.0–34.0)
MCHC: 30.1 g/dL (ref 30.0–36.0)
MCV: 95.5 fL (ref 80.0–100.0)
Platelets: 63 10*3/uL — ABNORMAL LOW (ref 150–400)
RBC: 2.89 MIL/uL — ABNORMAL LOW (ref 4.22–5.81)
RDW: 14.2 % (ref 11.5–15.5)
WBC: 8.8 10*3/uL (ref 4.0–10.5)
nRBC: 0 % (ref 0.0–0.2)

## 2019-04-26 LAB — TECHNOLOGIST SMEAR REVIEW

## 2019-04-26 MED ORDER — TORSEMIDE 20 MG PO TABS
40.0000 mg | ORAL_TABLET | Freq: Every day | ORAL | Status: DC
  Administered 2019-04-26 – 2019-04-29 (×4): 40 mg via ORAL
  Filled 2019-04-26 (×4): qty 2

## 2019-04-26 MED ORDER — RISAQUAD PO CAPS
1.0000 | ORAL_CAPSULE | Freq: Every day | ORAL | Status: DC
  Administered 2019-04-26 – 2019-04-29 (×4): 1 via ORAL
  Filled 2019-04-26 (×4): qty 1

## 2019-04-26 NOTE — Progress Notes (Signed)
Patient ID: Paul [name], male   DOB: 1955/08/20, 63 y.o.   MRN: [id number] [facility] Progress Note   Assessment/ Plan:   1.  Fever, leukocytosis and deconditioning: Afebrile since admission on broad-spectrum antimicrobial coverage that is now being narrowed down.  No definitive focus of infection and culture data negative to date.  OI viral studies pending. 2.  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    Continue current immunosuppressive medications and restart diuretic today-we will switch to torsemide for now and reassess efficacy. 3.  Nausea/vomiting:  Clinically better. 4.  Anemia of chronic disease: Reports recent (12/2) Aranesp administration.    Iron stores replete. 5.  Hypokalemia:  Likely from total body deficit in the setting of recent diuretic use; continue oral supplementation.  Replace magnesium.  Reports chronic diarrhea and inquires about "a medication that he was put on after his Whipple procedure that reduced diarrhea".  We will verify the identity of this latter medication.  Subjective:   Reports a loose watery bowel movement earlier today.  Able to sit up in chair for about 1-1/2 hours where he had his breakfast.   Objective:   BP (!) 153/69 (BP Location: Left Arm)   Pulse (!) 53   Temp 97.7 F (36.5 C) (Oral)   Resp 18   Ht 6' (1.829 m)   Wt 83 kg   SpO2 100%   BMI 24.82 kg/m   Intake/Output Summary (Last 24 hours) at 04/26/2019 1331 Last data filed at 04/26/2019 0813 Gross per 24 hour  Intake 869.8 ml  Output 900 ml  Net -30.2 ml   Weight change:   Physical Exam: Gen: Comfortably resting in bed CVS: Pulse regular rhythm, normal rate, S1 and S2 normal Resp: Diminished breath sounds over bases, no distinct rales or rhonchi Abd: Soft, obese, nontender Ext: 1+ pitting edema bilateral BKA  stumps, trace-1+ dependent edema, 1-2+ upper extremity edema  Imaging: No results found.  Labs: BMET Recent Labs  Lab 04/23/19 1808 04/24/19 0520 04/25/19 0342 04/26/19 0337  NA 137 139 141 142  K 3.8 3.2* 3.1* 3.8  CL 100 106 107 109  CO2 25 25 26 25   GLUCOSE 390* 259* 129* 251*  BUN 49* 48* 49* 47*  CREATININE 4.25* 3.96* 3.94* 3.79*  CALCIUM 8.1* 7.6* 7.7* 7.9*   CBC Recent Labs  Lab 04/23/19 1808 04/24/19 0520 04/25/19 0342 04/26/19 0337  WBC 21.5* 17.9* 11.5* 8.8  NEUTROABS 19.7*  --  8.7*  --   HGB 8.4* 8.7* 8.5* 8.3*  HCT 27.1* 28.1* 27.5* 27.6*  MCV 92.8 93.4 93.5 95.5  PLT 112* 98* 87* 63*   Medications:    . acidophilus  1 capsule Oral Daily  . apixaban  5 mg Oral BID  . aspirin EC  81 mg Oral Daily  . atorvastatin  10 mg Oral Q M,W,F  . B-complex with vitamin C  1 tablet Oral Daily  . calcitRIOL  0.25 mcg Oral Daily  . calcium acetate  1,334 mg Oral TID WC  . cholecalciferol  4,000 Units Oral Daily  . feeding supplement (GLUCERNA SHAKE)  237 mL Oral TID BM  . insulin aspart  0-5 Units Subcutaneous QHS  . insulin aspart  0-9 Units Subcutaneous TID WC  . insulin glargine  10 Units Subcutaneous Daily  . magnesium chloride  2 tablet Oral  BID  . mycophenolate  360 mg Oral BID  . pantoprazole  40 mg Oral Daily  . predniSONE  5 mg Oral Q breakfast  . sodium bicarbonate  1,950 mg Oral QID  . tacrolimus  3 mg Oral Q12H  . tamsulosin  0.4 mg Oral QPC supper   Elmarie Shiley, MD 04/26/2019, 1:31 PM

## 2019-04-26 NOTE — Progress Notes (Signed)
Pharmacy Antibiotic Note  Paul [name] is a 63 y.o. male admitted on 04/23/2019 with sepsis.  Known CKD, SCr remains around 4  All cx neg, but PCT was 55 the other day.   Plan: Vancomycin 1250mg  IV Q48H Cefepime 2gm IV Q24H F/u renal fxn, C&S, clinical status and peak/trough at SS  Height: 6' (182.9 cm) Weight: 183 lb (83 kg) IBW/kg (Calculated) : 77.6  Temp (24hrs), Avg:98 F (36.7 C), Min:97.7 F (36.5 C), Max:98.6 F (37 C)  Recent Labs  Lab 04/23/19 1808 04/23/19 2353 04/24/19 0520 04/25/19 0342 04/26/19 0337  WBC 21.5*  --  17.9* 11.5* 8.8  CREATININE 4.25*  --  3.96* 3.94* 3.79*  LATICACIDVEN  --  1.1  --   --   --     Estimated Creatinine Clearance: 21.3 mL/min (A) (by C-G formula based on SCr of 3.79 mg/dL (H)).    Allergies  Allergen Reactions  . Metformin And Related Other (See Comments)    Has kidney problems   Barth Kirks, PharmD, BCPS, BCCCP Clinical Pharmacist [phone]  Please check AMION for all [place] numbers  04/26/2019 10:36 AM

## 2019-04-26 NOTE — Progress Notes (Addendum)
Nutrition Follow-up  DOCUMENTATION CODES:   Not applicable  INTERVENTION:   -Continue Glucerna Shake po TID, each supplement provides 220 kcal and 10 grams of protein -Continue MVI with minerals daily  NUTRITION DIAGNOSIS:   Inadequate oral intake related to acute illness, chronic illness(fever and leukocytosis; h/o renal transplant, CKDIV; duodenal adenocarcinoma s/p resection) as evidenced by per patient/family report(po intake of small snacks 1-2/day x 3 weeks).  Ongoing  GOAL:   Patient will meet greater than or equal to 90% of their needs  Progressing   MONITOR:   PO intake, Labs, I & O's, Supplement acceptance, Weight trends  REASON FOR ASSESSMENT:   Consult Assessment of nutrition requirement/status  ASSESSMENT:   63 year old male with past medical history of renal transplant, CKD IV, calciphylaxis, anemia of chronic disease, a-fib, duodenal adenocarcinoma s/p resection, IDDM, diabetic neuropathy s/p bilateral BKA and HTN. Patient presented to ED for evaluation of fever, nausea/vomiting, hyperglycemia and reports not feeling well over the past few days and bilateral hand swelling for the past month.  Reviewed I/O's: -240 ml x 24 hours and +2.2 L since admission  UOP: 1.5 L x 24 hours  Spoke with pt at bedside, who reports improved appetite since admission. He was about to order lunch prior to RD visit. Noted pt consumed 100% of breakfast (documented meal completion 100%). Pt likes Glucerna supplements and has been consuming them.   PTA pt reports poor appetite over the past week, however typically has a good appetite, consuming 2-3 meals daily.   Pt denies any weight loss. He suspects some wt gain secondary to edema. He reports he recently transitioned to BKA shrinkers to assist with fitting of prosthetics.   Discussed importance of good meal and supplement intake to promote healing. Pt with no further questions and amenable to continue with current plan of care.    Medications reviewed and inlcude prograf and prednisone.   Labs reviewed: CBGS: 77-241 (inpatient orders for glycemic control are 0-5 units insulin aspart q HS, 0-9 units insulin aspart TIDH with meals, and 10 units insulin glargine daily).   NUTRITION - FOCUSED PHYSICAL EXAM:    Most Recent Value  Orbital Region  No depletion  Upper Arm Region  No depletion  Thoracic and Lumbar Region  No depletion  Buccal Region  No depletion  Temple Region  No depletion  Clavicle Bone Region  No depletion  Clavicle and Acromion Bone Region  No depletion  Scapular Bone Region  No depletion  Dorsal Hand  No depletion  Patellar Region  No depletion  Anterior Thigh Region  No depletion  Posterior Calf Region  Unable to assess  Edema (RD Assessment)  Mild  Hair  Reviewed  Eyes  Reviewed  Mouth  Reviewed  Skin  Reviewed  Nails  Reviewed     Diet Order:   Diet Order            Diet Carb Modified Fluid consistency: Thin; Room service appropriate? Yes  Diet effective now              EDUCATION NEEDS:   No education needs have been identified at this time  Skin:  Skin Assessment: Reviewed RN Assessment  Last BM:  04/24/19  Height:   Ht Readings from Last 1 Encounters:  04/23/19 6' (1.829 m)    Weight:   Wt Readings from Last 1 Encounters:  04/23/19 83 kg    Ideal Body Weight:  71.4 kg(Adjusted IBW for bilateral BKA)  BMI:  Body mass index is 24.82 kg/m.  Estimated Nutritional Needs:   Kcal:  2000-2150  Protein:  100-108  Fluid:  >/= 2 L/day    Khady Vandenberg A. Jimmye Norman, RD, LDN, [place] Registered Dietitian II Certified Diabetes Care and Education Specialist Pager: [phone] After hours Pager: [phone]

## 2019-04-26 NOTE — Progress Notes (Addendum)
Progress Note    Paul [name]  URK:[id number] DOB: [birth year]  DOA: 04/23/2019 PCP: [name], MD    Brief Narrative:     Medical records reviewed and are as summarized below:  Paul [name] is an 63 y.o. male with medical history significant of renal transplant, CKD stage IV, calciphylaxis, anemia of chronic disease, A. Fib, duodenal adenocarcinoma status post resection, insulin-dependent diabetes mellitus, diabetic neuropathy, status post bilateral BKA, hypertension presenting to the ED for evaluation of fever, nausea/vomiting, and hyperglycemia.  Patient states he has not been feeling well for the past few days.  Today he had a subjective temperature of 100.3 F.   Assessment/Plan:   Principal Problem:   Fever Active Problems:   Diabetes mellitus, type 2 (HCC)   AKI (acute kidney injury) ([place])   Hyperglycemia   Nausea and vomiting    Fever and leukocytosis - possible infection; unknown source- blood cultures still pending Patient presenting with complaints of a fever of 100.3 F.  No antipyretics taken at home and on vitals done in the ED he is afebrile.   -significant leukocytosis with WBC count 83.1 with neutrophilic predominance.  Lactic acid normal.  Hemodynamically stable. Chest x-ray not suggestive of pneumonia.  U/A negative CT abdomen pelvis without obvious infectious source - scattered ascites and soft tissue edema, non specific.  SBP less likely as no ascites or abdominal tenderness appreciated on exam. -start to narrow abx -Blood culture pending - WBCs trending down -SARS-CoV-2 test negative -procalcitonin level markedly elevated but he is a transplant patient so not sure how to interpret -continue to monitor -CMV, epstein barr pending  AKI on CKD stage IV, history of renal transplant BUN 49, creatinine 4.2.  Creatinine ranging between 3.1-4.1 on labs done in June 2020.  Most recent prior lab reading was creatinine 3.6 on 6/17.  Suspect prerenal due to  decreased p.o. intake/vomiting. -IV fluid hydration -Cr improved after hydration -defer resumption of lasix to renal  Hyperglycemia, insulin-dependent diabetes mellitus Patient has not been able to take insulin for the past few days as he has been vomiting and not feeling well.  -SSI -resume insulin  Nausea and vomiting -Lipase and LFTs normal.  Abdominal exam benign.  Abdominal CT without obvious source. -IV fluid hydration -Zofran as needed -? Viral etiology?  Anemia of chronic renal disease Hemoglobin 8.4, ranging between 9-13 since September 2020.  Previously baseline was in the 8-9 range.  No signs of active bleeding.  Ferritin high and TIBC low on iron studies done 12/1. -Continue to monitor  Mild thrombocytopenia Likely related to chronic renal disease/ hypoalbuminemia.  -trending down so will need close monitoring  -will get smear  A. fib Currently in sinus rhythm.  On anticoagulation with Eliquis.  Severe protein calorie malnutrition Albumin 1.9. -Nutrition consult  Hypomagnesemia/hypokalemia -repleted  Weakness/deconditioning -? Depression- patient did not want to start an SSRI -wife reports he took himself off the one he was on 6 months or so ago  Spoke with wife-- over the last 3 months patient has been doing less for himself and spending more and more time in bed.  Only new medication is hydralazine and recent increase in lasix.    Family Communication/Anticipated D/C date and plan/Code Status   DVT prophylaxis: eliquis Code Status: Full Code.  Family Communication: spoke with wife 12/5 Disposition Plan: PT eval   Medical Consultants:    renal     Subjective:   Appetite better today Got dizzy when  he was up using the bathroom  Objective:    Vitals:   04/25/19 0440 04/25/19 1430 04/25/19 2012 04/26/19 0551  BP: 122/79 115/66 112/66 (!) 153/69  Pulse: 64 77 65 (!) 53  Resp: 18 18 18 18   Temp: 98 F (36.7 C) 97.7 F (36.5 C) 98.6  F (37 C) 97.7 F (36.5 C)  TempSrc: Oral Oral Oral Oral  SpO2: 100% 98% 100% 100%  Weight:      Height:        Intake/Output Summary (Last 24 hours) at 04/26/2019 1040 Last data filed at 04/26/2019 0813 Gross per 24 hour  Intake 1109.8 ml  Output 900 ml  Net 209.8 ml   Filed Weights   04/23/19 1745  Weight: 83 kg    Exam: In bed, pale, chronically ill appearing rrr Left arm swelling with few areas of bruising B/l amputee No increased work of breathing   Data Reviewed:   I have personally reviewed following labs and imaging studies:  Labs: Labs show the following:   Basic Metabolic Panel: Recent Labs  Lab 04/23/19 1808 04/24/19 0520 04/25/19 0342 04/26/19 0337  NA 137 139 141 142  K 3.8 3.2* 3.1* 3.8  CL 100 106 107 109  CO2 25 25 26 25   GLUCOSE 390* 259* 129* 251*  BUN 49* 48* 49* 47*  CREATININE 4.25* 3.96* 3.94* 3.79*  CALCIUM 8.1* 7.6* 7.7* 7.9*  MG  --  1.6*  --   --    GFR Estimated Creatinine Clearance: 21.3 mL/min (A) (by C-G formula based on SCr of 3.79 mg/dL (H)). Liver Function Tests: Recent Labs  Lab 04/23/19 1808 04/25/19 0342  AST 24 11*  ALT 12 9  ALKPHOS 78 61  BILITOT 0.9 0.5  PROT 3.9* 3.5*  ALBUMIN 1.9* 1.6*    Recent Labs  Lab 04/23/19 1808  LIPASE 14   No results for input(s): AMMONIA in the last 168 hours. Coagulation profile No results for input(s): INR, PROTIME in the last 168 hours.  CBC: Recent Labs  Lab 04/20/19 1025 04/23/19 1808 04/24/19 0520 04/25/19 0342 04/26/19 0337  WBC  --  21.5* 17.9* 11.5* 8.8  NEUTROABS  --  19.7*  --  8.7*  --   HGB 10.5* 8.4* 8.7* 8.5* 8.3*  HCT  --  27.1* 28.1* 27.5* 27.6*  MCV  --  92.8 93.4 93.5 95.5  PLT  --  112* 98* 87* 63*   Cardiac Enzymes: No results for input(s): CKTOTAL, CKMB, CKMBINDEX, TROPONINI in the last 168 hours. BNP (last 3 results) No results for input(s): PROBNP in the last 8760 hours. CBG: Recent Labs  Lab 04/25/19 0743 04/25/19 1125 04/25/19  1706 04/25/19 2107 04/26/19 0757  GLUCAP 88 77 211* 241* 230*   D-Dimer: No results for input(s): DDIMER in the last 72 hours. Hgb A1c: Recent Labs    04/24/19 0520  HGBA1C 8.1*   Lipid Profile: No results for input(s): CHOL, HDL, LDLCALC, TRIG, CHOLHDL, LDLDIRECT in the last 72 hours. Thyroid function studies: No results for input(s): TSH, T4TOTAL, T3FREE, THYROIDAB in the last 72 hours.  Invalid input(s): FREET3 Anemia work up: No results for input(s): VITAMINB12, FOLATE, FERRITIN, TIBC, IRON, RETICCTPCT in the last 72 hours. Sepsis Labs: Recent Labs  Lab 04/23/19 1808 04/23/19 2353 04/24/19 0520 04/25/19 0342 04/26/19 0337  PROCALCITON  --   --  57.32  --   --   WBC 21.5*  --  17.9* 11.5* 8.8  LATICACIDVEN  --  1.1  --   --   --  Microbiology Recent Results (from the past 240 hour(s))  SARS CORONAVIRUS 2 (TAT 6-24 HRS) Nasopharyngeal Nasopharyngeal Swab     Status: None   Collection Time: 04/23/19 11:53 PM   Specimen: Nasopharyngeal Swab  Result Value Ref Range Status   SARS Coronavirus 2 NEGATIVE NEGATIVE Final    Comment: (NOTE) SARS-CoV-2 target nucleic acids are NOT DETECTED. The SARS-CoV-2 RNA is generally detectable in upper and lower respiratory specimens during the acute phase of infection. Negative results do not preclude SARS-CoV-2 infection, do not rule out co-infections with other pathogens, and should not be used as the sole basis for treatment or other patient management decisions. Negative results must be combined with clinical observations, patient history, and epidemiological information. The expected result is Negative. Fact Sheet for Patients: SugarRoll.be Fact Sheet for Healthcare Providers: https://www.woods-mathews.com/ This test is not yet approved or cleared by the [place] FDA and  has been authorized for detection and/or diagnosis of SARS-CoV-2 by FDA under an Emergency Use  Authorization (EUA). This EUA will remain  in effect (meaning this test can be used) for the duration of the COVID-19 declaration under Section 56 4(b)(1) of the Act, 21 U.S.C. section 360bbb-3(b)(1), unless the authorization is terminated or revoked sooner. Performed at [facility], [place] [address], [place], [name] [address]   Culture, blood (routine x 2)     Status: None (Preliminary result)   Collection Time: 04/23/19 11:53 PM   Specimen: BLOOD  Result Value Ref Range Status   Specimen Description BLOOD LEFT FOREARM  Final   Special Requests   Final    BOTTLES DRAWN AEROBIC AND ANAEROBIC Blood Culture adequate volume   Culture   Final    NO GROWTH 1 DAY Performed at [facility], [place] [address], [place], [place] [address]    Report Status PENDING  Incomplete    Procedures and diagnostic studies:  No results found.  Medications:   . apixaban  5 mg Oral BID  . aspirin EC  81 mg Oral Daily  . atorvastatin  10 mg Oral Q M,W,F  . B-complex with vitamin C  1 tablet Oral Daily  . calcitRIOL  0.25 mcg Oral Daily  . calcium acetate  1,334 mg Oral TID WC  . cholecalciferol  4,000 Units Oral Daily  . feeding supplement (GLUCERNA SHAKE)  237 mL Oral TID BM  . insulin aspart  0-5 Units Subcutaneous QHS  . insulin aspart  0-9 Units Subcutaneous TID WC  . insulin glargine  10 Units Subcutaneous Daily  . magnesium chloride  2 tablet Oral BID  . mycophenolate  360 mg Oral BID  . pantoprazole  40 mg Oral Daily  . predniSONE  5 mg Oral Q breakfast  . sodium bicarbonate  1,950 mg Oral QID  . tacrolimus  3 mg Oral Q12H  . tamsulosin  0.4 mg Oral QPC supper   Continuous Infusions: . ceFEPime (MAXIPIME) IV 2 g (04/25/19 2200)  . metronidazole 500 mg (04/26/19 0511)  . vancomycin 1,250 mg (04/25/19 2349)     LOS: 2 days   Geradine Girt  [facility]   How to contact the [facility] Attending or Consulting provider [place] or covering provider during after hours [place], for this patient?  1. Check the care team in [facility] and look for a) attending/consulting [facility] provider listed and b) the [facility] team listed 2. Log into [email or web address] and use [facility] universal password to access. If you do  not have the password, please contact the hospital operator. 3. Locate the [facility] provider you are looking for under [facility] and page to a number that you can be directly reached. 4. If you still have difficulty reaching the provider, please page the [facility] (Director on Call) for the Hospitalists listed on amion for assistance.  04/26/2019, 10:40 AM

## 2019-04-26 NOTE — Progress Notes (Signed)
Rehab Admissions Coordinator Note:  Per PT recommendation, this patient was screened by Raechel Ache for appropriateness for an Inpatient Acute Rehab Consult.  Noted pt is already Min G for transfers and ambulation on evaluation with PT. Anticipate pt will continue to progress quickly with therapies while in acute setting. At this time, will not pursue IP rehab consult order.   Raechel Ache 04/26/2019, 7:58 AM  I can be reached at [phone].

## 2019-04-26 NOTE — TOC Initial Note (Signed)
Transition of Care [facility]) - Initial/Assessment Note    Patient Details  Name: Paul [name] MRN: [id number] Date of Birth: 23-Apr-1956  Transition of Care [facility]) CM/SW Contact:    Alexander Mt, [place] Phone Number: 04/26/2019, 5:13 PM  Clinical Narrative:                 CSW met with pt at bedside. Introduced self, role, reason for visit. Pt from home with his spouse [name] and her mother also lives with them. Pt confirms PCP/Cardiac PCP and home address.  Pt very motivated and pleasant. He has arranged his home to be very accessible. He has all needed DME and a home ramp. We discussed CIR had screened pt out due to min assist with ADL/IADLs. He has had many ups and downs with his mobility including issues with prosthetic. We discussed CIR recommendation but that the CIR team felt since he was only requiring minimum assist per PT/OT notes that he may not be a candidate. He has had all levels of rehab care and prefers OPPT over [facility] therapies. His preference is for OPPT on [address] [employer].   TOC team continues to follow for pt disposition/care planning.   Expected Discharge Plan: OP Rehab Barriers to Discharge: Continued Medical Work up   Patient Goals and CMS Choice Patient states their goals for this hospitalization and ongoing recovery are:: to be able to move around again CMS Medicare.gov Compare Post Acute Care list provided to:: (n/a at this time) Choice offered to / list presented to : Patient  Expected Discharge Plan and Services Expected Discharge Plan: OP Rehab In-house Referral: Clinical Social Work Discharge Planning Services: CM Consult Post Acute Care Choice: (OP Rehab) Living arrangements for the past 2 months: Single Family Home  Prior Living Arrangements/Services Living arrangements for the past 2 months: Single Family Home Lives with:: Relatives, Spouse Patient language and need for interpreter reviewed:: Yes(no needs) Do you feel safe going back to the place  where you live?: Yes      Need for Family Participation in Patient Care: Yes (Comment)(assistance with ADL/IADLs as needed) Care giver support system in place?: Yes (comment)(pt wife) Current home services: DME Criminal Activity/Legal Involvement Pertinent to Current Situation/Hospitalization: No - Comment as needed  Activities of Daily Living Home Assistive Devices/Equipment: Wheelchair, Radio producer (specify quad or straight), Bedside commode/3-in-1(PROSTETIC LEGS) ADL Screening (condition at time of admission) Patient's cognitive ability adequate to safely complete daily activities?: Yes Is the patient deaf or have difficulty hearing?: No Does the patient have difficulty seeing, even when wearing glasses/contacts?: No Does the patient have difficulty concentrating, remembering, or making decisions?: No Patient able to express need for assistance with ADLs?: Yes Does the patient have difficulty dressing or bathing?: No Independently performs ADLs?: Yes (appropriate for developmental age) Does the patient have difficulty walking or climbing stairs?: Yes Weakness of Legs: Both(BILATERAL AMPUTEE) Weakness of Arms/Hands: None  Permission Sought/Granted Permission sought to share information with : Facility Sport and exercise psychologist, Family Supports Permission granted to share information with : Yes, Verbal Permission Granted  Share Information with NAME: Shaheer Bonfield     Permission granted to share info w Relationship: wife  Permission granted to share info w Contact Information: [phone]  Emotional Assessment Appearance:: Appears stated age Attitude/Demeanor/Rapport: Gracious, Engaged Affect (typically observed): Pleasant, Adaptable, Accepting Orientation: : Oriented to Self, Oriented to Place, Oriented to  Time, Oriented to Situation Alcohol / Substance Use: Not Applicable Psych Involvement: No (comment)  Admission diagnosis:  Other  ascites [R18.8] AKI (acute kidney injury) ([place])  [N17.9] Non-intractable vomiting with nausea, unspecified vomiting type [R11.2] Fever [R50.9] Patient Active Problem List   Diagnosis Date Noted  . AKI (acute kidney injury) ([place]) 04/24/2019  . Hyperglycemia 04/24/2019  . Nausea and vomiting 04/24/2019  . Fever 04/23/2019  . Acute metabolic encephalopathy 16/02/9603  . Stroke ([place]) 10/28/2018  . Middle cerebral artery embolism, left 10/28/2018  . Metabolic acidosis 54/01/8118  . Dehydration 05/24/2018  . Pressure injury of skin 05/24/2018  . Dyspnea 05/23/2018  . Acute respiratory failure with hypoxia ([place]) 05/23/2018  . Abnormal cardiovascular stress test 12/29/2013  . Awaiting organ transplant 12/09/2013  . Leg ulcer ([place]) 11/17/2013  . Chronic kidney disease (CKD), stage V ([place]) 11/17/2013  . Diabetes ([place]) 11/17/2013  . Cancer of duodenum ([place]) 11/17/2013  . Dyslipidemia 11/17/2013  . Chronic kidney disease requiring chronic dialysis ([place]) 11/17/2013  . Hypercholesteremia 11/17/2013  . H/O malignant neoplasm 11/17/2013  . H/O: HTN (hypertension) 11/17/2013  . BP (high blood pressure) 11/17/2013  . Hypotension 11/17/2013  . Angiopathy, peripheral ([place]) 11/17/2013  . AF (paroxysmal atrial fibrillation) ([place]) 11/17/2013  . BKA stump complication ([place]) 14/78/2956  . S/P bilateral BKA (below knee amputation) ([place]) 08/03/2013  . S/P BKA (below knee amputation) bilateral ([place]) 07/30/2013  . Open wound of both legs with complication 21/30/8657  . Acute blood loss anemia 07/12/2013  . Syncope 07/09/2013  . Intolerance to CAPD peritoneal dialysis s/p CAPD cath removal 07/09/2013 07/05/2013  . Critical lower limb ischemia 06/30/2013  . Extremity pain 06/30/2013  . Atherosclerosis of native arteries of the extremities with ulceration(440.23) 06/17/2013  . Gait disorder 05/31/2013  . Weakness 05/24/2013  . Depression 04/03/2013  . GERD (gastroesophageal reflux disease) 04/03/2013  . Atrial fibrillation ([place]) 04/02/2013  . DM  (diabetes mellitus) ([place]) 12/28/2012  . Other complications due to renal dialysis device, implant, and graft 05/19/2012  . Diabetes mellitus, type 2 ([place]) 11/13/2011  . Anemia 07/02/2011  . ESRD on hemodialysis ([place]) 05/29/2011   PCP:  [name], MD Pharmacy:   [id number] [address]Phone: [phone]: [phone] [place] [address]Phone: [phone]: [phone]  Readmission Risk Interventions Readmission Risk Prevention Plan 04/26/2019 11/04/2018  Transportation Screening Complete Complete  Medication Review (RN Care Manager) Complete Complete  PCP or Specialist appointment within 3-5 days of discharge Not Complete Complete  PCP/Specialist Appt Not Complete comments pending medical stability -  HRI or Home Care Consult Complete Complete  SW Recovery Care/Counseling Consult Complete Complete  Palliative Care Screening Not Complete Not Applicable  Comments may be appropriate, will ask MD -  [place]burg[place] Refused Not Applicable  Some recent data might be hidden

## 2019-04-26 NOTE — Evaluation (Signed)
Occupational Therapy Evaluation Patient Details Name: Paul [name] MRN: [id number] DOB: 1956/04/12 Today's Date: 04/26/2019    History of Present Illness 63 y.o. male with medical history significant of renal transplant, CKD stage IV, calciphylaxis, anemia of chronic disease, A. Fib, duodenal adenocarcinoma status post resection, insulin-dependent diabetes mellitus, diabetic neuropathy, status post bilateral BKA, hypertension presenting to the ED for evaluation of fever, nausea/vomiting, and hyperglycemia.   Clinical Impression   Patient is a 63 year old male that lives with his spouse in an apartment with ramp entry. At baseline patient ambulates house hold distances without AD, uses cane outdoors and is I with self care tasks. Currently patient requiring min guard with sit to stand and min A with functional ambulation using rolling walker for safety as patient reports weakness in B LEs. Mild buckling noted in LEs, patient reports feelings of fatigue and wanting to get back into bed after transfer to bed side chair. Educate patient on importance of out of bed activity, patient cooperative/agreeable to sit up in chair for at least 1 hr after eating breakfast. Recommend continued acute OT services due to decreased activity tolerance, balance, weakness impacting patient's independence with self care.     Follow Up Recommendations  CIR;Supervision/Assistance - 24 hour    Equipment Recommendations  None recommended by OT    Recommendations for Other Services       Precautions / Restrictions Precautions Precautions: Fall Restrictions Weight Bearing Restrictions: No      Mobility Bed Mobility Overal bed mobility: Needs Assistance Bed Mobility: Sidelying to Sit   Sidelying to sit: Min assist;Mod assist       General bed mobility comments: difficulty with trunk mobility, patient slid down in bed after donning shorts having difficulty lifting trunk  Transfers Overall transfer level:  Needs assistance Equipment used: Rolling walker (2 wheeled) Transfers: Sit to/from Stand Sit to Stand: Min guard         General transfer comment: demonstrates adequate safety awareness pushing from bed to stand    Balance Overall balance assessment: Needs assistance Sitting-balance support: No upper extremity supported;Feet unsupported Sitting balance-Leahy Scale: Good     Standing balance support: During functional activity;Bilateral upper extremity supported Standing balance-Leahy Scale: Fair Standing balance comment: requiring B UE support for saftey with balance, typically for house hold distances patient does not use AD at baseline                           ADL either performed or assessed with clinical judgement   ADL Overall ADL's : Needs assistance/impaired Eating/Feeding: Independent;Sitting   Grooming: Set up;Sitting   Upper Body Bathing: Set up;Sitting   Lower Body Bathing: Min guard;Sitting/lateral leans;Sit to/from stand   Upper Body Dressing : Set up;Sitting   Lower Body Dressing: Set up;Bed level;Sitting/lateral leans Lower Body Dressing Details (indicate cue type and reason): patient able to roll laterally in bed to don shorts, pt sit up at edge of bed to don B protheses  Toilet Transfer: Minimal assistance;Ambulation;RW Toilet Transfer Details (indicate cue type and reason): simulated with transfer to bedside chair, note mild buckling in patient's LEs with ambulation. patient reports weakness in LEs more so in L knee compared to R.  Toileting- Water quality scientist and Hygiene: Min guard;Sitting/lateral lean;Sit to/from stand       Functional mobility during ADLs: Minimal assistance;Rolling walker General ADL Comments: patient reporting increased fatigue with simple self care tasks and feels below his base line  Vision Baseline Vision/History: Wears glasses Wears Glasses: At all times       Perception     Praxis      Pertinent  Vitals/Pain Pain Assessment: No/denies pain     Hand Dominance Right   Extremity/Trunk Assessment Upper Extremity Assessment Upper Extremity Assessment: Generalized weakness   Lower Extremity Assessment Lower Extremity Assessment: Defer to PT evaluation   Cervical / Trunk Assessment Cervical / Trunk Assessment: Normal   Communication Communication Communication: No difficulties   Cognition Arousal/Alertness: Awake/alert Behavior During Therapy: WFL for tasks assessed/performed Overall Cognitive Status: Within Functional Limits for tasks assessed                                     General Comments  swelling noted in UEs more so in L UE, patient reports he is retraining fluid            Home Living Family/patient expects to be discharged to:: Private residence Living Arrangements: Spouse/significant other Available Help at Discharge: Family Type of Home: Apartment Home Access: Ramped entrance     Home Layout: Able to live on main level with bedroom/bathroom     Bathroom Shower/Tub: Occupational psychologist: Standard(patient has commode over toilet currently) Bathroom Accessibility: Yes How Accessible: Accessible via walker Home Equipment: [place] - single point;Shower seat;Wheelchair - Water quality scientist - 2 wheels;Hand held shower head;Grab bars - toilet          Prior Functioning/Environment Level of Independence: Independent with assistive device(s)        Comments: independent with cane until just before thanksgiving, now dependent on RW        OT Problem List: Decreased activity tolerance;Impaired balance (sitting and/or standing);Increased edema      OT Treatment/Interventions: Self-care/ADL training;Therapeutic exercise;Energy conservation;DME and/or AE instruction;Therapeutic activities;Patient/family education;Balance training    OT Goals(Current goals can be found in the care plan section) Acute Rehab OT Goals Patient  Stated Goal: To go to CIR and get stronger OT Goal Formulation: With patient Time For Goal Achievement: 05/10/19 Potential to Achieve Goals: Good  OT Frequency: Min 2X/week    AM-PAC OT "6 Clicks" Daily Activity     Outcome Measure Help from another person eating meals?: None Help from another person taking care of personal grooming?: A Little Help from another person toileting, which includes using toliet, bedpan, or urinal?: A Little Help from another person bathing (including washing, rinsing, drying)?: A Little Help from another person to put on and taking off regular upper body clothing?: A Little Help from another person to put on and taking off regular lower body clothing?: A Little 6 Click Score: 19   End of Session Equipment Utilized During Treatment: Gait belt;Rolling walker Nurse Communication: Mobility status  Activity Tolerance: Patient tolerated treatment well Patient left: in chair;with call bell/phone within reach;with chair alarm set  OT Visit Diagnosis: Unsteadiness on feet (R26.81);Muscle weakness (generalized) (M62.81)                Time: 3810-1751 OT Time Calculation (min): 24 min Charges:  OT General Charges $OT Visit: 1 Visit OT Evaluation $OT Eval Moderate Complexity: 1 Mod OT Treatments $Self Care/Home Management : 8-22 mins  Shon Millet OT OT office: [place] 04/26/2019, 10:10 AM

## 2019-04-26 NOTE — Plan of Care (Signed)
  Problem: Clinical Measurements: Goal: Ability to maintain clinical measurements within normal limits will improve Outcome: Progressing Goal: Respiratory complications will improve Outcome: Progressing Goal: Cardiovascular complication will be avoided Outcome: Progressing   Problem: Activity: Goal: Risk for activity intolerance will decrease Outcome: Progressing   Problem: Nutrition: Goal: Adequate nutrition will be maintained Outcome: Progressing   Problem: Coping: Goal: Level of anxiety will decrease Outcome: Progressing   Problem: Elimination: Goal: Will not experience complications related to urinary retention Outcome: Progressing   Problem: Pain Managment: Goal: General experience of comfort will improve Outcome: Progressing   Problem: Safety: Goal: Ability to remain free from injury will improve Outcome: Progressing   Problem: Skin Integrity: Goal: Risk for impaired skin integrity will decrease Outcome: Progressing

## 2019-04-27 LAB — RENAL FUNCTION PANEL
Albumin: 1.6 g/dL — ABNORMAL LOW (ref 3.5–5.0)
Anion gap: 8 (ref 5–15)
BUN: 45 mg/dL — ABNORMAL HIGH (ref 8–23)
CO2: 26 mmol/L (ref 22–32)
Calcium: 8.4 mg/dL — ABNORMAL LOW (ref 8.9–10.3)
Chloride: 108 mmol/L (ref 98–111)
Creatinine, Ser: 3.52 mg/dL — ABNORMAL HIGH (ref 0.61–1.24)
GFR calc Af Amer: 20 mL/min — ABNORMAL LOW (ref >60)
GFR calc non Af Amer: 17 mL/min — ABNORMAL LOW (ref >60)
Glucose, Bld: 198 mg/dL — ABNORMAL HIGH (ref 70–99)
Phosphorus: 1.5 mg/dL — ABNORMAL LOW (ref 2.5–4.6)
Potassium: 4 mmol/L (ref 3.5–5.1)
Sodium: 142 mmol/L (ref 135–145)

## 2019-04-27 LAB — GLUCOSE, CAPILLARY
Glucose-Capillary: 171 mg/dL — ABNORMAL HIGH (ref 70–99)
Glucose-Capillary: 251 mg/dL — ABNORMAL HIGH (ref 70–99)
Glucose-Capillary: 320 mg/dL — ABNORMAL HIGH (ref 70–99)
Glucose-Capillary: 281 mg/dL — ABNORMAL HIGH (ref 70–99)

## 2019-04-27 LAB — CMV DNA, QUANTITATIVE, PCR
CMV DNA Quant: NEGATIVE IU/mL
Log10 CMV Qn DNA Pl: UNDETERMINED log10 IU/mL

## 2019-04-27 LAB — CBC
HCT: 27.5 % — ABNORMAL LOW (ref 39.0–52.0)
Hemoglobin: 8.3 g/dL — ABNORMAL LOW (ref 13.0–17.0)
MCH: 28.2 pg (ref 26.0–34.0)
MCHC: 30.2 g/dL (ref 30.0–36.0)
MCV: 93.5 fL (ref 80.0–100.0)
Platelets: 75 10*3/uL — ABNORMAL LOW (ref 150–400)
RBC: 2.94 MIL/uL — ABNORMAL LOW (ref 4.22–5.81)
RDW: 14.2 % (ref 11.5–15.5)
WBC: 9.4 10*3/uL (ref 4.0–10.5)
nRBC: 0 % (ref 0.0–0.2)

## 2019-04-27 LAB — TACROLIMUS LEVEL: Tacrolimus (FK506) - LabCorp: 16 ng/mL (ref 2.0–20.0)

## 2019-04-27 MED ORDER — INSULIN ASPART 100 UNIT/ML ~~LOC~~ SOLN
3.0000 [IU] | Freq: Three times a day (TID) | SUBCUTANEOUS | Status: DC
  Administered 2019-04-27 – 2019-04-29 (×6): 3 [IU] via SUBCUTANEOUS

## 2019-04-27 MED ORDER — LOPERAMIDE HCL 2 MG PO CAPS: 2.0000 mg | ORAL_CAPSULE | ORAL | Status: DC | PRN

## 2019-04-27 NOTE — Progress Notes (Signed)
Progress Note    Paul [name]  ELF:[id number] DOB: 10-15-1955  DOA: 04/23/2019 PCP: [name], MD    Brief Narrative:     Medical records reviewed and are as summarized below:  Paul [name] is an 63 y.o. male with medical history significant of renal transplant, CKD stage IV, calciphylaxis, anemia of chronic disease, A. Fib, duodenal adenocarcinoma status post resection, insulin-dependent diabetes mellitus, diabetic neuropathy, status post bilateral BKA, hypertension presenting to the ED for evaluation of fever, nausea/vomiting, and hyperglycemia.  Patient states he has not been feeling well for the past few days.  Today he had a subjective temperature of 100.3 F.   Assessment/Plan:   Principal Problem:   Fever Active Problems:   Diabetes mellitus, type 2 (HCC)   AKI (acute kidney injury) ([place])   Hyperglycemia   Nausea and vomiting    Fever and leukocytosis - possible infection; unknown source- blood cultures NGTD Patient presenting with complaints of a fever of 100.3 F.  No antipyretics taken at home and on vitals done in the ED he is afebrile.   -significant leukocytosis with WBC count 58.5 with neutrophilic predominance.  Lactic acid normal.  Hemodynamically stable. Chest x-ray not suggestive of pneumonia.  U/A negative CT abdomen pelvis without obvious infectious source - scattered ascites and soft tissue edema, non specific.  SBP less likely as no ascites or abdominal tenderness appreciated on exam. -start to narrow abx- plan to treat for 7 days -Blood culture NGTD - WBCs trending down -SARS-CoV-2 test negative -procalcitonin level markedly elevated but he is a transplant patient so not sure how to interpret -continue to monitor -CMV, epstein barr pending  AKI on CKD stage IV, history of renal transplant BUN 49, creatinine 4.2.  Creatinine ranging between 3.1-4.1 on labs done in June 2020.  Most recent prior lab reading was creatinine 3.6 on 6/17.  Suspect  prerenal due to decreased p.o. intake/vomiting. -IV fluid hydration -Cr improved after hydration -defer resumption of lasix to renal  Hyperglycemia, insulin-dependent diabetes mellitus Patient has not been able to take insulin for the past few days as he has been vomiting and not feeling well.  -SSI -resume insulin  Nausea and vomiting -Lipase and LFTs normal.  Abdominal exam benign.  Abdominal CT without obvious source. -Zofran as needed -? Viral etiology? -resolved  Anemia of chronic renal disease Hemoglobin 8.4, ranging between 9-13 since September 2020.  Previously baseline was in the 8-9 range.  No signs of active bleeding.  Ferritin high and TIBC low on iron studies done 12/1. -Continue to monitor  Mild thrombocytopenia Likely related to chronic renal disease/ hypoalbuminemia.  -trending down so will need close monitoring   A. fib Currently in sinus rhythm.  On anticoagulation with Eliquis.  Severe protein calorie malnutrition Albumin 1.9. -Nutrition consult  Hypomagnesemia/hypokalemia -repleted  Weakness/deconditioning -? Depression- patient did not want to start an SSRI -wife reports he took himself off the one he was on 6 months or so ago  Spoke with wife-- over the last 3 months patient has been doing less for himself and spending more and more time in bed.  Only new medication is hydralazine and recent increase in lasix.    Family Communication/Anticipated D/C date and plan/Code Status   DVT prophylaxis: eliquis Code Status: Full Code.  Family Communication: spoke with wife 12/5 Disposition Plan: PT eval   Medical Consultants:    renal     Subjective:   Ate breakfast Wants outpatient PT if  he needs anything  Objective:    Vitals:   04/26/19 0551 04/26/19 1456 04/26/19 2031 04/27/19 0442  BP: (!) 153/69 127/74 (!) 150/64 (!) 145/71  Pulse: (!) 53 60 (!) 58 (!) 57  Resp: 18 18 18 16   Temp: 97.7 F (36.5 C) 97.7 F (36.5 C) 97.9 F  (36.6 C) 98.2 F (36.8 C)  TempSrc: Oral Oral Oral Oral  SpO2: 100% 100% 100% 99%  Weight:      Height:        Intake/Output Summary (Last 24 hours) at 04/27/2019 1102 Last data filed at 04/27/2019 0707 Gross per 24 hour  Intake 1057 ml  Output 1975 ml  Net -918 ml   Filed Weights   04/23/19 1745  Weight: 83 kg    Exam: In bed, more engaging No increased work of breathing B/l amputee Left arm with less swelling     Data Reviewed:   I have personally reviewed following labs and imaging studies:  Labs: Labs show the following:   Basic Metabolic Panel: Recent Labs  Lab 04/23/19 1808 04/24/19 0520 04/25/19 0342 04/26/19 0337 04/27/19 0352  NA 137 139 141 142 142  K 3.8 3.2* 3.1* 3.8 4.0  CL 100 106 107 109 108  CO2 25 25 26 25 26   GLUCOSE 390* 259* 129* 251* 198*  BUN 49* 48* 49* 47* 45*  CREATININE 4.25* 3.96* 3.94* 3.79* 3.52*  CALCIUM 8.1* 7.6* 7.7* 7.9* 8.4*  MG  --  1.6*  --   --   --   PHOS  --   --   --   --  1.5*   GFR Estimated Creatinine Clearance: 23 mL/min (A) (by C-G formula based on SCr of 3.52 mg/dL (H)). Liver Function Tests: Recent Labs  Lab 04/23/19 1808 04/25/19 0342 04/27/19 0352  AST 24 11*  --   ALT 12 9  --   ALKPHOS 78 61  --   BILITOT 0.9 0.5  --   PROT 3.9* 3.5*  --   ALBUMIN 1.9* 1.6* 1.6*    Recent Labs  Lab 04/23/19 1808  LIPASE 14   No results for input(s): AMMONIA in the last 168 hours. Coagulation profile No results for input(s): INR, PROTIME in the last 168 hours.  CBC: Recent Labs  Lab 04/23/19 1808 04/24/19 0520 04/25/19 0342 04/26/19 0337 04/27/19 0352  WBC 21.5* 17.9* 11.5* 8.8 9.4  NEUTROABS 19.7*  --  8.7*  --   --   HGB 8.4* 8.7* 8.5* 8.3* 8.3*  HCT 27.1* 28.1* 27.5* 27.6* 27.5*  MCV 92.8 93.4 93.5 95.5 93.5  PLT 112* 98* 87* 63* 75*   Cardiac Enzymes: No results for input(s): CKTOTAL, CKMB, CKMBINDEX, TROPONINI in the last 168 hours. BNP (last 3 results) No results for input(s): PROBNP  in the last 8760 hours. CBG: Recent Labs  Lab 04/26/19 0757 04/26/19 1222 04/26/19 1648 04/26/19 2033 04/27/19 0757  GLUCAP 230* 272* 291* 245* 171*   D-Dimer: No results for input(s): DDIMER in the last 72 hours. Hgb A1c: No results for input(s): HGBA1C in the last 72 hours. Lipid Profile: No results for input(s): CHOL, HDL, LDLCALC, TRIG, CHOLHDL, LDLDIRECT in the last 72 hours. Thyroid function studies: No results for input(s): TSH, T4TOTAL, T3FREE, THYROIDAB in the last 72 hours.  Invalid input(s): FREET3 Anemia work up: No results for input(s): VITAMINB12, FOLATE, FERRITIN, TIBC, IRON, RETICCTPCT in the last 72 hours. Sepsis Labs: Recent Labs  Lab 04/23/19 2353 04/24/19 5625 04/25/19 0342 04/26/19 6389  04/27/19 0352  PROCALCITON  --  57.32  --   --   --   WBC  --  17.9* 11.5* 8.8 9.4  LATICACIDVEN 1.1  --   --   --   --     Microbiology Recent Results (from the past 240 hour(s))  SARS CORONAVIRUS 2 (TAT 6-24 HRS) Nasopharyngeal Nasopharyngeal Swab     Status: None   Collection Time: 04/23/19 11:53 PM   Specimen: Nasopharyngeal Swab  Result Value Ref Range Status   SARS Coronavirus 2 NEGATIVE NEGATIVE Final    Comment: (NOTE) SARS-CoV-2 target nucleic acids are NOT DETECTED. The SARS-CoV-2 RNA is generally detectable in upper and lower respiratory specimens during the acute phase of infection. Negative results do not preclude SARS-CoV-2 infection, do not rule out co-infections with other pathogens, and should not be used as the sole basis for treatment or other patient management decisions. Negative results must be combined with clinical observations, patient history, and epidemiological information. The expected result is Negative. Fact Sheet for Patients: SugarRoll.be Fact Sheet for Healthcare Providers: https://www.woods-mathews.com/ This test is not yet approved or cleared by the [place] FDA and  has been  authorized for detection and/or diagnosis of SARS-CoV-2 by FDA under an Emergency Use Authorization (EUA). This EUA will remain  in effect (meaning this test can be used) for the duration of the COVID-19 declaration under Section 56 4(b)(1) of the Act, 21 U.S.C. section 360bbb-3(b)(1), unless the authorization is terminated or revoked sooner. Performed at [facility], [place] [address], [place], [place] [address]   Culture, blood (routine x 2)     Status: None (Preliminary result)   Collection Time: 04/23/19 11:53 PM   Specimen: BLOOD  Result Value Ref Range Status   Specimen Description BLOOD LEFT FOREARM  Final   Special Requests   Final    BOTTLES DRAWN AEROBIC AND ANAEROBIC Blood Culture adequate volume   Culture   Final    NO GROWTH 3 DAYS Performed at [facility], [place] [address] [place], [place] [address]    Report Status PENDING  Incomplete  Culture, blood (routine x 2)     Status: None (Preliminary result)   Collection Time: 04/23/19 11:55 PM   Specimen: BLOOD  Result Value Ref Range Status   Specimen Description BLOOD LEFT FOREARM  Final   Special Requests   Final    BOTTLES DRAWN AEROBIC AND ANAEROBIC Blood Culture adequate volume   Culture   Final    NO GROWTH 3 DAYS Performed at [facility], [place] [address], [place], [place] [address]    Report Status PENDING  Incomplete    Procedures and diagnostic studies:  No results found.  Medications:   . acidophilus  1 capsule Oral Daily  . apixaban  5 mg Oral BID  . aspirin EC  81 mg Oral Daily  . atorvastatin  10 mg Oral Q M,W,F  . B-complex with vitamin C  1 tablet Oral Daily  . calcitRIOL  0.25 mcg Oral Daily  . calcium acetate  1,334 mg Oral TID WC  . cholecalciferol  4,000 Units Oral Daily  . feeding supplement (GLUCERNA SHAKE)  237 mL Oral TID BM  . insulin aspart  0-5 Units Subcutaneous QHS  . insulin aspart  0-9 Units Subcutaneous TID WC  . insulin glargine  10 Units Subcutaneous Daily   . magnesium chloride  2 tablet Oral BID  . mycophenolate  360 mg Oral BID  .  pantoprazole  40 mg Oral Daily  . predniSONE  5 mg Oral Q breakfast  . sodium bicarbonate  1,950 mg Oral QID  . tacrolimus  3 mg Oral Q12H  . tamsulosin  0.4 mg Oral QPC supper  . torsemide  40 mg Oral Daily   Continuous Infusions: . ceFEPime (MAXIPIME) IV Stopped (04/27/19 0014)  . vancomycin Stopped (04/26/19 0119)     LOS: 3 days   Geradine Girt  [facility]   How to contact the [facility] Attending or Consulting provider [place] or covering provider during after hours [place], for this patient?  1. Check the care team in [facility] and look for a) attending/consulting TRH provider listed and b) the [facility] team listed 2. Log into [email or web address] and use [place]'s universal password to access. If you do not have the password, please contact the hospital operator. 3. Locate the [place] provider you are looking for under [facility] and page to a number that you can be directly reached. 4. If you still have difficulty reaching the provider, please page the [facility] (Director on Call) for the Hospitalists listed on amion for assistance.  04/27/2019, 11:02 AM

## 2019-04-27 NOTE — Progress Notes (Signed)
Patient ID: Paul [name], male   DOB: 1955-11-27, 63 y.o.   MRN: [id number] [facility] Progress Note   Assessment/ Plan:   1.  Fever, leukocytosis and deconditioning: Afebrile since admission on broad-spectrum antimicrobial coverage that is now being narrowed down.  No definitive focus of infection and culture data negative to date.  CMV/EBV viral studies pending. 2.  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    Continue current immunosuppressive medications and restart diuretic today-excellent urine output with initiation of torsemide.  Renal function remained stable. 3.  Nausea/vomiting:  Resolved following admission.  Will begin probiotic and as needed Imodium for diarrhea. 4.  Anemia of chronic disease: Reports recent (12/2) Aranesp administration.    Iron stores replete. 5.  Hypokalemia:  Potassium levels corrected.  Will begin him on a probiotic and as needed Imodium.  Subjective:   Denies any acute events overnight, awaiting PT/mobilization today.   Objective:   BP (!) 145/71 (BP Location: Left Arm)   Pulse (!) 57   Temp 98.2 F (36.8 C) (Oral)   Resp 16   Ht 6' (1.829 m)   Wt 83 kg   SpO2 99%   BMI 24.82 kg/m   Intake/Output Summary (Last 24 hours) at 04/27/2019 1146 Last data filed at 04/27/2019 0707 Gross per 24 hour  Intake 1057 ml  Output 1975 ml  Net -918 ml   Weight change:   Physical Exam: Gen: Comfortably resting in bed ordering from menu [employer]: Pulse regular bradycardia, normal rate, S1 and S2 normal Resp: Diminished breath sounds over bases, no distinct rales or rhonchi Abd: Soft, obese, nontender Ext: 1+ pitting edema bilateral BKA stumps, trace-1+ dependent edema, 1-2+ upper extremity edema  Imaging: No results found.  Labs: BMET Recent Labs  Lab 04/23/19 1808 04/24/19 0520  04/25/19 0342 04/26/19 0337 04/27/19 0352  NA 137 139 141 142 142  K 3.8 3.2* 3.1* 3.8 4.0  CL 100 106 107 109 108  CO2 25 25 26 25 26   GLUCOSE 390* 259* 129* 251* 198*  BUN 49* 48* 49* 47* 45*  CREATININE 4.25* 3.96* 3.94* 3.79* 3.52*  CALCIUM 8.1* 7.6* 7.7* 7.9* 8.4*  PHOS  --   --   --   --  1.5*   CBC Recent Labs  Lab 04/23/19 1808 04/24/19 0520 04/25/19 0342 04/26/19 0337 04/27/19 0352  WBC 21.5* 17.9* 11.5* 8.8 9.4  NEUTROABS 19.7*  --  8.7*  --   --   HGB 8.4* 8.7* 8.5* 8.3* 8.3*  HCT 27.1* 28.1* 27.5* 27.6* 27.5*  MCV 92.8 93.4 93.5 95.5 93.5  PLT 112* 98* 87* 63* 75*   Medications:    . acidophilus  1 capsule Oral Daily  . apixaban  5 mg Oral BID  . aspirin EC  81 mg Oral Daily  . atorvastatin  10 mg Oral Q M,W,F  . B-complex with vitamin C  1 tablet Oral Daily  . calcitRIOL  0.25 mcg Oral Daily  . calcium acetate  1,334 mg Oral TID WC  . cholecalciferol  4,000 Units Oral Daily  . feeding supplement (GLUCERNA SHAKE)  237 mL Oral TID BM  . insulin aspart  0-5 Units Subcutaneous QHS  . insulin aspart  0-9 Units Subcutaneous TID WC  . insulin glargine  10 Units Subcutaneous Daily  . magnesium chloride  2 tablet Oral  BID  . mycophenolate  360 mg Oral BID  . pantoprazole  40 mg Oral Daily  . predniSONE  5 mg Oral Q breakfast  . sodium bicarbonate  1,950 mg Oral QID  . tacrolimus  3 mg Oral Q12H  . tamsulosin  0.4 mg Oral QPC supper  . torsemide  40 mg Oral Daily   Elmarie Shiley, MD 04/27/2019, 11:46 AM

## 2019-04-27 NOTE — Progress Notes (Signed)
Physical Therapy Treatment Patient Details Name: Paul [name] MRN: [id number] DOB: 1955-11-28 Today's Date: 04/27/2019    History of Present Illness 63 y.o. male with medical history significant of renal transplant, CKD stage IV, calciphylaxis, anemia of chronic disease, A. Fib, duodenal adenocarcinoma status post resection, insulin-dependent diabetes mellitus, diabetic neuropathy, status post bilateral BKA, hypertension presenting to the ED for evaluation of fever, nausea/vomiting, and hyperglycemia.    PT Comments    Patient received in bed, agrees to PT session. Able to don B LE prosthetics independently in sitting. Required min assist to achieve steady sitting at edge of bed. Patient transfers with min guard and height of bed elevated. He ambulated 75 feet with RW and min guard for safety. No LOB, slow, steady pace. Reports mild fatigue after walking. He will continue to benefit from skilled PT while here to improve strength and functional independence.       Follow Up Recommendations  Home health PT;Supervision - Intermittent     Equipment Recommendations  None recommended by PT    Recommendations for Other Services       Precautions / Restrictions Precautions Precautions: Fall Precaution Comments: mod fall, B LE prosthetics Restrictions Weight Bearing Restrictions: No    Mobility  Bed Mobility Overal bed mobility: Needs Assistance Bed Mobility: Supine to Sit     Supine to sit: Min assist;HOB elevated     General bed mobility comments: requires assist to get to edge of bed, scooting hips forward  Transfers Overall transfer level: Needs assistance Equipment used: Rolling walker (2 wheeled) Transfers: Sit to/from Stand Sit to Stand: From elevated surface;Min guard            Ambulation/Gait Ambulation/Gait assistance: Min guard Gait Distance (Feet): 75 Feet Assistive device: Rolling walker (2 wheeled)   Gait velocity: reduced   General Gait Details: pt  with step through gait, reduced stride length, slowed gait speed, dependent on BUE support   Stairs             Wheelchair Mobility    Modified Rankin (Stroke Patients Only)       Balance Overall balance assessment: Modified Independent   Sitting balance-Leahy Scale: Normal     Standing balance support: Bilateral upper extremity supported;During functional activity Standing balance-Leahy Scale: Good Standing balance comment: able to don LE prosthetics in sitting without UE support and did not lose balance                            Cognition Arousal/Alertness: Awake/alert Behavior During Therapy: WFL for tasks assessed/performed Overall Cognitive Status: Within Functional Limits for tasks assessed                                        Exercises      General Comments        Pertinent Vitals/Pain Pain Assessment: No/denies pain    Home Living                      Prior Function            PT Goals (current goals can now be found in the care plan section) Acute Rehab PT Goals Patient Stated Goal: to get stronger PT Goal Formulation: With patient Time For Goal Achievement: 05/09/19 Potential to Achieve Goals: Good Progress towards PT goals: Progressing toward goals  Frequency    Min 3X/week      PT Plan Current plan remains appropriate    Co-evaluation              AM-PAC PT "6 Clicks" Mobility   Outcome Measure  Help needed turning from your back to your side while in a flat bed without using bedrails?: None Help needed moving from lying on your back to sitting on the side of a flat bed without using bedrails?: A Little Help needed moving to and from a bed to a chair (including a wheelchair)?: A Little Help needed standing up from a chair using your arms (e.g., wheelchair or bedside chair)?: A Little Help needed to walk in hospital room?: A Little Help needed climbing 3-5 steps with a railing? :  A Little 6 Click Score: 19    End of Session Equipment Utilized During Treatment: Gait belt Activity Tolerance: Patient tolerated treatment well Patient left: in chair;with call bell/phone within reach Nurse Communication: Mobility status PT Visit Diagnosis: Muscle weakness (generalized) (M62.81);Other abnormalities of gait and mobility (R26.89)     Time: 0093-8182 PT Time Calculation (min) (ACUTE ONLY): 28 min  Charges:  $Gait Training: 23-37 mins                     Arrayah Connors, PT, GCS 04/27/19,12:46 PM

## 2019-04-27 NOTE — Progress Notes (Addendum)
Inpatient Diabetes Program Recommendations  AACE/ADA: New Consensus Statement on Inpatient Glycemic Control (2015)  Target Ranges:  Prepandial:   less than 140 mg/dL      Peak postprandial:   less than 180 mg/dL (1-2 hours)      Critically ill patients:  140 - 180 mg/dL   Lab Results  Component Value Date   GLUCAP 171 (H) 04/27/2019   HGBA1C 8.1 (H) 04/24/2019    Review of Glycemic Control Results for Paul [name], Paul [name] (MRN [id number]) as of 04/27/2019 11:39  Ref. Range 04/26/2019 07:57 04/26/2019 12:22 04/26/2019 16:48 04/26/2019 20:33 04/27/2019 07:57  Glucose-Capillary Latest Ref Range: 70 - 99 mg/dL 230 (H)  Novolog 3units + Prednisone 5mg  272 (H)  Novolog 5units 291 (H)  Novolog 5units 245 (H)  Novolog 2units 171 (H)  Lantus 10units     Diabetes history: T1D Outpatient Diabetes medications: Lantus 10 units QD; Humalog SSI TID (2 units if > 150mg /dl and 1 unit for every 100 thereafter) Current orders for Inpatient glycemic control: Lantus 10 units QD; Novolog 0-9 units TID with meals & 0-5 units QHS; Prednisone 5mg  QD  Inpatient Diabetes Program Recommendations:   Insulin - Meal Coverage: Novolog 2-3 units with meals if eats at least 50%   A1c was 13% in June.  Current A1c of 8.1% may be inaccurate as patient is anemic with HGB of 8.3 and CBG's consistently in the 200's.  Will speak to patient.    @1550 -Spoke with patient at bedside.  He states he was diagnosed with T1D in 2002.  He is current with Dr. Chalmers Cater.  Last saw PCP 6 weeks ago.  He states his CBG's are typically 150-200mg /dl.  Explained his A1c was 13% in June which is an average BS of >300 over the last 2-3 months.  Current A1c is 8.1%; however, Hgb is 8.3 today.    He denies difficulties obtaining medications and rotates his sites.  Meal coverage added 3 units starting this evening.  Will continue to follow while inpatient.    Thank you, Geoffry Paradise, RN, BSN Diabetes Coordinator Inpatient Diabetes  Program [phone] (team pager from 8a-5p)

## 2019-04-28 LAB — GLUCOSE, CAPILLARY
Glucose-Capillary: 169 mg/dL — ABNORMAL HIGH (ref 70–99)
Glucose-Capillary: 224 mg/dL — ABNORMAL HIGH (ref 70–99)
Glucose-Capillary: 285 mg/dL — ABNORMAL HIGH (ref 70–99)
Glucose-Capillary: 264 mg/dL — ABNORMAL HIGH (ref 70–99)

## 2019-04-28 LAB — EPSTEIN BARR VRS(EBV DNA BY PCR)
EBV DNA QN by PCR: NEGATIVE copies/mL
log10 EBV DNA Qn PCR: UNDETERMINED log10 copy/mL

## 2019-04-28 LAB — RENAL FUNCTION PANEL
Albumin: 1.7 g/dL — ABNORMAL LOW (ref 3.5–5.0)
Anion gap: 10 (ref 5–15)
BUN: 47 mg/dL — ABNORMAL HIGH (ref 8–23)
CO2: 26 mmol/L (ref 22–32)
Calcium: 8.4 mg/dL — ABNORMAL LOW (ref 8.9–10.3)
Chloride: 105 mmol/L (ref 98–111)
Creatinine, Ser: 3.48 mg/dL — ABNORMAL HIGH (ref 0.61–1.24)
GFR calc Af Amer: 20 mL/min — ABNORMAL LOW (ref >60)
GFR calc non Af Amer: 17 mL/min — ABNORMAL LOW (ref >60)
Glucose, Bld: 172 mg/dL — ABNORMAL HIGH (ref 70–99)
Phosphorus: 1.3 mg/dL — ABNORMAL LOW (ref 2.5–4.6)
Potassium: 4.6 mmol/L (ref 3.5–5.1)
Sodium: 141 mmol/L (ref 135–145)

## 2019-04-28 LAB — CBC
HCT: 26.7 % — ABNORMAL LOW (ref 39.0–52.0)
Hemoglobin: 8.5 g/dL — ABNORMAL LOW (ref 13.0–17.0)
MCH: 29 pg (ref 26.0–34.0)
MCHC: 31.8 g/dL (ref 30.0–36.0)
MCV: 91.1 fL (ref 80.0–100.0)
Platelets: 92 K/uL — ABNORMAL LOW (ref 150–400)
RBC: 2.93 MIL/uL — ABNORMAL LOW (ref 4.22–5.81)
RDW: 14.2 % (ref 11.5–15.5)
WBC: 11.3 K/uL — ABNORMAL HIGH (ref 4.0–10.5)
nRBC: 0 % (ref 0.0–0.2)

## 2019-04-28 NOTE — Progress Notes (Signed)
Patient ID: Paul [name], male   DOB: 03-Jun-1955, 63 y.o.   MRN: [id number] [facility] Progress Note   Assessment/ Plan:   1.  Fever, leukocytosis and deconditioning: Afebrile since admission on broad-spectrum antimicrobial coverage that is now being narrowed down.  No definitive focus of infection and culture data negative to date.  CMV PCR negative.  EBV pending.  No clear focus of infection. 2.  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    Continue current immunosuppressive medications and restart diuretic today-excellent urine output with initiation of torsemide.  Stable renal allograft function. 3.  Nausea/vomiting:  Resolved following admission.  Will begin probiotic and as needed Imodium for diarrhea. 4.  Anemia of chronic disease: Reports recent (12/2) Aranesp administration.    Iron stores replete. 5.  Hypokalemia:  Potassium levels corrected.  Continues to have intermittent diarrhea.  Subjective:   Denies any acute events overnight, reports that he has been sitting up in recliner since 9:00 this morning and will have lunch there.   Objective:   BP (!) 141/69 (BP Location: Left Arm)   Pulse 63   Temp 98.2 F (36.8 C) (Oral)   Resp 16   Ht 6' (1.829 m)   Wt 83 kg   SpO2 99%   BMI 24.82 kg/m   Intake/Output Summary (Last 24 hours) at 04/28/2019 1141 Last data filed at 04/28/2019 0739 Gross per 24 hour  Intake -  Output 1225 ml  Net -1225 ml   Weight change:   Physical Exam: Gen: Appears comfortable sitting in recliner with bilateral lower extremity prosthesis CVS: Pulse regular bradycardia, normal rate, S1 and S2 normal Resp: Diminished breath sounds over bases, no distinct rales or rhonchi Abd: Soft, obese, nontender Ext: 1+ pitting edema bilateral BKA stumps, trace-1+ dependent edema, 1-2+ upper  extremity edema  Imaging: No results found.  Labs: BMET Recent Labs  Lab 04/23/19 1808 04/24/19 0520 04/25/19 0342 04/26/19 0337 04/27/19 0352 04/28/19 0211  NA 137 139 141 142 142 141  K 3.8 3.2* 3.1* 3.8 4.0 4.6  CL 100 106 107 109 108 105  CO2 25 25 26 25 26 26   GLUCOSE 390* 259* 129* 251* 198* 172*  BUN 49* 48* 49* 47* 45* 47*  CREATININE 4.25* 3.96* 3.94* 3.79* 3.52* 3.48*  CALCIUM 8.1* 7.6* 7.7* 7.9* 8.4* 8.4*  PHOS  --   --   --   --  1.5* 1.3*   CBC Recent Labs  Lab 04/23/19 1808  04/25/19 0342 04/26/19 0337 04/27/19 0352 04/28/19 0211  WBC 21.5*   < > 11.5* 8.8 9.4 11.3*  NEUTROABS 19.7*  --  8.7*  --   --   --   HGB 8.4*   < > 8.5* 8.3* 8.3* 8.5*  HCT 27.1*   < > 27.5* 27.6* 27.5* 26.7*  MCV 92.8   < > 93.5 95.5 93.5 91.1  PLT 112*   < > 87* 63* 75* 92*   < > = values in this interval not displayed.   Medications:    . acidophilus  1 capsule Oral Daily  . apixaban  5 mg Oral BID  . aspirin EC  81 mg Oral Daily  . atorvastatin  10 mg Oral Q M,W,F  . B-complex with vitamin C  1 tablet Oral Daily  . calcitRIOL  0.25 mcg Oral Daily  .  calcium acetate  1,334 mg Oral TID WC  . cholecalciferol  4,000 Units Oral Daily  . feeding supplement (GLUCERNA SHAKE)  237 mL Oral TID BM  . insulin aspart  0-5 Units Subcutaneous QHS  . insulin aspart  0-9 Units Subcutaneous TID WC  . insulin aspart  3 Units Subcutaneous TID WC  . insulin glargine  10 Units Subcutaneous Daily  . magnesium chloride  2 tablet Oral BID  . mycophenolate  360 mg Oral BID  . pantoprazole  40 mg Oral Daily  . predniSONE  5 mg Oral Q breakfast  . sodium bicarbonate  1,950 mg Oral QID  . tacrolimus  3 mg Oral Q12H  . tamsulosin  0.4 mg Oral QPC supper  . torsemide  40 mg Oral Daily   Elmarie Shiley, MD 04/28/2019, 11:41 AM

## 2019-04-28 NOTE — Progress Notes (Signed)
PROGRESS NOTE    Paul [name]  JTT:[id number] DOB: 12/15/1955 DOA: 04/23/2019 PCP: [name], MD    Brief Narrative:  63 year old male who presented with fever.  He has significant past medical history of chronic kidney disease stage IV, status post renal transplant, anemia chronic kidney disease, atrial fibrillation, type 2 diabetes mellitus, hypertension, bilateral BKA, and history of duodenal adenocarcinoma status post resection.  Patient reported not feeling well for a few days, generalized weakness, fatigue, vomiting and poor oral intake.  On the day of his of admission his temperature reached 100.3 F.  Blood pressure was 127/66, heart rate 16, respiratory rate 20, oxygen saturation 97%, he had dry mucous membranes, his lungs were clear to auscultation bilaterally, heart S1-S2 present and rhythmic, the abdomen was soft, patient had positive edema in the dorsum of his hands bilaterally. Sodium 137, potassium 3.8, chloride 100, bicarb 25, glucose 390, BUN 49, creatinine 4.25, white cell count 21.5, hemoglobin 8.4, hematocrit 27.1, platelet 112.  COVID-19 was negative.  Urinalysis specific gravity 1.008.  CT of the abdomen with no acute changes.  His chest radiograph had bibasilar atelectasis.  EKG 69 bpm, normal axis, normal intervals, sinus rhythm, Q-wave V1 V2, no ST segment or T wave changes.  Patient was admitted to the hospital working diagnosis of fever and leukocytosis, complicated by acute kidney injury chronic increase.  Patient was placed on broad-spectrum antibiotic therapy, intravenous IV fluid and supportive medical therapy.  Clinically he has been improving, no source of infection has been identified.  Assessment & Plan:   Principal Problem:   Fever Active Problems:   Diabetes mellitus, type 2 (HCC)   AKI (acute kidney injury) ([place])   Hyperglycemia   Nausea and vomiting   1. Febrile syndrome. No signs of bacterial infection, wbc is trending down and patient has been  afebrile. CMV and EBV negative, COVID 19 negative. Will hold on antibiotic therapy for now and will continue to follow on cell count and temperature curve.   2. AKI on CKD stage IV/ hypokalemia, hypomagnesemia. Renal function is stable with serum cr at 3,48 with K at 4,6 and serum bicarbonate at 26. Urine output is 825 ml over last 24 H, documented, no signs of volume overload. Continue, oral bicarb, calcitriol and phoslo. Continue with torsemide with good toleration.   Post renal transplant. Continue immunosuppressive therapy with mycophenolate, tacrolimus and prednisone.   3. T2DM with hyperglycemia/ dyslipidemia. Will continue glucose cover and monitoring with insulin sliding scale. Basal insulin 10 units and pre-meal coverage. Patient is tolerating po well. Continue with statin therapy.   4. Chronic atrial fibrillation. Continue rate control. Continue anticoagulation with apixaban. On asa.   5. Diarrhea. Intermittent. Will stop broad spectrum antibiotic therapy.   6. Severe protein calorie malnutrition. Will continue with nutritional supplements.   7. Chronic anemia multifactorial with thrombocytopenia. Will continue to follow on cell count.   DVT prophylaxis: apixaban   Code Status:  Full Family Communication: no family at the bedside  Disposition Plan/ discharge barriers: pending clinical improvement.   Body mass index is 24.82 kg/m. Malnutrition Type:  Nutrition Problem: Inadequate oral intake Etiology: acute illness, chronic illness(fever and leukocytosis; h/o renal transplant, CKDIV; duodenal adenocarcinoma s/p resection)   Malnutrition Characteristics:  Signs/Symptoms: per patient/family report(po intake of small snacks 1-2/day x 3 weeks)   Nutrition Interventions:  Interventions: Glucerna shake, MVI  RN Pressure Injury Documentation:    Consultants:     Procedures:     Antimicrobials:  Subjective: Patient is feeling better, no nausea or  vomiting, no chest pain or dyspnea. Positive loose stools, no abdominal pain.   Objective: Vitals:   04/27/19 0442 04/27/19 1348 04/27/19 2027 04/28/19 0413  BP: (!) 145/71 125/69 (!) 149/69 (!) 141/69  Pulse: (!) 57 71 67 63  Resp: 16 18 20 16   Temp: 98.2 F (36.8 C) 98.5 F (36.9 C) 98.3 F (36.8 C) 98.2 F (36.8 C)  TempSrc: Oral Oral Oral Oral  SpO2: 99% 99% 100% 99%  Weight:      Height:        Intake/Output Summary (Last 24 hours) at 04/28/2019 1122 Last data filed at 04/28/2019 2703 Gross per 24 hour  Intake -  Output 1225 ml  Net -1225 ml   Filed Weights   04/23/19 1745  Weight: 83 kg    Examination:   General: Not in pain or dyspnea, deconditioned  Neurology: Awake and alert, non focal  E ENT: mild pallor, no icterus, oral mucosa moist Cardiovascular: No JVD. S1-S2 present, rhythmic, no gallops, rubs, or murmurs. No lower extremity edema. Pulmonary: positive breath sounds bilaterally, adequate air movement, no wheezing, rhonchi or rales. Gastrointestinal. Abdomen with no organomegaly, non tender, no rebound or guarding Skin. No rashes Musculoskeletal: bilateral BKA.      Data Reviewed: I have personally reviewed following labs and imaging studies  CBC: Recent Labs  Lab 04/23/19 1808 04/24/19 0520 04/25/19 0342 04/26/19 0337 04/27/19 0352 04/28/19 0211  WBC 21.5* 17.9* 11.5* 8.8 9.4 11.3*  NEUTROABS 19.7*  --  8.7*  --   --   --   HGB 8.4* 8.7* 8.5* 8.3* 8.3* 8.5*  HCT 27.1* 28.1* 27.5* 27.6* 27.5* 26.7*  MCV 92.8 93.4 93.5 95.5 93.5 91.1  PLT 112* 98* 87* 63* 75* 92*   Basic Metabolic Panel: Recent Labs  Lab 04/24/19 0520 04/25/19 0342 04/26/19 0337 04/27/19 0352 04/28/19 0211  NA 139 141 142 142 141  K 3.2* 3.1* 3.8 4.0 4.6  CL 106 107 109 108 105  CO2 25 26 25 26 26   GLUCOSE 259* 129* 251* 198* 172*  BUN 48* 49* 47* 45* 47*  CREATININE 3.96* 3.94* 3.79* 3.52* 3.48*  CALCIUM 7.6* 7.7* 7.9* 8.4* 8.4*  MG 1.6*  --   --   --   --    PHOS  --   --   --  1.5* 1.3*   GFR: Estimated Creatinine Clearance: 23.2 mL/min (A) (by C-G formula based on SCr of 3.48 mg/dL (H)). Liver Function Tests: Recent Labs  Lab 04/23/19 1808 04/25/19 0342 04/27/19 0352 04/28/19 0211  AST 24 11*  --   --   ALT 12 9  --   --   ALKPHOS 78 61  --   --   BILITOT 0.9 0.5  --   --   PROT 3.9* 3.5*  --   --   ALBUMIN 1.9* 1.6* 1.6* 1.7*   Recent Labs  Lab 04/23/19 1808  LIPASE 14   No results for input(s): AMMONIA in the last 168 hours. Coagulation Profile: No results for input(s): INR, PROTIME in the last 168 hours. Cardiac Enzymes: No results for input(s): CKTOTAL, CKMB, CKMBINDEX, TROPONINI in the last 168 hours. BNP (last 3 results) No results for input(s): PROBNP in the last 8760 hours. HbA1C: No results for input(s): HGBA1C in the last 72 hours. CBG: Recent Labs  Lab 04/27/19 0757 04/27/19 1216 04/27/19 1728 04/27/19 2029 04/28/19 0737  GLUCAP 171* 251* 320* 281* 169*  Lipid Profile: No results for input(s): CHOL, HDL, LDLCALC, TRIG, CHOLHDL, LDLDIRECT in the last 72 hours. Thyroid Function Tests: No results for input(s): TSH, T4TOTAL, FREET4, T3FREE, THYROIDAB in the last 72 hours. Anemia Panel: No results for input(s): VITAMINB12, FOLATE, FERRITIN, TIBC, IRON, RETICCTPCT in the last 72 hours.    Radiology Studies: I have reviewed all of the imaging during this hospital visit personally     Scheduled Meds: . acidophilus  1 capsule Oral Daily  . apixaban  5 mg Oral BID  . aspirin EC  81 mg Oral Daily  . atorvastatin  10 mg Oral Q M,W,F  . B-complex with vitamin C  1 tablet Oral Daily  . calcitRIOL  0.25 mcg Oral Daily  . calcium acetate  1,334 mg Oral TID WC  . cholecalciferol  4,000 Units Oral Daily  . feeding supplement (GLUCERNA SHAKE)  237 mL Oral TID BM  . insulin aspart  0-5 Units Subcutaneous QHS  . insulin aspart  0-9 Units Subcutaneous TID WC  . insulin aspart  3 Units Subcutaneous TID WC  .  insulin glargine  10 Units Subcutaneous Daily  . magnesium chloride  2 tablet Oral BID  . mycophenolate  360 mg Oral BID  . pantoprazole  40 mg Oral Daily  . predniSONE  5 mg Oral Q breakfast  . sodium bicarbonate  1,950 mg Oral QID  . tacrolimus  3 mg Oral Q12H  . tamsulosin  0.4 mg Oral QPC supper  . torsemide  40 mg Oral Daily   Continuous Infusions: . ceFEPime (MAXIPIME) IV 2 g (04/27/19 2236)  . vancomycin 1,250 mg (04/27/19 2312)     LOS: 4 days        Mauricio Gerome Apley, MD

## 2019-04-28 NOTE — Progress Notes (Signed)
Physical Therapy Treatment Patient Details Name: Paul [name] MRN: [id number] DOB: 05/29/1955 Today's Date: 04/28/2019    History of Present Illness Pt is a 63 y.o. male with medical history significant of renal transplant, CKD stage IV, calciphylaxis, anemia of chronic disease, A. Fib, duodenal adenocarcinoma status post resection, insulin-dependent diabetes mellitus, diabetic neuropathy, status post bilateral BKA, hypertension presenting to the ED for evaluation of fever, nausea/vomiting, and hyperglycemia.    PT Comments    Pt making steady progress with functional mobility. He tolerated further total ambulation distance than previous sessions and with no LOB or need for physical assistance. Pt would continue to benefit from skilled physical therapy services at this time while admitted and after d/c to address the below listed limitations in order to improve overall safety and independence with functional mobility.   Follow Up Recommendations  Home health PT;Supervision - Intermittent     Equipment Recommendations  None recommended by PT    Recommendations for Other Services       Precautions / Restrictions Precautions Precautions: Fall Precaution Comments: B LE prosthetics Restrictions Weight Bearing Restrictions: No    Mobility  Bed Mobility               General bed mobility comments: pt OOB in recliner chair upon arrival  Transfers Overall transfer level: Needs assistance Equipment used: Rolling walker (2 wheeled) Transfers: Sit to/from Stand Sit to Stand: Min guard         General transfer comment: from recliner chair; performed x2 with min guard using good technique with RW  Ambulation/Gait Ambulation/Gait assistance: Min guard Gait Distance (Feet): 75 Feet(75' then another 39' after sitting rest break) Assistive device: Rolling walker (2 wheeled) Gait Pattern/deviations: Step-through pattern;Decreased step length - right;Decreased step length -  left Gait velocity: decreased   General Gait Details: pt overall steady with RW, no LOB or need for physical assistance, min guard for safety with slow, cautious gait   Stairs             Wheelchair Mobility    Modified Rankin (Stroke Patients Only)       Balance Overall balance assessment: Needs assistance Sitting-balance support: Feet supported Sitting balance-Leahy Scale: Good     Standing balance support: Bilateral upper extremity supported;During functional activity Standing balance-Leahy Scale: Poor                              Cognition Arousal/Alertness: Awake/alert Behavior During Therapy: WFL for tasks assessed/performed Overall Cognitive Status: Within Functional Limits for tasks assessed                                        Exercises      General Comments        Pertinent Vitals/Pain Pain Assessment: No/denies pain    Home Living                      Prior Function            PT Goals (current goals can now be found in the care plan section) Acute Rehab PT Goals PT Goal Formulation: With patient Time For Goal Achievement: 05/09/19 Potential to Achieve Goals: Good Progress towards PT goals: Progressing toward goals    Frequency    Min 3X/week      PT Plan Current plan  remains appropriate    Co-evaluation              AM-PAC PT "6 Clicks" Mobility   Outcome Measure  Help needed turning from your back to your side while in a flat bed without using bedrails?: None Help needed moving from lying on your back to sitting on the side of a flat bed without using bedrails?: A Little Help needed moving to and from a bed to a chair (including a wheelchair)?: A Little Help needed standing up from a chair using your arms (e.g., wheelchair or bedside chair)?: A Little Help needed to walk in hospital room?: A Little Help needed climbing 3-5 steps with a railing? : A Little 6 Click Score: 19     End of Session Equipment Utilized During Treatment: Gait belt Activity Tolerance: Patient tolerated treatment well Patient left: in chair;with call bell/phone within reach Nurse Communication: Mobility status PT Visit Diagnosis: Muscle weakness (generalized) (M62.81);Other abnormalities of gait and mobility (R26.89)     Time: 0375-4360 PT Time Calculation (min) (ACUTE ONLY): 23 min  Charges:  $Gait Training: 23-37 mins                     [name], DPT  Acute Rehabilitation Services Pager [phone] Office [place] 04/28/2019, 11:29 AM

## 2019-04-28 NOTE — Progress Notes (Signed)
OT Cancellation Note  Patient Details Name: Paul [name] MRN: [id number] DOB: 1955-09-20   Cancelled Treatment:    Reason Eval/Treat Not Completed: Medical issues which prohibited therapy;Other (comment) Pt reports feeling dizzy and lightheaded after walking with PT. Pt politely declined OT session this afternoon. Will check back as time allows.  Lanier Clam., COTA/L Acute Rehabilitation Services [phone] [place] 04/28/2019, 2:10 PM

## 2019-04-29 DIAGNOSIS — E7800 Pure hypercholesterolemia, unspecified: Secondary | ICD-10-CM

## 2019-04-29 DIAGNOSIS — Z89512 Acquired absence of left leg below knee: Secondary | ICD-10-CM

## 2019-04-29 DIAGNOSIS — Z89511 Acquired absence of right leg below knee: Secondary | ICD-10-CM

## 2019-04-29 DIAGNOSIS — N184 Chronic kidney disease, stage 4 (severe): Secondary | ICD-10-CM

## 2019-04-29 DIAGNOSIS — I480 Paroxysmal atrial fibrillation: Secondary | ICD-10-CM

## 2019-04-29 DIAGNOSIS — E1122 Type 2 diabetes mellitus with diabetic chronic kidney disease: Secondary | ICD-10-CM

## 2019-04-29 LAB — RENAL FUNCTION PANEL
Albumin: 1.7 g/dL — ABNORMAL LOW (ref 3.5–5.0)
Anion gap: 7 (ref 5–15)
BUN: 47 mg/dL — ABNORMAL HIGH (ref 8–23)
CO2: 28 mmol/L (ref 22–32)
Calcium: 8.5 mg/dL — ABNORMAL LOW (ref 8.9–10.3)
Chloride: 105 mmol/L (ref 98–111)
Creatinine, Ser: 3.72 mg/dL — ABNORMAL HIGH (ref 0.61–1.24)
GFR calc Af Amer: 19 mL/min — ABNORMAL LOW (ref >60)
GFR calc non Af Amer: 16 mL/min — ABNORMAL LOW (ref >60)
Glucose, Bld: 177 mg/dL — ABNORMAL HIGH (ref 70–99)
Phosphorus: 1.5 mg/dL — ABNORMAL LOW (ref 2.5–4.6)
Potassium: 4.3 mmol/L (ref 3.5–5.1)
Sodium: 140 mmol/L (ref 135–145)

## 2019-04-29 LAB — CULTURE, BLOOD (ROUTINE X 2)
Culture: NO GROWTH
Culture: NO GROWTH
Special Requests: ADEQUATE
Special Requests: ADEQUATE

## 2019-04-29 LAB — GLUCOSE, CAPILLARY
Glucose-Capillary: 133 mg/dL — ABNORMAL HIGH (ref 70–99)
Glucose-Capillary: 213 mg/dL — ABNORMAL HIGH (ref 70–99)

## 2019-04-29 MED ORDER — GLUCERNA SHAKE PO LIQD: 237.0000 mL | Freq: Three times a day (TID) | ORAL | 0 refills | Status: AC

## 2019-04-29 MED ORDER — TORSEMIDE 20 MG PO TABS: 40.0000 mg | ORAL_TABLET | Freq: Every day | ORAL | 0 refills | Status: DC

## 2019-04-29 NOTE — Progress Notes (Signed)
Occupational Therapy Treatment Patient Details Name: Paul [name] MRN: [id number] DOB: 04/08/1956 Today's Date: 04/29/2019    History of present illness Pt is a 63 y.o. male with medical history significant of renal transplant, CKD stage IV, calciphylaxis, anemia of chronic disease, A. Fib, duodenal adenocarcinoma status post resection, insulin-dependent diabetes mellitus, diabetic neuropathy, status post bilateral BKA, hypertension presenting to the ED for evaluation of fever, nausea/vomiting, and hyperglycemia.   OT comments  Patient seated in chair upon arrival, agreeable to OT and wanting to wash his hair. Patient supervision for transfers and ambulation using rolling walker, supervision for bathing and set up to doff/don protheses. Educate patient on ways to elevate shower seat height at home by adding wheelchair cushion or firm cushion with dycem or self liner. Patient is afraid if he tries to raise the legs the bench may not fit in the shower stall.    Follow Up Recommendations  No OT follow up;Supervision/Assistance - 24 hour;Other (comment)(patient expresses wanting OP PT )    Equipment Recommendations  None recommended by OT       Precautions / Restrictions Precautions Precautions: Fall Precaution Comments: B LE prosthetics Restrictions Weight Bearing Restrictions: No       Mobility Bed Mobility               General bed mobility comments: pt OOB in recliner chair upon arrival  Transfers Overall transfer level: Needs assistance Equipment used: Rolling walker (2 wheeled) Transfers: Sit to/from Stand Sit to Stand: Supervision         General transfer comment: demonstrates adequate safety awareness with body mechanics with shower transfer and to recliner    Balance Overall balance assessment: Needs assistance Sitting-balance support: Feet supported Sitting balance-Leahy Scale: Good     Standing balance support: Bilateral upper extremity supported;During  functional activity Standing balance-Leahy Scale: Poor Standing balance comment: reliance on walker for stability                           ADL either performed or assessed with clinical judgement   ADL Overall ADL's : Needs assistance/impaired         Upper Body Bathing: Supervision/ safety;Sitting Upper Body Bathing Details (indicate cue type and reason): patient wash UB and hair seated on 3 in 1         Lower Body Dressing: Set up;Sitting/lateral leans Lower Body Dressing Details (indicate cue type and reason): doff/don protheses         Tub/ Shower Transfer: Walk-in shower;Supervision/safety;3 in [employer] certified [employer] Details (indicate cue type and reason): supervision for safety negotiating walker in bathroom   General ADL Comments: education shower transfer training with recommendations for use of w/c cushion + dycem to lift seat height. patient concerned he won't be able to raise shower chair and it still fit inside shower stall.                Cognition Arousal/Alertness: Awake/alert Behavior During Therapy: WFL for tasks assessed/performed Overall Cognitive Status: Within Functional Limits for tasks assessed                                                General Comments patient participate in functional ambulation in hallway for approx 147ft without loss of balance using rolling walker at supervision.  Pertinent Vitals/ Pain       Pain Assessment: No/denies pain     Prior Functioning/Environment              Frequency  Min 2X/week        Progress Toward Goals  OT Goals(current goals can now be found in the care plan section)  Progress towards OT goals: Progressing toward goals  Acute Rehab OT Goals Patient Stated Goal: to get stronger OT Goal Formulation: With patient Time For Goal Achievement: 05/10/19 Potential to Achieve Goals: Good ADL Goals Pt Will Perform Lower Body  Dressing: with modified independence;bed level;sit to/from stand;sitting/lateral leans Pt Will Transfer to Toilet: with modified independence;ambulating Pt Will Perform Toileting - Clothing Manipulation and hygiene: with modified independence;sitting/lateral leans;sit to/from stand Pt Will Perform Tub/Shower Transfer: Shower transfer;shower seat;grab bars;rolling walker;ambulating  Plan Discharge plan updated       AM-PAC OT "6 Clicks" Daily Activity     Outcome Measure   Help from another person eating meals?: None Help from another person taking care of personal grooming?: A Little Help from another person toileting, which includes using toliet, bedpan, or urinal?: A Little Help from another person bathing (including washing, rinsing, drying)?: A Little Help from another person to put on and taking off regular upper body clothing?: A Little Help from another person to put on and taking off regular lower body clothing?: A Little 6 Click Score: 19    End of Session Equipment Utilized During Treatment: Gait belt;Rolling walker  OT Visit Diagnosis: Unsteadiness on feet (R26.81)   Activity Tolerance Patient tolerated treatment well   Patient Left in chair;with call bell/phone within reach   Nurse Communication Mobility status        Time: 4132-4401 OT Time Calculation (min): 38 min  Charges: OT General Charges $OT Visit: 1 Visit OT Treatments $Self Care/Home Management : 38-52 mins  Paul [name] OT OT office: [place] 04/29/2019, 12:30 PM

## 2019-04-29 NOTE — Progress Notes (Signed)
Patient discharged to home. Verbalized understanding of all discharge instructions including discharge medications and follow up MD visits. 

## 2019-04-29 NOTE — Discharge Summary (Signed)
Physician Discharge Summary  Paul [name] PJA:[id number] DOB: [birth year] DOA: 04/23/2019  PCP: [name], MD  Admit date: 04/23/2019 Discharge date: 04/29/2019  Admitted From: Home  Disposition:  Home   Recommendations for Outpatient Follow-up and new medication changes:  1. Follow up with Dr. Dorthy Cooler in 7 days.  2. Furosemide has been changed to torsemide.   Home Health: no   Equipment/Devices: no    Discharge Condition: stable  CODE STATUS: full  Diet recommendation: heart healthy, renal and diabetic prudent,   Brief/Interim Summary: 63 year old male who presented with fever.  He has significant past medical history of chronic kidney disease stage IV, status post renal transplant, anemia of chronic kidney disease, atrial fibrillation, type 2 diabetes mellitus, hypertension, bilateral BKA, and history of duodenal adenocarcinoma status post resection.  Patient reported not feeling well for a few days, generalized weakness, fatigue, vomiting and poor oral intake.  On the day of his of admission his temperature reached 100.3 F.  On hid initial physical examination his blood pressure was 127/66, heart rate 16, respiratory rate 20, oxygen saturation 97%, he had dry mucous membranes, his lungs were clear to auscultation bilaterally, heart S1-S2 present and rhythmic, the abdomen was soft, patient had positive edema in the dorsum of his hands bilaterally. Sodium 137, potassium 3.8, chloride 100, bicarb 25, glucose 390, BUN 49, creatinine 4.25, white cell count 21.5, hemoglobin 8.4, hematocrit 27.1, platelet 112.  COVID-19 was negative.  Urinalysis specific gravity 1.008.  CT of the abdomen with no acute changes.  His chest radiograph had bibasilar atelectasis.  EKG 69 bpm, normal axis, normal intervals, sinus rhythm, Q-wave V1 V2, no ST segment or T wave changes.  Patient was admitted to the hospital working diagnosis of fever and leukocytosis, complicated by acute kidney injury chronic kidney  disease.   Patient was placed on broad-spectrum antibiotic therapy, intravenous IV fluids and supportive medical therapy.  Clinically he has been improving, no source of infection has been identified and antibiotics were discontinued, no signs of bacterial infection.   1.  Febrile syndrome.  Ruled out for bacterial infection.  Patient was admitted to the medical ward, he received initially broad-spectrum IV antibiotic therapy with vancomycin, cefepime and metronidazole.  His blood cultures remain no growth, viral panel including COVID-19, CMV and EBV were negative.  CT of the abdomen and pelvis showed small left pleural effusion with bibasilar atelectasis, fatty liver infiltration, transplanted kidney in the iliac fossa without evidence of mass or hydronephrosis.  No acute intra-abdominal or intrapelvic abnormalities.  Patient remained afebrile, antibiotic therapy was discontinued with good toleration.  2.  Acute kidney injury chronic disease stage IV, hypokalemia/hypomagnesemia-status post renal transplant..  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    Patient initially received IV fluids, with stabilization of his kidney function, then he received diuresis with torsemide with good urine output.  Patient will be discharged on torsemide and follow-up as an outpatient in the nephrology clinic.  Patient was continued on immunosuppressive therapy with mycophenolate, tacrolimus and prednisone.   His discharge creatinine was 3.72, sodium 140, potassium 4.3, chloride 105, bicarb 28, BUN 47.  3.  Uncontrolled type 2 diabetes mellitus, hemoglobin A1c 8.1.  Dyslipidemia.  Patient received insulin therapy including basal, premeal and sliding scale.  Continue statin therapy.  4.  Chronic atrial fibrillation.  Patient remained rate controlled, continue anticoagulation with apixaban.  5.  Severe protein calorie  malnutrition.  Continue nutritional supplements.  6.  Chronic anemia, multifactorial with thrombocytopenia.  Cell count remained stable, follow-up as an outpatient.  Discharge Diagnoses:  Principal Problem:   Fever Active Problems:   Atrial fibrillation (HCC)   S/P BKA (below knee amputation) bilateral (HCC)   Diabetes mellitus, type 2 (HCC)   Hypercholesteremia   AKI (acute kidney injury) ([place])   Hyperglycemia   Nausea and vomiting   CKD stage 4 due to type 2 diabetes mellitus [facility])    Discharge Instructions  Discharge Instructions    Ambulatory referral to Physical Therapy   Complete by: As directed    Iontophoresis - 4 mg/ml of dexamethasone: No   T.E.N.S. Unit Evaluation and Dispense as Indicated: No     Allergies as of 04/29/2019      Reactions   Metformin And Related Other (See Comments)   Has kidney problems      Medication List    STOP taking these medications   furosemide 80 MG tablet Commonly known as: LASIX     TAKE these medications   amLODipine 10 MG tablet Commonly known as: NORVASC Take 10 mg by mouth daily.   apixaban 5 MG Tabs tablet Commonly known as: Eliquis Take 1 tablet (5 mg total) by mouth 2 (two) times daily.   aspirin EC 81 MG tablet Take 81 mg by mouth daily.   atorvastatin 10 MG tablet Commonly known as: LIPITOR Take 1 tablet (10 mg total) by mouth daily. What changed: when to take this   Basaglar KwikPen 100 UNIT/ML Sopn Inject 10 Units into the skin daily.   calcitRIOL 0.25 MCG capsule Commonly known as: ROCALTROL Take 0.25 mcg by mouth daily.   [employer] D3 50 MCG (2000 UT) Caps Generic drug: Cholecalciferol Take 4,000 Units by mouth daily.   feeding supplement (GLUCERNA SHAKE) Liqd Take 237 mLs by mouth 3 (three) times daily between meals.   HumaLOG KwikPen 100 UNIT/ML KwikPen Generic drug: insulin lispro Sliding Scale three times daily before meals   hydrALAZINE 50 MG tablet Commonly known as: APRESOLINE Take 50  mg by mouth 2 (two) times daily.   mycophenolate 180 MG EC tablet Commonly known as: MYFORTIC Take 360 mg by mouth 2 (two) times daily.   omeprazole 40 MG capsule Commonly known as: PRILOSEC Take 40 mg by mouth at bedtime.   PhosLo 667 MG capsule Generic drug: calcium acetate Take 1,334 mg by mouth 3 (three) times daily with meals.   predniSONE 5 MG tablet Commonly known as: DELTASONE Take 5 mg by mouth daily with breakfast.   Slow-Mag 64 MG Tbec SR tablet Generic drug: magnesium chloride Take 2 tablets by mouth 2 (two) times daily. TAKE AT LEAST 2 HOURS SEPARATE FROM PROGRAF/TACROLIMUS   sodium bicarbonate 650 MG tablet Take 1,950 mg by mouth 4 (four) times daily.   tacrolimus 1 MG capsule Commonly known as: PROGRAF Take 3 mg by mouth See admin instructions. Take 3 mg by mouth in the morning at 9 AM and 3 mg in the evening at 9 PM   tamsulosin 0.4 MG Caps capsule Commonly known as: FLOMAX Take 0.4 mg by mouth daily after supper.   torsemide 20 MG tablet Commonly known as: DEMADEX Take 2 tablets (40 mg total) by mouth daily.      Follow-up Information    [facility]. Schedule an appointment as soon as possible for a visit.   Specialty: Rehabilitation Contact information: [address] [place]  [place], [place] Follow up in 1 week(s).   Specialty: Family Medicine Contact information: [address] [place] [address] [phone]          Allergies  Allergen Reactions  . Metformin And Related Other (See Comments)    Has kidney problems    Consultations:  Nephrology    Procedures/Studies: CT Abdomen Pelvis Wo Contrast  Result Date: 04/23/2019 CLINICAL DATA:  Acute generalized abdominal pain, history end-stage renal disease on dialysis, type I diabetes mellitus, GERD, hypertension, atrial fibrillation EXAM: CT  ABDOMEN AND PELVIS WITHOUT CONTRAST TECHNIQUE: Multidetector CT imaging of the abdomen and pelvis was performed following the standard protocol without IV contrast. Sagittal and coronal MPR images reconstructed from axial data set. No oral contrast. COMPARISON:  02/15/2019 FINDINGS: Lower chest: Small LEFT pleural effusion. Dependent bibasilar atelectasis. Minimal pericardial fluid. Hepatobiliary: Fatty infiltration of liver. Gallbladder surgically absent. Chronic pneumobilia. No definite hepatic mass. Pancreas: Atrophic Spleen: Normal appearance Adrenals/Urinary Tract: Adrenal glands normal appearance. Atrophic native kidneys containing calculi and renal vascular calcifications. Transplant kidney RIGHT iliac fossa. No evidence of renal mass or hydronephrosis. Bladder well distended. Stomach/Bowel: Fluid within stomach with questionable gastric wall thickening versus artifact from underdistention. Large and small bowel loops unremarkable. Vascular/Lymphatic: Extensive atherosclerotic calcifications of both large and small vessels. Aorta normal caliber. Multiple pelvic phleboliths. No adenopathy. Reproductive: Mild prostatic enlargement gland 4.6 x 3.9 cm. Other: Significant ascites. Scattered soft tissue edema. No free air or hernia. Musculoskeletal: Osseous demineralization. IMPRESSION: Scattered ascites and soft tissue edema, nonspecific. Small LEFT pleural effusion with bibasilar atelectasis. Fatty infiltration of liver. Transplant kidney RIGHT iliac fossa without evidence of mass or hydronephrosis. Mild prostatic enlargement. Chronic pneumobilia suspect from prior biliary intervention. No other acute intra-abdominal or intrapelvic abnormalities. Aortic Atherosclerosis (ICD10-I70.0). Electronically Signed   By: Lavonia Dana M.D.   On: 04/23/2019 21:00   US RENAL  Result Date: 04/24/2019 CLINICAL DATA:  Initial evaluation for elevated BUN and creatinine, acute renal injury. EXAM: RENAL / URINARY TRACT  ULTRASOUND COMPLETE COMPARISON:  Prior CT from 04/23/2019. FINDINGS: Right Kidney: Renal measurements: 8.0 x 2.6 x 4.0 cm = volume: 42.7 mL. Right kidney atrophic in appearance with diffuse cortical thinning and increased echogenicity within the renal parenchyma. 9 mm shadowing stone present at the upper pole. No hydronephrosis. No focal renal mass. Left Kidney: Renal measurements: 8.6 x 5.0 x 5.3 cm = volume: 116.0 mL. Left kidney atrophic in appearance with diffuse cortical thinning and increased echogenicity within the renal parenchyma. No visible nephrolithiasis or hydronephrosis. No focal renal mass. Bladder: Appears normal for degree of bladder distention. Prostate measures 4.5 x 2.5 x 4.8 cm. Other: Small volume ascites noted within the visualized abdomen. Increased echogenicity noted within the visualized liver, suggesting steatosis. IMPRESSION: 1. Renal atrophy with diffuse cortical thinning and increased echogenicity within the renal parenchyma, compatible with chronic medical renal disease. 2. No hydronephrosis. 3. 9 mm nonobstructive right renal nephrolithiasis. 4. Small volume ascites. 5. Hepatic steatosis. Electronically Signed   By: Jeannine Boga M.D.   On: 04/24/2019 04:27   DG Chest Port 1 View  Result Date: 04/23/2019 CLINICAL DATA:  Fever EXAM: PORTABLE CHEST 1 VIEW COMPARISON:  None. FINDINGS: The heart size and mediastinal contours are within normal limits. Both lungs are clear. The visualized skeletal structures are unremarkable. IMPRESSION: No active disease. Electronically Signed   By: Kerby Moors M.D.   On: 04/23/2019 20:15  Procedures:   Subjective: Patient is feeling better, diarrhea has improved, no nausea or vomiting and tolerating po well.   Discharge Exam: Vitals:   04/28/19 2125 04/29/19 0549  BP: (!) 161/70 137/66  Pulse: (!) 57 (!) 57  Resp: 18 18  Temp: 98 F (36.7 C) 98 F (36.7 C)  SpO2: 98% 99%   Vitals:   04/28/19 0413 04/28/19 1448  04/28/19 2125 04/29/19 0549  BP: (!) 141/69 115/69 (!) 161/70 137/66  Pulse: 63 67 (!) 57 (!) 57  Resp: 16 17 18 18   Temp: 98.2 F (36.8 C) (!) 97.4 F (36.3 C) 98 F (36.7 C) 98 F (36.7 C)  TempSrc: Oral Oral Oral Oral  SpO2: 99% 97% 98% 99%  Weight:      Height:        General: Not in pain or dyspnea.  Neurology: Awake and alert, non focal  E ENT: mild pallor, no icterus, oral mucosa moist Cardiovascular: No JVD. S1-S2 present, rhythmic, no gallops, rubs, or murmurs. No lower extremity edema. Pulmonary: positive breath sounds bilaterally, adequate air movement, no wheezing, rhonchi or rales. Gastrointestinal. Abdomen with no organomegaly, non tender, no rebound or guarding Skin. No rashes Musculoskeletal: bilateral BKA.    The results of significant diagnostics from this hospitalization (including imaging, microbiology, ancillary and laboratory) are listed below for reference.     Microbiology: Recent Results (from the past 240 hour(s))  SARS CORONAVIRUS 2 (TAT 6-24 HRS) Nasopharyngeal Nasopharyngeal Swab     Status: None   Collection Time: 04/23/19 11:53 PM   Specimen: Nasopharyngeal Swab  Result Value Ref Range Status   SARS Coronavirus 2 NEGATIVE NEGATIVE Final    Comment: (NOTE) SARS-CoV-2 target nucleic acids are NOT DETECTED. The SARS-CoV-2 RNA is generally detectable in upper and lower respiratory specimens during the acute phase of infection. Negative results do not preclude SARS-CoV-2 infection, do not rule out co-infections with other pathogens, and should not be used as the sole basis for treatment or other patient management decisions. Negative results must be combined with clinical observations, patient history, and epidemiological information. The expected result is Negative. Fact Sheet for Patients: SugarRoll.be Fact Sheet for Healthcare Providers: https://www.woods-mathews.com/ This test is not yet approved or  cleared by the [place] FDA and  has been authorized for detection and/or diagnosis of SARS-CoV-2 by FDA under an Emergency Use Authorization (EUA). This EUA will remain  in effect (meaning this test can be used) for the duration of the COVID-19 declaration under Section 56 4(b)(1) of the Act, 21 U.S.C. section 360bbb-3(b)(1), unless the authorization is terminated or revoked sooner. Performed at [facility], [place] [address] [place], [place] [address]   Culture, blood (routine x 2)     Status: None (Preliminary result)   Collection Time: 04/23/19 11:53 PM   Specimen: BLOOD  Result Value Ref Range Status   Specimen Description BLOOD LEFT FOREARM  Final   Special Requests   Final    BOTTLES DRAWN AEROBIC AND ANAEROBIC Blood Culture adequate volume   Culture   Final    NO GROWTH 4 DAYS Performed at [facility], [place] [address], [place], [place] [address]    Report Status PENDING  Incomplete  Culture, blood (routine x 2)     Status: None (Preliminary result)   Collection Time: 04/23/19 11:55 PM   Specimen: BLOOD  Result Value Ref Range Status   Specimen Description BLOOD LEFT FOREARM  Final   Special Requests  Final    BOTTLES DRAWN AEROBIC AND ANAEROBIC Blood Culture adequate volume   Culture   Final    NO GROWTH 4 DAYS Performed at [facility], [place] [address] [place], [place] [address]    Report Status PENDING  Incomplete     Labs: BNP (last 3 results) No results for input(s): BNP in the last 8760 hours. Basic Metabolic Panel: Recent Labs  Lab 04/24/19 0520 04/25/19 0342 04/26/19 0337 04/27/19 0352 04/28/19 0211 04/29/19 0156  NA 139 141 142 142 141 140  K 3.2* 3.1* 3.8 4.0 4.6 4.3  CL 106 107 109 108 105 105  CO2 25 26 25 26 26 28   GLUCOSE 259* 129* 251* 198* 172* 177*  BUN 48* 49* 47* 45* 47* 47*  CREATININE 3.96* 3.94* 3.79* 3.52* 3.48* 3.72*  CALCIUM 7.6* 7.7* 7.9* 8.4* 8.4* 8.5*  MG 1.6*  --   --   --   --   --   PHOS  --   --    --  1.5* 1.3* 1.5*   Liver Function Tests: Recent Labs  Lab 04/23/19 1808 04/25/19 0342 04/27/19 0352 04/28/19 0211 04/29/19 0156  AST 24 11*  --   --   --   ALT 12 9  --   --   --   ALKPHOS 78 61  --   --   --   BILITOT 0.9 0.5  --   --   --   PROT 3.9* 3.5*  --   --   --   ALBUMIN 1.9* 1.6* 1.6* 1.7* 1.7*   Recent Labs  Lab 04/23/19 1808  LIPASE 14   No results for input(s): AMMONIA in the last 168 hours. CBC: Recent Labs  Lab 04/23/19 1808 04/24/19 0520 04/25/19 0342 04/26/19 0337 04/27/19 0352 04/28/19 0211  WBC 21.5* 17.9* 11.5* 8.8 9.4 11.3*  NEUTROABS 19.7*  --  8.7*  --   --   --   HGB 8.4* 8.7* 8.5* 8.3* 8.3* 8.5*  HCT 27.1* 28.1* 27.5* 27.6* 27.5* 26.7*  MCV 92.8 93.4 93.5 95.5 93.5 91.1  PLT 112* 98* 87* 63* 75* 92*   Cardiac Enzymes: No results for input(s): CKTOTAL, CKMB, CKMBINDEX, TROPONINI in the last 168 hours. BNP: Invalid input(s): POCBNP CBG: Recent Labs  Lab 04/28/19 0737 04/28/19 1154 04/28/19 1630 04/28/19 2104 04/29/19 0803  GLUCAP 169* 224* 285* 264* 133*   D-Dimer No results for input(s): DDIMER in the last 72 hours. Hgb A1c No results for input(s): HGBA1C in the last 72 hours. Lipid Profile No results for input(s): CHOL, HDL, LDLCALC, TRIG, CHOLHDL, LDLDIRECT in the last 72 hours. Thyroid function studies No results for input(s): TSH, T4TOTAL, T3FREE, THYROIDAB in the last 72 hours.  Invalid input(s): FREET3 Anemia work up No results for input(s): VITAMINB12, FOLATE, FERRITIN, TIBC, IRON, RETICCTPCT in the last 72 hours. Urinalysis    Component Value Date/Time   COLORURINE YELLOW 04/24/2019 0520   APPEARANCEUR CLEAR 04/24/2019 0520   LABSPEC 1.008 04/24/2019 0520   PHURINE 5.0 04/24/2019 0520   GLUCOSEU 50 (A) 04/24/2019 0520   HGBUR NEGATIVE 04/24/2019 0520   BILIRUBINUR NEGATIVE 04/24/2019 0520   KETONESUR NEGATIVE 04/24/2019 0520   PROTEINUR NEGATIVE 04/24/2019 0520   UROBILINOGEN 0.2 08/02/2014 1424   NITRITE  NEGATIVE 04/24/2019 0520   LEUKOCYTESUR NEGATIVE 04/24/2019 0520   Sepsis Labs Invalid input(s): PROCALCITONIN,  WBC,  LACTICIDVEN Microbiology Recent Results (from the past 240 hour(s))  SARS CORONAVIRUS 2 (TAT 6-24 HRS) Nasopharyngeal  Nasopharyngeal Swab     Status: None   Collection Time: 04/23/19 11:53 PM   Specimen: Nasopharyngeal Swab  Result Value Ref Range Status   SARS Coronavirus 2 NEGATIVE NEGATIVE Final    Comment: (NOTE) SARS-CoV-2 target nucleic acids are NOT DETECTED. The SARS-CoV-2 RNA is generally detectable in upper and lower respiratory specimens during the acute phase of infection. Negative results do not preclude SARS-CoV-2 infection, do not rule out co-infections with other pathogens, and should not be used as the sole basis for treatment or other patient management decisions. Negative results must be combined with clinical observations, patient history, and epidemiological information. The expected result is Negative. Fact Sheet for Patients: SugarRoll.be Fact Sheet for Healthcare Providers: https://www.woods-mathews.com/ This test is not yet approved or cleared by the [place] FDA and  has been authorized for detection and/or diagnosis of SARS-CoV-2 by FDA under an Emergency Use Authorization (EUA). This EUA will remain  in effect (meaning this test can be used) for the duration of the COVID-19 declaration under Section 56 4(b)(1) of the Act, 21 U.S.C. section 360bbb-3(b)(1), unless the authorization is terminated or revoked sooner. Performed at [facility], [place] [address], [place], [place] [address]   Culture, blood (routine x 2)     Status: None (Preliminary result)   Collection Time: 04/23/19 11:53 PM   Specimen: BLOOD  Result Value Ref Range Status   Specimen Description BLOOD LEFT FOREARM  Final   Special Requests   Final    BOTTLES DRAWN AEROBIC AND ANAEROBIC Blood Culture adequate volume    Culture   Final    NO GROWTH 4 DAYS Performed at [facility], [place] [address], [name], [place] [address]    Report Status PENDING  Incomplete  Culture, blood (routine x 2)     Status: None (Preliminary result)   Collection Time: 04/23/19 11:55 PM   Specimen: BLOOD  Result Value Ref Range Status   Specimen Description BLOOD LEFT FOREARM  Final   Special Requests   Final    BOTTLES DRAWN AEROBIC AND ANAEROBIC Blood Culture adequate volume   Culture   Final    NO GROWTH 4 DAYS Performed at [facility], [place] [address] [place], [place] [address]    Report Status PENDING  Incomplete     Time coordinating discharge: 45 minutes  SIGNED:   Tawni Millers, MD  [facility] 04/29/2019, 8:53 AM

## 2019-04-29 NOTE — Progress Notes (Signed)
Patient ID: Paul [name], male   DOB: 1956/04/30, 63 y.o.   MRN: [id number] [facility] Progress Note   Assessment/ Plan:   1.  Fever, leukocytosis and deconditioning: Afebrile since admission on broad-spectrum antimicrobial coverage that is now being narrowed down.  No definitive focus of infection and culture data negative to date.  CMV and EBV PCR negative.  Plans noted for discharge home with outpatient PT. 2.  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    Continue current immunosuppressive medications and now back on torsemide with relatively stable allograft function. 3.  Nausea/vomiting:  Resolved following admission.  Will begin probiotic and as needed Imodium for diarrhea. 4.  Anemia of chronic disease: Reports recent (12/2) Aranesp administration.    Iron stores replete. 5.  Hypokalemia:  Potassium levels corrected.  Continues to have intermittent diarrhea.  Subjective:   Reports that he felt well overnight and did not have any diarrhea.   Objective:   BP 137/66 (BP Location: Left Arm)   Pulse (!) 57   Temp 98 F (36.7 C) (Oral)   Resp 18   Ht 6' (1.829 m)   Wt 83 kg   SpO2 99%   BMI 24.82 kg/m   Intake/Output Summary (Last 24 hours) at 04/29/2019 1133 Last data filed at 04/29/2019 0856 Gross per 24 hour  Intake 597 ml  Output 1400 ml  Net -803 ml   Weight change:   Physical Exam: Gen: Sitting comfortably in recliner with LE prosthesis CVS: Pulse regular bradycardia, normal rate, S1 and S2 normal Resp: Diminished breath sounds over bases, no distinct rales or rhonchi Abd: Soft, obese, nontender Ext: Trace upper extremity edema, trace thigh edema.  Imaging: No results found.  Labs: BMET Recent Labs  Lab 04/23/19 1808 04/24/19 0520 04/25/19 0342 04/26/19 0337 04/27/19 0352 04/28/19 0211  04/29/19 0156  NA 137 139 141 142 142 141 140  K 3.8 3.2* 3.1* 3.8 4.0 4.6 4.3  CL 100 106 107 109 108 105 105  CO2 25 25 26 25 26 26 28   GLUCOSE 390* 259* 129* 251* 198* 172* 177*  BUN 49* 48* 49* 47* 45* 47* 47*  CREATININE 4.25* 3.96* 3.94* 3.79* 3.52* 3.48* 3.72*  CALCIUM 8.1* 7.6* 7.7* 7.9* 8.4* 8.4* 8.5*  PHOS  --   --   --   --  1.5* 1.3* 1.5*   CBC Recent Labs  Lab 04/23/19 1808 04/25/19 0342 04/26/19 0337 04/27/19 0352 04/28/19 0211  WBC 21.5* 11.5* 8.8 9.4 11.3*  NEUTROABS 19.7* 8.7*  --   --   --   HGB 8.4* 8.5* 8.3* 8.3* 8.5*  HCT 27.1* 27.5* 27.6* 27.5* 26.7*  MCV 92.8 93.5 95.5 93.5 91.1  PLT 112* 87* 63* 75* 92*   Medications:    . acidophilus  1 capsule Oral Daily  . apixaban  5 mg Oral BID  . aspirin EC  81 mg Oral Daily  . atorvastatin  10 mg Oral Q M,W,F  . B-complex with vitamin C  1 tablet Oral Daily  . calcitRIOL  0.25 mcg Oral Daily  . calcium acetate  1,334 mg Oral TID WC  . cholecalciferol  4,000 Units Oral Daily  . feeding supplement (GLUCERNA SHAKE)  237 mL Oral TID BM  . insulin aspart  0-5 Units Subcutaneous QHS  . insulin aspart  0-9 Units Subcutaneous  TID WC  . insulin aspart  3 Units Subcutaneous TID WC  . insulin glargine  10 Units Subcutaneous Daily  . magnesium chloride  2 tablet Oral BID  . mycophenolate  360 mg Oral BID  . pantoprazole  40 mg Oral Daily  . predniSONE  5 mg Oral Q breakfast  . sodium bicarbonate  1,950 mg Oral QID  . tacrolimus  3 mg Oral Q12H  . tamsulosin  0.4 mg Oral QPC supper  . torsemide  40 mg Oral Daily   Elmarie Shiley, MD 04/29/2019, 11:33 AM

## 2019-05-18 ENCOUNTER — Other Ambulatory Visit: Payer: Self-pay

## 2019-05-18 ENCOUNTER — Ambulatory Visit (HOSPITAL_COMMUNITY)
Admission: RE | Admit: 2019-05-18 | Discharge: 2019-05-18 | Disposition: A | Discharge status: Home / Self Care | Payer: Managed Care, Other (non HMO) | Source: Ambulatory Visit | Attending: Nephrology | Admitting: Nephrology

## 2019-05-18 VITALS — BP 172/85 | HR 73 | Temp 97.3°F | Resp 18

## 2019-05-18 DIAGNOSIS — D638 Anemia in other chronic diseases classified elsewhere: Secondary | ICD-10-CM

## 2019-05-18 MED ORDER — DARBEPOETIN ALFA 300 MCG/0.6ML IJ SOSY: 225.0000 ug | PREFILLED_SYRINGE | INTRAMUSCULAR | Status: DC

## 2019-05-18 MED ORDER — DARBEPOETIN ALFA 25 MCG/0.42ML IJ SOSY
PREFILLED_SYRINGE | INTRAMUSCULAR | Status: AC
  Administered 2019-05-18: 25 ug via SUBCUTANEOUS
  Filled 2019-05-18: qty 0.42

## 2019-05-18 MED ORDER — DARBEPOETIN ALFA 100 MCG/0.5ML IJ SOSY
PREFILLED_SYRINGE | INTRAMUSCULAR | Status: AC
  Administered 2019-05-18: 200 ug via SUBCUTANEOUS
  Filled 2019-05-18: qty 1

## 2019-05-19 LAB — POCT HEMOGLOBIN-HEMACUE: Hemoglobin: 9.7 g/dL — ABNORMAL LOW (ref 13.0–17.0)

## 2019-05-26 DIAGNOSIS — D696 Thrombocytopenia, unspecified: Secondary | ICD-10-CM | POA: Diagnosis not present

## 2019-05-26 DIAGNOSIS — N184 Chronic kidney disease, stage 4 (severe): Secondary | ICD-10-CM | POA: Diagnosis not present

## 2019-05-28 DIAGNOSIS — F330 Major depressive disorder, recurrent, mild: Secondary | ICD-10-CM | POA: Diagnosis not present

## 2019-06-07 ENCOUNTER — Ambulatory Visit: Payer: Managed Care, Other (non HMO) | Admitting: Cardiovascular Disease

## 2019-06-11 DIAGNOSIS — F4325 Adjustment disorder with mixed disturbance of emotions and conduct: Secondary | ICD-10-CM | POA: Diagnosis not present

## 2019-06-11 DIAGNOSIS — Z01818 Encounter for other preprocedural examination: Secondary | ICD-10-CM | POA: Diagnosis not present

## 2019-06-15 ENCOUNTER — Encounter (HOSPITAL_COMMUNITY)
Admission: RE | Admit: 2019-06-15 | Discharge: 2019-06-15 | Disposition: A | Discharge status: Home / Self Care | Payer: Managed Care, Other (non HMO) | Source: Ambulatory Visit | Attending: Nephrology | Admitting: Nephrology

## 2019-06-15 ENCOUNTER — Other Ambulatory Visit: Payer: Self-pay

## 2019-06-15 VITALS — BP 147/65 | HR 58 | Temp 95.9°F | Resp 18

## 2019-06-15 DIAGNOSIS — D638 Anemia in other chronic diseases classified elsewhere: Secondary | ICD-10-CM | POA: Diagnosis present

## 2019-06-15 LAB — IRON AND TIBC
Iron: 85 ug/dL (ref 45–182)
Saturation Ratios: 40 % — ABNORMAL HIGH (ref 17.9–39.5)
TIBC: 210 ug/dL — ABNORMAL LOW (ref 250–450)
UIBC: 125 ug/dL

## 2019-06-15 LAB — POCT HEMOGLOBIN-HEMACUE: Hemoglobin: 11 g/dL — ABNORMAL LOW (ref 13.0–17.0)

## 2019-06-15 LAB — FERRITIN: Ferritin: 394 ng/mL — ABNORMAL HIGH (ref 24–336)

## 2019-06-15 MED ORDER — DARBEPOETIN ALFA 300 MCG/0.6ML IJ SOSY: 225.0000 ug | PREFILLED_SYRINGE | INTRAMUSCULAR | Status: DC

## 2019-06-15 MED ORDER — DARBEPOETIN ALFA 25 MCG/0.42ML IJ SOSY
PREFILLED_SYRINGE | INTRAMUSCULAR | Status: AC
  Filled 2019-06-15: qty 0.42

## 2019-06-15 MED ORDER — DARBEPOETIN ALFA 200 MCG/0.4ML IJ SOSY
PREFILLED_SYRINGE | INTRAMUSCULAR | Status: AC
  Administered 2019-06-15: 225 ug via SUBCUTANEOUS
  Filled 2019-06-15: qty 0.4

## 2019-06-16 MED FILL — Darbepoetin Alfa Soln Prefilled Syringe 25 MCG/0.42ML: INTRAMUSCULAR | Qty: 0.42 | Status: AC

## 2019-06-16 MED FILL — Darbepoetin Alfa Soln Prefilled Syringe 200 MCG/0.4ML: INTRAMUSCULAR | Qty: 0.4 | Status: AC

## 2019-06-22 DIAGNOSIS — R55 Syncope and collapse: Secondary | ICD-10-CM | POA: Diagnosis not present

## 2019-06-22 DIAGNOSIS — I4891 Unspecified atrial fibrillation: Secondary | ICD-10-CM | POA: Diagnosis not present

## 2019-06-22 DIAGNOSIS — D631 Anemia in chronic kidney disease: Secondary | ICD-10-CM | POA: Diagnosis not present

## 2019-06-22 DIAGNOSIS — E785 Hyperlipidemia, unspecified: Secondary | ICD-10-CM | POA: Diagnosis not present

## 2019-06-22 DIAGNOSIS — N189 Chronic kidney disease, unspecified: Secondary | ICD-10-CM | POA: Diagnosis not present

## 2019-06-22 DIAGNOSIS — E1129 Type 2 diabetes mellitus with other diabetic kidney complication: Secondary | ICD-10-CM | POA: Diagnosis not present

## 2019-06-22 DIAGNOSIS — Z940 Kidney transplant status: Secondary | ICD-10-CM | POA: Diagnosis not present

## 2019-06-22 DIAGNOSIS — E8770 Fluid overload, unspecified: Secondary | ICD-10-CM | POA: Diagnosis not present

## 2019-06-22 DIAGNOSIS — I129 Hypertensive chronic kidney disease with stage 1 through stage 4 chronic kidney disease, or unspecified chronic kidney disease: Secondary | ICD-10-CM | POA: Diagnosis not present

## 2019-06-22 DIAGNOSIS — I739 Peripheral vascular disease, unspecified: Secondary | ICD-10-CM | POA: Diagnosis not present

## 2019-06-22 DIAGNOSIS — N2581 Secondary hyperparathyroidism of renal origin: Secondary | ICD-10-CM | POA: Diagnosis not present

## 2019-06-22 DIAGNOSIS — E872 Acidosis: Secondary | ICD-10-CM | POA: Diagnosis not present

## 2019-07-13 ENCOUNTER — Other Ambulatory Visit: Payer: Self-pay | Admitting: Cardiovascular Disease

## 2019-07-13 ENCOUNTER — Ambulatory Visit (HOSPITAL_COMMUNITY)
Admission: RE | Admit: 2019-07-13 | Discharge: 2019-07-13 | Disposition: A | Discharge status: Home / Self Care | Payer: Managed Care, Other (non HMO) | Source: Ambulatory Visit | Attending: Nephrology | Admitting: Nephrology

## 2019-07-13 ENCOUNTER — Other Ambulatory Visit: Payer: Self-pay

## 2019-07-13 VITALS — BP 181/83 | HR 67 | Temp 97.3°F | Resp 18

## 2019-07-13 DIAGNOSIS — D638 Anemia in other chronic diseases classified elsewhere: Secondary | ICD-10-CM | POA: Diagnosis present

## 2019-07-13 LAB — IRON AND TIBC
Iron: 81 ug/dL (ref 45–182)
Saturation Ratios: 43 % — ABNORMAL HIGH (ref 17.9–39.5)
TIBC: 190 ug/dL — ABNORMAL LOW (ref 250–450)
UIBC: 109 ug/dL

## 2019-07-13 LAB — FERRITIN: Ferritin: 273 ng/mL (ref 24–336)

## 2019-07-13 LAB — POCT HEMOGLOBIN-HEMACUE: Hemoglobin: 11.1 g/dL — ABNORMAL LOW (ref 13.0–17.0)

## 2019-07-13 MED ORDER — DARBEPOETIN ALFA 200 MCG/0.4ML IJ SOSY
PREFILLED_SYRINGE | INTRAMUSCULAR | Status: AC
  Filled 2019-07-13: qty 0.4

## 2019-07-13 MED ORDER — DARBEPOETIN ALFA 25 MCG/0.42ML IJ SOSY
PREFILLED_SYRINGE | INTRAMUSCULAR | Status: AC
  Filled 2019-07-13: qty 0.42

## 2019-07-13 MED ORDER — DARBEPOETIN ALFA 300 MCG/0.6ML IJ SOSY
225.0000 ug | PREFILLED_SYRINGE | INTRAMUSCULAR | Status: DC
  Administered 2019-07-13: 225 ug via SUBCUTANEOUS

## 2019-07-14 MED FILL — Darbepoetin Alfa Soln Prefilled Syringe 200 MCG/0.4ML: INTRAMUSCULAR | Qty: 0.4 | Status: AC

## 2019-07-14 MED FILL — Darbepoetin Alfa Soln Prefilled Syringe 25 MCG/0.42ML: INTRAMUSCULAR | Qty: 0.42 | Status: AC

## 2019-08-05 ENCOUNTER — Encounter: Payer: Self-pay | Admitting: General Practice

## 2019-08-10 ENCOUNTER — Other Ambulatory Visit: Payer: Self-pay

## 2019-08-10 ENCOUNTER — Ambulatory Visit (HOSPITAL_COMMUNITY)
Admission: RE | Admit: 2019-08-10 | Discharge: 2019-08-10 | Disposition: A | Discharge status: Home / Self Care | Payer: Managed Care, Other (non HMO) | Source: Ambulatory Visit | Attending: Nephrology | Admitting: Nephrology

## 2019-08-10 VITALS — BP 179/78 | HR 61 | Temp 97.1°F | Resp 18

## 2019-08-10 DIAGNOSIS — D638 Anemia in other chronic diseases classified elsewhere: Secondary | ICD-10-CM | POA: Insufficient documentation

## 2019-08-10 LAB — IRON AND TIBC
Iron: 110 ug/dL (ref 45–182)
Saturation Ratios: 63 % — ABNORMAL HIGH (ref 17.9–39.5)
TIBC: 175 ug/dL — ABNORMAL LOW (ref 250–450)
UIBC: 65 ug/dL

## 2019-08-10 LAB — FERRITIN: Ferritin: 272 ng/mL (ref 24–336)

## 2019-08-10 LAB — POCT HEMOGLOBIN-HEMACUE: Hemoglobin: 11.5 g/dL — ABNORMAL LOW (ref 13.0–17.0)

## 2019-08-10 MED ORDER — DARBEPOETIN ALFA 300 MCG/0.6ML IJ SOSY: 225.0000 ug | PREFILLED_SYRINGE | INTRAMUSCULAR | Status: DC

## 2019-08-10 MED ORDER — DARBEPOETIN ALFA 200 MCG/0.4ML IJ SOSY
PREFILLED_SYRINGE | INTRAMUSCULAR | Status: AC
  Administered 2019-08-10: 200 ug via SUBCUTANEOUS
  Filled 2019-08-10: qty 0.4

## 2019-08-10 MED ORDER — DARBEPOETIN ALFA 25 MCG/0.42ML IJ SOSY
PREFILLED_SYRINGE | INTRAMUSCULAR | Status: AC
  Administered 2019-08-10: 25 ug via SUBCUTANEOUS
  Filled 2019-08-10: qty 0.42

## 2019-09-07 ENCOUNTER — Other Ambulatory Visit: Payer: Self-pay

## 2019-09-07 ENCOUNTER — Ambulatory Visit (HOSPITAL_COMMUNITY)
Admission: RE | Admit: 2019-09-07 | Discharge: 2019-09-07 | Disposition: A | Discharge status: Home / Self Care | Payer: Managed Care, Other (non HMO) | Source: Ambulatory Visit | Attending: Nephrology | Admitting: Nephrology

## 2019-09-07 VITALS — BP 179/70 | HR 59 | Temp 97.2°F | Resp 18

## 2019-09-07 DIAGNOSIS — D638 Anemia in other chronic diseases classified elsewhere: Secondary | ICD-10-CM | POA: Insufficient documentation

## 2019-09-07 LAB — IRON AND TIBC
Iron: 91 ug/dL (ref 45–182)
Saturation Ratios: 54 % — ABNORMAL HIGH (ref 17.9–39.5)
TIBC: 168 ug/dL — ABNORMAL LOW (ref 250–450)
UIBC: 77 ug/dL

## 2019-09-07 LAB — POCT HEMOGLOBIN-HEMACUE: Hemoglobin: 10.7 g/dL — ABNORMAL LOW (ref 13.0–17.0)

## 2019-09-07 LAB — FERRITIN: Ferritin: 531 ng/mL — ABNORMAL HIGH (ref 24–336)

## 2019-09-07 MED ORDER — DARBEPOETIN ALFA 300 MCG/0.6ML IJ SOSY: 225.0000 ug | PREFILLED_SYRINGE | INTRAMUSCULAR | Status: DC

## 2019-09-07 MED ORDER — DARBEPOETIN ALFA 200 MCG/0.4ML IJ SOSY
PREFILLED_SYRINGE | INTRAMUSCULAR | Status: AC
  Administered 2019-09-07: 200 ug via SUBCUTANEOUS
  Filled 2019-09-07: qty 0.4

## 2019-09-07 MED ORDER — DARBEPOETIN ALFA 25 MCG/0.42ML IJ SOSY
PREFILLED_SYRINGE | INTRAMUSCULAR | Status: AC
  Administered 2019-09-07: 25 ug via SUBCUTANEOUS
  Filled 2019-09-07: qty 0.42

## 2019-10-05 ENCOUNTER — Other Ambulatory Visit: Payer: Self-pay

## 2019-10-05 ENCOUNTER — Ambulatory Visit (HOSPITAL_COMMUNITY)
Admission: RE | Admit: 2019-10-05 | Discharge: 2019-10-05 | Disposition: A | Discharge status: Home / Self Care | Payer: Managed Care, Other (non HMO) | Source: Ambulatory Visit | Attending: Nephrology | Admitting: Nephrology

## 2019-10-05 VITALS — BP 180/78 | Temp 97.3°F | Resp 18

## 2019-10-05 DIAGNOSIS — D638 Anemia in other chronic diseases classified elsewhere: Secondary | ICD-10-CM | POA: Insufficient documentation

## 2019-10-05 LAB — FERRITIN: Ferritin: 233 ng/mL (ref 24–336)

## 2019-10-05 LAB — IRON AND TIBC
Iron: 48 ug/dL (ref 45–182)
Saturation Ratios: 29 % (ref 17.9–39.5)
TIBC: 168 ug/dL — ABNORMAL LOW (ref 250–450)
UIBC: 120 ug/dL

## 2019-10-05 LAB — POCT HEMOGLOBIN-HEMACUE: Hemoglobin: 11.1 g/dL — ABNORMAL LOW (ref 13.0–17.0)

## 2019-10-05 MED ORDER — DARBEPOETIN ALFA 25 MCG/0.42ML IJ SOSY
PREFILLED_SYRINGE | INTRAMUSCULAR | Status: AC
  Administered 2019-10-05: 25 ug
  Filled 2019-10-05: qty 0.42

## 2019-10-05 MED ORDER — DARBEPOETIN ALFA 300 MCG/0.6ML IJ SOSY: 225.0000 ug | PREFILLED_SYRINGE | INTRAMUSCULAR | Status: DC

## 2019-10-05 MED ORDER — DARBEPOETIN ALFA 200 MCG/0.4ML IJ SOSY
PREFILLED_SYRINGE | INTRAMUSCULAR | Status: AC
  Administered 2019-10-05: 200 ug
  Filled 2019-10-05: qty 0.4

## 2019-10-06 ENCOUNTER — Encounter: Payer: Self-pay | Admitting: Physical Therapy

## 2019-10-06 ENCOUNTER — Ambulatory Visit: Payer: Managed Care, Other (non HMO) | Attending: Family Medicine | Admitting: Physical Therapy

## 2019-10-06 ENCOUNTER — Other Ambulatory Visit: Payer: Self-pay

## 2019-10-06 DIAGNOSIS — R2689 Other abnormalities of gait and mobility: Secondary | ICD-10-CM | POA: Insufficient documentation

## 2019-10-06 DIAGNOSIS — M25651 Stiffness of right hip, not elsewhere classified: Secondary | ICD-10-CM | POA: Insufficient documentation

## 2019-10-06 DIAGNOSIS — Z9181 History of falling: Secondary | ICD-10-CM | POA: Insufficient documentation

## 2019-10-06 DIAGNOSIS — R293 Abnormal posture: Secondary | ICD-10-CM | POA: Insufficient documentation

## 2019-10-06 DIAGNOSIS — R42 Dizziness and giddiness: Secondary | ICD-10-CM | POA: Diagnosis present

## 2019-10-06 DIAGNOSIS — R531 Weakness: Secondary | ICD-10-CM | POA: Insufficient documentation

## 2019-10-06 DIAGNOSIS — M25652 Stiffness of left hip, not elsewhere classified: Secondary | ICD-10-CM

## 2019-10-06 DIAGNOSIS — R2681 Unsteadiness on feet: Secondary | ICD-10-CM | POA: Diagnosis present

## 2019-10-06 NOTE — Therapy (Signed)
Paul [name] [address] [place], [place], [address] Phone: [phone]   Fax:  [phone]  Physical Therapy Evaluation  Patient Details  Name: Paul [name] MRN: [id number] Date of Birth: Sep 28, 1955 Referring Provider (PT): [name], DO   Encounter Date: 10/06/2019  PT End of Session - 10/06/19 1423    Visit Number  1    Number of Visits  25    Date for PT Re-Evaluation  01/04/20    Authorization Type  [employer] Managed Medicare A & B    PT Start Time  1145    PT Stop Time  1230    PT Time Calculation (min)  45 min    Equipment Utilized During Treatment  Gait belt    Activity Tolerance  Patient tolerated treatment well;Other (comment)   limited by light-headedness   Behavior During Therapy  [facility] for tasks assessed/performed       Past Medical History:  Diagnosis Date  . A-fib (Paul [name])   . Anemia    esrd  . Calciphylaxis 05/2013   lower ext  . Cancer ([place])    hx duodenal adenoCa 2002, resected  . Closed left arm fracture 1967  . Corneal abrasion, left   . Depression   . Diabetes mellitus    Type 1 iddm x 11 yrs  . Diabetic neuropathy (Paul [name])   . ESRD on hemodialysis ([name])    Pt has ESRD due to DM.  He had a L upper arm AVF prior to starting HD which was ligated due to L arm steal syndrome.  He started HD in Jan 2014 and did home HD.  Paul family couldn't cannulate Paul R arm AVF successfully so by mid 2014 they decided to switch to PD which was done late summer 2014.  In Jan 2015 he was admitted with FTT felt to be due to underdialysis on PD and PD was abandoned and he   . GERD (gastroesophageal reflux disease)   . Glomerulosclerosis, diabetic (Paul [name])   . Heart murmur   . History of blood transfusion   . Hypertension    off of meds due to orthostatic hypotension  . Kidney transplant recipient   . Open wound of both legs with complication    Pt has had progressive wounds of both LE's including gangrene of Paul toes  and patchy necrosis of Paul calves.  He has been treated at [name] by Paul [name] with hyperbaric O2 5d per week.  He is getting Na Thiosulfate with HD for suspected calciphylaxis. Pt says doctor's aren't sure if these ulcers were diabetic ulcers or calciphylaxis.  He underwent bilat BKA on 07/30/13.  He says that Paul woun    Past Surgical History:  Procedure Laterality Date  . AMPUTATION Bilateral 07/30/2013   Procedure: AMPUTATION BELOW KNEE;  Surgeon: Newt Minion, MD;  Location: [place];  Service: Orthopedics;  Laterality: Bilateral;  Bilateral Below Knee Amputations  . AMPUTATION Bilateral 09/03/2013   Procedure: AMPUTATION BELOW KNEE;  Surgeon: Newt Minion, MD;  Location: [place];  Service: Orthopedics;  Laterality: Bilateral;  Bilateral Below Knee Amputation Revision  . AV FISTULA PLACEMENT  06/07/2011   Procedure: ARTERIOVENOUS (AV) FISTULA CREATION;  Surgeon: Angelia Mould, MD;  Location: [place];  Service: Vascular;  Laterality: Left;  . AV FISTULA PLACEMENT  06/18/2012   Procedure: ARTERIOVENOUS (AV) FISTULA CREATION;  Surgeon: Mal Misty, MD;  Location: [place];  Service: Vascular;  Laterality: Right;  [place]  .  BILIARY DIVERSION, EXTERNAL  2002  . BONE MARROW BIOPSY  2012  . CAPD INSERTION N/A 01/14/2013   Procedure: LAPAROSCOPIC INSERTION CONTINUOUS AMBULATORY PERITONEAL DIALYSIS  (CAPD) CATHETER;  Surgeon: Adin Hector, MD;  Location: [place];  Service: General;  Laterality: N/A;  . CAPD REMOVAL N/A 07/09/2013   Procedure: CONTINUOUS AMBULATORY PERITONEAL DIALYSIS  (CAPD) CATHETER REMOVAL;  Surgeon: Adin Hector, MD;  Location: [place];  Service: General;  Laterality: N/A;  . COLONOSCOPY     Hx: of  . INSERTION OF DIALYSIS CATHETER  05/22/2012   Procedure: INSERTION OF DIALYSIS CATHETER;  Surgeon: Mal Misty, MD;  Location: [place];  Service: Vascular;  Laterality: N/A;  right internal jugular vein  . IR ANGIO INTRA EXTRACRAN SEL COM CAROTID INNOMINATE BILAT MOD SED   10/28/2018  . IR ANGIO VERTEBRAL SEL SUBCLAVIAN INNOMINATE UNI R MOD SED  10/28/2018  . IR ANGIO VERTEBRAL SEL VERTEBRAL UNI L MOD SED  10/28/2018  . LIGATION OF ARTERIOVENOUS  FISTULA  05/22/2012   Procedure: LIGATION OF ARTERIOVENOUS  FISTULA;  Surgeon: Mal Misty, MD;  Location: [place];  Service: Vascular;  Laterality: Left;  Left brachio-cephalic fistula  . LOWER EXTREMITY ANGIOGRAM N/A 06/14/2013   Procedure: LOWER EXTREMITY ANGIOGRAM;  Surgeon: Angelia Mould, MD;  Location: [facility] C[facility] LAB;  Service: Cardiovascular;  Laterality: N/A;  . NEPHRECTOMY TRANSPLANTED ORGAN    . OTHER SURGICAL HISTORY     Cyst removed from back  . PORTACATH PLACEMENT  2002  . RADIOLOGY WITH ANESTHESIA N/A 10/28/2018   Procedure: IR WITH ANESTHESIA;  Surgeon: Radiologist, Medication, MD;  Location: [place];  Service: Radiology;  Laterality: N/A;  . REMOVAL OF A DIALYSIS CATHETER    . RENAL BIOPSY, PERCUTANEOUS  2012  . TONSILLECTOMY    . VASCULAR SURGERY    . WHIPPLE PROCEDURE  2002   duodenal ca, Paul. [place] [name]    There were no vitals filed for this visit.   Subjective Assessment - 10/06/19 1151    Subjective  This 64yo male was referred to PT on 04/27/2019 by [name], DO with weakness & gait disorder.  He had PT following bil. TTAs & TIA and successfully became ambulatory at community level. His kidney transplant is failing and is back on transplant list. He was hospitalized 4 times in 2020 with medical issues. His current prostheses delivered 03/09/2019. He has become deconditioned causing balance & gait difficulty.    Pertinent History  HTN, DM2, renal transplant, CKD st IV, calciphylaxis, A-Fib, bil. TTAs, TIA    Patient Stated Goals  He would like to get off walker completely. Get close to prior level with prostheses which means no device in home & cane in community.    Currently in [name]?  No/denies         [facility] PT Assessment - 10/06/19 1145      Assessment   Medical Diagnosis  deconditioning  s/p bil. Transtibial Amputations    Referring Provider (PT)  [name], DO    Onset Date/Surgical Date  04/27/19   MD referral to PT   Hand Dominance  Right      Precautions   Precautions  Fall   NO BP RUE     Balance Screen   Has Paul patient fallen in Paul past 6 months  Yes    How many times?  1   On 5/15 arising low couch & left prosthesis off, no injuries   Has Paul patient had  a decrease in activity level because of a fear of falling?   Yes    Is Paul patient reluctant to leave their home because of a fear of falling?   No      Home Film/video editor residence    Living Arrangements  Spouse/significant other;Other relatives   elderly mother-law   Type of [place] entrance;Stairs to enter    Entrance Stairs-Number of Steps  4    Entrance Stairs-Rails  Right;Left;Cannot reach both    Home Layout  Two level;Able to live on main level with bedroom/bathroom    Alternate Level Stairs-Number of Steps  14-16    Alternate Level Stairs-Rails  Right;Left    Home Equipment  Walker - 2 wheels;Cane - single point;Crutches;Tub bench;Grab bars - toilet;Wheelchair - manual      Prior Function   Level of Independence  Independent;Independent with household mobility without device;Independent with community mobility with device   with prostheses     Posture/Postural Control   Posture/Postural Control  Postural limitations    Postural Limitations  Rounded Shoulders;Forward head;Flexed trunk      ROM / Strength   AROM / PROM / Strength  AROM      AROM   Overall AROM   Deficits;Within functional limits for tasks performed    Overall AROM Comments  standing hip extension -20* bilaterally      PROM   Overall PROM   --      Strength   Overall Strength  Deficits    Right Hip Flexion  3-/5    Right Hip Extension  3-/5    Right Hip ABduction  3-/5    Left Hip Flexion  3-/5    Left Hip Extension  3-/5    Left Hip ABduction  3-/5     Right Knee Flexion  3-/5    Right Knee Extension  3+/5    Left Knee Flexion  3-/5    Left Knee Extension  3+/5      Transfers   Transfers  Sit to Stand;Stand to Sit    Sit to Stand  5: Supervision;With upper extremity assist;With armrests;From chair/3-in-1    Stand to Sit  5: Supervision;With upper extremity assist;With armrests;To chair/3-in-1      Ambulation/Gait   Ambulation/Gait  Yes    Ambulation/Gait Assistance  4: Min assist;5: Supervision;4: Min guard   supervision RW & minA/min guard cane stand alone tip   Ambulation/Gait Assistance Details  light headed after 200' gait with cane SpO2 99% HR 85 no dyspnea noted    Ambulation Distance (Feet)  200 Feet    Assistive device  Prostheses;Straight cane;Rolling walker   cane with stand alone tip   Gait Pattern  Step-through pattern;Decreased arm swing - left;Decreased stride length;Left flexed knee in stance;Antalgic;Lateral hip instability;Trunk flexed;Wide base of support    Ambulation Surface  Level;Indoor    Gait velocity  2.58 ft/sec RW & 2.60 ft/sec cane    Stairs  Yes    Stairs Assistance  5: Supervision    Stair Management Technique  One rail Right;Step to pattern;Sideways   BUEs on rail ascending & LUE rail/RUE cane descending   Number of Stairs  4    Height of Stairs  6      Standardized Balance Assessment   Standardized Balance Assessment  Berg Balance Test      Berg Balance Test   Sit to Stand  Able to stand  independently using hands    Standing Unsupported  Able to stand safely 2 minutes    Sitting with Back Unsupported but Feet Supported on Floor or Stool  Able to sit safely and securely 2 minutes    Stand to Sit  Controls descent by using hands    Transfers  Able to transfer safely, definite need of hands    Standing Unsupported with Eyes Closed  Able to stand 3 seconds    Standing Unsupported with Feet Together  Needs help to attain position but able to stand for 30 seconds with feet together    From Standing,  Reach Forward with Outstretched Arm  Can reach forward >5 cm safely (2")    From Standing Position, Pick up Object from Floor  Able to pick up shoe, needs supervision    From Standing Position, Turn to Look Behind Over each Shoulder  Needs supervision when turning    Turn 360 Degrees  Needs assistance while turning    Standing Unsupported, Alternately Place Feet on Step/Stool  Needs assistance to keep from falling or unable to try    Standing Unsupported, One Foot in Front  Loses balance while stepping or standing    Standing on One Leg  Unable to try or needs assist to prevent fall    Total Score  26    Berg comment:  <356/56 indicates close to 100% risk of falls      Dynamic Gait Index   Level Surface  Moderate Impairment    Change in Gait Speed  Moderate Impairment    Gait with Horizontal Head Turns  Moderate Impairment    Gait with Vertical Head Turns  Moderate Impairment    Gait and Pivot Turn  Moderate Impairment    Step Over Obstacle  Severe Impairment    Step Around Obstacles  Moderate Impairment    Steps  Moderate Impairment    Total Score  7    DGI comment:  <19/24 indicates fall risk      Prosthetics Assessment - 10/06/19 1145      Prosthetics   Prosthetic Care Independent with  Skin check;Residual limb care;Prosthetic cleaning;Proper wear schedule/adjustment;Proper weight-bearing schedule/adjustment    Prosthetic Care Dependent with  Correct ply sock adjustment    Prosthetic Care Comments   Make appointment with prosthetist to check for needs of pads Left limb due to callousing indicating high distal limb weight bearing.     Donning prosthesis   Modified independent (Device/Increase time)    Doffing prosthesis   Modified independent (Device/Increase time)    Current prosthetic wear tolerance (days/week)   daily    Current prosthetic wear tolerance (#hours/day)   most of awake hours    Current prosthetic weight-bearing tolerance (hours/day)   No limb [name] with standing &  gait activities    Edema  none noted either residual limb    Residual limb condition   left limb has callousing at distal tibia & distal fibula    Prosthesis Description  suction sleeve suspension, silicon gel interface, Rush dynamic response feet    K code/activity level with prosthetic use   K3 full community with variable cadence                 Objective measurements completed on examination: See above findings.              PT Education - 10/06/19 1421    Education Details  check into [place] coverage of  fitness center - [employer] vs [employer]    Person(s) Educated  Patient    Methods  Explanation;Verbal cues    Comprehension  Verbalized understanding       PT Short Term Goals - 10/06/19 1438      PT SHORT TERM GOAL #1   Title  Patient demonstrates & verbalizes understanding of initial HEP. (All STGs Target Date 11/19/2019)    Time  4   Pt [place] his [place] twice so STGs set for 4 weeks of care   Status  New    Target Date  11/19/19      PT SHORT TERM GOAL #2   Title  Berg Balance >/= 36/56    Time  4    Period  Weeks    Status  New    Target Date  11/19/19      PT SHORT TERM GOAL #3   Title  Dynamic Gait Index with cane >/= 12/24 with supervision    Time  4    Period  Weeks    Status  New    Target Date  11/19/19      PT SHORT TERM GOAL #4   Title  Patient ambulates 200' with cane & prostheses with supervision.    Time  4    Period  Weeks    Status  New    Target Date  11/19/19      PT SHORT TERM GOAL #5   Title  Patient negotiates ramps, curbs & stairs single rail with cane & prostheses with minA.    Time  4    Period  Weeks    Status  New    Target Date  11/19/19        PT Long Term Goals - 10/06/19 1438      PT LONG TERM GOAL #1   Title  Patient verbalizes & demonstrates understanding of ongoing fitness plan including home exercises & fitness center exercise program.  (All LTGs Target Date: 12/17/2019)    Time  8    Period   Weeks    Status  New    Target Date  12/17/19      PT LONG TERM GOAL #2   Title  Berg >/= 45/56    Time  8    Period  Weeks    Status  New    Target Date  12/17/19      PT LONG TERM GOAL #3   Title  Dynamic Gait Index with cane >/= 19/24 to indicate lower fall risk.    Time  8    Period  Weeks    Status  New    Target Date  12/17/19      PT LONG TERM GOAL #4   Title  Patient ambulates >400' with cane & prostheses modified independent.    Time  8    Period  Weeks    Status  New    Target Date  12/17/19      PT LONG TERM GOAL #5   Title  Patient negotiates ramps, curbs & stairs with single rail with cane & prostheses modified independent.    Time  8    Period  Weeks    Status  New    Target Date  12/17/19      PT LONG TERM GOAL #6   Title  Patient ambulates 69' with prostheses only around furniture modified independent.    Time  8  Period  Weeks    Status  New    Target Date  12/17/19             Plan - 10/06/19 1429    Clinical Impression Statement  This 64yo male was referred to PT with deconditioning due to multiple medical issues with 4 hospitalizations in 2020.  He has history of bilateral Transtibial Amputations and was able to function at community level with cane & household without device except prostheses.  He has tightness in bilateral hip flexors and has impaired posture with negative impact on balance. He reports recent fall without injury. He has weakness especially in hips & knees bilaterally. Berg Balance 26/56 indicates high fall risk & dependency in standing ADLs.  Dynamic Gait Index 7/24 with cane with minA indicates high fall risk also.  Patient became light headed with 200' gait with normal vital signs. With 3 minute seated rest, he was able to resume PT evaluation.  Patient would benefit from skilled care to progress mobility & safety.    Personal Factors and Comorbidities  Comorbidity 3+    Comorbidities  HTN, DM2, renal transplant, CKD st IV,  calciphylaxis, A-Fib, bil. TTAs, TIA    Examination-Activity Limitations  Locomotion Level;Squat;Stairs;Stand;Transfers    Examination-Participation Restrictions  Community Activity    Stability/Clinical Decision Making  Evolving/Moderate complexity    Clinical Decision Making  Moderate    Rehab Potential  Good    PT Frequency  2x / week    PT Duration  8 weeks    PT Treatment/Interventions  ADLs/Self Care Home Management;DME Instruction;Gait training;Stair training;Functional mobility training;Therapeutic activities;Therapeutic exercise;Balance training;Neuromuscular re-education;Patient/family education;Prosthetic Training;Manual techniques;Vestibular    PT Next Visit Plan  HEP for balance and strengthening hips & knees, ask if checked on [employer] coverage for fitness center    Recommended Other [place] to augment PT & transition for ongoing exercising once PT discharges.    Consulted and Agree with Plan of Care  Patient       Patient will benefit from skilled therapeutic intervention in order to improve Paul following deficits and impairments:  Abnormal gait, Cardiopulmonary status limiting activity, Decreased activity tolerance, Decreased balance, Decreased endurance, Decreased mobility, Decreased range of motion, Decreased strength, Impaired flexibility, Postural dysfunction, Prosthetic Dependency  Visit Diagnosis: Other abnormalities of gait and mobility  Unsteadiness on feet  Abnormal posture  Weakness generalized  History of fall  Stiffness of right hip, not elsewhere classified  Stiffness of left hip, not elsewhere classified  Dizziness and giddiness     Problem List Patient Active Problem List   Diagnosis Date Noted  . CKD stage 4 due to type 2 diabetes mellitus ([place]) 04/29/2019  . AKI (acute kidney injury) ([place]) 04/24/2019  . Hyperglycemia 04/24/2019  . Nausea and vomiting 04/24/2019  . Fever 04/23/2019  . Acute metabolic encephalopathy 48/18/5631   . Stroke ([place]) 10/28/2018  . Middle cerebral artery embolism, left 10/28/2018  . Metabolic acidosis 49/70/2637  . Dehydration 05/24/2018  . Pressure injury of skin 05/24/2018  . Dyspnea 05/23/2018  . Acute respiratory failure with hypoxia ([place]) 05/23/2018  . Abnormal cardiovascular stress test 12/29/2013  . Awaiting organ transplant 12/09/2013  . Leg ulcer ([place]) 11/17/2013  . Chronic kidney disease (CKD), stage V ([place]) 11/17/2013  . Diabetes ([place]) 11/17/2013  . Cancer of duodenum ([place]) 11/17/2013  . Dyslipidemia 11/17/2013  . Chronic kidney disease requiring chronic dialysis ([place]) 11/17/2013  . Hypercholesteremia 11/17/2013  . H/O malignant neoplasm 11/17/2013  . H/O:  HTN (hypertension) 11/17/2013  . BP (high blood pressure) 11/17/2013  . Hypotension 11/17/2013  . Angiopathy, peripheral ([place]) 11/17/2013  . AF (paroxysmal atrial fibrillation) ([place]) 11/17/2013  . BKA stump complication ([place]) 97/74/1423  . S/P bilateral BKA (below knee amputation) ([place]) 08/03/2013  . S/P BKA (below knee amputation) bilateral ([place]) 07/30/2013  . Open wound of both legs with complication 95/32/0233  . Acute blood loss anemia 07/12/2013  . Syncope 07/09/2013  . Intolerance to CAPD peritoneal dialysis s/p CAPD cath removal 07/09/2013 07/05/2013  . Critical lower limb ischemia 06/30/2013  . Extremity [name] 06/30/2013  . Atherosclerosis of native arteries of Paul extremities with ulceration(440.23) 06/17/2013  . Gait disorder 05/31/2013  . Weakness 05/24/2013  . Depression 04/03/2013  . GERD (gastroesophageal reflux disease) 04/03/2013  . Atrial fibrillation ([place]) 04/02/2013  . DM (diabetes mellitus) ([place]) 12/28/2012  . Other complications due to renal dialysis device, implant, and graft 05/19/2012  . Diabetes mellitus, type 2 ([place]) 11/13/2011  . Anemia 07/02/2011  . ESRD on hemodialysis ([place]) 05/29/2011    Jamey Reas PT, DPT 10/06/2019, 10:32 PM  [facility] [address] [place], [place], [address] Phone: [phone]   Fax:  [phone]  Name: SHAHEIM MAHAR MRN: [id number] Date of Birth: 06/06/1955

## 2019-10-11 ENCOUNTER — Emergency Department (HOSPITAL_COMMUNITY)
Admission: EM | Admit: 2019-10-11 | Discharge: 2019-10-12 | Disposition: A | Discharge status: Home / Self Care | Payer: Managed Care, Other (non HMO) | Attending: Emergency Medicine | Admitting: Emergency Medicine

## 2019-10-11 ENCOUNTER — Encounter (HOSPITAL_COMMUNITY): Payer: Self-pay

## 2019-10-11 ENCOUNTER — Ambulatory Visit: Payer: Managed Care, Other (non HMO) | Admitting: Rehabilitation

## 2019-10-11 ENCOUNTER — Other Ambulatory Visit: Payer: Self-pay

## 2019-10-11 DIAGNOSIS — Z7901 Long term (current) use of anticoagulants: Secondary | ICD-10-CM | POA: Diagnosis not present

## 2019-10-11 DIAGNOSIS — E119 Type 2 diabetes mellitus without complications: Secondary | ICD-10-CM | POA: Diagnosis not present

## 2019-10-11 DIAGNOSIS — Z794 Long term (current) use of insulin: Secondary | ICD-10-CM | POA: Insufficient documentation

## 2019-10-11 DIAGNOSIS — R509 Fever, unspecified: Secondary | ICD-10-CM | POA: Diagnosis not present

## 2019-10-11 DIAGNOSIS — I4891 Unspecified atrial fibrillation: Secondary | ICD-10-CM | POA: Insufficient documentation

## 2019-10-11 DIAGNOSIS — Z79899 Other long term (current) drug therapy: Secondary | ICD-10-CM | POA: Diagnosis not present

## 2019-10-11 DIAGNOSIS — Z20822 Contact with and (suspected) exposure to covid-19: Secondary | ICD-10-CM | POA: Insufficient documentation

## 2019-10-11 DIAGNOSIS — Z940 Kidney transplant status: Secondary | ICD-10-CM | POA: Insufficient documentation

## 2019-10-11 DIAGNOSIS — R5383 Other fatigue: Secondary | ICD-10-CM | POA: Diagnosis present

## 2019-10-11 DIAGNOSIS — I10 Essential (primary) hypertension: Secondary | ICD-10-CM | POA: Diagnosis not present

## 2019-10-11 DIAGNOSIS — Z85038 Personal history of other malignant neoplasm of large intestine: Secondary | ICD-10-CM | POA: Insufficient documentation

## 2019-10-11 DIAGNOSIS — R531 Weakness: Secondary | ICD-10-CM | POA: Insufficient documentation

## 2019-10-11 NOTE — ED Provider Notes (Signed)
[facility] EMERGENCY DEPARTMENT Provider Note  CSN: [id number] Arrival date & time: 10/11/19 2313  Chief Complaint(s) Weakness  HPI Paul [name] is a 64 y.o. 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 fib on Eliquis, ESRD who used to be on dialysis but is now status post renal transplant on CellCept and Prograf,  who presents to the emergency department several days of generalized fatigue.  Patient presented today because he was unable to get himself up off the toilet due to his generalized fatigue.  Patient endorses chronic diarrhea.  Denies any associated fevers, chills, nausea, vomiting, headache, chest pain, or shortness of breath.  No abdominal pain.  No urinary symptoms.  Patient reports that his wife has had a cough for the past week.  No other known sick contacts.    HPI  Past Medical History Past Medical History:  Diagnosis Date  . A-fib ([place])   . Anemia    esrd  . Calciphylaxis 05/2013   lower ext  . Cancer ([name])    hx duodenal adenoCa 2002, resected  . Closed left arm fracture 1967  . Corneal abrasion, left   . Depression   . Diabetes mellitus    Type 1 iddm x 11 yrs  . Diabetic neuropathy ([place])   . ESRD on hemodialysis ([place])    Pt has ESRD due to DM.  He had a L upper arm AVF prior to starting HD which was ligated due to L arm steal syndrome.  He started HD in Jan 2014 and did home HD.  The family couldn't cannulate the R arm AVF successfully so by mid 2014 they decided to switch to PD which was done late summer 2014.  In Jan 2015 he was admitted with FTT felt to be due to underdialysis on PD and PD was abandoned and he   . GERD (gastroesophageal reflux disease)   . Glomerulosclerosis, diabetic ([place])   . Heart murmur   . History of blood transfusion   . Hypertension    off  of meds due to orthostatic hypotension  . Kidney transplant recipient   . Open wound of both legs with complication    Pt has had progressive wounds of both LE's including gangrene of the toes and patchy necrosis of the calves.  He has been treated at [facility] by Dr Jerline Pain with hyperbaric O2 5d per week.  He is getting Na Thiosulfate with HD for suspected calciphylaxis. Pt says doctor's aren't sure if these ulcers were diabetic ulcers or calciphylaxis.  He underwent bilat BKA on 07/30/13.  He says that the woun   Patient Active Problem List   Diagnosis Date Noted  . CKD stage 4 due to type 2 diabetes mellitus ([place]) 04/29/2019  . AKI (acute kidney injury) ([place]) 04/24/2019  . Hyperglycemia 04/24/2019  . Nausea and vomiting 04/24/2019  . Fever 04/23/2019  . Acute metabolic encephalopathy 58/85/0277  . Stroke ([place]) 10/28/2018  . Middle cerebral artery embolism, left 10/28/2018  . Metabolic acidosis 41/28/7867  . Dehydration 05/24/2018  . Pressure injury of skin 05/24/2018  . Dyspnea 05/23/2018  . Acute respiratory failure with hypoxia ([place]) 05/23/2018  . Abnormal cardiovascular stress test 12/29/2013  . Awaiting organ transplant 12/09/2013  . Leg ulcer ([place]) 11/17/2013  . Chronic kidney disease (CKD), stage V ([place]) 11/17/2013  . Diabetes ([place]) 11/17/2013  . Cancer  of duodenum ([place]) 11/17/2013  . Dyslipidemia 11/17/2013  . Chronic kidney disease requiring chronic dialysis ([place]) 11/17/2013  . Hypercholesteremia 11/17/2013  . H/O malignant neoplasm 11/17/2013  . H/O: HTN (hypertension) 11/17/2013  . BP (high blood pressure) 11/17/2013  . Hypotension 11/17/2013  . Angiopathy, peripheral ([place]) 11/17/2013  . AF (paroxysmal atrial fibrillation) ([place]) 11/17/2013  . BKA stump complication ([place]) 78/58/8502  . S/P bilateral BKA (below knee amputation) ([place]) 08/03/2013  . S/P BKA (below knee amputation) bilateral ([place]) 07/30/2013  . Open wound of both legs with complication 77/41/2878  .  Acute blood loss anemia 07/12/2013  . Syncope 07/09/2013  . Intolerance to CAPD peritoneal dialysis s/p CAPD cath removal 07/09/2013 07/05/2013  . Critical lower limb ischemia 06/30/2013  . Extremity pain 06/30/2013  . Atherosclerosis of native arteries of the extremities with ulceration(440.23) 06/17/2013  . Gait disorder 05/31/2013  . Weakness 05/24/2013  . Depression 04/03/2013  . GERD (gastroesophageal reflux disease) 04/03/2013  . Atrial fibrillation ([place]) 04/02/2013  . DM (diabetes mellitus) ([place]) 12/28/2012  . Other complications due to renal dialysis device, implant, and graft 05/19/2012  . Diabetes mellitus, type 2 ([place]) 11/13/2011  . Anemia 07/02/2011  . ESRD on hemodialysis ([place]) 05/29/2011   Home Medication(s) Prior to Admission medications   Medication Sig Start Date End Date Taking? Authorizing Provider  amLODipine (NORVASC) 10 MG tablet Take 10 mg by mouth daily.   Yes [provider]  aspirin EC 81 MG tablet Take 81 mg by mouth daily.   Yes [provider]  atorvastatin (LIPITOR) 10 MG tablet Take 1 tablet (10 mg total) by mouth daily. Patient taking differently: Take 10 mg by mouth every Monday, Wednesday, and Friday.  08/13/13  Yes Angiulli, Lavon Paganini, PA-C  calcitRIOL (ROCALTROL) 0.25 MCG capsule Take 0.25 mcg by mouth daily.    Yes [provider]  calcium acetate (PHOSLO) 667 MG capsule Take 1,334 mg by mouth 3 (three) times daily with meals.   Yes [provider]  [employer]S D3 50 MCG (2000 UT) CAPS Take 4,000 Units by mouth daily. 09/18/18  Yes [provider]  ELIQUIS 5 MG TABS tablet TAKE 1 TABLET BY MOUTH TWICE A DAY Patient taking differently: Take 5 mg by mouth 2 (two) times daily.  07/13/19  Yes O'Neal, Cassie Freer, MD  furosemide (LASIX) 80 MG tablet Take 80 mg by mouth 2 (two) times daily. 08/09/19  Yes [provider]  HUMALOG KWIKPEN 100 UNIT/ML KwikPen Inject 4-8 Units into the skin daily. Sliding Scale 01/11/19   Yes [provider]  hydrALAZINE (APRESOLINE) 100 MG tablet Take 100 mg by mouth 2 (two) times daily. 08/19/19  Yes [provider]  Insulin Glargine (BASAGLAR KWIKPEN) 100 UNIT/ML SOPN Inject 10 Units into the skin daily.  01/11/19  Yes [provider]  magnesium chloride (SLOW-MAG) 64 MG TBEC SR tablet Take 2 tablets by mouth 2 (two) times daily. TAKE AT LEAST 2 HOURS SEPARATE FROM PROGRAF/TACROLIMUS 10/02/14  Yes [provider]  mycophenolate (MYFORTIC) 180 MG EC tablet Take 360 mg by mouth 2 (two) times daily.    Yes [provider]  omeprazole (PRILOSEC) 40 MG capsule Take 40 mg by mouth at bedtime.  01/11/14  Yes [provider]  predniSONE (DELTASONE) 5 MG tablet Take 5 mg by mouth daily with breakfast.    Yes [provider]  sertraline (ZOLOFT) 50 MG tablet Take 50 mg by mouth daily. 08/15/19  Yes [provider]  sodium  bicarbonate 650 MG tablet Take 1,950 mg by mouth 4 (four) times daily. 09/21/18  Yes [provider]  tacrolimus (PROGRAF) 1 MG capsule Take 2-3 mg by mouth See admin instructions. Take 3 mg by mouth in the morning at 9 AM and 2 mg in the evening at 9 PM   Yes [provider]  tamsulosin (FLOMAX) 0.4 MG CAPS capsule Take 0.4 mg by mouth daily after supper.    Yes [provider]  torsemide (DEMADEX) 20 MG tablet Take 2 tablets (40 mg total) by mouth daily. Patient not taking: Reported on 10/12/2019 04/29/19 05/29/19  Arrien, Jimmy Picket, MD                                                                                                                                    Past Surgical History Past Surgical History:  Procedure Laterality Date  . AMPUTATION Bilateral 07/30/2013   Procedure: AMPUTATION BELOW KNEE;  Surgeon: Newt Minion, MD;  Location: [facility];  Service: Orthopedics;  Laterality: Bilateral;  Bilateral Below Knee Amputations  . AMPUTATION Bilateral 09/03/2013   Procedure:  AMPUTATION BELOW KNEE;  Surgeon: Newt Minion, MD;  Location: [facility];  Service: Orthopedics;  Laterality: Bilateral;  Bilateral Below Knee Amputation Revision  . AV FISTULA PLACEMENT  06/07/2011   Procedure: ARTERIOVENOUS (AV) FISTULA CREATION;  Surgeon: Angelia Mould, MD;  Location: [facility];  Service: Vascular;  Laterality: Left;  . AV FISTULA PLACEMENT  06/18/2012   Procedure: ARTERIOVENOUS (AV) FISTULA CREATION;  Surgeon: Mal Misty, MD;  Location: [facility];  Service: Vascular;  Laterality: Right;  [place]  . BILIARY DIVERSION, EXTERNAL  2002  . BONE MARROW BIOPSY  2012  . CAPD INSERTION N/A 01/14/2013   Procedure: LAPAROSCOPIC INSERTION CONTINUOUS AMBULATORY PERITONEAL DIALYSIS  (CAPD) CATHETER;  Surgeon: Adin Hector, MD;  Location: [facility];  Service: General;  Laterality: N/A;  . CAPD REMOVAL N/A 07/09/2013   Procedure: CONTINUOUS AMBULATORY PERITONEAL DIALYSIS  (CAPD) CATHETER REMOVAL;  Surgeon: Adin Hector, MD;  Location: [facility];  Service: General;  Laterality: N/A;  . COLONOSCOPY     Hx: of  . INSERTION OF DIALYSIS CATHETER  05/22/2012   Procedure: INSERTION OF DIALYSIS CATHETER;  Surgeon: Mal Misty, MD;  Location: [facility];  Service: Vascular;  Laterality: N/A;  right internal jugular vein  . IR ANGIO INTRA EXTRACRAN SEL COM CAROTID INNOMINATE BILAT MOD SED  10/28/2018  . IR ANGIO VERTEBRAL SEL SUBCLAVIAN INNOMINATE UNI R MOD SED  10/28/2018  . IR ANGIO VERTEBRAL SEL VERTEBRAL UNI L MOD SED  10/28/2018  . LIGATION OF ARTERIOVENOUS  FISTULA  05/22/2012   Procedure: LIGATION OF ARTERIOVENOUS  FISTULA;  Surgeon: Mal Misty, MD;  Location: [facility];  Service: Vascular;  Laterality: Left;  Left brachio-cephalic fistula  . LOWER EXTREMITY ANGIOGRAM N/A 06/14/2013   Procedure: LOWER EXTREMITY ANGIOGRAM;  Surgeon: Judeth Cornfield  Scot Dock, MD;  Location: [facility] CATH LAB;  Service: Cardiovascular;  Laterality: N/A;  . NEPHRECTOMY TRANSPLANTED ORGAN    . OTHER SURGICAL HISTORY     Cyst removed from  back  . PORTACATH PLACEMENT  2002  . RADIOLOGY WITH ANESTHESIA N/A 10/28/2018   Procedure: IR WITH ANESTHESIA;  Surgeon: Radiologist, Medication, MD;  Location: [facility];  Service: Radiology;  Laterality: N/A;  . REMOVAL OF A DIALYSIS CATHETER    . RENAL BIOPSY, PERCUTANEOUS  2012  . TONSILLECTOMY    . VASCULAR SURGERY    . WHIPPLE PROCEDURE  2002   duodenal ca, Dr. Harlow Asa   Family History Family History  Problem Relation Age of Onset  . Cancer Mother   . Cancer Father        prostate cancer  . Heart disease Maternal Grandfather   . Stroke Paternal Grandmother     Social History Social History   Tobacco Use  . Smoking status: Never Smoker  . Smokeless tobacco: Never Used  Substance Use Topics  . Alcohol use: No  . Drug use: No   Allergies Metformin and related  Review of Systems Review of Systems All other systems are reviewed and are negative for acute change except as noted in the HPI  Physical Exam Vital Signs  I have reviewed the triage vital signs BP (!) 184/85 (BP Location: Left Arm)   Pulse 69   Temp 99.5 F (37.5 C) (Oral)   Resp 15   SpO2 98%   Physical Exam Vitals reviewed.  Constitutional:      General: He is not in acute distress.    Appearance: He is well-developed. He is not diaphoretic.  HENT:     Head: Normocephalic and atraumatic.     Nose: Nose normal.  Eyes:     General: No scleral icterus.       Right eye: No discharge.        Left eye: No discharge.     Conjunctiva/sclera: Conjunctivae normal.     Pupils: Pupils are equal, round, and reactive to light.  Cardiovascular:     Rate and Rhythm: Normal rate and regular rhythm.     Heart sounds: No murmur. No friction rub. No gallop.   Pulmonary:     Effort: Pulmonary effort is normal. No respiratory distress.     Breath sounds: Normal breath sounds. No stridor. No rales.  Abdominal:     General: There is no distension.     Palpations: Abdomen is soft.     Tenderness: There is no  abdominal tenderness.  Musculoskeletal:        General: No tenderness.     Cervical back: Normal range of motion and neck supple.     Comments: Bilateral BKA  Skin:    General: Skin is warm and dry.     Findings: No erythema or rash.  Neurological:     Mental Status: He is alert and oriented to person, place, and time.     ED Results and Treatments Labs (all labs ordered are listed, but only abnormal results are displayed) Labs Reviewed  CBC WITH DIFFERENTIAL/PLATELET - Abnormal; Notable for the following components:      Result Value   RBC 3.64 (*)    Hemoglobin 10.6 (*)    HCT 35.2 (*)    Platelets 111 (*)    All other components within normal limits  COMPREHENSIVE METABOLIC PANEL - Abnormal; Notable for the following components:   Chloride 118 (*)  CO2 14 (*)    Glucose, Bld 164 (*)    BUN 35 (*)    Creatinine, Ser 3.59 (*)    Calcium 8.5 (*)    Total Protein 4.7 (*)    Albumin 2.5 (*)    AST 14 (*)    GFR calc non Af Amer 17 (*)    GFR calc Af Amer 19 (*)    All other components within normal limits  BRAIN NATRIURETIC PEPTIDE - Abnormal; Notable for the following components:   B Natriuretic Peptide 1,980.9 (*)    All other components within normal limits  URINALYSIS, ROUTINE W REFLEX MICROSCOPIC - Abnormal; Notable for the following components:   Glucose, UA 50 (*)    Protein, ur >=300 (*)    All other components within normal limits  CBG MONITORING, ED - Abnormal; Notable for the following components:   Glucose-Capillary 148 (*)    All other components within normal limits  SARS CORONAVIRUS 2 BY RT PCR (HOSPITAL ORDER, [facility])  TACROLIMUS LEVEL  MYCOPHENOLIC ACID (CELLCEPT)  POC SARS CORONAVIRUS 2 AG -  ED                                                                                                                         EKG  EKG Interpretation  Date/Time:  Monday Oct 11 2019 23:16:42 EDT Ventricular Rate:  68 PR  Interval:    QRS Duration: 85 QT Interval:  410 QTC Calculation: 436 R Axis:   -45 Text Interpretation: Sinus rhythm Left anterior fascicular block Anterior infarct, old No significant change since last tracing Confirmed by Addison Lank [phone]) on 10/12/2019 2:41:16 AM      Radiology DG Chest Port 1 View  Result Date: 10/12/2019 CLINICAL DATA:  Fatigue and generalized weakness EXAM: PORTABLE CHEST 1 VIEW COMPARISON:  04/23/2019 FINDINGS: Chronic cardiomegaly. There is no edema, consolidation, effusion, or pneumothorax. Extensive artifact from EKG leads. No acute osseous finding IMPRESSION: No evidence of acute disease. Electronically Signed   By: Monte Fantasia M.D.   On: 10/12/2019 06:17    Pertinent labs & imaging results that were available during my care of the patient were reviewed by me and considered in my medical decision making (see chart for details).  Medications Ordered in ED Medications  furosemide (LASIX) tablet 80 mg (has no administration in time range)  sodium chloride 0.9 % bolus 500 mL (0 mLs Intravenous Stopped 10/12/19 0435)  acetaminophen (TYLENOL) tablet 500 mg (500 mg Oral Given 10/12/19 0628)  amLODipine (NORVASC) tablet 10 mg (10 mg Oral Given 10/12/19 0629)  hydrALAZINE (APRESOLINE) tablet 100 mg (100 mg Oral Given 10/12/19 0630)  Procedures Procedures  (including critical care time)  Medical Decision Making / ED Course I have reviewed the nursing notes for this encounter and the patient's prior records (if available in EHR or on provided paperwork).   GLENNIE RODDA was evaluated in Emergency Department on 10/12/2019 for the symptoms described in the history of present illness. He was evaluated in the context of the global COVID-19 pandemic, which necessitated consideration that the patient might be at risk for infection with the  SARS-CoV-2 virus that causes COVID-19. Institutional protocols and algorithms that pertain to the evaluation of patients at risk for COVID-19 are in a state of rapid change based on information released by regulatory bodies including the CDC and federal and state organizations. These policies and algorithms were followed during the patient's care in the ED.  AFVSS cbg reassuring EKG reassuring  No leukocytosis. No infectious source.  CMP at baseline. BNP elevated, but h/o ESRD w/o prior for baseline. No SOB or hypoxia. No evidence of volume overload.  Will obtain cellcept and prograf levels.  Provided with IVF  Will need UA to assess for infection.  Patient developed a fever while in the ED. COVID and CXR ordered.  Rapid COVID negative. Will order PCR. CXR negative. UA negative.  Pending COVID and cellcept/prograf level. Anticipate DC if WNL.     Final Clinical Impression(s) / ED Diagnoses Final diagnoses:  Fever      This chart was dictated using voice recognition software.  Despite best efforts to proofread,  errors can occur which can change the documentation meaning.   Fatima Blank, MD 10/12/19 [phone]

## 2019-10-11 NOTE — ED Triage Notes (Signed)
Pt BIB [facility] from home c/o fatigue and generalized weakness. Pt states he has not felt good for the past couple of days. Pt states he is wheelchair bound but has noticed more weakness lately. Tonight pt was trying to transfer from the toilet to the wheelchair and he was too weak. Pt lowered himself to the ground. Pt states he is on a blood thinner but did not hit his head. Pt denies any pain N/V/D.

## 2019-10-12 ENCOUNTER — Emergency Department (HOSPITAL_COMMUNITY): Payer: Managed Care, Other (non HMO)

## 2019-10-12 DIAGNOSIS — B348 Other viral infections of unspecified site: Secondary | ICD-10-CM

## 2019-10-12 LAB — URINALYSIS, ROUTINE W REFLEX MICROSCOPIC
Bacteria, UA: NONE SEEN
Bilirubin Urine: NEGATIVE
Glucose, UA: 50 mg/dL — AB
Hgb urine dipstick: NEGATIVE
Ketones, ur: NEGATIVE mg/dL
Leukocytes,Ua: NEGATIVE
Nitrite: NEGATIVE
Protein, ur: >=300 mg/dL — AB
Specific Gravity, Urine: 1.011 (ref 1.005–1.030)
pH: 5 (ref 5.0–8.0)

## 2019-10-12 LAB — CBC WITH DIFFERENTIAL/PLATELET
Abs Immature Granulocytes: 0.02 10*3/uL (ref 0.00–0.07)
Basophils Absolute: 0 10*3/uL (ref 0.0–0.1)
Basophils Relative: 0 %
Eosinophils Absolute: 0 10*3/uL (ref 0.0–0.5)
Eosinophils Relative: 1 %
HCT: 35.2 % — ABNORMAL LOW (ref 39.0–52.0)
Hemoglobin: 10.6 g/dL — ABNORMAL LOW (ref 13.0–17.0)
Immature Granulocytes: 0 %
Lymphocytes Relative: 19 %
Lymphs Abs: 1.1 10*3/uL (ref 0.7–4.0)
MCH: 29.1 pg (ref 26.0–34.0)
MCHC: 30.1 g/dL (ref 30.0–36.0)
MCV: 96.7 fL (ref 80.0–100.0)
Monocytes Absolute: 0.9 10*3/uL (ref 0.1–1.0)
Monocytes Relative: 16 %
Neutro Abs: 3.7 10*3/uL (ref 1.7–7.7)
Neutrophils Relative %: 64 %
Platelets: 111 10*3/uL — ABNORMAL LOW (ref 150–400)
RBC: 3.64 MIL/uL — ABNORMAL LOW (ref 4.22–5.81)
RDW: 14 % (ref 11.5–15.5)
WBC: 5.9 10*3/uL (ref 4.0–10.5)
nRBC: 0 % (ref 0.0–0.2)

## 2019-10-12 LAB — COMPREHENSIVE METABOLIC PANEL
ALT: 15 U/L (ref 0–44)
AST: 14 U/L — ABNORMAL LOW (ref 15–41)
Albumin: 2.5 g/dL — ABNORMAL LOW (ref 3.5–5.0)
Alkaline Phosphatase: 54 U/L (ref 38–126)
Anion gap: 9 (ref 5–15)
BUN: 35 mg/dL — ABNORMAL HIGH (ref 8–23)
CO2: 14 mmol/L — ABNORMAL LOW (ref 22–32)
Calcium: 8.5 mg/dL — ABNORMAL LOW (ref 8.9–10.3)
Chloride: 118 mmol/L — ABNORMAL HIGH (ref 98–111)
Creatinine, Ser: 3.59 mg/dL — ABNORMAL HIGH (ref 0.61–1.24)
GFR calc Af Amer: 19 mL/min — ABNORMAL LOW (ref >60)
GFR calc non Af Amer: 17 mL/min — ABNORMAL LOW (ref >60)
Glucose, Bld: 164 mg/dL — ABNORMAL HIGH (ref 70–99)
Potassium: 4.9 mmol/L (ref 3.5–5.1)
Sodium: 141 mmol/L (ref 135–145)
Total Bilirubin: 0.9 mg/dL (ref 0.3–1.2)
Total Protein: 4.7 g/dL — ABNORMAL LOW (ref 6.5–8.1)

## 2019-10-12 LAB — RESPIRATORY PANEL BY PCR

## 2019-10-12 LAB — BRAIN NATRIURETIC PEPTIDE: B Natriuretic Peptide: 1980.9 pg/mL — ABNORMAL HIGH (ref 0.0–100.0)

## 2019-10-12 LAB — CBG MONITORING, ED: Glucose-Capillary: 148 mg/dL — ABNORMAL HIGH (ref 70–99)

## 2019-10-12 LAB — SARS CORONAVIRUS 2 BY RT PCR (HOSPITAL ORDER, PERFORMED IN ~~LOC~~ HOSPITAL LAB): SARS Coronavirus 2: NEGATIVE

## 2019-10-12 LAB — POC SARS CORONAVIRUS 2 AG -  ED: SARS Coronavirus 2 Ag: NEGATIVE

## 2019-10-12 MED ORDER — ACETAMINOPHEN 500 MG PO TABS
500.0000 mg | ORAL_TABLET | Freq: Once | ORAL | Status: AC
  Administered 2019-10-12: 500 mg via ORAL
  Filled 2019-10-12: qty 1

## 2019-10-12 MED ORDER — SODIUM CHLORIDE 0.9 % IV BOLUS
500.0000 mL | Freq: Once | INTRAVENOUS | Status: AC
  Administered 2019-10-12: 500 mL via INTRAVENOUS

## 2019-10-12 MED ORDER — FUROSEMIDE 20 MG PO TABS
80.0000 mg | ORAL_TABLET | Freq: Once | ORAL | Status: DC
  Filled 2019-10-12: qty 4

## 2019-10-12 MED ORDER — AMLODIPINE BESYLATE 5 MG PO TABS
10.0000 mg | ORAL_TABLET | Freq: Once | ORAL | Status: AC
  Administered 2019-10-12: 10 mg via ORAL
  Filled 2019-10-12: qty 2

## 2019-10-12 MED ORDER — HYDRALAZINE HCL 25 MG PO TABS
100.0000 mg | ORAL_TABLET | Freq: Once | ORAL | Status: AC
  Administered 2019-10-12: 100 mg via ORAL
  Filled 2019-10-12: qty 4

## 2019-10-12 NOTE — Progress Notes (Signed)
Discussed case with Dr.Delo.  Patient presented with complaints of cough and found to have low-grade fever of 100.9 F.  Due to patient being status post renal transplant on chronic immunosuppression there was concern to possibly discharge home.  Chest x-ray and urinalysis without signs of infection.  Recommended obtaining a respiratory virus panel. Patient had been given Tylenol and some IV fluids.  Respiratory virus panel significant for rhinovirus.  Blood cultures had been obtained.  At this point discussed case with on-call provider for infectious disease who agreed with possible discharge recommending symptomatic treatment.

## 2019-10-12 NOTE — ED Provider Notes (Addendum)
Care assumed from Dr. Leonette Monarch at shift change.  Patient with history of end-stage renal disease status post renal transplant on antirejection medication, duodenal adenocarcinoma with resection and chemotherapy in remission.  He presents today with complaints of weakness.  Care signed out to me awaiting results of a tacrolimus and CellCept level.  Patient has received IV fluids and is feeling improved.  His laboratory studies are otherwise unremarkable and renal function is at baseline.  Covid test has returned negative.  In speaking with the lab, these tests are send outs and will not be back for several days.    As the patient is immunocompromised and febrile, I did discuss the case with Dr. Tamala Julian from the hospitalist service.  He has suggested obtaining a viral respiratory panel which has been completed.  He has tested positive for rhinovirus.  He has no other symptoms than weakness and cough and I suspect rhinovirus may be the cause of his illness.  Dr. Tamala Julian discussed this finding with infectious disease who does not feel as though admission is indicated.  Patient did have blood cultures obtained.  If these turn positive, the patient will be notified and will return.   Veryl Speak, MD 10/12/19 [id number]    Veryl Speak, MD 10/14/19 [phone]

## 2019-10-12 NOTE — Discharge Instructions (Addendum)
Continue medications as previously prescribed.  Your Covid test was negative today and your kidney function is consistent with baseline.  The results of your tacrolimus and CellCept levels will be known in the next few days.  Return to the emergency department if you develop high fever, worsening weakness, difficulty breathing, or other new and concerning symptoms.  We will call you if your cultures indicate you require further treatment or need to take additional action.

## 2019-10-12 NOTE — ED Notes (Signed)
Woke pt to ask for urine, stated he would try.  No pain or needs at this time.

## 2019-10-13 LAB — MYCOPHENOLIC ACID (CELLCEPT)
MPA Glucuronide: 6 ug/mL — ABNORMAL LOW (ref 15–125)
MPA: <0.1 ug/mL — ABNORMAL LOW (ref 1.0–3.5)

## 2019-10-14 LAB — TACROLIMUS LEVEL: Tacrolimus (FK506) - LabCorp: <1 ng/mL — ABNORMAL LOW (ref 2.0–20.0)

## 2019-10-17 LAB — CULTURE, BLOOD (ROUTINE X 2): Culture: NO GROWTH

## 2019-10-25 ENCOUNTER — Ambulatory Visit: Payer: Managed Care, Other (non HMO) | Attending: Family Medicine | Admitting: Rehabilitation

## 2019-10-25 ENCOUNTER — Encounter: Payer: Self-pay | Admitting: Rehabilitation

## 2019-10-25 ENCOUNTER — Other Ambulatory Visit: Payer: Self-pay

## 2019-10-25 DIAGNOSIS — Z9181 History of falling: Secondary | ICD-10-CM | POA: Diagnosis present

## 2019-10-25 DIAGNOSIS — R531 Weakness: Secondary | ICD-10-CM

## 2019-10-25 DIAGNOSIS — R2689 Other abnormalities of gait and mobility: Secondary | ICD-10-CM | POA: Diagnosis not present

## 2019-10-25 DIAGNOSIS — M25651 Stiffness of right hip, not elsewhere classified: Secondary | ICD-10-CM | POA: Diagnosis present

## 2019-10-25 DIAGNOSIS — R293 Abnormal posture: Secondary | ICD-10-CM | POA: Diagnosis present

## 2019-10-25 DIAGNOSIS — M25652 Stiffness of left hip, not elsewhere classified: Secondary | ICD-10-CM | POA: Diagnosis present

## 2019-10-25 DIAGNOSIS — M6281 Muscle weakness (generalized): Secondary | ICD-10-CM | POA: Diagnosis present

## 2019-10-25 DIAGNOSIS — R2681 Unsteadiness on feet: Secondary | ICD-10-CM | POA: Diagnosis present

## 2019-10-25 NOTE — Therapy (Signed)
[facility] [address] [place], [place], [address] Phone: [phone]   Fax:  [phone]  Physical Therapy Treatment  Patient Details  Name: Paul [name] MRN: [id number] Date of Birth: 1956-01-07 Referring Provider (PT): [name], DO   Encounter Date: 10/25/2019  PT End of Session - 10/25/19 1031    Visit Number  2    Number of Visits  25    Date for PT Re-Evaluation  01/04/20    Authorization Type  [employer] Managed Medicare A & B    PT Start Time  1027   pt late to appt   PT Stop Time  1100    PT Time Calculation (min)  33 min    Equipment Utilized During Treatment  Gait belt    Activity Tolerance  Patient tolerated treatment well;Other (comment)   limited by light-headedness   Behavior During Therapy  [facility] for tasks assessed/performed       Past Medical History:  Diagnosis Date  . A-fib ([place])   . Anemia    esrd  . Calciphylaxis 05/2013   lower ext  . Cancer ([place])    hx duodenal adenoCa 2002, resected  . Closed left arm fracture 1967  . Corneal abrasion, left   . Depression   . Diabetes mellitus    Type 1 iddm x 11 yrs  . Diabetic neuropathy ([place])   . ESRD on hemodialysis ([place])    Pt has ESRD due to DM.  He had a L upper arm AVF prior to starting HD which was ligated due to L arm steal syndrome.  He started HD in Jan 2014 and did home HD.  The family couldn't cannulate the R arm AVF successfully so by mid 2014 they decided to switch to PD which was done late summer 2014.  In Jan 2015 he was admitted with FTT felt to be due to underdialysis on PD and PD was abandoned and he   . GERD (gastroesophageal reflux disease)   . Glomerulosclerosis, diabetic ([place])   . Heart murmur   . History of blood transfusion   . Hypertension    off of meds due to orthostatic hypotension  . Kidney transplant recipient   . Open wound of both legs with complication    Pt has had progressive wounds of both LE's including  gangrene of the toes and patchy necrosis of the calves.  He has been treated at [facility] by Dr Jerline Pain with hyperbaric O2 5d per week.  He is getting Na Thiosulfate with HD for suspected calciphylaxis. Pt says doctor's aren't sure if these ulcers were diabetic ulcers or calciphylaxis.  He underwent bilat BKA on 07/30/13.  He says that the woun    Past Surgical History:  Procedure Laterality Date  . AMPUTATION Bilateral 07/30/2013   Procedure: AMPUTATION BELOW KNEE;  Surgeon: Newt Minion, MD;  Location: [facility];  Service: Orthopedics;  Laterality: Bilateral;  Bilateral Below Knee Amputations  . AMPUTATION Bilateral 09/03/2013   Procedure: AMPUTATION BELOW KNEE;  Surgeon: Newt Minion, MD;  Location: [facility];  Service: Orthopedics;  Laterality: Bilateral;  Bilateral Below Knee Amputation Revision  . AV FISTULA PLACEMENT  06/07/2011   Procedure: ARTERIOVENOUS (AV) FISTULA CREATION;  Surgeon: Angelia Mould, MD;  Location: [facility];  Service: Vascular;  Laterality: Left;  . AV FISTULA PLACEMENT  06/18/2012   Procedure: ARTERIOVENOUS (AV) FISTULA CREATION;  Surgeon: Mal Misty, MD;  Location: [facility];  Service: Vascular;  Laterality: Right;  CIMINO  . BILIARY DIVERSION, EXTERNAL  2002  . BONE MARROW BIOPSY  2012  . CAPD INSERTION N/A 01/14/2013   Procedure: LAPAROSCOPIC INSERTION CONTINUOUS AMBULATORY PERITONEAL DIALYSIS  (CAPD) CATHETER;  Surgeon: Adin Hector, MD;  Location: [facility];  Service: General;  Laterality: N/A;  . CAPD REMOVAL N/A 07/09/2013   Procedure: CONTINUOUS AMBULATORY PERITONEAL DIALYSIS  (CAPD) CATHETER REMOVAL;  Surgeon: Adin Hector, MD;  Location: [facility];  Service: General;  Laterality: N/A;  . COLONOSCOPY     Hx: of  . INSERTION OF DIALYSIS CATHETER  05/22/2012   Procedure: INSERTION OF DIALYSIS CATHETER;  Surgeon: Mal Misty, MD;  Location: [facility];  Service: Vascular;  Laterality: N/A;  right internal jugular vein  . IR ANGIO INTRA EXTRACRAN SEL COM CAROTID INNOMINATE  BILAT MOD SED  10/28/2018  . IR ANGIO VERTEBRAL SEL SUBCLAVIAN INNOMINATE UNI R MOD SED  10/28/2018  . IR ANGIO VERTEBRAL SEL VERTEBRAL UNI L MOD SED  10/28/2018  . LIGATION OF ARTERIOVENOUS  FISTULA  05/22/2012   Procedure: LIGATION OF ARTERIOVENOUS  FISTULA;  Surgeon: Mal Misty, MD;  Location: [facility];  Service: Vascular;  Laterality: Left;  Left brachio-cephalic fistula  . LOWER EXTREMITY ANGIOGRAM N/A 06/14/2013   Procedure: LOWER EXTREMITY ANGIOGRAM;  Surgeon: Angelia Mould, MD;  Location[facility] CATH LAB;  Service: Cardiovascular;  Laterality: N/A;  . NEPHRECTOMY TRANSPLANTED ORGAN    . OTHER SURGICAL HISTORY     Cyst removed from back  . PORTACATH PLACEMENT  2002  . RADIOLOGY WITH ANESTHESIA N/A 10/28/2018   Procedure: IR WITH ANESTHESIA;  Surgeon: Radiologist, Medication, MD;  Location: [facility];  Service: Radiology;  Laterality: N/A;  . REMOVAL OF A DIALYSIS CATHETER    . RENAL BIOPSY, PERCUTANEOUS  2012  . TONSILLECTOMY    . VASCULAR SURGERY    . WHIPPLE PROCEDURE  2002   duodenal ca, Dr. Harlow Asa    There were no vitals filed for this visit.  Subjective Assessment - 10/25/19 1030    Subjective  Pt reports no changes.  Did have an ED visit for fever, was dehydrated.  Doing well now.,    Pertinent History  HTN, DM2, renal transplant, CKD st IV, calciphylaxis, A-Fib, bil. TTAs, TIA    Patient Stated Goals  He would like to get off walker completely. Get close to prior level with prostheses which means no device in home & cane in community.    Currently in Pain?  No/denies                        [facility] Adult PT Treatment/Exercise - 10/25/19 1036      Transfers   Transfers  Sit to Stand;Stand to Sit    Sit to Stand  5: Supervision    Sit to Stand Details (indicate cue type and reason)  Heavy reliance on UEs to stand     Stand to Sit  5: Supervision;With upper extremity assist;With armrests;To chair/3-in-1    Stand to Sit Details  Heavy reliance on UEs to sit        Ambulation/Gait   Ambulation/Gait  Yes    Ambulation/Gait Assistance  5: Supervision    Ambulation/Gait Assistance Details  Pt ambulatory into clinic with RW at S level.  Ambulatory with quad tip cane around gym during session with min cues for placement and posture througout.  note tightness in L knee as it remains in flexion, however  did note that L pylon is set posterior on socket and pt reports this was done due to overt hyperextension in the past,     Ambulation Distance (Feet)  30 Feet   x 3 reps    Assistive device  Prostheses;Straight cane;Rolling walker    Ambulation Surface  Level;Indoor      Self-Care   Self-Care  Other Self-Care Comments    Other Self-Care Comments   Discussed return to gym.  He has not called to see if Christella Scheuermann will cover but plans to return to [employer].  Performed seated sci fit stepper and educated on how to progress time while maintaining rpms.  Reaching to get 5-6/10 fatigue before resting.  Recommended adding time over 4-7 days and not increasing resistance until approx 10 mins.  Pt verbalized understanding.  Also performed seated leg press which he can do at gym.  Discussed doing leg extension/curls, however may be difficult to do extension due to prostheses and short residual limb, but pt to try.  Recommended seated hip abd/add machine, seated row machine, lat pull down, bicep/tricep.  Also recommended getting trainer for 1-2 sessions in order to ensure proper technique and set up correctly.  Pt verbalized understanding.       Exercises   Exercises  Knee/Hip      Knee/Hip Exercises: Aerobic   Stepper  Sci fit stepper x 5 mins at level 2 with BUE/LEs maintaining rpms in 60's throughout.  No rest break needed.        Knee/Hip Exercises: Machines for Strengthening   Cybex Leg Press  40 lbs BLE x 10 reps, 60 lbs x 10 reps BLE      Knee/Hip Exercises: Standing   Hip Flexion  Stengthening;Both;1 set;10 reps    Hip Flexion Limitations  Did 5 reps with  counter only and 5 reps with counter and cane in opposite hand due to fatigue.     Hip Abduction  Stengthening;Both;1 set;10 reps    Hip Extension  Stengthening;Both;1 set;10 reps         Access Code: [email or web address] URL: [email or web address] Date: 10/25/2019 Prepared by: Cameron Sprang  Exercises Standing Marching - 1-2 x daily - 7 x weekly - 1 sets - 10 reps Standing Hip Abduction with Counter Support - 1-2 x daily - 7 x weekly - 1 sets - 10 reps Standing Hip Extension with Counter Support - 1-2 x daily - 7 x weekly - 1 sets - 10 reps      PT Education - 10/25/19 1114    Education Details  see self care, HEP    Person(s) Educated  Patient    Methods  Explanation;Demonstration;Handout    Comprehension  Verbalized understanding;Returned demonstration;Need further instruction       PT Short Term Goals - 10/06/19 1438      PT SHORT TERM GOAL #1   Title  Patient demonstrates & verbalizes understanding of initial HEP. (All STGs Target Date 11/19/2019)    Time  4   Pt going to his beach house twice so STGs set for 4 weeks of care   Status  New    Target Date  11/19/19      PT SHORT TERM GOAL #2   Title  Berg Balance >/= 36/56    Time  4    Period  Weeks    Status  New    Target Date  11/19/19      PT SHORT TERM GOAL #3   Title  Dynamic  Gait Index with cane >/= 12/24 with supervision    Time  4    Period  Weeks    Status  New    Target Date  11/19/19      PT SHORT TERM GOAL #4   Title  Patient ambulates 200' with cane & prostheses with supervision.    Time  4    Period  Weeks    Status  New    Target Date  11/19/19      PT SHORT TERM GOAL #5   Title  Patient negotiates ramps, curbs & stairs single rail with cane & prostheses with minA.    Time  4    Period  Weeks    Status  New    Target Date  11/19/19        PT Long Term Goals - 10/06/19 1438      PT LONG TERM GOAL #1   Title  Patient verbalizes & demonstrates understanding of ongoing fitness  plan including home exercises & fitness center exercise program.  (All LTGs Target Date: 12/17/2019)    Time  8    Period  Weeks    Status  New    Target Date  12/17/19      PT LONG TERM GOAL #2   Title  Berg >/= 45/56    Time  8    Period  Weeks    Status  New    Target Date  12/17/19      PT LONG TERM GOAL #3   Title  Dynamic Gait Index with cane >/= 19/24 to indicate lower fall risk.    Time  8    Period  Weeks    Status  New    Target Date  12/17/19      PT LONG TERM GOAL #4   Title  Patient ambulates >400' with cane & prostheses modified independent.    Time  8    Period  Weeks    Status  New    Target Date  12/17/19      PT LONG TERM GOAL #5   Title  Patient negotiates ramps, curbs & stairs with single rail with cane & prostheses modified independent.    Time  8    Period  Weeks    Status  New    Target Date  12/17/19      PT LONG TERM GOAL #6   Title  Patient ambulates 30' with prostheses only around furniture modified independent.    Time  8    Period  Weeks    Status  New    Target Date  12/17/19            Plan - 10/25/19 1139    Clinical Impression Statement  Skilled session focused on exercises pt can perform safely at [employer] with seated scifit, leg press, along with initiation of HEP for hip strengthening.  Pt moderately fatigued at end of session, but did very well overall.    Personal Factors and Comorbidities  Comorbidity 3+    Comorbidities  HTN, DM2, renal transplant, CKD st IV, calciphylaxis, A-Fib, bil. TTAs, TIA    Examination-Activity Limitations  Locomotion Level;Squat;Stairs;Stand;Transfers    Examination-Participation Restrictions  Community Activity    Stability/Clinical Decision Making  Evolving/Moderate complexity    Rehab Potential  Good    PT Frequency  2x / week    PT Duration  8 weeks    PT Treatment/Interventions  ADLs/Self Care Home   Management;DME Instruction;Gait training;Stair training;Functional mobility  training;Therapeutic activities;Therapeutic exercise;Balance training;Neuromuscular re-education;Patient/family education;Prosthetic Training;Manual techniques;Vestibular    PT Next Visit Plan  Add hamstring stretch to HEP, add sit<>stand and balance to HEP    Consulted and Agree with Plan of Care  Patient       Patient will benefit from skilled therapeutic intervention in order to improve the following deficits and impairments:  Abnormal gait, Cardiopulmonary status limiting activity, Decreased activity tolerance, Decreased balance, Decreased endurance, Decreased mobility, Decreased range of motion, Decreased strength, Impaired flexibility, Postural dysfunction, Prosthetic Dependency  Visit Diagnosis: Other abnormalities of gait and mobility  Unsteadiness on feet  Abnormal posture  Weakness generalized     Problem List Patient Active Problem List   Diagnosis Date Noted  . CKD stage 4 due to type 2 diabetes mellitus ([place]) 04/29/2019  . AKI (acute kidney injury) ([place]) 04/24/2019  . Hyperglycemia 04/24/2019  . Nausea and vomiting 04/24/2019  . Fever 04/23/2019  . Acute metabolic encephalopathy 41/74/0814  . Stroke ([place]) 10/28/2018  . Middle cerebral artery embolism, left 10/28/2018  . Metabolic acidosis 48/18/5631  . Dehydration 05/24/2018  . Pressure injury of skin 05/24/2018  . Dyspnea 05/23/2018  . Acute respiratory failure with hypoxia ([place]) 05/23/2018  . Abnormal cardiovascular stress test 12/29/2013  . Awaiting organ transplant 12/09/2013  . Leg ulcer ([place]) 11/17/2013  . Chronic kidney disease (CKD), stage V ([place]) 11/17/2013  . Diabetes ([place]) 11/17/2013  . Cancer of duodenum ([place]) 11/17/2013  . Dyslipidemia 11/17/2013  . Chronic kidney disease requiring chronic dialysis ([place]) 11/17/2013  . Hypercholesteremia 11/17/2013  . H/O malignant neoplasm 11/17/2013  . H/O: HTN (hypertension) 11/17/2013  . BP (high blood pressure) 11/17/2013  . Hypotension 11/17/2013  .  Angiopathy, peripheral ([place]) 11/17/2013  . AF (paroxysmal atrial fibrillation) ([place]) 11/17/2013  . BKA stump complication ([place]) 49/70/2637  . S/P bilateral BKA (below knee amputation) ([place]) 08/03/2013  . S/P BKA (below knee amputation) bilateral ([place]) 07/30/2013  . Open wound of both legs with complication 85/88/5027  . Acute blood loss anemia 07/12/2013  . Syncope 07/09/2013  . Intolerance to CAPD peritoneal dialysis s/p CAPD cath removal 07/09/2013 07/05/2013  . Critical lower limb ischemia 06/30/2013  . Extremity pain 06/30/2013  . Atherosclerosis of native arteries of the extremities with ulceration(440.23) 06/17/2013  . Gait disorder 05/31/2013  . Weakness 05/24/2013  . Depression 04/03/2013  . GERD (gastroesophageal reflux disease) 04/03/2013  . Atrial fibrillation ([place]) 04/02/2013  . DM (diabetes mellitus) ([place]) 12/28/2012  . Other complications due to renal dialysis device, implant, and graft 05/19/2012  . Diabetes mellitus, type 2 ([place]) 11/13/2011  . Anemia 07/02/2011  . ESRD on hemodialysis ([place]) 05/29/2011    Cameron Sprang, PT, MPT [facility] [address] [place], [place], [address] Phone: [phone]   Fax:  [phone] 10/25/19, 11:41 AM  Name: Paul [name] MRN: [id number] Date of Birth: [birth year]

## 2019-10-25 NOTE — Patient Instructions (Signed)
Access Code: [email or web address] URL: [email or web address] Date: 10/25/2019 Prepared by: Cameron Sprang  Exercises Standing Marching - 1-2 x daily - 7 x weekly - 1 sets - 10 reps Standing Hip Abduction with Counter Support - 1-2 x daily - 7 x weekly - 1 sets - 10 reps Standing Hip Extension with Counter Support - 1-2 x daily - 7 x weekly - 1 sets - 10 reps

## 2019-10-29 ENCOUNTER — Ambulatory Visit: Payer: Managed Care, Other (non HMO) | Admitting: Rehabilitative and Restorative Service Providers"

## 2019-10-29 ENCOUNTER — Other Ambulatory Visit: Payer: Self-pay

## 2019-10-29 ENCOUNTER — Encounter: Payer: Self-pay | Admitting: Rehabilitative and Restorative Service Providers"

## 2019-10-29 DIAGNOSIS — Z9181 History of falling: Secondary | ICD-10-CM

## 2019-10-29 DIAGNOSIS — M6281 Muscle weakness (generalized): Secondary | ICD-10-CM

## 2019-10-29 DIAGNOSIS — R2689 Other abnormalities of gait and mobility: Secondary | ICD-10-CM | POA: Diagnosis not present

## 2019-10-29 DIAGNOSIS — M25652 Stiffness of left hip, not elsewhere classified: Secondary | ICD-10-CM

## 2019-10-29 DIAGNOSIS — R531 Weakness: Secondary | ICD-10-CM

## 2019-10-29 DIAGNOSIS — R2681 Unsteadiness on feet: Secondary | ICD-10-CM

## 2019-10-29 DIAGNOSIS — M25651 Stiffness of right hip, not elsewhere classified: Secondary | ICD-10-CM

## 2019-10-29 NOTE — Therapy (Signed)
[facility] [address] [place], [place], [address] Phone: [phone]   Fax:  [phone]  Physical Therapy Treatment  Patient Details  Name: Paul [name] MRN: [id number] Date of Birth: 10/17/1955 Referring Provider (PT): Jessica Vann, DO   Encounter Date: 10/29/2019   PT End of Session - 10/29/19 1535    Visit Number 3    Number of Visits 25    Date for PT Re-Evaluation 01/04/20    Authorization Type [employer]aged [employer] A & B    PT Start Time 1240   patient arrived late   PT Stop Time 1311    PT Time Calculation (min) 31 min    Equipment Utilized During Treatment Gait belt    Activity Tolerance Patient tolerated treatment well;Patient limited by fatigue   limited by light-headedness   Behavior During Therapy WFL for tasks assessed/performed           Past Medical History:  Diagnosis Date  . A-fib (HCC)   . Anemia    esrd  . Calciphylaxis 05/2013   lower ext  . Cancer (HCC)    hx duodenal adenoCa 2002, resected  . Closed left arm fracture 1967  . Corneal abrasion, left   . Depression   . Diabetes mellitus    Type 1 iddm x 11 yrs  . Diabetic neuropathy (HCC)   . ESRD on hemodialysis (HCC)    Pt has ESRD due to DM.  He had a L upper arm AVF prior to starting HD which was ligated due to L arm steal syndrome.  He started HD in Jan 2014 and did home HD.  The family couldn't cannulate the R arm AVF successfully so by mid 2014 they decided to switch to PD which was done late summer 2014.  In Jan 2015 he was admitted with FTT felt to be due to underdialysis on PD and PD was abandoned and he   . GERD (gastroesophageal reflux disease)   . Glomerulosclerosis, diabetic (HCC)   . Heart murmur   . History of blood transfusion   . Hypertension    off of meds due to orthostatic hypotension  . Kidney transplant recipient   . Open wound of both legs with complication    Pt has had progressive wounds of both LE's  including gangrene of the toes and patchy necrosis of the calves.  [facility] with hyperbaric O2 5d per week.  He is getting Na Thiosulfate with HD for suspected calciphylaxis. Pt says doctor's aren't sure if these ulcers were diabetic ulcers or calciphylaxis.  He underwent bilat BKA on 07/30/13.  He says that the woun    Past Surgical History:  Procedure Laterality Date  . AMPUTATION Bilateral 07/30/2013   Procedure: AMPUTATION BELOW KNEE;  Surgeon: Marcus V Duda, MD;  Location: [facility];  Service: Orthopedics;  Laterality: Bilateral;  Bilateral Below Knee Amputations  . AMPUTATION Bilateral 09/03/2013   Procedure: AMPUTATION BELOW KNEE;  Surgeon: Marcus V Duda, MD;  Location: [facility];  Service: Orthopedics;  Laterality: Bilateral;  Bilateral Below Knee Amputation Revision  . AV FISTULA PLACEMENT  06/07/2011   Procedure: ARTERIOVENOUS (AV) FISTULA CREATION;  Surgeon: Christopher S Dickson, MD;  [facility]: [facility];  Service: Vascular;  Laterality: Left;  . AV FISTULA PLACEMENT  06/18/2012   Procedure: ARTERIOVENOUS (AV) FISTULA CREATION;  Surgeon: James D Lawson, MD;  [facility]: [facility];  Service: Vascular;  Laterality: Right;    CIMINO  . BILIARY DIVERSION, EXTERNAL  2002  . BONE MARROW BIOPSY  2012  . CAPD INSERTION N/A 01/14/2013   Procedure: LAPAROSCOPIC INSERTION CONTINUOUS AMBULATORY PERITONEAL DIALYSIS  (CAPD) CATHETER;  Surgeon: Adin Hector, MD;  Location: [place];  Service: General;  Laterality: N/A;  . CAPD REMOVAL N/A 07/09/2013   Procedure: CONTINUOUS AMBULATORY PERITONEAL DIALYSIS  (CAPD) CATHETER REMOVAL;  Surgeon: Adin Hector, MD;  Location: [place];  Service: General;  Laterality: N/A;  . COLONOSCOPY     Hx: of  . INSERTION OF DIALYSIS CATHETER  05/22/2012   Procedure: INSERTION OF DIALYSIS CATHETER;  Surgeon: Mal Misty, MD;  Location: [place];  Service: Vascular;  Laterality: N/A;  right internal jugular vein  . IR ANGIO INTRA EXTRACRAN SEL COM CAROTID  INNOMINATE BILAT MOD SED  10/28/2018  . IR ANGIO VERTEBRAL SEL SUBCLAVIAN INNOMINATE UNI R MOD SED  10/28/2018  . IR ANGIO VERTEBRAL SEL VERTEBRAL UNI L MOD SED  10/28/2018  . LIGATION OF ARTERIOVENOUS  FISTULA  05/22/2012   Procedure: LIGATION OF ARTERIOVENOUS  FISTULA;  Surgeon: Mal Misty, MD;  Location: [place];  Service: Vascular;  Laterality: Left;  Left brachio-cephalic fistula  . LOWER EXTREMITY ANGIOGRAM N/A 06/14/2013   Procedure: LOWER EXTREMITY ANGIOGRAM;  Surgeon: Angelia Mould, MD;  Location: [facility] CATH LAB;  Service: Cardiovascular;  Laterality: N/A;  . NEPHRECTOMY TRANSPLANTED ORGAN    . OTHER SURGICAL HISTORY     Cyst removed from back  . PORTACATH PLACEMENT  2002  . RADIOLOGY WITH ANESTHESIA N/A 10/28/2018   Procedure: IR WITH ANESTHESIA;  Surgeon: Radiologist, Medication, MD;  Location: [place];  Service: Radiology;  Laterality: N/A;  . REMOVAL OF A DIALYSIS CATHETER    . RENAL BIOPSY, PERCUTANEOUS  2012  . TONSILLECTOMY    . VASCULAR SURGERY    . WHIPPLE PROCEDURE  2002   duodenal ca, Dr. Harlow Asa    There were no vitals filed for this visit.   Subjective Assessment - 10/29/19 1240    Subjective Patient reports he is very fatigued today. His mother in law fell overnight. She is in rehab, and patient's wife had to go take care of her. Patient was awake after this. Very fatigued today. Patient reports cmpliance with HEP once, but states he needs to work on getting into the habit of using/doing them more often.    Pertinent History HTN, DM2, renal transplant, CKD st IV, calciphylaxis, A-Fib, bil. TTAs, TIA    Patient Stated Goals He would like to get off walker completely. Get close to prior level with prostheses which means no device in home & cane in community.    Currently in Pain? No/denies              [facility] PT Assessment - 10/29/19 1244      Transfers   Sit to Stand Details (indicate cue type and reason) strong dependence on B hands to push up     Stand to Sit  Details heavy reliance on B hands to sit                         [facility] Adult PT Treatment/Exercise - 10/29/19 1244      Transfers   Transfers Sit to Stand;Stand to Sit    Sit to Stand 5: Supervision    Stand to Sit 5: Supervision;With upper extremity assist;With armrests;To chair/3-in-1      Ambulation/Gait   Ambulation/Gait Yes  Ambulation/Gait Assistance 5: Supervision;4: Min guard;4: Min assist    Ambulation/Gait Assistance Details patient ambulatd into PT clinic today with S with large base SPC reporting immediately he was very fatigued due to having a bad night of sleep due to a health issue (fall) of his mother in law. This fatigue was very limiting to today's session.     Ambulation Distance (Feet) 125 Feet   125 x 1, 50 x 3    Assistive device Prostheses;Straight cane;Rolling walker    Gait Pattern Step-through pattern;Decreased arm swing - left;Decreased stride length;Left flexed knee in stance;Antalgic;Lateral hip instability;Trunk flexed;Wide base of support    Ambulation Surface Level;Indoor;Outdoor;Paved    Gait Comments Due to patient's presentation of strong fatigue today due to a poor night's sleep (see gait details, above, and subjective) - PT ambulated with patient to car at end of session for safety - requires cueing to widen base of support especially when fatigued/not attending carefully to mechanics and required up to min A when trying to pull car key out of pocket and press button on key to unlock car (was looking down at key when this occurred)       Self-Care   Self-Care --    Other Self-Care Comments  --      Neuro Re-ed    Neuro Re-ed Details  At counter fwd/back walking with unilateral upper extremity support x 6 laps; fwd foot taps on bottom shelf of cabinets (performed this at cabinets as patient's stairs at home don't have B railings that reach base of steps for him to have BUE support during this exercise, which was a trial for HEP) x 8  bilaterally. Multiple seated rest breaks required throughout these exercises due to fatigue - denied any pain, but was clearly very fatigued due to poor sleep.       Exercises   Exercises Knee/Hip      Knee/Hip Exercises: Stretches   Active Hamstring Stretch Right;Left;3 reps;20 seconds   seated edge of mat   Active Hamstring Stretch Limitations cueing required for active quads to foster knee extension and proper pelvic potision       Knee/Hip Exercises: Aerobic   Stepper --      Knee/Hip Exercises: Machines for Strengthening   Cybex Leg Press --      Knee/Hip Exercises: Standing   Hip Flexion --    Hip Flexion Limitations --    Hip Abduction --    Hip Extension --                  PT Education - 10/29/19 1532    Education Details update to HEP with stretch (see patient instructions - bolded exercise is new exercise)    Person(s) Educated Patient    Methods Explanation;Demonstration;Tactile cues;Verbal cues;Handout   issued handout via email - printer was not connecting to PT's computer)   Comprehension Verbalized understanding;Need further instruction            PT Short Term Goals - 10/06/19 1438      PT SHORT TERM GOAL #1   Title Patient demonstrates & verbalizes understanding of initial HEP. (All STGs Target Date 11/19/2019)    Time 4   Pt going [place] his beach house twice so STGs set for 4 weeks of care   Status New    Target Date 11/19/19      PT SHORT TERM GOAL #2   Title Berg Balance >/= 36/56    Time 4      Period Weeks    Status New    Target Date 11/19/19      PT SHORT TERM GOAL #3   Title Dynamic Gait Index with cane >/= 12/24 with supervision    Time 4    Period Weeks    Status New    Target Date 11/19/19      PT SHORT TERM GOAL #4   Title Patient ambulates 200' with cane & prostheses with supervision.    Time 4    Period Weeks    Status New    Target Date 11/19/19      PT SHORT TERM GOAL #5   Title Patient negotiates ramps, curbs & stairs  single rail with cane & prostheses with minA.    Time 4    Period Weeks    Status New    Target Date 11/19/19             PT Long Term Goals - 10/06/19 1438      PT LONG TERM GOAL #1   Title Patient verbalizes & demonstrates understanding of ongoing fitness plan including home exercises & fitness center exercise program.  (All LTGs Target Date: 12/17/2019)    Time 8    Period Weeks    Status New    Target Date 12/17/19      PT LONG TERM GOAL #2   Title Berg >/= 45/56    Time 8    Period Weeks    Status New    Target Date 12/17/19      PT LONG TERM GOAL #3   Title Dynamic Gait Index with cane >/= 19/24 to indicate lower fall risk.    Time 8    Period Weeks    Status New    Target Date 12/17/19      PT LONG TERM GOAL #4   Title Patient ambulates >400' with cane & prostheses modified independent.    Time 8    Period Weeks    Status New    Target Date 12/17/19      PT LONG TERM GOAL #5   Title Patient negotiates ramps, curbs & stairs with single rail with cane & prostheses modified independent.    Time 8    Period Weeks    Status New    Target Date 12/17/19      PT LONG TERM GOAL #6   Title Patient ambulates 64' with prostheses only around furniture modified independent.    Time 8    Period Weeks    Status New    Target Date 12/17/19                 Plan - 10/29/19 1539    Clinical Impression Statement Today's skilled session focused on an attempt to create second portion on HEP initiated at last visit with a focus today on hamstring stretching and balance; however, patient presented today after a very poor night of sleep (mother in law fell, wife had to leave in the night to care for her, etc.) and he was extremely fatigued stating this was why he was late to the appointment. Patient's fatigue was very limiting to therapeutic interventions, and PT was unable to progress balance exercise enough to assess his tolerance and safety today with enough confidence  to add to HEP at this time for standing balance. PT required to walk with patient to car due to today's fatigue. He will benefit from continued skilled PT to decrease fall risk.  Personal Factors and Comorbidities Comorbidity 3+    Comorbidities HTN, DM2, renal transplant, CKD st IV, calciphylaxis, A-Fib, bil. TTAs, TIA    Examination-Activity Limitations Locomotion Level;Squat;Stairs;Stand;Transfers    Examination-Participation Restrictions Community Activity    Stability/Clinical Decision Making Evolving/Moderate complexity    Rehab Potential Good    PT Frequency 2x / week    PT Duration 8 weeks    PT Treatment/Interventions ADLs/Self Care Home Management;DME Instruction;Gait training;Stair training;Functional mobility training;Therapeutic activities;Therapeutic exercise;Balance training;Neuromuscular re-education;Patient/family education;Prosthetic Training;Manual techniques;Vestibular    PT Next Visit Plan add sit<>stand and balance to HEP (unable to at visit #3 due to fatigue - poor night's sleep), prosthetic gait training including ramp, curb,stairs    Consulted and Agree with Plan of Care Patient           Patient will benefit from skilled therapeutic intervention in order to improve the following deficits and impairments:  Abnormal gait, Cardiopulmonary status limiting activity, Decreased activity tolerance, Decreased balance, Decreased endurance, Decreased mobility, Decreased range of motion, Decreased strength, Impaired flexibility, Postural dysfunction, Prosthetic Dependency  Visit Diagnosis: Other abnormalities of gait and mobility  Unsteadiness on feet  Weakness generalized  History of fall  Stiffness of left hip, not elsewhere classified  Stiffness of right hip, not elsewhere classified  Muscle weakness (generalized)     Problem List Patient Active Problem List   Diagnosis Date Noted  . CKD stage 4 due to type 2 diabetes mellitus ([place]) 04/29/2019  . AKI (acute  kidney injury) ([place]) 04/24/2019  . Hyperglycemia 04/24/2019  . Nausea and vomiting 04/24/2019  . Fever 04/23/2019  . Acute metabolic encephalopathy 09/98/3382  . Stroke ([place]) 10/28/2018  . Middle cerebral artery embolism, left 10/28/2018  . Metabolic acidosis 50/53/9767  . Dehydration 05/24/2018  . Pressure injury of skin 05/24/2018  . Dyspnea 05/23/2018  . Acute respiratory failure with hypoxia ([place]) 05/23/2018  . Abnormal cardiovascular stress test 12/29/2013  . Awaiting organ transplant 12/09/2013  . Leg ulcer ([place]) 11/17/2013  . Chronic kidney disease (CKD), stage V ([place]) 11/17/2013  . Diabetes ([place]) 11/17/2013  . Cancer of duodenum ([place]) 11/17/2013  . Dyslipidemia 11/17/2013  . Chronic kidney disease requiring chronic dialysis ([place]) 11/17/2013  . Hypercholesteremia 11/17/2013  . H/O malignant neoplasm 11/17/2013  . H/O: HTN (hypertension) 11/17/2013  . BP (high blood pressure) 11/17/2013  . Hypotension 11/17/2013  . Angiopathy, peripheral ([place]) 11/17/2013  . AF (paroxysmal atrial fibrillation) ([place]) 11/17/2013  . BKA stump complication ([place]) 34/19/3790  . S/P bilateral BKA (below knee amputation) ([place]) 08/03/2013  . S/P BKA (below knee amputation) bilateral ([place]) 07/30/2013  . Open wound of both legs with complication 24/01/7352  . Acute blood loss anemia 07/12/2013  . Syncope 07/09/2013  . Intolerance to CAPD peritoneal dialysis s/p CAPD cath removal 07/09/2013 07/05/2013  . Critical lower limb ischemia 06/30/2013  . Extremity pain 06/30/2013  . Atherosclerosis of native arteries of the extremities with ulceration(440.23) 06/17/2013  . Gait disorder 05/31/2013  . Weakness 05/24/2013  . Depression 04/03/2013  . GERD (gastroesophageal reflux disease) 04/03/2013  . Atrial fibrillation ([place]) 04/02/2013  . DM (diabetes mellitus) ([place]) 12/28/2012  . Other complications due to renal dialysis device, implant, and graft 05/19/2012  . Diabetes mellitus, type 2 ([place]) 11/13/2011    . Anemia 07/02/2011  . ESRD on hemodialysis [facility]) 05/29/2011    [place], PT, DPT  10/29/2019, 3:41 PM  [facility] [address] [place], [place], [address] Phone: [phone]  Fax:  [phone]  Name: Paul [name] MRN: [id number] Date of Birth: 03/09/1956   

## 2019-10-29 NOTE — Patient Instructions (Signed)
Access Code: [email or web address] URL: [email or web address] Date: 10/29/2019 Prepared by: Rosanne Ashing   Exercises Standing Marching - 1-2 x daily - 7 x weekly - 1 sets - 10 reps Standing Hip Abduction with Counter Support - 1-2 x daily - 7 x weekly - 1 sets - 10 reps Standing Hip Extension with Counter Support - 1-2 x daily - 7 x weekly - 1 sets - 10 reps Seated Hamstring Stretch (BKA) - 2 x daily - 7 x weekly - 1 sets - 3 reps - 20 second hold

## 2019-11-02 ENCOUNTER — Other Ambulatory Visit: Payer: Self-pay

## 2019-11-02 ENCOUNTER — Ambulatory Visit (HOSPITAL_COMMUNITY)
Admission: RE | Admit: 2019-11-02 | Discharge: 2019-11-02 | Disposition: A | Discharge status: Home / Self Care | Payer: Managed Care, Other (non HMO) | Source: Ambulatory Visit | Attending: Nephrology | Admitting: Nephrology

## 2019-11-02 VITALS — BP 144/56 | HR 68 | Temp 97.2°F

## 2019-11-02 DIAGNOSIS — D638 Anemia in other chronic diseases classified elsewhere: Secondary | ICD-10-CM | POA: Diagnosis present

## 2019-11-02 LAB — POCT HEMOGLOBIN-HEMACUE: Hemoglobin: 11.3 g/dL — ABNORMAL LOW (ref 13.0–17.0)

## 2019-11-02 LAB — IRON AND TIBC
Iron: 94 ug/dL (ref 45–182)
Saturation Ratios: 52 % — ABNORMAL HIGH (ref 17.9–39.5)
TIBC: 182 ug/dL — ABNORMAL LOW (ref 250–450)
UIBC: 88 ug/dL

## 2019-11-02 LAB — FERRITIN: Ferritin: 313 ng/mL (ref 24–336)

## 2019-11-02 MED ORDER — DARBEPOETIN ALFA 200 MCG/0.4ML IJ SOSY
PREFILLED_SYRINGE | INTRAMUSCULAR | Status: AC
  Administered 2019-11-02: 200 ug via SUBCUTANEOUS
  Filled 2019-11-02: qty 0.4

## 2019-11-02 MED ORDER — DARBEPOETIN ALFA 25 MCG/0.42ML IJ SOSY
PREFILLED_SYRINGE | INTRAMUSCULAR | Status: AC
  Administered 2019-11-02: 25 ug via SUBCUTANEOUS
  Filled 2019-11-02: qty 0.42

## 2019-11-02 MED ORDER — DARBEPOETIN ALFA 300 MCG/0.6ML IJ SOSY: 225.0000 ug | PREFILLED_SYRINGE | INTRAMUSCULAR | Status: DC

## 2019-11-03 ENCOUNTER — Ambulatory Visit: Payer: Managed Care, Other (non HMO) | Admitting: Physical Therapy

## 2019-11-12 ENCOUNTER — Other Ambulatory Visit: Payer: Self-pay

## 2019-11-12 ENCOUNTER — Ambulatory Visit: Payer: Managed Care, Other (non HMO) | Admitting: Physical Therapy

## 2019-11-12 ENCOUNTER — Encounter: Payer: Self-pay | Admitting: Physical Therapy

## 2019-11-12 DIAGNOSIS — R293 Abnormal posture: Secondary | ICD-10-CM

## 2019-11-12 DIAGNOSIS — R531 Weakness: Secondary | ICD-10-CM

## 2019-11-12 DIAGNOSIS — R2689 Other abnormalities of gait and mobility: Secondary | ICD-10-CM | POA: Diagnosis not present

## 2019-11-12 DIAGNOSIS — R2681 Unsteadiness on feet: Secondary | ICD-10-CM

## 2019-11-12 DIAGNOSIS — M6281 Muscle weakness (generalized): Secondary | ICD-10-CM

## 2019-11-12 NOTE — Patient Instructions (Addendum)
Access Code: [email or web address] URL: [email or web address] Date: 11/12/2019 Prepared by: Willow Ora  Exercises Standing Marching - 1-2 x daily - 7 x weekly - 1 sets - 10 reps Standing Hip Abduction with Counter Support - 1-2 x daily - 7 x weekly - 1 sets - 10 reps Standing Hip Extension with Counter Support - 1-2 x daily - 7 x weekly - 1 sets - 10 reps   Seated Hamstring Stretch - 1 x daily - 5 x weekly - 1 sets - 3 reps - 30 hold Sit to Stand with Counter Support - 1 x daily - 5 x weekly - 1 sets - 10 reps Side Stepping with Counter Support - 1 x daily - 5 x weekly - 1 sets - 3 reps Wide Stance with Head Nods and Counter Support - 1 x daily - 5 x weekly - 1 sets - 10 reps Wide Stance with Counter Support - 1 x daily - 5 x weekly - 1 sets - 3 reps - 30 hold Standing Tandem Balance with Counter Support - 1 x daily - 5 x weekly - 1 sets - 3 reps - 30 hold Standing Forward Step Taps with Counter Support - 1 x daily - 5 x weekly - 1 sets - 3 reps - 15 hold

## 2019-11-12 NOTE — Therapy (Signed)
[facility] [address] [place], [place], [address] Phone: [phone]   Fax:  [phone]  Physical Therapy Treatment  Patient Details  Name: Paul [name] MRN: [id number] Date of Birth: [birth year] Referring Provider (PT): [name], DO   Encounter Date: 11/12/2019   PT End of Session - 11/12/19 1239    Visit Number 4    Number of Visits 25    Date for PT Re-Evaluation 01/04/20    Authorization Type [employer] Managed Medicare A & B    PT Start Time 1232    PT Stop Time 1317    PT Time Calculation (min) 45 min    Equipment Utilized During Treatment Gait belt    Activity Tolerance Patient tolerated treatment well;Patient limited by fatigue   limited by light-headedness   Behavior During Therapy [facility] for tasks assessed/performed           Past Medical History:  Diagnosis Date  . A-fib ([place])   . Anemia    esrd  . Calciphylaxis 05/2013   lower ext  . Cancer ([place])    hx duodenal adenoCa 2002, resected  . Closed left arm fracture 1967  . Corneal abrasion, left   . Depression   . Diabetes mellitus    Type 1 iddm x 11 yrs  . Diabetic neuropathy ([place])   . ESRD on hemodialysis ([place])    Pt has ESRD due to DM.  He had a L upper arm AVF prior to starting HD which was ligated due to L arm steal syndrome.  He started HD in Jan 2014 and did home HD.  The family couldn't cannulate the R arm AVF successfully so by mid 2014 they decided to switch to PD which was done late summer 2014.  In Jan 2015 he was admitted with FTT felt to be due to underdialysis on PD and PD was abandoned and he   . GERD (gastroesophageal reflux disease)   . Glomerulosclerosis, diabetic ([place])   . Heart murmur   . History of blood transfusion   . Hypertension    off of meds due to orthostatic hypotension  . Kidney transplant recipient   . Open wound of both legs with complication    Pt has had progressive wounds of both LE's including gangrene of  the toes and patchy necrosis of the calves.  He has been treated at [facility] by Dr Jerline Pain with hyperbaric O2 5d per week.  He is getting Na Thiosulfate with HD for suspected calciphylaxis. Pt says doctor's aren't sure if these ulcers were diabetic ulcers or calciphylaxis.  He underwent bilat BKA on 07/30/13.  He says that the woun    Past Surgical History:  Procedure Laterality Date  . AMPUTATION Bilateral 07/30/2013   Procedure: AMPUTATION BELOW KNEE;  Surgeon: Newt Minion, MD;  Location: [facility];  Service: Orthopedics;  Laterality: Bilateral;  Bilateral Below Knee Amputations  . AMPUTATION Bilateral 09/03/2013   Procedure: AMPUTATION BELOW KNEE;  Surgeon: Newt Minion, MD;  Location: [facility];  Service: Orthopedics;  Laterality: Bilateral;  Bilateral Below Knee Amputation Revision  . AV FISTULA PLACEMENT  06/07/2011   Procedure: ARTERIOVENOUS (AV) FISTULA CREATION;  Surgeon: Angelia Mould, MD;  Location: [facility];  Service: Vascular;  Laterality: Left;  . AV FISTULA PLACEMENT  06/18/2012   Procedure: ARTERIOVENOUS (AV) FISTULA CREATION;  Surgeon: Mal Misty, MD;  Location: [facility];  Service: Vascular;  Laterality: Right;  [place]  . BILIARY  DIVERSION, EXTERNAL  2002  . BONE MARROW BIOPSY  2012  . CAPD INSERTION N/A 01/14/2013   Procedure: LAPAROSCOPIC INSERTION CONTINUOUS AMBULATORY PERITONEAL DIALYSIS  (CAPD) CATHETER;  Surgeon: Adin Hector, MD;  Location: [facility];  Service: General;  Laterality: N/A;  . CAPD REMOVAL N/A 07/09/2013   Procedure: CONTINUOUS AMBULATORY PERITONEAL DIALYSIS  (CAPD) CATHETER REMOVAL;  Surgeon: Adin Hector, MD;  Location: [facility];  Service: General;  Laterality: N/A;  . COLONOSCOPY     Hx: of  . INSERTION OF DIALYSIS CATHETER  05/22/2012   Procedure: INSERTION OF DIALYSIS CATHETER;  Surgeon: Mal Misty, MD;  Location: [facility];  Service: Vascular;  Laterality: N/A;  right internal jugular vein  . IR ANGIO INTRA EXTRACRAN SEL COM CAROTID INNOMINATE BILAT MOD  SED  10/28/2018  . IR ANGIO VERTEBRAL SEL SUBCLAVIAN INNOMINATE UNI R MOD SED  10/28/2018  . IR ANGIO VERTEBRAL SEL VERTEBRAL UNI L MOD SED  10/28/2018  . LIGATION OF ARTERIOVENOUS  FISTULA  05/22/2012   Procedure: LIGATION OF ARTERIOVENOUS  FISTULA;  Surgeon: Mal Misty, MD;  Location: [facility];  Service: Vascular;  Laterality: Left;  Left brachio-cephalic fistula  . LOWER EXTREMITY ANGIOGRAM N/A 06/14/2013   Procedure: LOWER EXTREMITY ANGIOGRAM;  Surgeon: Angelia Mould, MD;  Location: [facility] CATH LAB;  Service: Cardiovascular;  Laterality: N/A;  . NEPHRECTOMY TRANSPLANTED ORGAN    . OTHER SURGICAL HISTORY     Cyst removed from back  . PORTACATH PLACEMENT  2002  . RADIOLOGY WITH ANESTHESIA N/A 10/28/2018   Procedure: IR WITH ANESTHESIA;  Surgeon: Radiologist, Medication, MD;  Location: [facility];  Service: Radiology;  Laterality: N/A;  . REMOVAL OF A DIALYSIS CATHETER    . RENAL BIOPSY, PERCUTANEOUS  2012  . TONSILLECTOMY    . VASCULAR SURGERY    . WHIPPLE PROCEDURE  2002   duodenal ca, Dr. Harlow Asa    There were no vitals filed for this visit.   Subjective Assessment - 11/12/19 1235    Subjective [place]. Feels it was due to left prosthesis not being fully engaged. Denies any injuries. Son helped him back up. No pain currently. Using cane today. Planning to enroll in [employer] now that he is back from beach.    Pertinent History HTN, DM2, renal transplant, CKD st IV, calciphylaxis, A-Fib, bil. TTAs, TIA    Patient Stated Goals He would like to get off walker completely. Get close to prior level with prostheses which means no device in home & cane in community.    Currently in Pain? No/denies                [facility] Adult PT Treatment/Exercise - 11/12/19 1258      Transfers   Transfers Sit to Stand;Stand to Sit    Sit to Stand 5: Supervision;With upper extremity assist;With armrests;From chair/3-in-1    Stand to Sit 5: Supervision;With upper extremity assist;With  armrests;To chair/3-in-1      Ambulation/Gait   Ambulation/Gait Yes    Ambulation/Gait Assistance 5: Supervision    Ambulation/Gait Assistance Details around gym with session    Assistive device Straight cane;Prostheses    Gait Pattern Step-through pattern;Decreased arm swing - left;Decreased stride length;Left flexed knee in stance;Antalgic;Lateral hip instability;Trunk flexed;Wide base of support    Ambulation Surface Level;Indoor      Neuro Re-ed    Neuro Re-ed Details  added balance ex's to pt's HEP. min guard assist for balance. cues on  ex form and technique.            Issued the following to HEP:  Access Code: [email or web address] URL: [email or web address] Date: 11/12/2019 Prepared by: Willow Ora  Exercises Standing Marching - 1-2 x daily - 7 x weekly - 1 sets - 10 reps Standing Hip Abduction with Counter Support - 1-2 x daily - 7 x weekly - 1 sets - 10 reps Standing Hip Extension with Counter Support - 1-2 x daily - 7 x weekly - 1 sets - 10 reps  Added the following to HEP today: Seated Hamstring Stretch - 1 x daily - 5 x weekly - 1 sets - 3 reps - 30 hold Sit to Stand with Counter Support - 1 x daily - 5 x weekly - 1 sets - 10 reps Side Stepping with Counter Support - 1 x daily - 5 x weekly - 1 sets - 3 reps Wide Stance with Head Nods and Counter Support - 1 x daily - 5 x weekly - 1 sets - 10 reps Wide Stance with Counter Support - 1 x daily - 5 x weekly - 1 sets - 3 reps - 30 hold Standing Tandem Balance with Counter Support - 1 x daily - 5 x weekly - 1 sets - 3 reps - 30 hold Standing Forward Step Taps with Counter Support - 1 x daily - 5 x weekly - 1 sets - 3 reps - 15 hold         PT Education - 11/12/19 1315    Education Details added balance ex's to HEP    Person(s) Educated Patient    Methods Explanation;Demonstration;Tactile cues;Verbal cues;Handout    Comprehension Verbalized understanding;Verbal cues required;Returned demonstration;Tactile cues  required            PT Short Term Goals - 10/06/19 1438      PT SHORT TERM GOAL #1   Title Patient demonstrates & verbalizes understanding of initial HEP. (All STGs Target Date 11/19/2019)    Time 4   Pt going to his beach house twice so STGs set for 4 weeks of care   Status New    Target Date 11/19/19      PT SHORT TERM GOAL #2   Title Berg Balance >/= 36/56    Time 4    Period Weeks    Status New    Target Date 11/19/19      PT SHORT TERM GOAL #3   Title Dynamic Gait Index with cane >/= 12/24 with supervision    Time 4    Period Weeks    Status New    Target Date 11/19/19      PT SHORT TERM GOAL #4   Title Patient ambulates 200' with cane & prostheses with supervision.    Time 4    Period Weeks    Status New    Target Date 11/19/19      PT SHORT TERM GOAL #5   Title Patient negotiates ramps, curbs & stairs single rail with cane & prostheses with minA.    Time 4    Period Weeks    Status New    Target Date 11/19/19             PT Long Term Goals - 10/06/19 1438      PT LONG TERM GOAL #1   Title Patient verbalizes & demonstrates understanding of ongoing fitness plan including home exercises & fitness center exercise program.  (All LTGs Target Date: 12/17/2019)  Time 8    Period Weeks    Status New    Target Date 12/17/19      PT LONG TERM GOAL #2   Title Berg >/= 45/56    Time 8    Period Weeks    Status New    Target Date 12/17/19      PT LONG TERM GOAL #3   Title Dynamic Gait Index with cane >/= 19/24 to indicate lower fall risk.    Time 8    Period Weeks    Status New    Target Date 12/17/19      PT LONG TERM GOAL #4   Title Patient ambulates >400' with cane & prostheses modified independent.    Time 8    Period Weeks    Status New    Target Date 12/17/19      PT LONG TERM GOAL #5   Title Patient negotiates ramps, curbs & stairs with single rail with cane & prostheses modified independent.    Time 8    Period Weeks    Status New     Target Date 12/17/19      PT LONG TERM GOAL #6   Title Patient ambulates 54' with prostheses only around furniture modified independent.    Time 8    Period Weeks    Status New    Target Date 12/17/19                 Plan - 11/12/19 1238    Clinical Impression Statement Today's skilled session focused on adding balance ex's to pt's HEP. Rest breaks needed due to fatigue. No other issues were reported or noted in session. The pt is progressing toward goals and should benefit from continued PT to progress toward unmet goals.    Personal Factors and Comorbidities Comorbidity 3+    Comorbidities HTN, DM2, renal transplant, CKD st IV, calciphylaxis, A-Fib, bil. TTAs, TIA    Examination-Activity Limitations Locomotion Level;Squat;Stairs;Stand;Transfers    Examination-Participation Restrictions Community Activity    Stability/Clinical Decision Making Evolving/Moderate complexity    Rehab Potential Good    PT Frequency 2x / week    PT Duration 8 weeks    PT Treatment/Interventions ADLs/Self Care Home Management;DME Instruction;Gait training;Stair training;Functional mobility training;Therapeutic activities;Therapeutic exercise;Balance training;Neuromuscular re-education;Patient/family education;Prosthetic Training;Manual techniques;Vestibular    PT Next Visit Plan prosthetic gait training including ramp, curb,stairs, dynamic balance/gait, Scifit for activity tolerance    Consulted and Agree with Plan of Care Patient           Patient will benefit from skilled therapeutic intervention in order to improve the following deficits and impairments:  Abnormal gait, Cardiopulmonary status limiting activity, Decreased activity tolerance, Decreased balance, Decreased endurance, Decreased mobility, Decreased range of motion, Decreased strength, Impaired flexibility, Postural dysfunction, Prosthetic Dependency  Visit Diagnosis: Other abnormalities of gait and mobility  Unsteadiness on  feet  Weakness generalized  Muscle weakness (generalized)  Abnormal posture     Problem List Patient Active Problem List   Diagnosis Date Noted  . CKD stage 4 due to type 2 diabetes mellitus ([place]) 04/29/2019  . AKI (acute kidney injury) ([place]) 04/24/2019  . Hyperglycemia 04/24/2019  . Nausea and vomiting 04/24/2019  . Fever 04/23/2019  . Acute metabolic encephalopathy 21/19/4174  . Stroke ([place]) 10/28/2018  . Middle cerebral artery embolism, left 10/28/2018  . Metabolic acidosis 12/31/4816  . Dehydration 05/24/2018  . Pressure injury of skin 05/24/2018  . Dyspnea 05/23/2018  . Acute respiratory failure with hypoxia (  [place]) 05/23/2018  . Abnormal cardiovascular stress test 12/29/2013  . Awaiting organ transplant 12/09/2013  . Leg ulcer ([place]) 11/17/2013  . Chronic kidney disease (CKD), stage V ([place]) 11/17/2013  . Diabetes ([place]) 11/17/2013  . Cancer of duodenum ([place]) 11/17/2013  . Dyslipidemia 11/17/2013  . Chronic kidney disease requiring chronic dialysis ([place]) 11/17/2013  . Hypercholesteremia 11/17/2013  . H/O malignant neoplasm 11/17/2013  . H/O: HTN (hypertension) 11/17/2013  . BP (high blood pressure) 11/17/2013  . Hypotension 11/17/2013  . Angiopathy, peripheral ([place]) 11/17/2013  . AF (paroxysmal atrial fibrillation) ([place]) 11/17/2013  . BKA stump complication ([place]) 03/19/5944  . S/P bilateral BKA (below knee amputation) ([place]) 08/03/2013  . S/P BKA (below knee amputation) bilateral ([place]) 07/30/2013  . Open wound of both legs with complication 85/92/9244  . Acute blood loss anemia 07/12/2013  . Syncope 07/09/2013  . Intolerance to CAPD peritoneal dialysis s/p CAPD cath removal 07/09/2013 07/05/2013  . Critical lower limb ischemia 06/30/2013  . Extremity pain 06/30/2013  . Atherosclerosis of native arteries of the extremities with ulceration(440.23) 06/17/2013  . Gait disorder 05/31/2013  . Weakness 05/24/2013  . Depression 04/03/2013  . GERD (gastroesophageal reflux  disease) 04/03/2013  . Atrial fibrillation ([place]) 04/02/2013  . DM (diabetes mellitus) ([place]) 12/28/2012  . Other complications due to renal dialysis device, implant, and graft 05/19/2012  . Diabetes mellitus, type 2 ([place]) 11/13/2011  . Anemia 07/02/2011  . ESRD on hemodialysis [facility]) 05/29/2011    Willow Ora, PTA, [facility] [address] [place], [place] [address] [phone] 11/12/19, 4:27 PM   Name: Paul [name] MRN: [id number] Date of Birth: 03-19-1956

## 2019-11-15 ENCOUNTER — Encounter: Payer: Self-pay | Admitting: Rehabilitation

## 2019-11-15 ENCOUNTER — Ambulatory Visit: Payer: Managed Care, Other (non HMO) | Admitting: Rehabilitation

## 2019-11-15 ENCOUNTER — Other Ambulatory Visit: Payer: Self-pay

## 2019-11-15 DIAGNOSIS — M6281 Muscle weakness (generalized): Secondary | ICD-10-CM

## 2019-11-15 DIAGNOSIS — R2689 Other abnormalities of gait and mobility: Secondary | ICD-10-CM | POA: Diagnosis not present

## 2019-11-15 DIAGNOSIS — R531 Weakness: Secondary | ICD-10-CM

## 2019-11-15 DIAGNOSIS — R2681 Unsteadiness on feet: Secondary | ICD-10-CM

## 2019-11-15 DIAGNOSIS — R293 Abnormal posture: Secondary | ICD-10-CM

## 2019-11-15 NOTE — Therapy (Signed)
[facility] [address] [place], [place], [address] Phone: [phone]   Fax:  [phone]  Physical Therapy Treatment  Patient Details  Name: Paul [name] MRN: [id number] Date of Birth: 09/07/1955 Referring Provider (PT): [name], DO   Encounter Date: 11/15/2019   PT End of Session - 11/15/19 1244    Visit Number 5    Number of Visits 25    Date for PT Re-Evaluation 01/04/20    Authorization Type [employer]aged [employer] A & B    PT Start Time 1153   PT late from previous session   PT Stop Time 1230    PT Time Calculation (min) 37 min    Equipment Utilized During Treatment Gait belt    Activity Tolerance Patient tolerated treatment well;Patient limited by fatigue   limited by light-headedness   Behavior During Therapy [facility] for tasks assessed/performed           Past Medical History:  Diagnosis Date  . A-fib ([place])   . Anemia    esrd  . Calciphylaxis 05/2013   lower ext  . Cancer ([place])    hx duodenal adenoCa 2002, resected  . Closed left arm fracture 1967  . Corneal abrasion, left   . Depression   . Diabetes mellitus    Type 1 iddm x 11 yrs  . Diabetic neuropathy ([place])   . ESRD on hemodialysis ([place])    Pt has ESRD due to DM.  He had a L upper arm AVF prior to starting HD which was ligated due to L arm steal syndrome.  He started HD in Jan 2014 and did home HD.  The family couldn't cannulate the R arm AVF successfully so by mid 2014 they decided to switch to PD which was done late summer 2014.  In Jan 2015 he was admitted with FTT felt to be due to underdialysis on PD and PD was abandoned and he   . GERD (gastroesophageal reflux disease)   . Glomerulosclerosis, diabetic ([place])   . Heart murmur   . History of blood transfusion   . Hypertension    off of meds due to orthostatic hypotension  . Kidney transplant recipient   . Open wound of both legs with complication    Pt has had progressive wounds of  both LE's including gangrene of the toes and patchy necrosis of the calves.  He has been treated at [facility] by Dr Jerline Pain with hyperbaric O2 5d per week.  He is getting Na Thiosulfate with HD for suspected calciphylaxis. Pt says doctor's aren't sure if these ulcers were diabetic ulcers or calciphylaxis.  He underwent bilat BKA on 07/30/13.  He says that the woun    Past Surgical History:  Procedure Laterality Date  . AMPUTATION Bilateral 07/30/2013   Procedure: AMPUTATION BELOW KNEE;  Surgeon: Newt Minion, MD;  Location: [facility];  Service: Orthopedics;  Laterality: Bilateral;  Bilateral Below Knee Amputations  . AMPUTATION Bilateral 09/03/2013   Procedure: AMPUTATION BELOW KNEE;  Surgeon: Newt Minion, MD;  Location: [facility];  Service: Orthopedics;  Laterality: Bilateral;  Bilateral Below Knee Amputation Revision  . AV FISTULA PLACEMENT  06/07/2011   Procedure: ARTERIOVENOUS (AV) FISTULA CREATION;  Surgeon: Angelia Mould, MD;  Location: [facility];  Service: Vascular;  Laterality: Left;  . AV FISTULA PLACEMENT  06/18/2012   Procedure: ARTERIOVENOUS (AV) FISTULA CREATION;  Surgeon: Mal Misty, MD;  Location: [facility];  Service: Vascular;  Laterality:  Right;  CIMINO  . BILIARY DIVERSION, EXTERNAL  2002  . BONE MARROW BIOPSY  2012  . CAPD INSERTION N/A 01/14/2013   Procedure: LAPAROSCOPIC INSERTION CONTINUOUS AMBULATORY PERITONEAL DIALYSIS  (CAPD) CATHETER;  Surgeon: Adin Hector, MD;  Location: [facility];  Service: General;  Laterality: N/A;  . CAPD REMOVAL N/A 07/09/2013   Procedure: CONTINUOUS AMBULATORY PERITONEAL DIALYSIS  (CAPD) CATHETER REMOVAL;  Surgeon: Adin Hector, MD;  Location: [facility];  Service: General;  Laterality: N/A;  . COLONOSCOPY     Hx: of  . INSERTION OF DIALYSIS CATHETER  05/22/2012   Procedure: INSERTION OF DIALYSIS CATHETER;  Surgeon: Mal Misty, MD;  Location: [facility];  Service: Vascular;  Laterality: N/A;  right internal jugular vein  . IR ANGIO INTRA EXTRACRAN SEL COM  CAROTID INNOMINATE BILAT MOD SED  10/28/2018  . IR ANGIO VERTEBRAL SEL SUBCLAVIAN INNOMINATE UNI R MOD SED  10/28/2018  . IR ANGIO VERTEBRAL SEL VERTEBRAL UNI L MOD SED  10/28/2018  . LIGATION OF ARTERIOVENOUS  FISTULA  05/22/2012   Procedure: LIGATION OF ARTERIOVENOUS  FISTULA;  Surgeon: Mal Misty, MD;  Location: [facility];  Service: Vascular;  Laterality: Left;  Left brachio-cephalic fistula  . LOWER EXTREMITY ANGIOGRAM N/A 06/14/2013   Procedure: LOWER EXTREMITY ANGIOGRAM;  Surgeon: Angelia Mould, MD;  Location: [facility] CATH LAB;  Service: Cardiovascular;  Laterality: N/A;  . NEPHRECTOMY TRANSPLANTED ORGAN    . OTHER SURGICAL HISTORY     Cyst removed from back  . PORTACATH PLACEMENT  2002  . RADIOLOGY WITH ANESTHESIA N/A 10/28/2018   Procedure: IR WITH ANESTHESIA;  Surgeon: Radiologist, Medication, MD;  Location: [facility];  Service: Radiology;  Laterality: N/A;  . REMOVAL OF A DIALYSIS CATHETER    . RENAL BIOPSY, PERCUTANEOUS  2012  . TONSILLECTOMY    . VASCULAR SURGERY    . WHIPPLE PROCEDURE  2002   duodenal ca, Dr. Harlow Asa    There were no vitals filed for this visit.   Subjective Assessment - 11/15/19 1202    Subjective No changes since last visit.    Pertinent History HTN, DM2, renal transplant, CKD st IV, calciphylaxis, A-Fib, bil. TTAs, TIA    Patient Stated Goals He would like to get off walker completely. Get close to prior level with prostheses which means no device in home & cane in community.    Currently in Pain? No/denies                             [facility] Adult PT Treatment/Exercise - 11/15/19 1211      Transfers   Transfers Sit to Stand;Stand to Sit    Sit to Stand 4: Min guard;4: Min assist    Comments sit<>stand from lower bar stool for quad/glute strenthenig x 10 reps in side // bars for support as needed.   Cues for less UE suppor to "pull" into stand.  Pt progressed with activity.  Cues for forward trunk lean and "bowing" when sitting., Pt needing  seated rest breaks following every 2 reps due to fatigue.        Ambulation/Gait   Ambulation/Gait Yes    Ambulation/Gait Assistance 5: Supervision    Ambulation/Gait Assistance Details S with cane throughout session from task to task.  Min cues for upright posture.      Ambulation Distance (Feet) 50 Feet   x 2 reps   Assistive device Straight cane;Prostheses  Gait Pattern Step-through pattern;Decreased arm swing - left;Decreased stride length;Left flexed knee in stance;Antalgic;Lateral hip instability;Trunk flexed;Wide base of support    Ambulation Surface Level;Indoor      Neuro Re-ed    Neuro Re-ed Details  Balance/strength in // bars stepping forward over black balance beams each space is a step x 4 reps folllowed by backwards stepping (step to) x 4 reps with single (some BUE) support to simulate cane.  Pt needing increased seated rest breaks due to increased fatigue today.       Knee/Hip Exercises: Machines for Strengthening   Cybex Leg Press 60 lbs BLE x 15 reps                     PT Short Term Goals - 10/06/19 1438      PT SHORT TERM GOAL #1   Title Patient demonstrates & verbalizes understanding of initial HEP. (All STGs Target Date 11/19/2019)    Time 4   Pt [place] his [place] twice so STGs set for 4 weeks of care   Status New    Target Date 11/19/19      PT SHORT TERM GOAL #2   Title Berg Balance >/= 36/56    Time 4    Period Weeks    Status New    Target Date 11/19/19      PT SHORT TERM GOAL #3   Title Dynamic Gait Index with cane >/= 12/24 with supervision    Time 4    Period Weeks    Status New    Target Date 11/19/19      PT SHORT TERM GOAL #4   Title Patient ambulates 200' with cane & prostheses with supervision.    Time 4    Period Weeks    Status New    Target Date 11/19/19      PT SHORT TERM GOAL #5   Title Patient negotiates ramps, curbs & stairs single rail with cane & prostheses with minA.    Time 4    Period Weeks    Status  New    Target Date 11/19/19             PT Long Term Goals - 10/06/19 1438      PT LONG TERM GOAL #1   Title Patient verbalizes & demonstrates understanding of ongoing fitness plan including home exercises & fitness center exercise program.  (All LTGs Target Date: 12/17/2019)    Time 8    Period Weeks    Status New    Target Date 12/17/19      PT LONG TERM GOAL #2   Title Berg >/= 45/56    Time 8    Period Weeks    Status New    Target Date 12/17/19      PT LONG TERM GOAL #3   Title Dynamic Gait Index with cane >/= 19/24 to indicate lower fall risk.    Time 8    Period Weeks    Status New    Target Date 12/17/19      PT LONG TERM GOAL #4   Title Patient ambulates >400' with cane & prostheses modified independent.    Time 8    Period Weeks    Status New    Target Date 12/17/19      PT LONG TERM GOAL #5   Title Patient negotiates ramps, curbs & stairs with single rail with cane & prostheses modified independent.  Time 8    Period Weeks    Status New    Target Date 12/17/19      PT LONG TERM GOAL #6   Title Patient ambulates 23' with prostheses only around furniture modified independent.    Time 8    Period Weeks    Status New    Target Date 12/17/19                 Plan - 11/15/19 1244    Clinical Impression Statement Skilled session focused on high level balance, BLE strengthening and endurance.  Pt continues to be severely limited by poor endurance and needs several seated breaks even within a task.    Personal Factors and Comorbidities Comorbidity 3+    Comorbidities HTN, DM2, renal transplant, CKD st IV, calciphylaxis, A-Fib, bil. TTAs, TIA    Examination-Activity Limitations Locomotion Level;Squat;Stairs;Stand;Transfers    Examination-Participation Restrictions Community Activity    Stability/Clinical Decision Making Evolving/Moderate complexity    Rehab Potential Good    PT Frequency 2x / week    PT Duration 8 weeks    PT  Treatment/Interventions ADLs/Self Care Home Management;DME Instruction;Gait training;Stair training;Functional mobility training;Therapeutic activities;Therapeutic exercise;Balance training;Neuromuscular re-education;Patient/family education;Prosthetic Training;Manual techniques;Vestibular    PT Next Visit Plan check STGs, prosthetic gait training including ramp, curb,stairs, dynamic balance/gait, Scifit for activity tolerance    Consulted and Agree with Plan of Care Patient           Patient will benefit from skilled therapeutic intervention in order to improve the following deficits and impairments:  Abnormal gait, Cardiopulmonary status limiting activity, Decreased activity tolerance, Decreased balance, Decreased endurance, Decreased mobility, Decreased range of motion, Decreased strength, Impaired flexibility, Postural dysfunction, Prosthetic Dependency  Visit Diagnosis: Other abnormalities of gait and mobility  Unsteadiness on feet  Weakness generalized  Muscle weakness (generalized)  Abnormal posture     Problem List Patient Active Problem List   Diagnosis Date Noted  . CKD stage 4 due to type 2 diabetes mellitus ([place]) 04/29/2019  . AKI (acute kidney injury) ([place]) 04/24/2019  . Hyperglycemia 04/24/2019  . Nausea and vomiting 04/24/2019  . Fever 04/23/2019  . Acute metabolic encephalopathy 14/97/0263  . Stroke ([place]) 10/28/2018  . Middle cerebral artery embolism, left 10/28/2018  . Metabolic acidosis 78/58/8502  . Dehydration 05/24/2018  . Pressure injury of skin 05/24/2018  . Dyspnea 05/23/2018  . Acute respiratory failure with hypoxia ([place]) 05/23/2018  . Abnormal cardiovascular stress test 12/29/2013  . Awaiting organ transplant 12/09/2013  . Leg ulcer ([place]) 11/17/2013  . Chronic kidney disease (CKD), stage V ([place]) 11/17/2013  . Diabetes ([place]) 11/17/2013  . Cancer of duodenum ([place]) 11/17/2013  . Dyslipidemia 11/17/2013  . Chronic kidney disease requiring chronic  dialysis ([place]) 11/17/2013  . Hypercholesteremia 11/17/2013  . H/O malignant neoplasm 11/17/2013  . H/O: HTN (hypertension) 11/17/2013  . BP (high blood pressure) 11/17/2013  . Hypotension 11/17/2013  . Angiopathy, peripheral ([place]) 11/17/2013  . AF (paroxysmal atrial fibrillation) ([place]) 11/17/2013  . BKA stump complication ([place]) 77/41/2878  . S/P bilateral BKA (below knee amputation) ([place]) 08/03/2013  . S/P BKA (below knee amputation) bilateral ([place]) 07/30/2013  . Open wound of both legs with complication 67/67/2094  . Acute blood loss anemia 07/12/2013  . Syncope 07/09/2013  . Intolerance to CAPD peritoneal dialysis s/p CAPD cath removal 07/09/2013 07/05/2013  . Critical lower limb ischemia 06/30/2013  . Extremity pain 06/30/2013  . Atherosclerosis of native arteries of the extremities with ulceration(440.23) 06/17/2013  . Gait  disorder 05/31/2013  . Weakness 05/24/2013  . Depression 04/03/2013  . GERD (gastroesophageal reflux disease) 04/03/2013  . Atrial fibrillation ([place]) 04/02/2013  . DM (diabetes mellitus) ([place]) 12/28/2012  . Other complications due to renal dialysis device, implant, and graft 05/19/2012  . Diabetes mellitus, type 2 ([place]) 11/13/2011  . Anemia 07/02/2011  . ESRD on hemodialysis ([place]) 05/29/2011    Cameron Sprang, PT, MPT [facility] [address] [place], [place], [address] Phone: [phone]   Fax:  [phone] 11/15/19, 12:46 PM  Name: Paul [name] MRN: [id number] Date of Birth: 07-09-1955

## 2019-11-17 ENCOUNTER — Ambulatory Visit: Payer: Managed Care, Other (non HMO) | Admitting: Physical Therapy

## 2019-11-17 ENCOUNTER — Encounter: Payer: Self-pay | Admitting: Physical Therapy

## 2019-11-17 ENCOUNTER — Other Ambulatory Visit: Payer: Self-pay

## 2019-11-17 DIAGNOSIS — R531 Weakness: Secondary | ICD-10-CM

## 2019-11-17 DIAGNOSIS — M25651 Stiffness of right hip, not elsewhere classified: Secondary | ICD-10-CM

## 2019-11-17 DIAGNOSIS — R2681 Unsteadiness on feet: Secondary | ICD-10-CM

## 2019-11-17 DIAGNOSIS — M25652 Stiffness of left hip, not elsewhere classified: Secondary | ICD-10-CM

## 2019-11-17 DIAGNOSIS — R293 Abnormal posture: Secondary | ICD-10-CM

## 2019-11-17 DIAGNOSIS — R2689 Other abnormalities of gait and mobility: Secondary | ICD-10-CM | POA: Diagnosis not present

## 2019-11-17 NOTE — Patient Instructions (Signed)
Access Code: [email or web address] URL: [email or web address] Date: 11/17/2019 Prepared by: [facility]  Exercises Monday, Wednesday, Friday Standing Marching - 1 x daily - 3 x weekly - 1 sets - 10 reps Standing Hip Abduction with Counter Support - 1 x daily - 3 x weekly - 1 sets - 10 reps Standing Hip Extension with Counter Support - 1 x daily - 3 x weekly - 1 sets - 10 reps Side Stepping with Counter Support - 1 x daily - 3 x weekly - 1 sets - 3 reps  Daily while at table with meal Seated Hamstring Stretch - 1-2 x daily - 7 x weekly - 1 sets - 3 reps - 30 hold Sit to Stand with Counter Support - 1 x daily - 7 x weekly - 1 sets - 10 reps   Tuesday, Thursday, Saturday Wide Stance with Counter Support - 1 x daily - 3 x weekly - 1 sets - 3 reps - 30 hold Wide Stance with Head Nods and Counter Support - 1 x daily - 3 x weekly - 1 sets - 10 reps Standing Forward Step Taps with Counter Support - 1 x daily - 3 x weekly - 1 sets - 10 reps - 3 hold

## 2019-11-17 NOTE — Therapy (Signed)
[facility] [address] [place], [place], [address] Phone: [phone]   Fax:  [phone]  Physical Therapy Treatment  Patient Details  Name: Paul [name] MRN: [id number] Date of Birth: 14-Mar-1956 Referring Provider (PT): [name], DO   Encounter Date: 11/17/2019   PT End of Session - 11/17/19 1356    Visit Number 6    Number of Visits 25    Date for PT Re-Evaluation 01/04/20    Authorization Type [employer] Managed Medicare A & B    PT Start Time 1145    PT Stop Time 5852    PT Time Calculation (min) 50 min    Equipment Utilized During Treatment Gait belt    Activity Tolerance Patient tolerated treatment well;Patient limited by fatigue   limited by light-headedness   Behavior During Therapy [facility] for tasks assessed/performed           Past Medical History:  Diagnosis Date   A-fib ([place])    Anemia    esrd   Calciphylaxis 05/2013   lower ext   Cancer ([place])    hx duodenal adenoCa 2002, resected   Closed left arm fracture 1967   Corneal abrasion, left    Depression    Diabetes mellitus    Type 1 iddm x 11 yrs   Diabetic neuropathy ([place])    ESRD on hemodialysis ([place])    Pt has ESRD due to DM.  He had a L upper arm AVF prior to starting HD which was ligated due to L arm steal syndrome.  He started HD in Jan 2014 and did home HD.  The family couldn't cannulate the R arm AVF successfully so by mid 2014 they decided to switch to PD which was done late summer 2014.  In Jan 2015 he was admitted with FTT felt to be due to underdialysis on PD and PD was abandoned and he    GERD (gastroesophageal reflux disease)    Glomerulosclerosis, diabetic ([place])    Heart murmur    History of blood transfusion    Hypertension    off of meds due to orthostatic hypotension   Kidney transplant recipient    Open wound of both legs with complication    Pt has had progressive wounds of both LE's including gangrene of  the toes and patchy necrosis of the calves.  He has been treated at [facility] by Dr Jerline Pain with hyperbaric O2 5d per week.  He is getting Na Thiosulfate with HD for suspected calciphylaxis. Pt says doctor's aren't sure if these ulcers were diabetic ulcers or calciphylaxis.  He underwent bilat BKA on 07/30/13.  He says that the woun    Past Surgical History:  Procedure Laterality Date   AMPUTATION Bilateral 07/30/2013   Procedure: AMPUTATION BELOW KNEE;  Surgeon: Newt Minion, MD;  Location: [facility];  Service: Orthopedics;  Laterality: Bilateral;  Bilateral Below Knee Amputations   AMPUTATION Bilateral 09/03/2013   Procedure: AMPUTATION BELOW KNEE;  Surgeon: Newt Minion, MD;  Location: [facility];  Service: Orthopedics;  Laterality: Bilateral;  Bilateral Below Knee Amputation Revision   AV FISTULA PLACEMENT  06/07/2011   Procedure: ARTERIOVENOUS (AV) FISTULA CREATION;  Surgeon: Angelia Mould, MD;  Location: [place];  Service: Vascular;  Laterality: Left;   AV FISTULA PLACEMENT  06/18/2012   Procedure: ARTERIOVENOUS (AV) FISTULA CREATION;  Surgeon: Mal Misty, MD;  Location: [facility];  Service: Vascular;  Laterality: Right;  [place]  DIVERSION, EXTERNAL  2002   BONE MARROW BIOPSY  2012   CAPD INSERTION N/A 01/14/2013   Procedure: LAPAROSCOPIC INSERTION CONTINUOUS AMBULATORY PERITONEAL DIALYSIS  (CAPD) CATHETER;  Surgeon: Adin Hector, MD;  Location: [facility];  Service: General;  Laterality: N/A;   CAPD REMOVAL N/A 07/09/2013   Procedure: CONTINUOUS AMBULATORY PERITONEAL DIALYSIS  (CAPD) CATHETER REMOVAL;  Surgeon: Adin Hector, MD;  Location: [facility];  Service: General;  Laterality: N/A;   COLONOSCOPY     Hx: of   INSERTION OF DIALYSIS CATHETER  05/22/2012   Procedure: INSERTION OF DIALYSIS CATHETER;  Surgeon: Mal Misty, MD;  Location: [facility];  Service: Vascular;  Laterality: N/A;  right internal jugular vein   IR ANGIO INTRA EXTRACRAN SEL COM CAROTID INNOMINATE BILAT MOD  SED  10/28/2018   IR ANGIO VERTEBRAL SEL SUBCLAVIAN INNOMINATE UNI R MOD SED  10/28/2018   IR ANGIO VERTEBRAL SEL VERTEBRAL UNI L MOD SED  10/28/2018   LIGATION OF ARTERIOVENOUS  FISTULA  05/22/2012   Procedure: LIGATION OF ARTERIOVENOUS  FISTULA;  Surgeon: Mal Misty, MD;  Location: [facility];  Service: Vascular;  Laterality: Left;  Left brachio-cephalic fistula   LOWER EXTREMITY ANGIOGRAM N/A 06/14/2013   Procedure: LOWER EXTREMITY ANGIOGRAM;  Surgeon: Angelia Mould, MD;  Location: [facility] CATH LAB;  Service: Cardiovascular;  Laterality: N/A;   NEPHRECTOMY TRANSPLANTED ORGAN     OTHER SURGICAL HISTORY     Cyst removed from back   [place] WITH ANESTHESIA N/A 10/28/2018   Procedure: IR WITH ANESTHESIA;  Surgeon: Radiologist, Medication, MD;  Location: [facility];  Service: Radiology;  Laterality: N/A;   REMOVAL OF A DIALYSIS CATHETER     RENAL BIOPSY, PERCUTANEOUS  2012   TONSILLECTOMY     VASCULAR SURGERY     WHIPPLE PROCEDURE  2002   duodenal ca, Dr. Harlow Asa    There were no vitals filed for this visit.   Subjective Assessment - 11/17/19 1145    Subjective He reports no falls. He is doing some of exercises 2 days / wk. With PT questioning reports too tired & too busy.  Wearing prostheses most of time when out of bed. He is out of bed 12-14 hrs / day with wear ~80%.    Pertinent History HTN, DM2, renal transplant, CKD st IV, calciphylaxis, A-Fib, bil. TTAs, TIA    Patient Stated Goals He would like to get off walker completely. Get close to prior level with prostheses which means no device in home & cane in community.    Currently in Pain? No/denies                             [facility] Adult PT Treatment/Exercise - 11/17/19 1145      Transfers   Transfers Sit to Stand;Stand to Sit    Sit to Stand 4: Min guard;5: Supervision;With upper extremity assist;Without upper extremity assist;With armrests;From chair/3-in-1    Sit to Stand  Details (indicate cue type and reason) worked on arising from [employer] 18" chair using armrests & stabilizing without touching counter,  arising from 24" bar stool requires single UE assist on chair placed beside stool, arising from 29" bar stool without UE assist but intermittent touch to stabilize.       Stand to Sit 5: Supervision;With upper extremity assist;With armrests;Without upper extremity assist;To chair/3-in-1    Stand to Sit Details sitting to 18" chair,  24" & 29" bar stool.     Comments --      Ambulation/Gait   Ambulation/Gait Yes    Ambulation/Gait Assistance 5: Supervision    Ambulation/Gait Assistance Details verbal & demo cues on knee extension heel contact at initial contact & upright posture.     Ambulation Distance (Feet) 130 Feet   130' X 2 cane, enter/exit building with RW   Assistive device Straight cane;Prostheses;Rolling walker   stand alone tip on cane   Gait Pattern Step-through pattern;Decreased arm swing - left;Decreased stride length;Left flexed knee in stance;Antalgic;Lateral hip instability;Trunk flexed;Wide base of support    Ambulation Surface Level;Indoor;Outdoor;Paved    Pre-Gait Activities counter & cane support: using tape on floor as markers Worked on initial contact with heel & knee extension & controlling step length       Therapeutic Activites    Therapeutic Activities ADL's    ADL's PT assessed patient loading RW in back seat of minivan and ambulating to driver's seat.  Pt appeared safe.       Neuro Re-ed    Neuro Re-ed Details  Patient was placing his cane in walker bag to exit and lost balance posteriorly requiring step strategy and required maxA from PT to prevent fall.        Knee/Hip Exercises: Machines for Strengthening   Cybex Leg Press --      Prosthetics   Prosthetic Care Comments  PT recommended appt with prosthetist for alignment check.                Access Code: [email or web address] URL: [email or web address] Date:  11/17/2019 Prepared by: [facility] - Outpatient Rehab Neuro  Exercises Monday, Wednesday, Friday Standing Marching - 1 x daily - 3 x weekly - 1 sets - 10 reps Standing Hip Abduction with Counter Support - 1 x daily - 3 x weekly - 1 sets - 10 reps Standing Hip Extension with Counter Support - 1 x daily - 3 x weekly - 1 sets - 10 reps   Stand on 1-2" block to improve clearance & improved hip extension motion Side Stepping with Counter Support - 1 x daily - 3 x weekly - 1 sets - 3 reps  Daily while at table with meal Seated Hamstring Stretch - 1-2 x daily - 7 x weekly - 1 sets - 3 reps - 30 hold Sit to Stand with Counter Support - 1 x daily - 7 x weekly - 1 sets - 10 reps   Tuesday, Thursday, Saturday Wide Stance with Counter Support - 1 x daily - 3 x weekly - 1 sets - 3 reps - 30 hold Wide Stance with Head Nods and Counter Support - 1 x daily - 3 x weekly - 1 sets - 10 reps Standing Forward Step Taps with Counter Support - 1 x daily - 3 x weekly - 1 sets - 10 reps - 3 hold   Position stance limb near counter & stepping limb back to enable clearance of prosthetic toes / alignment     PT Education - 11/17/19 1356    Education Details updated HEP into 3 groups to improve compliance and maintain well-rounded design of exercises    Person(s) Educated Patient    Methods Explanation;Demonstration;Tactile cues;Verbal cues;Handout    Comprehension Verbalized understanding;Returned demonstration;Verbal cues required;Tactile cues required            PT Short Term Goals - 11/17/19 1401      PT SHORT TERM GOAL #1   Title Patient  demonstrates & verbalizes understanding of initial HEP. (All STGs Target Date 11/19/2019)    Baseline MET 11/17/2019    Time 4   Pt [place] his [place] twice so STGs set for 4 weeks of care   Status Achieved    Target Date 11/19/19      PT SHORT TERM GOAL #2   Title Berg Balance >/= 36/56    Time 4    Period Weeks    Status On-going    Target Date 11/19/19       PT SHORT TERM GOAL #3   Title Dynamic Gait Index with cane >/= 12/24 with supervision    Time 4    Period Weeks    Status On-going    Target Date 11/19/19      PT SHORT TERM GOAL #4   Title Patient ambulates 200' with cane & prostheses with supervision.    Baseline MET 11/17/2019    Time 4    Period Weeks    Status Achieved    Target Date 11/19/19      PT SHORT TERM GOAL #5   Title Patient negotiates ramps, curbs & stairs single rail with cane & prostheses with minA.    Time 4    Period Weeks    Status On-going    Target Date 11/19/19             PT Long Term Goals - 10/06/19 1438      PT LONG TERM GOAL #1   Title Patient verbalizes & demonstrates understanding of ongoing fitness plan including home exercises & fitness center exercise program.  (All LTGs Target Date: 12/17/2019)    Time 8    Period Weeks    Status New    Target Date 12/17/19      PT LONG TERM GOAL #2   Title Berg >/= 45/56    Time 8    Period Weeks    Status New    Target Date 12/17/19      PT LONG TERM GOAL #3   Title Dynamic Gait Index with cane >/= 19/24 to indicate lower fall risk.    Time 8    Period Weeks    Status New    Target Date 12/17/19      PT LONG TERM GOAL #4   Title Patient ambulates >400' with cane & prostheses modified independent.    Time 8    Period Weeks    Status New    Target Date 12/17/19      PT LONG TERM GOAL #5   Title Patient negotiates ramps, curbs & stairs with single rail with cane & prostheses modified independent.    Time 8    Period Weeks    Status New    Target Date 12/17/19      PT LONG TERM GOAL #6   Title Patient ambulates 65' with prostheses only around furniture modified independent.    Time 8    Period Weeks    Status New    Target Date 12/17/19                 Plan - 11/17/19 1358    Clinical Impression Statement PT broke HEP into 3 groups to improve compliance and maintain well-rounded nature of program. PT modified how he was  performing a couple of exercises and he appears to understand modifications.  Patient had impaired step strategy in PT waiting room that would have probably resulted in fall if  PT was not present. He would benefit from PT activities to improve his step strategy.    Personal Factors and Comorbidities Comorbidity 3+    Comorbidities HTN, DM2, renal transplant, CKD st IV, calciphylaxis, A-Fib, bil. TTAs, TIA    Examination-Activity Limitations Locomotion Level;Squat;Stairs;Stand;Transfers    Examination-Participation Restrictions Community Activity    Stability/Clinical Decision Making Evolving/Moderate complexity    Rehab Potential Good    PT Frequency 2x / week    PT Duration 8 weeks    PT Treatment/Interventions ADLs/Self Care Home Management;DME Instruction;Gait training;Stair training;Functional mobility training;Therapeutic activities;Therapeutic exercise;Balance training;Neuromuscular re-education;Patient/family education;Prosthetic Training;Manual techniques;Vestibular    PT Next Visit Plan check remaining STGs, prosthetic gait training including ramp, curb,stairs, dynamic balance/gait including step strategy, Scifit for activity tolerance    Consulted and Agree with Plan of Care Patient           Patient will benefit from skilled therapeutic intervention in order to improve the following deficits and impairments:  Abnormal gait, Cardiopulmonary status limiting activity, Decreased activity tolerance, Decreased balance, Decreased endurance, Decreased mobility, Decreased range of motion, Decreased strength, Impaired flexibility, Postural dysfunction, Prosthetic Dependency  Visit Diagnosis: Other abnormalities of gait and mobility  Unsteadiness on feet  Abnormal posture  Weakness generalized  Stiffness of left hip, not elsewhere classified  Stiffness of right hip, not elsewhere classified     Problem List Patient Active Problem List   Diagnosis Date Noted   CKD stage 4 due to  type 2 diabetes mellitus ([place]) 04/29/2019   AKI (acute kidney injury) ([place]) 04/24/2019   Hyperglycemia 04/24/2019   Nausea and vomiting 04/24/2019   Fever 09/62/8366   Acute metabolic encephalopathy 29/47/6546   Stroke ([place]) 10/28/2018   Middle cerebral artery embolism, left 50/35/4656   Metabolic acidosis 81/27/5170   Dehydration 05/24/2018   Pressure injury of skin 05/24/2018   Dyspnea 05/23/2018   Acute respiratory failure with hypoxia ([facility]) 05/23/2018   Abnormal cardiovascular stress test 12/29/2013   Awaiting organ transplant 12/09/2013   Leg ulcer ([place]) 11/17/2013   Chronic kidney disease (CKD), stage V ([place]) 11/17/2013   Diabetes ([place]) 11/17/2013   Cancer of duodenum ([place]) 11/17/2013   Dyslipidemia 11/17/2013   Chronic kidney disease requiring chronic dialysis ([place]) 11/17/2013   Hypercholesteremia 11/17/2013   H/O malignant neoplasm 11/17/2013   H/O: HTN (hypertension) 11/17/2013   BP (high blood pressure) 11/17/2013   Hypotension 11/17/2013   Angiopathy, peripheral ([place]) 11/17/2013   AF (paroxysmal atrial fibrillation) ([place]) 11/17/2013   BKA stump complication ([place]) 01/74/9449   S/P bilateral BKA (below knee amputation) ([place]) 08/03/2013   S/P BKA (below knee amputation) bilateral ([place]) 07/30/2013   Open wound of both legs with complication 67/59/1638   Acute blood loss anemia 07/12/2013   Syncope 07/09/2013   Intolerance to CAPD peritoneal dialysis s/p CAPD cath removal 07/09/2013 07/05/2013   Critical lower limb ischemia 06/30/2013   Extremity pain 06/30/2013   Atherosclerosis of native arteries of the extremities with ulceration(440.23) 06/17/2013   Gait disorder 05/31/2013   Weakness 05/24/2013   Depression 04/03/2013   GERD (gastroesophageal reflux disease) 04/03/2013   Atrial fibrillation ([place]) 04/02/2013   DM (diabetes mellitus) ([place]) 46/65/9935   Other complications due to renal dialysis device, implant, and graft  05/19/2012   Diabetes mellitus, type 2 ([place]) 11/13/2011   Anemia 07/02/2011   ESRD on hemodialysis ([place]) 05/29/2011    Jamey Reas PT, DPT 11/17/2019, 2:06 PM  [facility] [address] [place], [place], [address]  Phone: [phone]   Fax:  [phone]  Name: Paul [name] MRN: [id number] Date of Birth: [birth year]

## 2019-11-23 ENCOUNTER — Ambulatory Visit: Payer: Managed Care, Other (non HMO) | Attending: Family Medicine | Admitting: Physical Therapy

## 2019-11-23 ENCOUNTER — Encounter: Payer: Self-pay | Admitting: Physical Therapy

## 2019-11-23 ENCOUNTER — Other Ambulatory Visit: Payer: Self-pay

## 2019-11-23 DIAGNOSIS — R2689 Other abnormalities of gait and mobility: Secondary | ICD-10-CM | POA: Insufficient documentation

## 2019-11-23 DIAGNOSIS — R2681 Unsteadiness on feet: Secondary | ICD-10-CM | POA: Diagnosis present

## 2019-11-23 DIAGNOSIS — R531 Weakness: Secondary | ICD-10-CM | POA: Insufficient documentation

## 2019-11-23 DIAGNOSIS — R293 Abnormal posture: Secondary | ICD-10-CM | POA: Diagnosis present

## 2019-11-23 NOTE — Therapy (Signed)
[facility] [address] [place], [place], [address] Phone: [phone]   Fax:  [phone]  Physical Therapy Treatment  Patient Details  Name: Paul [name] MRN: [id number] Date of Birth: 1955-06-11 Referring Provider (PT): [name], DO   Encounter Date: 11/23/2019   PT End of Session - 11/23/19 1321    Visit Number 7    Number of Visits 25    Date for PT Re-Evaluation 01/04/20    Authorization Type [employer]aged [employer] A & B    PT Start Time 1320    PT Stop Time 1400    PT Time Calculation (min) 40 min    Equipment Utilized During Treatment Gait belt    Activity Tolerance Patient tolerated treatment well;Patient limited by fatigue   limited by light-headedness   Behavior During Therapy [facility] for tasks assessed/performed           Past Medical History:  Diagnosis Date   A-fib ([place])    Anemia    esrd   Calciphylaxis 05/2013   lower ext   Cancer ([place])    hx duodenal adenoCa 2002, resected   Closed left arm fracture 1967   Corneal abrasion, left    Depression    Diabetes mellitus    Type 1 iddm x 11 yrs   Diabetic neuropathy ([place])    ESRD on hemodialysis ([place])    Pt has ESRD due to DM.  He had a L upper arm AVF prior to starting HD which was ligated due to L arm steal syndrome.  He started HD in Jan 2014 and did home HD.  The family couldn't cannulate the R arm AVF successfully so by mid 2014 they decided to switch to PD which was done late summer 2014.  In Jan 2015 he was admitted with FTT felt to be due to underdialysis on PD and PD was abandoned and he    GERD (gastroesophageal reflux disease)    Glomerulosclerosis, diabetic ([place])    Heart murmur    History of blood transfusion    Hypertension    off of meds due to orthostatic hypotension   Kidney transplant recipient    Open wound of both legs with complication    Pt has had progressive wounds of both LE's including gangrene of the  toes and patchy necrosis of the calves.  He has been treated at [facility] by Dr Jerline Pain with hyperbaric O2 5d per week.  He is getting Na Thiosulfate with HD for suspected calciphylaxis. Pt says doctor's aren't sure if these ulcers were diabetic ulcers or calciphylaxis.  He underwent bilat BKA on 07/30/13.  He says that the woun    Past Surgical History:  Procedure Laterality Date   AMPUTATION Bilateral 07/30/2013   Procedure: AMPUTATION BELOW KNEE;  Surgeon: Newt Minion, MD;  Location: [place];  Service: Orthopedics;  Laterality: Bilateral;  Bilateral Below Knee Amputations   AMPUTATION Bilateral 09/03/2013   Procedure: AMPUTATION BELOW KNEE;  Surgeon: Newt Minion, MD;  Location: [place];  Service: Orthopedics;  Laterality: Bilateral;  Bilateral Below Knee Amputation Revision   AV FISTULA PLACEMENT  06/07/2011   Procedure: ARTERIOVENOUS (AV) FISTULA CREATION;  Surgeon: Angelia Mould, MD;  Location: [place];  Service: Vascular;  Laterality: Left;   AV FISTULA PLACEMENT  06/18/2012   Procedure: ARTERIOVENOUS (AV) FISTULA CREATION;  Surgeon: Mal Misty, MD;  Location: [place];  Service: Vascular;  Laterality: Right;  [place]  DIVERSION, EXTERNAL  2002   BONE MARROW BIOPSY  2012   CAPD INSERTION N/A 01/14/2013   Procedure: LAPAROSCOPIC INSERTION CONTINUOUS AMBULATORY PERITONEAL DIALYSIS  (CAPD) CATHETER;  Surgeon: Adin Hector, MD;  Location: [place];  Service: General;  Laterality: N/A;   CAPD REMOVAL N/A 07/09/2013   Procedure: CONTINUOUS AMBULATORY PERITONEAL DIALYSIS  (CAPD) CATHETER REMOVAL;  Surgeon: Adin Hector, MD;  Location: [place];  Service: General;  Laterality: N/A;   COLONOSCOPY     Hx: of   INSERTION OF DIALYSIS CATHETER  05/22/2012   Procedure: INSERTION OF DIALYSIS CATHETER;  Surgeon: Mal Misty, MD;  Location: [employer];  Service: Vascular;  Laterality: N/A;  right internal jugular vein   IR ANGIO INTRA EXTRACRAN SEL COM CAROTID INNOMINATE BILAT MOD SED   10/28/2018   IR ANGIO VERTEBRAL SEL SUBCLAVIAN INNOMINATE UNI R MOD SED  10/28/2018   IR ANGIO VERTEBRAL SEL VERTEBRAL UNI L MOD SED  10/28/2018   LIGATION OF ARTERIOVENOUS  FISTULA  05/22/2012   Procedure: LIGATION OF ARTERIOVENOUS  FISTULA;  Surgeon: Mal Misty, MD;  Location: [place];  Service: Vascular;  Laterality: Left;  Left brachio-cephalic fistula   LOWER EXTREMITY ANGIOGRAM N/A 06/14/2013   Procedure: LOWER EXTREMITY ANGIOGRAM;  Surgeon: Angelia Mould, MD;  Location: [facility] CATH LAB;  Service: Cardiovascular;  Laterality: N/A;   NEPHRECTOMY TRANSPLANTED ORGAN     OTHER SURGICAL HISTORY     Cyst removed from back   [place] WITH ANESTHESIA N/A 10/28/2018   Procedure: IR WITH ANESTHESIA;  Surgeon: Radiologist, Medication, MD;  Location: [place];  Service: Radiology;  Laterality: N/A;   REMOVAL OF A DIALYSIS CATHETER     RENAL BIOPSY, PERCUTANEOUS  2012   TONSILLECTOMY     VASCULAR SURGERY     WHIPPLE PROCEDURE  2002   duodenal ca, Dr. Harlow Asa    There were no vitals filed for this visit.   Subjective Assessment - 11/23/19 1320    Subjective Pt. reports no new falls and no pain today. No changes since last visit.    Pertinent History HTN, DM2, renal transplant, CKD st IV, calciphylaxis, A-Fib, bil. TTAs, TIA    Patient Stated Goals He would like to get off walker completely. Get close to prior level with prostheses which means no device in home & cane in community.    Currently in Pain? No/denies              [facility] PT Assessment - 11/23/19 1321      Berg Balance Test   Sit to Stand Able to stand  independently using hands    Standing Unsupported Able to stand 2 minutes with supervision    Sitting with Back Unsupported but Feet Supported on Floor or Stool Able to sit safely and securely 2 minutes    Stand to Sit Controls descent by using hands    Transfers Needs one person to assist    Standing Unsupported with Eyes Closed Able to  stand 10 seconds with supervision    Standing Unsupported with Feet Together Able to place feet together independently and stand for 1 minute with supervision    From Standing, Reach Forward with Outstretched Arm Can reach forward >5 cm safely (2")    From Standing Position, Pick up Object from [place] to pick up shoe, needs supervision    From Standing Position, Turn to Look Behind Over each Shoulder Needs supervision when turning  Turn 360 Degrees Needs assistance while turning    Standing Unsupported, Alternately Place Feet on Step/Stool Needs assistance to keep from falling or unable to try    Standing Unsupported, One Foot in Front Needs help to step but can hold 15 seconds    Standing on One Leg Unable to try or needs assist to prevent fall    Total Score 27    Berg comment: 27/56= high fall risk             [facility] Adult PT Treatment/Exercise - 11/23/19 1321      Ambulation/Gait   Ambulation/Gait Yes    Ambulation/Gait Assistance 5: Supervision    Ambulation/Gait Assistance Details Gait with RW today to progress with gait distance and improve endurance; pt. verbally reported feeling "unsteady" when using cane for longer distances    Ambulation Distance (Feet) 230 Feet    Assistive device Rolling walker;Prostheses    Gait Pattern Step-through pattern;Decreased arm swing - right;Decreased arm swing - left;Trunk flexed    Ambulation Surface Level;Indoor      Knee/Hip Exercises: Standing   Hip Abduction AROM;Stengthening;Both;1 set;10 reps;Knee straight;Limitations    Abduction Limitations performed with UE support on chair and bil. prostheses; VC's needed for pt. to maintain upright posture    Hip Extension Stengthening;AROM;Both;1 set;10 reps;Knee straight    Extension Limitations Performed standing at chair with UE support and bil. prostheses; pt. displayed fatigue during last few reps as shown by forward leaning and increased support by UE use             PT Short Term  Goals - 11/23/19 1412      PT SHORT TERM GOAL #1   Title Patient demonstrates & verbalizes understanding of initial HEP. (All STGs Target Date 11/19/2019)    Baseline MET 11/17/2019    Time 4   [place] so STGs set for 4 weeks of care   Status Achieved    Target Date 11/19/19      PT SHORT TERM GOAL #2   Title Berg Balance >/= 36/56    Baseline 11/23/19 Berg Balance= 27/56 (inc. of 1 pt.)    Time 4    Period Weeks    Status Partially Met    Target Date 11/19/19      PT SHORT TERM GOAL #3   Title Dynamic Gait Index with cane >/= 12/24 with supervision    Time 4    Period Weeks    Status On-going    Target Date 11/19/19      PT SHORT TERM GOAL #4   Title Patient ambulates 200' with cane & prostheses with supervision.    Baseline MET 11/17/2019    Time 4    Period Weeks    Status Achieved    Target Date 11/19/19      PT SHORT TERM GOAL #5   Title Patient negotiates ramps, curbs & stairs single rail with cane & prostheses with minA.    Time 4    Period Weeks    Status On-going    Target Date 11/19/19             PT Long Term Goals - 10/06/19 1438      PT LONG TERM GOAL #1   Title Patient verbalizes & demonstrates understanding of ongoing fitness plan including home exercises & fitness center exercise program.  (All LTGs Target Date: 12/17/2019)    Time 8    Period Weeks  Status New    Target Date 12/17/19      PT LONG TERM GOAL #2   Title Berg >/= 45/56    Time 8    Period Weeks    Status New    Target Date 12/17/19      PT LONG TERM GOAL #3   Title Dynamic Gait Index with cane >/= 19/24 to indicate lower fall risk.    Time 8    Period Weeks    Status New    Target Date 12/17/19      PT LONG TERM GOAL #4   Title Patient ambulates >400' with cane & prostheses modified independent.    Time 8    Period Weeks    Status New    Target Date 12/17/19      PT LONG TERM GOAL #5   Title Patient negotiates ramps, curbs & stairs with  single rail with cane & prostheses modified independent.    Time 8    Period Weeks    Status New    Target Date 12/17/19      PT LONG TERM GOAL #6   Title Patient ambulates 29' with prostheses only around furniture modified independent.    Time 8    Period Weeks    Status New    Target Date 12/17/19             Plan - 11/23/19 1403    Clinical Impression Statement Todays therapy session placed emphasis on checking STG #2 (BERG) as well as bil. LE strengthening. Pt. progressed toward goal #2. Per pt. report he still does not feel steady using a cane so that is why the RW was used in today's session, with increased gait distance to improve activity tolerance. Additional comments added by supervising PTA: pt reports today that he is mostly using RW and bil forearm crutches due to feeling very off balance with cane for any distance beyond household distances. Will continue to progress gait distance with cane as pt's endurance, balance and strength improve.  Pt. will benefit form further PT in order to improve his LE strength and balance with bil. prostheses to maximize function.    Personal Factors and Comorbidities Comorbidity 3+    Comorbidities HTN, DM2, renal transplant, CKD st IV, calciphylaxis, A-Fib, bil. TTAs, TIA    Examination-Activity Limitations Locomotion Level;Squat;Stairs;Stand;Transfers    Examination-Participation Restrictions Community Activity    Stability/Clinical Decision Making Evolving/Moderate complexity    Rehab Potential Good    PT Frequency 2x / week    PT Duration 8 weeks    PT Treatment/Interventions ADLs/Self Care Home Management;DME Instruction;Gait training;Stair training;Functional mobility training;Therapeutic activities;Therapeutic exercise;Balance training;Neuromuscular re-education;Patient/family education;Prosthetic Training;Manual techniques;Vestibular    PT Next Visit Plan check remaining STGs, begin session with gait training with cane before pt. is  fatigued    Consulted and Agree with Plan of Care Patient           Patient will benefit from skilled therapeutic intervention in order to improve the following deficits and impairments:  Abnormal gait, Cardiopulmonary status limiting activity, Decreased activity tolerance, Decreased balance, Decreased endurance, Decreased mobility, Decreased range of motion, Decreased strength, Impaired flexibility, Postural dysfunction, Prosthetic Dependency  Visit Diagnosis: Other abnormalities of gait and mobility  Unsteadiness on feet  Weakness generalized     Problem List Patient Active Problem List   Diagnosis Date Noted   CKD stage 4 due to type 2 diabetes mellitus ([place]) 04/29/2019   AKI (acute kidney injury) ([place]) 04/24/2019  Hyperglycemia 04/24/2019   Nausea and vomiting 04/24/2019   Fever 58/85/0277   Acute metabolic encephalopathy 41/28/7867   Stroke ([place]) 10/28/2018   Middle cerebral artery embolism, left 67/20/9470   Metabolic acidosis 96/28/3662   Dehydration 05/24/2018   Pressure injury of skin 05/24/2018   Dyspnea 05/23/2018   Acute respiratory failure with hypoxia ([facility]) 05/23/2018   Abnormal cardiovascular stress test 12/29/2013   Awaiting organ transplant 12/09/2013   Leg ulcer ([place]) 11/17/2013   Chronic kidney disease (CKD), stage V ([place]) 11/17/2013   Diabetes ([place]) 11/17/2013   Cancer of duodenum ([place]) 11/17/2013   Dyslipidemia 11/17/2013   Chronic kidney disease requiring chronic dialysis ([place]) 11/17/2013   Hypercholesteremia 11/17/2013   H/O malignant neoplasm 11/17/2013   H/O: HTN (hypertension) 11/17/2013   BP (high blood pressure) 11/17/2013   Hypotension 11/17/2013   Angiopathy, peripheral ([place]) 11/17/2013   AF (paroxysmal atrial fibrillation) ([place]) 11/17/2013   BKA stump complication ([place]) 94/76/5465   S/P bilateral BKA (below knee amputation) ([place]) 08/03/2013   S/P BKA (below knee amputation) bilateral ([place]) 07/30/2013     Open wound of both legs with complication 03/54/6568   Acute blood loss anemia 07/12/2013   Syncope 07/09/2013   Intolerance to CAPD peritoneal dialysis s/p CAPD cath removal 07/09/2013 07/05/2013   Critical lower limb ischemia 06/30/2013   Extremity pain 06/30/2013   Atherosclerosis of native arteries of the extremities with ulceration(440.23) 06/17/2013   Gait disorder 05/31/2013   Weakness 05/24/2013   Depression 04/03/2013   GERD (gastroesophageal reflux disease) 04/03/2013   Atrial fibrillation ([place]) 04/02/2013   DM (diabetes mellitus) ([place]) 12/75/1700   Other complications due to renal dialysis device, implant, and graft 05/19/2012   Diabetes mellitus, type 2 ([place]) 11/13/2011   Anemia 07/02/2011   ESRD on hemodialysis ([place]) 05/29/2011    Royann Shivers, SPTA 11/23/2019, 2:41 PM  [facility] [address] [place], [place], [address] Phone: [phone]   Fax:  [phone]  Name: TORELL MINDER MRN: [id number] Date of Birth: 12-Aug-1955  This note has been reviewed and edited by supervising CI.  Willow Ora, PTA, [facility] [address], [place], [place] [address] [phone] 11/24/19, 12:14 PM

## 2019-11-25 ENCOUNTER — Encounter: Payer: Managed Care, Other (non HMO) | Admitting: Physical Therapy

## 2019-11-26 ENCOUNTER — Other Ambulatory Visit: Payer: Self-pay

## 2019-11-26 ENCOUNTER — Ambulatory Visit: Payer: Managed Care, Other (non HMO) | Admitting: Physical Therapy

## 2019-11-26 ENCOUNTER — Encounter: Payer: Self-pay | Admitting: Physical Therapy

## 2019-11-26 DIAGNOSIS — R2689 Other abnormalities of gait and mobility: Secondary | ICD-10-CM

## 2019-11-26 DIAGNOSIS — R2681 Unsteadiness on feet: Secondary | ICD-10-CM

## 2019-11-26 DIAGNOSIS — R531 Weakness: Secondary | ICD-10-CM

## 2019-11-26 NOTE — Therapy (Signed)
[facility] [address] [place], [place], [address] Phone: [phone]   Fax:  [phone]  Physical Therapy Treatment  Patient Details  Name: QUANTEL MCINTURFF MRN: [id number] Date of Birth: 08-14-1955 Referring Provider (PT): Marlin Canary, DO   Encounter Date: 11/26/2019   PT End of Session - 11/26/19 1337    Visit Number 8    Number of Visits 25    Date for PT Re-Evaluation 01/04/20    Authorization Type [employer] Managed Medicare A & B    PT Start Time 1333   pt. late for apt.   PT Stop Time 1400    PT Time Calculation (min) 27 min    Equipment Utilized During Treatment Gait belt    Activity Tolerance Patient tolerated treatment well;Patient limited by fatigue   limited by light-headedness   Behavior During Therapy [facility] for tasks assessed/performed           Past Medical History:  Diagnosis Date   A-fib (HCC)    Anemia    esrd   Calciphylaxis 05/2013   lower ext   Cancer [facility])    hx duodenal adenoCa 2002, resected   Closed left arm fracture 1967   Corneal abrasion, left    Depression    Diabetes mellitus    Type 1 iddm x 11 yrs   Diabetic neuropathy (HCC)    ESRD on hemodialysis (HCC)    Pt has ESRD due to DM.  He had a L upper arm AVF prior to starting HD which was ligated due to L arm steal syndrome.  He started HD in Jan 2014 and did home HD.  The family couldn't cannulate the R arm AVF successfully so by mid 2014 they decided to switch to PD which was done late summer 2014.  In Jan 2015 he was admitted with FTT felt to be due to underdialysis on PD and PD was abandoned and he    GERD (gastroesophageal reflux disease)    Glomerulosclerosis, diabetic (HCC)    Heart murmur    History of blood transfusion    Hypertension    off of meds due to orthostatic hypotension   Kidney transplant recipient    Open wound of both legs with complication    Pt has had progressive wounds of both LE's  including gangrene of the toes and patchy necrosis of the calves.  [facility] Jimmey Ralph with hyperbaric O2 5d per week.  He is getting Na Thiosulfate with HD for suspected calciphylaxis. Pt says doctor's aren't sure if these ulcers were diabetic ulcers or calciphylaxis.  He underwent bilat BKA on 07/30/13.  He says that the woun    Past Surgical History:  Procedure Laterality Date   AMPUTATION Bilateral 07/30/2013   Procedure: AMPUTATION BELOW KNEE;  Surgeon: Nadara Mustard, MD;  Location: [facility] OR;  Service: Orthopedics;  Laterality: Bilateral;  Bilateral Below Knee Amputations   AMPUTATION Bilateral 09/03/2013   Procedure: AMPUTATION BELOW KNEE;  Surgeon: Nadara Mustard, MD;  Location: [facility] OR;  Service: Orthopedics;  Laterality: Bilateral;  Bilateral Below Knee Amputation Revision   AV FISTULA PLACEMENT  06/07/2011   Procedure: ARTERIOVENOUS (AV) FISTULA CREATION;  Surgeon: Chuck Hint, MD;  [facility] OR;  Service: Vascular;  Laterality: Left;   AV FISTULA PLACEMENT  06/18/2012   Procedure: ARTERIOVENOUS (AV) FISTULA CREATION;  Surgeon: Pryor Ochoa, MD;  Location: [facility] OR;  Service: Vascular;  Laterality: Right;  [place], EXTERNAL  2002   BONE MARROW BIOPSY  2012   CAPD INSERTION N/A 01/14/2013   Procedure: LAPAROSCOPIC INSERTION CONTINUOUS AMBULATORY PERITONEAL DIALYSIS  (CAPD) CATHETER;  Surgeon: Adin Hector, MD;  Location: [place];  Service: General;  Laterality: N/A;   CAPD REMOVAL N/A 07/09/2013   Procedure: CONTINUOUS AMBULATORY PERITONEAL DIALYSIS  (CAPD) CATHETER REMOVAL;  Surgeon: Adin Hector, MD;  Location: [place];  Service: General;  Laterality: N/A;   COLONOSCOPY     Hx: of   INSERTION OF DIALYSIS CATHETER  05/22/2012   Procedure: INSERTION OF DIALYSIS CATHETER;  Surgeon: Mal Misty, MD;  Location: [place];  Service: Vascular;  Laterality: N/A;  right internal jugular vein   IR ANGIO INTRA EXTRACRAN SEL COM CAROTID  INNOMINATE BILAT MOD SED  10/28/2018   IR ANGIO VERTEBRAL SEL SUBCLAVIAN INNOMINATE UNI R MOD SED  10/28/2018   IR ANGIO VERTEBRAL SEL VERTEBRAL UNI L MOD SED  10/28/2018   LIGATION OF ARTERIOVENOUS  FISTULA  05/22/2012   Procedure: LIGATION OF ARTERIOVENOUS  FISTULA;  Surgeon: Mal Misty, MD;  Location: [place];  Service: Vascular;  Laterality: Left;  Left brachio-cephalic fistula   LOWER EXTREMITY ANGIOGRAM N/A 06/14/2013   Procedure: LOWER EXTREMITY ANGIOGRAM;  Surgeon: Angelia Mould, MD;  Location: [facility] CATH LAB;  Service: Cardiovascular;  Laterality: N/A;   NEPHRECTOMY TRANSPLANTED ORGAN     OTHER SURGICAL HISTORY     Cyst removed from back   [place] WITH ANESTHESIA N/A 10/28/2018   Procedure: IR WITH ANESTHESIA;  Surgeon: Radiologist, Medication, MD;  Location: [place];  Service: Radiology;  Laterality: N/A;   REMOVAL OF A DIALYSIS CATHETER     RENAL BIOPSY, PERCUTANEOUS  2012   TONSILLECTOMY     VASCULAR SURGERY     WHIPPLE PROCEDURE  2002   duodenal ca, Dr. Harlow Asa    There were no vitals filed for this visit.   Subjective Assessment - 11/26/19 1502    Subjective No changes, no new falls. Pt. reports some pain of bil. residual limbs.    Currently in Pain? Yes    Pain Score 5     Pain Location Leg    Pain Orientation Left;Right    Pain Descriptors / Indicators Dull    Pain Type Phantom pain    Pain Frequency Occasional    Multiple Pain Sites No                 [facility] Adult PT Treatment/Exercise - 11/26/19 1405      Transfers   Transfers Sit to Stand;Stand to Sit    Sit to Stand 4: Min guard    Sit to Stand Details Verbal cues for precautions/safety    Stand to Sit 4: Min guard    Number of Reps 1 set;10 reps    Comments sit <> stand from 29 in. stool, working on standing without UE support; pt. encouraged to not use countertop when standing, and to catch balance when up without UE use. Pt. took rest breaks bewtween each  rep secondary to fatigue.      Ambulation/Gait   Ambulation/Gait Yes    Ambulation/Gait Assistance 4: Min guard    Ambulation/Gait Assistance Details cues for posture and cane placement. pt with wide base of support and unsteady gait with use of cane in session. supervision with RW.     Ambulation Distance (Feet) 35 Feet   x 1,  80 ft. x 1   Assistive device Straight cane;Prostheses    Gait Pattern Step-through pattern    Ambulation Surface Level;Indoor    Stairs Yes    Ramp Details (indicate cue type and reason) performed with straight cane and prostheses with cues for posture and sequenicing.     Gait Comments pt. showed hesitancy when ambulating on ramp & stairs with cane, needing a few seconds to balance prior to initiation                  PT Short Term Goals - 11/26/19 1343      PT SHORT TERM GOAL #1   Title Patient demonstrates & verbalizes understanding of initial HEP. (All STGs Target Date 11/19/2019)    Baseline MET 11/17/2019    Time 4   [place] so STGs set for 4 weeks of care   Status Achieved    Target Date 11/19/19      PT SHORT TERM GOAL #2   Title Berg Balance >/= 36/56    Baseline 11/23/19 Berg Balance= 27/56 (inc. of 1 pt.)    Time 4    Period Weeks    Status Partially Met    Target Date 11/19/19      PT SHORT TERM GOAL #3   Title Dynamic Gait Index with cane >/= 12/24 with supervision    Time 4    Period Weeks    Status Deferred    Target Date 11/19/19      PT SHORT TERM GOAL #4   Title Patient ambulates 200' with cane & prostheses with supervision.    Baseline MET 11/17/2019    Time 4    Period Weeks    Status Achieved    Target Date 11/19/19      PT SHORT TERM GOAL #5   Title Patient negotiates ramps, curbs & stairs single rail with cane & prostheses with minA.    Baseline 11/26/19: Patient negotiates ramps & stairs single rail with cane & prostheses with min A. curb not yet assessed.    Time 4    Period Weeks    Status  Partially Met    Target Date 11/19/19             PT Long Term Goals - 10/06/19 1438      PT LONG TERM GOAL #1   Title Patient verbalizes & demonstrates understanding of ongoing fitness plan including home exercises & fitness center exercise program.  (All LTGs Target Date: 12/17/2019)    Time 8    Period Weeks    Status New    Target Date 12/17/19      PT LONG TERM GOAL #2   Title Berg >/= 45/56    Time 8    Period Weeks    Status New    Target Date 12/17/19      PT LONG TERM GOAL #3   Title Dynamic Gait Index with cane >/= 19/24 to indicate lower fall risk.    Time 8    Period Weeks    Status New    Target Date 12/17/19      PT LONG TERM GOAL #4   Title Patient ambulates >400' with cane & prostheses modified independent.    Time 8    Period Weeks    Status New    Target Date 12/17/19      PT LONG TERM GOAL #5   Title Patient negotiates ramps, curbs &  stairs with single rail with cane & prostheses modified independent.    Time 8    Period Weeks    Status New    Target Date 12/17/19      PT LONG TERM GOAL #6   Title Patient ambulates 44' with prostheses only around furniture modified independent.    Time 8    Period Weeks    Status New    Target Date 12/17/19              Plan - 11/26/19 1428    Clinical Impression Statement Todays therapy session placed emphasis on checking remaining STG's as well as working on sit to stands without UE support. The session was cut short due to pt. being late. Pt. had an episode of LOB that required assist by SPTA when walking back to the mat. Pt. required several rest breaks today secondary to fatigue, and showed hesitancy ambulating with cane. Pt. will benefit from further PT in order to increase activity tolerance, balance, and functional LE strength.    Personal Factors and Comorbidities Comorbidity 3+    Comorbidities HTN, DM2, renal transplant, CKD st IV, calciphylaxis, A-Fib, bil. TTAs, TIA    Examination-Activity  Limitations Locomotion Level;Squat;Stairs;Stand;Transfers    Examination-Participation Restrictions Community Activity    Stability/Clinical Decision Making Evolving/Moderate complexity    Rehab Potential Good    PT Frequency 2x / week    PT Duration 8 weeks    PT Treatment/Interventions ADLs/Self Care Home Management;DME Instruction;Gait training;Stair training;Functional mobility training;Therapeutic activities;Therapeutic exercise;Balance training;Neuromuscular re-education;Patient/family education;Prosthetic Training;Manual techniques;Vestibular    PT Next Visit Plan Continue gait training with cane, strengthening and balance training   Consulted and Agree with Plan of Care Patient           Patient will benefit from skilled therapeutic intervention in order to improve the following deficits and impairments:  Abnormal gait, Cardiopulmonary status limiting activity, Decreased activity tolerance, Decreased balance, Decreased endurance, Decreased mobility, Decreased range of motion, Decreased strength, Impaired flexibility, Postural dysfunction, Prosthetic Dependency  Visit Diagnosis: Other abnormalities of gait and mobility  Unsteadiness on feet  Weakness generalized     Problem List Patient Active Problem List   Diagnosis Date Noted   CKD stage 4 due to type 2 diabetes mellitus ([place]) 04/29/2019   AKI (acute kidney injury) ([place]) 04/24/2019   Hyperglycemia 04/24/2019   Nausea and vomiting 04/24/2019   Fever 38/45/3646   Acute metabolic encephalopathy 80/32/1224   Stroke ([place]) 10/28/2018   Middle cerebral artery embolism, left 82/50/0370   Metabolic acidosis 48/88/9169   Dehydration 05/24/2018   Pressure injury of skin 05/24/2018   Dyspnea 05/23/2018   Acute respiratory failure with hypoxia ([place]) 05/23/2018   Abnormal cardiovascular stress test 12/29/2013   Awaiting organ transplant 12/09/2013   Leg ulcer ([place]) 11/17/2013   Chronic kidney disease (CKD),  stage V ([place]) 11/17/2013   Diabetes ([place]) 11/17/2013   Cancer of duodenum ([place]) 11/17/2013   Dyslipidemia 11/17/2013   Chronic kidney disease requiring chronic dialysis ([place]) 11/17/2013   Hypercholesteremia 11/17/2013   H/O malignant neoplasm 11/17/2013   H/O: HTN (hypertension) 11/17/2013   BP (high blood pressure) 11/17/2013   Hypotension 11/17/2013   Angiopathy, peripheral ([place]) 11/17/2013   AF (paroxysmal atrial fibrillation) ([place]) 11/17/2013   BKA stump complication ([place]) 45/07/8880   S/P bilateral BKA (below knee amputation) ([place]) 08/03/2013   S/P BKA (below knee amputation) bilateral ([place]) 07/30/2013   Open wound of both legs with complication 80/07/4915   Acute blood loss  anemia 07/12/2013   Syncope 07/09/2013   Intolerance to CAPD peritoneal dialysis s/p CAPD cath removal 07/09/2013 07/05/2013   Critical lower limb ischemia 06/30/2013   Extremity pain 06/30/2013   Atherosclerosis of native arteries of the extremities with ulceration(440.23) 06/17/2013   Gait disorder 05/31/2013   Weakness 05/24/2013   Depression 04/03/2013   GERD (gastroesophageal reflux disease) 04/03/2013   Atrial fibrillation ([place]) 04/02/2013   DM (diabetes mellitus) ([place]) 33/43/5686   Other complications due to renal dialysis device, implant, and graft 05/19/2012   Diabetes mellitus, type 2 ([place]) 11/13/2011   Anemia 07/02/2011   ESRD on hemodialysis ([place]) 05/29/2011    Royann Shivers, SPTA 11/26/2019 3:30 PM  This note has been reviewed and edited by supervising CI.  Willow Ora, PTA, [place] [address], [place], [place] [address] [phone] 11/26/19, 3:30 PM   Name: UGOCHUKWU CHICHESTER MRN: [id number] Date of Birth: 09/09/1955

## 2019-11-30 ENCOUNTER — Ambulatory Visit: Payer: Managed Care, Other (non HMO) | Admitting: Physical Therapy

## 2019-11-30 ENCOUNTER — Encounter (HOSPITAL_COMMUNITY): Payer: Managed Care, Other (non HMO)

## 2019-11-30 ENCOUNTER — Encounter: Payer: Self-pay | Admitting: Physical Therapy

## 2019-11-30 ENCOUNTER — Other Ambulatory Visit: Payer: Self-pay

## 2019-11-30 DIAGNOSIS — R2689 Other abnormalities of gait and mobility: Secondary | ICD-10-CM | POA: Diagnosis not present

## 2019-11-30 DIAGNOSIS — R2681 Unsteadiness on feet: Secondary | ICD-10-CM

## 2019-11-30 DIAGNOSIS — R293 Abnormal posture: Secondary | ICD-10-CM

## 2019-11-30 DIAGNOSIS — R531 Weakness: Secondary | ICD-10-CM

## 2019-12-02 NOTE — Therapy (Signed)
[facility] [address] [place], [place], [address] Phone: [phone]   Fax:  [phone]  Physical Therapy Treatment  Patient Details  Name: Paul [name] MRN: [id number] Date of Birth: 1956/02/12 Referring Provider (PT): [name], DO   Encounter Date: 11/30/2019     11/30/19 1320  PT Visits / Re-Eval  Visit Number 9  Number of Visits 25  Date for PT Re-Evaluation 01/04/20  Authorization  Authorization Type [employer]aged [employer] A & B  PT Time Calculation  PT Start Time 2376  PT Stop Time 1400  PT Time Calculation (min) 43 min  PT - End of Session  Equipment Utilized During Treatment Gait belt  Activity Tolerance Patient tolerated treatment well;Patient limited by fatigue  Behavior During Therapy [facility] for tasks assessed/performed    Past Medical History:  Diagnosis Date  . A-fib ([place])   . Anemia    esrd  . Calciphylaxis 05/2013   lower ext  . Cancer ([place])    hx duodenal adenoCa 2002, resected  . Closed left arm fracture 1967  . Corneal abrasion, left   . Depression   . Diabetes mellitus    Type 1 iddm x 11 yrs  . Diabetic neuropathy ([place])   . ESRD on hemodialysis ([place])    Pt has ESRD due to DM.  He had a L upper arm AVF prior to starting HD which was ligated due to L arm steal syndrome.  He started HD in Jan 2014 and did home HD.  The family couldn't cannulate the R arm AVF successfully so by mid 2014 they decided to switch to PD which was done late summer 2014.  In Jan 2015 he was admitted with FTT felt to be due to underdialysis on PD and PD was abandoned and he   . GERD (gastroesophageal reflux disease)   . Glomerulosclerosis, diabetic ([place])   . Heart murmur   . History of blood transfusion   . Hypertension    off of meds due to orthostatic hypotension  . Kidney transplant recipient   . Open wound of both legs with complication    Pt has had progressive wounds of both LE's including gangrene  of the toes and patchy necrosis of the calves.  He has been treated at [facility] by Dr Jerline Pain with hyperbaric O2 5d per week.  He is getting Na Thiosulfate with HD for suspected calciphylaxis. Pt says doctor's aren't sure if these ulcers were diabetic ulcers or calciphylaxis.  He underwent bilat BKA on 07/30/13.  He says that the woun    Past Surgical History:  Procedure Laterality Date  . AMPUTATION Bilateral 07/30/2013   Procedure: AMPUTATION BELOW KNEE;  Surgeon: Newt Minion, MD;  Location: [facility];  Service: Orthopedics;  Laterality: Bilateral;  Bilateral Below Knee Amputations  . AMPUTATION Bilateral 09/03/2013   Procedure: AMPUTATION BELOW KNEE;  Surgeon: Newt Minion, MD;  Location: [facility];  Service: Orthopedics;  Laterality: Bilateral;  Bilateral Below Knee Amputation Revision  . AV FISTULA PLACEMENT  06/07/2011   Procedure: ARTERIOVENOUS (AV) FISTULA CREATION;  Surgeon: Angelia Mould, MD;  Location: [facility];  Service: Vascular;  Laterality: Left;  . AV FISTULA PLACEMENT  06/18/2012   Procedure: ARTERIOVENOUS (AV) FISTULA CREATION;  Surgeon: Mal Misty, MD;  Location: [facility];  Service: Vascular;  Laterality: Right;  [place]  . BILIARY DIVERSION, EXTERNAL  2002  . BONE MARROW BIOPSY  2012  . CAPD INSERTION N/A 01/14/2013  Procedure: LAPAROSCOPIC INSERTION CONTINUOUS AMBULATORY PERITONEAL DIALYSIS  (CAPD) CATHETER;  Surgeon: Adin Hector, MD;  Location: [facility];  Service: General;  Laterality: N/A;  . CAPD REMOVAL N/A 07/09/2013   Procedure: CONTINUOUS AMBULATORY PERITONEAL DIALYSIS  (CAPD) CATHETER REMOVAL;  Surgeon: Adin Hector, MD;  Location: [facility];  Service: General;  Laterality: N/A;  . COLONOSCOPY     Hx: of  . INSERTION OF DIALYSIS CATHETER  05/22/2012   Procedure: INSERTION OF DIALYSIS CATHETER;  Surgeon: Mal Misty, MD;  Location: [facility];  Service: Vascular;  Laterality: N/A;  right internal jugular vein  . IR ANGIO INTRA EXTRACRAN SEL COM CAROTID INNOMINATE BILAT MOD  SED  10/28/2018  . IR ANGIO VERTEBRAL SEL SUBCLAVIAN INNOMINATE UNI R MOD SED  10/28/2018  . IR ANGIO VERTEBRAL SEL VERTEBRAL UNI L MOD SED  10/28/2018  . LIGATION OF ARTERIOVENOUS  FISTULA  05/22/2012   Procedure: LIGATION OF ARTERIOVENOUS  FISTULA;  Surgeon: Mal Misty, MD;  Location: [facility];  Service: Vascular;  Laterality: Left;  Left brachio-cephalic fistula  . LOWER EXTREMITY ANGIOGRAM N/A 06/14/2013   Procedure: LOWER EXTREMITY ANGIOGRAM;  Surgeon: Angelia Mould, MD;  Location: [facility] CATH LAB;  Service: Cardiovascular;  Laterality: N/A;  . NEPHRECTOMY TRANSPLANTED ORGAN    . OTHER SURGICAL HISTORY     Cyst removed from back  . PORTACATH PLACEMENT  2002  . RADIOLOGY WITH ANESTHESIA N/A 10/28/2018   Procedure: IR WITH ANESTHESIA;  Surgeon: Radiologist, Medication, MD;  Location: [place];  Service: Radiology;  Laterality: N/A;  . REMOVAL OF A DIALYSIS CATHETER    . RENAL BIOPSY, PERCUTANEOUS  2012  . TONSILLECTOMY    . VASCULAR SURGERY    . WHIPPLE PROCEDURE  2002   duodenal ca, Dr. Harlow Asa    There were no vitals filed for this visit.   11/30/19 1319  Symptoms/Limitations  Subjective No new complaints. No falls or pain to report.  Pertinent History HTN, DM2, renal transplant, CKD st IV, calciphylaxis, A-Fib, bil. TTAs, TIA  Patient Stated Goals He would like to get off walker completely. Get close to prior level with prostheses which means no device in home & cane in community.  Pain Assessment  Currently in Pain? No/denies      11/30/19 1321  Transfers  Transfers Sit to Stand;Stand to Sit  Sit to Stand 4: Min guard;With upper extremity assist;From chair/3-in-1;From bed  Stand to Sit 4: Min guard;With upper extremity assist;To bed;To chair/3-in-1  Ambulation/Gait  Ambulation/Gait Yes  Ambulation/Gait Assistance 4: Min guard;4: Min assist  Ambulation/Gait Assistance Details cues on posture, more narrowed base of support and cane placement. occasional min assist cue to  increased lateral sway with gait.   Ambulation Distance (Feet) 115 Feet (x1)  Assistive device Straight cane;Prostheses  Gait Pattern Step-through pattern;Decreased stride length;Trunk flexed;Wide base of support  Ambulation Surface Level;Indoor  High Level Balance  High Level Balance Activities Side stepping;Backward walking;Negotiating over obstacles  High Level Balance Comments next to counter with bil prostheses: side stepping left<>right with light UE touch to counter, then backward gait with counter/HHA, for 3-4 laps each. cues on posture, step length and ex form/technique; with cane in one hand/counter vs HHA opposite cane- forward stepping over 3 bolsters of varied heights for 4 laps with cues on sequencing and increased step height/length, then lateral stepping over bolsters with bil UE support on counter with cues on step lenght/height and posture. min guard to min assist with counter activities.  Neuro Re-ed   Neuro Re-ed Details  for balance/muscle re-ed: static standing with red band- rows, shoulder extension, then 3 way pulls all for 10 reps each. cues/faciliation for posture, cues on form/technique. rest breaks needed between each exercise         PT Short Term Goals - 11/26/19 1343      PT SHORT TERM GOAL #1   Title Patient demonstrates & verbalizes understanding of initial HEP. (All STGs Target Date 11/19/2019)    Baseline MET 11/17/2019    Time 4   [place] so STGs set for 4 weeks of care   Status Achieved    Target Date 11/19/19      PT SHORT TERM GOAL #2   Title Berg Balance >/= 36/56    Baseline 11/23/19 Berg Balance= 27/56 (inc. of 1 pt.)    Time 4    Period Weeks    Status Partially Met    Target Date 11/19/19      PT SHORT TERM GOAL #3   Title Dynamic Gait Index with cane >/= 12/24 with supervision    Time 4    Period Weeks    Status Deferred    Target Date 11/19/19      PT SHORT TERM GOAL #4   Title Patient ambulates 200' with  cane & prostheses with supervision.    Baseline MET 11/17/2019    Time 4    Period Weeks    Status Achieved    Target Date 11/19/19      PT SHORT TERM GOAL #5   Title Patient negotiates ramps, curbs & stairs single rail with cane & prostheses with minA.    Baseline 11/26/19: Patient negotiates ramps & stairs single rail with cane & prostheses with min A. curb not yet assessed.    Time 4    Period Weeks    Status Partially Met    Target Date 11/19/19             PT Long Term Goals - 10/06/19 1438      PT LONG TERM GOAL #1   Title Patient verbalizes & demonstrates understanding of ongoing fitness plan including home exercises & fitness center exercise program.  (All LTGs Target Date: 12/17/2019)    Time 8    Period Weeks    Status New    Target Date 12/17/19      PT LONG TERM GOAL #2   Title Berg >/= 45/56    Time 8    Period Weeks    Status New    Target Date 12/17/19      PT LONG TERM GOAL #3   Title Dynamic Gait Index with cane >/= 19/24 to indicate lower fall risk.    Time 8    Period Weeks    Status New    Target Date 12/17/19      PT LONG TERM GOAL #4   Title Patient ambulates >400' with cane & prostheses modified independent.    Time 8    Period Weeks    Status New    Target Date 12/17/19      PT LONG TERM GOAL #5   Title Patient negotiates ramps, curbs & stairs with single rail with cane & prostheses modified independent.    Time 8    Period Weeks    Status New    Target Date 12/17/19      PT LONG TERM GOAL #6   Title Patient  ambulates 30' with prostheses only around furniture modified independent.    Time 8    Period Weeks    Status New    Target Date 12/17/19              11/30/19 1320  Plan  Clinical Impression Statement Today's skilled session continued to address gait with bil prostheses/cane, strengthening and balance reactions. Frequent rest breaks needed due to fatigue, otherwise no issues reported or noted in session. The pt is  progressing toward goals and should benefit from continued PT to progress toward unmet goals.  Personal Factors and Comorbidities Comorbidity 3+  Comorbidities HTN, DM2, renal transplant, CKD st IV, calciphylaxis, A-Fib, bil. TTAs, TIA  Examination-Activity Limitations Locomotion Level;Squat;Stairs;Stand;Transfers  Examination-Participation Restrictions Community Activity  Pt will benefit from skilled therapeutic intervention in order to improve on the following deficits Abnormal gait;Cardiopulmonary status limiting activity;Decreased activity tolerance;Decreased balance;Decreased endurance;Decreased mobility;Decreased range of motion;Decreased strength;Impaired flexibility;Postural dysfunction;Prosthetic Dependency  Stability/Clinical Decision Making Evolving/Moderate complexity  Rehab Potential Good  PT Frequency 2x / week  PT Duration 8 weeks  PT Treatment/Interventions ADLs/Self Care Home Management;DME Instruction;Gait training;Stair training;Functional mobility training;Therapeutic activities;Therapeutic exercise;Balance training;Neuromuscular re-education;Patient/family education;Prosthetic Training;Manual techniques;Vestibular  PT Next Visit Plan 10th visit progress note due; Continue gait training with cane, balance and strengthening  Consulted and Agree with Plan of Care Patient         Patient will benefit from skilled therapeutic intervention in order to improve the following deficits and impairments:  Abnormal gait, Cardiopulmonary status limiting activity, Decreased activity tolerance, Decreased balance, Decreased endurance, Decreased mobility, Decreased range of motion, Decreased strength, Impaired flexibility, Postural dysfunction, Prosthetic Dependency  Visit Diagnosis: Other abnormalities of gait and mobility  Unsteadiness on feet  Weakness generalized  Abnormal posture     Problem List Patient Active Problem List   Diagnosis Date Noted  . CKD stage 4 due to  type 2 diabetes mellitus ([place]) 04/29/2019  . AKI (acute kidney injury) ([place]) 04/24/2019  . Hyperglycemia 04/24/2019  . Nausea and vomiting 04/24/2019  . Fever 04/23/2019  . Acute metabolic encephalopathy 95/28/4132  . Stroke ([place]) 10/28/2018  . Middle cerebral artery embolism, left 10/28/2018  . Metabolic acidosis 44/05/270  . Dehydration 05/24/2018  . Pressure injury of skin 05/24/2018  . Dyspnea 05/23/2018  . Acute respiratory failure with hypoxia ([place]) 05/23/2018  . Abnormal cardiovascular stress test 12/29/2013  . Awaiting organ transplant 12/09/2013  . Leg ulcer ([place]) 11/17/2013  . Chronic kidney disease (CKD), stage V ([place]) 11/17/2013  . Diabetes ([place]) 11/17/2013  . Cancer of duodenum ([place]) 11/17/2013  . Dyslipidemia 11/17/2013  . Chronic kidney disease requiring chronic dialysis ([place]) 11/17/2013  . Hypercholesteremia 11/17/2013  . H/O malignant neoplasm 11/17/2013  . H/O: HTN (hypertension) 11/17/2013  . BP (high blood pressure) 11/17/2013  . Hypotension 11/17/2013  . Angiopathy, peripheral ([place]) 11/17/2013  . AF (paroxysmal atrial fibrillation) ([place]) 11/17/2013  . BKA stump complication ([place]) 53/66/4403  . S/P bilateral BKA (below knee amputation) ([place]) 08/03/2013  . S/P BKA (below knee amputation) bilateral ([place]) 07/30/2013  . Open wound of both legs with complication 47/42/5956  . Acute blood loss anemia 07/12/2013  . Syncope 07/09/2013  . Intolerance to CAPD peritoneal dialysis s/p CAPD cath removal 07/09/2013 07/05/2013  . Critical lower limb ischemia 06/30/2013  . Extremity pain 06/30/2013  . Atherosclerosis of native arteries of the extremities with ulceration(440.23) 06/17/2013  . Gait disorder 05/31/2013  . Weakness 05/24/2013  . Depression 04/03/2013  . GERD (gastroesophageal reflux disease) 04/03/2013  .  Atrial fibrillation ([place]) 04/02/2013  . DM (diabetes mellitus) ([place]) 12/28/2012  . Other complications due to renal dialysis device, implant, and graft  05/19/2012  . Diabetes mellitus, type 2 ([place]) 11/13/2011  . Anemia 07/02/2011  . ESRD on hemodialysis [facility]) 05/29/2011    Willow Ora, PTA, [facility] [address], [place], [place] [address] [phone] 12/02/19, 11:18 AM   Name: Paul [name] MRN: [id number] Date of Birth: Jan 02, 1956

## 2019-12-03 ENCOUNTER — Ambulatory Visit: Payer: Managed Care, Other (non HMO)

## 2019-12-08 ENCOUNTER — Ambulatory Visit: Payer: Managed Care, Other (non HMO) | Admitting: Physical Therapy

## 2019-12-08 ENCOUNTER — Other Ambulatory Visit: Payer: Self-pay

## 2019-12-08 ENCOUNTER — Encounter: Payer: Self-pay | Admitting: Physical Therapy

## 2019-12-08 DIAGNOSIS — R2689 Other abnormalities of gait and mobility: Secondary | ICD-10-CM

## 2019-12-08 DIAGNOSIS — R2681 Unsteadiness on feet: Secondary | ICD-10-CM

## 2019-12-08 DIAGNOSIS — R293 Abnormal posture: Secondary | ICD-10-CM

## 2019-12-08 DIAGNOSIS — R531 Weakness: Secondary | ICD-10-CM

## 2019-12-09 NOTE — Therapy (Addendum)
[facility] [address] [place], [place], [address] Phone: [phone]   Fax:  [phone]  Physical Therapy Treatment and Progress Note   Patient Details  Name: Paul [name] MRN: [id number] Date of Birth: 1955/06/22 Referring Provider (PT): [name], DO   Encounter Date: 12/08/2019     12/08/19 1333  PT Visits / Re-Eval  Visit Number 10  Number of Visits 25  Date for PT Re-Evaluation 01/04/20  Authorization  Authorization Type [employer]aged [employer] A & B  PT Time Calculation  PT Start Time 1330  PT Stop Time 1405  PT Time Calculation (min) 35 min  PT - End of Session  Equipment Utilized During Treatment Gait belt  Activity Tolerance Patient tolerated treatment well;Patient limited by fatigue (limited by light-headedness)  Behavior During Therapy [facility] for tasks assessed/performed    Past Medical History:  Diagnosis Date  . A-fib ([place])   . Anemia    esrd  . Calciphylaxis 05/2013   lower ext  . Cancer ([place])    hx duodenal adenoCa 2002, resected  . Closed left arm fracture 1967  . Corneal abrasion, left   . Depression   . Diabetes mellitus    Type 1 iddm x 11 yrs  . Diabetic neuropathy ([place])   . ESRD on hemodialysis ([place])    Pt has ESRD due to DM.  He had a L upper arm AVF prior to starting HD which was ligated due to L arm steal syndrome.  He started HD in Jan 2014 and did home HD.  The family couldn't cannulate the R arm AVF successfully so by mid 2014 they decided to switch to PD which was done late summer 2014.  In Jan 2015 he was admitted with FTT felt to be due to underdialysis on PD and PD was abandoned and he   . GERD (gastroesophageal reflux disease)   . Glomerulosclerosis, diabetic ([place])   . Heart murmur   . History of blood transfusion   . Hypertension    off of meds due to orthostatic hypotension  . Kidney transplant recipient   . Open wound of both legs with complication    Pt has had  progressive wounds of both LE's including gangrene of the toes and patchy necrosis of the calves.  He has been treated at [facility] by Dr Jerline Pain with hyperbaric O2 5d per week.  He is getting Na Thiosulfate with HD for suspected calciphylaxis. Pt says doctor's aren't sure if these ulcers were diabetic ulcers or calciphylaxis.  He underwent bilat BKA on 07/30/13.  He says that the woun    Past Surgical History:  Procedure Laterality Date  . AMPUTATION Bilateral 07/30/2013   Procedure: AMPUTATION BELOW KNEE;  Surgeon: Newt Minion, MD;  Location: [facility];  Service: Orthopedics;  Laterality: Bilateral;  Bilateral Below Knee Amputations  . AMPUTATION Bilateral 09/03/2013   Procedure: AMPUTATION BELOW KNEE;  Surgeon: Newt Minion, MD;  Location: [facility];  Service: Orthopedics;  Laterality: Bilateral;  Bilateral Below Knee Amputation Revision  . AV FISTULA PLACEMENT  06/07/2011   Procedure: ARTERIOVENOUS (AV) FISTULA CREATION;  Surgeon: Angelia Mould, MD;  Location: [facility];  Service: Vascular;  Laterality: Left;  . AV FISTULA PLACEMENT  06/18/2012   Procedure: ARTERIOVENOUS (AV) FISTULA CREATION;  Surgeon: Mal Misty, MD;  Location: [facility];  Service: Vascular;  Laterality: Right;  [place]  . BILIARY DIVERSION, EXTERNAL  2002  . BONE MARROW BIOPSY  2012  . CAPD INSERTION N/A 01/14/2013   Procedure: LAPAROSCOPIC INSERTION CONTINUOUS AMBULATORY PERITONEAL DIALYSIS  (CAPD) CATHETER;  Surgeon: Adin Hector, MD;  Location: [facility];  Service: General;  Laterality: N/A;  . CAPD REMOVAL N/A 07/09/2013   Procedure: CONTINUOUS AMBULATORY PERITONEAL DIALYSIS  (CAPD) CATHETER REMOVAL;  Surgeon: Adin Hector, MD;  Location: [facility];  Service: General;  Laterality: N/A;  . COLONOSCOPY     Hx: of  . INSERTION OF DIALYSIS CATHETER  05/22/2012   Procedure: INSERTION OF DIALYSIS CATHETER;  Surgeon: Mal Misty, MD;  Location: [facility];  Service: Vascular;  Laterality: N/A;  right internal jugular vein  . IR ANGIO  INTRA EXTRACRAN SEL COM CAROTID INNOMINATE BILAT MOD SED  10/28/2018  . IR ANGIO VERTEBRAL SEL SUBCLAVIAN INNOMINATE UNI R MOD SED  10/28/2018  . IR ANGIO VERTEBRAL SEL VERTEBRAL UNI L MOD SED  10/28/2018  . LIGATION OF ARTERIOVENOUS  FISTULA  05/22/2012   Procedure: LIGATION OF ARTERIOVENOUS  FISTULA;  Surgeon: Mal Misty, MD;  Location: [facility];  Service: Vascular;  Laterality: Left;  Left brachio-cephalic fistula  . LOWER EXTREMITY ANGIOGRAM N/A 06/14/2013   Procedure: LOWER EXTREMITY ANGIOGRAM;  Surgeon: Angelia Mould, MD;  Location: [facility] C[facility] LAB;  Service: Cardiovascular;  Laterality: N/A;  . NEPHRECTOMY TRANSPLANTED ORGAN    . OTHER SURGICAL HISTORY     Cyst removed from back  . PORTACATH PLACEMENT  2002  . RADIOLOGY WITH ANESTHESIA N/A 10/28/2018   Procedure: IR WITH ANESTHESIA;  Surgeon: Radiologist, Medication, MD;  Location: [facility];  Service: Radiology;  Laterality: N/A;  . REMOVAL OF A DIALYSIS CATHETER    . RENAL BIOPSY, PERCUTANEOUS  2012  . TONSILLECTOMY    . VASCULAR SURGERY    . WHIPPLE PROCEDURE  2002   duodenal ca, Dr. Harlow Asa    There were no vitals filed for this visit.     12/08/19 1333  Symptoms/Limitations  Subjective No falls. Does report having an skin issue on right limb.  Pertinent History HTN, DM2, renal transplant, CKD st IV, calciphylaxis, A-Fib, bil. TTAs, TIA  Patient Stated Goals He would like to get off walker completely. Get close to prior level with prostheses which means no device in home & cane in community.  Pain Assessment  Currently in Pain? No/denies  Pain Score 0      12/08/19 1334  Transfers  Transfers Sit to Stand;Stand to Sit  Sit to Stand 4: Min guard;With upper extremity assist;From chair/3-in-1;From bed  Stand to Sit 4: Min guard;With upper extremity assist;To bed;To chair/3-in-1  Ambulation/Gait  Ambulation/Gait Yes  Ambulation/Gait Assistance 4: Min guard;4: Min assist  Ambulation/Gait Assistance Details min guard to  supervision with RW/prostheses. Short distance gait with cane in session with pt reporting increased pain/discomfort at wound site. Change back to RW for remainder of session due to this. up to min assist needed for balance with cane/prostheses.   Ambulation Distance (Feet) 60 Feet (x1 with cane)  Assistive device Straight cane;Prostheses  Gait Pattern Step-through pattern;Decreased stride length;Trunk flexed;Wide base of support  Ambulation Surface Level;Indoor  Exercises  Exercises Other Exercises  Other Exercises  seated on stool in parallel bars: posterior curl ups for 10 reps, lateral lean with reaching down alternating sides for 10 reps each side; with green theraband bil UE's punching for 10 reps, then shoulder horizontal abduction for 10 reps. cues needed on form and technique.   Knee/Hip Exercises: Aerobic  Stepper Bil UE/LE's at level  2.5 for 5 minutes, rest for 3 minutes, then another 5 minutes of work. RPM >/= 40 for strengthening and activity tolerance.  Prosthetics  Current prosthetic wear tolerance (days/week)  daily  Current prosthetic wear tolerance (#hours/day)  most of awake hours  Residual limb condition  right limb: blister at end of limb 1.5 cm width, 1.7 in length. closed and dry to touch. appears the blister formed over a callous that was already there. no other issues. left limb intact with some callousing at the end of limb.   Education Provided Residual limb care  Person(s) Educated Patient  Education Method Explanation;Demonstration;Verbal cues  Education Method Verbalized understanding;Returned demonstration;Verbal cues required;Needs further instruction         PT Short Term Goals - 11/26/19 1343      PT SHORT TERM GOAL #1   Title Patient demonstrates & verbalizes understanding of initial HEP. (All STGs Target Date 11/19/2019)    Baseline MET 11/17/2019    Time 4   [place] twice so STGs set for 4 weeks of care   Status Achieved    Target  Date 11/19/19      PT SHORT TERM GOAL #2   Title Berg Balance >/= 36/56    Baseline 11/23/19 Berg Balance= 27/56 (inc. of 1 pt.)    Time 4    Period Weeks    Status Partially Met    Target Date 11/19/19      PT SHORT TERM GOAL #3   Title Dynamic Gait Index with cane >/= 12/24 with supervision    Time 4    Period Weeks    Status Deferred    Target Date 11/19/19      PT SHORT TERM GOAL #4   Title Patient ambulates 200' with cane & prostheses with supervision.    Baseline MET 11/17/2019    Time 4    Period Weeks    Status Achieved    Target Date 11/19/19      PT SHORT TERM GOAL #5   Title Patient negotiates ramps, curbs & stairs single rail with cane & prostheses with minA.    Baseline 11/26/19: Patient negotiates ramps & stairs single rail with cane & prostheses with min A. curb not yet assessed.    Time 4    Period Weeks    Status Partially Met    Target Date 11/19/19             PT Long Term Goals - 10/06/19 1438      PT LONG TERM GOAL #1   Title Patient verbalizes & demonstrates understanding of ongoing fitness plan including home exercises & fitness center exercise program.  (All LTGs Target Date: 12/17/2019)    Time 8    Period Weeks    Status New    Target Date 12/17/19      PT LONG TERM GOAL #2   Title Berg >/= 45/56    Time 8    Period Weeks    Status New    Target Date 12/17/19      PT LONG TERM GOAL #3   Title Dynamic Gait Index with cane >/= 19/24 to indicate lower fall risk.    Time 8    Period Weeks    Status New    Target Date 12/17/19      PT LONG TERM GOAL #4   Title Patient ambulates >400' with cane & prostheses modified independent.    Time 8  Period Weeks    Status New    Target Date 12/17/19      PT LONG TERM GOAL #5   Title Patient negotiates ramps, curbs & stairs with single rail with cane & prostheses modified independent.    Time 8    Period Weeks    Status New    Target Date 12/17/19      PT LONG TERM GOAL #6   Title  Patient ambulates 69' with prostheses only around furniture modified independent.    Time 8    Period Weeks    Status New    Target Date 12/17/19           Progress Note Reporting Period 10/06/19 to 12/08/19  See note below for Objective Data and Assessment of Progress/Goals.   Progress note entered by:  Cameron Sprang, PT, MPT [facility] [address] [place], [place], [address] Phone: [phone]   Fax:  [phone] 12/15/19, 8:11 AM       12/08/19 1334  Plan  Clinical Impression Statement Today's skilled session cotinued to focus on strengthening and gait with cane. Gait with straight cane was limited due to limb pain/discomfort. Otherwise no other issues noted or reported in session. The pt is progressing and should benefit from continued PT to progress toward unmet goals.  Personal Factors and Comorbidities Comorbidity 3+  Comorbidities HTN, DM2, renal transplant, CKD st IV, calciphylaxis, A-Fib, bil. TTAs, TIA  Examination-Activity Limitations Locomotion Level;Squat;Stairs;Stand;Transfers  Examination-Participation Restrictions Community Activity  Pt will benefit from skilled therapeutic intervention in order to improve on the following deficits Abnormal gait;Cardiopulmonary status limiting activity;Decreased activity tolerance;Decreased balance;Decreased endurance;Decreased mobility;Decreased range of motion;Decreased strength;Impaired flexibility;Postural dysfunction;Prosthetic Dependency  Stability/Clinical Decision Making Evolving/Moderate complexity  Rehab Potential Good  PT Frequency 2x / week  PT Duration 8 weeks  PT Treatment/Interventions ADLs/Self Care Home Management;DME Instruction;Gait training;Stair training;Functional mobility training;Therapeutic activities;Therapeutic exercise;Balance training;Neuromuscular re-education;Patient/family education;Prosthetic Training;Manual techniques;Vestibular  PT Next Visit Plan  Continue gait training with cane, balance and strengthening  Consulted and Agree with Plan of Care Patient      Patient will benefit from skilled therapeutic intervention in order to improve the following deficits and impairments:  Abnormal gait, Cardiopulmonary status limiting activity, Decreased activity tolerance, Decreased balance, Decreased endurance, Decreased mobility, Decreased range of motion, Decreased strength, Impaired flexibility, Postural dysfunction, Prosthetic Dependency  Visit Diagnosis: Other abnormalities of gait and mobility  Unsteadiness on feet  Weakness generalized  Abnormal posture     Problem List Patient Active Problem List   Diagnosis Date Noted  . CKD stage 4 due to type 2 diabetes mellitus ([place]) 04/29/2019  . AKI (acute kidney injury) ([place]) 04/24/2019  . Hyperglycemia 04/24/2019  . Nausea and vomiting 04/24/2019  . Fever 04/23/2019  . Acute metabolic encephalopathy 77/41/2878  . Stroke ([place]) 10/28/2018  . Middle cerebral artery embolism, left 10/28/2018  . Metabolic acidosis 67/67/2094  . Dehydration 05/24/2018  . Pressure injury of skin 05/24/2018  . Dyspnea 05/23/2018  . Acute respiratory failure with hypoxia ([place]) 05/23/2018  . Abnormal cardiovascular stress test 12/29/2013  . Awaiting organ transplant 12/09/2013  . Leg ulcer ([place]) 11/17/2013  . Chronic kidney disease (CKD), stage V ([place]) 11/17/2013  . Diabetes ([place]) 11/17/2013  . Cancer of duodenum ([place]) 11/17/2013  . Dyslipidemia 11/17/2013  . Chronic kidney disease requiring chronic dialysis ([place]) 11/17/2013  . Hypercholesteremia 11/17/2013  . H/O malignant neoplasm 11/17/2013  . H/O: HTN (hypertension) 11/17/2013  . BP (high blood pressure) 11/17/2013  .  Hypotension 11/17/2013  . Angiopathy, peripheral ([place]) 11/17/2013  . AF (paroxysmal atrial fibrillation) ([place]) 11/17/2013  . BKA stump complication ([place]) 76/73/4193  . S/P bilateral BKA (below knee amputation) ([place]) 08/03/2013    . S/P BKA (below knee amputation) bilateral ([place]) 07/30/2013  . Open wound of both legs with complication 79/06/4095  . Acute blood loss anemia 07/12/2013  . Syncope 07/09/2013  . Intolerance to CAPD peritoneal dialysis s/p CAPD cath removal 07/09/2013 07/05/2013  . Critical lower limb ischemia 06/30/2013  . Extremity pain 06/30/2013  . Atherosclerosis of native arteries of the extremities with ulceration(440.23) 06/17/2013  . Gait disorder 05/31/2013  . Weakness 05/24/2013  . Depression 04/03/2013  . GERD (gastroesophageal reflux disease) 04/03/2013  . Atrial fibrillation ([place]) 04/02/2013  . DM (diabetes mellitus) ([place]) 12/28/2012  . Other complications due to renal dialysis device, implant, and graft 05/19/2012  . Diabetes mellitus, type 2 ([place]) 11/13/2011  . Anemia 07/02/2011  . ESRD on hemodialysis [facility]) 05/29/2011    Willow Ora, PTA, [facility] [address], [place], [place] [address] [phone] 12/09/19, 10:59 PM   Name: Paul [name] MRN: [id number] Date of Birth: May 06, 1956

## 2019-12-10 ENCOUNTER — Ambulatory Visit: Payer: Managed Care, Other (non HMO) | Admitting: Physical Therapy

## 2019-12-15 ENCOUNTER — Encounter: Payer: Self-pay | Admitting: Rehabilitation

## 2019-12-15 ENCOUNTER — Other Ambulatory Visit: Payer: Self-pay

## 2019-12-15 ENCOUNTER — Ambulatory Visit: Payer: Managed Care, Other (non HMO) | Admitting: Rehabilitation

## 2019-12-15 DIAGNOSIS — R531 Weakness: Secondary | ICD-10-CM

## 2019-12-15 DIAGNOSIS — R293 Abnormal posture: Secondary | ICD-10-CM

## 2019-12-15 DIAGNOSIS — R2681 Unsteadiness on feet: Secondary | ICD-10-CM

## 2019-12-15 DIAGNOSIS — R2689 Other abnormalities of gait and mobility: Secondary | ICD-10-CM | POA: Diagnosis not present

## 2019-12-15 NOTE — Therapy (Signed)
[facility] [address] [place], [place], [address] Phone: [phone]   Fax:  [phone]  Physical Therapy Treatment  Patient Details  Name: Paul [name] MRN: [id number] Date of Birth: 07/23/1955 Referring Provider (PT): [name], DO   Encounter Date: 12/15/2019   PT End of Session - 12/15/19 1319    Visit Number 11    Number of Visits 25    Date for PT Re-Evaluation 01/04/20    Authorization Type [employer]aged Medicare A & B    Progress Note Due on Visit 20    PT Start Time 1316    PT Stop Time 1400    PT Time Calculation (min) 44 min    Equipment Utilized During Treatment Gait belt    Activity Tolerance Patient tolerated treatment well;Patient limited by fatigue   limited by light-headedness   Behavior During Therapy [facility] for tasks assessed/performed           Past Medical History:  Diagnosis Date  . A-fib ([place])   . Anemia    esrd  . Calciphylaxis 05/2013   lower ext  . Cancer ([place])    hx duodenal adenoCa 2002, resected  . Closed left arm fracture 1967  . Corneal abrasion, left   . Depression   . Diabetes mellitus    Type 1 iddm x 11 yrs  . Diabetic neuropathy ([place])   . ESRD on hemodialysis ([place])    Pt has ESRD due to DM.  He had a L upper arm AVF prior to starting HD which was ligated due to L arm steal syndrome.  He started HD in Jan 2014 and did home HD.  The family couldn't cannulate the R arm AVF successfully so by mid 2014 they decided to switch to PD which was done late summer 2014.  In Jan 2015 he was admitted with FTT felt to be due to underdialysis on PD and PD was abandoned and he   . GERD (gastroesophageal reflux disease)   . Glomerulosclerosis, diabetic ([place])   . Heart murmur   . History of blood transfusion   . Hypertension    off of meds due to orthostatic hypotension  . Kidney transplant recipient   . Open wound of both legs with complication    Pt has had progressive wounds of  both LE's including gangrene of the toes and patchy necrosis of the calves.  He has been treated at [facility] by Dr Jerline Pain with hyperbaric O2 5d per week.  He is getting Na Thiosulfate with HD for suspected calciphylaxis. Pt says doctor's aren't sure if these ulcers were diabetic ulcers or calciphylaxis.  He underwent bilat BKA on 07/30/13.  He says that the woun    Past Surgical History:  Procedure Laterality Date  . AMPUTATION Bilateral 07/30/2013   Procedure: AMPUTATION BELOW KNEE;  Surgeon: Newt Minion, MD;  Location: [facility];  Service: Orthopedics;  Laterality: Bilateral;  Bilateral Below Knee Amputations  . AMPUTATION Bilateral 09/03/2013   Procedure: AMPUTATION BELOW KNEE;  Surgeon: Newt Minion, MD;  Location: [facility];  Service: Orthopedics;  Laterality: Bilateral;  Bilateral Below Knee Amputation Revision  . AV FISTULA PLACEMENT  06/07/2011   Procedure: ARTERIOVENOUS (AV) FISTULA CREATION;  Surgeon: Angelia Mould, MD;  Location: [facility];  Service: Vascular;  Laterality: Left;  . AV FISTULA PLACEMENT  06/18/2012   Procedure: ARTERIOVENOUS (AV) FISTULA CREATION;  Surgeon: Mal Misty, MD;  Location: [facility];  Service:  Vascular;  Laterality: Right;  CIMINO  . BILIARY DIVERSION, EXTERNAL  2002  . BONE MARROW BIOPSY  2012  . CAPD INSERTION N/A 01/14/2013   Procedure: LAPAROSCOPIC INSERTION CONTINUOUS AMBULATORY PERITONEAL DIALYSIS  (CAPD) CATHETER;  Surgeon: Adin Hector, MD;  Location: [facility];  Service: General;  Laterality: N/A;  . CAPD REMOVAL N/A 07/09/2013   Procedure: CONTINUOUS AMBULATORY PERITONEAL DIALYSIS  (CAPD) CATHETER REMOVAL;  Surgeon: Adin Hector, MD;  Location: [place] [facility];  Service: General;  Laterality: N/A;  . COLONOSCOPY     Hx: of  . INSERTION OF DIALYSIS CATHETER  05/22/2012   Procedure: INSERTION OF DIALYSIS CATHETER;  Surgeon: Mal Misty, MD;  Location: [facility];  Service: Vascular;  Laterality: N/A;  right internal jugular vein  . IR ANGIO INTRA EXTRACRAN SEL COM  CAROTID INNOMINATE BILAT MOD SED  10/28/2018  . IR ANGIO VERTEBRAL SEL SUBCLAVIAN INNOMINATE UNI R MOD SED  10/28/2018  . IR ANGIO VERTEBRAL SEL VERTEBRAL UNI L MOD SED  10/28/2018  . LIGATION OF ARTERIOVENOUS  FISTULA  05/22/2012   Procedure: LIGATION OF ARTERIOVENOUS  FISTULA;  Surgeon: Mal Misty, MD;  Location: [facility];  Service: Vascular;  Laterality: Left;  Left brachio-cephalic fistula  . LOWER EXTREMITY ANGIOGRAM N/A 06/14/2013   Procedure: LOWER EXTREMITY ANGIOGRAM;  Surgeon: Angelia Mould, MD;  Location: [facility] CATH LAB;  Service: Cardiovascular;  Laterality: N/A;  . NEPHRECTOMY TRANSPLANTED ORGAN    . OTHER SURGICAL HISTORY     Cyst removed from back  . PORTACATH PLACEMENT  2002  . RADIOLOGY WITH ANESTHESIA N/A 10/28/2018   Procedure: IR WITH ANESTHESIA;  Surgeon: Radiologist, Medication, MD;  Location: [facility];  Service: Radiology;  Laterality: N/A;  . REMOVAL OF A DIALYSIS CATHETER    . RENAL BIOPSY, PERCUTANEOUS  2012  . TONSILLECTOMY    . VASCULAR SURGERY    . WHIPPLE PROCEDURE  2002   duodenal ca, Dr. Harlow Asa    There were no vitals filed for this visit.   Subjective Assessment - 12/15/19 1318    Subjective Pt reports no changes, had a good vacation at [facility].    Pertinent History HTN, DM2, renal transplant, CKD st IV, calciphylaxis, A-Fib, bil. TTAs, TIA    Patient Stated Goals He would like to get off walker completely. Get close to prior level with prostheses which means no device in home & cane in community.    Currently in Pain? No/denies                             [facility] Adult PT Treatment/Exercise - 12/15/19 1324      Transfers   Transfers Sit to Stand;Stand to Sit    Sit to Stand 5: Supervision    Sit to Stand Details Verbal cues for precautions/safety    Stand to Sit 4: Min guard;With upper extremity assist;To bed;To chair/3-in-1      Ambulation/Gait   Ambulation/Gait Yes    Ambulation/Gait Assistance 4: Min guard     Ambulation/Gait Assistance Details Continues to require at least min/guard with use of quad tip cane.  Pt continues to be somewhat unsteady with cane at times, esp when making turns.  Cues for upright posture and more purposeful placement of cane when stepping.     Assistive device Straight cane;Prostheses   with quad tip    Gait Pattern Step-through pattern;Decreased stride length;Trunk flexed;Wide base of support    Ambulation  Surface Level;Indoor      High Level Balance   High Level Balance Comments Modified SLS with single prosthesis on floor and opposite on 6" step alternating UE flexion for single UE support only x 10 reps (switched legs and repeated x 10 reps), same modified SLS with BUE on 2lb while elevating into flexion and back to 90 deg x 5 reps when on LLE but 10 reps on RLE.  Cues for more upright posture and posterior weight shift onto stance leg.       Exercises   Exercises Other Exercises    Other Exercises  In // bars for UE support and LE strengthening forward step ups to 6" step x 10 reps, side step ups x 10 reps with cues for improved thigh and hip activation and upright posture.        Knee/Hip Exercises: Aerobic   Stepper Seated stepper x 5 mins with BUE/LEs at level 2 resistance attempting to maintain rpms in 50's which was somewhat diffcult due to fatigue.                      PT Short Term Goals - 11/26/19 1343      PT SHORT TERM GOAL #1   Title Patient demonstrates & verbalizes understanding of initial HEP. (All STGs Target Date 11/19/2019)    Baseline MET 11/17/2019    Time 4   [place] his [place] twice so STGs set for 4 weeks of care   Status Achieved    Target Date 11/19/19      PT SHORT TERM GOAL #2   Title Berg Balance >/= 36/56    Baseline 11/23/19 Berg Balance= 27/56 (inc. of 1 pt.)    Time 4    Period Weeks    Status Partially Met    Target Date 11/19/19      PT SHORT TERM GOAL #3   Title Dynamic Gait Index with cane >/= 12/24 with  supervision    Time 4    Period Weeks    Status Deferred    Target Date 11/19/19      PT SHORT TERM GOAL #4   Title Patient ambulates 200' with cane & prostheses with supervision.    Baseline MET 11/17/2019    Time 4    Period Weeks    Status Achieved    Target Date 11/19/19      PT SHORT TERM GOAL #5   Title Patient negotiates ramps, curbs & stairs single rail with cane & prostheses with minA.    Baseline 11/26/19: Patient negotiates ramps & stairs single rail with cane & prostheses with min A. curb not yet assessed.    Time 4    Period Weeks    Status Partially Met    Target Date 11/19/19             PT Long Term Goals - 10/06/19 1438      PT LONG TERM GOAL #1   Title Patient verbalizes & demonstrates understanding of ongoing fitness plan including home exercises & fitness center exercise program.  (All LTGs Target Date: 12/17/2019)    Time 8    Period Weeks    Status New    Target Date 12/17/19      PT LONG TERM GOAL #2   Title Berg >/= 45/56    Time 8    Period Weeks    Status New    Target Date 12/17/19  PT LONG TERM GOAL #3   Title Dynamic Gait Index with cane >/= 19/24 to indicate lower fall risk.    Time 8    Period Weeks    Status New    Target Date 12/17/19      PT LONG TERM GOAL #4   Title Patient ambulates >400' with cane & prostheses modified independent.    Time 8    Period Weeks    Status New    Target Date 12/17/19      PT LONG TERM GOAL #5   Title Patient negotiates ramps, curbs & stairs with single rail with cane & prostheses modified independent.    Time 8    Period Weeks    Status New    Target Date 12/17/19      PT LONG TERM GOAL #6   Title Patient ambulates 14' with prostheses only around furniture modified independent.    Time 8    Period Weeks    Status New    Target Date 12/17/19                 Plan - 12/15/19 1519    Clinical Impression Statement Skilled session focused on standing BLE strengthening as well as  modified SLS for higher level balance challenges.  Pt seemed to tolerate more upright activity today prior to needing seated rest breaks.    Personal Factors and Comorbidities Comorbidity 3+    Comorbidities HTN, DM2, renal transplant, CKD st IV, calciphylaxis, A-Fib, bil. TTAs, TIA    Examination-Activity Limitations Locomotion Level;Squat;Stairs;Stand;Transfers    Examination-Participation Restrictions Community Activity    Stability/Clinical Decision Making Evolving/Moderate complexity    Rehab Potential Good    PT Frequency 2x / week    PT Duration 8 weeks    PT Treatment/Interventions ADLs/Self Care Home Management;DME Instruction;Gait training;Stair training;Functional mobility training;Therapeutic activities;Therapeutic exercise;Balance training;Neuromuscular re-education;Patient/family education;Prosthetic Training;Manual techniques;Vestibular    PT Next Visit Plan Continue gait training with cane, balance and strengthening, endurance    Consulted and Agree with Plan of Care Patient           Patient will benefit from skilled therapeutic intervention in order to improve the following deficits and impairments:  Abnormal gait, Cardiopulmonary status limiting activity, Decreased activity tolerance, Decreased balance, Decreased endurance, Decreased mobility, Decreased range of motion, Decreased strength, Impaired flexibility, Postural dysfunction, Prosthetic Dependency  Visit Diagnosis: Other abnormalities of gait and mobility  Unsteadiness on feet  Weakness generalized  Abnormal posture     Problem List Patient Active Problem List   Diagnosis Date Noted  . CKD stage 4 due to type 2 diabetes mellitus ([place]) 04/29/2019  . AKI (acute kidney injury) ([place]) 04/24/2019  . Hyperglycemia 04/24/2019  . Nausea and vomiting 04/24/2019  . Fever 04/23/2019  . Acute metabolic encephalopathy 66/10/3014  . Stroke ([place]) 10/28/2018  . Middle cerebral artery embolism, left 10/28/2018  .  Metabolic acidosis 05/28/3233  . Dehydration 05/24/2018  . Pressure injury of skin 05/24/2018  . Dyspnea 05/23/2018  . Acute respiratory failure with hypoxia ([place]) 05/23/2018  . Abnormal cardiovascular stress test 12/29/2013  . Awaiting organ transplant 12/09/2013  . Leg ulcer ([place]) 11/17/2013  . Chronic kidney disease (CKD), stage V ([place]) 11/17/2013  . Diabetes ([place]) 11/17/2013  . Cancer of duodenum ([place]) 11/17/2013  . Dyslipidemia 11/17/2013  . Chronic kidney disease requiring chronic dialysis ([place]) 11/17/2013  . Hypercholesteremia 11/17/2013  . H/O malignant neoplasm 11/17/2013  . H/O: HTN (hypertension) 11/17/2013  . BP (high blood  pressure) 11/17/2013  . Hypotension 11/17/2013  . Angiopathy, peripheral ([place]) 11/17/2013  . AF (paroxysmal atrial fibrillation) ([place]) 11/17/2013  . BKA stump complication ([place]) 38/32/9191  . S/P bilateral BKA (below knee amputation) ([place]) 08/03/2013  . S/P BKA (below knee amputation) bilateral ([place]) 07/30/2013  . Open wound of both legs with complication 66/10/43  . Acute blood loss anemia 07/12/2013  . Syncope 07/09/2013  . Intolerance to CAPD peritoneal dialysis s/p CAPD cath removal 07/09/2013 07/05/2013  . Critical lower limb ischemia 06/30/2013  . Extremity pain 06/30/2013  . Atherosclerosis of native arteries of the extremities with ulceration(440.23) 06/17/2013  . Gait disorder 05/31/2013  . Weakness 05/24/2013  . Depression 04/03/2013  . GERD (gastroesophageal reflux disease) 04/03/2013  . Atrial fibrillation ([place]) 04/02/2013  . DM (diabetes mellitus) ([place]) 12/28/2012  . Other complications due to renal dialysis device, implant, and graft 05/19/2012  . Diabetes mellitus, type 2 ([place]) 11/13/2011  . Anemia 07/02/2011  . ESRD on hemodialysis ([place]) 05/29/2011    Cameron Sprang, PT, MPT [facility] [address] [place], [place], [address] Phone: [phone]   Fax:  [phone] 12/15/19, 3:21  PM  Name: Paul [name] MRN: [id number] Date of Birth: [birth year]

## 2019-12-17 ENCOUNTER — Ambulatory Visit: Payer: Managed Care, Other (non HMO) | Admitting: Physical Therapy

## 2019-12-22 ENCOUNTER — Ambulatory Visit: Payer: Medicare Other | Attending: Family Medicine

## 2019-12-22 ENCOUNTER — Other Ambulatory Visit: Payer: Self-pay

## 2019-12-22 DIAGNOSIS — R293 Abnormal posture: Secondary | ICD-10-CM | POA: Diagnosis present

## 2019-12-22 DIAGNOSIS — R2681 Unsteadiness on feet: Secondary | ICD-10-CM | POA: Insufficient documentation

## 2019-12-22 DIAGNOSIS — M6281 Muscle weakness (generalized): Secondary | ICD-10-CM | POA: Diagnosis present

## 2019-12-22 DIAGNOSIS — R262 Difficulty in walking, not elsewhere classified: Secondary | ICD-10-CM | POA: Insufficient documentation

## 2019-12-22 DIAGNOSIS — R2689 Other abnormalities of gait and mobility: Secondary | ICD-10-CM | POA: Diagnosis not present

## 2019-12-22 NOTE — Therapy (Signed)
[facility] [address] [place], [place], [address] Phone: [phone]   Fax:  [phone]  Physical Therapy Treatment  Patient Details  Name: Paul [name] MRN: [id number] Date of Birth: Mar 26, 1956 Referring Provider (PT): [name], DO   Encounter Date: 12/22/2019   PT End of Session - 12/22/19 1324    Visit Number 12    Number of Visits 25    Date for PT Re-Evaluation 01/04/20    Authorization Type [employer] Managed Medicare A & B    Progress Note Due on Visit 20    PT Start Time 1322    PT Stop Time 1400    PT Time Calculation (min) 38 min    Equipment Utilized During Treatment Gait belt    Activity Tolerance Patient tolerated treatment well;Patient limited by fatigue   limited by light-headedness   Behavior During Therapy [facility] for tasks assessed/performed           Past Medical History:  Diagnosis Date  . A-fib ([place])   . Anemia    esrd  . Calciphylaxis 05/2013   lower ext  . Cancer ([place])    hx duodenal adenoCa 2002, resected  . Closed left arm fracture 1967  . Corneal abrasion, left   . Depression   . Diabetes mellitus    Type 1 iddm x 11 yrs  . Diabetic neuropathy ([place])   . ESRD on hemodialysis ([place])    Pt has ESRD due to DM.  He had a L upper arm AVF prior to starting HD which was ligated due to L arm steal syndrome.  He started HD in Jan 2014 and did home HD.  The family couldn't cannulate the R arm AVF successfully so by mid 2014 they decided to switch to PD which was done late summer 2014.  In Jan 2015 he was admitted with FTT felt to be due to underdialysis on PD and PD was abandoned and he   . GERD (gastroesophageal reflux disease)   . Glomerulosclerosis, diabetic ([place])   . Heart murmur   . History of blood transfusion   . Hypertension    off of meds due to orthostatic hypotension  . Kidney transplant recipient   . Open wound of both legs with complication    Pt has had progressive wounds of  both LE's including gangrene of the toes and patchy necrosis of the calves.  He has been treated at [facility] by Dr Jerline Pain with hyperbaric O2 5d per week.  He is getting Na Thiosulfate with HD for suspected calciphylaxis. Pt says doctor's aren't sure if these ulcers were diabetic ulcers or calciphylaxis.  He underwent bilat BKA on 07/30/13.  He says that the woun    Past Surgical History:  Procedure Laterality Date  . AMPUTATION Bilateral 07/30/2013   Procedure: AMPUTATION BELOW KNEE;  Surgeon: Newt Minion, MD;  Location: [facility];  Service: Orthopedics;  Laterality: Bilateral;  Bilateral Below Knee Amputations  . AMPUTATION Bilateral 09/03/2013   Procedure: AMPUTATION BELOW KNEE;  Surgeon: Newt Minion, MD;  Location: [place] [facility];  Service: Orthopedics;  Laterality: Bilateral;  Bilateral Below Knee Amputation Revision  . AV FISTULA PLACEMENT  06/07/2011   Procedure: ARTERIOVENOUS (AV) FISTULA CREATION;  Surgeon: Angelia Mould, MD;  Location: [facility];  Service: Vascular;  Laterality: Left;  . AV FISTULA PLACEMENT  06/18/2012   Procedure: ARTERIOVENOUS (AV) FISTULA CREATION;  Surgeon: Mal Misty, MD;  Location: [facility];  Service:  Vascular;  Laterality: Right;  CIMINO  . BILIARY DIVERSION, EXTERNAL  2002  . BONE MARROW BIOPSY  2012  . CAPD INSERTION N/A 01/14/2013   Procedure: LAPAROSCOPIC INSERTION CONTINUOUS AMBULATORY PERITONEAL DIALYSIS  (CAPD) CATHETER;  Surgeon: Adin Hector, MD;  Location: [facility];  Service: General;  Laterality: N/A;  . CAPD REMOVAL N/A 07/09/2013   Procedure: CONTINUOUS AMBULATORY PERITONEAL DIALYSIS  (CAPD) CATHETER REMOVAL;  Surgeon: Adin Hector, MD;  Location: [facility];  Service: General;  Laterality: N/A;  . COLONOSCOPY     Hx: of  . INSERTION OF DIALYSIS CATHETER  05/22/2012   Procedure: INSERTION OF DIALYSIS CATHETER;  Surgeon: Mal Misty, MD;  Location: [facility];  Service: Vascular;  Laterality: N/A;  right internal jugular vein  . IR ANGIO INTRA EXTRACRAN SEL COM  CAROTID INNOMINATE BILAT MOD SED  10/28/2018  . IR ANGIO VERTEBRAL SEL SUBCLAVIAN INNOMINATE UNI R MOD SED  10/28/2018  . IR ANGIO VERTEBRAL SEL VERTEBRAL UNI L MOD SED  10/28/2018  . LIGATION OF ARTERIOVENOUS  FISTULA  05/22/2012   Procedure: LIGATION OF ARTERIOVENOUS  FISTULA;  Surgeon: Mal Misty, MD;  Location: [facility];  Service: Vascular;  Laterality: Left;  Left brachio-cephalic fistula  . LOWER EXTREMITY ANGIOGRAM N/A 06/14/2013   Procedure: LOWER EXTREMITY ANGIOGRAM;  Surgeon: Angelia Mould, MD;  Location: [facility] CATH LAB;  Service: Cardiovascular;  Laterality: N/A;  . NEPHRECTOMY TRANSPLANTED ORGAN    . OTHER SURGICAL HISTORY     Cyst removed from back  . PORTACATH PLACEMENT  2002  . RADIOLOGY WITH ANESTHESIA N/A 10/28/2018   Procedure: IR WITH ANESTHESIA;  Surgeon: Radiologist, Medication, MD;  Location: [place];  Service: Radiology;  Laterality: N/A;  . REMOVAL OF A DIALYSIS CATHETER    . RENAL BIOPSY, PERCUTANEOUS  2012  . TONSILLECTOMY    . VASCULAR SURGERY    . WHIPPLE PROCEDURE  2002   duodenal ca, Dr. Harlow Asa    There were no vitals filed for this visit.   Subjective Assessment - 12/22/19 1325    Subjective Pt reports that he is having some left sided low back pain that feels muscular that started a couple days ago. He is also having some discomfort at distal residual limbs. Callous noted on right laterally. Pt reports that his exercises go ok when he does them. Finds finding time is difficult with working.    Pertinent History HTN, DM2, renal transplant, CKD st IV, calciphylaxis, A-Fib, bil. TTAs, TIA    Patient Stated Goals He would like to get off walker completely. Get close to prior level with prostheses which means no device in home & cane in community.    Currently in Pain? Yes    Pain Score 3     Pain Location Back    Pain Orientation Left    Pain Descriptors / Indicators Aching    Pain Type Acute pain                             [facility] Adult  PT Treatment/Exercise - 12/22/19 1329      Transfers   Transfers Sit to Stand;Stand to Sit    Sit to Stand 5: Supervision    Sit to Stand Details Verbal cues for technique    Stand to Sit 5: Supervision      Ambulation/Gait   Ambulation/Gait Yes    Ambulation/Gait Assistance 4: Min guard;4: Min assist  Ambulation/Gait Assistance Details PT noted left foot swinging in during swing phase almost hitting right foot before coming back out upon landing. Pt reported right leg felt like it was moving in socket. Added 5 ply sock prior to second bout to right as was only ply pt had. Pt reported feeling better with sock. Advised to bring multiple ply socks to allow smaller adjustments if needed. Still had medial swing on left during swing phase so may be more hip weakness. PT provided tactile cues for erect posture.    Ambulation Distance (Feet) 115 Feet   200' x 1   Assistive device Straight cane;Prostheses   quad tip   Gait Pattern Step-through pattern;Decreased step length - right;Decreased step length - left    Ambulation Surface Level;Indoor      Standardized Balance Assessment   Standardized Balance Assessment Berg Balance Test      Berg Balance Test   Sit to Stand Able to stand  independently using hands    Standing Unsupported Able to stand safely 2 minutes    Sitting with Back Unsupported but Feet Supported on Floor or Stool Able to sit safely and securely 2 minutes    Stand to Sit Sits safely with minimal use of hands    Transfers Needs one person to assist    Standing Unsupported with Eyes Closed Able to stand 10 seconds with supervision    Standing Ubsupported with Feet Together Needs help to attain position but able to stand for 30 seconds with feet together    From Standing, Reach Forward with Outstretched Arm Can reach confidently >25 cm (10")    From Standing Position, Pick up Object from Floor Able to pick up shoe, needs supervision    From Standing Position, Turn to Look Behind  Over each Shoulder Turn sideways only but maintains balance    Turn 360 Degrees Needs assistance while turning    Standing Unsupported, Alternately Place Feet on Step/Stool Able to complete >2 steps/needs minimal assist    Standing Unsupported, One Foot in Front Able to take small step independently and hold 30 seconds    Standing on One Leg Tries to lift leg/unable to hold 3 seconds but remains standing independently    Total Score 33      Prosthetics   Prosthetic Care Comments  PT educated to bring multiple ply socks with him in case he needs to adjust.    Current prosthetic wear tolerance (days/week)  daily    Residual limb condition  noted callous on right distal tibia    Education Provided Ply sock cleaning;Skin check    Person(s) Educated Patient                    PT Short Term Goals - 11/26/19 1343      PT SHORT TERM GOAL #1   Title Patient demonstrates & verbalizes understanding of initial HEP. (All STGs Target Date 11/19/2019)    Baseline MET 11/17/2019    Time 4   [place] so STGs set for 4 weeks of care   Status Achieved    Target Date 11/19/19      PT SHORT TERM GOAL #2   Title Berg Balance >/= 36/56    Baseline 11/23/19 Berg Balance= 27/56 (inc. of 1 pt.)    Time 4    Period Weeks    Status Partially Met    Target Date 11/19/19      PT SHORT TERM  GOAL #3   Title Dynamic Gait Index with cane >/= 12/24 with supervision    Time 4    Period Weeks    Status Deferred    Target Date 11/19/19      PT SHORT TERM GOAL #4   Title Patient ambulates 200' with cane & prostheses with supervision.    Baseline MET 11/17/2019    Time 4    Period Weeks    Status Achieved    Target Date 11/19/19      PT SHORT TERM GOAL #5   Title Patient negotiates ramps, curbs & stairs single rail with cane & prostheses with minA.    Baseline 11/26/19: Patient negotiates ramps & stairs single rail with cane & prostheses with min A. curb not yet assessed.     Time 4    Period Weeks    Status Partially Met    Target Date 11/19/19             PT Long Term Goals - 12/22/19 1343      PT LONG TERM GOAL #1   Title Patient verbalizes & demonstrates understanding of ongoing fitness plan including home exercises & fitness center exercise program.  (All LTGs Target Date: 12/17/2019)    Time 8    Period Weeks    Status New      PT LONG TERM GOAL #2   Title Berg >/= 45/56    Baseline 33/56 on 12/22/19    Time 8    Period Weeks    Status Not Met      PT LONG TERM GOAL #3   Title Dynamic Gait Index with cane >/= 19/24 to indicate lower fall risk.    Time 8    Period Weeks    Status New      PT LONG TERM GOAL #4   Title Patient ambulates >400' with cane & prostheses modified independent.    Baseline 200' with cane min and prostheses min assist.    Time 8    Period Weeks    Status Not Met      PT LONG TERM GOAL #5   Title Patient negotiates ramps, curbs & stairs with single rail with cane & prostheses modified independent.    Time 8    Period Weeks    Status New      PT LONG TERM GOAL #6   Title Patient ambulates 47' with prostheses only around furniture modified independent.    Time 8    Period Weeks    Status New                 Plan - 12/22/19 1438    Clinical Impression Statement PT started checking LTGs. Pt did show improvement on Berg to 33/56 but still high fall risk. Able to progress gait distance with SPC today min assist but short of goal. Pt continues to benefit from skilled PT to progress strength, activity tolerance, balance and gait.    Personal Factors and Comorbidities Comorbidity 3+    Comorbidities HTN, DM2, renal transplant, CKD st IV, calciphylaxis, A-Fib, bil. TTAs, TIA    Examination-Activity Limitations Locomotion Level;Squat;Stairs;Stand;Transfers    Examination-Participation Restrictions Community Activity    Stability/Clinical Decision Making Evolving/Moderate complexity    Rehab Potential Good     PT Frequency 2x / week    PT Duration 8 weeks    PT Treatment/Interventions ADLs/Self Care Home Management;DME Instruction;Gait training;Stair training;Functional mobility training;Therapeutic activities;Therapeutic exercise;Balance training;Neuromuscular re-education;Patient/family education;Prosthetic Training;Manual techniques;Vestibular  PT Next Visit Plan Check remaining goals for recert next visit. Will need to schedule. Continue gait training with cane, balance and strengthening, endurance    Consulted and Agree with Plan of Care Patient           Patient will benefit from skilled therapeutic intervention in order to improve the following deficits and impairments:  Abnormal gait, Cardiopulmonary status limiting activity, Decreased activity tolerance, Decreased balance, Decreased endurance, Decreased mobility, Decreased range of motion, Decreased strength, Impaired flexibility, Postural dysfunction, Prosthetic Dependency  Visit Diagnosis: Other abnormalities of gait and mobility  Muscle weakness (generalized)     Problem List Patient Active Problem List   Diagnosis Date Noted  . CKD stage 4 due to type 2 diabetes mellitus ([place]) 04/29/2019  . AKI (acute kidney injury) ([place]) 04/24/2019  . Hyperglycemia 04/24/2019  . Nausea and vomiting 04/24/2019  . Fever 04/23/2019  . Acute metabolic encephalopathy 54/65/0354  . Stroke ([place]) 10/28/2018  . Middle cerebral artery embolism, left 10/28/2018  . Metabolic acidosis 65/68/1275  . Dehydration 05/24/2018  . Pressure injury of skin 05/24/2018  . Dyspnea 05/23/2018  . Acute respiratory failure with hypoxia ([place]) 05/23/2018  . Abnormal cardiovascular stress test 12/29/2013  . Awaiting organ transplant 12/09/2013  . Leg ulcer ([place]) 11/17/2013  . Chronic kidney disease (CKD), stage V ([place]) 11/17/2013  . Diabetes ([place]) 11/17/2013  . Cancer of duodenum ([place]) 11/17/2013  . Dyslipidemia 11/17/2013  . Chronic kidney disease requiring  chronic dialysis ([place]) 11/17/2013  . Hypercholesteremia 11/17/2013  . H/O malignant neoplasm 11/17/2013  . H/O: HTN (hypertension) 11/17/2013  . BP (high blood pressure) 11/17/2013  . Hypotension 11/17/2013  . Angiopathy, peripheral ([place]) 11/17/2013  . AF (paroxysmal atrial fibrillation) ([place]) 11/17/2013  . BKA stump complication ([place]) 17/00/1749  . S/P bilateral BKA (below knee amputation) ([place]) 08/03/2013  . S/P BKA (below knee amputation) bilateral ([place]) 07/30/2013  . Open wound of both legs with complication 44/96/7591  . Acute blood loss anemia 07/12/2013  . Syncope 07/09/2013  . Intolerance to CAPD peritoneal dialysis s/p CAPD cath removal 07/09/2013 07/05/2013  . Critical lower limb ischemia 06/30/2013  . Extremity pain 06/30/2013  . Atherosclerosis of native arteries of the extremities with ulceration(440.23) 06/17/2013  . Gait disorder 05/31/2013  . Weakness 05/24/2013  . Depression 04/03/2013  . GERD (gastroesophageal reflux disease) 04/03/2013  . Atrial fibrillation ([place]) 04/02/2013  . DM (diabetes mellitus) ([place]) 12/28/2012  . Other complications due to renal dialysis device, implant, and graft 05/19/2012  . Diabetes mellitus, type 2 ([place]) 11/13/2011  . Anemia 07/02/2011  . ESRD on hemodialysis ([place]) 05/29/2011    Electa Sniff, PT, DPT, NCS 12/22/2019, 2:41 PM  [facility] [address] [place], [place], [address] Phone: [phone]   Fax:  [phone]  Name: Paul [name] MRN: [id number] Date of Birth: 06/24/1955

## 2019-12-24 ENCOUNTER — Other Ambulatory Visit: Payer: Self-pay

## 2019-12-24 ENCOUNTER — Ambulatory Visit: Payer: Medicare Other

## 2019-12-24 VITALS — BP 140/66

## 2019-12-24 DIAGNOSIS — R2689 Other abnormalities of gait and mobility: Secondary | ICD-10-CM | POA: Diagnosis not present

## 2019-12-24 DIAGNOSIS — M6281 Muscle weakness (generalized): Secondary | ICD-10-CM

## 2019-12-24 DIAGNOSIS — R262 Difficulty in walking, not elsewhere classified: Secondary | ICD-10-CM

## 2019-12-24 DIAGNOSIS — R2681 Unsteadiness on feet: Secondary | ICD-10-CM

## 2019-12-24 DIAGNOSIS — R293 Abnormal posture: Secondary | ICD-10-CM

## 2019-12-24 NOTE — Patient Instructions (Signed)
Access Code: [email or web address] URL: [email or web address] Date: 12/24/2019 Prepared by: Cherly Anderson  Exercises Standing Marching - 1 x daily - 3 x weekly - 1 sets - 10 reps Standing Hip Abduction with Counter Support - 1 x daily - 3 x weekly - 1 sets - 10 reps Standing Hip Extension with Counter Support - 1 x daily - 3 x weekly - 1 sets - 10 reps Side Stepping with Counter Support - 1 x daily - 3 x weekly - 1 sets - 3 reps Seated Hamstring Stretch - 1-2 x daily - 7 x weekly - 1 sets - 3 reps - 30 hold Sit to Stand with Counter Support - 1 x daily - 7 x weekly - 1 sets - 10 reps- has not been doing this one with eyes closed Wide Stance with Counter Support - 1 x daily - 3 x weekly - 1 sets - 3 reps - 30 hold Wide Stance with Head Nods and Counter Support - 1 x daily - 3 x weekly - 1 sets - 10 reps Standing Forward Step Taps with Counter Support - 1 x daily - 3 x weekly - 1 sets - 10 reps - 3 hold

## 2019-12-24 NOTE — Therapy (Signed)
[facility] [address] [place], [place], [address] Phone: [phone]   Fax:  [phone]  Physical Therapy Treatment/Recert  Patient Details  Name: Paul [name] MRN: [id number] Date of Birth: 10/18/1955 Referring Provider (PT): [name], DO   Encounter Date: 12/24/2019   PT End of Session - 12/24/19 1327    Visit Number 13    Number of Visits 29    Date for PT Re-Evaluation 03/23/20   60 day poc, 90 day cert   Authorization Type [employer] A & B    Progress Note Due on Visit 20    PT Start Time 1326   pt running late   PT Stop Time 1405    PT Time Calculation (min) 39 min    Equipment Utilized During Treatment --    Activity Tolerance Patient limited by fatigue;Patient limited by pain   limited by light-headedness   Behavior During Therapy [facility] for tasks assessed/performed           Past Medical History:  Diagnosis Date  . A-fib ([place])   . Anemia    esrd  . Calciphylaxis 05/2013   lower ext  . Cancer ([place])    hx duodenal adenoCa 2002, resected  . Closed left arm fracture 1967  . Corneal abrasion, left   . Depression   . Diabetes mellitus    Type 1 iddm x 11 yrs  . Diabetic neuropathy ([place])   . ESRD on hemodialysis ([place])    Pt has ESRD due to DM.  He had a L upper arm AVF prior to starting HD which was ligated due to L arm steal syndrome.  He started HD in Jan 2014 and did home HD.  The family couldn't cannulate the R arm AVF successfully so by mid 2014 they decided to switch to PD which was done late summer 2014.  In Jan 2015 he was admitted with FTT felt to be due to underdialysis on PD and PD was abandoned and he   . GERD (gastroesophageal reflux disease)   . Glomerulosclerosis, diabetic ([place])   . Heart murmur   . History of blood transfusion   . Hypertension    off of meds due to orthostatic hypotension  . Kidney transplant recipient   . Open wound of both legs with complication     Pt has had progressive wounds of both LE's including gangrene of the toes and patchy necrosis of the calves.  He has been treated at [facility] by Dr Jerline Pain with hyperbaric O2 5d per week.  He is getting Na Thiosulfate with HD for suspected calciphylaxis. Pt says doctor's aren't sure if these ulcers were diabetic ulcers or calciphylaxis.  He underwent bilat BKA on 07/30/13.  He says that the woun    Past Surgical History:  Procedure Laterality Date  . AMPUTATION Bilateral 07/30/2013   Procedure: AMPUTATION BELOW KNEE;  Surgeon: Newt Minion, MD;  Location: [facility];  Service: Orthopedics;  Laterality: Bilateral;  Bilateral Below Knee Amputations  . AMPUTATION Bilateral 09/03/2013   Procedure: AMPUTATION BELOW KNEE;  Surgeon: Newt Minion, MD;  Location: [facility];  Service: Orthopedics;  Laterality: Bilateral;  Bilateral Below Knee Amputation Revision  . AV FISTULA PLACEMENT  06/07/2011   Procedure: ARTERIOVENOUS (AV) FISTULA CREATION;  Surgeon: Angelia Mould, MD;  Location: [facility];  Service: Vascular;  Laterality: Left;  . AV FISTULA PLACEMENT  06/18/2012   Procedure: ARTERIOVENOUS (AV) FISTULA CREATION;  Surgeon:  Mal Misty, MD;  Location: [facility];  Service: Vascular;  Laterality: Right;  [place]  . BILIARY DIVERSION, EXTERNAL  2002  . BONE MARROW BIOPSY  2012  . CAPD INSERTION N/A 01/14/2013   Procedure: LAPAROSCOPIC INSERTION CONTINUOUS AMBULATORY PERITONEAL DIALYSIS  (CAPD) CATHETER;  Surgeon: Adin Hector, MD;  Location: [facility];  Service: General;  Laterality: N/A;  . CAPD REMOVAL N/A 07/09/2013   Procedure: CONTINUOUS AMBULATORY PERITONEAL DIALYSIS  (CAPD) CATHETER REMOVAL;  Surgeon: Adin Hector, MD;  Location: [facility];  Service: General;  Laterality: N/A;  . COLONOSCOPY     Hx: of  . INSERTION OF DIALYSIS CATHETER  05/22/2012   Procedure: INSERTION OF DIALYSIS CATHETER;  Surgeon: Mal Misty, MD;  Location: [facility];  Service: Vascular;  Laterality: N/A;  right internal jugular vein  .  IR ANGIO INTRA EXTRACRAN SEL COM CAROTID INNOMINATE BILAT MOD SED  10/28/2018  . IR ANGIO VERTEBRAL SEL SUBCLAVIAN INNOMINATE UNI R MOD SED  10/28/2018  . IR ANGIO VERTEBRAL SEL VERTEBRAL UNI L MOD SED  10/28/2018  . LIGATION OF ARTERIOVENOUS  FISTULA  05/22/2012   Procedure: LIGATION OF ARTERIOVENOUS  FISTULA;  Surgeon: Mal Misty, MD;  Location: [facility];  Service: Vascular;  Laterality: Left;  Left brachio-cephalic fistula  . LOWER EXTREMITY ANGIOGRAM N/A 06/14/2013   Procedure: LOWER EXTREMITY ANGIOGRAM;  Surgeon: Angelia Mould, MD;  Location: [facility] CATH LAB;  Service: Cardiovascular;  Laterality: N/A;  . NEPHRECTOMY TRANSPLANTED ORGAN    . OTHER SURGICAL HISTORY     Cyst removed from back  . PORTACATH PLACEMENT  2002  . RADIOLOGY WITH ANESTHESIA N/A 10/28/2018   Procedure: IR WITH ANESTHESIA;  Surgeon: Radiologist, Medication, MD;  Location: [facility];  Service: Radiology;  Laterality: N/A;  . REMOVAL OF A DIALYSIS CATHETER    . RENAL BIOPSY, PERCUTANEOUS  2012  . TONSILLECTOMY    . VASCULAR SURGERY    . WHIPPLE PROCEDURE  2002   duodenal ca, Dr. Harlow Asa    Vitals:   12/24/19 1327  BP: 140/66     Subjective Assessment - 12/24/19 1327    Subjective Pt running late to session. Pt reports that he is having more pain since Thursday morning now on both sides of low back in to hips. Not sure how much he can do today as doesn't feel very stable even on walker due to pain. Pt reports he also had nausea and vomiting all day yesterday. Denies any fever. None today. Does not feel his back pain was related to vomiting yesterday. States he is urinating normally. No kidney per his report on left and transplanted kidney on right. Advised to notify MD if any changes.    Pertinent History HTN, DM2, renal transplant, CKD st IV, calciphylaxis, A-Fib, bil. TTAs, TIA    Patient Stated Goals He would like to get off walker completely. Get close to prior level with prostheses which means no device in home &  cane in community.    Currently in Pain? Yes    Pain Score 4     Pain Location Back   and hips   Pain Orientation Right;Left;Lower    Pain Descriptors / Indicators Aching;Sharp    Pain Type Acute pain    Pain Onset In the past 7 days    Pain Frequency Constant    Aggravating Factors  movement makes worse    Pain Relieving Factors laying down as not preventing him from sleeping  [facility] PT Assessment - 12/24/19 1330      Strength   Right Hip ABduction 4/5   pain with overpressure   Left Hip ABduction 4/5   pain on right                        [facility] Adult PT Treatment/Exercise - 12/24/19 1330      Transfers   Transfers Sit to Stand;Stand to Sit    Sit to Stand 5: Supervision    Five time sit to stand comments  52.55 sec from chair with hands    Stand to Sit 5: Supervision      Ambulation/Gait   Ambulation/Gait Yes    Ambulation/Gait Assistance Details in/out of clinic with RW with slow, reciprocal gait. Pt reported pain 3/10 at end of session.    Assistive device Rolling walker;Prostheses    Gait Pattern Step-through pattern    Ambulation Surface Level;Indoor      Exercises   Exercises Other Exercises    Other Exercises  PT verbally reviewed exercises with pt due to him having significant new onset low back pain and feeling very unsteady today with vomiting all day yesterday. Exercises reviewed noted below. PT assessed LBP today. Pt insists vomiting was a separate issue but PT still advising good idea to get checked especially with new onset bilateral LBP. Pt reported pain with right hip flexion in supine with overpressure and had some increase with A/P mobs to right hip. Noted pain on right side with overpressure for left hip flexion. Pain with left hip distraction. No tenderness to paraspinals but did have some increased tightness bilateral. Gentle STM to bilateral paraspinals. Supine passive trunk rotation x 3 to each side. Pt report stretch felt  good on right side. Instructed to try at home but should not be painful.       Prosthetics   Prosthetic Care Comments  Pt not wearing any socks today. Felt like prostheses wear coming off with 5 ply when got home with other day. Discussed being sure to keep smaller ply socks as needed.    Current prosthetic wear tolerance (days/week)  daily    Current prosthetic wear tolerance (#hours/day)  wearing most all awake hours. does remove at times if sitting for long periods    Residual limb condition  Pt denies any issues and continues to monitor callous on right residual limb                  PT Education - 12/24/19 1425    Education Details Discussed recert plan. To monitor back pain and any other symptoms and check with MD if continues. Gentle trunk rotation stretch.    Person(s) Educated Patient    Methods Explanation;Demonstration    Comprehension Verbalized understanding            PT Short Term Goals - 11/26/19 1343      PT SHORT TERM GOAL #1   Title Patient demonstrates & verbalizes understanding of initial HEP. (All STGs Target Date 11/19/2019)    Baseline MET 11/17/2019    Time 4   [place] so STGs set for 4 weeks of care   Status Achieved    Target Date 11/19/19      PT SHORT TERM GOAL #2   Title Berg Balance >/= 36/56    Baseline 11/23/19 Berg Balance= 27/56 (inc. of 1 pt.)    Time 4    Period Weeks  Status Partially Met    Target Date 11/19/19      PT SHORT TERM GOAL #3   Title Dynamic Gait Index with cane >/= 12/24 with supervision    Time 4    Period Weeks    Status Deferred    Target Date 11/19/19      PT SHORT TERM GOAL #4   Title Patient ambulates 200' with cane & prostheses with supervision.    Baseline MET 11/17/2019    Time 4    Period Weeks    Status Achieved    Target Date 11/19/19      PT SHORT TERM GOAL #5   Title Patient negotiates ramps, curbs & stairs single rail with cane & prostheses with minA.    Baseline  11/26/19: Patient negotiates ramps & stairs single rail with cane & prostheses with min A. curb not yet assessed.    Time 4    Period Weeks    Status Partially Met    Target Date 11/19/19             PT Long Term Goals - 12/24/19 1416      PT LONG TERM GOAL #1   Title Patient verbalizes & demonstrates understanding of ongoing fitness plan including home exercises & fitness center exercise program.  (All LTGs Target Date: 12/17/2019)    Baseline Pt has been performing current HEP but has not returned to fitness center program    Time 8    Period Weeks    Status On-going      PT LONG TERM GOAL #2   Title Berg >/= 45/56    Baseline 33/56 on 12/22/19    Time 8    Period Weeks    Status Not Met      PT LONG TERM GOAL #3   Title Dynamic Gait Index with cane >/= 19/24 to indicate lower fall risk.    Time 8    Period Weeks    Status Deferred      PT LONG TERM GOAL #4   Title Patient ambulates >400' with cane & prostheses modified independent.    Baseline 200' with cane min and prostheses min assist.    Time 8    Period Weeks    Status Not Met      PT LONG TERM GOAL #5   Title Patient negotiates ramps, curbs & stairs with single rail with cane & prostheses modified independent.    Time 8    Period Weeks    Status Deferred      PT LONG TERM GOAL #6   Title Patient ambulates 24' with prostheses only around furniture modified independent.    Baseline min assist with SPC 200'    Time 8    Period Weeks    Status Not Met          Updated PT goals:  PT Short Term Goals - 12/24/19 1438      PT SHORT TERM GOAL #1   Title Pt will decrease 5 x sit to stand from 52. 55 sec to <45 sec for improved balance and functional strength.    Baseline 52.55 sec from chair with hands    Time 4    Period Weeks    Status New    Target Date 01/23/20      PT SHORT TERM GOAL #2   Title Berg Balance >/= 36/56    Baseline 11/23/19 Berg Balance= 27/56 (inc. of 1 pt.), 12/22/19 33/56  Time 4     Period Weeks    Status On-going    Target Date 01/23/20      PT SHORT TERM GOAL #3   Title Pt will ambulate 200' with SPC and prostheses CGA for improved mobility.    Baseline 200' min assist    Time 4    Period Weeks    Status New    Target Date 01/23/20           PT Long Term Goals - 12/24/19 1440      PT LONG TERM GOAL #1   Title Patient verbalizes & demonstrates understanding of ongoing fitness plan including home exercises & fitness center exercise program.  (All LTGs Target Date: 12/17/2019)    Baseline Pt has been performing current HEP but has not returned to fitness center program    Time 8    Period Weeks    Status On-going    Target Date 02/22/20      PT LONG TERM GOAL #2   Title Pt will increase Berg from 33/56 to >40/56 for decreased fall risk.    Baseline 33/56 on 12/22/19    Time 8    Period Weeks    Status Revised    Target Date 02/22/20      PT LONG TERM GOAL #3   Title Pt will be able to tolerate 10 min of standing activities without sitting to rest for improved activity tolerance and functional strength.    Time 8    Period Weeks    Status New    Target Date 02/22/20      PT LONG TERM GOAL #4   Title Patient ambulates >300' with cane & prostheses modified independent for improved mobility on level surfaces.    Baseline 200' with cane min and prostheses min assist.    Time 8    Period Weeks    Status Revised    Target Date 02/22/20      PT LONG TERM GOAL #5   Title Pt will negotiate ramp and curb with SPC CGA for improved safety in community.    Time 8    Period Weeks    Status New    Target Date 02/22/20                 Plan - 12/24/19 1406    Clinical Impression Statement Pt presented with new onset LBP that started yesterday. Also had vomiting all day yesterday. Denies any fever or change in urine and no vomiting today. Pt feels it is more muscular but PT did encourage to check with MD if persists. Pt reports pain 3/10 in low back at  end of session which was slightly better than when started. Treatment limited due to pain. Did note tightness along paraspinals. Pt is high fall risk based on 5 x sit to stand of 52.555 sec and needs UE support to perform showing decreased functional strength. He also scored 33/56 on Berg last session which was improvement but still in high fall risk category. Pt fatigues quickly with activities. Ambulating with RW currently and needs min assist with SPC. Pt will benefit from continued PT to continue to address strength, balance and functional mobility deficits.    Personal Factors and Comorbidities Comorbidity 3+    Comorbidities HTN, DM2, renal transplant, CKD st IV, calciphylaxis, A-Fib, bil. TTAs, TIA    Examination-Activity Limitations Locomotion Level;Squat;Stairs;Stand;Transfers    Examination-Participation Restrictions Community Activity    Stability/Clinical Decision Making Evolving/Moderate complexity  Rehab Potential Good    PT Frequency 2x / week    PT Duration 8 weeks    PT Treatment/Interventions ADLs/Self Care Home Management;DME Instruction;Gait training;Stair training;Functional mobility training;Therapeutic activities;Therapeutic exercise;Balance training;Neuromuscular re-education;Patient/family education;Prosthetic Training;Manual techniques;Vestibular    PT Next Visit Plan How is back doing? Did he talk to MD if no improvement? Continue gait training with cane, balance and strengthening, endurance    Consulted and Agree with Plan of Care Patient           Patient will benefit from skilled therapeutic intervention in order to improve the following deficits and impairments:  Abnormal gait, Cardiopulmonary status limiting activity, Decreased activity tolerance, Decreased balance, Decreased endurance, Decreased mobility, Decreased range of motion, Decreased strength, Impaired flexibility, Postural dysfunction, Prosthetic Dependency  Visit Diagnosis: Other abnormalities of gait  and mobility  Muscle weakness (generalized)  Abnormal posture  Difficulty in walking, not elsewhere classified  Unsteadiness on feet     Problem List Patient Active Problem List   Diagnosis Date Noted  . CKD stage 4 due to type 2 diabetes mellitus ([place]) 04/29/2019  . AKI (acute kidney injury) ([place]) 04/24/2019  . Hyperglycemia 04/24/2019  . Nausea and vomiting 04/24/2019  . Fever 04/23/2019  . Acute metabolic encephalopathy 77/41/2878  . Stroke ([place]) 10/28/2018  . Middle cerebral artery embolism, left 10/28/2018  . Metabolic acidosis 67/67/2094  . Dehydration 05/24/2018  . Pressure injury of skin 05/24/2018  . Dyspnea 05/23/2018  . Acute respiratory failure with hypoxia ([place]) 05/23/2018  . Abnormal cardiovascular stress test 12/29/2013  . Awaiting organ transplant 12/09/2013  . Leg ulcer ([place]) 11/17/2013  . Chronic kidney disease (CKD), stage V ([place]) 11/17/2013  . Diabetes ([place]) 11/17/2013  . Cancer of duodenum ([place]) 11/17/2013  . Dyslipidemia 11/17/2013  . Chronic kidney disease requiring chronic dialysis ([place]) 11/17/2013  . Hypercholesteremia 11/17/2013  . H/O malignant neoplasm 11/17/2013  . H/O: HTN (hypertension) 11/17/2013  . BP (high blood pressure) 11/17/2013  . Hypotension 11/17/2013  . Angiopathy, peripheral ([place]) 11/17/2013  . AF (paroxysmal atrial fibrillation) ([place]) 11/17/2013  . BKA stump complication ([place]) 70/96/2836  . S/P bilateral BKA (below knee amputation) ([place]) 08/03/2013  . S/P BKA (below knee amputation) bilateral ([place]) 07/30/2013  . Open wound of both legs with complication 62/94/7654  . Acute blood loss anemia 07/12/2013  . Syncope 07/09/2013  . Intolerance to CAPD peritoneal dialysis s/p CAPD cath removal 07/09/2013 07/05/2013  . Critical lower limb ischemia 06/30/2013  . Extremity pain 06/30/2013  . Atherosclerosis of native arteries of the extremities with ulceration(440.23) 06/17/2013  . Gait disorder 05/31/2013  . Weakness 05/24/2013   . Depression 04/03/2013  . GERD (gastroesophageal reflux disease) 04/03/2013  . Atrial fibrillation ([place]) 04/02/2013  . DM (diabetes mellitus) ([place]) 12/28/2012  . Other complications due to renal dialysis device, implant, and graft 05/19/2012  . Diabetes mellitus, type 2 ([place]) 11/13/2011  . Anemia 07/02/2011  . ESRD on hemodialysis ([place]) 05/29/2011    Electa Sniff, PT, DPT, NCS 12/24/2019, 2:37 PM  [facility] [address] [place], [place], [address] Phone: [phone]   Fax:  [phone]  Name: Paul [name] MRN: [id number] Date of Birth: [birth year]

## 2019-12-28 ENCOUNTER — Encounter (HOSPITAL_COMMUNITY): Payer: Managed Care, Other (non HMO)

## 2019-12-30 ENCOUNTER — Encounter (HOSPITAL_COMMUNITY): Payer: Self-pay

## 2019-12-30 ENCOUNTER — Inpatient Hospital Stay (HOSPITAL_COMMUNITY)
Admission: EM | Admit: 2019-12-30 | Discharge: 2020-01-18 | DRG: 474 | Disposition: A | Discharge status: Rehabilitation Facility | Payer: Medicare Other | Attending: Internal Medicine | Admitting: Internal Medicine

## 2019-12-30 DIAGNOSIS — I120 Hypertensive chronic kidney disease with stage 5 chronic kidney disease or end stage renal disease: Secondary | ICD-10-CM | POA: Diagnosis present

## 2019-12-30 DIAGNOSIS — E876 Hypokalemia: Secondary | ICD-10-CM | POA: Diagnosis present

## 2019-12-30 DIAGNOSIS — T45515A Adverse effect of anticoagulants, initial encounter: Secondary | ICD-10-CM | POA: Diagnosis not present

## 2019-12-30 DIAGNOSIS — E785 Hyperlipidemia, unspecified: Secondary | ICD-10-CM | POA: Diagnosis present

## 2019-12-30 DIAGNOSIS — D62 Acute posthemorrhagic anemia: Secondary | ICD-10-CM | POA: Diagnosis not present

## 2019-12-30 DIAGNOSIS — N186 End stage renal disease: Secondary | ICD-10-CM

## 2019-12-30 DIAGNOSIS — T8743 Infection of amputation stump, right lower extremity: Principal | ICD-10-CM | POA: Diagnosis present

## 2019-12-30 DIAGNOSIS — R6520 Severe sepsis without septic shock: Secondary | ICD-10-CM | POA: Diagnosis present

## 2019-12-30 DIAGNOSIS — K8689 Other specified diseases of pancreas: Secondary | ICD-10-CM | POA: Diagnosis present

## 2019-12-30 DIAGNOSIS — Z89512 Acquired absence of left leg below knee: Secondary | ICD-10-CM

## 2019-12-30 DIAGNOSIS — Z7982 Long term (current) use of aspirin: Secondary | ICD-10-CM

## 2019-12-30 DIAGNOSIS — E1022 Type 1 diabetes mellitus with diabetic chronic kidney disease: Secondary | ICD-10-CM | POA: Diagnosis present

## 2019-12-30 DIAGNOSIS — N179 Acute kidney failure, unspecified: Secondary | ICD-10-CM | POA: Diagnosis present

## 2019-12-30 DIAGNOSIS — E1122 Type 2 diabetes mellitus with diabetic chronic kidney disease: Secondary | ICD-10-CM | POA: Diagnosis present

## 2019-12-30 DIAGNOSIS — R040 Epistaxis: Secondary | ICD-10-CM | POA: Diagnosis not present

## 2019-12-30 DIAGNOSIS — L0390 Cellulitis, unspecified: Secondary | ICD-10-CM | POA: Diagnosis present

## 2019-12-30 DIAGNOSIS — Z20822 Contact with and (suspected) exposure to covid-19: Secondary | ICD-10-CM | POA: Diagnosis present

## 2019-12-30 DIAGNOSIS — E119 Type 2 diabetes mellitus without complications: Secondary | ICD-10-CM

## 2019-12-30 DIAGNOSIS — F329 Major depressive disorder, single episode, unspecified: Secondary | ICD-10-CM | POA: Diagnosis present

## 2019-12-30 DIAGNOSIS — Y835 Amputation of limb(s) as the cause of abnormal reaction of the patient, or of later complication, without mention of misadventure at the time of the procedure: Secondary | ICD-10-CM | POA: Diagnosis present

## 2019-12-30 DIAGNOSIS — E1042 Type 1 diabetes mellitus with diabetic polyneuropathy: Secondary | ICD-10-CM | POA: Diagnosis present

## 2019-12-30 DIAGNOSIS — M86261 Subacute osteomyelitis, right tibia and fibula: Secondary | ICD-10-CM

## 2019-12-30 DIAGNOSIS — Z940 Kidney transplant status: Secondary | ICD-10-CM

## 2019-12-30 DIAGNOSIS — E1051 Type 1 diabetes mellitus with diabetic peripheral angiopathy without gangrene: Secondary | ICD-10-CM | POA: Diagnosis present

## 2019-12-30 DIAGNOSIS — Z7952 Long term (current) use of systemic steroids: Secondary | ICD-10-CM

## 2019-12-30 DIAGNOSIS — G9341 Metabolic encephalopathy: Secondary | ICD-10-CM | POA: Diagnosis not present

## 2019-12-30 DIAGNOSIS — Z79899 Other long term (current) drug therapy: Secondary | ICD-10-CM

## 2019-12-30 DIAGNOSIS — Z89511 Acquired absence of right leg below knee: Secondary | ICD-10-CM

## 2019-12-30 DIAGNOSIS — Z7901 Long term (current) use of anticoagulants: Secondary | ICD-10-CM

## 2019-12-30 DIAGNOSIS — E1065 Type 1 diabetes mellitus with hyperglycemia: Secondary | ICD-10-CM | POA: Diagnosis present

## 2019-12-30 DIAGNOSIS — Z794 Long term (current) use of insulin: Secondary | ICD-10-CM

## 2019-12-30 DIAGNOSIS — Z888 Allergy status to other drugs, medicaments and biological substances status: Secondary | ICD-10-CM

## 2019-12-30 DIAGNOSIS — A419 Sepsis, unspecified organism: Secondary | ICD-10-CM | POA: Diagnosis present

## 2019-12-30 DIAGNOSIS — Y830 Surgical operation with transplant of whole organ as the cause of abnormal reaction of the patient, or of later complication, without mention of misadventure at the time of the procedure: Secondary | ICD-10-CM | POA: Diagnosis present

## 2019-12-30 DIAGNOSIS — D631 Anemia in chronic kidney disease: Secondary | ICD-10-CM | POA: Diagnosis present

## 2019-12-30 DIAGNOSIS — T8740 Infection of amputation stump, unspecified extremity: Secondary | ICD-10-CM

## 2019-12-30 DIAGNOSIS — K5900 Constipation, unspecified: Secondary | ICD-10-CM | POA: Diagnosis not present

## 2019-12-30 DIAGNOSIS — L02415 Cutaneous abscess of right lower limb: Secondary | ICD-10-CM | POA: Diagnosis present

## 2019-12-30 DIAGNOSIS — J811 Chronic pulmonary edema: Secondary | ICD-10-CM

## 2019-12-30 DIAGNOSIS — D649 Anemia, unspecified: Secondary | ICD-10-CM | POA: Diagnosis present

## 2019-12-30 DIAGNOSIS — T8619 Other complication of kidney transplant: Secondary | ICD-10-CM | POA: Diagnosis present

## 2019-12-30 DIAGNOSIS — Z9119 Patient's noncompliance with other medical treatment and regimen: Secondary | ICD-10-CM

## 2019-12-30 DIAGNOSIS — L03115 Cellulitis of right lower limb: Secondary | ICD-10-CM | POA: Diagnosis present

## 2019-12-30 DIAGNOSIS — T8781 Dehiscence of amputation stump: Secondary | ICD-10-CM

## 2019-12-30 DIAGNOSIS — K219 Gastro-esophageal reflux disease without esophagitis: Secondary | ICD-10-CM | POA: Diagnosis present

## 2019-12-30 DIAGNOSIS — N2581 Secondary hyperparathyroidism of renal origin: Secondary | ICD-10-CM | POA: Diagnosis present

## 2019-12-30 DIAGNOSIS — Z85068 Personal history of other malignant neoplasm of small intestine: Secondary | ICD-10-CM

## 2019-12-30 DIAGNOSIS — I480 Paroxysmal atrial fibrillation: Secondary | ICD-10-CM | POA: Diagnosis present

## 2019-12-30 DIAGNOSIS — Z992 Dependence on renal dialysis: Secondary | ICD-10-CM

## 2019-12-30 DIAGNOSIS — E1069 Type 1 diabetes mellitus with other specified complication: Secondary | ICD-10-CM | POA: Diagnosis present

## 2019-12-30 DIAGNOSIS — M25461 Effusion, right knee: Secondary | ICD-10-CM | POA: Diagnosis present

## 2019-12-30 LAB — CBC WITH DIFFERENTIAL/PLATELET
Abs Immature Granulocytes: 0.13 10*3/uL — ABNORMAL HIGH (ref 0.00–0.07)
Basophils Absolute: 0.1 10*3/uL (ref 0.0–0.1)
Basophils Relative: 1 %
Eosinophils Absolute: 0.3 10*3/uL (ref 0.0–0.5)
Eosinophils Relative: 2 %
HCT: 34.2 % — ABNORMAL LOW (ref 39.0–52.0)
Hemoglobin: 10.4 g/dL — ABNORMAL LOW (ref 13.0–17.0)
Immature Granulocytes: 1 %
Lymphocytes Relative: 13 %
Lymphs Abs: 1.7 10*3/uL (ref 0.7–4.0)
MCH: 28.1 pg (ref 26.0–34.0)
MCHC: 30.4 g/dL (ref 30.0–36.0)
MCV: 92.4 fL (ref 80.0–100.0)
Monocytes Absolute: 1 10*3/uL (ref 0.1–1.0)
Monocytes Relative: 8 %
Neutro Abs: 10.1 10*3/uL — ABNORMAL HIGH (ref 1.7–7.7)
Neutrophils Relative %: 75 %
Platelets: 161 10*3/uL (ref 150–400)
RBC: 3.7 MIL/uL — ABNORMAL LOW (ref 4.22–5.81)
RDW: 15.2 % (ref 11.5–15.5)
WBC: 13.3 10*3/uL — ABNORMAL HIGH (ref 4.0–10.5)
nRBC: 0 % (ref 0.0–0.2)

## 2019-12-30 LAB — COMPREHENSIVE METABOLIC PANEL
ALT: 11 U/L (ref 0–44)
AST: 10 U/L — ABNORMAL LOW (ref 15–41)
Albumin: 2.6 g/dL — ABNORMAL LOW (ref 3.5–5.0)
Alkaline Phosphatase: 59 U/L (ref 38–126)
Anion gap: 13 (ref 5–15)
BUN: 80 mg/dL — ABNORMAL HIGH (ref 8–23)
CO2: 7 mmol/L — ABNORMAL LOW (ref 22–32)
Calcium: 8.1 mg/dL — ABNORMAL LOW (ref 8.9–10.3)
Chloride: 121 mmol/L — ABNORMAL HIGH (ref 98–111)
Creatinine, Ser: 4.76 mg/dL — ABNORMAL HIGH (ref 0.61–1.24)
GFR calc Af Amer: 14 mL/min — ABNORMAL LOW (ref >60)
GFR calc non Af Amer: 12 mL/min — ABNORMAL LOW (ref >60)
Glucose, Bld: 269 mg/dL — ABNORMAL HIGH (ref 70–99)
Potassium: 3.9 mmol/L (ref 3.5–5.1)
Sodium: 141 mmol/L (ref 135–145)
Total Bilirubin: 0.7 mg/dL (ref 0.3–1.2)
Total Protein: 5.7 g/dL — ABNORMAL LOW (ref 6.5–8.1)

## 2019-12-30 LAB — LACTIC ACID, PLASMA: Lactic Acid, Venous: 0.5 mmol/L (ref 0.5–1.9)

## 2019-12-30 NOTE — ED Triage Notes (Addendum)
Pt arrives to ED via [facility] w/ c/o RLE infection. Pt has hx B BKA and has developed an infection that started 3 weeks ago. Pt endorses 7/10 pain at infection site. Pt denies fever, chills. EMS VSS.

## 2019-12-31 ENCOUNTER — Emergency Department (HOSPITAL_COMMUNITY): Payer: Medicare Other

## 2019-12-31 ENCOUNTER — Encounter (HOSPITAL_COMMUNITY): Payer: Self-pay | Admitting: Internal Medicine

## 2019-12-31 ENCOUNTER — Ambulatory Visit: Payer: Medicare Other

## 2019-12-31 ENCOUNTER — Other Ambulatory Visit: Payer: Self-pay

## 2019-12-31 DIAGNOSIS — D62 Acute posthemorrhagic anemia: Secondary | ICD-10-CM | POA: Diagnosis not present

## 2019-12-31 DIAGNOSIS — Y830 Surgical operation with transplant of whole organ as the cause of abnormal reaction of the patient, or of later complication, without mention of misadventure at the time of the procedure: Secondary | ICD-10-CM | POA: Diagnosis present

## 2019-12-31 DIAGNOSIS — T8781 Dehiscence of amputation stump: Secondary | ICD-10-CM | POA: Diagnosis present

## 2019-12-31 DIAGNOSIS — M86261 Subacute osteomyelitis, right tibia and fibula: Secondary | ICD-10-CM | POA: Diagnosis present

## 2019-12-31 DIAGNOSIS — Z89512 Acquired absence of left leg below knee: Secondary | ICD-10-CM | POA: Diagnosis not present

## 2019-12-31 DIAGNOSIS — E1042 Type 1 diabetes mellitus with diabetic polyneuropathy: Secondary | ICD-10-CM | POA: Diagnosis present

## 2019-12-31 DIAGNOSIS — Z4781 Encounter for orthopedic aftercare following surgical amputation: Secondary | ICD-10-CM | POA: Diagnosis not present

## 2019-12-31 DIAGNOSIS — T8743 Infection of amputation stump, right lower extremity: Secondary | ICD-10-CM | POA: Diagnosis present

## 2019-12-31 DIAGNOSIS — R6520 Severe sepsis without septic shock: Secondary | ICD-10-CM | POA: Diagnosis present

## 2019-12-31 DIAGNOSIS — E1051 Type 1 diabetes mellitus with diabetic peripheral angiopathy without gangrene: Secondary | ICD-10-CM | POA: Diagnosis present

## 2019-12-31 DIAGNOSIS — N179 Acute kidney failure, unspecified: Secondary | ICD-10-CM

## 2019-12-31 DIAGNOSIS — A419 Sepsis, unspecified organism: Secondary | ICD-10-CM | POA: Diagnosis present

## 2019-12-31 DIAGNOSIS — L03115 Cellulitis of right lower limb: Secondary | ICD-10-CM | POA: Diagnosis present

## 2019-12-31 DIAGNOSIS — Z20822 Contact with and (suspected) exposure to covid-19: Secondary | ICD-10-CM | POA: Diagnosis present

## 2019-12-31 DIAGNOSIS — R040 Epistaxis: Secondary | ICD-10-CM | POA: Diagnosis not present

## 2019-12-31 DIAGNOSIS — E1022 Type 1 diabetes mellitus with diabetic chronic kidney disease: Secondary | ICD-10-CM | POA: Diagnosis present

## 2019-12-31 DIAGNOSIS — Z89511 Acquired absence of right leg below knee: Secondary | ICD-10-CM | POA: Diagnosis not present

## 2019-12-31 DIAGNOSIS — E1065 Type 1 diabetes mellitus with hyperglycemia: Secondary | ICD-10-CM | POA: Diagnosis present

## 2019-12-31 DIAGNOSIS — D631 Anemia in chronic kidney disease: Secondary | ICD-10-CM | POA: Diagnosis present

## 2019-12-31 DIAGNOSIS — F329 Major depressive disorder, single episode, unspecified: Secondary | ICD-10-CM | POA: Diagnosis present

## 2019-12-31 DIAGNOSIS — D638 Anemia in other chronic diseases classified elsewhere: Secondary | ICD-10-CM | POA: Diagnosis not present

## 2019-12-31 DIAGNOSIS — I480 Paroxysmal atrial fibrillation: Secondary | ICD-10-CM | POA: Diagnosis present

## 2019-12-31 DIAGNOSIS — N184 Chronic kidney disease, stage 4 (severe): Secondary | ICD-10-CM | POA: Diagnosis not present

## 2019-12-31 DIAGNOSIS — E1122 Type 2 diabetes mellitus with diabetic chronic kidney disease: Secondary | ICD-10-CM | POA: Diagnosis not present

## 2019-12-31 DIAGNOSIS — E1142 Type 2 diabetes mellitus with diabetic polyneuropathy: Secondary | ICD-10-CM | POA: Diagnosis not present

## 2019-12-31 DIAGNOSIS — T8740 Infection of amputation stump, unspecified extremity: Secondary | ICD-10-CM | POA: Diagnosis present

## 2019-12-31 DIAGNOSIS — L02415 Cutaneous abscess of right lower limb: Secondary | ICD-10-CM | POA: Diagnosis present

## 2019-12-31 DIAGNOSIS — Y835 Amputation of limb(s) as the cause of abnormal reaction of the patient, or of later complication, without mention of misadventure at the time of the procedure: Secondary | ICD-10-CM | POA: Diagnosis present

## 2019-12-31 DIAGNOSIS — N2581 Secondary hyperparathyroidism of renal origin: Secondary | ICD-10-CM | POA: Diagnosis present

## 2019-12-31 DIAGNOSIS — Z992 Dependence on renal dialysis: Secondary | ICD-10-CM | POA: Diagnosis not present

## 2019-12-31 DIAGNOSIS — R7309 Other abnormal glucose: Secondary | ICD-10-CM | POA: Diagnosis not present

## 2019-12-31 DIAGNOSIS — I120 Hypertensive chronic kidney disease with stage 5 chronic kidney disease or end stage renal disease: Secondary | ICD-10-CM | POA: Diagnosis present

## 2019-12-31 DIAGNOSIS — E1069 Type 1 diabetes mellitus with other specified complication: Secondary | ICD-10-CM | POA: Diagnosis present

## 2019-12-31 DIAGNOSIS — N186 End stage renal disease: Secondary | ICD-10-CM | POA: Diagnosis present

## 2019-12-31 DIAGNOSIS — T8619 Other complication of kidney transplant: Secondary | ICD-10-CM | POA: Diagnosis present

## 2019-12-31 DIAGNOSIS — L0390 Cellulitis, unspecified: Secondary | ICD-10-CM | POA: Diagnosis present

## 2019-12-31 DIAGNOSIS — G9341 Metabolic encephalopathy: Secondary | ICD-10-CM | POA: Diagnosis not present

## 2019-12-31 LAB — URINALYSIS, ROUTINE W REFLEX MICROSCOPIC
Bilirubin Urine: NEGATIVE
Glucose, UA: 150 mg/dL — AB
Ketones, ur: NEGATIVE mg/dL
Nitrite: NEGATIVE
Protein, ur: >=300 mg/dL — AB
Specific Gravity, Urine: 1.01 (ref 1.005–1.030)
WBC, UA: >50 WBC/hpf — ABNORMAL HIGH (ref 0–5)
pH: 5 (ref 5.0–8.0)

## 2019-12-31 LAB — SARS CORONAVIRUS 2 BY RT PCR (HOSPITAL ORDER, PERFORMED IN ~~LOC~~ HOSPITAL LAB): SARS Coronavirus 2: NEGATIVE

## 2019-12-31 LAB — HIV ANTIBODY (ROUTINE TESTING W REFLEX): HIV Screen 4th Generation wRfx: NONREACTIVE

## 2019-12-31 LAB — BASIC METABOLIC PANEL
BUN: 92 mg/dL — ABNORMAL HIGH (ref 8–23)
CO2: <7 mmol/L — ABNORMAL LOW (ref 22–32)
Calcium: 8.4 mg/dL — ABNORMAL LOW (ref 8.9–10.3)
Chloride: 119 mmol/L — ABNORMAL HIGH (ref 98–111)
Creatinine, Ser: 5.25 mg/dL — ABNORMAL HIGH (ref 0.61–1.24)
GFR calc Af Amer: 12 mL/min — ABNORMAL LOW (ref >60)
GFR calc non Af Amer: 11 mL/min — ABNORMAL LOW (ref >60)
Glucose, Bld: 254 mg/dL — ABNORMAL HIGH (ref 70–99)
Potassium: 3.7 mmol/L (ref 3.5–5.1)
Sodium: 142 mmol/L (ref 135–145)

## 2019-12-31 LAB — HEMOGLOBIN A1C
Hgb A1c MFr Bld: 10.3 % — ABNORMAL HIGH (ref 4.8–5.6)
Mean Plasma Glucose: 248.91 mg/dL

## 2019-12-31 LAB — CBG MONITORING, ED: Glucose-Capillary: 247 mg/dL — ABNORMAL HIGH (ref 70–99)

## 2019-12-31 LAB — GLUCOSE, CAPILLARY: Glucose-Capillary: 168 mg/dL — ABNORMAL HIGH (ref 70–99)

## 2019-12-31 MED ORDER — STERILE WATER FOR INJECTION IV SOLN
INTRAVENOUS | Status: AC
  Filled 2019-12-31 (×3): qty 850

## 2019-12-31 MED ORDER — VANCOMYCIN HCL IN DEXTROSE 1-5 GM/200ML-% IV SOLN
1000.0000 mg | INTRAVENOUS | Status: DC
  Administered 2019-12-31 – 2020-01-02 (×2): 1000 mg via INTRAVENOUS
  Filled 2019-12-31 (×3): qty 200

## 2019-12-31 MED ORDER — INSULIN ASPART 100 UNIT/ML ~~LOC~~ SOLN
0.0000 [IU] | Freq: Three times a day (TID) | SUBCUTANEOUS | Status: DC
  Administered 2019-12-31 – 2020-01-01 (×3): 3 [IU] via SUBCUTANEOUS
  Administered 2020-01-01 – 2020-01-02 (×3): 2 [IU] via SUBCUTANEOUS
  Administered 2020-01-02: 5 [IU] via SUBCUTANEOUS
  Administered 2020-01-03: 3 [IU] via SUBCUTANEOUS
  Administered 2020-01-03: 2 [IU] via SUBCUTANEOUS
  Administered 2020-01-04: 3 [IU] via SUBCUTANEOUS
  Administered 2020-01-05: 1 [IU] via SUBCUTANEOUS
  Administered 2020-01-05: 2 [IU] via SUBCUTANEOUS
  Administered 2020-01-05: 1 [IU] via SUBCUTANEOUS
  Administered 2020-01-07: 3 [IU] via SUBCUTANEOUS
  Administered 2020-01-07: 2 [IU] via SUBCUTANEOUS
  Administered 2020-01-07: 1 [IU] via SUBCUTANEOUS
  Administered 2020-01-08 (×3): 3 [IU] via SUBCUTANEOUS
  Administered 2020-01-09 (×2): 5 [IU] via SUBCUTANEOUS
  Administered 2020-01-09 – 2020-01-10 (×2): 7 [IU] via SUBCUTANEOUS
  Administered 2020-01-10: 5 [IU] via SUBCUTANEOUS
  Administered 2020-01-10: 9 [IU] via SUBCUTANEOUS
  Administered 2020-01-11: 7 [IU] via SUBCUTANEOUS
  Administered 2020-01-11: 2 [IU] via SUBCUTANEOUS
  Administered 2020-01-11: 3 [IU] via SUBCUTANEOUS
  Administered 2020-01-12 (×2): 2 [IU] via SUBCUTANEOUS
  Administered 2020-01-13: 5 [IU] via SUBCUTANEOUS
  Administered 2020-01-13: 1 [IU] via SUBCUTANEOUS
  Administered 2020-01-14 (×2): 2 [IU] via SUBCUTANEOUS
  Administered 2020-01-14 – 2020-01-15 (×2): 3 [IU] via SUBCUTANEOUS
  Administered 2020-01-15: 5 [IU] via SUBCUTANEOUS
  Administered 2020-01-16: 3 [IU] via SUBCUTANEOUS
  Administered 2020-01-16: 2 [IU] via SUBCUTANEOUS
  Administered 2020-01-16: 9 [IU] via SUBCUTANEOUS
  Administered 2020-01-17: 2 [IU] via SUBCUTANEOUS
  Administered 2020-01-17: 7 [IU] via SUBCUTANEOUS
  Administered 2020-01-17: 2 [IU] via SUBCUTANEOUS
  Administered 2020-01-18: 1 [IU] via SUBCUTANEOUS
  Administered 2020-01-18: 2 [IU] via SUBCUTANEOUS

## 2019-12-31 MED ORDER — TAMSULOSIN HCL 0.4 MG PO CAPS
0.4000 mg | ORAL_CAPSULE | Freq: Every day | ORAL | Status: DC
  Administered 2019-12-31 – 2020-01-17 (×14): 0.4 mg via ORAL
  Filled 2019-12-31 (×15): qty 1

## 2019-12-31 MED ORDER — VITAMIN D 25 MCG (1000 UNIT) PO TABS
4000.0000 [IU] | ORAL_TABLET | Freq: Every day | ORAL | Status: DC
  Administered 2019-12-31 – 2020-01-18 (×15): 4000 [IU] via ORAL
  Filled 2019-12-31 (×17): qty 4

## 2019-12-31 MED ORDER — ATORVASTATIN CALCIUM 10 MG PO TABS
10.0000 mg | ORAL_TABLET | ORAL | Status: DC
  Administered 2019-12-31 – 2020-01-17 (×7): 10 mg via ORAL
  Filled 2019-12-31 (×10): qty 1

## 2019-12-31 MED ORDER — HYDRALAZINE HCL 25 MG PO TABS: 25.0000 mg | ORAL_TABLET | Freq: Four times a day (QID) | ORAL | Status: DC | PRN

## 2019-12-31 MED ORDER — HYDROMORPHONE HCL 1 MG/ML IJ SOLN
0.5000 mg | INTRAMUSCULAR | Status: DC | PRN
  Administered 2019-12-31 – 2020-01-03 (×7): 1 mg via INTRAVENOUS
  Administered 2020-01-05 – 2020-01-12 (×2): 0.5 mg via INTRAVENOUS
  Administered 2020-01-13: 1 mg via INTRAVENOUS
  Administered 2020-01-14: 0.5 mg via INTRAVENOUS
  Filled 2019-12-31 (×2): qty 1
  Filled 2019-12-31: qty 0.5
  Filled 2019-12-31 (×3): qty 1
  Filled 2019-12-31: qty 0.5
  Filled 2019-12-31 (×2): qty 1
  Filled 2019-12-31: qty 0.5

## 2019-12-31 MED ORDER — SODIUM BICARBONATE-DEXTROSE 150-5 MEQ/L-% IV SOLN
150.0000 meq | INTRAVENOUS | Status: DC
  Filled 2019-12-31: qty 1000

## 2019-12-31 MED ORDER — SODIUM BICARBONATE 650 MG PO TABS
1950.0000 mg | ORAL_TABLET | Freq: Four times a day (QID) | ORAL | Status: DC
  Administered 2019-12-31 – 2020-01-02 (×9): 1950 mg via ORAL
  Filled 2019-12-31 (×12): qty 3

## 2019-12-31 MED ORDER — HEPARIN SODIUM (PORCINE) 5000 UNIT/ML IJ SOLN
5000.0000 [IU] | Freq: Two times a day (BID) | INTRAMUSCULAR | Status: DC
  Administered 2019-12-31 – 2020-01-03 (×8): 5000 [IU] via SUBCUTANEOUS
  Filled 2019-12-31 (×8): qty 1

## 2019-12-31 MED ORDER — CALCIUM ACETATE (PHOS BINDER) 667 MG PO CAPS
1334.0000 mg | ORAL_CAPSULE | Freq: Three times a day (TID) | ORAL | Status: DC
  Administered 2019-12-31 – 2020-01-02 (×5): 1334 mg via ORAL
  Filled 2019-12-31 (×7): qty 2

## 2019-12-31 MED ORDER — SERTRALINE HCL 25 MG PO TABS
50.0000 mg | ORAL_TABLET | Freq: Every day | ORAL | Status: DC
  Administered 2019-12-31 – 2020-01-18 (×15): 50 mg via ORAL
  Filled 2019-12-31: qty 1
  Filled 2019-12-31 (×4): qty 2
  Filled 2019-12-31: qty 1
  Filled 2019-12-31 (×4): qty 2
  Filled 2019-12-31: qty 1
  Filled 2019-12-31 (×2): qty 2
  Filled 2019-12-31: qty 1
  Filled 2019-12-31 (×3): qty 2

## 2019-12-31 MED ORDER — SODIUM BICARBONATE 8.4 % IV SOLN
INTRAVENOUS | Status: DC
  Filled 2019-12-31: qty 150

## 2019-12-31 MED ORDER — HYDRALAZINE HCL 50 MG PO TABS
50.0000 mg | ORAL_TABLET | Freq: Four times a day (QID) | ORAL | Status: DC
  Administered 2019-12-31 – 2020-01-03 (×11): 50 mg via ORAL
  Filled 2019-12-31 (×5): qty 1
  Filled 2019-12-31: qty 2
  Filled 2019-12-31 (×7): qty 1

## 2019-12-31 MED ORDER — MAGNESIUM CHLORIDE 64 MG PO TBEC
2.0000 | DELAYED_RELEASE_TABLET | Freq: Two times a day (BID) | ORAL | Status: DC
  Administered 2019-12-31 – 2020-01-18 (×30): 128 mg via ORAL
  Filled 2019-12-31 (×37): qty 2

## 2019-12-31 MED ORDER — CALCITRIOL 0.25 MCG PO CAPS
0.2500 ug | ORAL_CAPSULE | Freq: Every day | ORAL | Status: DC
  Administered 2019-12-31 – 2020-01-03 (×4): 0.25 ug via ORAL
  Filled 2019-12-31 (×4): qty 1

## 2019-12-31 MED ORDER — ASPIRIN EC 81 MG PO TBEC
81.0000 mg | DELAYED_RELEASE_TABLET | Freq: Every day | ORAL | Status: DC
  Administered 2019-12-31 – 2020-01-18 (×16): 81 mg via ORAL
  Filled 2019-12-31 (×17): qty 1

## 2019-12-31 MED ORDER — ACETAMINOPHEN 650 MG RE SUPP: 650.0000 mg | Freq: Four times a day (QID) | RECTAL | Status: DC | PRN

## 2019-12-31 MED ORDER — SODIUM CHLORIDE 0.9 % IV SOLN
2.0000 g | INTRAVENOUS | Status: AC
  Administered 2019-12-31 – 2020-01-13 (×12): 2 g via INTRAVENOUS
  Filled 2019-12-31 (×15): qty 2

## 2019-12-31 MED ORDER — VANCOMYCIN HCL IN DEXTROSE 1-5 GM/200ML-% IV SOLN: 1000.0000 mg | Freq: Once | INTRAVENOUS | Status: DC

## 2019-12-31 MED ORDER — ACETAMINOPHEN 325 MG PO TABS
650.0000 mg | ORAL_TABLET | Freq: Four times a day (QID) | ORAL | Status: DC | PRN
  Administered 2020-01-09: 650 mg via ORAL
  Filled 2019-12-31: qty 2

## 2019-12-31 MED ORDER — PANTOPRAZOLE SODIUM 40 MG PO TBEC
40.0000 mg | DELAYED_RELEASE_TABLET | Freq: Every day | ORAL | Status: DC
  Administered 2019-12-31 – 2020-01-18 (×16): 40 mg via ORAL
  Filled 2019-12-31 (×16): qty 1

## 2019-12-31 MED ORDER — PREDNISONE 10 MG PO TABS
5.0000 mg | ORAL_TABLET | Freq: Every day | ORAL | Status: DC
  Administered 2020-01-01 – 2020-01-18 (×15): 5 mg via ORAL
  Filled 2019-12-31 (×17): qty 1

## 2019-12-31 NOTE — ED Notes (Signed)
Pt transferred to hospital bed for comfort.

## 2019-12-31 NOTE — ED Provider Notes (Signed)
[facility] Emergency Department Provider Note MRN:  [id number]  Arrival date & time: 12/31/19     Chief Complaint   Cellulitis   History of Present Illness   Paul [name] is a 64 y.o. year-old male with a history of kidney transplant presenting to the ED with chief complaint of leg pain.  Worsening pain to the right leg stump for the past 2 or 3 days.  Denies fever.  Pain is moderate to severe, worse with motion or palpation.  Denies chest pain or shortness of breath, no abdominal pain, no vomiting, no confusion.  Kidney transplant back in 2015, currently not on dialysis.  Review of Systems  A complete 10 system review of systems was obtained and all systems are negative except as noted in the HPI and PMH.   Patient's Health History    Past Medical History:  Diagnosis Date  . A-fib ([place])   . Anemia    esrd  . Calciphylaxis 05/2013   lower ext  . Cancer ([place])    hx duodenal adenoCa 2002, resected  . Closed left arm fracture 1967  . Corneal abrasion, left   . Depression   . Diabetes mellitus    Type 1 iddm x 11 yrs  . Diabetic neuropathy ([place])   . ESRD on hemodialysis ([place])    Pt has ESRD due to DM.  He had a L upper arm AVF prior to starting HD which was ligated due to L arm steal syndrome.  He started HD in Jan 2014 and did home HD.  The family couldn't cannulate the R arm AVF successfully so by mid 2014 they decided to switch to PD which was done late summer 2014.  In Jan 2015 he was admitted with FTT felt to be due to underdialysis on PD and PD was abandoned and he   . GERD (gastroesophageal reflux disease)   . Glomerulosclerosis, diabetic ([place])   . Heart murmur   . History of blood transfusion   . Hypertension    off of meds due to orthostatic hypotension  . Kidney transplant recipient   . Open wound of both legs with complication    Pt has had progressive wounds of both LE's including gangrene of the toes and patchy necrosis of the calves.   He has been treated at [facility] by Dr Jerline Pain with hyperbaric O2 5d per week.  He is getting Na Thiosulfate with HD for suspected calciphylaxis. Pt says doctor's aren't sure if these ulcers were diabetic ulcers or calciphylaxis.  He underwent bilat BKA on 07/30/13.  He says that the woun    Past Surgical History:  Procedure Laterality Date  . AMPUTATION Bilateral 07/30/2013   Procedure: AMPUTATION BELOW KNEE;  Surgeon: Newt Minion, MD;  Location: [place];  Service: Orthopedics;  Laterality: Bilateral;  Bilateral Below Knee Amputations  . AMPUTATION Bilateral 09/03/2013   Procedure: AMPUTATION BELOW KNEE;  Surgeon: Newt Minion, MD;  Location: [place];  Service: Orthopedics;  Laterality: Bilateral;  Bilateral Below Knee Amputation Revision  . AV FISTULA PLACEMENT  06/07/2011   Procedure: ARTERIOVENOUS (AV) FISTULA CREATION;  Surgeon: Angelia Mould, MD;  Location: [place];  Service: Vascular;  Laterality: Left;  . AV FISTULA PLACEMENT  06/18/2012   Procedure: ARTERIOVENOUS (AV) FISTULA CREATION;  Surgeon: Mal Misty, MD;  Location: [place];  Service: Vascular;  Laterality: Right;  [place]  . BILIARY DIVERSION, EXTERNAL  2002  . BONE MARROW BIOPSY  2012  .  CAPD INSERTION N/A 01/14/2013   Procedure: LAPAROSCOPIC INSERTION CONTINUOUS AMBULATORY PERITONEAL DIALYSIS  (CAPD) CATHETER;  Surgeon: Adin Hector, MD;  Location: [place];  Service: General;  Laterality: N/A;  . CAPD REMOVAL N/A 07/09/2013   Procedure: CONTINUOUS AMBULATORY PERITONEAL DIALYSIS  (CAPD) CATHETER REMOVAL;  Surgeon: Adin Hector, MD;  Location: [place];  Service: General;  Laterality: N/A;  . COLONOSCOPY     Hx: of  . INSERTION OF DIALYSIS CATHETER  05/22/2012   Procedure: INSERTION OF DIALYSIS CATHETER;  Surgeon: Mal Misty, MD;  Location: [place];  Service: Vascular;  Laterality: N/A;  right internal jugular vein  . IR ANGIO INTRA EXTRACRAN SEL COM CAROTID INNOMINATE BILAT MOD SED  10/28/2018  . IR ANGIO VERTEBRAL SEL  SUBCLAVIAN INNOMINATE UNI R MOD SED  10/28/2018  . IR ANGIO VERTEBRAL SEL VERTEBRAL UNI L MOD SED  10/28/2018  . LIGATION OF ARTERIOVENOUS  FISTULA  05/22/2012   Procedure: LIGATION OF ARTERIOVENOUS  FISTULA;  Surgeon: Mal Misty, MD;  Location: [name];  Service: Vascular;  Laterality: Left;  Left brachio-cephalic fistula  . LOWER EXTREMITY ANGIOGRAM N/A 06/14/2013   Procedure: LOWER EXTREMITY ANGIOGRAM;  Surgeon: Angelia Mould, MD;  Location: [facility] CATH LAB;  Service: Cardiovascular;  Laterality: N/A;  . NEPHRECTOMY TRANSPLANTED ORGAN    . OTHER SURGICAL HISTORY     Cyst removed from back  . PORTACATH PLACEMENT  2002  . RADIOLOGY WITH ANESTHESIA N/A 10/28/2018   Procedure: IR WITH ANESTHESIA;  Surgeon: Radiologist, Medication, MD;  Location: [place];  Service: Radiology;  Laterality: N/A;  . REMOVAL OF A DIALYSIS CATHETER    . RENAL BIOPSY, PERCUTANEOUS  2012  . TONSILLECTOMY    . VASCULAR SURGERY    . WHIPPLE PROCEDURE  2002   duodenal ca, Dr. Harlow Asa    Family History  Problem Relation Age of Onset  . Cancer Mother   . Cancer Father        prostate cancer  . Heart disease Maternal Grandfather   . Stroke Paternal Grandmother     Social History   Socioeconomic History  . Marital status: Married    Spouse name: Not on file  . Number of children: Not on file  . Years of education: Not on file  . Highest education level: Not on file  Occupational History  . Not on file  Tobacco Use  . Smoking status: Never Smoker  . Smokeless tobacco: Never Used  Vaping Use  . Vaping Use: Never used  Substance and Sexual Activity  . Alcohol use: No  . Drug use: No  . Sexual activity: Not on file  Other Topics Concern  . Not on file  Social History Narrative  . Not on file   Social Determinants of Health   Financial Resource Strain:   . Difficulty of Paying Living Expenses:   Food Insecurity:   . Worried About Charity fundraiser in the Last Year:   . Arboriculturist in the Last  Year:   Transportation Needs:   . Film/video editor (Medical):   [name] Lack of Transportation (Non-Medical):   Physical Activity:   . Days of Exercise per Week:   . Minutes of Exercise per Session:   Stress:   . Feeling of Stress :   Social Connections:   . Frequency of Communication with Friends and Family:   . Frequency of Social Gatherings with Friends and Family:   . Attends Religious Services:   .  Active Member of Clubs or Organizations:   . Attends Banker Meetings:   [name] Marital Status:   Intimate Partner Violence:   . Fear of Current or Ex-Partner:   . Emotionally Abused:   [name] Physically Abused:   . Sexually Abused:      Physical Exam   Vitals:   12/31/19 0637 12/31/19 0850  BP: 133/80 122/62  Pulse: 75 69  Resp: 19 19  Temp:  98.1 F (36.7 C)  SpO2: 98% 99%    CONSTITUTIONAL: Chronically ill-appearing, NAD NEURO:  Alert and oriented x 3, no focal deficits EYES:  eyes equal and reactive ENT/NECK:  no LAD, no JVD CARDIO: Regular rate, well-perfused, normal S1 and S2 PULM:  CTAB no wheezing or rhonchi GI/GU:  normal bowel sounds, non-distended, non-tender MSK/SPINE: Bilateral below the knee amputations, right stump with increased warmth, erythema, fluctuance SKIN:  no rash, atraumatic PSYCH:  Appropriate speech and behavior  *Additional and/or pertinent findings included in MDM below  Diagnostic and Interventional Summary    EKG Interpretation  Date/Time:  Friday December 31 2019 11:20:15 EDT Ventricular Rate:  67 PR Interval:    QRS Duration: 102 QT Interval:  459 QTC Calculation: 485 R Axis:   2 Text Interpretation: Sinus rhythm Ventricular premature complex Probable anterior infarct, old Confirmed by Kennis Carina [phone]) on 12/31/2019 12:22:34 PM      Labs Reviewed  COMPREHENSIVE METABOLIC PANEL - Abnormal; Notable for the following components:      Result Value   Chloride 121 (*)    CO2 7 (*)    Glucose, Bld 269 (*)    BUN 80 (*)     Creatinine, Ser 4.76 (*)    Calcium 8.1 (*)    Total Protein 5.7 (*)    Albumin 2.6 (*)    AST 10 (*)    GFR calc non Af Amer 12 (*)    GFR calc Af Amer 14 (*)    All other components within normal limits  CBC WITH DIFFERENTIAL/PLATELET - Abnormal; Notable for the following components:   WBC 13.3 (*)    RBC 3.70 (*)    Hemoglobin 10.4 (*)    HCT 34.2 (*)    Neutro Abs 10.1 (*)    Abs Immature Granulocytes 0.13 (*)    All other components within normal limits  BASIC METABOLIC PANEL - Abnormal; Notable for the following components:   Chloride 119 (*)    CO2 <7 (*)    Glucose, Bld 254 (*)    BUN 92 (*)    Creatinine, Ser 5.25 (*)    Calcium 8.4 (*)    GFR calc non Af Amer 11 (*)    GFR calc Af Amer 12 (*)    All other components within normal limits  SARS CORONAVIRUS 2 BY RT PCR (HOSPITAL ORDER, PERFORMED IN [facility] [place] [facility])  LACTIC ACID, PLASMA  URINALYSIS, ROUTINE W REFLEX MICROSCOPIC    US Renal Transplant w/Doppler    (Results Pending)  CT EXTREMITY LOWER RIGHT WO CONTRAST    (Results Pending)    Medications  sodium bicarbonate 150 mEq in dextrose 5% 1000 mL infusion (has no administration in time range)     Procedures  /  Critical Care .Critical Care Performed by: Sabas Sous, MD Authorized by: Sabas Sous, MD   Critical care provider statement:    Critical care time (minutes):  45   Critical care was necessary to treat or prevent imminent or life-threatening deterioration  of the following conditions:  Metabolic crisis (Acute renal failure)   Critical care was time spent personally by me on the following activities:  Discussions with consultants, evaluation of patient's response to treatment, examination of patient, ordering and performing treatments and interventions, ordering and review of laboratory studies, ordering and review of radiographic studies, pulse oximetry, re-evaluation of patient's condition, obtaining history from patient or  surrogate and review of old charts    ED Course and Medical Decision Making  I have reviewed the triage vital signs, the nursing notes, and pertinent available records from the EMR.  Listed above are laboratory and imaging tests that I personally ordered, reviewed, and interpreted and then considered in my medical decision making (see below for details).  Concern for stump infection, question of osteomyelitis versus underlying abscess.  Afebrile, normal vital signs.  Labs also revealing significant acidosis and suspect worsening renal dysfunction, history of kidney transplant, currently not on dialysis.  Will obtain ultrasound of the transplanted kidney and consult nephrology.  Suspect patient will need admission.     Repeat labs demonstrate even worsening acidosis and renal function, nephrology is consulted and will come see the patient, will admit to hospitalist service.  Barth Kirks. Sedonia Small, Bernie [email or web address]  Final Clinical Impressions(s) / ED Diagnoses     ICD-10-CM   1. Amputation stump infection ([place])  T87.40   2. Acute renal failure, unspecified acute renal failure type ([place])  N17.9   3. History of renal transplant  Z94.0     ED Discharge Orders    None       Discharge Instructions Discussed with and Provided to Patient:   Discharge Instructions   None       Maudie Flakes, MD 12/31/19 1225

## 2019-12-31 NOTE — Consult Note (Signed)
Reason for Consult:  Renal failure Referring Physician:  Dr. Sedonia Small  Chief Complaint:  Cellulitis  Assessment/Plan: 1. Acute on CKD4 BL cr 2.8-3.4 s/p DDRT with DGF requiring HD x3 as well as subsequent ACR and antibody mediated rejection requiring IVIG as recently as 2018. Renal function  appears to be progressively worsening over the past year as well. He appears very dry and I am suspicious that he has not been taking the Creon as well and diarrhea is likely underreported + not taking high dose HCO3 for a week -> severe gap metabolic acidosis as well as AKI on top of already severely compromised renal function. - Will challenge with IV HCO3 and restart oral HCO3 as well. - Isotonic fluids to fluids resuscitate; his BP already usually runs low. - Agree with renal transplant ultrasound but obstruction lower on differential. - Hold the Demadex for now. 2. DM - poorly controlled. Per primary. 3. HTN - BP actually on low side at this time. 4. Right leg cellulitis r/o abscess/ pustule? 5. Pancreatic insufficiency - should be on Creon 12K units with each meal or snack.   HPI: Paul [name] is an 64 y.o. male  DDRT 55 CKD4, AoCKD Afib DM HTN b/l BKA h/o duodenal adenocarcinoma s/p resection. He's already had a allograft biopsy which showed interstitial fibrosis and e/o calcineurin induced nephrotoxcity. He is currently on Myfortic , Tac and prednisone. Baseline Cr is 2.8-3.4 (as recently as 10/12/2019) but appears to be slowly worsening as an outpatient.  Patient is here with leg pain and states that the pain to the right leg stump has worsened over the past few days but denies any fever. Pain described as worse with pressure or motion. He denies shortness of breath, abd pain, n/v/ syncopal episodes. He stopped taking his medications about a week ago and of note he is on HCO3 3 tabs 4x a day. He denies diarrhea but has had markedly decreased PO intake, no sick contacts, no myalgias, dizziness, syncopal  episodes or visual disturbances. He has also not taken his transplant medications in about a week and has a history of poor adherence for which he is on a "observation/probation period" prior to relisting for kidney transplant.  ROS Pertinent items are noted in HPI.  Chemistry and CBC: Creatinine, Ser  Date/Time Value Ref Range Status  12/31/2019 11:15 AM 5.25 (H) 0.61 - 1.24 mg/dL Final  12/30/2019 06:19 PM 4.76 (H) 0.61 - 1.24 mg/dL Final  10/12/2019 12:11 AM 3.59 (H) 0.61 - 1.24 mg/dL Final  04/29/2019 01:56 AM 3.72 (H) 0.61 - 1.24 mg/dL Final  04/28/2019 02:11 AM 3.48 (H) 0.61 - 1.24 mg/dL Final  04/27/2019 03:52 AM 3.52 (H) 0.61 - 1.24 mg/dL Final  04/26/2019 03:37 AM 3.79 (H) 0.61 - 1.24 mg/dL Final  04/25/2019 03:42 AM 3.94 (H) 0.61 - 1.24 mg/dL Final  04/24/2019 05:20 AM 3.96 (H) 0.61 - 1.24 mg/dL Final  04/23/2019 06:08 PM 4.25 (H) 0.61 - 1.24 mg/dL Final  11/04/2018 08:58 AM 3.68 (H) 0.61 - 1.24 mg/dL Final  11/03/2018 06:14 AM 3.97 (H) 0.61 - 1.24 mg/dL Final  11/02/2018 06:02 AM 4.10 (H) 0.61 - 1.24 mg/dL Final  11/01/2018 05:30 AM 3.63 (H) 0.61 - 1.24 mg/dL Final  10/31/2018 05:33 AM 3.79 (H) 0.61 - 1.24 mg/dL Final  10/29/2018 05:30 AM 3.30 (H) 0.61 - 1.24 mg/dL Final  10/28/2018 09:42 PM 3.15 (H) 0.61 - 1.24 mg/dL Final  10/28/2018 01:27 PM 3.31 (H) 0.61 - 1.24 mg/dL Final  05/25/2018  11:52 AM 4.15 (H) 0.61 - 1.24 mg/dL Final  05/24/2018 03:31 AM 4.71 (H) 0.61 - 1.24 mg/dL Final  05/24/2018 01:00 AM 4.88 (H) 0.61 - 1.24 mg/dL Final  05/23/2018 03:17 PM 4.72 (H) 0.61 - 1.24 mg/dL Final  10/28/2016 07:00 PM 4.09 (H) 0.61 - 1.24 mg/dL Final  08/02/2014 12:05 PM 5.23 (H) 0.50 - 1.35 mg/dL Final  08/14/2013 07:15 AM 4.49 (H) 0.50 - 1.35 mg/dL Final  08/12/2013 04:31 PM 5.63 (H) 0.50 - 1.35 mg/dL Final  08/10/2013 03:14 PM 5.90 (H) 0.50 - 1.35 mg/dL Final  08/07/2013 04:42 PM 5.31 (H) 0.50 - 1.35 mg/dL Final  08/05/2013 04:45 PM 6.03 (H) 0.50 - 1.35 mg/dL Final   08/03/2013 12:27 PM 6.25 (H) 0.50 - 1.35 mg/dL Final  07/30/2013 02:02 PM 4.06 (H) 0.50 - 1.35 mg/dL Final  07/13/2013 05:35 AM 7.73 (H) 0.50 - 1.35 mg/dL Final  07/12/2013 06:45 AM 6.28 (H) 0.50 - 1.35 mg/dL Final  07/11/2013 08:31 AM 4.70 (H) 0.50 - 1.35 mg/dL Final  07/10/2013 03:58 AM 6.67 (H) 0.50 - 1.35 mg/dL Final  07/09/2013 05:01 PM 5.76 (H) 0.50 - 1.35 mg/dL Final  06/14/2013 06:27 AM 6.20 (H) 0.50 - 1.35 mg/dL Final  06/10/2013 05:10 PM 7.17 (H) 0.50 - 1.35 mg/dL Final  06/08/2013 04:02 PM 8.42 (H) 0.50 - 1.35 mg/dL Final  06/05/2013 05:15 AM 6.04 (H) 0.50 - 1.35 mg/dL Final  06/01/2013 05:23 PM 8.77 (H) 0.50 - 1.35 mg/dL Final  05/31/2013 05:16 AM 6.27 (H) 0.50 - 1.35 mg/dL Final    Comment:    DELTA CHECK NOTED  05/30/2013 03:16 AM 3.97 (H) 0.50 - 1.35 mg/dL Final    Comment:    REPEATED TO VERIFY  05/29/2013 02:19 PM 7.49 (H) 0.50 - 1.35 mg/dL Final  05/27/2013 02:48 PM 8.94 (H) 0.50 - 1.35 mg/dL Final    Comment:    DELTA CHECK NOTED  05/26/2013 04:22 AM 5.67 (H) 0.50 - 1.35 mg/dL Final    Comment:    DELTA CHECK NOTED  05/25/2013 03:14 AM 10.65 (H) 0.50 - 1.35 mg/dL Final  05/24/2013 11:57 AM 10.02 (H) 0.50 - 1.35 mg/dL Final  05/15/2013 11:00 PM 9.90 (H) 0.50 - 1.35 mg/dL Final  04/16/2013 01:01 PM 9.23 (H) 0.50 - 1.35 mg/dL Final  04/04/2013 04:36 AM 10.64 (H) 0.50 - 1.35 mg/dL Final  04/03/2013 02:25 AM 10.13 (H) 0.50 - 1.35 mg/dL Final   Recent Labs  Lab 12/30/19 1819 12/31/19 1115  NA 141 142  K 3.9 3.7  CL 121* 119*  CO2 7* <7*  GLUCOSE 269* 254*  BUN 80* 92*  CREATININE 4.76* 5.25*  CALCIUM 8.1* 8.4*   Recent Labs  Lab 12/30/19 1819  WBC 13.3*  NEUTROABS 10.1*  HGB 10.4*  HCT 34.2*  MCV 92.4  PLT 161   Liver Function Tests: Recent Labs  Lab 12/30/19 1819  AST 10*  ALT 11  ALKPHOS 59  BILITOT 0.7  PROT 5.7*  ALBUMIN 2.6*   No results for input(s): LIPASE, AMYLASE in the last 168 hours. No results for input(s): AMMONIA in the  last 168 hours. Cardiac Enzymes: No results for input(s): CKTOTAL, CKMB, CKMBINDEX, TROPONINI in the last 168 hours. Iron Studies: No results for input(s): IRON, TIBC, TRANSFERRIN, FERRITIN in the last 72 hours. PT/INR: '@LABRCNTIP'$ (inr:5)  Xrays/Other Studies: ) Results for orders placed or performed during the hospital encounter of 12/30/19 (from the past 48 hour(s))  Comprehensive metabolic panel     Status: Abnormal   Collection  Time: 12/30/19  6:19 PM  Result Value Ref Range   Sodium 141 135 - 145 mmol/L   Potassium 3.9 3.5 - 5.1 mmol/L   Chloride 121 (H) 98 - 111 mmol/L   CO2 7 (L) 22 - 32 mmol/L   Glucose, Bld 269 (H) 70 - 99 mg/dL    Comment: Glucose reference range applies only to samples taken after fasting for at least 8 hours.   BUN 80 (H) 8 - 23 mg/dL   Creatinine, Ser 4.76 (H) 0.61 - 1.24 mg/dL   Calcium 8.1 (L) 8.9 - 10.3 mg/dL   Total Protein 5.7 (L) 6.5 - 8.1 g/dL   Albumin 2.6 (L) 3.5 - 5.0 g/dL   AST 10 (L) 15 - 41 U/L   ALT 11 0 - 44 U/L   Alkaline Phosphatase 59 38 - 126 U/L   Total Bilirubin 0.7 0.3 - 1.2 mg/dL   GFR calc non Af Amer 12 (L) >60 mL/min   GFR calc Af Amer 14 (L) >60 mL/min   Anion gap 13 5 - 15    Comment: Performed at [facility] [address] [place], [place] [address]  CBC with Differential     Status: Abnormal   Collection Time: 12/30/19  6:19 PM  Result Value Ref Range   WBC 13.3 (H) 4.0 - 10.5 K/uL   RBC 3.70 (L) 4.22 - 5.81 MIL/uL   Hemoglobin 10.4 (L) 13.0 - 17.0 g/dL   HCT 34.2 (L) 39 - 52 %   MCV 92.4 80.0 - 100.0 fL   MCH 28.1 26.0 - 34.0 pg   MCHC 30.4 30.0 - 36.0 g/dL   RDW 15.2 11.5 - 15.5 %   Platelets 161 150 - 400 K/uL   nRBC 0.0 0.0 - 0.2 %   Neutrophils Relative % 75 %   Neutro Abs 10.1 (H) 1.7 - 7.7 K/uL   Lymphocytes Relative 13 %   Lymphs Abs 1.7 0.7 - 4.0 K/uL   Monocytes Relative 8 %   Monocytes Absolute 1.0 0 - 1 K/uL   Eosinophils Relative 2 %   Eosinophils Absolute 0.3 0 - 0 K/uL   Basophils  Relative 1 %   Basophils Absolute 0.1 0 - 0 K/uL   Immature Granulocytes 1 %   Abs Immature Granulocytes 0.13 (H) 0.00 - 0.07 K/uL    Comment: Performed at [facility] [address], [place], [place] [address]  Lactic acid, plasma     Status: None   Collection Time: 12/30/19  6:22 PM  Result Value Ref Range   Lactic Acid, Venous 0.5 0.5 - 1.9 mmol/L    Comment: Performed at [facility] [address], [place], [place] [address]  Basic metabolic panel     Status: Abnormal   Collection Time: 12/31/19 11:15 AM  Result Value Ref Range   Sodium 142 135 - 145 mmol/L   Potassium 3.7 3.5 - 5.1 mmol/L   Chloride 119 (H) 98 - 111 mmol/L   CO2 <7 (L) 22 - 32 mmol/L   Glucose, Bld 254 (H) 70 - 99 mg/dL    Comment: Glucose reference range applies only to samples taken after fasting for at least 8 hours.   BUN 92 (H) 8 - 23 mg/dL   Creatinine, Ser 5.25 (H) 0.61 - 1.24 mg/dL   Calcium 8.4 (L) 8.9 - 10.3 mg/dL   GFR calc non Af Amer 11 (L) >60 mL/min   GFR calc Af Wyvonnia Lora  12 (L) >60 mL/min   Anion gap NOT CALCULATED 5 - 15    Comment: Performed at [facility] [address], [place], [place] [address]   No results found.  PMH:   Past Medical History:  Diagnosis Date  . A-fib ([place] [facility])   . Anemia    esrd  . Calciphylaxis 05/2013   lower ext  . Cancer ([place])    hx duodenal adenoCa 2002, resected  . Closed left arm fracture 1967  . Corneal abrasion, left   . Depression   . Diabetes mellitus    Type 1 iddm x 11 yrs  . Diabetic neuropathy ([place])   . ESRD on hemodialysis ([place])    Pt has ESRD due to DM.  He had a L upper arm AVF prior to starting HD which was ligated due to L arm steal syndrome.  He started HD in Jan 2014 and did home HD.  The family couldn't cannulate the R arm AVF successfully so by mid 2014 they decided to switch to PD which was done late summer 2014.  In Jan 2015 he was admitted with FTT felt to be due to underdialysis on PD and PD was abandoned and he   .  GERD (gastroesophageal reflux disease)   . Glomerulosclerosis, diabetic ([facility])   . Heart murmur   . History of blood transfusion   . Hypertension    off of meds due to orthostatic hypotension  . Kidney transplant recipient   . Open wound of both legs with complication    Pt has had progressive wounds of both LE's including gangrene of the toes and patchy necrosis of the calves.  He has been treated at [facility] by Dr Jerline Pain with hyperbaric O2 5d per week.  He is getting Na Thiosulfate with HD for suspected calciphylaxis. Pt says doctor's aren't sure if these ulcers were diabetic ulcers or calciphylaxis.  He underwent bilat BKA on 07/30/13.  He says that the woun    PSH:   Past Surgical History:  Procedure Laterality Date  . AMPUTATION Bilateral 07/30/2013   Procedure: AMPUTATION BELOW KNEE;  Surgeon: Newt Minion, MD;  Location: [facility];  Service: Orthopedics;  Laterality: Bilateral;  Bilateral Below Knee Amputations  . AMPUTATION Bilateral 09/03/2013   Procedure: AMPUTATION BELOW KNEE;  Surgeon: Newt Minion, MD;  Location: [facility];  Service: Orthopedics;  Laterality: Bilateral;  Bilateral Below Knee Amputation Revision  . AV FISTULA PLACEMENT  06/07/2011   Procedure: ARTERIOVENOUS (AV) FISTULA CREATION;  Surgeon: Angelia Mould, MD;  Location: [facility];  Service: Vascular;  Laterality: Left;  . AV FISTULA PLACEMENT  06/18/2012   Procedure: ARTERIOVENOUS (AV) FISTULA CREATION;  Surgeon: Mal Misty, MD;  Location: [facility];  Service: Vascular;  Laterality: Right;  [place]  . BILIARY DIVERSION, EXTERNAL  2002  . BONE MARROW BIOPSY  2012  . CAPD INSERTION N/A 01/14/2013   Procedure: LAPAROSCOPIC INSERTION CONTINUOUS AMBULATORY PERITONEAL DIALYSIS  (CAPD) CATHETER;  Surgeon: Adin Hector, MD;  Location: [facility];  Service: General;  Laterality: N/A;  . CAPD REMOVAL N/A 07/09/2013   Procedure: CONTINUOUS AMBULATORY PERITONEAL DIALYSIS  (CAPD) CATHETER REMOVAL;  Surgeon: Adin Hector, MD;   Location: [facility];  Service: General;  Laterality: N/A;  . COLONOSCOPY     Hx: of  . INSERTION OF DIALYSIS CATHETER  05/22/2012   Procedure: INSERTION OF DIALYSIS CATHETER;  Surgeon: Mal Misty, MD;  Location: [facility];  Service: Vascular;  Laterality: N/A;  right internal jugular vein  . IR ANGIO INTRA EXTRACRAN SEL COM CAROTID INNOMINATE BILAT MOD SED  10/28/2018  . IR ANGIO VERTEBRAL SEL SUBCLAVIAN INNOMINATE UNI R MOD SED  10/28/2018  . IR ANGIO VERTEBRAL SEL VERTEBRAL UNI L MOD SED  10/28/2018  . LIGATION OF ARTERIOVENOUS  FISTULA  05/22/2012   Procedure: LIGATION OF ARTERIOVENOUS  FISTULA;  Surgeon: Mal Misty, MD;  Location: [facility];  Service: Vascular;  Laterality: Left;  Left brachio-cephalic fistula  . LOWER EXTREMITY ANGIOGRAM N/A 06/14/2013   Procedure: LOWER EXTREMITY ANGIOGRAM;  Surgeon: Angelia Mould, MD;  Location: [facility] CATH LAB;  Service: Cardiovascular;  Laterality: N/A;  . NEPHRECTOMY TRANSPLANTED ORGAN    . OTHER SURGICAL HISTORY     Cyst removed from back  . PORTACATH PLACEMENT  2002  . RADIOLOGY WITH ANESTHESIA N/A 10/28/2018   Procedure: IR WITH ANESTHESIA;  Surgeon: Radiologist, Medication, MD;  Location: [facility];  Service: Radiology;  Laterality: N/A;  . REMOVAL OF A DIALYSIS CATHETER    . RENAL BIOPSY, PERCUTANEOUS  2012  . TONSILLECTOMY    . VASCULAR SURGERY    . WHIPPLE PROCEDURE  2002   duodenal ca, Dr. Harlow Asa    Allergies:  Allergies  Allergen Reactions  . Metformin And Related Other (See Comments)    Has kidney problems    Medications:   Prior to Admission medications   Medication Sig Start Date End Date Taking? Authorizing Provider  amLODipine (NORVASC) 10 MG tablet Take 10 mg by mouth daily.    [provider]  aspirin EC 81 MG tablet Take 81 mg by mouth daily.    [provider]  atorvastatin (LIPITOR) 10 MG tablet Take 1 tablet (10 mg total) by mouth daily. Patient taking differently: Take 10 mg by mouth every Monday, Wednesday,  and Friday.  08/13/13   Angiulli, Lavon Paganini, PA-C  calcitRIOL (ROCALTROL) 0.25 MCG capsule Take 0.25 mcg by mouth daily.     [provider]  calcium acetate (PHOSLO) 667 MG capsule Take 1,334 mg by mouth 3 (three) times daily with meals.    [provider]  CVS D3 50 MCG (2000 UT) CAPS Take 4,000 Units by mouth daily. 09/18/18   [provider]  ELIQUIS 5 MG TABS tablet TAKE 1 TABLET BY MOUTH TWICE A DAY Patient taking differently: Take 5 mg by mouth 2 (two) times daily.  07/13/19   O'NealCassie Freer, MD  furosemide (LASIX) 80 MG tablet Take 80 mg by mouth 2 (two) times daily. 08/09/19   [provider]  HUMALOG KWIKPEN 100 UNIT/ML KwikPen Inject 4-8 Units into the skin daily. Sliding Scale 01/11/19   [provider]  hydrALAZINE (APRESOLINE) 100 MG tablet Take 100 mg by mouth 2 (two) times daily. 08/19/19   [provider]  Insulin Glargine (BASAGLAR KWIKPEN) 100 UNIT/ML SOPN Inject 10 Units into the skin daily.  01/11/19   [provider]  magnesium chloride (SLOW-MAG) 64 MG TBEC SR tablet Take 2 tablets by mouth 2 (two) times daily. TAKE AT LEAST 2 HOURS SEPARATE FROM PROGRAF/TACROLIMUS 10/02/14   [provider]  mycophenolate (MYFORTIC) 180 MG EC tablet Take 360 mg by mouth 2 (two) times daily.     [provider]  omeprazole (PRILOSEC) 40 MG capsule Take 40 mg by mouth at bedtime.  01/11/14   [provider]  predniSONE (DELTASONE) 5 MG tablet Take 5 mg by mouth daily with breakfast.  [provider]  sertraline (ZOLOFT) 50 MG tablet Take 50 mg by mouth daily. 08/15/19   [provider]  sodium bicarbonate 650 MG tablet Take 1,950 mg by mouth 4 (four) times daily. 09/21/18   [provider]  tacrolimus (PROGRAF) 1 MG capsule Take 2-3 mg by mouth See admin instructions. Take 3 mg by mouth in the morning at 9 AM and 2 mg in the evening at 9 PM    [provider]  tamsulosin  (FLOMAX) 0.4 MG CAPS capsule Take 0.4 mg by mouth daily after supper.     [provider]  torsemide (DEMADEX) 20 MG tablet Take 2 tablets (40 mg total) by mouth daily. Patient not taking: Reported on 10/12/2019 04/29/19 05/29/19  Arrien, Jimmy Picket, MD    Discontinued Meds:  There are no discontinued medications.  Social History:  reports that he has never smoked. He has never used smokeless tobacco. He reports that he does not drink alcohol and does not use drugs.  Family History:   Family History  Problem Relation Age of Onset  . Cancer Mother   . Cancer Father        prostate cancer  . Heart disease Maternal Grandfather   . Stroke Paternal Grandmother     Blood pressure 122/62, pulse 69, temperature 98.1 F (36.7 C), temperature source Oral, resp. rate 19, height 6' (1.829 m), weight 80.7 kg, SpO2 99 %. General appearance: alert, cooperative and appears stated age Head: Normocephalic, without obvious abnormality, atraumatic Eyes: negative Neck: no adenopathy, no carotid bruit, no JVD, supple, symmetrical, trachea midline and thyroid not enlarged, symmetric, no tenderness/mass/nodules Back: symmetric, no curvature. ROM normal. No CVA tenderness. Resp: clear to auscultation bilaterally Chest wall: no tenderness Cardio: regular rate and rhythm GI: soft, non-tender; bowel sounds normal; no masses,  no organomegaly Extremities: b/l BKA, right stump erythematous, pustule and TTP Pulses: 1+ Access: rt [place]im[place] with strong bruit       Dwana Melena, MD 12/31/2019, 12:25 PM

## 2019-12-31 NOTE — H&P (Signed)
History and Physical    Paul [name] ZLD:[id number] DOB: 1955-06-08 DOA: 12/30/2019  PCP: [name], MD (Confirm with patient/family/NH records and if not entered, this has to be entered at [facility] point of entry) Patient coming from: Home  I have personally briefly reviewed patient's old medical records in [place]  Chief Complaint: Leg pain  HPI: Paul [name] is a 64 y.o. male with medical history significant of kidney transplant recipient (2016) with CKD stage IV, hypertension, IDDM, chronic A. fib on Xarelto, B/L BKA secondary to PVD, presented with new onset of right BKA stump pain and rash.  Symptoms started about 1 week ago, patient attributed that to a unfit prosthetic which might caused a skin abrasions.  He started to notice significant pain rash associated the end of right leg stump about 3 days ago.  Pain has been constant and gradually getting worse, denies any fever chills.  He took some Tylenol for the pain without any relief.  Patient has been following with [facility] for the kidney transplant, he was seen in the office about 2 weeks ago and multiple blood work and urine analysis sent, urine protein/Cre>5,000, patient however was not informed about results yet.  And patient claims his kidney function has been stable, denies any back pain, decreased urine output or urine color. ED Course: Cre 5.2 compared to 3.5 at baseline, bicarb less than 7 compared to 12 last week, WBC 13.3, glucose 254.  Suspicion for early acute osteomyelitis involve the distal tibia, distal fluid collection hematoma versus abscess.  Review of Systems: As per HPI otherwise 14 point review of systems negative.    Past Medical History:  Diagnosis Date  . A-fib ([place])   . Anemia    esrd  . Calciphylaxis 05/2013   lower ext  . Cancer ([place])    hx duodenal adenoCa 2002, resected  . Closed left arm fracture 1967  . Corneal abrasion, left   . Depression   . Diabetes mellitus    Type  1 iddm x 11 yrs  . Diabetic neuropathy ([place])   . ESRD on hemodialysis ([place])    Pt has ESRD due to DM.  He had a L upper arm AVF prior to starting HD which was ligated due to L arm steal syndrome.  He started HD in Jan 2014 and did home HD.  The family couldn't cannulate the R arm AVF successfully so by mid 2014 they decided to switch to PD which was done late summer 2014.  In Jan 2015 he was admitted with FTT felt to be due to underdialysis on PD and PD was abandoned and he   . GERD (gastroesophageal reflux disease)   . Glomerulosclerosis, diabetic ([place])   . Heart murmur   . History of blood transfusion   . Hypertension    off of meds due to orthostatic hypotension  . Kidney transplant recipient   . Open wound of both legs with complication    Pt has had progressive wounds of both LE's including gangrene of the toes and patchy necrosis of the calves.  He has been treated at [facility] by Dr Jerline Pain with hyperbaric O2 5d per week.  He is getting Na Thiosulfate with HD for suspected calciphylaxis. Pt says doctor's aren't sure if these ulcers were diabetic ulcers or calciphylaxis.  He underwent bilat BKA on 07/30/13.  He says that the woun    Past Surgical History:  Procedure Laterality Date  . AMPUTATION Bilateral  07/30/2013   Procedure: AMPUTATION BELOW KNEE;  Surgeon: Newt Minion, MD;  Location: [place];  Service: Orthopedics;  Laterality: Bilateral;  Bilateral Below Knee Amputations  . AMPUTATION Bilateral 09/03/2013   Procedure: AMPUTATION BELOW KNEE;  Surgeon: Newt Minion, MD;  Location: [place];  Service: Orthopedics;  Laterality: Bilateral;  Bilateral Below Knee Amputation Revision  . AV FISTULA PLACEMENT  06/07/2011   Procedure: ARTERIOVENOUS (AV) FISTULA CREATION;  Surgeon: Angelia Mould, MD;  Location: [place];  Service: Vascular;  Laterality: Left;  . AV FISTULA PLACEMENT  06/18/2012   Procedure: ARTERIOVENOUS (AV) FISTULA CREATION;  Surgeon: Mal Misty, MD;  Location: [place];   Service: Vascular;  Laterality: Right;  [place]  . BILIARY DIVERSION, EXTERNAL  2002  . BONE MARROW BIOPSY  2012  . CAPD INSERTION N/A 01/14/2013   Procedure: LAPAROSCOPIC INSERTION CONTINUOUS AMBULATORY PERITONEAL DIALYSIS  (CAPD) CATHETER;  Surgeon: Adin Hector, MD;  Location: [place];  Service: General;  Laterality: N/A;  . CAPD REMOVAL N/A 07/09/2013   Procedure: CONTINUOUS AMBULATORY PERITONEAL DIALYSIS  (CAPD) CATHETER REMOVAL;  Surgeon: Adin Hector, MD;  Location: [place];  Service: General;  Laterality: N/A;  . COLONOSCOPY     Hx: of  . INSERTION OF DIALYSIS CATHETER  05/22/2012   Procedure: INSERTION OF DIALYSIS CATHETER;  Surgeon: Mal Misty, MD;  Location: [place];  Service: Vascular;  Laterality: N/A;  right internal jugular vein  . IR ANGIO INTRA EXTRACRAN SEL COM CAROTID INNOMINATE BILAT MOD SED  10/28/2018  . IR ANGIO VERTEBRAL SEL SUBCLAVIAN INNOMINATE UNI R MOD SED  10/28/2018  . IR ANGIO VERTEBRAL SEL VERTEBRAL UNI L MOD SED  10/28/2018  . LIGATION OF ARTERIOVENOUS  FISTULA  05/22/2012   Procedure: LIGATION OF ARTERIOVENOUS  FISTULA;  Surgeon: Mal Misty, MD;  Location: [place];  Service: Vascular;  Laterality: Left;  Left brachio-cephalic fistula  . LOWER EXTREMITY ANGIOGRAM N/A 06/14/2013   Procedure: LOWER EXTREMITY ANGIOGRAM;  Surgeon: Angelia Mould, MD;  Location: [facility] CATH LAB;  Service: Cardiovascular;  Laterality: N/A;  . NEPHRECTOMY TRANSPLANTED ORGAN    . OTHER SURGICAL HISTORY     Cyst removed from back  . PORTACATH PLACEMENT  2002  . RADIOLOGY WITH ANESTHESIA N/A 10/28/2018   Procedure: IR WITH ANESTHESIA;  Surgeon: Radiologist, Medication, MD;  Location: [place];  Service: Radiology;  Laterality: N/A;  . REMOVAL OF A DIALYSIS CATHETER    . RENAL BIOPSY, PERCUTANEOUS  2012  . TONSILLECTOMY    . VASCULAR SURGERY    . WHIPPLE PROCEDURE  2002   duodenal ca, Dr. Harlow Asa     reports that he has never smoked. He has never used smokeless tobacco. He reports that  he does not drink alcohol and does not use drugs.  Allergies  Allergen Reactions  . Metformin And Related Other (See Comments)    Has kidney problems    Family History  Problem Relation Age of Onset  . Cancer Mother   . Cancer Father        prostate cancer  . Heart disease Maternal Grandfather   . Stroke Paternal Grandmother      Prior to Admission medications   Medication Sig Start Date End Date Taking? Authorizing Provider  amLODipine (NORVASC) 10 MG tablet Take 10 mg by mouth daily.    [provider]  aspirin EC 81 MG tablet Take 81 mg by mouth daily.    [provider]  atorvastatin (  LIPITOR) 10 MG tablet Take 1 tablet (10 mg total) by mouth daily. Patient taking differently: Take 10 mg by mouth every Monday, Wednesday, and Friday.  08/13/13   Angiulli, Lavon Paganini, PA-C  calcitRIOL (ROCALTROL) 0.25 MCG capsule Take 0.25 mcg by mouth daily.     [provider]  calcium acetate (PHOSLO) 667 MG capsule Take 1,334 mg by mouth 3 (three) times daily with meals.    [provider]  CVS D3 50 MCG (2000 UT) CAPS Take 4,000 Units by mouth daily. 09/18/18   [provider]  ELIQUIS 5 MG TABS tablet TAKE 1 TABLET BY MOUTH TWICE A DAY Patient taking differently: Take 5 mg by mouth 2 (two) times daily.  07/13/19   O'NealCassie Freer, MD  furosemide (LASIX) 80 MG tablet Take 80 mg by mouth 2 (two) times daily. 08/09/19   [provider]  HUMALOG KWIKPEN 100 UNIT/ML KwikPen Inject 4-8 Units into the skin daily. Sliding Scale 01/11/19   [provider]  hydrALAZINE (APRESOLINE) 100 MG tablet Take 100 mg by mouth 2 (two) times daily. 08/19/19   [provider]  Insulin Glargine (BASAGLAR KWIKPEN) 100 UNIT/ML SOPN Inject 10 Units into the skin daily.  01/11/19   [provider]  magnesium chloride (SLOW-MAG) 64 MG TBEC SR tablet Take 2 tablets by mouth 2 (two) times daily. TAKE AT LEAST 2 HOURS SEPARATE FROM  PROGRAF/TACROLIMUS 10/02/14   [provider]  mycophenolate (MYFORTIC) 180 MG EC tablet Take 360 mg by mouth 2 (two) times daily.     [provider]  omeprazole (PRILOSEC) 40 MG capsule Take 40 mg by mouth at bedtime.  01/11/14   [provider]  predniSONE (DELTASONE) 5 MG tablet Take 5 mg by mouth daily with breakfast.     [provider]  sertraline (ZOLOFT) 50 MG tablet Take 50 mg by mouth daily. 08/15/19   [provider]  sodium bicarbonate 650 MG tablet Take 1,950 mg by mouth 4 (four) times daily. 09/21/18   [provider]  tacrolimus (PROGRAF) 1 MG capsule Take 2-3 mg by mouth See admin instructions. Take 3 mg by mouth in the morning at 9 AM and 2 mg in the evening at 9 PM    [provider]  tamsulosin (FLOMAX) 0.4 MG CAPS capsule Take 0.4 mg by mouth daily after supper.     [provider]  torsemide (DEMADEX) 20 MG tablet Take 2 tablets (40 mg total) by mouth daily. Patient not taking: Reported on 10/12/2019 04/29/19 05/29/19  Tawni Millers, MD    Physical Exam: Vitals:   12/31/19 0850 12/31/19 1119 12/31/19 1215 12/31/19 1300  BP: 122/62  (!) 145/65 (!) 143/67  Pulse: 69  74 74  Resp: _0 Temp: 98.1 F (36.7 C)     TempSrc: Oral     SpO2: 99%  100% 99%  Weight:  80.7 kg    Height:  6' (1.829 m)      Constitutional: NAD, calm, comfortable Vitals:   12/31/19 0850 12/31/19 1119 12/31/19 1215 12/31/19 1300  BP: 122/62  (!) 145/65 (!) 143/67  Pulse: 69  74 74  Resp: _1 Temp: 98.1 F (36.7 C)     TempSrc: Oral     SpO2: 99%  100% 99%  Weight:  80.7 kg    Height:  6' (1.829 m)     Eyes: PERRL, lids and conjunctivae normal ENMT: Mucous membranes are  dry. Posterior pharynx clear of any exudate or lesions.Normal dentition.  Neck: normal, supple, no masses, no thyromegaly Respiratory: clear to auscultation bilaterally, no wheezing, no crackles. Normal respiratory effort. No accessory  muscle use.  Cardiovascular: Regular rate and rhythm, no murmurs / rubs / gallops. No extremity edema. 2+ pedal pulses. No carotid bruits.  Abdomen: no tenderness, no masses palpated. No hepatosplenomegaly. Bowel sounds positive.  Musculoskeletal: Right distal BKA stump end rash and severe tenderness Skin: no rashes, lesions, ulcers. No induration Neurologic: CN 2-12 grossly intact. Sensation intact, DTR normal. Strength 5/5 in all 4.  Psychiatric: Normal judgment and insight. Alert and oriented x 3. Normal mood.      Labs on Admission: I have personally reviewed following labs and imaging studies  CBC: Recent Labs  Lab 12/30/19 1819  WBC 13.3*  NEUTROABS 10.1*  HGB 10.4*  HCT 34.2*  MCV 92.4  PLT 818   Basic Metabolic Panel: Recent Labs  Lab 12/30/19 1819 12/31/19 1115  NA 141 142  K 3.9 3.7  CL 121* 119*  CO2 7* <7*  GLUCOSE 269* 254*  BUN 80* 92*  CREATININE 4.76* 5.25*  CALCIUM 8.1* 8.4*   GFR: Estimated Creatinine Clearance: 15.2 mL/min (A) (by C-G formula based on SCr of 5.25 mg/dL (H)). Liver Function Tests: Recent Labs  Lab 12/30/19 1819  AST 10*  ALT 11  ALKPHOS 59  BILITOT 0.7  PROT 5.7*  ALBUMIN 2.6*   No results for input(s): LIPASE, AMYLASE in the last 168 hours. No results for input(s): AMMONIA in the last 168 hours. Coagulation Profile: No results for input(s): INR, PROTIME in the last 168 hours. Cardiac Enzymes: No results for input(s): CKTOTAL, CKMB, CKMBINDEX, TROPONINI in the last 168 hours. BNP (last 3 results) No results for input(s): PROBNP in the last 8760 hours. HbA1C: No results for input(s): HGBA1C in the last 72 hours. CBG: No results for input(s): GLUCAP in the last 168 hours. Lipid Profile: No results for input(s): CHOL, HDL, LDLCALC, TRIG, CHOLHDL, LDLDIRECT in the last 72 hours. Thyroid Function Tests: No results for input(s): TSH, T4TOTAL, FREET4, T3FREE, THYROIDAB in the last 72 hours. Anemia Panel: No results for  input(s): VITAMINB12, FOLATE, FERRITIN, TIBC, IRON, RETICCTPCT in the last 72 hours. Urine analysis:    Component Value Date/Time   COLORURINE YELLOW 10/12/2019 0603   APPEARANCEUR CLEAR 10/12/2019 0603   LABSPEC 1.011 10/12/2019 0603   PHURINE 5.0 10/12/2019 0603   GLUCOSEU 50 (A) 10/12/2019 0603   HGBUR NEGATIVE 10/12/2019 0603   BILIRUBINUR NEGATIVE 10/12/2019 0603   KETONESUR NEGATIVE 10/12/2019 0603   PROTEINUR >=300 (A) 10/12/2019 0603   UROBILINOGEN 0.2 08/02/2014 1424   NITRITE NEGATIVE 10/12/2019 0603   LEUKOCYTESUR NEGATIVE 10/12/2019 0603    Radiological Exams on Admission: CT EXTREMITY LOWER RIGHT WO CONTRAST  Result Date: 12/31/2019 CLINICAL DATA:  Pain and swelling at right BKA stump EXAM: CT OF THE LOWER RIGHT EXTREMITY WITHOUT CONTRAST TECHNIQUE: Multidetector CT imaging of the right lower extremity was performed according to the standard protocol. COMPARISON:  None available FINDINGS: Bones/Joint/Cartilage Patient is status post below-knee amputation of the right lower extremity. Subtle loss of the cortical definition of the distal amputation margin of the tibia (series 9, image 47; series 7, image 82). The residual fibula appears intact with preservation of the distal cortex. The patella and visualized distal femur appear intact. Bones are demineralized. No acute fracture. No dislocation. Joint spaces are relatively preserved. Small nonspecific knee joint effusion. Ligaments Suboptimally assessed by  CT. Collateral ligaments appear grossly intact. Muscles and Tendons No acute musculotendinous abnormality. Soft tissues Swelling and edema at the distal stump with possible cutaneous blister (series 8, image 74). There is a more focal slightly hyperdense collection at the posterolateral aspect of the distal stump measuring approximately 2.6 x 1.7 x 2.6 cm (series 4, image 54), which could represent a hematoma. No soft tissue gas. Extensive vascular calcifications. IMPRESSION: 1.  Status post below-knee amputation of the right lower extremity. Subtle loss of the cortical definition of the distal amputation margin of the tibia, raises the suspicion for early acute osteomyelitis. 2. Swelling and edema at the distal stump with possible cutaneous blister. There is a more focal hyperdense collection at the posterolateral aspect of the distal stump measuring approximately 2.6 x 1.7 x 2.6 cm, which could represent a hematoma. 3. Small nonspecific knee joint effusion. Electronically Signed   By: Davina Poke D.O.   On: 12/31/2019 13:23    EKG: Independently reviewed.  A. fib, no acute ST-T changes  Assessment/Plan Active Problems:   AKI (acute kidney injury) ([place])   Cellulitis  (please populate well all problems here in Problem List. (For example, if patient is on BP meds at home and you resume or decide to hold them, it is a problem that needs to be her. Same for CAD, COPD, HLD and so on)  Early sepsis secondary to right leg osteomyelitis and abscess on BKA stump site -Evidenced by elevated white count, worsening of kidney function/endorgan damage, image evidence of early osteomyelitis and/or abscess.  Discussed with pharmacy, will start vancomycin and cefepime -Orthopedic surgeon Dr. Sharol Given on board, plan for incision and drainage, hold Eliquis   AKI on CKD stage IV, with no anion gap metabolic acidosis -Nephrology on board, bicarb drip started -Protein/Cre >5000 (outpt 07/30), seems indicating nephrotic syndrome or rejection? -Check tacrolimus and mycophenolate level, hold both medication for now given sepsis and AKI  IIDM hyperglycemia -Secondary to sepsis, sliding scale, may need readjust  HTN -Hold clonidine BP meds, continue hydralazine  Chronic A. Fib -Rate controlled, hold Eliquis for incoming surgery  HLD -Statin  DVT prophylaxis: Heparin subcu Code Status: Full Code Family Communication: None at bedside Disposition Plan: Patient sick with complicated  medical problems of acute osteomyelitis abscess which need surgical intervention and also worsening of kidney function may need emergency dialysis, expect 3 to 5 days hospital stay. Consults called: Nephrology and orthopedic surgery Admission status: Telemetry admission   Lequita Halt MD [facility] Pager [phone]  12/31/2019, 1:50 PM

## 2019-12-31 NOTE — Progress Notes (Addendum)
Pharmacy Antibiotic Note  Paul [name] is a 64 y.o. male admitted on 12/30/2019 with early osteomyelitis. Of note, the patient has a history of CKD4, kidney transplant and bilateral BKA. Pharmacy has been consulted for vancomycin and cefepime dosing.  Plan: - Vancomycin 1000 mg q48h, no loading dose - Target a goal vancomycin trough of 15-20 mcg/mL - Cefepime 2 g q24h  - Monitor renal function, clinical status, cultures and length of therapy - Deescalate therapy as needed  Vancomycin dose was chosen without a loading dose due to the patient's CKD, current renal function, history of kidney transplant and bilateral BKA.  Height: 6' (182.9 cm) Weight: 80.7 kg (178 lb) IBW/kg (Calculated) : 77.6  Temp (24hrs), Avg:98.4 F (36.9 C), Min:98.1 F (36.7 C), Max:98.6 F (37 C)  Recent Labs  Lab 12/30/19 1819 12/30/19 1822 12/31/19 1115  WBC 13.3*  --   --   CREATININE 4.76*  --  5.25*  LATICACIDVEN  --  0.5  --     Estimated Creatinine Clearance: 15.2 mL/min (A) (by C-G formula based on SCr of 5.25 mg/dL (H)).    Allergies  Allergen Reactions  . Metformin And Related Other (See Comments)    Has kidney problems    Antimicrobials this admission: 8/13 vancomycin >> 8/13 cefepime >>  Microbiology results: None  Thank you for allowing pharmacy to be a part of this patient's care.  Shauna Hugh, PharmD, [place]  PGY-1 Pharmacy Resident 12/31/2019 1:54 PM  Please check [email or web address] for unit-specific pharmacy phone numbers.

## 2019-12-31 NOTE — ED Notes (Signed)
CBG 247 

## 2019-12-31 NOTE — ED Notes (Signed)
Update given to pts wife

## 2020-01-01 DIAGNOSIS — N184 Chronic kidney disease, stage 4 (severe): Secondary | ICD-10-CM

## 2020-01-01 DIAGNOSIS — Z794 Long term (current) use of insulin: Secondary | ICD-10-CM

## 2020-01-01 DIAGNOSIS — E1122 Type 2 diabetes mellitus with diabetic chronic kidney disease: Secondary | ICD-10-CM

## 2020-01-01 LAB — COMPREHENSIVE METABOLIC PANEL
ALT: 13 U/L (ref 0–44)
AST: 16 U/L (ref 15–41)
Albumin: 2 g/dL — ABNORMAL LOW (ref 3.5–5.0)
Alkaline Phosphatase: 50 U/L (ref 38–126)
Anion gap: 17 — ABNORMAL HIGH (ref 5–15)
BUN: 93 mg/dL — ABNORMAL HIGH (ref 8–23)
CO2: 10 mmol/L — ABNORMAL LOW (ref 22–32)
Calcium: 7.7 mg/dL — ABNORMAL LOW (ref 8.9–10.3)
Chloride: 116 mmol/L — ABNORMAL HIGH (ref 98–111)
Creatinine, Ser: 5.29 mg/dL — ABNORMAL HIGH (ref 0.61–1.24)
GFR calc Af Amer: 12 mL/min — ABNORMAL LOW (ref >60)
GFR calc non Af Amer: 10 mL/min — ABNORMAL LOW (ref >60)
Glucose, Bld: 186 mg/dL — ABNORMAL HIGH (ref 70–99)
Potassium: 2.6 mmol/L — CL (ref 3.5–5.1)
Sodium: 143 mmol/L (ref 135–145)
Total Bilirubin: 0.9 mg/dL (ref 0.3–1.2)
Total Protein: 4.6 g/dL — ABNORMAL LOW (ref 6.5–8.1)

## 2020-01-01 LAB — GLUCOSE, CAPILLARY
Glucose-Capillary: 213 mg/dL — ABNORMAL HIGH (ref 70–99)
Glucose-Capillary: 170 mg/dL — ABNORMAL HIGH (ref 70–99)
Glucose-Capillary: 214 mg/dL — ABNORMAL HIGH (ref 70–99)
Glucose-Capillary: 230 mg/dL — ABNORMAL HIGH (ref 70–99)

## 2020-01-01 LAB — CK: Total CK: 56 U/L (ref 49–397)

## 2020-01-01 MED ORDER — MYCOPHENOLATE SODIUM 180 MG PO TBEC
360.0000 mg | DELAYED_RELEASE_TABLET | Freq: Two times a day (BID) | ORAL | Status: DC
  Administered 2020-01-01 – 2020-01-03 (×6): 360 mg via ORAL
  Filled 2020-01-01 (×7): qty 2

## 2020-01-01 MED ORDER — POTASSIUM CHLORIDE 10 MEQ/100ML IV SOLN
10.0000 meq | INTRAVENOUS | Status: AC
  Administered 2020-01-01 (×4): 10 meq via INTRAVENOUS
  Filled 2020-01-01 (×3): qty 100

## 2020-01-01 MED ORDER — PANCRELIPASE (LIP-PROT-AMYL) 12000-38000 UNITS PO CPEP
12000.0000 [IU] | ORAL_CAPSULE | Freq: Three times a day (TID) | ORAL | Status: DC
  Administered 2020-01-01 – 2020-01-18 (×39): 12000 [IU] via ORAL
  Filled 2020-01-01 (×41): qty 1

## 2020-01-01 MED ORDER — TACROLIMUS 1 MG PO CAPS
2.0000 mg | ORAL_CAPSULE | Freq: Every day | ORAL | Status: DC
  Administered 2020-01-01 – 2020-01-03 (×3): 2 mg via ORAL
  Filled 2020-01-01 (×3): qty 2

## 2020-01-01 MED ORDER — TACROLIMUS 1 MG PO CAPS: 1.0000 mg | ORAL_CAPSULE | Freq: Two times a day (BID) | ORAL | Status: DC

## 2020-01-01 MED ORDER — TACROLIMUS 1 MG PO CAPS
3.0000 mg | ORAL_CAPSULE | Freq: Every day | ORAL | Status: DC
  Administered 2020-01-01 – 2020-01-03 (×3): 3 mg via ORAL
  Filled 2020-01-01 (×4): qty 3

## 2020-01-01 MED ORDER — ONDANSETRON HCL 4 MG/2ML IJ SOLN
4.0000 mg | Freq: Four times a day (QID) | INTRAMUSCULAR | Status: DC | PRN
  Administered 2020-01-01 – 2020-01-13 (×9): 4 mg via INTRAVENOUS
  Filled 2020-01-01 (×9): qty 2

## 2020-01-01 NOTE — Progress Notes (Signed)
New Admission Note:  Arrival Method: Stretcher Mental Orientation: Alert and oriented x 4 Telemetry: Box 15 Assessment: Completed Skin: Warm and dry B BKA IV: NSL Pain: Denies Tubes: N/A Safety Measures: Safety Fall Prevention Plan initiated.  Admission: Completed 5 M  Orientation: Patient has been orientated to the room, unit and the staff. Welcome booklet given.  Family: None Orders have been reviewed and implemented. Will continue to monitor the patient. Call light has been placed within reach and bed alarm has been activated.   Sima Matas BSN, RN  Phone Number: [phone]

## 2020-01-01 NOTE — Progress Notes (Signed)
PROGRESS NOTE  Paul [name] OEV:[id number] DOB: [birth year] DOA: 12/30/2019 PCP: [name], MD   LOS: 1 day   Brief narrative: As per HPI,  Paul [name] is a 64 y.o. male with medical history significant of kidney transplant recipient (2016) with CKD stage IV, hypertension, IDDM, chronic A. fib on Xarelto, B/L BKA secondary to PVD, presented with new onset of right BKA stump pain and rash about 1 week prior to presentation attributed that to a unfit prosthetic which might caused a skin abrasions.  He started to notice significant pain rash associated the end of right leg stump about 3 days ago.  Pain has been constant and gradually getting worse, denies any fever chills.  Patient has been following with [facility] for the kidney transplant, he was seen in the office about 2 weeks ago and multiple blood work and urine analysis sent, urine protein/Cre>5,000, patient however was not informed about results yet.  Patient claims his kidney function has been stable. In the ED, Creatinine was 5.2 compared to 3.5 at baseline, bicarb less than 7 compared to 12 last week, WBC 13.3, glucose 254.   CT scan of the right lower extremity showed suspicion for early acute osteomyelitis involve the distal tibia, distal fluid collection hematoma versus abscess. Patient was then admitted to the hospital for further evaluation and treatment  Assessment/Plan:  Active Problems:   AKI (acute kidney injury) ([place])   Cellulitis  Sepsis secondary to right leg osteomyelitis and abscess on BKA stump site -Present on admission. As evidenced by leukocytosis, worsening renal function, imaging evidence of early osteomyelitis and abscess as source of infection. On vancomycin and cefepime. Pharmacy to dose. orthopedic surgery Dr Sharol Given on board. Plan for incision and drainage. Continue to hold Eliquis at this time.   AKI on CKD stage IV. History of renal transplant. Nephrology on board. Received bicarb drip for  severe anion gap metabolic acidosis. History of renal transplant. Continue tacrolimus and mycophenolate on hold. Check levels. Continue to monitor creatinine levels. Follow nephrology recommendations.  Diabetes mellitus type II with hyperglycemia Secondary to sepsis. Continue sliding scale insulin Accu-Cheks diabetic diet. Will monitor blood glucose levels. Hemoglobin A1c of 10.3  History of pancreatic insufficiency. History of duodenal adenocarcinoma status post resection. Add Creon  Essential hypertension -Hold clonidine, continue hydralazine    Chronic A. Fib -Rate controlled, hold Eliquis for now.  Hyperlipidemia Continue statin   DVT prophylaxis: heparin injection 5,000 Units Start: 12/31/19 1330  Code Status: Full code  Family Communication: None  Status is: Inpatient  Remains inpatient appropriate because:Unsafe d/c plan, IV treatments appropriate due to intensity of illness or inability to take PO and Inpatient level of care appropriate due to severity of illness   Dispo: The patient is from: Home              Anticipated d/c is to: Home              Anticipated d/c date is: 3 days              Patient currently is not medically stable to d/c.  Consultants:  Orthopedics  Nephrology  Procedures:  None yet  Antibiotics:  . Vancomycin and cefepime  Anti-infectives (From admission, onward)   Start     Dose/Rate Route Frequency Ordered Stop   12/31/19 1400  vancomycin (VANCOCIN) IVPB 1000 mg/200 mL premix     Discontinue     1,000 mg 200 mL/hr over 60 Minutes Intravenous Every  48 hours 12/31/19 1347     12/31/19 1345  ceFEPIme (MAXIPIME) 2 g in sodium chloride 0.9 % 100 mL IVPB     Discontinue     2 g 200 mL/hr over 30 Minutes Intravenous Every 24 hours 12/31/19 1347     12/31/19 1330  vancomycin (VANCOCIN) IVPB 1000 mg/200 mL premix  Status:  Discontinued        1,000 mg 200 mL/hr over 60 Minutes Intravenous  Once 12/31/19 1315 12/31/19 1347       Subjective: Today, patient was seen and examined at bedside. Patient complains of pain at the stump site.  Objective: Vitals:   01/01/20 0453 01/01/20 0845  BP: (!) 135/58 115/89  Pulse: 62 83  Resp: 18 18  Temp: 98.1 F (36.7 C) 97.7 F (36.5 C)  SpO2: 97% 92%    Intake/Output Summary (Last 24 hours) at 01/01/2020 1113 Last data filed at 01/01/2020 0900 Gross per 24 hour  Intake 2899.27 ml  Output 350 ml  Net 2549.27 ml   Filed Weights   12/31/19 1119  Weight: 80.7 kg   Body mass index is 24.14 kg/m.   Physical Exam: GENERAL: Patient is alert awake and oriented. Not in obvious distress. Thinly built HENT: No scleral pallor or icterus. Pupils equally reactive to light. Oral mucosa is dry NECK: is supple, no gross swelling noted. CHEST: Clear to auscultation. No crackles or wheezes.  Diminished breath sounds bilaterally. CVS: S1 and S2 heard, no murmur. Regular rate and rhythm.  ABDOMEN: Soft, non-tender, bowel sounds are present. EXTREMITIES: Right distal BKA stump with erythematous rash with blister. Tenderness on palpation. Left below-knee amputation - stump is healthy. CNS: Cranial nerves are intact. No focal motor deficits. SKIN: warm and dry, right distal BKA stump with erythema and blister.  Data Review: I have personally reviewed the following laboratory data and studies,  CBC: Recent Labs  Lab 12/30/19 1819  WBC 13.3*  NEUTROABS 10.1*  HGB 10.4*  HCT 34.2*  MCV 92.4  PLT 979   Basic Metabolic Panel: Recent Labs  Lab 12/30/19 1819 12/31/19 1115  NA 141 142  K 3.9 3.7  CL 121* 119*  CO2 7* <7*  GLUCOSE 269* 254*  BUN 80* 92*  CREATININE 4.76* 5.25*  CALCIUM 8.1* 8.4*   Liver Function Tests: Recent Labs  Lab 12/30/19 1819  AST 10*  ALT 11  ALKPHOS 59  BILITOT 0.7  PROT 5.7*  ALBUMIN 2.6*   No results for input(s): LIPASE, AMYLASE in the last 168 hours. No results for input(s): AMMONIA in the last 168 hours. Cardiac Enzymes: No  results for input(s): CKTOTAL, CKMB, CKMBINDEX, TROPONINI in the last 168 hours. BNP (last 3 results) Recent Labs    10/12/19 0011  BNP 1,980.9*    ProBNP (last 3 results) No results for input(s): PROBNP in the last 8760 hours.  CBG: Recent Labs  Lab 12/31/19 1739 12/31/19 2142 01/01/20 0613  GLUCAP 247* 168* 213*   Recent Results (from the past 240 hour(s))  SARS Coronavirus 2 by RT PCR (hospital order, performed in [facility] hospital lab) Nasopharyngeal Nasopharyngeal Swab     Status: None   Collection Time: 12/31/19 11:15 AM   Specimen: Nasopharyngeal Swab  Result Value Ref Range Status   SARS Coronavirus 2 NEGATIVE NEGATIVE Final    Comment: (NOTE) SARS-CoV-2 target nucleic acids are NOT DETECTED.  The SARS-CoV-2 RNA is generally detectable in upper and lower respiratory specimens during the acute phase of infection. The lowest  concentration of SARS-CoV-2 viral copies this assay can detect is 250 copies / mL. A negative result does not preclude SARS-CoV-2 infection and should not be used as the sole basis for treatment or other patient management decisions.  A negative result may occur with improper specimen collection / handling, submission of specimen other than nasopharyngeal swab, presence of viral mutation(s) within the areas targeted by this assay, and inadequate number of viral copies (<250 copies / mL). A negative result must be combined with clinical observations, patient history, and epidemiological information.  Fact Sheet for Patients:   StrictlyIdeas.no  Fact Sheet for Healthcare Providers: BankingDealers.co.za  This test is not yet approved or  cleared by the [place] FDA and has been authorized for detection and/or diagnosis of SARS-CoV-2 by FDA under an Emergency Use Authorization (EUA).  This EUA will remain in effect (meaning this test can be used) for the duration of the COVID-19 declaration  under Section 564(b)(1) of the Act, 21 U.S.C. section 360bbb-3(b)(1), unless the authorization is terminated or revoked sooner.  Performed at [facility], [place] [address], [place], [place] [address]      Studies: US Renal Transplant w/Doppler  Result Date: 12/31/2019 CLINICAL DATA:  Acute renal failure. History of a renal transplantation. EXAM: ULTRASOUND OF RENAL TRANSPLANT WITH RENAL DOPPLER ULTRASOUND TECHNIQUE: Ultrasound examination of the renal transplant was performed with gray-scale, color and duplex doppler evaluation. COMPARISON:  CT, 04/23/2019. FINDINGS: Transplant kidney location: RLQ Transplant Kidney: Renal measurements: 11.1 x 4.3 x 5.2 cm = volume: 130.60mL. Normal in size and parenchymal echogenicity. No evidence of mass or hydronephrosis. Trace perinephric fluid adjacent to the lower pole. Color flow in the main renal artery:  Yes Color flow in the main renal vein:  Yes Duplex Doppler Evaluation: Main Renal Artery Resistive Index: 0.87. Velocity, peak systolic, 240.9 centimeters/second. Venous waveform in main renal vein:  Present, 19 centimeters/second Intrarenal resistive index in upper pole:  0.74 (normal 0.6-0.8; equivocal 0.8-0.9; abnormal >= 0.9) Intrarenal resistive index in lower pole: 0.97 (normal 0.6-0.8; equivocal 0.8-0.9; abnormal >= 0.9) Bladder: Some debris.  No wall thickening, stone or mass. Other findings:  None. IMPRESSION: 1. Abnormally elevated resistive index in the lower pole of the transplant kidney with loss of the diastolic waveform. Resistive index measures 0.97. 2. Trace perinephric fluid adjacent to the lower pole. 3. No other abnormality. Electronically Signed   By: Lajean Manes M.D.   On: 12/31/2019 13:59   CT EXTREMITY LOWER RIGHT WO CONTRAST  Result Date: 12/31/2019 CLINICAL DATA:  Pain and swelling at right BKA stump EXAM: CT OF THE LOWER RIGHT EXTREMITY WITHOUT CONTRAST TECHNIQUE: Multidetector CT imaging of the right lower extremity was  performed according to the standard protocol. COMPARISON:  None available FINDINGS: Bones/Joint/Cartilage Patient is status post below-knee amputation of the right lower extremity. Subtle loss of the cortical definition of the distal amputation margin of the tibia (series 9, image 47; series 7, image 82). The residual fibula appears intact with preservation of the distal cortex. The patella and visualized distal femur appear intact. Bones are demineralized. No acute fracture. No dislocation. Joint spaces are relatively preserved. Small nonspecific knee joint effusion. Ligaments Suboptimally assessed by CT. Collateral ligaments appear grossly intact. Muscles and Tendons No acute musculotendinous abnormality. Soft tissues Swelling and edema at the distal stump with possible cutaneous blister (series 8, image 74). There is a more focal slightly hyperdense collection at the posterolateral aspect of the distal stump measuring approximately 2.6 x 1.7  x 2.6 cm (series 4, image 54), which could represent a hematoma. No soft tissue gas. Extensive vascular calcifications. IMPRESSION: 1. Status post below-knee amputation of the right lower extremity. Subtle loss of the cortical definition of the distal amputation margin of the tibia, raises the suspicion for early acute osteomyelitis. 2. Swelling and edema at the distal stump with possible cutaneous blister. There is a more focal hyperdense collection at the posterolateral aspect of the distal stump measuring approximately 2.6 x 1.7 x 2.6 cm, which could represent a hematoma. 3. Small nonspecific knee joint effusion. Electronically Signed   By: Davina Poke D.O.   On: 12/31/2019 13:23     Flora Lipps, MD  [facility] 01/01/2020

## 2020-01-01 NOTE — Progress Notes (Signed)
Small crack wound deep inside midway on the fissure between buttocks.Skin assessed with Ed Blalock.N.

## 2020-01-01 NOTE — Progress Notes (Addendum)
[name] Progress Note   64 y.o. male  DDRT 64 CKD4, AoCKD Afib DM HTN b/l BKA h/o duodenal adenocarcinoma s/p resection. 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 Currently on Myfortic , Tac and prednisone. Baseline Cr is 2.8-3.4 (as recently as 10/12/2019) but appears to be slowly worsening as an outpatient.  Patient is here with leg pain and states that the pain to the right leg stump has worsened over the past few days but denies any fever.   He stopped taking his medications about a week ago and of note he is on HCO3 3 tabs 4x a day. He denies diarrhea but has had markedly decreased PO intake, no sick contacts, no myalgias, dizziness, syncopal episodes or visual disturbances. He has also not taken his transplant medications in about a week and has a history of poor adherence for which he is on a "observation/probation period" prior to relisting for kidney transplant.  Assessment/ Plan:   1. Acute on CKD4 BL cr 2.8-3.4 s/p DDRT with DGF requiring HD x3 as well as subsequent ACR and antibody mediated rejection requiring IVIG as recently as 2018. Renal function  appears to be progressively worsening over the past year as well. He appears very dry and I am suspicious that he has not been taking the Creon as well and diarrhea is likely underreported + not taking high dose HCO3 for a week -> severe gap metabolic acidosis as well as AKI on top of already severely compromised renal function. - Will challenge with IV HCO3 and restart oral HCO3 as well. - Isotonic fluids to fluids resuscitate; his BP already usually runs low -> I gave HCO3 (currently off). May need to restart pending STAT Bmet this am.  - Renal transplant ultrasound shows no  Obstruction; advanced disease already. - Hold the Demadex for now.  - Restart Prograf 3/2 and Myfortic 2 tabs BID; no need to check levels as he hasn't been taking.  2. DM - poorly controlled. Per primary. 3. HTN - BP  actually on low side at this time. 4. Right leg cellulitis r/o abscess/ pustule? 5. Pancreatic insufficiency - should be on Creon 12K units with each meal or snack.  Subjective:   Feeling better except for the right stump. Denies f/c/n/v/dyspnea/ diarrhea   Objective:   BP 115/89 (BP Location: Left Arm)   Pulse 83   Temp 97.7 F (36.5 C) (Oral)   Resp 18   Ht 6' (1.829 m)   Wt 80.7 kg   SpO2 92%   BMI 24.14 kg/m   Intake/Output Summary (Last 24 hours) at 01/01/2020 1020 Last data filed at 01/01/2020 0600 Gross per 24 hour  Intake 2599.27 ml  Output 150 ml  Net 2449.27 ml   Weight change:   Physical Exam: GEN: NAD, A&Ox3, NCAT HEENT: No conjunctival pallor, EOMI NECK: Supple, no thyromegaly LUNGS: CTA B/L no rales, rhonchi or wheezing CV: RRR, No M/R/G ABD: SNDNT +BS  EXT: B/L BKA; erythema and ? Pustule on rt stump ACCESS: Rt Cimino with good bruit   Imaging: US Renal Transplant w/Doppler  Result Date: 12/31/2019 CLINICAL DATA:  Acute renal failure. History of a renal transplantation. EXAM: ULTRASOUND OF RENAL TRANSPLANT WITH RENAL DOPPLER ULTRASOUND TECHNIQUE: Ultrasound examination of the renal transplant was performed with gray-scale, color and duplex doppler evaluation. COMPARISON:  CT, 04/23/2019. FINDINGS: Transplant kidney location: RLQ Transplant Kidney: Renal measurements: 11.1 x 4.3 x 5.2 cm = volume: 130.48mL. Normal in size and parenchymal echogenicity. No  evidence of mass or hydronephrosis. Trace perinephric fluid adjacent to the lower pole. Color flow in the main renal artery:  Yes Color flow in the main renal vein:  Yes Duplex Doppler Evaluation: Main Renal Artery Resistive Index: 0.87. Velocity, peak systolic, 366.2 centimeters/second. Venous waveform in main renal vein:  Present, 19 centimeters/second Intrarenal resistive index in upper pole:  0.74 (normal 0.6-0.8; equivocal 0.8-0.9; abnormal >= 0.9) Intrarenal resistive index in lower pole: 0.97 (normal  0.6-0.8; equivocal 0.8-0.9; abnormal >= 0.9) Bladder: Some debris.  No wall thickening, stone or mass. Other findings:  None. IMPRESSION: 1. Abnormally elevated resistive index in the lower pole of the transplant kidney with loss of the diastolic waveform. Resistive index measures 0.97. 2. Trace perinephric fluid adjacent to the lower pole. 3. No other abnormality. Electronically Signed   By: Lajean Manes M.D.   On: 12/31/2019 13:59   CT EXTREMITY LOWER RIGHT WO CONTRAST  Result Date: 12/31/2019 CLINICAL DATA:  Pain and swelling at right BKA stump EXAM: CT OF THE LOWER RIGHT EXTREMITY WITHOUT CONTRAST TECHNIQUE: Multidetector CT imaging of the right lower extremity was performed according to the standard protocol. COMPARISON:  None available FINDINGS: Bones/Joint/Cartilage Patient is status post below-knee amputation of the right lower extremity. Subtle loss of the cortical definition of the distal amputation margin of the tibia (series 9, image 47; series 7, image 82). The residual fibula appears intact with preservation of the distal cortex. The patella and visualized distal femur appear intact. Bones are demineralized. No acute fracture. No dislocation. Joint spaces are relatively preserved. Small nonspecific knee joint effusion. Ligaments Suboptimally assessed by CT. Collateral ligaments appear grossly intact. Muscles and Tendons No acute musculotendinous abnormality. Soft tissues Swelling and edema at the distal stump with possible cutaneous blister (series 8, image 74). There is a more focal slightly hyperdense collection at the posterolateral aspect of the distal stump measuring approximately 2.6 x 1.7 x 2.6 cm (series 4, image 54), which could represent a hematoma. No soft tissue gas. Extensive vascular calcifications. IMPRESSION: 1. Status post below-knee amputation of the right lower extremity. Subtle loss of the cortical definition of the distal amputation margin of the tibia, raises the suspicion  for early acute osteomyelitis. 2. Swelling and edema at the distal stump with possible cutaneous blister. There is a more focal hyperdense collection at the posterolateral aspect of the distal stump measuring approximately 2.6 x 1.7 x 2.6 cm, which could represent a hematoma. 3. Small nonspecific knee joint effusion. Electronically Signed   By: Davina Poke D.O.   On: 12/31/2019 13:23    Labs: BMET Recent Labs  Lab 12/30/19 1819 12/31/19 1115  NA 141 142  K 3.9 3.7  CL 121* 119*  CO2 7* <7*  GLUCOSE 269* 254*  BUN 80* 92*  CREATININE 4.76* 5.25*  CALCIUM 8.1* 8.4*   CBC Recent Labs  Lab 12/30/19 1819  WBC 13.3*  NEUTROABS 10.1*  HGB 10.4*  HCT 34.2*  MCV 92.4  PLT 161    Medications:    . aspirin EC  81 mg Oral Daily  . atorvastatin  10 mg Oral Q M,W,F  . calcitRIOL  0.25 mcg Oral Daily  . calcium acetate  1,334 mg Oral TID WC  . cholecalciferol  4,000 Units Oral Daily  . heparin  5,000 Units Subcutaneous Q12H  . hydrALAZINE  50 mg Oral Q6H  . insulin aspart  0-9 Units Subcutaneous TID WC  . magnesium chloride  2 tablet Oral BID  . pantoprazole  40 mg Oral Daily  . predniSONE  5 mg Oral Q breakfast  . sertraline  50 mg Oral Daily  . sodium bicarbonate  1,950 mg Oral QID  . tamsulosin  0.4 mg Oral QPC supper      Otelia Santee, MD 01/01/2020, 10:20 AM

## 2020-01-01 NOTE — Plan of Care (Signed)
  Problem: Clinical Measurements: Goal: Cardiovascular complication will be avoided Outcome: Progressing   

## 2020-01-02 DIAGNOSIS — Z940 Kidney transplant status: Secondary | ICD-10-CM

## 2020-01-02 LAB — COMPREHENSIVE METABOLIC PANEL
ALT: 14 U/L (ref 0–44)
AST: 14 U/L — ABNORMAL LOW (ref 15–41)
Albumin: 2.1 g/dL — ABNORMAL LOW (ref 3.5–5.0)
Alkaline Phosphatase: 52 U/L (ref 38–126)
Anion gap: 19 — ABNORMAL HIGH (ref 5–15)
BUN: 97 mg/dL — ABNORMAL HIGH (ref 8–23)
CO2: 8 mmol/L — ABNORMAL LOW (ref 22–32)
Calcium: 7.9 mg/dL — ABNORMAL LOW (ref 8.9–10.3)
Chloride: 116 mmol/L — ABNORMAL HIGH (ref 98–111)
Creatinine, Ser: 5.5 mg/dL — ABNORMAL HIGH (ref 0.61–1.24)
GFR calc Af Amer: 12 mL/min — ABNORMAL LOW (ref >60)
GFR calc non Af Amer: 10 mL/min — ABNORMAL LOW (ref >60)
Glucose, Bld: 260 mg/dL — ABNORMAL HIGH (ref 70–99)
Potassium: 3.6 mmol/L (ref 3.5–5.1)
Sodium: 143 mmol/L (ref 135–145)
Total Bilirubin: 1 mg/dL (ref 0.3–1.2)
Total Protein: 4.8 g/dL — ABNORMAL LOW (ref 6.5–8.1)

## 2020-01-02 LAB — CBC
HCT: 25.5 % — ABNORMAL LOW (ref 39.0–52.0)
Hemoglobin: 8 g/dL — ABNORMAL LOW (ref 13.0–17.0)
MCH: 28.1 pg (ref 26.0–34.0)
MCHC: 31.4 g/dL (ref 30.0–36.0)
MCV: 89.5 fL (ref 80.0–100.0)
Platelets: 142 10*3/uL — ABNORMAL LOW (ref 150–400)
RBC: 2.85 MIL/uL — ABNORMAL LOW (ref 4.22–5.81)
RDW: 15.5 % (ref 11.5–15.5)
WBC: 11.4 10*3/uL — ABNORMAL HIGH (ref 4.0–10.5)
nRBC: 0 % (ref 0.0–0.2)

## 2020-01-02 LAB — GLUCOSE, CAPILLARY
Glucose-Capillary: 256 mg/dL — ABNORMAL HIGH (ref 70–99)
Glucose-Capillary: 173 mg/dL — ABNORMAL HIGH (ref 70–99)
Glucose-Capillary: 168 mg/dL — ABNORMAL HIGH (ref 70–99)

## 2020-01-02 LAB — PHOSPHORUS: Phosphorus: 9 mg/dL — ABNORMAL HIGH (ref 2.5–4.6)

## 2020-01-02 LAB — MAGNESIUM: Magnesium: 1.6 mg/dL — ABNORMAL LOW (ref 1.7–2.4)

## 2020-01-02 MED ORDER — CHLORHEXIDINE GLUCONATE CLOTH 2 % EX PADS
6.0000 | MEDICATED_PAD | Freq: Every day | CUTANEOUS | Status: DC
  Administered 2020-01-02 – 2020-01-15 (×10): 6 via TOPICAL

## 2020-01-02 MED ORDER — STERILE WATER FOR INJECTION IV SOLN
INTRAVENOUS | Status: AC
  Filled 2020-01-02 (×3): qty 850

## 2020-01-02 NOTE — Progress Notes (Signed)
PROGRESS NOTE  Paul [name] XTA:[id number] DOB: 20-Dec-1955 DOA: 12/30/2019 PCP: [name], MD   LOS: 2 days   Brief narrative: As per HPI,  Paul [name] is a 64 y.o. male with medical history significant of kidney transplant recipient (2016) with CKD stage IV, hypertension, IDDM, chronic A. fib on Xarelto, B/L BKA secondary to PVD, presented with new onset of right BKA stump pain and rash about 1 week prior to presentation attributed that to a unfit prosthetic which might caused a skin abrasions.  He started to notice significant pain rash associated the end of right leg stump about 3 days ago.  Pain has been constant and gradually getting worse, denies any fever chills.  Patient has been following with [facility] for the kidney transplant, he was seen in the office about 2 weeks ago and multiple blood work and urine analysis sent, urine protein/Cre>5,000, patient however was not informed about results yet.  Patient claims his kidney function has been stable. In the ED, Creatinine was 5.2 compared to 3.5 at baseline, bicarb less than 7 compared to 12 last week, WBC 13.3, glucose 254.   CT scan of the right lower extremity showed suspicion for early acute osteomyelitis involve the distal tibia, distal fluid collection hematoma versus abscess. Patient was then admitted to the hospital for further evaluation and treatment  Assessment/Plan:  Active Problems:   AKI (acute kidney injury) ([place])   Cellulitis  Sepsis secondary to right leg osteomyelitis and abscess on BKA stump site -Present on admission. Sepsis as evidenced by leukocytosis, worsening renal function, imaging evidence of early osteomyelitis and abscess as source of infection. On vancomycin and cefepime. Pharmacy to dose. Orthopedic surgery Dr Sharol Given was notified by ER and I have notified via epic chat today. . Patient will likely need incision and drainage. Continue to hold Eliquis at this time.   AKI on CKD stage IV.  History of renal transplant.  Severe metabolic acidosis. Nephrology on board. Received bicarb drip for severe anion gap metabolic acidosis. History of renal transplant. Continue tacrolimus, prednisone and mycophenolate. Continue to monitor creatinine levels. Follow nephrology recommendations.  Has been started on bicarb drip again.  Renal function has not been improving and is possibility that patient might need need hemodialysis if transplant is not working.  Diabetes mellitus type II with hyperglycemia Secondary to sepsis. Continue sliding scale insulin, Accu-Cheks diabetic diet. Will monitor blood glucose levels. Hemoglobin A1c of 10.3.  Latest POC glucose of 256.  On low-dose prednisone as well.  Mild hypomagnesemia today.  Magnesium 1.6.  Continue replacement.  History of pancreatic insufficiency. History of duodenal adenocarcinoma status post resection. Added Creon on 01/01/2020  Essential hypertension -Hold clonidine, continue hydralazine with parameters.  Blood pressure borderline low.  Chronic A. Fib -Rate controlled, hold Eliquis for now.  Hyperlipidemia Continue statin   DVT prophylaxis: heparin injection 5,000 Units Start: 12/31/19 1330  Code Status: Full code  Family Communication: None  Status is: Inpatient  Remains inpatient appropriate because:Unsafe d/c plan, IV treatments appropriate due to intensity of illness or inability to take PO and Inpatient level of care appropriate due to severity of illness   Dispo: The patient is from: Home              Anticipated d/c is to: Home              Anticipated d/c date is: 3 days  Patient currently is not medically stable to d/c.  Consultants:  Orthopedics  Nephrology  Procedures:  None yet  Antibiotics:  . Vancomycin and cefepime IV 8/13>  Anti-infectives (From admission, onward)   Start     Dose/Rate Route Frequency Ordered Stop   12/31/19 1400  vancomycin (VANCOCIN) IVPB 1000 mg/200 mL  premix     Discontinue     1,000 mg 200 mL/hr over 60 Minutes Intravenous Every 48 hours 12/31/19 1347     12/31/19 1345  ceFEPIme (MAXIPIME) 2 g in sodium chloride 0.9 % 100 mL IVPB     Discontinue     2 g 200 mL/hr over 30 Minutes Intravenous Every 24 hours 12/31/19 1347     12/31/19 1330  vancomycin (VANCOCIN) IVPB 1000 mg/200 mL premix  Status:  Discontinued        1,000 mg 200 mL/hr over 60 Minutes Intravenous  Once 12/31/19 1315 12/31/19 1347      Subjective: Today, patient was seen and examined at bedside.  Denies overt complaint but mild pain at the amputation site.  Denies any fevers, chills or rigor.  Objective: Vitals:   01/02/20 0619 01/02/20 0900  BP: (!) 97/45 (!) 97/45  Pulse: 71 67  Resp: 16   Temp: 97.7 F (36.5 C)   SpO2: 98%     Intake/Output Summary (Last 24 hours) at 01/02/2020 1037 Last data filed at 01/02/2020 0907 Gross per 24 hour  Intake 430 ml  Output 1025 ml  Net -595 ml   Filed Weights   12/31/19 1119  Weight: 80.7 kg   Body mass index is 24.14 kg/m.   Physical Exam: GENERAL: Patient is alert awake and oriented. Not in obvious distress. Thinly built HENT: No scleral pallor or icterus. Pupils equally reactive to light. Oral mucosa is dry NECK: is supple, no gross swelling noted. CHEST: Clear to auscultation. No crackles or wheezes.  Diminished breath sounds bilaterally. CVS: S1 and S2 heard, no murmur. Regular rate and rhythm.  ABDOMEN: Soft, non-tender, bowel sounds are present. EXTREMITIES: Right distal BKA stump with erythematous rash with blister. Tenderness on palpation. Left below-knee amputation - stump is healthy. CNS: Cranial nerves are intact. No focal motor deficits. SKIN: warm and dry, right distal BKA stump with erythema and blister.  Data Review: I have personally reviewed the following laboratory data and studies,  CBC: Recent Labs  Lab 12/30/19 1819 01/02/20 0353  WBC 13.3* 11.4*  NEUTROABS 10.1*  --   HGB 10.4*  8.0*  HCT 34.2* 25.5*  MCV 92.4 89.5  PLT 161 035*   Basic Metabolic Panel: Recent Labs  Lab 12/30/19 1819 12/31/19 1115 01/01/20 1100 01/02/20 0353  NA 141 142 143 143  K 3.9 3.7 2.6* 3.6  CL 121* 119* 116* 116*  CO2 7* <7* 10* 8*  GLUCOSE 269* 254* 186* 260*  BUN 80* 92* 93* 97*  CREATININE 4.76* 5.25* 5.29* 5.50*  CALCIUM 8.1* 8.4* 7.7* 7.9*  MG  --   --   --  1.6*  PHOS  --   --   --  9.0*   Liver Function Tests: Recent Labs  Lab 12/30/19 1819 01/01/20 1100 01/02/20 0353  AST 10* 16 14*  ALT 11 13 14   ALKPHOS 59 50 52  BILITOT 0.7 0.9 1.0  PROT 5.7* 4.6* 4.8*  ALBUMIN 2.6* 2.0* 2.1*   No results for input(s): LIPASE, AMYLASE in the last 168 hours. No results for input(s): AMMONIA in the last 168 hours. Cardiac Enzymes:  Recent Labs  Lab 01/01/20 1100  CKTOTAL 56   BNP (last 3 results) Recent Labs    10/12/19 0011  BNP 1,980.9*    ProBNP (last 3 results) No results for input(s): PROBNP in the last 8760 hours.  CBG: Recent Labs  Lab 01/01/20 0613 01/01/20 1138 01/01/20 1641 01/01/20 2212 01/02/20 0659  GLUCAP 213* 170* 214* 230* 256*   Recent Results (from the past 240 hour(s))  SARS Coronavirus 2 by RT PCR (hospital order, performed in [facility] hospital lab) Nasopharyngeal Nasopharyngeal Swab     Status: None   Collection Time: 12/31/19 11:15 AM   Specimen: Nasopharyngeal Swab  Result Value Ref Range Status   SARS Coronavirus 2 NEGATIVE NEGATIVE Final    Comment: (NOTE) SARS-CoV-2 target nucleic acids are NOT DETECTED.  The SARS-CoV-2 RNA is generally detectable in upper and lower respiratory specimens during the acute phase of infection. The lowest concentration of SARS-CoV-2 viral copies this assay can detect is 250 copies / mL. A negative result does not preclude SARS-CoV-2 infection and should not be used as the sole basis for treatment or other patient management decisions.  A negative result may occur with improper specimen  collection / handling, submission of specimen other than nasopharyngeal swab, presence of viral mutation(s) within the areas targeted by this assay, and inadequate number of viral copies (<250 copies / mL). A negative result must be combined with clinical observations, patient history, and epidemiological information.  Fact Sheet for Patients:   StrictlyIdeas.no  Fact Sheet for Healthcare Providers: BankingDealers.co.za  This test is not yet approved or  cleared by the [place] FDA and has been authorized for detection and/or diagnosis of SARS-CoV-2 by FDA under an Emergency Use Authorization (EUA).  This EUA will remain in effect (meaning this test can be used) for the duration of the COVID-19 declaration under Section 564(b)(1) of the Act, 21 U.S.C. section 360bbb-3(b)(1), unless the authorization is terminated or revoked sooner.  Performed at [facility], [place] [address] [place], [place] [address]      Studies: US Renal Transplant w/Doppler  Result Date: 12/31/2019 CLINICAL DATA:  Acute renal failure. History of a renal transplantation. EXAM: ULTRASOUND OF RENAL TRANSPLANT WITH RENAL DOPPLER ULTRASOUND TECHNIQUE: Ultrasound examination of the renal transplant was performed with gray-scale, color and duplex doppler evaluation. COMPARISON:  CT, 04/23/2019. FINDINGS: Transplant kidney location: RLQ Transplant Kidney: Renal measurements: 11.1 x 4.3 x 5.2 cm = volume: 130.56mL. Normal in size and parenchymal echogenicity. No evidence of mass or hydronephrosis. Trace perinephric fluid adjacent to the lower pole. Color flow in the main renal artery:  Yes Color flow in the main renal vein:  Yes Duplex Doppler Evaluation: Main Renal Artery Resistive Index: 0.87. Velocity, peak systolic, 481.8 centimeters/second. Venous waveform in main renal vein:  Present, 19 centimeters/second Intrarenal resistive index in upper pole:  0.74 (normal  0.6-0.8; equivocal 0.8-0.9; abnormal >= 0.9) Intrarenal resistive index in lower pole: 0.97 (normal 0.6-0.8; equivocal 0.8-0.9; abnormal >= 0.9) Bladder: Some debris.  No wall thickening, stone or mass. Other findings:  None. IMPRESSION: 1. Abnormally elevated resistive index in the lower pole of the transplant kidney with loss of the diastolic waveform. Resistive index measures 0.97. 2. Trace perinephric fluid adjacent to the lower pole. 3. No other abnormality. Electronically Signed   By: Lajean Manes M.D.   On: 12/31/2019 13:59   CT EXTREMITY LOWER RIGHT WO CONTRAST  Result Date: 12/31/2019 CLINICAL DATA:  Pain and swelling at right BKA  stump EXAM: CT OF THE LOWER RIGHT EXTREMITY WITHOUT CONTRAST TECHNIQUE: Multidetector CT imaging of the right lower extremity was performed according to the standard protocol. COMPARISON:  None available FINDINGS: Bones/Joint/Cartilage Patient is status post below-knee amputation of the right lower extremity. Subtle loss of the cortical definition of the distal amputation margin of the tibia (series 9, image 47; series 7, image 82). The residual fibula appears intact with preservation of the distal cortex. The patella and visualized distal femur appear intact. Bones are demineralized. No acute fracture. No dislocation. Joint spaces are relatively preserved. Small nonspecific knee joint effusion. Ligaments Suboptimally assessed by CT. Collateral ligaments appear grossly intact. Muscles and Tendons No acute musculotendinous abnormality. Soft tissues Swelling and edema at the distal stump with possible cutaneous blister (series 8, image 74). There is a more focal slightly hyperdense collection at the posterolateral aspect of the distal stump measuring approximately 2.6 x 1.7 x 2.6 cm (series 4, image 54), which could represent a hematoma. No soft tissue gas. Extensive vascular calcifications. IMPRESSION: 1. Status post below-knee amputation of the right lower extremity. Subtle  loss of the cortical definition of the distal amputation margin of the tibia, raises the suspicion for early acute osteomyelitis. 2. Swelling and edema at the distal stump with possible cutaneous blister. There is a more focal hyperdense collection at the posterolateral aspect of the distal stump measuring approximately 2.6 x 1.7 x 2.6 cm, which could represent a hematoma. 3. Small nonspecific knee joint effusion. Electronically Signed   By: Davina Poke D.O.   On: 12/31/2019 13:23     Flora Lipps, MD  [facility] 01/02/2020

## 2020-01-02 NOTE — Progress Notes (Signed)
Patient nauseous throughout day and had 2 bouts of emesis. Poor PO intake and some meds unable to take. MD Pokhrel notified.

## 2020-01-02 NOTE — Progress Notes (Signed)
[facility] Progress Note   64 y.o.maleDDRT 2015 CKD4, AoCKD Afib DM HTN b/l BKA h/o duodenal adenocarcinoma s/p resection. 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 Currently on Myfortic, Tac and prednisone. Baseline Cr is2.8-3.4(as recently as 10/12/2019) but appears to be slowly worsening as an outpatient. Patient is here with leg pain and states that the pain to the right leg stump has worsened over the past few days but denies any fever.   He stopped taking his medications about a week ago and of note he is on HCO3 3 tabs 4x a day. He denies diarrhea but has had markedly decreased PO intake, no sick contacts, no myalgias, dizziness, syncopal episodes or visual disturbances. He has also not taken his transplant medications in about a week and has ahistory of poor adherence for which he is on a "observation/probation period" prior to relisting for kidney transplant.  Assessment/ Plan:   1. Acute on CKD4 BL cr 2.8-3.4 s/p DDRT with DGF requiring HD x3 as well as subsequent ACR and antibody mediated rejection requiring IVIG as recently as 2018. Renal function appears to be progressively worsening over the past year as well. He appears very dry and I am suspicious that he has not been taking the Creon as well and diarrhea is likely underreported + not taking high dose HCO3 for a week ->severe gap metabolic acidosis as well as AKI on top of already severely compromised renal function. - Will give  IV HCO3 again;  oral HCO3 restarted already  - Renal transplant ultrasound shows no  Obstruction; advanced disease already. - Hold the Demadex for now; good UOP.  - I restarted the  Prograf 3/2 and Myfortic 2 tabs BID; no need to check levels as he hasn't been taking. If no recovery in next week and he's deemed ESRD again then will titrate off.   - Plan on restarting RRT tomorrow if no improvement; fortunately he has a functional rt Cimino. He has no appetite  which may be from uremia.  - Need to start CLIP process tomorrow.  2. DM - poorly controlled. Per primary. 3. HTN - BP actually on low side at this time. 4. Right leg cellulitis r/o abscess/ pustule? 5. Pancreatic insufficiency - should be on Creon 12K units with each meal or snack.  Subjective:   Feeling better except for the right stump. Denies f/c/n/v/dyspnea/ diarrhea, but no appetite   Objective:   BP (!) 97/45 (BP Location: Left Arm)   Pulse 71   Temp 97.7 F (36.5 C) (Oral)   Resp 16   Ht 6' (1.829 m)   Wt 80.7 kg   SpO2 98%   BMI 24.14 kg/m   Intake/Output Summary (Last 24 hours) at 01/02/2020 0645 Last data filed at 01/02/2020 0600 Gross per 24 hour  Intake 730 ml  Output 1100 ml  Net -370 ml   Weight change:   Physical Exam: GEN: NAD, A&Ox3, NCAT HEENT: No conjunctival pallor, EOMI NECK: Supple, no thyromegaly LUNGS: CTA B/L no rales, rhonchi or wheezing CV: RRR, No M/R/G ABD: SNDNT +BS  EXT: B/L BKA; erythema and ? Pustule on rt stump ACCESS: Rt Cimino with good bruit  Imaging: US Renal Transplant w/Doppler  Result Date: 12/31/2019 CLINICAL DATA:  Acute renal failure. History of a renal transplantation. EXAM: ULTRASOUND OF RENAL TRANSPLANT WITH RENAL DOPPLER ULTRASOUND TECHNIQUE: Ultrasound examination of the renal transplant was performed with gray-scale, color and duplex doppler evaluation. COMPARISON:  CT, 04/23/2019. FINDINGS: Transplant kidney location:  RLQ Transplant Kidney: Renal measurements: 11.1 x 4.3 x 5.2 cm = volume: 130.30mL. Normal in size and parenchymal echogenicity. No evidence of mass or hydronephrosis. Trace perinephric fluid adjacent to the lower pole. Color flow in the main renal artery:  Yes Color flow in the main renal vein:  Yes Duplex Doppler Evaluation: Main Renal Artery Resistive Index: 0.87. Velocity, peak systolic, 053.9 centimeters/second. Venous waveform in main renal vein:  Present, 19 centimeters/second Intrarenal resistive  index in upper pole:  0.74 (normal 0.6-0.8; equivocal 0.8-0.9; abnormal >= 0.9) Intrarenal resistive index in lower pole: 0.97 (normal 0.6-0.8; equivocal 0.8-0.9; abnormal >= 0.9) Bladder: Some debris.  No wall thickening, stone or mass. Other findings:  None. IMPRESSION: 1. Abnormally elevated resistive index in the lower pole of the transplant kidney with loss of the diastolic waveform. Resistive index measures 0.97. 2. Trace perinephric fluid adjacent to the lower pole. 3. No other abnormality. Electronically Signed   By: Lajean Manes M.D.   On: 12/31/2019 13:59   CT EXTREMITY LOWER RIGHT WO CONTRAST  Result Date: 12/31/2019 CLINICAL DATA:  Pain and swelling at right BKA stump EXAM: CT OF THE LOWER RIGHT EXTREMITY WITHOUT CONTRAST TECHNIQUE: Multidetector CT imaging of the right lower extremity was performed according to the standard protocol. COMPARISON:  None available FINDINGS: Bones/Joint/Cartilage Patient is status post below-knee amputation of the right lower extremity. Subtle loss of the cortical definition of the distal amputation margin of the tibia (series 9, image 47; series 7, image 82). The residual fibula appears intact with preservation of the distal cortex. The patella and visualized distal femur appear intact. Bones are demineralized. No acute fracture. No dislocation. Joint spaces are relatively preserved. Small nonspecific knee joint effusion. Ligaments Suboptimally assessed by CT. Collateral ligaments appear grossly intact. Muscles and Tendons No acute musculotendinous abnormality. Soft tissues Swelling and edema at the distal stump with possible cutaneous blister (series 8, image 74). There is a more focal slightly hyperdense collection at the posterolateral aspect of the distal stump measuring approximately 2.6 x 1.7 x 2.6 cm (series 4, image 54), which could represent a hematoma. No soft tissue gas. Extensive vascular calcifications. IMPRESSION: 1. Status post below-knee amputation of  the right lower extremity. Subtle loss of the cortical definition of the distal amputation margin of the tibia, raises the suspicion for early acute osteomyelitis. 2. Swelling and edema at the distal stump with possible cutaneous blister. There is a more focal hyperdense collection at the posterolateral aspect of the distal stump measuring approximately 2.6 x 1.7 x 2.6 cm, which could represent a hematoma. 3. Small nonspecific knee joint effusion. Electronically Signed   By: Davina Poke D.O.   On: 12/31/2019 13:23    Labs: BMET Recent Labs  Lab 12/30/19 1819 12/31/19 1115 01/01/20 1100 01/02/20 0353  NA 141 142 143 143  K 3.9 3.7 2.6* 3.6  CL 121* 119* 116* 116*  CO2 7* <7* 10* 8*  GLUCOSE 269* 254* 186* 260*  BUN 80* 92* 93* 97*  CREATININE 4.76* 5.25* 5.29* 5.50*  CALCIUM 8.1* 8.4* 7.7* 7.9*  PHOS  --   --   --  9.0*   CBC Recent Labs  Lab 12/30/19 1819 01/02/20 0353  WBC 13.3* 11.4*  NEUTROABS 10.1*  --   HGB 10.4* 8.0*  HCT 34.2* 25.5*  MCV 92.4 89.5  PLT 161 142*    Medications:    . aspirin EC  81 mg Oral Daily  . atorvastatin  10 mg Oral Q M,W,F  .  calcitRIOL  0.25 mcg Oral Daily  . calcium acetate  1,334 mg Oral TID WC  . cholecalciferol  4,000 Units Oral Daily  . heparin  5,000 Units Subcutaneous Q12H  . hydrALAZINE  50 mg Oral Q6H  . insulin aspart  0-9 Units Subcutaneous TID WC  . lipase/protease/amylase  12,000 Units Oral TID AC  . magnesium chloride  2 tablet Oral BID  . mycophenolate  360 mg Oral BID  . pantoprazole  40 mg Oral Daily  . predniSONE  5 mg Oral Q breakfast  . sertraline  50 mg Oral Daily  . sodium bicarbonate  1,950 mg Oral QID  . tacrolimus  3 mg Oral Daily   And  . tacrolimus  2 mg Oral Daily  . tamsulosin  0.4 mg Oral QPC supper      Otelia Santee, MD 01/02/2020, 6:45 AM

## 2020-01-03 ENCOUNTER — Telehealth: Payer: Self-pay

## 2020-01-03 LAB — CBC
HCT: 25.9 % — ABNORMAL LOW (ref 39.0–52.0)
Hemoglobin: 8.4 g/dL — ABNORMAL LOW (ref 13.0–17.0)
MCH: 28.8 pg (ref 26.0–34.0)
MCHC: 32.4 g/dL (ref 30.0–36.0)
MCV: 88.7 fL (ref 80.0–100.0)
Platelets: 167 10*3/uL (ref 150–400)
RBC: 2.92 MIL/uL — ABNORMAL LOW (ref 4.22–5.81)
RDW: 15.8 % — ABNORMAL HIGH (ref 11.5–15.5)
WBC: 9.6 10*3/uL (ref 4.0–10.5)
nRBC: 0 % (ref 0.0–0.2)

## 2020-01-03 LAB — COMPREHENSIVE METABOLIC PANEL
ALT: 12 U/L (ref 0–44)
AST: 11 U/L — ABNORMAL LOW (ref 15–41)
Albumin: 2 g/dL — ABNORMAL LOW (ref 3.5–5.0)
Alkaline Phosphatase: 59 U/L (ref 38–126)
Anion gap: 16 — ABNORMAL HIGH (ref 5–15)
BUN: 89 mg/dL — ABNORMAL HIGH (ref 8–23)
CO2: 22 mmol/L (ref 22–32)
Calcium: 7.3 mg/dL — ABNORMAL LOW (ref 8.9–10.3)
Chloride: 106 mmol/L (ref 98–111)
Creatinine, Ser: 5.35 mg/dL — ABNORMAL HIGH (ref 0.61–1.24)
GFR calc Af Amer: 12 mL/min — ABNORMAL LOW (ref >60)
GFR calc non Af Amer: 10 mL/min — ABNORMAL LOW (ref >60)
Glucose, Bld: 204 mg/dL — ABNORMAL HIGH (ref 70–99)
Potassium: 3.1 mmol/L — ABNORMAL LOW (ref 3.5–5.1)
Sodium: 144 mmol/L (ref 135–145)
Total Bilirubin: 0.9 mg/dL (ref 0.3–1.2)
Total Protein: 4.7 g/dL — ABNORMAL LOW (ref 6.5–8.1)

## 2020-01-03 LAB — GLUCOSE, CAPILLARY
Glucose-Capillary: 188 mg/dL — ABNORMAL HIGH (ref 70–99)
Glucose-Capillary: 217 mg/dL — ABNORMAL HIGH (ref 70–99)
Glucose-Capillary: 195 mg/dL — ABNORMAL HIGH (ref 70–99)
Glucose-Capillary: 154 mg/dL — ABNORMAL HIGH (ref 70–99)
Glucose-Capillary: 167 mg/dL — ABNORMAL HIGH (ref 70–99)

## 2020-01-03 LAB — MYCOPHENOLIC ACID (CELLCEPT)
MPA Glucuronide: 10 ug/mL — ABNORMAL LOW (ref 15–125)
MPA: <0.1 ug/mL — ABNORMAL LOW (ref 1.0–3.5)

## 2020-01-03 LAB — MAGNESIUM: Magnesium: 1.6 mg/dL — ABNORMAL LOW (ref 1.7–2.4)

## 2020-01-03 LAB — PHOSPHORUS: Phosphorus: 8.5 mg/dL — ABNORMAL HIGH (ref 2.5–4.6)

## 2020-01-03 MED ORDER — CALCIUM ACETATE (PHOS BINDER) 667 MG PO CAPS
2001.0000 mg | ORAL_CAPSULE | Freq: Three times a day (TID) | ORAL | Status: DC
  Administered 2020-01-06 – 2020-01-10 (×11): 2001 mg via ORAL
  Filled 2020-01-03 (×14): qty 3

## 2020-01-03 MED ORDER — SODIUM BICARBONATE 650 MG PO TABS
1300.0000 mg | ORAL_TABLET | Freq: Three times a day (TID) | ORAL | Status: DC
  Administered 2020-01-03 (×3): 1300 mg via ORAL
  Filled 2020-01-03 (×4): qty 2

## 2020-01-03 MED ORDER — POTASSIUM CHLORIDE CRYS ER 20 MEQ PO TBCR
40.0000 meq | EXTENDED_RELEASE_TABLET | Freq: Once | ORAL | Status: AC
  Administered 2020-01-04: 40 meq via ORAL
  Filled 2020-01-03: qty 2

## 2020-01-03 MED ORDER — SODIUM CHLORIDE 0.9 % IV BOLUS
250.0000 mL | Freq: Once | INTRAVENOUS | Status: AC
  Administered 2020-01-03: 250 mL via INTRAVENOUS

## 2020-01-03 NOTE — Progress Notes (Signed)
Patient scheduled for hemodialysis today per RN Linton Flemings unable to get patient today.

## 2020-01-03 NOTE — Consult Note (Addendum)
Reason for Consult:Right below knee amputation blisters and open wounds. Referring Physician: Flora Lipps MD Consulting Physician:Kyasia Steuck [facility]  Orthopedic Diagnosis:Blisters right distal anterior tibia with hemorrhage, right BKA residual stump with posterolateral area of skin slough.  GBM:SXJDB Paul [name] is an 64 y.o. male.Has diabetes, end stage renal failure, bilateral BKAs for chronic infected ulcers 2015, he is status post renal transplant performed at  [facility]. He was admitted 12/30/2019 with worsening kidney function, HgA1c 10. Sed rate 75 with blisters right distal anterior BKA stump. No fever or chills. Peripheral WBC is normal with normal differential. Has apparently been  Non compliant with antirejection medication per notes from [place]. He is on IV antibiotics, CT Scan of the right BKA is significant for dense fluid collection right posterolateral tibia with Subtle changes of the distal tibia periosteum concerning for early Osteomyelitis.    Past Medical History:  Diagnosis Date  . A-fib ([place])   . Anemia    esrd  . Calciphylaxis 05/2013   lower ext  . Cancer ([place])    hx duodenal adenoCa 2002, resected  . Closed left arm fracture 1967  . Corneal abrasion, left   . Depression   . Diabetes mellitus    Type 1 iddm x 11 yrs  . Diabetic neuropathy ([place])   . ESRD on hemodialysis ([place])    Pt has ESRD due to DM.  He had a L upper arm AVF prior to starting HD which was ligated due to L arm steal syndrome.  He started HD in Jan 2014 and did home HD.  The family couldn't cannulate the R arm AVF successfully so by mid 2014 they decided to switch to PD which was done late summer 2014.  In Jan 2015 he was admitted with FTT felt to be due to underdialysis on PD and PD was abandoned and he   . GERD (gastroesophageal reflux disease)   . Glomerulosclerosis, diabetic ([place])   . Heart murmur   . History of blood transfusion   . Hypertension    off of meds due to orthostatic  hypotension  . Kidney transplant recipient   . Open wound of both legs with complication    Pt has had progressive wounds of both LE's including gangrene of the toes and patchy necrosis of the calves.  He has been treated at [facility] by Dr Jerline Pain with hyperbaric O2 5d per week.  He is getting Na Thiosulfate with HD for suspected calciphylaxis. Pt says doctor's aren't sure if these ulcers were diabetic ulcers or calciphylaxis.  He underwent bilat BKA on 07/30/13.  He says that the woun    Past Surgical History:  Procedure Laterality Date  . AMPUTATION Bilateral 07/30/2013   Procedure: AMPUTATION BELOW KNEE;  Surgeon: Newt Minion, MD;  Location: [place];  Service: Orthopedics;  Laterality: Bilateral;  Bilateral Below Knee Amputations  . AMPUTATION Bilateral 09/03/2013   Procedure: AMPUTATION BELOW KNEE;  Surgeon: Newt Minion, MD;  Location: [place];  Service: Orthopedics;  Laterality: Bilateral;  Bilateral Below Knee Amputation Revision  . AV FISTULA PLACEMENT  06/07/2011   Procedure: ARTERIOVENOUS (AV) FISTULA CREATION;  Surgeon: Angelia Mould, MD;  Location: [place];  Service: Vascular;  Laterality: Left;  . AV FISTULA PLACEMENT  06/18/2012   Procedure: ARTERIOVENOUS (AV) FISTULA CREATION;  Surgeon: Mal Misty, MD;  Location: [place];  Service: Vascular;  Laterality: Right;  [place]  . BILIARY DIVERSION, EXTERNAL  2002  . BONE MARROW  BIOPSY  2012  . CAPD INSERTION N/A 01/14/2013   Procedure: LAPAROSCOPIC INSERTION CONTINUOUS AMBULATORY PERITONEAL DIALYSIS  (CAPD) CATHETER;  Surgeon: Adin Hector, MD;  Location: [place];  Service: General;  Laterality: N/A;  . CAPD REMOVAL N/A 07/09/2013   Procedure: CONTINUOUS AMBULATORY PERITONEAL DIALYSIS  (CAPD) CATHETER REMOVAL;  Surgeon: Adin Hector, MD;  Location: [place];  Service: General;  Laterality: N/A;  . COLONOSCOPY     Hx: of  . INSERTION OF DIALYSIS CATHETER  05/22/2012   Procedure: INSERTION OF DIALYSIS CATHETER;  Surgeon: Mal Misty, MD;  Location: [place];  Service: Vascular;  Laterality: N/A;  right internal jugular vein  . IR ANGIO INTRA EXTRACRAN SEL COM CAROTID INNOMINATE BILAT MOD SED  10/28/2018  . IR ANGIO VERTEBRAL SEL SUBCLAVIAN INNOMINATE UNI R MOD SED  10/28/2018  . IR ANGIO VERTEBRAL SEL VERTEBRAL UNI L MOD SED  10/28/2018  . LIGATION OF ARTERIOVENOUS  FISTULA  05/22/2012   Procedure: LIGATION OF ARTERIOVENOUS  FISTULA;  Surgeon: Mal Misty, MD;  Location: [place];  Service: Vascular;  Laterality: Left;  Left brachio-cephalic fistula  . LOWER EXTREMITY ANGIOGRAM N/A 06/14/2013   Procedure: LOWER EXTREMITY ANGIOGRAM;  Surgeon: Angelia Mould, MD;  Location: [facility] CATH LAB;  Service: Cardiovascular;  Laterality: N/A;  . NEPHRECTOMY TRANSPLANTED ORGAN    . OTHER SURGICAL HISTORY     Cyst removed from back  . PORTACATH PLACEMENT  2002  . RADIOLOGY WITH ANESTHESIA N/A 10/28/2018   Procedure: IR WITH ANESTHESIA;  Surgeon: Radiologist, Medication, MD;  Location: [place];  Service: Radiology;  Laterality: N/A;  . REMOVAL OF A DIALYSIS CATHETER    . RENAL BIOPSY, PERCUTANEOUS  2012  . TONSILLECTOMY    . VASCULAR SURGERY    . WHIPPLE PROCEDURE  2002   duodenal ca, Dr. Harlow Asa    Family History  Problem Relation Age of Onset  . Cancer Mother   . Cancer Father        prostate cancer  . Heart disease Maternal Grandfather   . Stroke Paternal Grandmother     Social History:  reports that he has never smoked. He has never used smokeless tobacco. He reports that he does not drink alcohol and does not use drugs.  Allergies:  Allergies  Allergen Reactions  . Metformin And Related Other (See Comments)    Has kidney problems    Medications:  Prior to Admission:  Medications Prior to Admission  Medication Sig Dispense Refill Last Dose  . acetaminophen (TYLENOL) 500 MG tablet Take 500 mg by mouth every 6 (six) hours as needed for mild pain or moderate pain.   Past Week at Unknown time  . amLODipine (NORVASC)  10 MG tablet Take 10 mg by mouth daily.   Past Week at Unknown time  . aspirin 81 MG chewable tablet Chew 81 mg by mouth daily.   Past Week at Unknown time  . atorvastatin (LIPITOR) 10 MG tablet Take 1 tablet (10 mg total) by mouth daily. (Patient taking differently: Take 10 mg by mouth every Monday, Wednesday, and Friday. ) 30 tablet 1 Past Week at Unknown time  . calcitRIOL (ROCALTROL) 0.25 MCG capsule Take 0.25 mcg by mouth daily.    Past Week at Unknown time  . calcium acetate (PHOSLO) 667 MG capsule Take 1,334 mg by mouth 3 (three) times daily with meals.   Past Week at Unknown time  . CVS D3 50 MCG (2000 UT) CAPS Take 4,000  Units by mouth daily.   Past Week at Unknown time  . ELIQUIS 5 MG TABS tablet TAKE 1 TABLET BY MOUTH TWICE A DAY (Patient taking differently: Take 5 mg by mouth 2 (two) times daily. ) 60 tablet 4 Past Week at Unknown time  . feeding supplement, GLUCERNA SHAKE, (GLUCERNA SHAKE) LIQD Take 237 mLs by mouth 3 (three) times daily between meals.   Past Month at Unknown time  . HUMALOG KWIKPEN 100 UNIT/ML KwikPen Inject 4-8 Units into the skin daily. Sliding Scale   Past Week at Unknown time  . hydrALAZINE (APRESOLINE) 100 MG tablet Take 100 mg by mouth 2 (two) times daily. Afternoon, bedtime   Past Week at Unknown time  . Insulin Glargine (BASAGLAR KWIKPEN) 100 UNIT/ML SOPN Inject 10 Units into the skin daily.    Past Week at Unknown time  . magnesium chloride (SLOW-MAG) 64 MG TBEC SR tablet Take 2 tablets by mouth 2 (two) times daily. Take at least 2 hours separate from tacrolimus.   Past Week at Unknown time  . mycophenolate (MYFORTIC) 180 MG EC tablet Take 360 mg by mouth 2 (two) times daily.    Past Week at Unknown time  . omeprazole (PRILOSEC) 40 MG capsule Take 40 mg by mouth at bedtime.    Past Week at Unknown time  . predniSONE (DELTASONE) 5 MG tablet Take 5 mg by mouth daily with breakfast.    Past Week at Unknown time  . sertraline (ZOLOFT) 50 MG tablet Take 50 mg by  mouth at bedtime.    Past Week at Unknown time  . sodium bicarbonate 650 MG tablet Take 1,950 mg by mouth 4 (four) times daily.   Past Week at Unknown time  . tacrolimus (PROGRAF) 1 MG capsule Take 2-3 mg by mouth See admin instructions. Take 3 mg by mouth in the morning at 9 AM and 2 mg in the evening at 9 PM   Past Week at Unknown time  . tamsulosin (FLOMAX) 0.4 MG CAPS capsule Take 0.4 mg by mouth daily after supper.    Past Week at Unknown time   Scheduled: . aspirin EC  81 mg Oral Daily  . atorvastatin  10 mg Oral Q M,W,F  . calcium acetate  2,001 mg Oral TID WC  . Chlorhexidine Gluconate Cloth  6 each Topical Q0600  . cholecalciferol  4,000 Units Oral Daily  . heparin  5,000 Units Subcutaneous Q12H  . hydrALAZINE  50 mg Oral Q6H  . insulin aspart  0-9 Units Subcutaneous TID WC  . lipase/protease/amylase  12,000 Units Oral TID AC  . magnesium chloride  2 tablet Oral BID  . mycophenolate  360 mg Oral BID  . pantoprazole  40 mg Oral Daily  . potassium chloride  40 mEq Oral Once  . predniSONE  5 mg Oral Q breakfast  . sertraline  50 mg Oral Daily  . sodium bicarbonate  1,300 mg Oral TID  . tacrolimus  3 mg Oral Daily   And  . tacrolimus  2 mg Oral Daily  . tamsulosin  0.4 mg Oral QPC supper   Continuous: . ceFEPime (MAXIPIME) IV 2 g (01/02/20 1308)  . vancomycin 1,000 mg (01/02/20 1356)   [id number] **OR** acetaminophen, hydrALAZINE, HYDROmorphone (DILAUDID) injection, ondansetron (ZOFRAN) IV  Results for orders placed or performed during the hospital encounter of 12/30/19 (from the past 48 hour(s))  Glucose, capillary     Status: Abnormal   Collection Time: 01/01/20 10:12 PM  Result Value  Ref Range   Glucose-Capillary 230 (H) 70 - 99 mg/dL    Comment: Glucose reference range applies only to samples taken after fasting for at least 8 hours.  Comprehensive metabolic panel     Status: Abnormal   Collection Time: 01/02/20  3:53 AM  Result Value Ref Range   Sodium 143  135 - 145 mmol/L   Potassium 3.6 3.5 - 5.1 mmol/L   Chloride 116 (H) 98 - 111 mmol/L   CO2 8 (L) 22 - 32 mmol/L   Glucose, Bld 260 (H) 70 - 99 mg/dL    Comment: Glucose reference range applies only to samples taken after fasting for at least 8 hours.   BUN 97 (H) 8 - 23 mg/dL   Creatinine, Ser 5.50 (H) 0.61 - 1.24 mg/dL   Calcium 7.9 (L) 8.9 - 10.3 mg/dL   Total Protein 4.8 (L) 6.5 - 8.1 g/dL   Albumin 2.1 (L) 3.5 - 5.0 g/dL   AST 14 (L) 15 - 41 U/L   ALT 14 0 - 44 U/L   Alkaline Phosphatase 52 38 - 126 U/L   Total Bilirubin 1.0 0.3 - 1.2 mg/dL   GFR calc non Af Amer 10 (L) >60 mL/min   GFR calc Af Amer 12 (L) >60 mL/min   Anion gap 19 (H) 5 - 15    Comment: Performed at [facility], [place] [address], [place], [place] [address]  Magnesium     Status: Abnormal   Collection Time: 01/02/20  3:53 AM  Result Value Ref Range   Magnesium 1.6 (L) 1.7 - 2.4 mg/dL    Comment: Performed at [facility] [address] [place],  [address]  Phosphorus     Status: Abnormal   Collection Time: 01/02/20  3:53 AM  Result Value Ref Range   Phosphorus 9.0 (H) 2.5 - 4.6 mg/dL    Comment: Performed at [facility] [address] [place], [place] [address]  CBC     Status: Abnormal   Collection Time: 01/02/20  3:53 AM  Result Value Ref Range   WBC 11.4 (H) 4.0 - 10.5 K/uL   RBC 2.85 (L) 4.22 - 5.81 MIL/uL   Hemoglobin 8.0 (L) 13.0 - 17.0 g/dL   HCT 25.5 (L) 39 - 52 %   MCV 89.5 80.0 - 100.0 fL   MCH 28.1 26.0 - 34.0 pg   MCHC 31.4 30.0 - 36.0 g/dL   RDW 15.5 11.5 - 15.5 %   Platelets 142 (L) 150 - 400 K/uL   nRBC 0.0 0.0 - 0.2 %    Comment: Performed at [facility], [place] [address], [place], [place] [address]  Glucose, capillary     Status: Abnormal   Collection Time: 01/02/20  6:59 AM  Result Value Ref Range   Glucose-Capillary 256 (H) 70 - 99 mg/dL    Comment: Glucose reference range applies only to samples taken after fasting for at least 8 hours.   Glucose, capillary     Status: Abnormal   Collection Time: 01/02/20 12:24 PM  Result Value Ref Range   Glucose-Capillary 173 (H) 70 - 99 mg/dL    Comment: Glucose reference range applies only to samples taken after fasting for at least 8 hours.  Glucose, capillary     Status: Abnormal   Collection Time: 01/02/20  5:26 PM  Result Value Ref Range   Glucose-Capillary 188 (H) 70 - 99 mg/dL    Comment: Glucose reference  range applies only to samples taken after fasting for at least 8 hours.  Glucose, capillary     Status: Abnormal   Collection Time: 01/02/20 10:07 PM  Result Value Ref Range   Glucose-Capillary 168 (H) 70 - 99 mg/dL    Comment: Glucose reference range applies only to samples taken after fasting for at least 8 hours.  Comprehensive metabolic panel     Status: Abnormal   Collection Time: 01/03/20  3:05 AM  Result Value Ref Range   Sodium 144 135 - 145 mmol/L   Potassium 3.1 (L) 3.5 - 5.1 mmol/L   Chloride 106 98 - 111 mmol/L   CO2 22 22 - 32 mmol/L   Glucose, Bld 204 (H) 70 - 99 mg/dL    Comment: Glucose reference range applies only to samples taken after fasting for at least 8 hours.   BUN 89 (H) 8 - 23 mg/dL   Creatinine, Ser 5.35 (H) 0.61 - 1.24 mg/dL   Calcium 7.3 (L) 8.9 - 10.3 mg/dL   Total Protein 4.7 (L) 6.5 - 8.1 g/dL   Albumin 2.0 (L) 3.5 - 5.0 g/dL   AST 11 (L) 15 - 41 U/L   ALT 12 0 - 44 U/L   Alkaline Phosphatase 59 38 - 126 U/L   Total Bilirubin 0.9 0.3 - 1.2 mg/dL   GFR calc non Af Amer 10 (L) >60 mL/min   GFR calc Af Amer 12 (L) >60 mL/min   Anion gap 16 (H) 5 - 15    Comment: Performed at [facility], [place] [address] [place], [place] [address]  Magnesium     Status: Abnormal   Collection Time: 01/03/20  3:05 AM  Result Value Ref Range   Magnesium 1.6 (L) 1.7 - 2.4 mg/dL    Comment: Performed at [facility] [address] [place], [place] [address]  Phosphorus     Status: Abnormal   Collection Time: 01/03/20  3:05 AM  Result Value  Ref Range   Phosphorus 8.5 (H) 2.5 - 4.6 mg/dL    Comment: Performed at [facility] [address], [place], [place] [address]  CBC     Status: Abnormal   Collection Time: 01/03/20  3:05 AM  Result Value Ref Range   WBC 9.6 4.0 - 10.5 K/uL   RBC 2.92 (L) 4.22 - 5.81 MIL/uL   Hemoglobin 8.4 (L) 13.0 - 17.0 g/dL   HCT 25.9 (L) 39 - 52 %   MCV 88.7 80.0 - 100.0 fL   MCH 28.8 26.0 - 34.0 pg   MCHC 32.4 30.0 - 36.0 g/dL   RDW 15.8 (H) 11.5 - 15.5 %   Platelets 167 150 - 400 K/uL   nRBC 0.0 0.0 - 0.2 %    Comment: Performed at [facility], [place] [address] [place], [place] [address]  Glucose, capillary     Status: Abnormal   Collection Time: 01/03/20  6:33 AM  Result Value Ref Range   Glucose-Capillary 217 (H) 70 - 99 mg/dL    Comment: Glucose reference range applies only to samples taken after fasting for at least 8 hours.  Glucose, capillary     Status: Abnormal   Collection Time: 01/03/20 11:00 AM  Result Value Ref Range   Glucose-Capillary 195 (H) 70 - 99 mg/dL    Comment: Glucose reference range applies only to samples taken after fasting for at least 8 hours.  Glucose, capillary     Status:  Abnormal   Collection Time: 01/03/20  4:16 PM  Result Value Ref Range   Glucose-Capillary 154 (H) 70 - 99 mg/dL    Comment: Glucose reference range applies only to samples taken after fasting for at least 8 hours.    No results found.  ROS Blood pressure (!) 122/59, pulse 72, temperature 98 F (36.7 C), temperature source Oral, resp. rate 18, height 6' (1.829 m), weight 80.7 kg, SpO2 99 %. Physical Exam  Orthopaedic Exam: Bilateral BKAs right is short residual limb with no erythrema, minimal swelling present, no warm to palpation. There is a superficial anterior distal tibia bullae about 3cm in diameter with settled blood in the anterior proximal aspect of the bullae. There right posterolateral aspect of the stump is a desqamated area, likely an old blister, no  purulence, No areas of posterior fluctuance. The right knee with minimal  Swelling and minimal effusion, no warm, no erythrema. He is tender to palpation of the right distal residual limb.  Narrative & Impression  CLINICAL DATA:  Pain and swelling at right BKA stump  EXAM: CT OF THE LOWER RIGHT EXTREMITY WITHOUT CONTRAST  TECHNIQUE: Multidetector CT imaging of the right lower extremity was performed according to the standard protocol.  COMPARISON:  None available  FINDINGS: Bones/Joint/Cartilage  Patient is status post below-knee amputation of the right lower extremity. Subtle loss of the cortical definition of the distal amputation margin of the tibia (series 9, image 47; series 7, image 82). The residual fibula appears intact with preservation of the distal cortex. The patella and visualized distal femur appear intact. Bones are demineralized. No acute fracture. No dislocation. Joint spaces are relatively preserved. Small nonspecific knee joint effusion.  Ligaments  Suboptimally assessed by CT. Collateral ligaments appear grossly intact.  Muscles and Tendons  No acute musculotendinous abnormality.  Soft tissues  Swelling and edema at the distal stump with possible cutaneous blister (series 8, image 74). There is a more focal slightly hyperdense collection at the posterolateral aspect of the distal stump measuring approximately 2.6 x 1.7 x 2.6 cm (series 4, image 54), which could represent a hematoma. No soft tissue gas. Extensive vascular calcifications.  IMPRESSION: 1. Status post below-knee amputation of the right lower extremity. Subtle loss of the cortical definition of the distal amputation margin of the tibia, raises the suspicion for early acute osteomyelitis. 2. Swelling and edema at the distal stump with possible cutaneous blister. There is a more focal hyperdense collection at the posterolateral aspect of the distal stump measuring  approximately 2.6 x 1.7 x 2.6 cm, which could represent a hematoma. 3. Small nonspecific knee joint effusion.   Electronically Signed   By: Davina Poke D.O.   On: 12/31/2019 13:23    Assessment/Plan: Blisters over the end of the right BKA residual limb tibia and fibula. These may represent friction blisters due to poorly fitting Prosthesis but given his recent renal transplant with the need of  Anti-rejection medications and diabetes he is compromised in Terms of his capacity to react to infection. Still I would recommend MRI of the right BKA to verify if osteomyelitis is present as he has a small residual limb and the need to extend The amputation to an above knee level would have to be considered if osteomyelitis is present. Dr. Sharol Given plans to be  Back on Weds or Thurs and I will discuss this with him.    Plan: Will order CRP, procalcitonin level and MRI right proximal tibia   Basil Dess  01/03/2020, 8:40 PM

## 2020-01-03 NOTE — Progress Notes (Signed)
Pharmacy Antibiotic Note  Paul [name] is a 64 y.o. male admitted on 12/30/2019 with early osteomyelitis. Of note, the patient has a history of CKD4, kidney transplant and bilateral BKA. Pharmacy has been consulted for vancomycin and cefepime dosing.  Plan: - Continue Vancomycin 1000 mg q48h, no loading dose - Target a goal vancomycin trough of 15-20 mcg/mL - Continue Cefepime 2 g q24h  - Monitor renal function, clinical status, cultures and length of therapy - Deescalate therapy as needed  Vancomycin dose was chosen without a loading dose due to the patient's CKD, current renal function, history of kidney transplant and bilateral BKA.  Height: 6' (182.9 cm) Weight: 80.7 kg (178 lb) IBW/kg (Calculated) : 77.6  Temp (24hrs), Avg:97.6 F (36.4 C), Min:97.5 F (36.4 C), Max:97.8 F (36.6 C)  Recent Labs  Lab 12/30/19 1819 12/30/19 1822 12/31/19 1115 01/01/20 1100 01/02/20 0353 01/03/20 0305  WBC 13.3*  --   --   --  11.4* 9.6  CREATININE 4.76*  --  5.25* 5.29* 5.50* 5.35*  LATICACIDVEN  --  0.5  --   --   --   --     Estimated Creatinine Clearance: 14.9 mL/min (A) (by C-G formula based on SCr of 5.35 mg/dL (H)).    Allergies  Allergen Reactions  . Metformin And Related Other (See Comments)    Has kidney problems    Antimicrobials this admission: 8/13 vancomycin >> 8/13 cefepime >>  Microbiology results: None  Paul [name] A. Levada Dy, PharmD, BCPS, FNKF Clinical Pharmacist [place] Please utilize Amion for appropriate phone number to reach the unit pharmacist ([place])  .

## 2020-01-03 NOTE — Telephone Encounter (Signed)
Hilbert Odor PA with [facility] on triage phone stating he has been trying to get ahold on Dr. Louanne Skye about this pt. He would like a return phone call asap.  CB # [phone]

## 2020-01-03 NOTE — Consult Note (Signed)
Reason for Consult:RLE cellulitis Referring Physician: L Pokhrel  Paul [name] is an 64 y.o. male.  HPI: Blu began to have stump pain on his right side early last week. It rapidly got worse and he began to have drainage from it on Thursday. He came to the hospital and was admitted on Friday. He has been receiving abx but is no better. He denies fevers, chills, sweats, N/V.  Past Medical History:  Diagnosis Date  . A-fib ([place])   . Anemia    esrd  . Calciphylaxis 05/2013   lower ext  . Cancer ([place])    hx duodenal adenoCa 2002, resected  . Closed left arm fracture 1967  . Corneal abrasion, left   . Depression   . Diabetes mellitus    Type 1 iddm x 11 yrs  . Diabetic neuropathy ([place])   . ESRD on hemodialysis ([place])    Pt has ESRD due to DM.  He had a L upper arm AVF prior to starting HD which was ligated due to L arm steal syndrome.  He started HD in Jan 2014 and did home HD.  The family couldn't cannulate the R arm AVF successfully so by mid 2014 they decided to switch to PD which was done late summer 2014.  In Jan 2015 he was admitted with FTT felt to be due to underdialysis on PD and PD was abandoned and he   . GERD (gastroesophageal reflux disease)   . Glomerulosclerosis, diabetic ([place])   . Heart murmur   . History of blood transfusion   . Hypertension    off of meds due to orthostatic hypotension  . Kidney transplant recipient   . Open wound of both legs with complication    Pt has had progressive wounds of both LE's including gangrene of the toes and patchy necrosis of the calves.  He has been treated at [facility] by Dr Jerline Pain with hyperbaric O2 5d per week.  He is getting Na Thiosulfate with HD for suspected calciphylaxis. Pt says doctor's aren't sure if these ulcers were diabetic ulcers or calciphylaxis.  He underwent bilat BKA on 07/30/13.  He says that the woun    Past Surgical History:  Procedure Laterality Date  . AMPUTATION Bilateral 07/30/2013   Procedure: AMPUTATION  BELOW KNEE;  Surgeon: Newt Minion, MD;  Location: [place];  Service: Orthopedics;  Laterality: Bilateral;  Bilateral Below Knee Amputations  . AMPUTATION Bilateral 09/03/2013   Procedure: AMPUTATION BELOW KNEE;  Surgeon: Newt Minion, MD;  Location: [place];  Service: Orthopedics;  Laterality: Bilateral;  Bilateral Below Knee Amputation Revision  . AV FISTULA PLACEMENT  06/07/2011   Procedure: ARTERIOVENOUS (AV) FISTULA CREATION;  Surgeon: Angelia Mould, MD;  Location: [place];  Service: Vascular;  Laterality: Left;  . AV FISTULA PLACEMENT  06/18/2012   Procedure: ARTERIOVENOUS (AV) FISTULA CREATION;  Surgeon: Mal Misty, MD;  Location: [place];  Service: Vascular;  Laterality: Right;  [place]  . BILIARY DIVERSION, EXTERNAL  2002  . BONE MARROW BIOPSY  2012  . CAPD INSERTION N/A 01/14/2013   Procedure: LAPAROSCOPIC INSERTION CONTINUOUS AMBULATORY PERITONEAL DIALYSIS  (CAPD) CATHETER;  Surgeon: Adin Hector, MD;  Location: [place];  Service: General;  Laterality: N/A;  . CAPD REMOVAL N/A 07/09/2013   Procedure: CONTINUOUS AMBULATORY PERITONEAL DIALYSIS  (CAPD) CATHETER REMOVAL;  Surgeon: Adin Hector, MD;  Location: [place];  Service: General;  Laterality: N/A;  . COLONOSCOPY     Hx: of  .  INSERTION OF DIALYSIS CATHETER  05/22/2012   Procedure: INSERTION OF DIALYSIS CATHETER;  Surgeon: Mal Misty, MD;  Location: [place];  Service: Vascular;  Laterality: N/A;  right internal jugular vein  . IR ANGIO INTRA EXTRACRAN SEL COM CAROTID INNOMINATE BILAT MOD SED  10/28/2018  . IR ANGIO VERTEBRAL SEL SUBCLAVIAN INNOMINATE UNI R MOD SED  10/28/2018  . IR ANGIO VERTEBRAL SEL VERTEBRAL UNI L MOD SED  10/28/2018  . LIGATION OF ARTERIOVENOUS  FISTULA  05/22/2012   Procedure: LIGATION OF ARTERIOVENOUS  FISTULA;  Surgeon: Mal Misty, MD;  Location: [place];  Service: Vascular;  Laterality: Left;  Left brachio-cephalic fistula  . LOWER EXTREMITY ANGIOGRAM N/A 06/14/2013   Procedure: LOWER EXTREMITY ANGIOGRAM;   Surgeon: Angelia Mould, MD;  Location: [facility] CATH LAB;  Service: Cardiovascular;  Laterality: N/A;  . NEPHRECTOMY TRANSPLANTED ORGAN    . OTHER SURGICAL HISTORY     Cyst removed from back  . PORTACATH PLACEMENT  2002  . RADIOLOGY WITH ANESTHESIA N/A 10/28/2018   Procedure: IR WITH ANESTHESIA;  Surgeon: Radiologist, Medication, MD;  Location: [place];  Service: Radiology;  Laterality: N/A;  . REMOVAL OF A DIALYSIS CATHETER    . RENAL BIOPSY, PERCUTANEOUS  2012  . TONSILLECTOMY    . VASCULAR SURGERY    . WHIPPLE PROCEDURE  2002   duodenal ca, Dr. Harlow Asa    Family History  Problem Relation Age of Onset  . Cancer Mother   . Cancer Father        prostate cancer  . Heart disease Maternal Grandfather   . Stroke Paternal Grandmother     Social History:  reports that he has never smoked. He has never used smokeless tobacco. He reports that he does not drink alcohol and does not use drugs.  Allergies:  Allergies  Allergen Reactions  . Metformin And Related Other (See Comments)    Has kidney problems    Medications: I have reviewed the patient's current medications.  Results for orders placed or performed during the hospital encounter of 12/30/19 (from the past 48 hour(s))  Glucose, capillary     Status: Abnormal   Collection Time: 01/01/20 11:38 AM  Result Value Ref Range   Glucose-Capillary 170 (H) 70 - 99 mg/dL    Comment: Glucose reference range applies only to samples taken after fasting for at least 8 hours.  Glucose, capillary     Status: Abnormal   Collection Time: 01/01/20  4:41 PM  Result Value Ref Range   Glucose-Capillary 214 (H) 70 - 99 mg/dL    Comment: Glucose reference range applies only to samples taken after fasting for at least 8 hours.  Glucose, capillary     Status: Abnormal   Collection Time: 01/01/20 10:12 PM  Result Value Ref Range   Glucose-Capillary 230 (H) 70 - 99 mg/dL    Comment: Glucose reference range applies only to samples taken after fasting  for at least 8 hours.  Comprehensive metabolic panel     Status: Abnormal   Collection Time: 01/02/20  3:53 AM  Result Value Ref Range   Sodium 143 135 - 145 mmol/L   Potassium 3.6 3.5 - 5.1 mmol/L   Chloride 116 (H) 98 - 111 mmol/L   CO2 8 (L) 22 - 32 mmol/L   Glucose, Bld 260 (H) 70 - 99 mg/dL    Comment: Glucose reference range applies only to samples taken after fasting for at least 8 hours.   BUN 97 (H)  8 - 23 mg/dL   Creatinine, Ser 5.02 (H) 0.61 - 1.24 mg/dL   Calcium 7.9 (L) 8.9 - 10.3 mg/dL   Total Protein 4.8 (L) 6.5 - 8.1 g/dL   Albumin 2.1 (L) 3.5 - 5.0 g/dL   AST 14 (L) 15 - 41 U/L   ALT 14 0 - 44 U/L   Alkaline Phosphatase 52 38 - 126 U/L   Total Bilirubin 1.0 0.3 - 1.2 mg/dL   GFR calc non Af Amer 10 (L) >60 mL/min   GFR calc Af Amer 12 (L) >60 mL/min   Anion gap 19 (H) 5 - 15    Comment: Performed at [facility], [address], [place], [place] [address]  Magnesium     Status: Abnormal   Collection Time: 01/02/20  3:53 AM  Result Value Ref Range   Magnesium 1.6 (L) 1.7 - 2.4 mg/dL    Comment: Performed at [facility], [address] [place], [place] [address]  Phosphorus     Status: Abnormal   Collection Time: 01/02/20  3:53 AM  Result Value Ref Range   Phosphorus 9.0 (H) 2.5 - 4.6 mg/dL    Comment: Performed at [facility], [address], [place], [place] [address]  CBC     Status: Abnormal   Collection Time: 01/02/20  3:53 AM  Result Value Ref Range   WBC 11.4 (H) 4.0 - 10.5 K/uL   RBC 2.85 (L) 4.22 - 5.81 MIL/uL   Hemoglobin 8.0 (L) 13.0 - 17.0 g/dL   HCT 95.7 (L) 39 - 52 %   MCV 89.5 80.0 - 100.0 fL   MCH 28.1 26.0 - 34.0 pg   MCHC 31.4 30.0 - 36.0 g/dL   RDW 90.0 92.0 - 04.1 %   Platelets 142 (L) 150 - 400 K/uL   nRBC 0.0 0.0 - 0.2 %    Comment: Performed at [facility], [address], [place], [place] [address]  Glucose, capillary     Status: Abnormal   Collection Time: 01/02/20  6:59 AM  Result Value Ref Range    Glucose-Capillary 256 (H) 70 - 99 mg/dL    Comment: Glucose reference range applies only to samples taken after fasting for at least 8 hours.  Glucose, capillary     Status: Abnormal   Collection Time: 01/02/20 12:24 PM  Result Value Ref Range   Glucose-Capillary 173 (H) 70 - 99 mg/dL    Comment: Glucose reference range applies only to samples taken after fasting for at least 8 hours.  Glucose, capillary     Status: Abnormal   Collection Time: 01/02/20  5:26 PM  Result Value Ref Range   Glucose-Capillary 188 (H) 70 - 99 mg/dL    Comment: Glucose reference range applies only to samples taken after fasting for at least 8 hours.  Glucose, capillary     Status: Abnormal   Collection Time: 01/02/20 10:07 PM  Result Value Ref Range   Glucose-Capillary 168 (H) 70 - 99 mg/dL    Comment: Glucose reference range applies only to samples taken after fasting for at least 8 hours.  Comprehensive metabolic panel     Status: Abnormal   Collection Time: 01/03/20  3:05 AM  Result Value Ref Range   Sodium 144 135 - 145 mmol/L   Potassium 3.1 (L) 3.5 - 5.1 mmol/L   Chloride 106 98 - 111 mmol/L   CO2 22 22 - 32 mmol/L   Glucose, Bld  204 (H) 70 - 99 mg/dL    Comment: Glucose reference range applies only to samples taken after fasting for at least 8 hours.   BUN 89 (H) 8 - 23 mg/dL   Creatinine, Ser 5.35 (H) 0.61 - 1.24 mg/dL   Calcium 7.3 (L) 8.9 - 10.3 mg/dL   Total Protein 4.7 (L) 6.5 - 8.1 g/dL   Albumin 2.0 (L) 3.5 - 5.0 g/dL   AST 11 (L) 15 - 41 U/L   ALT 12 0 - 44 U/L   Alkaline Phosphatase 59 38 - 126 U/L   Total Bilirubin 0.9 0.3 - 1.2 mg/dL   GFR calc non Af Amer 10 (L) >60 mL/min   GFR calc Af Amer 12 (L) >60 mL/min   Anion gap 16 (H) 5 - 15    Comment: Performed at [facility], [place] [address] [place], [place] [address]  Magnesium     Status: Abnormal   Collection Time: 01/03/20  3:05 AM  Result Value Ref Range   Magnesium 1.6 (L) 1.7 - 2.4 mg/dL    Comment: Performed at [place] [address], [place],  [address]  Phosphorus     Status: Abnormal   Collection Time: 01/03/20  3:05 AM  Result Value Ref Range   Phosphorus 8.5 (H) 2.5 - 4.6 mg/dL    Comment: Performed at [place] [address] [place], [place] [address]  CBC     Status: Abnormal   Collection Time: 01/03/20  3:05 AM  Result Value Ref Range   WBC 9.6 4.0 - 10.5 K/uL   RBC 2.92 (L) 4.22 - 5.81 MIL/uL   Hemoglobin 8.4 (L) 13.0 - 17.0 g/dL   HCT 25.9 (L) 39 - 52 %   MCV 88.7 80.0 - 100.0 fL   MCH 28.8 26.0 - 34.0 pg   MCHC 32.4 30.0 - 36.0 g/dL   RDW 15.8 (H) 11.5 - 15.5 %   Platelets 167 150 - 400 K/uL   nRBC 0.0 0.0 - 0.2 %    Comment: Performed at [facility], [place] [address] [place], [place] [address]  Glucose, capillary     Status: Abnormal   Collection Time: 01/03/20  6:33 AM  Result Value Ref Range   Glucose-Capillary 217 (H) 70 - 99 mg/dL    Comment: Glucose reference range applies only to samples taken after fasting for at least 8 hours.  Glucose, capillary     Status: Abnormal   Collection Time: 01/03/20 11:00 AM  Result Value Ref Range   Glucose-Capillary 195 (H) 70 - 99 mg/dL    Comment: Glucose reference range applies only to samples taken after fasting for at least 8 hours.    No results found.  Review of Systems  Constitutional: Negative for chills, diaphoresis and fever.  HENT: Negative for ear discharge, ear pain, hearing loss and tinnitus.   Eyes: Negative for photophobia and pain.  Respiratory: Negative for cough and shortness of breath.   Cardiovascular: Negative for chest pain.  Gastrointestinal: Negative for abdominal pain, nausea and vomiting.  Genitourinary: Negative for dysuria, flank pain, frequency and urgency.  Musculoskeletal: Positive for arthralgias (RLE). Negative for back pain, myalgias and neck pain.  Neurological: Negative for dizziness and headaches.  Hematological: Does not bruise/bleed easily.   Psychiatric/Behavioral: The patient is not nervous/anxious.    Blood pressure (!) 141/63, pulse 73, temperature (!) 97.5 F (36.4 C), temperature source Oral, resp. rate 18, height  6' (1.829 m), weight 80.7 kg, SpO2 98 %. Physical Exam Constitutional:      General: He is not in acute distress.    Appearance: He is well-developed. He is not diaphoretic.  HENT:     Head: Normocephalic and atraumatic.  Eyes:     General: No scleral icterus.       Right eye: No discharge.        Left eye: No discharge.     Conjunctiva/sclera: Conjunctivae normal.  Cardiovascular:     Rate and Rhythm: Normal rate and regular rhythm.  Pulmonary:     Effort: Pulmonary effort is normal. No respiratory distress.  Musculoskeletal:     Cervical back: Normal range of motion.     Comments: RLE No traumatic wounds, ecchymosis, or rash  Small wound posterior aspect of stump with purulent discharge and patchy erythema    Skin:    General: Skin is warm and dry.  Neurological:     Mental Status: He is alert.  Psychiatric:        Behavior: Behavior normal.     Assessment/Plan: RLE stump infection -- Will need I&D, possible revision. Multiple medical problems including kidney transplant recipient (2016) with CKD stage IV, hypertension, IDDM, chronic A. fib on Xarelto, B/L BKA secondary to PVD -- per primary service    Lisette Abu, PA-C Orthopedic Surgery [phone] 01/03/2020, 11:29 AM

## 2020-01-03 NOTE — Care Management Important Message (Signed)
Important Message  Patient Details  Name: Paul [name] MRN: [id number] Date of Birth: 1956/03/25   Medicare Important Message Given:  Yes  Patient could not sign.  Patient asked me to sign on his behalf and a copy was left.    Lilou Kneip 01/03/2020, 3:21 PM

## 2020-01-03 NOTE — Progress Notes (Signed)
[facility] Progress Note   64 y.o.maleDDRT 2015 CKD4, AoCKD Afib DM HTN b/l BKA h/o duodenal adenocarcinoma s/p resection. 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 Currently on Myfortic, Tac and prednisone. Baseline Cr is2.8-3.4(as recently as 10/12/2019) but appears to be slowly worsening as an outpatient. Patient is here with leg pain and states that the pain to the right leg stump has worsened over the past few days but denies any fever.   Assessment/ Plan:   1. AKI on CKD4: BL cr 2.8-3.4 s/p DDRT with DGF complicated by ACR and antibody mediated rejection requiring IVIG as recently as 2018.  Renal function slowly worsening with time.  Urine output seems to be improving 1. Consider dialysis today but given improving urine output and resolved acidosis can hold off for now 2. Continue tacrolimus, mycophenolate, prednisone 2. Anion gap metabolic acidosis and non-anion gap metabolic acidosis: Secondary to renal failure and possibly diarrhea not taking Creon consistently.  Greatly improved today with IV and oral bicarbonate as well as resolving kidney injury.  With bicarb level 22.  Continue oral bicarbonate 1300 mg 3 times daily. 2. DM - poorly controlled. Per primary. 3. HTN -continue medications as ordered 4. Pancreatic insufficiency - should be on Creon 12K units with each meal or snack. 5. Hyperphosphatemia: Continues to be significantly elevated with a level of 8.5 today.  Increase PhosLo to 2g and hold calcitriol  Subjective:   Patient states he feels well today without significant issues.  Urine output seems to be improving.  800 cc of urine output and 5 unmeasured urines.   Objective:   BP (!) 141/63 (BP Location: Left Arm)   Pulse 73   Temp (!) 97.5 F (36.4 C) (Oral)   Resp 18   Ht 6' (1.829 m)   Wt 80.7 kg   SpO2 98%   BMI 24.14 kg/m   Intake/Output Summary (Last 24 hours) at 01/03/2020 1424 Last data filed at 01/03/2020 1300 Gross  per 24 hour  Intake 1178.28 ml  Output 800 ml  Net 378.28 ml   Weight change:   Physical Exam: GEN: NAD, lying in bed HEENT: No nasal discharge, moist mucous membranes LUNGS: CTA B/L no rales, rhonchi or wheezing CV: Normal rate, no murmurs ABD: Positive bowel sounds, no distention EXT: B/L BKA; right stump covered ACCESS: Rt Cimino with good bruit  Imaging: No results found.  Labs: BMET Recent Labs  Lab 12/30/19 1819 12/31/19 1115 01/01/20 1100 01/02/20 0353 01/03/20 0305  NA 141 142 143 143 144  K 3.9 3.7 2.6* 3.6 3.1*  CL 121* 119* 116* 116* 106  CO2 7* <7* 10* 8* 22  GLUCOSE 269* 254* 186* 260* 204*  BUN 80* 92* 93* 97* 89*  CREATININE 4.76* 5.25* 5.29* 5.50* 5.35*  CALCIUM 8.1* 8.4* 7.7* 7.9* 7.3*  PHOS  --   --   --  9.0* 8.5*   CBC Recent Labs  Lab 12/30/19 1819 01/02/20 0353 01/03/20 0305  WBC 13.3* 11.4* 9.6  NEUTROABS 10.1*  --   --   HGB 10.4* 8.0* 8.4*  HCT 34.2* 25.5* 25.9*  MCV 92.4 89.5 88.7  PLT 161 142* 167    Medications:    . aspirin EC  81 mg Oral Daily  . atorvastatin  10 mg Oral Q M,W,F  . calcitRIOL  0.25 mcg Oral Daily  . calcium acetate  1,334 mg Oral TID WC  . Chlorhexidine Gluconate Cloth  6 each Topical Q0600  . cholecalciferol  4,000 Units Oral Daily  . heparin  5,000 Units Subcutaneous Q12H  . hydrALAZINE  50 mg Oral Q6H  . insulin aspart  0-9 Units Subcutaneous TID WC  . lipase/protease/amylase  12,000 Units Oral TID AC  . magnesium chloride  2 tablet Oral BID  . mycophenolate  360 mg Oral BID  . pantoprazole  40 mg Oral Daily  . potassium chloride  40 mEq Oral Once  . predniSONE  5 mg Oral Q breakfast  . sertraline  50 mg Oral Daily  . sodium bicarbonate  1,300 mg Oral TID  . tacrolimus  3 mg Oral Daily   And  . tacrolimus  2 mg Oral Daily  . tamsulosin  0.4 mg Oral QPC supper      Reesa Chew  01/03/2020, 2:24 PM

## 2020-01-03 NOTE — Progress Notes (Addendum)
Inpatient Diabetes Program Recommendations  AACE/ADA: New Consensus Statement on Inpatient Glycemic Control (2015)  Target Ranges:  Prepandial:   less than 140 mg/dL      Peak postprandial:   less than 180 mg/dL (1-2 hours)      Critically ill patients:  140 - 180 mg/dL   Lab Results  Component Value Date   GLUCAP 217 (H) 01/03/2020   HGBA1C 10.3 (H) 12/31/2019    Review of Glycemic Control Results for Paul [name], Paul [name] (MRN [id number]) as of 01/03/2020 11:01  Ref. Range 01/02/2020 06:59 01/02/2020 12:24 01/02/2020 17:26 01/02/2020 22:07 01/03/2020 06:33  Glucose-Capillary Latest Ref Range: 70 - 99 mg/dL 256 (H) 173 (H) 188 (H) 168 (H) 217 (H)   Diabetes history: DM2  Outpatient Diabetes medications:  Basaglar 10 units daily Humalog 4-8 units tid  Current orders for Inpatient glycemic control:  Novolog 0-9 units tid Prednisone 5 mg daily  Inpatient Diabetes Program Recommendations:     Lantus 8 units daily (80% of home dose)   Addendum @ 1500:  Spoke with patient at bedside.  Reviewed patient's current A1c of 10.3%. Explained what a A1c is and what it measures. Also reviewed goal A1c with patient, importance of good glucose control @ home, and blood sugar goals.  He admits to not checking his blood sugar enough.  When asked how many times a day he checks, he simply replies, "not enough".  He admits to drinking sugary beverages and not watching his CHO intake.  He does give himself his insulins as prescribed in the arms.  Expressed importance of rotating sites.  Expressed importance of lowering his blood sugar for optimal healing and decreased risk of infections.  He verbalizes understanding.  He is very withdrawn and answers with very few words.      Will continue to follow while inpatient.  Thank you, Reche Dixon, RN, BSN Diabetes Coordinator Inpatient Diabetes Program [phone] (team pager from 8a-5p)

## 2020-01-03 NOTE — Progress Notes (Signed)
PROGRESS NOTE  [name] ZDG:[id number] DOB: [birth year] DOA: 12/30/2019 PCP: [name], MD   LOS: 3 days   Brief narrative: As per HPI,  Paul [name] is a 64 y.o. male with medical history significant of kidney transplant recipient (2016) with CKD stage IV, hypertension, IDDM, chronic A. fib on Xarelto, B/L BKA secondary to PVD, presented with new onset of right BKA stump pain and rash about 1 week prior to presentation attributed that to a unfit prosthetic which might caused a skin abrasions.  He started to notice significant pain rash associated the end of right leg stump about 3 days ago.  Pain has been constant and gradually getting worse, denies any fever chills.  Patient has been following with [facility] for the kidney transplant, he was seen in the office about 2 weeks ago and multiple blood work and urine analysis sent, urine protein/Cre>5,000, patient however was not informed about results yet.  Patient claims his kidney function has been stable. In the ED, Creatinine was 5.2 compared to 3.5 at baseline, bicarb less than 7 compared to 12 last week, WBC 13.3, glucose 254.   CT scan of the right lower extremity showed suspicion for early acute osteomyelitis involve the distal tibia, distal fluid collection hematoma versus abscess. Patient was then admitted to the hospital for further evaluation and treatment  Assessment/Plan:  Active Problems:   AKI (acute kidney injury) ([place])   Cellulitis  Sepsis secondary to right leg osteomyelitis and abscess on BKA stump site -Present on admission. Sepsis as evidenced by leukocytosis, worsening renal function, CT scan of the extremity showing evidence of early osteomyelitis and possible abscess as source of infection. On vancomycin and cefepime. Pharmacy to dose. Orthopedic surgery Dr Sharol Given was notified by ER and I have notified orthopedic team today. Patient will likely need incision and drainage. Continue to hold Eliquis at this  time.  AKI on CKD stage IV. History of renal transplant.  Severe metabolic acidosis. Nephrology on board. Received bicarb drip for severe anion gap metabolic acidosis. History of renal transplant. Continue tacrolimus, prednisone and mycophenolate. Continue to monitor creatinine levels.  Creatinine levels plateauing.  Nephrology on board.  Might need renal replacement therapy.  Hypokalemia.  Will replace.  Check BMP in a.m.  Diabetes mellitus type II with hyperglycemia Secondary to sepsis. Continue sliding scale insulin, Accu-Cheks diabetic diet. Will monitor blood glucose levels. Hemoglobin A1c of 10.3.  Latest POC glucose of 256.  On low-dose prednisone as well.  Mild hypomagnesemia today.  Magnesium 1.6.  Continue replacement.  History of pancreatic insufficiency. History of duodenal adenocarcinoma status post resection. Added Creon on 01/01/2020  Essential hypertension -Hold clonidine, continue hydralazine with parameters.  Blood pressure borderline low.  Chronic A. Fib -Rate controlled, hold Eliquis for now.  Hyperlipidemia Continue statin   DVT prophylaxis: heparin injection 5,000 Units Start: 12/31/19 1330  Code Status: Full code  Family Communication: None  Status is: Inpatient  Remains inpatient appropriate because:Unsafe d/c plan, IV treatments appropriate due to intensity of illness or inability to take PO and Inpatient level of care appropriate due to severity of illness   Dispo: The patient is from: Home              Anticipated d/c is to: Home              Anticipated d/c date is: 3 days              Patient currently is not medically  stable to d/c.  Consultants:  Orthopedics  Nephrology  Procedures:  None yet  Antibiotics:  . Vancomycin and cefepime IV 8/13>  Anti-infectives (From admission, onward)   Start     Dose/Rate Route Frequency Ordered Stop   12/31/19 1400  vancomycin (VANCOCIN) IVPB 1000 mg/200 mL premix     Discontinue     1,000  mg 200 mL/hr over 60 Minutes Intravenous Every 48 hours 12/31/19 1347     12/31/19 1345  ceFEPIme (MAXIPIME) 2 g in sodium chloride 0.9 % 100 mL IVPB     Discontinue     2 g 200 mL/hr over 30 Minutes Intravenous Every 24 hours 12/31/19 1347     12/31/19 1330  vancomycin (VANCOCIN) IVPB 1000 mg/200 mL premix  Status:  Discontinued        1,000 mg 200 mL/hr over 60 Minutes Intravenous  Once 12/31/19 1315 12/31/19 1347      Subjective: Today, patient was seen and examined at bedside.  Patient complains of pain over the stump area with some nausea.  Objective: Vitals:   01/02/20 2133 01/03/20 0453  BP: 136/60 (!) 141/63  Pulse: 66 73  Resp: 18 18  Temp: 97.8 F (36.6 C) (!) 97.5 F (36.4 C)  SpO2: 98% 98%    Intake/Output Summary (Last 24 hours) at 01/03/2020 1153 Last data filed at 01/03/2020 0900 Gross per 24 hour  Intake 1198.28 ml  Output 800 ml  Net 398.28 ml   Filed Weights   12/31/19 1119  Weight: 80.7 kg   Body mass index is 24.14 kg/m.   Physical Exam: GENERAL: Patient is alert awake and oriented. Not in obvious distress. Thinly built HENT: No scleral pallor or icterus. Pupils equally reactive to light. Oral mucosa is moist NECK: is supple, no gross swelling noted. CHEST: Clear to auscultation. No crackles or wheezes.  Diminished breath sounds bilaterally. CVS: S1 and S2 heard, no murmur. Regular rate and rhythm.  ABDOMEN: Soft, non-tender, bowel sounds are present. EXTREMITIES: Right distal BKA stump with erythematous rash with blister. Tenderness on palpation. Left below-knee amputation - stump is healthy. CNS: Cranial nerves are intact. No focal motor deficits. SKIN: warm and dry, right distal BKA stump with erythema and blister.  Slightly decreased redness today.  Data Review: I have personally reviewed the following laboratory data and studies,  CBC: Recent Labs  Lab 12/30/19 1819 01/02/20 0353 01/03/20 0305  WBC 13.3* 11.4* 9.6  NEUTROABS 10.1*  --    --   HGB 10.4* 8.0* 8.4*  HCT 34.2* 25.5* 25.9*  MCV 92.4 89.5 88.7  PLT 161 142* 599   Basic Metabolic Panel: Recent Labs  Lab 12/30/19 1819 12/31/19 1115 01/01/20 1100 01/02/20 0353 01/03/20 0305  NA 141 142 143 143 144  K 3.9 3.7 2.6* 3.6 3.1*  CL 121* 119* 116* 116* 106  CO2 7* <7* 10* 8* 22  GLUCOSE 269* 254* 186* 260* 204*  BUN 80* 92* 93* 97* 89*  CREATININE 4.76* 5.25* 5.29* 5.50* 5.35*  CALCIUM 8.1* 8.4* 7.7* 7.9* 7.3*  MG  --   --   --  1.6* 1.6*  PHOS  --   --   --  9.0* 8.5*   Liver Function Tests: Recent Labs  Lab 12/30/19 1819 01/01/20 1100 01/02/20 0353 01/03/20 0305  AST 10* 16 14* 11*  ALT 11 13 14 12   ALKPHOS 59 50 52 59  BILITOT 0.7 0.9 1.0 0.9  PROT 5.7* 4.6* 4.8* 4.7*  ALBUMIN 2.6* 2.0*  2.1* 2.0*   No results for input(s): LIPASE, AMYLASE in the last 168 hours. No results for input(s): AMMONIA in the last 168 hours. Cardiac Enzymes: Recent Labs  Lab 01/01/20 1100  CKTOTAL 56   BNP (last 3 results) Recent Labs    10/12/19 0011  BNP 1,980.9*    ProBNP (last 3 results) No results for input(s): PROBNP in the last 8760 hours.  CBG: Recent Labs  Lab 01/02/20 1224 01/02/20 1726 01/02/20 2207 01/03/20 0633 01/03/20 1100  GLUCAP 173* 188* 168* 217* 195*   Recent Results (from the past 240 hour(s))  SARS Coronavirus 2 by RT PCR (hospital order, performed in [facility] hospital lab) Nasopharyngeal Nasopharyngeal Swab     Status: None   Collection Time: 12/31/19 11:15 AM   Specimen: Nasopharyngeal Swab  Result Value Ref Range Status   SARS Coronavirus 2 NEGATIVE NEGATIVE Final    Comment: (NOTE) SARS-CoV-2 target nucleic acids are NOT DETECTED.  The SARS-CoV-2 RNA is generally detectable in upper and lower respiratory specimens during the acute phase of infection. The lowest concentration of SARS-CoV-2 viral copies this assay can detect is 250 copies / mL. A negative result does not preclude SARS-CoV-2 infection and should  not be used as the sole basis for treatment or other patient management decisions.  A negative result may occur with improper specimen collection / handling, submission of specimen other than nasopharyngeal swab, presence of viral mutation(s) within the areas targeted by this assay, and inadequate number of viral copies (<250 copies / mL). A negative result must be combined with clinical observations, patient history, and epidemiological information.  Fact Sheet for Patients:   StrictlyIdeas.no  Fact Sheet for Healthcare Providers: BankingDealers.co.za  This test is not yet approved or  cleared by the [place] FDA and has been authorized for detection and/or diagnosis of SARS-CoV-2 by FDA under an Emergency Use Authorization (EUA).  This EUA will remain in effect (meaning this test can be used) for the duration of the COVID-19 declaration under Section 564(b)(1) of the Act, 21 U.S.C. section 360bbb-3(b)(1), unless the authorization is terminated or revoked sooner.  Performed at [facility], [place] [address] [place], [place] [address]      Studies: No results found.   Flora Lipps, MD  [facility] 01/03/2020

## 2020-01-03 NOTE — Telephone Encounter (Signed)
Dr. Louanne Skye called after clinic today, regarding patients infected stump

## 2020-01-04 ENCOUNTER — Inpatient Hospital Stay (HOSPITAL_COMMUNITY): Payer: Medicare Other

## 2020-01-04 ENCOUNTER — Telehealth: Payer: Self-pay

## 2020-01-04 DIAGNOSIS — A419 Sepsis, unspecified organism: Secondary | ICD-10-CM | POA: Diagnosis present

## 2020-01-04 DIAGNOSIS — I4819 Other persistent atrial fibrillation: Secondary | ICD-10-CM

## 2020-01-04 DIAGNOSIS — I4891 Unspecified atrial fibrillation: Secondary | ICD-10-CM

## 2020-01-04 LAB — CBC
HCT: 24.1 % — ABNORMAL LOW (ref 39.0–52.0)
Hemoglobin: 7.6 g/dL — ABNORMAL LOW (ref 13.0–17.0)
MCH: 28.7 pg (ref 26.0–34.0)
MCHC: 31.5 g/dL (ref 30.0–36.0)
MCV: 90.9 fL (ref 80.0–100.0)
Platelets: 169 10*3/uL (ref 150–400)
RBC: 2.65 MIL/uL — ABNORMAL LOW (ref 4.22–5.81)
RDW: 16.1 % — ABNORMAL HIGH (ref 11.5–15.5)
WBC: 8.6 10*3/uL (ref 4.0–10.5)
nRBC: 0 % (ref 0.0–0.2)

## 2020-01-04 LAB — RENAL FUNCTION PANEL
Albumin: 1.8 g/dL — ABNORMAL LOW (ref 3.5–5.0)
Anion gap: 18 — ABNORMAL HIGH (ref 5–15)
BUN: 92 mg/dL — ABNORMAL HIGH (ref 8–23)
CO2: 13 mmol/L — ABNORMAL LOW (ref 22–32)
Calcium: 7.2 mg/dL — ABNORMAL LOW (ref 8.9–10.3)
Chloride: 115 mmol/L — ABNORMAL HIGH (ref 98–111)
Creatinine, Ser: 5.45 mg/dL — ABNORMAL HIGH (ref 0.61–1.24)
GFR calc Af Amer: 12 mL/min — ABNORMAL LOW (ref >60)
GFR calc non Af Amer: 10 mL/min — ABNORMAL LOW (ref >60)
Glucose, Bld: 194 mg/dL — ABNORMAL HIGH (ref 70–99)
Phosphorus: 8 mg/dL — ABNORMAL HIGH (ref 2.5–4.6)
Potassium: 3 mmol/L — ABNORMAL LOW (ref 3.5–5.1)
Sodium: 146 mmol/L — ABNORMAL HIGH (ref 135–145)

## 2020-01-04 LAB — APTT: aPTT: 32 seconds (ref 24–36)

## 2020-01-04 LAB — TROPONIN I (HIGH SENSITIVITY)
Troponin I (High Sensitivity): 55 ng/L — ABNORMAL HIGH (ref <18)
Troponin I (High Sensitivity): 56 ng/L — ABNORMAL HIGH (ref <18)

## 2020-01-04 LAB — COMPREHENSIVE METABOLIC PANEL
ALT: 13 U/L (ref 0–44)
AST: 9 U/L — ABNORMAL LOW (ref 15–41)
Albumin: 1.8 g/dL — ABNORMAL LOW (ref 3.5–5.0)
Alkaline Phosphatase: 56 U/L (ref 38–126)
Anion gap: 17 — ABNORMAL HIGH (ref 5–15)
BUN: 102 mg/dL — ABNORMAL HIGH (ref 8–23)
CO2: 16 mmol/L — ABNORMAL LOW (ref 22–32)
Calcium: 7.7 mg/dL — ABNORMAL LOW (ref 8.9–10.3)
Chloride: 111 mmol/L (ref 98–111)
Creatinine, Ser: 6.11 mg/dL — ABNORMAL HIGH (ref 0.61–1.24)
GFR calc Af Amer: 10 mL/min — ABNORMAL LOW (ref >60)
GFR calc non Af Amer: 9 mL/min — ABNORMAL LOW (ref >60)
Glucose, Bld: 240 mg/dL — ABNORMAL HIGH (ref 70–99)
Potassium: 4.1 mmol/L (ref 3.5–5.1)
Sodium: 144 mmol/L (ref 135–145)
Total Bilirubin: 0.7 mg/dL (ref 0.3–1.2)
Total Protein: 4.6 g/dL — ABNORMAL LOW (ref 6.5–8.1)

## 2020-01-04 LAB — GLUCOSE, CAPILLARY
Glucose-Capillary: 203 mg/dL — ABNORMAL HIGH (ref 70–99)
Glucose-Capillary: 161 mg/dL — ABNORMAL HIGH (ref 70–99)
Glucose-Capillary: 162 mg/dL — ABNORMAL HIGH (ref 70–99)

## 2020-01-04 LAB — MAGNESIUM
Magnesium: 1.7 mg/dL (ref 1.7–2.4)
Magnesium: 2.3 mg/dL (ref 1.7–2.4)

## 2020-01-04 LAB — IRON AND TIBC
Iron: 90 ug/dL (ref 45–182)
Saturation Ratios: 81 % — ABNORMAL HIGH (ref 17.9–39.5)
TIBC: 111 ug/dL — ABNORMAL LOW (ref 250–450)
UIBC: 21 ug/dL

## 2020-01-04 LAB — C-REACTIVE PROTEIN: CRP: 5.9 mg/dL — ABNORMAL HIGH (ref <1.0)

## 2020-01-04 LAB — HEPARIN LEVEL (UNFRACTIONATED)
Heparin Unfractionated: <0.1 IU/mL — ABNORMAL LOW (ref 0.30–0.70)
Heparin Unfractionated: 0.19 IU/mL — ABNORMAL LOW (ref 0.30–0.70)

## 2020-01-04 LAB — PROTIME-INR
INR: 1.2 (ref 0.8–1.2)
Prothrombin Time: 14.7 seconds (ref 11.4–15.2)

## 2020-01-04 LAB — HEPATITIS B SURFACE ANTIGEN: Hepatitis B Surface Ag: NONREACTIVE

## 2020-01-04 LAB — HEPATITIS B CORE ANTIBODY, TOTAL: Hep B Core Total Ab: NONREACTIVE

## 2020-01-04 LAB — TACROLIMUS LEVEL: Tacrolimus (FK506) - LabCorp: <1 ng/mL — ABNORMAL LOW (ref 2.0–20.0)

## 2020-01-04 LAB — FERRITIN: Ferritin: 657 ng/mL — ABNORMAL HIGH (ref 24–336)

## 2020-01-04 MED ORDER — POTASSIUM CHLORIDE 10 MEQ/100ML IV SOLN
10.0000 meq | INTRAVENOUS | Status: AC
  Administered 2020-01-04 (×2): 10 meq via INTRAVENOUS
  Filled 2020-01-04 (×2): qty 100

## 2020-01-04 MED ORDER — AMIODARONE HCL 200 MG PO TABS: 400.0000 mg | ORAL_TABLET | Freq: Two times a day (BID) | ORAL | Status: DC

## 2020-01-04 MED ORDER — AMIODARONE IV BOLUS ONLY 150 MG/100ML
150.0000 mg | Freq: Once | INTRAVENOUS | Status: AC
  Administered 2020-01-04: 150 mg via INTRAVENOUS
  Filled 2020-01-04: qty 100

## 2020-01-04 MED ORDER — AMIODARONE HCL 200 MG PO TABS: 200.0000 mg | ORAL_TABLET | Freq: Every day | ORAL | Status: DC

## 2020-01-04 MED ORDER — POTASSIUM CHLORIDE CRYS ER 20 MEQ PO TBCR
40.0000 meq | EXTENDED_RELEASE_TABLET | Freq: Once | ORAL | Status: AC
  Filled 2020-01-04: qty 2

## 2020-01-04 MED ORDER — LACTATED RINGERS IV BOLUS
250.0000 mL | Freq: Once | INTRAVENOUS | Status: AC
  Administered 2020-01-04: 250 mL via INTRAVENOUS

## 2020-01-04 MED ORDER — AMIODARONE HCL IN DEXTROSE 360-4.14 MG/200ML-% IV SOLN
30.0000 mg/h | INTRAVENOUS | Status: DC
  Administered 2020-01-04 – 2020-01-05 (×2): 30 mg/h via INTRAVENOUS
  Filled 2020-01-04 (×2): qty 200

## 2020-01-04 MED ORDER — AMIODARONE HCL IN DEXTROSE 360-4.14 MG/200ML-% IV SOLN
60.0000 mg/h | INTRAVENOUS | Status: DC
  Administered 2020-01-04: 60 mg/h via INTRAVENOUS
  Filled 2020-01-04: qty 200

## 2020-01-04 MED ORDER — MAGNESIUM SULFATE IN D5W 1-5 GM/100ML-% IV SOLN
1.0000 g | Freq: Once | INTRAVENOUS | Status: AC
  Administered 2020-01-04: 1 g via INTRAVENOUS
  Filled 2020-01-04: qty 100

## 2020-01-04 MED ORDER — DIGOXIN 0.25 MG/ML IJ SOLN
0.5000 mg | Freq: Once | INTRAMUSCULAR | Status: DC
  Filled 2020-01-04: qty 2

## 2020-01-04 MED ORDER — VANCOMYCIN HCL IN DEXTROSE 1-5 GM/200ML-% IV SOLN
INTRAVENOUS | Status: AC
  Administered 2020-01-04: 1000 mg
  Filled 2020-01-04: qty 200

## 2020-01-04 MED ORDER — HEPARIN (PORCINE) 25000 UT/250ML-% IV SOLN
1400.0000 [IU]/h | INTRAVENOUS | Status: DC
  Administered 2020-01-04: 1200 [IU]/h via INTRAVENOUS
  Administered 2020-01-05 – 2020-01-06 (×2): 1400 [IU]/h via INTRAVENOUS
  Filled 2020-01-04 (×4): qty 250

## 2020-01-04 NOTE — Progress Notes (Addendum)
[facility] Progress Note   64 y.o.maleDDRT 2015 CKD4, AoCKD Afib DM HTN b/l BKA h/o duodenal adenocarcinoma s/p resection. 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 Currently on Myfortic, Tac and prednisone. Baseline Cr is2.8-3.4(as recently as 10/12/2019) but appears to be slowly worsening as an outpatient. Patient is here with leg pain and states that the pain to the right leg stump has worsened over the past few days but denies any fever.   Assessment/ Plan:   1. AKI on CKD4: BL cr 2.8-3.4 s/p DDRT with DGF complicated by ACR and antibody mediated rejection requiring IVIG as recently as 2018.  Renal function slowly worsening with time.  Urine output seems to be improving 1. Acidosis worsened we will plan on full session of dialysis today likely dialysis dependent 2. Also some signs of uremia, hopefully will improve with dialysis 3. Continue tacrolimus, mycophenolate, prednisone 4. Will ensure clip process has begun 5. Patient has functional dialysis access 2. Anion gap metabolic acidosis and non-anion gap metabolic acidosis: Secondary to renal failure and possibly diarrhea not taking Creon consistently.  Bicarb had improved greatly yesterday up to 22 but back down to 13 last night and 16 today.  22 may have been falsely elevated.  Plan for dialysis as above and continue oral bicarbonate 2. DM - poorly controlled. Per primary. 3. HTN -BP low today. Stopped hydralazine. 4. Pancreatic insufficiency - should be on Creon 12K units with each meal or snack. 5. Hyperphosphatemia: Phosphorus slightly improved 8 today.  Continue PhosLo 2 g 3 times daily with meals and holding calcitriol. 6. Anemia: Hemoglobin 7.6.  Iron saturation 52 when last checked.  We will repeat iron studies and consider starting Aranesp.  Subjective:   Patient minimally interactive today.  Unable to swallow his pills consistently.  Not answering questions.   Objective:   BP 117/63 (BP  Location: Left Arm)   Pulse 72   Temp 98.5 F (36.9 C) (Oral)   Resp 13   Ht 6' (1.829 m)   Wt 80.7 kg   SpO2 98%   BMI 24.14 kg/m   Intake/Output Summary (Last 24 hours) at 01/04/2020 1046 Last data filed at 01/04/2020 0039 Gross per 24 hour  Intake 666.31 ml  Output 100 ml  Net 566.31 ml   Weight change:   Physical Exam: GEN: NAD, lying in bed HEENT: No nasal discharge, moist mucous membranes LUNGS: CTA B/L no rales, rhonchi or wheezing CV: Normal rate, no murmurs ABD: Positive bowel sounds, no distention EXT: B/L BKA; right stump covered ACCESS: Rt Cimino with good bruit  Imaging: DG Chest 1 View  Result Date: 01/04/2020 CLINICAL DATA:  Pulmonary edema EXAM: CHEST  1 VIEW COMPARISON:  10/12/2019 FINDINGS: The heart size and mediastinal contours are within normal limits. Both lungs are clear. The visualized skeletal structures are unremarkable. IMPRESSION: No active disease. Electronically Signed   By: Ulyses Jarred M.D.   On: 01/04/2020 01:21    Labs: BMET Recent Labs  Lab 12/30/19 1819 12/31/19 1115 01/01/20 1100 01/02/20 0353 01/03/20 0305 01/03/20 2306 01/04/20 0727  NA 141 142 143 143 144 146* 144  K 3.9 3.7 2.6* 3.6 3.1* 3.0* 4.1  CL 121* 119* 116* 116* 106 115* 111  CO2 7* <7* 10* 8* 22 13* 16*  GLUCOSE 269* 254* 186* 260* 204* 194* 240*  BUN 80* 92* 93* 97* 89* 92* 102*  CREATININE 4.76* 5.25* 5.29* 5.50* 5.35* 5.45* 6.11*  CALCIUM 8.1* 8.4* 7.7* 7.9* 7.3* 7.2* 7.7*  PHOS  --   --   --  9.0* 8.5* 8.0*  --    CBC Recent Labs  Lab 12/30/19 1819 01/02/20 0353 01/03/20 0305 01/04/20 0727  WBC 13.3* 11.4* 9.6 8.6  NEUTROABS 10.1*  --   --   --   HGB 10.4* 8.0* 8.4* 7.6*  HCT 34.2* 25.5* 25.9* 24.1*  MCV 92.4 89.5 88.7 90.9  PLT 161 142* 167 169    Medications:    . amiodarone  400 mg Oral Q12H   Followed by  . [START ON 01/12/2020] amiodarone  200 mg Oral Daily  . aspirin EC  81 mg Oral Daily  . atorvastatin  10 mg Oral Q M,W,F  .  calcium acetate  2,001 mg Oral TID WC  . Chlorhexidine Gluconate Cloth  6 each Topical Q0600  . cholecalciferol  4,000 Units Oral Daily  . insulin aspart  0-9 Units Subcutaneous TID WC  . lipase/protease/amylase  12,000 Units Oral TID AC  . magnesium chloride  2 tablet Oral BID  . mycophenolate  360 mg Oral BID  . pantoprazole  40 mg Oral Daily  . predniSONE  5 mg Oral Q breakfast  . sertraline  50 mg Oral Daily  . sodium bicarbonate  1,300 mg Oral TID  . tacrolimus  3 mg Oral Daily   And  . tacrolimus  2 mg Oral Daily  . tamsulosin  0.4 mg Oral QPC supper      Reesa Chew  01/04/2020, 10:46 AM

## 2020-01-04 NOTE — Significant Event (Signed)
Rapid Response Event Note   Reason for Call :  Called originally at 2358 for Afib-120s, BP-89/50 triggering a RED MEWS. At that time, RN had orders from MD and did not need RRT. RN called at [facility] requesting assistance obtaining PCU bed.   Initial Focused Assessment:  Pt laying in bed with eyes closed. Pt will open eyes and answer some questions when requested, but is very lethargic. Lungs clear and diminished t/o. Skin warm and dry. T-98.2, HR-132, BP-97/49, RR-18, SpO2-97% on 2L [place].      Interventions:  EKG, RFP, Mg, Trop, 250cc NS bolus, KCL 90meq x1, KCL run IV x 2, Mg 1g IV x 1>all PTA RRT.   250cc LR bolus. Bladder scan done-254cc, PCXR  Will move to PCU and give 150mg  Amiod bolus   Plan of Care:  Tx to [place]. Give amiod bolus and monitor response. Call RRT if further assistance needed.    Event Summary:   MD Notified: Dr. Cyd Silence at bedside on my arrival Call Corn[place], Lawanna Cecere Anderson, RN

## 2020-01-04 NOTE — Progress Notes (Signed)
This note also relates to the following rows which could not be included: BP - Cannot attach notes to unvalidated device data ECG Heart Rate - Cannot attach notes to unvalidated device data Resp - Cannot attach notes to unvalidated device data    01/04/20 0200  Assess: MEWS Score  Temp  (98.4)  Level of Consciousness Alert  SpO2  (98)  O2 Device Nasal Cannula  O2 Flow Rate (L/min) 2 L/min  Assess: MEWS Score  MEWS Temp 0  MEWS Systolic 0  MEWS Pulse 2  MEWS RR 0  MEWS LOC 0  MEWS Score 2  MEWS Score Color Yellow  Assess: if the MEWS score is Yellow or Red  Were vital signs taken at a resting state? Yes  Focused Assessment No change from prior assessment  Early Detection of Sepsis Score *See Row Information* Medium  MEWS guidelines implemented *See Row Information* Yes  Treat  MEWS Interventions Administered scheduled meds/treatments (Potassium, Mag, gtt and Amino gtt started. BP cycled)  Pain Scale 0-10  Pain Score 0  Take Vital Signs  Increase Vital Sign Frequency  Yellow: Q 2hr X 2 then Q 4hr X 2, if remains yellow, continue Q 4hrs  Escalate  MEWS: Escalate Yellow: discuss with charge nurse/RN and consider discussing with provider and RRT (discussed with other team members on [place])

## 2020-01-04 NOTE — Progress Notes (Signed)
Converted back to NSR with amiodarone bolus. Will plan for 24 hours of IV amiodarone therapy and transition to 400 mg Bid PO x 7 days and then 200 mg daily x 21 days and stop. Will plan for 1 month amiodarone therapy to  Maintain sinus rhythm while septic and requiring surgery. No AC as will need surgery. Resume after surgery and once bleeding risk is not an issue.   [place] T. Audie Box, [place]  [address], [place], [place] [address] [phone]  8:01 AM

## 2020-01-04 NOTE — Evaluation (Signed)
Clinical/Bedside Swallow Evaluation Patient Details  Name: Paul [name] MRN: [id number] Date of Birth: 04-18-1956  Today's Date: 01/04/2020 Time: SLP Start Time (ACUTE ONLY): 1455 SLP Stop Time (ACUTE ONLY): 1520 SLP Time Calculation (min) (ACUTE ONLY): 25 min  Past Medical History:  Past Medical History:  Diagnosis Date  . A-fib ([place])   . Anemia    esrd  . Calciphylaxis 05/2013   lower ext  . Cancer ([place])    hx duodenal adenoCa 2002, resected  . Closed left arm fracture 1967  . Corneal abrasion, left   . Depression   . Diabetes mellitus    Type 1 iddm x 11 yrs  . Diabetic neuropathy ([place])   . ESRD on hemodialysis ([place])    Pt has ESRD due to DM.  He had a L upper arm AVF prior to starting HD which was ligated due to L arm steal syndrome.  He started HD in Jan 2014 and did home HD.  The family couldn't cannulate the R arm AVF successfully so by mid 2014 they decided to switch to PD which was done late summer 2014.  In Jan 2015 he was admitted with FTT felt to be due to underdialysis on PD and PD was abandoned and he   . GERD (gastroesophageal reflux disease)   . Glomerulosclerosis, diabetic ([place])   . Heart murmur   . History of blood transfusion   . Hypertension    off of meds due to orthostatic hypotension  . Kidney transplant recipient   . Open wound of both legs with complication    Pt has had progressive wounds of both LE's including gangrene of the toes and patchy necrosis of the calves.  He has been treated at [facility] by Dr Jerline Pain with hyperbaric O2 5d per week.  He is getting Na Thiosulfate with HD for suspected calciphylaxis. Pt says doctor's aren't sure if these ulcers were diabetic ulcers or calciphylaxis.  He underwent bilat BKA on 07/30/13.  He says that the woun   Past Surgical History:  Past Surgical History:  Procedure Laterality Date  . AMPUTATION Bilateral 07/30/2013   Procedure: AMPUTATION BELOW KNEE;  Surgeon: Newt Minion, MD;  Location: [place];  Service:  Orthopedics;  Laterality: Bilateral;  Bilateral Below Knee Amputations  . AMPUTATION Bilateral 09/03/2013   Procedure: AMPUTATION BELOW KNEE;  Surgeon: Newt Minion, MD;  Location: [place];  Service: Orthopedics;  Laterality: Bilateral;  Bilateral Below Knee Amputation Revision  . AV FISTULA PLACEMENT  06/07/2011   Procedure: ARTERIOVENOUS (AV) FISTULA CREATION;  Surgeon: Angelia Mould, MD;  Location: [place];  Service: Vascular;  Laterality: Left;  . AV FISTULA PLACEMENT  06/18/2012   Procedure: ARTERIOVENOUS (AV) FISTULA CREATION;  Surgeon: Mal Misty, MD;  Location: [place];  Service: Vascular;  Laterality: Right;  [place]  . BILIARY DIVERSION, EXTERNAL  2002  . BONE MARROW BIOPSY  2012  . CAPD INSERTION N/A 01/14/2013   Procedure: LAPAROSCOPIC INSERTION CONTINUOUS AMBULATORY PERITONEAL DIALYSIS  (CAPD) CATHETER;  Surgeon: Adin Hector, MD;  Location: [place];  Service: General;  Laterality: N/A;  . CAPD REMOVAL N/A 07/09/2013   Procedure: CONTINUOUS AMBULATORY PERITONEAL DIALYSIS  (CAPD) CATHETER REMOVAL;  Surgeon: Adin Hector, MD;  Location: [place];  Service: General;  Laterality: N/A;  . COLONOSCOPY     Hx: of  . INSERTION OF DIALYSIS CATHETER  05/22/2012   Procedure: INSERTION OF DIALYSIS CATHETER;  Surgeon: Mal Misty, MD;  Location:  [facility];  Service: Vascular;  Laterality: N/A;  right internal jugular vein  . IR ANGIO INTRA EXTRACRAN SEL COM CAROTID INNOMINATE BILAT MOD SED  10/28/2018  . IR ANGIO VERTEBRAL SEL SUBCLAVIAN INNOMINATE UNI R MOD SED  10/28/2018  . IR ANGIO VERTEBRAL SEL VERTEBRAL UNI L MOD SED  10/28/2018  . LIGATION OF ARTERIOVENOUS  FISTULA  05/22/2012   Procedure: LIGATION OF ARTERIOVENOUS  FISTULA;  Surgeon: Mal Misty, MD;  Location: [place];  Service: Vascular;  Laterality: Left;  Left brachio-cephalic fistula  . LOWER EXTREMITY ANGIOGRAM N/A 06/14/2013   Procedure: LOWER EXTREMITY ANGIOGRAM;  Surgeon: Angelia Mould, MD;  Location: [facility] CATH LAB;  Service:  Cardiovascular;  Laterality: N/A;  . NEPHRECTOMY TRANSPLANTED ORGAN    . OTHER SURGICAL HISTORY     Cyst removed from back  . PORTACATH PLACEMENT  2002  . RADIOLOGY WITH ANESTHESIA N/A 10/28/2018   Procedure: IR WITH ANESTHESIA;  Surgeon: Radiologist, Medication, MD;  Location: [place];  Service: Radiology;  Laterality: N/A;  . REMOVAL OF A DIALYSIS CATHETER    . RENAL BIOPSY, PERCUTANEOUS  2012  . TONSILLECTOMY    . VASCULAR SURGERY    . WHIPPLE PROCEDURE  2002   duodenal ca, Dr. Harlow Asa   HPI:  64yo male admitted 12/30/19 with right stump pain and rash. PMH: kidney transplant (2016), CKD4, HTN, IDDM, chronic AFib, B/L BKA due to PVD, GERD, duodenal adenoCA (2002),d epression, ESRD - HD.[name] CXR no active disease   Assessment / Plan / Recommendation Clinical Impression  Pt seen at bedside for limited swallow evaluation. RN reports pt unable to demonstrate ability to take PO meds, keeps eyes closed. Upon arrival of SLP, pt was sleeping, and was difficult to arouse with verbal stimm cold cloth, and sternal rub. Suction was set up and oral care completed. Pt has adequate natural dentition. Oral cavity dry. Pt kept eyes closed throughout. Following oral care, an ice chip was rubbed on pt lips. No attempt to take ice chip orally or lick lips. Pt spat out ice chip placed in his mouth, and was unable to demonstrate ability to drink from a straw. Recommend NPO at this time until pt is more appropriate for PO intake.   SLP Visit Diagnosis: Dysphagia, unspecified (R13.10)    Aspiration Risk  Severe aspiration risk;Risk for inadequate nutrition/hydration    Diet Recommendation NPO   Medication Administration: Via alternative means    Other  Recommendations Oral Care Recommendations: Oral care QID Other Recommendations: Have oral suction available   Follow up Recommendations Other (comment) (TBD)      Frequency and Duration min 2x/week  1 week;2 weeks       Prognosis Prognosis for Safe Diet  Advancement: Good      Swallow Study   General Date of Onset: 12/30/19 HPI: 64yo male admitted 12/30/19 with right stump pain and rash. PMH: kidney transplant (2016), CKD4, HTN, IDDM, chronic AFib, B/L BKA due to PVD, GERD, duodenal adenoCA (2002),d epression, ESRD - HD.[name] CXR no active disease Type of Study: Bedside Swallow Evaluation Previous Swallow Assessment: ST June 2020 for aphasia. No previous BSE Diet Prior to this Study: Regular;Thin liquids Temperature Spikes Noted: No Respiratory Status: Nasal cannula History of Recent Intubation: No Behavior/Cognition: Lethargic/Drowsy;Doesn't follow directions;Requires cueing;Uncooperative Oral Cavity Assessment: Dry Oral Care Completed by SLP: Yes Oral Cavity - Dentition: Adequate natural dentition Vision:  (eyes closed throughout) Self-Feeding Abilities: Total assist Patient Positioning: Upright in bed Baseline Vocal Quality: Normal Volitional Cough:  Cognitively unable to elicit Volitional Swallow: Unable to elicit    Oral/Motor/Sensory Function Overall Oral Motor/Sensory Function:  (unable to assess)   Ice Chips Ice chips: Impaired Oral Phase Impairments: Reduced labial seal;Reduced lingual movement/coordination;Poor awareness of bolus Oral Phase Functional Implications: Right anterior spillage;Oral holding Pharyngeal Phase Impairments:  (no swallow elicited)   Thin Liquid Thin Liquid: Impaired Presentation: Straw Oral Phase Impairments: Poor awareness of bolus (unable to drink from straw) Pharyngeal  Phase Impairments:  (no swallow elicited)    Jolea Dolle B. Quentin Ore, [facility], [place] Speech Language Pathologist Office: [phone]  Shonna Chock 01/04/2020,3:20 PM

## 2020-01-04 NOTE — Progress Notes (Signed)
PROGRESS NOTE  Paul [name] WPY:[id number] DOB: [birth year] DOA: 12/30/2019 PCP: [name], MD   LOS: 4 days   Brief narrative: As per HPI,  Paul [name] is a 64 y.o. male with medical history significant of kidney transplant recipient (2016) with CKD stage IV, hypertension, IDDM, chronic A. fib on Xarelto, B/L BKA secondary to PVD, presented with new onset of right BKA stump pain and rash about 1 week prior to presentation attributed that to a unfit prosthetic which might caused a skin abrasions.  He started to notice significant pain rash associated the end of right leg stump about 3 days ago.  Pain has been constant and gradually getting worse, denied any fever or chills.  Patient has been following with [facility] for the kidney transplant, he was seen in the office about 2 weeks ago and multiple blood work and urine analysis sent, urine protein/Cre>5,000, patient however was not informed about results yet.  Patient claims his kidney function has been stable. In the ED, Creatinine was 5.2 compared to 3.5 at baseline, bicarb less than 7 compared to 12 last week, WBC 13.3, glucose 254.   CT scan of the right lower extremity showed suspicion for early acute osteomyelitis involve the distal tibia, distal fluid collection hematoma versus abscess. Patient was then admitted to the hospital for further evaluation and treatment.  Assessment/Plan:  Active Problems:   AKI (acute kidney injury) ([place])   Cellulitis  Sepsis secondary to right leg osteomyelitis and abscess on BKA stump site Present on admission. Sepsis as evidenced by leukocytosis, worsening renal function, CT scan of the extremity showing evidence of early osteomyelitis and possible abscess as source of infection. On vancomycin and cefepime. Pharmacy dosing. Orthopedic surgery, has seen the patient and recommend MRI of the affected extremity.  CRP mildly elevated.  Patient will likely need incision and drainage. Continue to  hold Eliquis at this time.     AKI on CKD stage IV. History of renal transplant.  Severe metabolic acidosis. Nephrology on board. Received bicarb drip for severe anion gap metabolic acidosis. History of renal transplant. Continue tacrolimus, prednisone and mycophenolate. Continue to monitor creatinine levels.  Trending up creatinine levels.  Patient might need renal replacement therapy during this admission.  Hypokalemia. Replenished orally yesterday.    Diabetes mellitus type II with hyperglycemia  Hemoglobin A1c of 10.3.  On low-dose prednisone.   Mild hypomagnesemia replenish  History of pancreatic insufficiency. History of duodenal adenocarcinoma status post resection. Added Creon on 01/01/2020  Essential hypertension -Hold clonidine, continue hydralazine with parameters.   Hyperlipidemia Continue statin   DVT prophylaxis: Heparin drip  Code Status: Full code  Family Communication: None  Status is: Inpatient  Remains inpatient appropriate because:Unsafe d/c plan, IV treatments appropriate due to intensity of illness or inability to take PO and Inpatient level of care appropriate due to severity of illness, possible need for surgical intervention,    Dispo: The patient is from: Home              Anticipated d/c is to: Home with home PT              Anticipated d/c date is: 3 days              Patient currently is not medically stable to d/c.  Consultants:  Orthopedics  Nephrology  Procedures:  None yet  Antibiotics:  . Vancomycin and cefepime IV 8/13>  Anti-infectives (From admission, onward)   Start  Dose/Rate Route Frequency Ordered Stop   01/04/20 1130  vancomycin (VANCOCIN) 1-5 GM/200ML-% IVPB       Note to Pharmacy: Murriel Hopper   : cabinet override      01/04/20 1130 01/04/20 1134   12/31/19 1400  vancomycin (VANCOCIN) IVPB 1000 mg/200 mL premix     Discontinue     1,000 mg 200 mL/hr over 60 Minutes Intravenous Every 48 hours 12/31/19 1347      12/31/19 1345  ceFEPIme (MAXIPIME) 2 g in sodium chloride 0.9 % 100 mL IVPB     Discontinue     2 g 200 mL/hr over 30 Minutes Intravenous Every 24 hours 12/31/19 1347     12/31/19 1330  vancomycin (VANCOCIN) IVPB 1000 mg/200 mL premix  Status:  Discontinued        1,000 mg 200 mL/hr over 60 Minutes Intravenous  Once 12/31/19 1315 12/31/19 1347     Subjective:  Today, patient was seen and examined at bedside. Complains of mild pain at the stump site. No shortness of breath fever chills  Objective: Vitals:   01/04/20 1100 01/04/20 1130  BP: 108/62 (!) 110/59  Pulse: 76 70  Resp: 13 13  Temp:    SpO2: 100% 100%    Intake/Output Summary (Last 24 hours) at 01/04/2020 1146 Last data filed at 01/04/2020 0039 Gross per 24 hour  Intake 666.31 ml  Output 100 ml  Net 566.31 ml   Filed Weights   12/31/19 1119 01/04/20 0920  Weight: 80.7 kg 65.3 kg   Body mass index is 19.52 kg/m.   Physical Exam:  General: Patient is awake communicative, not in obvious distress, thinly built HENT:   Mild pallor noted, oral mucosa is moist.  Chest:  Clear breath sounds.  Diminished breath sounds bilaterally. No crackles or wheezes.  CVS: S1 &S2 heard. No murmur.  Regular rate and rhythm. Abdomen: Soft, nontender, nondistended.  Bowel sounds are heard.   Extremities: Right below-knee amputation stump with erythematous rash and blister slightly improved erythema.  Tenderness on palpation.  Left below-knee amputation stump healthy Psych: somnolent but communicative.,  CNS:  No cranial nerve deficits.  Moving extremities Skin: Warm and dry.  Right below-knee amputation stump with erythema and blister.  Data Review: I have personally reviewed the following laboratory data and studies,  CBC: Recent Labs  Lab 12/30/19 1819 01/02/20 0353 01/03/20 0305 01/04/20 0727  WBC 13.3* 11.4* 9.6 8.6  NEUTROABS 10.1*  --   --   --   HGB 10.4* 8.0* 8.4* 7.6*  HCT 34.2* 25.5* 25.9* 24.1*  MCV 92.4 89.5  88.7 90.9  PLT 161 142* 167 315   Basic Metabolic Panel: Recent Labs  Lab 01/01/20 1100 01/02/20 0353 01/03/20 0305 01/03/20 2306 01/04/20 0727  NA 143 143 144 146* 144  K 2.6* 3.6 3.1* 3.0* 4.1  CL 116* 116* 106 115* 111  CO2 10* 8* 22 13* 16*  GLUCOSE 186* 260* 204* 194* 240*  BUN 93* 97* 89* 92* 102*  CREATININE 5.29* 5.50* 5.35* 5.45* 6.11*  CALCIUM 7.7* 7.9* 7.3* 7.2* 7.7*  MG  --  1.6* 1.6* 1.7 2.3  PHOS  --  9.0* 8.5* 8.0*  --    Liver Function Tests: Recent Labs  Lab 12/30/19 1819 12/30/19 1819 01/01/20 1100 01/02/20 0353 01/03/20 0305 01/03/20 2306 01/04/20 0727  AST 10*  --  16 14* 11*  --  9*  ALT 11  --  13 14 12   --  13  ALKPHOS  59  --  50 52 59  --  56  BILITOT 0.7  --  0.9 1.0 0.9  --  0.7  PROT 5.7*  --  4.6* 4.8* 4.7*  --  4.6*  ALBUMIN 2.6*   < > 2.0* 2.1* 2.0* 1.8* 1.8*   < > = values in this interval not displayed.   No results for input(s): LIPASE, AMYLASE in the last 168 hours. No results for input(s): AMMONIA in the last 168 hours. Cardiac Enzymes: Recent Labs  Lab 01/01/20 1100  CKTOTAL 56   BNP (last 3 results) Recent Labs    10/12/19 0011  BNP 1,980.9*    ProBNP (last 3 results) No results for input(s): PROBNP in the last 8760 hours.  CBG: Recent Labs  Lab 01/03/20 0633 01/03/20 1100 01/03/20 1616 01/03/20 2118 01/04/20 0836  GLUCAP 217* 195* 154* 167* 203*   Recent Results (from the past 240 hour(s))  SARS Coronavirus 2 by RT PCR (hospital order, performed in [facility] hospital lab) Nasopharyngeal Nasopharyngeal Swab     Status: None   Collection Time: 12/31/19 11:15 AM   Specimen: Nasopharyngeal Swab  Result Value Ref Range Status   SARS Coronavirus 2 NEGATIVE NEGATIVE Final    Comment: (NOTE) SARS-CoV-2 target nucleic acids are NOT DETECTED.  The SARS-CoV-2 RNA is generally detectable in upper and lower respiratory specimens during the acute phase of infection. The lowest concentration of SARS-CoV-2 viral  copies this assay can detect is 250 copies / mL. A negative result does not preclude SARS-CoV-2 infection and should not be used as the sole basis for treatment or other patient management decisions.  A negative result may occur with improper specimen collection / handling, submission of specimen other than nasopharyngeal swab, presence of viral mutation(s) within the areas targeted by this assay, and inadequate number of viral copies (<250 copies / mL). A negative result must be combined with clinical observations, patient history, and epidemiological information.  Fact Sheet for Patients:   StrictlyIdeas.no  Fact Sheet for Healthcare Providers: BankingDealers.co.za  This test is not yet approved or  cleared by the [place] FDA and has been authorized for detection and/or diagnosis of SARS-CoV-2 by FDA under an Emergency Use Authorization (EUA).  This EUA will remain in effect (meaning this test can be used) for the duration of the COVID-19 declaration under Section 564(b)(1) of the Act, 21 U.S.C. section 360bbb-3(b)(1), unless the authorization is terminated or revoked sooner.  Performed at [facility], [place] [address] [place], [place] [address]      Studies: DG Chest 1 View  Result Date: 01/04/2020 CLINICAL DATA:  Pulmonary edema EXAM: CHEST  1 VIEW COMPARISON:  10/12/2019 FINDINGS: The heart size and mediastinal contours are within normal limits. Both lungs are clear. The visualized skeletal structures are unremarkable. IMPRESSION: No active disease. Electronically Signed   By: Ulyses Jarred M.D.   On: 01/04/2020 01:21     Flora Lipps, MD  [facility] 01/04/2020

## 2020-01-04 NOTE — Progress Notes (Signed)
ANTICOAGULATION CONSULT NOTE - Initial Consult  Pharmacy Consult for Heparin Indication: atrial fibrillation  Allergies  Allergen Reactions  . Metformin And Related Other (See Comments)    Has kidney problems    Patient Measurements: Height: 6' (182.9 cm) Weight: 80.7 kg (178 lb) IBW/kg (Calculated) : 77.6  Vital Signs: Temp: 97.8 F (36.6 C) (08/17 0730) Temp Source: Axillary (08/17 0730) BP: 105/62 (08/17 0101) Pulse Rate: 71 (08/17 0730)  Labs: Recent Labs    01/01/20 1100 01/01/20 1100 01/02/20 0353 01/03/20 0305 01/03/20 2306  HGB  --   --  8.0* 8.4*  --   HCT  --   --  25.5* 25.9*  --   PLT  --   --  142* 167  --   CREATININE 5.29*   < > 5.50* 5.35* 5.45*  CKTOTAL 56  --   --   --   --   TROPONINIHS  --   --   --   --  55*   < > = values in this interval not displayed.    Estimated Creatinine Clearance: 14.6 mL/min (A) (by C-G formula based on SCr of 5.45 mg/dL (H)).   Medical History: Past Medical History:  Diagnosis Date  . A-fib ([place])   . Anemia    esrd  . Calciphylaxis 05/2013   lower ext  . Cancer ([place])    hx duodenal adenoCa 2002, resected  . Closed left arm fracture 1967  . Corneal abrasion, left   . Depression   . Diabetes mellitus    Type 1 iddm x 11 yrs  . Diabetic neuropathy ([place])   . ESRD on hemodialysis ([place])    Pt has ESRD due to DM.  He had a L upper arm AVF prior to starting HD which was ligated due to L arm steal syndrome.  He started HD in Jan 2014 and did home HD.  The family couldn't cannulate the R arm AVF successfully so by mid 2014 they decided to switch to PD which was done late summer 2014.  In Jan 2015 he was admitted with FTT felt to be due to underdialysis on PD and PD was abandoned and he   . GERD (gastroesophageal reflux disease)   . Glomerulosclerosis, diabetic ([place])   . Heart murmur   . History of blood transfusion   . Hypertension    off of meds due to orthostatic hypotension  . Kidney transplant recipient   .  Open wound of both legs with complication    Pt has had progressive wounds of both LE's including gangrene of the toes and patchy necrosis of the calves.  He has been treated at [facility] by Dr Jerline Pain with hyperbaric O2 5d per week.  He is getting Na Thiosulfate with HD for suspected calciphylaxis. Pt says doctor's aren't sure if these ulcers were diabetic ulcers or calciphylaxis.  He underwent bilat BKA on 07/30/13.  He says that the woun    Medications:  No current facility-administered medications on file prior to encounter.   Current Outpatient Medications on File Prior to Encounter  Medication Sig Dispense Refill  . acetaminophen (TYLENOL) 500 MG tablet Take 500 mg by mouth every 6 (six) hours as needed for mild pain or moderate pain.    [name] amLODipine (NORVASC) 10 MG tablet Take 10 mg by mouth daily.    [name] aspirin 81 MG chewable tablet Chew 81 mg by mouth daily.    [name] atorvastatin (LIPITOR) 10 MG tablet Take 1  tablet (10 mg total) by mouth daily. (Patient taking differently: Take 10 mg by mouth every Monday, Wednesday, and Friday. ) 30 tablet 1  . calcitRIOL (ROCALTROL) 0.25 MCG capsule Take 0.25 mcg by mouth daily.     . calcium acetate (PHOSLO) 667 MG capsule Take 1,334 mg by mouth 3 (three) times daily with meals.    . CVS D3 50 MCG (2000 UT) CAPS Take 4,000 Units by mouth daily.    [name] ELIQUIS 5 MG TABS tablet TAKE 1 TABLET BY MOUTH TWICE A DAY (Patient taking differently: Take 5 mg by mouth 2 (two) times daily. ) 60 tablet 4  . feeding supplement, GLUCERNA SHAKE, (GLUCERNA SHAKE) LIQD Take 237 mLs by mouth 3 (three) times daily between meals.    [name] HUMALOG KWIKPEN 100 UNIT/ML KwikPen Inject 4-8 Units into the skin daily. Sliding Scale    . hydrALAZINE (APRESOLINE) 100 MG tablet Take 100 mg by mouth 2 (two) times daily. Afternoon, bedtime    . Insulin Glargine (BASAGLAR KWIKPEN) 100 UNIT/ML SOPN Inject 10 Units into the skin daily.     . magnesium chloride (SLOW-MAG) 64 MG TBEC SR tablet  Take 2 tablets by mouth 2 (two) times daily. Take at least 2 hours separate from tacrolimus.    . mycophenolate (MYFORTIC) 180 MG EC tablet Take 360 mg by mouth 2 (two) times daily.     [name] omeprazole (PRILOSEC) 40 MG capsule Take 40 mg by mouth at bedtime.     . predniSONE (DELTASONE) 5 MG tablet Take 5 mg by mouth daily with breakfast.     . sertraline (ZOLOFT) 50 MG tablet Take 50 mg by mouth at bedtime.     . sodium bicarbonate 650 MG tablet Take 1,950 mg by mouth 4 (four) times daily.    . tacrolimus (PROGRAF) 1 MG capsule Take 2-3 mg by mouth See admin instructions. Take 3 mg by mouth in the morning at 9 AM and 2 mg in the evening at 9 PM    . tamsulosin (FLOMAX) 0.4 MG CAPS capsule Take 0.4 mg by mouth daily after supper.        Assessment: 64 y.o. male with h/o Afib, Eliquis on hold, for heparin.  Pt reports last dose of Eliquis last week.  Goal of Therapy:  APTT 66-102 sec Heparin level 0.3-0.7 units/ml Monitor platelets by anticoagulation protocol: Yes   Plan:  After baseline labs obtained, start heparin 1200 units/hr APTT and heparin level in 8 hours  Linlee Cromie, Bronson Curb 01/04/2020,7:44 AM

## 2020-01-04 NOTE — Progress Notes (Signed)
[facility] for Heparin Indication: atrial fibrillation  Allergies  Allergen Reactions  . Metformin And Related Other (See Comments)    Has kidney problems    Patient Measurements: Height: 6' (182.9 cm) Weight: 64.3 kg (141 lb 12.1 oz) IBW/kg (Calculated) : 77.6  Vital Signs: Temp: 99 F (37.2 C) (08/17 1937) Temp Source: Axillary (08/17 1937) BP: 124/65 (08/17 1937) Pulse Rate: 69 (08/17 1937)  Labs: Recent Labs    01/02/20 0353 01/02/20 0353 01/03/20 0305 01/03/20 2306 01/04/20 0727 01/04/20 0751 01/04/20 2107  HGB 8.0*   < > 8.4*  --  7.6*  --   --   HCT 25.5*  --  25.9*  --  24.1*  --   --   PLT 142*  --  167  --  169  --   --   APTT  --   --   --   --   --  32  --   LABPROT  --   --   --   --   --  14.7  --   INR  --   --   --   --   --  1.2  --   HEPARINUNFRC  --   --   --   --   --  <0.10* 0.19*  CREATININE 5.50*   < > 5.35* 5.45* 6.11*  --   --   TROPONINIHS  --   --   --  55* 56*  --   --    < > = values in this interval not displayed.    Estimated Creatinine Clearance: 10.8 mL/min (A) (by C-G formula based on SCr of 6.11 mg/dL (H)).   Medical History: Past Medical History:  Diagnosis Date  . A-fib ([place])   . Anemia    esrd  . Calciphylaxis 05/2013   lower ext  . Cancer ([place])    hx duodenal adenoCa 2002, resected  . Closed left arm fracture 1967  . Corneal abrasion, left   . Depression   . Diabetes mellitus    Type 1 iddm x 11 yrs  . Diabetic neuropathy ([place])   . ESRD on hemodialysis ([place])    Pt has ESRD due to DM.  He had a L upper arm AVF prior to starting HD which was ligated due to L arm steal syndrome.  He started HD in Jan 2014 and did home HD.  The family couldn't cannulate the R arm AVF successfully so by mid 2014 they decided to switch to PD which was done late summer 2014.  In Jan 2015 he was admitted with FTT felt to be due to underdialysis on PD and PD was abandoned and he   . GERD  (gastroesophageal reflux disease)   . Glomerulosclerosis, diabetic ([place])   . Heart murmur   . History of blood transfusion   . Hypertension    off of meds due to orthostatic hypotension  . Kidney transplant recipient   . Open wound of both legs with complication    Pt has had progressive wounds of both LE's including gangrene of the toes and patchy necrosis of the calves.  He has been treated at [facility] by Dr Jerline Pain with hyperbaric O2 5d per week.  He is getting Na Thiosulfate with HD for suspected calciphylaxis. Pt says doctor's aren't sure if these ulcers were diabetic ulcers or calciphylaxis.  He underwent bilat BKA on 07/30/13.  He says that the woun  Medications:  No current facility-administered medications on file prior to encounter.   Current Outpatient Medications on File Prior to Encounter  Medication Sig Dispense Refill  . acetaminophen (TYLENOL) 500 MG tablet Take 500 mg by mouth every 6 (six) hours as needed for mild pain or moderate pain.    [name] amLODipine (NORVASC) 10 MG tablet Take 10 mg by mouth daily.    [name] aspirin 81 MG chewable tablet Chew 81 mg by mouth daily.    [name] atorvastatin (LIPITOR) 10 MG tablet Take 1 tablet (10 mg total) by mouth daily. (Patient taking differently: Take 10 mg by mouth every Monday, Wednesday, and Friday. ) 30 tablet 1  . calcitRIOL (ROCALTROL) 0.25 MCG capsule Take 0.25 mcg by mouth daily.     . calcium acetate (PHOSLO) 667 MG capsule Take 1,334 mg by mouth 3 (three) times daily with meals.    . CVS D3 50 MCG (2000 UT) CAPS Take 4,000 Units by mouth daily.    [name] ELIQUIS 5 MG TABS tablet TAKE 1 TABLET BY MOUTH TWICE A DAY (Patient taking differently: Take 5 mg by mouth 2 (two) times daily. ) 60 tablet 4  . feeding supplement, GLUCERNA SHAKE, (GLUCERNA SHAKE) LIQD Take 237 mLs by mouth 3 (three) times daily between meals.    [name] HUMALOG KWIKPEN 100 UNIT/ML KwikPen Inject 4-8 Units into the skin daily. Sliding Scale    . hydrALAZINE (APRESOLINE) 100  MG tablet Take 100 mg by mouth 2 (two) times daily. Afternoon, bedtime    . Insulin Glargine (BASAGLAR KWIKPEN) 100 UNIT/ML SOPN Inject 10 Units into the skin daily.     . magnesium chloride (SLOW-MAG) 64 MG TBEC SR tablet Take 2 tablets by mouth 2 (two) times daily. Take at least 2 hours separate from tacrolimus.    . mycophenolate (MYFORTIC) 180 MG EC tablet Take 360 mg by mouth 2 (two) times daily.     [name] omeprazole (PRILOSEC) 40 MG capsule Take 40 mg by mouth at bedtime.     . predniSONE (DELTASONE) 5 MG tablet Take 5 mg by mouth daily with breakfast.     . sertraline (ZOLOFT) 50 MG tablet Take 50 mg by mouth at bedtime.     . sodium bicarbonate 650 MG tablet Take 1,950 mg by mouth 4 (four) times daily.    . tacrolimus (PROGRAF) 1 MG capsule Take 2-3 mg by mouth See admin instructions. Take 3 mg by mouth in the morning at 9 AM and 2 mg in the evening at 9 PM    . tamsulosin (FLOMAX) 0.4 MG CAPS capsule Take 0.4 mg by mouth daily after supper.        Assessment: 62 yoM on apixaban PTA for hx AFib transitioned to IV heparin.  Initial heparin level subtherapeutic.  Goal of Therapy:  APTT 66-102 sec Heparin level 0.3-0.7 units/ml Monitor platelets by anticoagulation protocol: Yes   Plan:  Increase heparin to 1400 units/h Recheck heparin level with morning labs   Arrie Senate, PharmD, BCPS Clinical Pharmacist [phone] Please check AMION for all [facility] [place] numbers 01/04/2020

## 2020-01-04 NOTE — Significant Event (Signed)
HOSPITAL MEDICINE OVERNIGHT EVENT NOTE  Notified by nursing patient has gone into rapid atrial fibrillation (known Hx Afib), heart rate in the 120's with SBP's in the 90's.  Chart reviewed, patient had hypokalemia/hypomagnesemia this AM and yesterday.  Patient currently being treated for infection.  Patient suffering from AKI on CKD IV in the setting of Hx of renal transplant.    Will give 250 cc bolus, order repeat electrolytes and troponin.  UPDATE  Nursing reports patient hypotensive with SBP in the 80's to 90's after bolus.  Remains in rapid Afib.  I evaluated the patient at the bedside.  Lethargic but arousable, rapid HR with bibasilar rales on lung exam.  Patient saturating 92% on RA.  Will place on O2 via [place], obtain stat CXR.   Chemistry reveals hyypokalemia of 3.0 with Hypomagnesemia of 1.7.  Will order 58meq K PO, 13meq K IV, 1 gram IV magnesium sulfate.   UPDATE (8/17 1:00AM)  Discussed with overnight Cardiology fellow Dr. Launa Grill who recommends 30 min infusion of 150mg  Amiodarone. No cardioversion indicated at this time.   I have ordered this, have ordered an additional 250 cc bolus of LR and am transferring the patient to stepdown.  Dr. Launa Grill will come and officially consult on the patient, her input is appreciated.    Paul [name]

## 2020-01-04 NOTE — Progress Notes (Signed)
Report called to [facility] on unit 2 central.Patient to transfer to 2 C bed 7.Patient does not want wife notified of transfer at this time.Patient wants staff to notify wife in morning.

## 2020-01-04 NOTE — Progress Notes (Deleted)
PROGRESS NOTE  Paul [name] JKK:[id number] DOB: Nov 05, 1955 DOA: 12/30/2019 PCP: [name], MD   LOS: 4 days   Brief narrative: As per HPI,  Paul [name] is a 64 y.o. male with medical history significant of kidney transplant recipient (2016) with CKD stage IV, hypertension, IDDM, chronic A. fib on Xarelto, B/L BKA secondary to PVD, presented with new onset of right BKA stump pain and rash about 1 week prior to presentation attributed that to a unfit prosthetic which might caused a skin abrasions.  He started to notice significant pain rash associated the end of right leg stump about 3 days ago.  Pain has been constant and gradually getting worse, denied any fever or chills.  Patient has been following with [facility] for the kidney transplant, he was seen in the office about 2 weeks ago and multiple blood work and urine analysis sent, urine protein/Cre>5,000, patient however was not informed about results yet.  Patient claims his kidney function has been stable. In the ED, Creatinine was 5.2 compared to 3.5 at baseline, bicarb less than 7 compared to 12 last week, WBC 13.3, glucose 254.   CT scan of the right lower extremity showed suspicion for early acute osteomyelitis involve the distal tibia, distal fluid collection hematoma versus abscess. Patient was then admitted to the hospital for further evaluation and treatment  Assessment/Plan:  Active Problems:   AKI (acute kidney injury) ([place])   Cellulitis  Sepsis secondary to right leg osteomyelitis and abscess on BKA stump site Present on admission. Sepsis as evidenced by leukocytosis, worsening renal function, CT scan of the extremity showing evidence of early osteomyelitis and possible abscess as source of infection. On vancomycin and cefepime. Pharmacy dosing. Orthopedic surgery, has seen the patient and recommend MRI of the affected extremity.  CRP mildly elevated.  Patient will likely need incision and drainage. Continue to  hold Eliquis at this time.  Will transition to heparin drip at this time.  Leukocytosis has trended down.  Atrial fibrillation with rapid ventricular response on the background ofChronic A. Fib Rapid response was done on the patient yesterday due to tachycardia and hypotension.  Was seen by cardiology.  Patient is already on amiodarone IV drip.  Cardiology to follow the patient.  Blood pressure has improved at this time.  AKI on CKD stage IV. History of renal transplant.  Severe metabolic acidosis. Nephrology on board. Received bicarb drip for severe anion gap metabolic acidosis. History of renal transplant. Continue tacrolimus, prednisone and mycophenolate. Continue to monitor creatinine levels.  Trending up creatinine levels.  Patient might need renal replacement therapy during this admission.  Lab Results  Component Value Date   CREATININE 6.11 (H) 01/04/2020   CREATININE 5.45 (H) 01/03/2020   CREATININE 5.35 (H) 01/03/2020    Hypokalemia.  Improved.  Replenished orally yesterday.  Check BMP in a.m.  Diabetes mellitus type II with hyperglycemia  Hemoglobin A1c of 10.3.  On low-dose prednisone.  Glucose level of 240 in the BMP.  Mild hypomagnesemia improved with replacement.  Magnesium 2.3 today.   History of pancreatic insufficiency. History of duodenal adenocarcinoma status post resection. Added Creon on 01/01/2020  Essential hypertension -Hold clonidine, continue hydralazine with parameters.   Hyperlipidemia Continue statin   DVT prophylaxis: Heparin drip  Code Status: Full code  Family Communication: None  Status is: Inpatient  Remains inpatient appropriate because:Unsafe d/c plan, IV treatments appropriate due to intensity of illness or inability to take PO and Inpatient level of care  appropriate due to severity of illness, possible need for surgical intervention, MRI of the stump pending   Dispo: The patient is from: Home              Anticipated d/c is to: Home  with home PT              Anticipated d/c date is: 3 days              Patient currently is not medically stable to d/c.  Consultants:  Orthopedics  Nephrology  Procedures:  None yet  Antibiotics:  . Vancomycin and cefepime IV 8/13>  Anti-infectives (From admission, onward)   Start     Dose/Rate Route Frequency Ordered Stop   12/31/19 1400  vancomycin (VANCOCIN) IVPB 1000 mg/200 mL premix     Discontinue     1,000 mg 200 mL/hr over 60 Minutes Intravenous Every 48 hours 12/31/19 1347     12/31/19 1345  ceFEPIme (MAXIPIME) 2 g in sodium chloride 0.9 % 100 mL IVPB     Discontinue     2 g 200 mL/hr over 30 Minutes Intravenous Every 24 hours 12/31/19 1347     12/31/19 1330  vancomycin (VANCOCIN) IVPB 1000 mg/200 mL premix  Status:  Discontinued        1,000 mg 200 mL/hr over 60 Minutes Intravenous  Once 12/31/19 1315 12/31/19 1347      Subjective: Today, patient was seen and examined at bedside.  Had replacements done yesterday evening due to hypotension and RVR.  Denies any chest pain, dyspnea, shortness of breath.  No fever chills or rigor  Objective: Vitals:   01/04/20 1030 01/04/20 1100  BP: 107/61 108/62  Pulse: 77 76  Resp: 15 13  Temp:    SpO2: 100% 100%    Intake/Output Summary (Last 24 hours) at 01/04/2020 1120 Last data filed at 01/04/2020 0039 Gross per 24 hour  Intake 666.31 ml  Output 100 ml  Net 566.31 ml   Filed Weights   12/31/19 1119 01/04/20 0920  Weight: 80.7 kg 65.3 kg   Body mass index is 19.52 kg/m.   Physical Exam: General: Patient is alert awake and communicative, not in obvious distress, thinly built HENT:   Mild pallor noted oral mucosa is moist.  Chest:  Clear breath sounds.  Diminished breath sounds bilaterally. No crackles or wheezes.  CVS: S1 &S2 heard. No murmur.  Regular rate and rhythm. Abdomen: Soft, nontender, nondistended.  Bowel sounds are heard.   Extremities: Right below-knee amputation stump with erythematous rash and  blister slightly improved erythema.  Tenderness on palpation.  Left below-knee amputation stump healthy Psych: Alert, awake and communicative,  CNS:  No cranial nerve deficits.  Moving extremities Skin: Warm and dry.  Right below-knee amputation stump with erythema and blister.  Data Review: I have personally reviewed the following laboratory data and studies,  CBC: Recent Labs  Lab 12/30/19 1819 01/02/20 0353 01/03/20 0305 01/04/20 0727  WBC 13.3* 11.4* 9.6 8.6  NEUTROABS 10.1*  --   --   --   HGB 10.4* 8.0* 8.4* 7.6*  HCT 34.2* 25.5* 25.9* 24.1*  MCV 92.4 89.5 88.7 90.9  PLT 161 142* 167 676   Basic Metabolic Panel: Recent Labs  Lab 01/01/20 1100 01/02/20 0353 01/03/20 0305 01/03/20 2306 01/04/20 0727  NA 143 143 144 146* 144  K 2.6* 3.6 3.1* 3.0* 4.1  CL 116* 116* 106 115* 111  CO2 10* 8* 22 13* 16*  GLUCOSE 186* 260*  204* 194* 240*  BUN 93* 97* 89* 92* 102*  CREATININE 5.29* 5.50* 5.35* 5.45* 6.11*  CALCIUM 7.7* 7.9* 7.3* 7.2* 7.7*  MG  --  1.6* 1.6* 1.7 2.3  PHOS  --  9.0* 8.5* 8.0*  --    Liver Function Tests: Recent Labs  Lab 12/30/19 1819 12/30/19 1819 01/01/20 1100 01/02/20 0353 01/03/20 0305 01/03/20 2306 01/04/20 0727  AST 10*  --  16 14* 11*  --  9*  ALT 11  --  13 14 12   --  13  ALKPHOS 59  --  50 52 59  --  56  BILITOT 0.7  --  0.9 1.0 0.9  --  0.7  PROT 5.7*  --  4.6* 4.8* 4.7*  --  4.6*  ALBUMIN 2.6*   < > 2.0* 2.1* 2.0* 1.8* 1.8*   < > = values in this interval not displayed.   No results for input(s): LIPASE, AMYLASE in the last 168 hours. No results for input(s): AMMONIA in the last 168 hours. Cardiac Enzymes: Recent Labs  Lab 01/01/20 1100  CKTOTAL 56   BNP (last 3 results) Recent Labs    10/12/19 0011  BNP 1,980.9*    ProBNP (last 3 results) No results for input(s): PROBNP in the last 8760 hours.  CBG: Recent Labs  Lab 01/03/20 0633 01/03/20 1100 01/03/20 1616 01/03/20 2118 01/04/20 0836  GLUCAP 217* 195* 154*  167* 203*   Recent Results (from the past 240 hour(s))  SARS Coronavirus 2 by RT PCR (hospital order, performed in [facility] hospital lab) Nasopharyngeal Nasopharyngeal Swab     Status: None   Collection Time: 12/31/19 11:15 AM   Specimen: Nasopharyngeal Swab  Result Value Ref Range Status   SARS Coronavirus 2 NEGATIVE NEGATIVE Final    Comment: (NOTE) SARS-CoV-2 target nucleic acids are NOT DETECTED.  The SARS-CoV-2 RNA is generally detectable in upper and lower respiratory specimens during the acute phase of infection. The lowest concentration of SARS-CoV-2 viral copies this assay can detect is 250 copies / mL. A negative result does not preclude SARS-CoV-2 infection and should not be used as the sole basis for treatment or other patient management decisions.  A negative result may occur with improper specimen collection / handling, submission of specimen other than nasopharyngeal swab, presence of viral mutation(s) within the areas targeted by this assay, and inadequate number of viral copies (<250 copies / mL). A negative result must be combined with clinical observations, patient history, and epidemiological information.  Fact Sheet for Patients:   StrictlyIdeas.no  Fact Sheet for Healthcare Providers: BankingDealers.co.za  This test is not yet approved or  cleared by the [place] FDA and has been authorized for detection and/or diagnosis of SARS-CoV-2 by FDA under an Emergency Use Authorization (EUA).  This EUA will remain in effect (meaning this test can be used) for the duration of the COVID-19 declaration under Section 564(b)(1) of the Act, 21 U.S.C. section 360bbb-3(b)(1), unless the authorization is terminated or revoked sooner.  Performed at [facility], [place] [address] [place], [place] [address]      Studies: DG Chest 1 View  Result Date: 01/04/2020 CLINICAL DATA:  Pulmonary edema EXAM: CHEST  1 VIEW  COMPARISON:  10/12/2019 FINDINGS: The heart size and mediastinal contours are within normal limits. Both lungs are clear. The visualized skeletal structures are unremarkable. IMPRESSION: No active disease. Electronically Signed   By: Ulyses Jarred M.D.   On: 01/04/2020 01:21  Flora Lipps, MD  [facility] 01/04/2020

## 2020-01-04 NOTE — Progress Notes (Signed)
Inpatient Diabetes Program Recommendations  AACE/ADA: New Consensus Statement on Inpatient Glycemic Control   Target Ranges:  Prepandial:   less than 140 mg/dL      Peak postprandial:   less than 180 mg/dL (1-2 hours)      Critically ill patients:  140 - 180 mg/dL   Results for HUMPHREY, GUERREIRO (MRN [id number]) as of 01/04/2020 09:02  Ref. Range 01/03/2020 06:33 01/03/2020 11:00 01/03/2020 16:16 01/03/2020 21:18 01/04/2020 08:36  Glucose-Capillary Latest Ref Range: 70 - 99 mg/dL 217 (H) 195 (H) 154 (H) 167 (H) 203 (H)   Review of Glycemic Control  Diabetes history: DM2 Outpatient Diabetes medications: Basaglar 10 units daily, Humalog 4-8 units TID with meals Current orders for Inpatient glycemic control: Novolog 0-9 units TID with meals; Prednisone 5 mg daily  Inpatient Diabetes Program Recommendations:    Insulin-Please consider ordering Lantus 5 units Q24H.  Thanks, Barnie Alderman, RN, MSN, CDE Diabetes Coordinator Inpatient Diabetes Program [phone] (Team Pager from 8am to 5pm)

## 2020-01-04 NOTE — Progress Notes (Signed)
   01/03/20 2350  Assess: MEWS Score  Temp 97.6 F (36.4 C)  BP (!) 89/52  Pulse Rate (!) 132  ECG Heart Rate (!) 132  Resp 18  Level of Consciousness Alert  SpO2 98 %  O2 Device Nasal Cannula  O2 Flow Rate (L/min) 2 L/min  Assess: MEWS Score  MEWS Temp 0  MEWS Systolic 1  MEWS Pulse 3  MEWS RR 0  MEWS LOC 0  MEWS Score 4  MEWS Score Color Red  Assess: if the MEWS score is Yellow or Red  Were vital signs taken at a resting state? Yes  Focused Assessment No change from prior assessment  Early Detection of Sepsis Score *See Row Information* Medium  MEWS guidelines implemented *See Row Information* Yes  Treat  MEWS Interventions Escalated (See documentation below)  Pain Scale 0-10  Pain Score 0  Take Vital Signs  Increase Vital Sign Frequency  Red: Q 1hr X 4 then Q 4hr X 4, if remains red, continue Q 4hrs  Escalate  MEWS: Escalate Red: discuss with charge nurse/RN and provider, consider discussing with RRT  Notify: Charge Nurse/RN  Name of Charge Nurse/RN Notified Charito RN  Date Charge Nurse/RN Notified 01/04/20  Time Charge Nurse/RN Notified 2355  Notify: Provider  Provider Name/Title Inda Merlin MD  Date Provider Notified 01/04/20  Time Provider Notified 0005  Notification Type Page  Notification Reason Other (Comment)  Response See new orders  Date of Provider Response 01/04/20  Time of Provider Response 0008 (at bedside to evaluate patient)  Notify: Rapid Response  Name of Rapid Response RN Notified Mindy RN  Date Rapid Response Notified 01/04/20  Time Rapid Response Notified 0001  Document  Patient Outcome Transferred/level of care increased

## 2020-01-04 NOTE — Progress Notes (Addendum)
PROGRESS NOTE  Paul [name]:[id number] DOB: November 19, 1955 DOA: 12/30/2019 PCP: [name], MD   LOS: 4 days   Brief narrative: As per HPI,  THATCHER DOBERSTEIN is a 64 y.o. male with medical history significant of kidney transplant recipient (2016) with CKD stage IV, hypertension, IDDM, chronic A. fib on Xarelto, B/L BKA secondary to PVD, presented with new onset of right BKA stump pain and rash about 1 week prior to presentation attributed that to a unfit prosthetic which might caused a skin abrasions.  He started to notice significant pain rash associated the end of right leg stump about 3 days ago.  Pain has been constant and gradually getting worse, denied any fever or chills.  Patient has been following with [facility] for the kidney transplant, he was seen in the office about 2 weeks ago and multiple blood work and urine analysis sent, urine protein/Cre>5,000, patient however was not informed about results yet.  Patient claims his kidney function has been stable. In the ED, Creatinine was 5.2 compared to 3.5 at baseline, bicarb less than 7 compared to 12 last week, WBC 13.3, glucose 254.   CT scan of the right lower extremity showed suspicion for early acute osteomyelitis involve the distal tibia, distal fluid collection hematoma versus abscess. Patient was then admitted to the hospital for further evaluation and treatment.  Assessment/Plan:  Principal Problem:   Sepsis ([place]) Active Problems:   ESRD on hemodialysis ([place])   Anemia   DM (diabetes mellitus) ([place])   S/P BKA (below knee amputation) bilateral ([facility])   AF (paroxysmal atrial fibrillation) ([facility])   AKI (acute kidney injury) ([place])   CKD stage 4 due to type 2 diabetes mellitus ([place])   Cellulitis  Sepsis secondary to right leg osteomyelitis and abscess on BKA stump site Present on admission. Sepsis as evidenced by leukocytosis, worsening renal function, CT scan of the extremity showing evidence of early osteomyelitis  and possible abscess as source of infection. On vancomycin and cefepime. Pharmacy dosing. Orthopedic surgery, has seen the patient and recommend MRI of the affected extremity.  CRP mildly elevated.  Patient will likely need incision and drainage. Continue to hold Eliquis at this time.  Will transition to heparin drip at this time.  Leukocytosis has trended down.  Atrial fibrillation with rapid ventricular response on the background ofChronic A. Fib Rapid response was done on the patient yesterday due to tachycardia and hypotension.  Was seen by cardiology.  Patient is already on amiodarone IV drip.  Cardiology to follow the patient.  Blood pressure has improved at this time.  AKI on CKD stage IV. History of renal transplant.  Severe metabolic acidosis. Nephrology on board. Received bicarb drip for severe anion gap metabolic acidosis. History of renal transplant. Continue tacrolimus, prednisone and mycophenolate. Continue to monitor creatinine levels.  Trending up creatinine levels.  Patient might need renal replacement therapy during this admission.  Lab Results  Component Value Date   CREATININE 6.11 (H) 01/04/2020   CREATININE 5.45 (H) 01/03/2020   CREATININE 5.35 (H) 01/03/2020    Hypokalemia.  Improved.  Replenished orally yesterday.  Check BMP in a.m.  Diabetes mellitus type II with hyperglycemia  Hemoglobin A1c of 10.3.  On low-dose prednisone.  Glucose level of 240 in the BMP.  Mild hypomagnesemia improved with replacement.  Magnesium 2.3 today.   History of pancreatic insufficiency. History of duodenal adenocarcinoma status post resection. Added Creon on 01/01/2020  Essential hypertension -Hold clonidine, continue hydralazine with parameters.  Hyperlipidemia Continue statin   DVT prophylaxis: Heparin drip  Code Status: Full code  Family Communication: Spoke with the patient's wife about the clinical condition of the patient of the patient.   Status is:  Inpatient  Remains inpatient appropriate because:Unsafe d/c plan, IV treatments appropriate due to intensity of illness or inability to take PO and Inpatient level of care appropriate due to severity of illness, possible need for surgical intervention, MRI of the stump pending   Dispo: The patient is from: Home              Anticipated d/c is to: Home with home PT              Anticipated d/c date is: 3 days              Patient currently is not medically stable to d/c.  Consultants:  Orthopedics  Nephrology  Procedures:  None yet  Antibiotics:  . Vancomycin and cefepime IV 8/13>  Anti-infectives (From admission, onward)   Start     Dose/Rate Route Frequency Ordered Stop   01/04/20 1130  vancomycin (VANCOCIN) 1-5 GM/200ML-% IVPB       Note to Pharmacy: Murriel Hopper   : cabinet override      01/04/20 1130 01/04/20 2344   12/31/19 1400  vancomycin (VANCOCIN) IVPB 1000 mg/200 mL premix     Discontinue     1,000 mg 200 mL/hr over 60 Minutes Intravenous Every 48 hours 12/31/19 1347     12/31/19 1345  ceFEPIme (MAXIPIME) 2 g in sodium chloride 0.9 % 100 mL IVPB     Discontinue     2 g 200 mL/hr over 30 Minutes Intravenous Every 24 hours 12/31/19 1347     12/31/19 1330  vancomycin (VANCOCIN) IVPB 1000 mg/200 mL premix  Status:  Discontinued        1,000 mg 200 mL/hr over 60 Minutes Intravenous  Once 12/31/19 1315 12/31/19 1347     Subjective:  Today, patient was seen and examined at bedside.  Had replacements done yesterday evening due to hypotension and RVR.  Denies any chest pain, dyspnea, shortness of breath.  No fever chills or rigor.  Objective: Vitals:   01/04/20 1030 01/04/20 1100  BP: 107/61 108/62  Pulse: 77 76  Resp: 15 13  Temp:    SpO2: 100% 100%    Intake/Output Summary (Last 24 hours) at 01/04/2020 1133 Last data filed at 01/04/2020 0039 Gross per 24 hour  Intake 666.31 ml  Output 100 ml  Net 566.31 ml   Filed Weights   12/31/19 1119 01/04/20 0920   Weight: 80.7 kg 65.3 kg   Body mass index is 19.52 kg/m.   Physical Exam:  General: Patient is mildly somnolent but communicative, not in obvious distress, thinly built HENT:   Mild pallor noted, oral mucosa is moist.  Chest:  Clear breath sounds.  Diminished breath sounds bilaterally. No crackles or wheezes.  CVS: S1 &S2 heard. No murmur.  Regular rate and rhythm. Abdomen: Soft, nontender, nondistended.  Bowel sounds are heard.   Extremities: Right below-knee amputation stump with erythematous rash and blister slightly improved erythema.  Tenderness on palpation.  Left below-knee amputation stump healthy Psych: somnolent but communicative.,  CNS:  No cranial nerve deficits.  Moving extremities Skin: Warm and dry.  Right below-knee amputation stump with erythema and blister.  Data Review: I have personally reviewed the following laboratory data and studies,  CBC: Recent Labs  Lab 12/30/19 1819 01/02/20 0353  01/03/20 0305 01/04/20 0727  WBC 13.3* 11.4* 9.6 8.6  NEUTROABS 10.1*  --   --   --   HGB 10.4* 8.0* 8.4* 7.6*  HCT 34.2* 25.5* 25.9* 24.1*  MCV 92.4 89.5 88.7 90.9  PLT 161 142* 167 885   Basic Metabolic Panel: Recent Labs  Lab 01/01/20 1100 01/02/20 0353 01/03/20 0305 01/03/20 2306 01/04/20 0727  NA 143 143 144 146* 144  K 2.6* 3.6 3.1* 3.0* 4.1  CL 116* 116* 106 115* 111  CO2 10* 8* 22 13* 16*  GLUCOSE 186* 260* 204* 194* 240*  BUN 93* 97* 89* 92* 102*  CREATININE 5.29* 5.50* 5.35* 5.45* 6.11*  CALCIUM 7.7* 7.9* 7.3* 7.2* 7.7*  MG  --  1.6* 1.6* 1.7 2.3  PHOS  --  9.0* 8.5* 8.0*  --    Liver Function Tests: Recent Labs  Lab 12/30/19 1819 12/30/19 1819 01/01/20 1100 01/02/20 0353 01/03/20 0305 01/03/20 2306 01/04/20 0727  AST 10*  --  16 14* 11*  --  9*  ALT 11  --  13 14 12   --  13  ALKPHOS 59  --  50 52 59  --  56  BILITOT 0.7  --  0.9 1.0 0.9  --  0.7  PROT 5.7*  --  4.6* 4.8* 4.7*  --  4.6*  ALBUMIN 2.6*   < > 2.0* 2.1* 2.0* 1.8* 1.8*   <  > = values in this interval not displayed.   No results for input(s): LIPASE, AMYLASE in the last 168 hours. No results for input(s): AMMONIA in the last 168 hours. Cardiac Enzymes: Recent Labs  Lab 01/01/20 1100  CKTOTAL 56   BNP (last 3 results) Recent Labs    10/12/19 0011  BNP 1,980.9*    ProBNP (last 3 results) No results for input(s): PROBNP in the last 8760 hours.  CBG: Recent Labs  Lab 01/03/20 0633 01/03/20 1100 01/03/20 1616 01/03/20 2118 01/04/20 0836  GLUCAP 217* 195* 154* 167* 203*   Recent Results (from the past 240 hour(s))  SARS Coronavirus 2 by RT PCR (hospital order, performed in [facility] hospital lab) Nasopharyngeal Nasopharyngeal Swab     Status: None   Collection Time: 12/31/19 11:15 AM   Specimen: Nasopharyngeal Swab  Result Value Ref Range Status   SARS Coronavirus 2 NEGATIVE NEGATIVE Final    Comment: (NOTE) SARS-CoV-2 target nucleic acids are NOT DETECTED.  The SARS-CoV-2 RNA is generally detectable in upper and lower respiratory specimens during the acute phase of infection. The lowest concentration of SARS-CoV-2 viral copies this assay can detect is 250 copies / mL. A negative result does not preclude SARS-CoV-2 infection and should not be used as the sole basis for treatment or other patient management decisions.  A negative result may occur with improper specimen collection / handling, submission of specimen other than nasopharyngeal swab, presence of viral mutation(s) within the areas targeted by this assay, and inadequate number of viral copies (<250 copies / mL). A negative result must be combined with clinical observations, patient history, and epidemiological information.  Fact Sheet for Patients:   StrictlyIdeas.no  Fact Sheet for Healthcare Providers: BankingDealers.co.za  This test is not yet approved or  cleared by the [place] FDA and has been authorized for detection  and/or diagnosis of SARS-CoV-2 by FDA under an Emergency Use Authorization (EUA).  This EUA will remain in effect (meaning this test can be used) for the duration of the COVID-19 declaration under Section 564(b)(1) of  the Act, 21 U.S.C. section 360bbb-3(b)(1), unless the authorization is terminated or revoked sooner.  Performed at [facility], [place] [address] [place], [place] [address]      Studies: DG Chest 1 View  Result Date: 01/04/2020 CLINICAL DATA:  Pulmonary edema EXAM: CHEST  1 VIEW COMPARISON:  10/12/2019 FINDINGS: The heart size and mediastinal contours are within normal limits. Both lungs are clear. The visualized skeletal structures are unremarkable. IMPRESSION: No active disease. Electronically Signed   By: Ulyses Jarred M.D.   On: 01/04/2020 01:21     Flora Lipps, MD  [facility] 01/04/2020

## 2020-01-04 NOTE — Consult Note (Signed)
Cardiology Consultation:   Patient ID: [name] MRN: [id number]; DOB: May 21, 1955  Admit date: 12/30/2019 Date of Consult: 01/04/2020  Primary Care Provider: [name], MD Primary Cardiologist: Evalina Field, MD  Primary Electrophysiologist:  None    Patient Profile:   Paul [name] is a 64 y.o. male with a hx of renal transplant 2016, PAD s/p bilateral BKA, chronic AF, IDDM, hypertension, and CKD IV who was admitted 8/12 for right leg osteomyelitis and AKI who is being seen today for the evaluation of atrial fibrillation at the request of Dr. Marlyce Huge.  History of Present Illness:   [name] has a history of chronic paroxysmal AF with no prior cardioversion or ablation.  He is prescribed apixaban 5 mg BID at home.   He was admitted with sepsis due to right leg osteomyelitis with possible abscess. His team is considering I&D. He has had creatinine levels elevated from baseline and his team is considering need for dialysis.   This evening, he developed AF with RVR to 120s-130s. Blood pressure was 90s/60s, which is decreased from previously. He received 250 mL IV fluid as well as potassium and magnesium supplementation. He is currently off apixaban due to anticipated surgery.   On my interview, Mr. [name] is sleepy but arousable. He has no chest pain, dyspnea, nausea, or new pain.   Heart Pathway Score:     Past Medical History:  Diagnosis Date  . A-fib ([place])   . Anemia    esrd  . Calciphylaxis 05/2013   lower ext  . Cancer ([place])    hx duodenal adenoCa 2002, resected  . Closed left arm fracture 1967  . Corneal abrasion, left   . Depression   . Diabetes mellitus    Type 1 iddm x 11 yrs  . Diabetic neuropathy ([place])   . ESRD on hemodialysis ([place])    Pt has ESRD due to DM.  He had a L upper arm AVF prior to starting HD which was ligated due to L arm steal syndrome.  He started HD in Jan 2014 and did home HD.  The family couldn't cannulate the R arm AVF successfully so by  mid 2014 they decided to switch to PD which was done late summer 2014.  In Jan 2015 he was admitted with FTT felt to be due to underdialysis on PD and PD was abandoned and he   . GERD (gastroesophageal reflux disease)   . Glomerulosclerosis, diabetic ([place])   . Heart murmur   . History of blood transfusion   . Hypertension    off of meds due to orthostatic hypotension  . Kidney transplant recipient   . Open wound of both legs with complication    Pt has had progressive wounds of both LE's including gangrene of the toes and patchy necrosis of the calves.  He has been treated at [facility] by Dr Jerline Pain with hyperbaric O2 5d per week.  He is getting Na Thiosulfate with HD for suspected calciphylaxis. Pt says doctor's aren't sure if these ulcers were diabetic ulcers or calciphylaxis.  He underwent bilat BKA on 07/30/13.  He says that the woun    Past Surgical History:  Procedure Laterality Date  . AMPUTATION Bilateral 07/30/2013   Procedure: AMPUTATION BELOW KNEE;  Surgeon: Newt Minion, MD;  Location: [facility];  Service: Orthopedics;  Laterality: Bilateral;  Bilateral Below Knee Amputations  . AMPUTATION Bilateral 09/03/2013   Procedure: AMPUTATION BELOW KNEE;  Surgeon: Newt Minion, MD;  Location: [facility];  Service: Orthopedics;  Laterality: Bilateral;  Bilateral Below Knee Amputation Revision  . AV FISTULA PLACEMENT  06/07/2011   Procedure: ARTERIOVENOUS (AV) FISTULA CREATION;  Surgeon: Angelia Mould, MD;  Location: [facility];  Service: Vascular;  Laterality: Left;  . AV FISTULA PLACEMENT  06/18/2012   Procedure: ARTERIOVENOUS (AV) FISTULA CREATION;  Surgeon: Mal Misty, MD;  Location: [facility];  Service: Vascular;  Laterality: Right;  [place]  . BILIARY DIVERSION, EXTERNAL  2002  . BONE MARROW BIOPSY  2012  . CAPD INSERTION N/A 01/14/2013   Procedure: LAPAROSCOPIC INSERTION CONTINUOUS AMBULATORY PERITONEAL DIALYSIS  (CAPD) CATHETER;  Surgeon: Adin H[place], MD;  Location: [facility];  Service:  General;  Laterality: N/A;  . CAPD REMOVAL N/A 07/09/2013   Procedure: CONTINUOUS AMBULATORY PERITONEAL DIALYSIS  (CAPD) CATHETER REMOVAL;  Surgeon: Adin H[place], MD;  Location: [facility];  Service: General;  Laterality: N/A;  . COLONOSCOPY     Hx: of  . INSERTION OF DIALYSIS CATHETER  05/22/2012   Procedure: INSERTION OF DIALYSIS CATHETER;  Surgeon: Mal Misty, MD;  Location: [place];  Service: Vascular;  Laterality: N/A;  right internal jugular vein  . IR ANGIO INTRA EXTRACRAN SEL COM CAROTID INNOMINATE BILAT MOD SED  10/28/2018  . IR ANGIO VERTEBRAL SEL SUBCLAVIAN INNOMINATE UNI R MOD SED  10/28/2018  . IR ANGIO VERTEBRAL SEL VERTEBRAL UNI L MOD SED  10/28/2018  . LIGATION OF ARTERIOVENOUS  FISTULA  05/22/2012   Procedure: LIGATION OF ARTERIOVENOUS  FISTULA;  Surgeon: Mal Misty, MD;  Location: [facility];  Service: Vascular;  Laterality: Left;  Left brachio-cephalic fistula  . LOWER EXTREMITY ANGIOGRAM N/A 06/14/2013   Procedure: LOWER EXTREMITY ANGIOGRAM;  Surgeon: Angelia Mould, MD;  Location: [facility] CATH LAB;  Service: Cardiovascular;  Laterality: N/A;  . NEPHRECTOMY TRANSPLANTED ORGAN    . OTHER SURGICAL HISTORY     Cyst removed from back  . PORTACATH PLACEMENT  2002  . RADIOLOGY WITH ANESTHESIA N/A 10/28/2018   Procedure: IR WITH ANESTHESIA;  Surgeon: Radiologist, Medication, MD;  Location: [facility];  Service: Radiology;  Laterality: N/A;  . REMOVAL OF A DIALYSIS CATHETER    . RENAL BIOPSY, PERCUTANEOUS  2012  . TONSILLECTOMY    . VASCULAR SURGERY    . WHIPPLE PROCEDURE  2002   duodenal ca, Dr. Harlow Asa     Home Medications:  Prior to Admission medications   Medication Sig Start Date End Date Taking? Authorizing Provider  acetaminophen (TYLENOL) 500 MG tablet Take 500 mg by mouth every 6 (six) hours as needed for mild pain or moderate pain.   Yes [provider]  amLODipine (NORVASC) 10 MG tablet Take 10 mg by mouth daily.   Yes [provider]  aspirin 81 MG chewable  tablet Chew 81 mg by mouth daily.   Yes [provider]  atorvastatin (LIPITOR) 10 MG tablet Take 1 tablet (10 mg total) by mouth daily. Patient taking differently: Take 10 mg by mouth every Monday, Wednesday, and Friday.  08/13/13  Yes Angiulli, Lavon Paganini, PA-C  calcitRIOL (ROCALTROL) 0.25 MCG capsule Take 0.25 mcg by mouth daily.    Yes [provider]  calcium acetate (PHOSLO) 667 MG capsule Take 1,334 mg by mouth 3 (three) times daily with meals.   Yes [provider]  CVS D3 50 MCG (2000 UT) CAPS Take 4,000 Units by mouth daily. 09/18/18  Yes [provider]  ELIQUIS 5 MG TABS tablet TAKE  1 TABLET BY MOUTH TWICE A DAY Patient taking differently: Take 5 mg by mouth 2 (two) times daily.  07/13/19  Yes O'Neal, Cassie Freer, MD  feeding supplement, GLUCERNA SHAKE, (GLUCERNA SHAKE) LIQD Take 237 mLs by mouth 3 (three) times daily between meals.   Yes [provider]  HUMALOG KWIKPEN 100 UNIT/ML KwikPen Inject 4-8 Units into the skin daily. Sliding Scale 01/11/19  Yes [provider]  hydrALAZINE (APRESOLINE) 100 MG tablet Take 100 mg by mouth 2 (two) times daily. Afternoon, bedtime 08/19/19  Yes [provider]  Insulin Glargine (BASAGLAR KWIKPEN) 100 UNIT/ML SOPN Inject 10 Units into the skin daily.  01/11/19  Yes [provider]  magnesium chloride (SLOW-MAG) 64 MG TBEC SR tablet Take 2 tablets by mouth 2 (two) times daily. Take at least 2 hours separate from tacrolimus. 10/02/14  Yes [provider]  mycophenolate (MYFORTIC) 180 MG EC tablet Take 360 mg by mouth 2 (two) times daily.    Yes [provider]  omeprazole (PRILOSEC) 40 MG capsule Take 40 mg by mouth at bedtime.  01/11/14  Yes [provider]  predniSONE (DELTASONE) 5 MG tablet Take 5 mg by mouth daily with breakfast.    Yes [provider]  sertraline (ZOLOFT) 50 MG tablet Take 50 mg by mouth at bedtime.  08/15/19  Yes [provider]  sodium bicarbonate 650 MG tablet Take 1,950 mg by mouth 4 (four) times daily. 09/21/18  Yes [provider]  tacrolimus (PROGRAF) 1 MG capsule Take 2-3 mg by mouth See admin instructions. Take 3 mg by mouth in the morning at 9 AM and 2 mg in the evening at 9 PM   Yes [provider]  tamsulosin (FLOMAX) 0.4 MG CAPS capsule Take 0.4 mg by mouth daily after supper.    Yes [provider]    Inpatient Medications: Scheduled Meds: . aspirin EC  81 mg Oral Daily  . atorvastatin  10 mg Oral Q M,W,F  . calcium acetate  2,001 mg Oral TID WC  . Chlorhexidine Gluconate Cloth  6 each Topical Q0600  . cholecalciferol  4,000 Units Oral Daily  . heparin  5,000 Units Subcutaneous Q12H  . hydrALAZINE  50 mg Oral Q6H  . insulin aspart  0-9 Units Subcutaneous TID WC  . lipase/protease/amylase  12,000 Units Oral TID AC  . magnesium chloride  2 tablet Oral BID  . mycophenolate  360 mg Oral BID  . pantoprazole  40 mg Oral Daily  . predniSONE  5 mg Oral Q breakfast  . sertraline  50 mg Oral Daily  . sodium bicarbonate  1,300 mg Oral TID  . tacrolimus  3 mg Oral Daily   And  . tacrolimus  2 mg Oral Daily  . tamsulosin  0.4 mg Oral QPC supper   Continuous Infusions: . amiodarone    . ceFEPime (MAXIPIME) IV 2 g (01/03/20 2217)  . magnesium sulfate bolus IVPB 1 g (01/04/20 0055)  . potassium chloride 10 mEq (01/04/20 0039)  . vancomycin 1,000 mg (01/02/20 1356)   PRN Meds: acetaminophen **OR** acetaminophen, hydrALAZINE, HYDROmorphone (DILAUDID) injection, ondansetron (ZOFRAN) IV  Allergies:    Allergies  Allergen Reactions  . Metformin And Related Other (See Comments)    Has kidney problems    Social History:   Social History   Socioeconomic History  . Marital status: Married    Spouse name: Not on file  . Number of children: Not on file  . Years  of education: Not on file  . Highest education level: Not on file  Occupational History  . Not on file  Tobacco  Use  . Smoking status: Never Smoker  . Smokeless tobacco: Never Used  Vaping Use  . Vaping Use: Never used  Substance and Sexual Activity  . Alcohol use: No  . Drug use: No  . Sexual activity: Not on file  Other Topics Concern  . Not on file  Social History Narrative  . Not on file   Social Determinants of Health   Financial Resource Strain:   . Difficulty of Paying Living Expenses:   Food Insecurity:   . Worried About Charity fundraiser in the Last Year:   . Arboriculturist in the Last Year:   Transportation Needs:   . Film/video editor (Medical):   [name] Lack of Transportation (Non-Medical):   Physical Activity:   . Days of Exercise per Week:   . Minutes of Exercise per Session:   Stress:   . Feeling of Stress :   Social Connections:   . Frequency of Communication with Friends and Family:   . Frequency of Social Gatherings with Friends and Family:   . Attends Religious Services:   . Active Member of Clubs or Organizations:   . Attends Archivist Meetings:   [name] Marital Status:   Intimate Partner Violence:   . Fear of Current or Ex-Partner:   . Emotionally Abused:   [name] Physically Abused:   . Sexually Abused:     Family History:   Family History  Problem Relation Age of Onset  . Cancer Mother   . Cancer Father        prostate cancer  . Heart disease Maternal Grandfather   . Stroke Paternal Grandmother     Physical Exam/Data:   Vitals:   01/03/20 2157 01/03/20 2350 01/04/20 0041 01/04/20 0101  BP: (!) 93/46 (!) 89/52 (!) 97/49 105/62  Pulse: (!) 104 (!) 132 (!) 132 (!) 129  Resp: _0 Temp: 98.2 F (36.8 C) 97.6 F (36.4 C) 98.2 F (36.8 C)   TempSrc: Oral Oral Oral   SpO2: 92% 98% 97% 99%  Weight:      Height:        Intake/Output Summary (Last 24 hours) at 01/04/2020 0143 Last data filed at 01/04/2020 0039 Gross per 24 hour  Intake 786.31 ml  Output 550 ml  Net 236.31 ml   Last 3 Weights 12/31/2019 04/23/2019 04/20/2019  Weight  (lbs) 178 lb 183 lb 183 lb  Weight (kg) 80.74 kg 83.008 kg 83.008 kg     Body mass index is 24.14 kg/m.  General:  Well nourished, well developed, in no acute distress, lethargic  HEENT: normal Neck: no JVD Endocrine:  No thryomegaly Vascular: No carotid bruits; FA pulses 1+ bilaterally, bilateral BKA Cardiac:  Tachycardic, irregularly irregular, S1, S2; RRR; systolic murmur Lungs: bibasilar crackles, breathing comfortable   Abd: soft, nontender, no hepatomegaly  Ext: no edema Musculoskeletal:  No deformities, BUE strength grossly intact Skin: warm and dry  Neuro:  CNs 2-12 intact, no focal abnormalities noted Psych:  Normal affect   EKG:  The EKG was personally reviewed and demonstrates:  Atrial fibrillation at rate 135 with LVH  Telemetry:  Telemetry was personally reviewed and demonstrates:  Atrial fibrillation with rates in 120s-130s.   Relevant CV Studies: TTE 01/28/2019 1. The left ventricle has normal systolic function with an ejection fraction of  60-65%. The cavity size was normal. Left ventricular diastolic Doppler parameters are consistent with impaired relaxation. 2. The right ventricle has normal systolic function. The cavity was normal. There is no increase in right ventricular wall thickness. 3. The aortic valve is tricuspid. Mild thickening of the aortic valve. Mild calcification of the aortic valve. Mild stenosis of the aortic valve. 4. Peak and mean gradients through the AV are 23 and 12 mm Hg respectively consistent with mild AS. Since echo from Jan 2020, gradients are mildly increased.  NM Stress 01/21/2019  Nuclear stress EF: 46%.  There was no ST segment deviation noted during stress.  The left ventricular ejection fraction is mildly decreased (45-54%).  Mild wall motion abnormalities of anterior and anterolateral wall on this imaging. Normal perfusion in this area. Would consider echo for formal evaluation of wall motion and EF.  No prior study for  comparison.  Laboratory Data:  High Sensitivity Troponin:   Recent Labs  Lab 01/03/20 2306  TROPONINIHS 55*     Chemistry Recent Labs  Lab 01/02/20 0353 01/03/20 0305 01/03/20 2306  NA 143 144 146*  K 3.6 3.1* 3.0*  CL 116* 106 115*  CO2 8* 22 13*  GLUCOSE 260* 204* 194*  BUN 97* 89* 92*  CREATININE 5.50* 5.35* 5.45*  CALCIUM 7.9* 7.3* 7.2*  GFRNONAA 10* 10* 10*  GFRAA 12* 12* 12*  ANIONGAP 19* 16* 18*    Recent Labs  Lab 01/01/20 1100 01/01/20 1100 01/02/20 0353 01/03/20 0305 01/03/20 2306  PROT 4.6*  --  4.8* 4.7*  --   ALBUMIN 2.0*   < > 2.1* 2.0* 1.8*  AST 16  --  14* 11*  --   ALT 13  --  14 12  --   ALKPHOS 50  --  52 59  --   BILITOT 0.9  --  1.0 0.9  --    < > = values in this interval not displayed.   Hematology Recent Labs  Lab 12/30/19 1819 01/02/20 0353 01/03/20 0305  WBC 13.3* 11.4* 9.6  RBC 3.70* 2.85* 2.92*  HGB 10.4* 8.0* 8.4*  HCT 34.2* 25.5* 25.9*  MCV 92.4 89.5 88.7  MCH 28.1 28.1 28.8  MCHC 30.4 31.4 32.4  RDW 15.2 15.5 15.8*  PLT 161 142* 167   BNPNo results for input(s): BNP, PROBNP in the last 168 hours.  DDimer No results for input(s): DDIMER in the last 168 hours.  Radiology/Studies:  DG Chest 1 View  Result Date: 01/04/2020 CLINICAL DATA:  Pulmonary edema EXAM: CHEST  1 VIEW COMPARISON:  10/12/2019 FINDINGS: The heart size and mediastinal contours are within normal limits. Both lungs are clear. The visualized skeletal structures are unremarkable. IMPRESSION: No active disease. Electronically Signed   By: Ulyses Jarred M.D.   On: 01/04/2020 01:21   US Renal Transplant w/Doppler  Result Date: 12/31/2019 CLINICAL DATA:  Acute renal failure. History of a renal transplantation. EXAM: ULTRASOUND OF RENAL TRANSPLANT WITH RENAL DOPPLER ULTRASOUND TECHNIQUE: Ultrasound examination of the renal transplant was performed with gray-scale, color and duplex doppler evaluation. COMPARISON:  CT, 04/23/2019. FINDINGS: Transplant kidney  location: RLQ Transplant Kidney: Renal measurements: 11.1 x 4.3 x 5.2 cm = volume: 130.81m. Normal in size and parenchymal echogenicity. No evidence of mass or hydronephrosis. Trace perinephric fluid adjacent to the lower pole. Color flow in the main renal artery:  Yes Color flow in the main renal vein:  Yes Duplex Doppler Evaluation: Main Renal Artery Resistive Index: 0.87. Velocity, peak systolic,  125.8 centimeters/second. Venous waveform in main renal vein:  Present, 19 centimeters/second Intrarenal resistive index in upper pole:  0.74 (normal 0.6-0.8; equivocal 0.8-0.9; abnormal >= 0.9) Intrarenal resistive index in lower pole: 0.97 (normal 0.6-0.8; equivocal 0.8-0.9; abnormal >= 0.9) Bladder: Some debris.  No wall thickening, stone or mass. Other findings:  None. IMPRESSION: 1. Abnormally elevated resistive index in the lower pole of the transplant kidney with loss of the diastolic waveform. Resistive index measures 0.97. 2. Trace perinephric fluid adjacent to the lower pole. 3. No other abnormality. Electronically Signed   By: Lajean Manes M.D.   On: 12/31/2019 13:59   CT EXTREMITY LOWER RIGHT WO CONTRAST  Result Date: 12/31/2019 CLINICAL DATA:  Pain and swelling at right BKA stump EXAM: CT OF THE LOWER RIGHT EXTREMITY WITHOUT CONTRAST TECHNIQUE: Multidetector CT imaging of the right lower extremity was performed according to the standard protocol. COMPARISON:  None available FINDINGS: Bones/Joint/Cartilage Patient is status post below-knee amputation of the right lower extremity. Subtle loss of the cortical definition of the distal amputation margin of the tibia (series 9, image 47; series 7, image 82). The residual fibula appears intact with preservation of the distal cortex. The patella and visualized distal femur appear intact. Bones are demineralized. No acute fracture. No dislocation. Joint spaces are relatively preserved. Small nonspecific knee joint effusion. Ligaments Suboptimally assessed by CT.  Collateral ligaments appear grossly intact. Muscles and Tendons No acute musculotendinous abnormality. Soft tissues Swelling and edema at the distal stump with possible cutaneous blister (series 8, image 74). There is a more focal slightly hyperdense collection at the posterolateral aspect of the distal stump measuring approximately 2.6 x 1.7 x 2.6 cm (series 4, image 54), which could represent a hematoma. No soft tissue gas. Extensive vascular calcifications. IMPRESSION: 1. Status post below-knee amputation of the right lower extremity. Subtle loss of the cortical definition of the distal amputation margin of the tibia, raises the suspicion for early acute osteomyelitis. 2. Swelling and edema at the distal stump with possible cutaneous blister. There is a more focal hyperdense collection at the posterolateral aspect of the distal stump measuring approximately 2.6 x 1.7 x 2.6 cm, which could represent a hematoma. 3. Small nonspecific knee joint effusion. Electronically Signed   By: Davina Poke D.O.   On: 12/31/2019 13:23    Assessment and Plan:   1. Atrial fibrillation with RVR Most likely triggered by infection and renal dysfunction. Blood pressure is low but he has no evidence of end-organ dysfunction or poor perfusion state. He has normal LVEF and normal RV function. Recommend against electrical cardioversion, particularly as he is not on anticoagulation at this time.  - 150 mg IV amiodarone over 30 minutes followed by amiodarone 400 mg BID  - Recommend against digoxin due to fluctuating renal function and GFR <15.  - TTE in the morning  - If echo shows normal LVEF and SBP remains >100, recommend starting low dose beta blocker for rate control, starting with metoprolol tartrate 12.5 mg BID - Recommend heparin drip for stroke prevention which can be safely held in the perioperative period      For questions or updates, please contact [facility] Please consult [email or web address] for contact info  under    Signed, Osvaldo Shipper, MD  01/04/2020 1:43 AM

## 2020-01-04 NOTE — Progress Notes (Signed)
   01/03/20 2157  Assess: MEWS Score  Temp 98.2 F (36.8 C)  BP (!) 93/46  Pulse Rate (!) 104  Resp 14  SpO2 92 %  O2 Device Room Air  Assess: MEWS Score  MEWS Temp 0  MEWS Systolic 1  MEWS Pulse 1  MEWS RR 0  MEWS LOC 0  MEWS Score 2  MEWS Score Color Yellow  Assess: if the MEWS score is Yellow or Red  Were vital signs taken at a resting state? Yes  Focused Assessment No change from prior assessment  Early Detection of Sepsis Score *See Row Information* Low  MEWS guidelines implemented *See Row Information* Yes  Treat  MEWS Interventions Escalated (See documentation below)  Pain Scale 0-10  Pain Score 0  Take Vital Signs  Increase Vital Sign Frequency  Yellow: Q 2hr X 2 then Q 4hr X 2, if remains yellow, continue Q 4hrs  Escalate  MEWS: Escalate Yellow: discuss with charge nurse/RN and consider discussing with provider and RRT  Notify: Charge Nurse/RN  Name of Charge Nurse/RN Notified Charito RN  Date Charge Nurse/RN Notified 01/03/20  Time Charge Nurse/RN Notified 2200  Notify: Provider  Provider Name/Title Inda Merlin MD  Date Provider Notified 01/03/20  Time Provider Notified 2233  Notification Type Page  Notification Reason Other (Comment) (new afib)  Response See new orders  Date of Provider Response 01/03/20  Time of Provider Response 2250

## 2020-01-04 NOTE — Progress Notes (Addendum)
When trying to administer 8am meds, patient couldn't seem to make the connection between sucking straw for water to swallow meds, and just rolled meds around in his mouth---I sent note to Dr. Louanne Belton to ask for speech eval.  Also, per Dr Louanne Belton, ok to stop heparin gtt for MRI imaging (appx 30 min)

## 2020-01-04 NOTE — Telephone Encounter (Signed)
Per Dr. Louanne Skye this pt is in the hospital with a blister of his BKA anterior distal tib MRI today due to CT findings of possible early osteo. Kidney transplant on anti rejection, steroid and DM pt. Wants you to follow.

## 2020-01-05 ENCOUNTER — Ambulatory Visit: Payer: Medicare Other

## 2020-01-05 DIAGNOSIS — Z89512 Acquired absence of left leg below knee: Secondary | ICD-10-CM

## 2020-01-05 DIAGNOSIS — D638 Anemia in other chronic diseases classified elsewhere: Secondary | ICD-10-CM

## 2020-01-05 DIAGNOSIS — E1169 Type 2 diabetes mellitus with other specified complication: Secondary | ICD-10-CM

## 2020-01-05 DIAGNOSIS — A419 Sepsis, unspecified organism: Secondary | ICD-10-CM

## 2020-01-05 DIAGNOSIS — Z89511 Acquired absence of right leg below knee: Secondary | ICD-10-CM

## 2020-01-05 DIAGNOSIS — Z992 Dependence on renal dialysis: Secondary | ICD-10-CM

## 2020-01-05 DIAGNOSIS — N186 End stage renal disease: Secondary | ICD-10-CM

## 2020-01-05 LAB — CBC
HCT: 22.6 % — ABNORMAL LOW (ref 39.0–52.0)
Hemoglobin: 7.4 g/dL — ABNORMAL LOW (ref 13.0–17.0)
MCH: 29.4 pg (ref 26.0–34.0)
MCHC: 32.7 g/dL (ref 30.0–36.0)
MCV: 89.7 fL (ref 80.0–100.0)
Platelets: 140 10*3/uL — ABNORMAL LOW (ref 150–400)
RBC: 2.52 MIL/uL — ABNORMAL LOW (ref 4.22–5.81)
RDW: 15.7 % — ABNORMAL HIGH (ref 11.5–15.5)
WBC: 8.8 10*3/uL (ref 4.0–10.5)
nRBC: 0 % (ref 0.0–0.2)

## 2020-01-05 LAB — COMPREHENSIVE METABOLIC PANEL
ALT: 13 U/L (ref 0–44)
AST: 13 U/L — ABNORMAL LOW (ref 15–41)
Albumin: 1.7 g/dL — ABNORMAL LOW (ref 3.5–5.0)
Alkaline Phosphatase: 58 U/L (ref 38–126)
Anion gap: 11 (ref 5–15)
BUN: 41 mg/dL — ABNORMAL HIGH (ref 8–23)
CO2: 25 mmol/L (ref 22–32)
Calcium: 7.9 mg/dL — ABNORMAL LOW (ref 8.9–10.3)
Chloride: 104 mmol/L (ref 98–111)
Creatinine, Ser: 3.5 mg/dL — ABNORMAL HIGH (ref 0.61–1.24)
GFR calc Af Amer: 20 mL/min — ABNORMAL LOW (ref >60)
GFR calc non Af Amer: 17 mL/min — ABNORMAL LOW (ref >60)
Glucose, Bld: 151 mg/dL — ABNORMAL HIGH (ref 70–99)
Potassium: 3.6 mmol/L (ref 3.5–5.1)
Sodium: 140 mmol/L (ref 135–145)
Total Bilirubin: 0.9 mg/dL (ref 0.3–1.2)
Total Protein: 4.5 g/dL — ABNORMAL LOW (ref 6.5–8.1)

## 2020-01-05 LAB — HEPARIN LEVEL (UNFRACTIONATED): Heparin Unfractionated: 0.41 IU/mL (ref 0.30–0.70)

## 2020-01-05 LAB — HEPATITIS B SURFACE ANTIBODY, QUANTITATIVE: Hep B S AB Quant (Post): 259.8 m[IU]/mL (ref >9.9)

## 2020-01-05 LAB — GLUCOSE, CAPILLARY
Glucose-Capillary: 159 mg/dL — ABNORMAL HIGH (ref 70–99)
Glucose-Capillary: 129 mg/dL — ABNORMAL HIGH (ref 70–99)
Glucose-Capillary: 134 mg/dL — ABNORMAL HIGH (ref 70–99)
Glucose-Capillary: 101 mg/dL — ABNORMAL HIGH (ref 70–99)

## 2020-01-05 LAB — MAGNESIUM: Magnesium: 2 mg/dL (ref 1.7–2.4)

## 2020-01-05 MED ORDER — PENTAFLUOROPROP-TETRAFLUOROETH EX AERO: 1.0000 "application " | INHALATION_SPRAY | CUTANEOUS | Status: DC | PRN

## 2020-01-05 MED ORDER — PENTAFLUOROPROP-TETRAFLUOROETH EX AERO
1.0000 "application " | INHALATION_SPRAY | CUTANEOUS | Status: DC | PRN
  Filled 2020-01-05: qty 116

## 2020-01-05 MED ORDER — SODIUM CHLORIDE 0.9 % IV SOLN: 100.0000 mL | INTRAVENOUS | Status: DC | PRN

## 2020-01-05 MED ORDER — TACROLIMUS 1 MG PO CAPS
2.0000 mg | ORAL_CAPSULE | Freq: Two times a day (BID) | ORAL | Status: DC
  Administered 2020-01-05: 2 mg via ORAL
  Filled 2020-01-05: qty 2

## 2020-01-05 MED ORDER — ALTEPLASE 2 MG IJ SOLR: 2.0000 mg | Freq: Once | INTRAMUSCULAR | Status: DC | PRN

## 2020-01-05 MED ORDER — DARBEPOETIN ALFA 60 MCG/0.3ML IJ SOSY
60.0000 ug | PREFILLED_SYRINGE | INTRAMUSCULAR | Status: DC
  Administered 2020-01-06: 60 ug via INTRAVENOUS
  Filled 2020-01-05 (×2): qty 0.3

## 2020-01-05 MED ORDER — AMIODARONE HCL IN DEXTROSE 360-4.14 MG/200ML-% IV SOLN
30.0000 mg/h | INTRAVENOUS | Status: DC
  Administered 2020-01-05 – 2020-01-06 (×2): 30 mg/h via INTRAVENOUS
  Filled 2020-01-05 (×2): qty 200

## 2020-01-05 MED ORDER — LIDOCAINE HCL (PF) 1 % IJ SOLN: 5.0000 mL | INTRAMUSCULAR | Status: DC | PRN

## 2020-01-05 MED ORDER — LIDOCAINE-PRILOCAINE 2.5-2.5 % EX CREA: 1.0000 "application " | TOPICAL_CREAM | CUTANEOUS | Status: DC | PRN

## 2020-01-05 MED ORDER — HEPARIN SODIUM (PORCINE) 1000 UNIT/ML DIALYSIS
1000.0000 [IU] | INTRAMUSCULAR | Status: DC | PRN
  Filled 2020-01-05: qty 1

## 2020-01-05 MED ORDER — HEPARIN SODIUM (PORCINE) 1000 UNIT/ML DIALYSIS: 1000.0000 [IU] | INTRAMUSCULAR | Status: DC | PRN

## 2020-01-05 MED ORDER — VANCOMYCIN HCL IN DEXTROSE 750-5 MG/150ML-% IV SOLN
750.0000 mg | INTRAVENOUS | Status: DC
  Administered 2020-01-08: 750 mg via INTRAVENOUS
  Filled 2020-01-05 (×2): qty 150

## 2020-01-05 MED ORDER — LIDOCAINE-PRILOCAINE 2.5-2.5 % EX CREA
1.0000 "application " | TOPICAL_CREAM | CUTANEOUS | Status: DC | PRN
  Filled 2020-01-05: qty 5

## 2020-01-05 MED ORDER — TACROLIMUS 5 MG/ML IV SOLN
0.0100 mg/kg/d | INTRAVENOUS | Status: DC
  Administered 2020-01-05 – 2020-01-06 (×2): 0.01 mg/kg/d via INTRAVENOUS
  Filled 2020-01-05 (×2): qty 1

## 2020-01-05 MED ORDER — MYCOPHENOLATE MOFETIL HCL 500 MG IV SOLR
500.0000 mg | Freq: Two times a day (BID) | INTRAVENOUS | Status: DC
  Administered 2020-01-05 – 2020-01-07 (×4): 500 mg via INTRAVENOUS
  Filled 2020-01-05 (×5): qty 15

## 2020-01-05 MED ORDER — ALTEPLASE 2 MG IJ SOLR
2.0000 mg | Freq: Once | INTRAMUSCULAR | Status: DC | PRN
  Filled 2020-01-05: qty 2

## 2020-01-05 NOTE — Progress Notes (Signed)
  Speech Language Pathology Treatment: Dysphagia  Patient Details Name: Paul [name] MRN: [id number] DOB: 20-Feb-1956 Today's Date: 01/05/2020 Time: 3235-5732 SLP Time Calculation (min) (ACUTE ONLY): 15 min  Assessment / Plan / Recommendation Clinical Impression  Pt able to awaken and attend to Po today. He accepted single straw sips of water with a slighlty prolonged oral hold, but adequate appearing function. No signs of aspiration. Also tolerated puree and cracker in a similar fashion. Pt remains confused, but does not show signs of dysphagia. Advised RN to attempt pills whole with water but to look out for pocketing given mentation. Pt may initiate a regular diet and thin liquids.   HPI HPI: 64yo male admitted 12/30/19 with right stump pain and rash. PMH: kidney transplant (2016), CKD4, HTN, IDDM, chronic AFib, B/L BKA due to PVD, GERD, duodenal adenoCA (2002),d epression, ESRD - HD.[name] CXR no active disease      SLP Plan  All goals met       Recommendations  Diet recommendations: Regular;Thin liquid Liquids provided via: Straw Medication Administration: Whole meds with liquid Supervision: Staff to assist with self feeding Postural Changes and/or Swallow Maneuvers: Seated upright 90 degrees                Plan: All goals met       GO               Herbie Baltimore, MA CCC-SLP  Acute Rehabilitation Services Pager [phone] Office [phone]  Lynann Beaver 01/05/2020, 1:11 PM

## 2020-01-05 NOTE — Progress Notes (Signed)
According to speech and language pathologist, patient is recommended to be NPO. No meds for the night  given. Patient kept NPO. Patient has been lethargic and responds to voice only. MD made aware.

## 2020-01-05 NOTE — Progress Notes (Signed)
Renal Navigator spoke with patient's wife/Pamela while patient was in MRI today. Spouse listed as ER contact in Epic. Navigator discussed plans for OP HD after discharge per notification by Nephrologist/Dr. Joylene Grapes. [name] states patient dialyzed at [facility] in the past, before his transplant. She states he did very well. She asked that patient be referred to [facility] again. She states he utilized [facility] in the past and would like an application be completed for [facility] again. Navigator agreed and informed patient's wife of name change from [facility] to [facility]. Renal Navigator submitted OP HD referral for ESRD to [facility] to request treatment at [facility]. Navigator will assist with transportation as able.  Navigator will monitor closely.  Alphonzo Cruise, [place] Renal Navigator [phone]

## 2020-01-05 NOTE — Progress Notes (Signed)
[facility] Progress Note   64 y.o.maleDDRT 2015 CKD4, AoCKD Afib DM HTN b/l BKA h/o duodenal adenocarcinoma s/p resection. 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 Currently on Myfortic, Tac and prednisone. Baseline Cr is2.8-3.4(as recently as 10/12/2019) but appears to be slowly worsening as an outpatient. Patient is here with leg pain and states that the pain to the right leg stump has worsened over the past few days but denies any fever.  Now with subsequent renal failure  Assessment/ Plan:   1. AKI on CKD4 now ESRD: BL cr 2.8-3.4 s/p DDRT with DGF complicated by ACR and antibody mediated rejection requiring IVIG as recently as 2018.  Unfortunately with minimal urine output and creatinine not improving.  Tolerated dialysis yesterday with 1 L of ultrafiltration.  Given the patient's failure to improve and significant baseline kidney dysfunction I think he is end-stage renal disease dependent on dialysis. 1. Continue dialysis TTS schedule-Next dialysis Thursday 2. Continue tacrolimus, mycophenolate, prednisone-taper tacrolimus to 2 mg twice daily 3. Continue with clip process 4. Patient has functional dialysis access 2. Anion gap metabolic acidosis and non-anion gap metabolic acidosis: Anion gap acidosis resolved with dialysis.  Non-anion gap metabolic acidosis also improved.  Holding his sodium bicarb given difficulties with oral medications 2. DM - poorly controlled. Per primary. 3. HTN -blood pressure fluctuating.  Holding medications at this time. 4. Pancreatic insufficiency -continue Creon 5. Hyperphosphatemia: Continue PhosLo when eating.  N.p.o. at this time. 6. Anemia: Hemoglobin 7.4.  Ferritin 657 and iron sat 81.  Iron not an option.  Start aranesp 51mcg weekly.  Subjective:   Patient minimally interactive at this time.  Inability to swallow and now n.p.o denies any complaints..   Objective:   BP (!) 143/66   Pulse 72   Temp 100.2 F  (37.9 C) (Axillary)   Resp 13   Ht 6' (1.829 m)   Wt 65.1 kg   SpO2 100%   BMI 19.46 kg/m   Intake/Output Summary (Last 24 hours) at 01/05/2020 1023 Last data filed at 01/05/2020 9485 Gross per 24 hour  Intake 685.12 ml  Output 1250 ml  Net -564.88 ml   Weight change:   Physical Exam: GEN: NAD, lying in bed HEENT: No nasal discharge, moist mucous membranes LUNGS: Bilateral chest rise, no increased work of breathing CV: Normal rate, no upper extremity edema ABD: Positive bowel sounds, no distention EXT: B/L BKA; right stump covered ACCESS: Rt Cimino with good bruit  Imaging: DG Chest 1 View  Result Date: 01/04/2020 CLINICAL DATA:  Pulmonary edema EXAM: CHEST  1 VIEW COMPARISON:  10/12/2019 FINDINGS: The heart size and mediastinal contours are within normal limits. Both lungs are clear. The visualized skeletal structures are unremarkable. IMPRESSION: No active disease. Electronically Signed   By: Ulyses Jarred M.D.   On: 01/04/2020 01:21   MR TIBIA FIBULA RIGHT WO CONTRAST  Result Date: 01/04/2020 CLINICAL DATA:  Possible osteomyelitis. EXAM: MRI OF LOWER RIGHT EXTREMITY WITHOUT CONTRAST TECHNIQUE: Multiplanar MR images of the remaining tibia/fibula were attempted. The has a below the knee amputation. There were cooperation issues and patient motion related issues because of this the distal stump and part of the tibia and fibula were not included. The technologist reports that this was the best exam the could be achieved due to cooperation related issues. COMPARISON:  CT scan 12/31/2019 FINDINGS: Despite efforts by the technologist and patient, motion artifact is present on today's exam and could not be eliminated. This reduces exam sensitivity  and specificity. Bones/Joint/Cartilage No findings of osteomyelitis in the distal femur, in the patella, or in the included proximal 6 cm of the tibia. Due to cooperation related issues, we did not visualize about 2 cm of the distal most tibia or  the overlying stump. A knee effusion is present. No compelling degree of synovitis to suggest that this is from septic joint, and no abnormal marrow signal along the joint surfaces. Ligaments The anterior and posterior cruciate ligaments appear intact. Muscles and Tendons No unexpected findings. Soft tissues Mild diffuse subcutaneous edema. IMPRESSION: 1. No findings of osteomyelitis in the distal femur, patella, or in the included proximal 6 cm of the tibia. Please note that the patient cooperation related issues, the distal 2 cm of the tibia/fibula, and the overlying stump, were not included in imaging. This includes the hyperdense collection previously seen along the stump which was thought to potentially be a hematoma on the prior CT from 12/31/2019. 2. No drainable abscess. 3. Small knee effusion. I am skeptical of septic joint given the lack of synovitis. 4. Despite efforts by the technologist and patient, motion artifact is present on today's exam and could not be eliminated. This reduces exam sensitivity and specificity. Electronically Signed   By: Van Clines M.D.   On: 01/04/2020 20:16    Labs: BMET Recent Labs  Lab 12/31/19 1115 01/01/20 1100 01/02/20 0353 01/03/20 0305 01/03/20 2306 01/04/20 0727 01/05/20 0758  NA 142 143 143 144 146* 144 140  K 3.7 2.6* 3.6 3.1* 3.0* 4.1 3.6  CL 119* 116* 116* 106 115* 111 104  CO2 <7* 10* 8* 22 13* 16* 25  GLUCOSE 254* 186* 260* 204* 194* 240* 151*  BUN 92* 93* 97* 89* 92* 102* 41*  CREATININE 5.25* 5.29* 5.50* 5.35* 5.45* 6.11* 3.50*  CALCIUM 8.4* 7.7* 7.9* 7.3* 7.2* 7.7* 7.9*  PHOS  --   --  9.0* 8.5* 8.0*  --   --    CBC Recent Labs  Lab 12/30/19 1819 12/30/19 1819 01/02/20 0353 01/03/20 0305 01/04/20 0727 01/05/20 0758  WBC 13.3*   < > 11.4* 9.6 8.6 8.8  NEUTROABS 10.1*  --   --   --   --   --   HGB 10.4*   < > 8.0* 8.4* 7.6* 7.4*  HCT 34.2*   < > 25.5* 25.9* 24.1* 22.6*  MCV 92.4   < > 89.5 88.7 90.9 89.7  PLT 161   < >  142* 167 169 140*   < > = values in this interval not displayed.    Medications:    . aspirin EC  81 mg Oral Daily  . atorvastatin  10 mg Oral Q M,W,F  . calcium acetate  2,001 mg Oral TID WC  . Chlorhexidine Gluconate Cloth  6 each Topical Q0600  . cholecalciferol  4,000 Units Oral Daily  . [START ON 01/06/2020] darbepoetin (ARANESP) injection - DIALYSIS  60 mcg Intravenous Q Thu-HD  . insulin aspart  0-9 Units Subcutaneous TID WC  . lipase/protease/amylase  12,000 Units Oral TID AC  . magnesium chloride  2 tablet Oral BID  . mycophenolate  360 mg Oral BID  . pantoprazole  40 mg Oral Daily  . predniSONE  5 mg Oral Q breakfast  . sertraline  50 mg Oral Daily  . sodium bicarbonate  1,300 mg Oral TID  . tacrolimus  3 mg Oral Daily   And  . tacrolimus  2 mg Oral Daily  . tamsulosin  0.4  mg Oral QPC supper      Reesa Chew  01/05/2020, 10:23 AM

## 2020-01-05 NOTE — Progress Notes (Signed)
[facility] for Heparin Indication: atrial fibrillation  Allergies  Allergen Reactions  . Metformin And Related Other (See Comments)    Has kidney problems    Patient Measurements: Height: 6' (182.9 cm) Weight: 65.1 kg (143 lb 8.3 oz) IBW/kg (Calculated) : 77.6  Vital Signs: Temp: 98.7 F (37.1 C) (08/18 1100) Temp Source: Oral (08/18 1100) BP: 136/65 (08/18 1200) Pulse Rate: 71 (08/18 1200)  Labs: Recent Labs    01/03/20 0305 01/03/20 0305 01/03/20 2306 01/04/20 0727 01/04/20 0751 01/04/20 2107 01/05/20 0758  HGB 8.4*   < >  --  7.6*  --   --  7.4*  HCT 25.9*  --   --  24.1*  --   --  22.6*  PLT 167  --   --  169  --   --  140*  APTT  --   --   --   --  32  --   --   LABPROT  --   --   --   --  14.7  --   --   INR  --   --   --   --  1.2  --   --   HEPARINUNFRC  --   --   --   --  <0.10* 0.19* 0.41  CREATININE 5.35*   < > 5.45* 6.11*  --   --  3.50*  TROPONINIHS  --   --  55* 56*  --   --   --    < > = values in this interval not displayed.    Estimated Creatinine Clearance: 19.1 mL/min (A) (by C-G formula based on SCr of 3.5 mg/dL (H)).   Medical History: Past Medical History:  Diagnosis Date  . A-fib ([place])   . Anemia    esrd  . Calciphylaxis 05/2013   lower ext  . Cancer ([place])    hx duodenal adenoCa 2002, resected  . Closed left arm fracture 1967  . Corneal abrasion, left   . Depression   . Diabetes mellitus    Type 1 iddm x 11 yrs  . Diabetic neuropathy ([place])   . ESRD on hemodialysis ([place])    Pt has ESRD due to DM.  He had a L upper arm AVF prior to starting HD which was ligated due to L arm steal syndrome.  He started HD in Jan 2014 and did home HD.  The family couldn't cannulate the R arm AVF successfully so by mid 2014 they decided to switch to PD which was done late summer 2014.  In Jan 2015 he was admitted with FTT felt to be due to underdialysis on PD and PD was abandoned and he   . GERD (gastroesophageal  reflux disease)   . Glomerulosclerosis, diabetic ([place])   . Heart murmur   . History of blood transfusion   . Hypertension    off of meds due to orthostatic hypotension  . Kidney transplant recipient   . Open wound of both legs with complication    Pt has had progressive wounds of both LE's including gangrene of the toes and patchy necrosis of the calves.  He has been treated at [facility] by Dr Jerline Pain with hyperbaric O2 5d per week.  He is getting Na Thiosulfate with HD for suspected calciphylaxis. Pt says doctor's aren't sure if these ulcers were diabetic ulcers or calciphylaxis.  He underwent bilat BKA on 07/30/13.  He says that the woun  Medications:  No current facility-administered medications on file prior to encounter.   Current Outpatient Medications on File Prior to Encounter  Medication Sig Dispense Refill  . acetaminophen (TYLENOL) 500 MG tablet Take 500 mg by mouth every 6 (six) hours as needed for mild pain or moderate pain.    [name] amLODipine (NORVASC) 10 MG tablet Take 10 mg by mouth daily.    [name] aspirin 81 MG chewable tablet Chew 81 mg by mouth daily.    [name] atorvastatin (LIPITOR) 10 MG tablet Take 1 tablet (10 mg total) by mouth daily. (Patient taking differently: Take 10 mg by mouth every Monday, Wednesday, and Friday. ) 30 tablet 1  . calcitRIOL (ROCALTROL) 0.25 MCG capsule Take 0.25 mcg by mouth daily.     . calcium acetate (PHOSLO) 667 MG capsule Take 1,334 mg by mouth 3 (three) times daily with meals.    . CVS D3 50 MCG (2000 UT) CAPS Take 4,000 Units by mouth daily.    [name] ELIQUIS 5 MG TABS tablet TAKE 1 TABLET BY MOUTH TWICE A DAY (Patient taking differently: Take 5 mg by mouth 2 (two) times daily. ) 60 tablet 4  . feeding supplement, GLUCERNA SHAKE, (GLUCERNA SHAKE) LIQD Take 237 mLs by mouth 3 (three) times daily between meals.    [name] HUMALOG KWIKPEN 100 UNIT/ML KwikPen Inject 4-8 Units into the skin daily. Sliding Scale    . hydrALAZINE (APRESOLINE) 100 MG tablet Take  100 mg by mouth 2 (two) times daily. Afternoon, bedtime    . Insulin Glargine (BASAGLAR KWIKPEN) 100 UNIT/ML SOPN Inject 10 Units into the skin daily.     . magnesium chloride (SLOW-MAG) 64 MG TBEC SR tablet Take 2 tablets by mouth 2 (two) times daily. Take at least 2 hours separate from tacrolimus.    . mycophenolate (MYFORTIC) 180 MG EC tablet Take 360 mg by mouth 2 (two) times daily.     [name] omeprazole (PRILOSEC) 40 MG capsule Take 40 mg by mouth at bedtime.     . predniSONE (DELTASONE) 5 MG tablet Take 5 mg by mouth daily with breakfast.     . sertraline (ZOLOFT) 50 MG tablet Take 50 mg by mouth at bedtime.     . sodium bicarbonate 650 MG tablet Take 1,950 mg by mouth 4 (four) times daily.    . tacrolimus (PROGRAF) 1 MG capsule Take 2-3 mg by mouth See admin instructions. Take 3 mg by mouth in the morning at 9 AM and 2 mg in the evening at 9 PM    . tamsulosin (FLOMAX) 0.4 MG CAPS capsule Take 0.4 mg by mouth daily after supper.        Assessment: 3 yoM on apixaban PTA for hx AFib transitioned to IV heparin.  Heparin level at goal this morning on 1400 units/hr. Hgb still low at 7.4 but stable overall. Plt count 140. No overt bleeding noted.   Goal of Therapy:  Heparin level 0.3-0.7 units/ml Monitor platelets by anticoagulation protocol: Yes   Plan:  Continue heparin at 1400 units/h Recheck heparin level with morning labs  Erin Hearing PharmD., BCPS Clinical Pharmacist 01/05/2020 2:21 PM

## 2020-01-05 NOTE — Progress Notes (Signed)
Cardiology Progress Note  Patient ID: Paul [name] MRN: [id number] DOB: [birth year] Date of Encounter: 01/05/2020  Primary Cardiologist: Evalina Field, MD  Subjective   Chief Complaint: confused.   HPI: Confused this AM. In NSR. NPO due to AMS and swallowing difficulty.   ROS:  All other ROS reviewed and negative. Pertinent positives noted in the HPI.     Inpatient Medications  Scheduled Meds: . aspirin EC  81 mg Oral Daily  . atorvastatin  10 mg Oral Q M,W,F  . calcium acetate  2,001 mg Oral TID WC  . Chlorhexidine Gluconate Cloth  6 each Topical Q0600  . cholecalciferol  4,000 Units Oral Daily  . [START ON 01/06/2020] darbepoetin (ARANESP) injection - DIALYSIS  60 mcg Intravenous Q Thu-HD  . insulin aspart  0-9 Units Subcutaneous TID WC  . lipase/protease/amylase  12,000 Units Oral TID AC  . magnesium chloride  2 tablet Oral BID  . mycophenolate  360 mg Oral BID  . pantoprazole  40 mg Oral Daily  . predniSONE  5 mg Oral Q breakfast  . sertraline  50 mg Oral Daily  . sodium bicarbonate  1,300 mg Oral TID  . tacrolimus  3 mg Oral Daily   And  . tacrolimus  2 mg Oral Daily  . tamsulosin  0.4 mg Oral QPC supper   Continuous Infusions: . amiodarone    . ceFEPime (MAXIPIME) IV 2 g (01/04/20 1715)  . heparin 1,400 Units/hr (01/04/20 2222)  . vancomycin 1,000 mg (01/02/20 1356)   PRN Meds: acetaminophen **OR** acetaminophen, hydrALAZINE, HYDROmorphone (DILAUDID) injection, ondansetron (ZOFRAN) IV   Vital Signs   Vitals:   01/05/20 0011 01/05/20 0200 01/05/20 0400 01/05/20 0628  BP: 138/69 (!) 143/65 (!) 153/69   Pulse: 70 69 72   Resp: 14 19 13    Temp: 99.2 F (37.3 C) 98.7 F (37.1 C) 100.2 F (37.9 C)   TempSrc: Axillary Axillary Axillary   SpO2: 100% 100% 100%   Weight:    65.1 kg  Height:        Intake/Output Summary (Last 24 hours) at 01/05/2020 0746 Last data filed at 01/05/2020 0705 Gross per 24 hour  Intake 685.12 ml  Output 1250 ml  Net -564.88 ml    Last 3 Weights 01/05/2020 01/04/2020 01/04/2020  Weight (lbs) 143 lb 8.3 oz 141 lb 12.1 oz 143 lb 15.4 oz  Weight (kg) 65.1 kg 64.3 kg 65.3 kg      Telemetry  Overnight telemetry shows NSR 70s, which I personally reviewed.   ECG  The most recent ECG shows Afib with RVR, which I personally reviewed.   Physical Exam   Vitals:   01/05/20 0011 01/05/20 0200 01/05/20 0400 01/05/20 0628  BP: 138/69 (!) 143/65 (!) 153/69   Pulse: 70 69 72   Resp: 14 19 13    Temp: 99.2 F (37.3 C) 98.7 F (37.1 C) 100.2 F (37.9 C)   TempSrc: Axillary Axillary Axillary   SpO2: 100% 100% 100%   Weight:    65.1 kg  Height:         Intake/Output Summary (Last 24 hours) at 01/05/2020 0746 Last data filed at 01/05/2020 0705 Gross per 24 hour  Intake 685.12 ml  Output 1250 ml  Net -564.88 ml    Last 3 Weights 01/05/2020 01/04/2020 01/04/2020  Weight (lbs) 143 lb 8.3 oz 141 lb 12.1 oz 143 lb 15.4 oz  Weight (kg) 65.1 kg 64.3 kg 65.3 kg    Body mass index  is 19.46 kg/m.   General: confused Head: Atraumatic, normal size  Eyes: PEERLA, EOMI  Neck: Supple, no JVD Endocrine: No thryomegaly Cardiac: Normal S1, S2; RRR; no murmurs, rubs, or gallops Lungs: Clear to auscultation bilaterally, no wheezing, rhonchi or rales  Abd: Soft, nontender, no hepatomegaly  Ext: s/p bilateral BKA Musculoskeletal: No deformities, BUE and BLE strength normal and equal Skin: Warm and dry, no rashes   Neuro: awake, alert, confused, will follow commands   Labs  High Sensitivity Troponin:   Recent Labs  Lab 01/03/20 2306 01/04/20 0727  TROPONINIHS 55* 56*     Cardiac EnzymesNo results for input(s): TROPONINI in the last 168 hours. No results for input(s): TROPIPOC in the last 168 hours.  Chemistry Recent Labs  Lab 01/02/20 0353 01/02/20 0353 01/03/20 0305 01/03/20 2306 01/04/20 0727  NA 143   < > 144 146* 144  K 3.6   < > 3.1* 3.0* 4.1  CL 116*   < > 106 115* 111  CO2 8*   < > 22 13* 16*  GLUCOSE 260*   <  > 204* 194* 240*  BUN 97*   < > 89* 92* 102*  CREATININE 5.50*   < > 5.35* 5.45* 6.11*  CALCIUM 7.9*   < > 7.3* 7.2* 7.7*  PROT 4.8*  --  4.7*  --  4.6*  ALBUMIN 2.1*   < > 2.0* 1.8* 1.8*  AST 14*  --  11*  --  9*  ALT 14  --  12  --  13  ALKPHOS 52  --  59  --  56  BILITOT 1.0  --  0.9  --  0.7  GFRNONAA 10*   < > 10* 10* 9*  GFRAA 12*   < > 12* 12* 10*  ANIONGAP 19*   < > 16* 18* 17*   < > = values in this interval not displayed.    Hematology Recent Labs  Lab 01/02/20 0353 01/03/20 0305 01/04/20 0727  WBC 11.4* 9.6 8.6  RBC 2.85* 2.92* 2.65*  HGB 8.0* 8.4* 7.6*  HCT 25.5* 25.9* 24.1*  MCV 89.5 88.7 90.9  MCH 28.1 28.8 28.7  MCHC 31.4 32.4 31.5  RDW 15.5 15.8* 16.1*  PLT 142* 167 169   BNPNo results for input(s): BNP, PROBNP in the last 168 hours.  DDimer No results for input(s): DDIMER in the last 168 hours.   Radiology  DG Chest 1 View  Result Date: 01/04/2020 CLINICAL DATA:  Pulmonary edema EXAM: CHEST  1 VIEW COMPARISON:  10/12/2019 FINDINGS: The heart size and mediastinal contours are within normal limits. Both lungs are clear. The visualized skeletal structures are unremarkable. IMPRESSION: No active disease. Electronically Signed   By: Ulyses Jarred M.D.   On: 01/04/2020 01:21   MR TIBIA FIBULA RIGHT WO CONTRAST  Result Date: 01/04/2020 CLINICAL DATA:  Possible osteomyelitis. EXAM: MRI OF LOWER RIGHT EXTREMITY WITHOUT CONTRAST TECHNIQUE: Multiplanar MR images of the remaining tibia/fibula were attempted. The has a below the knee amputation. There were cooperation issues and patient motion related issues because of this the distal stump and part of the tibia and fibula were not included. The technologist reports that this was the best exam the could be achieved due to cooperation related issues. COMPARISON:  CT scan 12/31/2019 FINDINGS: Despite efforts by the technologist and patient, motion artifact is present on today's exam and could not be eliminated. This reduces  exam sensitivity and specificity. Bones/Joint/Cartilage No findings of osteomyelitis in the  distal femur, in the patella, or in the included proximal 6 cm of the tibia. Due to cooperation related issues, we did not visualize about 2 cm of the distal most tibia or the overlying stump. A knee effusion is present. No compelling degree of synovitis to suggest that this is from septic joint, and no abnormal marrow signal along the joint surfaces. Ligaments The anterior and posterior cruciate ligaments appear intact. Muscles and Tendons No unexpected findings. Soft tissues Mild diffuse subcutaneous edema. IMPRESSION: 1. No findings of osteomyelitis in the distal femur, patella, or in the included proximal 6 cm of the tibia. Please note that the patient cooperation related issues, the distal 2 cm of the tibia/fibula, and the overlying stump, were not included in imaging. This includes the hyperdense collection previously seen along the stump which was thought to potentially be a hematoma on the prior CT from 12/31/2019. 2. No drainable abscess. 3. Small knee effusion. I am skeptical of septic joint given the lack of synovitis. 4. Despite efforts by the technologist and patient, motion artifact is present on today's exam and could not be eliminated. This reduces exam sensitivity and specificity. Electronically Signed   By: Van Clines M.D.   On: 01/04/2020 20:16    Cardiac Studies  TTE 01/28/2019 1. The left ventricle has normal systolic function with an ejection  fraction of 60-65%. The cavity size was normal. Left ventricular diastolic  Doppler parameters are consistent with impaired relaxation.  2. The right ventricle has normal systolic function. The cavity was  normal. There is no increase in right ventricular wall thickness.  3. The aortic valve is tricuspid. Mild thickening of the aortic valve.  Mild calcification of the aortic valve. Mild stenosis of the aortic valve.  4. Peak and mean  gradients through the AV are 23 and 12 mm Hg  respectively consistent with mild AS. Since echo from Jan 2020, gradients  are mildly increased.   Patient Profile  Paul [name] is a 64 y.o. male with ESRD s/p kidney transplant (with failed graft), pAF, DM, PAD s/p bilateral BKA who was admitted on 12/30/2019 for right leg osteomyelitis. Course has been complicated by AKI requiring HD and infection that will likely require surgery.   Assessment & Plan   1. Paroxysmal Afib -2/2 infection, uremia. NPO. Went into Afib overnight 8/16 and converted with amiodarone bolus. -continue amiodarone drip for now -he is in NSR -we will plan to transition to 1 month of oral therapy of amio to maintain NSR when able -heparin drip for now. Resume home AC when no planned procedures.   For questions or updates, please contact [place] Please consult [email or web address] for contact info under   Time Spent with Patient: I have spent a total of 15 minutes with patient reviewing hospital notes, telemetry, EKGs, labs and examining the patient as well as establishing an assessment and plan that was discussed with the patient.  > 50% of time was spent in direct patient care.    Signed, [place] Naegeli. Audie Box, [place]  01/05/2020 7:46 AM

## 2020-01-05 NOTE — Progress Notes (Addendum)
PROGRESS NOTE    Paul [name]  HDQ:[id number] DOB: [birth year] DOA: 12/30/2019 PCP: Paul Amel, MD   Brief Narrative:  HPI on 12/31/2019 by Dr. Wynetta Fines Paul [name] is a 64 y.o. male with medical history significant of kidney transplant recipient (2016) with CKD stage IV, hypertension, IDDM, chronic A. fib on Xarelto, B/L BKA secondary to PVD, presented with new onset of right BKA stump pain and rash.  Symptoms started about 1 week ago, patient attributed that to a unfit prosthetic which might caused a skin abrasions.  He started to notice significant pain rash associated the end of right leg stump about 3 days ago.  Pain has been constant and gradually getting worse, denies any fever chills.  He took some Tylenol for the pain without any relief.  Patient has been following with [facility] for the kidney transplant, he was seen in the office about 2 weeks ago and multiple blood work and urine analysis sent, urine protein/Cre>5,000, patient however was not informed about results yet.  And patient claims his kidney function has been stable, denies any back pain, decreased urine output or urine color.  Interim history  Patient was admitted with sepsis secondary to right leg osteomyelitis with an abscess on BKA stump.  She was placed on IV antibiotics with vancomycin and cefepime.  Patient will need incision and drainage.  Also noted to have atrial fibrillation with RVR, cardiology consulted.  Nephrology consulted patient is having AKI on CKD, stage IV.  Assessment & Plan   Severe sepsis secondary to right leg osteomyelitis and abscess on BKA stump site -Sepsis was present on admission -CT scan of lower extremity showed evidence of early osteomyelitis and possible abscess of source of infection -Continue vancomycin and cefepime -Orthopedic surgery consulted and appreciated, recommended MRI of the affected extremity-which showed no findings of osteomyelitis in the distal femur,  patella or 60 m of tibia.  Unfortunately patient had cooperation issues therefore the distal 2 cm of the tibia/fibula and overlying stump were not included in the imaging, especially hyperdense collection noted on CT scan on 12/31/2019.  Small knee effusion, skeptical of septic joint given lack of synovitis. -CRP mildly elevated -Patient may need incision and drainage  Acute metabolic encephalopathy -likely secondary to the above -currently lethargic, only alert to self -It seems that speech worked with him on previous days and found that he was lethargic and recommended n.p.o. -if patient does not improve, will obtain CT head  Atrial fibrillation with RVR -Patient with chronic atrial fibrillation -Noted to have tachycardia with hypotension -Cardiology consulted and appreciated -Placed on amiodarone drip -Eliquis has been held, currently on heparin drip  Acute kidney injury on chronic kidney disease, stage IV with severe metabolic acidosis  -history of renal transplant -Nephrology consulted and appreciated, patient tolerated HD -Patient received bicarb for metabolic acidosis -Continue tacrolimus, prednisone, mycophenolate- will speak to pharmacy about changing these medications to IV -Creatinine down to 3.5 -Continue to monitor BMP  Hypokalemia -Currently 3.6, will continue to monitor and replace as needed  Diabetes mellitus, type II, uncontrolled with hyperglycemia -Hemoglobin A1c 10.3 -patient is on low-dose prednisone -Continue insulin sliding scale and CBG monitoring  Mild hypomagnesemia  -Resolved with replacement, continue to monitor and replace as needed  History of pancreatic insufficiency -Patient had duodenal adenocarcinoma status post resection -Continue Creon which was added on 01/01/2020  Essential hypertension -Patient was mildly hypotensive -Clonidine held -Continue hydralazine with holding parameters  Hyperlipidemia -continue statin  Anemia  of chronic  disease -Hemoglobin 7.4 today, continue to monitor CBC and transfuse if needed   DVT Prophylaxis Heparin drip  Code Status: Full  Family Communication: None at bedside  Disposition Plan:  Status is: Inpatient  Remains inpatient appropriate because:Hemodynamically unstable, Persistent severe electrolyte disturbances, Ongoing diagnostic testing needed not appropriate for outpatient work up and Inpatient level of care appropriate due to severity of illness   Dispo:  Patient From: Home  Planned Disposition: Home with Health Care S[facility]  Expected discharge date: TBD  Medically stable for discharge: No   Consultants Orthopedic surgery Cardiology Nephrology  Procedures  None  Antibiotics   Anti-infectives (From admission, onward)   Start     Dose/Rate Route Frequency Ordered Stop   01/04/20 1130  vancomycin (VANCOCIN) 1-5 GM/200ML-% IVPB       Note to Pharmacy: Paul [name]   : cabinet override      01/04/20 1130 01/04/20 1333   12/31/19 1400  vancomycin (VANCOCIN) IVPB 1000 mg/200 mL premix     Discontinue     1,000 mg 200 mL/hr over 60 Minutes Intravenous Every 48 hours 12/31/19 1347     12/31/19 1345  ceFEPIme (MAXIPIME) 2 g in sodium chloride 0.9 % 100 mL IVPB     Discontinue     2 g 200 mL/hr over 30 Minutes Intravenous Every 24 hours 12/31/19 1347     12/31/19 1330  vancomycin (VANCOCIN) IVPB 1000 mg/200 mL premix  Status:  Discontinued        1,000 mg 200 mL/hr over 60 Minutes Intravenous  Once 12/31/19 1315 12/31/19 1347      Subjective:   Paul [name] seen and examined today.  Patient appears confused today.  Able to tell me his name but not much else or answer any other questions.  Just grumbles.  Objective:   Vitals:   01/05/20 0400 01/05/20 0628 01/05/20 0749 01/05/20 0800  BP: (!) 153/69   (!) 143/66  Pulse: 72  73 72  Resp: 13  13 13   Temp: 100.2 F (37.9 C)     TempSrc: Axillary     SpO2: 100%  100%   Weight:  65.1 kg    Height:         Intake/Output Summary (Last 24 hours) at 01/05/2020 1035 Last data filed at 01/05/2020 0705 Gross per 24 hour  Intake 685.12 ml  Output 1250 ml  Net -564.88 ml   Filed Weights   01/04/20 0920 01/04/20 1305 01/05/20 0628  Weight: 65.3 kg 64.3 kg 65.1 kg    Exam  General: Well developed, chronically ill appearing  HEENT: NCAT, mucous membranes moist.   Cardiovascular: S1 S2 auscultated, RRR, +SEM  Respiratory: Clear to auscultation bilaterally  Abdomen: Soft, nontender, nondistended, + bowel sounds  Extremities: B/L BKA. R stump with scant drainage. Upper ext edema L>R  Neuro: AAOx1, appears confused, not able to answer questions  Psych: Cannot assess   Data Reviewed: I have personally reviewed following labs and imaging studies  CBC: Recent Labs  Lab 12/30/19 1819 01/02/20 0353 01/03/20 0305 01/04/20 0727 01/05/20 0758  WBC 13.3* 11.4* 9.6 8.6 8.8  NEUTROABS 10.1*  --   --   --   --   HGB 10.4* 8.0* 8.4* 7.6* 7.4*  HCT 34.2* 25.5* 25.9* 24.1* 22.6*  MCV 92.4 89.5 88.7 90.9 89.7  PLT 161 142* 167 169 951*   Basic Metabolic Panel: Recent Labs  Lab 01/02/20 0353 01/03/20 0305 01/03/20 2306 01/04/20 0727 01/05/20 8841  NA 143 144 146* 144 140  K 3.6 3.1* 3.0* 4.1 3.6  CL 116* 106 115* 111 104  CO2 8* 22 13* 16* 25  GLUCOSE 260* 204* 194* 240* 151*  BUN 97* 89* 92* 102* 41*  CREATININE 5.50* 5.35* 5.45* 6.11* 3.50*  CALCIUM 7.9* 7.3* 7.2* 7.7* 7.9*  MG 1.6* 1.6* 1.7 2.3 2.0  PHOS 9.0* 8.5* 8.0*  --   --    GFR: Estimated Creatinine Clearance: 19.1 mL/min (A) (by C-G formula based on SCr of 3.5 mg/dL (H)). Liver Function Tests: Recent Labs  Lab 01/01/20 1100 01/01/20 1100 01/02/20 0353 01/03/20 0305 01/03/20 2306 01/04/20 0727 01/05/20 0758  AST 16  --  14* 11*  --  9* 13*  ALT 13  --  14 12  --  13 13  ALKPHOS 50  --  52 59  --  56 58  BILITOT 0.9  --  1.0 0.9  --  0.7 0.9  PROT 4.6*  --  4.8* 4.7*  --  4.6* 4.5*  ALBUMIN 2.0*   < >  2.1* 2.0* 1.8* 1.8* 1.7*   < > = values in this interval not displayed.   No results for input(s): LIPASE, AMYLASE in the last 168 hours. No results for input(s): AMMONIA in the last 168 hours. Coagulation Profile: Recent Labs  Lab 01/04/20 0751  INR 1.2   Cardiac Enzymes: Recent Labs  Lab 01/01/20 1100  CKTOTAL 56   BNP (last 3 results) No results for input(s): PROBNP in the last 8760 hours. HbA1C: No results for input(s): HGBA1C in the last 72 hours. CBG: Recent Labs  Lab 01/03/20 2118 01/04/20 0836 01/04/20 1802 01/04/20 2119 01/05/20 0627  GLUCAP 167* 203* 161* 162* 159*   Lipid Profile: No results for input(s): CHOL, HDL, LDLCALC, TRIG, CHOLHDL, LDLDIRECT in the last 72 hours. Thyroid Function Tests: No results for input(s): TSH, T4TOTAL, FREET4, T3FREE, THYROIDAB in the last 72 hours. Anemia Panel: Recent Labs    01/04/20 1443  FERRITIN 657*  TIBC 111*  IRON 90   Urine analysis:    Component Value Date/Time   COLORURINE YELLOW 12/31/2019 1420   APPEARANCEUR HAZY (A) 12/31/2019 1420   LABSPEC 1.010 12/31/2019 1420   PHURINE 5.0 12/31/2019 1420   GLUCOSEU 150 (A) 12/31/2019 1420   HGBUR MODERATE (A) 12/31/2019 1420   BILIRUBINUR NEGATIVE 12/31/2019 1420   KETONESUR NEGATIVE 12/31/2019 1420   PROTEINUR >=300 (A) 12/31/2019 1420   UROBILINOGEN 0.2 08/02/2014 1424   NITRITE NEGATIVE 12/31/2019 1420   LEUKOCYTESUR MODERATE (A) 12/31/2019 1420   Sepsis Labs: @LABRCNTIP (procalcitonin:4,lacticidven:4)  ) Recent Results (from the past 240 hour(s))  SARS Coronavirus 2 by RT PCR (hospital order, performed in [place] hospital lab) Nasopharyngeal Nasopharyngeal Swab     Status: None   Collection Time: 12/31/19 11:15 AM   Specimen: Nasopharyngeal Swab  Result Value Ref Range Status   SARS Coronavirus 2 NEGATIVE NEGATIVE Final    Comment: (NOTE) SARS-CoV-2 target nucleic acids are NOT DETECTED.  The SARS-CoV-2 RNA is generally detectable in upper and  lower respiratory specimens during the acute phase of infection. The lowest concentration of SARS-CoV-2 viral copies this assay can detect is 250 copies / mL. A negative result does not preclude SARS-CoV-2 infection and should not be used as the sole basis for treatment or other patient management decisions.  A negative result may occur with improper specimen collection / handling, submission of specimen other than nasopharyngeal swab, presence of viral mutation(s) within the  areas targeted by this assay, and inadequate number of viral copies (<250 copies / mL). A negative result must be combined with clinical observations, patient history, and epidemiological information.  Fact Sheet for Patients:   StrictlyIdeas.no  Fact Sheet for Healthcare Providers: BankingDealers.co.za  This test is not yet approved or  cleared by the [place] FDA and has been authorized for detection and/or diagnosis of SARS-CoV-2 by FDA under an Emergency Use Authorization (EUA).  This EUA will remain in effect (meaning this test can be used) for the duration of the COVID-19 declaration under Section 564(b)(1) of the Act, 21 U.S.C. section 360bbb-3(b)(1), unless the authorization is terminated or revoked sooner.  Performed at [facility], [place] [address] [place], [place] [address]       Radiology Studies: DG Chest 1 View  Result Date: 01/04/2020 CLINICAL DATA:  Pulmonary edema EXAM: CHEST  1 VIEW COMPARISON:  10/12/2019 FINDINGS: The heart size and mediastinal contours are within normal limits. Both lungs are clear. The visualized skeletal structures are unremarkable. IMPRESSION: No active disease. Electronically Signed   By: Ulyses Jarred M.D.   On: 01/04/2020 01:21   MR TIBIA FIBULA RIGHT WO CONTRAST  Result Date: 01/04/2020 CLINICAL DATA:  Possible osteomyelitis. EXAM: MRI OF LOWER RIGHT EXTREMITY WITHOUT CONTRAST TECHNIQUE: Multiplanar MR  images of the remaining tibia/fibula were attempted. The has a below the knee amputation. There were cooperation issues and patient motion related issues because of this the distal stump and part of the tibia and fibula were not included. The technologist reports that this was the best exam the could be achieved due to cooperation related issues. COMPARISON:  CT scan 12/31/2019 FINDINGS: Despite efforts by the technologist and patient, motion artifact is present on today's exam and could not be eliminated. This reduces exam sensitivity and specificity. Bones/Joint/Cartilage No findings of osteomyelitis in the distal femur, in the patella, or in the included proximal 6 cm of the tibia. Due to cooperation related issues, we did not visualize about 2 cm of the distal most tibia or the overlying stump. A knee effusion is present. No compelling degree of synovitis to suggest that this is from septic joint, and no abnormal marrow signal along the joint surfaces. Ligaments The anterior and posterior cruciate ligaments appear intact. Muscles and Tendons No unexpected findings. Soft tissues Mild diffuse subcutaneous edema. IMPRESSION: 1. No findings of osteomyelitis in the distal femur, patella, or in the included proximal 6 cm of the tibia. Please note that the patient cooperation related issues, the distal 2 cm of the tibia/fibula, and the overlying stump, were not included in imaging. This includes the hyperdense collection previously seen along the stump which was thought to potentially be a hematoma on the prior CT from 12/31/2019. 2. No drainable abscess. 3. Small knee effusion. I am skeptical of septic joint given the lack of synovitis. 4. Despite efforts by the technologist and patient, motion artifact is present on today's exam and could not be eliminated. This reduces exam sensitivity and specificity. Electronically Signed   By: Van Clines M.D.   On: 01/04/2020 20:16     Scheduled Meds: . aspirin EC   81 mg Oral Daily  . atorvastatin  10 mg Oral Q M,W,F  . calcium acetate  2,001 mg Oral TID WC  . Chlorhexidine Gluconate Cloth  6 each Topical Q0600  . cholecalciferol  4,000 Units Oral Daily  . [START ON 01/06/2020] darbepoetin (ARANESP) injection - DIALYSIS  60 mcg Intravenous Q  Thu-HD  . insulin aspart  0-9 Units Subcutaneous TID WC  . lipase/protease/amylase  12,000 Units Oral TID AC  . magnesium chloride  2 tablet Oral BID  . mycophenolate  360 mg Oral BID  . pantoprazole  40 mg Oral Daily  . predniSONE  5 mg Oral Q breakfast  . sertraline  50 mg Oral Daily  . tacrolimus  2 mg Oral BID  . tamsulosin  0.4 mg Oral QPC supper   Continuous Infusions: . sodium chloride    . sodium chloride    . amiodarone 30 mg/hr (01/05/20 0813)  . ceFEPime (MAXIPIME) IV 2 g (01/04/20 1715)  . heparin 1,400 Units/hr (01/05/20 0800)  . vancomycin 1,000 mg (01/02/20 1356)     LOS: 5 days   Time Spent in minutes   45 minutes  Theta Leaf D.O. on 01/05/2020 at 10:35 AM  Between 7am to 7pm - Please see pager noted on [email or web address]  After 7pm go to [email or web address]  And look for the night coverage person covering for me after hours  [facility] Office  [phone]

## 2020-01-05 NOTE — Plan of Care (Signed)

## 2020-01-06 DIAGNOSIS — T8781 Dehiscence of amputation stump: Secondary | ICD-10-CM

## 2020-01-06 DIAGNOSIS — T8740 Infection of amputation stump, unspecified extremity: Secondary | ICD-10-CM

## 2020-01-06 DIAGNOSIS — A419 Sepsis, unspecified organism: Secondary | ICD-10-CM | POA: Diagnosis not present

## 2020-01-06 DIAGNOSIS — M86261 Subacute osteomyelitis, right tibia and fibula: Secondary | ICD-10-CM

## 2020-01-06 DIAGNOSIS — I480 Paroxysmal atrial fibrillation: Secondary | ICD-10-CM | POA: Diagnosis not present

## 2020-01-06 LAB — HEPARIN LEVEL (UNFRACTIONATED): Heparin Unfractionated: 0.54 IU/mL (ref 0.30–0.70)

## 2020-01-06 LAB — CBC
HCT: 26.1 % — ABNORMAL LOW (ref 39.0–52.0)
Hemoglobin: 8.2 g/dL — ABNORMAL LOW (ref 13.0–17.0)
MCH: 28.6 pg (ref 26.0–34.0)
MCHC: 31.4 g/dL (ref 30.0–36.0)
MCV: 90.9 fL (ref 80.0–100.0)
Platelets: 148 10*3/uL — ABNORMAL LOW (ref 150–400)
RBC: 2.87 MIL/uL — ABNORMAL LOW (ref 4.22–5.81)
RDW: 15.7 % — ABNORMAL HIGH (ref 11.5–15.5)
WBC: 9.4 10*3/uL (ref 4.0–10.5)
nRBC: 0 % (ref 0.0–0.2)

## 2020-01-06 LAB — RENAL FUNCTION PANEL
Albumin: 1.8 g/dL — ABNORMAL LOW (ref 3.5–5.0)
Anion gap: 14 (ref 5–15)
BUN: 49 mg/dL — ABNORMAL HIGH (ref 8–23)
CO2: 21 mmol/L — ABNORMAL LOW (ref 22–32)
Calcium: 8.1 mg/dL — ABNORMAL LOW (ref 8.9–10.3)
Chloride: 105 mmol/L (ref 98–111)
Creatinine, Ser: 3.91 mg/dL — ABNORMAL HIGH (ref 0.61–1.24)
GFR calc Af Amer: 17 mL/min — ABNORMAL LOW (ref >60)
GFR calc non Af Amer: 15 mL/min — ABNORMAL LOW (ref >60)
Glucose, Bld: 130 mg/dL — ABNORMAL HIGH (ref 70–99)
Phosphorus: 5.9 mg/dL — ABNORMAL HIGH (ref 2.5–4.6)
Potassium: 4.1 mmol/L (ref 3.5–5.1)
Sodium: 140 mmol/L (ref 135–145)

## 2020-01-06 LAB — GLUCOSE, CAPILLARY
Glucose-Capillary: 126 mg/dL — ABNORMAL HIGH (ref 70–99)
Glucose-Capillary: 162 mg/dL — ABNORMAL HIGH (ref 70–99)
Glucose-Capillary: 138 mg/dL — ABNORMAL HIGH (ref 70–99)
Glucose-Capillary: 138 mg/dL — ABNORMAL HIGH (ref 70–99)

## 2020-01-06 MED ORDER — PHENYLEPHRINE HCL 0.5 % NA SOLN
1.0000 [drp] | Freq: Four times a day (QID) | NASAL | Status: DC | PRN
  Filled 2020-01-06: qty 15

## 2020-01-06 MED ORDER — DARBEPOETIN ALFA 60 MCG/0.3ML IJ SOSY
PREFILLED_SYRINGE | INTRAMUSCULAR | Status: AC
  Filled 2020-01-06: qty 0.3

## 2020-01-06 MED ORDER — AMLODIPINE BESYLATE 5 MG PO TABS
5.0000 mg | ORAL_TABLET | Freq: Every day | ORAL | Status: DC
  Administered 2020-01-06 – 2020-01-18 (×12): 5 mg via ORAL
  Filled 2020-01-06 (×13): qty 1

## 2020-01-06 MED ORDER — AMIODARONE HCL 200 MG PO TABS
200.0000 mg | ORAL_TABLET | Freq: Every day | ORAL | Status: DC
  Administered 2020-01-13 – 2020-01-18 (×6): 200 mg via ORAL
  Filled 2020-01-06 (×6): qty 1

## 2020-01-06 MED ORDER — AMIODARONE HCL 200 MG PO TABS
400.0000 mg | ORAL_TABLET | Freq: Two times a day (BID) | ORAL | Status: AC
  Administered 2020-01-06 – 2020-01-12 (×14): 400 mg via ORAL
  Filled 2020-01-06 (×14): qty 2

## 2020-01-06 NOTE — Progress Notes (Signed)
Cardiology Progress Note  Patient ID: Paul [name] MRN: [id number] DOB: [birth year] Date of Encounter: 01/06/2020  Primary Cardiologist: Evalina Field, MD  Subjective   Chief Complaint: Confused improved. Still not back to baseline.   HPI: Still not back to baseline. In NSR.   ROS:  All other ROS reviewed and negative. Pertinent positives noted in the HPI.     Inpatient Medications  Scheduled Meds: . amiodarone  400 mg Oral BID   Followed by  . [START ON 01/13/2020] amiodarone  200 mg Oral Daily  . amLODipine  5 mg Oral Daily  . aspirin EC  81 mg Oral Daily  . atorvastatin  10 mg Oral Q M,W,F  . calcium acetate  2,001 mg Oral TID WC  . Chlorhexidine Gluconate Cloth  6 each Topical Q0600  . cholecalciferol  4,000 Units Oral Daily  . darbepoetin (ARANESP) injection - DIALYSIS  60 mcg Intravenous Q Thu-HD  . insulin aspart  0-9 Units Subcutaneous TID WC  . lipase/protease/amylase  12,000 Units Oral TID AC  . magnesium chloride  2 tablet Oral BID  . pantoprazole  40 mg Oral Daily  . predniSONE  5 mg Oral Q breakfast  . sertraline  50 mg Oral Daily  . tamsulosin  0.4 mg Oral QPC supper   Continuous Infusions: . sodium chloride    . sodium chloride    . ceFEPime (MAXIPIME) IV 2 g (01/05/20 1504)  . heparin 1,400 Units/hr (01/06/20 0322)  . mycophenolate (CELLCEPT) IV 500 mg (01/06/20 0630)  . tacrolimus (PROGRAF) IV 0.01 mg/kg/day (01/05/20 1809)  . vancomycin     PRN Meds: sodium chloride, sodium chloride, acetaminophen **OR** acetaminophen, alteplase, heparin, hydrALAZINE, HYDROmorphone (DILAUDID) injection, lidocaine (PF), lidocaine-prilocaine, ondansetron (ZOFRAN) IV, pentafluoroprop-tetrafluoroeth   Vital Signs   Vitals:   01/05/20 1930 01/06/20 0008 01/06/20 0427 01/06/20 0812  BP: 128/62 (!) 161/71 (!) 150/67   Pulse: (!) 57 (!) 59 65   Resp: 17 16 14    Temp: 97.9 F (36.6 C) 97.8 F (36.6 C) 98.3 F (36.8 C) 98.5 F (36.9 C)  TempSrc: Oral Oral Oral Oral   SpO2: 100% 100% 97% 100%  Weight:      Height:        Intake/Output Summary (Last 24 hours) at 01/06/2020 0838 Last data filed at 01/06/2020 0645 Gross per 24 hour  Intake 728.96 ml  Output 350 ml  Net 378.96 ml   Last 3 Weights 01/05/2020 01/04/2020 01/04/2020  Weight (lbs) 143 lb 8.3 oz 141 lb 12.1 oz 143 lb 15.4 oz  Weight (kg) 65.1 kg 64.3 kg 65.3 kg      Telemetry  Overnight telemetry shows SR 70s, which I personally reviewed.   ECG  The most recent ECG shows SR 73, which I personally reviewed.   Physical Exam   Vitals:   01/05/20 1930 01/06/20 0008 01/06/20 0427 01/06/20 0812  BP: 128/62 (!) 161/71 (!) 150/67   Pulse: (!) 57 (!) 59 65   Resp: 17 16 14    Temp: 97.9 F (36.6 C) 97.8 F (36.6 C) 98.3 F (36.8 C) 98.5 F (36.9 C)  TempSrc: Oral Oral Oral Oral  SpO2: 100% 100% 97% 100%  Weight:      Height:         Intake/Output Summary (Last 24 hours) at 01/06/2020 0838 Last data filed at 01/06/2020 0645 Gross per 24 hour  Intake 728.96 ml  Output 350 ml  Net 378.96 ml    Last 3 Weights  01/05/2020 01/04/2020 01/04/2020  Weight (lbs) 143 lb 8.3 oz 141 lb 12.1 oz 143 lb 15.4 oz  Weight (kg) 65.1 kg 64.3 kg 65.3 kg    Body mass index is 19.46 kg/m.   General: Well nourished, well developed, in no acute distress Head: Atraumatic, normal size  Eyes: PEERLA, EOMI  Neck: Supple, no JVD Endocrine: No thryomegaly Cardiac: Normal S1, S2; 2/6 SEM Lungs: Clear to auscultation bilaterally, no wheezing, rhonchi or rales  Abd: Soft, nontender, no hepatomegaly  Ext: s/p bilateral BKA Musculoskeletal: No deformities, BUE and BLE strength normal and equal Skin: Warm and dry, no rashes   Neuro: alert, awake, oriented to person, place, and time.   Labs  High Sensitivity Troponin:   Recent Labs  Lab 01/03/20 2306 01/04/20 0727  TROPONINIHS 55* 56*     Cardiac EnzymesNo results for input(s): TROPONINI in the last 168 hours. No results for input(s): TROPIPOC in the last  168 hours.  Chemistry Recent Labs  Lab 01/03/20 0305 01/03/20 2306 01/04/20 0727 01/05/20 0758 01/06/20 0054  NA 144   < > 144 140 140  K 3.1*   < > 4.1 3.6 4.1  CL 106   < > 111 104 105  CO2 22   < > 16* 25 21*  GLUCOSE 204*   < > 240* 151* 130*  BUN 89*   < > 102* 41* 49*  CREATININE 5.35*   < > 6.11* 3.50* 3.91*  CALCIUM 7.3*   < > 7.7* 7.9* 8.1*  PROT 4.7*  --  4.6* 4.5*  --   ALBUMIN 2.0*   < > 1.8* 1.7* 1.8*  AST 11*  --  9* 13*  --   ALT 12  --  13 13  --   ALKPHOS 59  --  56 58  --   BILITOT 0.9  --  0.7 0.9  --   GFRNONAA 10*   < > 9* 17* 15*  GFRAA 12*   < > 10* 20* 17*  ANIONGAP 16*   < > 17* 11 14   < > = values in this interval not displayed.    Hematology Recent Labs  Lab 01/04/20 0727 01/05/20 0758 01/06/20 0054  WBC 8.6 8.8 9.4  RBC 2.65* 2.52* 2.87*  HGB 7.6* 7.4* 8.2*  HCT 24.1* 22.6* 26.1*  MCV 90.9 89.7 90.9  MCH 28.7 29.4 28.6  MCHC 31.5 32.7 31.4  RDW 16.1* 15.7* 15.7*  PLT 169 140* 148*   BNPNo results for input(s): BNP, PROBNP in the last 168 hours.  DDimer No results for input(s): DDIMER in the last 168 hours.   Radiology  MR TIBIA FIBULA RIGHT WO CONTRAST  Result Date: 01/04/2020 CLINICAL DATA:  Possible osteomyelitis. EXAM: MRI OF LOWER RIGHT EXTREMITY WITHOUT CONTRAST TECHNIQUE: Multiplanar MR images of the remaining tibia/fibula were attempted. The has a below the knee amputation. There were cooperation issues and patient motion related issues because of this the distal stump and part of the tibia and fibula were not included. The technologist reports that this was the best exam the could be achieved due to cooperation related issues. COMPARISON:  CT scan 12/31/2019 FINDINGS: Despite efforts by the technologist and patient, motion artifact is present on today's exam and could not be eliminated. This reduces exam sensitivity and specificity. Bones/Joint/Cartilage No findings of osteomyelitis in the distal femur, in the patella, or in the  included proximal 6 cm of the tibia. Due to cooperation related issues, we did not visualize  about 2 cm of the distal most tibia or the overlying stump. A knee effusion is present. No compelling degree of synovitis to suggest that this is from septic joint, and no abnormal marrow signal along the joint surfaces. Ligaments The anterior and posterior cruciate ligaments appear intact. Muscles and Tendons No unexpected findings. Soft tissues Mild diffuse subcutaneous edema. IMPRESSION: 1. No findings of osteomyelitis in the distal femur, patella, or in the included proximal 6 cm of the tibia. Please note that the patient cooperation related issues, the distal 2 cm of the tibia/fibula, and the overlying stump, were not included in imaging. This includes the hyperdense collection previously seen along the stump which was thought to potentially be a hematoma on the prior CT from 12/31/2019. 2. No drainable abscess. 3. Small knee effusion. I am skeptical of septic joint given the lack of synovitis. 4. Despite efforts by the technologist and patient, motion artifact is present on today's exam and could not be eliminated. This reduces exam sensitivity and specificity. Electronically Signed   By: Van Clines M.D.   On: 01/04/2020 20:16    Cardiac Studies  TTE 01/28/2019  1. The left ventricle has normal systolic function with an ejection  fraction of 60-65%. The cavity size was normal. Left ventricular diastolic  Doppler parameters are consistent with impaired relaxation.  2. The right ventricle has normal systolic function. The cavity was  normal. There is no increase in right ventricular wall thickness.  3. The aortic valve is tricuspid. Mild thickening of the aortic valve.  Mild calcification of the aortic valve. Mild stenosis of the aortic valve.  4. Peak and mean gradients through the AV are 23 and 12 mm Hg  respectively consistent with mild AS. Since echo from Jan 2020, gradients  are mildly  increased.   Patient Profile  Paul [name] is a 64 y.o. male with ESRD s/p kidney transplant with failed graft, pAF, DM, PAD s/p BKA admitted 12/30/2019 for R leg osteomyelitis. Cardiology consulted for Afib with RVR.   Assessment & Plan   1. Afib with RVR -Developed A. fib with RVR on 01/03/2020.  This is secondary to his infection and uremia.  He is now being treated with antibiotics and on hemodialysis.  He converted back to sinus rhythm with amiodarone bolus. -He was on amiodarone IV as he was not able to tolerate p.o. -He is now tolerating p.o.  I have ordered a 1 month course of amiodarone which will include 400 mg twice daily for 7 days and then he will continue with 200 mg daily for 21 days.  He will stop after 1 month of therapy.  This is not a long-term option for him.  This will help maintain sinus rhythm while he is acutely ill. -Heparin drip for now.  Noticed to have bleeding from the nares.  Would hold if this is an issue.  Restart Eliquis when able.  There is no rush to do this.  [facility] will sign off.   Medication Recommendations: Amiodarone p.o. 400 mg twice daily for 7 days followed by 200 mg daily for 21 days and stop.  Resume home anticoagulation with all surgical procedures are completed and there is no increased risk for bleeding. Other recommendations (labs, testing, etc): None Follow up as an outpatient: He may follow-up with me in 4-6 weeks post discharge  For questions or updates, please contact [place] Please consult [email or web address] for contact info under   Time Spent with Patient: I have  spent a total of 25 minutes with patient reviewing hospital notes, telemetry, EKGs, labs and examining the patient as well as establishing an assessment and plan that was discussed with the patient.  > 50% of time was spent in direct patient care.    Signed, Addison Naegeli. Audie Box, [place]  01/06/2020 8:38 AM

## 2020-01-06 NOTE — Progress Notes (Signed)
[facility] for Heparin Indication: atrial fibrillation  Allergies  Allergen Reactions  . Metformin And Related Other (See Comments)    Has kidney problems    Patient Measurements: Height: 6' (182.9 cm) Weight: 65.1 kg (143 lb 8.3 oz) IBW/kg (Calculated) : 77.6  Vital Signs: Temp: 98.5 F (36.9 C) (08/19 0812) Temp Source: Oral (08/19 0812) BP: 150/67 (08/19 0427) Pulse Rate: 65 (08/19 0427)  Labs: Recent Labs    01/03/20 2306 01/03/20 2306 01/04/20 0727 01/04/20 0727 01/04/20 0751 01/04/20 0751 01/04/20 2107 01/05/20 0758 01/06/20 0054  HGB  --   --  7.6*   < >  --   --   --  7.4* 8.2*  HCT  --   --  24.1*  --   --   --   --  22.6* 26.1*  PLT  --   --  169  --   --   --   --  140* 148*  APTT  --   --   --   --  32  --   --   --   --   LABPROT  --   --   --   --  14.7  --   --   --   --   INR  --   --   --   --  1.2  --   --   --   --   HEPARINUNFRC  --   --   --   --  <0.10*   < > 0.19* 0.41 0.54  CREATININE 5.45*   < > 6.11*  --   --   --   --  3.50* 3.91*  TROPONINIHS 55*  --  56*  --   --   --   --   --   --    < > = values in this interval not displayed.    Estimated Creatinine Clearance: 17.1 mL/min (A) (by C-G formula based on SCr of 3.91 mg/dL (H)).   Medical History: Past Medical History:  Diagnosis Date  . A-fib ([place])   . Anemia    esrd  . Calciphylaxis 05/2013   lower ext  . Cancer ([place])    hx duodenal adenoCa 2002, resected  . Closed left arm fracture 1967  . Corneal abrasion, left   . Depression   . Diabetes mellitus    Type 1 iddm x 11 yrs  . Diabetic neuropathy ([place])   . ESRD on hemodialysis ([place])    Pt has ESRD due to DM.  He had a L upper arm AVF prior to starting HD which was ligated due to L arm steal syndrome.  He started HD in Jan 2014 and did home HD.  The family couldn't cannulate the R arm AVF successfully so by mid 2014 they decided to switch to PD which was done late summer 2014.  In Jan  2015 he was admitted with FTT felt to be due to underdialysis on PD and PD was abandoned and he   . GERD (gastroesophageal reflux disease)   . Glomerulosclerosis, diabetic ([place])   . Heart murmur   . History of blood transfusion   . Hypertension    off of meds due to orthostatic hypotension  . Kidney transplant recipient   . Open wound of both legs with complication    Pt has had progressive wounds of both LE's including gangrene of the toes and patchy necrosis of the calves.  He has been treated at [facility] by Dr Jerline Pain with hyperbaric O2 5d per week.  He is getting Na Thiosulfate with HD for suspected calciphylaxis. Pt says doctor's aren't sure if these ulcers were diabetic ulcers or calciphylaxis.  He underwent bilat BKA on 07/30/13.  He says that the woun    Medications:  No current facility-administered medications on file prior to encounter.   Current Outpatient Medications on File Prior to Encounter  Medication Sig Dispense Refill  . acetaminophen (TYLENOL) 500 MG tablet Take 500 mg by mouth every 6 (six) hours as needed for mild pain or moderate pain.    [name] amLODipine (NORVASC) 10 MG tablet Take 10 mg by mouth daily.    [name] aspirin 81 MG chewable tablet Chew 81 mg by mouth daily.    [name] atorvastatin (LIPITOR) 10 MG tablet Take 1 tablet (10 mg total) by mouth daily. (Patient taking differently: Take 10 mg by mouth every Monday, Wednesday, and Friday. ) 30 tablet 1  . calcitRIOL (ROCALTROL) 0.25 MCG capsule Take 0.25 mcg by mouth daily.     . calcium acetate (PHOSLO) 667 MG capsule Take 1,334 mg by mouth 3 (three) times daily with meals.    . [employer] D3 50 MCG (2000 UT) CAPS Take 4,000 Units by mouth daily.    [name] ELIQUIS 5 MG TABS tablet TAKE 1 TABLET BY MOUTH TWICE A DAY (Patient taking differently: Take 5 mg by mouth 2 (two) times daily. ) 60 tablet 4  . feeding supplement, GLUCERNA SHAKE, (GLUCERNA SHAKE) LIQD Take 237 mLs by mouth 3 (three) times daily between meals.    [name] HUMALOG KWIKPEN  100 UNIT/ML KwikPen Inject 4-8 Units into the skin daily. Sliding Scale    . hydrALAZINE (APRESOLINE) 100 MG tablet Take 100 mg by mouth 2 (two) times daily. Afternoon, bedtime    . Insulin Glargine (BASAGLAR KWIKPEN) 100 UNIT/ML SOPN Inject 10 Units into the skin daily.     . magnesium chloride (SLOW-MAG) 64 MG TBEC SR tablet Take 2 tablets by mouth 2 (two) times daily. Take at least 2 hours separate from tacrolimus.    . mycophenolate (MYFORTIC) 180 MG EC tablet Take 360 mg by mouth 2 (two) times daily.     [name] omeprazole (PRILOSEC) 40 MG capsule Take 40 mg by mouth at bedtime.     . predniSONE (DELTASONE) 5 MG tablet Take 5 mg by mouth daily with breakfast.     . sertraline (ZOLOFT) 50 MG tablet Take 50 mg by mouth at bedtime.     . sodium bicarbonate 650 MG tablet Take 1,950 mg by mouth 4 (four) times daily.    . tacrolimus (PROGRAF) 1 MG capsule Take 2-3 mg by mouth See admin instructions. Take 3 mg by mouth in the morning at 9 AM and 2 mg in the evening at 9 PM    . tamsulosin (FLOMAX) 0.4 MG CAPS capsule Take 0.4 mg by mouth daily after supper.        Assessment: 30 yoM on apixaban PTA for hx AFib transitioned to IV heparin.  Heparin level 0.5 this am at goal  on 1400 units/hr. Hgb improved at 8.2 pltc stable at 140.  Slight nose bleed noted  - will drop heparin drip slightly  PAF s/p Afib RVR>Amiodarone IV>po today with improved confusion per MD> now in SR - plan for 3 weeks during acute illness not long term medication Amiodarone can increase tacrolimus level - may need to decrease tacrolimus doses while  on amiodarone - Planning amputation tomorrow   Goal of Therapy:  Heparin level 0.3-0.7 units/ml Monitor platelets by anticoagulation protocol: Yes   Plan:  Decrease  heparin  1350 units/h Recheck heparin level with morning labs Change heparin to apixaban when able post op    Bonnita Nasuti Pharm.D. CPP, BCPS Clinical Pharmacist [phone] 01/06/2020 9:03 AM

## 2020-01-06 NOTE — Progress Notes (Signed)
[facility] Progress Note   64 y.o.maleDDRT 2015 CKD4, AoCKD Afib DM HTN b/l BKA h/o duodenal adenocarcinoma s/p resection. 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 Currently on Myfortic, Tac and prednisone. Baseline Cr is2.8-3.4(as recently as 10/12/2019) but appears to be slowly worsening as an outpatient. Patient is here with leg pain and states that the pain to the right leg stump has worsened over the past few days but denies any fever.  Now with subsequent renal failure  Assessment/ Plan:   1. AKI on CKD4 now ESRD: BL cr 2.8-3.4 s/p DDRT with DGF complicated by ACR and antibody mediated rejection requiring IVIG as recently as 2018.  Unfortunately with minimal urine output and creatinine not improving.  Given the patient's failure to improve and significant baseline kidney dysfunction I think he is end-stage renal disease dependent on dialysis. 1. Continue dialysis TTS schedule-Will have dialysis today 2. Continue tacrolimus, mycophenolate, prednisone-tac tapered to 2/2 on 8/18 3. Continue with clip process -> likely to go back to previous center when medically ready 4. Patient has functional dialysis access 2. NAGMA: nearly resolved. Continue HD as above 2. DM - poorly controlled. Per primary. 3. HTN -BP fluctuating. Start norvasc 5mg  daily 4. Pancreatic insufficiency -continue Creon 5. Hyperphosphatemia: Continue PhosLo when eating.  Diet restarted today 6. Anemia: Hemoglobin 8.2.  Ferritin 657 and iron sat 81.  Iron not an option. aranesp 26mcg weekly starting today  Subjective:   Patient is much more communicative today.  Denies any complaints.  Able to answer questions without any issue.  Trying diet again today.   Objective:   BP (!) 150/67 (BP Location: Left Arm)   Pulse 65   Temp 98.5 F (36.9 C) (Oral)   Resp 14   Ht 6' (1.829 m)   Wt 65.1 kg   SpO2 100%   BMI 19.46 kg/m   Intake/Output Summary (Last 24 hours) at 01/06/2020  0932 Last data filed at 01/06/2020 0645 Gross per 24 hour  Intake 728.96 ml  Output 350 ml  Net 378.96 ml   Weight change:   Physical Exam: GEN: NAD, lying in bed HEENT: No nasal discharge, moist mucous membranes LUNGS: Bilateral chest rise, no increased work of breathing CV: Normal rate, no upper extremity edema ABD: Positive bowel sounds, no distention EXT: B/L BKA, warm ACCESS: Rt Cimino with good bruit  Imaging: MR TIBIA FIBULA RIGHT WO CONTRAST  Result Date: 01/04/2020 CLINICAL DATA:  Possible osteomyelitis. EXAM: MRI OF LOWER RIGHT EXTREMITY WITHOUT CONTRAST TECHNIQUE: Multiplanar MR images of the remaining tibia/fibula were attempted. The has a below the knee amputation. There were cooperation issues and patient motion related issues because of this the distal stump and part of the tibia and fibula were not included. The technologist reports that this was the best exam the could be achieved due to cooperation related issues. COMPARISON:  CT scan 12/31/2019 FINDINGS: Despite efforts by the technologist and patient, motion artifact is present on today's exam and could not be eliminated. This reduces exam sensitivity and specificity. Bones/Joint/Cartilage No findings of osteomyelitis in the distal femur, in the patella, or in the included proximal 6 cm of the tibia. Due to cooperation related issues, we did not visualize about 2 cm of the distal most tibia or the overlying stump. A knee effusion is present. No compelling degree of synovitis to suggest that this is from septic joint, and no abnormal marrow signal along the joint surfaces. Ligaments The anterior and posterior cruciate ligaments appear  intact. Muscles and Tendons No unexpected findings. Soft tissues Mild diffuse subcutaneous edema. IMPRESSION: 1. No findings of osteomyelitis in the distal femur, patella, or in the included proximal 6 cm of the tibia. Please note that the patient cooperation related issues, the distal 2 cm of the  tibia/fibula, and the overlying stump, were not included in imaging. This includes the hyperdense collection previously seen along the stump which was thought to potentially be a hematoma on the prior CT from 12/31/2019. 2. No drainable abscess. 3. Small knee effusion. I am skeptical of septic joint given the lack of synovitis. 4. Despite efforts by the technologist and patient, motion artifact is present on today's exam and could not be eliminated. This reduces exam sensitivity and specificity. Electronically Signed   By: Van Clines M.D.   On: 01/04/2020 20:16    Labs: BMET Recent Labs  Lab 01/01/20 1100 01/02/20 0353 01/03/20 0305 01/03/20 2306 01/04/20 0727 01/05/20 0758 01/06/20 0054  NA 143 143 144 146* 144 140 140  K 2.6* 3.6 3.1* 3.0* 4.1 3.6 4.1  CL 116* 116* 106 115* 111 104 105  CO2 10* 8* 22 13* 16* 25 21*  GLUCOSE 186* 260* 204* 194* 240* 151* 130*  BUN 93* 97* 89* 92* 102* 41* 49*  CREATININE 5.29* 5.50* 5.35* 5.45* 6.11* 3.50* 3.91*  CALCIUM 7.7* 7.9* 7.3* 7.2* 7.7* 7.9* 8.1*  PHOS  --  9.0* 8.5* 8.0*  --   --  5.9*   CBC Recent Labs  Lab 12/30/19 1819 01/02/20 0353 01/03/20 0305 01/04/20 0727 01/05/20 0758 01/06/20 0054  WBC 13.3*   < > 9.6 8.6 8.8 9.4  NEUTROABS 10.1*  --   --   --   --   --   HGB 10.4*   < > 8.4* 7.6* 7.4* 8.2*  HCT 34.2*   < > 25.9* 24.1* 22.6* 26.1*  MCV 92.4   < > 88.7 90.9 89.7 90.9  PLT 161   < > 167 169 140* 148*   < > = values in this interval not displayed.    Medications:    . amiodarone  400 mg Oral BID   Followed by  . [START ON 01/13/2020] amiodarone  200 mg Oral Daily  . amLODipine  5 mg Oral Daily  . aspirin EC  81 mg Oral Daily  . atorvastatin  10 mg Oral Q M,W,F  . calcium acetate  2,001 mg Oral TID WC  . Chlorhexidine Gluconate Cloth  6 each Topical Q0600  . cholecalciferol  4,000 Units Oral Daily  . darbepoetin (ARANESP) injection - DIALYSIS  60 mcg Intravenous Q Thu-HD  . insulin aspart  0-9 Units  Subcutaneous TID WC  . lipase/protease/amylase  12,000 Units Oral TID AC  . magnesium chloride  2 tablet Oral BID  . pantoprazole  40 mg Oral Daily  . predniSONE  5 mg Oral Q breakfast  . sertraline  50 mg Oral Daily  . tamsulosin  0.4 mg Oral QPC supper      Reesa Chew  01/06/2020, 9:32 AM

## 2020-01-06 NOTE — Consult Note (Signed)
ORTHOPAEDIC CONSULTATION  REQUESTING PHYSICIAN: Cristal Ford, DO  Chief Complaint: Painful ulcer right below-knee amputation.  HPI: Paul [name] is a 64 y.o. male who presents with multiple medical problems status post renal transplant who has cellulitis blistering and draining ulceration over the right transtibial amputation.  Past Medical History:  Diagnosis Date  . A-fib ([place])   . Anemia    esrd  . Calciphylaxis 05/2013   lower ext  . Cancer ([place])    hx duodenal adenoCa 2002, resected  . Closed left arm fracture 1967  . Corneal abrasion, left   . Depression   . Diabetes mellitus    Type 1 iddm x 11 yrs  . Diabetic neuropathy ([place])   . ESRD on hemodialysis ([place])    Pt has ESRD due to DM.  He had a L upper arm AVF prior to starting HD which was ligated due to L arm steal syndrome.  He started HD in Jan 2014 and did home HD.  The family couldn't cannulate the R arm AVF successfully so by mid 2014 they decided to switch to PD which was done late summer 2014.  In Jan 2015 he was admitted with FTT felt to be due to underdialysis on PD and PD was abandoned and he   . GERD (gastroesophageal reflux disease)   . Glomerulosclerosis, diabetic ([place])   . Heart murmur   . History of blood transfusion   . Hypertension    off of meds due to orthostatic hypotension  . Kidney transplant recipient   . Open wound of both legs with complication    Pt has had progressive wounds of both LE's including gangrene of the toes and patchy necrosis of the calves.  He has been treated at [facility] by Dr Jerline Pain with hyperbaric O2 5d per week.  He is getting Na Thiosulfate with HD for suspected calciphylaxis. Pt says doctor's aren't sure if these ulcers were diabetic ulcers or calciphylaxis.  He underwent bilat BKA on 07/30/13.  He says that the woun   Past Surgical History:  Procedure Laterality Date  . AMPUTATION Bilateral 07/30/2013   Procedure: AMPUTATION BELOW KNEE;  Surgeon: Newt Minion,  MD;  Location: [facility];  Service: Orthopedics;  Laterality: Bilateral;  Bilateral Below Knee Amputations  . AMPUTATION Bilateral 09/03/2013   Procedure: AMPUTATION BELOW KNEE;  Surgeon: Newt Minion, MD;  Location: [facility];  Service: Orthopedics;  Laterality: Bilateral;  Bilateral Below Knee Amputation Revision  . AV FISTULA PLACEMENT  06/07/2011   Procedure: ARTERIOVENOUS (AV) FISTULA CREATION;  Surgeon: Angelia Mould, MD;  Location: [facility];  Service: Vascular;  Laterality: Left;  . AV FISTULA PLACEMENT  06/18/2012   Procedure: ARTERIOVENOUS (AV) FISTULA CREATION;  Surgeon: Mal Misty, MD;  Location: [place];  Service: Vascular;  Laterality: Right;  [place]  . BILIARY DIVERSION, EXTERNAL  2002  . BONE MARROW BIOPSY  2012  . CAPD INSERTION N/A 01/14/2013   Procedure: LAPAROSCOPIC INSERTION CONTINUOUS AMBULATORY PERITONEAL DIALYSIS  (CAPD) CATHETER;  Surgeon: Adin Hector, MD;  Location: [facility];  Service: General;  Laterality: N/A;  . CAPD REMOVAL N/A 07/09/2013   Procedure: CONTINUOUS AMBULATORY PERITONEAL DIALYSIS  (CAPD) CATHETER REMOVAL;  Surgeon: Adin Hector, MD;  Location: [facility];  Service: General;  Laterality: N/A;  . COLONOSCOPY     Hx: of  . INSERTION OF DIALYSIS CATHETER  05/22/2012   Procedure: INSERTION OF DIALYSIS CATHETER;  Surgeon: Mal Misty, MD;  Location[facility];  Service: Vascular;  Laterality: N/A;  right internal jugular vein  . IR ANGIO INTRA EXTRACRAN SEL COM CAROTID INNOMINATE BILAT MOD SED  10/28/2018  . IR ANGIO VERTEBRAL SEL SUBCLAVIAN INNOMINATE UNI R MOD SED  10/28/2018  . IR ANGIO VERTEBRAL SEL VERTEBRAL UNI L MOD SED  10/28/2018  . LIGATION OF ARTERIOVENOUS  FISTULA  05/22/2012   Procedure: LIGATION OF ARTERIOVENOUS  FISTULA;  Surgeon: Mal Misty, MD;  Location: [facility];  Service: Vascular;  Laterality: Left;  Left brachio-cephalic fistula  . LOWER EXTREMITY ANGIOGRAM N/A 06/14/2013   Procedure: LOWER EXTREMITY ANGIOGRAM;  Surgeon: Angelia Mould, MD;   Location: [facility] CATH LAB;  Service: Cardiovascular;  Laterality: N/A;  . NEPHRECTOMY TRANSPLANTED ORGAN    . OTHER SURGICAL HISTORY     Cyst removed from back  . PORTACATH PLACEMENT  2002  . RADIOLOGY WITH ANESTHESIA N/A 10/28/2018   Procedure: IR WITH ANESTHESIA;  Surgeon: Radiologist, Medication, MD;  Location: [facility];  Service: Radiology;  Laterality: N/A;  . REMOVAL OF A DIALYSIS CATHETER    . RENAL BIOPSY, PERCUTANEOUS  2012  . TONSILLECTOMY    . VASCULAR SURGERY    . WHIPPLE PROCEDURE  2002   duodenal ca, Dr. Harlow Asa   Social History   Socioeconomic History  . Marital status: Married    Spouse name: Not on file  . Number of children: Not on file  . Years of education: Not on file  . Highest education level: Not on file  Occupational History  . Not on file  Tobacco Use  . Smoking status: Never Smoker  . Smokeless tobacco: Never Used  Vaping Use  . Vaping Use: Never used  Substance and Sexual Activity  . Alcohol use: No  . Drug use: No  . Sexual activity: Not on file  Other Topics Concern  . Not on file  Social History Narrative  . Not on file   Social Determinants of Health   Financial Resource Strain:   . Difficulty of Paying Living Expenses: Not on file  Food Insecurity:   . Worried About Charity fundraiser in the Last Year: Not on file  . Ran Out of Food in the Last Year: Not on file  Transportation Needs:   . Lack of Transportation (Medical): Not on file  . Lack of Transportation (Non-Medical): Not on file  Physical Activity:   . Days of Exercise per Week: Not on file  . Minutes of Exercise per Session: Not on file  Stress:   . Feeling of Stress : Not on file  Social Connections:   . Frequency of Communication with Friends and Family: Not on file  . Frequency of Social Gatherings with Friends and Family: Not on file  . Attends Religious Services: Not on file  . Active Member of Clubs or Organizations: Not on file  . Attends Archivist Meetings:  Not on file  . Marital Status: Not on file   Family History  Problem Relation Age of Onset  . Cancer Mother   . Cancer Father        prostate cancer  . Heart disease Maternal Grandfather   . Stroke Paternal Grandmother    - negative except otherwise stated in the family history section Allergies  Allergen Reactions  . Metformin And Related Other (See Comments)    Has kidney problems   Prior to Admission medications   Medication Sig Start Date End Date Taking? Authorizing Provider  acetaminophen (TYLENOL) 500 MG tablet Take 500 mg by mouth every 6 (six) hours as needed for mild pain or moderate pain.   Yes [provider]  amLODipine (NORVASC) 10 MG tablet Take 10 mg by mouth daily.   Yes [provider]  aspirin 81 MG chewable tablet Chew 81 mg by mouth daily.   Yes [provider]  atorvastatin (LIPITOR) 10 MG tablet Take 1 tablet (10 mg total) by mouth daily. Patient taking differently: Take 10 mg by mouth every Monday, Wednesday, and Friday.  08/13/13  Yes Angiulli, Lavon Paganini, PA-C  calcitRIOL (ROCALTROL) 0.25 MCG capsule Take 0.25 mcg by mouth daily.    Yes [provider]  calcium acetate (PHOSLO) 667 MG capsule Take 1,334 mg by mouth 3 (three) times daily with meals.   Yes [provider]  [employer] D3 50 MCG (2000 UT) CAPS Take 4,000 Units by mouth daily. 09/18/18  Yes [provider]  ELIQUIS 5 MG TABS tablet TAKE 1 TABLET BY MOUTH TWICE A DAY Patient taking differently: Take 5 mg by mouth 2 (two) times daily.  07/13/19  Yes O'Neal, Cassie Freer, MD  feeding supplement, GLUCERNA SHAKE, (GLUCERNA SHAKE) LIQD Take 237 mLs by mouth 3 (three) times daily between meals.   Yes [provider]  HUMALOG KWIKPEN 100 UNIT/ML KwikPen Inject 4-8 Units into the skin daily. Sliding Scale 01/11/19  Yes [provider]  hydrALAZINE (APRESOLINE) 100 MG tablet Take 100 mg by mouth 2 (two) times daily. Afternoon, bedtime 08/19/19  Yes  [provider]  Insulin Glargine (BASAGLAR KWIKPEN) 100 UNIT/ML SOPN Inject 10 Units into the skin daily.  01/11/19  Yes [provider]  magnesium chloride (SLOW-MAG) 64 MG TBEC SR tablet Take 2 tablets by mouth 2 (two) times daily. Take at least 2 hours separate from tacrolimus. 10/02/14  Yes [provider]  mycophenolate (MYFORTIC) 180 MG EC tablet Take 360 mg by mouth 2 (two) times daily.    Yes [provider]  omeprazole (PRILOSEC) 40 MG capsule Take 40 mg by mouth at bedtime.  01/11/14  Yes [provider]  predniSONE (DELTASONE) 5 MG tablet Take 5 mg by mouth daily with breakfast.    Yes [provider]  sertraline (ZOLOFT) 50 MG tablet Take 50 mg by mouth at bedtime.  08/15/19  Yes [provider]  sodium bicarbonate 650 MG tablet Take 1,950 mg by mouth 4 (four) times daily. 09/21/18  Yes [provider]  tacrolimus (PROGRAF) 1 MG capsule Take 2-3 mg by mouth See admin instructions. Take 3 mg by mouth in the morning at 9 AM and 2 mg in the evening at 9 PM   Yes [provider]  tamsulosin (FLOMAX) 0.4 MG CAPS capsule Take 0.4 mg by mouth daily after supper.    Yes [provider]   MR TIBIA FIBULA RIGHT WO CONTRAST  Result Date: 01/04/2020 CLINICAL DATA:  Possible osteomyelitis. EXAM: MRI OF LOWER RIGHT EXTREMITY WITHOUT CONTRAST TECHNIQUE: Multiplanar MR images of the remaining tibia/fibula were attempted. The has a below the knee amputation. There were cooperation issues and patient motion related issues because of this the distal stump and part of the tibia and fibula were not included. The technologist reports that this was the best exam the could be achieved due to cooperation related issues. COMPARISON:  CT scan 12/31/2019 FINDINGS: Despite efforts by the technologist and patient, motion artifact is present on today's exam and could not be eliminated. This reduces  exam sensitivity and specificity.  Bones/Joint/Cartilage No findings of osteomyelitis in the distal femur, in the patella, or in the included proximal 6 cm of the tibia. Due to cooperation related issues, we did not visualize about 2 cm of the distal most tibia or the overlying stump. A knee effusion is present. No compelling degree of synovitis to suggest that this is from septic joint, and no abnormal marrow signal along the joint surfaces. Ligaments The anterior and posterior cruciate ligaments appear intact. Muscles and Tendons No unexpected findings. Soft tissues Mild diffuse subcutaneous edema. IMPRESSION: 1. No findings of osteomyelitis in the distal femur, patella, or in the included proximal 6 cm of the tibia. Please note that the patient cooperation related issues, the distal 2 cm of the tibia/fibula, and the overlying stump, were not included in imaging. This includes the hyperdense collection previously seen along the stump which was thought to potentially be a hematoma on the prior CT from 12/31/2019. 2. No drainable abscess. 3. Small knee effusion. I am skeptical of septic joint given the lack of synovitis. 4. Despite efforts by the technologist and patient, motion artifact is present on today's exam and could not be eliminated. This reduces exam sensitivity and specificity. Electronically Signed   By: Van Clines M.D.   On: 01/04/2020 20:16   - pertinent xrays, CT, MRI studies were reviewed and independently interpreted  Positive ROS: All other systems have been reviewed and were otherwise negative with the exception of those mentioned in the HPI and as above.  Physical Exam: General: Alert, no acute distress Psychiatric: Patient is competent for consent with normal mood and affect Lymphatic: No axillary or cervical lymphadenopathy Cardiovascular: No pedal edema Respiratory: No cyanosis, no use of accessory musculature GI: No organomegaly, abdomen is soft and non-tender    Images:  _0 @  Labs:  Lab  Results  Component Value Date   HGBA1C 10.3 (H) 12/31/2019   HGBA1C 8.1 (H) 04/24/2019   HGBA1C 13.6 (H) 10/29/2018   ESRSEDRATE 75 (H) 06/05/2013   CRP 5.9 (H) 01/04/2020   REPTSTATUS 10/17/2019 FINAL 10/12/2019   GRAMSTAIN  05/24/2013    RARE WBC PRESENT,BOTH PMN AND MONONUCLEAR NO ORGANISMS SEEN Performed at [facility] Performed at [facility]  05/24/2013    RARE WBC PRESENT,BOTH PMN AND MONONUCLEAR NO ORGANISMS SEEN   CULT  10/12/2019    NO GROWTH 5 DAYS Performed at [facility], [place] [address], [place], [place] [address]    [place] CLOACAE 08/02/2014    Lab Results  Component Value Date   ALBUMIN 1.8 (L) 01/06/2020   ALBUMIN 1.7 (L) 01/05/2020   ALBUMIN 1.8 (L) 01/04/2020    Neurologic: Patient does not have protective sensation bilateral lower extremities.   MUSCULOSKELETAL:   Skin: Examination there is a draining ulcer over the fibula.  There is cellulitis and blistering over the distal tibia.  Review of the CT scan shows destructive bony changes of the distal tibia.  Review of the MRI scan shows that the MRI scan the window was not set up to incorporate the distal tibia and fibula so there is no way to evaluate potential osteomyelitis from the MRI scan due to the placement of the MRI window.  Assessment: Assessment: Osteomyelitis and ulceration was cellulitis right transtibial amputation.  Plan: Plan: Patient will need a revision of the right transtibial amputation will need to wait until patient is medically stable.  If patient is stable I could proceed  with revision transtibial amputation tomorrow on Friday otherwise could schedule for next week.  Thank you for the consult and the opportunity to see Paul [name], [place] [phone] 8:25 AM

## 2020-01-06 NOTE — Progress Notes (Signed)
Patient to dialysis via bed Prograf turned off per pharmacy until he returns

## 2020-01-06 NOTE — Progress Notes (Signed)
PROGRESS NOTE    Paul [name]  JSE:[id number] DOB: 1955/09/24 DOA: 12/30/2019 PCP: [name], MD   Brief Narrative:  HPI on 12/31/2019 by Dr. Wynetta Fines Paul [name] is a 64 y.o. male with medical history significant of kidney transplant recipient (2016) with CKD stage IV, hypertension, IDDM, chronic A. fib on Xarelto, B/L BKA secondary to PVD, presented with new onset of right BKA stump pain and rash.  Symptoms started about 1 week ago, patient attributed that to a unfit prosthetic which might caused a skin abrasions.  He started to notice significant pain rash associated the end of right leg stump about 3 days ago.  Pain has been constant and gradually getting worse, denies any fever chills.  He took some Tylenol for the pain without any relief.  Patient has been following with [facility] for the kidney transplant, he was seen in the office about 2 weeks ago and multiple blood work and urine analysis sent, urine protein/Cre>5,000, patient however was not informed about results yet.  And patient claims his kidney function has been stable, denies any back pain, decreased urine output or urine color.  Interim history  Patient was admitted with sepsis secondary to right leg osteomyelitis with an abscess on BKA stump.  She was placed on IV antibiotics with vancomycin and cefepime.  Patient will need incision and drainage.  Also noted to have atrial fibrillation with RVR, cardiology consulted.  Nephrology consulted patient is having AKI on CKD, stage IV.  Assessment & Plan   Severe sepsis secondary to right leg osteomyelitis and abscess on BKA stump site -Sepsis was present on admission -CT scan of lower extremity showed evidence of early osteomyelitis and possible abscess of source of infection -Continue vancomycin and cefepime -Orthopedic surgery consulted and appreciated, recommended MRI of the affected extremity-which showed no findings of osteomyelitis in the distal femur,  patella or 60 m of tibia.  Unfortunately patient had cooperation issues therefore the distal 2 cm of the tibia/fibula and overlying stump were not included in the imaging, especially hyperdense collection noted on CT scan on 12/31/2019.  Small knee effusion, skeptical of septic joint given lack of synovitis. -CRP mildly elevated -Patient may need incision and drainage -Orthopedics, Dr. Sharol Given, recommending revision of R BKA when patient is medically stable   Acute metabolic encephalopathy -likely secondary to the above -Improving but not back to baseline  Atrial fibrillation with RVR -Patient with chronic atrial fibrillation -Noted to have tachycardia with hypotension -Currently in sinus rhythm  -Cardiology consulted and appreciated -was placed on amiodarone drip- cardiology is transitioning to oral for 1 months course, starting with 400mg  BID for 7 days, followed by 200mg  for 21 days (starting 8/19) -Eliquis has been held, currently on heparin drip  Acute kidney injury on chronic kidney disease, stage IV with severe metabolic acidosis  -history of renal transplant -Nephrology consulted and appreciated, patient tolerated HD -Patient received bicarb for metabolic acidosis -Continue tacrolimus, prednisone, mycophenolate- will speak to pharmacy about changing these medications to IV -Creatinine 3.91 today -Continue to monitor BMP  Hypokalemia -Currently 4.1, will continue to monitor and replace as needed  Diabetes mellitus, type II, uncontrolled with hyperglycemia -Hemoglobin A1c 10.3 -patient is on low-dose prednisone -Continue insulin sliding scale and CBG monitoring  Mild hypomagnesemia  -Resolved with replacement, continue to monitor and replace as needed  History of pancreatic insufficiency -Patient had duodenal adenocarcinoma status post resection -Continue Creon which was added on 01/01/2020  Essential hypertension -Patient was  mildly hypotensive -Clonidine held -Continue  hydralazine with holding parameters  Hyperlipidemia -continue statin  Anemia of chronic disease -Hemoglobin 8.2 today, continue to monitor CBC and transfuse if needed  DVT Prophylaxis Heparin drip  Code Status: Full  Family Communication: None at bedside  Disposition Plan:  Status is: Inpatient  Remains inpatient appropriate because:Hemodynamically unstable, Persistent severe electrolyte disturbances, Ongoing diagnostic testing needed not appropriate for outpatient work up and Inpatient level of care appropriate due to severity of illness   Dispo:  Patient From: Home  Planned Disposition: Home with Health Care Svc  Expected discharge date: TBD  Medically stable for discharge: No   Consultants Orthopedic surgery Cardiology Nephrology  Procedures  None  Antibiotics   Anti-infectives (From admission, onward)   Start     Dose/Rate Route Frequency Ordered Stop   01/06/20 1200  vancomycin (VANCOCIN) IVPB 750 mg/150 ml premix        750 mg 150 mL/hr over 60 Minutes Intravenous Every T-Th-Sa (Hemodialysis) 01/05/20 1431     01/04/20 1130  vancomycin (VANCOCIN) 1-5 GM/200ML-% IVPB       Note to Pharmacy: Murriel Hopper   : cabinet override      01/04/20 1130 01/04/20 1333   12/31/19 1400  vancomycin (VANCOCIN) IVPB 1000 mg/200 mL premix  Status:  Discontinued        1,000 mg 200 mL/hr over 60 Minutes Intravenous Every 48 hours 12/31/19 1347 01/05/20 1431   12/31/19 1345  ceFEPIme (MAXIPIME) 2 g in sodium chloride 0.9 % 100 mL IVPB        2 g 200 mL/hr over 30 Minutes Intravenous Every 24 hours 12/31/19 1347     12/31/19 1330  vancomycin (VANCOCIN) IVPB 1000 mg/200 mL premix  Status:  Discontinued        1,000 mg 200 mL/hr over 60 Minutes Intravenous  Once 12/31/19 1315 12/31/19 1347      Subjective:   Paul [name] seen and examined today.  Continues to have confusion but more alert today and interactive. Currently picking his nose and causing it to bleed. Denies  current chest pain or shortness of breath.   Objective:   Vitals:   01/05/20 1930 01/06/20 0008 01/06/20 0427 01/06/20 0812  BP: 128/62 (!) 161/71 (!) 150/67   Pulse: (!) 57 (!) 59 65   Resp: 17 16 14    Temp: 97.9 F (36.6 C) 97.8 F (36.6 C) 98.3 F (36.8 C) 98.5 F (36.9 C)  TempSrc: Oral Oral Oral Oral  SpO2: 100% 100% 97% 100%  Weight:      Height:        Intake/Output Summary (Last 24 hours) at 01/06/2020 1028 Last data filed at 01/06/2020 0645 Gross per 24 hour  Intake 728.96 ml  Output 350 ml  Net 378.96 ml   Filed Weights   01/04/20 0920 01/04/20 1305 01/05/20 0628  Weight: 65.3 kg 64.3 kg 65.1 kg   Exam  General: Well developed, chronically ill-appearing, NAD  HEENT: NCAT, mucous membranes moist.  Nosebleed (patient actively picking at his nose)  Cardiovascular: S1 S2 auscultated, RRR, 2/6 SEM  Respiratory: Clear to auscultation bilaterally   Abdomen: Soft, nontender, nondistended, + bowel sounds  Extremities: Bilateral BKA.  Right stump with scant drainage.  Upper extremity edema  Neuro: AAOx2 (self, place), still confused but more interactive.   Psych: Cannot fully assess  Data Reviewed: I have personally reviewed following labs and imaging studies  CBC: Recent Labs  Lab 12/30/19 1819 12/30/19 1819 01/02/20 0353  01/03/20 0305 01/04/20 0727 01/05/20 0758 01/06/20 0054  WBC 13.3*   < > 11.4* 9.6 8.6 8.8 9.4  NEUTROABS 10.1*  --   --   --   --   --   --   HGB 10.4*   < > 8.0* 8.4* 7.6* 7.4* 8.2*  HCT 34.2*   < > 25.5* 25.9* 24.1* 22.6* 26.1*  MCV 92.4   < > 89.5 88.7 90.9 89.7 90.9  PLT 161   < > 142* 167 169 140* 148*   < > = values in this interval not displayed.   Basic Metabolic Panel: Recent Labs  Lab 01/02/20 0353 01/02/20 0353 01/03/20 0305 01/03/20 2306 01/04/20 0727 01/05/20 0758 01/06/20 0054  NA 143   < > 144 146* 144 140 140  K 3.6   < > 3.1* 3.0* 4.1 3.6 4.1  CL 116*   < > 106 115* 111 104 105  CO2 8*   < > 22 13*  16* 25 21*  GLUCOSE 260*   < > 204* 194* 240* 151* 130*  BUN 97*   < > 89* 92* 102* 41* 49*  CREATININE 5.50*   < > 5.35* 5.45* 6.11* 3.50* 3.91*  CALCIUM 7.9*   < > 7.3* 7.2* 7.7* 7.9* 8.1*  MG 1.6*  --  1.6* 1.7 2.3 2.0  --   PHOS 9.0*  --  8.5* 8.0*  --   --  5.9*   < > = values in this interval not displayed.   GFR: Estimated Creatinine Clearance: 17.1 mL/min (A) (by C-G formula based on SCr of 3.91 mg/dL (H)). Liver Function Tests: Recent Labs  Lab 01/01/20 1100 01/01/20 1100 01/02/20 0353 01/02/20 0353 01/03/20 0305 01/03/20 2306 01/04/20 0727 01/05/20 0758 01/06/20 0054  AST 16  --  14*  --  11*  --  9* 13*  --   ALT 13  --  14  --  12  --  13 13  --   ALKPHOS 50  --  52  --  59  --  56 58  --   BILITOT 0.9  --  1.0  --  0.9  --  0.7 0.9  --   PROT 4.6*  --  4.8*  --  4.7*  --  4.6* 4.5*  --   ALBUMIN 2.0*   < > 2.1*   < > 2.0* 1.8* 1.8* 1.7* 1.8*   < > = values in this interval not displayed.   No results for input(s): LIPASE, AMYLASE in the last 168 hours. No results for input(s): AMMONIA in the last 168 hours. Coagulation Profile: Recent Labs  Lab 01/04/20 0751  INR 1.2   Cardiac Enzymes: Recent Labs  Lab 01/01/20 1100  CKTOTAL 56   BNP (last 3 results) No results for input(s): PROBNP in the last 8760 hours. HbA1C: No results for input(s): HGBA1C in the last 72 hours. CBG: Recent Labs  Lab 01/05/20 0627 01/05/20 1104 01/05/20 1605 01/05/20 2146 01/06/20 0625  GLUCAP 159* 129* 134* 101* 126*   Lipid Profile: No results for input(s): CHOL, HDL, LDLCALC, TRIG, CHOLHDL, LDLDIRECT in the last 72 hours. Thyroid Function Tests: No results for input(s): TSH, T4TOTAL, FREET4, T3FREE, THYROIDAB in the last 72 hours. Anemia Panel: Recent Labs    01/04/20 1443  FERRITIN 657*  TIBC 111*  IRON 90   Urine analysis:    Component Value Date/Time   COLORURINE YELLOW 12/31/2019 1420   APPEARANCEUR HAZY (A) 12/31/2019 1420  LABSPEC 1.010 12/31/2019  1420   PHURINE 5.0 12/31/2019 1420   GLUCOSEU 150 (A) 12/31/2019 1420   HGBUR MODERATE (A) 12/31/2019 1420   BILIRUBINUR NEGATIVE 12/31/2019 1420   KETONESUR NEGATIVE 12/31/2019 1420   PROTEINUR >=300 (A) 12/31/2019 1420   UROBILINOGEN 0.2 08/02/2014 1424   NITRITE NEGATIVE 12/31/2019 1420   LEUKOCYTESUR MODERATE (A) 12/31/2019 1420   Sepsis Labs: @LABRCNTIP (procalcitonin:4,lacticidven:4)  ) Recent Results (from the past 240 hour(s))  SARS Coronavirus 2 by RT PCR (hospital order, performed in [facility] hospital lab) Nasopharyngeal Nasopharyngeal Swab     Status: None   Collection Time: 12/31/19 11:15 AM   Specimen: Nasopharyngeal Swab  Result Value Ref Range Status   SARS Coronavirus 2 NEGATIVE NEGATIVE Final    Comment: (NOTE) SARS-CoV-2 target nucleic acids are NOT DETECTED.  The SARS-CoV-2 RNA is generally detectable in upper and lower respiratory specimens during the acute phase of infection. The lowest concentration of SARS-CoV-2 viral copies this assay can detect is 250 copies / mL. A negative result does not preclude SARS-CoV-2 infection and should not be used as the sole basis for treatment or other patient management decisions.  A negative result may occur with improper specimen collection / handling, submission of specimen other than nasopharyngeal swab, presence of viral mutation(s) within the areas targeted by this assay, and inadequate number of viral copies (<250 copies / mL). A negative result must be combined with clinical observations, patient history, and epidemiological information.  Fact Sheet for Patients:   StrictlyIdeas.no  Fact Sheet for Healthcare Providers: BankingDealers.co.za  This test is not yet approved or  cleared by the [place] FDA and has been authorized for detection and/or diagnosis of SARS-CoV-2 by FDA under an Emergency Use Authorization (EUA).  This EUA will remain in effect  (meaning this test can be used) for the duration of the COVID-19 declaration under Section 564(b)(1) of the Act, 21 U.S.C. section 360bbb-3(b)(1), unless the authorization is terminated or revoked sooner.  Performed at [facility], [place] [address] [place], [place] [address]       Radiology Studies: MR TIBIA FIBULA RIGHT WO CONTRAST  Result Date: 01/04/2020 CLINICAL DATA:  Possible osteomyelitis. EXAM: MRI OF LOWER RIGHT EXTREMITY WITHOUT CONTRAST TECHNIQUE: Multiplanar MR images of the remaining tibia/fibula were attempted. The has a below the knee amputation. There were cooperation issues and patient motion related issues because of this the distal stump and part of the tibia and fibula were not included. The technologist reports that this was the best exam the could be achieved due to cooperation related issues. COMPARISON:  CT scan 12/31/2019 FINDINGS: Despite efforts by the technologist and patient, motion artifact is present on today's exam and could not be eliminated. This reduces exam sensitivity and specificity. Bones/Joint/Cartilage No findings of osteomyelitis in the distal femur, in the patella, or in the included proximal 6 cm of the tibia. Due to cooperation related issues, we did not visualize about 2 cm of the distal most tibia or the overlying stump. A knee effusion is present. No compelling degree of synovitis to suggest that this is from septic joint, and no abnormal marrow signal along the joint surfaces. Ligaments The anterior and posterior cruciate ligaments appear intact. Muscles and Tendons No unexpected findings. Soft tissues Mild diffuse subcutaneous edema. IMPRESSION: 1. No findings of osteomyelitis in the distal femur, patella, or in the included proximal 6 cm of the tibia. Please note that the patient cooperation related issues, the distal 2 cm of  the tibia/fibula, and the overlying stump, were not included in imaging. This includes the hyperdense collection  previously seen along the stump which was thought to potentially be a hematoma on the prior CT from 12/31/2019. 2. No drainable abscess. 3. Small knee effusion. I am skeptical of septic joint given the lack of synovitis. 4. Despite efforts by the technologist and patient, motion artifact is present on today's exam and could not be eliminated. This reduces exam sensitivity and specificity. Electronically Signed   By: Van Clines M.D.   On: 01/04/2020 20:16     Scheduled Meds: . amiodarone  400 mg Oral BID   Followed by  . [START ON 01/13/2020] amiodarone  200 mg Oral Daily  . amLODipine  5 mg Oral Daily  . aspirin EC  81 mg Oral Daily  . atorvastatin  10 mg Oral Q M,W,F  . calcium acetate  2,001 mg Oral TID WC  . Chlorhexidine Gluconate Cloth  6 each Topical Q0600  . cholecalciferol  4,000 Units Oral Daily  . darbepoetin (ARANESP) injection - DIALYSIS  60 mcg Intravenous Q Thu-HD  . insulin aspart  0-9 Units Subcutaneous TID WC  . lipase/protease/amylase  12,000 Units Oral TID AC  . magnesium chloride  2 tablet Oral BID  . pantoprazole  40 mg Oral Daily  . predniSONE  5 mg Oral Q breakfast  . sertraline  50 mg Oral Daily  . tamsulosin  0.4 mg Oral QPC supper   Continuous Infusions: . sodium chloride    . sodium chloride    . ceFEPime (MAXIPIME) IV 2 g (01/05/20 1504)  . mycophenolate (CELLCEPT) IV 500 mg (01/06/20 0630)  . tacrolimus (PROGRAF) IV 0.01 mg/kg/day (01/05/20 1809)  . vancomycin       LOS: 6 days   Time Spent in minutes   45 minutes  Paul [name] D.O. on 01/06/2020 at 10:28 AM  Between 7am to 7pm - Please see pager noted on [email or web address]  After 7pm go to [email or web address]  And look for the night coverage person covering for me after hours  [facility] Office  [phone]

## 2020-01-06 NOTE — H&P (View-Only) (Signed)
ORTHOPAEDIC CONSULTATION  REQUESTING PHYSICIAN: Cristal Ford, DO  Chief Complaint: Painful ulcer right below-knee amputation.  HPI: Paul [name] is a 64 y.o. male who presents with multiple medical problems status post renal transplant who has cellulitis blistering and draining ulceration over the right transtibial amputation.  Past Medical History:  Diagnosis Date  . A-fib ([place])   . Anemia    esrd  . Calciphylaxis 05/2013   lower ext  . Cancer ([place])    hx duodenal adenoCa 2002, resected  . Closed left arm fracture 1967  . Corneal abrasion, left   . Depression   . Diabetes mellitus    Type 1 iddm x 11 yrs  . Diabetic neuropathy ([place])   . ESRD on hemodialysis ([place])    Pt has ESRD due to DM.  He had a L upper arm AVF prior to starting HD which was ligated due to L arm steal syndrome.  He started HD in Jan 2014 and did home HD.  The family couldn't cannulate the R arm AVF successfully so by mid 2014 they decided to switch to PD which was done late summer 2014.  In Jan 2015 he was admitted with FTT felt to be due to underdialysis on PD and PD was abandoned and he   . GERD (gastroesophageal reflux disease)   . Glomerulosclerosis, diabetic ([place])   . Heart murmur   . History of blood transfusion   . Hypertension    off of meds due to orthostatic hypotension  . Kidney transplant recipient   . Open wound of both legs with complication    Pt has had progressive wounds of both LE's including gangrene of the toes and patchy necrosis of the calves.  He has been treated at [facility] by Dr Jerline Pain with hyperbaric O2 5d per week.  He is getting Na Thiosulfate with HD for suspected calciphylaxis. Pt says doctor's aren't sure if these ulcers were diabetic ulcers or calciphylaxis.  He underwent bilat BKA on 07/30/13.  He says that the woun   Past Surgical History:  Procedure Laterality Date  . AMPUTATION Bilateral 07/30/2013   Procedure: AMPUTATION BELOW KNEE;  Surgeon: Newt Minion,  MD;  Location: [place];  Service: Orthopedics;  Laterality: Bilateral;  Bilateral Below Knee Amputations  . AMPUTATION Bilateral 09/03/2013   Procedure: AMPUTATION BELOW KNEE;  Surgeon: Newt Minion, MD;  Location: [place];  Service: Orthopedics;  Laterality: Bilateral;  Bilateral Below Knee Amputation Revision  . AV FISTULA PLACEMENT  06/07/2011   Procedure: ARTERIOVENOUS (AV) FISTULA CREATION;  Surgeon: Angelia Mould, MD;  Location: [place];  Service: Vascular;  Laterality: Left;  . AV FISTULA PLACEMENT  06/18/2012   Procedure: ARTERIOVENOUS (AV) FISTULA CREATION;  Surgeon: Mal Misty, MD;  Location: [place];  Service: Vascular;  Laterality: Right;  [place]  . BILIARY DIVERSION, EXTERNAL  2002  . BONE MARROW BIOPSY  2012  . CAPD INSERTION N/A 01/14/2013   Procedure: LAPAROSCOPIC INSERTION CONTINUOUS AMBULATORY PERITONEAL DIALYSIS  (CAPD) CATHETER;  Surgeon: Adin Hector, MD;  Location: [place];  Service: General;  Laterality: N/A;  . CAPD REMOVAL N/A 07/09/2013   Procedure: CONTINUOUS AMBULATORY PERITONEAL DIALYSIS  (CAPD) CATHETER REMOVAL;  Surgeon: Adin Hector, MD;  Location: [place];  Service: General;  Laterality: N/A;  . COLONOSCOPY     Hx: of  . INSERTION OF DIALYSIS CATHETER  05/22/2012   Procedure: INSERTION OF DIALYSIS CATHETER;  Surgeon: Mal Misty, MD;  Location: [facility];  Service: Vascular;  Laterality: N/A;  right internal jugular vein  . IR ANGIO INTRA EXTRACRAN SEL COM CAROTID INNOMINATE BILAT MOD SED  10/28/2018  . IR ANGIO VERTEBRAL SEL SUBCLAVIAN INNOMINATE UNI R MOD SED  10/28/2018  . IR ANGIO VERTEBRAL SEL VERTEBRAL UNI L MOD SED  10/28/2018  . LIGATION OF ARTERIOVENOUS  FISTULA  05/22/2012   Procedure: LIGATION OF ARTERIOVENOUS  FISTULA;  Surgeon: Mal Misty, MD;  Location: [place];  Service: Vascular;  Laterality: Left;  Left brachio-cephalic fistula  . LOWER EXTREMITY ANGIOGRAM N/A 06/14/2013   Procedure: LOWER EXTREMITY ANGIOGRAM;  Surgeon: Angelia Mould, MD;   Location: [facility] C[facility] LAB;  Service: Cardiovascular;  Laterality: N/A;  . NEPHRECTOMY TRANSPLANTED ORGAN    . OTHER SURGICAL HISTORY     Cyst removed from back  . PORTACATH PLACEMENT  2002  . RADIOLOGY WITH ANESTHESIA N/A 10/28/2018   Procedure: IR WITH ANESTHESIA;  Surgeon: Radiologist, Medication, MD;  Location: [place];  Service: Radiology;  Laterality: N/A;  . REMOVAL OF A DIALYSIS CATHETER    . RENAL BIOPSY, PERCUTANEOUS  2012  . TONSILLECTOMY    . VASCULAR SURGERY    . WHIPPLE PROCEDURE  2002   duodenal ca, Dr. Harlow Asa   Social History   Socioeconomic History  . Marital status: Married    Spouse name: Not on file  . Number of children: Not on file  . Years of education: Not on file  . Highest education level: Not on file  Occupational History  . Not on file  Tobacco Use  . Smoking status: Never Smoker  . Smokeless tobacco: Never Used  Vaping Use  . Vaping Use: Never used  Substance and Sexual Activity  . Alcohol use: No  . Drug use: No  . Sexual activity: Not on file  Other Topics Concern  . Not on file  Social History Narrative  . Not on file   Social Determinants of Health   Financial Resource Strain:   . Difficulty of Paying Living Expenses: Not on file  Food Insecurity:   . Worried About Charity fundraiser in the Last Year: Not on file  . Ran Out of Food in the Last Year: Not on file  Transportation Needs:   . Lack of Transportation (Medical): Not on file  . Lack of Transportation (Non-Medical): Not on file  Physical Activity:   . Days of Exercise per Week: Not on file  . Minutes of Exercise per Session: Not on file  Stress:   . Feeling of Stress : Not on file  Social Connections:   . Frequency of Communication with Friends and Family: Not on file  . Frequency of Social Gatherings with Friends and Family: Not on file  . Attends Religious Services: Not on file  . Active Member of Clubs or Organizations: Not on file  . Attends Archivist Meetings:  Not on file  . Marital Status: Not on file   Family History  Problem Relation Age of Onset  . Cancer Mother   . Cancer Father        prostate cancer  . Heart disease Maternal Grandfather   . Stroke Paternal Grandmother    - negative except otherwise stated in the family history section Allergies  Allergen Reactions  . Metformin And Related Other (See Comments)    Has kidney problems   Prior to Admission medications   Medication Sig Start Date End Date Taking? Authorizing Provider  acetaminophen (TYLENOL) 500 MG tablet Take 500 mg by mouth every 6 (six) hours as needed for mild pain or moderate pain.   Yes [provider]  amLODipine (NORVASC) 10 MG tablet Take 10 mg by mouth daily.   Yes [provider]  aspirin 81 MG chewable tablet Chew 81 mg by mouth daily.   Yes [provider]  atorvastatin (LIPITOR) 10 MG tablet Take 1 tablet (10 mg total) by mouth daily. Patient taking differently: Take 10 mg by mouth every Monday, Wednesday, and Friday.  08/13/13  Yes Angiulli, Lavon Paganini, PA-C  calcitRIOL (ROCALTROL) 0.25 MCG capsule Take 0.25 mcg by mouth daily.    Yes [provider]  calcium acetate (PHOSLO) 667 MG capsule Take 1,334 mg by mouth 3 (three) times daily with meals.   Yes [provider]  [employer] D3 50 MCG (2000 UT) CAPS Take 4,000 Units by mouth daily. 09/18/18  Yes [provider]  ELIQUIS 5 MG TABS tablet TAKE 1 TABLET BY MOUTH TWICE A DAY Patient taking differently: Take 5 mg by mouth 2 (two) times daily.  07/13/19  Yes O'Neal, Cassie Freer, MD  feeding supplement, GLUCERNA SHAKE, (GLUCERNA SHAKE) LIQD Take 237 mLs by mouth 3 (three) times daily between meals.   Yes [provider]  HUMALOG KWIKPEN 100 UNIT/ML KwikPen Inject 4-8 Units into the skin daily. Sliding Scale 01/11/19  Yes [provider]  hydrALAZINE (APRESOLINE) 100 MG tablet Take 100 mg by mouth 2 (two) times daily. Afternoon, bedtime 08/19/19  Yes  [provider]  Insulin Glargine (BASAGLAR KWIKPEN) 100 UNIT/ML SOPN Inject 10 Units into the skin daily.  01/11/19  Yes [provider]  magnesium chloride (SLOW-MAG) 64 MG TBEC SR tablet Take 2 tablets by mouth 2 (two) times daily. Take at least 2 hours separate from tacrolimus. 10/02/14  Yes [provider]  mycophenolate (MYFORTIC) 180 MG EC tablet Take 360 mg by mouth 2 (two) times daily.    Yes [provider]  omeprazole (PRILOSEC) 40 MG capsule Take 40 mg by mouth at bedtime.  01/11/14  Yes [provider]  predniSONE (DELTASONE) 5 MG tablet Take 5 mg by mouth daily with breakfast.    Yes [provider]  sertraline (ZOLOFT) 50 MG tablet Take 50 mg by mouth at bedtime.  08/15/19  Yes [provider]  sodium bicarbonate 650 MG tablet Take 1,950 mg by mouth 4 (four) times daily. 09/21/18  Yes [provider]  tacrolimus (PROGRAF) 1 MG capsule Take 2-3 mg by mouth See admin instructions. Take 3 mg by mouth in the morning at 9 AM and 2 mg in the evening at 9 PM   Yes [provider]  tamsulosin (FLOMAX) 0.4 MG CAPS capsule Take 0.4 mg by mouth daily after supper.    Yes [provider]   MR TIBIA FIBULA RIGHT WO CONTRAST  Result Date: 01/04/2020 CLINICAL DATA:  Possible osteomyelitis. EXAM: MRI OF LOWER RIGHT EXTREMITY WITHOUT CONTRAST TECHNIQUE: Multiplanar MR images of the remaining tibia/fibula were attempted. The has a below the knee amputation. There were cooperation issues and patient motion related issues because of this the distal stump and part of the tibia and fibula were not included. The technologist reports that this was the best exam the could be achieved due to cooperation related issues. COMPARISON:  CT scan 12/31/2019 FINDINGS: Despite efforts by the technologist and patient, motion artifact is present on today's exam and could not be eliminated. This reduces  exam sensitivity and specificity.  Bones/Joint/Cartilage No findings of osteomyelitis in the distal femur, in the patella, or in the included proximal 6 cm of the tibia. Due to cooperation related issues, we did not visualize about 2 cm of the distal most tibia or the overlying stump. A knee effusion is present. No compelling degree of synovitis to suggest that this is from septic joint, and no abnormal marrow signal along the joint surfaces. Ligaments The anterior and posterior cruciate ligaments appear intact. Muscles and Tendons No unexpected findings. Soft tissues Mild diffuse subcutaneous edema. IMPRESSION: 1. No findings of osteomyelitis in the distal femur, patella, or in the included proximal 6 cm of the tibia. Please note that the patient cooperation related issues, the distal 2 cm of the tibia/fibula, and the overlying stump, were not included in imaging. This includes the hyperdense collection previously seen along the stump which was thought to potentially be a hematoma on the prior CT from 12/31/2019. 2. No drainable abscess. 3. Small knee effusion. I am skeptical of septic joint given the lack of synovitis. 4. Despite efforts by the technologist and patient, motion artifact is present on today's exam and could not be eliminated. This reduces exam sensitivity and specificity. Electronically Signed   By: Van Clines M.D.   On: 01/04/2020 20:16   - pertinent xrays, CT, MRI studies were reviewed and independently interpreted  Positive ROS: All other systems have been reviewed and were otherwise negative with the exception of those mentioned in the HPI and as above.  Physical Exam: General: Alert, no acute distress Psychiatric: Patient is competent for consent with normal mood and affect Lymphatic: No axillary or cervical lymphadenopathy Cardiovascular: No pedal edema Respiratory: No cyanosis, no use of accessory musculature GI: No organomegaly, abdomen is soft and non-tender    Images:  _0 @  Labs:  Lab  Results  Component Value Date   HGBA1C 10.3 (H) 12/31/2019   HGBA1C 8.1 (H) 04/24/2019   HGBA1C 13.6 (H) 10/29/2018   ESRSEDRATE 75 (H) 06/05/2013   CRP 5.9 (H) 01/04/2020   REPTSTATUS 10/17/2019 FINAL 10/12/2019   GRAMSTAIN  05/24/2013    RARE WBC PRESENT,BOTH PMN AND MONONUCLEAR NO ORGANISMS SEEN Performed at [facility] Performed at [facility]  05/24/2013    RARE WBC PRESENT,BOTH PMN AND MONONUCLEAR NO ORGANISMS SEEN   CULT  10/12/2019    NO GROWTH 5 DAYS Performed at [facility], [place] [address], [place], [place] [address]    [place] CLOACAE 08/02/2014    Lab Results  Component Value Date   ALBUMIN 1.8 (L) 01/06/2020   ALBUMIN 1.7 (L) 01/05/2020   ALBUMIN 1.8 (L) 01/04/2020    Neurologic: Patient does not have protective sensation bilateral lower extremities.   MUSCULOSKELETAL:   Skin: Examination there is a draining ulcer over the fibula.  There is cellulitis and blistering over the distal tibia.  Review of the CT scan shows destructive bony changes of the distal tibia.  Review of the MRI scan shows that the MRI scan the window was not set up to incorporate the distal tibia and fibula so there is no way to evaluate potential osteomyelitis from the MRI scan due to the placement of the MRI window.  Assessment: Assessment: Osteomyelitis and ulceration was cellulitis right transtibial amputation.  Plan: Plan: Patient will need a revision of the right transtibial amputation will need to wait until patient is medically stable.  If patient is stable I could proceed  with revision transtibial amputation tomorrow on Friday otherwise could schedule for next week.  Thank you for the consult and the opportunity to see Mr. Zaccheaus Storlie, [place] [phone] 8:25 AM

## 2020-01-06 NOTE — Progress Notes (Signed)
Pharmacy Antibiotic Note  Paul [name] is a 64 y.o. male admitted on 12/30/2019 with osteomyelitis - planing for amputation 8/20,  has a history of CKD4, kidney transplant . Pharmacy has been consulted for vancomycin and cefepime dosing.  Plan: - Continue Vancomycin 1gm x1 then 750mg  qhd - next 8/19 (HD TTS) - Target a goal vancomycin trough of 15-20 mcg/mL - Continue Cefepime 2 g q24h  - Monitor renal function, clinical status, cultures and length of therapy - Deescalate therapy as needed   Height: 6' (182.9 cm) Weight: 65.1 kg (143 lb 8.3 oz) IBW/kg (Calculated) : 77.6  Temp (24hrs), Avg:98.2 F (36.8 C), Min:97.7 F (36.5 C), Max:98.7 F (37.1 C)  Recent Labs  Lab 12/30/19 1822 12/31/19 1115 01/02/20 0353 01/02/20 0353 01/03/20 0305 01/03/20 2306 01/04/20 0727 01/05/20 0758 01/06/20 0054  WBC  --   --  11.4*  --  9.6  --  8.6 8.8 9.4  CREATININE  --    < > 5.50*   < > 5.35* 5.45* 6.11* 3.50* 3.91*  LATICACIDVEN 0.5  --   --   --   --   --   --   --   --    < > = values in this interval not displayed.    Estimated Creatinine Clearance: 17.1 mL/min (A) (by C-G formula based on SCr of 3.91 mg/dL (H)).    Allergies  Allergen Reactions   Metformin And Related Other (See Comments)    Has kidney problems    Antimicrobials this admission: 8/13 vancomycin >> 8/13 cefepime >>  Microbiology results: None   Bonnita Nasuti Pharm.D. CPP, BCPS Clinical Pharmacist [phone] 01/06/2020 9:14 AM    .

## 2020-01-06 NOTE — Plan of Care (Signed)

## 2020-01-07 ENCOUNTER — Ambulatory Visit: Payer: Managed Care, Other (non HMO)

## 2020-01-07 LAB — CBC
HCT: 24.9 % — ABNORMAL LOW (ref 39.0–52.0)
Hemoglobin: 7.7 g/dL — ABNORMAL LOW (ref 13.0–17.0)
MCH: 27.6 pg (ref 26.0–34.0)
MCHC: 30.9 g/dL (ref 30.0–36.0)
MCV: 89.2 fL (ref 80.0–100.0)
Platelets: 124 10*3/uL — ABNORMAL LOW (ref 150–400)
RBC: 2.79 MIL/uL — ABNORMAL LOW (ref 4.22–5.81)
RDW: 14.7 % (ref 11.5–15.5)
WBC: 8.2 10*3/uL (ref 4.0–10.5)
nRBC: 0 % (ref 0.0–0.2)

## 2020-01-07 LAB — GLUCOSE, CAPILLARY
Glucose-Capillary: 145 mg/dL — ABNORMAL HIGH (ref 70–99)
Glucose-Capillary: 152 mg/dL — ABNORMAL HIGH (ref 70–99)
Glucose-Capillary: 121 mg/dL — ABNORMAL HIGH (ref 70–99)
Glucose-Capillary: 203 mg/dL — ABNORMAL HIGH (ref 70–99)
Glucose-Capillary: 198 mg/dL — ABNORMAL HIGH (ref 70–99)

## 2020-01-07 LAB — HEPARIN LEVEL (UNFRACTIONATED): Heparin Unfractionated: 0.5 IU/mL (ref 0.30–0.70)

## 2020-01-07 MED ORDER — MYCOPHENOLATE SODIUM 180 MG PO TBEC
360.0000 mg | DELAYED_RELEASE_TABLET | Freq: Two times a day (BID) | ORAL | Status: DC
  Administered 2020-01-07 – 2020-01-08 (×3): 360 mg via ORAL
  Filled 2020-01-07 (×3): qty 2

## 2020-01-07 MED ORDER — HEPARIN (PORCINE) 25000 UT/250ML-% IV SOLN
1350.0000 [IU]/h | INTRAVENOUS | Status: DC
  Administered 2020-01-07 – 2020-01-11 (×6): 1350 [IU]/h via INTRAVENOUS
  Filled 2020-01-07 (×8): qty 250

## 2020-01-07 MED ORDER — TACROLIMUS 1 MG PO CAPS
2.0000 mg | ORAL_CAPSULE | Freq: Two times a day (BID) | ORAL | Status: DC
  Administered 2020-01-07 – 2020-01-18 (×23): 2 mg via ORAL
  Filled 2020-01-07 (×23): qty 2

## 2020-01-07 NOTE — Progress Notes (Addendum)
Renal Navigator sent message to [facility] Admissions to check on status of referral submitted on 01/05/20.  Navigator will continue to follow closely. Update: Patient has been accepted for OP HD at [facility] on a TTS schedule with a seat time of 11:30am. He needs to arrive to his appointments 20 minutes early. On his first day at the clinic, he needs to arrive at 10:30am to complete intake paperwork prior to his first treatment.   Alphonzo Cruise, [place] Renal Navigator [phone]

## 2020-01-07 NOTE — Progress Notes (Signed)
PROGRESS NOTE    Paul [name]  NWG:[id number] DOB: 01/06/1956 DOA: 12/30/2019 PCP: [name], MD   Brief Narrative:  HPI on 12/31/2019 by Dr. Wynetta Fines Paul [name] is a 64 y.o. male with medical history significant of kidney transplant recipient (2016) with CKD stage IV, hypertension, IDDM, chronic A. fib on Xarelto, B/L BKA secondary to PVD, presented with new onset of right BKA stump pain and rash.  Symptoms started about 1 week ago, patient attributed that to a unfit prosthetic which might caused a skin abrasions.  He started to notice significant pain rash associated the end of right leg stump about 3 days ago.  Pain has been constant and gradually getting worse, denies any fever chills.  He took some Tylenol for the pain without any relief.  Patient has been following with [facility] for the kidney transplant, he was seen in the office about 2 weeks ago and multiple blood work and urine analysis sent, urine protein/Cre>5,000, patient however was not informed about results yet.  And patient claims his kidney function has been stable, denies any back pain, decreased urine output or urine color.  Interim history  Patient was admitted with sepsis secondary to right leg osteomyelitis with an abscess on BKA stump.  She was placed on IV antibiotics with vancomycin and cefepime.  Patient will need incision and drainage.  Also noted to have atrial fibrillation with RVR, cardiology consulted.  Nephrology consulted patient is having AKI on CKD, stage IV. Pending revision of amputation next week.   Assessment & Plan   Severe sepsis secondary to right leg osteomyelitis and abscess on BKA stump site -Sepsis was present on admission -CT scan of lower extremity showed evidence of early osteomyelitis and possible abscess of source of infection -Continue vancomycin and cefepime -Orthopedic surgery consulted and appreciated, recommended MRI of the affected extremity-which showed no  findings of osteomyelitis in the distal femur, patella or 60 m of tibia.  Unfortunately patient had cooperation issues therefore the distal 2 cm of the tibia/fibula and overlying stump were not included in the imaging, especially hyperdense collection noted on CT scan on 12/31/2019.  Small knee effusion, skeptical of septic joint given lack of synovitis. -CRP mildly elevated -Patient may need incision and drainage -Orthopedics, Dr. Sharol Given, recommending revision of R BKA when patient is medically stable - likely next week  Acute metabolic encephalopathy -likely secondary to the above -Improving, patient currently alert and oriented x3  Atrial fibrillation with RVR -Patient with chronic atrial fibrillation -Noted to have tachycardia with hypotension -Currently in sinus rhythm  -Cardiology consulted and appreciated -was placed on amiodarone drip- cardiology is transitioning to oral for 1 months course, starting with 400mg  BID for 7 days, followed by 200mg  for 21 days (starting 8/19) -Eliquis has been held, currently on heparin drip  Acute kidney injury on chronic kidney disease, stage IV with severe metabolic acidosis  -history of renal transplant -Nephrology consulted and appreciated, patient tolerated HD -Patient received bicarb for metabolic acidosis -Continue tacrolimus, prednisone, mycophenolate- will speak to pharmacy about changing these medications to IV -Creatinine 3.91  -Continue to monitor BMP  Hypokalemia -Currently 4.1, will continue to monitor and replace as needed  Diabetes mellitus, type II, uncontrolled with hyperglycemia -Hemoglobin A1c 10.3 -patient is on low-dose prednisone -Continue insulin sliding scale and CBG monitoring  Mild hypomagnesemia  -Resolved with replacement, continue to monitor and replace as needed  History of pancreatic insufficiency -Patient had duodenal adenocarcinoma status post resection -Continue  Creon which was added on 01/01/2020  Essential  hypertension -Patient was mildly hypotensive -Clonidine held -Continue hydralazine with holding parameters  Hyperlipidemia -continue statin  Anemia of chronic disease -Hemoglobin 7.7 today, continue to monitor CBC and transfuse if needed  DVT Prophylaxis Heparin drip  Code Status: Full  Family Communication: None at bedside  Disposition Plan:  Status is: Inpatient  Remains inpatient appropriate because:Hemodynamically unstable, Persistent severe electrolyte disturbances, Ongoing diagnostic testing needed not appropriate for outpatient work up and Inpatient level of care appropriate due to severity of illness   Dispo:  Patient From: Home  Planned Disposition: Home with Health Care Svc  Expected discharge date: TBD  Medically stable for discharge: No   Consultants Orthopedic surgery Cardiology Nephrology  Procedures  None  Antibiotics   Anti-infectives (From admission, onward)   Start     Dose/Rate Route Frequency Ordered Stop   01/06/20 1200  vancomycin (VANCOCIN) IVPB 750 mg/150 ml premix        750 mg 150 mL/hr over 60 Minutes Intravenous Every T-Th-Sa (Hemodialysis) 01/05/20 1431     01/04/20 1130  vancomycin (VANCOCIN) 1-5 GM/200ML-% IVPB       Note to Pharmacy: Paul [name]   : cabinet override      01/04/20 1130 01/04/20 1333   12/31/19 1400  vancomycin (VANCOCIN) IVPB 1000 mg/200 mL premix  Status:  Discontinued        1,000 mg 200 mL/hr over 60 Minutes Intravenous Every 48 hours 12/31/19 1347 01/05/20 1431   12/31/19 1345  ceFEPIme (MAXIPIME) 2 g in sodium chloride 0.9 % 100 mL IVPB        2 g 200 mL/hr over 30 Minutes Intravenous Every 24 hours 12/31/19 1347     12/31/19 1330  vancomycin (VANCOCIN) IVPB 1000 mg/200 mL premix  Status:  Discontinued        1,000 mg 200 mL/hr over 60 Minutes Intravenous  Once 12/31/19 1315 12/31/19 1347      Subjective:   Paul [name] seen and examined today.  Patient with no common complaints this morning.  States  he knows he is waiting for surgery and that Dr. Sharol Given seen him.  Denies current chest pain, shortness of breath, abdominal pain, nausea or vomiting, diarrhea or constipation.   Objective:   Vitals:   01/07/20 0356 01/07/20 0400 01/07/20 0700 01/07/20 1100  BP: (!) 166/66 (!) 167/71    Pulse: 69 (!) 58    Resp: 14 12    Temp: 98.2 F (36.8 C)  98.2 F (36.8 C) 98.2 F (36.8 C)  TempSrc: Oral  Oral Oral  SpO2: 98% 96%    Weight:      Height:        Intake/Output Summary (Last 24 hours) at 01/07/2020 1449 Last data filed at 01/07/2020 0600 Gross per 24 hour  Intake 29.12 ml  Output 1300 ml  Net -1270.88 ml   Filed Weights   01/05/20 0628 01/06/20 1440 01/06/20 1815  Weight: 65.1 kg 66 kg 65.1 kg   Exam  General: Well developed, chronically ill-appearing, NAD  HEENT: NCAT, mucous membranes moist.    Cardiovascular: S1 S2 auscultated, RRR, 2/6 SEM  Respiratory: Clear to auscultation bilaterally   Abdomen: Soft, nontender, nondistended, + bowel sounds  Extremities: Bilateral BKA.  Right stump with dressing in place.  Upper extremity edema-improving  Neuro: AAOx3, nonfocal  Psych: appropriate mood and affect  Data Reviewed: I have personally reviewed following labs and imaging studies  CBC: Recent Labs  Lab 01/03/20 0305 01/04/20 0727 01/05/20 0758 01/06/20 0054 01/07/20 0023  WBC 9.6 8.6 8.8 9.4 8.2  HGB 8.4* 7.6* 7.4* 8.2* 7.7*  HCT 25.9* 24.1* 22.6* 26.1* 24.9*  MCV 88.7 90.9 89.7 90.9 89.2  PLT 167 169 140* 148* 789*   Basic Metabolic Panel: Recent Labs  Lab 01/02/20 0353 01/02/20 0353 01/03/20 0305 01/03/20 2306 01/04/20 0727 01/05/20 0758 01/06/20 0054  NA 143   < > 144 146* 144 140 140  K 3.6   < > 3.1* 3.0* 4.1 3.6 4.1  CL 116*   < > 106 115* 111 104 105  CO2 8*   < > 22 13* 16* 25 21*  GLUCOSE 260*   < > 204* 194* 240* 151* 130*  BUN 97*   < > 89* 92* 102* 41* 49*  CREATININE 5.50*   < > 5.35* 5.45* 6.11* 3.50* 3.91*  CALCIUM 7.9*   < >  7.3* 7.2* 7.7* 7.9* 8.1*  MG 1.6*  --  1.6* 1.7 2.3 2.0  --   PHOS 9.0*  --  8.5* 8.0*  --   --  5.9*   < > = values in this interval not displayed.   GFR: Estimated Creatinine Clearance: 17.1 mL/min (A) (by C-G formula based on SCr of 3.91 mg/dL (H)). Liver Function Tests: Recent Labs  Lab 01/01/20 1100 01/01/20 1100 01/02/20 0353 01/02/20 0353 01/03/20 0305 01/03/20 2306 01/04/20 0727 01/05/20 0758 01/06/20 0054  AST 16  --  14*  --  11*  --  9* 13*  --   ALT 13  --  14  --  12  --  13 13  --   ALKPHOS 50  --  52  --  59  --  56 58  --   BILITOT 0.9  --  1.0  --  0.9  --  0.7 0.9  --   PROT 4.6*  --  4.8*  --  4.7*  --  4.6* 4.5*  --   ALBUMIN 2.0*   < > 2.1*   < > 2.0* 1.8* 1.8* 1.7* 1.8*   < > = values in this interval not displayed.   No results for input(s): LIPASE, AMYLASE in the last 168 hours. No results for input(s): AMMONIA in the last 168 hours. Coagulation Profile: Recent Labs  Lab 01/04/20 0751  INR 1.2   Cardiac Enzymes: Recent Labs  Lab 01/01/20 1100  CKTOTAL 56   BNP (last 3 results) No results for input(s): PROBNP in the last 8760 hours. HbA1C: No results for input(s): HGBA1C in the last 72 hours. CBG: Recent Labs  Lab 01/06/20 1844 01/06/20 2120 01/07/20 0535 01/07/20 0808 01/07/20 1112  GLUCAP 138* 138* 145* 152* 121*   Lipid Profile: No results for input(s): CHOL, HDL, LDLCALC, TRIG, CHOLHDL, LDLDIRECT in the last 72 hours. Thyroid Function Tests: No results for input(s): TSH, T4TOTAL, FREET4, T3FREE, THYROIDAB in the last 72 hours. Anemia Panel: No results for input(s): VITAMINB12, FOLATE, FERRITIN, TIBC, IRON, RETICCTPCT in the last 72 hours. Urine analysis:    Component Value Date/Time   COLORURINE YELLOW 12/31/2019 1420   APPEARANCEUR HAZY (A) 12/31/2019 1420   LABSPEC 1.010 12/31/2019 1420   PHURINE 5.0 12/31/2019 1420   GLUCOSEU 150 (A) 12/31/2019 1420   HGBUR MODERATE (A) 12/31/2019 1420   BILIRUBINUR NEGATIVE  12/31/2019 1420   KETONESUR NEGATIVE 12/31/2019 1420   PROTEINUR >=300 (A) 12/31/2019 1420   UROBILINOGEN 0.2 08/02/2014 1424   NITRITE NEGATIVE 12/31/2019 1420  LEUKOCYTESUR MODERATE (A) 12/31/2019 1420   Sepsis Labs: @LABRCNTIP (procalcitonin:4,lacticidven:4)  ) Recent Results (from the past 240 hour(s))  SARS Coronavirus 2 by RT PCR (hospital order, performed in [facility] hospital lab) Nasopharyngeal Nasopharyngeal Swab     Status: None   Collection Time: 12/31/19 11:15 AM   Specimen: Nasopharyngeal Swab  Result Value Ref Range Status   SARS Coronavirus 2 NEGATIVE NEGATIVE Final    Comment: (NOTE) SARS-CoV-2 target nucleic acids are NOT DETECTED.  The SARS-CoV-2 RNA is generally detectable in upper and lower respiratory specimens during the acute phase of infection. The lowest concentration of SARS-CoV-2 viral copies this assay can detect is 250 copies / mL. A negative result does not preclude SARS-CoV-2 infection and should not be used as the sole basis for treatment or other patient management decisions.  A negative result may occur with improper specimen collection / handling, submission of specimen other than nasopharyngeal swab, presence of viral mutation(s) within the areas targeted by this assay, and inadequate number of viral copies (<250 copies / mL). A negative result must be combined with clinical observations, patient history, and epidemiological information.  Fact Sheet for Patients:   StrictlyIdeas.no  Fact Sheet for Healthcare Providers: BankingDealers.co.za  This test is not yet approved or  cleared by the [place] FDA and has been authorized for detection and/or diagnosis of SARS-CoV-2 by FDA under an Emergency Use Authorization (EUA).  This EUA will remain in effect (meaning this test can be used) for the duration of the COVID-19 declaration under Section 564(b)(1) of the Act, 21 U.S.C. section  360bbb-3(b)(1), unless the authorization is terminated or revoked sooner.  Performed at [facility], [place] [address] [place], [place] [address]       Radiology Studies: No results found.   Scheduled Meds: . amiodarone  400 mg Oral BID   Followed by  . [START ON 01/13/2020] amiodarone  200 mg Oral Daily  . amLODipine  5 mg Oral Daily  . aspirin EC  81 mg Oral Daily  . atorvastatin  10 mg Oral Q M,W,F  . calcium acetate  2,001 mg Oral TID WC  . Chlorhexidine Gluconate Cloth  6 each Topical Q0600  . cholecalciferol  4,000 Units Oral Daily  . darbepoetin (ARANESP) injection - DIALYSIS  60 mcg Intravenous Q Thu-HD  . insulin aspart  0-9 Units Subcutaneous TID WC  . lipase/protease/amylase  12,000 Units Oral TID AC  . magnesium chloride  2 tablet Oral BID  . mycophenolate  360 mg Oral BID  . pantoprazole  40 mg Oral Daily  . predniSONE  5 mg Oral Q breakfast  . sertraline  50 mg Oral Daily  . tacrolimus  2 mg Oral BID  . tamsulosin  0.4 mg Oral QPC supper   Continuous Infusions: . ceFEPime (MAXIPIME) IV 2 g (01/07/20 1200)  . vancomycin       LOS: 7 days   Time Spent in minutes   30 minutes  Phala Schraeder D.O. on 01/07/2020 at 2:49 PM  Between 7am to 7pm - Please see pager noted on [email or web address]  After 7pm go to [email or web address]  And look for the night coverage person covering for me after hours  [facility] Office  [phone]

## 2020-01-07 NOTE — Progress Notes (Addendum)
[place] for Heparin Indication: atrial fibrillation  Allergies  Allergen Reactions  . Metformin And Related Other (See Comments)    Has kidney problems    Patient Measurements: Height: 6' (182.9 cm) Weight: 65.1 kg (143 lb 8.3 oz) IBW/kg (Calculated) : 77.6  Vital Signs: Temp: 98.2 F (36.8 C) (08/20 1100) Temp Source: Oral (08/20 1100) BP: 167/71 (08/20 0400) Pulse Rate: 58 (08/20 0400)  Labs: Recent Labs    01/04/20 2107 01/05/20 0758 01/05/20 0758 01/06/20 0054 01/07/20 0023  HGB  --  7.4*   < > 8.2* 7.7*  HCT  --  22.6*  --  26.1* 24.9*  PLT  --  140*  --  148* 124*  HEPARINUNFRC 0.19* 0.41  --  0.54  --   CREATININE  --  3.50*  --  3.91*  --    < > = values in this interval not displayed.    Estimated Creatinine Clearance: 17.1 mL/min (A) (by C-G formula based on SCr of 3.91 mg/dL (H)).   Medical History: Past Medical History:  Diagnosis Date  . A-fib ([place])   . Anemia    esrd  . Calciphylaxis 05/2013   lower ext  . Cancer ([place])    hx duodenal adenoCa 2002, resected  . Closed left arm fracture 1967  . Corneal abrasion, left   . Depression   . Diabetes mellitus    Type 1 iddm x 11 yrs  . Diabetic neuropathy ([place])   . ESRD on hemodialysis ([place])    Pt has ESRD due to DM.  He had a L upper arm AVF prior to starting HD which was ligated due to L arm steal syndrome.  He started HD in Jan 2014 and did home HD.  The family couldn't cannulate the R arm AVF successfully so by mid 2014 they decided to switch to PD which was done late summer 2014.  In Jan 2015 he was admitted with FTT felt to be due to underdialysis on PD and PD was abandoned and he   . GERD (gastroesophageal reflux disease)   . Glomerulosclerosis, diabetic ([place])   . Heart murmur   . History of blood transfusion   . Hypertension    off of meds due to orthostatic hypotension  . Kidney transplant recipient   . Open wound of both legs with complication     Pt has had progressive wounds of both LE's including gangrene of the toes and patchy necrosis of the calves.  He has been treated at [facility] by Dr Jerline Pain with hyperbaric O2 5d per week.  He is getting Na Thiosulfate with HD for suspected calciphylaxis. Pt says doctor's aren't sure if these ulcers were diabetic ulcers or calciphylaxis.  He underwent bilat BKA on 07/30/13.  He says that the woun    Medications:  No current facility-administered medications on file prior to encounter.   Current Outpatient Medications on File Prior to Encounter  Medication Sig Dispense Refill  . acetaminophen (TYLENOL) 500 MG tablet Take 500 mg by mouth every 6 (six) hours as needed for mild pain or moderate pain.    [name] amLODipine (NORVASC) 10 MG tablet Take 10 mg by mouth daily.    [name] aspirin 81 MG chewable tablet Chew 81 mg by mouth daily.    [name] atorvastatin (LIPITOR) 10 MG tablet Take 1 tablet (10 mg total) by mouth daily. (Patient taking differently: Take 10 mg by mouth every Monday, Wednesday, and Friday. ) 30  tablet 1  . calcitRIOL (ROCALTROL) 0.25 MCG capsule Take 0.25 mcg by mouth daily.     . calcium acetate (PHOSLO) 667 MG capsule Take 1,334 mg by mouth 3 (three) times daily with meals.    . [employer]S D3 50 MCG (2000 UT) CAPS Take 4,000 Units by mouth daily.    [name] ELIQUIS 5 MG TABS tablet TAKE 1 TABLET BY MOUTH TWICE A DAY (Patient taking differently: Take 5 mg by mouth 2 (two) times daily. ) 60 tablet 4  . feeding supplement, GLUCERNA SHAKE, (GLUCERNA SHAKE) LIQD Take 237 mLs by mouth 3 (three) times daily between meals.    [name] HUMALOG KWIKPEN 100 UNIT/ML KwikPen Inject 4-8 Units into the skin daily. Sliding Scale    . hydrALAZINE (APRESOLINE) 100 MG tablet Take 100 mg by mouth 2 (two) times daily. Afternoon, bedtime    . Insulin Glargine (BASAGLAR KWIKPEN) 100 UNIT/ML SOPN Inject 10 Units into the skin daily.     . magnesium chloride (SLOW-MAG) 64 MG TBEC SR tablet Take 2 tablets by mouth 2 (two) times daily.  Take at least 2 hours separate from tacrolimus.    . mycophenolate (MYFORTIC) 180 MG EC tablet Take 360 mg by mouth 2 (two) times daily.     [name] omeprazole (PRILOSEC) 40 MG capsule Take 40 mg by mouth at bedtime.     . predniSONE (DELTASONE) 5 MG tablet Take 5 mg by mouth daily with breakfast.     . sertraline (ZOLOFT) 50 MG tablet Take 50 mg by mouth at bedtime.     . sodium bicarbonate 650 MG tablet Take 1,950 mg by mouth 4 (four) times daily.    . tacrolimus (PROGRAF) 1 MG capsule Take 2-3 mg by mouth See admin instructions. Take 3 mg by mouth in the morning at 9 AM and 2 mg in the evening at 9 PM    . tamsulosin (FLOMAX) 0.4 MG CAPS capsule Take 0.4 mg by mouth daily after supper.        Assessment: 55 yoM on apixaban PTA for hx AFib transitioned to IV heparin. Heparin was stopped 8/20 due to multiple nose bleeds (improved). Plans are to restart heparin. Possible surgery (revision transtibial amputation) next week -Hg= 7.7   Goal of Therapy:  Heparin level 0.3-0.7 units/ml Monitor platelets by anticoagulation protocol: Yes   Plan:  -restart heparin at 1350 units/hr -Heparin level in 8 hours and daily wth CBC daily  [name], PharmD Clinical Pharmacist **Pharmacist phone directory can now be found on [email or web address] ([email or web address]).  Listed under [place].

## 2020-01-07 NOTE — Plan of Care (Signed)
  Problem: Education: Goal: Knowledge of General Education information will improve Description: Including pain rating scale, medication(s)/side effects and non-pharmacologic comfort measures Outcome: Progressing   Problem: Clinical Measurements: Goal: Will remain free from infection Outcome: Progressing Goal: Diagnostic test results will improve Outcome: Progressing Goal: Cardiovascular complication will be avoided Outcome: Progressing   Problem: Coping: Goal: Level of anxiety will decrease Outcome: Progressing   

## 2020-01-07 NOTE — Progress Notes (Signed)
[facility] Progress Note   64 y.o.maleDDRT 2015 CKD4, AoCKD Afib DM HTN b/l BKA h/o duodenal adenocarcinoma s/p resection. 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 Currently on Myfortic, Tac and prednisone. Baseline Cr is2.8-3.4(as recently as 10/12/2019) but appears to be slowly worsening as an outpatient. Patient is here with leg pain and states that the pain to the right leg stump has worsened over the past few days but denies any fever.  Now with subsequent renal failure  Assessment/ Plan:   1. AKI on CKD4 now ESRD: BL cr 2.8-3.4 s/p DDRT with DGF complicated by ACR and antibody mediated rejection requiring IVIG as recently as 2018.  Unfortunately with minimal urine output and creatinine not improving.  Given the patient's failure to improve and significant baseline kidney dysfunction I think he is end-stage renal disease dependent on dialysis. 1. Continue dialysis TTS schedule-Next session of dialysis on Saturday 2. Continue tacrolimus, mycophenolate, prednisone-tac tapered to 2/2 on 8/18 3. Continue with clip process -> likely to go back to previous center when medically ready 4. Patient has functional dialysis access 2. DM -historically poorly controlled. Per primary. 3. HTN -BP fluctuating.  Continue Norvasc 5 mg daily 4. Pancreatic insufficiency -continue Creon 5. Hyperphosphatemia: Continue PhosLo when eating.  6. Anemia: Hemoglobin 8.2.  Ferritin 657 and iron sat 81.  Iron not an option. aranesp 46mcg weekly starting 01/06/2019  Subjective:   Tired today.  No complaints.  Tolerated dialysis with 1 L of ultrafiltration.  On IV tacrolimus.  Not eating very much.   Objective:   BP (!) 167/71   Pulse (!) 58   Temp 98.2 F (36.8 C) (Oral)   Resp 12   Ht 6' (1.829 m)   Wt 65.1 kg   SpO2 96%   BMI 19.46 kg/m   Intake/Output Summary (Last 24 hours) at 01/07/2020 1040 Last data filed at 01/07/2020 0600 Gross per 24 hour  Intake 29.12 ml   Output 1300 ml  Net -1270.88 ml   Weight change:   Physical Exam: GEN: NAD, lying in bed HEENT: No nasal discharge, moist mucous membranes LUNGS: Bilateral chest rise, no increased work of breathing CV: Normal rate, no upper extremity edema ABD: Positive bowel sounds, no distention EXT: B/L BKA, warm ACCESS: Rt Cimino with good bruit  Imaging: No results found.  Labs: BMET Recent Labs  Lab 01/01/20 1100 01/02/20 0353 01/03/20 0305 01/03/20 2306 01/04/20 0727 01/05/20 0758 01/06/20 0054  NA 143 143 144 146* 144 140 140  K 2.6* 3.6 3.1* 3.0* 4.1 3.6 4.1  CL 116* 116* 106 115* 111 104 105  CO2 10* 8* 22 13* 16* 25 21*  GLUCOSE 186* 260* 204* 194* 240* 151* 130*  BUN 93* 97* 89* 92* 102* 41* 49*  CREATININE 5.29* 5.50* 5.35* 5.45* 6.11* 3.50* 3.91*  CALCIUM 7.7* 7.9* 7.3* 7.2* 7.7* 7.9* 8.1*  PHOS  --  9.0* 8.5* 8.0*  --   --  5.9*   CBC Recent Labs  Lab 01/04/20 0727 01/05/20 0758 01/06/20 0054 01/07/20 0023  WBC 8.6 8.8 9.4 8.2  HGB 7.6* 7.4* 8.2* 7.7*  HCT 24.1* 22.6* 26.1* 24.9*  MCV 90.9 89.7 90.9 89.2  PLT 169 140* 148* 124*    Medications:    . amiodarone  400 mg Oral BID   Followed by  . [START ON 01/13/2020] amiodarone  200 mg Oral Daily  . amLODipine  5 mg Oral Daily  . aspirin EC  81 mg Oral Daily  .  atorvastatin  10 mg Oral Q M,W,F  . calcium acetate  2,001 mg Oral TID WC  . Chlorhexidine Gluconate Cloth  6 each Topical Q0600  . cholecalciferol  4,000 Units Oral Daily  . darbepoetin (ARANESP) injection - DIALYSIS  60 mcg Intravenous Q Thu-HD  . insulin aspart  0-9 Units Subcutaneous TID WC  . lipase/protease/amylase  12,000 Units Oral TID AC  . magnesium chloride  2 tablet Oral BID  . mycophenolate  360 mg Oral BID  . pantoprazole  40 mg Oral Daily  . predniSONE  5 mg Oral Q breakfast  . sertraline  50 mg Oral Daily  . tacrolimus  2 mg Oral BID  . tamsulosin  0.4 mg Oral QPC supper      Reesa Chew  01/07/2020, 10:40 AM

## 2020-01-08 LAB — GLUCOSE, CAPILLARY
Glucose-Capillary: 226 mg/dL — ABNORMAL HIGH (ref 70–99)
Glucose-Capillary: 215 mg/dL — ABNORMAL HIGH (ref 70–99)
Glucose-Capillary: 225 mg/dL — ABNORMAL HIGH (ref 70–99)
Glucose-Capillary: 276 mg/dL — ABNORMAL HIGH (ref 70–99)

## 2020-01-08 LAB — RENAL FUNCTION PANEL
Albumin: 1.7 g/dL — ABNORMAL LOW (ref 3.5–5.0)
Anion gap: 15 (ref 5–15)
BUN: 39 mg/dL — ABNORMAL HIGH (ref 8–23)
CO2: 20 mmol/L — ABNORMAL LOW (ref 22–32)
Calcium: 8.3 mg/dL — ABNORMAL LOW (ref 8.9–10.3)
Chloride: 101 mmol/L (ref 98–111)
Creatinine, Ser: 3.59 mg/dL — ABNORMAL HIGH (ref 0.61–1.24)
GFR calc Af Amer: 19 mL/min — ABNORMAL LOW (ref >60)
GFR calc non Af Amer: 17 mL/min — ABNORMAL LOW (ref >60)
Glucose, Bld: 230 mg/dL — ABNORMAL HIGH (ref 70–99)
Phosphorus: 3.7 mg/dL (ref 2.5–4.6)
Potassium: 3.6 mmol/L (ref 3.5–5.1)
Sodium: 136 mmol/L (ref 135–145)

## 2020-01-08 LAB — CBC
HCT: 26.1 % — ABNORMAL LOW (ref 39.0–52.0)
Hemoglobin: 8.1 g/dL — ABNORMAL LOW (ref 13.0–17.0)
MCH: 28.5 pg (ref 26.0–34.0)
MCHC: 31 g/dL (ref 30.0–36.0)
MCV: 91.9 fL (ref 80.0–100.0)
Platelets: 125 10*3/uL — ABNORMAL LOW (ref 150–400)
RBC: 2.84 MIL/uL — ABNORMAL LOW (ref 4.22–5.81)
RDW: 14.5 % (ref 11.5–15.5)
WBC: 10 10*3/uL (ref 4.0–10.5)
nRBC: 0 % (ref 0.0–0.2)

## 2020-01-08 LAB — HEPARIN LEVEL (UNFRACTIONATED): Heparin Unfractionated: 0.38 IU/mL (ref 0.30–0.70)

## 2020-01-08 LAB — VANCOMYCIN, TROUGH
Vancomycin Tr: 4 ug/mL — ABNORMAL LOW (ref 15–20)
Vancomycin Tr: 12 ug/mL — ABNORMAL LOW (ref 15–20)

## 2020-01-08 MED ORDER — VANCOMYCIN HCL IN DEXTROSE 1-5 GM/200ML-% IV SOLN
1000.0000 mg | INTRAVENOUS | Status: AC
  Administered 2020-01-14: 1000 mg via INTRAVENOUS
  Filled 2020-01-08 (×2): qty 200

## 2020-01-08 MED ORDER — VANCOMYCIN HCL IN DEXTROSE 750-5 MG/150ML-% IV SOLN
INTRAVENOUS | Status: AC
  Filled 2020-01-08: qty 150

## 2020-01-08 NOTE — Progress Notes (Signed)
PROGRESS NOTE    Paul [name]  KPT:[id number] DOB: Oct 16, 1955 DOA: 12/30/2019 PCP: [name], MD   Brief Narrative:  HPI on 12/31/2019 by Dr. Wynetta Fines Paul [name] is a 64 y.o. male with medical history significant of kidney transplant recipient (2016) with CKD stage IV, hypertension, IDDM, chronic A. fib on Xarelto, B/L BKA secondary to PVD, presented with new onset of right BKA stump pain and rash.  Symptoms started about 1 week ago, patient attributed that to a unfit prosthetic which might caused a skin abrasions.  He started to notice significant pain rash associated the end of right leg stump about 3 days ago.  Pain has been constant and gradually getting worse, denies any fever chills.  He took some Tylenol for the pain without any relief.  Patient has been following with [facility] for the kidney transplant, he was seen in the office about 2 weeks ago and multiple blood work and urine analysis sent, urine protein/Cre>5,000, patient however was not informed about results yet.  And patient claims his kidney function has been stable, denies any back pain, decreased urine output or urine color.  Interim history  Patient was admitted with sepsis secondary to right leg osteomyelitis with an abscess on BKA stump.  She was placed on IV antibiotics with vancomycin and cefepime.  Patient will need incision and drainage.  Also noted to have atrial fibrillation with RVR, cardiology consulted.  Nephrology consulted patient is having AKI on CKD, stage IV. Pending revision of amputation next week.   Assessment & Plan   Severe sepsis secondary to right leg osteomyelitis and abscess on BKA stump site -Sepsis was present on admission -CT scan of lower extremity showed evidence of early osteomyelitis and possible abscess of source of infection -Continue vancomycin and cefepime -Orthopedic surgery consulted and appreciated, recommended MRI of the affected extremity-which showed no  findings of osteomyelitis in the distal femur, patella or 60 m of tibia.  Unfortunately patient had cooperation issues therefore the distal 2 cm of the tibia/fibula and overlying stump were not included in the imaging, especially hyperdense collection noted on CT scan on 12/31/2019.  Small knee effusion, skeptical of septic joint given lack of synovitis. -CRP mildly elevated -Patient may need incision and drainage -Orthopedics, Dr. Sharol Given, recommending revision of R BKA when patient is medically stable - likely next week  Acute metabolic encephalopathy -likely secondary to the above -Resolved, patient currently alert and oriented x3  Atrial fibrillation with RVR -Patient with chronic atrial fibrillation -Noted to have tachycardia with hypotension -Currently in sinus rhythm  -Cardiology consulted and appreciated -was placed on amiodarone drip- cardiology is transitioning to oral for 1 months course, starting with 400mg  BID for 7 days, followed by 200mg  for 21 days (starting 8/19) -Eliquis has been held, currently on heparin drip  Acute kidney injury on chronic kidney disease, stage IV with severe metabolic acidosis  -history of renal transplant -Nephrology consulted and appreciated, patient tolerated HD -Patient received bicarb for metabolic acidosis -Continue tacrolimus, prednisone, mycophenolate- will speak to pharmacy about changing these medications to IV -Patient dialyzing today -Creatinine 3.59 -Continue to monitor BMP  Hypokalemia -Currently 3.6, will continue to monitor and replace as needed  Diabetes mellitus, type II, uncontrolled with hyperglycemia -Hemoglobin A1c 10.3 -patient is on low-dose prednisone -Continue insulin sliding scale and CBG monitoring  Mild hypomagnesemia  -Resolved with replacement, continue to monitor and replace as needed  History of pancreatic insufficiency -Patient had duodenal adenocarcinoma status post  resection -Continue Creon which was added  on 01/01/2020  Essential hypertension -Patient was mildly hypotensive -Clonidine held -Continue hydralazine with holding parameters  Hyperlipidemia -continue statin  Anemia of chronic disease -Hemoglobin 8.1 today, continue to monitor CBC and transfuse if needed  DVT Prophylaxis Heparin drip  Code Status: Full  Family Communication: None at bedside  Disposition Plan:  Status is: Inpatient  Remains inpatient appropriate because:Hemodynamically unstable, Persistent severe electrolyte disturbances, Ongoing diagnostic testing needed not appropriate for outpatient work up and Inpatient level of care appropriate due to severity of illness   Dispo:  Patient From: Home  Planned Disposition: Home with Health Care Svc  Expected discharge date: TBD  Medically stable for discharge: No   Consultants Orthopedic surgery Cardiology Nephrology  Procedures  None  Antibiotics   Anti-infectives (From admission, onward)   Start     Dose/Rate Route Frequency Ordered Stop   01/06/20 1200  vancomycin (VANCOCIN) IVPB 750 mg/150 ml premix        750 mg 150 mL/hr over 60 Minutes Intravenous Every T-Th-Sa (Hemodialysis) 01/05/20 1431     01/04/20 1130  vancomycin (VANCOCIN) 1-5 GM/200ML-% IVPB       Note to Pharmacy: Murriel Hopper   : cabinet override      01/04/20 1130 01/04/20 1333   12/31/19 1400  vancomycin (VANCOCIN) IVPB 1000 mg/200 mL premix  Status:  Discontinued        1,000 mg 200 mL/hr over 60 Minutes Intravenous Every 48 hours 12/31/19 1347 01/05/20 1431   12/31/19 1345  ceFEPIme (MAXIPIME) 2 g in sodium chloride 0.9 % 100 mL IVPB        2 g 200 mL/hr over 30 Minutes Intravenous Every 24 hours 12/31/19 1347     12/31/19 1330  vancomycin (VANCOCIN) IVPB 1000 mg/200 mL premix  Status:  Discontinued        1,000 mg 200 mL/hr over 60 Minutes Intravenous  Once 12/31/19 1315 12/31/19 1347      Subjective:   Paul [name] seen and examined today.  Patient asked if he was dying  this morning.  He wonders if he is actually getting better or worse.  Patient denies any chest pain or shortness of breath, abdominal pain, nausea or vomiting, diarrhea or constipation.  Wondering when Dr. Sharol Given will operate on him.   Objective:   Vitals:   01/08/20 0400 01/08/20 0500 01/08/20 0600 01/08/20 0800  BP: (!) 159/66 (!) 162/69 (!) 163/72 128/61  Pulse: (!) 58 (!) 59 (!) 58 64  Resp:    18  Temp:    98.1 F (36.7 C)  TempSrc:    Oral  SpO2: 100% 97% 99% 96%  Weight:      Height:        Intake/Output Summary (Last 24 hours) at 01/08/2020 0854 Last data filed at 01/08/2020 0600 Gross per 24 hour  Intake 827.79 ml  Output 675 ml  Net 152.79 ml   Filed Weights   01/05/20 0628 01/06/20 1440 01/06/20 1815  Weight: 65.1 kg 66 kg 65.1 kg   Exam  General: Well developed, chronically ill-appearing, NAD  HEENT: NCAT,mucous membranes moist.   Cardiovascular: S1 S2 auscultated, RRR, SEM  Respiratory: Clear to auscultation bilaterally with equal chest rise  Abdomen: Soft, nontender, nondistended, + bowel sounds  Extremities: Bilateral BKA.  Right stump with dressing.  Neuro: AAOx3, nonfocal  Psych: appropriate mood and affect, pleasant  Data Reviewed: I have personally reviewed following labs and imaging studies  CBC: Recent  Labs  Lab 01/04/20 0727 01/05/20 0758 01/06/20 0054 01/07/20 0023 01/08/20 0534  WBC 8.6 8.8 9.4 8.2 10.0  HGB 7.6* 7.4* 8.2* 7.7* 8.1*  HCT 24.1* 22.6* 26.1* 24.9* 26.1*  MCV 90.9 89.7 90.9 89.2 91.9  PLT 169 140* 148* 124* 517*   Basic Metabolic Panel: Recent Labs  Lab 01/02/20 0353 01/02/20 0353 01/03/20 0305 01/03/20 0305 01/03/20 2306 01/04/20 0727 01/05/20 0758 01/06/20 0054 01/08/20 0534  NA 143   < > 144   < > 146* 144 140 140 136  K 3.6   < > 3.1*   < > 3.0* 4.1 3.6 4.1 3.6  CL 116*   < > 106   < > 115* 111 104 105 101  CO2 8*   < > 22   < > 13* 16* 25 21* 20*  GLUCOSE 260*   < > 204*   < > 194* 240* 151* 130* 230*   BUN 97*   < > 89*   < > 92* 102* 41* 49* 39*  CREATININE 5.50*   < > 5.35*   < > 5.45* 6.11* 3.50* 3.91* 3.59*  CALCIUM 7.9*   < > 7.3*   < > 7.2* 7.7* 7.9* 8.1* 8.3*  MG 1.6*  --  1.6*  --  1.7 2.3 2.0  --   --   PHOS 9.0*  --  8.5*  --  8.0*  --   --  5.9* 3.7   < > = values in this interval not displayed.   GFR: Estimated Creatinine Clearance: 18.6 mL/min (A) (by C-G formula based on SCr of 3.59 mg/dL (H)). Liver Function Tests: Recent Labs  Lab 01/01/20 1100 01/01/20 1100 01/02/20 0353 01/02/20 0353 01/03/20 0305 01/03/20 0305 01/03/20 2306 01/04/20 0727 01/05/20 0758 01/06/20 0054 01/08/20 0534  AST 16  --  14*  --  11*  --   --  9* 13*  --   --   ALT 13  --  14  --  12  --   --  13 13  --   --   ALKPHOS 50  --  52  --  59  --   --  56 58  --   --   BILITOT 0.9  --  1.0  --  0.9  --   --  0.7 0.9  --   --   PROT 4.6*  --  4.8*  --  4.7*  --   --  4.6* 4.5*  --   --   ALBUMIN 2.0*   < > 2.1*   < > 2.0*   < > 1.8* 1.8* 1.7* 1.8* 1.7*   < > = values in this interval not displayed.   No results for input(s): LIPASE, AMYLASE in the last 168 hours. No results for input(s): AMMONIA in the last 168 hours. Coagulation Profile: Recent Labs  Lab 01/04/20 0751  INR 1.2   Cardiac Enzymes: Recent Labs  Lab 01/01/20 1100  CKTOTAL 56   BNP (last 3 results) No results for input(s): PROBNP in the last 8760 hours. HbA1C: No results for input(s): HGBA1C in the last 72 hours. CBG: Recent Labs  Lab 01/07/20 0808 01/07/20 1112 01/07/20 1624 01/07/20 2122 01/08/20 0609  GLUCAP 152* 121* 203* 198* 226*   Lipid Profile: No results for input(s): CHOL, HDL, LDLCALC, TRIG, CHOLHDL, LDLDIRECT in the last 72 hours. Thyroid Function Tests: No results for input(s): TSH, T4TOTAL, FREET4, T3FREE, THYROIDAB in the last 72 hours. Anemia  Panel: No results for input(s): VITAMINB12, FOLATE, FERRITIN, TIBC, IRON, RETICCTPCT in the last 72 hours. Urine analysis:    Component Value  Date/Time   COLORURINE YELLOW 12/31/2019 1420   APPEARANCEUR HAZY (A) 12/31/2019 1420   LABSPEC 1.010 12/31/2019 1420   PHURINE 5.0 12/31/2019 1420   GLUCOSEU 150 (A) 12/31/2019 1420   HGBUR MODERATE (A) 12/31/2019 1420   BILIRUBINUR NEGATIVE 12/31/2019 1420   KETONESUR NEGATIVE 12/31/2019 1420   PROTEINUR >=300 (A) 12/31/2019 1420   UROBILINOGEN 0.2 08/02/2014 1424   NITRITE NEGATIVE 12/31/2019 1420   LEUKOCYTESUR MODERATE (A) 12/31/2019 1420   Sepsis Labs: @LABRCNTIP (procalcitonin:4,lacticidven:4)  ) Recent Results (from the past 240 hour(s))  SARS Coronavirus 2 by RT PCR (hospital order, performed in [facility] hospital lab) Nasopharyngeal Nasopharyngeal Swab     Status: None   Collection Time: 12/31/19 11:15 AM   Specimen: Nasopharyngeal Swab  Result Value Ref Range Status   SARS Coronavirus 2 NEGATIVE NEGATIVE Final    Comment: (NOTE) SARS-CoV-2 target nucleic acids are NOT DETECTED.  The SARS-CoV-2 RNA is generally detectable in upper and lower respiratory specimens during the acute phase of infection. The lowest concentration of SARS-CoV-2 viral copies this assay can detect is 250 copies / mL. A negative result does not preclude SARS-CoV-2 infection and should not be used as the sole basis for treatment or other patient management decisions.  A negative result may occur with improper specimen collection / handling, submission of specimen other than nasopharyngeal swab, presence of viral mutation(s) within the areas targeted by this assay, and inadequate number of viral copies (<250 copies / mL). A negative result must be combined with clinical observations, patient history, and epidemiological information.  Fact Sheet for Patients:   StrictlyIdeas.no  Fact Sheet for Healthcare Providers: BankingDealers.co.za  This test is not yet approved or  cleared by the [place] FDA and has been authorized for detection  and/or diagnosis of SARS-CoV-2 by FDA under an Emergency Use Authorization (EUA).  This EUA will remain in effect (meaning this test can be used) for the duration of the COVID-19 declaration under Section 564(b)(1) of the Act, 21 U.S.C. section 360bbb-3(b)(1), unless the authorization is terminated or revoked sooner.  Performed at [facility], [place] [address], [place], [place] [address]       Radiology Studies: No results found.   Scheduled Meds: . amiodarone  400 mg Oral BID   Followed by  . [START ON 01/13/2020] amiodarone  200 mg Oral Daily  . amLODipine  5 mg Oral Daily  . aspirin EC  81 mg Oral Daily  . atorvastatin  10 mg Oral Q M,W,F  . calcium acetate  2,001 mg Oral TID WC  . Chlorhexidine Gluconate Cloth  6 each Topical Q0600  . cholecalciferol  4,000 Units Oral Daily  . darbepoetin (ARANESP) injection - DIALYSIS  60 mcg Intravenous Q Thu-HD  . insulin aspart  0-9 Units Subcutaneous TID WC  . lipase/protease/amylase  12,000 Units Oral TID AC  . magnesium chloride  2 tablet Oral BID  . mycophenolate  360 mg Oral BID  . pantoprazole  40 mg Oral Daily  . predniSONE  5 mg Oral Q breakfast  . sertraline  50 mg Oral Daily  . tacrolimus  2 mg Oral BID  . tamsulosin  0.4 mg Oral QPC supper   Continuous Infusions: . ceFEPime (MAXIPIME) IV 2 g (01/07/20 1200)  . heparin 1,350 Units/hr (01/08/20 0600)  . vancomycin  LOS: 8 days   Time Spent in minutes   30 minutes  Miriya Cloer D.O. on 01/08/2020 at 8:54 AM  Between 7am to 7pm - Please see pager noted on [email or web address]  After 7pm go to [email or web address]  And look for the night coverage person covering for me after hours  [facility] Office  [phone]

## 2020-01-08 NOTE — Progress Notes (Addendum)
[facility] for Heparin Indication: atrial fibrillation  Allergies  Allergen Reactions  . Metformin And Related Other (See Comments)    Has kidney problems    Patient Measurements: Height: 6' (182.9 cm) Weight: 65.1 kg (143 lb 8.3 oz) IBW/kg (Calculated) : 77.6  Vital Signs: Temp: 98.4 F (36.9 C) (08/20 2300) Temp Source: Oral (08/20 2300) BP: 148/66 (08/20 2300) Pulse Rate: 58 (08/20 2300)  Labs: Recent Labs    01/05/20 0758 01/05/20 0758 01/06/20 0054 01/07/20 0023 01/07/20 2324  HGB 7.4*   < > 8.2* 7.7*  --   HCT 22.6*  --  26.1* 24.9*  --   PLT 140*  --  148* 124*  --   HEPARINUNFRC 0.41  --  0.54  --  0.50  CREATININE 3.50*  --  3.91*  --   --    < > = values in this interval not displayed.    Estimated Creatinine Clearance: 17.1 mL/min (A) (by C-G formula based on SCr of 3.91 mg/dL (H)).  Assessment: 67 yoM on apixaban PTA for hx AFib transitioned to IV heparin. Heparin was stopped 8/20 due to multiple nose bleeds (improved). Plans are to restart heparin. Possible surgery (revision transtibial amputation) next week.  Heparin level is therapeutic at 0.5 on 1350 units/hr. No bleeding noted.   Goal of Therapy:  Heparin level 0.3-0.7 units/ml Monitor platelets by anticoagulation protocol: Yes   Plan:  -Continue heparin drip at 1350 units/hr -F/U heparin level in am -Daily heparin level, CBC -Monitor for s/sx of bleeding  Thank you for involving pharmacy in this patient's care.  Renold Genta, PharmD, BCPS Clinical Pharmacist 01/08/2020 12:38 AM  **Pharmacist phone directory can be found on [email or web address] listed under [place]**  Addendum: Heparin level is therapeutic at 0.38 on 1350 units/hr. Continue this rate.  Renold Genta, PharmD, BCPS 6:29 AM

## 2020-01-08 NOTE — Progress Notes (Signed)
Pharmacy Antibiotic Note  Paul [name] is a 64 y.o. male admitted on 12/30/2019 with osteomyelitis and has a history of CKD4, kidney transplant . Pharmacy has been consulted for vancomycin and cefepime dosing. He is noted with ESRD on HD and plans for R BKA revision next week.  -HD completed 8/21 - vanc given at ~ 11am; HD finished ~ 11:30 -8/21: vanc level= 4 (11:42)  -8/21: vanc level= 12 (12:50)  The Vanc level is post HD but also post dose and likely has not completed the distribution phase (falsely low). With the initial level of 4 will need to increase  Plan: - Vancomycin 1000 mg IV MWF - Target a goal vancomycin trough of 15-20 mcg/mL - Continue Cefepime 2 g q24h  - Monitor renal function, clinical status, cultures and length of therapy - Deescalate therapy as needed   Height: 6' (182.9 cm) Weight: 66.7 kg (147 lb 0.8 oz) IBW/kg (Calculated) : 77.6  Temp (24hrs), Avg:98.4 F (36.9 C), Min:98.1 F (36.7 C), Max:98.8 F (37.1 C)  Recent Labs  Lab 01/03/20 0305 01/03/20 2306 01/04/20 0727 01/05/20 0758 01/06/20 0054 01/07/20 0023 01/08/20 0534 01/08/20 1142 01/08/20 1250  WBC   < >  --  8.6 8.8 9.4 8.2 10.0  --   --   CREATININE  --  5.45* 6.11* 3.50* 3.91*  --  3.59*  --   --   VANCOTROUGH  --   --   --   --   --   --   --  4* 12*   < > = values in this interval not displayed.    Estimated Creatinine Clearance: 19.1 mL/min (A) (by C-G formula based on SCr of 3.59 mg/dL (H)).    Allergies  Allergen Reactions  . Metformin And Related Other (See Comments)    Has kidney problems    Antimicrobials this admission: 8/13 vancomycin >> 8/13 cefepime >>  Microbiology results: None  [name], PharmD Clinical Pharmacist **Pharmacist phone directory can now be found on [email or web address] ([email or web address]).  Listed under [place].      [name]

## 2020-01-08 NOTE — Progress Notes (Signed)
[facility] Progress Note   64 y.o.maleDDRT 2015 CKD4, AoCKD Afib DM HTN b/l BKA h/o duodenal adenocarcinoma s/p resection. 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 Currently on Myfortic, Tac and prednisone. Baseline Cr is2.8-3.4(as recently as 10/12/2019) but appears to be slowly worsening as an outpatient. Patient is here with leg pain and states that the pain to the right leg stump has worsened over the past few days but denies any fever.  Now with subsequent renal failure  Assessment/ Plan:   1. AKI on CKD4 now ESRD: BL cr 2.8-3.4 s/p DDRT with DGF complicated by ACR and antibody mediated rejection requiring IVIG as recently as 2018.  Unfortunately with minimal urine output and creatinine not improving.  Given the patient's failure to improve and significant baseline kidney dysfunction I think he is end-stage renal disease dependent on dialysis. 1. Continue dialysis TTS schedule-dialysis today 2. Continue tacrolimus, mycophenolate, prednisone-tac tapered to 2/2 on 8/18 3. Continue with clip process -> has outpatient dialysis set up for TTS at [facility] 4. Patient has functional dialysis access 2. DM -historically poorly controlled. Per primary. 3. HTN -BP fluctuating.  Continue Norvasc 5 mg daily 4. Pancreatic insufficiency -continue Creon 5. Hyperphosphatemia: Continue PhosLo when eating.  6. Anemia: Hemoglobin 8.2.  Ferritin 657 and iron sat 81.  Iron not an option. aranesp 55mcg weekly starting 01/06/2019  Subjective:   Patient doing much better today.  Full conversation and able to answer all questions.  Denies any complaints.  Has been tolerating dialysis without any issues.  Eating well.   Objective:   BP 123/63 (BP Location: Left Arm)   Pulse 64   Temp 98.8 F (37.1 C) (Oral)   Resp 18   Ht 6' (1.829 m)   Wt 66.7 kg   SpO2 96%   BMI 19.94 kg/m   Intake/Output Summary (Last 24 hours) at 01/08/2020 1015 Last data filed at 01/08/2020  0600 Gross per 24 hour  Intake 827.79 ml  Output 675 ml  Net 152.79 ml   Weight change:   Physical Exam: GEN: NAD, lying in bed HEENT: No nasal discharge, moist mucous membranes LUNGS: Bilateral chest rise, no increased work of breathing CV: Normal rate, no upper extremity edema ABD: Positive bowel sounds, no distention EXT: B/L BKA, warm ACCESS: Rt Cimino with good bruit  Imaging: No results found.  Labs: BMET Recent Labs  Lab 01/02/20 0353 01/03/20 0305 01/03/20 2306 01/04/20 0727 01/05/20 0758 01/06/20 0054 01/08/20 0534  NA 143 144 146* 144 140 140 136  K 3.6 3.1* 3.0* 4.1 3.6 4.1 3.6  CL 116* 106 115* 111 104 105 101  CO2 8* 22 13* 16* 25 21* 20*  GLUCOSE 260* 204* 194* 240* 151* 130* 230*  BUN 97* 89* 92* 102* 41* 49* 39*  CREATININE 5.50* 5.35* 5.45* 6.11* 3.50* 3.91* 3.59*  CALCIUM 7.9* 7.3* 7.2* 7.7* 7.9* 8.1* 8.3*  PHOS 9.0* 8.5* 8.0*  --   --  5.9* 3.7   CBC Recent Labs  Lab 01/05/20 0758 01/06/20 0054 01/07/20 0023 01/08/20 0534  WBC 8.8 9.4 8.2 10.0  HGB 7.4* 8.2* 7.7* 8.1*  HCT 22.6* 26.1* 24.9* 26.1*  MCV 89.7 90.9 89.2 91.9  PLT 140* 148* 124* 125*    Medications:    . amiodarone  400 mg Oral BID   Followed by  . [START ON 01/13/2020] amiodarone  200 mg Oral Daily  . amLODipine  5 mg Oral Daily  . aspirin EC  81 mg Oral  Daily  . atorvastatin  10 mg Oral Q M,W,F  . calcium acetate  2,001 mg Oral TID WC  . Chlorhexidine Gluconate Cloth  6 each Topical Q0600  . cholecalciferol  4,000 Units Oral Daily  . darbepoetin (ARANESP) injection - DIALYSIS  60 mcg Intravenous Q Thu-HD  . insulin aspart  0-9 Units Subcutaneous TID WC  . lipase/protease/amylase  12,000 Units Oral TID AC  . magnesium chloride  2 tablet Oral BID  . mycophenolate  360 mg Oral BID  . pantoprazole  40 mg Oral Daily  . predniSONE  5 mg Oral Q breakfast  . sertraline  50 mg Oral Daily  . tacrolimus  2 mg Oral BID  . tamsulosin  0.4 mg Oral QPC supper      Reesa Chew  01/08/2020, 10:15 AM

## 2020-01-09 LAB — BASIC METABOLIC PANEL
Anion gap: 6 (ref 5–15)
BUN: 21 mg/dL (ref 8–23)
CO2: 27 mmol/L (ref 22–32)
Calcium: 7.9 mg/dL — ABNORMAL LOW (ref 8.9–10.3)
Chloride: 101 mmol/L (ref 98–111)
Creatinine, Ser: 2.29 mg/dL — ABNORMAL HIGH (ref 0.61–1.24)
GFR calc Af Amer: 33 mL/min — ABNORMAL LOW (ref >60)
GFR calc non Af Amer: 29 mL/min — ABNORMAL LOW (ref >60)
Glucose, Bld: 311 mg/dL — ABNORMAL HIGH (ref 70–99)
Potassium: 4.4 mmol/L (ref 3.5–5.1)
Sodium: 134 mmol/L — ABNORMAL LOW (ref 135–145)

## 2020-01-09 LAB — GLUCOSE, CAPILLARY
Glucose-Capillary: 270 mg/dL — ABNORMAL HIGH (ref 70–99)
Glucose-Capillary: 263 mg/dL — ABNORMAL HIGH (ref 70–99)
Glucose-Capillary: 343 mg/dL — ABNORMAL HIGH (ref 70–99)
Glucose-Capillary: 394 mg/dL — ABNORMAL HIGH (ref 70–99)

## 2020-01-09 LAB — HEMOGLOBIN AND HEMATOCRIT, BLOOD
HCT: 24.7 % — ABNORMAL LOW (ref 39.0–52.0)
Hemoglobin: 7.5 g/dL — ABNORMAL LOW (ref 13.0–17.0)

## 2020-01-09 LAB — MAGNESIUM: Magnesium: 2 mg/dL (ref 1.7–2.4)

## 2020-01-09 LAB — HEPARIN LEVEL (UNFRACTIONATED): Heparin Unfractionated: 0.46 IU/mL (ref 0.30–0.70)

## 2020-01-09 MED ORDER — MYCOPHENOLATE SODIUM 180 MG PO TBEC
180.0000 mg | DELAYED_RELEASE_TABLET | Freq: Two times a day (BID) | ORAL | Status: DC
  Administered 2020-01-09 – 2020-01-18 (×18): 180 mg via ORAL
  Filled 2020-01-09 (×19): qty 1

## 2020-01-09 NOTE — Progress Notes (Signed)
[facility] Progress Note   64 y.o.maleDDRT 2015 CKD4, AoCKD Afib DM HTN b/l BKA h/o duodenal adenocarcinoma s/p resection. 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 Currently on Myfortic, Tac and prednisone. Baseline Cr is2.8-3.4(as recently as 10/12/2019) but appears to be slowly worsening as an outpatient. Patient is here with leg pain and states that the pain to the right leg stump has worsened over the past few days but denies any fever.  Now with subsequent renal failure  Assessment/ Plan:   1. AKI on CKD4 now ESRD: BL cr 2.8-3.4 s/p DDRT with DGF complicated by ACR and antibody mediated rejection requiring IVIG as recently as 2018.  Unfortunately with minimal urine output and creatinine not improving.  Given the patient's failure to improve and significant baseline kidney dysfunction I think he is end-stage renal disease dependent on dialysis. 1. Continue dialysis TTS schedule-dialysis next on Tuesday 2. Continue tacrolimus, mycophenolate, prednisone-tac tapered to 2/2 on 8/18 -> mycophenolate tapered to 180 mg twice daily on 8/22 3.  has outpatient dialysis set up for TTS at [facility] 4. Patient has functional dialysis access 2. DM -historically poorly controlled. Per primary. 3. HTN -continue amlodipine 5 mg daily 4. Pancreatic insufficiency -continue Creon 5. Hyperphosphatemia: Continue PhosLo when eating.  6. Anemia: Hemoglobin 8.2.  Ferritin 657 and iron sat 81.  Iron not an option. aranesp 84mcg weekly starting 01/06/2019  Subjective:   Patient feeling well this morning without any complaints.  Appetite good.   Objective:   BP 124/64 (BP Location: Left Arm)   Pulse (!) 57   Temp 97.8 F (36.6 C) (Oral)   Resp 13   Ht 6' (1.829 m)   Wt 65.9 kg   SpO2 98%   BMI 19.70 kg/m   Intake/Output Summary (Last 24 hours) at 01/09/2020 4696 Last data filed at 01/09/2020 0600 Gross per 24 hour  Intake 839.61 ml  Output 1350 ml  Net -510.39 ml    Weight change:   Physical Exam: GEN: NAD, lying in bed HEENT: No nasal discharge, moist mucous membranes LUNGS: Bilateral chest rise, no increased work of breathing CV: Normal rate, no upper extremity edema ABD: Positive bowel sounds, no distention EXT: B/L BKA, warm ACCESS: Rt Cimino with good bruit  Imaging: No results found.  Labs: BMET Recent Labs  Lab 01/03/20 0305 01/03/20 2306 01/04/20 0727 01/05/20 0758 01/06/20 0054 01/08/20 0534 01/09/20 0123  NA 144 146* 144 140 140 136 134*  K 3.1* 3.0* 4.1 3.6 4.1 3.6 4.4  CL 106 115* 111 104 105 101 101  CO2 22 13* 16* 25 21* 20* 27  GLUCOSE 204* 194* 240* 151* 130* 230* 311*  BUN 89* 92* 102* 41* 49* 39* 21  CREATININE 5.35* 5.45* 6.11* 3.50* 3.91* 3.59* 2.29*  CALCIUM 7.3* 7.2* 7.7* 7.9* 8.1* 8.3* 7.9*  PHOS 8.5* 8.0*  --   --  5.9* 3.7  --    CBC Recent Labs  Lab 01/05/20 0758 01/05/20 0758 01/06/20 0054 01/07/20 0023 01/08/20 0534 01/09/20 0123  WBC 8.8  --  9.4 8.2 10.0  --   HGB 7.4*   < > 8.2* 7.7* 8.1* 7.5*  HCT 22.6*   < > 26.1* 24.9* 26.1* 24.7*  MCV 89.7  --  90.9 89.2 91.9  --   PLT 140*  --  148* 124* 125*  --    < > = values in this interval not displayed.    Medications:    . amiodarone  400 mg  Oral BID   Followed by  . [START ON 01/13/2020] amiodarone  200 mg Oral Daily  . amLODipine  5 mg Oral Daily  . aspirin EC  81 mg Oral Daily  . atorvastatin  10 mg Oral Q M,W,F  . calcium acetate  2,001 mg Oral TID WC  . Chlorhexidine Gluconate Cloth  6 each Topical Q0600  . cholecalciferol  4,000 Units Oral Daily  . darbepoetin (ARANESP) injection - DIALYSIS  60 mcg Intravenous Q Thu-HD  . insulin aspart  0-9 Units Subcutaneous TID WC  . lipase/protease/amylase  12,000 Units Oral TID AC  . magnesium chloride  2 tablet Oral BID  . mycophenolate  180 mg Oral BID  . pantoprazole  40 mg Oral Daily  . predniSONE  5 mg Oral Q breakfast  . sertraline  50 mg Oral Daily  . tacrolimus  2 mg Oral BID   . tamsulosin  0.4 mg Oral QPC supper      Reesa Chew  01/09/2020, 9:37 AM

## 2020-01-09 NOTE — Progress Notes (Signed)
[facility] for Heparin Indication: atrial fibrillation  Allergies  Allergen Reactions  . Metformin And Related Other (See Comments)    Has kidney problems    Patient Measurements: Height: 6' (182.9 cm) Weight: 65.9 kg (145 lb 4.5 oz) IBW/kg (Calculated) : 77.6  Vital Signs: Temp: 98.2 F (36.8 C) (08/22 1120) Temp Source: Oral (08/22 1120) BP: 128/66 (08/22 1120) Pulse Rate: 57 (08/22 0338)  Labs: Recent Labs    01/07/20 0023 01/07/20 0023 01/07/20 2324 01/08/20 0534 01/09/20 0123  HGB 7.7*   < >  --  8.1* 7.5*  HCT 24.9*  --   --  26.1* 24.7*  PLT 124*  --   --  125*  --   HEPARINUNFRC  --   --  0.50 0.38 0.46  CREATININE  --   --   --  3.59* 2.29*   < > = values in this interval not displayed.    Estimated Creatinine Clearance: 29.6 mL/min (A) (by C-G formula based on SCr of 2.29 mg/dL (H)).  Assessment: 64 yoM on apixaban PTA for hx AFib transitioned to IV heparin. Heparin was stopped 8/20 due to multiple nose bleeds (improved). Plans are to restart heparin. Possible surgery (revision transtibial amputation) this week.  Heparin level is therapeutic at 0.46 on 1350 units/hr.  Hg= 7.5 (low/stable)  Goal of Therapy:  Heparin level 0.3-0.7 units/ml Monitor platelets by anticoagulation protocol: Yes   Plan:  -Continue heparin drip at 1350 units/hr -Daily heparin level, CBC  [name], PharmD Clinical Pharmacist **Pharmacist phone directory can now be found on [email or web address] ([email or web address]).  Listed under [place].

## 2020-01-09 NOTE — Plan of Care (Signed)

## 2020-01-09 NOTE — Progress Notes (Signed)
PROGRESS NOTE    Paul [name]  [name] DOB: [birth year] DOA: 12/30/2019 PCP: [name], MD   Brief Narrative:  HPI on 12/31/2019 by Dr. Wynetta Fines Paul [name] is a 64 y.o. male with medical history significant of kidney transplant recipient (2016) with CKD stage IV, hypertension, IDDM, chronic A. fib on Xarelto, B/L BKA secondary to PVD, presented with new onset of right BKA stump pain and rash.  Symptoms started about 1 week ago, patient attributed that to a unfit prosthetic which might caused a skin abrasions.  He started to notice significant pain rash associated the end of right leg stump about 3 days ago.  Pain has been constant and gradually getting worse, denies any fever chills.  He took some Tylenol for the pain without any relief.  Patient has been following with [facility] for the kidney transplant, he was seen in the office about 2 weeks ago and multiple blood work and urine analysis sent, urine protein/Cre>5,000, patient however was not informed about results yet.  And patient claims his kidney function has been stable, denies any back pain, decreased urine output or urine color.  Interim history  Patient was admitted with sepsis secondary to right leg osteomyelitis with an abscess on BKA stump.  She was placed on IV antibiotics with vancomycin and cefepime.  Patient will need incision and drainage.  Also noted to have atrial fibrillation with RVR, cardiology consulted.  Nephrology consulted patient is having AKI on CKD, stage IV. Pending revision of amputation next week.   Assessment & Plan   Severe sepsis secondary to right leg osteomyelitis and abscess on BKA stump site -Sepsis was present on admission -CT scan of lower extremity showed evidence of early osteomyelitis and possible abscess of source of infection -Continue vancomycin and cefepime -Orthopedic surgery consulted and appreciated, recommended MRI of the affected extremity-which showed no  findings of osteomyelitis in the distal femur, patella or 60 m of tibia.  Unfortunately patient had cooperation issues therefore the distal 2 cm of the tibia/fibula and overlying stump were not included in the imaging, especially hyperdense collection noted on CT scan on 12/31/2019.  Small knee effusion, skeptical of septic joint given lack of synovitis. -CRP mildly elevated -Patient may need incision and drainage -Orthopedics, Dr. Sharol Given, recommending revision of R BKA when patient is medically stable - likely next week  Acute metabolic encephalopathy -likely secondary to the above -Resolved, patient currently alert and oriented x3  Atrial fibrillation with RVR -Patient with chronic atrial fibrillation -Noted to have tachycardia with hypotension -Currently in sinus rhythm  -Cardiology consulted and appreciated -was placed on amiodarone drip- cardiology is transitioning to oral for 1 months course, starting with 400mg  BID for 7 days, followed by 200mg  for 21 days (starting 8/19) -Eliquis has been held, currently on heparin drip  Acute kidney injury on chronic kidney disease, stage IV with severe metabolic acidosis  -history of renal transplant -Nephrology consulted and appreciated, patient tolerated HD -Patient received bicarb for metabolic acidosis -Continue tacrolimus, prednisone, mycophenolate- will speak to pharmacy about changing these medications to IV -last HD 01/08/2020  -Creatinine 2.29 -Continue to monitor BMP  Hypokalemia -Currently 4.4, will continue to monitor and replace as needed  Diabetes mellitus, type II, uncontrolled with hyperglycemia -Hemoglobin A1c 10.3 -patient is on low-dose prednisone -Continue insulin sliding scale and CBG monitoring  Mild hypomagnesemia  -Resolved with replacement, continue to monitor and replace as needed  History of pancreatic insufficiency -Patient had duodenal adenocarcinoma status  post resection -Continue Creon which was added on  01/01/2020  Essential hypertension -Patient was mildly hypotensive -Clonidine held -Continue hydralazine with holding parameters  Hyperlipidemia -continue statin  Anemia of chronic disease -Hemoglobin 7.5 today, continue to monitor CBC and transfuse if needed  DVT Prophylaxis Heparin drip  Code Status: Full  Family Communication: None at bedside  Disposition Plan:  Status is: Inpatient  Remains inpatient appropriate because:Hemodynamically unstable, Persistent severe electrolyte disturbances, Ongoing diagnostic testing needed not appropriate for outpatient work up and Inpatient level of care appropriate due to severity of illness   Dispo:  Patient From: Home  Planned Disposition: Home with Health Care Svc  Expected discharge date: TBD  Medically stable for discharge: No   Consultants Orthopedic surgery Cardiology Nephrology  Procedures  None  Antibiotics   Anti-infectives (From admission, onward)   Start     Dose/Rate Route Frequency Ordered Stop   01/11/20 1200  vancomycin (VANCOCIN) IVPB 1000 mg/200 mL premix        1,000 mg 200 mL/hr over 60 Minutes Intravenous Every T-Th-Sa (Hemodialysis) 01/08/20 1422     01/08/20 1041  Vancomycin (VANCOCIN) 750-5 MG/150ML-% IVPB       Note to Pharmacy: Paul [name]   : cabinet override      01/08/20 1041 01/08/20 1118   01/06/20 1200  vancomycin (VANCOCIN) IVPB 750 mg/150 ml premix  Status:  Discontinued        750 mg 150 mL/hr over 60 Minutes Intravenous Every T-Th-Sa (Hemodialysis) 01/05/20 1431 01/08/20 1422   01/04/20 1130  vancomycin (VANCOCIN) 1-5 GM/200ML-% IVPB       Note to Pharmacy: Murriel Paul [name]   : cabinet override      01/04/20 1130 01/04/20 1333   12/31/19 1400  vancomycin (VANCOCIN) IVPB 1000 mg/200 mL premix  Status:  Discontinued        1,000 mg 200 mL/hr over 60 Minutes Intravenous Every 48 hours 12/31/19 1347 01/05/20 1431   12/31/19 1345  ceFEPIme (MAXIPIME) 2 g in sodium chloride 0.9 % 100 mL  IVPB        2 g 200 mL/hr over 30 Minutes Intravenous Every 24 hours 12/31/19 1347     12/31/19 1330  vancomycin (VANCOCIN) IVPB 1000 mg/200 mL premix  Status:  Discontinued        1,000 mg 200 mL/hr over 60 Minutes Intravenous  Once 12/31/19 1315 12/31/19 1347      Subjective:   [name] seen and examined today.  Patient states he is feeling better today as compared to previous days.  He states he was finally lucid and remembered dialyzing yesterday.  He currently denies chest pain or shortness of breath, abdominal pain, nausea or vomiting, diarrhea or constipation.  Wonders if he will need another MRI and when his surgery will be. Objective:   Vitals:   01/08/20 1933 01/09/20 0001 01/09/20 0338 01/09/20 0805  BP: 124/65 121/63 124/64   Pulse: 64 (!) 56 (!) 57   Resp: 15 15 13    Temp: 98.1 F (36.7 C) 98.2 F (36.8 C) 98.2 F (36.8 C) 97.8 F (36.6 C)  TempSrc: Oral Oral Oral Oral  SpO2: 99% 98% 98%   Weight:      Height:        Intake/Output Summary (Last 24 hours) at 01/09/2020 0916 Last data filed at 01/09/2020 0600 Gross per 24 hour  Intake 839.61 ml  Output 1350 ml  Net -510.39 ml   Filed Weights   01/06/20 1815 01/08/20 0840  01/08/20 1215  Weight: 65.1 kg 66.7 kg 65.9 kg   Exam  General: Well developed, chronically ill-appearing, NAD  HEENT: NCAT,  mucous membranes moist.   Cardiovascular: S1 S2 auscultated, RRR, SEM  Respiratory: Clear to auscultation  Abdomen: Soft, nontender, nondistended, + bowel sounds  Extremities: Bilateral BKA.  Right stump with dressing in place, TTP  Neuro: AAOx3, nonfocal  Psych: Appropriate mood and affect, pleasant  Data Reviewed: I have personally reviewed following labs and imaging studies  CBC: Recent Labs  Lab 01/04/20 0727 01/04/20 0727 01/05/20 0758 01/06/20 0054 01/07/20 0023 01/08/20 0534 01/09/20 0123  WBC 8.6  --  8.8 9.4 8.2 10.0  --   HGB 7.6*   < > 7.4* 8.2* 7.7* 8.1* 7.5*  HCT 24.1*   < >  22.6* 26.1* 24.9* 26.1* 24.7*  MCV 90.9  --  89.7 90.9 89.2 91.9  --   PLT 169  --  140* 148* 124* 125*  --    < > = values in this interval not displayed.   Basic Metabolic Panel: Recent Labs  Lab 01/03/20 0305 01/03/20 0305 01/03/20 2306 01/03/20 2306 01/04/20 0727 01/05/20 0758 01/06/20 0054 01/08/20 0534 01/09/20 0123  NA 144   < > 146*   < > 144 140 140 136 134*  K 3.1*   < > 3.0*   < > 4.1 3.6 4.1 3.6 4.4  CL 106   < > 115*   < > 111 104 105 101 101  CO2 22   < > 13*   < > 16* 25 21* 20* 27  GLUCOSE 204*   < > 194*   < > 240* 151* 130* 230* 311*  BUN 89*   < > 92*   < > 102* 41* 49* 39* 21  CREATININE 5.35*   < > 5.45*   < > 6.11* 3.50* 3.91* 3.59* 2.29*  CALCIUM 7.3*   < > 7.2*   < > 7.7* 7.9* 8.1* 8.3* 7.9*  MG 1.6*  --  1.7  --  2.3 2.0  --   --  2.0  PHOS 8.5*  --  8.0*  --   --   --  5.9* 3.7  --    < > = values in this interval not displayed.   GFR: Estimated Creatinine Clearance: 29.6 mL/min (A) (by C-G formula based on SCr of 2.29 mg/dL (H)). Liver Function Tests: Recent Labs  Lab 01/03/20 0305 01/03/20 0305 01/03/20 2306 01/04/20 0727 01/05/20 0758 01/06/20 0054 01/08/20 0534  AST 11*  --   --  9* 13*  --   --   ALT 12  --   --  13 13  --   --   ALKPHOS 59  --   --  56 58  --   --   BILITOT 0.9  --   --  0.7 0.9  --   --   PROT 4.7*  --   --  4.6* 4.5*  --   --   ALBUMIN 2.0*   < > 1.8* 1.8* 1.7* 1.8* 1.7*   < > = values in this interval not displayed.   No results for input(s): LIPASE, AMYLASE in the last 168 hours. No results for input(s): AMMONIA in the last 168 hours. Coagulation Profile: Recent Labs  Lab 01/04/20 0751  INR 1.2   Cardiac Enzymes: No results for input(s): CKTOTAL, CKMB, CKMBINDEX, TROPONINI in the last 168 hours. BNP (last 3 results) No results for input(s): PROBNP  in the last 8760 hours. HbA1C: No results for input(s): HGBA1C in the last 72 hours. CBG: Recent Labs  Lab 01/08/20 0609 01/08/20 1306 01/08/20 1631  01/08/20 2118 01/09/20 0613  GLUCAP 226* 215* 225* 276* 270*   Lipid Profile: No results for input(s): CHOL, HDL, LDLCALC, TRIG, CHOLHDL, LDLDIRECT in the last 72 hours. Thyroid Function Tests: No results for input(s): TSH, T4TOTAL, FREET4, T3FREE, THYROIDAB in the last 72 hours. Anemia Panel: No results for input(s): VITAMINB12, FOLATE, FERRITIN, TIBC, IRON, RETICCTPCT in the last 72 hours. Urine analysis:    Component Value Date/Time   COLORURINE YELLOW 12/31/2019 1420   APPEARANCEUR HAZY (A) 12/31/2019 1420   LABSPEC 1.010 12/31/2019 1420   PHURINE 5.0 12/31/2019 1420   GLUCOSEU 150 (A) 12/31/2019 1420   HGBUR MODERATE (A) 12/31/2019 1420   BILIRUBINUR NEGATIVE 12/31/2019 1420   KETONESUR NEGATIVE 12/31/2019 1420   PROTEINUR >=300 (A) 12/31/2019 1420   UROBILINOGEN 0.2 08/02/2014 1424   NITRITE NEGATIVE 12/31/2019 1420   LEUKOCYTESUR MODERATE (A) 12/31/2019 1420   Sepsis Labs: @LABRCNTIP (procalcitonin:4,lacticidven:4)  ) Recent Results (from the past 240 hour(s))  SARS Coronavirus 2 by RT PCR (hospital order, performed in [place] hospital lab) Nasopharyngeal Nasopharyngeal Swab     Status: None   Collection Time: 12/31/19 11:15 AM   Specimen: Nasopharyngeal Swab  Result Value Ref Range Status   SARS Coronavirus 2 NEGATIVE NEGATIVE Final    Comment: (NOTE) SARS-CoV-2 target nucleic acids are NOT DETECTED.  The SARS-CoV-2 RNA is generally detectable in upper and lower respiratory specimens during the acute phase of infection. The lowest concentration of SARS-CoV-2 viral copies this assay can detect is 250 copies / mL. A negative result does not preclude SARS-CoV-2 infection and should not be used as the sole basis for treatment or other patient management decisions.  A negative result may occur with improper specimen collection / handling, submission of specimen other than nasopharyngeal swab, presence of viral mutation(s) within the areas targeted by this assay,  and inadequate number of viral copies (<250 copies / mL). A negative result must be combined with clinical observations, patient history, and epidemiological information.  Fact Sheet for Patients:   StrictlyIdeas.no  Fact Sheet for Healthcare Providers: BankingDealers.co.za  This test is not yet approved or  cleared by the [place] FDA and has been authorized for detection and/or diagnosis of SARS-CoV-2 by FDA under an Emergency Use Authorization (EUA).  This EUA will remain in effect (meaning this test can be used) for the duration of the COVID-19 declaration under Section 564(b)(1) of the Act, 21 U.S.C. section 360bbb-3(b)(1), unless the authorization is terminated or revoked sooner.  Performed at [facility], [place] [address], [place], [place] [address]       Radiology Studies: No results found.   Scheduled Meds: . amiodarone  400 mg Oral BID   Followed by  . [START ON 01/13/2020] amiodarone  200 mg Oral Daily  . amLODipine  5 mg Oral Daily  . aspirin EC  81 mg Oral Daily  . atorvastatin  10 mg Oral Q M,W,F  . calcium acetate  2,001 mg Oral TID WC  . Chlorhexidine Gluconate Cloth  6 each Topical Q0600  . cholecalciferol  4,000 Units Oral Daily  . darbepoetin (ARANESP) injection - DIALYSIS  60 mcg Intravenous Q Thu-HD  . insulin aspart  0-9 Units Subcutaneous TID WC  . lipase/protease/amylase  12,000 Units Oral TID AC  . magnesium chloride  2 tablet Oral BID  .  mycophenolate  180 mg Oral BID  . pantoprazole  40 mg Oral Daily  . predniSONE  5 mg Oral Q breakfast  . sertraline  50 mg Oral Daily  . tacrolimus  2 mg Oral BID  . tamsulosin  0.4 mg Oral QPC supper   Continuous Infusions: . ceFEPime (MAXIPIME) IV 2 g (01/08/20 1317)  . heparin 1,350 Units/hr (01/09/20 0753)  . [START ON 01/11/2020] vancomycin       LOS: 9 days   Time Spent in minutes   30 minutes  Gracyn Santillanes D.O. on 01/09/2020 at 9:16  AM  Between 7am to 7pm - Please see pager noted on [email or web address]  After 7pm go to [email or web address]  And look for the night coverage person covering for me after hours  [facility] Office  [phone]

## 2020-01-10 ENCOUNTER — Other Ambulatory Visit: Payer: Self-pay | Admitting: Physician Assistant

## 2020-01-10 LAB — RENAL FUNCTION PANEL
Albumin: 1.6 g/dL — ABNORMAL LOW (ref 3.5–5.0)
Anion gap: 9 (ref 5–15)
BUN: 35 mg/dL — ABNORMAL HIGH (ref 8–23)
CO2: 22 mmol/L (ref 22–32)
Calcium: 8 mg/dL — ABNORMAL LOW (ref 8.9–10.3)
Chloride: 102 mmol/L (ref 98–111)
Creatinine, Ser: 3.37 mg/dL — ABNORMAL HIGH (ref 0.61–1.24)
GFR calc Af Amer: 21 mL/min — ABNORMAL LOW (ref >60)
GFR calc non Af Amer: 18 mL/min — ABNORMAL LOW (ref >60)
Glucose, Bld: 406 mg/dL — ABNORMAL HIGH (ref 70–99)
Phosphorus: 1.9 mg/dL — ABNORMAL LOW (ref 2.5–4.6)
Potassium: 5.1 mmol/L (ref 3.5–5.1)
Sodium: 133 mmol/L — ABNORMAL LOW (ref 135–145)

## 2020-01-10 LAB — GLUCOSE, CAPILLARY
Glucose-Capillary: 388 mg/dL — ABNORMAL HIGH (ref 70–99)
Glucose-Capillary: 258 mg/dL — ABNORMAL HIGH (ref 70–99)
Glucose-Capillary: 310 mg/dL — ABNORMAL HIGH (ref 70–99)
Glucose-Capillary: 463 mg/dL — ABNORMAL HIGH (ref 70–99)

## 2020-01-10 LAB — HEPARIN LEVEL (UNFRACTIONATED): Heparin Unfractionated: 0.58 IU/mL (ref 0.30–0.70)

## 2020-01-10 LAB — HEMOGLOBIN AND HEMATOCRIT, BLOOD
HCT: 24.1 % — ABNORMAL LOW (ref 39.0–52.0)
Hemoglobin: 7.2 g/dL — ABNORMAL LOW (ref 13.0–17.0)

## 2020-01-10 MED ORDER — INSULIN ASPART 100 UNIT/ML ~~LOC~~ SOLN
2.0000 [IU] | Freq: Three times a day (TID) | SUBCUTANEOUS | Status: DC
  Administered 2020-01-10 – 2020-01-18 (×22): 2 [IU] via SUBCUTANEOUS

## 2020-01-10 MED ORDER — INSULIN GLARGINE 100 UNIT/ML ~~LOC~~ SOLN
6.0000 [IU] | Freq: Every day | SUBCUTANEOUS | Status: DC
  Administered 2020-01-10 – 2020-01-16 (×7): 6 [IU] via SUBCUTANEOUS
  Filled 2020-01-10 (×7): qty 0.06

## 2020-01-10 NOTE — Progress Notes (Signed)
[facility] NEPHROLOGY PROGRESS NOTE  Assessment/ Plan: Pt is a 64 y.o. yo male with DDRT 2015 CKD4, AoCKD Afib DM HTN b/l BKA h/o duodenal adenocarcinoma s/p resection.Allograft biopsy which showed interstitial fibrosis andCIN.Currently on Myfortic, Tac and prednisone. Baseline Cr is2.8-3.4(as recently as 10/12/2019) but appears to be slowly worsening as an outpatient.  Admitted with right BKA site infection and sepsis, renal failure  # Acute kidney injury on CKD4, now dialysis dependent: BL cr 2.8-3.4 s/p DDRT with DGF complicated by ACR and antibody mediated rejection requiring IVIG as recently as 2018. Started HD in the hospital.  Last HD on 8/21, tolerating well.  Urine output noted 900 cc in last 24 hours.  I will follow up tomorrow morning's lab and urine output before ordering HD, he has been receiving dialysis as TTS schedule. Continue tacrolimus, mycophenolate, prednisone-tac tapered to 2/2 on 8/18 -> mycophenolate tapered to 180 mg twice daily on 8/22. He has outpatient dialysis set up for TTS at [facility].  Patient has functional dialysis access.  #Hypertension/volume: Continue amlodipine.  Managing volume with dialysis.  #Secondary hyperparathyroidism/hyperphosphatemia: Phosphorus level is low therefore I will discontinue PhosLo.  Check PTH level.  #Anemia of CKD, acute illness: Continue Aranesp.  Iron saturation 81 and currently on antibiotics.  #Severe sepsis due to right leg osteomyelitis/abscess on BKA stump site: Currently on vancomycin and cefepime.  Seen by Dr. Sharol Given recommending revision of right BKA sometime this week.  Subjective: Seen and examined at bedside.  Denies nausea vomiting chest pain shortness of breath.  Urine output recorded 900 cc.  No new event. Objective Vital signs in last 24 hours: Vitals:   01/09/20 1924 01/09/20 2336 01/10/20 0330 01/10/20 0800  BP: 125/63 131/66 134/66 (!) 125/59  Pulse: 60 60 60 (!) 58  Resp: 18 15 18    Temp:  97.9 F (36.6 C) 98.1 F (36.7 C) 97.8 F (36.6 C) 98.1 F (36.7 C)  TempSrc: Oral Oral Oral Oral  SpO2: 100% 97% 98%   Weight:      Height:       Weight change:   Intake/Output Summary (Last 24 hours) at 01/10/2020 0907 Last data filed at 01/10/2020 0600 Gross per 24 hour  Intake 442.11 ml  Output 900 ml  Net -457.89 ml       Labs: Basic Metabolic Panel: Recent Labs  Lab 01/06/20 0054 01/06/20 0054 01/08/20 0534 01/09/20 0123 01/10/20 0059  NA 140   < > 136 134* 133*  K 4.1   < > 3.6 4.4 5.1  CL 105   < > 101 101 102  CO2 21*   < > 20* 27 22  GLUCOSE 130*   < > 230* 311* 406*  BUN 49*   < > 39* 21 35*  CREATININE 3.91*   < > 3.59* 2.29* 3.37*  CALCIUM 8.1*   < > 8.3* 7.9* 8.0*  PHOS 5.9*  --  3.7  --  1.9*   < > = values in this interval not displayed.   Liver Function Tests: Recent Labs  Lab 01/04/20 0727 01/04/20 0727 01/05/20 0758 01/05/20 0758 01/06/20 0054 01/08/20 0534 01/10/20 0059  AST 9*  --  13*  --   --   --   --   ALT 13  --  13  --   --   --   --   ALKPHOS 56  --  58  --   --   --   --   BILITOT 0.7  --  0.9  --   --   --   --   PROT 4.6*  --  4.5*  --   --   --   --   ALBUMIN 1.8*   < > 1.7*   < > 1.8* 1.7* 1.6*   < > = values in this interval not displayed.   No results for input(s): LIPASE, AMYLASE in the last 168 hours. No results for input(s): AMMONIA in the last 168 hours. CBC: Recent Labs  Lab 01/04/20 0727 01/04/20 0727 01/05/20 0758 01/05/20 0758 01/06/20 0054 01/06/20 0054 01/07/20 0023 01/07/20 0023 01/08/20 0534 01/09/20 0123 01/10/20 0059  WBC 8.6   < > 8.8   < > 9.4  --  8.2  --  10.0  --   --   HGB 7.6*   < > 7.4*   < > 8.2*   < > 7.7*   < > 8.1* 7.5* 7.2*  HCT 24.1*   < > 22.6*   < > 26.1*   < > 24.9*   < > 26.1* 24.7* 24.1*  MCV 90.9  --  89.7  --  90.9  --  89.2  --  91.9  --   --   PLT 169   < > 140*   < > 148*  --  124*  --  125*  --   --    < > = values in this interval not displayed.   Cardiac  Enzymes: No results for input(s): CKTOTAL, CKMB, CKMBINDEX, TROPONINI in the last 168 hours. CBG: Recent Labs  Lab 01/09/20 0613 01/09/20 1132 01/09/20 1649 01/09/20 2130 01/10/20 0557  GLUCAP 270* 263* 343* 394* 388*    Iron Studies: No results for input(s): IRON, TIBC, TRANSFERRIN, FERRITIN in the last 72 hours. Studies/Results: No results found.  Medications: Infusions: . ceFEPime (MAXIPIME) IV 2 g (01/09/20 1341)  . heparin 1,350 Units/hr (01/10/20 0328)  . [START ON 01/11/2020] vancomycin      Scheduled Medications: . amiodarone  400 mg Oral BID   Followed by  . [START ON 01/13/2020] amiodarone  200 mg Oral Daily  . amLODipine  5 mg Oral Daily  . aspirin EC  81 mg Oral Daily  . atorvastatin  10 mg Oral Q M,W,F  . calcium acetate  2,001 mg Oral TID WC  . Chlorhexidine Gluconate Cloth  6 each Topical Q0600  . cholecalciferol  4,000 Units Oral Daily  . darbepoetin (ARANESP) injection - DIALYSIS  60 mcg Intravenous Q Thu-HD  . insulin aspart  0-9 Units Subcutaneous TID WC  . lipase/protease/amylase  12,000 Units Oral TID AC  . magnesium chloride  2 tablet Oral BID  . mycophenolate  180 mg Oral BID  . pantoprazole  40 mg Oral Daily  . predniSONE  5 mg Oral Q breakfast  . sertraline  50 mg Oral Daily  . tacrolimus  2 mg Oral BID  . tamsulosin  0.4 mg Oral QPC supper    have reviewed scheduled and prn medications.  Physical Exam: General:NAD, comfortable Heart:RRR, s1s2 nl Lungs:clear b/l, no crackle Abdomen:soft, Non-tender, non-distended Extremities: Bilateral BKA, right BKA site has bandage applied. Dialysis Access: Right AV fistula has good thrill and bruit.  Paul [name] 01/10/2020,9:07 AM  LOS: 10 days  Pager: [phone]

## 2020-01-10 NOTE — Progress Notes (Addendum)
PROGRESS NOTE    Paul [name]  VFI:[id number] DOB: 11/03/1955 DOA: 12/30/2019 PCP: [name], MD   Brief Narrative:  HPI on 12/31/2019 by Dr. Wynetta Fines Paul [name] is a 64 y.o. male with medical history significant of kidney transplant recipient (2016) with CKD stage IV, hypertension, IDDM, chronic A. fib on Xarelto, B/L BKA secondary to PVD, presented with new onset of right BKA stump pain and rash.  Symptoms started about 1 week ago, patient attributed that to a unfit prosthetic which might caused a skin abrasions.  He started to notice significant pain rash associated the end of right leg stump about 3 days ago.  Pain has been constant and gradually getting worse, denies any fever chills.  He took some Tylenol for the pain without any relief.  Patient has been following with [facility] for the kidney transplant, he was seen in the office about 2 weeks ago and multiple blood work and urine analysis sent, urine protein/Cre>5,000, patient however was not informed about results yet.  And patient claims his kidney function has been stable, denies any back pain, decreased urine output or urine color.  Interim history  Patient was admitted with sepsis secondary to right leg osteomyelitis with an abscess on BKA stump.  She was placed on IV antibiotics with vancomycin and cefepime.  Patient will need incision and drainage.  Also noted to have atrial fibrillation with RVR, cardiology consulted.  Nephrology consulted patient is having AKI on CKD, stage IV. Pending revision of amputation next week.   Assessment & Plan   Severe sepsis secondary to right leg osteomyelitis and abscess on BKA stump site -Sepsis was present on admission -CT scan of lower extremity showed evidence of early osteomyelitis and possible abscess of source of infection -Continue vancomycin and cefepime -Orthopedic surgery consulted and appreciated, recommended MRI of the affected extremity-which showed no  findings of osteomyelitis in the distal femur, patella or 60 m of tibia.  Unfortunately patient had cooperation issues therefore the distal 2 cm of the tibia/fibula and overlying stump were not included in the imaging, especially hyperdense collection noted on CT scan on 12/31/2019.  Small knee effusion, skeptical of septic joint given lack of synovitis. -CRP mildly elevated -Patient may need incision and drainage -Orthopedics, Dr. Sharol Given, recommending revision of R BKA when patient is medically stable - which he is currently stable  -Discussed with Dr. Sharol Given this morning, patient will be placed on the schedule for 09/02/6061  Acute metabolic encephalopathy -likely secondary to the above -Resolved, patient currently alert and oriented x3  Atrial fibrillation with RVR -Patient with chronic atrial fibrillation -Noted to have tachycardia with hypotension -Currently in sinus rhythm  -Cardiology consulted and appreciated -was placed on amiodarone drip- cardiology is transitioning to oral for 1 months course, starting with 400mg  BID for 7 days, followed by 200mg  for 21 days (starting 8/19) -Eliquis has been held, currently on heparin drip  Acute kidney injury on chronic kidney disease, stage IV with severe metabolic acidosis  -history of renal transplant -Nephrology consulted and appreciated, patient tolerated HD -Patient received bicarb for metabolic acidosis -Continue tacrolimus, prednisone, mycophenolate- will speak to pharmacy about changing these medications to IV -last HD 01/08/2020  -Creatinine 3.37 -Patient does have a dialysis access as well as an outpatient slot.  However wonder if this will be a long-term dialysis.  Pending further recommendations from nephrology -Continue to monitor BMP  Hypokalemia -Currently 5.1, will continue to monitor and replace as needed  Diabetes mellitus, type II, uncontrolled with hyperglycemia -Hemoglobin A1c 10.3 -patient is on low-dose  prednisone -Continue insulin sliding scale and CBG monitoring  Mild hypomagnesemia  -Resolved with replacement, continue to monitor and replace as needed  History of pancreatic insufficiency -Patient had duodenal adenocarcinoma status post resection -Continue Creon which was added on 01/01/2020  Essential hypertension -Patient was mildly hypotensive -Clonidine held -Continue hydralazine with holding parameters  Hyperlipidemia -continue statin  Anemia of chronic disease -Hemoglobin 7.2 today.discussed with nephrology, Dr. Carolin Sicks regarding transfusion.  Will wait an additional day and continue to monitor CBC   DVT Prophylaxis Heparin drip  Code Status: Full  Family Communication: None at bedside. Wife via phone on 01/09/20  Disposition Plan:  Status is: Inpatient  Remains inpatient appropriate because:Hemodynamically unstable, Persistent severe electrolyte disturbances, Ongoing diagnostic testing needed not appropriate for outpatient work up and Inpatient level of care appropriate due to severity of illness   Dispo:  Patient From: Home  Planned Disposition: Home with Health Care Svc  Expected discharge date: TBD  Medically stable for discharge: No   Consultants Orthopedic surgery Cardiology Nephrology  Procedures  None  Antibiotics   Anti-infectives (From admission, onward)   Start     Dose/Rate Route Frequency Ordered Stop   01/11/20 1200  vancomycin (VANCOCIN) IVPB 1000 mg/200 mL premix        1,000 mg 200 mL/hr over 60 Minutes Intravenous Every T-Th-Sa (Hemodialysis) 01/08/20 1422     01/08/20 1041  Vancomycin (VANCOCIN) 750-5 MG/150ML-% IVPB       Note to Pharmacy: Herriott, Melisa   : cabinet override      01/08/20 1041 01/08/20 1118   01/06/20 1200  vancomycin (VANCOCIN) IVPB 750 mg/150 ml premix  Status:  Discontinued        750 mg 150 mL/hr over 60 Minutes Intravenous Every T-Th-Sa (Hemodialysis) 01/05/20 1431 01/08/20 1422   01/04/20 1130  vancomycin  (VANCOCIN) 1-5 GM/200ML-% IVPB       Note to Pharmacy: Murriel Hopper   : cabinet override      01/04/20 1130 01/04/20 1333   12/31/19 1400  vancomycin (VANCOCIN) IVPB 1000 mg/200 mL premix  Status:  Discontinued        1,000 mg 200 mL/hr over 60 Minutes Intravenous Every 48 hours 12/31/19 1347 01/05/20 1431   12/31/19 1345  ceFEPIme (MAXIPIME) 2 g in sodium chloride 0.9 % 100 mL IVPB        2 g 200 mL/hr over 30 Minutes Intravenous Every 24 hours 12/31/19 1347     12/31/19 1330  vancomycin (VANCOCIN) IVPB 1000 mg/200 mL premix  Status:  Discontinued        1,000 mg 200 mL/hr over 60 Minutes Intravenous  Once 12/31/19 1315 12/31/19 1347      Subjective:   Paul [name] seen and examined today.  Patient complains of increased tenderness and soreness in his right stump.  Wonders when his surgery will be.  Denies current chest pain or shortness of breath, abdominal pain, nausea or vomiting, diarrhea constipation, dizziness or headache. Objective:   Vitals:   01/09/20 1924 01/09/20 2336 01/10/20 0330 01/10/20 0800  BP: 125/63 131/66 134/66 (!) 125/59  Pulse: 60 60 60 (!) 58  Resp: 18 15 18    Temp: 97.9 F (36.6 C) 98.1 F (36.7 C) 97.8 F (36.6 C) 98.1 F (36.7 C)  TempSrc: Oral Oral Oral Oral  SpO2: 100% 97% 98%   Weight:      Height:  Intake/Output Summary (Last 24 hours) at 01/10/2020 0919 Last data filed at 01/10/2020 0600 Gross per 24 hour  Intake 442.11 ml  Output 900 ml  Net -457.89 ml   Filed Weights   01/06/20 1815 01/08/20 0840 01/08/20 1215  Weight: 65.1 kg 66.7 kg 65.9 kg   Exam  General: Well developed, well nourished, NAD, appears stated age  87: NCAT, mucous membranes moist.   Cardiovascular: S1 S2 auscultated, SEM, RRR  Respiratory: Clear to auscultation bilaterally  Abdomen: Soft, nontender, nondistended, + bowel sounds  Extremities: Bilateral BKA.  Right stump with dressing in place, TTP  Neuro: AAOx3, nonfocal  Psych: does not,  appropriate mood and affect   Data Reviewed: I have personally reviewed following labs and imaging studies  CBC: Recent Labs  Lab 01/04/20 0727 01/04/20 0727 01/05/20 0758 01/05/20 0758 01/06/20 0054 01/07/20 0023 01/08/20 0534 01/09/20 0123 01/10/20 0059  WBC 8.6  --  8.8  --  9.4 8.2 10.0  --   --   HGB 7.6*   < > 7.4*   < > 8.2* 7.7* 8.1* 7.5* 7.2*  HCT 24.1*   < > 22.6*   < > 26.1* 24.9* 26.1* 24.7* 24.1*  MCV 90.9  --  89.7  --  90.9 89.2 91.9  --   --   PLT 169  --  140*  --  148* 124* 125*  --   --    < > = values in this interval not displayed.   Basic Metabolic Panel: Recent Labs  Lab 01/03/20 2306 01/03/20 2306 01/04/20 0727 01/04/20 0727 01/05/20 0758 01/06/20 0054 01/08/20 0534 01/09/20 0123 01/10/20 0059  NA 146*   < > 144   < > 140 140 136 134* 133*  K 3.0*   < > 4.1   < > 3.6 4.1 3.6 4.4 5.1  CL 115*   < > 111   < > 104 105 101 101 102  CO2 13*   < > 16*   < > 25 21* 20* 27 22  GLUCOSE 194*   < > 240*   < > 151* 130* 230* 311* 406*  BUN 92*   < > 102*   < > 41* 49* 39* 21 35*  CREATININE 5.45*   < > 6.11*   < > 3.50* 3.91* 3.59* 2.29* 3.37*  CALCIUM 7.2*   < > 7.7*   < > 7.9* 8.1* 8.3* 7.9* 8.0*  MG 1.7  --  2.3  --  2.0  --   --  2.0  --   PHOS 8.0*  --   --   --   --  5.9* 3.7  --  1.9*   < > = values in this interval not displayed.   GFR: Estimated Creatinine Clearance: 20.1 mL/min (A) (by C-G formula based on SCr of 3.37 mg/dL (H)). Liver Function Tests: Recent Labs  Lab 01/04/20 0727 01/05/20 0758 01/06/20 0054 01/08/20 0534 01/10/20 0059  AST 9* 13*  --   --   --   ALT 13 13  --   --   --   ALKPHOS 56 58  --   --   --   BILITOT 0.7 0.9  --   --   --   PROT 4.6* 4.5*  --   --   --   ALBUMIN 1.8* 1.7* 1.8* 1.7* 1.6*   No results for input(s): LIPASE, AMYLASE in the last 168 hours. No results for input(s): AMMONIA in the last  168 hours. Coagulation Profile: Recent Labs  Lab 01/04/20 0751  INR 1.2   Cardiac Enzymes: No results  for input(s): CKTOTAL, CKMB, CKMBINDEX, TROPONINI in the last 168 hours. BNP (last 3 results) No results for input(s): PROBNP in the last 8760 hours. HbA1C: No results for input(s): HGBA1C in the last 72 hours. CBG: Recent Labs  Lab 01/09/20 0613 01/09/20 1132 01/09/20 1649 01/09/20 2130 01/10/20 0557  GLUCAP 270* 263* 343* 394* 388*   Lipid Profile: No results for input(s): CHOL, HDL, LDLCALC, TRIG, CHOLHDL, LDLDIRECT in the last 72 hours. Thyroid Function Tests: No results for input(s): TSH, T4TOTAL, FREET4, T3FREE, THYROIDAB in the last 72 hours. Anemia Panel: No results for input(s): VITAMINB12, FOLATE, FERRITIN, TIBC, IRON, RETICCTPCT in the last 72 hours. Urine analysis:    Component Value Date/Time   COLORURINE YELLOW 12/31/2019 1420   APPEARANCEUR HAZY (A) 12/31/2019 1420   LABSPEC 1.010 12/31/2019 1420   PHURINE 5.0 12/31/2019 1420   GLUCOSEU 150 (A) 12/31/2019 1420   HGBUR MODERATE (A) 12/31/2019 1420   BILIRUBINUR NEGATIVE 12/31/2019 1420   KETONESUR NEGATIVE 12/31/2019 1420   PROTEINUR >=300 (A) 12/31/2019 1420   UROBILINOGEN 0.2 08/02/2014 1424   NITRITE NEGATIVE 12/31/2019 1420   LEUKOCYTESUR MODERATE (A) 12/31/2019 1420   Sepsis Labs: @LABRCNTIP (procalcitonin:4,lacticidven:4)  ) Recent Results (from the past 240 hour(s))  SARS Coronavirus 2 by RT PCR (hospital order, performed in [facility] hospital lab) Nasopharyngeal Nasopharyngeal Swab     Status: None   Collection Time: 12/31/19 11:15 AM   Specimen: Nasopharyngeal Swab  Result Value Ref Range Status   SARS Coronavirus 2 NEGATIVE NEGATIVE Final    Comment: (NOTE) SARS-CoV-2 target nucleic acids are NOT DETECTED.  The SARS-CoV-2 RNA is generally detectable in upper and lower respiratory specimens during the acute phase of infection. The lowest concentration of SARS-CoV-2 viral copies this assay can detect is 250 copies / mL. A negative result does not preclude SARS-CoV-2 infection and should not  be used as the sole basis for treatment or other patient management decisions.  A negative result may occur with improper specimen collection / handling, submission of specimen other than nasopharyngeal swab, presence of viral mutation(s) within the areas targeted by this assay, and inadequate number of viral copies (<250 copies / mL). A negative result must be combined with clinical observations, patient history, and epidemiological information.  Fact Sheet for Patients:   StrictlyIdeas.no  Fact Sheet for Healthcare Providers: BankingDealers.co.za  This test is not yet approved or  cleared by the [place] FDA and has been authorized for detection and/or diagnosis of SARS-CoV-2 by FDA under an Emergency Use Authorization (EUA).  This EUA will remain in effect (meaning this test can be used) for the duration of the COVID-19 declaration under Section 564(b)(1) of the Act, 21 U.S.C. section 360bbb-3(b)(1), unless the authorization is terminated or revoked sooner.  Performed at [facility], [place] [address] [place],  [address]       Radiology Studies: No results found.   Scheduled Meds: . amiodarone  400 mg Oral BID   Followed by  . [START ON 01/13/2020] amiodarone  200 mg Oral Daily  . amLODipine  5 mg Oral Daily  . aspirin EC  81 mg Oral Daily  . atorvastatin  10 mg Oral Q M,W,F  . Chlorhexidine Gluconate Cloth  6 each Topical Q0600  . cholecalciferol  4,000 Units Oral Daily  . darbepoetin (ARANESP) injection - DIALYSIS  60 mcg Intravenous Q Thu-HD  .  insulin aspart  0-9 Units Subcutaneous TID WC  . lipase/protease/amylase  12,000 Units Oral TID AC  . magnesium chloride  2 tablet Oral BID  . mycophenolate  180 mg Oral BID  . pantoprazole  40 mg Oral Daily  . predniSONE  5 mg Oral Q breakfast  . sertraline  50 mg Oral Daily  . tacrolimus  2 mg Oral BID  . tamsulosin  0.4 mg Oral QPC supper   Continuous  Infusions: . ceFEPime (MAXIPIME) IV 2 g (01/09/20 1341)  . heparin 1,350 Units/hr (01/10/20 0328)  . [START ON 01/11/2020] vancomycin       LOS: 10 days   Time Spent in minutes   30 minutes  Samier Jaco D.O. on 01/10/2020 at 9:19 AM  Between 7am to 7pm - Please see pager noted on [email or web address]  After 7pm go to [email or web address]  And look for the night coverage person covering for me after hours  [facility] Office  [phone]

## 2020-01-10 NOTE — Plan of Care (Signed)

## 2020-01-10 NOTE — Progress Notes (Signed)
[place] for Heparin Indication: atrial fibrillation  Allergies  Allergen Reactions  . Metformin And Related Other (See Comments)    Has kidney problems    Patient Measurements: Height: 6' (182.9 cm) Weight: 65.9 kg (145 lb 4.5 oz) IBW/kg (Calculated) : 77.6  Vital Signs: Temp: 98.3 F (36.8 C) (08/23 1200) Temp Source: Oral (08/23 1200) BP: 125/75 (08/23 1200) Pulse Rate: 61 (08/23 1200)  Labs: Recent Labs    01/08/20 0534 01/08/20 0534 01/09/20 0123 01/10/20 0059  HGB 8.1*   < > 7.5* 7.2*  HCT 26.1*  --  24.7* 24.1*  PLT 125*  --   --   --   HEPARINUNFRC 0.38  --  0.46 0.58  CREATININE 3.59*  --  2.29* 3.37*   < > = values in this interval not displayed.    Estimated Creatinine Clearance: 20.1 mL/min (A) (by C-G formula based on SCr of 3.37 mg/dL (H)).  Assessment: 13 yoM on apixaban PTA for hx AFib transitioned to IV heparin. Heparin was stopped 8/20 due to multiple nose bleeds (improved). Plans are to restart heparin. Possible surgery (revision transtibial amputation) this week.  Heparin level is therapeutic at 0.58, on 1350 units/hr. Hgb low at 7.2, plt 125 on last check 8/21. No s/sx of bleeding or infusion issues.   Goal of Therapy:  Heparin level 0.3-0.7 units/ml Monitor platelets by anticoagulation protocol: Yes   Plan:  -Continue heparin drip at 1350 units/hr -Daily heparin level, CBC  Antonietta Jewel, PharmD, [place] Pharmacist  Phone: [phone] 01/10/2020 1:57 PM  Please check AMION for all [place] phone numbers After 10:00 PM, call [place] [phone]

## 2020-01-10 NOTE — Progress Notes (Signed)
Inpatient Diabetes Program Recommendations  AACE/ADA: New Consensus Statement on Inpatient Glycemic Control (2015)  Target Ranges:  Prepandial:   less than 140 mg/dL      Peak postprandial:   less than 180 mg/dL (1-2 hours)      Critically ill patients:  140 - 180 mg/dL   Lab Results  Component Value Date   GLUCAP 388 (H) 01/10/2020   HGBA1C 10.3 (H) 12/31/2019    Review of Glycemic Control Results for OTHON, GUARDIA (MRN [id number]) as of 01/10/2020 10:30  Ref. Range 01/09/2020 06:13 01/09/2020 11:32 01/09/2020 16:49 01/09/2020 21:30 01/10/2020 05:57  Glucose-Capillary Latest Ref Range: 70 - 99 mg/dL 270 (H) 263 (H) 343 (H) 394 (H) 388 (H)   Diabetes history: DM 2 Outpatient Diabetes medications: Humalog 4-8 units daily, Basaglar 10 units daily Current orders for Inpatient glycemic control:  Novolog sensitive tid with meals  Inpatient Diabetes Program Recommendations:    Please restart Lantus.  Consider adding Lantus 6 units daily.  Also consider adding Novolog 2 units tid with meals (hold if patient eats less than 50%).   Thanks,  Adah Perl, RN, BC-ADM Inpatient Diabetes Coordinator Pager [phone] (8a-5p)

## 2020-01-11 LAB — RENAL FUNCTION PANEL
Albumin: 1.6 g/dL — ABNORMAL LOW (ref 3.5–5.0)
Anion gap: 10 (ref 5–15)
BUN: 54 mg/dL — ABNORMAL HIGH (ref 8–23)
CO2: 19 mmol/L — ABNORMAL LOW (ref 22–32)
Calcium: 8.3 mg/dL — ABNORMAL LOW (ref 8.9–10.3)
Chloride: 103 mmol/L (ref 98–111)
Creatinine, Ser: 4.17 mg/dL — ABNORMAL HIGH (ref 0.61–1.24)
GFR calc Af Amer: 16 mL/min — ABNORMAL LOW (ref >60)
GFR calc non Af Amer: 14 mL/min — ABNORMAL LOW (ref >60)
Glucose, Bld: 293 mg/dL — ABNORMAL HIGH (ref 70–99)
Phosphorus: 1.8 mg/dL — ABNORMAL LOW (ref 2.5–4.6)
Potassium: 4.6 mmol/L (ref 3.5–5.1)
Sodium: 132 mmol/L — ABNORMAL LOW (ref 135–145)

## 2020-01-11 LAB — CBC
HCT: 25.5 % — ABNORMAL LOW (ref 39.0–52.0)
Hemoglobin: 7.8 g/dL — ABNORMAL LOW (ref 13.0–17.0)
MCH: 28.7 pg (ref 26.0–34.0)
MCHC: 30.6 g/dL (ref 30.0–36.0)
MCV: 93.8 fL (ref 80.0–100.0)
Platelets: 106 10*3/uL — ABNORMAL LOW (ref 150–400)
RBC: 2.72 MIL/uL — ABNORMAL LOW (ref 4.22–5.81)
RDW: 14.1 % (ref 11.5–15.5)
WBC: 9.4 10*3/uL (ref 4.0–10.5)
nRBC: 0 % (ref 0.0–0.2)

## 2020-01-11 LAB — GLUCOSE, CAPILLARY
Glucose-Capillary: 308 mg/dL — ABNORMAL HIGH (ref 70–99)
Glucose-Capillary: 237 mg/dL — ABNORMAL HIGH (ref 70–99)
Glucose-Capillary: 166 mg/dL — ABNORMAL HIGH (ref 70–99)

## 2020-01-11 LAB — HEPARIN LEVEL (UNFRACTIONATED)
Heparin Unfractionated: 1.54 IU/mL — ABNORMAL HIGH (ref 0.30–0.70)
Heparin Unfractionated: 0.56 IU/mL (ref 0.30–0.70)

## 2020-01-11 MED ORDER — VANCOMYCIN HCL IN DEXTROSE 1-5 GM/200ML-% IV SOLN
INTRAVENOUS | Status: AC
  Administered 2020-01-11: 1000 mg via INTRAVENOUS
  Filled 2020-01-11: qty 200

## 2020-01-11 MED ORDER — CHLORHEXIDINE GLUCONATE CLOTH 2 % EX PADS
6.0000 | MEDICATED_PAD | Freq: Every day | CUTANEOUS | Status: DC
  Administered 2020-01-15: 6 via TOPICAL

## 2020-01-11 MED ORDER — MUPIROCIN 2 % EX OINT
1.0000 "application " | TOPICAL_OINTMENT | Freq: Two times a day (BID) | CUTANEOUS | Status: AC
  Administered 2020-01-11 – 2020-01-15 (×8): 1 via NASAL
  Filled 2020-01-11: qty 22

## 2020-01-11 NOTE — Progress Notes (Signed)
[place] for Heparin Indication: atrial fibrillation  Allergies  Allergen Reactions  . Metformin And Related Other (See Comments)    Has kidney problems    Patient Measurements: Height: 6' (182.9 cm) Weight: 65.9 kg (145 lb 4.5 oz) IBW/kg (Calculated) : 77.6  Vital Signs: Temp: 98.6 F (37 C) (08/24 0750) Temp Source: Oral (08/24 0750) BP: 112/59 (08/24 0750) Pulse Rate: 58 (08/24 0750)  Labs: Recent Labs    01/09/20 0123 01/09/20 0123 01/10/20 0059 01/11/20 0108 01/11/20 0518 01/11/20 0854  HGB 7.5*   < > 7.2* 7.8*  --   --   HCT 24.7*  --  24.1* 25.5*  --   --   PLT  --   --   --  106*  --   --   HEPARINUNFRC 0.46   < > 0.58 1.54* 0.56  --   CREATININE 2.29*  --  3.37*  --   --  4.17*   < > = values in this interval not displayed.    Estimated Creatinine Clearance: 16.2 mL/min (A) (by C-G formula based on SCr of 4.17 mg/dL (H)).  Assessment: 30 yoM on apixaban PTA for hx AFib transitioned to IV heparin. Heparin was stopped 8/20 due to multiple nose bleeds (improved). Plans are to restart heparin. Possible surgery (revision transtibial amputation) this week.  Heparin level is therapeutic at 0.56, on 1350 units/hr. Hgb improved at 7.8, plt 106. No s/sx of bleeding or infusion issues.   Goal of Therapy:  Heparin level 0.3-0.7 units/ml Monitor platelets by anticoagulation protocol: Yes   Plan:  -Continue heparin drip at 1350 units/hr -Daily heparin level, CBC  Erin Hearing PharmD., BCPS Clinical Pharmacist 01/11/2020 10:17 AM  Please check AMION for all [place] phone numbers After 10:00 PM, call [place] [phone]

## 2020-01-11 NOTE — Progress Notes (Signed)
[facility] NEPHROLOGY PROGRESS NOTE  Assessment/ Plan: Pt is a 64 y.o. yo male with DDRT 2015 CKD4, AoCKD Afib DM HTN b/l BKA h/o duodenal adenocarcinoma s/p resection.Allograft biopsy which showed interstitial fibrosis andCIN.Currently on Myfortic, Tac and prednisone. Baseline Cr is2.8-3.4(as recently as 10/12/2019) but appears to be slowly worsening as an outpatient.  Admitted with right BKA site infection and sepsis, renal failure  # Acute kidney injury on CKD4, now dialysis dependent: BL cr 2.8-3.4 s/p DDRT with DGF complicated by ACR and antibody mediated rejection requiring IVIG as recently as 2018. Started HD in the hospital.  Last HD on 8/21, tolerated well.  Urine output 850 cc in 24 hours.  Euvolemic on exam.  I will wait for today's renal panel before ordering dialysis.He has been receiving dialysis as TTS schedule. Continue tacrolimus, mycophenolate, prednisone-tac tapered to 2/2 on 8/18 -> mycophenolate tapered to 180 mg twice daily on 8/22. He has outpatient dialysis set up for TTS at [facility].  Patient has functional dialysis access.  #Hypertension/volume: Continue amlodipine.  Managing volume with dialysis.  #Secondary hyperparathyroidism/hyperphosphatemia: Phosphorus level is low therefore discontinued PhosLo.  Follow-up PTH level.  #Anemia of CKD, acute illness: Continue Aranesp.  Iron saturation 81 and currently on antibiotics.  Hemoglobin 7.8, no need for transfusion.  #Severe sepsis due to right leg osteomyelitis/abscess on BKA stump site: Currently on vancomycin and cefepime.  Seen by Dr. Sharol Given recommending revision of right BKA sometime this week, likely tomorrow.  Subjective: Seen and examined at bedside.  Denies nausea vomiting chest pain shortness of breath.  Urine output recorded 850 cc.  No new event.  Renal panel pending from this morning Objective Vital signs in last 24 hours: Vitals:   01/10/20 2350 01/11/20 0300 01/11/20 0403 01/11/20 0750   BP: (!) 147/66  (!) 143/68 (!) 112/59  Pulse: (!) 54 (!) 54 (!) 56 (!) 58  Resp: 13 16 13 16   Temp: 98.7 F (37.1 C)  97.9 F (36.6 C) 98.6 F (37 C)  TempSrc: Oral  Axillary Oral  SpO2:  98%  95%  Weight:      Height:       Weight change:   Intake/Output Summary (Last 24 hours) at 01/11/2020 0814 Last data filed at 01/11/2020 0800 Gross per 24 hour  Intake 783.01 ml  Output 850 ml  Net -66.99 ml       Labs: Basic Metabolic Panel: Recent Labs  Lab 01/06/20 0054 01/06/20 0054 01/08/20 0534 01/09/20 0123 01/10/20 0059  NA 140   < > 136 134* 133*  K 4.1   < > 3.6 4.4 5.1  CL 105   < > 101 101 102  CO2 21*   < > 20* 27 22  GLUCOSE 130*   < > 230* 311* 406*  BUN 49*   < > 39* 21 35*  CREATININE 3.91*   < > 3.59* 2.29* 3.37*  CALCIUM 8.1*   < > 8.3* 7.9* 8.0*  PHOS 5.9*  --  3.7  --  1.9*   < > = values in this interval not displayed.   Liver Function Tests: Recent Labs  Lab 01/05/20 0758 01/05/20 0758 01/06/20 0054 01/08/20 0534 01/10/20 0059  AST 13*  --   --   --   --   ALT 13  --   --   --   --   ALKPHOS 58  --   --   --   --   BILITOT 0.9  --   --   --   --  PROT 4.5*  --   --   --   --   ALBUMIN 1.7*   < > 1.8* 1.7* 1.6*   < > = values in this interval not displayed.   No results for input(s): LIPASE, AMYLASE in the last 168 hours. No results for input(s): AMMONIA in the last 168 hours. CBC: Recent Labs  Lab 01/05/20 0758 01/05/20 0758 01/06/20 0054 01/06/20 0054 01/07/20 0023 01/07/20 0023 01/08/20 0534 01/08/20 0534 01/09/20 0123 01/10/20 0059 01/11/20 0108  WBC 8.8   < > 9.4   < > 8.2  --  10.0  --   --   --  9.4  HGB 7.4*   < > 8.2*   < > 7.7*   < > 8.1*   < > 7.5* 7.2* 7.8*  HCT 22.6*   < > 26.1*   < > 24.9*   < > 26.1*   < > 24.7* 24.1* 25.5*  MCV 89.7  --  90.9  --  89.2  --  91.9  --   --   --  93.8  PLT 140*   < > 148*   < > 124*  --  125*  --   --   --  106*   < > = values in this interval not displayed.   Cardiac  Enzymes: No results for input(s): CKTOTAL, CKMB, CKMBINDEX, TROPONINI in the last 168 hours. CBG: Recent Labs  Lab 01/10/20 0557 01/10/20 1132 01/10/20 1625 01/10/20 2055 01/11/20 0610  GLUCAP 388* 258* 310* 463* 308*    Iron Studies: No results for input(s): IRON, TIBC, TRANSFERRIN, FERRITIN in the last 72 hours. Studies/Results: No results found.  Medications: Infusions: . ceFEPime (MAXIPIME) IV 2 g (01/10/20 1323)  . heparin 1,350 Units/hr (01/10/20 2357)  . vancomycin      Scheduled Medications: . amiodarone  400 mg Oral BID   Followed by  . [START ON 01/13/2020] amiodarone  200 mg Oral Daily  . amLODipine  5 mg Oral Daily  . aspirin EC  81 mg Oral Daily  . atorvastatin  10 mg Oral Q M,W,F  . Chlorhexidine Gluconate Cloth  6 each Topical Q0600  . cholecalciferol  4,000 Units Oral Daily  . darbepoetin (ARANESP) injection - DIALYSIS  60 mcg Intravenous Q Thu-HD  . insulin aspart  0-9 Units Subcutaneous TID WC  . insulin aspart  2 Units Subcutaneous TID WC  . insulin glargine  6 Units Subcutaneous Daily  . lipase/protease/amylase  12,000 Units Oral TID AC  . magnesium chloride  2 tablet Oral BID  . mycophenolate  180 mg Oral BID  . pantoprazole  40 mg Oral Daily  . predniSONE  5 mg Oral Q breakfast  . sertraline  50 mg Oral Daily  . tacrolimus  2 mg Oral BID  . tamsulosin  0.4 mg Oral QPC supper    have reviewed scheduled and prn medications.  Physical Exam: General:NAD, comfortable Heart:RRR, s1s2 nl Lungs: Clear b/l, no crackle Abdomen:soft, Non-tender, non-distended Extremities: Bilateral BKA, right BKA site has bandage applied. Dialysis Access: Right AV fistula has good thrill and bruit.  Erion Weightman Prasad Coden Franchi 01/11/2020,8:14 AM  LOS: 11 days  Pager: [phone]

## 2020-01-11 NOTE — Progress Notes (Signed)
Pharmacy Antibiotic Note  Paul [name] is a 64 y.o. male admitted on 12/30/2019 with osteomyelitis and has a history of CKD4, kidney transplant . Pharmacy has been consulted for vancomycin and cefepime dosing. He is noted with ESRD on HD and plans for R BKA revision this week.  -HD completed 8/21, plans are to continue TTS schedule  Currently afebrile, wbc wnl.   Plan: - Vancomycin 1000 mg IV TTS - Target a goal vancomycin trough of 15-20 mcg/mL - Continue Cefepime 2 g q24h  - Monitor renal function, clinical status, cultures and length of therapy - Deescalate therapy as needed   Height: 6' (182.9 cm) Weight: 65.9 kg (145 lb 4.5 oz) IBW/kg (Calculated) : 77.6  Temp (24hrs), Avg:98.4 F (36.9 C), Min:97.9 F (36.6 C), Max:98.7 F (37.1 C)  Recent Labs  Lab 01/05/20 0758 01/05/20 0758 01/06/20 0054 01/07/20 0023 01/08/20 0534 01/08/20 1142 01/08/20 1250 01/09/20 0123 01/10/20 0059 01/11/20 0108 01/11/20 0854  WBC 8.8  --  9.4 8.2 10.0  --   --   --   --  9.4  --   CREATININE 3.50*   < > 3.91*  --  3.59*  --   --  2.29* 3.37*  --  4.17*  VANCOTROUGH  --   --   --   --   --  4* 12*  --   --   --   --    < > = values in this interval not displayed.    Estimated Creatinine Clearance: 16.2 mL/min (A) (by C-G formula based on SCr of 4.17 mg/dL (H)).    Allergies  Allergen Reactions  . Metformin And Related Other (See Comments)    Has kidney problems    Antimicrobials this admission: 8/13 vancomycin >> 8/13 cefepime >>  Microbiology results: None  Erin Hearing PharmD., BCPS Clinical Pharmacist 01/11/2020 10:21 AM

## 2020-01-11 NOTE — Progress Notes (Addendum)
PROGRESS NOTE    TRUETT MCFARLAN  KZS:[id number] DOB: [birth year] DOA: 12/30/2019 PCP: [name], MD   Brief Narrative:  HPI on 12/31/2019 by Dr. Wynetta Fines Paul [name] is a 64 y.o. male with medical history significant of kidney transplant recipient (2016) with CKD stage IV, hypertension, IDDM, chronic A. fib on Xarelto, B/L BKA secondary to PVD, presented with new onset of right BKA stump pain and rash.  Symptoms started about 1 week ago, patient attributed that to a unfit prosthetic which might caused a skin abrasions.  He started to notice significant pain rash associated the end of right leg stump about 3 days ago.  Pain has been constant and gradually getting worse, denies any fever chills.  He took some Tylenol for the pain without any relief.  Patient has been following with [facility] for the kidney transplant, he was seen in the office about 2 weeks ago and multiple blood work and urine analysis sent, urine protein/Cre>5,000, patient however was not informed about results yet.  And patient claims his kidney function has been stable, denies any back pain, decreased urine output or urine color.  Interim history  Patient was admitted with sepsis secondary to right leg osteomyelitis with an abscess on BKA stump.  She was placed on IV antibiotics with vancomycin and cefepime.  Patient will need incision and drainage.  Also noted to have atrial fibrillation with RVR, cardiology consulted.  Nephrology consulted patient is having AKI on CKD, stage IV. Pending revision of amputation 01/12/2020  Assessment & Plan   Severe sepsis secondary to right leg osteomyelitis and abscess on BKA stump site -Sepsis was present on admission -CT scan of lower extremity showed evidence of early osteomyelitis and possible abscess of source of infection -Continue vancomycin and cefepime -Orthopedic surgery consulted and appreciated, recommended MRI of the affected extremity-which showed no findings  of osteomyelitis in the distal femur, patella or 60 m of tibia.  Unfortunately patient had cooperation issues therefore the distal 2 cm of the tibia/fibula and overlying stump were not included in the imaging, especially hyperdense collection noted on CT scan on 12/31/2019.  Small knee effusion, skeptical of septic joint given lack of synovitis. -CRP mildly elevated -Patient may need incision and drainage -Orthopedics, Dr. Sharol Given, recommending revision of R BKA when patient is medically stable - which he is currently stable  -Discussed with Dr. Sharol Given 8/23, patient will be placed on the schedule for 7/32/2025  Acute metabolic encephalopathy -likely secondary to the above -Resolved, patient currently alert and oriented x3  Atrial fibrillation with RVR -Patient with chronic atrial fibrillation -Noted to have tachycardia with hypotension -Currently in sinus rhythm  -Cardiology consulted and appreciated -was placed on amiodarone drip- cardiology is transitioning to oral for 1 months course, starting with 400mg  BID for 7 days, followed by 200mg  for 21 days (starting 8/19) -Eliquis has been held, currently on heparin drip  Acute kidney injury on chronic kidney disease, stage IV with severe metabolic acidosis  -history of renal transplant -Nephrology consulted and appreciated, patient tolerated HD -Patient received bicarb for metabolic acidosis -Continue tacrolimus, prednisone, mycophenolate- will speak to pharmacy about changing these medications to IV -last HD 01/08/2020  -Creatinine 4.17 -Patient does have a dialysis access as well as an outpatient slot.  However wonder if this will be a long-term dialysis.   -Had 650 cc of output overnight as per RN -Discussed with nephrology this morning, dialysis today -Continue to monitor BMP  Hypokalemia -Resolved, currently  4.6, continue to monitor BMP  Diabetes mellitus, type II, uncontrolled with hyperglycemia -Hemoglobin A1c 10.3 -patient is on  low-dose prednisone -Continue insulin sliding scale and CBG monitoring  Mild hypomagnesemia  -Resolved with replacement, continue to monitor and replace as needed  History of pancreatic insufficiency -Patient had duodenal adenocarcinoma status post resection -Continue Creon which was added on 01/01/2020  Essential hypertension -Patient was mildly hypotensive -Clonidine held -Continue hydralazine with holding parameters  Hyperlipidemia -continue statin  Anemia of chronic disease -Hemoglobin 7.8 today -Continue to monitor CBC and transfuse as needed  DVT Prophylaxis Heparin drip  Code Status: Full  Family Communication: None at bedside. Wife via phone on 01/09/20  Disposition Plan:  Status is: Inpatient  Remains inpatient appropriate because:Hemodynamically unstable, Persistent severe electrolyte disturbances, Ongoing diagnostic testing needed not appropriate for outpatient work up and Inpatient level of care appropriate due to severity of illness   Dispo:  Patient From: Home  Planned Disposition: Home with Health Care Svc  Expected discharge date: TBD  Medically stable for discharge: No   Consultants Orthopedic surgery Cardiology Nephrology  Procedures  None  Antibiotics   Anti-infectives (From admission, onward)   Start     Dose/Rate Route Frequency Ordered Stop   01/11/20 1200  vancomycin (VANCOCIN) IVPB 1000 mg/200 mL premix        1,000 mg 200 mL/hr over 60 Minutes Intravenous Every T-Th-Sa (Hemodialysis) 01/08/20 1422     01/08/20 1041  Vancomycin (VANCOCIN) 750-5 MG/150ML-% IVPB       Note to Pharmacy: Herriott, Melisa   : cabinet override      01/08/20 1041 01/08/20 1118   01/06/20 1200  vancomycin (VANCOCIN) IVPB 750 mg/150 ml premix  Status:  Discontinued        750 mg 150 mL/hr over 60 Minutes Intravenous Every T-Th-Sa (Hemodialysis) 01/05/20 1431 01/08/20 1422   01/04/20 1130  vancomycin (VANCOCIN) 1-5 GM/200ML-% IVPB       Note to Pharmacy:  Murriel Hopper   : cabinet override      01/04/20 1130 01/04/20 1333   12/31/19 1400  vancomycin (VANCOCIN) IVPB 1000 mg/200 mL premix  Status:  Discontinued        1,000 mg 200 mL/hr over 60 Minutes Intravenous Every 48 hours 12/31/19 1347 01/05/20 1431   12/31/19 1345  ceFEPIme (MAXIPIME) 2 g in sodium chloride 0.9 % 100 mL IVPB        2 g 200 mL/hr over 30 Minutes Intravenous Every 24 hours 12/31/19 1347     12/31/19 1330  vancomycin (VANCOCIN) IVPB 1000 mg/200 mL premix  Status:  Discontinued        1,000 mg 200 mL/hr over 60 Minutes Intravenous  Once 12/31/19 1315 12/31/19 1347      Subjective:   [name] seen and examined today.  Patient states he is feeling better this morning.  Continues to have some tenderness in the right stump.  Denies current chest pain or shortness of breath, abdominal pain, nausea or vomiting.  Objective:   Vitals:   01/11/20 0300 01/11/20 0403 01/11/20 0750 01/11/20 1124  BP:  (!) 143/68 (!) 112/59 (!) 119/54  Pulse: (!) 54 (!) 56 (!) 58 (!) 53  Resp: 16 13 16 12   Temp:  97.9 F (36.6 C) 98.6 F (37 C) 98.2 F (36.8 C)  TempSrc:  Axillary Oral Oral  SpO2: 98%  95% 99%  Weight:      Height:        Intake/Output Summary (Last  24 hours) at 01/11/2020 1159 Last data filed at 01/11/2020 0800 Gross per 24 hour  Intake 783.01 ml  Output 850 ml  Net -66.99 ml   Filed Weights   01/06/20 1815 01/08/20 0840 01/08/20 1215  Weight: 65.1 kg 66.7 kg 65.9 kg   Exam  General: Well developed, well nourished, NAD, appears stated age  41: NCAT, mucous membranes moist.   Cardiovascular: S1 S2 auscultated, SEM, RRR  Respiratory: Clear to auscultation bilaterally, no wheezing  Abdomen: Soft, nontender, nondistended, + bowel sounds  Extremities: B/L BKA, right stump dressing in place, TTP  Neuro: AAOx3, nonfocal  Psych: appropriate mood and affect  Data Reviewed: I have personally reviewed following labs and imaging studies  CBC: Recent  Labs  Lab 01/05/20 0758 01/05/20 0758 01/06/20 0054 01/06/20 0054 01/07/20 0023 01/08/20 0534 01/09/20 0123 01/10/20 0059 01/11/20 0108  WBC 8.8  --  9.4  --  8.2 10.0  --   --  9.4  HGB 7.4*   < > 8.2*   < > 7.7* 8.1* 7.5* 7.2* 7.8*  HCT 22.6*   < > 26.1*   < > 24.9* 26.1* 24.7* 24.1* 25.5*  MCV 89.7  --  90.9  --  89.2 91.9  --   --  93.8  PLT 140*  --  148*  --  124* 125*  --   --  106*   < > = values in this interval not displayed.   Basic Metabolic Panel: Recent Labs  Lab 01/05/20 0758 01/05/20 0758 01/06/20 0054 01/08/20 0534 01/09/20 0123 01/10/20 0059 01/11/20 0854  NA 140   < > 140 136 134* 133* 132*  K 3.6   < > 4.1 3.6 4.4 5.1 4.6  CL 104   < > 105 101 101 102 103  CO2 25   < > 21* 20* 27 22 19*  GLUCOSE 151*   < > 130* 230* 311* 406* 293*  BUN 41*   < > 49* 39* 21 35* 54*  CREATININE 3.50*   < > 3.91* 3.59* 2.29* 3.37* 4.17*  CALCIUM 7.9*   < > 8.1* 8.3* 7.9* 8.0* 8.3*  MG 2.0  --   --   --  2.0  --   --   PHOS  --   --  5.9* 3.7  --  1.9* 1.8*   < > = values in this interval not displayed.   GFR: Estimated Creatinine Clearance: 16.2 mL/min (A) (by C-G formula based on SCr of 4.17 mg/dL (H)). Liver Function Tests: Recent Labs  Lab 01/05/20 0758 01/06/20 0054 01/08/20 0534 01/10/20 0059 01/11/20 0854  AST 13*  --   --   --   --   ALT 13  --   --   --   --   ALKPHOS 58  --   --   --   --   BILITOT 0.9  --   --   --   --   PROT 4.5*  --   --   --   --   ALBUMIN 1.7* 1.8* 1.7* 1.6* 1.6*   No results for input(s): LIPASE, AMYLASE in the last 168 hours. No results for input(s): AMMONIA in the last 168 hours. Coagulation Profile: No results for input(s): INR, PROTIME in the last 168 hours. Cardiac Enzymes: No results for input(s): CKTOTAL, CKMB, CKMBINDEX, TROPONINI in the last 168 hours. BNP (last 3 results) No results for input(s): PROBNP in the last 8760 hours. HbA1C: No results for  input(s): HGBA1C in the last 72 hours. CBG: Recent Labs   Lab 01/10/20 1132 01/10/20 1625 01/10/20 2055 01/11/20 0610 01/11/20 1126  GLUCAP 258* 310* 463* 308* 237*   Lipid Profile: No results for input(s): CHOL, HDL, LDLCALC, TRIG, CHOLHDL, LDLDIRECT in the last 72 hours. Thyroid Function Tests: No results for input(s): TSH, T4TOTAL, FREET4, T3FREE, THYROIDAB in the last 72 hours. Anemia Panel: No results for input(s): VITAMINB12, FOLATE, FERRITIN, TIBC, IRON, RETICCTPCT in the last 72 hours. Urine analysis:    Component Value Date/Time   COLORURINE YELLOW 12/31/2019 1420   APPEARANCEUR HAZY (A) 12/31/2019 1420   LABSPEC 1.010 12/31/2019 1420   PHURINE 5.0 12/31/2019 1420   GLUCOSEU 150 (A) 12/31/2019 1420   HGBUR MODERATE (A) 12/31/2019 1420   BILIRUBINUR NEGATIVE 12/31/2019 1420   KETONESUR NEGATIVE 12/31/2019 1420   PROTEINUR >=300 (A) 12/31/2019 1420   UROBILINOGEN 0.2 08/02/2014 1424   NITRITE NEGATIVE 12/31/2019 1420   LEUKOCYTESUR MODERATE (A) 12/31/2019 1420   Sepsis Labs: @LABRCNTIP (procalcitonin:4,lacticidven:4)  ) No results found for this or any previous visit (from the past 240 hour(s)).    Radiology Studies: No results found.   Scheduled Meds: . amiodarone  400 mg Oral BID   Followed by  . [START ON 01/13/2020] amiodarone  200 mg Oral Daily  . amLODipine  5 mg Oral Daily  . aspirin EC  81 mg Oral Daily  . atorvastatin  10 mg Oral Q M,W,F  . Chlorhexidine Gluconate Cloth  6 each Topical Q0600  . Chlorhexidine Gluconate Cloth  6 each Topical Q0600  . cholecalciferol  4,000 Units Oral Daily  . darbepoetin (ARANESP) injection - DIALYSIS  60 mcg Intravenous Q Thu-HD  . insulin aspart  0-9 Units Subcutaneous TID WC  . insulin aspart  2 Units Subcutaneous TID WC  . insulin glargine  6 Units Subcutaneous Daily  . lipase/protease/amylase  12,000 Units Oral TID AC  . magnesium chloride  2 tablet Oral BID  . mupirocin ointment  1 application Nasal BID  . mycophenolate  180 mg Oral BID  . pantoprazole  40 mg  Oral Daily  . predniSONE  5 mg Oral Q breakfast  . sertraline  50 mg Oral Daily  . tacrolimus  2 mg Oral BID  . tamsulosin  0.4 mg Oral QPC supper   Continuous Infusions: . ceFEPime (MAXIPIME) IV 2 g (01/10/20 1323)  . heparin 1,350 Units/hr (01/10/20 2357)  . vancomycin       LOS: 11 days   Time Spent in minutes   30 minutes  Halden Phegley D.O. on 01/11/2020 at 11:59 AM  Between 7am to 7pm - Please see pager noted on [email or web address]  After 7pm go to [email or web address]  And look for the night coverage person covering for me after hours  [facility] Office  [phone]

## 2020-01-12 ENCOUNTER — Inpatient Hospital Stay (HOSPITAL_COMMUNITY): Payer: Medicare Other | Admitting: Anesthesiology

## 2020-01-12 ENCOUNTER — Ambulatory Visit: Payer: Managed Care, Other (non HMO) | Admitting: Rehabilitation

## 2020-01-12 ENCOUNTER — Encounter (HOSPITAL_COMMUNITY)
Admission: EM | Disposition: A | Discharge status: Rehabilitation Facility | Payer: Self-pay | Source: Home / Self Care | Attending: Internal Medicine

## 2020-01-12 ENCOUNTER — Encounter (HOSPITAL_COMMUNITY): Payer: Self-pay | Admitting: Internal Medicine

## 2020-01-12 DIAGNOSIS — T8743 Infection of amputation stump, right lower extremity: Principal | ICD-10-CM

## 2020-01-12 HISTORY — PX: STUMP REVISION: SHX6102

## 2020-01-12 LAB — CBC
HCT: 26.2 % — ABNORMAL LOW (ref 39.0–52.0)
Hemoglobin: 8.1 g/dL — ABNORMAL LOW (ref 13.0–17.0)
MCH: 28.8 pg (ref 26.0–34.0)
MCHC: 30.9 g/dL (ref 30.0–36.0)
MCV: 93.2 fL (ref 80.0–100.0)
Platelets: 127 10*3/uL — ABNORMAL LOW (ref 150–400)
RBC: 2.81 MIL/uL — ABNORMAL LOW (ref 4.22–5.81)
RDW: 14.1 % (ref 11.5–15.5)
WBC: 11.4 10*3/uL — ABNORMAL HIGH (ref 4.0–10.5)
nRBC: 0 % (ref 0.0–0.2)

## 2020-01-12 LAB — SURGICAL PCR SCREEN
MRSA, PCR: NEGATIVE
Staphylococcus aureus: POSITIVE — AB

## 2020-01-12 LAB — HEPARIN LEVEL (UNFRACTIONATED): Heparin Unfractionated: 0.61 IU/mL (ref 0.30–0.70)

## 2020-01-12 LAB — RENAL FUNCTION PANEL
Albumin: 1.6 g/dL — ABNORMAL LOW (ref 3.5–5.0)
Anion gap: 9 (ref 5–15)
BUN: 25 mg/dL — ABNORMAL HIGH (ref 8–23)
CO2: 25 mmol/L (ref 22–32)
Calcium: 8.1 mg/dL — ABNORMAL LOW (ref 8.9–10.3)
Chloride: 103 mmol/L (ref 98–111)
Creatinine, Ser: 2.48 mg/dL — ABNORMAL HIGH (ref 0.61–1.24)
GFR calc Af Amer: 30 mL/min — ABNORMAL LOW (ref >60)
GFR calc non Af Amer: 26 mL/min — ABNORMAL LOW (ref >60)
Glucose, Bld: 195 mg/dL — ABNORMAL HIGH (ref 70–99)
Phosphorus: 1.6 mg/dL — ABNORMAL LOW (ref 2.5–4.6)
Potassium: 4 mmol/L (ref 3.5–5.1)
Sodium: 137 mmol/L (ref 135–145)

## 2020-01-12 LAB — GLUCOSE, CAPILLARY
Glucose-Capillary: 157 mg/dL — ABNORMAL HIGH (ref 70–99)
Glucose-Capillary: 185 mg/dL — ABNORMAL HIGH (ref 70–99)
Glucose-Capillary: 103 mg/dL — ABNORMAL HIGH (ref 70–99)
Glucose-Capillary: 129 mg/dL — ABNORMAL HIGH (ref 70–99)
Glucose-Capillary: 186 mg/dL — ABNORMAL HIGH (ref 70–99)
Glucose-Capillary: 188 mg/dL — ABNORMAL HIGH (ref 70–99)

## 2020-01-12 LAB — HEMOGLOBIN AND HEMATOCRIT, BLOOD
HCT: 24.3 % — ABNORMAL LOW (ref 39.0–52.0)
Hemoglobin: 7.3 g/dL — ABNORMAL LOW (ref 13.0–17.0)

## 2020-01-12 LAB — PARATHYROID HORMONE, INTACT (NO CA): PTH: 43 pg/mL (ref 15–65)

## 2020-01-12 SURGERY — REVISION, AMPUTATION SITE
Anesthesia: General | Site: Leg Lower | Laterality: Right

## 2020-01-12 MED ORDER — CHLORHEXIDINE GLUCONATE CLOTH 2 % EX PADS
6.0000 | MEDICATED_PAD | Freq: Every day | CUTANEOUS | Status: DC
  Administered 2020-01-13 – 2020-01-18 (×5): 6 via TOPICAL

## 2020-01-12 MED ORDER — SODIUM CHLORIDE 0.9 % IV BOLUS
250.0000 mL | Freq: Once | INTRAVENOUS | Status: AC
  Administered 2020-01-12: 250 mL via INTRAVENOUS

## 2020-01-12 MED ORDER — LIDOCAINE 2% (20 MG/ML) 5 ML SYRINGE
INTRAMUSCULAR | Status: DC | PRN
  Administered 2020-01-12: 60 mg via INTRAVENOUS

## 2020-01-12 MED ORDER — CHLORHEXIDINE GLUCONATE 0.12 % MT SOLN: 15.0000 mL | Freq: Once | OROMUCOSAL | Status: AC

## 2020-01-12 MED ORDER — FENTANYL CITRATE (PF) 100 MCG/2ML IJ SOLN: 25.0000 ug | INTRAMUSCULAR | Status: DC | PRN

## 2020-01-12 MED ORDER — CHLORHEXIDINE GLUCONATE 0.12 % MT SOLN
OROMUCOSAL | Status: AC
  Administered 2020-01-12: 15 mL via OROMUCOSAL
  Filled 2020-01-12: qty 15

## 2020-01-12 MED ORDER — DOCUSATE SODIUM 100 MG PO CAPS
100.0000 mg | ORAL_CAPSULE | Freq: Two times a day (BID) | ORAL | Status: DC
  Administered 2020-01-12 – 2020-01-18 (×11): 100 mg via ORAL
  Filled 2020-01-12 (×12): qty 1

## 2020-01-12 MED ORDER — ONDANSETRON HCL 4 MG/2ML IJ SOLN
INTRAMUSCULAR | Status: DC | PRN
  Administered 2020-01-12: 4 mg via INTRAVENOUS

## 2020-01-12 MED ORDER — FENTANYL CITRATE (PF) 100 MCG/2ML IJ SOLN
INTRAMUSCULAR | Status: DC | PRN
  Administered 2020-01-12: 25 ug via INTRAVENOUS

## 2020-01-12 MED ORDER — ORAL CARE MOUTH RINSE: 15.0000 mL | Freq: Once | OROMUCOSAL | Status: AC

## 2020-01-12 MED ORDER — SODIUM CHLORIDE 0.9 % IV SOLN: INTRAVENOUS | Status: DC | PRN

## 2020-01-12 MED ORDER — EPHEDRINE SULFATE-NACL 50-0.9 MG/10ML-% IV SOSY
PREFILLED_SYRINGE | INTRAVENOUS | Status: DC | PRN
  Administered 2020-01-12 (×2): 15 mg via INTRAVENOUS

## 2020-01-12 MED ORDER — AMISULPRIDE (ANTIEMETIC) 5 MG/2ML IV SOLN: 10.0000 mg | Freq: Once | INTRAVENOUS | Status: DC | PRN

## 2020-01-12 MED ORDER — ALBUMIN HUMAN 25 % IV SOLN
25.0000 g | Freq: Once | INTRAVENOUS | Status: AC
  Administered 2020-01-12: 25 g via INTRAVENOUS
  Filled 2020-01-12: qty 100

## 2020-01-12 MED ORDER — MIDAZOLAM HCL 2 MG/2ML IJ SOLN
INTRAMUSCULAR | Status: AC
  Filled 2020-01-12: qty 2

## 2020-01-12 MED ORDER — SODIUM CHLORIDE 0.9 % IV SOLN: INTRAVENOUS | Status: DC

## 2020-01-12 MED ORDER — CEFAZOLIN SODIUM-DEXTROSE 2-4 GM/100ML-% IV SOLN: 2.0000 g | INTRAVENOUS | Status: DC

## 2020-01-12 MED ORDER — MIDAZOLAM HCL 2 MG/2ML IJ SOLN
INTRAMUSCULAR | Status: DC | PRN
  Administered 2020-01-12: .5 mg via INTRAVENOUS

## 2020-01-12 MED ORDER — PROPOFOL 10 MG/ML IV BOLUS
INTRAVENOUS | Status: DC | PRN
  Administered 2020-01-12: 150 mg via INTRAVENOUS

## 2020-01-12 MED ORDER — DEXAMETHASONE SODIUM PHOSPHATE 10 MG/ML IJ SOLN
INTRAMUSCULAR | Status: DC | PRN
  Administered 2020-01-12: 4 mg via INTRAVENOUS

## 2020-01-12 MED ORDER — CEFAZOLIN SODIUM-DEXTROSE 2-4 GM/100ML-% IV SOLN
2.0000 g | INTRAVENOUS | Status: DC
  Filled 2020-01-12: qty 100

## 2020-01-12 MED ORDER — 0.9 % SODIUM CHLORIDE (POUR BTL) OPTIME
TOPICAL | Status: DC | PRN
  Administered 2020-01-12: 1000 mL

## 2020-01-12 MED ORDER — OXYCODONE HCL 5 MG PO TABS
5.0000 mg | ORAL_TABLET | ORAL | Status: DC | PRN
  Administered 2020-01-18: 10 mg via ORAL
  Filled 2020-01-12: qty 2

## 2020-01-12 MED ORDER — FENTANYL CITRATE (PF) 250 MCG/5ML IJ SOLN
INTRAMUSCULAR | Status: AC
  Filled 2020-01-12: qty 5

## 2020-01-12 MED ORDER — SODIUM PHOSPHATES 45 MMOLE/15ML IV SOLN
20.0000 mmol | Freq: Once | INTRAVENOUS | Status: AC
  Administered 2020-01-12 (×2): 20 mmol via INTRAVENOUS
  Filled 2020-01-12: qty 6.67

## 2020-01-12 SURGICAL SUPPLY — 31 items
BLADE SAW RECIP 87.9 MT (BLADE) IMPLANT
BLADE SURG 21 STRL SS (BLADE) ×3 IMPLANT
CANISTER WOUND CARE 500ML ATS (WOUND CARE) ×3 IMPLANT
COVER SURGICAL LIGHT HANDLE (MISCELLANEOUS) ×3 IMPLANT
COVER WAND RF STERILE (DRAPES) ×1 IMPLANT
DRAPE EXTREMITY T 121X128X90 (DISPOSABLE) ×3 IMPLANT
DRAPE HALF SHEET 40X57 (DRAPES) ×3 IMPLANT
DRAPE INCISE IOBAN 66X45 STRL (DRAPES) ×3 IMPLANT
DRAPE U-SHAPE 47X51 STRL (DRAPES) ×6 IMPLANT
DRESSING PREVENA PLUS CUSTOM (GAUZE/BANDAGES/DRESSINGS) ×1 IMPLANT
DRSG PREVENA PLUS CUSTOM (GAUZE/BANDAGES/DRESSINGS) ×3
DURAPREP 26ML APPLICATOR (WOUND CARE) ×3 IMPLANT
ELECT REM PT RETURN 9FT ADLT (ELECTROSURGICAL) ×3
ELECTRODE REM PT RTRN 9FT ADLT (ELECTROSURGICAL) ×1 IMPLANT
GLOVE BIOGEL PI IND STRL 9 (GLOVE) ×1 IMPLANT
GLOVE BIOGEL PI INDICATOR 9 (GLOVE) ×2
GLOVE SURG ORTHO 9.0 STRL STRW (GLOVE) ×3 IMPLANT
GOWN STRL REUS W/ TWL XL LVL3 (GOWN DISPOSABLE) ×2 IMPLANT
GOWN STRL REUS W/TWL XL LVL3 (GOWN DISPOSABLE) ×6
KIT BASIN OR (CUSTOM PROCEDURE TRAY) ×3 IMPLANT
KIT TURNOVER KIT B (KITS) ×3 IMPLANT
MANIFOLD NEPTUNE II (INSTRUMENTS) ×3 IMPLANT
NS IRRIG 1000ML POUR BTL (IV SOLUTION) ×3 IMPLANT
PACK GENERAL/GYN (CUSTOM PROCEDURE TRAY) ×3 IMPLANT
PAD ARMBOARD 7.5X6 YLW CONV (MISCELLANEOUS) ×3 IMPLANT
PREVENA RESTOR ARTHOFORM 46X30 (CANNISTER) ×3 IMPLANT
STAPLER VISISTAT 35W (STAPLE) IMPLANT
SUT ETHILON 2 0 PSLX (SUTURE) ×6 IMPLANT
SUT SILK 2 0 (SUTURE)
SUT SILK 2-0 18XBRD TIE 12 (SUTURE) IMPLANT
TOWEL GREEN STERILE (TOWEL DISPOSABLE) ×3 IMPLANT

## 2020-01-12 NOTE — Progress Notes (Addendum)
Patient with 200 ccs of dark red fluid in wound vac.  Wound vac was placed at approx 3pm.  [place] not very concerned at this time.  Writer had turned off wound vac prior to paging MD.  He suggested leaving the wound vac turned off for 1 hours to see if it clots.  Will report this to night shift nurse.  Wound vac is off, heparin infusion turned off.  1930 Return call from [place].  Sharol Given wants W vac off, heparin off, elevated extremity, wrap with pressure.  Upon checking on patient after turning off wound vac, there is a very large collection of blood under the wound vac dressing.  Reviewed by other nurses and AD.  Dr Durward Fortes informed he must come in to see wound as it cannot wait until the morning.   Dr Durward Fortes said he would be coming  2005  Dr Durward Fortes at the bedside.  Plan to remove wound vac dressing and drain and get pressure to the bleeding site.  Assisted with pressure dressing after removal of wound vac dressing.  Steady oozing of the incision at the end of the right knee.  4 ABDs, kerlex wrap and ACE wrap to the leg.  Bleeding controlled at this time.  Planning for type and cross and stat H and H.    Estimated EBL since arrival from OR 500 ccs

## 2020-01-12 NOTE — Progress Notes (Addendum)
PROGRESS NOTE    Paul [name]  HQI:[id number] DOB: [birth year] DOA: 12/30/2019 PCP: [name], MD    Brief Narrative: HPI on 12/31/2019 by Dr. Ysidro Evert Bairdis a 64 y.o.malewith medical history significant ofkidney transplant recipient(2016)with CKD stage IV,hypertension, IDDM,chronic A. fib on Xarelto, B/L BKAsecondary to PVD, presented with new onset of rightBKA stump pain and rash.Symptoms started about 1 week ago, patient attributed that to a unfit prosthetic which might caused a skin abrasions.He started to notice significant pain rash associated the end of right leg stump about 3 days ago. Pain has been constant and gradually getting worse, denies any fever chills.He took some Tylenol for the pain without any relief.  Patient has been following with [facility] for the kidney transplant, he was seen in the office about 2 weeks ago and multiple blood work and urine analysis sent,urine protein/Cre>5,000,patient however was not informed about results yet. And patient claims his kidney function has been stable,denies any back pain, decreased urine output or urine color.   Assessment & Plan:   Principal Problem:   Sepsis ([place]) Active Problems:   ESRD on hemodialysis ([place])   Anemia   DM (diabetes mellitus) ([place])   S/P BKA (below knee amputation) bilateral ([facility])   AF (paroxysmal atrial fibrillation) (H[facility])   AKI (acute kidney injury) ([place])   CKD stage 4 due to type 2 diabetes mellitus ([place])   Cellulitis   Amputation stump infection ([place])   Subacute osteomyelitis of right tibia (H[facility])   Dehiscence of amputation stump ([place])   #1 sepsis present on admission secondary to right stump osteomyelitis and abscess status post revision 01/12/2020. On vancomycin and cefepime.  #2 A. fib with RVR patient has chronic atrial fibrillation.  Seen by cardiology started on amiodarone.  Eliquis on hold for the surgery.  On heparin drip.  #3 acute metabolic  encephalopathy seems to have resolved patient is awake alert oriented x3.  #4 AKI on CKD stage IV with severe metabolic acidosis patient with history of kidney transplant. On tacrolimus prednisone and mycophenolate Followed by nephrology Last dialysis 01/11/2020 plan for next dialysis 01/13/2020 Hemodialysis started during this hospital stay.  #5 history of essential hypertension on amlodipine.  Clonidine held  #6 type 2 diabetes with hyperglycemia uncontrolled hemoglobin A1c 10.3 patient is on chronic prednisone. CBG (last 3)  Recent Labs    01/12/20 0605 01/12/20 1148 01/12/20 1413  GLUCAP 185* 103* 129*   Continue SSI and lantus  #7 history of pancreatic insufficiency on Creon status post resection of the duodenal adenocarcinoma  #8 hyperlipidemia on statin  #9 anemia of chronic disease hb 8.1    Estimated body mass index is 21.17 kg/m as calculated from the following:   Height as of this encounter: 6' (1.829 m).   Weight as of this encounter: 70.8 kg.  DVT prophylaxis: Heparin Code Status: Full code Family Communication: None at bedside Disposition Plan:  Status is: Inpatient  Dispo:  Patient From: Home  Planned Disposition: Home with Health Care Svc  Expected discharge date: tbd  Medically stable for discharge: No    Consultants: Ortho, cardiology, nephrology Procedures: None Antimicrobials: Vanco and cefepime  Subjective: Seen earlier today resting in bed anxious to have surgery no complaints awake alert oriented  Objective: Vitals:   01/12/20 1445 01/12/20 1449 01/12/20 1455 01/12/20 1504  BP:  (!) 100/48 (!) 99/48 (!) 101/49  Pulse: 60 61 60 (!) 59  Resp: (!) 25 17 13 14   Temp:  98.1 F (36.7 C)   TempSrc:      SpO2: 96% 95% 94% 96%  Weight:      Height:        Intake/Output Summary (Last 24 hours) at 01/12/2020 1557 Last data filed at 01/12/2020 1410 Gross per 24 hour  Intake 548.3 ml  Output 1010 ml  Net -461.7 ml   Filed Weights    01/08/20 0840 01/08/20 1215 01/11/20 1441  Weight: 66.7 kg 65.9 kg 70.8 kg    Examination:  General exam: Appears calm and comfortable  Respiratory system: Clear to auscultation. Respiratory effort normal. Cardiovascular system: S1 & S2 heard, RRR. No JVD, murmurs, rubs, gallops or clicks. No pedal edema. Gastrointestinal system: Abdomen is nondistended, soft and nontender. No organomegaly or masses felt. Normal bowel sounds heard. Central nervous system: Alert and oriented. No focal neurological deficits. Extremities: Bilateral BKA right stump covered with dressing  skin: No rashes, lesions or ulcers Psychiatry: Judgement and insight appear normal. Mood & affect appropriate.     Data Reviewed: I have personally reviewed following labs and imaging studies  CBC: Recent Labs  Lab 01/06/20 0054 01/06/20 0054 01/07/20 0023 01/07/20 0023 01/08/20 0534 01/09/20 0123 01/10/20 0059 01/11/20 0108 01/12/20 0712  WBC 9.4  --  8.2  --  10.0  --   --  9.4 11.4*  HGB 8.2*   < > 7.7*   < > 8.1* 7.5* 7.2* 7.8* 8.1*  HCT 26.1*   < > 24.9*   < > 26.1* 24.7* 24.1* 25.5* 26.2*  MCV 90.9  --  89.2  --  91.9  --   --  93.8 93.2  PLT 148*  --  124*  --  125*  --   --  106* 127*   < > = values in this interval not displayed.   Basic Metabolic Panel: Recent Labs  Lab 01/06/20 0054 01/06/20 0054 01/08/20 0534 01/09/20 0123 01/10/20 0059 01/11/20 0854 01/12/20 0712  NA 140   < > 136 134* 133* 132* 137  K 4.1   < > 3.6 4.4 5.1 4.6 4.0  CL 105   < > 101 101 102 103 103  CO2 21*   < > 20* 27 22 19* 25  GLUCOSE 130*   < > 230* 311* 406* 293* 195*  BUN 49*   < > 39* 21 35* 54* 25*  CREATININE 3.91*   < > 3.59* 2.29* 3.37* 4.17* 2.48*  CALCIUM 8.1*   < > 8.3* 7.9* 8.0* 8.3* 8.1*  MG  --   --   --  2.0  --   --   --   PHOS 5.9*  --  3.7  --  1.9* 1.8* 1.6*   < > = values in this interval not displayed.   GFR: Estimated Creatinine Clearance: 29.3 mL/min (A) (by C-G formula based on SCr of  2.48 mg/dL (H)). Liver Function Tests: Recent Labs  Lab 01/06/20 0054 01/08/20 0534 01/10/20 0059 01/11/20 0854 01/12/20 0712  ALBUMIN 1.8* 1.7* 1.6* 1.6* 1.6*   No results for input(s): LIPASE, AMYLASE in the last 168 hours. No results for input(s): AMMONIA in the last 168 hours. Coagulation Profile: No results for input(s): INR, PROTIME in the last 168 hours. Cardiac Enzymes: No results for input(s): CKTOTAL, CKMB, CKMBINDEX, TROPONINI in the last 168 hours. BNP (last 3 results) No results for input(s): PROBNP in the last 8760 hours. HbA1C: No results for input(s): HGBA1C in the last 72 hours. CBG: Recent Labs  Lab 01/11/20 1913 01/11/20 2105 01/12/20 0605 01/12/20 1148 01/12/20 1413  GLUCAP 157* 166* 185* 103* 129*   Lipid Profile: No results for input(s): CHOL, HDL, LDLCALC, TRIG, CHOLHDL, LDLDIRECT in the last 72 hours. Thyroid Function Tests: No results for input(s): TSH, T4TOTAL, FREET4, T3FREE, THYROIDAB in the last 72 hours. Anemia Panel: No results for input(s): VITAMINB12, FOLATE, FERRITIN, TIBC, IRON, RETICCTPCT in the last 72 hours. Sepsis Labs: No results for input(s): PROCALCITON, LATICACIDVEN in the last 168 hours.  Recent Results (from the past 240 hour(s))  Surgical PCR screen     Status: Abnormal   Collection Time: 01/11/20 10:38 PM   Specimen: Nasal Mucosa; Nasal Swab  Result Value Ref Range Status   MRSA, PCR NEGATIVE NEGATIVE Final   Staphylococcus aureus POSITIVE (A) NEGATIVE Final    Comment: (NOTE) The Xpert SA Assay (FDA approved for NASAL specimens in patients 21 years of age and older), is one component of a comprehensive surveillance program. It is not intended to diagnose infection nor to guide or monitor treatment. Performed at [facility], [place] [address], [place], [place] [address]          Radiology Studies: No results found.      Scheduled Meds: . amiodarone  400 mg Oral BID   Followed by  . [START ON  01/13/2020] amiodarone  200 mg Oral Daily  . amLODipine  5 mg Oral Daily  . aspirin EC  81 mg Oral Daily  . atorvastatin  10 mg Oral Q M,W,F  . Chlorhexidine Gluconate Cloth  6 each Topical Q0600  . Chlorhexidine Gluconate Cloth  6 each Topical Q0600  . Chlorhexidine Gluconate Cloth  6 each Topical Q0600  . cholecalciferol  4,000 Units Oral Daily  . darbepoetin (ARANESP) injection - DIALYSIS  60 mcg Intravenous Q Thu-HD  . insulin aspart  0-9 Units Subcutaneous TID WC  . insulin aspart  2 Units Subcutaneous TID WC  . insulin glargine  6 Units Subcutaneous Daily  . lipase/protease/amylase  12,000 Units Oral TID AC  . magnesium chloride  2 tablet Oral BID  . mupirocin ointment  1 application Nasal BID  . mycophenolate  180 mg Oral BID  . pantoprazole  40 mg Oral Daily  . predniSONE  5 mg Oral Q breakfast  . sertraline  50 mg Oral Daily  . tacrolimus  2 mg Oral BID  . tamsulosin  0.4 mg Oral QPC supper   Continuous Infusions: . ceFEPime (MAXIPIME) IV 2 g (01/10/20 1323)  . heparin 1,350 Units/hr (01/12/20 1330)  . vancomycin 1,000 mg (01/11/20 1712)     LOS: 12 days     Georgette Shell, MD  01/12/2020, 3:57 PM

## 2020-01-12 NOTE — Progress Notes (Signed)
Patient via bed to short stay.  Reports was given to [place] on short stay

## 2020-01-12 NOTE — Anesthesia Preprocedure Evaluation (Signed)
Anesthesia Evaluation  Patient identified by MRN, date of birth, ID band Patient awake    Reviewed: Allergy & Precautions, NPO status , Patient's Chart, lab work & pertinent test results  Airway Mallampati: II  TM Distance: >3 FB Neck ROM: Full    Dental  (+) Dental Advisory Given   Pulmonary neg pulmonary ROS,    breath sounds clear to auscultation       Cardiovascular hypertension, Pt. on medications + Peripheral Vascular Disease   Rhythm:Regular Rate:Normal     Neuro/Psych CVA    GI/Hepatic Neg liver ROS, GERD  ,  Endo/Other  diabetes, Type 2  Renal/GU ESRF and DialysisRenal disease     Musculoskeletal   Abdominal   Peds  Hematology  (+) anemia ,   Anesthesia Other Findings   Reproductive/Obstetrics                             Anesthesia Physical Anesthesia Plan  ASA: III  Anesthesia Plan: General   Post-op Pain Management:    Induction: Intravenous  PONV Risk Score and Plan: 2 and Dexamethasone, Ondansetron and Treatment may vary due to age or medical condition  Airway Management Planned: LMA  Additional Equipment:   Intra-op Plan:   Post-operative Plan: Extubation in OR  Informed Consent: I have reviewed the patients History and Physical, chart, labs and discussed the procedure including the risks, benefits and alternatives for the proposed anesthesia with the patient or authorized representative who has indicated his/her understanding and acceptance.     Dental advisory given  Plan Discussed with: CRNA  Anesthesia Plan Comments:         Anesthesia Quick Evaluation

## 2020-01-12 NOTE — Op Note (Signed)
01/12/2020  3:09 PM  PATIENT:  Paul [name]    PRE-OPERATIVE DIAGNOSIS:  Osteomyelitis Right Below Knee Amputation  POST-OPERATIVE DIAGNOSIS:  Same  PROCEDURE:  REVISION RIGHT BELOW KNEE AMPUTATION Application of Prevena customizable and Arthur form wound VAC  SURGEON:  Newt Minion, MD  PHYSICIAN ASSISTANT:None ANESTHESIA:   General  PREOPERATIVE INDICATIONS:  Paul [name] is a  64 y.o. male with a diagnosis of Osteomyelitis Right Below Knee Amputation who failed conservative measures and elected for surgical management.    The risks benefits and alternatives were discussed with the patient preoperatively including but not limited to the risks of infection, bleeding, nerve injury, cardiopulmonary complications, the need for revision surgery, among others, and the patient was willing to proceed.  OPERATIVE IMPLANTS: Praveena customizable and Arthur form wound VAC.  @ENCIMAGES @  OPERATIVE FINDINGS: Patient had good petechial bleeding no deep abscess.  OPERATIVE PROCEDURE: Patient was brought the operating room after adequate levels anesthesia were obtained patient's right lower extremity was prepped using DuraPrep draped into a sterile field a timeout was called.  A fishmouth incision was made to encompass the ulcerative tissue.  This was carried sharply down to bone the distal centimeter of tibia and fibular were resected the tibia was beveled anteriorly.  The wound was irrigated with normal saline electrocautery was used for hemostasis.  The skin soft tissue and fascia were reapproximated with 2-0 nylon.  The closed incision was then covered with a customizable wound VAC that was covered with the Worthy Keeler form wound VAC.  This was sealed had a good suction fit patient was taken the PACU in stable condition.   DISCHARGE PLANNING:  Antibiotic duration: Antibiotics for 24 hours  Weightbearing: Nonweightbearing on the right  Pain medication: Opioid pathway  Dressing care/  Wound VAC: Continue wound VAC for 1 week  Ambulatory devices: Walker or crutches  Discharge to: Anticipate discharge to home.  Follow-up: In the office 1 week post operative.

## 2020-01-12 NOTE — Progress Notes (Signed)
Md paged regarding pts status and VS. New orders received. Will implement and continue to monitor pts progress.

## 2020-01-12 NOTE — Interval H&P Note (Signed)
History and Physical Interval Note:  01/12/2020 6:43 AM  Paul [name]  has presented today for surgery, with the diagnosis of Osteomyelitis Right Below Knee Amputation.  The various methods of treatment have been discussed with the patient and family. After consideration of risks, benefits and other options for treatment, the patient has consented to  Procedure(s): REVISION RIGHT BELOW KNEE AMPUTATION (Right) as a surgical intervention.  The patient's history has been reviewed, patient examined, no change in status, stable for surgery.  I have reviewed the patient's chart and labs.  Questions were answered to the patient's satisfaction.     Newt Minion

## 2020-01-12 NOTE — Transfer of Care (Signed)
Immediate Anesthesia Transfer of Care Note  Patient: LYRIC HOAR  Procedure(s) Performed: REVISION RIGHT BELOW KNEE AMPUTATION (Right Leg Lower)  Patient Location: PACU  Anesthesia Type:General  Level of Consciousness: drowsy  Airway & Oxygen Therapy: Patient Spontanous Breathing and Patient connected to nasal cannula oxygen  Post-op Assessment: Report given to RN and Post -op Vital signs reviewed and stable  Post vital signs: Reviewed and stable  Last Vitals:  Vitals Value Taken Time  BP 82/47 01/12/20 1410  Temp    Pulse 54 01/12/20 1411  Resp 11 01/12/20 1411  SpO2 100 % 01/12/20 1411  Vitals shown include unvalidated device data.  Last Pain:  Vitals:   01/12/20 1100  TempSrc: Oral  PainSc:       Patients Stated Pain Goal: 2 (97/53/00 5110)  Complications: No complications documented.

## 2020-01-12 NOTE — Progress Notes (Signed)
PATIENT ID: Paul [name]        MRN:  [id number]          DOB/AGE: 06/10/1955 / 64 y.o.    Paul Fears, MD   [name], PA-C [address] [place], [place]  [address]                             [phone]   PROGRESS NOTE  Subjective:  negative for Chest Pain  negative for Shortness of Breath  negative for Nausea/Vomiting   negative for Calf Pain    Tolerating Diet: yes         Patient reports pain as mild.     Dilaudid for pain  Objective: Vital signs in last 24 hours:   @IPVITALS @    Intake/Output from previous day:   08/24 0701 - 08/25 0700 In: 696.2 [P.O.:420; I.V.:276.2] Out: 1200 [Urine:200]   Intake/Output this shift:   @RRIOCURSHIFT @   Intake/Output      08/25 0701 - 08/26 0700   P.O.    I.V. (mL/kg) 400 (5.6)   Total Intake(mL/kg) 400 (5.6)   Urine (mL/kg/hr)    Other    Blood 10   Total Output 10   Net +390          LABORATORY DATA: Recent Labs    01/06/20 0054 01/07/20 0023 01/08/20 0534 01/09/20 0123 01/10/20 0059 01/11/20 0108 01/12/20 0712  WBC 9.4 8.2 10.0  --   --  9.4 11.4*  HGB 8.2* 7.7* 8.1* 7.5* 7.2* 7.8* 8.1*  HCT 26.1* 24.9* 26.1* 24.7* 24.1* 25.5* 26.2*  PLT 148* 124* 125*  --   --  106* 127*   Recent Labs    01/06/20 0054 01/08/20 0534 01/09/20 0123 01/10/20 0059 01/11/20 0854 01/12/20 0712  NA 140 136 134* 133* 132* 137  K 4.1 3.6 4.4 5.1 4.6 4.0  CL 105 101 101 102 103 103  CO2 21* 20* 27 22 19* 25  BUN 49* 39* 21 35* 54* 25*  CREATININE 3.91* 3.59* 2.29* 3.37* 4.17* 2.48*  GLUCOSE 130* 230* 311* 406* 293* 195*  CALCIUM 8.1* 8.3* 7.9* 8.0* 8.3* 8.1*   Lab Results  Component Value Date   INR 1.2 01/04/2020   INR 1.2 10/28/2018   INR 1.25 06/14/2013    Recent Radiographic Studies :  DG Chest 1 View  Result Date: 01/04/2020 CLINICAL DATA:  Pulmonary edema EXAM: CHEST  1 VIEW COMPARISON:  10/12/2019 FINDINGS: The heart size and mediastinal contours are within normal limits. Both lungs are clear.  The visualized skeletal structures are unremarkable. IMPRESSION: No active disease. Electronically Signed   By: Ulyses Jarred M.D.   On: 01/04/2020 01:21   US Renal Transplant w/Doppler  Result Date: 12/31/2019 CLINICAL DATA:  Acute renal failure. History of a renal transplantation. EXAM: ULTRASOUND OF RENAL TRANSPLANT WITH RENAL DOPPLER ULTRASOUND TECHNIQUE: Ultrasound examination of the renal transplant was performed with gray-scale, color and duplex doppler evaluation. COMPARISON:  CT, 04/23/2019. FINDINGS: Transplant kidney location: RLQ Transplant Kidney: Renal measurements: 11.1 x 4.3 x 5.2 cm = volume: 130.39mL. Normal in size and parenchymal echogenicity. No evidence of mass or hydronephrosis. Trace perinephric fluid adjacent to the lower pole. Color flow in the main renal artery:  Yes Color flow in the main renal vein:  Yes Duplex Doppler Evaluation: Main Renal Artery Resistive Index: 0.87. Velocity, peak systolic, 614.4 centimeters/second. Venous waveform in main renal vein:  Present, 19 centimeters/second  Intrarenal resistive index in upper pole:  0.74 (normal 0.6-0.8; equivocal 0.8-0.9; abnormal >= 0.9) Intrarenal resistive index in lower pole: 0.97 (normal 0.6-0.8; equivocal 0.8-0.9; abnormal >= 0.9) Bladder: Some debris.  No wall thickening, stone or mass. Other findings:  None. IMPRESSION: 1. Abnormally elevated resistive index in the lower pole of the transplant kidney with loss of the diastolic waveform. Resistive index measures 0.97. 2. Trace perinephric fluid adjacent to the lower pole. 3. No other abnormality. Electronically Signed   By: Lajean Manes M.D.   On: 12/31/2019 13:59   MR TIBIA FIBULA RIGHT WO CONTRAST  Result Date: 01/04/2020 CLINICAL DATA:  Possible osteomyelitis. EXAM: MRI OF LOWER RIGHT EXTREMITY WITHOUT CONTRAST TECHNIQUE: Multiplanar MR images of the remaining tibia/fibula were attempted. The has a below the knee amputation. There were cooperation issues and patient  motion related issues because of this the distal stump and part of the tibia and fibula were not included. The technologist reports that this was the best exam the could be achieved due to cooperation related issues. COMPARISON:  CT scan 12/31/2019 FINDINGS: Despite efforts by the technologist and patient, motion artifact is present on today's exam and could not be eliminated. This reduces exam sensitivity and specificity. Bones/Joint/Cartilage No findings of osteomyelitis in the distal femur, in the patella, or in the included proximal 6 cm of the tibia. Due to cooperation related issues, we did not visualize about 2 cm of the distal most tibia or the overlying stump. A knee effusion is present. No compelling degree of synovitis to suggest that this is from septic joint, and no abnormal marrow signal along the joint surfaces. Ligaments The anterior and posterior cruciate ligaments appear intact. Muscles and Tendons No unexpected findings. Soft tissues Mild diffuse subcutaneous edema. IMPRESSION: 1. No findings of osteomyelitis in the distal femur, patella, or in the included proximal 6 cm of the tibia. Please note that the patient cooperation related issues, the distal 2 cm of the tibia/fibula, and the overlying stump, were not included in imaging. This includes the hyperdense collection previously seen along the stump which was thought to potentially be a hematoma on the prior CT from 12/31/2019. 2. No drainable abscess. 3. Small knee effusion. I am skeptical of septic joint given the lack of synovitis. 4. Despite efforts by the technologist and patient, motion artifact is present on today's exam and could not be eliminated. This reduces exam sensitivity and specificity. Electronically Signed   By: Van Clines M.D.   On: 01/04/2020 20:16   CT EXTREMITY LOWER RIGHT WO CONTRAST  Result Date: 12/31/2019 CLINICAL DATA:  Pain and swelling at right BKA stump EXAM: CT OF THE LOWER RIGHT EXTREMITY WITHOUT  CONTRAST TECHNIQUE: Multidetector CT imaging of the right lower extremity was performed according to the standard protocol. COMPARISON:  None available FINDINGS: Bones/Joint/Cartilage Patient is status post below-knee amputation of the right lower extremity. Subtle loss of the cortical definition of the distal amputation margin of the tibia (series 9, image 47; series 7, image 82). The residual fibula appears intact with preservation of the distal cortex. The patella and visualized distal femur appear intact. Bones are demineralized. No acute fracture. No dislocation. Joint spaces are relatively preserved. Small nonspecific knee joint effusion. Ligaments Suboptimally assessed by CT. Collateral ligaments appear grossly intact. Muscles and Tendons No acute musculotendinous abnormality. Soft tissues Swelling and edema at the distal stump with possible cutaneous blister (series 8, image 74). There is a more focal slightly hyperdense collection at the posterolateral aspect  of the distal stump measuring approximately 2.6 x 1.7 x 2.6 cm (series 4, image 54), which could represent a hematoma. No soft tissue gas. Extensive vascular calcifications. IMPRESSION: 1. Status post below-knee amputation of the right lower extremity. Subtle loss of the cortical definition of the distal amputation margin of the tibia, raises the suspicion for early acute osteomyelitis. 2. Swelling and edema at the distal stump with possible cutaneous blister. There is a more focal hyperdense collection at the posterolateral aspect of the distal stump measuring approximately 2.6 x 1.7 x 2.6 cm, which could represent a hematoma. 3. Small nonspecific knee joint effusion. Electronically Signed   By: Davina Poke D.O.   On: 12/31/2019 13:23     Examination:  General appearance: no distress,pale  Assessment:   Called to evaluate bleeding from revised right BKA stump-vac removed and filled with blood.Minimal drainage from incision presently.  Cleaned with sterile saline and applied sterile bulky dressing. IV heparin discontinued. CBC and have nephrology assess for transfusion either tonight or at dialysis tomorrow.Comfortable. Monitor BP and VS. Elevate RLE                        [name], PA-C [place]  01/12/2020 8:26 PM

## 2020-01-12 NOTE — Progress Notes (Signed)
[facility] NEPHROLOGY PROGRESS NOTE  Assessment/ Plan: Pt is a 64 y.o. yo male with DDRT 2015 CKD4, AoCKD Afib DM HTN b/l BKA h/o duodenal adenocarcinoma s/p resection.Allograft biopsy which showed interstitial fibrosis andCIN.Currently on Myfortic, Tac and prednisone. Baseline Cr is2.8-3.4(as recently as 10/12/2019) but appears to be slowly worsening as an outpatient.  Admitted with right BKA site infection and sepsis, renal failure  # Acute kidney injury on CKD4, now progressed to ESRD: BL cr 2.8-3.4 s/p DDRT with DGF complicated by ACR and antibody mediated rejection requiring IVIG as recently as 2018. Started HD in the hospital.  No sign of renal recovery therefore we will continue TTS schedule for dialysis.  Had HD yesterday, tolerated well.  Plan for next HD tomorrow. He has outpatient dialysis set up for TTS at [facility].  Patient has functional dialysis access.  #Kidney transplant: Continue tacrolimus, mycophenolate, prednisone-tac tapered to 2/2 on 8/18 -> mycophenolate tapered to 180 mg twice daily on 8/22.  #Hypertension/volume: Continue amlodipine.  Managing volume with dialysis.  #Secondary hyperparathyroidism/hyperphosphatemia: PTH level 43.  Phosphorus remain low therefore I will replete phosphate.   #Anemia of CKD, acute illness: Continue Aranesp.  Iron saturation 81 and currently on antibiotics.  Hemoglobin stable, no need for transfusion.  #Severe sepsis due to right leg osteomyelitis/abscess on BKA stump site: Currently on vancomycin and cefepime.  Plan for surgery today by Dr. Sharol Given.  Subjective: Seen and examined at bedside.  No new event.  Denies nausea vomiting chest pain shortness of breath.  Plan for surgery today. Objective Vital signs in last 24 hours: Vitals:   01/11/20 1830 01/11/20 1940 01/11/20 2300 01/12/20 0454  BP: 130/72  (!) 119/55 (!) 123/59  Pulse: 70 64 (!) 53 (!) 55  Resp: 18 15 14 14   Temp: 97.9 F (36.6 C)   98.4 F (36.9  C)  TempSrc: Oral   Oral  SpO2: 98% 95% 98% 97%  Weight:      Height:       Weight change:   Intake/Output Summary (Last 24 hours) at 01/12/2020 0841 Last data filed at 01/11/2020 2300 Gross per 24 hour  Intake 418.63 ml  Output 1200 ml  Net -781.37 ml       Labs: Basic Metabolic Panel: Recent Labs  Lab 01/10/20 0059 01/11/20 0854 01/12/20 0712  NA 133* 132* 137  K 5.1 4.6 4.0  CL 102 103 103  CO2 22 19* 25  GLUCOSE 406* 293* 195*  BUN 35* 54* 25*  CREATININE 3.37* 4.17* 2.48*  CALCIUM 8.0* 8.3* 8.1*  PHOS 1.9* 1.8* 1.6*   Liver Function Tests: Recent Labs  Lab 01/10/20 0059 01/11/20 0854 01/12/20 0712  ALBUMIN 1.6* 1.6* 1.6*   No results for input(s): LIPASE, AMYLASE in the last 168 hours. No results for input(s): AMMONIA in the last 168 hours. CBC: Recent Labs  Lab 01/06/20 0054 01/06/20 0054 01/07/20 0023 01/07/20 0023 01/08/20 0534 01/09/20 0123 01/10/20 0059 01/11/20 0108 01/12/20 0712  WBC 9.4   < > 8.2   < > 10.0  --   --  9.4 11.4*  HGB 8.2*   < > 7.7*   < > 8.1*   < > 7.2* 7.8* 8.1*  HCT 26.1*   < > 24.9*   < > 26.1*   < > 24.1* 25.5* 26.2*  MCV 90.9  --  89.2  --  91.9  --   --  93.8 93.2  PLT 148*   < > 124*   < >  125*  --   --  106* 127*   < > = values in this interval not displayed.   Cardiac Enzymes: No results for input(s): CKTOTAL, CKMB, CKMBINDEX, TROPONINI in the last 168 hours. CBG: Recent Labs  Lab 01/10/20 2055 01/11/20 0610 01/11/20 1126 01/11/20 2105 01/12/20 0605  GLUCAP 463* 308* 237* 166* 185*    Iron Studies: No results for input(s): IRON, TIBC, TRANSFERRIN, FERRITIN in the last 72 hours. Studies/Results: No results found.  Medications: Infusions: .  ceFAZolin (ANCEF) IV    . ceFEPime (MAXIPIME) IV 2 g (01/10/20 1323)  . heparin 1,350 Units/hr (01/11/20 2132)  . vancomycin 1,000 mg (01/11/20 1712)    Scheduled Medications: . amiodarone  400 mg Oral BID   Followed by  . [START ON 01/13/2020]  amiodarone  200 mg Oral Daily  . amLODipine  5 mg Oral Daily  . aspirin EC  81 mg Oral Daily  . atorvastatin  10 mg Oral Q M,W,F  . Chlorhexidine Gluconate Cloth  6 each Topical Q0600  . Chlorhexidine Gluconate Cloth  6 each Topical Q0600  . cholecalciferol  4,000 Units Oral Daily  . darbepoetin (ARANESP) injection - DIALYSIS  60 mcg Intravenous Q Thu-HD  . insulin aspart  0-9 Units Subcutaneous TID WC  . insulin aspart  2 Units Subcutaneous TID WC  . insulin glargine  6 Units Subcutaneous Daily  . lipase/protease/amylase  12,000 Units Oral TID AC  . magnesium chloride  2 tablet Oral BID  . mupirocin ointment  1 application Nasal BID  . mycophenolate  180 mg Oral BID  . pantoprazole  40 mg Oral Daily  . predniSONE  5 mg Oral Q breakfast  . sertraline  50 mg Oral Daily  . tacrolimus  2 mg Oral BID  . tamsulosin  0.4 mg Oral QPC supper    have reviewed scheduled and prn medications.  Physical Exam: General:NAD, comfortable Heart:RRR, s1s2 nl Lungs: Clear b/l, no crackle Abdomen:soft, Non-tender, non-distended Extremities: Bilateral BKA, right BKA site has bandage applied. Dialysis Access: Right AV fistula has good thrill and bruit.  Paul [name] 01/12/2020,8:41 AM  LOS: 12 days  Pager: [phone]

## 2020-01-12 NOTE — Progress Notes (Signed)
Surgical PCR screening came back positive for Staph. Protocol is already in place for the patient. Will continue to monitor.

## 2020-01-12 NOTE — Anesthesia Procedure Notes (Signed)
Procedure Name: LMA Insertion Date/Time: 01/12/2020 1:39 PM Performed by: Leonor Liv, CRNA Pre-anesthesia Checklist: Patient identified, Emergency Drugs available, Suction available and Patient being monitored Patient Re-evaluated:Patient Re-evaluated prior to induction Oxygen Delivery Method: Circle System Utilized Preoxygenation: Pre-oxygenation with 100% oxygen Induction Type: IV induction LMA: LMA inserted LMA Size: 4.0 Number of attempts: 1 Airway Equipment and Method: Bite block Placement Confirmation: positive ETCO2 Tube secured with: Tape Dental Injury: Teeth and Oropharynx as per pre-operative assessment

## 2020-01-13 ENCOUNTER — Encounter (HOSPITAL_COMMUNITY): Payer: Self-pay | Admitting: Orthopedic Surgery

## 2020-01-13 LAB — CBC
HCT: 21.7 % — ABNORMAL LOW (ref 39.0–52.0)
HCT: 22.6 % — ABNORMAL LOW (ref 39.0–52.0)
Hemoglobin: 6.3 g/dL — CL (ref 13.0–17.0)
Hemoglobin: 7 g/dL — ABNORMAL LOW (ref 13.0–17.0)
MCH: 27.9 pg (ref 26.0–34.0)
MCH: 29.3 pg (ref 26.0–34.0)
MCHC: 29 g/dL — ABNORMAL LOW (ref 30.0–36.0)
MCHC: 31 g/dL (ref 30.0–36.0)
MCV: 96 fL (ref 80.0–100.0)
MCV: 94.6 fL (ref 80.0–100.0)
Platelets: 142 10*3/uL — ABNORMAL LOW (ref 150–400)
Platelets: 141 10*3/uL — ABNORMAL LOW (ref 150–400)
RBC: 2.26 MIL/uL — ABNORMAL LOW (ref 4.22–5.81)
RBC: 2.39 MIL/uL — ABNORMAL LOW (ref 4.22–5.81)
RDW: 14.6 % (ref 11.5–15.5)
RDW: 14.4 % (ref 11.5–15.5)
WBC: 16.3 10*3/uL — ABNORMAL HIGH (ref 4.0–10.5)
WBC: 22.6 10*3/uL — ABNORMAL HIGH (ref 4.0–10.5)
nRBC: 0 % (ref 0.0–0.2)
nRBC: 0 % (ref 0.0–0.2)

## 2020-01-13 LAB — GLUCOSE, CAPILLARY
Glucose-Capillary: 252 mg/dL — ABNORMAL HIGH (ref 70–99)
Glucose-Capillary: 141 mg/dL — ABNORMAL HIGH (ref 70–99)
Glucose-Capillary: 93 mg/dL (ref 70–99)

## 2020-01-13 LAB — PREPARE RBC (CROSSMATCH)

## 2020-01-13 LAB — HEPARIN LEVEL (UNFRACTIONATED): Heparin Unfractionated: <0.1 IU/mL — ABNORMAL LOW (ref 0.30–0.70)

## 2020-01-13 MED ORDER — SODIUM CHLORIDE 0.9% IV SOLUTION: Freq: Once | INTRAVENOUS | Status: AC

## 2020-01-13 NOTE — Progress Notes (Signed)
Patient ID: Paul [name], male   DOB: [birth year], 64 y.o.   MRN: [id number] Patient is postoperative day 1 revision transtibial amputation.  He did have acute postoperative bleeding secondary to his heparin therapy.  The heparin is stopped there is no active bleeding the dressing is dry.  Recommend continuing to hold heparin for 3 days.

## 2020-01-13 NOTE — Plan of Care (Signed)

## 2020-01-13 NOTE — Progress Notes (Signed)
[facility] NEPHROLOGY PROGRESS NOTE  Assessment/ Plan: Pt is a 64 y.o. yo male with DDRT 2015 CKD4, AoCKD Afib DM HTN b/l BKA h/o duodenal adenocarcinoma s/p resection.Allograft biopsy which showed interstitial fibrosis andCIN.Currently on Myfortic, Tac and prednisone. Baseline Cr is2.8-3.4(as recently as 10/12/2019) but appears to be slowly worsening as an outpatient.  Admitted with right BKA site infection and sepsis, renal failure  # Acute kidney injury on CKD4, now progressed to ESRD: BL cr 2.8-3.4 s/p DDRT with DGF complicated by ACR and antibody mediated rejection requiring IVIG as recently as 2018. Started HD in the hospital.  No sign of renal recovery therefore we will continue TTS schedule for dialysis.  Plan for dialysis today.  He has outpatient dialysis set up for TTS at [facility].  Patient has functional dialysis access.  #Kidney transplant: Continue tacrolimus, mycophenolate, prednisone-tac tapered to 2/2 on 8/18 -> mycophenolate tapered to 180 mg twice daily on 8/22.  #Hypertension/volume: Continue amlodipine.  Managing volume with dialysis.  #Secondary hyperparathyroidism/hyperphosphatemia: PTH level 43.  Phosphorus remain low therefore I will replete phosphate.   #Anemia of CKD, acute illness: Drop in hemoglobin due to bleeding at the surgical site.  Lost at least around 500 cc of blood.  Received a unit of blood transfusion today.  Continue Aranesp.  Iron saturation 81 and currently on antibiotics.   #Severe sepsis due to right leg osteomyelitis/abscess on BKA stump site: Currently on vancomycin and cefepime.  Status post revision of right below-knee amputation by Dr. Sharol Given on 8/25.  Subjective: Seen and examined at bedside.  Event noted.  Bleeding at the surgical site.  The wound VAC was changed.  Currently has dressing.  He denies nausea vomiting chest pain shortness of breath. Objective Vital signs in last 24 hours: Vitals:   01/13/20 0321 01/13/20  0333 01/13/20 0354 01/13/20 0609  BP: (!) 116/54  (!) 114/50 (!) 117/55  Pulse: 67  64   Resp: 11  10   Temp: 98 F (36.7 C)  97.8 F (36.6 C) 98.1 F (36.7 C)  TempSrc: Tympanic Oral Oral Oral  SpO2: 96%  98%   Weight:      Height:       Weight change:   Intake/Output Summary (Last 24 hours) at 01/13/2020 0726 Last data filed at 01/13/2020 0240 Gross per 24 hour  Intake 1030 ml  Output 10 ml  Net 1020 ml       Labs: Basic Metabolic Panel: Recent Labs  Lab 01/10/20 0059 01/11/20 0854 01/12/20 0712  NA 133* 132* 137  K 5.1 4.6 4.0  CL 102 103 103  CO2 22 19* 25  GLUCOSE 406* 293* 195*  BUN 35* 54* 25*  CREATININE 3.37* 4.17* 2.48*  CALCIUM 8.0* 8.3* 8.1*  PHOS 1.9* 1.8* 1.6*   Liver Function Tests: Recent Labs  Lab 01/10/20 0059 01/11/20 0854 01/12/20 0712  ALBUMIN 1.6* 1.6* 1.6*   No results for input(s): LIPASE, AMYLASE in the last 168 hours. No results for input(s): AMMONIA in the last 168 hours. CBC: Recent Labs  Lab 01/07/20 0023 01/07/20 0023 01/08/20 0534 01/09/20 0123 01/11/20 0108 01/11/20 0108 01/12/20 0712 01/12/20 2026 01/13/20 0042  WBC 8.2   < > 10.0  --  9.4  --  11.4*  --  16.3*  HGB 7.7*   < > 8.1*   < > 7.8*   < > 8.1* 7.3* 6.3*  HCT 24.9*   < > 26.1*   < > 25.5*   < >  26.2* 24.3* 21.7*  MCV 89.2  --  91.9  --  93.8  --  93.2  --  96.0  PLT 124*   < > 125*  --  106*  --  127*  --  142*   < > = values in this interval not displayed.   Cardiac Enzymes: No results for input(s): CKTOTAL, CKMB, CKMBINDEX, TROPONINI in the last 168 hours. CBG: Recent Labs  Lab 01/12/20 1148 01/12/20 1413 01/12/20 1649 01/12/20 2110 01/13/20 0616  GLUCAP 103* 129* 186* 188* 252*    Iron Studies: No results for input(s): IRON, TIBC, TRANSFERRIN, FERRITIN in the last 72 hours. Studies/Results: No results found.  Medications: Infusions: . sodium chloride 50 mL/hr at 01/12/20 2338  . ceFEPime (MAXIPIME) IV 2 g (01/10/20 1323)  .  vancomycin 1,000 mg (01/11/20 1712)    Scheduled Medications: . amiodarone  200 mg Oral Daily  . amLODipine  5 mg Oral Daily  . aspirin EC  81 mg Oral Daily  . atorvastatin  10 mg Oral Q M,W,F  . Chlorhexidine Gluconate Cloth  6 each Topical Q0600  . Chlorhexidine Gluconate Cloth  6 each Topical Q0600  . Chlorhexidine Gluconate Cloth  6 each Topical Q0600  . cholecalciferol  4,000 Units Oral Daily  . darbepoetin (ARANESP) injection - DIALYSIS  60 mcg Intravenous Q Thu-HD  . docusate sodium  100 mg Oral BID  . insulin aspart  0-9 Units Subcutaneous TID WC  . insulin aspart  2 Units Subcutaneous TID WC  . insulin glargine  6 Units Subcutaneous Daily  . lipase/protease/amylase  12,000 Units Oral TID AC  . magnesium chloride  2 tablet Oral BID  . mupirocin ointment  1 application Nasal BID  . mycophenolate  180 mg Oral BID  . pantoprazole  40 mg Oral Daily  . predniSONE  5 mg Oral Q breakfast  . sertraline  50 mg Oral Daily  . tacrolimus  2 mg Oral BID  . tamsulosin  0.4 mg Oral QPC supper    have reviewed scheduled and prn medications.  Physical Exam: General:NAD, comfortable Heart:RRR, s1s2 nl Lungs: Clear b/l, no crackle Abdomen:soft, Non-tender, non-distended Extremities: Bilateral BKA, right BKA site has bandage applied. Dialysis Access: Right AV fistula has good thrill and bruit.  Daliah Chaudoin Prasad Emir Nack 01/13/2020,7:26 AM  LOS: 13 days  Pager: [phone]

## 2020-01-13 NOTE — Plan of Care (Signed)
  Problem: Education: Goal: Knowledge of General Education information will improve Description: Including pain rating scale, medication(s)/side effects and non-pharmacologic comfort measures Outcome: Progressing   Problem: Clinical Measurements: Goal: Ability to maintain clinical measurements within normal limits will improve Outcome: Progressing Goal: Will remain free from infection Outcome: Progressing Goal: Diagnostic test results will improve Outcome: Progressing   Problem: Activity: Goal: Risk for activity intolerance will decrease Outcome: Progressing   Problem: Nutrition: Goal: Adequate nutrition will be maintained Outcome: Progressing   Problem: Elimination: Goal: Will not experience complications related to bowel motility Outcome: Progressing Goal: Will not experience complications related to urinary retention Outcome: Progressing   Problem: Safety: Goal: Ability to remain free from injury will improve Outcome: Progressing

## 2020-01-13 NOTE — Progress Notes (Signed)
PROGRESS NOTE    Paul MACEACHERN  EHM:[id number] DOB: 12-11-1955 DOA: 12/30/2019 PCP: [name], MD    Brief Narrative: HPI on 12/31/2019 by Dr. Ysidro Evert Bairdis a 64 y.o.malewith medical history significant ofkidney transplant recipient(2016)with CKD stage IV,hypertension, IDDM,chronic A. fib on Xarelto, B/L BKAsecondary to PVD, presented with new onset of rightBKA stump pain and rash.Symptoms started about 1 week ago, patient attributed that to a unfit prosthetic which might caused a skin abrasions.He started to notice significant pain rash associated the end of right leg stump about 3 days ago. Pain has been constant and gradually getting worse, denies any fever chills.He took some Tylenol for the pain without any relief.  Patient has been following with [facility] for the kidney transplant, he was seen in the office about 2 weeks ago and multiple blood work and urine analysis sent,urine protein/Cre>5,000,patient however was not informed about results yet. And patient claims his kidney function has been stable,denies any back pain, decreased urine output or urine color.   Assessment & Plan:   Principal Problem:   Sepsis ([place]) Active Problems:   ESRD on hemodialysis ([place])   Anemia   DM (diabetes mellitus) ([place])   S/P BKA (below knee amputation) bilateral ([facility])   AF (paroxysmal atrial fibrillation) ([facility])   AKI (acute kidney injury) ([place])   CKD stage 4 due to type 2 diabetes mellitus ([place])   Cellulitis   Amputation stump infection ([place])   Subacute osteomyelitis of right tibia ([facility])   Dehiscence of amputation stump ([place])   #1 sepsis present on admission secondary to right stump osteomyelitis and abscess status post revision 01/12/2020. On vancomycin and cefepime.  #2 A. fib with RVR patient has chronic atrial fibrillation.  Seen by cardiology started on amiodarone.  Eliquis on hold for the surgery.  On heparin drip.  #3 acute metabolic  encephalopathy seems to have resolved patient is awake alert oriented x3.  #4 AKI on CKD stage IV with severe metabolic acidosis patient with history of kidney transplant. On tacrolimus prednisone and mycophenolate Followed by nephrology For dialysis 01/13/2020.  He was started on dialysis during this hospital admission.  #5 history of essential hypertension on amlodipine.  Clonidine held  #6 type 2 diabetes with hyperglycemia uncontrolled hemoglobin A1c 10.3 patient is on chronic prednisone. CBG (last 3)  Recent Labs    01/12/20 2110 01/13/20 0616 01/13/20 1205  GLUCAP 188* 252* 141*    Continue SSI and lantus  #7 history of pancreatic insufficiency on Creon status post resection of the duodenal adenocarcinoma  #8 hyperlipidemia on statin  #9  Abla/anemia of chronic disease hemoglobin 6.3 received 1 unit of packed RBC hemoglobin up to 7.0.  Please recheck levels in a.m.    Estimated body mass index is 21.17 kg/m as calculated from the following:   Height as of this encounter: 6' (1.829 m).   Weight as of this encounter: 70.8 kg.  DVT prophylaxis: SCD Heparin stopped due to right stump bleeding  code Status: Full code Family Communication: None at bedside Disposition Plan:  Status is: Inpatient  Dispo:  Patient From: Home  Planned Disposition: Home with Health Care Svc  Expected discharge date: tbd  Medically stable for discharge: No    Consultants: Ortho, cardiology, nephrology Procedures: None Antimicrobials: Vanco and cefepime  Subjective: Patient had fresh bleeding from the right stump site overnight given 1 units of blood transfusion as her hemoglobin dropped to 6.3.  Hemoglobin up to 7.0.  Complains  of nausea vomited twice denies abdominal pain no BM for 3 days.  Objective: Vitals:   01/13/20 0354 01/13/20 0609 01/13/20 0802 01/13/20 1200  BP: (!) 114/50 (!) 117/55 (!) 113/51 131/60  Pulse: 64  62 (!) 58  Resp: 10  10 13   Temp: 97.8 F (36.6 C) 98.1 F  (36.7 C) 97.9 F (36.6 C) 98.2 F (36.8 C)  TempSrc: Oral Oral Oral Oral  SpO2: 98%  95% 97%  Weight:      Height:        Intake/Output Summary (Last 24 hours) at 01/13/2020 1458 Last data filed at 01/13/2020 5638 Gross per 24 hour  Intake 630 ml  Output --  Net 630 ml   Filed Weights   01/08/20 0840 01/08/20 1215 01/11/20 1441  Weight: 66.7 kg 65.9 kg 70.8 kg    Examination:  General exam: Appears calm and comfortable  Respiratory system: Clear to auscultation. Respiratory effort normal. Cardiovascular system: S1 & S2 heard, RRR. No JVD, murmurs, rubs, gallops or clicks. No pedal edema. Gastrointestinal system: Abdomen is nondistended, soft and nontender. No organomegaly or masses felt. Normal bowel sounds heard. Central nervous system: Alert and oriented. No focal neurological deficits. Extremities: Bilateral BKA right stump covered with dressing  skin: No rashes, lesions or ulcers Psychiatry: Judgement and insight appear normal. Mood & affect appropriate.     Data Reviewed: I have personally reviewed following labs and imaging studies  CBC: Recent Labs  Lab 01/08/20 0534 01/09/20 0123 01/11/20 0108 01/12/20 0712 01/12/20 2026 01/13/20 0042 01/13/20 0852  WBC 10.0  --  9.4 11.4*  --  16.3* 22.6*  HGB 8.1*   < > 7.8* 8.1* 7.3* 6.3* 7.0*  HCT 26.1*   < > 25.5* 26.2* 24.3* 21.7* 22.6*  MCV 91.9  --  93.8 93.2  --  96.0 94.6  PLT 125*  --  106* 127*  --  142* 141*   < > = values in this interval not displayed.   Basic Metabolic Panel: Recent Labs  Lab 01/08/20 0534 01/09/20 0123 01/10/20 0059 01/11/20 0854 01/12/20 0712  NA 136 134* 133* 132* 137  K 3.6 4.4 5.1 4.6 4.0  CL 101 101 102 103 103  CO2 20* 27 22 19* 25  GLUCOSE 230* 311* 406* 293* 195*  BUN 39* 21 35* 54* 25*  CREATININE 3.59* 2.29* 3.37* 4.17* 2.48*  CALCIUM 8.3* 7.9* 8.0* 8.3* 8.1*  MG  --  2.0  --   --   --   PHOS 3.7  --  1.9* 1.8* 1.6*   GFR: Estimated Creatinine Clearance: 29.3  mL/min (A) (by C-G formula based on SCr of 2.48 mg/dL (H)). Liver Function Tests: Recent Labs  Lab 01/08/20 0534 01/10/20 0059 01/11/20 0854 01/12/20 0712  ALBUMIN 1.7* 1.6* 1.6* 1.6*   No results for input(s): LIPASE, AMYLASE in the last 168 hours. No results for input(s): AMMONIA in the last 168 hours. Coagulation Profile: No results for input(s): INR, PROTIME in the last 168 hours. Cardiac Enzymes: No results for input(s): CKTOTAL, CKMB, CKMBINDEX, TROPONINI in the last 168 hours. BNP (last 3 results) No results for input(s): PROBNP in the last 8760 hours. HbA1C: No results for input(s): HGBA1C in the last 72 hours. CBG: Recent Labs  Lab 01/12/20 1413 01/12/20 1649 01/12/20 2110 01/13/20 0616 01/13/20 1205  GLUCAP 129* 186* 188* 252* 141*   Lipid Profile: No results for input(s): CHOL, HDL, LDLCALC, TRIG, CHOLHDL, LDLDIRECT in the last 72 hours. Thyroid Function Tests:  No results for input(s): TSH, T4TOTAL, FREET4, T3FREE, THYROIDAB in the last 72 hours. Anemia Panel: No results for input(s): VITAMINB12, FOLATE, FERRITIN, TIBC, IRON, RETICCTPCT in the last 72 hours. Sepsis Labs: No results for input(s): PROCALCITON, LATICACIDVEN in the last 168 hours.  Recent Results (from the past 240 hour(s))  Surgical PCR screen     Status: Abnormal   Collection Time: 01/11/20 10:38 PM   Specimen: Nasal Mucosa; Nasal Swab  Result Value Ref Range Status   MRSA, PCR NEGATIVE NEGATIVE Final   Staphylococcus aureus POSITIVE (A) NEGATIVE Final    Comment: (NOTE) The Xpert SA Assay (FDA approved for NASAL specimens in patients 42 years of age and older), is one component of a comprehensive surveillance program. It is not intended to diagnose infection nor to guide or monitor treatment. Performed at [facility], [place] [address] [place], [place] [address]          Radiology Studies: No results found.      Scheduled Meds: . amiodarone  200 mg Oral Daily  .  amLODipine  5 mg Oral Daily  . aspirin EC  81 mg Oral Daily  . atorvastatin  10 mg Oral Q M,W,F  . Chlorhexidine Gluconate Cloth  6 each Topical Q0600  . Chlorhexidine Gluconate Cloth  6 each Topical Q0600  . Chlorhexidine Gluconate Cloth  6 each Topical Q0600  . cholecalciferol  4,000 Units Oral Daily  . darbepoetin (ARANESP) injection - DIALYSIS  60 mcg Intravenous Q Thu-HD  . docusate sodium  100 mg Oral BID  . insulin aspart  0-9 Units Subcutaneous TID WC  . insulin aspart  2 Units Subcutaneous TID WC  . insulin glargine  6 Units Subcutaneous Daily  . lipase/protease/amylase  12,000 Units Oral TID AC  . magnesium chloride  2 tablet Oral BID  . mupirocin ointment  1 application Nasal BID  . mycophenolate  180 mg Oral BID  . pantoprazole  40 mg Oral Daily  . predniSONE  5 mg Oral Q breakfast  . sertraline  50 mg Oral Daily  . tacrolimus  2 mg Oral BID  . tamsulosin  0.4 mg Oral QPC supper   Continuous Infusions: . sodium chloride 50 mL/hr at 01/12/20 2338  . ceFEPime (MAXIPIME) IV 2 g (01/13/20 1347)  . vancomycin 1,000 mg (01/11/20 1712)     LOS: 13 days     Georgette Shell, MD  01/13/2020, 2:58 PM

## 2020-01-13 NOTE — Addendum Note (Signed)
Addendum  created 01/13/20 1456 by Suzette Battiest, MD   Intraprocedure Staff edited

## 2020-01-13 NOTE — Progress Notes (Signed)
Renal Navigator met with patient to check in and provide him with schedule letter and phone number for [facility] to schedule OP  HD transportation. Navigator will continue to follow for discharge date/start date in clinic.  Alphonzo Cruise, [place] Renal Navigator [phone]

## 2020-01-13 NOTE — Anesthesia Postprocedure Evaluation (Signed)
Anesthesia Post Note  Patient: Paul [name]  Procedure(s) Performed: REVISION RIGHT BELOW KNEE AMPUTATION (Right Leg Lower)     Patient location during evaluation: PACU Anesthesia Type: General Level of consciousness: awake and alert Pain management: pain level controlled Vital Signs Assessment: post-procedure vital signs reviewed and stable Respiratory status: spontaneous breathing, nonlabored ventilation, respiratory function stable and patient connected to nasal cannula oxygen Cardiovascular status: blood pressure returned to baseline and stable Postop Assessment: no apparent nausea or vomiting Anesthetic complications: no   No complications documented.  Last Vitals:  Vitals:   01/13/20 0802 01/13/20 1200  BP: (!) 113/51 131/60  Pulse: 62 (!) 58  Resp: 10 13  Temp: 36.6 C 36.8 C  SpO2: 95% 97%    Last Pain:  Vitals:   01/13/20 1200  TempSrc: Oral  PainSc:                  [name]

## 2020-01-13 NOTE — Plan of Care (Signed)
  Problem: Health Behavior/Discharge Planning: Goal: Ability to manage health-related needs will improve Outcome: Progressing   Problem: Clinical Measurements: Goal: Ability to maintain clinical measurements within normal limits will improve Outcome: Progressing Goal: Respiratory complications will improve Outcome: Progressing Goal: Cardiovascular complication will be avoided Outcome: Progressing   

## 2020-01-13 NOTE — Progress Notes (Signed)
PT Cancellation Note  Patient Details Name: Paul [name] MRN: [id number] DOB: [birth year]   Cancelled Treatment:    Reason Eval/Treat Not Completed: Other (comment). Pt reported he was going to HD very soon and that he didn't have his prosthetic for his LLE with him. Will follow up in AM.    [place] 01/13/2020, 3:58 PM [place] Pager [phone] Office [phone]

## 2020-01-14 ENCOUNTER — Ambulatory Visit: Payer: Managed Care, Other (non HMO)

## 2020-01-14 LAB — CBC
HCT: 19.6 % — ABNORMAL LOW (ref 39.0–52.0)
Hemoglobin: 5.9 g/dL — CL (ref 13.0–17.0)
MCH: 28.5 pg (ref 26.0–34.0)
MCHC: 30.1 g/dL (ref 30.0–36.0)
MCV: 94.7 fL (ref 80.0–100.0)
Platelets: 116 10*3/uL — ABNORMAL LOW (ref 150–400)
RBC: 2.07 MIL/uL — ABNORMAL LOW (ref 4.22–5.81)
RDW: 14.6 % (ref 11.5–15.5)
WBC: 9.7 10*3/uL (ref 4.0–10.5)
nRBC: 0 % (ref 0.0–0.2)

## 2020-01-14 LAB — GLUCOSE, CAPILLARY
Glucose-Capillary: 179 mg/dL — ABNORMAL HIGH (ref 70–99)
Glucose-Capillary: 231 mg/dL — ABNORMAL HIGH (ref 70–99)
Glucose-Capillary: 185 mg/dL — ABNORMAL HIGH (ref 70–99)
Glucose-Capillary: 161 mg/dL — ABNORMAL HIGH (ref 70–99)
Glucose-Capillary: 265 mg/dL — ABNORMAL HIGH (ref 70–99)

## 2020-01-14 LAB — PREPARE RBC (CROSSMATCH)

## 2020-01-14 LAB — RENAL FUNCTION PANEL
Albumin: 1.8 g/dL — ABNORMAL LOW (ref 3.5–5.0)
Anion gap: 7 (ref 5–15)
BUN: 17 mg/dL (ref 8–23)
CO2: 27 mmol/L (ref 22–32)
Calcium: 7.8 mg/dL — ABNORMAL LOW (ref 8.9–10.3)
Chloride: 103 mmol/L (ref 98–111)
Creatinine, Ser: 2.31 mg/dL — ABNORMAL HIGH (ref 0.61–1.24)
GFR calc Af Amer: 33 mL/min — ABNORMAL LOW (ref >60)
GFR calc non Af Amer: 28 mL/min — ABNORMAL LOW (ref >60)
Glucose, Bld: 195 mg/dL — ABNORMAL HIGH (ref 70–99)
Phosphorus: 2.7 mg/dL (ref 2.5–4.6)
Potassium: 4.2 mmol/L (ref 3.5–5.1)
Sodium: 137 mmol/L (ref 135–145)

## 2020-01-14 MED ORDER — SODIUM CHLORIDE 0.9% IV SOLUTION: Freq: Once | INTRAVENOUS | Status: AC

## 2020-01-14 MED ORDER — CHLORHEXIDINE GLUCONATE CLOTH 2 % EX PADS
6.0000 | MEDICATED_PAD | Freq: Every day | CUTANEOUS | Status: DC
  Administered 2020-01-15: 6 via TOPICAL

## 2020-01-14 NOTE — Progress Notes (Signed)
Rehab Admissions Coordinator Note:  Patient was screened by Cleatrice Burke for appropriateness for an Inpatient Acute Rehab Consult per PT recs. Noted wife to bring slide board and prosthesis. We will observe his progress over the weekend to assist in determining if Cir admit needed prior to d.c home .  Cleatrice Burke RN MSN 01/14/2020, 10:51 AM  I can be reached at [phone].

## 2020-01-14 NOTE — Progress Notes (Addendum)
Occupational Therapy Evaluation Patient Details Name: Paul [name] MRN: [id number] DOB: 06-Nov-1955 Today's Date: 01/14/2020    History of Present Illness Patient is a 64 y/o male who presents with sepsis secondary to RLE osteomyelitis and abscess at [place] stump site now s/p right BKA revision 8/25. PMH includes renal transplant, CKD stage IV, calciphylaxis, anemia, A-fib, duodenal adenocarcinoma s/p resection, DM, diabetic neuropathy, s/p bil BKAs, HTN.   Clinical Impression   PTA, pt modified independent with ADL and mobility. Eval limited today due to low Hgb and not feeling well. Overall mod A with ADL tasks. Wife brought in prosthetic and sliding board. Pt very motivated to return to higher level of function and wife is very supportive. Feel pt is an excellent CIR candidate. Will follow acutely.  Pt positioning R knee in flexion - educated pt/wife on importance of terminal extension to improve knee ROM for future prosthetic use. Pt verbalized understanding.    Follow Up Recommendations  CIR;Supervision/Assistance - 24 hour    Equipment Recommendations  None recommended by OT    Recommendations for Other Services Rehab consult     Precautions / Restrictions Precautions Precaution Comments: bil BKAs; wound vac; no pillow under R knee Restrictions Weight Bearing Restrictions: Yes RLE Weight Bearing: Non weight bearing      Mobility Bed Mobility Overal bed mobility: Needs Assistance   Rolling: Supervision (heavy use of rails)         General bed mobility comments: "I'm just too tired"  Transfers                 General transfer comment: declined this pm    Balance                                           ADL either performed or assessed with clinical judgement   ADL Overall ADL's : Needs assistance/impaired     Grooming: Set up;Sitting   Upper Body Bathing: Set up;Sitting;Bed level   Lower Body Bathing: Moderate assistance;Bed level    Upper Body Dressing : Minimal assistance;Bed level   Lower Body Dressing: Moderate assistance;Bed level       Toileting- Clothing Manipulation and Hygiene: Moderate assistance;Bed level       Functional mobility during ADLs:  (declined OOB) General ADL Comments: limited today by fatigue/low Hgb     Vision Baseline Vision/History: Wears glasses       Perception     Praxis      Pertinent Vitals/Pain Pain Assessment: 0-10 Pain Score: 2  Pain Location: R lower leg Pain Descriptors / Indicators: Operative site guarding;Sore Pain Intervention(s): Limited activity within patient's tolerance;Repositioned     Hand Dominance Right   Extremity/Trunk Assessment Upper Extremity Assessment Upper Extremity Assessment: RUE deficits/detail;LUE deficits/detail RUE Deficits / Details: generalized weakness; significant wasting of intrinsics; drops items occasionally RUE Sensation: decreased light touch RUE Coordination: decreased fine motor LUE Deficits / Details: generalized weakness   Lower Extremity Assessment Lower Extremity Assessment: Defer to PT evaluation RLE Deficits / Details: Limited knee extension AROM       Communication Communication Communication: No difficulties   Cognition Arousal/Alertness: Awake/alert Behavior During Therapy: WFL for tasks assessed/performed Overall Cognitive Status: Impaired/Different from baseline Area of Impairment: Attention;Memory                   Current Attention Level: Selective Memory: Decreased short-term memory  General Comments: Pt reports difficulty remembering events adn staes "I was out of it". will further assess   General Comments       Exercises Exercises: Other exercises Other Exercises Other Exercises: terminal extension R knee; positioning with knee flexed Other Exercises: encouraged to roll q 2 hrs to relieve pressure from buttocks   Shoulder Instructions      Home Living Family/patient  expects to be discharged to:: Private residence Living Arrangements: Spouse/significant other Available Help at Discharge: Family;Available PRN/intermittently Type of Home: House Home Access: Ramped entrance     Home Layout: Two level;Able to live on main level with bedroom/bathroom Alternate Level Stairs-Number of Steps: 1 step down into the living room Alternate Level Stairs-Rails: None Bathroom Shower/Tub: Occupational psychologist: Standard Bathroom Accessibility: Yes (toilet is not accessible) How Accessible: Accessible via wheelchair;Accessible via walker Home Equipment: [place] - single point;Shower seat;Wheelchair - manual;Crutches;Walker - 2 wheels;Hand held shower head;Grab bars - toilet;Shower seat - built in          Prior Functioning/Environment Level of Independence: Independent with assistive device(s)        Comments: Uses RW more recently and SPC prior to a few weeks ago.        OT Problem List: Decreased strength;Decreased range of motion;Decreased activity tolerance;Impaired balance (sitting and/or standing);Decreased coordination;Decreased safety awareness;Decreased cognition;Decreased knowledge of use of DME or AE;Cardiopulmonary status limiting activity;Pain      OT Treatment/Interventions: Self-care/ADL training;Therapeutic exercise;Neuromuscular education;Energy conservation;DME and/or AE instruction;Therapeutic activities;Cognitive remediation/compensation;Patient/family education;Balance training    OT Goals(Current goals can be found in the care plan section) Acute Rehab OT Goals Patient Stated Goal: return to being independent OT Goal Formulation: With patient/family Time For Goal Achievement: 01/28/20 Potential to Achieve Goals: Good  OT Frequency: Min 2X/week   Barriers to D/C:            Co-evaluation              AM-PAC OT "6 Clicks" Daily Activity     Outcome Measure Help from another person eating meals?: None Help from  another person taking care of personal grooming?: A Little Help from another person toileting, which includes using toliet, bedpan, or urinal?: A Lot Help from another person bathing (including washing, rinsing, drying)?: A Lot Help from another person to put on and taking off regular upper body clothing?: A Little Help from another person to put on and taking off regular lower body clothing?: A Lot 6 Click Score: 16   End of Session Nurse Communication: Mobility status;Other (comment) (positioning; need for incentive spirometer)  Activity Tolerance: Patient tolerated treatment well Patient left: in bed;with call bell/phone within reach;with restraints reapplied;with bed alarm set  OT Visit Diagnosis: Unsteadiness on feet (R26.81);Other abnormalities of gait and mobility (R26.89);Muscle weakness (generalized) (M62.81);Other symptoms and signs involving cognitive function;Pain Pain - Right/Left: Right Pain - part of body: Leg                Time: 1340-1405 OT Time Calculation (min): 25 min Charges:  OT General Charges $OT Visit: 1 Visit OT Treatments $Therapeutic Activity: 8-22 mins  Maurie Boettcher, OT/L   Acute OT Clinical Specialist [place] Pager [phone] Office [phone]   [facility] 01/14/2020, 2:31 PM

## 2020-01-14 NOTE — Progress Notes (Signed)
Occupational therapy Treatment Note  Began education on theraband and theraputty exercise to work on over the weekend. Recommend rehab at [place].   01/14/20 1600  OT Visit Information  Last OT Received On 01/14/20  Assistance Needed +2  History of Present Illness Patient is a 64 y/o male who presents with sepsis secondary to RLE osteomyelitis and abscess at [place] stump site now s/p right BKA revision 8/25. PMH includes renal transplant, CKD stage IV, calciphylaxis, anemia, A-fib, duodenal adenocarcinoma s/p resection, DM, diabetic neuropathy, s/p bil BKAs, HTN.  Precautions  Precautions Fall  Precaution Comments bil BKAs; wound vac; no pillow under R knee  Pain Assessment  Pain Assessment 0-10  Pain Score 2  Pain Location R lower leg  Pain Descriptors / Indicators Operative site guarding;Sore  Pain Intervention(s) Limited activity within patient's tolerance  Cognition  Arousal/Alertness Awake/alert  Behavior During Therapy WFL for tasks assessed/performed  Overall Cognitive Status Impaired/Different from baseline  Exercises  Exercises General Upper Extremity  General Exercises - Upper Extremity  Shoulder Flexion Strengthening;Both;10 reps;Theraband  Shoulder ABduction Strengthening;Both;10 reps;Theraband  Elbow Flexion Strengthening;Both;10 reps  Elbow Extension Strengthening;Both;10 reps;Supine  Theraband Level (Shoulder Flexion) Level 1 (Yellow)  Theraband Level (Shoulder Abduction) Level 1 (Yellow)  Other Exercises  Other Exercises Theraputty exercises given - Yello/soft to work on intrinsic strengthening  OT - End of Session  Activity Tolerance Patient tolerated treatment well  Patient left in bed;with call bell/phone within reach;with family/visitor present  Nurse Communication Other (comment) (encourage ex; recommend incentive spriometer)  OT Assessment/Plan  OT Plan Discharge plan remains appropriate  OT Visit Diagnosis Unsteadiness on feet (R26.81);Other abnormalities of  gait and mobility (R26.89);Muscle weakness (generalized) (M62.81);Other symptoms and signs involving cognitive function;Pain  Pain - Right/Left Right  Pain - part of body Leg  OT Frequency (ACUTE ONLY) Min 2X/week  Recommendations for Other Services Rehab consult  Follow Up Recommendations CIR;Supervision/Assistance - 24 hour  OT Equipment None recommended by OT  AM-PAC OT "6 Clicks" Daily Activity Outcome Measure (Version 2)  Help from another person eating meals? 4  Help from another person taking care of personal grooming? 3  Help from another person toileting, which includes using toliet, bedpan, or urinal? 2  Help from another person bathing (including washing, rinsing, drying)? 2  Help from another person to put on and taking off regular upper body clothing? 3  Help from another person to put on and taking off regular lower body clothing? 2  6 Click Score 16  OT Goal Progression  Progress towards OT goals Progressing toward goals  Acute Rehab OT Goals  Patient Stated Goal return to being independent  OT Goal Formulation With patient/family  Time For Goal Achievement 01/28/20  Potential to Achieve Goals Good  ADL Goals  Pt Will Perform Lower Body Bathing with set-up;sitting/lateral leans;with supervision  Pt Will Perform Lower Body Dressing with set-up;with supervision;sitting/lateral leans  Pt Will Transfer to Toilet with supervision;bedside commode  Pt Will Perform Toileting - Clothing Manipulation and hygiene with modified independence;sitting/lateral leans  Pt/caregiver will Perform Home Exercise Program Increased strength;With theraband;With theraputty;Independently;With written HEP provided;Both right and left upper extremity  OT Time Calculation  OT Start Time (ACUTE ONLY) 1544  OT Stop Time (ACUTE ONLY) 1555  OT Time Calculation (min) 11 min  OT General Charges  $OT Visit 1 Visit  OT Treatments  $Therapeutic Exercise 8-22 mins  Maurie Boettcher, OT/L   Acute OT Clinical  Specialist [place] Pager [phone] Office [phone]

## 2020-01-14 NOTE — Progress Notes (Signed)
   01/14/20 0021  Hand-Off documentation  Handoff Given Given to shift RN/LPN  Report given to (Full Name) Lyda Kalata, RN  Handoff Received Received from shift RN/LPN  Report received from (Full Name) Ebony Rickel, RN  Vital Signs  Temp 98.2 F (36.8 C)  Temp Source Oral  Pulse Rate 66  Pulse Rate Source Monitor  Resp 13  BP (!) 110/53  BP Location Left Arm  BP Method Automatic  Patient Position (if appropriate) Lying  Oxygen Therapy  SpO2 99 %  O2 Device Room Air  Pain Assessment  Pain Scale 0-10  Pain Score 0  Post-Hemodialysis Assessment  Rinseback Volume (mL) 250 mL  KECN 301 V  Dialyzer Clearance Lightly streaked  Hemodialysis Intake (mL) 500 mL  UF Total -Machine (mL) 2021 mL  Net UF (mL) 1521 mL  Tolerated HD Treatment Yes  Post-Hemodialysis Comments tx achieved as expected, well tolerated, no complaints/concerns  AVG/AVF Arterial Site Held (minutes) 10 minutes  AVG/AVF Venous Site Held (minutes) 10 minutes  Education / Care Plan  Dialysis Education Provided Yes  Fistula / Graft Right Forearm Arteriovenous fistula  Placement Date/Time: 05/22/12 0831   Placed prior to admission: No  Orientation: Right  Access Location: Forearm  Access Type: Arteriovenous fistula  Expiration Date: 03/21/15  Site Condition No complications  Fistula / Graft Assessment Present;Thrill;Bruit  Status Deaccessed  Drainage Description None

## 2020-01-14 NOTE — Evaluation (Signed)
Physical Therapy Evaluation Patient Details Name: Paul [name] MRN: [id number] DOB: October 21, 1955 Today's Date: 01/14/2020   History of Present Illness  Patient is a 64 y/o male who presents with sepsis secondary to RLE osteomyelitis and abscess at [place] stump site now s/p right BKA revision 8/25. PMH includes renal transplant, CKD stage IV, calciphylaxis, anemia, A-fib, duodenal adenocarcinoma s/p resection, DM, diabetic neuropathy, s/p bil BKAs, HTN.  Clinical Impression  Patient presents with pain, generalized weakness, decreased activity tolerance, impaired balance and impaired mobility s/p above. Pt lives at home with wife and reports using RW and doing ADLs Mod I PTA. Reports having increased difficulty caring for self recently taking increased time. Pt has bil prostheses at home. Today, pt requires Max A for bed mobility and Max A progressing to moments of Min guard assist for sitting balance. Worked on reaching outside [employer] and sitting balance with pt able to self correct as needed, fatigues quickly. Wife to bring prosthesis and sliding board from home to work on transfers. Would benefit from CIR to maximize independence and mobility prior to return home. Will follow acutely.     Follow Up Recommendations CIR;Supervision for mobility/OOB;Supervision/Assistance - 24 hour    Equipment Recommendations  None recommended by PT (has all DME)    Recommendations for Other Services Rehab consult     Precautions / Restrictions Precautions Precautions: Fall Precaution Comments: bil BKAs Restrictions Weight Bearing Restrictions: No      Mobility  Bed Mobility Overal bed mobility: Needs Assistance Bed Mobility: Rolling;Sidelying to Sit;Sit to Sidelying Rolling: Min assist Sidelying to sit: Max assist;HOB elevated     Sit to sidelying: Mod assist General bed mobility comments: Step by step cues for sequencing, assist with rolling, scooting bottom and elevating trunk. Assist to bring LEs into  bed to return to supine. USe of rail.  Transfers Overall transfer level: Needs assistance   Transfers: Lateral/Scoot Transfers          Lateral/Scoot Transfers: Max assist General transfer comment: Assist to laterally scoot along side of bed with Max A, difficulty scooting and lifting bottom with posterior lean.  Ambulation/Gait                Stairs            Wheelchair Mobility    Modified Rankin (Stroke Patients Only)       Balance Overall balance assessment: Needs assistance Sitting-balance support: Feet unsupported;Bilateral upper extremity supported Sitting balance-Leahy Scale: Poor Sitting balance - Comments: Initially requiring Max A with BUE support progressing to 1 UE support progressing to no UE support. Max A progressing to close Min guard. Worked on reaching outside [employer] in all directions with [place]. Postural control: Posterior lean                                   Pertinent Vitals/Pain Pain Assessment: 0-10 Pain Score: 3  Pain Location: surgery site Pain Descriptors / Indicators: Operative site guarding;Sore Pain Intervention(s): Monitored during session;Repositioned    Home Living Family/patient expects to be discharged to:: Private residence Living Arrangements: Spouse/significant other Available Help at Discharge: Family;Available PRN/intermittently Type of Home: House Home Access: Ramped entrance     Home Layout: Two level;Able to live on main level with bedroom/bathroom Home Equipment: [place] - single point;Shower seat;Wheelchair - manual;Crutches;Walker - 2 wheels;Hand held shower head;Grab bars - toilet;Shower seat - built in      Prior  Function Level of Independence: Independent with assistive device(s)         Comments: Uses RW more recently and SPC prior to a few weeks ago.     Hand Dominance   Dominant Hand: Right    Extremity/Trunk Assessment   Upper Extremity Assessment Upper Extremity Assessment:  Defer to OT evaluation    Lower Extremity Assessment Lower Extremity Assessment: Generalized weakness;RLE deficits/detail RLE Deficits / Details: Limited knee extension AROM    Cervical / Trunk Assessment Cervical / Trunk Assessment: Other exceptions Cervical / Trunk Exceptions: generalized core/trunk weakness  Communication   Communication: No difficulties  Cognition Arousal/Alertness: Awake/alert Behavior During Therapy: WFL for tasks assessed/performed Overall Cognitive Status: No family/caregiver present to determine baseline cognitive functioning                                 General Comments: Reports hx of memory issues, using good techniques of repetition with therapist multiple times during session.      General Comments      Exercises     Assessment/Plan    PT Assessment Patient needs continued PT services  PT Problem List Decreased strength;Decreased mobility;Pain;Decreased balance;Decreased activity tolerance;Decreased cognition;Decreased skin integrity;Decreased range of motion       PT Treatment Interventions Therapeutic activities;Therapeutic exercise;Patient/family education;Balance training;Wheelchair mobility training;Neuromuscular re-education;Functional mobility training;DME instruction    PT Goals (Current goals can be found in the Care Plan section)  Acute Rehab PT Goals Patient Stated Goal: to get stronger and go home PT Goal Formulation: With patient Time For Goal Achievement: 01/28/20 Potential to Achieve Goals: Fair    Frequency Min 3X/week   Barriers to discharge Decreased caregiver support      Co-evaluation               AM-PAC PT "6 Clicks" Mobility  Outcome Measure Help needed turning from your back to your side while in a flat bed without using bedrails?: A Lot Help needed moving from lying on your back to sitting on the side of a flat bed without using bedrails?: Total Help needed moving to and from a bed to  a chair (including a wheelchair)?: Total Help needed standing up from a chair using your arms (e.g., wheelchair or bedside chair)?: Total Help needed to walk in hospital room?: Total Help needed climbing 3-5 steps with a railing? : Total 6 Click Score: 7    End of Session   Activity Tolerance: Patient limited by fatigue Patient left: in bed;with call bell/phone within reach;with nursing/sitter in room Nurse Communication: Mobility status;Need for lift equipment PT Visit Diagnosis: Pain Pain - Right/Left: Right Pain - part of body: Leg    Time: 5053-9767 PT Time Calculation (min) (ACUTE ONLY): 27 min   Charges:   PT Evaluation $PT Eval Moderate Complexity: 1 Mod PT Treatments $Therapeutic Activity: 8-22 mins        Marisa Severin, PT, DPT Acute Rehabilitation Services Pager [phone] Office [phone]      Marguarite Arbour A Sabra Heck 01/14/2020, 10:25 AM

## 2020-01-14 NOTE — Progress Notes (Signed)
PROGRESS NOTE    Paul [name]  JJK:[id number] DOB: [birth year] DOA: 12/30/2019 PCP: [name], MD    Brief Narrative: HPI on 12/31/2019 by Dr. Ysidro Evert Bairdis a 64 y.o.malewith medical history significant ofkidney transplant recipient(2016)with CKD stage IV,hypertension, IDDM,chronic A. fib on Xarelto, B/L BKAsecondary to PVD, presented with new onset of rightBKA stump pain and rash.Symptoms started about 1 week ago, patient attributed that to a unfit prosthetic which might caused a skin abrasions.He started to notice significant pain rash associated the end of right leg stump about 3 days ago. Pain has been constant and gradually getting worse, denies any fever chills.He took some Tylenol for the pain without any relief.  Patient has been following with [facility] for the kidney transplant, he was seen in the office about 2 weeks ago and multiple blood work and urine analysis sent,urine protein/Cre>5,000,patient however was not informed about results yet. And patient claims his kidney function has been stable,denies any back pain, decreased urine output or urine color.   Assessment & Plan:   Principal Problem:   Sepsis ([place]) Active Problems:   ESRD on hemodialysis ([place])   Anemia   DM (diabetes mellitus) ([place])   S/P BKA (below knee amputation) bilateral (HCC)   AF (paroxysmal atrial fibrillation) (HCC)   AKI (acute kidney injury) ([place])   CKD stage 4 due to type 2 diabetes mellitus ([place])   Cellulitis   Amputation stump infection ([place])   Subacute osteomyelitis of right tibia (HCC)   Dehiscence of amputation stump ([place])   #1 sepsis present on admission secondary to right stump osteomyelitis and abscess status post revision 01/12/2020. On vancomycin and cefepime.  #2 A. fib with RVR patient has chronic atrial fibrillation.  Seen by cardiology started on amiodarone.  Eliquis on hold for the surgery.  He was on heparin drip.  Heparin drip  has been stopped since he started bleeding from the right stump.  Ortho recommending to hold heparin for 3 days, hopefully if no further bleed will restart heparin on 01/16/2020.  #3 acute metabolic encephalopathy seems to have resolved patient is awake alert oriented x3.  #4 AKI on CKD stage IV with severe metabolic acidosis patient with history of kidney transplant. On tacrolimus prednisone and mycophenolate Followed by nephrology For dialysis 01/13/2020.  He was started on dialysis during this hospital admission.  #5 history of essential hypertension on amlodipine.  Clonidine held  #6 type 2 diabetes with hyperglycemia uncontrolled hemoglobin A1c 10.3 patient is on chronic prednisone. CBG (last 3)  Recent Labs    01/14/20 0639 01/14/20 0933 01/14/20 1056  GLUCAP 179* 231* 185*    Continue SSI and lantus  #7 history of pancreatic insufficiency on Creon status post resection of the duodenal adenocarcinoma  #8 hyperlipidemia on statin  #9  Abla/anemia of chronic disease hemoglobin 6.3 received 1 unit of packed RBC hemoglobin up to 7.0.  Please recheck levels in a.m.    Estimated body mass index is 21.17 kg/m as calculated from the following:   Height as of this encounter: 6' (1.829 m).   Weight as of this encounter: 70.8 kg.  DVT prophylaxis: SCD Heparin stopped due to right stump bleeding  code Status: Full code Family Communication: None at bedside Disposition Plan:  Status is: Inpatient  Dispo:  Patient From: Home  Planned Disposition: Home with Health Care Svc  Expected discharge date: tbd  Medically stable for discharge: No    Consultants: Ortho, cardiology, nephrology Procedures: None  Antimicrobials: Vanco and cefepime  Subjective: He is resting in bed awake and alert he is able to tolerate breakfast this morning.  No nausea vomiting reported no further bleeding overnight Heparin has been stopped.  Objective: Vitals:   01/14/20 0400 01/14/20 0718 01/14/20  0928 01/14/20 1053  BP: (!) 129/57 (!) 125/54 109/62 (!) 134/54  Pulse: 65 71  63  Resp: 13 16    Temp: 98.7 F (37.1 C) 98.2 F (36.8 C)  98.2 F (36.8 C)  TempSrc: Oral Oral  Oral  SpO2: 98% 97%  99%  Weight:      Height:        Intake/Output Summary (Last 24 hours) at 01/14/2020 1232 Last data filed at 01/14/2020 0021 Gross per 24 hour  Intake --  Output 1521 ml  Net -1521 ml   Filed Weights   01/08/20 0840 01/08/20 1215 01/11/20 1441  Weight: 66.7 kg 65.9 kg 70.8 kg    Examination:  General exam: Appears calm and comfortable  Respiratory system: Clear to auscultation. Respiratory effort normal. Cardiovascular system: S1 & S2 heard, RRR. No JVD, murmurs, rubs, gallops or clicks. No pedal edema. Gastrointestinal system: Abdomen is nondistended, soft and nontender. No organomegaly or masses felt. Normal bowel sounds heard. Central nervous system: Alert and oriented. No focal neurological deficits. Extremities: Bilateral BKA right stump covered with dressing  skin: No rashes, lesions or ulcers Psychiatry: Judgement and insight appear normal. Mood & affect appropriate.     Data Reviewed: I have personally reviewed following labs and imaging studies  CBC: Recent Labs  Lab 01/11/20 0108 01/11/20 0108 01/12/20 0712 01/12/20 2026 01/13/20 0042 01/13/20 0852 01/14/20 1115  WBC 9.4  --  11.4*  --  16.3* 22.6* 9.7  HGB 7.8*   < > 8.1* 7.3* 6.3* 7.0* 5.9*  HCT 25.5*   < > 26.2* 24.3* 21.7* 22.6* 19.6*  MCV 93.8  --  93.2  --  96.0 94.6 94.7  PLT 106*  --  127*  --  142* 141* 116*   < > = values in this interval not displayed.   Basic Metabolic Panel: Recent Labs  Lab 01/08/20 0534 01/08/20 0534 01/09/20 0123 01/10/20 0059 01/11/20 0854 01/12/20 0712 01/14/20 1115  NA 136   < > 134* 133* 132* 137 137  K 3.6   < > 4.4 5.1 4.6 4.0 4.2  CL 101   < > 101 102 103 103 103  CO2 20*   < > 27 22 19* 25 27  GLUCOSE 230*   < > 311* 406* 293* 195* 195*  BUN 39*   < >  21 35* 54* 25* 17  CREATININE 3.59*   < > 2.29* 3.37* 4.17* 2.48* 2.31*  CALCIUM 8.3*   < > 7.9* 8.0* 8.3* 8.1* 7.8*  MG  --   --  2.0  --   --   --   --   PHOS 3.7  --   --  1.9* 1.8* 1.6* 2.7   < > = values in this interval not displayed.   GFR: Estimated Creatinine Clearance: 31.5 mL/min (A) (by C-G formula based on SCr of 2.31 mg/dL (H)). Liver Function Tests: Recent Labs  Lab 01/08/20 0534 01/10/20 0059 01/11/20 0854 01/12/20 0712 01/14/20 1115  ALBUMIN 1.7* 1.6* 1.6* 1.6* 1.8*   No results for input(s): LIPASE, AMYLASE in the last 168 hours. No results for input(s): AMMONIA in the last 168 hours. Coagulation Profile: No results for input(s): INR, PROTIME in the  last 168 hours. Cardiac Enzymes: No results for input(s): CKTOTAL, CKMB, CKMBINDEX, TROPONINI in the last 168 hours. BNP (last 3 results) No results for input(s): PROBNP in the last 8760 hours. HbA1C: No results for input(s): HGBA1C in the last 72 hours. CBG: Recent Labs  Lab 01/13/20 1205 01/13/20 1553 01/14/20 0639 01/14/20 0933 01/14/20 1056  GLUCAP 141* 93 179* 231* 185*   Lipid Profile: No results for input(s): CHOL, HDL, LDLCALC, TRIG, CHOLHDL, LDLDIRECT in the last 72 hours. Thyroid Function Tests: No results for input(s): TSH, T4TOTAL, FREET4, T3FREE, THYROIDAB in the last 72 hours. Anemia Panel: No results for input(s): VITAMINB12, FOLATE, FERRITIN, TIBC, IRON, RETICCTPCT in the last 72 hours. Sepsis Labs: No results for input(s): PROCALCITON, LATICACIDVEN in the last 168 hours.  Recent Results (from the past 240 hour(s))  Surgical PCR screen     Status: Abnormal   Collection Time: 01/11/20 10:38 PM   Specimen: Nasal Mucosa; Nasal Swab  Result Value Ref Range Status   MRSA, PCR NEGATIVE NEGATIVE Final   Staphylococcus aureus POSITIVE (A) NEGATIVE Final    Comment: (NOTE) The Xpert SA Assay (FDA approved for NASAL specimens in patients 89 years of age and older), is one component of a  comprehensive surveillance program. It is not intended to diagnose infection nor to guide or monitor treatment. Performed at [facility], [place] [address] [place],  [address]          Radiology Studies: No results found.      Scheduled Meds: . amiodarone  200 mg Oral Daily  . amLODipine  5 mg Oral Daily  . aspirin EC  81 mg Oral Daily  . atorvastatin  10 mg Oral Q M,W,F  . Chlorhexidine Gluconate Cloth  6 each Topical Q0600  . Chlorhexidine Gluconate Cloth  6 each Topical Q0600  . Chlorhexidine Gluconate Cloth  6 each Topical Q0600  . Chlorhexidine Gluconate Cloth  6 each Topical Q0600  . cholecalciferol  4,000 Units Oral Daily  . darbepoetin (ARANESP) injection - DIALYSIS  60 mcg Intravenous Q Thu-HD  . docusate sodium  100 mg Oral BID  . insulin aspart  0-9 Units Subcutaneous TID WC  . insulin aspart  2 Units Subcutaneous TID WC  . insulin glargine  6 Units Subcutaneous Daily  . lipase/protease/amylase  12,000 Units Oral TID AC  . magnesium chloride  2 tablet Oral BID  . mupirocin ointment  1 application Nasal BID  . mycophenolate  180 mg Oral BID  . pantoprazole  40 mg Oral Daily  . predniSONE  5 mg Oral Q breakfast  . sertraline  50 mg Oral Daily  . tacrolimus  2 mg Oral BID  . tamsulosin  0.4 mg Oral QPC supper   Continuous Infusions: . sodium chloride 50 mL/hr at 01/12/20 2338     LOS: 14 days     Georgette Shell, MD  01/14/2020, 12:32 PM

## 2020-01-14 NOTE — Progress Notes (Signed)
[facility] NEPHROLOGY PROGRESS NOTE  Assessment/ Plan: Pt is a 64 y.o. yo male with DDRT 2015 CKD4, AoCKD Afib DM HTN b/l BKA h/o duodenal adenocarcinoma s/p resection.Allograft biopsy which showed interstitial fibrosis andCIN.Currently on Myfortic, Tac and prednisone. Baseline Cr is2.8-3.4(as recently as 10/12/2019) but appears to be slowly worsening as an outpatient.  Admitted with right BKA site infection and sepsis, renal failure  # Acute kidney injury on CKD4, now progressed to ESRD: BL cr 2.8-3.4 s/p DDRT with DGF complicated by ACR and antibody mediated rejection requiring IVIG as recently as 2018. Started HD in the hospital.  No sign of renal recovery therefore we will continue TTS schedule for dialysis.  He has outpatient dialysis set up for TTS at [facility]. Patient has functional dialysis access. He had dialysis yesterday with 1.5 L UF, tolerated well.  Plan for next HD tomorrow.  #Kidney transplant: Continue tacrolimus, mycophenolate, prednisone-tac tapered to 2/2 on 8/18 -> mycophenolate tapered to 180 mg twice daily on 8/22.  Pharmacy ordering tacrolimus level since patient is on amiodarone.  #Hypertension/volume: Continue amlodipine.  Managing volume with dialysis.  #Secondary hyperparathyroidism/hyperphosphatemia: PTH level 43.  Phosphorus level was low off binders, monitor lab.  #Anemia of CKD, acute illness: Drop in hemoglobin due to bleeding at the surgical site.  Lost around 500 cc of blood.  Received a unit of blood transfusion today.  Continue Aranesp.  Iron saturation 81 and currently on antibiotics.  Hemoglobin improved to 7 today.  #Severe sepsis due to right leg osteomyelitis/abscess on BKA stump site: Currently on vancomycin.  Status post revision of right below-knee amputation by Dr. Sharol Given on 8/25.  May go to rehab.  Subjective: Seen and examined at bedside.  No new event, finished dialysis late last night.  Denies nausea vomiting chest pain  shortness of breath.    Objective Vital signs in last 24 hours: Vitals:   01/14/20 0100 01/14/20 0400 01/14/20 0718 01/14/20 0928  BP: (!) 114/56 (!) 129/57 (!) 125/54 109/62  Pulse: 68 65 71   Resp: 15 13 16    Temp: 99.3 F (37.4 C) 98.7 F (37.1 C) 98.2 F (36.8 C)   TempSrc: Oral Oral Oral   SpO2: 98% 98% 97%   Weight:      Height:       Weight change:   Intake/Output Summary (Last 24 hours) at 01/14/2020 0948 Last data filed at 01/14/2020 0021 Gross per 24 hour  Intake --  Output 1521 ml  Net -1521 ml       Labs: Basic Metabolic Panel: Recent Labs  Lab 01/10/20 0059 01/11/20 0854 01/12/20 0712  NA 133* 132* 137  K 5.1 4.6 4.0  CL 102 103 103  CO2 22 19* 25  GLUCOSE 406* 293* 195*  BUN 35* 54* 25*  CREATININE 3.37* 4.17* 2.48*  CALCIUM 8.0* 8.3* 8.1*  PHOS 1.9* 1.8* 1.6*   Liver Function Tests: Recent Labs  Lab 01/10/20 0059 01/11/20 0854 01/12/20 0712  ALBUMIN 1.6* 1.6* 1.6*   No results for input(s): LIPASE, AMYLASE in the last 168 hours. No results for input(s): AMMONIA in the last 168 hours. CBC: Recent Labs  Lab 01/08/20 0534 01/09/20 0123 01/11/20 0108 01/11/20 0108 01/12/20 0712 01/12/20 0712 01/12/20 2026 01/13/20 0042 01/13/20 0852  WBC 10.0  --  9.4   < > 11.4*  --   --  16.3* 22.6*  HGB 8.1*   < > 7.8*   < > 8.1*   < > 7.3* 6.3* 7.0*  HCT 26.1*   < > 25.5*   < > 26.2*   < > 24.3* 21.7* 22.6*  MCV 91.9  --  93.8  --  93.2  --   --  96.0 94.6  PLT 125*  --  106*   < > 127*  --   --  142* 141*   < > = values in this interval not displayed.   Cardiac Enzymes: No results for input(s): CKTOTAL, CKMB, CKMBINDEX, TROPONINI in the last 168 hours. CBG: Recent Labs  Lab 01/13/20 0616 01/13/20 1205 01/13/20 1553 01/14/20 0639 01/14/20 0933  GLUCAP 252* 141* 93 179* 231*    Iron Studies: No results for input(s): IRON, TIBC, TRANSFERRIN, FERRITIN in the last 72 hours. Studies/Results: No results  found.  Medications: Infusions: . sodium chloride 50 mL/hr at 01/12/20 2338    Scheduled Medications: . amiodarone  200 mg Oral Daily  . amLODipine  5 mg Oral Daily  . aspirin EC  81 mg Oral Daily  . atorvastatin  10 mg Oral Q M,W,F  . Chlorhexidine Gluconate Cloth  6 each Topical Q0600  . Chlorhexidine Gluconate Cloth  6 each Topical Q0600  . Chlorhexidine Gluconate Cloth  6 each Topical Q0600  . cholecalciferol  4,000 Units Oral Daily  . darbepoetin (ARANESP) injection - DIALYSIS  60 mcg Intravenous Q Thu-HD  . docusate sodium  100 mg Oral BID  . insulin aspart  0-9 Units Subcutaneous TID WC  . insulin aspart  2 Units Subcutaneous TID WC  . insulin glargine  6 Units Subcutaneous Daily  . lipase/protease/amylase  12,000 Units Oral TID AC  . magnesium chloride  2 tablet Oral BID  . mupirocin ointment  1 application Nasal BID  . mycophenolate  180 mg Oral BID  . pantoprazole  40 mg Oral Daily  . predniSONE  5 mg Oral Q breakfast  . sertraline  50 mg Oral Daily  . tacrolimus  2 mg Oral BID  . tamsulosin  0.4 mg Oral QPC supper    have reviewed scheduled and prn medications.  Physical Exam: General: Sitting on bed comfortable, not in distress Heart:RRR, s1s2 nl Lungs: Clear b/l, no crackle Abdomen:soft, Non-tender, non-distended Extremities: Bilateral BKA, right BKA site has bandage applied. Dialysis Access: Right AV fistula has good thrill and bruit.  Zandrea Kenealy Prasad Amarianna Abplanalp 01/14/2020,9:48 AM  LOS: 14 days  Pager: [phone]

## 2020-01-15 LAB — TYPE AND SCREEN
ABO/RH(D): A NEG
Antibody Screen: NEGATIVE
Unit division: 0
Unit division: 0
Unit division: 0

## 2020-01-15 LAB — BPAM RBC
Blood Product Expiration Date: 202109172359
Blood Product Expiration Date: 202109182359
Blood Product Expiration Date: 202109192359
ISSUE DATE / TIME: 202108260326
ISSUE DATE / TIME: 202108271635
ISSUE DATE / TIME: 202108272019
Unit Type and Rh: 600
Unit Type and Rh: 600
Unit Type and Rh: 600

## 2020-01-15 LAB — GLUCOSE, CAPILLARY
Glucose-Capillary: 236 mg/dL — ABNORMAL HIGH (ref 70–99)
Glucose-Capillary: 294 mg/dL — ABNORMAL HIGH (ref 70–99)
Glucose-Capillary: 227 mg/dL — ABNORMAL HIGH (ref 70–99)

## 2020-01-15 LAB — RENAL FUNCTION PANEL
Albumin: 1.7 g/dL — ABNORMAL LOW (ref 3.5–5.0)
Anion gap: 7 (ref 5–15)
BUN: 36 mg/dL — ABNORMAL HIGH (ref 8–23)
CO2: 25 mmol/L (ref 22–32)
Calcium: 7.9 mg/dL — ABNORMAL LOW (ref 8.9–10.3)
Chloride: 104 mmol/L (ref 98–111)
Creatinine, Ser: 3.59 mg/dL — ABNORMAL HIGH (ref 0.61–1.24)
GFR calc Af Amer: 19 mL/min — ABNORMAL LOW (ref >60)
GFR calc non Af Amer: 17 mL/min — ABNORMAL LOW (ref >60)
Glucose, Bld: 209 mg/dL — ABNORMAL HIGH (ref 70–99)
Phosphorus: 3.7 mg/dL (ref 2.5–4.6)
Potassium: 4.8 mmol/L (ref 3.5–5.1)
Sodium: 136 mmol/L (ref 135–145)

## 2020-01-15 LAB — CBC
HCT: 25.9 % — ABNORMAL LOW (ref 39.0–52.0)
Hemoglobin: 8.3 g/dL — ABNORMAL LOW (ref 13.0–17.0)
MCH: 29.3 pg (ref 26.0–34.0)
MCHC: 32 g/dL (ref 30.0–36.0)
MCV: 91.5 fL (ref 80.0–100.0)
Platelets: 124 10*3/uL — ABNORMAL LOW (ref 150–400)
RBC: 2.83 MIL/uL — ABNORMAL LOW (ref 4.22–5.81)
RDW: 16.6 % — ABNORMAL HIGH (ref 11.5–15.5)
WBC: 9.7 10*3/uL (ref 4.0–10.5)
nRBC: 0 % (ref 0.0–0.2)

## 2020-01-15 MED ORDER — POLYETHYLENE GLYCOL 3350 17 G PO PACK
17.0000 g | PACK | Freq: Every day | ORAL | Status: DC
  Administered 2020-01-15 – 2020-01-18 (×4): 17 g via ORAL
  Filled 2020-01-15 (×4): qty 1

## 2020-01-15 NOTE — Progress Notes (Addendum)
PROGRESS NOTE    Paul [name]  DVV:[id number] DOB: April 25, 1956 DOA: 12/30/2019 PCP: [name], MD    Brief Narrative: HPI on 12/31/2019 by Dr. Ysidro Evert Bairdis a 65 y.o.malewith medical history significant ofkidney transplant recipient(2016)with CKD stage IV,hypertension, IDDM,chronic A. fib on Xarelto, B/L BKAsecondary to PVD, presented with new onset of rightBKA stump pain and rash.Symptoms started about 1 week ago, patient attributed that to a unfit prosthetic which might caused a skin abrasions.He started to notice significant pain rash associated the end of right leg stump about 3 days ago. Pain has been constant and gradually getting worse, denies any fever chills.He took some Tylenol for the pain without any relief.  Patient has been following with [facility] for the kidney transplant, he was seen in the office about 2 weeks ago and multiple blood work and urine analysis sent,urine protein/Cre>5,000,patient however was not informed about results yet. And patient claims his kidney function has been stable,denies any back pain, decreased urine output or urine color.   Assessment & Plan:   Principal Problem:   Sepsis ([place]) Active Problems:   ESRD on hemodialysis ([place])   Anemia   DM (diabetes mellitus) ([place])   S/P BKA (below knee amputation) bilateral (HCC)   AF (paroxysmal atrial fibrillation) (HCC)   AKI (acute kidney injury) ([place])   CKD stage 4 due to type 2 diabetes mellitus ([place])   Cellulitis   Amputation stump infection ([place])   Subacute osteomyelitis of right tibia (HCC)   Dehiscence of amputation stump ([place])   #1 sepsis present on admission secondary to right stump osteomyelitis and abscess status post revision 01/12/2020. On vancomycin and cefepime.  #2 A. fib with RVR patient has chronic atrial fibrillation.  Seen by cardiology started on amiodarone.  Eliquis on hold for the surgery.  He was on heparin drip.  Heparin drip  has been stopped since he started bleeding from the right stump.  Ortho recommending to hold heparin for 3 days, hopefully if no further bleed will restart heparin on 01/16/2020.  #3 acute metabolic encephalopathy seems to have resolved patient is awake alert oriented x3.  #4 AKI on CKD stage IV with severe metabolic acidosis patient with history of kidney transplant. On tacrolimus prednisone and mycophenolate Followed by nephrology For dialysis 01/13/2020.  He was started on dialysis during this hospital admission.  #5 history of essential hypertension on amlodipine.  Clonidine held  #6 type 2 diabetes with hyperglycemia uncontrolled hemoglobin A1c 10.3 patient is on chronic prednisone. CBG (last 3)  Recent Labs    01/14/20 2113 01/15/20 0621 01/15/20 1110  GLUCAP 265* 236* 294*    Continue SSI and lantus  #7 history of pancreatic insufficiency on Creon status post resection of the duodenal adenocarcinoma  #8 hyperlipidemia on statin  #9  Abla/anemia of chronic disease he received 3 units  Of prbc thus far    Estimated body mass index is 21.17 kg/m as calculated from the following:   Height as of this encounter: 6' (1.829 m).   Weight as of this encounter: 70.8 kg.  DVT prophylaxis: SCD Heparin stopped due to right stump bleeding  code Status: Full code Family Communication: None at bedside Disposition Plan:  Status is: Inpatient  Dispo:  Patient From: Home  Planned Disposition: Home with Health Care Svc  Expected discharge date: tbd  Medically stable for discharge: No    Consultants: Ortho, cardiology, nephrology Procedures: None Antimicrobials: Vanco and cefepime  Subjective: He is resting  in bed awake and alert he is able to tolerate breakfast this morning.  No nausea vomiting reported no further bleeding overnight Heparin has been stopped.  Objective: Vitals:   01/14/20 2045 01/14/20 2315 01/15/20 0346 01/15/20 1108  BP: (!) 128/58 (!) 146/63 (!) 154/67 (!)  124/57  Pulse: 61 (!) 58 (!) 58 62  Resp: 12 11 14 14   Temp: 98 F (36.7 C) 98 F (36.7 C) 98.5 F (36.9 C) 98.7 F (37.1 C)  TempSrc: Axillary Oral Oral Oral  SpO2:  96% 93% 99%  Weight:      Height:        Intake/Output Summary (Last 24 hours) at 01/15/2020 1201 Last data filed at 01/15/2020 0352 Gross per 24 hour  Intake 890 ml  Output 300 ml  Net 590 ml   Filed Weights   01/08/20 0840 01/08/20 1215 01/11/20 1441  Weight: 66.7 kg 65.9 kg 70.8 kg    Examination:  General exam: Appears calm and comfortable  Respiratory system: Clear to auscultation. Respiratory effort normal. Cardiovascular system: S1 & S2 heard, RRR. No JVD, murmurs, rubs, gallops or clicks. No pedal edema. Gastrointestinal system: Abdomen is nondistended, soft and nontender. No organomegaly or masses felt. Normal bowel sounds heard. Central nervous system: Alert and oriented. No focal neurological deficits. Extremities: Bilateral BKA right stump covered with dressing  skin: No rashes, lesions or ulcers Psychiatry: Judgement and insight appear normal. Mood & affect appropriate.     Data Reviewed: I have personally reviewed following labs and imaging studies  CBC: Recent Labs  Lab 01/12/20 0712 01/12/20 0712 01/12/20 2026 01/13/20 0042 01/13/20 0852 01/14/20 1115 01/15/20 0030  WBC 11.4*  --   --  16.3* 22.6* 9.7 9.7  HGB 8.1*   < > 7.3* 6.3* 7.0* 5.9* 8.3*  HCT 26.2*   < > 24.3* 21.7* 22.6* 19.6* 25.9*  MCV 93.2  --   --  96.0 94.6 94.7 91.5  PLT 127*  --   --  142* 141* 116* 124*   < > = values in this interval not displayed.   Basic Metabolic Panel: Recent Labs  Lab 01/09/20 0123 01/10/20 0059 01/11/20 0854 01/12/20 0712 01/14/20 1115  NA 134* 133* 132* 137 137  K 4.4 5.1 4.6 4.0 4.2  CL 101 102 103 103 103  CO2 27 22 19* 25 27  GLUCOSE 311* 406* 293* 195* 195*  BUN 21 35* 54* 25* 17  CREATININE 2.29* 3.37* 4.17* 2.48* 2.31*  CALCIUM 7.9* 8.0* 8.3* 8.1* 7.8*  MG 2.0  --   --    --   --   PHOS  --  1.9* 1.8* 1.6* 2.7   GFR: Estimated Creatinine Clearance: 31.5 mL/min (A) (by C-G formula based on SCr of 2.31 mg/dL (H)). Liver Function Tests: Recent Labs  Lab 01/10/20 0059 01/11/20 0854 01/12/20 0712 01/14/20 1115  ALBUMIN 1.6* 1.6* 1.6* 1.8*   No results for input(s): LIPASE, AMYLASE in the last 168 hours. No results for input(s): AMMONIA in the last 168 hours. Coagulation Profile: No results for input(s): INR, PROTIME in the last 168 hours. Cardiac Enzymes: No results for input(s): CKTOTAL, CKMB, CKMBINDEX, TROPONINI in the last 168 hours. BNP (last 3 results) No results for input(s): PROBNP in the last 8760 hours. HbA1C: No results for input(s): HGBA1C in the last 72 hours. CBG: Recent Labs  Lab 01/14/20 1056 01/14/20 1606 01/14/20 2113 01/15/20 0621 01/15/20 1110  GLUCAP 185* 161* 265* 236* 294*   Lipid Profile: No  results for input(s): CHOL, HDL, LDLCALC, TRIG, CHOLHDL, LDLDIRECT in the last 72 hours. Thyroid Function Tests: No results for input(s): TSH, T4TOTAL, FREET4, T3FREE, THYROIDAB in the last 72 hours. Anemia Panel: No results for input(s): VITAMINB12, FOLATE, FERRITIN, TIBC, IRON, RETICCTPCT in the last 72 hours. Sepsis Labs: No results for input(s): PROCALCITON, LATICACIDVEN in the last 168 hours.  Recent Results (from the past 240 hour(s))  Surgical PCR screen     Status: Abnormal   Collection Time: 01/11/20 10:38 PM   Specimen: Nasal Mucosa; Nasal Swab  Result Value Ref Range Status   MRSA, PCR NEGATIVE NEGATIVE Final   Staphylococcus aureus POSITIVE (A) NEGATIVE Final    Comment: (NOTE) The Xpert SA Assay (FDA approved for NASAL specimens in patients 30 years of age and older), is one component of a comprehensive surveillance program. It is not intended to diagnose infection nor to guide or monitor treatment. Performed at [facility], [place] [address] [place], [name] [address]          Radiology  Studies: No results found.      Scheduled Meds: . amiodarone  200 mg Oral Daily  . amLODipine  5 mg Oral Daily  . aspirin EC  81 mg Oral Daily  . atorvastatin  10 mg Oral Q M,W,F  . Chlorhexidine Gluconate Cloth  6 each Topical Q0600  . Chlorhexidine Gluconate Cloth  6 each Topical Q0600  . Chlorhexidine Gluconate Cloth  6 each Topical Q0600  . Chlorhexidine Gluconate Cloth  6 each Topical Q0600  . cholecalciferol  4,000 Units Oral Daily  . darbepoetin (ARANESP) injection - DIALYSIS  60 mcg Intravenous Q Thu-HD  . docusate sodium  100 mg Oral BID  . insulin aspart  0-9 Units Subcutaneous TID WC  . insulin aspart  2 Units Subcutaneous TID WC  . insulin glargine  6 Units Subcutaneous Daily  . lipase/protease/amylase  12,000 Units Oral TID AC  . magnesium chloride  2 tablet Oral BID  . mupirocin ointment  1 application Nasal BID  . mycophenolate  180 mg Oral BID  . pantoprazole  40 mg Oral Daily  . polyethylene glycol  17 g Oral Daily  . predniSONE  5 mg Oral Q breakfast  . sertraline  50 mg Oral Daily  . tacrolimus  2 mg Oral BID  . tamsulosin  0.4 mg Oral QPC supper   Continuous Infusions: . sodium chloride 50 mL/hr at 01/12/20 2338     LOS: 15 days     Georgette Shell, MD  01/15/2020, 12:01 PM

## 2020-01-15 NOTE — Plan of Care (Signed)
  Problem: Education: Goal: Knowledge of General Education information will improve Description: Including pain rating scale, medication(s)/side effects and non-pharmacologic comfort measures Outcome: Progressing   Problem: Health Behavior/Discharge Planning: Goal: Ability to manage health-related needs will improve Outcome: Progressing   Problem: Clinical Measurements: Goal: Will remain free from infection Outcome: Progressing Goal: Diagnostic test results will improve Outcome: Progressing Goal: Respiratory complications will improve Outcome: Progressing Goal: Cardiovascular complication will be avoided Outcome: Progressing   Problem: Nutrition: Goal: Adequate nutrition will be maintained Outcome: Progressing

## 2020-01-15 NOTE — Progress Notes (Signed)
[facility] NEPHROLOGY PROGRESS NOTE  Assessment/ Plan: Pt is a 64 y.o. yo male with DDRT 2015 CKD4, AoCKD Afib DM HTN b/l BKA h/o duodenal adenocarcinoma s/p resection.Allograft biopsy which showed interstitial fibrosis andCIN.Currently on Myfortic, Tac and prednisone. Baseline Cr is2.8-3.4(as recently as 10/12/2019) but appears to be slowly worsening as an outpatient.  Admitted with right BKA site infection and sepsis, renal failure  # Acute kidney injury on CKD4, now progressed to ESRD: BL cr 2.8-3.4 s/p DDRT with DGF complicated by ACR and antibody mediated rejection requiring IVIG as recently as 2018. Started HD in the hospital.  No sign of renal recovery therefore we will continue TTS schedule for dialysis.  He has outpatient dialysis set up for TTS at [facility]. Patient has functional dialysis access. Plan for HD today.  #Kidney transplant: Continue tacrolimus, mycophenolate, prednisone-tac tapered to 2/2 on 8/18 -> mycophenolate tapered to 180 mg twice daily on 8/22.  Pharmacy ordering tacrolimus level since patient is on amiodarone.  #Hypertension/volume: Continue amlodipine.  Managing volume with dialysis.  #Secondary hyperparathyroidism/hyperphosphatemia: PTH level 43.  Phosphorus level was low off binders, monitor lab.  #Anemia of CKD, acute illness: Drop in hemoglobin due to bleeding at the surgical site.  Received blood transfusion.  Continue Aranesp.  Iron saturation 81 and currently on antibiotics.  Monitor hemoglobin and transfuse as needed.  #Severe sepsis due to right leg osteomyelitis/abscess on BKA stump site: Currently on vancomycin.  Status post revision of right below-knee amputation by Dr. Sharol Given on 8/25.  May go to inpatient rehab.  Subjective: Seen and examined at bedside.  Drop in hemoglobin required blood transfusion yesterday.  Denies nausea vomiting chest pain shortness of breath.  Heparin on hold.    Objective Vital signs in last 24  hours: Vitals:   01/14/20 2025 01/14/20 2045 01/14/20 2315 01/15/20 0346  BP: (!) 131/57 (!) 128/58 (!) 146/63 (!) 154/67  Pulse: 62 61 (!) 58 (!) 58  Resp: (!) 4 12 11 14   Temp: 98.2 F (36.8 C) 98 F (36.7 C) 98 F (36.7 C) 98.5 F (36.9 C)  TempSrc: Oral Axillary Oral Oral  SpO2: 97%  96% 93%  Weight:      Height:       Weight change:   Intake/Output Summary (Last 24 hours) at 01/15/2020 0950 Last data filed at 01/15/2020 0352 Gross per 24 hour  Intake 890 ml  Output 300 ml  Net 590 ml       Labs: Basic Metabolic Panel: Recent Labs  Lab 01/11/20 0854 01/12/20 0712 01/14/20 1115  NA 132* 137 137  K 4.6 4.0 4.2  CL 103 103 103  CO2 19* 25 27  GLUCOSE 293* 195* 195*  BUN 54* 25* 17  CREATININE 4.17* 2.48* 2.31*  CALCIUM 8.3* 8.1* 7.8*  PHOS 1.8* 1.6* 2.7   Liver Function Tests: Recent Labs  Lab 01/11/20 0854 01/12/20 0712 01/14/20 1115  ALBUMIN 1.6* 1.6* 1.8*   No results for input(s): LIPASE, AMYLASE in the last 168 hours. No results for input(s): AMMONIA in the last 168 hours. CBC: Recent Labs  Lab 01/12/20 0712 01/12/20 2026 01/13/20 0042 01/13/20 0042 01/13/20 0852 01/14/20 1115 01/15/20 0030  WBC 11.4*  --  16.3*   < > 22.6* 9.7 9.7  HGB 8.1*   < > 6.3*   < > 7.0* 5.9* 8.3*  HCT 26.2*   < > 21.7*   < > 22.6* 19.6* 25.9*  MCV 93.2  --  96.0  --  94.6 94.7 91.5  PLT 127*  --  142*   < > 141* 116* 124*   < > = values in this interval not displayed.   Cardiac Enzymes: No results for input(s): CKTOTAL, CKMB, CKMBINDEX, TROPONINI in the last 168 hours. CBG: Recent Labs  Lab 01/14/20 0933 01/14/20 1056 01/14/20 1606 01/14/20 2113 01/15/20 0621  GLUCAP 231* 185* 161* 265* 236*    Iron Studies: No results for input(s): IRON, TIBC, TRANSFERRIN, FERRITIN in the last 72 hours. Studies/Results: No results found.  Medications: Infusions: . sodium chloride 50 mL/hr at 01/12/20 2338    Scheduled Medications: . amiodarone  200 mg Oral  Daily  . amLODipine  5 mg Oral Daily  . aspirin EC  81 mg Oral Daily  . atorvastatin  10 mg Oral Q M,W,F  . Chlorhexidine Gluconate Cloth  6 each Topical Q0600  . Chlorhexidine Gluconate Cloth  6 each Topical Q0600  . Chlorhexidine Gluconate Cloth  6 each Topical Q0600  . Chlorhexidine Gluconate Cloth  6 each Topical Q0600  . cholecalciferol  4,000 Units Oral Daily  . darbepoetin (ARANESP) injection - DIALYSIS  60 mcg Intravenous Q Thu-HD  . docusate sodium  100 mg Oral BID  . insulin aspart  0-9 Units Subcutaneous TID WC  . insulin aspart  2 Units Subcutaneous TID WC  . insulin glargine  6 Units Subcutaneous Daily  . lipase/protease/amylase  12,000 Units Oral TID AC  . magnesium chloride  2 tablet Oral BID  . mupirocin ointment  1 application Nasal BID  . mycophenolate  180 mg Oral BID  . pantoprazole  40 mg Oral Daily  . polyethylene glycol  17 g Oral Daily  . predniSONE  5 mg Oral Q breakfast  . sertraline  50 mg Oral Daily  . tacrolimus  2 mg Oral BID  . tamsulosin  0.4 mg Oral QPC supper    have reviewed scheduled and prn medications.  Physical Exam: General: Sitting on bed comfortable, not in distress Heart:RRR, s1s2 nl, no rub Lungs: Clear b/l, no crackle Abdomen:soft, Non-tender, non-distended Extremities: Bilateral BKA, right BKA site has bandage applied. Dialysis Access: Right AV fistula has good thrill and bruit.  Paul [name] 01/15/2020,9:50 AM  LOS: 15 days  Pager: [phone]

## 2020-01-16 LAB — CBC
HCT: 29 % — ABNORMAL LOW (ref 39.0–52.0)
Hemoglobin: 8.9 g/dL — ABNORMAL LOW (ref 13.0–17.0)
MCH: 29 pg (ref 26.0–34.0)
MCHC: 30.7 g/dL (ref 30.0–36.0)
MCV: 94.5 fL (ref 80.0–100.0)
Platelets: 122 10*3/uL — ABNORMAL LOW (ref 150–400)
RBC: 3.07 MIL/uL — ABNORMAL LOW (ref 4.22–5.81)
RDW: 16.2 % — ABNORMAL HIGH (ref 11.5–15.5)
WBC: 9 10*3/uL (ref 4.0–10.5)
nRBC: 0 % (ref 0.0–0.2)

## 2020-01-16 LAB — RENAL FUNCTION PANEL
Albumin: 1.8 g/dL — ABNORMAL LOW (ref 3.5–5.0)
Anion gap: 8 (ref 5–15)
BUN: 19 mg/dL (ref 8–23)
CO2: 28 mmol/L (ref 22–32)
Calcium: 8 mg/dL — ABNORMAL LOW (ref 8.9–10.3)
Chloride: 105 mmol/L (ref 98–111)
Creatinine, Ser: 2.22 mg/dL — ABNORMAL HIGH (ref 0.61–1.24)
GFR calc Af Amer: 35 mL/min — ABNORMAL LOW (ref >60)
GFR calc non Af Amer: 30 mL/min — ABNORMAL LOW (ref >60)
Glucose, Bld: 255 mg/dL — ABNORMAL HIGH (ref 70–99)
Phosphorus: 2.9 mg/dL (ref 2.5–4.6)
Potassium: 4.5 mmol/L (ref 3.5–5.1)
Sodium: 141 mmol/L (ref 135–145)

## 2020-01-16 LAB — GLUCOSE, CAPILLARY
Glucose-Capillary: 232 mg/dL — ABNORMAL HIGH (ref 70–99)
Glucose-Capillary: 353 mg/dL — ABNORMAL HIGH (ref 70–99)
Glucose-Capillary: 155 mg/dL — ABNORMAL HIGH (ref 70–99)
Glucose-Capillary: 124 mg/dL — ABNORMAL HIGH (ref 70–99)

## 2020-01-16 MED ORDER — HEPARIN (PORCINE) 25000 UT/250ML-% IV SOLN
1200.0000 [IU]/h | INTRAVENOUS | Status: DC
  Administered 2020-01-16 – 2020-01-17 (×2): 1200 [IU]/h via INTRAVENOUS
  Filled 2020-01-16 (×2): qty 250

## 2020-01-16 MED ORDER — BISACODYL 10 MG RE SUPP
10.0000 mg | Freq: Once | RECTAL | Status: AC
  Administered 2020-01-16: 10 mg via RECTAL
  Filled 2020-01-16: qty 1

## 2020-01-16 MED ORDER — BISACODYL 5 MG PO TBEC
10.0000 mg | DELAYED_RELEASE_TABLET | Freq: Every day | ORAL | Status: DC
  Administered 2020-01-16 – 2020-01-18 (×3): 10 mg via ORAL
  Filled 2020-01-16 (×3): qty 2

## 2020-01-16 MED ORDER — INSULIN GLARGINE 100 UNIT/ML ~~LOC~~ SOLN
8.0000 [IU] | Freq: Every day | SUBCUTANEOUS | Status: DC
  Administered 2020-01-17 – 2020-01-18 (×2): 8 [IU] via SUBCUTANEOUS
  Filled 2020-01-16 (×2): qty 0.08

## 2020-01-16 NOTE — Plan of Care (Signed)

## 2020-01-16 NOTE — Progress Notes (Signed)
[facility] NEPHROLOGY PROGRESS NOTE  Assessment/ Plan: Pt is a 64 y.o. yo male with DDRT 2015 CKD4, AoCKD Afib DM HTN b/l BKA h/o duodenal adenocarcinoma s/p resection.Allograft biopsy which showed interstitial fibrosis andCIN.Currently on Myfortic, Tac and prednisone. Baseline Cr is2.8-3.4(as recently as 10/12/2019) but appears to be slowly worsening as an outpatient.  Admitted with right BKA site infection and sepsis, renal failure  # Acute kidney injury on CKD4, now progressed to ESRD: BL cr 2.8-3.4 s/p DDRT with DGF complicated by ACR and antibody mediated rejection requiring IVIG as recently as 2018. Started HD in the hospital.  No sign of renal recovery therefore we will continue TTS schedule for dialysis.  He has outpatient dialysis set up for TTS at [facility]. Patient has functional dialysis access. Status post HD yesterday with 2 L UF.  Clinically stable.  Plan for next HD on 8/31.  #Kidney transplant: Continue tacrolimus, mycophenolate, prednisone-tac tapered to 2/2 on 8/18 -> mycophenolate tapered to 180 mg twice daily on 8/22.  Pharmacy ordering tacrolimus level since patient is on amiodarone, follow-up.  #Hypertension/volume: Continue amlodipine.  Managing volume with dialysis.  #Secondary hyperparathyroidism/hyperphosphatemia: PTH level 43.  Phosphorus level improving, off binders, monitor lab.  #Anemia of CKD, acute blood loss anemia: Received blood transfusion. Continue Aranesp.  Iron saturation 81   Monitor hemoglobin and transfuse as needed.  #Severe sepsis due to right leg osteomyelitis/abscess on BKA stump site: Treated with antibiotics.  Status post revision of right below-knee amputation by Dr. Sharol Given on 8/25.  May go to inpatient rehab.  Subjective: Seen and examined at bedside.  Drop in hemoglobin therefore received blood transfusion.  Off of heparin.  Denies nausea vomiting chest pain shortness of breath.  Tolerating dialysis.   Objective Vital  signs in last 24 hours: Vitals:   01/15/20 1900 01/16/20 0030 01/16/20 0400 01/16/20 0728  BP: 132/65 (!) 153/65 (!) 159/63 (!) 161/67  Pulse: 60 61 61 63  Resp: 16 (!) 9  14  Temp: 98.1 F (36.7 C)  98 F (36.7 C) 98.2 F (36.8 C)  TempSrc:   Oral Oral  SpO2: 97% 92% 94% 94%  Weight:      Height:       Weight change:   Intake/Output Summary (Last 24 hours) at 01/16/2020 0920 Last data filed at 01/15/2020 2124 Gross per 24 hour  Intake 1000 ml  Output 2100 ml  Net -1100 ml       Labs: Basic Metabolic Panel: Recent Labs  Lab 01/14/20 1115 01/15/20 1436 01/16/20 0538  NA 137 136 141  K 4.2 4.8 4.5  CL 103 104 105  CO2 27 25 28   GLUCOSE 195* 209* 255*  BUN 17 36* 19  CREATININE 2.31* 3.59* 2.22*  CALCIUM 7.8* 7.9* 8.0*  PHOS 2.7 3.7 2.9   Liver Function Tests: Recent Labs  Lab 01/14/20 1115 01/15/20 1436 01/16/20 0538  ALBUMIN 1.8* 1.7* 1.8*   No results for input(s): LIPASE, AMYLASE in the last 168 hours. No results for input(s): AMMONIA in the last 168 hours. CBC: Recent Labs  Lab 01/12/20 0712 01/12/20 2026 01/13/20 0042 01/13/20 0042 01/13/20 0852 01/14/20 1115 01/15/20 0030  WBC 11.4*  --  16.3*   < > 22.6* 9.7 9.7  HGB 8.1*   < > 6.3*   < > 7.0* 5.9* 8.3*  HCT 26.2*   < > 21.7*   < > 22.6* 19.6* 25.9*  MCV 93.2  --  96.0  --  94.6 94.7 91.5  PLT 127*  --  142*   < > 141* 116* 124*   < > = values in this interval not displayed.   Cardiac Enzymes: No results for input(s): CKTOTAL, CKMB, CKMBINDEX, TROPONINI in the last 168 hours. CBG: Recent Labs  Lab 01/14/20 2113 01/15/20 0621 01/15/20 1110 01/15/20 2124 01/16/20 0659  GLUCAP 265* 236* 294* 227* 232*    Iron Studies: No results for input(s): IRON, TIBC, TRANSFERRIN, FERRITIN in the last 72 hours. Studies/Results: No results found.  Medications: Infusions:  sodium chloride 50 mL/hr at 01/12/20 2338    Scheduled Medications:  amiodarone  200 mg Oral Daily   amLODipine   5 mg Oral Daily   aspirin EC  81 mg Oral Daily   atorvastatin  10 mg Oral Q M,W,F   Chlorhexidine Gluconate Cloth  6 each Topical Q0600   cholecalciferol  4,000 Units Oral Daily   darbepoetin (ARANESP) injection - DIALYSIS  60 mcg Intravenous Q Thu-HD   docusate sodium  100 mg Oral BID   insulin aspart  0-9 Units Subcutaneous TID WC   insulin aspart  2 Units Subcutaneous TID WC   insulin glargine  6 Units Subcutaneous Daily   lipase/protease/amylase  12,000 Units Oral TID AC   magnesium chloride  2 tablet Oral BID   mupirocin ointment  1 application Nasal BID   mycophenolate  180 mg Oral BID   pantoprazole  40 mg Oral Daily   polyethylene glycol  17 g Oral Daily   predniSONE  5 mg Oral Q breakfast   sertraline  50 mg Oral Daily   tacrolimus  2 mg Oral BID   tamsulosin  0.4 mg Oral QPC supper    have reviewed scheduled and prn medications.  Physical Exam: General: Not in distress, comfortable Heart:RRR, s1s2 nl, no rub Lungs: Clear b/l, no crackle Abdomen:soft, Non-tender, non-distended Extremities: Bilateral BKA, right BKA site has bandage applied. Dialysis Access: Right AV fistula has good thrill and bruit.  Dimond Crotty Prasad Tamela Elsayed 01/16/2020,9:20 AM  LOS: 16 days  Pager: [phone]

## 2020-01-16 NOTE — Progress Notes (Signed)
Disimpacted pt for large amount of hard constipated stool. Pt feeling better,

## 2020-01-16 NOTE — Progress Notes (Signed)
[place] for heparin Indication: atrial fibrillation  Allergies  Allergen Reactions  . Metformin And Related Other (See Comments)    Has kidney problems    Patient Measurements: Height: 6' (182.9 cm) Weight: 71 kg (156 lb 8.4 oz) IBW/kg (Calculated) : 77.6 Heparin Dosing Weight: 71 kg  Vital Signs: Temp: 98.9 F (37.2 C) (08/29 1109) Temp Source: Oral (08/29 1109) BP: 173/68 (08/29 1109) Pulse Rate: 65 (08/29 1109)  Labs: Recent Labs    01/14/20 1115 01/15/20 0030 01/15/20 1436 01/16/20 0538  HGB 5.9* 8.3*  --   --   HCT 19.6* 25.9*  --   --   PLT 116* 124*  --   --   CREATININE 2.31*  --  3.59* 2.22*    Estimated Creatinine Clearance: 32.9 mL/min (A) (by C-G formula based on SCr of 2.22 mg/dL (H)).   Medical History: Past Medical History:  Diagnosis Date  . A-fib ([place])   . Anemia    esrd  . Calciphylaxis 05/2013   lower ext  . Cancer ([place])    hx duodenal adenoCa 2002, resected  . Closed left arm fracture 1967  . Corneal abrasion, left   . Depression   . Diabetes mellitus    Type 1 iddm x 11 yrs  . Diabetic neuropathy ([place])   . ESRD on hemodialysis ([place])    Pt has ESRD due to DM.  He had a L upper arm AVF prior to starting HD which was ligated due to L arm steal syndrome.  He started HD in Jan 2014 and did home HD.  The family couldn't cannulate the R arm AVF successfully so by mid 2014 they decided to switch to PD which was done late summer 2014.  In Jan 2015 he was admitted with FTT felt to be due to underdialysis on PD and PD was abandoned and he   . GERD (gastroesophageal reflux disease)   . Glomerulosclerosis, diabetic ([place])   . Heart murmur   . History of blood transfusion   . Hypertension    off of meds due to orthostatic hypotension  . Kidney transplant recipient   . Open wound of both legs with complication    Pt has had progressive wounds of both LE's including gangrene of the toes and patchy necrosis of  the calves.  He has been treated at [facility] by Dr Jerline Pain with hyperbaric O2 5d per week.  He is getting Na Thiosulfate with HD for suspected calciphylaxis. Pt says doctor's aren't sure if these ulcers were diabetic ulcers or calciphylaxis.  He underwent bilat BKA on 07/30/13.  He says that the woun    Medications:  Infusions:  . sodium chloride 50 mL/hr at 01/12/20 2338    Assessment: 64 yo male with afib.  Eliquis held for amputation, heparin resumed post-op then held due to some bleeding.  Now resolved and pharmacy now asked to restart IV heparin.    Heparin levels previously therapeutic on 1350 units/hr, at higher end of range.    Goal of Therapy:  will target heparin leve 0.3-0.5 given recent bleeding Monitor platelets by anticoagulation protocol: Yes   Plan:  1. Start IV heparin at 1200 units/hr. 2. Check heparin level in 8 hrs. 3. Daily heparin level and CBC.  [place] Crane, Roylene Reason, BCCP Clinical Pharmacist  01/16/2020 12:59 PM   [facility] pharmacy phone numbers are listed on [email or web address]

## 2020-01-16 NOTE — Progress Notes (Signed)
PROGRESS NOTE    Paul [name]  ZOX:[id number] DOB: 02-15-1956 DOA: 12/30/2019 PCP: [name], MD    Brief Narrative: HPI on 12/31/2019 by Dr. Ysidro Evert Bairdis a 64 y.o.malewith medical history significant ofkidney transplant recipient(2016)with CKD stage IV,hypertension, IDDM,chronic A. fib on Xarelto, B/L BKAsecondary to PVD, presented with new onset of rightBKA stump pain and rash.Symptoms started about 1 week ago, patient attributed that to a unfit prosthetic which might caused a skin abrasions.He started to notice significant pain rash associated the end of right leg stump about 3 days ago. Pain has been constant and gradually getting worse, denies any fever chills.He took some Tylenol for the pain without any relief.  Patient has been following with [facility] for the kidney transplant, he was seen in the office about 2 weeks ago and multiple blood work and urine analysis sent,urine protein/Cre>5,000,patient however was not informed about results yet. And patient claims his kidney function has been stable,denies any back pain, decreased urine output or urine color.   Assessment & Plan:   Principal Problem:   Sepsis ([place]) Active Problems:   ESRD on hemodialysis ([place])   Anemia   DM (diabetes mellitus) ([place])   S/P BKA (below knee amputation) bilateral (H[facility])   AF (paroxysmal atrial fibrillation) (H[facility])   AKI (acute kidney injury) ([place])   CKD stage 4 due to type 2 diabetes mellitus ([place])   Cellulitis   Amputation stump infection ([place])   Subacute osteomyelitis of right tibia (H[facility])   Dehiscence of amputation stump ([place])   #1 sepsis present on admission secondary to right stump osteomyelitis and abscess status post revision 01/12/2020. On vancomycin and cefepime.  #2 A. fib with RVR patient has chronic atrial fibrillation.  Seen by cardiology started on amiodarone.  Eliquis on hold for the surgery.  He was on heparin drip.  Heparin drip  has been stopped since he started bleeding from the right stump.  Ortho recommending to hold heparin for 3 days, patient has had no further bleeding will restart heparin cautiously today.    #3 acute metabolic encephalopathy seems to have resolved patient is awake alert oriented x3.  #4 AKI on CKD stage IV with severe metabolic acidosis patient with history of kidney transplant. On tacrolimus prednisone and mycophenolate Followed by nephrology   He was started on dialysis during this hospital admission.  #5 history of essential hypertension on amlodipine.  Clonidine held  #6 type 2 diabetes with hyperglycemia uncontrolled hemoglobin A1c 10.3 patient is on chronic prednisone. CBG (last 3)  Recent Labs    01/15/20 2124 01/16/20 0659 01/16/20 1111  GLUCAP 227* 232* 353*    Continue SSI and lantus Increase the dose of Lantus.  #7 history of pancreatic insufficiency on Creon status post resection of the duodenal adenocarcinoma  #8 hyperlipidemia on statin  #9  Abla/anemia of chronic disease he received 3 units  Of prbc thus far.  Check CBC today.  Starting heparin today for A. fib.  Pharmacy consulted.  #10 constipation continue MiraLAX and Dulcolax.    Estimated body mass index is 21.23 kg/m as calculated from the following:   Height as of this encounter: 6' (1.829 m).   Weight as of this encounter: 71 kg.  DVT prophylaxis: SCD Heparin stopped due to right stump bleeding  code Status: Full code Family Communication: None at bedside Disposition Plan:  Status is: Inpatient  Dispo:  Patient From: Home  Planned Disposition: Home with Health Care Svc  Expected  discharge date: tbd  Medically stable for discharge: No    Consultants: Ortho, cardiology, nephrology Procedures: None Antimicrobials: Vanco and cefepime  Subjective: He is resting in bed he has no complaints staff reported he was very constipated and had to disimpact. Objective: Vitals:   01/16/20 0030 01/16/20  0400 01/16/20 0728 01/16/20 1109  BP: (!) 153/65 (!) 159/63 (!) 161/67 (!) 173/68  Pulse: 61 61 63 65  Resp: (!) 9  14 14   Temp:  98 F (36.7 C) 98.2 F (36.8 C) 98.9 F (37.2 C)  TempSrc:  Oral Oral Oral  SpO2: 92% 94% 94% 98%  Weight:      Height:        Intake/Output Summary (Last 24 hours) at 01/16/2020 1235 Last data filed at 01/15/2020 2124 Gross per 24 hour  Intake 1000 ml  Output 2100 ml  Net -1100 ml   Filed Weights   01/08/20 1215 01/11/20 1441 01/15/20 1450  Weight: 65.9 kg 70.8 kg 71 kg    Examination:  General exam: Appears calm and comfortable  Respiratory system: Clear to auscultation. Respiratory effort normal. Cardiovascular system: S1 & S2 heard, RRR. No JVD, murmurs, rubs, gallops or clicks. No pedal edema. Gastrointestinal system: Abdomen is nondistended, soft and nontender. No organomegaly or masses felt. Normal bowel sounds heard. Central nervous system: Alert and oriented. No focal neurological deficits. Extremities: Bilateral BKA right stump covered with dressing  skin: No rashes, lesions or ulcers Psychiatry: Judgement and insight appear normal. Mood & affect appropriate.     Data Reviewed: I have personally reviewed following labs and imaging studies  CBC: Recent Labs  Lab 01/12/20 0712 01/12/20 0712 01/12/20 2026 01/13/20 0042 01/13/20 0852 01/14/20 1115 01/15/20 0030  WBC 11.4*  --   --  16.3* 22.6* 9.7 9.7  HGB 8.1*   < > 7.3* 6.3* 7.0* 5.9* 8.3*  HCT 26.2*   < > 24.3* 21.7* 22.6* 19.6* 25.9*  MCV 93.2  --   --  96.0 94.6 94.7 91.5  PLT 127*  --   --  142* 141* 116* 124*   < > = values in this interval not displayed.   Basic Metabolic Panel: Recent Labs  Lab 01/11/20 0854 01/12/20 0712 01/14/20 1115 01/15/20 1436 01/16/20 0538  NA 132* 137 137 136 141  K 4.6 4.0 4.2 4.8 4.5  CL 103 103 103 104 105  CO2 19* 25 27 25 28   GLUCOSE 293* 195* 195* 209* 255*  BUN 54* 25* 17 36* 19  CREATININE 4.17* 2.48* 2.31* 3.59* 2.22*   CALCIUM 8.3* 8.1* 7.8* 7.9* 8.0*  PHOS 1.8* 1.6* 2.7 3.7 2.9   GFR: Estimated Creatinine Clearance: 32.9 mL/min (A) (by C-G formula based on SCr of 2.22 mg/dL (H)). Liver Function Tests: Recent Labs  Lab 01/11/20 0854 01/12/20 0712 01/14/20 1115 01/15/20 1436 01/16/20 0538  ALBUMIN 1.6* 1.6* 1.8* 1.7* 1.8*   No results for input(s): LIPASE, AMYLASE in the last 168 hours. No results for input(s): AMMONIA in the last 168 hours. Coagulation Profile: No results for input(s): INR, PROTIME in the last 168 hours. Cardiac Enzymes: No results for input(s): CKTOTAL, CKMB, CKMBINDEX, TROPONINI in the last 168 hours. BNP (last 3 results) No results for input(s): PROBNP in the last 8760 hours. HbA1C: No results for input(s): HGBA1C in the last 72 hours. CBG: Recent Labs  Lab 01/15/20 0621 01/15/20 1110 01/15/20 2124 01/16/20 0659 01/16/20 1111  GLUCAP 236* 294* 227* 232* 353*   Lipid Profile: No results for  input(s): CHOL, HDL, LDLCALC, TRIG, CHOLHDL, LDLDIRECT in the last 72 hours. Thyroid Function Tests: No results for input(s): TSH, T4TOTAL, FREET4, T3FREE, THYROIDAB in the last 72 hours. Anemia Panel: No results for input(s): VITAMINB12, FOLATE, FERRITIN, TIBC, IRON, RETICCTPCT in the last 72 hours. Sepsis Labs: No results for input(s): PROCALCITON, LATICACIDVEN in the last 168 hours.  Recent Results (from the past 240 hour(s))  Surgical PCR screen     Status: Abnormal   Collection Time: 01/11/20 10:38 PM   Specimen: Nasal Mucosa; Nasal Swab  Result Value Ref Range Status   MRSA, PCR NEGATIVE NEGATIVE Final   Staphylococcus aureus POSITIVE (A) NEGATIVE Final    Comment: (NOTE) The Xpert SA Assay (FDA approved for NASAL specimens in patients 47 years of age and older), is one component of a comprehensive surveillance program. It is not intended to diagnose infection nor to guide or monitor treatment. Performed at [facility], [place] [address] [place],  [place] [address]          Radiology Studies: No results found.      Scheduled Meds: . amiodarone  200 mg Oral Daily  . amLODipine  5 mg Oral Daily  . aspirin EC  81 mg Oral Daily  . atorvastatin  10 mg Oral Q M,W,F  . bisacodyl  10 mg Oral Daily  . Chlorhexidine Gluconate Cloth  6 each Topical Q0600  . cholecalciferol  4,000 Units Oral Daily  . darbepoetin (ARANESP) injection - DIALYSIS  60 mcg Intravenous Q Thu-HD  . docusate sodium  100 mg Oral BID  . insulin aspart  0-9 Units Subcutaneous TID WC  . insulin aspart  2 Units Subcutaneous TID WC  . insulin glargine  6 Units Subcutaneous Daily  . lipase/protease/amylase  12,000 Units Oral TID AC  . magnesium chloride  2 tablet Oral BID  . mycophenolate  180 mg Oral BID  . pantoprazole  40 mg Oral Daily  . polyethylene glycol  17 g Oral Daily  . predniSONE  5 mg Oral Q breakfast  . sertraline  50 mg Oral Daily  . tacrolimus  2 mg Oral BID  . tamsulosin  0.4 mg Oral QPC supper   Continuous Infusions: . sodium chloride 50 mL/hr at 01/12/20 2338     LOS: 16 days     Georgette Shell, MD  01/16/2020, 12:35 PM

## 2020-01-17 LAB — GLUCOSE, CAPILLARY
Glucose-Capillary: 154 mg/dL — ABNORMAL HIGH (ref 70–99)
Glucose-Capillary: 323 mg/dL — ABNORMAL HIGH (ref 70–99)
Glucose-Capillary: 193 mg/dL — ABNORMAL HIGH (ref 70–99)
Glucose-Capillary: 152 mg/dL — ABNORMAL HIGH (ref 70–99)

## 2020-01-17 LAB — CBC
HCT: 26.8 % — ABNORMAL LOW (ref 39.0–52.0)
Hemoglobin: 8.2 g/dL — ABNORMAL LOW (ref 13.0–17.0)
MCH: 28.8 pg (ref 26.0–34.0)
MCHC: 30.6 g/dL (ref 30.0–36.0)
MCV: 94 fL (ref 80.0–100.0)
Platelets: 116 10*3/uL — ABNORMAL LOW (ref 150–400)
RBC: 2.85 MIL/uL — ABNORMAL LOW (ref 4.22–5.81)
RDW: 15.6 % — ABNORMAL HIGH (ref 11.5–15.5)
WBC: 9.5 10*3/uL (ref 4.0–10.5)
nRBC: 0 % (ref 0.0–0.2)

## 2020-01-17 LAB — TACROLIMUS LEVEL: Tacrolimus (FK506) - LabCorp: 7.6 ng/mL (ref 2.0–20.0)

## 2020-01-17 LAB — HEPARIN LEVEL (UNFRACTIONATED): Heparin Unfractionated: 0.31 IU/mL (ref 0.30–0.70)

## 2020-01-17 MED ORDER — CHLORHEXIDINE GLUCONATE CLOTH 2 % EX PADS: 6.0000 | MEDICATED_PAD | Freq: Every day | CUTANEOUS | Status: DC

## 2020-01-17 MED ORDER — APIXABAN 5 MG PO TABS
5.0000 mg | ORAL_TABLET | Freq: Two times a day (BID) | ORAL | Status: DC
  Administered 2020-01-17 – 2020-01-18 (×3): 5 mg via ORAL
  Filled 2020-01-17 (×2): qty 1

## 2020-01-17 NOTE — Progress Notes (Signed)
[place] for Heparin Indication: atrial fibrillation  Allergies  Allergen Reactions  . Metformin And Related Other (See Comments)    Has kidney problems    Patient Measurements: Height: 6' (182.9 cm) Weight: 71 kg (156 lb 8.4 oz) IBW/kg (Calculated) : 77.6 Heparin Dosing Weight: 71 kg  Vital Signs: Temp: 97.8 F (36.6 C) (08/29 2330) Temp Source: Oral (08/29 2330) BP: 141/67 (08/29 2330) Pulse Rate: 60 (08/29 2330)  Labs: Recent Labs    01/14/20 1115 01/14/20 1115 01/15/20 0030 01/15/20 0030 01/15/20 1436 01/16/20 0538 01/16/20 0547 01/17/20 0057  HGB 5.9*   < > 8.3*   < >  --   --  8.9* 8.2*  HCT 19.6*   < > 25.9*  --   --   --  29.0* 26.8*  PLT 116*   < > 124*  --   --   --  122* PENDING  HEPARINUNFRC  --   --   --   --   --   --   --  0.31  CREATININE 2.31*  --   --   --  3.59* 2.22*  --   --    < > = values in this interval not displayed.    Estimated Creatinine Clearance: 32.9 mL/min (A) (by C-G formula based on SCr of 2.22 mg/dL (H)).   Medical History: Past Medical History:  Diagnosis Date  . A-fib ([place])   . Anemia    esrd  . Calciphylaxis 05/2013   lower ext  . Cancer ([place])    hx duodenal adenoCa 2002, resected  . Closed left arm fracture 1967  . Corneal abrasion, left   . Depression   . Diabetes mellitus    Type 1 iddm x 11 yrs  . Diabetic neuropathy ([place])   . ESRD on hemodialysis ([place])    Pt has ESRD due to DM.  He had a L upper arm AVF prior to starting HD which was ligated due to L arm steal syndrome.  He started HD in Jan 2014 and did home HD.  The family couldn't cannulate the R arm AVF successfully so by mid 2014 they decided to switch to PD which was done late summer 2014.  In Jan 2015 he was admitted with FTT felt to be due to underdialysis on PD and PD was abandoned and he   . GERD (gastroesophageal reflux disease)   . Glomerulosclerosis, diabetic ([place])   . Heart murmur   . History of blood  transfusion   . Hypertension    off of meds due to orthostatic hypotension  . Kidney transplant recipient   . Open wound of both legs with complication    Pt has had progressive wounds of both LE's including gangrene of the toes and patchy necrosis of the calves.  He has been treated at [facility] by Dr Jerline Pain with hyperbaric O2 5d per week.  He is getting Na Thiosulfate with HD for suspected calciphylaxis. Pt says doctor's aren't sure if these ulcers were diabetic ulcers or calciphylaxis.  He underwent bilat BKA on 07/30/13.  He says that the woun    Medications:  Infusions:  . sodium chloride 10 mL/hr (01/16/20 2116)  . heparin 1,200 Units/hr (01/16/20 1331)    Assessment: 64 yo male with afib.  Eliquis held for amputation, heparin resumed post-op then held due to some bleeding.  Now resolved and pharmacy now asked to restart IV heparin.    8/30 AM  update:  Heparin level at goal  Goal of Therapy:  will target heparin leve 0.3-0.5 given recent bleeding Monitor platelets by anticoagulation protocol: Yes   Plan:  Cont heparin 1200 units/hr 1200 heparin level  Narda Bonds, PharmD, BCPS Clinical Pharmacist Phone: [phone]

## 2020-01-17 NOTE — Progress Notes (Signed)
Inpatient Rehabilitation Admissions Coordinator  Inpatient rehab consult received. I met with patient at bedside and spoke with his wife By phone. We discussed goals and expectations of a possible CIR admit. Patient would benefit from an inpt rehab admit. I will follow up tomorrow.  Danne Baxter, RN, MSN Rehab Admissions Coordinator [phone] 01/17/2020 4:08 PM

## 2020-01-17 NOTE — Progress Notes (Signed)
Inpatient Diabetes Program Recommendations  AACE/ADA: New Consensus Statement on Inpatient Glycemic Control (2015)  Target Ranges:  Prepandial:   less than 140 mg/dL      Peak postprandial:   less than 180 mg/dL (1-2 hours)      Critically ill patients:  140 - 180 mg/dL   Results for PEDER, ALLUMS (MRN [id number]) as of 01/17/2020 13:30  Ref. Range 01/16/2020 06:59 01/16/2020 11:11 01/16/2020 17:05 01/16/2020 21:02  Glucose-Capillary Latest Ref Range: 70 - 99 mg/dL 232 (H)  5 units NOVOLOG  353 (H)  11 units NOVOLOG +  6 units LANTUS  155 (H)  4 units NOVOLOG  124 (H)   Results for MICHAELJAMES, MILNES (MRN [id number]) as of 01/17/2020 13:30  Ref. Range 01/17/2020 06:19 01/17/2020 11:31  Glucose-Capillary Latest Ref Range: 70 - 99 mg/dL 154 (H)  4 units NOVOLOG  323 (H)  9 units NOVOLOG +  8 units LANTUS     Home DM Meds: Humalog 4-8 units daily       Basaglar 10 units daily   Current Orders: Lantus 8 units Daily       Novolog 0-9 units tid      Novolog 2 units tid with meals     MD- Note Lantus increased this AM.  Still having significant post-meal CBGs.  Please consider increasing Novolog Meal Coverage to 4 units TID with meals    --Will follow patient during hospitalization--  Wyn Quaker RN, MSN, CDE Diabetes Coordinator Inpatient Glycemic Control Team Team Pager: [phone] (8a-5p)

## 2020-01-17 NOTE — Progress Notes (Signed)
[name] for Heparin>> apixaban Indication: atrial fibrillation  Allergies  Allergen Reactions  . Metformin And Related Other (See Comments)    Has kidney problems    Patient Measurements: Height: 6' (182.9 cm) Weight: 71 kg (156 lb 8.4 oz) IBW/kg (Calculated) : 77.6 Heparin Dosing Weight: 71 kg  Vital Signs: Temp: 98.6 F (37 C) (08/30 1130) Temp Source: Oral (08/30 1130) BP: 138/80 (08/30 0421) Pulse Rate: 62 (08/30 0421)  Labs: Recent Labs    01/15/20 0030 01/15/20 0030 01/15/20 1436 01/16/20 0538 01/16/20 0547 01/17/20 0057  HGB 8.3*   < >  --   --  8.9* 8.2*  HCT 25.9*  --   --   --  29.0* 26.8*  PLT 124*  --   --   --  122* 116*  HEPARINUNFRC  --   --   --   --   --  0.31  CREATININE  --   --  3.59* 2.22*  --   --    < > = values in this interval not displayed.    Estimated Creatinine Clearance: 32.9 mL/min (A) (by C-G formula based on SCr of 2.22 mg/dL (H)).   Medical History: Past Medical History:  Diagnosis Date  . A-fib ([name])   . Anemia    esrd  . Calciphylaxis 05/2013   lower ext  . Cancer ([name])    hx duodenal adenoCa 2002, resected  . Closed left arm fracture 1967  . Corneal abrasion, left   . Depression   . Diabetes mellitus    Type 1 iddm x 11 yrs  . Diabetic neuropathy ([name])   . ESRD on hemodialysis ([name])    Pt has ESRD due to DM.  He had a L upper arm AVF prior to starting HD which was ligated due to L arm steal syndrome.  He started HD in Jan 2014 and did home HD.  The family couldn't cannulate the R arm AVF successfully so by mid 2014 they decided to switch to PD which was done late summer 2014.  In Jan 2015 he was admitted with FTT felt to be due to underdialysis on PD and PD was abandoned and he   . GERD (gastroesophageal reflux disease)   . Glomerulosclerosis, diabetic ([name])   . Heart murmur   . History of blood transfusion   . Hypertension    off of meds due to orthostatic hypotension  .  Kidney transplant recipient   . Open wound of both legs with complication    Pt has had progressive wounds of both LE's including gangrene of the toes and patchy necrosis of the calves.  He has been [name] by Dr [name] with hyperbaric O2 5d per week.  He is getting Na Thiosulfate with HD for suspected calciphylaxis. Pt says doctor's aren't sure if these ulcers were diabetic ulcers or calciphylaxis.  He underwent bilat BKA on 07/30/13.  He says that the woun    Medications:  Infusions:  . sodium chloride 10 mL/hr (01/16/20 2116)    Assessment: 64 yo male with afib.  Eliquis held for amputation, heparin resumed post-op then held due to some bleeding.  Heparin was restarted 8/29 and now to restart apixaban -hg= 8.2 (low/stable), plt= 116 (low/stable)  Goal of Therapy:  Monitor platelets by anticoagulation protocol: Yes   Plan:  Discontinue heparin  Begin apixaban 5mg  po bid  Paul [name], PharmD Clinical Pharmacist **Pharmacist phone directory can now be  found on [email or web address] ([email or web address]).  Listed under [place].

## 2020-01-17 NOTE — Progress Notes (Signed)
[facility] ROUNDING NOTE   Subjective:   Brief narrative: 64 year old gentleman with a history of renal transplant 2016 CKD stage IV, hypertension insulin-dependent diabetes atrial fibrillation status post bilateral BKA secondary to PVD with new onset right BKA stump pain.  Patient was admitted 12/30/2019.  Baseline creatinine 2.8 to 3.4 mg/dL.  Started dialysis during admission for progression of his renal disease to end-stage renal disease.  There has been no signs of renal recovery.  He has been clipped to the outpatient dialysis center of [facility].  He will continue on a TTS schedule.  He had a successful dialysis treatment 01/15/2020 with 2 L ultrafiltration.  His next dialysis will be planned for 01/18/2020.  Blood pressure 138/80 pulse 62 temperature 97.5 O2 sats 96%  Labs: Sodium 141 potassium 4.5 chloride 105 CO2 28 BUN 19 creatinine 2.22 glucose 255 hemoglobin 8.2  Amiodarone 200 mg daily, amlodipine 5 mg daily, aspirin 81 mg daily, atorvastatin 10 mg daily, vitamin D3 4000 units daily, Aranesp 60 mcg every Thursday last administered 01/06/2020, insulin sliding scale, insulin Lantus 8 units daily, mycophenolate 180 mg twice daily, Protonix 40 mg daily, prednisone 5 mg daily, Zoloft 50 mg daily, tacrolimus 2 mg twice daily, Flomax 0.4 mg daily.   Objective:  Vital signs in last 24 hours:  Temp:  [97.5 F (36.4 C)-98.9 F (37.2 C)] 97.5 F (36.4 C) (08/30 0421) Pulse Rate:  [60-65] 62 (08/30 0421) Resp:  [12-16] 13 (08/30 0421) BP: (124-173)/(62-80) 138/80 (08/30 0421) SpO2:  [93 %-99 %] 98 % (08/30 0421)  Weight change:  Filed Weights   01/08/20 1215 01/11/20 1441 01/15/20 1450  Weight: 65.9 kg 70.8 kg 71 kg    Intake/Output: I/O last 3 completed shifts: In: 1665.5 [P.O.:600; I.V.:1065.5] Out: 2550 [Urine:550; Other:2000]   Intake/Output this shift:  No intake/output data recorded.  General: Not in distress, comfortable Heart:RRR, s1s2 nl,  no rub Lungs: Clear b/l, no crackle Abdomen:soft, Non-tender, non-distended Extremities: Bilateral BKA, right BKA site has bandage applied. Dialysis Access: Right AV fistula has good thrill and bruit   Basic Metabolic Panel: Recent Labs  Lab 01/11/20 0854 01/11/20 0854 01/12/20 0712 01/12/20 0712 01/14/20 1115 01/15/20 1436 01/16/20 0538  NA 132*  --  137  --  137 136 141  K 4.6  --  4.0  --  4.2 4.8 4.5  CL 103  --  103  --  103 104 105  CO2 19*  --  25  --  27 25 28   GLUCOSE 293*  --  195*  --  195* 209* 255*  BUN 54*  --  25*  --  17 36* 19  CREATININE 4.17*  --  2.48*  --  2.31* 3.59* 2.22*  CALCIUM 8.3*   < > 8.1*   < > 7.8* 7.9* 8.0*  PHOS 1.8*  --  1.6*  --  2.7 3.7 2.9   < > = values in this interval not displayed.    Liver Function Tests: Recent Labs  Lab 01/11/20 0854 01/12/20 0712 01/14/20 1115 01/15/20 1436 01/16/20 0538  ALBUMIN 1.6* 1.6* 1.8* 1.7* 1.8*   No results for input(s): LIPASE, AMYLASE in the last 168 hours. No results for input(s): AMMONIA in the last 168 hours.  CBC: Recent Labs  Lab 01/13/20 0852 01/14/20 1115 01/15/20 0030 01/16/20 0547 01/17/20 0057  WBC 22.6* 9.7 9.7 9.0 9.5  HGB 7.0* 5.9* 8.3* 8.9* 8.2*  HCT 22.6* 19.6* 25.9* 29.0* 26.8*  MCV 94.6 94.7  91.5 94.5 94.0  PLT 141* 116* 124* 122* 116*    Cardiac Enzymes: No results for input(s): CKTOTAL, CKMB, CKMBINDEX, TROPONINI in the last 168 hours.  BNP: Invalid input(s): POCBNP  CBG: Recent Labs  Lab 01/15/20 2124 01/16/20 0659 01/16/20 1111 01/16/20 1705 01/16/20 2102  GLUCAP 227* 232* 353* 155* 58*    Microbiology: Results for orders placed or performed during the hospital encounter of 12/30/19  SARS Coronavirus 2 by RT PCR (hospital order, performed in [facility] hospital lab) Nasopharyngeal Nasopharyngeal Swab     Status: None   Collection Time: 12/31/19 11:15 AM   Specimen: Nasopharyngeal Swab  Result Value Ref Range Status   SARS Coronavirus 2  NEGATIVE NEGATIVE Final    Comment: (NOTE) SARS-CoV-2 target nucleic acids are NOT DETECTED.  The SARS-CoV-2 RNA is generally detectable in upper and lower respiratory specimens during the acute phase of infection. The lowest concentration of SARS-CoV-2 viral copies this assay can detect is 250 copies / mL. A negative result does not preclude SARS-CoV-2 infection and should not be used as the sole basis for treatment or other patient management decisions.  A negative result may occur with improper specimen collection / handling, submission of specimen other than nasopharyngeal swab, presence of viral mutation(s) within the areas targeted by this assay, and inadequate number of viral copies (<250 copies / mL). A negative result must be combined with clinical observations, patient history, and epidemiological information.  Fact Sheet for Patients:   StrictlyIdeas.no  Fact Sheet for Healthcare Providers: BankingDealers.co.za  This test is not yet approved or  cleared by the [place] FDA and has been authorized for detection and/or diagnosis of SARS-CoV-2 by FDA under an Emergency Use Authorization (EUA).  This EUA will remain in effect (meaning this test can be used) for the duration of the COVID-19 declaration under Section 564(b)(1) of the Act, 21 U.S.C. section 360bbb-3(b)(1), unless the authorization is terminated or revoked sooner.  Performed at [facility], [place] [address] [place], [place] [address]   Surgical PCR screen     Status: Abnormal   Collection Time: 01/11/20 10:38 PM   Specimen: Nasal Mucosa; Nasal Swab  Result Value Ref Range Status   MRSA, PCR NEGATIVE NEGATIVE Final   Staphylococcus aureus POSITIVE (A) NEGATIVE Final    Comment: (NOTE) The Xpert SA Assay (FDA approved for NASAL specimens in patients 84 years of age and older), is one component of a comprehensive surveillance program. It is not  intended to diagnose infection nor to guide or monitor treatment. Performed at [facility], [place] [address], [place], [place] [address]     Coagulation Studies: No results for input(s): LABPROT, INR in the last 72 hours.  Urinalysis: No results for input(s): COLORURINE, LABSPEC, PHURINE, GLUCOSEU, HGBUR, BILIRUBINUR, KETONESUR, PROTEINUR, UROBILINOGEN, NITRITE, LEUKOCYTESUR in the last 72 hours.  Invalid input(s): APPERANCEUR    Imaging: No results found.   Medications:   . sodium chloride 10 mL/hr (01/16/20 2116)  . heparin 1,200 Units/hr (01/16/20 1331)   . amiodarone  200 mg Oral Daily  . amLODipine  5 mg Oral Daily  . aspirin EC  81 mg Oral Daily  . atorvastatin  10 mg Oral Q M,W,F  . bisacodyl  10 mg Oral Daily  . Chlorhexidine Gluconate Cloth  6 each Topical Q0600  . cholecalciferol  4,000 Units Oral Daily  . darbepoetin (ARANESP) injection - DIALYSIS  60 mcg Intravenous Q Thu-HD  . docusate sodium  100  mg Oral BID  . insulin aspart  0-9 Units Subcutaneous TID WC  . insulin aspart  2 Units Subcutaneous TID WC  . insulin glargine  8 Units Subcutaneous Daily  . lipase/protease/amylase  12,000 Units Oral TID AC  . magnesium chloride  2 tablet Oral BID  . mycophenolate  180 mg Oral BID  . pantoprazole  40 mg Oral Daily  . polyethylene glycol  17 g Oral Daily  . predniSONE  5 mg Oral Q breakfast  . sertraline  50 mg Oral Daily  . tacrolimus  2 mg Oral BID  . tamsulosin  0.4 mg Oral QPC supper   acetaminophen **OR** acetaminophen, hydrALAZINE, ondansetron (ZOFRAN) IV, oxyCODONE, phenylephrine  Assessment/ Plan:  1. Acute kidney injury on CKD4, now progressed to ESRD: BL cr 2.8-3.4 s/p DDRT with DGF complicated by ACR and antibody mediated rejection requiring IVIG as recently as 2018.  Started dialysis 01/04/2020   No sign of renal recovery therefore we will continue TTS schedule for dialysis.  He has outpatient dialysis set up for TTS at [facility]. Patient  has functional dialysis access.  Status post HD yesterday with 2 L UF.  Clinically stable. Plan for next HD on 8/31.  2. Kidney transplant: Continue tacrolimus, mycophenolate, prednisone-tac tapered to 2/2 on 8/18->mycophenolate tapered to 180 mg twice daily on 8/22.  Pharmacy ordering tacrolimus level since patient is on amiodarone, follow-up.  Last tacrolimus level was less than 1 12/31/2019  3. Hypertension/volume: Continue amlodipine.  Managing volume with dialysis.  4. Secondary hyperparathyroidism/hyperphosphatemia: PTH level 43.  Phosphorus level improving, off binders, monitor lab.  5. Anemia of CKD, acute blood loss anemia: Received blood transfusion. Continue Aranesp last dose 01/06/1999. Will order weekly Iron saturation 81 Monitor hemoglobin and transfuse as needed.  6. Severe sepsis due to right leg osteomyelitis/abscess on BKA stump site: Treated with antibiotics.  Status post revision of right below-knee amputation by Dr. Sharol Given on 8/25.  May go to inpatient rehab.   LOS: [place] @TODAY @6 :09 AM

## 2020-01-17 NOTE — Progress Notes (Signed)
Physical Therapy Treatment Patient Details Name: Paul [name] MRN: [id number] DOB: Jul 10, 1955 Today's Date: 01/17/2020    History of Present Illness Patient is a 64 y/o male who presents with sepsis secondary to RLE osteomyelitis and abscess at [place] stump site now s/p right BKA revision 8/25. PMH includes renal transplant, CKD stage IV, calciphylaxis, anemia, A-fib, duodenal adenocarcinoma s/p resection, DM, diabetic neuropathy, s/p bil BKAs, HTN.    PT Comments    Pt wanting to get up and shave today. Pt with improved transfers with assist of elevated but noted to have incontinent bowel and pt reporting he is unsure about urine voiding as well and reports these as new issues. Pt with improved transfer today but requires assist to perform lateral scoot and does not have the stamina currently to stand with single prosthesis and RW. Pt fatigued after 5 arm rest push up and required rest prior to second set. Pt educated for transfers and HEP and continue to recommend benefit of CIR prior to home.     Follow Up Recommendations  CIR;Supervision for mobility/OOB;Supervision/Assistance - 24 hour     Equipment Recommendations  None recommended by PT    Recommendations for Other Services       Precautions / Restrictions Precautions Precautions: Fall Precaution Comments: bil BKAs; wound vac; no pillow under R knee    Mobility  Bed Mobility Overal bed mobility: Needs Assistance Bed Mobility: Supine to Sit;Sidelying to Sit;Sit to Sidelying   Sidelying to sit: Mod assist Supine to sit: Min assist;HOB elevated   Sit to sidelying: Min guard General bed mobility comments: pt able to transition from supine to sit with HOB 30 degrees with only guarding. In sitting with pivot in bed noted large incontinent stool with pt assisted to sidelying for pericare then mod assist to return to sitting from sidelying  Transfers Overall transfer level: Needs assistance   Transfers: Lateral/Scoot  Transfers          Lateral/Scoot Transfers: Min assist General transfer comment: pt able to initiate scooting toward chair with drop arm lateral scoot but required assist of pad and cues to complete transition fully from bed to chair  Ambulation/Gait             General Gait Details: unable   Stairs             Wheelchair Mobility    Modified Rankin (Stroke Patients Only)       Balance   Sitting-balance support: Single extremity supported;No upper extremity supported Sitting balance-Leahy Scale: Poor                                      Cognition Arousal/Alertness: Awake/alert Behavior During Therapy: WFL for tasks assessed/performed Overall Cognitive Status: Impaired/Different from baseline Area of Impairment: Safety/judgement;Memory;Problem solving                     Memory: Decreased short-term memory   Safety/Judgement: Decreased awareness of deficits;Decreased awareness of safety   Problem Solving: Slow processing General Comments: pt unaware of incontinent stool, difficulty with recent events and decreased awareness of current function      Exercises General Exercises - Upper Extremity Chair Push Up: AROM;Both;10 reps;Seated General Exercises - Lower Extremity Long Arc Quad: AROM;Right;Seated;10 reps    General Comments        Pertinent Vitals/Pain Pain Assessment: No/denies pain    Home Living  Prior Function            PT Goals (current goals can now be found in the care plan section) Progress towards PT goals: Progressing toward goals    Frequency    Min 3X/week      PT Plan Current plan remains appropriate    Co-evaluation              AM-PAC PT "6 Clicks" Mobility   Outcome Measure  Help needed turning from your back to your side while in a flat bed without using bedrails?: A Little Help needed moving from lying on your back to sitting on the side of a  flat bed without using bedrails?: A Lot Help needed moving to and from a bed to a chair (including a wheelchair)?: A Little Help needed standing up from a chair using your arms (e.g., wheelchair or bedside chair)?: Total Help needed to walk in hospital room?: Total Help needed climbing 3-5 steps with a railing? : Total 6 Click Score: 11    End of Session   Activity Tolerance: Patient tolerated treatment well Patient left: in chair;with call bell/phone within reach;with chair alarm set Nurse Communication: Mobility status;Need for lift equipment PT Visit Diagnosis: Other abnormalities of gait and mobility (R26.89);Muscle weakness (generalized) (M62.81)     Time: 9233-0076 PT Time Calculation (min) (ACUTE ONLY): 27 min  Charges:  $Therapeutic Exercise: 8-22 mins $Therapeutic Activity: 8-22 mins                     Efrem Pitstick P, PT Acute Rehabilitation Services Pager: [phone] Office: [place] 01/17/2020, 1:23 PM

## 2020-01-17 NOTE — Progress Notes (Signed)
PROGRESS NOTE    Paul [name]  QJF:[id number] DOB: 12/19/1955 DOA: 12/30/2019 PCP: [name], MD    Brief Narrative: HPI on 12/31/2019 by Dr. Ysidro Evert Bairdis a 64 y.o.malewith medical history significant ofkidney transplant recipient(2016)with CKD stage IV,hypertension, IDDM,chronic A. fib on Xarelto, B/L BKAsecondary to PVD, presented with new onset of rightBKA stump pain and rash.Symptoms started about 1 week ago, patient attributed that to a unfit prosthetic which might caused a skin abrasions.He started to notice significant pain rash associated the end of right leg stump about 3 days ago. Pain has been constant and gradually getting worse, denies any fever chills.He took some Tylenol for the pain without any relief.  Patient has been following with [facility] for the kidney transplant, he was seen in the office about 2 weeks ago and multiple blood work and urine analysis sent,urine protein/Cre>5,000,patient however was not informed about results yet. And patient claims his kidney function has been stable,denies any back pain, decreased urine output or urine color.   Assessment & Plan:   Principal Problem:   Sepsis ([place]) Active Problems:   ESRD on hemodialysis ([place])   Anemia   DM (diabetes mellitus) ([place])   S/P BKA (below knee amputation) bilateral ([facility])   AF (paroxysmal atrial fibrillation) ([facility])   AKI (acute kidney injury) ([place])   CKD stage 4 due to type 2 diabetes mellitus ([place])   Cellulitis   Amputation stump infection ([place])   Subacute osteomyelitis of right tibia ([facility])   Dehiscence of amputation stump ([place])   #1 sepsis present on admission secondary to right stump osteomyelitis and abscess status post revision 01/12/2020. On vancomycin and cefepime.  #2 A. fib with RVR patient has chronic atrial fibrillation.  Seen by cardiology started on amiodarone.  Patient was restarted on heparin 01/16/2020.  He has not had any further  bleeding from the stump.  Will restart Eliquis today and watch him closely.    #3 acute metabolic encephalopathy seems to have resolved patient is awake alert oriented x3.  #4 AKI on CKD stage IV with severe metabolic acidosis patient with history of kidney transplant. On tacrolimus prednisone and mycophenolate Followed by nephrology   He was started on dialysis during this hospital admission.  #5 history of essential hypertension 8/80 on amlodipine.  Clonidine is on hold.    #6 type 2 diabetes with hyperglycemia uncontrolled hemoglobin A1c 10.3 patient is on chronic prednisone. CBG (last 3)  Recent Labs    01/16/20 2102 01/17/20 0619 01/17/20 1131  GLUCAP 124* 154* 323*    Continue SSI and lantus Increase the dose of Lantus.  #7 history of pancreatic insufficiency on Creon status post resection of the duodenal adenocarcinoma  #8 hyperlipidemia on statin  #9  Abla/anemia of chronic disease he received 3 units  Of prbc thus far.  H&H stable after blood transfusion   #10 constipation continue MiraLAX and Dulcolax.    Estimated body mass index is 21.23 kg/m as calculated from the following:   Height as of this encounter: 6' (1.829 m).   Weight as of this encounter: 71 kg.  DVT prophylaxis: SCD Heparin stopped due to right stump bleeding  code Status: Full code Family Communication: None at bedside Disposition Plan:  Status is: Inpatient  Dispo:  Patient From: Home  Planned Disposition: Home  Expected discharge date: tbd  Medically stable for discharge: No    Consultants: Ortho, cardiology, nephrology Procedures: None Antimicrobials: Vanco and cefepime  Subjective: He is  resting in bed he has no complaints staff reported he was very constipated and had to disimpact. Objective: Vitals:   01/16/20 2330 01/17/20 0421 01/17/20 0827 01/17/20 1130  BP: (!) 141/67 138/80    Pulse: 60 62    Resp: 16 13    Temp: 97.8 F (36.6 C) (!) 97.5 F (36.4 C) 97.8 F (36.6  C) 98.6 F (37 C)  TempSrc: Oral Oral Oral Oral  SpO2: 99% 98%    Weight:      Height:        Intake/Output Summary (Last 24 hours) at 01/17/2020 1325 Last data filed at 01/17/2020 0830 Gross per 24 hour  Intake 665.5 ml  Output 450 ml  Net 215.5 ml   Filed Weights   01/08/20 1215 01/11/20 1441 01/15/20 1450  Weight: 65.9 kg 70.8 kg 71 kg    Examination:  General exam: Appears calm and comfortable  Respiratory system: Clear to auscultation. Respiratory effort normal. Cardiovascular system: S1 & S2 heard, RRR. No JVD, murmurs, rubs, gallops or clicks. No pedal edema. Gastrointestinal system: Abdomen is nondistended, soft and nontender. No organomegaly or masses felt. Normal bowel sounds heard. Central nervous system: Alert and oriented. No focal neurological deficits. Extremities: Bilateral BKA right stump covered with dressing  skin: No rashes, lesions or ulcers Psychiatry: Judgement and insight appear normal. Mood & affect appropriate.     Data Reviewed: I have personally reviewed following labs and imaging studies  CBC: Recent Labs  Lab 01/13/20 0852 01/14/20 1115 01/15/20 0030 01/16/20 0547 01/17/20 0057  WBC 22.6* 9.7 9.7 9.0 9.5  HGB 7.0* 5.9* 8.3* 8.9* 8.2*  HCT 22.6* 19.6* 25.9* 29.0* 26.8*  MCV 94.6 94.7 91.5 94.5 94.0  PLT 141* 116* 124* 122* 741*   Basic Metabolic Panel: Recent Labs  Lab 01/11/20 0854 01/12/20 0712 01/14/20 1115 01/15/20 1436 01/16/20 0538  NA 132* 137 137 136 141  K 4.6 4.0 4.2 4.8 4.5  CL 103 103 103 104 105  CO2 19* 25 27 25 28   GLUCOSE 293* 195* 195* 209* 255*  BUN 54* 25* 17 36* 19  CREATININE 4.17* 2.48* 2.31* 3.59* 2.22*  CALCIUM 8.3* 8.1* 7.8* 7.9* 8.0*  PHOS 1.8* 1.6* 2.7 3.7 2.9   GFR: Estimated Creatinine Clearance: 32.9 mL/min (A) (by C-G formula based on SCr of 2.22 mg/dL (H)). Liver Function Tests: Recent Labs  Lab 01/11/20 0854 01/12/20 0712 01/14/20 1115 01/15/20 1436 01/16/20 0538  ALBUMIN 1.6*  1.6* 1.8* 1.7* 1.8*   No results for input(s): LIPASE, AMYLASE in the last 168 hours. No results for input(s): AMMONIA in the last 168 hours. Coagulation Profile: No results for input(s): INR, PROTIME in the last 168 hours. Cardiac Enzymes: No results for input(s): CKTOTAL, CKMB, CKMBINDEX, TROPONINI in the last 168 hours. BNP (last 3 results) No results for input(s): PROBNP in the last 8760 hours. HbA1C: No results for input(s): HGBA1C in the last 72 hours. CBG: Recent Labs  Lab 01/16/20 1111 01/16/20 1705 01/16/20 2102 01/17/20 0619 01/17/20 1131  GLUCAP 353* 155* 124* 154* 323*   Lipid Profile: No results for input(s): CHOL, HDL, LDLCALC, TRIG, CHOLHDL, LDLDIRECT in the last 72 hours. Thyroid Function Tests: No results for input(s): TSH, T4TOTAL, FREET4, T3FREE, THYROIDAB in the last 72 hours. Anemia Panel: No results for input(s): VITAMINB12, FOLATE, FERRITIN, TIBC, IRON, RETICCTPCT in the last 72 hours. Sepsis Labs: No results for input(s): PROCALCITON, LATICACIDVEN in the last 168 hours.  Recent Results (from the past 240 hour(s))  Surgical  PCR screen     Status: Abnormal   Collection Time: 01/11/20 10:38 PM   Specimen: Nasal Mucosa; Nasal Swab  Result Value Ref Range Status   MRSA, PCR NEGATIVE NEGATIVE Final   Staphylococcus aureus POSITIVE (A) NEGATIVE Final    Comment: (NOTE) The Xpert SA Assay (FDA approved for NASAL specimens in patients 43 years of age and older), is one component of a comprehensive surveillance program. It is not intended to diagnose infection nor to guide or monitor treatment. Performed at [facility], [place] [address] [place], [place] [address]          Radiology Studies: No results found.      Scheduled Meds: . amiodarone  200 mg Oral Daily  . amLODipine  5 mg Oral Daily  . aspirin EC  81 mg Oral Daily  . atorvastatin  10 mg Oral Q M,W,F  . bisacodyl  10 mg Oral Daily  . Chlorhexidine Gluconate Cloth  6 each  Topical Q0600  . Chlorhexidine Gluconate Cloth  6 each Topical Q0600  . cholecalciferol  4,000 Units Oral Daily  . darbepoetin (ARANESP) injection - DIALYSIS  60 mcg Intravenous Q Thu-HD  . docusate sodium  100 mg Oral BID  . insulin aspart  0-9 Units Subcutaneous TID WC  . insulin aspart  2 Units Subcutaneous TID WC  . insulin glargine  8 Units Subcutaneous Daily  . lipase/protease/amylase  12,000 Units Oral TID AC  . magnesium chloride  2 tablet Oral BID  . mycophenolate  180 mg Oral BID  . pantoprazole  40 mg Oral Daily  . polyethylene glycol  17 g Oral Daily  . predniSONE  5 mg Oral Q breakfast  . sertraline  50 mg Oral Daily  . tacrolimus  2 mg Oral BID  . tamsulosin  0.4 mg Oral QPC supper   Continuous Infusions: . sodium chloride 10 mL/hr (01/16/20 2116)  . heparin 1,200 Units/hr (01/17/20 1036)     LOS: 17 days     Georgette Shell, MD  01/17/2020, 1:25 PM

## 2020-01-18 ENCOUNTER — Inpatient Hospital Stay (HOSPITAL_COMMUNITY)
Admission: RE | Admit: 2020-01-18 | Discharge: 2020-01-31 | DRG: 559 | Disposition: A | Discharge status: Home Health | Payer: Medicare Other | Source: Intra-hospital | Attending: Physical Medicine & Rehabilitation | Admitting: Physical Medicine & Rehabilitation

## 2020-01-18 DIAGNOSIS — N179 Acute kidney failure, unspecified: Secondary | ICD-10-CM | POA: Diagnosis present

## 2020-01-18 DIAGNOSIS — E785 Hyperlipidemia, unspecified: Secondary | ICD-10-CM | POA: Diagnosis present

## 2020-01-18 DIAGNOSIS — Z8042 Family history of malignant neoplasm of prostate: Secondary | ICD-10-CM

## 2020-01-18 DIAGNOSIS — Z4781 Encounter for orthopedic aftercare following surgical amputation: Secondary | ICD-10-CM | POA: Diagnosis present

## 2020-01-18 DIAGNOSIS — E1022 Type 1 diabetes mellitus with diabetic chronic kidney disease: Secondary | ICD-10-CM | POA: Diagnosis present

## 2020-01-18 DIAGNOSIS — Z89511 Acquired absence of right leg below knee: Secondary | ICD-10-CM | POA: Diagnosis not present

## 2020-01-18 DIAGNOSIS — E1042 Type 1 diabetes mellitus with diabetic polyneuropathy: Secondary | ICD-10-CM | POA: Diagnosis present

## 2020-01-18 DIAGNOSIS — Z7952 Long term (current) use of systemic steroids: Secondary | ICD-10-CM

## 2020-01-18 DIAGNOSIS — Z89512 Acquired absence of left leg below knee: Secondary | ICD-10-CM | POA: Diagnosis not present

## 2020-01-18 DIAGNOSIS — I120 Hypertensive chronic kidney disease with stage 5 chronic kidney disease or end stage renal disease: Secondary | ICD-10-CM | POA: Diagnosis present

## 2020-01-18 DIAGNOSIS — D631 Anemia in chronic kidney disease: Secondary | ICD-10-CM | POA: Diagnosis present

## 2020-01-18 DIAGNOSIS — E1142 Type 2 diabetes mellitus with diabetic polyneuropathy: Secondary | ICD-10-CM

## 2020-01-18 DIAGNOSIS — D179 Benign lipomatous neoplasm, unspecified: Secondary | ICD-10-CM | POA: Diagnosis present

## 2020-01-18 DIAGNOSIS — Z7901 Long term (current) use of anticoagulants: Secondary | ICD-10-CM

## 2020-01-18 DIAGNOSIS — I480 Paroxysmal atrial fibrillation: Secondary | ICD-10-CM | POA: Diagnosis not present

## 2020-01-18 DIAGNOSIS — R7309 Other abnormal glucose: Secondary | ICD-10-CM

## 2020-01-18 DIAGNOSIS — K219 Gastro-esophageal reflux disease without esophagitis: Secondary | ICD-10-CM | POA: Diagnosis present

## 2020-01-18 DIAGNOSIS — T8612 Kidney transplant failure: Secondary | ICD-10-CM | POA: Diagnosis present

## 2020-01-18 DIAGNOSIS — D638 Anemia in other chronic diseases classified elsewhere: Secondary | ICD-10-CM | POA: Diagnosis not present

## 2020-01-18 DIAGNOSIS — Z888 Allergy status to other drugs, medicaments and biological substances status: Secondary | ICD-10-CM | POA: Diagnosis not present

## 2020-01-18 DIAGNOSIS — N2581 Secondary hyperparathyroidism of renal origin: Secondary | ICD-10-CM | POA: Diagnosis present

## 2020-01-18 DIAGNOSIS — Z7982 Long term (current) use of aspirin: Secondary | ICD-10-CM

## 2020-01-18 DIAGNOSIS — L8992 Pressure ulcer of unspecified site, stage 2: Secondary | ICD-10-CM | POA: Diagnosis present

## 2020-01-18 DIAGNOSIS — Z8249 Family history of ischemic heart disease and other diseases of the circulatory system: Secondary | ICD-10-CM

## 2020-01-18 DIAGNOSIS — I4891 Unspecified atrial fibrillation: Secondary | ICD-10-CM | POA: Diagnosis present

## 2020-01-18 DIAGNOSIS — L89152 Pressure ulcer of sacral region, stage 2: Secondary | ICD-10-CM | POA: Diagnosis present

## 2020-01-18 DIAGNOSIS — Z794 Long term (current) use of insulin: Secondary | ICD-10-CM

## 2020-01-18 DIAGNOSIS — D62 Acute posthemorrhagic anemia: Secondary | ICD-10-CM | POA: Diagnosis present

## 2020-01-18 DIAGNOSIS — N186 End stage renal disease: Secondary | ICD-10-CM | POA: Diagnosis present

## 2020-01-18 DIAGNOSIS — Z823 Family history of stroke: Secondary | ICD-10-CM

## 2020-01-18 DIAGNOSIS — Z992 Dependence on renal dialysis: Secondary | ICD-10-CM

## 2020-01-18 DIAGNOSIS — Z79899 Other long term (current) drug therapy: Secondary | ICD-10-CM

## 2020-01-18 LAB — RENAL FUNCTION PANEL
Albumin: 2.1 g/dL — ABNORMAL LOW (ref 3.5–5.0)
Anion gap: 10 (ref 5–15)
BUN: 47 mg/dL — ABNORMAL HIGH (ref 8–23)
CO2: 23 mmol/L (ref 22–32)
Calcium: 8.4 mg/dL — ABNORMAL LOW (ref 8.9–10.3)
Chloride: 103 mmol/L (ref 98–111)
Creatinine, Ser: 4.14 mg/dL — ABNORMAL HIGH (ref 0.61–1.24)
GFR calc Af Amer: 16 mL/min — ABNORMAL LOW (ref >60)
GFR calc non Af Amer: 14 mL/min — ABNORMAL LOW (ref >60)
Glucose, Bld: 169 mg/dL — ABNORMAL HIGH (ref 70–99)
Phosphorus: 4.3 mg/dL (ref 2.5–4.6)
Potassium: 5.1 mmol/L (ref 3.5–5.1)
Sodium: 136 mmol/L (ref 135–145)

## 2020-01-18 LAB — CBC
HCT: 31.8 % — ABNORMAL LOW (ref 39.0–52.0)
Hemoglobin: 9.9 g/dL — ABNORMAL LOW (ref 13.0–17.0)
MCH: 28.8 pg (ref 26.0–34.0)
MCHC: 31.1 g/dL (ref 30.0–36.0)
MCV: 92.4 fL (ref 80.0–100.0)
Platelets: 144 10*3/uL — ABNORMAL LOW (ref 150–400)
RBC: 3.44 MIL/uL — ABNORMAL LOW (ref 4.22–5.81)
RDW: 15.3 % (ref 11.5–15.5)
WBC: 9.5 10*3/uL (ref 4.0–10.5)
nRBC: 0 % (ref 0.0–0.2)

## 2020-01-18 LAB — GLUCOSE, CAPILLARY
Glucose-Capillary: 159 mg/dL — ABNORMAL HIGH (ref 70–99)
Glucose-Capillary: 131 mg/dL — ABNORMAL HIGH (ref 70–99)

## 2020-01-18 MED ORDER — DARBEPOETIN ALFA 60 MCG/0.3ML IJ SOSY
60.0000 ug | PREFILLED_SYRINGE | INTRAMUSCULAR | Status: DC
  Filled 2020-01-18 (×2): qty 0.3

## 2020-01-18 MED ORDER — PANTOPRAZOLE SODIUM 40 MG PO TBEC
40.0000 mg | DELAYED_RELEASE_TABLET | Freq: Every day | ORAL | Status: DC
  Administered 2020-01-19 – 2020-01-31 (×13): 40 mg via ORAL
  Filled 2020-01-18 (×13): qty 1

## 2020-01-18 MED ORDER — PANCRELIPASE (LIP-PROT-AMYL) 12000-38000 UNITS PO CPEP
12000.0000 [IU] | ORAL_CAPSULE | Freq: Three times a day (TID) | ORAL | Status: DC
  Administered 2020-01-19 – 2020-01-31 (×37): 12000 [IU] via ORAL
  Filled 2020-01-18 (×42): qty 1

## 2020-01-18 MED ORDER — TAMSULOSIN HCL 0.4 MG PO CAPS
0.4000 mg | ORAL_CAPSULE | Freq: Every day | ORAL | Status: DC
  Administered 2020-01-19 – 2020-01-30 (×12): 0.4 mg via ORAL
  Filled 2020-01-18 (×12): qty 1

## 2020-01-18 MED ORDER — POLYETHYLENE GLYCOL 3350 17 G PO PACK
17.0000 g | PACK | Freq: Every day | ORAL | Status: DC
  Administered 2020-01-19 – 2020-01-24 (×3): 17 g via ORAL
  Filled 2020-01-18 (×8): qty 1

## 2020-01-18 MED ORDER — TACROLIMUS 1 MG PO CAPS
2.0000 mg | ORAL_CAPSULE | Freq: Two times a day (BID) | ORAL | Status: DC
  Administered 2020-01-18 – 2020-01-31 (×26): 2 mg via ORAL
  Filled 2020-01-18 (×27): qty 2

## 2020-01-18 MED ORDER — ACETAMINOPHEN 325 MG PO TABS
650.0000 mg | ORAL_TABLET | Freq: Four times a day (QID) | ORAL | Status: DC | PRN
  Administered 2020-01-20: 650 mg via ORAL
  Filled 2020-01-18: qty 2

## 2020-01-18 MED ORDER — BISACODYL 5 MG PO TBEC
10.0000 mg | DELAYED_RELEASE_TABLET | Freq: Every day | ORAL | Status: DC
  Administered 2020-01-19 – 2020-01-29 (×5): 10 mg via ORAL
  Filled 2020-01-18 (×9): qty 2

## 2020-01-18 MED ORDER — AMIODARONE HCL 200 MG PO TABS
200.0000 mg | ORAL_TABLET | Freq: Every day | ORAL | 0 refills | Status: DC
Start: 2020-01-19 — End: 2020-01-31

## 2020-01-18 MED ORDER — DOCUSATE SODIUM 100 MG PO CAPS
100.0000 mg | ORAL_CAPSULE | Freq: Two times a day (BID) | ORAL | Status: DC
  Administered 2020-01-18 – 2020-01-19 (×3): 100 mg via ORAL
  Filled 2020-01-18 (×4): qty 1

## 2020-01-18 MED ORDER — PREDNISONE 5 MG PO TABS
5.0000 mg | ORAL_TABLET | Freq: Every day | ORAL | Status: DC
  Administered 2020-01-19 – 2020-01-31 (×13): 5 mg via ORAL
  Filled 2020-01-18 (×13): qty 1

## 2020-01-18 MED ORDER — MAGNESIUM CHLORIDE 64 MG PO TBEC
2.0000 | DELAYED_RELEASE_TABLET | Freq: Two times a day (BID) | ORAL | Status: DC
  Administered 2020-01-19 – 2020-01-31 (×26): 128 mg via ORAL
  Filled 2020-01-18 (×28): qty 2

## 2020-01-18 MED ORDER — PANCRELIPASE (LIP-PROT-AMYL) 12000-38000 UNITS PO CPEP: 12000.0000 [IU] | ORAL_CAPSULE | Freq: Three times a day (TID) | ORAL | Status: DC

## 2020-01-18 MED ORDER — SERTRALINE HCL 50 MG PO TABS
50.0000 mg | ORAL_TABLET | Freq: Every day | ORAL | Status: DC
  Administered 2020-01-19 – 2020-01-31 (×13): 50 mg via ORAL
  Filled 2020-01-18 (×13): qty 1

## 2020-01-18 MED ORDER — MYCOPHENOLATE SODIUM 180 MG PO TBEC
180.0000 mg | DELAYED_RELEASE_TABLET | Freq: Two times a day (BID) | ORAL | Status: DC
  Administered 2020-01-18 – 2020-01-31 (×26): 180 mg via ORAL
  Filled 2020-01-18 (×27): qty 1

## 2020-01-18 MED ORDER — APIXABAN 5 MG PO TABS
5.0000 mg | ORAL_TABLET | Freq: Two times a day (BID) | ORAL | Status: DC
  Administered 2020-01-18 – 2020-01-31 (×26): 5 mg via ORAL
  Filled 2020-01-18 (×26): qty 1

## 2020-01-18 MED ORDER — DOCUSATE SODIUM 100 MG PO CAPS: 100.0000 mg | ORAL_CAPSULE | Freq: Two times a day (BID) | ORAL | 0 refills | Status: DC

## 2020-01-18 MED ORDER — ACETAMINOPHEN 650 MG RE SUPP: 650.0000 mg | Freq: Four times a day (QID) | RECTAL | Status: DC | PRN

## 2020-01-18 MED ORDER — BISACODYL 5 MG PO TBEC
10.0000 mg | DELAYED_RELEASE_TABLET | Freq: Every day | ORAL | 0 refills | Status: DC
Start: 2020-01-19 — End: 2020-07-20

## 2020-01-18 MED ORDER — VITAMIN D 25 MCG (1000 UNIT) PO TABS
4000.0000 [IU] | ORAL_TABLET | Freq: Every day | ORAL | Status: DC
  Administered 2020-01-19 – 2020-01-31 (×13): 4000 [IU] via ORAL
  Filled 2020-01-18 (×13): qty 4

## 2020-01-18 MED ORDER — POLYETHYLENE GLYCOL 3350 17 G PO PACK: 17.0000 g | PACK | Freq: Every day | ORAL | 0 refills | Status: DC

## 2020-01-18 MED ORDER — HYDRALAZINE HCL 25 MG PO TABS: 25.0000 mg | ORAL_TABLET | Freq: Four times a day (QID) | ORAL | Status: DC | PRN

## 2020-01-18 MED ORDER — OXYCODONE HCL 5 MG PO TABS
5.0000 mg | ORAL_TABLET | ORAL | Status: DC | PRN
  Administered 2020-01-18: 5 mg via ORAL
  Administered 2020-01-20 – 2020-01-25 (×4): 10 mg via ORAL
  Filled 2020-01-18: qty 2
  Filled 2020-01-18: qty 1
  Filled 2020-01-18 (×4): qty 2

## 2020-01-18 MED ORDER — ACETAMINOPHEN 325 MG PO TABS: 650.0000 mg | ORAL_TABLET | Freq: Four times a day (QID) | ORAL | Status: AC | PRN

## 2020-01-18 MED ORDER — INSULIN GLARGINE 100 UNIT/ML ~~LOC~~ SOLN
8.0000 [IU] | Freq: Every day | SUBCUTANEOUS | Status: DC
  Administered 2020-01-19 – 2020-01-31 (×13): 8 [IU] via SUBCUTANEOUS
  Filled 2020-01-18 (×13): qty 0.08

## 2020-01-18 MED ORDER — AMLODIPINE BESYLATE 5 MG PO TABS
5.0000 mg | ORAL_TABLET | Freq: Every day | ORAL | Status: DC
  Administered 2020-01-19 – 2020-01-26 (×6): 5 mg via ORAL
  Filled 2020-01-18 (×9): qty 1

## 2020-01-18 MED ORDER — INSULIN ASPART 100 UNIT/ML ~~LOC~~ SOLN
2.0000 [IU] | Freq: Three times a day (TID) | SUBCUTANEOUS | Status: DC
  Administered 2020-01-19 – 2020-01-20 (×4): 2 [IU] via SUBCUTANEOUS

## 2020-01-18 MED ORDER — INSULIN ASPART 100 UNIT/ML ~~LOC~~ SOLN
0.0000 [IU] | Freq: Three times a day (TID) | SUBCUTANEOUS | Status: DC
  Administered 2020-01-19: 1 [IU] via SUBCUTANEOUS
  Administered 2020-01-19: 5 [IU] via SUBCUTANEOUS
  Administered 2020-01-21 – 2020-01-22 (×2): 2 [IU] via SUBCUTANEOUS
  Administered 2020-01-23: 3 [IU] via SUBCUTANEOUS
  Administered 2020-01-23 (×2): 1 [IU] via SUBCUTANEOUS
  Administered 2020-01-24: 2 [IU] via SUBCUTANEOUS
  Administered 2020-01-24: 3 [IU] via SUBCUTANEOUS
  Administered 2020-01-24 – 2020-01-27 (×6): 1 [IU] via SUBCUTANEOUS
  Administered 2020-01-28: 2 [IU] via SUBCUTANEOUS
  Administered 2020-01-28 – 2020-01-29 (×2): 3 [IU] via SUBCUTANEOUS
  Administered 2020-01-29: 5 [IU] via SUBCUTANEOUS
  Administered 2020-01-30: 1 [IU] via SUBCUTANEOUS

## 2020-01-18 MED ORDER — ATORVASTATIN CALCIUM 10 MG PO TABS
10.0000 mg | ORAL_TABLET | ORAL | Status: DC
  Administered 2020-01-19 – 2020-01-31 (×6): 10 mg via ORAL
  Filled 2020-01-18 (×8): qty 1

## 2020-01-18 MED ORDER — AMIODARONE HCL 200 MG PO TABS
200.0000 mg | ORAL_TABLET | Freq: Every day | ORAL | Status: DC
  Administered 2020-01-19 – 2020-01-31 (×13): 200 mg via ORAL
  Filled 2020-01-18 (×13): qty 1

## 2020-01-18 MED ORDER — ASPIRIN EC 81 MG PO TBEC
81.0000 mg | DELAYED_RELEASE_TABLET | Freq: Every day | ORAL | Status: DC
  Administered 2020-01-19 – 2020-01-31 (×13): 81 mg via ORAL
  Filled 2020-01-18 (×13): qty 1

## 2020-01-18 NOTE — H&P (Signed)
Physical Medicine and Rehabilitation Admission H&P    Chief Complaint  Patient presents with  . Cellulitis  : HPI: Paul [name] is a 64 year old right-handed male with history of renal transplant 2016 with baseline creatinine 2.8-3.4 maintained on chronic prednisone, Prograf as well as Myfortic, hypertension, insulin-dependent diabetes mellitus, chronic atrial fibrillation on Eliquis, bilateral below-knee amputations receiving inpatient rehab services 08/03/2013 to 08/15/2013 as well as CIR 05/31/2013 to 06/11/2013 for multifactorial gait disorder related to peripheral neuropathy.  Patient lives with spouse.  Two-level home bed and bath main level with ramped entrance.  Independent with assistive device and prosthesis provided by [employer].  Presented 12/30/2019 with nonspecific pain and rash at the end of right leg stump 3 days prior.  Admission chemistries glucose 269 BUN 80 creatinine 4.76, hemoglobin 10.4 WBC 13,300, lactic acid 0.5, SARS coronavirus negative, urinalysis negative nitrite, CK 56.  CT of right lower extremity showed swelling edema at the distal stump with possible cutaneous blister.  There was more focal hyperdense collection at the posterior lateral aspect of the distal stump measuring approximately 2.6 x 1.7 x 2.6 cm possibly representing hematoma.  MRI of right lower extremity showed no findings of osteomyelitis no drainable abscess.  Renal ultrasound showed no abscess or hydronephrosis.  Placed on vancomycin and cefepime for questionable early cellulitis.  Hospital course bouts of A. fib RVR 120s to 130 blood pressures 90s/60s received fluid bolus follow-up cardiology services and placed on IV amiodarone transition to p.o. as well as digoxin.  Follow-up renal services for progressive CKD stage IV necessitating need for dialysis initiated 01/04/2020 and arrangements have been made for ongoing outpatient dialysis on discharge with latest creatinine 4.14.  Follow-up Dr. Lajoyce Corners for  cellulitis suspect osteomyelitis right below-knee amputation had no change with conservative care and underwent revision right BKA 01/12/2020 and initial placement of wound VAC that was later discontinued.  Patient remains on Eliquis as prior to admission.  Acute on chronic anemia 9.9 and monitored.  Therapy evaluations completed and patient was admitted for a comprehensive rehab program.  Pt reports he's oliguric, not anuric- LBM this AM- not constipated.  Voided x1 since foley removed this AM- in urinal.  No dysuria.  Pain in R BKA- also has wound on tailbone that's painful.     Review of Systems  Constitutional: Positive for malaise/fatigue. Negative for chills and fever.  HENT: Negative for hearing loss.   Eyes: Negative for blurred vision and double vision.  Respiratory: Negative for cough and shortness of breath.   Cardiovascular: Positive for palpitations. Negative for chest pain.  Gastrointestinal: Negative for constipation, heartburn, nausea and vomiting.  Genitourinary: Negative for dysuria, flank pain and hematuria.  Musculoskeletal: Positive for joint pain and myalgias.  Skin: Negative for rash.  Psychiatric/Behavioral: Positive for depression.  All other systems reviewed and are negative.  Past Medical History:  Diagnosis Date  . A-fib (HCC)   . Anemia    esrd  . Calciphylaxis 05/2013   lower ext  . Cancer (HCC)    hx duodenal adenoCa 2002, resected  . Closed left arm fracture 1967  . Corneal abrasion, left   . Depression   . Diabetes mellitus    Type 1 iddm x 11 yrs  . Diabetic neuropathy (HCC)   . ESRD on hemodialysis (HCC)    Pt has ESRD due to DM.  He had a L upper arm AVF prior to starting HD which was ligated due to L arm steal syndrome.  He  started HD in Jan 2014 and did home HD.  The family couldn't cannulate the R arm AVF successfully so by mid 2014 they decided to switch to PD which was done late summer 2014.  In Jan 2015 he was admitted with FTT felt to be  due to underdialysis on PD and PD was abandoned and he   . GERD (gastroesophageal reflux disease)   . Glomerulosclerosis, diabetic ([place])   . Heart murmur   . History of blood transfusion   . Hypertension    off of meds due to orthostatic hypotension  . Kidney transplant recipient   . Open wound of both legs with complication    Pt has had progressive wounds of both LE's including gangrene of the toes and patchy necrosis of the calves.  He has been treated at [facility] by Dr Jerline Pain with hyperbaric O2 5d per week.  He is getting Na Thiosulfate with HD for suspected calciphylaxis. Pt says doctor's aren't sure if these ulcers were diabetic ulcers or calciphylaxis.  He underwent bilat BKA on 07/30/13.  He says that the woun   Past Surgical History:  Procedure Laterality Date  . AMPUTATION Bilateral 07/30/2013   Procedure: AMPUTATION BELOW KNEE;  Surgeon: Newt Minion, MD;  Location: [place];  Service: Orthopedics;  Laterality: Bilateral;  Bilateral Below Knee Amputations  . AMPUTATION Bilateral 09/03/2013   Procedure: AMPUTATION BELOW KNEE;  Surgeon: Newt Minion, MD;  Location: [place];  Service: Orthopedics;  Laterality: Bilateral;  Bilateral Below Knee Amputation Revision  . AV FISTULA PLACEMENT  06/07/2011   Procedure: ARTERIOVENOUS (AV) FISTULA CREATION;  Surgeon: Angelia Mould, MD;  Location: [place];  Service: Vascular;  Laterality: Left;  . AV FISTULA PLACEMENT  06/18/2012   Procedure: ARTERIOVENOUS (AV) FISTULA CREATION;  Surgeon: Mal Misty, MD;  Location: [place];  Service: Vascular;  Laterality: Right;  [place]  . BILIARY DIVERSION, EXTERNAL  2002  . BONE MARROW BIOPSY  2012  . CAPD INSERTION N/A 01/14/2013   Procedure: LAPAROSCOPIC INSERTION CONTINUOUS AMBULATORY PERITONEAL DIALYSIS  (CAPD) CATHETER;  Surgeon: Adin Hector, MD;  Location: [place];  Service: General;  Laterality: N/A;  . CAPD REMOVAL N/A 07/09/2013   Procedure: CONTINUOUS AMBULATORY PERITONEAL DIALYSIS  (CAPD)  CATHETER REMOVAL;  Surgeon: Adin Hector, MD;  Location: [place];  Service: General;  Laterality: N/A;  . COLONOSCOPY     Hx: of  . INSERTION OF DIALYSIS CATHETER  05/22/2012   Procedure: INSERTION OF DIALYSIS CATHETER;  Surgeon: Mal Misty, MD;  Location: [place];  Service: Vascular;  Laterality: N/A;  right internal jugular vein  . IR ANGIO INTRA EXTRACRAN SEL COM CAROTID INNOMINATE BILAT MOD SED  10/28/2018  . IR ANGIO VERTEBRAL SEL SUBCLAVIAN INNOMINATE UNI R MOD SED  10/28/2018  . IR ANGIO VERTEBRAL SEL VERTEBRAL UNI L MOD SED  10/28/2018  . LIGATION OF ARTERIOVENOUS  FISTULA  05/22/2012   Procedure: LIGATION OF ARTERIOVENOUS  FISTULA;  Surgeon: Mal Misty, MD;  Location: [place];  Service: Vascular;  Laterality: Left;  Left brachio-cephalic fistula  . LOWER EXTREMITY ANGIOGRAM N/A 06/14/2013   Procedure: LOWER EXTREMITY ANGIOGRAM;  Surgeon: Angelia Mould, MD;  Location: [facility] C[facility];  Service: Cardiovascular;  Laterality: N/A;  . NEPHRECTOMY TRANSPLANTED ORGAN    . OTHER SURGICAL HISTORY     Cyst removed from back  . PORTACATH PLACEMENT  2002  . RADIOLOGY WITH ANESTHESIA N/A 10/28/2018   Procedure: IR WITH ANESTHESIA;  Surgeon: Radiologist, Medication, MD;  Location: [place];  Service: Radiology;  Laterality: N/A;  . REMOVAL OF A DIALYSIS CATHETER    . RENAL BIOPSY, PERCUTANEOUS  2012  . STUMP REVISION Right 01/12/2020   Procedure: REVISION RIGHT BELOW KNEE AMPUTATION;  Surgeon: Newt Minion, MD;  Location: [place];  Service: Orthopedics;  Laterality: Right;  . TONSILLECTOMY    . VASCULAR SURGERY    . WHIPPLE PROCEDURE  2002   duodenal ca, Dr. Harlow Asa   Family History  Problem Relation Age of Onset  . Cancer Mother   . Cancer Father        prostate cancer  . Heart disease Maternal Grandfather   . Stroke Paternal Grandmother    Social History:  reports that he has never smoked. He has never used smokeless tobacco. He reports that he does not drink alcohol and does not use  drugs. Allergies:  Allergies  Allergen Reactions  . Metformin And Related Other (See Comments)    Has kidney problems   Medications Prior to Admission  Medication Sig Dispense Refill  . acetaminophen (TYLENOL) 500 MG tablet Take 500 mg by mouth every 6 (six) hours as needed for mild pain or moderate pain.    [name] amLODipine (NORVASC) 10 MG tablet Take 10 mg by mouth daily.    [name] aspirin 81 MG chewable tablet Chew 81 mg by mouth daily.    [name] atorvastatin (LIPITOR) 10 MG tablet Take 1 tablet (10 mg total) by mouth daily. (Patient taking differently: Take 10 mg by mouth every Monday, Wednesday, and Friday. ) 30 tablet 1  . calcitRIOL (ROCALTROL) 0.25 MCG capsule Take 0.25 mcg by mouth daily.     . calcium acetate (PHOSLO) 667 MG capsule Take 1,334 mg by mouth 3 (three) times daily with meals.    . CVS D3 50 MCG (2000 UT) CAPS Take 4,000 Units by mouth daily.    [name] ELIQUIS 5 MG TABS tablet TAKE 1 TABLET BY MOUTH TWICE A DAY (Patient taking differently: Take 5 mg by mouth 2 (two) times daily. ) 60 tablet 4  . feeding supplement, GLUCERNA SHAKE, (GLUCERNA SHAKE) LIQD Take 237 mLs by mouth 3 (three) times daily between meals.    [name] HUMALOG KWIKPEN 100 UNIT/ML KwikPen Inject 4-8 Units into the skin daily. Sliding Scale    . hydrALAZINE (APRESOLINE) 100 MG tablet Take 100 mg by mouth 2 (two) times daily. Afternoon, bedtime    . Insulin Glargine (BASAGLAR KWIKPEN) 100 UNIT/ML SOPN Inject 10 Units into the skin daily.     . magnesium chloride (SLOW-MAG) 64 MG TBEC SR tablet Take 2 tablets by mouth 2 (two) times daily. Take at least 2 hours separate from tacrolimus.    . mycophenolate (MYFORTIC) 180 MG EC tablet Take 360 mg by mouth 2 (two) times daily.     [name] omeprazole (PRILOSEC) 40 MG capsule Take 40 mg by mouth at bedtime.     . predniSONE (DELTASONE) 5 MG tablet Take 5 mg by mouth daily with breakfast.     . sertraline (ZOLOFT) 50 MG tablet Take 50 mg by mouth at bedtime.     . sodium bicarbonate 650 MG  tablet Take 1,950 mg by mouth 4 (four) times daily.    . tacrolimus (PROGRAF) 1 MG capsule Take 2-3 mg by mouth See admin instructions. Take 3 mg by mouth in the morning at 9 AM and 2 mg in the evening at 9 PM    . tamsulosin (FLOMAX)  0.4 MG CAPS capsule Take 0.4 mg by mouth daily after supper.       Drug Regimen Review Drug regimen was reviewed and remains appropriate with no significant issues identified  Home: Home Living Family/patient expects to be discharged to:: Private residence Living Arrangements: Spouse/significant other Available Help at Discharge: Family, Available PRN/intermittently Type of Home: House Home Access: Ramped entrance Home Layout: Two level, Able to live on main level with bedroom/bathroom Alternate Level Stairs-Number of Steps: 1 step down into the living room Alternate Level Stairs-Rails: None Bathroom Shower/Tub: Multimedia programmer: Standard Bathroom Accessibility: Yes (toilet is not accessible) Home Equipment: [place] - single point, Shower seat, Wheelchair - manual, Crutches, Environmental consultant - 2 wheels, Hand held shower head, Grab bars - toilet, Shower seat - built in   Functional History: Prior Function Level of Independence: Independent with assistive device(s) Comments: Uses RW more recently and SPC prior to a few weeks ago.  Functional Status:  Mobility: Bed Mobility Overal bed mobility: Needs Assistance Bed Mobility: Supine to Sit, Sidelying to Sit, Sit to Sidelying Rolling: Supervision (heavy use of rails) Sidelying to sit: Mod assist Supine to sit: Min assist, HOB elevated Sit to sidelying: Min guard General bed mobility comments: pt able to transition from supine to sit with HOB 30 degrees with only guarding. In sitting with pivot in bed noted large incontinent stool with pt assisted to sidelying for pericare then mod assist to return to sitting from sidelying Transfers Overall transfer level: Needs assistance Transfers: Lateral/Scoot  Transfers  Lateral/Scoot Transfers: Min assist General transfer comment: pt able to initiate scooting toward chair with drop arm lateral scoot but required assist of pad and cues to complete transition fully from bed to chair Ambulation/Gait General Gait Details: unable    ADL: ADL Overall ADL's : Needs assistance/impaired Grooming: Set up, Sitting Upper Body Bathing: Set up, Sitting, Bed level Lower Body Bathing: Moderate assistance, Bed level Upper Body Dressing : Minimal assistance, Bed level Lower Body Dressing: Moderate assistance, Bed level Toileting- Clothing Manipulation and Hygiene: Moderate assistance, Bed level Functional mobility during ADLs:  (declined OOB) General ADL Comments: limited today by fatigue/low Hgb  Cognition: Cognition Overall Cognitive Status: Impaired/Different from baseline Orientation Level: Oriented X4 Cognition Arousal/Alertness: Awake/alert Behavior During Therapy: WFL for tasks assessed/performed Overall Cognitive Status: Impaired/Different from baseline Area of Impairment: Safety/judgement, Memory, Problem solving Current Attention Level: Selective Memory: Decreased short-term memory Safety/Judgement: Decreased awareness of deficits, Decreased awareness of safety Problem Solving: Slow processing General Comments: pt unaware of incontinent stool, difficulty with recent events and decreased awareness of current function  Physical Exam: Blood pressure (!) 154/66, pulse 64, temperature 98.4 F (36.9 C), temperature source Oral, resp. rate 14, height 6' (1.829 m), weight 71 kg, SpO2 98 %. Physical Exam Vitals and nursing note reviewed. Exam conducted with a chaperone present.  Constitutional:      Comments: Supine in bed- on backside; appropriate, alert, wife at bedside, NAD  HENT:     Head: Normocephalic and atraumatic.     Comments: Smile equal    Right Ear: External ear normal.     Left Ear: External ear normal.     Nose: Nose normal. No  congestion.     Mouth/Throat:     Mouth: Mucous membranes are dry.     Pharynx: Oropharynx is clear. No oropharyngeal exudate.  Eyes:     General:        Right eye: No discharge.  Left eye: No discharge.     Extraocular Movements: Extraocular movements intact.  Cardiovascular:     Comments: RRR- no M/R/G Pulmonary:     Comments: CTA B/L- no W/R/R- good air movement  Abdominal:     Comments: Soft, NT< ND, (+)BS  Musculoskeletal:     Cervical back: Normal range of motion and neck supple.     Comments: R BKA wrapped in ACE wrap- asked me not to unwrap since very painful and wants pain meds to kick in before checks it UEs 5/5 in deltoid, biceps, triceps, WE, grip B/L and finger abd 4-/5 B/L- tested twice LEs- lacking 20-3 degrees of Full knee extension on R- not propping with pillow correctly- advised him how to do LLE HF/KE/KF 5-/5- at least 3/5 on RLE with BKA but very painful to do ROM  Skin:    Comments: Right BKA revision wrapped dressing in place appropriately tender.  Left BKA well-healed  Stage II on sacrum oval/elliptoid shape - ~ 2 to 2.5 inches in diameter at biggest- top layer of skin off- Is Stage II.  IV in L Forearm and L wrist- R forearm has fistula- thrill palpable  Neurological:     Comments: Patient is alert no acute distress.  Oriented x3 and follows commands.  Ox3- appropriate, intact sensation to light touch in UEs and to knees B/L      Results for orders placed or performed during the hospital encounter of 12/30/19 (from the past 48 hour(s))  Glucose, capillary     Status: Abnormal   Collection Time: 01/16/20 11:11 AM  Result Value Ref Range   Glucose-Capillary 353 (H) 70 - 99 mg/dL    Comment: Glucose reference range applies only to samples taken after fasting for at least 8 hours.   Comment 1 Notify RN    Comment 2 Document in Chart   Glucose, capillary     Status: Abnormal   Collection Time: 01/16/20  5:05 PM  Result Value Ref Range    Glucose-Capillary 155 (H) 70 - 99 mg/dL    Comment: Glucose reference range applies only to samples taken after fasting for at least 8 hours.   Comment 1 Notify RN    Comment 2 Document in Chart   Glucose, capillary     Status: Abnormal   Collection Time: 01/16/20  9:02 PM  Result Value Ref Range   Glucose-Capillary 124 (H) 70 - 99 mg/dL    Comment: Glucose reference range applies only to samples taken after fasting for at least 8 hours.  Heparin level (unfractionated)     Status: None   Collection Time: 01/17/20 12:57 AM  Result Value Ref Range   Heparin Unfractionated 0.31 0.30 - 0.70 IU/mL    Comment: (NOTE) If heparin results are below expected values, and patient dosage has  been confirmed, suggest follow up testing of antithrombin III levels. Performed at [facility], [place] [address], [place], [place] [address]   CBC     Status: Abnormal   Collection Time: 01/17/20 12:57 AM  Result Value Ref Range   WBC 9.5 4.0 - 10.5 K/uL   RBC 2.85 (L) 4.22 - 5.81 MIL/uL   Hemoglobin 8.2 (L) 13.0 - 17.0 g/dL   HCT 26.8 (L) 39 - 52 %   MCV 94.0 80.0 - 100.0 fL   MCH 28.8 26.0 - 34.0 pg   MCHC 30.6 30.0 - 36.0 g/dL   RDW 15.6 (H) 11.5 - 15.5 %   Platelets 116 (  L) 150 - 400 K/uL    Comment: REPEATED TO VERIFY PLATELET COUNT CONFIRMED BY SMEAR SPECIMEN CHECKED FOR CLOTS Immature Platelet Fraction may be clinically indicated, consider ordering this additional test [id number]    nRBC 0.0 0.0 - 0.2 %    Comment: Performed at [facility], [address], [place], [place] [address]  Glucose, capillary     Status: Abnormal   Collection Time: 01/17/20  6:19 AM  Result Value Ref Range   Glucose-Capillary 154 (H) 70 - 99 mg/dL    Comment: Glucose reference range applies only to samples taken after fasting for at least 8 hours.  Glucose, capillary     Status: Abnormal   Collection Time: 01/17/20 11:31 AM  Result Value Ref Range   Glucose-Capillary 323 (H) 70 - 99 mg/dL     Comment: Glucose reference range applies only to samples taken after fasting for at least 8 hours.   Comment 1 Notify RN    Comment 2 Document in Chart   Glucose, capillary     Status: Abnormal   Collection Time: 01/17/20  4:28 PM  Result Value Ref Range   Glucose-Capillary 193 (H) 70 - 99 mg/dL    Comment: Glucose reference range applies only to samples taken after fasting for at least 8 hours.   Comment 1 Notify RN    Comment 2 Document in Chart   Glucose, capillary     Status: Abnormal   Collection Time: 01/17/20  9:43 PM  Result Value Ref Range   Glucose-Capillary 152 (H) 70 - 99 mg/dL    Comment: Glucose reference range applies only to samples taken after fasting for at least 8 hours.  Renal function panel     Status: Abnormal   Collection Time: 01/18/20  5:53 AM  Result Value Ref Range   Sodium 136 135 - 145 mmol/L   Potassium 5.1 3.5 - 5.1 mmol/L   Chloride 103 98 - 111 mmol/L   CO2 23 22 - 32 mmol/L   Glucose, Bld 169 (H) 70 - 99 mg/dL    Comment: Glucose reference range applies only to samples taken after fasting for at least 8 hours.   BUN 47 (H) 8 - 23 mg/dL   Creatinine, Ser 1.89 (H) 0.61 - 1.24 mg/dL   Calcium 8.4 (L) 8.9 - 10.3 mg/dL   Phosphorus 4.3 2.5 - 4.6 mg/dL   Albumin 2.1 (L) 3.5 - 5.0 g/dL   GFR calc non Af Amer 14 (L) >60 mL/min   GFR calc Af Amer 16 (L) >60 mL/min   Anion gap 10 5 - 15    Comment: Performed at [facility], [address] [place], [place] [address]  CBC     Status: Abnormal   Collection Time: 01/18/20  5:53 AM  Result Value Ref Range   WBC 9.5 4.0 - 10.5 K/uL   RBC 3.44 (L) 4.22 - 5.81 MIL/uL   Hemoglobin 9.9 (L) 13.0 - 17.0 g/dL   HCT 31.2 (L) 39 - 52 %   MCV 92.4 80.0 - 100.0 fL   MCH 28.8 26.0 - 34.0 pg   MCHC 31.1 30.0 - 36.0 g/dL   RDW 81.1 88.6 - 77.3 %   Platelets 144 (L) 150 - 400 K/uL   nRBC 0.0 0.0 - 0.2 %    Comment: Performed at [facility], [address], [place], [place] [address]  Glucose, capillary      Status: Abnormal   Collection  Time: 01/18/20  6:04 AM  Result Value Ref Range   Glucose-Capillary 159 (H) 70 - 99 mg/dL    Comment: Glucose reference range applies only to samples taken after fasting for at least 8 hours.   No results found.     Medical Problem List and Plan: 1.  Decreased functional ability secondary to cellulitis/osteomyelitis right BKA with history of bilateral BKA.  Status post right BKA revision 01/12/2020.  Wound VAC has been discontinued  -patient may  Shower if R BKA wrapped  -ELOS/Goals: 5-9 days- mod I to supervision 2.  Antithrombotics: -DVT/anticoagulation: Eliquis  -antiplatelet therapy: Aspirin 81 mg daily 3. Pain Management: Oxycodone as needed 4. Mood: Zoloft 50 mg daily.  Provide emotional support  -antipsychotic agents: N/A 5. Neuropsych: This patient is capable of making decisions on his own behalf. 6. Skin/Wound Care: Routine skin checks 7. Fluids/Electrolytes/Nutrition: Routine in and outs with follow-up chemistries 8.  Acute kidney injury on CKD stage IV with history of kidney transplant 2016.  Long-term dialysis now initiated 01/04/2020.  Outpatient dialysis set up for Tuesday Thursday Saturday at [facility] dialysis center.  Continue low-dose chronic prednisone, Myfortic 180 mg twice daily as well as Prograf 2 mg twice daily 9.  Chronic anemia.  Follow-up CBC.  Continue Aranesp 10.  Diabetes mellitus with peripheral neuropathy (on chronic prednisone).  Hemoglobin A1c 10.3.  NovoLog 2 units 3 times daily, Lantus insulin 8 units daily. 11.  Atrial fibrillation.  Amiodarone as directed.  Continue Eliquis.  Cardiac rate controlled 12.  Hyperlipidemia.  Lipitor 13. Stage II sacral pressure ulcer- on admission- will need to con't foam dressing, keep pt off backside unless eating or doing therapy- might benefit from air mattress if won't impair transfers.     Lavon Paganini Angiulli, PA-C 01/18/2020    I have personally performed a face to face  diagnostic evaluation of this patient and formulated the key components of the plan.  Additionally, I have personally reviewed laboratory data, imaging studies, as well as relevant notes and concur with the physician assistant's documentation above.   The patient's status has not changed from the original H&P.  Any changes in documentation from the acute care chart have been noted above.

## 2020-01-18 NOTE — Plan of Care (Signed)

## 2020-01-18 NOTE — Progress Notes (Signed)
[facility] ROUNDING NOTE   Subjective:   Brief narrative: 64 year old gentleman with a history of renal transplant 2016 CKD stage IV, hypertension insulin-dependent diabetes atrial fibrillation status post bilateral BKA secondary to PVD with new onset right BKA stump pain.  Patient was admitted 12/30/2019.  Underwent stump revision for right stump osteomyelitis and abscess 01/12/2020  Baseline creatinine 2.8 to 3.4 mg/dL.  Started dialysis during admission for progression of his renal disease to end-stage renal disease.  There has been no signs of renal recovery.  He has been clipped to the outpatient dialysis center of [facility].  He will continue on a TTS schedule.  He had a successful dialysis treatment 01/15/2020 with 2 L ultrafiltration.  His next dialysis will be planned for 01/18/2020.  Blood pressure 154/66 pulse 64 temperature 98.4 O2 sats 97% room air.  Urine output 450 cc 01/16/2020  Labs: Pending this a.m.  Amiodarone 200 mg daily, amlodipine 5 mg daily, aspirin 81 mg daily, atorvastatin 10 mg daily, Eliquis 5 mg twice daily, vitamin D3 4000 units daily, Aranesp 60 mcg every Thursday  , insulin sliding scale, insulin Lantus 8 units daily, mycophenolate 180 mg twice daily, Protonix 40 mg daily, prednisone 5 mg daily, Zoloft 50 mg daily, tacrolimus 2 mg twice daily, Flomax 0.4 mg daily.   Objective:  Vital signs in last 24 hours:  Temp:  [97.8 F (36.6 C)-98.6 F (37 C)] 98.4 F (36.9 C) (08/31 0505) Pulse Rate:  [61-69] 64 (08/31 0505) Resp:  [14-19] 14 (08/31 0505) BP: (119-154)/(63-69) 154/66 (08/31 0505) SpO2:  [95 %-99 %] 98 % (08/31 0505)  Weight change:  Filed Weights   01/08/20 1215 01/11/20 1441 01/15/20 1450  Weight: 65.9 kg 70.8 kg 71 kg    Intake/Output: I/O last 3 completed shifts: In: 905.5 [P.O.:840; I.V.:65.5] Out: 450 [Urine:450]   Intake/Output this shift:  Total I/O In: -  Out: 300 [Urine:300]  General: Not in distress,  comfortable Heart:RRR, s1s2 nl, no rub Lungs: Clear b/l, no crackle Abdomen:soft, Non-tender, non-distended Extremities: Bilateral BKA, right BKA site has bandage applied. Dialysis Access: Right AV fistula has good thrill and bruit   Basic Metabolic Panel: Recent Labs  Lab 01/11/20 0854 01/11/20 0854 01/12/20 0712 01/12/20 0712 01/14/20 1115 01/15/20 1436 01/16/20 0538  NA 132*  --  137  --  137 136 141  K 4.6  --  4.0  --  4.2 4.8 4.5  CL 103  --  103  --  103 104 105  CO2 19*  --  25  --  27 25 28   GLUCOSE 293*  --  195*  --  195* 209* 255*  BUN 54*  --  25*  --  17 36* 19  CREATININE 4.17*  --  2.48*  --  2.31* 3.59* 2.22*  CALCIUM 8.3*   < > 8.1*   < > 7.8* 7.9* 8.0*  PHOS 1.8*  --  1.6*  --  2.7 3.7 2.9   < > = values in this interval not displayed.    Liver Function Tests: Recent Labs  Lab 01/11/20 0854 01/12/20 0712 01/14/20 1115 01/15/20 1436 01/16/20 0538  ALBUMIN 1.6* 1.6* 1.8* 1.7* 1.8*   No results for input(s): LIPASE, AMYLASE in the last 168 hours. No results for input(s): AMMONIA in the last 168 hours.  CBC: Recent Labs  Lab 01/13/20 0852 01/14/20 1115 01/15/20 0030 01/16/20 0547 01/17/20 0057  WBC 22.6* 9.7 9.7 9.0 9.5  HGB 7.0* 5.9* 8.3*  8.9* 8.2*  HCT 22.6* 19.6* 25.9* 29.0* 26.8*  MCV 94.6 94.7 91.5 94.5 94.0  PLT 141* 116* 124* 122* 116*    Cardiac Enzymes: No results for input(s): CKTOTAL, CKMB, CKMBINDEX, TROPONINI in the last 168 hours.  BNP: Invalid input(s): POCBNP  CBG: Recent Labs  Lab 01/17/20 0619 01/17/20 1131 01/17/20 1628 01/17/20 2143 01/18/20 0604  GLUCAP 154* 323* 193* 152* 159*    Microbiology: Results for orders placed or performed during the hospital encounter of 12/30/19  SARS Coronavirus 2 by RT PCR (hospital order, performed in [facility] hospital lab) Nasopharyngeal Nasopharyngeal Swab     Status: None   Collection Time: 12/31/19 11:15 AM   Specimen: Nasopharyngeal Swab  Result Value Ref Range  Status   SARS Coronavirus 2 NEGATIVE NEGATIVE Final    Comment: (NOTE) SARS-CoV-2 target nucleic acids are NOT DETECTED.  The SARS-CoV-2 RNA is generally detectable in upper and lower respiratory specimens during the acute phase of infection. The lowest concentration of SARS-CoV-2 viral copies this assay can detect is 250 copies / mL. A negative result does not preclude SARS-CoV-2 infection and should not be used as the sole basis for treatment or other patient management decisions.  A negative result may occur with improper specimen collection / handling, submission of specimen other than nasopharyngeal swab, presence of viral mutation(s) within the areas targeted by this assay, and inadequate number of viral copies (<250 copies / mL). A negative result must be combined with clinical observations, patient history, and epidemiological information.  Fact Sheet for Patients:   StrictlyIdeas.no  Fact Sheet for Healthcare Providers: BankingDealers.co.za  This test is not yet approved or  cleared by the [place] FDA and has been authorized for detection and/or diagnosis of SARS-CoV-2 by FDA under an Emergency Use Authorization (EUA).  This EUA will remain in effect (meaning this test can be used) for the duration of the COVID-19 declaration under Section 564(b)(1) of the Act, 21 U.S.C. section 360bbb-3(b)(1), unless the authorization is terminated or revoked sooner.  Performed at [facility], [place] [address], [place], [place] [address]   Surgical PCR screen     Status: Abnormal   Collection Time: 01/11/20 10:38 PM   Specimen: Nasal Mucosa; Nasal Swab  Result Value Ref Range Status   MRSA, PCR NEGATIVE NEGATIVE Final   Staphylococcus aureus POSITIVE (A) NEGATIVE Final    Comment: (NOTE) The Xpert SA Assay (FDA approved for NASAL specimens in patients 66 years of age and older), is one component of a  comprehensive surveillance program. It is not intended to diagnose infection nor to guide or monitor treatment. Performed at [facility], [place] [address], [place], [place] [address]     Coagulation Studies: No results for input(s): LABPROT, INR in the last 72 hours.  Urinalysis: No results for input(s): COLORURINE, LABSPEC, PHURINE, GLUCOSEU, HGBUR, BILIRUBINUR, KETONESUR, PROTEINUR, UROBILINOGEN, NITRITE, LEUKOCYTESUR in the last 72 hours.  Invalid input(s): APPERANCEUR    Imaging: No results found.   Medications:   . sodium chloride 10 mL/hr (01/16/20 2116)   . amiodarone  200 mg Oral Daily  . amLODipine  5 mg Oral Daily  . apixaban  5 mg Oral BID  . aspirin EC  81 mg Oral Daily  . atorvastatin  10 mg Oral Q M,W,F  . bisacodyl  10 mg Oral Daily  . Chlorhexidine Gluconate Cloth  6 each Topical Q0600  . Chlorhexidine Gluconate Cloth  6 each Topical Q0600  . cholecalciferol  4,000 Units Oral Daily  . darbepoetin (ARANESP) injection - DIALYSIS  60 mcg Intravenous Q Thu-HD  . docusate sodium  100 mg Oral BID  . insulin aspart  0-9 Units Subcutaneous TID WC  . insulin aspart  2 Units Subcutaneous TID WC  . insulin glargine  8 Units Subcutaneous Daily  . lipase/protease/amylase  12,000 Units Oral TID AC  . magnesium chloride  2 tablet Oral BID  . mycophenolate  180 mg Oral BID  . pantoprazole  40 mg Oral Daily  . polyethylene glycol  17 g Oral Daily  . predniSONE  5 mg Oral Q breakfast  . sertraline  50 mg Oral Daily  . tacrolimus  2 mg Oral BID  . tamsulosin  0.4 mg Oral QPC supper   acetaminophen **OR** acetaminophen, hydrALAZINE, ondansetron (ZOFRAN) IV, oxyCODONE, phenylephrine  Assessment/ Plan:  1. Acute kidney injury on CKD4, now progressed to ESRD: BL cr 2.8-3.4 s/p DDRT with DGF complicated by ACR and antibody mediated rejection requiring IVIG as recently as 2018.  Started dialysis 01/04/2020   No sign of renal recovery therefore we will continue TTS  schedule for dialysis.  He has outpatient dialysis set up for TTS at [facility]. Patient has functional dialysis access.  Status post HD yesterday with 2 L UF.  Clinically stable. Plan for next HD on 8/31.  2. Kidney transplant: Continue tacrolimus, mycophenolate, prednisone-tac tapered to 2/2 on 8/18->mycophenolate tapered to 180 mg twice daily on 8/22.  Pharmacy ordering tacrolimus level since patient is on amiodarone, follow-up.  Last tacrolimus level was less than 1 12/31/2019  3. Hypertension/volume: Continue amlodipine.  Managing volume with dialysis.  4. Secondary hyperparathyroidism/hyperphosphatemia: PTH level 43.  Phosphorus level improving, off binders, monitor lab.  5. Anemia of CKD, acute blood loss anemia: Received blood transfusion. Continue Aranesp  Iron saturation 81 Monitor hemoglobin and transfuse as needed.  6. Severe sepsis due to right leg osteomyelitis/abscess on BKA stump site: Treated with antibiotics.  Status post revision of right below-knee amputation by Dr. Sharol Given on 8/25.  May go to inpatient rehab.   LOS: [place] @TODAY @6 :25 AM

## 2020-01-18 NOTE — Progress Notes (Signed)
Inpatient Rehabilitation Admissions Coordinator  Cir bed is available today. I met with patient at bedside, contacted Dr., Rodena Piety, acute team and TOC. I will make the arrangements to admit today. Hemodialysis , Ulice Dash , made aware. Danne Baxter, RN, MSN Rehab Admissions Coordinator [phone] 01/18/2020 10:16 AM

## 2020-01-18 NOTE — TOC Initial Note (Signed)
Transition of Care [facility]) - Initial/Assessment Note    Patient Details  Name: Paul [name] MRN: [id number] Date of Birth: 02/26/1956  Transition of Care [facility]) CM/SW Contact:    Zenon Mayo, RN Phone Number: 01/18/2020, 9:48 AM  Clinical Narrative:                 Patient for dc to CIR today per Pamala Hurry B with CIR.  Expected Discharge Plan: IP Rehab Facility Barriers to Discharge: No Barriers Identified   Patient Goals and CMS Choice        Expected Discharge Plan and Services Expected Discharge Plan: [place]   Discharge Planning Services: CM Consult Post Acute Care Choice: NA Living arrangements for the past 2 months: Single Family Home                   DME Agency: NA       HH Arranged: NA          Prior Living Arrangements/Services Living arrangements for the past 2 months: Single Family Home Lives with:: Spouse          Need for Family Participation in Patient Care: Yes (Comment) Care giver support system in place?: Yes (comment)   Criminal Activity/Legal Involvement Pertinent to Current Situation/Hospitalization: No - Comment as needed  Activities of Daily Living Home Assistive Devices/Equipment: CBG Meter, Wheelchair, Eyeglasses, Radio producer (specify quad or straight), Walker (specify type) ADL Screening (condition at time of admission) Patient's cognitive ability adequate to safely complete daily activities?: Yes Is the patient deaf or have difficulty hearing?: No Does the patient have difficulty seeing, even when wearing glasses/contacts?: No Does the patient have difficulty concentrating, remembering, or making decisions?: No Patient able to express need for assistance with ADLs?: Yes Does the patient have difficulty dressing or bathing?: No Independently performs ADLs?: Yes (appropriate for developmental age) Does the patient have difficulty walking or climbing stairs?: Yes Weakness of Legs: Both Weakness of Arms/Hands:  None  Permission Sought/Granted                  Emotional Assessment       Orientation: : Oriented to Self, Oriented to Place, Oriented to  Time, Oriented to Situation Alcohol / Substance Use: Not Applicable Psych Involvement: No (comment)  Admission diagnosis:  Amputation stump infection ([place]) [T87.40] AKI (acute kidney injury) ([place]) [N17.9] History of renal transplant [Z94.0] Acute renal failure, unspecified acute renal failure type ([place]) [N17.9] Patient Active Problem List   Diagnosis Date Noted  . Amputation stump infection ([place])   . Subacute osteomyelitis of right tibia ([place])   . Dehiscence of amputation stump ([place])   . Sepsis ([place]) 01/04/2020  . Cellulitis 12/31/2019  . CKD stage 4 due to type 2 diabetes mellitus ([place]) 04/29/2019  . AKI (acute kidney injury) ([place]) 04/24/2019  . Hyperglycemia 04/24/2019  . Nausea and vomiting 04/24/2019  . Fever 04/23/2019  . Acute metabolic encephalopathy 40/98/1191  . Stroke ([place]) 10/28/2018  . Middle cerebral artery embolism, left 10/28/2018  . Metabolic acidosis 47/82/9562  . Dehydration 05/24/2018  . Pressure injury of skin 05/24/2018  . Dyspnea 05/23/2018  . Acute respiratory failure with hypoxia ([place]) 05/23/2018  . Abnormal cardiovascular stress test 12/29/2013  . Awaiting organ transplant 12/09/2013  . Leg ulcer ([place]) 11/17/2013  . Chronic kidney disease (CKD), stage V ([place]) 11/17/2013  . Diabetes ([place]) 11/17/2013  . Cancer of duodenum ([place]) 11/17/2013  . Dyslipidemia 11/17/2013  . Chronic kidney disease requiring  chronic dialysis ([place]) 11/17/2013  . Hypercholesteremia 11/17/2013  . H/O malignant neoplasm 11/17/2013  . H/O: HTN (hypertension) 11/17/2013  . BP (high blood pressure) 11/17/2013  . Hypotension 11/17/2013  . Angiopathy, peripheral ([place]) 11/17/2013  . AF (paroxysmal atrial fibrillation) ([place]) 11/17/2013  . BKA stump complication ([place]) 23/95/3202  . S/P bilateral BKA (below knee amputation) ([place])  08/03/2013  . S/P BKA (below knee amputation) bilateral ([place]) 07/30/2013  . Open wound of both legs with complication 33/43/5686  . Acute blood loss anemia 07/12/2013  . Syncope 07/09/2013  . Intolerance to CAPD peritoneal dialysis s/p CAPD cath removal 07/09/2013 07/05/2013  . Critical lower limb ischemia 06/30/2013  . Extremity pain 06/30/2013  . Atherosclerosis of native arteries of the extremities with ulceration(440.23) 06/17/2013  . Gait disorder 05/31/2013  . Weakness 05/24/2013  . Depression 04/03/2013  . GERD (gastroesophageal reflux disease) 04/03/2013  . Atrial fibrillation ([place]) 04/02/2013  . DM (diabetes mellitus) ([place]) 12/28/2012  . Other complications due to renal dialysis device, implant, and graft 05/19/2012  . Diabetes mellitus, type 2 ([place]) 11/13/2011  . Anemia 07/02/2011  . ESRD on hemodialysis ([place]) 05/29/2011   PCP:  [name], MD Pharmacy:   [id number] [place], [place] [address] Phone: [phone] Fax: [place], [place] [address] Phone: [phone] Fax: [phone]     Social Determinants of Health (SDOH) Interventions    Readmission Risk Interventions Readmission Risk Prevention Plan 01/18/2020 04/26/2019 11/04/2018  Transportation Screening Complete Complete Complete  Medication Review (RN Care Manager) Complete Complete Complete  PCP or Specialist appointment within 3-5 days of discharge Complete Not Complete Complete  PCP/Specialist Appt Not Complete comments - pending medical stability -  [place] or Home Care Consult Complete Complete Complete  SW Recovery Care/Counseling Consult Complete Complete Complete  Palliative Care Screening Not Applicable Not Complete Not Applicable  Comments - may be appropriate, will ask MD -  [place] Not Applicable Patient Refused Not Applicable  Some recent data  might be hidden

## 2020-01-18 NOTE — PMR Pre-admission (Signed)
PMR Admission Coordinator Pre-Admission Assessment  Patient: Paul [name] is an 64 y.o., male MRN: [id number] DOB: 12-Apr-1956 Height: 6' (182.9 cm) Weight: 71 kg  Insurance Information HMO:     PPO:      PCP:      IPA:      80/20:      OTHER:  PRIMARY: Medicare a and b      Policy#: [id number]      Subscriber: pt Benefits:  Phone #: passport one online     Name: 8/31 Eff. Date: 10/19/2015     Deduct: $9528      Out of Pocket Max: none      Life Max: none CIR: 100%      SNF: 20 full days Outpatient: 80%     Co-Pay: [place]: 100%      Co-Pay: none DME: 80%     Co-Pay: 20% Providers: pt choice  SECONDARY: [employer]/wife reports COBRA 8/1 until 08/31/2438      Policy#: [id number]     Phone#:   THIRD: [employer] of [place] [id number] wife reports effective beginning 01/19/2020  I have alerted pre service center  Financial Counselor:       Phone#:   The "Data Collection Information Summary" for patients in Inpatient Rehabilitation Facilities with attached "Privacy Act [place] Records" was provided and verbally reviewed with: Patient and Family  Emergency Contact Information Contact Information    Name Relation Home Work [place] Spouse [phone]  [phone]      Current Medical History  Patient Admitting Diagnosis: debility  History of Present Illness:    : HPI: Paul [name] is a 64 year old right-handed male with history of renal transplant 2016 with baseline creatinine 2.8-3.4 maintained on chronic prednisone, Prograf as well as Myfortic, hypertension, insulin-dependent diabetes mellitus, chronic atrial fibrillation on Eliquis, bilateral below-knee amputations receiving inpatient rehab services 08/03/2013 to 08/15/2013 as well as CIR 05/31/2013 to 06/11/2013 for multifactorial gait disorder related to peripheral neuropathy.  Independent with assistive device and prosthesis provided by [employer].  Presented 12/30/2019 with nonspecific pain and  rash at the end of right leg stump 3 days prior.  Admission chemistries glucose 269 BUN 80 creatinine 4.76, hemoglobin 10.4 WBC 13,300, lactic acid 0.5, SARS coronavirus negative, urinalysis negative nitrite, CK 56.  CT of right lower extremity showed swelling edema at the distal stump with possible cutaneous blister.  There was more focal hyperdense collection at the posterior lateral aspect of the distal stump measuring approximately 2.6 x 1.7 x 2.6 cm possibly representing hematoma.  MRI of right lower extremity showed no findings of osteomyelitis no drain able abscess.  Renal ultrasound showed no abscess or hydronephrosis.  Placed on vancomycin and cefepime for questionable early cellulitis.  Hospital course bouts of A. fib RVR 120s to 130 blood pressures 90s/60s received fluid bolus follow-up cardiology services and placed on IV amiodarone transition to p.o. as well as digoxin.  Follow-up renal services for progressive CKD stage IV necessitating need for dialysis initiated 01/04/2020 and arrangements have been made for ongoing outpatient dialysis on discharge with latest creatinine 4.14.  Follow-up Dr. Sharol Given for cellulitis suspect osteomyelitis right below-knee amputation had no change with conservative care and underwent revision right BKA 01/12/2020 and initial placement of wound VAC that was later discontinued.  Patient remains on Eliquis as prior to admission.  Acute on chronic anemia 9.9 and monitored.   Patient's medical record from [facility] has been reviewed by the rehabilitation  admission coordinator and physician.  Past Medical History  Past Medical History:  Diagnosis Date  . A-fib ([place])   . Anemia    esrd  . Calciphylaxis 05/2013   lower ext  . Cancer ([place])    hx duodenal adenoCa 2002, resected  . Closed left arm fracture 1967  . Corneal abrasion, left   . Depression   . Diabetes mellitus    Type 1 iddm x 11 yrs  . Diabetic neuropathy ([place])   . ESRD on hemodialysis ([place])     Pt has ESRD due to DM.  He had a L upper arm AVF prior to starting HD which was ligated due to L arm steal syndrome.  He started HD in Jan 2014 and did home HD.  The family couldn't cannulate the R arm AVF successfully so by mid 2014 they decided to switch to PD which was done late summer 2014.  In Jan 2015 he was admitted with FTT felt to be due to underdialysis on PD and PD was abandoned and he   . GERD (gastroesophageal reflux disease)   . Glomerulosclerosis, diabetic ([place])   . Heart murmur   . History of blood transfusion   . Hypertension    off of meds due to orthostatic hypotension  . Kidney transplant recipient   . Open wound of both legs with complication    Pt has had progressive wounds of both LE's including gangrene of the toes and patchy necrosis of the calves.  He has been treated at [facility] by Dr Jerline Pain with hyperbaric O2 5d per week.  He is getting Na Thiosulfate with HD for suspected calciphylaxis. Pt says doctor's aren't sure if these ulcers were diabetic ulcers or calciphylaxis.  He underwent bilat BKA on 07/30/13.  He says that the woun    Family History   family history includes Cancer in his father and mother; Heart disease in his maternal grandfather; Stroke in his paternal grandmother.  Prior Rehab/Hospitalizations Has the patient had prior rehab or hospitalizations prior to admission? Yes CIR in 2015 twice  Has the patient had major surgery during 100 days prior to admission? Yes   Current Medications  Current Facility-Administered Medications:  .  0.9 %  sodium chloride infusion, , Intravenous, Continuous, Persons, Bevely Palmer, [place], Last Rate: 10 mL/hr at 01/16/20 2116, 10 mL/hr at 01/16/20 2116 .  acetaminophen (TYLENOL) tablet 650 mg, 650 mg, Oral, Q6H PRN, 650 mg at 01/09/20 1928 **OR** acetaminophen (TYLENOL) suppository 650 mg, 650 mg, Rectal, Q6H PRN, Persons, Bevely Palmer, PA .  [COMPLETED] amiodarone (PACERONE) tablet 400 mg, 400 mg, Oral, BID, 400 mg at  01/12/20 2116 **FOLLOWED BY** amiodarone (PACERONE) tablet 200 mg, 200 mg, Oral, Daily, Persons, Bevely Palmer, PA, 200 mg at 01/18/20 0902 .  amLODipine (NORVASC) tablet 5 mg, 5 mg, Oral, Daily, Persons, Bevely Palmer, [place], 5 mg at 01/18/20 0902 .  apixaban (ELIQUIS) tablet 5 mg, 5 mg, Oral, BID, Kris Mouton, RPH, 5 mg at 01/18/20 9024 .  aspirin EC tablet 81 mg, 81 mg, Oral, Daily, Persons, Bevely Palmer, [place], 81 mg at 01/18/20 0904 .  atorvastatin (LIPITOR) tablet 10 mg, 10 mg, Oral, Q M,W,F, Persons, Bevely Palmer, PA, 10 mg at 01/17/20 1128 .  bisacodyl (DULCOLAX) EC tablet 10 mg, 10 mg, Oral, Daily, Georgette Shell, MD, 10 mg at 01/18/20 0902 .  Chlorhexidine Gluconate Cloth 2 % PADS 6 each, 6 each, Topical, Q0600, Persons, Bevely Palmer, [place], 6 each at 01/18/20  9357 .  Chlorhexidine Gluconate Cloth 2 % PADS 6 each, 6 each, Topical, Q0600, Elvis Coil, MD .  cholecalciferol (VITAMIN D3) tablet 4,000 Units, 4,000 Units, Oral, Daily, Persons, [place], [place], 4,000 Units at 01/18/20 0902 .  Darbepoetin Alfa (ARANESP) injection 60 mcg, 60 mcg, Intravenous, Q Thu-HD, Elvis Coil, MD, 60 mcg at 01/06/20 1821 .  docusate sodium (COLACE) capsule 100 mg, 100 mg, Oral, BID, Persons, [place], PA, 100 mg at 01/18/20 0902 .  hydrALAZINE (APRESOLINE) tablet 25 mg, 25 mg, Oral, Q6H PRN, Persons, [place], PA .  insulin aspart (novoLOG) injection 0-9 Units, 0-9 Units, Subcutaneous, TID WC, Persons, [place], [place], 2 Units at 01/18/20 [phone] .  insulin aspart (novoLOG) injection 2 Units, 2 Units, Subcutaneous, TID WC, Persons, [place], [place], 2 Units at 01/18/20 [phone] .  insulin glargine (LANTUS) injection 8 Units, 8 Units, Subcutaneous, Daily, Alwyn Ren, MD, 8 Units at 01/18/20 [phone] .  lipase/protease/amylase (CREON) capsule 12,000 Units, 12,000 Units, Oral, TID AC, Persons, [place], [place], 12,000 Units at 01/18/20 0901 .  magnesium chloride (SLOW-MAG) 64 MG SR tablet 128 mg, 2 tablet, Oral, BID, Persons, [place], [place], 128 mg at 01/18/20 0903 .  mycophenolate (MYFORTIC) EC tablet 180 mg, 180 mg, Oral, BID, Persons, [place], [place], 180 mg at 01/18/20 0902 .  ondansetron (ZOFRAN) injection 4 mg, 4 mg, Intravenous, Q6H PRN, Persons, [place], PA, 4 mg at 01/13/20 1340 .  oxyCODONE (Oxy IR/ROXICODONE) immediate release tablet 5-10 mg, 5-10 mg, Oral, Q4H PRN, Persons, [place], PA .  pantoprazole (PROTONIX) EC tablet 40 mg, 40 mg, Oral, Daily, Persons, [place], [place], 40 mg at 01/18/20 0902 .  phenylephrine (NEO-SYNEPHRINE) 0.5 % nasal solution 1 drop, 1 drop, Each Nare, Q6H PRN, Persons, [place], PA .  polyethylene glycol (MIRALAX / GLYCOLAX) packet 17 g, 17 g, Oral, Daily, Alwyn Ren, MD, 17 g at 01/18/20 6730 .  predniSONE (DELTASONE) tablet 5 mg, 5 mg, Oral, Q breakfast, Persons, [place], [place], 5 mg at 01/18/20 0903 .  sertraline (ZOLOFT) tablet 50 mg, 50 mg, Oral, Daily, Persons, [place], [place], 50 mg at 01/18/20 0902 .  tacrolimus (PROGRAF) capsule 2 mg, 2 mg, Oral, BID, Persons, [place], [place], 2 mg at 01/18/20 0904 .  tamsulosin (FLOMAX) capsule 0.4 mg, 0.4 mg, Oral, QPC supper, Persons, [place], PA, 0.4 mg at 01/17/20 1736  Patients Current Diet:  Diet Order            Diet - low sodium heart healthy           Diet Carb Modified Fluid consistency: Thin; Room service appropriate? Yes  Diet effective now                 Precautions / Restrictions Precautions Precautions: Fall Precaution Comments: bil BKAs; wound vac; no pillow under R knee Restrictions Weight Bearing Restrictions: No RLE Weight Bearing: Non weight bearing   Has the patient had 2 or more falls or a fall with injury in the past year? No  Prior Activity Level Limited Community (1-2x/wk): Mod I with RW, prosthesis and adaptive equipment  Prior Functional Level Self Care: Did the patient need help bathing, dressing, using the toilet or eating? Independent  Indoor Mobility: Did the patient need assistance  with walking from room to room (with or without device)? Independent  Stairs: Did the patient need assistance with internal or external stairs (with or without device)? Independent  Functional  Cognition: Did the patient need help planning regular tasks such as shopping or remembering to take medications? Independent  Home Equities trader / Equipment Home Assistive Devices/Equipment: CBG Meter, Wheelchair, Eyeglasses, [place] (specify quad or straight), Environmental consultant (specify type) Home Equipment: Kasandra Knudsen - single point, [employer], Wheelchair - manual, Crutches, Environmental consultant - 2 wheels, Hand held shower head, Grab bars - toilet, Shower seat - built in  Prior Device Use: Indicate devices/aids used by the patient prior to current illness, exacerbation or injury? Walker and Orthotics/Prosthetics  Current Functional Level Cognition  Overall Cognitive Status: Impaired/Different from baseline Current Attention Level: Selective Orientation Level: Oriented X4 Safety/Judgement: Decreased awareness of deficits, Decreased awareness of safety General Comments: pt unaware of incontinent stool, difficulty with recent events and decreased awareness of current function    Extremity Assessment (includes Sensation/Coordination)  Upper Extremity Assessment: RUE deficits/detail, LUE deficits/detail RUE Deficits / Details: generalized weakness; significant wasting of intrinsics; drops items occasionally RUE Sensation: decreased light touch RUE Coordination: decreased fine motor LUE Deficits / Details: generalized weakness  Lower Extremity Assessment: Defer to PT evaluation RLE Deficits / Details: Limited knee extension AROM    ADLs  Overall ADL's : Needs assistance/impaired Grooming: Set up, Sitting Upper Body Bathing: Set up, Sitting, Bed level Lower Body Bathing: Moderate assistance, Bed level Upper Body Dressing : Minimal assistance, Bed level Lower Body Dressing: Moderate assistance, Bed level Toileting-  Clothing Manipulation and Hygiene: Moderate assistance, Bed level Functional mobility during ADLs:  (declined OOB) General ADL Comments: limited today by fatigue/low Hgb    Mobility  Overal bed mobility: Needs Assistance Bed Mobility: Supine to Sit, Sidelying to Sit, Sit to Sidelying Rolling: Supervision (heavy use of rails) Sidelying to sit: Mod assist Supine to sit: Min assist, HOB elevated Sit to sidelying: Min guard General bed mobility comments: pt able to transition from supine to sit with HOB 30 degrees with only guarding. In sitting with pivot in bed noted large incontinent stool with pt assisted to sidelying for pericare then mod assist to return to sitting from sidelying    Transfers  Overall transfer level: Needs assistance Transfers: Lateral/Scoot Transfers  Lateral/Scoot Transfers: Min assist General transfer comment: pt able to initiate scooting toward chair with drop arm lateral scoot but required assist of pad and cues to complete transition fully from bed to chair    Ambulation / Gait / Stairs / Wheelchair Mobility  Ambulation/Gait General Gait Details: unable    Posture / Balance Dynamic Sitting Balance Sitting balance - Comments: Initially requiring Max A with BUE support progressing to 1 UE support progressing to no UE support. Max A progressing to close Min guard. Worked on reaching outside [employer] in all directions with [place]. Balance Overall balance assessment: Needs assistance Sitting-balance support: Single extremity supported, No upper extremity supported Sitting balance-Leahy Scale: Poor Sitting balance - Comments: Initially requiring Max A with BUE support progressing to 1 UE support progressing to no UE support. Max A progressing to close Min guard. Worked on reaching outside [employer] in all directions with [place]. Postural control: Posterior lean    Special needs/care consideration  Designated visitor is wife, [name] Began hemodialysis this admission  Arranged for  TTS at [facility] dialysis center    Previous Home Environment  Living Arrangements: Spouse/significant other  Lives With: Spouse Available Help at Discharge: Family, Available 24 hours/day Type of Home: House Home Layout: Two level, Able to live on main level with bedroom/bathroom Alternate Level Stairs-Rails: None Alternate Level Stairs-Number of Steps: 1 step  down into the living room Home Access: Ramped entrance Bathroom Shower/Tub: Multimedia programmer: Standard Bathroom Accessibility: Yes How Accessible: Accessible via wheelchair, Accessible via walker [place]: No Additional Comments: was doing outpatient therapy  Discharge Living Setting Plans for Discharge Living Setting: Patient's home, Lives with (comment) (wife) Type of Home at Discharge: House Discharge Home Layout: Two level, Able to live on main level with bedroom/bathroom Alternate Level Stairs-Number of Steps: 1 step down into living room Discharge Home Access: [place] entrance Discharge Bathroom Shower/Tub: Walk-in shower Discharge Bathroom Toilet: Standard Discharge Bathroom Accessibility: Yes How Accessible: Accessible via wheelchair, Accessible via walker Does the patient have any problems obtaining your medications?: No  Social/Family/Support Systems Contact Information: wife, [name] Anticipated Caregiver: wife Anticipated Ambulance person Information: see above Ability/Limitations of Caregiver: no limitations Caregiver Availability: 24/7 Discharge Plan Discussed with Primary Caregiver: Yes Is Caregiver In Agreement with Plan?: Yes Does Caregiver/Family have Issues with Lodging/Transportation while Pt is in Rehab?: No  Goals Patient/Family Goal for Rehab: Mod I to supervision with PT and OT at wheelchair level Expected length of stay: ELOS 7 to 10 days Additional Information: Hisotry of renal transplant. New to hemodialysis this admission Pt/Family Agrees to Admission and willing  to participate: Yes Program Orientation Provided & Reviewed with Pt/Caregiver Including Roles  & Responsibilities: Yes  Decrease burden of Care through IP rehab admission: n/a  Possible need for SNF placement upon discharge: not anticipated  Patient Condition: I have reviewed medical records from [facility] , spoken with CM, and patient and spouse. I met with patient at the bedside for inpatient rehabilitation assessment.  Patient will benefit from ongoing PT and OT, can actively participate in 3 hours of therapy a day 5 days of the week, and can make measurable gains during the admission.  Patient will also benefit from the coordinated team approach during an Inpatient Acute Rehabilitation admission.  The patient will receive intensive therapy as well as Rehabilitation physician, nursing, social worker, and care management interventions.  Due to bladder management, bowel management, safety, skin/wound care, disease management, medication administration, pain management and patient education the patient requires 24 hour a day rehabilitation nursing.  The patient is currently min assist  with mobility and basic ADLs.  Discharge setting and therapy post discharge at home with outpatient is anticipated.  Patient has agreed to participate in the Acute Inpatient Rehabilitation Program and will admit today.  Preadmission Screen Completed By:  Cleatrice Burke, 01/18/2020 11:17 AM ______________________________________________________________________   Discussed status with Dr. Dagoberto Ligas  on  01/18/2020 at  1128 and received approval for admission today.  Admission Coordinator:  Cleatrice Burke, RN, time 5009 Date  01/18/2020   Assessment/Plan: Diagnosis: 1. Does the need for close, 24 hr/day Medical supervision in concert with the patient's rehab needs make it unreasonable for this patient to be served in a less intensive setting? Yes 2. Co-Morbidities requiring supervision/potential  complications: B/L BKAs, ESRD s/p renal transplant- on Vanc/Cefipime, Dm- A1c 10.3; Afib,  3. Due to bowel management, safety, skin/wound care, disease management, medication administration, pain management and patient education, does the patient require 24 hr/day rehab nursing? Yes 4. Does the patient require coordinated care of a physician, rehab nurse, PT, OT, and SLP to address physical and functional deficits in the context of the above medical diagnosis(es)? Yes Addressing deficits in the following areas: balance, endurance, locomotion, strength, transferring, bathing, dressing, feeding, grooming and toileting 5. Can the patient actively participate in an intensive  therapy program of at least 3 hrs of therapy 5 days a week? Yes 6. The potential for patient to make measurable gains while on inpatient rehab is fair 7. Anticipated functional outcomes upon discharge from inpatient rehab: modified independent and supervision PT, modified independent and supervision OT, n/a SLP 8. Estimated rehab length of stay to reach the above functional goals is: 7-10 days 9. Anticipated discharge destination: Home 10. Overall Rehab/Functional Prognosis: fair   MD Signature:

## 2020-01-18 NOTE — Progress Notes (Signed)
Inpatient Rehabilitation Medication Review by a Pharmacist  A complete drug regimen review was completed for this patient to identify any potential clinically significant medication issues.  Clinically significant medication issues were identified:  Yes  Type of Medication Issue Identified Description of Issue Urgent (address now) Non-Urgent (address on AM team rounds) Plan Plan Accepted by Provider? (Yes / No / Pending AM Rounds)  Drug Interaction(s) (clinically significant)       Duplicate Therapy       Allergy       No Medication Administration End Date       Incorrect Dose       Additional Drug Therapy Needed       Other  The following medications were listed on pt's PTA med list, but were not ordered at [facility] or at CIR: calcitriol, PhosLo, sodium bicarbonate  Hydralazine PRN was listed on pt's PTA med list and also ordered at [facility], but was not ordered on admission to CIR Non urgent CIR team to review on AM rounds Pending AM rounds    Name of provider notified for urgent issues identified: N/A  For non-urgent medication issues to be resolved on team rounds tomorrow morning a CH[facility] Secure Chat Handoff was sent to: N/A; CIR admission orders released for review ~21:30 PM  Time spent performing this drug regimen review (minutes):  [place], PharmD, BCPS, [facility] Clinical Pharmacist 01/18/2020 9:52 PM

## 2020-01-18 NOTE — Discharge Summary (Signed)
Physician Discharge Summary  Paul [name] JJO:[id number] DOB: 29-Oct-1955 DOA: 12/30/2019  PCP: [name], MD  Admit date: 12/30/2019 Discharge date: 01/18/2020  Admitted From: Home Disposition: cir Recommendations for Outpatient Follow-up:  1. Follow up with PCP in 1-2 weeks 2. Please obtain BMP/CBC in one week 3. Please follow up with Dr. Smiley Houseman none Equipment/Devices none Discharge Condition: Stable CODE STATUS: Full code Diet recommendation: Cardiac Brief/Interim Summary:HPI on 12/31/2019 by Dr. Ysidro Evert Bairdis a 64 y.o.malewith medical history significant ofkidney transplant recipient(2016)with CKD stage IV,hypertension, IDDM,chronic A. fib on Xarelto, B/L BKAsecondary to PVD, presented with new onset of rightBKA stump pain and rash.Symptoms started about 1 week ago, patient attributed that to a unfit prosthetic which might caused a skin abrasions.He started to notice significant pain rash associated the end of right leg stump about 3 days ago. Pain has been constant and gradually getting worse, denies any fever chills.He took some Tylenol for the pain without any relief.  Patient has been following with [facility] for the kidney transplant, he was seen in the office about 2 weeks ago and multiple blood work and urine analysis sent,urine protein/Cre>5,000,patient however was not informed about results yet. And patient claims his kidney function has been stable,denies any back pain, decreased urine output or urine color.  Interim history  Patient was admitted with sepsis secondary to right leg osteomyelitis with an abscess on BKA stump.  She was placed on IV antibiotics with vancomycin and cefepime.  Patient will need incision and drainage.  Also noted to have atrial fibrillation with RVR, cardiology consulted.  Nephrology consulted patient is having AKI on CKD, stage IV. Pending revision of amputation 01/12/2020   Discharge  Diagnoses:  Principal Problem:   Sepsis ([place]) Active Problems:   ESRD on hemodialysis ([place])   Anemia   DM (diabetes mellitus) ([place])   S/P BKA (below knee amputation) bilateral ([facility])   AF (paroxysmal atrial fibrillation) ([facility])   AKI (acute kidney injury) ([place])   CKD stage 4 due to type 2 diabetes mellitus ([place])   Cellulitis   Amputation stump infection ([place])   Subacute osteomyelitis of right tibia ([facility])   Dehiscence of amputation stump ([facility])  Severe sepsis secondary to right leg osteomyelitis and abscess on BKA stump site -Sepsis was present on admission -CT scan of lower extremity showed evidence of early osteomyelitis and possible abscess of source of infection -He was treated with vancomycin and cefepime -Orthopedic surgery consulted had revision of the right BKA on 01/12/2020.  MRI of the affected extremity-which showed no findings of osteomyelitis in the distal femur, patella or 60 m of tibia.  Unfortunately patient had cooperation issues therefore the distal 2 cm of the tibia/fibula and overlying stump were not included in the imaging, especially hyperdense collection noted on CT scan on 12/31/2019.  Small knee effusion, skeptical of septic joint given lack of synovitis.  Acute metabolic encephalopathy -likely secondary to the above -Resolved, patient currently alert and oriented x3  Atrial fibrillation with RVR -Patient with chronic atrial fibrillation -Noted to have tachycardia with hypotension -Currently in sinus rhythm  -Cardiology consulted  -was placed on amiodarone drip- cardiology is transitioning to oral for 1 months course, starting with 400mg  BID for 7 days, followed by 200mg  for 21 days (starting 8/19) Continue Eliquis patient has no further bleeding from the right stump or from the nose.  Hemoglobin stable.  Acute kidney injury on chronic kidney disease, stage IV with severe metabolic  acidosis  -history of renal transplant -Nephrology consulted and appreciated,  patient tolerated HD -Patient received bicarb for metabolic acidosis -Continue tacrolimus, prednisone, mycophenolate  Hypokalemia -Resolved, currently 4.6, continue to monitor BMP  Diabetes mellitus, type II, uncontrolled with hyperglycemia -Hemoglobin A1c 10.3 -patient is on low-dose prednisone -Continue insulin sliding scale and CBG monitoring  Mild hypomagnesemia  -Resolved with replacement, continue to monitor and replace as needed  History of pancreatic insufficiency -Patient had duodenal adenocarcinoma status post resection -Continue Creon which was added on 01/01/2020  Essential hypertension-blood pressure 154/66 continue hydralazine  Hyperlipidemia -continue statin  Anemia of chronic disease -Hemoglobin 9.9 on discharge -Continue to monitor CBC and transfuse as needed  Estimated body mass index is 21.23 kg/m as calculated from the following:   Height as of this encounter: 6' (1.829 m).   Weight as of this encounter: 71 kg.  Discharge Instructions  Discharge Instructions    Diet - low sodium heart healthy   Complete by: As directed    Discharge wound care:   Complete by: As directed    See orders   Increase activity slowly   Complete by: As directed    Negative Pressure Wound Therapy - Incisional   Complete by: As directed    Show patient how to attach prevena vac     Allergies as of 01/18/2020      Reactions   Metformin And Related Other (See Comments)   Has kidney problems      Medication List    TAKE these medications   acetaminophen 500 MG tablet Commonly known as: TYLENOL Take 500 mg by mouth every 6 (six) hours as needed for mild pain or moderate pain. What changed: Another medication with the same name was added. Make sure you understand how and when to take each.   acetaminophen 325 MG tablet Commonly known as: TYLENOL Take 2 tablets (650 mg total) by mouth every 6 (six) hours as needed for mild pain (or Fever >/= 101). What  changed: You were already taking a medication with the same name, and this prescription was added. Make sure you understand how and when to take each.   amiodarone 200 MG tablet Commonly known as: PACERONE Take 1 tablet (200 mg total) by mouth daily. Start taking on: January 19, 2020   amLODipine 10 MG tablet Commonly known as: NORVASC Take 10 mg by mouth daily.   aspirin 81 MG chewable tablet Chew 81 mg by mouth daily.   atorvastatin 10 MG tablet Commonly known as: LIPITOR Take 1 tablet (10 mg total) by mouth daily. What changed: when to take this   Basaglar KwikPen 100 UNIT/ML Inject 10 Units into the skin daily.   bisacodyl 5 MG EC tablet Commonly known as: DULCOLAX Take 2 tablets (10 mg total) by mouth daily. Start taking on: January 19, 2020   calcitRIOL 0.25 MCG capsule Commonly known as: ROCALTROL Take 0.25 mcg by mouth daily.   [employer] D3 50 MCG (2000 UT) Caps Generic drug: Cholecalciferol Take 4,000 Units by mouth daily.   docusate sodium 100 MG capsule Commonly known as: COLACE Take 1 capsule (100 mg total) by mouth 2 (two) times daily.   Eliquis 5 MG Tabs tablet Generic drug: apixaban TAKE 1 TABLET BY MOUTH TWICE A DAY What changed: how much to take   feeding supplement (GLUCERNA SHAKE) Liqd Take 237 mLs by mouth 3 (three) times daily between meals.   HumaLOG KwikPen 100 UNIT/ML KwikPen Generic drug: insulin lispro Inject 4-8 Units  into the skin daily. Sliding Scale   hydrALAZINE 25 MG tablet Commonly known as: APRESOLINE Take 1 tablet (25 mg total) by mouth every 6 (six) hours as needed (SBP>150). What changed:   medication strength  how much to take  when to take this  reasons to take this  additional instructions   lipase/protease/amylase 12000-38000 units Cpep capsule Commonly known as: CREON Take 1 capsule (12,000 Units total) by mouth 3 (three) times daily before meals.   mycophenolate 180 MG EC tablet Commonly known as:  MYFORTIC Take 360 mg by mouth 2 (two) times daily.   omeprazole 40 MG capsule Commonly known as: PRILOSEC Take 40 mg by mouth at bedtime.   PhosLo 667 MG capsule Generic drug: calcium acetate Take 1,334 mg by mouth 3 (three) times daily with meals.   polyethylene glycol 17 g packet Commonly known as: MIRALAX / GLYCOLAX Take 17 g by mouth daily. Start taking on: January 19, 2020   predniSONE 5 MG tablet Commonly known as: DELTASONE Take 5 mg by mouth daily with breakfast.   sertraline 50 MG tablet Commonly known as: ZOLOFT Take 50 mg by mouth at bedtime.   Slow-Mag 64 MG Tbec SR tablet Generic drug: magnesium chloride Take 2 tablets by mouth 2 (two) times daily. Take at least 2 hours separate from tacrolimus.   sodium bicarbonate 650 MG tablet Take 1,950 mg by mouth 4 (four) times daily.   tacrolimus 1 MG capsule Commonly known as: PROGRAF Take 2-3 mg by mouth See admin instructions. Take 3 mg by mouth in the morning at 9 AM and 2 mg in the evening at 9 PM   tamsulosin 0.4 MG Caps capsule Commonly known as: FLOMAX Take 0.4 mg by mouth daily after supper.            Discharge Care Instructions  (From admission, onward)         Start     Ordered   01/18/20 0000  Discharge wound care:       Comments: See orders   01/18/20 1040          Follow-up Information    O'Neal, Cassie Freer, MD Follow up on 02/24/2020.   Specialties: Internal Medicine, Cardiology, Radiology Why: @ 9:20AM Contact information: [place] [address] [phone]        Suzan Slick, NP In 1 week.   Specialty: Orthopedic Surgery Contact information: [place] [address] [phone]        [name], MD Follow up.   Specialty: Family Medicine Contact information: [address] [place] [address] [phone]              Allergies  Allergen Reactions  . Metformin And Related Other (See Comments)     Has kidney problems    Consultations: Cardiology, nephrology  Procedures/Studies: DG Chest 1 View  Result Date: 01/04/2020 CLINICAL DATA:  Pulmonary edema EXAM: CHEST  1 VIEW COMPARISON:  10/12/2019 FINDINGS: The heart size and mediastinal contours are within normal limits. Both lungs are clear. The visualized skeletal structures are unremarkable. IMPRESSION: No active disease. Electronically Signed   By: Ulyses Jarred M.D.   On: 01/04/2020 01:21   US Renal Transplant w/Doppler  Result Date: 12/31/2019 CLINICAL DATA:  Acute renal failure. History of a renal transplantation. EXAM: ULTRASOUND OF RENAL TRANSPLANT WITH RENAL DOPPLER ULTRASOUND TECHNIQUE: Ultrasound examination of the renal transplant was performed with gray-scale, color and duplex doppler evaluation. COMPARISON:  CT, 04/23/2019. FINDINGS: Transplant kidney location: RLQ Transplant Kidney: Renal measurements: 11.1 x 4.3 x 5.2 cm = volume: 130.51mL. Normal in size and parenchymal echogenicity. No evidence of mass or hydronephrosis. Trace perinephric fluid adjacent to the lower pole. Color flow in the main renal artery:  Yes Color flow in the main renal vein:  Yes Duplex Doppler Evaluation: Main Renal Artery Resistive Index: 0.87. Velocity, peak systolic, 417.4 centimeters/second. Venous waveform in main renal vein:  Present, 19 centimeters/second Intrarenal resistive index in upper pole:  0.74 (normal 0.6-0.8; equivocal 0.8-0.9; abnormal >= 0.9) Intrarenal resistive index in lower pole: 0.97 (normal 0.6-0.8; equivocal 0.8-0.9; abnormal >= 0.9) Bladder: Some debris.  No wall thickening, stone or mass. Other findings:  None. IMPRESSION: 1. Abnormally elevated resistive index in the lower pole of the transplant kidney with loss of the diastolic waveform. Resistive index measures 0.97. 2. Trace perinephric fluid adjacent to the lower pole. 3. No other abnormality. Electronically Signed   By: Lajean Manes M.D.   On: 12/31/2019 13:59   MR  TIBIA FIBULA RIGHT WO CONTRAST  Result Date: 01/04/2020 CLINICAL DATA:  Possible osteomyelitis. EXAM: MRI OF LOWER RIGHT EXTREMITY WITHOUT CONTRAST TECHNIQUE: Multiplanar MR images of the remaining tibia/fibula were attempted. The has a below the knee amputation. There were cooperation issues and patient motion related issues because of this the distal stump and part of the tibia and fibula were not included. The technologist reports that this was the best exam the could be achieved due to cooperation related issues. COMPARISON:  CT scan 12/31/2019 FINDINGS: Despite efforts by the technologist and patient, motion artifact is present on today's exam and could not be eliminated. This reduces exam sensitivity and specificity. Bones/Joint/Cartilage No findings of osteomyelitis in the distal femur, in the patella, or in the included proximal 6 cm of the tibia. Due to cooperation related issues, we did not visualize about 2 cm of the distal most tibia or the overlying stump. A knee effusion is present. No compelling degree of synovitis to suggest that this is from septic joint, and no abnormal marrow signal along the joint surfaces. Ligaments The anterior and posterior cruciate ligaments appear intact. Muscles and Tendons No unexpected findings. Soft tissues Mild diffuse subcutaneous edema. IMPRESSION: 1. No findings of osteomyelitis in the distal femur, patella, or in the included proximal 6 cm of the tibia. Please note that the patient cooperation related issues, the distal 2 cm of the tibia/fibula, and the overlying stump, were not included in imaging. This includes the hyperdense collection previously seen along the stump which was thought to potentially be a hematoma on the prior CT from 12/31/2019. 2. No drainable abscess. 3. Small knee effusion. I am skeptical of septic joint given the lack of synovitis. 4. Despite efforts by the technologist and patient, motion artifact is present on today's exam and could not  be eliminated. This reduces exam sensitivity and specificity. Electronically Signed   By: Van Clines M.D.   On: 01/04/2020 20:16   CT EXTREMITY LOWER RIGHT WO CONTRAST  Result Date: 12/31/2019 CLINICAL DATA:  Pain and swelling at right BKA stump EXAM: CT OF THE LOWER RIGHT EXTREMITY WITHOUT CONTRAST TECHNIQUE: Multidetector CT imaging of the right lower extremity was performed according to the standard protocol. COMPARISON:  None available FINDINGS: Bones/Joint/Cartilage Patient is status post below-knee amputation of the right lower extremity. Subtle loss of the cortical definition of the distal amputation margin of the tibia (series 9, image 47; series 7, image 82). The  residual fibula appears intact with preservation of the distal cortex. The patella and visualized distal femur appear intact. Bones are demineralized. No acute fracture. No dislocation. Joint spaces are relatively preserved. Small nonspecific knee joint effusion. Ligaments Suboptimally assessed by CT. Collateral ligaments appear grossly intact. Muscles and Tendons No acute musculotendinous abnormality. Soft tissues Swelling and edema at the distal stump with possible cutaneous blister (series 8, image 74). There is a more focal slightly hyperdense collection at the posterolateral aspect of the distal stump measuring approximately 2.6 x 1.7 x 2.6 cm (series 4, image 54), which could represent a hematoma. No soft tissue gas. Extensive vascular calcifications. IMPRESSION: 1. Status post below-knee amputation of the right lower extremity. Subtle loss of the cortical definition of the distal amputation margin of the tibia, raises the suspicion for early acute osteomyelitis. 2. Swelling and edema at the distal stump with possible cutaneous blister. There is a more focal hyperdense collection at the posterolateral aspect of the distal stump measuring approximately 2.6 x 1.7 x 2.6 cm, which could represent a hematoma. 3. Small nonspecific knee  joint effusion. Electronically Signed   By: Davina Poke D.O.   On: 12/31/2019 13:23    (Echo, Carotid, EGD, Colonoscopy, ERCP)    Subjective: Resting in bed in bed had bm...  Discharge Exam: Vitals:   01/18/20 0505 01/18/20 0746  BP: (!) 154/66   Pulse: 64   Resp: 14   Temp: 98.4 F (36.9 C) 98.4 F (36.9 C)  SpO2: 98%    Vitals:   01/17/20 2009 01/17/20 2329 01/18/20 0505 01/18/20 0746  BP: 130/69 140/67 (!) 154/66   Pulse: 61 62 64   Resp: 19 14 14    Temp: 97.8 F (36.6 C) 97.8 F (36.6 C) 98.4 F (36.9 C) 98.4 F (36.9 C)  TempSrc: Oral Oral Oral Oral  SpO2: 96% 95% 98%   Weight:      Height:        General: Pt is alert, awake, not in acute distress Cardiovascular: RRR, S1/S2 +, no rubs, no gallops Respiratory: CTA bilaterally, no wheezing, no rhonchi Abdominal: Soft, NT, ND, bowel sounds + Extremities: no edema, no cyanosis    The results of significant diagnostics from this hospitalization (including imaging, microbiology, ancillary and laboratory) are listed below for reference.     Microbiology: Recent Results (from the past 240 hour(s))  Surgical PCR screen     Status: Abnormal   Collection Time: 01/11/20 10:38 PM   Specimen: Nasal Mucosa; Nasal Swab  Result Value Ref Range Status   MRSA, PCR NEGATIVE NEGATIVE Final   Staphylococcus aureus POSITIVE (A) NEGATIVE Final    Comment: (NOTE) The Xpert SA Assay (FDA approved for NASAL specimens in patients 62 years of age and older), is one component of a comprehensive surveillance program. It is not intended to diagnose infection nor to guide or monitor treatment. Performed at [facility], [place] [address] [place], [place] [address]      Labs: BNP (last 3 results) Recent Labs    10/12/19 0011  BNP 7,026.3*   Basic Metabolic Panel: Recent Labs  Lab 01/12/20 0712 01/14/20 1115 01/15/20 1436 01/16/20 0538 01/18/20 0553  NA 137 137 136 141 136  K 4.0 4.2 4.8 4.5 5.1  CL 103  103 104 105 103  CO2 25 27 25 28 23   GLUCOSE 195* 195* 209* 255* 169*  BUN 25* 17 36* 19 47*  CREATININE 2.48* 2.31* 3.59* 2.22* 4.14*  CALCIUM 8.1* 7.8*  7.9* 8.0* 8.4*  PHOS 1.6* 2.7 3.7 2.9 4.3   Liver Function Tests: Recent Labs  Lab 01/12/20 0712 01/14/20 1115 01/15/20 1436 01/16/20 0538 01/18/20 0553  ALBUMIN 1.6* 1.8* 1.7* 1.8* 2.1*   No results for input(s): LIPASE, AMYLASE in the last 168 hours. No results for input(s): AMMONIA in the last 168 hours. CBC: Recent Labs  Lab 01/14/20 1115 01/15/20 0030 01/16/20 0547 01/17/20 0057 01/18/20 0553  WBC 9.7 9.7 9.0 9.5 9.5  HGB 5.9* 8.3* 8.9* 8.2* 9.9*  HCT 19.6* 25.9* 29.0* 26.8* 31.8*  MCV 94.7 91.5 94.5 94.0 92.4  PLT 116* 124* 122* 116* 144*   Cardiac Enzymes: No results for input(s): CKTOTAL, CKMB, CKMBINDEX, TROPONINI in the last 168 hours. BNP: Invalid input(s): POCBNP CBG: Recent Labs  Lab 01/17/20 0619 01/17/20 1131 01/17/20 1628 01/17/20 2143 01/18/20 0604  GLUCAP 154* 323* 193* 152* 159*   D-Dimer No results for input(s): DDIMER in the last 72 hours. Hgb A1c No results for input(s): HGBA1C in the last 72 hours. Lipid Profile No results for input(s): CHOL, HDL, LDLCALC, TRIG, CHOLHDL, LDLDIRECT in the last 72 hours. Thyroid function studies No results for input(s): TSH, T4TOTAL, T3FREE, THYROIDAB in the last 72 hours.  Invalid input(s): FREET3 Anemia work up No results for input(s): VITAMINB12, FOLATE, FERRITIN, TIBC, IRON, RETICCTPCT in the last 72 hours. Urinalysis    Component Value Date/Time   COLORURINE YELLOW 12/31/2019 1420   APPEARANCEUR HAZY (A) 12/31/2019 1420   LABSPEC 1.010 12/31/2019 1420   PHURINE 5.0 12/31/2019 1420   GLUCOSEU 150 (A) 12/31/2019 1420   HGBUR MODERATE (A) 12/31/2019 1420   BILIRUBINUR NEGATIVE 12/31/2019 1420   KETONESUR NEGATIVE 12/31/2019 1420   PROTEINUR >=300 (A) 12/31/2019 1420   UROBILINOGEN 0.2 08/02/2014 1424   NITRITE NEGATIVE 12/31/2019 1420    LEUKOCYTESUR MODERATE (A) 12/31/2019 1420   Sepsis Labs Invalid input(s): PROCALCITONIN,  WBC,  LACTICIDVEN Microbiology Recent Results (from the past 240 hour(s))  Surgical PCR screen     Status: Abnormal   Collection Time: 01/11/20 10:38 PM   Specimen: Nasal Mucosa; Nasal Swab  Result Value Ref Range Status   MRSA, PCR NEGATIVE NEGATIVE Final   Staphylococcus aureus POSITIVE (A) NEGATIVE Final    Comment: (NOTE) The Xpert SA Assay (FDA approved for NASAL specimens in patients 80 years of age and older), is one component of a comprehensive surveillance program. It is not intended to diagnose infection nor to guide or monitor treatment. Performed at [facility], [place] [address], [place], [place] [address]      Time coordinating discharge:  39 minutes  SIGNED:   Georgette Shell, MD  [facility] 01/18/2020, 10:42 AM

## 2020-01-18 NOTE — Progress Notes (Signed)
[name], MD  Physician  Physical Medicine and Rehabilitation  PMR Pre-admission     Signed  Date of Service:  01/18/2020 11:17 AM      Related encounter: ED to Hosp-Admission (Current) from 12/30/2019 in [place]       Show:Clear all $RemoveBefore'[x]'NtnkRLuYynauS$ Manual$R'[x]'qH$ Templa'[x]'$ Copied  Added by: $RemoveB'[x]'jVoyyBBa$ Cristina Gong, RN$RemoveBeforeDE'[x]'QjrsYiOvBQPpogu$ [name], MD  $R'[]'Ra$ Hover for details PMR Admission Coordinator Pre-Admission Assessment   Patient: Paul [name] is an 64 y.o., male MRN: [id number] DOB: 1955-12-14 Height: 6' (182.9 cm) Weight: 71 kg   Insurance Information HMO:     PPO:      PCP:      IPA:      80/20:      OTHER:  PRIMARY: Medicare a and b      Policy#: [id number]      Subscriber: pt Benefits:  Phone #: passport one online     Name: 8/31 Eff. Date: 10/19/2015     Deduct: $2956      Out of Pocket Max: none      Life Max: none CIR: 100%      SNF: 20 full days Outpatient: 80%     Co-Pay: [place]: 100%      Co-Pay: none DME: 80%     Co-Pay: 20% Providers: pt choice  SECONDARY: [employer]/wife reports COBRA 8/1 until 07/03/863      Policy#: [id number]     Phone#:    THIRD: [employer] [place] [phone] wife reports effective beginning 01/19/2020   I have alerted pre service center   Financial Counselor:       Phone#:    The "Data Collection Information Summary" for patients in Inpatient Rehabilitation Facilities with attached "Privacy Act [place] Records" was provided and verbally reviewed with: Patient and Family   Emergency Contact Information         Contact Information     Name Relation Home Work [place] Spouse [phone]   [phone]         Current Medical History  Patient Admitting Diagnosis: debility   History of Present Illness:     : HPI: Paul [name] is a 64 year old right-handed male with history of renal transplant 2016 with baseline creatinine 2.8-3.4 maintained on chronic prednisone, Prograf as well as  Myfortic, hypertension, insulin-dependent diabetes mellitus, chronic atrial fibrillation on Eliquis, bilateral below-knee amputations receiving inpatient rehab services 08/03/2013 to 08/15/2013 as well as CIR 05/31/2013 to 06/11/2013 for multifactorial gait disorder related to peripheral neuropathy.  Independent with assistive device and prosthesis provided by [employer].  Presented 12/30/2019 with nonspecific pain and rash at the end of right leg stump 3 days prior.  Admission chemistries glucose 269 BUN 80 creatinine 4.76, hemoglobin 10.4 WBC 13,300, lactic acid 0.5, SARS coronavirus negative, urinalysis negative nitrite, CK 56.  CT of right lower extremity showed swelling edema at the distal stump with possible cutaneous blister.  There was more focal hyperdense collection at the posterior lateral aspect of the distal stump measuring approximately 2.6 x 1.7 x 2.6 cm possibly representing hematoma.  MRI of right lower extremity showed no findings of osteomyelitis no drain able abscess.  Renal ultrasound showed no abscess or hydronephrosis.  Placed on vancomycin and cefepime for questionable early cellulitis.  Hospital course bouts of A. fib RVR 120s to 130 blood pressures 90s/60s received fluid bolus follow-up cardiology services and placed on IV amiodarone transition to p.o. as well  as digoxin.  Follow-up renal services for progressive CKD stage IV necessitating need for dialysis initiated 01/04/2020 and arrangements have been made for ongoing outpatient dialysis on discharge with latest creatinine 4.14.  Follow-up Dr. Sharol Given for cellulitis suspect osteomyelitis right below-knee amputation had no change with conservative care and underwent revision right BKA 01/12/2020 and initial placement of wound VAC that was later discontinued.  Patient remains on Eliquis as prior to admission.  Acute on chronic anemia 9.9 and monitored.     Patient's medical record from [facility] has been reviewed by the  rehabilitation admission coordinator and physician.   Past Medical History      Past Medical History:  Diagnosis Date  . A-fib ([place])    . Anemia      esrd  . Calciphylaxis 05/2013    lower ext  . Cancer ([place])      hx duodenal adenoCa 2002, resected  . Closed left arm fracture 1967  . Corneal abrasion, left    . Depression    . Diabetes mellitus      Type 1 iddm x 11 yrs  . Diabetic neuropathy ([place])    . ESRD on hemodialysis ([place])      Pt has ESRD due to DM.  He had a L upper arm AVF prior to starting HD which was ligated due to L arm steal syndrome.  He started HD in Jan 2014 and did home HD.  The family couldn't cannulate the R arm AVF successfully so by mid 2014 they decided to switch to PD which was done late summer 2014.  In Jan 2015 he was admitted with FTT felt to be due to underdialysis on PD and PD was abandoned and he   . GERD (gastroesophageal reflux disease)    . Glomerulosclerosis, diabetic ([place])    . Heart murmur    . History of blood transfusion    . Hypertension      off of meds due to orthostatic hypotension  . Kidney transplant recipient    . Open wound of both legs with complication      Pt has had progressive wounds of both LE's including gangrene of the toes and patchy necrosis of the calves.  He has been treated at [facility] by Dr Jerline Pain with hyperbaric O2 5d per week.  He is getting Na Thiosulfate with HD for suspected calciphylaxis. Pt says doctor's aren't sure if these ulcers were diabetic ulcers or calciphylaxis.  He underwent bilat BKA on 07/30/13.  He says that the woun      Family History   family history includes Cancer in his father and mother; Heart disease in his maternal grandfather; Stroke in his paternal grandmother.   Prior Rehab/Hospitalizations Has the patient had prior rehab or hospitalizations prior to admission? Yes CIR in 2015 twice   Has the patient had major surgery during 100 days prior to admission? Yes              Current  Medications   Current Facility-Administered Medications:  .  0.9 %  sodium chloride infusion, , Intravenous, Continuous, Persons, Bevely Palmer, [place], Last Rate: 10 mL/hr at 01/16/20 2116, 10 mL/hr at 01/16/20 2116 .  acetaminophen (TYLENOL) tablet 650 mg, 650 mg, Oral, Q6H PRN, 650 mg at 01/09/20 1928 **OR** acetaminophen (TYLENOL) suppository 650 mg, 650 mg, Rectal, Q6H PRN, Persons, Bevely Palmer, PA .  [COMPLETED] amiodarone (PACERONE) tablet 400 mg, 400 mg, Oral, BID, 400 mg at 01/12/20 2116 **FOLLOWED BY**  amiodarone (PACERONE) tablet 200 mg, 200 mg, Oral, Daily, Persons, Bevely Palmer, [place], 200 mg at 01/18/20 0902 .  amLODipine (NORVASC) tablet 5 mg, 5 mg, Oral, Daily, Persons, Bevely Palmer, [place], 5 mg at 01/18/20 0902 .  apixaban (ELIQUIS) tablet 5 mg, 5 mg, Oral, BID, Kris Mouton, RPH, 5 mg at 01/18/20 8338 .  aspirin EC tablet 81 mg, 81 mg, Oral, Daily, Persons, Bevely Palmer, [place], 81 mg at 01/18/20 0904 .  atorvastatin (LIPITOR) tablet 10 mg, 10 mg, Oral, Q M,W,F, Persons, Bevely Palmer, PA, 10 mg at 01/17/20 1128 .  bisacodyl (DULCOLAX) EC tablet 10 mg, 10 mg, Oral, Daily, Georgette Shell, MD, 10 mg at 01/18/20 0902 .  Chlorhexidine Gluconate Cloth 2 % PADS 6 each, 6 each, Topical, Q0600, Persons, Bevely Palmer, [place], 6 each at 01/18/20 [phone] .  Chlorhexidine Gluconate Cloth 2 % PADS 6 each, 6 each, Topical, Q0600, Edrick Oh, MD .  cholecalciferol (VITAMIN D3) tablet 4,000 Units, 4,000 Units, Oral, Daily, Persons, Bevely Palmer, [place], 4,000 Units at 01/18/20 0902 .  Darbepoetin Alfa (ARANESP) injection 60 mcg, 60 mcg, Intravenous, Q Thu-HD, Edrick Oh, MD, 60 mcg at 01/06/20 1821 .  docusate sodium (COLACE) capsule 100 mg, 100 mg, Oral, BID, Persons, Bevely Palmer, PA, 100 mg at 01/18/20 0902 .  hydrALAZINE (APRESOLINE) tablet 25 mg, 25 mg, Oral, Q6H PRN, Persons, Bevely Palmer, PA .  insulin aspart (novoLOG) injection 0-9 Units, 0-9 Units, Subcutaneous, TID WC, Persons, Bevely Palmer, [place], 2 Units at 01/18/20 [phone] .  insulin  aspart (novoLOG) injection 2 Units, 2 Units, Subcutaneous, TID WC, Persons, Bevely Palmer, [place], 2 Units at 01/18/20 [phone] .  insulin glargine (LANTUS) injection 8 Units, 8 Units, Subcutaneous, Daily, Georgette Shell, MD, 8 Units at 01/18/20 [phone] .  lipase/protease/amylase (CREON) capsule 12,000 Units, 12,000 Units, Oral, TID AC, Persons, Bevely Palmer, [place], 12,000 Units at 01/18/20 0901 .  magnesium chloride (SLOW-MAG) 64 MG SR tablet 128 mg, 2 tablet, Oral, BID, Persons, Bevely Palmer, [place], 128 mg at 01/18/20 0903 .  mycophenolate (MYFORTIC) EC tablet 180 mg, 180 mg, Oral, BID, Persons, Bevely Palmer, [place], 180 mg at 01/18/20 0902 .  ondansetron (ZOFRAN) injection 4 mg, 4 mg, Intravenous, Q6H PRN, Persons, Bevely Palmer, PA, 4 mg at 01/13/20 1340 .  oxyCODONE (Oxy IR/ROXICODONE) immediate release tablet 5-10 mg, 5-10 mg, Oral, Q4H PRN, Persons, Bevely Palmer, PA .  pantoprazole (PROTONIX) EC tablet 40 mg, 40 mg, Oral, Daily, Persons, Bevely Palmer, [place], 40 mg at 01/18/20 0902 .  phenylephrine (NEO-SYNEPHRINE) 0.5 % nasal solution 1 drop, 1 drop, Each Nare, Q6H PRN, Persons, Bevely Palmer, PA .  polyethylene glycol (MIRALAX / GLYCOLAX) packet 17 g, 17 g, Oral, Daily, Georgette Shell, MD, 17 g at 01/18/20 0240 .  predniSONE (DELTASONE) tablet 5 mg, 5 mg, Oral, Q breakfast, Persons, Bevely Palmer, [place], 5 mg at 01/18/20 0903 .  sertraline (ZOLOFT) tablet 50 mg, 50 mg, Oral, Daily, Persons, Bevely Palmer, [place], 50 mg at 01/18/20 0902 .  tacrolimus (PROGRAF) capsule 2 mg, 2 mg, Oral, BID, Persons, Bevely Palmer, [place], 2 mg at 01/18/20 0904 .  tamsulosin (FLOMAX) capsule 0.4 mg, 0.4 mg, Oral, QPC supper, Persons, Bevely Palmer, PA, 0.4 mg at 01/17/20 1736   Patients Current Diet:     Diet Order                      Diet - low sodium heart healthy  Diet Carb Modified Fluid consistency: Thin; Room service appropriate? Yes  Diet effective now                      Precautions / Restrictions Precautions Precautions: Fall Precaution  Comments: bil BKAs; wound vac; no pillow under R knee Restrictions Weight Bearing Restrictions: No RLE Weight Bearing: Non weight bearing    Has the patient had 2 or more falls or a fall with injury in the past year? No   Prior Activity Level Limited Community (1-2x/wk): Mod I with RW, prosthesis and adaptive equipment   Prior Functional Level Self Care: Did the patient need help bathing, dressing, using the toilet or eating? Independent   Indoor Mobility: Did the patient need assistance with walking from room to room (with or without device)? Independent   Stairs: Did the patient need assistance with internal or external stairs (with or without device)? Independent   Functional Cognition: Did the patient need help planning regular tasks such as shopping or remembering to take medications? Independent   Home Equities trader / Equipment Home Assistive Devices/Equipment: CBG Meter, Wheelchair, Eyeglasses, [place] (specify quad or straight), Environmental consultant (specify type) Home Equipment: Kasandra Knudsen - single point, [employer], Wheelchair - manual, Crutches, Environmental consultant - 2 wheels, Hand held shower head, Grab bars - toilet, Shower seat - built in   Prior Device Use: Indicate devices/aids used by the patient prior to current illness, exacerbation or injury? Walker and Orthotics/Prosthetics   Current Functional Level Cognition   Overall Cognitive Status: Impaired/Different from baseline Current Attention Level: Selective Orientation Level: Oriented X4 Safety/Judgement: Decreased awareness of deficits, Decreased awareness of safety General Comments: pt unaware of incontinent stool, difficulty with recent events and decreased awareness of current function    Extremity Assessment (includes Sensation/Coordination)   Upper Extremity Assessment: RUE deficits/detail, LUE deficits/detail RUE Deficits / Details: generalized weakness; significant wasting of intrinsics; drops items occasionally RUE Sensation:  decreased light touch RUE Coordination: decreased fine motor LUE Deficits / Details: generalized weakness  Lower Extremity Assessment: Defer to PT evaluation RLE Deficits / Details: Limited knee extension AROM     ADLs   Overall ADL's : Needs assistance/impaired Grooming: Set up, Sitting Upper Body Bathing: Set up, Sitting, Bed level Lower Body Bathing: Moderate assistance, Bed level Upper Body Dressing : Minimal assistance, Bed level Lower Body Dressing: Moderate assistance, Bed level Toileting- Clothing Manipulation and Hygiene: Moderate assistance, Bed level Functional mobility during ADLs:  (declined OOB) General ADL Comments: limited today by fatigue/low Hgb     Mobility   Overal bed mobility: Needs Assistance Bed Mobility: Supine to Sit, Sidelying to Sit, Sit to Sidelying Rolling: Supervision (heavy use of rails) Sidelying to sit: Mod assist Supine to sit: Min assist, HOB elevated Sit to sidelying: Min guard General bed mobility comments: pt able to transition from supine to sit with HOB 30 degrees with only guarding. In sitting with pivot in bed noted large incontinent stool with pt assisted to sidelying for pericare then mod assist to return to sitting from sidelying     Transfers   Overall transfer level: Needs assistance Transfers: Lateral/Scoot Transfers  Lateral/Scoot Transfers: Min assist General transfer comment: pt able to initiate scooting toward chair with drop arm lateral scoot but required assist of pad and cues to complete transition fully from bed to chair     Ambulation / Gait / Stairs / Wheelchair Mobility   Ambulation/Gait General Gait Details: unable     Posture / Balance Dynamic  Sitting Balance Sitting balance - Comments: Initially requiring Max A with BUE support progressing to 1 UE support progressing to no UE support. Max A progressing to close Min guard. Worked on reaching outside [employer] in all directions with [place]. Balance Overall balance assessment:  Needs assistance Sitting-balance support: Single extremity supported, No upper extremity supported Sitting balance-Leahy Scale: Poor Sitting balance - Comments: Initially requiring Max A with BUE support progressing to 1 UE support progressing to no UE support. Max A progressing to close Min guard. Worked on reaching outside [employer] in all directions with [place]. Postural control: Posterior lean     Special needs/care consideration  Designated visitor is wife, [name] Began hemodialysis this admission  Arranged for TTS at [facility] dialysis center      Previous Home Environment  Living Arrangements: Spouse/significant other  Lives With: Spouse Available Help at Discharge: Family, Available 24 hours/day Type of Home: House Home Layout: Two level, Able to live on main level with bedroom/bathroom Alternate Level Stairs-Rails: None Alternate Level Stairs-Number of Steps: 1 step down into the living room Home Access: Ramped entrance Bathroom Shower/Tub: Multimedia programmer: Standard Bathroom Accessibility: Yes How Accessible: Accessible via wheelchair, Accessible via [place]: No Additional Comments: was doing outpatient therapy   Discharge Living Setting Plans for Discharge Living Setting: Patient's home, Lives with (comment) (wife) Type of Home at Discharge: House Discharge Home Layout: Two level, Able to live on main level with bedroom/bathroom Alternate Level Stairs-Number of Steps: 1 step down into living room Discharge Home Access: [place] entrance Discharge Bathroom Shower/Tub: Walk-in shower Discharge Bathroom Toilet: Standard Discharge Bathroom Accessibility: Yes How Accessible: Accessible via wheelchair, Accessible via walker Does the patient have any problems obtaining your medications?: No   Social/Family/Support Systems Contact Information: wife, [name] Anticipated Caregiver: wife Anticipated Ambulance person Information: see  above Ability/Limitations of Caregiver: no limitations Caregiver Availability: 24/7 Discharge Plan Discussed with Primary Caregiver: Yes Is Caregiver In Agreement with Plan?: Yes Does Caregiver/Family have Issues with Lodging/Transportation while Pt is in Rehab?: No   Goals Patient/Family Goal for Rehab: Mod I to supervision with PT and OT at wheelchair level Expected length of stay: ELOS 7 to 10 days Additional Information: Hisotry of renal transplant. New to hemodialysis this admission Pt/Family Agrees to Admission and willing to participate: Yes Program Orientation Provided & Reviewed with Pt/Caregiver Including Roles  & Responsibilities: Yes   Decrease burden of Care through IP rehab admission: n/a   Possible need for SNF placement upon discharge: not anticipated   Patient Condition: I have reviewed medical records from [facility] , spoken with CM, and patient and spouse. I met with patient at the bedside for inpatient rehabilitation assessment.  Patient will benefit from ongoing PT and OT, can actively participate in 3 hours of therapy a day 5 days of the week, and can make measurable gains during the admission.  Patient will also benefit from the coordinated team approach during an Inpatient Acute Rehabilitation admission.  The patient will receive intensive therapy as well as Rehabilitation physician, nursing, social worker, and care management interventions.  Due to bladder management, bowel management, safety, skin/wound care, disease management, medication administration, pain management and patient education the patient requires 24 hour a day rehabilitation nursing.  The patient is currently min assist  with mobility and basic ADLs.  Discharge setting and therapy post discharge at home with outpatient is anticipated.  Patient has agreed to participate in the Acute Inpatient Rehabilitation Program and will admit  today.   Preadmission Screen Completed By:  Cleatrice Burke, 01/18/2020 11:17 AM ______________________________________________________________________   Discussed status with Dr. Dagoberto Ligas  on  01/18/2020 at  1128 and received approval for admission today.   Admission Coordinator:  Cleatrice Burke, RN, time 0160 Date  01/18/2020    Assessment/Plan: Diagnosis: 1. Does the need for close, 24 hr/day Medical supervision in concert with the patient's rehab needs make it unreasonable for this patient to be served in a less intensive setting? Yes 2. Co-Morbidities requiring supervision/potential complications: B/L BKAs, ESRD s/p renal transplant- on Vanc/Cefipime, Dm- A1c 10.3; Afib,  3. Due to bowel management, safety, skin/wound care, disease management, medication administration, pain management and patient education, does the patient require 24 hr/day rehab nursing? Yes 4. Does the patient require coordinated care of a physician, rehab nurse, PT, OT, and SLP to address physical and functional deficits in the context of the above medical diagnosis(es)? Yes Addressing deficits in the following areas: balance, endurance, locomotion, strength, transferring, bathing, dressing, feeding, grooming and toileting 5. Can the patient actively participate in an intensive therapy program of at least 3 hrs of therapy 5 days a week? Yes 6. The potential for patient to make measurable gains while on inpatient rehab is fair 7. Anticipated functional outcomes upon discharge from inpatient rehab: modified independent and supervision PT, modified independent and supervision OT, n/a SLP 8. Estimated rehab length of stay to reach the above functional goals is: 7-10 days 9. Anticipated discharge destination: Home 10. Overall Rehab/Functional Prognosis: fair     MD Signature:          Revision History                     Note Details  Jan Fireman, MD File Time 01/18/2020 11:58 AM  Author Type Physician Status Signed  Last Editor [name], MD  Service Physical Medicine and Rehabilitation

## 2020-01-18 NOTE — Progress Notes (Addendum)
Patient is status post below-knee amputation revision.  Overall he is doing well.  He is getting ready to be transferred to rehab.  His wound VAC was discontinued because of excessive bleeding.  Bulky dressing was removed there was a significant amount of dried blood there was some bleeding on the lateral side of the wound.  Overall well opposed wound edges and swelling was well controlled.   Patient should have a dressing change straight daily with dry dressing.  Also wound stump can be cleansed daily with antibacterial soap and water patient will follow up in 1 week after discharge

## 2020-01-19 ENCOUNTER — Inpatient Hospital Stay (HOSPITAL_COMMUNITY): Payer: Managed Care, Other (non HMO) | Admitting: Occupational Therapy

## 2020-01-19 ENCOUNTER — Inpatient Hospital Stay (HOSPITAL_COMMUNITY): Payer: Managed Care, Other (non HMO)

## 2020-01-19 ENCOUNTER — Other Ambulatory Visit: Payer: Self-pay

## 2020-01-19 ENCOUNTER — Ambulatory Visit: Payer: Managed Care, Other (non HMO) | Admitting: Physical Therapy

## 2020-01-19 ENCOUNTER — Encounter (HOSPITAL_COMMUNITY): Payer: Self-pay | Admitting: Physical Medicine & Rehabilitation

## 2020-01-19 DIAGNOSIS — L89152 Pressure ulcer of sacral region, stage 2: Secondary | ICD-10-CM | POA: Diagnosis present

## 2020-01-19 DIAGNOSIS — D179 Benign lipomatous neoplasm, unspecified: Secondary | ICD-10-CM | POA: Diagnosis present

## 2020-01-19 DIAGNOSIS — Z89511 Acquired absence of right leg below knee: Secondary | ICD-10-CM | POA: Diagnosis not present

## 2020-01-19 DIAGNOSIS — N186 End stage renal disease: Secondary | ICD-10-CM | POA: Diagnosis present

## 2020-01-19 DIAGNOSIS — K219 Gastro-esophageal reflux disease without esophagitis: Secondary | ICD-10-CM | POA: Diagnosis present

## 2020-01-19 DIAGNOSIS — E1022 Type 1 diabetes mellitus with diabetic chronic kidney disease: Secondary | ICD-10-CM | POA: Diagnosis present

## 2020-01-19 DIAGNOSIS — E1042 Type 1 diabetes mellitus with diabetic polyneuropathy: Secondary | ICD-10-CM | POA: Diagnosis present

## 2020-01-19 DIAGNOSIS — E785 Hyperlipidemia, unspecified: Secondary | ICD-10-CM | POA: Diagnosis present

## 2020-01-19 DIAGNOSIS — Z8042 Family history of malignant neoplasm of prostate: Secondary | ICD-10-CM | POA: Diagnosis not present

## 2020-01-19 DIAGNOSIS — D62 Acute posthemorrhagic anemia: Secondary | ICD-10-CM | POA: Diagnosis present

## 2020-01-19 DIAGNOSIS — D638 Anemia in other chronic diseases classified elsewhere: Secondary | ICD-10-CM | POA: Diagnosis not present

## 2020-01-19 DIAGNOSIS — E1142 Type 2 diabetes mellitus with diabetic polyneuropathy: Secondary | ICD-10-CM | POA: Diagnosis not present

## 2020-01-19 DIAGNOSIS — I480 Paroxysmal atrial fibrillation: Secondary | ICD-10-CM | POA: Diagnosis not present

## 2020-01-19 DIAGNOSIS — T8612 Kidney transplant failure: Secondary | ICD-10-CM | POA: Diagnosis present

## 2020-01-19 DIAGNOSIS — Z89512 Acquired absence of left leg below knee: Secondary | ICD-10-CM | POA: Diagnosis not present

## 2020-01-19 DIAGNOSIS — I120 Hypertensive chronic kidney disease with stage 5 chronic kidney disease or end stage renal disease: Secondary | ICD-10-CM | POA: Diagnosis present

## 2020-01-19 DIAGNOSIS — N179 Acute kidney failure, unspecified: Secondary | ICD-10-CM | POA: Diagnosis present

## 2020-01-19 DIAGNOSIS — D631 Anemia in chronic kidney disease: Secondary | ICD-10-CM | POA: Diagnosis present

## 2020-01-19 DIAGNOSIS — N2581 Secondary hyperparathyroidism of renal origin: Secondary | ICD-10-CM | POA: Diagnosis present

## 2020-01-19 DIAGNOSIS — Z4781 Encounter for orthopedic aftercare following surgical amputation: Secondary | ICD-10-CM | POA: Diagnosis present

## 2020-01-19 LAB — GLUCOSE, CAPILLARY
Glucose-Capillary: 140 mg/dL — ABNORMAL HIGH (ref 70–99)
Glucose-Capillary: 267 mg/dL — ABNORMAL HIGH (ref 70–99)
Glucose-Capillary: 80 mg/dL (ref 70–99)
Glucose-Capillary: 71 mg/dL (ref 70–99)

## 2020-01-19 MED ORDER — CHLORHEXIDINE GLUCONATE CLOTH 2 % EX PADS
6.0000 | MEDICATED_PAD | Freq: Every day | CUTANEOUS | Status: DC
  Administered 2020-01-19 – 2020-01-21 (×3): 6 via TOPICAL

## 2020-01-19 NOTE — Progress Notes (Signed)
Patient ID: Paul [name], male   DOB: 11-15-1955, 64 y.o.   MRN: [id number]   Team Conference Report to Patient/Family  Team Conference discussion was reviewed with the patient and caregiver, including goals, any changes in plan of care and target discharge date.  Patient and caregiver express understanding and are in agreement.  The patient has a target discharge date of  (7-10 days ELOS).  Dyanne Iha 01/19/2020, 1:42 PM

## 2020-01-19 NOTE — Progress Notes (Addendum)
[facility] ROUNDING NOTE   Subjective:   Brief narrative: 64 year old gentleman with a history of renal transplant 2016 CKD stage IV, hypertension insulin-dependent diabetes atrial fibrillation status post bilateral BKA secondary to PVD with new onset right BKA stump pain.  Patient was admitted 12/30/2019.  Underwent stump revision for right stump osteomyelitis and abscess 01/12/2020  Baseline creatinine 2.8 to 3.4 mg/dL.  Started dialysis during admission for progression of his renal disease to end-stage renal disease.  There has been no signs of renal recovery.  He has been clipped to the outpatient dialysis center of [facility].  He will continue on a TTS schedule.  He had a successful dialysis treatment 01/18/2020 with 2.8 L removed.  His next dialysis will be planned for 01/20/2020  Blood pressure 147/63 pulse 60 temperature 98.1 O2 sats 97% room air urine output 450 cc 01/16/2020  Labs: Pending this a.m. we will order daily renal panel and CBC  Amiodarone 200 mg daily, amlodipine 5 mg daily, aspirin 81 mg daily, atorvastatin 10 mg daily, Eliquis 5 mg twice daily, vitamin D3 4000 units daily, Aranesp 60 mcg every Thursday  , insulin sliding scale, insulin Lantus 8 units daily, mycophenolate 180 mg twice daily, Protonix 40 mg daily, prednisone 5 mg daily, Zoloft 50 mg daily, tacrolimus 2 mg twice daily, Flomax 0.4 mg daily.  Creon 12,000 units 3 times daily   Objective:  Vital signs in last 24 hours:  Temp:  [98 F (36.7 C)-98.7 F (37.1 C)] 98.1 F (36.7 C) (09/01 0546) Pulse Rate:  [60-67] 60 (09/01 0546) Resp:  [15-20] 16 (09/01 0546) BP: (103-165)/(60-80) 147/63 (09/01 0546) SpO2:  [97 %] 97 % (09/01 0546) Weight:  [69.3 kg-71.9 kg] 69.3 kg (08/31 2242)  Weight change:  Filed Weights   01/18/20 2242  Weight: 69.3 kg    Intake/Output: No intake/output data recorded.   Intake/Output this shift:  No intake/output data recorded.  General: Not in  distress, comfortable Heart:RRR, s1s2 nl, no rub Lungs: Clear b/l, no crackle Abdomen:soft, Non-tender, non-distended Extremities: Bilateral BKA, right BKA site has bandage applied. Dialysis Access: Right AV fistula has good thrill and bruit   Basic Metabolic Panel: Recent Labs  Lab 01/12/20 0712 01/12/20 0712 01/14/20 1115 01/14/20 1115 01/15/20 1436 01/16/20 0538 01/18/20 0553  NA 137  --  137  --  136 141 136  K 4.0  --  4.2  --  4.8 4.5 5.1  CL 103  --  103  --  104 105 103  CO2 25  --  27  --  25 28 23   GLUCOSE 195*  --  195*  --  209* 255* 169*  BUN 25*  --  17  --  36* 19 47*  CREATININE 2.48*  --  2.31*  --  3.59* 2.22* 4.14*  CALCIUM 8.1*   < > 7.8*   < > 7.9* 8.0* 8.4*  PHOS 1.6*  --  2.7  --  3.7 2.9 4.3   < > = values in this interval not displayed.    Liver Function Tests: Recent Labs  Lab 01/12/20 0712 01/14/20 1115 01/15/20 1436 01/16/20 0538 01/18/20 0553  ALBUMIN 1.6* 1.8* 1.7* 1.8* 2.1*   No results for input(s): LIPASE, AMYLASE in the last 168 hours. No results for input(s): AMMONIA in the last 168 hours.  CBC: Recent Labs  Lab 01/14/20 1115 01/15/20 0030 01/16/20 0547 01/17/20 0057 01/18/20 0553  WBC 9.7 9.7 9.0 9.5 9.5  HGB 5.9*  8.3* 8.9* 8.2* 9.9*  HCT 19.6* 25.9* 29.0* 26.8* 31.8*  MCV 94.7 91.5 94.5 94.0 92.4  PLT 116* 124* 122* 116* 144*    Cardiac Enzymes: No results for input(s): CKTOTAL, CKMB, CKMBINDEX, TROPONINI in the last 168 hours.  BNP: Invalid input(s): POCBNP  CBG: Recent Labs  Lab 01/17/20 1628 01/17/20 2143 01/18/20 0604 01/18/20 1123 01/19/20 0632  GLUCAP 193* 152* 159* 131* 140*    Microbiology: Results for orders placed or performed during the hospital encounter of 12/30/19  SARS Coronavirus 2 by RT PCR (hospital order, performed in [facility] hospital lab) Nasopharyngeal Nasopharyngeal Swab     Status: None   Collection Time: 12/31/19 11:15 AM   Specimen: Nasopharyngeal Swab  Result Value Ref  Range Status   SARS Coronavirus 2 NEGATIVE NEGATIVE Final    Comment: (NOTE) SARS-CoV-2 target nucleic acids are NOT DETECTED.  The SARS-CoV-2 RNA is generally detectable in upper and lower respiratory specimens during the acute phase of infection. The lowest concentration of SARS-CoV-2 viral copies this assay can detect is 250 copies / mL. A negative result does not preclude SARS-CoV-2 infection and should not be used as the sole basis for treatment or other patient management decisions.  A negative result may occur with improper specimen collection / handling, submission of specimen other than nasopharyngeal swab, presence of viral mutation(s) within the areas targeted by this assay, and inadequate number of viral copies (<250 copies / mL). A negative result must be combined with clinical observations, patient history, and epidemiological information.  Fact Sheet for Patients:   StrictlyIdeas.no  Fact Sheet for Healthcare Providers: BankingDealers.co.za  This test is not yet approved or  cleared by the [place] FDA and has been authorized for detection and/or diagnosis of SARS-CoV-2 by FDA under an Emergency Use Authorization (EUA).  This EUA will remain in effect (meaning this test can be used) for the duration of the COVID-19 declaration under Section 564(b)(1) of the Act, 21 U.S.C. section 360bbb-3(b)(1), unless the authorization is terminated or revoked sooner.  Performed at [facility], [place] [address], [place], [place] [address]   Surgical PCR screen     Status: Abnormal   Collection Time: 01/11/20 10:38 PM   Specimen: Nasal Mucosa; Nasal Swab  Result Value Ref Range Status   MRSA, PCR NEGATIVE NEGATIVE Final   Staphylococcus aureus POSITIVE (A) NEGATIVE Final    Comment: (NOTE) The Xpert SA Assay (FDA approved for NASAL specimens in patients 93 years of age and older), is one component of a  comprehensive surveillance program. It is not intended to diagnose infection nor to guide or monitor treatment. Performed at [facility], [place] [address], [place], [place] [address]     Coagulation Studies: No results for input(s): LABPROT, INR in the last 72 hours.  Urinalysis: No results for input(s): COLORURINE, LABSPEC, PHURINE, GLUCOSEU, HGBUR, BILIRUBINUR, KETONESUR, PROTEINUR, UROBILINOGEN, NITRITE, LEUKOCYTESUR in the last 72 hours.  Invalid input(s): APPERANCEUR    Imaging: No results found.   Medications:    . amiodarone  200 mg Oral Daily  . amLODipine  5 mg Oral Daily  . apixaban  5 mg Oral BID  . aspirin EC  81 mg Oral Daily  . atorvastatin  10 mg Oral Q M,W,F  . bisacodyl  10 mg Oral Daily  . cholecalciferol  4,000 Units Oral Daily  . [START ON 01/20/2020] darbepoetin (ARANESP) injection - DIALYSIS  60 mcg Intravenous Q Thu-HD  . docusate sodium  100 mg Oral BID  . insulin aspart  0-9 Units Subcutaneous TID WC  . insulin aspart  2 Units Subcutaneous TID WC  . insulin glargine  8 Units Subcutaneous Daily  . lipase/protease/amylase  12,000 Units Oral TID AC  . magnesium chloride  2 tablet Oral BID  . mycophenolate  180 mg Oral BID  . pantoprazole  40 mg Oral Daily  . polyethylene glycol  17 g Oral Daily  . predniSONE  5 mg Oral Q breakfast  . sertraline  50 mg Oral Daily  . tacrolimus  2 mg Oral BID  . tamsulosin  0.4 mg Oral QPC supper   acetaminophen **OR** acetaminophen, oxyCODONE  Assessment/ Plan:  1. Acute kidney injury on CKD4, now progressed to ESRD: BL cr 2.8-3.4 s/p DDRT with DGF complicated by ACR and antibody mediated rejection requiring IVIG as recently as 2018.  Started dialysis 01/04/2020   No sign of renal recovery therefore we will continue TTS schedule for dialysis.  He has outpatient dialysis set up for TTS at [facility]. Patient has functional dialysis access.  Status post dialysis 01/18/2020 with 2.3 L ultrafiltered no  complications.  Next dialysis treatment will be planned for 01/20/2020  2. Kidney transplant: Continue tacrolimus, mycophenolate, prednisone-tac tapered to 2/2 on 8/18->mycophenolate tapered to 180 mg twice daily on 8/22.  Pharmacy ordering tacrolimus level since patient is on amiodarone, follow-up.  Last tacrolimus level was less than 1 12/31/2019  3. Hypertension/volume: Continue amlodipine.  Managing volume with dialysis.  4. Secondary hyperparathyroidism/hyperphosphatemia: PTH level 43.  Phosphorus level improving, off binders, monitor lab.  5. Anemia of CKD, acute blood loss anemia: Received blood transfusion. Continue Aranesp  Iron saturation 81 Monitor hemoglobin and transfuse as needed.  6. Severe sepsis due to right leg osteomyelitis/abscess on BKA stump site: Treated with antibiotics.  Status post revision of right below-knee amputation by Dr. Sharol Given on 8/25.    Transfer to CIR   LOS: [place] @TODAY @6 :34 AM

## 2020-01-19 NOTE — Plan of Care (Signed)
°  Problem: Consults Goal: RH GENERAL PATIENT EDUCATION Description: See Patient Education module for education specifics. Outcome: Progressing Goal: Skin Care Protocol Initiated - if Braden Score 18 or less Description: If consults are not indicated, leave blank or document N/A Outcome: Progressing Goal: Nutrition Consult-if indicated Outcome: Progressing Goal: Diabetes Guidelines if Diabetic/Glucose > 140 Description: If diabetic or lab glucose is > 140 mg/dl - Initiate Diabetes/Hyperglycemia Guidelines & Document Interventions  Outcome: Progressing   Problem: RH BOWEL ELIMINATION Goal: RH STG MANAGE BOWEL WITH ASSISTANCE Description: STG Manage Bowel with mod I Assistance. Outcome: Progressing   Problem: RH BLADDER ELIMINATION Goal: RH STG MANAGE BLADDER WITH ASSISTANCE Description: STG Manage Bladder With mod I Assistance Outcome: Progressing   Problem: RH SKIN INTEGRITY Goal: RH STG SKIN FREE OF INFECTION/BREAKDOWN Outcome: Progressing Goal: RH STG MAINTAIN SKIN INTEGRITY WITH ASSISTANCE Description: STG Maintain Skin Integrity With mod I Assistance. Outcome: Progressing Goal: RH STG ABLE TO PERFORM INCISION/WOUND CARE W/ASSISTANCE Description: STG Able To Perform Incision/Wound Care With mod I Assistance. Outcome: Progressing   Problem: RH SAFETY Goal: RH STG ADHERE TO SAFETY PRECAUTIONS W/ASSISTANCE/DEVICE Description: STG Adhere to Safety Precautions With mod I Assistance/Device. Outcome: Progressing   Problem: RH PAIN MANAGEMENT Goal: RH STG PAIN MANAGED AT OR BELOW PT'S PAIN GOAL Outcome: Progressing   Problem: RH KNOWLEDGE DEFICIT GENERAL Goal: RH STG INCREASE KNOWLEDGE OF SELF CARE AFTER HOSPITALIZATION Outcome: Progressing

## 2020-01-19 NOTE — Evaluation (Signed)
Occupational Therapy Assessment and Plan  Patient Details  Name: Paul [name] MRN: [id number] Date of Birth: 12/25/1955  OT Diagnosis: abnormal posture, acute pain and muscle weakness (generalized) Rehab Potential: Rehab Potential (ACUTE ONLY): Good ELOS: 7-10 days   Today's Date: 01/19/2020 OT Individual Time: 4098-1191 OT Individual Time Calculation (min): 76 min     Hospital Problem: Active Problems:   Right below-knee amputee [facility])   Past Medical History:  Past Medical History:  Diagnosis Date  . A-fib ([place])   . Anemia    esrd  . Calciphylaxis 05/2013   lower ext  . Cancer ([place])    hx duodenal adenoCa 2002, resected  . Closed left arm fracture 1967  . Corneal abrasion, left   . Depression   . Diabetes mellitus    Type 1 iddm x 11 yrs  . Diabetic neuropathy ([place])   . ESRD on hemodialysis ([place])    Pt has ESRD due to DM.  He had a L upper arm AVF prior to starting HD which was ligated due to L arm steal syndrome.  He started HD in Jan 2014 and did home HD.  The family couldn't cannulate the R arm AVF successfully so by mid 2014 they decided to switch to PD which was done late summer 2014.  In Jan 2015 he was admitted with FTT felt to be due to underdialysis on PD and PD was abandoned and he   . GERD (gastroesophageal reflux disease)   . Glomerulosclerosis, diabetic ([place])   . Heart murmur   . History of blood transfusion   . Hypertension    off of meds due to orthostatic hypotension  . Kidney transplant recipient   . Open wound of both legs with complication    Pt has had progressive wounds of both LE's including gangrene of the toes and patchy necrosis of the calves.  He has been treated at [facility] by Dr Jerline Pain with hyperbaric O2 5d per week.  He is getting Na Thiosulfate with HD for suspected calciphylaxis. Pt says doctor's aren't sure if these ulcers were diabetic ulcers or calciphylaxis.  He underwent bilat BKA on 07/30/13.  He says that the woun   Past Surgical  History:  Past Surgical History:  Procedure Laterality Date  . AMPUTATION Bilateral 07/30/2013   Procedure: AMPUTATION BELOW KNEE;  Surgeon: Newt Minion, MD;  Location: [place];  Service: Orthopedics;  Laterality: Bilateral;  Bilateral Below Knee Amputations  . AMPUTATION Bilateral 09/03/2013   Procedure: AMPUTATION BELOW KNEE;  Surgeon: Newt Minion, MD;  Location: [place];  Service: Orthopedics;  Laterality: Bilateral;  Bilateral Below Knee Amputation Revision  . AV FISTULA PLACEMENT  06/07/2011   Procedure: ARTERIOVENOUS (AV) FISTULA CREATION;  Surgeon: Angelia Mould, MD;  Location: [place];  Service: Vascular;  Laterality: Left;  . AV FISTULA PLACEMENT  06/18/2012   Procedure: ARTERIOVENOUS (AV) FISTULA CREATION;  Surgeon: Mal Misty, MD;  Location: [place];  Service: Vascular;  Laterality: Right;  [place]  . BILIARY DIVERSION, EXTERNAL  2002  . BONE MARROW BIOPSY  2012  . CAPD INSERTION N/A 01/14/2013   Procedure: LAPAROSCOPIC INSERTION CONTINUOUS AMBULATORY PERITONEAL DIALYSIS  (CAPD) CATHETER;  Surgeon: Adin Hector, MD;  Location: [place];  Service: General;  Laterality: N/A;  . CAPD REMOVAL N/A 07/09/2013   Procedure: CONTINUOUS AMBULATORY PERITONEAL DIALYSIS  (CAPD) CATHETER REMOVAL;  Surgeon: Adin Hector, MD;  Location: [place];  Service: General;  Laterality: N/A;  .  COLONOSCOPY     Hx: of  . INSERTION OF DIALYSIS CATHETER  05/22/2012   Procedure: INSERTION OF DIALYSIS CATHETER;  Surgeon: Mal Misty, MD;  Location: [place];  Service: Vascular;  Laterality: N/A;  right internal jugular vein  . IR ANGIO INTRA EXTRACRAN SEL COM CAROTID INNOMINATE BILAT MOD SED  10/28/2018  . IR ANGIO VERTEBRAL SEL SUBCLAVIAN INNOMINATE UNI R MOD SED  10/28/2018  . IR ANGIO VERTEBRAL SEL VERTEBRAL UNI L MOD SED  10/28/2018  . LIGATION OF ARTERIOVENOUS  FISTULA  05/22/2012   Procedure: LIGATION OF ARTERIOVENOUS  FISTULA;  Surgeon: Mal Misty, MD;  Location: [place];  Service: Vascular;  Laterality:  Left;  Left brachio-cephalic fistula  . LOWER EXTREMITY ANGIOGRAM N/A 06/14/2013   Procedure: LOWER EXTREMITY ANGIOGRAM;  Surgeon: Angelia Mould, MD;  Location: [facility] CATH LAB;  Service: Cardiovascular;  Laterality: N/A;  . NEPHRECTOMY TRANSPLANTED ORGAN    . OTHER SURGICAL HISTORY     Cyst removed from back  . PORTACATH PLACEMENT  2002  . RADIOLOGY WITH ANESTHESIA N/A 10/28/2018   Procedure: IR WITH ANESTHESIA;  Surgeon: Radiologist, Medication, MD;  Location: [place];  Service: Radiology;  Laterality: N/A;  . REMOVAL OF A DIALYSIS CATHETER    . RENAL BIOPSY, PERCUTANEOUS  2012  . STUMP REVISION Right 01/12/2020   Procedure: REVISION RIGHT BELOW KNEE AMPUTATION;  Surgeon: Newt Minion, MD;  Location: [place];  Service: Orthopedics;  Laterality: Right;  . TONSILLECTOMY    . VASCULAR SURGERY    . WHIPPLE PROCEDURE  2002   duodenal ca, Dr. Harlow Asa    Assessment & Plan Clinical Impression: Patient is a 64 y.o. year old male with recent admission to the hospital on 12/30/2019 with nonspecific pain and rash at the end of right leg stump 3 days prior. Admission chemistries glucose 269 BUN 80 creatinine 4.76, hemoglobin 10.4 WBC 13,300, lactic acid 0.5, SARS coronavirus negative, urinalysis negative nitrite, CK 56. CT of right lower extremity showed swelling edema at the distal stump with possible cutaneous blister. There was more focal hyperdense collection at the posterior lateral aspect of the distal stump measuring approximately 2.6 x 1.7 x 2.6 cm possibly representing hematoma. MRI of right lower extremity showed no findings of osteomyelitis no drainable abscess. Renal ultrasound showed no abscess or hydronephrosis. Placed on vancomycin and cefepime for questionable early cellulitis. Hospital course bouts of A. fib RVR 120s to 130 blood pressures 90s/60s received fluid bolus follow-up cardiology services and placed on IV amiodarone transition to p.o. as well as digoxin. Follow-up renal  services for progressive CKD stage IV necessitating need for dialysis initiated 01/04/2020 and arrangements have been made for ongoing outpatient dialysis on discharge with latest creatinine 4.14. Follow-up Dr. Sharol Given for cellulitis suspect osteomyelitis right below-knee amputation had no change with conservative care and underwent revision right BKA 01/12/2020.  Patient transferred to CIR on 01/18/2020 .    Patient currently requires min with basic self-care skills secondary to muscle weakness and decreased standing balance and decreased balance strategies.  Prior to hospitalization, patient could complete ADLs with modified independent .  Patient will benefit from skilled intervention to decrease level of assist with basic self-care skills and increase independence with basic self-care skills prior to discharge home with care partner.  Anticipate patient will require intermittent supervision and follow up home health.  OT - End of Session Activity Tolerance: Tolerates 30+ min activity with multiple rests Endurance Deficit: Yes Endurance Deficit Description: Pt requires  rest breaks secondary to fatigue OT Assessment Rehab Potential (ACUTE ONLY): Good OT Patient demonstrates impairments in the following area(s): Balance;Endurance;Pain OT Basic ADL's Functional Problem(s): Grooming;Bathing;Dressing;Toileting OT Advanced ADL's Functional Problem(s): Simple Meal Preparation;Light Housekeeping OT Transfers Functional Problem(s): Tub/Shower;Toilet OT Additional Impairment(s): None OT Plan OT Intensity: Minimum of 1-2 x/day, 45 to 90 minutes OT Frequency: 5 out of 7 days OT Duration/Estimated Length of Stay: 7-10 days OT Treatment/Interventions: Balance/vestibular training;Disease mangement/prevention;DME/adaptive equipment instruction;Community reintegration;Neuromuscular re-education;UE/LE Strength taining/ROM;Wheelchair propulsion/positioning;Therapeutic Exercise;Patient/family education;Discharge  planning;Pain management;Self Care/advanced ADL retraining;Therapeutic Activities;UE/LE Coordination activities OT Self Feeding Anticipated Outcome(s): independentr OT Basic Self-Care Anticipated Outcome(s): supervision OT Toileting Anticipated Outcome(s): supervision OT Bathroom Transfers Anticipated Outcome(s): supervision OT Recommendation Patient destination: Home Follow Up Recommendations: Home health OT Equipment Recommended: To be determined   OT Evaluation Precautions/Restrictions  Precautions Precautions: Fall Precaution Comments: B BKA, no pillow under R knee Restrictions Weight Bearing Restrictions: Yes RLE Weight Bearing: Non weight bearing  Pain Pain Assessment Pain Scale: 0-10 Pain Score: 2  Faces Pain Scale: Hurts a little bit Pain Type: Acute pain;Surgical pain Pain Location: Leg Pain Orientation: Right Pain Descriptors / Indicators: Aching Pain Onset: On-going Pain Intervention(s): Medication (See eMAR);Repositioned Multiple Pain Sites: No Home Living/Prior Functioning Home Living Living Arrangements: Spouse/significant other Available Help at Discharge: (P) Family, Available 24 hours/day, Available PRN/intermittently Type of Home: (P) House Home Access: (P) Ramped entrance Home Layout: (P) Two level, Able to live on main level with bedroom/bathroom Alternate Level Stairs-Number of Steps: 1 step down into the living room Alternate Level Stairs-Rails: (P) None Bathroom Shower/Tub: (P) Walk-in shower Bathroom Toilet: (P) Standard Bathroom Accessibility: (P) Yes Additional Comments: was in OP PT  Lives With: (P) Spouse IADL History Homemaking Responsibilities: (P) Yes Laundry Responsibility: (P) Secondary Current License: (P) Yes Education: (P) Has hand controls Occupation: (P) Full time employment Type of Occupation: (P) Civil engineer, contracting Prior Function Level of Independence: Requires assistive device for independence, Independent with basic ADLs   Able to Take Stairs?: Yes (with prosthetics) Driving: Yes Vocation: Full time employment Vocation Requirements: standing, walking, driving to various sites Comments: Uses RW more recently and SPC prior to a few weeks ago. Vision Baseline Vision/History: Wears glasses Wears Glasses: Reading only (has bifocals but only needs glasses to read since cataract surgery) Patient Visual Report: No change from baseline Perception  Perception: Within Functional Limits Praxis Praxis: Intact Cognition Overall Cognitive Status: Within Functional Limits for tasks assessed Arousal/Alertness: Awake/alert Orientation Level: Place;Situation;Person Person: Oriented Place: Oriented Situation: Oriented Year: 2021 Month: September Day of Week: Correct Memory: Appears intact Immediate Memory Recall: Sock;Blue;Bed Memory Recall Sock: Without Cue Memory Recall Blue: Without Cue Memory Recall Bed: Without Cue Attention: Selective Selective Attention: Appears intact Awareness: Appears intact Problem Solving: Appears intact Safety/Judgment: Appears intact Sensation Sensation Light Touch: Appears Intact Hot/Cold: Appears Intact Proprioception: Appears Intact Stereognosis: Appears Intact Additional Comments: sensation intact in BUEs Coordination Gross Motor Movements are Fluid and Coordinated: Yes Fine Motor Movements are Fluid and Coordinated: Yes Finger Nose Finger Test: intact Motor  Motor Motor: Within Functional Limits Motor - Skilled Clinical Observations: generalized weakness  Trunk/Postural Assessment  Cervical Assessment Cervical Assessment: Within Functional Limits Thoracic Assessment Thoracic Assessment: Within Functional Limits Lumbar Assessment Lumbar Assessment: Exceptions to [facility] (posterior pelvic tilt EOB) Postural Control Postural Control: Deficits on evaluation Righting Reactions: delayed Protective Responses: posterior LOB with initial sitting Postural Limitations:  limited ability to reach outside BOS in sitting without LOB  Balance Balance Balance Assessed: Yes Static Sitting Balance  Static Sitting - Balance Support: Right upper extremity supported;Left upper extremity supported Static Sitting - Level of Assistance: 5: Stand by assistance Dynamic Sitting Balance Dynamic Sitting - Balance Support: During functional activity Dynamic Sitting - Level of Assistance: 5: Stand by assistance Sitting balance - Comments: Requires CGA sitting EOM to complete dynamic task Extremity/Trunk Assessment RUE Assessment RUE Assessment: Exceptions to [facility] Active Range of Motion (AROM) Comments: WFLS throughout General Strength Comments: shoulder flexion 3+/5, elbow flexion/extension, 4/5, grip 4/5 LUE Assessment LUE Assessment: Exceptions to [facility] Active Range of Motion (AROM) Comments: AROM WFLS for all joints General Strength Comments: shoulder flexion 3+/5, elbow flexion/extension 4/5, grip 3+/5.  Decreased strength in digits noted when attempting to open the toothpaste, needing assist from therapist.  Care Tool Care Tool Self Care Eating        Oral Care    Oral Care Assist Level: Set up assist    Bathing   Body parts bathed by patient: Right arm;Left arm;Chest;Abdomen;Right upper leg;Left upper leg;Face   Body parts n/a: Front perineal area;Buttocks;Right lower leg;Left lower leg (did not attempt secondary to completing earlier with nursing per his report/ LEs NA) Assist Level: Minimal Assistance - Patient > 75%    Upper Body Dressing(including orthotics)   What is the patient wearing?: Pull over shirt   Assist Level: Set up assist    Lower Body Dressing (excluding footwear)   What is the patient wearing?: Incontinence brief;Pants Assist for lower body dressing: Minimal Assistance - Patient > 75%    Putting on/Taking off footwear   What is the patient wearing?:  (did not attempt)         Care Tool Toileting Toileting activity         Care  Tool Bed Mobility Roll left and right activity   Roll left and right assist level: Contact Guard/Touching assist    Sit to lying activity   Sit to lying assist level: Contact Guard/Touching assist    Lying to sitting edge of bed activity   Lying to sitting edge of bed assist level: Minimal Assistance - Patient > 75%     Care Tool Transfers Sit to stand transfer Sit to stand activity did not occur: Safety/medical concerns (status as B BKA)      Chair/bed transfer   Chair/bed transfer assist level: Minimal Assistance - Patient > 75%     Toilet transfer   Assist Level: Contact Guard/Touching assist     Care Tool Cognition Expression of Ideas and Wants Expression of Ideas and Wants: Without difficulty (complex and basic) - expresses complex messages without difficulty and with speech that is clear and easy to understand   Understanding Verbal and Non-Verbal Content Understanding Verbal and Non-Verbal Content: Understands (complex and basic) - clear comprehension without cues or repetitions   Memory/Recall Ability *first 3 days only      Refer to Care Plan for Long Term Goals  SHORT TERM GOAL WEEK 1 OT Short Term Goal 1 (Week 1): Short term goals equal to LTGs set at overall supervision level based on ELOS.  Recommendations for other services: None    Skilled Therapeutic Intervention ADL ADL Eating: Independent Where Assessed-Eating: Bed level Grooming: Setup Where Assessed-Grooming: Wheelchair Upper Body Bathing: Supervision/safety Where Assessed-Upper Body Bathing: Edge of bed Lower Body Bathing: Minimal assistance Where Assessed-Lower Body Bathing: Edge of bed Upper Body Dressing: Setup Where Assessed-Upper Body Dressing: Edge of bed Lower Body Dressing: Minimal assistance Where Assessed-Lower Body Dressing: Edge of bed Toileting: Moderate  assistance Where Assessed-Toileting: Bedside Commode Toilet Transfer: Minimal assistance Toilet Transfer Method: Editor, commissioning: Drop arm bedside commode Tub/Shower Transfer: Not assessed Social research officer, government: Not assessed Mobility  Bed Mobility Bed Mobility: Sit to Supine;Supine to Sit Rolling Right: Supervision/verbal cueing Rolling Left: Supervision/Verbal cueing Supine to Sit: Supervision/Verbal cueing Sitting - Scoot to Edge of Bed: Minimal Assistance - Patient > 75% Sit to Supine: Contact Guard/Touching assist  During session pt worked on bathing and dressing sitting EOB with lateral leans for pulling items over his hips.  He declined washing peri area and buttocks as it had been cleaned earlier after BM with nursing.  Min assist for sliding board transfer to the wheelchair in order to complete grooming tasks of shaving, oral hygiene, and brushing his hair.  He was left up with the call button and phone in reach and safety belt in place.  Discharge Criteria: Patient will be discharged from OT if patient refuses treatment 3 consecutive times without medical reason, if treatment goals not met, if there is a change in medical status, if patient makes no progress towards goals or if patient is discharged from hospital.  The above assessment, treatment plan, treatment alternatives and goals were discussed and mutually agreed upon: by patient  Janeliz Prestwood OTR/L 01/19/2020, 12:42 PM

## 2020-01-19 NOTE — Progress Notes (Addendum)
[place] Individual Statement of Services  Patient Name:  Paul [name]  Date:  01/19/2020  Welcome to the [facility] [place].  Our goal is to provide you with an individualized program based on your diagnosis and situation, designed to meet your specific needs.  With this comprehensive rehabilitation program, you will be expected to participate in at least 3 hours of rehabilitation therapies Monday-Friday, with modified therapy programming on the weekends.  Your rehabilitation program will include the following services:  Physical Therapy (PT), Occupational Therapy (OT), Speech Therapy (ST), 24 hour per day rehabilitation nursing, Therapeutic Recreaction (TR), Neuropsychology, Care Coordinator, Rehabilitation Medicine, Nutrition Services, Pharmacy Services and Other  Weekly team conferences will be held on Wednesdays to discuss your progress.  Your Inpatient Rehabilitation Care Coordinator will talk with you frequently to get your input and to update you on team discussions.  Team conferences with you and your family in attendance may also be held.  Expected length of stay: 7-10 Days   Overall anticipated outcome: Supervision WC Level  Depending on your progress and recovery, your program may change. Your Inpatient Rehabilitation Care Coordinator will coordinate services and will keep you informed of any changes. Your Inpatient Rehabilitation Care Coordinator's name and contact numbers are listed  below.  The following services may also be recommended but are not provided by the [facility]:    [place] will be made to provide these services after discharge if needed.  Arrangements include referral to agencies that provide these services.  Your insurance has been verified to be:  Cign[employer]/Medicare/BCBS of [place] Your primary doctor is:  [place], [place],  MD  Pertinent information will be shared with your doctor and your insurance company.  Inpatient Rehabilitation Care Coordinator:  Erlene Quan, [place] or [phone]  Information discussed with and copy given to patient by: Dyanne Iha, 01/19/2020, 8:40 AM

## 2020-01-19 NOTE — H&P (Signed)
Physical Medicine and Rehabilitation Admission H&P        Chief Complaint  Patient presents with  . Cellulitis  : HPI: Paul [name] is a 64 year old right-handed male with history of renal transplant 2016 with baseline creatinine 2.8-3.4 maintained on chronic prednisone, Prograf as well as Myfortic, hypertension, insulin-dependent diabetes mellitus, chronic atrial fibrillation on Eliquis, bilateral below-knee amputations receiving inpatient rehab services 08/03/2013 to 08/15/2013 as well as CIR 05/31/2013 to 06/11/2013 for multifactorial gait disorder related to peripheral neuropathy.  Patient lives with spouse.  Two-level home bed and bath main level with ramped entrance.  Independent with assistive device and prosthesis provided by [employer].  Presented 12/30/2019 with nonspecific pain and rash at the end of right leg stump 3 days prior.  Admission chemistries glucose 269 BUN 80 creatinine 4.76, hemoglobin 10.4 WBC 13,300, lactic acid 0.5, SARS coronavirus negative, urinalysis negative nitrite, CK 56.  CT of right lower extremity showed swelling edema at the distal stump with possible cutaneous blister.  There was more focal hyperdense collection at the posterior lateral aspect of the distal stump measuring approximately 2.6 x 1.7 x 2.6 cm possibly representing hematoma.  MRI of right lower extremity showed no findings of osteomyelitis no drainable abscess.  Renal ultrasound showed no abscess or hydronephrosis.  Placed on vancomycin and cefepime for questionable early cellulitis.  Hospital course bouts of A. fib RVR 120s to 130 blood pressures 90s/60s received fluid bolus follow-up cardiology services and placed on IV amiodarone transition to p.o. as well as digoxin.  Follow-up renal services for progressive CKD stage IV necessitating need for dialysis initiated 01/04/2020 and arrangements have been made for ongoing outpatient dialysis on discharge with latest creatinine 4.14.  Follow-up Dr. Sharol Given  for cellulitis suspect osteomyelitis right below-knee amputation had no change with conservative care and underwent revision right BKA 01/12/2020 and initial placement of wound VAC that was later discontinued.  Patient remains on Eliquis as prior to admission.  Acute on chronic anemia 9.9 and monitored.  Therapy evaluations completed and patient was admitted for a comprehensive rehab program.   Pt reports he's oliguric, not anuric- LBM this AM- not constipated.  Voided x1 since foley removed this AM- in urinal.  No dysuria.  Pain in R BKA- also has wound on tailbone that's painful.        Review of Systems  Constitutional: Positive for malaise/fatigue. Negative for chills and fever.  HENT: Negative for hearing loss.   Eyes: Negative for blurred vision and double vision.  Respiratory: Negative for cough and shortness of breath.   Cardiovascular: Positive for palpitations. Negative for chest pain.  Gastrointestinal: Negative for constipation, heartburn, nausea and vomiting.  Genitourinary: Negative for dysuria, flank pain and hematuria.  Musculoskeletal: Positive for joint pain and myalgias.  Skin: Negative for rash.  Psychiatric/Behavioral: Positive for depression.  All other systems reviewed and are negative.       Past Medical History:  Diagnosis Date  . A-fib ([place])    . Anemia      esrd  . Calciphylaxis 05/2013    lower ext  . Cancer ([place])      hx duodenal adenoCa 2002, resected  . Closed left arm fracture 1967  . Corneal abrasion, left    . Depression    . Diabetes mellitus      Type 1 iddm x 11 yrs  . Diabetic neuropathy ([place])    . ESRD on hemodialysis ([place])      Pt  has ESRD due to DM.  He had a L upper arm AVF prior to starting HD which was ligated due to L arm steal syndrome.  He started HD in Jan 2014 and did home HD.  The family couldn't cannulate the R arm AVF successfully so by mid 2014 they decided to switch to PD which was done late summer 2014.  In Jan 2015 he was  admitted with FTT felt to be due to underdialysis on PD and PD was abandoned and he   . GERD (gastroesophageal reflux disease)    . Glomerulosclerosis, diabetic ([facility])    . Heart murmur    . History of blood transfusion    . Hypertension      off of meds due to orthostatic hypotension  . Kidney transplant recipient    . Open wound of both legs with complication      Pt has had progressive wounds of both LE's including gangrene of the toes and patchy necrosis of the calves.  [facility] Jimmey Ralph with hyperbaric O2 5d per week.  He is getting Na Thiosulfate with HD for suspected calciphylaxis. Pt says doctor's aren't sure if these ulcers were diabetic ulcers or calciphylaxis.  He underwent bilat BKA on 07/30/13.  He says that the woun         Past Surgical History:  Procedure Laterality Date  . AMPUTATION Bilateral 07/30/2013    Procedure: AMPUTATION BELOW KNEE;  Surgeon: Nadara Mustard, MD;  Location: [facility] OR;  Service: Orthopedics;  Laterality: Bilateral;  Bilateral Below Knee Amputations  . AMPUTATION Bilateral 09/03/2013    Procedure: AMPUTATION BELOW KNEE;  Surgeon: Nadara Mustard, MD;  Location: [facility] OR;  Service: Orthopedics;  Laterality: Bilateral;  Bilateral Below Knee Amputation Revision  . AV FISTULA PLACEMENT   06/07/2011    Procedure: ARTERIOVENOUS (AV) FISTULA CREATION;  Surgeon: Chuck Hint, MD;  Location: [facility] OR;  Service: Vascular;  Laterality: Left;  . AV FISTULA PLACEMENT   06/18/2012    Procedure: ARTERIOVENOUS (AV) FISTULA CREATION;  Surgeon: Pryor Ochoa, MD;  Location: [facility] OR;  Service: Vascular;  Laterality: Right;  CIMINO  . BILIARY DIVERSION, EXTERNAL   2002  . BONE MARROW BIOPSY   2012  . CAPD INSERTION N/A 01/14/2013    Procedure: LAPAROSCOPIC INSERTION CONTINUOUS AMBULATORY PERITONEAL DIALYSIS  (CAPD) CATHETER;  Surgeon: Ardeth Sportsman, MD;  Location: [facility] OR;  Service: General;  Laterality: N/A;  . CAPD REMOVAL N/A 07/09/2013    Procedure:  CONTINUOUS AMBULATORY PERITONEAL DIALYSIS  (CAPD) CATHETER REMOVAL;  Surgeon: Ardeth Sportsman, MD;  Location: [facility] OR;  Service: General;  Laterality: N/A;  . COLONOSCOPY        Hx: of  . INSERTION OF DIALYSIS CATHETER   05/22/2012    Procedure: INSERTION OF DIALYSIS CATHETER;  Surgeon: Pryor Ochoa, MD;  Location: [facility] OR;  Service: Vascular;  Laterality: N/A;  right internal jugular vein  . IR ANGIO INTRA EXTRACRAN SEL COM CAROTID INNOMINATE BILAT MOD SED   10/28/2018  . IR ANGIO VERTEBRAL SEL SUBCLAVIAN INNOMINATE UNI R MOD SED   10/28/2018  . IR ANGIO VERTEBRAL SEL VERTEBRAL UNI L MOD SED   10/28/2018  . LIGATION OF ARTERIOVENOUS  FISTULA   05/22/2012    Procedure: LIGATION OF ARTERIOVENOUS  FISTULA;  Surgeon: Pryor Ochoa, MD;  Location: [facility] OR;  Service: Vascular;  Laterality: Left;  Left brachio-cephalic fistula  . LOWER EXTREMITY ANGIOGRAM N/A  06/14/2013    Procedure: LOWER EXTREMITY ANGIOGRAM;  Surgeon: Angelia Mould, MD;  Location: [facility] CATH LAB;  Service: Cardiovascular;  Laterality: N/A;  . NEPHRECTOMY TRANSPLANTED ORGAN      . OTHER SURGICAL HISTORY        Cyst removed from back  . PORTACATH PLACEMENT   2002  . RADIOLOGY WITH ANESTHESIA N/A 10/28/2018    Procedure: IR WITH ANESTHESIA;  Surgeon: Radiologist, Medication, MD;  Location: [facility];  Service: Radiology;  Laterality: N/A;  . REMOVAL OF A DIALYSIS CATHETER      . RENAL BIOPSY, PERCUTANEOUS   2012  . STUMP REVISION Right 01/12/2020    Procedure: REVISION RIGHT BELOW KNEE AMPUTATION;  Surgeon: Newt Minion, MD;  Location: [place];  Service: Orthopedics;  Laterality: Right;  . TONSILLECTOMY      . VASCULAR SURGERY      . WHIPPLE PROCEDURE   2002    duodenal ca, Dr. Harlow Asa         Family History  Problem Relation Age of Onset  . Cancer Mother    . Cancer Father          prostate cancer  . Heart disease Maternal Grandfather    . Stroke Paternal Grandmother      Social History:  reports that he has never smoked. He has  never used smokeless tobacco. He reports that he does not drink alcohol and does not use drugs. Allergies:       Allergies  Allergen Reactions  . Metformin And Related Other (See Comments)      Has kidney problems          Medications Prior to Admission  Medication Sig Dispense Refill  . acetaminophen (TYLENOL) 500 MG tablet Take 500 mg by mouth every 6 (six) hours as needed for mild pain or moderate pain.      [name] amLODipine (NORVASC) 10 MG tablet Take 10 mg by mouth daily.      [name] aspirin 81 MG chewable tablet Chew 81 mg by mouth daily.      [name] atorvastatin (LIPITOR) 10 MG tablet Take 1 tablet (10 mg total) by mouth daily. (Patient taking differently: Take 10 mg by mouth every Monday, Wednesday, and Friday. ) 30 tablet 1  . calcitRIOL (ROCALTROL) 0.25 MCG capsule Take 0.25 mcg by mouth daily.       . calcium acetate (PHOSLO) 667 MG capsule Take 1,334 mg by mouth 3 (three) times daily with meals.      . CVS D3 50 MCG (2000 UT) CAPS Take 4,000 Units by mouth daily.      [name] ELIQUIS 5 MG TABS tablet TAKE 1 TABLET BY MOUTH TWICE A DAY (Patient taking differently: Take 5 mg by mouth 2 (two) times daily. ) 60 tablet 4  . feeding supplement, GLUCERNA SHAKE, (GLUCERNA SHAKE) LIQD Take 237 mLs by mouth 3 (three) times daily between meals.      [name] HUMALOG KWIKPEN 100 UNIT/ML KwikPen Inject 4-8 Units into the skin daily. Sliding Scale      . hydrALAZINE (APRESOLINE) 100 MG tablet Take 100 mg by mouth 2 (two) times daily. Afternoon, bedtime      . Insulin Glargine (BASAGLAR KWIKPEN) 100 UNIT/ML SOPN Inject 10 Units into the skin daily.       . magnesium chloride (SLOW-MAG) 64 MG TBEC SR tablet Take 2 tablets by mouth 2 (two) times daily. Take at least 2 hours separate from tacrolimus.      [name]  mycophenolate (MYFORTIC) 180 MG EC tablet Take 360 mg by mouth 2 (two) times daily.       [name] omeprazole (PRILOSEC) 40 MG capsule Take 40 mg by mouth at bedtime.       . predniSONE (DELTASONE) 5 MG tablet Take 5 mg by  mouth daily with breakfast.       . sertraline (ZOLOFT) 50 MG tablet Take 50 mg by mouth at bedtime.       . sodium bicarbonate 650 MG tablet Take 1,950 mg by mouth 4 (four) times daily.      . tacrolimus (PROGRAF) 1 MG capsule Take 2-3 mg by mouth See admin instructions. Take 3 mg by mouth in the morning at 9 AM and 2 mg in the evening at 9 PM      . tamsulosin (FLOMAX) 0.4 MG CAPS capsule Take 0.4 mg by mouth daily after supper.           Drug Regimen Review Drug regimen was reviewed and remains appropriate with no significant issues identified   Home: Home Living Family/patient expects to be discharged to:: Private residence Living Arrangements: Spouse/significant other Available Help at Discharge: Family, Available PRN/intermittently Type of Home: House Home Access: Ramped entrance Home Layout: Two level, Able to live on main level with bedroom/bathroom Alternate Level Stairs-Number of Steps: 1 step down into the living room Alternate Level Stairs-Rails: None Bathroom Shower/Tub: Multimedia programmer: Standard Bathroom Accessibility: Yes (toilet is not accessible) Home Equipment: Leon - single point, Shower seat, Wheelchair - manual, Crutches, Environmental consultant - 2 wheels, Hand held shower head, Grab bars - toilet, Shower seat - built in   Functional History: Prior Function Level of Independence: Independent with assistive device(s) Comments: Uses RW more recently and SPC prior to a few weeks ago.   Functional Status:  Mobility: Bed Mobility Overal bed mobility: Needs Assistance Bed Mobility: Supine to Sit, Sidelying to Sit, Sit to Sidelying Rolling: Supervision (heavy use of rails) Sidelying to sit: Mod assist Supine to sit: Min assist, HOB elevated Sit to sidelying: Min guard General bed mobility comments: pt able to transition from supine to sit with HOB 30 degrees with only guarding. In sitting with pivot in bed noted large incontinent stool with pt assisted to sidelying  for pericare then mod assist to return to sitting from sidelying Transfers Overall transfer level: Needs assistance Transfers: Lateral/Scoot Transfers  Lateral/Scoot Transfers: Min assist General transfer comment: pt able to initiate scooting toward chair with drop arm lateral scoot but required assist of pad and cues to complete transition fully from bed to chair Ambulation/Gait General Gait Details: unable   ADL: ADL Overall ADL's : Needs assistance/impaired Grooming: Set up, Sitting Upper Body Bathing: Set up, Sitting, Bed level Lower Body Bathing: Moderate assistance, Bed level Upper Body Dressing : Minimal assistance, Bed level Lower Body Dressing: Moderate assistance, Bed level Toileting- Clothing Manipulation and Hygiene: Moderate assistance, Bed level Functional mobility during ADLs:  (declined OOB) General ADL Comments: limited today by fatigue/low Hgb   Cognition: Cognition Overall Cognitive Status: Impaired/Different from baseline Orientation Level: Oriented X4 Cognition Arousal/Alertness: Awake/alert Behavior During Therapy: WFL for tasks assessed/performed Overall Cognitive Status: Impaired/Different from baseline Area of Impairment: Safety/judgement, Memory, Problem solving Current Attention Level: Selective Memory: Decreased short-term memory Safety/Judgement: Decreased awareness of deficits, Decreased awareness of safety Problem Solving: Slow processing General Comments: pt unaware of incontinent stool, difficulty with recent events and decreased awareness of current function   Physical Exam: Blood pressure (!) 154/66,  pulse 64, temperature 98.4 F (36.9 C), temperature source Oral, resp. rate 14, height 6' (1.829 m), weight 71 kg, SpO2 98 %. Physical Exam Vitals and nursing note reviewed. Exam conducted with a chaperone present.  Constitutional:      Comments: Supine in bed- on backside; appropriate, alert, wife at bedside, NAD  HENT:     Head:  Normocephalic and atraumatic.     Comments: Smile equal    Right Ear: External ear normal.     Left Ear: External ear normal.     Nose: Nose normal. No congestion.     Mouth/Throat:     Mouth: Mucous membranes are dry.     Pharynx: Oropharynx is clear. No oropharyngeal exudate.  Eyes:     General:        Right eye: No discharge.        Left eye: No discharge.     Extraocular Movements: Extraocular movements intact.  Cardiovascular:     Comments: RRR- no M/R/G Pulmonary:     Comments: CTA B/L- no W/R/R- good air movement  Abdominal:     Comments: Soft, NT< ND, (+)BS  Musculoskeletal:     Cervical back: Normal range of motion and neck supple.     Comments: R BKA wrapped in ACE wrap- asked me not to unwrap since very painful and wants pain meds to kick in before checks it UEs 5/5 in deltoid, biceps, triceps, WE, grip B/L and finger abd 4-/5 B/L- tested twice LEs- lacking 20-3 degrees of Full knee extension on R- not propping with pillow correctly- advised him how to do LLE HF/KE/KF 5-/5- at least 3/5 on RLE with BKA but very painful to do ROM  Skin:    Comments: Right BKA revision wrapped dressing in place appropriately tender.  Left BKA well-healed  Stage II on sacrum oval/elliptoid shape - ~ 2 to 2.5 inches in diameter at biggest- top layer of skin off- Is Stage II.  IV in L Forearm and L wrist- R forearm has fistula- thrill palpable  Neurological:     Comments: Patient is alert no acute distress.  Oriented x3 and follows commands.  Ox3- appropriate, intact sensation to light touch in UEs and to knees B/L        Lab Results Last 48 Hours        Results for orders placed or performed during the hospital encounter of 12/30/19 (from the past 48 hour(s))  Glucose, capillary     Status: Abnormal    Collection Time: 01/16/20 11:11 AM  Result Value Ref Range    Glucose-Capillary 353 (H) 70 - 99 mg/dL      Comment: Glucose reference range applies only to samples taken after  fasting for at least 8 hours.    Comment 1 Notify RN      Comment 2 Document in Chart    Glucose, capillary     Status: Abnormal    Collection Time: 01/16/20  5:05 PM  Result Value Ref Range    Glucose-Capillary 155 (H) 70 - 99 mg/dL      Comment: Glucose reference range applies only to samples taken after fasting for at least 8 hours.    Comment 1 Notify RN      Comment 2 Document in Chart    Glucose, capillary     Status: Abnormal    Collection Time: 01/16/20  9:02 PM  Result Value Ref Range    Glucose-Capillary 124 (H) 70 - 99 mg/dL  Comment: Glucose reference range applies only to samples taken after fasting for at least 8 hours.  Heparin level (unfractionated)     Status: None    Collection Time: 01/17/20 12:57 AM  Result Value Ref Range    Heparin Unfractionated 0.31 0.30 - 0.70 IU/mL      Comment: (NOTE) If heparin results are below expected values, and patient dosage has  been confirmed, suggest follow up testing of antithrombin III levels. Performed at [facility], [place] [address] [place], [place] [address]    CBC     Status: Abnormal    Collection Time: 01/17/20 12:57 AM  Result Value Ref Range    WBC 9.5 4.0 - 10.5 K/uL    RBC 2.85 (L) 4.22 - 5.81 MIL/uL    Hemoglobin 8.2 (L) 13.0 - 17.0 g/dL    HCT 26.8 (L) 39 - 52 %    MCV 94.0 80.0 - 100.0 fL    MCH 28.8 26.0 - 34.0 pg    MCHC 30.6 30.0 - 36.0 g/dL    RDW 15.6 (H) 11.5 - 15.5 %    Platelets 116 (L) 150 - 400 K/uL      Comment: REPEATED TO VERIFY PLATELET COUNT CONFIRMED BY SMEAR SPECIMEN CHECKED FOR CLOTS Immature Platelet Fraction may be clinically indicated, consider ordering this additional test [id number]      nRBC 0.0 0.0 - 0.2 %      Comment: Performed at [facility], [place] [address] [place], [place] [address]  Glucose, capillary     Status: Abnormal    Collection Time: 01/17/20  6:19 AM  Result Value Ref Range    Glucose-Capillary 154 (H) 70 - 99 mg/dL      Comment: Glucose  reference range applies only to samples taken after fasting for at least 8 hours.  Glucose, capillary     Status: Abnormal    Collection Time: 01/17/20 11:31 AM  Result Value Ref Range    Glucose-Capillary 323 (H) 70 - 99 mg/dL      Comment: Glucose reference range applies only to samples taken after fasting for at least 8 hours.    Comment 1 Notify RN      Comment 2 Document in Chart    Glucose, capillary     Status: Abnormal    Collection Time: 01/17/20  4:28 PM  Result Value Ref Range    Glucose-Capillary 193 (H) 70 - 99 mg/dL      Comment: Glucose reference range applies only to samples taken after fasting for at least 8 hours.    Comment 1 Notify RN      Comment 2 Document in Chart    Glucose, capillary     Status: Abnormal    Collection Time: 01/17/20  9:43 PM  Result Value Ref Range    Glucose-Capillary 152 (H) 70 - 99 mg/dL      Comment: Glucose reference range applies only to samples taken after fasting for at least 8 hours.  Renal function panel     Status: Abnormal    Collection Time: 01/18/20  5:53 AM  Result Value Ref Range    Sodium 136 135 - 145 mmol/L    Potassium 5.1 3.5 - 5.1 mmol/L    Chloride 103 98 - 111 mmol/L    CO2 23 22 - 32 mmol/L    Glucose, Bld 169 (H) 70 - 99 mg/dL      Comment: Glucose reference range applies only to samples taken  after fasting for at least 8 hours.    BUN 47 (H) 8 - 23 mg/dL    Creatinine, Ser 4.14 (H) 0.61 - 1.24 mg/dL    Calcium 8.4 (L) 8.9 - 10.3 mg/dL    Phosphorus 4.3 2.5 - 4.6 mg/dL    Albumin 2.1 (L) 3.5 - 5.0 g/dL    GFR calc non Af Amer 14 (L) >60 mL/min    GFR calc Af Amer 16 (L) >60 mL/min    Anion gap 10 5 - 15      Comment: Performed at [facility] [address] [place], [place] [address]  CBC     Status: Abnormal    Collection Time: 01/18/20  5:53 AM  Result Value Ref Range    WBC 9.5 4.0 - 10.5 K/uL    RBC 3.44 (L) 4.22 - 5.81 MIL/uL    Hemoglobin 9.9 (L) 13.0 - 17.0 g/dL    HCT 31.8 (L) 39 - 52 %     MCV 92.4 80.0 - 100.0 fL    MCH 28.8 26.0 - 34.0 pg    MCHC 31.1 30.0 - 36.0 g/dL    RDW 15.3 11.5 - 15.5 %    Platelets 144 (L) 150 - 400 K/uL    nRBC 0.0 0.0 - 0.2 %      Comment: Performed at [facility], [place] [address], [place], [place] [address]  Glucose, capillary     Status: Abnormal    Collection Time: 01/18/20  6:04 AM  Result Value Ref Range    Glucose-Capillary 159 (H) 70 - 99 mg/dL      Comment: Glucose reference range applies only to samples taken after fasting for at least 8 hours.      Imaging Results (Last 48 hours)  No results found.           Medical Problem List and Plan: 1.  Decreased functional ability secondary to cellulitis/osteomyelitis right BKA with history of bilateral BKA.  Status post right BKA revision 01/12/2020.  Wound VAC has been discontinued             -patient may  Shower if R BKA wrapped             -ELOS/Goals: 5-9 days- mod I to supervision 2.  Antithrombotics: -DVT/anticoagulation: Eliquis             -antiplatelet therapy: Aspirin 81 mg daily 3. Pain Management: Oxycodone as needed 4. Mood: Zoloft 50 mg daily.  Provide emotional support             -antipsychotic agents: N/A 5. Neuropsych: This patient is capable of making decisions on his own behalf. 6. Skin/Wound Care: Routine skin checks 7. Fluids/Electrolytes/Nutrition: Routine in and outs with follow-up chemistries 8.  Acute kidney injury on CKD stage IV with history of kidney transplant 2016.  Long-term dialysis now initiated 01/04/2020.  Outpatient dialysis set up for Tuesday Thursday Saturday at [facility] dialysis center.  Continue low-dose chronic prednisone, Myfortic 180 mg twice daily as well as Prograf 2 mg twice daily 9.  Chronic anemia.  Follow-up CBC.  Continue Aranesp 10.  Diabetes mellitus with peripheral neuropathy (on chronic prednisone).  Hemoglobin A1c 10.3.  NovoLog 2 units 3 times daily, Lantus insulin 8 units daily. 11.  Atrial fibrillation.  Amiodarone as  directed.  Continue Eliquis.  Cardiac rate controlled 12.  Hyperlipidemia.  Lipitor 13. Stage II sacral pressure ulcer- on admission- will need to con't foam dressing,  keep pt off backside unless eating or doing therapy- might benefit from air mattress if won't impair transfers.        Lavon Paganini Angiulli, PA-C 01/18/2020      I have personally performed a face to face diagnostic evaluation of this patient and formulated the key components of the plan.  Additionally, I have personally reviewed laboratory data, imaging studies, as well as relevant notes and concur with the physician assistant's documentation above.   The patient's status has not changed from the original H&P.  Any changes in documentation from the acute care chart have been noted above.

## 2020-01-19 NOTE — Progress Notes (Addendum)
[facility] PHYSICAL MEDICINE & REHABILITATION PROGRESS NOTE   Subjective/Complaints:  Stump pain related to tight compression yesterday, felt better after it was re wrapped No bowel issues  ROS- neg CP, SOB, N/V/D   Objective:   No results found. Recent Labs    01/17/20 0057 01/18/20 0553  WBC 9.5 9.5  HGB 8.2* 9.9*  HCT 26.8* 31.8*  PLT 116* 144*   Recent Labs    01/18/20 0553  NA 136  K 5.1  CL 103  CO2 23  GLUCOSE 169*  BUN 47*  CREATININE 4.14*  CALCIUM 8.4*    Intake/Output Summary (Last 24 hours) at 01/19/2020 0948 Last data filed at 01/19/2020 0715 Gross per 24 hour  Intake 120 ml  Output 175 ml  Net -55 ml     Physical Exam: Vital Signs Blood pressure (!) 147/63, pulse 60, temperature 98.1 F (36.7 C), temperature source Oral, resp. rate 16, weight 69.3 kg, SpO2 97 %.  General: No acute distress Mood and affect are appropriate Heart: Regular rate and rhythm no rubs murmurs or extra sounds Lungs: Clear to auscultation, breathing unlabored, no rales or wheezes Abdomen: Positive bowel sounds, soft nontender to palpation, nondistended Extremities: No clubbing, cyanosis, or edema Skin: No evidence of breakdown, no evidence of rash, see R BKA image below Neurologic: Cranial nerves II through XII intact, motor strength is 5/5 in bilateral deltoid, bicep, tricep, grip, hip flexor,  Musculoskeletal:well healed Left BKA short residual, RIght BKA image below       Assessment/Plan: 1. Functional deficits secondary to Right BKA revision, hx Left BKA which require 3+ hours per day of interdisciplinary therapy in a comprehensive inpatient rehab setting.  Physiatrist is providing close team supervision and 24 hour management of active medical problems listed below.  Physiatrist and rehab team continue to assess barriers to discharge/monitor patient progress toward functional and medical goals  Care Tool:  Bathing              Bathing assist         Upper Body Dressing/Undressing Upper body dressing        Upper body assist      Lower Body Dressing/Undressing Lower body dressing      What is the patient wearing?: Incontinence brief     Lower body assist Assist for lower body dressing: Moderate Assistance - Patient 50 - 74%     Toileting Toileting    Toileting assist Assist for toileting: Maximal Assistance - Patient 25 - 49%     Transfers Chair/bed transfer  Transfers assist           Locomotion Ambulation   Ambulation assist              Walk 10 feet activity   Assist           Walk 50 feet activity   Assist           Walk 150 feet activity   Assist           Walk 10 feet on uneven surface  activity   Assist           Wheelchair     Assist               Wheelchair 50 feet with 2 turns activity    Assist            Wheelchair 150 feet activity     Assist  Blood pressure (!) 147/63, pulse 60, temperature 98.1 F (36.7 C), temperature source Oral, resp. rate 16, weight 69.3 kg, SpO2 97 %.    Medical Problem List and Plan: 1.  Decreased functional ability secondary to cellulitis/osteomyelitis right BKA with history of bilateral BKA.  Status post right BKA revision 01/12/2020.  Wound VAC has been discontinued             -patient may  Shower if R BKA wrapped             -ELOS/Goals: 5-9 days- mod I to supervision 2.  Antithrombotics: -DVT/anticoagulation: Eliquis             -antiplatelet therapy: Aspirin 81 mg daily 3. Pain Management: Oxycodone as needed 4. Mood: Zoloft 50 mg daily.  Provide emotional support             -antipsychotic agents: N/A 5. Neuropsych: This patient is capable of making decisions on his own behalf. 6. Skin/Wound Care: Routine skin checks 7. Fluids/Electrolytes/Nutrition: Routine in and outs with follow-up chemistries 8.  Acute kidney injury on CKD stage IV with history of kidney transplant 2016.   Long-term dialysis now initiated 01/04/2020.  Outpatient dialysis set up for Tuesday Thursday Saturday at [facility] dialysis center.  Continue low-dose chronic prednisone, Myfortic 180 mg twice daily as well as Prograf 2 mg twice daily 9.  Chronic anemia.  Follow-up CBC.  Continue Aranesp 10.  Diabetes mellitus with peripheral neuropathy (on chronic prednisone).  Hemoglobin A1c CBG (last 3)  Recent Labs    01/18/20 0604 01/18/20 1123 01/19/20 0632  GLUCAP 159* 131* 140*  controlled 9/1   NovoLog 2 units 3 times daily, Lantus insulin 8 units daily. 11.  Atrial fibrillation.  Amiodarone as directed.  Continue Eliquis.  Cardiac rate controlled 12.  Hyperlipidemia.  Lipitor 13. Stage II sacral pressure ulcer- on admission- will need to con't foam dressing, keep pt off backside unless eating or doing therapy- might benefit from air mattress if won't impair transfers.     LOS: 1 days A FACE TO FACE EVALUATION WAS PERFORMED  Charlett Blake 01/19/2020, 9:48 AM

## 2020-01-19 NOTE — Evaluation (Signed)
Physical Therapy Assessment and Plan  Patient Details  Name: Paul [name] MRN: [id number] Date of Birth: 01/29/1956  PT Diagnosis: Muscle weakness and Pain in R RL Rehab Potential: Good ELOS: 7-10 days   Today's Date: 01/19/2020 PT Individual Time: 0355-9741 PT Individual Time Calculation (min): 55 min    Hospital Problem: Active Problems:   Right below-knee amputee [facility])   Past Medical History:  Past Medical History:  Diagnosis Date  . A-fib ([place])   . Anemia    esrd  . Calciphylaxis 05/2013   lower ext  . Cancer ([place])    hx duodenal adenoCa 2002, resected  . Closed left arm fracture 1967  . Corneal abrasion, left   . Depression   . Diabetes mellitus    Type 1 iddm x 11 yrs  . Diabetic neuropathy ([place])   . ESRD on hemodialysis ([place])    Pt has ESRD due to DM.  He had a L upper arm AVF prior to starting HD which was ligated due to L arm steal syndrome.  He started HD in Jan 2014 and did home HD.  The family couldn't cannulate the R arm AVF successfully so by mid 2014 they decided to switch to PD which was done late summer 2014.  In Jan 2015 he was admitted with FTT felt to be due to underdialysis on PD and PD was abandoned and he   . GERD (gastroesophageal reflux disease)   . Glomerulosclerosis, diabetic ([place])   . Heart murmur   . History of blood transfusion   . Hypertension    off of meds due to orthostatic hypotension  . Kidney transplant recipient   . Open wound of both legs with complication    Pt has had progressive wounds of both LE's including gangrene of the toes and patchy necrosis of the calves.  He has been treated at [facility] by Dr Jerline Pain with hyperbaric O2 5d per week.  He is getting Na Thiosulfate with HD for suspected calciphylaxis. Pt says doctor's aren't sure if these ulcers were diabetic ulcers or calciphylaxis.  He underwent bilat BKA on 07/30/13.  He says that the woun   Past Surgical History:  Past Surgical History:  Procedure Laterality Date  .  AMPUTATION Bilateral 07/30/2013   Procedure: AMPUTATION BELOW KNEE;  Surgeon: Newt Minion, MD;  Location: [place];  Service: Orthopedics;  Laterality: Bilateral;  Bilateral Below Knee Amputations  . AMPUTATION Bilateral 09/03/2013   Procedure: AMPUTATION BELOW KNEE;  Surgeon: Newt Minion, MD;  Location: [place];  Service: Orthopedics;  Laterality: Bilateral;  Bilateral Below Knee Amputation Revision  . AV FISTULA PLACEMENT  06/07/2011   Procedure: ARTERIOVENOUS (AV) FISTULA CREATION;  Surgeon: Angelia Mould, MD;  Location: [place];  Service: Vascular;  Laterality: Left;  . AV FISTULA PLACEMENT  06/18/2012   Procedure: ARTERIOVENOUS (AV) FISTULA CREATION;  Surgeon: Mal Misty, MD;  Location: [place];  Service: Vascular;  Laterality: Right;  Liborio Negron Torres  . BILIARY DIVERSION, EXTERNAL  2002  . BONE MARROW BIOPSY  2012  . CAPD INSERTION N/A 01/14/2013   Procedure: LAPAROSCOPIC INSERTION CONTINUOUS AMBULATORY PERITONEAL DIALYSIS  (CAPD) CATHETER;  Surgeon: Adin Hector, MD;  Location: [place];  Service: General;  Laterality: N/A;  . CAPD REMOVAL N/A 07/09/2013   Procedure: CONTINUOUS AMBULATORY PERITONEAL DIALYSIS  (CAPD) CATHETER REMOVAL;  Surgeon: Adin Hector, MD;  Location: [place];  Service: General;  Laterality: N/A;  . COLONOSCOPY     Hx:  of  . INSERTION OF DIALYSIS CATHETER  05/22/2012   Procedure: INSERTION OF DIALYSIS CATHETER;  Surgeon: Mal Misty, MD;  Location: [place];  Service: Vascular;  Laterality: N/A;  right internal jugular vein  . IR ANGIO INTRA EXTRACRAN SEL COM CAROTID INNOMINATE BILAT MOD SED  10/28/2018  . IR ANGIO VERTEBRAL SEL SUBCLAVIAN INNOMINATE UNI R MOD SED  10/28/2018  . IR ANGIO VERTEBRAL SEL VERTEBRAL UNI L MOD SED  10/28/2018  . LIGATION OF ARTERIOVENOUS  FISTULA  05/22/2012   Procedure: LIGATION OF ARTERIOVENOUS  FISTULA;  Surgeon: Mal Misty, MD;  Location: [place];  Service: Vascular;  Laterality: Left;  Left brachio-cephalic fistula  . LOWER EXTREMITY ANGIOGRAM  N/A 06/14/2013   Procedure: LOWER EXTREMITY ANGIOGRAM;  Surgeon: Angelia Mould, MD;  Location: [facility] CATH LAB;  Service: Cardiovascular;  Laterality: N/A;  . NEPHRECTOMY TRANSPLANTED ORGAN    . OTHER SURGICAL HISTORY     Cyst removed from back  . PORTACATH PLACEMENT  2002  . RADIOLOGY WITH ANESTHESIA N/A 10/28/2018   Procedure: IR WITH ANESTHESIA;  Surgeon: Radiologist, Medication, MD;  Location: [place];  Service: Radiology;  Laterality: N/A;  . REMOVAL OF A DIALYSIS CATHETER    . RENAL BIOPSY, PERCUTANEOUS  2012  . STUMP REVISION Right 01/12/2020   Procedure: REVISION RIGHT BELOW KNEE AMPUTATION;  Surgeon: Newt Minion, MD;  Location: [place];  Service: Orthopedics;  Laterality: Right;  . TONSILLECTOMY    . VASCULAR SURGERY    . WHIPPLE PROCEDURE  2002   duodenal ca, Dr. Harlow Asa    Assessment & Plan Clinical Impression: Patient is a 64 y.o. year old male with history of renal transplant 2016with baseline creatinine 2.8-3.4 maintained on chronic prednisone, Prograf as well as Myfortic, hypertension, insulin-dependent diabetes mellitus, chronic atrial fibrillation onEliquis, bilateral below-knee amputations receiving inpatient rehab services 08/03/2013 to 08/15/2013 as well as CIR 05/31/2013 to 06/11/2013 for multifactorial gait disorder related to peripheral neuropathy.Patient lives with spouse. Two-level home bed and bath main level with ramped entrance. Independent with assistive device and prosthesisprovided by [employer]. Presented 12/30/2019 with nonspecific pain and rash at the end of right leg stump 3 days prior. Admission chemistries glucose 269 BUN 80 creatinine 4.76, hemoglobin 10.4 WBC 13,300, lactic acid 0.5, SARS coronavirus negative, urinalysis negative nitrite, CK 56. CT of right lower extremity showed swelling edema at the distal stump with possible cutaneous blister. There was more focal hyperdense collection at the posterior lateral aspect of the distal stump  measuring approximately 2.6 x 1.7 x 2.6 cm possibly representing hematoma. MRI of right lower extremity showed no findings of osteomyelitis no drainable abscess. Renal ultrasound showed no abscess or hydronephrosis. Placed on vancomycin and cefepime for questionable early cellulitis. Hospital course bouts of A. fib RVR 120s to 130 blood pressures 90s/60s received fluid bolus follow-up cardiology services and placed on IV amiodarone transition to p.o. as well as digoxin. Follow-up renal services for progressive CKD stage IV necessitating need for dialysis initiated 01/04/2020 and arrangements have been made for ongoing outpatient dialysis on discharge with latest creatinine 4.14. Follow-up Dr. Sharol Given for cellulitis suspect osteomyelitis right below-knee amputation had no change with conservative care and underwent revision right BKA 01/12/2020 and initial placement of wound VAC that was later discontinued. Patient remains on Eliquis as prior to admission. Acute on chronic anemia 9.9 and monitored. Therapy evaluations completed and patient was admitted for a comprehensive rehab program..   Patient currently requires min with mobility secondary to  muscle weakness, decreased cardiorespiratoy endurance and decreased sitting balance and decreased balance strategies.  Prior to hospitalization, patient was modified independent  with mobility and lived with Spouse in a House home.  Home access is  Ramped entrance.  Patient will benefit from skilled PT intervention to maximize safe functional mobility, minimize fall risk and decrease caregiver burden for planned discharge home with intermittent assist.  Anticipate patient will benefit from follow up OP at discharge.  PT - End of Session Activity Tolerance: Tolerates 30+ min activity with multiple rests Endurance Deficit: Yes Endurance Deficit Description: patient requiring multiple rest breaks throughout eval PT Assessment Rehab Potential (ACUTE/IP ONLY):  Good PT Barriers to Discharge: Home environment access/layout;Hemodialysis;Weight bearing restrictions;Other (comments) (status with B BKA) PT Barriers to Discharge Comments: limited physical ability of wife to assist patient as needed PT Patient demonstrates impairments in the following area(s): Balance;Endurance;Motor;Pain;Skin Integrity PT Transfers Functional Problem(s): Bed Mobility;Bed to Chair;Car;Furniture PT Locomotion Functional Problem(s): Ambulation;Wheelchair Mobility;Stairs PT Plan PT Intensity: Minimum of 1-2 x/day ,45 to 90 minutes PT Frequency: 5 out of 7 days PT Duration Estimated Length of Stay: 7-10 days PT Treatment/Interventions: Ambulation/gait training;DME/adaptive equipment instruction;UE/LE Strength taining/ROM;Psychosocial support;Balance/vestibular training;Functional electrical stimulation;Skin care/wound management;UE/LE Coordination activities;Functional mobility training;Splinting/orthotics;Community reintegration;Neuromuscular re-education;Stair training;Wheelchair propulsion/positioning;Discharge planning;Pain management;Therapeutic Activities;Disease management/prevention;Patient/family education;Therapeutic Exercise PT Transfers Anticipated Outcome(s): grossly SPV at wc level PT Locomotion Anticipated Outcome(s): grossly SPV at wc level PT Recommendation Follow Up Recommendations: Outpatient PT;Home health PT Patient destination: Home Equipment Recommended: None recommended by PT (patient has all DME)   PT Evaluation Precautions/Restrictions Precautions Precautions: Fall;Other (comment) Precaution Comments: B BKA, no pillow under R knee Restrictions Weight Bearing Restrictions: Yes RLE Weight Bearing: Non weight bearing Pain Pain Assessment Pain Scale: 0-10 Pain Score: 2  Pain Type: Acute pain;Surgical pain Pain Location: Leg Pain Orientation: Right Pain Descriptors / Indicators: Aching Pain Onset: On-going Pain Intervention(s): Medication (See  eMAR);Repositioned Multiple Pain Sites: No Home Living/Prior Functioning Home Living Available Help at Discharge: Family;Available 24 hours/day;Available PRN/intermittently Type of Home: House Home Access: Ramped entrance Home Layout: Two level;Able to live on main level with bedroom/bathroom Alternate Level Stairs-Number of Steps: 1 step down into the living room Alternate Level Stairs-Rails: None Bathroom Shower/Tub: Estate agent Accessibility: Yes Additional Comments: was in OP PT  Lives With: Spouse Prior Function Level of Independence: Requires assistive device for independence Vision/Perception  Perception Perception: Within Functional Limits Praxis Praxis: Intact  Cognition Overall Cognitive Status: Within Functional Limits for tasks assessed Arousal/Alertness: Awake/alert Orientation Level: Oriented X4 Awareness: Appears intact Problem Solving: Appears intact Safety/Judgment: Appears intact Sensation Sensation Light Touch: Appears Intact Proprioception: Appears Intact Coordination Gross Motor Movements are Fluid and Coordinated: Yes Fine Motor Movements are Fluid and Coordinated: Yes Finger Nose Finger Test: intact Motor  Motor Motor: Within Functional Limits Motor - Skilled Clinical Observations: slowed d/t pain but [facility]   Trunk/Postural Assessment  Cervical Assessment Cervical Assessment: Within Functional Limits Thoracic Assessment Thoracic Assessment: Within Functional Limits Lumbar Assessment Lumbar Assessment: Exceptions to [facility] (posterior pelvic tilt) Postural Control Postural Control: Deficits on evaluation Righting Reactions: delayed Protective Responses: delayed Postural Limitations: limited ability to reach outside BOS in sitting without LOB  Balance Balance Balance Assessed: Yes Dynamic Sitting Balance Sitting balance - Comments: Requires CGA sitting EOM to complete dynamic task Extremity Assessment   RLE Assessment RLE Assessment: Exceptions to [facility] Passive Range of Motion (PROM) Comments: limited R knee extension d/t pain and edema, limited in hip extension d/t HF tightness General  Strength Comments: traditional MMTs deferred d/t surgical pain, however can move limb against gravity LLE Assessment LLE Assessment: Exceptions to [facility] Passive Range of Motion (PROM) Comments: WFL Active Range of Motion (AROM) Comments: WFL General Strength Comments: Grossly 3+/5 at hip  Care Tool Care Tool Bed Mobility Roll left and right activity   Roll left and right assist level: Contact Guard/Touching assist    Sit to lying activity   Sit to lying assist level: Contact Guard/Touching assist    Lying to sitting edge of bed activity   Lying to sitting edge of bed assist level: Contact Guard/Touching assist     Care Tool Transfers Sit to stand transfer Sit to stand activity did not occur: Safety/medical concerns (status as B BKA)      Chair/bed transfer   Chair/bed transfer assist level: Contact Guard/Touching assist     Toilet transfer   Assist Level: Contact Guard/Touching assist    Car transfer   Car transfer assist level: Contact Guard/Touching assist      Care Tool Locomotion Ambulation Ambulation activity did not occur: Safety/medical concerns (status as B BKA)        Walk 10 feet activity Walk 10 feet activity did not occur: Safety/medical concerns (status as B BKA)       Walk 50 feet with 2 turns activity Walk 50 feet with 2 turns activity did not occur: Safety/medical concerns (status as B BKA)      Walk 150 feet activity Walk 150 feet activity did not occur: Safety/medical concerns (status as B BKA)      Walk 10 feet on uneven surfaces activity Walk 10 feet on uneven surfaces activity did not occur: Safety/medical concerns (status as B BKA)      Stairs Stair activity did not occur: Safety/medical concerns (status as B BKA)        Walk up/down 1 step activity Walk  up/down 1 step or curb (drop down) activity did not occur: Safety/medical concerns (status as B BKA)        Walk up/down 4 steps activity      Walk up/down 12 steps activity Walk up/down 12 steps activity did not occur: Safety/medical concerns (status as B BKA)      Pick up small objects from floor Pick up small object from the floor (from standing position) activity did not occur: Safety/medical concerns (status as B BKA)      Wheelchair Will patient use wheelchair at discharge?: Yes     Wheelchair assist level: Supervision/Verbal cueing    Wheel 50 feet with 2 turns activity   Assist Level: Supervision/Verbal cueing  Wheel 150 feet activity   Assist Level: Supervision/Verbal cueing    Refer to Care Plan for Long Term Goals  SHORT TERM GOAL WEEK 1 PT Short Term Goal 1 (Week 1): STG=LTG based on ELOS  Recommendations for other services: None   Skilled Therapeutic Intervention Mobility Bed Mobility Bed Mobility: Sit to Supine;Supine to Sit Rolling Right: Supervision/verbal cueing Rolling Left: Supervision/Verbal cueing Supine to Sit: Supervision/Verbal cueing Sitting - Scoot to Edge of Bed: Minimal Assistance - Patient > 75% Sit to Supine: Contact Guard/Touching assist Transfers Transfers: Lateral/Scoot Transfers;Transfer Lateral/Scoot Transfers: Minimal Assistance - Patient > 75% Transfer (Assistive device): Other (Comment) (sliding board) Locomotion  Gait Ambulation: No Gait Gait: No Stairs / Additional Locomotion Stairs: No Wheelchair Mobility Wheelchair Mobility: Yes Wheelchair Assistance: Chartered loss adjuster: Both upper extremities Wheelchair Parts Management: Needs assistance Distance: 250  Patient received sitting up in  wc agreeable to PT eval with R LE unwrapped. He reports 2/10 "ache" in RLE. PT wrapped limb, patient declines any other pain interventions. Patient is grossly CGA-MinA for functional mobility. He states that he  does not want to put L prosthetic on yet and so was limited in his ability to demonstrate gait/ standing transfers or stair negotiation. Patient educated on CIR expectations, 3 hour rule, therapy team and conference. Patient will benefit from skilled PT services in order to progress independence with functional mobility including transfers and wheelchair mobility in order to minimize risk for falls and decrease care giver burden.   Discharge Criteria: Patient will be discharged from PT if patient refuses treatment 3 consecutive times without medical reason, if treatment goals not met, if there is a change in medical status, if patient makes no progress towards goals or if patient is discharged from hospital.  The above assessment, treatment plan, treatment alternatives and goals were discussed and mutually agreed upon: by patient  Debbora Dus 01/19/2020, 7:45 AM

## 2020-01-19 NOTE — Progress Notes (Addendum)
Pt arrived to 4w03 at 2100 from hemodialysis in hospital bed. Pt aware of call bell use and has no c/o pain at this time. All vital signs WNL. No other concerns to report at this time.

## 2020-01-19 NOTE — Progress Notes (Signed)
Physical Therapy Session Note  Patient Details  Name: Paul [name] MRN: [id number] Date of Birth: 02-03-1956  Today's Date: 01/19/2020 PT Individual Time: 4166-0630 PT Individual Time Calculation (min): 57 min   Short Term Goals:   PT Short Term Goal 1 (Week 1): STG=LTG based on ELOS  Skilled Therapeutic Interventions/Progress Updates:    Patient received sitting up in wc agreeable to PT. He reports 2/10 "ache" in R RL, but declines pain interventions. Patient able to propel himself from room to therapy gym ~285ft, but at slow pace. Transfer wc > therapy mat with SBA and SBT. Patient able to complete mat level therex: SLR (2.5#AW on L LE) 2x10, hip abd 2x10, sidelying hip ext 2x10. Patient completing dynamci sitting balance task sitting EOM with SPV. He requires multiple, frequent and extended rest breaks throughout therapy session d/t fatigue and poor endurance. Patient able to complete UE ergobike for 36mins fwd and 2 min retro sitting away from back of chair for improved core strengthening. Transferred back to bed with SBT and MinA d/t height of bed compared to chair. Bed alarm on, call light within reach.   Therapy Documentation Precautions:  Precautions Precautions: Fall, Other (comment) Precaution Comments: B BKA, no pillow under R knee Restrictions Weight Bearing Restrictions: Yes RLE Weight Bearing: Non weight bearing    Therapy/Group: Individual Therapy  Karoline Caldwell, PT, DPT, CBIS 01/19/2020, 7:50 AM

## 2020-01-19 NOTE — Patient Care Conference (Signed)
Inpatient RehabilitationTeam Conference and Plan of Care Update Date: 01/19/2020   Time: 10:52 AM    Patient Name: Paul [name]      Medical Record Number: [id number]  Date of Birth: [birth year] Sex: Male         Room/Bed: 4W03C/4W03C-01 Payor Info: Payor: CIGNA / Plan: CIGNA MANAGED / Product Type: *No Product type* /    Admit Date/Time:  01/18/2020  9:05 PM  Primary Diagnosis:  <principal problem not specified>  Hospital Problems: Active Problems:   Right below-knee amputee [facility])    Expected Discharge Date: Expected Discharge Date:  (7-10 days ELOS)  Team Members Present: Physician leading conference: Dr. Alysia Penna Care Coodinator Present: Dorien Chihuahua, RN, BSN, CRRN;Christina [place], [place] Nurse Present: Serena Croissant, LPN PT Present: Other (comment) Estevan Ryder, PT) OT Present: Clyda Greener, OT PPS Coordinator present : Ileana Ladd, Burna Mortimer, SLP     Current Status/Progress Goal Weekly Team Focus  Bowel/Bladder   pt inc of b and b lbm 01/19/20  become cont of b and b  assess q shift and prn   Swallow/Nutrition/ Hydration             ADL's   supervision for UB selfcare with min assist for LB bathing with lateral leans and simulated min to mod for LB dressing with lateral leans.  Min to mod assist for sliding board transfers simulated to drop arm commode with mod assist for clothing management  supervision overall  selfcare retraining, transfer training, balance retrianing, therapeutic exercise, pt education, therapeutic activities.   Mobility   pending PT eval         Communication             Safety/Cognition/ Behavioral Observations            Pain   pt comlains of pain in rt leg 7/10  decrease pai 3/10  Assess q shift and prn   Skin   stage 2 to bottom, amputatio to rt leg  decrease infection and breakdown  foam and dressing changes to bka     Discharge Planning:  Goal to discharge home spouse able to provide 24/7. 2 Level home (1 step  into living room, ramp emtrance, walk in shower)   Team Discussion: Kidney transplant recipient with HD. UE weakness limits mobility and transfers without use of LE protheses.  Patient on target to meet rehab goals: Yes; supervision goals set.  *See Care Plan and progress notes for long and short-term goals.   Revisions to Treatment Plan:  Initial evaluations pending  Teaching Needs: Transfers, toileting, medications, skin care, etc.   Current Barriers to Discharge: Hemodialysis and Weight bearing restrictions; limited use of prothesis.  Possible Resolutions to Barriers: Teaching lateral lean transfers using slideboard      Medical Summary Current Status: right BKA revision, Diabetic peripheral neuropathy, wound looks ok  Barriers to Discharge: Medical stability;Wound care   Possible Resolutions to Barriers/Weekly Focus: initiate rehab program   Continued Need for Acute Rehabilitation Level of Care: The patient requires daily medical management by a physician with specialized training in physical medicine and rehabilitation for the following reasons: Direction of a multidisciplinary physical rehabilitation program to maximize functional independence : Yes Medical management of patient stability for increased activity during participation in an intensive rehabilitation regime.: Yes Analysis of laboratory values and/or radiology reports with any subsequent need for medication adjustment and/or medical intervention. : Yes   I attest that I was present, lead the team conference,  and concur with the assessment and plan of the team.   Margarito Liner 01/19/2020, 1:56 PM

## 2020-01-20 ENCOUNTER — Inpatient Hospital Stay (HOSPITAL_COMMUNITY): Payer: Managed Care, Other (non HMO) | Admitting: Occupational Therapy

## 2020-01-20 ENCOUNTER — Inpatient Hospital Stay (HOSPITAL_COMMUNITY): Payer: Managed Care, Other (non HMO) | Admitting: Physical Therapy

## 2020-01-20 LAB — GLUCOSE, CAPILLARY
Glucose-Capillary: 76 mg/dL (ref 70–99)
Glucose-Capillary: 163 mg/dL — ABNORMAL HIGH (ref 70–99)
Glucose-Capillary: 113 mg/dL — ABNORMAL HIGH (ref 70–99)
Glucose-Capillary: 85 mg/dL (ref 70–99)
Glucose-Capillary: 161 mg/dL — ABNORMAL HIGH (ref 70–99)

## 2020-01-20 LAB — RENAL FUNCTION PANEL
Albumin: 2 g/dL — ABNORMAL LOW (ref 3.5–5.0)
Anion gap: 11 (ref 5–15)
BUN: 41 mg/dL — ABNORMAL HIGH (ref 8–23)
CO2: 21 mmol/L — ABNORMAL LOW (ref 22–32)
Calcium: 8 mg/dL — ABNORMAL LOW (ref 8.9–10.3)
Chloride: 106 mmol/L (ref 98–111)
Creatinine, Ser: 3.97 mg/dL — ABNORMAL HIGH (ref 0.61–1.24)
GFR calc Af Amer: 17 mL/min — ABNORMAL LOW (ref >60)
GFR calc non Af Amer: 15 mL/min — ABNORMAL LOW (ref >60)
Glucose, Bld: 124 mg/dL — ABNORMAL HIGH (ref 70–99)
Phosphorus: 4.2 mg/dL (ref 2.5–4.6)
Potassium: 4.7 mmol/L (ref 3.5–5.1)
Sodium: 138 mmol/L (ref 135–145)

## 2020-01-20 LAB — CBC
HCT: 27 % — ABNORMAL LOW (ref 39.0–52.0)
Hemoglobin: 8.5 g/dL — ABNORMAL LOW (ref 13.0–17.0)
MCH: 30.2 pg (ref 26.0–34.0)
MCHC: 31.5 g/dL (ref 30.0–36.0)
MCV: 96.1 fL (ref 80.0–100.0)
Platelets: 123 10*3/uL — ABNORMAL LOW (ref 150–400)
RBC: 2.81 MIL/uL — ABNORMAL LOW (ref 4.22–5.81)
RDW: 15.2 % (ref 11.5–15.5)
WBC: 7.6 10*3/uL (ref 4.0–10.5)
nRBC: 0 % (ref 0.0–0.2)

## 2020-01-20 MED ORDER — DOCUSATE SODIUM 100 MG PO CAPS
100.0000 mg | ORAL_CAPSULE | Freq: Every day | ORAL | Status: DC | PRN
  Administered 2020-01-22: 100 mg via ORAL
  Filled 2020-01-20: qty 1

## 2020-01-20 MED ORDER — DARBEPOETIN ALFA 60 MCG/0.3ML IJ SOSY
PREFILLED_SYRINGE | INTRAMUSCULAR | Status: AC
  Administered 2020-01-20: 60 ug via INTRAVENOUS
  Filled 2020-01-20: qty 0.3

## 2020-01-20 MED ORDER — LIVING WELL WITH DIABETES BOOK
Freq: Once | Status: AC
  Filled 2020-01-20: qty 1

## 2020-01-20 MED ORDER — NEPRO/CARBSTEADY PO LIQD
237.0000 mL | Freq: Three times a day (TID) | ORAL | Status: DC
  Administered 2020-01-20 – 2020-01-31 (×32): 237 mL via ORAL

## 2020-01-20 NOTE — Therapy (Signed)
[facility] [address] [place], [place], [address] Phone: [phone]   Fax:  [phone]  Patient Details  Name: Paul [name] MRN: [id number] Date of Birth: May 07, 1956 Referring Provider:  No ref. provider found   PHYSICAL THERAPY DISCHARGE SUMMARY/ Non visit discharge summary  Visits from Start of Care: 13  Current functional level related to goals / functional outcomes: Pt admitted to hospital due to infection in residual limb with surgery. Discharging from outpatient PT at this time. Below is where pt was towards goals at time of last visit.    Remaining deficits: Admitted to hospital with change in status   Education / Equipment:   Plan: Patient agrees to discharge.  Patient goals were not met. Patient is being discharged due to a change in medical status.  ?????         Encounter Date: 01/20/2020   PT Short Term Goals - 12/24/19 1438      PT SHORT TERM GOAL #1   Title Pt will decrease 5 x sit to stand from 52. 55 sec to <45 sec for improved balance and functional strength.    Baseline 52.55 sec from chair with hands    Time 4    Period Weeks    Status New    Target Date 01/23/20      PT SHORT TERM GOAL #2   Title Berg Balance >/= 36/56    Baseline 11/23/19 Berg Balance= 27/56 (inc. of 1 pt.), 12/22/19 33/56    Time 4    Period Weeks    Status On-going    Target Date 01/23/20      PT SHORT TERM GOAL #3   Title Pt will ambulate 200' with SPC and prostheses CGA for improved mobility.    Baseline 200' min assist    Time 4    Period Weeks    Status New    Target Date 01/23/20           PT Long Term Goals - 12/24/19 1440      PT LONG TERM GOAL #1   Title Patient verbalizes & demonstrates understanding of ongoing fitness plan including home exercises & fitness center exercise program.  (All LTGs Target Date: 12/17/2019)    Baseline Pt has been performing current HEP but has not returned to  fitness center program    Time 8    Period Weeks    Status On-going    Target Date 02/22/20      PT LONG TERM GOAL #2   Title Pt will increase Berg from 33/56 to >40/56 for decreased fall risk.    Baseline 33/56 on 12/22/19    Time 8    Period Weeks    Status Revised    Target Date 02/22/20      PT LONG TERM GOAL #3   Title Pt will be able to tolerate 10 min of standing activities without sitting to rest for improved activity tolerance and functional strength.    Time 8    Period Weeks    Status New    Target Date 02/22/20      PT LONG TERM GOAL #4   Title Patient ambulates >300' with cane & prostheses modified independent for improved mobility on level surfaces.    Baseline 200' with cane min and prostheses min assist.    Time 8    Period Weeks    Status Revised    Target Date 02/22/20  PT LONG TERM GOAL #5   Title Pt will negotiate ramp and curb with SPC CGA for improved safety in community.    Time 8    Period Weeks    Status New    Target Date 02/22/20            Electa Sniff, PT, DPT, NCS 01/20/2020, 11:46 AM  [facility] [address] [place], [place], [address] Phone: [phone]   Fax:  [phone]

## 2020-01-20 NOTE — Progress Notes (Signed)
Physical Therapy Session Note  Patient Details  Name: Paul [name] MRN: [id number] Date of Birth: 20-Feb-1956  Today's Date: 01/20/2020 PT Individual Time: 1133-1200 PT Individual Time Calculation (min): 27 min   Short Term Goals: Week 1:  PT Short Term Goal 1 (Week 1): STG=LTG based on ELOS  Skilled Therapeutic Interventions/Progress Updates: Pt presents sitting in w/c receiving med.  Pt agreeable to therapy.  Attempted to don L BKA, but unable to achieve w/ or w/o socks.  Pt donned personal shrinker and board placed back on w/c.  Wheeled pt to gym for time conservation.  Pt performed UBE at Level 2 for 5' x 2 w/o trunk support and encouragement for upright posture.  Pt handed off to PT for continued therapy.     Therapy Documentation Precautions:  Precautions Precautions: Fall Precaution Comments: B BKA, no pillow under R knee Restrictions Weight Bearing Restrictions: No RLE Weight Bearing: Non weight bearing General:   Vital Signs:  Pain: no c/o. Pain Assessment Pain Scale: Faces Faces Pain Scale: Hurts a little bit Pain Type: Surgical pain Pain Location: Leg Pain Orientation: Right Pain Descriptors / Indicators: Aching Mobility:      Therapy/Group: Individual Therapy  [name] 01/20/2020, 12:51 PM

## 2020-01-20 NOTE — Progress Notes (Signed)
Patient ID: DONTAVIAN MARCHI, male   DOB: Jul 05, 1955, 64 y.o.   MRN: [id number] Met with the patient to review role of nurse CM and review concerns and collaboration with SW to facilitate preparations for discharge in 7-10 days. Patient noted he has not been given a timeframe for use of prosthetic on revised limb however feels it is a lot to manage; transferring and standing on one leg due to weakness. Noted weight loss over the past year with multiple hospitalizations. Reported concerns with too much stool softeners; felt like he was on the bedpan all night. MD aware and adjusted medications. Also noted he was drinking Nepro supplements at home PTA; PA notified. Also given information regarding CMM diet as A1C = 10.3; realizing recent osteomyelitis diagnosis. Also given information on healthy eating for kidney disease with noted progressive CKD and HD initiation. Monitoring coccyx wound using foam dressing at present. No other issues noted at present. Margarito Liner

## 2020-01-20 NOTE — Progress Notes (Signed)
Patient Details  Name: Paul [name] MRN: [id number] Date of Birth: 22-Jul-1955  Today's Date: 01/20/2020  Hospital Problems: Active Problems:   Right below-knee amputee [facility])  Past Medical History:  Past Medical History:  Diagnosis Date  . A-fib ([place])   . Anemia    esrd  . Calciphylaxis 05/2013   lower ext  . Cancer ([place])    hx duodenal adenoCa 2002, resected  . Closed left arm fracture 1967  . Corneal abrasion, left   . Depression   . Diabetes mellitus    Type 1 iddm x 11 yrs  . Diabetic neuropathy ([place])   . ESRD on hemodialysis ([place])    Pt has ESRD due to DM.  He had a L upper arm AVF prior to starting HD which was ligated due to L arm steal syndrome.  He started HD in Jan 2014 and did home HD.  The family couldn't cannulate the R arm AVF successfully so by mid 2014 they decided to switch to PD which was done late summer 2014.  In Jan 2015 he was admitted with FTT felt to be due to underdialysis on PD and PD was abandoned and he   . GERD (gastroesophageal reflux disease)   . Glomerulosclerosis, diabetic ([place])   . Heart murmur   . History of blood transfusion   . Hypertension    off of meds due to orthostatic hypotension  . Kidney transplant recipient   . Open wound of both legs with complication    Pt has had progressive wounds of both LE's including gangrene of the toes and patchy necrosis of the calves.  He has been treated at [facility] by Dr Jerline Pain with hyperbaric O2 5d per week.  He is getting Na Thiosulfate with HD for suspected calciphylaxis. Pt says doctor's aren't sure if these ulcers were diabetic ulcers or calciphylaxis.  He underwent bilat BKA on 07/30/13.  He says that the woun   Past Surgical History:  Past Surgical History:  Procedure Laterality Date  . AMPUTATION Bilateral 07/30/2013   Procedure: AMPUTATION BELOW KNEE;  Surgeon: Newt Minion, MD;  Location: [facility];  Service: Orthopedics;  Laterality: Bilateral;  Bilateral Below Knee Amputations  .  AMPUTATION Bilateral 09/03/2013   Procedure: AMPUTATION BELOW KNEE;  Surgeon: Newt Minion, MD;  Location: [facility];  Service: Orthopedics;  Laterality: Bilateral;  Bilateral Below Knee Amputation Revision  . AV FISTULA PLACEMENT  06/07/2011   Procedure: ARTERIOVENOUS (AV) FISTULA CREATION;  Surgeon: Angelia Mould, MD;  Location: [facility];  Service: Vascular;  Laterality: Left;  . AV FISTULA PLACEMENT  06/18/2012   Procedure: ARTERIOVENOUS (AV) FISTULA CREATION;  Surgeon: Mal Misty, MD;  Location: [facility];  Service: Vascular;  Laterality: Right;  [place]  . BILIARY DIVERSION, EXTERNAL  2002  . BONE MARROW BIOPSY  2012  . CAPD INSERTION N/A 01/14/2013   Procedure: LAPAROSCOPIC INSERTION CONTINUOUS AMBULATORY PERITONEAL DIALYSIS  (CAPD) CATHETER;  Surgeon: Adin Hector, MD;  Location: [facility];  Service: General;  Laterality: N/A;  . CAPD REMOVAL N/A 07/09/2013   Procedure: CONTINUOUS AMBULATORY PERITONEAL DIALYSIS  (CAPD) CATHETER REMOVAL;  Surgeon: Adin Hector, MD;  Location: [facility];  Service: General;  Laterality: N/A;  . COLONOSCOPY     Hx: of  . INSERTION OF DIALYSIS CATHETER  05/22/2012   Procedure: INSERTION OF DIALYSIS CATHETER;  Surgeon: Mal Misty, MD;  Location: [facility];  Service: Vascular;  Laterality: N/A;  right internal jugular  vein  . IR ANGIO INTRA EXTRACRAN SEL COM CAROTID INNOMINATE BILAT MOD SED  10/28/2018  . IR ANGIO VERTEBRAL SEL SUBCLAVIAN INNOMINATE UNI R MOD SED  10/28/2018  . IR ANGIO VERTEBRAL SEL VERTEBRAL UNI L MOD SED  10/28/2018  . LIGATION OF ARTERIOVENOUS  FISTULA  05/22/2012   Procedure: LIGATION OF ARTERIOVENOUS  FISTULA;  Surgeon: Mal Misty, MD;  Location: [facility];  Service: Vascular;  Laterality: Left;  Left brachio-cephalic fistula  . LOWER EXTREMITY ANGIOGRAM N/A 06/14/2013   Procedure: LOWER EXTREMITY ANGIOGRAM;  Surgeon: Angelia Mould, MD;  Location: [facility] CATH LAB;  Service: Cardiovascular;  Laterality: N/A;  . NEPHRECTOMY TRANSPLANTED ORGAN    .  OTHER SURGICAL HISTORY     Cyst removed from back  . PORTACATH PLACEMENT  2002  . RADIOLOGY WITH ANESTHESIA N/A 10/28/2018   Procedure: IR WITH ANESTHESIA;  Surgeon: Radiologist, Medication, MD;  Location: [facility];  Service: Radiology;  Laterality: N/A;  . REMOVAL OF A DIALYSIS CATHETER    . RENAL BIOPSY, PERCUTANEOUS  2012  . STUMP REVISION Right 01/12/2020   Procedure: REVISION RIGHT BELOW KNEE AMPUTATION;  Surgeon: Newt Minion, MD;  Location: [facility];  Service: Orthopedics;  Laterality: Right;  . TONSILLECTOMY    . VASCULAR SURGERY    . WHIPPLE PROCEDURE  2002   duodenal ca, Dr. Harlow Asa   Social History:  reports that he has never smoked. He has never used smokeless tobacco. He reports that he does not drink alcohol and does not use drugs.  Family / Support Systems Patient Roles: Spouse Spouse/Significant Other: [name] Anticipated Caregiver: [name] (spouse) Ability/Limitations of Caregiver: None Caregiver Availability: 24/7  Social History Preferred language: English Religion: Christian Read: Yes Write: Yes   Abuse/Neglect Abuse/Neglect Assessment Can Be Completed: Yes Physical Abuse: Denies Verbal Abuse: Denies Sexual Abuse: Denies Exploitation of patient/patient's resources: Denies Self-Neglect: Denies  Emotional Status Pt's affect, behavior and adjustment status: no- history of depression Recent Psychosocial Issues: no Psychiatric History: no Substance Abuse History: no  Patient / Family Perceptions, Expectations & Goals Pt/Family understanding of illness & functional limitations: yes Premorbid pt/family roles/activities: Previously independent Anticipated changes in roles/activities/participation: Spouse able to asisst Pt/family expectations/goals: Goal to discharge home  Community Resources Premorbid Home Care/DME Agencies: Other (Comment) (CBG Meter, Wheelchait, Glasses, Advice worker) Transportation available at discharge: Spouse able to transport Resource  referrals recommended: Neuropsychology  Discharge Planning Living Arrangements: Spouse/significant other Support Systems: Spouse/significant other Type of Residence: Private residence (2 Level Home: set up on 1st floor. Ramp entrance, 1 step to enter living room) Insurance Resources: Multimedia programmer (specify) Psychologist, counselling) Financial Resources: SSD Money Management: Patient, Spouse Does the patient have any problems obtaining your medications?: No Home Management: Independent Patient/Family Preliminary Plans: Independent with asisst Care Coordinator Anticipated Follow Up Needs: [place] Additional Notes/Comments: HD Patient (TTS)- [facility] Expected length of stay: 7-10 Days  Clinical Impression Patient having lunch. Introduced self, explained role and process.  Will continue to follow up for questions and concerns.  Dyanne Iha 01/20/2020, 9:08 AM

## 2020-01-20 NOTE — Progress Notes (Signed)
Occupational Therapy Session Note  Patient Details  Name: Paul [name] MRN: [id number] Date of Birth: 09/04/1955  Today's Date: 01/20/2020 OT Individual Time: 3612-2449 OT Individual Time Calculation (min): 41 min    Short Term Goals: Week 1:  OT Short Term Goal 1 (Week 1): Short term goals equal to LTGs set at overall supervision level based on ELOS.  Skilled Therapeutic Interventions/Progress Updates:    Pt worked on toilet transfers and toileting during session.  He was able to complete sliding board transfer from the wheelchair to the drop arm commode with min assist.  Increased posterior LOB noted with scooting across the board, requiring slight assist from therapist.  He then completed removal of clothing with mod assist using lateral leans.  Noted bowel incontinence in the brief prior to getting his clothing all the way down.  He was able to complete toilet hygiene with min guard assist and lateral lean to the left on the bed.  He then worked on donning new brief and shorts with mod assist using lateral leans side to side.  Min assist was needed for transfer back to the wheelchair via sliding board as well and then he was able to wash his hands at the sink.  Discussed various options for toileting including anterior/posterior method from the bed onto the commode vs sliding board with placement of the left prosthesis.  Will continue to practice and problem solve in upcoming sessions.  Pt was left sitting up in the wheelchair with the call button and phone in reach and safety belt in place    Therapy Documentation Precautions:  Precautions Precautions: Fall Precaution Comments: B BKA, no pillow under R knee Restrictions Weight Bearing Restrictions: No RLE Weight Bearing: Non weight bearing  Pain: Pain Assessment Pain Scale: Faces Pain Score: 4  Faces Pain Scale: Hurts a little bit Pain Type: Surgical pain Pain Location: Leg Pain Orientation: Right Pain Descriptors / Indicators:  Aching ADL: See Care Tool Section for some details of mobility and selfcare  Therapy/Group: Individual Therapy  Raymir Frommelt OTR/L 01/20/2020, 10:48 AM

## 2020-01-20 NOTE — Progress Notes (Signed)
[facility] PHYSICAL MEDICINE & REHABILITATION PROGRESS NOTE   Subjective/Complaints:  Did well with PT on basic transfers with slide board, discussed home set up with PT, has small BR Also discussed  Diabetic management   ROS- neg CP, SOB, N/V/D   Objective:   No results found. Recent Labs    01/18/20 0553  WBC 9.5  HGB 9.9*  HCT 31.8*  PLT 144*   Recent Labs    01/18/20 0553  NA 136  K 5.1  CL 103  CO2 23  GLUCOSE 169*  BUN 47*  CREATININE 4.14*  CALCIUM 8.4*    Intake/Output Summary (Last 24 hours) at 01/20/2020 0750 Last data filed at 01/19/2020 2050 Gross per 24 hour  Intake 358 ml  Output --  Net 358 ml     Physical Exam: Vital Signs Blood pressure (!) 145/68, pulse 60, temperature 98.6 F (37 C), temperature source Oral, resp. rate 16, weight 69.3 kg, SpO2 98 %.  General: No acute distress Mood and affect are appropriate Heart: Regular rate and rhythm no rubs murmurs or extra sounds Lungs: Clear to auscultation, breathing unlabored, no rales or wheezes Abdomen: Positive bowel sounds, soft nontender to palpation, nondistended Extremities: No clubbing, cyanosis, or edema Skin: No evidence of breakdown, no evidence of rash, see R BKA image below Neurologic: Cranial nerves II through XII intact, motor strength is 5/5 in bilateral deltoid, bicep, tricep, grip, hip flexor  Musculoskeletal:well healed Left BKA short residual, RIght BKA image below       Assessment/Plan: 1. Functional deficits secondary to Right BKA revision, hx Left BKA which require 3+ hours per day of interdisciplinary therapy in a comprehensive inpatient rehab setting.  Physiatrist is providing close team supervision and 24 hour management of active medical problems listed below.  Physiatrist and rehab team continue to assess barriers to discharge/monitor patient progress toward functional and medical goals  Care Tool:  Bathing    Body parts bathed by patient: Right arm, Left  arm, Chest, Abdomen, Right upper leg, Left upper leg, Face     Body parts n/a: Front perineal area, Buttocks, Right lower leg, Left lower leg (did not attempt secondary to completing earlier with nursing per his report/ LEs NA)   Bathing assist Assist Level: Minimal Assistance - Patient > 75%     Upper Body Dressing/Undressing Upper body dressing   What is the patient wearing?: Pull over shirt    Upper body assist Assist Level: Set up assist    Lower Body Dressing/Undressing Lower body dressing      What is the patient wearing?: Incontinence brief     Lower body assist Assist for lower body dressing: Minimal Assistance - Patient > 75%     Toileting Toileting    Toileting assist Assist for toileting: Moderate Assistance - Patient 50 - 74%     Transfers Chair/bed transfer  Transfers assist     Chair/bed transfer assist level: Contact Guard/Touching assist     Locomotion Ambulation   Ambulation assist   Ambulation activity did not occur: Safety/medical concerns (status as B BKA)          Walk 10 feet activity   Assist  Walk 10 feet activity did not occur: Safety/medical concerns (status as B BKA)        Walk 50 feet activity   Assist Walk 50 feet with 2 turns activity did not occur: Safety/medical concerns (status as B BKA)         Walk 150 feet  activity   Assist Walk 150 feet activity did not occur: Safety/medical concerns (status as B BKA)         Walk 10 feet on uneven surface  activity   Assist Walk 10 feet on uneven surfaces activity did not occur: Safety/medical concerns (status as B BKA)         Wheelchair     Assist Will patient use wheelchair at discharge?: Yes Type of Wheelchair: Manual    Wheelchair assist level: Supervision/Verbal cueing Max wheelchair distance: 250    Wheelchair 50 feet with 2 turns activity    Assist        Assist Level: Supervision/Verbal cueing   Wheelchair 150 feet activity      Assist      Assist Level: Supervision/Verbal cueing   Blood pressure (!) 145/68, pulse 60, temperature 98.6 F (37 C), temperature source Oral, resp. rate 16, weight 69.3 kg, SpO2 98 %.    Medical Problem List and Plan: 1.  Decreased functional ability secondary to cellulitis/osteomyelitis right BKA with history of bilateral BKA.  Status post right BKA revision 01/12/2020.  Wound VAC has been discontinued             -patient may  Shower if R BKA wrapped             -ELOS/Goals: 5-9 days- mod I to supervision 2.  Antithrombotics: -DVT/anticoagulation: Eliquis             -antiplatelet therapy: Aspirin 81 mg daily 3. Pain Management: Oxycodone as needed 4. Mood: Zoloft 50 mg daily.  Provide emotional support             -antipsychotic agents: N/A 5. Neuropsych: This patient is capable of making decisions on his own behalf. 6. Skin/Wound Care: Routine skin checks 7. Fluids/Electrolytes/Nutrition: Routine in and outs with follow-up chemistries 8.  Acute kidney injury on CKD stage IV with history of kidney transplant 2016.  Long-term dialysis now initiated 01/04/2020.  Outpatient dialysis set up for Tuesday Thursday Saturday at [facility] dialysis center.  Continue low-dose chronic prednisone, Myfortic 180 mg twice daily as well as Prograf 2 mg twice daily 9.  Chronic anemia.  Follow-up CBC.  Continue Aranesp 10.  Diabetes mellitus with peripheral neuropathy (on chronic prednisone).  Hemoglobin A1c CBG (last 3)  Recent Labs    01/19/20 1654 01/19/20 2137 01/20/20 0610  GLUCAP 80 71 76  controlled 9/2 but is on low side    NovoLog 2 units 3 times daily does not use at home will just use SSI , Lantus insulin 8 units dail may need to dial back if am CBG remains low  11.  Atrial fibrillation.  Amiodarone as directed.  Continue Eliquis.  Cardiac rate controlled 12.  Hyperlipidemia.  Lipitor 13. Stage II sacral pressure ulcer- on admission- will need to con't foam dressing, keep  pt off backside unless eating or doing therapy- might benefit from air mattress if won't impair transfers.   14.  Loose stools will reduce colace to prn  LOS: 2 days A FACE TO FACE EVALUATION WAS PERFORMED  Charlett Blake 01/20/2020, 7:50 AM

## 2020-01-20 NOTE — Plan of Care (Signed)
°  Problem: Consults Goal: RH GENERAL PATIENT EDUCATION Description: See Patient Education module for education specifics. Outcome: Progressing Goal: Skin Care Protocol Initiated - if Braden Score 18 or less Description: If consults are not indicated, leave blank or document N/A Outcome: Progressing Goal: Nutrition Consult-if indicated Outcome: Progressing Goal: Diabetes Guidelines if Diabetic/Glucose > 140 Description: If diabetic or lab glucose is > 140 mg/dl - Initiate Diabetes/Hyperglycemia Guidelines & Document Interventions  Outcome: Progressing   Problem: RH BOWEL ELIMINATION Goal: RH STG MANAGE BOWEL WITH ASSISTANCE Description: STG Manage Bowel with mod I Assistance. Outcome: Progressing   Problem: RH BLADDER ELIMINATION Goal: RH STG MANAGE BLADDER WITH ASSISTANCE Description: STG Manage Bladder With mod I Assistance Outcome: Progressing   Problem: RH SKIN INTEGRITY Goal: RH STG SKIN FREE OF INFECTION/BREAKDOWN Description: Manage skin with min assist Outcome: Progressing Goal: RH STG MAINTAIN SKIN INTEGRITY WITH ASSISTANCE Description: STG Maintain Skin Integrity With mod I Assistance. Outcome: Progressing Goal: RH STG ABLE TO PERFORM INCISION/WOUND CARE W/ASSISTANCE Description: STG Able To Perform Incision/Wound Care With mod I Assistance. Outcome: Progressing   Problem: RH SAFETY Goal: RH STG ADHERE TO SAFETY PRECAUTIONS W/ASSISTANCE/DEVICE Description: STG Adhere to Safety Precautions With mod I Assistance/Device. Outcome: Progressing   Problem: RH PAIN MANAGEMENT Goal: RH STG PAIN MANAGED AT OR BELOW PT'S PAIN GOAL Outcome: Progressing   Problem: RH KNOWLEDGE DEFICIT GENERAL Goal: RH STG INCREASE KNOWLEDGE OF SELF CARE AFTER HOSPITALIZATION Description: Patient will be able to manage care at discharge independently Outcome: Progressing

## 2020-01-20 NOTE — Progress Notes (Signed)
Physical Therapy Session Note  Patient Details  Name: Paul [name] MRN: [id number] Date of Birth: April 06, 1956  Today's Date: 01/20/2020 PT Individual Time: 0803-0900 PT Individual Time Calculation (min): 57 min   and  Today's Date: 01/20/2020 PT Missed Time: 60 Minutes Missed Time Reason: Unavailable (Comment) (dialysis)  Short Term Goals: Week 1:  PT Short Term Goal 1 (Week 1): STG=LTG based on ELOS  Skilled Therapeutic Interventions/Progress Updates:    Session 1: Pt received sitting in w/c with RN and NT present for medication administration. B UE w/c propulsion ~118ft to main therapy gym with supervision - cuing for improved propulsion technique to decrease friction with minimal improvement. Pt states his wife recalls having to take of the door to the toilet room to allow increased w/c access - would perform anterior transfer out of w/c onto toilet and sit backwards on toilet - notified Jeneen Rinks, [name]. Pt reports having difficulty recalling how he performed tasks at w/c level in the past. Pt reports difficulty of standing on 1 leg in the past due to his impaired balance and core strength; however, pt reports he would like to work towards short distance ambulation. R slide board transfer w/c>EOM with supervision for board placement and CGA/min assist for scooting with tactile cuing for anterior trunk lean. Supine<>sit on flat mat table with supervision and increased time for trunk upright/descent. Supine hip flexor stretch 52minutes. Circuit style B UE strengthening including 2 sets of the following: - 1kg ball Russian trunk rotations x10 reps - 1kg ball PNF diagonals x10 reps each direction - light weight ball toss into rebounder x10 reps - scapular retractions against level 3 theraband resistance x10 reps - seated triceps extensions against level 3 threrband resistance  L sidelying R LE strengthening of hip abduction and hip extension x10 reps each - cuing for proper form/technique. L slide board  transfer to w/c with supervision for board placement (cuing to use shorts to cover legs for skin protection) and CGA for steadying while scooting (not able to clear hips and pt attempts to use legs to push down through with cuing to maintain R LE NWB). Transported back to room with MD present for morning assessment. Left seated in w/c with needs in reach and seat belt alarm on.  Session 2: Missed 60 minutes of skilled physical therapy due to pt being off the floor for dialysis. (made up 30 minutes of this time earlier in the day)   Therapy Documentation Precautions:  Precautions Precautions: Fall Precaution Comments: B BKA, no pillow under R knee Restrictions Weight Bearing Restrictions: Yes RLE Weight Bearing: Non weight bearing  Pain:   Session 1: Varies from 2-3/10 up to 5-6/10 with pt having "waves" of pain.    Therapy/Group: Individual Therapy  Tawana Scale , PT, DPT, CSRS  01/20/2020, 7:53 AM

## 2020-01-20 NOTE — Progress Notes (Signed)
Physical Therapy Session Note  Patient Details  Name: Paul [name] MRN: [id number] Date of Birth: 1956/01/31  Today's Date: 01/20/2020 PT Individual Time: 1200-1230 PT Individual Time Calculation (min): 30 min   Short Term Goals: Week 1:  PT Short Term Goal 1 (Week 1): STG=LTG based on ELOS Week 2:    Week 3:     Skilled Therapeutic Interventions/Progress Updates:    PAIN rates as 2/5, rest as needed.  Pt handed off in gym from last PT session.  Pt received on UBE, completed.  Noted limb acewrapped in cylindrical fashion w/singe wrap below knee.   Removed acewrap but maintained dressing and rewrapped limb using figure 8 technique and 2 wraps extending above knee joint.  Educated pt on purpose and importance of acewrapping for limb shaping/edema control.  Pt then instructed w/A/P transfer from wc to mat and performed w/cga and cues for hand placment/sequencing/boosting.  Sit to prone w/cga. In prone performed alt bilat hip extension 2x10 r knee flexion/extension x 20 ROM limited by pain.  Pone to supine to sit w/min assist for balance. Transferred to wc via post boosting w/cga and cues. Pt transported back to room at end of session and left oob in wc w/lunch set up.   Therapy Documentation Precautions:  Precautions Precautions: Fall Precaution Comments: B BKA, no pillow under R knee Restrictions Weight Bearing Restrictions: No RLE Weight Bearing: Non weight bearing    Therapy/Group: Individual Therapy  Callie Fielding, PT   [name] 01/20/2020, 12:39 PM

## 2020-01-20 NOTE — Progress Notes (Signed)
[facility] ROUNDING NOTE   Subjective:   Brief narrative: 64 year old gentleman with a history of renal transplant 2016 CKD stage IV, hypertension insulin-dependent diabetes atrial fibrillation status post bilateral BKA secondary to PVD with new onset right BKA stump pain.  Patient was admitted 12/30/2019.  Underwent stump revision for right stump osteomyelitis and abscess 01/12/2020  Baseline creatinine 2.8 to 3.4 mg/dL.  Started dialysis during admission for progression of his renal disease to end-stage renal disease.  There has been no signs of renal recovery.  He has been clipped to the outpatient dialysis center of [facility].  He will continue on a TTS schedule.  He had a successful dialysis treatment 01/18/2020 with 2.8 L removed.  His next dialysis will be planned for 01/20/2020  Blood pressure 145/68 pulse 60 temperature 98.6 O2 sats 98% room air  Labs: Sodium 136 potassium 5.1 chloride 103 CO2 23 BUN 47 creatinine 4.14 glucose 169 calcium 8.4 phosphorus 4.3 albumin 2.1 hemoglobin 9.9  Amiodarone 200 mg daily, amlodipine 5 mg daily, aspirin 81 mg daily, atorvastatin 10 mg daily, Eliquis 5 mg twice daily, vitamin D3 4000 units daily, Aranesp 60 mcg every Thursday  , insulin sliding scale, insulin Lantus 8 units daily, mycophenolate 180 mg twice daily, Protonix 40 mg daily, prednisone 5 mg daily, Zoloft 50 mg daily, tacrolimus 2 mg twice daily, Flomax 0.4 mg daily.  Creon 12,000 units 3 times daily   Objective:  Vital signs in last 24 hours:  Temp:  [97.9 F (36.6 C)-98.6 F (37 C)] 98.6 F (37 C) (09/02 0503) Pulse Rate:  [57-60] 60 (09/02 0503) Resp:  [16] 16 (09/02 0503) BP: (135-170)/(60-68) 145/68 (09/02 0503) SpO2:  [98 %-100 %] 98 % (09/02 0503)  Weight change:  Filed Weights   01/18/20 2242  Weight: 69.3 kg    Intake/Output: I/O last 3 completed shifts: In: 89 [P.O.:478] Out: 175 [Urine:175]   Intake/Output this shift:  Total I/O In: 350  [P.O.:350] Out: -   General: Not in distress, comfortable Heart:RRR, s1s2 nl, no rub Lungs: Clear b/l, no crackle Abdomen:soft, Non-tender, non-distended Extremities: Bilateral BKA, right BKA site has bandage applied. Dialysis Access: Right AV fistula has good thrill and bruit   Basic Metabolic Panel: Recent Labs  Lab 01/14/20 1115 01/14/20 1115 01/15/20 1436 01/16/20 0538 01/18/20 0553  NA 137  --  136 141 136  K 4.2  --  4.8 4.5 5.1  CL 103  --  104 105 103  CO2 27  --  25 28 23   GLUCOSE 195*  --  209* 255* 169*  BUN 17  --  36* 19 47*  CREATININE 2.31*  --  3.59* 2.22* 4.14*  CALCIUM 7.8*   < > 7.9* 8.0* 8.4*  PHOS 2.7  --  3.7 2.9 4.3   < > = values in this interval not displayed.    Liver Function Tests: Recent Labs  Lab 01/14/20 1115 01/15/20 1436 01/16/20 0538 01/18/20 0553  ALBUMIN 1.8* 1.7* 1.8* 2.1*   No results for input(s): LIPASE, AMYLASE in the last 168 hours. No results for input(s): AMMONIA in the last 168 hours.  CBC: Recent Labs  Lab 01/14/20 1115 01/15/20 0030 01/16/20 0547 01/17/20 0057 01/18/20 0553  WBC 9.7 9.7 9.0 9.5 9.5  HGB 5.9* 8.3* 8.9* 8.2* 9.9*  HCT 19.6* 25.9* 29.0* 26.8* 31.8*  MCV 94.7 91.5 94.5 94.0 92.4  PLT 116* 124* 122* 116* 144*    Cardiac Enzymes: No results for input(s): CKTOTAL,  CKMB, CKMBINDEX, TROPONINI in the last 168 hours.  BNP: Invalid input(s): POCBNP  CBG: Recent Labs  Lab 01/19/20 1120 01/19/20 1654 01/19/20 2137 01/20/20 0610 01/20/20 0806  GLUCAP 267* 80 71 76 163*    Microbiology: Results for orders placed or performed during the hospital encounter of 12/30/19  SARS Coronavirus 2 by RT PCR (hospital order, performed in [facility] hospital lab) Nasopharyngeal Nasopharyngeal Swab     Status: None   Collection Time: 12/31/19 11:15 AM   Specimen: Nasopharyngeal Swab  Result Value Ref Range Status   SARS Coronavirus 2 NEGATIVE NEGATIVE Final    Comment: (NOTE) SARS-CoV-2 target nucleic  acids are NOT DETECTED.  The SARS-CoV-2 RNA is generally detectable in upper and lower respiratory specimens during the acute phase of infection. The lowest concentration of SARS-CoV-2 viral copies this assay can detect is 250 copies / mL. A negative result does not preclude SARS-CoV-2 infection and should not be used as the sole basis for treatment or other patient management decisions.  A negative result may occur with improper specimen collection / handling, submission of specimen other than nasopharyngeal swab, presence of viral mutation(s) within the areas targeted by this assay, and inadequate number of viral copies (<250 copies / mL). A negative result must be combined with clinical observations, patient history, and epidemiological information.  Fact Sheet for Patients:   StrictlyIdeas.no  Fact Sheet for Healthcare Providers: BankingDealers.co.za  This test is not yet approved or  cleared by the [place] FDA and has been authorized for detection and/or diagnosis of SARS-CoV-2 by FDA under an Emergency Use Authorization (EUA).  This EUA will remain in effect (meaning this test can be used) for the duration of the COVID-19 declaration under Section 564(b)(1) of the Act, 21 U.S.C. section 360bbb-3(b)(1), unless the authorization is terminated or revoked sooner.  Performed at [facility], [place] [address], [place], [place] [address]   Surgical PCR screen     Status: Abnormal   Collection Time: 01/11/20 10:38 PM   Specimen: Nasal Mucosa; Nasal Swab  Result Value Ref Range Status   MRSA, PCR NEGATIVE NEGATIVE Final   Staphylococcus aureus POSITIVE (A) NEGATIVE Final    Comment: (NOTE) The Xpert SA Assay (FDA approved for NASAL specimens in patients 78 years of age and older), is one component of a comprehensive surveillance program. It is not intended to diagnose infection nor to guide or monitor treatment. Performed  at [facility], [place] [address], [place], [place] [address]     Coagulation Studies: No results for input(s): LABPROT, INR in the last 72 hours.  Urinalysis: No results for input(s): COLORURINE, LABSPEC, PHURINE, GLUCOSEU, HGBUR, BILIRUBINUR, KETONESUR, PROTEINUR, UROBILINOGEN, NITRITE, LEUKOCYTESUR in the last 72 hours.  Invalid input(s): APPERANCEUR    Imaging: No results found.   Medications:    . amiodarone  200 mg Oral Daily  . amLODipine  5 mg Oral Daily  . apixaban  5 mg Oral BID  . aspirin EC  81 mg Oral Daily  . atorvastatin  10 mg Oral Q M,W,F  . bisacodyl  10 mg Oral Daily  . Chlorhexidine Gluconate Cloth  6 each Topical Q0600  . cholecalciferol  4,000 Units Oral Daily  . darbepoetin (ARANESP) injection - DIALYSIS  60 mcg Intravenous Q Thu-HD  . insulin aspart  0-9 Units Subcutaneous TID WC  . insulin aspart  2 Units Subcutaneous TID WC  . insulin glargine  8 Units Subcutaneous Daily  . lipase/protease/amylase  12,000 Units Oral TID AC  . magnesium chloride  2 tablet Oral BID  . mycophenolate  180 mg Oral BID  . pantoprazole  40 mg Oral Daily  . polyethylene glycol  17 g Oral Daily  . predniSONE  5 mg Oral Q breakfast  . sertraline  50 mg Oral Daily  . tacrolimus  2 mg Oral BID  . tamsulosin  0.4 mg Oral QPC supper   acetaminophen **OR** acetaminophen, docusate sodium, oxyCODONE  Assessment/ Plan:  1. Acute kidney injury on CKD4, now progressed to ESRD: BL cr 2.8-3.4 s/p DDRT with DGF complicated by ACR and antibody mediated rejection requiring IVIG as recently as 2018.  Started dialysis 01/04/2020   No sign of renal recovery therefore we will continue TTS schedule for dialysis.  He has outpatient dialysis set up for TTS at [facility]. Patient has functional dialysis access.  Status post dialysis 01/18/2020 with 2.3 L ultrafiltered no complications.  Next dialysis treatment will be planned for 01/20/2020  2. Kidney transplant: Continue tacrolimus,  mycophenolate, prednisone-tac tapered to 2/2 on 8/18->mycophenolate tapered to 180 mg twice daily on 8/22.  Pharmacy ordering tacrolimus level since patient is on amiodarone, follow-up.  Last tacrolimus level was less than 1 12/31/2019  3. Hypertension/volume: Continue amlodipine.  Managing volume with dialysis.  4. Secondary hyperparathyroidism/hyperphosphatemia: PTH level 43.  Phosphorus level improving, off binders, monitor lab.  5. Anemia of CKD, acute blood loss anemia: Received blood transfusion. Continue Aranesp  Iron saturation 81 Monitor hemoglobin and transfuse as needed.  6. Severe sepsis due to right leg osteomyelitis/abscess on BKA stump site: Treated with antibiotics.  Status post revision of right below-knee amputation by Dr. Sharol Given on 8/25.    Transfered to CIR   LOS: [place] @TODAY @8 :28 AM

## 2020-01-21 ENCOUNTER — Inpatient Hospital Stay (HOSPITAL_COMMUNITY): Payer: Managed Care, Other (non HMO) | Admitting: Physical Therapy

## 2020-01-21 ENCOUNTER — Inpatient Hospital Stay (HOSPITAL_COMMUNITY): Payer: Managed Care, Other (non HMO) | Admitting: Occupational Therapy

## 2020-01-21 ENCOUNTER — Ambulatory Visit: Payer: Managed Care, Other (non HMO) | Admitting: Physical Therapy

## 2020-01-21 LAB — GLUCOSE, CAPILLARY
Glucose-Capillary: 87 mg/dL (ref 70–99)
Glucose-Capillary: 152 mg/dL — ABNORMAL HIGH (ref 70–99)
Glucose-Capillary: 111 mg/dL — ABNORMAL HIGH (ref 70–99)
Glucose-Capillary: 167 mg/dL — ABNORMAL HIGH (ref 70–99)

## 2020-01-21 MED ORDER — CHLORHEXIDINE GLUCONATE CLOTH 2 % EX PADS: 6.0000 | MEDICATED_PAD | Freq: Every day | CUTANEOUS | Status: DC

## 2020-01-21 NOTE — Progress Notes (Addendum)
[place] PHYSICAL MEDICINE & REHABILITATION PROGRESS NOTE   Subjective/Complaints:  No issues overnite, had questions regarding reduced mycophenolate and tacrolimus dose , directed pt to Nephro  ROS- neg CP, SOB, N/V/D   Objective:   No results found. Recent Labs    01/20/20 1400  WBC 7.6  HGB 8.5*  HCT 27.0*  PLT 123*   Recent Labs    01/20/20 1400  NA 138  K 4.7  CL 106  CO2 21*  GLUCOSE 124*  BUN 41*  CREATININE 3.97*  CALCIUM 8.0*    Intake/Output Summary (Last 24 hours) at 01/21/2020 0838 Last data filed at 01/21/2020 0817 Gross per 24 hour  Intake 390 ml  Output 2975 ml  Net -2585 ml     Physical Exam: Vital Signs Blood pressure (!) 148/65, pulse (!) 56, temperature 98.9 F (37.2 C), resp. rate 16, weight 69 kg, SpO2 100 %.  General: No acute distress Mood and affect are appropriate Heart: Regular rate and rhythm no rubs murmurs or extra sounds Lungs: Clear to auscultation, breathing unlabored, no rales or wheezes Abdomen: Positive bowel sounds, soft nontender to palpation, nondistended Extremities: No clubbing, cyanosis, or edema Skin: No evidence of breakdown, no evidence of rash, see R BKA image below Neurologic: Cranial nerves II through XII intact, motor strength is 5/5 in bilateral deltoid, bicep, tricep, grip, hip flexor  Musculoskeletal:well healed Left BKA short residual, RIght BKA image below       Assessment/Plan: 1. Functional deficits secondary to Right BKA revision, hx Left BKA which require 3+ hours per day of interdisciplinary therapy in a comprehensive inpatient rehab setting.  Physiatrist is providing close team supervision and 24 hour management of active medical problems listed below.  Physiatrist and rehab team continue to assess barriers to discharge/monitor patient progress toward functional and medical goals  Care Tool:  Bathing    Body parts bathed by patient: Right arm, Left arm, Chest, Abdomen, Right upper leg,  Left upper leg, Face     Body parts n/a: Front perineal area, Buttocks, Right lower leg, Left lower leg (did not attempt secondary to completing earlier with nursing per his report/ LEs NA)   Bathing assist Assist Level: Minimal Assistance - Patient > 75%     Upper Body Dressing/Undressing Upper body dressing   What is the patient wearing?: Pull over shirt    Upper body assist Assist Level: Set up assist    Lower Body Dressing/Undressing Lower body dressing      What is the patient wearing?: Incontinence brief     Lower body assist Assist for lower body dressing: Minimal Assistance - Patient > 75%     Toileting Toileting    Toileting assist Assist for toileting: Moderate Assistance - Patient 50 - 74%     Transfers Chair/bed transfer  Transfers assist     Chair/bed transfer assist level: Contact Guard/Touching assist (slide board)     Locomotion Ambulation   Ambulation assist   Ambulation activity did not occur: Safety/medical concerns (status as B BKA)          Walk 10 feet activity   Assist  Walk 10 feet activity did not occur: Safety/medical concerns (status as B BKA)        Walk 50 feet activity   Assist Walk 50 feet with 2 turns activity did not occur: Safety/medical concerns (status as B BKA)         Walk 150 feet activity   Assist Walk 150 feet activity  did not occur: Safety/medical concerns (status as B BKA)         Walk 10 feet on uneven surface  activity   Assist Walk 10 feet on uneven surfaces activity did not occur: Safety/medical concerns (status as B BKA)         Wheelchair     Assist Will patient use wheelchair at discharge?: Yes Type of Wheelchair: Manual    Wheelchair assist level: Supervision/Verbal cueing, Set up assist Max wheelchair distance: 149ft    Wheelchair 50 feet with 2 turns activity    Assist        Assist Level: Supervision/Verbal cueing, Set up assist   Wheelchair 150 feet  activity     Assist      Assist Level: Set up assist, Supervision/Verbal cueing   Blood pressure (!) 148/65, pulse (!) 56, temperature 98.9 F (37.2 C), resp. rate 16, weight 69 kg, SpO2 100 %.    Medical Problem List and Plan: 1.  Decreased functional ability secondary to cellulitis/osteomyelitis right BKA with history of bilateral BKA.  Status post right BKA revision 01/12/2020.  Wound VAC has been discontinued             -patient may  Shower if R BKA wrapped             -ELOS/Goals: 5-9 days- mod I to supervision 2.  Antithrombotics: -DVT/anticoagulation: Eliquis             -antiplatelet therapy: Aspirin 81 mg daily 3. Pain Management: Oxycodone as needed 4. Mood: Zoloft 50 mg daily.  Provide emotional support             -antipsychotic agents: N/A 5. Neuropsych: This patient is capable of making decisions on his own behalf. 6. Skin/Wound Care: Routine skin checks 7. Fluids/Electrolytes/Nutrition: Routine in and outs with follow-up chemistries 8.  Acute kidney injury on CKD stage IV with history of kidney transplant 2016.  Long-term dialysis now initiated 01/04/2020.  Outpatient dialysis set up for Tuesday Thursday Saturday at [facility] dialysis center.  Continue low-dose chronic prednisone, Myfortic 180 mg twice daily as well as Prograf 2 mg twice daily 9.  Chronic anemia.  Follow-up CBC.  Continue Aranesp 10.  Diabetes mellitus with peripheral neuropathy (on chronic prednisone).  Hemoglobin A1c CBG (last 3)  Recent Labs    01/20/20 1831 01/20/20 2055 01/21/20 0618  GLUCAP 85 161* 87  controlled 9/2 but is on low side    NovoLog 2 units 3 times daily does not use at home will just use SSI , Lantus insulin 8 units dail may need to dial back if am CBG remains low - will cont current regimen of Lantus 8U and SSI for now  11.  Atrial fibrillation.  Amiodarone as directed.  Continue Eliquis.  Cardiac rate controlled 12.  Hyperlipidemia.  Lipitor 13. Stage II sacral  pressure ulcer- on admission- will need to con't foam dressing, keep pt off backside unless eating or doing therapy- might benefit from air mattress if won't impair transfers.   14.  Loose stools will reduce colace to prn, held MIralax yesterday  LOS: 3 days A FACE TO FACE EVALUATION WAS PERFORMED  Charlett Blake 01/21/2020, 8:38 AM

## 2020-01-21 NOTE — Progress Notes (Signed)
Occupational Therapy Session Note  Patient Details  Name: Paul [name] MRN: [id number] Date of Birth: 1955-11-20  Today's Date: 01/21/2020 OT Individual Time: 6389-3734 OT Individual Time Calculation (min): 48 min    Short Term Goals: Week 1:  OT Short Term Goal 1 (Week 1): Short term goals equal to LTGs set at overall supervision level based on ELOS.  Skilled Therapeutic Interventions/Progress Updates:    Pt worked on bathing and dressing during session.  He was up in the wheelchair to start, so UB bathing and dressing was completed in sitting at the sink with setup.  He then transferred anteriorly with min guard assist to the bed for completion of LB selfcare.  Setup was needed for washing his LB in supine rolling side to side with supervision as well for donning brief and shorts.  Min guard assist was required for transfer from supine to sit EOB with posterior transfer completed at the same level back to the wheelchair.  He was then educated on BUE shoulder exercises for strengthening using the light orange level 1 resistance band.  He completed 1 set of 10 reps for shoulder flexion with occasional min assist for technique as well as shoulder horizontal abduction and external rotation.  He finished session in the wheelchair with the call button and phone in reach and safety belt in place.     Therapy Documentation Precautions:  Precautions Precautions: Fall Precaution Comments: B BKA, no pillow under R knee Restrictions Weight Bearing Restrictions: Yes RLE Weight Bearing: Non weight bearing  Pain: Pain Assessment Pain Scale: Faces Faces Pain Scale: Hurts a little bit Pain Type: Surgical pain Pain Location: Leg Pain Orientation: Right Pain Descriptors / Indicators: Discomfort Pain Onset: With Activity Pain Intervention(s): Repositioned ADL: See Care Tool Section for some details of selfcare and mobility  Therapy/Group: Individual Therapy  [name] OTR/L 01/21/2020, 12:23  PM

## 2020-01-21 NOTE — Progress Notes (Signed)
[facility] ROUNDING NOTE   Subjective:   Brief narrative: 64 year old gentleman with a history of renal transplant 2016 CKD stage IV, hypertension insulin-dependent diabetes atrial fibrillation status post bilateral BKA secondary to PVD with new onset right BKA stump pain.  Patient was admitted 12/30/2019.  Underwent stump revision for right stump osteomyelitis and abscess 01/12/2020  Baseline creatinine 2.8 to 3.4 mg/dL.  Started dialysis during admission for progression of his renal disease to end-stage renal disease.  There has been no signs of renal recovery.  He has been clipped to the outpatient dialysis center of [facility].  He will continue on a TTS schedule.  He had a successful dialysis treatment 01/20/2020 with 2.5 L removed his next dialysis treatment 01/22/2020  Blood pressure 148/65 pulse 56 temperature 98 O2 sats 90% room air  Labs: Sodium 138 potassium 4.7 chloride 106 CO2 21 BUN 41 creatinine 3.97 glucose 124 calcium 8 hemoglobin 8.5  Amiodarone 200 mg daily, amlodipine 5 mg daily, aspirin 81 mg daily, atorvastatin 10 mg daily, Eliquis 5 mg twice daily, vitamin D3 4000 units daily, Aranesp 60 mcg every Thursday  , insulin sliding scale, insulin Lantus 8 units daily, mycophenolate 180 mg twice daily, Protonix 40 mg daily, prednisone 5 mg daily, Zoloft 50 mg daily, tacrolimus 2 mg twice daily, Flomax 0.4 mg daily.  Creon 12,000 units 3 times daily   Objective:  Vital signs in last 24 hours:  Temp:  [97.8 F (36.6 C)-98.9 F (37.2 C)] 98.9 F (37.2 C) (09/03 0514) Pulse Rate:  [46-61] 56 (09/03 0514) Resp:  [16-18] 16 (09/03 0514) BP: (109-148)/(53-68) 148/65 (09/03 0514) SpO2:  [98 %-100 %] 100 % (09/03 0514) Weight:  [69 kg] 69 kg (09/02 1327)  Weight change:  Filed Weights   01/18/20 2242 01/20/20 1327  Weight: 69.3 kg 69 kg    Intake/Output: I/O last 3 completed shifts: In: 620 [P.O.:620] Out: 2975 [Urine:475; Other:2500]    Intake/Output this shift:  No intake/output data recorded.  General: Not in distress, comfortable Heart:RRR, s1s2 nl, no rub Lungs: Clear b/l, no crackle Abdomen:soft, Non-tender, non-distended Extremities: Bilateral BKA, right BKA site has bandage applied. Dialysis Access: Right AV fistula has good thrill and bruit   Basic Metabolic Panel: Recent Labs  Lab 01/14/20 1115 01/14/20 1115 01/15/20 1436 01/15/20 1436 01/16/20 0538 01/18/20 0553 01/20/20 1400  NA 137  --  136  --  141 136 138  K 4.2  --  4.8  --  4.5 5.1 4.7  CL 103  --  104  --  105 103 106  CO2 27  --  25  --  28 23 21*  GLUCOSE 195*  --  209*  --  255* 169* 124*  BUN 17  --  36*  --  19 47* 41*  CREATININE 2.31*  --  3.59*  --  2.22* 4.14* 3.97*  CALCIUM 7.8*   < > 7.9*   < > 8.0* 8.4* 8.0*  PHOS 2.7  --  3.7  --  2.9 4.3 4.2   < > = values in this interval not displayed.    Liver Function Tests: Recent Labs  Lab 01/14/20 1115 01/15/20 1436 01/16/20 0538 01/18/20 0553 01/20/20 1400  ALBUMIN 1.8* 1.7* 1.8* 2.1* 2.0*   No results for input(s): LIPASE, AMYLASE in the last 168 hours. No results for input(s): AMMONIA in the last 168 hours.  CBC: Recent Labs  Lab 01/15/20 0030 01/16/20 0547 01/17/20 0057 01/18/20 0553 01/20/20 1400  WBC 9.7 9.0 9.5 9.5 7.6  HGB 8.3* 8.9* 8.2* 9.9* 8.5*  HCT 25.9* 29.0* 26.8* 31.8* 27.0*  MCV 91.5 94.5 94.0 92.4 96.1  PLT 124* 122* 116* 144* 123*    Cardiac Enzymes: No results for input(s): CKTOTAL, CKMB, CKMBINDEX, TROPONINI in the last 168 hours.  BNP: Invalid input(s): POCBNP  CBG: Recent Labs  Lab 01/20/20 0806 01/20/20 1134 01/20/20 1831 01/20/20 2055 01/21/20 0618  GLUCAP 163* 113* 85 161* 87    Microbiology: Results for orders placed or performed during the hospital encounter of 12/30/19  SARS Coronavirus 2 by RT PCR (hospital order, performed in [facility] hospital lab) Nasopharyngeal Nasopharyngeal Swab     Status: None   Collection  Time: 12/31/19 11:15 AM   Specimen: Nasopharyngeal Swab  Result Value Ref Range Status   SARS Coronavirus 2 NEGATIVE NEGATIVE Final    Comment: (NOTE) SARS-CoV-2 target nucleic acids are NOT DETECTED.  The SARS-CoV-2 RNA is generally detectable in upper and lower respiratory specimens during the acute phase of infection. The lowest concentration of SARS-CoV-2 viral copies this assay can detect is 250 copies / mL. A negative result does not preclude SARS-CoV-2 infection and should not be used as the sole basis for treatment or other patient management decisions.  A negative result may occur with improper specimen collection / handling, submission of specimen other than nasopharyngeal swab, presence of viral mutation(s) within the areas targeted by this assay, and inadequate number of viral copies (<250 copies / mL). A negative result must be combined with clinical observations, patient history, and epidemiological information.  Fact Sheet for Patients:   StrictlyIdeas.no  Fact Sheet for Healthcare Providers: BankingDealers.co.za  This test is not yet approved or  cleared by the [place] FDA and has been authorized for detection and/or diagnosis of SARS-CoV-2 by FDA under an Emergency Use Authorization (EUA).  This EUA will remain in effect (meaning this test can be used) for the duration of the COVID-19 declaration under Section 564(b)(1) of the Act, 21 U.S.C. section 360bbb-3(b)(1), unless the authorization is terminated or revoked sooner.  Performed at [facility], [place] [address] [place], [place] [address]   Surgical PCR screen     Status: Abnormal   Collection Time: 01/11/20 10:38 PM   Specimen: Nasal Mucosa; Nasal Swab  Result Value Ref Range Status   MRSA, PCR NEGATIVE NEGATIVE Final   Staphylococcus aureus POSITIVE (A) NEGATIVE Final    Comment: (NOTE) The Xpert SA Assay (FDA approved for NASAL specimens in  patients 40 years of age and older), is one component of a comprehensive surveillance program. It is not intended to diagnose infection nor to guide or monitor treatment. Performed at [facility], [place] [address], [place], [place] [address]     Coagulation Studies: No results for input(s): LABPROT, INR in the last 72 hours.  Urinalysis: No results for input(s): COLORURINE, LABSPEC, PHURINE, GLUCOSEU, HGBUR, BILIRUBINUR, KETONESUR, PROTEINUR, UROBILINOGEN, NITRITE, LEUKOCYTESUR in the last 72 hours.  Invalid input(s): APPERANCEUR    Imaging: No results found.   Medications:    . amiodarone  200 mg Oral Daily  . amLODipine  5 mg Oral Daily  . apixaban  5 mg Oral BID  . aspirin EC  81 mg Oral Daily  . atorvastatin  10 mg Oral Q M,W,F  . bisacodyl  10 mg Oral Daily  . Chlorhexidine Gluconate Cloth  6 each Topical Q0600  . cholecalciferol  4,000 Units Oral Daily  .  darbepoetin (ARANESP) injection - DIALYSIS  60 mcg Intravenous Q Thu-HD  . feeding supplement (NEPRO CARB STEADY)  237 mL Oral TID WC  . insulin aspart  0-9 Units Subcutaneous TID WC  . insulin glargine  8 Units Subcutaneous Daily  . lipase/protease/amylase  12,000 Units Oral TID AC  . magnesium chloride  2 tablet Oral BID  . mycophenolate  180 mg Oral BID  . pantoprazole  40 mg Oral Daily  . polyethylene glycol  17 g Oral Daily  . predniSONE  5 mg Oral Q breakfast  . sertraline  50 mg Oral Daily  . tacrolimus  2 mg Oral BID  . tamsulosin  0.4 mg Oral QPC supper   acetaminophen **OR** acetaminophen, docusate sodium, oxyCODONE  Assessment/ Plan:  1. Acute kidney injury on CKD4, now progressed to ESRD: BL cr 2.8-3.4 s/p DDRT with DGF complicated by ACR and antibody mediated rejection requiring IVIG as recently as 2018.  Started dialysis 01/04/2020   No sign of renal recovery therefore we will continue TTS schedule for dialysis.  He has outpatient dialysis set up for TTS at [facility]. Patient has  functional dialysis access.  Status post dialysis 01/20/2020 with 2.5 L removed we will continue dialysis 01/22/2019  2. Kidney transplant: Continue tacrolimus, mycophenolate, prednisone-tac tapered to 2/2 on 8/18->mycophenolate tapered to 180 mg twice daily on 8/22.  Pharmacy ordering tacrolimus level since patient is on amiodarone, follow-up.  Last tacrolimus level was less than 1 12/31/2019  3. Hypertension/volume: Continue amlodipine.  Managing volume with dialysis.  4. Secondary hyperparathyroidism/hyperphosphatemia: PTH level 43.  Phosphorus level improving, off binders, monitor lab.  5. Anemia of CKD, acute blood loss anemia: Received blood transfusion. Continue Aranesp  Iron saturation 81 Monitor hemoglobin and transfuse as needed.  6. Severe sepsis due to right leg osteomyelitis/abscess on BKA stump site: Treated with antibiotics.  Status post revision of right below-knee amputation by Dr. Sharol Given on 8/25.    Transfered to CIR   LOS: [place] @TODAY @8 :05 AM

## 2020-01-21 NOTE — IPOC Note (Signed)
Overall Plan of Care [facility]) Patient Details Name: Paul [name] MRN: [id number] DOB: September 08, 1955  Admitting Diagnosis: <principal problem not specified>  Hospital Problems: Active Problems:   Right below-knee amputee [facility])     Functional Problem List: Nursing Bowel, Bladder, Edema, Medication Management, Pain, Safety, Sensory, Skin Integrity, Nutrition  PT Balance, Endurance, Motor, Pain, Skin Integrity  OT Balance, Endurance, Pain  SLP    TR         Basic ADL's: OT Grooming, Bathing, Dressing, Toileting     Advanced  ADL's: OT Simple Meal Preparation, Light Housekeeping     Transfers: PT Bed Mobility, Bed to Chair, Car, Patent attorney, Agricultural engineer: PT Ambulation, Emergency planning/management officer, Stairs     Additional Impairments: OT None  SLP        TR      Anticipated Outcomes Item Anticipated Outcome  Self Feeding independentr  Swallowing      Basic self-care  supervision  Toileting  supervision   Bathroom Transfers supervision  Bowel/Bladder  patient will be able to have elimination needs met  Transfers  grossly SPV at wc level  Locomotion  grossly SPV at wc level  Communication     Cognition     Pain  pain will be maintained to an acceptable level  Safety/Judgment  patient will have no falls with injury while on CIR   Therapy Plan: PT Intensity: Minimum of 1-2 x/day ,45 to 90 minutes PT Frequency: 5 out of 7 days PT Duration Estimated Length of Stay: 7-10 days OT Intensity: Minimum of 1-2 x/day, 45 to 90 minutes OT Frequency: 5 out of 7 days OT Duration/Estimated Length of Stay: 7-10 days     Due to the current state of emergency, patients may not be receiving their 3-hours of Medicare-mandated therapy.   Team Interventions: Nursing Interventions Patient/Family Education, Bladder Management, Bowel Management, Disease Management/Prevention, Skin Care/Wound Management, Medication Management, Pain Management, Discharge Planning,  Psychosocial Support  PT interventions Ambulation/gait training, DME/adaptive equipment instruction, UE/LE Strength taining/ROM, Psychosocial support, Balance/vestibular training, Functional electrical stimulation, Skin care/wound management, UE/LE Coordination activities, Functional mobility training, Splinting/orthotics, Community reintegration, Neuromuscular re-education, IT trainer, Wheelchair propulsion/positioning, Discharge planning, Pain management, Therapeutic Activities, Disease management/prevention, Barrister's clerk education, Therapeutic Exercise  OT Interventions Training and development officer, Disease mangement/prevention, Engineer, drilling, Academic librarian, Neuromuscular re-education, UE/LE Strength taining/ROM, Wheelchair propulsion/positioning, Therapeutic Exercise, Patient/family education, Discharge planning, Pain management, Self Care/advanced ADL retraining, Therapeutic Activities, UE/LE Coordination activities  SLP Interventions    TR Interventions    SW/CM Interventions Discharge Planning, Psychosocial Support, Patient/Family Education   Barriers to Discharge MD  Medical stability, Wound care and Hemodialysis  Nursing      PT Home environment access/layout, Hemodialysis, Weight bearing restrictions, Other (comments) (status with B BKA) limited physical ability of wife to assist patient as needed  OT      SLP      SW       Team Discharge Planning: Destination: PT-Home ,OT- Home , SLP-  Projected Follow-up: PT-Outpatient PT, Home health PT, OT-  Home health OT, SLP-  Projected Equipment Needs: PT-None recommended by PT (patient has all DME), OT- To be determined, SLP-  Equipment Details: PT- , OT-  Patient/family involved in discharge planning: PT- Patient,  OT-Patient, SLP-   MD ELOS: 7-10d Medical Rehab Prognosis:  Excellent Assessment:  64 year old right-handed male with history of renal transplant 2016with baseline creatinine 2.8-3.4  maintained on chronic prednisone, Prograf as well as Myfortic, hypertension, insulin-dependent  diabetes mellitus, chronic atrial fibrillation onEliquis, bilateral below-knee amputations receiving inpatient rehab services 08/03/2013 to 08/15/2013 as well as CIR 05/31/2013 to 06/11/2013 for multifactorial gait disorder related to peripheral neuropathy.Patient lives with spouse. Two-level home bed and bath main level with ramped entrance. Independent with assistive device and prosthesisprovided by [employer]. Presented 12/30/2019 with nonspecific pain and rash at the end of right leg stump 3 days prior. Admission chemistries glucose 269 BUN 80 creatinine 4.76, hemoglobin 10.4 WBC 13,300, lactic acid 0.5, SARS coronavirus negative, urinalysis negative nitrite, CK 56. CT of right lower extremity showed swelling edema at the distal stump with possible cutaneous blister. There was more focal hyperdense collection at the posterior lateral aspect of the distal stump measuring approximately 2.6 x 1.7 x 2.6 cm possibly representing hematoma. MRI of right lower extremity showed no findings of osteomyelitis no drainable abscess. Renal ultrasound showed no abscess or hydronephrosis. Placed on vancomycin and cefepime for questionable early cellulitis. Hospital course bouts of A. fib RVR 120s to 130 blood pressures 90s/60s received fluid bolus follow-up cardiology services and placed on IV amiodarone transition to p.o. as well as digoxin. Follow-up renal services for progressive CKD stage IV necessitating need for dialysis initiated 01/04/2020 and arrangements have been made for ongoing outpatient dialysis on discharge with latest creatinine 4.14. Follow-up Dr. Sharol Given for cellulitis suspect osteomyelitis right below-knee amputation had no change with conservative care and underwent revision right BKA 01/12/2020 and initial placement of wound VAC that was later discontinued. Patient remains on Eliquis as prior to  admission. Acute on chronic anemia 9.9 and monitored. Therapy evaluations completed and patient was admitted for a comprehensive rehab program.   Now requiring 24/7 Rehab RN,MD, as well as CIR level PT, OT and SLP.  Treatment team will focus on ADLs and mobility with goals set at Sup See Team Conference Notes for weekly updates to the plan of care

## 2020-01-21 NOTE — Progress Notes (Signed)
Physical Therapy Session Note  Patient Details  Name: Paul [name] MRN: [id number] Date of Birth: Nov 24, 1955  Today's Date: 01/21/2020 PT Individual Time: 9449-6759 and 1306-1410 and 1638-4665 PT Individual Time Calculation (min): 23 min  And 64 min and 60 min  Short Term Goals: Week 1:  PT Short Term Goal 1 (Week 1): STG=LTG based on ELOS  Skilled Therapeutic Interventions/Progress Updates:    Session 1 [phone]): Pt received sitting in w/c and agreeable to therapy session. Pt is wearing L LE shrinker and reports he has had it on since donning it during yesterday's PT session - educated pt on wearing shrinker during the day and taking it off at night for skin integrity. Pt agreeable to attempt donning his LLE prosthetic in later PT sessions. B UE w/c propulsion ~246ft to ortho gym with supervision - demos improving propulsion technique with decreased friction on bar requiring fewer push strokes to achieve this distance - demos good w/c management through doorways. Pt able to manage w/c brakes throughout session without assist or cuing.  Therapist unable to provide pt with w/c gloves at this time due to only small size available. B UE strengthening and cardiovascular training using UBE against level 3 resistance for 84minutes forward and 2 minutes backwards with pt reporting muscle fatigue/burn with this exercise. B UE w/c propulsion ~246ft back towards room with supervision and pt continuing to demo improved propulsion technique - due to UE fatigue required dependent assist remainder of distance back. Left seated in w/c with needs in reach and seat belt alarm on.  Session 2 (1306-1410): Pt received sitting in w/c and agreeable to therapy session. Still wearing L LE shrinker, doffed and noticed on distal lateral side of residual limb pt has approximately a quarter sized soft tissue knot (doesn't appear to be a blister and is definitely soft tissue, not bone) - pt unable to recall how long this has  been present but states it has NOT been there for a very long time. Pt agreeable to attempt donning L LE prosthetic, completed with assist for approximation into socket - pt reports improved fit today compared to yesterday and denies any discomfort. B UE w/c propulsion ~14ft to main therapy gym with supervision. Attempted sit<>stands in // bars with pt finally able to come to stand on 5th trial with max assist and therapist controlling L knee position while facilitating hip/knee extension - able to come to full stand 3x and on 3rd try able to sustain standing ~20seconds otherwise returned to sitting after ~5seconds due to fear of falling and fatigue. Therapist provided patient with printout on residual limb wrapping and demonstrated how to properly wrap R LE - to avoid irritation to R LE wound site had pt practice limb wrapping on L LE and with 5x trials able to complete with min cuing - will need to continue practicing to ensure carryover. Started discussing home set-up and need for measurements from wife to ensure pt can perform slide board level transfers. B UE w/c propulsion ~176ft back to room with supervision. Pt left seated in w/c with needs in reach and seat belt alarm on.   Session 3 [phone]): Therapist notified [name], PA of the 47mmx3mm soft tissue mass on lateral aspect of pt's L residual limb - PA present to assess. Pt received sitting in w/c with his wife present and pt agreeable to therapy session. Pt again unable to recall how long the soft tissue place has been on his L LE and reports no  real "pain" just "sensitivity" to the spot. Pt excited to tell his wife that he was able to stand in // bars during earlier therapy session. Therapist discussed with pt/wife regarding need for home measurements including (car set height, bed height, height of couch/recliner - home measurement sheet provided) as well as pt's CLOF of slide board transfers requiring more even level transfers. Discussed recommendation  of follow-up OPPT with pt reporting he takes [facility] transport to/from dialysis and can use that to get to therapy sessions. Discussed plan for pt's wife to attend hands-on education/training prior to pt's discharge - wife in agreement. Transported to/from gym in w/c for time management and energy conservation. R lateral scoot transfer w/c>EOM using transfer board - pt able to set-up w/c with min cuing on positioning - max cuing for placement of board and how to perform lateral trunk leans to improve board placement under pelvis (min cuing to ensure shorts covering skin to decrease sheering forces) - with improved board placement pt able to complete transfer with close supervision - pt reports increased ease of transfer. Sit<>supine on mat table with supervision and increased time for trunk control. Supine bridging with bilateral thighs on bolster 2x20 reps and L sideling R LE hip abduction wearing 3lb weight 2x15reps with manual facilitation for proper alignment throughout. L lateral scoot EOM>w/c using transfer board again with max cuing for proper board placement and then able to perform transfer with close supervision. Transported back to room in w/c and left seated with needs in reach and seat belt alarm on.   Therapist spoke with [employer] to discuss the possible adjustment needed to L prosthetic socket due to the soft tissue mass and discussed need for realignment of pylon to allow improved unilateral stance due to current R LE NWBing precautions.   Therapy Documentation Precautions:  Precautions Precautions: Fall Precaution Comments: B BKA, no pillow under R knee Restrictions Weight Bearing Restrictions: No RLE Weight Bearing: Non weight bearing  Pain: Session 1: No reports of pain throughout session only muscle burn from exercise.  Session 2: No reports of pain throughout session.  Session 3: Reports 1/10 pain in R LE after exercises and otherwise just states he feels muscle soreness from  today's therapy sessions.   Therapy/Group: Individual Therapy  Tawana Scale , PT, DPT, CSRS  01/21/2020, 8:02 AM

## 2020-01-22 LAB — CBC
HCT: 29.3 % — ABNORMAL LOW (ref 39.0–52.0)
Hemoglobin: 8.8 g/dL — ABNORMAL LOW (ref 13.0–17.0)
MCH: 28.9 pg (ref 26.0–34.0)
MCHC: 30 g/dL (ref 30.0–36.0)
MCV: 96.4 fL (ref 80.0–100.0)
Platelets: 127 10*3/uL — ABNORMAL LOW (ref 150–400)
RBC: 3.04 MIL/uL — ABNORMAL LOW (ref 4.22–5.81)
RDW: 15.5 % (ref 11.5–15.5)
WBC: 7.4 10*3/uL (ref 4.0–10.5)
nRBC: 0 % (ref 0.0–0.2)

## 2020-01-22 LAB — RENAL FUNCTION PANEL
Albumin: 2.2 g/dL — ABNORMAL LOW (ref 3.5–5.0)
Anion gap: 11 (ref 5–15)
BUN: 45 mg/dL — ABNORMAL HIGH (ref 8–23)
CO2: 22 mmol/L (ref 22–32)
Calcium: 8 mg/dL — ABNORMAL LOW (ref 8.9–10.3)
Chloride: 104 mmol/L (ref 98–111)
Creatinine, Ser: 3.97 mg/dL — ABNORMAL HIGH (ref 0.61–1.24)
GFR calc Af Amer: 17 mL/min — ABNORMAL LOW (ref >60)
GFR calc non Af Amer: 15 mL/min — ABNORMAL LOW (ref >60)
Glucose, Bld: 162 mg/dL — ABNORMAL HIGH (ref 70–99)
Phosphorus: 4.6 mg/dL (ref 2.5–4.6)
Potassium: 4.5 mmol/L (ref 3.5–5.1)
Sodium: 137 mmol/L (ref 135–145)

## 2020-01-22 LAB — GLUCOSE, CAPILLARY
Glucose-Capillary: 109 mg/dL — ABNORMAL HIGH (ref 70–99)
Glucose-Capillary: 182 mg/dL — ABNORMAL HIGH (ref 70–99)
Glucose-Capillary: 104 mg/dL — ABNORMAL HIGH (ref 70–99)
Glucose-Capillary: 170 mg/dL — ABNORMAL HIGH (ref 70–99)

## 2020-01-22 MED ORDER — ONDANSETRON HCL 4 MG PO TABS
4.0000 mg | ORAL_TABLET | Freq: Four times a day (QID) | ORAL | Status: DC | PRN
  Administered 2020-01-22 – 2020-01-30 (×2): 4 mg via ORAL
  Filled 2020-01-22 (×2): qty 1

## 2020-01-22 NOTE — Progress Notes (Signed)
[facility] ROUNDING NOTE   Subjective:   Brief narrative: 64 year old gentleman with a history of renal transplant 2016 CKD stage IV, hypertension insulin-dependent diabetes atrial fibrillation status post bilateral BKA secondary to PVD with new onset right BKA stump pain.  Patient was admitted 12/30/2019.  Underwent stump revision for right stump osteomyelitis and abscess 01/12/2020  Baseline creatinine 2.8 to 3.4 mg/dL.  Started dialysis during admission for progression of his renal disease to end-stage renal disease.  There has been no signs of renal recovery.  He has been clipped to the outpatient dialysis center of [facility].  He will continue on a TTS schedule.  He had a successful dialysis treatment 01/20/2020 with 2.5 L removed his next dialysis treatment 01/22/2020  Blood pressure 142/61 pulse 57 temperature 97.8 O2 sats 99% room air  Sodium 138 potassium 4.7 chloride 106 CO2 21 BUN 41 creatinine 3.97 glucose 124 calcium 8.0 phosphorus 4.2 albumin of 2.0 hemoglobin 8.5  Amiodarone 200 mg daily, amlodipine 5 mg daily, aspirin 81 mg daily, atorvastatin 10 mg daily, Eliquis 5 mg twice daily, vitamin D3 4000 units daily, Aranesp 60 mcg every Thursday  , insulin sliding scale, insulin Lantus 8 units daily, mycophenolate 180 mg twice daily, Protonix 40 mg daily, prednisone 5 mg daily, Zoloft 50 mg daily, tacrolimus 2 mg twice daily, Flomax 0.4 mg daily.  Creon 12,000 units 3 times daily   Objective:  Vital signs in last 24 hours:  Temp:  [97.8 F (36.6 C)-99.3 F (37.4 C)] 97.8 F (36.6 C) (09/04 0528) Pulse Rate:  [57-59] 57 (09/04 0528) Resp:  [16-17] 16 (09/04 0528) BP: (133-152)/(60-62) 152/61 (09/04 0528) SpO2:  [99 %-100 %] 99 % (09/04 0528)  Weight change:  Filed Weights   01/18/20 2242 01/20/20 1327  Weight: 69.3 kg 69 kg    Intake/Output: I/O last 3 completed shifts: In: 1200 [P.O.:1200] Out: 475 [Urine:475]   Intake/Output this shift:  No  intake/output data recorded.  General: Not in distress, comfortable Heart:RRR, s1s2 nl, no rub Lungs: Clear b/l, no crackle Abdomen:soft, Non-tender, non-distended Extremities: Bilateral BKA, right BKA site has bandage applied. Dialysis Access: Right AV fistula has good thrill and bruit   Basic Metabolic Panel: Recent Labs  Lab 01/15/20 1436 01/15/20 1436 01/16/20 0538 01/18/20 0553 01/20/20 1400  NA 136  --  141 136 138  K 4.8  --  4.5 5.1 4.7  CL 104  --  105 103 106  CO2 25  --  28 23 21*  GLUCOSE 209*  --  255* 169* 124*  BUN 36*  --  19 47* 41*  CREATININE 3.59*  --  2.22* 4.14* 3.97*  CALCIUM 7.9*   < > 8.0* 8.4* 8.0*  PHOS 3.7  --  2.9 4.3 4.2   < > = values in this interval not displayed.    Liver Function Tests: Recent Labs  Lab 01/15/20 1436 01/16/20 0538 01/18/20 0553 01/20/20 1400  ALBUMIN 1.7* 1.8* 2.1* 2.0*   No results for input(s): LIPASE, AMYLASE in the last 168 hours. No results for input(s): AMMONIA in the last 168 hours.  CBC: Recent Labs  Lab 01/16/20 0547 01/17/20 0057 01/18/20 0553 01/20/20 1400  WBC 9.0 9.5 9.5 7.6  HGB 8.9* 8.2* 9.9* 8.5*  HCT 29.0* 26.8* 31.8* 27.0*  MCV 94.5 94.0 92.4 96.1  PLT 122* 116* 144* 123*    Cardiac Enzymes: No results for input(s): CKTOTAL, CKMB, CKMBINDEX, TROPONINI in the last 168 hours.  BNP: Invalid  input(s): POCBNP  CBG: Recent Labs  Lab 01/21/20 0618 01/21/20 1121 01/21/20 1703 01/21/20 2140 01/22/20 0643  GLUCAP 87 152* 111* 167* 109*    Microbiology: Results for orders placed or performed during the hospital encounter of 12/30/19  SARS Coronavirus 2 by RT PCR (hospital order, performed in [facility] hospital lab) Nasopharyngeal Nasopharyngeal Swab     Status: None   Collection Time: 12/31/19 11:15 AM   Specimen: Nasopharyngeal Swab  Result Value Ref Range Status   SARS Coronavirus 2 NEGATIVE NEGATIVE Final    Comment: (NOTE) SARS-CoV-2 target nucleic acids are NOT  DETECTED.  The SARS-CoV-2 RNA is generally detectable in upper and lower respiratory specimens during the acute phase of infection. The lowest concentration of SARS-CoV-2 viral copies this assay can detect is 250 copies / mL. A negative result does not preclude SARS-CoV-2 infection and should not be used as the sole basis for treatment or other patient management decisions.  A negative result may occur with improper specimen collection / handling, submission of specimen other than nasopharyngeal swab, presence of viral mutation(s) within the areas targeted by this assay, and inadequate number of viral copies (<250 copies / mL). A negative result must be combined with clinical observations, patient history, and epidemiological information.  Fact Sheet for Patients:   StrictlyIdeas.no  Fact Sheet for Healthcare Providers: BankingDealers.co.za  This test is not yet approved or  cleared by the [place] FDA and has been authorized for detection and/or diagnosis of SARS-CoV-2 by FDA under an Emergency Use Authorization (EUA).  This EUA will remain in effect (meaning this test can be used) for the duration of the COVID-19 declaration under Section 564(b)(1) of the Act, 21 U.S.C. section 360bbb-3(b)(1), unless the authorization is terminated or revoked sooner.  Performed at [facility], [place] [address] [place], [place] [address]   Surgical PCR screen     Status: Abnormal   Collection Time: 01/11/20 10:38 PM   Specimen: Nasal Mucosa; Nasal Swab  Result Value Ref Range Status   MRSA, PCR NEGATIVE NEGATIVE Final   Staphylococcus aureus POSITIVE (A) NEGATIVE Final    Comment: (NOTE) The Xpert SA Assay (FDA approved for NASAL specimens in patients 56 years of age and older), is one component of a comprehensive surveillance program. It is not intended to diagnose infection nor to guide or monitor treatment. Performed at [facility], [place] [address], [place], [place] [address]     Coagulation Studies: No results for input(s): LABPROT, INR in the last 72 hours.  Urinalysis: No results for input(s): COLORURINE, LABSPEC, PHURINE, GLUCOSEU, HGBUR, BILIRUBINUR, KETONESUR, PROTEINUR, UROBILINOGEN, NITRITE, LEUKOCYTESUR in the last 72 hours.  Invalid input(s): APPERANCEUR    Imaging: No results found.   Medications:    . amiodarone  200 mg Oral Daily  . amLODipine  5 mg Oral Daily  . apixaban  5 mg Oral BID  . aspirin EC  81 mg Oral Daily  . atorvastatin  10 mg Oral Q M,W,F  . bisacodyl  10 mg Oral Daily  . Chlorhexidine Gluconate Cloth  6 each Topical Q0600  . Chlorhexidine Gluconate Cloth  6 each Topical Q0600  . cholecalciferol  4,000 Units Oral Daily  . darbepoetin (ARANESP) injection - DIALYSIS  60 mcg Intravenous Q Thu-HD  . feeding supplement (NEPRO CARB STEADY)  237 mL Oral TID WC  . insulin aspart  0-9 Units Subcutaneous TID WC  . insulin glargine  8 Units Subcutaneous Daily  .  lipase/protease/amylase  12,000 Units Oral TID AC  . magnesium chloride  2 tablet Oral BID  . mycophenolate  180 mg Oral BID  . pantoprazole  40 mg Oral Daily  . polyethylene glycol  17 g Oral Daily  . predniSONE  5 mg Oral Q breakfast  . sertraline  50 mg Oral Daily  . tacrolimus  2 mg Oral BID  . tamsulosin  0.4 mg Oral QPC supper   acetaminophen **OR** acetaminophen, docusate sodium, oxyCODONE  Assessment/ Plan:  1. Acute kidney injury on CKD4, now progressed to ESRD: BL cr 2.8-3.4 s/p DDRT with DGF complicated by ACR and antibody mediated rejection requiring IVIG as recently as 2018.  Started dialysis 01/04/2020   No sign of renal recovery therefore we will continue TTS schedule for dialysis.  He has outpatient dialysis set up for TTS at [facility]. Patient has functional dialysis access.  Status post dialysis 01/20/2020 with 2.5 L removed we will continue dialysis 01/22/2019  2. Kidney transplant: Continue  tacrolimus, mycophenolate, prednisone-tac tapered to 2/2 on 8/18->mycophenolate tapered to 180 mg twice daily on 8/22.  Pharmacy ordering tacrolimus level since patient is on amiodarone, follow-up.  Last tacrolimus level was less than 1 12/31/2019  3. Hypertension/volume: Continue amlodipine.  Managing volume with dialysis.  4. Secondary hyperparathyroidism/hyperphosphatemia: PTH level 43.  Phosphorus level improving, off binders, monitor lab.  5. Anemia of CKD, acute blood loss anemia: Received blood transfusion. Continue Aranesp  Iron saturation 81 Monitor hemoglobin and transfuse as needed.  6. Severe sepsis due to right leg osteomyelitis/abscess on BKA stump site: Treated with antibiotics.  Status post revision of right below-knee amputation by Dr. Sharol Given on 8/25.    Transfered to CIR   LOS: [place] @TODAY @8 :25 AM

## 2020-01-22 NOTE — Progress Notes (Signed)
Pt returned from HD VSS into w/c per request for dinner

## 2020-01-22 NOTE — Progress Notes (Signed)
Slept good. Patient reports, "fatty tumor" to left lateral BKA. Unable to wear prosthesis, because of pain at "tumor". Ace wrap to Right BKA. Foam dressing to sacrum. No PRN meds needed on this shift. Patrici Ranks A

## 2020-01-22 NOTE — Progress Notes (Signed)
[place] PHYSICAL MEDICINE & REHABILITATION PROGRESS NOTE   Subjective/Complaints: Pt reports has a newish "fatty tumor"- on L BKA- hx of lipomas.   LBM 2 days ago- loose-  Can't wear prosthesis well on L BKA due to lipoma pressure.    ROS-  Pt denies SOB, abd pain, CP, N/V/C/D, and vision changes   Objective:   No results found. Recent Labs    01/20/20 1400  WBC 7.6  HGB 8.5*  HCT 27.0*  PLT 123*   Recent Labs    01/20/20 1400  NA 138  K 4.7  CL 106  CO2 21*  GLUCOSE 124*  BUN 41*  CREATININE 3.97*  CALCIUM 8.0*    Intake/Output Summary (Last 24 hours) at 01/22/2020 1352 Last data filed at 01/22/2020 1225 Gross per 24 hour  Intake 956 ml  Output 0 ml  Net 956 ml     Physical Exam: Vital Signs Blood pressure (!) 152/61, pulse (!) 57, temperature 97.8 F (36.6 C), resp. rate 16, weight 69 kg, SpO2 99 %.  General: No acute distress- sitting up in manual w/c in room, RN at bedside, NAD Mood and affect are appropriate Heart: borderline bradycardia- irregular Lungs: CTA B/L- no W/R/R- good air movement Abdomen: Soft, NT, ND, (+)BS  Extremities: No clubbing, cyanosis, or edema- thrill in RUE with dressings in place Has small to moderate sized firmish/fatty nodule/tumor on lateral aspect of L BKA near scar edge- c/w lipoma- large cherry sized Skin: No evidence of breakdown, no evidence of rash, see R BKA image below Neurologic: Cranial nerves II through XII intact, motor strength is 5/5 in bilateral deltoid, bicep, tricep, grip, hip flexor  Musculoskeletal:well healed Left BKA short residual, RIght BKA image below       Assessment/Plan: 1. Functional deficits secondary to Right BKA revision, hx Left BKA which require 3+ hours per day of interdisciplinary therapy in a comprehensive inpatient rehab setting.  Physiatrist is providing close team supervision and 24 hour management of active medical problems listed below.  Physiatrist and rehab team continue  to assess barriers to discharge/monitor patient progress toward functional and medical goals  Care Tool:  Bathing    Body parts bathed by patient: Right arm, Left arm, Chest, Abdomen, Front perineal area, Buttocks, Right upper leg, Left upper leg, Face     Body parts n/a: Right lower leg, Left lower leg   Bathing assist Assist Level: Contact Guard/Touching assist (sit to supine)     Upper Body Dressing/Undressing Upper body dressing   What is the patient wearing?: Pull over shirt    Upper body assist Assist Level: Minimal Assistance - Patient > 75%    Lower Body Dressing/Undressing Lower body dressing      What is the patient wearing?: Incontinence brief, Pants     Lower body assist Assist for lower body dressing: Contact Guard/Touching assist     Toileting Toileting    Toileting assist Assist for toileting: Moderate Assistance - Patient 50 - 74%     Transfers Chair/bed transfer  Transfers assist     Chair/bed transfer assist level: Contact Guard/Touching assist (slide board)     Locomotion Ambulation   Ambulation assist   Ambulation activity did not occur: Safety/medical concerns (status as B BKA)          Walk 10 feet activity   Assist  Walk 10 feet activity did not occur: Safety/medical concerns (status as B BKA)        Walk 50 feet activity  Assist Walk 50 feet with 2 turns activity did not occur: Safety/medical concerns (status as B BKA)         Walk 150 feet activity   Assist Walk 150 feet activity did not occur: Safety/medical concerns (status as B BKA)         Walk 10 feet on uneven surface  activity   Assist Walk 10 feet on uneven surfaces activity did not occur: Safety/medical concerns (status as B BKA)         Wheelchair     Assist Will patient use wheelchair at discharge?: Yes Type of Wheelchair: Manual    Wheelchair assist level: Supervision/Verbal cueing, Set up assist Max wheelchair distance: 120ft     Wheelchair 50 feet with 2 turns activity    Assist        Assist Level: Supervision/Verbal cueing, Set up assist   Wheelchair 150 feet activity     Assist      Assist Level: Set up assist, Supervision/Verbal cueing   Blood pressure (!) 152/61, pulse (!) 57, temperature 97.8 F (36.6 C), resp. rate 16, weight 69 kg, SpO2 99 %.    Medical Problem List and Plan: 1.  Decreased functional ability secondary to cellulitis/osteomyelitis right BKA with history of bilateral BKA.  Status post right BKA revision 01/12/2020.  Wound VAC has been discontinued             -patient may  Shower if R BKA wrapped             -ELOS/Goals: 5-9 days- mod I to supervision 2.  Antithrombotics: -DVT/anticoagulation: Eliquis             -antiplatelet therapy: Aspirin 81 mg daily 3. Pain Management: Oxycodone as needed 4. Mood: Zoloft 50 mg daily.  Provide emotional support             -antipsychotic agents: N/A 5. Neuropsych: This patient is capable of making decisions on his own behalf. 6. Skin/Wound Care: Routine skin checks 7. Fluids/Electrolytes/Nutrition: Routine in and outs with follow-up chemistries 8.  Acute kidney injury on CKD stage IV with history of kidney transplant 2016.  Long-term dialysis now initiated 01/04/2020.  Outpatient dialysis set up for Tuesday Thursday Saturday at [facility] dialysis center.  Continue low-dose chronic prednisone, Myfortic 180 mg twice daily as well as Prograf 2 mg twice daily 9.  Chronic anemia.  Follow-up CBC.  Continue Aranesp 10.  Diabetes mellitus with peripheral neuropathy (on chronic prednisone).  Hemoglobin A1c CBG (last 3)  Recent Labs    01/21/20 2140 01/22/20 0643 01/22/20 1136  GLUCAP 167* 109* 182*  9/4- BGs 109-182- overall controlled- but cont' regimen for now    NovoLog 2 units 3 times daily does not use at home will just use SSI , Lantus insulin 8 units dail may need to dial back if am CBG remains low - will cont current  regimen of Lantus 8U and SSI for now  11.  Atrial fibrillation.  Amiodarone as directed.  Continue Eliquis.  Cardiac rate controlled 12.  Hyperlipidemia.  Lipitor 13. Stage II sacral pressure ulcer- on admission- will need to con't foam dressing, keep pt off backside unless eating or doing therapy- might benefit from air mattress if won't impair transfers.   9/4- unable to assess today since in w/c- ill attempt to see tomorrow  14.  Loose stools will reduce colace to prn, held MIralax yesterday  9/4- LBM 2 days ago- need to go by tomorrow 15. L  BKA lipoma  9/4- don't know if just grew suddenly, which is abnormal for lipoma- mobile, exam c/w lipoma, however also hx of duodenum malignant tumor/needing Whipples in past- Think needs f/u with Plastics Surgery asap to get it removed.    LOS: 4 days A FACE TO FACE EVALUATION WAS PERFORMED  Paul [name] 01/22/2020, 1:52 PM

## 2020-01-22 NOTE — Progress Notes (Signed)
No void. Bladder scan=265cc's. Paul [name] A

## 2020-01-22 NOTE — Progress Notes (Signed)
Pt was medicated for pain this morning. Denies any pain at this time. Pt's dressing C/D/I would prefer that dressing change be done after dialysis. Pt taken to dialysis

## 2020-01-23 ENCOUNTER — Inpatient Hospital Stay (HOSPITAL_COMMUNITY): Payer: Managed Care, Other (non HMO) | Admitting: Physical Therapy

## 2020-01-23 ENCOUNTER — Inpatient Hospital Stay (HOSPITAL_COMMUNITY): Payer: Managed Care, Other (non HMO) | Admitting: Occupational Therapy

## 2020-01-23 ENCOUNTER — Encounter (HOSPITAL_COMMUNITY): Payer: Managed Care, Other (non HMO) | Admitting: Occupational Therapy

## 2020-01-23 LAB — GLUCOSE, CAPILLARY
Glucose-Capillary: 134 mg/dL — ABNORMAL HIGH (ref 70–99)
Glucose-Capillary: 202 mg/dL — ABNORMAL HIGH (ref 70–99)
Glucose-Capillary: 133 mg/dL — ABNORMAL HIGH (ref 70–99)
Glucose-Capillary: 248 mg/dL — ABNORMAL HIGH (ref 70–99)

## 2020-01-23 LAB — TACROLIMUS LEVEL: Tacrolimus (FK506) - LabCorp: 3.9 ng/mL (ref 2.0–20.0)

## 2020-01-23 MED ORDER — SORBITOL 70 % SOLN
30.0000 mL | Freq: Once | Status: AC
  Administered 2020-01-23: 30 mL via ORAL
  Filled 2020-01-23: qty 30

## 2020-01-23 NOTE — Progress Notes (Signed)
[place] PHYSICAL MEDICINE & REHABILITATION PROGRESS NOTE   Subjective/Complaints:   Pt reports had episode of N/V last night- per nurse note, was partially digested food- pt said initially needs to have BM, but not after vomiting episode.   LBM 2+ days ago- will order Sorbitol and enema prn.   No more nausea or feeling bad.  HD went well last evening.   Did take miralax this AM ROS-    Pt denies SOB, abd pain, CP, N/V/C/D, and vision changes    Objective:   No results found. Recent Labs    01/20/20 1400 01/22/20 1503  WBC 7.6 7.4  HGB 8.5* 8.8*  HCT 27.0* 29.3*  PLT 123* 127*   Recent Labs    01/20/20 1400 01/22/20 1504  NA 138 137  K 4.7 4.5  CL 106 104  CO2 21* 22  GLUCOSE 124* 162*  BUN 41* 45*  CREATININE 3.97* 3.97*  CALCIUM 8.0* 8.0*    Intake/Output Summary (Last 24 hours) at 01/23/2020 1304 Last data filed at 01/22/2020 2100 Gross per 24 hour  Intake 360 ml  Output 2000 ml  Net -1640 ml     Physical Exam: Vital Signs Blood pressure (!) 150/60, pulse (!) 59, temperature 98.4 F (36.9 C), temperature source Oral, resp. rate 18, weight 66.8 kg, SpO2 99 %.  General: sitting up in bedside chair; appropriate, NAD Mood and affect are appropriate Heart: borderline bradycardia- irregular rhythm- but sounds "close" to regular Lungs: CTA B/L- no W/R/R- good air movement Abdomen: Soft, NT, ND, (+)BS - no TTP; no rebound Extremities: No clubbing, cyanosis, or edema- thrill in RUE with dressings in place Has small to moderate sized firmish/fatty nodule/tumor on lateral aspect of L BKA near scar edge- c/w lipoma- large cherry sized Skin: No evidence of breakdown, no evidence of rash, see R BKA image below Neurologic: Cranial nerves II through XII intact, motor strength is 5/5 in bilateral deltoid, bicep, tricep, grip, hip flexor  Musculoskeletal:well healed Left BKA short residual, RIght BKA image below       Assessment/Plan: 1. Functional  deficits secondary to Right BKA revision, hx Left BKA which require 3+ hours per day of interdisciplinary therapy in a comprehensive inpatient rehab setting.  Physiatrist is providing close team supervision and 24 hour management of active medical problems listed below.  Physiatrist and rehab team continue to assess barriers to discharge/monitor patient progress toward functional and medical goals  Care Tool:  Bathing    Body parts bathed by patient: Right arm, Left arm, Chest, Abdomen, Front perineal area, Buttocks, Right upper leg, Left upper leg, Face     Body parts n/a: Right lower leg, Left lower leg   Bathing assist Assist Level: Contact Guard/Touching assist (sit to supine)     Upper Body Dressing/Undressing Upper body dressing   What is the patient wearing?: Pull over shirt    Upper body assist Assist Level: Minimal Assistance - Patient > 75%    Lower Body Dressing/Undressing Lower body dressing      What is the patient wearing?: Incontinence brief, Pants     Lower body assist Assist for lower body dressing: Contact Guard/Touching assist     Toileting Toileting    Toileting assist Assist for toileting: Moderate Assistance - Patient 50 - 74%     Transfers Chair/bed transfer  Transfers assist     Chair/bed transfer assist level: Contact Guard/Touching assist (slide board)     Locomotion Ambulation   Ambulation assist  Ambulation activity did not occur: Safety/medical concerns (status as B BKA)          Walk 10 feet activity   Assist  Walk 10 feet activity did not occur: Safety/medical concerns (status as B BKA)        Walk 50 feet activity   Assist Walk 50 feet with 2 turns activity did not occur: Safety/medical concerns (status as B BKA)         Walk 150 feet activity   Assist Walk 150 feet activity did not occur: Safety/medical concerns (status as B BKA)         Walk 10 feet on uneven surface  activity   Assist Walk  10 feet on uneven surfaces activity did not occur: Safety/medical concerns (status as B BKA)         Wheelchair     Assist Will patient use wheelchair at discharge?: Yes Type of Wheelchair: Manual    Wheelchair assist level: Supervision/Verbal cueing, Set up assist Max wheelchair distance: 175ft    Wheelchair 50 feet with 2 turns activity    Assist        Assist Level: Supervision/Verbal cueing, Set up assist   Wheelchair 150 feet activity     Assist      Assist Level: Set up assist, Supervision/Verbal cueing   Blood pressure (!) 150/60, pulse (!) 59, temperature 98.4 F (36.9 C), temperature source Oral, resp. rate 18, weight 66.8 kg, SpO2 99 %.    Medical Problem List and Plan: 1.  Decreased functional ability secondary to cellulitis/osteomyelitis right BKA with history of bilateral BKA.  Status post right BKA revision 01/12/2020.  Wound VAC has been discontinued             -patient may  Shower if R BKA wrapped             -ELOS/Goals: 5-9 days- mod I to supervision 2.  Antithrombotics: -DVT/anticoagulation: Eliquis             -antiplatelet therapy: Aspirin 81 mg daily 3. Pain Management: Oxycodone as needed 4. Mood: Zoloft 50 mg daily.  Provide emotional support             -antipsychotic agents: N/A 5. Neuropsych: This patient is capable of making decisions on his own behalf. 6. Skin/Wound Care: Routine skin checks 7. Fluids/Electrolytes/Nutrition: Routine in and outs with follow-up chemistries 8.  Acute kidney injury on CKD stage IV with history of kidney transplant 2016.  Long-term dialysis now initiated 01/04/2020.  Outpatient dialysis set up for Tuesday Thursday Saturday at [facility] dialysis center.  Continue low-dose chronic prednisone, Myfortic 180 mg twice daily as well as Prograf 2 mg twice daily 9.  Chronic anemia.  Follow-up CBC.  Continue Aranesp 10.  Diabetes mellitus with peripheral neuropathy (on chronic prednisone).  Hemoglobin  A1c CBG (last 3)  Recent Labs    01/22/20 2132 01/23/20 0647 01/23/20 1202  GLUCAP 170* 134* 202*  9/5- BGs overall controlled- con't regimen  NovoLog 2 units 3 times daily does not use at home will just use SSI , Lantus insulin 8 units dail may need to dial back if am CBG remains low - will cont current regimen of Lantus 8U and SSI for now  11.  Atrial fibrillation.  Amiodarone as directed.  Continue Eliquis.  Cardiac rate controlled  9/5- rate controlled- con't regimen 12.  Hyperlipidemia.  Lipitor 13. Stage II sacral pressure ulcer- on admission- will need to con't foam dressing, keep pt  off backside unless eating or doing therapy- might benefit from air mattress if won't impair transfers.   9/5- in bedside chair  14.  Loose stools will reduce colace to prn, held MIralax yesterday  9/4- LBM 2 days ago- need to go by tomorrow  9/5- will give sorbitol and enema if needed today- needs to have BM 15. L BKA lipoma  9/4- don't know if just grew suddenly, which is abnormal for lipoma- mobile, exam c/w lipoma, however also hx of duodenum malignant tumor/needing Whipples in past- Think needs f/u with Plastics Surgery asap to get it removed.  16. N/V x1  9.5- gave Zofran prn last night for vomiting x1- autonomic/gastroparesis (pt says it occurs sometimes)- also if due to constipation, giving sorbitol today.   LOS: 5 days A FACE TO FACE EVALUATION WAS PERFORMED  Donielle Kaigler 01/23/2020, 1:04 PM

## 2020-01-23 NOTE — Progress Notes (Signed)
Pt up in chair appetite excellent ate 100% of breakfast, denies any pain denies any nausea pt did take bowel medications this morning. Dressing changed sutures intact to Right stump small amount of old bloody drainage noted to old dressing. Pt remains up in chair working with OT

## 2020-01-23 NOTE — Progress Notes (Signed)
Physical Therapy Session Note  Patient Details  Name: Paul [name] MRN: [id number] Date of Birth: [birth year]  Today's Date: 01/23/2020 PT Individual Time: 5397-6734 PT Individual Time Calculation (min): 45 min   Short Term Goals: Week 1:  PT Short Term Goal 1 (Week 1): STG=LTG based on ELOS  Skilled Therapeutic Interventions/Progress Updates:    Pt received seated in w/c in room, agreeable to PT session. No complaints of pain. Manual w/c propulsion 2 x 150 ft with use of BUE at Supervision level. Slide board transfer w/c to mat table with CGA for balance. While seated EOM performed seated ball toss reaching outside BOS to catch ball with SBA for sitting balance. Progressed to long-sitting on mat for B HS stretch while performing ball toss, again SBA for sitting balance. Pt then transitioned to supine position at Supervision level. While in supine pt performed BLE strengthening therex: SLR, hip abd, quad set, glute set x 15 reps each. Supine to prone with Supervision. Prone hip ext and HS curls x 15 reps each. Pt returned to sitting EOM with Supervision. Reviewed residual limb positioning to prevent contracture, pt with good recall of positioning from previous BKA. Slide board transfer back to w/c with Supervision. Pt left seated in w/c in room with needs in reach at end of session.  Therapy Documentation Precautions:  Precautions Precautions: Fall Precaution Comments: B BKA, no pillow under R knee Restrictions Weight Bearing Restrictions: Yes RLE Weight Bearing: Non weight bearing    Therapy/Group: Individual Therapy   Excell Seltzer, PT, DPT  01/23/2020, 5:03 PM

## 2020-01-23 NOTE — Progress Notes (Signed)
[facility] ROUNDING NOTE   Subjective:   Brief narrative: 64 year old gentleman with a history of renal transplant 2016 CKD stage IV, hypertension insulin-dependent diabetes atrial fibrillation status post bilateral BKA secondary to PVD with new onset right BKA stump pain.  Patient was admitted 12/30/2019.  Underwent stump revision for right stump osteomyelitis and abscess 01/12/2020  Baseline creatinine 2.8 to 3.4 mg/dL.  Started dialysis during admission for progression of his renal disease to end-stage renal disease.  There has been no signs of renal recovery.  He has been clipped to the outpatient dialysis center of [facility].  He will continue on a TTS schedule.  He had a successful dialysis treatment 01/22/2020 with 2 L removed.  His next dialysis treatment be 01/25/2020  Blood pressure 150/60 pulse 59 temperature 98.4 O2 sats 99% room air  Sodium 137 potassium 4.5 chloride 104 CO2 22 BUN 45 creatinine 3.97 glucose 162 calcium 8.0 hemoglobin 8.8  Amiodarone 200 mg daily, amlodipine 5 mg daily, aspirin 81 mg daily, atorvastatin 10 mg daily, Eliquis 5 mg twice daily, vitamin D3 4000 units daily, Aranesp 60 mcg every Thursday  , insulin sliding scale, insulin Lantus 8 units daily, mycophenolate 180 mg twice daily, Protonix 40 mg daily, prednisone 5 mg daily, Zoloft 50 mg daily, tacrolimus 2 mg twice daily, Flomax 0.4 mg daily.  Creon 12,000 units 3 times daily   Objective:  Vital signs in last 24 hours:  Temp:  [98 F (36.7 C)-98.4 F (36.9 C)] 98.4 F (36.9 C) (09/05 0514) Pulse Rate:  [57-60] 59 (09/05 0514) Resp:  [16-18] 18 (09/05 0514) BP: (105-150)/(58-68) 150/60 (09/05 0514) SpO2:  [99 %-100 %] 99 % (09/05 0514) Weight:  [66.8 kg-69.2 kg] 66.8 kg (09/05 0514)  Weight change:  Filed Weights   01/22/20 1330 01/22/20 1830 01/23/20 0514  Weight: 69.2 kg 67.1 kg 66.8 kg    Intake/Output: I/O last 3 completed shifts: In: 0076 [P.O.:1316] Out: 2000  [Other:2000]   Intake/Output this shift:  No intake/output data recorded.  General: Not in distress, comfortable Heart:RRR, s1s2 nl, no rub Lungs: Clear b/l, no crackle Abdomen:soft, Non-tender, non-distended Extremities: Bilateral BKA, right BKA site has bandage applied. Dialysis Access: Right AV fistula has good thrill and bruit   Basic Metabolic Panel: Recent Labs  Lab 01/18/20 0553 01/20/20 1400 01/22/20 1504  NA 136 138 137  K 5.1 4.7 4.5  CL 103 106 104  CO2 23 21* 22  GLUCOSE 169* 124* 162*  BUN 47* 41* 45*  CREATININE 4.14* 3.97* 3.97*  CALCIUM 8.4* 8.0* 8.0*  PHOS 4.3 4.2 4.6    Liver Function Tests: Recent Labs  Lab 01/18/20 0553 01/20/20 1400 01/22/20 1504  ALBUMIN 2.1* 2.0* 2.2*   No results for input(s): LIPASE, AMYLASE in the last 168 hours. No results for input(s): AMMONIA in the last 168 hours.  CBC: Recent Labs  Lab 01/17/20 0057 01/18/20 0553 01/20/20 1400 01/22/20 1503  WBC 9.5 9.5 7.6 7.4  HGB 8.2* 9.9* 8.5* 8.8*  HCT 26.8* 31.8* 27.0* 29.3*  MCV 94.0 92.4 96.1 96.4  PLT 116* 144* 123* 127*    Cardiac Enzymes: No results for input(s): CKTOTAL, CKMB, CKMBINDEX, TROPONINI in the last 168 hours.  BNP: Invalid input(s): POCBNP  CBG: Recent Labs  Lab 01/22/20 0643 01/22/20 1136 01/22/20 1859 01/22/20 2132 01/23/20 0647  GLUCAP 109* 182* 104* 170* 134*    Microbiology: Results for orders placed or performed during the hospital encounter of 12/30/19  SARS Coronavirus 2  by RT PCR (hospital order, performed in [facility] hospital lab) Nasopharyngeal Nasopharyngeal Swab     Status: None   Collection Time: 12/31/19 11:15 AM   Specimen: Nasopharyngeal Swab  Result Value Ref Range Status   SARS Coronavirus 2 NEGATIVE NEGATIVE Final    Comment: (NOTE) SARS-CoV-2 target nucleic acids are NOT DETECTED.  The SARS-CoV-2 RNA is generally detectable in upper and lower respiratory specimens during the acute phase of infection. The  lowest concentration of SARS-CoV-2 viral copies this assay can detect is 250 copies / mL. A negative result does not preclude SARS-CoV-2 infection and should not be used as the sole basis for treatment or other patient management decisions.  A negative result may occur with improper specimen collection / handling, submission of specimen other than nasopharyngeal swab, presence of viral mutation(s) within the areas targeted by this assay, and inadequate number of viral copies (<250 copies / mL). A negative result must be combined with clinical observations, patient history, and epidemiological information.  Fact Sheet for Patients:   StrictlyIdeas.no  Fact Sheet for Healthcare Providers: BankingDealers.co.za  This test is not yet approved or  cleared by the [place] FDA and has been authorized for detection and/or diagnosis of SARS-CoV-2 by FDA under an Emergency Use Authorization (EUA).  This EUA will remain in effect (meaning this test can be used) for the duration of the COVID-19 declaration under Section 564(b)(1) of the Act, 21 U.S.C. section 360bbb-3(b)(1), unless the authorization is terminated or revoked sooner.  Performed at [facility], [place] [address], [place], [place] [address]   Surgical PCR screen     Status: Abnormal   Collection Time: 01/11/20 10:38 PM   Specimen: Nasal Mucosa; Nasal Swab  Result Value Ref Range Status   MRSA, PCR NEGATIVE NEGATIVE Final   Staphylococcus aureus POSITIVE (A) NEGATIVE Final    Comment: (NOTE) The Xpert SA Assay (FDA approved for NASAL specimens in patients 40 years of age and older), is one component of a comprehensive surveillance program. It is not intended to diagnose infection nor to guide or monitor treatment. Performed at [facility], [place] [address], [place], [place] [address]     Coagulation Studies: No results for input(s): LABPROT, INR in the last 72  hours.  Urinalysis: No results for input(s): COLORURINE, LABSPEC, PHURINE, GLUCOSEU, HGBUR, BILIRUBINUR, KETONESUR, PROTEINUR, UROBILINOGEN, NITRITE, LEUKOCYTESUR in the last 72 hours.  Invalid input(s): APPERANCEUR    Imaging: No results found.   Medications:    . amiodarone  200 mg Oral Daily  . amLODipine  5 mg Oral Daily  . apixaban  5 mg Oral BID  . aspirin EC  81 mg Oral Daily  . atorvastatin  10 mg Oral Q M,W,F  . bisacodyl  10 mg Oral Daily  . Chlorhexidine Gluconate Cloth  6 each Topical Q0600  . Chlorhexidine Gluconate Cloth  6 each Topical Q0600  . cholecalciferol  4,000 Units Oral Daily  . darbepoetin (ARANESP) injection - DIALYSIS  60 mcg Intravenous Q Thu-HD  . feeding supplement (NEPRO CARB STEADY)  237 mL Oral TID WC  . insulin aspart  0-9 Units Subcutaneous TID WC  . insulin glargine  8 Units Subcutaneous Daily  . lipase/protease/amylase  12,000 Units Oral TID AC  . magnesium chloride  2 tablet Oral BID  . mycophenolate  180 mg Oral BID  . pantoprazole  40 mg Oral Daily  . polyethylene glycol  17 g Oral Daily  . predniSONE  5 mg Oral Q breakfast  . sertraline  50 mg Oral Daily  . tacrolimus  2 mg Oral BID  . tamsulosin  0.4 mg Oral QPC supper   acetaminophen **OR** acetaminophen, docusate sodium, ondansetron, oxyCODONE  Assessment/ Plan:  1. Acute kidney injury on CKD4, now progressed to ESRD: BL cr 2.8-3.4 s/p DDRT with DGF complicated by ACR and antibody mediated rejection requiring IVIG as recently as 2018.  Started dialysis 01/04/2020   No sign of renal recovery therefore we will continue TTS schedule for dialysis.  He has outpatient dialysis set up for TTS at [facility]. Patient has functional dialysis access.  Status post dialysis 01/22/2020 with 2.0 L removed.  Next dialysis treatment 01/25/2020  2. Kidney transplant: Continue tacrolimus, mycophenolate, prednisone-tac tapered to 2/2 on 8/18->mycophenolate tapered to 180 mg twice daily on 8/22.   Pharmacy ordering tacrolimus level since patient is on amiodarone, follow-up.  Last tacrolimus level was less than 1 12/31/2019  3. Hypertension/volume: Continue amlodipine.  Managing volume with dialysis.  4. Secondary hyperparathyroidism/hyperphosphatemia: PTH level 43.  Phosphorus level improving, off binders, monitor lab.  5. Anemia of CKD, acute blood loss anemia: Received blood transfusion. Continue Aranesp  Iron saturation 81 Monitor hemoglobin and transfuse as needed.  6. Severe sepsis due to right leg osteomyelitis/abscess on BKA stump site: Treated with antibiotics.  Status post revision of right below-knee amputation by Dr. Sharol Given on 8/25.    Transfered to CIR   LOS: [place] @TODAY @7 :38 AM

## 2020-01-23 NOTE — Progress Notes (Addendum)
Occupational Therapy Session Note  Patient Details  Name: Paul [name] MRN: [id number] Date of Birth: Dec 05, 1955  Today's Date: 01/23/2020 OT Individual Time: [phone] and 5176-1607 OT Individual Time Calculation (min): 60 min  and Today's Date: 01/23/2020 and 43 min  OT Group Time: 1100-1200 OT Group Time Calculation (min): 60 min  Short Term Goals: Week 1:  OT Short Term Goal 1 (Week 1): Short term goals equal to LTGs set at overall supervision level based on ELOS.  Skilled Therapeutic Intervention group tx (60 min):    Pt engaged in therapeutic w/c level dance group focusing on patient choice, UE/LE strengthening, salience, activity tolerance, and social participation. Pt was guided through various dance-based exercises involving UEs/LEs and trunk. All music was selected by group members. Emphasis placed on UE strengthening and activity tolerance. Pt brought his spouse to group today. He exhibited high levels of participation and especially worked on extension of Rt knee, took rest breaks as needed. At end of session his spouse returned pt to the room via w/c.   Skilled Therapeutic Intervention 1:1 tx (60 min) Pt greeted in the w/c with no c/o pain. Just finished breakfast and requested to engage in bathing/dressing tasks. He had a shower chair in the bathroom, discussed option of using slideboard with drop arm BSC for shower transfer this AM with pt stating he preferred to use a TTB per his setup at home. Therefore ADLs were completed while sitting at the sink using lateral leans for LB self care. He required setup for UB bathing/dressing tasks, CGA for lateral leans when washing/dressing LB, Min A to fully elevate pull ups and pants with pt able to lean and complete small w/c push ups to assist. Vcs for locking w/c brakes at appropriate times as well. We discussed importance of daily residual limb inspection for skin integrity. He states he has a LH mirror at home already. After RN redressed  residual limb, OT rewrapped residual limb with ACE wraps and educated pt importance of wrapping above the knee joint to promote knee extension. Hair washing completed while pt was reclined at the sink using hair washing tray which appeared to brighten affect. Setup for oral care, hair brushing, and shaving. OT retrieved a TTB and materials to cover Rt LE in shower, explained covering technique and how we were going to practice TTB transfer during PM session together in prep for full shower during therapy this week. Pt verbalized understanding. He remained sitting in the w/c with all needs within reach and safety belt fastened. Tx focus placed on amputee education and adaptive self care skills.   Skilled Therapeutic Intervention 1:1 tx 2nd Session (43 min) Pt greeted in the w/c with no c/o pain. Started session by practicing slideboard transfers to the TTB in his CIR bathroom, pt setting up w/c with min cuing in prep for transfer. He was able to place the board himself and then scoot across with close supervision. Discussed wearing swim trunks in and out of shower to increase ease of shower transfers at home. While sitting in the room taught pt how to cover his residual limb for shower with cuing and demonstration. Pt able to exhibit carryover of education afterwards with hands on practice. Near end of session pt requested to review UE exercises using theraband that primary therapist had taught him. Pt able to complete x8 reps of shoulder flexion, shoulder external rotation, and shoulder abduction with min cuing. Education also provided regarding completion of 2 stretches before exercises emphasizing  extension and retraction of shoulder to offset flexed posturing promoted by w/c level activity. At end of session pt remained sitting in the w/c with all needs within reach.    Therapy Documentation Precautions:  Precautions Precautions: Fall Precaution Comments: B BKA, no pillow under R  knee Restrictions Weight Bearing Restrictions: Yes RLE Weight Bearing: Non weight bearing Pain: no s/s pain during dance group or c/o pain during 2nd session   ADL: ADL Eating: Independent Where Assessed-Eating: Bed level Grooming: Setup Where Assessed-Grooming: Wheelchair Upper Body Bathing: Supervision/safety Where Assessed-Upper Body Bathing: Edge of bed Lower Body Bathing: Minimal assistance Where Assessed-Lower Body Bathing: Edge of bed Upper Body Dressing: Setup Where Assessed-Upper Body Dressing: Edge of bed Lower Body Dressing: Minimal assistance Where Assessed-Lower Body Dressing: Edge of bed Toileting: Moderate assistance Where Assessed-Toileting: Bedside Commode Toilet Transfer: Minimal assistance Toilet Transfer Method: Environmental consultant Equipment: Drop arm bedside commode Tub/Shower Transfer: Not assessed Social research officer, government: Not assessed     Therapy/Group: Individual Therapy and Group Therapy  Yogesh Cominsky A Krislynn Gronau 01/23/2020, 12:23 PM

## 2020-01-23 NOTE — Progress Notes (Signed)
At 2000, requested to be placed on bedpan, to have BM.  incontinence Brief saturated with urine. Unable to have BM, requested PO med to assist. PRN colace given at 2035, informed med is just a stool softner. According to [facility], refused scheduled miralax and dulcolax tabs, on previous shift. BS (+) x 4 quads. He reports feeling distended, abd. Soft. At 2145, called to room, patient had vomited a  large amount of partially digested food. Reports "this happens sometimes." Unable to tell what triggers it. Paged Dr. Dagoberto Ligas, zofran ordered PRN. 1st dose given at 2205. No further C/O N & V. Foam dressing changed to sacrum. Right BKA with ace wrap in place. Paul [name] A

## 2020-01-24 ENCOUNTER — Inpatient Hospital Stay (HOSPITAL_COMMUNITY): Payer: Managed Care, Other (non HMO) | Admitting: Occupational Therapy

## 2020-01-24 ENCOUNTER — Inpatient Hospital Stay (HOSPITAL_COMMUNITY): Payer: Managed Care, Other (non HMO)

## 2020-01-24 LAB — CBC
HCT: 30.2 % — ABNORMAL LOW (ref 39.0–52.0)
Hemoglobin: 9.2 g/dL — ABNORMAL LOW (ref 13.0–17.0)
MCH: 29.3 pg (ref 26.0–34.0)
MCHC: 30.5 g/dL (ref 30.0–36.0)
MCV: 96.2 fL (ref 80.0–100.0)
Platelets: 138 10*3/uL — ABNORMAL LOW (ref 150–400)
RBC: 3.14 MIL/uL — ABNORMAL LOW (ref 4.22–5.81)
RDW: 15.7 % — ABNORMAL HIGH (ref 11.5–15.5)
WBC: 8.2 10*3/uL (ref 4.0–10.5)
nRBC: 0 % (ref 0.0–0.2)

## 2020-01-24 LAB — RENAL FUNCTION PANEL
Albumin: 2.4 g/dL — ABNORMAL LOW (ref 3.5–5.0)
Anion gap: 13 (ref 5–15)
BUN: 58 mg/dL — ABNORMAL HIGH (ref 8–23)
CO2: 23 mmol/L (ref 22–32)
Calcium: 8 mg/dL — ABNORMAL LOW (ref 8.9–10.3)
Chloride: 100 mmol/L (ref 98–111)
Creatinine, Ser: 3.88 mg/dL — ABNORMAL HIGH (ref 0.61–1.24)
GFR calc Af Amer: 18 mL/min — ABNORMAL LOW (ref >60)
GFR calc non Af Amer: 15 mL/min — ABNORMAL LOW (ref >60)
Glucose, Bld: 209 mg/dL — ABNORMAL HIGH (ref 70–99)
Phosphorus: 5.8 mg/dL — ABNORMAL HIGH (ref 2.5–4.6)
Potassium: 4.5 mmol/L (ref 3.5–5.1)
Sodium: 136 mmol/L (ref 135–145)

## 2020-01-24 LAB — GLUCOSE, CAPILLARY
Glucose-Capillary: 131 mg/dL — ABNORMAL HIGH (ref 70–99)
Glucose-Capillary: 164 mg/dL — ABNORMAL HIGH (ref 70–99)
Glucose-Capillary: 213 mg/dL — ABNORMAL HIGH (ref 70–99)
Glucose-Capillary: 140 mg/dL — ABNORMAL HIGH (ref 70–99)

## 2020-01-24 MED ORDER — LIDOCAINE-PRILOCAINE 2.5-2.5 % EX CREA
1.0000 "application " | TOPICAL_CREAM | CUTANEOUS | Status: DC | PRN
  Filled 2020-01-24: qty 5

## 2020-01-24 MED ORDER — LIDOCAINE HCL (PF) 1 % IJ SOLN
5.0000 mL | INTRAMUSCULAR | Status: DC | PRN
  Filled 2020-01-24: qty 5

## 2020-01-24 MED ORDER — SODIUM CHLORIDE 0.9 % IV SOLN: 100.0000 mL | INTRAVENOUS | Status: DC | PRN

## 2020-01-24 MED ORDER — PENTAFLUOROPROP-TETRAFLUOROETH EX AERO: 1.0000 "application " | INHALATION_SPRAY | CUTANEOUS | Status: DC | PRN

## 2020-01-24 MED ORDER — ALTEPLASE 2 MG IJ SOLR: 2.0000 mg | Freq: Once | INTRAMUSCULAR | Status: DC | PRN

## 2020-01-24 MED ORDER — CHLORHEXIDINE GLUCONATE CLOTH 2 % EX PADS: 6.0000 | MEDICATED_PAD | Freq: Every day | CUTANEOUS | Status: DC

## 2020-01-24 MED ORDER — HEPARIN SODIUM (PORCINE) 1000 UNIT/ML DIALYSIS
1000.0000 [IU] | INTRAMUSCULAR | Status: DC | PRN
  Filled 2020-01-24: qty 1

## 2020-01-24 NOTE — Progress Notes (Signed)
[facility] PHYSICAL MEDICINE & REHABILITATION PROGRESS NOTE   Subjective/Complaints:   Occasional phantom limb pain bilateral lower extremities, loose stools have improved, no further nausea ROS-    Pt denies SOB, abd pain, CP, N/V/C/D,     Objective:   No results found. Recent Labs    01/22/20 1503  WBC 7.4  HGB 8.8*  HCT 29.3*  PLT 127*   Recent Labs    01/22/20 1504  NA 137  K 4.5  CL 104  CO2 22  GLUCOSE 162*  BUN 45*  CREATININE 3.97*  CALCIUM 8.0*    Intake/Output Summary (Last 24 hours) at 01/24/2020 1215 Last data filed at 01/24/2020 0700 Gross per 24 hour  Intake 320 ml  Output 1 ml  Net 319 ml     Physical Exam: Vital Signs Blood pressure (!) 144/56, pulse (!) 59, temperature 98.3 F (36.8 C), resp. rate 19, weight 68.3 kg, SpO2 99 %.   General: No acute distress Mood and affect are appropriate Heart: Regular rate and rhythm no rubs murmurs or extra sounds Lungs: Clear to auscultation, breathing unlabored, no rales or wheezes Abdomen: Positive bowel sounds, soft nontender to palpation, nondistended Extremities: No clubbing, cyanosis, or edema Skin: No evidence of breakdown, no evidence of rash   Neurologic: Cranial nerves II through XII intact, motor strength is 5/5 in bilateral deltoid, bicep, tricep, grip, hip flexor  Musculoskeletal:well healed Left BKA short residual, RIght BKA image below   Left image 1 week ago, right image 9/6      Assessment/Plan: 1. Functional deficits secondary to Right BKA revision, hx Left BKA which require 3+ hours per day of interdisciplinary therapy in a comprehensive inpatient rehab setting.  Physiatrist is providing close team supervision and 24 hour management of active medical problems listed below.  Physiatrist and rehab team continue to assess barriers to discharge/monitor patient progress toward functional and medical goals  Care Tool:  Bathing    Body parts bathed by patient: Right arm,  Left arm, Chest, Abdomen, Front perineal area, Buttocks, Right upper leg, Left upper leg, Face     Body parts n/a: Right lower leg, Left lower leg   Bathing assist Assist Level: Contact Guard/Touching assist     Upper Body Dressing/Undressing Upper body dressing   What is the patient wearing?: Pull over shirt    Upper body assist Assist Level: Set up assist    Lower Body Dressing/Undressing Lower body dressing      What is the patient wearing?: Incontinence brief, Pants     Lower body assist Assist for lower body dressing: Supervision/Verbal cueing     Toileting Toileting    Toileting assist Assist for toileting: Minimal Assistance - Patient > 75%     Transfers Chair/bed transfer  Transfers assist     Chair/bed transfer assist level: Contact Guard/Touching assist (sliding board)     Locomotion Ambulation   Ambulation assist   Ambulation activity did not occur: Safety/medical concerns (status as B BKA)          Walk 10 feet activity   Assist  Walk 10 feet activity did not occur: Safety/medical concerns (status as B BKA)        Walk 50 feet activity   Assist Walk 50 feet with 2 turns activity did not occur: Safety/medical concerns (status as B BKA)         Walk 150 feet activity   Assist Walk 150 feet activity did not occur: Safety/medical concerns (status as B  BKA)         Walk 10 feet on uneven surface  activity   Assist Walk 10 feet on uneven surfaces activity did not occur: Safety/medical concerns (status as B BKA)         Wheelchair     Assist Will patient use wheelchair at discharge?: Yes Type of Wheelchair: Manual    Wheelchair assist level: Supervision/Verbal cueing Max wheelchair distance: 193ft    Wheelchair 50 feet with 2 turns activity    Assist        Assist Level: Supervision/Verbal cueing   Wheelchair 150 feet activity     Assist      Assist Level: Supervision/Verbal cueing   Blood  pressure (!) 144/56, pulse (!) 59, temperature 98.3 F (36.8 C), resp. rate 19, weight 68.3 kg, SpO2 99 %.    Medical Problem List and Plan: 1.  Decreased functional ability secondary to cellulitis/osteomyelitis right BKA with history of bilateral BKA.  Status post right BKA revision 01/12/2020.  Wound VAC has been discontinued             -patient may  Shower if R BKA wrapped             -ELOS/Goals: 5-9 days- mod I to supervision 2.  Antithrombotics: -DVT/anticoagulation: Eliquis             -antiplatelet therapy: Aspirin 81 mg daily 3. Pain Management: Oxycodone as needed 4. Mood: Zoloft 50 mg daily.  Provide emotional support             -antipsychotic agents: N/A 5. Neuropsych: This patient is capable of making decisions on his own behalf. 6. Skin/Wound Care: Routine skin checks 7. Fluids/Electrolytes/Nutrition: Routine in and outs with follow-up chemistries 8.  Acute kidney injury on CKD stage IV with history of kidney transplant 2016.  Long-term dialysis now initiated 01/04/2020.  Outpatient dialysis set up for Tuesday Thursday Saturday at [facility] dialysis center.  Continue low-dose chronic prednisone, Myfortic 180 mg twice daily as well as Prograf 2 mg twice daily 9.  Chronic anemia.  Follow-up CBC.  Continue Aranesp 10.  Diabetes mellitus with peripheral neuropathy (on chronic prednisone).  Hemoglobin A1c CBG (last 3)  Recent Labs    01/23/20 2121 01/24/20 0613 01/24/20 1142  GLUCAP 248* 131* 164*  9/5- BGs overall controlled- con't regimen  NovoLog 2 units 3 times daily does not use at home will just use SSI , Lantus insulin 8 units dail may need to dial back if am CBG remains low - will cont current regimen of Lantus 8U and SSI for now  11.  Atrial fibrillation.  Amiodarone as directed.  Continue Eliquis.  Cardiac rate controlled  9/5- rate controlled- con't regimen 12.  Hyperlipidemia.  Lipitor 13. Stage II sacral pressure ulcer- on admission- will need to con't foam  dressing, keep pt off backside unless eating or doing therapy- might benefit from air mattress if won't impair transfers.   9/5- in bedside chair  14.  History of constipation improved with laxatives 15. L BKA callus, pressure spot, follow-up orthotics/prosthetics    LOS: 6 days A FACE TO FACE EVALUATION WAS PERFORMED  Charlett Blake 01/24/2020, 12:15 PM

## 2020-01-24 NOTE — Progress Notes (Addendum)
Occupational Therapy Session Note  Patient Details  Name: Paul [name] MRN: [id number] Date of Birth: March 24, 1956  Today's Date: 01/24/2020 OT Individual Time: 4193-7902  40 mins therapy      Short Term Goals: Week 1:  OT Short Term Goal 1 (Week 1): Short term goals equal to LTGs set at overall supervision level based on ELOS.  Skilled Therapeutic Interventions/Progress Updates:    Session 1:  ([id number])  Pt seen for OT treatment, agreeable to ADL tasks.  He was able to complete sliding board transfer from the wheelchair to the tub bench with min guard assist using the sliding board for support.  He removed all clothing in sitting with lateral leans and min guard assist.  Pt with some bowel incontinence in the brief as well which he needed initial min assist for to clean up.  He completed all bathing with supervision sitting on the seat and lateral leans for washing buttocks.  He then completed transfer out to the wheelchair with min guard and use of the sliding board.  Pillow case was placed on the board to help reduce skin contact.  He attempted LB dressing from the wheelchair initially per his request to try.  After having some trouble getting the brief over his hips, he instead transferred back to the EOB with use of the sliding board and min guard assist, and then worked on pulling them up over his hips in supine rolling.  He donned his shorts in the same manner with supervision as well. UB dressing was completed with setup as well as min guard assist for transfer back to the wheelchair.  Finished session with education on therapy putty exercises for both hands.  He currently uses the green light resistance putty for gross digit flexion as well as tip to tip pinch.  He also completed 2 sets of 5 reps for wheelchair pushups with min to mod facilitation.  He was left up at end of session with the call button and phone in reach and safety belt in place.    Session 2: [id number])  Pt up in wheelchair  to start session.  He reported some back pain earlier this am from transfer to the shower or from some particular way he moved when working on ADL, but this was not improved.  He was able to propel his wheelchair down to the therapy gym with supervision where he worked on further UE strengthening with use of the ergonometer.  He was able to complete 2 sets of 10 mins with resistance on level 8 and RPMs maintained at level 20-25.  He was able to complete one set of 10 mins peddling forward and then the other peddling in reverse.  He then rolled himself back to the room after a short rest break.  Next, he practiced drop arm commode transfers with use of the sliding board as well as practicing wrapping his residual limb with the ace bandage.  Supervision for wrapping with min guard for transfer to and from the drop arm.  Finished session with pt sitting up in the wheelchair with the call button and phone in reach and safety belt in place.    Therapy Documentation Precautions:  Precautions Precautions: Fall Precaution Comments: B BKA, no pillow under R knee Restrictions Weight Bearing Restrictions: Yes RLE Weight Bearing: Non weight bearing  Pain: Pain Assessment Pain Scale: Faces Pain Score: 1  Pain Type: Surgical pain Pain Location: Leg Pain Orientation: Right Pain Descriptors / Indicators: Discomfort Pain Onset:  With Activity ADL: See Care Tool Section for some details of mobility and selfcare  Therapy/Group: Individual Therapy  Littie Chiem OTR/L  8:42 AM

## 2020-01-24 NOTE — Progress Notes (Signed)
Physical Therapy Session Note  Patient Details  Name: Paul [name] MRN: [id number] Date of Birth: 1955-09-11  Today's Date: 01/24/2020 PT Individual Time: 6503-5465 PT Individual Time Calculation (min): 57 min   Short Term Goals: Week 1:  PT Short Term Goal 1 (Week 1): STG=LTG based on ELOS  Skilled Therapeutic Interventions/Progress Updates:     Pt received seated in WC and agreeable to therapy. Reports pain in lower back in R SI region. No number provided. PT provides manual therapy and mat exercises to address pain. WC transport to gym for time management. Pt performs slideboard transfer from [facility] to mat with minA and PT stabilizing WC. PT provides manual pressure on R SI for pain relief. Sit to supine with supervision. Pt performs BLE SLRs 2x10, reporting increase in pain with eccentric control of RLE. Pt then performs alternating BLE knee to chest with manual overpressure for increased soft tissue lengthening, holding for 1-2 minutes. Supine to prone with minA. In prone, PT provides grade 1 AP mobilization at R SI for pain relief, combined with passive hip extension. PT also cues for increased transverse abdominus activation for core stability and pain relief. Pt reports some improvement in symptoms following treatment. Prone to supine to seated position with CGA. Mat>WC with slideboard and minA. Pt propels WC back to room with BUEs. Left seated in WC with all needs within reach.  Therapy Documentation Precautions:  Precautions Precautions: Fall Precaution Comments: B BKA, no pillow under R knee Restrictions Weight Bearing Restrictions: Yes RLE Weight Bearing: Non weight bearing    Therapy/Group: Individual Therapy  Breck Coons, PT, DPT 01/24/2020, 8:00 AM

## 2020-01-24 NOTE — Progress Notes (Signed)
[facility] Progress Note    Assessment/ Plan:   1. Acute kidney injury on CKD4, now progressed to ESRD: BL cr 2.8-3.4 s/p DDRT with DGF complicated by ACR and antibody mediated rejection requiring IVIG as recently as 2018.  Started dialysis 01/04/2020   No sign of renal recovery therefore we will continue TTS schedule for dialysis. He has outpatient dialysis set up for TTS at [facility]. Patient has functional dialysis access.  Status post dialysis 01/22/2020 with 2.0 L removed.  Next dialysis treatment 01/25/2020  2. Kidney transplant: Continue tacrolimus, mycophenolate, prednisone-tac tapered to 2/2 on 8/18->mycophenolate tapered to 180 mg twice daily on 8/22. Pharmacy ordering tacrolimus level since patient is on amiodarone, follow-up.  Last tacrolimus level was less than 1 12/31/2019  3. Hypertension/volume: Continue amlodipine. Managing volume with dialysis.  4. Secondary hyperparathyroidism/hyperphosphatemia: PTH level 43. Phosphorus level improving,off binders, monitor lab.  5. Anemia of CKD, acuteblood loss anemia:Received blood transfusion.Continue Aranesp  Iron saturation 81 Monitor hemoglobin and transfuse as needed.  6. Severe sepsis due to right leg osteomyelitis/abscess on BKA stump site:Treated with antibiotics.Status post revision of right below-knee amputation by Dr. Sharol Given on 8/25.   Transfered to CIR   Subjective:    NAD, just finished all therapies.  No complaints   Objective:   BP (!) 142/59 (BP Location: Left Arm)   Pulse (!) 57   Temp (!) 97.4 F (36.3 C)   Resp 16   Wt 68.3 kg   SpO2 98%   BMI 20.42 kg/m   Intake/Output Summary (Last 24 hours) at 01/24/2020 1550 Last data filed at 01/24/2020 1300 Gross per 24 hour  Intake 540 ml  Output 1 ml  Net 539 ml   Weight change: -0.9 kg  Physical Exam: Gen: NAD, sitting in chair CVS: RRR no m/r/g Resp: clear Abd: soft, NABS Ext: s/p bilateral BKA  Imaging: No results  found.  Labs: BMET Recent Labs  Lab 01/18/20 0553 01/20/20 1400 01/22/20 1504  NA 136 138 137  K 5.1 4.7 4.5  CL 103 106 104  CO2 23 21* 22  GLUCOSE 169* 124* 162*  BUN 47* 41* 45*  CREATININE 4.14* 3.97* 3.97*  CALCIUM 8.4* 8.0* 8.0*  PHOS 4.3 4.2 4.6   CBC Recent Labs  Lab 01/18/20 0553 01/20/20 1400 01/22/20 1503  WBC 9.5 7.6 7.4  HGB 9.9* 8.5* 8.8*  HCT 31.8* 27.0* 29.3*  MCV 92.4 96.1 96.4  PLT 144* 123* 127*    Medications:    . amiodarone  200 mg Oral Daily  . amLODipine  5 mg Oral Daily  . apixaban  5 mg Oral BID  . aspirin EC  81 mg Oral Daily  . atorvastatin  10 mg Oral Q M,W,F  . bisacodyl  10 mg Oral Daily  . Chlorhexidine Gluconate Cloth  6 each Topical Q0600  . Chlorhexidine Gluconate Cloth  6 each Topical Q0600  . cholecalciferol  4,000 Units Oral Daily  . darbepoetin (ARANESP) injection - DIALYSIS  60 mcg Intravenous Q Thu-HD  . feeding supplement (NEPRO CARB STEADY)  237 mL Oral TID WC  . insulin aspart  0-9 Units Subcutaneous TID WC  . insulin glargine  8 Units Subcutaneous Daily  . lipase/protease/amylase  12,000 Units Oral TID AC  . magnesium chloride  2 tablet Oral BID  . mycophenolate  180 mg Oral BID  . pantoprazole  40 mg Oral Daily  . polyethylene glycol  17 g Oral Daily  . predniSONE  5 mg  Oral Q breakfast  . sertraline  50 mg Oral Daily  . tacrolimus  2 mg Oral BID  . tamsulosin  0.4 mg Oral QPC supper      Madelon Lips MD 01/24/2020, 3:50 PM

## 2020-01-25 ENCOUNTER — Inpatient Hospital Stay (HOSPITAL_COMMUNITY): Payer: Managed Care, Other (non HMO) | Admitting: Occupational Therapy

## 2020-01-25 ENCOUNTER — Inpatient Hospital Stay (HOSPITAL_COMMUNITY): Payer: Managed Care, Other (non HMO) | Admitting: Physical Therapy

## 2020-01-25 LAB — GLUCOSE, CAPILLARY
Glucose-Capillary: 94 mg/dL (ref 70–99)
Glucose-Capillary: 149 mg/dL — ABNORMAL HIGH (ref 70–99)
Glucose-Capillary: 139 mg/dL — ABNORMAL HIGH (ref 70–99)
Glucose-Capillary: 218 mg/dL — ABNORMAL HIGH (ref 70–99)

## 2020-01-25 MED ORDER — MUSCLE RUB 10-15 % EX CREA
TOPICAL_CREAM | Freq: Two times a day (BID) | CUTANEOUS | Status: DC | PRN
  Filled 2020-01-25: qty 85

## 2020-01-25 NOTE — Progress Notes (Signed)
Occupational Therapy Session Note  Patient Details  Name: Paul [name] MRN: [id number] Date of Birth: [birth year]  Today's Date: 01/25/2020 OT Individual Time: 0905-1003 OT Individual Time Calculation (min): 58 min    Short Term Goals: Week 1:  OT Short Term Goal 1 (Week 1): Short term goals equal to LTGs set at overall supervision level based on ELOS.  Skilled Therapeutic Interventions/Progress Updates:    Pt up in wheelchair to start session.  As therapist entered the room he noticed the smell of BM.  Pt with no awareness of this, but agreeable to let OT check and see if he had had a BM.  Pt with bowel incontinence in his brief.  He was able to transfer anteriorly onto the bed and transition to supine with supervision.  He removed his shorts rolling side to side with supervision and then therapist provided mod assist for removal of tear away brief.  Therapist also provided max assist to clean up BM.  Once this was completed, he was able to donn his brief and shorts in supine rolling with supervision.  He then completed posterior transfer to the wheelchair at supervision as well.  Next, pt transitioned to the shower where he practiced simulated shower transfers with use of the sliding board.  He was able to complete X2 with min guard assist.  Based on the setup of his shower at home and barriers, he can only achieve approximately 45 degree angle to the seat and must complete transfer with the board at a slightly more forward position.  Will continue with practicing.  He was able to finish session with completion of BUE therapy band exercises with use of the light orange level 1 band.  One set of 10 repetitions was completed for shoulder flexion, shoulder row, and shoulder horizontal abduction.  He then completed 1 set of 15 reps for triceps extension.  He was left sitting up in the wheelchair with the call button and phone in reach and safety belt in place.  Discussed having home health OT vs no OT and  just PT at outpatient.  Both therapist and pt feel HHOT would be more beneficial for actual practice of setup of DME and use in familiar environment.   Therapy Documentation Precautions:  Precautions Precautions: Fall Precaution Comments: B BKA, no pillow under R knee Restrictions Weight Bearing Restrictions: Yes RLE Weight Bearing: Non weight bearing  Pain: Pain Assessment Pain Scale: Faces Pain Score: 0-No pain ADL: See Care Tool Section for some details of mobility and selfcare  Therapy/Group: Individual Therapy  Tanveer Dobberstein OTR/L 01/25/2020, 12:39 PM

## 2020-01-25 NOTE — Progress Notes (Signed)
Physical Therapy Session Note  Patient Details  Name: Paul [name] MRN: [id number] Date of Birth: Aug 12, 1955  Today's Date: 01/25/2020 PT Individual Time: 1050-1205 PT Individual Time Calculation (min): 75 min   Short Term Goals: Week 1:  PT Short Term Goal 1 (Week 1): STG=LTG based on ELOS  Skilled Therapeutic Interventions/Progress Updates:    Pt received sitting in w/c and agreeable to therapy session. Discussed home measurements provided by patient's wife which state that the w/c seat height is 21inches, car height is 29inches, cough height is 18inches, and recliner seat height is 19inches. Discussed that given 8inch difference in w/c height and car seat height that it will not be a safe functional slide board transfer - pt reports that he was not able to do this in the past either when he felt stronger. Therapist planning to follow-up with Margreta Journey, SW regarding alternate transportation methods as pt primarily uses [facility] - also discussed with pt having his L LE prosthetic modified to allow improved ability to use that leg to clear his hips during transfers, pt in agreement. Pt reports need to use bathroom. Slide board transfer w/c<>BSC with therapist providing mod cuing for proper board placement and CGA for steadying going to [facility] but then min assist for stabilizing board when transferring back to w/c due to it sliding on BSC (pt has a BSC with a flat top surface at home which should allow this transfer to be more stable). Transported to/from gym in w/c for time management and energy conservation. R lateral scoot transfer w/c>recliner using transfer board, continued cuing for safe board placement, CGA for steadying during this ~4inch downhill transfer. L lateral scoot recliner>w/c using transfer board with mod assist for scooting hips uphill wearing L LE prosthetic to increase pt safety and increase pt's ability to assist with transfer. Gerald Stabs, prosthetist from [place], present to assess pt's L LE  prosthetic fit - discussed the "knot/bump" on L lateral distal end of residual limb with Gerald Stabs stating that was not present last time pt had prosthetics assessed, discussed that pt has a "pinching" pain at that spot when pt stood with this therapist last week - also discussed benefits of shortening the prosthetic to allow increased leverage to clear hips during transfers with pt in agreement - Gerald Stabs planning to make appropriate adjustments and return tomorrow for further assessment. Block practice lateral scoot transfers w/c<>EOM using transfer board working on improved board placement with decreased cuing and improved hip clearance during transfer - mod progressed to min cuing. Seated w/c push ups x8 reps with education on importance of strengthening triceps, scapular depressors, and core musculature to improve ability to clear hips during transfers and decrease shearing forces for improved skin integrity. Pt demonstrated recall of prior education regarding proper R LE residual limb wrapping with min cuing. Transported back to room in w/c and left seated with needs in reach.  Therapy Documentation Precautions:  Precautions Precautions: Fall Precaution Comments: B BKA, no pillow under R knee Restrictions Weight Bearing Restrictions: Yes RLE Weight Bearing: Non weight bearing  Pain: Denies any pain in R LE. Reports only a little pain in R mid back which pt states is significantly improved compared to over the weekend - pt reports that on Saturday the pain was most notable when he was lying supine and performing R LE straight leg raise, repeated that movement today with only a little pain.   Therapy/Group: Individual Therapy  Tawana Scale , PT, DPT, CSRS  01/25/2020, 8:00 AM

## 2020-01-25 NOTE — Progress Notes (Signed)
Occupational Therapy Session Note  Patient Details  Name: Paul [name] MRN: [id number] Date of Birth: [birth year]  Today's Date: 01/25/2020 OT Individual Time: 8592-7639 OT Individual Time Calculation (min): 46 min    Short Term Goals: Week 1:  OT Short Term Goal 1 (Week 1): Short term goals equal to LTGs set at overall supervision level based on ELOS.  Skilled Therapeutic Interventions/Progress Updates:    Patient seated in w/c, alert and ready for therapy session.  He denies pain at this time.  He states that he would like to work on core strength this session.  He is able to propel w/c to and from therapy gym.  He is able to set up w/c (leg and arm rests), lock brakes, place SB and complete SB transfers to/from mat with CS/CGA.  Completed trunk/pelvis mobility activities, core strengthening, UB moblity/strengthening, COG/balance activities both in long sit and with knees flexed at edge of mat table.  Utilized box surface, push up blocks and theraband.  Reviewed posture and seating, positioning for w/c mobility and ongoing proximal stability activities.  He demonstrates good understanding.  He remained seated in w/c at close of session with seat belt alarm set and call bell in reach.    Therapy Documentation Precautions:  Precautions Precautions: Fall Precaution Comments: B BKA, no pillow under R knee Restrictions Weight Bearing Restrictions: Yes RLE Weight Bearing: Non weight bearing   Therapy/Group: Individual Therapy  Carlos Levering 01/25/2020, 7:34 AM

## 2020-01-25 NOTE — Progress Notes (Signed)
Renal Navigator spoke with patient at HD bedside. He reports a projected discharge date from [facility] this Friday, 01/28/20. He is a new start to [place] [facility], which means he will need to sign paperwork at clinic on Friday after discharge in order to start on a Saturday. Navigator explained this to patient and will follow up with CIR CSW tomorrow. Patient states he will require wheelchair transportation at discharge, as he will not be able to get in to any other vehicle. Patient's initial [facility] [place] application was completed on 01/07/20 and since it was to re-certify him, Eligibility Coordinator has previously informed Renal Navigator that his application would take up to 21 days to process. Navigator has sent secure message to her today to follow up.  Navigator will continue to follow.  Paul [name], [place] Renal Navigator [phone]

## 2020-01-25 NOTE — Progress Notes (Signed)
[facility] Progress Note    Assessment/ Plan:   1. Acute kidney injury on CKD4, now progressed to ESRD: BL cr 2.8-3.4 s/p DDRT with DGF complicated by ACR and antibody mediated rejection requiring IVIG as recently as 2018.  Started dialysis 01/04/2020.  No sign of renal recovery therefore we will continue TTS schedule for dialysis. He has outpatient dialysis set up for TTS at [facility]. Patient has functional dialysis access.  Status post dialysis 01/22/2020 with 2.0 L removed.  Next dialysis treatment 01/25/2020  2. Kidney transplant: Tapering IS- prednisone-tac tapered to 2/2 on 8/18->mycophenolate tapered to 180 mg twice daily on 8/22. Pharmacy ordering tacrolimus level since patient is on amiodarone, follow-up.  Last tacrolimus level was less than 1 12/31/2019  3. Hypertension/volume: Continue amlodipine. Managing volume with dialysis.  4. Secondary hyperparathyroidism/hyperphosphatemia: PTH level 43. Phosphorus level improving,off binders, monitor lab.  5. Anemia of CKD, acuteblood loss anemia:Received blood transfusion.Continue Aranesp  Iron saturation 81 Monitor hemoglobin and transfuse as needed.  6. Severe sepsis due to right leg osteomyelitis/abscess on BKA stump site:Treated with antibiotics.Status post revision of right below-knee amputation by Dr. Sharol Given on 8/25.   Transfered to CIR   Subjective:    Seen on dialysis.  No issues.     Objective:   BP 127/62 (BP Location: Left Arm)   Pulse (!) 55   Temp 98.5 F (36.9 C) (Oral)   Resp 18   Wt 70.4 kg   SpO2 98%   BMI 21.05 kg/m   Intake/Output Summary (Last 24 hours) at 01/25/2020 1622 Last data filed at 01/25/2020 1307 Gross per 24 hour  Intake 780 ml  Output 801 ml  Net -21 ml   Weight change:   Physical Exam: Gen: NAD, sitting on HD CVS: RRR no m/r/g Resp: clear Abd: soft, NABS Ext: s/p bilateral BKA ACCESS: R RC AVF + T/B  Imaging: No results found.  Labs: BMET Recent  Labs  Lab 01/20/20 1400 01/22/20 1504 01/24/20 1610  NA 138 137 136  K 4.7 4.5 4.5  CL 106 104 100  CO2 21* 22 23  GLUCOSE 124* 162* 209*  BUN 41* 45* 58*  CREATININE 3.97* 3.97* 3.88*  CALCIUM 8.0* 8.0* 8.0*  PHOS 4.2 4.6 5.8*   CBC Recent Labs  Lab 01/20/20 1400 01/22/20 1503 01/24/20 1610  WBC 7.6 7.4 8.2  HGB 8.5* 8.8* 9.2*  HCT 27.0* 29.3* 30.2*  MCV 96.1 96.4 96.2  PLT 123* 127* 138*    Medications:    . amiodarone  200 mg Oral Daily  . amLODipine  5 mg Oral Daily  . apixaban  5 mg Oral BID  . aspirin EC  81 mg Oral Daily  . atorvastatin  10 mg Oral Q M,W,F  . bisacodyl  10 mg Oral Daily  . Chlorhexidine Gluconate Cloth  6 each Topical Q0600  . cholecalciferol  4,000 Units Oral Daily  . darbepoetin (ARANESP) injection - DIALYSIS  60 mcg Intravenous Q Thu-HD  . feeding supplement (NEPRO CARB STEADY)  237 mL Oral TID WC  . insulin aspart  0-9 Units Subcutaneous TID WC  . insulin glargine  8 Units Subcutaneous Daily  . lipase/protease/amylase  12,000 Units Oral TID AC  . magnesium chloride  2 tablet Oral BID  . mycophenolate  180 mg Oral BID  . pantoprazole  40 mg Oral Daily  . polyethylene glycol  17 g Oral Daily  . predniSONE  5 mg Oral Q breakfast  . sertraline  50  mg Oral Daily  . tacrolimus  2 mg Oral BID  . tamsulosin  0.4 mg Oral QPC supper      Madelon Lips MD 01/25/2020, 4:22 PM

## 2020-01-25 NOTE — Progress Notes (Signed)
[facility] PHYSICAL MEDICINE & REHABILITATION PROGRESS NOTE   Subjective/Complaints:  Right sided back and hip pain.  No falls or other trauma to the area.  Pain is relieved by oxycodone patient looking for other options ROS-    Pt denies SOB, abd pain, CP, N/V/D     Objective:   No results found. Recent Labs    01/22/20 1503 01/24/20 1610  WBC 7.4 8.2  HGB 8.8* 9.2*  HCT 29.3* 30.2*  PLT 127* 138*   Recent Labs    01/22/20 1504 01/24/20 1610  NA 137 136  K 4.5 4.5  CL 104 100  CO2 22 23  GLUCOSE 162* 209*  BUN 45* 58*  CREATININE 3.97* 3.88*  CALCIUM 8.0* 8.0*    Intake/Output Summary (Last 24 hours) at 01/25/2020 0907 Last data filed at 01/25/2020 0811 Gross per 24 hour  Intake 760 ml  Output 500 ml  Net 260 ml     Physical Exam: Vital Signs Blood pressure (!) 141/75, pulse 60, temperature 98.9 F (37.2 C), resp. rate 18, weight 68.3 kg, SpO2 98 %.    General: No acute distress Mood and affect are appropriate Heart: Regular rate and rhythm no rubs murmurs or extra sounds Lungs: Clear to auscultation, breathing unlabored, no rales or wheezes Abdomen: Positive bowel sounds, soft nontender to palpation, nondistended Extremities: No clubbing, cyanosis, or edema Skin: No evidence of breakdown, no evidence of rash  Neurologic: Cranial nerves II through XII intact, motor strength is 5/5 in bilateral deltoid, bicep, tricep, grip, hip flexor  Musculoskeletal:well healed Left BKA short residual, RIght BKA image below, mild tenderness right gluteus medius area   Left image 1 week ago, right image 9/6      Assessment/Plan: 1. Functional deficits secondary to Right BKA revision, hx Left BKA which require 3+ hours per day of interdisciplinary therapy in a comprehensive inpatient rehab setting.  Physiatrist is providing close team supervision and 24 hour management of active medical problems listed below.  Physiatrist and rehab team continue to assess  barriers to discharge/monitor patient progress toward functional and medical goals  Care Tool:  Bathing    Body parts bathed by patient: Right arm, Left arm, Chest, Abdomen, Front perineal area, Buttocks, Right upper leg, Left upper leg, Face     Body parts n/a: Right lower leg, Left lower leg   Bathing assist Assist Level: Contact Guard/Touching assist     Upper Body Dressing/Undressing Upper body dressing   What is the patient wearing?: Pull over shirt    Upper body assist Assist Level: Set up assist    Lower Body Dressing/Undressing Lower body dressing      What is the patient wearing?: Incontinence brief, Pants     Lower body assist Assist for lower body dressing: Supervision/Verbal cueing     Toileting Toileting    Toileting assist Assist for toileting: Minimal Assistance - Patient > 75%     Transfers Chair/bed transfer  Transfers assist     Chair/bed transfer assist level: Minimal Assistance - Patient > 75% (slideboard)     Locomotion Ambulation   Ambulation assist   Ambulation activity did not occur: Safety/medical concerns (status as B BKA)          Walk 10 feet activity   Assist  Walk 10 feet activity did not occur: Safety/medical concerns (status as B BKA)        Walk 50 feet activity   Assist Walk 50 feet with 2 turns activity did not  occur: Safety/medical concerns (status as B BKA)         Walk 150 feet activity   Assist Walk 150 feet activity did not occur: Safety/medical concerns (status as B BKA)         Walk 10 feet on uneven surface  activity   Assist Walk 10 feet on uneven surfaces activity did not occur: Safety/medical concerns (status as B BKA)         Wheelchair     Assist Will patient use wheelchair at discharge?: Yes Type of Wheelchair: Manual    Wheelchair assist level: Supervision/Verbal cueing Max wheelchair distance: 164ft    Wheelchair 50 feet with 2 turns activity    Assist         Assist Level: Supervision/Verbal cueing   Wheelchair 150 feet activity     Assist      Assist Level: Supervision/Verbal cueing   Blood pressure (!) 141/75, pulse 60, temperature 98.9 F (37.2 C), resp. rate 18, weight 68.3 kg, SpO2 98 %.    Medical Problem List and Plan: 1.  Decreased functional ability secondary to cellulitis/osteomyelitis right BKA with history of bilateral BKA.  Status post right BKA revision 01/12/2020.  Wound VAC has been discontinued          Continue CIR PT OT, team conference in a.m.             -ELOS/Goals: 5-9 days- mod I to supervision 2.  Antithrombotics: -DVT/anticoagulation: Eliquis             -antiplatelet therapy: Aspirin 81 mg daily 3. Pain Management: Oxycodone as needed Right gluteus medius strain will order Sportscreme 4. Mood: Zoloft 50 mg daily.  Provide emotional support             -antipsychotic agents: N/A 5. Neuropsych: This patient is capable of making decisions on his own behalf. 6. Skin/Wound Care: Routine skin checks 7. Fluids/Electrolytes/Nutrition: Routine in and outs with follow-up chemistries 8.  Acute kidney injury on CKD stage IV with history of kidney transplant 2016.  Long-term dialysis now initiated 01/04/2020.  Outpatient dialysis set up for Tuesday Thursday Saturday at [facility] dialysis center.  Continue low-dose chronic prednisone, Myfortic 180 mg twice daily as well as Prograf 2 mg twice daily 9.  Chronic anemia.  Follow-up CBC.  Continue Aranesp 10.  Diabetes mellitus with peripheral neuropathy (on chronic prednisone).  Hemoglobin A1c CBG (last 3)  Recent Labs    01/24/20 1646 01/24/20 2114 01/25/20 0625  GLUCAP 213* 140* 94  9/5- BGs overall controlled- con't regimen  NovoLog 2 units 3 times daily does not use at home will just use SSI , Lantus insulin 8 units dail may need to dial back if am CBG remains low - will cont current regimen of Lantus 8U and SSI for now  A.m. CBGs okay for now continue  current regimen 11.  Atrial fibrillation.  Amiodarone as directed.  Continue Eliquis.  Cardiac rate controlled  9/5- rate controlled- con't regimen 12.  Hyperlipidemia.  Lipitor 13. Stage II sacral pressure ulcer- on admission- will need to con't foam dressing, keep pt off backside unless eating or doing therapy- might benefit from air mattress if won't impair transfers.   9/5- in bedside chair  14.  History of constipation improved with laxatives 15. L BKA callus, pressure spot, follow-up orthotics/prosthetics    LOS: 7 days A FACE TO FACE EVALUATION WAS PERFORMED  Charlett Blake 01/25/2020, 9:07 AM

## 2020-01-26 ENCOUNTER — Inpatient Hospital Stay (HOSPITAL_COMMUNITY): Payer: Managed Care, Other (non HMO) | Admitting: Occupational Therapy

## 2020-01-26 ENCOUNTER — Inpatient Hospital Stay (HOSPITAL_COMMUNITY): Payer: Managed Care, Other (non HMO) | Admitting: Physical Therapy

## 2020-01-26 ENCOUNTER — Ambulatory Visit: Payer: Managed Care, Other (non HMO) | Admitting: Rehabilitation

## 2020-01-26 LAB — GLUCOSE, CAPILLARY
Glucose-Capillary: 85 mg/dL (ref 70–99)
Glucose-Capillary: 124 mg/dL — ABNORMAL HIGH (ref 70–99)
Glucose-Capillary: 128 mg/dL — ABNORMAL HIGH (ref 70–99)
Glucose-Capillary: 178 mg/dL — ABNORMAL HIGH (ref 70–99)

## 2020-01-26 MED ORDER — LIVING WELL WITH DIABETES BOOK
Freq: Once | Status: AC
  Filled 2020-01-26: qty 1

## 2020-01-26 MED ORDER — BLOOD PRESSURE CONTROL BOOK
Freq: Once | Status: AC
  Filled 2020-01-26: qty 1

## 2020-01-26 NOTE — Progress Notes (Signed)
[facility] PHYSICAL MEDICINE & REHABILITATION PROGRESS NOTE   Subjective/Complaints: Discussed DC date with recommended ext per PT.  Pt is happy about this .  Prosthetic adjustment was helpful for him  ROS-    Pt denies SOB, abd pain, CP, N/V/D     Objective:   No results found. Recent Labs    01/24/20 1610  WBC 8.2  HGB 9.2*  HCT 30.2*  PLT 138*   Recent Labs    01/24/20 1610  NA 136  K 4.5  CL 100  CO2 23  GLUCOSE 209*  BUN 58*  CREATININE 3.88*  CALCIUM 8.0*    Intake/Output Summary (Last 24 hours) at 01/26/2020 0918 Last data filed at 01/26/2020 0729 Gross per 24 hour  Intake 360 ml  Output 3551 ml  Net -3191 ml     Physical Exam: Vital Signs Blood pressure (!) 137/53, pulse (!) 59, temperature 98.1 F (36.7 C), resp. rate 14, weight 66.3 kg, SpO2 100 %.    General: No acute distress Mood and affect are appropriate Heart: Regular rate and rhythm no rubs murmurs or extra sounds Lungs: Clear to auscultation, breathing unlabored, no rales or wheezes Abdomen: Positive bowel sounds, soft nontender to palpation, nondistended Extremities: No clubbing, cyanosis, or edema Skin: No evidence of breakdown, no evidence of rash  Neurologic: Cranial nerves II through XII intact, motor strength is 5/5 in bilateral deltoid, bicep, tricep, grip, hip flexor  Musculoskeletal:well healed Left BKA short residual, RIght BKA image below, mild tenderness right gluteus medius area   Left image 1 week ago, right image 9/6      Assessment/Plan: 1. Functional deficits secondary to Right BKA revision, hx Left BKA which require 3+ hours per day of interdisciplinary therapy in a comprehensive inpatient rehab setting.  Physiatrist is providing close team supervision and 24 hour management of active medical problems listed below.  Physiatrist and rehab team continue to assess barriers to discharge/monitor patient progress toward functional and medical goals  Care  Tool:  Bathing    Body parts bathed by patient: Right arm, Left arm, Chest, Abdomen, Front perineal area, Buttocks, Right upper leg, Left upper leg, Face     Body parts n/a: Right lower leg, Left lower leg   Bathing assist Assist Level: Contact Guard/Touching assist     Upper Body Dressing/Undressing Upper body dressing   What is the patient wearing?: Pull over shirt    Upper body assist Assist Level: Set up assist    Lower Body Dressing/Undressing Lower body dressing      What is the patient wearing?: Incontinence brief, Pants     Lower body assist Assist for lower body dressing: Supervision/Verbal cueing     Toileting Toileting    Toileting assist Assist for toileting: Maximal Assistance - Patient 25 - 49% (supine rolling secondary to incontinent episode)     Transfers Chair/bed transfer  Transfers assist     Chair/bed transfer assist level: Contact Guard/Touching assist (slide board)     Locomotion Ambulation   Ambulation assist   Ambulation activity did not occur: Safety/medical concerns (status as B BKA)          Walk 10 feet activity   Assist  Walk 10 feet activity did not occur: Safety/medical concerns (status as B BKA)        Walk 50 feet activity   Assist Walk 50 feet with 2 turns activity did not occur: Safety/medical concerns (status as B BKA)  Walk 150 feet activity   Assist Walk 150 feet activity did not occur: Safety/medical concerns (status as B BKA)         Walk 10 feet on uneven surface  activity   Assist Walk 10 feet on uneven surfaces activity did not occur: Safety/medical concerns (status as B BKA)         Wheelchair     Assist Will patient use wheelchair at discharge?: Yes Type of Wheelchair: Manual    Wheelchair assist level: Supervision/Verbal cueing Max wheelchair distance: 147ft    Wheelchair 50 feet with 2 turns activity    Assist        Assist Level: Supervision/Verbal  cueing   Wheelchair 150 feet activity     Assist      Assist Level: Supervision/Verbal cueing   Blood pressure (!) 137/53, pulse (!) 59, temperature 98.1 F (36.7 C), resp. rate 14, weight 66.3 kg, SpO2 100 %.    Medical Problem List and Plan: 1.  Decreased functional ability secondary to cellulitis/osteomyelitis right BKA with history of bilateral BKA.  Status post right BKA revision 01/12/2020.  Wound VAC has been discontinued          Continue CIR PT OT, team conference in a.m.             -ELOS/Goals: tent d/c 9/13 2.  Antithrombotics: -DVT/anticoagulation: Eliquis             -antiplatelet therapy: Aspirin 81 mg daily 3. Pain Management: Oxycodone as needed Right gluteus medius strain will order Sportscreme 4. Mood: Zoloft 50 mg daily.  Provide emotional support             -antipsychotic agents: N/A 5. Neuropsych: This patient is capable of making decisions on his own behalf. 6. Skin/Wound Care: Routine skin checks 7. Fluids/Electrolytes/Nutrition: Routine in and outs with follow-up chemistries 8.  Acute kidney injury on CKD stage IV with history of kidney transplant 2016.  Long-term dialysis now initiated 01/04/2020.  Outpatient dialysis set up for Tuesday Thursday Saturday at [facility] dialysis center.  Continue low-dose chronic prednisone, Myfortic 180 mg twice daily as well as Prograf 2 mg twice daily 9.  Chronic anemia.  Follow-up CBC.  Continue Aranesp 10.  Diabetes mellitus with peripheral neuropathy (on chronic prednisone).  Hemoglobin A1c CBG (last 3)  Recent Labs    01/25/20 1830 01/25/20 2107 01/26/20 0604  GLUCAP 139* 218* 85  some lability but overall good control some pm elevation 11.  Atrial fibrillation.  Amiodarone as directed.  Continue Eliquis.  Cardiac rate controlled  9/5- rate controlled- con't regimen 12.  Hyperlipidemia.  Lipitor 13. Stage II sacral pressure ulcer- on admission- will need to con't foam dressing, keep pt off backside unless  eating or doing therapy- might benefit from air mattress if won't impair transfers.   9/5- in bedside chair  14.  History of constipation improved with laxatives 15. L BKA callus, pressure spot, follow-up orthotics/prosthetics, inner liner removed discused fluctuating limb edema, now that pt is on HD   LOS: 8 days A FACE TO FACE EVALUATION WAS PERFORMED  Charlett Blake 01/26/2020, 9:18 AM

## 2020-01-26 NOTE — Progress Notes (Signed)
Patient ID: Paul [name], male   DOB: [birth year], 64 y.o.   MRN: [id number]  Team Conference Report to Patient/Family  Team Conference discussion was reviewed with the patient and caregiver, including goals, any changes in plan of care and target discharge date.  Patient and caregiver express understanding and are in agreement.  The patient has a target discharge date of 01/31/20.  Dyanne Iha 01/26/2020, 1:50 PM

## 2020-01-26 NOTE — Progress Notes (Signed)
[facility] Progress Note    Assessment/ Plan:   1. Acute kidney injury on CKD4, now progressed to ESRD: BL cr 2.8-3.4 s/p DDRT with DGF complicated by ACR and antibody mediated rejection requiring IVIG as recently as 2018.  Started dialysis 01/04/2020.  No sign of renal recovery therefore we will continue TTS schedule for dialysis. He has outpatient dialysis set up for TTS at [facility]. Patient has functional dialysis access.  Status post dialysis 01/22/2020 with 2.0 L removed.  Next dialysis treatment 01/25/2020  2. Kidney transplant: Tapering IS- prednisone-tac tapered to 2/2 on 8/18->mycophenolate tapered to 180 mg twice daily on 8/22.  Last tacrolimus level was less than 1 12/31/2019  3. Hypertension/volume: Continue amlodipine. Managing volume with dialysis.  4. Secondary hyperparathyroidism/hyperphosphatemia: PTH level 43. Phosphorus level improving,off binders, monitor lab.  5. Anemia of CKD, acuteblood loss anemia:Received blood transfusion.Continue Aranesp  Iron saturation 81 Monitor hemoglobin and transfuse as needed.  6. Severe sepsis due to right leg osteomyelitis/abscess on BKA stump site:Treated with antibiotics.Status post revision of right below-knee amputation by Dr. Sharol Given on 8/25.   Transfered to CIR   Subjective:    No issues today.  Working with PT.     Objective:   BP (!) 142/72 (BP Location: Left Arm)   Pulse 64   Temp 98.4 F (36.9 C) (Oral)   Resp 16   Wt 66.3 kg   SpO2 100%   BMI 19.82 kg/m   Intake/Output Summary (Last 24 hours) at 01/26/2020 1522 Last data filed at 01/26/2020 0729 Gross per 24 hour  Intake 120 ml  Output 3250 ml  Net -3130 ml   Weight change:   Physical Exam: Gen: NAD, sitting on HD CVS: RRR no m/r/g Resp: clear Abd: soft, NABS Ext: s/p bilateral BKA ACCESS: R RC AVF + T/B  Imaging: No results found.  Labs: BMET Recent Labs  Lab 01/20/20 1400 01/22/20 1504 01/24/20 1610  NA 138 137 136   K 4.7 4.5 4.5  CL 106 104 100  CO2 21* 22 23  GLUCOSE 124* 162* 209*  BUN 41* 45* 58*  CREATININE 3.97* 3.97* 3.88*  CALCIUM 8.0* 8.0* 8.0*  PHOS 4.2 4.6 5.8*   CBC Recent Labs  Lab 01/20/20 1400 01/22/20 1503 01/24/20 1610  WBC 7.6 7.4 8.2  HGB 8.5* 8.8* 9.2*  HCT 27.0* 29.3* 30.2*  MCV 96.1 96.4 96.2  PLT 123* 127* 138*    Medications:    . amiodarone  200 mg Oral Daily  . amLODipine  5 mg Oral Daily  . apixaban  5 mg Oral BID  . aspirin EC  81 mg Oral Daily  . atorvastatin  10 mg Oral Q M,W,F  . bisacodyl  10 mg Oral Daily  . blood pressure control book   Does not apply Once  . Chlorhexidine Gluconate Cloth  6 each Topical Q0600  . cholecalciferol  4,000 Units Oral Daily  . darbepoetin (ARANESP) injection - DIALYSIS  60 mcg Intravenous Q Thu-HD  . feeding supplement (NEPRO CARB STEADY)  237 mL Oral TID WC  . insulin aspart  0-9 Units Subcutaneous TID WC  . insulin glargine  8 Units Subcutaneous Daily  . lipase/protease/amylase  12,000 Units Oral TID AC  . living well with diabetes book   Does not apply Once  . magnesium chloride  2 tablet Oral BID  . mycophenolate  180 mg Oral BID  . pantoprazole  40 mg Oral Daily  . polyethylene glycol  17 g  Oral Daily  . predniSONE  5 mg Oral Q breakfast  . sertraline  50 mg Oral Daily  . tacrolimus  2 mg Oral BID  . tamsulosin  0.4 mg Oral QPC supper      Madelon Lips MD 01/26/2020, 3:22 PM

## 2020-01-26 NOTE — Progress Notes (Signed)
Occupational Therapy Weekly Progress Note  Patient Details  Name: Paul [name] MRN: [id number] Date of Birth: 1955/12/23  Beginning of progress report period: January 19, 2020 End of progress report period: January 26, 2020  Today's Date: 01/26/2020 OT Individual Time: 7829-5621 OT Individual Time Calculation (min): 72 min    Mr. Anzalone is making steady progress with OT at this time.  He currently is completing all UB selfcare in sitting with setup assist.  LB bathing is being performed sitting on the shower bench with min guard assist and lateral leans.  LB dressing is being performed in supine rolling in the bed at supervision as well.  He can complete anterior/posterior transfers from the bed to the wide drop arm commode with min guard assist.  He is able to complete sliding board transfers from wheelchair to the drop arm commode when he is not in the bed at min guard assist as well.  Now that his LLE prosthesis has been adjusted, he is able to use it more for transfers and is able to complete scoot pivot transfer to the drop arm commode at overall min assist level.  Feel that he is progressing well toward supervision level goals for home.  Discharge planned for 9/13 with family education set up on 9/10.  Will continue with current OT POC until discharge.     Patient continues to demonstrate the following deficits: muscle weakness and decreased sitting balance and decreased balance strategies and therefore will continue to benefit from skilled OT intervention to enhance overall performance with BADL and Reduce care partner burden.  Patient progressing toward long term goals..  Continue plan of care.  OT Short Term Goals Week 2:  OT Short Term Goal 1 (Week 2): Short term goals equal to LTGs set at overall supervision level based on ELOS.  Skilled Therapeutic Interventions/Progress Updates:    Pt completed wheelchair mobility down to the therapy gym with supervision.  He was able to complete  10 mins of BUE strengthening using the UE ergonometer.  Resistance level was set on level 10 with RPMs maintained above 20.  He was able to complete 5 mins forward and then 5 more peddling backwards with an approximate 2-3 minute rest break in between.  He was able to then work on toilet transfers to the wide drop arm commode with and without the prosthesis on the LLE.  He was able to complete anterior transfer onto the side of the drop arm and then turn to sit with min guard assist.  He was able to complete simulated clothing management in sitting with lateral leans and supervision using therapy band. He also completed scoot pivot transfer with the sliding board from the side with min guard.  Noted board was moving around on the drop arm surface with this transfer making it unsafe.  He also completed scoot pivot with min assist without the board as well, and emphasis on weight bearing through the LLE.  Finished session with transfer back to the room via wheelchair with supervision for rolling himself.  He did report having some light headedness while working in therapy, but BP was taken in the 140's over 60's range.  HR at 68 and O2 at 98%.  Pt left up in the wheelchair at conclusion of session with the call button and phone in reach and safety belt in place.     Therapy Documentation Precautions:  Precautions Precautions: Fall Precaution Comments: B BKA, no pillow under R knee Restrictions Weight Bearing  Restrictions: Yes RLE Weight Bearing: Non weight bearing  Pain: Pain Assessment Pain Scale: Faces Pain Score: 0-No pain ADL: See Care Tool Section for some details of mobility and selfcare  Therapy/Group: Individual Therapy  Remo Kirschenmann OTR/L 01/26/2020, 11:06 AM

## 2020-01-26 NOTE — Patient Care Conference (Signed)
Inpatient RehabilitationTeam Conference and Plan of Care Update Date: 01/26/2020   Time: 10:56 AM    Patient Name: Paul [name]      Medical Record Number: [id number]  Date of Birth: 09-15-1955 Sex: Male         Room/Bed: 4W03C/4W03C-01 Payor Info: Payor: CIGNA / Plan: CIGNA MANAGED / Product Type: *No Product type* /    Admit Date/Time:  01/18/2020  9:05 PM  Primary Diagnosis:  Right below-knee amputee [facility])  Hospital Problems: Principal Problem:   Right below-knee amputee [facility]) Active Problems:   ESRD on hemodialysis ([place])   Atrial fibrillation ([place])   Stage II pressure ulcer ([place])    Expected Discharge Date: Expected Discharge Date: 01/31/20  Team Members Present: Physician leading conference: Dr. Alysia Penna Care Coodinator Present: Dorien Chihuahua, RN, BSN, CRRN;Christina Sampson Goon, [place] Nurse Present: Dorthula Nettles, RN PT Present: Barrie Folk, PT OT Present: Clyda Greener, OT PPS Coordinator present : Ileana Ladd, Burna Mortimer, SLP     Current Status/Progress Goal Weekly Team Focus  Bowel/Bladder   can be incontinent of bowel & bladder, does use urinal, LBM 01/25/20  les episodes of incontinence  assist as needed & monitor   Swallow/Nutrition/ Hydration             ADL's   Supervision for UB selfcare and LB selfcare supine to sit.  He completes toilet transfers with drop arm commode via sliding board transfer with min guard assist.  Toileting tasks were completed with min guard as well.  supervision overall  selfcare retraining, transfer training, balance retraining, therapeutic exercise, pt education, therapeutic activites   Mobility   supervision bed mobility, CGA lateral scoot transfers via slide board, max assist sit<>stand in // bars, and supervision w/c management and propulsion  supervision overall at wheelchair level  B UE strengthening, B LE strengthening, sitting balance, transfer training, wheelchair propulsion and management, progression to  sit<>stands, prosthetic consultations, discharge planning, pt/family education   Communication             Safety/Cognition/ Behavioral Observations            Pain   complains of pain to his back & phantom pain to RLE intermittently, averages pain medication 1-2 times daily, no c/o pain tonight, has tylenol & oxy 5-10 prn  pain scale <3/10  assess & treat as needed   Skin   stage 2 pressure injury to the coccyx, some MASD & Right BKA with sutures  no signs of infection, no new areas of skin break down, healing to stump & coccyx wound  assess q shift     Discharge Planning:  Goal to discharge home spouse able to provide 24/7. 2 Level home (1 step into living room, ramp emtrance, walk in shower)   Team Discussion: Wound looks good, Nephrology to set up HD. Chronic back pain and skin issues addressed. Having prosthesis has improved transfers although he does well with lateral leans and AP transfers to/from the toilet.   Patient on target to meet rehab goals: yes  *See Care Plan and progress notes for long and short-term goals.   Revisions to Treatment Plan:   Teaching Needs: Transfers to car/van, toileting assistance, medications and skin care.  Current Barriers to Discharge: Home enviroment access/layout, Wound care and Transportation  Possible Resolutions to Barriers:  SCAT transport vs car/van transfers     Medical Summary Current Status: New dialysis, CBG ok  Barriers to Discharge: Medical stability;Hemodialysis   Possible Resolutions to [employer] Focus:  new HD, nephro arranging f/u , needs family ed prior to discharge   Continued Need for Acute Rehabilitation Level of Care: The patient requires daily medical management by a physician with specialized training in physical medicine and rehabilitation for the following reasons: Direction of a multidisciplinary physical rehabilitation program to maximize functional independence : Yes Medical management of patient  stability for increased activity during participation in an intensive rehabilitation regime.: Yes Analysis of laboratory values and/or radiology reports with any subsequent need for medication adjustment and/or medical intervention. : Yes   I attest that I was present, lead the team conference, and concur with the assessment and plan of the team.   Dorien Chihuahua B 01/26/2020, 4:31 PM

## 2020-01-26 NOTE — Progress Notes (Signed)
Physical Therapy Session Note  Patient Details  Name: Paul [name] MRN: [id number] Date of Birth: [birth year]  Today's Date: 01/26/2020  PT Individual Time: 6389-3734 and 2876-8115 PT Individual Time Calculation (min): 40 minand 71 min    Short Term Goals: Week 1:  PT Short Term Goal 1 (Week 1): STG=LTG based on ELOS  Skilled Therapeutic Interventions/Progress Updates:  session 1   Pt received sitting in WC and agreeable to PT. RN present to finish medication administration.  WC mobility through hall without cues or assist 2 x 124f, increased time noted for W[facility] Cent[facility]agement in tight spaces while exiting room and navigating WC to prepare for AP transfers.   Transfer training to mat table with AP technique and supervision assist from PT for improved safety to prevent posterior LOB and stabilize WC. Pt perform SB transfer with Prosthesis in place back to [facility] supervision assist and min cues for SB set up and tp improve head/hip relationship.  Sit<>stand from elevated mat height in parallel bars x 5 with mat height set between 25 and 22.5. min assist from PT throughout to block the L knee and improve anterior weight shift, as well as improve UE placement. To prevent posterior LOB.   Patient returned to room and left sitting in [facility] call [facility] in reach and all needs met.      Session 2.    Pt received sitting in WC and agreeable to PT. WC mobility through hall without assist from PT x [id number] [id number] BUE endurance/cardiovascular training.   Sit<>stand in parallel bars with prosthetist present to assess fit of prosthetic adjustments x 5. Min assist fading to mod assist due to LE fatigue, minor adjustments made to improve safety and success of transfers.   PT instructed pt LE therex with level 3 tband: LAQ, x 12, hip extension x 12, hip abduction x 12, and LLE knee flexion/extension AROM.   Car transfer training with SB performed to 29in and 27 in. Pt unable to maintain  andequate head/hip relation ship or pull through BUE on overhead bar to pull in to sitting. Fr m29 in. Min assist overall to 27 inches and moderate cues for improved LE placement, head/hip relationship, and proper UE placemen to allow increased gluteal clearance with SB.   NUstep BUE LLE endurance training x 8 min,  level 6-7, cues for consistent SPM to maximize cardiovascular training.  SB transfer to Nustep with set up assist and sqat pivot to return to WC w[facility] min assist for safety and cues for improve lift of gluteal surface in transfer.    Patient returned to room and left sitting in WC [facility] call bell in reach and all needs met.       Therapy Documentation Precautions:  Precautions Precautions: Fall Precaution Comments: B BKA, no pillow under R knee Restrictions Weight Bearing Restrictions: Yes RLE Weight Bearing: Non weight bearing Pain:   denies   Therapy/Group: Individual Therapy  AustLorie Phenix/2021, 8:49 AM

## 2020-01-26 NOTE — Progress Notes (Signed)
Renal Navigator appreciates update from CIR CSW reporting projected discharge date for patient from CIR of Monday 01/31/20. Navigator updated OP HD clinic that patient will plan to start in the clinic on Tuesday, 9/14 if discharged Monday, 9/13. Patient needs to arrive to his first appointment at [facility] at 10:30am on 9/14 in order to complete intake paperwork prior to first treatment 11:30am. Navigator received message from [place], who states she is working to ensure that patient's application will be processed prior to expected discharge date so that transportation will not delay discharge. Renal Navigator will continue to follow closely and will update patient once confirmation is given by [facility] [place] Coordinator.  Alphonzo Cruise, [place] Renal Navigator [phone]

## 2020-01-26 NOTE — Progress Notes (Addendum)
Patient ID: Paul [name], male   DOB: 1955-07-03, 64 y.o.   MRN: [id number]  Patient follow up [facility] referral sent to [facility] Will follow up with determination  [facility] unable to accept patient due to patient insurance not in network  [facility] has no PT and OT avaliable in area.

## 2020-01-27 ENCOUNTER — Inpatient Hospital Stay (HOSPITAL_COMMUNITY): Payer: Managed Care, Other (non HMO) | Admitting: Physical Therapy

## 2020-01-27 ENCOUNTER — Inpatient Hospital Stay (HOSPITAL_COMMUNITY): Payer: Managed Care, Other (non HMO) | Admitting: Occupational Therapy

## 2020-01-27 LAB — CBC
HCT: 26.2 % — ABNORMAL LOW (ref 39.0–52.0)
Hemoglobin: 8.2 g/dL — ABNORMAL LOW (ref 13.0–17.0)
MCH: 30.1 pg (ref 26.0–34.0)
MCHC: 31.3 g/dL (ref 30.0–36.0)
MCV: 96.3 fL (ref 80.0–100.0)
Platelets: 146 10*3/uL — ABNORMAL LOW (ref 150–400)
RBC: 2.72 MIL/uL — ABNORMAL LOW (ref 4.22–5.81)
RDW: 16 % — ABNORMAL HIGH (ref 11.5–15.5)
WBC: 6.2 10*3/uL (ref 4.0–10.5)
nRBC: 0 % (ref 0.0–0.2)

## 2020-01-27 LAB — GLUCOSE, CAPILLARY
Glucose-Capillary: 106 mg/dL — ABNORMAL HIGH (ref 70–99)
Glucose-Capillary: 186 mg/dL — ABNORMAL HIGH (ref 70–99)
Glucose-Capillary: 123 mg/dL — ABNORMAL HIGH (ref 70–99)
Glucose-Capillary: 183 mg/dL — ABNORMAL HIGH (ref 70–99)

## 2020-01-27 LAB — RENAL FUNCTION PANEL
Albumin: 2.2 g/dL — ABNORMAL LOW (ref 3.5–5.0)
Anion gap: 14 (ref 5–15)
BUN: 69 mg/dL — ABNORMAL HIGH (ref 8–23)
CO2: 18 mmol/L — ABNORMAL LOW (ref 22–32)
Calcium: 8 mg/dL — ABNORMAL LOW (ref 8.9–10.3)
Chloride: 102 mmol/L (ref 98–111)
Creatinine, Ser: 3.55 mg/dL — ABNORMAL HIGH (ref 0.61–1.24)
GFR calc Af Amer: 20 mL/min — ABNORMAL LOW (ref >60)
GFR calc non Af Amer: 17 mL/min — ABNORMAL LOW (ref >60)
Glucose, Bld: 177 mg/dL — ABNORMAL HIGH (ref 70–99)
Phosphorus: 5.6 mg/dL — ABNORMAL HIGH (ref 2.5–4.6)
Potassium: 4.7 mmol/L (ref 3.5–5.1)
Sodium: 134 mmol/L — ABNORMAL LOW (ref 135–145)

## 2020-01-27 MED ORDER — DARBEPOETIN ALFA 60 MCG/0.3ML IJ SOSY
PREFILLED_SYRINGE | INTRAMUSCULAR | Status: AC
  Administered 2020-01-27: 60 ug via INTRAVENOUS
  Filled 2020-01-27: qty 0.3

## 2020-01-27 NOTE — Plan of Care (Signed)
  Problem: RH Balance Goal: LTG Patient will maintain dynamic sitting balance (PT) Description: LTG:  Patient will maintain dynamic sitting balance with assistance during mobility activities (PT) Flowsheets (Taken 01/27/2020 1942) LTG: Pt will maintain dynamic sitting balance during mobility activities with:: (downgraded based on pt progress) Supervision/Verbal cueing Note: downgraded based on pt progress    Problem: RH Bed to Chair Transfers Goal: LTG Patient will perform bed/chair transfers w/assist (PT) Description: LTG: Patient will perform bed to chair transfers with assistance (PT). Flowsheets (Taken 01/27/2020 1942) LTG: Pt will perform Bed to Chair Transfers with assistance level: (downgraded based on pt progress) Supervision/Verbal cueing Note: downgraded based on pt progress   Problem: RH Furniture Transfers Goal: LTG Patient will perform furniture transfers w/assist (OT/PT) Description: LTG: Patient will perform furniture transfers  with assistance (OT/PT). Flowsheets (Taken 01/27/2020 1942) LTG: Pt will perform furniture transfers with assist:: (downgraded based on pt progress) Minimal Assistance - Patient > 75% Note: downgraded based on pt progress    Problem: Sit to Stand Goal: LTG:  Patient will perform sit to stand with assistance level (PT) Description: LTG:  Patient will perform sit to stand with assistance level (PT) 01/27/2020 1944 by [place] (Taken 01/27/2020 1944) LTG: PT will perform sit to stand in preparation for functional mobility with assistance level: (in // bars - upgraded base on pt progress) Moderate Assistance - Patient 50 - 74% Note:  upgraded base on pt progress

## 2020-01-27 NOTE — Progress Notes (Signed)
[place] PHYSICAL MEDICINE & REHABILITATION PROGRESS NOTE   Subjective/Complaints: No issues overnight, no longer taking oxycodone for pain ROS-    Pt denies SOB, abd pain, CP, N/V/D     Objective:   No results found. Recent Labs    01/24/20 1610  WBC 8.2  HGB 9.2*  HCT 30.2*  PLT 138*   Recent Labs    01/24/20 1610  NA 136  K 4.5  CL 100  CO2 23  GLUCOSE 209*  BUN 58*  CREATININE 3.88*  CALCIUM 8.0*    Intake/Output Summary (Last 24 hours) at 01/27/2020 0840 Last data filed at 01/27/2020 0700 Gross per 24 hour  Intake 476 ml  Output 650 ml  Net -174 ml     Physical Exam: Vital Signs Blood pressure (!) 149/63, pulse (!) 56, temperature 98.4 F (36.9 C), resp. rate 18, height 4' 10.5" (1.486 m), weight 68.2 kg, SpO2 99 %.     General: No acute distress Mood and affect are appropriate Heart: Regular rate and rhythm no rubs murmurs or extra sounds Lungs: Clear to auscultation, breathing unlabored, no rales or wheezes Abdomen: Positive bowel sounds, soft nontender to palpation, nondistended Extremities: No clubbing, cyanosis, or edema Skin: No evidence of breakdown, no evidence of rash    Neurologic: Cranial nerves II through XII intact, motor strength is 5/5 in bilateral deltoid, bicep, tricep, grip, hip flexor  Musculoskeletal:well healed Left BKA short residual, RIght BKA image below, mild tenderness right gluteus medius area   Left image 1 week ago, right image 9/6      Assessment/Plan: 1. Functional deficits secondary to Right BKA revision, hx Left BKA which require 3+ hours per day of interdisciplinary therapy in a comprehensive inpatient rehab setting.  Physiatrist is providing close team supervision and 24 hour management of active medical problems listed below.  Physiatrist and rehab team continue to assess barriers to discharge/monitor patient progress toward functional and medical goals  Care Tool:  Bathing    Body parts bathed  by patient: Right arm, Left arm, Chest, Abdomen, Front perineal area, Buttocks, Right upper leg, Left upper leg, Face     Body parts n/a: Right lower leg, Left lower leg   Bathing assist Assist Level: Contact Guard/Touching assist     Upper Body Dressing/Undressing Upper body dressing   What is the patient wearing?: Pull over shirt    Upper body assist Assist Level: Set up assist    Lower Body Dressing/Undressing Lower body dressing      What is the patient wearing?: Orthosis     Lower body assist Assist for lower body dressing: Set up assist     Toileting Toileting    Toileting assist Assist for toileting: Moderate Assistance - Patient 50 - 74% Assistive Device Comment: bedpan   Transfers Chair/bed transfer  Transfers assist     Chair/bed transfer assist level: Minimal Assistance - Patient > 75% (AP transfer)     Locomotion Ambulation   Ambulation assist   Ambulation activity did not occur: Safety/medical concerns (status as B BKA)          Walk 10 feet activity   Assist  Walk 10 feet activity did not occur: Safety/medical concerns (status as B BKA)        Walk 50 feet activity   Assist Walk 50 feet with 2 turns activity did not occur: Safety/medical concerns (status as B BKA)         Walk 150 feet activity   Assist  Walk 150 feet activity did not occur: Safety/medical concerns (status as B BKA)         Walk 10 feet on uneven surface  activity   Assist Walk 10 feet on uneven surfaces activity did not occur: Safety/medical concerns (status as B BKA)         Wheelchair     Assist Will patient use wheelchair at discharge?: Yes Type of Wheelchair: Manual    Wheelchair assist level: Supervision/Verbal cueing Max wheelchair distance: 111ft    Wheelchair 50 feet with 2 turns activity    Assist        Assist Level: Supervision/Verbal cueing   Wheelchair 150 feet activity     Assist      Assist Level:  Supervision/Verbal cueing   Blood pressure (!) 149/63, pulse (!) 56, temperature 98.4 F (36.9 C), resp. rate 18, height 4' 10.5" (1.486 m), weight 68.2 kg, SpO2 99 %.    Medical Problem List and Plan: 1.  Decreased functional ability secondary to cellulitis/osteomyelitis right BKA with history of bilateral BKA.  Status post right BKA revision 01/12/2020.  Wound VAC has been discontinued          Continue CIR PT OT,              -ELOS/Goals: tent d/c 9/13, patient is satisfied with discharge date 2.  Antithrombotics: -DVT/anticoagulation: Eliquis             -antiplatelet therapy: Aspirin 81 mg daily 3. Pain Management: Oxycodone will be discontinued as patient is no longer using this Right gluteus medius strain will order Sportscreme 4. Mood: Zoloft 50 mg daily.  Provide emotional support             -antipsychotic agents: N/A 5. Neuropsych: This patient is capable of making decisions on his own behalf. 6. Skin/Wound Care: Routine skin checks 7. Fluids/Electrolytes/Nutrition: Routine in and outs with follow-up chemistries 8.  Acute kidney injury on CKD stage IV with history of kidney transplant 2016.  Long-term dialysis now initiated 01/04/2020.  Outpatient dialysis set up for Tuesday Thursday Saturday at [facility] dialysis center.  Continue low-dose chronic prednisone, Myfortic 180 mg twice daily as well as Prograf 2 mg twice daily 9.  Chronic anemia.  Follow-up CBC.  Continue Aranesp 10.  Diabetes mellitus with peripheral neuropathy (on chronic prednisone).  Hemoglobin A1c CBG (last 3)  Recent Labs    01/26/20 1627 01/26/20 2051 01/27/20 0550  GLUCAP 128* 178* 106*  some lability but overall good control some pm elevation 11.  Atrial fibrillation.  Amiodarone as directed.  Continue Eliquis.  Cardiac rate controlled  9/5- rate controlled- con't regimen 12.  Hyperlipidemia.  Lipitor 13. Stage II sacral pressure ulcer- on admission- will need to con't foam dressing, keep pt off  backside unless eating or doing therapy- might benefit from air mattress if won't impair transfers.   9/5- in bedside chair  14.  History of constipation improved with laxatives 15. L BKA callus, pressure spot, follow-up orthotics/prosthetics, inner liner removed discused fluctuating limb edema, now that pt is on HD   LOS: 9 days A FACE TO FACE EVALUATION WAS PERFORMED  Charlett Blake 01/27/2020, 8:40 AM

## 2020-01-27 NOTE — Progress Notes (Addendum)
Renal Navigator received notification from [facility] [place] Eligibility Coordinator/C. Rorie stating that patient has been fully certified for transportation effective today. Navigator has updated CIR CSW and will ensure patient is aware. Rides can be scheduled 24 hours in advance by calling [phone].  Alphonzo Cruise, [place] Renal Navigator [phone]

## 2020-01-27 NOTE — Progress Notes (Signed)
Paul [name] Progress Note    Assessment/ Plan:   1. Acute kidney injury on CKD4, now progressed to ESRD: BL cr 2.8-3.4 s/p DDRT with DGF complicated by ACR and antibody mediated rejection requiring IVIG as recently as 2018.  Started dialysis 01/04/2020.  No sign of renal recovery therefore we will continue TTS schedule for dialysis. He has outpatient dialysis set up for TTS at [facility]- can start 9/14 as discharge is planned for 9/13. Patient has functional dialysis access.   2. Kidney transplant: Tapering IS- prednisone-tac tapered to 2/2 on 8/18->mycophenolate tapered to 180 mg twice daily on 8/22.  Last tacrolimus level was less than 1  12/31/2019.  Will continue to wean as OP   3. Hypertension/volume: some soft BPs and he reports some dizziness-  Will try to stop amlodipine  4. Secondary hyperparathyroidism/hyperphosphatemia: PTH level 43. Phosphorus level improving,off binders, monitor lab.  5. Anemia of CKD, acuteblood loss anemia:Received blood transfusion.Continue Aranesp  Iron saturation 81 Monitor hemoglobin and transfuse as needed.  6. Severe sepsis due to right leg osteomyelitis/abscess on BKA stump site:Status post revision of right below-knee amputation by Dr. Sharol Given on 8/25.   Transfered to [facility] for discharge on 9/13   Subjective:    No issues today.  Seen on HD- no c/o's    Objective:   BP 108/62 (BP Location: Left Arm)   Pulse 61   Temp 98 F (36.7 C) (Oral)   Resp 16   Ht 4' 10.5" (1.486 m)   Wt 69 kg   SpO2 99%   BMI 31.25 kg/m   Intake/Output Summary (Last 24 hours) at 01/27/2020 1529 Last data filed at 01/27/2020 0700 Gross per 24 hour  Intake 476 ml  Output 650 ml  Net -174 ml   Weight change: -2.2 kg  Physical Exam: Gen: NAD, sitting on HD CVS: RRR no m/r/g Resp: clear Abd: soft, NABS Ext: s/p bilateral BKA ACCESS: R RC AVF + T/B  Imaging: No results found.  Labs: BMET Recent Labs  Lab 01/22/20 1504  01/24/20 1610 01/27/20 1330  NA 137 136 134*  K 4.5 4.5 4.7  CL 104 100 102  CO2 22 23 18*  GLUCOSE 162* 209* 177*  BUN 45* 58* 69*  CREATININE 3.97* 3.88* 3.55*  CALCIUM 8.0* 8.0* 8.0*  PHOS 4.6 5.8* 5.6*   CBC Recent Labs  Lab 01/22/20 1503 01/24/20 1610 01/27/20 1230  WBC 7.4 8.2 6.2  HGB 8.8* 9.2* 8.2*  HCT 29.3* 30.2* 26.2*  MCV 96.4 96.2 96.3  PLT 127* 138* 146*    Medications:    . amiodarone  200 mg Oral Daily  . amLODipine  5 mg Oral Daily  . apixaban  5 mg Oral BID  . aspirin EC  81 mg Oral Daily  . atorvastatin  10 mg Oral Q M,W,F  . bisacodyl  10 mg Oral Daily  . Chlorhexidine Gluconate Cloth  6 each Topical Q0600  . cholecalciferol  4,000 Units Oral Daily  . darbepoetin (ARANESP) injection - DIALYSIS  60 mcg Intravenous Q Thu-HD  . feeding supplement (NEPRO CARB STEADY)  237 mL Oral TID WC  . insulin aspart  0-9 Units Subcutaneous TID WC  . insulin glargine  8 Units Subcutaneous Daily  . lipase/protease/amylase  12,000 Units Oral TID AC  . magnesium chloride  2 tablet Oral BID  . mycophenolate  180 mg Oral BID  . pantoprazole  40 mg Oral Daily  . polyethylene glycol  17 g  Oral Daily  . predniSONE  5 mg Oral Q breakfast  . sertraline  50 mg Oral Daily  . tacrolimus  2 mg Oral BID  . tamsulosin  0.4 mg Oral QPC supper      Paul [name]  01/27/2020, 3:29 PM

## 2020-01-27 NOTE — Progress Notes (Signed)
Occupational Therapy Session Note  Patient Details  Name: Paul [name] MRN: [id number] Date of Birth: 1955-07-28  Today's Date: 01/27/2020 OT Individual Time: 1105-1200 OT Individual Time Calculation (min): 55 min    Short Term Goals: Week 2:  OT Short Term Goal 1 (Week 2): Short term goals equal to LTGs set at overall supervision level based on ELOS.  Skilled Therapeutic Interventions/Progress Updates:    Pt completed on bathing and dressing to start the session.  He was able to transfer to the tub bench to start with min guard after second attempt with prosthesis in place.  Put dycem down on the bench to keep it from sliding as with first attempt board slid with pt and therapist had to provide max assist to keep him from falling as the board came off of the wheelchair.  Once on the seat, he was able to complete all bathing with setup.  He dried on the bench as well and completed donning his brief and shorts at setup level before transferring back to the wheelchair at min guard assist using the board and without the prosthesis.  He finished grooming and dressing from the wheelchair with setup and also donned his LLE prosthesis.  He then propelled himself down to the ADL kitchen where he worked on reaching to various heights from the wheelchair and performing cooking task, removing item from the refrigerator and placing on the counter as well as placing a pan in and out of the oven and the dishwasher.  All tasks were completed at supervision level.  Finished with return to the room and pt left sitting up in the wheelchair with the call button and phone in reach and safety belt in place.    Therapy Documentation Precautions:  Precautions Precautions: Fall Precaution Comments: B BKA, no pillow under R knee Restrictions Weight Bearing Restrictions: Yes RLE Weight Bearing: Non weight bearing  Pain: Pain Assessment Pain Scale: Faces Pain Score: 0-No pain ADL: See Care Tool Section for some  details of mobility and selfcare  Therapy/Group: Individual Therapy  Atha Mcbain OTR/L 01/27/2020, 12:36 PM

## 2020-01-27 NOTE — Procedures (Signed)
Patient was seen on dialysis and the procedure was supervised.  BFR 400  Via AVF BP is  108/62.   Patient appears to be tolerating treatment well  [name] 01/27/2020

## 2020-01-27 NOTE — Plan of Care (Signed)
  Problem: Sit to Stand Goal: LTG:  Patient will perform sit to stand in prep for activites of daily living with assistance level (OT) Description: LTG:  Patient will perform sit to stand in prep for activites of daily living with assistance level (OT) Outcome: Not Applicable Flowsheets (Taken 01/27/2020 1234) LTG: PT will perform sit to stand in prep for activites of daily living with assistance level: (goal discharged at this time as pt will not be standing with any selfcare tasks) -- Note: goal discharged at this time as pt will not be standing with any selfcare tasks

## 2020-01-27 NOTE — Progress Notes (Signed)
Physical Therapy Weekly Progress Note  Patient Details  Name: Paul [name] MRN: [id number] Date of Birth: 1955/07/05  Beginning of progress report period: January 19, 2020 End of progress report period: January 27, 2020  Today's Date: 01/27/2020 PT Individual Time: 0805-0902 and 1010-1103 PT Individual Time Calculation (min): 57 min and 53 min  Patient has met 0 of 1 short term goals due to none being created based on original ELOS. Paul [name] is progressing well with therapy demonstrating increasing strength, endurance, and trunk control. He is performing supine<>sit with supervision, lateral scoot transfers using transfer board to level surfaces with CGA but mod assist to unlevel surfaces with continued cuing for proper set-up of transfers. He has progressed to sit<>stands in // bars with mod assist now that his L LE prosthesis was shortened allowing closer center of mass over his base of support. He is performing B UE w/c propulsion and w/c part management with distant supervision and demoing improved propulsion technique.  Patient continues to demonstrate the following deficits muscle weakness and muscle joint tightness, decreased cardiorespiratoy endurance, unbalanced muscle activation, decreased awareness, decreased problem solving and delayed processing and decreased sitting balance, decreased standing balance, decreased postural control and decreased balance strategies and therefore will continue to benefit from skilled PT intervention to increase functional independence with mobility.  Patient not progressing toward long term goals.  See goal revision. Certain goals downgraded to supervision for improved pt safety. Continue plan of care.  PT Short Term Goals Week 1:  PT Short Term Goal 1 (Week 1): STG=LTG based on ELOS PT Short Term Goal 1 - Progress (Week 1): Progressing toward goal Week 2:  PT Short Term Goal 1 (Week 2): = to LTGs based on ELOS  Skilled Therapeutic  Interventions/Progress Updates:  Ambulation/gait training;DME/adaptive equipment instruction;UE/LE Strength taining/ROM;Psychosocial support;Balance/vestibular training;Functional electrical stimulation;Skin care/wound management;UE/LE Coordination activities;Functional mobility training;Splinting/orthotics;Community reintegration;Neuromuscular re-education;Stair training;Wheelchair propulsion/positioning;Discharge planning;Pain management;Therapeutic Activities;Disease management/prevention;Patient/family education;Therapeutic Exercise;Cognitive remediation/compensation   Session 1: Pt received sitting in w/c and agreeable to therapy session. Discussed discharge plans with recommendation of follow-up HHPT - pt in agreement. B UE w/c propulsion ~133f to main therapy gym with distant supervision - pt demoing improving w/c propulsion technique with improved grasp on wheel bar and improved release to decrease friction resulting in decreased push strokes to achieve this distance and in less time. R lateral scoot transfer w/c>EOM using transfer board with min cuing for proper set-up and close supervision for safety - demos improved ability to use L LE to start lifting hips during transfer now that prosthetic has been shortened. Performed 1 set of the following LE exercises targeting strengthening and ROM:  - Seated Long Arc Quad - 1 x daily - 7 x weekly - 2 sets - 20 reps - Sidelying Hip Abduction (BKA) - 1 x daily - 7 x weekly - 2 sets - 15 reps - Prone Hip Extension with Residual Limb (BKA) - 1 x daily - 7 x weekly - 2 sets - 15 reps - Prone Lying with Towel Roll (Hip Flexor Stretch) (BKA) - 1 x daily - 7 x weekly - 2 sets - 2 minute hold - Supine Active Straight Leg Raise - 1 x daily - 7 x weekly - 2 sets - 15 reps - Supine Hip Extension with Footstool (BKA) - 1 x daily - 7 x weekly - 2 sets - 15 reps - Prone Knee Flexion - 1 x daily - 7 x weekly - 2 sets - 15 reps - Supine  Heel Slide - 1 x daily - 7 x  weekly - 3 sets - 15 reps Therapist providing cuing throughout for proper form/technique and education on which exercise to perform modified versions for R LE vs L LE. Provided patient with written HEP with those directions. L lateral scoot EOM>w/c using transfer board with close supervision for safety - demos decreased ability to get L foot on floor to assist with lifting hips during this transfer. B UE w/c propulsion ~124f back to room with pt again continuing to demo improving propulsion technique. Pt left seated in w/c with needs in reach.  Session 2: Pt received sitting in w/c and agreeable to therapy session. B UE w/c propulsion ~2565fto ortho gym with supervision and pt continuing to demo improving wheelchair propulsion technique with decreased push strokes to achieve longer distances. Discussed car transfer technique to 29inch seat height (lowest vehicle) using transfer board - required mod assist for scooting uphill into the vehicle (pt unable to reach ground with shortened L LE, don't believe pt would have been able to reach the ground with the longer prosthetic either), min assist for scooting hips downhill out of the car for increased control and safety - planning to perform real car transfer tomorrow during hands-on family education/training session. Discussed having patient's wife bring in pt's wheelchair to practice with as well. B UE w/c propulsion ~1515f2 up/down ramp with close supervision for safety and pt demoing ability to get wheelchair up ramp with cuing for hand placement on wheels. Squat pivot/lateral scoot transfers x2 w/c<>recliner seat with min assist going downhill into recliner and mod assist for lifting and pivoting hips to transfer back up into wheelchair seat (demos increasing ability to use L LE to lift hips during transfer with shorter prosthetic but will need continued strengthening and practice) - discussed having pt continue addressing this at home with follow-up therapist  prior to performing with family. Sit<>stands x4 in // bars to/from w/c with heavy min assist/light mod assist for lifting into standing - pt demos significantly improved ability to come to standing with shorter prosthetic - cuing for increased L glute/quad muscle activation for improved upright posture and cuing for hand placement to improved stance posture (continues to demo need for strengthening of his scapular depressors) - able stand up to 2mi77mes at once. Removed L LE prosthetic to assess the L lateral "knot/bump" on residual limb with no indication of pressure to that area during standing and pt denies any pain or pinching. Transported back to room in w/c and left seated with needs in reach.   Therapy Documentation Precautions:  Precautions Precautions: Fall Precaution Comments: B BKA, no pillow under R knee Restrictions Weight Bearing Restrictions: Yes RLE Weight Bearing: Non weight bearing  Pain:   Session 1: Reports some "pulling" pain on R residual limb during sidelying hip abduction exercise but reports it goes away after and denies pain remainder of session.  Session 2: Denies pain during session.  Therapy/Group: Individual Therapy  CarlTawana ScaleT, DPT, CSRS  01/27/2020, 7:42 AM

## 2020-01-27 NOTE — Progress Notes (Signed)
°   01/27/20 1649  Vital Signs  Temp 98.3 F (36.8 C)  Temp Source Oral  Pulse Rate (!) 56  Pulse Rate Source Monitor  BP 126/71  BP Location Left Arm  BP Method Automatic  Patient Position (if appropriate) Lying  Oxygen Therapy  SpO2 98 %  O2 Device Room Air  Dialysis Weight  Weight 65.8 kg  Type of Weight Post-Dialysis  During Hemodialysis Assessment  Intra-Hemodialysis Comments Tx completed  Post-Hemodialysis Assessment  Rinseback Volume (mL) 250 mL  KECN 298 V  Dialyzer Clearance Lightly streaked  Duration of HD Treatment -hour(s) 3.5 hour(s)  Hemodialysis Intake (mL) 500 mL  UF Total -Machine (mL) 3000 mL  Net UF (mL) 2500 mL  Tolerated HD Treatment Yes  Post-Hemodialysis Comments tx complete-pt stable  AVG/AVF Arterial Site Held (minutes) 5 minutes  AVG/AVF Venous Site Held (minutes) 5 minutes  Fistula / Graft Right Forearm Arteriovenous fistula  Placement Date/Time: 05/22/12 0831   Placed prior to admission: No  Orientation: Right  Access Location: Forearm  Access Type: Arteriovenous fistula  Expiration Date: 03/21/15  Site Condition No complications  Fistula / Graft Assessment Present;Thrill;Bruit  Status Deaccessed;Flushed  Drainage Description None

## 2020-01-28 ENCOUNTER — Inpatient Hospital Stay (HOSPITAL_COMMUNITY): Payer: Medicare Other | Admitting: Occupational Therapy

## 2020-01-28 ENCOUNTER — Ambulatory Visit: Payer: Managed Care, Other (non HMO)

## 2020-01-28 ENCOUNTER — Inpatient Hospital Stay (HOSPITAL_COMMUNITY): Payer: Managed Care, Other (non HMO)

## 2020-01-28 ENCOUNTER — Ambulatory Visit (HOSPITAL_COMMUNITY): Payer: Managed Care, Other (non HMO) | Admitting: Physical Therapy

## 2020-01-28 ENCOUNTER — Inpatient Hospital Stay (HOSPITAL_COMMUNITY): Payer: Managed Care, Other (non HMO) | Admitting: Physical Therapy

## 2020-01-28 LAB — GLUCOSE, CAPILLARY
Glucose-Capillary: 99 mg/dL (ref 70–99)
Glucose-Capillary: 178 mg/dL — ABNORMAL HIGH (ref 70–99)
Glucose-Capillary: 225 mg/dL — ABNORMAL HIGH (ref 70–99)
Glucose-Capillary: 139 mg/dL — ABNORMAL HIGH (ref 70–99)

## 2020-01-28 NOTE — Progress Notes (Signed)
Renal Navigator met with patient and wife at bedside in CIR to notify that [facility] [place] transportation has been confirmed. Navigator made transportation arrangements for OP HD for Tuesday, 9/14 and Thursday, 9/16 and has called patient's wife with pick up times. Patient and wife appreciative.  Alphonzo Cruise,  Renal Navigator [phone]

## 2020-01-28 NOTE — Progress Notes (Signed)
Occupational Therapy Session Note  Patient Details  Name: Paul [name] MRN: [id number] Date of Birth: Feb 08, 1956  Today's Date: 01/28/2020 OT Individual Time: 0909-1000 OT Individual Time Calculation (min): 51 min    Short Term Goals: Week 2:  OT Short Term Goal 1 (Week 2): Short term goals equal to LTGs set at overall supervision level based on ELOS.  Skilled Therapeutic Interventions/Progress Updates:    Pt's spouse in for education during session.  He was able to complete simulated toilet transfers with use of the sliding board with supervision as well as anterior/posterior transfer to the side of the drop arm without his prosthesis.  He completed simulated clothing management as well with supervision using lateral leans side to side.  Tub bench transfer was completed with sliding board as well and use of Dycem to keep the board from moving on the plastic surface of the bench, while transferring onto it.  He was able to complete this with supervision as well.  Finished session with pt re-wrapping his RLE with the ace bandage.  Provided educational handout on BUE therex as well for shoulder flexion, horizontal abduction as well as elbow flexion and extension.  He was able to return demonstrate shoulder flexion and elbow flexion this session for 1 set of 10 reps.  He has been able to complete all of these in prior sessions.  PT left in the wheelchair at end of session waiting for PT to come in.    Therapy Documentation Precautions:  Precautions Precautions: Fall Precaution Comments: B BKA, no pillow under R knee Restrictions Weight Bearing Restrictions: Yes RLE Weight Bearing: Non weight bearing  Pain:  No report of pain ADL: See Care Tool Section for some details of mobility and selfcare  Therapy/Group: Individual Therapy  Oleta Gunnoe OTR/L 01/28/2020, 4:14 PM

## 2020-01-28 NOTE — Progress Notes (Signed)
Physical Therapy Session Note  Patient Details  Name: Paul [name] MRN: [id number] Date of Birth: 11-Nov-1955  Today's Date: 01/28/2020 PT Individual Time: 8118-8677 and 3736-6815 PT Individual Time Calculation (min): 55 min and 30 min   Short Term Goals: Week 1:  PT Short Term Goal 1 (Week 1): STG=LTG based on ELOS PT Short Term Goal 1 - Progress (Week 1): Progressing toward goal Week 2:  PT Short Term Goal 1 (Week 2): = to LTGs based on ELOS  Skilled Therapeutic Interventions/Progress Updates:  Session 1  Pt received sitting in WC and agreeable to PT. Pt's wife present for family education. Pt performed WC mobility in various environments through hall of hospital as well as over cement side walk x 262f, 3049f and 18016fNo cues or assist throughout WC [facility] on level surface and only min cues for use of WC gloves to control speed on sidewalk.   Pt performed car transfer to minivan an entrance to hospital x 2 with SB and min assist from PT. Min-mod cues for positioning as well as problem solving to improve safety and technique to transition into car, including proper LLE placement, awareness of door frame and SB management. Supervision assist to SB transfer back to [facility] cues for proper placement of SB and to utilize LLE on ground to  Control speed of descent. Sit<>stand in parallel bars x 3 with min assist overall from PT with LLE blocked. Pt able to maintain standing x 15 sec each bout.   AP transfer to bed with supervision assist at 26inches to simulate home set up; min cues for WC placement and NWB in the RLE with AP transfers into bed. Pt  left supine in bed with call bell in reach and all needs met.   Session 2. .  Pt received sitting in WC and agreeable to PT PT treatment focused on functional WC mobility in simulated community environment of cement sidewalk at [facility] prop[facility] PT [id number] downhill and uphil grades with distant supervision assist from PT  with cues for anterior lean to prevent posterior LOB when going up hill. Patient returned to room and left [facility] in reach and all needs met.       Therapy Documentation Precautions:  Precautions Precautions: Fall Precaution Comments: B BKA, no pillow under R knee Restrictions Weight Bearing Restrictions: Yes RLE Weight Bearing: Non weight bearing Pain: denies   Therapy/Group: Individual Therapy  AustiLorie Phenix/2021, 11:04 AM

## 2020-01-28 NOTE — Progress Notes (Signed)
Physical Therapy Session Note  Patient Details  Name: Paul [name] MRN: [id number] Date of Birth: Aug 01, 1955  Today's Date: 01/28/2020 PT Individual Time: 5615-3794 PT Individual Time Calculation (min): 51 min   Short Term Goals: Week 1:  PT Short Term Goal 1 (Week 1): STG=LTG based on ELOS PT Short Term Goal 1 - Progress (Week 1): Progressing toward goal Week 2:  PT Short Term Goal 1 (Week 2): = to LTGs based on ELOS Week 3:     Skilled Therapeutic Interventions/Progress Updates:    PAIN Denies pain  Pt initially on commode, therapist returned later in day for session. Pt oob in wc and prepared for later session.  wc propulsion mod I to day room gym. wc to mat transfer level surgfaces w/supervision lateral scoot.    Seated LAQs RLE w/emphasis on maximizing extension x 20 Sit to supine to prone independently on mat  In prone performed the following: Knee flexion 2x20 bilat Hip extesnion 2x20 bilat SL hip abd x 20 bilat SL hip exts x 20 bilat SL hip add RLE x 20  Seated push up block shoulder depression 2x12  Seated ball toss for dynamic balance and UE strength/endurance 1 min x 2  Mat to wc w/set up/supervision lateral scoot.  wc propulsion > 346ft on level surface mod I Ascends/descends ramp x 2 reps w/supervision. wc propulsion >363ft mod I  Pt left oob in wc w/ needs in reach.         Therapy Documentation Precautions:  Precautions Precautions: Fall Precaution Comments: B BKA, no pillow under R knee Restrictions Weight Bearing Restrictions: Yes RLE Weight Bearing: Non weight bearing    Therapy/Group: Individual Therapy  Callie Fielding, [place] 01/28/2020, 3:57 PM

## 2020-01-28 NOTE — Progress Notes (Signed)
Occupational Therapy Discharge Summary  Patient Details  Name: Paul [name] MRN: [id number] Date of Birth: 30-Mar-1956  Today's Date: 01/28/2020 OT Individual Time: 0909-1000 OT Individual Time Calculation (min): 51 min    Patient has met 11 of 11 long term goals due to improved activity tolerance, improved balance and ability to compensate for deficits.  Patient to discharge at overall Supervision level.  Patient's care partner is independent to provide the necessary physical assistance at discharge.    Reasons goals not met: NA  Recommendation:  Patient will benefit from ongoing skilled OT services in home health setting to continue to advance functional skills in the area of BADL, iADL and Reduce care partner burden.  Pt will continue to benefit from [facility] to further progress ADL and IADL function to a modified independent level as well as for increasing BUE strength for functional use.  Currently, he still needs close supervision for all functional transfers to the wheelchair, tub bench, and drop arm commode.    Equipment: No equipment provided  Reasons for discharge: treatment goals met and discharge from hospital  Patient/family agrees with progress made and goals achieved: Yes  OT Discharge Precautions/Restrictions  Precautions Precautions: Fall Precaution Comments: B BKA, no pillow under R knee Restrictions Weight Bearing Restrictions: Yes RLE Weight Bearing: Non weight bearing    ADL ADL Eating: Independent Where Assessed-Eating: Wheelchair Grooming: Independent Where Assessed-Grooming: Wheelchair Upper Body Bathing: Supervision/safety Where Assessed-Upper Body Bathing: Shower Lower Body Bathing: Supervision/safety Where Assessed-Lower Body Bathing: Shower Upper Body Dressing: Independent Where Assessed-Upper Body Dressing: Wheelchair Lower Body Dressing: Setup Where Assessed-Lower Body Dressing: Bed level Toileting: Supervision/safety Where Assessed-Toileting:  Bedside Commode Toilet Transfer: Close supervision Toilet Transfer Method: Transfer board, Other (comment) (anterior/posterior as well as sliding transfer with use of the board) Toilet Transfer Equipment: Drop arm bedside commode Tub/Shower Transfer: Not assessed Film/video editor: Close supervision Film/video editor Method: Best boy: Sales promotion account executive Baseline Vision/History: Wears glasses Wears Glasses: Reading only Patient Visual Report: No change from baseline Vision Assessment?: No apparent visual deficits Perception  Perception: Within Functional Limits Praxis Praxis: Intact Cognition Overall Cognitive Status: Within Functional Limits for tasks assessed Arousal/Alertness: Awake/alert Attention: Selective Selective Attention: Appears intact Memory: Appears intact Problem Solving: Appears intact Safety/Judgment: Appears intact Sensation Sensation Light Touch: Appears Intact Hot/Cold: Appears Intact Proprioception: Appears Intact Stereognosis: Appears Intact Additional Comments: sensation intact in BUEs Coordination Gross Motor Movements are Fluid and Coordinated: Yes Fine Motor Movements are Fluid and Coordinated: Yes Motor  Motor Motor: Within Functional Limits Motor - Discharge Observations: Still with some generailzed weakness but improving overall. Mobility  Bed Mobility Bed Mobility: Sit to Supine;Supine to Sit Supine to Sit: Supervision/Verbal cueing Sit to Supine: Supervision/Verbal cueing  Trunk/Postural Assessment  Cervical Assessment Cervical Assessment: Within Functional Limits Thoracic Assessment Thoracic Assessment: Within Functional Limits Lumbar Assessment Lumbar Assessment: Exceptions to [facility] (posterior pelvic tilt)  Balance Balance Balance Assessed: Yes Static Sitting Balance Static Sitting - Balance Support: Right upper extremity supported;Left upper extremity supported Static Sitting -  Level of Assistance: 7: Independent Dynamic Sitting Balance Dynamic Sitting - Balance Support: During functional activity Dynamic Sitting - Level of Assistance: 5: Stand by assistance Extremity/Trunk Assessment RUE Assessment RUE Assessment: Exceptions to [facility] Active Range of Motion (AROM) Comments: WFLS throughout General Strength Comments: shoulder flexion 3+/5, elbow flexion/extension, 4/5, grip 4/5 LUE Assessment LUE Assessment: Exceptions to [facility] Active Range of Motion (AROM) Comments: AROM WFLS for all joints General Strength Comments:  shoulder flexion 3+/5, elbow flexion/extension 4/5, grip 3+/5.  Decreased strength in digits noted when attempting to open the toothpaste, needing assist from therapist.   Mackinsey Pelland OTR/L 01/28/2020, 5:31 PM

## 2020-01-28 NOTE — Progress Notes (Signed)
[place] PHYSICAL MEDICINE & REHABILITATION PROGRESS NOTE   Subjective/Complaints: No issues overnight, pain is well controlled, having good bowel movements, tolerating hemodialysis ROS-    Pt denies SOB, abd pain, CP, N/V/D     Objective:   No results found. Recent Labs    01/27/20 1230  WBC 6.2  HGB 8.2*  HCT 26.2*  PLT 146*   Recent Labs    01/27/20 1330  NA 134*  K 4.7  CL 102  CO2 18*  GLUCOSE 177*  BUN 69*  CREATININE 3.55*  CALCIUM 8.0*    Intake/Output Summary (Last 24 hours) at 01/28/2020 0847 Last data filed at 01/28/2020 0749 Gross per 24 hour  Intake 360 ml  Output 2725 ml  Net -2365 ml     Physical Exam: Vital Signs Blood pressure (!) 144/61, pulse 68, temperature 98.3 F (36.8 C), temperature source Oral, resp. rate 17, height 4' 10.5" (1.486 m), weight 66.9 kg, SpO2 99 %.   General: No acute distress Mood and affect are appropriate Heart: Regular rate and rhythm no rubs murmurs or extra sounds Lungs: Clear to auscultation, breathing unlabored, no rales or wheezes Abdomen: Positive bowel sounds, soft nontender to palpation, nondistended Extremities: No clubbing, cyanosis, or edema Skin: No evidence of breakdown, no evidence of rash    Neurologic: Cranial nerves II through XII intact, motor strength is 5/5 in bilateral deltoid, bicep, tricep, grip, hip flexor  Musculoskeletal:well healed Left BKA short residual, RIght BKA image below, mild tenderness right gluteus medius area   Left image 1 week ago, right image 9/6      Assessment/Plan: 1. Functional deficits secondary to Right BKA revision, hx Left BKA which require 3+ hours per day of interdisciplinary therapy in a comprehensive inpatient rehab setting.  Physiatrist is providing close team supervision and 24 hour management of active medical problems listed below.  Physiatrist and rehab team continue to assess barriers to discharge/monitor patient progress toward functional  and medical goals  Care Tool:  Bathing    Body parts bathed by patient: Right arm, Left arm, Chest, Abdomen, Front perineal area, Buttocks, Right upper leg, Left upper leg, Face     Body parts n/a: Right lower leg, Left lower leg   Bathing assist Assist Level: Contact Guard/Touching assist     Upper Body Dressing/Undressing Upper body dressing   What is the patient wearing?: Pull over shirt    Upper body assist Assist Level: Supervision/Verbal cueing    Lower Body Dressing/Undressing Lower body dressing      What is the patient wearing?: Pants     Lower body assist Assist for lower body dressing: Set up assist     Toileting Toileting    Toileting assist Assist for toileting: Moderate Assistance - Patient 50 - 74% Assistive Device Comment: bedpan   Transfers Chair/bed transfer  Transfers assist     Chair/bed transfer assist level: Contact Guard/Touching assist (slide board)     Locomotion Ambulation   Ambulation assist   Ambulation activity did not occur: Safety/medical concerns (status as B BKA)          Walk 10 feet activity   Assist  Walk 10 feet activity did not occur: Safety/medical concerns (status as B BKA)        Walk 50 feet activity   Assist Walk 50 feet with 2 turns activity did not occur: Safety/medical concerns (status as B BKA)         Walk 150 feet activity   Assist  Walk 150 feet activity did not occur: Safety/medical concerns (status as B BKA)         Walk 10 feet on uneven surface  activity   Assist Walk 10 feet on uneven surfaces activity did not occur: Safety/medical concerns (status as B BKA)         Wheelchair     Assist Will patient use wheelchair at discharge?: Yes Type of Wheelchair: Manual    Wheelchair assist level: Supervision/Verbal cueing Max wheelchair distance: 149ft    Wheelchair 50 feet with 2 turns activity    Assist        Assist Level: Supervision/Verbal cueing    Wheelchair 150 feet activity     Assist      Assist Level: Supervision/Verbal cueing   Blood pressure (!) 144/61, pulse 68, temperature 98.3 F (36.8 C), temperature source Oral, resp. rate 17, height 4' 10.5" (1.486 m), weight 66.9 kg, SpO2 99 %.    Medical Problem List and Plan: 1.  Decreased functional ability secondary to cellulitis/osteomyelitis right BKA with history of bilateral BKA.  Status post right BKA revision 01/12/2020.  Wound VAC has been discontinued          Continue CIR PT OT,              -ELOS/Goals: tent d/c 9/13, patient is satisfied with discharge date 2.  Antithrombotics: -DVT/anticoagulation: Eliquis             -antiplatelet therapy: Aspirin 81 mg daily 3. Pain Management: Oxycodone will be discontinued as patient is no longer using this Right gluteus medius strain will order Sportscreme 4. Mood: Zoloft 50 mg daily.  Provide emotional support             -antipsychotic agents: N/A 5. Neuropsych: This patient is capable of making decisions on his own behalf. 6. Skin/Wound Care: Routine skin checks 7. Fluids/Electrolytes/Nutrition: Routine in and outs with follow-up chemistries 8.  Acute kidney injury on CKD stage IV with history of kidney transplant 2016.  Long-term dialysis now initiated 01/04/2020.  Outpatient dialysis set up for Tuesday Thursday Saturday at [facility] dialysis center.  Continue low-dose chronic prednisone, Myfortic 180 mg twice daily as well as Prograf 2 mg twice daily 9.  Chronic anemia.  Follow-up CBC.  Continue Aranesp 10.  Diabetes mellitus with peripheral neuropathy (on chronic prednisone).  Hemoglobin A1c CBG (last 3)  Recent Labs    01/27/20 1740 01/27/20 2059 01/28/20 0614  GLUCAP 123* 183* 99  some lability but overall good control some pm elevation 11.  Atrial fibrillation.  Amiodarone as directed.  Continue Eliquis.  Cardiac rate controlled  9/5- rate controlled- con't regimen 12.  Hyperlipidemia.  Lipitor 13. Stage  II sacral pressure ulcer- on admission- will need to con't foam dressing, keep pt off backside unless eating or doing therapy- might benefit from air mattress if won't impair transfers.   9/5- in bedside chair  14.  History of constipation improved with laxatives 15. L BKA callus, pressure spot, follow-up orthotics/prosthetics, inner liner removed discused fluctuating limb edema, now that pt is on HD   LOS: 10 days A FACE TO FACE EVALUATION WAS PERFORMED  Charlett Blake 01/28/2020, 8:47 AM

## 2020-01-28 NOTE — Progress Notes (Signed)
Patient ID: Paul [name], male   DOB: 19-Feb-1956, 64 y.o.   MRN: [id number]   Sw attempted to schedule patient's ride for [facility], upon scheduling sw was informed patient is currently inactive. SW reached out to [place] at [facility] to potentially get this information switched over. SW will keep everyone updated if ride is unable to be scheduled. Sw has also informed Colleen with Nephrology, due to her being informed that patient was confirmed with [facility].  [place], [place]

## 2020-01-28 NOTE — Progress Notes (Signed)
[name] Progress Note    Assessment/ Plan:   1. Acute kidney injury on CKD4, now progressed to ESRD: BL cr 2.8-3.4 s/p DDRT with DGF complicated by ACR and antibody mediated rejection requiring IVIG as recently as 2018.  Started dialysis 01/04/2020.  No sign of renal recovery therefore we will continue TTS schedule for dialysis. He has outpatient dialysis set up for TTS at [facility]- can start 9/14 as discharge is planned for 9/13. Patient has functional dialysis access ([facility]).   2. Kidney transplant: Tapering IS- prednisone-tac tapered to 2/2 on 8/18->mycophenolate tapered to 180 mg twice daily on 8/22.  Last tacrolimus level was less than 1  12/31/2019.  Will continue to wean as OP.   3. Hypertension/volume: some soft BPs and he reports some dizziness.  Off amlodipine.  4. Secondary hyperparathyroidism/hyperphosphatemia: PTH level 43. Phosphorus level improving,off binders, monitor lab.  5. Anemia of CKD, acuteblood loss anemia:Received blood transfusion.Continue Aranesp  Iron saturation 81 Monitor hemoglobin and transfuse as needed.  6. Severe sepsis due to right leg osteomyelitis/abscess on BKA stump site:Status post revision of right below-knee amputation by Dr. Sharol Given on 8/25.   Transfered to [facility] for discharge on 9/13   Subjective:    Working with therapies.     Objective:   BP (!) 125/56 (BP Location: Left Arm)   Pulse 64   Temp 98.5 F (36.9 C)   Resp 18   Ht 4' 10.5" (1.486 m)   Wt 66.9 kg   SpO2 97%   BMI 30.30 kg/m   Intake/Output Summary (Last 24 hours) at 01/28/2020 1538 Last data filed at 01/28/2020 1429 Gross per 24 hour  Intake 600 ml  Output 2725 ml  Net -2125 ml   Weight change: 0.8 kg  Physical Exam: Gen: NAD, sitting on HD CVS: RRR no m/r/g Resp: clear Abd: soft, NABS Ext: s/p bilateral BKA ACCESS: R RC AVF + T/B  Imaging: No results found.  Labs: BMET Recent Labs  Lab 01/22/20 1504 01/24/20 1610  01/27/20 1330  NA 137 136 134*  K 4.5 4.5 4.7  CL 104 100 102  CO2 22 23 18*  GLUCOSE 162* 209* 177*  BUN 45* 58* 69*  CREATININE 3.97* 3.88* 3.55*  CALCIUM 8.0* 8.0* 8.0*  PHOS 4.6 5.8* 5.6*   CBC Recent Labs  Lab 01/22/20 1503 01/24/20 1610 01/27/20 1230  WBC 7.4 8.2 6.2  HGB 8.8* 9.2* 8.2*  HCT 29.3* 30.2* 26.2*  MCV 96.4 96.2 96.3  PLT 127* 138* 146*    Medications:    . amiodarone  200 mg Oral Daily  . apixaban  5 mg Oral BID  . aspirin EC  81 mg Oral Daily  . atorvastatin  10 mg Oral Q M,W,F  . bisacodyl  10 mg Oral Daily  . Chlorhexidine Gluconate Cloth  6 each Topical Q0600  . cholecalciferol  4,000 Units Oral Daily  . darbepoetin (ARANESP) injection - DIALYSIS  60 mcg Intravenous Q Thu-HD  . feeding supplement (NEPRO CARB STEADY)  237 mL Oral TID WC  . insulin aspart  0-9 Units Subcutaneous TID WC  . insulin glargine  8 Units Subcutaneous Daily  . lipase/protease/amylase  12,000 Units Oral TID AC  . magnesium chloride  2 tablet Oral BID  . mycophenolate  180 mg Oral BID  . pantoprazole  40 mg Oral Daily  . polyethylene glycol  17 g Oral Daily  . predniSONE  5 mg Oral Q breakfast  . sertraline  50 mg Oral Daily  . tacrolimus  2 mg Oral BID  . tamsulosin  0.4 mg Oral QPC supper      Colter Magowan  01/28/2020, 3:38 PM

## 2020-01-28 NOTE — Progress Notes (Signed)
Patient ID: Paul [name], male   DOB: Jan 19, 1956, 64 y.o.   MRN: [id number]   Right amputee pad ordered through [facility]!  Nunez, [place]

## 2020-01-28 NOTE — Progress Notes (Signed)
Patient ID: Paul [name], male   DOB: [birth year], 64 y.o.   MRN: [id number]  Patient [facility] discharge transportation set for Monday, September 13th between 10:30AM-11:00AM

## 2020-01-29 ENCOUNTER — Inpatient Hospital Stay (HOSPITAL_COMMUNITY): Payer: Managed Care, Other (non HMO)

## 2020-01-29 DIAGNOSIS — Z992 Dependence on renal dialysis: Secondary | ICD-10-CM

## 2020-01-29 DIAGNOSIS — Z89512 Acquired absence of left leg below knee: Secondary | ICD-10-CM

## 2020-01-29 DIAGNOSIS — I480 Paroxysmal atrial fibrillation: Secondary | ICD-10-CM

## 2020-01-29 DIAGNOSIS — N186 End stage renal disease: Secondary | ICD-10-CM

## 2020-01-29 DIAGNOSIS — D638 Anemia in other chronic diseases classified elsewhere: Secondary | ICD-10-CM

## 2020-01-29 DIAGNOSIS — R7309 Other abnormal glucose: Secondary | ICD-10-CM

## 2020-01-29 DIAGNOSIS — E1142 Type 2 diabetes mellitus with diabetic polyneuropathy: Secondary | ICD-10-CM

## 2020-01-29 LAB — GLUCOSE, CAPILLARY
Glucose-Capillary: 119 mg/dL — ABNORMAL HIGH (ref 70–99)
Glucose-Capillary: 221 mg/dL — ABNORMAL HIGH (ref 70–99)
Glucose-Capillary: 251 mg/dL — ABNORMAL HIGH (ref 70–99)

## 2020-01-29 NOTE — Discharge Summary (Signed)
Physician Discharge Summary  Patient ID: JANARD CULP MRN: [id number] DOB/AGE: 66-Aug-1957 66 y.o.  Admit date: 01/18/2020 Discharge date: 01/31/2020  Discharge Diagnoses:  Principal Problem:   Right below-knee amputee [facility]) Active Problems:   ESRD on hemodialysis ([place])   Atrial fibrillation (HCC)   Stage II pressure ulcer ([place])   Labile blood glucose   Diabetic peripheral neuropathy (HCC)   Anemia of chronic disease Hyperlipidemia Stage II sacral pressure ulcer History of constipation Left BKA  Discharged Condition: Stable  Significant Diagnostic Studies: DG Chest 1 View  Result Date: 01/04/2020 CLINICAL DATA:  Pulmonary edema EXAM: CHEST  1 VIEW COMPARISON:  10/12/2019 FINDINGS: The heart size and mediastinal contours are within normal limits. Both lungs are clear. The visualized skeletal structures are unremarkable. IMPRESSION: No active disease. Electronically Signed   By: Ulyses Jarred M.D.   On: 01/04/2020 01:21   US Renal Transplant w/Doppler  Result Date: 12/31/2019 CLINICAL DATA:  Acute renal failure. History of a renal transplantation. EXAM: ULTRASOUND OF RENAL TRANSPLANT WITH RENAL DOPPLER ULTRASOUND TECHNIQUE: Ultrasound examination of the renal transplant was performed with gray-scale, color and duplex doppler evaluation. COMPARISON:  CT, 04/23/2019. FINDINGS: Transplant kidney location: RLQ Transplant Kidney: Renal measurements: 11.1 x 4.3 x 5.2 cm = volume: 130.90mL. Normal in size and parenchymal echogenicity. No evidence of mass or hydronephrosis. Trace perinephric fluid adjacent to the lower pole. Color flow in the main renal artery:  Yes Color flow in the main renal vein:  Yes Duplex Doppler Evaluation: Main Renal Artery Resistive Index: 0.87. Velocity, peak systolic, 440.3 centimeters/second. Venous waveform in main renal vein:  Present, 19 centimeters/second Intrarenal resistive index in upper pole:  0.74 (normal 0.6-0.8; equivocal 0.8-0.9; abnormal >= 0.9) Intrarenal  resistive index in lower pole: 0.97 (normal 0.6-0.8; equivocal 0.8-0.9; abnormal >= 0.9) Bladder: Some debris.  No wall thickening, stone or mass. Other findings:  None. IMPRESSION: 1. Abnormally elevated resistive index in the lower pole of the transplant kidney with loss of the diastolic waveform. Resistive index measures 0.97. 2. Trace perinephric fluid adjacent to the lower pole. 3. No other abnormality. Electronically Signed   By: Lajean Manes M.D.   On: 12/31/2019 13:59   MR TIBIA FIBULA RIGHT WO CONTRAST  Result Date: 01/04/2020 CLINICAL DATA:  Possible osteomyelitis. EXAM: MRI OF LOWER RIGHT EXTREMITY WITHOUT CONTRAST TECHNIQUE: Multiplanar MR images of the remaining tibia/fibula were attempted. The has a below the knee amputation. There were cooperation issues and patient motion related issues because of this the distal stump and part of the tibia and fibula were not included. The technologist reports that this was the best exam the could be achieved due to cooperation related issues. COMPARISON:  CT scan 12/31/2019 FINDINGS: Despite efforts by the technologist and patient, motion artifact is present on today's exam and could not be eliminated. This reduces exam sensitivity and specificity. Bones/Joint/Cartilage No findings of osteomyelitis in the distal femur, in the patella, or in the included proximal 6 cm of the tibia. Due to cooperation related issues, we did not visualize about 2 cm of the distal most tibia or the overlying stump. A knee effusion is present. No compelling degree of synovitis to suggest that this is from septic joint, and no abnormal marrow signal along the joint surfaces. Ligaments The anterior and posterior cruciate ligaments appear intact. Muscles and Tendons No unexpected findings. Soft tissues Mild diffuse subcutaneous edema. IMPRESSION: 1. No findings of osteomyelitis in the distal femur, patella, or in the included proximal 6 cm  of the tibia. Please note that the patient  cooperation related issues, the distal 2 cm of the tibia/fibula, and the overlying stump, were not included in imaging. This includes the hyperdense collection previously seen along the stump which was thought to potentially be a hematoma on the prior CT from 12/31/2019. 2. No drainable abscess. 3. Small knee effusion. I am skeptical of septic joint given the lack of synovitis. 4. Despite efforts by the technologist and patient, motion artifact is present on today's exam and could not be eliminated. This reduces exam sensitivity and specificity. Electronically Signed   By: Van Clines M.D.   On: 01/04/2020 20:16   CT EXTREMITY LOWER RIGHT WO CONTRAST  Result Date: 12/31/2019 CLINICAL DATA:  Pain and swelling at right BKA stump EXAM: CT OF THE LOWER RIGHT EXTREMITY WITHOUT CONTRAST TECHNIQUE: Multidetector CT imaging of the right lower extremity was performed according to the standard protocol. COMPARISON:  None available FINDINGS: Bones/Joint/Cartilage Patient is status post below-knee amputation of the right lower extremity. Subtle loss of the cortical definition of the distal amputation margin of the tibia (series 9, image 47; series 7, image 82). The residual fibula appears intact with preservation of the distal cortex. The patella and visualized distal femur appear intact. Bones are demineralized. No acute fracture. No dislocation. Joint spaces are relatively preserved. Small nonspecific knee joint effusion. Ligaments Suboptimally assessed by CT. Collateral ligaments appear grossly intact. Muscles and Tendons No acute musculotendinous abnormality. Soft tissues Swelling and edema at the distal stump with possible cutaneous blister (series 8, image 74). There is a more focal slightly hyperdense collection at the posterolateral aspect of the distal stump measuring approximately 2.6 x 1.7 x 2.6 cm (series 4, image 54), which could represent a hematoma. No soft tissue gas. Extensive vascular  calcifications. IMPRESSION: 1. Status post below-knee amputation of the right lower extremity. Subtle loss of the cortical definition of the distal amputation margin of the tibia, raises the suspicion for early acute osteomyelitis. 2. Swelling and edema at the distal stump with possible cutaneous blister. There is a more focal hyperdense collection at the posterolateral aspect of the distal stump measuring approximately 2.6 x 1.7 x 2.6 cm, which could represent a hematoma. 3. Small nonspecific knee joint effusion. Electronically Signed   By: Davina Poke D.O.   On: 12/31/2019 13:23    Labs:  Basic Metabolic Panel: Recent Labs  Lab 01/24/20 1610 01/27/20 1330  NA 136 134*  K 4.5 4.7  CL 100 102  CO2 23 18*  GLUCOSE 209* 177*  BUN 58* 69*  CREATININE 3.88* 3.55*  CALCIUM 8.0* 8.0*  PHOS 5.8* 5.6*    CBC: Recent Labs  Lab 01/24/20 1610 01/27/20 1230  WBC 8.2 6.2  HGB 9.2* 8.2*  HCT 30.2* 26.2*  MCV 96.2 96.3  PLT 138* 146*    CBG: Recent Labs  Lab 01/28/20 1703 01/28/20 2113 01/29/20 0556 01/29/20 1134 01/29/20 1624  GLUCAP 225* 139* 119* 109* 98*   Family history mother with cancer of unknown Father prostate cancer maternal grandfather with CAD paternal grandmother with CVA.  Denies any colon cancer esophageal cancer or rectal cancer  Brief HPI:   Paul [name] is a 64 y.o. right-handed male with history of renal transplant 2016 with baseline creatinine 2.8-3.4 maintained on chronic prednisone Prograf as well as Myfortic, hypertension, diabetes mellitus, atrial fibrillation on Eliquis, bilateral below-knee amputations receiving inpatient rehab services 08/03/2013 to 08/15/2013 as well as 05/31/2013 to 06/11/2013 for multifactorial gait disorder  related to peripheral neuropathy.  Lives with spouse two-level home bed and bath main level ramped entrance.  Independent with assistive device and prosthesis provided by [employer].  Presented 12/30/2019 with nonspecific pain  and rash at the end of right leg since 3 days prior.  Admission chemistries glucose 269 BUN 80 creatinine 4.76 hemoglobin 10.4 WBC 13,300 lactic acid 0.5 SARS coronavirus negative urinalysis negative nitrite CK 56.  CT of right lower extremity showed swelling edema at the distal stump with possible cutaneous blister.  There was more focal hyperdense collection at the posterior lateral aspect of the distal stump measuring approximately 2.6 x 1.7 x 2.6 cm possibly representing hematoma.  MRI of right lower extremity showed no findings of osteomyelitis no drainable abscess.  Renal ultrasound showed no abscess or hydronephrosis.  Placed on vancomycin as well as cefepime for questionable early cellulitis.  Hospital course bouts of atrial fibrillation RVR 120s to 130 blood pressure is 90/60 received fluid bolus follow-up cardiology services placed on IV amiodarone transition to p.o. as well as digoxin.  Follow-up renal services for progressive CKD stage IV necessitating need for dialysis initiated 01/04/2020 and arrangements made for ongoing outpatient dialysis on discharge with latest creatinine 4.14.  Follow-up Dr. Sharol Given for cellulitis suspect osteomyelitis right below-knee amputation had no change with conservative care underwent revision right BKA 25 2021 initial p patient was admitted for a comprehensive rehab program lacement of wound VAC that was later discontinued.   Hospital Course: TIRAS BIANCHINI was admitted to rehab 01/18/2020 for inpatient therapies to consist of PT, ST and OT at least three hours five days a week. Past admission physiatrist, therapy team and rehab RN have worked together to provide customized collaborative inpatient rehab.  In regards to patient's right BKA history of bilateral BKA revision of BKA 01/12/2020 wound VAC discontinued he would follow Dr. Sharol Given.  He remained on Eliquis as well as aspirin no bleeding episodes cardiac rate controlled.  Pain managed with use of oxycodone.  Mood  stabilization with Zoloft he was attending full therapies.  Acute kidney disease on CKD stage IV history of renal transplant 2016 patient now dialysis arranged at [facility] dialysis center follow-up per renal services.  Chronic anemia continue Aranesp no bleeding episodes.  Blood sugars overall controlled and patient continued Lantus insulin with diabetic teaching.  Heart rate monitored amiodarone as directed as well as Eliquis.  Lipitor for hyperlipidemia.  Patient with history of left BKA callus pressure spot follow-up orthotics prosthetics interlining removed discussed fluctuating limb edema.  Foam dressing to stage II sacral ulcer keep patient off Maxide less eating or doing therapy.   Blood pressures were monitored on TID basis and controlled  Diabetes has been monitored with ac/hs CBG checks and SSI was use prn for tighter BS control.    Rehab course: During patient's stay in rehab weekly team conferences were held to monitor patient's progress, set goals and discuss barriers to discharge. At admission, patient required minimal assist lateral scoot transfers minimal guard sit to side-lying moderate assist side-lying to sit.  Set up upper body bathing mod assist lower body bathing minimal assist upper body dressing moderate assist lower body dressing  Physical exam.  Blood pressure 154/66 pulse 64 temperature 98.4 respirations 14 oxygen saturation 90% room air Constitutional.  No acute distress HEENT Head.  Normocephalic and atraumatic Eyes.  Pupils round and reactive to light no discharge.nystagmus Neck.  Supple nontender no JVD without thyromegaly Cardiac regular rate rhythm without extra sounds  or murmur heard Abdomen.  Soft nontender positive bowel sounds without rebound Respiratory effort normal no respiratory distress without wheeze Musculoskeletal.  Right BKA wrapped in Ace wrap appropriately tender.  Upper extremities 5/5 deltoids biceps triceps wrist extension grip bilateral finger  abduction 4 -/5 bilateral tested twice.  Left lower extremity hip flexion knee extension knee flexion 5/5 release 3 out of 5 right lower extremity BKA Left BKA well-healed Stage II on sacrum oval ellipsoid shape 2 to 2.5 inches in diameter biggest top layer of skin off    He/  has had improvement in activity tolerance, balance, postural control as well as ability to compensate for deficits. He/ has had improvement in functional use RUE/LUE  and RLE/LLE as well as improvement in awareness.  Lateral scoot transfers wheelchair to mat to the right supervision.  Don and doff prosthesis while on mat independent.  Propelled wheelchair with distant supervision.  He completed simulated toilet transfers with use of sliding board supervision as well as anterior posterior transfers to the side to drop arm commode without prosthesis.  He completed simulated clothing management as well as supervision using lateral lean side to side.  Tub bench transfer completed with sliding board as well as use of daycem to keep the board from moving on the plastic surface.  Full family teaching completed plan discharged home       Disposition: Discharged home    Diet: Modified  Special Instructions: No driving smoking or alcohol  Continue hemodialysis as directed  Medications at discharge 1.  Tylenol as needed 2.  Amiodarone 200 mg daily 3.  Eliquis 5 mg p.o. twice daily 4.  Aspirin 81 mg p.o. daily 5.  Lipitor 10 mg Monday Wednesday Friday 6.  Dulcolax tablet daily 7.  Vitamin D3 4000 units p.o. daily 8.  Aranesp weekly with hemodialysis 9.  Lantus insulin 8 units daily 10.  Creon 12,000 units p.o. 3 times daily before meals 11.  Magnesium chloride 128 mg p.o. twice daily 12.  Myfortic 180 mg p.o. twice daily 13.  Protonix 40 mg p.o. daily 14.  MiraLAX daily 15.  Prednisone 5 mg p.o. daily 16.  Zoloft 50 mg p.o. daily 17.  Prograf 2 mg p.o. twice daily 18.  Flomax 0.4 mg p.o. after supper  30-35  minutes were spent completing discharge summary and discharge planning    Follow-up Information    Kirsteins, Luanna Salk, MD Follow up.   Specialty: Physical Medicine and Rehabilitation Why: NO follow up needed Contact information: [place] [address] [phone]        Newt Minion, MD Follow up.   Specialty: Orthopedic Surgery Why: Call for appointment Contact information: [place] [address] [phone]        Geralynn Rile, MD Follow up.   Specialties: Internal Medicine, Cardiology, Radiology Why: Call for appointment Contact information: [place] [address] [phone]        Jamal Maes, MD Follow up.   Specialty: Nephrology Why: Call for appointment Contact information: [place] [address] [phone]               Signed: Cathlyn Parsons 01/29/2020, 5:48 PM

## 2020-01-29 NOTE — Progress Notes (Addendum)
Physical Therapy Discharge Summary  Patient Details  Name: Paul [name] MRN: [id number] Date of Birth: 12/30/1955  Today's Date: 01/30/2020 PT Individual Time: 0830-0923 PT Individual Time Calculation (min): 53 min   Patient has met 8 of 9 long term goals due to improved activity tolerance, improved balance, improved postural control, increased strength, improved awareness and improved coordination. Patient to discharge at a wheelchair level Supervision/Mod I for WC mobility. Patient's care partner is independent to provide the necessary physical assistance at discharge. Pt's wife attended family education training on 9/10 and verbalized/demonstrated confidence with transfers (specifically car transfers) to ensure safe discharge home.   Reasons goals not met: Pt did not meet car transfer goal at supervision and currently requires min A to transfer into minivan and supervision to transfer out using sideboard due to decreased balance/postural control, bilateral BKA, generalized weakness, and due to increased height of vehicle.   Recommendation:  Patient will benefit from ongoing skilled PT services in home health setting to continue to advance safe functional mobility, address ongoing impairments in transfers, generalized strengthening, limb loss education, dynamic sitting/standing balance/coordination, endurance, and to minimize fall risk.  Equipment: No equipment provided  Reasons for discharge: treatment goals met  Patient/family agrees with progress made and goals achieved: Yes  Today's Interventions: Received pt sitting in [place], pt agreeable to therapy, and denied any pain during session but reported feeling nauseous (RN aware and provided medication prior to session). Pt stated he did not get much sleep last night due to dialysis schedule and required maximal rest breaks during session due to increased fatigue. L LE prosthesis donned during session. Session with emphasis on discharge  planning, functional mobility/transfers, generalized strengthening, dynamic sitting balance/coordination, and improved activity tolerance. Pt performed WC mobility 115ft using bilateral UEs mod I and transported to therapy gym in [facility] total A remainder of way due to UE fatigue. Pt politely declined standing in // bars due to nausea/fatigue and declined performing car transfer in rehab car after already practicing in actual vehicle. Pt able to perform WC parts management independently and set up transfer to mat independently. Pt transferred WC<>mat via lateral scoot with supervision. Pt performed the following exercises sitting on mat with supervision and verbal cues for technique: -overhead shoulder press with 4lb dowel 2x12 -horizontal chest press with 4lb dowel 2x10 -hip flexion 2x10 bilaterally -knee extension 2x10 bilaterally  -trunk rotations with 4.4lb medicine ball x10 bilaterally -single arm bicep curls with 6lb dumbbell x10 bilaterally   -pronation/supination "driving the car" with bilateral shoulder extension with 4lb dumbbell 2 x 6 Worked on dynamic sitting balance, trunk control, and core stabilization tossing horseshoes x 3 trials with supervision. Pt transferred mat<>WC via lateral scoot with supervision and transported back to room in [facility] total A. Pt requested to return to bed but agreeable to stay in [facility] until sheets changed. Concluded session with pt sitting in [facility] with all needs within reach.   PT Discharge Precautions/Restrictions Precautions Precautions: Fall Precaution Comments: B BKA, no pillow under R knee Restrictions Weight Bearing Restrictions: Yes RLE Weight Bearing: Non weight bearing Cognition Overall Cognitive Status: Within Functional Limits for tasks assessed Arousal/Alertness: Awake/alert Orientation Level: Oriented X4 Memory: Appears intact Awareness: Appears intact Problem Solving: Appears intact Safety/Judgment: Appears intact Sensation Sensation Light  Touch: Appears Intact Proprioception: Impaired by gross assessment Additional Comments: impaired proprioception due to bilateral BKAs Coordination Gross Motor Movements are Fluid and Coordinated: No Fine Motor Movements are Fluid and Coordinated: Yes Coordination and Movement Description:  grossly uncoordinated due to bilateral BKA, decreased balance/postural control and generalized weakness Finger Nose Finger Test: [facility] bilaterally Heel Shin Test: decreased due to bilateral BKA, but hip ROM WNL Motor  Motor Motor: Abnormal postural alignment and control Motor - Skilled Clinical Observations: grossly uncoordinated due to bilateral BKA, decreased balance/postural control, and generalized weakness  Mobility Bed Mobility Bed Mobility: Rolling Right;Rolling Left;Sit to Supine;Supine to Sit Rolling Right: Independent Rolling Left: Independent Supine to Sit: Independent Sit to Supine: Independent Transfers Transfers: Sit to Stand;Stand to Sit;Lateral/Scoot Transfers Sit to Stand: Minimal Assistance - Patient > 75% (in parallel bars) Stand to Sit: Contact Guard/Touching assist Anterior-Posterior Transfer: Supervision/Verbal cueing Lateral/Scoot Transfers: Supervision/Verbal cueing Transfer (Assistive device): None (lateral scoot) Locomotion  Gait Ambulation: No Gait Gait: No Stairs / Additional Locomotion Stairs: No Architect: Yes Wheelchair Assistance: Independent with Camera operator: Both upper extremities Wheelchair Parts Management: Independent Distance: >338ft  Trunk/Postural Assessment  Cervical Assessment Cervical Assessment: Within Functional Limits Thoracic Assessment Thoracic Assessment: Exceptions to [facility] (mild kyphosis) Lumbar Assessment Lumbar Assessment: Exceptions to [facility] (posterior pelvic tilt) Postural Control Postural Control: Deficits on evaluation  Balance Balance Balance Assessed: Yes Static Sitting  Balance Static Sitting - Balance Support: Bilateral upper extremity supported;Feet supported Static Sitting - Level of Assistance: 7: Independent Dynamic Sitting Balance Dynamic Sitting - Balance Support: Bilateral upper extremity supported;Feet supported Dynamic Sitting - Level of Assistance: 7: Independent Static Standing Balance Static Standing - Balance Support: Bilateral upper extremity supported (parallel bars) Static Standing - Level of Assistance: 4: Min assist Extremity Assessment  RLE Assessment RLE Assessment: Exceptions to [facility] General Strength Comments: grossly generalized to 4-/5 (hip flexion, knee extension, hip abd/add) LLE Assessment LLE Assessment: Exceptions to [facility] General Strength Comments: grossly generalized to 4-/5 (hip flexion, knee extension, hip abd/add)   Alfonse Alpers PT, DPT  01/29/2020, 3:14 PM

## 2020-01-29 NOTE — Progress Notes (Signed)
[facility] Progress Note    Assessment/ Plan:   1. Acute kidney injury on CKD4, now progressed to ESRD: BL cr 2.8-3.4 s/p DDRT with DGF complicated by ACR and antibody mediated rejection requiring IVIG as recently as 2018.  Started dialysis 01/04/2020.  No sign of renal recovery therefore we will continue TTS schedule for dialysis. He has outpatient dialysis set up for TTS at [facility]- can start 9/14 as discharge is planned for 9/13. Patient has functional dialysis access (AVF).   2. Kidney transplant: Tapering IS- prednisone-tac tapered to 2/2 on 8/18->mycophenolate tapered to 180 mg twice daily on 8/22.  Last tacrolimus level was less than 1  12/31/2019.  Will continue to wean as OP.   3. Hypertension/volume: some soft BPs and he reports some dizziness.  Off amlodipine.  4. Secondary hyperparathyroidism/hyperphosphatemia: PTH level 43. Phosphorus level improving,off binders, monitor lab.  5. Anemia of CKD, acuteblood loss anemia:Received blood transfusion.Continue Aranesp 60 q Thursday--> increase to 100 for next rx  Iron saturation 81 Monitor hemoglobin and transfuse as needed.  6. Severe sepsis due to right leg osteomyelitis/abscess on BKA stump site:Status post revision of right below-knee amputation by Dr. Sharol Given on 8/25.   Transfered to [place] [facility] for discharge on 9/13   Subjective:    For HD today.   Objective:   BP (!) 143/63 (BP Location: Left Arm)   Pulse (!) 56   Temp 97.9 F (36.6 C) (Oral)   Resp 16   Ht 4' 10.5" (1.486 m)   Wt 66.4 kg   SpO2 98%   BMI 30.07 kg/m   Intake/Output Summary (Last 24 hours) at 01/29/2020 1227 Last data filed at 01/29/2020 0830 Gross per 24 hour  Intake 870 ml  Output 500 ml  Net 370 ml   Weight change: -2.6 kg  Physical Exam: Gen: NAD, sitting on HD CVS: RRR no m/r/g Resp: clear Abd: soft, NABS Ext: s/p bilateral BKA ACCESS: R RC AVF + T/B  Imaging: No results found.  Labs: BMET Recent  Labs  Lab 01/22/20 1504 01/24/20 1610 01/27/20 1330  NA 137 136 134*  K 4.5 4.5 4.7  CL 104 100 102  CO2 22 23 18*  GLUCOSE 162* 209* 177*  BUN 45* 58* 69*  CREATININE 3.97* 3.88* 3.55*  CALCIUM 8.0* 8.0* 8.0*  PHOS 4.6 5.8* 5.6*   CBC Recent Labs  Lab 01/22/20 1503 01/24/20 1610 01/27/20 1230  WBC 7.4 8.2 6.2  HGB 8.8* 9.2* 8.2*  HCT 29.3* 30.2* 26.2*  MCV 96.4 96.2 96.3  PLT 127* 138* 146*    Medications:    . amiodarone  200 mg Oral Daily  . apixaban  5 mg Oral BID  . aspirin EC  81 mg Oral Daily  . atorvastatin  10 mg Oral Q M,W,F  . bisacodyl  10 mg Oral Daily  . Chlorhexidine Gluconate Cloth  6 each Topical Q0600  . cholecalciferol  4,000 Units Oral Daily  . darbepoetin (ARANESP) injection - DIALYSIS  60 mcg Intravenous Q Thu-HD  . feeding supplement (NEPRO CARB STEADY)  237 mL Oral TID WC  . insulin aspart  0-9 Units Subcutaneous TID WC  . insulin glargine  8 Units Subcutaneous Daily  . lipase/protease/amylase  12,000 Units Oral TID AC  . magnesium chloride  2 tablet Oral BID  . mycophenolate  180 mg Oral BID  . pantoprazole  40 mg Oral Daily  . polyethylene glycol  17 g Oral Daily  . predniSONE  5 mg Oral Q breakfast  . sertraline  50 mg Oral Daily  . tacrolimus  2 mg Oral BID  . tamsulosin  0.4 mg Oral QPC supper      Madelon Lips  01/29/2020, 12:27 PM

## 2020-01-29 NOTE — Progress Notes (Signed)
[facility]ICINE [facility] REHABILITATION PROGRESS NOTE   Subjective/Complaints: Patient seen laying in bed this morning. He states he slept well overnight. He was seen by nephrology yesterday, notes reviewed-continue current plan.  ROS: Denies SOB, abd pain, CP, N/V/D   Objective:   No results found. Recent Labs    01/27/20 1230  WBC 6.2  HGB 8.2*  HCT 26.2*  PLT 146*   Recent Labs    01/27/20 1330  NA 134*  K 4.7  CL 102  CO2 18*  GLUCOSE 177*  BUN 69*  CREATININE 3.55*  CALCIUM 8.0*    Intake/Output Summary (Last 24 hours) at 01/29/2020 0812 Last data filed at 01/29/2020 0634 Gross per 24 hour  Intake 540 ml  Output 500 ml  Net 40 ml     Physical Exam: Vital Signs Blood pressure (!) 143/63, pulse (!) 56, temperature 97.9 F (36.6 C), temperature source Oral, resp. rate 16, height 4' 10.5" (1.486 m), weight 66.4 kg, SpO2 98 %. Constitutional: No distress . Vital signs reviewed. HENT: Normocephalic.  Atraumatic. Eyes: EOMI. No discharge. Cardiovascular: No JVD.  RRR. Respiratory: Normal effort.  No stridor.  Bilateral clear to auscultation. GI: Non-distended.  BS +. Skin: Right BKA with dressing CDI Psych: Normal mood.  Normal behavior. Musc: Left BKA healed Right BKA Neuro: Alert Motor: Bilateral upper extremities: 5/5 proximal distal Bilateral lower extremities: Hip flexion 5/5  Assessment/Plan: 1. Functional deficits secondary to Right BKA revision, hx Left BKA which require 3+ hours per day of interdisciplinary therapy in a comprehensive inpatient rehab setting.  Physiatrist is providing close team supervision and 24 hour management of active medical problems listed below.  Physiatrist and rehab team continue to assess barriers to discharge/monitor patient progress toward functional and medical goals  Care Tool:  Bathing    Body parts bathed by patient: Right arm, Left arm, Chest, Abdomen, Front perineal area, Buttocks, Right upper leg,  Left upper leg, Face     Body parts n/a: Right lower leg, Left lower leg   Bathing assist Assist Level: Contact Guard/Touching assist     Upper Body Dressing/Undressing Upper body dressing   What is the patient wearing?: Pull over shirt    Upper body assist Assist Level: Supervision/Verbal cueing    Lower Body Dressing/Undressing Lower body dressing      What is the patient wearing?: Pants     Lower body assist Assist for lower body dressing: Set up assist     Toileting Toileting    Toileting assist Assist for toileting: Supervision/Verbal cueing Assistive Device Comment: bedpan   Transfers Chair/bed transfer  Transfers assist     Chair/bed transfer assist level: Supervision/Verbal cueing     Locomotion Ambulation   Ambulation assist   Ambulation activity did not occur: Safety/medical concerns (status as B BKA)          Walk 10 feet activity   Assist  Walk 10 feet activity did not occur: Safety/medical concerns (status as B BKA)        Walk 50 feet activity   Assist Walk 50 feet with 2 turns activity did not occur: Safety/medical concerns (status as B BKA)         Walk 150 feet activity   Assist Walk 150 feet activity did not occur: Safety/medical concerns (status as B BKA)         Walk 10 feet on uneven surface  activity   Assist Walk 10 feet on uneven surfaces activity did not occur:  Safety/medical concerns (status as B BKA)         Wheelchair     Assist Will patient use wheelchair at discharge?: Yes Type of Wheelchair: Manual    Wheelchair assist level: Independent Max wheelchair distance: >300    Wheelchair 50 feet with 2 turns activity    Assist        Assist Level: Independent   Wheelchair 150 feet activity     Assist      Assist Level: Independent   Blood pressure (!) 143/63, pulse (!) 56, temperature 97.9 F (36.6 C), temperature source Oral, resp. rate 16, height 4' 10.5" (1.486 m),  weight 66.4 kg, SpO2 98 %.    Medical Problem List and Plan: 1.  Decreased functional ability secondary to cellulitis/osteomyelitis right BKA with history of bilateral BKA.  Status post right BKA revision 01/12/2020.  Wound VAC has been discontinued  Continue CIR 2.  Antithrombotics: -DVT/anticoagulation: Eliquis             -antiplatelet therapy: Aspirin 81 mg daily 3. Pain Management: Oxycodone discontinued  Right gluteus medius strain, ordered Sportscreme 4. Mood: Zoloft 50 mg daily.  Provide emotional support             -antipsychotic agents: N/A 5. Neuropsych: This patient is capable of making decisions on his own behalf. 6. Skin/Wound Care: Routine skin checks 7. Fluids/Electrolytes/Nutrition: Routine in and outs 8.  Acute kidney injury on CKD stage IV with history of kidney transplant 2016.  Long-term dialysis now initiated 01/04/2020.  Outpatient dialysis set up for Tuesday Thursday Saturday at [facility] dialysis center.  Continue low-dose chronic prednisone, Myfortic 180 mg twice daily as well as Prograf 2 mg twice daily  Appreciate nephro recs 9.  Chronic anemia.  Continue Aranesp  Hemoglobin 8.2 on 9/9, labs today with HD 10.  Diabetes mellitus with peripheral neuropathy (on chronic prednisone).  Hemoglobin A1c CBG (last 3)  Recent Labs    01/28/20 1703 01/28/20 2113 01/29/20 0556  GLUCAP 225* 139* 119*   Labile on 9/11, monitor for trend 11.  Atrial fibrillation.  Amiodarone as directed.  Continue Eliquis.  Cardiac rate controlled  Controlled on 9/11 12.  Hyperlipidemia.  Lipitor 13. Stage II sacral pressure ulcer- on admission- will need to con't foam dressing, keep pt off backside unless eating or doing therapy- might benefit from air mattress if won't impair transfers.  14.  History of constipation improved with laxatives  Improved 15. L BKA callus, pressure spot, follow-up orthotics/prosthetics, inner liner removed discused fluctuating limb edema, now that pt is  on HD  LOS: 11 days A FACE TO FACE EVALUATION WAS PERFORMED  Uchenna Rappaport Lorie Phenix 01/29/2020, 8:12 AM

## 2020-01-29 NOTE — Plan of Care (Signed)
°  Problem: Consults Goal: RH GENERAL PATIENT EDUCATION Description: See Patient Education module for education specifics. Outcome: Progressing Goal: Skin Care Protocol Initiated - if Braden Score 18 or less Description: If consults are not indicated, leave blank or document N/A Outcome: Progressing Goal: Nutrition Consult-if indicated Outcome: Progressing Goal: Diabetes Guidelines if Diabetic/Glucose > 140 Description: If diabetic or lab glucose is > 140 mg/dl - Initiate Diabetes/Hyperglycemia Guidelines & Document Interventions  Outcome: Progressing   Problem: RH BOWEL ELIMINATION Goal: RH STG MANAGE BOWEL WITH ASSISTANCE Description: STG Manage Bowel with mod I Assistance. Outcome: Progressing   Problem: RH BLADDER ELIMINATION Goal: RH STG MANAGE BLADDER WITH ASSISTANCE Description: STG Manage Bladder With mod I Assistance Outcome: Progressing   Problem: RH SKIN INTEGRITY Goal: RH STG SKIN FREE OF INFECTION/BREAKDOWN Description: Manage skin with min assist Outcome: Progressing Goal: RH STG MAINTAIN SKIN INTEGRITY WITH ASSISTANCE Description: STG Maintain Skin Integrity With mod I Assistance. Outcome: Progressing Goal: RH STG ABLE TO PERFORM INCISION/WOUND CARE W/ASSISTANCE Description: STG Able To Perform Incision/Wound Care With mod I Assistance. Outcome: Progressing   Problem: RH SAFETY Goal: RH STG ADHERE TO SAFETY PRECAUTIONS W/ASSISTANCE/DEVICE Description: STG Adhere to Safety Precautions With mod I Assistance/Device. Outcome: Progressing   Problem: RH PAIN MANAGEMENT Goal: RH STG PAIN MANAGED AT OR BELOW PT'S PAIN GOAL Outcome: Progressing   Problem: RH KNOWLEDGE DEFICIT GENERAL Goal: RH STG INCREASE KNOWLEDGE OF SELF CARE AFTER HOSPITALIZATION Description: Patient will be able to manage care at discharge independently Outcome: Progressing

## 2020-01-29 NOTE — Progress Notes (Signed)
Physical Therapy Session Note  Patient Details  Name: Paul [name] MRN: [id number] Date of Birth: 02-09-1956  Today's Date: 01/29/2020 PT Individual Time: 6681-5947 PT Individual Time Calculation (min): 45 min   Short Term Goals: Week 1:  PT Short Term Goal 1 (Week 1): STG=LTG based on ELOS PT Short Term Goal 1 - Progress (Week 1): Progressing toward goal Week 2:  PT Short Term Goal 1 (Week 2): = to LTGs based on ELOS     Skilled Therapeutic Interventions/Progress Updates:  Pt seated in w/c.  He denied pain.  LLE prosthesis already donned.  W/c propulsion throughout unit, distant supervision, for endurance.  Lateral scoot trransfer w/c>> mat to R, supervision.  Pt doffed/donned prosthesis while on mat, independent.  Core exs in supine: 10 x 1 each- cervical flexion, pelvic tilts, and bil adductor squeezes. Seated EOM without prosthesis- 10 x 1 press-ups (dips).     Using hand out of HEP, with cues, side lying, 10 x 1 - R hip abducition with L hip flexion.  Prone:  10 x 1 R/L hip extension with flexed knee to target gluts.   At end of session, pt seated in w/c with needs at hand.     Therapy Documentation Precautions:  Precautions Precautions: Fall Precaution Comments: B BKA, no pillow under R knee Restrictions Weight Bearing Restrictions: Yes RLE Weight Bearing: Non weight bearing        Therapy/Group: Individual Therapy  Flornce Record 01/29/2020, 12:38 PM

## 2020-01-30 ENCOUNTER — Inpatient Hospital Stay (HOSPITAL_COMMUNITY): Payer: Managed Care, Other (non HMO)

## 2020-01-30 LAB — GLUCOSE, CAPILLARY
Glucose-Capillary: 97 mg/dL (ref 70–99)
Glucose-Capillary: 112 mg/dL — ABNORMAL HIGH (ref 70–99)
Glucose-Capillary: 149 mg/dL — ABNORMAL HIGH (ref 70–99)
Glucose-Capillary: 112 mg/dL — ABNORMAL HIGH (ref 70–99)
Glucose-Capillary: 294 mg/dL — ABNORMAL HIGH (ref 70–99)

## 2020-01-30 NOTE — Progress Notes (Signed)
Occupational Therapy Session Note  Patient Details  Name: Paul [name] MRN: [id number] Date of Birth: December 22, 1955  Today's Date: 01/30/2020 OT Individual Time: 1840-3754 OT Individual Time Calculation (min): 55 min    Short Term Goals: Week 1:  OT Short Term Goal 1 (Week 1): Short term goals equal to LTGs set at overall supervision level based on ELOS. Week 2:  OT Short Term Goal 1 (Week 2): Short term goals equal to LTGs set at overall supervision level based on ELOS.  Skilled Therapeutic Interventions/Progress Updates:    Pt resting in w/c upon arrival.  Pt requested to bathe and change clothing at sink this afternoon.  Pt also requested to wash hair and provided with tray to wash hair at sink.  Pt completed bathing/dressing tasks with supervision.  Pt propelled w/c to ADL apartment and practiced w/c<>recliner transfers X 3.  Pt performed squat pivot transfer to recliner and SB transfer back to w/c. Recommended raising surface of recliner seat height at home to make transfers more level.  Pt verbalized understanding. Pt pleased with progress and ready for discharge home tomorrow.   Therapy Documentation Precautions:  Precautions Precautions: Fall Precaution Comments: B BKA, no pillow under R knee Restrictions Weight Bearing Restrictions: Yes RLE Weight Bearing: Non weight bearing  Pain:  Pt denies pain this afternoon   Therapy/Group: Individual Therapy  Leroy Libman 01/30/2020, 2:40 PM

## 2020-01-30 NOTE — Progress Notes (Signed)
[facility] PHYSICAL MEDICINE & REHABILITATION PROGRESS NOTE   Subjective/Complaints: Patient seen laying in bed this morning.  He states he did not sleep well overnight due to returning from dialysis at 2 AM.  He was seen by nephrology yesterday, notes reviewed-Aranesp increased.  ROS: Denies SOB, abd pain, CP, N/V/D   Objective:   No results found. Recent Labs    01/27/20 1230  WBC 6.2  HGB 8.2*  HCT 26.2*  PLT 146*   Recent Labs    01/27/20 1330  NA 134*  K 4.7  CL 102  CO2 18*  GLUCOSE 177*  BUN 69*  CREATININE 3.55*  CALCIUM 8.0*    Intake/Output Summary (Last 24 hours) at 01/30/2020 0859 Last data filed at 01/30/2020 0450 Gross per 24 hour  Intake 900 ml  Output 2652 ml  Net -1752 ml     Physical Exam: Vital Signs Blood pressure (!) 149/70, pulse (!) 58, temperature 98.4 F (36.9 C), resp. rate 16, height 4' 10.5" (1.486 m), weight 66.7 kg, SpO2 100 %. Constitutional: No distress . Vital signs reviewed. HENT: Normocephalic.  Atraumatic. Eyes: EOMI. No discharge. Cardiovascular: No JVD.  RRR. Respiratory: Normal effort.  No stridor.  Bilateral clear to auscultation. GI: Non-distended.  BS +. Skin: Right BKA with dressing CDI Psych: Normal mood.  Normal behavior. Musc: Right BKA with edema and tenderness Left BKA healed left Neuro: Alert Motor: Bilateral upper extremities: 5/5 proximal distal Bilateral lower extremities: Hip flexion 5/5, unchanged  Assessment/Plan: 1. Functional deficits secondary to Right BKA revision, hx Left BKA which require 3+ hours per day of interdisciplinary therapy in a comprehensive inpatient rehab setting.  Physiatrist is providing close team supervision and 24 hour management of active medical problems listed below.  Physiatrist and rehab team continue to assess barriers to discharge/monitor patient progress toward functional and medical goals  Care Tool:  Bathing    Body parts bathed by patient: Right arm, Left arm,  Chest, Abdomen, Front perineal area, Buttocks, Right upper leg, Left upper leg, Face     Body parts n/a: Right lower leg, Left lower leg   Bathing assist Assist Level: Contact Guard/Touching assist     Upper Body Dressing/Undressing Upper body dressing   What is the patient wearing?: Pull over shirt    Upper body assist Assist Level: Supervision/Verbal cueing    Lower Body Dressing/Undressing Lower body dressing      What is the patient wearing?: Pants     Lower body assist Assist for lower body dressing: Set up assist     Toileting Toileting    Toileting assist Assist for toileting: Supervision/Verbal cueing Assistive Device Comment: bedpan   Transfers Chair/bed transfer  Transfers assist     Chair/bed transfer assist level: Supervision/Verbal cueing     Locomotion Ambulation   Ambulation assist   Ambulation activity did not occur: Safety/medical concerns (status as B BKA)          Walk 10 feet activity   Assist  Walk 10 feet activity did not occur: Safety/medical concerns (status as B BKA)        Walk 50 feet activity   Assist Walk 50 feet with 2 turns activity did not occur: Safety/medical concerns (status as B BKA)         Walk 150 feet activity   Assist Walk 150 feet activity did not occur: Safety/medical concerns (status as B BKA)         Walk 10 feet on uneven surface  activity   Assist Walk 10 feet on uneven surfaces activity did not occur: Safety/medical concerns (status as B BKA)         Wheelchair     Assist Will patient use wheelchair at discharge?: Yes Type of Wheelchair: Manual    Wheelchair assist level: Independent Max wheelchair distance: >300    Wheelchair 50 feet with 2 turns activity    Assist        Assist Level: Independent   Wheelchair 150 feet activity     Assist      Assist Level: Independent   Blood pressure (!) 149/70, pulse (!) 58, temperature 98.4 F (36.9 C), resp.  rate 16, height 4' 10.5" (1.486 m), weight 66.7 kg, SpO2 100 %.    Medical Problem List and Plan: 1.  Decreased functional ability secondary to cellulitis/osteomyelitis right BKA with history of bilateral BKA.  Status post right BKA revision 01/12/2020.  Wound VAC has been discontinued  Continue CIR 2.  Antithrombotics: -DVT/anticoagulation: Eliquis             -antiplatelet therapy: Aspirin 81 mg daily 3. Pain Management: Oxycodone discontinued  Right gluteus medius strain, ordered Sportscreme-improving 4. Mood: Zoloft 50 mg daily.  Provide emotional support             -antipsychotic agents: N/A 5. Neuropsych: This patient is capable of making decisions on his own behalf. 6. Skin/Wound Care: Routine skin checks 7. Fluids/Electrolytes/Nutrition: Routine in and outs 8.  Acute kidney injury on CKD stage IV with history of kidney transplant 2016.    ESRD now initiated 01/04/2020.  Outpatient dialysis set up for Tuesday Thursday Saturday at [facility] dialysis center.  Continue low-dose chronic prednisone, Myfortic 180 mg twice daily as well as Prograf 2 mg twice daily  Appreciate nephro recs 9.  Chronic anemia.  Continue Aranesp  Hemoglobin 8.2 on 9/9 10.  Diabetes mellitus with peripheral neuropathy (on chronic prednisone).  Hemoglobin A1c CBG (last 3)  Recent Labs    01/29/20 1624 01/30/20 0128 01/30/20 0614  GLUCAP 251* 97 112*   Labile on 9/12, monitor for trend 11.  Atrial fibrillation.  Amiodarone as directed.  Continue Eliquis.  Cardiac rate controlled  Controlled on 9/12 12.  Hyperlipidemia.  Lipitor 13. Stage II sacral pressure ulcer- on admission- will need to con't foam dressing, keep pt off backside unless eating or doing therapy- might benefit from air mattress if won't impair transfers.  14.  History of constipation improved with laxatives  Improved 15. L BKA callus, pressure spot, follow-up orthotics/prosthetics, inner liner removed discused fluctuating limb edema,  now that pt is on HD  LOS: 12 days A FACE TO FACE EVALUATION WAS PERFORMED  Paul [name] Paul [name] 01/30/2020, 8:59 AM

## 2020-01-30 NOTE — Plan of Care (Signed)
  Problem: Consults Goal: RH GENERAL PATIENT EDUCATION Description: See Patient Education module for education specifics. Outcome: Progressing Goal: Skin Care Protocol Initiated - if Braden Score 18 or less Description: If consults are not indicated, leave blank or document N/A Outcome: Progressing Goal: Nutrition Consult-if indicated Outcome: Progressing Goal: Diabetes Guidelines if Diabetic/Glucose > 140 Description: If diabetic or lab glucose is > 140 mg/dl - Initiate Diabetes/Hyperglycemia Guidelines & Document Interventions  Outcome: Progressing   Problem: RH BOWEL ELIMINATION Goal: RH STG MANAGE BOWEL WITH ASSISTANCE Description: STG Manage Bowel with mod I Assistance. Outcome: Progressing   Problem: RH BLADDER ELIMINATION Goal: RH STG MANAGE BLADDER WITH ASSISTANCE Description: STG Manage Bladder With mod I Assistance Outcome: Progressing   Problem: RH SKIN INTEGRITY Goal: RH STG SKIN FREE OF INFECTION/BREAKDOWN Description: Manage skin with min assist Outcome: Progressing Goal: RH STG MAINTAIN SKIN INTEGRITY WITH ASSISTANCE Description: STG Maintain Skin Integrity With mod I Assistance. Outcome: Progressing Goal: RH STG ABLE TO PERFORM INCISION/WOUND CARE W/ASSISTANCE Description: STG Able To Perform Incision/Wound Care With mod I Assistance. Outcome: Progressing   Problem: RH SAFETY Goal: RH STG ADHERE TO SAFETY PRECAUTIONS W/ASSISTANCE/DEVICE Description: STG Adhere to Safety Precautions With mod I Assistance/Device. Outcome: Progressing   Problem: RH PAIN MANAGEMENT Goal: RH STG PAIN MANAGED AT OR BELOW PT'S PAIN GOAL Outcome: Progressing   Problem: RH KNOWLEDGE DEFICIT GENERAL Goal: RH STG INCREASE KNOWLEDGE OF SELF CARE AFTER HOSPITALIZATION Description: Patient will be able to manage care at discharge independently Outcome: Progressing

## 2020-01-30 NOTE — Progress Notes (Signed)
[facility] Progress Note    Assessment/ Plan:   1. Acute kidney injury on CKD4, now progressed to ESRD: BL cr 2.8-3.4 s/p DDRT with DGF complicated by ACR and antibody mediated rejection requiring IVIG as recently as 2018.  Started dialysis 01/04/2020.  No sign of renal recovery therefore we will continue TTS schedule for dialysis. He has outpatient dialysis set up for TTS at [facility]- can start 9/14 as discharge is planned for 9/13. Patient has functional dialysis access (AVF).  Using 2K/2.5 Ca bath.  BFR 400 AVF/ DFR 800.  Rx time 3.5 hrs, may be adequate in the setting of b/l BKA but may need time increase to 3.75 hr.      2. Kidney transplant: Tapering IS- prednisone-tac tapered to 2/2 on 8/18->mycophenolate tapered to 180 mg twice daily on 8/22.  Last tacrolimus level was less than 1  12/31/2019.  Will continue to wean as OP.   3. Hypertension/volume: some soft BPs and he reports some dizziness.  Off amlodipine.  4. Secondary hyperparathyroidism/hyperphosphatemia: PTH level 43. Phosphorus level improving,off binders, monitor lab.  5. Anemia of CKD, acuteblood loss anemia:Received blood transfusion.Continue Aranesp 60 q Thursday--> increase to 100 for next time is given  Iron saturation 81 Monitor hemoglobin and transfuse as needed.  6. Severe sepsis due to right leg osteomyelitis/abscess on BKA stump site:Status post revision of right below-knee amputation by Dr. Sharol Given on 8/25.   Transfered to [place] [facility] for discharge on 9/13   Subjective:    S/p HD yesterday with 2.5L removed.     Objective:   BP (!) 149/70 (BP Location: Left Arm)   Pulse (!) 58   Temp 98.4 F (36.9 C)   Resp 16   Ht 4' 10.5" (1.486 m)   Wt 66.7 kg   SpO2 100%   BMI 30.21 kg/m   Intake/Output Summary (Last 24 hours) at 01/30/2020 1343 Last data filed at 01/30/2020 0800 Gross per 24 hour  Intake 720 ml  Output 2652 ml  Net -1932 ml   Weight change: 0.3 kg  Physical  Exam: Chart review only today  Imaging: No results found.  Labs: BMET Recent Labs  Lab 01/24/20 1610 01/27/20 1330  NA 136 134*  K 4.5 4.7  CL 100 102  CO2 23 18*  GLUCOSE 209* 177*  BUN 58* 69*  CREATININE 3.88* 3.55*  CALCIUM 8.0* 8.0*  PHOS 5.8* 5.6*   CBC Recent Labs  Lab 01/24/20 1610 01/27/20 1230  WBC 8.2 6.2  HGB 9.2* 8.2*  HCT 30.2* 26.2*  MCV 96.2 96.3  PLT 138* 146*    Medications:    . amiodarone  200 mg Oral Daily  . apixaban  5 mg Oral BID  . aspirin EC  81 mg Oral Daily  . atorvastatin  10 mg Oral Q M,W,F  . bisacodyl  10 mg Oral Daily  . Chlorhexidine Gluconate Cloth  6 each Topical Q0600  . cholecalciferol  4,000 Units Oral Daily  . darbepoetin (ARANESP) injection - DIALYSIS  60 mcg Intravenous Q Thu-HD  . feeding supplement (NEPRO CARB STEADY)  237 mL Oral TID WC  . insulin aspart  0-9 Units Subcutaneous TID WC  . insulin glargine  8 Units Subcutaneous Daily  . lipase/protease/amylase  12,000 Units Oral TID AC  . magnesium chloride  2 tablet Oral BID  . mycophenolate  180 mg Oral BID  . pantoprazole  40 mg Oral Daily  . polyethylene glycol  17 g Oral Daily  .  predniSONE  5 mg Oral Q breakfast  . sertraline  50 mg Oral Daily  . tacrolimus  2 mg Oral BID  . tamsulosin  0.4 mg Oral QPC supper      Paul [name]  01/30/2020, 1:43 PM

## 2020-01-30 NOTE — Progress Notes (Signed)
Late HD treatment, returned around 0130. Incontinent of B&B. Uses urinal at times. Right BKA with ace wrap CD&I. Stage 2 to sacrum with foam dressing in place. Lipoma to lateral left bka, not as tender, able to wear prosthesis now. No PRN meds requested. Paul [name] A

## 2020-01-30 NOTE — Progress Notes (Signed)
   01/30/20 0100  Hand-Off documentation  Handoff Given Given to shift RN/LPN  Report given to (Full Name) Patrici Ranks, RN  Handoff Received Received from shift RN/LPN  Report received from (Full Name) Wilhemina Grall  Vital Signs  Temp 99.5 F (37.5 C)  Temp Source Oral  Pulse Rate 86  Pulse Rate Source Monitor  Resp 18  BP 110/62  BP Location Left Arm  BP Method Automatic  Patient Position (if appropriate) Lying  Oxygen Therapy  SpO2 99 %  O2 Device Room Air  Pain Assessment  Pain Scale 0-10  Pain Score 0  Post-Hemodialysis Assessment  Rinseback Volume (mL) 250 mL  KECN 295 V  Dialyzer Clearance Lightly streaked  Duration of HD Treatment -hour(s) 3.5 hour(s)  Hemodialysis Intake (mL) 500 mL  UF Total -Machine (mL) 3002 mL  Net UF (mL) 2502 mL  Tolerated HD Treatment Yes  Post-Hemodialysis Comments tx achieved as expected, well tolerated, no complaints.  AVG/AVF Arterial Site Held (minutes) 10 minutes  AVG/AVF Venous Site Held (minutes) 15 minutes  Education / Care Plan  Dialysis Education Provided Yes  Fistula / Graft Right Forearm Arteriovenous fistula  Placement Date/Time: 05/22/12 0831   Placed prior to admission: No  Orientation: Right  Access Location: Forearm  Access Type: Arteriovenous fistula  Expiration Date: 03/21/15  Site Condition No complications  Fistula / Graft Assessment Present;Thrill;Bruit  Status Deaccessed  Drainage Description None

## 2020-01-30 NOTE — Progress Notes (Signed)
   01/30/20 0100  Hand-Off documentation  Handoff Given Given to shift RN/LPN  Report given to (Full Name) Patrici Ranks, RN  Handoff Received Received from shift RN/LPN  Report received from (Full Name) Yen Wandell  Vital Signs  Temp 99.5 F (37.5 C)  Temp Source Oral  Pulse Rate 86  Pulse Rate Source Monitor  Resp 18  BP 110/62  BP Location Left Arm  BP Method Automatic  Patient Position (if appropriate) Lying  Oxygen Therapy  SpO2 99 %  O2 Device Room Air  Pain Assessment  Pain Scale 0-10  Pain Score 0  Post-Hemodialysis Assessment  Rinseback Volume (mL) 250 mL  KECN 295 V  Dialyzer Clearance Lightly streaked  Duration of HD Treatment -hour(s) 3.5 hour(s)  Hemodialysis Intake (mL) 500 mL  UF Total -Machine (mL) 3002 mL  Net UF (mL) 2502 mL  Tolerated HD Treatment Yes  Post-Hemodialysis Comments tx achieved as expected, well tolerated, no complaints.  AVG/AVF Arterial Site Held (minutes) 10 minutes  AVG/AVF Venous Site Held (minutes) 15 minutes  Education / Care Plan  Dialysis Education Provided Yes  Fistula / Graft Right Forearm Arteriovenous fistula  Placement Date/Time: 05/22/12 0831   Placed prior to admission: No  Orientation: Right  Access Location: Forearm  Access Type: Arteriovenous fistula  Expiration Date: 03/21/15  Site Condition No complications  Fistula / Graft Assessment Present;Thrill;Bruit  Status Deaccessed  Drainage Description None

## 2020-01-31 LAB — GLUCOSE, CAPILLARY: Glucose-Capillary: 76 mg/dL (ref 70–99)

## 2020-01-31 MED ORDER — ATORVASTATIN CALCIUM 10 MG PO TABS: 10.0000 mg | ORAL_TABLET | ORAL | 0 refills | Status: AC

## 2020-01-31 MED ORDER — DARBEPOETIN ALFA 60 MCG/0.3ML IJ SOSY: 60.0000 ug | PREFILLED_SYRINGE | INTRAMUSCULAR | Status: AC

## 2020-01-31 MED ORDER — OMEPRAZOLE 40 MG PO CPDR: 40.0000 mg | DELAYED_RELEASE_CAPSULE | Freq: Every day | ORAL | 0 refills | Status: AC

## 2020-01-31 MED ORDER — PANCRELIPASE (LIP-PROT-AMYL) 12000-38000 UNITS PO CPEP: 12000.0000 [IU] | ORAL_CAPSULE | Freq: Three times a day (TID) | ORAL | 0 refills | Status: AC

## 2020-01-31 MED ORDER — AMIODARONE HCL 200 MG PO TABS: 200.0000 mg | ORAL_TABLET | Freq: Every day | ORAL | 0 refills | Status: DC

## 2020-01-31 MED ORDER — SLOW-MAG 71.5-119 MG PO TBEC: 2.0000 | DELAYED_RELEASE_TABLET | Freq: Two times a day (BID) | ORAL | 0 refills | Status: AC

## 2020-01-31 MED ORDER — TACROLIMUS 1 MG PO CAPS: 2.0000 mg | ORAL_CAPSULE | Freq: Two times a day (BID) | ORAL | 0 refills | Status: AC

## 2020-01-31 MED ORDER — MYCOPHENOLATE SODIUM 180 MG PO TBEC: 360.0000 mg | DELAYED_RELEASE_TABLET | Freq: Two times a day (BID) | ORAL | 0 refills | Status: DC

## 2020-01-31 MED ORDER — SERTRALINE HCL 50 MG PO TABS: 50.0000 mg | ORAL_TABLET | Freq: Every day | ORAL | 0 refills | Status: AC

## 2020-01-31 MED ORDER — PREDNISONE 5 MG PO TABS: 5.0000 mg | ORAL_TABLET | Freq: Every day | ORAL | 0 refills | Status: AC

## 2020-01-31 MED ORDER — APIXABAN 5 MG PO TABS: 5.0000 mg | ORAL_TABLET | Freq: Two times a day (BID) | ORAL | 4 refills | Status: AC

## 2020-01-31 MED ORDER — TAMSULOSIN HCL 0.4 MG PO CAPS: 0.4000 mg | ORAL_CAPSULE | Freq: Every day | ORAL | 0 refills | Status: AC

## 2020-01-31 MED ORDER — BASAGLAR KWIKPEN 100 UNIT/ML ~~LOC~~ SOPN: 8.0000 [IU] | PEN_INJECTOR | Freq: Every day | SUBCUTANEOUS | 0 refills | Status: AC

## 2020-01-31 MED ORDER — CVS D3 50 MCG (2000 UT) PO CAPS: 4000.0000 [IU] | ORAL_CAPSULE | Freq: Every day | ORAL | 0 refills | Status: AC

## 2020-01-31 NOTE — Progress Notes (Signed)
Inpatient Rehabilitation Care Coordinator  Discharge Note  The overall goal for the admission was met for:   Discharge location: Yes, Home  Length of Stay: Yes, 13 Days  Discharge activity level: Yes, Supervision/MOD I for WC mobility  Home/community participation: Yes  Services provided included: MD, RD, PT, OT, SLP, RN, CM, TR, Pharmacy and [place]: Medicare/Cign[employer]/ BCBS  Follow-up services arranged: Home Health: [facility] (SN, PT, OT)  Comments (or additional information):   Patient/Family verbalized understanding of follow-up arrangements: Yes  Individual responsible for coordination of the follow-up plan: self, [phone]  Confirmed correct DME delivered: Dyanne Iha 01/31/2020    Dyanne Iha

## 2020-01-31 NOTE — Discharge Instructions (Signed)
Inpatient Rehab Discharge Instructions  Paul [name] Discharge date and time: No discharge date for patient encounter.   Activities/Precautions/ Functional Status: Activity: activity as tolerated Diet: renal diet Wound Care: Cleanse stump antibacterial soap/DIAL and water daily Functional status:  ___ No restrictions     ___ Walk up steps independently ___ 24/7 supervision/assistance   ___ Walk up steps with assistance ___ Intermittent supervision/assistance  ___ Bathe/dress independently ___ Walk with walker     __x_ Bathe/dress with assistance ___ Walk Independently    ___ Shower independently ___ Walk with assistance    ___ Shower with assistance ___ No alcohol     ___ Return to work/school ________   COMMUNITY REFERRALS UPON DISCHARGE:    Home Health:   PT    OT     SN               Agency: [facility]) Phone: [phone]   Medical Equipment/Items Ordered: R Amputee Pad                                                 Agency/Supplier: [employer]    Special Instructions: No driving smoking or alcohol  Continue hemodialysis as directed   My questions have been answered and I understand these instructions. I will adhere to these goals and the provided educational materials after my discharge from the hospital.  Patient/Caregiver Signature _______________________________ Date __________  Clinician Signature _______________________________________ Date __________  Please bring this form and your medication list with you to all your follow-up doctor's appointments.

## 2020-01-31 NOTE — Progress Notes (Addendum)
Patient ID: Paul [name], male   DOB: 1956-04-04, 64 y.o.   MRN: [id number]  Patient [facility] [place] discharge transportation set for TODAY, September 13th between 10:30AM-11:00AM. Patient will need to be downstairs between this timeframe.

## 2020-01-31 NOTE — Progress Notes (Signed)
Renal Navigator informed Renal PA of patient's discharge today and asked that he send orders to clinic. Clinic aware of discharge. Patient and wife previously aware of plan and transportation has been arranged for OP HD this week.  Alphonzo Cruise, [place] Renal Navigator [phone]

## 2020-01-31 NOTE — Progress Notes (Signed)
[place] PHYSICAL MEDICINE & REHABILITATION PROGRESS NOTE   Subjective/Complaints:   Pt reports he's going home today, by transportation.  Says can now wear L BKA prosthesis- they fixed it so can wear- and making transfers much easier   No other issues- PA to go over meds before d/c  ROS:  Pt denies SOB, abd pain, CP, N/V/C/D, and vision changes    Objective:   No results found. No results for input(s): WBC, HGB, HCT, PLT in the last 72 hours. No results for input(s): NA, K, CL, CO2, GLUCOSE, BUN, CREATININE, CALCIUM in the last 72 hours.  Intake/Output Summary (Last 24 hours) at 01/31/2020 0848 Last data filed at 01/30/2020 2200 Gross per 24 hour  Intake 360 ml  Output --  Net 360 ml     Physical Exam: Vital Signs Blood pressure (!) 134/58, pulse 60, temperature 98.3 F (36.8 C), temperature source Oral, resp. rate 16, height 4' 10.5" (1.486 m), weight 66.2 kg, SpO2 100 %. Constitutional: No distress . Vital signs reviewed. sititng up in bed- appropriate, NAD HENT: Normocephalic.  Atraumatic. Eyes: EOMI. No discharge. Cardiovascular: RRR Respiratory: CTA B/L- no W/R/R- good air movement GI: Soft, NT, ND, (+)BS  Skin: Right BKA with dressing CDI Psych: Normal mood.  Normal behavior. Musc: Right BKA with edema and tenderness Left BKA healed left- lipoma on L/lateral aspect near scar Neuro: Ox3 Motor: Bilateral upper extremities: 5/5 proximal distal Bilateral lower extremities: Hip flexion 5/5, unchanged  Assessment/Plan: 1. Functional deficits secondary to Right BKA revision, hx Left BKA which require 3+ hours per day of interdisciplinary therapy in a comprehensive inpatient rehab setting.  Physiatrist is providing close team supervision and 24 hour management of active medical problems listed below.  Physiatrist and rehab team continue to assess barriers to discharge/monitor patient progress toward functional and medical goals  Care Tool:  Bathing    Body  parts bathed by patient: Right arm, Left arm, Chest, Abdomen, Front perineal area, Buttocks, Right upper leg, Left upper leg, Face     Body parts n/a: Right lower leg, Left lower leg   Bathing assist Assist Level: Supervision/Verbal cueing     Upper Body Dressing/Undressing Upper body dressing   What is the patient wearing?: Pull over shirt    Upper body assist Assist Level: Independent    Lower Body Dressing/Undressing Lower body dressing      What is the patient wearing?: Pants     Lower body assist Assist for lower body dressing: Set up assist     Toileting Toileting    Toileting assist Assist for toileting: Supervision/Verbal cueing Assistive Device Comment: bedpan   Transfers Chair/bed transfer  Transfers assist     Chair/bed transfer assist level: Supervision/Verbal cueing     Locomotion Ambulation   Ambulation assist   Ambulation activity did not occur: Safety/medical concerns (bilateral BKA, decreased balance/postural control, generalized weakness)          Walk 10 feet activity   Assist  Walk 10 feet activity did not occur: Safety/medical concerns (bilateral BKA, decreased balance/postural control, generalized weakness)        Walk 50 feet activity   Assist Walk 50 feet with 2 turns activity did not occur: Safety/medical concerns (bilateral BKA, decreased balance/postural control, generalized weakness)         Walk 150 feet activity   Assist Walk 150 feet activity did not occur: Safety/medical concerns (bilateral BKA, decreased balance/postural control, generalized weakness)  Walk 10 feet on uneven surface  activity   Assist Walk 10 feet on uneven surfaces activity did not occur: Safety/medical concerns (bilateral BKA, decreased balance/postural control, generalized weakness)         Wheelchair     Assist Will patient use wheelchair at discharge?: Yes Type of Wheelchair: Manual    Wheelchair assist level:  Independent Max wheelchair distance: >300    Wheelchair 50 feet with 2 turns activity    Assist        Assist Level: Independent   Wheelchair 150 feet activity     Assist      Assist Level: Independent   Blood pressure (!) 134/58, pulse 60, temperature 98.3 F (36.8 C), temperature source Oral, resp. rate 16, height 4' 10.5" (1.486 m), weight 66.2 kg, SpO2 100 %.    Medical Problem List and Plan: 1.  Decreased functional ability secondary to cellulitis/osteomyelitis right BKA with history of bilateral BKA.  Status post right BKA revision 01/12/2020.  Wound VAC has been discontinued  Continue CIR 2.  Antithrombotics: -DVT/anticoagulation: Eliquis             -antiplatelet therapy: Aspirin 81 mg daily 3. Pain Management: Oxycodone discontinued  Right gluteus medius strain, ordered Sportscreme-improving 4. Mood: Zoloft 50 mg daily.  Provide emotional support             -antipsychotic agents: N/A 5. Neuropsych: This patient is capable of making decisions on his own behalf. 6. Skin/Wound Care: Routine skin checks 7. Fluids/Electrolytes/Nutrition: Routine in and outs 8.  Acute kidney injury on CKD stage IV with history of kidney transplant 2016.    ESRD now initiated 01/04/2020.  Outpatient dialysis set up for Tuesday Thursday Saturday at [facility] dialysis center.  Continue low-dose chronic prednisone, Myfortic 180 mg twice daily as well as Prograf 2 mg twice daily  Appreciate nephro recs 9.  Chronic anemia.  Continue Aranesp  Hemoglobin 8.2 on 9/9 10.  Diabetes mellitus with peripheral neuropathy (on chronic prednisone).  Hemoglobin A1c CBG (last 3)  Recent Labs    01/30/20 1632 01/30/20 2055 01/31/20 0605  GLUCAP 112* 294* 76   9/13- BG overall controlled except x1- con't regimen 11.  Atrial fibrillation.  Amiodarone as directed.  Continue Eliquis.  Cardiac rate controlled  9/13- controlled- con't regimen 12.  Hyperlipidemia.  Lipitor 13. Stage II sacral  pressure ulcer- on admission- will need to con't foam dressing, keep pt off backside unless eating or doing therapy- might benefit from air mattress if won't impair transfers.  14.  History of constipation improved with laxatives  Improved 15. L BKA callus, pressure spot, follow-up orthotics/prosthetics, inner liner removed discused fluctuating limb edema, now that pt is on HD  9/13- can no wear prosthesis     LOS: 13 days A FACE TO FACE EVALUATION WAS PERFORMED  Roniya Tetro 01/31/2020, 8:48 AM

## 2020-01-31 NOTE — Progress Notes (Signed)
Patient ID: Paul [name], male   DOB: June 18, 1955, 64 y.o.   MRN: [id number]  Patient and spouse received discharge instructions from Marlowe [place], PA-C with verbal understanding. Patient to be discharged to home via [facility]. Spouse to take belongings home with her. Patient received all morning medications, all questions asked and answered with verbal understanding.  Dorthula Nettles, RN, BSN, [place] Office [phone] Cell [phone]

## 2020-01-31 NOTE — Progress Notes (Signed)
Patient ID: Paul [name], male   DOB: [birth year], 64 y.o.   MRN: [id number]  [name] from  [facility] will be accepting patient for [facility]. She will assist patient with contact Medicare to ensure Medicare is primary. Patient has been terminated from [place] since June/July. [facility] will see patient for SN, PT, OT. SW had provided patient with contact information to schedule visit and handle call with Medicare.   [place], [place]

## 2020-01-31 NOTE — Progress Notes (Signed)
Patient ID: Paul [name], male   DOB: [birth year], 64 y.o.   MRN: [id number]   Sw updated on Sunday from [facility] they are no longer able to accept patient due to [place] managed care being primary insurance. Sw was informed on Thursday that patient primary insurance was Medicare A &B from [place] at pre servicing. Upon checking system today patient has been changed to [facility] managed care.  Sw will attempt to find patient HH. If patient primary insurance is Christella Scheuermann unsure it patient will be able to receive HH, will attempt. If not, patient will need to be set up with outpatient. Will keep patient updated.   [place], [place]

## 2020-02-02 ENCOUNTER — Ambulatory Visit: Payer: Managed Care, Other (non HMO)

## 2020-02-04 ENCOUNTER — Ambulatory Visit: Payer: Managed Care, Other (non HMO)

## 2020-02-09 ENCOUNTER — Ambulatory Visit: Payer: Managed Care, Other (non HMO) | Admitting: Rehabilitation

## 2020-02-11 ENCOUNTER — Ambulatory Visit: Payer: Managed Care, Other (non HMO)

## 2020-02-14 ENCOUNTER — Ambulatory Visit (INDEPENDENT_AMBULATORY_CARE_PROVIDER_SITE_OTHER): Payer: Managed Care, Other (non HMO) | Admitting: Physician Assistant

## 2020-02-14 ENCOUNTER — Encounter: Payer: Self-pay | Admitting: Orthopedic Surgery

## 2020-02-14 VITALS — Ht 58.5 in | Wt 145.0 lb

## 2020-02-14 DIAGNOSIS — Z89511 Acquired absence of right leg below knee: Secondary | ICD-10-CM

## 2020-02-14 DIAGNOSIS — Z89512 Acquired absence of left leg below knee: Secondary | ICD-10-CM

## 2020-02-14 NOTE — Progress Notes (Signed)
Office Visit Note   Patient: Paul [name]           Date of Birth: [birth year]           MRN: [id number] Visit Date: 02/14/2020              Requested by: Darrow Bussing, MD [address] [place],  [place] [address] PCP: Darrow Bussing, MD  Chief Complaint  Patient presents with  . Right Leg - Follow-up    Right below knee amputation 01/12/2020      HPI: This is a pleasant 64 year old gentleman who is 5 weeks status post right below-knee amputation revision.  He was in rehab for fit.  He is doing well and has been wrapping his leg and steady using his shrinkers.  He states he has not wash the area since surgery  Assessment & Plan: Visit Diagnoses: No diagnosis found.  Plan: We will remove surgical stitches today he should begin to use his shrinkers follow-up in 2 weeks I have provided him with a prescription for [employer] for a prosthetic set up Patient is an existing right transtibial  amputee.  Patient's current comorbidities are not expected to impact the ability to function with the prescribed prosthesis. Patient verbally communicates a strong desire to use a prosthesis. Patient currently requires mobility aids to ambulate without a prosthesis.  Expects not to use mobility aids with a new prosthesis.  Patient is a K3 level ambulator that spends a lot of time walking around on uneven terrain over obstacles, up and down stairs, and ambulates with a variable cadence.   Follow-Up Instructions: No follow-ups on file.   Ortho Exam  Patient is alert, oriented, no adenopathy, well-dressed, normal affect, normal respiratory effort. Focused examination swelling is well controlled well-healed surgical incision no gapping no dehiscence no erythema or cellulitis  Imaging: No results found. No images are attached to the encounter.  Labs: Lab Results  Component Value Date   HGBA1C 10.3 (H) 12/31/2019   HGBA1C 8.1 (H) 04/24/2019   HGBA1C 13.6 (H) 10/29/2018    ESRSEDRATE 75 (H) 06/05/2013   CRP 5.9 (H) 01/04/2020   REPTSTATUS 10/17/2019 FINAL 10/12/2019   GRAMSTAIN  05/24/2013    RARE WBC PRESENT,BOTH PMN AND MONONUCLEAR NO ORGANISMS SEEN Performed at [facility] Performed at [facility]   GRAMSTAIN  05/24/2013    RARE WBC PRESENT,BOTH PMN AND MONONUCLEAR NO ORGANISMS SEEN   CULT  10/12/2019    NO GROWTH 5 DAYS Performed at [facility], [address], [place], [place] [address]    LABORGA ENTEROBACTER CLOACAE 08/02/2014     Lab Results  Component Value Date   ALBUMIN 2.2 (L) 01/27/2020   ALBUMIN 2.4 (L) 01/24/2020   ALBUMIN 2.2 (L) 01/22/2020    Lab Results  Component Value Date   MG 2.0 01/09/2020   MG 2.0 01/05/2020   MG 2.3 01/04/2020   No results found for: VD25OH  No results found for: PREALBUMIN CBC EXTENDED Latest Ref Rng & Units 01/27/2020 01/24/2020 01/22/2020  WBC 4.0 - 10.5 K/uL 6.2 8.2 7.4  RBC 4.22 - 5.81 MIL/uL 2.72(L) 3.14(L) 3.04(L)  HGB 13.0 - 17.0 g/dL 8.2(L) 9.2(L) 8.8(L)  HCT 39 - 52 % 26.2(L) 30.2(L) 29.3(L)  PLT 150 - 400 K/uL 146(L) 138(L) 127(L)  NEUTROABS 1.7 - 7.7 K/uL - - -  LYMPHSABS 0.7 - 4.0 K/uL - - -     Body mass index is 29.79 kg/m.  Orders:  No orders of the defined types were placed in this encounter.  No orders of the defined types were placed in this encounter.    Procedures: No procedures performed  Clinical Data: No additional findings.  ROS:  All other systems negative, except as noted in the HPI. Review of Systems  Objective: Vital Signs: Ht 4' 10.5" (1.486 m)   Wt 145 lb (65.8 kg)   BMI 29.79 kg/m   Specialty Comments:  No specialty comments available.  PMFS History: Patient Active Problem List   Diagnosis Date Noted  . Labile blood glucose   . Diabetic peripheral neuropathy ([place])   . Anemia of chronic disease   . Right below-knee amputee ([place]) 01/18/2020  . Amputation stump infection ([place])   . Subacute osteomyelitis of right  tibia ([place])   . Dehiscence of amputation stump ([place])   . Sepsis ([place]) 01/04/2020  . Cellulitis 12/31/2019  . CKD stage 4 due to type 2 diabetes mellitus ([place]) 04/29/2019  . AKI (acute kidney injury) ([place]) 04/24/2019  . Hyperglycemia 04/24/2019  . Nausea and vomiting 04/24/2019  . Fever 04/23/2019  . Acute metabolic encephalopathy 56/31/4970  . Stroke ([place]) 10/28/2018  . Middle cerebral artery embolism, left 10/28/2018  . Metabolic acidosis 26/37/8588  . Dehydration 05/24/2018  . Stage II pressure ulcer ([place]) 05/24/2018  . Dyspnea 05/23/2018  . Acute respiratory failure with hypoxia ([place]) 05/23/2018  . Abnormal cardiovascular stress test 12/29/2013  . Awaiting organ transplant 12/09/2013  . Leg ulcer ([place]) 11/17/2013  . Chronic kidney disease (CKD), stage V ([place]) 11/17/2013  . Diabetes ([place]) 11/17/2013  . Cancer of duodenum ([place]) 11/17/2013  . Dyslipidemia 11/17/2013  . Chronic kidney disease requiring chronic dialysis ([place]) 11/17/2013  . Hypercholesteremia 11/17/2013  . H/O malignant neoplasm 11/17/2013  . H/O: HTN (hypertension) 11/17/2013  . BP (high blood pressure) 11/17/2013  . Hypotension 11/17/2013  . Angiopathy, peripheral ([place]) 11/17/2013  . AF (paroxysmal atrial fibrillation) ([place]) 11/17/2013  . BKA stump complication ([place]) 50/27/7412  . S/P bilateral BKA (below knee amputation) ([place]) 08/03/2013  . S/P BKA (below knee amputation) bilateral ([place]) 07/30/2013  . Open wound of both legs with complication 87/86/7672  . Acute blood loss anemia 07/12/2013  . Syncope 07/09/2013  . Intolerance to CAPD peritoneal dialysis s/p CAPD cath removal 07/09/2013 07/05/2013  . Critical lower limb ischemia 06/30/2013  . Extremity pain 06/30/2013  . Atherosclerosis of native arteries of the extremities with ulceration(440.23) 06/17/2013  . Gait disorder 05/31/2013  . Weakness 05/24/2013  . Depression 04/03/2013  . GERD (gastroesophageal reflux disease) 04/03/2013  . Atrial  fibrillation ([place]) 04/02/2013  . DM (diabetes mellitus) ([place]) 12/28/2012  . Other complications due to renal dialysis device, implant, and graft 05/19/2012  . Diabetes mellitus, type 2 ([place]) 11/13/2011  . Anemia 07/02/2011  . ESRD on hemodialysis ([place]) 05/29/2011   Past Medical History:  Diagnosis Date  . A-fib ([place])   . Anemia    esrd  . Calciphylaxis 05/2013   lower ext  . Cancer ([place])    hx duodenal adenoCa 2002, resected  . Closed left arm fracture 1967  . Corneal abrasion, left   . Depression   . Diabetes mellitus    Type 1 iddm x 11 yrs  . Diabetic neuropathy ([place])   . ESRD on hemodialysis ([place])    Pt has ESRD due to DM.  He had a L upper arm AVF prior to starting HD which was ligated due to L arm steal syndrome.  He started HD in Jan 2014 and did home HD.  The family couldn't cannulate the R arm AVF successfully so by mid 2014 they decided to switch to PD which was done late summer 2014.  In Jan 2015 he was admitted with FTT felt to be due to underdialysis on PD and PD was abandoned and he   . GERD (gastroesophageal reflux disease)   . Glomerulosclerosis, diabetic ([place])   . Heart murmur   . History of blood transfusion   . Hypertension    off of meds due to orthostatic hypotension  . Kidney transplant recipient   . Open wound of both legs with complication    Pt has had progressive wounds of both LE's including gangrene of the toes and patchy necrosis of the calves.  He has been treated at [facility] by Dr Jerline Pain with hyperbaric O2 5d per week.  He is getting Na Thiosulfate with HD for suspected calciphylaxis. Pt says doctor's aren't sure if these ulcers were diabetic ulcers or calciphylaxis.  He underwent bilat BKA on 07/30/13.  He says that the woun    Family History  Problem Relation Age of Onset  . Cancer Mother   . Cancer Father        prostate cancer  . Heart disease Maternal Grandfather   . Stroke Paternal Grandmother     Past Surgical History:  Procedure  Laterality Date  . AMPUTATION Bilateral 07/30/2013   Procedure: AMPUTATION BELOW KNEE;  Surgeon: Newt Minion, MD;  Location: [facility];  Service: Orthopedics;  Laterality: Bilateral;  Bilateral Below Knee Amputations  . AMPUTATION Bilateral 09/03/2013   Procedure: AMPUTATION BELOW KNEE;  Surgeon: Newt Minion, MD;  Location: [facility];  Service: Orthopedics;  Laterality: Bilateral;  Bilateral Below Knee Amputation Revision  . AV FISTULA PLACEMENT  06/07/2011   Procedure: ARTERIOVENOUS (AV) FISTULA CREATION;  Surgeon: Angelia Mould, MD;  Location: [facility];  Service: Vascular;  Laterality: Left;  . AV FISTULA PLACEMENT  06/18/2012   Procedure: ARTERIOVENOUS (AV) FISTULA CREATION;  Surgeon: Mal Misty, MD;  Location: [facility];  Service: Vascular;  Laterality: Right;  [place]  . BILIARY DIVERSION, EXTERNAL  2002  . BONE MARROW BIOPSY  2012  . CAPD INSERTION N/A 01/14/2013   Procedure: LAPAROSCOPIC INSERTION CONTINUOUS AMBULATORY PERITONEAL DIALYSIS  (CAPD) CATHETER;  Surgeon: Adin Hector, MD;  Location: [facility];  Service: General;  Laterality: N/A;  . CAPD REMOVAL N/A 07/09/2013   Procedure: CONTINUOUS AMBULATORY PERITONEAL DIALYSIS  (CAPD) CATHETER REMOVAL;  Surgeon: Adin Hector, MD;  Location: [place];  Service: General;  Laterality: N/A;  . COLONOSCOPY     Hx: of  . INSERTION OF DIALYSIS CATHETER  05/22/2012   Procedure: INSERTION OF DIALYSIS CATHETER;  Surgeon: Mal Misty, MD;  Location: [facility];  Service: Vascular;  Laterality: N/A;  right internal jugular vein  . IR ANGIO INTRA EXTRACRAN SEL COM CAROTID INNOMINATE BILAT MOD SED  10/28/2018  . IR ANGIO VERTEBRAL SEL SUBCLAVIAN INNOMINATE UNI R MOD SED  10/28/2018  . IR ANGIO VERTEBRAL SEL VERTEBRAL UNI L MOD SED  10/28/2018  . LIGATION OF ARTERIOVENOUS  FISTULA  05/22/2012   Procedure: LIGATION OF ARTERIOVENOUS  FISTULA;  Surgeon: Mal Misty, MD;  Location: [facility];  Service: Vascular;  Laterality: Left;  Left brachio-cephalic fistula  . LOWER  EXTREMITY ANGIOGRAM N/A 06/14/2013   Procedure: LOWER EXTREMITY ANGIOGRAM;  Surgeon: Angelia Mould, MD;  Location: [facility] CATH LAB;  Service: Cardiovascular;  Laterality: N/A;  . NEPHRECTOMY TRANSPLANTED ORGAN    . OTHER SURGICAL HISTORY     Cyst removed from back  . PORTACATH PLACEMENT  2002  . RADIOLOGY WITH ANESTHESIA N/A 10/28/2018   Procedure: IR WITH ANESTHESIA;  Surgeon: Radiologist, Medication, MD;  Location: [facility];  Service: Radiology;  Laterality: N/A;  . REMOVAL OF A DIALYSIS CATHETER    . RENAL BIOPSY, PERCUTANEOUS  2012  . STUMP REVISION Right 01/12/2020   Procedure: REVISION RIGHT BELOW KNEE AMPUTATION;  Surgeon: Newt Minion, MD;  Location: [facility];  Service: Orthopedics;  Laterality: Right;  . TONSILLECTOMY    . VASCULAR SURGERY    . WHIPPLE PROCEDURE  2002   duodenal ca, Dr. Harlow Asa   Social History   Occupational History  . Not on file  Tobacco Use  . Smoking status: Never Smoker  . Smokeless tobacco: Never Used  Vaping Use  . Vaping Use: Never used  Substance and Sexual Activity  . Alcohol use: No  . Drug use: No  . Sexual activity: Not on file

## 2020-02-16 ENCOUNTER — Ambulatory Visit: Payer: Managed Care, Other (non HMO) | Admitting: Rehabilitation

## 2020-02-18 ENCOUNTER — Ambulatory Visit: Payer: Managed Care, Other (non HMO)

## 2020-02-24 ENCOUNTER — Ambulatory Visit: Payer: Managed Care, Other (non HMO) | Admitting: Cardiovascular Disease

## 2020-02-28 ENCOUNTER — Ambulatory Visit (INDEPENDENT_AMBULATORY_CARE_PROVIDER_SITE_OTHER): Payer: Managed Care, Other (non HMO) | Admitting: Physician Assistant

## 2020-02-28 ENCOUNTER — Encounter: Payer: Self-pay | Admitting: Physician Assistant

## 2020-02-28 VITALS — Ht 58.5 in | Wt 145.0 lb

## 2020-02-28 DIAGNOSIS — M86261 Subacute osteomyelitis, right tibia and fibula: Secondary | ICD-10-CM

## 2020-02-28 NOTE — Progress Notes (Signed)
Office Visit Note   Patient: Paul [name]           Date of Birth: 09-15-1955           MRN: [id number] Visit Date: 02/28/2020              Requested by: [name], MD [address] [place],  [place] [address] PCP: [name], MD  Chief Complaint  Patient presents with  . Right Leg - Follow-up    Right revision below knee amputation 01/12/2020      HPI: This is a pleasant 64 year old gentleman who is 6 weeks status post revision right below-knee amputation.  He is making appointment with [employer] to have a new prosthetic molded.  He has no complaints and feels he is doing well  Assessment & Plan: Visit Diagnoses: No diagnosis found.  Plan: He will follow-up for final visit in 1 month.  He did have 1 retained suture which were removed without difficulty today.  Follow-Up Instructions: No follow-ups on file.   Ortho Exam  Patient is alert, oriented, no adenopathy, well-dressed, normal affect, normal respiratory effort. Right below-knee amputation completely healed surgical incision minimal swelling no erythema compartments are soft no cellulitis or signs of infection  Imaging: No results found. No images are attached to the encounter.  Labs: Lab Results  Component Value Date   HGBA1C 10.3 (H) 12/31/2019   HGBA1C 8.1 (H) 04/24/2019   HGBA1C 13.6 (H) 10/29/2018   ESRSEDRATE 75 (H) 06/05/2013   CRP 5.9 (H) 01/04/2020   REPTSTATUS 10/17/2019 FINAL 10/12/2019   GRAMSTAIN  05/24/2013    RARE WBC PRESENT,BOTH PMN AND MONONUCLEAR NO ORGANISMS SEEN Performed at [facility] Performed at [facility]  05/24/2013    RARE WBC PRESENT,BOTH PMN AND MONONUCLEAR NO ORGANISMS SEEN   CULT  10/12/2019    NO GROWTH 5 DAYS Performed at [facility], [place] [address] [place],  [address]    [place] 08/02/2014     Lab Results  Component Value Date   ALBUMIN 2.2 (L) 01/27/2020   ALBUMIN 2.4 (L)  01/24/2020   ALBUMIN 2.2 (L) 01/22/2020    Lab Results  Component Value Date   MG 2.0 01/09/2020   MG 2.0 01/05/2020   MG 2.3 01/04/2020   No results found for: VD25OH  No results found for: PREALBUMIN CBC EXTENDED Latest Ref Rng & Units 01/27/2020 01/24/2020 01/22/2020  WBC 4.0 - 10.5 K/uL 6.2 8.2 7.4  RBC 4.22 - 5.81 MIL/uL 2.72(L) 3.14(L) 3.04(L)  HGB 13.0 - 17.0 g/dL 8.2(L) 9.2(L) 8.8(L)  HCT 39 - 52 % 26.2(L) 30.2(L) 29.3(L)  PLT 150 - 400 K/uL 146(L) 138(L) 127(L)  NEUTROABS 1.7 - 7.7 K/uL - - -  LYMPHSABS 0.7 - 4.0 K/uL - - -     Body mass index is 29.79 kg/m.  Orders:  No orders of the defined types were placed in this encounter.  No orders of the defined types were placed in this encounter.    Procedures: No procedures performed  Clinical Data: No additional findings.  ROS:  All other systems negative, except as noted in the HPI. Review of Systems  Objective: Vital Signs: Ht 4' 10.5" (1.486 m)   Wt 145 lb (65.8 kg)   BMI 29.79 kg/m   Specialty Comments:  No specialty comments available.  PMFS History: Patient Active Problem List   Diagnosis Date Noted  . Labile blood glucose   .  Diabetic peripheral neuropathy (HCC)   . Anemia of chronic disease   . Right below-knee amputee (HCC) 01/18/2020  . Amputation stump infection (HCC)   . Subacute osteomyelitis of right tibia (HCC)   . Dehiscence of amputation stump (HCC)   . Sepsis (HCC) 01/04/2020  . Cellulitis 12/31/2019  . CKD stage 4 due to type 2 diabetes mellitus (HCC) 04/29/2019  . AKI (acute kidney injury) (HCC) 04/24/2019  . Hyperglycemia 04/24/2019  . Nausea and vomiting 04/24/2019  . Fever 04/23/2019  . Acute metabolic encephalopathy 11/02/2018  . Stroke (HCC) 10/28/2018  . Middle cerebral artery embolism, left 10/28/2018  . Metabolic acidosis 05/24/2018  . Dehydration 05/24/2018  . Stage II pressure ulcer (HCC) 05/24/2018  . Dyspnea 05/23/2018  . Acute respiratory failure with hypoxia  (HCC) 05/23/2018  . Abnormal cardiovascular stress test 12/29/2013  . Awaiting organ transplant 12/09/2013  . Leg ulcer (HCC) 11/17/2013  . Chronic kidney disease (CKD), stage V (HCC) 11/17/2013  . Diabetes (HCC) 11/17/2013  . Cancer of duodenum (HCC) 11/17/2013  . Dyslipidemia 11/17/2013  . Chronic kidney disease requiring chronic dialysis (HCC) 11/17/2013  . Hypercholesteremia 11/17/2013  . H/O malignant neoplasm 11/17/2013  . H/O: HTN (hypertension) 11/17/2013  . BP (high blood pressure) 11/17/2013  . Hypotension 11/17/2013  . Angiopathy, peripheral (HCC) 11/17/2013  . AF (paroxysmal atrial fibrillation) (HCC) 11/17/2013  . BKA stump complication (HCC) 09/03/2013  . S/P bilateral BKA (below knee amputation) (HCC) 08/03/2013  . S/P BKA (below knee amputation) bilateral (HCC) 07/30/2013  . Open wound of both legs with complication 07/30/2013  . Acute blood loss anemia 07/12/2013  . Syncope 07/09/2013  . Intolerance to CAPD peritoneal dialysis s/p CAPD cath removal 07/09/2013 07/05/2013  . Critical lower limb ischemia (HCC) 06/30/2013  . Extremity pain 06/30/2013  . Atherosclerosis of native arteries of the extremities with ulceration(440.23) 06/17/2013  . Gait disorder 05/31/2013  . Weakness 05/24/2013  . Depression 04/03/2013  . GERD (gastroesophageal reflux disease) 04/03/2013  . Atrial fibrillation (HCC) 04/02/2013  . DM (diabetes mellitus) (HCC) 12/28/2012  . Other complications due to renal dialysis device, implant, and graft 05/19/2012  . Diabetes mellitus, type 2 (HCC) 11/13/2011  . Anemia 07/02/2011  . ESRD on hemodialysis (HCC) 05/29/2011   Past Medical History:  Diagnosis Date  . A-fib (HCC)   . Anemia    esrd  . Calciphylaxis 05/2013   lower ext  . Cancer (HCC)    hx duodenal adenoCa 2002, resected  . Closed left arm fracture 1967  . Corneal abrasion, left   . Depression   . Diabetes mellitus    Type 1 iddm x 11 yrs  . Diabetic neuropathy (HCC)   . ESRD  on hemodialysis (HCC)    Pt has ESRD due to DM.  He had a L upper arm AVF prior to starting HD which was ligated due to L arm steal syndrome.  He started HD in Jan 2014 and did home HD.  The family couldn't cannulate the R arm AVF successfully so by mid 2014 they decided to switch to PD which was done late summer 2014.  In Jan 2015 he was admitted with FTT felt to be due to underdialysis on PD and PD was abandoned and he   . GERD (gastroesophageal reflux disease)   . Glomerulosclerosis, diabetic (HCC)   . Heart murmur   . History of blood transfusion   . Hypertension    off of meds due to orthostatic hypotension  . Kidney transplant  recipient   . Open wound of both legs with complication    Pt has had progressive wounds of both LE's including gangrene of the toes and patchy necrosis of the calves.  [facility] Jimmey Ralph with hyperbaric O2 5d per week.  He is getting Na Thiosulfate with HD for suspected calciphylaxis. Pt says doctor's aren't sure if these ulcers were diabetic ulcers or calciphylaxis.  He underwent bilat BKA on 07/30/13.  He says that the woun    Family History  Problem Relation Age of Onset  . Cancer Mother   . Cancer Father        prostate cancer  . Heart disease Maternal Grandfather   . Stroke Paternal Grandmother     Past Surgical History:  Procedure Laterality Date  . AMPUTATION Bilateral 07/30/2013   Procedure: AMPUTATION BELOW KNEE;  Surgeon: Nadara Mustard, MD;  [facility];  Service: Orthopedics;  Laterality: Bilateral;  Bilateral Below Knee Amputations  . AMPUTATION Bilateral 09/03/2013   Procedure: AMPUTATION BELOW KNEE;  Surgeon: Nadara Mustard, MD;  [facility];  Service: Orthopedics;  Laterality: Bilateral;  Bilateral Below Knee Amputation Revision  . AV FISTULA PLACEMENT  06/07/2011   Procedure: ARTERIOVENOUS (AV) FISTULA CREATION;  Surgeon: Chuck Hint, MD;  Location: [facility] OR;  Service: Vascular;  Laterality: Left;  . AV  FISTULA PLACEMENT  06/18/2012   Procedure: ARTERIOVENOUS (AV) FISTULA CREATION;  Surgeon: Pryor Ochoa, MD;  Location: [facility] OR;  Service: Vascular;  Laterality: Right;  [place]  . BILIARY DIVERSION, EXTERNAL  2002  . BONE MARROW BIOPSY  2012  . CAPD INSERTION N/A 01/14/2013   Procedure: LAPAROSCOPIC INSERTION CONTINUOUS AMBULATORY PERITONEAL DIALYSIS  (CAPD) CATHETER;  Surgeon: Ardeth Sportsman, MD;  [facility];  Service: General;  Laterality: N/A;  . CAPD REMOVAL N/A 07/09/2013   Procedure: CONTINUOUS AMBULATORY PERITONEAL DIALYSIS  (CAPD) CATHETER REMOVAL;  Surgeon: Ardeth Sportsman, MD;  [facility];  Service: General;  Laterality: N/A;  . COLONOSCOPY     Hx: of  . INSERTION OF DIALYSIS CATHETER  05/22/2012   Procedure: INSERTION OF DIALYSIS CATHETER;  Surgeon: Pryor Ochoa, MD;  [facility] OR;  Service: Vascular;  Laterality: N/A;  right internal jugular vein  . IR ANGIO INTRA EXTRACRAN SEL COM CAROTID INNOMINATE BILAT MOD SED  10/28/2018  . IR ANGIO VERTEBRAL SEL SUBCLAVIAN INNOMINATE UNI R MOD SED  10/28/2018  . IR ANGIO VERTEBRAL SEL VERTEBRAL UNI L MOD SED  10/28/2018  . LIGATION OF ARTERIOVENOUS  FISTULA  05/22/2012   Procedure: LIGATION OF ARTERIOVENOUS  FISTULA;  Surgeon: Pryor Ochoa, MD;  Location: [facility] OR;  Service: Vascular;  Laterality: Left;  Left brachio-cephalic fistula  . LOWER EXTREMITY ANGIOGRAM N/A 06/14/2013   Procedure: LOWER EXTREMITY ANGIOGRAM;  Surgeon: Chuck Hint, MD;  Location: [facility] CATH LAB;  Service: Cardiovascular;  Laterality: N/A;  . NEPHRECTOMY TRANSPLANTED ORGAN    . OTHER SURGICAL HISTORY     Cyst removed from back  . PORTACATH PLACEMENT  2002  . RADIOLOGY WITH ANESTHESIA N/A 10/28/2018   Procedure: IR WITH ANESTHESIA;  Surgeon: Radiologist, Medication, MD;  Location: [facility];  Service: Radiology;  Laterality: N/A;  . REMOVAL OF A DIALYSIS CATHETER    . RENAL BIOPSY, PERCUTANEOUS  2012  . STUMP REVISION Right 01/12/2020   Procedure: REVISION RIGHT  BELOW KNEE AMPUTATION;  Surgeon: Nadara Mustard, MD;  Location: [facility] OR;  Service: Orthopedics;  Laterality: Right;  . TONSILLECTOMY    . VASCULAR SURGERY    . WHIPPLE PROCEDURE  2002   duodenal ca, Dr. Harlow Asa   Social History   Occupational History  . Not on file  Tobacco Use  . Smoking status: Never Smoker  . Smokeless tobacco: Never Used  Vaping Use  . Vaping Use: Never used  Substance and Sexual Activity  . Alcohol use: No  . Drug use: No  . Sexual activity: Not on file

## 2020-03-08 NOTE — Progress Notes (Signed)
Cardiology Office Note:   Date:  03/10/2020  NAME:  Paul [name]    MRN: [id number] DOB:  [birth year]   PCP:  [name], MD  Cardiologist:  Evalina Field, MD   Referring MD: [name], MD   Chief Complaint  Patient presents with  . Follow-up   History of Present Illness:   Paul [name] is a 64 y.o. male with a hx of paroxysmal atrial fibrillation on Eliquis, PAD status post bilateral BKA, diabetes, ESRD status post renal transplant with failed transplant back on hemodialysis, stroke/TIA who presents for follow-up of atrial fibrillation. Recent admission 12/2019 for R leg osteomyelitis and developed Afib with RVR. Concerted back to NSR with amiodarone and was placed on brief course of amiodarone. He is maintaining sinus rhythm. He has a regular pulse on exam. He does have the murmur of mild aortic stenosis. He is recovered and completed rehab. Things are better. His A1c is significantly elevated but this was likely in the setting of osteomyelitis. He is complete antibiotics. He is back on hemodialysis Tuesday Thursdays and Saturdays. He has been maintained on Eliquis. He does have some minor bleeding and bruising at times but no major issues. Things seems to be doing well. He denies any chest pain, shortness of breath or palpitations. He had several syncopal events in the past. He had no further recurrences. I suspect these were all hypoglycemia related.  Problem List 1. ESRD s/p renal transplant -back on HD 01/2020 2. PAD s/p bilateral BKA 3. Stroke/TIA 4. Atrial fibrillation -paroxysmal -Afib recurrence 12/2019; placed on amiodarone with return to NSR 5. DM -A1c 10.3 6. HLD -T chol 108, HDL 49, LDL 48, TG 55 7. HTN 8. Mild aortic stenosis  Past Medical History: Past Medical History:  Diagnosis Date  . A-fib ([place])   . Anemia    esrd  . Calciphylaxis 05/2013   lower ext  . Cancer ([place])    hx duodenal adenoCa 2002, resected  . Closed left arm fracture 1967  .  Corneal abrasion, left   . Depression   . Diabetes mellitus    Type 1 iddm x 11 yrs  . Diabetic neuropathy ([place])   . ESRD on hemodialysis ([place])    Pt has ESRD due to DM.  He had a L upper arm AVF prior to starting HD which was ligated due to L arm steal syndrome.  He started HD in Jan 2014 and did home HD.  The family couldn't cannulate the R arm AVF successfully so by mid 2014 they decided to switch to PD which was done late summer 2014.  In Jan 2015 he was admitted with FTT felt to be due to underdialysis on PD and PD was abandoned and he   . GERD (gastroesophageal reflux disease)   . Glomerulosclerosis, diabetic ([place])   . Heart murmur   . History of blood transfusion   . Hypertension    off of meds due to orthostatic hypotension  . Kidney transplant recipient   . Open wound of both legs with complication    Pt has had progressive wounds of both LE's including gangrene of the toes and patchy necrosis of the calves.  He has been treated at [facility] by Dr Jerline Pain with hyperbaric O2 5d per week.  He is getting Na Thiosulfate with HD for suspected calciphylaxis. Pt says doctor's aren't sure if these ulcers were diabetic ulcers or calciphylaxis.  He underwent bilat BKA on 07/30/13.  He says that  the woun    Past Surgical History: Past Surgical History:  Procedure Laterality Date  . AMPUTATION Bilateral 07/30/2013   Procedure: AMPUTATION BELOW KNEE;  Surgeon: Newt Minion, MD;  Location: [place];  Service: Orthopedics;  Laterality: Bilateral;  Bilateral Below Knee Amputations  . AMPUTATION Bilateral 09/03/2013   Procedure: AMPUTATION BELOW KNEE;  Surgeon: Newt Minion, MD;  Location: [place];  Service: Orthopedics;  Laterality: Bilateral;  Bilateral Below Knee Amputation Revision  . AV FISTULA PLACEMENT  06/07/2011   Procedure: ARTERIOVENOUS (AV) FISTULA CREATION;  Surgeon: Angelia Mould, MD;  Location: [place];  Service: Vascular;  Laterality: Left;  . AV FISTULA PLACEMENT  06/18/2012    Procedure: ARTERIOVENOUS (AV) FISTULA CREATION;  Surgeon: Mal Misty, MD;  Location: [place];  Service: Vascular;  Laterality: Right;  [place]  . BILIARY DIVERSION, EXTERNAL  2002  . BONE MARROW BIOPSY  2012  . CAPD INSERTION N/A 01/14/2013   Procedure: LAPAROSCOPIC INSERTION CONTINUOUS AMBULATORY PERITONEAL DIALYSIS  (CAPD) CATHETER;  Surgeon: Adin Hector, MD;  Location: [place];  Service: General;  Laterality: N/A;  . CAPD REMOVAL N/A 07/09/2013   Procedure: CONTINUOUS AMBULATORY PERITONEAL DIALYSIS  (CAPD) CATHETER REMOVAL;  Surgeon: Adin Hector, MD;  Location: [place];  Service: General;  Laterality: N/A;  . COLONOSCOPY     Hx: of  . INSERTION OF DIALYSIS CATHETER  05/22/2012   Procedure: INSERTION OF DIALYSIS CATHETER;  Surgeon: Mal Misty, MD;  Location: [place];  Service: Vascular;  Laterality: N/A;  right internal jugular vein  . IR ANGIO INTRA EXTRACRAN SEL COM CAROTID INNOMINATE BILAT MOD SED  10/28/2018  . IR ANGIO VERTEBRAL SEL SUBCLAVIAN INNOMINATE UNI R MOD SED  10/28/2018  . IR ANGIO VERTEBRAL SEL VERTEBRAL UNI L MOD SED  10/28/2018  . LIGATION OF ARTERIOVENOUS  FISTULA  05/22/2012   Procedure: LIGATION OF ARTERIOVENOUS  FISTULA;  Surgeon: Mal Misty, MD;  Location: [place];  Service: Vascular;  Laterality: Left;  Left brachio-cephalic fistula  . LOWER EXTREMITY ANGIOGRAM N/A 06/14/2013   Procedure: LOWER EXTREMITY ANGIOGRAM;  Surgeon: Angelia Mould, MD;  Location: [facility] CATH LAB;  Service: Cardiovascular;  Laterality: N/A;  . NEPHRECTOMY TRANSPLANTED ORGAN    . OTHER SURGICAL HISTORY     Cyst removed from back  . PORTACATH PLACEMENT  2002  . RADIOLOGY WITH ANESTHESIA N/A 10/28/2018   Procedure: IR WITH ANESTHESIA;  Surgeon: Radiologist, Medication, MD;  Location: [place];  Service: Radiology;  Laterality: N/A;  . REMOVAL OF A DIALYSIS CATHETER    . RENAL BIOPSY, PERCUTANEOUS  2012  . STUMP REVISION Right 01/12/2020   Procedure: REVISION RIGHT BELOW KNEE AMPUTATION;   Surgeon: Newt Minion, MD;  Location: [place];  Service: Orthopedics;  Laterality: Right;  . TONSILLECTOMY    . VASCULAR SURGERY    . WHIPPLE PROCEDURE  2002   duodenal ca, Dr. Harlow Asa    Current Medications: No outpatient medications have been marked as taking for the 03/10/20 encounter (Office Visit) with Geralynn Rile, MD.     Allergies:    Metformin and related   Social History: Social History   Socioeconomic History  . Marital status: Married    Spouse name: Not on file  . Number of children: Not on file  . Years of education: Not on file  . Highest education level: Not on file  Occupational History  . Not on file  Tobacco Use  . Smoking status: Never  Smoker  . Smokeless tobacco: Never Used  Vaping Use  . Vaping Use: Never used  Substance and Sexual Activity  . Alcohol use: No  . Drug use: No  . Sexual activity: Not on file  Other Topics Concern  . Not on file  Social History Narrative  . Not on file   Social Determinants of Health   Financial Resource Strain:   . Difficulty of Paying Living Expenses: Not on file  Food Insecurity:   . Worried About Charity fundraiser in the Last Year: Not on file  . Ran Out of Food in the Last Year: Not on file  Transportation Needs:   . Lack of Transportation (Medical): Not on file  . Lack of Transportation (Non-Medical): Not on file  Physical Activity:   . Days of Exercise per Week: Not on file  . Minutes of Exercise per Session: Not on file  Stress:   . Feeling of Stress : Not on file  Social Connections:   . Frequency of Communication with Friends and Family: Not on file  . Frequency of Social Gatherings with Friends and Family: Not on file  . Attends Religious Services: Not on file  . Active Member of Clubs or Organizations: Not on file  . Attends Archivist Meetings: Not on file  . Marital Status: Not on file     Family History: The patient's family history includes Cancer in his father and  mother; Heart disease in his maternal grandfather; Stroke in his paternal grandmother.  ROS:   All other ROS reviewed and negative. Pertinent positives noted in the HPI.     EKGs/Labs/Other Studies Reviewed:   The following studies were personally reviewed by me today:  TTE 01/28/2019 1. The left ventricle has normal systolic function with an ejection  fraction of 60-65%. The cavity size was normal. Left ventricular diastolic  Doppler parameters are consistent with impaired relaxation.  2. The right ventricle has normal systolic function. The cavity was  normal. There is no increase in right ventricular wall thickness.  3. The aortic valve is tricuspid. Mild thickening of the aortic valve.  Mild calcification of the aortic valve. Mild stenosis of the aortic valve.  4. Peak and mean gradients through the AV are 23 and 12 mm Hg  respectively consistent with mild AS. Since echo from Jan 2020, gradients  are mildly increased.   Recent Labs: 10/12/2019: B Natriuretic Peptide 1,980.9 01/05/2020: ALT 13 01/09/2020: Magnesium 2.0 01/27/2020: BUN 69; Creatinine, Ser 3.55; Hemoglobin 8.2; Platelets 146; Potassium 4.7; Sodium 134   Recent Lipid Panel    Component Value Date/Time   CHOL 108 10/29/2018 0530   TRIG 57 10/30/2018 0530   HDL 49 10/29/2018 0530   CHOLHDL 2.2 10/29/2018 0530   VLDL 11 10/29/2018 0530   LDLCALC 48 10/29/2018 0530    Physical Exam:   VS:  BP (!) 152/60   Pulse 68   Ht 4' 10" (1.473 m)   Wt 145 lb (65.8 kg)   SpO2 98%   BMI 30.31 kg/m    Wt Readings from Last 3 Encounters:  03/10/20 145 lb (65.8 kg)  02/28/20 145 lb (65.8 kg)  02/14/20 145 lb (65.8 kg)    General: Well nourished, well developed, in no acute distress Heart: Atraumatic, normal size  Eyes: PEERLA, EOMI  Neck: Supple, no JVD Endocrine: No thryomegaly Cardiac: Normal S1, S2; RRR; 2 out of 6 systolic ejection murmur Lungs: Clear to auscultation bilaterally, no wheezing, rhonchi  or rales    Abd: Soft, nontender, no hepatomegaly  Ext: No edema, pulses 2+ Musculoskeletal: No deformities, BUE and BLE strength normal and equal Skin: Warm and dry, no rashes   Neuro: Alert and oriented to person, place, time, and situation, CNII-XII grossly intact, no focal deficits  Psych: Normal mood and affect   ASSESSMENT:   Paul [name] is a 64 y.o. male who presents for the following: 1. Paroxysmal atrial fibrillation (HCC)   2. PAD (peripheral artery disease) ([place])   3. Essential hypertension   4. Nonrheumatic aortic valve stenosis     PLAN:   1. Paroxysmal atrial fibrillation (HCC) -History of A. fib. Had recurrence in August 2021. This was in the setting of sepsis and osteomyelitis. He was placed briefly on amiodarone with return to sinus rhythm. He can stop his amiodarone moving forward. He has a regular pulse on examination. He is in normal sinus rhythm. -He does have a high chads vas score. I recommended he continue on Eliquis. No major bleeding events. He does have PAD and stroke he will need to remain on baby aspirin.  2. PAD (peripheral artery disease) (HCC) -Most recent LDL cholesterol 40. He will continue a statin. On aspirin.  3. Essential hypertension -Back on hemodialysis. Blood pressure bit elevated today. Largely will be controlled by dialysis.  4. Nonrheumatic aortic valve stenosis -Mild aortic stenosis. Repeat echocardiogram in 3 to 5 years  Disposition: Return in about 6 months (around 09/08/2020).  Medication Adjustments/Labs and Tests Ordered: Current medicines are reviewed at length with the patient today.  Concerns regarding medicines are outlined above.  No orders of the defined types were placed in this encounter.  No orders of the defined types were placed in this encounter.   Patient Instructions  Medication Instructions:  Stop Amiodarone Continue all other medications  *If you need a refill on your cardiac medications before your next appointment,  please call your pharmacy*   Lab Work: None ordered  Testing/Procedures: None ordered   Follow-Up: At [facility], you and your health needs are our priority.  As part of our continuing mission to provide you with exceptional heart care, we have created designated Provider Care Teams.  These Care Teams include your primary Cardiologist (physician) and Advanced Practice Providers (APPs -  Physician Assistants and Nurse Practitioners) who all work together to provide you with the care you need, when you need it.  We recommend signing up for the patient portal called "MyChart".  Sign up information is provided on this After Visit Summary.  MyChart is used to connect with patients for Virtual Visits (Telemedicine).  Patients are able to view lab/test results, encounter notes, upcoming appointments, etc.  Non-urgent messages can be sent to your provider as well.   To learn more about what you can do with MyChart, go to [email or web address].    Your next appointment:  6 months  Call in Feb to schedule April appointment   The format for your next appointment: Office   Provider:  Dr.O'Neal      Time Spent with Patient: I have spent a total of 25 minutes with patient reviewing hospital notes, telemetry, EKGs, labs and examining the patient as well as establishing an assessment and plan that was discussed with the patient.  > 50% of time was spent in direct patient care.  Signed, Addison Naegeli. Audie Box, [place]  [address] [place], [place] [address] [phone]  03/10/2020 9:32 AM

## 2020-03-10 ENCOUNTER — Other Ambulatory Visit: Payer: Self-pay

## 2020-03-10 ENCOUNTER — Ambulatory Visit (INDEPENDENT_AMBULATORY_CARE_PROVIDER_SITE_OTHER): Payer: Medicare Other | Admitting: Cardiovascular Disease

## 2020-03-10 ENCOUNTER — Encounter: Payer: Self-pay | Admitting: Cardiovascular Disease

## 2020-03-10 VITALS — BP 152/60 | HR 68 | Ht <= 58 in | Wt 145.0 lb

## 2020-03-10 DIAGNOSIS — I739 Peripheral vascular disease, unspecified: Secondary | ICD-10-CM | POA: Diagnosis not present

## 2020-03-10 DIAGNOSIS — I10 Essential (primary) hypertension: Secondary | ICD-10-CM

## 2020-03-10 DIAGNOSIS — I480 Paroxysmal atrial fibrillation: Secondary | ICD-10-CM

## 2020-03-10 DIAGNOSIS — I350 Nonrheumatic aortic (valve) stenosis: Secondary | ICD-10-CM

## 2020-03-10 NOTE — Patient Instructions (Signed)
Medication Instructions:  Stop Amiodarone Continue all other medications  *If you need a refill on your cardiac medications before your next appointment, please call your pharmacy*   Lab Work: None ordered  Testing/Procedures: None ordered   Follow-Up: At [facility], you and your health needs are our priority.  As part of our continuing mission to provide you with exceptional heart care, we have created designated Provider Care Teams.  These Care Teams include your primary Cardiologist (physician) and Advanced Practice Providers (APPs -  Physician Assistants and Nurse Practitioners) who all work together to provide you with the care you need, when you need it.  We recommend signing up for the patient portal called "MyChart".  Sign up information is provided on this After Visit Summary.  MyChart is used to connect with patients for Virtual Visits (Telemedicine).  Patients are able to view lab/test results, encounter notes, upcoming appointments, etc.  Non-urgent messages can be sent to your provider as well.   To learn more about what you can do with MyChart, go to [email or web address].    Your next appointment:  6 months  Call in Feb to schedule April appointment   The format for your next appointment: Office   Provider:  Dr.O'Neal

## 2020-03-27 ENCOUNTER — Encounter: Payer: Self-pay | Admitting: Physician Assistant

## 2020-03-27 ENCOUNTER — Ambulatory Visit (INDEPENDENT_AMBULATORY_CARE_PROVIDER_SITE_OTHER): Payer: Managed Care, Other (non HMO) | Admitting: Physician Assistant

## 2020-03-27 DIAGNOSIS — Z89511 Acquired absence of right leg below knee: Secondary | ICD-10-CM

## 2020-03-27 DIAGNOSIS — Z89512 Acquired absence of left leg below knee: Secondary | ICD-10-CM

## 2020-03-27 NOTE — Progress Notes (Signed)
Office Visit Note   Patient: Paul [name]           Date of Birth: 11-15-1955           MRN: [id number] Visit Date: 03/27/2020              Requested by: [name], MD [address],  [place] [id number] PCP: [name], MD  Chief Complaint  Patient presents with  . Right Leg - Routine Post Op    01/12/20 revision right BKA       HPI: Presents today he is 2-1/2 months status post revision right below-knee amputation.  He is also status post left below-knee amputation.  He is feeling well and has no questions he has his prosthetic.  He is asking for a prescription for physical therapy  Assessment & Plan: Visit Diagnoses:  1. S/P bilateral below knee amputation ([name])     Plan: May follow-up as needed.  Prescription provided  Follow-Up Instructions: No follow-ups on file.   Ortho Exam  Patient is alert, oriented, no adenopathy, well-dressed, normal affect, normal respiratory effort. Bilateral below-knee amputation stumps right below-knee amputation completely healed prosthetic is working well swelling is well controlled no cellulitis no open areas of skin in excellent condition  Imaging: No results found. No images are attached to the encounter.  Labs: Lab Results  Component Value Date   HGBA1C 10.3 (H) 12/31/2019   HGBA1C 8.1 (H) 04/24/2019   HGBA1C 13.6 (H) 10/29/2018   ESRSEDRATE 75 (H) 06/05/2013   CRP 5.9 (H) 01/04/2020   REPTSTATUS 10/17/2019 FINAL 10/12/2019   GRAMSTAIN  05/24/2013    RARE WBC PRESENT,BOTH PMN AND MONONUCLEAR NO ORGANISMS SEEN Performed at [facility] Performed at [facility]  05/24/2013    RARE WBC PRESENT,BOTH PMN AND MONONUCLEAR NO ORGANISMS SEEN   CULT  10/12/2019    NO GROWTH 5 DAYS Performed at [facility], [place] [address] [place], [place] [address]    [place] 08/02/2014     Lab Results  Component Value Date   ALBUMIN 2.2 (L)  01/27/2020   ALBUMIN 2.4 (L) 01/24/2020   ALBUMIN 2.2 (L) 01/22/2020    Lab Results  Component Value Date   MG 2.0 01/09/2020   MG 2.0 01/05/2020   MG 2.3 01/04/2020   No results found for: VD25OH  No results found for: PREALBUMIN CBC EXTENDED Latest Ref Rng & Units 01/27/2020 01/24/2020 01/22/2020  WBC 4.0 - 10.5 K/uL 6.2 8.2 7.4  RBC 4.22 - 5.81 MIL/uL 2.72(L) 3.14(L) 3.04(L)  HGB 13.0 - 17.0 g/dL 8.2(L) 9.2(L) 8.8(L)  HCT 39 - 52 % 26.2(L) 30.2(L) 29.3(L)  PLT 150 - 400 K/uL 146(L) 138(L) 127(L)  NEUTROABS 1.7 - 7.7 K/uL - - -  LYMPHSABS 0.7 - 4.0 K/uL - - -     There is no height or weight on file to calculate BMI.  Orders:  Orders Placed This Encounter  Procedures  . Ambulatory referral to Physical Therapy   No orders of the defined types were placed in this encounter.    Procedures: No procedures performed  Clinical Data: No additional findings.  ROS:  All other systems negative, except as noted in the HPI. Review of Systems  Objective: Vital Signs: There were no vitals taken for this visit.  Specialty Comments:  No specialty comments available.  PMFS History: Patient Active Problem List   Diagnosis Date Noted  . Labile  blood glucose   . Diabetic peripheral neuropathy ([place])   . Anemia of chronic disease   . Right below-knee amputee ([name]) 01/18/2020  . Amputation stump infection ([name])   . Subacute osteomyelitis of right tibia ([name])   . Dehiscence of amputation stump ([name])   . Sepsis ([name]) 01/04/2020  . Cellulitis 12/31/2019  . CKD stage 4 due to type 2 diabetes mellitus ([name]) 04/29/2019  . AKI (acute kidney injury) ([name]) 04/24/2019  . Hyperglycemia 04/24/2019  . Nausea and vomiting 04/24/2019  . Fever 04/23/2019  . Acute metabolic encephalopathy 35/45/6256  . Stroke ([place]) 10/28/2018  . Middle cerebral artery embolism, left 10/28/2018  . Metabolic acidosis 38/93/7342  . Dehydration 05/24/2018  . Stage II pressure ulcer ([place]) 05/24/2018  .  Dyspnea 05/23/2018  . Acute respiratory failure with hypoxia ([place]) 05/23/2018  . Abnormal cardiovascular stress test 12/29/2013  . Awaiting organ transplant 12/09/2013  . Leg ulcer ([place]) 11/17/2013  . Chronic kidney disease (CKD), stage V ([name]) 11/17/2013  . Diabetes ([name]) 11/17/2013  . Cancer of duodenum ([name]) 11/17/2013  . Dyslipidemia 11/17/2013  . Chronic kidney disease requiring chronic dialysis ([name]) 11/17/2013  . Hypercholesteremia 11/17/2013  . H/O malignant neoplasm 11/17/2013  . H/O: HTN (hypertension) 11/17/2013  . BP (high blood pressure) 11/17/2013  . Hypotension 11/17/2013  . Angiopathy, peripheral ([name]) 11/17/2013  . AF (paroxysmal atrial fibrillation) ([name]) 11/17/2013  . BKA stump complication ([name]) 87/68/1157  . S/P bilateral BKA (below knee amputation) ([name]) 08/03/2013  . S/P BKA (below knee amputation) bilateral ([name]) 07/30/2013  . Open wound of both legs with complication 26/20/3559  . Acute blood loss anemia 07/12/2013  . Syncope 07/09/2013  . Intolerance to CAPD peritoneal dialysis s/p CAPD cath removal 07/09/2013 07/05/2013  . Critical lower limb ischemia ([name]) 06/30/2013  . Extremity pain 06/30/2013  . Atherosclerosis of native arteries of the extremities with ulceration(440.23) 06/17/2013  . Gait disorder 05/31/2013  . Weakness 05/24/2013  . Depression 04/03/2013  . GERD (gastroesophageal reflux disease) 04/03/2013  . Atrial fibrillation ([name]) 04/02/2013  . DM (diabetes mellitus) ([name]) 12/28/2012  . Other complications due to renal dialysis device, implant, and graft 05/19/2012  . Diabetes mellitus, type 2 ([name]) 11/13/2011  . Anemia 07/02/2011  . ESRD on hemodialysis ([place]) 05/29/2011   Past Medical History:  Diagnosis Date  . A-fib ([place])   . Anemia    esrd  . Calciphylaxis 05/2013   lower ext  . Cancer ([name])    hx duodenal adenoCa 2002, resected  . Closed left arm fracture 1967  . Corneal abrasion, left   . Depression   . Diabetes mellitus     Type 1 iddm x 11 yrs  . Diabetic neuropathy ([place])   . ESRD on hemodialysis ([name])    Pt has ESRD due to DM.  He had a L upper arm AVF prior to starting HD which was ligated due to L arm steal syndrome.  He started HD in Jan 2014 and did home HD.  The family couldn't cannulate the R arm AVF successfully so by mid 2014 they decided to switch to PD which was done late summer 2014.  In Jan 2015 he was admitted with FTT felt to be due to underdialysis on PD and PD was abandoned and he   . GERD (gastroesophageal reflux disease)   . Glomerulosclerosis, diabetic ([place])   . Heart murmur   . History of blood transfusion   . Hypertension    off of meds due to orthostatic  hypotension  . Kidney transplant recipient   . Open wound of both legs with complication    Pt has had progressive wounds of both LE's including gangrene of the toes and patchy necrosis of the calves.  He has been treated at [facility] by Dr Jerline Pain with hyperbaric O2 5d per week.  He is getting Na Thiosulfate with HD for suspected calciphylaxis. Pt says doctor's aren't sure if these ulcers were diabetic ulcers or calciphylaxis.  He underwent bilat BKA on 07/30/13.  He says that the woun    Family History  Problem Relation Age of Onset  . Cancer Mother   . Cancer Father        prostate cancer  . Heart disease Maternal Grandfather   . Stroke Paternal Grandmother     Past Surgical History:  Procedure Laterality Date  . AMPUTATION Bilateral 07/30/2013   Procedure: AMPUTATION BELOW KNEE;  Surgeon: Newt Minion, MD;  Location: [facility];  Service: Orthopedics;  Laterality: Bilateral;  Bilateral Below Knee Amputations  . AMPUTATION Bilateral 09/03/2013   Procedure: AMPUTATION BELOW KNEE;  Surgeon: Newt Minion, MD;  Location: [facility];  Service: Orthopedics;  Laterality: Bilateral;  Bilateral Below Knee Amputation Revision  . AV FISTULA PLACEMENT  06/07/2011   Procedure: ARTERIOVENOUS (AV) FISTULA CREATION;  Surgeon: Angelia Mould, MD;   Location: [facility];  Service: Vascular;  Laterality: Left;  . AV FISTULA PLACEMENT  06/18/2012   Procedure: ARTERIOVENOUS (AV) FISTULA CREATION;  Surgeon: Mal Misty, MD;  Location: [facility];  Service: Vascular;  Laterality: Right;  [place]  . BILIARY DIVERSION, EXTERNAL  2002  . BONE MARROW BIOPSY  2012  . CAPD INSERTION N/A 01/14/2013   Procedure: LAPAROSCOPIC INSERTION CONTINUOUS AMBULATORY PERITONEAL DIALYSIS  (CAPD) CATHETER;  Surgeon: Adin Hector, MD;  Location: [facility];  Service: General;  Laterality: N/A;  . CAPD REMOVAL N/A 07/09/2013   Procedure: CONTINUOUS AMBULATORY PERITONEAL DIALYSIS  (CAPD) CATHETER REMOVAL;  Surgeon: Adin Hector, MD;  Location: [facility];  Service: General;  Laterality: N/A;  . COLONOSCOPY     Hx: of  . INSERTION OF DIALYSIS CATHETER  05/22/2012   Procedure: INSERTION OF DIALYSIS CATHETER;  Surgeon: Mal Misty, MD;  Location: [facility];  Service: Vascular;  Laterality: N/A;  right internal jugular vein  . IR ANGIO INTRA EXTRACRAN SEL COM CAROTID INNOMINATE BILAT MOD SED  10/28/2018  . IR ANGIO VERTEBRAL SEL SUBCLAVIAN INNOMINATE UNI R MOD SED  10/28/2018  . IR ANGIO VERTEBRAL SEL VERTEBRAL UNI L MOD SED  10/28/2018  . LIGATION OF ARTERIOVENOUS  FISTULA  05/22/2012   Procedure: LIGATION OF ARTERIOVENOUS  FISTULA;  Surgeon: Mal Misty, MD;  Location: [facility];  Service: Vascular;  Laterality: Left;  Left brachio-cephalic fistula  . LOWER EXTREMITY ANGIOGRAM N/A 06/14/2013   Procedure: LOWER EXTREMITY ANGIOGRAM;  Surgeon: Angelia Mould, MD;  Location: [facility] CATH LAB;  Service: Cardiovascular;  Laterality: N/A;  . NEPHRECTOMY TRANSPLANTED ORGAN    . OTHER SURGICAL HISTORY     Cyst removed from back  . PORTACATH PLACEMENT  2002  . RADIOLOGY WITH ANESTHESIA N/A 10/28/2018   Procedure: IR WITH ANESTHESIA;  Surgeon: Radiologist, Medication, MD;  Location: [facility];  Service: Radiology;  Laterality: N/A;  . REMOVAL OF A DIALYSIS CATHETER    . RENAL BIOPSY, PERCUTANEOUS  2012    . STUMP REVISION Right 01/12/2020   Procedure: REVISION RIGHT BELOW KNEE AMPUTATION;  Surgeon: Newt Minion, MD;  Location: [facility];  Service: Orthopedics;  Laterality: Right;  . TONSILLECTOMY    . VASCULAR SURGERY    . WHIPPLE PROCEDURE  2002   duodenal ca, Dr. Harlow Asa   Social History   Occupational History  . Not on file  Tobacco Use  . Smoking status: Never Smoker  . Smokeless tobacco: Never Used  Vaping Use  . Vaping Use: Never used  Substance and Sexual Activity  . Alcohol use: No  . Drug use: No  . Sexual activity: Not on file

## 2020-05-23 ENCOUNTER — Telehealth: Payer: Self-pay | Admitting: Cardiovascular Disease

## 2020-05-23 NOTE — Telephone Encounter (Signed)
That is fine with me.   Paul [name] T. Audie Box, [place]  [address] [place], [place] [address] [phone]  5:17 PM

## 2020-05-23 NOTE — Telephone Encounter (Signed)
Called patient, he states that during dialysis he feels his heart racing more than normal- he states the nurses there get concerned as it reads in the 120's and higher at times during his visits there. He does not have many issues at home but he also does not have a way to check his HR at home when asked about his numbers there, but notices it more during his dialysis visits. No other symptoms at this time. Advised patient to monitor, call if he began having symptoms or if he had any other higher heart rates. Patient verbalized understanding. Advised I would route to MD to make sure he was okay with 1/10 appointment, as patient wanted to be seen sooner if needed.   Thanks!

## 2020-05-23 NOTE — Telephone Encounter (Signed)
STAT if HR is under 50 or over 120 (normal HR is 60-100 beats per minute)  1) What is your heart rate? Doesn't have current HR  2) Do you have a log of your heart rate readings (document readings)? 120  3) Do you have any other symptoms? No  Patient is scheduled to come in 05/29/20 due to dialysis being concerned with his elevated HR.

## 2020-05-28 NOTE — Progress Notes (Signed)
Cardiology Office Note:   Date:  05/29/2020  NAME:  Paul [name]    MRN: [id number] DOB:  [birth year]   PCP:  [name], MD  Cardiologist:  Evalina Field, MD   Referring MD: [name], MD   Chief Complaint  Patient presents with  . Tachycardia   History of Present Illness:   Paul [name] is a 65 y.o. male with a hx of pAF (recurrence 12/2019 in setting of septic shock), PAD, ESRD with failed renal transplant, CVA who presents for follow-up. Has had elevated HR at HD. Reports for the last 2 to 3 weeks he has had elevated heart rate with dialysis.  Blood pressure has also been severely elevated.  BP in office today 160/71.  He denies any chest pain or heart racing episodes.  He reports he has had palpitations as well when his heart rate is elevated.  He has a regular rate and rhythm today.  Seen by his primary care physician and EKG demonstrated normal sinus rhythm.  He had no recurrence of atrial fibrillation that I can tell.  He reports that he was on amlodipine while in the hospital but this was stopped in the setting of sepsis.  Blood pressure has been elevated and he request to go back on this.  His nephrologist did start him on metoprolol due to the rapid heartbeats.  He does need to wear a monitor.  Echocardiogram last year within normal limits.  Stress test in 2020 normal.  No bleeding on Eliquis.  Cholesterol at goal.  Problem List 1. ESRD s/p renal transplant -back on HD 01/2020 2. PAD s/p bilateral BKA 3. Stroke/TIA 4. Atrial fibrillation -paroxysmal -Afib recurrence 12/2019; placed on amiodarone with return to NSR -CHADSVASC = 6 (age, stroke, DM, HTN, PAD) 5. DM -A1c 10.3 6. HLD -T chol 108, HDL 49, LDL 48, TG 55 7. HTN 8. Mild aortic stenosis (01/28/2019)  Past Medical History: Past Medical History:  Diagnosis Date  . A-fib ([place])   . Anemia    esrd  . Calciphylaxis 05/2013   lower ext  . Cancer ([place])    hx duodenal adenoCa 2002, resected  . Closed left  arm fracture 1967  . Corneal abrasion, left   . Depression   . Diabetes mellitus    Type 1 iddm x 11 yrs  . Diabetic neuropathy ([place])   . ESRD on hemodialysis ([place])    Pt has ESRD due to DM.  He had a L upper arm AVF prior to starting HD which was ligated due to L arm steal syndrome.  He started HD in Jan 2014 and did home HD.  The family couldn't cannulate the R arm AVF successfully so by mid 2014 they decided to switch to PD which was done late summer 2014.  In Jan 2015 he was admitted with FTT felt to be due to underdialysis on PD and PD was abandoned and he   . GERD (gastroesophageal reflux disease)   . Glomerulosclerosis, diabetic ([place])   . Heart murmur   . History of blood transfusion   . Hypertension    off of meds due to orthostatic hypotension  . Kidney transplant recipient   . Open wound of both legs with complication    Pt has had progressive wounds of both LE's including gangrene of the toes and patchy necrosis of the calves.  He has been treated at [facility] by Dr Jerline Pain with hyperbaric O2 5d per week.  He is  getting Na Thiosulfate with HD for suspected calciphylaxis. Pt says doctor's aren't sure if these ulcers were diabetic ulcers or calciphylaxis.  He underwent bilat BKA on 07/30/13.  He says that the woun    Past Surgical History: Past Surgical History:  Procedure Laterality Date  . AMPUTATION Bilateral 07/30/2013   Procedure: AMPUTATION BELOW KNEE;  Surgeon: Newt Minion, MD;  Location: [place];  Service: Orthopedics;  Laterality: Bilateral;  Bilateral Below Knee Amputations  . AMPUTATION Bilateral 09/03/2013   Procedure: AMPUTATION BELOW KNEE;  Surgeon: Newt Minion, MD;  Location: [place];  Service: Orthopedics;  Laterality: Bilateral;  Bilateral Below Knee Amputation Revision  . AV FISTULA PLACEMENT  06/07/2011   Procedure: ARTERIOVENOUS (AV) FISTULA CREATION;  Surgeon: Angelia Mould, MD;  Location: [place];  Service: Vascular;  Laterality: Left;  . AV FISTULA  PLACEMENT  06/18/2012   Procedure: ARTERIOVENOUS (AV) FISTULA CREATION;  Surgeon: Mal Misty, MD;  Location: [place];  Service: Vascular;  Laterality: Right;  [place]  . BILIARY DIVERSION, EXTERNAL  2002  . BONE MARROW BIOPSY  2012  . CAPD INSERTION N/A 01/14/2013   Procedure: LAPAROSCOPIC INSERTION CONTINUOUS AMBULATORY PERITONEAL DIALYSIS  (CAPD) CATHETER;  Surgeon: Adin Hector, MD;  Location: [place];  Service: General;  Laterality: N/A;  . CAPD REMOVAL N/A 07/09/2013   Procedure: CONTINUOUS AMBULATORY PERITONEAL DIALYSIS  (CAPD) CATHETER REMOVAL;  Surgeon: Adin Hector, MD;  Location: [place];  Service: General;  Laterality: N/A;  . COLONOSCOPY     Hx: of  . INSERTION OF DIALYSIS CATHETER  05/22/2012   Procedure: INSERTION OF DIALYSIS CATHETER;  Surgeon: Mal Misty, MD;  Location: [place];  Service: Vascular;  Laterality: N/A;  right internal jugular vein  . IR ANGIO INTRA EXTRACRAN SEL COM CAROTID INNOMINATE BILAT MOD SED  10/28/2018  . IR ANGIO VERTEBRAL SEL SUBCLAVIAN INNOMINATE UNI R MOD SED  10/28/2018  . IR ANGIO VERTEBRAL SEL VERTEBRAL UNI L MOD SED  10/28/2018  . LIGATION OF ARTERIOVENOUS  FISTULA  05/22/2012   Procedure: LIGATION OF ARTERIOVENOUS  FISTULA;  Surgeon: Mal Misty, MD;  Location: [place];  Service: Vascular;  Laterality: Left;  Left brachio-cephalic fistula  . LOWER EXTREMITY ANGIOGRAM N/A 06/14/2013   Procedure: LOWER EXTREMITY ANGIOGRAM;  Surgeon: Angelia Mould, MD;  [facility] C[facility];  Service: Cardiovascular;  Laterality: N/A;  . NEPHRECTOMY TRANSPLANTED ORGAN    . OTHER SURGICAL HISTORY     Cyst removed from back  . PORTACATH PLACEMENT  2002  . RADIOLOGY WITH ANESTHESIA N/A 10/28/2018   Procedure: IR WITH ANESTHESIA;  Surgeon: Radiologist, Medication, MD;  Location: [place];  Service: Radiology;  Laterality: N/A;  . REMOVAL OF A DIALYSIS CATHETER    . RENAL BIOPSY, PERCUTANEOUS  2012  . STUMP REVISION Right 01/12/2020   Procedure: REVISION RIGHT BELOW  KNEE AMPUTATION;  Surgeon: Newt Minion, MD;  Location: [place];  Service: Orthopedics;  Laterality: Right;  . TONSILLECTOMY    . VASCULAR SURGERY    . WHIPPLE PROCEDURE  2002   duodenal ca, Dr. [place] Asa    Current Medications: Current Meds  Medication Sig  . acetaminophen (TYLENOL) 325 MG tablet Take 2 tablets (650 mg total) by mouth every 6 (six) hours as needed for mild pain (or Fever >/= 101).  [name] apixaban (ELIQUIS) 5 MG TABS tablet Take 1 tablet (5 mg total) by mouth 2 (two) times daily.  [name] aspirin 81 MG chewable tablet Chew  81 mg by mouth daily.  [name] atorvastatin (LIPITOR) 10 MG tablet Take 1 tablet (10 mg total) by mouth every Monday, Wednesday, and Friday.  . bisacodyl (DULCOLAX) 5 MG EC tablet Take 2 tablets (10 mg total) by mouth daily.  . CVS D3 50 MCG (2000 UT) CAPS Take 2 capsules (4,000 Units total) by mouth daily.  . Darbepoetin Alfa (ARANESP) 60 MCG/0.3ML SOSY injection Inject 0.3 mLs (60 mcg total) into the vein every Thursday with hemodialysis.  [name] docusate sodium (COLACE) 100 MG capsule Take 1 capsule (100 mg total) by mouth 2 (two) times daily.  . Insulin Glargine (BASAGLAR KWIKPEN) 100 UNIT/ML Inject 8 Units into the skin daily.  . lipase/protease/amylase (CREON) 12000-38000 units CPEP capsule Take 1 capsule (12,000 Units total) by mouth 3 (three) times daily before meals.  . magnesium chloride (SLOW-MAG) 64 MG TBEC SR tablet Take 2 tablets (128 mg total) by mouth 2 (two) times daily. Take at least 2 hours separate from tacrolimus.  . mycophenolate (MYFORTIC) 180 MG EC tablet Take 2 tablets (360 mg total) by mouth 2 (two) times daily.  [name] omeprazole (PRILOSEC) 40 MG capsule Take 1 capsule (40 mg total) by mouth at bedtime.  . polyethylene glycol (MIRALAX / GLYCOLAX) 17 g packet Take 17 g by mouth daily.  . predniSONE (DELTASONE) 5 MG tablet Take 1 tablet (5 mg total) by mouth daily with breakfast.  . sertraline (ZOLOFT) 50 MG tablet Take 1 tablet (50 mg total) by mouth at  bedtime.  . tacrolimus (PROGRAF) 1 MG capsule Take 2-3 mg by mouth See admin instructions. Take 3 mg by mouth in the morning at 9 AM and 2 mg in the evening at 9 PM  . tacrolimus (PROGRAF) 1 MG capsule Take 2 capsules (2 mg total) by mouth 2 (two) times daily.  . tamsulosin (FLOMAX) 0.4 MG CAPS capsule Take 1 capsule (0.4 mg total) by mouth daily after supper.  . [DISCONTINUED] amLODipine (NORVASC) 10 MG tablet Take 10 mg by mouth daily.  . [DISCONTINUED] metoprolol tartrate (LOPRESSOR) 25 MG tablet Take 25 mg by mouth 2 (two) times daily.     Allergies:    Metformin and related   Social History: Social History   Socioeconomic History  . Marital status: Married    Spouse name: Not on file  . Number of children: Not on file  . Years of education: Not on file  . Highest education level: Not on file  Occupational History  . Not on file  Tobacco Use  . Smoking status: Never Smoker  . Smokeless tobacco: Never Used  Vaping Use  . Vaping Use: Never used  Substance and Sexual Activity  . Alcohol use: No  . Drug use: No  . Sexual activity: Not on file  Other Topics Concern  . Not on file  Social History Narrative  . Not on file   Social Determinants of Health   Financial Resource Strain: Not on file  Food Insecurity: Not on file  Transportation Needs: Not on file  Physical Activity: Not on file  Stress: Not on file  Social Connections: Not on file     Family History: The patient's family history includes Cancer in his father and mother; Heart disease in his maternal grandfather; Stroke in his paternal grandmother.  ROS:   All other ROS reviewed and negative. Pertinent positives noted in the HPI.     EKGs/Labs/Other Studies Reviewed:   The following studies were personally reviewed by me today:  TTE  01/28/2019 1. The left ventricle has normal systolic function with an ejection  fraction of 60-65%. The cavity size was normal. Left ventricular diastolic  Doppler  parameters are consistent with impaired relaxation.  2. The right ventricle has normal systolic function. The cavity was  normal. There is no increase in right ventricular wall thickness.  3. The aortic valve is tricuspid. Mild thickening of the aortic valve.  Mild calcification of the aortic valve. Mild stenosis of the aortic valve.  4. Peak and mean gradients through the AV are 23 and 12 mm Hg  respectively consistent with mild AS. Since echo from Jan 2020, gradients  are mildly increased.   MPI 01/21/2019  Nuclear stress EF: 46%.  There was no ST segment deviation noted during stress.  The left ventricular ejection fraction is mildly decreased (45-54%).  Mild wall motion abnormalities of anterior and anterolateral wall on this imaging. Normal perfusion in this area. Would consider echo for formal evaluation of wall motion and EF.  No prior study for comparison.    Recent Labs: 10/12/2019: B Natriuretic Peptide 1,980.9 01/05/2020: ALT 13 01/09/2020: Magnesium 2.0 01/27/2020: BUN 69; Creatinine, Ser 3.55; Hemoglobin 8.2; Platelets 146; Potassium 4.7; Sodium 134   Recent Lipid Panel    Component Value Date/Time   CHOL 108 10/29/2018 0530   TRIG 57 10/30/2018 0530   HDL 49 10/29/2018 0530   CHOLHDL 2.2 10/29/2018 0530   VLDL 11 10/29/2018 0530   LDLCALC 48 10/29/2018 0530    Physical Exam:   VS:  BP (!) 160/71   Pulse 61   Temp (!) 97.3 F (36.3 C)   Ht 6' (1.829 m)   Wt 160 lb 12.8 oz (72.9 kg)   SpO2 91%   BMI 21.81 kg/m    Wt Readings from Last 3 Encounters:  05/29/20 160 lb 12.8 oz (72.9 kg)  03/10/20 145 lb (65.8 kg)  02/28/20 145 lb (65.8 kg)    General: Well nourished, well developed, in no acute distress Head: Atraumatic, normal size  Eyes: PEERLA, EOMI  Neck: Supple, no JVD Endocrine: No thryomegaly Cardiac: Normal S1, S2; regular rate and rhythm, 2 out of 6 systolic ejection murmur Lungs: Clear to auscultation bilaterally, no wheezing, rhonchi or  rales  Abd: Soft, nontender, no hepatomegaly  Ext: No edema, pulses 2+ Musculoskeletal: No deformities, BUE and BLE strength normal and equal Skin: Warm and dry, no rashes   Neuro: Alert and oriented to person, place, time, and situation, CNII-XII grossly intact, no focal deficits  Psych: Normal mood and affect   ASSESSMENT:   Paul [name] is a 65 y.o. male who presents for the following: 1. Tachycardia   2. Palpitations   3. Primary hypertension   4. Paroxysmal atrial fibrillation (HCC)   5. PAD (peripheral artery disease) (HCC)   6. Mixed hyperlipidemia   7. Aortic stenosis, mild     PLAN:   1. Tachycardia 2. Palpitations -Rapid heartbeat sensation with dialysis.  Unclear if this is an arrhythmia.  I recommended a TSH and CBC.  His pulse is regular and he is in normal rhythm today on exam.  I recommended a 7-day Zio patch to exclude return of atrial fibrillation.  He does have a history of paroxysmal A. fib which has occurred in the setting of sepsis.  3. Primary hypertension -BP elevated.  Restart Norvasc 10 mg a day.  Increase metoprolol to tartrate 25 twice a day.  4. Paroxysmal atrial fibrillation (HCC) --paroxysmal -Afib recurrence 12/2019; placed  on amiodarone with return to NSR -CHADSVASC = 6 (age, stroke, DM, HTN, PAD) -7-day Zio patch as above.  Continue Eliquis.  On metoprolol.  5. PAD (peripheral artery disease) ([place]) 6. Mixed hyperlipidemia -Status post bilateral BKA.  Cholesterol level at goal.  Continue aspirin and statin.  Most recent LDL cholesterol 40.  7. Aortic stenosis, mild -Repeat echocardiogram in 3 to 5 years.  Disposition: Return in about 3 months (around 08/27/2020).  Medication Adjustments/Labs and Tests Ordered: Current medicines are reviewed at length with the patient today.  Concerns regarding medicines are outlined above.  Orders Placed This Encounter  Procedures  . TSH  . CBC  . LONG TERM MONITOR (3-14 DAYS)   Meds ordered this  encounter  Medications  . amLODipine (NORVASC) 10 MG tablet    Sig: Take 1 tablet (10 mg total) by mouth daily.    Dispense:  90 tablet    Refill:  3  . metoprolol tartrate (LOPRESSOR) 25 MG tablet    Sig: Take 1 tablet (25 mg total) by mouth 2 (two) times daily.    Dispense:  180 tablet    Refill:  3    Patient Instructions  Medication Instructions:  START amlodipine $RemoveBeforeDE'10mg'ihTIaCHempSWRYI$  daily Continue all other current medications  *If you need a refill on your cardiac medications before your next appointment, please call your pharmacy*   Lab Work: CBC, TSH today   If you have labs (blood work) drawn today and your tests are completely normal, you will receive your results only by: [name] MyChart Message (if you have MyChart) OR . A paper copy in the mail If you have any lab test that is abnormal or we need to change your treatment, we will call you to review the results.   Testing/Procedures: Bryn Gulling- Long Term Monitor Instructions   Your physician has requested you wear your ZIO patch monitor 7 days.   This is a single patch monitor.  [employer] supplies one patch monitor per enrollment.  Additional stickers are not available.   Please do not apply patch if you will be having a Nuclear Stress Test, Echocardiogram, Cardiac CT, MRI, or Chest Xray during the time frame you would be wearing the monitor. The patch cannot be worn during these tests.  You cannot remove and re-apply the ZIO XT patch monitor.   Your ZIO patch monitor will be sent USPS Priority mail from [facility] directly to your home address. The monitor may also be mailed to a PO BOX if home delivery is not available.   It may take 3-5 days to receive your monitor after you have been enrolled.   Once you have received you monitor, please review enclosed instructions.  Your monitor has already been registered assigning a specific monitor serial # to you.   Applying the monitor   Shave hair from upper left chest.   Hold  abrader disc by orange tab.  Rub abrader in 40 strokes over left upper chest as indicated in your monitor instructions.   Clean area with 4 enclosed alcohol pads .  Use all pads to assure are is cleaned thoroughly.  Let dry.   Apply patch as indicated in monitor instructions.  Patch will be place under collarbone on left side of chest with arrow pointing upward.   Rub patch adhesive wings for 2 minutes.Remove white label marked "1".  Remove white label marked "2".  Rub patch adhesive wings for 2 additional minutes.   While looking in a mirror,  press and release button in center of patch.  A small green light will flash 3-4 times .  This will be your only indicator the monitor has been turned on.     Do not shower for the first 24 hours.  You may shower after the first 24 hours.   Press button if you feel a symptom. You will hear a small click.  Record Date, Time and Symptom in the Patient Log Book.   When you are ready to remove patch, follow instructions on last 2 pages of Patient Log Book.  Stick patch monitor onto last page of Patient Log Book.   Place Patient Log Book in [place] box.  Use locking tab on box and tape box closed securely.  The [employer] has [employer] on it.  Please place in mailbox as soon as possible.  Your physician should have your test results approximately 7 days after the monitor has been mailed back to [facility].   Call [place] at [phone] if you have questions regarding your ZIO XT patch monitor.  Call them immediately if you see an orange light blinking on your monitor.   If your monitor falls off in less than 4 days contact our Monitor department at [phone].  If your monitor becomes loose or falls off after 4 days call [employer] at [phone] for suggestions on securing your monitor.     Follow-Up: At [facility], you and your health needs are our priority.  As part of our continuing mission to provide you  with exceptional heart care, we have created designated Provider Care Teams.  These Care Teams include your primary Cardiologist (physician) and Advanced Practice Providers (APPs -  Physician Assistants and Nurse Practitioners) who all work together to provide you with the care you need, when you need it.  We recommend signing up for the patient portal called "MyChart".  Sign up information is provided on this After Visit Summary.  MyChart is used to connect with patients for Virtual Visits (Telemedicine).  Patients are able to view lab/test results, encounter notes, upcoming appointments, etc.  Non-urgent messages can be sent to your provider as well.   To learn more about what you can do with MyChart, go to [email or web address].    Your next appointment:   3 month(s)  The format for your next appointment:   In Person  Provider:   You may see Evalina Field, MD or one of the following Advanced Practice Providers on your designated Care Team:    Almyra Deforest, PA-C  Fabian Sharp, [place] or   Roby Lofts, [place]    Other Instructions  When you get your ZIO monitor, please call the office to coordinate with Almyra Free LPN about applying the monitor     Time Spent with Patient: I have spent a total of 35 minutes with patient reviewing hospital notes, telemetry, EKGs, labs and examining the patient as well as establishing an assessment and plan that was discussed with the patient.  > 50% of time was spent in direct patient care.  Signed, Addison Naegeli. Audie Box, [place]  [address] [place], [place] [address] [phone]  05/29/2020 1:21 PM

## 2020-05-29 ENCOUNTER — Ambulatory Visit: Payer: Managed Care, Other (non HMO)

## 2020-05-29 ENCOUNTER — Ambulatory Visit (INDEPENDENT_AMBULATORY_CARE_PROVIDER_SITE_OTHER): Payer: Managed Care, Other (non HMO) | Admitting: Cardiovascular Disease

## 2020-05-29 ENCOUNTER — Encounter: Payer: Self-pay | Admitting: Cardiovascular Disease

## 2020-05-29 ENCOUNTER — Encounter: Payer: Self-pay | Admitting: *Deleted

## 2020-05-29 ENCOUNTER — Other Ambulatory Visit: Payer: Self-pay

## 2020-05-29 VITALS — BP 160/71 | HR 61 | Temp 97.3°F | Ht 72.0 in | Wt 160.8 lb

## 2020-05-29 DIAGNOSIS — R000 Tachycardia, unspecified: Secondary | ICD-10-CM | POA: Diagnosis not present

## 2020-05-29 DIAGNOSIS — I350 Nonrheumatic aortic (valve) stenosis: Secondary | ICD-10-CM

## 2020-05-29 DIAGNOSIS — I480 Paroxysmal atrial fibrillation: Secondary | ICD-10-CM | POA: Diagnosis not present

## 2020-05-29 DIAGNOSIS — R002 Palpitations: Secondary | ICD-10-CM | POA: Diagnosis not present

## 2020-05-29 DIAGNOSIS — E782 Mixed hyperlipidemia: Secondary | ICD-10-CM

## 2020-05-29 DIAGNOSIS — I10 Essential (primary) hypertension: Secondary | ICD-10-CM | POA: Diagnosis not present

## 2020-05-29 DIAGNOSIS — I739 Peripheral vascular disease, unspecified: Secondary | ICD-10-CM

## 2020-05-29 MED ORDER — METOPROLOL TARTRATE 25 MG PO TABS
25.0000 mg | ORAL_TABLET | Freq: Two times a day (BID) | ORAL | 3 refills | Status: DC
Start: 2020-05-29 — End: 2020-07-20

## 2020-05-29 MED ORDER — AMLODIPINE BESYLATE 10 MG PO TABS
10.0000 mg | ORAL_TABLET | Freq: Every day | ORAL | 3 refills | Status: DC
Start: 2020-05-29 — End: 2020-07-20

## 2020-05-29 NOTE — Patient Instructions (Signed)
Medication Instructions:  START amlodipine 10mg  daily Continue all other current medications  *If you need a refill on your cardiac medications before your next appointment, please call your pharmacy*   Lab Work: CBC, TSH today   If you have labs (blood work) drawn today and your tests are completely normal, you will receive your results only by: [name] MyChart Message (if you have MyChart) OR . A paper copy in the mail If you have any lab test that is abnormal or we need to change your treatment, we will call you to review the results.   Testing/Procedures: Bryn Gulling- Long Term Monitor Instructions   Your physician has requested you wear your ZIO patch monitor 7 days.   This is a single patch monitor.  [employer] supplies one patch monitor per enrollment.  Additional stickers are not available.   Please do not apply patch if you will be having a Nuclear Stress Test, Echocardiogram, Cardiac CT, MRI, or Chest Xray during the time frame you would be wearing the monitor. The patch cannot be worn during these tests.  You cannot remove and re-apply the ZIO XT patch monitor.   Your ZIO patch monitor will be sent USPS Priority mail from [facility] directly to your home address. The monitor may also be mailed to a PO BOX if home delivery is not available.   It may take 3-5 days to receive your monitor after you have been enrolled.   Once you have received you monitor, please review enclosed instructions.  Your monitor has already been registered assigning a specific monitor serial # to you.   Applying the monitor   Shave hair from upper left chest.   Hold abrader disc by orange tab.  Rub abrader in 40 strokes over left upper chest as indicated in your monitor instructions.   Clean area with 4 enclosed alcohol pads .  Use all pads to assure are is cleaned thoroughly.  Let dry.   Apply patch as indicated in monitor instructions.  Patch will be place under collarbone on left side of chest  with arrow pointing upward.   Rub patch adhesive wings for 2 minutes.Remove white label marked "1".  Remove white label marked "2".  Rub patch adhesive wings for 2 additional minutes.   While looking in a mirror, press and release button in center of patch.  A small green light will flash 3-4 times .  This will be your only indicator the monitor has been turned on.     Do not shower for the first 24 hours.  You may shower after the first 24 hours.   Press button if you feel a symptom. You will hear a small click.  Record Date, Time and Symptom in the Patient Log Book.   When you are ready to remove patch, follow instructions on last 2 pages of Patient Log Book.  Stick patch monitor onto last page of Patient Log Book.   Place Patient Log Book in [place] box.  Use locking tab on box and tape box closed securely.  The [employer] has [employer] on it.  Please place in mailbox as soon as possible.  Your physician should have your test results approximately 7 days after the monitor has been mailed back to [facility].   Call [place] at [phone] if you have questions regarding your ZIO XT patch monitor.  Call them immediately if you see an orange light blinking on your monitor.   If  your monitor falls off in less than 4 days contact our Monitor department at [phone].  If your monitor becomes loose or falls off after 4 days call [employer] at [phone] for suggestions on securing your monitor.     Follow-Up: At [facility], you and your health needs are our priority.  As part of our continuing mission to provide you with exceptional heart care, we have created designated Provider Care Teams.  These Care Teams include your primary Cardiologist (physician) and Advanced Practice Providers (APPs -  Physician Assistants and Nurse Practitioners) who all work together to provide you with the care you need, when you need it.  We recommend signing up for the  patient portal called "MyChart".  Sign up information is provided on this After Visit Summary.  MyChart is used to connect with patients for Virtual Visits (Telemedicine).  Patients are able to view lab/test results, encounter notes, upcoming appointments, etc.  Non-urgent messages can be sent to your provider as well.   To learn more about what you can do with MyChart, go to [email or web address].    Your next appointment:   3 month(s)  The format for your next appointment:   In Person  Provider:   You may see Evalina Field, MD or one of the following Advanced Practice Providers on your designated Care Team:    Almyra Deforest, PA-C  Fabian Sharp, [place] or   Roby Lofts, [place]    Other Instructions  When you get your ZIO monitor, please call the office to coordinate with Almyra Free LPN about applying the monitor

## 2020-05-29 NOTE — Progress Notes (Signed)
Patient ID: Paul [name], male   DOB: [birth year], 65 y.o.   MRN: [id number] Patient enrolled for [employer] to ship a 7 day ZIO XT long term holter monitor to his home.

## 2020-05-30 LAB — CBC
Hematocrit: 35.3 % — ABNORMAL LOW (ref 37.5–51.0)
Hemoglobin: 11.3 g/dL — ABNORMAL LOW (ref 13.0–17.7)
MCH: 28 pg (ref 26.6–33.0)
MCHC: 32 g/dL (ref 31.5–35.7)
MCV: 88 fL (ref 79–97)
Platelets: 113 10*3/uL — ABNORMAL LOW (ref 150–450)
RBC: 4.03 x10E6/uL — ABNORMAL LOW (ref 4.14–5.80)
RDW: 16.1 % — ABNORMAL HIGH (ref 11.6–15.4)
WBC: 5 10*3/uL (ref 3.4–10.8)

## 2020-05-30 LAB — TSH: TSH: 3 u[IU]/mL (ref 0.450–4.500)

## 2020-06-11 ENCOUNTER — Emergency Department (HOSPITAL_COMMUNITY): Payer: Medicare Other

## 2020-06-11 ENCOUNTER — Inpatient Hospital Stay (HOSPITAL_COMMUNITY)
Admission: EM | Admit: 2020-06-11 | Discharge: 2020-07-20 | DRG: 871 | Disposition: A | Discharge status: Skilled Nursing Facility | Payer: Medicare Other | Attending: Internal Medicine | Admitting: Internal Medicine

## 2020-06-11 DIAGNOSIS — Z992 Dependence on renal dialysis: Secondary | ICD-10-CM | POA: Diagnosis not present

## 2020-06-11 DIAGNOSIS — E876 Hypokalemia: Secondary | ICD-10-CM | POA: Diagnosis present

## 2020-06-11 DIAGNOSIS — N400 Enlarged prostate without lower urinary tract symptoms: Secondary | ICD-10-CM | POA: Diagnosis present

## 2020-06-11 DIAGNOSIS — Y830 Surgical operation with transplant of whole organ as the cause of abnormal reaction of the patient, or of later complication, without mention of misadventure at the time of the procedure: Secondary | ICD-10-CM | POA: Diagnosis present

## 2020-06-11 DIAGNOSIS — R079 Chest pain, unspecified: Secondary | ICD-10-CM

## 2020-06-11 DIAGNOSIS — I5043 Acute on chronic combined systolic (congestive) and diastolic (congestive) heart failure: Secondary | ICD-10-CM | POA: Diagnosis present

## 2020-06-11 DIAGNOSIS — E1040 Type 1 diabetes mellitus with diabetic neuropathy, unspecified: Secondary | ICD-10-CM | POA: Diagnosis present

## 2020-06-11 DIAGNOSIS — Z79899 Other long term (current) drug therapy: Secondary | ICD-10-CM

## 2020-06-11 DIAGNOSIS — Z823 Family history of stroke: Secondary | ICD-10-CM

## 2020-06-11 DIAGNOSIS — D631 Anemia in chronic kidney disease: Secondary | ICD-10-CM | POA: Diagnosis present

## 2020-06-11 DIAGNOSIS — I132 Hypertensive heart and chronic kidney disease with heart failure and with stage 5 chronic kidney disease, or end stage renal disease: Secondary | ICD-10-CM | POA: Diagnosis present

## 2020-06-11 DIAGNOSIS — N186 End stage renal disease: Secondary | ICD-10-CM | POA: Diagnosis present

## 2020-06-11 DIAGNOSIS — I495 Sick sinus syndrome: Secondary | ICD-10-CM | POA: Diagnosis present

## 2020-06-11 DIAGNOSIS — F32A Depression, unspecified: Secondary | ICD-10-CM | POA: Diagnosis present

## 2020-06-11 DIAGNOSIS — N2581 Secondary hyperparathyroidism of renal origin: Secondary | ICD-10-CM | POA: Diagnosis present

## 2020-06-11 DIAGNOSIS — E1022 Type 1 diabetes mellitus with diabetic chronic kidney disease: Secondary | ICD-10-CM | POA: Diagnosis present

## 2020-06-11 DIAGNOSIS — I4819 Other persistent atrial fibrillation: Secondary | ICD-10-CM | POA: Diagnosis present

## 2020-06-11 DIAGNOSIS — J1282 Pneumonia due to coronavirus disease 2019: Secondary | ICD-10-CM | POA: Diagnosis present

## 2020-06-11 DIAGNOSIS — Z89512 Acquired absence of left leg below knee: Secondary | ICD-10-CM | POA: Diagnosis not present

## 2020-06-11 DIAGNOSIS — E1159 Type 2 diabetes mellitus with other circulatory complications: Secondary | ICD-10-CM | POA: Diagnosis not present

## 2020-06-11 DIAGNOSIS — I4891 Unspecified atrial fibrillation: Secondary | ICD-10-CM | POA: Diagnosis not present

## 2020-06-11 DIAGNOSIS — Y92239 Unspecified place in hospital as the place of occurrence of the external cause: Secondary | ICD-10-CM | POA: Diagnosis present

## 2020-06-11 DIAGNOSIS — G9341 Metabolic encephalopathy: Secondary | ICD-10-CM | POA: Diagnosis present

## 2020-06-11 DIAGNOSIS — L89152 Pressure ulcer of sacral region, stage 2: Secondary | ICD-10-CM | POA: Diagnosis present

## 2020-06-11 DIAGNOSIS — B952 Enterococcus as the cause of diseases classified elsewhere: Secondary | ICD-10-CM

## 2020-06-11 DIAGNOSIS — I5042 Chronic combined systolic (congestive) and diastolic (congestive) heart failure: Secondary | ICD-10-CM | POA: Diagnosis not present

## 2020-06-11 DIAGNOSIS — E10649 Type 1 diabetes mellitus with hypoglycemia without coma: Secondary | ICD-10-CM | POA: Diagnosis present

## 2020-06-11 DIAGNOSIS — D84821 Immunodeficiency due to drugs: Secondary | ICD-10-CM | POA: Diagnosis present

## 2020-06-11 DIAGNOSIS — I480 Paroxysmal atrial fibrillation: Secondary | ICD-10-CM

## 2020-06-11 DIAGNOSIS — N269 Renal sclerosis, unspecified: Secondary | ICD-10-CM | POA: Diagnosis present

## 2020-06-11 DIAGNOSIS — Z888 Allergy status to other drugs, medicaments and biological substances status: Secondary | ICD-10-CM | POA: Diagnosis not present

## 2020-06-11 DIAGNOSIS — E1065 Type 1 diabetes mellitus with hyperglycemia: Secondary | ICD-10-CM | POA: Diagnosis not present

## 2020-06-11 DIAGNOSIS — Z89511 Acquired absence of right leg below knee: Secondary | ICD-10-CM

## 2020-06-11 DIAGNOSIS — K219 Gastro-esophageal reflux disease without esophagitis: Secondary | ICD-10-CM | POA: Diagnosis present

## 2020-06-11 DIAGNOSIS — Z8249 Family history of ischemic heart disease and other diseases of the circulatory system: Secondary | ICD-10-CM

## 2020-06-11 DIAGNOSIS — R0602 Shortness of breath: Secondary | ICD-10-CM | POA: Diagnosis present

## 2020-06-11 DIAGNOSIS — R1310 Dysphagia, unspecified: Secondary | ICD-10-CM | POA: Diagnosis present

## 2020-06-11 DIAGNOSIS — Z8042 Family history of malignant neoplasm of prostate: Secondary | ICD-10-CM

## 2020-06-11 DIAGNOSIS — I255 Ischemic cardiomyopathy: Secondary | ICD-10-CM | POA: Diagnosis present

## 2020-06-11 DIAGNOSIS — I4892 Unspecified atrial flutter: Secondary | ICD-10-CM | POA: Diagnosis present

## 2020-06-11 DIAGNOSIS — E119 Type 2 diabetes mellitus without complications: Secondary | ICD-10-CM

## 2020-06-11 DIAGNOSIS — I5031 Acute diastolic (congestive) heart failure: Secondary | ICD-10-CM

## 2020-06-11 DIAGNOSIS — Z8673 Personal history of transient ischemic attack (TIA), and cerebral infarction without residual deficits: Secondary | ICD-10-CM

## 2020-06-11 DIAGNOSIS — Z7982 Long term (current) use of aspirin: Secondary | ICD-10-CM

## 2020-06-11 DIAGNOSIS — T8612 Kidney transplant failure: Secondary | ICD-10-CM | POA: Diagnosis present

## 2020-06-11 DIAGNOSIS — R7881 Bacteremia: Secondary | ICD-10-CM

## 2020-06-11 DIAGNOSIS — E1051 Type 1 diabetes mellitus with diabetic peripheral angiopathy without gangrene: Secondary | ICD-10-CM | POA: Diagnosis present

## 2020-06-11 DIAGNOSIS — Z85068 Personal history of other malignant neoplasm of small intestine: Secondary | ICD-10-CM

## 2020-06-11 DIAGNOSIS — J9601 Acute respiratory failure with hypoxia: Secondary | ICD-10-CM | POA: Diagnosis present

## 2020-06-11 DIAGNOSIS — R531 Weakness: Secondary | ICD-10-CM

## 2020-06-11 DIAGNOSIS — U071 COVID-19: Secondary | ICD-10-CM | POA: Diagnosis present

## 2020-06-11 DIAGNOSIS — T380X5A Adverse effect of glucocorticoids and synthetic analogues, initial encounter: Secondary | ICD-10-CM | POA: Diagnosis not present

## 2020-06-11 DIAGNOSIS — Z794 Long term (current) use of insulin: Secondary | ICD-10-CM | POA: Diagnosis not present

## 2020-06-11 DIAGNOSIS — E785 Hyperlipidemia, unspecified: Secondary | ICD-10-CM | POA: Diagnosis present

## 2020-06-11 DIAGNOSIS — Z7901 Long term (current) use of anticoagulants: Secondary | ICD-10-CM

## 2020-06-11 DIAGNOSIS — Z751 Person awaiting admission to adequate facility elsewhere: Secondary | ICD-10-CM

## 2020-06-11 DIAGNOSIS — I420 Dilated cardiomyopathy: Secondary | ICD-10-CM | POA: Diagnosis not present

## 2020-06-11 DIAGNOSIS — E7800 Pure hypercholesterolemia, unspecified: Secondary | ICD-10-CM | POA: Diagnosis present

## 2020-06-11 DIAGNOSIS — A4181 Sepsis due to Enterococcus: Principal | ICD-10-CM | POA: Diagnosis present

## 2020-06-11 DIAGNOSIS — Z7952 Long term (current) use of systemic steroids: Secondary | ICD-10-CM

## 2020-06-11 DIAGNOSIS — R001 Bradycardia, unspecified: Secondary | ICD-10-CM | POA: Diagnosis not present

## 2020-06-11 DIAGNOSIS — I5021 Acute systolic (congestive) heart failure: Secondary | ICD-10-CM | POA: Diagnosis not present

## 2020-06-11 LAB — CBC WITH DIFFERENTIAL/PLATELET
Abs Immature Granulocytes: 0.04 10*3/uL (ref 0.00–0.07)
Basophils Absolute: 0 10*3/uL (ref 0.0–0.1)
Basophils Relative: 0 %
Eosinophils Absolute: 0 10*3/uL (ref 0.0–0.5)
Eosinophils Relative: 0 %
HCT: 43.1 % (ref 39.0–52.0)
Hemoglobin: 12.4 g/dL — ABNORMAL LOW (ref 13.0–17.0)
Immature Granulocytes: 1 %
Lymphocytes Relative: 15 %
Lymphs Abs: 1.2 10*3/uL (ref 0.7–4.0)
MCH: 26.3 pg (ref 26.0–34.0)
MCHC: 28.8 g/dL — ABNORMAL LOW (ref 30.0–36.0)
MCV: 91.5 fL (ref 80.0–100.0)
Monocytes Absolute: 0.6 10*3/uL (ref 0.1–1.0)
Monocytes Relative: 8 %
Neutro Abs: 5.7 10*3/uL (ref 1.7–7.7)
Neutrophils Relative %: 76 %
Platelets: 101 10*3/uL — ABNORMAL LOW (ref 150–400)
RBC: 4.71 MIL/uL (ref 4.22–5.81)
RDW: 17.8 % — ABNORMAL HIGH (ref 11.5–15.5)
WBC: 7.6 10*3/uL (ref 4.0–10.5)
nRBC: 0 % (ref 0.0–0.2)

## 2020-06-11 LAB — COMPREHENSIVE METABOLIC PANEL
ALT: 11 U/L (ref 0–44)
AST: 23 U/L (ref 15–41)
Albumin: 2.6 g/dL — ABNORMAL LOW (ref 3.5–5.0)
Alkaline Phosphatase: 80 U/L (ref 38–126)
Anion gap: 16 — ABNORMAL HIGH (ref 5–15)
BUN: 36 mg/dL — ABNORMAL HIGH (ref 8–23)
CO2: 23 mmol/L (ref 22–32)
Calcium: 8.2 mg/dL — ABNORMAL LOW (ref 8.9–10.3)
Chloride: 101 mmol/L (ref 98–111)
Creatinine, Ser: 5.78 mg/dL — ABNORMAL HIGH (ref 0.61–1.24)
GFR, Estimated: 10 mL/min — ABNORMAL LOW (ref >60)
Glucose, Bld: 232 mg/dL — ABNORMAL HIGH (ref 70–99)
Potassium: 3.9 mmol/L (ref 3.5–5.1)
Sodium: 140 mmol/L (ref 135–145)
Total Bilirubin: 1.1 mg/dL (ref 0.3–1.2)
Total Protein: 6.4 g/dL — ABNORMAL LOW (ref 6.5–8.1)

## 2020-06-11 LAB — BLOOD GAS, ARTERIAL
Acid-base deficit: 0.7 mmol/L (ref 0.0–2.0)
Bicarbonate: 23.2 mmol/L (ref 20.0–28.0)
Drawn by: 252031
FIO2: 28
O2 Saturation: 93.9 %
Patient temperature: 37
pCO2 arterial: 36.6 mmHg (ref 32.0–48.0)
pH, Arterial: 7.419 (ref 7.350–7.450)
pO2, Arterial: 87.4 mmHg (ref 83.0–108.0)

## 2020-06-11 LAB — LACTATE DEHYDROGENASE: LDH: 255 U/L — ABNORMAL HIGH (ref 98–192)

## 2020-06-11 LAB — FIBRINOGEN: Fibrinogen: 399 mg/dL (ref 210–475)

## 2020-06-11 LAB — BRAIN NATRIURETIC PEPTIDE: B Natriuretic Peptide: >4500 pg/mL — ABNORMAL HIGH (ref 0.0–100.0)

## 2020-06-11 LAB — LACTIC ACID, PLASMA: Lactic Acid, Venous: 2.2 mmol/L (ref 0.5–1.9)

## 2020-06-11 LAB — D-DIMER, QUANTITATIVE: D-Dimer, Quant: 2.23 ug/mL-FEU — ABNORMAL HIGH (ref 0.00–0.50)

## 2020-06-11 LAB — FERRITIN: Ferritin: 740 ng/mL — ABNORMAL HIGH (ref 24–336)

## 2020-06-11 LAB — TRIGLYCERIDES: Triglycerides: 83 mg/dL (ref <150)

## 2020-06-11 LAB — C-REACTIVE PROTEIN: CRP: 15.3 mg/dL — ABNORMAL HIGH (ref <1.0)

## 2020-06-11 LAB — PROCALCITONIN: Procalcitonin: 0.44 ng/mL

## 2020-06-11 LAB — SARS CORONAVIRUS 2 BY RT PCR (HOSPITAL ORDER, PERFORMED IN ~~LOC~~ HOSPITAL LAB): SARS Coronavirus 2: POSITIVE — AB

## 2020-06-11 MED ORDER — TACROLIMUS 1 MG PO CAPS: 2.0000 mg | ORAL_CAPSULE | ORAL | Status: DC

## 2020-06-11 MED ORDER — PANCRELIPASE (LIP-PROT-AMYL) 12000-38000 UNITS PO CPEP
12000.0000 [IU] | ORAL_CAPSULE | Freq: Three times a day (TID) | ORAL | Status: DC
  Administered 2020-06-12 – 2020-07-20 (×107): 12000 [IU] via ORAL
  Filled 2020-06-11 (×109): qty 1

## 2020-06-11 MED ORDER — METOPROLOL TARTRATE 5 MG/5ML IV SOLN
5.0000 mg | Freq: Once | INTRAVENOUS | Status: AC
  Administered 2020-06-11: 5 mg via INTRAVENOUS
  Filled 2020-06-11: qty 5

## 2020-06-11 MED ORDER — METHYLPREDNISOLONE SODIUM SUCC 125 MG IJ SOLR
1.0000 mg/kg | Freq: Two times a day (BID) | INTRAMUSCULAR | Status: AC
  Administered 2020-06-11 – 2020-06-14 (×6): 73.125 mg via INTRAVENOUS
  Filled 2020-06-11 (×6): qty 2

## 2020-06-11 MED ORDER — SODIUM CHLORIDE 0.9 % IV SOLN: 250.0000 mL | INTRAVENOUS | Status: DC | PRN

## 2020-06-11 MED ORDER — INSULIN GLARGINE 100 UNIT/ML ~~LOC~~ SOLN
8.0000 [IU] | Freq: Every day | SUBCUTANEOUS | Status: DC
  Administered 2020-06-12 – 2020-06-13 (×3): 8 [IU] via SUBCUTANEOUS
  Filled 2020-06-11 (×4): qty 0.08

## 2020-06-11 MED ORDER — DARBEPOETIN ALFA 60 MCG/0.3ML IJ SOSY
60.0000 ug | PREFILLED_SYRINGE | INTRAMUSCULAR | Status: DC
  Filled 2020-06-11: qty 0.3

## 2020-06-11 MED ORDER — METOPROLOL TARTRATE 25 MG PO TABS
25.0000 mg | ORAL_TABLET | Freq: Two times a day (BID) | ORAL | Status: DC
  Administered 2020-06-13: 25 mg via ORAL
  Filled 2020-06-11 (×3): qty 1

## 2020-06-11 MED ORDER — HEPARIN (PORCINE) 25000 UT/250ML-% IV SOLN
1600.0000 [IU]/h | INTRAVENOUS | Status: DC
  Administered 2020-06-12: 1150 [IU]/h via INTRAVENOUS
  Administered 2020-06-12: 1300 [IU]/h via INTRAVENOUS
  Administered 2020-06-14 – 2020-06-15 (×3): 1600 [IU]/h via INTRAVENOUS
  Filled 2020-06-11 (×7): qty 250

## 2020-06-11 MED ORDER — INSULIN ASPART 100 UNIT/ML ~~LOC~~ SOLN
0.0000 [IU] | Freq: Three times a day (TID) | SUBCUTANEOUS | Status: DC
  Administered 2020-06-12: 4 [IU] via SUBCUTANEOUS
  Administered 2020-06-12: 7 [IU] via SUBCUTANEOUS
  Administered 2020-06-12: 11 [IU] via SUBCUTANEOUS
  Administered 2020-06-13: 3 [IU] via SUBCUTANEOUS
  Administered 2020-06-13 – 2020-06-14 (×5): 4 [IU] via SUBCUTANEOUS
  Administered 2020-06-15: 7 [IU] via SUBCUTANEOUS
  Administered 2020-06-15 (×2): 3 [IU] via SUBCUTANEOUS
  Administered 2020-06-16 (×2): 4 [IU] via SUBCUTANEOUS
  Administered 2020-06-16: 7 [IU] via SUBCUTANEOUS
  Administered 2020-06-17: 4 [IU] via SUBCUTANEOUS
  Administered 2020-06-17: 7 [IU] via SUBCUTANEOUS
  Administered 2020-06-18: 4 [IU] via SUBCUTANEOUS
  Administered 2020-06-18: 11 [IU] via SUBCUTANEOUS
  Administered 2020-06-18 – 2020-06-20 (×5): 7 [IU] via SUBCUTANEOUS
  Administered 2020-06-20: 3 [IU] via SUBCUTANEOUS

## 2020-06-11 MED ORDER — PREDNISONE 50 MG PO TABS
50.0000 mg | ORAL_TABLET | Freq: Every day | ORAL | Status: DC
  Administered 2020-06-14 – 2020-06-17 (×4): 50 mg via ORAL
  Filled 2020-06-11 (×4): qty 1

## 2020-06-11 MED ORDER — MYCOPHENOLATE SODIUM 180 MG PO TBEC: 360.0000 mg | DELAYED_RELEASE_TABLET | Freq: Two times a day (BID) | ORAL | Status: DC

## 2020-06-11 MED ORDER — PANTOPRAZOLE SODIUM 40 MG PO TBEC
80.0000 mg | DELAYED_RELEASE_TABLET | Freq: Every day | ORAL | Status: DC
  Administered 2020-06-12 – 2020-07-20 (×39): 80 mg via ORAL
  Filled 2020-06-11 (×39): qty 2

## 2020-06-11 MED ORDER — TRAMADOL HCL 50 MG PO TABS
50.0000 mg | ORAL_TABLET | Freq: Four times a day (QID) | ORAL | Status: DC | PRN
  Administered 2020-07-02 – 2020-07-12 (×6): 50 mg via ORAL
  Filled 2020-06-11 (×7): qty 1

## 2020-06-11 MED ORDER — TAMSULOSIN HCL 0.4 MG PO CAPS
0.4000 mg | ORAL_CAPSULE | Freq: Every day | ORAL | Status: DC
  Administered 2020-06-12 – 2020-07-20 (×39): 0.4 mg via ORAL
  Filled 2020-06-11 (×42): qty 1

## 2020-06-11 MED ORDER — SODIUM CHLORIDE 0.9 % IV SOLN
200.0000 mg | Freq: Once | INTRAVENOUS | Status: AC
  Administered 2020-06-12: 200 mg via INTRAVENOUS
  Filled 2020-06-11: qty 40
  Filled 2020-06-11: qty 200

## 2020-06-11 MED ORDER — SODIUM CHLORIDE 0.9 % IV SOLN
100.0000 mg | Freq: Every day | INTRAVENOUS | Status: AC
  Administered 2020-06-12 – 2020-06-15 (×4): 100 mg via INTRAVENOUS
  Filled 2020-06-11 (×3): qty 20
  Filled 2020-06-11: qty 100

## 2020-06-11 MED ORDER — HYDROCOD POLST-CPM POLST ER 10-8 MG/5ML PO SUER
5.0000 mL | Freq: Two times a day (BID) | ORAL | Status: DC | PRN
  Administered 2020-06-13 – 2020-06-14 (×2): 5 mL via ORAL
  Filled 2020-06-11 (×3): qty 5

## 2020-06-11 MED ORDER — SERTRALINE HCL 50 MG PO TABS
50.0000 mg | ORAL_TABLET | Freq: Every day | ORAL | Status: DC
  Administered 2020-06-12 – 2020-07-19 (×38): 50 mg via ORAL
  Filled 2020-06-11 (×39): qty 1

## 2020-06-11 MED ORDER — SODIUM CHLORIDE 0.9% FLUSH
3.0000 mL | INTRAVENOUS | Status: DC | PRN
  Administered 2020-07-03 – 2020-07-16 (×2): 3 mL via INTRAVENOUS

## 2020-06-11 MED ORDER — SODIUM CHLORIDE 0.9% FLUSH
3.0000 mL | Freq: Two times a day (BID) | INTRAVENOUS | Status: DC
  Administered 2020-06-12 – 2020-07-19 (×62): 3 mL via INTRAVENOUS

## 2020-06-11 MED ORDER — TACROLIMUS 1 MG PO CAPS
2.0000 mg | ORAL_CAPSULE | Freq: Two times a day (BID) | ORAL | Status: DC
  Administered 2020-06-12 – 2020-07-17 (×69): 2 mg via ORAL
  Filled 2020-06-11 (×72): qty 2

## 2020-06-11 MED ORDER — MAGNESIUM CHLORIDE 64 MG PO TBEC
2.0000 | DELAYED_RELEASE_TABLET | Freq: Two times a day (BID) | ORAL | Status: DC
  Administered 2020-06-12 – 2020-07-20 (×76): 128 mg via ORAL
  Filled 2020-06-11 (×79): qty 2

## 2020-06-11 MED ORDER — DILTIAZEM LOAD VIA INFUSION
10.0000 mg | Freq: Once | INTRAVENOUS | Status: AC
  Administered 2020-06-11: 10 mg via INTRAVENOUS
  Filled 2020-06-11: qty 10

## 2020-06-11 MED ORDER — METOPROLOL TARTRATE 5 MG/5ML IV SOLN
5.0000 mg | Freq: Four times a day (QID) | INTRAVENOUS | Status: DC
  Administered 2020-06-12 (×4): 5 mg via INTRAVENOUS
  Filled 2020-06-11 (×4): qty 5

## 2020-06-11 MED ORDER — ASPIRIN 81 MG PO CHEW
81.0000 mg | CHEWABLE_TABLET | Freq: Every day | ORAL | Status: DC
  Administered 2020-06-12 – 2020-07-20 (×38): 81 mg via ORAL
  Filled 2020-06-11 (×39): qty 1

## 2020-06-11 MED ORDER — ACETAMINOPHEN 325 MG PO TABS
650.0000 mg | ORAL_TABLET | Freq: Four times a day (QID) | ORAL | Status: DC | PRN
  Administered 2020-06-13 – 2020-07-06 (×4): 650 mg via ORAL
  Filled 2020-06-11 (×6): qty 2

## 2020-06-11 MED ORDER — POLYETHYLENE GLYCOL 3350 17 G PO PACK
17.0000 g | PACK | Freq: Every day | ORAL | Status: DC
  Administered 2020-06-12 – 2020-06-17 (×5): 17 g via ORAL
  Filled 2020-06-11 (×5): qty 1

## 2020-06-11 MED ORDER — BISACODYL 5 MG PO TBEC
10.0000 mg | DELAYED_RELEASE_TABLET | Freq: Every day | ORAL | Status: DC
Start: 2020-06-11 — End: 2020-06-20
  Administered 2020-06-12 – 2020-06-17 (×5): 10 mg via ORAL
  Filled 2020-06-11 (×8): qty 2

## 2020-06-11 MED ORDER — AMLODIPINE BESYLATE 10 MG PO TABS
10.0000 mg | ORAL_TABLET | Freq: Every day | ORAL | Status: DC
  Administered 2020-06-13 – 2020-06-14 (×2): 10 mg via ORAL
  Filled 2020-06-11 (×2): qty 1
  Filled 2020-06-11: qty 2

## 2020-06-11 MED ORDER — ACETAMINOPHEN 650 MG RE SUPP
650.0000 mg | Freq: Once | RECTAL | Status: AC
  Administered 2020-06-11: 650 mg via RECTAL
  Filled 2020-06-11: qty 1

## 2020-06-11 MED ORDER — DILTIAZEM HCL-DEXTROSE 125-5 MG/125ML-% IV SOLN (PREMIX)
5.0000 mg/h | INTRAVENOUS | Status: DC
  Administered 2020-06-11: 5 mg/h via INTRAVENOUS
  Filled 2020-06-11: qty 125

## 2020-06-11 MED ORDER — GUAIFENESIN-DM 100-10 MG/5ML PO SYRP
10.0000 mL | ORAL_SOLUTION | ORAL | Status: DC | PRN
  Filled 2020-06-11: qty 10

## 2020-06-11 MED ORDER — DOCUSATE SODIUM 100 MG PO CAPS
100.0000 mg | ORAL_CAPSULE | Freq: Two times a day (BID) | ORAL | Status: DC
  Administered 2020-06-12 – 2020-06-20 (×8): 100 mg via ORAL
  Filled 2020-06-11 (×13): qty 1

## 2020-06-11 MED ORDER — FUROSEMIDE 10 MG/ML IJ SOLN
40.0000 mg | Freq: Four times a day (QID) | INTRAMUSCULAR | Status: DC
  Administered 2020-06-11 – 2020-06-12 (×2): 40 mg via INTRAVENOUS
  Filled 2020-06-11 (×2): qty 4

## 2020-06-11 MED ORDER — APIXABAN 5 MG PO TABS: 5.0000 mg | ORAL_TABLET | Freq: Two times a day (BID) | ORAL | Status: DC

## 2020-06-11 MED ORDER — ATORVASTATIN CALCIUM 10 MG PO TABS
10.0000 mg | ORAL_TABLET | ORAL | Status: DC
  Administered 2020-06-12 – 2020-06-26 (×7): 10 mg via ORAL
  Filled 2020-06-11 (×8): qty 1

## 2020-06-11 NOTE — ED Notes (Signed)
Renal at bedside.

## 2020-06-11 NOTE — Progress Notes (Signed)
Pt renal with flistula on Rt side. Stuck x 3 with pt ok to do so

## 2020-06-11 NOTE — ED Provider Notes (Signed)
[facility] EMERGENCY DEPARTMENT Provider Note   CSN: [id number] Arrival date & time: 06/11/20  1214     History Chief Complaint  Patient presents with  . Weakness  . Shortness of Breath  . Tachycardia    Paul [name] is a 65 y.o. male.  Paul [name] is a 65 y.o. male with a hx of pAF (recurrence 12/2019 in setting of septic shock), PAD, ESRD with failed renal transplant, CVA who presents to the ED via EMS with a chief complaint of missed dialysis, COVID-positive. Patient reports being diagnosed with Covid 19 x a few days ago. Symptoms have been kept under control at home until he felt weaker today. Reports overall malaise along with shortness of breath. He is on dialysis [facility] but missed yesterday due to "bad weather". He   The history is provided by the patient.       Past Medical History:  Diagnosis Date  . A-fib ([place])   . Anemia    esrd  . Calciphylaxis 05/2013   lower ext  . Cancer ([place])    hx duodenal adenoCa 2002, resected  . Closed left arm fracture 1967  . Corneal abrasion, left   . Depression   . Diabetes mellitus    Type 1 iddm x 11 yrs  . Diabetic neuropathy ([place])   . ESRD on hemodialysis ([place])    Pt has ESRD due to DM.  He had a L upper arm AVF prior to starting HD which was ligated due to L arm steal syndrome.  He started HD in Jan 2014 and did home HD.  The family couldn't cannulate the R arm AVF successfully so by mid 2014 they decided to switch to PD which was done late summer 2014.  In Jan 2015 he was admitted with FTT felt to be due to underdialysis on PD and PD was abandoned and he   . GERD (gastroesophageal reflux disease)   . Glomerulosclerosis, diabetic ([place])   . Heart murmur   . History of blood transfusion   . Hypertension    off of meds due to orthostatic hypotension  . Kidney transplant recipient   . Open wound of both legs with complication    Pt has had progressive wounds of both LE's including gangrene of the toes and  patchy necrosis of the calves.  He has been treated at [facility] by Dr Jerline Pain with hyperbaric O2 5d per week.  He is getting Na Thiosulfate with HD for suspected calciphylaxis. Pt says doctor's aren't sure if these ulcers were diabetic ulcers or calciphylaxis.  He underwent bilat BKA on 07/30/13.  He says that the woun    Patient Active Problem List   Diagnosis Date Noted  . Labile blood glucose   . Diabetic peripheral neuropathy ([place])   . Anemia of chronic disease   . Right below-knee amputee ([place]) 01/18/2020  . Amputation stump infection ([place])   . Subacute osteomyelitis of right tibia ([place])   . Dehiscence of amputation stump ([place])   . Sepsis ([place]) 01/04/2020  . Cellulitis 12/31/2019  . CKD stage 4 due to type 2 diabetes mellitus ([place]) 04/29/2019  . AKI (acute kidney injury) ([place]) 04/24/2019  . Hyperglycemia 04/24/2019  . Nausea and vomiting 04/24/2019  . Fever 04/23/2019  . Acute metabolic encephalopathy 15/40/0867  . Stroke ([place]) 10/28/2018  . Middle cerebral artery embolism, left 10/28/2018  . Metabolic acidosis 61/95/0932  . Dehydration 05/24/2018  . Stage II pressure ulcer ([place]) 05/24/2018  .  Dyspnea 05/23/2018  . Acute respiratory failure with hypoxia ([place]) 05/23/2018  . Abnormal cardiovascular stress test 12/29/2013  . Awaiting organ transplant 12/09/2013  . Leg ulcer ([place]) 11/17/2013  . Chronic kidney disease (CKD), stage V ([place]) 11/17/2013  . Diabetes ([place]) 11/17/2013  . Cancer of duodenum ([place]) 11/17/2013  . Dyslipidemia 11/17/2013  . Chronic kidney disease requiring chronic dialysis ([place]) 11/17/2013  . Hypercholesteremia 11/17/2013  . H/O malignant neoplasm 11/17/2013  . H/O: HTN (hypertension) 11/17/2013  . BP (high blood pressure) 11/17/2013  . Hypotension 11/17/2013  . Angiopathy, peripheral ([place]) 11/17/2013  . AF (paroxysmal atrial fibrillation) ([place]) 11/17/2013  . BKA stump complication ([place]) 62/22/9798  . S/P bilateral BKA (below knee amputation) ([place])  08/03/2013  . S/P BKA (below knee amputation) bilateral ([place]) 07/30/2013  . Open wound of both legs with complication 92/03/9416  . Acute blood loss anemia 07/12/2013  . Syncope 07/09/2013  . Intolerance to CAPD peritoneal dialysis s/p CAPD cath removal 07/09/2013 07/05/2013  . Critical lower limb ischemia ([place]) 06/30/2013  . Extremity pain 06/30/2013  . Atherosclerosis of native arteries of the extremities with ulceration(440.23) 06/17/2013  . Gait disorder 05/31/2013  . Weakness 05/24/2013  . Depression 04/03/2013  . GERD (gastroesophageal reflux disease) 04/03/2013  . Atrial fibrillation ([place]) 04/02/2013  . DM (diabetes mellitus) ([place]) 12/28/2012  . Other complications due to renal dialysis device, implant, and graft 05/19/2012  . Diabetes mellitus, type 2 ([place]) 11/13/2011  . Anemia 07/02/2011  . ESRD on hemodialysis ([place]) 05/29/2011    Past Surgical History:  Procedure Laterality Date  . AMPUTATION Bilateral 07/30/2013   Procedure: AMPUTATION BELOW KNEE;  Surgeon: Newt Minion, MD;  Location: [facility];  Service: Orthopedics;  Laterality: Bilateral;  Bilateral Below Knee Amputations  . AMPUTATION Bilateral 09/03/2013   Procedure: AMPUTATION BELOW KNEE;  Surgeon: Newt Minion, MD;  Location: [facility];  Service: Orthopedics;  Laterality: Bilateral;  Bilateral Below Knee Amputation Revision  . AV FISTULA PLACEMENT  06/07/2011   Procedure: ARTERIOVENOUS (AV) FISTULA CREATION;  Surgeon: Angelia Mould, MD;  Location: [facility];  Service: Vascular;  Laterality: Left;  . AV FISTULA PLACEMENT  06/18/2012   Procedure: ARTERIOVENOUS (AV) FISTULA CREATION;  Surgeon: Mal Misty, MD;  Location: [facility];  Service: Vascular;  Laterality: Right;  [place]  . BILIARY DIVERSION, EXTERNAL  2002  . BONE MARROW BIOPSY  2012  . CAPD INSERTION N/A 01/14/2013   Procedure: LAPAROSCOPIC INSERTION CONTINUOUS AMBULATORY PERITONEAL DIALYSIS  (CAPD) CATHETER;  Surgeon: Adin Hector, MD;  Location: [facility];   Service: General;  Laterality: N/A;  . CAPD REMOVAL N/A 07/09/2013   Procedure: CONTINUOUS AMBULATORY PERITONEAL DIALYSIS  (CAPD) CATHETER REMOVAL;  Surgeon: Adin Hector, MD;  Location: [facility];  Service: General;  Laterality: N/A;  . COLONOSCOPY     Hx: of  . INSERTION OF DIALYSIS CATHETER  05/22/2012   Procedure: INSERTION OF DIALYSIS CATHETER;  Surgeon: Mal Misty, MD;  Location: [facility];  Service: Vascular;  Laterality: N/A;  right internal jugular vein  . IR ANGIO INTRA EXTRACRAN SEL COM CAROTID INNOMINATE BILAT MOD SED  10/28/2018  . IR ANGIO VERTEBRAL SEL SUBCLAVIAN INNOMINATE UNI R MOD SED  10/28/2018  . IR ANGIO VERTEBRAL SEL VERTEBRAL UNI L MOD SED  10/28/2018  . LIGATION OF ARTERIOVENOUS  FISTULA  05/22/2012   Procedure: LIGATION OF ARTERIOVENOUS  FISTULA;  Surgeon: Mal Misty, MD;  Location: [facility];  Service: Vascular;  Laterality: Left;  Left  brachio-cephalic fistula  . LOWER EXTREMITY ANGIOGRAM N/A 06/14/2013   Procedure: LOWER EXTREMITY ANGIOGRAM;  Surgeon: Angelia Mould, MD;  Location: [facility] CATH LAB;  Service: Cardiovascular;  Laterality: N/A;  . NEPHRECTOMY TRANSPLANTED ORGAN    . OTHER SURGICAL HISTORY     Cyst removed from back  . PORTACATH PLACEMENT  2002  . RADIOLOGY WITH ANESTHESIA N/A 10/28/2018   Procedure: IR WITH ANESTHESIA;  Surgeon: Radiologist, Medication, MD;  Location: [facility];  Service: Radiology;  Laterality: N/A;  . REMOVAL OF A DIALYSIS CATHETER    . RENAL BIOPSY, PERCUTANEOUS  2012  . STUMP REVISION Right 01/12/2020   Procedure: REVISION RIGHT BELOW KNEE AMPUTATION;  Surgeon: Newt Minion, MD;  Location: [facility];  Service: Orthopedics;  Laterality: Right;  . TONSILLECTOMY    . VASCULAR SURGERY    . WHIPPLE PROCEDURE  2002   duodenal ca, Dr. Harlow Asa       Family History  Problem Relation Age of Onset  . Cancer Mother   . Cancer Father        prostate cancer  . Heart disease Maternal Grandfather   . Stroke Paternal Grandmother     Social  History   Tobacco Use  . Smoking status: Never Smoker  . Smokeless tobacco: Never Used  Vaping Use  . Vaping Use: Never used  Substance Use Topics  . Alcohol use: No  . Drug use: No    Home Medications Prior to Admission medications   Medication Sig Start Date End Date Taking? Authorizing Provider  acetaminophen (TYLENOL) 325 MG tablet Take 2 tablets (650 mg total) by mouth every 6 (six) hours as needed for mild pain (or Fever >/= 101). 01/18/20   Georgette Shell, MD  amLODipine (NORVASC) 10 MG tablet Take 1 tablet (10 mg total) by mouth daily. 05/29/20   O'Neal, Cassie Freer, MD  apixaban (ELIQUIS) 5 MG TABS tablet Take 1 tablet (5 mg total) by mouth 2 (two) times daily. 01/31/20   Angiulli, Lavon Paganini, PA-C  aspirin 81 MG chewable tablet Chew 81 mg by mouth daily.    [provider]  atorvastatin (LIPITOR) 10 MG tablet Take 1 tablet (10 mg total) by mouth every Monday, Wednesday, and Friday. 01/31/20   Angiulli, Lavon Paganini, PA-C  bisacodyl (DULCOLAX) 5 MG EC tablet Take 2 tablets (10 mg total) by mouth daily. 01/19/20   Georgette Shell, MD  [employer]S D3 50 MCG (2000 UT) CAPS Take 2 capsules (4,000 Units total) by mouth daily. 01/31/20   Angiulli, Lavon Paganini, PA-C  Darbepoetin Alfa (ARANESP) 60 MCG/0.3ML SOSY injection Inject 0.3 mLs (60 mcg total) into the vein every Thursday with hemodialysis. 02/03/20   Angiulli, Lavon Paganini, PA-C  docusate sodium (COLACE) 100 MG capsule Take 1 capsule (100 mg total) by mouth 2 (two) times daily. 01/18/20   Georgette Shell, MD  Insulin Glargine [facility]) 100 UNIT/ML Inject 8 Units into the skin daily. 01/31/20   Angiulli, Lavon Paganini, PA-C  lipase/protease/amylase (CREON) 12000-38000 units CPEP capsule Take 1 capsule (12,000 Units total) by mouth 3 (three) times daily before meals. 01/31/20   Angiulli, Lavon Paganini, PA-C  magnesium chloride (SLOW-MAG) 64 MG TBEC SR tablet Take 2 tablets (128 mg total) by mouth 2 (two) times daily. Take at least 2 hours  separate from tacrolimus. 01/31/20   Angiulli, Lavon Paganini, PA-C  metoprolol tartrate (LOPRESSOR) 25 MG tablet Take 1 tablet (25 mg total) by mouth 2 (two) times daily. 05/29/20  Geralynn Rile, MD  mycophenolate (MYFORTIC) 180 MG EC tablet Take 2 tablets (360 mg total) by mouth 2 (two) times daily. 01/31/20   Angiulli, Lavon Paganini, PA-C  omeprazole (PRILOSEC) 40 MG capsule Take 1 capsule (40 mg total) by mouth at bedtime. 01/31/20   Angiulli, Lavon Paganini, PA-C  polyethylene glycol (MIRALAX / GLYCOLAX) 17 g packet Take 17 g by mouth daily. 01/19/20   Georgette Shell, MD  predniSONE (DELTASONE) 5 MG tablet Take 1 tablet (5 mg total) by mouth daily with breakfast. 01/31/20   Angiulli, Lavon Paganini, PA-C  sertraline (ZOLOFT) 50 MG tablet Take 1 tablet (50 mg total) by mouth at bedtime. 01/31/20   Angiulli, Lavon Paganini, PA-C  tacrolimus (PROGRAF) 1 MG capsule Take 2-3 mg by mouth See admin instructions. Take 3 mg by mouth in the morning at 9 AM and 2 mg in the evening at 9 PM    [provider]  tacrolimus (PROGRAF) 1 MG capsule Take 2 capsules (2 mg total) by mouth 2 (two) times daily. 01/31/20   Angiulli, Lavon Paganini, PA-C  tamsulosin (FLOMAX) 0.4 MG CAPS capsule Take 1 capsule (0.4 mg total) by mouth daily after supper. 01/31/20   Angiulli, Lavon Paganini, PA-C    Allergies    Metformin and related  Review of Systems   Review of Systems  Constitutional: Positive for fever.  HENT: Negative for sore throat.   Respiratory: Positive for shortness of breath.   Cardiovascular: Negative for chest pain.  Gastrointestinal: Negative for abdominal pain, blood in stool, diarrhea, nausea and vomiting.  Genitourinary: Negative for flank pain.  Musculoskeletal: Negative for back pain.  Skin: Negative for pallor and wound.  Neurological: Negative for light-headedness and headaches.  All other systems reviewed and are negative.   Physical Exam Updated Vital Signs BP (!) 192/125   Pulse (!) 129   Temp 99.2 F (37.3  C) (Oral)   Resp 17   SpO2 92%   Physical Exam Vitals and nursing note reviewed.  Constitutional:      General: He is not in acute distress.    Appearance: He is ill-appearing.  HENT:     Head: Normocephalic and atraumatic.  Eyes:     Pupils: Pupils are equal, round, and reactive to light.  Cardiovascular:     Rate and Rhythm: Tachycardia present.  Pulmonary:     Effort: Pulmonary effort is normal. No tachypnea or accessory muscle usage.     Breath sounds: Decreased breath sounds and rales present.  Chest:     Chest wall: No tenderness.  Abdominal:     General: A surgical scar is present.     Palpations: Abdomen is soft.     Tenderness: There is no abdominal tenderness. There is no right CVA tenderness or left CVA tenderness.     Comments: Abdomen is soft non tender to palpation. Surgical scar present.   Musculoskeletal:     Right Lower Extremity: Right leg is amputated below knee.     Left Lower Extremity: Left leg is amputated below knee.  Skin:    General: Skin is warm and dry.  Neurological:     Mental Status: He is alert and oriented to person, place, and time.     ED Results / Procedures / Treatments   Labs (all labs ordered are listed, but only abnormal results are displayed) Labs Reviewed  SARS CORONAVIRUS 2 BY RT PCR ([facility]) - Abnormal; Notable for the  following components:      Result Value   SARS Coronavirus 2 POSITIVE (*)    All other components within normal limits  CBC WITH DIFFERENTIAL/PLATELET - Abnormal; Notable for the following components:   Hemoglobin 12.4 (*)    MCHC 28.8 (*)    RDW 17.8 (*)    Platelets 101 (*)    All other components within normal limits  COMPREHENSIVE METABOLIC PANEL - Abnormal; Notable for the following components:   Glucose, Bld 232 (*)    BUN 36 (*)    Creatinine, Ser 5.78 (*)    Calcium 8.2 (*)    Total Protein 6.4 (*)    Albumin 2.6 (*)    GFR, Estimated 10 (*)     Anion gap 16 (*)    All other components within normal limits  LACTIC ACID, PLASMA - Abnormal; Notable for the following components:   Lactic Acid, Venous 2.2 (*)    All other components within normal limits  LACTATE DEHYDROGENASE - Abnormal; Notable for the following components:   LDH 255 (*)    All other components within normal limits  FERRITIN - Abnormal; Notable for the following components:   Ferritin 740 (*)    All other components within normal limits  C-REACTIVE PROTEIN - Abnormal; Notable for the following components:   CRP 15.3 (*)    All other components within normal limits  CULTURE, BLOOD (ROUTINE X 2)  CULTURE, BLOOD (ROUTINE X 2)  PROCALCITONIN  TRIGLYCERIDES  LACTIC ACID, PLASMA  D-DIMER, QUANTITATIVE (NOT AT [facility])  FIBRINOGEN  BRAIN NATRIURETIC PEPTIDE    EKG EKG Interpretation  Date/Time:  Sunday June 11 2020 12:37:42 EST Ventricular Rate:  134 PR Interval:    QRS Duration: 86 QT Interval:  328 QTC Calculation: 490 R Axis:   -49 Text Interpretation: Atrial fibrillation with rapid ventricular response Left anterior fascicular block Anteroseptal infarct, old Borderline repolarization abnormality Confirmed by Pattricia Boss [phone]) on 06/11/2020 1:22:42 PM Also confirmed by Pattricia Boss [phone]), editor Hattie Perch [phone])  on 06/11/2020 1:23:28 PM   Radiology DG Chest 1 View  Result Date: 06/11/2020 CLINICAL DATA:  Shortness of breath, COVID-19 positive EXAM: CHEST  1 VIEW COMPARISON:  01/04/2020 FINDINGS: Mild cardiomegaly. Atherosclerotic calcification of the aortic knob. Diffuse interstitial prominence with streaky interstitial opacities most pronounced within the mid to lower lung fields. Small left pleural effusion. No pneumothorax. IMPRESSION: Diffuse interstitial prominence with streaky interstitial opacities within the mid to lower lung fields and small left pleural effusion. Findings may reflect multifocal atypical/viral infection versus  pulmonary edema. Electronically Signed   By: Davina Poke D.O.   On: 06/11/2020 14:15    Procedures .Critical Care Performed by: Janeece Fitting, PA-C Authorized by: Janeece Fitting, PA-C   Critical care provider statement:    Critical care time (minutes):  45   Critical care start time:  06/11/2020 2:00 PM   Critical care end time:  06/11/2020 2:45 PM   Critical care time was exclusive of:  Separately billable procedures and treating other patients   Critical care was necessary to treat or prevent imminent or life-threatening deterioration of the following conditions:  Circulatory failure   Critical care was time spent personally by me on the following activities:  Blood draw for specimens, development of treatment plan with patient or surrogate, discussions with consultants, evaluation of patient's response to treatment, examination of patient, obtaining history from patient or surrogate, ordering and performing treatments and interventions, ordering and review of laboratory studies, ordering  and review of radiographic studies, pulse oximetry, re-evaluation of patient's condition and review of old charts   (including critical care time)  Medications Ordered in ED Medications  diltiazem (CARDIZEM) 1 mg/mL load via infusion 10 mg (10 mg Intravenous Bolus from Bag 06/11/20 1504)    And  diltiazem (CARDIZEM) 125 mg in dextrose 5% 125 mL (1 mg/mL) infusion (5 mg/hr Intravenous New Bag/Given 06/11/20 1503)    ED Course  I have reviewed the triage vital signs and the nursing notes.  Pertinent labs & imaging results that were available during my care of the patient were reviewed by me and considered in my medical decision making (see chart for details).  Clinical Course as of 06/11/20 1514  Sun Jun 11, 2020  1459 Lactic Acid, Venous(!!): 2.2 [JS]  1459 Creatinine(!): 5.78 Above baseline, missed dialysis.  [JS]  9470 Potassium: 3.9 [JS]    Clinical Course User Index [JS] Janeece Fitting, PA-C    MDM Rules/Calculators/A&P  Patient presents to the ED via EMS from home with a chief complaint of weakness, shortness of breath.  Recently diagnosed with COVID-19 infection, reports symptoms have been worsening at home.  Was unable to have dialysis yesterday he is currently on a Tuesday, Thursday, Saturday dialysis however missed this due to bad weather.  Does endorse a fever, unknown with a T-max.  Also reports shortness of breath exacerbated with movement. He is bed bound due to BL BKAs.   Extensive chart review of patient's records, revealed patient's recent visit to cardiology for tachycardia with dialysis, heart rate was around 61 while in the office.  Does have a prior history of PAF, last recurrence was in August 2021 in the setting of septic shock. Extensive chart review with an ECHO from 2020  The left ventricle has normal systolic function with an ejection  fraction of 60-65%  EKG on today's visit likely patient on Afib with a rate of 962, metabolic panel is pending prior to placing him on rate control. Evaluation of his labs by me revealed a CMP with a normal potassium at 3.9, creatine level is 5.78 elevated, he reports missing dialysis yesterday due to feeling weak. He is currently on day 2 of his covid 19 infection.  COVID-19 levels have been obtained.  He did arrive with an oxygen saturation of 89% with a good waveform, he was placed on 2 L and oxygen has now improved to 94%.  Ferritin levels elevated today, CRP also elevated.  SPECT all inflammatory markers from his COVID-19 infection.  Xray of his chest: Diffuse interstitial prominence with streaky interstitial opacities  within the mid to lower lung fields and small left pleural effusion.  Findings may reflect multifocal atypical/viral infection versus  pulmonary edema.   His heart rate is elevated, temperature is 99.2, I do not believe he is septic,or fluid overloaded. I do feel that he will need dialysis when in the hospital  but with a normal potassium this can be obtain during his admission. Findings likely consistent with exacerbation of his COVID-19 infection.  A Cardizem drip has been started for patient for rate control.   2:39 PM Spoke to Dr. Carolin Sicks of nephrology who was informed patient will need dialysis while in the hospital as he did NOT go to dialysis yesterday.   2:56 PM nephrology at the bedside evaluating patient at this time.  2:59 PM Spoke to Dr. Alma Friendly hospitalist service who will admit patient for further management.    Portions of this note were  generated with [employer]. Dictation errors may occur despite best attempts at proofreading.  Final Clinical Impression(s) / ED Diagnoses Final diagnoses:  Weakness  COVID-19 virus infection  Shortness of breath    Rx / DC Orders ED Discharge Orders    None       Janeece Fitting, PA-C 06/11/20 1514    Pattricia Boss, MD 06/21/20 [phone]

## 2020-06-11 NOTE — ED Notes (Addendum)
EDP Karle Starch aware of pt HR  And dilt drip at maximum.

## 2020-06-11 NOTE — ED Notes (Signed)
Attempted to call report 1.

## 2020-06-11 NOTE — Consult Note (Signed)
[name] Renal Consultation Note    Indication for Consultation:  Management of ESRD/hemodialysis, anemia, hypertension/volume, and secondary hyperparathyroidism. PCP:  HPI: Paul [name] is a 65 y.o. 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  Pt was seen in ED - oriented, but slow in responses and unable to give full Hx. Per notes, was Dx with COVID several days ago. He missed HD yesterday. Presented to ED via EMS today with weakness and dyspnea. Found to be hypoxic requiring O2 supplementation. Vitals also + for tachycardia - EKG showing A-fib RVR, for which he was given IV diltiazem. Labs drawn: Na 140, K 3.9, Ca 8.2, WBC 7.6, Hgb 12.4, CRP 15.3, LA 2.2, Pro-calcitonin 0.44. COVID +. CXR with bilaterally streaky IS consolidations c/w COVID pneumonia.   He denies dyspnea at the moment (is on O2). Denies CP or abd pain. Denies N/V or diarrhea. Is afebrile. Thinks his son was + for COVID as well.  Reviewing Epic - has recently been seen for tachycardia with plan for ZIO holter monitor. TSH and Hgb normal range.  Dialyzes at [facility] on TTS schedule. Per OP records, he was last dialyzed on Thur 1/20 - he missed yesterday's treatment. He is typically compliant with HD. Uses R AVF as his access.  Past Medical History:  Diagnosis Date  . A-fib ([place])   . Anemia    esrd  . Calciphylaxis 05/2013   lower ext  . Cancer ([place])    hx duodenal adenoCa 2002, resected  . Closed left arm fracture 1967  . Corneal abrasion, left   . Depression   . Diabetes mellitus    Type 1 iddm x 11 yrs  . Diabetic neuropathy ([place])   . ESRD on hemodialysis ([place])    Pt has ESRD due to DM.  He had a L upper arm AVF prior to starting HD which was ligated due to L arm steal syndrome.  He started HD in Jan 2014 and did  home HD.  The family couldn't cannulate the R arm AVF successfully so by mid 2014 they decided to switch to PD which was done late summer 2014.  In Jan 2015 he was admitted with FTT felt to be due to underdialysis on PD and PD was abandoned and he   . GERD (gastroesophageal reflux disease)   . Glomerulosclerosis, diabetic ([place])   . Heart murmur   . History of blood transfusion   . Hypertension    off of meds due to orthostatic hypotension  . Kidney transplant recipient   . Open wound of both legs with complication    Pt has had progressive wounds of both LE's including gangrene of the toes and patchy necrosis of the calves.  He has been treated at [facility] by Dr Jerline Pain with hyperbaric O2 5d per week.  He is getting Na Thiosulfate with HD for suspected calciphylaxis. Pt says doctor's aren't sure if these ulcers were diabetic ulcers or calciphylaxis.  He underwent bilat BKA on 07/30/13.  He says that the woun   Past Surgical History:  Procedure Laterality Date  . AMPUTATION Bilateral 07/30/2013   Procedure: AMPUTATION BELOW KNEE;  Surgeon: Newt Minion, MD;  Location: [place];  Service: Orthopedics;  Laterality: Bilateral;  Bilateral Below Knee Amputations  . AMPUTATION Bilateral 09/03/2013   Procedure: AMPUTATION BELOW  KNEE;  Surgeon: Newt Minion, MD;  Location: [place];  Service: Orthopedics;  Laterality: Bilateral;  Bilateral Below Knee Amputation Revision  . AV FISTULA PLACEMENT  06/07/2011   Procedure: ARTERIOVENOUS (AV) FISTULA CREATION;  Surgeon: Angelia Mould, MD;  Location: [place];  Service: Vascular;  Laterality: Left;  . AV FISTULA PLACEMENT  06/18/2012   Procedure: ARTERIOVENOUS (AV) FISTULA CREATION;  Surgeon: Mal Misty, MD;  Location: [place];  Service: Vascular;  Laterality: Right;  [place]  . BILIARY DIVERSION, EXTERNAL  2002  . BONE MARROW BIOPSY  2012  . CAPD INSERTION N/A 01/14/2013   Procedure: LAPAROSCOPIC INSERTION CONTINUOUS AMBULATORY PERITONEAL DIALYSIS  (CAPD)  CATHETER;  Surgeon: Adin Hector, MD;  Location: [place];  Service: General;  Laterality: N/A;  . CAPD REMOVAL N/A 07/09/2013   Procedure: CONTINUOUS AMBULATORY PERITONEAL DIALYSIS  (CAPD) CATHETER REMOVAL;  Surgeon: Adin Hector, MD;  Location: [place];  Service: General;  Laterality: N/A;  . COLONOSCOPY     Hx: of  . INSERTION OF DIALYSIS CATHETER  05/22/2012   Procedure: INSERTION OF DIALYSIS CATHETER;  Surgeon: Mal Misty, MD;  Location: [place];  Service: Vascular;  Laterality: N/A;  right internal jugular vein  . IR ANGIO INTRA EXTRACRAN SEL COM CAROTID INNOMINATE BILAT MOD SED  10/28/2018  . IR ANGIO VERTEBRAL SEL SUBCLAVIAN INNOMINATE UNI R MOD SED  10/28/2018  . IR ANGIO VERTEBRAL SEL VERTEBRAL UNI L MOD SED  10/28/2018  . LIGATION OF ARTERIOVENOUS  FISTULA  05/22/2012   Procedure: LIGATION OF ARTERIOVENOUS  FISTULA;  Surgeon: Mal Misty, MD;  Location: [place];  Service: Vascular;  Laterality: Left;  Left brachio-cephalic fistula  . LOWER EXTREMITY ANGIOGRAM N/A 06/14/2013   Procedure: LOWER EXTREMITY ANGIOGRAM;  Surgeon: Angelia Mould, MD;  Location: [facility] CATH LAB;  Service: Cardiovascular;  Laterality: N/A;  . NEPHRECTOMY TRANSPLANTED ORGAN    . OTHER SURGICAL HISTORY     Cyst removed from back  . PORTACATH PLACEMENT  2002  . RADIOLOGY WITH ANESTHESIA N/A 10/28/2018   Procedure: IR WITH ANESTHESIA;  Surgeon: Radiologist, Medication, MD;  Location: [place];  Service: Radiology;  Laterality: N/A;  . REMOVAL OF A DIALYSIS CATHETER    . RENAL BIOPSY, PERCUTANEOUS  2012  . STUMP REVISION Right 01/12/2020   Procedure: REVISION RIGHT BELOW KNEE AMPUTATION;  Surgeon: Newt Minion, MD;  Location: [place];  Service: Orthopedics;  Laterality: Right;  . TONSILLECTOMY    . VASCULAR SURGERY    . WHIPPLE PROCEDURE  2002   duodenal ca, Dr. Harlow Asa   Family History  Problem Relation Age of Onset  . Cancer Mother   . Cancer Father        prostate cancer  . Heart disease Maternal Grandfather    . Stroke Paternal Grandmother    Social History:  reports that he has never smoked. He has never used smokeless tobacco. He reports that he does not drink alcohol and does not use drugs.  ROS: As per HPI otherwise negative.  Physical Exam: Vitals:   06/11/20 1324 06/11/20 1345 06/11/20 1415 06/11/20 1445  BP: (!) 177/109 (!) 159/111 (!) 170/126 (!) 192/125  Pulse: (!) 134 (!) 125 (!) 127 (!) 129  Resp: (!) _0 Temp:      TempSrc:      SpO2: 96% 94% 93% 92%     General: Ill appearing man, NAD on nasal O2.  Skin: Scattered flesh/pink colored maculopapular  rash across chest and BUE Head: Normocephalic, atraumatic, sclera non-icteric, mucus membranes are moist. Neck: Supple without lymphadenopathy/masses.  Lungs: Clear bilaterally to auscultation without wheezes, rales, or rhonchi.  Heart: RRR with normal S1, S2. No murmurs, rubs, or gallops appreciated. Abdomen: Soft, non-tender, non-distended with normoactive bowel sounds. Musculoskeletal:  Strength and tone appear normal for age. Lower extremities: B BKA without stump edema Neuro: Oriented X 3, but slow in responses and having issues with memory recall. Moves all extremities spontaneously. Dialysis Access: R forearm AVF + bruit  Allergies  Allergen Reactions  . Metformin And Related Other (See Comments)    Has kidney problems   Prior to Admission medications   Medication Sig Start Date End Date Taking? Authorizing Provider  acetaminophen (TYLENOL) 325 MG tablet Take 2 tablets (650 mg total) by mouth every 6 (six) hours as needed for mild pain (or Fever >/= 101). 01/18/20   Alwyn Ren, MD  amLODipine (NORVASC) 10 MG tablet Take 1 tablet (10 mg total) by mouth daily. 05/29/20   O'Neal, Ronnald Ramp, MD  apixaban (ELIQUIS) 5 MG TABS tablet Take 1 tablet (5 mg total) by mouth 2 (two) times daily. 01/31/20   Angiulli, Mcarthur Rossetti, PA-C  aspirin 81 MG chewable tablet Chew 81 mg by mouth daily.    [provider]  atorvastatin (LIPITOR) 10 MG tablet Take 1 tablet (10 mg total) by mouth every Monday, Wednesday, and Friday. 01/31/20   Angiulli, Mcarthur Rossetti, PA-C  bisacodyl (DULCOLAX) 5 MG EC tablet Take 2 tablets (10 mg total) by mouth daily. 01/19/20   Alwyn Ren, MD  CVS D3 50 MCG (2000 UT) CAPS Take 2 capsules (4,000 Units total) by mouth daily. 01/31/20   Angiulli, Mcarthur Rossetti, PA-C  Darbepoetin Alfa (ARANESP) 60 MCG/0.3ML SOSY injection Inject 0.3 mLs (60 mcg total) into the vein every Thursday with hemodialysis. 02/03/20   Angiulli, Mcarthur Rossetti, PA-C  docusate sodium (COLACE) 100 MG capsule Take 1 capsule (100 mg total) by mouth 2 (two) times daily. 01/18/20   Alwyn Ren, MD  Insulin Glargine [facility]) 100 UNIT/ML Inject 8 Units into the skin daily. 01/31/20   Angiulli, Mcarthur Rossetti, PA-C  lipase/protease/amylase (CREON) 12000-38000 units CPEP capsule Take 1 capsule (12,000 Units total) by mouth 3 (three) times daily before meals. 01/31/20   Angiulli, Mcarthur Rossetti, PA-C  magnesium chloride (SLOW-MAG) 64 MG TBEC SR tablet Take 2 tablets (128 mg total) by mouth 2 (two) times daily. Take at least 2 hours separate from tacrolimus. 01/31/20   Angiulli, Mcarthur Rossetti, PA-C  metoprolol tartrate (LOPRESSOR) 25 MG tablet Take 1 tablet (25 mg total) by mouth 2 (two) times daily. 05/29/20   Sande Rives, MD  mycophenolate (MYFORTIC) 180 MG EC tablet Take 2 tablets (360 mg total) by mouth 2 (two) times daily. 01/31/20   Angiulli, Mcarthur Rossetti, PA-C  omeprazole (PRILOSEC) 40 MG capsule Take 1 capsule (40 mg total) by mouth at bedtime. 01/31/20   Angiulli, Mcarthur Rossetti, PA-C  polyethylene glycol (MIRALAX / GLYCOLAX) 17 g packet Take 17 g by mouth daily. 01/19/20   Alwyn Ren, MD  predniSONE (DELTASONE) 5 MG tablet Take 1 tablet (5 mg total) by mouth daily with breakfast. 01/31/20   Angiulli, Mcarthur Rossetti, PA-C  sertraline (ZOLOFT) 50 MG tablet Take 1 tablet (50 mg total) by mouth at bedtime. 01/31/20   Angiulli, Mcarthur Rossetti, PA-C  tacrolimus (PROGRAF) 1 MG capsule Take 2-3 mg by mouth See admin instructions. Take  3 mg by mouth in the morning at 9 AM and 2 mg in the evening at 9 PM    [provider]  tacrolimus (PROGRAF) 1 MG capsule Take 2 capsules (2 mg total) by mouth 2 (two) times daily. 01/31/20   Angiulli, Mcarthur Rossetti, PA-C  tamsulosin (FLOMAX) 0.4 MG CAPS capsule Take 1 capsule (0.4 mg total) by mouth daily after supper. 01/31/20   Angiulli, Mcarthur Rossetti, PA-C   Current Facility-Administered Medications  Medication Dose Route Frequency Provider Last Rate Last Admin  . diltiazem (CARDIZEM) 1 mg/mL load via infusion 10 mg  10 mg Intravenous Once Soto, Johana, PA-C       And  . diltiazem (CARDIZEM) 125 mg in dextrose 5% 125 mL (1 mg/mL) infusion  5-15 mg/hr Intravenous Continuous Claude Manges, PA-C       Current Outpatient Medications  Medication Sig Dispense Refill  . acetaminophen (TYLENOL) 325 MG tablet Take 2 tablets (650 mg total) by mouth every 6 (six) hours as needed for mild pain (or Fever >/= 101).    [name] amLODipine (NORVASC) 10 MG tablet Take 1 tablet (10 mg total) by mouth daily. 90 tablet 3  . apixaban (ELIQUIS) 5 MG TABS tablet Take 1 tablet (5 mg total) by mouth 2 (two) times daily. 60 tablet 4  . aspirin 81 MG chewable tablet Chew 81 mg by mouth daily.    [name] atorvastatin (LIPITOR) 10 MG tablet Take 1 tablet (10 mg total) by mouth every Monday, Wednesday, and Friday. 30 tablet 0  . bisacodyl (DULCOLAX) 5 MG EC tablet Take 2 tablets (10 mg total) by mouth daily. 30 tablet 0  . CVS D3 50 MCG (2000 UT) CAPS Take 2 capsules (4,000 Units total) by mouth daily. 30 capsule 0  . Darbepoetin Alfa (ARANESP) 60 MCG/0.3ML SOSY injection Inject 0.3 mLs (60 mcg total) into the vein every Thursday with hemodialysis. 4.2 mL   . docusate sodium (COLACE) 100 MG capsule Take 1 capsule (100 mg total) by mouth 2 (two) times daily. 10 capsule 0  . Insulin Glargine (BASAGLAR KWIKPEN) 100 UNIT/ML Inject 8 Units into the  skin daily. 3 mL 0  . lipase/protease/amylase (CREON) 12000-38000 units CPEP capsule Take 1 capsule (12,000 Units total) by mouth 3 (three) times daily before meals. 270 capsule 0  . magnesium chloride (SLOW-MAG) 64 MG TBEC SR tablet Take 2 tablets (128 mg total) by mouth 2 (two) times daily. Take at least 2 hours separate from tacrolimus. 60 tablet 0  . metoprolol tartrate (LOPRESSOR) 25 MG tablet Take 1 tablet (25 mg total) by mouth 2 (two) times daily. 180 tablet 3  . mycophenolate (MYFORTIC) 180 MG EC tablet Take 2 tablets (360 mg total) by mouth 2 (two) times daily. 60 tablet 0  . omeprazole (PRILOSEC) 40 MG capsule Take 1 capsule (40 mg total) by mouth at bedtime. 30 capsule 0  . polyethylene glycol (MIRALAX / GLYCOLAX) 17 g packet Take 17 g by mouth daily. 14 each 0  . predniSONE (DELTASONE) 5 MG tablet Take 1 tablet (5 mg total) by mouth daily with breakfast. 30 tablet 0  . sertraline (ZOLOFT) 50 MG tablet Take 1 tablet (50 mg total) by mouth at bedtime. 30 tablet 0  . tacrolimus (PROGRAF) 1 MG capsule Take 2-3 mg by mouth See admin instructions. Take 3 mg by mouth in the morning at 9 AM and 2 mg in the evening at 9 PM    . tacrolimus (PROGRAF) 1 MG capsule Take 2  capsules (2 mg total) by mouth 2 (two) times daily. 60 capsule 0  . tamsulosin (FLOMAX) 0.4 MG CAPS capsule Take 1 capsule (0.4 mg total) by mouth daily after supper. 30 capsule 0   Labs: Basic Metabolic Panel: Recent Labs  Lab 06/11/20 1326  NA 140  K 3.9  CL 101  CO2 23  GLUCOSE 232*  BUN 36*  CREATININE 5.78*  CALCIUM 8.2*   Liver Function Tests: Recent Labs  Lab 06/11/20 1326  AST 23  ALT 11  ALKPHOS 80  BILITOT 1.1  PROT 6.4*  ALBUMIN 2.6*   CBC: Recent Labs  Lab 06/11/20 1326  WBC 7.6  NEUTROABS 5.7  HGB 12.4*  HCT 43.1  MCV 91.5  PLT 101*   Iron Studies:  Recent Labs    06/11/20 1326  FERRITIN 740*   Studies/Results: DG Chest 1 View  Result Date: 06/11/2020 CLINICAL DATA:  Shortness  of breath, COVID-19 positive EXAM: CHEST  1 VIEW COMPARISON:  01/04/2020 FINDINGS: Mild cardiomegaly. Atherosclerotic calcification of the aortic knob. Diffuse interstitial prominence with streaky interstitial opacities most pronounced within the mid to lower lung fields. Small left pleural effusion. No pneumothorax. IMPRESSION: Diffuse interstitial prominence with streaky interstitial opacities within the mid to lower lung fields and small left pleural effusion. Findings may reflect multifocal atypical/viral infection versus pulmonary edema. Electronically Signed   By: Davina Poke D.O.   On: 06/11/2020 14:15   Dialysis Orders: TTS at [facility] - last HD there was 1/20, left ~1kg under EDW 3:30hr, 400/800, EDW 66kg, 3K/2.25Ca, AVF, no heparin  - Hectoral 7mg Iv q HD - Mircera 2070m IV q 2 weeks (last given 1/13 - Hgb 10.9 at the time)  Assessment/Plan: 1.  COVID-19 pneumonia: With O2 requirement. Per admitting team. 2.  Tachycardia/A-fib RVR: S/p Diltiazem. Per primary team. 3.  ESRD: Usual TTS schedule - missed his last HD. Will plan for HD tomorrow, anticipate shortened HD and possible late schedule given high HD census/critical staffing shortages. 4.  Hypertension/volume: BP very high - resume home meds, will see how does after HD with volume offloading. Last OP HD he got under prior EDW, suspect needs lowering. 5.  Anemia: Hgb > 12, no ESA for now. 6.  Metabolic bone disease: CorrCa ok, Phos pending. Resume home binders (Renvela). 7.  T1DM: Insulin per primary. 8.   KaVeneta PentonPA-C 06/11/2020, 2:56 PM  [facility]

## 2020-06-11 NOTE — Progress Notes (Signed)
ANTICOAGULATION CONSULT NOTE - Initial Consult  Pharmacy Consult for heparin Indication: atrial fibrillation  Allergies  Allergen Reactions  . Metformin And Related Other (See Comments)    Has kidney problems    Patient Measurements: Height: 6' (182.9 cm) Weight: 72.9 kg (160 lb 11.5 oz) IBW/kg (Calculated) : 77.6 Heparin Dosing Weight: 72.9 kg  Vital Signs: Temp: 99 F (37.2 C) (01/23 2245) Temp Source: Oral (01/23 2245) BP: 135/89 (01/23 2245) Pulse Rate: 116 (01/23 2245)  Labs: Recent Labs    06/11/20 1326  HGB 12.4*  HCT 43.1  PLT 101*  CREATININE 5.78*    Estimated Creatinine Clearance: 13 mL/min (A) (by C-G formula based on SCr of 5.78 mg/dL (H)).   Medical History: Past Medical History:  Diagnosis Date  . A-fib ([place])   . Anemia    esrd  . Calciphylaxis 05/2013   lower ext  . Cancer ([place])    hx duodenal adenoCa 2002, resected  . Closed left arm fracture 1967  . Corneal abrasion, left   . Depression   . Diabetes mellitus    Type 1 iddm x 11 yrs  . Diabetic neuropathy ([place])   . ESRD on hemodialysis ([place])    Pt has ESRD due to DM.  He had a L upper arm AVF prior to starting HD which was ligated due to L arm steal syndrome.  He started HD in Jan 2014 and did home HD.  The family couldn't cannulate the R arm AVF successfully so by mid 2014 they decided to switch to PD which was done late summer 2014.  In Jan 2015 he was admitted with FTT felt to be due to underdialysis on PD and PD was abandoned and he   . GERD (gastroesophageal reflux disease)   . Glomerulosclerosis, diabetic ([place])   . Heart murmur   . History of blood transfusion   . Hypertension    off of meds due to orthostatic hypotension  . Kidney transplant recipient   . Open wound of both legs with complication    Pt has had progressive wounds of both LE's including gangrene of the toes and patchy necrosis of the calves.  He has been treated at [facility] by Dr Jerline Pain with hyperbaric O2 5d per  week.  He is getting Na Thiosulfate with HD for suspected calciphylaxis. Pt says doctor's aren't sure if these ulcers were diabetic ulcers or calciphylaxis.  He underwent bilat BKA on 07/30/13.  He says that the woun    Medications:  Medications Prior to Admission  Medication Sig Dispense Refill Last Dose  . apixaban (ELIQUIS) 5 MG TABS tablet Take 1 tablet (5 mg total) by mouth 2 (two) times daily. 60 tablet 4 06/06/2020 at 8am  . aspirin 81 MG chewable tablet Chew 81 mg by mouth daily.   06/06/2020  . atorvastatin (LIPITOR) 10 MG tablet Take 1 tablet (10 mg total) by mouth every Monday, Wednesday, and Friday. 30 tablet 0 06/05/2020  . CVS D3 50 MCG (2000 UT) CAPS Take 2 capsules (4,000 Units total) by mouth daily. 30 capsule 0 06/06/2020  . Insulin Glargine (BASAGLAR KWIKPEN) 100 UNIT/ML Inject 8 Units into the skin daily. 3 mL 0 1/18?  [name] insulin lispro (HUMALOG KWIKPEN) 100 UNIT/ML KwikPen Inject 2-3 Units into the skin 3 (three) times daily before meals.   unknown  . lipase/protease/amylase (CREON) 12000-38000 units CPEP capsule Take 1 capsule (12,000 Units total) by mouth 3 (three) times daily before meals. 270 capsule 0 06/06/2020  at am  . magnesium chloride (SLOW-MAG) 64 MG TBEC SR tablet Take 2 tablets (128 mg total) by mouth 2 (two) times daily. Take at least 2 hours separate from tacrolimus. 60 tablet 0 06/06/2020  . metoprolol tartrate (LOPRESSOR) 25 MG tablet Take 1 tablet (25 mg total) by mouth 2 (two) times daily. (Patient taking differently: Take 25 mg by mouth daily.) 180 tablet 3 06/06/2020 at 8am  . omeprazole (PRILOSEC) 40 MG capsule Take 1 capsule (40 mg total) by mouth at bedtime. 30 capsule 0 week ago  . predniSONE (DELTASONE) 5 MG tablet Take 1 tablet (5 mg total) by mouth daily with breakfast. 30 tablet 0 06/06/2020 at am  . sertraline (ZOLOFT) 50 MG tablet Take 1 tablet (50 mg total) by mouth at bedtime. (Patient taking differently: Take 50 mg by mouth daily.) 30 tablet 0 06/06/2020   . sodium bicarbonate 650 MG tablet Take 1,950 mg by mouth 4 (four) times daily.   06/06/2020 at am  . tacrolimus (PROGRAF) 1 MG capsule Take 2 capsules (2 mg total) by mouth 2 (two) times daily. 60 capsule 0 06/06/2020 at am  . tamsulosin (FLOMAX) 0.4 MG CAPS capsule Take 1 capsule (0.4 mg total) by mouth daily after supper. 30 capsule 0 week ago  . acetaminophen (TYLENOL) 325 MG tablet Take 2 tablets (650 mg total) by mouth every 6 (six) hours as needed for mild pain (or Fever >/= 101). (Patient not taking: No sig reported)   Not Taking at Unknown time  . amLODipine (NORVASC) 10 MG tablet Take 1 tablet (10 mg total) by mouth daily. (Patient not taking: Reported on 06/11/2020) 90 tablet 3 Not Taking at Unknown time  . bisacodyl (DULCOLAX) 5 MG EC tablet Take 2 tablets (10 mg total) by mouth daily. (Patient not taking: No sig reported) 30 tablet 0 Not Taking at Unknown time  . Darbepoetin Alfa (ARANESP) 60 MCG/0.3ML SOSY injection Inject 0.3 mLs (60 mcg total) into the vein every Thursday with hemodialysis. 4.2 mL  unknown  . docusate sodium (COLACE) 100 MG capsule Take 1 capsule (100 mg total) by mouth 2 (two) times daily. (Patient not taking: No sig reported) 10 capsule 0 Not Taking at Unknown time  . mycophenolate (MYFORTIC) 180 MG EC tablet Take 2 tablets (360 mg total) by mouth 2 (two) times daily. (Patient not taking: Reported on 06/11/2020) 60 tablet 0 Not Taking at Unknown time  . polyethylene glycol (MIRALAX / GLYCOLAX) 17 g packet Take 17 g by mouth daily. (Patient not taking: Reported on 06/11/2020) 14 each 0 Not Taking at Unknown time    Assessment: 65 yo man to transition from eilquis to heparin for afib while NPO.  Hg 12.4, PTLC 101 Goal of Therapy:  Heparin level 0.3-0.7 units/ml aPTT 66-102 seconds Monitor platelets by anticoagulation protocol: Yes   Plan:  Start heparin at 1150 unit/hr Check aPTT and heparin level 6-8 hrs after start then daily Daily CBC Monitor for bleeding  complications  Excell Seltzer Poteet 06/11/2020,11:15 PM

## 2020-06-11 NOTE — Significant Event (Signed)
Since patient is not alert awake enough and concern for aspiration we will keep patient n.p.o. and get speech evaluation.  We will change Eliquis to heparin infusion for now.  CBG checks.  Check ABG CT Head.  Paul [name]

## 2020-06-11 NOTE — H&P (Signed)
History and Physical    [name]:[id number] DOB: 1955/09/25 DOA: 06/11/2020  PCP: [name], MD   Patient coming from: home  I have personally briefly reviewed patient's old medical records in [place]  Chief Complaint: increased SOB, weakness, myalgias  HPI: Paul [name] is a 65 y.o. male with medical history significant of of pAF (recurrence 12/2019 in setting of septic shock), PAD, ESRD with failed renal transplant on HD TuThSat,  DM, diastolicCVA who presents to the ED via EMS with a chief complaint of missed dialysis, COVID-positive. Patient reports being diagnosed with Covid 19 x a few days ago. Symptoms have been kept under control at home until he felt weaker today. Reports overall malaise along with shortness of breath. He is on dialysis [facility] but missed yesterday due to "bad weather".    ED Course: T 99.2  148/100 HR 139 A Fib, RR 17. Lab: Glucoe 232, Cr 5.78, BNP >4,5000, LDH 255, Tgy 85, Ferritin 740, CRP 15.3, Lactic acid 2.2  Procalcitonin 0.44, D-dimer 2.23, Fibrinogen 399, WBC 7.6 w/ nl diff. CXR with multi-focal opacities, small left pleural effusion c/w atypical infection vs pulmonary edema. Covid 19 positive today. TR[facility] called to admit for covid pneumonia, PAF w/ RVR, possible acute HFpEF.  Review of Systems: As per HPI otherwise 10 point review of systems negative.    Past Medical History:  Diagnosis Date  . A-fib ([place])   . Anemia    esrd  . Calciphylaxis 05/2013   lower ext  . Cancer ([place])    hx duodenal adenoCa 2002, resected  . Closed left arm fracture 1967  . Corneal abrasion, left   . Depression   . Diabetes mellitus    Type 1 iddm x 11 yrs  . Diabetic neuropathy ([place])   . ESRD on hemodialysis ([place])    Pt has ESRD due to DM.  He had a L upper arm AVF prior to starting HD which was ligated due to L arm steal syndrome.  He started HD in Jan 2014 and did home HD.  The family couldn't cannulate the R arm AVF successfully so by mid 2014 they  decided to switch to PD which was done late summer 2014.  In Jan 2015 he was admitted with FTT felt to be due to underdialysis on PD and PD was abandoned and he   . GERD (gastroesophageal reflux disease)   . Glomerulosclerosis, diabetic ([place])   . Heart murmur   . History of blood transfusion   . Hypertension    off of meds due to orthostatic hypotension  . Kidney transplant recipient   . Open wound of both legs with complication    Pt has had progressive wounds of both LE's including gangrene of the toes and patchy necrosis of the calves.  He has been treated at [facility] by Dr Jerline Pain with hyperbaric O2 5d per week.  He is getting Na Thiosulfate with HD for suspected calciphylaxis. Pt says doctor's aren't sure if these ulcers were diabetic ulcers or calciphylaxis.  He underwent bilat BKA on 07/30/13.  He says that the woun    Past Surgical History:  Procedure Laterality Date  . AMPUTATION Bilateral 07/30/2013   Procedure: AMPUTATION BELOW KNEE;  Surgeon: Newt Minion, MD;  Location: [place];  Service: Orthopedics;  Laterality: Bilateral;  Bilateral Below Knee Amputations  . AMPUTATION Bilateral 09/03/2013   Procedure: AMPUTATION BELOW KNEE;  Surgeon: Newt Minion, MD;  Location: [place];  Service: Orthopedics;  Laterality: Bilateral;  Bilateral Below Knee Amputation Revision  . AV FISTULA PLACEMENT  06/07/2011   Procedure: ARTERIOVENOUS (AV) FISTULA CREATION;  Surgeon: Angelia Mould, MD;  Location: [place];  Service: Vascular;  Laterality: Left;  . AV FISTULA PLACEMENT  06/18/2012   Procedure: ARTERIOVENOUS (AV) FISTULA CREATION;  Surgeon: Mal Misty, MD;  Location: [place];  Service: Vascular;  Laterality: Right;  [place]  . BILIARY DIVERSION, EXTERNAL  2002  . BONE MARROW BIOPSY  2012  . CAPD INSERTION N/A 01/14/2013   Procedure: LAPAROSCOPIC INSERTION CONTINUOUS AMBULATORY PERITONEAL DIALYSIS  (CAPD) CATHETER;  Surgeon: Adin Hector, MD;  Location: [place];  Service: General;   Laterality: N/A;  . CAPD REMOVAL N/A 07/09/2013   Procedure: CONTINUOUS AMBULATORY PERITONEAL DIALYSIS  (CAPD) CATHETER REMOVAL;  Surgeon: Adin Hector, MD;  Location: [place];  Service: General;  Laterality: N/A;  . COLONOSCOPY     Hx: of  . INSERTION OF DIALYSIS CATHETER  05/22/2012   Procedure: INSERTION OF DIALYSIS CATHETER;  Surgeon: Mal Misty, MD;  Location: [place];  Service: Vascular;  Laterality: N/A;  right internal jugular vein  . IR ANGIO INTRA EXTRACRAN SEL COM CAROTID INNOMINATE BILAT MOD SED  10/28/2018  . IR ANGIO VERTEBRAL SEL SUBCLAVIAN INNOMINATE UNI R MOD SED  10/28/2018  . IR ANGIO VERTEBRAL SEL VERTEBRAL UNI L MOD SED  10/28/2018  . LIGATION OF ARTERIOVENOUS  FISTULA  05/22/2012   Procedure: LIGATION OF ARTERIOVENOUS  FISTULA;  Surgeon: Mal Misty, MD;  Location: [place];  Service: Vascular;  Laterality: Left;  Left brachio-cephalic fistula  . LOWER EXTREMITY ANGIOGRAM N/A 06/14/2013   Procedure: LOWER EXTREMITY ANGIOGRAM;  Surgeon: Angelia Mould, MD;  Location: [facility] CATH LAB;  Service: Cardiovascular;  Laterality: N/A;  . NEPHRECTOMY TRANSPLANTED ORGAN    . OTHER SURGICAL HISTORY     Cyst removed from back  . PORTACATH PLACEMENT  2002  . RADIOLOGY WITH ANESTHESIA N/A 10/28/2018   Procedure: IR WITH ANESTHESIA;  Surgeon: Radiologist, Medication, MD;  Location: [place];  Service: Radiology;  Laterality: N/A;  . REMOVAL OF A DIALYSIS CATHETER    . RENAL BIOPSY, PERCUTANEOUS  2012  . STUMP REVISION Right 01/12/2020   Procedure: REVISION RIGHT BELOW KNEE AMPUTATION;  Surgeon: Newt Minion, MD;  Location: [place];  Service: Orthopedics;  Laterality: Right;  . TONSILLECTOMY    . VASCULAR SURGERY    . WHIPPLE PROCEDURE  2002   duodenal ca, Dr. Harlow Asa    Soc Hx - married 86 years. One daughter, one son. Lives with his wife. He worked as a Civil engineer, contracting.    reports that he has never smoked. He has never used smokeless tobacco. He reports that he does not drink alcohol and  does not use drugs.  Allergies  Allergen Reactions  . Metformin And Related Other (See Comments)    Has kidney problems    Family History  Problem Relation Age of Onset  . Cancer Mother   . Cancer Father        prostate cancer  . Heart disease Maternal Grandfather   . Stroke Paternal Grandmother      Prior to Admission medications   Medication Sig Start Date End Date Taking? Authorizing Provider  acetaminophen (TYLENOL) 325 MG tablet Take 2 tablets (650 mg total) by mouth every 6 (six) hours as needed for mild pain (or Fever >/= 101). 01/18/20   Georgette Shell, MD  amLODipine (  NORVASC) 10 MG tablet Take 1 tablet (10 mg total) by mouth daily. 05/29/20   O'Neal, Cassie Freer, MD  apixaban (ELIQUIS) 5 MG TABS tablet Take 1 tablet (5 mg total) by mouth 2 (two) times daily. 01/31/20   Angiulli, Lavon Paganini, PA-C  aspirin 81 MG chewable tablet Chew 81 mg by mouth daily.    [provider]  atorvastatin (LIPITOR) 10 MG tablet Take 1 tablet (10 mg total) by mouth every Monday, Wednesday, and Friday. 01/31/20   Angiulli, Lavon Paganini, PA-C  bisacodyl (DULCOLAX) 5 MG EC tablet Take 2 tablets (10 mg total) by mouth daily. 01/19/20   Georgette Shell, MD  [employer]S D3 50 MCG (2000 UT) CAPS Take 2 capsules (4,000 Units total) by mouth daily. 01/31/20   Angiulli, Lavon Paganini, PA-C  Darbepoetin Alfa (ARANESP) 60 MCG/0.3ML SOSY injection Inject 0.3 mLs (60 mcg total) into the vein every Thursday with hemodialysis. 02/03/20   Angiulli, Lavon Paganini, PA-C  docusate sodium (COLACE) 100 MG capsule Take 1 capsule (100 mg total) by mouth 2 (two) times daily. 01/18/20   Georgette Shell, MD  Insulin Glargine [facility]) 100 UNIT/ML Inject 8 Units into the skin daily. 01/31/20   Angiulli, Lavon Paganini, PA-C  lipase/protease/amylase (CREON) 12000-38000 units CPEP capsule Take 1 capsule (12,000 Units total) by mouth 3 (three) times daily before meals. 01/31/20   Angiulli, Lavon Paganini, PA-C  magnesium chloride  (SLOW-MAG) 64 MG TBEC SR tablet Take 2 tablets (128 mg total) by mouth 2 (two) times daily. Take at least 2 hours separate from tacrolimus. 01/31/20   Angiulli, Lavon Paganini, PA-C  metoprolol tartrate (LOPRESSOR) 25 MG tablet Take 1 tablet (25 mg total) by mouth 2 (two) times daily. 05/29/20   Geralynn Rile, MD  mycophenolate (MYFORTIC) 180 MG EC tablet Take 2 tablets (360 mg total) by mouth 2 (two) times daily. 01/31/20   Angiulli, Lavon Paganini, PA-C  omeprazole (PRILOSEC) 40 MG capsule Take 1 capsule (40 mg total) by mouth at bedtime. 01/31/20   Angiulli, Lavon Paganini, PA-C  polyethylene glycol (MIRALAX / GLYCOLAX) 17 g packet Take 17 g by mouth daily. 01/19/20   Georgette Shell, MD  predniSONE (DELTASONE) 5 MG tablet Take 1 tablet (5 mg total) by mouth daily with breakfast. 01/31/20   Angiulli, Lavon Paganini, PA-C  sertraline (ZOLOFT) 50 MG tablet Take 1 tablet (50 mg total) by mouth at bedtime. 01/31/20   Angiulli, Lavon Paganini, PA-C  tacrolimus (PROGRAF) 1 MG capsule Take 2-3 mg by mouth See admin instructions. Take 3 mg by mouth in the morning at 9 AM and 2 mg in the evening at 9 PM    [provider]  tacrolimus (PROGRAF) 1 MG capsule Take 2 capsules (2 mg total) by mouth 2 (two) times daily. 01/31/20   Angiulli, Lavon Paganini, PA-C  tamsulosin (FLOMAX) 0.4 MG CAPS capsule Take 1 capsule (0.4 mg total) by mouth daily after supper. 01/31/20   Cathlyn Parsons, PA-C    Physical Exam: Vitals:   06/11/20 1617 06/11/20 1625 06/11/20 1630 06/11/20 1640  BP:   (!) 148/100   Pulse: (!) 121 (!) 119 (!) 139   Resp: (!) 30 (!) 26 17   Temp:    (!) 100.6 F (38.1 C)  TempSrc:    Oral  SpO2: 94% (!) 86% (!) 88%      Vitals:   06/11/20 1617 06/11/20 1625 06/11/20 1630 06/11/20 1640  BP:   (!) 148/100   Pulse: [name])  121 (!) 119 (!) 139   Resp: (!) 30 (!) 26 17   Temp:    (!) 100.6 F (38.1 C)  TempSrc:    Oral  SpO2: 94% (!) 86% (!) 88%    General:  Gaunt, chronically appearing man in no distress,  resting comfortably Eyes: PERRL, lids with scant exudate and conjunctivae normal ENMT: Mucous membranes are dry. .  Neck: normal, supple, no masses, no thyromegaly Respiratory: decreased breath sounds, decreased diaphramatic excursion, no wheezing, feint bibasilar crackles.  No accessory muscle use.  Cardiovascular: Regular tachycardia , no murmurs / rubs / gallops. No extremity edema. 2+ pedal pulses. No carotid bruits.  Abdomen:  Scaphoid, thin,well healed surgical scar across upper abdomen, no tenderness, no masses palpated. No hepatosplenomegaly. Bowel sounds positive.  Musculoskeletal: no clubbing / cyanosis. No joint deformity upper extremities. Bilateral BKAs. Good ROM, no contractures. Decreased muscle tone.  Skin: no rashes, lesions, ulcers. No induration Neurologic: CN 2-12 grossly intact. 4/5 MS in all 4.  Psychiatric: Normal judgment and insight. Alert and oriented x 3. Very flat affect    Labs on Admission: I have personally reviewed following labs and imaging studies  CBC: Recent Labs  Lab 06/11/20 1326  WBC 7.6  NEUTROABS 5.7  HGB 12.4*  HCT 43.1  MCV 91.5  PLT 683*   Basic Metabolic Panel: Recent Labs  Lab 06/11/20 1326  NA 140  K 3.9  CL 101  CO2 23  GLUCOSE 232*  BUN 36*  CREATININE 5.78*  CALCIUM 8.2*   GFR: Estimated Creatinine Clearance: 13 mL/min (A) (by C-G formula based on SCr of 5.78 mg/dL (H)). Liver Function Tests: Recent Labs  Lab 06/11/20 1326  AST 23  ALT 11  ALKPHOS 80  BILITOT 1.1  PROT 6.4*  ALBUMIN 2.6*   No results for input(s): LIPASE, AMYLASE in the last 168 hours. No results for input(s): AMMONIA in the last 168 hours. Coagulation Profile: No results for input(s): INR, PROTIME in the last 168 hours. Cardiac Enzymes: No results for input(s): CKTOTAL, CKMB, CKMBINDEX, TROPONINI in the last 168 hours. BNP (last 3 results) No results for input(s): PROBNP in the last 8760 hours. HbA1C: No results for input(s): HGBA1C in  the last 72 hours. CBG: No results for input(s): GLUCAP in the last 168 hours. Lipid Profile: Recent Labs    06/11/20 1326  TRIG 83   Thyroid Function Tests: No results for input(s): TSH, T4TOTAL, FREET4, T3FREE, THYROIDAB in the last 72 hours. Anemia Panel: Recent Labs    06/11/20 1326  FERRITIN 740*   Urine analysis:    Component Value Date/Time   COLORURINE YELLOW 12/31/2019 1420   APPEARANCEUR HAZY (A) 12/31/2019 1420   LABSPEC 1.010 12/31/2019 1420   PHURINE 5.0 12/31/2019 1420   GLUCOSEU 150 (A) 12/31/2019 1420   HGBUR MODERATE (A) 12/31/2019 1420   BILIRUBINUR NEGATIVE 12/31/2019 1420   KETONESUR NEGATIVE 12/31/2019 1420   PROTEINUR >=300 (A) 12/31/2019 1420   UROBILINOGEN 0.2 08/02/2014 1424   NITRITE NEGATIVE 12/31/2019 1420   LEUKOCYTESUR MODERATE (A) 12/31/2019 1420    Radiological Exams on Admission: DG Chest 1 View  Result Date: 06/11/2020 CLINICAL DATA:  Shortness of breath, COVID-19 positive EXAM: CHEST  1 VIEW COMPARISON:  01/04/2020 FINDINGS: Mild cardiomegaly. Atherosclerotic calcification of the aortic knob. Diffuse interstitial prominence with streaky interstitial opacities most pronounced within the mid to lower lung fields. Small left pleural effusion. No pneumothorax. IMPRESSION: Diffuse interstitial prominence with streaky interstitial opacities within the mid to lower  lung fields and small left pleural effusion. Findings may reflect multifocal atypical/viral infection versus pulmonary edema. Electronically Signed   By: Davina Poke D.O.   On: 06/11/2020 14:15    EKG: Independently reviewed. A. Fib with RVR  Assessment/Plan Active Problems:   Pneumonia due to COVID-19 virus   Acute heart failure with preserved ejection fraction (HFpEF) (HCC)   PAF (paroxysmal atrial fibrillation) (HCC)   ESRD on hemodialysis (HCC)   DM (diabetes mellitus) (HCC)   GERD (gastroesophageal reflux disease)     1. Covid pneumonia - patient covid POSITIVE,  with elevated inflammatory markers, hypoxemia on presentation with O2 sat 89% that came up to 90's with 2 l O2. CXR positive for multi focal opacities. Plan Tele admit  Covid protocols  Remdesivir x 5 days  High dose steroids: IV solumedrol for 3 days followed by oral steroids  Oxygen support to keep O2 Sat >90%  Advised patient that if oxygen demand increases he may be a candidate for immunologics  2. HFpEF- abnormal echos Nov '18, Jan '20 with grade I-II diastolic dysfunction. No prior episodes of CHF. Now with CXR suggestive of pulmonary edema, BNP >4,500 Plan Echo  Lasix 40 mg IV q6 x 4 doses  Strict I/O's and daily weights  Continue home cardiac meds.  Cardiology consult  3. PAF/tachycardia - established h/o PAF. Recenly seen by Dr. Audie Box, for ardiology, for tachycardia associated with HD. In a fib with RVR on presentation but on exam appeared to be in sinus tach. D/c IV diltiazem. Plan Continue Norvasc, lopressor  Telemetry  Cardiology consult  4. ESRD - patient with failed transplant. On HD TuThSa missed this past Saturday Plan Continue home meds  Nephrology has seen patient and will manage HD  5. DM - on Lantus 8 u daily at home. Plan Continue Lantus - may need to increase dose 2/2 steroids  Sliding scale coverage.   6. GERD - continue PPI   DVT prophylaxis: continue Eliquis Code Status: full code, willing to be intubated if needed.  Family Communication: Spoke with Pamela,Seng, wife. Gave update on Dx and Tx plan.   Disposition Plan: Home when medically stable  Consults called: Nephrology, text msg to cardiology on call for non-urgent consult ( Admission status: inpatient-tele    Adella Hare MD [facility] Pager [phone]  If 7PM-7AM, please contact night-coverage [email or web address] Password [email or web address]  06/11/2020, 5:23 PM

## 2020-06-11 NOTE — ED Notes (Signed)
hospitalist at bedside

## 2020-06-11 NOTE — ED Notes (Signed)
Norins, MD made aware of pt failing swallow screen. Pt to receive lopressor via IV. See new orders.

## 2020-06-11 NOTE — ED Notes (Signed)
Attending Norins; aware of pt VS. No new orders given at this time. Will continue to monitor.

## 2020-06-11 NOTE — ED Notes (Signed)
Date and time results received: 06/11/20   Test: lactic acid Critical Value: 2.2  Name of Provider Notified: [place] PA

## 2020-06-11 NOTE — ED Triage Notes (Addendum)
Pt to ED via EMS from home c/o weakness Pt missed dialysis yesterday. Orientation x 4. COVID positive. On fire arrival o2 sats in 13s. History bilat BKA, Last VS: CBG 290, HR 130S 170/118, 92%RA. No medications given by EMS,.

## 2020-06-11 NOTE — ED Notes (Signed)
Date and time results received: 06/11/20  Test: COVID Critical Value: Positive  Name of Provider Notified: [place] PA

## 2020-06-12 ENCOUNTER — Inpatient Hospital Stay (HOSPITAL_COMMUNITY): Payer: Medicare Other

## 2020-06-12 ENCOUNTER — Other Ambulatory Visit (HOSPITAL_COMMUNITY): Payer: Medicare Other

## 2020-06-12 DIAGNOSIS — B952 Enterococcus as the cause of diseases classified elsewhere: Secondary | ICD-10-CM

## 2020-06-12 DIAGNOSIS — Z992 Dependence on renal dialysis: Secondary | ICD-10-CM

## 2020-06-12 DIAGNOSIS — N186 End stage renal disease: Secondary | ICD-10-CM | POA: Diagnosis not present

## 2020-06-12 DIAGNOSIS — R7881 Bacteremia: Secondary | ICD-10-CM | POA: Diagnosis not present

## 2020-06-12 DIAGNOSIS — U071 COVID-19: Secondary | ICD-10-CM

## 2020-06-12 LAB — COMPREHENSIVE METABOLIC PANEL
ALT: 11 U/L (ref 0–44)
AST: 17 U/L (ref 15–41)
Albumin: 2.2 g/dL — ABNORMAL LOW (ref 3.5–5.0)
Alkaline Phosphatase: 70 U/L (ref 38–126)
Anion gap: 15 (ref 5–15)
BUN: 49 mg/dL — ABNORMAL HIGH (ref 8–23)
CO2: 22 mmol/L (ref 22–32)
Calcium: 7.9 mg/dL — ABNORMAL LOW (ref 8.9–10.3)
Chloride: 103 mmol/L (ref 98–111)
Creatinine, Ser: 6.37 mg/dL — ABNORMAL HIGH (ref 0.61–1.24)
GFR, Estimated: 9 mL/min — ABNORMAL LOW (ref >60)
Glucose, Bld: 271 mg/dL — ABNORMAL HIGH (ref 70–99)
Potassium: 3.9 mmol/L (ref 3.5–5.1)
Sodium: 140 mmol/L (ref 135–145)
Total Bilirubin: 1 mg/dL (ref 0.3–1.2)
Total Protein: 5.5 g/dL — ABNORMAL LOW (ref 6.5–8.1)

## 2020-06-12 LAB — BLOOD CULTURE ID PANEL (REFLEXED) - BCID2
A.calcoaceticus-baumannii: NOT DETECTED
Bacteroides fragilis: NOT DETECTED
Candida albicans: NOT DETECTED
Candida auris: NOT DETECTED
Candida glabrata: NOT DETECTED
Candida krusei: NOT DETECTED
Candida parapsilosis: NOT DETECTED
Candida tropicalis: NOT DETECTED
Cryptococcus neoformans/gattii: NOT DETECTED
Enterobacter cloacae complex: NOT DETECTED
Enterobacterales: NOT DETECTED
Enterococcus Faecium: NOT DETECTED
Enterococcus faecalis: DETECTED — AB
Escherichia coli: NOT DETECTED
Haemophilus influenzae: NOT DETECTED
Klebsiella aerogenes: NOT DETECTED
Klebsiella oxytoca: NOT DETECTED
Klebsiella pneumoniae: NOT DETECTED
Listeria monocytogenes: NOT DETECTED
Neisseria meningitidis: NOT DETECTED
Proteus species: NOT DETECTED
Pseudomonas aeruginosa: NOT DETECTED
Salmonella species: NOT DETECTED
Serratia marcescens: NOT DETECTED
Staphylococcus aureus (BCID): NOT DETECTED
Staphylococcus epidermidis: NOT DETECTED
Staphylococcus lugdunensis: NOT DETECTED
Staphylococcus species: DETECTED — AB
Stenotrophomonas maltophilia: NOT DETECTED
Streptococcus agalactiae: NOT DETECTED
Streptococcus pneumoniae: NOT DETECTED
Streptococcus pyogenes: NOT DETECTED
Streptococcus species: NOT DETECTED
Vancomycin resistance: NOT DETECTED

## 2020-06-12 LAB — CBC WITH DIFFERENTIAL/PLATELET
Abs Immature Granulocytes: 0.02 10*3/uL (ref 0.00–0.07)
Basophils Absolute: 0 10*3/uL (ref 0.0–0.1)
Basophils Relative: 0 %
Eosinophils Absolute: 0 10*3/uL (ref 0.0–0.5)
Eosinophils Relative: 0 %
HCT: 36.6 % — ABNORMAL LOW (ref 39.0–52.0)
Hemoglobin: 11.2 g/dL — ABNORMAL LOW (ref 13.0–17.0)
Immature Granulocytes: 1 %
Lymphocytes Relative: 18 %
Lymphs Abs: 0.8 10*3/uL (ref 0.7–4.0)
MCH: 27.5 pg (ref 26.0–34.0)
MCHC: 30.6 g/dL (ref 30.0–36.0)
MCV: 89.9 fL (ref 80.0–100.0)
Monocytes Absolute: 0.1 10*3/uL (ref 0.1–1.0)
Monocytes Relative: 2 %
Neutro Abs: 3.5 10*3/uL (ref 1.7–7.7)
Neutrophils Relative %: 79 %
Platelets: 85 10*3/uL — ABNORMAL LOW (ref 150–400)
RBC: 4.07 MIL/uL — ABNORMAL LOW (ref 4.22–5.81)
RDW: 17.7 % — ABNORMAL HIGH (ref 11.5–15.5)
WBC: 4.4 10*3/uL (ref 4.0–10.5)
nRBC: 0 % (ref 0.0–0.2)

## 2020-06-12 LAB — URINALYSIS, ROUTINE W REFLEX MICROSCOPIC
Bilirubin Urine: NEGATIVE
Glucose, UA: 50 mg/dL — AB
Ketones, ur: 5 mg/dL — AB
Nitrite: NEGATIVE
Protein, ur: >=300 mg/dL — AB
Specific Gravity, Urine: 1.014 (ref 1.005–1.030)
WBC, UA: >50 WBC/hpf — ABNORMAL HIGH (ref 0–5)
pH: 5 (ref 5.0–8.0)

## 2020-06-12 LAB — HEMOGLOBIN A1C
Hgb A1c MFr Bld: 7.5 % — ABNORMAL HIGH (ref 4.8–5.6)
Mean Plasma Glucose: 168.55 mg/dL

## 2020-06-12 LAB — APTT
aPTT: 45 seconds — ABNORMAL HIGH (ref 24–36)
aPTT: 49 seconds — ABNORMAL HIGH (ref 24–36)

## 2020-06-12 LAB — HEPARIN LEVEL (UNFRACTIONATED): Heparin Unfractionated: <0.1 IU/mL — ABNORMAL LOW (ref 0.30–0.70)

## 2020-06-12 LAB — GLUCOSE, CAPILLARY
Glucose-Capillary: 200 mg/dL — ABNORMAL HIGH (ref 70–99)
Glucose-Capillary: 242 mg/dL — ABNORMAL HIGH (ref 70–99)
Glucose-Capillary: 241 mg/dL — ABNORMAL HIGH (ref 70–99)
Glucose-Capillary: 267 mg/dL — ABNORMAL HIGH (ref 70–99)
Glucose-Capillary: 195 mg/dL — ABNORMAL HIGH (ref 70–99)
Glucose-Capillary: 242 mg/dL — ABNORMAL HIGH (ref 70–99)
Glucose-Capillary: 182 mg/dL — ABNORMAL HIGH (ref 70–99)

## 2020-06-12 LAB — LIPID PANEL
Cholesterol: 100 mg/dL (ref 0–200)
HDL: 27 mg/dL — ABNORMAL LOW (ref >40)
LDL Cholesterol: 64 mg/dL (ref 0–99)
Total CHOL/HDL Ratio: 3.7 RATIO
Triglycerides: 47 mg/dL (ref <150)
VLDL: 9 mg/dL (ref 0–40)

## 2020-06-12 LAB — VITAMIN B12: Vitamin B-12: 1104 pg/mL — ABNORMAL HIGH (ref 180–914)

## 2020-06-12 LAB — TSH: TSH: 1.664 u[IU]/mL (ref 0.350–4.500)

## 2020-06-12 LAB — C-REACTIVE PROTEIN: CRP: 16.7 mg/dL — ABNORMAL HIGH (ref <1.0)

## 2020-06-12 LAB — MRSA PCR SCREENING: MRSA by PCR: NEGATIVE

## 2020-06-12 MED ORDER — VANCOMYCIN HCL 750 MG/150ML IV SOLN
750.0000 mg | INTRAVENOUS | Status: DC
  Administered 2020-06-13: 750 mg via INTRAVENOUS
  Filled 2020-06-12 (×3): qty 150

## 2020-06-12 MED ORDER — VANCOMYCIN HCL 1750 MG/350ML IV SOLN
1750.0000 mg | Freq: Once | INTRAVENOUS | Status: AC
  Administered 2020-06-12: 1750 mg via INTRAVENOUS
  Filled 2020-06-12: qty 350

## 2020-06-12 MED ORDER — RESOURCE THICKENUP CLEAR PO POWD
ORAL | Status: DC | PRN
  Filled 2020-06-12: qty 125

## 2020-06-12 MED ORDER — IPRATROPIUM-ALBUTEROL 20-100 MCG/ACT IN AERS
1.0000 | INHALATION_SPRAY | Freq: Four times a day (QID) | RESPIRATORY_TRACT | Status: DC
  Administered 2020-06-12 – 2020-06-13 (×4): 1 via RESPIRATORY_TRACT
  Filled 2020-06-12: qty 4

## 2020-06-12 MED ORDER — METOPROLOL TARTRATE 5 MG/5ML IV SOLN: 5.0000 mg | INTRAVENOUS | Status: DC | PRN

## 2020-06-12 NOTE — Consult Note (Signed)
[facility]ious [facility]    Date of Admission:  06/11/2020     Total days of antibiotics 1  Day 1 vancomycin         Reason for Consult: Bacteremia     Referring Provider: auto consultation  Primary Care Provider: [name], MD   Assessment: Paul [name] is a 65 y.o. immunosuppressed (s/p failed kidney transplant) dialysis dependent male here for evaluation of generalized weakness in the setting of known COVID-19 infection. He was found to be hypoxic and recommended admission to the hospital for care and has been started on IV remdesivir and IV steroids.   He was also found to have bacteremia with enterococcus faecalis and staphylococcus species, not staph aureus. Will start him on IV vancomycin for now to treat bacteremia. Likely the staphylococcus is skin contaminant but given his immunosuppressed state would investigate further given enterococus. No source identified at this time. Will start with transthoracic echo and repeat blood cultures to assess for endocarditis. Would hold on central access if hemodynamics permit. He may need a TEE if low quality study for otherwise unexplained bacteremia.   COVID treatment per primary team - he seems to be feeling better since admission.     Plan: 1. Start vancomycin IV  2. Repeat blood cultures tomorrow  3. Transthoracic echocardiogram (may need TEE if low quality)     Principal Problem:   Bacteremia due to Enterococcus Active Problems:   Pneumonia due to COVID-19 virus   ESRD on hemodialysis (HCC)   DM (diabetes mellitus) (HCC)   GERD (gastroesophageal reflux disease)   PAF (paroxysmal atrial fibrillation) (HCC)   Acute heart failure with preserved ejection fraction (HFpEF) ([place])   . amLODipine  10 mg Oral Daily  . aspirin  81 mg Oral Daily  . atorvastatin  10 mg Oral Q M,W,F  . bisacodyl  10 mg Oral Daily  . [START ON 06/15/2020] Darbepoetin Alfa  60 mcg Intravenous Q Thu-HD  . docusate sodium   100 mg Oral BID  . insulin aspart  0-20 Units Subcutaneous TID WC  . insulin glargine  8 Units Subcutaneous Daily  . Ipratropium-Albuterol  1 puff Inhalation Q6H  . lipase/protease/amylase  12,000 Units Oral TID AC  . magnesium chloride  2 tablet Oral BID  . methylPREDNISolone (SOLU-MEDROL) injection  1 mg/kg Intravenous Q12H   Followed by  . [START ON 06/14/2020] predniSONE  50 mg Oral Daily  . metoprolol tartrate  5 mg Intravenous Q6H  . metoprolol tartrate  25 mg Oral BID  . pantoprazole  80 mg Oral Daily  . polyethylene glycol  17 g Oral Daily  . sertraline  50 mg Oral QHS  . sodium chloride flush  3 mL Intravenous Q12H  . tacrolimus  2 mg Oral BID  . tamsulosin  0.4 mg Oral QPC supper    HPI: Paul [name] is a 65 y.o. male admitted from home for evaluation of generalized weakness and fatigue.   Was diagnosed COVID positive with a cough and fever that started within the last 1 week. He had received primary COVID vaccines completed in March 2021. No booster has been received. In the ER he was hypoxic at 89% and started on 2 LPM with recovery to 94%. He was started on IV remdesivir and IV steroids to treat his COVID infection.   He tells me that up until he was diagnosed with COVID he was in his normal state  of health and function. He underwent b/l lower extremity amputations several years before with a revision of the Right BKA in August 2021 for infection. This has since been well healed and not a problem for him since. He did miss a session of dialysis due to severe weather.   During visit in ER blood cultures were also drawn with initial work up revealing Enterococcus faecalis and staphylococcus species (not staph aureus) in 1/3 bottles. He denies any urinary complaints (does still make and void urine about once every 1-2 days), denies any GI complaints, no wounds. No subacute illness described, pretty sudden onset with COVID this past 1 week. HD access is via RUE AVF - this has been  working normally without any pain/erythema or ulcerations overlying site. Feeling a little better since admission.    Review of Systems: Review of Systems  Constitutional: Positive for fever and malaise/fatigue.  HENT: Negative for sinus pain and sore throat.   Respiratory: Positive for cough and shortness of breath. Negative for sputum production.   Cardiovascular: Negative for chest pain, palpitations and orthopnea.  Gastrointestinal: Negative for abdominal pain, diarrhea, nausea and vomiting.  Genitourinary: Negative for dysuria.  Musculoskeletal: Negative for back pain, joint pain and myalgias.  Skin: Negative for rash.  Neurological: Positive for weakness. Negative for dizziness and focal weakness.    Past Medical History:  Diagnosis Date  . A-fib ([place])   . Anemia    esrd  . Calciphylaxis 05/2013   lower ext  . Cancer ([place])    hx duodenal adenoCa 2002, resected  . Closed left arm fracture 1967  . Corneal abrasion, left   . Depression   . Diabetes mellitus    Type 1 iddm x 11 yrs  . Diabetic neuropathy ([place])   . ESRD on hemodialysis ([place])    Pt has ESRD due to DM.  He had a L upper arm AVF prior to starting HD which was ligated due to L arm steal syndrome.  He started HD in Jan 2014 and did home HD.  The family couldn't cannulate the R arm AVF successfully so by mid 2014 they decided to switch to PD which was done late summer 2014.  In Jan 2015 he was admitted with FTT felt to be due to underdialysis on PD and PD was abandoned and he   . GERD (gastroesophageal reflux disease)   . Glomerulosclerosis, diabetic ([place])   . Heart murmur   . History of blood transfusion   . Hypertension    off of meds due to orthostatic hypotension  . Kidney transplant recipient   . Open wound of both legs with complication    Pt has had progressive wounds of both LE's including gangrene of the toes and patchy necrosis of the calves.  He has been treated at [facility] by Dr Jerline Pain with  hyperbaric O2 5d per week.  He is getting Na Thiosulfate with HD for suspected calciphylaxis. Pt says doctor's aren't sure if these ulcers were diabetic ulcers or calciphylaxis.  He underwent bilat BKA on 07/30/13.  He says that the woun    Social History   Tobacco Use  . Smoking status: Never Smoker  . Smokeless tobacco: Never Used  Vaping Use  . Vaping Use: Never used  Substance Use Topics  . Alcohol use: No  . Drug use: No    Family History  Problem Relation Age of Onset  . Cancer Mother   . Cancer Father  prostate cancer  . Heart disease Maternal Grandfather   . Stroke Paternal Grandmother    Allergies  Allergen Reactions  . Metformin And Related Other (See Comments)    Has kidney problems    OBJECTIVE: Blood pressure (!) 143/82, pulse (!) 56, temperature 98 F (36.7 C), resp. rate 18, height 6' (1.829 m), weight 72.9 kg, SpO2 96 %.  Physical Exam Constitutional:      Comments: Lethargic in bed but easily responds and participates in full conversation   Cardiovascular:     Rate and Rhythm: Normal rate and regular rhythm.     Heart sounds: No murmur heard.     Arteriovenous access: right arteriovenous access is present.    Comments: +bruit + thrill  Pulmonary:     Effort: Pulmonary effort is normal.     Breath sounds: Normal breath sounds. No decreased breath sounds.  Abdominal:     General: Bowel sounds are normal.     Palpations: Abdomen is soft.     Tenderness: There is no abdominal tenderness.  Musculoskeletal:     Comments: Well healed surgical incision bilaterally   Skin:    General: Skin is warm and dry.     Capillary Refill: Capillary refill takes less than 2 seconds.  Neurological:     General: No focal deficit present.     Mental Status: He is oriented to person, place, and time.     Lab Results Lab Results  Component Value Date   WBC 4.4 06/12/2020   HGB 11.2 (L) 06/12/2020   HCT 36.6 (L) 06/12/2020   MCV 89.9 06/12/2020   PLT 85  (L) 06/12/2020    Lab Results  Component Value Date   CREATININE 6.37 (H) 06/12/2020   BUN 49 (H) 06/12/2020   NA 140 06/12/2020   K 3.9 06/12/2020   CL 103 06/12/2020   CO2 22 06/12/2020    Lab Results  Component Value Date   ALT 11 06/12/2020   AST 17 06/12/2020   ALKPHOS 70 06/12/2020   BILITOT 1.0 06/12/2020     Microbiology: Recent Results (from the past 240 hour(s))  SARS Coronavirus 2 by RT PCR (hospital order, performed in [facility] hospital lab) Nasopharyngeal Nasopharyngeal Swab     Status: Abnormal   Collection Time: 06/11/20 12:36 PM   Specimen: Nasopharyngeal Swab  Result Value Ref Range Status   SARS Coronavirus 2 POSITIVE (A) NEGATIVE Final    Comment: RESULT CALLED TO, READ BACK BY AND VERIFIED WITH: RN A Esperanza Heir [id number] AT 1442 BY CM (NOTE) SARS-CoV-2 target nucleic acids are DETECTED  SARS-CoV-2 RNA is generally detectable in upper respiratory specimens  during the acute phase of infection.  Positive results are indicative  of the presence of the identified virus, but do not rule out bacterial infection or co-infection with other pathogens not detected by the test.  Clinical correlation with patient history and  other diagnostic information is necessary to determine patient infection status.  The expected result is negative.  Fact Sheet for Patients:   StrictlyIdeas.no   Fact Sheet for Healthcare Providers:   BankingDealers.co.za    This test is not yet approved or cleared by the [place] FDA and  has been authorized for detection and/or diagnosis of SARS-CoV-2 by FDA under an Emergency Use Authorization (EUA).  This EUA will remain in effect (meaning this tes t can be used) for the duration of  the COVID-19 declaration under Section 564(b)(1) of the Act, 21 U.S.C. section 360-bbb-3(b)(1),  unless the authorization is terminated or revoked sooner.  Performed at [facility], [place]  [address], [place], [place] [address]   Blood Culture (routine x 2)     Status: None (Preliminary result)   Collection Time: 06/11/20  1:26 PM   Specimen: BLOOD  Result Value Ref Range Status   Specimen Description BLOOD BLOOD LEFT FOREARM  Final   Special Requests   Final    BOTTLES DRAWN AEROBIC ONLY Blood Culture adequate volume   Culture  Setup Time   Final    GRAM POSITIVE COCCI IN CHAINS AEROBIC BOTTLE ONLY Organism ID to follow CRITICAL RESULT CALLED TO, READ BACK BY AND VERIFIED WITH: A. Rogers Blocker PharmD 9:30 06/12/20 ([email or web address]) Performed at [facility], [place] [address] [place], [place] [address]    Culture GRAM POSITIVE COCCI  Final   Report Status PENDING  Incomplete  Blood Culture ID Panel (Reflexed)     Status: Abnormal   Collection Time: 06/11/20  1:26 PM  Result Value Ref Range Status   Enterococcus faecalis DETECTED (A) NOT DETECTED Final    Comment: CRITICAL RESULT CALLED TO, READ BACK BY AND VERIFIED WITH: A. Rogers Blocker PharmD 9:30 06/12/20 ([email or web address])    Enterococcus Faecium NOT DETECTED NOT DETECTED Final   Listeria monocytogenes NOT DETECTED NOT DETECTED Final   Staphylococcus species DETECTED (A) NOT DETECTED Final    Comment: CRITICAL RESULT CALLED TO, READ BACK BY AND VERIFIED WITH: A. Rogers Blocker PharmD 9:30 06/12/20 ([email or web address])    Staphylococcus aureus (BCID) NOT DETECTED NOT DETECTED Final   Staphylococcus epidermidis NOT DETECTED NOT DETECTED Final   Staphylococcus lugdunensis NOT DETECTED NOT DETECTED Final   Streptococcus species NOT DETECTED NOT DETECTED Final   Streptococcus agalactiae NOT DETECTED NOT DETECTED Final   Streptococcus pneumoniae NOT DETECTED NOT DETECTED Final   Streptococcus pyogenes NOT DETECTED NOT DETECTED Final   A.calcoaceticus-baumannii NOT DETECTED NOT DETECTED Final   Bacteroides fragilis NOT DETECTED NOT DETECTED Final   Enterobacterales NOT DETECTED NOT DETECTED Final   Enterobacter cloacae complex NOT DETECTED NOT DETECTED Final    Escherichia coli NOT DETECTED NOT DETECTED Final   Klebsiella aerogenes NOT DETECTED NOT DETECTED Final   Klebsiella oxytoca NOT DETECTED NOT DETECTED Final   Klebsiella pneumoniae NOT DETECTED NOT DETECTED Final   Proteus species NOT DETECTED NOT DETECTED Final   Salmonella species NOT DETECTED NOT DETECTED Final   Serratia marcescens NOT DETECTED NOT DETECTED Final   Haemophilus influenzae NOT DETECTED NOT DETECTED Final   Neisseria meningitidis NOT DETECTED NOT DETECTED Final   Pseudomonas aeruginosa NOT DETECTED NOT DETECTED Final   Stenotrophomonas maltophilia NOT DETECTED NOT DETECTED Final   Candida albicans NOT DETECTED NOT DETECTED Final   Candida auris NOT DETECTED NOT DETECTED Final   Candida glabrata NOT DETECTED NOT DETECTED Final   Candida krusei NOT DETECTED NOT DETECTED Final   Candida parapsilosis NOT DETECTED NOT DETECTED Final   Candida tropicalis NOT DETECTED NOT DETECTED Final   Cryptococcus neoformans/gattii NOT DETECTED NOT DETECTED Final   Vancomycin resistance NOT DETECTED NOT DETECTED Final    Comment: Performed at [facility], [address] [place], [place] [address]  MRSA PCR Screening     Status: None   Collection Time: 06/12/20 12:52 AM   Specimen: Nasal Mucosa; Nasopharyngeal  Result Value Ref Range Status   MRSA by PCR NEGATIVE NEGATIVE Final    Comment:        The GeneXpert MRSA Assay (FDA approved for  NASAL specimens only), is one component of a comprehensive MRSA colonization surveillance program. It is not intended to diagnose MRSA infection nor to guide or monitor treatment for MRSA infections. Performed at [facility], [place] [address] [place], [place] [address]     Janene Madeira, MSN, NP-C [facility] for Infectious [place] Cell: [phone] Pager: [phone]  06/12/2020 12:00 PM

## 2020-06-12 NOTE — Progress Notes (Signed)
PHARMACY - PHYSICIAN COMMUNICATION CRITICAL VALUE ALERT - BLOOD CULTURE IDENTIFICATION (BCID)  Paul [name] is an 65 y.o. male who presented to [facility] on 06/11/2020 with a chief complaint of SOB and myalgias found to have COVID pneumonia. Patient with history of ESRD on HD. Currently on remdesivir and steroids. Patient now with 1/3 blood culture bottles growing E. Faecalis and Staph spp. (likely coagulase negative staph) - only GPCs in chains noted on gram stain.   Assessment:  BCx 1/3 E. Faecalis and Staph spp.  Name of physician (or Provider) Contacted: Dr. Reesa Chew, Dr. Baxter Flattery  Current antibiotics: None  Changes to prescribed antibiotics recommended:  Will initiate antibiotics per ID - pending consult this AM   Results for orders placed or performed during the hospital encounter of 06/11/20  Blood Culture ID Panel (Reflexed) (Collected: 06/11/2020  1:26 PM)  Result Value Ref Range   Enterococcus faecalis DETECTED (A) NOT DETECTED   Enterococcus Faecium NOT DETECTED NOT DETECTED   Listeria monocytogenes NOT DETECTED NOT DETECTED   Staphylococcus species DETECTED (A) NOT DETECTED   Staphylococcus aureus (BCID) NOT DETECTED NOT DETECTED   Staphylococcus epidermidis NOT DETECTED NOT DETECTED   Staphylococcus lugdunensis NOT DETECTED NOT DETECTED   Streptococcus species NOT DETECTED NOT DETECTED   Streptococcus agalactiae NOT DETECTED NOT DETECTED   Streptococcus pneumoniae NOT DETECTED NOT DETECTED   Streptococcus pyogenes NOT DETECTED NOT DETECTED   A.calcoaceticus-baumannii NOT DETECTED NOT DETECTED   Bacteroides fragilis NOT DETECTED NOT DETECTED   Enterobacterales NOT DETECTED NOT DETECTED   Enterobacter cloacae complex NOT DETECTED NOT DETECTED   Escherichia coli NOT DETECTED NOT DETECTED   Klebsiella aerogenes NOT DETECTED NOT DETECTED   Klebsiella oxytoca NOT DETECTED NOT DETECTED   Klebsiella pneumoniae NOT DETECTED NOT DETECTED   Proteus species NOT DETECTED NOT DETECTED    Salmonella species NOT DETECTED NOT DETECTED   Serratia marcescens NOT DETECTED NOT DETECTED   Haemophilus influenzae NOT DETECTED NOT DETECTED   Neisseria meningitidis NOT DETECTED NOT DETECTED   Pseudomonas aeruginosa NOT DETECTED NOT DETECTED   Stenotrophomonas maltophilia NOT DETECTED NOT DETECTED   Candida albicans NOT DETECTED NOT DETECTED   Candida auris NOT DETECTED NOT DETECTED   Candida glabrata NOT DETECTED NOT DETECTED   Candida krusei NOT DETECTED NOT DETECTED   Candida parapsilosis NOT DETECTED NOT DETECTED   Candida tropicalis NOT DETECTED NOT DETECTED   Cryptococcus neoformans/gattii NOT DETECTED NOT DETECTED   Vancomycin resistance NOT DETECTED NOT DETECTED   Alfonse Spruce, PharmD PGY2 ID Pharmacy Resident [id number]  06/12/2020  9:39 AM

## 2020-06-12 NOTE — Progress Notes (Signed)
[name] Progress Note  Subjective: pt seen in room, lethargic, but not in distress and awakens easily, no c/o    Background on admission: Paul [name] is a 65 y.o. 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 Vitals:   06/12/20 0213 06/12/20 0517 06/12/20 1020 06/12/20 1037  BP: (!) 139/103 (!) 155/90  (!) 143/82  Pulse: (!) 129 60  (!) 56  Resp: 18 20  18   Temp: 100 F (37.8 C) 99.8 F (37.7 C)  98 F (36.7 C)  TempSrc: Axillary Axillary    SpO2: 99% 97% 95% 96%  Weight:      Height:        Exam:   alert, very chronically ill, frail, nad   no jvd  Chest cta bilat  Cor reg no RG  Abd soft ntnd no ascites   Ext B BKA, no stump edema   Alert, NF, ox3, gen 'd weakness   RFA AVF +Bruit        OP HD: TTS NW  3.5h   66kh  3K/2.25 bath  RFA AVF  Hep none  - hect 2 ug  - mircera 200 q2 , last 1/13   CXR 1/23 - IMPRESSION: Diffuse interstitial prominence with streaky interstitial opacities within the mid to lower lung fields and small left pleural effusion. Findings may reflect multifocal atypical/viral infection versus pulmonary edema.  Assessment/ Plan: 1.  COVID-19 pneumonia: With O2 requirement. Per admitting team. 2.  Tachycardia/A-fib RVR: S/p Diltiazem. Per primary team. 3.  Hypoxemia: on 2 L , possibly some edema by CXR in addition to patchy infiltrates 4.  ESRD: Usual TTS schedule - missed HD Sat 1/22. HD today off schedule.  5.  Hypertension/volume: BP high, up 6kg by wt's.  Max UF w/ HD today.  6.  Anemia: Hgb > 12, no ESA for now. 7.  Metabolic bone disease: CorrCa ok, Phos pending. Resume home binders (Renvela). 8.  T1DM: Insulin per primary   Paul [name] 06/12/2020, 2:20 PM   Recent Labs  Lab 06/11/20 1326 06/12/20 0907  K 3.9 3.9  BUN 36* 49*  CREATININE 5.78*  6.37*  CALCIUM 8.2* 7.9*  HGB 12.4* 11.2*   Inpatient medications: . amLODipine  10 mg Oral Daily  . aspirin  81 mg Oral Daily  . atorvastatin  10 mg Oral Q M,W,F  . bisacodyl  10 mg Oral Daily  . [START ON 06/15/2020] Darbepoetin Alfa  60 mcg Intravenous Q Thu-HD  . docusate sodium  100 mg Oral BID  . insulin aspart  0-20 Units Subcutaneous TID WC  . insulin glargine  8 Units Subcutaneous Daily  . Ipratropium-Albuterol  1 puff Inhalation Q6H  . lipase/protease/amylase  12,000 Units Oral TID AC  . magnesium chloride  2 tablet Oral BID  . methylPREDNISolone (SOLU-MEDROL) injection  1 mg/kg Intravenous Q12H   Followed by  . [START ON 06/14/2020] predniSONE  50 mg Oral Daily  . metoprolol tartrate  5 mg Intravenous Q6H  . metoprolol tartrate  25 mg Oral BID  . pantoprazole  80 mg Oral Daily  . polyethylene glycol  17 g Oral Daily  . sertraline  50 mg Oral QHS  . sodium chloride flush  3 mL Intravenous Q12H  . tacrolimus  2 mg Oral BID  . tamsulosin  0.4 mg Oral QPC  supper   . sodium chloride    . heparin 1,300 Units/hr (06/12/20 1152)  . remdesivir 100 mg in NS 100 mL 100 mg (06/12/20 1034)  . vancomycin 1,750 mg (06/12/20 1352)  . [START ON 06/13/2020] vancomycin     sodium chloride, acetaminophen, chlorpheniramine-HYDROcodone, guaiFENesin-dextromethorphan, metoprolol tartrate, Resource ThickenUp Clear, sodium chloride flush, traMADol

## 2020-06-12 NOTE — Progress Notes (Signed)
PROGRESS NOTE    Paul [name]  KTG:[id number] DOB: 04-24-1956 DOA: 06/11/2020 PCP: [name], MD   Brief Narrative:  65 year old with history of paroxysmal A. fib, PAD, ESRD with failed renal transplant on HD TTS, DM2, diastolic CHF presented with complaints of shortness of breath after missing dialysis.  COVID-19 positive.  Chest x-ray showed multifocal opacities with left sided pleural effusion.  He was found to have COVID-19 pneumonia, atrial fibrillation with RVR and concerns of CHF exacerbation.  Blood cultures growing Enterococcus started on vancomycin.  Infectious disease consulted.   Assessment & Plan:   Active Problems:   ESRD on hemodialysis (HCC)   DM (diabetes mellitus) ([place])   GERD (gastroesophageal reflux disease)   PAF (paroxysmal atrial fibrillation) (HCC)   Pneumonia due to COVID-19 virus   Acute heart failure with preserved ejection fraction (HFpEF) (HCC)  Acute respiratory distress -Multiple etiology given underlying COVID-19 pneumonia, fluid overload from missing dialysis.  Continue supplemental oxygen as needed.  Would benefit from dialysis  Enterococcus bacteremia -Potential UA as a source.  IV vancomycin started -ID following  COVID-19 pneumonia -Chest x-ray showing bilateral opacities.  Pulmonary edema -Solu-Medrol-day 2 -Remdesivir-day 2 -Procalcitonin-0.44 -BNP- >4500 -Incentive spirometer, flutter valve -Follow inflammatory markers  Acute metabolic encephalopathy -No acute findings but it shows interval lacunar infarct? -On aspirin and statin -Check A1c, lipid panel risk factor modification -UA-positive -TSH, B12 and folate  ESRD on hemodialysis, failed renal transplant.  TTS -Seen by nephrology.  HD plans per nephrology  Congestive heart failure with preserved ejection fraction -Echo 01/2019-EF 65%.  Currently in volume overload which should improve with hemodialysis.  Paroxysmal atrial fibrillation with infrequent RVR -On Norvasc and  Lopressor. Ziio patch was ordered outpatient.  -Home Eliquis on hold until mentation improves -Heparin drip  Diabetes mellitus type 2 -Lantus 8 units daily.  Insulin sliding scale and Accu-Chek.  Essential hypertension -Norvasc 10 mg daily.  Metoprolol 25 mg twice daily  GERD -PPI  Depression -Zoloft at bedtime  BPH -Flomax  DVT prophylaxis: Hep gtt Code Status: Full code Family Communication:    Status is: Inpatient  Remains inpatient appropriate because:IV treatments appropriate due to intensity of illness or inability to take PO   Dispo: The patient is from: Home              Anticipated d/c is to: Home              Anticipated d/c date is: 2 days              Patient currently is not medically stable to d/c.   Difficult to place patient No       Body mass index is 21.8 kg/m.  Pressure Injury 01/18/20 Sacrum Stage 2 -  Partial thickness loss of dermis presenting as a shallow open injury with a red, pink wound bed without slough. (Active)  01/18/20 2251  Location: Sacrum  Location Orientation:   Staging: Stage 2 -  Partial thickness loss of dermis presenting as a shallow open injury with a red, pink wound bed without slough.  Wound Description (Comments):   Present on Admission: Yes      Subjective: Mild dyspnea with long sentences.  Denies any other complaints.  Does not use any oxygen at home currently on 2-3 L nasal cannula.  Review of Systems Otherwise negative except as per HPI, including: General: Denies fever, chills, night sweats or unintended weight loss. Resp: Denies cough, wheezing, shortness of breath. Cardiac: Denies chest pain,  palpitations, orthopnea, paroxysmal nocturnal dyspnea. GI: Denies abdominal pain, nausea, vomiting, diarrhea or constipation GU: Denies dysuria, frequency, hesitancy or incontinence MS: Denies muscle aches, joint pain or swelling Neuro: Denies headache, neurologic deficits (focal weakness, numbness, tingling),  abnormal gait Psych: Denies anxiety, depression, SI/HI/AVH Skin: Denies new rashes or lesions ID: Denies sick contacts, exotic exposures, travel  Examination:  Constitutional: Not in acute distress, on 2 L nasal cannula Respiratory: Tachypnea.  Bilateral diffuse rhonchi Cardiovascular: Sinus tachycardia, no rubs Abdomen: Nontender nondistended good bowel sounds Musculoskeletal: No edema noted Skin: No rashes seen Neurologic: CN 2-12 grossly intact.  And nonfocal Psychiatric: Normal judgment and insight. Alert and oriented x 3. Normal mood.  Right upper extremity fistula in place  Objective: Vitals:   06/11/20 2245 06/11/20 2300 06/12/20 0213 06/12/20 0517  BP: 135/89  (!) 139/103 (!) 155/90  Pulse: (!) 116  (!) 129 60  Resp: 16  18 20   Temp: 99 F (37.2 C)  100 F (37.8 C) 99.8 F (37.7 C)  TempSrc: Oral  Axillary Axillary  SpO2: 97%  99% 97%  Weight:  72.9 kg    Height:  6' (1.829 m)      Intake/Output Summary (Last 24 hours) at 06/12/2020 0756 Last data filed at 06/12/2020 0500 Gross per 24 hour  Intake 357.4 ml  Output --  Net 357.4 ml   Filed Weights   06/11/20 2300  Weight: 72.9 kg     Data Reviewed:   CBC: Recent Labs  Lab 06/11/20 1326  WBC 7.6  NEUTROABS 5.7  HGB 12.4*  HCT 43.1  MCV 91.5  PLT 935*   Basic Metabolic Panel: Recent Labs  Lab 06/11/20 1326  NA 140  K 3.9  CL 101  CO2 23  GLUCOSE 232*  BUN 36*  CREATININE 5.78*  CALCIUM 8.2*   GFR: Estimated Creatinine Clearance: 13 mL/min (A) (by C-G formula based on SCr of 5.78 mg/dL (H)). Liver Function Tests: Recent Labs  Lab 06/11/20 1326  AST 23  ALT 11  ALKPHOS 80  BILITOT 1.1  PROT 6.4*  ALBUMIN 2.6*   No results for input(s): LIPASE, AMYLASE in the last 168 hours. No results for input(s): AMMONIA in the last 168 hours. Coagulation Profile: No results for input(s): INR, PROTIME in the last 168 hours. Cardiac Enzymes: No results for input(s): CKTOTAL, CKMB, CKMBINDEX,  TROPONINI in the last 168 hours. BNP (last 3 results) No results for input(s): PROBNP in the last 8760 hours. HbA1C: No results for input(s): HGBA1C in the last 72 hours. CBG: Recent Labs  Lab 06/12/20 0211 06/12/20 0514  GLUCAP 242* 241*   Lipid Profile: Recent Labs    06/11/20 1326  TRIG 83   Thyroid Function Tests: No results for input(s): TSH, T4TOTAL, FREET4, T3FREE, THYROIDAB in the last 72 hours. Anemia Panel: Recent Labs    06/11/20 1326  FERRITIN 740*   Sepsis Labs: Recent Labs  Lab 06/11/20 1326  PROCALCITON 0.44  LATICACIDVEN 2.2*    Recent Results (from the past 240 hour(s))  SARS Coronavirus 2 by RT PCR (hospital order, performed in [facility] hospital lab) Nasopharyngeal Nasopharyngeal Swab     Status: Abnormal   Collection Time: 06/11/20 12:36 PM   Specimen: Nasopharyngeal Swab  Result Value Ref Range Status   SARS Coronavirus 2 POSITIVE (A) NEGATIVE Final    Comment: RESULT CALLED TO, READ BACK BY AND VERIFIED WITH: RN A SREIDER [id number] AT 1442 BY CM (NOTE) SARS-CoV-2 target nucleic acids are DETECTED  SARS-CoV-2 RNA is generally detectable in upper respiratory specimens  during the acute phase of infection.  Positive results are indicative  of the presence of the identified virus, but do not rule out bacterial infection or co-infection with other pathogens not detected by the test.  Clinical correlation with patient history and  other diagnostic information is necessary to determine patient infection status.  The expected result is negative.  Fact Sheet for Patients:   StrictlyIdeas.no   Fact Sheet for Healthcare Providers:   BankingDealers.co.za    This test is not yet approved or cleared by the [place] FDA and  has been authorized for detection and/or diagnosis of SARS-CoV-2 by FDA under an Emergency Use Authorization (EUA).  This EUA will remain in effect (meaning this tes t can be  used) for the duration of  the COVID-19 declaration under Section 564(b)(1) of the Act, 21 U.S.C. section 360-bbb-3(b)(1), unless the authorization is terminated or revoked sooner.  Performed at [facility], [place] [address] [place], [place] [address]   Blood Culture (routine x 2)     Status: None (Preliminary result)   Collection Time: 06/11/20  1:26 PM   Specimen: BLOOD  Result Value Ref Range Status   Specimen Description BLOOD BLOOD LEFT FOREARM  Final   Special Requests AEROBIC BOTTLE ONLY Blood Culture adequate volume  Final   Culture  Setup Time   Final    GRAM POSITIVE COCCI IN CHAINS AEROBIC BOTTLE ONLY Organism ID to follow Performed at [facility], [place] [address], [place], [place] [address]    Culture GRAM POSITIVE COCCI  Final   Report Status PENDING  Incomplete  MRSA PCR Screening     Status: None   Collection Time: 06/12/20 12:52 AM   Specimen: Nasal Mucosa; Nasopharyngeal  Result Value Ref Range Status   MRSA by PCR NEGATIVE NEGATIVE Final    Comment:        The GeneXpert MRSA Assay (FDA approved for NASAL specimens only), is one component of a comprehensive MRSA colonization surveillance program. It is not intended to diagnose MRSA infection nor to guide or monitor treatment for MRSA infections. Performed at [facility], [place] [address], [place], [place] [address]          Radiology Studies: DG Chest 1 View  Result Date: 06/11/2020 CLINICAL DATA:  Shortness of breath, COVID-19 positive EXAM: CHEST  1 VIEW COMPARISON:  01/04/2020 FINDINGS: Mild cardiomegaly. Atherosclerotic calcification of the aortic knob. Diffuse interstitial prominence with streaky interstitial opacities most pronounced within the mid to lower lung fields. Small left pleural effusion. No pneumothorax. IMPRESSION: Diffuse interstitial prominence with streaky interstitial opacities within the mid to lower lung fields and small left pleural effusion. Findings may  reflect multifocal atypical/viral infection versus pulmonary edema. Electronically Signed   By: Davina Poke D.O.   On: 06/11/2020 14:15   CT HEAD WO CONTRAST  Result Date: 06/12/2020 CLINICAL DATA:  Altered mental status EXAM: CT HEAD WITHOUT CONTRAST TECHNIQUE: Contiguous axial images were obtained from the base of the skull through the vertex without intravenous contrast. COMPARISON:  MRI 10/29/2018, CT FINDINGS: Brain: Mild parenchymal volume loss is present, commensurate with the patient's age. Mild periventricular white matter changes are present likely reflecting the sequela of small vessel ischemia, stable since prior examination. Tiny remote lacunar infarcts developed within the left cerebellar hemisphere, new since prior examination. No acute intracranial hemorrhage or infarct. No abnormal mass effect or midline shift. No abnormal  intra or extra-axial mass lesion or fluid collection. Ventricular size is normal. Vascular: No asymmetric hyperdense vasculature at the skull base. Moderate atherosclerotic calcification within the carotid siphons and distal vertebral arteries. Skull: Intact Sinuses/Orbits: There is complete opacification of the left maxillary sinus, left ethmoid air cells, and left frontal sinus. Moderate mucosal thickening within the right sphenoid sinus. The orbits are unremarkable. Other: The mastoid air cells and middle ear cavities are clear. IMPRESSION: No evidence of acute intracranial hemorrhage or infarct. Interval development of tiny remote lacunar infarct within the left cerebellar hemisphere. Mild senescent change, stable since prior examination. Extensive left-sided paranasal sinus disease. Electronically Signed   By: Fidela Salisbury MD   On: 06/12/2020 01:40        Scheduled Meds: . amLODipine  10 mg Oral Daily  . aspirin  81 mg Oral Daily  . atorvastatin  10 mg Oral Q M,W,F  . bisacodyl  10 mg Oral Daily  . [START ON 06/15/2020] Darbepoetin Alfa  60 mcg  Intravenous Q Thu-HD  . docusate sodium  100 mg Oral BID  . furosemide  40 mg Intravenous Q6H  . insulin aspart  0-20 Units Subcutaneous TID WC  . insulin glargine  8 Units Subcutaneous Daily  . lipase/protease/amylase  12,000 Units Oral TID AC  . magnesium chloride  2 tablet Oral BID  . methylPREDNISolone (SOLU-MEDROL) injection  1 mg/kg Intravenous Q12H   Followed by  . [START ON 06/14/2020] predniSONE  50 mg Oral Daily  . metoprolol tartrate  5 mg Intravenous Q6H  . metoprolol tartrate  25 mg Oral BID  . pantoprazole  80 mg Oral Daily  . polyethylene glycol  17 g Oral Daily  . sertraline  50 mg Oral QHS  . sodium chloride flush  3 mL Intravenous Q12H  . tacrolimus  2 mg Oral BID  . tamsulosin  0.4 mg Oral QPC supper   Continuous Infusions: . sodium chloride    . heparin 1,150 Units/hr (06/12/20 0207)  . remdesivir 100 mg in NS 100 mL       LOS: 1 day   Time spent= 35 mins    Siera Beyersdorf Arsenio Loader, MD [facility]  If 7PM-7AM, please contact night-coverage  06/12/2020, 7:56 AM

## 2020-06-12 NOTE — Progress Notes (Signed)
New Admission Note:  Arrival Method: By bed around 2230 from ED Mental Orientation: Pt responds to voice but is very drowsy Telemetry: Box 8, CCMD notified Assessment: Completed Skin: Completed, refer to flowsheets IV: L hand and L forearm Pain: Denies Tubes: None Safety Measures: Safety Fall Prevention Plan was given, discussed and signed. Admission: Completed [facility] Orientation: Patient has been orientated to the room, unit and the staff. Family: None  Orders have been reviewed and implemented. Will continue to monitor the patient. Call light has been placed within reach and bed alarm has been activated.   Perry Mount, RN  Phone Number: [phone]

## 2020-06-12 NOTE — Plan of Care (Signed)
  Problem: Education: Goal: Knowledge of General Education information will improve Description: Including pain rating scale, medication(s)/side effects and non-pharmacologic comfort measures Outcome: Progressing   Problem: Coping: Goal: Level of anxiety will decrease Outcome: Progressing   

## 2020-06-12 NOTE — Progress Notes (Signed)
Alveda Reasons, RN called and notified the patient's HD has been moved to 06/13/2020.

## 2020-06-12 NOTE — Progress Notes (Signed)
Pharmacy Antibiotic Note  Paul [name] is a 65 y.o. male admitted on 06/11/2020 with COVID pneumonia now found to have E. Faecalis and Staph. spp bacteremia. ID following to assess source and differentiate between true infection v contamination. Pharmacy has been consulted for vancomycin dosing. Patient with ESRD s/p failed transplant on HD TTS.   Will dose vancomycin with loading dose of 1750mg  (~24 mg/kg) followed by 750mg  TTS after HD. Target pre-HD vancomycin level 15-25 mg/L. Pharmacy will follow.   Plan: Initiate vancomycin 1750mg  x1 followed by 750mg  TTS after HD Pharmacy to follow blood cultures, pre-HD levels as appropriate, further work-up, ID recommendations  Height: 6' (182.9 cm) Weight: 72.9 kg (160 lb 11.5 oz) IBW/kg (Calculated) : 77.6  Temp (24hrs), Avg:99.4 F (37.4 C), Min:98 F (36.7 C), Max:100.6 F (38.1 C)  Recent Labs  Lab 06/11/20 1326 06/12/20 0907  WBC 7.6 4.4  CREATININE 5.78* 6.37*  LATICACIDVEN 2.2*  --     Estimated Creatinine Clearance: 11.8 mL/min (A) (by C-G formula based on SCr of 6.37 mg/dL (H)).    Allergies  Allergen Reactions  . Metformin And Related Other (See Comments)    Has kidney problems    Antimicrobials this admission: Vancomycin 1/24 >>  Microbiology results: 1/23 [facility] [place]  Thank you for allowing pharmacy to be a part of this patient's care.  Alfonse Spruce, PharmD PGY2 ID Pharmacy Resident [phone]  06/12/2020 11:55 AM

## 2020-06-12 NOTE — Evaluation (Signed)
Clinical/Bedside Swallow Evaluation Patient Details  Name: Paul [name] MRN: [id number] Date of Birth: January 31, 1956  Today's Date: 06/12/2020 Time: SLP Start Time (ACUTE ONLY): 1154 SLP Stop Time (ACUTE ONLY): 1210 SLP Time Calculation (min) (ACUTE ONLY): 16 min  Past Medical History:  Past Medical History:  Diagnosis Date  . A-fib (HCC)   . Anemia    esrd  . Calciphylaxis 05/2013   lower ext  . Cancer (HCC)    hx duodenal adenoCa 2002, resected  . Closed left arm fracture 1967  . Corneal abrasion, left   . Depression   . Diabetes mellitus    Type 1 iddm x 11 yrs  . Diabetic neuropathy (HCC)   . ESRD on hemodialysis (HCC)    Pt has ESRD due to DM.  He had a L upper arm AVF prior to starting HD which was ligated due to L arm steal syndrome.  He started HD in Jan 2014 and did home HD.  The family couldn't cannulate the R arm AVF successfully so by mid 2014 they decided to switch to PD which was done late summer 2014.  In Jan 2015 he was admitted with FTT felt to be due to underdialysis on PD and PD was abandoned and he   . GERD (gastroesophageal reflux disease)   . Glomerulosclerosis, diabetic (HCC)   . Heart murmur   . History of blood transfusion   . Hypertension    off of meds due to orthostatic hypotension  . Kidney transplant recipient   . Open wound of both legs with complication    Pt has had progressive wounds of both LE's including gangrene of the toes and patchy necrosis of the calves.  [facility] Jimmey Ralph with hyperbaric O2 5d per week.  He is getting Na Thiosulfate with HD for suspected calciphylaxis. Pt says doctor's aren't sure if these ulcers were diabetic ulcers or calciphylaxis.  He underwent bilat BKA on 07/30/13.  He says that the woun   Past Surgical History:  Past Surgical History:  Procedure Laterality Date  . AMPUTATION Bilateral 07/30/2013   Procedure: AMPUTATION BELOW KNEE;  Surgeon: Nadara Mustard, MD;  Location: [facility] OR;  Service:  Orthopedics;  Laterality: Bilateral;  Bilateral Below Knee Amputations  . AMPUTATION Bilateral 09/03/2013   Procedure: AMPUTATION BELOW KNEE;  Surgeon: Nadara Mustard, MD;  Location: [facility] OR;  Service: Orthopedics;  Laterality: Bilateral;  Bilateral Below Knee Amputation Revision  . AV FISTULA PLACEMENT  06/07/2011   Procedure: ARTERIOVENOUS (AV) FISTULA CREATION;  Surgeon: Chuck Hint, MD;  Location[facility];  Service: Vascular;  Laterality: Left;  . AV FISTULA PLACEMENT  06/18/2012   Procedure: ARTERIOVENOUS (AV) FISTULA CREATION;  Surgeon: Pryor Ochoa, MD;  Location: [facility];  Service: Vascular;  Laterality: Right;  CIMINO  . BILIARY DIVERSION, EXTERNAL  2002  . BONE MARROW BIOPSY  2012  . CAPD INSERTION N/A 01/14/2013   Procedure: LAPAROSCOPIC INSERTION CONTINUOUS AMBULATORY PERITONEAL DIALYSIS  (CAPD) CATHETER;  Surgeon: Ardeth Sportsman, MD;  Location: [facility] OR;  Service: General;  Laterality: N/A;  . CAPD REMOVAL N/A 07/09/2013   Procedure: CONTINUOUS AMBULATORY PERITONEAL DIALYSIS  (CAPD) CATHETER REMOVAL;  Surgeon: Ardeth Sportsman, MD;  Location: [facility] OR;  Service: General;  Laterality: N/A;  . COLONOSCOPY     Hx: of  . INSERTION OF DIALYSIS CATHETER  05/22/2012   Procedure: INSERTION OF DIALYSIS CATHETER;  Surgeon: Pryor Ochoa, MD;  Location:  [facility] OR;  Service: Vascular;  Laterality: N/A;  right internal jugular vein  . IR ANGIO INTRA EXTRACRAN SEL COM CAROTID INNOMINATE BILAT MOD SED  10/28/2018  . IR ANGIO VERTEBRAL SEL SUBCLAVIAN INNOMINATE UNI R MOD SED  10/28/2018  . IR ANGIO VERTEBRAL SEL VERTEBRAL UNI L MOD SED  10/28/2018  . LIGATION OF ARTERIOVENOUS  FISTULA  05/22/2012   Procedure: LIGATION OF ARTERIOVENOUS  FISTULA;  Surgeon: Mal Misty, MD;  Location: [facility];  Service: Vascular;  Laterality: Left;  Left brachio-cephalic fistula  . LOWER EXTREMITY ANGIOGRAM N/A 06/14/2013   Procedure: LOWER EXTREMITY ANGIOGRAM;  Surgeon: Angelia Mould, MD;  Location: [facility] CATH LAB;  Service:  Cardiovascular;  Laterality: N/A;  . NEPHRECTOMY TRANSPLANTED ORGAN    . OTHER SURGICAL HISTORY     Cyst removed from back  . PORTACATH PLACEMENT  2002  . RADIOLOGY WITH ANESTHESIA N/A 10/28/2018   Procedure: IR WITH ANESTHESIA;  Surgeon: Radiologist, Medication, MD;  Location: [facility];  Service: Radiology;  Laterality: N/A;  . REMOVAL OF A DIALYSIS CATHETER    . RENAL BIOPSY, PERCUTANEOUS  2012  . STUMP REVISION Right 01/12/2020   Procedure: REVISION RIGHT BELOW KNEE AMPUTATION;  Surgeon: Newt Minion, MD;  Location: [facility];  Service: Orthopedics;  Laterality: Right;  . TONSILLECTOMY    . VASCULAR SURGERY    . WHIPPLE PROCEDURE  2002   duodenal ca, Dr. Harlow Asa   HPI:  Paul [name] is a 65 y.o. male with medical history significant of of pAF (recurrence 12/2019 in setting of septic shock), PAD, ESRD with failed renal transplant on HD TuThSat,  DM, diastolicCVA who presents to the ED via EMS with a chief complaint of missed dialysis, COVID-positive. Patient reports being diagnosed with Covid 19 x a few days ago. Symptoms have been kept under control at home until he felt weaker today. Reports overall malaise along with shortness of breath. He is on dialysis [facility] but missed yesterday due to "bad weather".   Assessment / Plan / Recommendation Clinical Impression  Pt demonstrates signs of aspiration (coughing) in 2/10 trials with subjective concern for delayed swallow initiation. He tolerated puree well but also had mild oral residue with solids. Pt appeared generally weak and sluggish and his cough was weak at times. He denied any history of dysphagia. Recommend a conservative diet of nectar and dys 2 (finely chopped solids), pills whole in puree. Will f/u for tolerance, advancement or need for MBS if signs of aspiration persist. SLP Visit Diagnosis: Dysphagia, oropharyngeal phase (R13.12)    Aspiration Risk  Moderate aspiration risk    Diet Recommendation Dysphagia 2 (Fine chop);Nectar-thick  liquid   Liquid Administration via: Cup;Straw Medication Administration: Whole meds with puree Supervision: Staff to assist with self feeding Compensations: Slow rate;Small sips/bites Postural Changes: Seated upright at 90 degrees    Other  Recommendations Oral Care Recommendations: Oral care BID Other Recommendations: Order thickener from pharmacy;Have oral suction available   Follow up Recommendations Skilled Nursing facility      Frequency and Duration min 2x/week  2 weeks       Prognosis Prognosis for Safe Diet Advancement: Good      Swallow Study   General HPI: Paul [name] is a 65 y.o. male with medical history significant of of pAF (recurrence 12/2019 in setting of septic shock), PAD, ESRD with failed renal transplant on HD TuThSat,  DM, diastolicCVA who presents to the ED via EMS with a chief complaint of  missed dialysis, COVID-positive. Patient reports being diagnosed with Covid 19 x a few days ago. Symptoms have been kept under control at home until he felt weaker today. Reports overall malaise along with shortness of breath. He is on dialysis [facility] but missed yesterday due to "bad weather". Type of Study: Bedside Swallow Evaluation Previous Swallow Assessment: none Diet Prior to this Study: NPO Temperature Spikes Noted: No Respiratory Status: Nasal cannula History of Recent Intubation: No Behavior/Cognition: Alert;Cooperative Oral Cavity Assessment: Dry Oral Care Completed by SLP: No Oral Cavity - Dentition: Adequate natural dentition Vision: Functional for self-feeding Self-Feeding Abilities: Able to feed self;Needs assist Patient Positioning: Upright in bed Baseline Vocal Quality: Low vocal intensity Volitional Cough: Weak;Congested Volitional Swallow: Able to elicit    Oral/Motor/Sensory Function Overall Oral Motor/Sensory Function: Mild impairment Facial ROM: Within Functional Limits Facial Symmetry: Within Functional Limits Facial Strength: Within Functional  Limits Lingual ROM: Reduced left;Suspected CN XII (hypoglossal) dysfunction Lingual Symmetry: Abnormal symmetry left;Suspected CN XII (hypoglossal) dysfunction Lingual Strength: Reduced   Ice Chips Ice chips: Not tested   Thin Liquid Thin Liquid: Impaired Presentation: Cup Oral Phase Impairments: Reduced labial seal Oral Phase Functional Implications: Right anterior spillage;Left anterior spillage Pharyngeal  Phase Impairments: Suspected delayed Swallow;Cough - Immediate    Nectar Thick Nectar Thick Liquid: Not tested   Honey Thick Honey Thick Liquid: Not tested   Puree Puree: Within functional limits Presentation: Spoon   Solid     Solid: Impaired Presentation: Self Fed Oral Phase Functional Implications: Oral residue      Paul [name], Katherene Ponto 06/12/2020,1:51 PM

## 2020-06-12 NOTE — Progress Notes (Signed)
ANTICOAGULATION CONSULT NOTE - Foll ow Up Consult  Pharmacy Consult for IV Heparin Indication: atrial fibrillation  Allergies  Allergen Reactions  . Metformin And Related Other (See Comments)    Has kidney problems    Patient Measurements: Height: 6' (182.9 cm) Weight: 72.9 kg (160 lb 11.5 oz) IBW/kg (Calculated) : 77.6 Heparin Dosing Weight: 72.9 kg  Vital Signs: Temp: 97.6 F (36.4 C) (01/24 2058) BP: 128/80 (01/24 2058) Pulse Rate: 59 (01/24 2058)  Labs: Recent Labs    06/11/20 1326 06/12/20 0900 06/12/20 0907 06/12/20 2000  HGB 12.4*  --  11.2*  --   HCT 43.1  --  36.6*  --   PLT 101*  --  85*  --   APTT  --  45*  --  49*  HEPARINUNFRC  --  <0.10*  --   --   CREATININE 5.78*  --  6.37*  --     Estimated Creatinine Clearance: 11.8 mL/min (A) (by C-G formula based on SCr of 6.37 mg/dL (H)).   Assessment: 65 yr old man to transition from apixaban to heparin for afib while NPO. Pt was on apixaban PTA; last dose 06/06/20 at 0800, per med rec. Pt with ESRD, on TTS HD schedule.   aPTT ~8 hrs after increasing heparin infusion to 1300 units/hr was 49 sec, which is below the goal range for this pt. H/H 11.2/36.6, plt 85 (plt trending down). Per RN, no issues with IV or bleeding observed.  Goal of Therapy:  Heparin level 0.3-0.7 units/ml aPTT 66-102 seconds Monitor platelets by anticoagulation protocol: Yes   Plan:  Increase heparin infusion to 1500 units/hr Check aPTT, heparin level in 8 hrs Monitor daily aPTT, heparin level, CBC (monitor platelets closely) Monitor for bleeding F/U transition back to apixaban when mentation improves  Gillermina Hu, PharmD, BCPS, [facility] Clinical Pharmacist 06/12/2020,9:46 PM

## 2020-06-12 NOTE — Progress Notes (Signed)
   06/11/20 2245  Assess: MEWS Score  Temp 99 F (37.2 C)  BP 135/89  Pulse Rate (!) 116  ECG Heart Rate (!) 115  Resp 16  SpO2 97 %  O2 Device Room Air  Assess: MEWS Score  MEWS Temp 0  MEWS Systolic 0  MEWS Pulse 2  MEWS RR 0  MEWS LOC 0  MEWS Score 2  MEWS Score Color Yellow  Assess: if the MEWS score is Yellow or Red  Were vital signs taken at a resting state? Yes  Focused Assessment No change from prior assessment  Early Detection of Sepsis Score *See Row Information* Low  MEWS guidelines implemented *See Row Information* Yes  Treat  MEWS Interventions Administered scheduled meds/treatments  Pain Scale 0-10  Pain Score 0  Take Vital Signs  Increase Vital Sign Frequency  Yellow: Q 2hr X 2 then Q 4hr X 2, if remains yellow, continue Q 4hrs  Escalate  MEWS: Escalate Yellow: discuss with charge nurse/RN and consider discussing with provider and RRT  Notify: Charge Nurse/RN  Name of Charge Nurse/RN Notified Jentri Aye (Self)  Date Charge Nurse/RN Notified 06/11/20  Time Charge Nurse/RN Notified 2245  Notify: Provider  Provider Name/Title Earlean Polka MD  Date Provider Notified 06/11/20  Time Provider Notified 2255  Notification Type Page  Notification Reason Other (Comment) (pt was already yellow, informed MD of how drowsy pt is.)  Response See new orders  Date of Provider Response 06/11/20  Time of Provider Response 2300  Document  Patient Outcome Stabilized after interventions  Progress note created (see row info) Yes

## 2020-06-12 NOTE — Progress Notes (Signed)
[place] for heparin Indication: atrial fibrillation  Allergies  Allergen Reactions  . Metformin And Related Other (See Comments)    Has kidney problems    Patient Measurements: Height: 6' (182.9 cm) Weight: 72.9 kg (160 lb 11.5 oz) IBW/kg (Calculated) : 77.6 Heparin Dosing Weight: 72.9 kg  Vital Signs: Temp: 98 F (36.7 C) (01/24 1037) Temp Source: Axillary (01/24 0517) BP: 143/82 (01/24 1037) Pulse Rate: 56 (01/24 1037)  Labs: Recent Labs    06/11/20 1326 06/12/20 0900 06/12/20 0907  HGB 12.4*  --  11.2*  HCT 43.1  --  36.6*  PLT 101*  --  85*  APTT  --  45*  --   HEPARINUNFRC  --  <0.10*  --   CREATININE 5.78*  --  6.37*    Estimated Creatinine Clearance: 11.8 mL/min (A) (by C-G formula based on SCr of 6.37 mg/dL (H)).   Assessment: 65 yo man to transition from eilquis to heparin for afib while NPO.  PTT 45 seconds this AM  Goal of Therapy:  Heparin level 0.3-0.7 units/ml aPTT 66-102 seconds Monitor platelets by anticoagulation protocol: Yes   Plan:  Increase heparin to 1300 units / hr PTT in 8 hours  Thank you Anette Guarneri, PharmD 06/12/2020,10:54 AM

## 2020-06-13 ENCOUNTER — Inpatient Hospital Stay (HOSPITAL_COMMUNITY): Payer: Medicare Other

## 2020-06-13 ENCOUNTER — Other Ambulatory Visit (HOSPITAL_COMMUNITY): Payer: Medicare Other

## 2020-06-13 DIAGNOSIS — I5031 Acute diastolic (congestive) heart failure: Secondary | ICD-10-CM

## 2020-06-13 DIAGNOSIS — R7881 Bacteremia: Secondary | ICD-10-CM | POA: Diagnosis not present

## 2020-06-13 DIAGNOSIS — B952 Enterococcus as the cause of diseases classified elsewhere: Secondary | ICD-10-CM | POA: Diagnosis not present

## 2020-06-13 DIAGNOSIS — N186 End stage renal disease: Secondary | ICD-10-CM | POA: Diagnosis not present

## 2020-06-13 DIAGNOSIS — U071 COVID-19: Secondary | ICD-10-CM | POA: Diagnosis not present

## 2020-06-13 LAB — CBC WITH DIFFERENTIAL/PLATELET
Abs Immature Granulocytes: 0.03 10*3/uL (ref 0.00–0.07)
Basophils Absolute: 0 10*3/uL (ref 0.0–0.1)
Basophils Relative: 0 %
Eosinophils Absolute: 0 10*3/uL (ref 0.0–0.5)
Eosinophils Relative: 0 %
HCT: 36.2 % — ABNORMAL LOW (ref 39.0–52.0)
Hemoglobin: 10.6 g/dL — ABNORMAL LOW (ref 13.0–17.0)
Immature Granulocytes: 0 %
Lymphocytes Relative: 15 %
Lymphs Abs: 1.1 10*3/uL (ref 0.7–4.0)
MCH: 26.6 pg (ref 26.0–34.0)
MCHC: 29.3 g/dL — ABNORMAL LOW (ref 30.0–36.0)
MCV: 91 fL (ref 80.0–100.0)
Monocytes Absolute: 0.2 10*3/uL (ref 0.1–1.0)
Monocytes Relative: 3 %
Neutro Abs: 6.1 10*3/uL (ref 1.7–7.7)
Neutrophils Relative %: 82 %
Platelets: 106 10*3/uL — ABNORMAL LOW (ref 150–400)
RBC: 3.98 MIL/uL — ABNORMAL LOW (ref 4.22–5.81)
RDW: 17.6 % — ABNORMAL HIGH (ref 11.5–15.5)
WBC: 7.5 10*3/uL (ref 4.0–10.5)
nRBC: 0 % (ref 0.0–0.2)

## 2020-06-13 LAB — COMPREHENSIVE METABOLIC PANEL
ALT: 8 U/L (ref 0–44)
AST: 15 U/L (ref 15–41)
Albumin: 2.2 g/dL — ABNORMAL LOW (ref 3.5–5.0)
Alkaline Phosphatase: 71 U/L (ref 38–126)
Anion gap: 19 — ABNORMAL HIGH (ref 5–15)
BUN: 66 mg/dL — ABNORMAL HIGH (ref 8–23)
CO2: 20 mmol/L — ABNORMAL LOW (ref 22–32)
Calcium: 7.6 mg/dL — ABNORMAL LOW (ref 8.9–10.3)
Chloride: 98 mmol/L (ref 98–111)
Creatinine, Ser: 6.91 mg/dL — ABNORMAL HIGH (ref 0.61–1.24)
GFR, Estimated: 8 mL/min — ABNORMAL LOW (ref >60)
Glucose, Bld: 158 mg/dL — ABNORMAL HIGH (ref 70–99)
Potassium: 3.8 mmol/L (ref 3.5–5.1)
Sodium: 137 mmol/L (ref 135–145)
Total Bilirubin: 0.8 mg/dL (ref 0.3–1.2)
Total Protein: 5.3 g/dL — ABNORMAL LOW (ref 6.5–8.1)

## 2020-06-13 LAB — MAGNESIUM: Magnesium: 2.2 mg/dL (ref 1.7–2.4)

## 2020-06-13 LAB — FOLATE RBC
Folate, Hemolysate: 593 ng/mL
Folate, RBC: 1680 ng/mL (ref >498)
Hematocrit: 35.3 % — ABNORMAL LOW (ref 37.5–51.0)

## 2020-06-13 LAB — GLUCOSE, CAPILLARY
Glucose-Capillary: 200 mg/dL — ABNORMAL HIGH (ref 70–99)
Glucose-Capillary: 196 mg/dL — ABNORMAL HIGH (ref 70–99)
Glucose-Capillary: 140 mg/dL — ABNORMAL HIGH (ref 70–99)

## 2020-06-13 LAB — D-DIMER, QUANTITATIVE: D-Dimer, Quant: 2.87 ug/mL-FEU — ABNORMAL HIGH (ref 0.00–0.50)

## 2020-06-13 LAB — ECHOCARDIOGRAM LIMITED
Area-P 1/2: 2.24 cm2
Height: 72 in
S' Lateral: 4.5 cm
Weight: 2571.45 oz

## 2020-06-13 LAB — HEPARIN LEVEL (UNFRACTIONATED): Heparin Unfractionated: 0.15 IU/mL — ABNORMAL LOW (ref 0.30–0.70)

## 2020-06-13 LAB — APTT: aPTT: 73 seconds — ABNORMAL HIGH (ref 24–36)

## 2020-06-13 LAB — FERRITIN: Ferritin: 861 ng/mL — ABNORMAL HIGH (ref 24–336)

## 2020-06-13 LAB — C-REACTIVE PROTEIN: CRP: 10.9 mg/dL — ABNORMAL HIGH (ref <1.0)

## 2020-06-13 MED ORDER — INSULIN GLARGINE 100 UNIT/ML ~~LOC~~ SOLN
10.0000 [IU] | Freq: Every day | SUBCUTANEOUS | Status: DC
  Administered 2020-06-14 – 2020-06-20 (×7): 10 [IU] via SUBCUTANEOUS
  Filled 2020-06-13 (×7): qty 0.1

## 2020-06-13 NOTE — Evaluation (Signed)
Occupational Therapy Evaluation Patient Details Name: Paul [name] MRN: [id number] DOB: 01/25/1956 Today's Date: 06/13/2020    History of Present Illness This 65yo male was referred to PT on 04/27/2019 by [name], DO with weakness & gait disorder.  He had PT following bil. TTAs & TIA and successfully became ambulatory at community level. His kidney transplant is failing and is back on transplant list. He was hospitalized 4 times in 2020 with medical issues. His current prostheses delivered late 2020.   HTN, DM2, renal transplant, CKD st IV, calciphylaxis, A-Fib, bil. TTAs, TIA   Clinical Impression   PTA, pt lives with spouse and reports typically Modified Independent with ADLs and mobility using RW and prosthetic LEs. Pt presents now with deficits in endurance, strength, and dynamic balance. Pt noted with bowel incontinence on entry, Total A for cleanup. Pt does not have prosthetic LEs in room, so guided pt in AP transfer at Mod A x 2 to recliner chair with noted increasing fatigue by end of transfer. Pt overall Min A for UB ADLs and up to Total A for LB ADLs at this time. If pt's wife able to bring prosthetic LEs to trial with pt, can better assess and make finalized recommendations. Based on today's presentation, recommend DC to SNF for short term rehab. Will continue to assess and update recommendations as appropriate.   Pt on 2 L O2 on entry. Pt not currently connected to O2 monitoring but endorses SOB during transfer. Breathing normalized after cues for implementation of pursed lip breathing.    Follow Up Recommendations  SNF;Supervision/Assistance - 24 hour (TBD - pending progress and if B prosthetic LEs able to be brought to pt room for mobility assessment)    Equipment Recommendations  None recommended by OT (appears well equipped)    Recommendations for Other Services       Precautions / Restrictions Precautions Precautions: Fall;Other (comment) Precaution Comments: B BKA  (does not have prosthetics in room at time of eval) Restrictions Weight Bearing Restrictions: No      Mobility Bed Mobility Overal bed mobility: Needs Assistance Bed Mobility: Rolling;Supine to Sit Rolling: Min assist   Supine to sit: Max assist     General bed mobility comments: Heavy assist for trunk to sit EOB (Mod A x 2 or Max A x 1) from flat bed    Transfers Overall transfer level: Needs assistance Equipment used: None Transfers: Comptroller transfers: Mod assist;+2 physical assistance;+2 safety/equipment   General transfer comment: Pt able to demo scooting with increased time and use of B UE to scoot backwards to EOB, then due to fatigue, pt required assistance Mod A x 2 to scoot over bed edge and into chair with use of pad    Balance Overall balance assessment: Needs assistance Sitting-balance support: Bilateral upper extremity supported;Single extremity supported Sitting balance-Leahy Scale: Fair                                     ADL either performed or assessed with clinical judgement   ADL Overall ADL's : Needs assistance/impaired Eating/Feeding: Set up;Sitting Eating/Feeding Details (indicate cue type and reason): Noted coughing throughout Grooming: Set up;Sitting;Wash/dry face   Upper Body Bathing: Set up;Sitting   Lower Body Bathing: Maximal assistance;Sitting/lateral leans   Upper Body Dressing : Minimal assistance;Sitting Upper Body Dressing Details (indicate cue type and reason): Min A  for donning clean gown Lower Body Dressing: Maximal assistance;Sitting/lateral leans   Toilet Transfer: Moderate assistance;+2 for physical assistance;+2 for safety/equipment;Anterior/posterior Toilet Transfer Details (indicate cue type and reason): simulated to recliner Toileting- Clothing Manipulation and Hygiene: Total assistance;Sitting/lateral lean Toileting - Clothing Manipulation Details (indicate cue  type and reason): Bowel incontinence in bed, Total A for cleanup       General ADL Comments: Pt with bowel incontinence on entry (reports this happens at home, wears depends). Pt typically wears prosthetics for mobility at home, but not in pt room so session performed via scooting/lateral leans and bed level     Vision Patient Visual Report: No change from baseline Vision Assessment?: No apparent visual deficits     Perception     Praxis      Pertinent Vitals/Pain Pain Assessment: Faces Faces Pain Scale: Hurts little more Pain Location: Buttocks Pain Descriptors / Indicators: Grimacing;Sore Pain Intervention(s): Repositioned;Other (comment) (notified RN of redness)     Hand Dominance Right   Extremity/Trunk Assessment Upper Extremity Assessment Upper Extremity Assessment: Generalized weakness   Lower Extremity Assessment Lower Extremity Assessment: Defer to PT evaluation   Cervical / Trunk Assessment Cervical / Trunk Assessment: Normal   Communication Communication Communication: No difficulties   Cognition Arousal/Alertness: Awake/alert Behavior During Therapy: WFL for tasks assessed/performed Overall Cognitive Status: Within Functional Limits for tasks assessed                                     General Comments  Pt on 2 L O2, no O2 monitoring in room. Pt endorses SOB but unable to assess reading due to no monitor in room. Cued pt for pursed lip breathing. Pt reports prosthetic LEs at home, wife may be able to bring them but wife also showing COVID symptoms. Provided IS with pt demo ability to pull 750 mL    Exercises     Shoulder Instructions      Home Living Family/patient expects to be discharged to:: Private residence Living Arrangements: Spouse/significant other   Type of Home: House Home Access: Ramped entrance (ramp through garage)     [place]: Two level;Able to live on main level with bedroom/bathroom     Bathroom Shower/Tub:  Occupational psychologist: Standard Bathroom Accessibility: Yes   Home Equipment: Cane - single point;Shower seat;Wheelchair - manual;Crutches;Walker - 2 wheels;Hand held shower head;Grab bars - toilet;Shower seat - built in          Prior Functioning/Environment Level of Independence: Independent with assistive device(s)        Comments: Uses RW more recently and SPC prior to a few weeks ago with B prosthetic LEs. Modified Independent in ADLs, shares IADLs in the home (wife may complete majority of the time)        OT Problem List: Decreased strength;Decreased activity tolerance;Impaired balance (sitting and/or standing);Cardiopulmonary status limiting activity      OT Treatment/Interventions: Self-care/ADL training;Therapeutic exercise;Energy conservation;DME and/or AE instruction;Therapeutic activities;Patient/family education;Balance training    OT Goals(Current goals can be found in the care plan section) Acute Rehab OT Goals Patient Stated Goal: be able to go home from hospital OT Goal Formulation: With patient Time For Goal Achievement: 06/26/20 Potential to Achieve Goals: Good ADL Goals Pt Will Perform Grooming: with modified independence;sitting Pt Will Perform Lower Body Bathing: with supervision;sitting/lateral leans Pt Will Perform Lower Body Dressing: with supervision;sitting/lateral leans Pt Will Transfer to Toilet:  with min assist;anterior/posterior transfer;bedside commode Pt/caregiver will Perform Home Exercise Program: Increased strength;Both right and left upper extremity;With theraband;Independently;With written HEP provided Additional ADL Goal #1: Pt to demonstrate ability to monitor O2 and independently implement pursed lip breathing to maintain O2 > 88% Additional ADL Goal #2: Pt to demonstrate implementation of at least 2 energy conservation strategies during ADLs  OT Frequency: Min 2X/week   Barriers to D/C:            Co-evaluation  PT/OT/SLP Co-Evaluation/Treatment: Yes Reason for Co-Treatment: Complexity of the patient's impairments (multi-system involvement);To address functional/ADL transfers;For patient/therapist safety   OT goals addressed during session: ADL's and self-care;Strengthening/ROM      AM-PAC OT "6 Clicks" Daily Activity     Outcome Measure Help from another person eating meals?: A Little Help from another person taking care of personal grooming?: A Little Help from another person toileting, which includes using toliet, bedpan, or urinal?: Total Help from another person bathing (including washing, rinsing, drying)?: A Lot Help from another person to put on and taking off regular upper body clothing?: A Little Help from another person to put on and taking off regular lower body clothing?: A Lot 6 Click Score: 14   End of Session Equipment Utilized During Treatment: Oxygen Nurse Communication: Mobility status;Other (comment) (AP transfer, redness on bottom, no O2 monitoring in room)  Activity Tolerance: Patient limited by fatigue Patient left: in chair;with call bell/phone within reach;with chair alarm set  OT Visit Diagnosis: Other abnormalities of gait and mobility (R26.89);Muscle weakness (generalized) (M62.81)                Time: 9741-6384 OT Time Calculation (min): 47 min Charges:  OT General Charges $OT Visit: 1 Visit OT Evaluation $OT Eval Moderate Complexity: 1 Mod OT Treatments $Self Care/Home Management : 8-22 mins  Layla Maw, OTR/L  Layla Maw 06/13/2020, 10:10 AM

## 2020-06-13 NOTE — Progress Notes (Signed)
[facility] Progress Note  Subjective: bcx's + enterococcus, ID is evaluating  Patient not examined today directly given COVID-19 + status, utilizing data taken from chart +/- discussions w/ providers and staff.      Background on admission: Paul [name] is a 65 y.o. 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 Vitals:   06/13/20 0415 06/13/20 0523 06/13/20 1008 06/13/20 1056  BP: (!) 143/76 (!) 145/81  132/69  Pulse: (!) 56 (!) 54 61 (!) 52  Resp: (!) 22 16  18   Temp:  97.9 F (36.6 C)  (!) 96.7 F (35.9 C)  TempSrc:  Oral  Oral  SpO2: 98% 92%  100%  Weight:      Height:        Exam:  Patient not examined today directly given COVID-19 + status, utilizing data taken from chart +/- discussions w/ providers and staff.          OP HD: TTS [facility]  3.5h   66kh  3K/2.25 bath  RFA AVF  Hep none  - hect 2 ug  - mircera 200 q2 , last 1/13   CXR 1/23 - IMPRESSION: Diffuse interstitial prominence with streaky interstitial opacities within the mid to lower lung fields and small left pleural effusion. Findings may reflect multifocal atypical/viral infection versus pulmonary edema.  Assessment/ Plan: 1.  COVID-19 pneumonia: With O2 requirement. Per admitting team. 2.  Tachycardia/A-fib RVR: S/p Diltiazem. Per primary team. 3.  Hypoxemia: on 2 L [place], possibly some edema by CXR in addition to patchy infiltrates 4.  ESRD: Usual TTS schedule - missed HD Sat 1/22. HD yest off sched 1/24. HD today to get back on schedule.  5.  Hypertension/volume: BP high, up 6kg by wt's.  Max UF  6.  Anemia: Hgb > 12, no ESA for now. 7.  Metabolic bone disease: CorrCa ok, Phos pending. Resume home binders (Renvela). 8.  T1DM: Insulin per primary   Rob Varick Keys 06/13/2020, 4:15 PM   Recent Labs  Lab 06/12/20 0907  06/13/20 0500  K 3.9 3.8  BUN 49* 66*  CREATININE 6.37* 6.91*  CALCIUM 7.9* 7.6*  HGB 11.2* 10.6*   Inpatient medications: . amLODipine  10 mg Oral Daily  . aspirin  81 mg Oral Daily  . atorvastatin  10 mg Oral Q M,W,F  . bisacodyl  10 mg Oral Daily  . [START ON 06/15/2020] Darbepoetin Alfa  60 mcg Intravenous Q Thu-HD  . docusate sodium  100 mg Oral BID  . insulin aspart  0-20 Units Subcutaneous TID WC  . [START ON 06/14/2020] insulin glargine  10 Units Subcutaneous Daily  . Ipratropium-Albuterol  1 puff Inhalation Q6H  . lipase/protease/amylase  12,000 Units Oral TID AC  . magnesium chloride  2 tablet Oral BID  . methylPREDNISolone (SOLU-MEDROL) injection  1 mg/kg Intravenous Q12H   Followed by  . [START ON 06/14/2020] predniSONE  50 mg Oral Daily  . metoprolol tartrate  25 mg Oral BID  . pantoprazole  80 mg Oral Daily  . polyethylene glycol  17 g Oral Daily  . sertraline  50 mg Oral QHS  . sodium chloride flush  3 mL Intravenous Q12H  . tacrolimus  2 mg Oral BID  . tamsulosin  0.4 mg Oral QPC supper   . sodium chloride    . heparin 1,600  Units/hr (06/13/20 1030)  . remdesivir 100 mg in NS 100 mL 100 mg (06/13/20 1008)  . vancomycin 750 mg (06/13/20 1118)   sodium chloride, acetaminophen, chlorpheniramine-HYDROcodone, guaiFENesin-dextromethorphan, metoprolol tartrate, Resource ThickenUp Clear, sodium chloride flush, traMADol

## 2020-06-13 NOTE — Progress Notes (Signed)
[place] for Infectious Disease    Date of Admission:  06/11/2020   Total days of antibiotics 3           ID: Paul [name] is a 65 y.o. male with covid pneumonia plus enterococcal bacteremia Principal Problem:   Bacteremia due to Enterococcus Active Problems:   ESRD on hemodialysis (HCC)   DM (diabetes mellitus) ([place])   GERD (gastroesophageal reflux disease)   PAF (paroxysmal atrial fibrillation) (HCC)   Pneumonia due to COVID-19 virus   Acute heart failure with preserved ejection fraction (HFpEF) (HCC)    Subjective: He reports loose stools. Some shortness of breath and cough but overall improved. Tolerated HD last night wihtout difficulty  ROS: denies abdominal cramping, or blood in stool Medications:  . amLODipine  10 mg Oral Daily  . aspirin  81 mg Oral Daily  . atorvastatin  10 mg Oral Q M,W,F  . bisacodyl  10 mg Oral Daily  . [START ON 06/15/2020] Darbepoetin Alfa  60 mcg Intravenous Q Thu-HD  . docusate sodium  100 mg Oral BID  . insulin aspart  0-20 Units Subcutaneous TID WC  . [START ON 06/14/2020] insulin glargine  10 Units Subcutaneous Daily  . Ipratropium-Albuterol  1 puff Inhalation Q6H  . lipase/protease/amylase  12,000 Units Oral TID AC  . magnesium chloride  2 tablet Oral BID  . methylPREDNISolone (SOLU-MEDROL) injection  1 mg/kg Intravenous Q12H   Followed by  . [START ON 06/14/2020] predniSONE  50 mg Oral Daily  . metoprolol tartrate  25 mg Oral BID  . pantoprazole  80 mg Oral Daily  . polyethylene glycol  17 g Oral Daily  . sertraline  50 mg Oral QHS  . sodium chloride flush  3 mL Intravenous Q12H  . tacrolimus  2 mg Oral BID  . tamsulosin  0.4 mg Oral QPC supper    Objective: Vital signs in last 24 hours: Temp:  [96.7 F (35.9 C)-98.8 F (37.1 C)] 96.7 F (35.9 C) (01/25 1056) Pulse Rate:  [48-61] 52 (01/25 1056) Resp:  [16-28] 18 (01/25 1056) BP: (128-152)/(69-90) 132/69 (01/25 1056) SpO2:  [92 %-100 %] 100 % (01/25 1056) Physical  Exam  Constitutional: He is oriented to person, place, and time. He appears well-developed and well-nourished. No distress.  HENT:  Mouth/Throat: Oropharynx is clear and moist. No oropharyngeal exudate.  Cardiovascular: Normal rate, regular rhythm and normal heart sounds. Exam reveals no gallop and no friction rub.  No murmur heard.  Pulmonary/Chest: Effort normal and breath sounds normal. No respiratory distress. He has no wheezes.  Abdominal: Soft. Bowel sounds are normal. He exhibits no distension. There is no tenderness.  Lymphadenopathy:  He has no cervical adenopathy.  Neurological: He is alert and oriented to person, place, and time.  Skin: Skin is warm and dry. No rash noted. No erythema.  Psychiatric: He has a normal mood and affect. His behavior is normal.     Lab Results Recent Labs    06/12/20 0907 06/12/20 1026 06/13/20 0500  WBC 4.4  --  7.5  HGB 11.2*  --  10.6*  HCT 36.6* 35.3* 36.2*  NA 140  --  137  K 3.9  --  3.8  CL 103  --  98  CO2 22  --  20*  BUN 49*  --  66*  CREATININE 6.37*  --  6.91*   Liver Panel Recent Labs    06/12/20 0907 06/13/20 0500  PROT 5.5* 5.3*  ALBUMIN  2.2* 2.2*  AST 17 15  ALT 11 8  ALKPHOS 70 71  BILITOT 1.0 0.8   Sedimentation Rate No results for input(s): ESRSEDRATE in the last 72 hours. C-Reactive Protein Recent Labs    06/12/20 0907 06/13/20 0803  CRP 16.7* 10.9*    Microbiology: 1/25 Repeat blood cx pending 1/23 enterococcus and gpr pending Studies/Results: CT HEAD WO CONTRAST  Result Date: 06/12/2020 CLINICAL DATA:  Altered mental status EXAM: CT HEAD WITHOUT CONTRAST TECHNIQUE: Contiguous axial images were obtained from the base of the skull through the vertex without intravenous contrast. COMPARISON:  MRI 10/29/2018, CT FINDINGS: Brain: Mild parenchymal volume loss is present, commensurate with the patient's age. Mild periventricular white matter changes are present likely reflecting the sequela of small  vessel ischemia, stable since prior examination. Tiny remote lacunar infarcts developed within the left cerebellar hemisphere, new since prior examination. No acute intracranial hemorrhage or infarct. No abnormal mass effect or midline shift. No abnormal intra or extra-axial mass lesion or fluid collection. Ventricular size is normal. Vascular: No asymmetric hyperdense vasculature at the skull base. Moderate atherosclerotic calcification within the carotid siphons and distal vertebral arteries. Skull: Intact Sinuses/Orbits: There is complete opacification of the left maxillary sinus, left ethmoid air cells, and left frontal sinus. Moderate mucosal thickening within the right sphenoid sinus. The orbits are unremarkable. Other: The mastoid air cells and middle ear cavities are clear. IMPRESSION: No evidence of acute intracranial hemorrhage or infarct. Interval development of tiny remote lacunar infarct within the left cerebellar hemisphere. Mild senescent change, stable since prior examination. Extensive left-sided paranasal sinus disease. Electronically Signed   By: Fidela Salisbury MD   On: 06/12/2020 01:40     Assessment/Plan: 65yo M with mild-mod covid-19 pneumonia also found to have enterococcal bacteremia - continue on vancomycin with HD - plan for TTE today to evaluate for endocarditis - repeat blood cx from today are pending - source of enterococcus unclear, possibly translocation from bowel - GPR also growing on culture. Awaiting to see further micro identification  [facility] for Infectious Diseases Cell: [phone] Pager: [phone]  06/13/2020, 3:15 PM

## 2020-06-13 NOTE — Progress Notes (Signed)
PHARMACY - PHYSICIAN COMMUNICATION CRITICAL VALUE ALERT - BLOOD CULTURE IDENTIFICATION (BCID)  Paul [name] is an 66 y.o. male who presented to [facility] on 06/11/2020 with a chief complaint of COVID. Patient currently on vancomycin for possible Enterococcal faecalis/Staph bacteremia (grown in 1/4 bottles on 1/23) - pending further speciation and sensitivities. ID is following and will obtain TTE to assess for any possible vegetations. Now 1 bottle in the other blood culture set from 1/23 is growing gram positive rods. This likely represents contamination and no further antimicrobial escalation is required.  Assessment:  1/4 BCx bottles + GPRs - likely contamination  Name of physician (or Provider) Contacted: Dr. Baxter Flattery  Current antibiotics: Vancomycin  Changes to prescribed antibiotics recommended:  Continue vancomycin  Results for orders placed or performed during the hospital encounter of 06/11/20  Blood Culture ID Panel (Reflexed) (Collected: 06/11/2020  1:26 PM)  Result Value Ref Range   Enterococcus faecalis DETECTED (A) NOT DETECTED   Enterococcus Faecium NOT DETECTED NOT DETECTED   Listeria monocytogenes NOT DETECTED NOT DETECTED   Staphylococcus species DETECTED (A) NOT DETECTED   Staphylococcus aureus (BCID) NOT DETECTED NOT DETECTED   Staphylococcus epidermidis NOT DETECTED NOT DETECTED   Staphylococcus lugdunensis NOT DETECTED NOT DETECTED   Streptococcus species NOT DETECTED NOT DETECTED   Streptococcus agalactiae NOT DETECTED NOT DETECTED   Streptococcus pneumoniae NOT DETECTED NOT DETECTED   Streptococcus pyogenes NOT DETECTED NOT DETECTED   A.calcoaceticus-baumannii NOT DETECTED NOT DETECTED   Bacteroides fragilis NOT DETECTED NOT DETECTED   Enterobacterales NOT DETECTED NOT DETECTED   Enterobacter cloacae complex NOT DETECTED NOT DETECTED   Escherichia coli NOT DETECTED NOT DETECTED   Klebsiella aerogenes NOT DETECTED NOT DETECTED   Klebsiella oxytoca NOT DETECTED  NOT DETECTED   Klebsiella pneumoniae NOT DETECTED NOT DETECTED   Proteus species NOT DETECTED NOT DETECTED   Salmonella species NOT DETECTED NOT DETECTED   Serratia marcescens NOT DETECTED NOT DETECTED   Haemophilus influenzae NOT DETECTED NOT DETECTED   Neisseria meningitidis NOT DETECTED NOT DETECTED   Pseudomonas aeruginosa NOT DETECTED NOT DETECTED   Stenotrophomonas maltophilia NOT DETECTED NOT DETECTED   Candida albicans NOT DETECTED NOT DETECTED   Candida auris NOT DETECTED NOT DETECTED   Candida glabrata NOT DETECTED NOT DETECTED   Candida krusei NOT DETECTED NOT DETECTED   Candida parapsilosis NOT DETECTED NOT DETECTED   Candida tropicalis NOT DETECTED NOT DETECTED   Cryptococcus neoformans/gattii NOT DETECTED NOT DETECTED   Vancomycin resistance NOT DETECTED NOT DETECTED    Paul [name] 06/13/2020  8:10 AM

## 2020-06-13 NOTE — Progress Notes (Signed)
  Speech Language Pathology Treatment: Dysphagia  Patient Details Name: Paul [name] MRN: [id number] DOB: 1956/03/22 Today's Date: 06/13/2020 Time: 0940-7680 SLP Time Calculation (min) (ACUTE ONLY): 18 min  Assessment / Plan / Recommendation Clinical Impression  Pt much more alert today, able to sit up and self feed dys 2 breakfast tray while chit chatting. However he continues to cough after thin liquids despite appearing much stronger and aware of PO. He did not have any other baseline coughing before or well after sips, making it more concerning. Recommend MBS at this point for instrumental assessment of swallowing. Will plan to address this today or tomorrow.   HPI HPI: Paul [name] is a 65 y.o. male with medical history significant of of pAF (recurrence 12/2019 in setting of septic shock), PAD, ESRD with failed renal transplant on HD TuThSat,  DM, diastolicCVA who presents to the ED via EMS with a chief complaint of missed dialysis, COVID-positive. Patient reports being diagnosed with Covid 19 x a few days ago. Symptoms have been kept under control at home until he felt weaker today. Reports overall malaise along with shortness of breath. He is on dialysis [facility] but missed yesterday due to "bad weather".      SLP Plan  MBS       Recommendations  Diet recommendations: Dysphagia 2 (fine chop);Nectar-thick liquid Liquids provided via: Cup;Straw Medication Administration: Whole meds with puree Supervision: Patient able to self feed Compensations: Slow rate;Small sips/bites                Oral Care Recommendations: Oral care BID Follow up Recommendations: Skilled Nursing facility SLP Visit Diagnosis: Dysphagia, oropharyngeal phase (R13.12) Plan: MBS       GO                Mayo Faulk, Katherene Ponto 06/13/2020, 11:12 AM

## 2020-06-13 NOTE — Evaluation (Signed)
Physical Therapy Evaluation Patient Details Name: Paul [name] MRN: [id number] DOB: Sep 20, 1955 Today's Date: 06/13/2020   History of Present Illness  This 65yo male was referred to PT on 04/27/2019 by [name], DO with weakness & gait disorder.  He had PT following bil. TTAs & TIA and successfully became ambulatory at community level. His kidney transplant is failing and is back on transplant list. He was hospitalized 4 times in 2020 with medical issues. His current prostheses delivered late 2020.   HTN, DM2, renal transplant, CKD st IV, calciphylaxis, A-Fib, bil. TTAs, TIA  Clinical Impression   Patient received in bed with OT, pleasant and cooperative. Needed heavy levels of physical assist to get to sitting at EOB and for AP transfer up into recliner. Intermittently became SOB, no O2 monitor in the room but we did encourage PLB and used supplemental oxygen during the session with no significant respiratory distress or increased WOB noted. Left up in recliner with all needs met, nursing staff aware of patient status. May require SNF pending progress; trying to get his prosthetics into the hospital so we can further assess advanced mobility.     Follow Up Recommendations SNF;Supervision/Assistance - 24 hour;Other (comment) (TBD pending progress and if prosthetics can be brought to his room for mobility progression)    Equipment Recommendations  Wheelchair (measurements PT);Wheelchair cushion (measurements PT);3in1 (PT);Other (comment) (sliding board, drop arm 3 in1)    Recommendations for Other Services       Precautions / Restrictions Precautions Precautions: Fall;Other (comment) Precaution Comments: B BKA (does not have prosthetics in room at time of eval) Restrictions Weight Bearing Restrictions: No      Mobility  Bed Mobility Overal bed mobility: Needs Assistance Bed Mobility: Rolling;Supine to Sit Rolling: Min assist   Supine to sit: Max assist     General bed mobility  comments: Heavy assist for trunk to sit EOB (Mod A x 2 or Max A x 1) from flat bed    Transfers Overall transfer level: Needs assistance Equipment used: None Transfers: Comptroller transfers: Mod assist;+2 physical assistance;+2 safety/equipment   General transfer comment: Pt able to demo scooting with increased time and use of B UE to scoot backwards to EOB, then due to fatigue, pt required assistance Mod A x 2 to scoot over bed edge and into chair with use of pad  Ambulation/Gait             General Gait Details: deferred- prosthetics not in room  Stairs            Wheelchair Mobility    Modified Rankin (Stroke Patients Only)       Balance Overall balance assessment: Needs assistance Sitting-balance support: Bilateral upper extremity supported;Single extremity supported Sitting balance-Leahy Scale: Fair                                       Pertinent Vitals/Pain Pain Assessment: Faces Faces Pain Scale: Hurts little more Pain Location: Buttocks Pain Descriptors / Indicators: Grimacing;Sore Pain Intervention(s): Repositioned    Home Living Family/patient expects to be discharged to:: Private residence Living Arrangements: Spouse/significant other   Type of Home: House Home Access: Ramped entrance (ramp through garage)     [place]: Two level;Able to live on main level with bedroom/bathroom Home Equipment: Kasandra Knudsen - single point;Shower seat;Wheelchair - Water quality scientist - 2 wheels;Hand held shower  head;Grab bars - toilet;Shower seat - built in      Prior Function Level of Independence: Independent with assistive device(s)         Comments: Uses RW more recently and SPC prior to a few weeks ago with B prosthetic LEs. Modified Independent in ADLs, shares IADLs in the home (wife may complete majority of the time)     Hand Dominance   Dominant Hand: Right    Extremity/Trunk  Assessment   Upper Extremity Assessment Upper Extremity Assessment: Defer to OT evaluation    Lower Extremity Assessment Lower Extremity Assessment: Generalized weakness (chronic B BKAs)    Cervical / Trunk Assessment Cervical / Trunk Assessment: Normal  Communication   Communication: No difficulties  Cognition Arousal/Alertness: Awake/alert Behavior During Therapy: WFL for tasks assessed/performed Overall Cognitive Status: Within Functional Limits for tasks assessed                                        General Comments General comments (skin integrity, edema, etc.): on 2LPM O2, no O2 monitoring in room. SOB but we were unable to assess due to no monitor in room. Used PLB during session. Has B prosthetics at home, wife may be able to bring them but she also has covid symptoms. Able to pull 751mL on IS    Exercises     Assessment/Plan    PT Assessment Patient needs continued PT services  PT Problem List Decreased strength;Decreased activity tolerance;Decreased safety awareness;Decreased balance;Decreased mobility;Decreased coordination;Cardiopulmonary status limiting activity       PT Treatment Interventions DME instruction;Balance training;Gait training;Stair training;Functional mobility training;Patient/family education;Therapeutic activities;Therapeutic exercise;Wheelchair mobility training    PT Goals (Current goals can be found in the Care Plan section)  Acute Rehab PT Goals Patient Stated Goal: be able to go home from hospital PT Goal Formulation: With patient Time For Goal Achievement: 06/27/20 Potential to Achieve Goals: Fair    Frequency Min 3X/week   Barriers to discharge        Co-evaluation   Reason for Co-Treatment: Complexity of the patient's impairments (multi-system involvement);To address functional/ADL transfers;For patient/therapist safety   OT goals addressed during session: ADL's and self-care;Strengthening/ROM       AM-PAC  PT "6 Clicks" Mobility  Outcome Measure Help needed turning from your back to your side while in a flat bed without using bedrails?: A Little Help needed moving from lying on your back to sitting on the side of a flat bed without using bedrails?: A Lot Help needed moving to and from a bed to a chair (including a wheelchair)?: A Lot Help needed standing up from a chair using your arms (e.g., wheelchair or bedside chair)?: Total Help needed to walk in hospital room?: Total Help needed climbing 3-5 steps with a railing? : Total 6 Click Score: 10    End of Session Equipment Utilized During Treatment: Oxygen Activity Tolerance: Patient tolerated treatment well Patient left: in chair;with call bell/phone within reach Nurse Communication: Mobility status PT Visit Diagnosis: Other abnormalities of gait and mobility (R26.89);Muscle weakness (generalized) (M62.81)    Time: 5035-4656 PT Time Calculation (min) (ACUTE ONLY): 26 min   Charges:   PT Evaluation $PT Eval Moderate Complexity: 1 Mod (co-eval with OT)          [name], DPT, PN1   Supplemental Physical Therapist [place]    Pager [phone] Acute Rehab Office [phone]

## 2020-06-13 NOTE — Progress Notes (Signed)
ANTICOAGULATION CONSULT NOTE - Foll ow Up Consult  Pharmacy Consult for IV Heparin Indication: atrial fibrillation  Allergies  Allergen Reactions  . Metformin And Related Other (See Comments)    Has kidney problems    Patient Measurements: Height: 6' (182.9 cm) Weight: 72.9 kg (160 lb 11.5 oz) IBW/kg (Calculated) : 77.6 Heparin Dosing Weight: 72.9 kg  Vital Signs: Temp: 97.9 F (36.6 C) (01/25 0523) Temp Source: Oral (01/25 0523) BP: 145/81 (01/25 0523) Pulse Rate: 61 (01/25 1008)  Labs: Recent Labs    06/11/20 1326 06/12/20 0900 06/12/20 0907 06/12/20 2000 06/13/20 0500 06/13/20 0803  HGB 12.4*  --  11.2*  --  10.6*  --   HCT 43.1  --  36.6*  --  36.2*  --   PLT 101*  --  85*  --  106*  --   APTT  --  45*  --  49*  --  73*  HEPARINUNFRC  --  <0.10*  --   --   --  0.15*  CREATININE 5.78*  --  6.37*  --  6.91*  --     Estimated Creatinine Clearance: 10.8 mL/min (A) (by C-G formula based on SCr of 6.91 mg/dL (H)).   Assessment: 65 yr old man to transition from apixaban to heparin for afib while NPO. Pt was on apixaban PTA; last dose 06/06/20 at 0800, per med rec. Pt with ESRD, on TTS HD schedule.   PTT now therapeutic, but heparin level low  Goal of Therapy:  Heparin level 0.3-0.7 units/ml aPTT 66-102 seconds Monitor platelets by anticoagulation protocol: Yes   Plan:  Increase heparin infusion to 1600 units/hr Next labs in AM F/U transition back to apixaban when mentation improves  Thank you Anette Guarneri, PharmD 06/13/2020,10:14 AM

## 2020-06-13 NOTE — Progress Notes (Signed)
PROGRESS NOTE    Paul [name]  WUJ:[id number] DOB: [birth year] DOA: 06/11/2020 PCP: [name], MD   Brief Narrative:  66 year old with history of paroxysmal A. fib, PAD, ESRD with failed renal transplant on HD TTS, DM2, diastolic CHF presented with complaints of shortness of breath after missing dialysis.  COVID-19 positive.  Chest x-ray showed multifocal opacities with left sided pleural effusion.  He was found to have COVID-19 pneumonia, atrial fibrillation with RVR and concerns of CHF exacerbation.  Blood cultures growing Enterococcus started on vancomycin.  Infectious disease consulted.  Surveillance cultures were drawn, echocardiogram ordered.   Assessment & Plan:   Principal Problem:   Bacteremia due to Enterococcus Active Problems:   ESRD on hemodialysis (HCC)   DM (diabetes mellitus) (HCC)   GERD (gastroesophageal reflux disease)   PAF (paroxysmal atrial fibrillation) (HCC)   Pneumonia due to COVID-19 virus   Acute heart failure with preserved ejection fraction (HFpEF) (HCC)  Acute respiratory distress -Multiple etiology given underlying COVID-19 pneumonia, fluid overload from missing dialysis.  Continue supplemental oxygen as needed.  Would benefit from dialysis  Enterococcus bacteremia -Potential UA as a source.  IV vancomycin started -ID following -2D echocardiogram-pending -Surveillance cultures ordered 1/25  COVID-19 pneumonia -Chest x-ray showing bilateral opacities.  Pulmonary edema -Solu-Medrol-day 3/5 -Remdesivir-day 3 -Procalcitonin-0.44 -BNP- >4500 -Incentive spirometer, flutter valve -Follow inflammatory markers  Acute metabolic encephalopathy -No acute findings but it shows interval lacunar infarct? -On aspirin and statin -LDL 64, A1c 7.5 -UA-positive -TSH, B12 and folate  ESRD on hemodialysis, failed renal transplant.  TTS -Seen by nephrology.  HD plans for nephrology  Congestive heart failure with preserved ejection fraction -Echo  01/2019-EF 65%.  Currently in volume overload which should improve with hemodialysis.  Paroxysmal atrial fibrillation with infrequent RVR -On Norvasc and Lopressor. Ziio patch was ordered outpatient.  -Home Eliquis on hold until mentation improves -Heparin drip  Diabetes mellitus type 2, mild hyperglycemia secondary to steroid use -Increase Lantus 10 units daily.  Insulin sliding scale and Accu-Chek.  Essential hypertension -Norvasc 10 mg daily.  Metoprolol 25 mg twice daily  GERD -PPI  Depression -Zoloft at bedtime  BPH -Flomax  DVT prophylaxis: Hep gtt Code Status: Full code Family Communication: Updated his wife 1/25  Status is: Inpatient  Remains inpatient appropriate because:IV treatments appropriate due to intensity of illness or inability to take PO   Dispo: The patient is from: Home              Anticipated d/c is to: Home              Anticipated d/c date is: 2 days              Patient currently is not medically stable to d/c.   Difficult to place patient No       Body mass index is 21.8 kg/m.  Pressure Injury 01/18/20 Sacrum Stage 2 -  Partial thickness loss of dermis presenting as a shallow open injury with a red, pink wound bed without slough. (Active)  01/18/20 2251  Location: Sacrum  Location Orientation:   Staging: Stage 2 -  Partial thickness loss of dermis presenting as a shallow open injury with a red, pink wound bed without slough.  Wound Description (Comments):   Present on Admission: Yes      Subjective: Breathing is much better compared to yesterday after he got dialysis.  Denies any other complaints.  Requesting his prosthetic to be brought into the hospital.  Review of  Systems Otherwise negative except as per HPI, including: General: Denies fever, chills, night sweats or unintended weight loss. Resp: Denies cough, wheezing, shortness of breath. Cardiac: Denies chest pain, palpitations, orthopnea, paroxysmal nocturnal dyspnea. GI:  Denies abdominal pain, nausea, vomiting, diarrhea or constipation GU: Denies dysuria, frequency, hesitancy or incontinence MS: Denies muscle aches, joint pain or swelling Neuro: Denies headache, neurologic deficits (focal weakness, numbness, tingling), abnormal gait Psych: Denies anxiety, depression, SI/HI/AVH Skin: Denies new rashes or lesions ID: Denies sick contacts, exotic exposures, travel  Examination:  Constitutional: Not in acute distress Respiratory: Mild bilateral rhonchi Cardiovascular: Normal sinus rhythm, no rubs Abdomen: Nontender nondistended good bowel sounds Musculoskeletal: No edema noted, bilateral AKA noted Skin: No rashes seen Neurologic: CN 2-12 grossly intact.  And nonfocal Psychiatric: Normal judgment and insight. Alert and oriented x 3. Normal mood.  Right upper extremity fistula in place  Objective: Vitals:   06/13/20 0400 06/13/20 0415 06/13/20 0523 06/13/20 1008  BP: 139/83 (!) 143/76 (!) 145/81   Pulse: (!) 54 (!) 56 (!) 54 61  Resp: 20 (!) 22 16   Temp: 98.8 F (37.1 C)  97.9 F (36.6 C)   TempSrc:   Oral   SpO2: 98% 98% 92%   Weight:      Height:        Intake/Output Summary (Last 24 hours) at 06/13/2020 1053 Last data filed at 06/13/2020 0700 Gross per 24 hour  Intake 1220.53 ml  Output 3300 ml  Net -2079.47 ml   Filed Weights   06/11/20 2300  Weight: 72.9 kg     Data Reviewed:   CBC: Recent Labs  Lab 06/11/20 1326 06/12/20 0907 06/13/20 0500  WBC 7.6 4.4 7.5  NEUTROABS 5.7 3.5 6.1  HGB 12.4* 11.2* 10.6*  HCT 43.1 36.6* 36.2*  MCV 91.5 89.9 91.0  PLT 101* 85* 147*   Basic Metabolic Panel: Recent Labs  Lab 06/11/20 1326 06/12/20 0907 06/13/20 0500  NA 140 140 137  K 3.9 3.9 3.8  CL 101 103 98  CO2 23 22 20*  GLUCOSE 232* 271* 158*  BUN 36* 49* 66*  CREATININE 5.78* 6.37* 6.91*  CALCIUM 8.2* 7.9* 7.6*  MG  --   --  2.2   GFR: Estimated Creatinine Clearance: 10.8 mL/min (A) (by C-G formula based on SCr of 6.91  mg/dL (H)). Liver Function Tests: Recent Labs  Lab 06/11/20 1326 06/12/20 0907 06/13/20 0500  AST 23 17 15   ALT 11 11 8   ALKPHOS 80 70 71  BILITOT 1.1 1.0 0.8  PROT 6.4* 5.5* 5.3*  ALBUMIN 2.6* 2.2* 2.2*   No results for input(s): LIPASE, AMYLASE in the last 168 hours. No results for input(s): AMMONIA in the last 168 hours. Coagulation Profile: No results for input(s): INR, PROTIME in the last 168 hours. Cardiac Enzymes: No results for input(s): CKTOTAL, CKMB, CKMBINDEX, TROPONINI in the last 168 hours. BNP (last 3 results) No results for input(s): PROBNP in the last 8760 hours. HbA1C: Recent Labs    06/12/20 0907  HGBA1C 7.5*   CBG: Recent Labs  Lab 06/12/20 0809 06/12/20 1152 06/12/20 1648 06/12/20 2112 06/13/20 0705  GLUCAP 267* 195* 242* 182* 200*   Lipid Profile: Recent Labs    06/11/20 1326 06/12/20 0907  CHOL  --  100  HDL  --  27*  LDLCALC  --  64  TRIG 83 47  CHOLHDL  --  3.7   Thyroid Function Tests: Recent Labs    06/12/20 0907  TSH 1.664  Anemia Panel: Recent Labs    06/11/20 1326 06/12/20 0907 06/13/20 0803  VITAMINB12  --  1,104*  --   FERRITIN 740*  --  861*   Sepsis Labs: Recent Labs  Lab 06/11/20 1326  PROCALCITON 0.44  LATICACIDVEN 2.2*    Recent Results (from the past 240 hour(s))  SARS Coronavirus 2 by RT PCR (hospital order, performed in [facility] hospital lab) Nasopharyngeal Nasopharyngeal Swab     Status: Abnormal   Collection Time: 06/11/20 12:36 PM   Specimen: Nasopharyngeal Swab  Result Value Ref Range Status   SARS Coronavirus 2 POSITIVE (A) NEGATIVE Final    Comment: RESULT CALLED TO, READ BACK BY AND VERIFIED WITH: RN A Esperanza Heir [id number] AT 1442 BY CM (NOTE) SARS-CoV-2 target nucleic acids are DETECTED  SARS-CoV-2 RNA is generally detectable in upper respiratory specimens  during the acute phase of infection.  Positive results are indicative  of the presence of the identified virus, but do not rule  out bacterial infection or co-infection with other pathogens not detected by the test.  Clinical correlation with patient history and  other diagnostic information is necessary to determine patient infection status.  The expected result is negative.  Fact Sheet for Patients:   StrictlyIdeas.no   Fact Sheet for Healthcare Providers:   BankingDealers.co.za    This test is not yet approved or cleared by the [place] FDA and  has been authorized for detection and/or diagnosis of SARS-CoV-2 by FDA under an Emergency Use Authorization (EUA).  This EUA will remain in effect (meaning this tes t can be used) for the duration of  the COVID-19 declaration under Section 564(b)(1) of the Act, 21 U.S.C. section 360-bbb-3(b)(1), unless the authorization is terminated or revoked sooner.  Performed at [facility], [place] [address], [place], [place] [address]   Blood Culture (routine x 2)     Status: None (Preliminary result)   Collection Time: 06/11/20  1:26 PM   Specimen: BLOOD  Result Value Ref Range Status   Specimen Description BLOOD BLOOD LEFT HAND  Final   Special Requests   Final    BOTTLES DRAWN AEROBIC AND ANAEROBIC Blood Culture results may not be optimal due to an inadequate volume of blood received in culture bottles   Culture  Setup Time   Final    GRAM POSITIVE RODS ANAEROBIC BOTTLE ONLY CRITICAL RESULT CALLED TO, READ BACK BY AND VERIFIED WITH: AMANDA WOLFE PHARMD @0810  06/13/20 EB Performed at [facility], [place] [address] [place], [place] [address]    Culture GRAM POSITIVE RODS  Final   Report Status PENDING  Incomplete  Blood Culture (routine x 2)     Status: None (Preliminary result)   Collection Time: 06/11/20  1:26 PM   Specimen: BLOOD  Result Value Ref Range Status   Specimen Description BLOOD BLOOD LEFT FOREARM  Final   Special Requests   Final    BOTTLES DRAWN AEROBIC ONLY Blood Culture adequate volume    Culture  Setup Time   Final    GRAM POSITIVE COCCI IN CHAINS AEROBIC BOTTLE ONLY CRITICAL RESULT CALLED TO, READ BACK BY AND VERIFIED WITH: A. Rogers Blocker PharmD 9:30 06/12/20 ([email or web address])    Culture   Final    GRAM POSITIVE COCCI CULTURE REINCUBATED FOR BETTER GROWTH Performed at [facility], [place] [address] [place], [place] [address]    Report Status PENDING  Incomplete  Blood Culture ID Panel (Reflexed)     Status: Abnormal  Collection Time: 06/11/20  1:26 PM  Result Value Ref Range Status   Enterococcus faecalis DETECTED (A) NOT DETECTED Final    Comment: CRITICAL RESULT CALLED TO, READ BACK BY AND VERIFIED WITH: A. Rogers Blocker PharmD 9:30 06/12/20 ([email or web address])    Enterococcus Faecium NOT DETECTED NOT DETECTED Final   Listeria monocytogenes NOT DETECTED NOT DETECTED Final   Staphylococcus species DETECTED (A) NOT DETECTED Final    Comment: CRITICAL RESULT CALLED TO, READ BACK BY AND VERIFIED WITH: A. Rogers Blocker PharmD 9:30 06/12/20 ([email or web address])    Staphylococcus aureus (BCID) NOT DETECTED NOT DETECTED Final   Staphylococcus epidermidis NOT DETECTED NOT DETECTED Final   Staphylococcus lugdunensis NOT DETECTED NOT DETECTED Final   Streptococcus species NOT DETECTED NOT DETECTED Final   Streptococcus agalactiae NOT DETECTED NOT DETECTED Final   Streptococcus pneumoniae NOT DETECTED NOT DETECTED Final   Streptococcus pyogenes NOT DETECTED NOT DETECTED Final   A.calcoaceticus-baumannii NOT DETECTED NOT DETECTED Final   Bacteroides fragilis NOT DETECTED NOT DETECTED Final   Enterobacterales NOT DETECTED NOT DETECTED Final   Enterobacter cloacae complex NOT DETECTED NOT DETECTED Final   Escherichia coli NOT DETECTED NOT DETECTED Final   Klebsiella aerogenes NOT DETECTED NOT DETECTED Final   Klebsiella oxytoca NOT DETECTED NOT DETECTED Final   Klebsiella pneumoniae NOT DETECTED NOT DETECTED Final   Proteus species NOT DETECTED NOT DETECTED Final   Salmonella species NOT DETECTED NOT DETECTED Final    Serratia marcescens NOT DETECTED NOT DETECTED Final   Haemophilus influenzae NOT DETECTED NOT DETECTED Final   Neisseria meningitidis NOT DETECTED NOT DETECTED Final   Pseudomonas aeruginosa NOT DETECTED NOT DETECTED Final   Stenotrophomonas maltophilia NOT DETECTED NOT DETECTED Final   Candida albicans NOT DETECTED NOT DETECTED Final   Candida auris NOT DETECTED NOT DETECTED Final   Candida glabrata NOT DETECTED NOT DETECTED Final   Candida krusei NOT DETECTED NOT DETECTED Final   Candida parapsilosis NOT DETECTED NOT DETECTED Final   Candida tropicalis NOT DETECTED NOT DETECTED Final   Cryptococcus neoformans/gattii NOT DETECTED NOT DETECTED Final   Vancomycin resistance NOT DETECTED NOT DETECTED Final    Comment: Performed at [facility], [address], [place], [place] [address]  MRSA PCR Screening     Status: None   Collection Time: 06/12/20 12:52 AM   Specimen: Nasal Mucosa; Nasopharyngeal  Result Value Ref Range Status   MRSA by PCR NEGATIVE NEGATIVE Final    Comment:        The GeneXpert MRSA Assay (FDA approved for NASAL specimens only), is one component of a comprehensive MRSA colonization surveillance program. It is not intended to diagnose MRSA infection nor to guide or monitor treatment for MRSA infections. Performed at [facility], [place] [address], [place], [place] [address]          Radiology Studies: DG Chest 1 View  Result Date: 06/11/2020 CLINICAL DATA:  Shortness of breath, COVID-19 positive EXAM: CHEST  1 VIEW COMPARISON:  01/04/2020 FINDINGS: Mild cardiomegaly. Atherosclerotic calcification of the aortic knob. Diffuse interstitial prominence with streaky interstitial opacities most pronounced within the mid to lower lung fields. Small left pleural effusion. No pneumothorax. IMPRESSION: Diffuse interstitial prominence with streaky interstitial opacities within the mid to lower lung fields and small left pleural effusion. Findings may reflect  multifocal atypical/viral infection versus pulmonary edema. Electronically Signed   By: Davina Poke D.O.   On: 06/11/2020 14:15   CT HEAD WO CONTRAST  Result Date: 06/12/2020 CLINICAL DATA:  Altered  mental status EXAM: CT HEAD WITHOUT CONTRAST TECHNIQUE: Contiguous axial images were obtained from the base of the skull through the vertex without intravenous contrast. COMPARISON:  MRI 10/29/2018, CT FINDINGS: Brain: Mild parenchymal volume loss is present, commensurate with the patient's age. Mild periventricular white matter changes are present likely reflecting the sequela of small vessel ischemia, stable since prior examination. Tiny remote lacunar infarcts developed within the left cerebellar hemisphere, new since prior examination. No acute intracranial hemorrhage or infarct. No abnormal mass effect or midline shift. No abnormal intra or extra-axial mass lesion or fluid collection. Ventricular size is normal. Vascular: No asymmetric hyperdense vasculature at the skull base. Moderate atherosclerotic calcification within the carotid siphons and distal vertebral arteries. Skull: Intact Sinuses/Orbits: There is complete opacification of the left maxillary sinus, left ethmoid air cells, and left frontal sinus. Moderate mucosal thickening within the right sphenoid sinus. The orbits are unremarkable. Other: The mastoid air cells and middle ear cavities are clear. IMPRESSION: No evidence of acute intracranial hemorrhage or infarct. Interval development of tiny remote lacunar infarct within the left cerebellar hemisphere. Mild senescent change, stable since prior examination. Extensive left-sided paranasal sinus disease. Electronically Signed   By: Fidela Salisbury MD   On: 06/12/2020 01:40        Scheduled Meds: . amLODipine  10 mg Oral Daily  . aspirin  81 mg Oral Daily  . atorvastatin  10 mg Oral Q M,W,F  . bisacodyl  10 mg Oral Daily  . [START ON 06/15/2020] Darbepoetin Alfa  60 mcg Intravenous Q  Thu-HD  . docusate sodium  100 mg Oral BID  . insulin aspart  0-20 Units Subcutaneous TID WC  . insulin glargine  8 Units Subcutaneous Daily  . Ipratropium-Albuterol  1 puff Inhalation Q6H  . lipase/protease/amylase  12,000 Units Oral TID AC  . magnesium chloride  2 tablet Oral BID  . methylPREDNISolone (SOLU-MEDROL) injection  1 mg/kg Intravenous Q12H   Followed by  . [START ON 06/14/2020] predniSONE  50 mg Oral Daily  . metoprolol tartrate  25 mg Oral BID  . pantoprazole  80 mg Oral Daily  . polyethylene glycol  17 g Oral Daily  . sertraline  50 mg Oral QHS  . sodium chloride flush  3 mL Intravenous Q12H  . tacrolimus  2 mg Oral BID  . tamsulosin  0.4 mg Oral QPC supper   Continuous Infusions: . sodium chloride    . heparin 1,500 Units/hr (06/12/20 2219)  . remdesivir 100 mg in NS 100 mL 100 mg (06/13/20 1008)  . vancomycin       LOS: 2 days   Time spent= 35 mins    Kentavius Dettore Arsenio Loader, MD [facility]  If 7PM-7AM, please contact night-coverage  06/13/2020, 10:53 AM

## 2020-06-13 NOTE — Progress Notes (Signed)
  Echocardiogram 2D Echocardiogram has been performed.  Paul [name] 06/13/2020, 5:42 PM

## 2020-06-13 NOTE — Progress Notes (Signed)
Modified Barium Swallow Progress Note  Patient Details  Name: Paul [name] MRN: [id number] Date of Birth: October 29, 1955  Today's Date: 06/13/2020  Modified Barium Swallow completed.  Full report located under Chart Review in the Imaging Section.  Brief recommendations include the following:  Clinical Impression  Pt presents with oropharyngeal dysphagia characterized by reduced bolus cohesion, reduced lingual retraction, reduced pharyngeal constriction, an inconsistent pharyngeal delay, and reduced anterior laryngeal movement. He demonstrated moderate vallecular residue, mild pyriform sinus residue, and mild posterior pharyngeal wall residue. Amount of residue increased with bolus size. Residue was reduced, but not eliminated with secondary swallows or a liquid wash. A single instance of aspiration (PAS 7) was noted before deglutition due to premature spillage and a pharyngeal delay. Aspiration was sensed, but cough was weak and ineffective. A chin tuck posture was effective in reducing premature spillage and in reducing pharyngeal residue. It is recommended that the pt's current diet of dysphagia 2 solids and nectar thick liquids be continued with observance of swallowing precautions. There is potential for diet advancement to include thin liquids with consistent use of a chin tuck, but SLP will follow for dysphagia treatment and trial this more in sessions.   Swallow Evaluation Recommendations       SLP Diet Recommendations: Nectar thick liquid;Dysphagia 2 (Fine chop) solids   Liquid Administration via: Cup;Straw   Medication Administration: Whole meds with liquid   Supervision: Intermittent supervision to cue for compensatory strategies   Compensations: Slow rate;Small sips/bites;Chin tuck   Postural Changes: Seated upright at 90 degrees   Oral Care Recommendations: Oral care BID   Other Recommendations: Order thickener from pharmacy;Have oral suction available  Mathias Bogacki I. Hardin Negus,  [place], [place] Office number [phone] Pager [place] 06/13/2020,5:01 PM

## 2020-06-14 DIAGNOSIS — I420 Dilated cardiomyopathy: Secondary | ICD-10-CM

## 2020-06-14 DIAGNOSIS — R7881 Bacteremia: Secondary | ICD-10-CM | POA: Diagnosis not present

## 2020-06-14 DIAGNOSIS — B952 Enterococcus as the cause of diseases classified elsewhere: Secondary | ICD-10-CM | POA: Diagnosis not present

## 2020-06-14 DIAGNOSIS — I4891 Unspecified atrial fibrillation: Secondary | ICD-10-CM

## 2020-06-14 DIAGNOSIS — I5031 Acute diastolic (congestive) heart failure: Secondary | ICD-10-CM

## 2020-06-14 DIAGNOSIS — U071 COVID-19: Secondary | ICD-10-CM | POA: Diagnosis not present

## 2020-06-14 DIAGNOSIS — N186 End stage renal disease: Secondary | ICD-10-CM | POA: Diagnosis not present

## 2020-06-14 LAB — CBC WITH DIFFERENTIAL/PLATELET
Abs Immature Granulocytes: 0.03 10*3/uL (ref 0.00–0.07)
Basophils Absolute: 0 10*3/uL (ref 0.0–0.1)
Basophils Relative: 0 %
Eosinophils Absolute: 0 10*3/uL (ref 0.0–0.5)
Eosinophils Relative: 0 %
HCT: 37 % — ABNORMAL LOW (ref 39.0–52.0)
Hemoglobin: 11.6 g/dL — ABNORMAL LOW (ref 13.0–17.0)
Immature Granulocytes: 0 %
Lymphocytes Relative: 9 %
Lymphs Abs: 0.8 10*3/uL (ref 0.7–4.0)
MCH: 27.5 pg (ref 26.0–34.0)
MCHC: 31.4 g/dL (ref 30.0–36.0)
MCV: 87.7 fL (ref 80.0–100.0)
Monocytes Absolute: 0.3 10*3/uL (ref 0.1–1.0)
Monocytes Relative: 3 %
Neutro Abs: 7.3 10*3/uL (ref 1.7–7.7)
Neutrophils Relative %: 88 %
Platelets: 102 10*3/uL — ABNORMAL LOW (ref 150–400)
RBC: 4.22 MIL/uL (ref 4.22–5.81)
RDW: 17.6 % — ABNORMAL HIGH (ref 11.5–15.5)
WBC: 8.3 10*3/uL (ref 4.0–10.5)
nRBC: 0.2 % (ref 0.0–0.2)

## 2020-06-14 LAB — CULTURE, BLOOD (ROUTINE X 2): Special Requests: ADEQUATE

## 2020-06-14 LAB — COMPREHENSIVE METABOLIC PANEL
ALT: 13 U/L (ref 0–44)
AST: 23 U/L (ref 15–41)
Albumin: 2.1 g/dL — ABNORMAL LOW (ref 3.5–5.0)
Alkaline Phosphatase: 71 U/L (ref 38–126)
Anion gap: 17 — ABNORMAL HIGH (ref 5–15)
BUN: 58 mg/dL — ABNORMAL HIGH (ref 8–23)
CO2: 21 mmol/L — ABNORMAL LOW (ref 22–32)
Calcium: 7.3 mg/dL — ABNORMAL LOW (ref 8.9–10.3)
Chloride: 100 mmol/L (ref 98–111)
Creatinine, Ser: 5.47 mg/dL — ABNORMAL HIGH (ref 0.61–1.24)
GFR, Estimated: 11 mL/min — ABNORMAL LOW (ref >60)
Glucose, Bld: 164 mg/dL — ABNORMAL HIGH (ref 70–99)
Potassium: 3.2 mmol/L — ABNORMAL LOW (ref 3.5–5.1)
Sodium: 138 mmol/L (ref 135–145)
Total Bilirubin: 1.1 mg/dL (ref 0.3–1.2)
Total Protein: 5 g/dL — ABNORMAL LOW (ref 6.5–8.1)

## 2020-06-14 LAB — GLUCOSE, CAPILLARY
Glucose-Capillary: 117 mg/dL — ABNORMAL HIGH (ref 70–99)
Glucose-Capillary: 174 mg/dL — ABNORMAL HIGH (ref 70–99)
Glucose-Capillary: 154 mg/dL — ABNORMAL HIGH (ref 70–99)
Glucose-Capillary: 153 mg/dL — ABNORMAL HIGH (ref 70–99)
Glucose-Capillary: 108 mg/dL — ABNORMAL HIGH (ref 70–99)

## 2020-06-14 LAB — APTT: aPTT: >200 seconds (ref 24–36)

## 2020-06-14 LAB — C-REACTIVE PROTEIN: CRP: 7.3 mg/dL — ABNORMAL HIGH (ref <1.0)

## 2020-06-14 LAB — D-DIMER, QUANTITATIVE: D-Dimer, Quant: 2.38 ug/mL-FEU — ABNORMAL HIGH (ref 0.00–0.50)

## 2020-06-14 LAB — HEPARIN LEVEL (UNFRACTIONATED): Heparin Unfractionated: 0.6 IU/mL (ref 0.30–0.70)

## 2020-06-14 LAB — FERRITIN: Ferritin: 322 ng/mL (ref 24–336)

## 2020-06-14 LAB — MAGNESIUM: Magnesium: 2.2 mg/dL (ref 1.7–2.4)

## 2020-06-14 MED ORDER — HYDRALAZINE HCL 10 MG PO TABS
10.0000 mg | ORAL_TABLET | Freq: Three times a day (TID) | ORAL | Status: DC
  Administered 2020-06-14 – 2020-07-07 (×64): 10 mg via ORAL
  Filled 2020-06-14 (×64): qty 1

## 2020-06-14 MED ORDER — ISOSORBIDE MONONITRATE ER 30 MG PO TB24
15.0000 mg | ORAL_TABLET | Freq: Every day | ORAL | Status: DC
  Administered 2020-06-14 – 2020-07-17 (×33): 15 mg via ORAL
  Filled 2020-06-14 (×34): qty 1

## 2020-06-14 MED ORDER — IPRATROPIUM-ALBUTEROL 20-100 MCG/ACT IN AERS: 1.0000 | INHALATION_SPRAY | Freq: Four times a day (QID) | RESPIRATORY_TRACT | Status: DC | PRN

## 2020-06-14 NOTE — Progress Notes (Signed)
[facility] for Infectious Disease  Date of Admission:  06/11/2020      Total days of antibiotics 4 Vancomycin           ASSESSMENT: Paul [name] is a 65 y.o. male with ESRD here with hypoxia and fatigue in the setting of COVID pneumonia. He was also found to have bacteremia with enterococcus faecalis + staphylococcus capitus in 1/4 cultures. Now with growth of gram positive rod in other set of blood cultures that has yet to be identified. His fevers have abated, no leukocytosis. TTE is negative. I don't think he needs a TEE given good quality study and rapid improvement. Will plan 2 week course of IV vancomycin following HD sessions with last dose 2/7.   Will follow micro remotely and adjust plans if indicated. Otherwise he can follow up outpatient with his regular health care team outpatient.   Would recommend 21 day isolation period for COVID given hypoxia and treatment required for severity of symptoms.    PLAN: 1. Vancomycin IV after HD with last dose 2/7 2. Following micro remotely  3. COVID treatment per primary team - 21 day isolation recommended.    Principal Problem:   Bacteremia due to Enterococcus Active Problems:   Pneumonia due to COVID-19 virus   ESRD on hemodialysis ([facility])   DM (diabetes mellitus) ([facility])   GERD (gastroesophageal reflux disease)   PAF (paroxysmal atrial fibrillation) (HCC)   Acute heart failure with preserved ejection fraction (HFpEF) ([facility])   . amLODipine  10 mg Oral Daily  . aspirin  81 mg Oral Daily  . atorvastatin  10 mg Oral Q M,W,F  . bisacodyl  10 mg Oral Daily  . [START ON 06/15/2020] Darbepoetin Alfa  60 mcg Intravenous Q Thu-HD  . docusate sodium  100 mg Oral BID  . insulin aspart  0-20 Units Subcutaneous TID WC  . insulin glargine  10 Units Subcutaneous Daily  . lipase/protease/amylase  12,000 Units Oral TID AC  . magnesium chloride  2 tablet Oral BID  . metoprolol tartrate  25 mg Oral BID  . pantoprazole  80 mg Oral  Daily  . polyethylene glycol  17 g Oral Daily  . predniSONE  50 mg Oral Daily  . sertraline  50 mg Oral QHS  . sodium chloride flush  3 mL Intravenous Q12H  . tacrolimus  2 mg Oral BID  . tamsulosin  0.4 mg Oral QPC supper    SUBJECTIVE: Feeling better today. Still on 2 LPM oxygen.    Review of Systems: Review of Systems  Constitutional: Negative for chills and fever.  HENT: Negative for tinnitus.   Eyes: Negative for blurred vision and photophobia.  Respiratory: Negative for cough and sputum production.   Cardiovascular: Negative for chest pain.  Gastrointestinal: Negative for diarrhea, nausea and vomiting.  Genitourinary: Negative for dysuria.  Skin: Negative for rash.  Neurological: Positive for weakness. Negative for headaches.    Allergies  Allergen Reactions  . Metformin And Related Other (See Comments)    Has kidney problems    OBJECTIVE: Vitals:   06/13/20 1056 06/13/20 2007 06/14/20 0358 06/14/20 1056  BP: 132/69 116/71 125/73 120/69  Pulse: (!) 52 (!) 46  (!) 59  Resp: 18 17  17   Temp: (!) 96.7 F (35.9 C) 98 F (36.7 C)  97.9 F (36.6 C)  TempSrc: Oral Oral    SpO2: 100% 98% 100% 92%  Weight:  Height:       Body mass index is 21.8 kg/m.  Physical Exam Constitutional:      Appearance: He is well-developed and well-nourished.     Comments: Resting comfortably in bed   HENT:     Mouth/Throat:     Mouth: Oropharynx is clear and moist and mucous membranes are normal.     Dentition: Normal dentition. No dental abscesses.  Cardiovascular:     Rate and Rhythm: Normal rate and regular rhythm.     Heart sounds: Normal heart sounds.  Pulmonary:     Effort: Pulmonary effort is normal.     Breath sounds: Normal breath sounds.  Abdominal:     General: There is no distension.     Palpations: Abdomen is soft.     Tenderness: There is no abdominal tenderness.  Skin:    General: Skin is warm and dry.     Findings: No rash.  Neurological:     Mental  Status: He is alert and oriented to person, place, and time.  Psychiatric:        Mood and Affect: Mood and affect normal.        Judgment: Judgment normal.     Lab Results Lab Results  Component Value Date   WBC 8.3 06/14/2020   HGB 11.6 (L) 06/14/2020   HCT 37.0 (L) 06/14/2020   MCV 87.7 06/14/2020   PLT 102 (L) 06/14/2020    Lab Results  Component Value Date   CREATININE 5.47 (H) 06/14/2020   BUN 58 (H) 06/14/2020   NA 138 06/14/2020   K 3.2 (L) 06/14/2020   CL 100 06/14/2020   CO2 21 (L) 06/14/2020    Lab Results  Component Value Date   ALT 13 06/14/2020   AST 23 06/14/2020   ALKPHOS 71 06/14/2020   BILITOT 1.1 06/14/2020     Microbiology: BCx 1/23 >> + enterococcus faecalis, gram positive rods    Janene Madeira, MSN, NP-C [facility] [email or web address] Pager: [phone] Office: [phone] [place]: [phone]

## 2020-06-14 NOTE — Progress Notes (Signed)
[facility] Progress Note  Subjective: pt seen in room. No c/o's today.      Background on admission: Paul [name] is a 65 y.o. 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 Vitals:   06/13/20 1056 06/13/20 2007 06/14/20 0358 06/14/20 1056  BP: 132/69 116/71 125/73 120/69  Pulse: (!) 52 (!) 46  (!) 59  Resp: 18 17  17   Temp: (!) 96.7 F (35.9 C) 98 F (36.7 C)  97.9 F (36.6 C)  TempSrc: Oral Oral    SpO2: 100% 98% 100% 92%  Weight:      Height:        Exam:   alert, very chronically ill, frail, nad   no jvd  Chest cta bilat  Cor reg no RG  Abd soft ntnd no ascites   Ext B BKA, no stump edema   Alert, NF, ox3, gen 'd weakness   RFA AVF +Bruit        OP HD: TTS NW  3.5h   66kh  3K/2.25 bath  RFA AVF  Hep none  - hect 2 ug  - mircera 200 q2 , last 1/13   CXR 1/23 - IMPRESSION: Diffuse interstitial prominence with streaky interstitial opacities within the mid to lower lung fields and small left pleural effusion. Findings may reflect multifocal atypical/viral infection versus pulmonary edema.  Assessment/ Plan: 1.  COVID-19 pneumonia: With O2 requirement. Per admitting team. 2.  Tachycardia/A-fib RVR: S/p Diltiazem. Per primary team. 3.  Hypoxemia: on 2 L [place], possibly some edema by CXR in addition to patchy infiltrates 4.  ESRD: Usual TTS schedule - missed HD Sat 1/22. Had HD here 1/24. Next HD tomorrow on schedule.   5.  Hypertension/volume: BP high, up 6kg by wt's.  Max UF w/ HD.  6.  Anemia: Hgb > 12, no ESA for now. 7.  Metabolic bone disease: CorrCa ok, Phos pending. Resume home binders (Renvela). 8.  T1DM: Insulin per primary   Paul [name] 06/14/2020, 4:30 PM   Recent Labs  Lab 06/13/20 0500 06/14/20 0447  K 3.8 3.2*  BUN 66* 58*  CREATININE 6.91* 5.47*  CALCIUM 7.6* 7.3*   HGB 10.6* 11.6*   Inpatient medications: . aspirin  81 mg Oral Daily  . atorvastatin  10 mg Oral Q M,W,F  . bisacodyl  10 mg Oral Daily  . [START ON 06/15/2020] Darbepoetin Alfa  60 mcg Intravenous Q Thu-HD  . docusate sodium  100 mg Oral BID  . insulin aspart  0-20 Units Subcutaneous TID WC  . insulin glargine  10 Units Subcutaneous Daily  . lipase/protease/amylase  12,000 Units Oral TID AC  . magnesium chloride  2 tablet Oral BID  . pantoprazole  80 mg Oral Daily  . polyethylene glycol  17 g Oral Daily  . predniSONE  50 mg Oral Daily  . sertraline  50 mg Oral QHS  . sodium chloride flush  3 mL Intravenous Q12H  . tacrolimus  2 mg Oral BID  . tamsulosin  0.4 mg Oral QPC supper   . sodium chloride    . heparin 1,600 Units/hr (06/14/20 0342)  . remdesivir 100 mg in NS 100 mL 100 mg (06/14/20 0838)  . vancomycin 750 mg (06/13/20 1118)   sodium chloride, acetaminophen, chlorpheniramine-HYDROcodone, guaiFENesin-dextromethorphan, Ipratropium-Albuterol, metoprolol tartrate, Resource ThickenUp Clear, sodium chloride flush, traMADol

## 2020-06-14 NOTE — Progress Notes (Signed)
CRITICAL VALUE ALERT  Critical Value:  APTT greater than 200  Date & Time Notied: 06/14/20 @ 6:20am   Provider Notified: Baxter Kail, Pharmacist  Orders Received/Actions taken: No new orders.

## 2020-06-14 NOTE — Progress Notes (Signed)
Patient experienced sustained bradycardia in the 40s overnight. Other vital signs stable and patient is asymptomatic. Mansy, MD notified. Will continue to monitor.   Milagros Loll, RN.

## 2020-06-14 NOTE — Progress Notes (Signed)
PROGRESS NOTE    Paul [name]  TDV:[id number] DOB: 1955-11-26 DOA: 06/11/2020 PCP: [name], MD   Brief Narrative:  65 year old with history of paroxysmal A. fib, PAD, ESRD with failed renal transplant on HD TTS, DM2, diastolic CHF presented with complaints of shortness of breath after missing dialysis.  COVID-19 positive.  Chest x-ray showed multifocal opacities with left sided pleural effusion.  He was found to have COVID-19 pneumonia, atrial fibrillation with RVR and concerns of CHF exacerbation.  Blood cultures growing Enterococcus started on vancomycin.  Infectious disease consulted.  Surveillance cultures were drawn, echocardiogram ordered.   Assessment & Plan: Acute respiratory distress Multiple etiology given underlying COVID-19 pneumonia, fluid overload from missing dialysis.   Continue supplemental oxygen as needed.   Enterococcus bacteremia Potential UA as a source.  IV vancomycin started ID following 2D echocardiogram no vegetation. EF 25-30%.  Surveillance cultures ordered 1/25  COVID-19 pneumonia Chest x-ray showing bilateral opacities.  Pulmonary edema Treated Solu-Medrol, Remdesivir, Procalcitonin-0.44 BNP- >4500 Incentive spirometer, flutter valve  Acute metabolic encephalopathy No acute findings but it shows interval lacunar infarct? On aspirin and statin LDL 64, A1c 7.5 UA-positive TSH, B12 and folate  ESRD on hemodialysis, failed renal transplant.  TTS Seen by nephrology.  HD plans for nephrology  Congestive heart failure with preserved ejection fraction Echo 01/2019-EF 65%.  Currently in volume overload which should improve with hemodialysis.  Paroxysmal atrial fibrillation with infrequent RVR On Norvasc and Lopressor. Ziio patch was ordered outpatient.  Home Eliquis on hold until mentation improves Heparin for now, if no work up by Cardiology will switch back to apixaban  Diabetes mellitus type 2, mild hyperglycemia secondary to steroid  use Increase Lantus 10 units daily.  Insulin sliding scale and Accu-Chek.  Essential hypertension Norvasc 10 mg daily.  Metoprolol 25 mg twice daily  GERD PPI  Depression Zoloft at bedtime  BPH Flomax  DVT prophylaxis: Hep gtt Code Status: Full code Family Communication: pt told he will notify his family.  Status is: Inpatient  Remains inpatient appropriate because:IV treatments appropriate due to intensity of illness or inability to take PO   Dispo: The patient is from: Home              Anticipated d/c is to: Home              Anticipated d/c date is: 2 days              Patient currently is not medically stable to d/c.   Difficult to place patient No  Body mass index is 21.8 kg/m.  Pressure Injury 01/18/20 Sacrum Stage 2 -  Partial thickness loss of dermis presenting as a shallow open injury with a red, pink wound bed without slough. (Active)  01/18/20 2251  Location: Sacrum  Location Orientation:   Staging: Stage 2 -  Partial thickness loss of dermis presenting as a shallow open injury with a red, pink wound bed without slough.  Wound Description (Comments):   Present on Admission: Yes      Subjective: No acute complains, no fever or no chest pain  Examination:  General: Appear in mild distress, no Rash; Oral Mucosa Clear, moist. no Abnormal Neck Mass Or lumps, Conjunctiva normal  Cardiovascular: S1 and S2 Present, no Murmur, Respiratory: increased respiratory effort, Bilateral Air entry present and bilateral  Crackles, no wheezes Abdomen: Bowel Sound present, Soft and no tenderness Extremities: trace Pedal edema Neurology: alert and oriented to time, place, and person affect appropriate. no  new focal deficit Gait not checked due to patient safety concerns   Right upper extremity fistula in place  Objective: Vitals:   06/14/20 0358 06/14/20 1056 06/14/20 1650 06/14/20 2000  BP: 125/73 120/69 118/62 128/78  Pulse:  (!) 59 (!) 52 (!) 48  Resp:  _0 Temp:  97.9 F (36.6 C) 98.2 F (36.8 C)   TempSrc:    Oral  SpO2: 100% 92% 97% 99%  Weight:      Height:        Intake/Output Summary (Last 24 hours) at 06/14/2020 2021 Last data filed at 06/14/2020 1300 Gross per 24 hour  Intake 480 ml  Output -  Net 480 ml   Filed Weights   06/11/20 2300  Weight: 72.9 kg     Data Reviewed:   CBC: Recent Labs  Lab 06/11/20 1326 06/12/20 0907 06/12/20 1026 06/13/20 0500 06/14/20 0447  WBC 7.6 4.4  --  7.5 8.3  NEUTROABS 5.7 3.5  --  6.1 7.3  HGB 12.4* 11.2*  --  10.6* 11.6*  HCT 43.1 36.6* 35.3* 36.2* 37.0*  MCV 91.5 89.9  --  91.0 87.7  PLT 101* 85*  --  106* 244*   Basic Metabolic Panel: Recent Labs  Lab 06/11/20 1326 06/12/20 0907 06/13/20 0500 06/14/20 0447  NA 140 140 137 138  K 3.9 3.9 3.8 3.2*  CL 101 103 98 100  CO2 23 22 20* 21*  GLUCOSE 232* 271* 158* 164*  BUN 36* 49* 66* 58*  CREATININE 5.78* 6.37* 6.91* 5.47*  CALCIUM 8.2* 7.9* 7.6* 7.3*  MG  --   --  2.2 2.2   GFR: Estimated Creatinine Clearance: 13.7 mL/min (A) (by C-G formula based on SCr of 5.47 mg/dL (H)). Liver Function Tests: Recent Labs  Lab 06/11/20 1326 06/12/20 0907 06/13/20 0500 06/14/20 0447  AST _1 ALT _2 ALKPHOS 80 70 71 71  BILITOT 1.1 1.0 0.8 1.1  PROT 6.4* 5.5* 5.3* 5.0*  ALBUMIN 2.6* 2.2* 2.2* 2.1*   No results for input(s): LIPASE, AMYLASE in the last 168 hours. No results for input(s): AMMONIA in the last 168 hours. Coagulation Profile: No results for input(s): INR, PROTIME in the last 168 hours. Cardiac Enzymes: No results for input(s): CKTOTAL, CKMB, CKMBINDEX, TROPONINI in the last 168 hours. BNP (last 3 results) No results for input(s): PROBNP in the last 8760 hours. HbA1C: Recent Labs    06/12/20 0907  HGBA1C 7.5*   CBG: Recent Labs  Lab 06/13/20 1623 06/13/20 2119 06/14/20 0731 06/14/20 1209 06/14/20 1648  GLUCAP 140* 117* 174* 154* 153*   Lipid Profile: Recent Labs     06/12/20 0907  CHOL 100  HDL 27*  LDLCALC 64  TRIG 47  CHOLHDL 3.7   Thyroid Function Tests: Recent Labs    06/12/20 0907  TSH 1.664   Anemia Panel: Recent Labs    06/12/20 0907 06/13/20 0803 06/14/20 0447  VITAMINB12 1,104*  --   --   FERRITIN  --  861* 322   Sepsis Labs: Recent Labs  Lab 06/11/20 1326  PROCALCITON 0.44  LATICACIDVEN 2.2*    Recent Results (from the past 240 hour(s))  SARS Coronavirus 2 by RT PCR (hospital order, performed in [place] hospital lab) Nasopharyngeal Nasopharyngeal Swab     Status: Abnormal   Collection Time: 06/11/20 12:36 PM   Specimen: Nasopharyngeal Swab  Result Value Ref Range Status   SARS Coronavirus 2  POSITIVE (A) NEGATIVE Final    Comment: RESULT CALLED TO, READ BACK BY AND VERIFIED WITH: RN A Esperanza Heir [id number] AT 1442 BY CM (NOTE) SARS-CoV-2 target nucleic acids are DETECTED  SARS-CoV-2 RNA is generally detectable in upper respiratory specimens  during the acute phase of infection.  Positive results are indicative  of the presence of the identified virus, but do not rule out bacterial infection or co-infection with other pathogens not detected by the test.  Clinical correlation with patient history and  other diagnostic information is necessary to determine patient infection status.  The expected result is negative.  Fact Sheet for Patients:   StrictlyIdeas.no   Fact Sheet for Healthcare Providers:   BankingDealers.co.za    This test is not yet approved or cleared by the [place] FDA and  has been authorized for detection and/or diagnosis of SARS-CoV-2 by FDA under an Emergency Use Authorization (EUA).  This EUA will remain in effect (meaning this tes t can be used) for the duration of  the COVID-19 declaration under Section 564(b)(1) of the Act, 21 U.S.C. section 360-bbb-3(b)(1), unless the authorization is terminated or revoked sooner.  Performed at [facility], [place] [address], [place], [place] [address]   Blood Culture (routine x 2)     Status: None (Preliminary result)   Collection Time: 06/11/20  1:26 PM   Specimen: BLOOD  Result Value Ref Range Status   Specimen Description BLOOD BLOOD LEFT HAND  Final   Special Requests   Final    BOTTLES DRAWN AEROBIC AND ANAEROBIC Blood Culture results may not be optimal due to an inadequate volume of blood received in culture bottles   Culture  Setup Time   Final    GRAM POSITIVE RODS ANAEROBIC BOTTLE ONLY CRITICAL RESULT CALLED TO, READ BACK BY AND VERIFIED WITH: AMANDA WOLFE PHARMD _0  06/13/20 EB    Culture   Final    GRAM POSITIVE RODS IDENTIFICATION TO FOLLOW Performed at [facility], [place] [address] [place], [place] [address]    Report Status PENDING  Incomplete  Blood Culture (routine x 2)     Status: Abnormal   Collection Time: 06/11/20  1:26 PM   Specimen: BLOOD  Result Value Ref Range Status   Specimen Description BLOOD BLOOD LEFT FOREARM  Final   Special Requests   Final    BOTTLES DRAWN AEROBIC ONLY Blood Culture adequate volume   Culture  Setup Time   Final    GRAM POSITIVE COCCI IN CHAINS AEROBIC BOTTLE ONLY CRITICAL RESULT CALLED TO, READ BACK BY AND VERIFIED WITH: A. Rogers Blocker PharmD 9:30 06/12/20 ([email or web address])    Culture (A)  Final    ENTEROCOCCUS FAECALIS STAPHYLOCOCCUS CAPITIS THE SIGNIFICANCE OF ISOLATING THIS ORGANISM FROM A SINGLE SET OF BLOOD CULTURES WHEN MULTIPLE SETS ARE DRAWN IS UNCERTAIN. PLEASE NOTIFY THE MICROBIOLOGY DEPARTMENT WITHIN ONE WEEK IF SPECIATION AND SENSITIVITIES ARE REQUIRED. Performed at [facility], [place] [address] [place], [place] [address]    Report Status 06/14/2020 FINAL  Final   Organism ID, Bacteria ENTEROCOCCUS FAECALIS  Final      Susceptibility   Enterococcus faecalis - MIC*    AMPICILLIN <=2 SENSITIVE Sensitive     VANCOMYCIN 1 SENSITIVE Sensitive     GENTAMICIN SYNERGY SENSITIVE Sensitive     * ENTEROCOCCUS FAECALIS   Blood Culture ID Panel (Reflexed)     Status: Abnormal   Collection Time: 06/11/20  1:26 PM  Result Value Ref Range Status  Enterococcus faecalis DETECTED (A) NOT DETECTED Final    Comment: CRITICAL RESULT CALLED TO, READ BACK BY AND VERIFIED WITH: A. Rogers Blocker PharmD 9:30 06/12/20 ([email or web address])    Enterococcus Faecium NOT DETECTED NOT DETECTED Final   Listeria monocytogenes NOT DETECTED NOT DETECTED Final   Staphylococcus species DETECTED (A) NOT DETECTED Final    Comment: CRITICAL RESULT CALLED TO, READ BACK BY AND VERIFIED WITH: A. Rogers Blocker PharmD 9:30 06/12/20 ([email or web address])    Staphylococcus aureus (BCID) NOT DETECTED NOT DETECTED Final   Staphylococcus epidermidis NOT DETECTED NOT DETECTED Final   Staphylococcus lugdunensis NOT DETECTED NOT DETECTED Final   Streptococcus species NOT DETECTED NOT DETECTED Final   Streptococcus agalactiae NOT DETECTED NOT DETECTED Final   Streptococcus pneumoniae NOT DETECTED NOT DETECTED Final   Streptococcus pyogenes NOT DETECTED NOT DETECTED Final   A.calcoaceticus-baumannii NOT DETECTED NOT DETECTED Final   Bacteroides fragilis NOT DETECTED NOT DETECTED Final   Enterobacterales NOT DETECTED NOT DETECTED Final   Enterobacter cloacae complex NOT DETECTED NOT DETECTED Final   Escherichia coli NOT DETECTED NOT DETECTED Final   Klebsiella aerogenes NOT DETECTED NOT DETECTED Final   Klebsiella oxytoca NOT DETECTED NOT DETECTED Final   Klebsiella pneumoniae NOT DETECTED NOT DETECTED Final   Proteus species NOT DETECTED NOT DETECTED Final   Salmonella species NOT DETECTED NOT DETECTED Final   Serratia marcescens NOT DETECTED NOT DETECTED Final   Haemophilus influenzae NOT DETECTED NOT DETECTED Final   Neisseria meningitidis NOT DETECTED NOT DETECTED Final   Pseudomonas aeruginosa NOT DETECTED NOT DETECTED Final   Stenotrophomonas maltophilia NOT DETECTED NOT DETECTED Final   Candida albicans NOT DETECTED NOT DETECTED Final   Candida auris NOT DETECTED NOT  DETECTED Final   Candida glabrata NOT DETECTED NOT DETECTED Final   Candida krusei NOT DETECTED NOT DETECTED Final   Candida parapsilosis NOT DETECTED NOT DETECTED Final   Candida tropicalis NOT DETECTED NOT DETECTED Final   Cryptococcus neoformans/gattii NOT DETECTED NOT DETECTED Final   Vancomycin resistance NOT DETECTED NOT DETECTED Final    Comment: Performed at [facility], [address] [place], [place] [address]  MRSA PCR Screening     Status: None   Collection Time: 06/12/20 12:52 AM   Specimen: Nasal Mucosa; Nasopharyngeal  Result Value Ref Range Status   MRSA by PCR NEGATIVE NEGATIVE Final    Comment:        The GeneXpert MRSA Assay (FDA approved for NASAL specimens only), is one component of a comprehensive MRSA colonization surveillance program. It is not intended to diagnose MRSA infection nor to guide or monitor treatment for MRSA infections. Performed at [facility], [place] [address] [place], [place] [address]   Culture, blood (routine x 2)     Status: None (Preliminary result)   Collection Time: 06/13/20  8:03 AM   Specimen: BLOOD LEFT HAND  Result Value Ref Range Status   Specimen Description BLOOD LEFT HAND  Final   Special Requests   Final    BOTTLES DRAWN AEROBIC AND ANAEROBIC Blood Culture adequate volume   Culture   Final    NO GROWTH 1 DAY Performed at [facility], [place] [address], [place], [place] [address]    Report Status PENDING  Incomplete  Culture, blood (routine x 2)     Status: None (Preliminary result)   Collection Time: 06/13/20  8:03 AM   Specimen: BLOOD LEFT HAND  Result Value Ref Range Status   Specimen Description BLOOD LEFT HAND  Final   Special Requests   Final    BOTTLES DRAWN AEROBIC AND ANAEROBIC Blood Culture adequate volume   Culture   Final    NO GROWTH 1 DAY Performed at [facility], [place] [address], [place], [place] [address]    Report Status PENDING  Incomplete         Radiology  Studies: DG Swallowing Func-Speech Pathology  Result Date: 06/13/2020 Objective Swallowing Evaluation: Type of Study: MBS-Modified Barium Swallow Study  Patient Details Name: Paul REPETTO MRN: [id number] Date of Birth: 1955/09/22 Today's Date: 06/13/2020 Time: SLP Start Time (ACUTE ONLY): 1600 -SLP Stop Time (ACUTE ONLY): 1620 SLP Time Calculation (min) (ACUTE ONLY): 20 min Past Medical History: Past Medical History: Diagnosis Date . A-fib ([place])  . Anemia   esrd . Calciphylaxis 05/2013  lower ext . Cancer ([place])   hx duodenal adenoCa 2002, resected . Closed left arm fracture 1967 . Corneal abrasion, left  . Depression  . Diabetes mellitus   Type 1 iddm x 11 yrs . Diabetic neuropathy ([place])  . ESRD on hemodialysis ([place])   Pt has ESRD due to DM.  He had a L upper arm AVF prior to starting HD which was ligated due to L arm steal syndrome.  He started HD in Jan 2014 and did home HD.  The family couldn't cannulate the R arm AVF successfully so by mid 2014 they decided to switch to PD which was done late summer 2014.  In Jan 2015 he was admitted with FTT felt to be due to underdialysis on PD and PD was abandoned and he  . GERD (gastroesophageal reflux disease)  . Glomerulosclerosis, diabetic ([place])  . Heart murmur  . History of blood transfusion  . Hypertension   off of meds due to orthostatic hypotension . Kidney transplant recipient  . Open wound of both legs with complication   Pt has had progressive wounds of both LE's including gangrene of the toes and patchy necrosis of the calves.  He has been treated at [facility] by Dr Jerline Pain with hyperbaric O2 5d per week.  He is getting Na Thiosulfate with HD for suspected calciphylaxis. Pt says doctor's aren't sure if these ulcers were diabetic ulcers or calciphylaxis.  He underwent bilat BKA on 07/30/13.  He says that the woun Past Surgical History: Past Surgical History: Procedure Laterality Date . AMPUTATION Bilateral 07/30/2013  Procedure: AMPUTATION BELOW KNEE;  Surgeon:  Newt Minion, MD;  Location: [place];  Service: Orthopedics;  Laterality: Bilateral;  Bilateral Below Knee Amputations . AMPUTATION Bilateral 09/03/2013  Procedure: AMPUTATION BELOW KNEE;  Surgeon: Newt Minion, MD;  Location: [place];  Service: Orthopedics;  Laterality: Bilateral;  Bilateral Below Knee Amputation Revision . AV FISTULA PLACEMENT  06/07/2011  Procedure: ARTERIOVENOUS (AV) FISTULA CREATION;  Surgeon: Angelia Mould, MD;  Location: [place];  Service: Vascular;  Laterality: Left; . AV FISTULA PLACEMENT  06/18/2012  Procedure: ARTERIOVENOUS (AV) FISTULA CREATION;  Surgeon: Mal Misty, MD;  Location: [place];  Service: Vascular;  Laterality: Right;  [place] . BILIARY DIVERSION, EXTERNAL  2002 . BONE MARROW BIOPSY  2012 . CAPD INSERTION N/A 01/14/2013  Procedure: LAPAROSCOPIC INSERTION CONTINUOUS AMBULATORY PERITONEAL DIALYSIS  (CAPD) CATHETER;  Surgeon: Adin Hector, MD;  Location: [place];  Service: General;  Laterality: N/A; . CAPD REMOVAL N/A 07/09/2013  Procedure: CONTINUOUS AMBULATORY PERITONEAL DIALYSIS  (CAPD) CATHETER REMOVAL;  Surgeon: Adin Hector, MD;  Location: [place];  Service: General;  Laterality: N/A; . COLONOSCOPY    Hx: of . INSERTION OF DIALYSIS CATHETER  05/22/2012  Procedure: INSERTION OF DIALYSIS CATHETER;  Surgeon: Pryor Ochoa, MD;  Location: [facility] OR;  Service: Vascular;  Laterality: N/A;  right internal jugular vein . IR ANGIO INTRA EXTRACRAN SEL COM CAROTID INNOMINATE BILAT MOD SED  10/28/2018 . IR ANGIO VERTEBRAL SEL SUBCLAVIAN INNOMINATE UNI R MOD SED  10/28/2018 . IR ANGIO VERTEBRAL SEL VERTEBRAL UNI L MOD SED  10/28/2018 . LIGATION OF ARTERIOVENOUS  FISTULA  05/22/2012  Procedure: LIGATION OF ARTERIOVENOUS  FISTULA;  Surgeon: Pryor Ochoa, MD;  Location: [facility] OR;  Service: Vascular;  Laterality: Left;  Left brachio-cephalic fistula . LOWER EXTREMITY ANGIOGRAM N/A 06/14/2013  Procedure: LOWER EXTREMITY ANGIOGRAM;  Surgeon: Chuck Hint, MD;  Location: [facility] CATH LAB;  Service:  Cardiovascular;  Laterality: N/A; . NEPHRECTOMY TRANSPLANTED ORGAN   . OTHER SURGICAL HISTORY    Cyst removed from back . PORTACATH PLACEMENT  2002 . RADIOLOGY WITH ANESTHESIA N/A 10/28/2018  Procedure: IR WITH ANESTHESIA;  Surgeon: Radiologist, Medication, MD;  Location: [facility] OR;  Service: Radiology;  Laterality: N/A; . REMOVAL OF A DIALYSIS CATHETER   . RENAL BIOPSY, PERCUTANEOUS  2012 . STUMP REVISION Right 01/12/2020  Procedure: REVISION RIGHT BELOW KNEE AMPUTATION;  Surgeon: Nadara Mustard, MD;  Location: [facility] OR;  Service: Orthopedics;  Laterality: Right; . TONSILLECTOMY   . VASCULAR SURGERY   . WHIPPLE PROCEDURE  2002  duodenal ca, Dr. Gerrit Friends HPI: Paul [name] is a 65 y.o. male with medical history significant of of pAF (recurrence 12/2019 in setting of septic shock), PAD, ESRD with failed renal transplant on HD TuThSat,  DM, diastolicCVA who presents to the ED via EMS with a chief complaint of missed dialysis, COVID-positive. Patient reports being diagnosed with Covid 19 x a few days ago. Symptoms have been kept under control at home until he felt weaker today. Reports overall malaise along with shortness of breath. He is on dialysis [facility] but missed yesterday due to "bad weather".  No data recorded Assessment / Plan / Recommendation CHL IP CLINICAL IMPRESSIONS 06/13/2020 Clinical Impression Pt presents with oropharyngeal dysphagia characterized by reduced bolus cohesion, reduced lingual retraction, reduced pharyngeal constriction, an inconsistent pharyngeal delay, and reduced anterior laryngeal movement. He demonstrated moderate vallecular residue, mild pyriform sinus residue, and mild posterior pharyngeal wall residue. Amount of residue increased with bolus size. Residue was reduced, but not eliminated with secondary swallows or a liquid wash. A single instance of aspiration (PAS 7) was noted before deglutition due to premature spillage and a pharyngeal delay. Aspiration was sensed, but cough was weak and  ineffective. A chin tuck posture was effective in reducing premature spillage and in reducing pharyngeal residue. It is recommended that the pt's current diet of dysphagia 2 solids and nectar thick liquids be continued with observance of swallowing precautions. There is potential for diet advancement to include thin liquids with consistent use of a chin tuck, but SLP will follow for dysphagia treatment and trial this more in sessions. SLP Visit Diagnosis Dysphagia, pharyngeal phase (R13.13) Attention and concentration deficit following -- Frontal lobe and executive function deficit following -- Impact on safety and function Moderate aspiration risk   CHL IP TREATMENT RECOMMENDATION 06/13/2020 Treatment Recommendations Therapy as outlined in treatment plan below   Prognosis 06/13/2020 Prognosis for Safe Diet Advancement Good Barriers to Reach Goals -- Barriers/Prognosis Comment -- CHL IP DIET RECOMMENDATION 06/13/2020 SLP Diet Recommendations Nectar thick liquid;Dysphagia 2 (Fine chop)  solids Liquid Administration via Cup;Straw Medication Administration Whole meds with liquid Compensations Slow rate;Small sips/bites;Chin tuck Postural Changes Seated upright at 90 degrees   CHL IP OTHER RECOMMENDATIONS 06/13/2020 Recommended Consults -- Oral Care Recommendations Oral care BID Other Recommendations Order thickener from pharmacy;Have oral suction available   CHL IP FOLLOW UP RECOMMENDATIONS 06/13/2020 Follow up Recommendations Skilled Nursing facility   [facility] IP FREQUENCY AND DURATION 06/13/2020 Speech Therapy Frequency (ACUTE ONLY) min 2x/week Treatment Duration 2 weeks      CHL IP ORAL PHASE 06/13/2020 Oral Phase Impaired Oral - Pudding Teaspoon -- Oral - Pudding Cup -- Oral - Honey Teaspoon -- Oral - Honey Cup -- Oral - Nectar Teaspoon -- Oral - Nectar Cup Decreased bolus cohesion;Premature spillage Oral - Nectar Straw Decreased bolus cohesion;Premature spillage Oral - Thin Teaspoon -- Oral - Thin Cup Premature  spillage;Decreased bolus cohesion Oral - Thin Straw -- Oral - Puree WFL Oral - Mech Soft -- Oral - Regular WFL Oral - Multi-Consistency -- Oral - Pill WFL Oral Phase - Comment --  CHL IP PHARYNGEAL PHASE 06/13/2020 Pharyngeal Phase Impaired Pharyngeal- Pudding Teaspoon -- Pharyngeal -- Pharyngeal- Pudding Cup -- Pharyngeal -- Pharyngeal- Honey Teaspoon -- Pharyngeal -- Pharyngeal- Honey Cup -- Pharyngeal -- Pharyngeal- Nectar Teaspoon -- Pharyngeal -- Pharyngeal- Nectar Cup Pharyngeal residue - valleculae;Pharyngeal residue - pyriform;Pharyngeal residue - posterior pharnyx;Reduced anterior laryngeal mobility;Reduced pharyngeal peristalsis;Reduced tongue base retraction Pharyngeal -- Pharyngeal- Nectar Straw Pharyngeal residue - valleculae;Pharyngeal residue - pyriform;Pharyngeal residue - posterior pharnyx;Reduced anterior laryngeal mobility;Reduced pharyngeal peristalsis;Reduced tongue base retraction Pharyngeal -- Pharyngeal- Thin Teaspoon -- Pharyngeal -- Pharyngeal- Thin Cup Pharyngeal residue - valleculae;Pharyngeal residue - pyriform;Pharyngeal residue - posterior pharnyx;Reduced anterior laryngeal mobility;Reduced pharyngeal peristalsis;Reduced tongue base retraction Pharyngeal -- Pharyngeal- Thin Straw Pharyngeal residue - valleculae;Pharyngeal residue - pyriform;Pharyngeal residue - posterior pharnyx;Reduced anterior laryngeal mobility;Reduced pharyngeal peristalsis;Reduced tongue base retraction;Penetration/Aspiration before swallow Pharyngeal Material enters airway, passes BELOW cords and not ejected out despite cough attempt by patient Pharyngeal- Puree Pharyngeal residue - valleculae;Pharyngeal residue - pyriform;Pharyngeal residue - posterior pharnyx;Reduced anterior laryngeal mobility;Reduced pharyngeal peristalsis;Reduced tongue base retraction Pharyngeal -- Pharyngeal- Mechanical Soft -- Pharyngeal -- Pharyngeal- Regular Pharyngeal residue - valleculae;Pharyngeal residue - pyriform;Pharyngeal residue  - posterior pharnyx;Reduced anterior laryngeal mobility;Reduced pharyngeal peristalsis;Reduced tongue base retraction Pharyngeal -- Pharyngeal- Multi-consistency -- Pharyngeal -- Pharyngeal- Pill -- Pharyngeal -- Pharyngeal Comment --  CHL IP CERVICAL ESOPHAGEAL PHASE 06/13/2020 Cervical Esophageal Phase WFL Pudding Teaspoon -- Pudding Cup -- Honey Teaspoon -- Honey Cup -- Nectar Teaspoon -- Nectar Cup -- Nectar Straw -- Thin Teaspoon -- Thin Cup -- Thin Straw -- Puree -- Mechanical Soft -- Regular -- Multi-consistency -- Pill -- Cervical Esophageal Comment -- Shanika I. Hardin Negus, [place], [place] Office number [phone] Pager [place] 06/13/2020, 5:09 PM              ECHOCARDIOGRAM LIMITED  Result Date: 06/13/2020    ECHOCARDIOGRAM LIMITED REPORT   Patient Name:   Paul [name] Date of Exam: 06/13/2020 Medical Rec #:  [id number]     Height:       72.0 in Accession #:    [id number]    Weight:       160.7 lb Date of Birth:  1955/12/07     BSA:          1.941 m Patient Age:    78 years      BP:           145/81 mmHg Patient Gender:  M             HR:           47 bpm. Exam Location:  Inpatient Procedure: Limited Echo, Limited Color Doppler and Cardiac Doppler Indications:    Congestive heart failure  History:        Patient has prior history of Echocardiogram examinations, most                 recent 01/28/2019. CHF, Covid. End stage renal disease,                 Arrythmias:Paroxysmal [id number], Signs/Symptoms:Bacteremia; Risk                 Factors:Diabetes.  Sonographer:    Johny Chess Referring Phys: Cherry Log  1. Limited study to assess LV function; not all views obtained.  2. Left ventricular ejection fraction, by estimation, is 25 to 30%. The left ventricle has severely decreased function. The left ventricle demonstrates global hypokinesis. Left ventricular diastolic parameters are consistent with Grade II diastolic dysfunction  (pseudonormalization).  3. Right ventricular systolic function is mildly reduced. The right ventricular size is normal. There is normal pulmonary artery systolic pressure.  4. Left atrial size was moderately dilated.  5. Right atrial size was mildly dilated.  6. Moderate pleural effusion in the left lateral region.  7. The mitral valve is normal in structure. Mild mitral valve regurgitation. No evidence of mitral stenosis.  8. The aortic valve is tricuspid. Aortic valve regurgitation is mild. Mild to moderate aortic valve sclerosis/calcification is present, without any evidence of aortic stenosis.  9. The inferior vena cava is normal in size with greater than 50% respiratory variability, suggesting right atrial pressure of 3 mmHg. FINDINGS  Left Ventricle: Left ventricular ejection fraction, by estimation, is 25 to 30%. The left ventricle has severely decreased function. The left ventricle demonstrates global hypokinesis. The left ventricular internal cavity size was normal in size. There is no left ventricular hypertrophy. Left ventricular diastolic parameters are consistent with Grade II diastolic dysfunction (pseudonormalization). Right Ventricle: The right ventricular size is normal. No increase in right ventricular wall thickness. Right ventricular systolic function is mildly reduced. There is normal pulmonary artery systolic pressure. The tricuspid regurgitant velocity is 2.38 m/s, and with an assumed right atrial pressure of 3 mmHg, the estimated right ventricular systolic pressure is 01.5 mmHg. Left Atrium: Left atrial size was moderately dilated. Right Atrium: Right atrial size was mildly dilated. Pericardium: There is no evidence of pericardial effusion. Mitral Valve: The mitral valve is normal in structure. Mild mitral annular calcification. Mild mitral valve regurgitation. No evidence of mitral valve stenosis. Tricuspid Valve: The tricuspid valve is normal in structure. Tricuspid valve regurgitation is  trivial. No evidence of tricuspid stenosis. Aortic Valve: The aortic valve is tricuspid. Aortic valve regurgitation is mild. Mild to moderate aortic valve sclerosis/calcification is present, without any evidence of aortic stenosis. Pulmonic Valve: The pulmonic valve was not well visualized. Pulmonic valve regurgitation is not visualized. Aorta: The aortic root is normal in size and structure. Venous: The inferior vena cava is normal in size with greater than 50% respiratory variability, suggesting right atrial pressure of 3 mmHg.  Additional Comments: Limited study to assess LV function; not all views obtained. There is a moderate pleural effusion in the left lateral region. LEFT VENTRICLE PLAX 2D LVIDd:         5.30 cm  Diastology LVIDs:  4.50 cm  LV e' medial:    4.46 cm/s LV PW:         1.10 cm  LV E/e' medial:  14.5 LV IVS:        1.00 cm  LV e' lateral:   5.77 cm/s LVOT diam:     2.40 cm  LV E/e' lateral: 11.2 LV SV:         72 LV SV Index:   37 LVOT Area:     4.52 cm  LEFT ATRIUM         Index LA diam:    5.10 cm 2.63 cm/m  AORTIC VALVE LVOT Vmax:   65.90 cm/s LVOT Vmean:  43.400 cm/s LVOT VTI:    0.159 m  AORTA Ao Asc diam: 3.60 cm MITRAL VALVE               TRICUSPID VALVE MV Area (PHT): 2.24 cm    TR Peak grad:   22.7 mmHg MV Decel Time: 338 msec    TR Vmax:        238.00 cm/s MV E velocity: 64.70 cm/s MV A velocity: 28.70 cm/s  SHUNTS MV E/A ratio:  2.25        Systemic VTI:  0.16 m                            Systemic Diam: 2.40 cm Kirk Ruths MD Electronically signed by Kirk Ruths MD Signature Date/Time: 06/13/2020/6:50:33 PM    Final         Scheduled Meds: . aspirin  81 mg Oral Daily  . atorvastatin  10 mg Oral Q M,W,F  . bisacodyl  10 mg Oral Daily  . [START ON 06/15/2020] Darbepoetin Alfa  60 mcg Intravenous Q Thu-HD  . docusate sodium  100 mg Oral BID  . hydrALAZINE  10 mg Oral TID  . insulin aspart  0-20 Units Subcutaneous TID WC  . insulin glargine  10 Units  Subcutaneous Daily  . isosorbide mononitrate  15 mg Oral Daily  . lipase/protease/amylase  12,000 Units Oral TID AC  . magnesium chloride  2 tablet Oral BID  . pantoprazole  80 mg Oral Daily  . polyethylene glycol  17 g Oral Daily  . predniSONE  50 mg Oral Daily  . sertraline  50 mg Oral QHS  . sodium chloride flush  3 mL Intravenous Q12H  . tacrolimus  2 mg Oral BID  . tamsulosin  0.4 mg Oral QPC supper   Continuous Infusions: . sodium chloride    . heparin 1,600 Units/hr (06/14/20 1725)  . remdesivir 100 mg in NS 100 mL 100 mg (06/14/20 0838)  . vancomycin 750 mg (06/13/20 1118)     LOS: 3 days   Time spent= 35 mins    Berle Mull, MD [facility]  If 7PM-7AM, please contact night-coverage  06/14/2020, 8:21 PM

## 2020-06-14 NOTE — Consult Note (Signed)
Cardiology Consultation:   Due to the COVID-19 pandemic, this visit was completed with telemedicine (audio/video) technology to reduce patient and provider exposure as well as to preserve personal protective equipment.   Patient ID: Paul [name] MRN: [id number]; DOB: [birth year]  Admit date: 06/11/2020 Date of Consult: 06/14/2020  Primary Care Provider: [name], MD Primary Cardiologist: Evalina Field, MD  Primary Electrophysiologist:  None    Patient Profile:   Paul [name] is a 65 y.o. male with a history of paroxysmal atrial fibrillation on Eliquis, PAD s/p bilateral BKA, mild aortic stenosis on Echo in 01/2019, CVA, hypertension, type 1 diabetes mellitus, ESRD with failed renal transplant on hemodialysis on T/Th/Sat, and duodenal cancer s/p resection who is being seen today for the evaluation of atrial fibrillation at the request of Dr. Posey Pronto.  History of Present Illness:   Paul [name] is a 65 year old male the above history who is followed by Dr. Audie Box. Patient was referred to Dr. Audie Box in 12/2018 for further evaluation of atrial fibrillation and 2 hospitalizations for syncope.  Myoview and Zio Monitor were ordered for further evaluation. Myoview in 01/2019 showed mid wall motion abnormalities of anterior and anterolateral walls but normal perfusion in this area. Echo was ordered for further evaluation LVEF of 60-65% with grade 1 diastolic dysfunction. Monitor was never completed. Patient was recently seen by Dr. Audie Box on 05/29/2020 at which time he reported 2-3 weeks of elevated heart rates with dialysis. His Nephrologist started him on Metoprolol. He was seen by his PCP and EKG showed normal sinus rhythm. A Zio monitor was again ordered for further evaluation.  Patient presented to the ED on 06/11/2020 via EMS for further evaluation of weakness and dyspnea. Patient was diagnosed with COVID several days before and O2 sats were reportedly in the 80's when the Fire Department arrived.  He had also missed a dialysis session the day before.  In the ED, EKG showed atrial fibrillation, rate 134 bpm, with mild ST depression in inferior leads and slight ST elevation in aVR and V1 consistent. Not consistent with STEMI. Chest x-ray showed diffuse interstitial prominence with streaky interstitial opacities within the mid to lower lung fields and small left pleural effusion. BNP elevated at >4,500. WBC 7.5, Hgb 10.6, Plts 106. Na 137, K 3.8, Glucose 158, BUN 66, Cr 6.91. Albumin 2.2. Otherwise, LFTs normal. D-dimer elevated at 2.23. Ferritin, Fibrinogen, and Lactic acid elevated. Procalcitonin normal. Patient was admitted for COVID pneumonia and atrial fibrillation with RVR. Echo showed LVEF of 25-30% with global hypokinesis and grade 2 diastolic dysfunction. Cardiology consulted for assistance with atrial fibrillation and CHF.  Given COVID positive status, called into patient's room. Patient reports intermittent palpitations for the last several weeks but has otherwise been doing well from a cardiac standpoint. No chest pain, shortness of breath, orthopnea, PND, edema, lightheadedness, dizziness, near syncope/syncope. He notes a productive cough with his COVID. No abnormal bleeding in urine or stools. He states he is feeling a little better since being admitted.   Past Medical History:  Diagnosis Date  . A-fib ([place])   . Anemia    esrd  . Calciphylaxis 05/2013   lower ext  . Cancer ([place])    hx duodenal adenoCa 2002, resected  . Closed left arm fracture 1967  . Corneal abrasion, left   . Depression   . Diabetes mellitus    Type 1 iddm x 11 yrs  . Diabetic neuropathy ([place])   . ESRD on hemodialysis ([place])  Pt has ESRD due to DM.  He had a L upper arm AVF prior to starting HD which was ligated due to L arm steal syndrome.  He started HD in Jan 2014 and did home HD.  The family couldn't cannulate the R arm AVF successfully so by mid 2014 they decided to switch to PD which was done late  summer 2014.  In Jan 2015 he was admitted with FTT felt to be due to underdialysis on PD and PD was abandoned and he   . GERD (gastroesophageal reflux disease)   . Glomerulosclerosis, diabetic ([place])   . Heart murmur   . History of blood transfusion   . Hypertension    off of meds due to orthostatic hypotension  . Kidney transplant recipient   . Open wound of both legs with complication    Pt has had progressive wounds of both LE's including gangrene of the toes and patchy necrosis of the calves.  He has been treated at [facility] by Dr Jerline Pain with hyperbaric O2 5d per week.  He is getting Na Thiosulfate with HD for suspected calciphylaxis. Pt says doctor's aren't sure if these ulcers were diabetic ulcers or calciphylaxis.  He underwent bilat BKA on 07/30/13.  He says that the woun    Past Surgical History:  Procedure Laterality Date  . AMPUTATION Bilateral 07/30/2013   Procedure: AMPUTATION BELOW KNEE;  Surgeon: Newt Minion, MD;  Location: [place];  Service: Orthopedics;  Laterality: Bilateral;  Bilateral Below Knee Amputations  . AMPUTATION Bilateral 09/03/2013   Procedure: AMPUTATION BELOW KNEE;  Surgeon: Newt Minion, MD;  Location: [place];  Service: Orthopedics;  Laterality: Bilateral;  Bilateral Below Knee Amputation Revision  . AV FISTULA PLACEMENT  06/07/2011   Procedure: ARTERIOVENOUS (AV) FISTULA CREATION;  Surgeon: Angelia Mould, MD;  Location: [place];  Service: Vascular;  Laterality: Left;  . AV FISTULA PLACEMENT  06/18/2012   Procedure: ARTERIOVENOUS (AV) FISTULA CREATION;  Surgeon: Mal Misty, MD;  Location: [place];  Service: Vascular;  Laterality: Right;  [place]  . BILIARY DIVERSION, EXTERNAL  2002  . BONE MARROW BIOPSY  2012  . CAPD INSERTION N/A 01/14/2013   Procedure: LAPAROSCOPIC INSERTION CONTINUOUS AMBULATORY PERITONEAL DIALYSIS  (CAPD) CATHETER;  Surgeon: Adin Hector, MD;  Location: [place];  Service: General;  Laterality: N/A;  . CAPD REMOVAL N/A 07/09/2013    Procedure: CONTINUOUS AMBULATORY PERITONEAL DIALYSIS  (CAPD) CATHETER REMOVAL;  Surgeon: Adin Hector, MD;  Location: [place];  Service: General;  Laterality: N/A;  . COLONOSCOPY     Hx: of  . INSERTION OF DIALYSIS CATHETER  05/22/2012   Procedure: INSERTION OF DIALYSIS CATHETER;  Surgeon: Mal Misty, MD;  Location: [place];  Service: Vascular;  Laterality: N/A;  right internal jugular vein  . IR ANGIO INTRA EXTRACRAN SEL COM CAROTID INNOMINATE BILAT MOD SED  10/28/2018  . IR ANGIO VERTEBRAL SEL SUBCLAVIAN INNOMINATE UNI R MOD SED  10/28/2018  . IR ANGIO VERTEBRAL SEL VERTEBRAL UNI L MOD SED  10/28/2018  . LIGATION OF ARTERIOVENOUS  FISTULA  05/22/2012   Procedure: LIGATION OF ARTERIOVENOUS  FISTULA;  Surgeon: Mal Misty, MD;  Location: [place];  Service: Vascular;  Laterality: Left;  Left brachio-cephalic fistula  . LOWER EXTREMITY ANGIOGRAM N/A 06/14/2013   Procedure: LOWER EXTREMITY ANGIOGRAM;  Surgeon: Angelia Mould, MD;  Location: [facility] CATH LAB;  Service: Cardiovascular;  Laterality: N/A;  . NEPHRECTOMY TRANSPLANTED ORGAN    .  OTHER SURGICAL HISTORY     Cyst removed from back  . PORTACATH PLACEMENT  2002  . RADIOLOGY WITH ANESTHESIA N/A 10/28/2018   Procedure: IR WITH ANESTHESIA;  Surgeon: Radiologist, Medication, MD;  Location: [place];  Service: Radiology;  Laterality: N/A;  . REMOVAL OF A DIALYSIS CATHETER    . RENAL BIOPSY, PERCUTANEOUS  2012  . STUMP REVISION Right 01/12/2020   Procedure: REVISION RIGHT BELOW KNEE AMPUTATION;  Surgeon: Newt Minion, MD;  Location: [place];  Service: Orthopedics;  Laterality: Right;  . TONSILLECTOMY    . VASCULAR SURGERY    . WHIPPLE PROCEDURE  2002   duodenal ca, Dr. Harlow Asa     Home Medications:  Prior to Admission medications   Medication Sig Start Date End Date Taking? Authorizing Provider  apixaban (ELIQUIS) 5 MG TABS tablet Take 1 tablet (5 mg total) by mouth 2 (two) times daily. 01/31/20  Yes Angiulli, Lavon Paganini, PA-C  aspirin 81 MG  chewable tablet Chew 81 mg by mouth daily.   Yes [provider]  atorvastatin (LIPITOR) 10 MG tablet Take 1 tablet (10 mg total) by mouth every Monday, Wednesday, and Friday. 01/31/20  Yes Angiulli, Lavon Paganini, PA-C  [employer] D3 50 MCG (2000 UT) CAPS Take 2 capsules (4,000 Units total) by mouth daily. 01/31/20  Yes Angiulli, Lavon Paganini, PA-C  Insulin Glargine (BASAGLAR KWIKPEN) 100 UNIT/ML Inject 8 Units into the skin daily. 01/31/20  Yes Angiulli, Lavon Paganini, PA-C  insulin lispro (HUMALOG KWIKPEN) 100 UNIT/ML KwikPen Inject 2-3 Units into the skin 3 (three) times daily before meals.   Yes [provider]  lipase/protease/amylase (CREON) 12000-38000 units CPEP capsule Take 1 capsule (12,000 Units total) by mouth 3 (three) times daily before meals. 01/31/20  Yes Angiulli, Lavon Paganini, PA-C  magnesium chloride (SLOW-MAG) 64 MG TBEC SR tablet Take 2 tablets (128 mg total) by mouth 2 (two) times daily. Take at least 2 hours separate from tacrolimus. 01/31/20  Yes Angiulli, Lavon Paganini, PA-C  metoprolol tartrate (LOPRESSOR) 25 MG tablet Take 1 tablet (25 mg total) by mouth 2 (two) times daily. Patient taking differently: Take 25 mg by mouth daily. 05/29/20  Yes O'Neal, Cassie Freer, MD  omeprazole (PRILOSEC) 40 MG capsule Take 1 capsule (40 mg total) by mouth at bedtime. 01/31/20  Yes Angiulli, Lavon Paganini, PA-C  predniSONE (DELTASONE) 5 MG tablet Take 1 tablet (5 mg total) by mouth daily with breakfast. 01/31/20  Yes Angiulli, Lavon Paganini, PA-C  sertraline (ZOLOFT) 50 MG tablet Take 1 tablet (50 mg total) by mouth at bedtime. Patient taking differently: Take 50 mg by mouth daily. 01/31/20  Yes Angiulli, Lavon Paganini, PA-C  sodium bicarbonate 650 MG tablet Take 1,950 mg by mouth 4 (four) times daily.   Yes [provider]  tacrolimus (PROGRAF) 1 MG capsule Take 2 capsules (2 mg total) by mouth 2 (two) times daily. 01/31/20  Yes Angiulli, Lavon Paganini, PA-C  tamsulosin (FLOMAX) 0.4 MG CAPS capsule Take 1 capsule (0.4  mg total) by mouth daily after supper. 01/31/20  Yes Angiulli, Lavon Paganini, PA-C  acetaminophen (TYLENOL) 325 MG tablet Take 2 tablets (650 mg total) by mouth every 6 (six) hours as needed for mild pain (or Fever >/= 101). Patient not taking: No sig reported 01/18/20   Georgette Shell, MD  amLODipine (NORVASC) 10 MG tablet Take 1 tablet (10 mg total) by mouth daily. Patient not taking: Reported on 06/11/2020 05/29/20   Geralynn Rile, MD  bisacodyl (DULCOLAX) 5  MG EC tablet Take 2 tablets (10 mg total) by mouth daily. Patient not taking: No sig reported 01/19/20   Georgette Shell, MD  Darbepoetin Alfa (ARANESP) 60 MCG/0.3ML SOSY injection Inject 0.3 mLs (60 mcg total) into the vein every Thursday with hemodialysis. 02/03/20   Angiulli, Lavon Paganini, PA-C  docusate sodium (COLACE) 100 MG capsule Take 1 capsule (100 mg total) by mouth 2 (two) times daily. Patient not taking: No sig reported 01/18/20   Georgette Shell, MD  mycophenolate (MYFORTIC) 180 MG EC tablet Take 2 tablets (360 mg total) by mouth 2 (two) times daily. Patient not taking: Reported on 06/11/2020 01/31/20   Angiulli, Lavon Paganini, PA-C  polyethylene glycol (MIRALAX / GLYCOLAX) 17 g packet Take 17 g by mouth daily. Patient not taking: Reported on 06/11/2020 01/19/20   Georgette Shell, MD    Inpatient Medications: Scheduled Meds: . aspirin  81 mg Oral Daily  . atorvastatin  10 mg Oral Q M,W,F  . bisacodyl  10 mg Oral Daily  . [START ON 06/15/2020] Darbepoetin Alfa  60 mcg Intravenous Q Thu-HD  . docusate sodium  100 mg Oral BID  . insulin aspart  0-20 Units Subcutaneous TID WC  . insulin glargine  10 Units Subcutaneous Daily  . lipase/protease/amylase  12,000 Units Oral TID AC  . magnesium chloride  2 tablet Oral BID  . pantoprazole  80 mg Oral Daily  . polyethylene glycol  17 g Oral Daily  . predniSONE  50 mg Oral Daily  . sertraline  50 mg Oral QHS  . sodium chloride flush  3 mL Intravenous Q12H  . tacrolimus  2 mg  Oral BID  . tamsulosin  0.4 mg Oral QPC supper   Continuous Infusions: . sodium chloride    . heparin 1,600 Units/hr (06/14/20 0342)  . remdesivir 100 mg in NS 100 mL 100 mg (06/14/20 0838)  . vancomycin 750 mg (06/13/20 1118)   PRN Meds: sodium chloride, acetaminophen, chlorpheniramine-HYDROcodone, guaiFENesin-dextromethorphan, Ipratropium-Albuterol, metoprolol tartrate, Resource ThickenUp Clear, sodium chloride flush, traMADol  Allergies:    Allergies  Allergen Reactions  . Metformin And Related Other (See Comments)    Has kidney problems    Social History:   Social History   Socioeconomic History  . Marital status: Married    Spouse name: Not on file  . Number of children: Not on file  . Years of education: Not on file  . Highest education level: Not on file  Occupational History  . Not on file  Tobacco Use  . Smoking status: Never Smoker  . Smokeless tobacco: Never Used  Vaping Use  . Vaping Use: Never used  Substance and Sexual Activity  . Alcohol use: No  . Drug use: No  . Sexual activity: Not on file  Other Topics Concern  . Not on file  Social History Narrative  . Not on file   Social Determinants of Health   Financial Resource Strain: Not on file  Food Insecurity: Not on file  Transportation Needs: Not on file  Physical Activity: Not on file  Stress: Not on file  Social Connections: Not on file  Intimate Partner Violence: Not on file    Family History:   Family History  Problem Relation Age of Onset  . Cancer Mother   . Cancer Father        prostate cancer  . Heart disease Maternal Grandfather   . Stroke Paternal Grandmother      ROS:  Please see  the history of present illness.  All other ROS reviewed and negative.     Physical Exam/Data:   Vitals:   06/13/20 1056 06/13/20 2007 06/14/20 0358 06/14/20 1056  BP: 132/69 116/71 125/73 120/69  Pulse: (!) 52 (!) 46  (!) 59  Resp: $Remo'18 17  17  'LwPvm$ Temp: (!) 96.7 F (35.9 C) 98 F (36.7 C)  97.9  F (36.6 C)  TempSrc: Oral Oral    SpO2: 100% 98% 100% 92%  Weight:      Height:        Intake/Output Summary (Last 24 hours) at 06/14/2020 1624 Last data filed at 06/14/2020 1300 Gross per 24 hour  Intake 480 ml  Output --  Net 480 ml   Last 3 Weights 06/11/2020 05/29/2020 03/10/2020  Weight (lbs) 160 lb 11.5 oz 160 lb 12.8 oz 145 lb  Weight (kg) 72.9 kg 72.938 kg 65.772 kg     Body mass index is 21.8 kg/m.   Vital Signs: Reviewed General: 65 y.o. male no acute distress. Heart: Bradycardic with occasional ectopy on telemetry.  Lungs: Able to talk in complete sentences without any labored breathing. No cough or audible wheeze. Neuro: Alert and oriented x3.  Psych: Normal affect. Responds appropriately.  EKG:  The EKG was personally reviewed and demonstrates: Atrial fibrillation, rate 134 bpm, with mild ST depression in inferior leads and slight ST elevation in aVR and V1 consistent. Not consistent with STEMI.  Telemetry:  Telemetry was personally reviewed and demonstrates: Sinus bradycardia with PVCs. Rate in the 40's to 50's.  Relevant CV Studies: Echocardiogram 06/13/2020: Impression: 1. Limited study to assess LV function; not all views obtained.  2. Left ventricular ejection fraction, by estimation, is 25 to 30%. The  left ventricle has severely decreased function. The left ventricle  demonstrates global hypokinesis. Left ventricular diastolic parameters are  consistent with Grade II diastolic  dysfunction (pseudonormalization).  3. Right ventricular systolic function is mildly reduced. The right  ventricular size is normal. There is normal pulmonary artery systolic  pressure.  4. Left atrial size was moderately dilated.  5. Right atrial size was mildly dilated.  6. Moderate pleural effusion in the left lateral region.  7. The mitral valve is normal in structure. Mild mitral valve  regurgitation. No evidence of mitral stenosis.  8. The aortic valve is  tricuspid. Aortic valve regurgitation is mild.  Mild to moderate aortic valve sclerosis/calcification is present, without  any evidence of aortic stenosis.  9. The inferior vena cava is normal in size with greater than 50%  respiratory variability, suggesting right atrial pressure of 3 mmHg.  Laboratory Data:  Chemistry Recent Labs  Lab 06/12/20 0907 06/13/20 0500 06/14/20 0447  NA 140 137 138  K 3.9 3.8 3.2*  CL 103 98 100  CO2 22 20* 21*  GLUCOSE 271* 158* 164*  BUN 49* 66* 58*  CREATININE 6.37* 6.91* 5.47*  CALCIUM 7.9* 7.6* 7.3*  GFRNONAA 9* 8* 11*  ANIONGAP 15 19* 17*    Recent Labs  Lab 06/12/20 0907 06/13/20 0500 06/14/20 0447  PROT 5.5* 5.3* 5.0*  ALBUMIN 2.2* 2.2* 2.1*  AST $Re'17 15 23  'Dvy$ ALT $R'11 8 13  'lF$ ALKPHOS 70 71 71  BILITOT 1.0 0.8 1.1   Hematology Recent Labs  Lab 06/12/20 0907 06/12/20 1026 06/13/20 0500 06/14/20 0447  WBC 4.4  --  7.5 8.3  RBC 4.07*  --  3.98* 4.22  HGB 11.2*  --  10.6* 11.6*  HCT 36.6* 35.3* 36.2* 37.0*  MCV 89.9  --  91.0 87.7  MCH 27.5  --  26.6 27.5  MCHC 30.6  --  29.3* 31.4  RDW 17.7*  --  17.6* 17.6*  PLT 85*  --  106* 102*   Cardiac EnzymesNo results for input(s): TROPONINI in the last 168 hours. No results for input(s): TROPIPOC in the last 168 hours.  BNP Recent Labs  Lab 06/11/20 1326  BNP >4,500.0*    DDimer  Recent Labs  Lab 06/11/20 1437 06/13/20 0803 06/14/20 0447  DDIMER 2.23* 2.87* 2.38*    Radiology/Studies:  DG Chest 1 View  Result Date: 06/11/2020 CLINICAL DATA:  Shortness of breath, COVID-19 positive EXAM: CHEST  1 VIEW COMPARISON:  01/04/2020 FINDINGS: Mild cardiomegaly. Atherosclerotic calcification of the aortic knob. Diffuse interstitial prominence with streaky interstitial opacities most pronounced within the mid to lower lung fields. Small left pleural effusion. No pneumothorax. IMPRESSION: Diffuse interstitial prominence with streaky interstitial opacities within the mid to lower lung  fields and small left pleural effusion. Findings may reflect multifocal atypical/viral infection versus pulmonary edema. Electronically Signed   By: Davina Poke D.O.   On: 06/11/2020 14:15   CT HEAD WO CONTRAST  Result Date: 06/12/2020 CLINICAL DATA:  Altered mental status EXAM: CT HEAD WITHOUT CONTRAST TECHNIQUE: Contiguous axial images were obtained from the base of the skull through the vertex without intravenous contrast. COMPARISON:  MRI 10/29/2018, CT FINDINGS: Brain: Mild parenchymal volume loss is present, commensurate with the patient's age. Mild periventricular white matter changes are present likely reflecting the sequela of small vessel ischemia, stable since prior examination. Tiny remote lacunar infarcts developed within the left cerebellar hemisphere, new since prior examination. No acute intracranial hemorrhage or infarct. No abnormal mass effect or midline shift. No abnormal intra or extra-axial mass lesion or fluid collection. Ventricular size is normal. Vascular: No asymmetric hyperdense vasculature at the skull base. Moderate atherosclerotic calcification within the carotid siphons and distal vertebral arteries. Skull: Intact Sinuses/Orbits: There is complete opacification of the left maxillary sinus, left ethmoid air cells, and left frontal sinus. Moderate mucosal thickening within the right sphenoid sinus. The orbits are unremarkable. Other: The mastoid air cells and middle ear cavities are clear. IMPRESSION: No evidence of acute intracranial hemorrhage or infarct. Interval development of tiny remote lacunar infarct within the left cerebellar hemisphere. Mild senescent change, stable since prior examination. Extensive left-sided paranasal sinus disease. Electronically Signed   By: Fidela Salisbury MD   On: 06/12/2020 01:40   DG Swallowing Func-Speech Pathology  Result Date: 06/13/2020 Objective Swallowing Evaluation: Type of Study: MBS-Modified Barium Swallow Study  Patient Details  Name: Paul [name] MRN: [id number] Date of Birth: 14-Jun-1955 Today's Date: 06/13/2020 Time: SLP Start Time (ACUTE ONLY): 1600 -SLP Stop Time (ACUTE ONLY): 1620 SLP Time Calculation (min) (ACUTE ONLY): 20 min Past Medical History: Past Medical History: Diagnosis Date . A-fib ([place])  . Anemia   esrd . Calciphylaxis 05/2013  lower ext . Cancer ([place])   hx duodenal adenoCa 2002, resected . Closed left arm fracture 1967 . Corneal abrasion, left  . Depression  . Diabetes mellitus   Type 1 iddm x 11 yrs . Diabetic neuropathy ([place])  . ESRD on hemodialysis ([place])   Pt has ESRD due to DM.  He had a L upper arm AVF prior to starting HD which was ligated due to L arm steal syndrome.  He started HD in Jan 2014 and did home HD.  The family couldn't cannulate the R arm AVF successfully  so by mid 2014 they decided to switch to PD which was done late summer 2014.  In Jan 2015 he was admitted with FTT felt to be due to underdialysis on PD and PD was abandoned and he  . GERD (gastroesophageal reflux disease)  . Glomerulosclerosis, diabetic ([place])  . Heart murmur  . History of blood transfusion  . Hypertension   off of meds due to orthostatic hypotension . Kidney transplant recipient  . Open wound of both legs with complication   Pt has had progressive wounds of both LE's including gangrene of the toes and patchy necrosis of the calves.  He has been treated at [facility] by Dr Jerline Pain with hyperbaric O2 5d per week.  He is getting Na Thiosulfate with HD for suspected calciphylaxis. Pt says doctor's aren't sure if these ulcers were diabetic ulcers or calciphylaxis.  He underwent bilat BKA on 07/30/13.  He says that the woun Past Surgical History: Past Surgical History: Procedure Laterality Date . AMPUTATION Bilateral 07/30/2013  Procedure: AMPUTATION BELOW KNEE;  Surgeon: Newt Minion, MD;  Location: [place];  Service: Orthopedics;  Laterality: Bilateral;  Bilateral Below Knee Amputations . AMPUTATION Bilateral 09/03/2013  Procedure: AMPUTATION  BELOW KNEE;  Surgeon: Newt Minion, MD;  Location: [place];  Service: Orthopedics;  Laterality: Bilateral;  Bilateral Below Knee Amputation Revision . AV FISTULA PLACEMENT  06/07/2011  Procedure: ARTERIOVENOUS (AV) FISTULA CREATION;  Surgeon: Angelia Mould, MD;  Location: [place];  Service: Vascular;  Laterality: Left; . AV FISTULA PLACEMENT  06/18/2012  Procedure: ARTERIOVENOUS (AV) FISTULA CREATION;  Surgeon: Mal Misty, MD;  Location: [place];  Service: Vascular;  Laterality: Right;  [place] . BILIARY DIVERSION, EXTERNAL  2002 . BONE MARROW BIOPSY  2012 . CAPD INSERTION N/A 01/14/2013  Procedure: LAPAROSCOPIC INSERTION CONTINUOUS AMBULATORY PERITONEAL DIALYSIS  (CAPD) CATHETER;  Surgeon: Adin Hector, MD;  Location: [place];  Service: General;  Laterality: N/A; . CAPD REMOVAL N/A 07/09/2013  Procedure: CONTINUOUS AMBULATORY PERITONEAL DIALYSIS  (CAPD) CATHETER REMOVAL;  Surgeon: Adin Hector, MD;  Location: [place];  Service: General;  Laterality: N/A; . COLONOSCOPY    Hx: of . INSERTION OF DIALYSIS CATHETER  05/22/2012  Procedure: INSERTION OF DIALYSIS CATHETER;  Surgeon: Mal Misty, MD;  Location: [place];  Service: Vascular;  Laterality: N/A;  right internal jugular vein . IR ANGIO INTRA EXTRACRAN SEL COM CAROTID INNOMINATE BILAT MOD SED  10/28/2018 . IR ANGIO VERTEBRAL SEL SUBCLAVIAN INNOMINATE UNI R MOD SED  10/28/2018 . IR ANGIO VERTEBRAL SEL VERTEBRAL UNI L MOD SED  10/28/2018 . LIGATION OF ARTERIOVENOUS  FISTULA  05/22/2012  Procedure: LIGATION OF ARTERIOVENOUS  FISTULA;  Surgeon: Mal Misty, MD;  Location: [place];  Service: Vascular;  Laterality: Left;  Left brachio-cephalic fistula . LOWER EXTREMITY ANGIOGRAM N/A 06/14/2013  Procedure: LOWER EXTREMITY ANGIOGRAM;  Surgeon: Angelia Mould, MD;  Location[facility] C[facility] LAB;  Service: Cardiovascular;  Laterality: N/A; . NEPHRECTOMY TRANSPLANTED ORGAN   . OTHER SURGICAL HISTORY    Cyst removed from back . PORTACATH PLACEMENT  2002 . RADIOLOGY WITH  ANESTHESIA N/A 10/28/2018  Procedure: IR WITH ANESTHESIA;  Surgeon: Radiologist, Medication, MD;  Location: [place];  Service: Radiology;  Laterality: N/A; . REMOVAL OF A DIALYSIS CATHETER   . RENAL BIOPSY, PERCUTANEOUS  2012 . STUMP REVISION Right 01/12/2020  Procedure: REVISION RIGHT BELOW KNEE AMPUTATION;  Surgeon: Newt Minion, MD;  Location: [place];  Service: Orthopedics;  Laterality: Right; . TONSILLECTOMY   .  VASCULAR SURGERY   . WHIPPLE PROCEDURE  2002  duodenal ca, Dr. Harlow Asa HPI: Paul [name] is a 65 y.o. male with medical history significant of of pAF (recurrence 12/2019 in setting of septic shock), PAD, ESRD with failed renal transplant on HD TuThSat,  DM, diastolicCVA who presents to the ED via EMS with a chief complaint of missed dialysis, COVID-positive. Patient reports being diagnosed with Covid 19 x a few days ago. Symptoms have been kept under control at home until he felt weaker today. Reports overall malaise along with shortness of breath. He is on dialysis [facility] but missed yesterday due to "bad weather".  No data recorded Assessment / Plan / Recommendation CHL IP CLINICAL IMPRESSIONS 06/13/2020 Clinical Impression Pt presents with oropharyngeal dysphagia characterized by reduced bolus cohesion, reduced lingual retraction, reduced pharyngeal constriction, an inconsistent pharyngeal delay, and reduced anterior laryngeal movement. He demonstrated moderate vallecular residue, mild pyriform sinus residue, and mild posterior pharyngeal wall residue. Amount of residue increased with bolus size. Residue was reduced, but not eliminated with secondary swallows or a liquid wash. A single instance of aspiration (PAS 7) was noted before deglutition due to premature spillage and a pharyngeal delay. Aspiration was sensed, but cough was weak and ineffective. A chin tuck posture was effective in reducing premature spillage and in reducing pharyngeal residue. It is recommended that the pt's current diet of dysphagia 2  solids and nectar thick liquids be continued with observance of swallowing precautions. There is potential for diet advancement to include thin liquids with consistent use of a chin tuck, but SLP will follow for dysphagia treatment and trial this more in sessions. SLP Visit Diagnosis Dysphagia, pharyngeal phase (R13.13) Attention and concentration deficit following -- Frontal lobe and executive function deficit following -- Impact on safety and function Moderate aspiration risk   CHL IP TREATMENT RECOMMENDATION 06/13/2020 Treatment Recommendations Therapy as outlined in treatment plan below   Prognosis 06/13/2020 Prognosis for Safe Diet Advancement Good Barriers to Reach Goals -- Barriers/Prognosis Comment -- CHL IP DIET RECOMMENDATION 06/13/2020 SLP Diet Recommendations Nectar thick liquid;Dysphagia 2 (Fine chop) solids Liquid Administration via Cup;Straw Medication Administration Whole meds with liquid Compensations Slow rate;Small sips/bites;Chin tuck Postural Changes Seated upright at 90 degrees   CHL IP OTHER RECOMMENDATIONS 06/13/2020 Recommended Consults -- Oral Care Recommendations Oral care BID Other Recommendations Order thickener from pharmacy;Have oral suction available   CHL IP FOLLOW UP RECOMMENDATIONS 06/13/2020 Follow up Recommendations Skilled Nursing facility   [facility] IP FREQUENCY AND DURATION 06/13/2020 Speech Therapy Frequency (ACUTE ONLY) min 2x/week Treatment Duration 2 weeks      CHL IP ORAL PHASE 06/13/2020 Oral Phase Impaired Oral - Pudding Teaspoon -- Oral - Pudding Cup -- Oral - Honey Teaspoon -- Oral - Honey Cup -- Oral - Nectar Teaspoon -- Oral - Nectar Cup Decreased bolus cohesion;Premature spillage Oral - Nectar Straw Decreased bolus cohesion;Premature spillage Oral - Thin Teaspoon -- Oral - Thin Cup Premature spillage;Decreased bolus cohesion Oral - Thin Straw -- Oral - Puree WFL Oral - Mech Soft -- Oral - Regular WFL Oral - Multi-Consistency -- Oral - Pill WFL Oral Phase - Comment --  CHL IP  PHARYNGEAL PHASE 06/13/2020 Pharyngeal Phase Impaired Pharyngeal- Pudding Teaspoon -- Pharyngeal -- Pharyngeal- Pudding Cup -- Pharyngeal -- Pharyngeal- Honey Teaspoon -- Pharyngeal -- Pharyngeal- Honey Cup -- Pharyngeal -- Pharyngeal- Nectar Teaspoon -- Pharyngeal -- Pharyngeal- Nectar Cup Pharyngeal residue - valleculae;Pharyngeal residue - pyriform;Pharyngeal residue - posterior pharnyx;Reduced anterior laryngeal mobility;Reduced pharyngeal peristalsis;Reduced tongue base retraction  Pharyngeal -- Pharyngeal- Nectar Straw Pharyngeal residue - valleculae;Pharyngeal residue - pyriform;Pharyngeal residue - posterior pharnyx;Reduced anterior laryngeal mobility;Reduced pharyngeal peristalsis;Reduced tongue base retraction Pharyngeal -- Pharyngeal- Thin Teaspoon -- Pharyngeal -- Pharyngeal- Thin Cup Pharyngeal residue - valleculae;Pharyngeal residue - pyriform;Pharyngeal residue - posterior pharnyx;Reduced anterior laryngeal mobility;Reduced pharyngeal peristalsis;Reduced tongue base retraction Pharyngeal -- Pharyngeal- Thin Straw Pharyngeal residue - valleculae;Pharyngeal residue - pyriform;Pharyngeal residue - posterior pharnyx;Reduced anterior laryngeal mobility;Reduced pharyngeal peristalsis;Reduced tongue base retraction;Penetration/Aspiration before swallow Pharyngeal Material enters airway, passes BELOW cords and not ejected out despite cough attempt by patient Pharyngeal- Puree Pharyngeal residue - valleculae;Pharyngeal residue - pyriform;Pharyngeal residue - posterior pharnyx;Reduced anterior laryngeal mobility;Reduced pharyngeal peristalsis;Reduced tongue base retraction Pharyngeal -- Pharyngeal- Mechanical Soft -- Pharyngeal -- Pharyngeal- Regular Pharyngeal residue - valleculae;Pharyngeal residue - pyriform;Pharyngeal residue - posterior pharnyx;Reduced anterior laryngeal mobility;Reduced pharyngeal peristalsis;Reduced tongue base retraction Pharyngeal -- Pharyngeal- Multi-consistency -- Pharyngeal --  Pharyngeal- Pill -- Pharyngeal -- Pharyngeal Comment --  CHL IP CERVICAL ESOPHAGEAL PHASE 06/13/2020 Cervical Esophageal Phase WFL Pudding Teaspoon -- Pudding Cup -- Honey Teaspoon -- Honey Cup -- Nectar Teaspoon -- Nectar Cup -- Nectar Straw -- Thin Teaspoon -- Thin Cup -- Thin Straw -- Puree -- Mechanical Soft -- Regular -- Multi-consistency -- Pill -- Cervical Esophageal Comment -- Shanika I. Vear Clock, MS, CCC-SLP Acute Rehabilitation Services Office number [phone] Pager [phone] Scheryl Marten 06/13/2020, 5:09 PM              ECHOCARDIOGRAM LIMITED  Result Date: 06/13/2020    ECHOCARDIOGRAM LIMITED REPORT   Patient Name:   Paul [name] Date of Exam: 06/13/2020 Medical Rec #:  [id number]     Height:       72.0 in Accession #:    [id number]    Weight:       160.7 lb Date of Birth:  1955-08-07     BSA:          1.941 m Patient Age:    64 years      BP:           145/81 mmHg Patient Gender: M             HR:           47 bpm. Exam Location:  Inpatient Procedure: Limited Echo, Limited Color Doppler and Cardiac Doppler Indications:    Congestive heart failure  History:        Patient has prior history of Echocardiogram examinations, most                 recent 01/28/2019. CHF, Covid. End stage renal disease,                 Arrythmias:Paroxysmal [id number], Signs/Symptoms:Bacteremia; Risk                 Factors:Diabetes.  Sonographer:    Delcie Roch Referring Phys: [id number] MICHAEL E NORINS IMPRESSIONS  1. Limited study to assess LV function; not all views obtained.  2. Left ventricular ejection fraction, by estimation, is 25 to 30%. The left ventricle has severely decreased function. The left ventricle demonstrates global hypokinesis. Left ventricular diastolic parameters are consistent with Grade II diastolic dysfunction (pseudonormalization).  3. Right ventricular systolic function is mildly reduced. The right ventricular size is normal. There is normal pulmonary artery systolic pressure.  4. Left  atrial size was moderately dilated.  5. Right atrial size was mildly dilated.  6. Moderate pleural effusion in the left lateral region.  7. The mitral valve  is normal in structure. Mild mitral valve regurgitation. No evidence of mitral stenosis.  8. The aortic valve is tricuspid. Aortic valve regurgitation is mild. Mild to moderate aortic valve sclerosis/calcification is present, without any evidence of aortic stenosis.  9. The inferior vena cava is normal in size with greater than 50% respiratory variability, suggesting right atrial pressure of 3 mmHg. FINDINGS  Left Ventricle: Left ventricular ejection fraction, by estimation, is 25 to 30%. The left ventricle has severely decreased function. The left ventricle demonstrates global hypokinesis. The left ventricular internal cavity size was normal in size. There is no left ventricular hypertrophy. Left ventricular diastolic parameters are consistent with Grade II diastolic dysfunction (pseudonormalization). Right Ventricle: The right ventricular size is normal. No increase in right ventricular wall thickness. Right ventricular systolic function is mildly reduced. There is normal pulmonary artery systolic pressure. The tricuspid regurgitant velocity is 2.38 m/s, and with an assumed right atrial pressure of 3 mmHg, the estimated right ventricular systolic pressure is 35.5 mmHg. Left Atrium: Left atrial size was moderately dilated. Right Atrium: Right atrial size was mildly dilated. Pericardium: There is no evidence of pericardial effusion. Mitral Valve: The mitral valve is normal in structure. Mild mitral annular calcification. Mild mitral valve regurgitation. No evidence of mitral valve stenosis. Tricuspid Valve: The tricuspid valve is normal in structure. Tricuspid valve regurgitation is trivial. No evidence of tricuspid stenosis. Aortic Valve: The aortic valve is tricuspid. Aortic valve regurgitation is mild. Mild to moderate aortic valve sclerosis/calcification is  present, without any evidence of aortic stenosis. Pulmonic Valve: The pulmonic valve was not well visualized. Pulmonic valve regurgitation is not visualized. Aorta: The aortic root is normal in size and structure. Venous: The inferior vena cava is normal in size with greater than 50% respiratory variability, suggesting right atrial pressure of 3 mmHg.  Additional Comments: Limited study to assess LV function; not all views obtained. There is a moderate pleural effusion in the left lateral region. LEFT VENTRICLE PLAX 2D LVIDd:         5.30 cm  Diastology LVIDs:         4.50 cm  LV e' medial:    4.46 cm/s LV PW:         1.10 cm  LV E/e' medial:  14.5 LV IVS:        1.00 cm  LV e' lateral:   5.77 cm/s LVOT diam:     2.40 cm  LV E/e' lateral: 11.2 LV SV:         72 LV SV Index:   37 LVOT Area:     4.52 cm  LEFT ATRIUM         Index LA diam:    5.10 cm 2.63 cm/m  AORTIC VALVE LVOT Vmax:   65.90 cm/s LVOT Vmean:  43.400 cm/s LVOT VTI:    0.159 m  AORTA Ao Asc diam: 3.60 cm MITRAL VALVE               TRICUSPID VALVE MV Area (PHT): 2.24 cm    TR Peak grad:   22.7 mmHg MV Decel Time: 338 msec    TR Vmax:        238.00 cm/s MV E velocity: 64.70 cm/s MV A velocity: 28.70 cm/s  SHUNTS MV E/A ratio:  2.25        Systemic VTI:  0.16 m  Systemic Diam: 2.40 cm Kirk Ruths MD Electronically signed by Kirk Ruths MD Signature Date/Time: 06/13/2020/6:50:33 PM    Final      Assessment and Plan:   Paroxysmal Atrial Fibrillation with RVR - Patient admitted with COVID pneumonia after presenting with generalized weakness and hypoxia. Found to be in atrial fibrillation with RVR. - EKG showed atrial fibrillation, rate 134 bpm, with mild ST depression in inferior leads and slight ST elevation in aVR and V1 consistent. Not consistent with STEMI.  - Initially started on IV Cardizem and converted to sinus rhythm with rates currently in the high 40's to 50's. No off Cardizem. - Potassium 3.2 today. Goal  >4.0. Supplement as needed. - Magnesium 2.2 today. Goal 2.0. Supplement as needed. - TSH normal.  - On Lopressor $RemoveBefo'25mg'CdUhNObRGFa$  twice daily at home. Hold beta-blocker for now given bradycardia. Can considering starting Toprol-XL tomorrow if rates have improved. - On Eliquis $RemoveBe'5mg'dxvPhAOSp$  twice daily at home. Currently on hold and on IV Heparin.  Acute on Chronic Combined CHF - BNP >4,500.  - Chest x-ray showed diffuse interstitial prominence with streaky interstitial opacities within the mid to lower lung fields and small left pleural effusion.  - Echo showed LVEF of 25-30% with global hypokinesis and grade 2 diastolic dysfunction. EF down from 60-65% in 01/2019. - Volume status managed via dialysis. - Will start Hydralazine $RemoveBeforeDEI'10mg'laVVFQQDSYgHBqwi$  three times daily and Imdur $RemoveB'15mg'urJVHQxm$  daily. - No ACEI/ARB/ARNI or MRA given renal function. - No beta-blocker for now given bradycardia but may be able to restart tomorrow. - Etiology possibly tachy-mediated given reports of intermittent palpitations for the last several weeks; however, ischemic cardiomyopathy also on the differential. Discussed with MD - will start GDMT and plan to repeat Echo in 3 months. If EF still low, will need ischemic evaluation.  PAD - S/p bilateral BKA. - Continue aspirin and statin.  Hypertension - BP currently well controlled. - On Amlodipine at home. This will be stopped so we can maximize GDMT for CHF. - Start Hydralazine and Imdur as above. - If heart rates improve, will add Toprol-XL.  ESRD  - S/p failed renal transplant on hemodialysis on T/Th/Sat. - Management per Nephrology.  Otherwise, per primary team: - COVID pneumonia  - Type 1 diabetes   For questions or updates, please contact [facility] Please consult [email or web address] for contact info under     Signed, Darreld Mclean, PA-C  06/14/2020 4:24 PM

## 2020-06-14 NOTE — Progress Notes (Incomplete)
CRITICAL VALUE ALERT  Critical Value:  APTT greater than 200  Date & Time Notied: 06/14/20 @ 6:20 am  Provider Notified: Sidney Ace, MD notified.   Orders Received/Actions taken:

## 2020-06-14 NOTE — Progress Notes (Signed)
ANTICOAGULATION CONSULT NOTE - Foll ow Up Consult  Pharmacy Consult for IV Heparin Indication: atrial fibrillation  Allergies  Allergen Reactions  . Metformin And Related Other (See Comments)    Has kidney problems    Patient Measurements: Height: 6' (182.9 cm) Weight: 72.9 kg (160 lb 11.5 oz) IBW/kg (Calculated) : 77.6 Heparin Dosing Weight: 72.9 kg  Vital Signs: BP: 125/73 (01/26 0358)  Labs: Recent Labs    06/12/20 0900 06/12/20 0907 06/12/20 1026 06/12/20 2000 06/13/20 0500 06/13/20 0803 06/14/20 0447  HGB  --  11.2*  --   --  10.6*  --  11.6*  HCT  --  36.6* 35.3*  --  36.2*  --  37.0*  PLT  --  85*  --   --  106*  --  102*  APTT 45*  --   --  49*  --  73* >200*  HEPARINUNFRC <0.10*  --   --   --   --  0.15* 0.60  CREATININE  --  6.37*  --   --  6.91*  --  5.47*    Estimated Creatinine Clearance: 13.7 mL/min (A) (by C-G formula based on SCr of 5.47 mg/dL (H)).   Assessment: 65 yr old man to transition from apixaban to heparin for afib while NPO. Pt was on apixaban PTA; last dose 06/06/20 at 0800, per med rec. Pt with ESRD, on TTS HD schedule.   Heparin level therapeutic, PTT > 200 seconds this AM (will stop PTTs as apixaban should now be cleared)  Goal of Therapy:  Heparin level 0.3-0.7 units/ml aPTT 66-102 seconds Monitor platelets by anticoagulation protocol: Yes   Plan:  Continue heparin at 1600 units / hr Next labs in AM F/U transition back to apixaban when mentation improves  Thank you Anette Guarneri, PharmD 06/14/2020,8:34 AM

## 2020-06-14 NOTE — Progress Notes (Signed)
Patient will need to dialyze in isolation for 14 days from positive test (unsure when this was as notes state he tested positive prior to arrival to ED). Patient's home clinic/[facility] will dialyze him in isolation on his regular day and since he is second shift, he will be asked to arrive at 1pm. Navigator called wife to inform. She will be able to transport. He will be able to resume [facility] [place] once he is out of isolation shift. Navigator following for disposition when patient is medically ready.  Alphonzo Cruise, [facility] Navigator [phone]

## 2020-06-15 DIAGNOSIS — I480 Paroxysmal atrial fibrillation: Secondary | ICD-10-CM | POA: Diagnosis not present

## 2020-06-15 DIAGNOSIS — R001 Bradycardia, unspecified: Secondary | ICD-10-CM

## 2020-06-15 DIAGNOSIS — I420 Dilated cardiomyopathy: Secondary | ICD-10-CM | POA: Diagnosis not present

## 2020-06-15 LAB — CBC WITH DIFFERENTIAL/PLATELET
Abs Immature Granulocytes: 0.05 10*3/uL (ref 0.00–0.07)
Basophils Absolute: 0 10*3/uL (ref 0.0–0.1)
Basophils Relative: 0 %
Eosinophils Absolute: 0 10*3/uL (ref 0.0–0.5)
Eosinophils Relative: 0 %
HCT: 37 % — ABNORMAL LOW (ref 39.0–52.0)
Hemoglobin: 11.7 g/dL — ABNORMAL LOW (ref 13.0–17.0)
Immature Granulocytes: 1 %
Lymphocytes Relative: 10 %
Lymphs Abs: 0.8 10*3/uL (ref 0.7–4.0)
MCH: 27.5 pg (ref 26.0–34.0)
MCHC: 31.6 g/dL (ref 30.0–36.0)
MCV: 87.1 fL (ref 80.0–100.0)
Monocytes Absolute: 0.5 10*3/uL (ref 0.1–1.0)
Monocytes Relative: 6 %
Neutro Abs: 6.8 10*3/uL (ref 1.7–7.7)
Neutrophils Relative %: 83 %
Platelets: 117 10*3/uL — ABNORMAL LOW (ref 150–400)
RBC: 4.25 MIL/uL (ref 4.22–5.81)
RDW: 17.4 % — ABNORMAL HIGH (ref 11.5–15.5)
WBC: 8.2 10*3/uL (ref 4.0–10.5)
nRBC: 0 % (ref 0.0–0.2)

## 2020-06-15 LAB — MAGNESIUM: Magnesium: 2.1 mg/dL (ref 1.7–2.4)

## 2020-06-15 LAB — COMPREHENSIVE METABOLIC PANEL
ALT: 18 U/L (ref 0–44)
AST: 31 U/L (ref 15–41)
Albumin: 1.8 g/dL — ABNORMAL LOW (ref 3.5–5.0)
Alkaline Phosphatase: 74 U/L (ref 38–126)
Anion gap: 17 — ABNORMAL HIGH (ref 5–15)
BUN: 78 mg/dL — ABNORMAL HIGH (ref 8–23)
CO2: 20 mmol/L — ABNORMAL LOW (ref 22–32)
Calcium: 6.6 mg/dL — ABNORMAL LOW (ref 8.9–10.3)
Chloride: 102 mmol/L (ref 98–111)
Creatinine, Ser: 6.42 mg/dL — ABNORMAL HIGH (ref 0.61–1.24)
GFR, Estimated: 9 mL/min — ABNORMAL LOW (ref >60)
Glucose, Bld: 148 mg/dL — ABNORMAL HIGH (ref 70–99)
Potassium: 3.6 mmol/L (ref 3.5–5.1)
Sodium: 139 mmol/L (ref 135–145)
Total Bilirubin: 0.7 mg/dL (ref 0.3–1.2)
Total Protein: 4.3 g/dL — ABNORMAL LOW (ref 6.5–8.1)

## 2020-06-15 LAB — HEPARIN LEVEL (UNFRACTIONATED): Heparin Unfractionated: 0.68 IU/mL (ref 0.30–0.70)

## 2020-06-15 LAB — GLUCOSE, CAPILLARY
Glucose-Capillary: 139 mg/dL — ABNORMAL HIGH (ref 70–99)
Glucose-Capillary: 206 mg/dL — ABNORMAL HIGH (ref 70–99)
Glucose-Capillary: 147 mg/dL — ABNORMAL HIGH (ref 70–99)
Glucose-Capillary: 124 mg/dL — ABNORMAL HIGH (ref 70–99)

## 2020-06-15 LAB — D-DIMER, QUANTITATIVE: D-Dimer, Quant: 2.05 ug/mL-FEU — ABNORMAL HIGH (ref 0.00–0.50)

## 2020-06-15 MED ORDER — APIXABAN 5 MG PO TABS
5.0000 mg | ORAL_TABLET | Freq: Two times a day (BID) | ORAL | Status: DC
  Administered 2020-06-15 – 2020-07-20 (×70): 5 mg via ORAL
  Filled 2020-06-15 (×72): qty 1

## 2020-06-15 MED ORDER — DARBEPOETIN ALFA 60 MCG/0.3ML IJ SOSY
60.0000 ug | PREFILLED_SYRINGE | INTRAMUSCULAR | Status: DC
  Administered 2020-06-16: 60 ug via INTRAVENOUS

## 2020-06-15 MED ORDER — VANCOMYCIN HCL IN DEXTROSE 750-5 MG/150ML-% IV SOLN
750.0000 mg | Freq: Once | INTRAVENOUS | Status: AC
  Administered 2020-06-16: 750 mg via INTRAVENOUS
  Filled 2020-06-15: qty 150

## 2020-06-15 NOTE — Progress Notes (Signed)
[facility] Progress Note  Patient: Paul [name]    QHU:[id number]  DOA: 06/11/2020     Date of Service: the patient was seen and examined on 06/15/2020  Brief hospital course: 65 year old with history of paroxysmal A. fib, PAD, ESRD with failed renal transplant on HD TTS, DM2, diastolic CHF presented with complaints of shortness of breath after missing dialysis.  COVID-19 positive.  Chest x-ray showed multifocal opacities with left sided pleural effusion.  He was found to have COVID-19 pneumonia, atrial fibrillation with RVR and concerns of CHF exacerbation.  Blood cultures growing Enterococcus started on vancomycin.  Infectious disease consulted.   Currently plan is continue antibiotics.  Assessment and Plan: Acute respiratory distress Multiple etiology given underlying COVID-19 pneumonia, fluid overload from missing dialysis.   Continue supplemental oxygen as needed. Likely can come off the oxygen  Enterococcus bacteremia Potential UA as a source.  IV vancomycin started ID following 2D echocardiogram no vegetation. EF 25-30%.  Surveillance cultures ordered 1/25, so far negative. ID recommends 2 weeks of IV antibiotics.  No TEE.  COVID-19 pneumonia Chest x-ray showing bilateral opacities.  Pulmonary edema Treated Solu-Medrol, Remdesivir, Procalcitonin-0.44 BNP- >4500 Incentive spirometer, flutter valve  Acute metabolic encephalopathy-resolved No acute findings but it shows interval lacunar infarct? On aspirin and statin LDL 64, A1c 7.5 UA-positive TSH, B12 and folate  ESRD on hemodialysis, failed renal transplant.  TTS Seen by nephrology.  HD plans for nephrology  Acute systolic CHF on chronic diastolic CHF. Echo this admission EF 25%. Echo 01/2019-EF 65%.   Presented with volume overload. Cardiology consulted due to acute drop in the EF which is likely secondary to tachycardia as well as sepsis. Cardiology will initiate GDMT, repeat follow-up echocardiogram in  the clinic. Ischemic work-up outpatient if no improvement in EF. Appreciate assistance.  Paroxysmal atrial fibrillation with infrequent RVR On Norvasc and Lopressor. Ziio patch was ordered outpatient.  On home Eliquis. We will resume.  Diabetes mellitus type 2, mild hyperglycemia secondary to steroid use Increase Lantus 10 units daily.  Insulin sliding scale and Accu-Chek.  Essential hypertension Norvasc 10 mg daily.  Metoprolol 25 mg twice daily  GERD PPI  Depression Zoloft at bedtime  BPH Flomax  Diet: Renal diet dysphagia 2 nectar thick DVT Prophylaxis:    apixaban (ELIQUIS) tablet 5 mg    Advance goals of care discussion: Full code  Family Communication: no family was present at bedside, at the time of interview.   Disposition:  Status is: Inpatient  Remains inpatient appropriate because:IV treatments appropriate due to intensity of illness or inability to take PO  Dispo: The patient is from: Home              Anticipated d/c is to: Home              Anticipated d/c date is: 2 days              Patient currently is not medically stable to d/c.   Difficult to place patient No  Subjective: No nausea no vomiting.  No fever no chills.  No chest pain.  Abdominal pain.  No active bleeding to provide the patient.  Tolerating p.o. medication as well as p.o. diet.  Physical Exam:  General: Appear in mild distress, no Rash; Oral Mucosa Clear, moist. no Abnormal Neck Mass Or lumps, Conjunctiva normal  Cardiovascular: S1 and S2 Present, no Murmur, Respiratory: good respiratory effort, Bilateral Air entry present and CTA, no Crackles, no wheezes Abdomen: Bowel Sound present,  Soft and no tenderness Extremities: Bilateral amputation. Neurology: alert and oriented to time, place, and person affect appropriate. no new focal deficit Gait not checked due to patient safety concerns  Vitals:   06/15/20 0535 06/15/20 0925 06/15/20 1626 06/15/20 2014  BP: 116/72 118/70  (!) 142/76 119/65  Pulse: (!) 50 62 (!) 55 (!) 55  Resp: 16 18 18 17   Temp: 98 F (36.7 C) 98 F (36.7 C) 98.2 F (36.8 C) 98.9 F (37.2 C)  TempSrc: Oral Oral Oral Oral  SpO2: 98% 99% 98% 99%  Weight:      Height:        Intake/Output Summary (Last 24 hours) at 06/15/2020 2056 Last data filed at 06/15/2020 1700 Gross per 24 hour  Intake 1026.06 ml  Output -  Net 1026.06 ml   Filed Weights   06/11/20 2300  Weight: 72.9 kg    Data Reviewed: I have personally reviewed and interpreted daily labs, tele strips, imaging. I reviewed all nursing notes, pharmacy notes, vitals, pertinent old records I have discussed plan of care as described above with RN and patient/family.  CBC: Recent Labs  Lab 06/11/20 1326 06/12/20 0907 06/12/20 1026 06/13/20 0500 06/14/20 0447 06/15/20 0454  WBC 7.6 4.4  --  7.5 8.3 8.2  NEUTROABS 5.7 3.5  --  6.1 7.3 6.8  HGB 12.4* 11.2*  --  10.6* 11.6* 11.7*  HCT 43.1 36.6* 35.3* 36.2* 37.0* 37.0*  MCV 91.5 89.9  --  91.0 87.7 87.1  PLT 101* 85*  --  106* 102* 341*   Basic Metabolic Panel: Recent Labs  Lab 06/11/20 1326 06/12/20 0907 06/13/20 0500 06/14/20 0447 06/15/20 0454  NA 140 140 137 138 139  K 3.9 3.9 3.8 3.2* 3.6  CL 101 103 98 100 102  CO2 23 22 20* 21* 20*  GLUCOSE 232* 271* 158* 164* 148*  BUN 36* 49* 66* 58* 78*  CREATININE 5.78* 6.37* 6.91* 5.47* 6.42*  CALCIUM 8.2* 7.9* 7.6* 7.3* 6.6*  MG  --   --  2.2 2.2 2.1    Studies: No results found.  Scheduled Meds: . apixaban  5 mg Oral BID  . aspirin  81 mg Oral Daily  . atorvastatin  10 mg Oral Q M,W,F  . bisacodyl  10 mg Oral Daily  . [START ON 06/22/2020] Darbepoetin Alfa  60 mcg Intravenous Q Thu-HD  . docusate sodium  100 mg Oral BID  . hydrALAZINE  10 mg Oral TID  . insulin aspart  0-20 Units Subcutaneous TID WC  . insulin glargine  10 Units Subcutaneous Daily  . isosorbide mononitrate  15 mg Oral Daily  . lipase/protease/amylase  12,000 Units Oral TID AC  .  magnesium chloride  2 tablet Oral BID  . pantoprazole  80 mg Oral Daily  . polyethylene glycol  17 g Oral Daily  . predniSONE  50 mg Oral Daily  . sertraline  50 mg Oral QHS  . sodium chloride flush  3 mL Intravenous Q12H  . tacrolimus  2 mg Oral BID  . tamsulosin  0.4 mg Oral QPC supper   Continuous Infusions: . sodium chloride    . [START ON 06/16/2020] vancomycin    . vancomycin 750 mg (06/13/20 1118)   PRN Meds: sodium chloride, acetaminophen, chlorpheniramine-HYDROcodone, guaiFENesin-dextromethorphan, Ipratropium-Albuterol, metoprolol tartrate, Resource ThickenUp Clear, sodium chloride flush, traMADol  Time spent: 35 minutes  Author: Berle Mull, MD [facility] 06/15/2020 8:56 PM  To reach On-call, see care teams to locate the  attending and reach out via [email or web address]. Between 7PM-7AM, please contact night-coverage If you still have difficulty reaching the attending provider, please page the [facility] (Director on Call) for [facility] on amion for assistance.

## 2020-06-15 NOTE — Progress Notes (Signed)
Physical Therapy Treatment Patient Details Name: Paul [name] MRN: [id number] DOB: 04-12-1956 Today's Date: 06/15/2020    History of Present Illness This 65yo male was referred to PT on 04/27/2019 by [name], DO with weakness & gait disorder.  He had PT following bil. TTAs & TIA and successfully became ambulatory at community level. His kidney transplant is failing and is back on transplant list. He was hospitalized 4 times in 2020 with medical issues. His current prostheses delivered late 2020.   HTN, DM2, renal transplant, CKD st IV, calciphylaxis, A-Fib, bil. TTAs, TIA    PT Comments    Patient received in bed, pleasant and cooperative, reports that his wife was able to bring his prosthetics as she had tested negative for covid. Still in need of 2 person assist for bed mobility and functional transfers- not quite ready to try gait today due to fatigue with transfers but made great progress today! Able to manage prosthetics mostly on a supervision basis but did need a little help getting the left one all the way on. SPO2 98% on 1LPM with activity. Left up in recliner with all needs met this afternoon, RN aware of patient status. Would still benefit from rehab prior to return home.     Follow Up Recommendations  SNF;Supervision/Assistance - 24 hour;Other (comment) (pending progress- might be able to upgrade to HHPT)     Equipment Recommendations  Wheelchair cushion (measurements PT);Wheelchair (measurements PT);3in1 (PT);Rolling walker with 5" wheels    Recommendations for Other Services       Precautions / Restrictions Precautions Precautions: Fall;Other (comment) Precaution Comments: B BKA with prosthetics in room now Restrictions Weight Bearing Restrictions: No    Mobility  Bed Mobility Overal bed mobility: Needs Assistance Bed Mobility: Rolling;Supine to Sit Rolling: Supervision   Supine to sit: Max assist;+2 for physical assistance     General bed mobility comments:  able to roll with supervision with rail, but needed MaxAx2 for getting up to midilne sitting from flat bed today due to generalized weakness  Transfers Overall transfer level: Needs assistance Equipment used: Rolling walker (2 wheeled) Transfers: Sit to/from [facility] Sit to Stand: Mod assist;+2 physical assistance     Anterior-Posterior transfers: Min assist;+2 physical assistance   General transfer comment: ModAx2 to boost to upright standing; on first attempt not quite able to fully extend trunk, on second attempt able to come up to full upright and got better triceps press down on RW for trunk extension. Able to pivot into recliner with MinAx2 for balance and RW management with B prosthetics on  Ambulation/Gait             General Gait Details: deferred- fatigue   Stairs             Wheelchair Mobility    Modified Rankin (Stroke Patients Only)       Balance Overall balance assessment: Needs assistance Sitting-balance support: Bilateral upper extremity supported;Single extremity supported Sitting balance-Leahy Scale: Fair     Standing balance support: Bilateral upper extremity supported;During functional activity Standing balance-Leahy Scale: Poor Standing balance comment: reliant on external support and BUE support                            Cognition Arousal/Alertness: Awake/alert Behavior During Therapy: WFL for tasks assessed/performed Overall Cognitive Status: Within Functional Limits for tasks assessed  Exercises      General Comments General comments (skin integrity, edema, etc.): on 1LPM, SPO2 98% with activity and HR WNL      Pertinent Vitals/Pain Pain Assessment: Faces Faces Pain Scale: Hurts a little bit Pain Location: generalized Pain Descriptors / Indicators: Discomfort Pain Intervention(s): Limited activity within patient's tolerance;Monitored during  session    Home Living                      Prior Function            PT Goals (current goals can now be found in the care plan section) Acute Rehab PT Goals Patient Stated Goal: be able to go home from hospital PT Goal Formulation: With patient Time For Goal Achievement: 06/27/20 Potential to Achieve Goals: Fair Progress towards PT goals: Progressing toward goals    Frequency    Min 3X/week      PT Plan Current plan remains appropriate;Equipment recommendations need to be updated    Co-evaluation              AM-PAC PT "6 Clicks" Mobility   Outcome Measure  Help needed turning from your back to your side while in a flat bed without using bedrails?: A Little Help needed moving from lying on your back to sitting on the side of a flat bed without using bedrails?: A Lot Help needed moving to and from a bed to a chair (including a wheelchair)?: A Little Help needed standing up from a chair using your arms (e.g., wheelchair or bedside chair)?: A Lot Help needed to walk in hospital room?: A Lot Help needed climbing 3-5 steps with a railing? : Total 6 Click Score: 13    End of Session Equipment Utilized During Treatment: Oxygen;Gait belt Activity Tolerance: Patient tolerated treatment well Patient left: in chair;with call bell/phone within reach Nurse Communication: Mobility status PT Visit Diagnosis: Other abnormalities of gait and mobility (R26.89);Muscle weakness (generalized) (M62.81)     Time: 1410-1445 PT Time Calculation (min) (ACUTE ONLY): 35 min  Charges:  $Therapeutic Activity: 23-37 mins                     [name], DPT, PN1   Supplemental Physical Therapist Jaconita    Pager [phone] Acute Rehab Office [phone]

## 2020-06-15 NOTE — NC FL2 (Signed)
[place] LEVEL OF CARE SCREENING TOOL     IDENTIFICATION  Patient Name: Paul [name] Birthdate: 23-Jun-1955 Sex: male Admission Date (Current Location): 06/11/2020  [facility] and [place] Number:  Herbalist and Address:  [facility] [place], [place] [address] [place], [place] [address]      Provider Number: [id number]  Attending Physician Name and Address:  Lavina Hamman, MD  Relative Name and Phone Number:       Current Level of Care: Hospital Recommended Level of Care: [place] Prior Approval Number:    Date Approved/Denied:   PASRR Number: [id number]  Discharge Plan: SNF    Current Diagnoses: Patient Active Problem List   Diagnosis Date Noted  . Bacteremia due to Enterococcus 06/12/2020  . PAF (paroxysmal atrial fibrillation) ([place]) 06/11/2020  . Pneumonia due to COVID-19 virus 06/11/2020  . Acute heart failure with preserved ejection fraction (HFpEF) ([place]) 06/11/2020  . Labile blood glucose   . Diabetic peripheral neuropathy ([place])   . Anemia of chronic disease   . Right below-knee amputee ([place]) 01/18/2020  . Amputation stump infection ([place])   . Subacute osteomyelitis of right tibia ([place])   . Dehiscence of amputation stump ([place])   . Sepsis ([place]) 01/04/2020  . Cellulitis 12/31/2019  . CKD stage 4 due to type 2 diabetes mellitus ([place]) 04/29/2019  . AKI (acute kidney injury) ([place]) 04/24/2019  . Hyperglycemia 04/24/2019  . Nausea and vomiting 04/24/2019  . Fever 04/23/2019  . Acute metabolic encephalopathy 56/21/3086  . Stroke ([place]) 10/28/2018  . Middle cerebral artery embolism, left 10/28/2018  . Metabolic acidosis 57/84/6962  . Dehydration 05/24/2018  . Stage II pressure ulcer ([place]) 05/24/2018  . Dyspnea 05/23/2018  . Acute respiratory failure with hypoxia ([place]) 05/23/2018  . Abnormal cardiovascular stress test 12/29/2013  . Awaiting organ transplant 12/09/2013  . Leg ulcer ([place]) 11/17/2013  . Chronic  kidney disease (CKD), stage V ([place]) 11/17/2013  . Diabetes ([place]) 11/17/2013  . Cancer of duodenum ([place]) 11/17/2013  . Dyslipidemia 11/17/2013  . Chronic kidney disease requiring chronic dialysis ([place]) 11/17/2013  . Hypercholesteremia 11/17/2013  . H/O malignant neoplasm 11/17/2013  . H/O: HTN (hypertension) 11/17/2013  . BP (high blood pressure) 11/17/2013  . Hypotension 11/17/2013  . Angiopathy, peripheral ([place]) 11/17/2013  . AF (paroxysmal atrial fibrillation) ([place]) 11/17/2013  . BKA stump complication ([place]) 95/28/4132  . S/P bilateral BKA (below knee amputation) ([place]) 08/03/2013  . S/P BKA (below knee amputation) bilateral ([place]) 07/30/2013  . Open wound of both legs with complication 44/05/270  . Acute blood loss anemia 07/12/2013  . Syncope 07/09/2013  . Intolerance to CAPD peritoneal dialysis s/p CAPD cath removal 07/09/2013 07/05/2013  . Critical lower limb ischemia ([place]) 06/30/2013  . Extremity pain 06/30/2013  . Atherosclerosis of native arteries of the extremities with ulceration(440.23) 06/17/2013  . Gait disorder 05/31/2013  . Weakness 05/24/2013  . Depression 04/03/2013  . GERD (gastroesophageal reflux disease) 04/03/2013  . Atrial fibrillation ([place]) 04/02/2013  . DM (diabetes mellitus) ([place]) 12/28/2012  . Other complications due to renal dialysis device, implant, and graft 05/19/2012  . Diabetes mellitus, type 2 ([place]) 11/13/2011  . Anemia 07/02/2011  . ESRD on hemodialysis ([place]) 05/29/2011    Orientation RESPIRATION BLADDER Height & Weight     Self,Time,Situation,Place  Normal Continent Weight: 72.9 kg Height:  6' (182.9 cm)  BEHAVIORAL SYMPTOMS/MOOD NEUROLOGICAL BOWEL NUTRITION STATUS      Continent Diet  AMBULATORY STATUS COMMUNICATION OF  NEEDS Skin   Limited Assist Verbally Bruising,Other (Comment) (bilateral arms; msad to buttocks)                       Personal Care Assistance Level of Assistance  Bathing,Dressing,Feeding Bathing Assistance:  Limited assistance Feeding assistance: Limited assistance Dressing Assistance: Limited assistance     Functional Limitations Info  Sight,Hearing,Speech Sight Info: Adequate Hearing Info: Adequate Speech Info: Adequate    SPECIAL CARE FACTORS FREQUENCY  PT (By licensed PT),OT (By licensed OT)     PT Frequency: PT at SNF to eval and treat a min of 5x/week OT Frequency: OT at SNF to eval and treat a min of 5x/week            Contractures Contractures Info: Not present    Additional Factors Info  Code Status,Allergies,Isolation Precautions Code Status Info: Full Allergies Info: Metformin     Isolation Precautions Info: Covid+ 06/11/2020; vaccinated [place] COVID-19 Vaccine 08/06/2019 , 07/16/2019     Current Medications (06/15/2020):  This is the current hospital active medication list Current Facility-Administered Medications  Medication Dose Route Frequency Provider Last Rate Last Admin  . 0.9 %  sodium chloride infusion  250 mL Intravenous PRN Norins, Heinz Knuckles, MD      . acetaminophen (TYLENOL) tablet 650 mg  650 mg Oral Q6H PRN Neena Rhymes, MD   650 mg at 06/13/20 2227  . apixaban (ELIQUIS) tablet 5 mg  5 mg Oral BID Lavina Hamman, MD   5 mg at 06/15/20 1051  . aspirin chewable tablet 81 mg  81 mg Oral Daily Neena Rhymes, MD   81 mg at 06/15/20 2426  . atorvastatin (LIPITOR) tablet 10 mg  10 mg Oral Q M,W,F Norins, Heinz Knuckles, MD   10 mg at 06/14/20 0830  . bisacodyl (DULCOLAX) EC tablet 10 mg  10 mg Oral Daily Norins, Heinz Knuckles, MD   10 mg at 06/15/20 8341  . chlorpheniramine-HYDROcodone (TUSSIONEX) 10-8 MG/5ML suspension 5 mL  5 mL Oral Q12H PRN Norins, Heinz Knuckles, MD   5 mL at 06/14/20 2242  . [START ON 06/22/2020] Darbepoetin Alfa (ARANESP) injection 60 mcg  60 mcg Intravenous Q Thu-HD Lavina Hamman, MD      . docusate sodium (COLACE) capsule 100 mg  100 mg Oral BID Neena Rhymes, MD   100 mg at 06/15/20 9622  . guaiFENesin-dextromethorphan (ROBITUSSIN DM)  100-10 MG/5ML syrup 10 mL  10 mL Oral Q4H PRN Norins, Heinz Knuckles, MD      . hydrALAZINE (APRESOLINE) tablet 10 mg  10 mg Oral TID Sande Rives E, PA-C   10 mg at 06/15/20 2979  . insulin aspart (novoLOG) injection 0-20 Units  0-20 Units Subcutaneous TID WC Norins, Heinz Knuckles, MD   7 Units at 06/15/20 1224  . insulin glargine (LANTUS) injection 10 Units  10 Units Subcutaneous Daily Damita Lack, MD   10 Units at 06/15/20 0858  . Ipratropium-Albuterol (COMBIVENT) respimat 1 puff  1 puff Inhalation Q6H PRN Amin, Ankit Chirag, MD      . isosorbide mononitrate (IMDUR) 24 hr tablet 15 mg  15 mg Oral Daily Sande Rives E, PA-C   15 mg at 06/15/20 8921  . lipase/protease/amylase (CREON) capsule 12,000 Units  12,000 Units Oral TID AC Norins, Heinz Knuckles, MD   12,000 Units at 06/15/20 1054  . magnesium chloride (SLOW-MAG) 64 MG SR tablet 128 mg  2 tablet Oral BID Norins, Heinz Knuckles, MD  128 mg at 06/15/20 0900  . metoprolol tartrate (LOPRESSOR) injection 5 mg  5 mg Intravenous Q4H PRN Amin, Ankit Chirag, MD      . pantoprazole (PROTONIX) EC tablet 80 mg  80 mg Oral Daily Norins, Heinz Knuckles, MD   80 mg at 06/15/20 5993  . polyethylene glycol (MIRALAX / GLYCOLAX) packet 17 g  17 g Oral Daily Norins, Heinz Knuckles, MD   17 g at 06/15/20 5701  . predniSONE (DELTASONE) tablet 50 mg  50 mg Oral Daily Norins, Heinz Knuckles, MD   50 mg at 06/15/20 7793  . Resource ThickenUp Clear   Oral PRN Damita Lack, MD      . sertraline (ZOLOFT) tablet 50 mg  50 mg Oral QHS Norins, Heinz Knuckles, MD   50 mg at 06/14/20 2242  . sodium chloride flush (NS) 0.9 % injection 3 mL  3 mL Intravenous Q12H Norins, Heinz Knuckles, MD   3 mL at 06/13/20 1009  . sodium chloride flush (NS) 0.9 % injection 3 mL  3 mL Intravenous PRN Norins, Heinz Knuckles, MD      . tacrolimus (PROGRAF) capsule 2 mg  2 mg Oral BID Norins, Heinz Knuckles, MD   2 mg at 06/15/20 9030  . tamsulosin (FLOMAX) capsule 0.4 mg  0.4 mg Oral QPC supper Neena Rhymes, MD   0.4 mg  at 06/14/20 1720  . traMADol (ULTRAM) tablet 50 mg  50 mg Oral Q6H PRN Norins, Heinz Knuckles, MD      . vancomycin (VANCOREADY) IVPB 750 mg/150 mL  750 mg Intravenous Q T,Th,Sa-HD [name], RPH 150 mL/hr at 06/13/20 1118 750 mg at 06/13/20 1118     Discharge Medications: Please see discharge summary for a list of discharge medications.  Relevant Imaging Results:  Relevant Lab Results:   Additional Information SSN [id number]; ESRD TTS at [place], RN

## 2020-06-15 NOTE — TOC Initial Note (Signed)
Transition of Care [facility]) - Initial/Assessment Note    Patient Details  Name: Paul [name] MRN: [id number] Date of Birth: Oct 05, 1955  Transition of [facility]) CM/SW Contact:    Bartholomew Crews, RN Phone Number: [phone] 06/15/2020, 4:13 PM  Clinical Narrative:                  Spoke with patient on his room phone to discuss transition planning. Patient wanting to go home, but is agreeable to going to SNF for rehab. Verbal consent to speak with his wife, [name].   Spoke with his wife on her mobile phone. Discussed SNF for rehab. Patient has been to inpatient rehab in the past - advised that d/t Covid he could not go to inpatient rehab until he was 21 days post Covid dx. [name] stated that he is too weak for her to care for him at this time. Agreeable to SNF search. FL2 faxed. Bed offers pending. Spoke with Bryson Ha at [facility] - no beds at this time, but maybe sometime next week.   TOC following for transition needs.   Expected Discharge Plan: Skilled Nursing Facility Barriers to Discharge: Continued Medical Work up   Patient Goals and CMS Choice Patient states their goals for this hospitalization and ongoing recovery are:: rehab before home CMS Medicare.gov Compare Post Acute Care list provided to:: Patient Choice offered to / list presented to : [facility]  Expected Discharge Plan and Services Expected Discharge Plan: [place] In-house Referral: NA Discharge Planning Services: CM Consult Post Acute Care Choice: [place] Living arrangements for the past 2 months: Single Family Home                 DME Arranged: N/A DME Agency: NA       HH Arranged: NA HH Agency: NA        Prior Living Arrangements/Services Living arrangements for the past 2 months: Single Family Home Lives with:: Self,Spouse Patient language and need for interpreter reviewed:: Yes Do you feel safe going back to the place where you live?: Yes      Need for  Family Participation in Patient Care: Yes (Comment) Care giver support system in place?: Yes (comment) Current home services: DME Criminal Activity/Legal Involvement Pertinent to Current Situation/Hospitalization: No - Comment as needed  Activities of Daily Living      Permission Sought/Granted Permission sought to share information with : Family Supports Permission granted to share information with : Yes, Verbal Permission Granted  Share Information with NAME: Pameal Hemric     Permission granted to share info w Relationship: wife  Permission granted to share info w Contact Information: [phone]  Emotional Assessment Appearance:: Appears stated age Attitude/Demeanor/Rapport: Engaged Affect (typically observed): Accepting Orientation: : Oriented to Self,Oriented to  Time,Oriented to Place,Oriented to Situation Alcohol / Substance Use: Not Applicable Psych Involvement: No (comment)  Admission diagnosis:  Shortness of breath [R06.02] PAF (paroxysmal atrial fibrillation) (HCC) [I48.0] Weakness [R53.1] Acute heart failure with preserved ejection fraction (HFpEF) ([place]) [I50.31] COVID-19 virus infection [U07.1] Pneumonia due to COVID-19 virus [U07.1, J12.82] Patient Active Problem List   Diagnosis Date Noted  . Bacteremia due to Enterococcus 06/12/2020  . PAF (paroxysmal atrial fibrillation) ([place]) 06/11/2020  . Pneumonia due to COVID-19 virus 06/11/2020  . Acute heart failure with preserved ejection fraction (HFpEF) ([place]) 06/11/2020  . Labile blood glucose   . Diabetic peripheral neuropathy ([place])   . Anemia of chronic disease   . Right below-knee amputee (  [place]) 01/18/2020  . Amputation stump infection ([place])   . Subacute osteomyelitis of right tibia ([place])   . Dehiscence of amputation stump ([place])   . Sepsis ([place]) 01/04/2020  . Cellulitis 12/31/2019  . CKD stage 4 due to type 2 diabetes mellitus ([place]) 04/29/2019  . AKI (acute kidney injury) ([place]) 04/24/2019  . Hyperglycemia  04/24/2019  . Nausea and vomiting 04/24/2019  . Fever 04/23/2019  . Acute metabolic encephalopathy 38/32/9191  . Stroke ([place]) 10/28/2018  . Middle cerebral artery embolism, left 10/28/2018  . Metabolic acidosis 66/10/43  . Dehydration 05/24/2018  . Stage II pressure ulcer ([place]) 05/24/2018  . Dyspnea 05/23/2018  . Acute respiratory failure with hypoxia ([place]) 05/23/2018  . Abnormal cardiovascular stress test 12/29/2013  . Awaiting organ transplant 12/09/2013  . Leg ulcer ([place]) 11/17/2013  . Chronic kidney disease (CKD), stage V ([place]) 11/17/2013  . Diabetes ([place]) 11/17/2013  . Cancer of duodenum ([place]) 11/17/2013  . Dyslipidemia 11/17/2013  . Chronic kidney disease requiring chronic dialysis ([place]) 11/17/2013  . Hypercholesteremia 11/17/2013  . H/O malignant neoplasm 11/17/2013  . H/O: HTN (hypertension) 11/17/2013  . BP (high blood pressure) 11/17/2013  . Hypotension 11/17/2013  . Angiopathy, peripheral ([place]) 11/17/2013  . AF (paroxysmal atrial fibrillation) ([place]) 11/17/2013  . BKA stump complication ([place]) 99/77/4142  . S/P bilateral BKA (below knee amputation) ([place]) 08/03/2013  . S/P BKA (below knee amputation) bilateral ([place]) 07/30/2013  . Open wound of both legs with complication 39/53/2023  . Acute blood loss anemia 07/12/2013  . Syncope 07/09/2013  . Intolerance to CAPD peritoneal dialysis s/p CAPD cath removal 07/09/2013 07/05/2013  . Critical lower limb ischemia ([place]) 06/30/2013  . Extremity pain 06/30/2013  . Atherosclerosis of native arteries of the extremities with ulceration(440.23) 06/17/2013  . Gait disorder 05/31/2013  . Weakness 05/24/2013  . Depression 04/03/2013  . GERD (gastroesophageal reflux disease) 04/03/2013  . Atrial fibrillation ([place]) 04/02/2013  . DM (diabetes mellitus) ([place]) 12/28/2012  . Other complications due to renal dialysis device, implant, and graft 05/19/2012  . Diabetes mellitus, type 2 ([place]) 11/13/2011  . Anemia 07/02/2011  . ESRD on  hemodialysis ([place]) 05/29/2011   PCP:  [name], MD Pharmacy:   [id number] [address], [place] [address] Phone: [phone] Fax: [phone]     Social Determinants of Health (SDOH) Interventions    Readmission Risk Interventions Readmission Risk Prevention Plan 01/18/2020 04/26/2019 11/04/2018  Transportation Screening Complete Complete Complete  Medication Review Press photographer) Complete Complete Complete  PCP or Specialist appointment within 3-5 days of discharge Complete Not Complete Complete  PCP/Specialist Appt Not Complete comments - pending medical stability -  HRI or Home Care Consult Complete Complete Complete  SW Recovery Care/Counseling Consult Complete Complete Complete  Palliative Care Screening Not Applicable Not Complete Not Applicable  Comments - may be appropriate, will ask MD -  [place] Not Applicable Patient Refused Not Applicable  Some recent data might be hidden

## 2020-06-15 NOTE — Progress Notes (Addendum)
Progress Note  Patient Name: Paul [name] Date of Encounter: 06/15/2020  [facility] Cardiologist: Evalina Field, MD   Subjective   Continues to maintain sinus on tele but bradycardic in the 40-50's.  Denies any chest pain.  SOB and cough has improved  Inpatient Medications    Scheduled Meds: . aspirin  81 mg Oral Daily  . atorvastatin  10 mg Oral Q M,W,F  . bisacodyl  10 mg Oral Daily  . [START ON 06/22/2020] Darbepoetin Alfa  60 mcg Intravenous Q Thu-HD  . docusate sodium  100 mg Oral BID  . hydrALAZINE  10 mg Oral TID  . insulin aspart  0-20 Units Subcutaneous TID WC  . insulin glargine  10 Units Subcutaneous Daily  . isosorbide mononitrate  15 mg Oral Daily  . lipase/protease/amylase  12,000 Units Oral TID AC  . magnesium chloride  2 tablet Oral BID  . pantoprazole  80 mg Oral Daily  . polyethylene glycol  17 g Oral Daily  . predniSONE  50 mg Oral Daily  . sertraline  50 mg Oral QHS  . sodium chloride flush  3 mL Intravenous Q12H  . tacrolimus  2 mg Oral BID  . tamsulosin  0.4 mg Oral QPC supper   Continuous Infusions: . sodium chloride    . heparin 1,600 Units/hr (06/15/20 0825)  . vancomycin 750 mg (06/13/20 1118)   PRN Meds: sodium chloride, acetaminophen, chlorpheniramine-HYDROcodone, guaiFENesin-dextromethorphan, Ipratropium-Albuterol, metoprolol tartrate, Resource ThickenUp Clear, sodium chloride flush, traMADol   Vital Signs    Vitals:   06/14/20 1650 06/14/20 2000 06/14/20 2150 06/15/20 0535  BP: 118/62 128/78  116/72  Pulse: (!) 52 (!) 48  (!) 50  Resp: $Remo'17 18  16  'XhIfx$ Temp: 98.2 F (36.8 C)  98.3 F (36.8 C) 98 F (36.7 C)  TempSrc:  Oral Oral Oral  SpO2: 97% 99%  98%  Weight:      Height:        Intake/Output Summary (Last 24 hours) at 06/15/2020 0924 Last data filed at 06/15/2020 0700 Gross per 24 hour  Intake 1480.96 ml  Output -  Net 1480.96 ml   Last 3 Weights 06/11/2020 05/29/2020 03/10/2020  Weight (lbs) 160 lb 11.5 oz 160 lb 12.8  oz 145 lb  Weight (kg) 72.9 kg 72.938 kg 65.772 kg      Telemetry    Sinus bradycardia in the upper 40-'s to low 50's - Personally Reviewed  ECG    No new EKG to review - Personally Reviewed  Physical Exam   PE not done due to telemedicine visit to reduce risk of COVID in patient with COVID 19 PNA  Labs    High Sensitivity Troponin:  No results for input(s): TROPONINIHS in the last 720 hours.    Chemistry Recent Labs  Lab 06/13/20 0500 06/14/20 0447 06/15/20 0454  NA 137 138 139  K 3.8 3.2* 3.6  CL 98 100 102  CO2 20* 21* 20*  GLUCOSE 158* 164* 148*  BUN 66* 58* 78*  CREATININE 6.91* 5.47* 6.42*  CALCIUM 7.6* 7.3* 6.6*  PROT 5.3* 5.0* 4.3*  ALBUMIN 2.2* 2.1* 1.8*  AST $Re'15 23 31  'iTA$ ALT $R'8 13 18  'tw$ ALKPHOS 71 71 74  BILITOT 0.8 1.1 0.7  GFRNONAA 8* 11* 9*  ANIONGAP 19* 17* 17*     Hematology Recent Labs  Lab 06/13/20 0500 06/14/20 0447 06/15/20 0454  WBC 7.5 8.3 8.2  RBC 3.98* 4.22 4.25  HGB 10.6* 11.6* 11.7*  HCT 36.2* 37.0* 37.0*  MCV 91.0 87.7 87.1  MCH 26.6 27.5 27.5  MCHC 29.3* 31.4 31.6  RDW 17.6* 17.6* 17.4*  PLT 106* 102* 117*    BNP Recent Labs  Lab 06/11/20 1326  BNP >4,500.0*     DDimer  Recent Labs  Lab 06/13/20 0803 06/14/20 0447 06/15/20 0454  DDIMER 2.87* 2.38* 2.05*     CHA2DS2-VASc Score =    This indicates a  % annual risk of stroke. The patient's score is based upon:        Radiology    DG Swallowing Func-Speech Pathology  Result Date: 06/13/2020 Objective Swallowing Evaluation: Type of Study: MBS-Modified Barium Swallow Study  Patient Details Name: Paul [name] MRN: [id number] Date of Birth: 11/14/1955 Today's Date: 06/13/2020 Time: SLP Start Time (ACUTE ONLY): 1600 -SLP Stop Time (ACUTE ONLY): 1620 SLP Time Calculation (min) (ACUTE ONLY): 20 min Past Medical History: Past Medical History: Diagnosis Date . A-fib ([place])  . Anemia   esrd . Calciphylaxis 05/2013  lower ext . Cancer ([place])   hx duodenal adenoCa 2002, resected  . Closed left arm fracture 1967 . Corneal abrasion, left  . Depression  . Diabetes mellitus   Type 1 iddm x 11 yrs . Diabetic neuropathy ([place])  . ESRD on hemodialysis ([place])   Pt has ESRD due to DM.  He had a L upper arm AVF prior to starting HD which was ligated due to L arm steal syndrome.  He started HD in Jan 2014 and did home HD.  The family couldn't cannulate the R arm AVF successfully so by mid 2014 they decided to switch to PD which was done late summer 2014.  In Jan 2015 he was admitted with FTT felt to be due to underdialysis on PD and PD was abandoned and he  . GERD (gastroesophageal reflux disease)  . Glomerulosclerosis, diabetic ([place])  . Heart murmur  . History of blood transfusion  . Hypertension   off of meds due to orthostatic hypotension . Kidney transplant recipient  . Open wound of both legs with complication   Pt has had progressive wounds of both LE's including gangrene of the toes and patchy necrosis of the calves.  He has been treated at [facility] by Dr Jerline Pain with hyperbaric O2 5d per week.  He is getting Na Thiosulfate with HD for suspected calciphylaxis. Pt says doctor's aren't sure if these ulcers were diabetic ulcers or calciphylaxis.  He underwent bilat BKA on 07/30/13.  He says that the woun Past Surgical History: Past Surgical History: Procedure Laterality Date . AMPUTATION Bilateral 07/30/2013  Procedure: AMPUTATION BELOW KNEE;  Surgeon: Newt Minion, MD;  Location: [place];  Service: Orthopedics;  Laterality: Bilateral;  Bilateral Below Knee Amputations . AMPUTATION Bilateral 09/03/2013  Procedure: AMPUTATION BELOW KNEE;  Surgeon: Newt Minion, MD;  Location: [place];  Service: Orthopedics;  Laterality: Bilateral;  Bilateral Below Knee Amputation Revision . AV FISTULA PLACEMENT  06/07/2011  Procedure: ARTERIOVENOUS (AV) FISTULA CREATION;  Surgeon: Angelia Mould, MD;  Location: [place];  Service: Vascular;  Laterality: Left; . AV FISTULA PLACEMENT  06/18/2012  Procedure:  ARTERIOVENOUS (AV) FISTULA CREATION;  Surgeon: Mal Misty, MD;  Location: [place];  Service: Vascular;  Laterality: Right;  [place] . BILIARY DIVERSION, EXTERNAL  2002 . BONE MARROW BIOPSY  2012 . CAPD INSERTION N/A 01/14/2013  Procedure: LAPAROSCOPIC INSERTION CONTINUOUS AMBULATORY PERITONEAL DIALYSIS  (CAPD) CATHETER;  Surgeon: Adin Hector, MD;  Location: [place];  Service: General;  Laterality: N/A; . CAPD REMOVAL N/A 07/09/2013  Procedure: CONTINUOUS AMBULATORY PERITONEAL DIALYSIS  (CAPD) CATHETER REMOVAL;  Surgeon: Adin Hector, MD;  Location: [place];  Service: General;  Laterality: N/A; . COLONOSCOPY    Hx: of . INSERTION OF DIALYSIS CATHETER  05/22/2012  Procedure: INSERTION OF DIALYSIS CATHETER;  Surgeon: Mal Misty, MD;  Location: [place];  Service: Vascular;  Laterality: N/A;  right internal jugular vein . IR ANGIO INTRA EXTRACRAN SEL COM CAROTID INNOMINATE BILAT MOD SED  10/28/2018 . IR ANGIO VERTEBRAL SEL SUBCLAVIAN INNOMINATE UNI R MOD SED  10/28/2018 . IR ANGIO VERTEBRAL SEL VERTEBRAL UNI L MOD SED  10/28/2018 . LIGATION OF ARTERIOVENOUS  FISTULA  05/22/2012  Procedure: LIGATION OF ARTERIOVENOUS  FISTULA;  Surgeon: Mal Misty, MD;  Location: [place];  Service: Vascular;  Laterality: Left;  Left brachio-cephalic fistula . LOWER EXTREMITY ANGIOGRAM N/A 06/14/2013  Procedure: LOWER EXTREMITY ANGIOGRAM;  Surgeon: Angelia Mould, MD;  Location: [facility] CATH LAB;  Service: Cardiovascular;  Laterality: N/A; . NEPHRECTOMY TRANSPLANTED ORGAN   . OTHER SURGICAL HISTORY    Cyst removed from back . PORTACATH PLACEMENT  2002 . RADIOLOGY WITH ANESTHESIA N/A 10/28/2018  Procedure: IR WITH ANESTHESIA;  Surgeon: Radiologist, Medication, MD;  Location: [place];  Service: Radiology;  Laterality: N/A; . REMOVAL OF A DIALYSIS CATHETER   . RENAL BIOPSY, PERCUTANEOUS  2012 . STUMP REVISION Right 01/12/2020  Procedure: REVISION RIGHT BELOW KNEE AMPUTATION;  Surgeon: Newt Minion, MD;  Location: [place];  Service: Orthopedics;   Laterality: Right; . TONSILLECTOMY   . VASCULAR SURGERY   . WHIPPLE PROCEDURE  2002  duodenal ca, Dr. Harlow Asa HPI: JAEDIN REGINA is a 65 y.o. male with medical history significant of of pAF (recurrence 12/2019 in setting of septic shock), PAD, ESRD with failed renal transplant on HD TuThSat,  DM, diastolicCVA who presents to the ED via EMS with a chief complaint of missed dialysis, COVID-positive. Patient reports being diagnosed with Covid 19 x a few days ago. Symptoms have been kept under control at home until he felt weaker today. Reports overall malaise along with shortness of breath. He is on dialysis THS but missed yesterday due to "bad weather".  No data recorded Assessment / Plan / Recommendation CHL IP CLINICAL IMPRESSIONS 06/13/2020 Clinical Impression Pt presents with oropharyngeal dysphagia characterized by reduced bolus cohesion, reduced lingual retraction, reduced pharyngeal constriction, an inconsistent pharyngeal delay, and reduced anterior laryngeal movement. He demonstrated moderate vallecular residue, mild pyriform sinus residue, and mild posterior pharyngeal wall residue. Amount of residue increased with bolus size. Residue was reduced, but not eliminated with secondary swallows or a liquid wash. A single instance of aspiration (PAS 7) was noted before deglutition due to premature spillage and a pharyngeal delay. Aspiration was sensed, but cough was weak and ineffective. A chin tuck posture was effective in reducing premature spillage and in reducing pharyngeal residue. It is recommended that the pt's current diet of dysphagia 2 solids and nectar thick liquids be continued with observance of swallowing precautions. There is potential for diet advancement to include thin liquids with consistent use of a chin tuck, but SLP will follow for dysphagia treatment and trial this more in sessions. SLP Visit Diagnosis Dysphagia, pharyngeal phase (R13.13) Attention and concentration deficit following -- Frontal  lobe and executive function deficit following -- Impact on safety and function Moderate aspiration risk   CHL IP TREATMENT RECOMMENDATION 06/13/2020 Treatment Recommendations Therapy as outlined in treatment  plan below   Prognosis 06/13/2020 Prognosis for Safe Diet Advancement Good Barriers to Reach Goals -- Barriers/Prognosis Comment -- CHL IP DIET RECOMMENDATION 06/13/2020 SLP Diet Recommendations Nectar thick liquid;Dysphagia 2 (Fine chop) solids Liquid Administration via Cup;Straw Medication Administration Whole meds with liquid Compensations Slow rate;Small sips/bites;Chin tuck Postural Changes Seated upright at 90 degrees   CHL IP OTHER RECOMMENDATIONS 06/13/2020 Recommended Consults -- Oral Care Recommendations Oral care BID Other Recommendations Order thickener from pharmacy;Have oral suction available   CHL IP FOLLOW UP RECOMMENDATIONS 06/13/2020 Follow up Recommendations Skilled Nursing facility   [facility] IP FREQUENCY AND DURATION 06/13/2020 Speech Therapy Frequency (ACUTE ONLY) min 2x/week Treatment Duration 2 weeks      CHL IP ORAL PHASE 06/13/2020 Oral Phase Impaired Oral - Pudding Teaspoon -- Oral - Pudding Cup -- Oral - Honey Teaspoon -- Oral - Honey Cup -- Oral - Nectar Teaspoon -- Oral - Nectar Cup Decreased bolus cohesion;Premature spillage Oral - Nectar Straw Decreased bolus cohesion;Premature spillage Oral - Thin Teaspoon -- Oral - Thin Cup Premature spillage;Decreased bolus cohesion Oral - Thin Straw -- Oral - Puree WFL Oral - Mech Soft -- Oral - Regular WFL Oral - Multi-Consistency -- Oral - Pill WFL Oral Phase - Comment --  CHL IP PHARYNGEAL PHASE 06/13/2020 Pharyngeal Phase Impaired Pharyngeal- Pudding Teaspoon -- Pharyngeal -- Pharyngeal- Pudding Cup -- Pharyngeal -- Pharyngeal- Honey Teaspoon -- Pharyngeal -- Pharyngeal- Honey Cup -- Pharyngeal -- Pharyngeal- Nectar Teaspoon -- Pharyngeal -- Pharyngeal- Nectar Cup Pharyngeal residue - valleculae;Pharyngeal residue - pyriform;Pharyngeal residue -  posterior pharnyx;Reduced anterior laryngeal mobility;Reduced pharyngeal peristalsis;Reduced tongue base retraction Pharyngeal -- Pharyngeal- Nectar Straw Pharyngeal residue - valleculae;Pharyngeal residue - pyriform;Pharyngeal residue - posterior pharnyx;Reduced anterior laryngeal mobility;Reduced pharyngeal peristalsis;Reduced tongue base retraction Pharyngeal -- Pharyngeal- Thin Teaspoon -- Pharyngeal -- Pharyngeal- Thin Cup Pharyngeal residue - valleculae;Pharyngeal residue - pyriform;Pharyngeal residue - posterior pharnyx;Reduced anterior laryngeal mobility;Reduced pharyngeal peristalsis;Reduced tongue base retraction Pharyngeal -- Pharyngeal- Thin Straw Pharyngeal residue - valleculae;Pharyngeal residue - pyriform;Pharyngeal residue - posterior pharnyx;Reduced anterior laryngeal mobility;Reduced pharyngeal peristalsis;Reduced tongue base retraction;Penetration/Aspiration before swallow Pharyngeal Material enters airway, passes BELOW cords and not ejected out despite cough attempt by patient Pharyngeal- Puree Pharyngeal residue - valleculae;Pharyngeal residue - pyriform;Pharyngeal residue - posterior pharnyx;Reduced anterior laryngeal mobility;Reduced pharyngeal peristalsis;Reduced tongue base retraction Pharyngeal -- Pharyngeal- Mechanical Soft -- Pharyngeal -- Pharyngeal- Regular Pharyngeal residue - valleculae;Pharyngeal residue - pyriform;Pharyngeal residue - posterior pharnyx;Reduced anterior laryngeal mobility;Reduced pharyngeal peristalsis;Reduced tongue base retraction Pharyngeal -- Pharyngeal- Multi-consistency -- Pharyngeal -- Pharyngeal- Pill -- Pharyngeal -- Pharyngeal Comment --  CHL IP CERVICAL ESOPHAGEAL PHASE 06/13/2020 Cervical Esophageal Phase WFL Pudding Teaspoon -- Pudding Cup -- Honey Teaspoon -- Honey Cup -- Nectar Teaspoon -- Nectar Cup -- Nectar Straw -- Thin Teaspoon -- Thin Cup -- Thin Straw -- Puree -- Mechanical Soft -- Regular -- Multi-consistency -- Pill -- Cervical Esophageal  Comment -- Shanika I. Hardin Negus, [place], [place] Office number [phone] Pager [place] 06/13/2020, 5:09 PM              ECHOCARDIOGRAM LIMITED  Result Date: 06/13/2020    ECHOCARDIOGRAM LIMITED REPORT   Patient Name:   Paul [name] Date of Exam: 06/13/2020 Medical Rec #:  [id number]     Height:       72.0 in Accession #:    [id number]    Weight:       160.7 lb Date of Birth:  [birth year]     BSA:  1.941 m Patient Age:    34 years      BP:           145/81 mmHg Patient Gender: M             HR:           47 bpm. Exam Location:  Inpatient Procedure: Limited Echo, Limited Color Doppler and Cardiac Doppler Indications:    Congestive heart failure  History:        Patient has prior history of Echocardiogram examinations, most                 recent 01/28/2019. CHF, Covid. End stage renal disease,                 Arrythmias:Paroxysmal [id number], Signs/Symptoms:Bacteremia; Risk                 Factors:Diabetes.  Sonographer:    Johny Chess Referring Phys: [place]  1. Limited study to assess LV function; not all views obtained.  2. Left ventricular ejection fraction, by estimation, is 25 to 30%. The left ventricle has severely decreased function. The left ventricle demonstrates global hypokinesis. Left ventricular diastolic parameters are consistent with Grade II diastolic dysfunction (pseudonormalization).  3. Right ventricular systolic function is mildly reduced. The right ventricular size is normal. There is normal pulmonary artery systolic pressure.  4. Left atrial size was moderately dilated.  5. Right atrial size was mildly dilated.  6. Moderate pleural effusion in the left lateral region.  7. The mitral valve is normal in structure. Mild mitral valve regurgitation. No evidence of mitral stenosis.  8. The aortic valve is tricuspid. Aortic valve regurgitation is mild. Mild to moderate aortic valve sclerosis/calcification is  present, without any evidence of aortic stenosis.  9. The inferior vena cava is normal in size with greater than 50% respiratory variability, suggesting right atrial pressure of 3 mmHg. FINDINGS  Left Ventricle: Left ventricular ejection fraction, by estimation, is 25 to 30%. The left ventricle has severely decreased function. The left ventricle demonstrates global hypokinesis. The left ventricular internal cavity size was normal in size. There is no left ventricular hypertrophy. Left ventricular diastolic parameters are consistent with Grade II diastolic dysfunction (pseudonormalization). Right Ventricle: The right ventricular size is normal. No increase in right ventricular wall thickness. Right ventricular systolic function is mildly reduced. There is normal pulmonary artery systolic pressure. The tricuspid regurgitant velocity is 2.38 m/s, and with an assumed right atrial pressure of 3 mmHg, the estimated right ventricular systolic pressure is 93.8 mmHg. Left Atrium: Left atrial size was moderately dilated. Right Atrium: Right atrial size was mildly dilated. Pericardium: There is no evidence of pericardial effusion. Mitral Valve: The mitral valve is normal in structure. Mild mitral annular calcification. Mild mitral valve regurgitation. No evidence of mitral valve stenosis. Tricuspid Valve: The tricuspid valve is normal in structure. Tricuspid valve regurgitation is trivial. No evidence of tricuspid stenosis. Aortic Valve: The aortic valve is tricuspid. Aortic valve regurgitation is mild. Mild to moderate aortic valve sclerosis/calcification is present, without any evidence of aortic stenosis. Pulmonic Valve: The pulmonic valve was not well visualized. Pulmonic valve regurgitation is not visualized. Aorta: The aortic root is normal in size and structure. Venous: The inferior vena cava is normal in size with greater than 50% respiratory variability, suggesting right atrial pressure of 3 mmHg.  Additional  Comments: Limited study to assess LV function; not all views obtained. There is a moderate pleural  effusion in the left lateral region. LEFT VENTRICLE PLAX 2D LVIDd:         5.30 cm  Diastology LVIDs:         4.50 cm  LV e' medial:    4.46 cm/s LV PW:         1.10 cm  LV E/e' medial:  14.5 LV IVS:        1.00 cm  LV e' lateral:   5.77 cm/s LVOT diam:     2.40 cm  LV E/e' lateral: 11.2 LV SV:         72 LV SV Index:   37 LVOT Area:     4.52 cm  LEFT ATRIUM         Index LA diam:    5.10 cm 2.63 cm/m  AORTIC VALVE LVOT Vmax:   65.90 cm/s LVOT Vmean:  43.400 cm/s LVOT VTI:    0.159 m  AORTA Ao Asc diam: 3.60 cm MITRAL VALVE               TRICUSPID VALVE MV Area (PHT): 2.24 cm    TR Peak grad:   22.7 mmHg MV Decel Time: 338 msec    TR Vmax:        238.00 cm/s MV E velocity: 64.70 cm/s MV A velocity: 28.70 cm/s  SHUNTS MV E/A ratio:  2.25        Systemic VTI:  0.16 m                            Systemic Diam: 2.40 cm Kirk Ruths MD Electronically signed by Kirk Ruths MD Signature Date/Time: 2020-06-26/6:50:33 PM    Final     Cardiac Studies   2D echo Echocardiogram 26-Jun-2020: Impression: 1. Limited study to assess LV function; not all views obtained.  2. Left ventricular ejection fraction, by estimation, is 25 to 30%. The  left ventricle has severely decreased function. The left ventricle  demonstrates global hypokinesis. Left ventricular diastolic parameters are  consistent with Grade II diastolic  dysfunction (pseudonormalization).  3. Right ventricular systolic function is mildly reduced. The right  ventricular size is normal. There is normal pulmonary artery systolic  pressure.  4. Left atrial size was moderately dilated.  5. Right atrial size was mildly dilated.  6. Moderate pleural effusion in the left lateral region.  7. The mitral valve is normal in structure. Mild mitral valve  regurgitation. No evidence of mitral stenosis.  8. The aortic valve is tricuspid. Aortic valve  regurgitation is mild.  Mild to moderate aortic valve sclerosis/calcification is present, without  any evidence of aortic stenosis.  9. The inferior vena cava is normal in size with greater than 50%  respiratory variability, suggesting right atrial pressure of 3 mmHg.   Patient Profile     65 y.o. male with a historyof paroxysmal atrial fibrillation on Eliquis, PAD s/p bilateral BKA, mild aortic stenosis on Echo in 01/2019, CVA, hypertension, type 1 diabetes mellitus, ESRD with failed renal transplant on hemodialysis on T/Th/Sat, and duodenal cancer s/p resectionwho is being seen for the evaluation ofatrial fibrillationat the request ofDr. Posey Pronto.  Assessment & Plan    Paroxysmal Atrial Fibrillation with RVR - Patient admitted with COVID pneumonia after presenting with generalized weakness and hypoxia. Found to be in atrial fibrillation with RVR. - EKG showed atrial fibrillation, rate 134 bpm, with mild ST depression in inferior leads and slight ST elevation in aVR and  V1 consistent. Not consistent with STEMI.  - Initially started on IV Cardizem and converted to sinus rhythm with rates  in the high 40's to 50's. Now off Cardizem. - Potassium 3.6 today. Goal >4.0. Supplement as needed. - Magnesium 2.1 today. Goal 2.0. Supplement as needed. - TSH normal.  - On Lopressor 25mg  twice daily at home>>now on hold due to bradycardia - continue to hold BB due to bradycardia on tele in the 40-50's - On Eliquis 5mg  twice daily at home>>was placed on IV Heparin on admit>>ok to change back to Eliquis 5mg  BID  Acute on Chronic Combined CHF - BNP >4,500.  - Chest x-ray showed diffuse interstitial prominence with streaky interstitial opacities within the mid to lower lung fields and small left pleural effusion.  - Echo showed LVEF of 25-30% with global hypokinesis and grade 2 diastolic dysfunction. EF down from 60-65% in 01/2019. - Volume status managed via dialysis. - Etiology possibly  tachy-mediated given reports of intermittent palpitations for the last several weeks however, ischemic cardiomyopathy also on the differential as well as COVID 19 CM. Will start GDMT and plan to repeat Echo in 3 months. If EF still low, will need ischemic evaluation. - yesterday started Hydralazine 10mg  three times daily and Imdur 15mg  daily. - No ARNI or MRA given renal function. - will discuss with renal whether ok to add ARB - No beta-blocker for now given bradycardia   PAD - S/p bilateral BKA. - Continue aspirin and statin.  Hypertension - BP currently well controlled at 116/75mmHg - home amlodipine stopped to  maximize GDMT for CHF. - Started Hydralazine 10mg  TID and Imdur 15mg  daily - If heart rates improve, will add Toprol-XL.  ESRD  - S/p failed renal transplant on hemodialysis on T/Th/Sat. - Management per Nephrology.  Otherwise, per primary team: - COVID pneumonia  - Type 1 diabetes      For questions or updates, please contact [facility] Please consult [email or web address] for contact info under        Signed, Fransico Him, MD  06/15/2020, 9:24 AM

## 2020-06-15 NOTE — Plan of Care (Signed)
  Problem: Education: Goal: Knowledge of General Education information will improve Description: Including pain rating scale, medication(s)/side effects and non-pharmacologic comfort measures Outcome: Progressing   Problem: Coping: Goal: Level of anxiety will decrease Outcome: Progressing   Problem: Safety: Goal: Ability to remain free from injury will improve Outcome: Progressing   

## 2020-06-15 NOTE — Progress Notes (Signed)
ANTICOAGULATION CONSULT NOTE - Foll ow Up Consult  Pharmacy Consult for IV Heparin Indication: atrial fibrillation  Allergies  Allergen Reactions  . Metformin And Related Other (See Comments)    Has kidney problems    Patient Measurements: Height: 6' (182.9 cm) Weight: 72.9 kg (160 lb 11.5 oz) IBW/kg (Calculated) : 77.6 Heparin Dosing Weight: 72.9 kg  Vital Signs: Temp: 98 F (36.7 C) (01/27 0535) Temp Source: Oral (01/27 0535) BP: 116/72 (01/27 0535) Pulse Rate: 50 (01/27 0535)  Labs: Recent Labs    06/12/20 2000 06/13/20 0500 06/13/20 0803 06/14/20 0447 06/15/20 0454  HGB  --  10.6*  --  11.6* 11.7*  HCT  --  36.2*  --  37.0* 37.0*  PLT  --  106*  --  102* 117*  APTT 49*  --  73* >200*  --   HEPARINUNFRC  --   --  0.15* 0.60 0.68  CREATININE  --  6.91*  --  5.47* 6.42*    Estimated Creatinine Clearance: 11.7 mL/min (A) (by C-G formula based on SCr of 6.42 mg/dL (H)).   Assessment: 65 yr old man to transition from apixaban to heparin for afib while NPO. Pt was on apixaban PTA; last dose 06/06/20 at 0800, per med rec. Pt with ESRD, on TTS HD schedule.   Heparin level therapeutic  Goal of Therapy:  Heparin level 0.3-0.7 units/ml aPTT 66-102 seconds Monitor platelets by anticoagulation protocol: Yes   Plan:  Continue heparin at 1600 units / hr Next labs in AM F/U transition back to apixaban when mentation improves  Thank you Anette Guarneri, PharmD 06/15/2020,9:04 AM

## 2020-06-15 NOTE — Progress Notes (Signed)
[facility] Progress Note  Subjective: pt seen in room. NO c/o's.   Background on admission: Paul [name] is a 65 y.o. 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 Vitals:   06/14/20 1650 06/14/20 2000 06/14/20 2150 06/15/20 0535  BP: 118/62 128/78  116/72  Pulse: (!) 52 (!) 48  (!) 50  Resp: 17 18  16   Temp: 98.2 F (36.8 C)  98.3 F (36.8 C) 98 F (36.7 C)  TempSrc:  Oral Oral Oral  SpO2: 97% 99%  98%  Weight:      Height:        Exam:   alert, chronically ill, nad   no jvd  Chest cta bilat  Cor reg no RG  Abd soft ntnd no ascites   Ext B BKA, no stump edema   Alert, NF, ox3, gen 'd weakness   RFA AVF +Bruit        OP HD: TTS NW  3.5h   66kh  3K/2.25 bath  RFA AVF  Hep none  - hect 2 ug  - mircera 200 q2 , last 1/13   CXR 1/23 - IMPRESSION: Diffuse interstitial prominence with streaky interstitial opacities within the mid to lower lung fields and small left pleural effusion. Findings may reflect multifocal atypical/viral infection versus pulmonary edema.  Assessment/ Plan: 1.  COVID-19 pneumonia: With O2 requirement. Per admitting team. 2.  Tachycardia/A-fib RVR: off cardizem, converted to sinus bradycardia. Cards following.   3.  A/C combined CHF: adding low dose ARB per cardiology. LVEF 25-30% here, G2DD. On hydralazine and imdur also. No BB d/t bradycardia.  4.  Hypoxemia: on 2 L [place], possibly some edema by CXR in addition to infiltrates.  5.  ESRD: Usual TTS schedule - missed HD Sat 1/22. Missed HD yest and will miss HD again today due to high census/ inadequate staff. HD tomorrow.   6.  Hypertension/volume: BP high, up 6kg by wt's.    7.  Anemia: Hgb > 12, no ESA for now. 8.  Metabolic bone disease: CorrCa ok, Phos pending. Resume home binders (Renvela). 9.  T1DM: Insulin per  primary   Paul [name] 06/15/2020, 2:37 PM   Recent Labs  Lab 06/14/20 0447 06/15/20 0454  K 3.2* 3.6  BUN 58* 78*  CREATININE 5.47* 6.42*  CALCIUM 7.3* 6.6*  HGB 11.6* 11.7*   Inpatient medications: . apixaban  5 mg Oral BID  . aspirin  81 mg Oral Daily  . atorvastatin  10 mg Oral Q M,W,F  . bisacodyl  10 mg Oral Daily  . [START ON 06/22/2020] Darbepoetin Alfa  60 mcg Intravenous Q Thu-HD  . docusate sodium  100 mg Oral BID  . hydrALAZINE  10 mg Oral TID  . insulin aspart  0-20 Units Subcutaneous TID WC  . insulin glargine  10 Units Subcutaneous Daily  . isosorbide mononitrate  15 mg Oral Daily  . lipase/protease/amylase  12,000 Units Oral TID AC  . magnesium chloride  2 tablet Oral BID  . pantoprazole  80 mg Oral Daily  . polyethylene glycol  17 g Oral Daily  . predniSONE  50 mg Oral Daily  . sertraline  50 mg Oral QHS  . sodium chloride flush  3 mL Intravenous Q12H  . tacrolimus  2 mg Oral BID  . tamsulosin  0.4 mg Oral  QPC supper   . sodium chloride    . vancomycin 750 mg (06/13/20 1118)   sodium chloride, acetaminophen, chlorpheniramine-HYDROcodone, guaiFENesin-dextromethorphan, Ipratropium-Albuterol, metoprolol tartrate, Resource ThickenUp Clear, sodium chloride flush, traMADol

## 2020-06-16 DIAGNOSIS — I480 Paroxysmal atrial fibrillation: Secondary | ICD-10-CM | POA: Diagnosis not present

## 2020-06-16 DIAGNOSIS — I420 Dilated cardiomyopathy: Secondary | ICD-10-CM | POA: Diagnosis not present

## 2020-06-16 DIAGNOSIS — R001 Bradycardia, unspecified: Secondary | ICD-10-CM | POA: Diagnosis not present

## 2020-06-16 DIAGNOSIS — R7881 Bacteremia: Secondary | ICD-10-CM | POA: Diagnosis not present

## 2020-06-16 DIAGNOSIS — B952 Enterococcus as the cause of diseases classified elsewhere: Secondary | ICD-10-CM | POA: Diagnosis not present

## 2020-06-16 LAB — D-DIMER, QUANTITATIVE: D-Dimer, Quant: 2.95 ug/mL-FEU — ABNORMAL HIGH (ref 0.00–0.50)

## 2020-06-16 LAB — COMPREHENSIVE METABOLIC PANEL
ALT: 20 U/L (ref 0–44)
AST: 25 U/L (ref 15–41)
Albumin: 1.9 g/dL — ABNORMAL LOW (ref 3.5–5.0)
Alkaline Phosphatase: 70 U/L (ref 38–126)
Anion gap: 19 — ABNORMAL HIGH (ref 5–15)
BUN: 95 mg/dL — ABNORMAL HIGH (ref 8–23)
CO2: 19 mmol/L — ABNORMAL LOW (ref 22–32)
Calcium: 6.6 mg/dL — ABNORMAL LOW (ref 8.9–10.3)
Chloride: 99 mmol/L (ref 98–111)
Creatinine, Ser: 6.78 mg/dL — ABNORMAL HIGH (ref 0.61–1.24)
GFR, Estimated: 8 mL/min — ABNORMAL LOW (ref >60)
Glucose, Bld: 187 mg/dL — ABNORMAL HIGH (ref 70–99)
Potassium: 3.5 mmol/L (ref 3.5–5.1)
Sodium: 137 mmol/L (ref 135–145)
Total Bilirubin: 0.8 mg/dL (ref 0.3–1.2)
Total Protein: 4.3 g/dL — ABNORMAL LOW (ref 6.5–8.1)

## 2020-06-16 LAB — CBC WITH DIFFERENTIAL/PLATELET
Abs Immature Granulocytes: 0.1 10*3/uL — ABNORMAL HIGH (ref 0.00–0.07)
Basophils Absolute: 0 10*3/uL (ref 0.0–0.1)
Basophils Relative: 0 %
Eosinophils Absolute: 0 10*3/uL (ref 0.0–0.5)
Eosinophils Relative: 0 %
HCT: 41.3 % (ref 39.0–52.0)
Hemoglobin: 12.6 g/dL — ABNORMAL LOW (ref 13.0–17.0)
Immature Granulocytes: 1 %
Lymphocytes Relative: 8 %
Lymphs Abs: 0.6 10*3/uL — ABNORMAL LOW (ref 0.7–4.0)
MCH: 26.7 pg (ref 26.0–34.0)
MCHC: 30.5 g/dL (ref 30.0–36.0)
MCV: 87.5 fL (ref 80.0–100.0)
Monocytes Absolute: 0.5 10*3/uL (ref 0.1–1.0)
Monocytes Relative: 6 %
Neutro Abs: 6.5 10*3/uL (ref 1.7–7.7)
Neutrophils Relative %: 85 %
Platelets: 109 10*3/uL — ABNORMAL LOW (ref 150–400)
RBC: 4.72 MIL/uL (ref 4.22–5.81)
RDW: 17.5 % — ABNORMAL HIGH (ref 11.5–15.5)
WBC: 7.7 10*3/uL (ref 4.0–10.5)
nRBC: 0 % (ref 0.0–0.2)

## 2020-06-16 LAB — CULTURE, BLOOD (ROUTINE X 2)

## 2020-06-16 LAB — GLUCOSE, CAPILLARY
Glucose-Capillary: 184 mg/dL — ABNORMAL HIGH (ref 70–99)
Glucose-Capillary: 202 mg/dL — ABNORMAL HIGH (ref 70–99)
Glucose-Capillary: 180 mg/dL — ABNORMAL HIGH (ref 70–99)
Glucose-Capillary: 183 mg/dL — ABNORMAL HIGH (ref 70–99)

## 2020-06-16 LAB — MAGNESIUM: Magnesium: 2.2 mg/dL (ref 1.7–2.4)

## 2020-06-16 LAB — C-REACTIVE PROTEIN: CRP: 3.9 mg/dL — ABNORMAL HIGH (ref <1.0)

## 2020-06-16 MED ORDER — DARBEPOETIN ALFA 60 MCG/0.3ML IJ SOSY
PREFILLED_SYRINGE | INTRAMUSCULAR | Status: AC
  Filled 2020-06-16: qty 0.3

## 2020-06-16 MED ORDER — VANCOMYCIN HCL IN DEXTROSE 750-5 MG/150ML-% IV SOLN
INTRAVENOUS | Status: AC
  Filled 2020-06-16: qty 150

## 2020-06-16 MED ORDER — CHLORHEXIDINE GLUCONATE CLOTH 2 % EX PADS: 6.0000 | MEDICATED_PAD | Freq: Every day | CUTANEOUS | Status: DC

## 2020-06-16 MED ORDER — METOPROLOL SUCCINATE ER 25 MG PO TB24
25.0000 mg | ORAL_TABLET | Freq: Every day | ORAL | Status: DC
  Administered 2020-06-17 – 2020-07-06 (×13): 25 mg via ORAL
  Filled 2020-06-16 (×22): qty 1

## 2020-06-16 NOTE — Progress Notes (Signed)
Progress Note  Patient Name: RUFFUS KAMAKA Date of Encounter: 06/16/2020  [facility] Cardiologist: Evalina Field, MD   Subjective   PT just got back from dialysis   Breathing is OK  No CP   Inpatient Medications    Scheduled Meds: . apixaban  5 mg Oral BID  . aspirin  81 mg Oral Daily  . atorvastatin  10 mg Oral Q M,W,F  . bisacodyl  10 mg Oral Daily  . Darbepoetin Alfa      . [START ON 06/22/2020] Darbepoetin Alfa  60 mcg Intravenous Q Thu-HD  . docusate sodium  100 mg Oral BID  . hydrALAZINE  10 mg Oral TID  . insulin aspart  0-20 Units Subcutaneous TID WC  . insulin glargine  10 Units Subcutaneous Daily  . isosorbide mononitrate  15 mg Oral Daily  . lipase/protease/amylase  12,000 Units Oral TID AC  . magnesium chloride  2 tablet Oral BID  . pantoprazole  80 mg Oral Daily  . polyethylene glycol  17 g Oral Daily  . predniSONE  50 mg Oral Daily  . sertraline  50 mg Oral QHS  . sodium chloride flush  3 mL Intravenous Q12H  . tacrolimus  2 mg Oral BID  . tamsulosin  0.4 mg Oral QPC supper   Continuous Infusions: . sodium chloride    . Vancomycin    . vancomycin    . vancomycin 750 mg (06/13/20 1118)   PRN Meds: sodium chloride, acetaminophen, chlorpheniramine-HYDROcodone, guaiFENesin-dextromethorphan, Ipratropium-Albuterol, metoprolol tartrate, Resource ThickenUp Clear, sodium chloride flush, traMADol   Vital Signs    Vitals:   06/16/20 0930 06/16/20 1000 06/16/20 1038 06/16/20 1045  BP: (!) 121/54 124/61 137/70 126/60  Pulse:      Resp: 13  12 15   Temp:    97.7 F (36.5 C)  TempSrc:    Axillary  SpO2:   100%   Weight:   62.1 kg   Height:        Intake/Output Summary (Last 24 hours) at 06/16/2020 1103 Last data filed at 06/16/2020 1038 Gross per 24 hour  Intake 300 ml  Output 625 ml  Net -325 ml   Last 3 Weights 06/16/2020 06/16/2020 06/11/2020  Weight (lbs) 136 lb 14.5 oz 139 lb 15.9 oz 160 lb 11.5 oz  Weight (kg) 62.1 kg 63.5 kg 72.9 kg       Telemetry    Sinus rhythm/sinus bradycarda - Personally Reviewed  ECG    No new EKG to review - Personally Reviewed  Physical Exam   PE not done due to telemedicine visit to reduce risk of COVID in patient with COVID 19 PNA  Labs    High Sensitivity Troponin:  No results for input(s): TROPONINIHS in the last 720 hours.    Chemistry Recent Labs  Lab 06/14/20 0447 06/15/20 0454 06/16/20 0351  NA 138 139 137  K 3.2* 3.6 3.5  CL 100 102 99  CO2 21* 20* 19*  GLUCOSE 164* 148* 187*  BUN 58* 78* 95*  CREATININE 5.47* 6.42* 6.78*  CALCIUM 7.3* 6.6* 6.6*  PROT 5.0* 4.3* 4.3*  ALBUMIN 2.1* 1.8* 1.9*  AST 23 31 25   ALT 13 18 20   ALKPHOS 71 74 70  BILITOT 1.1 0.7 0.8  GFRNONAA 11* 9* 8*  ANIONGAP 17* 17* 19*     Hematology Recent Labs  Lab 06/14/20 0447 06/15/20 0454 06/16/20 0706  WBC 8.3 8.2 7.7  RBC 4.22 4.25 4.72  HGB 11.6* 11.7*  12.6*  HCT 37.0* 37.0* 41.3  MCV 87.7 87.1 87.5  MCH 27.5 27.5 26.7  MCHC 31.4 31.6 30.5  RDW 17.6* 17.4* 17.5*  PLT 102* 117* 109*    BNP Recent Labs  Lab 06/11/20 1326  BNP >4,500.0*     DDimer  Recent Labs  Lab 06/14/20 0447 06/15/20 0454 06/16/20 0351  DDIMER 2.38* 2.05* 2.95*     CHA2DS2-VASc Score =    This indicates a  % annual risk of stroke. The patient's score is based upon:        Radiology    No results found.  Cardiac Studies   2D echo Echocardiogram 15-Jun-2020: Impression: 1. Limited study to assess LV function; not all views obtained.  2. Left ventricular ejection fraction, by estimation, is 25 to 30%. The  left ventricle has severely decreased function. The left ventricle  demonstrates global hypokinesis. Left ventricular diastolic parameters are  consistent with Grade II diastolic  dysfunction (pseudonormalization).  3. Right ventricular systolic function is mildly reduced. The right  ventricular size is normal. There is normal pulmonary artery systolic  pressure.  4. Left  atrial size was moderately dilated.  5. Right atrial size was mildly dilated.  6. Moderate pleural effusion in the left lateral region.  7. The mitral valve is normal in structure. Mild mitral valve  regurgitation. No evidence of mitral stenosis.  8. The aortic valve is tricuspid. Aortic valve regurgitation is mild.  Mild to moderate aortic valve sclerosis/calcification is present, without  any evidence of aortic stenosis.  9. The inferior vena cava is normal in size with greater than 50%  respiratory variability, suggesting right atrial pressure of 3 mmHg.   Patient Profile     65 y.o. male with a historyof paroxysmal atrial fibrillation on Eliquis, PAD s/p bilateral BKA, mild aortic stenosis on Echo in 01/2019, CVA, hypertension, type 1 diabetes mellitus, ESRD with failed renal transplant on hemodialysis on T/Th/Sat, and duodenal cancer s/p resectionwho is being seen for the evaluation ofatrial fibrillationat the request ofDr. Posey Pronto.  Assessment & Plan    Paroxysmal Atrial Fibrillation with RVR Pt presented in afib with RVR  Placed on IV dilt  COnverted    This was stopped   Lopressor put on hold   WIll resume at 12.5 bid - On Eliquis 5mg  twice daily at home>>was placed on IV Heparin on admit>>ok to change back to Eliquis 5mg  BID  Acute on Chronic Combined CHF - BNP >4,500.  - Chest x-ray showed diffuse interstitial prominence with streaky interstitial opacities within the mid to lower lung fields and small left pleural effusion.  - Echo showed LVEF of 25-30% with global hypokinesis and grade 2 diastolic dysfunction. EF down from 60-65% in 01/2019. - Volume status managed via dialysis. - Etiology possibly tachy-mediated given reports of intermittent palpitations for the last several weeks however, ischemic cardiomyopathy also on the differential as well as COVID 19 CM. Will start GDMT and plan to repeat Echo in 3 months. If EF still low, will need ischemic evaluation. -  yesterday started Hydralazine 10mg  three times daily and Imdur 15mg  daily. - No ARNI or MRA given renal function. Low dose toprol XL  PAD - S/p bilateral BKA. - Continue aspirin and statin.  Hypertension - BP currently well controlled at 116/72mmHg - home amlodipine stopped to  maximize GDMT for CHF. - Started Hydralazine 10mg  TID and Imdur 15mg  daily - Add  Toprol XL 25.  ESRD  - S/p failed renal transplant on  hemodialysis on T/Th/Sat. - Management per Nephrology.  Otherwise, per primary team: - COVID pneumonia  - Type 1 diabetes      For questions or updates, please contact [facility] Please consult [email or web address] for contact info under        Signed, Dorris Carnes, MD  06/16/2020, 11:03 AM

## 2020-06-16 NOTE — Progress Notes (Signed)
[facility] Progress Note   Subjective: HD earlier today tolerated without issues. Minimal UF. No C/Os.     Objective Vitals:   06/16/20 1000 06/16/20 1038 06/16/20 1045 06/16/20 1128  BP: 124/61 137/70 126/60 120/64  Pulse:    (!) 58  Resp:  12 15 18   Temp:   97.7 F (36.5 C) 97.8 F (36.6 C)  TempSrc:   Axillary   SpO2:  100%  99%  Weight:  62.1 kg    Height:       Physical Exam General: Chronically ill appearing male in NAD Heart: S1,S2 RRR No M/R/G Lungs: CTAB Abdomen: S, NT, active BS Extremities: Bilateral BKA no stump edema Dialysis Access: L AVF + bruit.   Additional Objective Labs: Basic Metabolic Panel: Recent Labs  Lab 06/14/20 0447 06/15/20 0454 06/16/20 0351  NA 138 139 137  K 3.2* 3.6 3.5  CL 100 102 99  CO2 21* 20* 19*  GLUCOSE 164* 148* 187*  BUN 58* 78* 95*  CREATININE 5.47* 6.42* 6.78*  CALCIUM 7.3* 6.6* 6.6*   Liver Function Tests: Recent Labs  Lab 06/14/20 0447 06/15/20 0454 06/16/20 0351  AST 23 31 25   ALT 13 18 20   ALKPHOS 71 74 70  BILITOT 1.1 0.7 0.8  PROT 5.0* 4.3* 4.3*  ALBUMIN 2.1* 1.8* 1.9*   No results for input(s): LIPASE, AMYLASE in the last 168 hours. CBC: Recent Labs  Lab 06/12/20 0907 06/12/20 1026 06/13/20 0500 06/14/20 0447 06/15/20 0454 06/16/20 0706  WBC 4.4  --  7.5 8.3 8.2 7.7  NEUTROABS 3.5  --  6.1 7.3 6.8 6.5  HGB 11.2*  --  10.6* 11.6* 11.7* 12.6*  HCT 36.6*   < > 36.2* 37.0* 37.0* 41.3  MCV 89.9  --  91.0 87.7 87.1 87.5  PLT 85*  --  106* 102* 117* 109*   < > = values in this interval not displayed.   Blood Culture    Component Value Date/Time   SDES BLOOD LEFT HAND 06/13/2020 0803   SDES BLOOD LEFT HAND 06/13/2020 0803   SPECREQUEST  06/13/2020 0803    BOTTLES DRAWN AEROBIC AND ANAEROBIC Blood Culture adequate volume   SPECREQUEST  06/13/2020 0803    BOTTLES DRAWN AEROBIC AND ANAEROBIC Blood Culture adequate volume   CULT  06/13/2020 0803    NO GROWTH 3 DAYS Performed  at [facility], [place] [address], [place], [place] [address]    CULT  06/13/2020 0803    NO GROWTH 3 DAYS Performed at [facility] [address], [place],  [address]    REPTSTATUS PENDING 06/13/2020 0803   REPTSTATUS PENDING 06/13/2020 0803    Cardiac Enzymes: No results for input(s): CKTOTAL, CKMB, CKMBINDEX, TROPONINI in the last 168 hours. CBG: Recent Labs  Lab 06/15/20 1118 06/15/20 1621 06/15/20 2013 06/16/20 0638 06/16/20 1129  GLUCAP 206* 147* 124* 184* 202*   Iron Studies:  Recent Labs    06/14/20 0447  FERRITIN 322   @[email or web address] @ Studies/Results: No results found. Medications: . sodium chloride    . Vancomycin    . vancomycin    . vancomycin 750 mg (06/13/20 1118)   . apixaban  5 mg Oral BID  . aspirin  81 mg Oral Daily  . atorvastatin  10 mg Oral Q M,W,F  . bisacodyl  10 mg Oral Daily  . Darbepoetin Alfa      . [START ON 06/22/2020] Darbepoetin Alfa  60 mcg Intravenous Q Thu-HD  .  docusate sodium  100 mg Oral BID  . hydrALAZINE  10 mg Oral TID  . insulin aspart  0-20 Units Subcutaneous TID WC  . insulin glargine  10 Units Subcutaneous Daily  . isosorbide mononitrate  15 mg Oral Daily  . lipase/protease/amylase  12,000 Units Oral TID AC  . magnesium chloride  2 tablet Oral BID  . pantoprazole  80 mg Oral Daily  . polyethylene glycol  17 g Oral Daily  . predniSONE  50 mg Oral Daily  . sertraline  50 mg Oral QHS  . sodium chloride flush  3 mL Intravenous Q12H  . tacrolimus  2 mg Oral BID  . tamsulosin  0.4 mg Oral QPC supper     OP HD: TTS NW  3.5h   66kh  3K/2.25 bath  RFA AVF  Hep none  - hect 2 ug  - mircera 200 q2 , last 1/13   CXR 1/23 - IMPRESSION: Diffuse interstitial prominence with streaky interstitial opacities within the mid to lower lung fields and small left pleural effusion. Findings may reflect multifocal atypical/viral infection versus pulmonary edema.  Assessment/ Plan: 1. COVID-19 pneumonia: With O2  requirement. Per admitting team. Now off isolation.  2. Tachycardia/A-fib RVR: off cardizem, converted to sinus bradycardia. Cards following.   3.  A/C combined CHF: adding low dose ARB per cardiology. LVEF 25-30% here, G2DD. On hydralazine and imdur also. No BB d/t bradycardia.  4.  Hypoxemia: on 2 L [place], possibly some edema by CXR in addition to infiltrates.  5. ESRD:Usual TTS schedule - missed HD Sat 1/22. Missed HD 01/26 and will missed HD again 01/27 due to high census/ inadequate staff. HD today. Short HD again tomorrow to get back on schedule.  6. Hypertension/volume:BP controlled post HD. No volume excess. Very much under EDW. Will need lower EDW at Dc.  7. Anemia:Hgb > 12, no ESA for now. 8. Metabolic bone disease:CorrCa ok, Phos pending. Resume home binders (Renvela). 9. T1DM: Insulin per primary  Mende Biswell H. Elika Godar NP-C 06/16/2020, 12:17 PM  [employer] [phone]

## 2020-06-16 NOTE — Progress Notes (Signed)
Patient's temp 95.8,rectally BP 128/73 HR 48 O2 sat 100% on 1L/[place]. Patient denies any c/o. No acute distress. MD,Shalhoub notified via secure chat. 06:50 Call received from Shalhoub,MD  to place warm blanket on patient for now. Will continue to monitor. Incoming RN made aware.

## 2020-06-16 NOTE — Progress Notes (Addendum)
[facility] Progress Note  Patient: Paul [name]    DUK:[id number]  DOA: 06/11/2020     Date of Service: the patient was seen and examined on 06/16/2020  Brief hospital course: 65 year old with history of paroxysmal A. fib, PAD, ESRD with failed renal transplant on HD TTS, DM2, diastolic CHF presented with complaints of shortness of breath after missing dialysis.  COVID-19 positive.  Chest x-ray showed multifocal opacities with left sided pleural effusion.  He was found to have COVID-19 pneumonia, atrial fibrillation with RVR and concerns of CHF exacerbation. Blood cultures growing Enterococcus started on vancomycin.  Infectious disease consulted.   Currently plan is continue antibiotics.  Assessment and Plan: Acute respiratory distress Acute hypoxic respiratory failure, POA 88% on room air on admission Multiple etiology given underlying COVID-19 pneumonia, RVR, fluid overload from missing dialysis.   Continue supplemental oxygen as needed. Likely can come off the oxygen, continue incentive spirometry  Enterococcus bacteremia Sepsis POA Potential UA as a source. Met SIRS criteria on admission with tachycardia tachypnea as well as fever and hypoxia. IV vancomycin started ID following 2D echocardiogram no vegetation. EF 25-30%.  Surveillance cultures ordered 1/25, so far negative. ID recommends 2 weeks of IV antibiotics.  No TEE.  COVID-19 pneumonia Chest x-ray showing bilateral opacities.  Pulmonary edema Treated Solu-Medrol, Remdesivir, Procalcitonin-0.44 BNP- >4500 Incentive spirometer, flutter valve First positive test on 06/11/2020.  Out of isolation on 07/24/6281  Acute metabolic encephalopathy-resolved No acute findings but it shows interval lacunar infarct? On aspirin and statin LDL 64, A1c 7.5 UA-positive TSH, B12 and folate normal  ESRD on hemodialysis, failed renal transplant.  TTS Seen by nephrology.  HD plans for nephrology  Acute systolic CHF on chronic  diastolic CHF. Echo this admission EF 25%. Echo 01/2019-EF 65%.   Presented with volume overload. Cardiology consulted due to acute drop in the EF which is likely secondary to tachycardia as well as sepsis. Cardiology will initiate GDMT, repeat follow-up echocardiogram in the clinic. Ischemic work-up outpatient if no improvement in EF. Appreciate assistance.  Paroxysmal atrial fibrillation with RVR On Norvasc and Lopressor. Ziio patch was ordered outpatient.  On home Eliquis. Currently on cardioprotective medication for low EF. Initially required Cardizem drip.  Diabetes mellitus type 2, mild hyperglycemia secondary to steroid use Increase Lantus 10 units daily.  Insulin sliding scale and Accu-Chek.  Essential hypertension Norvasc 10 mg daily.  Metoprolol 25 mg twice daily  GERD PPI  Depression Zoloft at bedtime  BPH Flomax  Hypothermic episode on 1/27 night. Currently resolved.  Monitor.  Diet: Renal diet dysphagia 2 nectar thick DVT Prophylaxis:    apixaban (ELIQUIS) tablet 5 mg    Advance goals of care discussion: Full code  Family Communication: no family was present at bedside, at the time of interview.  Discussed with wife on the phone on 1/28  Disposition:  Status is: Inpatient  Remains inpatient appropriate because:IV treatments appropriate due to intensity of illness or inability to take PO  Dispo: The patient is from: Home              Anticipated d/c is to: Home              Anticipated d/c date is: 2 days              Patient currently is not medically stable to d/c.   Difficult to place patient No  Subjective: Seen at hemodialysis.  No acute complaints or no acute events overnight.  Later in the  day become more tired and fatigued.  Physical Exam:  General: Appear in mild distress, no Rash; Oral Mucosa Clear, moist. no Abnormal Neck Mass Or lumps, Conjunctiva normal  Cardiovascular: S1 and S2 Present, no Murmur, Respiratory: Good respiratory  effort, Bilateral Air entry present and bilateral  Crackles, no wheezes Abdomen: Bowel Sound present, Soft and no tenderness Extremities: Bilateral BKA. Neurology: alert and oriented to time, place, and person affect appropriate. no new focal deficit Gait not checked due to patient safety concerns    Vitals:   06/16/20 1038 06/16/20 1045 06/16/20 1128 06/16/20 1705  BP: 137/70 126/60 120/64 108/67  Pulse:   (!) 58 67  Resp: $Remo'12 15 18 17  'zQWHh$ Temp:  97.7 F (36.5 C) 97.8 F (36.6 C) (!) 97.5 F (36.4 C)  TempSrc:  Axillary  Oral  SpO2: 100%  99% 96%  Weight: 62.1 kg     Height:        Intake/Output Summary (Last 24 hours) at 06/16/2020 2055 Last data filed at 06/16/2020 1700 Gross per 24 hour  Intake 540 ml  Output 625 ml  Net -85 ml   Filed Weights   06/11/20 2300 06/16/20 0745 06/16/20 1038  Weight: 72.9 kg 63.5 kg 62.1 kg    Data Reviewed: I have personally reviewed and interpreted daily labs, tele strips, imaging. I reviewed all nursing notes, pharmacy notes, vitals, pertinent old records I have discussed plan of care as described above with RN and patient/family.  CBC: Recent Labs  Lab 06/12/20 0907 06/12/20 1026 06/13/20 0500 06/14/20 0447 06/15/20 0454 06/16/20 0706  WBC 4.4  --  7.5 8.3 8.2 7.7  NEUTROABS 3.5  --  6.1 7.3 6.8 6.5  HGB 11.2*  --  10.6* 11.6* 11.7* 12.6*  HCT 36.6* 35.3* 36.2* 37.0* 37.0* 41.3  MCV 89.9  --  91.0 87.7 87.1 87.5  PLT 85*  --  106* 102* 117* 500*   Basic Metabolic Panel: Recent Labs  Lab 06/12/20 0907 06/13/20 0500 06/14/20 0447 06/15/20 0454 06/16/20 0351  NA 140 137 138 139 137  K 3.9 3.8 3.2* 3.6 3.5  CL 103 98 100 102 99  CO2 22 20* 21* 20* 19*  GLUCOSE 271* 158* 164* 148* 187*  BUN 49* 66* 58* 78* 95*  CREATININE 6.37* 6.91* 5.47* 6.42* 6.78*  CALCIUM 7.9* 7.6* 7.3* 6.6* 6.6*  MG  --  2.2 2.2 2.1 2.2    Studies: No results found.  Scheduled Meds: . apixaban  5 mg Oral BID  . aspirin  81 mg Oral Daily  .  atorvastatin  10 mg Oral Q M,W,F  . bisacodyl  10 mg Oral Daily  . [START ON 06/17/2020] Chlorhexidine Gluconate Cloth  6 each Topical Q0600  . docusate sodium  100 mg Oral BID  . hydrALAZINE  10 mg Oral TID  . insulin aspart  0-20 Units Subcutaneous TID WC  . insulin glargine  10 Units Subcutaneous Daily  . isosorbide mononitrate  15 mg Oral Daily  . lipase/protease/amylase  12,000 Units Oral TID AC  . magnesium chloride  2 tablet Oral BID  . [START ON 06/17/2020] metoprolol succinate  25 mg Oral Daily  . pantoprazole  80 mg Oral Daily  . polyethylene glycol  17 g Oral Daily  . predniSONE  50 mg Oral Daily  . sertraline  50 mg Oral QHS  . sodium chloride flush  3 mL Intravenous Q12H  . tacrolimus  2 mg Oral BID  . tamsulosin  0.4  mg Oral QPC supper   Continuous Infusions: . sodium chloride    . vancomycin 750 mg (06/13/20 1118)   PRN Meds: sodium chloride, acetaminophen, chlorpheniramine-HYDROcodone, guaiFENesin-dextromethorphan, Ipratropium-Albuterol, Resource ThickenUp Clear, sodium chloride flush, traMADol  Time spent: 35 minutes  Author: Berle Mull, MD [facility] 06/16/2020 8:55 PM  To reach On-call, see care teams to locate the attending and reach out via [email or web address]. Between 7PM-7AM, please contact night-coverage If you still have difficulty reaching the attending provider, please page the [facility] (Director on Call) for [facility] on amion for assistance.

## 2020-06-16 NOTE — Progress Notes (Signed)
HOSPITAL MEDICINE OVERNIGHT EVENT NOTE    Notified by nursing that MEWS is yellow.    Patient is currently admitted for respiratory distress, multifactorial secondary to Covid as well as volume overload.  Patient is also suffering from Enterococcus bacteremia thought to be secondary to UTI.  Patient is slightly hypothermic at this point with sinus bradycardia and heart rate in the upper 40s.  Patient is awake and alert and oriented x3 however with no complaints.  We will avoid active rewarming at this point.  I have advised nursing to pursue passive rewarming with application of multiple blankets entering at the temperature in the room.  Continuing current treatment strategy with current antibiotics and continued monitoring.  If patient's hypothermia worsens will consider active rewarming measures.  Paul Emerald  MD [facility]

## 2020-06-16 NOTE — Progress Notes (Signed)
PT Cancellation Note  Patient Details Name: Paul [name] MRN: [id number] DOB: 1956/03/06   Cancelled Treatment:    Reason Eval/Treat Not Completed: Patient declined, no reason specified politely declines PT, doesn't seem to be feeling well after HD. Will continue to follow acutely.    [name], DPT, PN1   Supplemental Physical Therapist [facility]    Pager [phone] Acute Rehab Office [phone]

## 2020-06-17 DIAGNOSIS — I5021 Acute systolic (congestive) heart failure: Secondary | ICD-10-CM

## 2020-06-17 DIAGNOSIS — U071 COVID-19: Secondary | ICD-10-CM | POA: Diagnosis not present

## 2020-06-17 DIAGNOSIS — I480 Paroxysmal atrial fibrillation: Secondary | ICD-10-CM | POA: Diagnosis not present

## 2020-06-17 DIAGNOSIS — N186 End stage renal disease: Secondary | ICD-10-CM | POA: Diagnosis not present

## 2020-06-17 DIAGNOSIS — R001 Bradycardia, unspecified: Secondary | ICD-10-CM | POA: Diagnosis not present

## 2020-06-17 DIAGNOSIS — I420 Dilated cardiomyopathy: Secondary | ICD-10-CM | POA: Diagnosis not present

## 2020-06-17 DIAGNOSIS — I4892 Unspecified atrial flutter: Secondary | ICD-10-CM

## 2020-06-17 LAB — CBC
HCT: 41.5 % (ref 39.0–52.0)
Hemoglobin: 12.6 g/dL — ABNORMAL LOW (ref 13.0–17.0)
MCH: 26 pg (ref 26.0–34.0)
MCHC: 30.4 g/dL (ref 30.0–36.0)
MCV: 85.7 fL (ref 80.0–100.0)
Platelets: 117 10*3/uL — ABNORMAL LOW (ref 150–400)
RBC: 4.84 MIL/uL (ref 4.22–5.81)
RDW: 18.1 % — ABNORMAL HIGH (ref 11.5–15.5)
WBC: 10.6 10*3/uL — ABNORMAL HIGH (ref 4.0–10.5)
nRBC: 0.2 % (ref 0.0–0.2)

## 2020-06-17 LAB — BASIC METABOLIC PANEL
Anion gap: 15 (ref 5–15)
BUN: 66 mg/dL — ABNORMAL HIGH (ref 8–23)
CO2: 19 mmol/L — ABNORMAL LOW (ref 22–32)
Calcium: 6.8 mg/dL — ABNORMAL LOW (ref 8.9–10.3)
Chloride: 101 mmol/L (ref 98–111)
Creatinine, Ser: 4.97 mg/dL — ABNORMAL HIGH (ref 0.61–1.24)
GFR, Estimated: 12 mL/min — ABNORMAL LOW (ref >60)
Glucose, Bld: 186 mg/dL — ABNORMAL HIGH (ref 70–99)
Potassium: 3.4 mmol/L — ABNORMAL LOW (ref 3.5–5.1)
Sodium: 135 mmol/L (ref 135–145)

## 2020-06-17 LAB — GLUCOSE, CAPILLARY
Glucose-Capillary: 169 mg/dL — ABNORMAL HIGH (ref 70–99)
Glucose-Capillary: 120 mg/dL — ABNORMAL HIGH (ref 70–99)
Glucose-Capillary: 220 mg/dL — ABNORMAL HIGH (ref 70–99)
Glucose-Capillary: 248 mg/dL — ABNORMAL HIGH (ref 70–99)

## 2020-06-17 LAB — MAGNESIUM: Magnesium: 2 mg/dL (ref 1.7–2.4)

## 2020-06-17 LAB — C-REACTIVE PROTEIN: CRP: 2.6 mg/dL — ABNORMAL HIGH (ref <1.0)

## 2020-06-17 LAB — D-DIMER, QUANTITATIVE: D-Dimer, Quant: 3.73 ug/mL-FEU — ABNORMAL HIGH (ref 0.00–0.50)

## 2020-06-17 MED ORDER — VANCOMYCIN HCL IN DEXTROSE 500-5 MG/100ML-% IV SOLN
500.0000 mg | Freq: Once | INTRAVENOUS | Status: AC
  Administered 2020-06-17: 500 mg via INTRAVENOUS
  Filled 2020-06-17: qty 100

## 2020-06-17 NOTE — Progress Notes (Signed)
Progress Note  Patient Name: Paul [name] Date of Encounter: 06/17/2020  [facility] Cardiologist: Evalina Field, MD   Subjective   Breathing clearly better after HD. No orthopnea. Maintaining NSR. Tolerating hydralazine-nitrates.  Inpatient Medications    Scheduled Meds: . apixaban  5 mg Oral BID  . aspirin  81 mg Oral Daily  . atorvastatin  10 mg Oral Q M,W,F  . bisacodyl  10 mg Oral Daily  . Chlorhexidine Gluconate Cloth  6 each Topical Q0600  . docusate sodium  100 mg Oral BID  . hydrALAZINE  10 mg Oral TID  . insulin aspart  0-20 Units Subcutaneous TID WC  . insulin glargine  10 Units Subcutaneous Daily  . isosorbide mononitrate  15 mg Oral Daily  . lipase/protease/amylase  12,000 Units Oral TID AC  . magnesium chloride  2 tablet Oral BID  . metoprolol succinate  25 mg Oral Daily  . pantoprazole  80 mg Oral Daily  . polyethylene glycol  17 g Oral Daily  . predniSONE  50 mg Oral Daily  . sertraline  50 mg Oral QHS  . sodium chloride flush  3 mL Intravenous Q12H  . tacrolimus  2 mg Oral BID  . tamsulosin  0.4 mg Oral QPC supper  . vancomycin  500 mg Intravenous Once   Continuous Infusions: . sodium chloride     PRN Meds: sodium chloride, acetaminophen, chlorpheniramine-HYDROcodone, guaiFENesin-dextromethorphan, Ipratropium-Albuterol, Resource ThickenUp Clear, sodium chloride flush, traMADol   Vital Signs    Vitals:   06/16/20 1705 06/16/20 2142 06/17/20 0340 06/17/20 0819  BP: 108/67 113/65 130/70 135/71  Pulse: 67 62 (!) 59 (!) 59  Resp: 17 18  17   Temp: (!) 97.5 F (36.4 C) 97.6 F (36.4 C) 97.8 F (36.6 C) 97.6 F (36.4 C)  TempSrc: Oral  Oral   SpO2: 96% 97% 94% 97%  Weight:      Height:        Intake/Output Summary (Last 24 hours) at 06/17/2020 1137 Last data filed at 06/17/2020 0600 Gross per 24 hour  Intake 600 ml  Output 100 ml  Net 500 ml   Last 3 Weights 06/16/2020 06/16/2020 06/11/2020  Weight (lbs) 136 lb 14.5 oz 139 lb 15.9 oz  160 lb 11.5 oz  Weight (kg) 62.1 kg 63.5 kg 72.9 kg      Telemetry    NSR - Personally Reviewed  ECG    Atrial flutter w 2:1 AVB - Personally Reviewed  Physical Exam  No exam due to COVID 19 restrictions  Labs    High Sensitivity Troponin:  No results for input(s): TROPONINIHS in the last 720 hours.    Chemistry Recent Labs  Lab 06/14/20 0447 06/15/20 0454 06/16/20 0351 06/17/20 0745  NA 138 139 137 135  K 3.2* 3.6 3.5 3.4*  CL 100 102 99 101  CO2 21* 20* 19* 19*  GLUCOSE 164* 148* 187* 186*  BUN 58* 78* 95* 66*  CREATININE 5.47* 6.42* 6.78* 4.97*  CALCIUM 7.3* 6.6* 6.6* 6.8*  PROT 5.0* 4.3* 4.3*  --   ALBUMIN 2.1* 1.8* 1.9*  --   AST 23 31 25   --   ALT 13 18 20   --   ALKPHOS 71 74 70  --   BILITOT 1.1 0.7 0.8  --   GFRNONAA 11* 9* 8* 12*  ANIONGAP 17* 17* 19* 15     Hematology Recent Labs  Lab 06/15/20 0454 06/16/20 0706 06/17/20 0745  WBC 8.2 7.7 10.6*  RBC 4.25 4.72 4.84  HGB 11.7* 12.6* 12.6*  HCT 37.0* 41.3 41.5  MCV 87.1 87.5 85.7  MCH 27.5 26.7 26.0  MCHC 31.6 30.5 30.4  RDW 17.4* 17.5* 18.1*  PLT 117* 109* 117*    BNP Recent Labs  Lab 06/11/20 1326  BNP >4,500.0*     DDimer  Recent Labs  Lab 06/15/20 0454 06/16/20 0351 06/17/20 0745  DDIMER 2.05* 2.95* 3.73*     Radiology    No results found.  Cardiac Studies   2D echo Echocardiogram 07-07-2020: Impression: 1. Limited study to assess LV function; not all views obtained.  2. Left ventricular ejection fraction, by estimation, is 25 to 30%. The  left ventricle has severely decreased function. The left ventricle  demonstrates global hypokinesis. Left ventricular diastolic parameters are  consistent with Grade II diastolic  dysfunction (pseudonormalization).  3. Right ventricular systolic function is mildly reduced. The right  ventricular size is normal. There is normal pulmonary artery systolic  pressure.  4. Left atrial size was moderately dilated.  5. Right  atrial size was mildly dilated.  6. Moderate pleural effusion in the left lateral region.  7. The mitral valve is normal in structure. Mild mitral valve  regurgitation. No evidence of mitral stenosis.  8. The aortic valve is tricuspid. Aortic valve regurgitation is mild.  Mild to moderate aortic valve sclerosis/calcification is present, without  any evidence of aortic stenosis.  9. The inferior vena cava is normal in size with greater than 50%  respiratory variability, suggesting right atrial pressure of 3 mmHg.  01/21/2019 nuclear stress test  Nuclear stress EF: 46%.  There was no ST segment deviation noted during stress.  The left ventricular ejection fraction is mildly decreased (45-54%).  Mild wall motion abnormalities of anterior and anterolateral wall on this imaging. Normal perfusion in this area. Would consider echo for formal evaluation of wall motion and EF.  No prior study for comparison.     Patient Profile     65 y.o. male with a historyof paroxysmal atrial fibrillation on Eliquis, PAD s/p bilateral BKA, mild aortic stenosis on Echo in 01/2019, CVA, hypertension, type 1 diabetes mellitus, ESRD with failed renal transplant on hemodialysis onT/Th/Sat, and duodenal cancer s/p resectionwho is being seen for the evaluation ofatrial fibrillation and newly depressed LVEF.  Assessment & Plan    Atrial Flutter with RVR AFlutter this admission, had atrial fibrillation in August 2021 On beta blocker and Eliquis. CHADSVasc 6, +ve hx of CVA.  Acute on Chronic Combined CHF Symptoms better after volume removal w HD. LVEFof 25-30% with global hypokinesis and grade 2 diastolic dysfunction.EF down from 60-65% in 01/2019. Nonischemic nuclear scan in 2020. Consider tachy-mediated cardiomyopathy (reports intermittent palpitations for the last several weeks), but ischemic cardiomyopathy (balanced ischemia?) and COVID 19 CMP in differential.  Increase vasodilators (no RAAS  inhibitors given renal function). Low dose beta blocker started.  PAD S/p bilateral BKA. On aspirin and statin. Increases likelihood of CAD.  ESRD  S/p failed renal transplant on hemodialysis on T/Th/Sat.  AS Mild by echo  Otherwise, per primary team: - COVID pneumonia  - Type 1 diabetes      For questions or updates, please contact [facility] Please consult [email or web address] for contact info under        Signed, Sanda Klein, MD  06/17/2020, 11:37 AM

## 2020-06-17 NOTE — Progress Notes (Signed)
[place], RN called and notified pt's HD treatment has been moved to 06/18/2020.

## 2020-06-17 NOTE — Progress Notes (Signed)
[facility] Progress Note   Subjective: no c/o, seen in room.      Objective Vitals:   06/16/20 1705 06/16/20 2142 06/17/20 0340 06/17/20 0819  BP: 108/67 113/65 130/70 135/71  Pulse: 67 62 (!) 59 (!) 59  Resp: 17 18  17   Temp: (!) 97.5 F (36.4 C) 97.6 F (36.4 C) 97.8 F (36.6 C) 97.6 F (36.4 C)  TempSrc: Oral  Oral   SpO2: 96% 97% 94% 97%  Weight:      Height:       Physical Exam General: Chronically ill appearing male in NAD Heart: S1,S2 RRR No M/R/G Lungs: CTAB Abdomen: S, NT, active BS Extremities: Bilateral BKA no stump edema Dialysis Access: L AVF + bruit.   Additional Objective Labs: Basic Metabolic Panel: Recent Labs  Lab 06/15/20 0454 06/16/20 0351 06/17/20 0745  NA 139 137 135  K 3.6 3.5 3.4*  CL 102 99 101  CO2 20* 19* 19*  GLUCOSE 148* 187* 186*  BUN 78* 95* 66*  CREATININE 6.42* 6.78* 4.97*  CALCIUM 6.6* 6.6* 6.8*   Liver Function Tests: Recent Labs  Lab 06/14/20 0447 06/15/20 0454 06/16/20 0351  AST 23 31 25   ALT 13 18 20   ALKPHOS 71 74 70  BILITOT 1.1 0.7 0.8  PROT 5.0* 4.3* 4.3*  ALBUMIN 2.1* 1.8* 1.9*   No results for input(s): LIPASE, AMYLASE in the last 168 hours. CBC: Recent Labs  Lab 06/13/20 0500 06/14/20 0447 06/15/20 0454 06/16/20 0706 06/17/20 0745  WBC 7.5 8.3 8.2 7.7 10.6*  NEUTROABS 6.1 7.3 6.8 6.5  --   HGB 10.6* 11.6* 11.7* 12.6* 12.6*  HCT 36.2* 37.0* 37.0* 41.3 41.5  MCV 91.0 87.7 87.1 87.5 85.7  PLT 106* 102* 117* 109* 117*   Blood Culture    Component Value Date/Time   SDES BLOOD LEFT HAND 06/13/2020 0803   SDES BLOOD LEFT HAND 06/13/2020 0803   SPECREQUEST  06/13/2020 0803    BOTTLES DRAWN AEROBIC AND ANAEROBIC Blood Culture adequate volume   SPECREQUEST  06/13/2020 0803    BOTTLES DRAWN AEROBIC AND ANAEROBIC Blood Culture adequate volume   CULT  06/13/2020 0803    NO GROWTH 3 DAYS Performed at [facility], [place] [address] [place], [place] [address]    CULT  06/13/2020  0803    NO GROWTH 3 DAYS Performed at [facility] [address], [place], [place] [address]    REPTSTATUS PENDING 06/13/2020 0803   REPTSTATUS PENDING 06/13/2020 0803    Cardiac Enzymes: No results for input(s): CKTOTAL, CKMB, CKMBINDEX, TROPONINI in the last 168 hours. CBG: Recent Labs  Lab 06/16/20 0638 06/16/20 1129 06/16/20 1647 06/16/20 2140 06/17/20 0630  GLUCAP 184* 202* 180* 183* 169*   Iron Studies:  No results for input(s): IRON, TIBC, TRANSFERRIN, FERRITIN in the last 72 hours. @lablastinr3 @ Studies/Results: No results found. Medications: . sodium chloride     . apixaban  5 mg Oral BID  . aspirin  81 mg Oral Daily  . atorvastatin  10 mg Oral Q M,W,F  . bisacodyl  10 mg Oral Daily  . Chlorhexidine Gluconate Cloth  6 each Topical Q0600  . docusate sodium  100 mg Oral BID  . hydrALAZINE  10 mg Oral TID  . insulin aspart  0-20 Units Subcutaneous TID WC  . insulin glargine  10 Units Subcutaneous Daily  . isosorbide mononitrate  15 mg Oral Daily  . lipase/protease/amylase  12,000 Units Oral TID AC  .  magnesium chloride  2 tablet Oral BID  . metoprolol succinate  25 mg Oral Daily  . pantoprazole  80 mg Oral Daily  . polyethylene glycol  17 g Oral Daily  . predniSONE  50 mg Oral Daily  . sertraline  50 mg Oral QHS  . sodium chloride flush  3 mL Intravenous Q12H  . tacrolimus  2 mg Oral BID  . tamsulosin  0.4 mg Oral QPC supper  . vancomycin  500 mg Intravenous Once     OP HD: TTS NW  3.5h   66kh  3K/2.25 bath  RFA AVF  Hep none  - hect 2 ug  - mircera 200 q2 , last 1/13   CXR 1/23 - IMPRESSION: Diffuse interstitial prominence with streaky interstitial opacities within the mid to lower lung fields and small left pleural effusion. Findings may reflect multifocal atypical/viral infection versus pulmonary edema.  Assessment/ Plan: 1. COVID-19 pneumonia: With O2 requirement. Per admitting team. Now off isolation.  2. Tachycardia/A-fib RVR: off  cardizem, converted to sinus bradycardia. Cards following.   3.  A/C combined CHF: low dose ARB per cardiology. LVEF 25-30% here, G2DD. On hydralazine and imdur also. No BB d/t bradycardia.  4.  Hypoxemia: on 2 L ,admission + IS opacities by CXR. Per pmd.  5. ESRD:Usual TTS schedule - missed OP HD Sat 1/22. Had HD here on 1/24 and 1/28. Labs in reasonable range, no uremic signs. Orders in  for HD today.  6. Hypertension/volume:BP controlled post HD. Euvolemic now. Very much under EDW. Will need lower EDW at Dc.  7. Anemia:Hgb > 12, no ESA for now. 8. Metabolic bone disease:CorrCa ok, Phos pending. Resume home binders (Renvela). 9. T1DM: Insulin per primary   Paul Splinter  MD 06/17/2020, 12:56 PM

## 2020-06-17 NOTE — Progress Notes (Signed)
[facility] Progress Note  Patient: Paul [name]    GEX:[id number]  DOA: 06/11/2020     Date of Service: the patient was seen and examined on 06/17/2020  Brief hospital course: 65 year old with history of paroxysmal A. fib, PAD, ESRD with failed renal transplant on HD TTS, DM2, diastolic CHF presented with complaints of shortness of breath after missing dialysis.  COVID-19 positive.  Chest x-ray showed multifocal opacities with left sided pleural effusion.  He was found to have COVID-19 pneumonia, atrial fibrillation with RVR and concerns of CHF exacerbation. Blood cultures growing Enterococcus started on vancomycin.  Infectious disease consulted.   Currently plan is continue antibiotics.  Assessment and Plan: Acute respiratory distress Acute hypoxic respiratory failure, POA 88% on room air on admission Multiple etiology given underlying COVID-19 pneumonia, RVR, fluid overload from missing dialysis.   Continue supplemental oxygen as needed. Continue incentive spirometry. Wean oxygen.  Enterococcus bacteremia Sepsis POA Potential UA as a source. Met SIRS criteria on admission with tachycardia tachypnea as well as fever and hypoxia. IV vancomycin started ID following 2D echocardiogram no vegetation. EF 25-30%.  Surveillance cultures ordered 1/25, so far negative. ID recommends 2 weeks of IV antibiotics.  No TEE.  COVID-19 pneumonia Chest x-ray showing bilateral opacities.  Pulmonary edema Treated Solu-Medrol, Remdesivir, Procalcitonin-0.44 BNP- >4500 Incentive spirometer, flutter valve First positive test on 06/11/2020.  Out of isolation on 0/05/270  Acute metabolic encephalopathy-resolved No acute findings but it shows interval lacunar infarct? On aspirin and statin LDL 64, A1c 7.5 UA-positive TSH, B12 and folate normal  ESRD on hemodialysis, failed renal transplant.  TTS Seen by nephrology.  HD plans for nephrology  Acute systolic CHF on chronic diastolic  CHF. Echo this admission EF 25%. Echo 01/2019-EF 65%.   Presented with volume overload. Cardiology consulted due to acute drop in the EF which is likely secondary to tachycardia as well as sepsis. Cardiology will initiate GDMT, Currently on aspirin, Lipitor, hydralazine, Imdur, Toprol. Mild bradycardia seen on the telemetry scan. Repeat follow-up echocardiogram in the clinic. Ischemic work-up outpatient if no improvement in EF. Appreciate assistance.  Paroxysmal atrial fibrillation with RVR Tachybradycardia syndrome On Norvasc and Lopressor. Ziio patch was ordered outpatient.  On home Eliquis. Currently on cardioprotective medication for low EF. Initially required Cardizem drip.  Diabetes mellitus type 2, mild hyperglycemia secondary to steroid use Increase Lantus 10 units daily.  Insulin sliding scale and Accu-Chek.  Essential hypertension Medication have been adjusted.  Blood pressure stable.  GERD PPI  Depression Zoloft at bedtime  BPH Flomax  Hypothermic episode on 1/27 night. Currently resolved.  Monitor.  Diet: Renal diet dysphagia 2 nectar thick DVT Prophylaxis:    apixaban (ELIQUIS) tablet 5 mg    Advance goals of care discussion: Full code  Family Communication: no family was present at bedside, at the time of interview.  Discussed with wife on the phone on 1/28  Disposition:  Status is: Inpatient  Remains inpatient appropriate because:IV treatments appropriate due to intensity of illness or inability to take PO  Dispo: The patient is from: Home              Anticipated d/c is to: Home              Anticipated d/c date is: 2 days              Patient currently is not medically stable to d/c.   Difficult to place patient No  Subjective: No nausea no vomiting.  No  fever no chills.  No chest pain.  No abdominal pain.  No more fatigue or tiredness that he experienced yesterday after hemodialysis.  Physical Exam:  General: Appear in mild distress,  no Rash; Oral Mucosa Clear, moist. no Abnormal Neck Mass Or lumps, Conjunctiva normal  Cardiovascular: S1 and S2 Present, no Murmur, Respiratory: Good respiratory effort, Bilateral Air entry present and faint bilateral crackles, no wheezes Abdomen: Bowel Sound present, Soft and no tenderness Extremities: Bilateral BKA. Neurology: alert and oriented to time, place, and person affect appropriate. no new focal deficit Gait not checked due to patient safety concerns  Vitals:   06/16/20 1705 06/16/20 2142 06/17/20 0340 06/17/20 0819  BP: 108/67 113/65 130/70 135/71  Pulse: 67 62 (!) 59 (!) 59  Resp: _0 Temp: (!) 97.5 F (36.4 C) 97.6 F (36.4 C) 97.8 F (36.6 C) 97.6 F (36.4 C)  TempSrc: Oral  Oral   SpO2: 96% 97% 94% 97%  Weight:      Height:        Intake/Output Summary (Last 24 hours) at 06/17/2020 1802 Last data filed at 06/17/2020 0600 Gross per 24 hour  Intake 120 ml  Output 100 ml  Net 20 ml   Filed Weights   06/11/20 2300 06/16/20 0745 06/16/20 1038  Weight: 72.9 kg 63.5 kg 62.1 kg    Data Reviewed: I have personally reviewed and interpreted daily labs, tele strips, imaging. I reviewed all nursing notes, pharmacy notes, vitals, pertinent old records I have discussed plan of care as described above with RN and patient/family.  CBC: Recent Labs  Lab 06/12/20 0907 06/12/20 1026 06/13/20 0500 06/14/20 0447 06/15/20 0454 06/16/20 0706 06/17/20 0745  WBC 4.4  --  7.5 8.3 8.2 7.7 10.6*  NEUTROABS 3.5  --  6.1 7.3 6.8 6.5  --   HGB 11.2*  --  10.6* 11.6* 11.7* 12.6* 12.6*  HCT 36.6*   < > 36.2* 37.0* 37.0* 41.3 41.5  MCV 89.9  --  91.0 87.7 87.1 87.5 85.7  PLT 85*  --  106* 102* 117* 109* 117*   < > = values in this interval not displayed.   Basic Metabolic Panel: Recent Labs  Lab 06/13/20 0500 06/14/20 0447 06/15/20 0454 06/16/20 0351 06/17/20 0745  NA 137 138 139 137 135  K 3.8 3.2* 3.6 3.5 3.4*  CL 98 100 102 99 101  CO2 20* 21* 20* 19* 19*   GLUCOSE 158* 164* 148* 187* 186*  BUN 66* 58* 78* 95* 66*  CREATININE 6.91* 5.47* 6.42* 6.78* 4.97*  CALCIUM 7.6* 7.3* 6.6* 6.6* 6.8*  MG 2.2 2.2 2.1 2.2 2.0    Studies: No results found.  Scheduled Meds: . apixaban  5 mg Oral BID  . aspirin  81 mg Oral Daily  . atorvastatin  10 mg Oral Q M,W,F  . bisacodyl  10 mg Oral Daily  . Chlorhexidine Gluconate Cloth  6 each Topical Q0600  . docusate sodium  100 mg Oral BID  . hydrALAZINE  10 mg Oral TID  . insulin aspart  0-20 Units Subcutaneous TID WC  . insulin glargine  10 Units Subcutaneous Daily  . isosorbide mononitrate  15 mg Oral Daily  . lipase/protease/amylase  12,000 Units Oral TID AC  . magnesium chloride  2 tablet Oral BID  . metoprolol succinate  25 mg Oral Daily  . pantoprazole  80 mg Oral Daily  . polyethylene glycol  17 g Oral Daily  . predniSONE  50  mg Oral Daily  . sertraline  50 mg Oral QHS  . sodium chloride flush  3 mL Intravenous Q12H  . tacrolimus  2 mg Oral BID  . tamsulosin  0.4 mg Oral QPC supper   Continuous Infusions: . sodium chloride     PRN Meds: sodium chloride, acetaminophen, chlorpheniramine-HYDROcodone, guaiFENesin-dextromethorphan, Ipratropium-Albuterol, Resource ThickenUp Clear, sodium chloride flush, traMADol  Time spent: 35 minutes  Author: Berle Mull, MD [facility] 06/17/2020 6:02 PM  To reach On-call, see care teams to locate the attending and reach out via [email or web address]. Between 7PM-7AM, please contact night-coverage If you still have difficulty reaching the attending provider, please page the [facility] (Director on Call) for [facility] on amion for assistance.

## 2020-06-18 DIAGNOSIS — R7881 Bacteremia: Secondary | ICD-10-CM | POA: Diagnosis not present

## 2020-06-18 DIAGNOSIS — B952 Enterococcus as the cause of diseases classified elsewhere: Secondary | ICD-10-CM | POA: Diagnosis not present

## 2020-06-18 LAB — GLUCOSE, CAPILLARY
Glucose-Capillary: 278 mg/dL — ABNORMAL HIGH (ref 70–99)
Glucose-Capillary: 218 mg/dL — ABNORMAL HIGH (ref 70–99)
Glucose-Capillary: 160 mg/dL — ABNORMAL HIGH (ref 70–99)
Glucose-Capillary: 125 mg/dL — ABNORMAL HIGH (ref 70–99)

## 2020-06-18 LAB — CULTURE, BLOOD (ROUTINE X 2)
Culture: NO GROWTH
Culture: NO GROWTH
Special Requests: ADEQUATE
Special Requests: ADEQUATE

## 2020-06-18 MED ORDER — PREDNISONE 10 MG PO TABS
10.0000 mg | ORAL_TABLET | Freq: Every day | ORAL | Status: AC
  Administered 2020-06-24 – 2020-06-25 (×2): 10 mg via ORAL
  Filled 2020-06-18 (×3): qty 1

## 2020-06-18 MED ORDER — VANCOMYCIN HCL IN DEXTROSE 750-5 MG/150ML-% IV SOLN: 750.0000 mg | INTRAVENOUS | Status: DC

## 2020-06-18 MED ORDER — PREDNISONE 20 MG PO TABS
20.0000 mg | ORAL_TABLET | Freq: Every day | ORAL | Status: AC
  Administered 2020-06-22 – 2020-06-23 (×2): 20 mg via ORAL
  Filled 2020-06-18 (×2): qty 1

## 2020-06-18 MED ORDER — PREDNISONE 20 MG PO TABS
40.0000 mg | ORAL_TABLET | Freq: Every day | ORAL | Status: AC
  Administered 2020-06-18 – 2020-06-19 (×2): 40 mg via ORAL
  Filled 2020-06-18 (×2): qty 2

## 2020-06-18 MED ORDER — PREDNISONE 20 MG PO TABS
30.0000 mg | ORAL_TABLET | Freq: Every day | ORAL | Status: AC
  Administered 2020-06-20 – 2020-06-21 (×2): 30 mg via ORAL
  Filled 2020-06-18 (×2): qty 1

## 2020-06-18 NOTE — Progress Notes (Signed)
Progress Note  Patient Name: Paul [name] Date of Encounter: 06/18/2020  [facility] Cardiologist: Evalina Field, MD   Telehealth visit, review of data, spoke with patient via telephonic device secondary to the COVID-19 pandemic in order to reduce PPE burn/exposure.  Subjective   Feeling better.  Less short of breath.  No chest pain.  Inpatient Medications    Scheduled Meds: . apixaban  5 mg Oral BID  . aspirin  81 mg Oral Daily  . atorvastatin  10 mg Oral Q M,W,F  . bisacodyl  10 mg Oral Daily  . Chlorhexidine Gluconate Cloth  6 each Topical Q0600  . docusate sodium  100 mg Oral BID  . hydrALAZINE  10 mg Oral TID  . insulin aspart  0-20 Units Subcutaneous TID WC  . insulin glargine  10 Units Subcutaneous Daily  . isosorbide mononitrate  15 mg Oral Daily  . lipase/protease/amylase  12,000 Units Oral TID AC  . magnesium chloride  2 tablet Oral BID  . metoprolol succinate  25 mg Oral Daily  . pantoprazole  80 mg Oral Daily  . polyethylene glycol  17 g Oral Daily  . predniSONE  40 mg Oral Q breakfast   Followed by  . [START ON 06/20/2020] predniSONE  30 mg Oral Q breakfast   Followed by  . [START ON 06/22/2020] predniSONE  20 mg Oral Q breakfast   Followed by  . [START ON 06/24/2020] predniSONE  10 mg Oral Q breakfast  . sertraline  50 mg Oral QHS  . sodium chloride flush  3 mL Intravenous Q12H  . tacrolimus  2 mg Oral BID  . tamsulosin  0.4 mg Oral QPC supper   Continuous Infusions: . sodium chloride    . [START ON 06/20/2020] vancomycin     PRN Meds: sodium chloride, acetaminophen, chlorpheniramine-HYDROcodone, guaiFENesin-dextromethorphan, Ipratropium-Albuterol, Resource ThickenUp Clear, sodium chloride flush, traMADol   Vital Signs    Vitals:   06/17/20 1943 06/17/20 2232 06/18/20 0353 06/18/20 1121  BP: 139/66 138/66 (!) 152/76 134/72  Pulse: (!) 59 (!) 58 (!) 55 61  Resp:  20    Temp: 98.2 F (36.8 C) 97.9 F (36.6 C) 97.7 F (36.5 C)   TempSrc: Oral      SpO2: 97% 96% 95% 100%  Weight:      Height:        Intake/Output Summary (Last 24 hours) at 06/18/2020 1211 Last data filed at 06/17/2020 1530 Gross per 24 hour  Intake 600 ml  Output -  Net 600 ml   Last 3 Weights 06/16/2020 06/16/2020 06/11/2020  Weight (lbs) 136 lb 14.5 oz 139 lb 15.9 oz 160 lb 11.5 oz  Weight (kg) 62.1 kg 63.5 kg 72.9 kg      Telemetry    Sinus rhythm/sinus bradycardia- Personally Reviewed  ECG    Prior EKG showed atrial flutter with 2-1 AV block- Personally Reviewed  Physical Exam   Able to complete full sentences without difficulty.  Normal respiratory effort.  Labs    High Sensitivity Troponin:  No results for input(s): TROPONINIHS in the last 720 hours.    Chemistry Recent Labs  Lab 06/14/20 0447 06/15/20 0454 06/16/20 0351 06/17/20 0745  NA 138 139 137 135  K 3.2* 3.6 3.5 3.4*  CL 100 102 99 101  CO2 21* 20* 19* 19*  GLUCOSE 164* 148* 187* 186*  BUN 58* 78* 95* 66*  CREATININE 5.47* 6.42* 6.78* 4.97*  CALCIUM 7.3* 6.6* 6.6* 6.8*  PROT  5.0* 4.3* 4.3*  --   ALBUMIN 2.1* 1.8* 1.9*  --   AST 23 31 25   --   ALT 13 18 20   --   ALKPHOS 71 74 70  --   BILITOT 1.1 0.7 0.8  --   GFRNONAA 11* 9* 8* 12*  ANIONGAP 17* 17* 19* 15     Hematology Recent Labs  Lab 06/15/20 0454 06/16/20 0706 06/17/20 0745  WBC 8.2 7.7 10.6*  RBC 4.25 4.72 4.84  HGB 11.7* 12.6* 12.6*  HCT 37.0* 41.3 41.5  MCV 87.1 87.5 85.7  MCH 27.5 26.7 26.0  MCHC 31.6 30.5 30.4  RDW 17.4* 17.5* 18.1*  PLT 117* 109* 117*    BNP Recent Labs  Lab 06/11/20 1326  BNP >4,500.0*     DDimer  Recent Labs  Lab 06/15/20 0454 06/16/20 0351 06/17/20 0745  DDIMER 2.05* 2.95* 3.73*     Radiology    No results found.  Cardiac Studies    2D echo Echocardiogram 06/13/2020: Impression: 1. Limited study to assess LV function; not all views obtained.  2. Left ventricular ejection fraction, by estimation, is 25 to 30%. The  left ventricle has severely  decreased function. The left ventricle  demonstrates global hypokinesis. Left ventricular diastolic parameters are  consistent with Grade II diastolic  dysfunction (pseudonormalization).  3. Right ventricular systolic function is mildly reduced. The right  ventricular size is normal. There is normal pulmonary artery systolic  pressure.  4. Left atrial size was moderately dilated.  5. Right atrial size was mildly dilated.  6. Moderate pleural effusion in the left lateral region.  7. The mitral valve is normal in structure. Mild mitral valve  regurgitation. No evidence of mitral stenosis.  8. The aortic valve is tricuspid. Aortic valve regurgitation is mild.  Mild to moderate aortic valve sclerosis/calcification is present, without  any evidence of aortic stenosis.  9. The inferior vena cava is normal in size with greater than 50%  respiratory variability, suggesting right atrial pressure of 3 mmHg.  01/21/2019 nuclear stress test  Nuclear stress EF: 46%.  There was no ST segment deviation noted during stress.  The left ventricular ejection fraction is mildly decreased (45-54%).  Mild wall motion abnormalities of anterior and anterolateral wall on this imaging. Normal perfusion in this area. Would consider echo for formal evaluation of wall motion and EF.  No prior study for comparison.   Patient Profile     65 y.o. male with a historyof paroxysmal atrial fibrillation on Eliquis, PAD s/p bilateral BKA, mild aortic stenosis on Echo in 01/2019, CVA, hypertension, type 1 diabetes mellitus, ESRD with failed renal transplant on hemodialysis onT/Th/Sat, and duodenal cancer s/p resectionwho is being seen for the evaluation ofatrial fibrillation and newly depressed LVEF.  Assessment & Plan    Paroxysmal atrial flutter with rapid ventricular response -Atrial flutter noted on this admission with prior atrial fibrillation in August 2021. -Maintaining sinus rhythm currently on  beta-blocker and Eliquis.  CHA2DS2-VASc score of 6 given history of prior stroke.  Acute on chronic combined systolic and diastolic heart failure -Symptoms have improved since fluid removal with hemodialysis. -Ejection fraction 25 to 30%.  Previously normal in 2020.  Nuclear stress test in 2020 no ischemia. -Possible etiologies include cardiomyopathy from tachycardia.  He did report intermittent palpitations over the past several weeks.  COVID-19 also in the differential.  Could he also have ischemic cardiomyopathy (balanced ischemia?).  -On low-dose beta-blocker.  RASS inhibitors have been off secondary to  renal function. -One strategy may be to repeat echocardiogram after a few weeks of maintenance of sinus rhythm with good rate control to see if ejection fraction resolves.  Peripheral arterial disease -Status post bilateral BKA's -Certainly could increase the risk of coronary disease underlying.  End-stage renal disease -Failed renal transplant.  Tuesday Thursday Saturday hemodialysis.  Aortic stenosis -Mild on echocardiogram.  Covid pneumonia.  For questions or updates, please contact [place] Please consult [email or web address] for contact info under        Signed, Candee Furbish, MD  06/18/2020, 12:11 PM

## 2020-06-18 NOTE — Progress Notes (Signed)
[facility] Progress Note  Patient: Paul [name]    JOA:[id number]  DOA: 06/11/2020     Date of Service: the patient was seen and examined on 06/18/2020  Brief hospital course: 65 year old with history of paroxysmal A. fib, PAD, ESRD with failed renal transplant on HD TTS, DM2, diastolic CHF presented with complaints of shortness of breath after missing dialysis.  COVID-19 positive.  Chest x-ray showed multifocal opacities with left sided pleural effusion.  He was found to have COVID-19 pneumonia, atrial fibrillation with RVR and concerns of CHF exacerbation. Blood cultures growing Enterococcus started on vancomycin.  Infectious disease consulted.   Currently plan is continue antibiotics and arrange for safe discharge at SNF  Assessment and Plan: Acute respiratory distress Acute hypoxic respiratory failure, POA 88% on room air on admission Multiple etiology given underlying COVID-19 pneumonia, RVR, fluid overload from missing dialysis.   Continue supplemental oxygen as needed. Continue incentive spirometry. Wean oxygen.  Enterococcus bacteremia Sepsis POA Potential UA as a source. Met SIRS criteria on admission with tachycardia tachypnea as well as fever and hypoxia. IV vancomycin started ID following 2D echocardiogram no vegetation. EF 25-30%.  Surveillance cultures ordered 1/25, so far negative. ID recommends 2 weeks of IV antibiotics.  No TEE.  COVID-19 pneumonia Chest x-ray showing bilateral opacities.  Pulmonary edema Treated Solu-Medrol, Remdesivir, Procalcitonin-0.44 Now on prednisone taper. BNP- >4500 Incentive spirometer, flutter valve First positive test on 06/11/2020.  Out of isolation on 6/0/1093  Acute metabolic encephalopathy-resolved No acute findings but it shows interval lacunar infarct? On aspirin and statin LDL 64, A1c 7.5 UA-positive TSH, B12 and folate normal  ESRD on hemodialysis, failed renal transplant.  TTS Seen by nephrology.  HD plans for  nephrology  Acute systolic CHF on chronic diastolic CHF. Echo this admission EF 25%. Echo 01/2019-EF 65%.   Presented with volume overload. Cardiology consulted due to acute drop in the EF which is likely secondary to tachycardia as well as sepsis. Cardiology will initiate GDMT, Currently on aspirin, Lipitor, hydralazine, Imdur, Toprol. Mild bradycardia seen on the telemetry scan. Repeat follow-up echocardiogram in the clinic. Ischemic work-up outpatient if no improvement in EF. Appreciate assistance.  Paroxysmal atrial fibrillation with RVR Tachybradycardia syndrome On Norvasc and Lopressor. Ziio patch was ordered outpatient.  On home Eliquis. Currently on cardioprotective medication for low EF. Initially required Cardizem drip.  Diabetes mellitus type 2, mild hyperglycemia secondary to steroid use Increase Lantus 10 units daily.  Insulin sliding scale and Accu-Chek. Monitor for hypoglycemia now that the steroids are reducing.  Essential hypertension Medication have been adjusted.  Blood pressure stable.  GERD PPI  Depression Zoloft at bedtime  BPH Flomax  Dysphagia. Seen by speech therapy on 1/25. Modified barium swallow performed. Currently on dysphagia 2 diet nectar thick liquid. Anticipating that with improvement in patient's strength diet should be advanced.  Currently awaiting reevaluation by speech therapy.  Diet: Renal diet dysphagia 2 nectar thick DVT Prophylaxis:    apixaban (ELIQUIS) tablet 5 mg    Advance goals of care discussion: Full code  Family Communication: no family was present at bedside, at the time of interview.  Discussed with wife on the phone on 1/28  Disposition:  Status is: Inpatient  Remains inpatient appropriate because:IV treatments appropriate due to intensity of illness or inability to take PO  Dispo: The patient is from: Home              Anticipated d/c is to: Home  Anticipated d/c date is: 2 days               Patient currently is not medically stable to d/c.   Difficult to place patient No  Subjective: No nausea no vomiting but no fever no chills.  No chest pain.  No abdominal pain.  No diarrhea.  No acute complaint.  Looking forward to advancing his diet further.  Physical Exam:  General: Appear in mild distress, no Rash; Oral Mucosa Clear, moist. no Abnormal Neck Mass Or lumps, Conjunctiva normal  Cardiovascular: S1 and S2 Present, no Murmur, Respiratory: good respiratory effort, Bilateral Air entry present and CTA, no Crackles, no wheezes Abdomen: Bowel Sound present, Soft and no tenderness Extremities: Bilateral BKA Neurology: alert and oriented to time, place, and person affect appropriate. no new focal deficit Gait not checked due to patient safety concerns    Vitals:   06/17/20 2232 06/18/20 0353 06/18/20 1121 06/18/20 1745  BP: 138/66 (!) 152/76 134/72 (!) 147/72  Pulse: (!) 58 (!) 55 61 (!) 55  Resp: $Remo'20  18 15  'szvXx$ Temp: 97.9 F (36.6 C) 97.7 F (36.5 C) 98.2 F (36.8 C) 98.4 F (36.9 C)  TempSrc:      SpO2: 96% 95% 100% 95%  Weight:      Height:        Intake/Output Summary (Last 24 hours) at 06/18/2020 1925 Last data filed at 06/18/2020 0800 Gross per 24 hour  Intake 240 ml  Output -  Net 240 ml   Filed Weights   06/11/20 2300 06/16/20 0745 06/16/20 1038  Weight: 72.9 kg 63.5 kg 62.1 kg    Data Reviewed: I have personally reviewed and interpreted daily labs, tele strips, imaging. I reviewed all nursing notes, pharmacy notes, vitals, pertinent old records I have discussed plan of care as described above with RN and patient/family.  CBC: Recent Labs  Lab 06/12/20 0907 06/12/20 1026 06/13/20 0500 06/14/20 0447 06/15/20 0454 06/16/20 0706 06/17/20 0745  WBC 4.4  --  7.5 8.3 8.2 7.7 10.6*  NEUTROABS 3.5  --  6.1 7.3 6.8 6.5  --   HGB 11.2*  --  10.6* 11.6* 11.7* 12.6* 12.6*  HCT 36.6*   < > 36.2* 37.0* 37.0* 41.3 41.5  MCV 89.9  --  91.0 87.7 87.1 87.5 85.7   PLT 85*  --  106* 102* 117* 109* 117*   < > = values in this interval not displayed.   Basic Metabolic Panel: Recent Labs  Lab 06/13/20 0500 06/14/20 0447 06/15/20 0454 06/16/20 0351 06/17/20 0745  NA 137 138 139 137 135  K 3.8 3.2* 3.6 3.5 3.4*  CL 98 100 102 99 101  CO2 20* 21* 20* 19* 19*  GLUCOSE 158* 164* 148* 187* 186*  BUN 66* 58* 78* 95* 66*  CREATININE 6.91* 5.47* 6.42* 6.78* 4.97*  CALCIUM 7.6* 7.3* 6.6* 6.6* 6.8*  MG 2.2 2.2 2.1 2.2 2.0    Studies: No results found.  Scheduled Meds: . apixaban  5 mg Oral BID  . aspirin  81 mg Oral Daily  . atorvastatin  10 mg Oral Q M,W,F  . bisacodyl  10 mg Oral Daily  . Chlorhexidine Gluconate Cloth  6 each Topical Q0600  . docusate sodium  100 mg Oral BID  . hydrALAZINE  10 mg Oral TID  . insulin aspart  0-20 Units Subcutaneous TID WC  . insulin glargine  10 Units Subcutaneous Daily  . isosorbide mononitrate  15 mg Oral Daily  .  lipase/protease/amylase  12,000 Units Oral TID AC  . magnesium chloride  2 tablet Oral BID  . metoprolol succinate  25 mg Oral Daily  . pantoprazole  80 mg Oral Daily  . polyethylene glycol  17 g Oral Daily  . predniSONE  40 mg Oral Q breakfast   Followed by  . [START ON 06/20/2020] predniSONE  30 mg Oral Q breakfast   Followed by  . [START ON 06/22/2020] predniSONE  20 mg Oral Q breakfast   Followed by  . [START ON 06/24/2020] predniSONE  10 mg Oral Q breakfast  . sertraline  50 mg Oral QHS  . sodium chloride flush  3 mL Intravenous Q12H  . tacrolimus  2 mg Oral BID  . tamsulosin  0.4 mg Oral QPC supper   Continuous Infusions: . sodium chloride    . [START ON 06/20/2020] vancomycin     PRN Meds: sodium chloride, acetaminophen, chlorpheniramine-HYDROcodone, guaiFENesin-dextromethorphan, Ipratropium-Albuterol, Resource ThickenUp Clear, sodium chloride flush, traMADol  Time spent: 35 minutes  Author: Berle Mull, MD [facility] 06/18/2020 7:25 PM  To reach On-call, see care teams to  locate the attending and reach out via [email or web address]. Between 7PM-7AM, please contact night-coverage If you still have difficulty reaching the attending provider, please page the [facility] (Director on Call) for [facility] on amion for assistance.

## 2020-06-18 NOTE — Progress Notes (Addendum)
Pharmacy Antibiotic Note  Paul [name] is a 65 y.o. male admitted on 06/11/2020 with bacteremia.   Continues on Vancomycin   ESRD - TThSat usually  Repeat blood cultures negative  Antibiotic stop date 06/26/20  Plan: Continue Vancomycin 750 mg iv after each HD session Follow up HD schedule  Height: 6' (182.9 cm) Weight: 62.1 kg (136 lb 14.5 oz) IBW/kg (Calculated) : 77.6  Temp (24hrs), Avg:97.9 F (36.6 C), Min:97.6 F (36.4 C), Max:98.2 F (36.8 C)  Recent Labs  Lab 06/11/20 1326 06/12/20 0907 06/13/20 0500 06/14/20 0447 06/15/20 0454 06/16/20 0351 06/16/20 0706 06/17/20 0745  WBC 7.6   < > 7.5 8.3 8.2  --  7.7 10.6*  CREATININE 5.78*   < > 6.91* 5.47* 6.42* 6.78*  --  4.97*  LATICACIDVEN 2.2*  --   --   --   --   --   --   --    < > = values in this interval not displayed.    Estimated Creatinine Clearance: 12.8 mL/min (A) (by C-G formula based on SCr of 4.97 mg/dL (H)).    Allergies  Allergen Reactions  . Metformin And Related Other (See Comments)    Has kidney problems   Thank you Anette Guarneri, PharmD  06/18/2020 8:19 AM

## 2020-06-18 NOTE — Progress Notes (Signed)
[facility] Progress Note   Subjective:  Patient not examined today directly given COVID-19 + status, utilizing data taken from chart +/- discussions w/ providers and staff.       Objective Vitals:   06/17/20 2232 06/18/20 0353 06/18/20 1121 06/18/20 1745  BP: 138/66 (!) 152/76 134/72 (!) 147/72  Pulse: (!) 58 (!) 55 61 (!) 55  Resp: 20  18 15   Temp: 97.9 F (36.6 C) 97.7 F (36.5 C) 98.2 F (36.8 C) 98.4 F (36.9 C)  TempSrc:      SpO2: 96% 95% 100% 95%  Weight:      Height:       Physical Exam  Patient not examined today directly given COVID-19 + status, utilizing data taken from chart +/- discussions w/ providers and staff.   Additional Objective Labs: Basic Metabolic Panel: Recent Labs  Lab 06/15/20 0454 06/16/20 0351 06/17/20 0745  NA 139 137 135  K 3.6 3.5 3.4*  CL 102 99 101  CO2 20* 19* 19*  GLUCOSE 148* 187* 186*  BUN 78* 95* 66*  CREATININE 6.42* 6.78* 4.97*  CALCIUM 6.6* 6.6* 6.8*   Liver Function Tests: Recent Labs  Lab 06/14/20 0447 06/15/20 0454 06/16/20 0351  AST 23 31 25   ALT 13 18 20   ALKPHOS 71 74 70  BILITOT 1.1 0.7 0.8  PROT 5.0* 4.3* 4.3*  ALBUMIN 2.1* 1.8* 1.9*   No results for input(s): LIPASE, AMYLASE in the last 168 hours. CBC: Recent Labs  Lab 06/13/20 0500 06/14/20 0447 06/15/20 0454 06/16/20 0706 06/17/20 0745  WBC 7.5 8.3 8.2 7.7 10.6*  NEUTROABS 6.1 7.3 6.8 6.5  --   HGB 10.6* 11.6* 11.7* 12.6* 12.6*  HCT 36.2* 37.0* 37.0* 41.3 41.5  MCV 91.0 87.7 87.1 87.5 85.7  PLT 106* 102* 117* 109* 117*   Blood Culture    Component Value Date/Time   SDES BLOOD LEFT HAND 06/13/2020 0803   SDES BLOOD LEFT HAND 06/13/2020 0803   SPECREQUEST  06/13/2020 0803    BOTTLES DRAWN AEROBIC AND ANAEROBIC Blood Culture adequate volume   SPECREQUEST  06/13/2020 0803    BOTTLES DRAWN AEROBIC AND ANAEROBIC Blood Culture adequate volume   CULT  06/13/2020 0803    NO GROWTH 5 DAYS Performed at [facility],  [place] [address] [place], [place] [address]    CULT  06/13/2020 0803    NO GROWTH 5 DAYS Performed at [facility] [address] [place], [place] [address]    REPTSTATUS 06/18/2020 FINAL 06/13/2020 0803   REPTSTATUS 06/18/2020 FINAL 06/13/2020 0803    Cardiac Enzymes: No results for input(s): CKTOTAL, CKMB, CKMBINDEX, TROPONINI in the last 168 hours. CBG: Recent Labs  Lab 06/17/20 1719 06/17/20 2225 06/18/20 0657 06/18/20 1119 06/18/20 1650  GLUCAP 220* 248* 278* 218* 160*   Iron Studies:  No results for input(s): IRON, TIBC, TRANSFERRIN, FERRITIN in the last 72 hours. @lablastinr3 @ Studies/Results: No results found. Medications: . sodium chloride    . [START ON 06/20/2020] vancomycin     . apixaban  5 mg Oral BID  . aspirin  81 mg Oral Daily  . atorvastatin  10 mg Oral Q M,W,F  . bisacodyl  10 mg Oral Daily  . Chlorhexidine Gluconate Cloth  6 each Topical Q0600  . docusate sodium  100 mg Oral BID  . hydrALAZINE  10 mg Oral TID  . insulin aspart  0-20 Units Subcutaneous TID WC  . insulin glargine  10 Units Subcutaneous Daily  .  isosorbide mononitrate  15 mg Oral Daily  . lipase/protease/amylase  12,000 Units Oral TID AC  . magnesium chloride  2 tablet Oral BID  . metoprolol succinate  25 mg Oral Daily  . pantoprazole  80 mg Oral Daily  . polyethylene glycol  17 g Oral Daily  . predniSONE  40 mg Oral Q breakfast   Followed by  . [START ON 06/20/2020] predniSONE  30 mg Oral Q breakfast   Followed by  . [START ON 06/22/2020] predniSONE  20 mg Oral Q breakfast   Followed by  . [START ON 06/24/2020] predniSONE  10 mg Oral Q breakfast  . sertraline  50 mg Oral QHS  . sodium chloride flush  3 mL Intravenous Q12H  . tacrolimus  2 mg Oral BID  . tamsulosin  0.4 mg Oral QPC supper     OP HD: TTS NW  3.5h   66kh  3K/2.25 bath  RFA AVF  Hep none  - hect 2 ug  - mircera 200 q2 , last 1/13   CXR 1/23 - IMPRESSION: Diffuse interstitial prominence with streaky  interstitial opacities within the mid to lower lung fields and small left pleural effusion. Findings may reflect multifocal atypical/viral infection versus pulmonary edema.  Assessment/ Plan: 1. COVID-19 pneumonia: With O2 requirement. Per admitting team. Now off isolation.  2. Tachycardia/A-fib RVR: off cardizem, converted to sinus bradycardia. Cards following.   3.  A/C combined CHF: low dose ARB per cardiology. LVEF 25-30% here, G2DD. On hydralazine and imdur also. No BB d/t bradycardia.  4.  Hypoxemia: on 2 L ,admission + IS opacities by CXR. Per pmd.  5. ESRD:Usual TTS schedule - missed OP HD Sat 1/22. Had HD here on 1/24 and 1/28. Labs in reasonable range, no uremic signs. HD postponed from Sat to Sun and now to Monday. Will prioritize for HD tomorrow.  6. Hypertension/volume:BP controlled post HD. Euvolemic now. Very much under EDW. Will need lower EDW at Dc.  7. Anemia:Hgb > 12, no ESA for now. 8. Metabolic bone disease:CorrCa ok, Phos pending. Resume home binders (Renvela). 9. T1DM: Insulin per primary   Kelly Splinter  MD 06/18/2020, 8:42 PM

## 2020-06-18 NOTE — Progress Notes (Signed)
Spoke with HD nurse ,she said 'patient might not able too have dialysis tonight becouse they have only one nurse tonight and three emergency HD patient ahead for this patient.

## 2020-06-19 DIAGNOSIS — R7881 Bacteremia: Secondary | ICD-10-CM | POA: Diagnosis not present

## 2020-06-19 DIAGNOSIS — B952 Enterococcus as the cause of diseases classified elsewhere: Secondary | ICD-10-CM

## 2020-06-19 LAB — RENAL FUNCTION PANEL
Albumin: 2 g/dL — ABNORMAL LOW (ref 3.5–5.0)
Anion gap: 17 — ABNORMAL HIGH (ref 5–15)
BUN: 104 mg/dL — ABNORMAL HIGH (ref 8–23)
CO2: 18 mmol/L — ABNORMAL LOW (ref 22–32)
Calcium: 7.3 mg/dL — ABNORMAL LOW (ref 8.9–10.3)
Chloride: 102 mmol/L (ref 98–111)
Creatinine, Ser: 6.36 mg/dL — ABNORMAL HIGH (ref 0.61–1.24)
GFR, Estimated: 9 mL/min — ABNORMAL LOW (ref >60)
Glucose, Bld: 223 mg/dL — ABNORMAL HIGH (ref 70–99)
Phosphorus: 7.7 mg/dL — ABNORMAL HIGH (ref 2.5–4.6)
Potassium: 3.6 mmol/L (ref 3.5–5.1)
Sodium: 137 mmol/L (ref 135–145)

## 2020-06-19 LAB — CBC
HCT: 44.8 % (ref 39.0–52.0)
Hemoglobin: 13.6 g/dL (ref 13.0–17.0)
MCH: 26.2 pg (ref 26.0–34.0)
MCHC: 30.4 g/dL (ref 30.0–36.0)
MCV: 86.2 fL (ref 80.0–100.0)
Platelets: 169 10*3/uL (ref 150–400)
RBC: 5.2 MIL/uL (ref 4.22–5.81)
RDW: 19.5 % — ABNORMAL HIGH (ref 11.5–15.5)
WBC: 13.6 10*3/uL — ABNORMAL HIGH (ref 4.0–10.5)
nRBC: 0 % (ref 0.0–0.2)

## 2020-06-19 LAB — GLUCOSE, CAPILLARY
Glucose-Capillary: 206 mg/dL — ABNORMAL HIGH (ref 70–99)
Glucose-Capillary: 225 mg/dL — ABNORMAL HIGH (ref 70–99)
Glucose-Capillary: 211 mg/dL — ABNORMAL HIGH (ref 70–99)
Glucose-Capillary: 176 mg/dL — ABNORMAL HIGH (ref 70–99)

## 2020-06-19 MED ORDER — CHLORHEXIDINE GLUCONATE CLOTH 2 % EX PADS: 6.0000 | MEDICATED_PAD | Freq: Every day | CUTANEOUS | Status: DC

## 2020-06-19 MED ORDER — VANCOMYCIN HCL IN DEXTROSE 750-5 MG/150ML-% IV SOLN
750.0000 mg | Freq: Once | INTRAVENOUS | Status: AC
  Administered 2020-06-19: 750 mg via INTRAVENOUS
  Filled 2020-06-19: qty 150

## 2020-06-19 NOTE — Progress Notes (Signed)
Physical Therapy Treatment Patient Details Name: ABRHAM MASLOWSKI MRN: [id number] DOB: Jul 24, 1955 Today's Date: 06/19/2020    History of Present Illness This 65yo male was referred to PT on 04/27/2019 by [name], DO with weakness & gait disorder.  He had PT following bil. TTAs & TIA and successfully became ambulatory at community level. His kidney transplant is failing and is back on transplant list. He was hospitalized 4 times in 2020 with medical issues. His current prostheses delivered late 2020.   HTN, DM2, renal transplant, CKD st IV, calciphylaxis, A-Fib, bil. TTAs, TIA    PT Comments    Patient received in bed, fatigued but agreeable to PT today. Able to roll side to side with S and rail and increased time, totalA for pericare and still with great difficulty in getting up to sitting at EOB requiring +2 assistance. Continues to require heavy +2 assist and elevated bed to get up to standing with RW with multiple attempts, but once on his feet was able to progress gait distance in room. Needed increased time for processing and cues for sequencing today, seemed to have a hard time with problem solving. Left up in recliner with all needs met, chair alarm active and RN aware of patient status.    Follow Up Recommendations  SNF;Supervision/Assistance - 24 hour     Equipment Recommendations  Wheelchair cushion (measurements PT);Wheelchair (measurements PT);3in1 (PT);Rolling walker with 5" wheels    Recommendations for Other Services       Precautions / Restrictions Precautions Precautions: Fall;Other (comment) Precaution Comments: B BKA with prosthetics in room now Restrictions Weight Bearing Restrictions: No    Mobility  Bed Mobility Overal bed mobility: Needs Assistance Bed Mobility: Rolling;Supine to Sit Rolling: Supervision   Supine to sit: Max assist;+2 for physical assistance     General bed mobility comments: able to roll with supervision with rail, but needed MaxAx2 for  getting up to midilne sitting from flat bed today due to generalized weakness  Transfers Overall transfer level: Needs assistance Equipment used: Rolling walker (2 wheeled) Transfers: Sit to/from Stand Sit to Stand: Mod assist;+2 physical assistance;From elevated surface         General transfer comment: able to stand up just enough to barely clear hips from bed on standard height surface and +2 assist, needed bed elevated and still +2 assist to power all the way up to standing with RW  Ambulation/Gait Ambulation/Gait assistance: +2 physical assistance;Min guard Gait Distance (Feet): 8 Feet Assistive device: Rolling walker (2 wheeled) Gait Pattern/deviations: Step-to pattern;Decreased step length - right;Decreased step length - left;Trunk flexed;Decreased weight shift to right;Decreased weight shift to left Gait velocity: decreased   General Gait Details: slow but steady wtih the RW once up on his feet but does fatigue easily; step to pattern with B prosthetics   Stairs             Wheelchair Mobility    Modified Rankin (Stroke Patients Only)       Balance Overall balance assessment: Needs assistance Sitting-balance support: No upper extremity supported;Feet unsupported Sitting balance-Leahy Scale: Poor Sitting balance - Comments: MinA for balance when donning prosthetics Postural control: Posterior lean Standing balance support: Bilateral upper extremity supported;During functional activity Standing balance-Leahy Scale: Poor Standing balance comment: reliant on external support and BUE support                            Cognition Arousal/Alertness: Awake/alert Behavior During Therapy:  WFL for tasks assessed/performed;Flat affect Overall Cognitive Status: Within Functional Limits for tasks assessed                                 General Comments: functional but with increased difficulty with sequencing tasks today, also with increased  processing time as compared to prior sessions      Exercises      General Comments General comments (skin integrity, edema, etc.): on RA today, no physical or cognitive signs of respiratory distress or desat      Pertinent Vitals/Pain Pain Assessment: Faces Faces Pain Scale: Hurts a little bit Pain Location: generalized Pain Descriptors / Indicators: Discomfort Pain Intervention(s): Monitored during session;Limited activity within patient's tolerance    Home Living                      Prior Function            PT Goals (current goals can now be found in the care plan section) Acute Rehab PT Goals Patient Stated Goal: be able to go home from hospital PT Goal Formulation: With patient Time For Goal Achievement: 06/27/20 Potential to Achieve Goals: Fair Progress towards PT goals: Progressing toward goals    Frequency    Min 2X/week      PT Plan Frequency needs to be updated    Co-evaluation              AM-PAC PT "6 Clicks" Mobility   Outcome Measure  Help needed turning from your back to your side while in a flat bed without using bedrails?: A Little Help needed moving from lying on your back to sitting on the side of a flat bed without using bedrails?: A Lot Help needed moving to and from a bed to a chair (including a wheelchair)?: A Little Help needed standing up from a chair using your arms (e.g., wheelchair or bedside chair)?: A Lot Help needed to walk in hospital room?: A Little Help needed climbing 3-5 steps with a railing? : Total 6 Click Score: 14    End of Session Equipment Utilized During Treatment: Gait belt Activity Tolerance: Patient limited by fatigue Patient left: in chair;with call bell/phone within reach;with chair alarm set Nurse Communication: Mobility status PT Visit Diagnosis: Other abnormalities of gait and mobility (R26.89);Muscle weakness (generalized) (M62.81)     Time: 7858-8502 PT Time Calculation (min) (ACUTE  ONLY): 29 min  Charges:  $Gait Training: 8-22 mins (co-tx with OT)                     [name], DPT, PN1   Supplemental Physical Therapist [place]    Pager [phone] Acute Rehab Office [phone]

## 2020-06-19 NOTE — Progress Notes (Addendum)
[facility] Progress Note  Patient: Paul [name]    GUR:[id number]  DOA: 06/11/2020     Date of Service: the patient was seen and examined on 06/19/2020  Brief hospital course: 65 year old with history of paroxysmal A. fib, PAD, ESRD with failed renal transplant on HD TTS, DM2, diastolic CHF presented with complaints of shortness of breath after missing dialysis.  COVID-19 positive.  Chest x-ray showed multifocal opacities with left sided pleural effusion.  He was found to have COVID-19 pneumonia, atrial fibrillation with RVR and concerns of CHF exacerbation. Blood cultures growing Enterococcus started on vancomycin.  Infectious disease consulted.   Currently plan is continue antibiotics and arrange for safe discharge at SNF  Assessment and Plan: Acute respiratory distress Acute hypoxic respiratory failure, POA 88% on room air on admission Multiple etiology given underlying COVID-19 pneumonia, RVR, fluid overload from missing dialysis.   Continue supplemental oxygen as needed. Continue incentive spirometry. Wean oxygen.  Enterococcus bacteremia Sepsis POA Potential UA as a source. Met SIRS criteria on admission with tachycardia tachypnea as well as fever and hypoxia. IV vancomycin started ID following 2D echocardiogram no vegetation. EF 25-30%.  Surveillance cultures ordered 1/25, so far negative. ID recommends 2 weeks of IV antibiotics.  No TEE.  COVID-19 pneumonia Chest x-ray showing bilateral opacities.  Pulmonary edema Treated Solu-Medrol, Remdesivir, Procalcitonin-0.44 Now on prednisone taper. BNP- >4500 Incentive spirometer, flutter valve First positive test on 06/11/2020.  Out of isolation on 06/27/3149  Acute metabolic encephalopathy-resolved No acute findings but it shows interval lacunar infarct? On aspirin and statin LDL 64, A1c 7.5 UA-positive TSH, B12 and folate normal  ESRD on hemodialysis, failed renal transplant.  TTS Seen by nephrology.  HD plans for  nephrology  Acute systolic CHF on chronic diastolic CHF. Echo this admission EF 25%. Echo 01/2019-EF 65%.   Presented with volume overload. Cardiology consulted due to acute drop in the EF which is likely secondary to tachycardia as well as sepsis. Cardiology will initiate GDMT, Currently on aspirin, Lipitor, hydralazine, Imdur, Toprol. Mild bradycardia seen on the telemetry scan. Repeat follow-up echocardiogram in the clinic. Ischemic work-up outpatient if no improvement in EF. Appreciate assistance.  Paroxysmal atrial fibrillation with RVR Tachybradycardia syndrome On Norvasc and Lopressor. Ziio patch was ordered outpatient.  On home Eliquis. Currently on cardioprotective medication for low EF. Initially required Cardizem drip.  Diabetes mellitus type 2, mild hyperglycemia secondary to steroid use Increase Lantus 10 units daily.  Insulin sliding scale and Accu-Chek. Monitor for hypoglycemia now that the steroids are reducing.  Essential hypertension Medication have been adjusted.  Blood pressure stable.  GERD PPI  Depression Zoloft at bedtime  BPH Flomax  Dysphagia. Seen by speech therapy Modified barium swallow performed. Was on dysphagia 2 diet nectar thick liquid. On reevaluation currently on regular diet.  Diet: Renal diet DVT Prophylaxis:    apixaban (ELIQUIS) tablet 5 mg    Advance goals of care discussion: Full code  Family Communication: no family was present at bedside, at the time of interview.  Discussed with wife on the phone on 1/28  Disposition:  Status is: Inpatient  Remains inpatient appropriate because:IV treatments appropriate due to intensity of illness or inability to take PO  Dispo: The patient is from: Home              Anticipated d/c is to: SNF              Anticipated d/c date is: 1 day  Patient currently medically stable   Difficult to place patient No  Subjective: No nausea no vomiting.  No fever no chills.  No  acute complaints.  Physical Exam:  General: Appear in mild distress, no Rash; Oral Mucosa Clear, moist. no Abnormal Neck Mass Or lumps, Conjunctiva normal  Cardiovascular: S1 and S2 Present, no Murmur, Respiratory: good respiratory effort, Bilateral Air entry present and CTA, no Crackles, no wheezes Abdomen: Bowel Sound present, Soft and no tenderness Extremities: Bilateral BKA Neurology: alert and oriented to time, place, and person affect appropriate. no new focal deficit Gait not checked due to patient safety concerns    Vitals:   06/19/20 1630 06/19/20 1700 06/19/20 1758 06/19/20 2022  BP: 123/65 128/65 (!) 142/70 128/66  Pulse: 60 (!) 59 (!) 58 (!) 58  Resp:  $Remo'17 20 17  'fJSld$ Temp:  97.9 F (36.6 C) 97.9 F (36.6 C) 97.8 F (36.6 C)  TempSrc:  Oral    SpO2:  96% 95% 96%  Weight:  64.9 kg    Height:        Intake/Output Summary (Last 24 hours) at 06/19/2020 2117 Last data filed at 06/19/2020 1700 Gross per 24 hour  Intake 600 ml  Output 1000 ml  Net -400 ml   Filed Weights   06/16/20 1038 06/19/20 1425 06/19/20 1700  Weight: 62.1 kg 66.8 kg 64.9 kg    Data Reviewed: I have personally reviewed and interpreted daily labs, tele strips, imaging. I reviewed all nursing notes, pharmacy notes, vitals, pertinent old records I have discussed plan of care as described above with RN and patient/family.  CBC: Recent Labs  Lab 06/13/20 0500 06/14/20 0447 06/15/20 0454 06/16/20 0706 06/17/20 0745 06/19/20 0805  WBC 7.5 8.3 8.2 7.7 10.6* 13.6*  NEUTROABS 6.1 7.3 6.8 6.5  --   --   HGB 10.6* 11.6* 11.7* 12.6* 12.6* 13.6  HCT 36.2* 37.0* 37.0* 41.3 41.5 44.8  MCV 91.0 87.7 87.1 87.5 85.7 86.2  PLT 106* 102* 117* 109* 117* 622   Basic Metabolic Panel: Recent Labs  Lab 06/13/20 0500 06/14/20 0447 06/15/20 0454 06/16/20 0351 06/17/20 0745 06/19/20 0811  NA 137 138 139 137 135 137  K 3.8 3.2* 3.6 3.5 3.4* 3.6  CL 98 100 102 99 101 102  CO2 20* 21* 20* 19* 19* 18*   GLUCOSE 158* 164* 148* 187* 186* 223*  BUN 66* 58* 78* 95* 66* 104*  CREATININE 6.91* 5.47* 6.42* 6.78* 4.97* 6.36*  CALCIUM 7.6* 7.3* 6.6* 6.6* 6.8* 7.3*  MG 2.2 2.2 2.1 2.2 2.0  --   PHOS  --   --   --   --   --  7.7*    Studies: No results found.  Scheduled Meds: . apixaban  5 mg Oral BID  . aspirin  81 mg Oral Daily  . atorvastatin  10 mg Oral Q M,W,F  . bisacodyl  10 mg Oral Daily  . Chlorhexidine Gluconate Cloth  6 each Topical Q0600  . docusate sodium  100 mg Oral BID  . hydrALAZINE  10 mg Oral TID  . insulin aspart  0-20 Units Subcutaneous TID WC  . insulin glargine  10 Units Subcutaneous Daily  . isosorbide mononitrate  15 mg Oral Daily  . lipase/protease/amylase  12,000 Units Oral TID AC  . magnesium chloride  2 tablet Oral BID  . metoprolol succinate  25 mg Oral Daily  . pantoprazole  80 mg Oral Daily  . polyethylene glycol  17 g Oral Daily  . [  START ON 06/20/2020] predniSONE  30 mg Oral Q breakfast   Followed by  . [START ON 06/22/2020] predniSONE  20 mg Oral Q breakfast   Followed by  . [START ON 06/24/2020] predniSONE  10 mg Oral Q breakfast  . sertraline  50 mg Oral QHS  . sodium chloride flush  3 mL Intravenous Q12H  . tacrolimus  2 mg Oral BID  . tamsulosin  0.4 mg Oral QPC supper   Continuous Infusions: . sodium chloride     PRN Meds: sodium chloride, acetaminophen, chlorpheniramine-HYDROcodone, guaiFENesin-dextromethorphan, Ipratropium-Albuterol, Resource ThickenUp Clear, sodium chloride flush, traMADol  Time spent: 35 minutes  Author: Berle Mull, MD [facility] 06/19/2020 9:16 PM  To reach On-call, see care teams to locate the attending and reach out via [email or web address]. Between 7PM-7AM, please contact night-coverage If you still have difficulty reaching the attending provider, please page the [facility] (Director on Call) for [facility] on amion for assistance.

## 2020-06-19 NOTE — Progress Notes (Signed)
  Speech Language Pathology Treatment: Dysphagia  Patient Details Name: Paul [name] MRN: [id number] DOB: 1955-06-20 Today's Date: 06/19/2020 Time: 1320-1330 SLP Time Calculation (min) (ACUTE ONLY): 10 min  Assessment / Plan / Recommendation Clinical Impression  Pt reports dislike of meal texture. He has been snacking or graham crackers without difficulty. Subjectively laryngeal excursion still weak, but pt has not struggled with ongoing coughing. Under observation pt had no coughing with thin liquids as seen in prior sessions. In MBS aspiration was sensed. Given pts preferences and general improvement will upgrade diet and f/u once for tolerance.   HPI HPI: Paul [name] is a 65 y.o. male with medical history significant of of pAF (recurrence 12/2019 in setting of septic shock), PAD, ESRD with failed renal transplant on HD TuThSat,  DM, diastolicCVA who presents to the ED via EMS with a chief complaint of missed dialysis, COVID-positive. Patient reports being diagnosed with Covid 19 x a few days ago. Symptoms have been kept under control at home until he felt weaker today. Reports overall malaise along with shortness of breath. He is on dialysis THS but missed yesterday due to "bad weather".      SLP Plan  Continue with current plan of care       Recommendations  Diet recommendations: Regular;Thin liquid Liquids provided via: Cup;Straw Medication Administration: Whole meds with liquid Supervision: Patient able to self feed Compensations: Slow rate;Small sips/bites Postural Changes and/or Swallow Maneuvers: Seated upright 90 degrees                Plan: Continue with current plan of care       GO               Herbie [place], MA [place] Pager [phone] Office [phone]  Lynann Beaver 06/19/2020, 1:55 PM

## 2020-06-19 NOTE — Progress Notes (Addendum)
[facility] Progress Note   Subjective:   Pt seen in room. Denies SOB, CP, palpitations, abdominal pain and nausea. Still has intermittent non-productive cough.  Objective Vitals:   06/18/20 1745 06/18/20 2048 06/19/20 0536 06/19/20 1024  BP: (!) 147/72 120/66 (!) 142/98 140/63  Pulse: (!) 55 (!) 56 (!) 49 (!) 52  Resp: 15 18 20 16   Temp: 98.4 F (36.9 C) 98 F (36.7 C) 97.9 F (36.6 C) 97.7 F (36.5 C)  TempSrc:  Oral Oral Oral  SpO2: 95% 95% 97% 96%  Weight:      Height:       Physical Exam General: Ill appearing male, alert and in NAD Heart: Slightly bradycardic, regular rhythm, no murmurs, rubs or gallops Lungs: Respirations unlabored. Occasional crackles LLE. No wheezing. Abdomen: Soft, non-tender, non-distended, +BS Extremities: B/L BKA, no edema in hips Dialysis Access: RUE AVF  + bruit   Patient examined during COVID 19 pandemic wearing appropriate PPE including N95, face shield, gown, mask and gloves.    Additional Objective Labs: Basic Metabolic Panel: Recent Labs  Lab 06/16/20 0351 06/17/20 0745 06/19/20 0811  NA 137 135 137  K 3.5 3.4* 3.6  CL 99 101 102  CO2 19* 19* 18*  GLUCOSE 187* 186* 223*  BUN 95* 66* 104*  CREATININE 6.78* 4.97* 6.36*  CALCIUM 6.6* 6.8* 7.3*  PHOS  --   --  7.7*   Liver Function Tests: Recent Labs  Lab 06/14/20 0447 06/15/20 0454 06/16/20 0351 06/19/20 0811  AST 23 31 25   --   ALT 13 18 20   --   ALKPHOS 71 74 70  --   BILITOT 1.1 0.7 0.8  --   PROT 5.0* 4.3* 4.3*  --   ALBUMIN 2.1* 1.8* 1.9* 2.0*   No results for input(s): LIPASE, AMYLASE in the last 168 hours. CBC: Recent Labs  Lab 06/14/20 0447 06/15/20 0454 06/16/20 0706 06/17/20 0745 06/19/20 0805  WBC 8.3 8.2 7.7 10.6* 13.6*  NEUTROABS 7.3 6.8 6.5  --   --   HGB 11.6* 11.7* 12.6* 12.6* 13.6  HCT 37.0* 37.0* 41.3 41.5 44.8  MCV 87.7 87.1 87.5 85.7 86.2  PLT 102* 117* 109* 117* 169   Blood Culture    Component Value Date/Time    SDES BLOOD LEFT HAND 06/13/2020 0803   SDES BLOOD LEFT HAND 06/13/2020 0803   SPECREQUEST  06/13/2020 0803    BOTTLES DRAWN AEROBIC AND ANAEROBIC Blood Culture adequate volume   SPECREQUEST  06/13/2020 0803    BOTTLES DRAWN AEROBIC AND ANAEROBIC Blood Culture adequate volume   CULT  06/13/2020 0803    NO GROWTH 5 DAYS Performed at [facility], [place] [address], [place], [place] [address]    CULT  06/13/2020 0803    NO GROWTH 5 DAYS Performed at [place] [address] [place], [place] [address]    REPTSTATUS 06/18/2020 FINAL 06/13/2020 0803   REPTSTATUS 06/18/2020 FINAL 06/13/2020 0803   CBG: Recent Labs  Lab 06/18/20 0657 06/18/20 1119 06/18/20 1650 06/18/20 2107 06/19/20 0654  GLUCAP 278* 218* 160* 125* 206*   Medications: . sodium chloride    . vancomycin     . apixaban  5 mg Oral BID  . aspirin  81 mg Oral Daily  . atorvastatin  10 mg Oral Q M,W,F  . bisacodyl  10 mg Oral Daily  . Chlorhexidine Gluconate Cloth  6 each Topical Q0600  . docusate sodium  100 mg Oral BID  .  hydrALAZINE  10 mg Oral TID  . insulin aspart  0-20 Units Subcutaneous TID WC  . insulin glargine  10 Units Subcutaneous Daily  . isosorbide mononitrate  15 mg Oral Daily  . lipase/protease/amylase  12,000 Units Oral TID AC  . magnesium chloride  2 tablet Oral BID  . metoprolol succinate  25 mg Oral Daily  . pantoprazole  80 mg Oral Daily  . polyethylene glycol  17 g Oral Daily  . [START ON 06/20/2020] predniSONE  30 mg Oral Q breakfast   Followed by  . [START ON 06/22/2020] predniSONE  20 mg Oral Q breakfast   Followed by  . [START ON 06/24/2020] predniSONE  10 mg Oral Q breakfast  . sertraline  50 mg Oral QHS  . sodium chloride flush  3 mL Intravenous Q12H  . tacrolimus  2 mg Oral BID  . tamsulosin  0.4 mg Oral QPC supper    Dialysis Orders: TTS NW 3.5h 66kh 3K/2.25 bath RFA AVF Hep none - hect 2 ug - mircera 200 q2 , last 1/13  CXR 1/23 - IMPRESSION: Diffuse  interstitial prominence with streaky interstitial opacities within the mid to lower lung fields and small left pleural effusion. Findings may reflect multifocal atypical/viral infection versus pulmonary edema.  Assessment/Plan: 1. COVID-19 pneumonia: With O2 requirement. Per admitting team. First positive test was 06/12/19. Remains on isolation until 06/22/20 2. Tachycardia/A-fib RVR:off cardizem, converted to sinus bradycardia. Cards following.  3. A/C combined CHF: low dose ARB per cardiology. LVEF 25-30% here, G2DD. On hydralazine and imdur also. No BB d/t bradycardia. 4. Hypoxemia: on 2 L ,admission + IS opacities by CXR. Reports SOB improved. 5. ESRD:Usual TTS schedule - missed OP HD Sat 1/22. Had HD here on 1/24 and 1/28. Labs in reasonable range, no uremic signs. HD postponed from Sat to Sun and now to Monday. BUN up >100. Prioritized for HD today and will plan for HD again tomorrow. Dialysis treatments are shortened here due to high inpatient census and staffing shortage.   6. Hypertension/volume:BP slightly elevated, due for HD today. Well under EDW. Will need lower EDW at Dc.  7. Anemia:Hgb > 12, no ESA for now. 8. Metabolic bone disease:CorrCa ok, Phos elevated. Resume home binders (Renvela). 9. T1DM: Insulin per primary  Anice Paganini, PA-C 06/19/2020, 10:40 AM  [facility] Pager: [phone]  I have seen and examined this patient and agree with plan and assessment in the above note with renal recommendations/intervention highlighted.  Continue with HD on MWF schedule for now due to covid+ shift and transition back to TTS by Thursday or Saturday (after isolation is completed).  Broadus John A Remmy Crass,MD 06/19/2020 11:38 AM

## 2020-06-19 NOTE — Progress Notes (Signed)
Occupational Therapy Treatment Patient Details Name: Paul [name] MRN: [id number] DOB: 1955/11/07 Today's Date: 06/19/2020    History of present illness This 65yo male was referred to PT on 04/27/2019 by [name], DO with weakness & gait disorder.  He had PT following bil. TTAs & TIA and successfully became ambulatory at community level. His kidney transplant is failing and is back on transplant list. He was hospitalized 4 times in 2020 with medical issues. His current prostheses delivered late 2020.   HTN, DM2, renal transplant, CKD st IV, calciphylaxis, A-Fib, bil. TTAs, TIA   OT comments  Pt progressing towards OT goals gradually with continued limitations in endurance and strength. On entry, pt with bowel incontinence with Total A needed for cleanup. Pt able to demo LB dressing to don brief and shorts at Mod A bed level, min guard for donning prosthetics sitting EOB with balance deficits noted. Pt continues to require extensive assist for bed mobility, able to demo sit to stand at Mod A x 2 with elevated bed and take steps around bedside using RW. Pt on RA, denies SOB but does endorse fatigue. Continue to recommend SNF for rehab prior to discharge home.    Follow Up Recommendations  SNF;Supervision/Assistance - 24 hour    Equipment Recommendations  None recommended by OT (well equipped)    Recommendations for Other Services      Precautions / Restrictions Precautions Precautions: Fall;Other (comment) Precaution Comments: B BKA with prosthetics in room now Restrictions Weight Bearing Restrictions: No       Mobility Bed Mobility Overal bed mobility: Needs Assistance Bed Mobility: Rolling;Supine to Sit Rolling: Supervision   Supine to sit: Max assist;+2 for physical assistance     General bed mobility comments: able to roll with supervision with rail, but needed MaxAx2 for getting up to midilne sitting from flat bed today due to generalized weakness  Transfers Overall  transfer level: Needs assistance Equipment used: Rolling walker (2 wheeled) Transfers: Sit to/from Stand Sit to Stand: Mod assist;+2 physical assistance;From elevated surface         General transfer comment: able to stand up just enough to barely clear hips from bed on standard height surface and +2 assist, needed bed elevated and still +2 assist to power all the way up to standing with RW    Balance Overall balance assessment: Needs assistance Sitting-balance support: No upper extremity supported;Feet unsupported Sitting balance-Leahy Scale: Poor Sitting balance - Comments: MinA for balance when donning prosthetics Postural control: Posterior lean Standing balance support: Bilateral upper extremity supported;During functional activity Standing balance-Leahy Scale: Poor Standing balance comment: reliant on external support and BUE support                           ADL either performed or assessed with clinical judgement   ADL Overall ADL's : Needs assistance/impaired                     Lower Body Dressing: Moderate assistance;Bed level Lower Body Dressing Details (indicate cue type and reason): Mod A overall bed level to don pull-up brief and gym shorts. assistance to don over B sides of waist. Able to don B prosthetic LEs sitting EOB at min guard due to balance deficits - fatigued quickly during this task     Toileting- Clothing Manipulation and Hygiene: Total assistance;Bed level Toileting - Clothing Manipulation Details (indicate cue type and reason): Bowel incontinence noted on entry. Total  A for cleanup of large bowel movement. Pt reports unable to tell when he has BM       General ADL Comments: Pt with bowel incontinence on entry (reports this happens at home, wears depends). Continues to have low endurance, fatiguing quickly with donning prosthetics     Vision   Vision Assessment?: No apparent visual deficits   Perception     Praxis       Cognition Arousal/Alertness: Awake/alert Behavior During Therapy: WFL for tasks assessed/performed;Flat affect Overall Cognitive Status: Impaired/Different from baseline Area of Impairment: Safety/judgement;Awareness;Problem solving                         Safety/Judgement: Decreased awareness of deficits Awareness: Emergent Problem Solving: Slow processing;Difficulty sequencing;Requires verbal cues General Comments: functional but with increased difficulty with sequencing tasks today, also with increased processing time as compared to prior sessions        Exercises     Shoulder Instructions       General Comments On RA throughout session. Pt denies SOB but endorses fatigue    Pertinent Vitals/ Pain       Pain Assessment: Faces Faces Pain Scale: Hurts a little bit Pain Location: generalized Pain Descriptors / Indicators: Discomfort Pain Intervention(s): Monitored during session;Repositioned  Home Living                                          Prior Functioning/Environment              Frequency  Min 2X/week        Progress Toward Goals  OT Goals(current goals can now be found in the care plan section)  Progress towards OT goals: Progressing toward goals  Acute Rehab OT Goals Patient Stated Goal: be able to go home from hospital OT Goal Formulation: With patient Time For Goal Achievement: 06/26/20 Potential to Achieve Goals: Good ADL Goals Pt Will Perform Grooming: with modified independence;sitting Pt Will Perform Lower Body Bathing: with supervision;sitting/lateral leans Pt Will Perform Lower Body Dressing: with supervision;sitting/lateral leans Pt Will Transfer to Toilet: with min assist;anterior/posterior transfer;bedside commode Pt/caregiver will Perform Home Exercise Program: Increased strength;Both right and left upper extremity;With theraband;Independently;With written HEP provided Additional ADL Goal #1: Pt to  demonstrate ability to monitor O2 and independently implement pursed lip breathing to maintain O2 > 88% Additional ADL Goal #2: Pt to demonstrate implementation of at least 2 energy conservation strategies during ADLs  Plan Discharge plan remains appropriate    Co-evaluation    PT/OT/SLP Co-Evaluation/Treatment: Yes Reason for Co-Treatment: Complexity of the patient's impairments (multi-system involvement);For patient/therapist safety;To address functional/ADL transfers   OT goals addressed during session: ADL's and self-care      AM-PAC OT "6 Clicks" Daily Activity     Outcome Measure   Help from another person eating meals?: A Little Help from another person taking care of personal grooming?: A Little Help from another person toileting, which includes using toliet, bedpan, or urinal?: Total Help from another person bathing (including washing, rinsing, drying)?: A Lot Help from another person to put on and taking off regular upper body clothing?: A Little Help from another person to put on and taking off regular lower body clothing?: A Lot 6 Click Score: 14    End of Session Equipment Utilized During Treatment: Gait belt;Rolling walker  OT Visit Diagnosis: Other abnormalities of  gait and mobility (R26.89);Muscle weakness (generalized) (M62.81)   Activity Tolerance Patient tolerated treatment well;Patient limited by fatigue   Patient Left in chair;with call bell/phone within reach;with chair alarm set   Nurse Communication Mobility status;Other (comment) (BM)        Time: 4784-1282 OT Time Calculation (min): 34 min  Charges: OT General Charges $OT Visit: 1 Visit OT Treatments $Self Care/Home Management : 8-22 mins  Layla Maw, OTR/L   Layla Maw 06/19/2020, 10:44 AM

## 2020-06-20 DIAGNOSIS — R7881 Bacteremia: Secondary | ICD-10-CM | POA: Diagnosis not present

## 2020-06-20 DIAGNOSIS — B952 Enterococcus as the cause of diseases classified elsewhere: Secondary | ICD-10-CM | POA: Diagnosis not present

## 2020-06-20 LAB — RENAL FUNCTION PANEL
Albumin: 1.7 g/dL — ABNORMAL LOW (ref 3.5–5.0)
Anion gap: 14 (ref 5–15)
BUN: 80 mg/dL — ABNORMAL HIGH (ref 8–23)
CO2: 19 mmol/L — ABNORMAL LOW (ref 22–32)
Calcium: 6.9 mg/dL — ABNORMAL LOW (ref 8.9–10.3)
Chloride: 104 mmol/L (ref 98–111)
Creatinine, Ser: 5.55 mg/dL — ABNORMAL HIGH (ref 0.61–1.24)
GFR, Estimated: 11 mL/min — ABNORMAL LOW (ref >60)
Glucose, Bld: 192 mg/dL — ABNORMAL HIGH (ref 70–99)
Phosphorus: 6.6 mg/dL — ABNORMAL HIGH (ref 2.5–4.6)
Potassium: 3.4 mmol/L — ABNORMAL LOW (ref 3.5–5.1)
Sodium: 137 mmol/L (ref 135–145)

## 2020-06-20 LAB — MAGNESIUM: Magnesium: 2.3 mg/dL (ref 1.7–2.4)

## 2020-06-20 LAB — GLUCOSE, CAPILLARY
Glucose-Capillary: 212 mg/dL — ABNORMAL HIGH (ref 70–99)
Glucose-Capillary: 148 mg/dL — ABNORMAL HIGH (ref 70–99)
Glucose-Capillary: 51 mg/dL — ABNORMAL LOW (ref 70–99)
Glucose-Capillary: 88 mg/dL (ref 70–99)
Glucose-Capillary: 154 mg/dL — ABNORMAL HIGH (ref 70–99)

## 2020-06-20 MED ORDER — VANCOMYCIN HCL IN DEXTROSE 750-5 MG/150ML-% IV SOLN
750.0000 mg | INTRAVENOUS | Status: DC
  Filled 2020-06-20: qty 150

## 2020-06-20 MED ORDER — INSULIN ASPART 100 UNIT/ML ~~LOC~~ SOLN
0.0000 [IU] | Freq: Three times a day (TID) | SUBCUTANEOUS | Status: DC
  Administered 2020-06-21: 3 [IU] via SUBCUTANEOUS
  Administered 2020-06-21: 5 [IU] via SUBCUTANEOUS
  Administered 2020-06-21: 3 [IU] via SUBCUTANEOUS
  Administered 2020-06-22 (×2): 5 [IU] via SUBCUTANEOUS
  Administered 2020-06-22: 8 [IU] via SUBCUTANEOUS
  Administered 2020-06-23 – 2020-06-24 (×4): 5 [IU] via SUBCUTANEOUS
  Administered 2020-06-24: 3 [IU] via SUBCUTANEOUS
  Administered 2020-06-24: 8 [IU] via SUBCUTANEOUS
  Administered 2020-06-25: 5 [IU] via SUBCUTANEOUS
  Administered 2020-06-25: 3 [IU] via SUBCUTANEOUS
  Administered 2020-06-25: 5 [IU] via SUBCUTANEOUS
  Administered 2020-06-26 (×3): 3 [IU] via SUBCUTANEOUS
  Administered 2020-06-27: 5 [IU] via SUBCUTANEOUS
  Administered 2020-06-27: 8 [IU] via SUBCUTANEOUS
  Administered 2020-06-28: 3 [IU] via SUBCUTANEOUS
  Administered 2020-06-29: 5 [IU] via SUBCUTANEOUS
  Administered 2020-06-29 – 2020-06-30 (×2): 2 [IU] via SUBCUTANEOUS
  Administered 2020-07-01: 5 [IU] via SUBCUTANEOUS
  Administered 2020-07-01 – 2020-07-02 (×2): 2 [IU] via SUBCUTANEOUS
  Administered 2020-07-02: 3 [IU] via SUBCUTANEOUS
  Administered 2020-07-03: 2 [IU] via SUBCUTANEOUS
  Administered 2020-07-05: 3 [IU] via SUBCUTANEOUS
  Administered 2020-07-07 (×3): 2 [IU] via SUBCUTANEOUS
  Administered 2020-07-08 (×2): 3 [IU] via SUBCUTANEOUS
  Administered 2020-07-08: 5 [IU] via SUBCUTANEOUS
  Administered 2020-07-09: 8 [IU] via SUBCUTANEOUS
  Administered 2020-07-09: 3 [IU] via SUBCUTANEOUS
  Administered 2020-07-09: 5 [IU] via SUBCUTANEOUS

## 2020-06-20 MED ORDER — POLYETHYLENE GLYCOL 3350 17 G PO PACK: 17.0000 g | PACK | Freq: Every day | ORAL | Status: DC | PRN

## 2020-06-20 MED ORDER — INSULIN ASPART 100 UNIT/ML ~~LOC~~ SOLN
0.0000 [IU] | Freq: Every day | SUBCUTANEOUS | Status: DC
  Administered 2020-06-21 – 2020-06-23 (×3): 2 [IU] via SUBCUTANEOUS
  Administered 2020-06-24: 4 [IU] via SUBCUTANEOUS
  Administered 2020-06-25: 3 [IU] via SUBCUTANEOUS

## 2020-06-20 NOTE — Progress Notes (Addendum)
[facility] Progress Note   Subjective:   Pt observed in room, resting and appears comfortable. No acute events overnight.   Objective Vitals:   06/19/20 1700 06/19/20 1758 06/19/20 2022 06/20/20 0505  BP: 128/65 (!) 142/70 128/66 (!) 145/68  Pulse: (!) 59 (!) 58 (!) 58 (!) 54  Resp: 17 20 17 17   Temp: 97.9 F (36.6 C) 97.9 F (36.6 C) 97.8 F (36.6 C) 97.6 F (36.4 C)  TempSrc: Oral     SpO2: 96% 95% 96% 94%  Weight: 64.9 kg     Height:       Physical Exam General: Well developed male, sleeping, appears to be in NAD  Full exam deferred today due to COVID 19 positive status.   Additional Objective Labs: Basic Metabolic Panel: Recent Labs  Lab 06/16/20 0351 06/17/20 0745 06/19/20 0811  NA 137 135 137  K 3.5 3.4* 3.6  CL 99 101 102  CO2 19* 19* 18*  GLUCOSE 187* 186* 223*  BUN 95* 66* 104*  CREATININE 6.78* 4.97* 6.36*  CALCIUM 6.6* 6.8* 7.3*  PHOS  --   --  7.7*   Liver Function Tests: Recent Labs  Lab 06/14/20 0447 06/15/20 0454 06/16/20 0351 06/19/20 0811  AST 23 31 25   --   ALT 13 18 20   --   ALKPHOS 71 74 70  --   BILITOT 1.1 0.7 0.8  --   PROT 5.0* 4.3* 4.3*  --   ALBUMIN 2.1* 1.8* 1.9* 2.0*   CBC: Recent Labs  Lab 06/14/20 0447 06/15/20 0454 06/16/20 0706 06/17/20 0745 06/19/20 0805  WBC 8.3 8.2 7.7 10.6* 13.6*  NEUTROABS 7.3 6.8 6.5  --   --   HGB 11.6* 11.7* 12.6* 12.6* 13.6  HCT 37.0* 37.0* 41.3 41.5 44.8  MCV 87.7 87.1 87.5 85.7 86.2  PLT 102* 117* 109* 117* 169   Blood Culture    Component Value Date/Time   SDES BLOOD LEFT HAND 06/13/2020 0803   SDES BLOOD LEFT HAND 06/13/2020 0803   SPECREQUEST  06/13/2020 0803    BOTTLES DRAWN AEROBIC AND ANAEROBIC Blood Culture adequate volume   SPECREQUEST  06/13/2020 0803    BOTTLES DRAWN AEROBIC AND ANAEROBIC Blood Culture adequate volume   CULT  06/13/2020 0803    NO GROWTH 5 DAYS Performed at [facility], [place] [address] [place], [place] [address]    CULT   06/13/2020 0803    NO GROWTH 5 DAYS Performed at [facility] [address] [place], [place] [address]    REPTSTATUS 06/18/2020 FINAL 06/13/2020 0803   REPTSTATUS 06/18/2020 FINAL 06/13/2020 0803   CBG: Recent Labs  Lab 06/18/20 2107 06/19/20 0654 06/19/20 1106 06/19/20 1819 06/19/20 2020  GLUCAP 125* 206* 225* 211* 176*   Medications: . sodium chloride    . [START ON 06/21/2020] vancomycin     . apixaban  5 mg Oral BID  . aspirin  81 mg Oral Daily  . atorvastatin  10 mg Oral Q M,W,F  . bisacodyl  10 mg Oral Daily  . Chlorhexidine Gluconate Cloth  6 each Topical Q0600  . docusate sodium  100 mg Oral BID  . hydrALAZINE  10 mg Oral TID  . insulin aspart  0-20 Units Subcutaneous TID WC  . insulin glargine  10 Units Subcutaneous Daily  . isosorbide mononitrate  15 mg Oral Daily  . lipase/protease/amylase  12,000 Units Oral TID AC  . magnesium chloride  2 tablet Oral BID  .  metoprolol succinate  25 mg Oral Daily  . pantoprazole  80 mg Oral Daily  . polyethylene glycol  17 g Oral Daily  . predniSONE  30 mg Oral Q breakfast   Followed by  . [START ON 06/22/2020] predniSONE  20 mg Oral Q breakfast   Followed by  . [START ON 06/24/2020] predniSONE  10 mg Oral Q breakfast  . sertraline  50 mg Oral QHS  . sodium chloride flush  3 mL Intravenous Q12H  . tacrolimus  2 mg Oral BID  . tamsulosin  0.4 mg Oral QPC supper    Dialysis Orders: TTS NW 3.5h 66kh 3K/2.25 bath RFA AVF Hep none - hect 2 ug - mircera 200 q2 , last 1/13  CXR 1/23 - IMPRESSION: Diffuse interstitial prominence with streaky interstitial opacities within the mid to lower lung fields and small left pleural effusion. Findings may reflect multifocal atypical/viral infection versus pulmonary edema.  Assessment/Plan: 1. COVID-19 pneumonia: With O2 requirement. Per admitting team. First positive test was 06/12/19. Remains on isolation until 06/22/20 2. Tachycardia/A-fib RVR:off cardizem, converted to  sinus bradycardia. Cards following.  3. A/C combined CHF: low dose ARB per cardiology. LVEF 25-30% here, G2DD. On hydralazine and imdur also. No BB d/t bradycardia. 4. Hypoxemia: on 2 L ,admission + IS opacities by CXR. Reports SOB improved. 5. ESRD:Usual TTS schedule - missed OP HD Sat 1/22. Had HD here on 1/24 and 1/28. Labs in reasonable range, no uremic signs.HD postponed from Sat to Sun and now to Monday. Repeat RFP tomorrow, may be able to wait for dialysis until Thursday to get back on TTS schedule. Dialysis treatments are shortened here due to high inpatient census and staffing shortage.   6. Hypertension/volume:BP well controlled s/p HD. Well under EDW. Will need lower EDW at Dc.  7. Anemia:Hgb > 12, no ESA for now. 8. Metabolic bone disease:CorrCa ok, Phos elevated. Resume home binders (Renvela). 9. T1DM: Insulin per primary  Anice Paganini, PA-C 06/20/2020, 10:42 AM  [facility] Pager: [phone]  I have seen and examined this patient and agree with plan and assessment in the above note with renal recommendations/intervention highlighted.  Hopefully we can wait for HD on 06/22/20 after he is off of respiratory isolation for next HD session.  Will follow labs and oxygen requirements.  Broadus John A Gil Ingwersen,MD 06/20/2020 11:13 AM

## 2020-06-20 NOTE — Progress Notes (Signed)
Inpatient Diabetes Program Recommendations  AACE/ADA: New Consensus Statement on Inpatient Glycemic Control (2015)  Target Ranges:  Prepandial:   less than 140 mg/dL      Peak postprandial:   less than 180 mg/dL (1-2 hours)      Critically ill patients:  140 - 180 mg/dL   Lab Results  Component Value Date   GLUCAP 176 (H) 06/19/2020   HGBA1C 7.5 (H) 06/12/2020    Review of Glycemic Control Results for Paul [name], Paul [name] (MRN [id number]) as of 06/20/2020 10:50  Ref. Range 06/18/2020 21:07 06/19/2020 06:54 06/19/2020 11:06 06/19/2020 18:19 06/19/2020 20:20  Glucose-Capillary Latest Ref Range: 70 - 99 mg/dL 125 (H) 206 (H) 225 (H) 211 (H) 176 (H)   Diabetes history: DM 2 Outpatient Diabetes medications:  Basaglar 8 units daily, Humalog 2-3 units tid with meals Current orders for Inpatient glycemic control:  Novolog resistant tid with meals  Lantus 10 units daily Prednisone 30 mg daily Inpatient Diabetes Program Recommendations:   Please consider reducing Novolog correction to sensitive tid with meals and add Novolog 2 units tid with meals (hold if patient eats less than 50% or NPO).   Thanks,  Adah Perl, RN, BC-ADM Inpatient Diabetes Coordinator Pager [phone] (8a-5p)

## 2020-06-20 NOTE — Progress Notes (Signed)
[facility] Progress Note  Patient: Paul [name]    OQH:[id number]  DOA: 06/11/2020     Date of Service: the patient was seen and examined on 06/20/2020  Brief hospital course: 65 year old with history of paroxysmal A. fib, PAD, ESRD with failed renal transplant on HD TTS, DM2, diastolic CHF presented with complaints of shortness of breath after missing dialysis.  COVID-19 positive.  Chest x-ray showed multifocal opacities with left sided pleural effusion.  He was found to have COVID-19 pneumonia, atrial fibrillation with RVR and concerns of CHF exacerbation. Blood cultures growing Enterococcus started on vancomycin.  Infectious disease consulted.   Currently plan is continue antibiotics and arrange for safe discharge at [facility].  Assessment and Plan: Acute respiratory distress Acute hypoxic respiratory failure, POA 88% on room air on admission Multiple etiology given underlying COVID-19 pneumonia, RVR, fluid overload from missing dialysis.   Continue supplemental oxygen as needed. Continue incentive spirometry. Currently on room air.  Enterococcus bacteremia Sepsis POA Potential UA as a source. Met SIRS criteria on admission with tachycardia tachypnea as well as fever and hypoxia. IV vancomycin started ID following 2D echocardiogram no vegetation. EF 25-30%.  Surveillance cultures ordered 1/25, so far negative. ID recommends 2 weeks of IV antibiotics.  No TEE.  Last dose 2/7.  COVID-19 pneumonia Chest x-ray showing bilateral opacities.  Pulmonary edema Treated Solu-Medrol, Remdesivir, Procalcitonin-0.44 Now on prednisone taper. BNP- >4500 Incentive spirometer, flutter valve First positive test on 06/11/2020.  Out of isolation on 09/21/6566  Acute metabolic encephalopathy-resolved No acute findings but it shows interval lacunar infarct? On aspirin and statin LDL 64, A1c 7.5 UA-positive TSH, B12 and folate normal  ESRD on hemodialysis, failed renal transplant.  TTS Seen by  nephrology.  HD plans for nephrology  Acute systolic CHF on chronic diastolic CHF. Echo this admission EF 25%. Echo 01/2019-EF 65%.   Presented with volume overload. Cardiology consulted due to acute drop in the EF which is likely secondary to tachycardia as well as sepsis. Cardiology will initiate GDMT, Currently on aspirin, Lipitor, hydralazine, Imdur, Toprol. Mild bradycardia seen on the telemetry scan. Repeat follow-up echocardiogram in the clinic. Ischemic work-up outpatient if no improvement in EF. Appreciate assistance.  Paroxysmal atrial fibrillation with RVR Tachybradycardia syndrome On Norvasc and Lopressor. Ziio patch was ordered outpatient.  On home Eliquis. Currently on cardioprotective medication for low EF. Initially required Cardizem drip.  Diabetes mellitus type 2, mild hyperglycemia secondary to steroid use Increase Lantus 10 units daily.  Insulin sliding scale and Accu-Chek. Monitor for hypoglycemia now that the steroids are reducing.  Essential hypertension Medication have been adjusted.  Blood pressure stable.  GERD PPI  Depression Zoloft at bedtime  BPH Flomax  Dysphagia. Seen by speech therapy Modified barium swallow performed. Was on dysphagia 2 diet nectar thick liquid. On reevaluation currently on regular diet.  Diet: Renal diet DVT Prophylaxis:    apixaban (ELIQUIS) tablet 5 mg    Advance goals of care discussion: Full code  Family Communication: no family was present at bedside, at the time of interview.  Discussed with wife on the phone on 1/28.  Left a voicemail on 2/1.  Disposition:  Status is: Inpatient  Remains inpatient appropriate because:IV treatments appropriate due to intensity of illness or inability to take PO  Dispo: The patient is from: Home              Anticipated d/c is to: SNF  Anticipated d/c date is: 1 day              Patient currently medically stable   Difficult to place patient  No  Subjective: No acute complaint. No nausea and vomiting.  RN reported diarrhea from stool softener.  Bowel regimen modified.  Physical Exam:  General: Appear in mild distress, no Rash; Oral Mucosa Clear, moist. no Abnormal Neck Mass Or lumps, Conjunctiva normal  Cardiovascular: S1 and S2 Present, no Murmur, Respiratory: good respiratory effort, Bilateral Air entry present and CTA, no Crackles, no wheezes Abdomen: Bowel Sound present, Soft and no tenderness Extremities: Bilateral BKA  Neurology: alert and oriented to time, place, and person affect appropriate. no new focal deficit Gait not checked due to patient safety concerns  Vitals:   06/19/20 1758 06/19/20 2022 06/20/20 0505 06/20/20 1045  BP: (!) 142/70 128/66 (!) 145/68 140/70  Pulse: (!) 58 (!) 58 (!) 54 60  Resp: $Remo'20 17 17 18  'VkJkb$ Temp: 97.9 F (36.6 C) 97.8 F (36.6 C) 97.6 F (36.4 C) 97.9 F (36.6 C)  TempSrc:    Oral  SpO2: 95% 96% 94% 96%  Weight:      Height:        Intake/Output Summary (Last 24 hours) at 06/20/2020 1957 Last data filed at 06/20/2020 1800 Gross per 24 hour  Intake 480 ml  Output 0 ml  Net 480 ml   Filed Weights   06/16/20 1038 06/19/20 1425 06/19/20 1700  Weight: 62.1 kg 66.8 kg 64.9 kg    Data Reviewed: I have personally reviewed and interpreted daily labs, tele strips, imaging. I reviewed all nursing notes, pharmacy notes, vitals, pertinent old records I have discussed plan of care as described above with RN and patient/family.  CBC: Recent Labs  Lab 06/14/20 0447 06/15/20 0454 06/16/20 0706 06/17/20 0745 06/19/20 0805  WBC 8.3 8.2 7.7 10.6* 13.6*  NEUTROABS 7.3 6.8 6.5  --   --   HGB 11.6* 11.7* 12.6* 12.6* 13.6  HCT 37.0* 37.0* 41.3 41.5 44.8  MCV 87.7 87.1 87.5 85.7 86.2  PLT 102* 117* 109* 117* 734   Basic Metabolic Panel: Recent Labs  Lab 06/14/20 0447 06/15/20 0454 06/16/20 0351 06/17/20 0745 06/19/20 0811  NA 138 139 137 135 137  K 3.2* 3.6 3.5 3.4* 3.6  CL  100 102 99 101 102  CO2 21* 20* 19* 19* 18*  GLUCOSE 164* 148* 187* 186* 223*  BUN 58* 78* 95* 66* 104*  CREATININE 5.47* 6.42* 6.78* 4.97* 6.36*  CALCIUM 7.3* 6.6* 6.6* 6.8* 7.3*  MG 2.2 2.1 2.2 2.0  --   PHOS  --   --   --   --  7.7*    Studies: No results found.  Scheduled Meds: . apixaban  5 mg Oral BID  . aspirin  81 mg Oral Daily  . atorvastatin  10 mg Oral Q M,W,F  . Chlorhexidine Gluconate Cloth  6 each Topical Q0600  . hydrALAZINE  10 mg Oral TID  . [START ON 06/21/2020] insulin aspart  0-15 Units Subcutaneous TID WC  . insulin aspart  0-5 Units Subcutaneous QHS  . isosorbide mononitrate  15 mg Oral Daily  . lipase/protease/amylase  12,000 Units Oral TID AC  . magnesium chloride  2 tablet Oral BID  . metoprolol succinate  25 mg Oral Daily  . pantoprazole  80 mg Oral Daily  . predniSONE  30 mg Oral Q breakfast   Followed by  . [START ON 06/22/2020] predniSONE  20 mg Oral Q breakfast   Followed by  . [START ON 06/24/2020] predniSONE  10 mg Oral Q breakfast  . sertraline  50 mg Oral QHS  . sodium chloride flush  3 mL Intravenous Q12H  . tacrolimus  2 mg Oral BID  . tamsulosin  0.4 mg Oral QPC supper   Continuous Infusions: . sodium chloride    . [START ON 06/21/2020] vancomycin     PRN Meds: sodium chloride, acetaminophen, chlorpheniramine-HYDROcodone, guaiFENesin-dextromethorphan, Ipratropium-Albuterol, polyethylene glycol, sodium chloride flush, traMADol  Time spent: 35 minutes  Author: Berle Mull, MD [facility] 06/20/2020 7:57 PM  To reach On-call, see care teams to locate the attending and reach out via [email or web address]. Between 7PM-7AM, please contact night-coverage If you still have difficulty reaching the attending provider, please page the [facility] (Director on Call) for [facility] on amion for assistance.

## 2020-06-21 DIAGNOSIS — N186 End stage renal disease: Secondary | ICD-10-CM | POA: Diagnosis not present

## 2020-06-21 DIAGNOSIS — U071 COVID-19: Secondary | ICD-10-CM | POA: Diagnosis not present

## 2020-06-21 DIAGNOSIS — R7881 Bacteremia: Secondary | ICD-10-CM | POA: Diagnosis not present

## 2020-06-21 DIAGNOSIS — I5031 Acute diastolic (congestive) heart failure: Secondary | ICD-10-CM | POA: Diagnosis not present

## 2020-06-21 LAB — CBC
HCT: 41.5 % (ref 39.0–52.0)
Hemoglobin: 12.8 g/dL — ABNORMAL LOW (ref 13.0–17.0)
MCH: 26.8 pg (ref 26.0–34.0)
MCHC: 30.8 g/dL (ref 30.0–36.0)
MCV: 87 fL (ref 80.0–100.0)
Platelets: 139 10*3/uL — ABNORMAL LOW (ref 150–400)
RBC: 4.77 MIL/uL (ref 4.22–5.81)
RDW: 20.5 % — ABNORMAL HIGH (ref 11.5–15.5)
WBC: 10.1 10*3/uL (ref 4.0–10.5)
nRBC: 0 % (ref 0.0–0.2)

## 2020-06-21 LAB — GLUCOSE, CAPILLARY
Glucose-Capillary: 180 mg/dL — ABNORMAL HIGH (ref 70–99)
Glucose-Capillary: 213 mg/dL — ABNORMAL HIGH (ref 70–99)
Glucose-Capillary: 173 mg/dL — ABNORMAL HIGH (ref 70–99)
Glucose-Capillary: 238 mg/dL — ABNORMAL HIGH (ref 70–99)

## 2020-06-21 LAB — RENAL FUNCTION PANEL
Albumin: 1.8 g/dL — ABNORMAL LOW (ref 3.5–5.0)
Anion gap: 18 — ABNORMAL HIGH (ref 5–15)
BUN: 84 mg/dL — ABNORMAL HIGH (ref 8–23)
CO2: 15 mmol/L — ABNORMAL LOW (ref 22–32)
Calcium: 7.1 mg/dL — ABNORMAL LOW (ref 8.9–10.3)
Chloride: 103 mmol/L (ref 98–111)
Creatinine, Ser: 5.62 mg/dL — ABNORMAL HIGH (ref 0.61–1.24)
GFR, Estimated: 10 mL/min — ABNORMAL LOW (ref >60)
Glucose, Bld: 193 mg/dL — ABNORMAL HIGH (ref 70–99)
Phosphorus: 7 mg/dL — ABNORMAL HIGH (ref 2.5–4.6)
Potassium: 3.3 mmol/L — ABNORMAL LOW (ref 3.5–5.1)
Sodium: 136 mmol/L (ref 135–145)

## 2020-06-21 MED ORDER — VANCOMYCIN HCL IN DEXTROSE 750-5 MG/150ML-% IV SOLN
750.0000 mg | INTRAVENOUS | Status: AC
  Administered 2020-06-24: 750 mg via INTRAVENOUS

## 2020-06-21 MED ORDER — CHLORHEXIDINE GLUCONATE CLOTH 2 % EX PADS
6.0000 | MEDICATED_PAD | Freq: Every day | CUTANEOUS | Status: DC
  Administered 2020-06-21: 6 via TOPICAL

## 2020-06-21 NOTE — Progress Notes (Signed)
Pharmacy Antibiotic Note  Paul [name] is a 66 y.o. male admitted on 06/11/2020 with bacteremia.   Continues on Vancomycin for 2 week course, through 06/26/20.  ESRD - usual TTS. Last HD 1/31 (Monday) and Vanc given. Next HD planned 2/3 to get back on TTS schedule.  Repeat blood cultures 1/25 negative.   Plan: Vancomycin 750 mg IV after HD MWF modified to TTS after HD. Next dose 2/3.   To stop Vancomycin on 2/7 (Monday), so last Vancomycin dose will be due on 2/5 (Saturday).  Height: 6' (182.9 cm) Weight: 64.9 kg (143 lb 1.3 oz) IBW/kg (Calculated) : 77.6  Temp (24hrs), Avg:98.1 F (36.7 C), Min:97.6 F (36.4 C), Max:98.4 F (36.9 C)  Recent Labs  Lab 06/15/20 0454 06/16/20 0351 06/16/20 0706 06/17/20 0745 06/19/20 0805 06/19/20 0811 06/20/20 2220 06/21/20 0252  WBC 8.2  --  7.7 10.6* 13.6*  --   --  10.1  CREATININE 6.42* 6.78*  --  4.97*  --  6.36* 5.55* 5.62*     Allergies  Allergen Reactions  . Metformin And Related Other (See Comments)    Has kidney problems   Antimicrobials this admission: Vancomycin 1/24 >>  Microbiology results: 1/23 Blood: Enterococcus faecalis & Staph epi 1/25 blood: negative   Arty Baumgartner, [place] 06/21/2020 3:11 PM

## 2020-06-21 NOTE — Progress Notes (Signed)
  Speech Language Pathology Treatment: Dysphagia  Patient Details Name: JACION DISMORE MRN: [id number] DOB: [birth year] Today's Date: 06/21/2020 Time: 1217-1229 SLP Time Calculation (min) (ACUTE ONLY): 12 min  Assessment / Plan / Recommendation Clinical Impression  Pt demonstrates adequate tolerance of regular solids and thin liquids. Prolonged mastication noted, but this is an appropriate compensatory strategy to address pharyngeal residue. SLP encouraged following solids with liquids with a verbal cue for carry over. No further SLP interventions needed will sign off.   HPI HPI: LORENA CLEARMAN is a 65 y.o. male with medical history significant of of pAF (recurrence 12/2019 in setting of septic shock), PAD, ESRD with failed renal transplant on HD Tu[facility],  DM, diastolicCVA who presents to the ED via EMS with a chief complaint of missed dialysis, COVID-positive. Patient reports being diagnosed with Covid 19 x a few days ago. Symptoms have been kept under control at home until he felt weaker today. Reports overall malaise along with shortness of breath. He is on dialysis [facility] but missed yesterday due to "bad weather".      SLP Plan  Continue with current plan of care       Recommendations  Diet recommendations: Regular;Thin liquid Liquids provided via: Cup;Straw Medication Administration: Whole meds with liquid Supervision: Patient able to self feed Compensations: Slow rate;Small sips/bites;Follow solids with liquid Postural Changes and/or Swallow Maneuvers: Seated upright 90 degrees                Plan: Continue with current plan of care       GO              Herbie Baltimore, MA [place] Pager [phone] Office [phone]   Lynann Beaver 06/21/2020, 12:56 PM

## 2020-06-21 NOTE — Progress Notes (Signed)
Inpatient Diabetes Program Recommendations  AACE/ADA: New Consensus Statement on Inpatient Glycemic Control (2015)  Target Ranges:  Prepandial:   less than 140 mg/dL      Peak postprandial:   less than 180 mg/dL (1-2 hours)      Critically ill patients:  140 - 180 mg/dL   Lab Results  Component Value Date   GLUCAP 213 (H) 06/21/2020   HGBA1C 7.5 (H) 06/12/2020   Diabetes history: DM 2 Outpatient Diabetes medications:  Basaglar 8 units daily, Humalog 2-3 units tid with meals Current orders for Inpatient glycemic control:  Novolog moderate tid with meals and HS  Inpatient Diabetes Program Recommendations:    Note that Lantus stopped due to low blood sugar on 06/20/20.  Consider resuming Lantus 8 units daily and reduce Novolog correction to sensitive tid with meals and HS scale.  Also consider adding Novolog 2 units tid with meals to cover CHO intake (hold if patient eats less than 50% or NPO).   Thanks,  Adah Perl, RN, BC-ADM Inpatient Diabetes Coordinator Pager [phone] (8a-5p)

## 2020-06-21 NOTE — Progress Notes (Signed)
PROGRESS NOTE    Paul [name]  ZOX:[id number] DOB: 04-20-1956 DOA: 06/11/2020 PCP: [name], MD   Brief Narrative: Paul [name] is a 65 y.o. male with a history of paroxysmal A. fib, PAD, ESRD with failed renal transplant on HD TTS, DM2, diastolic CHF. Patient presented secondary to dyspnea in setting of missed dialysis and found to be COVID-19 positive. He also was found to have an enterococcus bacteremia and pneumonia from COVID-19 infection requiring treatment. While admitted, an Transthoracic Echocardiogram was significant for severely reduced EF heart failure.   Assessment & Plan:  Principal Problem:   Bacteremia due to Enterococcus Active Problems:   ESRD on hemodialysis (HCC)   DM (diabetes mellitus) (HCC)   GERD (gastroesophageal reflux disease)   PAF (paroxysmal atrial fibrillation) (HCC)   COVID-19 virus infection   Acute heart failure with preserved ejection fraction (HFpEF) ([place])   Acute respiratory failure with hypoxia Secondary to COVID-19 pneumonia in addition to fluid overload. Resolved.  Enterococcus faecalis bacteremia Likely urine source; no urine culture obtained. Transthoracic Echocardiogram obtained and was significant for an EF of 25-30% with no evidence of valvular vegetations. ID recommended a total of 2 weeks of IV antibiotics. Repeat blood cultures (1/25) no growth to date. Last day of antibiotics on 06/26/2020. -Continue Vancomycin IV  Sepsis Present on admission. Secondary to above. Management of infection as mentioned above.  COVID-19 pneumonia Patient treated with Remdesivir IV for a total of 5 days. He was also treated with Solu-medrol which was transitioned to prednisone.   Acute metabolic encephalopathy Resolved. Possibly secondary to infection.  ESRD on HD Nephrology consulted.  Paroxysmal atrial fibrillation with RVR Tachybradycardia syndrome RVR resolved. -Continue metoprolol and Eliquis  Acute systolic heart  failure Chronic diastolic heart failure EF of 25% on most recent Transthoracic Echocardiogram. Cardiology consulted and recommended hydralazine, Imdur. Beta-blocker held secondary to bradycardia and Entresto held secondary to renal disease. Recommend repeat Transthoracic Echocardiogram in 2-3 months for possible need for ischemic workup.  Diabetes mellitus, type 2 -Continue SSI  Primary hypertension -Continue metoprolol, Imdur  GERD -Continue Protonix  Hyperlipidemia -Continue Lipitor  Depression -Continue Zoloft  BPH -Continue Flomax  Dysphagia Evaluated by speech therapy. Recommendation for regular consistency diet.  Pressure injury Sacrum, POA   DVT prophylaxis: Eliquis Code Status:   Code Status: Full Code Family Communication: None at bedside Disposition Plan: Discharge to SNF when bed is available. Medically stable for discharge.   Consultants:   Nephrology  Cardilogy  Infectious disease  Procedures:   TRANSTHORACIC ECHOCARDIOGRAM (06/13/2020) IMPRESSIONS    1. Limited study to assess LV function; not all views obtained.  2. Left ventricular ejection fraction, by estimation, is 25 to 30%. The  left ventricle has severely decreased function. The left ventricle  demonstrates global hypokinesis. Left ventricular diastolic parameters are  consistent with Grade II diastolic  dysfunction (pseudonormalization).  3. Right ventricular systolic function is mildly reduced. The right  ventricular size is normal. There is normal pulmonary artery systolic  pressure.  4. Left atrial size was moderately dilated.  5. Right atrial size was mildly dilated.  6. Moderate pleural effusion in the left lateral region.  7. The mitral valve is normal in structure. Mild mitral valve  regurgitation. No evidence of mitral stenosis.  8. The aortic valve is tricuspid. Aortic valve regurgitation is mild.  Mild to moderate aortic valve sclerosis/calcification is present,  without  any evidence of aortic stenosis.  9. The inferior vena cava is normal in size  with greater than 50%  respiratory variability, suggesting right atrial pressure of 3 mmHg.  Antimicrobials:  Vancomycin   Remdesivir  Subjective: No concerns today.  Objective: Vitals:   06/20/20 1700 06/20/20 2057 06/21/20 0630 06/21/20 1159  BP: 140/68 125/65 (!) 166/74 (!) 143/68  Pulse: 62 (!) 54 (!) 50 100  Resp: 18 18 16 17   Temp: 98 F (36.7 C) 97.6 F (36.4 C) 98.4 F (36.9 C) 98.2 F (36.8 C)  TempSrc: Oral Oral    SpO2: 98% 99% 96% 94%  Weight:      Height:        Intake/Output Summary (Last 24 hours) at 06/21/2020 1530 Last data filed at 06/21/2020 1307 Gross per 24 hour  Intake 900 ml  Output 0 ml  Net 900 ml   Filed Weights   06/16/20 1038 06/19/20 1425 06/19/20 1700  Weight: 62.1 kg 66.8 kg 64.9 kg    Examination:  General exam: Appears calm and comfortable Respiratory system: Clear to auscultation. Respiratory effort normal. Cardiovascular system: S1 & S2 heard, RRR. No murmurs, rubs, gallops or clicks. Gastrointestinal system: Abdomen is nondistended, soft and nontender. No organomegaly or masses felt. Normal bowel sounds heard. Central nervous system: Alert and oriented. No focal neurological deficits. Musculoskeletal: No edema. Bilateral BKAs Skin: No cyanosis. No rashes Psychiatry: Judgement and insight appear normal. Mood & affect appropriate.     Data Reviewed: I have personally reviewed following labs and imaging studies  CBC Lab Results  Component Value Date   WBC 10.1 06/21/2020   RBC 4.77 06/21/2020   HGB 12.8 (L) 06/21/2020   HCT 41.5 06/21/2020   MCV 87.0 06/21/2020   MCH 26.8 06/21/2020   PLT 139 (L) 06/21/2020   MCHC 30.8 06/21/2020   RDW 20.5 (H) 06/21/2020   LYMPHSABS 0.6 (L) 06/16/2020   MONOABS 0.5 06/16/2020   EOSABS 0.0 06/16/2020   BASOSABS 0.0 28/41/3244     Last metabolic panel Lab Results  Component Value Date   NA  136 06/21/2020   K 3.3 (L) 06/21/2020   CL 103 06/21/2020   CO2 15 (L) 06/21/2020   BUN 84 (H) 06/21/2020   CREATININE 5.62 (H) 06/21/2020   GLUCOSE 193 (H) 06/21/2020   GFRNONAA 10 (L) 06/21/2020   GFRAA 20 (L) 01/27/2020   CALCIUM 7.1 (L) 06/21/2020   PHOS 7.0 (H) 06/21/2020   PROT 4.3 (L) 06/16/2020   ALBUMIN 1.8 (L) 06/21/2020   BILITOT 0.8 06/16/2020   ALKPHOS 70 06/16/2020   AST 25 06/16/2020   ALT 20 06/16/2020   ANIONGAP 18 (H) 06/21/2020    CBG (last 3)  Recent Labs    06/20/20 2057 06/21/20 0630 06/21/20 1305  GLUCAP 154* 180* 213*     GFR: Estimated Creatinine Clearance: 11.9 mL/min (A) (by C-G formula based on SCr of 5.62 mg/dL (H)).  Coagulation Profile: No results for input(s): INR, PROTIME in the last 168 hours.  Recent Results (from the past 240 hour(s))  MRSA PCR Screening     Status: None   Collection Time: 06/12/20 12:52 AM   Specimen: Nasal Mucosa; Nasopharyngeal  Result Value Ref Range Status   MRSA by PCR NEGATIVE NEGATIVE Final    Comment:        The GeneXpert MRSA Assay (FDA approved for NASAL specimens only), is one component of a comprehensive MRSA colonization surveillance program. It is not intended to diagnose MRSA infection nor to guide or monitor treatment for MRSA infections. Performed at [facility],  [address], [place], [place] [address]   Culture, blood (routine x 2)     Status: None   Collection Time: 06/13/20  8:03 AM   Specimen: BLOOD LEFT HAND  Result Value Ref Range Status   Specimen Description BLOOD LEFT HAND  Final   Special Requests   Final    BOTTLES DRAWN AEROBIC AND ANAEROBIC Blood Culture adequate volume   Culture   Final    NO GROWTH 5 DAYS Performed at [facility], [place] [address], [place], [place] [address]    Report Status 06/18/2020 FINAL  Final  Culture, blood (routine x 2)     Status: None   Collection Time: 06/13/20  8:03 AM   Specimen: BLOOD LEFT HAND  Result Value Ref Range  Status   Specimen Description BLOOD LEFT HAND  Final   Special Requests   Final    BOTTLES DRAWN AEROBIC AND ANAEROBIC Blood Culture adequate volume   Culture   Final    NO GROWTH 5 DAYS Performed at [facility], [place] [address] [place], [place] [address]    Report Status 06/18/2020 FINAL  Final        Radiology Studies: No results found.      Scheduled Meds: . apixaban  5 mg Oral BID  . aspirin  81 mg Oral Daily  . atorvastatin  10 mg Oral Q M,W,F  . Chlorhexidine Gluconate Cloth  6 each Topical Q0600  . hydrALAZINE  10 mg Oral TID  . insulin aspart  0-15 Units Subcutaneous TID WC  . insulin aspart  0-5 Units Subcutaneous QHS  . isosorbide mononitrate  15 mg Oral Daily  . lipase/protease/amylase  12,000 Units Oral TID AC  . magnesium chloride  2 tablet Oral BID  . metoprolol succinate  25 mg Oral Daily  . pantoprazole  80 mg Oral Daily  . [START ON 06/22/2020] predniSONE  20 mg Oral Q breakfast   Followed by  . [START ON 06/24/2020] predniSONE  10 mg Oral Q breakfast  . sertraline  50 mg Oral QHS  . sodium chloride flush  3 mL Intravenous Q12H  . tacrolimus  2 mg Oral BID  . tamsulosin  0.4 mg Oral QPC supper   Continuous Infusions: . sodium chloride    . [START ON 06/22/2020] vancomycin       LOS: 10 days     Cordelia Poche, MD [facility] 06/21/2020, 3:30 PM  If 7PM-7AM, please contact night-coverage [email or web address]

## 2020-06-21 NOTE — Progress Notes (Addendum)
[facility] Progress Note   Subjective:   Pt in bed, resting, appears comfortable. Had HD yesterday with net UF 1L. Awaiting SNF.   Objective Vitals:   06/20/20 1045 06/20/20 1700 06/20/20 2057 06/21/20 0630  BP: 140/70 140/68 125/65 (!) 166/74  Pulse: 60 62 (!) 54 (!) 50  Resp: 18 18 18 16   Temp: 97.9 F (36.6 C) 98 F (36.7 C) 97.6 F (36.4 C) 98.4 F (36.9 C)  TempSrc: Oral Oral Oral   SpO2: 96% 98% 99% 96%  Weight:      Height:       Physical Exam General: Well developed male, sleeping, appears to be in NAD  Full exam deferred today due to COVID 19 positive status.  Additional Objective Labs: Basic Metabolic Panel: Recent Labs  Lab 06/19/20 0811 06/20/20 2220 06/21/20 0252  NA 137 137 136  K 3.6 3.4* 3.3*  CL 102 104 103  CO2 18* 19* 15*  GLUCOSE 223* 192* 193*  BUN 104* 80* 84*  CREATININE 6.36* 5.55* 5.62*  CALCIUM 7.3* 6.9* 7.1*  PHOS 7.7* 6.6* 7.0*   Liver Function Tests: Recent Labs  Lab 06/15/20 0454 06/16/20 0351 06/19/20 0811 06/20/20 2220 06/21/20 0252  AST 31 25  --   --   --   ALT 18 20  --   --   --   ALKPHOS 74 70  --   --   --   BILITOT 0.7 0.8  --   --   --   PROT 4.3* 4.3*  --   --   --   ALBUMIN 1.8* 1.9* 2.0* 1.7* 1.8*   CBC: Recent Labs  Lab 06/15/20 0454 06/16/20 0706 06/17/20 0745 06/19/20 0805 06/21/20 0252  WBC 8.2 7.7 10.6* 13.6* 10.1  NEUTROABS 6.8 6.5  --   --   --   HGB 11.7* 12.6* 12.6* 13.6 12.8*  HCT 37.0* 41.3 41.5 44.8 41.5  MCV 87.1 87.5 85.7 86.2 87.0  PLT 117* 109* 117* 169 139*   Blood Culture    Component Value Date/Time   SDES BLOOD LEFT HAND 06/13/2020 0803   SDES BLOOD LEFT HAND 06/13/2020 0803   SPECREQUEST  06/13/2020 0803    BOTTLES DRAWN AEROBIC AND ANAEROBIC Blood Culture adequate volume   SPECREQUEST  06/13/2020 0803    BOTTLES DRAWN AEROBIC AND ANAEROBIC Blood Culture adequate volume   CULT  06/13/2020 0803    NO GROWTH 5 DAYS Performed at [facility],  [place] [address], [place], [place] [address]    CULT  06/13/2020 0803    NO GROWTH 5 DAYS Performed at [facility] [address], [place],  [address]    REPTSTATUS 06/18/2020 FINAL 06/13/2020 0803   REPTSTATUS 06/18/2020 FINAL 06/13/2020 0803   CBG: Recent Labs  Lab 06/20/20 1127 06/20/20 1631 06/20/20 1801 06/20/20 2057 06/21/20 0630  GLUCAP 148* 51* 88 154* 180*   Medications: . sodium chloride    . vancomycin     . apixaban  5 mg Oral BID  . aspirin  81 mg Oral Daily  . atorvastatin  10 mg Oral Q M,W,F  . Chlorhexidine Gluconate Cloth  6 each Topical Q0600  . hydrALAZINE  10 mg Oral TID  . insulin aspart  0-15 Units Subcutaneous TID WC  . insulin aspart  0-5 Units Subcutaneous QHS  . isosorbide mononitrate  15 mg Oral Daily  . lipase/protease/amylase  12,000 Units Oral TID AC  . magnesium chloride  2  tablet Oral BID  . metoprolol succinate  25 mg Oral Daily  . pantoprazole  80 mg Oral Daily  . [START ON 06/22/2020] predniSONE  20 mg Oral Q breakfast   Followed by  . [START ON 06/24/2020] predniSONE  10 mg Oral Q breakfast  . sertraline  50 mg Oral QHS  . sodium chloride flush  3 mL Intravenous Q12H  . tacrolimus  2 mg Oral BID  . tamsulosin  0.4 mg Oral QPC supper    Dialysis Orders: TTS NW 3.5h 66kh 3K/2.25 bath RFA AVF Hep none - hect 2 ug - mircera 200 q2 , last 1/13  CXR 1/23 - IMPRESSION: Diffuse interstitial prominence with streaky interstitial opacities within the mid to lower lung fields and small left pleural effusion. Findings may reflect multifocal atypical/viral infection versus pulmonary edema.  Assessment/Plan: 1. COVID-19 pneumonia: per admitting team.First positive test was 06/12/19. Remains on isolation until 06/22/20 2. Enterococcus bacteremia: ID recommended 2 weeks of IV abx, last dose 2/7. Can continue vancomycin with outpatient HD if patient is discharged before 2/7. 3. Tachycardia/A-fib RVR:off cardizem, converted to sinus  bradycardia.  4. A/C combined CHF: LVEF 25-30% here, G2DD. On aspirin, lipitor, hydralazine, imdur an Toprol with mild bradycardia.  5. Hypoxemia: on 2 L ,admission + IS opacities by CXR.Reports SOB improved. 6. ESRD:Usual TTS schedule. Last HD was postponed to Monday. No hyperkalemia, BUN stable, remains on RA. Will wait until tomorrow for next dialysis to get back on schedule. Dialysis treatments are shortened here due to high inpatient census and staffing shortage. 7. Hypokalemia: K+ 3.3 this AM after dialysis yesterday. Recommend no supplement for now, will use 4K bath with HD tomorrow.   8. Hypertension/volume:BPwell controlled. Well under EDW. Will need lower EDW at Dc.  9. Anemia:Hgb > 12, no ESA for now. 10. Metabolic bone disease:CorrCa ok, Phoselevated. Resume home binders (Renvela). 11. T1DM: Insulin per primary    Anice Paganini, PA-C 06/21/2020, 9:53 AM  [facility] Pager: [phone]  I have seen and examined this patient and agree with plan and assessment in the above note with renal recommendations/intervention highlighted.  Plan to get back on schedule tomorrow and now off of respiratory isolation tomorrow.  Broadus John A Jelisa Coldspring,MD 06/21/2020 2:22 PM

## 2020-06-22 DIAGNOSIS — N186 End stage renal disease: Secondary | ICD-10-CM | POA: Diagnosis not present

## 2020-06-22 DIAGNOSIS — R7881 Bacteremia: Secondary | ICD-10-CM | POA: Diagnosis not present

## 2020-06-22 DIAGNOSIS — U071 COVID-19: Secondary | ICD-10-CM | POA: Diagnosis not present

## 2020-06-22 DIAGNOSIS — I5031 Acute diastolic (congestive) heart failure: Secondary | ICD-10-CM | POA: Diagnosis not present

## 2020-06-22 LAB — GLUCOSE, CAPILLARY
Glucose-Capillary: 246 mg/dL — ABNORMAL HIGH (ref 70–99)
Glucose-Capillary: 246 mg/dL — ABNORMAL HIGH (ref 70–99)
Glucose-Capillary: 265 mg/dL — ABNORMAL HIGH (ref 70–99)
Glucose-Capillary: 249 mg/dL — ABNORMAL HIGH (ref 70–99)

## 2020-06-22 MED ORDER — SEVELAMER CARBONATE 800 MG PO TABS
800.0000 mg | ORAL_TABLET | Freq: Three times a day (TID) | ORAL | Status: DC
  Administered 2020-06-22 – 2020-07-20 (×77): 800 mg via ORAL
  Filled 2020-06-22 (×80): qty 1

## 2020-06-22 MED ORDER — PREDNISONE 5 MG PO TABS
5.0000 mg | ORAL_TABLET | Freq: Every day | ORAL | Status: DC
  Administered 2020-06-26 – 2020-07-20 (×24): 5 mg via ORAL
  Filled 2020-06-22 (×25): qty 1

## 2020-06-22 MED ORDER — VANCOMYCIN HCL IN DEXTROSE 750-5 MG/150ML-% IV SOLN
INTRAVENOUS | Status: AC
  Administered 2020-06-22: 750 mg via INTRAVENOUS
  Filled 2020-06-22: qty 150

## 2020-06-22 NOTE — Progress Notes (Signed)
PROGRESS NOTE    Paul [name]  VOH:[id number] DOB: Jul 15, 1955 DOA: 06/11/2020 PCP: [name], MD   Brief Narrative: Paul [name] is a 65 y.o. male with a history of paroxysmal A. fib, PAD, ESRD with failed renal transplant on HD TTS, DM2, diastolic CHF. Patient presented secondary to dyspnea in setting of missed dialysis and found to be COVID-19 positive. He also was found to have an enterococcus bacteremia and pneumonia from COVID-19 infection requiring treatment. While admitted, an Transthoracic Echocardiogram was significant for severely reduced EF heart failure.   Assessment & Plan:  Principal Problem:   Bacteremia due to Enterococcus Active Problems:   ESRD on hemodialysis (HCC)   DM (diabetes mellitus) (HCC)   GERD (gastroesophageal reflux disease)   PAF (paroxysmal atrial fibrillation) (HCC)   COVID-19 virus infection   Acute heart failure with preserved ejection fraction (HFpEF) ([place])   Acute respiratory failure with hypoxia Secondary to COVID-19 pneumonia in addition to fluid overload. Resolved.  Enterococcus faecalis bacteremia Likely urine source; no urine culture obtained. Transthoracic Echocardiogram obtained and was significant for an EF of 25-30% with no evidence of valvular vegetations. ID recommended a total of 2 weeks of IV antibiotics. Repeat blood cultures (1/25) no growth to date. Last day of antibiotics on 06/26/2020. -Continue Vancomycin IV  Sepsis Present on admission. Secondary to above. Management of infection as mentioned above.  COVID-19 pneumonia Patient treated with Remdesivir IV for a total of 5 days. He was also treated with Solu-medrol which was transitioned to prednisone.   Acute metabolic encephalopathy Resolved. Possibly secondary to infection.  ESRD on HD Nephrology consulted.  Paroxysmal atrial fibrillation with RVR Tachybradycardia syndrome RVR resolved. -Continue metoprolol and Eliquis  Acute systolic heart  failure Chronic diastolic heart failure EF of 25% on most recent Transthoracic Echocardiogram. Cardiology consulted and recommended hydralazine, Imdur. Beta-blocker held secondary to bradycardia and Entresto held secondary to renal disease. Recommend repeat Transthoracic Echocardiogram in 2-3 months for possible need for ischemic workup.  Diabetes mellitus, type 2 -Continue SSI  Primary hypertension -Continue metoprolol, Imdur  GERD -Continue Protonix  Hyperlipidemia -Continue Lipitor  Depression -Continue Zoloft  BPH -Continue Flomax  Dysphagia Evaluated by speech therapy. Recommendation for regular consistency diet.  Pressure injury Sacrum, POA   DVT prophylaxis: Eliquis Code Status:   Code Status: Full Code Family Communication: None at bedside Disposition Plan: Discharge to SNF when bed is available. Medically stable for discharge.   Consultants:   Nephrology  Cardilogy  Infectious disease  Procedures:   TRANSTHORACIC ECHOCARDIOGRAM (06/13/2020) IMPRESSIONS    1. Limited study to assess LV function; not all views obtained.  2. Left ventricular ejection fraction, by estimation, is 25 to 30%. The  left ventricle has severely decreased function. The left ventricle  demonstrates global hypokinesis. Left ventricular diastolic parameters are  consistent with Grade II diastolic  dysfunction (pseudonormalization).  3. Right ventricular systolic function is mildly reduced. The right  ventricular size is normal. There is normal pulmonary artery systolic  pressure.  4. Left atrial size was moderately dilated.  5. Right atrial size was mildly dilated.  6. Moderate pleural effusion in the left lateral region.  7. The mitral valve is normal in structure. Mild mitral valve  regurgitation. No evidence of mitral stenosis.  8. The aortic valve is tricuspid. Aortic valve regurgitation is mild.  Mild to moderate aortic valve sclerosis/calcification is present,  without  any evidence of aortic stenosis.  9. The inferior vena cava is normal in size  with greater than 50%  respiratory variability, suggesting right atrial pressure of 3 mmHg.  Antimicrobials:  Vancomycin   Remdesivir  Subjective: No concerns.  Objective: Vitals:   06/21/20 1900 06/21/20 2137 06/22/20 0426 06/22/20 0856  BP: (!) 142/66 140/60 (!) 125/58 (!) 166/77  Pulse: 100 95 (!) 50 (!) 52  Resp: 18 18 16 17   Temp: 98.4 F (36.9 C) 98.4 F (36.9 C) 97.7 F (36.5 C) 98.2 F (36.8 C)  TempSrc: Oral Oral Oral   SpO2: 95% 98% 96% 94%  Weight:      Height:        Intake/Output Summary (Last 24 hours) at 06/22/2020 1321 Last data filed at 06/22/2020 1243 Gross per 24 hour  Intake 900 ml  Output 0 ml  Net 900 ml   Filed Weights   06/16/20 1038 06/19/20 1425 06/19/20 1700  Weight: 62.1 kg 66.8 kg 64.9 kg    Examination:  General exam: Appears calm and comfortable Respiratory system: Clear to auscultation. Respiratory effort normal. Cardiovascular system: S1 & S2 heard, RRR. No murmurs, rubs, gallops or clicks. Gastrointestinal system: Abdomen is nondistended, soft and nontender. No organomegaly or masses felt. Normal bowel sounds heard. Central nervous system: Alert and oriented. No focal neurological deficits. Musculoskeletal: BLE BKA Skin: No cyanosis. No rashes Psychiatry: Judgement and insight appear normal. Mood & affect appropriate.      Data Reviewed: I have personally reviewed following labs and imaging studies  CBC Lab Results  Component Value Date   WBC 10.1 06/21/2020   RBC 4.77 06/21/2020   HGB 12.8 (L) 06/21/2020   HCT 41.5 06/21/2020   MCV 87.0 06/21/2020   MCH 26.8 06/21/2020   PLT 139 (L) 06/21/2020   MCHC 30.8 06/21/2020   RDW 20.5 (H) 06/21/2020   LYMPHSABS 0.6 (L) 06/16/2020   MONOABS 0.5 06/16/2020   EOSABS 0.0 06/16/2020   BASOSABS 0.0 94/85/4627     Last metabolic panel Lab Results  Component Value Date   NA 136  06/21/2020   K 3.3 (L) 06/21/2020   CL 103 06/21/2020   CO2 15 (L) 06/21/2020   BUN 84 (H) 06/21/2020   CREATININE 5.62 (H) 06/21/2020   GLUCOSE 193 (H) 06/21/2020   GFRNONAA 10 (L) 06/21/2020   GFRAA 20 (L) 01/27/2020   CALCIUM 7.1 (L) 06/21/2020   PHOS 7.0 (H) 06/21/2020   PROT 4.3 (L) 06/16/2020   ALBUMIN 1.8 (L) 06/21/2020   BILITOT 0.8 06/16/2020   ALKPHOS 70 06/16/2020   AST 25 06/16/2020   ALT 20 06/16/2020   ANIONGAP 18 (H) 06/21/2020    CBG (last 3)  Recent Labs    06/21/20 2221 06/22/20 0654 06/22/20 1128  GLUCAP 238* 246* 246*     GFR: Estimated Creatinine Clearance: 11.9 mL/min (A) (by C-G formula based on SCr of 5.62 mg/dL (H)).  Coagulation Profile: No results for input(s): INR, PROTIME in the last 168 hours.  Recent Results (from the past 240 hour(s))  Culture, blood (routine x 2)     Status: None   Collection Time: 06/13/20  8:03 AM   Specimen: BLOOD LEFT HAND  Result Value Ref Range Status   Specimen Description BLOOD LEFT HAND  Final   Special Requests   Final    BOTTLES DRAWN AEROBIC AND ANAEROBIC Blood Culture adequate volume   Culture   Final    NO GROWTH 5 DAYS Performed at [facility], [address] [place], [place] [address]    Report Status 06/18/2020  FINAL  Final  Culture, blood (routine x 2)     Status: None   Collection Time: 06/13/20  8:03 AM   Specimen: BLOOD LEFT HAND  Result Value Ref Range Status   Specimen Description BLOOD LEFT HAND  Final   Special Requests   Final    BOTTLES DRAWN AEROBIC AND ANAEROBIC Blood Culture adequate volume   Culture   Final    NO GROWTH 5 DAYS Performed at [facility], [address], [place], [place] [address]    Report Status 06/18/2020 FINAL  Final        Radiology Studies: No results found.      Scheduled Meds: . apixaban  5 mg Oral BID  . aspirin  81 mg Oral Daily  . atorvastatin  10 mg Oral Q M,W,F  . Chlorhexidine Gluconate Cloth  6 each Topical Q0600  .  hydrALAZINE  10 mg Oral TID  . insulin aspart  0-15 Units Subcutaneous TID WC  . insulin aspart  0-5 Units Subcutaneous QHS  . isosorbide mononitrate  15 mg Oral Daily  . lipase/protease/amylase  12,000 Units Oral TID AC  . magnesium chloride  2 tablet Oral BID  . metoprolol succinate  25 mg Oral Daily  . pantoprazole  80 mg Oral Daily  . predniSONE  20 mg Oral Q breakfast   Followed by  . [START ON 06/24/2020] predniSONE  10 mg Oral Q breakfast  . sertraline  50 mg Oral QHS  . sodium chloride flush  3 mL Intravenous Q12H  . tacrolimus  2 mg Oral BID  . tamsulosin  0.4 mg Oral QPC supper   Continuous Infusions: . sodium chloride    . vancomycin       LOS: 11 days     Cordelia Poche, MD [facility] 06/22/2020, 1:21 PM  If 7PM-7AM, please contact night-coverage [email or web address]

## 2020-06-22 NOTE — Progress Notes (Signed)
Occupational Therapy Treatment Patient Details Name: Paul [name] MRN: [id number] DOB: [birth year] Today's Date: 06/22/2020    History of present illness This 65yo male was referred to PT on 04/27/2019 by [name], DO with weakness & gait disorder.  He had PT following bil. TTAs & TIA and successfully became ambulatory at community level. His kidney transplant is failing and is back on transplant list. He was hospitalized 4 times in 2020 with medical issues. His current prostheses delivered late 2020.   HTN, DM2, renal transplant, CKD st IV, calciphylaxis, A-Fib, bil. TTAs, TIA   OT comments  Pt seen in conjunction with PT to maximize pts activity tolerance and optimize pts participation. Pt with good improvements in functional endurance and overall activity tolerance as pt able to walk ~ 50 ft with RW and MIN A. Pt able complete seated UB ADLs from recliner with supervision and LB dressing tasks from EOB with minguard, Pt able to don brief from bed level by rolling R<>L with supervision. Dc plan currently remains appropriate, although pending progression pt wanting to return home with assist from wife and [facility]. Will continue to follow acutely per POC and update DC recs as needed.   Follow Up Recommendations  SNF;Supervision/Assistance - 24 hour    Equipment Recommendations  None recommended by OT;Other (comment) (well equipped)    Recommendations for Other Services      Precautions / Restrictions Precautions Precautions: Fall;Other (comment) Precaution Comments: B BKA with prosthetics in room now Restrictions Weight Bearing Restrictions: No       Mobility Bed Mobility Overal bed mobility: Needs Assistance Bed Mobility: Rolling;Supine to Sit Rolling: Supervision   Supine to sit: Mod assist;+2 for physical assistance     Paul bed mobility comments: able to roll well with S and intermittnet use of rails for donning adult diaper, still with need for heavy physical assist but only  needed ModAx2 today from flat bed  Transfers Overall transfer level: Needs assistance Equipment used: Rolling walker (2 wheeled) Transfers: Sit to/from Stand Sit to Stand: Min assist;+2 physical assistance;From elevated surface         Paul transfer comment: MinAx2 to boost up to full upright standing- good hand placement for sit to stand but still needs cues for stand to sit sequencing    Balance Overall balance assessment: Needs assistance Sitting-balance support: No upper extremity supported;Feet unsupported Sitting balance-Leahy Scale: Fair Sitting balance - Comments: min guard for dynamic balance, still mild posterior lean Postural control: Posterior lean Standing balance support: Bilateral upper extremity supported;During functional activity Standing balance-Leahy Scale: Fair Standing balance comment: reliant on external support and BUE support                           ADL either performed or assessed with clinical judgement   ADL Overall ADL's : Needs assistance/impaired     Grooming: Wash/dry face;Sitting;Oral care;Supervision/safety;Set up Grooming Details (indicate cue type and reason): shaving face and completing oral care from recliner in front of sink             Lower Body Dressing: Min guard;Set up;Sitting/lateral leans Lower Body Dressing Details (indicate cue type and reason): able to don prosthetics from EOB with set- up assist and light min guard for balance from EOB, pt also able to underwear from supine with set- up from rolling R<>L to pull up to waist line Toilet Transfer: Minimal assistance;+2 for safety/equipment;RW;Ambulation Toilet Transfer Details (indicate cue type and  reason): simulated via functional mobility         Functional mobility during ADLs: Minimal assistance;+2 for safety/equipment;Rolling walker Paul ADL Comments: pt making good progress this session able to complete functional mobility greater than a household  distance with RW and MINA +2 for safety. pt completed sitting UB ADLs from sink with set- up assist showing improvements in pts activity tolerance, endurance, and overall functional independence     Vision       Perception     Praxis      Cognition Arousal/Alertness: Awake/alert Behavior During Therapy: WFL for tasks assessed/performed;Flat affect Overall Cognitive Status: Impaired/Different from baseline Area of Impairment: Safety/judgement;Problem solving;Awareness                         Safety/Judgement: Decreased awareness of deficits Awareness: Emergent Problem Solving: Slow processing;Difficulty sequencing;Requires verbal cues Paul Comments: pt very flat during session but following commands and appropriate        Exercises     Shoulder Instructions       Paul Comments VSS on RA    Pertinent Vitals/ Pain       Pain Assessment: Faces Faces Pain Scale: No hurt Pain Intervention(s): Limited activity within patient's tolerance;Monitored during session  Home Living                                          Prior Functioning/Environment              Frequency  Min 2X/week        Progress Toward Goals  OT Goals(current goals can now be found in the care plan section)  Progress towards OT goals: Progressing toward goals  Acute Rehab OT Goals Patient Stated Goal: be able to go home from hospital OT Goal Formulation: With patient Time For Goal Achievement: 06/26/20 Potential to Achieve Goals: Good  Plan Discharge plan remains appropriate;Frequency remains appropriate    Co-evaluation      Reason for Co-Treatment: Complexity of the patient's impairments (multi-system involvement);For patient/therapist safety;To address functional/ADL transfers          AM-PAC OT "6 Clicks" Daily Activity     Outcome Measure   Help from another person eating meals?: None Help from another person taking care of personal  grooming?: A Little Help from another person toileting, which includes using toliet, bedpan, or urinal?: A Lot Help from another person bathing (including washing, rinsing, drying)?: A Lot Help from another person to put on and taking off regular upper body clothing?: A Little Help from another person to put on and taking off regular lower body clothing?: A Little 6 Click Score: 17    End of Session Equipment Utilized During Treatment: Gait belt;Rolling walker  OT Visit Diagnosis: Other abnormalities of gait and mobility (R26.89);Muscle weakness (generalized) (M62.81)   Activity Tolerance Patient tolerated treatment well   Patient Left in chair;with call bell/phone within reach;with chair alarm set   Nurse Communication Mobility status        Time: 1010-1050 OT Time Calculation (min): 40 min  Charges: OT Paul Charges $OT Visit: 1 Visit OT Treatments $Self Care/Home Management : 38-52 mins  Harley Alto., COTA/L Acute Rehabilitation Services [phone]    Paul [name] 06/22/2020, 11:33 AM

## 2020-06-22 NOTE — Progress Notes (Addendum)
[facility] Progress Note   Subjective:   Seen in room, now off COVID isolation precautions and asks if he can have a visitor. Feeling better. No SOB, CP, dizziness, palpitations, abdominal pain or nausea.   Objective Vitals:   06/21/20 1900 06/21/20 2137 06/22/20 0426 06/22/20 0856  BP: (!) 142/66 140/60 (!) 125/58 (!) 166/77  Pulse: 100 95 (!) 50 (!) 52  Resp: 18 18 16 17   Temp: 98.4 F (36.9 C) 98.4 F (36.9 C) 97.7 F (36.5 C) 98.2 F (36.8 C)  TempSrc: Oral Oral Oral   SpO2: 95% 98% 96% 94%  Weight:      Height:       Physical Exam General: Well developed, ill appearing male in NAD Heart: RRR, no murmurs, rubs or gallops Lungs: CTA bilaterally without wheezing, rhonchi or rales Abdomen: Soft, non-distended, +BS Extremities: No edema b/l lower extremities. R BKA Dialysis Access: RUE AVF + bruit  Additional Objective Labs: Basic Metabolic Panel: Recent Labs  Lab 06/19/20 0811 06/20/20 2220 06/21/20 0252  NA 137 137 136  K 3.6 3.4* 3.3*  CL 102 104 103  CO2 18* 19* 15*  GLUCOSE 223* 192* 193*  BUN 104* 80* 84*  CREATININE 6.36* 5.55* 5.62*  CALCIUM 7.3* 6.9* 7.1*  PHOS 7.7* 6.6* 7.0*   Liver Function Tests: Recent Labs  Lab 06/16/20 0351 06/19/20 0811 06/20/20 2220 06/21/20 0252  AST 25  --   --   --   ALT 20  --   --   --   ALKPHOS 70  --   --   --   BILITOT 0.8  --   --   --   PROT 4.3*  --   --   --   ALBUMIN 1.9* 2.0* 1.7* 1.8*   No results for input(s): LIPASE, AMYLASE in the last 168 hours. CBC: Recent Labs  Lab 06/16/20 0706 06/17/20 0745 06/19/20 0805 06/21/20 0252  WBC 7.7 10.6* 13.6* 10.1  NEUTROABS 6.5  --   --   --   HGB 12.6* 12.6* 13.6 12.8*  HCT 41.3 41.5 44.8 41.5  MCV 87.5 85.7 86.2 87.0  PLT 109* 117* 169 139*   Blood Culture    Component Value Date/Time   SDES BLOOD LEFT HAND 06/13/2020 0803   SDES BLOOD LEFT HAND 06/13/2020 0803   SPECREQUEST  06/13/2020 0803    BOTTLES DRAWN AEROBIC AND ANAEROBIC  Blood Culture adequate volume   SPECREQUEST  06/13/2020 0803    BOTTLES DRAWN AEROBIC AND ANAEROBIC Blood Culture adequate volume   CULT  06/13/2020 0803    NO GROWTH 5 DAYS Performed at [facility], [place] [address] [place], [place] [address]    CULT  06/13/2020 0803    NO GROWTH 5 DAYS Performed at [facility] [address] [place], [place] [address]    REPTSTATUS 06/18/2020 FINAL 06/13/2020 0803   REPTSTATUS 06/18/2020 FINAL 06/13/2020 0803   CBG: Recent Labs  Lab 06/21/20 0630 06/21/20 1305 06/21/20 1807 06/21/20 2221 06/22/20 0654  GLUCAP 180* 213* 173* 238* 246*   Medications: . sodium chloride    . vancomycin     . apixaban  5 mg Oral BID  . aspirin  81 mg Oral Daily  . atorvastatin  10 mg Oral Q M,W,F  . Chlorhexidine Gluconate Cloth  6 each Topical Q0600  . hydrALAZINE  10 mg Oral TID  . insulin aspart  0-15 Units Subcutaneous TID WC  . insulin aspart  0-5 Units Subcutaneous QHS  . isosorbide mononitrate  15 mg Oral Daily  . lipase/protease/amylase  12,000 Units Oral TID AC  . magnesium chloride  2 tablet Oral BID  . metoprolol succinate  25 mg Oral Daily  . pantoprazole  80 mg Oral Daily  . predniSONE  20 mg Oral Q breakfast   Followed by  . [START ON 06/24/2020] predniSONE  10 mg Oral Q breakfast  . sertraline  50 mg Oral QHS  . sodium chloride flush  3 mL Intravenous Q12H  . tacrolimus  2 mg Oral BID  . tamsulosin  0.4 mg Oral QPC supper    Dialysis Orders: TTS NW 3.5h 66kh 3K/2.25 bath RFA AVF Hep none - hect 2 ug - mircera 200 q2 , last 1/13  CXR 1/23 - IMPRESSION: Diffuse interstitial prominence with streaky interstitial opacities within the mid to lower lung fields and small left pleural effusion. Findings may reflect multifocal atypical/viral infection versus pulmonary edema.  Assessment/Plan: 1. COVID-19 pneumonia: per admitting team.First positive test was 06/12/19. Now off isolation precautions and symptoms  improving.  2. Enterococcus bacteremia: ID recommended 2 weeks of IV abx, last dose 2/7. Can continue vancomycin with outpatient HD if patient is discharged before 2/7. 3. Tachybradycardia syndrome/A-fib RVR:On metoprolol and eliquis.  4. A/C combined CHF: LVEF 25-30% here, G2DD. On aspirin, lipitor, hydralazine, imdur an Toprol with mild bradycardia.  5. Hypoxemia: on RA now,admission + IS opacities by CXR.Reports SOB improved. 6. ESRD:Usual TTS schedule. Last HD was postponed to Monday. No hyperkalemia, BUN stable, remains on RA. Planned for HD today. Dialysis treatments are shortened here due to high inpatient census and staffing shortage. 7. Hypokalemia: K+ 3.3. Recommend no supplement for now, will use 4K bath with HD tomorrow.   8. Hypertension/volume:BPwell controlled.Well under EDW. Will need lower EDW at Dc.  9. Anemia:Hgb > 12, no ESA for now. 10. Metabolic bone disease:CorrCa ok, Phoselevated. Resume home binders (Renvela). 11. T1DM: Insulin per primary   Anice Paganini, PA-C 06/22/2020, 9:54 AM  [facility] Pager: [phone]  I have seen and examined this patient and agree with plan and assessment in the above note with renal recommendations/intervention highlighted.  To get back on schedule today. Governor Rooks Bevin Mayall,MD 06/22/2020 12:38 PM

## 2020-06-22 NOTE — Progress Notes (Signed)
Patient off the floor hemodialysis

## 2020-06-22 NOTE — Progress Notes (Signed)
Inpatient Diabetes Program Recommendations  AACE/ADA: New Consensus Statement on Inpatient Glycemic Control (2015)  Target Ranges:  Prepandial:   less than 140 mg/dL      Peak postprandial:   less than 180 mg/dL (1-2 hours)      Critically ill patients:  140 - 180 mg/dL   Lab Results  Component Value Date   GLUCAP 246 (H) 06/22/2020   HGBA1C 7.5 (H) 06/12/2020    Review of Glycemic ControlResults for Paul [name], NETTLE (MRN [id number]) as of 06/22/2020 11:05  Ref. Range 06/21/2020 06:30 06/21/2020 13:05 06/21/2020 18:07 06/21/2020 22:21 06/22/2020 06:54  Glucose-Capillary Latest Ref Range: 70 - 99 mg/dL 180 (H) 213 (H) 173 (H) 238 (H) 246 (H)   Diabetes history:DM 2 Outpatient Diabetes medications: Basaglar 8 units daily, Humalog 2-3 units tid with meals Current orders for Inpatient glycemic control: Novolog moderate tid with meals and HS  Inpatient Diabetes Program Recommendations:    Note that Lantus stopped due to low blood sugar on 06/20/20.  Consider resuming Lantus 8 units daily and reduce Novolog correction to sensitive tid with meals and HS scale.  Also consider adding Novolog 2 units tid with meals to cover CHO intake (hold if patient eats less than 50% or NPO).   Thanks,  Adah Perl, RN, BC-ADM Inpatient Diabetes Coordinator Pager [phone] (8a-5p)

## 2020-06-22 NOTE — Progress Notes (Signed)
Physical Therapy Treatment Patient Details Name: Paul [name] MRN: [id number] DOB: 1956-05-20 Today's Date: 06/22/2020    History of Present Illness This 65yo male was referred to PT on 04/27/2019 by [name], DO with weakness & gait disorder.  He had PT following bil. TTAs & TIA and successfully became ambulatory at community level. His kidney transplant is failing and is back on transplant list. He was hospitalized 4 times in 2020 with medical issues. His current prostheses delivered late 2020.   HTN, DM2, renal transplant, CKD st IV, calciphylaxis, A-Fib, bil. TTAs, TIA    PT Comments    Patient received in bed, pleasant and cooperative with therapies. Still needs heavy physical assist for bed mobility, however demonstrated better balance donning prosthetics today and needed less physical assist to get up to standing although still needed +2 assist. Able to progress gait distance significantly with RW/chair follow but limited by fatigue; gait pattern much improved today as well. Left up in recliner with all needs met, OT present and attending. Progressing well.     Follow Up Recommendations  SNF;Supervision/Assistance - 24 hour     Equipment Recommendations  Wheelchair cushion (measurements PT);Wheelchair (measurements PT);3in1 (PT);Rolling walker with 5" wheels    Recommendations for Other Services       Precautions / Restrictions Precautions Precautions: Fall;Other (comment) Precaution Comments: B BKA with prosthetics in room now Restrictions Weight Bearing Restrictions: No    Mobility  Bed Mobility Overal bed mobility: Needs Assistance Bed Mobility: Rolling;Supine to Sit Rolling: Supervision   Supine to sit: Mod assist;+2 for physical assistance     General bed mobility comments: able to roll well with S and intermittnet use of rails for donning adult diaper, still with need for heavy physical assist but only needed ModAx2 today from flat bed  Transfers Overall  transfer level: Needs assistance Equipment used: Rolling walker (2 wheeled) Transfers: Sit to/from Stand Sit to Stand: Min assist;+2 physical assistance;From elevated surface         General transfer comment: MinAx2 to boost up to full upright standing- good hand placement for sit to stand but still needs cues for stand to sit sequencing  Ambulation/Gait Ambulation/Gait assistance: Min guard;+2 safety/equipment Gait Distance (Feet): 50 Feet Assistive device: Rolling walker (2 wheeled) Gait Pattern/deviations: Step-through pattern;Decreased step length - right;Decreased step length - left;Decreased stance time - right;Decreased stance time - left;Decreased stride length;Trunk flexed Gait velocity: decreased   General Gait Details: slow but steady with RW once up, functional activity tolerance slowly improving. Able to progress to step through pattern today   Stairs             Wheelchair Mobility    Modified Rankin (Stroke Patients Only)       Balance Overall balance assessment: Needs assistance Sitting-balance support: No upper extremity supported;Feet unsupported Sitting balance-Leahy Scale: Fair Sitting balance - Comments: min guard for dynamic balance, still mild posterior lean Postural control: Posterior lean Standing balance support: Bilateral upper extremity supported;During functional activity Standing balance-Leahy Scale: Fair Standing balance comment: reliant on external support and BUE support                            Cognition Arousal/Alertness: Awake/alert Behavior During Therapy: WFL for tasks assessed/performed;Flat affect Overall Cognitive Status: Impaired/Different from baseline Area of Impairment: Safety/judgement;Problem solving  Safety/Judgement: Decreased awareness of deficits Awareness: Emergent Problem Solving: Slow processing;Difficulty sequencing;Requires verbal cues General Comments:  functional but occasional difficulty with STM, still with increased processing time      Exercises      General Comments General comments (skin integrity, edema, etc.): VSS on RA      Pertinent Vitals/Pain Pain Assessment: Faces Faces Pain Scale: No hurt Pain Intervention(s): Limited activity within patient's tolerance;Monitored during session    Home Living                      Prior Function            PT Goals (current goals can now be found in the care plan section) Acute Rehab PT Goals Patient Stated Goal: be able to go home from hospital PT Goal Formulation: With patient Time For Goal Achievement: 06/27/20 Potential to Achieve Goals: Fair Progress towards PT goals: Progressing toward goals    Frequency    Min 2X/week      PT Plan Current plan remains appropriate    Co-evaluation              AM-PAC PT "6 Clicks" Mobility   Outcome Measure  Help needed turning from your back to your side while in a flat bed without using bedrails?: A Little Help needed moving from lying on your back to sitting on the side of a flat bed without using bedrails?: A Lot Help needed moving to and from a bed to a chair (including a wheelchair)?: A Little Help needed standing up from a chair using your arms (e.g., wheelchair or bedside chair)?: A Lot Help needed to walk in hospital room?: A Little Help needed climbing 3-5 steps with a railing? : A Lot 6 Click Score: 15    End of Session Equipment Utilized During Treatment: Gait belt Activity Tolerance: Patient tolerated treatment well Patient left: in chair;with call bell/phone within reach;Other (comment) (OT present and attending) Nurse Communication: Mobility status PT Visit Diagnosis: Other abnormalities of gait and mobility (R26.89);Muscle weakness (generalized) (M62.81)     Time: 1010-1040 PT Time Calculation (min) (ACUTE ONLY): 30 min  Charges:  $Gait Training: 8-22 mins (co-tx with OT)                      [name], DPT, PN1   Supplemental Physical Therapist [place]    Pager [phone] Acute Rehab Office [phone]

## 2020-06-23 DIAGNOSIS — R7881 Bacteremia: Secondary | ICD-10-CM | POA: Diagnosis not present

## 2020-06-23 DIAGNOSIS — U071 COVID-19: Secondary | ICD-10-CM | POA: Diagnosis not present

## 2020-06-23 DIAGNOSIS — I5031 Acute diastolic (congestive) heart failure: Secondary | ICD-10-CM | POA: Diagnosis not present

## 2020-06-23 DIAGNOSIS — N186 End stage renal disease: Secondary | ICD-10-CM | POA: Diagnosis not present

## 2020-06-23 LAB — RENAL FUNCTION PANEL
Albumin: 2 g/dL — ABNORMAL LOW (ref 3.5–5.0)
Anion gap: 13 (ref 5–15)
BUN: 68 mg/dL — ABNORMAL HIGH (ref 8–23)
CO2: 19 mmol/L — ABNORMAL LOW (ref 22–32)
Calcium: 7.1 mg/dL — ABNORMAL LOW (ref 8.9–10.3)
Chloride: 104 mmol/L (ref 98–111)
Creatinine, Ser: 5 mg/dL — ABNORMAL HIGH (ref 0.61–1.24)
GFR, Estimated: 12 mL/min — ABNORMAL LOW (ref >60)
Glucose, Bld: 246 mg/dL — ABNORMAL HIGH (ref 70–99)
Phosphorus: 4.9 mg/dL — ABNORMAL HIGH (ref 2.5–4.6)
Potassium: 3.3 mmol/L — ABNORMAL LOW (ref 3.5–5.1)
Sodium: 136 mmol/L (ref 135–145)

## 2020-06-23 LAB — GLUCOSE, CAPILLARY
Glucose-Capillary: 204 mg/dL — ABNORMAL HIGH (ref 70–99)
Glucose-Capillary: 208 mg/dL — ABNORMAL HIGH (ref 70–99)
Glucose-Capillary: 212 mg/dL — ABNORMAL HIGH (ref 70–99)
Glucose-Capillary: 217 mg/dL — ABNORMAL HIGH (ref 70–99)

## 2020-06-23 MED ORDER — CHLORHEXIDINE GLUCONATE CLOTH 2 % EX PADS
6.0000 | MEDICATED_PAD | Freq: Every day | CUTANEOUS | Status: DC
  Administered 2020-06-25 – 2020-06-26 (×2): 6 via TOPICAL

## 2020-06-23 NOTE — Progress Notes (Addendum)
Occupational Therapy Treatment Patient Details Name: Paul [name] MRN: [id number] DOB: 24-Jan-1956 Today's Date: 06/23/2020    History of present illness 65 yo male presenting to ED via EMS after missing dialysis and weakness with COVID-19 +. Off COVID precautions on 2/3. PMH including HTN, DM2, renal transplant, CKD st IV, calciphylaxis, A-Fib, bil. TTAs, bilateral BKA with prosthesis,and TIA.   OT comments  Pt limited this session by fatigue, feeling light headed, and soft BP. Pt requiring Max A for squat pivot back to EOB. Pt able to doff prosthetics with Min Guard A. Pt with bowel incontinence and requiring Total A for peri care at bed level. Continue to recommend dc to SNF and will continue to follow acutely as admitted.    BP: sitting in recliner 99/63, elevating BLE and reclining 98/60, after BUE exercises 93/56, after transfer 103/57   Follow Up Recommendations  SNF;Supervision/Assistance - 24 hour    Equipment Recommendations  None recommended by OT;Other (comment) (well equipped)    Recommendations for Other Services      Precautions / Restrictions Precautions Precautions: Fall;Other (comment) Precaution Comments: B BKA with prosthetics in room now Restrictions Weight Bearing Restrictions: No       Mobility Bed Mobility Overal bed mobility: Needs Assistance Bed Mobility: Rolling;Sit to Supine Rolling: Supervision   Supine to sit: +2 for physical assistance;Max assist;HOB elevated Sit to supine: Min guard   General bed mobility comments: Pt performing bed mobiltiy without phsyical A  Transfers Overall transfer level: Needs assistance Equipment used: Rolling walker (2 wheeled) Transfers: Sit to/from [employer] Sit to Stand: Mod assist Stand pivot transfers: Min assist;+2 safety/equipment;From elevated surface Squat pivot transfers: Max assist     General transfer comment: Max A to squat pivot to EOB. Pt limited by fatigue, light headedness,  and soft BP    Balance Overall balance assessment: Needs assistance Sitting-balance support: No upper extremity supported;Feet unsupported Sitting balance-Leahy Scale: Fair Sitting balance - Comments: min guard for dynamic balance, still mild posterior lean Postural control: Posterior lean Standing balance support: Bilateral upper extremity supported;During functional activity Standing balance-Leahy Scale: Poor Standing balance comment: Max A for pivot                           ADL either performed or assessed with clinical judgement   ADL Overall ADL's : Needs assistance/impaired                     Lower Body Dressing: Min guard;Sitting/lateral leans Lower Body Dressing Details (indicate cue type and reason): MIn Guard A for pt to doff prothesis. Toilet Transfer: Maximal Public house manager Details (indicate cue type and reason): Max A to squat pivot to EOB Toileting- Clothing Manipulation and Hygiene: Total assistance;Bed level Toileting - Clothing Manipulation Details (indicate cue type and reason): Bowel incontinence noted on entry. Total A for cleanup of BM at bed level. Pt rolling with supervision and requiring Total A for pero care     Functional mobility during ADLs: Maximal assistance (squat pivot) General ADL Comments: Pt limited by faitue and soft BP. Requiring Max A to pivot back to EOB. Notified RN of low BP.     Vision   Vision Assessment?: No apparent visual deficits   Perception     Praxis      Cognition Arousal/Alertness: Awake/alert Behavior During Therapy: WFL for tasks assessed/performed;Flat affect Overall Cognitive Status: Impaired/Different from baseline Area of Impairment: Safety/judgement;Problem solving;Awareness  Safety/Judgement: Decreased awareness of deficits Awareness: Emergent Problem Solving: Slow processing;Difficulty sequencing;Requires verbal cues General  Comments: Pt flat this session. Very agreeable to therapy but very fatigued. Pt reporting he feels very weak and light headed. Requiring inreased time.        Exercises     Shoulder Instructions       General Comments BP sitting in recliner 99/63, elevating BLE and reclining 98/60, after BUE exercises 93/56, after transfer 103/57    Pertinent Vitals/ Pain       Pain Assessment: Faces Faces Pain Scale: Hurts a little bit Pain Location: generalized Pain Descriptors / Indicators: Discomfort Pain Intervention(s): Monitored during session  Home Living                                          Prior Functioning/Environment              Frequency  Min 2X/week        Progress Toward Goals  OT Goals(current goals can now be found in the care plan section)  Progress towards OT goals: Not progressing toward goals - comment (Limited by BP)  Acute Rehab OT Goals Patient Stated Goal: be able to go home from hospital OT Goal Formulation: With patient Time For Goal Achievement: 06/26/20 Potential to Achieve Goals: Good ADL Goals Pt Will Perform Grooming: with modified independence;sitting Pt Will Perform Lower Body Bathing: with supervision;sitting/lateral leans Pt Will Perform Lower Body Dressing: with supervision;sitting/lateral leans Pt Will Transfer to Toilet: with min assist;bedside commode;ambulating Pt/caregiver will Perform Home Exercise Program: Increased strength;Both right and left upper extremity;With theraband;Independently;With written HEP provided Additional ADL Goal #1: Pt to demonstrate ability to monitor O2 and independently implement pursed lip breathing to maintain O2 > 88% Additional ADL Goal #2: Pt to demonstrate implementation of at least 2 energy conservation strategies during ADLs  Plan Discharge plan remains appropriate;Frequency remains appropriate    Co-evaluation                 AM-PAC OT "6 Clicks" Daily Activity      Outcome Measure   Help from another person eating meals?: None Help from another person taking care of personal grooming?: A Little Help from another person toileting, which includes using toliet, bedpan, or urinal?: A Lot Help from another person bathing (including washing, rinsing, drying)?: A Lot Help from another person to put on and taking off regular upper body clothing?: A Little Help from another person to put on and taking off regular lower body clothing?: A Little 6 Click Score: 17    End of Session Equipment Utilized During Treatment: Gait belt  OT Visit Diagnosis: Other abnormalities of gait and mobility (R26.89);Muscle weakness (generalized) (M62.81)   Activity Tolerance Patient limited by fatigue;Other (comment) (Soft BP and light headed)   Patient Left with call bell/phone within reach;in bed;with nursing/sitter in room   Nurse Communication Mobility status        Time: 1316-1340 OT Time Calculation (min): 24 min  Charges: OT General Charges $OT Visit: 1 Visit OT Treatments $Self Care/Home Management : 23-37 mins  [place], OTR/L Acute Rehab Pager: [phone] Office: [place] 06/23/2020, 2:02 PM

## 2020-06-23 NOTE — Progress Notes (Signed)
Patient can return to his regular seat at his outpatient HD shift as of Tuesday 06/27/20 at [facility]. He will be asked to wear an N95 mask, which will be provided to him for another 7 days on treatment. Navigator notes recommendation for SNF and is available for assistance with HD questions/concerns as needed.   Alphonzo Cruise, [place] [facility] Navigator [phone]

## 2020-06-23 NOTE — TOC Progression Note (Signed)
Transition of Care [facility]) - Progression Note    Patient Details  Name: Paul [name] MRN: [id number] Date of Birth: 1955-09-07  [facility] CM/SW Contact  Sharlet [place] Mila Homer, LCSW Phone Number: 06/23/2020, 4:28 PM  Clinical Narrative: Patient in need of ST rehab and facility search initiated. However patient still does not have any bed offers. CSW will follow up with some facilities that did not respond next week to determine if there is any beds available.       Expected Discharge Plan: Skilled Nursing Facility Barriers to Discharge: Continued Medical Work up  Expected Discharge Plan and Services Expected Discharge Plan: [place] In-house Referral: NA Discharge Planning Services: CM Consult Post Acute Care Choice: [place] Living arrangements for the past 2 months: Single Family Home                 DME Arranged: N/A DME Agency: NA       HH Arranged: NA HH Agency: NA         Social Determinants of Health (SDOH) Interventions    Readmission Risk Interventions Readmission Risk Prevention Plan 01/18/2020 04/26/2019 11/04/2018  Transportation Screening Complete Complete Complete  Medication Review Press photographer) Complete Complete Complete  PCP or Specialist appointment within 3-5 days of discharge Complete Not Complete Complete  PCP/Specialist Appt Not Complete comments - pending medical stability -  [place] or Home Care Consult Complete Complete Complete  SW Recovery Care/Counseling Consult Complete Complete Complete  Palliative Care Screening Not Applicable Not Complete Not Applicable  Comments - may be appropriate, will ask MD -  [place] Not Applicable Patient Refused Not Applicable  Some recent data might be hidden

## 2020-06-23 NOTE — Progress Notes (Addendum)
[facility] Progress Note   Subjective:   Patient seen and examined at bedside.  No specific complaints. Denies CP, SOB, n/v/d and abdominal pain.  Objective Vitals:   06/22/20 2155 06/23/20 0457 06/23/20 0500 06/23/20 0913  BP: 131/67 133/64  136/63  Pulse: 64 (!) 53  (!) 55  Resp: 16 17  17   Temp: 98.4 F (36.9 C) 97.8 F (36.6 C)  98 F (36.7 C)  TempSrc:  Oral    SpO2: 94% 96%  97%  Weight:   63.5 kg   Height:       Physical Exam General:WD, chronically ill appearing male in NAD Heart:RRR, no mrg Lungs:CTAB, nml WOB on RA Abdomen:soft, NTND Extremities:no LE edema, b/l BKA Dialysis Access: RU AVF +b   Filed Weights   06/22/20 1450 06/22/20 1755 06/23/20 0500  Weight: 65.6 kg 63.5 kg 63.5 kg    Intake/Output Summary (Last 24 hours) at 06/23/2020 1253 Last data filed at 06/23/2020 0817 Gross per 24 hour  Intake 520 ml  Output 1500 ml  Net -980 ml    Additional Objective Labs: Basic Metabolic Panel: Recent Labs  Lab 06/19/20 0811 06/20/20 2220 06/21/20 0252  NA 137 137 136  K 3.6 3.4* 3.3*  CL 102 104 103  CO2 18* 19* 15*  GLUCOSE 223* 192* 193*  BUN 104* 80* 84*  CREATININE 6.36* 5.55* 5.62*  CALCIUM 7.3* 6.9* 7.1*  PHOS 7.7* 6.6* 7.0*   Liver Function Tests: Recent Labs  Lab 06/19/20 0811 06/20/20 2220 06/21/20 0252  ALBUMIN 2.0* 1.7* 1.8*   CBC: Recent Labs  Lab 06/17/20 0745 06/19/20 0805 06/21/20 0252  WBC 10.6* 13.6* 10.1  HGB 12.6* 13.6 12.8*  HCT 41.5 44.8 41.5  MCV 85.7 86.2 87.0  PLT 117* 169 139*   CBG: Recent Labs  Lab 06/22/20 1128 06/22/20 1843 06/22/20 2155 06/23/20 0647 06/23/20 1141  GLUCAP 246* 265* 249* 204* 208*    Medications: . sodium chloride    . vancomycin Stopped (06/22/20 1745)   . apixaban  5 mg Oral BID  . aspirin  81 mg Oral Daily  . atorvastatin  10 mg Oral Q M,W,F  . Chlorhexidine Gluconate Cloth  6 each Topical Q0600  . hydrALAZINE  10 mg Oral TID  . insulin aspart  0-15  Units Subcutaneous TID WC  . insulin aspart  0-5 Units Subcutaneous QHS  . isosorbide mononitrate  15 mg Oral Daily  . lipase/protease/amylase  12,000 Units Oral TID AC  . magnesium chloride  2 tablet Oral BID  . metoprolol succinate  25 mg Oral Daily  . pantoprazole  80 mg Oral Daily  . [START ON 06/24/2020] predniSONE  10 mg Oral Q breakfast  . [START ON 06/26/2020] predniSONE  5 mg Oral Q breakfast  . sertraline  50 mg Oral QHS  . sevelamer carbonate  800 mg Oral TID WC  . sodium chloride flush  3 mL Intravenous Q12H  . tacrolimus  2 mg Oral BID  . tamsulosin  0.4 mg Oral QPC supper    Dialysis Orders: TTS NW 3.5h 66kh 3K/2.25 bath RFA AVF Hep none - hect 2 ug - mircera 200 q2 , last 1/13  CXR 1/23 - IMPRESSION: Diffuse interstitial prominence with streaky interstitial opacities within the mid to lower lung fields and small left pleural effusion. Findings may reflect multifocal atypical/viral infection versus pulmonary edema.  Assessment/Plan: 1. COVID-19 pneumonia:per admitting team.First positive test was 06/12/19. Now off isolation precautions and symptoms improving. s/p  remdesivir and solumedrol. ON prednisone currently.  2. Enterococcus bacteremia: ID recommended 2 weeks of IV abx, last dose 2/7. Echo negative for vegetations. Can continue vancomycin with outpatient HD if patient is discharged before 2/7. 3. Tachybradycardia syndrome/A-fib RVR:On metoprolol and eliquis.  4. A/C combined CHF: LVEF 25-30% here, G2DD.On aspirin, lipitor, hydralazine, imdur and Toprol with mild bradycardia. 5. Hypoxemia: nml WOB on RA now, admission + IS opacities by CXR.Reports SOB improved. 6. ESRD:Usual TTS schedule. Off schedule earlier this week but resumed regular schedule yesterday.  Next HD tomorrow. Dialysis treatments are shortened here due to high inpatient census and staffing shortage. 7. Hypokalemia: last K+ 3.3. Recommend no supplement for now, used increase K bath  yesterday.  Ordered repeat labs for today.  8. Hypertension/volume:BPwell controlled.Well under EDW. Will need lower EDW at Dc.  9. Anemia:Hgb > 12, no ESA for now. 10. Metabolic bone disease:CorrCa ok, Phoselevated. Resume home binders (Renvela). 11. T1DM: Insulin per primary   Jen Mow, PA-C [facility] 06/23/2020,12:53 PM  LOS: 12 days   I have seen and examined this patient and agree with plan and assessment in the above note with renal recommendations/intervention highlighted.  Awaiting SNF placement. [place] John A Makenzy Krist,MD 06/23/2020 1:23 PM

## 2020-06-23 NOTE — Progress Notes (Signed)
PROGRESS NOTE    Paul [name]  NGE:[id number] DOB: January 26, 1956 DOA: 06/11/2020 PCP: [name], MD   Brief Narrative: Paul [name] is a 65 y.o. male with a history of paroxysmal A. fib, PAD, ESRD with failed renal transplant on HD TTS, DM2, diastolic CHF. Patient presented secondary to dyspnea in setting of missed dialysis and found to be COVID-19 positive. He also was found to have an enterococcus bacteremia and pneumonia from COVID-19 infection requiring treatment. While admitted, an Transthoracic Echocardiogram was significant for severely reduced EF heart failure.   Assessment & Plan:  Principal Problem:   Bacteremia due to Enterococcus Active Problems:   ESRD on hemodialysis (HCC)   DM (diabetes mellitus) (HCC)   GERD (gastroesophageal reflux disease)   PAF (paroxysmal atrial fibrillation) (HCC)   COVID-19 virus infection   Acute heart failure with preserved ejection fraction (HFpEF) ([place])   Acute respiratory failure with hypoxia Secondary to COVID-19 pneumonia in addition to fluid overload. Resolved.  Transient enterococcus faecalis bacteremia Likely urine source; no urine culture obtained. Transthoracic Echocardiogram obtained and was significant for an EF of 25-30% with no evidence of valvular vegetations. ID recommended a total of 2 weeks of IV antibiotics. Repeat blood cultures (1/25) no growth to date. Last day of antibiotics on 06/26/2020. -Continue Vancomycin IV  Sepsis Present on admission. Secondary to above. Management of infection as mentioned above.  COVID-19 pneumonia Patient treated with Remdesivir IV for a total of 5 days. He was also treated with Solu-medrol which was transitioned to prednisone.   Acute metabolic encephalopathy Resolved. Possibly secondary to infection.  ESRD on HD Nephrology consulted.  Paroxysmal atrial fibrillation with RVR Tachybradycardia syndrome RVR resolved. -Continue metoprolol and Eliquis  Acute systolic heart  failure Chronic diastolic heart failure EF of 25% on most recent Transthoracic Echocardiogram. Cardiology consulted and recommended hydralazine, Imdur. Beta-blocker held secondary to bradycardia and Entresto held secondary to renal disease. Recommend repeat Transthoracic Echocardiogram in 2-3 months for possible need for ischemic workup.  Diabetes mellitus, type 2 -Continue SSI  Primary hypertension -Continue metoprolol, Imdur  GERD -Continue Protonix  Hyperlipidemia -Continue Lipitor  Depression -Continue Zoloft  BPH -Continue Flomax  Dysphagia Evaluated by speech therapy. Recommendation for regular consistency diet.  Pressure injury Sacrum, POA   DVT prophylaxis: Eliquis Code Status:   Code Status: Full Code Family Communication: None at bedside Disposition Plan: Discharge to SNF when bed is available. Medically stable for discharge.   Consultants:   Nephrology  Cardilogy  Infectious disease  Procedures:   TRANSTHORACIC ECHOCARDIOGRAM (06/13/2020) IMPRESSIONS    1. Limited study to assess LV function; not all views obtained.  2. Left ventricular ejection fraction, by estimation, is 25 to 30%. The  left ventricle has severely decreased function. The left ventricle  demonstrates global hypokinesis. Left ventricular diastolic parameters are  consistent with Grade II diastolic  dysfunction (pseudonormalization).  3. Right ventricular systolic function is mildly reduced. The right  ventricular size is normal. There is normal pulmonary artery systolic  pressure.  4. Left atrial size was moderately dilated.  5. Right atrial size was mildly dilated.  6. Moderate pleural effusion in the left lateral region.  7. The mitral valve is normal in structure. Mild mitral valve  regurgitation. No evidence of mitral stenosis.  8. The aortic valve is tricuspid. Aortic valve regurgitation is mild.  Mild to moderate aortic valve sclerosis/calcification is present,  without  any evidence of aortic stenosis.  9. The inferior vena cava is normal in  size with greater than 50%  respiratory variability, suggesting right atrial pressure of 3 mmHg.  Antimicrobials:  Vancomycin   Remdesivir  Subjective: No issues overnight.  Objective: Vitals:   06/22/20 1841 06/22/20 2155 06/23/20 0457 06/23/20 0500  BP: 112/65 131/67 133/64   Pulse: 62 64 (!) 53   Resp: 18 16 17    Temp: 98.1 F (36.7 C) 98.4 F (36.9 C) 97.8 F (36.6 C)   TempSrc:   Oral   SpO2: 100% 94% 96%   Weight:    63.5 kg  Height:        Intake/Output Summary (Last 24 hours) at 06/23/2020 0810 Last data filed at 06/23/2020 0601 Gross per 24 hour  Intake 760 ml  Output 1500 ml  Net -740 ml   Filed Weights   06/22/20 1450 06/22/20 1755 06/23/20 0500  Weight: 65.6 kg 63.5 kg 63.5 kg    Examination:  General exam: Appears calm and comfortable Respiratory system: Clear to auscultation. Respiratory effort normal. Cardiovascular system: S1 & S2 heard, RRR. 1/6 systolic murmur Gastrointestinal system: Abdomen is nondistended, soft and nontender. No organomegaly or masses felt. Normal bowel sounds heard. Central nervous system: Alert and oriented. No focal neurological deficits. Musculoskeletal: No edema. BL BKA Skin: No cyanosis. No rashes Psychiatry: Judgement and insight appear normal. Mood & affect appropriate.     Data Reviewed: I have personally reviewed following labs and imaging studies  CBC Lab Results  Component Value Date   WBC 10.1 06/21/2020   RBC 4.77 06/21/2020   HGB 12.8 (L) 06/21/2020   HCT 41.5 06/21/2020   MCV 87.0 06/21/2020   MCH 26.8 06/21/2020   PLT 139 (L) 06/21/2020   MCHC 30.8 06/21/2020   RDW 20.5 (H) 06/21/2020   LYMPHSABS 0.6 (L) 06/16/2020   MONOABS 0.5 06/16/2020   EOSABS 0.0 06/16/2020   BASOSABS 0.0 03/05/5101     Last metabolic panel Lab Results  Component Value Date   NA 136 06/21/2020   K 3.3 (L) 06/21/2020   CL 103  06/21/2020   CO2 15 (L) 06/21/2020   BUN 84 (H) 06/21/2020   CREATININE 5.62 (H) 06/21/2020   GLUCOSE 193 (H) 06/21/2020   GFRNONAA 10 (L) 06/21/2020   GFRAA 20 (L) 01/27/2020   CALCIUM 7.1 (L) 06/21/2020   PHOS 7.0 (H) 06/21/2020   PROT 4.3 (L) 06/16/2020   ALBUMIN 1.8 (L) 06/21/2020   BILITOT 0.8 06/16/2020   ALKPHOS 70 06/16/2020   AST 25 06/16/2020   ALT 20 06/16/2020   ANIONGAP 18 (H) 06/21/2020    CBG (last 3)  Recent Labs    06/22/20 1843 06/22/20 2155 06/23/20 0647  GLUCAP 265* 249* 204*     GFR: Estimated Creatinine Clearance: 11.6 mL/min (A) (by C-G formula based on SCr of 5.62 mg/dL (H)).  Coagulation Profile: No results for input(s): INR, PROTIME in the last 168 hours.  No results found for this or any previous visit (from the past 240 hour(s)).      Radiology Studies: No results found.      Scheduled Meds: . apixaban  5 mg Oral BID  . aspirin  81 mg Oral Daily  . atorvastatin  10 mg Oral Q M,W,F  . Chlorhexidine Gluconate Cloth  6 each Topical Q0600  . hydrALAZINE  10 mg Oral TID  . insulin aspart  0-15 Units Subcutaneous TID WC  . insulin aspart  0-5 Units Subcutaneous QHS  . isosorbide mononitrate  15 mg Oral Daily  . lipase/protease/amylase  12,000 Units Oral TID AC  . magnesium chloride  2 tablet Oral BID  . metoprolol succinate  25 mg Oral Daily  . pantoprazole  80 mg Oral Daily  . predniSONE  20 mg Oral Q breakfast   Followed by  . [START ON 06/24/2020] predniSONE  10 mg Oral Q breakfast  . [START ON 06/26/2020] predniSONE  5 mg Oral Q breakfast  . sertraline  50 mg Oral QHS  . sevelamer carbonate  800 mg Oral TID WC  . sodium chloride flush  3 mL Intravenous Q12H  . tacrolimus  2 mg Oral BID  . tamsulosin  0.4 mg Oral QPC supper   Continuous Infusions: . sodium chloride    . vancomycin Stopped (06/22/20 1745)     LOS: 12 days     Cordelia Poche, MD [facility] 06/23/2020, 8:10 AM  If 7PM-7AM, please contact  night-coverage [email or web address]

## 2020-06-23 NOTE — Progress Notes (Signed)
Physical Therapy Treatment Patient Details Name: Maximillian M Kohen MRN: [id number] DOB: 11/20/1955 Today's Date: 06/23/2020    History of Present Illness This 65yo male was referred to PT on 04/27/2019 by Jessica Vann, DO with weakness & gait disorder.  He had PT following bil. TTAs & TIA and successfully became ambulatory at community level. His kidney transplant is failing and is back on transplant list. He was hospitalized 4 times in 2020 with medical issues. His current prostheses delivered late 2020.   HTN, DM2, renal transplant, CKD st IV, calciphylaxis, A-Fib, bil. TTAs, TIA    PT Comments    Patient received in bed, pleasant but fatigued today. TotalA for pericare following incontinent BM, but able to roll side to side with S and rails to don adult diaper on a mod(I) basis. Still needs heavy physical assistance for supine to sit even with head of bed elevated and had significant LOB requiring up to ModA when donning prosthetics today. Too fatigued to progress gait but we were able to pivot over to the recliner with RW. Left up in chair with all needs met, chair alarm active. Had hoped that we would be able to progress to HHPT, however unfortunately at this point I really think he must go to rehab prior to be able to successfully return home, and he is agreeable to this POC.   Follow Up Recommendations  SNF;Supervision/Assistance - 24 hour     Equipment Recommendations  Wheelchair cushion (measurements PT);Wheelchair (measurements PT);3in1 (PT);Rolling walker with 5" wheels    Recommendations for Other Services       Precautions / Restrictions Precautions Precautions: Fall;Other (comment) Precaution Comments: B BKA with prosthetics in room now Restrictions Weight Bearing Restrictions: No    Mobility  Bed Mobility Overal bed mobility: Needs Assistance Bed Mobility: Rolling;Supine to Sit Rolling: Supervision   Supine to sit: +2 for physical assistance;Max assist;HOB elevated      General bed mobility comments: able to roll well with S and intermittnet use of rails for donning adult diaper, needed heavy physical assist up to MaxAx2 even with HOB raised  Transfers Overall transfer level: Needs assistance Equipment used: Rolling walker (2 wheeled) Transfers: Sit to/from Stand Sit to Stand: Mod assist;+2 physical assistance;From elevated surface Stand pivot transfers: Min assist;+2 safety/equipment;From elevated surface       General transfer comment: needed multiple attempts and bed progressively raised as well as much more physical assist today- very fatigued  Ambulation/Gait             General Gait Details: unable to tolerate- fatigue   Stairs             Wheelchair Mobility    Modified Rankin (Stroke Patients Only)       Balance Overall balance assessment: Needs assistance Sitting-balance support: No upper extremity supported;Feet unsupported Sitting balance-Leahy Scale: Fair Sitting balance - Comments: min guard for dynamic balance, still mild posterior lean Postural control: Posterior lean Standing balance support: Bilateral upper extremity supported;During functional activity Standing balance-Leahy Scale: Poor Standing balance comment: Min-ModA to maintain balance while donning prosthetics today                            Cognition Arousal/Alertness: Awake/alert Behavior During Therapy: WFL for tasks assessed/performed;Flat affect Overall Cognitive Status: Impaired/Different from baseline Area of Impairment: Safety/judgement;Problem solving;Awareness                           Safety/Judgement: Decreased awareness of deficits Awareness: Emergent Problem Solving: Slow processing;Difficulty sequencing;Requires verbal cues General Comments: pt very flat during session but following commands and appropriate      Exercises      General Comments        Pertinent Vitals/Pain Pain Assessment: Faces Faces  Pain Scale: No hurt Pain Location: generalized Pain Descriptors / Indicators: Discomfort Pain Intervention(s): Limited activity within patient's tolerance;Monitored during session    Home Living                      Prior Function            PT Goals (current goals can now be found in the care plan section) Acute Rehab PT Goals Patient Stated Goal: be able to go home from hospital PT Goal Formulation: With patient Time For Goal Achievement: 06/27/20 Potential to Achieve Goals: Fair Progress towards PT goals: Not progressing toward goals - comment (limited by fatigue)    Frequency    Min 2X/week      PT Plan Current plan remains appropriate    Co-evaluation              AM-PAC PT "6 Clicks" Mobility   Outcome Measure  Help needed turning from your back to your side while in a flat bed without using bedrails?: A Little Help needed moving from lying on your back to sitting on the side of a flat bed without using bedrails?: A Lot Help needed moving to and from a bed to a chair (including a wheelchair)?: A Lot Help needed standing up from a chair using your arms (e.g., wheelchair or bedside chair)?: A Lot Help needed to walk in hospital room?: A Little Help needed climbing 3-5 steps with a railing? : Total 6 Click Score: 13    End of Session Equipment Utilized During Treatment: Gait belt Activity Tolerance: Patient limited by fatigue Patient left: in chair;with call bell/phone within reach;with chair alarm set Nurse Communication: Mobility status;Need for lift equipment PT Visit Diagnosis: Other abnormalities of gait and mobility (R26.89);Muscle weakness (generalized) (M62.81)     Time: 8889-1694 PT Time Calculation (min) (ACUTE ONLY): 29 min  Charges:  $Therapeutic Activity: 23-37 mins                     [name], DPT, PN1   Supplemental Physical Therapist [place]    Pager [phone] Acute Rehab Office [phone]

## 2020-06-24 ENCOUNTER — Encounter (HOSPITAL_COMMUNITY): Payer: Self-pay | Admitting: Internal Medicine

## 2020-06-24 ENCOUNTER — Other Ambulatory Visit: Payer: Self-pay

## 2020-06-24 DIAGNOSIS — B952 Enterococcus as the cause of diseases classified elsewhere: Secondary | ICD-10-CM | POA: Diagnosis not present

## 2020-06-24 DIAGNOSIS — R7881 Bacteremia: Secondary | ICD-10-CM | POA: Diagnosis not present

## 2020-06-24 LAB — GLUCOSE, CAPILLARY
Glucose-Capillary: 185 mg/dL — ABNORMAL HIGH (ref 70–99)
Glucose-Capillary: 231 mg/dL — ABNORMAL HIGH (ref 70–99)
Glucose-Capillary: 251 mg/dL — ABNORMAL HIGH (ref 70–99)
Glucose-Capillary: 314 mg/dL — ABNORMAL HIGH (ref 70–99)

## 2020-06-24 LAB — CBC
HCT: 37.4 % — ABNORMAL LOW (ref 39.0–52.0)
Hemoglobin: 11.9 g/dL — ABNORMAL LOW (ref 13.0–17.0)
MCH: 27.9 pg (ref 26.0–34.0)
MCHC: 31.8 g/dL (ref 30.0–36.0)
MCV: 87.8 fL (ref 80.0–100.0)
Platelets: 111 10*3/uL — ABNORMAL LOW (ref 150–400)
RBC: 4.26 MIL/uL (ref 4.22–5.81)
RDW: 20.8 % — ABNORMAL HIGH (ref 11.5–15.5)
WBC: 12 10*3/uL — ABNORMAL HIGH (ref 4.0–10.5)
nRBC: 0 % (ref 0.0–0.2)

## 2020-06-24 LAB — RENAL FUNCTION PANEL
Albumin: 1.9 g/dL — ABNORMAL LOW (ref 3.5–5.0)
Anion gap: 18 — ABNORMAL HIGH (ref 5–15)
BUN: 84 mg/dL — ABNORMAL HIGH (ref 8–23)
CO2: 15 mmol/L — ABNORMAL LOW (ref 22–32)
Calcium: 7.1 mg/dL — ABNORMAL LOW (ref 8.9–10.3)
Chloride: 103 mmol/L (ref 98–111)
Creatinine, Ser: 5.49 mg/dL — ABNORMAL HIGH (ref 0.61–1.24)
GFR, Estimated: 11 mL/min — ABNORMAL LOW (ref >60)
Glucose, Bld: 253 mg/dL — ABNORMAL HIGH (ref 70–99)
Phosphorus: 5.5 mg/dL — ABNORMAL HIGH (ref 2.5–4.6)
Potassium: 3.1 mmol/L — ABNORMAL LOW (ref 3.5–5.1)
Sodium: 136 mmol/L (ref 135–145)

## 2020-06-24 MED ORDER — SODIUM CHLORIDE 0.9 % IV SOLN: 100.0000 mL | INTRAVENOUS | Status: DC | PRN

## 2020-06-24 MED ORDER — PENTAFLUOROPROP-TETRAFLUOROETH EX AERO: 1.0000 "application " | INHALATION_SPRAY | CUTANEOUS | Status: DC | PRN

## 2020-06-24 MED ORDER — HEPARIN SODIUM (PORCINE) 1000 UNIT/ML DIALYSIS: 1000.0000 [IU] | INTRAMUSCULAR | Status: DC | PRN

## 2020-06-24 MED ORDER — VANCOMYCIN HCL IN DEXTROSE 750-5 MG/150ML-% IV SOLN
INTRAVENOUS | Status: AC
  Filled 2020-06-24: qty 150

## 2020-06-24 MED ORDER — LIDOCAINE-PRILOCAINE 2.5-2.5 % EX CREA: 1.0000 "application " | TOPICAL_CREAM | CUTANEOUS | Status: DC | PRN

## 2020-06-24 MED ORDER — LIDOCAINE HCL (PF) 1 % IJ SOLN: 5.0000 mL | INTRAMUSCULAR | Status: DC | PRN

## 2020-06-24 MED ORDER — ALTEPLASE 2 MG IJ SOLR: 2.0000 mg | Freq: Once | INTRAMUSCULAR | Status: DC | PRN

## 2020-06-24 NOTE — Progress Notes (Signed)
PROGRESS NOTE    Paul [name]  GGE:[id number] DOB: 08-Nov-1955 DOA: 06/11/2020 PCP: [name], MD   Brief Narrative: Paul [name] is a 65 y.o. male with a history of paroxysmal A. fib, PAD, ESRD with failed renal transplant on HD TTS, DM2, diastolic CHF. Patient presented secondary to dyspnea in setting of missed dialysis and found to be COVID-19 positive. He also was found to have an enterococcus bacteremia and pneumonia from COVID-19 infection requiring treatment. While admitted, an Transthoracic Echocardiogram was significant for severely reduced EF heart failure.   Assessment & Plan:  Principal Problem:   Bacteremia due to Enterococcus Active Problems:   ESRD on hemodialysis (HCC)   DM (diabetes mellitus) (HCC)   GERD (gastroesophageal reflux disease)   PAF (paroxysmal atrial fibrillation) (HCC)   COVID-19 virus infection   Acute heart failure with preserved ejection fraction (HFpEF) ([place])   Acute respiratory failure with hypoxia Secondary to COVID-19 pneumonia in addition to fluid overload. Resolved.  Transient enterococcus faecalis bacteremia Likely urine source; no urine culture obtained. Transthoracic Echocardiogram obtained and was significant for an EF of 25-30% with no evidence of valvular vegetations. ID recommended a total of 2 weeks of IV antibiotics. Repeat blood cultures (1/25) no growth to date. Last day of antibiotics on 06/26/2020. -Continue Vancomycin IV  Sepsis Present on admission. Secondary to above. Management of infection as mentioned above.  COVID-19 pneumonia Patient treated with Remdesivir IV for a total of 5 days. He was also treated with Solu-medrol which was transitioned to prednisone.   Acute metabolic encephalopathy Resolved. Possibly secondary to infection.  ESRD on HD Nephrology consulted.  Paroxysmal atrial fibrillation with RVR Tachybradycardia syndrome RVR resolved. -Continue metoprolol and Eliquis  Acute systolic heart  failure Chronic diastolic heart failure EF of 25% on most recent Transthoracic Echocardiogram. Cardiology consulted and recommended hydralazine, Imdur. Beta-blocker held secondary to bradycardia and Entresto held secondary to renal disease. Recommend repeat Transthoracic Echocardiogram in 2-3 months for possible need for ischemic workup.  Diabetes mellitus, type 2 -Continue SSI  Primary hypertension -Continue metoprolol, Imdur  GERD -Continue Protonix  Hyperlipidemia -Continue Lipitor  Depression -Continue Zoloft  BPH -Continue Flomax  Dysphagia Evaluated by speech therapy. Recommendation for regular consistency diet.  Pressure injury Sacrum, POA   DVT prophylaxis: Eliquis Code Status:   Code Status: Full Code Family Communication: None at bedside Disposition Plan: Discharge to SNF when bed is available. Medically stable for discharge.   Consultants:   Nephrology  Cardilogy  Infectious disease  Procedures:   TRANSTHORACIC ECHOCARDIOGRAM (06/13/2020) IMPRESSIONS    1. Limited study to assess LV function; not all views obtained.  2. Left ventricular ejection fraction, by estimation, is 25 to 30%. The  left ventricle has severely decreased function. The left ventricle  demonstrates global hypokinesis. Left ventricular diastolic parameters are  consistent with Grade II diastolic  dysfunction (pseudonormalization).  3. Right ventricular systolic function is mildly reduced. The right  ventricular size is normal. There is normal pulmonary artery systolic  pressure.  4. Left atrial size was moderately dilated.  5. Right atrial size was mildly dilated.  6. Moderate pleural effusion in the left lateral region.  7. The mitral valve is normal in structure. Mild mitral valve  regurgitation. No evidence of mitral stenosis.  8. The aortic valve is tricuspid. Aortic valve regurgitation is mild.  Mild to moderate aortic valve sclerosis/calcification is present,  without  any evidence of aortic stenosis.  9. The inferior vena cava is normal in  size with greater than 50%  respiratory variability, suggesting right atrial pressure of 3 mmHg.  Antimicrobials:  Vancomycin   Remdesivir  Subjective: No concerns.  Objective: Vitals:   06/23/20 1651 06/23/20 2057 06/24/20 0521 06/24/20 0930  BP: 129/67 133/67 138/72 121/65  Pulse: (!) 55 (!) 53 (!) 53 (!) 54  Resp: 17   16  Temp: 98.2 F (36.8 C) 98 F (36.7 C) 97.7 F (36.5 C) 97.6 F (36.4 C)  TempSrc:  Oral Oral Oral  SpO2: 96% 97% 96% 96%  Weight:      Height:        Intake/Output Summary (Last 24 hours) at 06/24/2020 0938 Last data filed at 06/24/2020 0601 Gross per 24 hour  Intake 655 ml  Output --  Net 655 ml   Filed Weights   06/22/20 1450 06/22/20 1755 06/23/20 0500  Weight: 65.6 kg 63.5 kg 63.5 kg    Examination:  General: Well appearing, no distress   Data Reviewed: I have personally reviewed following labs and imaging studies  CBC Lab Results  Component Value Date   WBC 10.1 06/21/2020   RBC 4.77 06/21/2020   HGB 12.8 (L) 06/21/2020   HCT 41.5 06/21/2020   MCV 87.0 06/21/2020   MCH 26.8 06/21/2020   PLT 139 (L) 06/21/2020   MCHC 30.8 06/21/2020   RDW 20.5 (H) 06/21/2020   LYMPHSABS 0.6 (L) 06/16/2020   MONOABS 0.5 06/16/2020   EOSABS 0.0 06/16/2020   BASOSABS 0.0 35/57/3220     Last metabolic panel Lab Results  Component Value Date   NA 136 06/23/2020   K 3.3 (L) 06/23/2020   CL 104 06/23/2020   CO2 19 (L) 06/23/2020   BUN 68 (H) 06/23/2020   CREATININE 5.00 (H) 06/23/2020   GLUCOSE 246 (H) 06/23/2020   GFRNONAA 12 (L) 06/23/2020   GFRAA 20 (L) 01/27/2020   CALCIUM 7.1 (L) 06/23/2020   PHOS 4.9 (H) 06/23/2020   PROT 4.3 (L) 06/16/2020   ALBUMIN 2.0 (L) 06/23/2020   BILITOT 0.8 06/16/2020   ALKPHOS 70 06/16/2020   AST 25 06/16/2020   ALT 20 06/16/2020   ANIONGAP 13 06/23/2020    CBG (last 3)  Recent Labs    06/23/20 1651  06/23/20 2100 06/24/20 0656  GLUCAP 212* 217* 185*     GFR: Estimated Creatinine Clearance: 13.1 mL/min (A) (by C-G formula based on SCr of 5 mg/dL (H)).  Coagulation Profile: No results for input(s): INR, PROTIME in the last 168 hours.  No results found for this or any previous visit (from the past 240 hour(s)).      Radiology Studies: No results found.      Scheduled Meds: . apixaban  5 mg Oral BID  . aspirin  81 mg Oral Daily  . atorvastatin  10 mg Oral Q M,W,F  . Chlorhexidine Gluconate Cloth  6 each Topical Q0600  . Chlorhexidine Gluconate Cloth  6 each Topical Q0600  . hydrALAZINE  10 mg Oral TID  . insulin aspart  0-15 Units Subcutaneous TID WC  . insulin aspart  0-5 Units Subcutaneous QHS  . isosorbide mononitrate  15 mg Oral Daily  . lipase/protease/amylase  12,000 Units Oral TID AC  . magnesium chloride  2 tablet Oral BID  . metoprolol succinate  25 mg Oral Daily  . pantoprazole  80 mg Oral Daily  . predniSONE  10 mg Oral Q breakfast  . [START ON 06/26/2020] predniSONE  5 mg Oral Q breakfast  . sertraline  50 mg Oral QHS  . sevelamer carbonate  800 mg Oral TID WC  . sodium chloride flush  3 mL Intravenous Q12H  . tacrolimus  2 mg Oral BID  . tamsulosin  0.4 mg Oral QPC supper   Continuous Infusions: . sodium chloride    . vancomycin Stopped (06/22/20 1745)     LOS: 13 days     Cordelia Poche, MD [facility] 06/24/2020, 9:38 AM  If 7PM-7AM, please contact night-coverage [email or web address]

## 2020-06-24 NOTE — Progress Notes (Signed)
Pharmacy Antibiotic Note  Paul [name] is a 65 y.o. male admitted on 06/11/2020 with bacteremia.   Continues on Vancomycin for 2 week course, through 06/26/20.  ESRD - usual TTS.  Repeat blood cultures 1/25 negative.   Plan: Vancomycin 750 mg IV after HD TTS Next dose today, Last dose on 2/7     Height: 6' (182.9 cm) Weight: 63.5 kg (139 lb 15.9 oz) IBW/kg (Calculated) : 77.6  Temp (24hrs), Avg:98 F (36.7 C), Min:97.7 F (36.5 C), Max:98.2 F (36.8 C)  Recent Labs  Lab 06/17/20 0745 06/19/20 0805 06/19/20 0811 06/20/20 2220 06/21/20 0252 06/23/20 1432  WBC 10.6* 13.6*  --   --  10.1  --   CREATININE 4.97*  --  6.36* 5.55* 5.62* 5.00*     Allergies  Allergen Reactions  . Metformin And Related Other (See Comments)    Has kidney problems   Antimicrobials this admission: Vancomycin 1/24 >>  Microbiology results: 1/23 Blood: Enterococcus faecalis & Staph epi 1/25 blood: negative   Paul [name] A. Levada Dy, PharmD, BCPS, FNKF Clinical Pharmacist [place] Please utilize Amion for appropriate phone number to reach the unit pharmacist ([place])   06/24/2020 7:43 AM

## 2020-06-24 NOTE — Procedures (Signed)
I was present at this dialysis session. I have reviewed the session itself and made appropriate changes.   Vital signs in last 24 hours:  Temp:  [97.6 F (36.4 C)-98.2 F (36.8 C)] 97.6 F (36.4 C) (02/05 0930) Pulse Rate:  [53-55] 54 (02/05 0930) Resp:  [16-17] 16 (02/05 0930) BP: (121-138)/(65-72) 121/65 (02/05 0930) SpO2:  [96 %-97 %] 96 % (02/05 0930) Weight change:  Filed Weights   06/22/20 1450 06/22/20 1755 06/23/20 0500  Weight: 65.6 kg 63.5 kg 63.5 kg    Recent Labs  Lab 06/23/20 1432  NA 136  K 3.3*  CL 104  CO2 19*  GLUCOSE 246*  BUN 68*  CREATININE 5.00*  CALCIUM 7.1*  PHOS 4.9*    Recent Labs  Lab 06/19/20 0805 06/21/20 0252  WBC 13.6* 10.1  HGB 13.6 12.8*  HCT 44.8 41.5  MCV 86.2 87.0  PLT 169 139*    Scheduled Meds: . apixaban  5 mg Oral BID  . aspirin  81 mg Oral Daily  . atorvastatin  10 mg Oral Q M,W,F  . Chlorhexidine Gluconate Cloth  6 each Topical Q0600  . Chlorhexidine Gluconate Cloth  6 each Topical Q0600  . hydrALAZINE  10 mg Oral TID  . insulin aspart  0-15 Units Subcutaneous TID WC  . insulin aspart  0-5 Units Subcutaneous QHS  . isosorbide mononitrate  15 mg Oral Daily  . lipase/protease/amylase  12,000 Units Oral TID AC  . magnesium chloride  2 tablet Oral BID  . metoprolol succinate  25 mg Oral Daily  . pantoprazole  80 mg Oral Daily  . predniSONE  10 mg Oral Q breakfast  . [START ON 06/26/2020] predniSONE  5 mg Oral Q breakfast  . sertraline  50 mg Oral QHS  . sevelamer carbonate  800 mg Oral TID WC  . sodium chloride flush  3 mL Intravenous Q12H  . tacrolimus  2 mg Oral BID  . tamsulosin  0.4 mg Oral QPC supper   Continuous Infusions: . sodium chloride    . vancomycin Stopped (06/22/20 1745)   PRN Meds:.sodium chloride, acetaminophen, chlorpheniramine-HYDROcodone, guaiFENesin-dextromethorphan, Ipratropium-Albuterol, polyethylene glycol, sodium chloride flush, traMADol    Dialysis Orders: TTS NW 3.5h 66kh 3K/2.25  bath RFA AVF Hep none - hect 2 ug - mircera 200 q2 , last 1/13  CXR 1/23 - IMPRESSION: Diffuse interstitial prominence with streaky interstitial opacities within the mid to lower lung fields and small left pleural effusion. Findings may reflect multifocal atypical/viral infection versus pulmonary edema.  Assessment/Plan: 1. COVID-19 pneumonia:per admitting team.First positive test was 06/12/19.Now off isolation precautions and symptoms improving.s/p remdesivir and solumedrol. ON prednisone currently.  2. Enterococcus bacteremia: ID recommended 2 weeks of IV abx, last dose 2/7. Echo negative for vegetations. Can continue vancomycin with outpatient HD if patient is discharged before 2/7. 3. Tachybradycardia syndrome/A-fib RVR:On metoprolol and eliquis.  4. A/C combined CHF: LVEF 25-30% here, G2DD.On aspirin, lipitor, hydralazine, imdur and Toprol with mild bradycardia. 5. Hypoxemia: nml WOB onRA now, admission + IS opacities by CXR.Reports SOB improved. 6. ESRD:Usual TTS schedule. Off schedule earlier this week but resumed regular schedule yesterday.  Next HD tomorrow. Dialysis treatments are shortened here due to high inpatient census and staffing shortage. 7. Hypokalemia: last K+ 3.3. Recommend no supplement for now, used increase K bath yesterday.  Ordered repeat labs for today.  8. Hypertension/volume:BPwell controlled.Well under EDW. Will need lower EDW at Dc.  9. Anemia:Hgb > 12, no ESA for now. 10. Metabolic bone  disease:CorrCa ok, Phoselevated. Resume home binders (Renvela). 11. T1DM: Insulin per primary 12. Disposition - pt can return to his outpatient HD as of 06/27/20 at [facility] but will need to wear an N95 mask for another 7 days on treatment. Awaiting SNF placement.    Paul Potts,  MD 06/24/2020, 12:06 PM

## 2020-06-25 DIAGNOSIS — R7881 Bacteremia: Secondary | ICD-10-CM | POA: Diagnosis not present

## 2020-06-25 DIAGNOSIS — B952 Enterococcus as the cause of diseases classified elsewhere: Secondary | ICD-10-CM | POA: Diagnosis not present

## 2020-06-25 LAB — GLUCOSE, CAPILLARY
Glucose-Capillary: 232 mg/dL — ABNORMAL HIGH (ref 70–99)
Glucose-Capillary: 194 mg/dL — ABNORMAL HIGH (ref 70–99)
Glucose-Capillary: 244 mg/dL — ABNORMAL HIGH (ref 70–99)
Glucose-Capillary: 260 mg/dL — ABNORMAL HIGH (ref 70–99)
Glucose-Capillary: 272 mg/dL — ABNORMAL HIGH (ref 70–99)

## 2020-06-25 NOTE — Progress Notes (Signed)
PROGRESS NOTE    Paul [name]  VOH:[id number] DOB: [birth year] DOA: 06/11/2020 PCP: [name], MD   Brief Narrative: Paul [name] is a 65 y.o. male with a history of paroxysmal A. fib, PAD, ESRD with failed renal transplant on HD TTS, DM2, diastolic CHF. Patient presented secondary to dyspnea in setting of missed dialysis and found to be COVID-19 positive. He also was found to have an enterococcus bacteremia and pneumonia from COVID-19 infection requiring treatment. While admitted, an Transthoracic Echocardiogram was significant for severely reduced EF heart failure.   Assessment & Plan:  Principal Problem:   Bacteremia due to Enterococcus Active Problems:   ESRD on hemodialysis (HCC)   DM (diabetes mellitus) (HCC)   GERD (gastroesophageal reflux disease)   PAF (paroxysmal atrial fibrillation) (HCC)   COVID-19 virus infection   Acute heart failure with preserved ejection fraction (HFpEF) ([place])   Acute respiratory failure with hypoxia Secondary to COVID-19 pneumonia in addition to fluid overload. Resolved.  Transient enterococcus faecalis bacteremia Likely urine source; no urine culture obtained. Transthoracic Echocardiogram obtained and was significant for an EF of 25-30% with no evidence of valvular vegetations. ID recommended a total of 2 weeks of IV antibiotics. Repeat blood cultures (1/25) no growth to date. Last day of antibiotics on 06/26/2020. -Continue Vancomycin IV  Sepsis Present on admission. Secondary to above. Management of infection as mentioned above.  COVID-19 pneumonia Patient treated with Remdesivir IV for a total of 5 days. He was also treated with Solu-medrol which was transitioned to prednisone.   Acute metabolic encephalopathy Resolved. Possibly secondary to infection.  ESRD on HD Nephrology consulted.  Paroxysmal atrial fibrillation with RVR Tachybradycardia syndrome RVR resolved. -Continue metoprolol and Eliquis  Acute systolic heart  failure Chronic diastolic heart failure EF of 25% on most recent Transthoracic Echocardiogram. Cardiology consulted and recommended hydralazine, Imdur. Beta-blocker held secondary to bradycardia and Entresto held secondary to renal disease. Recommend repeat Transthoracic Echocardiogram in 2-3 months for possible need for ischemic workup.  Diabetes mellitus, type 2 -Continue SSI  Primary hypertension -Continue metoprolol, Imdur  GERD -Continue Protonix  Hyperlipidemia -Continue Lipitor  Depression -Continue Zoloft  BPH -Continue Flomax  Dysphagia Evaluated by speech therapy. Recommendation for regular consistency diet.  Pressure injury Sacrum, POA   DVT prophylaxis: Eliquis Code Status:   Code Status: Full Code Family Communication: None at bedside Disposition Plan: Discharge to SNF when bed is available. Medically stable for discharge.   Consultants:   Nephrology  Cardilogy  Infectious disease  Procedures:   TRANSTHORACIC ECHOCARDIOGRAM (06/13/2020) IMPRESSIONS    1. Limited study to assess LV function; not all views obtained.  2. Left ventricular ejection fraction, by estimation, is 25 to 30%. The  left ventricle has severely decreased function. The left ventricle  demonstrates global hypokinesis. Left ventricular diastolic parameters are  consistent with Grade II diastolic  dysfunction (pseudonormalization).  3. Right ventricular systolic function is mildly reduced. The right  ventricular size is normal. There is normal pulmonary artery systolic  pressure.  4. Left atrial size was moderately dilated.  5. Right atrial size was mildly dilated.  6. Moderate pleural effusion in the left lateral region.  7. The mitral valve is normal in structure. Mild mitral valve  regurgitation. No evidence of mitral stenosis.  8. The aortic valve is tricuspid. Aortic valve regurgitation is mild.  Mild to moderate aortic valve sclerosis/calcification is present,  without  any evidence of aortic stenosis.  9. The inferior vena cava is normal in  size with greater than 50%  respiratory variability, suggesting right atrial pressure of 3 mmHg.  Antimicrobials:  Vancomycin   Remdesivir  Subjective: No issues noted overnight.  Objective: Vitals:   06/24/20 1727 06/24/20 2001 06/25/20 0529 06/25/20 0943  BP: (!) 115/58 115/69 136/75 137/63  Pulse: 60 (!) 59 (!) 54 (!) 55  Resp: 18 16 14 17   Temp: 97.9 F (36.6 C) 98.9 F (37.2 C) 98.5 F (36.9 C) 98.5 F (36.9 C)  TempSrc:  Oral Oral   SpO2: 98% 94% 96% 95%  Weight:      Height:        Intake/Output Summary (Last 24 hours) at 06/25/2020 0949 Last data filed at 06/25/2020 0800 Gross per 24 hour  Intake 700 ml  Output 2100 ml  Net -1400 ml   Filed Weights   06/23/20 0500 06/24/20 1145 06/24/20 1430  Weight: 63.5 kg 64.4 kg 62 kg    Examination:  General: Well appearing, no distress   Data Reviewed: I have personally reviewed following labs and imaging studies  CBC Lab Results  Component Value Date   WBC 12.0 (H) 06/24/2020   RBC 4.26 06/24/2020   HGB 11.9 (L) 06/24/2020   HCT 37.4 (L) 06/24/2020   MCV 87.8 06/24/2020   MCH 27.9 06/24/2020   PLT 111 (L) 06/24/2020   MCHC 31.8 06/24/2020   RDW 20.8 (H) 06/24/2020   LYMPHSABS 0.6 (L) 06/16/2020   MONOABS 0.5 06/16/2020   EOSABS 0.0 06/16/2020   BASOSABS 0.0 77/93/9030     Last metabolic panel Lab Results  Component Value Date   NA 136 06/24/2020   K 3.1 (L) 06/24/2020   CL 103 06/24/2020   CO2 15 (L) 06/24/2020   BUN 84 (H) 06/24/2020   CREATININE 5.49 (H) 06/24/2020   GLUCOSE 253 (H) 06/24/2020   GFRNONAA 11 (L) 06/24/2020   GFRAA 20 (L) 01/27/2020   CALCIUM 7.1 (L) 06/24/2020   PHOS 5.5 (H) 06/24/2020   PROT 4.3 (L) 06/16/2020   ALBUMIN 1.9 (L) 06/24/2020   BILITOT 0.8 06/16/2020   ALKPHOS 70 06/16/2020   AST 25 06/16/2020   ALT 20 06/16/2020   ANIONGAP 18 (H) 06/24/2020    CBG (last 3)  Recent  Labs    06/24/20 1721 06/24/20 2222 06/25/20 0638  GLUCAP 251* 314* 232*     GFR: Estimated Creatinine Clearance: 11.6 mL/min (A) (by C-G formula based on SCr of 5.49 mg/dL (H)).  Coagulation Profile: No results for input(s): INR, PROTIME in the last 168 hours.  No results found for this or any previous visit (from the past 240 hour(s)).      Radiology Studies: No results found.      Scheduled Meds: . apixaban  5 mg Oral BID  . aspirin  81 mg Oral Daily  . atorvastatin  10 mg Oral Q M,W,F  . Chlorhexidine Gluconate Cloth  6 each Topical Q0600  . Chlorhexidine Gluconate Cloth  6 each Topical Q0600  . hydrALAZINE  10 mg Oral TID  . insulin aspart  0-15 Units Subcutaneous TID WC  . insulin aspart  0-5 Units Subcutaneous QHS  . isosorbide mononitrate  15 mg Oral Daily  . lipase/protease/amylase  12,000 Units Oral TID AC  . magnesium chloride  2 tablet Oral BID  . metoprolol succinate  25 mg Oral Daily  . pantoprazole  80 mg Oral Daily  . [START ON 06/26/2020] predniSONE  5 mg Oral Q breakfast  . sertraline  50 mg Oral  QHS  . sevelamer carbonate  800 mg Oral TID WC  . sodium chloride flush  3 mL Intravenous Q12H  . tacrolimus  2 mg Oral BID  . tamsulosin  0.4 mg Oral QPC supper   Continuous Infusions: . sodium chloride    . vancomycin Stopped (06/24/20 1422)     LOS: 14 days     Cordelia Poche, MD [facility] 06/25/2020, 9:49 AM  If 7PM-7AM, please contact night-coverage [email or web address]

## 2020-06-25 NOTE — Progress Notes (Addendum)
[facility] Progress Note   Subjective:   Patient seen and examined at bedside.  No specific complaints.  Tolerated dialysis well yesterday. Denies CP, SOB, n/v/d and abdominal pain.  Objective Vitals:   06/24/20 1727 06/24/20 2001 06/25/20 0529 06/25/20 0943  BP: (!) 115/58 115/69 136/75 137/63  Pulse: 60 (!) 59 (!) 54 (!) 55  Resp: 18 16 14 17   Temp: 97.9 F (36.6 C) 98.9 F (37.2 C) 98.5 F (36.9 C) 98.5 F (36.9 C)  TempSrc:  Oral Oral   SpO2: 98% 94% 96% 95%  Weight:      Height:       Physical Exam General:WD, ill appearing male in NAD Heart:RRR, +4/4 systolic murmur Lungs:CTAB, nml WOB Abdomen:soft, NTND Extremities:no LE edema, b/l BKA Dialysis Access: RU AVF +b/t   Filed Weights   06/23/20 0500 06/24/20 1145 06/24/20 1430  Weight: 63.5 kg 64.4 kg 62 kg    Intake/Output Summary (Last 24 hours) at 06/25/2020 1235 Last data filed at 06/25/2020 0800 Gross per 24 hour  Intake 700 ml  Output 2100 ml  Net -1400 ml    Additional Objective Labs: Basic Metabolic Panel: Recent Labs  Lab 06/21/20 0252 06/23/20 1432 06/24/20 1240  NA 136 136 136  K 3.3* 3.3* 3.1*  CL 103 104 103  CO2 15* 19* 15*  GLUCOSE 193* 246* 253*  BUN 84* 68* 84*  CREATININE 5.62* 5.00* 5.49*  CALCIUM 7.1* 7.1* 7.1*  PHOS 7.0* 4.9* 5.5*   Liver Function Tests: Recent Labs  Lab 06/21/20 0252 06/23/20 1432 06/24/20 1240  ALBUMIN 1.8* 2.0* 1.9*   CBC: Recent Labs  Lab 06/19/20 0805 06/21/20 0252 06/24/20 1240  WBC 13.6* 10.1 12.0*  HGB 13.6 12.8* 11.9*  HCT 44.8 41.5 37.4*  MCV 86.2 87.0 87.8  PLT 169 139* 111*   CBG: Recent Labs  Lab 06/24/20 1121 06/24/20 1721 06/24/20 2222 06/25/20 0638 06/25/20 1146  GLUCAP 231* 251* 314* 232* 194*    Medications: . sodium chloride    . vancomycin Stopped (06/24/20 1422)   . apixaban  5 mg Oral BID  . aspirin  81 mg Oral Daily  . atorvastatin  10 mg Oral Q M,W,F  . Chlorhexidine Gluconate Cloth  6 each  Topical Q0600  . Chlorhexidine Gluconate Cloth  6 each Topical Q0600  . hydrALAZINE  10 mg Oral TID  . insulin aspart  0-15 Units Subcutaneous TID WC  . insulin aspart  0-5 Units Subcutaneous QHS  . isosorbide mononitrate  15 mg Oral Daily  . lipase/protease/amylase  12,000 Units Oral TID AC  . magnesium chloride  2 tablet Oral BID  . metoprolol succinate  25 mg Oral Daily  . pantoprazole  80 mg Oral Daily  . [START ON 06/26/2020] predniSONE  5 mg Oral Q breakfast  . sertraline  50 mg Oral QHS  . sevelamer carbonate  800 mg Oral TID WC  . sodium chloride flush  3 mL Intravenous Q12H  . tacrolimus  2 mg Oral BID  . tamsulosin  0.4 mg Oral QPC supper    Dialysis Orders: TTS NW 3.5h 66kh 3K/2.25 bath RFA AVF Hep none - hect 2 ug - mircera 200 q2 , last 1/13  CXR 1/23 - IMPRESSION: Diffuse interstitial prominence with streaky interstitial opacities within the mid to lower lung fields and small left pleural effusion. Findings may reflect multifocal atypical/viral infection versus pulmonary edema.  Assessment/Plan: 1. COVID-19 pneumonia:per admitting team.First positive test was 06/12/19.Now off isolation precautions  and symptoms improving.s/p remdesivir and solumedrol. ON prednisone currently. 2. Enterococcus bacteremia: ID recommended 2 weeks of IV abx, last dose 2/7.Echo negative for vegetations.Can continue vancomycin with outpatient HD if patient is discharged before 2/7. 3. Tachybradycardia syndrome/A-fib RVR:On metoprolol and eliquis.  4. A/C combined CHF: LVEF 25-30% here, G2DD.On aspirin, lipitor, hydralazine, imdur andToprol with mild bradycardia. 5. Hypoxemia:nml Sharpes now, no SOB. admission + IS opacities by CXR. 6. ESRD:Usual TTS schedule. Next HD 06/27/20. Dialysis treatments are shortened here due to high inpatient census and staffing shortage. 7. Hypokalemia:lastK+ 3.1 pre HD yesterday, using increase K bath.   8. Hypertension/volume:BPwell controlled.Well under EDW. Will need lower EDW at d/c - weights 62-63kg. 9. Anemia:Hgb 11.9, no ESA for now. 10. Metabolic bone disease:CorrCa ok, Phoselevated. Resume home binders (Renvela). 11. T1DM: Insulin per primary 12. Disposition - pt can return to his outpatient HD as of 06/27/20 at [facility] but will need to wear an N95 mask for another 7 days on treatment. Awaiting SNF placement.     Jen Mow, PA-C [facility] 06/25/2020,12:35 PM  LOS: 14 days   I have seen and examined this patient and agree with plan and assessment in the above note with renal recommendations/intervention highlighted.  Governor Rooks Montague Corella,MD 06/25/2020 12:46 PM

## 2020-06-26 ENCOUNTER — Inpatient Hospital Stay (HOSPITAL_COMMUNITY): Payer: Medicare Other

## 2020-06-26 DIAGNOSIS — I5031 Acute diastolic (congestive) heart failure: Secondary | ICD-10-CM | POA: Diagnosis not present

## 2020-06-26 DIAGNOSIS — N186 End stage renal disease: Secondary | ICD-10-CM | POA: Diagnosis not present

## 2020-06-26 DIAGNOSIS — R7881 Bacteremia: Secondary | ICD-10-CM | POA: Diagnosis not present

## 2020-06-26 DIAGNOSIS — U071 COVID-19: Secondary | ICD-10-CM | POA: Diagnosis not present

## 2020-06-26 LAB — GLUCOSE, CAPILLARY
Glucose-Capillary: 195 mg/dL — ABNORMAL HIGH (ref 70–99)
Glucose-Capillary: 166 mg/dL — ABNORMAL HIGH (ref 70–99)
Glucose-Capillary: 153 mg/dL — ABNORMAL HIGH (ref 70–99)
Glucose-Capillary: 181 mg/dL — ABNORMAL HIGH (ref 70–99)

## 2020-06-26 MED ORDER — IOHEXOL 9 MG/ML PO SOLN
ORAL | Status: AC
  Administered 2020-06-26: 500 mL
  Filled 2020-06-26: qty 1000

## 2020-06-26 NOTE — Progress Notes (Signed)
Physical Therapy Treatment Patient Details Name: Paul [name] MRN: [id number] DOB: Dec 03, 1955 Today's Date: 06/26/2020    History of Present Illness 65 yo male presenting to ED via EMS after missing dialysis and weakness with COVID-19 +. Off COVID precautions on 2/3. PMH including HTN, DM2, renal transplant, CKD st IV, calciphylaxis, A-Fib, bil. TTAs, bilateral BKA with prosthesis,and TIA.    PT Comments    Pt received in bed, pleasant and cooperative. Reported ongoing stomach issues and that he has not been eating. Pt did not want to try standing today. Pt agreed to do bed exercises. Pt completed 1 set of 10 of supine hip abduction and SLRs. Minimal manual resistance was added to 2nd set of 5 for both exercises. Pt was supervision for rolling, minAx2 for supine to sit with HOB elevated ~60 degrees, and min guard for sit to supine. Pt was able to perform 10 seated marches at EOB. Pt reported dizziness at sitting at EOB that did not change. Delayed BP measurement was 128/66. Continue to monitor BP. Pt's wife was present at end of session. Pt's wife reported concerns that pt's cognition and speech are different from baseline. She is concerned since he has a history of TIAs. No signs of TIA or acute stroke were present during PT session but wife's concerns were acknowledged and communicated to MD. Pt left in bed with all needs met, call bell within reach, and RN & wife present in room.   Follow Up Recommendations  SNF;Supervision/Assistance - 24 hour     Equipment Recommendations  Wheelchair cushion (measurements PT);Wheelchair (measurements PT);3in1 (PT);Rolling walker with 5" wheels    Recommendations for Other Services       Precautions / Restrictions Precautions Precautions: Fall;Other (comment) Precaution Comments: B BKA with prosthetics in room now Restrictions Weight Bearing Restrictions: No    Mobility  Bed Mobility   Bed Mobility: Supine to Sit;Rolling Rolling: Supervision    Supine to sit: +2 for physical assistance;HOB elevated;Min assist Sit to supine: Min guard   General bed mobility comments: Pt unable to perform supine to sit with HOB flat, needed HOB elevated and min A x 2  Transfers                 General transfer comment: Deferred due to fatigue and stomach issues  Ambulation/Gait             General Gait Details: Deferred due to fatigue and stomach issues   Stairs             Wheelchair Mobility    Modified Rankin (Stroke Patients Only)       Balance Overall balance assessment: Needs assistance Sitting-balance support: Bilateral upper extremity supported Sitting balance-Leahy Scale: Fair Sitting balance - Comments: Min guard for dynamic balance, minimal posterior lean Postural control: Posterior lean Standing balance support: Bilateral upper extremity supported Standing balance-Leahy Scale: Poor                              Cognition Arousal/Alertness: Awake/alert Behavior During Therapy: WFL for tasks assessed/performed;Flat affect Overall Cognitive Status: Impaired/Different from baseline Area of Impairment: Safety/judgement;Problem solving;Awareness                         Safety/Judgement: Decreased awareness of deficits Awareness: Emergent Problem Solving: Slow processing;Difficulty sequencing;Requires verbal cues        Exercises Other Exercises Other Exercises: Supine hip abduction, 1x10  on each side with no resistance, 1x5 on each side with minimal manual resistance Other Exercises: SLR, 1x10 on each leg with no resistance, 1x5 on each leg with minimal manual resistance Other Exercises: Seated marching on EOB, 1x10    General Comments General comments (skin integrity, edema, etc.): Reported dizziness following supine to sit, delayed BP measurement was 128/66      Pertinent Vitals/Pain Pain Assessment: Faces Faces Pain Scale: Hurts a little bit Pain Location: generalized  with stomach hurting more Pain Descriptors / Indicators: Discomfort Pain Intervention(s): Monitored during session (Did not do an OOB exercises/interventions)    Home Living                      Prior Function            PT Goals (current goals can now be found in the care plan section) Acute Rehab PT Goals Patient Stated Goal: be able to go home from hospital PT Goal Formulation: With patient Time For Goal Achievement: 06/27/20 Potential to Achieve Goals: Fair Progress towards PT goals: Not progressing toward goals - comment (Limited by fatigue)    Frequency    Min 2X/week      PT Plan Current plan remains appropriate    Co-evaluation              AM-PAC PT "6 Clicks" Mobility   Outcome Measure  Help needed turning from your back to your side while in a flat bed without using bedrails?: A Little Help needed moving from lying on your back to sitting on the side of a flat bed without using bedrails?: A Lot Help needed moving to and from a bed to a chair (including a wheelchair)?: A Little Help needed standing up from a chair using your arms (e.g., wheelchair or bedside chair)?: A Lot Help needed to walk in hospital room?: A Little Help needed climbing 3-5 steps with a railing? : A Lot 6 Click Score: 15    End of Session Equipment Utilized During Treatment: Gait belt Activity Tolerance: Patient tolerated treatment well Patient left: with call bell/phone within reach;in bed;with family/visitor present;with nursing/sitter in room (OT present and attending) Nurse Communication: Mobility status PT Visit Diagnosis: Other abnormalities of gait and mobility (R26.89);Muscle weakness (generalized) (M62.81)     Time:  -     Charges:                        Rosita Kea, SPT

## 2020-06-26 NOTE — Progress Notes (Addendum)
Subjective: Seen in room currently drinking contrast for CT abdomen evaluate abdominal pain/nausea.  HD schedule tomorrow  Objective Vital signs in last 24 hours: Vitals:   06/25/20 1730 06/25/20 2027 06/26/20 0510 06/26/20 0931  BP: (!) 151/65 129/68 (!) 168/91 (!) 144/64  Pulse: (!) 55 (!) 52 (!) 53 (!) 54  Resp: 18 20 18 17   Temp: 98.2 F (36.8 C) 98.3 F (36.8 C) 98.3 F (36.8 C) 98.2 F (36.8 C)  TempSrc:  Oral Oral   SpO2: 97% 97% 96% 95%  Weight:      Height:       Weight change:   Physical Exam General: Alert, chronically ill appearing male in NAD Heart:RRR, +7/1 systolic murmur Lungs:CTAB, nml WOB Abdomen: Bowel sounds normoactive, soft, minimal tenderness right mid and lower quadrant, nondistended Extremities:no LE edema, b/l BKA Dialysis Access: RU AVF +b/t   Dialysis Orders: TTS NW 3.5h 66kh 3K/2.25 bath RFA AVF Hep none - hect 2 ug - mircera 200 q2 , last 1/13    Problem/Plan: 1. COVID-19 pneumonia:per admitting team.First positive test was 06/12/19.Now off isolation precautions and symptoms improving.s/p remdesivir and solumedrol. ON prednisone currently. 2. ESRD:Usual TTS schedule. Next HD 06/27/20. Dialysis treatments are shortened here due to high inpatient census and staffing shortage 3. Hypokalemia:lastK+ 3.1 pre HD , using increase K bathe 4. Enterococcus bacteremia: ID recommended 2 weeks of IV abx, last dose 2/7.Echo negative for vegetations.Can continue vancomycin with outpatient HD if patient is discharged before 2/7. 5. Tachybradycardia syndrome/A-fib RVR:On metoprolol and eliquis.  6. A/C combined CHF: LVEF 25-30% here, G2DD.On aspirin, lipitor, hydralazine, imdur andToprol with mild bradycardia. 7. Hypoxemia:nml [place] now, no SOB. admission + IS opacities by CXR. 8. Hypertension/volume:BPwell controlled.Well under EDW. Will need lower EDW at d/c - weights 62-63kg. 9. Anemia:Hgb 11.9, no ESA for  now. 10. Metabolic bone disease:CorrCa ok, Phoselevated. Resume home binders (Renvela). 11. T1DM: Insulin per primary 12. Disposition - pt can return to his outpatient HD as of 06/27/20 at [facility] but will need to wear an N95 mask for another 7 days on treatment. Awaiting SNF placement.  Ernest Haber, PA-C [place] [phone] 06/26/2020,12:01 PM  LOS: 15 days   Pt seen, examined and agree w A/P as above.  Kelly Splinter  MD 06/26/2020, 5:18 PM    Labs: Basic Metabolic Panel: Recent Labs  Lab 06/21/20 0252 06/23/20 1432 06/24/20 1240  NA 136 136 136  K 3.3* 3.3* 3.1*  CL 103 104 103  CO2 15* 19* 15*  GLUCOSE 193* 246* 253*  BUN 84* 68* 84*  CREATININE 5.62* 5.00* 5.49*  CALCIUM 7.1* 7.1* 7.1*  PHOS 7.0* 4.9* 5.5*   Liver Function Tests: Recent Labs  Lab 06/21/20 0252 06/23/20 1432 06/24/20 1240  ALBUMIN 1.8* 2.0* 1.9*   No results for input(s): LIPASE, AMYLASE in the last 168 hours. No results for input(s): AMMONIA in the last 168 hours. CBC: Recent Labs  Lab 06/21/20 0252 06/24/20 1240  WBC 10.1 12.0*  HGB 12.8* 11.9*  HCT 41.5 37.4*  MCV 87.0 87.8  PLT 139* 111*   Cardiac Enzymes: No results for input(s): CKTOTAL, CKMB, CKMBINDEX, TROPONINI in the last 168 hours. CBG: Recent Labs  Lab 06/25/20 1623 06/25/20 2151 06/25/20 2207 06/26/20 0736 06/26/20 1138  GLUCAP 244* 272* 260* 195* 166*    Studies/Results: No results found. Medications: . sodium chloride    . vancomycin Stopped (06/24/20 1422)   . apixaban  5 mg Oral BID  . aspirin  81 mg Oral Daily  . atorvastatin  10 mg Oral Q M,W,F  . Chlorhexidine Gluconate Cloth  6 each Topical Q0600  . Chlorhexidine Gluconate Cloth  6 each Topical Q0600  . hydrALAZINE  10 mg Oral TID  . insulin aspart  0-15 Units Subcutaneous TID WC  . insulin aspart  0-5 Units Subcutaneous QHS  . isosorbide mononitrate  15 mg Oral Daily  . lipase/protease/amylase  12,000 Units Oral TID AC  .  magnesium chloride  2 tablet Oral BID  . metoprolol succinate  25 mg Oral Daily  . pantoprazole  80 mg Oral Daily  . predniSONE  5 mg Oral Q breakfast  . sertraline  50 mg Oral QHS  . sevelamer carbonate  800 mg Oral TID WC  . sodium chloride flush  3 mL Intravenous Q12H  . tacrolimus  2 mg Oral BID  . tamsulosin  0.4 mg Oral QPC supper

## 2020-06-26 NOTE — Progress Notes (Signed)
Patient off the floor in CT.

## 2020-06-26 NOTE — Progress Notes (Signed)
PROGRESS NOTE    Paul [name]  [name] DOB: [birth year] DOA: 06/11/2020 PCP: [name], MD   Brief Narrative: Paul [name] is a 65 y.o. male with a history of paroxysmal A. fib, PAD, ESRD with failed renal transplant on HD TTS, DM2, diastolic CHF. Patient presented secondary to dyspnea in setting of missed dialysis and found to be COVID-19 positive. He also was found to have an enterococcus bacteremia and pneumonia from COVID-19 infection requiring treatment. While admitted, an Transthoracic Echocardiogram was significant for severely reduced EF heart failure.   Assessment & Plan:  Principal Problem:   Bacteremia due to Enterococcus Active Problems:   ESRD on hemodialysis (HCC)   DM (diabetes mellitus) (HCC)   GERD (gastroesophageal reflux disease)   PAF (paroxysmal atrial fibrillation) (HCC)   COVID-19 virus infection   Acute heart failure with preserved ejection fraction (HFpEF) ([place])   Acute respiratory failure with hypoxia Secondary to COVID-19 pneumonia in addition to fluid overload. Resolved.  Transient enterococcus faecalis bacteremia Likely urine source; no urine culture obtained. Transthoracic Echocardiogram obtained and was significant for an EF of 25-30% with no evidence of valvular vegetations. ID recommended a total of 2 weeks of IV antibiotics. Repeat blood cultures (1/25) no growth to date. Last day of antibiotics on 06/26/2020. -Continue Vancomycin IV  Sepsis Present on admission. Secondary to above. Management of infection as mentioned above.  COVID-19 pneumonia Patient treated with Remdesivir IV for a total of 5 days. He was also treated with Solu-medrol which was transitioned to prednisone.  Abdominal mass Unknown etiology. -CT abdomen pelvis  Acute metabolic encephalopathy Resolved. Possibly secondary to infection.  ESRD on HD Nephrology consulted.  Paroxysmal atrial fibrillation with RVR Tachybradycardia syndrome RVR  resolved. -Continue metoprolol and Eliquis  Acute systolic heart failure Chronic diastolic heart failure EF of 25% on most recent Transthoracic Echocardiogram. Cardiology consulted and recommended hydralazine, Imdur. Beta-blocker held secondary to bradycardia and Entresto held secondary to renal disease. Recommend repeat Transthoracic Echocardiogram in 2-3 months for possible need for ischemic workup.  Diabetes mellitus, type 2 -Continue SSI  Primary hypertension -Continue metoprolol, Imdur  GERD -Continue Protonix  Hyperlipidemia -Continue Lipitor  Depression -Continue Zoloft  BPH -Continue Flomax  Dysphagia Evaluated by speech therapy. Recommendation for regular consistency diet.  Pressure injury Sacrum, POA   DVT prophylaxis: Eliquis Code Status:   Code Status: Full Code Family Communication: None at bedside Disposition Plan: Discharge to SNF when bed is available. Medically stable for discharge pending result of CT abdomen/pelvis   Consultants:   Nephrology  Cardilogy  Infectious disease  Procedures:   TRANSTHORACIC ECHOCARDIOGRAM (06/13/2020) IMPRESSIONS    1. Limited study to assess LV function; not all views obtained.  2. Left ventricular ejection fraction, by estimation, is 25 to 30%. The  left ventricle has severely decreased function. The left ventricle  demonstrates global hypokinesis. Left ventricular diastolic parameters are  consistent with Grade II diastolic  dysfunction (pseudonormalization).  3. Right ventricular systolic function is mildly reduced. The right  ventricular size is normal. There is normal pulmonary artery systolic  pressure.  4. Left atrial size was moderately dilated.  5. Right atrial size was mildly dilated.  6. Moderate pleural effusion in the left lateral region.  7. The mitral valve is normal in structure. Mild mitral valve  regurgitation. No evidence of mitral stenosis.  8. The aortic valve is tricuspid.  Aortic valve regurgitation is mild.  Mild to moderate aortic valve sclerosis/calcification is present, without  any evidence  of aortic stenosis.  9. The inferior vena cava is normal in size with greater than 50%  respiratory variability, suggesting right atrial pressure of 3 mmHg.  Antimicrobials:  Vancomycin   Remdesivir  Subjective: Some abdominal discomfort this morning when eating breakfast. No other concerns.  Objective: Vitals:   06/25/20 0943 06/25/20 1730 06/25/20 2027 06/26/20 0510  BP: 137/63 (!) 151/65 129/68 (!) 168/91  Pulse: (!) 55 (!) 55 (!) 52 (!) 53  Resp: 17 18 20 18   Temp: 98.5 F (36.9 C) 98.2 F (36.8 C) 98.3 F (36.8 C) 98.3 F (36.8 C)  TempSrc:   Oral Oral  SpO2: 95% 97% 97% 96%  Weight:      Height:        Intake/Output Summary (Last 24 hours) at 06/26/2020 0917 Last data filed at 06/26/2020 0820 Gross per 24 hour  Intake 960 ml  Output 0 ml  Net 960 ml   Filed Weights   06/23/20 0500 06/24/20 1145 06/24/20 1430  Weight: 63.5 kg 64.4 kg 62 kg    Examination:  General exam: Appears calm and comfortable Respiratory system: Clear to auscultation. Respiratory effort normal. Cardiovascular system: S1 & S2 heard, RRR. No murmurs, rubs, gallops or clicks. Gastrointestinal system: Abdomen is nondistended, soft and nontender. RLQ mass felt with deep palpation that was tender. Normal bowel sounds heard. Central nervous system: Alert and oriented. No focal neurological deficits. Musculoskeletal: No edema. Bilateral BKA Skin: No cyanosis. No rashes Psychiatry: Judgement and insight appear normal. Mood & affect appropriate.    Data Reviewed: I have personally reviewed following labs and imaging studies  CBC Lab Results  Component Value Date   WBC 12.0 (H) 06/24/2020   RBC 4.26 06/24/2020   HGB 11.9 (L) 06/24/2020   HCT 37.4 (L) 06/24/2020   MCV 87.8 06/24/2020   MCH 27.9 06/24/2020   PLT 111 (L) 06/24/2020   MCHC 31.8 06/24/2020   RDW 20.8  (H) 06/24/2020   LYMPHSABS 0.6 (L) 06/16/2020   MONOABS 0.5 06/16/2020   EOSABS 0.0 06/16/2020   BASOSABS 0.0 37/85/8850     Last metabolic panel Lab Results  Component Value Date   NA 136 06/24/2020   K 3.1 (L) 06/24/2020   CL 103 06/24/2020   CO2 15 (L) 06/24/2020   BUN 84 (H) 06/24/2020   CREATININE 5.49 (H) 06/24/2020   GLUCOSE 253 (H) 06/24/2020   GFRNONAA 11 (L) 06/24/2020   GFRAA 20 (L) 01/27/2020   CALCIUM 7.1 (L) 06/24/2020   PHOS 5.5 (H) 06/24/2020   PROT 4.3 (L) 06/16/2020   ALBUMIN 1.9 (L) 06/24/2020   BILITOT 0.8 06/16/2020   ALKPHOS 70 06/16/2020   AST 25 06/16/2020   ALT 20 06/16/2020   ANIONGAP 18 (H) 06/24/2020    CBG (last 3)  Recent Labs    06/25/20 2151 06/25/20 2207 06/26/20 0736  GLUCAP 272* 260* 195*     GFR: Estimated Creatinine Clearance: 11.6 mL/min (A) (by C-G formula based on SCr of 5.49 mg/dL (H)).  Coagulation Profile: No results for input(s): INR, PROTIME in the last 168 hours.  No results found for this or any previous visit (from the past 240 hour(s)).      Radiology Studies: No results found.      Scheduled Meds: . apixaban  5 mg Oral BID  . aspirin  81 mg Oral Daily  . atorvastatin  10 mg Oral Q M,W,F  . Chlorhexidine Gluconate Cloth  6 each Topical Q0600  . Chlorhexidine Gluconate Cloth  6 each Topical Q0600  . hydrALAZINE  10 mg Oral TID  . insulin aspart  0-15 Units Subcutaneous TID WC  . insulin aspart  0-5 Units Subcutaneous QHS  . isosorbide mononitrate  15 mg Oral Daily  . lipase/protease/amylase  12,000 Units Oral TID AC  . magnesium chloride  2 tablet Oral BID  . metoprolol succinate  25 mg Oral Daily  . pantoprazole  80 mg Oral Daily  . predniSONE  5 mg Oral Q breakfast  . sertraline  50 mg Oral QHS  . sevelamer carbonate  800 mg Oral TID WC  . sodium chloride flush  3 mL Intravenous Q12H  . tacrolimus  2 mg Oral BID  . tamsulosin  0.4 mg Oral QPC supper   Continuous Infusions: . sodium  chloride    . vancomycin Stopped (06/24/20 1422)     LOS: 15 days     Cordelia Poche, MD [facility] 06/26/2020, 9:17 AM  If 7PM-7AM, please contact night-coverage [email or web address]

## 2020-06-27 DIAGNOSIS — N186 End stage renal disease: Secondary | ICD-10-CM | POA: Diagnosis not present

## 2020-06-27 DIAGNOSIS — U071 COVID-19: Secondary | ICD-10-CM | POA: Diagnosis not present

## 2020-06-27 DIAGNOSIS — R7881 Bacteremia: Secondary | ICD-10-CM | POA: Diagnosis not present

## 2020-06-27 DIAGNOSIS — I5031 Acute diastolic (congestive) heart failure: Secondary | ICD-10-CM | POA: Diagnosis not present

## 2020-06-27 LAB — RENAL FUNCTION PANEL
Albumin: 1.9 g/dL — ABNORMAL LOW (ref 3.5–5.0)
Anion gap: 15 (ref 5–15)
BUN: 84 mg/dL — ABNORMAL HIGH (ref 8–23)
CO2: 16 mmol/L — ABNORMAL LOW (ref 22–32)
Calcium: 7.3 mg/dL — ABNORMAL LOW (ref 8.9–10.3)
Chloride: 104 mmol/L (ref 98–111)
Creatinine, Ser: 6.43 mg/dL — ABNORMAL HIGH (ref 0.61–1.24)
GFR, Estimated: 9 mL/min — ABNORMAL LOW (ref >60)
Glucose, Bld: 254 mg/dL — ABNORMAL HIGH (ref 70–99)
Phosphorus: 5.6 mg/dL — ABNORMAL HIGH (ref 2.5–4.6)
Potassium: 4 mmol/L (ref 3.5–5.1)
Sodium: 135 mmol/L (ref 135–145)

## 2020-06-27 LAB — CBC
HCT: 38.6 % — ABNORMAL LOW (ref 39.0–52.0)
Hemoglobin: 11.5 g/dL — ABNORMAL LOW (ref 13.0–17.0)
MCH: 26.9 pg (ref 26.0–34.0)
MCHC: 29.8 g/dL — ABNORMAL LOW (ref 30.0–36.0)
MCV: 90.4 fL (ref 80.0–100.0)
Platelets: 90 10*3/uL — ABNORMAL LOW (ref 150–400)
RBC: 4.27 MIL/uL (ref 4.22–5.81)
RDW: 20.7 % — ABNORMAL HIGH (ref 11.5–15.5)
WBC: 12.6 10*3/uL — ABNORMAL HIGH (ref 4.0–10.5)
nRBC: 0 % (ref 0.0–0.2)

## 2020-06-27 LAB — GLUCOSE, CAPILLARY
Glucose-Capillary: 242 mg/dL — ABNORMAL HIGH (ref 70–99)
Glucose-Capillary: 233 mg/dL — ABNORMAL HIGH (ref 70–99)
Glucose-Capillary: 279 mg/dL — ABNORMAL HIGH (ref 70–99)
Glucose-Capillary: 175 mg/dL — ABNORMAL HIGH (ref 70–99)

## 2020-06-27 MED ORDER — ALTEPLASE 2 MG IJ SOLR: 2.0000 mg | Freq: Once | INTRAMUSCULAR | Status: DC | PRN

## 2020-06-27 MED ORDER — SODIUM CHLORIDE 0.9 % IV SOLN: 100.0000 mL | INTRAVENOUS | Status: DC | PRN

## 2020-06-27 MED ORDER — HEPARIN SODIUM (PORCINE) 1000 UNIT/ML DIALYSIS: 1000.0000 [IU] | INTRAMUSCULAR | Status: DC | PRN

## 2020-06-27 MED ORDER — LIDOCAINE-PRILOCAINE 2.5-2.5 % EX CREA: 1.0000 "application " | TOPICAL_CREAM | CUTANEOUS | Status: DC | PRN

## 2020-06-27 MED ORDER — LIDOCAINE HCL (PF) 1 % IJ SOLN: 5.0000 mL | INTRAMUSCULAR | Status: DC | PRN

## 2020-06-27 MED ORDER — VANCOMYCIN HCL IN DEXTROSE 750-5 MG/150ML-% IV SOLN
INTRAVENOUS | Status: AC
  Administered 2020-06-27: 750 mg via INTRAVENOUS
  Filled 2020-06-27: qty 150

## 2020-06-27 MED ORDER — INSULIN GLARGINE 100 UNIT/ML ~~LOC~~ SOLN
10.0000 [IU] | Freq: Every day | SUBCUTANEOUS | Status: DC
  Administered 2020-06-27 – 2020-06-28 (×2): 10 [IU] via SUBCUTANEOUS
  Filled 2020-06-27 (×2): qty 0.1

## 2020-06-27 MED ORDER — PENTAFLUOROPROP-TETRAFLUOROETH EX AERO: 1.0000 "application " | INHALATION_SPRAY | CUTANEOUS | Status: DC | PRN

## 2020-06-27 NOTE — Progress Notes (Signed)
PROGRESS NOTE    Paul [name]  FMB:[id number] DOB: May 27, 1955 DOA: 06/11/2020 PCP: [name], MD   Brief Narrative: Paul [name] is a 65 y.o. male with a history of paroxysmal A. fib, PAD, ESRD with failed renal transplant on HD TTS, DM2, diastolic CHF. Patient presented secondary to dyspnea in setting of missed dialysis and found to be COVID-19 positive. He also was found to have an enterococcus bacteremia and pneumonia from COVID-19 infection requiring treatment. While admitted, an Transthoracic Echocardiogram was significant for severely reduced EF heart failure.   Assessment & Plan:  Principal Problem:   Bacteremia due to Enterococcus Active Problems:   ESRD on hemodialysis (HCC)   DM (diabetes mellitus) (HCC)   GERD (gastroesophageal reflux disease)   PAF (paroxysmal atrial fibrillation) (HCC)   COVID-19 virus infection   Acute heart failure with preserved ejection fraction (HFpEF) ([place])   Acute respiratory failure with hypoxia Secondary to COVID-19 pneumonia in addition to fluid overload. Resolved.  Transient enterococcus faecalis bacteremia Likely urine source; no urine culture obtained. Transthoracic Echocardiogram obtained and was significant for an EF of 25-30% with no evidence of valvular vegetations. ID recommended a total of 2 weeks of IV antibiotics. Repeat blood cultures (1/25) no growth to date. Last day of antibiotics on 06/26/2020. -Continue Vancomycin IV  Sepsis Present on admission. Secondary to above. Management of infection as mentioned above.  COVID-19 pneumonia Patient treated with Remdesivir IV for a total of 5 days. He was also treated with Solu-medrol which was transitioned to prednisone.  Abdominal mass Mass correlates with transplanted kidney.  Acute metabolic encephalopathy Resolved. Possibly secondary to infection.  ESRD on HD TTS. Nephrology consulted.  Paroxysmal atrial fibrillation with RVR Tachybradycardia syndrome RVR  resolved. -Continue metoprolol and Eliquis  Acute systolic heart failure Chronic diastolic heart failure EF of 25% on most recent Transthoracic Echocardiogram. Cardiology consulted and recommended hydralazine, Imdur. Beta-blocker held secondary to bradycardia and Entresto held secondary to renal disease. Recommend repeat Transthoracic Echocardiogram in 2-3 months for possible need for ischemic workup.  Diabetes mellitus, type 2 -Continue SSI  Primary hypertension -Continue metoprolol, Imdur  GERD -Continue Protonix  Hyperlipidemia -Continue Lipitor  Depression -Continue Zoloft  BPH -Continue Flomax  Dysphagia Evaluated by speech therapy. Recommendation for regular consistency diet.  Pressure injury Sacrum, POA   DVT prophylaxis: Eliquis Code Status:   Code Status: Full Code Family Communication: None at bedside. Called wife but no response. Disposition Plan: Discharge to SNF when bed is available. Medically stable for discharge.   Consultants:   Nephrology  Cardilogy  Infectious disease  Procedures:   TRANSTHORACIC ECHOCARDIOGRAM (06/13/2020) IMPRESSIONS    1. Limited study to assess LV function; not all views obtained.  2. Left ventricular ejection fraction, by estimation, is 25 to 30%. The  left ventricle has severely decreased function. The left ventricle  demonstrates global hypokinesis. Left ventricular diastolic parameters are  consistent with Grade II diastolic  dysfunction (pseudonormalization).  3. Right ventricular systolic function is mildly reduced. The right  ventricular size is normal. There is normal pulmonary artery systolic  pressure.  4. Left atrial size was moderately dilated.  5. Right atrial size was mildly dilated.  6. Moderate pleural effusion in the left lateral region.  7. The mitral valve is normal in structure. Mild mitral valve  regurgitation. No evidence of mitral stenosis.  8. The aortic valve is tricuspid. Aortic  valve regurgitation is mild.  Mild to moderate aortic valve sclerosis/calcification is present, without  any  evidence of aortic stenosis.  9. The inferior vena cava is normal in size with greater than 50%  respiratory variability, suggesting right atrial pressure of 3 mmHg.  Antimicrobials:  Vancomycin   Remdesivir  Subjective: No concerns this morning. Abdominal discomfort is resolved. Seen while in HD.  Objective: Vitals:   06/27/20 0725 06/27/20 0730 06/27/20 0800 06/27/20 0830  BP: (!) 149/58 (!) 162/82 117/67 118/69  Pulse:      Resp: 16 15 19 15   Temp:      TempSrc:      SpO2:      Weight:      Height:        Intake/Output Summary (Last 24 hours) at 06/27/2020 0840 Last data filed at 06/27/2020 0540 Gross per 24 hour  Intake 600 ml  Output 1 ml  Net 599 ml   Filed Weights   06/24/20 1430 06/26/20 2146 06/27/20 0720  Weight: 62 kg 62 kg 63.6 kg    Examination:  General exam: Appears calm and comfortable Respiratory system: Clear to auscultation. Respiratory effort normal. Cardiovascular system: S1 & S2 heard, RRR. No murmurs, rubs, gallops or clicks. Gastrointestinal system: Abdomen is nondistended, soft and nontender. Normal bowel sounds heard. Central nervous system: Alert and oriented. No focal neurological deficits. Musculoskeletal: No edema. Bilateral BKA Skin: No cyanosis. No rashes Psychiatry: Judgement and insight appear normal. Mood & affect appropriate.     Data Reviewed: I have personally reviewed following labs and imaging studies  CBC Lab Results  Component Value Date   WBC 12.0 (H) 06/24/2020   RBC 4.26 06/24/2020   HGB 11.9 (L) 06/24/2020   HCT 37.4 (L) 06/24/2020   MCV 87.8 06/24/2020   MCH 27.9 06/24/2020   PLT 111 (L) 06/24/2020   MCHC 31.8 06/24/2020   RDW 20.8 (H) 06/24/2020   LYMPHSABS 0.6 (L) 06/16/2020   MONOABS 0.5 06/16/2020   EOSABS 0.0 06/16/2020   BASOSABS 0.0 63/33/5456     Last metabolic panel Lab Results   Component Value Date   NA 136 06/24/2020   K 3.1 (L) 06/24/2020   CL 103 06/24/2020   CO2 15 (L) 06/24/2020   BUN 84 (H) 06/24/2020   CREATININE 5.49 (H) 06/24/2020   GLUCOSE 253 (H) 06/24/2020   GFRNONAA 11 (L) 06/24/2020   GFRAA 20 (L) 01/27/2020   CALCIUM 7.1 (L) 06/24/2020   PHOS 5.5 (H) 06/24/2020   PROT 4.3 (L) 06/16/2020   ALBUMIN 1.9 (L) 06/24/2020   BILITOT 0.8 06/16/2020   ALKPHOS 70 06/16/2020   AST 25 06/16/2020   ALT 20 06/16/2020   ANIONGAP 18 (H) 06/24/2020    CBG (last 3)  Recent Labs    06/26/20 1630 06/26/20 2146 06/27/20 0645  GLUCAP 153* 181* 242*     GFR: Estimated Creatinine Clearance: 11.9 mL/min (A) (by C-G formula based on SCr of 5.49 mg/dL (H)).  Coagulation Profile: No results for input(s): INR, PROTIME in the last 168 hours.  No results found for this or any previous visit (from the past 240 hour(s)).      Radiology Studies: CT ABDOMEN PELVIS WO CONTRAST  Result Date: 06/26/2020 CLINICAL DATA:  Abdominal mass, failed renal transplant, sepsis EXAM: CT ABDOMEN AND PELVIS WITHOUT CONTRAST TECHNIQUE: Multidetector CT imaging of the abdomen and pelvis was performed following the standard protocol without IV contrast. COMPARISON:  04/23/2019 FINDINGS: Lower chest: Moderate left pleural effusion, with dense left lower lobe consolidation likely atelectasis. Right chest is clear. Extensive coronary artery atherosclerosis. Hepatobiliary: Chronic  pneumobilia related to prior choledocho jejunostomy. Gallbladder surgically absent. No focal liver abnormalities on this limited unenhanced exam. Pancreas: Pancreatic head is likely surgically absent. Visualized portions of the body and tail are atrophic. Spleen: Normal in size without focal abnormality. Adrenals/Urinary Tract: The bilateral native kidneys are markedly atrophic. Nonobstructing calculi are seen within the native kidneys, measuring up to 10 mm in size. There is a transplant kidney in the right  lower quadrant, with preserved cortical thickness. Punctate calcifications at the transplant hilum may be vascular or related to nonobstructing calculi. No hydronephrosis. The bladder is minimally distended without focal abnormality. Adrenals are normal. Stomach/Bowel: No bowel obstruction or ileus. No bowel wall thickening or inflammatory change. Postsurgical changes are seen from duodenal resection. Evaluation at the surgical bed is limited without IV contrast. Vascular/Lymphatic: Aortic atherosclerosis. No enlarged abdominal or pelvic lymph nodes. Reproductive: Prostate is unremarkable. Other: Small volume ascites is seen within the bilateral flanks. No free intraperitoneal gas. No abdominal wall hernia. Musculoskeletal: There are no acute or destructive bony lesions. Chronic L3 compression deformity again noted. Mild diffuse body wall edema. Reconstructed images demonstrate no additional findings. IMPRESSION: 1. Moderate left pleural effusion, with compressive left lower lobe atelectasis. 2. Small volume ascites. 3. Chronic renal failure, with unremarkable transplant kidney right lower quadrant. Multiple nonobstructing renal calculi as above. 4. Postsurgical changes from Whipple procedure. 5. Aortic Atherosclerosis (ICD10-I70.0). Coronary artery atherosclerosis. Electronically Signed   By: Randa Ngo M.D.   On: 06/26/2020 15:13        Scheduled Meds: . apixaban  5 mg Oral BID  . aspirin  81 mg Oral Daily  . atorvastatin  10 mg Oral Q M,W,F  . Chlorhexidine Gluconate Cloth  6 each Topical Q0600  . Chlorhexidine Gluconate Cloth  6 each Topical Q0600  . hydrALAZINE  10 mg Oral TID  . insulin aspart  0-15 Units Subcutaneous TID WC  . insulin aspart  0-5 Units Subcutaneous QHS  . isosorbide mononitrate  15 mg Oral Daily  . lipase/protease/amylase  12,000 Units Oral TID AC  . magnesium chloride  2 tablet Oral BID  . metoprolol succinate  25 mg Oral Daily  . pantoprazole  80 mg Oral Daily  .  predniSONE  5 mg Oral Q breakfast  . sertraline  50 mg Oral QHS  . sevelamer carbonate  800 mg Oral TID WC  . sodium chloride flush  3 mL Intravenous Q12H  . tacrolimus  2 mg Oral BID  . tamsulosin  0.4 mg Oral QPC supper   Continuous Infusions: . sodium chloride    . sodium chloride    . sodium chloride    . Vancomycin    . vancomycin Stopped (06/24/20 1422)     LOS: 16 days     Cordelia Poche, MD [facility] 06/27/2020, 8:40 AM  If 7PM-7AM, please contact night-coverage [email or web address]

## 2020-06-27 NOTE — Plan of Care (Signed)
  Problem: Education: Goal: Knowledge of disease and its progression will improve Outcome: Completed/Met   Problem: Fluid Volume: Goal: Compliance with measures to maintain balanced fluid volume will improve Outcome: Completed/Met   Problem: Health Behavior/Discharge Planning: Goal: Ability to manage health-related needs will improve Outcome: Completed/Met   Problem: Nutritional: Goal: Ability to make healthy dietary choices will improve Outcome: Completed/Met   Problem: Clinical Measurements: Goal: Complications related to the disease process, condition or treatment will be avoided or minimized Outcome: Completed/Met   

## 2020-06-27 NOTE — Progress Notes (Signed)
Subjective:  No complaint on dialysis tolerated diet no abdominal pain  Objective Vital signs in last 24 hours: Vitals:   06/27/20 0900 06/27/20 0930 06/27/20 0956 06/27/20 1002  BP: 112/67 118/66 138/75 131/72  Pulse:      Resp: 19 18 13 13   Temp:   98 F (36.7 C)   TempSrc:   Oral   SpO2:   99%   Weight:   62.2 kg   Height:       Weight change:    Physical Exam General: Alert, chronically ill appearing male in NAD Heart:RRR, +8/2 systolic murmur Lungs:CTAB, nml WOB Abdomen: Bowel sounds normoactive, soft, minimal tenderness right mid and lower quadrant, nondistended Extremities:no LE edema, b/l BKA Dialysis Access:RU AVF patent on hemodialysis   Dialysis Orders: TTS NW 3.5h 66kh 3K/2.25 bath RFA AVF Hep none - hect 2 ug - mircera 200 q2 , last 1/13  Problem/Plan: 1. COVID-19 pneumonia:per admitting team.First positive test was 06/12/19.Now off isolation precautions and symptoms improving.s/p remdesivir and solumedrol. ON prednisone currently. 2. ESRD:Usual TTS schedule. Dialysis treatments are shortened here due to high inpatient census and staffing shortage 3. Hypokalemia:K+4.0 pre HD , using increase K bath 4. Enterococcus bacteremia: ID recommended 2 weeks of IV abx, last dose 2/7.Echo negative for vegetations 5. Tachybradycardia syndrome/A-fib RVR:On metoprolol and eliquis.  6. A/C combined CHF: LVEF 25-30% here, G2DD.On aspirin, lipitor, hydralazine, imdur andToprol with mild bradycardia. 7. Hypoxemia:nml WOBonRA now,no SOB.admission + IS opacities by CXR. 8. Hypertension/volume:BPwell controlled.Well under EDW. Will need lower EDW atd/c - weights 62-63kg. 9. Anemia:Hgb11.9, no ESA for now. 10. Metabolic bone disease:CorrCa ok, Phoselevated. Resume home binders (Renvela). 11. T1DM: Insulin per primary 12. Disposition - pt can return to his outpatient HD as of 06/27/20 at [facility] but will need to wear an N95 mask for another 7  days on treatment. Awaiting SNF placement  Ernest Haber, PA-C [facility] Beeper [phone] 06/27/2020,10:35 AM  LOS: 16 days   Labs: Basic Metabolic Panel: Recent Labs  Lab 06/23/20 1432 06/24/20 1240 06/27/20 0736  NA 136 136 135  K 3.3* 3.1* 4.0  CL 104 103 104  CO2 19* 15* 16*  GLUCOSE 246* 253* 254*  BUN 68* 84* 84*  CREATININE 5.00* 5.49* 6.43*  CALCIUM 7.1* 7.1* 7.3*  PHOS 4.9* 5.5* 5.6*   Liver Function Tests: Recent Labs  Lab 06/23/20 1432 06/24/20 1240 06/27/20 0736  ALBUMIN 2.0* 1.9* 1.9*   No results for input(s): LIPASE, AMYLASE in the last 168 hours. No results for input(s): AMMONIA in the last 168 hours. CBC: Recent Labs  Lab 06/21/20 0252 06/24/20 1240 06/27/20 0736  WBC 10.1 12.0* 12.6*  HGB 12.8* 11.9* 11.5*  HCT 41.5 37.4* 38.6*  MCV 87.0 87.8 90.4  PLT 139* 111* 90*   Cardiac Enzymes: No results for input(s): CKTOTAL, CKMB, CKMBINDEX, TROPONINI in the last 168 hours. CBG: Recent Labs  Lab 06/26/20 0736 06/26/20 1138 06/26/20 1630 06/26/20 2146 06/27/20 0645  GLUCAP 195* 166* 153* 181* 242*    Studies/Results: CT ABDOMEN PELVIS WO CONTRAST  Result Date: 06/26/2020 CLINICAL DATA:  Abdominal mass, failed renal transplant, sepsis EXAM: CT ABDOMEN AND PELVIS WITHOUT CONTRAST TECHNIQUE: Multidetector CT imaging of the abdomen and pelvis was performed following the standard protocol without IV contrast. COMPARISON:  04/23/2019 FINDINGS: Lower chest: Moderate left pleural effusion, with dense left lower lobe consolidation likely atelectasis. Right chest is clear. Extensive coronary artery atherosclerosis. Hepatobiliary: Chronic pneumobilia related to prior choledocho jejunostomy. Gallbladder surgically absent. No  focal liver abnormalities on this limited unenhanced exam. Pancreas: Pancreatic head is likely surgically absent. Visualized portions of the body and tail are atrophic. Spleen: Normal in size without focal abnormality.  Adrenals/Urinary Tract: The bilateral native kidneys are markedly atrophic. Nonobstructing calculi are seen within the native kidneys, measuring up to 10 mm in size. There is a transplant kidney in the right lower quadrant, with preserved cortical thickness. Punctate calcifications at the transplant hilum may be vascular or related to nonobstructing calculi. No hydronephrosis. The bladder is minimally distended without focal abnormality. Adrenals are normal. Stomach/Bowel: No bowel obstruction or ileus. No bowel wall thickening or inflammatory change. Postsurgical changes are seen from duodenal resection. Evaluation at the surgical bed is limited without IV contrast. Vascular/Lymphatic: Aortic atherosclerosis. No enlarged abdominal or pelvic lymph nodes. Reproductive: Prostate is unremarkable. Other: Small volume ascites is seen within the bilateral flanks. No free intraperitoneal gas. No abdominal wall hernia. Musculoskeletal: There are no acute or destructive bony lesions. Chronic L3 compression deformity again noted. Mild diffuse body wall edema. Reconstructed images demonstrate no additional findings. IMPRESSION: 1. Moderate left pleural effusion, with compressive left lower lobe atelectasis. 2. Small volume ascites. 3. Chronic renal failure, with unremarkable transplant kidney right lower quadrant. Multiple nonobstructing renal calculi as above. 4. Postsurgical changes from Whipple procedure. 5. Aortic Atherosclerosis (ICD10-I70.0). Coronary artery atherosclerosis. Electronically Signed   By: Randa Ngo M.D.   On: 06/26/2020 15:13   Medications: . sodium chloride     . apixaban  5 mg Oral BID  . aspirin  81 mg Oral Daily  . atorvastatin  10 mg Oral Q M,W,F  . Chlorhexidine Gluconate Cloth  6 each Topical Q0600  . Chlorhexidine Gluconate Cloth  6 each Topical Q0600  . hydrALAZINE  10 mg Oral TID  . insulin aspart  0-15 Units Subcutaneous TID WC  . insulin aspart  0-5 Units Subcutaneous QHS  .  isosorbide mononitrate  15 mg Oral Daily  . lipase/protease/amylase  12,000 Units Oral TID AC  . magnesium chloride  2 tablet Oral BID  . metoprolol succinate  25 mg Oral Daily  . pantoprazole  80 mg Oral Daily  . predniSONE  5 mg Oral Q breakfast  . sertraline  50 mg Oral QHS  . sevelamer carbonate  800 mg Oral TID WC  . sodium chloride flush  3 mL Intravenous Q12H  . tacrolimus  2 mg Oral BID  . tamsulosin  0.4 mg Oral QPC supper

## 2020-06-28 DIAGNOSIS — R7881 Bacteremia: Secondary | ICD-10-CM | POA: Diagnosis not present

## 2020-06-28 DIAGNOSIS — N186 End stage renal disease: Secondary | ICD-10-CM | POA: Diagnosis not present

## 2020-06-28 DIAGNOSIS — U071 COVID-19: Secondary | ICD-10-CM | POA: Diagnosis not present

## 2020-06-28 DIAGNOSIS — I5031 Acute diastolic (congestive) heart failure: Secondary | ICD-10-CM | POA: Diagnosis not present

## 2020-06-28 LAB — GLUCOSE, CAPILLARY
Glucose-Capillary: 46 mg/dL — ABNORMAL LOW (ref 70–99)
Glucose-Capillary: 85 mg/dL (ref 70–99)
Glucose-Capillary: 156 mg/dL — ABNORMAL HIGH (ref 70–99)
Glucose-Capillary: 88 mg/dL (ref 70–99)
Glucose-Capillary: 150 mg/dL — ABNORMAL HIGH (ref 70–99)

## 2020-06-28 MED ORDER — INSULIN GLARGINE 100 UNIT/ML ~~LOC~~ SOLN
7.0000 [IU] | Freq: Every day | SUBCUTANEOUS | Status: DC
  Administered 2020-06-29 – 2020-07-06 (×8): 7 [IU] via SUBCUTANEOUS
  Filled 2020-06-28 (×8): qty 0.07

## 2020-06-28 MED ORDER — ATORVASTATIN CALCIUM 10 MG PO TABS
10.0000 mg | ORAL_TABLET | ORAL | Status: DC
  Administered 2020-06-28 – 2020-07-19 (×10): 10 mg via ORAL
  Filled 2020-06-28 (×13): qty 1

## 2020-06-28 NOTE — Progress Notes (Signed)
Occupational Therapy Treatment Patient Details Name: Paul [name] MRN: [id number] DOB: 05-10-1956 Today's Date: 06/28/2020    History of present illness 65 yo male presenting to ED via EMS after missing dialysis and weakness with COVID-19 +. Off COVID precautions on 2/3. PMH including HTN, DM2, renal transplant, CKD st IV, calciphylaxis, A-Fib, bil. TTAs, bilateral BKA with prosthesis,and TIA.   OT comments  Pt progressing towards OT goals, able to demo LB dressing bed level and with lateral leans, so updated goals to progress LB ADLs in standing as pt still with balance deficits. Pt continues to require increased assist for bed mobility to sit up and to stand up using RW (Mod A x 2 with elevated bed). However, pt able to demo improved performance once up on feet and able to mobilize at min guard using RW. Discussed pt's bowel incontinence mgmt routine at home with plan to have pt return demo this routine, as well as progress sit to stand transfers during LB ADLs. Based on pt's progress and potential to improve functional abilities quickly, recommend CIR consult. Pt reports he has been to CIR before (prior to having B prosthetics) and was able to return home at a more independent level.    Follow Up Recommendations  CIR;Supervision/Assistance - 24 hour (will need SNF if unable to go to CIR)    Equipment Recommendations  None recommended by OT (well equipped)    Recommendations for Other Services Rehab consult    Precautions / Restrictions Precautions Precautions: Fall;Other (comment) Precaution Comments: B BKA with prosthetics in room now Restrictions Weight Bearing Restrictions: No       Mobility Bed Mobility Overal bed mobility: Needs Assistance Bed Mobility: Supine to Sit;Rolling Rolling: Modified independent (Device/Increase time)   Supine to sit: Max assist     General bed mobility comments: Max A from flat bed to sit EOB, use of bedrails but unable to get elbow under self  well enough to advance trunk. Practiced partial bed mobility again with pt able to demo going down on elbow and push self up  Transfers Overall transfer level: Needs assistance Equipment used: Rolling walker (2 wheeled) Transfers: Sit to/from Stand Sit to Stand: Mod assist;+2 physical assistance;+2 safety/equipment;From elevated surface         General transfer comment: Pt overall Mod A x 2 for sit to stand from elevated bed, increased time for power up and obtaining full standing position. Once up, pt able to demo mobility with decreased assist.    Balance Overall balance assessment: Needs assistance Sitting-balance support: Single extremity supported;Feet supported Sitting balance-Leahy Scale: Fair Sitting balance - Comments: supervision for reaching to don prosthetics   Standing balance support: Bilateral upper extremity supported Standing balance-Leahy Scale: Poor Standing balance comment: reliant on external/B UE support for dynamic tasks                           ADL either performed or assessed with clinical judgement   ADL Overall ADL's : Needs assistance/impaired                     Lower Body Dressing: Set up;Bed level Lower Body Dressing Details (indicate cue type and reason): Setup to don brief and shorts bed level. Pt reports recently sitting EOB/standing at home for this task, plan to progress standing balance for LB ADLs in standing     Toileting- Clothing Manipulation and Hygiene: Maximal assistance;Bed level Toileting - Clothing Manipulation  Details (indicate cue type and reason): Max A for peri care in bed (ensurance of cleanliness due to hx of bowel incontinence). Discussed pt's routine at home, reports wearing briefs and goes to bathroom to get cleaned up on toilet (use of wet wipes in bathroom) and reports able to manage this at home.     Functional mobility during ADLs: Minimal assistance;+2 for safety/equipment;Rolling walker (Min A for  turning, min guard for straight mobility once up (extensive assist for sit to stand)) General ADL Comments: Pt with improving endurance on this non-dialysis day. Pt contineus with difficulty in being able to get to sitting position EOB and standing position at bedside, but once upright - able to complete more tasks     Vision   Vision Assessment?: No apparent visual deficits   Perception     Praxis      Cognition Arousal/Alertness: Awake/alert Behavior During Therapy: WFL for tasks assessed/performed;Flat affect Overall Cognitive Status: Impaired/Different from baseline Area of Impairment: Safety/judgement;Problem solving;Attention                   Current Attention Level: Selective     Safety/Judgement: Decreased awareness of deficits   Problem Solving: Slow processing;Difficulty sequencing;Requires verbal cues General Comments: Pt pleasant and agreeable to attempts tasks. Slow processing and cues to stay on task but follows all cues/directions.        Exercises     Shoulder Instructions       General Comments Pt on RA, no SOB noted but reports of fatigue. DIscussed CIR with pt reporting having been there before and discharged at wheelchair level. Pt interested in CIR and aware of the work involved with therapies. Pt reports recent purchase of lift chair to assist at home    Pertinent Vitals/ Pain       Pain Assessment: No/denies pain  Home Living                                          Prior Functioning/Environment              Frequency  Min 2X/week        Progress Toward Goals  OT Goals(current goals can now be found in the care plan section)  Progress towards OT goals: Progressing toward goals  Acute Rehab OT Goals Patient Stated Goal: increase endurance, be able to go home safely OT Goal Formulation: With patient Time For Goal Achievement: 07/12/20 Potential to Achieve Goals: Good ADL Goals Pt Will Perform Grooming:  with min guard assist;standing Pt Will Perform Lower Body Bathing: sitting/lateral leans;sit to/from stand;with supervision Pt Will Perform Lower Body Dressing: with supervision;sitting/lateral leans;sit to/from stand Pt Will Transfer to Toilet: with supervision;ambulating Pt/caregiver will Perform Home Exercise Program: Increased strength;Both right and left upper extremity;With theraband;Independently;With written HEP provided Additional ADL Goal #1: Pt to demonstrate ability to monitor O2 and independently implement pursed lip breathing to maintain O2 > 88% Additional ADL Goal #2: Pt to demonstrate implementation of at least 2 energy conservation strategies during ADLs  Plan Discharge plan needs to be updated    Co-evaluation    PT/OT/SLP Co-Evaluation/Treatment: Yes Reason for Co-Treatment: For patient/therapist safety;Other (comment) (to further progress mobility, assess functional status for DC recs)   OT goals addressed during session: ADL's and self-care      AM-PAC OT "6 Clicks" Daily Activity     Outcome Measure  Help from another person eating meals?: None Help from another person taking care of personal grooming?: A Little Help from another person toileting, which includes using toliet, bedpan, or urinal?: A Lot Help from another person bathing (including washing, rinsing, drying)?: A Lot Help from another person to put on and taking off regular upper body clothing?: A Little Help from another person to put on and taking off regular lower body clothing?: A Little 6 Click Score: 17    End of Session Equipment Utilized During Treatment: Gait belt;Rolling walker  OT Visit Diagnosis: Other abnormalities of gait and mobility (R26.89);Muscle weakness (generalized) (M62.81)   Activity Tolerance Patient tolerated treatment well   Patient Left in chair;Other (comment) (with PT instructing pt in exercises in recliner)   Nurse Communication Mobility status;Other (comment) (DC  recs, plan to set pt up for grooming tasks seated at sink with NT/RN to check on pt and assist from sink once finished)        Time: 4360-6770 OT Time Calculation (min): 34 min  Charges: OT General Charges $OT Visit: 1 Visit OT Treatments $Self Care/Home Management : 8-22 mins  Layla Maw, OTR/L   Layla Maw 06/28/2020, 10:32 AM

## 2020-06-28 NOTE — Progress Notes (Signed)
Physical Therapy Treatment Patient Details Name: Paul [name] MRN: [id number] DOB: Dec 05, 1955 Today's Date: 06/28/2020    History of Present Illness 65 yo male presenting to ED via EMS after missing dialysis and weakness with COVID-19 +. Off COVID precautions on 2/3. PMH including HTN, DM2, renal transplant, CKD st IV, calciphylaxis, A-Fib, bil. TTAs, bilateral BKA with prosthesis,and TIA.    PT Comments    Patient received sitting up on side of bed, OT present. Patient donning prosthetics. He requires cues for initiation, continuation of tasks. Patient requires mod +2 assist for sit to stand from elevated bed. Cues for technique. Attempted to stand from low bed, but was unable. Patient requires increased time and effort to get fully upright. He is then able to ambulate 125 feet with RW and min guard. Chair to follow for safety. Patient with fair endurance. He performed LE strengthening exercises seated in recliner. Patient will continue to benefit from skilled PT while here to improve functional independence and safety.     Follow Up Recommendations  CIR     Equipment Recommendations  Other (comment) (TBD)    Recommendations for Other Services Rehab consult     Precautions / Restrictions Precautions Precautions: Fall;Other (comment) Precaution Comments: B BKA with prosthetics in room now, mod fall Restrictions Weight Bearing Restrictions: No    Mobility  Bed Mobility Overal bed mobility: Needs Assistance Bed Mobility: Supine to Sit;Rolling Rolling: Modified independent (Device/Increase time)   Supine to sit: Max assist Sit to supine: Supervision   General bed mobility comments: Max A from flat bed to sit EOB, use of bedrails but unable to get elbow under self well enough to advance trunk. Practiced partial bed mobility again with pt able to demo going down on elbow and push self up  Transfers Overall transfer level: Needs assistance Equipment used: Rolling walker (2  wheeled) Transfers: Sit to/from Stand Sit to Stand: Mod assist;+2 physical assistance;From elevated surface         General transfer comment: Pt overall Mod A x 2 for sit to stand from elevated bed, increased time for power up and obtaining full standing position. Once up, pt able to demo mobility with decreased assist.  Ambulation/Gait Ambulation/Gait assistance: Min guard;+2 safety/equipment Gait Distance (Feet): 125 Feet Assistive device: Rolling walker (2 wheeled) Gait Pattern/deviations: Step-through pattern;Decreased step length - right;Decreased step length - left;Decreased stance time - right;Decreased stance time - left;Decreased stride length;Trunk flexed Gait velocity: decreased   General Gait Details: generally safe with ambulation. Chair to follow this session.   Stairs             Wheelchair Mobility    Modified Rankin (Stroke Patients Only)       Balance Overall balance assessment: Needs assistance Sitting-balance support: Feet supported Sitting balance-Leahy Scale: Good Sitting balance - Comments: supervision for reaching to don prosthetics   Standing balance support: Bilateral upper extremity supported;During functional activity Standing balance-Leahy Scale: Fair Standing balance comment: reliant on external/B UE support for dynamic tasks                            Cognition Arousal/Alertness: Awake/alert Behavior During Therapy: WFL for tasks assessed/performed Overall Cognitive Status: No family/caregiver present to determine baseline cognitive functioning Area of Impairment: Problem solving                   Current Attention Level: Selective     Safety/Judgement: Decreased awareness of deficits  Problem Solving: Slow processing;Difficulty sequencing;Requires verbal cues General Comments: Pt pleasant and agreeable to attempts tasks. Slow processing and cues to stay on task but follows all cues/directions.       Exercises Other Exercises Other Exercises: Seated marching, LAQ x 10 reps B, hip abd/add and SLR x 10 reps bilaterally    General Comments General comments (skin integrity, edema, etc.): Pt on RA, no SOB noted but reports of fatigue. DIscussed CIR with pt reporting having been there before and discharged at wheelchair level. Pt interested in CIR and aware of the work involved with therapies. Pt reports recent purchase of lift chair to assist at home      Pertinent Vitals/Pain Pain Assessment: No/denies pain    Home Living                      Prior Function            PT Goals (current goals can now be found in the care plan section) Acute Rehab PT Goals Patient Stated Goal: increase endurance, be able to go home safely PT Goal Formulation: With patient Time For Goal Achievement: 07/11/20 Potential to Achieve Goals: Fair Progress towards PT goals: Progressing toward goals    Frequency    Min 2X/week      PT Plan Discharge plan needs to be updated    Co-evaluation   Reason for Co-Treatment: For patient/therapist safety;Other (comment) (to further progress mobility, assess functional status for DC recs)   OT goals addressed during session: ADL's and self-care      AM-PAC PT "6 Clicks" Mobility   Outcome Measure  Help needed turning from your back to your side while in a flat bed without using bedrails?: None Help needed moving from lying on your back to sitting on the side of a flat bed without using bedrails?: A Lot Help needed moving to and from a bed to a chair (including a wheelchair)?: A Little Help needed standing up from a chair using your arms (e.g., wheelchair or bedside chair)?: A Lot Help needed to walk in hospital room?: A Little Help needed climbing 3-5 steps with a railing? : A Lot 6 Click Score: 16    End of Session Equipment Utilized During Treatment: Gait belt Activity Tolerance: Patient tolerated treatment well;Patient limited by  fatigue Patient left: in chair Nurse Communication: Mobility status PT Visit Diagnosis: Muscle weakness (generalized) (M62.81);Other abnormalities of gait and mobility (R26.89)     Time: 6948-5462 PT Time Calculation (min) (ACUTE ONLY): 28 min  Charges:  $Gait Training: 8-22 mins $Therapeutic Exercise: 8-22 mins                     Ayani Ospina, PT, GCS 06/28/20,10:41 AM

## 2020-06-28 NOTE — Progress Notes (Signed)
Hypoglycemic Event  CBG: Results for Paul [name], Paul [name] (MRN [id number]) as of 06/28/2020 06:44  Ref. Range 06/28/2020 06:32  Glucose-Capillary Latest Ref Range: 70 - 99 mg/dL 46 (L)    Treatment: 4 oz juice/soda  Symptoms: None  Follow-up CBG: Time: 0650 CBG Result: 85   Possible Reasons for Event: Inadequate meal intake  Comments/MD notified:    Viviano Simas

## 2020-06-28 NOTE — Progress Notes (Signed)
Rehab Admissions Coordinator Note:  Patient was screened by Cleatrice Burke for appropriateness for an Inpatient Acute Rehab Consult per therapy change in recommendations. Previously at [facility] 01/2020 . Went home at level of lateral scoot transfers w/c to mat to right with supervision. Was not ambulatory per my review. If patient would like to be considered for CIR admit, Please place rehab consult.  Cleatrice Burke RN MSN 06/28/2020, 11:30 AM  I can be reached at [phone].

## 2020-06-28 NOTE — Progress Notes (Signed)
Inpatient Diabetes Program Recommendations  AACE/ADA: New Consensus Statement on Inpatient Glycemic Control (2015)  Target Ranges:  Prepandial:   less than 140 mg/dL      Peak postprandial:   less than 180 mg/dL (1-2 hours)      Critically ill patients:  140 - 180 mg/dL   Lab Results  Component Value Date   GLUCAP 85 06/28/2020   HGBA1C 7.5 (H) 06/12/2020    Review of Glycemic Control  Diabetes history:DM 2 Outpatient Diabetes medications: Basaglar 8 units daily, Humalog 2-3 units tid with meals Current orders for Inpatient glycemic control:  Lantus 7 units Daily Novolog 0-15 units tid + hs PO prednisone 5 mg Daily  Inpatient Diabetes Program Recommendations:    Note hypoglycemia this am. Basal insulin reduced.  -  May also need to consider reducing Novolog Correction scale due to renal function to prevent hypoglycemia through insulin stacking.  Thanks,  Tama Headings RN, MSN, BC-ADM Inpatient Diabetes Coordinator Team Pager [phone] (8a-5p)'

## 2020-06-28 NOTE — TOC Progression Note (Signed)
Transition of Care [facility]) - Progression Note    Patient Details  Name: Paul [name] MRN: [id number] Date of Birth: [birth year]  Transition of Care [facility]) CM/SW Contact  Paul Salina Mila Homer, LCSW Phone Number: 06/28/2020, 3:18 PM  Clinical Narrative: Talked with patient and wife at the bedside regarding SNF placement. They were advised that no bed offers to date, and one issue could be his [employer], as not all SNF's accept this insurance. Per patient/wife, he no longer has this insurance and they were advised that the appropriate department would need to be informed.   Call made to [facility], Development worker, community and CSW returned to room so that patient/wife could talk directly with her. Paul [name] was informed by Jenny Reichmann that she can call her and leave a voice mail providing the information from his insurance cards and wife was provided with Paul [name]'s phone number. Paul [name] was informed that once Christella Scheuermann is removed, his information will be sent out again.        Expected Discharge Plan: Skilled Nursing Facility Barriers to Discharge: Continued Medical Work up  Expected Discharge Plan and Services Expected Discharge Plan: [place] In-house Referral: NA Discharge Planning Services: CM Consult Post Acute Care Choice: [place] Living arrangements for the past 2 months: Single Family Home                 DME Arranged: N/A DME Agency: NA       HH Arranged: NA HH Agency: NA       Social Determinants of Health (SDOH) Interventions  No SDOH interventions requested or needed at this time  Readmission Risk Interventions Readmission Risk Prevention Plan 01/18/2020 04/26/2019 11/04/2018  Transportation Screening Complete Complete Complete  Medication Review Press photographer) Complete Complete Complete  PCP or Specialist appointment within 3-5 days of discharge Complete Not Complete Complete  PCP/Specialist Appt Not Complete comments - pending  medical stability -  [place] or Home Care Consult Complete Complete Complete  SW Recovery Care/Counseling Consult Complete Complete Complete  Palliative Care Screening Not Applicable Not Complete Not Applicable  Comments - may be appropriate, will ask MD -  [place] Not Applicable Patient Refused Not Applicable  Some recent data might be hidden

## 2020-06-28 NOTE — Progress Notes (Addendum)
[facility] PROGRESS NOTE    Progress Note  Paul [name]  VVO:[id number] DOB: [birth year] DOA: 06/11/2020 PCP: [name], MD     Brief Narrative:   Paul [name] is an 65 y.o. male past medical history of atrial fibrillation on Eliquis, end-stage renal disease with a failed transplant on dialysis Tuesday Thursdays and Saturdays, diabetes mellitus type 2, chronic diastolic heart failure comes into the ED secondary to dyspnea in the setting of missing dialysis found to be Covid positive with also Enterococcus bacteremia.  2D echo was done that showed severely reduced EF.  Patient is currently on waiting's CIR evaluation.  Is medically stable to be transferred  Significant Events: 06/11/2020 admission  Significant studies: 06/11/2020 chest x-ray showed diffuse bilateral infiltrates within the mid lower lungs small left pleural effusion. 06/12/2020 CT of the head showed no acute intracranial hemorrhage or infarct, there is a remote lacunar infarct to the left hemisphere. 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 06/13/2020 transthoracic echo showed an EF of 25% no evidence of vegetation.[name]  Antibiotics: None  Microbiology data: Blood culture:  Procedures: None  Assessment/Plan:   Acute respiratory failure with hypoxia likely due to fluid overload: Resolved with dialysis.  Sepsis secondarily to bacteremia due to Enterococcus Likely urinary source, 2D echo as above no evidence of vegetation. Discussed with ID recommended 2 weeks of IV antibiotics, he has completed his course of antibiotics in house.  COVID-19 pneumonia: Treated for 5 days weaned off steroids. He has completed his isolation.  Acute metabolic encephalopathy: Likely due to infectious etiology now resolved.  End-stage renal disease on hemodialysis: Further management per  renal.  Paroxysmal atrial fibrillation with RVR/tachybradycardia syndrome: Resolved continue metoprolol and Eliquis.  Acute on chronic systolic heart failure: With an EF of 25% cardiology was consulted recommended to continue hydralazine, Imdur. Beta-blockers were held due to bradycardia. Entresto held due to renal disease  Diabetes mellitus type II: Decrease long-acting insulin as blood glucose was low this morning, continue sliding scale insulin.  Essential hypertension: Continue metoprolol and Imdur.  Hyperlipidemia: Continue Lipitor.  Depression: Continue Zoloft  Dysphagia: Speech was consulted recommended regular consistency diet.  Stage II sacral decubitus ulcer present on admission RN Pressure Injury Documentation: Pressure Injury 01/18/20 Sacrum Stage 2 -  Partial thickness loss of dermis presenting as a shallow open injury with a red, pink wound bed without slough. (Active)  01/18/20 2251  Location: Sacrum  Location Orientation:   Staging: Stage 2 -  Partial thickness loss of dermis presenting as a shallow open injury with a red, pink wound bed without slough.  Wound Description (Comments):   Present on Admission: Yes    Estimated body mass index is 18.6 kg/m as calculated from the following:   Height as of this encounter: 6' (1.829 m).   Weight as of this encounter: 62.2 kg.   DVT prophylaxis: eliquis Family Communication:none Status is: Inpatient  Remains inpatient appropriate because:Hemodynamically unstable   Dispo:  Patient From: Home  Planned Disposition: [place]  Expected discharge date: 06/30/2020  Medically stable for discharge: Yes  Difficult to place patient: Yes       Code Status:     Code Status Orders  (From admission, onward)         Start     Ordered   06/11/20 1712  Full code  Continuous  06/11/20 1719        Code Status History    Date Active Date Inactive Code Status Order ID Comments User  Context   01/18/2020 2139 01/31/2020 1755 Full Code [id number]  Elizabeth Sauer Inpatient   01/18/2020 2139 01/18/2020 2139 Full Code [id number]  Elizabeth Sauer Inpatient   12/31/2019 1315 01/18/2020 2108 Full Code [id number]  Lequita Halt, MD ED   04/24/2019 0158 04/29/2019 1713 Full Code [id number]  Shela Leff, MD ED   10/28/2018 1748 11/04/2018 2111 Full Code [id number]  Luanne Bras, MD Inpatient   10/28/2018 1422 10/28/2018 1748 Full Code [id number]  Marliss Coots, PA-C ED   05/24/2018 0109 05/25/2018 1832 Full Code [id number]  Toy Baker, MD Inpatient   09/03/2013 1528 09/04/2013 1350 Full Code [id number]  Newt Minion, MD Inpatient   08/03/2013 1710 08/14/2013 1700 Full Code [id number]  Cathlyn Parsons, PA-C Inpatient   07/30/2013 1008 08/03/2013 1710 Full Code [id number]  Newt Minion, MD Inpatient   07/09/2013 1927 07/13/2013 2144 Full Code [id number]  Etta Quill, DO ED   05/31/2013 1602 06/11/2013 1703 Full Code [id number]  Cathlyn Parsons, PA-C Inpatient   05/24/2013 2116 05/31/2013 1602 Full Code [id number]  Theodis Blaze, MD ED   04/02/2013 1538 04/04/2013 1724 Full Code [id number]  Elmarie Shiley, MD Inpatient   Advance Care Planning Activity        IV Access:    Peripheral IV   Procedures and diagnostic studies:   CT ABDOMEN PELVIS WO CONTRAST  Result Date: 06/26/2020 CLINICAL DATA:  Abdominal mass, failed renal transplant, sepsis EXAM: CT ABDOMEN AND PELVIS WITHOUT CONTRAST TECHNIQUE: Multidetector CT imaging of the abdomen and pelvis was performed following the standard protocol without IV contrast. COMPARISON:  04/23/2019 FINDINGS: Lower chest: Moderate left pleural effusion, with dense left lower lobe consolidation likely atelectasis. Right chest is clear. Extensive coronary artery atherosclerosis. Hepatobiliary: Chronic pneumobilia related to prior choledocho jejunostomy. Gallbladder surgically absent. No focal liver abnormalities on this  limited unenhanced exam. Pancreas: Pancreatic head is likely surgically absent. Visualized portions of the body and tail are atrophic. Spleen: Normal in size without focal abnormality. Adrenals/Urinary Tract: The bilateral native kidneys are markedly atrophic. Nonobstructing calculi are seen within the native kidneys, measuring up to 10 mm in size. There is a transplant kidney in the right lower quadrant, with preserved cortical thickness. Punctate calcifications at the transplant hilum may be vascular or related to nonobstructing calculi. No hydronephrosis. The bladder is minimally distended without focal abnormality. Adrenals are normal. Stomach/Bowel: No bowel obstruction or ileus. No bowel wall thickening or inflammatory change. Postsurgical changes are seen from duodenal resection. Evaluation at the surgical bed is limited without IV contrast. Vascular/Lymphatic: Aortic atherosclerosis. No enlarged abdominal or pelvic lymph nodes. Reproductive: Prostate is unremarkable. Other: Small volume ascites is seen within the bilateral flanks. No free intraperitoneal gas. No abdominal wall hernia. Musculoskeletal: There are no acute or destructive bony lesions. Chronic L3 compression deformity again noted. Mild diffuse body wall edema. Reconstructed images demonstrate no additional findings. IMPRESSION: 1. Moderate left pleural effusion, with compressive left lower lobe atelectasis. 2. Small volume ascites. 3. Chronic renal failure, with unremarkable transplant kidney right lower quadrant. Multiple nonobstructing renal calculi as above. 4. Postsurgical changes from Whipple procedure. 5. Aortic Atherosclerosis (ICD10-I70.0). Coronary artery atherosclerosis. Electronically Signed   By: Randa Ngo M.D.   On: 06/26/2020 15:13     Medical Consultants:    None.  Subjective:    Paul [name] no new complaints  Objective:    Vitals:   06/27/20 1002 06/27/20 1642 06/27/20 2042 06/28/20 0426  BP: 131/72  (!) 124/59 123/69 (!) 154/76  Pulse:  (!) 58 (!) 56 (!) 53  Resp: 13 17 18 18   Temp:  98 F (36.7 C) 98.4 F (36.9 C) 98.7 F (37.1 C)  TempSrc:    Oral  SpO2:  97% 97% 96%  Weight:      Height:       SpO2: 96 % O2 Flow Rate (L/min): 1 L/min   Intake/Output Summary (Last 24 hours) at 06/28/2020 0859 Last data filed at 06/28/2020 0700 Gross per 24 hour  Intake 1200 ml  Output 1500 ml  Net -300 ml   Filed Weights   06/26/20 2146 06/27/20 0720 06/27/20 0956  Weight: 62 kg 63.6 kg 62.2 kg    Exam: General exam: In no acute distress. Respiratory system: Good air movement and clear to auscultation. Cardiovascular system: S1 & S2 heard, RRR. No JVD. Gastrointestinal system: Abdomen is nondistended, soft and nontender.  Extremities: No pedal edema. Skin: No rashes, lesions or ulcers Psychiatry: Judgement and insight appear normal. Mood & affect appropriate.  Data Reviewed:    Labs: Basic Metabolic Panel: Recent Labs  Lab 06/23/20 1432 06/24/20 1240 06/27/20 0736  NA 136 136 135  K 3.3* 3.1* 4.0  CL 104 103 104  CO2 19* 15* 16*  GLUCOSE 246* 253* 254*  BUN 68* 84* 84*  CREATININE 5.00* 5.49* 6.43*  CALCIUM 7.1* 7.1* 7.3*  PHOS 4.9* 5.5* 5.6*   GFR Estimated Creatinine Clearance: 9.9 mL/min (A) (by C-G formula based on SCr of 6.43 mg/dL (H)). Liver Function Tests: Recent Labs  Lab 06/23/20 1432 06/24/20 1240 06/27/20 0736  ALBUMIN 2.0* 1.9* 1.9*   No results for input(s): LIPASE, AMYLASE in the last 168 hours. No results for input(s): AMMONIA in the last 168 hours. Coagulation profile No results for input(s): INR, PROTIME in the last 168 hours. COVID-19 Labs  No results for input(s): DDIMER, FERRITIN, LDH, CRP in the last 72 hours.  Lab Results  Component Value Date   SARSCOV2NAA POSITIVE (A) 06/11/2020   SARSCOV2NAA NEGATIVE 12/31/2019   SARSCOV2NAA NEGATIVE 10/12/2019   [place] NEGATIVE 04/23/2019    CBC: Recent Labs  Lab 06/24/20 1240  06/27/20 0736  WBC 12.0* 12.6*  HGB 11.9* 11.5*  HCT 37.4* 38.6*  MCV 87.8 90.4  PLT 111* 90*   Cardiac Enzymes: No results for input(s): CKTOTAL, CKMB, CKMBINDEX, TROPONINI in the last 168 hours. BNP (last 3 results) No results for input(s): PROBNP in the last 8760 hours. CBG: Recent Labs  Lab 06/27/20 1143 06/27/20 1642 06/27/20 2042 06/28/20 0632 06/28/20 0651  GLUCAP 233* 279* 175* 46* 85   D-Dimer: No results for input(s): DDIMER in the last 72 hours. Hgb A1c: No results for input(s): HGBA1C in the last 72 hours. Lipid Profile: No results for input(s): CHOL, HDL, LDLCALC, TRIG, CHOLHDL, LDLDIRECT in the last 72 hours. Thyroid function studies: No results for input(s): TSH, T4TOTAL, T3FREE, THYROIDAB in the last 72 hours.  Invalid input(s): FREET3 Anemia work up: No results for input(s): VITAMINB12, FOLATE, FERRITIN, TIBC, IRON, RETICCTPCT in the last 72 hours. Sepsis Labs: Recent Labs  Lab 06/24/20 1240 06/27/20 0736  WBC 12.0* 12.6*   Microbiology No results found for this or any previous visit (from the past 240 hour(s)).   Medications:   . apixaban  5 mg Oral BID  .  aspirin  81 mg Oral Daily  . atorvastatin  10 mg Oral Q M,W,F  . Chlorhexidine Gluconate Cloth  6 each Topical Q0600  . hydrALAZINE  10 mg Oral TID  . insulin aspart  0-15 Units Subcutaneous TID WC  . insulin aspart  0-5 Units Subcutaneous QHS  . insulin glargine  10 Units Subcutaneous Daily  . isosorbide mononitrate  15 mg Oral Daily  . lipase/protease/amylase  12,000 Units Oral TID AC  . magnesium chloride  2 tablet Oral BID  . metoprolol succinate  25 mg Oral Daily  . pantoprazole  80 mg Oral Daily  . predniSONE  5 mg Oral Q breakfast  . sertraline  50 mg Oral QHS  . sevelamer carbonate  800 mg Oral TID WC  . sodium chloride flush  3 mL Intravenous Q12H  . tacrolimus  2 mg Oral BID  . tamsulosin  0.4 mg Oral QPC supper   Continuous Infusions: . sodium chloride        LOS:  17 days   [facility]  06/28/2020, 8:59 AM

## 2020-06-28 NOTE — Progress Notes (Signed)
Subjective: Tolerated dialysis yesterday sitting  up in bedside chair no complaints, noted for possible CIR transfer  Objective Vital signs in last 24 hours: Vitals:   06/27/20 1642 06/27/20 2042 06/28/20 0426 06/28/20 1042  BP: (!) 124/59 123/69 (!) 154/76 (!) 142/68  Pulse: (!) 58 (!) 56 (!) 53 (!) 56  Resp: 17 18 18 18   Temp: 98 F (36.7 C) 98.4 F (36.9 C) 98.7 F (37.1 C) 98.7 F (37.1 C)  TempSrc:   Oral Oral  SpO2: 97% 97% 96% 99%  Weight:      Height:       Weight change: 1.568 kg   Physical Exam General:Alert, chronically ill appearing male in NAD Heart:RRR, +2/9 systolic murmur Lungs:CTAB, nml WOB Abdomen:Bowel sounds normoactive, soft, ND NT Extremities:no LE edema, b/l BKA Dialysis Access:RU AVF + bruit   Dialysis Orders: TTS NW 3.5h 66kh 3K/2.25 bath RFA AVF Hep none - hect 2 ug - mircera 200 q2 , last 1/13  Problem/Plan: 1. COVID-19 pneumonia:Now off isolation, Rx per admitting team.First positive test was 06/12/19 .s/p remdesivir and solumedrol. ON prednisone currently. 2. ESRD:Usual TTS schedule. Dialysis treatments are shortened here due to high inpatient census and staffingshortage 3. Hypokalemia:K+4.0 pre HD , using increase K bath 4. Enterococcus bacteremia: ID recommended 2 weeks of IV abx, last dose 2/7.Echo negative for vegetations 5. Tachybradycardia syndrome/A-fib RVR:On metoprolol and eliquis.  6. A/C combined CHF: LVEF 25-30% here, G2DD.On aspirin, lipitor, hydralazine, imdur andToprol with mild bradycardia. 7. Hypoxemia:nml WOBonRA now,no SOB.admission + IS opacities by CXR. 8. Hypertension/volume:BPwell controlled.Well under EDW. Will need lower EDW atd/c - weights 62-63kg. 9. Anemia:Hgb11.5, no ESA for now. 10. Metabolic bone disease:CorrCa ok, Phos5.6 . Resumed  home binders (Renvela). 11. T1DM: Insulin per primary 12. Disposition - pt can return to his outpatient HD as of 06/27/20 at [facility]  but will need to wear an N95 mask for another 7 days on treatment. Awaiting SNF placement   Ernest Haber, PA-C [facility] Beeper [phone] 06/28/2020,12:10 PM  LOS: 17 days   Labs: Basic Metabolic Panel: Recent Labs  Lab 06/23/20 1432 06/24/20 1240 06/27/20 0736  NA 136 136 135  K 3.3* 3.1* 4.0  CL 104 103 104  CO2 19* 15* 16*  GLUCOSE 246* 253* 254*  BUN 68* 84* 84*  CREATININE 5.00* 5.49* 6.43*  CALCIUM 7.1* 7.1* 7.3*  PHOS 4.9* 5.5* 5.6*   Liver Function Tests: Recent Labs  Lab 06/23/20 1432 06/24/20 1240 06/27/20 0736  ALBUMIN 2.0* 1.9* 1.9*   No results for input(s): LIPASE, AMYLASE in the last 168 hours. No results for input(s): AMMONIA in the last 168 hours. CBC: Recent Labs  Lab 06/24/20 1240 06/27/20 0736  WBC 12.0* 12.6*  HGB 11.9* 11.5*  HCT 37.4* 38.6*  MCV 87.8 90.4  PLT 111* 90*   Cardiac Enzymes: No results for input(s): CKTOTAL, CKMB, CKMBINDEX, TROPONINI in the last 168 hours. CBG: Recent Labs  Lab 06/27/20 1642 06/27/20 2042 06/28/20 0632 06/28/20 0651 06/28/20 1130  GLUCAP 279* 175* 46* 85 156*    Medications: . sodium chloride     . apixaban  5 mg Oral BID  . aspirin  81 mg Oral Daily  . atorvastatin  10 mg Oral Q M,W,F  . Chlorhexidine Gluconate Cloth  6 each Topical Q0600  . hydrALAZINE  10 mg Oral TID  . insulin aspart  0-15 Units Subcutaneous TID WC  . insulin aspart  0-5 Units Subcutaneous QHS  . [START ON 06/29/2020] insulin  glargine  7 Units Subcutaneous Daily  . isosorbide mononitrate  15 mg Oral Daily  . lipase/protease/amylase  12,000 Units Oral TID AC  . magnesium chloride  2 tablet Oral BID  . metoprolol succinate  25 mg Oral Daily  . pantoprazole  80 mg Oral Daily  . predniSONE  5 mg Oral Q breakfast  . sertraline  50 mg Oral QHS  . sevelamer carbonate  800 mg Oral TID WC  . sodium chloride flush  3 mL Intravenous Q12H  . tacrolimus  2 mg Oral BID  . tamsulosin  0.4 mg Oral QPC supper

## 2020-06-29 DIAGNOSIS — R7881 Bacteremia: Secondary | ICD-10-CM | POA: Diagnosis not present

## 2020-06-29 DIAGNOSIS — I5031 Acute diastolic (congestive) heart failure: Secondary | ICD-10-CM | POA: Diagnosis not present

## 2020-06-29 DIAGNOSIS — U071 COVID-19: Secondary | ICD-10-CM | POA: Diagnosis not present

## 2020-06-29 DIAGNOSIS — N186 End stage renal disease: Secondary | ICD-10-CM | POA: Diagnosis not present

## 2020-06-29 LAB — CBC
HCT: 32.8 % — ABNORMAL LOW (ref 39.0–52.0)
Hemoglobin: 10.4 g/dL — ABNORMAL LOW (ref 13.0–17.0)
MCH: 28.6 pg (ref 26.0–34.0)
MCHC: 31.7 g/dL (ref 30.0–36.0)
MCV: 90.1 fL (ref 80.0–100.0)
Platelets: 85 10*3/uL — ABNORMAL LOW (ref 150–400)
RBC: 3.64 MIL/uL — ABNORMAL LOW (ref 4.22–5.81)
RDW: 20.6 % — ABNORMAL HIGH (ref 11.5–15.5)
WBC: 10.7 10*3/uL — ABNORMAL HIGH (ref 4.0–10.5)
nRBC: 0 % (ref 0.0–0.2)

## 2020-06-29 LAB — RENAL FUNCTION PANEL
Albumin: 1.9 g/dL — ABNORMAL LOW (ref 3.5–5.0)
Anion gap: 13 (ref 5–15)
BUN: 77 mg/dL — ABNORMAL HIGH (ref 8–23)
CO2: 20 mmol/L — ABNORMAL LOW (ref 22–32)
Calcium: 7.7 mg/dL — ABNORMAL LOW (ref 8.9–10.3)
Chloride: 104 mmol/L (ref 98–111)
Creatinine, Ser: 5.66 mg/dL — ABNORMAL HIGH (ref 0.61–1.24)
GFR, Estimated: 10 mL/min — ABNORMAL LOW (ref >60)
Glucose, Bld: 120 mg/dL — ABNORMAL HIGH (ref 70–99)
Phosphorus: 5.8 mg/dL — ABNORMAL HIGH (ref 2.5–4.6)
Potassium: 4.2 mmol/L (ref 3.5–5.1)
Sodium: 137 mmol/L (ref 135–145)

## 2020-06-29 LAB — GLUCOSE, CAPILLARY
Glucose-Capillary: 125 mg/dL — ABNORMAL HIGH (ref 70–99)
Glucose-Capillary: 208 mg/dL — ABNORMAL HIGH (ref 70–99)
Glucose-Capillary: 123 mg/dL — ABNORMAL HIGH (ref 70–99)
Glucose-Capillary: 119 mg/dL — ABNORMAL HIGH (ref 70–99)

## 2020-06-29 MED ORDER — DOXERCALCIFEROL 4 MCG/2ML IV SOLN
2.0000 ug | INTRAVENOUS | Status: DC
  Administered 2020-07-01 – 2020-07-15 (×6): 2 ug via INTRAVENOUS
  Filled 2020-06-29 (×10): qty 2

## 2020-06-29 NOTE — TOC Progression Note (Signed)
Transition of Care [facility]) - Progression Note    Patient Details  Name: Paul [name] MRN: [id number] Date of Birth: [birth year]  [facility] CM/SW Contact  Sharlet Salina Mila Homer, LCSW Phone Number: 06/29/2020, 2:01 PM  Clinical Narrative: Talked with wife (1:54 pm) regarding contacting [place] financial counselor to provide her with updated insurance information: Medicare and BCBS Supplement. Wife is aware that inpatient rehab is involved and she is in agreement with this plan if they can take him. Wife informed that Christella Scheuermann definitely needs to come off of his records here at [facility] as inpatient rehab would be contacting Christella Scheuermann if this continues to show as his primary insurance. Mrs. Hoak informed CSW that she would go ahead and call Jenny Reichmann.   CSW will continue to follow  Expected Discharge Plan: [place] Barriers to Discharge: Continued Medical Work up  Expected Discharge Plan and Services Expected Discharge Plan: [place] In-house Referral: NA Discharge Planning Services: CM Consult Post Acute Care Choice: [place] Living arrangements for the past 2 months: Single Family Home                 DME Arranged: N/A DME Agency: NA       HH Arranged: NA HH Agency: NA       Social Determinants of Health (SDOH) Interventions  No SDOH interventions requested or needed at this time.  Readmission Risk Interventions Readmission Risk Prevention Plan 01/18/2020 04/26/2019 11/04/2018  Transportation Screening Complete Complete Complete  Medication Review Press photographer) Complete Complete Complete  PCP or Specialist appointment within 3-5 days of discharge Complete Not Complete Complete  PCP/Specialist Appt Not Complete comments - pending medical stability -  [place] or Home Care Consult Complete Complete Complete  SW Recovery Care/Counseling Consult Complete Complete Complete  Palliative Care Screening Not Applicable Not Complete  Not Applicable  Comments - may be appropriate, will ask MD -  [place] Not Applicable Patient Refused Not Applicable  Some recent data might be hidden

## 2020-06-29 NOTE — Plan of Care (Signed)
  Problem: Health Behavior/Discharge Planning: Goal: Ability to manage health-related needs will improve Outcome: Progressing   Problem: Activity: Goal: Risk for activity intolerance will decrease Outcome: Progressing   

## 2020-06-29 NOTE — Progress Notes (Signed)
Inpatient Rehabilitation Admissions Coordinator  CIR bed is not available for this patient to admit this week, therefore I have not begun insurance approval with CIR. I have alerted Lorriane Shire , SW, that other rehab venues need to be sought.  Danne Baxter, RN, MSN Rehab Admissions Coordinator [phone] 06/29/2020 4:08 PM

## 2020-06-29 NOTE — Progress Notes (Signed)
Subjective: Seen on dialysis no complaints tolerating UF.  Awaiting CIR versus SNF  Objective Vital signs in last 24 hours: Vitals:   06/29/20 0720 06/29/20 0725 06/29/20 0735 06/29/20 1010  BP:  (!) 164/69 (!) 168/68   Pulse:  (!) 57  61  Resp:  15 14   Temp:  98.2 F (36.8 C)  98.2 F (36.8 C)  TempSrc:  Oral  Oral  SpO2:  98%  97%  Weight: 66.4 kg   64 kg  Height:       Weight change:    Physical Exam General:Alert, chronically ill appearing male in NAD Heart:RRR, +2/9 systolic murmur Lungs:CTAB, nml WOB Abdomen:Bowel sounds normoactive, soft, ND NT Extremities:no LE edema, b/l BKA Dialysis Access:RU AVFpatent on HD   Dialysis Orders: TTS NW 3.5h 66kh 3K/2.25 bath RFA AVF Hep none - hect 2 ug - mircera 200 q2 , last 1/13  Problem/Plan: 1. COVID-19 pneumonia:Now off isolation, Rx per admitting team.First positive test was 06/12/19 .s/p remdesivir and solumedrol. ON prednisone currently. 2. ESRD:Usual TTS schedule. Dialysis treatments are shortened here due to high inpatient census and staffingshortage 3. Hypokalemia:K+4.2pre HD , using increase K bath 4. Enterococcus bacteremia: ID recommended 2 weeks of IV abx, last dose 2/7.Echo negative for vegetations 5. Tachybradycardia syndrome/A-fib RVR:On metoprolol and eliquis.  A/C combined CHF: LVEF 25-30% here, G2DD.On aspirin, lipitor, hydralazine, imdur andToprol with mild bradycardia. 6. Hypoxemia:nml WOBonRA now,no SOB.admission + IS opacities by CXR. 7. Hypertension/volume:BPwell controlled.Well under EDW. Will need lower EDW atd/c - weights 62-63kg. 8. Anemia:Hgb10.4 no ESA for now. 9. Metabolic bone disease:CorrCa ok, Phos5.8 . Resumed  home binders (Renvela).  Hectorol continue 10. T1DM: Insulin per primary 11. Disposition - pt can return to his outpatient HD as of 06/27/20 at [facility] but will need to wear an N95 mask for another 7 days on treatment. Awaiting SNF  placement versus CIR   Ernest Haber, PA-C [facility] Beeper [phone] 06/29/2020,10:25 AM  LOS: 18 days   Labs: Basic Metabolic Panel: Recent Labs  Lab 06/24/20 1240 06/27/20 0736 06/29/20 0816  NA 136 135 137  K 3.1* 4.0 4.2  CL 103 104 104  CO2 15* 16* 20*  GLUCOSE 253* 254* 120*  BUN 84* 84* 77*  CREATININE 5.49* 6.43* 5.66*  CALCIUM 7.1* 7.3* 7.7*  PHOS 5.5* 5.6* 5.8*   Liver Function Tests: Recent Labs  Lab 06/24/20 1240 06/27/20 0736 06/29/20 0816  ALBUMIN 1.9* 1.9* 1.9*   No results for input(s): LIPASE, AMYLASE in the last 168 hours. No results for input(s): AMMONIA in the last 168 hours. CBC: Recent Labs  Lab 06/24/20 1240 06/27/20 0736 06/29/20 0817  WBC 12.0* 12.6* 10.7*  HGB 11.9* 11.5* 10.4*  HCT 37.4* 38.6* 32.8*  MCV 87.8 90.4 90.1  PLT 111* 90* 85*   Cardiac Enzymes: No results for input(s): CKTOTAL, CKMB, CKMBINDEX, TROPONINI in the last 168 hours. CBG: Recent Labs  Lab 06/28/20 0651 06/28/20 1130 06/28/20 1657 06/28/20 2123 06/29/20 0651  GLUCAP 85 156* 88 150* 125*    Studies/Results: No results found. Medications: . sodium chloride     . apixaban  5 mg Oral BID  . aspirin  81 mg Oral Daily  . atorvastatin  10 mg Oral Q M,W,F  . Chlorhexidine Gluconate Cloth  6 each Topical Q0600  . hydrALAZINE  10 mg Oral TID  . insulin aspart  0-15 Units Subcutaneous TID WC  . insulin aspart  0-5 Units Subcutaneous QHS  . insulin glargine  7 Units Subcutaneous Daily  . isosorbide mononitrate  15 mg Oral Daily  . lipase/protease/amylase  12,000 Units Oral TID AC  . magnesium chloride  2 tablet Oral BID  . metoprolol succinate  25 mg Oral Daily  . pantoprazole  80 mg Oral Daily  . predniSONE  5 mg Oral Q breakfast  . sertraline  50 mg Oral QHS  . sevelamer carbonate  800 mg Oral TID WC  . sodium chloride flush  3 mL Intravenous Q12H  . tacrolimus  2 mg Oral BID  . tamsulosin  0.4 mg Oral QPC supper

## 2020-06-29 NOTE — Progress Notes (Addendum)
At 2236pm RN notified by tele that pt HR sustained 135 SVT. RN assessed pt. Pt stated, " I feel palpitations." RN took vitals and paged MD on call. RN notified charge nurse. At 2243pm Pt HR went to 60 with no intervention. Pt stated, " I feel much better." RN awaiting call back from MD. Pt stable at this time. RN will continue to monitor.

## 2020-06-29 NOTE — Progress Notes (Signed)
Patient off the floor in hemodialysis.

## 2020-06-30 DIAGNOSIS — U071 COVID-19: Secondary | ICD-10-CM | POA: Diagnosis not present

## 2020-06-30 DIAGNOSIS — I5031 Acute diastolic (congestive) heart failure: Secondary | ICD-10-CM | POA: Diagnosis not present

## 2020-06-30 DIAGNOSIS — N186 End stage renal disease: Secondary | ICD-10-CM | POA: Diagnosis not present

## 2020-06-30 DIAGNOSIS — R7881 Bacteremia: Secondary | ICD-10-CM | POA: Diagnosis not present

## 2020-06-30 LAB — GLUCOSE, CAPILLARY
Glucose-Capillary: 74 mg/dL (ref 70–99)
Glucose-Capillary: 130 mg/dL — ABNORMAL HIGH (ref 70–99)
Glucose-Capillary: 118 mg/dL — ABNORMAL HIGH (ref 70–99)
Glucose-Capillary: 131 mg/dL — ABNORMAL HIGH (ref 70–99)

## 2020-06-30 MED ORDER — SODIUM BICARBONATE 650 MG PO TABS
1950.0000 mg | ORAL_TABLET | Freq: Four times a day (QID) | ORAL | Status: DC
  Administered 2020-06-30 – 2020-07-08 (×31): 1950 mg via ORAL
  Filled 2020-06-30 (×34): qty 3

## 2020-06-30 MED ORDER — CHLORHEXIDINE GLUCONATE CLOTH 2 % EX PADS
6.0000 | MEDICATED_PAD | Freq: Every day | CUTANEOUS | Status: DC
  Administered 2020-07-08: 6 via TOPICAL

## 2020-06-30 NOTE — Progress Notes (Signed)
Occupational Therapy Treatment Patient Details Name: SHEILA GERVASI MRN: [id number] DOB: [birth year] Today's Date: 06/30/2020    History of present illness 65 yo male presenting to ED via EMS after missing dialysis and weakness with COVID-19 +. Off COVID precautions on 2/3. PMH including HTN, DM2, renal transplant, CKD st IV, calciphylaxis, A-Fib, bil. TTAs, bilateral BKA with prosthesis,and TIA.   OT comments  Pt progressing well towards OT goals, able to progress LB ADLs in standing to Min A. Pt continues to require either B UE support in standing or external support from therapist to maintain standing balance though progressing well. Pt able to demo bed mobility to sit up and then stand up at bedside with Mod A. Improved problem solving with techniques to decrease level of assist. Pt remains motivated to improve functional abilities and maximize endurance. Continue to believe pt would benefit from CIR to progress safety with ADLs/mobility, as well as to decrease fall risk. Plan to progress mobility, toilet transfers and dynamic standing balance during ADLs in next sessions.   Follow Up Recommendations  CIR;Supervision/Assistance - 24 hour    Equipment Recommendations  None recommended by OT (well equipped)    Recommendations for Other Services Rehab consult    Precautions / Restrictions Precautions Precautions: Fall;Other (comment) Precaution Comments: B BKA with prosthetics in room now, mod fall Restrictions Weight Bearing Restrictions: No       Mobility Bed Mobility Overal bed mobility: Needs Assistance Bed Mobility: Rolling;Sidelying to Sit Rolling: Modified independent (Device/Increase time) Sidelying to sit: Mod assist       General bed mobility comments: Mod A for power up with trunk with placement of elbow further back, close to progressing to Min A. Performed x 2 during LB ADLs.  Transfers Overall transfer level: Needs assistance Equipment used: Rolling walker (2  wheeled) Transfers: Sit to/from Stand Sit to Stand: Mod assist         General transfer comment: Mod A for power up from bed, cues for bringing B feet back and pt able to push from bed. Able to clear hips, continued difficulty/increased time to get fully upright but progressing. Completed x 3 with pt requiring increased assist when fatigued. Able to mobilize with RW around bedside in room to recliner at min guard    Balance Overall balance assessment: Needs assistance Sitting-balance support: Feet supported Sitting balance-Leahy Scale: Good     Standing balance support: During functional activity;Bilateral upper extremity supported;Single extremity supported Standing balance-Leahy Scale: Poor Standing balance comment: reliant on B UE support in standing or Min A from therapist when unsupported                           ADL either performed or assessed with clinical judgement   ADL Overall ADL's : Needs assistance/impaired     Grooming: Set up;Sitting;Wash/dry face;Brushing hair Grooming Details (indicate cue type and reason): Setup for ADLs seated at sink on OT exit Upper Body Bathing: Set up;Sitting   Lower Body Bathing: Minimal assistance;Sitting/lateral leans Lower Body Bathing Details (indicate cue type and reason): Min A for thoroughness of bathing peri region with lateral leans, no bowel incontinence noted. Pt able to bathe front bed level     Lower Body Dressing: Minimal assistance;Sit to/from stand Lower Body Dressing Details (indicate cue type and reason): Able to don brief/pants above knees sitting EOB and B prosthetics. Min A in standing for steadying while donning pants over waist without UE support  Functional mobility during ADLs: Min guard;Rolling walker General ADL Comments: Pt progressing to perform LB ADLs in standing but continues to require hands on assist to maintain balance due to deficits. Improving endurance     Vision    Vision Assessment?: No apparent visual deficits   Perception     Praxis      Cognition Arousal/Alertness: Awake/alert Behavior During Therapy: WFL for tasks assessed/performed Overall Cognitive Status: No family/caregiver present to determine baseline cognitive functioning Area of Impairment: Problem solving                             Problem Solving: Slow processing;Requires verbal cues General Comments: Pt with improved affect, smiling and joking during session today. Still requires some cues for problem solving but improving and motivated to collaborate on ADL routine at home        Exercises     Shoulder Instructions       General Comments VSS on RA. Improved affect, collaborated with pt on rehab recommendations and pt wife assisting in correcting insurance information    Pertinent Vitals/ Pain       Pain Assessment: No/denies pain  Home Living                                          Prior Functioning/Environment              Frequency  Min 2X/week        Progress Toward Goals  OT Goals(current goals can now be found in the care plan section)  Progress towards OT goals: Progressing toward goals  Acute Rehab OT Goals Patient Stated Goal: increase endurance, be able to go home safely OT Goal Formulation: With patient Time For Goal Achievement: 07/12/20 Potential to Achieve Goals: Good ADL Goals Pt Will Perform Grooming: with min guard assist;standing Pt Will Perform Lower Body Bathing: sitting/lateral leans;sit to/from stand;with supervision Pt Will Perform Lower Body Dressing: with supervision;sitting/lateral leans;sit to/from stand Pt Will Transfer to Toilet: with supervision;ambulating Pt/caregiver will Perform Home Exercise Program: Increased strength;Both right and left upper extremity;With theraband;Independently;With written HEP provided  Plan Discharge plan remains appropriate    Co-evaluation                  AM-PAC OT "6 Clicks" Daily Activity     Outcome Measure   Help from another person eating meals?: None Help from another person taking care of personal grooming?: A Little Help from another person toileting, which includes using toliet, bedpan, or urinal?: A Lot Help from another person bathing (including washing, rinsing, drying)?: A Little Help from another person to put on and taking off regular upper body clothing?: A Little Help from another person to put on and taking off regular lower body clothing?: A Little 6 Click Score: 18    End of Session Equipment Utilized During Treatment: Gait belt;Rolling walker  OT Visit Diagnosis: Other abnormalities of gait and mobility (R26.89);Muscle weakness (generalized) (M62.81)   Activity Tolerance Patient tolerated treatment well   Patient Left in chair;Other (comment) (seated at sink for grooming tasks)   Nurse Communication Mobility status;Other (comment) (NT/RN aware pt seated at sink performing grooming tasks)        Time: 9381-8299 OT Time Calculation (min): 27 min  Charges: OT General Charges $OT Visit: 1 Visit OT Treatments $Self Care/Home  Management : 23-37 mins  Layla Maw, OTR/L   Layla Maw 06/30/2020, 10:02 AM

## 2020-06-30 NOTE — Progress Notes (Signed)
Physical Therapy Treatment Patient Details Name: KYRON SCHLITT MRN: [id number] DOB: [birth year] Today's Date: 06/30/2020    History of Present Illness 65 yo male presenting to ED via EMS after missing dialysis and weakness with COVID-19 +. Off COVID precautions on 2/3. PMH including HTN, DM2, renal transplant, CKD st IV, calciphylaxis, A-Fib, bil. TTAs, bilateral BKA with prosthesis,and TIA.    PT Comments    Pt received in chair, cooperative and pleasant. Mod Ax1 for power up into standing. Min guard for ambulation ~60 ft with RW and chair follow. Unable to progress distance due to generalized fatigue. Completed exercises at edge of chair to address abdominal engagement and LE strengthening. Pt demonstrated increased awareness and insight into deficits. Pt able to identify moments of unsteadiness during ambulation and when he can no longer ambulate safely. But overall pt motivated to continue PT to address impairments. Pt left in chair with all needs met, call bell within reach, and RN aware of status.    Follow Up Recommendations  CIR     Equipment Recommendations  Rolling walker with 5" wheels;3in1 (PT);Wheelchair (measurements PT);Wheelchair cushion (measurements PT) (TBD)    Recommendations for Other Services Rehab consult     Precautions / Restrictions Precautions Precautions: Fall;Other (comment) Precaution Comments: B BKA with prosthetics in room now, mod fall Restrictions Weight Bearing Restrictions: No    Mobility  Bed Mobility Overal bed mobility: Needs Assistance Bed Mobility: Rolling;Sidelying to Sit Rolling: Modified independent (Device/Increase time) Sidelying to sit: Mod assist       General bed mobility comments: Pt received in chair    Transfers Overall transfer level: Needs assistance Equipment used: Rolling walker (2 wheeled) Transfers: Sit to/from Stand Sit to Stand: Mod assist         General transfer comment: Mod A for power up into standing  from chair. Pt able to push up from chair.Verbal cueing to bring hips forwards. Tactile cueing to bring chest and trunk upright and prevent flexed posture  Ambulation/Gait Ambulation/Gait assistance: Min guard;+2 safety/equipment (Min guard x1 and chair follow) Gait Distance (Feet): 60 Feet Assistive device: Rolling walker (2 wheeled) Gait Pattern/deviations: Step-through pattern;Decreased step length - right;Decreased step length - left;Decreased stance time - right;Decreased stance time - left;Decreased stride length;Trunk flexed Gait velocity: decreased   General Gait Details: Chair follow   Stairs             Wheelchair Mobility    Modified Rankin (Stroke Patients Only)       Balance Overall balance assessment: Needs assistance Sitting-balance support: Feet supported Sitting balance-Leahy Scale: Good     Standing balance support: During functional activity;Bilateral upper extremity supported Standing balance-Leahy Scale: Poor Standing balance comment: Needed B UE support in standing                            Cognition Arousal/Alertness: Awake/alert Behavior During Therapy: WFL for tasks assessed/performed Overall Cognitive Status: No family/caregiver present to determine baseline cognitive functioning Area of Impairment: Problem solving                             Problem Solving: Slow processing;Requires verbal cues General Comments: Pt in good mood today, aware of deficits, noticed when he had moment of unsteadiness during ambulation and when he was too tired to continue ambulating safely. Verbal cueing needed occassionally for problem solving, but follows single and multi-step commands consistently  Exercises Other Exercises Other Exercises: Alternating seated marching 2x20, LAQ 1x10 B, seated hip abd x10 B (Verbal cueing to sit upright without back against chair to promote abdominal engagement)    General Comments General  comments (skin integrity, edema, etc.): VSS on RA      Pertinent Vitals/Pain Pain Assessment: No/denies pain    Home Living                      Prior Function            PT Goals (current goals can now be found in the care plan section) Acute Rehab PT Goals Patient Stated Goal: increase endurance, be able to go home safely PT Goal Formulation: With patient Time For Goal Achievement: 07/11/20 Potential to Achieve Goals: Fair Progress towards PT goals: Progressing toward goals    Frequency    Min 2X/week      PT Plan Current plan remains appropriate    Co-evaluation              AM-PAC PT "6 Clicks" Mobility   Outcome Measure  Help needed turning from your back to your side while in a flat bed without using bedrails?: None Help needed moving from lying on your back to sitting on the side of a flat bed without using bedrails?: A Lot Help needed moving to and from a bed to a chair (including a wheelchair)?: A Little Help needed standing up from a chair using your arms (e.g., wheelchair or bedside chair)?: A Lot Help needed to walk in hospital room?: A Little Help needed climbing 3-5 steps with a railing? : A Lot 6 Click Score: 16    End of Session Equipment Utilized During Treatment: Gait belt Activity Tolerance: Patient tolerated treatment well;Patient limited by fatigue Patient left: in chair;with call bell/phone within reach Nurse Communication: Mobility status PT Visit Diagnosis: Muscle weakness (generalized) (M62.81);Other abnormalities of gait and mobility (R26.89)     Time:  -     Charges:                        Rosita Kea, SPT

## 2020-06-30 NOTE — Progress Notes (Signed)
[facility] Progress Note   Subjective:   Pt seen in room, reports feeling well. Occasional dizziness when walking around/working with therapy. Denies SOB, CP, abdominal pain, and nausea. SNF pending.   Objective Vitals:   06/30/20 0445 06/30/20 0445 06/30/20 0814 06/30/20 0951  BP:  (!) 155/69 (!) 131/55 127/76  Pulse:  (!) 53 (!) 52 60  Resp:    18  Temp:  97.9 F (36.6 C)  97.9 F (36.6 C)  TempSrc:  Oral    SpO2:  98%  98%  Weight: 64.7 kg     Height:       Physical Exam General: Well developed male, alert and in NAD Heart: RRR, no murmurs, rubs or gallops Lungs: CTA bilaterally, no wheezing/rhonchi/rales. On RA Abdomen: Soft, non-tender, non-distended, +BS Extremities: B/l BKA, no edema Dialysis Access: RUE AVF +t/b  Additional Objective Labs: Basic Metabolic Panel: Recent Labs  Lab 06/24/20 1240 06/27/20 0736 06/29/20 0816  NA 136 135 137  K 3.1* 4.0 4.2  CL 103 104 104  CO2 15* 16* 20*  GLUCOSE 253* 254* 120*  BUN 84* 84* 77*  CREATININE 5.49* 6.43* 5.66*  CALCIUM 7.1* 7.3* 7.7*  PHOS 5.5* 5.6* 5.8*   Liver Function Tests: Recent Labs  Lab 06/24/20 1240 06/27/20 0736 06/29/20 0816  ALBUMIN 1.9* 1.9* 1.9*   No results for input(s): LIPASE, AMYLASE in the last 168 hours. CBC: Recent Labs  Lab 06/24/20 1240 06/27/20 0736 06/29/20 0817  WBC 12.0* 12.6* 10.7*  HGB 11.9* 11.5* 10.4*  HCT 37.4* 38.6* 32.8*  MCV 87.8 90.4 90.1  PLT 111* 90* 85*   Blood Culture    Component Value Date/Time   SDES BLOOD LEFT HAND 06/13/2020 0803   SDES BLOOD LEFT HAND 06/13/2020 0803   SPECREQUEST  06/13/2020 0803    BOTTLES DRAWN AEROBIC AND ANAEROBIC Blood Culture adequate volume   SPECREQUEST  06/13/2020 0803    BOTTLES DRAWN AEROBIC AND ANAEROBIC Blood Culture adequate volume   CULT  06/13/2020 0803    NO GROWTH 5 DAYS Performed at [facility] [address] [place], [place] [address]    CULT  06/13/2020 0803    NO GROWTH 5  DAYS Performed at [facility] [address], [place], [place] [address]    REPTSTATUS 06/18/2020 FINAL 06/13/2020 0803   REPTSTATUS 06/18/2020 FINAL 06/13/2020 0803    Cardiac Enzymes: No results for input(s): CKTOTAL, CKMB, CKMBINDEX, TROPONINI in the last 168 hours. CBG: Recent Labs  Lab 06/29/20 0651 06/29/20 1102 06/29/20 1638 06/29/20 2045 06/30/20 0701  GLUCAP 125* 208* 123* 119* 74   Iron Studies: No results for input(s): IRON, TIBC, TRANSFERRIN, FERRITIN in the last 72 hours. @lablastinr3 @ Studies/Results: No results found. Medications: . sodium chloride     . apixaban  5 mg Oral BID  . aspirin  81 mg Oral Daily  . atorvastatin  10 mg Oral Q M,W,F  . Chlorhexidine Gluconate Cloth  6 each Topical Q0600  . doxercalciferol  2 mcg Intravenous Q T,Th,Sa-HD  . hydrALAZINE  10 mg Oral TID  . insulin aspart  0-15 Units Subcutaneous TID WC  . insulin aspart  0-5 Units Subcutaneous QHS  . insulin glargine  7 Units Subcutaneous Daily  . isosorbide mononitrate  15 mg Oral Daily  . lipase/protease/amylase  12,000 Units Oral TID AC  . magnesium chloride  2 tablet Oral BID  . metoprolol succinate  25 mg Oral Daily  . pantoprazole  80 mg  Oral Daily  . predniSONE  5 mg Oral Q breakfast  . sertraline  50 mg Oral QHS  . sevelamer carbonate  800 mg Oral TID WC  . sodium bicarbonate  1,950 mg Oral QID  . sodium chloride flush  3 mL Intravenous Q12H  . tacrolimus  2 mg Oral BID  . tamsulosin  0.4 mg Oral QPC supper    Dialysis Orders:  TTS NW 3.5h 66kh 3K/2.25 bath RFA AVF Hep none - hect 2 ug - mircera 200 q2 , last 1/13  Assessment/Plan:  1. COVID-19 pneumonia:Now off isolation, Rxper admitting team.First positive test was 06/12/19 .s/p remdesivir and solumedrol. 2. ESRD:Continue TTS schedule. Dialysis treatments are shortened here due to high inpatient census and staffingshortage 3. Hypokalemia: Improved,K+4.2pre HD yesterday, using 4K  bath 4. Enterococcus bacteremia: ID recommended 2 weeks of IV abx, last dose 2/7.Echo negative for vegetations 5. Tachybradycardia syndrome/A-fib RVR:On metoprolol and eliquis.  A/C combined CHF: LVEF 25-30% here, G2DD.On aspirin, lipitor, hydralazine, imdur andToprol with mild bradycardia. 6. Hypoxemia:nml WOBonRA now,no SOB.[name] 7. Hypertension/volume:BPwell controlled.Well under EDW. Will need lower EDW atd/c 8. Anemia:Hgb10.4, no ESA indicated at this time 9. Metabolic bone disease:CorrCa ok, Phos5.8. Resumedhome binders (Renvela).  Hectorol continue 10. T1DM: Insulin per primary 11. Disposition - pt can return to his outpatient HD as of 06/27/20 at [facility] but will need to wear an N95 mask for another 4 more days on treatment. Awaiting SNF placement versus CIR   Anice Paganini, PA-C 06/30/2020, 11:16 AM  [facility] Pager: [phone]

## 2020-06-30 NOTE — Progress Notes (Addendum)
[facility] PROGRESS NOTE    Progress Note  Paul [name]             GQQ:[id number] DOB: 05-08-1956 DOA: 06/11/2020 PCP: [name], MD            Brief Narrative:   Paul [name] is an 65 y.o. male past medical history of atrial fibrillation on Eliquis, end-stage renal disease with a failed transplant on dialysis Tuesday Thursdays and Saturdays, diabetes mellitus type 2, chronic diastolic heart failure comes into the ED secondary to dyspnea in the setting of missing dialysis found to be Covid positive with also Enterococcus bacteremia.  2D echo was done that showed severely reduced EF.  Patient is currently on waiting's CIR evaluation.  Is medically stable to be transferred  Significant Events: 06/11/2020 admission  Significant studies: 06/11/2020 chest x-ray showed diffuse bilateral infiltrates within the mid lower lungs small left pleural effusion. 06/12/2020 CT of the head showed no acute intracranial hemorrhage or infarct, there is a remote lacunar infarct to the left hemisphere. 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 06/13/2020 transthoracic echo showed an EF of 25% no evidence of vegetation.Paul [name]  Antibiotics: None  Microbiology data: Blood culture:  Procedures: None  Assessment/Plan:   Acute respiratory failure with hypoxia likely due to fluid overload: Resolved with dialysis.  Sepsis secondarily to bacteremia due to Enterococcus Likely urinary source, 2D echo as above no evidence of vegetation. He has completed his course of antibiotics in house.  COVID-19 pneumonia: Treated for 5 days weaned off steroids. He has completed his isolation.  Acute metabolic encephalopathy: Likely due to infectious etiology now resolved.  End-stage renal disease on hemodialysis: Further management per renal.  Paroxysmal  atrial fibrillation with RVR/tachybradycardia syndrome: Resolved continue metoprolol and Eliquis.  Acute on chronic systolic heart failure: With an EF of 25% cardiology was consulted recommended to continue hydralazine, Imdur. Beta-blockers were held due to bradycardia. Entresto held due to renal disease  Diabetes mellitus type II: Decrease long-acting insulin as blood glucose was low this morning, continue sliding scale insulin.  Essential hypertension: Continue metoprolol and Imdur.  Hyperlipidemia: Continue Lipitor.  Depression: Continue Zoloft  Dysphagia: Speech was consulted recommended regular consistency diet.  Stage II sacral decubitus ulcer present on admission RN Pressure Injury Documentation: Pressure Injury 01/18/20 Sacrum Stage 2 -  Partial thickness loss of dermis presenting as a shallow open injury with a red, pink wound bed without slough. (Active)  01/18/20 2251  Location: Sacrum  Location Orientation:   Staging: Stage 2 -  Partial thickness loss of dermis presenting as a shallow open injury with a red, pink wound bed without slough.  Wound Description (Comments):   Present on Admission: Yes    Estimated body mass index is 18.6 kg/m as calculated from the following:   Height as of this encounter: 6' (1.829 m).   Weight as of this encounter: 62.2 kg.   DVT prophylaxis: eliquis Family Communication:none Status is: Inpatient  Remains inpatient appropriate because:Hemodynamically unstable   Dispo:             Patient From: Home             Planned Disposition: [place]             Expected discharge date: 06/30/2020             Medically stable for discharge: Yes  Difficult to place patient: Yes       Code Status:     Code Status Orders  (From admission, onward)                                    Start     Ordered    06/11/20 1712  Full code  Continuous        06/11/20 1719                              Code Status History    Date Active Date Inactive Code Status Order ID Comments User Context   01/18/2020 2139 01/31/2020 1755 Full Code [id number]  Paul [name] Inpatient   01/18/2020 2139 01/18/2020 2139 Full Code [id number]  Paul [name] Inpatient   12/31/2019 1315 01/18/2020 2108 Full Code [id number]  Paul Halt, MD ED   04/24/2019 0158 04/29/2019 1713 Full Code [id number]  Paul Leff, MD ED   10/28/2018 1748 11/04/2018 2111 Full Code [id number]  Paul Bras, MD Inpatient   10/28/2018 1422 10/28/2018 1748 Full Code [id number]  Paul Coots, PA-C ED   05/24/2018 0109 05/25/2018 1832 Full Code [id number]  Paul Baker, MD Inpatient   09/03/2013 1528 09/04/2013 1350 Full Code [id number]  Paul Minion, MD Inpatient   08/03/2013 1710 08/14/2013 1700 Full Code [id number]  Paul Parsons, PA-C Inpatient   07/30/2013 1008 08/03/2013 1710 Full Code [id number]  Paul Minion, MD Inpatient   07/09/2013 1927 07/13/2013 2144 Full Code [id number]  Paul Quill, DO ED   05/31/2013 1602 06/11/2013 1703 Full Code [id number]  Paul Parsons, PA-C Inpatient   05/24/2013 2116 05/31/2013 1602 Full Code [id number]  Paul Blaze, MD ED   04/02/2013 1538 04/04/2013 1724 Full Code [id number]  Paul Shiley, MD Inpatient   Advance Care Planning Activity        IV Access:    Peripheral IV   Procedures and diagnostic studies:    Imaging Results (Last 48 hours)  CT ABDOMEN PELVIS WO CONTRAST  Result Date: 06/26/2020 CLINICAL DATA:  Abdominal mass, failed renal transplant, sepsis EXAM: CT ABDOMEN AND PELVIS WITHOUT CONTRAST TECHNIQUE: Multidetector CT imaging of the abdomen and pelvis was performed following the standard protocol without IV contrast. COMPARISON:  04/23/2019 FINDINGS: Lower chest: Moderate left pleural effusion, with dense left lower lobe consolidation likely atelectasis. Right chest is clear.  Extensive coronary artery atherosclerosis. Hepatobiliary: Chronic pneumobilia related to prior choledocho jejunostomy. Gallbladder surgically absent. No focal liver abnormalities on this limited unenhanced exam. Pancreas: Pancreatic head is likely surgically absent. Visualized portions of the body and tail are atrophic. Spleen: Normal in size without focal abnormality. Adrenals/Urinary Tract: The bilateral native kidneys are markedly atrophic. Nonobstructing calculi are seen within the native kidneys, measuring up to 10 mm in size. There is a transplant kidney in the right lower quadrant, with preserved cortical thickness. Punctate calcifications at the transplant hilum may be vascular or related to nonobstructing calculi. No hydronephrosis. The bladder is minimally distended without focal abnormality. Adrenals are normal. Stomach/Bowel: No bowel obstruction or ileus. No bowel wall thickening or inflammatory change. Postsurgical changes are seen from duodenal resection. Evaluation at the surgical bed is limited without IV contrast. Vascular/Lymphatic: Aortic atherosclerosis. No enlarged abdominal or pelvic lymph nodes. Reproductive: Prostate is unremarkable. Other: Small volume ascites is seen  within the bilateral flanks. No free intraperitoneal gas. No abdominal wall hernia. Musculoskeletal: There are no acute or destructive bony lesions. Chronic L3 compression deformity again noted. Mild diffuse body wall edema. Reconstructed images demonstrate no additional findings. IMPRESSION: 1. Moderate left pleural effusion, with compressive left lower lobe atelectasis. 2. Small volume ascites. 3. Chronic renal failure, with unremarkable transplant kidney right lower quadrant. Multiple nonobstructing renal calculi as above. 4. Postsurgical changes from Whipple procedure. 5. Aortic Atherosclerosis (ICD10-I70.0). Coronary artery atherosclerosis. Electronically Signed   By: Randa Ngo M.D.   On: 06/26/2020 15:13       Medical Consultants:    None.   Subjective:    KENICHI CASSADA no new complaints  Objective:          Vitals:   06/27/20 1002 06/27/20 1642 06/27/20 2042 06/28/20 0426  BP: 131/72 (!) 124/59 123/69 (!) 154/76  Pulse:  (!) 58 (!) 56 (!) 53  Resp: 13 17 18 18   Temp:  98 F (36.7 C) 98.4 F (36.9 C) 98.7 F (37.1 C)  TempSrc:    Oral  SpO2:  97% 97% 96%  Weight:      Height:       SpO2: 96 % O2 Flow Rate (L/min): 1 L/min   Intake/Output Summary (Last 24 hours) at 06/28/2020 0859 Last data filed at 06/28/2020 0700    Gross per 24 hour  Intake 1200 ml  Output 1500 ml  Net -300 ml        Filed Weights   06/26/20 2146 06/27/20 0720 06/27/20 0956  Weight: 62 kg 63.6 kg 62.2 kg    Exam: General exam: In no acute distress. Respiratory system: Good air movement and clear to auscultation. Cardiovascular system: S1 & S2 heard, RRR. No JVD. Gastrointestinal system: Abdomen is nondistended, soft and nontender.  Extremities: No pedal edema. Skin: No rashes, lesions or ulcers Psychiatry: Judgement and insight appear normal. Mood & affect appropriate.  Data Reviewed:    Labs: Basic Metabolic Panel: Last Labs        Recent Labs  Lab 06/23/20 1432 06/24/20 1240 06/27/20 0736  NA 136 136 135  K 3.3* 3.1* 4.0  CL 104 103 104  CO2 19* 15* 16*  GLUCOSE 246* 253* 254*  BUN 68* 84* 84*  CREATININE 5.00* 5.49* 6.43*  CALCIUM 7.1* 7.1* 7.3*  PHOS 4.9* 5.5* 5.6*     GFR Estimated Creatinine Clearance: 9.9 mL/min (A) (by C-G formula based on SCr of 6.43 mg/dL (H)). Liver Function Tests: Last Labs        Recent Labs  Lab 06/23/20 1432 06/24/20 1240 06/27/20 0736  ALBUMIN 2.0* 1.9* 1.9*     Last Labs   No results for input(s): LIPASE, AMYLASE in the last 168 hours.   Last Labs   No results for input(s): AMMONIA in the last 168 hours.   Coagulation profile Last Labs   No results for input(s): INR, PROTIME in  the last 168 hours.   COVID-19 Labs  Recent Labs (last 2 labs)   No results for input(s): DDIMER, FERRITIN, LDH, CRP in the last 72 hours.    Recent Labs       Lab Results  Component Value Date   SARSCOV2NAA POSITIVE (A) 06/11/2020   SARSCOV2NAA NEGATIVE 12/31/2019   [place] NEGATIVE 10/12/2019   [place] NEGATIVE 04/23/2019      CBC: Last Labs       Recent Labs  Lab 06/24/20 1240 06/27/20 0736  WBC 12.0* 12.6*  HGB 11.9* 11.5*  HCT 37.4* 38.6*  MCV 87.8 90.4  PLT 111* 90*     Cardiac Enzymes: Last Labs   No results for input(s): CKTOTAL, CKMB, CKMBINDEX, TROPONINI in the last 168 hours.   BNP (last 3 results) Recent Labs (within last 365 days)  No results for input(s): PROBNP in the last 8760 hours.   CBG: Last Labs          Recent Labs  Lab 06/27/20 1143 06/27/20 1642 06/27/20 2042 06/28/20 0632 06/28/20 0651  GLUCAP 233* 279* 175* 46* 85     D-Dimer: Recent Labs (last 2 labs)   No results for input(s): DDIMER in the last 72 hours.   Hgb A1c: Recent Labs (last 2 labs)   No results for input(s): HGBA1C in the last 72 hours.   Lipid Profile: Recent Labs (last 2 labs)   No results for input(s): CHOL, HDL, LDLCALC, TRIG, CHOLHDL, LDLDIRECT in the last 72 hours.   Thyroid function studies:  Recent Labs (last 2 labs)   No results for input(s): TSH, T4TOTAL, T3FREE, THYROIDAB in the last 72 hours.  Invalid input(s): FREET3   Anemia work up: [employer] (last 2 labs)   No results for input(s): VITAMINB12, FOLATE, FERRITIN, TIBC, IRON, RETICCTPCT in the last 72 hours.   Sepsis Labs: Last Labs       Recent Labs  Lab 06/24/20 1240 06/27/20 0736  WBC 12.0* 12.6*     Microbiology No results found for this or any previous visit (from the past 240 hour(s)).   Medications:   . apixaban  5 mg Oral BID  . aspirin  81 mg Oral Daily  . atorvastatin  10 mg Oral Q M,W,F  . Chlorhexidine Gluconate Cloth  6 each Topical  Q0600  . hydrALAZINE  10 mg Oral TID  . insulin aspart  0-15 Units Subcutaneous TID WC  . insulin aspart  0-5 Units Subcutaneous QHS  . insulin glargine  10 Units Subcutaneous Daily  . isosorbide mononitrate  15 mg Oral Daily  . lipase/protease/amylase  12,000 Units Oral TID AC  . magnesium chloride  2 tablet Oral BID  . metoprolol succinate  25 mg Oral Daily  . pantoprazole  80 mg Oral Daily  . predniSONE  5 mg Oral Q breakfast  . sertraline  50 mg Oral QHS  . sevelamer carbonate  800 mg Oral TID WC  . sodium chloride flush  3 mL Intravenous Q12H  . tacrolimus  2 mg Oral BID  . tamsulosin  0.4 mg Oral QPC supper   Continuous Infusions: . sodium chloride        LOS: 17 days   Charlynne Cousins   [facility]  06/28/2020, 8:59 AM             Revision History                        Note Details  Author Charlynne Cousins, MD File Time 06/29/2020 10:12 AM  Author Type Physician Status Addendum  Last Editor Charlynne Cousins, MD Service [place] # [id number] Admit Date 06/11/2020

## 2020-06-30 NOTE — Progress Notes (Signed)
[facility] PROGRESS NOTE    Progress Note  Paul [name]  JJK:[id number] DOB: [birth year] DOA: 06/11/2020 PCP: [name], MD     Brief Narrative:   Paul [name] is an 65 y.o. male past medical history of atrial fibrillation on Eliquis, end-stage renal disease with a failed transplant on dialysis Tuesday Thursdays and Saturdays, diabetes mellitus type 2, chronic diastolic heart failure comes into the ED secondary to dyspnea in the setting of missing dialysis found to be Covid positive with also Enterococcus bacteremia.  2D echo was done that showed severely reduced EF.  Is medically stable to be transferred to facility  Significant Events: 06/11/2020 admission  Significant studies: 06/11/2020 chest x-ray showed diffuse bilateral infiltrates within the mid lower lungs small left pleural effusion. 06/12/2020 CT of the head showed no acute intracranial hemorrhage or infarct, there is a remote lacunar infarct to the left hemisphere. 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 06/13/2020 transthoracic echo showed an EF of 25% no evidence of vegetation.[name]  Antibiotics: None  Microbiology data: Blood culture:  Procedures: None  Assessment/Plan:   Acute respiratory failure with hypoxia likely due to fluid overload: Resolved with dialysis.  Sepsis secondarily to bacteremia due to Enterococcus Likely urinary source, 2D echo as above no evidence of vegetation. Discussed with ID recommended 2 weeks of IV antibiotics, he has completed his course of antibiotics in house.  COVID-19 pneumonia: Treated for 5 days weaned off steroids. He has completed his isolation.  Acute metabolic encephalopathy: Likely due to infectious etiology now resolved.  End-stage renal disease on hemodialysis: Further management per renal.  Paroxysmal atrial fibrillation  with RVR/tachybradycardia syndrome: Resolved continue metoprolol and Eliquis.  Acute on chronic systolic heart failure: With an EF of 25% cardiology was consulted recommended to continue hydralazine, Imdur. Beta-blockers were held due to bradycardia. Entresto held due to renal disease  Diabetes mellitus type II: Decrease long-acting insulin as blood glucose was low this morning, continue sliding scale insulin.  Essential hypertension: Well-controlled continue metoprolol and Imdur.  Hyperlipidemia: Continue Lipitor.  Depression: Continue Zoloft  Dysphagia: Speech was consulted recommended regular consistency diet.  Stage II sacral decubitus ulcer present on admission RN Pressure Injury Documentation: Pressure Injury 01/18/20 Sacrum Stage 2 -  Partial thickness loss of dermis presenting as a shallow open injury with a red, pink wound bed without slough. (Active)  01/18/20 2251  Location: Sacrum  Location Orientation:   Staging: Stage 2 -  Partial thickness loss of dermis presenting as a shallow open injury with a red, pink wound bed without slough.  Wound Description (Comments):   Present on Admission: Yes    Estimated body mass index is 19.35 kg/m as calculated from the following:   Height as of this encounter: 6' (1.829 m).   Weight as of this encounter: 64.7 kg.   DVT prophylaxis: eliquis Family Communication:none Status is: Inpatient  Remains inpatient appropriate because:Hemodynamically unstable   Dispo:  Patient From: Home  Planned Disposition: [place]  Expected discharge date: 07/01/2020  Medically stable for discharge: Yes  Difficult to place patient: Yes       Code Status:     Code Status Orders  (From admission, onward)         Start     Ordered   06/11/20 1712  Full code  Continuous        06/11/20 1719  Code Status History    Date Active Date Inactive Code Status Order ID Comments User Context   01/18/2020 2139  01/31/2020 1755 Full Code [id number]  Elizabeth Sauer Inpatient   01/18/2020 2139 01/18/2020 2139 Full Code [id number]  Elizabeth Sauer Inpatient   12/31/2019 1315 01/18/2020 2108 Full Code [id number]  Lequita Halt, MD ED   04/24/2019 0158 04/29/2019 1713 Full Code [id number]  Shela Leff, MD ED   10/28/2018 1748 11/04/2018 2111 Full Code [id number]  Luanne Bras, MD Inpatient   10/28/2018 1422 10/28/2018 1748 Full Code [id number]  Marliss Coots, PA-C ED   05/24/2018 0109 05/25/2018 1832 Full Code [id number]  Toy Baker, MD Inpatient   09/03/2013 1528 09/04/2013 1350 Full Code [id number]  Newt Minion, MD Inpatient   08/03/2013 1710 08/14/2013 1700 Full Code [id number]  Cathlyn Parsons, PA-C Inpatient   07/30/2013 1008 08/03/2013 1710 Full Code [id number]  Newt Minion, MD Inpatient   07/09/2013 1927 07/13/2013 2144 Full Code [id number]  Etta Quill, DO ED   05/31/2013 1602 06/11/2013 1703 Full Code [id number]  Cathlyn Parsons, PA-C Inpatient   05/24/2013 2116 05/31/2013 1602 Full Code [id number]  Theodis Blaze, MD ED   04/02/2013 1538 04/04/2013 1724 Full Code [id number]  Elmarie Shiley, MD Inpatient   Advance Care Planning Activity        IV Access:    Peripheral IV   Procedures and diagnostic studies:   No results found.   Medical Consultants:    None.   Subjective:    Paul [name] no new complaints  Objective:    Vitals:   06/29/20 2242 06/30/20 0445 06/30/20 0445 06/30/20 0814  BP: 123/72  (!) 155/69 (!) 131/55  Pulse: 63  (!) 53 (!) 52  Resp:      Temp:   97.9 F (36.6 C)   TempSrc:   Oral   SpO2:   98%   Weight:  64.7 kg    Height:       SpO2: 98 % O2 Flow Rate (L/min): 1 L/min   Intake/Output Summary (Last 24 hours) at 06/30/2020 1006 Last data filed at 06/30/2020 6759 Gross per 24 hour  Intake 580 ml  Output 2000 ml  Net -1420 ml   Filed Weights   06/29/20 0720 06/29/20 1010 06/30/20 0445  Weight: 66.4 kg 64 kg 64.7 kg     Exam: General exam: In no acute distress. Respiratory system: Good air movement and clear to auscultation. Cardiovascular system: S1 & S2 heard, RRR. No JVD. Gastrointestinal system: Abdomen is nondistended, soft and nontender.  Extremities: No pedal edema. Skin: No rashes, lesions or ulcers Psychiatry: Judgement and insight appear normal. Mood & affect appropriate.  Data Reviewed:    Labs: Basic Metabolic Panel: Recent Labs  Lab 06/23/20 1432 06/24/20 1240 06/27/20 0736 06/29/20 0816  NA 136 136 135 137  K 3.3* 3.1* 4.0 4.2  CL 104 103 104 104  CO2 19* 15* 16* 20*  GLUCOSE 246* 253* 254* 120*  BUN 68* 84* 84* 77*  CREATININE 5.00* 5.49* 6.43* 5.66*  CALCIUM 7.1* 7.1* 7.3* 7.7*  PHOS 4.9* 5.5* 5.6* 5.8*   GFR Estimated Creatinine Clearance: 11.7 mL/min (A) (by C-G formula based on SCr of 5.66 mg/dL (H)). Liver Function Tests: Recent Labs  Lab 06/23/20 1432 06/24/20 1240 06/27/20 0736 06/29/20 0816  ALBUMIN 2.0* 1.9* 1.9* 1.9*   No results for input(s): LIPASE, AMYLASE in the last 168 hours. No results  for input(s): AMMONIA in the last 168 hours. Coagulation profile No results for input(s): INR, PROTIME in the last 168 hours. COVID-19 Labs  No results for input(s): DDIMER, FERRITIN, LDH, CRP in the last 72 hours.  Lab Results  Component Value Date   SARSCOV2NAA POSITIVE (A) 06/11/2020   SARSCOV2NAA NEGATIVE 12/31/2019   [place] NEGATIVE 10/12/2019   [place] NEGATIVE 04/23/2019    CBC: Recent Labs  Lab 06/24/20 1240 06/27/20 0736 06/29/20 0817  WBC 12.0* 12.6* 10.7*  HGB 11.9* 11.5* 10.4*  HCT 37.4* 38.6* 32.8*  MCV 87.8 90.4 90.1  PLT 111* 90* 85*   Cardiac Enzymes: No results for input(s): CKTOTAL, CKMB, CKMBINDEX, TROPONINI in the last 168 hours. BNP (last 3 results) No results for input(s): PROBNP in the last 8760 hours. CBG: Recent Labs  Lab 06/29/20 0651 06/29/20 1102 06/29/20 1638 06/29/20 2045 06/30/20 0701  GLUCAP  125* 208* 123* 119* 74   D-Dimer: No results for input(s): DDIMER in the last 72 hours. Hgb A1c: No results for input(s): HGBA1C in the last 72 hours. Lipid Profile: No results for input(s): CHOL, HDL, LDLCALC, TRIG, CHOLHDL, LDLDIRECT in the last 72 hours. Thyroid function studies: No results for input(s): TSH, T4TOTAL, T3FREE, THYROIDAB in the last 72 hours.  Invalid input(s): FREET3 Anemia work up: No results for input(s): VITAMINB12, FOLATE, FERRITIN, TIBC, IRON, RETICCTPCT in the last 72 hours. Sepsis Labs: Recent Labs  Lab 06/24/20 1240 06/27/20 0736 06/29/20 0817  WBC 12.0* 12.6* 10.7*   Microbiology No results found for this or any previous visit (from the past 240 hour(s)).   Medications:   . apixaban  5 mg Oral BID  . aspirin  81 mg Oral Daily  . atorvastatin  10 mg Oral Q M,W,F  . Chlorhexidine Gluconate Cloth  6 each Topical Q0600  . doxercalciferol  2 mcg Intravenous Q T,Th,Sa-HD  . hydrALAZINE  10 mg Oral TID  . insulin aspart  0-15 Units Subcutaneous TID WC  . insulin aspart  0-5 Units Subcutaneous QHS  . insulin glargine  7 Units Subcutaneous Daily  . isosorbide mononitrate  15 mg Oral Daily  . lipase/protease/amylase  12,000 Units Oral TID AC  . magnesium chloride  2 tablet Oral BID  . metoprolol succinate  25 mg Oral Daily  . pantoprazole  80 mg Oral Daily  . predniSONE  5 mg Oral Q breakfast  . sertraline  50 mg Oral QHS  . sevelamer carbonate  800 mg Oral TID WC  . sodium chloride flush  3 mL Intravenous Q12H  . tacrolimus  2 mg Oral BID  . tamsulosin  0.4 mg Oral QPC supper   Continuous Infusions: . sodium chloride        LOS: 19 days   Charlynne Cousins  [facility]  06/30/2020, 10:06 AM

## 2020-06-30 NOTE — TOC Progression Note (Addendum)
Transition of Care [facility]) - Progression Note    Patient Details  Name: Paul [name] MRN: [id number] Date of Birth: 09-20-1955  Transition of Care [facility]) CM/SW Contact  Sharlet Salina Mila Homer, LCSW Phone Number: 06/30/2020, 5:13 PM  Clinical Narrative:   CSW checked patient's face sheet and Christella Scheuermann is still showing. Call made to Mrs. Cordial and was informed that she talked to [place] Public house manager) yesterday and provided her the information regarding her husband's Medicare and [place], so that the Christella Scheuermann could be removed..   CSW is aware that CIR is following and does not have a bed. CSW will f/u with financial counselor on Monday regarding the removal of [employer] from patient's information, as this may assist in finding placement, especially if CIR continues to not have a bed for patient.       Expected Discharge Plan: Skilled Nursing Facility Barriers to Discharge: Continued Medical Work up  Expected Discharge Plan and Services Expected Discharge Plan: [place] In-house Referral: NA Discharge Planning Services: CM Consult Post Acute Care Choice: [place] Living arrangements for the past 2 months: Single Family Home                 DME Arranged: N/A DME Agency: NA       HH Arranged: NA HH Agency: NA         Social Determinants of Health (SDOH) Interventions    Readmission Risk Interventions Readmission Risk Prevention Plan 01/18/2020 04/26/2019 11/04/2018  Transportation Screening Complete Complete Complete  Medication Review Press photographer) Complete Complete Complete  PCP or Specialist appointment within 3-5 days of discharge Complete Not Complete Complete  PCP/Specialist Appt Not Complete comments - pending medical stability -  [place] or Home Care Consult Complete Complete Complete  SW Recovery Care/Counseling Consult Complete Complete Complete  Palliative Care Screening Not Applicable Not Complete Not Applicable  Comments - may  be appropriate, will ask MD -  [place] Not Applicable Patient Refused Not Applicable  Some recent data might be hidden

## 2020-07-01 DIAGNOSIS — U071 COVID-19: Secondary | ICD-10-CM | POA: Diagnosis not present

## 2020-07-01 DIAGNOSIS — R7881 Bacteremia: Secondary | ICD-10-CM | POA: Diagnosis not present

## 2020-07-01 DIAGNOSIS — N186 End stage renal disease: Secondary | ICD-10-CM | POA: Diagnosis not present

## 2020-07-01 DIAGNOSIS — I5031 Acute diastolic (congestive) heart failure: Secondary | ICD-10-CM | POA: Diagnosis not present

## 2020-07-01 LAB — RENAL FUNCTION PANEL
Albumin: 2 g/dL — ABNORMAL LOW (ref 3.5–5.0)
Anion gap: 14 (ref 5–15)
BUN: 87 mg/dL — ABNORMAL HIGH (ref 8–23)
CO2: 21 mmol/L — ABNORMAL LOW (ref 22–32)
Calcium: 7.7 mg/dL — ABNORMAL LOW (ref 8.9–10.3)
Chloride: 103 mmol/L (ref 98–111)
Creatinine, Ser: 5.32 mg/dL — ABNORMAL HIGH (ref 0.61–1.24)
GFR, Estimated: 11 mL/min — ABNORMAL LOW (ref >60)
Glucose, Bld: 77 mg/dL (ref 70–99)
Phosphorus: 6 mg/dL — ABNORMAL HIGH (ref 2.5–4.6)
Potassium: 4.4 mmol/L (ref 3.5–5.1)
Sodium: 138 mmol/L (ref 135–145)

## 2020-07-01 LAB — CBC
HCT: 33.7 % — ABNORMAL LOW (ref 39.0–52.0)
Hemoglobin: 10.2 g/dL — ABNORMAL LOW (ref 13.0–17.0)
MCH: 27.6 pg (ref 26.0–34.0)
MCHC: 30.3 g/dL (ref 30.0–36.0)
MCV: 91.1 fL (ref 80.0–100.0)
Platelets: 73 10*3/uL — ABNORMAL LOW (ref 150–400)
RBC: 3.7 MIL/uL — ABNORMAL LOW (ref 4.22–5.81)
RDW: 20.1 % — ABNORMAL HIGH (ref 11.5–15.5)
WBC: 9.5 10*3/uL (ref 4.0–10.5)
nRBC: 0 % (ref 0.0–0.2)

## 2020-07-01 LAB — GLUCOSE, CAPILLARY
Glucose-Capillary: 75 mg/dL (ref 70–99)
Glucose-Capillary: 125 mg/dL — ABNORMAL HIGH (ref 70–99)
Glucose-Capillary: 128 mg/dL — ABNORMAL HIGH (ref 70–99)
Glucose-Capillary: 236 mg/dL — ABNORMAL HIGH (ref 70–99)
Glucose-Capillary: 128 mg/dL — ABNORMAL HIGH (ref 70–99)

## 2020-07-01 MED ORDER — DOXERCALCIFEROL 4 MCG/2ML IV SOLN
INTRAVENOUS | Status: AC
  Filled 2020-07-01: qty 2

## 2020-07-01 NOTE — Plan of Care (Signed)
?  Problem: Education: ?Goal: Knowledge of disease or condition will improve ?Outcome: Progressing ?  ?Problem: Education: ?Goal: Understanding of medication regimen will improve ?Outcome: Progressing ?  ?

## 2020-07-01 NOTE — Progress Notes (Signed)
[facility] Progress Note   Subjective:   Patient seen on hemodialysis. No concerns, feeling well. Denies SOB, CP, palpitations, and dizziness.   Objective Vitals:   07/01/20 0700 07/01/20 0710 07/01/20 0730 07/01/20 0800  BP: (!) 171/72 (!) 164/82 (!) 178/76 (!) 149/71  Pulse: (!) 52 (!) 53 (!) 51   Resp:   19 17  Temp: 98 F (36.7 C)     TempSrc: Oral     SpO2: 95%     Weight: 63.3 kg     Height:       Physical Exam General: Well developed male, alert and in NAD Heart: RRR, no murmurs, rubs or gallops Lungs: CTA bilaterally, no wheezing/rhonchi/rales. On RA Abdomen: Soft, non-tender, non-distended, +BS Extremities: B/l BKA, no edema Dialysis Access: RUE AVF accessed  Additional Objective Labs: Basic Metabolic Panel: Recent Labs  Lab 06/27/20 0736 06/29/20 0816 07/01/20 0719  NA 135 137 138  K 4.0 4.2 4.4  CL 104 104 103  CO2 16* 20* 21*  GLUCOSE 254* 120* 77  BUN 84* 77* 87*  CREATININE 6.43* 5.66* 5.32*  CALCIUM 7.3* 7.7* 7.7*  PHOS 5.6* 5.8* 6.0*   Liver Function Tests: Recent Labs  Lab 06/27/20 0736 06/29/20 0816 07/01/20 0719  ALBUMIN 1.9* 1.9* 2.0*   CBC: Recent Labs  Lab 06/24/20 1240 06/27/20 0736 06/29/20 0817 07/01/20 0719  WBC 12.0* 12.6* 10.7* 9.5  HGB 11.9* 11.5* 10.4* 10.2*  HCT 37.4* 38.6* 32.8* 33.7*  MCV 87.8 90.4 90.1 91.1  PLT 111* 90* 85* 73*   Blood Culture    Component Value Date/Time   SDES BLOOD LEFT HAND 06/13/2020 0803   SDES BLOOD LEFT HAND 06/13/2020 0803   SPECREQUEST  06/13/2020 0803    BOTTLES DRAWN AEROBIC AND ANAEROBIC Blood Culture adequate volume   SPECREQUEST  06/13/2020 0803    BOTTLES DRAWN AEROBIC AND ANAEROBIC Blood Culture adequate volume   CULT  06/13/2020 0803    NO GROWTH 5 DAYS Performed at [facility], [place]. [address], [place], [place] [address]    CULT  06/13/2020 0803    NO GROWTH 5 DAYS Performed at [facility] [address], [place], [place] [address]     REPTSTATUS 06/18/2020 FINAL 06/13/2020 0803   REPTSTATUS 06/18/2020 FINAL 06/13/2020 0803   CBG: Recent Labs  Lab 06/30/20 0701 06/30/20 1140 06/30/20 1656 06/30/20 2039 07/01/20 0648  GLUCAP 74 130* 118* 131* 75   Medications: . sodium chloride     . doxercalciferol      . apixaban  5 mg Oral BID  . aspirin  81 mg Oral Daily  . atorvastatin  10 mg Oral Q M,W,F  . Chlorhexidine Gluconate Cloth  6 each Topical Q0600  . Chlorhexidine Gluconate Cloth  6 each Topical Q0600  . doxercalciferol  2 mcg Intravenous Q T,Th,Sa-HD  . hydrALAZINE  10 mg Oral TID  . insulin aspart  0-15 Units Subcutaneous TID WC  . insulin aspart  0-5 Units Subcutaneous QHS  . insulin glargine  7 Units Subcutaneous Daily  . isosorbide mononitrate  15 mg Oral Daily  . lipase/protease/amylase  12,000 Units Oral TID AC  . magnesium chloride  2 tablet Oral BID  . metoprolol succinate  25 mg Oral Daily  . pantoprazole  80 mg Oral Daily  . predniSONE  5 mg Oral Q breakfast  . sertraline  50 mg Oral QHS  . sevelamer carbonate  800 mg Oral TID WC  . sodium  bicarbonate  1,950 mg Oral QID  . sodium chloride flush  3 mL Intravenous Q12H  . tacrolimus  2 mg Oral BID  . tamsulosin  0.4 mg Oral QPC supper    Dialysis Orders: TTS NW 3.5h 66kh 3K/2.25 bath RFA AVF Hep none - hect 2 ug - mircera 200 q2 , last 1/13  Assessment/Plan: 1. COVID-19 pneumonia:Now off isolation, Rxper admitting team.First positive test was 06/12/19 .s/p remdesivir and solumedrol. 2. ESRD:Tolerating dialysis well. Continue TTS schedule. Dialysis treatments are shortened here due to high inpatient census and staffingshortage 3. Hypokalemia: Improved after using 4K bath, K+ 4.4 pre-HD 4. Enterococcus bacteremia: ID recommended 2 weeks of IV abx, last dose 2/7.Echo negative for vegetations 5. Tachybradycardia syndrome/A-fib RVR:On metoprolol and eliquis.  A/C combined CHF: LVEF 25-30% here, G2DD.On aspirin, lipitor,  hydralazine, imdur andToprol with mild bradycardia. 6. Hypoxemia:nml WOBonRA now,no SOB.[name] 7. Hypertension/volume:BPwell controlled.Well under prior EDW. Will need lower EDW atd/c 8. Anemia:Hgb10.2, no ESA indicated at this time 9. Metabolic bone disease:CorrCa controlled, Phos5.8. Resumedhome binders (Renvela).Hectorol continue 10. T1DM: Insulin per primary 11. Disposition - pt can return to his outpatient HD as of 06/27/20 at [facility] but will need to wear an N95 mask for another 4 more days on treatment. Awaiting SNF placementversus CIR  Anice Paganini, PA-C 07/01/2020, 8:11 AM  [facility] Pager: [phone]

## 2020-07-01 NOTE — Progress Notes (Signed)
[facility] PROGRESS NOTE    Progress Note  Paul [name]  BZJ:[id number] DOB: [birth year] DOA: 06/11/2020 PCP: [name], MD     Brief Narrative:   Paul [name] is an 65 y.o. male past medical history of atrial fibrillation on Eliquis, end-stage renal disease with a failed transplant on dialysis Tuesday Thursdays and Saturdays, diabetes mellitus type 2, chronic diastolic heart failure comes into the ED secondary to dyspnea in the setting of missing dialysis found to be Covid positive with also Enterococcus bacteremia.  2D echo was done that showed severely reduced EF.  Evaluated by CIR but did not have a bed.  Is medically stable to be transferred to facility  Significant Events: 06/11/2020 admission  Significant studies: 06/11/2020 chest x-ray showed diffuse bilateral infiltrates within the mid lower lungs small left pleural effusion. 06/12/2020 CT of the head showed no acute intracranial hemorrhage or infarct, there is a remote lacunar infarct to the left hemisphere. 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 06/13/2020 transthoracic echo showed an EF of 25% no evidence of vegetation.[name]  Antibiotics: None  Microbiology data: Blood culture:  Procedures: None  Assessment/Plan:   Acute respiratory failure with hypoxia likely due to fluid overload: Resolved with dialysis.  Sepsis secondarily to bacteremia due to Enterococcus Likely urinary source, 2D echo as above no evidence of vegetation. Discussed with ID recommended 2 weeks of IV antibiotics, he has completed his course of antibiotics in house.  COVID-19 pneumonia: Treated for 5 days weaned off steroids. He has completed his isolation.  Acute metabolic encephalopathy: Likely due to infectious etiology now resolved.  End-stage renal disease on hemodialysis: Further management per  renal.  Paroxysmal atrial fibrillation with RVR/tachybradycardia syndrome: Resolved continue metoprolol and Eliquis.  Acute on chronic systolic heart failure: With an EF of 25% cardiology was consulted recommended to continue hydralazine, Imdur. Beta-blockers were held due to bradycardia. Entresto held due to renal disease  Diabetes mellitus type II: Decrease long-acting insulin as blood glucose was low this morning, continue sliding scale insulin.  Essential hypertension: Well-controlled continue metoprolol and Imdur.  Hyperlipidemia: Continue Lipitor.  Depression: Continue Zoloft  Dysphagia: Speech was consulted recommended regular consistency diet.  Stage II sacral decubitus ulcer present on admission RN Pressure Injury Documentation: Pressure Injury 01/18/20 Sacrum Stage 2 -  Partial thickness loss of dermis presenting as a shallow open injury with a red, pink wound bed without slough. (Active)  01/18/20 2251  Location: Sacrum  Location Orientation:   Staging: Stage 2 -  Partial thickness loss of dermis presenting as a shallow open injury with a red, pink wound bed without slough.  Wound Description (Comments):   Present on Admission: Yes    Estimated body mass index is 18.18 kg/m as calculated from the following:   Height as of this encounter: 6' (1.829 m).   Weight as of this encounter: 60.8 kg.   DVT prophylaxis: eliquis Family Communication:none Status is: Inpatient  Remains inpatient appropriate because:Hemodynamically unstable   Dispo:  Patient From: Home  Planned Disposition: [place]  Expected discharge date: 07/01/2020  Medically stable for discharge: Yes  Difficult to place patient: Yes       Code Status:     Code Status Orders  (From admission, onward)         Start     Ordered   06/11/20 1712  Full code  Continuous  06/11/20 1719        Code Status History    Date Active Date Inactive Code Status Order ID  Comments User Context   01/18/2020 2139 01/31/2020 1755 Full Code [id number]  Elizabeth Sauer Inpatient   01/18/2020 2139 01/18/2020 2139 Full Code [id number]  Elizabeth Sauer Inpatient   12/31/2019 1315 01/18/2020 2108 Full Code [id number]  Lequita Halt, MD ED   04/24/2019 0158 04/29/2019 1713 Full Code [id number]  Shela Leff, MD ED   10/28/2018 1748 11/04/2018 2111 Full Code [id number]  Luanne Bras, MD Inpatient   10/28/2018 1422 10/28/2018 1748 Full Code [id number]  Marliss Coots, PA-C ED   05/24/2018 0109 05/25/2018 1832 Full Code [id number]  Toy Baker, MD Inpatient   09/03/2013 1528 09/04/2013 1350 Full Code [id number]  Newt Minion, MD Inpatient   08/03/2013 1710 08/14/2013 1700 Full Code [id number]  Cathlyn Parsons, PA-C Inpatient   07/30/2013 1008 08/03/2013 1710 Full Code [id number]  Newt Minion, MD Inpatient   07/09/2013 1927 07/13/2013 2144 Full Code [id number]  Etta Quill, DO ED   05/31/2013 1602 06/11/2013 1703 Full Code [id number]  Cathlyn Parsons, PA-C Inpatient   05/24/2013 2116 05/31/2013 1602 Full Code [id number]  Theodis Blaze, MD ED   04/02/2013 1538 04/04/2013 1724 Full Code [id number]  Elmarie Shiley, MD Inpatient   Advance Care Planning Activity        IV Access:    Peripheral IV   Procedures and diagnostic studies:   No results found.   Medical Consultants:    None.   Subjective:    Paul [name] no complaints  Objective:    Vitals:   07/01/20 0900 07/01/20 0930 07/01/20 0940 07/01/20 0955  BP: 131/64 126/64 (!) 130/58 (!) 143/68  Pulse:      Resp: (!) 21 18 18 18   Temp:    97.9 F (36.6 C)  TempSrc:    Oral  SpO2:   97%   Weight:   60.8 kg   Height:       SpO2: 97 % O2 Flow Rate (L/min): 1 L/min   Intake/Output Summary (Last 24 hours) at 07/01/2020 1010 Last data filed at 07/01/2020 0940 Gross per 24 hour  Intake 240 ml  Output 2550 ml  Net -2310 ml   Filed Weights   06/30/20 2040 07/01/20 0700  07/01/20 0940  Weight: 64.6 kg 63.3 kg 60.8 kg    Exam: General exam: In no acute distress. Respiratory system: Good air movement and clear to auscultation. Cardiovascular system: S1 & S2 heard, RRR. No JVD. Gastrointestinal system: Abdomen is nondistended, soft and nontender.  Extremities: No pedal edema. Skin: No rashes, lesions or ulcers Psychiatry: Judgement and insight appear normal. Mood & affect appropriate.  Data Reviewed:    Labs: Basic Metabolic Panel: Recent Labs  Lab 06/24/20 1240 06/27/20 0736 06/29/20 0816 07/01/20 0719  NA 136 135 137 138  K 3.1* 4.0 4.2 4.4  CL 103 104 104 103  CO2 15* 16* 20* 21*  GLUCOSE 253* 254* 120* 77  BUN 84* 84* 77* 87*  CREATININE 5.49* 6.43* 5.66* 5.32*  CALCIUM 7.1* 7.3* 7.7* 7.7*  PHOS 5.5* 5.6* 5.8* 6.0*   GFR Estimated Creatinine Clearance: 11.7 mL/min (A) (by C-G formula based on SCr of 5.32 mg/dL (H)). Liver Function Tests: Recent Labs  Lab 06/24/20 1240 06/27/20 0736 06/29/20 0816 07/01/20 0719  ALBUMIN 1.9* 1.9* 1.9* 2.0*   No results for input(s): LIPASE, AMYLASE  in the last 168 hours. No results for input(s): AMMONIA in the last 168 hours. Coagulation profile No results for input(s): INR, PROTIME in the last 168 hours. COVID-19 Labs  No results for input(s): DDIMER, FERRITIN, LDH, CRP in the last 72 hours.  Lab Results  Component Value Date   SARSCOV2NAA POSITIVE (A) 06/11/2020   SARSCOV2NAA NEGATIVE 12/31/2019   [place] NEGATIVE 10/12/2019   [place] NEGATIVE 04/23/2019    CBC: Recent Labs  Lab 06/24/20 1240 06/27/20 0736 06/29/20 0817 07/01/20 0719  WBC 12.0* 12.6* 10.7* 9.5  HGB 11.9* 11.5* 10.4* 10.2*  HCT 37.4* 38.6* 32.8* 33.7*  MCV 87.8 90.4 90.1 91.1  PLT 111* 90* 85* 73*   Cardiac Enzymes: No results for input(s): CKTOTAL, CKMB, CKMBINDEX, TROPONINI in the last 168 hours. BNP (last 3 results) No results for input(s): PROBNP in the last 8760 hours. CBG: Recent Labs  Lab  06/30/20 0701 06/30/20 1140 06/30/20 1656 06/30/20 2039 07/01/20 0648  GLUCAP 74 130* 118* 131* 75   D-Dimer: No results for input(s): DDIMER in the last 72 hours. Hgb A1c: No results for input(s): HGBA1C in the last 72 hours. Lipid Profile: No results for input(s): CHOL, HDL, LDLCALC, TRIG, CHOLHDL, LDLDIRECT in the last 72 hours. Thyroid function studies: No results for input(s): TSH, T4TOTAL, T3FREE, THYROIDAB in the last 72 hours.  Invalid input(s): FREET3 Anemia work up: No results for input(s): VITAMINB12, FOLATE, FERRITIN, TIBC, IRON, RETICCTPCT in the last 72 hours. Sepsis Labs: Recent Labs  Lab 06/24/20 1240 06/27/20 0736 06/29/20 0817 07/01/20 0719  WBC 12.0* 12.6* 10.7* 9.5   Microbiology No results found for this or any previous visit (from the past 240 hour(s)).   Medications:   . doxercalciferol      . apixaban  5 mg Oral BID  . aspirin  81 mg Oral Daily  . atorvastatin  10 mg Oral Q M,W,F  . Chlorhexidine Gluconate Cloth  6 each Topical Q0600  . doxercalciferol  2 mcg Intravenous Q T,Th,Sa-HD  . hydrALAZINE  10 mg Oral TID  . insulin aspart  0-15 Units Subcutaneous TID WC  . insulin aspart  0-5 Units Subcutaneous QHS  . insulin glargine  7 Units Subcutaneous Daily  . isosorbide mononitrate  15 mg Oral Daily  . lipase/protease/amylase  12,000 Units Oral TID AC  . magnesium chloride  2 tablet Oral BID  . metoprolol succinate  25 mg Oral Daily  . pantoprazole  80 mg Oral Daily  . predniSONE  5 mg Oral Q breakfast  . sertraline  50 mg Oral QHS  . sevelamer carbonate  800 mg Oral TID WC  . sodium bicarbonate  1,950 mg Oral QID  . sodium chloride flush  3 mL Intravenous Q12H  . tacrolimus  2 mg Oral BID  . tamsulosin  0.4 mg Oral QPC supper   Continuous Infusions: . sodium chloride        LOS: 20 days   Charlynne Cousins  [facility]  07/01/2020, 10:10 AM

## 2020-07-02 ENCOUNTER — Inpatient Hospital Stay (HOSPITAL_COMMUNITY): Payer: Medicare Other

## 2020-07-02 DIAGNOSIS — E1159 Type 2 diabetes mellitus with other circulatory complications: Secondary | ICD-10-CM | POA: Diagnosis not present

## 2020-07-02 DIAGNOSIS — B952 Enterococcus as the cause of diseases classified elsewhere: Secondary | ICD-10-CM | POA: Diagnosis not present

## 2020-07-02 DIAGNOSIS — N186 End stage renal disease: Secondary | ICD-10-CM | POA: Diagnosis not present

## 2020-07-02 DIAGNOSIS — R7881 Bacteremia: Secondary | ICD-10-CM | POA: Diagnosis not present

## 2020-07-02 LAB — TROPONIN I (HIGH SENSITIVITY): Troponin I (High Sensitivity): 30 ng/L — ABNORMAL HIGH (ref <18)

## 2020-07-02 LAB — GLUCOSE, CAPILLARY
Glucose-Capillary: 102 mg/dL — ABNORMAL HIGH (ref 70–99)
Glucose-Capillary: 126 mg/dL — ABNORMAL HIGH (ref 70–99)
Glucose-Capillary: 154 mg/dL — ABNORMAL HIGH (ref 70–99)
Glucose-Capillary: 97 mg/dL (ref 70–99)

## 2020-07-02 NOTE — Progress Notes (Signed)
[facility] Progress Note   Subjective:   No concerns, reports dialysis went well yesterday. Denies SOB, CP, palpitations, dizziness, abdominal pain and nausea  Objective Vitals:   07/01/20 1622 07/01/20 2051 07/02/20 0449 07/02/20 0500  BP: 136/71 118/66 (!) 142/66   Pulse: (!) 51 (!) 59 (!) 53   Resp: 18     Temp: 98.2 F (36.8 C) 98.6 F (37 C) 98.2 F (36.8 C)   TempSrc: Oral Oral Oral   SpO2: 97% 98% 98%   Weight:  61.4 kg  61.4 kg  Height:       Physical Exam General:Well developed male, alert and in NAD Heart:Slightly bradycardic, regular rhythm. No murmurs, rubs or gallops Lungs:CTA bilaterally, no wheezing/rhonchi/rales. On RA Abdomen:Soft, non-tender, non-distended, +BS Extremities:B/l BKA, no edema  Dialysis Access:RUE AVF + bruit  Additional Objective Labs: Basic Metabolic Panel: Recent Labs  Lab 06/27/20 0736 06/29/20 0816 07/01/20 0719  NA 135 137 138  K 4.0 4.2 4.4  CL 104 104 103  CO2 16* 20* 21*  GLUCOSE 254* 120* 77  BUN 84* 77* 87*  CREATININE 6.43* 5.66* 5.32*  CALCIUM 7.3* 7.7* 7.7*  PHOS 5.6* 5.8* 6.0*   Liver Function Tests: Recent Labs  Lab 06/27/20 0736 06/29/20 0816 07/01/20 0719  ALBUMIN 1.9* 1.9* 2.0*   CBC: Recent Labs  Lab 06/27/20 0736 06/29/20 0817 07/01/20 0719  WBC 12.6* 10.7* 9.5  HGB 11.5* 10.4* 10.2*  HCT 38.6* 32.8* 33.7*  MCV 90.4 90.1 91.1  PLT 90* 85* 73*   Blood Culture    Component Value Date/Time   SDES BLOOD LEFT HAND 06/13/2020 0803   SDES BLOOD LEFT HAND 06/13/2020 0803   SPECREQUEST  06/13/2020 0803    BOTTLES DRAWN AEROBIC AND ANAEROBIC Blood Culture adequate volume   SPECREQUEST  06/13/2020 0803    BOTTLES DRAWN AEROBIC AND ANAEROBIC Blood Culture adequate volume   CULT  06/13/2020 0803    NO GROWTH 5 DAYS Performed at [facility] [address] [place], [place] [address]    CULT  06/13/2020 0803    NO GROWTH 5 DAYS Performed at [facility] [address] [place], [place] [address]    REPTSTATUS 06/18/2020 FINAL 06/13/2020 0803   REPTSTATUS 06/18/2020 FINAL 06/13/2020 0803   CBG: Recent Labs  Lab 07/01/20 1017 07/01/20 1149 07/01/20 1621 07/01/20 2052 07/02/20 0648  GLUCAP 125* 128* 236* 128* 102*   Medications: . sodium chloride     . apixaban  5 mg Oral BID  . aspirin  81 mg Oral Daily  . atorvastatin  10 mg Oral Q M,W,F  . Chlorhexidine Gluconate Cloth  6 each Topical Q0600  . doxercalciferol  2 mcg Intravenous Q T,Th,Sa-HD  . hydrALAZINE  10 mg Oral TID  . insulin aspart  0-15 Units Subcutaneous TID WC  . insulin aspart  0-5 Units Subcutaneous QHS  . insulin glargine  7 Units Subcutaneous Daily  . isosorbide mononitrate  15 mg Oral Daily  . lipase/protease/amylase  12,000 Units Oral TID AC  . magnesium chloride  2 tablet Oral BID  . metoprolol succinate  25 mg Oral Daily  . pantoprazole  80 mg Oral Daily  . predniSONE  5 mg Oral Q breakfast  . sertraline  50 mg Oral QHS  . sevelamer carbonate  800 mg Oral TID WC  . sodium bicarbonate  1,950 mg Oral QID  . sodium chloride flush  3 mL Intravenous Q12H  . tacrolimus  2 mg Oral BID  . tamsulosin  0.4 mg Oral QPC supper    Outpatient Dialysis Orders: TTS NW 3.5h 66kh 3K/2.25 bath RFA AVF Hep none - hect 2 ug - mircera 200 q2 , last 1/13  Assessment/Plan: 1. COVID-19 pneumonia:Now off isolation, Rxper admitting team.First positive test was 06/12/19 .s/p remdesivir and solumedrol. 2. ESRD:Tolerating dialysis well. ContinueTTS schedule. Dialysis treatments are shortened here due to high inpatient census and staffingshortage 3. Hypokalemia:Improved after using 4K bath, K+ 4.4 pre-HD yesterday 4. Enterococcus bacteremia: ID recommended 2 weeks of IV abx, last dose 2/7.Echo negative for vegetations 5. Tachybradycardia syndrome/A-fib RVR:On metoprolol and eliquis. A/C combined CHF: LVEF 25-30% here, G2DD.On aspirin, lipitor, hydralazine, imdur  andToprol with mild bradycardia. 6. Hypoxemia:Due to COVID. Resolved. 7. Hypertension/volume:BPwell controlled.Well under prior EDW. Will need lower EDW atd/c 8. Anemia:Hgb10.2, no ESA indicated at this time 9. Metabolic bone disease:CorrCa controlled, Phos5.8. Resumedhome binders (Renvela).Hectorol continue 10. T1DM: Insulin per primary 11. Disposition - pt can return to his outpatient HD as of 06/27/20 at [facility] but would need to wear an N95 mask for another2 moredays on treatment. Awaiting SNF placementversus CIR   Anice Paganini, PA-C 07/02/2020, 9:11 AM  [facility] Pager: [phone]

## 2020-07-02 NOTE — Plan of Care (Signed)
?  Problem: Education: ?Goal: Knowledge of disease or condition will improve ?Outcome: Progressing ?  ?Problem: Education: ?Goal: Understanding of medication regimen will improve ?Outcome: Progressing ?  ?

## 2020-07-02 NOTE — Progress Notes (Signed)
Cross-coverage note:   Patient was seen for chest pain. States he developed ache in left chest this afternoon while at rest, it has been constant, now starting to ease off. No alleviating or exacerbating factors noted, denies SOB, N/V, or sweating.   He appears comfortable, HR 50s and regular, no respiratory distress, no pallor or diaphoresis.   Plan to check EKG, troponin, and CXR.

## 2020-07-02 NOTE — Progress Notes (Signed)
[facility] PROGRESS NOTE    Progress Note  Paul [name]  XBM:[id number] DOB: August 11, 1955 DOA: 06/11/2020 PCP: [name], MD     Brief Narrative:   Paul [name] is an 65 y.o. male past medical history of atrial fibrillation on Eliquis, end-stage renal disease with a failed transplant on dialysis Tuesday Thursdays and Saturdays, diabetes mellitus type 2, chronic diastolic heart failure comes into the ED secondary to dyspnea in the setting of missing dialysis found to be Covid positive with also Enterococcus bacteremia.  2D echo was done that showed severely reduced EF.  Evaluated by CIR but did not have a bed.  Is medically stable to be transferred to facility  Significant Events: 06/11/2020 admission  Significant studies: 06/11/2020 chest x-ray showed diffuse bilateral infiltrates within the mid lower lungs small left pleural effusion. 06/12/2020 CT of the head showed no acute intracranial hemorrhage or infarct, there is a remote lacunar infarct to the left hemisphere. 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 06/13/2020 transthoracic echo showed an EF of 25% no evidence of vegetation.[name]  Antibiotics: None  Microbiology data: Blood culture:  Procedures: None  Assessment/Plan:   Acute respiratory failure with hypoxia likely due to fluid overload: Resolved with dialysis.  Sepsis secondarily to bacteremia due to Enterococcus Likely urinary source, 2D echo as above no evidence of vegetation. Discussed with ID recommended 2 weeks of IV antibiotics, he has completed his course of antibiotics in house.  COVID-19 pneumonia: Treated for 5 days weaned off steroids. He has completed his isolation.  Acute metabolic encephalopathy: Likely due to infectious etiology now resolved.  End-stage renal disease on hemodialysis: Further management per  renal.  Paroxysmal atrial fibrillation with RVR/tachybradycardia syndrome: Resolved continue metoprolol and Eliquis.  Acute on chronic systolic heart failure: With an EF of 25% cardiology was consulted recommended to continue hydralazine, Imdur. Beta-blockers were held due to bradycardia. Entresto held due to renal disease  Diabetes mellitus type II: Decrease long-acting insulin as blood glucose was low this morning, continue sliding scale insulin.  Essential hypertension: Well-controlled continue metoprolol and Imdur.  Hyperlipidemia: Continue Lipitor.  Depression: Continue Zoloft  Dysphagia: Speech was consulted recommended regular consistency diet.  Stage II sacral decubitus ulcer present on admission RN Pressure Injury Documentation: Pressure Injury 01/18/20 Sacrum Stage 2 -  Partial thickness loss of dermis presenting as a shallow open injury with a red, pink wound bed without slough. (Active)  01/18/20 2251  Location: Sacrum  Location Orientation:   Staging: Stage 2 -  Partial thickness loss of dermis presenting as a shallow open injury with a red, pink wound bed without slough.  Wound Description (Comments):   Present on Admission: Yes    Estimated body mass index is 18.36 kg/m as calculated from the following:   Height as of this encounter: 6' (1.829 m).   Weight as of this encounter: 61.4 kg.   DVT prophylaxis: eliquis Family Communication:none Status is: Inpatient  Remains inpatient appropriate because:Hemodynamically unstable   Dispo:  Patient From: Home  Planned Disposition: [place]  Expected discharge date: 07/01/2020  Medically stable for discharge: Yes  Difficult to place patient: Yes       Code Status:     Code Status Orders  (From admission, onward)         Start     Ordered   06/11/20 1712  Full code  Continuous  06/11/20 1719        Code Status History    Date Active Date Inactive Code Status Order ID  Comments User Context   01/18/2020 2139 01/31/2020 1755 Full Code [id number]  Elizabeth Sauer Inpatient   01/18/2020 2139 01/18/2020 2139 Full Code [id number]  Elizabeth Sauer Inpatient   12/31/2019 1315 01/18/2020 2108 Full Code [id number]  Lequita Halt, MD ED   04/24/2019 0158 04/29/2019 1713 Full Code [id number]  Shela Leff, MD ED   10/28/2018 1748 11/04/2018 2111 Full Code [id number]  Luanne Bras, MD Inpatient   10/28/2018 1422 10/28/2018 1748 Full Code [id number]  Marliss Coots, PA-C ED   05/24/2018 0109 05/25/2018 1832 Full Code [id number]  Toy Baker, MD Inpatient   09/03/2013 1528 09/04/2013 1350 Full Code [id number]  Newt Minion, MD Inpatient   08/03/2013 1710 08/14/2013 1700 Full Code [id number]  Cathlyn Parsons, PA-C Inpatient   07/30/2013 1008 08/03/2013 1710 Full Code [id number]  Newt Minion, MD Inpatient   07/09/2013 1927 07/13/2013 2144 Full Code [id number]  Etta Quill, DO ED   05/31/2013 1602 06/11/2013 1703 Full Code [id number]  Cathlyn Parsons, PA-C Inpatient   05/24/2013 2116 05/31/2013 1602 Full Code [id number]  Theodis Blaze, MD ED   04/02/2013 1538 04/04/2013 1724 Full Code [id number]  Elmarie Shiley, MD Inpatient   Advance Care Planning Activity        IV Access:    Peripheral IV   Procedures and diagnostic studies:   No results found.   Medical Consultants:    None.   Subjective:    Paul [name] no complains Objective:    Vitals:   07/01/20 1622 07/01/20 2051 07/02/20 0449 07/02/20 0500  BP: 136/71 118/66 (!) 142/66   Pulse: (!) 51 (!) 59 (!) 53   Resp: 18     Temp: 98.2 F (36.8 C) 98.6 F (37 C) 98.2 F (36.8 C)   TempSrc: Oral Oral Oral   SpO2: 97% 98% 98%   Weight:  61.4 kg  61.4 kg  Height:       SpO2: 98 % O2 Flow Rate (L/min): 1 L/min   Intake/Output Summary (Last 24 hours) at 07/02/2020 0927 Last data filed at 07/02/2020 0902 Gross per 24 hour  Intake 363 ml  Output 2500 ml  Net -2137 ml    Filed Weights   07/01/20 0940 07/01/20 2051 07/02/20 0500  Weight: 60.8 kg 61.4 kg 61.4 kg    Exam: General exam: In no acute distress. Respiratory system: Good air movement and clear to auscultation. Cardiovascular system: S1 & S2 heard, RRR. No JVD. Gastrointestinal system: Abdomen is nondistended, soft and nontender.  Extremities: No pedal edema. Skin: No rashes, lesions or ulcers Psychiatry: Judgement and insight appear normal. Mood & affect appropriate.  Data Reviewed:    Labs: Basic Metabolic Panel: Recent Labs  Lab 06/27/20 0736 06/29/20 0816 07/01/20 0719  NA 135 137 138  K 4.0 4.2 4.4  CL 104 104 103  CO2 16* 20* 21*  GLUCOSE 254* 120* 77  BUN 84* 77* 87*  CREATININE 6.43* 5.66* 5.32*  CALCIUM 7.3* 7.7* 7.7*  PHOS 5.6* 5.8* 6.0*   GFR Estimated Creatinine Clearance: 11.9 mL/min (A) (by C-G formula based on SCr of 5.32 mg/dL (H)). Liver Function Tests: Recent Labs  Lab 06/27/20 0736 06/29/20 0816 07/01/20 0719  ALBUMIN 1.9* 1.9* 2.0*   No results for input(s): LIPASE, AMYLASE in the last 168 hours. No results  for input(s): AMMONIA in the last 168 hours. Coagulation profile No results for input(s): INR, PROTIME in the last 168 hours. COVID-19 Labs  No results for input(s): DDIMER, FERRITIN, LDH, CRP in the last 72 hours.  Lab Results  Component Value Date   SARSCOV2NAA POSITIVE (A) 06/11/2020   SARSCOV2NAA NEGATIVE 12/31/2019   [place] NEGATIVE 10/12/2019   [place] NEGATIVE 04/23/2019    CBC: Recent Labs  Lab 06/27/20 0736 06/29/20 0817 07/01/20 0719  WBC 12.6* 10.7* 9.5  HGB 11.5* 10.4* 10.2*  HCT 38.6* 32.8* 33.7*  MCV 90.4 90.1 91.1  PLT 90* 85* 73*   Cardiac Enzymes: No results for input(s): CKTOTAL, CKMB, CKMBINDEX, TROPONINI in the last 168 hours. BNP (last 3 results) No results for input(s): PROBNP in the last 8760 hours. CBG: Recent Labs  Lab 07/01/20 1017 07/01/20 1149 07/01/20 1621 07/01/20 2052  07/02/20 0648  GLUCAP 125* 128* 236* 128* 102*   D-Dimer: No results for input(s): DDIMER in the last 72 hours. Hgb A1c: No results for input(s): HGBA1C in the last 72 hours. Lipid Profile: No results for input(s): CHOL, HDL, LDLCALC, TRIG, CHOLHDL, LDLDIRECT in the last 72 hours. Thyroid function studies: No results for input(s): TSH, T4TOTAL, T3FREE, THYROIDAB in the last 72 hours.  Invalid input(s): FREET3 Anemia work up: No results for input(s): VITAMINB12, FOLATE, FERRITIN, TIBC, IRON, RETICCTPCT in the last 72 hours. Sepsis Labs: Recent Labs  Lab 06/27/20 0736 06/29/20 0817 07/01/20 0719  WBC 12.6* 10.7* 9.5   Microbiology No results found for this or any previous visit (from the past 240 hour(s)).   Medications:   . apixaban  5 mg Oral BID  . aspirin  81 mg Oral Daily  . atorvastatin  10 mg Oral Q M,W,F  . Chlorhexidine Gluconate Cloth  6 each Topical Q0600  . doxercalciferol  2 mcg Intravenous Q T,Th,Sa-HD  . hydrALAZINE  10 mg Oral TID  . insulin aspart  0-15 Units Subcutaneous TID WC  . insulin aspart  0-5 Units Subcutaneous QHS  . insulin glargine  7 Units Subcutaneous Daily  . isosorbide mononitrate  15 mg Oral Daily  . lipase/protease/amylase  12,000 Units Oral TID AC  . magnesium chloride  2 tablet Oral BID  . metoprolol succinate  25 mg Oral Daily  . pantoprazole  80 mg Oral Daily  . predniSONE  5 mg Oral Q breakfast  . sertraline  50 mg Oral QHS  . sevelamer carbonate  800 mg Oral TID WC  . sodium bicarbonate  1,950 mg Oral QID  . sodium chloride flush  3 mL Intravenous Q12H  . tacrolimus  2 mg Oral BID  . tamsulosin  0.4 mg Oral QPC supper   Continuous Infusions: . sodium chloride        LOS: 21 days   Charlynne Cousins  [facility]  07/02/2020, 9:27 AM

## 2020-07-03 DIAGNOSIS — R7881 Bacteremia: Secondary | ICD-10-CM | POA: Diagnosis not present

## 2020-07-03 DIAGNOSIS — U071 COVID-19: Secondary | ICD-10-CM | POA: Diagnosis not present

## 2020-07-03 DIAGNOSIS — N186 End stage renal disease: Secondary | ICD-10-CM | POA: Diagnosis not present

## 2020-07-03 DIAGNOSIS — I5031 Acute diastolic (congestive) heart failure: Secondary | ICD-10-CM | POA: Diagnosis not present

## 2020-07-03 LAB — GLUCOSE, CAPILLARY
Glucose-Capillary: 81 mg/dL (ref 70–99)
Glucose-Capillary: 111 mg/dL — ABNORMAL HIGH (ref 70–99)
Glucose-Capillary: 142 mg/dL — ABNORMAL HIGH (ref 70–99)
Glucose-Capillary: 242 mg/dL — ABNORMAL HIGH (ref 70–99)

## 2020-07-03 LAB — TROPONIN I (HIGH SENSITIVITY): Troponin I (High Sensitivity): 31 ng/L — ABNORMAL HIGH (ref <18)

## 2020-07-03 NOTE — Progress Notes (Signed)
Inpatient Rehab Admissions Coordinator:   I have no beds available for this patient to admit to CIR today.    Shann Medal, PT, DPT Admissions Coordinator [phone] 07/03/20  9:53 AM

## 2020-07-03 NOTE — Progress Notes (Signed)
  [facility] Progress Note   Assessment/ Plan:   1.  COVID-19 infection/pneumonia: With prolonged hospitalization and significant deconditioning and anticipated admission to inpatient rehabilitation for continued intensive PT/OT. 2. ESRD: He is usually on a TTS dialysis schedule and this has been ordered again for tomorrow.  He does not have any acute dialysis indications at this time and I will order for additional ultrafiltration based on chest x-ray findings from last night. 3. Anemia: Without overt blood loss, not on ESA at this time. 4. CKD-MBD: On Hectorol for PTH suppression and sevelamer for phosphorus binding-we will continue this with monitoring of calcium/phosphorus level. 5.  Enterococcus bacteremia: Exact source unclear, completed antibiotics and evaluation negative for SBE. 6. Hypertension: Blood pressure marginally elevated-possibly related to chest pain/discomfort.  Continue to monitor with hemodialysis.  Subjective:   Reports some intermittent chest pain-had this overnight as well and was evaluated with EKG/chest x-ray that did not show any acute problem.   Objective:   BP (!) 149/64 (BP Location: Left Arm)   Pulse (!) 51   Temp 98.1 F (36.7 C) (Oral)   Resp 18   Ht 6' (1.829 m)   Wt 61.4 kg   SpO2 95%   BMI 18.36 kg/m   Physical Exam: Gen: Appears comfortable resting in bed, RN at bedside. CVS: Pulse regular bradycardia, S1 and S2 normal Resp: Anteriorly clear to auscultation, no distinct rales or rhonchi Abd: Soft, flat, nontender, bowel sounds normal Ext: Right radiocephalic fistula with palpable thrill.  Status post bilateral below-knee amputation with well-healed stumps and no thigh edema  Labs: BMET Recent Labs  Lab 06/27/20 0736 06/29/20 0816 07/01/20 0719  NA 135 137 138  K 4.0 4.2 4.4  CL 104 104 103  CO2 16* 20* 21*  GLUCOSE 254* 120* 77  BUN 84* 77* 87*  CREATININE 6.43* 5.66* 5.32*  CALCIUM 7.3* 7.7* 7.7*  PHOS 5.6* 5.8*  6.0*   CBC Recent Labs  Lab 06/27/20 0736 06/29/20 0817 07/01/20 0719  WBC 12.6* 10.7* 9.5  HGB 11.5* 10.4* 10.2*  HCT 38.6* 32.8* 33.7*  MCV 90.4 90.1 91.1  PLT 90* 85* 73*      Medications:    . apixaban  5 mg Oral BID  . aspirin  81 mg Oral Daily  . atorvastatin  10 mg Oral Q M,W,F  . Chlorhexidine Gluconate Cloth  6 each Topical Q0600  . doxercalciferol  2 mcg Intravenous Q T,Th,Sa-HD  . hydrALAZINE  10 mg Oral TID  . insulin aspart  0-15 Units Subcutaneous TID WC  . insulin aspart  0-5 Units Subcutaneous QHS  . insulin glargine  7 Units Subcutaneous Daily  . isosorbide mononitrate  15 mg Oral Daily  . lipase/protease/amylase  12,000 Units Oral TID AC  . magnesium chloride  2 tablet Oral BID  . metoprolol succinate  25 mg Oral Daily  . pantoprazole  80 mg Oral Daily  . predniSONE  5 mg Oral Q breakfast  . sertraline  50 mg Oral QHS  . sevelamer carbonate  800 mg Oral TID WC  . sodium bicarbonate  1,950 mg Oral QID  . sodium chloride flush  3 mL Intravenous Q12H  . tacrolimus  2 mg Oral BID  . tamsulosin  0.4 mg Oral QPC supper     Elmarie Shiley, MD 07/03/2020, 11:07 AM

## 2020-07-03 NOTE — Progress Notes (Signed)
Physical Therapy Treatment Patient Details Name: TANYON ALIPIO MRN: [id number] DOB: 1955/10/20 Today's Date: 07/03/2020    History of Present Illness 65 yo male presenting to ED via EMS after missing dialysis and weakness with COVID-19 +. Off COVID precautions on 2/3. PMH including HTN, DM2, renal transplant, CKD st IV, calciphylaxis, A-Fib, bil. TTAs, bilateral BKA with prosthesis,and TIA.    PT Comments    Pt received in bed, tired and reporting sharp and shooting chest pain. Deferred OOB mobility due to chest pain, but cleared by medical team for exercises in bed. Focused on LE and abdominal strengthening exercises. No change in chest pain with exercises or following exercises. Pt educated that it is safe for him to do the exercises throughout the day in bed and to stop if they increase his chest pain. Pt demonstrated understanding. Pt left in bed with all needs met, RN aware of status, and call bell within reach.    Follow Up Recommendations  CIR     Equipment Recommendations  Rolling walker with 5" wheels;3in1 (PT);Wheelchair (measurements PT);Wheelchair cushion (measurements PT) (TBD)    Recommendations for Other Services Rehab consult     Precautions / Restrictions Precautions Precautions: Fall;Other (comment) Precaution Comments: B BKA with prosthetics in room now, mod fall Restrictions Weight Bearing Restrictions: No    Mobility  Bed Mobility               General bed mobility comments: Deferred due to chest pain    Transfers                 General transfer comment: Deferred due to chest pain  Ambulation/Gait             General Gait Details: Deferred due to chest pain   Stairs             Wheelchair Mobility    Modified Rankin (Stroke Patients Only)       Balance Overall balance assessment: Needs assistance Sitting-balance support: Feet supported Sitting balance-Leahy Scale: Good     Standing balance support: Bilateral  upper extremity supported Standing balance-Leahy Scale: Poor Standing balance comment: Needed B UE support in standing                            Cognition Arousal/Alertness: Awake/alert Behavior During Therapy: WFL for tasks assessed/performed Overall Cognitive Status: No family/caregiver present to determine baseline cognitive functioning Area of Impairment: Problem solving                   Current Attention Level: Sustained       Awareness: Emergent Problem Solving: Slow processing;Requires verbal cues General Comments: WFL but more tired today due to chest pain. Answers questions appropriately, able to make conversation, and follow commands consistently      Exercises Other Exercises Other Exercises: Supine hip abduction, 1x10 B with no resistance, 1x5 B with minimal manual resistance Other Exercises: SLR, 1x10 B with no resistance, 1x5 B with minimal manual resistance Other Exercises: Supine Posterior pelvic tilt with TRA contraction, x10 with 3 second hold Other Exercises: LAQs, 1x10 with 5 second hold B    General Comments        Pertinent Vitals/Pain Pain Assessment: Faces Faces Pain Scale: Hurts even more Pain Location: Chest pain Pain Descriptors / Indicators: Sharp;Shooting;Grimacing Pain Intervention(s): Limited activity within patient's tolerance;Monitored during session    Home Living  Prior Function            PT Goals (current goals can now be found in the care plan section) Acute Rehab PT Goals Patient Stated Goal: increase endurance, be able to go home safely    Frequency    Min 3X/week      PT Plan Frequency needs to be updated    Co-evaluation              AM-PAC PT "6 Clicks" Mobility   Outcome Measure  Help needed turning from your back to your side while in a flat bed without using bedrails?: None Help needed moving from lying on your back to sitting on the side of a flat bed  without using bedrails?: A Lot Help needed moving to and from a bed to a chair (including a wheelchair)?: A Little Help needed standing up from a chair using your arms (e.g., wheelchair or bedside chair)?: A Lot Help needed to walk in hospital room?: A Little Help needed climbing 3-5 steps with a railing? : A Lot 6 Click Score: 16    End of Session Equipment Utilized During Treatment: Gait belt Activity Tolerance: Patient tolerated treatment well;Patient limited by fatigue Patient left: with call bell/phone within reach;in bed Nurse Communication: Mobility status PT Visit Diagnosis: Muscle weakness (generalized) (M62.81);Other abnormalities of gait and mobility (R26.89)     Time:  -     Charges:                       Rosita Kea, SPT

## 2020-07-03 NOTE — Progress Notes (Signed)
[facility] PROGRESS NOTE    Progress Note  Paul [name]  RXV:[id number] DOB: 03/21/1956 DOA: 06/11/2020 PCP: [name], MD     Brief Narrative:   Paul [name] is an 65 y.o. male past medical history of atrial fibrillation on Eliquis, end-stage renal disease with a failed transplant on dialysis Tuesday Thursdays and Saturdays, diabetes mellitus type 2, chronic diastolic heart failure comes into the ED secondary to dyspnea in the setting of missing dialysis found to be Covid positive with also Enterococcus bacteremia.  2D echo was done that showed severely reduced EF.  Evaluated by CIR but did not have a bed.  Is medically stable to be transferred to facility  Significant Events: 06/11/2020 admission  Significant studies: 06/11/2020 chest x-ray showed diffuse bilateral infiltrates within the mid lower lungs small left pleural effusion. 06/12/2020 CT of the head showed no acute intracranial hemorrhage or infarct, there is a remote lacunar infarct to the left hemisphere. 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 06/13/2020 transthoracic echo showed an EF of 25% no evidence of vegetation.[name]  Antibiotics: None  Microbiology data: Blood culture:  Procedures: None  Assessment/Plan:   Acute respiratory failure with hypoxia likely due to fluid overload: Resolved with dialysis.  Sepsis secondarily to bacteremia due to Enterococcus Likely urinary source, 2D echo as above no evidence of vegetation. Discussed with ID recommended 2 weeks of IV antibiotics, he has completed his course of antibiotics in house.  COVID-19 pneumonia: Treated for 5 days weaned off steroids. He has completed his isolation.  Acute metabolic encephalopathy: Likely due to infectious etiology now resolved.  End-stage renal disease on hemodialysis: Further management per  renal.  Paroxysmal atrial fibrillation with RVR/tachybradycardia syndrome: Had an episode of paroxysmal atrial fibrillation overnight now back in sinus rhythm continue metoprolol and Eliquis.  Atypical chest pain: Likely musculoskeletal reproducible by palpation, Twelve-lead EKG shows sinus bradycardia with no ST segment changes. Chest x-ray showed no infiltrates or fractures. Cardiac biomarkers have basically remained flat chest slightly elevated but flat the elevation is likely due to chronic renal disease.  Acute on chronic systolic heart failure: With an EF of 25% cardiology was consulted recommended to continue hydralazine, Imdur. His metoprolol was resumed he continues to be sinus bradycardia in the 55 to 60s.  Diabetes mellitus type II: Decrease long-acting insulin as blood glucose was low this morning, continue sliding scale insulin.  Essential hypertension: Well-controlled continue metoprolol and Imdur.  Hyperlipidemia: Continue Lipitor.  Depression: Continue Zoloft  Dysphagia: Speech was consulted recommended regular consistency diet.  Stage II sacral decubitus ulcer present on admission RN Pressure Injury Documentation: Pressure Injury 01/18/20 Sacrum Stage 2 -  Partial thickness loss of dermis presenting as a shallow open injury with a red, pink wound bed without slough. (Active)  01/18/20 2251  Location: Sacrum  Location Orientation:   Staging: Stage 2 -  Partial thickness loss of dermis presenting as a shallow open injury with a red, pink wound bed without slough.  Wound Description (Comments):   Present on Admission: Yes    Estimated body mass index is 18.36 kg/m as calculated from the following:   Height as of this encounter: 6' (1.829 m).   Weight as of this encounter: 61.4 kg.   DVT prophylaxis: eliquis Family Communication:none Status is: Inpatient  Remains inpatient appropriate because:Hemodynamically unstable   Dispo:  Patient From:     Planned  Disposition:    Expected discharge date: 07/01/2020  Medically stable for discharge:            Code Status:     Code Status Orders  (From admission, onward)         Start     Ordered   06/11/20 1712  Full code  Continuous        06/11/20 1719        Code Status History    Date Active Date Inactive Code Status Order ID Comments User Context   01/18/2020 2139 01/31/2020 1755 Full Code [id number]  Elizabeth Sauer Inpatient   01/18/2020 2139 01/18/2020 2139 Full Code [id number]  Elizabeth Sauer Inpatient   12/31/2019 1315 01/18/2020 2108 Full Code [id number]  Lequita Halt, MD ED   04/24/2019 0158 04/29/2019 1713 Full Code [id number]  Shela Leff, MD ED   10/28/2018 1748 11/04/2018 2111 Full Code [id number]  Luanne Bras, MD Inpatient   10/28/2018 1422 10/28/2018 1748 Full Code [id number]  Marliss Coots, PA-C ED   05/24/2018 0109 05/25/2018 1832 Full Code [id number]  Toy Baker, MD Inpatient   09/03/2013 1528 09/04/2013 1350 Full Code [id number]  Newt Minion, MD Inpatient   08/03/2013 1710 08/14/2013 1700 Full Code [id number]  Cathlyn Parsons, PA-C Inpatient   07/30/2013 1008 08/03/2013 1710 Full Code [id number]  Newt Minion, MD Inpatient   07/09/2013 1927 07/13/2013 2144 Full Code [id number]  Etta Quill, DO ED   05/31/2013 1602 06/11/2013 1703 Full Code [id number]  Cathlyn Parsons, PA-C Inpatient   05/24/2013 2116 05/31/2013 1602 Full Code [id number]  Theodis Blaze, MD ED   04/02/2013 1538 04/04/2013 1724 Full Code [id number]  Elmarie Shiley, MD Inpatient   Advance Care Planning Activity        IV Access:    Peripheral IV   Procedures and diagnostic studies:   DG CHEST PORT 1 VIEW  Result Date: 07/02/2020 CLINICAL DATA:  Chest pain. EXAM: PORTABLE CHEST 1 VIEW COMPARISON:  06/11/2020. FINDINGS: Unchanged cardiomegaly. Unchanged mediastinal contours with aortic atherosclerosis. Retrocardiac opacity likely combination of pleural fluid  and atelectasis. Improving interstitial opacities from prior exam. No pneumothorax. No acute osseous abnormalities are seen. IMPRESSION: 1. Retrocardiac opacity likely combination of pleural fluid and atelectasis. 2. Improving interstitial opacities from prior exam, suspect resolving pulmonary edema. 3. Unchanged cardiomegaly. Electronically Signed   By: Keith Rake M.D.   On: 07/02/2020 22:18     Medical Consultants:    None.   Subjective:    [name] having chest pain. Objective:    Vitals:   07/02/20 1627 07/02/20 2104 07/03/20 0559 07/03/20 0905  BP: (!) 147/65 (!) 163/59 (!) 150/65 (!) 149/64  Pulse: (!) 50 (!) 51 (!) 52 (!) 51  Resp: 18   18  Temp: 98 F (36.7 C) 98.9 F (37.2 C) 98.6 F (37 C) 98.1 F (36.7 C)  TempSrc:  Oral Oral Oral  SpO2: 97% 97% 96% 95%  Weight:      Height:       SpO2: 95 % O2 Flow Rate (L/min): 1 L/min   Intake/Output Summary (Last 24 hours) at 07/03/2020 0913 Last data filed at 07/03/2020 0604 Gross per 24 hour  Intake 840 ml  Output 0 ml  Net 840 ml   Filed Weights   07/01/20 0940 07/01/20 2051 07/02/20 0500  Weight: 60.8 kg 61.4 kg 61.4 kg    Exam: General exam: In no acute distress.  Respiratory system: Good air movement and clear to auscultation, chest pain is reproducible by palpation Cardiovascular system: S1 & S2 heard, RRR. No JVD. Gastrointestinal system: Abdomen is nondistended, soft and nontender.  Extremities: No pedal edema. Psychiatry: Judgement and insight appear normal. Mood & affect appropriate.  Data Reviewed:    Labs: Basic Metabolic Panel: Recent Labs  Lab 06/27/20 0736 06/29/20 0816 07/01/20 0719  NA 135 137 138  K 4.0 4.2 4.4  CL 104 104 103  CO2 16* 20* 21*  GLUCOSE 254* 120* 77  BUN 84* 77* 87*  CREATININE 6.43* 5.66* 5.32*  CALCIUM 7.3* 7.7* 7.7*  PHOS 5.6* 5.8* 6.0*   GFR Estimated Creatinine Clearance: 11.9 mL/min (A) (by C-G formula based on SCr of 5.32 mg/dL (H)). Liver  Function Tests: Recent Labs  Lab 06/27/20 0736 06/29/20 0816 07/01/20 0719  ALBUMIN 1.9* 1.9* 2.0*   No results for input(s): LIPASE, AMYLASE in the last 168 hours. No results for input(s): AMMONIA in the last 168 hours. Coagulation profile No results for input(s): INR, PROTIME in the last 168 hours. COVID-19 Labs  No results for input(s): DDIMER, FERRITIN, LDH, CRP in the last 72 hours.  Lab Results  Component Value Date   SARSCOV2NAA POSITIVE (A) 06/11/2020   SARSCOV2NAA NEGATIVE 12/31/2019   [place] NEGATIVE 10/12/2019   [place] NEGATIVE 04/23/2019    CBC: Recent Labs  Lab 06/27/20 0736 06/29/20 0817 07/01/20 0719  WBC 12.6* 10.7* 9.5  HGB 11.5* 10.4* 10.2*  HCT 38.6* 32.8* 33.7*  MCV 90.4 90.1 91.1  PLT 90* 85* 73*   Cardiac Enzymes: No results for input(s): CKTOTAL, CKMB, CKMBINDEX, TROPONINI in the last 168 hours. BNP (last 3 results) No results for input(s): PROBNP in the last 8760 hours. CBG: Recent Labs  Lab 07/02/20 0648 07/02/20 1128 07/02/20 1625 07/02/20 2105 07/03/20 0638  GLUCAP 102* 126* 154* 97 81   D-Dimer: No results for input(s): DDIMER in the last 72 hours. Hgb A1c: No results for input(s): HGBA1C in the last 72 hours. Lipid Profile: No results for input(s): CHOL, HDL, LDLCALC, TRIG, CHOLHDL, LDLDIRECT in the last 72 hours. Thyroid function studies: No results for input(s): TSH, T4TOTAL, T3FREE, THYROIDAB in the last 72 hours.  Invalid input(s): FREET3 Anemia work up: No results for input(s): VITAMINB12, FOLATE, FERRITIN, TIBC, IRON, RETICCTPCT in the last 72 hours. Sepsis Labs: Recent Labs  Lab 06/27/20 0736 06/29/20 0817 07/01/20 0719  WBC 12.6* 10.7* 9.5   Microbiology No results found for this or any previous visit (from the past 240 hour(s)).   Medications:   . apixaban  5 mg Oral BID  . aspirin  81 mg Oral Daily  . atorvastatin  10 mg Oral Q M,W,F  . Chlorhexidine Gluconate Cloth  6 each Topical Q0600   . doxercalciferol  2 mcg Intravenous Q T,Th,Sa-HD  . hydrALAZINE  10 mg Oral TID  . insulin aspart  0-15 Units Subcutaneous TID WC  . insulin aspart  0-5 Units Subcutaneous QHS  . insulin glargine  7 Units Subcutaneous Daily  . isosorbide mononitrate  15 mg Oral Daily  . lipase/protease/amylase  12,000 Units Oral TID AC  . magnesium chloride  2 tablet Oral BID  . metoprolol succinate  25 mg Oral Daily  . pantoprazole  80 mg Oral Daily  . predniSONE  5 mg Oral Q breakfast  . sertraline  50 mg Oral QHS  . sevelamer carbonate  800 mg Oral TID WC  . sodium bicarbonate  1,950 mg Oral  QID  . sodium chloride flush  3 mL Intravenous Q12H  . tacrolimus  2 mg Oral BID  . tamsulosin  0.4 mg Oral QPC supper   Continuous Infusions: . sodium chloride        LOS: 22 days   Charlynne Cousins  [facility]  07/03/2020, 9:13 AM

## 2020-07-03 NOTE — Progress Notes (Signed)
OT Cancellation Note  Patient Details Name: Paul [name] MRN: [id number] DOB: 1956-01-23   Cancelled Treatment:    Reason Eval/Treat Not Completed: Other (comment). Pt with chest pain overnight and reports concern of additional movement exacerbating this pain. Collaborated with pt/staff with hopes that HD tomorrow will assist in removing fluid and alleviating pain. EKG negative. Will follow-up for OT session as appropriate.   Layla Maw 07/03/2020, 8:35 AM

## 2020-07-03 NOTE — Plan of Care (Signed)
?  Problem: Education: ?Goal: Knowledge of disease or condition will improve ?Outcome: Progressing ?  ?Problem: Education: ?Goal: Understanding of medication regimen will improve ?Outcome: Progressing ?  ?

## 2020-07-04 DIAGNOSIS — B952 Enterococcus as the cause of diseases classified elsewhere: Secondary | ICD-10-CM | POA: Diagnosis not present

## 2020-07-04 DIAGNOSIS — R7881 Bacteremia: Secondary | ICD-10-CM | POA: Diagnosis not present

## 2020-07-04 LAB — RENAL FUNCTION PANEL
Albumin: 1.9 g/dL — ABNORMAL LOW (ref 3.5–5.0)
Anion gap: 11 (ref 5–15)
BUN: 55 mg/dL — ABNORMAL HIGH (ref 8–23)
CO2: 26 mmol/L (ref 22–32)
Calcium: 7.8 mg/dL — ABNORMAL LOW (ref 8.9–10.3)
Chloride: 101 mmol/L (ref 98–111)
Creatinine, Ser: 3.7 mg/dL — ABNORMAL HIGH (ref 0.61–1.24)
GFR, Estimated: 17 mL/min — ABNORMAL LOW (ref >60)
Glucose, Bld: 151 mg/dL — ABNORMAL HIGH (ref 70–99)
Phosphorus: 4.1 mg/dL (ref 2.5–4.6)
Potassium: 4.1 mmol/L (ref 3.5–5.1)
Sodium: 138 mmol/L (ref 135–145)

## 2020-07-04 LAB — CBC
HCT: 34.3 % — ABNORMAL LOW (ref 39.0–52.0)
Hemoglobin: 10.4 g/dL — ABNORMAL LOW (ref 13.0–17.0)
MCH: 27.8 pg (ref 26.0–34.0)
MCHC: 30.3 g/dL (ref 30.0–36.0)
MCV: 91.7 fL (ref 80.0–100.0)
Platelets: 65 10*3/uL — ABNORMAL LOW (ref 150–400)
RBC: 3.74 MIL/uL — ABNORMAL LOW (ref 4.22–5.81)
RDW: 19.6 % — ABNORMAL HIGH (ref 11.5–15.5)
WBC: 7.7 10*3/uL (ref 4.0–10.5)
nRBC: 0 % (ref 0.0–0.2)

## 2020-07-04 LAB — GLUCOSE, CAPILLARY
Glucose-Capillary: 178 mg/dL — ABNORMAL HIGH (ref 70–99)
Glucose-Capillary: 204 mg/dL — ABNORMAL HIGH (ref 70–99)
Glucose-Capillary: 118 mg/dL — ABNORMAL HIGH (ref 70–99)
Glucose-Capillary: 174 mg/dL — ABNORMAL HIGH (ref 70–99)

## 2020-07-04 MED ORDER — HEPARIN SODIUM (PORCINE) 1000 UNIT/ML DIALYSIS: 40.0000 [IU]/kg | INTRAMUSCULAR | Status: DC | PRN

## 2020-07-04 MED ORDER — DOXERCALCIFEROL 4 MCG/2ML IV SOLN
INTRAVENOUS | Status: AC
  Administered 2020-07-04: 2 ug via INTRAVENOUS
  Filled 2020-07-04: qty 2

## 2020-07-04 NOTE — Procedures (Signed)
Patient seen on Hemodialysis. BP (!) 163/68 (BP Location: Left Arm)   Pulse (!) 50   Temp 98.1 F (36.7 C)   Resp 16   Ht 6' (1.829 m)   Wt 61.4 kg   SpO2 95%   BMI 18.36 kg/m   QB 400, UF goal 2.5L Tolerating treatment with complaints of mild chest pain at this time (prior evaluation noted and likely [facility]).  Elmarie Shiley MD [facility]. Office # [phone] Pager # [phone] 8:52 AM

## 2020-07-04 NOTE — TOC Progression Note (Signed)
Transition of Care [facility]) - Progression Note    Patient Details  Name: Paul [name] MRN: [id number] Date of Birth: Apr 03, 1956  Transition of Care [facility]) CM/SW Contact  Bartholomew Crews, RN Phone Number: [phone] 07/04/2020, 4:26 PM  Clinical Narrative:     Acknowledging [facility] consult for SNF placement. SNF workup initiated 1/27. Continues to have no bed offers.   Expected Discharge Plan: Skilled Nursing Facility Barriers to Discharge: Continued Medical Work up  Expected Discharge Plan and Services Expected Discharge Plan: [place] In-house Referral: NA Discharge Planning Services: CM Consult Post Acute Care Choice: [place] Living arrangements for the past 2 months: Single Family Home                 DME Arranged: N/A DME Agency: NA       HH Arranged: NA HH Agency: NA         Social Determinants of Health (SDOH) Interventions    Readmission Risk Interventions Readmission Risk Prevention Plan 01/18/2020 04/26/2019 11/04/2018  Transportation Screening Complete Complete Complete  Medication Review Press photographer) Complete Complete Complete  PCP or Specialist appointment within 3-5 days of discharge Complete Not Complete Complete  PCP/Specialist Appt Not Complete comments - pending medical stability -  [place] or Home Care Consult Complete Complete Complete  SW Recovery Care/Counseling Consult Complete Complete Complete  Palliative Care Screening Not Applicable Not Complete Not Applicable  Comments - may be appropriate, will ask MD -  [place] Not Applicable Patient Refused Not Applicable  Some recent data might be hidden

## 2020-07-04 NOTE — Progress Notes (Signed)
[facility] PROGRESS NOTE    Progress Note  STRYKER VEASEY  DQQ:[id number] DOB: 10/08/1955 DOA: 06/11/2020 PCP: [name], MD     Brief Narrative:   Paul [name] is an 65 y.o. male past medical history of atrial fibrillation on Eliquis, end-stage renal disease with a failed transplant on dialysis Tuesday Thursdays and Saturdays, diabetes mellitus type 2, chronic diastolic heart failure comes into the ED secondary to dyspnea in the setting of missing dialysis found to be Covid positive with also Enterococcus bacteremia.  2D echo was done that showed severely reduced EF.  Evaluated by CIR but did not have a bed, awaiting for a bed in Inpatient rehab.  Is medically stable to be transferred to facility  Significant Events: 06/11/2020 admission  Significant studies: 06/11/2020 chest x-ray showed diffuse bilateral infiltrates within the mid lower lungs small left pleural effusion. 06/12/2020 CT of the head showed no acute intracranial hemorrhage or infarct, there is a remote lacunar infarct to the left hemisphere. 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 06/13/2020 transthoracic echo showed an EF of 25% no evidence of vegetation.[name]  Antibiotics: None  Microbiology data: Blood culture:  Procedures: None  Assessment/Plan:   Acute respiratory failure with hypoxia likely due to fluid overload: Resolved with dialysis.  Sepsis secondarily to bacteremia due to Enterococcus Likely urinary source, 2D echo as above no evidence of vegetation. Discussed with ID recommended 2 weeks of IV antibiotics, he has completed his course of antibiotics in house.  COVID-19 pneumonia: Treated for 5 days weaned off steroids. He has completed his isolation.  Acute metabolic encephalopathy: Likely due to infectious etiology now resolved.  End-stage renal disease on  hemodialysis: Further management per renal.  Paroxysmal atrial fibrillation with RVR/tachybradycardia syndrome: Had an episode of paroxysmal atrial fibrillation overnight now back in sinus rhythm continue metoprolol and Eliquis.  Atypical chest pain: Likely musculoskeletal reproducible by palpation, Twelve-lead EKG shows sinus bradycardia with no ST segment changes. Chest x-ray showed no infiltrates or fractures. Cardiac biomarkers have basically remained flat chest slightly elevated but flat the elevation is likely due to chronic renal disease.  Acute on chronic systolic heart failure: With an EF of 25% cardiology was consulted recommended to continue hydralazine, Imdur. His metoprolol was resumed he continues to be sinus bradycardia in the 55 to 60s.  Diabetes mellitus type II: Decrease long-acting insulin as blood glucose was low this morning, continue sliding scale insulin.  Essential hypertension: Well-controlled continue metoprolol and Imdur.  Hyperlipidemia: Continue Lipitor.  Depression: Continue Zoloft  Dysphagia: Speech was consulted recommended regular consistency diet.  Stage II sacral decubitus ulcer present on admission RN Pressure Injury Documentation: Pressure Injury 01/18/20 Sacrum Stage 2 -  Partial thickness loss of dermis presenting as a shallow open injury with a red, pink wound bed without slough. (Active)  01/18/20 2251  Location: Sacrum  Location Orientation:   Staging: Stage 2 -  Partial thickness loss of dermis presenting as a shallow open injury with a red, pink wound bed without slough.  Wound Description (Comments):   Present on Admission: Yes    Estimated body mass index is 19.73 kg/m as calculated from the following:   Height as of this encounter: 6' (1.829 m).   Weight as of this encounter: 66 kg.   DVT prophylaxis: eliquis Family Communication:none Status is: Inpatient  Remains inpatient appropriate because:Hemodynamically  unstable   Dispo:  Patient From:    Planned Disposition: Inpatient Rehab  Expected discharge date: 07/05/2020  Medically stable for discharge:            Code Status:     Code Status Orders  (From admission, onward)         Start     Ordered   06/11/20 1712  Full code  Continuous        06/11/20 1719        Code Status History    Date Active Date Inactive Code Status Order ID Comments User Context   01/18/2020 2139 01/31/2020 1755 Full Code [id number]  Elizabeth Sauer Inpatient   01/18/2020 2139 01/18/2020 2139 Full Code [id number]  Elizabeth Sauer Inpatient   12/31/2019 1315 01/18/2020 2108 Full Code [id number]  Lequita Halt, MD ED   04/24/2019 0158 04/29/2019 1713 Full Code [id number]  Shela Leff, MD ED   10/28/2018 1748 11/04/2018 2111 Full Code [id number]  Luanne Bras, MD Inpatient   10/28/2018 1422 10/28/2018 1748 Full Code [id number]  Marliss Coots, PA-C ED   05/24/2018 0109 05/25/2018 1832 Full Code [id number]  Toy Baker, MD Inpatient   09/03/2013 1528 09/04/2013 1350 Full Code [id number]  Newt Minion, MD Inpatient   08/03/2013 1710 08/14/2013 1700 Full Code [id number]  Cathlyn Parsons, PA-C Inpatient   07/30/2013 1008 08/03/2013 1710 Full Code [id number]  Newt Minion, MD Inpatient   07/09/2013 1927 07/13/2013 2144 Full Code [id number]  Etta Quill, DO ED   05/31/2013 1602 06/11/2013 1703 Full Code [id number]  Cathlyn Parsons, PA-C Inpatient   05/24/2013 2116 05/31/2013 1602 Full Code [id number]  Theodis Blaze, MD ED   04/02/2013 1538 04/04/2013 1724 Full Code [id number]  Elmarie Shiley, MD Inpatient   Advance Care Planning Activity        IV Access:    Peripheral IV   Procedures and diagnostic studies:   DG CHEST PORT 1 VIEW  Result Date: 07/02/2020 CLINICAL DATA:  Chest pain. EXAM: PORTABLE CHEST 1 VIEW COMPARISON:  06/11/2020. FINDINGS: Unchanged cardiomegaly. Unchanged mediastinal contours with aortic atherosclerosis.  Retrocardiac opacity likely combination of pleural fluid and atelectasis. Improving interstitial opacities from prior exam. No pneumothorax. No acute osseous abnormalities are seen. IMPRESSION: 1. Retrocardiac opacity likely combination of pleural fluid and atelectasis. 2. Improving interstitial opacities from prior exam, suspect resolving pulmonary edema. 3. Unchanged cardiomegaly. Electronically Signed   By: Keith Rake M.D.   On: 07/02/2020 22:18     Medical Consultants:    None.   Subjective:    Paul [name] having chest pain. Objective:    Vitals:   07/04/20 0835 07/04/20 0840 07/04/20 0900 07/04/20 0930  BP: (!) 189/64 (!) 172/65 (!) 178/67 (!) 151/66  Pulse: (!) 53 (!) 53    Resp: 15  19 15   Temp: 99.8 F (37.7 C)     TempSrc: Oral     SpO2: 96%     Weight: 66 kg     Height:       SpO2: 96 % O2 Flow Rate (L/min): 1 L/min   Intake/Output Summary (Last 24 hours) at 07/04/2020 1010 Last data filed at 07/04/2020 0811 Gross per 24 hour  Intake 1353 ml  Output 0 ml  Net 1353 ml   Filed Weights   07/01/20 2051 07/02/20 0500 07/04/20 0835  Weight: 61.4 kg 61.4 kg 66 kg    Exam: General exam: In no acute distress. Respiratory system: Good air  movement and clear to auscultation, chest pain is reproducible by palpation Cardiovascular system: S1 & S2 heard, RRR. No JVD. Gastrointestinal system: Abdomen is nondistended, soft and nontender.  Extremities: No pedal edema. Psychiatry: Judgement and insight appear normal. Mood & affect appropriate.  Data Reviewed:    Labs: Basic Metabolic Panel: Recent Labs  Lab 06/29/20 0816 07/01/20 0719  NA 137 138  K 4.2 4.4  CL 104 103  CO2 20* 21*  GLUCOSE 120* 77  BUN 77* 87*  CREATININE 5.66* 5.32*  CALCIUM 7.7* 7.7*  PHOS 5.8* 6.0*   GFR Estimated Creatinine Clearance: 12.8 mL/min (A) (by C-G formula based on SCr of 5.32 mg/dL (H)). Liver Function Tests: Recent Labs  Lab 06/29/20 0816 07/01/20 0719   ALBUMIN 1.9* 2.0*   No results for input(s): LIPASE, AMYLASE in the last 168 hours. No results for input(s): AMMONIA in the last 168 hours. Coagulation profile No results for input(s): INR, PROTIME in the last 168 hours. COVID-19 Labs  No results for input(s): DDIMER, FERRITIN, LDH, CRP in the last 72 hours.  Lab Results  Component Value Date   SARSCOV2NAA POSITIVE (A) 06/11/2020   SARSCOV2NAA NEGATIVE 12/31/2019   SARSCOV2NAA NEGATIVE 10/12/2019   [place] NEGATIVE 04/23/2019    CBC: Recent Labs  Lab 06/29/20 0817 07/01/20 0719  WBC 10.7* 9.5  HGB 10.4* 10.2*  HCT 32.8* 33.7*  MCV 90.1 91.1  PLT 85* 73*   Cardiac Enzymes: No results for input(s): CKTOTAL, CKMB, CKMBINDEX, TROPONINI in the last 168 hours. BNP (last 3 results) No results for input(s): PROBNP in the last 8760 hours. CBG: Recent Labs  Lab 07/03/20 0638 07/03/20 1129 07/03/20 1616 07/03/20 2053 07/04/20 0638  GLUCAP 81 111* 142* 242* 178*   D-Dimer: No results for input(s): DDIMER in the last 72 hours. Hgb A1c: No results for input(s): HGBA1C in the last 72 hours. Lipid Profile: No results for input(s): CHOL, HDL, LDLCALC, TRIG, CHOLHDL, LDLDIRECT in the last 72 hours. Thyroid function studies: No results for input(s): TSH, T4TOTAL, T3FREE, THYROIDAB in the last 72 hours.  Invalid input(s): FREET3 Anemia work up: No results for input(s): VITAMINB12, FOLATE, FERRITIN, TIBC, IRON, RETICCTPCT in the last 72 hours. Sepsis Labs: Recent Labs  Lab 06/29/20 0817 07/01/20 0719  WBC 10.7* 9.5   Microbiology No results found for this or any previous visit (from the past 240 hour(s)).   Medications:   . apixaban  5 mg Oral BID  . aspirin  81 mg Oral Daily  . atorvastatin  10 mg Oral Q M,W,F  . Chlorhexidine Gluconate Cloth  6 each Topical Q0600  . doxercalciferol  2 mcg Intravenous Q T,Th,Sa-HD  . hydrALAZINE  10 mg Oral TID  . insulin aspart  0-15 Units Subcutaneous TID WC  . insulin  aspart  0-5 Units Subcutaneous QHS  . insulin glargine  7 Units Subcutaneous Daily  . isosorbide mononitrate  15 mg Oral Daily  . lipase/protease/amylase  12,000 Units Oral TID AC  . magnesium chloride  2 tablet Oral BID  . metoprolol succinate  25 mg Oral Daily  . pantoprazole  80 mg Oral Daily  . predniSONE  5 mg Oral Q breakfast  . sertraline  50 mg Oral QHS  . sevelamer carbonate  800 mg Oral TID WC  . sodium bicarbonate  1,950 mg Oral QID  . sodium chloride flush  3 mL Intravenous Q12H  . tacrolimus  2 mg Oral BID  . tamsulosin  0.4 mg Oral QPC supper  Continuous Infusions: . sodium chloride        LOS: 23 days   Charlynne Cousins  [facility]  07/04/2020, 10:10 AM

## 2020-07-05 DIAGNOSIS — B952 Enterococcus as the cause of diseases classified elsewhere: Secondary | ICD-10-CM | POA: Diagnosis not present

## 2020-07-05 DIAGNOSIS — E1159 Type 2 diabetes mellitus with other circulatory complications: Secondary | ICD-10-CM | POA: Diagnosis not present

## 2020-07-05 DIAGNOSIS — R7881 Bacteremia: Secondary | ICD-10-CM | POA: Diagnosis not present

## 2020-07-05 DIAGNOSIS — U071 COVID-19: Secondary | ICD-10-CM | POA: Diagnosis not present

## 2020-07-05 LAB — GLUCOSE, CAPILLARY
Glucose-Capillary: 96 mg/dL (ref 70–99)
Glucose-Capillary: 107 mg/dL — ABNORMAL HIGH (ref 70–99)
Glucose-Capillary: 153 mg/dL — ABNORMAL HIGH (ref 70–99)
Glucose-Capillary: 122 mg/dL — ABNORMAL HIGH (ref 70–99)

## 2020-07-05 MED ORDER — METHOCARBAMOL 500 MG PO TABS
500.0000 mg | ORAL_TABLET | Freq: Once | ORAL | Status: AC
  Administered 2020-07-05: 500 mg via ORAL
  Filled 2020-07-05: qty 1

## 2020-07-05 MED ORDER — NEPRO/CARBSTEADY PO LIQD
237.0000 mL | Freq: Three times a day (TID) | ORAL | Status: DC
  Administered 2020-07-05 – 2020-07-20 (×34): 237 mL via ORAL

## 2020-07-05 NOTE — Progress Notes (Signed)
Occupational Therapy Treatment Patient Details Name: Paul [name] MRN: [id number] DOB: 1956-01-24 Today's Date: 07/05/2020    History of present illness 65 yo male presenting to ED via EMS after missing dialysis and weakness with COVID-19 +. Off COVID precautions on 2/3. PMH including HTN, DM2, renal transplant, CKD st IV, calciphylaxis, A-Fib, bil. TTAs, bilateral BKA with prosthesis,and TIA.   OT comments  Pt making gradual progress towards OT goals this session. Pt continues to present with impaired sitting balance, increased pain and decreased activity tolerance. Pt currently requires up to MAX A for LB Dressing this session and up to MOD A +2 to power into standing from EOB. Pt unable to take steps this session d/t chest pain and required x2 attempts to come into full stand. Pt also required MIN A for sitting balance EOB to don prosthetics. Pt would continue to benefit from skilled occupational therapy while admitted and after d/c to address the below listed limitations in order to improve overall functional mobility and facilitate independence with BADL participation. DC plan remains appropriate, will follow acutely per POC.     Follow Up Recommendations  CIR;Supervision/Assistance - 24 hour    Equipment Recommendations  None recommended by OT;Other (comment) (well equipped)    Recommendations for Other Services      Precautions / Restrictions Precautions Precautions: Fall;Other (comment) Precaution Comments: B BKA with prosthetics in room now, mod fall Restrictions Weight Bearing Restrictions: No       Mobility Bed Mobility Overal bed mobility: Needs Assistance Bed Mobility: Rolling;Sidelying to Sit;Sit to Sidelying Rolling: Modified independent (Device/Increase time) Sidelying to sit: Mod assist;HOB elevated     Sit to sidelying: Min assist General bed mobility comments: MOD A to elevate pts trunk into sitting from sidelying, light MIN A to lower pts trunk safely back  down into sidelying  Transfers Overall transfer level: Needs assistance Equipment used: Rolling walker (2 wheeled) Transfers: Sit to/from Stand Sit to Stand: Mod assist;+2 physical assistance;From elevated surface         General transfer comment: attempted sit<>stand x2 from EOB with pt unable to come into full stand, on third attempt with increased assist pt able to rise into standing with MOD A +2 with heavy assistance wtih elevating trunk, pt only able to stand ~ 30 secs before fatiguing and reporting too much pain in chest    Balance Overall balance assessment: Needs assistance Sitting-balance support: Feet unsupported;Single extremity supported Sitting balance-Leahy Scale: Poor Sitting balance - Comments: pt needed increased assist for dynamic balance this session when donning prosthetics from EOB, at least MIN A   Standing balance support: Bilateral upper extremity supported Standing balance-Leahy Scale: Poor Standing balance comment: Needed B UE support in standing and external assist                           ADL either performed or assessed with clinical judgement   ADL Overall ADL's : Needs assistance/impaired                     Lower Body Dressing: Maximal assistance;Sit to/from stand Lower Body Dressing Details (indicate cue type and reason): pt able to roll R<>L to thread pants from bed level although pt requried MAX A to pull pants up to waist line in standing d/t pain in chest   Toilet Transfer Details (indicate cue type and reason): unable to transfer this session, x3 sit<>stands Toileting- Clothing Manipulation and Hygiene: Total  assistance;Bed level Toileting - Clothing Manipulation Details (indicate cue type and reason): total A for pericare from bed level after getting pt off bed pan     Functional mobility during ADLs: Moderate assistance;+2 for physical assistance (sit<>stand only from EOB with RW) General ADL Comments: pt limited by  chest pain this session, needing MOD A +2 for sit<>stand and up to MAX A for LB dressign tasks. pt required more assist for sitting balance this session needing up to MIN A     Vision       Perception     Praxis      Cognition Arousal/Alertness: Awake/alert Behavior During Therapy: WFL for tasks assessed/performed Overall Cognitive Status: No family/caregiver present to determine baseline cognitive functioning Area of Impairment: Problem solving;Safety/judgement                         Safety/Judgement: Decreased awareness of safety;Decreased awareness of deficits   Problem Solving: Slow processing;Requires verbal cues General Comments: pt required increased cues to problem solve mobility tasks as pt lacking awareness related to safety concerns noted by pt attempting to don prosthetic too close to EOB nearly losing pts balance        Exercises     Shoulder Instructions       General Comments VSS    Pertinent Vitals/ Pain       Pain Assessment: 0-10 Pain Score: 4  (ranging frmo 2-4 during session) Pain Location: Chest pain Pain Descriptors / Indicators: Sharp;Shooting;Grimacing Pain Intervention(s): Monitored during session;Limited activity within patient's tolerance  Home Living                                          Prior Functioning/Environment              Frequency  Min 2X/week        Progress Toward Goals  OT Goals(current goals can now be found in the care plan section)  Progress towards OT goals: Progressing toward goals  Acute Rehab OT Goals Patient Stated Goal: try to get into standing OT Goal Formulation: With patient Time For Goal Achievement: 07/12/20 Potential to Achieve Goals: [place] Discharge plan remains appropriate;Frequency remains appropriate    Co-evaluation                 AM-PAC OT "6 Clicks" Daily Activity     Outcome Measure   Help from another person eating meals?: None Help  from another person taking care of personal grooming?: A Little Help from another person toileting, which includes using toliet, bedpan, or urinal?: A Lot Help from another person bathing (including washing, rinsing, drying)?: A Lot Help from another person to put on and taking off regular upper body clothing?: A Little Help from another person to put on and taking off regular lower body clothing?: A Lot 6 Click Score: 16    End of Session Equipment Utilized During Treatment: Gait belt;Rolling walker  OT Visit Diagnosis: Other abnormalities of gait and mobility (R26.89);Muscle weakness (generalized) (M62.81)   Activity Tolerance Patient limited by pain   Patient Left in bed;with call bell/phone within reach;with bed alarm set   Nurse Communication Mobility status;Other (comment) (limited session d/t chest pain)        Time: 4128-7867 OT Time Calculation (min): 25 min  Charges: OT General Charges $OT Visit: 1 Visit OT  Treatments $Self Care/Home Management : 23-37 mins Harley Alto., COTA/L Acute Rehabilitation Services [phone]    Precious Haws 07/05/2020, 9:31 AM

## 2020-07-05 NOTE — Progress Notes (Addendum)
Inpatient Rehabilitation Admissions Coordinator  Patient has been unable to ambulate with therapy since 2/11 due to chest pain. I met with patient yesterday at bedside. I await ability to tolerate intensive therapies before pursuing insurance approval for a possible CIR admit. Other rehab options, including home discharge recommended .  Danne Baxter, RN, MSN Rehab Admissions Coordinator [phone] 07/05/2020 8:28 AM

## 2020-07-05 NOTE — Progress Notes (Signed)
Physical Therapy Treatment Patient Details Name: Paul [name] MRN: [id number] DOB: 09-Jul-1955 Today's Date: 07/05/2020    History of Present Illness 65 yo male presenting to ED via EMS after missing dialysis and weakness with COVID-19 +. Off COVID precautions on 2/3. PMH including HTN, DM2, renal transplant, CKD st IV, calciphylaxis, A-Fib, bil. TTAs, bilateral BKA with prosthesis,and TIA.    PT Comments    Pt received in bed, cooperative but reporting 4/10 chest pain. Completed muscle energy, contract-relax of pectoralis muscle with no change in symptoms. Mod A needed for side-lying to sit and mod Ax2 for sit to stand with RW. Pt able to get to standing on first attempt but with increased assistance and cueing to bring trunk forward into upright position. Unable to march in place or ambulate due to fatigue and chest pain. Exercises completed on EOB to work on abdominal strengthening for less reliance on UE in sitting. Pt educated that he would need to tolerate 3+ hours of combined therapy for CIR. Pt stated that he feels he would be unable to do so at this time due to chest pain. Pt agreeable to SNF. Left in bed with all needs met and call bell within reach.     Follow Up Recommendations  SNF     Equipment Recommendations  Rolling walker with 5" wheels;3in1 (PT);Wheelchair (measurements PT);Wheelchair cushion (measurements PT) (TBD)    Recommendations for Other Services Rehab consult     Precautions / Restrictions Precautions Precautions: Fall;Other (comment) Precaution Comments: B BKA with prosthetics in room now, mod fall    Mobility  Bed Mobility   Bed Mobility: Rolling;Sidelying to Sit;Sit to Supine Rolling: Modified independent (Device/Increase time) Sidelying to sit: Mod assist;HOB elevated   Sit to supine: Min guard   General bed mobility comments: Mod A for supine to sit with HOB elevated, needed assistance bringing trunk to upright position    Transfers Overall  transfer level: Needs assistance Equipment used: Rolling walker (2 wheeled) Transfers: Sit to/from Stand Sit to Stand: Mod assist;+2 physical assistance;From elevated surface         General transfer comment: Able to get to stand on first attempt but with mod Ax2, verbal and tactile cueing to come into full upright position and elevate trunk, unable to march in standing, able to rotate LE but unable to lift LE off ground, able to stand float hands and perform static standing with RW for ~10 seconds before needing to sit due to chest pain and fatigue  Ambulation/Gait             General Gait Details: Unable to complete due to fatigue   Stairs             Wheelchair Mobility    Modified Rankin (Stroke Patients Only)       Balance Overall balance assessment: Needs assistance Sitting-balance support: Feet unsupported;Single extremity supported Sitting balance-Leahy Scale: Poor Sitting balance - Comments: Reliant on UE for sitting > 1 min, min guard while donning/doffing prosthetics   Standing balance support: Bilateral upper extremity supported Standing balance-Leahy Scale: Poor Standing balance comment: Reliant on B UE support                            Cognition Arousal/Alertness: Awake/alert Behavior During Therapy: WFL for tasks assessed/performed Overall Cognitive Status: No family/caregiver present to determine baseline cognitive functioning Area of Impairment: Problem solving;Safety/judgement  Safety/Judgement: Decreased awareness of safety;Decreased awareness of deficits   Problem Solving: Slow processing;Requires verbal cues General Comments: Increased time to respond to cues and commands, but able to consistently follow commands      Exercises Other Exercises Other Exercises: Seated marching with no UE support x10 B, Seated abdominal rotations x10 each side with hold object, LAQ on EOB with no UE support 10  B (Needs rest breaks & reliance on B UE support between exercises)    General Comments General comments (skin integrity, edema, etc.): Muscle energy, contract-relax for pectoralis muscle completed with no change in symptoms      Pertinent Vitals/Pain Pain Assessment: 0-10 Pain Score: 4  Pain Location: Chest pain Pain Descriptors / Indicators: Sharp;Shooting;Grimacing Pain Intervention(s): Monitored during session;Limited activity within patient's tolerance    Home Living                      Prior Function            PT Goals (current goals can now be found in the care plan section) Acute Rehab PT Goals Patient Stated Goal: try to get into standing    Frequency    Min 3X/week      PT Plan Discharge plan needs to be updated    Co-evaluation              AM-PAC PT "6 Clicks" Mobility   Outcome Measure  Help needed turning from your back to your side while in a flat bed without using bedrails?: None Help needed moving from lying on your back to sitting on the side of a flat bed without using bedrails?: A Lot Help needed moving to and from a bed to a chair (including a wheelchair)?: A Lot Help needed standing up from a chair using your arms (e.g., wheelchair or bedside chair)?: A Lot Help needed to walk in hospital room?: A Lot Help needed climbing 3-5 steps with a railing? : A Lot 6 Click Score: 14    End of Session Equipment Utilized During Treatment: Gait belt Activity Tolerance: Patient limited by fatigue;Patient limited by pain;Patient tolerated treatment well Patient left: with call bell/phone within reach;in bed Nurse Communication: Mobility status PT Visit Diagnosis: Muscle weakness (generalized) (M62.81);Other abnormalities of gait and mobility (R26.89)     Time:  -     Charges:                        Rosita Kea, SPT

## 2020-07-05 NOTE — Progress Notes (Addendum)
Inpatient Rehabilitation Admissions Coordinator  Patient currently not at a level to tolerate the intensity required of an inpatient rehab admit. I met with patient at bedside yesterday and today and he is aware. Other rehab venues need to continue to be pursued. Acute team and TOC made aware.  Danne Baxter, RN, MSN Rehab Admissions Coordinator [phone] 07/05/2020 3:17 PM

## 2020-07-05 NOTE — Progress Notes (Signed)
[facility] Progress Note   Subjective:     Paul [name] was seen and examined at bedside today. He denies chest pain while lying still. He says the chest pain worsens with movement. No concerns/compliants at this time. Denies shortness of breath, cough, N/V/D.  Objective Vitals:   07/04/20 2045 07/05/20 0432 07/05/20 0832 07/05/20 0910  BP: (!) 161/64 (!) 151/63 (!) 154/60 (!) 152/67  Pulse: (!) 47 (!) 52 (!) 52 (!) 57  Resp: 16 16  18   Temp: 98.1 F (36.7 C) 98.7 F (37.1 C)  98.1 F (36.7 C)  TempSrc: Oral Oral    SpO2: 98% 95%  93%  Weight:      Height:       Physical Exam General: Appears comfortable, in no acute distress Heart: Murmur noted; S1 and S2. Lungs: On RA; Lungs clear throughout w/o wheezing, rhonchi, and rales Abdomen: Soft, round, non-tender, non-distended, active bowel sounds Extremities: No edema bilateral lower extremities Dialysis Access: R AVF (+) Bruit/Thrill  Filed Weights   07/02/20 0500 07/04/20 0835 07/04/20 1110  Weight: 61.4 kg 66 kg 63.5 kg    Intake/Output Summary (Last 24 hours) at 07/05/2020 1140 Last data filed at 07/05/2020 0836 Gross per 24 hour  Intake 1206 ml  Output 0 ml  Net 1206 ml    Additional Objective Labs: Basic Metabolic Panel: Recent Labs  Lab 06/29/20 0816 07/01/20 0719 07/04/20 1748  NA 137 138 138  K 4.2 4.4 4.1  CL 104 103 101  CO2 20* 21* 26  GLUCOSE 120* 77 151*  BUN 77* 87* 55*  CREATININE 5.66* 5.32* 3.70*  CALCIUM 7.7* 7.7* 7.8*  PHOS 5.8* 6.0* 4.1   Liver Function Tests: Recent Labs  Lab 06/29/20 0816 07/01/20 0719 07/04/20 1748  ALBUMIN 1.9* 2.0* 1.9*   No results for input(s): LIPASE, AMYLASE in the last 168 hours. CBC: Recent Labs  Lab 06/29/20 0817 07/01/20 0719 07/04/20 1748  WBC 10.7* 9.5 7.7  HGB 10.4* 10.2* 10.4*  HCT 32.8* 33.7* 34.3*  MCV 90.1 91.1 91.7  PLT 85* 73* 65*   Blood Culture    Component Value Date/Time   SDES BLOOD LEFT HAND 06/13/2020 0803    SDES BLOOD LEFT HAND 06/13/2020 0803   SPECREQUEST  06/13/2020 0803    BOTTLES DRAWN AEROBIC AND ANAEROBIC Blood Culture adequate volume   SPECREQUEST  06/13/2020 0803    BOTTLES DRAWN AEROBIC AND ANAEROBIC Blood Culture adequate volume   CULT  06/13/2020 0803    NO GROWTH 5 DAYS Performed at [facility] [address] [place], [place] [address]    CULT  06/13/2020 0803    NO GROWTH 5 DAYS Performed at [facility] [address], [place], [place] [address]    REPTSTATUS 06/18/2020 FINAL 06/13/2020 0803   REPTSTATUS 06/18/2020 FINAL 06/13/2020 0803    Cardiac Enzymes: No results for input(s): CKTOTAL, CKMB, CKMBINDEX, TROPONINI in the last 168 hours. CBG: Recent Labs  Lab 07/04/20 1135 07/04/20 1641 07/04/20 2045 07/05/20 0638 07/05/20 1136  GLUCAP 204* 118* 174* 96 107*   Iron Studies: No results for input(s): IRON, TIBC, TRANSFERRIN, FERRITIN in the last 72 hours. Lab Results  Component Value Date   INR 1.2 01/04/2020   INR 1.2 10/28/2018   INR 1.25 06/14/2013   Medications: . sodium chloride     . apixaban  5 mg Oral BID  . aspirin  81 mg Oral Daily  . atorvastatin  10 mg Oral Q  M,W,F  . Chlorhexidine Gluconate Cloth  6 each Topical Q0600  . doxercalciferol  2 mcg Intravenous Q T,Th,Sa-HD  . hydrALAZINE  10 mg Oral TID  . insulin aspart  0-15 Units Subcutaneous TID WC  . insulin aspart  0-5 Units Subcutaneous QHS  . insulin glargine  7 Units Subcutaneous Daily  . isosorbide mononitrate  15 mg Oral Daily  . lipase/protease/amylase  12,000 Units Oral TID AC  . magnesium chloride  2 tablet Oral BID  . metoprolol succinate  25 mg Oral Daily  . pantoprazole  80 mg Oral Daily  . predniSONE  5 mg Oral Q breakfast  . sertraline  50 mg Oral QHS  . sevelamer carbonate  800 mg Oral TID WC  . sodium bicarbonate  1,950 mg Oral QID  . sodium chloride flush  3 mL Intravenous Q12H  . tacrolimus  2 mg Oral BID  . tamsulosin  0.4 mg Oral QPC supper     Dialysis Orders:  Assessment/Plan: 1. COVID-19/PNA: Tested (+) 06/11/20 currently out of window. His hospitalization has been prolonged d/t de-conditioning. Working with PT/OT. Anticipated d/c to inpatient rehab. 2. ESRD: On HD TTS, will place HD orders for tomorrow 3. Anemia of CKD: Hgb 10.4; Continue Darbepoetin IV weekly 4. Secondary hyperparathyroidism - Corrected Ca 9.48, Continue ADRA and binders. 5. HTN/volume - Reveiwed BP trend sys 150/60s. No signs for volume overload at this time. Scheduled for HD tomorrow. Continue Hydralazine, Metoprolol, and Imdur. 6. Nutrition - Continue renal diet; Albumin 1.9, will order nepro supplement.  Tobie Poet, NP [facility] 07/05/2020,11:40 AM  LOS: 24 days

## 2020-07-05 NOTE — Progress Notes (Signed)
[facility]  PROGRESS NOTE  Paul [name] YCX:[id number] DOB: [birth year] DOA: 06/11/2020 PCP: [name], MD   Brief HPI:   65 year old male with past medical history of atrial fibrillation on Eliquis, ESRD with failed renal transplant, on dialysis, TTS, diabetes mellitus type 2, chronic diastolic heart failure came to ED with complaints of dyspnea in setting of missed dialysis.  Was found to have Covid positive, also Enterococcus bacteremia.  2D echo was done which showed severely reduced EF.  CIR evaluation done, patient has been accepted for CIR but no beds available.    Subjective   Patient seen and examined, denies any new complaints.  He was diagnosed with musculoskeletal chest pain.  Still has intermittent chest pain.  Worse on deep breathing.   Assessment/Plan:     1. Acute respiratory failure with hypoxemia-secondary to fluid overload, resolved with hemodialysis. 2. Sepsis due to bacteremia/Enterococcus-patient had Enterococcus bacteremia likely urinary source.  2D echo showed no evidence of vegetation.  Dr. Olevia Bowens discussed with ID who recommended 2 weeks of IV antibiotics.  Antibiotics course has been completed in the hospital.   3. Acute metabolic encephalopathy-resolved likely from sepsis as above. 4. COVID-19 pneumonia-he was treated with remdesivir for 5 days, steroids weaned off.  Patient has completed isolation.  Symptoms have resolved. 5. ESRD on hemodialysis, failed renal transplant.  Patient on hemodialysis Tuesday Thursday Saturday.  Continue sevelamer, sodium bicarbonate, tacrolimus.  Nephrology following. 6. Diabetes mellitus type 2-CBG well controlled, continue Lantus 7 units subcu daily, sliding scale insulin with NovoLog. 7. Dysphagia-speech therapy was consulted, patient is on regular consistency diet. 8. Depression-continue Zoloft 9. Hyperlipidemia-continue Lipitor 10. Hypertension-blood pressure controlled on metoprolol, Imdur.     COVID-19  Labs  No results for input(s): DDIMER, FERRITIN, LDH, CRP in the last 72 hours.  Lab Results  Component Value Date   SARSCOV2NAA POSITIVE (A) 06/11/2020   [place] NEGATIVE 12/31/2019   [place] NEGATIVE 10/12/2019   [place] NEGATIVE 04/23/2019     Scheduled medications:   . apixaban  5 mg Oral BID  . aspirin  81 mg Oral Daily  . atorvastatin  10 mg Oral Q M,W,F  . Chlorhexidine Gluconate Cloth  6 each Topical Q0600  . doxercalciferol  2 mcg Intravenous Q T,Th,Sa-HD  . feeding supplement (NEPRO CARB STEADY)  237 mL Oral TID WC  . hydrALAZINE  10 mg Oral TID  . insulin aspart  0-15 Units Subcutaneous TID WC  . insulin aspart  0-5 Units Subcutaneous QHS  . insulin glargine  7 Units Subcutaneous Daily  . isosorbide mononitrate  15 mg Oral Daily  . lipase/protease/amylase  12,000 Units Oral TID AC  . magnesium chloride  2 tablet Oral BID  . metoprolol succinate  25 mg Oral Daily  . pantoprazole  80 mg Oral Daily  . predniSONE  5 mg Oral Q breakfast  . sertraline  50 mg Oral QHS  . sevelamer carbonate  800 mg Oral TID WC  . sodium bicarbonate  1,950 mg Oral QID  . sodium chloride flush  3 mL Intravenous Q12H  . tacrolimus  2 mg Oral BID  . tamsulosin  0.4 mg Oral QPC supper         CBG: Recent Labs  Lab 07/04/20 1135 07/04/20 1641 07/04/20 2045 07/05/20 0638 07/05/20 1136  GLUCAP 204* 118* 174* 96 107*    SpO2: 93 % O2 Flow Rate (L/min): 1 L/min    CBC: Recent Labs  Lab 06/29/20 0817 07/01/20 0719 07/04/20 1748  WBC 10.7* 9.5 7.7  HGB 10.4* 10.2* 10.4*  HCT 32.8* 33.7* 34.3*  MCV 90.1 91.1 91.7  PLT 85* 73* 65*    Basic Metabolic Panel: Recent Labs  Lab 06/29/20 0816 07/01/20 0719 07/04/20 1748  NA 137 138 138  K 4.2 4.4 4.1  CL 104 103 101  CO2 20* 21* 26  GLUCOSE 120* 77 151*  BUN 77* 87* 55*  CREATININE 5.66* 5.32* 3.70*  CALCIUM 7.7* 7.7* 7.8*  PHOS 5.8* 6.0* 4.1     Liver Function Tests: Recent Labs  Lab  06/29/20 0816 07/01/20 0719 07/04/20 1748  ALBUMIN 1.9* 2.0* 1.9*     Antibiotics: Anti-infectives (From admission, onward)   Start     Dose/Rate Route Frequency Ordered Stop   06/24/20 1319  Vancomycin (VANCOCIN) 750-5 MG/150ML-% IVPB       Note to Pharmacy: Judieth Keens  : cabinet override      06/24/20 1319 06/24/20 1323   06/22/20 1200  vancomycin (VANCOCIN) IVPB 750 mg/150 ml premix        750 mg 150 mL/hr over 60 Minutes Intravenous Every T-Th-Sa (Hemodialysis) 06/21/20 1407 06/27/20 0958   06/21/20 1200  vancomycin (VANCOCIN) IVPB 750 mg/150 ml premix  Status:  Discontinued        750 mg 150 mL/hr over 60 Minutes Intravenous Every M-W-F (Hemodialysis) 06/20/20 0904 06/21/20 1407   06/20/20 1200  vancomycin (VANCOCIN) IVPB 750 mg/150 ml premix  Status:  Discontinued        750 mg 150 mL/hr over 60 Minutes Intravenous Every T-Th-Sa (Hemodialysis) 06/18/20 0819 06/19/20 0921   06/19/20 1200  vancomycin (VANCOCIN) IVPB 750 mg/150 ml premix        750 mg 150 mL/hr over 60 Minutes Intravenous Once in dialysis 06/19/20 0921 06/19/20 1657   06/17/20 1200  vancomycin (VANCOCIN) IVPB 500 mg/100 ml premix        500 mg 100 mL/hr over 60 Minutes Intravenous  Once 06/17/20 0811 06/17/20 1333   06/16/20 1200  vancomycin (VANCOCIN) IVPB 750 mg/150 ml premix        750 mg 150 mL/hr over 60 Minutes Intravenous  Once 06/15/20 1838 06/16/20 1626   06/13/20 1200  vancomycin (VANCOREADY) IVPB 750 mg/150 mL  Status:  Discontinued        750 mg 150 mL/hr over 60 Minutes Intravenous Every T-Th-Sa (Hemodialysis) 06/12/20 1202 06/17/20 0811   06/12/20 1245  vancomycin (VANCOREADY) IVPB 1750 mg/350 mL        1,750 mg 175 mL/hr over 120 Minutes Intravenous  Once 06/12/20 1158 06/12/20 1552   06/12/20 1000  remdesivir 100 mg in sodium chloride 0.9 % 100 mL IVPB       "Followed by" Linked Group Details   100 mg 200 mL/hr over 30 Minutes Intravenous Daily 06/11/20 1719 06/15/20 0902    06/11/20 2200  remdesivir 200 mg in sodium chloride 0.9% 250 mL IVPB       "Followed by" Linked Group Details   200 mg 580 mL/hr over 30 Minutes Intravenous Once 06/11/20 1719 06/12/20 0430       DVT prophylaxis: Eliquis  Code Status: Full code  Family Communication: No family at bedside   Consultants:  Nephrology  Procedures:      Objective   Vitals:   07/04/20 2045 07/05/20 0432 07/05/20 0832 07/05/20 0910  BP: (!) 161/64 (!) 151/63 (!) 154/60 (!) 152/67  Pulse: (!) 47 (!) 52 (!) 52 (!) 57  Resp: 16 16  18  Temp: 98.1 F (36.7 C) 98.7 F (37.1 C)  98.1 F (36.7 C)  TempSrc: Oral Oral    SpO2: 98% 95%  93%  Weight:      Height:        Intake/Output Summary (Last 24 hours) at 07/05/2020 1515 Last data filed at 07/05/2020 1245 Gross per 24 hour  Intake 1323 ml  Output 0 ml  Net 1323 ml    02/14 1901 - 02/16 0700 In: 8768 [P.O.:1560; I.V.:3] Out: 0   Filed Weights   07/02/20 0500 07/04/20 0835 07/04/20 1110  Weight: 61.4 kg 66 kg 63.5 kg    Physical Examination:    General: Appears in no acute distress  Cardiovascular: S1-S2, regular,  Respiratory: Clear to auscultation bilaterally,  left lower anterior chest wall tenderness to palpation  Abdomen: Abdomen is soft, nontender, no organomegaly  Extremities: No edema in the lower extremities  Neurologic: Alert, oriented x3, intact insight and judgment, mood and affect appropriate.   Status is: Inpatient  Dispo: The patient is from: Home              Anticipated d/c is to: Skilled nursing facility              Anticipated d/c date is: 07/07/2020              Patient currently stable for discharge  Barrier to discharge-awaiting bed at skilled nursing facility  Pressure Injury 01/18/20 Sacrum Stage 2 -  Partial thickness loss of dermis presenting as a shallow open injury with a red, pink wound bed without slough. (Active)  01/18/20 2251  Location: Sacrum  Location Orientation:   Staging:  Stage 2 -  Partial thickness loss of dermis presenting as a shallow open injury with a red, pink wound bed without slough.  Wound Description (Comments):   Present on Admission: Yes           Data Reviewed:   No results found for this or any previous visit (from the past 240 hour(s)).  No results for input(s): LIPASE, AMYLASE in the last 168 hours. No results for input(s): AMMONIA in the last 168 hours.  Cardiac Enzymes: No results for input(s): CKTOTAL, CKMB, CKMBINDEX, TROPONINI in the last 168 hours. BNP (last 3 results) Recent Labs    10/12/19 0011 06/11/20 1326  BNP 1,980.9* >4,500.0*    ProBNP (last 3 results) No results for input(s): PROBNP in the last 8760 hours.  Studies:  No results found.     Oswald Hillock   [facility] If 7PM-7AM, please contact night-coverage at [email or web address], Office  [phone]   07/05/2020, 3:15 PM  LOS: 24 days

## 2020-07-06 DIAGNOSIS — U071 COVID-19: Secondary | ICD-10-CM | POA: Diagnosis not present

## 2020-07-06 DIAGNOSIS — R7881 Bacteremia: Secondary | ICD-10-CM | POA: Diagnosis not present

## 2020-07-06 DIAGNOSIS — E1159 Type 2 diabetes mellitus with other circulatory complications: Secondary | ICD-10-CM | POA: Diagnosis not present

## 2020-07-06 DIAGNOSIS — B952 Enterococcus as the cause of diseases classified elsewhere: Secondary | ICD-10-CM | POA: Diagnosis not present

## 2020-07-06 LAB — RENAL FUNCTION PANEL
Albumin: 1.9 g/dL — ABNORMAL LOW (ref 3.5–5.0)
Anion gap: 9 (ref 5–15)
BUN: 39 mg/dL — ABNORMAL HIGH (ref 8–23)
CO2: 27 mmol/L (ref 22–32)
Calcium: 8 mg/dL — ABNORMAL LOW (ref 8.9–10.3)
Chloride: 99 mmol/L (ref 98–111)
Creatinine, Ser: 3.03 mg/dL — ABNORMAL HIGH (ref 0.61–1.24)
GFR, Estimated: 22 mL/min — ABNORMAL LOW (ref >60)
Glucose, Bld: 90 mg/dL (ref 70–99)
Phosphorus: 3.3 mg/dL (ref 2.5–4.6)
Potassium: 3.6 mmol/L (ref 3.5–5.1)
Sodium: 135 mmol/L (ref 135–145)

## 2020-07-06 LAB — GLUCOSE, CAPILLARY
Glucose-Capillary: 66 mg/dL — ABNORMAL LOW (ref 70–99)
Glucose-Capillary: 84 mg/dL (ref 70–99)
Glucose-Capillary: 107 mg/dL — ABNORMAL HIGH (ref 70–99)
Glucose-Capillary: 82 mg/dL (ref 70–99)
Glucose-Capillary: 164 mg/dL — ABNORMAL HIGH (ref 70–99)

## 2020-07-06 LAB — CBC
HCT: 33.8 % — ABNORMAL LOW (ref 39.0–52.0)
Hemoglobin: 10.4 g/dL — ABNORMAL LOW (ref 13.0–17.0)
MCH: 27.7 pg (ref 26.0–34.0)
MCHC: 30.8 g/dL (ref 30.0–36.0)
MCV: 90.1 fL (ref 80.0–100.0)
Platelets: 103 10*3/uL — ABNORMAL LOW (ref 150–400)
RBC: 3.75 MIL/uL — ABNORMAL LOW (ref 4.22–5.81)
RDW: 18.9 % — ABNORMAL HIGH (ref 11.5–15.5)
WBC: 5.9 10*3/uL (ref 4.0–10.5)
nRBC: 0 % (ref 0.0–0.2)

## 2020-07-06 MED ORDER — INSULIN GLARGINE 100 UNIT/ML ~~LOC~~ SOLN
5.0000 [IU] | Freq: Every day | SUBCUTANEOUS | Status: DC
  Administered 2020-07-07 – 2020-07-11 (×5): 5 [IU] via SUBCUTANEOUS
  Filled 2020-07-06 (×6): qty 0.05

## 2020-07-06 MED ORDER — DOXERCALCIFEROL 4 MCG/2ML IV SOLN
INTRAVENOUS | Status: AC
  Filled 2020-07-06: qty 2

## 2020-07-06 NOTE — Plan of Care (Signed)
  Problem: Health Behavior/Discharge Planning: Goal: Ability to manage health-related needs will improve Outcome: Progressing   Problem: Education: Goal: Understanding of medication regimen will improve Outcome: Progressing

## 2020-07-06 NOTE — Progress Notes (Signed)
Patient CBG-66, RN gave patient 120 mL of grape juice. Will inform on-coming nurse to have CBG rechecked in 30 minutes.

## 2020-07-06 NOTE — Progress Notes (Signed)
[facility] Progress Note   Subjective:     Paul [name] was seen and examined at bedside today. He still reports a mild chest pressure; however, says the chest pain is improving with movement. Otherwise, no concerns/compliants at this time. Denies shortness of breath, cough, N/V/D.  Objective Vitals:   07/05/20 1708 07/05/20 2051 07/06/20 0430 07/06/20 0500  BP: (!) 147/61 (!) 161/68 (!) 154/74   Pulse: (!) 59 (!) 50 60   Resp: 17 17 17    Temp: 98.1 F (36.7 C) 98.4 F (36.9 C) 98.4 F (36.9 C)   TempSrc:      SpO2: 98% 93% 95%   Weight:  63.5 kg  63.5 kg  Height:       Physical Exam General: Appears comfortable, in no acute distress Heart: Murmur noted; S1 and S2. Lungs: On RA; Lungs clear throughout w/o wheezing, rhonchi, and rales Abdomen: Soft, round, non-tender, non-distended, active bowel sounds Extremities: No edema bilateral lower extremities Dialysis Access: R AVF (+) Bruit/Thrill  Filed Weights   07/04/20 1110 07/05/20 2051 07/06/20 0500  Weight: 63.5 kg 63.5 kg 63.5 kg    Intake/Output Summary (Last 24 hours) at 07/06/2020 0921 Last data filed at 07/06/2020 0601 Gross per 24 hour  Intake 720 ml  Output 0 ml  Net 720 ml    Additional Objective Labs: Basic Metabolic Panel: Recent Labs  Lab 07/01/20 0719 07/04/20 1748  NA 138 138  K 4.4 4.1  CL 103 101  CO2 21* 26  GLUCOSE 77 151*  BUN 87* 55*  CREATININE 5.32* 3.70*  CALCIUM 7.7* 7.8*  PHOS 6.0* 4.1   Liver Function Tests: Recent Labs  Lab 07/01/20 0719 07/04/20 1748  ALBUMIN 2.0* 1.9*   No results for input(s): LIPASE, AMYLASE in the last 168 hours. CBC: Recent Labs  Lab 07/01/20 0719 07/04/20 1748  WBC 9.5 7.7  HGB 10.2* 10.4*  HCT 33.7* 34.3*  MCV 91.1 91.7  PLT 73* 65*   Blood Culture    Component Value Date/Time   SDES BLOOD LEFT HAND 06/13/2020 0803   SDES BLOOD LEFT HAND 06/13/2020 0803   SPECREQUEST  06/13/2020 0803    BOTTLES DRAWN AEROBIC AND  ANAEROBIC Blood Culture adequate volume   SPECREQUEST  06/13/2020 0803    BOTTLES DRAWN AEROBIC AND ANAEROBIC Blood Culture adequate volume   CULT  06/13/2020 0803    NO GROWTH 5 DAYS Performed at [facility], [place] [address] [place], [place] [address]    CULT  06/13/2020 0803    NO GROWTH 5 DAYS Performed at [facility] [address], [place], [place] [address]    REPTSTATUS 06/18/2020 FINAL 06/13/2020 0803   REPTSTATUS 06/18/2020 FINAL 06/13/2020 0803    Cardiac Enzymes: No results for input(s): CKTOTAL, CKMB, CKMBINDEX, TROPONINI in the last 168 hours. CBG: Recent Labs  Lab 07/05/20 1136 07/05/20 1618 07/05/20 2051 07/06/20 0635 07/06/20 0719  GLUCAP 107* 153* 122* 66* 84   Iron Studies: No results for input(s): IRON, TIBC, TRANSFERRIN, FERRITIN in the last 72 hours. Lab Results  Component Value Date   INR 1.2 01/04/2020   INR 1.2 10/28/2018   INR 1.25 06/14/2013    Medications: . sodium chloride     . apixaban  5 mg Oral BID  . aspirin  81 mg Oral Daily  . atorvastatin  10 mg Oral Q M,W,F  . Chlorhexidine Gluconate Cloth  6 each Topical Q0600  . doxercalciferol  2 mcg Intravenous Q  T,Th,Sa-HD  . feeding supplement (NEPRO CARB STEADY)  237 mL Oral TID WC  . hydrALAZINE  10 mg Oral TID  . insulin aspart  0-15 Units Subcutaneous TID WC  . insulin aspart  0-5 Units Subcutaneous QHS  . insulin glargine  7 Units Subcutaneous Daily  . isosorbide mononitrate  15 mg Oral Daily  . lipase/protease/amylase  12,000 Units Oral TID AC  . magnesium chloride  2 tablet Oral BID  . metoprolol succinate  25 mg Oral Daily  . pantoprazole  80 mg Oral Daily  . predniSONE  5 mg Oral Q breakfast  . sertraline  50 mg Oral QHS  . sevelamer carbonate  800 mg Oral TID WC  . sodium bicarbonate  1,950 mg Oral QID  . sodium chloride flush  3 mL Intravenous Q12H  . tacrolimus  2 mg Oral BID  . tamsulosin  0.4 mg Oral QPC supper    Dialysis Orders: HD: TTS Duration:  3hrs 2K/2.25Ca/BFR 400/DFR 600 UF goal: 2-2.5L  Assessment/Plan:  1. COVID-19/PNA: Tested (+) 06/11/20 currently out of window. Continue working with PT/OT with anticipated d/c to inpatient rehab. 2. ESRD: On HD TTS, will receive HD today. See above for HD orders. 3. Anemia of CKD: Hgb 10.4; Continue Darbepoetin IV weekly 4. Secondary hyperparathyroidism - Corrected Ca 9.48, PO4 4.1 Continue ADRA and binders. 5. HTN/volume - No signs for volume overload at this time. Scheduled for HD today. Continue Hydralazine, Metoprolol, and Imdur. 6. Nutrition - Continue renal diet; Albumin 1.9, continue Nepro supplement  Tobie Poet, NP [facility] 07/06/2020,9:21 AM  LOS: 25 days

## 2020-07-06 NOTE — Progress Notes (Addendum)
[facility]  PROGRESS NOTE  QUINTUS PREMO DPO:[id number] DOB: 1955/11/10 DOA: 06/11/2020 PCP: [name], MD   Brief HPI:   65 year old male with past medical history of atrial fibrillation on Eliquis, ESRD with failed renal transplant, on dialysis, TTS, diabetes mellitus type 2, chronic diastolic heart failure came to ED with complaints of dyspnea in setting of missed dialysis.  Was found to have Covid positive, also Enterococcus bacteremia.  2D echo was done which showed severely reduced EF.  CIR evaluation done, patient has been accepted for CIR but no beds available.    Subjective   Patient seen and examined, denies any complaints.   Assessment/Plan:     1. Acute respiratory failure with hypoxemia-secondary to fluid overload, resolved with hemodialysis. 2. Sepsis due to bacteremia/Enterococcus-patient had Enterococcus bacteremia likely urinary source.  2D echo showed no evidence of vegetation.  Dr. Olevia Bowens discussed with ID who recommended 2 weeks of IV antibiotics.  Antibiotics course has been completed in the hospital.   3. Acute metabolic encephalopathy-resolved likely from sepsis as above. 4. COVID-19 pneumonia-he was treated with remdesivir for 5 days, steroids weaned off.  Patient has completed isolation.  Symptoms have resolved. 5. ESRD on hemodialysis, failed renal transplant.  Patient on hemodialysis Tuesday Thursday Saturday.  Continue sevelamer, sodium bicarbonate, tacrolimus.  Nephrology following. 6. Diabetes mellitus type 2-CBG less than 100s, will decrease Lantus to 5 units subcu daily.  Continue sliding scale insulin with NovoLog.   7. Dysphagia-speech therapy was consulted, patient is on regular consistency diet. 8. Depression-continue Zoloft 9. Hyperlipidemia-continue Lipitor 10. Hypertension-blood pressure controlled on metoprolol, Imdur.     COVID-19 Labs  No results for input(s): DDIMER, FERRITIN, LDH, CRP in the last 72 hours.  Lab Results   Component Value Date   SARSCOV2NAA POSITIVE (A) 06/11/2020   [place] NEGATIVE 12/31/2019   [place] NEGATIVE 10/12/2019   [place] NEGATIVE 04/23/2019     Scheduled medications:   . apixaban  5 mg Oral BID  . aspirin  81 mg Oral Daily  . atorvastatin  10 mg Oral Q M,W,F  . Chlorhexidine Gluconate Cloth  6 each Topical Q0600  . doxercalciferol      . doxercalciferol  2 mcg Intravenous Q T,Th,Sa-HD  . feeding supplement (NEPRO CARB STEADY)  237 mL Oral TID WC  . hydrALAZINE  10 mg Oral TID  . insulin aspart  0-15 Units Subcutaneous TID WC  . insulin aspart  0-5 Units Subcutaneous QHS  . insulin glargine  7 Units Subcutaneous Daily  . isosorbide mononitrate  15 mg Oral Daily  . lipase/protease/amylase  12,000 Units Oral TID AC  . magnesium chloride  2 tablet Oral BID  . metoprolol succinate  25 mg Oral Daily  . pantoprazole  80 mg Oral Daily  . predniSONE  5 mg Oral Q breakfast  . sertraline  50 mg Oral QHS  . sevelamer carbonate  800 mg Oral TID WC  . sodium bicarbonate  1,950 mg Oral QID  . sodium chloride flush  3 mL Intravenous Q12H  . tacrolimus  2 mg Oral BID  . tamsulosin  0.4 mg Oral QPC supper         CBG: Recent Labs  Lab 07/05/20 1618 07/05/20 2051 07/06/20 0635 07/06/20 0719 07/06/20 1139  GLUCAP 153* 122* 66* 84 107*    SpO2: 96 % O2 Flow Rate (L/min): 1 L/min    CBC: Recent Labs  Lab 07/01/20 0719 07/04/20 1748  WBC 9.5 7.7  HGB 10.2* 10.4*  HCT 33.7* 34.3*  MCV 91.1 91.7  PLT 73* 65*    Basic Metabolic Panel: Recent Labs  Lab 07/01/20 0719 07/04/20 1748  NA 138 138  K 4.4 4.1  CL 103 101  CO2 21* 26  GLUCOSE 77 151*  BUN 87* 55*  CREATININE 5.32* 3.70*  CALCIUM 7.7* 7.8*  PHOS 6.0* 4.1     Liver Function Tests: Recent Labs  Lab 07/01/20 0719 07/04/20 1748  ALBUMIN 2.0* 1.9*     Antibiotics: Anti-infectives (From admission, onward)   Start     Dose/Rate Route Frequency Ordered Stop   06/24/20 1319   Vancomycin (VANCOCIN) 750-5 MG/150ML-% IVPB       Note to Pharmacy: Judieth Keens  : cabinet override      06/24/20 1319 06/24/20 1323   06/22/20 1200  vancomycin (VANCOCIN) IVPB 750 mg/150 ml premix        750 mg 150 mL/hr over 60 Minutes Intravenous Every T-Th-Sa (Hemodialysis) 06/21/20 1407 06/27/20 0958   06/21/20 1200  vancomycin (VANCOCIN) IVPB 750 mg/150 ml premix  Status:  Discontinued        750 mg 150 mL/hr over 60 Minutes Intravenous Every M-W-F (Hemodialysis) 06/20/20 0904 06/21/20 1407   06/20/20 1200  vancomycin (VANCOCIN) IVPB 750 mg/150 ml premix  Status:  Discontinued        750 mg 150 mL/hr over 60 Minutes Intravenous Every T-Th-Sa (Hemodialysis) 06/18/20 0819 06/19/20 0921   06/19/20 1200  vancomycin (VANCOCIN) IVPB 750 mg/150 ml premix        750 mg 150 mL/hr over 60 Minutes Intravenous Once in dialysis 06/19/20 0921 06/19/20 1657   06/17/20 1200  vancomycin (VANCOCIN) IVPB 500 mg/100 ml premix        500 mg 100 mL/hr over 60 Minutes Intravenous  Once 06/17/20 0811 06/17/20 1333   06/16/20 1200  vancomycin (VANCOCIN) IVPB 750 mg/150 ml premix        750 mg 150 mL/hr over 60 Minutes Intravenous  Once 06/15/20 1838 06/16/20 1626   06/13/20 1200  vancomycin (VANCOREADY) IVPB 750 mg/150 mL  Status:  Discontinued        750 mg 150 mL/hr over 60 Minutes Intravenous Every T-Th-Sa (Hemodialysis) 06/12/20 1202 06/17/20 0811   06/12/20 1245  vancomycin (VANCOREADY) IVPB 1750 mg/350 mL        1,750 mg 175 mL/hr over 120 Minutes Intravenous  Once 06/12/20 1158 06/12/20 1552   06/12/20 1000  remdesivir 100 mg in sodium chloride 0.9 % 100 mL IVPB       "Followed by" Linked Group Details   100 mg 200 mL/hr over 30 Minutes Intravenous Daily 06/11/20 1719 06/15/20 0902   06/11/20 2200  remdesivir 200 mg in sodium chloride 0.9% 250 mL IVPB       "Followed by" Linked Group Details   200 mg 580 mL/hr over 30 Minutes Intravenous Once 06/11/20 1719 06/12/20 0430       DVT  prophylaxis: Eliquis  Code Status: Full code  Family Communication: No family at bedside   Consultants:  Nephrology  Procedures:      Objective   Vitals:   07/06/20 1400 07/06/20 1430 07/06/20 1500 07/06/20 1530  BP: 110/62 (!) 103/59 113/64 111/68  Pulse:      Resp:   (!) 22   Temp:      TempSrc:      SpO2:      Weight:      Height:        Intake/Output  Summary (Last 24 hours) at 07/06/2020 1605 Last data filed at 07/06/2020 1000 Gross per 24 hour  Intake 840 ml  Output 0 ml  Net 840 ml    02/15 1901 - 02/17 0700 In: 2947 [P.O.:1320; I.V.:3] Out: 0   Filed Weights   07/05/20 2051 07/06/20 0500 07/06/20 1216  Weight: 63.5 kg 63.5 kg 68.4 kg    Physical Examination:    General-appears in no acute distress  Heart-S1-S2, regular, no murmur auscultated  Lungs-clear to auscultation bilaterally, no wheezing or crackles auscultated  Abdomen-soft, nontender, no organomegaly  Extremities-bilateral BKA  Neuro-alert, oriented x3, no focal deficit noted   Status is: Inpatient  Dispo: The patient is from: Home              Anticipated d/c is to: Skilled nursing facility              Anticipated d/c date is: 07/07/2020              Patient currently stable for discharge  Barrier to discharge-awaiting bed at skilled nursing facility  Pressure Injury 01/18/20 Sacrum Stage 2 -  Partial thickness loss of dermis presenting as a shallow open injury with a red, pink wound bed without slough. (Active)  01/18/20 2251  Location: Sacrum  Location Orientation:   Staging: Stage 2 -  Partial thickness loss of dermis presenting as a shallow open injury with a red, pink wound bed without slough.  Wound Description (Comments):   Present on Admission: Yes           Data Reviewed:   No results found for this or any previous visit (from the past 240 hour(s)).  No results for input(s): LIPASE, AMYLASE in the last 168 hours. No results for input(s): AMMONIA in  the last 168 hours.  Cardiac Enzymes: No results for input(s): CKTOTAL, CKMB, CKMBINDEX, TROPONINI in the last 168 hours. BNP (last 3 results) Recent Labs    10/12/19 0011 06/11/20 1326  BNP 1,980.9* >4,500.0*    ProBNP (last 3 results) No results for input(s): PROBNP in the last 8760 hours.  Studies:  No results found.     Oswald Hillock   [facility] If 7PM-7AM, please contact night-coverage at [email or web address], Office  [phone]   07/06/2020, 4:05 PM  LOS: 25 days

## 2020-07-06 NOTE — Progress Notes (Signed)
Inpatient Diabetes Program Recommendations  AACE/ADA: New Consensus Statement on Inpatient Glycemic Control  Target Ranges:  Prepandial:   less than 140 mg/dL      Peak postprandial:   less than 180 mg/dL (1-2 hours)      Critically ill patients:  140 - 180 mg/dL   Results for KAELON, WEEKES (MRN [id number]) as of 07/06/2020 09:43  Ref. Range 07/05/2020 06:38 07/05/2020 11:36 07/05/2020 16:18 07/05/2020 20:51 07/06/2020 06:35 07/06/2020 07:19  Glucose-Capillary Latest Ref Range: 70 - 99 mg/dL 96 107 (H) 153 (H) 122 (H) 66 (L) 84   Review of Glycemic Control  Current orders for Inpatient glycemic control: Lantus 7 units daily, Novolog 0-15 units TID with meals, Novolog 0-5 units QHS; Prednisone 5 mg QAM  Inpatient Diabetes Program Recommendations:    Insulin: Please consider decreasing Lantus to 5 units daily and Novolog correction to 0-9 units TID with meals.  Thanks, Barnie Alderman, RN, MSN, CDE Diabetes Coordinator Inpatient Diabetes Program [phone] (Team Pager from 8am to 5pm)

## 2020-07-07 DIAGNOSIS — B952 Enterococcus as the cause of diseases classified elsewhere: Secondary | ICD-10-CM | POA: Diagnosis not present

## 2020-07-07 DIAGNOSIS — R7881 Bacteremia: Secondary | ICD-10-CM | POA: Diagnosis not present

## 2020-07-07 LAB — GLUCOSE, CAPILLARY
Glucose-Capillary: 121 mg/dL — ABNORMAL HIGH (ref 70–99)
Glucose-Capillary: 134 mg/dL — ABNORMAL HIGH (ref 70–99)
Glucose-Capillary: 135 mg/dL — ABNORMAL HIGH (ref 70–99)
Glucose-Capillary: 168 mg/dL — ABNORMAL HIGH (ref 70–99)

## 2020-07-07 NOTE — Progress Notes (Signed)
[facility] Progress Note   Subjective:     [name] was seen and examined at bedside today. He had no issues or concerns. Reports feeling well and chest pain is improving. Also reports sleeping has improved as well. Denies SOB, ABD pain, N/V/D.  Denies shortness of breath, cough, N/V/D.  Objective Vitals:   07/06/20 2055 07/07/20 0459 07/07/20 0515 07/07/20 0946  BP: 130/66  132/61 127/62  Pulse: 64  60 61  Resp: 18  18 18   Temp: 98.9 F (37.2 C)  98.5 F (36.9 C) 98.3 F (36.8 C)  TempSrc:   Oral Oral  SpO2: 95%  99% 98%  Weight:  66.3 kg    Height:       Physical Exam General:Appears comfortable, in no acute distress Heart:Murmur noted; S1 and S2. Lungs:On RA; Lungs clear throughout w/o wheezing, rhonchi, and rales Abdomen:Soft, round, non-tender, non-distended, active bowel sounds Extremities:No edema bilateral lower extremities Dialysis Access:R AVF (+) Bruit/Thrill   Filed Weights   07/06/20 1216 07/06/20 1536 07/07/20 0459  Weight: 68.4 kg 66.3 kg 66.3 kg    Intake/Output Summary (Last 24 hours) at 07/07/2020 1106 Last data filed at 07/07/2020 0700 Gross per 24 hour  Intake 603 ml  Output 2500 ml  Net -1897 ml    Additional Objective Labs: Basic Metabolic Panel: Recent Labs  Lab 07/01/20 0719 07/04/20 1748 07/06/20 1637  NA 138 138 135  K 4.4 4.1 3.6  CL 103 101 99  CO2 21* 26 27  GLUCOSE 77 151* 90  BUN 87* 55* 39*  CREATININE 5.32* 3.70* 3.03*  CALCIUM 7.7* 7.8* 8.0*  PHOS 6.0* 4.1 3.3   Liver Function Tests: Recent Labs  Lab 07/01/20 0719 07/04/20 1748 07/06/20 1637  ALBUMIN 2.0* 1.9* 1.9*   No results for input(s): LIPASE, AMYLASE in the last 168 hours. CBC: Recent Labs  Lab 07/01/20 0719 07/04/20 1748 07/06/20 1637  WBC 9.5 7.7 5.9  HGB 10.2* 10.4* 10.4*  HCT 33.7* 34.3* 33.8*  MCV 91.1 91.7 90.1  PLT 73* 65* 103*   Blood Culture    Component Value Date/Time   SDES BLOOD LEFT HAND 06/13/2020 0803    SDES BLOOD LEFT HAND 06/13/2020 0803   SPECREQUEST  06/13/2020 0803    BOTTLES DRAWN AEROBIC AND ANAEROBIC Blood Culture adequate volume   SPECREQUEST  06/13/2020 0803    BOTTLES DRAWN AEROBIC AND ANAEROBIC Blood Culture adequate volume   CULT  06/13/2020 0803    NO GROWTH 5 DAYS Performed at [facility] [address] [place], [place] [address]    CULT  06/13/2020 0803    NO GROWTH 5 DAYS Performed at [facility] [address] [place], [place] [address]    REPTSTATUS 06/18/2020 FINAL 06/13/2020 0803   REPTSTATUS 06/18/2020 FINAL 06/13/2020 0803    CBG: Recent Labs  Lab 07/06/20 0719 07/06/20 1139 07/06/20 1641 07/06/20 2055 07/07/20 0636  GLUCAP 84 107* 82 164* 121*   Iron Studies: No results for input(s): IRON, TIBC, TRANSFERRIN, FERRITIN in the last 72 hours. Lab Results  Component Value Date   INR 1.2 01/04/2020   INR 1.2 10/28/2018   INR 1.25 06/14/2013    Medications: . sodium chloride     . apixaban  5 mg Oral BID  . aspirin  81 mg Oral Daily  . atorvastatin  10 mg Oral Q M,W,F  . Chlorhexidine Gluconate Cloth  6 each Topical Q0600  . doxercalciferol  2 mcg Intravenous Q  T,Th,Sa-HD  . feeding supplement (NEPRO CARB STEADY)  237 mL Oral TID WC  . hydrALAZINE  10 mg Oral TID  . insulin aspart  0-15 Units Subcutaneous TID WC  . insulin aspart  0-5 Units Subcutaneous QHS  . insulin glargine  5 Units Subcutaneous Daily  . isosorbide mononitrate  15 mg Oral Daily  . lipase/protease/amylase  12,000 Units Oral TID AC  . magnesium chloride  2 tablet Oral BID  . metoprolol succinate  25 mg Oral Daily  . pantoprazole  80 mg Oral Daily  . predniSONE  5 mg Oral Q breakfast  . sertraline  50 mg Oral QHS  . sevelamer carbonate  800 mg Oral TID WC  . sodium bicarbonate  1,950 mg Oral QID  . sodium chloride flush  3 mL Intravenous Q12H  . tacrolimus  2 mg Oral BID  . tamsulosin  0.4 mg Oral QPC supper   Dialysis Orders: HD: TTS Duration:  3hrs 2K/2.25Ca/BFR 400/DFR 600 UF goal: 2-2.5L  Assessment/Plan: 1.COVID-19/PNA: Tested (+) 06/11/20 currently out of window. Continue working with PT/OT with anticipated d/c to inpatient rehab. 2. ESRD: On HD TTS, will receive HD tomorrow (07/08/20). See above for HD orders. 3. Anemiaof CKD: Continue Darbepoetin IV weekly 4. Secondary hyperparathyroidism- Continue ADRA and binders. 5.HTN/volume- No signs for volume overload at this time. Scheduled for HD tomorrow 07/08/20. BP trend improving. Continue Hydralazine, Metoprolol, and Imdur. 6. Nutrition- Continue renal diet; Albumin 1.9, continue Nepro supplement   Tobie Poet, NP [facility] 07/07/2020,11:06 AM  LOS: 26 days

## 2020-07-07 NOTE — Progress Notes (Signed)
Occupational Therapy Treatment Patient Details Name: Paul [name] MRN: [id number] DOB: November 15, 1955 Today's Date: 07/07/2020    History of present illness 65 yo male presenting to ED via EMS after missing dialysis and weakness with COVID-19 +. Off COVID precautions on 2/3. PMH including HTN, DM2, renal transplant, CKD st IV, calciphylaxis, A-Fib, bil. TTAs, bilateral BKA with prosthesis,and TIA.   OT comments  Pt making steady progress towards OT goals this session. Session focus on functional mobility as precursor to higher level BADLs. Pt able to mobilize OOB to recliner this session however pt continues to present with impaired function endurance and generalized weakness needing MAX A +2 and x2 attempts to power into full stand. MOD A +1 for stand pivot to recliner with RW. Pt completed seated ADLs in front of sink with set- up assist, MIN guard for LB ADLs from EOB however pt needed up to MOD A to pull pants up to waist line in standing. Pt would continue to benefit from skilled occupational therapy while admitted and after d/c to address the below listed limitations in order to improve overall functional mobility and facilitate independence with BADL participation. DC plan remains appropriate, will follow acutely per POC.     Follow Up Recommendations  CIR;Supervision/Assistance - 24 hour    Equipment Recommendations  None recommended by OT;Other (comment) (well equipped)    Recommendations for Other Services      Precautions / Restrictions Precautions Precautions: Fall;Other (comment) Precaution Comments: B BKA with prosthetics in room now, mod fall Restrictions Weight Bearing Restrictions: No       Mobility Bed Mobility Overal bed mobility: Needs Assistance Bed Mobility: Rolling;Sidelying to Sit Rolling: Modified independent (Device/Increase time) Sidelying to sit: Mod assist;HOB elevated;+2 for physical assistance       General bed mobility comments: heavy MOD A +2 to  elevate trunk into sitting from sidelying  Transfers Overall transfer level: Needs assistance Equipment used: Rolling walker (2 wheeled) Transfers: Sit to/from Stand Sit to Stand: Max assist;+2 physical assistance Stand pivot transfers: Mod assist;+2 safety/equipment       General transfer comment: pt required x2 attempts and MAX A +2 to power into standing from EOB, use of momentum helpful. MOD A +2 mostly for safety to pivot to recliner as pt heavily leaning foward on RA    Balance Overall balance assessment: Needs assistance Sitting-balance support: Feet unsupported;Single extremity supported Sitting balance-Leahy Scale: Poor Sitting balance - Comments: Reliant on UE for sitting, minguard provided when completing LB dressing from EOB for safety   Standing balance support: Bilateral upper extremity supported Standing balance-Leahy Scale: Poor Standing balance comment: Reliant on B UE support                           ADL either performed or assessed with clinical judgement   ADL Overall ADL's : Needs assistance/impaired     Grooming: Wash/dry face;Oral care;Sitting Grooming Details (indicate cue type and reason): shaving at sink with supervision seated in recliner     Lower Body Bathing: Total assistance;Bed level Lower Body Bathing Details (indicate cue type and reason): total A for LB bathing from bed level Upper Body Dressing : Minimal assistance;Sitting Upper Body Dressing Details (indicate cue type and reason): Min A for donning clean gown Lower Body Dressing: Moderate assistance;Sit to/from stand;Supervision/safety;Min guard Lower Body Dressing Details (indicate cue type and reason): pt able to roll R<>L to thread and pull brief up to mid thigh, pt  donned shorts EOB with min guard for balance, MOD A to pull pants and brief fully up to waist line in standing Toilet Transfer: Maximal assistance;RW;Stand-pivot;Moderate assistance Toilet Transfer Details  (indicate cue type and reason): MAX A +2 to power into standing from EOB and x3 attempts, MOD A to pivot to recliner as pt heavily leaning forward on RW Toileting- Clothing Manipulation and Hygiene: Total assistance;Bed level Toileting - Clothing Manipulation Details (indicate cue type and reason): total A for pericare from bed level     Functional mobility during ADLs: Moderate assistance;Maximal assistance;+2 for physical assistance;+2 for safety/equipment;Rolling walker General ADL Comments: pt able to mobilize OOB this session, pt continues to present with generalized weakness and decreased activity tolerance     Vision       Perception     Praxis      Cognition Arousal/Alertness: Awake/alert Behavior During Therapy: Flat affect Overall Cognitive Status: No family/caregiver present to determine baseline cognitive functioning Area of Impairment: Problem solving;Safety/judgement                         Safety/Judgement: Decreased awareness of safety;Decreased awareness of deficits   Problem Solving: Slow processing;Requires verbal cues General Comments: pt slow to process and slow to respond but following commands, continues to be very flat but pleasant        Exercises     Shoulder Instructions       General Comments      Pertinent Vitals/ Pain       Pain Assessment: Faces Faces Pain Scale: Hurts a little bit Pain Location: ABD pain Pain Descriptors / Indicators: Sore Pain Intervention(s): Monitored during session  Home Living                                          Prior Functioning/Environment              Frequency  Min 2X/week        Progress Toward Goals  OT Goals(current goals can now be found in the care plan section)  Progress towards OT goals: Progressing toward goals  Acute Rehab OT Goals Patient Stated Goal: get to chair OT Goal Formulation: With patient Time For Goal Achievement: 07/12/20 Potential to  Achieve Goals: [place] Discharge plan remains appropriate;Frequency remains appropriate    Co-evaluation                 AM-PAC OT "6 Clicks" Daily Activity     Outcome Measure   Help from another person eating meals?: None Help from another person taking care of personal grooming?: A Little Help from another person toileting, which includes using toliet, bedpan, or urinal?: A Lot Help from another person bathing (including washing, rinsing, drying)?: A Lot Help from another person to put on and taking off regular upper body clothing?: A Little Help from another person to put on and taking off regular lower body clothing?: A Little 6 Click Score: 17    End of Session Equipment Utilized During Treatment: Gait belt;Rolling walker  OT Visit Diagnosis: Other abnormalities of gait and mobility (R26.89);Muscle weakness (generalized) (M62.81)   Activity Tolerance Patient tolerated treatment well   Patient Left in chair;with call bell/phone within reach;with chair alarm set   Nurse Communication Mobility status        Time: 1455-1534 OT Time Calculation (min): 39  min  Charges: OT General Charges $OT Visit: 1 Visit OT Treatments $Self Care/Home Management : 38-52 mins  Harley Alto., COTA/L Acute Rehabilitation Services [phone]    Precious Haws 07/07/2020, 4:27 PM

## 2020-07-07 NOTE — TOC Progression Note (Addendum)
Transition of Care [facility]) - Progression Note    Patient Details  Name: Paul [name] MRN: [id number] Date of Birth: [birth year]  [facility] CM/SW Contact  Sharlet Salina Mila Homer, LCSW Phone Number: 07/07/2020, 4:05 PM  Clinical Narrative:  Visited with patient and wife on 2/17 and talked again regarding his [employer]. CSW informed by patient that he plans to call [employer] Falling today regarding him still showing active with this insurance.   07/07/20: Visited with patient and he reported that he called [employer] and he is still showing active with COBRA. 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 Patient explained that he worked there 18 years and moved from [place] to [place] and continued to working for [employer]. [name] stated that the Human Resources (HR) department person told him that they don't know where the payments are coming from. [name] added that when he was in [place] in August/September of 2021, Deanna Artis was set-up at that time. He was advised to not respond to any correspondence from [facility] regarding the COBRA and it would be closed, however this does not appear to be the case per patient. Mr. Gum indicated that he is waiting to hear from HR.  4:34 pm: Revisited with patient and was informed that he has not heard from his former job's human resources department yet and plans to call them on Monday. [name] was informed that CSW will follow-up with him on Monday.    Expected Discharge Plan: Skilled Nursing Facility Barriers to Discharge: Continued Medical Work up  Expected Discharge Plan and Services Expected Discharge Plan: [place] In-house Referral: NA Discharge Planning Services: CM Consult Post Acute Care Choice: [place] Living arrangements for the past 2 months: Single Family Home                 DME Arranged: N/A DME Agency: NA       HH Arranged: NA HH Agency: NA          Social Determinants of Health (SDOH) Interventions    Readmission Risk Interventions Readmission Risk Prevention Plan 01/18/2020 04/26/2019 11/04/2018  Transportation Screening Complete Complete Complete  Medication Review Press photographer) Complete Complete Complete  PCP or Specialist appointment within 3-5 days of discharge Complete Not Complete Complete  PCP/Specialist Appt Not Complete comments - pending medical stability -  [place] or Home Care Consult Complete Complete Complete  SW Recovery Care/Counseling Consult Complete Complete Complete  Palliative Care Screening Not Applicable Not Complete Not Applicable  Comments - may be appropriate, will ask MD -  [place] Not Applicable Patient Refused Not Applicable  Some recent data might be hidden

## 2020-07-07 NOTE — Progress Notes (Signed)
[facility]  PROGRESS NOTE  Paul [name] TUU:[id number] DOB: July 17, 1955 DOA: 06/11/2020 PCP: [name], MD   Brief HPI:   65 year old male with past medical history of atrial fibrillation on Eliquis, ESRD with failed renal transplant, on dialysis, TTS, diabetes mellitus type 2, chronic diastolic heart failure came to ED with complaints of dyspnea in setting of missed dialysis.  Was found to have Covid positive, also Enterococcus bacteremia.  2D echo was done which showed severely reduced EF.  CIR evaluation done, patient has been accepted for CIR but no beds available.    Subjective   Patient seen and examined, no new complaints.  Awaiting bed at skilled nursing facility   Assessment/Plan:     1. Acute respiratory failure with hypoxemia-secondary to fluid overload, resolved with hemodialysis. 2. Sepsis due to bacteremia/Enterococcus-patient had Enterococcus bacteremia likely urinary source.  2D echo showed no evidence of vegetation.  Dr. Olevia Bowens discussed with ID who recommended 2 weeks of IV antibiotics.  Antibiotics course has been completed in the hospital.   3. Acute metabolic encephalopathy-resolved likely from sepsis as above. 4. COVID-19 pneumonia-he was treated with remdesivir for 5 days, steroids weaned off.  Patient has completed isolation.  Symptoms have resolved. 5. ESRD on hemodialysis, failed renal transplant.  Patient on hemodialysis Tuesday Thursday Saturday.  Continue sevelamer, sodium bicarbonate, tacrolimus.  Nephrology following. 6. Diabetes mellitus type 2-CBG less than 100s, will decrease Lantus to 5 units subcu daily.  Continue sliding scale insulin with NovoLog.   7. Dysphagia-speech therapy was consulted, patient is on regular consistency diet. 8. Depression-continue Zoloft 9. Hyperlipidemia-continue Lipitor 10. Hypertension-blood pressure controlled on metoprolol, Imdur.     COVID-19 Labs  No results for input(s): DDIMER, FERRITIN, LDH, CRP in the  last 72 hours.  Lab Results  Component Value Date   SARSCOV2NAA POSITIVE (A) 06/11/2020   [place] NEGATIVE 12/31/2019   [place] NEGATIVE 10/12/2019   [place] NEGATIVE 04/23/2019     Scheduled medications:   . apixaban  5 mg Oral BID  . aspirin  81 mg Oral Daily  . atorvastatin  10 mg Oral Q M,W,F  . Chlorhexidine Gluconate Cloth  6 each Topical Q0600  . doxercalciferol  2 mcg Intravenous Q T,Th,Sa-HD  . feeding supplement (NEPRO CARB STEADY)  237 mL Oral TID WC  . hydrALAZINE  10 mg Oral TID  . insulin aspart  0-15 Units Subcutaneous TID WC  . insulin aspart  0-5 Units Subcutaneous QHS  . insulin glargine  5 Units Subcutaneous Daily  . isosorbide mononitrate  15 mg Oral Daily  . lipase/protease/amylase  12,000 Units Oral TID AC  . magnesium chloride  2 tablet Oral BID  . metoprolol succinate  25 mg Oral Daily  . pantoprazole  80 mg Oral Daily  . predniSONE  5 mg Oral Q breakfast  . sertraline  50 mg Oral QHS  . sevelamer carbonate  800 mg Oral TID WC  . sodium bicarbonate  1,950 mg Oral QID  . sodium chloride flush  3 mL Intravenous Q12H  . tacrolimus  2 mg Oral BID  . tamsulosin  0.4 mg Oral QPC supper         CBG: Recent Labs  Lab 07/06/20 1139 07/06/20 1641 07/06/20 2055 07/07/20 0636 07/07/20 1232  GLUCAP 107* 82 164* 121* 134*    SpO2: 98 % O2 Flow Rate (L/min): 1 L/min    CBC: Recent Labs  Lab 07/01/20 0719 07/04/20 1748 07/06/20 1637  WBC 9.5 7.7 5.9  HGB 10.2* 10.4* 10.4*  HCT 33.7* 34.3* 33.8*  MCV 91.1 91.7 90.1  PLT 73* 65* 103*    Basic Metabolic Panel: Recent Labs  Lab 07/01/20 0719 07/04/20 1748 07/06/20 1637  NA 138 138 135  K 4.4 4.1 3.6  CL 103 101 99  CO2 21* 26 27  GLUCOSE 77 151* 90  BUN 87* 55* 39*  CREATININE 5.32* 3.70* 3.03*  CALCIUM 7.7* 7.8* 8.0*  PHOS 6.0* 4.1 3.3     Liver Function Tests: Recent Labs  Lab 07/01/20 0719 07/04/20 1748 07/06/20 1637  ALBUMIN 2.0* 1.9* 1.9*      Antibiotics: Anti-infectives (From admission, onward)   Start     Dose/Rate Route Frequency Ordered Stop   06/24/20 1319  Vancomycin (VANCOCIN) 750-5 MG/150ML-% IVPB       Note to Pharmacy: Judieth Keens  : cabinet override      06/24/20 1319 06/24/20 1323   06/22/20 1200  vancomycin (VANCOCIN) IVPB 750 mg/150 ml premix        750 mg 150 mL/hr over 60 Minutes Intravenous Every T-Th-Sa (Hemodialysis) 06/21/20 1407 06/27/20 0958   06/21/20 1200  vancomycin (VANCOCIN) IVPB 750 mg/150 ml premix  Status:  Discontinued        750 mg 150 mL/hr over 60 Minutes Intravenous Every M-W-F (Hemodialysis) 06/20/20 0904 06/21/20 1407   06/20/20 1200  vancomycin (VANCOCIN) IVPB 750 mg/150 ml premix  Status:  Discontinued        750 mg 150 mL/hr over 60 Minutes Intravenous Every T-Th-Sa (Hemodialysis) 06/18/20 0819 06/19/20 0921   06/19/20 1200  vancomycin (VANCOCIN) IVPB 750 mg/150 ml premix        750 mg 150 mL/hr over 60 Minutes Intravenous Once in dialysis 06/19/20 0921 06/19/20 1657   06/17/20 1200  vancomycin (VANCOCIN) IVPB 500 mg/100 ml premix        500 mg 100 mL/hr over 60 Minutes Intravenous  Once 06/17/20 0811 06/17/20 1333   06/16/20 1200  vancomycin (VANCOCIN) IVPB 750 mg/150 ml premix        750 mg 150 mL/hr over 60 Minutes Intravenous  Once 06/15/20 1838 06/16/20 1626   06/13/20 1200  vancomycin (VANCOREADY) IVPB 750 mg/150 mL  Status:  Discontinued        750 mg 150 mL/hr over 60 Minutes Intravenous Every T-Th-Sa (Hemodialysis) 06/12/20 1202 06/17/20 0811   06/12/20 1245  vancomycin (VANCOREADY) IVPB 1750 mg/350 mL        1,750 mg 175 mL/hr over 120 Minutes Intravenous  Once 06/12/20 1158 06/12/20 1552   06/12/20 1000  remdesivir 100 mg in sodium chloride 0.9 % 100 mL IVPB       "Followed by" Linked Group Details   100 mg 200 mL/hr over 30 Minutes Intravenous Daily 06/11/20 1719 06/15/20 0902   06/11/20 2200  remdesivir 200 mg in sodium chloride 0.9% 250 mL IVPB        "Followed by" Linked Group Details   200 mg 580 mL/hr over 30 Minutes Intravenous Once 06/11/20 1719 06/12/20 0430       DVT prophylaxis: Eliquis  Code Status: Full code  Family Communication: No family at bedside   Consultants:  Nephrology  Procedures:      Objective   Vitals:   07/06/20 2055 07/07/20 0459 07/07/20 0515 07/07/20 0946  BP: 130/66  132/61 127/62  Pulse: 64  60 61  Resp: 18  18 18   Temp: 98.9 F (37.2 C)  98.5 F (36.9 C) 98.3 F (36.8  C)  TempSrc:   Oral Oral  SpO2: 95%  99% 98%  Weight:  66.3 kg    Height:        Intake/Output Summary (Last 24 hours) at 07/07/2020 1410 Last data filed at 07/07/2020 0700 Gross per 24 hour  Intake 483 ml  Output 2500 ml  Net -2017 ml    02/16 1901 - 02/18 0700 In: 1083 [P.O.:1080; I.V.:3] Out: 2500   Filed Weights   07/06/20 1216 07/06/20 1536 07/07/20 0459  Weight: 68.4 kg 66.3 kg 66.3 kg    Physical Examination:    General-appears in no acute distress  Heart-S1-S2, regular, no murmur auscultated  Lungs-clear to auscultation bilaterally, no wheezing or crackles auscultated  Abdomen-soft, nontender, no organomegaly  Extremities-bilateral BKA  Neuro-alert, oriented x3, no focal deficit noted   Status is: Inpatient  Dispo: The patient is from: Home              Anticipated d/c is to: Skilled nursing facility              Anticipated d/c date is: 07/07/2020              Patient currently stable for discharge  Barrier to discharge-awaiting bed at skilled nursing facility  Pressure Injury 01/18/20 Sacrum Stage 2 -  Partial thickness loss of dermis presenting as a shallow open injury with a red, pink wound bed without slough. (Active)  01/18/20 2251  Location: Sacrum  Location Orientation:   Staging: Stage 2 -  Partial thickness loss of dermis presenting as a shallow open injury with a red, pink wound bed without slough.  Wound Description (Comments):   Present on Admission: Yes            Data Reviewed:   No results found for this or any previous visit (from the past 240 hour(s)).  No results for input(s): LIPASE, AMYLASE in the last 168 hours. No results for input(s): AMMONIA in the last 168 hours.  Cardiac Enzymes: No results for input(s): CKTOTAL, CKMB, CKMBINDEX, TROPONINI in the last 168 hours. BNP (last 3 results) Recent Labs    10/12/19 0011 06/11/20 1326  BNP 1,980.9* >4,500.0*    ProBNP (last 3 results) No results for input(s): PROBNP in the last 8760 hours.  Studies:  No results found.     Oswald Hillock   [facility] If 7PM-7AM, please contact night-coverage at [email or web address], Office  [phone]   07/07/2020, 2:10 PM  LOS: 26 days

## 2020-07-08 DIAGNOSIS — B952 Enterococcus as the cause of diseases classified elsewhere: Secondary | ICD-10-CM | POA: Diagnosis not present

## 2020-07-08 DIAGNOSIS — R7881 Bacteremia: Secondary | ICD-10-CM | POA: Diagnosis not present

## 2020-07-08 DIAGNOSIS — U071 COVID-19: Secondary | ICD-10-CM | POA: Diagnosis not present

## 2020-07-08 DIAGNOSIS — E1159 Type 2 diabetes mellitus with other circulatory complications: Secondary | ICD-10-CM | POA: Diagnosis not present

## 2020-07-08 LAB — CBC
HCT: 28.7 % — ABNORMAL LOW (ref 39.0–52.0)
Hemoglobin: 8.6 g/dL — ABNORMAL LOW (ref 13.0–17.0)
MCH: 27.7 pg (ref 26.0–34.0)
MCHC: 30 g/dL (ref 30.0–36.0)
MCV: 92.3 fL (ref 80.0–100.0)
Platelets: 144 10*3/uL — ABNORMAL LOW (ref 150–400)
RBC: 3.11 MIL/uL — ABNORMAL LOW (ref 4.22–5.81)
RDW: 18.6 % — ABNORMAL HIGH (ref 11.5–15.5)
WBC: 5.4 10*3/uL (ref 4.0–10.5)
nRBC: 0 % (ref 0.0–0.2)

## 2020-07-08 LAB — RENAL FUNCTION PANEL
Albumin: 1.8 g/dL — ABNORMAL LOW (ref 3.5–5.0)
Anion gap: 14 (ref 5–15)
BUN: 80 mg/dL — ABNORMAL HIGH (ref 8–23)
CO2: 25 mmol/L (ref 22–32)
Calcium: 7.9 mg/dL — ABNORMAL LOW (ref 8.9–10.3)
Chloride: 97 mmol/L — ABNORMAL LOW (ref 98–111)
Creatinine, Ser: 4.83 mg/dL — ABNORMAL HIGH (ref 0.61–1.24)
GFR, Estimated: 13 mL/min — ABNORMAL LOW (ref >60)
Glucose, Bld: 267 mg/dL — ABNORMAL HIGH (ref 70–99)
Phosphorus: 5.1 mg/dL — ABNORMAL HIGH (ref 2.5–4.6)
Potassium: 4.4 mmol/L (ref 3.5–5.1)
Sodium: 136 mmol/L (ref 135–145)

## 2020-07-08 LAB — GLUCOSE, CAPILLARY
Glucose-Capillary: 151 mg/dL — ABNORMAL HIGH (ref 70–99)
Glucose-Capillary: 220 mg/dL — ABNORMAL HIGH (ref 70–99)
Glucose-Capillary: 198 mg/dL — ABNORMAL HIGH (ref 70–99)
Glucose-Capillary: 194 mg/dL — ABNORMAL HIGH (ref 70–99)

## 2020-07-08 MED ORDER — METOPROLOL TARTRATE 5 MG/5ML IV SOLN
5.0000 mg | Freq: Once | INTRAVENOUS | Status: AC | PRN
  Administered 2020-07-08: 5 mg via INTRAVENOUS
  Filled 2020-07-08: qty 5

## 2020-07-08 MED ORDER — DOXERCALCIFEROL 4 MCG/2ML IV SOLN
INTRAVENOUS | Status: AC
  Filled 2020-07-08: qty 2

## 2020-07-08 NOTE — Plan of Care (Signed)
  Problem: Health Behavior/Discharge Planning: Goal: Ability to manage health-related needs will improve Outcome: Progressing   Problem: Activity: Goal: Risk for activity intolerance will decrease Outcome: Progressing   Problem: Cardiac: Goal: Ability to achieve and maintain adequate cardiopulmonary perfusion will improve Outcome: Progressing

## 2020-07-08 NOTE — Progress Notes (Signed)
   07/08/20 7897  Notify: Provider  Provider Name/Title Dr Anette Guarneri  Date Provider Notified 07/08/20  Time Provider Notified [phone]  Notification Type  (Secure Chat)  Notification Reason Change in status  Provider response See new orders (If HR >120, give prn metoprolol)  Date of Provider Response 07/08/20  Time of Provider Response 0400  Document  Patient Outcome Other (Comment) (Stable - if hr >120, will given prn IV Metoprolol)  Progress note created (see row info) Yes   Patient converted from NSR to AFIB with HR staying between 98  and up to 139.  Upon assessment, patient is sleeping,  Awakens easily.  No complaints of chest pain and no shortness of breath.  VS are stable.  MD notified.  Order received for IV metoprolol prn x 1 if HR greater than 120.  Patient currently in Yellow MEWS and Yellow MEWS protocol implemented.  Will continue to monitor patient.  Earleen Reaper RN

## 2020-07-08 NOTE — Progress Notes (Signed)
[facility] Progress Note   Subjective:    Seen in room. Tachycardic this am. No cp, sob.   Objective Vitals:   07/08/20 0245 07/08/20 0505 07/08/20 0718 07/08/20 0836  BP: 114/83 125/84 113/75 110/77  Pulse: (!) 118 (!) 132 (!) 132 (!) 134  Resp: 17 17 16    Temp: 98.4 F (36.9 C) 98.7 F (37.1 C) 98.1 F (36.7 C) 99.2 F (37.3 C)  TempSrc: Oral Oral Oral Oral  SpO2: 96% 96% 98% 96%  Weight:      Height:       Physical Exam General:Appears comfortable, in no acute distress Heart:Tachy but regular; S1 and S2. Lungs:On RA; Lungs clear throughout w/o wheezing, rhonchi, and rales Abdomen:Soft, round, non-tender, non-distended, active bowel sounds Extremities:No edema bilateral lower extremities Dialysis Access:R AVF (+) Bruit/Thrill   Filed Weights   07/06/20 1216 07/06/20 1536 07/07/20 0459  Weight: 68.4 kg 66.3 kg 66.3 kg    Intake/Output Summary (Last 24 hours) at 07/08/2020 0839 Last data filed at 07/08/2020 0800 Gross per 24 hour  Intake 1137 ml  Output 0 ml  Net 1137 ml    Additional Objective Labs: Basic Metabolic Panel: Recent Labs  Lab 07/04/20 1748 07/06/20 1637  NA 138 135  K 4.1 3.6  CL 101 99  CO2 26 27  GLUCOSE 151* 90  BUN 55* 39*  CREATININE 3.70* 3.03*  CALCIUM 7.8* 8.0*  PHOS 4.1 3.3   Liver Function Tests: Recent Labs  Lab 07/04/20 1748 07/06/20 1637  ALBUMIN 1.9* 1.9*   No results for input(s): LIPASE, AMYLASE in the last 168 hours. CBC: Recent Labs  Lab 07/04/20 1748 07/06/20 1637  WBC 7.7 5.9  HGB 10.4* 10.4*  HCT 34.3* 33.8*  MCV 91.7 90.1  PLT 65* 103*   Blood Culture    Component Value Date/Time   SDES BLOOD LEFT HAND 06/13/2020 0803   SDES BLOOD LEFT HAND 06/13/2020 0803   SPECREQUEST  06/13/2020 0803    BOTTLES DRAWN AEROBIC AND ANAEROBIC Blood Culture adequate volume   SPECREQUEST  06/13/2020 0803    BOTTLES DRAWN AEROBIC AND ANAEROBIC Blood Culture adequate volume   CULT  06/13/2020  0803    NO GROWTH 5 DAYS Performed at [facility], [place] [address], [place], [place] [address]    CULT  06/13/2020 0803    NO GROWTH 5 DAYS Performed at [facility] [address], [place], [place] [address]    REPTSTATUS 06/18/2020 FINAL 06/13/2020 0803   REPTSTATUS 06/18/2020 FINAL 06/13/2020 0803    CBG: Recent Labs  Lab 07/07/20 0636 07/07/20 1232 07/07/20 1653 07/07/20 2031 07/08/20 0645  GLUCAP 121* 134* 135* 168* 151*   Iron Studies: No results for input(s): IRON, TIBC, TRANSFERRIN, FERRITIN in the last 72 hours. Lab Results  Component Value Date   INR 1.2 01/04/2020   INR 1.2 10/28/2018   INR 1.25 06/14/2013    Medications: . sodium chloride     . apixaban  5 mg Oral BID  . aspirin  81 mg Oral Daily  . atorvastatin  10 mg Oral Q M,W,F  . Chlorhexidine Gluconate Cloth  6 each Topical Q0600  . doxercalciferol  2 mcg Intravenous Q T,Th,Sa-HD  . feeding supplement (NEPRO CARB STEADY)  237 mL Oral TID WC  . hydrALAZINE  10 mg Oral TID  . insulin aspart  0-15 Units Subcutaneous TID WC  . insulin aspart  0-5 Units Subcutaneous QHS  . insulin glargine  5  Units Subcutaneous Daily  . isosorbide mononitrate  15 mg Oral Daily  . lipase/protease/amylase  12,000 Units Oral TID AC  . magnesium chloride  2 tablet Oral BID  . metoprolol succinate  25 mg Oral Daily  . pantoprazole  80 mg Oral Daily  . predniSONE  5 mg Oral Q breakfast  . sertraline  50 mg Oral QHS  . sevelamer carbonate  800 mg Oral TID WC  . sodium bicarbonate  1,950 mg Oral QID  . sodium chloride flush  3 mL Intravenous Q12H  . tacrolimus  2 mg Oral BID  . tamsulosin  0.4 mg Oral QPC supper   Dialysis Orders: HD: TTS Duration: 3hrs 2K/2.25Ca/BFR 400/DFR 600 UF goal: 2-2.5L  Assessment/Plan: 1. Sepsis 2/2 Enterococcus bacteremia. Resolved. S/p IV antibiotics course. Echo neg for veg.  2. COVID-19/PNA: Tested (+) 06/11/20 currently out of isolation window. Continue working with PT/OT  with anticipated d/c to inpatient rehab. 3 ESRD: On HD TTS. HD today on schedule.  4. Anemiaof CKD: Hgb stable. Continue Darbepoetin IV weekly 5. Secondary hyperparathyroidism- Ca/Phos at goal. Continue VDRA and binders. 6.HTN/volume- BP controlled. No signs for volume overload at this time. Scheduled for HD tomorrow 07/08/20. BP trend improving. Continue Hydralazine, Metoprolol, and Imdur. 7. Nutrition- Continue renal diet; Albumin 1.9, continue Nepro supplement for low albumin  8. Tachy/brady syndrome - H/o AFib/RVR. - On metoprolol, Eliquis.  Not symptomatic. Monitor.    Lynnda Child PA-C [facility] 07/08/2020,8:40 AM

## 2020-07-08 NOTE — Progress Notes (Signed)
[facility]  PROGRESS NOTE  BEACHER EVERY BZJ:[id number] DOB: 1955-12-29 DOA: 06/11/2020 PCP: [name], MD   Brief HPI:   65 year old male with past medical history of atrial fibrillation on Eliquis, ESRD with failed renal transplant, on dialysis, TTS, diabetes mellitus type 2, chronic diastolic heart failure came to ED with complaints of dyspnea in setting of missed dialysis.  Was found to have Covid positive, also Enterococcus bacteremia.  2D echo was done which showed severely reduced EF.  CIR evaluation done, patient has been accepted for CIR but no beds available.    Subjective   Patient seen and examined, denies any complaints.  Has tachycardia.   Assessment/Plan:     1. Acute respiratory failure with hypoxemia-secondary to fluid overload, resolved with hemodialysis. 2. Sepsis due to bacteremia/Enterococcus-patient had Enterococcus bacteremia likely urinary source.  2D echo showed no evidence of vegetation.  Dr. Olevia Bowens discussed with ID who recommended 2 weeks of IV antibiotics.  Antibiotics course has been completed in the hospital.   3. Acute metabolic encephalopathy-resolved likely from sepsis as above. 4. COVID-19 pneumonia-he was treated with remdesivir for 5 days, steroids weaned off.  Patient has completed isolation.  Symptoms have resolved. 5. ESRD on hemodialysis, failed renal transplant.  Patient on hemodialysis Tuesday Thursday Saturday.  Continue sevelamer, sodium bicarbonate, tacrolimus.  Nephrology following. 6. Diabetes mellitus type 2-CBG less than 100s, will decrease Lantus to 5 units subcu daily.  Continue sliding scale insulin with NovoLog.   7. Paroxysmal atrial fibrillation-patient was seen by cardiology for episodes of tachycardia with hemodialysis.  EKG showed atrial flutter with variable block.  Initially was started on IV Cardizem but as patient converted to normal sinus rhythm Cardizem was stopped.  Will obtain EKG.  We will hold hydralazine for now,  as it can worsen tachycardia.[name] 8. Dysphagia-speech therapy was consulted, patient is on regular consistency diet. 9. Depression-continue Zoloft 10. Hyperlipidemia-continue Lipitor 11. Hypertension-blood pressure controlled on metoprolol, Imdur.     COVID-19 Labs  No results for input(s): DDIMER, FERRITIN, LDH, CRP in the last 72 hours.  Lab Results  Component Value Date   SARSCOV2NAA POSITIVE (A) 06/11/2020   [place] NEGATIVE 12/31/2019   [place] NEGATIVE 10/12/2019   [place] NEGATIVE 04/23/2019     Scheduled medications:   . apixaban  5 mg Oral BID  . aspirin  81 mg Oral Daily  . atorvastatin  10 mg Oral Q M,W,F  . Chlorhexidine Gluconate Cloth  6 each Topical Q0600  . doxercalciferol  2 mcg Intravenous Q T,Th,Sa-HD  . feeding supplement (NEPRO CARB STEADY)  237 mL Oral TID WC  . hydrALAZINE  10 mg Oral TID  . insulin aspart  0-15 Units Subcutaneous TID WC  . insulin aspart  0-5 Units Subcutaneous QHS  . insulin glargine  5 Units Subcutaneous Daily  . isosorbide mononitrate  15 mg Oral Daily  . lipase/protease/amylase  12,000 Units Oral TID AC  . magnesium chloride  2 tablet Oral BID  . metoprolol succinate  25 mg Oral Daily  . pantoprazole  80 mg Oral Daily  . predniSONE  5 mg Oral Q breakfast  . sertraline  50 mg Oral QHS  . sevelamer carbonate  800 mg Oral TID WC  . sodium bicarbonate  1,950 mg Oral QID  . sodium chloride flush  3 mL Intravenous Q12H  . tacrolimus  2 mg Oral BID  . tamsulosin  0.4 mg Oral QPC supper         CBG: Recent  Labs  Lab 07/07/20 1232 07/07/20 1653 07/07/20 2031 07/08/20 0645 07/08/20 1223  GLUCAP 134* 135* 168* 151* 220*    SpO2: 96 % O2 Flow Rate (L/min): 1 L/min    CBC: Recent Labs  Lab 07/04/20 1748 07/06/20 1637  WBC 7.7 5.9  HGB 10.4* 10.4*  HCT 34.3* 33.8*  MCV 91.7 90.1  PLT 65* 103*    Basic Metabolic Panel: Recent Labs  Lab 07/04/20 1748 07/06/20 1637  NA 138 135  K 4.1 3.6  CL 101  99  CO2 26 27  GLUCOSE 151* 90  BUN 55* 39*  CREATININE 3.70* 3.03*  CALCIUM 7.8* 8.0*  PHOS 4.1 3.3     Liver Function Tests: Recent Labs  Lab 07/04/20 1748 07/06/20 1637  ALBUMIN 1.9* 1.9*     Antibiotics: Anti-infectives (From admission, onward)   Start     Dose/Rate Route Frequency Ordered Stop   06/24/20 1319  Vancomycin (VANCOCIN) 750-5 MG/150ML-% IVPB       Note to Pharmacy: Judieth Keens  : cabinet override      06/24/20 1319 06/24/20 1323   06/22/20 1200  vancomycin (VANCOCIN) IVPB 750 mg/150 ml premix        750 mg 150 mL/hr over 60 Minutes Intravenous Every T-Th-Sa (Hemodialysis) 06/21/20 1407 06/27/20 0958   06/21/20 1200  vancomycin (VANCOCIN) IVPB 750 mg/150 ml premix  Status:  Discontinued        750 mg 150 mL/hr over 60 Minutes Intravenous Every M-W-F (Hemodialysis) 06/20/20 0904 06/21/20 1407   06/20/20 1200  vancomycin (VANCOCIN) IVPB 750 mg/150 ml premix  Status:  Discontinued        750 mg 150 mL/hr over 60 Minutes Intravenous Every T-Th-Sa (Hemodialysis) 06/18/20 0819 06/19/20 0921   06/19/20 1200  vancomycin (VANCOCIN) IVPB 750 mg/150 ml premix        750 mg 150 mL/hr over 60 Minutes Intravenous Once in dialysis 06/19/20 0921 06/19/20 1657   06/17/20 1200  vancomycin (VANCOCIN) IVPB 500 mg/100 ml premix        500 mg 100 mL/hr over 60 Minutes Intravenous  Once 06/17/20 0811 06/17/20 1333   06/16/20 1200  vancomycin (VANCOCIN) IVPB 750 mg/150 ml premix        750 mg 150 mL/hr over 60 Minutes Intravenous  Once 06/15/20 1838 06/16/20 1626   06/13/20 1200  vancomycin (VANCOREADY) IVPB 750 mg/150 mL  Status:  Discontinued        750 mg 150 mL/hr over 60 Minutes Intravenous Every T-Th-Sa (Hemodialysis) 06/12/20 1202 06/17/20 0811   06/12/20 1245  vancomycin (VANCOREADY) IVPB 1750 mg/350 mL        1,750 mg 175 mL/hr over 120 Minutes Intravenous  Once 06/12/20 1158 06/12/20 1552   06/12/20 1000  remdesivir 100 mg in sodium chloride 0.9 % 100 mL IVPB        "Followed by" Linked Group Details   100 mg 200 mL/hr over 30 Minutes Intravenous Daily 06/11/20 1719 06/15/20 0902   06/11/20 2200  remdesivir 200 mg in sodium chloride 0.9% 250 mL IVPB       "Followed by" Linked Group Details   200 mg 580 mL/hr over 30 Minutes Intravenous Once 06/11/20 1719 06/12/20 0430       DVT prophylaxis: Eliquis  Code Status: Full code  Family Communication: No family at bedside   Consultants:  Nephrology  Procedures:      Objective   Vitals:   07/08/20 5176 07/08/20 0505 07/08/20 1607 07/08/20 3710  BP: 114/83 125/84 113/75 110/77  Pulse: (!) 118 (!) 132 (!) 132 (!) 134  Resp: 17 17 16 17   Temp: 98.4 F (36.9 C) 98.7 F (37.1 C) 98.1 F (36.7 C) 99.2 F (37.3 C)  TempSrc: Oral Oral Oral Oral  SpO2: 96% 96% 98% 96%  Weight:      Height:        Intake/Output Summary (Last 24 hours) at 07/08/2020 1251 Last data filed at 07/08/2020 1248 Gross per 24 hour  Intake 1617 ml  Output 0 ml  Net 1617 ml    02/17 1901 - 02/19 0700 In: 7741 [P.O.:1077] Out: 0   Filed Weights   07/06/20 1216 07/06/20 1536 07/07/20 0459  Weight: 68.4 kg 66.3 kg 66.3 kg    Physical Examination:    General-appears in no acute distress  Heart-S1-S2, irregular, no murmur auscultated  Lungs-clear to auscultation bilaterally, no wheezing or crackles auscultated  Abdomen-soft, nontender, no organomegaly  Extremities-no edema in the lower extremities  Neuro-alert, oriented x3, no focal deficit noted   Status is: Inpatient  Dispo: The patient is from: Home              Anticipated d/c is to: Skilled nursing facility              Anticipated d/c date is: 07/10/2020              Patient currently stable for discharge  Barrier to discharge-awaiting bed at skilled nursing facility  Pressure Injury 01/18/20 Sacrum Stage 2 -  Partial thickness loss of dermis presenting as a shallow open injury with a red, pink wound bed without slough. (Active)   01/18/20 2251  Location: Sacrum  Location Orientation:   Staging: Stage 2 -  Partial thickness loss of dermis presenting as a shallow open injury with a red, pink wound bed without slough.  Wound Description (Comments):   Present on Admission: Yes           Data Reviewed:   No results found for this or any previous visit (from the past 240 hour(s)).  No results for input(s): LIPASE, AMYLASE in the last 168 hours. No results for input(s): AMMONIA in the last 168 hours.  Cardiac Enzymes: No results for input(s): CKTOTAL, CKMB, CKMBINDEX, TROPONINI in the last 168 hours. BNP (last 3 results) Recent Labs    10/12/19 0011 06/11/20 1326  BNP 1,980.9* >4,500.0*    ProBNP (last 3 results) No results for input(s): PROBNP in the last 8760 hours.  Studies:  No results found.     Oswald Hillock   [facility] If 7PM-7AM, please contact night-coverage at [email or web address], Office  [phone]   07/08/2020, 12:51 PM  LOS: 27 days

## 2020-07-09 DIAGNOSIS — U071 COVID-19: Secondary | ICD-10-CM | POA: Diagnosis not present

## 2020-07-09 DIAGNOSIS — B952 Enterococcus as the cause of diseases classified elsewhere: Secondary | ICD-10-CM | POA: Diagnosis not present

## 2020-07-09 DIAGNOSIS — E1159 Type 2 diabetes mellitus with other circulatory complications: Secondary | ICD-10-CM | POA: Diagnosis not present

## 2020-07-09 DIAGNOSIS — R7881 Bacteremia: Secondary | ICD-10-CM | POA: Diagnosis not present

## 2020-07-09 LAB — GLUCOSE, CAPILLARY
Glucose-Capillary: 236 mg/dL — ABNORMAL HIGH (ref 70–99)
Glucose-Capillary: 271 mg/dL — ABNORMAL HIGH (ref 70–99)
Glucose-Capillary: 152 mg/dL — ABNORMAL HIGH (ref 70–99)
Glucose-Capillary: 153 mg/dL — ABNORMAL HIGH (ref 70–99)

## 2020-07-09 MED ORDER — METOPROLOL SUCCINATE ER 50 MG PO TB24
50.0000 mg | ORAL_TABLET | Freq: Every day | ORAL | Status: DC
  Administered 2020-07-09 – 2020-07-19 (×11): 50 mg via ORAL
  Filled 2020-07-09 (×12): qty 1

## 2020-07-09 MED ORDER — CYCLOBENZAPRINE HCL 5 MG PO TABS
5.0000 mg | ORAL_TABLET | Freq: Three times a day (TID) | ORAL | Status: DC | PRN
  Administered 2020-07-09 – 2020-07-11 (×3): 5 mg via ORAL
  Filled 2020-07-09 (×3): qty 1

## 2020-07-09 MED ORDER — DARBEPOETIN ALFA 60 MCG/0.3ML IJ SOSY
60.0000 ug | PREFILLED_SYRINGE | INTRAMUSCULAR | Status: DC
  Administered 2020-07-11: 60 ug via INTRAVENOUS
  Filled 2020-07-09: qty 0.3

## 2020-07-09 MED ORDER — INSULIN ASPART 100 UNIT/ML ~~LOC~~ SOLN
3.0000 [IU] | Freq: Three times a day (TID) | SUBCUTANEOUS | Status: DC
  Administered 2020-07-09 – 2020-07-10 (×2): 3 [IU] via SUBCUTANEOUS

## 2020-07-09 NOTE — Progress Notes (Signed)
[facility] Progress Note   Subjective:    Completed dialysis yesterday net UF 2.5L  No complaints this am. No cp, sob.    Objective Vitals:   07/08/20 1620 07/08/20 1728 07/08/20 2011 07/09/20 0515  BP: 122/86 115/71 (!) 174/147 130/60  Pulse: 82 (!) 105 100 95  Resp: (!) 25 16 18 18   Temp: (!) 97.5 F (36.4 C) 98.5 F (36.9 C) 99.5 F (37.5 C) 98.2 F (36.8 C)  TempSrc: Oral Oral Oral   SpO2: 96% 97% 95% 96%  Weight: 65.5 kg  65.5 kg   Height:       Physical Exam General:Appears comfortable, in no acute distress Heart:Tachy but regular; S1 and S2. Lungs:On RA; Lungs clear throughout w/o wheezing, rhonchi, and rales Abdomen:Soft, round, non-tender, non-distended, active bowel sounds Extremities:No edema bilateral lower extremities Dialysis Access:R AVF (+) Bruit/Thrill   Filed Weights   07/08/20 1305 07/08/20 1620 07/08/20 2011  Weight: 68.3 kg 65.5 kg 65.5 kg    Intake/Output Summary (Last 24 hours) at 07/09/2020 0833 Last data filed at 07/09/2020 0600 Gross per 24 hour  Intake 957 ml  Output 2500 ml  Net -1543 ml    Additional Objective Labs: Basic Metabolic Panel: Recent Labs  Lab 07/04/20 1748 07/06/20 1637 07/08/20 1431  NA 138 135 136  K 4.1 3.6 4.4  CL 101 99 97*  CO2 26 27 25   GLUCOSE 151* 90 267*  BUN 55* 39* 80*  CREATININE 3.70* 3.03* 4.83*  CALCIUM 7.8* 8.0* 7.9*  PHOS 4.1 3.3 5.1*   Liver Function Tests: Recent Labs  Lab 07/04/20 1748 07/06/20 1637 07/08/20 1431  ALBUMIN 1.9* 1.9* 1.8*   No results for input(s): LIPASE, AMYLASE in the last 168 hours. CBC: Recent Labs  Lab 07/04/20 1748 07/06/20 1637 07/08/20 1431  WBC 7.7 5.9 5.4  HGB 10.4* 10.4* 8.6*  HCT 34.3* 33.8* 28.7*  MCV 91.7 90.1 92.3  PLT 65* 103* 144*   Blood Culture    Component Value Date/Time   SDES BLOOD LEFT HAND 06/13/2020 0803   SDES BLOOD LEFT HAND 06/13/2020 0803   SPECREQUEST  06/13/2020 0803    BOTTLES DRAWN AEROBIC AND  ANAEROBIC Blood Culture adequate volume   SPECREQUEST  06/13/2020 0803    BOTTLES DRAWN AEROBIC AND ANAEROBIC Blood Culture adequate volume   CULT  06/13/2020 0803    NO GROWTH 5 DAYS Performed at [facility] [address], [place], [place] [address]    CULT  06/13/2020 0803    NO GROWTH 5 DAYS Performed at [facility] [address] [place], [place] [address]    REPTSTATUS 06/18/2020 FINAL 06/13/2020 0803   REPTSTATUS 06/18/2020 FINAL 06/13/2020 0803    CBG: Recent Labs  Lab 07/08/20 0645 07/08/20 1223 07/08/20 1738 07/08/20 2035 07/09/20 0631  GLUCAP 151* 220* 198* 194* 236*   Iron Studies: No results for input(s): IRON, TIBC, TRANSFERRIN, FERRITIN in the last 72 hours. Lab Results  Component Value Date   INR 1.2 01/04/2020   INR 1.2 10/28/2018   INR 1.25 06/14/2013    Medications: . sodium chloride     . apixaban  5 mg Oral BID  . aspirin  81 mg Oral Daily  . atorvastatin  10 mg Oral Q M,W,F  . Chlorhexidine Gluconate Cloth  6 each Topical Q0600  . doxercalciferol  2 mcg Intravenous Q T,Th,Sa-HD  . feeding supplement (NEPRO CARB STEADY)  237 mL Oral TID WC  . insulin aspart  0-15 Units Subcutaneous TID WC  . insulin aspart  0-5 Units Subcutaneous QHS  . insulin glargine  5 Units Subcutaneous Daily  . isosorbide mononitrate  15 mg Oral Daily  . lipase/protease/amylase  12,000 Units Oral TID AC  . magnesium chloride  2 tablet Oral BID  . metoprolol succinate  25 mg Oral Daily  . pantoprazole  80 mg Oral Daily  . predniSONE  5 mg Oral Q breakfast  . sertraline  50 mg Oral QHS  . sevelamer carbonate  800 mg Oral TID WC  . sodium bicarbonate  1,950 mg Oral QID  . sodium chloride flush  3 mL Intravenous Q12H  . tacrolimus  2 mg Oral BID  . tamsulosin  0.4 mg Oral QPC supper   Dialysis Orders: HD: TTS Duration: 3hrs 2K/2.25Ca/BFR 400/DFR 600 UF goal: 2-2.5L  Assessment/Plan: 1. Sepsis 2/2 Enterococcus bacteremia. Resolved. S/p IV  antibiotics course. Echo neg for veg.  2. COVID-19/PNA: Tested (+) 06/11/20 currently out of isolation window. Continue working with PT/OT with anticipated d/c to inpatient rehab. 3 ESRD: On HD TTS. Next HD 2/22.  4. Anemiaof CKD: Hgb 8.6 . Resume ESA with next HD  5. Secondary hyperparathyroidism- Ca/Phos at goal. Continue VDRA and binders. 6.HTN/volume- BP controlled. No signs for volume overload at this time.  Continue Hydralazine, Metoprolol, and Imdur. 7. Nutrition- Continue renal diet; Albumin 1.9, continue Nepro supplement for low albumin  8. Tachy/brady syndrome - H/o AFib/RVR. - On metoprolol, Eliquis.  Not symptomatic. Monitor.    Lynnda Child PA-C [facility] 07/09/2020,8:33 AM

## 2020-07-09 NOTE — Progress Notes (Addendum)
[facility]  PROGRESS NOTE  Paul [name] BSJ:[id number] DOB: [birth year] DOA: 06/11/2020 PCP: [name], MD   Brief HPI:   65 year old male with past medical history of atrial fibrillation on Eliquis, ESRD with failed renal transplant, on dialysis, TTS, diabetes mellitus type 2, chronic diastolic heart failure came to ED with complaints of dyspnea in setting of missed dialysis.  Was found to have Covid positive, also Enterococcus bacteremia.  2D echo was done which showed severely reduced EF.  CIR evaluation done, patient has been accepted for CIR but no beds available.    Subjective   Patient seen and examined, EKG done yesterday showed atrial flutter.  Denies palpitations this morning.  Does have intermittent chest wall pain.   Assessment/Plan:     1. Acute respiratory failure with hypoxemia-secondary to fluid overload, resolved with hemodialysis. 2. Sepsis due to bacteremia/Enterococcus-patient had Enterococcus bacteremia likely urinary source.  2D echo showed no evidence of vegetation.  Dr. Olevia Bowens discussed with ID who recommended 2 weeks of IV antibiotics.  Antibiotics course has been completed in the hospital.   3. Acute metabolic encephalopathy-resolved likely from sepsis as above. 4. COVID-19 pneumonia-he was treated with remdesivir for 5 days, steroids weaned off.  Patient has completed isolation.  Symptoms have resolved. 5. ESRD on hemodialysis, failed renal transplant.  Patient on hemodialysis Tuesday Thursday Saturday.  Continue sevelamer, sodium bicarbonate, tacrolimus.  Nephrology following. 6. Diabetes mellitus type 2-CBG is now elevated to 200s.  Will add 3 units NovoLog 3 times daily meal coverage.  Continue Lantus 5 units subcu daily.  Continue sliding scale insulin with NovoLog.   7. Atrial flutter-went into atrial flutter with rapid rate yesterday.  Heart rate is controlled this morning.  Patient is on metoprolol 25 mg daily.  Will increase dose of metoprolol to  50 mg daily.  Hydralazine was discontinued yesterday due to tachycardia.  Continue anticoagulation with Eliquis.  Patient was seen by cardiology for episodes of tachycardia with hemodialysis.  EKG showed atrial flutter with variable block.  Initially was started on IV Cardizem but as patient converted to normal sinus rhythm Cardizem was stopped.  8. Dysphagia-speech therapy was consulted, patient is on regular consistency diet. 9. Depression-continue Zoloft 10. Hyperlipidemia-continue Lipitor 11. Hypertension-blood pressure controlled on metoprolol, Imdur.     COVID-19 Labs  No results for input(s): DDIMER, FERRITIN, LDH, CRP in the last 72 hours.  Lab Results  Component Value Date   SARSCOV2NAA POSITIVE (A) 06/11/2020   [place] NEGATIVE 12/31/2019   [place] NEGATIVE 10/12/2019   [place] NEGATIVE 04/23/2019     Scheduled medications:   . apixaban  5 mg Oral BID  . aspirin  81 mg Oral Daily  . atorvastatin  10 mg Oral Q M,W,F  . Chlorhexidine Gluconate Cloth  6 each Topical Q0600  . [START ON 07/11/2020] darbepoetin (ARANESP) injection - DIALYSIS  60 mcg Intravenous Q Tue-HD  . doxercalciferol  2 mcg Intravenous Q T,Th,Sa-HD  . feeding supplement (NEPRO CARB STEADY)  237 mL Oral TID WC  . insulin aspart  0-15 Units Subcutaneous TID WC  . insulin aspart  0-5 Units Subcutaneous QHS  . insulin glargine  5 Units Subcutaneous Daily  . isosorbide mononitrate  15 mg Oral Daily  . lipase/protease/amylase  12,000 Units Oral TID AC  . magnesium chloride  2 tablet Oral BID  . metoprolol succinate  50 mg Oral Daily  . pantoprazole  80 mg Oral Daily  . predniSONE  5 mg Oral Q breakfast  .  sertraline  50 mg Oral QHS  . sevelamer carbonate  800 mg Oral TID WC  . sodium chloride flush  3 mL Intravenous Q12H  . tacrolimus  2 mg Oral BID  . tamsulosin  0.4 mg Oral QPC supper         CBG: Recent Labs  Lab 07/08/20 0645 07/08/20 1223 07/08/20 1738 07/08/20 2035  07/09/20 0631  GLUCAP 151* 220* 198* 194* 236*    SpO2: 96 % O2 Flow Rate (L/min): 1 L/min    CBC: Recent Labs  Lab 07/04/20 1748 07/06/20 1637 07/08/20 1431  WBC 7.7 5.9 5.4  HGB 10.4* 10.4* 8.6*  HCT 34.3* 33.8* 28.7*  MCV 91.7 90.1 92.3  PLT 65* 103* 144*    Basic Metabolic Panel: Recent Labs  Lab 07/04/20 1748 07/06/20 1637 07/08/20 1431  NA 138 135 136  K 4.1 3.6 4.4  CL 101 99 97*  CO2 26 27 25   GLUCOSE 151* 90 267*  BUN 55* 39* 80*  CREATININE 3.70* 3.03* 4.83*  CALCIUM 7.8* 8.0* 7.9*  PHOS 4.1 3.3 5.1*     Liver Function Tests: Recent Labs  Lab 07/04/20 1748 07/06/20 1637 07/08/20 1431  ALBUMIN 1.9* 1.9* 1.8*     Antibiotics: Anti-infectives (From admission, onward)   Start     Dose/Rate Route Frequency Ordered Stop   06/24/20 1319  Vancomycin (VANCOCIN) 750-5 MG/150ML-% IVPB       Note to Pharmacy: Judieth Keens  : cabinet override      06/24/20 1319 06/24/20 1323   06/22/20 1200  vancomycin (VANCOCIN) IVPB 750 mg/150 ml premix        750 mg 150 mL/hr over 60 Minutes Intravenous Every T-Th-Sa (Hemodialysis) 06/21/20 1407 06/27/20 0958   06/21/20 1200  vancomycin (VANCOCIN) IVPB 750 mg/150 ml premix  Status:  Discontinued        750 mg 150 mL/hr over 60 Minutes Intravenous Every M-W-F (Hemodialysis) 06/20/20 0904 06/21/20 1407   06/20/20 1200  vancomycin (VANCOCIN) IVPB 750 mg/150 ml premix  Status:  Discontinued        750 mg 150 mL/hr over 60 Minutes Intravenous Every T-Th-Sa (Hemodialysis) 06/18/20 0819 06/19/20 0921   06/19/20 1200  vancomycin (VANCOCIN) IVPB 750 mg/150 ml premix        750 mg 150 mL/hr over 60 Minutes Intravenous Once in dialysis 06/19/20 0921 06/19/20 1657   06/17/20 1200  vancomycin (VANCOCIN) IVPB 500 mg/100 ml premix        500 mg 100 mL/hr over 60 Minutes Intravenous  Once 06/17/20 0811 06/17/20 1333   06/16/20 1200  vancomycin (VANCOCIN) IVPB 750 mg/150 ml premix        750 mg 150 mL/hr over 60 Minutes  Intravenous  Once 06/15/20 1838 06/16/20 1626   06/13/20 1200  vancomycin (VANCOREADY) IVPB 750 mg/150 mL  Status:  Discontinued        750 mg 150 mL/hr over 60 Minutes Intravenous Every T-Th-Sa (Hemodialysis) 06/12/20 1202 06/17/20 0811   06/12/20 1245  vancomycin (VANCOREADY) IVPB 1750 mg/350 mL        1,750 mg 175 mL/hr over 120 Minutes Intravenous  Once 06/12/20 1158 06/12/20 1552   06/12/20 1000  remdesivir 100 mg in sodium chloride 0.9 % 100 mL IVPB       "Followed by" Linked Group Details   100 mg 200 mL/hr over 30 Minutes Intravenous Daily 06/11/20 1719 06/15/20 0902   06/11/20 2200  remdesivir 200 mg in sodium chloride 0.9% 250 mL IVPB       "  Followed by" Linked Group Details   200 mg 580 mL/hr over 30 Minutes Intravenous Once 06/11/20 1719 06/12/20 0430       DVT prophylaxis: Eliquis  Code Status: Full code  Family Communication: No family at bedside   Consultants:  Nephrology  Procedures:      Objective   Vitals:   07/08/20 1620 07/08/20 1728 07/08/20 2011 07/09/20 0515  BP: 122/86 115/71 (!) 174/147 130/60  Pulse: 82 (!) 105 100 95  Resp: (!) 25 16 18 18   Temp: (!) 97.5 F (36.4 C) 98.5 F (36.9 C) 99.5 F (37.5 C) 98.2 F (36.8 C)  TempSrc: Oral Oral Oral   SpO2: 96% 97% 95% 96%  Weight: 65.5 kg  65.5 kg   Height:        Intake/Output Summary (Last 24 hours) at 07/09/2020 1126 Last data filed at 07/09/2020 0934 Gross per 24 hour  Intake 1194 ml  Output 2500 ml  Net -1306 ml    02/18 1901 - 02/20 0700 In: 3291 [P.O.:1617] Out: 2500   Filed Weights   07/08/20 1305 07/08/20 1620 07/08/20 2011  Weight: 68.3 kg 65.5 kg 65.5 kg    Physical Examination:   General-appears in no acute distress  Heart-S1-S2, irregular, no murmur auscultated  Lungs-clear to auscultation bilaterally, no wheezing or crackles auscultated  Abdomen-soft, nontender, no organomegaly  Extremities-bilateral BKA  Neuro-alert, oriented x3, no focal deficit  noted   Status is: Inpatient  Dispo: The patient is from: Home              Anticipated d/c is to: Skilled nursing facility              Anticipated d/c date is: 07/10/2020              Patient currently stable for discharge  Barrier to discharge-awaiting bed at skilled nursing facility  Pressure Injury 01/18/20 Sacrum Stage 2 -  Partial thickness loss of dermis presenting as a shallow open injury with a red, pink wound bed without slough. (Active)  01/18/20 2251  Location: Sacrum  Location Orientation:   Staging: Stage 2 -  Partial thickness loss of dermis presenting as a shallow open injury with a red, pink wound bed without slough.  Wound Description (Comments):   Present on Admission: Yes           Data Reviewed:   No results found for this or any previous visit (from the past 240 hour(s)).  No results for input(s): LIPASE, AMYLASE in the last 168 hours. No results for input(s): AMMONIA in the last 168 hours.  Cardiac Enzymes: No results for input(s): CKTOTAL, CKMB, CKMBINDEX, TROPONINI in the last 168 hours. BNP (last 3 results) Recent Labs    10/12/19 0011 06/11/20 1326  BNP 1,980.9* >4,500.0*    ProBNP (last 3 results) No results for input(s): PROBNP in the last 8760 hours.  Studies:  No results found.     Oswald Hillock   [facility] If 7PM-7AM, please contact night-coverage at [email or web address], Office  [phone]   07/09/2020, 11:26 AM  LOS: 28 days

## 2020-07-09 NOTE — Progress Notes (Addendum)
Occupational Therapy Treatment Patient Details Name: Paul [name] MRN: [id number] DOB: Sep 21, 1955 Today's Date: 07/09/2020    History of present illness 65 yo male presenting to ED via EMS after missing dialysis and weakness with COVID-19 +. Off COVID precautions on 2/3. PMH including HTN, DM2, renal transplant, CKD st IV, calciphylaxis, A-Fib, bil. TTAs, bilateral BKA with prosthesis,and TIA.   OT comments  Pt. Seen for skilled OT treatment. Bed mobility with use of bed rails rolling L/R for peri care total a.  Able to utilize rolling L/R for donning briefs with no physical assistance.  ub bathing/dressing min a with hob elevated to chair position.  Attempted sidelying to sit pt. Unable to maintain secondary to reported fatigue.  Remains motivated for participation in therapies.  Cont. With bed mobility next session in prep for increasing sitting balance as precursor for functional mobility for adl completion.    Follow Up Recommendations  CIR;Supervision/Assistance - 24 hour    Equipment Recommendations  None recommended by OT;Other (comment)    Recommendations for Other Services Rehab consult    Precautions / Restrictions Precautions Precautions: Fall;Other (comment) Precaution Comments: B BKA with prosthetics in room now, mod fall Restrictions Weight Bearing Restrictions: No       Mobility Bed Mobility Overal bed mobility: Needs Assistance Bed Mobility: Rolling;Sidelying to Sit Rolling: Modified independent (Device/Increase time) Sidelying to sit: Max assist;Total assist       General bed mobility comments: rolling multiple times for BM clean up and also for donning his briefs. attempted sidelying to sit, heavy max/total a and pt. unable to bring trunk upright and sustain sitting, requested to have HOB elevated to chair like positioning for UB b/d  Transfers                      Balance                                           ADL either  performed or assessed with clinical judgement   ADL Overall ADL's : Needs assistance/impaired     Grooming: Wash/dry hands;Wash/dry face;Bed level;Set up   Upper Body Bathing: Set up;Sitting;Bed level Upper Body Bathing Details (indicate cue type and reason): bed placed in upright position Lower Body Bathing: Total assistance;Bed level Lower Body Bathing Details (indicate cue type and reason): Total A, incontinent of large BM in brief that had also gotten on bed pad. assisted with pt. for clean up Upper Body Dressing : Minimal assistance;Sitting;Bed level   Lower Body Dressing: Set up;Bed level Lower Body Dressing Details (indicate cue type and reason): pt.able to don disposable briefs over each hip rolling L/R in bed with use of bed rail and pull up after donning over each LE               General ADL Comments: attempted sidelying to sit and pt. unable to bring trunk upright with max/total a and requested to completed ub tasks with hob elevated to chair like position instead     Vision       Perception     Praxis      Cognition Arousal/Alertness: Awake/alert Behavior During Therapy: Flat affect Overall Cognitive Status: No family/caregiver present to determine baseline cognitive functioning  Exercises     Shoulder Instructions       General Comments  [employer] fan, eager for baseball to start    Pertinent Vitals/ Pain       Pain Assessment: No/denies pain  Home Living                                          Prior Functioning/Environment              Frequency  Min 2X/week        Progress Toward Goals  OT Goals(current goals can now be found in the care plan section)  Progress towards OT goals: Progressing toward goals     Plan Discharge plan remains appropriate;Frequency remains appropriate    Co-evaluation                 AM-PAC OT "6 Clicks" Daily  Activity     Outcome Measure   Help from another person eating meals?: None Help from another person taking care of personal grooming?: A Little Help from another person toileting, which includes using toliet, bedpan, or urinal?: A Lot Help from another person bathing (including washing, rinsing, drying)?: A Lot Help from another person to put on and taking off regular upper body clothing?: A Little Help from another person to put on and taking off regular lower body clothing?: A Little 6 Click Score: 17    End of Session    OT Visit Diagnosis: Other abnormalities of gait and mobility (R26.89);Muscle weakness (generalized) (M62.81)   Activity Tolerance Patient tolerated treatment well   Patient Left in bed;with call bell/phone within reach;with bed alarm set   Nurse Communication Other (comment) (notified rn pt. had, had large BM) and pt. Requesting to wash hair today.        Time: 1021-1173 OT Time Calculation (min): 29 min  Charges: OT General Charges $OT Visit: 1 Visit OT Treatments $Self Care/Home Management : 23-37 mins  Sonia Baller, COTA/L Acute Rehabilitation [phone]   Janice Coffin 07/09/2020, 11:57 AM

## 2020-07-09 NOTE — Plan of Care (Signed)
  Problem: Health Behavior/Discharge Planning: Goal: Ability to manage health-related needs will improve Outcome: Progressing   Problem: Activity: Goal: Risk for activity intolerance will decrease 07/09/2020 0751 by Dolores Hoose, RN Outcome: Progressing 07/08/2020 1818 by Dolores Hoose, RN Outcome: Progressing   Problem: Education: Goal: Understanding of medication regimen will improve Outcome: Progressing   Problem: Activity: Goal: Ability to tolerate increased activity will improve Outcome: Progressing   Problem: Cardiac: Goal: Ability to achieve and maintain adequate cardiopulmonary perfusion will improve 07/09/2020 0751 by Dolores Hoose, RN Outcome: Progressing 07/08/2020 1818 by Dolores Hoose, RN Outcome: Progressing

## 2020-07-10 DIAGNOSIS — E1159 Type 2 diabetes mellitus with other circulatory complications: Secondary | ICD-10-CM | POA: Diagnosis not present

## 2020-07-10 DIAGNOSIS — R7881 Bacteremia: Secondary | ICD-10-CM | POA: Diagnosis not present

## 2020-07-10 DIAGNOSIS — B952 Enterococcus as the cause of diseases classified elsewhere: Secondary | ICD-10-CM | POA: Diagnosis not present

## 2020-07-10 DIAGNOSIS — U071 COVID-19: Secondary | ICD-10-CM | POA: Diagnosis not present

## 2020-07-10 LAB — GLUCOSE, CAPILLARY
Glucose-Capillary: 78 mg/dL (ref 70–99)
Glucose-Capillary: 226 mg/dL — ABNORMAL HIGH (ref 70–99)
Glucose-Capillary: 64 mg/dL — ABNORMAL LOW (ref 70–99)
Glucose-Capillary: 134 mg/dL — ABNORMAL HIGH (ref 70–99)
Glucose-Capillary: 233 mg/dL — ABNORMAL HIGH (ref 70–99)
Glucose-Capillary: 120 mg/dL — ABNORMAL HIGH (ref 70–99)

## 2020-07-10 MED ORDER — INSULIN ASPART 100 UNIT/ML ~~LOC~~ SOLN
2.0000 [IU] | Freq: Three times a day (TID) | SUBCUTANEOUS | Status: DC
  Administered 2020-07-10 – 2020-07-12 (×5): 2 [IU] via SUBCUTANEOUS

## 2020-07-10 MED ORDER — INSULIN ASPART 100 UNIT/ML ~~LOC~~ SOLN
0.0000 [IU] | Freq: Three times a day (TID) | SUBCUTANEOUS | Status: DC
  Administered 2020-07-10: 2 [IU] via SUBCUTANEOUS
  Administered 2020-07-11: 1 [IU] via SUBCUTANEOUS
  Administered 2020-07-12: 2 [IU] via SUBCUTANEOUS
  Administered 2020-07-12 – 2020-07-13 (×2): 4 [IU] via SUBCUTANEOUS
  Administered 2020-07-13: 1 [IU] via SUBCUTANEOUS
  Administered 2020-07-14: 2 [IU] via SUBCUTANEOUS
  Administered 2020-07-14: 1 [IU] via SUBCUTANEOUS
  Administered 2020-07-15: 4 [IU] via SUBCUTANEOUS
  Administered 2020-07-15: 2 [IU] via SUBCUTANEOUS
  Administered 2020-07-16: 1 [IU] via SUBCUTANEOUS
  Administered 2020-07-16: 2 [IU] via SUBCUTANEOUS
  Administered 2020-07-16 – 2020-07-17 (×2): 1 [IU] via SUBCUTANEOUS
  Administered 2020-07-17: 3 [IU] via SUBCUTANEOUS
  Administered 2020-07-17: 1 [IU] via SUBCUTANEOUS
  Administered 2020-07-18: 5 [IU] via SUBCUTANEOUS
  Administered 2020-07-18: 1 [IU] via SUBCUTANEOUS
  Administered 2020-07-19 (×3): 2 [IU] via SUBCUTANEOUS
  Administered 2020-07-20: 1 [IU] via SUBCUTANEOUS

## 2020-07-10 NOTE — Progress Notes (Signed)
[facility]  PROGRESS NOTE  Paul [name] VVO:[id number] DOB: [birth year] DOA: 06/11/2020 PCP: [name], MD   Brief HPI:   65 year old male with past medical history of atrial fibrillation on Eliquis, ESRD with failed renal transplant, on dialysis, TTS, diabetes mellitus type 2, chronic diastolic heart failure came to ED with complaints of dyspnea in setting of missed dialysis.  Was found to have Covid positive, also Enterococcus bacteremia.  2D echo was done which showed severely reduced EF.  CIR evaluation done, patient has been accepted for CIR but no beds available.    Subjective    Patient seen and examined, denies any complaints.  He has history of A flutter with variable block.  Went into rapid rate yesterday, dose of metoprolol was increased to 50 mg daily.  Hydralazine was discontinued   Assessment/Plan:     1. Acute respiratory failure with hypoxemia-secondary to fluid overload, resolved with hemodialysis. 2. Sepsis due to bacteremia/Enterococcus-patient had Enterococcus bacteremia likely urinary source.  2D echo showed no evidence of vegetation.  Dr. Olevia Bowens discussed with ID who recommended 2 weeks of IV antibiotics.  Antibiotics course has been completed in the hospital.   3. Acute metabolic encephalopathy-resolved likely from sepsis as above. 4. COVID-19 pneumonia-he was treated with remdesivir for 5 days, steroids weaned off.  Patient has completed isolation.  Symptoms have resolved. 5. ESRD on hemodialysis, failed renal transplant.  Patient on hemodialysis Tuesday Thursday Saturday.  Continue sevelamer, sodium bicarbonate, tacrolimus.  Nephrology following. 6. Diabetes mellitus type 2-CBG is now elevated to 200s.  Will add 3 units NovoLog 3 times daily meal coverage.  Continue Lantus 5 units subcu daily.  Continue sliding scale insulin with NovoLog.   7. Atrial flutter-went into atrial flutter with rapid rate   Heart rate is controlled this morning.  Patient is on  increased dose of metoprolol  50 mg daily.  Hydralazine was discontinued yesterday due to tachycardia.  Continue anticoagulation with Eliquis.  Patient was seen by cardiology for episodes of tachycardia with hemodialysis.  EKG showed atrial flutter with variable block.  Initially was started on IV Cardizem but as patient converted to normal sinus rhythm Cardizem was stopped.  8. Dysphagia-speech therapy was consulted, patient is on regular consistency diet. 9. Depression-continue Zoloft 10. Hyperlipidemia-continue Lipitor 11. Hypertension-blood pressure controlled on metoprolol, Imdur.     COVID-19 Labs  No results for input(s): DDIMER, FERRITIN, LDH, CRP in the last 72 hours.  Lab Results  Component Value Date   SARSCOV2NAA POSITIVE (A) 06/11/2020   [place] NEGATIVE 12/31/2019   [place] NEGATIVE 10/12/2019   [place] NEGATIVE 04/23/2019     Scheduled medications:   . apixaban  5 mg Oral BID  . aspirin  81 mg Oral Daily  . atorvastatin  10 mg Oral Q M,W,F  . Chlorhexidine Gluconate Cloth  6 each Topical Q0600  . [START ON 07/11/2020] darbepoetin (ARANESP) injection - DIALYSIS  60 mcg Intravenous Q Tue-HD  . doxercalciferol  2 mcg Intravenous Q T,Th,Sa-HD  . feeding supplement (NEPRO CARB STEADY)  237 mL Oral TID WC  . insulin aspart  0-15 Units Subcutaneous TID WC  . insulin aspart  0-5 Units Subcutaneous QHS  . insulin aspart  3 Units Subcutaneous TID WC  . insulin glargine  5 Units Subcutaneous Daily  . isosorbide mononitrate  15 mg Oral Daily  . lipase/protease/amylase  12,000 Units Oral TID AC  . magnesium chloride  2 tablet Oral BID  . metoprolol succinate  50 mg Oral  Daily  . pantoprazole  80 mg Oral Daily  . predniSONE  5 mg Oral Q breakfast  . sertraline  50 mg Oral QHS  . sevelamer carbonate  800 mg Oral TID WC  . sodium chloride flush  3 mL Intravenous Q12H  . tacrolimus  2 mg Oral BID  . tamsulosin  0.4 mg Oral QPC supper         CBG: Recent  Labs  Lab 07/09/20 0631 07/09/20 1125 07/09/20 1649 07/09/20 2123 07/10/20 0653  GLUCAP 236* 271* 152* 153* 78    SpO2: 96 % O2 Flow Rate (L/min): 1 L/min    CBC: Recent Labs  Lab 07/04/20 1748 07/06/20 1637 07/08/20 1431  WBC 7.7 5.9 5.4  HGB 10.4* 10.4* 8.6*  HCT 34.3* 33.8* 28.7*  MCV 91.7 90.1 92.3  PLT 65* 103* 144*    Basic Metabolic Panel: Recent Labs  Lab 07/04/20 1748 07/06/20 1637 07/08/20 1431  NA 138 135 136  K 4.1 3.6 4.4  CL 101 99 97*  CO2 26 27 25   GLUCOSE 151* 90 267*  BUN 55* 39* 80*  CREATININE 3.70* 3.03* 4.83*  CALCIUM 7.8* 8.0* 7.9*  PHOS 4.1 3.3 5.1*     Liver Function Tests: Recent Labs  Lab 07/04/20 1748 07/06/20 1637 07/08/20 1431  ALBUMIN 1.9* 1.9* 1.8*     Antibiotics: Anti-infectives (From admission, onward)   Start     Dose/Rate Route Frequency Ordered Stop   06/24/20 1319  Vancomycin (VANCOCIN) 750-5 MG/150ML-% IVPB       Note to Pharmacy: Judieth Keens  : cabinet override      06/24/20 1319 06/24/20 1323   06/22/20 1200  vancomycin (VANCOCIN) IVPB 750 mg/150 ml premix        750 mg 150 mL/hr over 60 Minutes Intravenous Every T-Th-Sa (Hemodialysis) 06/21/20 1407 06/27/20 0958   06/21/20 1200  vancomycin (VANCOCIN) IVPB 750 mg/150 ml premix  Status:  Discontinued        750 mg 150 mL/hr over 60 Minutes Intravenous Every M-W-F (Hemodialysis) 06/20/20 0904 06/21/20 1407   06/20/20 1200  vancomycin (VANCOCIN) IVPB 750 mg/150 ml premix  Status:  Discontinued        750 mg 150 mL/hr over 60 Minutes Intravenous Every T-Th-Sa (Hemodialysis) 06/18/20 0819 06/19/20 0921   06/19/20 1200  vancomycin (VANCOCIN) IVPB 750 mg/150 ml premix        750 mg 150 mL/hr over 60 Minutes Intravenous Once in dialysis 06/19/20 0921 06/19/20 1657   06/17/20 1200  vancomycin (VANCOCIN) IVPB 500 mg/100 ml premix        500 mg 100 mL/hr over 60 Minutes Intravenous  Once 06/17/20 0811 06/17/20 1333   06/16/20 1200  vancomycin (VANCOCIN)  IVPB 750 mg/150 ml premix        750 mg 150 mL/hr over 60 Minutes Intravenous  Once 06/15/20 1838 06/16/20 1626   06/13/20 1200  vancomycin (VANCOREADY) IVPB 750 mg/150 mL  Status:  Discontinued        750 mg 150 mL/hr over 60 Minutes Intravenous Every T-Th-Sa (Hemodialysis) 06/12/20 1202 06/17/20 0811   06/12/20 1245  vancomycin (VANCOREADY) IVPB 1750 mg/350 mL        1,750 mg 175 mL/hr over 120 Minutes Intravenous  Once 06/12/20 1158 06/12/20 1552   06/12/20 1000  remdesivir 100 mg in sodium chloride 0.9 % 100 mL IVPB       "Followed by" Linked Group Details   100 mg 200 mL/hr over 30 Minutes Intravenous  Daily 06/11/20 1719 06/15/20 0902   06/11/20 2200  remdesivir 200 mg in sodium chloride 0.9% 250 mL IVPB       "Followed by" Linked Group Details   200 mg 580 mL/hr over 30 Minutes Intravenous Once 06/11/20 1719 06/12/20 0430       DVT prophylaxis: Eliquis  Code Status: Full code  Family Communication: No family at bedside   Consultants:  Nephrology  Procedures:      Objective   Vitals:   07/09/20 1005 07/09/20 1646 07/09/20 2014 07/10/20 0547  BP: 116/67 124/76 122/67 126/73  Pulse: (!) 105 (!) 104 (!) 115 98  Resp: 16 18 18 18   Temp: 98.4 F (36.9 C) 98.7 F (37.1 C) 99.6 F (37.6 C) 98.5 F (36.9 C)  TempSrc: Oral Oral Oral   SpO2: 97% 93% 95% 96%  Weight:      Height:        Intake/Output Summary (Last 24 hours) at 07/10/2020 1050 Last data filed at 07/10/2020 0800 Gross per 24 hour  Intake 1137 ml  Output 0 ml  Net 1137 ml    02/19 1901 - 02/21 0700 In: 3202 [P.O.:1374] Out: 0   Filed Weights   07/08/20 1305 07/08/20 1620 07/08/20 2011  Weight: 68.3 kg 65.5 kg 65.5 kg    Physical Examination:   General-appears in no acute distress  Heart-S1-S2, irregular, no murmur auscultated  Lungs-clear to auscultation bilaterally, no wheezing or crackles auscultated  Abdomen-soft, nontender, no organomegaly  Extremities-no edema in the lower  extremities  Neuro-alert, oriented x3, no focal deficit noted   Status is: Inpatient  Dispo: The patient is from: Home              Anticipated d/c is to: Skilled nursing facility              Anticipated d/c date is: 07/13/2020              Patient currently stable for discharge  Barrier to discharge-awaiting bed at skilled nursing facility  Pressure Injury 01/18/20 Sacrum Stage 2 -  Partial thickness loss of dermis presenting as a shallow open injury with a red, pink wound bed without slough. (Active)  01/18/20 2251  Location: Sacrum  Location Orientation:   Staging: Stage 2 -  Partial thickness loss of dermis presenting as a shallow open injury with a red, pink wound bed without slough.  Wound Description (Comments):   Present on Admission: Yes           Data Reviewed:   No results found for this or any previous visit (from the past 240 hour(s)).  No results for input(s): LIPASE, AMYLASE in the last 168 hours. No results for input(s): AMMONIA in the last 168 hours.  Cardiac Enzymes: No results for input(s): CKTOTAL, CKMB, CKMBINDEX, TROPONINI in the last 168 hours. BNP (last 3 results) Recent Labs    10/12/19 0011 06/11/20 1326  BNP 1,980.9* >4,500.0*    ProBNP (last 3 results) No results for input(s): PROBNP in the last 8760 hours.  Studies:  No results found.     Oswald Hillock   [facility] If 7PM-7AM, please contact night-coverage at [email or web address], Office  [phone]   07/10/2020, 10:50 AM  LOS: 29 days

## 2020-07-10 NOTE — TOC Progression Note (Signed)
Transition of Care [facility]) - Progression Note    Patient Details  Name: RAIF CHACHERE MRN: [id number] Date of Birth: 1956/04/24  Transition of Care [facility]) CM/SW Contact  Sharlet Salina Mila Homer, LCSW Phone Number: 07/10/2020, 4:33 PM  Clinical Narrative:  Visited with patient to follow-up regarding the [employer]. CSW and patient revisited information previously shared by patient to ensure CSW had information correct.  Mr. Katich stopped working on his job at [employer] last summer and his insurance while working was with Christella Scheuermann, which was in effect since January 2016. He stopped working last summer and in August and September 2021 he was in the hospital and was covered by [facility] at his request.  Mr. Forst added that the was told that he would not be charged for the COBRA benefits for the 2 months - August and September. He was also informed that he would receive a continuation of benefits form from [facility] and to not send the form in and COBRA benefits would end; however this has not happened per Mr. Chagoya. In his contacts with his former employer, they don't know who is paying the COBRA benefits, as they remain active and he is not paying for them.    Mr. Wernick called personnel again while CSW was in the room and left a voice mail. CSW will continue to follow.              Expected Discharge Plan: Skilled Nursing Facility Barriers to Discharge: Continued Medical Work up  Expected Discharge Plan and Services Expected Discharge Plan: [place] In-house Referral: NA Discharge Planning Services: CM Consult Post Acute Care Choice: [place] Living arrangements for the past 2 months: Single Family Home                 DME Arranged: N/A DME Agency: NA       HH Arranged: NA HH Agency: NA         Social Determinants of Health (SDOH) Interventions    Readmission Risk Interventions Readmission Risk Prevention Plan 01/18/2020 04/26/2019 11/04/2018   Transportation Screening Complete Complete Complete  Medication Review Press photographer) Complete Complete Complete  PCP or Specialist appointment within 3-5 days of discharge Complete Not Complete Complete  PCP/Specialist Appt Not Complete comments - pending medical stability -  [place] or Home Care Consult Complete Complete Complete  SW Recovery Care/Counseling Consult Complete Complete Complete  Palliative Care Screening Not Applicable Not Complete Not Applicable  Comments - may be appropriate, will ask MD -  [place] Not Applicable Patient Refused Not Applicable  Some recent data might be hidden

## 2020-07-10 NOTE — Progress Notes (Signed)
Hypoglycemic Event  CBG: 64  1155  Treatment: Gave apple juice   Symptoms: no symptoms  Follow-up CBG: Time:1224 CBG Result:226  CBG Time 1340   CBG 134  Possible Reasons for Event:was given meal coverage dose after breakfast. Ate all breakfast Comments/MD notified: yes     Jahnae Mcadoo Carlisle Beers

## 2020-07-10 NOTE — Plan of Care (Signed)
  Problem: Health Behavior/Discharge Planning: Goal: Ability to manage health-related needs will improve Outcome: Progressing   Problem: Education: Goal: Understanding of medication regimen will improve Outcome: Progressing

## 2020-07-10 NOTE — Progress Notes (Addendum)
[facility] Progress Note   Subjective:    Seen at bedside. Comfortable. No overnight events.  No cp, sob.    Objective Vitals:   07/09/20 1005 07/09/20 1646 07/09/20 2014 07/10/20 0547  BP: 116/67 124/76 122/67 126/73  Pulse: (!) 105 (!) 104 (!) 115 98  Resp: 16 18 18 18   Temp: 98.4 F (36.9 C) 98.7 F (37.1 C) 99.6 F (37.6 C) 98.5 F (36.9 C)  TempSrc: Oral Oral Oral   SpO2: 97% 93% 95% 96%  Weight:      Height:       Physical Exam General:Appears comfortable, in no acute distress Heart:Tachy but regular; S1 and S2. Lungs:On RA; Lungs clear throughout w/o wheezing, rhonchi, and rales Abdomen:Soft, round, non-tender, non-distended, active bowel sounds Extremities:No edema bilateral lower extremities Dialysis Access:R AVF (+) Bruit/Thrill   Filed Weights   07/08/20 1305 07/08/20 1620 07/08/20 2011  Weight: 68.3 kg 65.5 kg 65.5 kg    Intake/Output Summary (Last 24 hours) at 07/10/2020 0915 Last data filed at 07/10/2020 0800 Gross per 24 hour  Intake 1374 ml  Output 0 ml  Net 1374 ml    Additional Objective Labs: Basic Metabolic Panel: Recent Labs  Lab 07/04/20 1748 07/06/20 1637 07/08/20 1431  NA 138 135 136  K 4.1 3.6 4.4  CL 101 99 97*  CO2 26 27 25   GLUCOSE 151* 90 267*  BUN 55* 39* 80*  CREATININE 3.70* 3.03* 4.83*  CALCIUM 7.8* 8.0* 7.9*  PHOS 4.1 3.3 5.1*   Liver Function Tests: Recent Labs  Lab 07/04/20 1748 07/06/20 1637 07/08/20 1431  ALBUMIN 1.9* 1.9* 1.8*   No results for input(s): LIPASE, AMYLASE in the last 168 hours. CBC: Recent Labs  Lab 07/04/20 1748 07/06/20 1637 07/08/20 1431  WBC 7.7 5.9 5.4  HGB 10.4* 10.4* 8.6*  HCT 34.3* 33.8* 28.7*  MCV 91.7 90.1 92.3  PLT 65* 103* 144*   Blood Culture    Component Value Date/Time   SDES BLOOD LEFT HAND 06/13/2020 0803   SDES BLOOD LEFT HAND 06/13/2020 0803   SPECREQUEST  06/13/2020 0803    BOTTLES DRAWN AEROBIC AND ANAEROBIC Blood Culture adequate  volume   SPECREQUEST  06/13/2020 0803    BOTTLES DRAWN AEROBIC AND ANAEROBIC Blood Culture adequate volume   CULT  06/13/2020 0803    NO GROWTH 5 DAYS Performed at [facility] [address], [place], [place] [address]    CULT  06/13/2020 0803    NO GROWTH 5 DAYS Performed at [facility] [address], [place], [place] [address]    REPTSTATUS 06/18/2020 FINAL 06/13/2020 0803   REPTSTATUS 06/18/2020 FINAL 06/13/2020 0803    CBG: Recent Labs  Lab 07/09/20 0631 07/09/20 1125 07/09/20 1649 07/09/20 2123 07/10/20 0653  GLUCAP 236* 271* 152* 153* 78   Iron Studies: No results for input(s): IRON, TIBC, TRANSFERRIN, FERRITIN in the last 72 hours. Lab Results  Component Value Date   INR 1.2 01/04/2020   INR 1.2 10/28/2018   INR 1.25 06/14/2013    Medications: . sodium chloride     . apixaban  5 mg Oral BID  . aspirin  81 mg Oral Daily  . atorvastatin  10 mg Oral Q M,W,F  . Chlorhexidine Gluconate Cloth  6 each Topical Q0600  . [START ON 07/11/2020] darbepoetin (ARANESP) injection - DIALYSIS  60 mcg Intravenous Q Tue-HD  . doxercalciferol  2 mcg Intravenous Q T,Th,Sa-HD  . feeding supplement (NEPRO CARB STEADY)  237 mL Oral TID WC  . insulin aspart  0-15 Units Subcutaneous TID WC  . insulin aspart  0-5 Units Subcutaneous QHS  . insulin aspart  3 Units Subcutaneous TID WC  . insulin glargine  5 Units Subcutaneous Daily  . isosorbide mononitrate  15 mg Oral Daily  . lipase/protease/amylase  12,000 Units Oral TID AC  . magnesium chloride  2 tablet Oral BID  . metoprolol succinate  50 mg Oral Daily  . pantoprazole  80 mg Oral Daily  . predniSONE  5 mg Oral Q breakfast  . sertraline  50 mg Oral QHS  . sevelamer carbonate  800 mg Oral TID WC  . sodium chloride flush  3 mL Intravenous Q12H  . tacrolimus  2 mg Oral BID  . tamsulosin  0.4 mg Oral QPC supper   Dialysis Orders: [facility] TTS 3.5h EDW 66kg 3K/2.5 Ca AVF No heparin  No ESA Hectorol   3  Assessment/Plan: 1. Sepsis 2/2 Enterococcus bacteremia. Resolved. S/p IV antibiotics course. Echo neg for veg.  2. COVID-19/PNA: Tested (+) 06/11/20 currently out of isolation window.  3 ESRD: HD TTS. Next HD 2/22.  4. Anemiaof CKD: Hgb 8.6 . Resume ESA --Will order Aranesp 60 with HD 2/22  5. Secondary hyperparathyroidism- Ca/Phos at goal. Continue VDRA and binders. 6.HTN/volume- BP controlled. No signs for volume overload at this time.  Continue Hydralazine, Metoprolol, and Imdur. 7. Nutrition- Continue renal diet; Albumin 1.9, continue Nepro supplement for low albumin  8. Tachy/brady syndrome - H/o AFib/RVR. - On metoprolol, Eliquis.  Not symptomatic. Monitor.  9. Dispo: Awaiting inpatient rehab    Lynnda Child PA-C [facility] 07/10/2020,9:15 AM

## 2020-07-10 NOTE — Progress Notes (Signed)
Physical Therapy Treatment Patient Details Name: Paul [name] MRN: [id number] DOB: 1956-03-19 Today's Date: 07/10/2020    History of Present Illness 65 yo male presenting to ED via EMS after missing dialysis and weakness with COVID-19 +. Off COVID precautions on 2/3. PMH including HTN, DM2, renal transplant, CKD st IV, calciphylaxis, A-Fib, bil. TTAs, bilateral BKA with prosthesis,and TIA.    PT Comments    Pt received in bed, reporting that he is tired but willing to participate in bed exercises. 2/10 chest pain still present but cleared by medical team for bed level exercises. Pt demonstrating A-fib rhythm before bed exercises with HR 110-130s. Started with LAQs in bed to address LE strengthening. HR fluctuating between 135-151 bpm. Deferred additional exercises, bed mobility, and OOB mobility due to increased HR and f-fib rhythm. Pt left in bed with all needs met and call bell within reach. Will continue to follow acutely.    Follow Up Recommendations  SNF;Supervision for mobility/OOB     Equipment Recommendations  Rolling walker with 5" wheels;3in1 (PT);Wheelchair (measurements PT);Wheelchair cushion (measurements PT) (TBD)    Recommendations for Other Services       Precautions / Restrictions Precautions Precautions: Fall;Other (comment) Precaution Comments: B BKA with prosthetics in room now, mod fall Restrictions Weight Bearing Restrictions: No    Mobility  Bed Mobility               General bed mobility comments: Deferred    Transfers                 General transfer comment: Deferred  Ambulation/Gait             General Gait Details: Deferred   Stairs             Wheelchair Mobility    Modified Rankin (Stroke Patients Only)       Balance Overall balance assessment: Needs assistance Sitting-balance support: Feet unsupported;Single extremity supported Sitting balance-Leahy Scale: Poor     Standing balance support: Bilateral  upper extremity supported Standing balance-Leahy Scale: Poor Standing balance comment: Reliant on B UE support                            Cognition Arousal/Alertness: Awake/alert Behavior During Therapy: WFL for tasks assessed/performed Overall Cognitive Status: No family/caregiver present to determine baseline cognitive functioning Area of Impairment: Problem solving;Safety/judgement                   Current Attention Level: Sustained     Safety/Judgement: Decreased awareness of safety;Decreased awareness of deficits Awareness: Emergent Problem Solving: Slow processing;Requires verbal cues        Exercises Other Exercises Other Exercises: LAQs in bed, 10 B (Rest break needed between sides, and after 8 on R side to let HR decrease. HR around 110-130s after 10 reps on L side, increased to 130-150s after 8 reps on R side)    General Comments General comments (skin integrity, edema, etc.): HR between 135-150 after LAQ in bed. Deferred additional exercises, bed mobility, and OOB mobility due to a-fib rhythm and increased HR      Pertinent Vitals/Pain Pain Assessment: 0-10 Pain Score: 2  Pain Location: Chest pain Pain Descriptors / Indicators: Discomfort;Sore Pain Intervention(s): Monitored during session    Home Living                      Prior Function  PT Goals (current goals can now be found in the care plan section) Acute Rehab PT Goals Patient Stated Goal: get to chair    Frequency    Min 2X/week      PT Plan Discharge plan needs to be updated    Co-evaluation              AM-PAC PT "6 Clicks" Mobility   Outcome Measure  Help needed turning from your back to your side while in a flat bed without using bedrails?: None Help needed moving from lying on your back to sitting on the side of a flat bed without using bedrails?: A Lot Help needed moving to and from a bed to a chair (including a wheelchair)?: A  Lot Help needed standing up from a chair using your arms (e.g., wheelchair or bedside chair)?: A Lot Help needed to walk in hospital room?: A Lot Help needed climbing 3-5 steps with a railing? : A Lot 6 Click Score: 14    End of Session   Activity Tolerance: Patient tolerated treatment well;Treatment limited secondary to medical complications (Comment) Patient left: with call bell/phone within reach;in bed Nurse Communication: Mobility status PT Visit Diagnosis: Muscle weakness (generalized) (M62.81);Other abnormalities of gait and mobility (R26.89)     Time:  -     Charges:                        Rosita Kea, SPT

## 2020-07-10 NOTE — Progress Notes (Signed)
Secure chat provider, central telemetry called elevated Heart Rate, Physical Therapy had been in room previously.  Heart rate now 120s

## 2020-07-11 DIAGNOSIS — B952 Enterococcus as the cause of diseases classified elsewhere: Secondary | ICD-10-CM | POA: Diagnosis not present

## 2020-07-11 DIAGNOSIS — E1159 Type 2 diabetes mellitus with other circulatory complications: Secondary | ICD-10-CM | POA: Diagnosis not present

## 2020-07-11 DIAGNOSIS — U071 COVID-19: Secondary | ICD-10-CM | POA: Diagnosis not present

## 2020-07-11 DIAGNOSIS — R7881 Bacteremia: Secondary | ICD-10-CM | POA: Diagnosis not present

## 2020-07-11 LAB — RENAL FUNCTION PANEL
Albumin: 1.7 g/dL — ABNORMAL LOW (ref 3.5–5.0)
Anion gap: 14 (ref 5–15)
BUN: 106 mg/dL — ABNORMAL HIGH (ref 8–23)
CO2: 21 mmol/L — ABNORMAL LOW (ref 22–32)
Calcium: 8.1 mg/dL — ABNORMAL LOW (ref 8.9–10.3)
Chloride: 99 mmol/L (ref 98–111)
Creatinine, Ser: 5.61 mg/dL — ABNORMAL HIGH (ref 0.61–1.24)
GFR, Estimated: 10 mL/min — ABNORMAL LOW (ref >60)
Glucose, Bld: 98 mg/dL (ref 70–99)
Phosphorus: 5.9 mg/dL — ABNORMAL HIGH (ref 2.5–4.6)
Potassium: 4.5 mmol/L (ref 3.5–5.1)
Sodium: 134 mmol/L — ABNORMAL LOW (ref 135–145)

## 2020-07-11 LAB — CBC
HCT: 28.6 % — ABNORMAL LOW (ref 39.0–52.0)
Hemoglobin: 8.6 g/dL — ABNORMAL LOW (ref 13.0–17.0)
MCH: 27.7 pg (ref 26.0–34.0)
MCHC: 30.1 g/dL (ref 30.0–36.0)
MCV: 92.3 fL (ref 80.0–100.0)
Platelets: 264 10*3/uL (ref 150–400)
RBC: 3.1 MIL/uL — ABNORMAL LOW (ref 4.22–5.81)
RDW: 17.8 % — ABNORMAL HIGH (ref 11.5–15.5)
WBC: 6 10*3/uL (ref 4.0–10.5)
nRBC: 0 % (ref 0.0–0.2)

## 2020-07-11 LAB — GLUCOSE, CAPILLARY
Glucose-Capillary: 86 mg/dL (ref 70–99)
Glucose-Capillary: 85 mg/dL (ref 70–99)
Glucose-Capillary: 170 mg/dL — ABNORMAL HIGH (ref 70–99)
Glucose-Capillary: 99 mg/dL (ref 70–99)

## 2020-07-11 NOTE — Plan of Care (Signed)
  Problem: Activity: Goal: Risk for activity intolerance will decrease Outcome: Progressing   Problem: Education: Goal: Understanding of medication regimen will improve Outcome: Progressing

## 2020-07-11 NOTE — Progress Notes (Signed)
[facility]  PROGRESS NOTE  Paul [name] LJQ:[id number] DOB: Aug 09, 1955 DOA: 06/11/2020 PCP: [name], MD   Brief HPI:   65 year old male with past medical history of atrial fibrillation on Eliquis, ESRD with failed renal transplant, on dialysis, TTS, diabetes mellitus type 2, chronic diastolic heart failure came to ED with complaints of dyspnea in setting of missed dialysis.  Was found to have Covid positive, also Enterococcus bacteremia.  2D echo was done which showed severely reduced EF.  CIR evaluation done, patient has been accepted for CIR but no beds available.    Subjective    Patient seen and examined, denies any palpitations.  No chest pain.  Heart rate is controlled with increased dose of metoprolol.   Assessment/Plan:     1. Acute respiratory failure with hypoxemia-secondary to fluid overload, resolved with hemodialysis. 2. Sepsis due to bacteremia/Enterococcus-patient had Enterococcus bacteremia likely urinary source.  2D echo showed no evidence of vegetation.  Dr. Olevia Bowens discussed with ID who recommended 2 weeks of IV antibiotics.  Antibiotics course has been completed in the hospital.   3. Acute metabolic encephalopathy-resolved likely from sepsis as above. 4. COVID-19 pneumonia-he was treated with remdesivir for 5 days, steroids weaned off.  Patient has completed isolation.  Symptoms have resolved. 5. ESRD on hemodialysis, failed renal transplant.  Patient on hemodialysis Tuesday Thursday Saturday.  Continue sevelamer, sodium bicarbonate, tacrolimus.  Nephrology following. 6. Diabetes mellitus type 2-blood glucose has been labile in the hospital, patient having episodes of hypoglycemia and then also having episodes of hypoglycemia.  Due to hypoglycemia, sliding scale insulin was changed to very sensitive sliding scale.  Also meal coverage was changed to 2 units 3 times daily with meals.  Continue Lantus 5 units subcu daily.  We will continue to monitor patient's  blood glucose closely in the hospital and adjust insulin as needed.     7. Atrial flutter-went into atrial flutter with rapid rate   Heart rate is controlled this morning.  Patient is on increased dose of metoprolol  50 mg daily.  Hydralazine was discontinued yesterday due to tachycardia.  Continue anticoagulation with Eliquis.  Patient was seen by cardiology for episodes of tachycardia with hemodialysis.  EKG showed atrial flutter with variable block.  Initially was started on IV Cardizem but as patient converted to normal sinus rhythm Cardizem was stopped.  8. Dysphagia-speech therapy was consulted, patient is on regular consistency diet. 9. Depression-continue Zoloft 10. Hyperlipidemia-continue Lipitor 11. Hypertension-blood pressure controlled on metoprolol, Imdur.  Hydralazine was discontinued due to tachycardia.     COVID-19 Labs  No results for input(s): DDIMER, FERRITIN, LDH, CRP in the last 72 hours.  Lab Results  Component Value Date   SARSCOV2NAA POSITIVE (A) 06/11/2020   [place] NEGATIVE 12/31/2019   [place] NEGATIVE 10/12/2019   [place] NEGATIVE 04/23/2019     Scheduled medications:   . apixaban  5 mg Oral BID  . aspirin  81 mg Oral Daily  . atorvastatin  10 mg Oral Q M,W,F  . Chlorhexidine Gluconate Cloth  6 each Topical Q0600  . darbepoetin (ARANESP) injection - DIALYSIS  60 mcg Intravenous Q Tue-HD  . doxercalciferol  2 mcg Intravenous Q T,Th,Sa-HD  . feeding supplement (NEPRO CARB STEADY)  237 mL Oral TID WC  . insulin aspart  0-6 Units Subcutaneous TID WC  . insulin aspart  2 Units Subcutaneous TID WC  . insulin glargine  5 Units Subcutaneous Daily  . isosorbide mononitrate  15 mg Oral Daily  .  lipase/protease/amylase  12,000 Units Oral TID AC  . magnesium chloride  2 tablet Oral BID  . metoprolol succinate  50 mg Oral Daily  . pantoprazole  80 mg Oral Daily  . predniSONE  5 mg Oral Q breakfast  . sertraline  50 mg Oral QHS  . sevelamer carbonate   800 mg Oral TID WC  . sodium chloride flush  3 mL Intravenous Q12H  . tacrolimus  2 mg Oral BID  . tamsulosin  0.4 mg Oral QPC supper         CBG: Recent Labs  Lab 07/10/20 1327 07/10/20 1646 07/10/20 2211 07/11/20 0728 07/11/20 1213  GLUCAP 134* 233* 120* 86 85    SpO2: 96 % O2 Flow Rate (L/min): 1 L/min    CBC: Recent Labs  Lab 07/04/20 1748 07/06/20 1637 07/08/20 1431  WBC 7.7 5.9 5.4  HGB 10.4* 10.4* 8.6*  HCT 34.3* 33.8* 28.7*  MCV 91.7 90.1 92.3  PLT 65* 103* 144*    Basic Metabolic Panel: Recent Labs  Lab 07/04/20 1748 07/06/20 1637 07/08/20 1431  NA 138 135 136  K 4.1 3.6 4.4  CL 101 99 97*  CO2 26 27 25   GLUCOSE 151* 90 267*  BUN 55* 39* 80*  CREATININE 3.70* 3.03* 4.83*  CALCIUM 7.8* 8.0* 7.9*  PHOS 4.1 3.3 5.1*     Liver Function Tests: Recent Labs  Lab 07/04/20 1748 07/06/20 1637 07/08/20 1431  ALBUMIN 1.9* 1.9* 1.8*     Antibiotics: Anti-infectives (From admission, onward)   Start     Dose/Rate Route Frequency Ordered Stop   06/24/20 1319  Vancomycin (VANCOCIN) 750-5 MG/150ML-% IVPB       Note to Pharmacy: Judieth Keens  : cabinet override      06/24/20 1319 06/24/20 1323   06/22/20 1200  vancomycin (VANCOCIN) IVPB 750 mg/150 ml premix        750 mg 150 mL/hr over 60 Minutes Intravenous Every T-Th-Sa (Hemodialysis) 06/21/20 1407 06/27/20 0958   06/21/20 1200  vancomycin (VANCOCIN) IVPB 750 mg/150 ml premix  Status:  Discontinued        750 mg 150 mL/hr over 60 Minutes Intravenous Every M-W-F (Hemodialysis) 06/20/20 0904 06/21/20 1407   06/20/20 1200  vancomycin (VANCOCIN) IVPB 750 mg/150 ml premix  Status:  Discontinued        750 mg 150 mL/hr over 60 Minutes Intravenous Every T-Th-Sa (Hemodialysis) 06/18/20 0819 06/19/20 0921   06/19/20 1200  vancomycin (VANCOCIN) IVPB 750 mg/150 ml premix        750 mg 150 mL/hr over 60 Minutes Intravenous Once in dialysis 06/19/20 0921 06/19/20 1657   06/17/20 1200  vancomycin  (VANCOCIN) IVPB 500 mg/100 ml premix        500 mg 100 mL/hr over 60 Minutes Intravenous  Once 06/17/20 0811 06/17/20 1333   06/16/20 1200  vancomycin (VANCOCIN) IVPB 750 mg/150 ml premix        750 mg 150 mL/hr over 60 Minutes Intravenous  Once 06/15/20 1838 06/16/20 1626   06/13/20 1200  vancomycin (VANCOREADY) IVPB 750 mg/150 mL  Status:  Discontinued        750 mg 150 mL/hr over 60 Minutes Intravenous Every T-Th-Sa (Hemodialysis) 06/12/20 1202 06/17/20 0811   06/12/20 1245  vancomycin (VANCOREADY) IVPB 1750 mg/350 mL        1,750 mg 175 mL/hr over 120 Minutes Intravenous  Once 06/12/20 1158 06/12/20 1552   06/12/20 1000  remdesivir 100 mg in sodium chloride 0.9 %  100 mL IVPB       "Followed by" Linked Group Details   100 mg 200 mL/hr over 30 Minutes Intravenous Daily 06/11/20 1719 06/15/20 0902   06/11/20 2200  remdesivir 200 mg in sodium chloride 0.9% 250 mL IVPB       "Followed by" Linked Group Details   200 mg 580 mL/hr over 30 Minutes Intravenous Once 06/11/20 1719 06/12/20 0430       DVT prophylaxis: Eliquis  Code Status: Full code  Family Communication: No family at bedside   Consultants:  Nephrology  Procedures:      Objective   Vitals:   07/10/20 1844 07/10/20 1946 07/11/20 0353 07/11/20 1056  BP: 125/70 119/81 129/73 107/68  Pulse: 93 (!) 45 86 90  Resp: 17 17 18 17   Temp: 97.8 F (36.6 C) 98.1 F (36.7 C) 98.5 F (36.9 C) 98.2 F (36.8 C)  TempSrc:  Oral Oral   SpO2: 95% 97% 91% 96%  Weight:      Height:        Intake/Output Summary (Last 24 hours) at 07/11/2020 1321 Last data filed at 07/10/2020 1943 Gross per 24 hour  Intake 633 ml  Output 50 ml  Net 583 ml    02/20 1901 - 02/22 0700 In: 7741 [P.O.:1470; I.V.:3] Out: 50 [Urine:50]  Filed Weights   07/08/20 1305 07/08/20 1620 07/08/20 2011  Weight: 68.3 kg 65.5 kg 65.5 kg    Physical Examination:   General-appears in no acute distress  Heart-S1-S2, regular, no murmur  auscultated  Lungs-clear to auscultation bilaterally, no wheezing or crackles auscultated  Abdomen-soft, nontender, no organomegaly  Extremities-bilateral BKA  Neuro-alert, oriented x3, no focal deficit noted   Status is: Inpatient  Dispo: The patient is from: Home              Anticipated d/c is to: Skilled nursing facility              Anticipated d/c date is: 07/13/2020              Patient currently stable for discharge  Barrier to discharge-awaiting bed at skilled nursing facility  Pressure Injury 01/18/20 Sacrum Stage 2 -  Partial thickness loss of dermis presenting as a shallow open injury with a red, pink wound bed without slough. (Active)  01/18/20 2251  Location: Sacrum  Location Orientation:   Staging: Stage 2 -  Partial thickness loss of dermis presenting as a shallow open injury with a red, pink wound bed without slough.  Wound Description (Comments):   Present on Admission: Yes    BNP (last 3 results) Recent Labs    10/12/19 0011 06/11/20 1326  BNP 1,980.9* >4,500.0*     [place]   [facility] If 7PM-7AM, please contact night-coverage at [email or web address], Office  [phone]   07/11/2020, 1:21 PM  LOS: 30 days

## 2020-07-11 NOTE — Progress Notes (Signed)
[facility] Progress Note   Subjective:    Paul [name] seen and examined today at bedside. He has no issues or concerns at this time. Denies SOB, CP, ABD pain, N/V/D. Patient is awaiting placement to SNF.  Objective Vitals:   07/10/20 1844 07/10/20 1946 07/11/20 0353 07/11/20 1056  BP: 125/70 119/81 129/73 107/68  Pulse: 93 (!) 45 86 90  Resp: 17 17 18 17   Temp: 97.8 F (36.6 C) 98.1 F (36.7 C) 98.5 F (36.9 C) 98.2 F (36.8 C)  TempSrc:  Oral Oral   SpO2: 95% 97% 91% 96%  Weight:      Height:       Physical Exam General:Appears comfortable, in no acute distress Heart:Murmur noted; S1 and S2. Lungs:On RA; Lungs clear throughout w/o wheezing, rhonchi, and rales Abdomen:Soft, round, non-tender, non-distended, active bowel sounds Extremities:Noted trace edema L hip, No edema upper extremities or R thigh Dialysis Access:R AVF (+) Bruit/Thrill  Filed Weights   07/08/20 1305 07/08/20 1620 07/08/20 2011  Weight: 68.3 kg 65.5 kg 65.5 kg    Intake/Output Summary (Last 24 hours) at 07/11/2020 1249 Last data filed at 07/10/2020 1943 Gross per 24 hour  Intake 873 ml  Output 50 ml  Net 823 ml    Additional Objective Labs: Basic Metabolic Panel: Recent Labs  Lab 07/04/20 1748 07/06/20 1637 07/08/20 1431  NA 138 135 136  K 4.1 3.6 4.4  CL 101 99 97*  CO2 26 27 25   GLUCOSE 151* 90 267*  BUN 55* 39* 80*  CREATININE 3.70* 3.03* 4.83*  CALCIUM 7.8* 8.0* 7.9*  PHOS 4.1 3.3 5.1*   Liver Function Tests: Recent Labs  Lab 07/04/20 1748 07/06/20 1637 07/08/20 1431  ALBUMIN 1.9* 1.9* 1.8*   No results for input(s): LIPASE, AMYLASE in the last 168 hours. CBC: Recent Labs  Lab 07/04/20 1748 07/06/20 1637 07/08/20 1431  WBC 7.7 5.9 5.4  HGB 10.4* 10.4* 8.6*  HCT 34.3* 33.8* 28.7*  MCV 91.7 90.1 92.3  PLT 65* 103* 144*   Blood Culture    Component Value Date/Time   SDES BLOOD LEFT HAND 06/13/2020 0803   SDES BLOOD LEFT HAND 06/13/2020 0803    SPECREQUEST  06/13/2020 0803    BOTTLES DRAWN AEROBIC AND ANAEROBIC Blood Culture adequate volume   SPECREQUEST  06/13/2020 0803    BOTTLES DRAWN AEROBIC AND ANAEROBIC Blood Culture adequate volume   CULT  06/13/2020 0803    NO GROWTH 5 DAYS Performed at [facility] [address], [place], [place] [address]    CULT  06/13/2020 0803    NO GROWTH 5 DAYS Performed at [facility] [address], [place], [place] [address]    REPTSTATUS 06/18/2020 FINAL 06/13/2020 0803   REPTSTATUS 06/18/2020 FINAL 06/13/2020 0803    CBG: Recent Labs  Lab 07/10/20 1327 07/10/20 1646 07/10/20 2211 07/11/20 0728 07/11/20 1213  GLUCAP 134* 233* 120* 86 85    Lab Results  Component Value Date   INR 1.2 01/04/2020   INR 1.2 10/28/2018   INR 1.25 06/14/2013    Medications: . sodium chloride     . apixaban  5 mg Oral BID  . aspirin  81 mg Oral Daily  . atorvastatin  10 mg Oral Q M,W,F  . Chlorhexidine Gluconate Cloth  6 each Topical Q0600  . darbepoetin (ARANESP) injection - DIALYSIS  60 mcg Intravenous Q Tue-HD  . doxercalciferol  2 mcg Intravenous Q T,Th,Sa-HD  . feeding supplement (NEPRO  CARB STEADY)  237 mL Oral TID WC  . insulin aspart  0-6 Units Subcutaneous TID WC  . insulin aspart  2 Units Subcutaneous TID WC  . insulin glargine  5 Units Subcutaneous Daily  . isosorbide mononitrate  15 mg Oral Daily  . lipase/protease/amylase  12,000 Units Oral TID AC  . magnesium chloride  2 tablet Oral BID  . metoprolol succinate  50 mg Oral Daily  . pantoprazole  80 mg Oral Daily  . predniSONE  5 mg Oral Q breakfast  . sertraline  50 mg Oral QHS  . sevelamer carbonate  800 mg Oral TID WC  . sodium chloride flush  3 mL Intravenous Q12H  . tacrolimus  2 mg Oral BID  . tamsulosin  0.4 mg Oral QPC supper    Dialysis Orders: HD: TTS Duration: 3hrs 2K/2.25Ca/BFR 400/DFR 600 UF goal: 2-2.5L  Assessment/Plan: 1. Sepsis 2/2 Enterococcus bacteremia. Resolved. S/p IV  antibiotics course. Echo neg for veg.  2. COVID-19/PNA: Tested (+) 06/11/20 currently out of isolation window. 3 ESRD: HD TTS. Patient scheduled for HD today. 4. Anemiaof CKD: Hgb 8.6 . Resume ESA --Aranesp 60 ordered with HD scheduled to be administered today 2/22  5. Secondary hyperparathyroidism- Ca/Phos at goal. Continue VDRA and binders. 6.HTN/volume- BP controlled. No signs for volume overload at this time.  Continue Hydralazine, Metoprolol, and Imdur. 7. Nutrition- Continue renal diet; Albumin 1.9,continue Nepro supplement for low albumin  8. Tachy/brady syndrome - H/o AFib/RVR. - On metoprolol, Eliquis.  Not symptomatic. Monitor.  9. Dispo: Awaiting inpatient rehab   Tobie Poet, NP [facility] 07/11/2020,12:49 PM  LOS: 30 days

## 2020-07-12 DIAGNOSIS — B952 Enterococcus as the cause of diseases classified elsewhere: Secondary | ICD-10-CM | POA: Diagnosis not present

## 2020-07-12 DIAGNOSIS — R7881 Bacteremia: Secondary | ICD-10-CM | POA: Diagnosis not present

## 2020-07-12 LAB — GLUCOSE, CAPILLARY
Glucose-Capillary: 114 mg/dL — ABNORMAL HIGH (ref 70–99)
Glucose-Capillary: 53 mg/dL — ABNORMAL LOW (ref 70–99)
Glucose-Capillary: 234 mg/dL — ABNORMAL HIGH (ref 70–99)
Glucose-Capillary: 243 mg/dL — ABNORMAL HIGH (ref 70–99)
Glucose-Capillary: 343 mg/dL — ABNORMAL HIGH (ref 70–99)
Glucose-Capillary: 347 mg/dL — ABNORMAL HIGH (ref 70–99)

## 2020-07-12 MED ORDER — LIDOCAINE HCL (PF) 1 % IJ SOLN: 5.0000 mL | INTRAMUSCULAR | Status: DC | PRN

## 2020-07-12 MED ORDER — PENTAFLUOROPROP-TETRAFLUOROETH EX AERO: 1.0000 "application " | INHALATION_SPRAY | CUTANEOUS | Status: DC | PRN

## 2020-07-12 MED ORDER — HEPARIN SODIUM (PORCINE) 1000 UNIT/ML DIALYSIS: 1000.0000 [IU] | INTRAMUSCULAR | Status: DC | PRN

## 2020-07-12 MED ORDER — SODIUM CHLORIDE 0.9 % IV SOLN: 100.0000 mL | INTRAVENOUS | Status: DC | PRN

## 2020-07-12 MED ORDER — ALTEPLASE 2 MG IJ SOLR: 2.0000 mg | Freq: Once | INTRAMUSCULAR | Status: DC | PRN

## 2020-07-12 MED ORDER — LIDOCAINE-PRILOCAINE 2.5-2.5 % EX CREA: 1.0000 "application " | TOPICAL_CREAM | CUTANEOUS | Status: DC | PRN

## 2020-07-12 NOTE — Progress Notes (Signed)
PROGRESS NOTE  Paul [name]  DOB: 1956/01/14  PCP: [name], MD YQI:[id number]  DOA: 06/11/2020  LOS: 31 days   Chief Complaint  Patient presents with  . Weakness  . Shortness of Breath  . Tachycardia   Brief narrative: Paul [name] is a 65 y.o. male with PMH significant for atrial fibrillation on Eliquis, ESRD with failed renal transplant, on dialysis, TTS, diabetes mellitus type 2, chronic diastolic heart failure. Patient presented to the ED on 06/11/2020 with complaints of dyspnea in setting of missed dialysis.   During the course of hospitalization, he was treated for Covid pneumonia, Enterococcus bacteremia.   2D echo showed severely reduced EF.   CIR evaluation was done, patient has been accepted for CIR but no beds available.  Subjective: Patient was seen and examined this morning. Pleasant elderly male.  Not in distress.  No new symptoms.  Assessment/Plan: Acute respiratory failure with hypoxemia -secondary to fluid overload, resolved with hemodialysis.  Sepsis due to bacteremia/Enterococcus -patient had Enterococcus bacteremia likely urinary source.   -2D echo showed no evidence of vegetation.   -Previous hospitalist Dr. Olevia Bowens discussed with ID who recommended 2 weeks of IV antibiotics.  Antibiotics course has been completed in the hospital.    Essential hypertension -Currently on Toprol 50 mg daily, Imdur 15 mg daily.  Blood pressure in low normal range but heart rate increases on minimal exertion.  I would stop Imdur to make some room for escalation of beta-blocker. -Continue to monitor blood pressure Atrial flutter -Was in RVR during this hospital stay.  Controlled with escalation in the dose of beta-blocker.  Per physical therapist, with minimal exertion, patient's heart rate shoots up.  We will switch from Toprol 50 mg daily to Lopressor 37.5 mg twice daily. -Continue Eliquis for anticoagulation  Acute metabolic encephalopathy -Secondary to sepsis,  resolved   COVID-19 pneumonia -Treated with remdesivir 5 days, steroids weaned off.   -Patient has completed isolation. Symptoms have resolved.  ESRD-HD-TTS Chronic metabolic acidosis Failed renal transplant. -Continue immunosuppression with tacrolimus, prednisone.   -Continue Sevelamer, sodium bicarbonate -Nephrology following.  Diabetes mellitus type 2 Hypoglycemia -This morning patient's blood sugar level was low at 53.  He was supposed to be on Lantus 5 units daily which I stopped. It seems that his blood sugar this evening is trending up, at 343.  -Continue sliding scale insulin with Accu-Cheks. -A1c 7.5 on 1/24. Recent Labs  Lab 07/11/20 2056 07/12/20 0739 07/12/20 0824 07/12/20 1356 07/12/20 1702  GLUCAP 99 53* 114* 243* 343*   Hyperlipidemia -continue Lipitor  Depression -continue Zoloft  Mobility: Bilateral above-knee amputation status Code Status:   Code Status: Full Code  Nutritional status: Body mass index is 18.96 kg/m.     Diet Order            Diet renal with fluid restriction Fluid restriction: 1200 mL Fluid; Room service appropriate? Yes; Fluid consistency: Thin  Diet effective now                 DVT prophylaxis:  apixaban (ELIQUIS) tablet 5 mg   Antimicrobials:  None Fluid: None Consultants: Nephrology Family Communication:  Not at bedside  Status is: Inpatient Dispo:  Patient From:    Planned Disposition: [facility]  Expected discharge date: 07/14/2020  Medically stable for discharge:           Infusions:  . sodium chloride    . sodium chloride    . sodium chloride  Scheduled Meds: . apixaban  5 mg Oral BID  . aspirin  81 mg Oral Daily  . atorvastatin  10 mg Oral Q M,W,F  . Chlorhexidine Gluconate Cloth  6 each Topical Q0600  . darbepoetin (ARANESP) injection - DIALYSIS  60 mcg Intravenous Q Tue-HD  . doxercalciferol  2 mcg Intravenous Q T,Th,Sa-HD  . feeding supplement (NEPRO CARB STEADY)  237 mL  Oral TID WC  . insulin aspart  0-6 Units Subcutaneous TID WC  . insulin aspart  2 Units Subcutaneous TID WC  . isosorbide mononitrate  15 mg Oral Daily  . lipase/protease/amylase  12,000 Units Oral TID AC  . magnesium chloride  2 tablet Oral BID  . metoprolol succinate  50 mg Oral Daily  . pantoprazole  80 mg Oral Daily  . predniSONE  5 mg Oral Q breakfast  . sertraline  50 mg Oral QHS  . sevelamer carbonate  800 mg Oral TID WC  . sodium chloride flush  3 mL Intravenous Q12H  . tacrolimus  2 mg Oral BID  . tamsulosin  0.4 mg Oral QPC supper    Antimicrobials: Anti-infectives (From admission, onward)   Start     Dose/Rate Route Frequency Ordered Stop   06/24/20 1319  Vancomycin (VANCOCIN) 750-5 MG/150ML-% IVPB       Note to Pharmacy: Judieth Keens  : cabinet override      06/24/20 1319 06/24/20 1323   06/22/20 1200  vancomycin (VANCOCIN) IVPB 750 mg/150 ml premix        750 mg 150 mL/hr over 60 Minutes Intravenous Every T-Th-Sa (Hemodialysis) 06/21/20 1407 06/27/20 0958   06/21/20 1200  vancomycin (VANCOCIN) IVPB 750 mg/150 ml premix  Status:  Discontinued        750 mg 150 mL/hr over 60 Minutes Intravenous Every M-W-F (Hemodialysis) 06/20/20 0904 06/21/20 1407   06/20/20 1200  vancomycin (VANCOCIN) IVPB 750 mg/150 ml premix  Status:  Discontinued        750 mg 150 mL/hr over 60 Minutes Intravenous Every T-Th-Sa (Hemodialysis) 06/18/20 0819 06/19/20 0921   06/19/20 1200  vancomycin (VANCOCIN) IVPB 750 mg/150 ml premix        750 mg 150 mL/hr over 60 Minutes Intravenous Once in dialysis 06/19/20 0921 06/19/20 1657   06/17/20 1200  vancomycin (VANCOCIN) IVPB 500 mg/100 ml premix        500 mg 100 mL/hr over 60 Minutes Intravenous  Once 06/17/20 0811 06/17/20 1333   06/16/20 1200  vancomycin (VANCOCIN) IVPB 750 mg/150 ml premix        750 mg 150 mL/hr over 60 Minutes Intravenous  Once 06/15/20 1838 06/16/20 1626   06/13/20 1200  vancomycin (VANCOREADY) IVPB 750 mg/150 mL   Status:  Discontinued        750 mg 150 mL/hr over 60 Minutes Intravenous Every T-Th-Sa (Hemodialysis) 06/12/20 1202 06/17/20 0811   06/12/20 1245  vancomycin (VANCOREADY) IVPB 1750 mg/350 mL        1,750 mg 175 mL/hr over 120 Minutes Intravenous  Once 06/12/20 1158 06/12/20 1552   06/12/20 1000  remdesivir 100 mg in sodium chloride 0.9 % 100 mL IVPB       "Followed by" Linked Group Details   100 mg 200 mL/hr over 30 Minutes Intravenous Daily 06/11/20 1719 06/15/20 0902   06/11/20 2200  remdesivir 200 mg in sodium chloride 0.9% 250 mL IVPB       "Followed by" Linked Group Details   200 mg 580 mL/hr over 30  Minutes Intravenous Once 06/11/20 1719 06/12/20 0430      PRN meds: sodium chloride, sodium chloride, sodium chloride, acetaminophen, alteplase, chlorpheniramine-HYDROcodone, cyclobenzaprine, guaiFENesin-dextromethorphan, heparin, Ipratropium-Albuterol, lidocaine (PF), lidocaine-prilocaine, pentafluoroprop-tetrafluoroeth, polyethylene glycol, sodium chloride flush, traMADol   Objective: Vitals:   07/12/20 0414 07/12/20 0947  BP: 111/70 93/64  Pulse: 81 (!) 58  Resp: 16 18  Temp: 98.2 F (36.8 C) 98 F (36.7 C)  SpO2: 94% 96%    Intake/Output Summary (Last 24 hours) at 07/12/2020 1747 Last data filed at 07/12/2020 1221 Gross per 24 hour  Intake 837 ml  Output 2000 ml  Net -1163 ml   Filed Weights   07/11/20 2056 07/11/20 2130 07/12/20 0050  Weight: 66.9 kg 66.9 kg 63.4 kg   Weight change:  Body mass index is 18.96 kg/m.   Physical Exam: General exam: Pleasant elderly Caucasian male.  Not in distress Skin: No rashes, lesions or ulcers. HEENT: Atraumatic, normocephalic, no obvious bleeding Lungs: Clear to auscultation bilaterally CVS: Regular rate and rhythm, no murmur GI/Abd soft, nontender, nondistended, bowel sound present CNS: Alert, awake, oriented x3 Psychiatry: Mood appropriate Extremities: Bilateral amputation status  Data Review: I have personally  reviewed the laboratory data and studies available.  Recent Labs  Lab 07/06/20 1637 07/08/20 1431 07/11/20 2229  WBC 5.9 5.4 6.0  HGB 10.4* 8.6* 8.6*  HCT 33.8* 28.7* 28.6*  MCV 90.1 92.3 92.3  PLT 103* 144* 264   Recent Labs  Lab 07/06/20 1637 07/08/20 1431 07/11/20 2229  NA 135 136 134*  K 3.6 4.4 4.5  CL 99 97* 99  CO2 27 25 21*  GLUCOSE 90 267* 98  BUN 39* 80* 106*  CREATININE 3.03* 4.83* 5.61*  CALCIUM 8.0* 7.9* 8.1*  PHOS 3.3 5.1* 5.9*    F/u labs Unresulted Labs (From admission, onward)         None      Signed, Terrilee Croak, MD [facility] 07/12/2020

## 2020-07-12 NOTE — Progress Notes (Signed)
Physical Therapy Treatment Patient Details Name: Paul [name] MRN: [id number] DOB: Jan 21, 1956 Today's Date: 07/12/2020    History of Present Illness 65 yo male presenting to ED via EMS after missing dialysis and weakness with COVID-19 +. Off COVID precautions on 2/3. PMH including HTN, DM2, renal transplant, CKD st IV, calciphylaxis, A-Fib, bil. TTAs, bilateral BKA with prosthesis,and TIA.    PT Comments    Pt received in bed, reported he is tired from HD last night but willing to participate in PT. Pt still demonstrated A-fib rhythm with HR between 110-130s at baseline, but cleared by medical team to participate in low level PT session. Able to tolerate more bed level exercises today until HR elevated to 130-140s. Deferred additional exercises at this point. Pt wanted to continue and do exercises on his stomach but educated pt reason for finishing PT session. Also educated that he was declined for CIR. Pt agreeable to ST rehab at SNF. Pt demonstrated understanding of all education, was pleasant and cooperative for session. Pt left in bed with all needs met, call bell within reach, bed alarm active, RN present in room, and MD aware of status. Continue to monitor HR/rhythm, hoping to return to OOB mobility as medical status allows.    Follow Up Recommendations  SNF;Supervision for mobility/OOB     Equipment Recommendations  Rolling walker with 5" wheels;3in1 (PT);Wheelchair (measurements PT);Wheelchair cushion (measurements PT) (TBD)    Recommendations for Other Services       Precautions / Restrictions Precautions Precautions: Fall;Other (comment) Precaution Comments: B BKA with prosthetics in room now, mod fall    Mobility  Bed Mobility               General bed mobility comments: Deferred    Transfers                 General transfer comment: Deferred  Ambulation/Gait             General Gait Details: Deferred   Stairs             Wheelchair  Mobility    Modified Rankin (Stroke Patients Only)       Balance Overall balance assessment: Needs assistance Sitting-balance support: Feet unsupported;Single extremity supported Sitting balance-Leahy Scale: Poor     Standing balance support: Bilateral upper extremity supported Standing balance-Leahy Scale: Poor Standing balance comment: Reliant on B UE support                            Cognition Arousal/Alertness: Awake/alert Behavior During Therapy: WFL for tasks assessed/performed Overall Cognitive Status: No family/caregiver present to determine baseline cognitive functioning Area of Impairment: Problem solving;Safety/judgement                   Current Attention Level: Sustained     Safety/Judgement: Decreased awareness of safety;Decreased awareness of deficits Awareness: Emergent Problem Solving: Slow processing;Requires verbal cues        Exercises Other Exercises Other Exercises: SLR, 1x10 B (HR 110-130s) Other Exercises: Hip abduction, 1x10 B (HR 110-130s) Other Exercises: LAQs, 1x10 B (HR 130-140s, stopped PT at this point)    General Comments        Pertinent Vitals/Pain Pain Assessment: 0-10 Pain Score: 2  (0 at rest, 2 "when i move a certain way") Pain Location: Chest pain Pain Descriptors / Indicators: Discomfort;Sore Pain Intervention(s): Monitored during session    Home Living  Prior Function            PT Goals (current goals can now be found in the care plan section) Acute Rehab PT Goals Patient Stated Goal: get to chair    Frequency    Min 2X/week      PT Plan Discharge plan needs to be updated    Co-evaluation              AM-PAC PT "6 Clicks" Mobility   Outcome Measure  Help needed turning from your back to your side while in a flat bed without using bedrails?: None Help needed moving from lying on your back to sitting on the side of a flat bed without using  bedrails?: A Lot Help needed moving to and from a bed to a chair (including a wheelchair)?: A Lot Help needed standing up from a chair using your arms (e.g., wheelchair or bedside chair)?: A Lot Help needed to walk in hospital room?: A Lot Help needed climbing 3-5 steps with a railing? : A Lot 6 Click Score: 14    End of Session Equipment Utilized During Treatment: Gait belt Activity Tolerance: Patient tolerated treatment well;Treatment limited secondary to medical complications (Comment) Patient left: with call bell/phone within reach;in bed;with bed alarm set;with nursing/sitter in room Nurse Communication: Mobility status PT Visit Diagnosis: Muscle weakness (generalized) (M62.81);Other abnormalities of gait and mobility (R26.89)     Time:  -     Charges:                        Rosita Kea, SPT

## 2020-07-12 NOTE — Plan of Care (Signed)
  Problem: Health Behavior/Discharge Planning: Goal: Ability to manage health-related needs will improve Outcome: Progressing   Problem: Activity: Goal: Ability to tolerate increased activity will improve Outcome: Progressing   

## 2020-07-12 NOTE — Progress Notes (Signed)
[facility] Progress Note   Subjective:    Mr. Hausman seen and examined today at bedside. He has no issues or concerns at this time. Denies SOB, CP, ABD pain, N/V/D. Patient is awaiting placement to SNF.  Objective Vitals:   07/12/20 0050 07/12/20 0149 07/12/20 0414 07/12/20 0947  BP: 110/80 124/74 111/70 93/64  Pulse: (!) 114 78 81 (!) 58  Resp: 19 18 16 18   Temp: 98.4 F (36.9 C) 98.2 F (36.8 C) 98.2 F (36.8 C) 98 F (36.7 C)  TempSrc: Oral Oral Oral   SpO2: 97% 98% 94% 96%  Weight: 63.4 kg     Height:       Physical Exam General:Appears comfortable, in no acute distress Heart: S1 and S2.; No gallops or friction rub Lungs:On RA; Lungs clear throughout w/o wheezing, rhonchi, and rales Abdomen:Soft, round, non-tender, non-distended, active bowel sounds Extremities:Noted trace edema L hip, No edema upper extremities or R thigh Dialysis Access:R AVF (+) Bruit/Thrill  Filed Weights   07/11/20 2056 07/11/20 2130 07/12/20 0050  Weight: 66.9 kg 66.9 kg 63.4 kg    Intake/Output Summary (Last 24 hours) at 07/12/2020 1547 Last data filed at 07/12/2020 1221 Gross per 24 hour  Intake 1107 ml  Output 2000 ml  Net -893 ml    Additional Objective Labs: Basic Metabolic Panel: Recent Labs  Lab 07/06/20 1637 07/08/20 1431 07/11/20 2229  NA 135 136 134*  K 3.6 4.4 4.5  CL 99 97* 99  CO2 27 25 21*  GLUCOSE 90 267* 98  BUN 39* 80* 106*  CREATININE 3.03* 4.83* 5.61*  CALCIUM 8.0* 7.9* 8.1*  PHOS 3.3 5.1* 5.9*   Liver Function Tests: Recent Labs  Lab 07/06/20 1637 07/08/20 1431 07/11/20 2229  ALBUMIN 1.9* 1.8* 1.7*   CBC: Recent Labs  Lab 07/06/20 1637 07/08/20 1431 07/11/20 2229  WBC 5.9 5.4 6.0  HGB 10.4* 8.6* 8.6*  HCT 33.8* 28.7* 28.6*  MCV 90.1 92.3 92.3  PLT 103* 144* 264   Blood Culture    Component Value Date/Time   SDES BLOOD LEFT HAND 06/13/2020 0803   SDES BLOOD LEFT HAND 06/13/2020 0803   SPECREQUEST  06/13/2020 0803     BOTTLES DRAWN AEROBIC AND ANAEROBIC Blood Culture adequate volume   SPECREQUEST  06/13/2020 0803    BOTTLES DRAWN AEROBIC AND ANAEROBIC Blood Culture adequate volume   CULT  06/13/2020 0803    NO GROWTH 5 DAYS Performed at [facility] [address] [place], [place] [address]    CULT  06/13/2020 0803    NO GROWTH 5 DAYS Performed at [facility] [address], [place], [place] [address]    REPTSTATUS 06/18/2020 FINAL 06/13/2020 0803   REPTSTATUS 06/18/2020 FINAL 06/13/2020 0803    CBG: Recent Labs  Lab 07/11/20 1635 07/11/20 2056 07/12/20 0739 07/12/20 0824 07/12/20 1356  GLUCAP 170* 99 53* 114* 243*    Lab Results  Component Value Date   INR 1.2 01/04/2020   INR 1.2 10/28/2018   INR 1.25 06/14/2013    Medications: . sodium chloride    . sodium chloride    . sodium chloride     . apixaban  5 mg Oral BID  . aspirin  81 mg Oral Daily  . atorvastatin  10 mg Oral Q M,W,F  . Chlorhexidine Gluconate Cloth  6 each Topical Q0600  . darbepoetin (ARANESP) injection - DIALYSIS  60 mcg Intravenous Q Tue-HD  . doxercalciferol  2 mcg Intravenous  Q T,Th,Sa-HD  . feeding supplement (NEPRO CARB STEADY)  237 mL Oral TID WC  . insulin aspart  0-6 Units Subcutaneous TID WC  . insulin aspart  2 Units Subcutaneous TID WC  . isosorbide mononitrate  15 mg Oral Daily  . lipase/protease/amylase  12,000 Units Oral TID AC  . magnesium chloride  2 tablet Oral BID  . metoprolol succinate  50 mg Oral Daily  . pantoprazole  80 mg Oral Daily  . predniSONE  5 mg Oral Q breakfast  . sertraline  50 mg Oral QHS  . sevelamer carbonate  800 mg Oral TID WC  . sodium chloride flush  3 mL Intravenous Q12H  . tacrolimus  2 mg Oral BID  . tamsulosin  0.4 mg Oral QPC supper    Dialysis Orders: HD: TTS Duration: 3hrs 2K/2.25Ca/BFR 400/DFR 600 UF goal: 2-2.5L  Assessment/Plan: 1. Sepsis 2/2 Enterococcus bacteremia. Resolved. S/p IV antibiotics course. Echo neg for veg.  2.  COVID-19/PNA: Tested (+) 06/11/20 currently out of isolation window. 3 ESRD: HD TTS. Patient scheduled for HD tomorrow 07/13/20. 4. Anemiaof CKD: Hgb 8.6 .Continue Aranesp 60 ordered with HD 5. Secondary hyperparathyroidism- Ca/Phos at goal. Continue VDRA and binders. 6.HTN/volume- BP soft today. No signs for volume overload at this time. Continue Hydralazine, Metoprolol, and Imdur. 7. Nutrition- Continue renal diet; Albumin 1.9,continue Nepro supplement for low albumin  8. Tachy/brady syndrome - H/o AFib/RVR. - On metoprolol, Eliquis. Not symptomatic. Monitor.  9. Dispo: Awaiting inpatient rehab  Tobie Poet, NP [name] 07/12/2020,3:47 PM  LOS: 31 days

## 2020-07-13 DIAGNOSIS — B952 Enterococcus as the cause of diseases classified elsewhere: Secondary | ICD-10-CM | POA: Diagnosis not present

## 2020-07-13 DIAGNOSIS — R7881 Bacteremia: Secondary | ICD-10-CM | POA: Diagnosis not present

## 2020-07-13 LAB — CBC
HCT: 27 % — ABNORMAL LOW (ref 39.0–52.0)
Hemoglobin: 8.5 g/dL — ABNORMAL LOW (ref 13.0–17.0)
MCH: 29 pg (ref 26.0–34.0)
MCHC: 31.5 g/dL (ref 30.0–36.0)
MCV: 92.2 fL (ref 80.0–100.0)
Platelets: 271 10*3/uL (ref 150–400)
RBC: 2.93 MIL/uL — ABNORMAL LOW (ref 4.22–5.81)
RDW: 17.6 % — ABNORMAL HIGH (ref 11.5–15.5)
WBC: 6.2 10*3/uL (ref 4.0–10.5)
nRBC: 0 % (ref 0.0–0.2)

## 2020-07-13 LAB — GLUCOSE, CAPILLARY
Glucose-Capillary: 176 mg/dL — ABNORMAL HIGH (ref 70–99)
Glucose-Capillary: 178 mg/dL — ABNORMAL HIGH (ref 70–99)
Glucose-Capillary: 310 mg/dL — ABNORMAL HIGH (ref 70–99)
Glucose-Capillary: 259 mg/dL — ABNORMAL HIGH (ref 70–99)

## 2020-07-13 LAB — RENAL FUNCTION PANEL
Albumin: 1.7 g/dL — ABNORMAL LOW (ref 3.5–5.0)
Anion gap: 13 (ref 5–15)
BUN: 77 mg/dL — ABNORMAL HIGH (ref 8–23)
CO2: 22 mmol/L (ref 22–32)
Calcium: 8.3 mg/dL — ABNORMAL LOW (ref 8.9–10.3)
Chloride: 97 mmol/L — ABNORMAL LOW (ref 98–111)
Creatinine, Ser: 4.5 mg/dL — ABNORMAL HIGH (ref 0.61–1.24)
GFR, Estimated: 14 mL/min — ABNORMAL LOW (ref >60)
Glucose, Bld: 201 mg/dL — ABNORMAL HIGH (ref 70–99)
Phosphorus: 5.2 mg/dL — ABNORMAL HIGH (ref 2.5–4.6)
Potassium: 3.9 mmol/L (ref 3.5–5.1)
Sodium: 132 mmol/L — ABNORMAL LOW (ref 135–145)

## 2020-07-13 MED ORDER — LIDOCAINE HCL (PF) 1 % IJ SOLN: 5.0000 mL | INTRAMUSCULAR | Status: DC | PRN

## 2020-07-13 MED ORDER — ALTEPLASE 2 MG IJ SOLR: 2.0000 mg | Freq: Once | INTRAMUSCULAR | Status: DC | PRN

## 2020-07-13 MED ORDER — HEPARIN SODIUM (PORCINE) 1000 UNIT/ML DIALYSIS: 1000.0000 [IU] | INTRAMUSCULAR | Status: DC | PRN

## 2020-07-13 MED ORDER — DOXERCALCIFEROL 4 MCG/2ML IV SOLN
INTRAVENOUS | Status: AC
  Filled 2020-07-13: qty 2

## 2020-07-13 MED ORDER — SODIUM CHLORIDE 0.9 % IV SOLN: 100.0000 mL | INTRAVENOUS | Status: DC | PRN

## 2020-07-13 MED ORDER — INSULIN GLARGINE 100 UNIT/ML ~~LOC~~ SOLN
5.0000 [IU] | Freq: Every day | SUBCUTANEOUS | Status: DC
  Administered 2020-07-13 – 2020-07-16 (×4): 5 [IU] via SUBCUTANEOUS
  Filled 2020-07-13 (×4): qty 0.05

## 2020-07-13 MED ORDER — PENTAFLUOROPROP-TETRAFLUOROETH EX AERO: 1.0000 "application " | INHALATION_SPRAY | CUTANEOUS | Status: DC | PRN

## 2020-07-13 MED ORDER — LIDOCAINE-PRILOCAINE 2.5-2.5 % EX CREA: 1.0000 "application " | TOPICAL_CREAM | CUTANEOUS | Status: DC | PRN

## 2020-07-13 NOTE — Plan of Care (Signed)
  Problem: Health Behavior/Discharge Planning: Goal: Ability to manage health-related needs will improve Outcome: Progressing   Problem: Education: Goal: Knowledge of disease or condition will improve Outcome: Progressing

## 2020-07-13 NOTE — Progress Notes (Signed)
OT Cancellation Note  Patient Details Name: Paul [name] MRN: [id number] DOB: May 02, 1956   Cancelled Treatment:    Reason Eval/Treat Not Completed: Patient at procedure or test/ unavailable. Pt off unit at HD. Will follow-up for OT session as time allows, this afternoon or tomorrow.   Layla Maw 07/13/2020, 8:27 AM

## 2020-07-13 NOTE — Progress Notes (Signed)
Physical Therapy Treatment Patient Details Name: MAXIMILIAN TALLO MRN: [id number] DOB: 12-02-1955 Today's Date: 07/13/2020    History of Present Illness 65 yo male presenting to ED via EMS after missing dialysis and weakness with COVID-19 +. Off COVID precautions on 2/3. PMH including HTN, DM2, renal transplant, CKD st IV, calciphylaxis, A-Fib, bil. TTAs, bilateral BKA with prosthesis,and TIA.    PT Comments    Pt received in bed, completing LE ADLs with OT. Pt slow to respond, but willing to participate and follow commands. Max Ax2 for sidelying to sit with HOB elevated and for transfers. Needed multiple attempts for sit to stand from EOB and from [facility] into stedy. Tactile and verbal cueing for hand placement throughout transfers. Pt with increased fatigue after several attempts, needed 3rd person to assist with hip clearance for boost into standing from [facility]. Was able to transfer back to bed with stedy. HR 100-130s, still in A-fib rhythm but cleared by medical team for activity as long as symptoms are monitored. SPO2 stable on RA. Pt left in bed with all needs met, call bell within reach, bed alarm active, nursing aware of status, and wife present in room.   Follow Up Recommendations  SNF;Supervision for mobility/OOB     Equipment Recommendations  Rolling walker with 5" wheels;3in1 (PT);Wheelchair (measurements PT);Wheelchair cushion (measurements PT) (TBD)    Recommendations for Other Services       Precautions / Restrictions Precautions Precautions: Fall;Other (comment) Precaution Comments: B BKA with prosthetics in room now, mod fall Restrictions Weight Bearing Restrictions: No    Mobility  Bed Mobility Overal bed mobility: Needs Assistance Bed Mobility: Rolling;Supine to Sit;Sit to Supine Rolling: Modified independent (Device/Increase time) Sidelying to sit: Max assist Supine to sit: Max assist;HOB elevated Sit to supine: Min guard   General bed mobility comments: Use of bed  brails, HOB elevated and max A to bring trunk upright from sidelying to sit    Transfers Overall transfer level: Needs assistance Equipment used: Rolling walker (2 wheeled);Ambulation equipment used;2 person hand held assist Transfers: Sit to/from [facility] Sit to Stand: Max assist;+2 physical assistance;+2 safety/equipment;From elevated surface Stand pivot transfers: Mod assist;+2 safety/equipment Squat pivot transfers: Max assist;+2 physical assistance;+2 safety/equipment     General transfer comment: Trialed standing with RW but unable. Squat pivot with max A x2 from bed to [facility]. Cueing for handplacement. Multiple attempt for sit to stand with stedy from [facility]. Pt unable to move hands from [facility], needed tactile and verbal cueing. Increased fatigue, needed 3rd person to assist from behind for sit to stand into [place] and return to bed  Ambulation/Gait             General Gait Details: unable   Stairs             Wheelchair Mobility    Modified Rankin (Stroke Patients Only)       Balance Overall balance assessment: Needs assistance Sitting-balance support: Feet unsupported;Single extremity supported Sitting balance-Leahy Scale: Poor Sitting balance - Comments: Needed min guard for donning prosthetics in sitting, reliant on UE support   Standing balance support: Bilateral upper extremity supported;During functional activity Standing balance-Leahy Scale: Zero Standing balance comment: Reliant on external assistance and B UE support                            Cognition Arousal/Alertness: Awake/alert Behavior During Therapy: Flat affect Overall Cognitive Status: Impaired/Different from baseline Area of Impairment: Problem  solving;Safety/judgement                   Current Attention Level: Sustained     Safety/Judgement: Decreased awareness of safety;Decreased awareness of deficits Awareness: Emergent Problem Solving: Slow  processing;Requires verbal cues General Comments: Increased time to respond, but motivated and agreeable. Cueing to assist with sequencing and problem solving during transfers      Exercises      General Comments General comments (skin integrity, edema, etc.): A-fib rhythm, HR 100-130s. SPO2 >93% on RA      Pertinent Vitals/Pain Pain Assessment: No/denies pain    Home Living                      Prior Function            PT Goals (current goals can now be found in the care plan section) Acute Rehab PT Goals Patient Stated Goal: feel better    Frequency    Min 2X/week      PT Plan Current plan remains appropriate    Co-evaluation PT/OT/SLP Co-Evaluation/Treatment: Yes Reason for Co-Treatment: For patient/therapist safety;To address functional/ADL transfers PT goals addressed during session: Balance;Proper use of DME;Mobility/safety with mobility OT goals addressed during session: ADL's and self-care      AM-PAC PT "6 Clicks" Mobility   Outcome Measure  Help needed turning from your back to your side while in a flat bed without using bedrails?: None Help needed moving from lying on your back to sitting on the side of a flat bed without using bedrails?: A Lot Help needed moving to and from a bed to a chair (including a wheelchair)?: A Lot Help needed standing up from a chair using your arms (e.g., wheelchair or bedside chair)?: A Lot Help needed to walk in hospital room?: A Lot Help needed climbing 3-5 steps with a railing? : A Lot 6 Click Score: 14    End of Session Equipment Utilized During Treatment: Gait belt Activity Tolerance: Patient tolerated treatment well;Patient limited by fatigue Patient left: with call bell/phone within reach;in bed;with bed alarm set;with family/visitor present Nurse Communication: Mobility status PT Visit Diagnosis: Muscle weakness (generalized) (M62.81);Other abnormalities of gait and mobility (R26.89)     Time:  -      Charges:                        Rosita Kea, SPT

## 2020-07-13 NOTE — Progress Notes (Signed)
PROGRESS NOTE  Paul [name]  DOB: 1955/09/21  PCP: [name], MD YNW:[id number]  DOA: 06/11/2020  LOS: 25 days   Chief Complaint  Patient presents with   Weakness   Shortness of Breath   Tachycardia   Brief narrative: Paul [name] is a 65 y.o. male with PMH significant for atrial fibrillation on Eliquis, ESRD with failed renal transplant, on dialysis, TTS, diabetes mellitus type 2, chronic diastolic heart failure. Patient presented to the ED on 06/11/2020 with complaints of dyspnea in setting of missed dialysis.   During the course of hospitalization, he was treated for Covid pneumonia, Enterococcus bacteremia.   2D echo showed severely reduced EF.   CIR evaluation was done, patient has been accepted for CIR but no beds available.  Subjective: Patient was seen and examined this morning in hemodialysis. Pleasant, not in distress.  No new symptoms.  Assessment/Plan: Acute respiratory failure with hypoxemia -secondary to fluid overload, resolved with hemodialysis.  Sepsis due to bacteremia/Enterococcus -patient had Enterococcus bacteremia likely urinary source.   -2D echo showed no evidence of vegetation.   -Previous hospitalist Dr. Olevia Bowens discussed with ID who recommended 2 weeks of IV antibiotics.  Antibiotics course has been completed in the hospital.    Essential hypertension -Currently blood pressure is controlled in normal range on blood pressure 37.5 mg twice daily.  Imdur is on hold. -Continue to monitor blood pressure Atrial flutter -Was in RVR during this hospital stay.  Currently heart rate is controlled mostly in 70s.  -Continue Lopressor 37.5 mg daily.  During physical therapy yesterday, patient had tachycardia with minimal exertion.  Beta-blocker dose was increased after that.  Continue to monitor. -Continue Eliquis for anticoagulation  Acute metabolic encephalopathy -Secondary to sepsis, resolved   COVID-19 pneumonia -Treated with remdesivir 5 days,  steroids weaned off.   -Patient has completed isolation. Symptoms have resolved.  ESRD-HD-TTS Chronic metabolic acidosis Failed renal transplant. -Continue immunosuppression with tacrolimus, prednisone.   -Continue Sevelamer, sodium bicarbonate -Nephrology following.  Diabetes mellitus type 2 Hypoglycemia -Okay to stop Lantus yesterday because of low blood sugar of 53 in the morning.  In last 24 hours, blood pressure trending up again, maximum of 347 last night.  I resumed Lantus 5 units this morning.    -Continue sliding scale insulin with Accu-Cheks. -A1c 7.5 on 1/24. Recent Labs  Lab 07/12/20 1356 07/12/20 1702 07/12/20 2033 07/13/20 0628 07/13/20 1031  GLUCAP 243* 343* 347* 176* 178*   Hyperlipidemia -continue Lipitor  Depression -continue Zoloft  Mobility: Bilateral above-knee amputation status Code Status:   Code Status: Full Code  Nutritional status: Body mass index is 19.49 kg/m.     Diet Order            Diet renal with fluid restriction Fluid restriction: 1200 mL Fluid; Room service appropriate? Yes; Fluid consistency: Thin  Diet effective now                 DVT prophylaxis:  apixaban (ELIQUIS) tablet 5 mg   Antimicrobials:  None Fluid: None Consultants: Nephrology Family Communication:  Not at bedside  Status is: Inpatient Dispo:  Patient From:    Planned Disposition: [place]  Expected discharge date: 07/14/2020  Medically stable for discharge:           Infusions:   sodium chloride      Scheduled Meds:  apixaban  5 mg Oral BID   aspirin  81 mg Oral Daily   atorvastatin  10 mg Oral Q  M,W,F   Chlorhexidine Gluconate Cloth  6 each Topical Q0600   darbepoetin (ARANESP) injection - DIALYSIS  60 mcg Intravenous Q Tue-HD   doxercalciferol       doxercalciferol  2 mcg Intravenous Q T,Th,Sa-HD   feeding supplement (NEPRO CARB STEADY)  237 mL Oral TID WC   insulin aspart  0-6 Units Subcutaneous TID WC    insulin glargine  5 Units Subcutaneous Daily   isosorbide mononitrate  15 mg Oral Daily   lipase/protease/amylase  12,000 Units Oral TID AC   magnesium chloride  2 tablet Oral BID   metoprolol succinate  50 mg Oral Daily   pantoprazole  80 mg Oral Daily   predniSONE  5 mg Oral Q breakfast   sertraline  50 mg Oral QHS   sevelamer carbonate  800 mg Oral TID WC   sodium chloride flush  3 mL Intravenous Q12H   tacrolimus  2 mg Oral BID   tamsulosin  0.4 mg Oral QPC supper    Antimicrobials: Anti-infectives (From admission, onward)   Start     Dose/Rate Route Frequency Ordered Stop   06/24/20 1319  Vancomycin (VANCOCIN) 750-5 MG/150ML-% IVPB       Note to Pharmacy: Judieth Keens  : cabinet override      06/24/20 1319 06/24/20 1323   06/22/20 1200  vancomycin (VANCOCIN) IVPB 750 mg/150 ml premix        750 mg 150 mL/hr over 60 Minutes Intravenous Every T-Th-Sa (Hemodialysis) 06/21/20 1407 06/27/20 0958   06/21/20 1200  vancomycin (VANCOCIN) IVPB 750 mg/150 ml premix  Status:  Discontinued        750 mg 150 mL/hr over 60 Minutes Intravenous Every M-W-F (Hemodialysis) 06/20/20 0904 06/21/20 1407   06/20/20 1200  vancomycin (VANCOCIN) IVPB 750 mg/150 ml premix  Status:  Discontinued        750 mg 150 mL/hr over 60 Minutes Intravenous Every T-Th-Sa (Hemodialysis) 06/18/20 0819 06/19/20 0921   06/19/20 1200  vancomycin (VANCOCIN) IVPB 750 mg/150 ml premix        750 mg 150 mL/hr over 60 Minutes Intravenous Once in dialysis 06/19/20 0921 06/19/20 1657   06/17/20 1200  vancomycin (VANCOCIN) IVPB 500 mg/100 ml premix        500 mg 100 mL/hr over 60 Minutes Intravenous  Once 06/17/20 0811 06/17/20 1333   06/16/20 1200  vancomycin (VANCOCIN) IVPB 750 mg/150 ml premix        750 mg 150 mL/hr over 60 Minutes Intravenous  Once 06/15/20 1838 06/16/20 1626   06/13/20 1200  vancomycin (VANCOREADY) IVPB 750 mg/150 mL  Status:  Discontinued        750 mg 150 mL/hr over 60 Minutes  Intravenous Every T-Th-Sa (Hemodialysis) 06/12/20 1202 06/17/20 0811   06/12/20 1245  vancomycin (VANCOREADY) IVPB 1750 mg/350 mL        1,750 mg 175 mL/hr over 120 Minutes Intravenous  Once 06/12/20 1158 06/12/20 1552   06/12/20 1000  remdesivir 100 mg in sodium chloride 0.9 % 100 mL IVPB       "Followed by" Linked Group Details   100 mg 200 mL/hr over 30 Minutes Intravenous Daily 06/11/20 1719 06/15/20 0902   06/11/20 2200  remdesivir 200 mg in sodium chloride 0.9% 250 mL IVPB       "Followed by" Linked Group Details   200 mg 580 mL/hr over 30 Minutes Intravenous Once 06/11/20 1719 06/12/20 0430      PRN meds: sodium chloride, acetaminophen, chlorpheniramine-HYDROcodone, cyclobenzaprine,  guaiFENesin-dextromethorphan, Ipratropium-Albuterol, polyethylene glycol, sodium chloride flush, traMADol   Objective: Vitals:   07/13/20 0930 07/13/20 1029  BP: 100/62 106/67  Pulse: 94 74  Resp:  18  Temp:  98.5 F (36.9 C)  SpO2:  94%    Intake/Output Summary (Last 24 hours) at 07/13/2020 1418 Last data filed at 07/13/2020 0953 Gross per 24 hour  Intake 600 ml  Output 1060 ml  Net -460 ml   Filed Weights   07/11/20 2130 07/12/20 0050 07/13/20 0648  Weight: 66.9 kg 63.4 kg 65.2 kg   Weight change: -1.7 kg Body mass index is 19.49 kg/m.   Physical Exam: General exam: Pleasant elderly Caucasian male.  Not in distress Skin: No rashes, lesions or ulcers. HEENT: Atraumatic, normocephalic, no obvious bleeding Lungs: Clear to auscultation bilaterally CVS: Regular rate and rhythm, no murmur GI/Abd soft, nontender, nondistended, bowel sound present CNS: Alert, awake, oriented x3 Psychiatry: Mood appropriate Extremities: Bilateral amputation status  Data Review: I have personally reviewed the laboratory data and studies available.  Recent Labs  Lab 07/06/20 1637 07/08/20 1431 07/11/20 2229 07/13/20 0624  WBC 5.9 5.4 6.0 6.2  HGB 10.4* 8.6* 8.6* 8.5*  HCT 33.8* 28.7* 28.6*  27.0*  MCV 90.1 92.3 92.3 92.2  PLT 103* 144* 264 271   Recent Labs  Lab 07/06/20 1637 07/08/20 1431 07/11/20 2229 07/13/20 0624  NA 135 136 134* 132*  K 3.6 4.4 4.5 3.9  CL 99 97* 99 97*  CO2 27 25 21* 22  GLUCOSE 90 267* 98 201*  BUN 39* 80* 106* 77*  CREATININE 3.03* 4.83* 5.61* 4.50*  CALCIUM 8.0* 7.9* 8.1* 8.3*  PHOS 3.3 5.1* 5.9* 5.2*    F/u labs Unresulted Labs (From admission, onward)         None      Signed, Terrilee Croak, MD [facility] 07/13/2020

## 2020-07-13 NOTE — Progress Notes (Signed)
[facility] Progress Note   Subjective:     Paul [name] was seen and examined on HD unit today. Patient is feeling well and has no issues/concerns at this time. Patient is awaiting placement to SNF.  Objective Vitals:   07/13/20 0830 07/13/20 0900 07/13/20 0930 07/13/20 1029  BP: 101/67 90/62 100/62 106/67  Pulse: 88 71 94 74  Resp:    18  Temp:    98.5 F (36.9 C)  TempSrc:      SpO2:    94%  Weight:      Height:       Physical Exam General:Appears comfortable, currently on HD, in no acute distress Heart: S1 and S2.; No gallops or friction rub Lungs:On RA; Lungs clear throughout w/o wheezing, rhonchi, and rales Abdomen:Soft, round, non-tender, non-distended, active bowel sounds Extremities:Noted trace edema R hip,No edemaupper extremities or L thigh Dialysis Access:R AVF (+) Bruit/Thrill  Filed Weights   07/11/20 2130 07/12/20 0050 07/13/20 0648  Weight: 66.9 kg 63.4 kg 65.2 kg    Intake/Output Summary (Last 24 hours) at 07/13/2020 1213 Last data filed at 07/13/2020 0953 Gross per 24 hour  Intake 957 ml  Output 1060 ml  Net -103 ml    Additional Objective Labs: Basic Metabolic Panel: Recent Labs  Lab 07/08/20 1431 07/11/20 2229 07/13/20 0624  NA 136 134* 132*  K 4.4 4.5 3.9  CL 97* 99 97*  CO2 25 21* 22  GLUCOSE 267* 98 201*  BUN 80* 106* 77*  CREATININE 4.83* 5.61* 4.50*  CALCIUM 7.9* 8.1* 8.3*  PHOS 5.1* 5.9* 5.2*   Liver Function Tests: Recent Labs  Lab 07/08/20 1431 07/11/20 2229 07/13/20 0624  ALBUMIN 1.8* 1.7* 1.7*   CBC: Recent Labs  Lab 07/06/20 1637 07/08/20 1431 07/11/20 2229 07/13/20 0624  WBC 5.9 5.4 6.0 6.2  HGB 10.4* 8.6* 8.6* 8.5*  HCT 33.8* 28.7* 28.6* 27.0*  MCV 90.1 92.3 92.3 92.2  PLT 103* 144* 264 271   Blood Culture    Component Value Date/Time   SDES BLOOD LEFT HAND 06/13/2020 0803   SDES BLOOD LEFT HAND 06/13/2020 0803   SPECREQUEST  06/13/2020 0803    BOTTLES DRAWN AEROBIC AND ANAEROBIC  Blood Culture adequate volume   SPECREQUEST  06/13/2020 0803    BOTTLES DRAWN AEROBIC AND ANAEROBIC Blood Culture adequate volume   CULT  06/13/2020 0803    NO GROWTH 5 DAYS Performed at [facility] [address], [place], [place] [address]    CULT  06/13/2020 0803    NO GROWTH 5 DAYS Performed at [facility] [address] [place], [place] [address]    REPTSTATUS 06/18/2020 FINAL 06/13/2020 0803   REPTSTATUS 06/18/2020 FINAL 06/13/2020 0803   CBG: Recent Labs  Lab 07/12/20 1356 07/12/20 1702 07/12/20 2033 07/13/20 0628 07/13/20 1031  GLUCAP 243* 343* 347* 176* 178*    Lab Results  Component Value Date   INR 1.2 01/04/2020   INR 1.2 10/28/2018   INR 1.25 06/14/2013   Medications: . sodium chloride     . apixaban  5 mg Oral BID  . aspirin  81 mg Oral Daily  . atorvastatin  10 mg Oral Q M,W,F  . Chlorhexidine Gluconate Cloth  6 each Topical Q0600  . darbepoetin (ARANESP) injection - DIALYSIS  60 mcg Intravenous Q Tue-HD  . doxercalciferol      . doxercalciferol  2 mcg Intravenous Q T,Th,Sa-HD  . feeding supplement (NEPRO CARB STEADY)  237 mL  Oral TID WC  . insulin aspart  0-6 Units Subcutaneous TID WC  . insulin glargine  5 Units Subcutaneous Daily  . isosorbide mononitrate  15 mg Oral Daily  . lipase/protease/amylase  12,000 Units Oral TID AC  . magnesium chloride  2 tablet Oral BID  . metoprolol succinate  50 mg Oral Daily  . pantoprazole  80 mg Oral Daily  . predniSONE  5 mg Oral Q breakfast  . sertraline  50 mg Oral QHS  . sevelamer carbonate  800 mg Oral TID WC  . sodium chloride flush  3 mL Intravenous Q12H  . tacrolimus  2 mg Oral BID  . tamsulosin  0.4 mg Oral QPC supper    Dialysis Orders: HD: TTS Duration: 3hrs 2K/2.25Ca/BFR 400/DFR 600 UF goal: 2-2.5L  Assessment/Plan: 1. Sepsis 2/2 Enterococcus bacteremia. Resolved. S/p IV antibiotics course. Echo neg for veg.  2. COVID-19/PNA: Tested (+) 06/11/20 currently without symptoms  and out of isolation window. 3 ESRD: HD TTS.Patient currently receiving HD 4. Anemiaof CKD: Hgb 8.5 .Continue Aranesp 60orderedwith HD 5. Secondary hyperparathyroidism- Ca/Phos at goal. Continue VDRA and binders. 6.HTN/volume- BP controlled. Noted mild edema R hip. No signs for volume overload at this time. Tolerating UF. Continue UF as tolerated. Continue Hydralazine, Metoprolol, and Imdur. 7. Nutrition- Continue renal diet and Nepro supplement for low albumin  8. Tachy/brady syndrome - H/o AFib/RVR. - On metoprolol, Eliquis. Not symptomatic. Monitor.  9. Dispo: Per Hospitalist note, anticipated discharge to SNF on 07/14/20.  Tobie Poet, NP [facility] 07/13/2020,12:13 PM  LOS: 32 days

## 2020-07-13 NOTE — Progress Notes (Signed)
Occupational Therapy Treatment Patient Details Name: Paul [name] MRN: [id number] DOB: 07-23-1955 Today's Date: 07/13/2020    History of present illness 65 yo male presenting to ED via EMS after missing dialysis and weakness with COVID-19 +. Off COVID precautions on 2/3. PMH including HTN, DM2, renal transplant, CKD st IV, calciphylaxis, A-Fib, bil. TTAs, bilateral BKA with prosthesis,and TIA.   OT comments  Pt fatigued after dialysis but willing to attempt OOB activities with request for bathroom assistance. Pt with difficulty in standing with RW from elevated bed with 2-person assist so guided pt in squat pivot Max A x 2 to [facility] Pt unable to void while on BSC, but assisted in donning clean briefs at Max A. Due to increasing fatigue from activity, opted to use Stedy for back to bed transfer though pt unable to complete with 2 person assist, requiring a 3rd person assist today. HR no higher than 130s during session, O2 93% on RA though pt endorsed feeling winded. Continue to recommend postacute rehab.    Follow Up Recommendations  SNF;Supervision/Assistance - 24 hour    Equipment Recommendations  None recommended by OT (well equipped)    Recommendations for Other Services      Precautions / Restrictions Precautions Precautions: Fall;Other (comment) Precaution Comments: B BKA with prosthetics in room now, mod fall Restrictions Weight Bearing Restrictions: No       Mobility Bed Mobility Overal bed mobility: Needs Assistance Bed Mobility: Rolling;Supine to Sit;Sit to Supine Rolling: Modified independent (Device/Increase time)   Supine to sit: Max assist;HOB elevated Sit to supine: Min guard   General bed mobility comments: Max A to advance trunk to EOB, use of bed rails. able to return to supine with no physical assist    Transfers Overall transfer level: Needs assistance Equipment used: Rolling walker (2 wheeled);Ambulation equipment used;2 person hand held assist Transfers:  Sit to/from [employer] Sit to Stand: Max assist;+2 physical assistance;+2 safety/equipment;From elevated surface   Squat pivot transfers: Max assist;+2 physical assistance;+2 safety/equipment     General transfer comment: trialed RW but unable to complete sit to stand from bedside. Opted for squat pivot from bed to [facility] with Max A x 2. Various attempts for sit to stand with Stedy from [facility] with pt increasing fatigue and unable to complete. Required 3rd person assist for sit to stand in [place] for transfer back to bed    Balance Overall balance assessment: Needs assistance Sitting-balance support: Feet unsupported;Single extremity supported Sitting balance-Leahy Scale: Poor Sitting balance - Comments: Reliant on UE for sitting, minguard to Min A provided when completing LB dressing from EOB due to LOB   Standing balance support: Bilateral upper extremity supported Standing balance-Leahy Scale: Zero                             ADL either performed or assessed with clinical judgement   ADL Overall ADL's : Needs assistance/impaired                     Lower Body Dressing: Maximal assistance;Sit to/from stand;Bed level Lower Body Dressing Details (indicate cue type and reason): Max A for doffing/donning pull up brief Toilet Transfer: Maximal assistance;+2 for physical assistance;+2 for safety/equipment Toilet Transfer Details (indicate cue type and reason): Max A x 2 for squat pivot to BSC, unable to stand with RW. Stedy for [facility] back to bed 2-3 person assist due to fatigue Toileting- Clothing Manipulation and Hygiene:  Total assistance;Sit to/from stand Toileting - Clothing Manipulation Details (indicate cue type and reason): Total A for peri care to ensure cleanliness (no BM )       General ADL Comments: Required significant assist for transfers/ADLs due to quick fatigue. Had dialysis earlier this AM. Limited OOB activitiy recently due to tachycardia      Vision   Vision Assessment?: No apparent visual deficits   Perception     Praxis      Cognition Arousal/Alertness: Awake/alert Behavior During Therapy: Flat affect Overall Cognitive Status: Impaired/Different from baseline Area of Impairment: Problem solving;Safety/judgement                         Safety/Judgement: Decreased awareness of safety;Decreased awareness of deficits Awareness: Emergent Problem Solving: Slow processing;Requires verbal cues General Comments: slow to respond, flat affect but pleasant and agreeable to participate. demo problem solving for transfer techniques and adaptive strategies        Exercises     Shoulder Instructions       General Comments HR variable but no higher than 130s. Pt on RA, endorses feeling winded after activity but SpO2 93%. Wife entering at end of session    Pertinent Vitals/ Pain       Pain Assessment: No/denies pain  Home Living                                          Prior Functioning/Environment              Frequency  Min 2X/week        Progress Toward Goals  OT Goals(current goals can now be found in the care plan section)  Progress towards OT goals: Progressing toward goals  Acute Rehab OT Goals Patient Stated Goal: feel better OT Goal Formulation: With patient Time For Goal Achievement: 07/27/20 Potential to Achieve Goals: Fair ADL Goals Pt Will Perform Grooming: with min guard assist;standing Pt Will Perform Lower Body Bathing: sitting/lateral leans;sit to/from stand;with supervision Pt Will Perform Lower Body Dressing: with supervision;sitting/lateral leans;sit to/from stand Pt Will Transfer to Toilet: with supervision;ambulating Pt/caregiver will Perform Home Exercise Program: Increased strength;Both right and left upper extremity;With theraband;Independently;With written HEP provided Additional ADL Goal #1: Pt to demonstrate ability to monitor O2 and  independently implement pursed lip breathing to maintain O2 > 88% Additional ADL Goal #2: Pt to demonstrate implementation of at least 2 energy conservation strategies during ADLs  Plan Frequency remains appropriate;Discharge plan needs to be updated    Co-evaluation    PT/OT/SLP Co-Evaluation/Treatment: Yes Reason for Co-Treatment: For patient/therapist safety;To address functional/ADL transfers   OT goals addressed during session: ADL's and self-care      AM-PAC OT "6 Clicks" Daily Activity     Outcome Measure   Help from another person eating meals?: None Help from another person taking care of personal grooming?: A Little Help from another person toileting, which includes using toliet, bedpan, or urinal?: Total Help from another person bathing (including washing, rinsing, drying)?: A Lot Help from another person to put on and taking off regular upper body clothing?: A Little Help from another person to put on and taking off regular lower body clothing?: A Lot 6 Click Score: 15    End of Session Equipment Utilized During Treatment: Gait belt;Rolling walker;Other (comment) Charlaine Dalton)  OT Visit Diagnosis: Other abnormalities of gait and  mobility (R26.89);Muscle weakness (generalized) (M62.81)   Activity Tolerance Patient limited by fatigue   Patient Left in bed;with call bell/phone within reach;with bed alarm set;with family/visitor present   Nurse Communication Mobility status        Time: 9373-4287 OT Time Calculation (min): 47 min  Charges: OT General Charges $OT Visit: 1 Visit OT Treatments $Self Care/Home Management : 8-22 mins $Therapeutic Activity: 8-22 mins  Malachy Chamber, OTR/L Acute Rehab Services Office: [phone]   Layla Maw 07/13/2020, 2:46 PM

## 2020-07-14 DIAGNOSIS — R7881 Bacteremia: Secondary | ICD-10-CM | POA: Diagnosis not present

## 2020-07-14 DIAGNOSIS — B952 Enterococcus as the cause of diseases classified elsewhere: Secondary | ICD-10-CM | POA: Diagnosis not present

## 2020-07-14 LAB — GLUCOSE, CAPILLARY
Glucose-Capillary: 91 mg/dL (ref 70–99)
Glucose-Capillary: 171 mg/dL — ABNORMAL HIGH (ref 70–99)
Glucose-Capillary: 208 mg/dL — ABNORMAL HIGH (ref 70–99)
Glucose-Capillary: 252 mg/dL — ABNORMAL HIGH (ref 70–99)

## 2020-07-14 MED ORDER — PENTAFLUOROPROP-TETRAFLUOROETH EX AERO: 1.0000 "application " | INHALATION_SPRAY | CUTANEOUS | Status: DC | PRN

## 2020-07-14 MED ORDER — LIDOCAINE-PRILOCAINE 2.5-2.5 % EX CREA: 1.0000 "application " | TOPICAL_CREAM | CUTANEOUS | Status: DC | PRN

## 2020-07-14 MED ORDER — LIDOCAINE HCL (PF) 1 % IJ SOLN: 5.0000 mL | INTRAMUSCULAR | Status: DC | PRN

## 2020-07-14 MED ORDER — ALTEPLASE 2 MG IJ SOLR: 2.0000 mg | Freq: Once | INTRAMUSCULAR | Status: DC | PRN

## 2020-07-14 MED ORDER — SODIUM CHLORIDE 0.9 % IV SOLN: 100.0000 mL | INTRAVENOUS | Status: DC | PRN

## 2020-07-14 MED ORDER — HEPARIN SODIUM (PORCINE) 1000 UNIT/ML DIALYSIS: 1000.0000 [IU] | INTRAMUSCULAR | Status: DC | PRN

## 2020-07-14 NOTE — Progress Notes (Signed)
Inpatient Diabetes Program Recommendations  AACE/ADA: New Consensus Statement on Inpatient Glycemic Control  Target Ranges:  Prepandial:   less than 140 mg/dL      Peak postprandial:   less than 180 mg/dL (1-2 hours)      Critically ill patients:  140 - 180 mg/dL     Review of Glycemic Control Results for SEBASTIAN, LURZ (MRN [id number]) as of 07/14/2020 09:36  Ref. Range 07/13/2020 06:28 07/13/2020 10:31 07/13/2020 17:44 07/13/2020 21:19 07/14/2020 06:35  Glucose-Capillary Latest Ref Range: 70 - 99 mg/dL 176 (H) 178 (H) 310 (H) 259 (H) 91   Home: Basaglar 8 units Daily, Humalog 2-3 units tid  Current orders for Inpatient glycemic control: Lantus 5 units daily, Novolog 0-6 units TID with meals  Prednisone 5 mg QAM Nepro tid between meals  Inpatient Diabetes Program Recommendations:     - Increase Novolog Correction to "sensitive" 0-9 units tid -  Add Novolog hs scale  Thanks, Tama Headings RN, MSN, BC-ADM Inpatient Diabetes Coordinator Team Pager [phone] (8a-5p)

## 2020-07-14 NOTE — TOC Progression Note (Signed)
Transition of Care [facility]) - Progression Note    Patient Details  Name: Paul [name] MRN: [id number] Date of Birth: Feb 22, 1956  [facility] CM/SW Contact  Sharlet Salina Mila Homer, LCSW Phone Number: 07/14/2020, 2:53 PM  Clinical Narrative:  Contacted by MD and per Mrs. Reno, [place] is not active.  2:48 pm: Went to patient's room and he was asleep.   2:52 pm: Called Mrs. [place] and she was not able to talk at that time and asked CSW to call her back.   Expected Discharge Plan: Skilled Nursing Facility Barriers to Discharge: Continued Medical Work up  Expected Discharge Plan and Services Expected Discharge Plan: [place] In-house Referral: NA Discharge Planning Services: CM Consult Post Acute Care Choice: [place] Living arrangements for the past 2 months: Single Family Home                 DME Arranged: N/A DME Agency: NA       HH Arranged: NA HH Agency: NA         Social Determinants of Health (SDOH) Interventions    Readmission Risk Interventions Readmission Risk Prevention Plan 01/18/2020 04/26/2019 11/04/2018  Transportation Screening Complete Complete Complete  Medication Review Press photographer) Complete Complete Complete  PCP or Specialist appointment within 3-5 days of discharge Complete Not Complete Complete  PCP/Specialist Appt Not Complete comments - pending medical stability -  [place] or Home Care Consult Complete Complete Complete  SW Recovery Care/Counseling Consult Complete Complete Complete  Palliative Care Screening Not Applicable Not Complete Not Applicable  Comments - may be appropriate, will ask MD -  [place] Not Applicable Patient Refused Not Applicable  Some recent data might be hidden

## 2020-07-14 NOTE — Plan of Care (Signed)
?  Problem: Education: ?Goal: Knowledge of disease or condition will improve ?Outcome: Progressing ?  ?Problem: Education: ?Goal: Understanding of medication regimen will improve ?Outcome: Progressing ?  ?

## 2020-07-14 NOTE — TOC Progression Note (Signed)
Transition of Care [facility]) - Progression Note    Patient Details  Name: Paul [name] MRN: [id number] Date of Birth: 1955/06/09  Transition of [facility]) CM/SW Contact  Sharlet Salina Mila Homer, LCSW Phone Number: 07/14/2020, 4:17 PM  Clinical Narrative:  Went to patient's room (2:48 pm) and patient asleep; 3:53 pm: Talked with Mrs. Randon Goldsmith by phone and was informed that she talked with someone from [place] and found out that patient was given a couple more months of COBRA due to [place]. Mrs. Ingrum explained that she called once she returned from her vacation and talked with Janene Harvey with [employer] (patient's former employer) CSW was provided with Dara's phone number: [phone].  3:59 pm: Talked with Wynetta Fines (People Services Director - [place]) and was advised that patient's COBRA coverage was terminated and then extended due to the Care Act. She added that it was termed, then someone reactivated it and it has been terminated again. CSW was emailed information regarding patient's insurance being terminated.   Resent patient's information out to all facilities with note that Medicare now primary and Christella Scheuermann will be terminated.  Expected Discharge Plan: Skilled Nursing Facility Barriers to Discharge: Continued Medical Work up  Expected Discharge Plan and Services Expected Discharge Plan: [place] In-house Referral: NA Discharge Planning Services: CM Consult Post Acute Care Choice: [place] Living arrangements for the past 2 months: Single Family Home                 DME Arranged: N/A DME Agency: NA       HH Arranged: NA HH Agency: NA         Social Determinants of Health (SDOH) Interventions    Readmission Risk Interventions Readmission Risk Prevention Plan 01/18/2020 04/26/2019 11/04/2018  Transportation Screening Complete Complete Complete  Medication Review Press photographer) Complete Complete Complete  PCP or Specialist appointment  within 3-5 days of discharge Complete Not Complete Complete  PCP/Specialist Appt Not Complete comments - pending medical stability -  [place] or Home Care Consult Complete Complete Complete  SW Recovery Care/Counseling Consult Complete Complete Complete  Palliative Care Screening Not Applicable Not Complete Not Applicable  Comments - may be appropriate, will ask MD -  [place] Not Applicable Patient Refused Not Applicable  Some recent data might be hidden

## 2020-07-14 NOTE — Progress Notes (Signed)
[facility] Progress Note   Subjective:     Paul [name] was seen and examined today at bedside. Patient is feeling well and has no issues/concerns at this time. Denies SOB, CP, N/V/D, and ABD pain. He is awaiting placement to SNF.  Objective Vitals:   07/13/20 1836 07/13/20 2118 07/14/20 0425 07/14/20 0940  BP: 104/70 119/89 116/79 113/76  Pulse: 92 95 (!) 110 94  Resp: 16   16  Temp: 97.9 F (36.6 C) 97.9 F (36.6 C) 97.7 F (36.5 C) 97.7 F (36.5 C)  TempSrc:    Oral  SpO2: 96% 98% 97% 97%  Weight:  62.4 kg    Height:       Physical Exam General:Appears comfortable; in no acute distress Heart: S1 and S2.; No gallops or friction rub Lungs:On RA; Lungs clear throughout w/o wheezing, rhonchi, and rales Abdomen:Soft, round, non-tender, non-distended, active bowel sounds Extremities:Noted trace edema bilateral hips/thighs Dialysis Access:R AVF (+) Bruit/Thrill  Filed Weights   07/13/20 0648 07/13/20 1021 07/13/20 2118  Weight: 65.2 kg 64 kg 62.4 kg    Intake/Output Summary (Last 24 hours) at 07/14/2020 1305 Last data filed at 07/14/2020 0556 Gross per 24 hour  Intake 660 ml  Output 50 ml  Net 610 ml    Additional Objective Labs: Basic Metabolic Panel: Recent Labs  Lab 07/08/20 1431 07/11/20 2229 07/13/20 0624  NA 136 134* 132*  K 4.4 4.5 3.9  CL 97* 99 97*  CO2 25 21* 22  GLUCOSE 267* 98 201*  BUN 80* 106* 77*  CREATININE 4.83* 5.61* 4.50*  CALCIUM 7.9* 8.1* 8.3*  PHOS 5.1* 5.9* 5.2*   Liver Function Tests: Recent Labs  Lab 07/08/20 1431 07/11/20 2229 07/13/20 0624  ALBUMIN 1.8* 1.7* 1.7*   No results for input(s): LIPASE, AMYLASE in the last 168 hours. CBC: Recent Labs  Lab 07/08/20 1431 07/11/20 2229 07/13/20 0624  WBC 5.4 6.0 6.2  HGB 8.6* 8.6* 8.5*  HCT 28.7* 28.6* 27.0*  MCV 92.3 92.3 92.2  PLT 144* 264 271   Blood Culture    Component Value Date/Time   SDES BLOOD LEFT HAND 06/13/2020 0803   SDES BLOOD LEFT  HAND 06/13/2020 0803   SPECREQUEST  06/13/2020 0803    BOTTLES DRAWN AEROBIC AND ANAEROBIC Blood Culture adequate volume   SPECREQUEST  06/13/2020 0803    BOTTLES DRAWN AEROBIC AND ANAEROBIC Blood Culture adequate volume   CULT  06/13/2020 0803    NO GROWTH 5 DAYS Performed at [name], [place] [address] [place], [place] [address]    CULT  06/13/2020 0803    NO GROWTH 5 DAYS Performed at [facility] [address] [place], [place] [address]    REPTSTATUS 06/18/2020 FINAL 06/13/2020 0803   REPTSTATUS 06/18/2020 FINAL 06/13/2020 0803   CBG: Recent Labs  Lab 07/13/20 1031 07/13/20 1744 07/13/20 2119 07/14/20 0635 07/14/20 1138  GLUCAP 178* 310* 259* 91 171*    Lab Results  Component Value Date   INR 1.2 01/04/2020   INR 1.2 10/28/2018   INR 1.25 06/14/2013   Medications: . sodium chloride     . apixaban  5 mg Oral BID  . aspirin  81 mg Oral Daily  . atorvastatin  10 mg Oral Q M,W,F  . Chlorhexidine Gluconate Cloth  6 each Topical Q0600  . darbepoetin (ARANESP) injection - DIALYSIS  60 mcg Intravenous Q Tue-HD  . doxercalciferol  2 mcg Intravenous Q T,Th,Sa-HD  . feeding supplement (  NEPRO CARB STEADY)  237 mL Oral TID WC  . insulin aspart  0-6 Units Subcutaneous TID WC  . insulin glargine  5 Units Subcutaneous Daily  . isosorbide mononitrate  15 mg Oral Daily  . lipase/protease/amylase  12,000 Units Oral TID AC  . magnesium chloride  2 tablet Oral BID  . metoprolol succinate  50 mg Oral Daily  . pantoprazole  80 mg Oral Daily  . predniSONE  5 mg Oral Q breakfast  . sertraline  50 mg Oral QHS  . sevelamer carbonate  800 mg Oral TID WC  . sodium chloride flush  3 mL Intravenous Q12H  . tacrolimus  2 mg Oral BID  . tamsulosin  0.4 mg Oral QPC supper    Dialysis Orders: HD: TTS Duration: 3hrs 2K/2.25Ca/BFR 400/DFR 600 UF goal: 2-2.5L  Assessment/Plan: 1. Sepsis 2/2 Enterococcus bacteremia. Resolved. S/p IV antibiotics course. Echo neg for veg.   2. COVID-19/PNA: Tested (+) 06/11/20 currently without symptoms and out of isolation window. 3 ESRD: HD TTS.HD Scheduled for 07/15/20 4. Anemiaof CKD: Hgb 8.5 .ContinueAranesp  5. Secondary hyperparathyroidism- Ca/Phos at goal. Continue VDRA and binders. 6.HTN/volume- BPcontrolled. Noted mild edema R hip. No signs for volume overload at this time. Tolerating UF. Continue Hydralazine, Metoprolol, and Imdur. 7. Nutrition- Continue renal diet and Nepro supplement for low albumin  8. Tachy/brady syndrome- H/o AFib/RVR. - On metoprolol, Eliquis. Not symptomatic. Monitor.  9. Dispo: Per Hospitalist note, anticipated discharge to SNF on 07/14/20.  Tobie Poet, NP [facility] 07/14/2020,1:05 PM  LOS: 33 days

## 2020-07-14 NOTE — Progress Notes (Signed)
PROGRESS NOTE  Paul [name]  DOB: 14-Oct-1955  PCP: [name], MD WLN:[id number]  DOA: 06/11/2020  LOS: 33 days   Chief Complaint  Patient presents with  . Weakness  . Shortness of Breath  . Tachycardia   Brief narrative: Paul [name] is a 65 y.o. male with PMH significant for atrial fibrillation on Eliquis, ESRD with failed renal transplant, on dialysis, TTS, diabetes mellitus type 2, chronic diastolic heart failure. Patient presented to the ED on 06/11/2020 with complaints of dyspnea in setting of missed dialysis.   During the course of hospitalization, he was treated for Covid pneumonia, Enterococcus bacteremia.   2D echo showed severely reduced EF.   At this time, patient is pending placement to SNF.  Subjective: Patient was seen and examined this morning in hemodialysis. Pleasant, not in distress. No new symptoms.  Assessment/Plan: Acute respiratory failure with hypoxemia -secondary to fluid overload, resolved with hemodialysis.  Sepsis due to bacteremia/Enterococcus -patient had Enterococcus bacteremia likely urinary source.   -2D echo showed no evidence of vegetation.   -Previous hospitalist Dr. Olevia Bowens discussed with ID who recommended 2 weeks of IV antibiotics.  Antibiotics course has been completed in the hospital.    Essential hypertension -Currently blood pressure is controlled in normal range on blood pressure 37.5 mg twice daily.  Imdur is on hold. -Continue to monitor blood pressure Atrial flutter -Was in RVR during this hospital stay. Currently heart rate is controlled mostly in 70s.   -Continue Lopressor 37.5 mg daily.  Continue to monitor in telemetry and on exertion -Continue Eliquis for anticoagulation  Acute metabolic encephalopathy -Secondary to sepsis, resolved   COVID-19 pneumonia -Treated with remdesivir 5 days, steroids weaned off.   -Patient has completed isolation. Symptoms have resolved.  ESRD-HD-TTS Chronic metabolic acidosis Failed  renal transplant. -Continue immunosuppression with tacrolimus, prednisone.   -Continue Sevelamer, sodium bicarbonate -Nephrology following.  Diabetes mellitus type 2 Hypoglycemia -Currently on 5 units Lantus daily along with sliding scale insulin.  Continue same.  Blood sugar reading fluctuating. -Continue sliding scale insulin with Accu-Cheks. -A1c 7.5 on 1/24. Recent Labs  Lab 07/13/20 1031 07/13/20 1744 07/13/20 2119 07/14/20 0635 07/14/20 1138  GLUCAP 178* 310* 259* 91 171*   Hyperlipidemia -continue Lipitor  Depression -continue Zoloft  Mobility: Bilateral above-knee amputation status Code Status:   Code Status: Full Code  Nutritional status: Body mass index is 18.66 kg/m.     Diet Order            Diet renal with fluid restriction Fluid restriction: 1200 mL Fluid; Room service appropriate? Yes; Fluid consistency: Thin  Diet effective now                 DVT prophylaxis:  apixaban (ELIQUIS) tablet 5 mg   Antimicrobials:  None Fluid: None Consultants: Nephrology Family Communication:  Not at bedside  Status is: Inpatient Dispo:  Patient From:    Planned Disposition: [place]  Expected discharge date: 07/17/2020  Medically stable for discharge:   Medically stable      Infusions:  . sodium chloride    . sodium chloride    . sodium chloride      Scheduled Meds: . apixaban  5 mg Oral BID  . aspirin  81 mg Oral Daily  . atorvastatin  10 mg Oral Q M,W,F  . Chlorhexidine Gluconate Cloth  6 each Topical Q0600  . darbepoetin (ARANESP) injection - DIALYSIS  60 mcg Intravenous Q Tue-HD  . doxercalciferol  2 mcg  Intravenous Q T,Th,Sa-HD  . feeding supplement (NEPRO CARB STEADY)  237 mL Oral TID WC  . insulin aspart  0-6 Units Subcutaneous TID WC  . insulin glargine  5 Units Subcutaneous Daily  . isosorbide mononitrate  15 mg Oral Daily  . lipase/protease/amylase  12,000 Units Oral TID AC  . magnesium chloride  2 tablet Oral BID  .  metoprolol succinate  50 mg Oral Daily  . pantoprazole  80 mg Oral Daily  . predniSONE  5 mg Oral Q breakfast  . sertraline  50 mg Oral QHS  . sevelamer carbonate  800 mg Oral TID WC  . sodium chloride flush  3 mL Intravenous Q12H  . tacrolimus  2 mg Oral BID  . tamsulosin  0.4 mg Oral QPC supper    Antimicrobials: Anti-infectives (From admission, onward)   Start     Dose/Rate Route Frequency Ordered Stop   06/24/20 1319  Vancomycin (VANCOCIN) 750-5 MG/150ML-% IVPB       Note to Pharmacy: Judieth Keens  : cabinet override      06/24/20 1319 06/24/20 1323   06/22/20 1200  vancomycin (VANCOCIN) IVPB 750 mg/150 ml premix        750 mg 150 mL/hr over 60 Minutes Intravenous Every T-Th-Sa (Hemodialysis) 06/21/20 1407 06/27/20 0958   06/21/20 1200  vancomycin (VANCOCIN) IVPB 750 mg/150 ml premix  Status:  Discontinued        750 mg 150 mL/hr over 60 Minutes Intravenous Every M-W-F (Hemodialysis) 06/20/20 0904 06/21/20 1407   06/20/20 1200  vancomycin (VANCOCIN) IVPB 750 mg/150 ml premix  Status:  Discontinued        750 mg 150 mL/hr over 60 Minutes Intravenous Every T-Th-Sa (Hemodialysis) 06/18/20 0819 06/19/20 0921   06/19/20 1200  vancomycin (VANCOCIN) IVPB 750 mg/150 ml premix        750 mg 150 mL/hr over 60 Minutes Intravenous Once in dialysis 06/19/20 0921 06/19/20 1657   06/17/20 1200  vancomycin (VANCOCIN) IVPB 500 mg/100 ml premix        500 mg 100 mL/hr over 60 Minutes Intravenous  Once 06/17/20 0811 06/17/20 1333   06/16/20 1200  vancomycin (VANCOCIN) IVPB 750 mg/150 ml premix        750 mg 150 mL/hr over 60 Minutes Intravenous  Once 06/15/20 1838 06/16/20 1626   06/13/20 1200  vancomycin (VANCOREADY) IVPB 750 mg/150 mL  Status:  Discontinued        750 mg 150 mL/hr over 60 Minutes Intravenous Every T-Th-Sa (Hemodialysis) 06/12/20 1202 06/17/20 0811   06/12/20 1245  vancomycin (VANCOREADY) IVPB 1750 mg/350 mL        1,750 mg 175 mL/hr over 120 Minutes Intravenous  Once  06/12/20 1158 06/12/20 1552   06/12/20 1000  remdesivir 100 mg in sodium chloride 0.9 % 100 mL IVPB       "Followed by" Linked Group Details   100 mg 200 mL/hr over 30 Minutes Intravenous Daily 06/11/20 1719 06/15/20 0902   06/11/20 2200  remdesivir 200 mg in sodium chloride 0.9% 250 mL IVPB       "Followed by" Linked Group Details   200 mg 580 mL/hr over 30 Minutes Intravenous Once 06/11/20 1719 06/12/20 0430      PRN meds: sodium chloride, sodium chloride, sodium chloride, acetaminophen, alteplase, chlorpheniramine-HYDROcodone, cyclobenzaprine, guaiFENesin-dextromethorphan, heparin, Ipratropium-Albuterol, lidocaine (PF), lidocaine-prilocaine, pentafluoroprop-tetrafluoroeth, polyethylene glycol, sodium chloride flush, traMADol   Objective: Vitals:   07/14/20 0425 07/14/20 0940  BP: 116/79 113/76  Pulse: (!) 110 94  Resp:  16  Temp: 97.7 F (36.5 C) 97.7 F (36.5 C)  SpO2: 97% 97%    Intake/Output Summary (Last 24 hours) at 07/14/2020 1414 Last data filed at 07/14/2020 1300 Gross per 24 hour  Intake 690 ml  Output 50 ml  Net 640 ml   Filed Weights   07/13/20 0648 07/13/20 1021 07/13/20 2118  Weight: 65.2 kg 64 kg 62.4 kg   Weight change: -1.2 kg Body mass index is 18.66 kg/m.   Physical Exam: General exam: Pleasant elderly Caucasian male.  Not in distress Skin: No rashes, lesions or ulcers. HEENT: Atraumatic, normocephalic, no obvious bleeding Lungs: Clear to auscultation bilaterally CVS: Regular rate and rhythm, no murmur GI/Abd soft, nontender, nondistended, bowel sound present CNS: Alert, awake, oriented x3 Psychiatry: Mood appropriate Extremities: Bilateral amputation status  Data Review: I have personally reviewed the laboratory data and studies available.  Recent Labs  Lab 07/08/20 1431 07/11/20 2229 07/13/20 0624  WBC 5.4 6.0 6.2  HGB 8.6* 8.6* 8.5*  HCT 28.7* 28.6* 27.0*  MCV 92.3 92.3 92.2  PLT 144* 264 271   Recent Labs  Lab 07/08/20 1431  07/11/20 2229 07/13/20 0624  NA 136 134* 132*  K 4.4 4.5 3.9  CL 97* 99 97*  CO2 25 21* 22  GLUCOSE 267* 98 201*  BUN 80* 106* 77*  CREATININE 4.83* 5.61* 4.50*  CALCIUM 7.9* 8.1* 8.3*  PHOS 5.1* 5.9* 5.2*    F/u labs Unresulted Labs (From admission, onward)         None      Signed, Terrilee Croak, MD [facility] 07/14/2020

## 2020-07-15 LAB — CBC
HCT: 29.1 % — ABNORMAL LOW (ref 39.0–52.0)
Hemoglobin: 8.7 g/dL — ABNORMAL LOW (ref 13.0–17.0)
MCH: 27.9 pg (ref 26.0–34.0)
MCHC: 29.9 g/dL — ABNORMAL LOW (ref 30.0–36.0)
MCV: 93.3 fL (ref 80.0–100.0)
Platelets: 360 10*3/uL (ref 150–400)
RBC: 3.12 MIL/uL — ABNORMAL LOW (ref 4.22–5.81)
RDW: 17.2 % — ABNORMAL HIGH (ref 11.5–15.5)
WBC: 7.7 10*3/uL (ref 4.0–10.5)
nRBC: 0 % (ref 0.0–0.2)

## 2020-07-15 LAB — GLUCOSE, CAPILLARY
Glucose-Capillary: 187 mg/dL — ABNORMAL HIGH (ref 70–99)
Glucose-Capillary: 189 mg/dL — ABNORMAL HIGH (ref 70–99)
Glucose-Capillary: 319 mg/dL — ABNORMAL HIGH (ref 70–99)
Glucose-Capillary: 258 mg/dL — ABNORMAL HIGH (ref 70–99)

## 2020-07-15 LAB — RENAL FUNCTION PANEL
Albumin: 1.7 g/dL — ABNORMAL LOW (ref 3.5–5.0)
Anion gap: 12 (ref 5–15)
BUN: 78 mg/dL — ABNORMAL HIGH (ref 8–23)
CO2: 22 mmol/L (ref 22–32)
Calcium: 8.3 mg/dL — ABNORMAL LOW (ref 8.9–10.3)
Chloride: 100 mmol/L (ref 98–111)
Creatinine, Ser: 4.63 mg/dL — ABNORMAL HIGH (ref 0.61–1.24)
GFR, Estimated: 13 mL/min — ABNORMAL LOW (ref >60)
Glucose, Bld: 204 mg/dL — ABNORMAL HIGH (ref 70–99)
Phosphorus: 5.2 mg/dL — ABNORMAL HIGH (ref 2.5–4.6)
Potassium: 4.2 mmol/L (ref 3.5–5.1)
Sodium: 134 mmol/L — ABNORMAL LOW (ref 135–145)

## 2020-07-15 MED ORDER — DOXERCALCIFEROL 4 MCG/2ML IV SOLN
INTRAVENOUS | Status: AC
  Filled 2020-07-15: qty 2

## 2020-07-15 MED ORDER — PROSOURCE PLUS PO LIQD
30.0000 mL | Freq: Two times a day (BID) | ORAL | Status: DC
  Administered 2020-07-15 – 2020-07-20 (×11): 30 mL via ORAL
  Filled 2020-07-15 (×10): qty 30

## 2020-07-15 MED ORDER — DARBEPOETIN ALFA 150 MCG/0.3ML IJ SOSY
150.0000 ug | PREFILLED_SYRINGE | INTRAMUSCULAR | Status: DC
  Filled 2020-07-15: qty 0.3

## 2020-07-15 NOTE — Progress Notes (Signed)
PROGRESS NOTE  Paul [name]  DOB: Jul 18, 1955  PCP: [name], MD MWU:[id number]  DOA: 06/11/2020  LOS: 34 days   Chief Complaint  Patient presents with  . Weakness  . Shortness of Breath  . Tachycardia   Brief narrative: Paul [name] is a 65 y.o. male with PMH significant for atrial fibrillation on Eliquis, ESRD with failed renal transplant, on dialysis, TTS, diabetes mellitus type 2, chronic diastolic heart failure. Patient presented to the ED on 06/11/2020 with complaints of dyspnea in setting of missed dialysis.   During the course of hospitalization, he was treated for Covid pneumonia, Enterococcus bacteremia.   2D echo showed severely reduced EF.   At this time, patient is pending placement to SNF.  Subjective: Patient was seen and examined this morning in hemodialysis. Pleasant.  Not in distress. Vitals this morning show tachycardia and low blood pressure.  Assessment/Plan: Acute respiratory failure with hypoxemia -secondary to fluid overload, resolved with hemodialysis.  Sepsis due to bacteremia/Enterococcus -patient had Enterococcus bacteremia likely urinary source.   -2D echo showed no evidence of vegetation.   -Previous hospitalist Dr. Olevia Bowens discussed with ID who recommended 2 weeks of IV antibiotics.  Antibiotics course has been completed in the hospital.    Atrial flutter Essential hypertension -This morning, at dialysis, patient is tachycardic up to 120s and blood pressure is low in 90s.  Per RN, he has not yet gotten his morning dose of metoprolol 37.5 mg twice daily.  I would give metoprolol this morning after dialysis and continue to monitor blood pressure and heart rate.  Imdur remains on hold. -Continue to monitor in telemetry -Continue Eliquis for anticoagulation  Acute metabolic encephalopathy -Secondary to sepsis, resolved   COVID-19 pneumonia -Treated with remdesivir 5 days, steroids weaned off.   -Patient has completed isolation. Symptoms  have resolved.  ESRD-HD-TTS Chronic metabolic acidosis Failed renal transplant. -Continue immunosuppression with tacrolimus, prednisone.   -Continue Sevelamer, sodium bicarbonate -Nephrology following.  Diabetes mellitus type 2 Hypoglycemia --A1c 7.5 on 1/24. -Currently on 5 units Lantus daily along with sliding scale insulin.  Continue same.  Blood sugar reading fluctuating. -Continue sliding scale insulin with Accu-Cheks. Recent Labs  Lab 07/14/20 0635 07/14/20 1138 07/14/20 1630 07/14/20 2110 07/15/20 0654  GLUCAP 91 171* 208* 252* 187*   Hyperlipidemia -continue Lipitor  Depression -continue Zoloft  Mobility: Bilateral above-knee amputation status Code Status:   Code Status: Full Code  Nutritional status: Body mass index is 19.38 kg/m.     Diet Order            Diet renal with fluid restriction Fluid restriction: 1200 mL Fluid; Room service appropriate? Yes; Fluid consistency: Thin  Diet effective now                 DVT prophylaxis:  apixaban (ELIQUIS) tablet 5 mg   Antimicrobials:  None Fluid: None Consultants: Nephrology Family Communication:  Not at bedside  Status is: Inpatient Dispo:  Patient From:    Planned Disposition: [place]  Expected discharge date: 07/21/2020  Medically stable for discharge:   Medically stable      Infusions:  . sodium chloride    . sodium chloride    . sodium chloride      Scheduled Meds: . (feeding supplement) PROSource Plus  30 mL Oral BID BM  . apixaban  5 mg Oral BID  . aspirin  81 mg Oral Daily  . atorvastatin  10 mg Oral Q M,W,F  . Chlorhexidine Gluconate  Cloth  6 each Topical [id number]  . [START ON 07/18/2020] darbepoetin (ARANESP) injection - DIALYSIS  150 mcg Intravenous Q Tue-HD  . doxercalciferol  2 mcg Intravenous Q T,Th,Sa-HD  . feeding supplement (NEPRO CARB STEADY)  237 mL Oral TID WC  . insulin aspart  0-6 Units Subcutaneous TID WC  . insulin glargine  5 Units Subcutaneous Daily   . isosorbide mononitrate  15 mg Oral Daily  . lipase/protease/amylase  12,000 Units Oral TID AC  . magnesium chloride  2 tablet Oral BID  . metoprolol succinate  50 mg Oral Daily  . pantoprazole  80 mg Oral Daily  . predniSONE  5 mg Oral Q breakfast  . sertraline  50 mg Oral QHS  . sevelamer carbonate  800 mg Oral TID WC  . sodium chloride flush  3 mL Intravenous Q12H  . tacrolimus  2 mg Oral BID  . tamsulosin  0.4 mg Oral QPC supper    Antimicrobials: Anti-infectives (From admission, onward)   Start     Dose/Rate Route Frequency Ordered Stop   06/24/20 1319  Vancomycin (VANCOCIN) 750-5 MG/150ML-% IVPB       Note to Pharmacy: Judieth Keens  : cabinet override      06/24/20 1319 06/24/20 1323   06/22/20 1200  vancomycin (VANCOCIN) IVPB 750 mg/150 ml premix        750 mg 150 mL/hr over 60 Minutes Intravenous Every T-Th-Sa (Hemodialysis) 06/21/20 1407 06/27/20 0958   06/21/20 1200  vancomycin (VANCOCIN) IVPB 750 mg/150 ml premix  Status:  Discontinued        750 mg 150 mL/hr over 60 Minutes Intravenous Every M-W-F (Hemodialysis) 06/20/20 0904 06/21/20 1407   06/20/20 1200  vancomycin (VANCOCIN) IVPB 750 mg/150 ml premix  Status:  Discontinued        750 mg 150 mL/hr over 60 Minutes Intravenous Every T-Th-Sa (Hemodialysis) 06/18/20 0819 06/19/20 0921   06/19/20 1200  vancomycin (VANCOCIN) IVPB 750 mg/150 ml premix        750 mg 150 mL/hr over 60 Minutes Intravenous Once in dialysis 06/19/20 0921 06/19/20 1657   06/17/20 1200  vancomycin (VANCOCIN) IVPB 500 mg/100 ml premix        500 mg 100 mL/hr over 60 Minutes Intravenous  Once 06/17/20 0811 06/17/20 1333   06/16/20 1200  vancomycin (VANCOCIN) IVPB 750 mg/150 ml premix        750 mg 150 mL/hr over 60 Minutes Intravenous  Once 06/15/20 1838 06/16/20 1626   06/13/20 1200  vancomycin (VANCOREADY) IVPB 750 mg/150 mL  Status:  Discontinued        750 mg 150 mL/hr over 60 Minutes Intravenous Every T-Th-Sa (Hemodialysis) 06/12/20  1202 06/17/20 0811   06/12/20 1245  vancomycin (VANCOREADY) IVPB 1750 mg/350 mL        1,750 mg 175 mL/hr over 120 Minutes Intravenous  Once 06/12/20 1158 06/12/20 1552   06/12/20 1000  remdesivir 100 mg in sodium chloride 0.9 % 100 mL IVPB       "Followed by" Linked Group Details   100 mg 200 mL/hr over 30 Minutes Intravenous Daily 06/11/20 1719 06/15/20 0902   06/11/20 2200  remdesivir 200 mg in sodium chloride 0.9% 250 mL IVPB       "Followed by" Linked Group Details   200 mg 580 mL/hr over 30 Minutes Intravenous Once 06/11/20 1719 06/12/20 0430      PRN meds: sodium chloride, sodium chloride, sodium chloride, acetaminophen, alteplase, chlorpheniramine-HYDROcodone, cyclobenzaprine, guaiFENesin-dextromethorphan, heparin, Ipratropium-Albuterol, lidocaine (  PF), lidocaine-prilocaine, pentafluoroprop-tetrafluoroeth, polyethylene glycol, sodium chloride flush, traMADol   Objective: Vitals:   07/15/20 1030 07/15/20 1105  BP: (!) 86/50 (!) 94/58  Pulse: (!) 110 (!) 118  Resp: (!) 23   Temp:  98 F (36.7 C)  SpO2:  94%    Intake/Output Summary (Last 24 hours) at 07/15/2020 1121 Last data filed at 07/15/2020 0700 Gross per 24 hour  Intake 1053 ml  Output 126 ml  Net 927 ml   Filed Weights   07/13/20 2118 07/14/20 2109 07/15/20 0755  Weight: 62.4 kg 62.4 kg 64.8 kg   Weight change: -1.599 kg Body mass index is 19.38 kg/m.   Physical Exam: General exam: Pleasant elderly Caucasian male.  Not in distress Skin: No rashes, lesions or ulcers. HEENT: Atraumatic, normocephalic, no obvious bleeding Lungs: Clear to auscultation bilaterally CVS: Tachycardic this morning. GI/Abd soft, nontender, nondistended, bowel sound present CNS: Alert, awake, oriented x3 Psychiatry: Mood appropriate Extremities: Bilateral amputation status  Data Review: I have personally reviewed the laboratory data and studies available.  Recent Labs  Lab 07/08/20 1431 07/11/20 2229 07/13/20 0624  07/15/20 0804  WBC 5.4 6.0 6.2 7.7  HGB 8.6* 8.6* 8.5* 8.7*  HCT 28.7* 28.6* 27.0* 29.1*  MCV 92.3 92.3 92.2 93.3  PLT 144* 264 271 360   Recent Labs  Lab 07/08/20 1431 07/11/20 2229 07/13/20 0624 07/15/20 0805  NA 136 134* 132* 134*  K 4.4 4.5 3.9 4.2  CL 97* 99 97* 100  CO2 25 21* 22 22  GLUCOSE 267* 98 201* 204*  BUN 80* 106* 77* 78*  CREATININE 4.83* 5.61* 4.50* 4.63*  CALCIUM 7.9* 8.1* 8.3* 8.3*  PHOS 5.1* 5.9* 5.2* 5.2*    F/u labs Unresulted Labs (From admission, onward)         None      Signed, Terrilee Croak, MD [facility] 07/15/2020

## 2020-07-15 NOTE — Progress Notes (Signed)
   07/15/20 1105  Vitals  Temp 98 F (36.7 C)  BP (!) 94/58  BP Location Left Arm  BP Method Automatic  Patient Position (if appropriate) Lying  Pulse Rate (!) 118  Pulse Rate Source Monitor  Oxygen Therapy  SpO2 94 %  O2 Device Room Air  Post-Hemodialysis Assessment  Rinseback Volume (mL) 250 mL  KECN 277 V  Dialyzer Clearance Lightly streaked  Duration of HD Treatment -hour(s) 3 hour(s)  Hemodialysis Intake (mL) 500 mL  UF Total -Machine (mL) 1218 mL  Net UF (mL) 718 mL  Tolerated HD Treatment Yes  Post-Hemodialysis Comments tx complete  AVG/AVF Arterial Site Held (minutes) 5 minutes  AVG/AVF Venous Site Held (minutes) 5 minutes  Fistula / Graft Right Forearm Arteriovenous fistula  Placement Date/Time: 05/22/12 0831   Placed prior to admission: No  Orientation: Right  Access Location: Forearm  Access Type: Arteriovenous fistula  Expiration Date: 03/21/15  Site Condition No complications  Fistula / Graft Assessment Present;Thrill;Bruit  Status Deaccessed  Drainage Description None  Tx complete-pt stable. Pt hypotensive throughout tx, unable to meet UF goal. Asymptomatic, Katie,PA notified while on unit.

## 2020-07-15 NOTE — Progress Notes (Signed)
[facility] Progress Note   Subjective:  Seen on HD - 1.5L UFG and tolerating. No CP, dyspnea, or edema.  Objective Vitals:   07/14/20 1735 07/14/20 2109 07/15/20 0524 07/15/20 0755  BP: 111/71 109/66 126/71 111/88  Pulse: 90 (!) 107 92 (!) 122  Resp: 18 17 17  (!) 22  Temp: 98.8 F (37.1 C) 99.3 F (37.4 C) 97.8 F (36.6 C) 98.1 F (36.7 C)  TempSrc: Oral Oral  Oral  SpO2: 97% 96% 96% 97%  Weight:  62.4 kg    Height:       Physical Exam General: Well appearing man, NAD. Room air. Heart: Irregularly irregular, no murmur Lungs: CTA anteriorly, no wheezing or rales Abdomen: soft Extremities: B BKA without edema Dialysis Access: R AVF + thrill  Additional Objective Labs: Basic Metabolic Panel: Recent Labs  Lab 07/08/20 1431 07/11/20 2229 07/13/20 0624  NA 136 134* 132*  K 4.4 4.5 3.9  CL 97* 99 97*  CO2 25 21* 22  GLUCOSE 267* 98 201*  BUN 80* 106* 77*  CREATININE 4.83* 5.61* 4.50*  CALCIUM 7.9* 8.1* 8.3*  PHOS 5.1* 5.9* 5.2*   Liver Function Tests: Recent Labs  Lab 07/08/20 1431 07/11/20 2229 07/13/20 0624  ALBUMIN 1.8* 1.7* 1.7*   CBC: Recent Labs  Lab 07/08/20 1431 07/11/20 2229 07/13/20 0624 07/15/20 0804  WBC 5.4 6.0 6.2 7.7  HGB 8.6* 8.6* 8.5* 8.7*  HCT 28.7* 28.6* 27.0* 29.1*  MCV 92.3 92.3 92.2 93.3  PLT 144* 264 271 360   Medications: . sodium chloride    . sodium chloride    . sodium chloride     . apixaban  5 mg Oral BID  . aspirin  81 mg Oral Daily  . atorvastatin  10 mg Oral Q M,W,F  . Chlorhexidine Gluconate Cloth  6 each Topical Q0600  . darbepoetin (ARANESP) injection - DIALYSIS  60 mcg Intravenous Q Tue-HD  . doxercalciferol  2 mcg Intravenous Q T,Th,Sa-HD  . feeding supplement (NEPRO CARB STEADY)  237 mL Oral TID WC  . insulin aspart  0-6 Units Subcutaneous TID WC  . insulin glargine  5 Units Subcutaneous Daily  . isosorbide mononitrate  15 mg Oral Daily  . lipase/protease/amylase  12,000 Units Oral TID  AC  . magnesium chloride  2 tablet Oral BID  . metoprolol succinate  50 mg Oral Daily  . pantoprazole  80 mg Oral Daily  . predniSONE  5 mg Oral Q breakfast  . sertraline  50 mg Oral QHS  . sevelamer carbonate  800 mg Oral TID WC  . sodium chloride flush  3 mL Intravenous Q12H  . tacrolimus  2 mg Oral BID  . tamsulosin  0.4 mg Oral QPC supper    Dialysis Orders: TTS at [facility] 3:30hr, 400/800, EDW 66kg, 3K/2.25Ca, AVF, no heparin  - Hectoral 52mcg Iv q HD - Mircera 263mcg IV q 2 weeks (last given 1/13 - Hgb 10.9 at the time)  Assessment/Plan: 1. Sepsis 2/2 Enterococcus bacteremia (resolved): Completed antibiotics course. Echo without vegetations.  2. COVID-19/PNA (resolved): Tested (+) 06/11/20, off isolation. 3.  ESRD: Continue HD per TTS schedule - HD today. Remains on low dose IS for prior kidney transplant. 4. Anemiaof CKD: Hgb 8.7 - continueAranesp q Tuesday, will ^ next dose. 5. Secondary hyperparathyroidism- Ca/Phos at goal. Continue VDRA and binders. 6.HTN/volume: BP controlled on current regimen (metoprolol, Imdur) - no significant volume excess. 7. Nutrition- Albv remains very low, continue protein supplements. 8. Tachy/brady  syndrome- H/o AFib/RVR. - On metoprolol, Eliquis. Not symptomatic. Monitor.  9. Dispo:Per Hospitalist note, anticipated discharge to SNF soon.  Veneta Penton, PA-C 07/15/2020, 8:34 AM  [employer]

## 2020-07-16 LAB — GLUCOSE, CAPILLARY
Glucose-Capillary: 159 mg/dL — ABNORMAL HIGH (ref 70–99)
Glucose-Capillary: 211 mg/dL — ABNORMAL HIGH (ref 70–99)
Glucose-Capillary: 180 mg/dL — ABNORMAL HIGH (ref 70–99)
Glucose-Capillary: 212 mg/dL — ABNORMAL HIGH (ref 70–99)

## 2020-07-16 MED ORDER — INSULIN GLARGINE 100 UNIT/ML ~~LOC~~ SOLN
8.0000 [IU] | Freq: Every day | SUBCUTANEOUS | Status: DC
  Administered 2020-07-17 – 2020-07-18 (×2): 8 [IU] via SUBCUTANEOUS
  Filled 2020-07-16 (×2): qty 0.08

## 2020-07-16 NOTE — Progress Notes (Signed)
Received call from tele stating that pt has been normal sinus and that heart rate will jump up to160s and not sustain. Pt does have a history of AFIB. RN went to assess pt and he was resting in bed comfortably. Pt stated that he felt fine and was not having any chest discomfort or SOB. HR at this time is 31, RN will continue to monitor.   Eleanora Neighbor, RN

## 2020-07-16 NOTE — Progress Notes (Signed)
[name] Progress Note   Subjective:  Seen in room - no overnight complaints, but RN notes indicate several spikes of asymptomatic tachycardic. Denies CP, dyspnea.  Objective Vitals:   07/15/20 1740 07/15/20 2014 07/16/20 0500 07/16/20 0506  BP: 127/71 128/90  121/61  Pulse: 94 (!) 49  (!) 58  Resp: 17 16  16   Temp: 98.1 F (36.7 C) 98.6 F (37 C)  98.4 F (36.9 C)  TempSrc:      SpO2: 99% 96%  96%  Weight:  64.5 kg 64.5 kg   Height:       Physical Exam General: Well appearing man, NAD. Room air. Heart: Irregularly irregular, no murmur Lungs: CTA anteriorly, no wheezing or rales Abdomen: soft Extremities: B BKA without edema Dialysis Access: R AVF + thrill  Additional Objective Labs: Basic Metabolic Panel: Recent Labs  Lab 07/11/20 2229 07/13/20 0624 07/15/20 0805  NA 134* 132* 134*  K 4.5 3.9 4.2  CL 99 97* 100  CO2 21* 22 22  GLUCOSE 98 201* 204*  BUN 106* 77* 78*  CREATININE 5.61* 4.50* 4.63*  CALCIUM 8.1* 8.3* 8.3*  PHOS 5.9* 5.2* 5.2*   Liver Function Tests: Recent Labs  Lab 07/11/20 2229 07/13/20 0624 07/15/20 0805  ALBUMIN 1.7* 1.7* 1.7*   CBC: Recent Labs  Lab 07/11/20 2229 07/13/20 0624 07/15/20 0804  WBC 6.0 6.2 7.7  HGB 8.6* 8.5* 8.7*  HCT 28.6* 27.0* 29.1*  MCV 92.3 92.2 93.3  PLT 264 271 360   Medications: . sodium chloride     . (feeding supplement) PROSource Plus  30 mL Oral BID BM  . apixaban  5 mg Oral BID  . aspirin  81 mg Oral Daily  . atorvastatin  10 mg Oral Q M,W,F  . Chlorhexidine Gluconate Cloth  6 each Topical Q0600  . [START ON 07/18/2020] darbepoetin (ARANESP) injection - DIALYSIS  150 mcg Intravenous Q Tue-HD  . doxercalciferol  2 mcg Intravenous Q T,Th,Sa-HD  . feeding supplement (NEPRO CARB STEADY)  237 mL Oral TID WC  . insulin aspart  0-6 Units Subcutaneous TID WC  . insulin glargine  5 Units Subcutaneous Daily  . isosorbide mononitrate  15 mg Oral Daily  . lipase/protease/amylase  12,000  Units Oral TID AC  . magnesium chloride  2 tablet Oral BID  . metoprolol succinate  50 mg Oral Daily  . pantoprazole  80 mg Oral Daily  . predniSONE  5 mg Oral Q breakfast  . sertraline  50 mg Oral QHS  . sevelamer carbonate  800 mg Oral TID WC  . sodium chloride flush  3 mL Intravenous Q12H  . tacrolimus  2 mg Oral BID  . tamsulosin  0.4 mg Oral QPC supper    Dialysis Orders: TTS at [facility] 3:30hr, 400/800, EDW 66kg, 3K/2.25Ca, AVF, no heparin  - Hectoral 48mcg Iv q HD - Mircera 250mcg IV q 2 weeks (last given 1/13 - Hgb 10.9 at the time)  Assessment/Plan: 1. Sepsis 2/2 Enterococcus bacteremia (resolved): Completed antibiotics course. Echo without vegetations.  2. COVID-19/PNA (resolved): Tested (+) 06/11/20, off isolation. 3.  ESRD: Continue HD per TTS schedule - next 3/1. Remains on low dose IS for prior kidney transplant. 4. Anemiaof CKD: Hgb 8.7 - continueAranesp q Tuesday, increased next dose. 5. Secondary hyperparathyroidism- Ca/Phos at goal. Continue VDRA and binders. 6.HTN/volume: BP controlled on current regimen (metoprolol, Imdur) - no significant volume excess. 7. Nutrition- Alb remains very low, continue protein supplements. 8. Tachy/brady syndrome- Hx  AFib/RVR. - On metoprolol, Eliquis. Not symptomatic. Monitor.  9. Dispo:Per Hospitalist note, anticipated discharge to SNF soon.  Veneta Penton, PA-C 07/16/2020, 9:09 AM  [employer]

## 2020-07-16 NOTE — Progress Notes (Signed)
Pt's HR is continuing to jump up into the 140-160s and then it goes back down to 60-70s. RN messaged MD on call to make aware. Pt is still asymptomatic.   Eleanora Neighbor, RN

## 2020-07-16 NOTE — Progress Notes (Signed)
PROGRESS NOTE  Paul [name]  DOB: 1955-09-12  PCP: [name], MD YQI:[id number]  DOA: 06/11/2020  LOS: 35 days   Chief Complaint  Patient presents with  . Weakness  . Shortness of Breath  . Tachycardia   Brief narrative: Paul [name] is a 65 y.o. male with PMH significant for atrial fibrillation on Eliquis, ESRD with failed renal transplant, on dialysis, TTS, diabetes mellitus type 2, chronic diastolic heart failure. Patient presented to the ED on 06/11/2020 with complaints of dyspnea in setting of missed dialysis.   During the course of hospitalization, he was treated for Covid pneumonia, Enterococcus bacteremia.   2D echo showed severely reduced EF.   At this time, patient is pending placement to SNF.  Subjective: Patient was seen and examined this morning. Not in distress.  No new symptoms.  Heart rate and blood pressure controlled.  Assessment/Plan: Acute respiratory failure with hypoxemia -secondary to fluid overload, resolved with hemodialysis.  Sepsis due to bacteremia/Enterococcus -patient had Enterococcus bacteremia likely urinary source.   -2D echo showed no evidence of vegetation.   -Previous hospitalist Dr. Olevia Bowens discussed with ID who recommended 2 weeks of IV antibiotics.  Antibiotics course has been completed in the hospital.    Atrial flutter Essential hypertension -Post dialysis yesterday, his blood pressure was low and heart rate was elevated which later improved.  This morning, blood pressure seems to be running low but heart rate is also controlled.   -Continue metoprolol 37.5 mg twice daily. Imdur on hold.   -Continue to monitor in telemetry. -Continue Eliquis for anticoagulation  Acute metabolic encephalopathy -Secondary to sepsis, resolved   COVID-19 pneumonia -Treated with remdesivir 5 days, steroids weaned off.   -Patient has completed isolation. Symptoms have resolved.  ESRD-HD-TTS Chronic metabolic acidosis Failed renal  transplant. -Continue immunosuppression with tacrolimus, prednisone.   -Continue Sevelamer, sodium bicarbonate -Nephrology following.  Diabetes mellitus type 2 Hypoglycemia --A1c 7.5 on 1/24. -Currently on 5 units Lantus daily along with sliding scale insulin.  Blood sugar level remains elevated to over 200 this morning.  Will increase Lantus to 8 units daily.   -Continue sliding scale insulin with Accu-Cheks. Recent Labs  Lab 07/15/20 1148 07/15/20 1644 07/15/20 2017 07/16/20 0637 07/16/20 1154  GLUCAP 189* 319* 258* 159* 211*   Hyperlipidemia -continue Lipitor  Depression -continue Zoloft  Mobility: Bilateral above-knee amputation status Code Status:   Code Status: Full Code  Nutritional status: Body mass index is 19.29 kg/m.     Diet Order            Diet renal with fluid restriction Fluid restriction: 1200 mL Fluid; Room service appropriate? Yes; Fluid consistency: Thin  Diet effective now                 DVT prophylaxis:  apixaban (ELIQUIS) tablet 5 mg   Antimicrobials:  None Fluid: None Consultants: Nephrology Family Communication:  Not at bedside  Status is: Inpatient Dispo:  Patient From:  Home  Planned Disposition: [place]  Expected discharge date: 07/21/2020  Medically stable for discharge:   Medically stable      Infusions:  . sodium chloride      Scheduled Meds: . (feeding supplement) PROSource Plus  30 mL Oral BID BM  . apixaban  5 mg Oral BID  . aspirin  81 mg Oral Daily  . atorvastatin  10 mg Oral Q M,W,F  . Chlorhexidine Gluconate Cloth  6 each Topical Q0600  . [START ON 07/18/2020] darbepoetin (ARANESP)  injection - DIALYSIS  150 mcg Intravenous Q Tue-HD  . doxercalciferol  2 mcg Intravenous Q T,Th,Sa-HD  . feeding supplement (NEPRO CARB STEADY)  237 mL Oral TID WC  . insulin aspart  0-6 Units Subcutaneous TID WC  . insulin glargine  5 Units Subcutaneous Daily  . isosorbide mononitrate  15 mg Oral Daily  .  lipase/protease/amylase  12,000 Units Oral TID AC  . magnesium chloride  2 tablet Oral BID  . metoprolol succinate  50 mg Oral Daily  . pantoprazole  80 mg Oral Daily  . predniSONE  5 mg Oral Q breakfast  . sertraline  50 mg Oral QHS  . sevelamer carbonate  800 mg Oral TID WC  . sodium chloride flush  3 mL Intravenous Q12H  . tacrolimus  2 mg Oral BID  . tamsulosin  0.4 mg Oral QPC supper    Antimicrobials: Anti-infectives (From admission, onward)   Start     Dose/Rate Route Frequency Ordered Stop   06/24/20 1319  Vancomycin (VANCOCIN) 750-5 MG/150ML-% IVPB       Note to Pharmacy: Judieth Keens  : cabinet override      06/24/20 1319 06/24/20 1323   06/22/20 1200  vancomycin (VANCOCIN) IVPB 750 mg/150 ml premix        750 mg 150 mL/hr over 60 Minutes Intravenous Every T-Th-Sa (Hemodialysis) 06/21/20 1407 06/27/20 0958   06/21/20 1200  vancomycin (VANCOCIN) IVPB 750 mg/150 ml premix  Status:  Discontinued        750 mg 150 mL/hr over 60 Minutes Intravenous Every M-W-F (Hemodialysis) 06/20/20 0904 06/21/20 1407   06/20/20 1200  vancomycin (VANCOCIN) IVPB 750 mg/150 ml premix  Status:  Discontinued        750 mg 150 mL/hr over 60 Minutes Intravenous Every T-Th-Sa (Hemodialysis) 06/18/20 0819 06/19/20 0921   06/19/20 1200  vancomycin (VANCOCIN) IVPB 750 mg/150 ml premix        750 mg 150 mL/hr over 60 Minutes Intravenous Once in dialysis 06/19/20 0921 06/19/20 1657   06/17/20 1200  vancomycin (VANCOCIN) IVPB 500 mg/100 ml premix        500 mg 100 mL/hr over 60 Minutes Intravenous  Once 06/17/20 0811 06/17/20 1333   06/16/20 1200  vancomycin (VANCOCIN) IVPB 750 mg/150 ml premix        750 mg 150 mL/hr over 60 Minutes Intravenous  Once 06/15/20 1838 06/16/20 1626   06/13/20 1200  vancomycin (VANCOREADY) IVPB 750 mg/150 mL  Status:  Discontinued        750 mg 150 mL/hr over 60 Minutes Intravenous Every T-Th-Sa (Hemodialysis) 06/12/20 1202 06/17/20 0811   06/12/20 1245  vancomycin  (VANCOREADY) IVPB 1750 mg/350 mL        1,750 mg 175 mL/hr over 120 Minutes Intravenous  Once 06/12/20 1158 06/12/20 1552   06/12/20 1000  remdesivir 100 mg in sodium chloride 0.9 % 100 mL IVPB       "Followed by" Linked Group Details   100 mg 200 mL/hr over 30 Minutes Intravenous Daily 06/11/20 1719 06/15/20 0902   06/11/20 2200  remdesivir 200 mg in sodium chloride 0.9% 250 mL IVPB       "Followed by" Linked Group Details   200 mg 580 mL/hr over 30 Minutes Intravenous Once 06/11/20 1719 06/12/20 0430      PRN meds: sodium chloride, acetaminophen, chlorpheniramine-HYDROcodone, cyclobenzaprine, guaiFENesin-dextromethorphan, Ipratropium-Albuterol, polyethylene glycol, sodium chloride flush, traMADol   Objective: Vitals:   07/16/20 0955 07/16/20 1248  BP: (!) 86/55 [name])  93/56  Pulse: 63 62  Resp: 19 15  Temp: 98.2 F (36.8 C) 98.2 F (36.8 C)  SpO2: 94% 98%    Intake/Output Summary (Last 24 hours) at 07/16/2020 1311 Last data filed at 07/16/2020 0800 Gross per 24 hour  Intake 720 ml  Output 0 ml  Net 720 ml   Filed Weights   07/15/20 0755 07/15/20 2014 07/16/20 0500  Weight: 64.8 kg 64.5 kg 64.5 kg   Weight change: 2.399 kg Body mass index is 19.29 kg/m.   Physical Exam: General exam: Pleasant elderly Caucasian male. Not in distress.  Skin: No rashes, lesions or ulcers. HEENT: Atraumatic, normocephalic, no obvious bleeding Lungs: Clear to auscultation bilaterally CVS: Irregular rhythm, rate controlled. GI/Abd soft, nontender, nondistended, bowel sound present CNS: Alert, awake, oriented x3 Psychiatry: Mood appropriate Extremities: Bilateral amputation status  Data Review: I have personally reviewed the laboratory data and studies available.  Recent Labs  Lab 07/11/20 2229 07/13/20 0624 07/15/20 0804  WBC 6.0 6.2 7.7  HGB 8.6* 8.5* 8.7*  HCT 28.6* 27.0* 29.1*  MCV 92.3 92.2 93.3  PLT 264 271 360   Recent Labs  Lab 07/11/20 2229 07/13/20 0624  07/15/20 0805  NA 134* 132* 134*  K 4.5 3.9 4.2  CL 99 97* 100  CO2 21* 22 22  GLUCOSE 98 201* 204*  BUN 106* 77* 78*  CREATININE 5.61* 4.50* 4.63*  CALCIUM 8.1* 8.3* 8.3*  PHOS 5.9* 5.2* 5.2*    F/u labs Unresulted Labs (From admission, onward)         None      Signed, Terrilee Croak, MD [facility] 07/16/2020

## 2020-07-17 LAB — GLUCOSE, CAPILLARY
Glucose-Capillary: 159 mg/dL — ABNORMAL HIGH (ref 70–99)
Glucose-Capillary: 174 mg/dL — ABNORMAL HIGH (ref 70–99)
Glucose-Capillary: 188 mg/dL — ABNORMAL HIGH (ref 70–99)
Glucose-Capillary: 272 mg/dL — ABNORMAL HIGH (ref 70–99)
Glucose-Capillary: 294 mg/dL — ABNORMAL HIGH (ref 70–99)
Glucose-Capillary: 303 mg/dL — ABNORMAL HIGH (ref 70–99)

## 2020-07-17 MED ORDER — LIDOCAINE HCL (PF) 1 % IJ SOLN: 5.0000 mL | INTRAMUSCULAR | Status: DC | PRN

## 2020-07-17 MED ORDER — PENTAFLUOROPROP-TETRAFLUOROETH EX AERO: 1.0000 "application " | INHALATION_SPRAY | CUTANEOUS | Status: DC | PRN

## 2020-07-17 MED ORDER — SODIUM CHLORIDE 0.9 % IV SOLN: 100.0000 mL | INTRAVENOUS | Status: DC | PRN

## 2020-07-17 MED ORDER — ALTEPLASE 2 MG IJ SOLR: 2.0000 mg | Freq: Once | INTRAMUSCULAR | Status: DC | PRN

## 2020-07-17 MED ORDER — HEPARIN SODIUM (PORCINE) 1000 UNIT/ML DIALYSIS: 1000.0000 [IU] | INTRAMUSCULAR | Status: DC | PRN

## 2020-07-17 MED ORDER — LIDOCAINE-PRILOCAINE 2.5-2.5 % EX CREA: 1.0000 "application " | TOPICAL_CREAM | CUTANEOUS | Status: DC | PRN

## 2020-07-17 MED ORDER — TACROLIMUS 1 MG PO CAPS
1.0000 mg | ORAL_CAPSULE | Freq: Two times a day (BID) | ORAL | Status: DC
  Administered 2020-07-17 – 2020-07-20 (×6): 1 mg via ORAL
  Filled 2020-07-17 (×6): qty 1

## 2020-07-17 NOTE — Progress Notes (Signed)
Occupational Therapy Treatment Patient Details Name: ASAEL PANN MRN: [id number] DOB: 1956-02-28 Today's Date: 07/17/2020    History of present illness 65 yo male presenting to ED via EMS after missing dialysis and weakness with COVID-19 +. Off COVID precautions on 2/3. PMH including HTN, DM2, renal transplant, CKD st IV, calciphylaxis, A-Fib, bil. TTAs, bilateral BKA with prosthesis,and TIA.   OT comments  Pt seen in conjunction with PT to optimize pts activity tolerance and participation although pt limited by dizziness with mobility this session even though vitals WFL ( see vitals below). Pt continues to present with increased pain, decreased activity tolerance and impaired balance impacting pts ability to complete BADLs independently. Pt required MAX A +2 to elevate trunk into sitting from side lying, pt sat EOB with at least min guard assist with BUE supported ~ 10 mins. Deferred further functional mobility d/t dizziness. Pt would continue to benefit from skilled occupational therapy while admitted and after d/c to address the below listed limitations in order to improve overall functional mobility and facilitate independence with BADL participation. DC plan remains appropriate, will follow acutely per POC.   supine- 102/79 EOB- 121/74 EOB ~ 2 mins 100/78 EOB ~ 6 mins 114/72   supine 100/71   Follow Up Recommendations  SNF;Supervision/Assistance - 24 hour    Equipment Recommendations  None recommended by OT;Other (comment) (well equipped)    Recommendations for Other Services      Precautions / Restrictions Precautions Precautions: Fall;Other (comment) Precaution Comments: B BKA with prosthetics in room now, mod fall Restrictions Weight Bearing Restrictions: No       Mobility Bed Mobility Overal bed mobility: Needs Assistance Bed Mobility: Rolling;Sidelying to Sit;Sit to Sidelying Rolling: Modified independent (Device/Increase time) Sidelying to sit: +2 for physical  assistance;Max assist     Sit to sidelying: Min assist General bed mobility comments: Use of bed rails for rolling, Max A x2 for sidelying to sit to bring trunk upright    Transfers                 General transfer comment: Unable due to dizziness    Balance Overall balance assessment: Needs assistance Sitting-balance support: Feet unsupported;Single extremity supported Sitting balance-Leahy Scale: Poor Sitting balance - Comments: Needed min guard for donning prosthetics in sitting, reliant on UE support   Standing balance support: Bilateral upper extremity supported;During functional activity Standing balance-Leahy Scale: Zero Standing balance comment: Reliant on external assistance and B UE support                           ADL either performed or assessed with clinical judgement   ADL Overall ADL's : Needs assistance/impaired             Lower Body Bathing: Supervison/ safety;Set up;Bed level Lower Body Bathing Details (indicate cue type and reason): simulated via LB dressing tasks from supine     Lower Body Dressing: Supervision/safety;Set up;Bed level;Total assistance Lower Body Dressing Details (indicate cue type and reason): to don brief and shorts in bed, rolling R<>L with supervision to pull pants up to waist line, donned pts shrinkers this session with total A as ECS strategy   Toilet Transfer Details (indicate cue type and reason): unable to transfer this session d/t dizziness Toileting- Clothing Manipulation and Hygiene: Supervision/safety;Set up;Bed level Toileting - Clothing Manipulation Details (indicate cue type and reason): simulated via LB dressing tasks from supine     Functional mobility during ADLs:  Maximal assistance;+2 for physical assistance (bed mobility only) General ADL Comments: pt with limited mobility progression this session d/t reports of increased dizziness with mobility tasks.     Vision       Perception      Praxis      Cognition Arousal/Alertness: Awake/alert Behavior During Therapy: Flat affect Overall Cognitive Status: Impaired/Different from baseline Area of Impairment: Problem solving;Safety/judgement;Awareness;Attention;Following commands                   Current Attention Level: Sustained   Following Commands: Follows multi-step commands with increased time;Follows one step commands consistently Safety/Judgement: Decreased awareness of safety;Decreased awareness of deficits Awareness: Emergent Problem Solving: Slow processing;Requires verbal cues General Comments: pt continues to be very flat during session needing increased time to follow multistep commands. pt responds appropriately during session but can be slow to respond        Exercises     Shoulder Instructions       General Comments A-fib rhythm, HR 100-120s. SPO2 > 96% on 2L. supine- 102/79, EOB- 121/74,  EOB ~ 2 mins 100/78  EOB ~ 6 mins 114/72   supine 100/71    Pertinent Vitals/ Pain       Pain Assessment: Faces Faces Pain Scale: Hurts whole lot Pain Location: dizziness; R hip pain Pain Descriptors / Indicators: Discomfort;Constant Pain Intervention(s): Monitored during session;Limited activity within patient's tolerance;Repositioned  Home Living                                          Prior Functioning/Environment              Frequency  Min 2X/week        Progress Toward Goals  OT Goals(current goals can now be found in the care plan section)  Progress towards OT goals: Progressing toward goals  Acute Rehab OT Goals Patient Stated Goal: feel better OT Goal Formulation: With patient Time For Goal Achievement: 07/27/20 Potential to Achieve Goals: [place] Frequency remains appropriate;Discharge plan remains appropriate    Co-evaluation      Reason for Co-Treatment: For patient/therapist safety;To address functional/ADL transfers PT goals addressed during  session: Balance;Mobility/safety with mobility        AM-PAC OT "6 Clicks" Daily Activity     Outcome Measure   Help from another person eating meals?: None Help from another person taking care of personal grooming?: A Little Help from another person toileting, which includes using toliet, bedpan, or urinal?: A Lot Help from another person bathing (including washing, rinsing, drying)?: A Lot Help from another person to put on and taking off regular upper body clothing?: A Little Help from another person to put on and taking off regular lower body clothing?: A Little 6 Click Score: 17    End of Session Equipment Utilized During Treatment: Oxygen;Other (comment) (2L )  OT Visit Diagnosis: Other abnormalities of gait and mobility (R26.89);Muscle weakness (generalized) (M62.81)   Activity Tolerance Treatment limited secondary to medical complications (Comment);Other (comment) (dizziness with mobility tasks)   Patient Left in bed;with call bell/phone within reach;with bed alarm set;Other (comment) (in chair position from bed)   Nurse Communication Mobility status        Time: 7253-6644 OT Time Calculation (min): 49 min  Charges: OT General Charges $OT Visit: 1 Visit OT Treatments $Therapeutic Activity: 8-22 mins  Corinne Ports K., COTA/L Acute  Rehabilitation Services [place] 07/17/2020, 1:54 PM

## 2020-07-17 NOTE — Progress Notes (Signed)
PROGRESS NOTE  Paul [name]  DOB: 1956-04-26  PCP: [name], MD SHF:[id number]  DOA: 06/11/2020  LOS: 33 days   Chief Complaint  Patient presents with   Weakness   Shortness of Breath   Tachycardia   Brief narrative: Paul [name] is a 65 y.o. male with PMH significant for atrial fibrillation on Eliquis, ESRD with failed renal transplant, on dialysis, TTS, diabetes mellitus type 2, chronic diastolic heart failure. Patient presented to the ED on 06/11/2020 with complaints of dyspnea in setting of missed dialysis.   During the course of hospitalization, he was treated for Covid pneumonia, Enterococcus bacteremia.   2D echo showed severely reduced EF.   At this time, patient is pending placement to SNF.  Subjective: Patient was seen and examined this morning. Not in distress.  No new symptoms.  Heart rate controlled.  Blood pressure low in 80s this morning. He is on low-flow oxygen this morning.  Assessment/Plan: Acute respiratory failure with hypoxemia -secondary to fluid overload, resolved with hemodialysis. -Back on low-flow oxygen this morning.  No new symptoms.  Wean down as tolerated.  Sepsis due to bacteremia/Enterococcus -patient had Enterococcus bacteremia likely urinary source.   -2D echo showed no evidence of vegetation.   -Previous hospitalist Dr. Olevia Bowens discussed with ID who recommended 2 weeks of IV antibiotics.  Antibiotics course has been completed in the hospital.    Atrial flutter Essential hypertension -Heart rate controlled.  Blood pressure slightly low in 80s today.  Continue metoprolol 37.5 mg twice daily.  It seems Imdur was resumed 2 days ago.  Keep it on hold again.  -Continue to monitor in telemetry. -Continue Eliquis for anticoagulation  Acute metabolic encephalopathy -Secondary to sepsis, resolved   COVID-19 pneumonia -Treated with remdesivir 5 days, steroids weaned off.   -Patient has completed isolation. Symptoms have  resolved.  ESRD-HD-TTS Chronic metabolic acidosis Failed renal transplant. -Continue immunosuppression with tacrolimus, prednisone.   -Continue Sevelamer, sodium bicarbonate -Nephrology following.  Diabetes mellitus type 2 Hypoglycemia --A1c 7.5 on 1/24. -Currently on 8 units Lantus daily along with sliding scale insulin.  Continue to monitor blood sugar. -Continue sliding scale insulin with Accu-Cheks. Recent Labs  Lab 07/16/20 1154 07/16/20 1704 07/16/20 2020 07/17/20 0647 07/17/20 1131  GLUCAP 211* 180* 212* 159* 174*   Hyperlipidemia -continue Lipitor  Depression -continue Zoloft  Mobility: Bilateral above-knee amputation status Code Status:   Code Status: Full Code  Nutritional status: Body mass index is 19.4 kg/m.     Diet Order            Diet renal with fluid restriction Fluid restriction: 1200 mL Fluid; Room service appropriate? Yes; Fluid consistency: Thin  Diet effective now                 DVT prophylaxis:  apixaban (ELIQUIS) tablet 5 mg   Antimicrobials:  None Fluid: None Consultants: Nephrology Family Communication:  Not at bedside  Status is: Inpatient Dispo:  Patient From:  Home  Planned Disposition: [place]  Expected discharge date: Whenever bed is available  medically stable for discharge:   Medically stable      Infusions:   sodium chloride     sodium chloride     sodium chloride      Scheduled Meds:  (feeding supplement) PROSource Plus  30 mL Oral BID BM   apixaban  5 mg Oral BID   aspirin  81 mg Oral Daily   atorvastatin  10 mg Oral Q M,W,F  Chlorhexidine Gluconate Cloth  6 each Topical Q0600   [START ON 07/18/2020] darbepoetin (ARANESP) injection - DIALYSIS  150 mcg Intravenous Q Tue-HD   doxercalciferol  2 mcg Intravenous Q T,Th,Sa-HD   feeding supplement (NEPRO CARB STEADY)  237 mL Oral TID WC   insulin aspart  0-6 Units Subcutaneous TID WC   insulin glargine  8 Units Subcutaneous Daily    lipase/protease/amylase  12,000 Units Oral TID AC   magnesium chloride  2 tablet Oral BID   metoprolol succinate  50 mg Oral Daily   pantoprazole  80 mg Oral Daily   predniSONE  5 mg Oral Q breakfast   sertraline  50 mg Oral QHS   sevelamer carbonate  800 mg Oral TID WC   sodium chloride flush  3 mL Intravenous Q12H   tacrolimus  1 mg Oral BID   tamsulosin  0.4 mg Oral QPC supper    Antimicrobials: Anti-infectives (From admission, onward)   Start     Dose/Rate Route Frequency Ordered Stop   06/24/20 1319  Vancomycin (VANCOCIN) 750-5 MG/150ML-% IVPB       Note to Pharmacy: Judieth Keens  : cabinet override      06/24/20 1319 06/24/20 1323   06/22/20 1200  vancomycin (VANCOCIN) IVPB 750 mg/150 ml premix        750 mg 150 mL/hr over 60 Minutes Intravenous Every T-Th-Sa (Hemodialysis) 06/21/20 1407 06/27/20 0958   06/21/20 1200  vancomycin (VANCOCIN) IVPB 750 mg/150 ml premix  Status:  Discontinued        750 mg 150 mL/hr over 60 Minutes Intravenous Every M-W-F (Hemodialysis) 06/20/20 0904 06/21/20 1407   06/20/20 1200  vancomycin (VANCOCIN) IVPB 750 mg/150 ml premix  Status:  Discontinued        750 mg 150 mL/hr over 60 Minutes Intravenous Every T-Th-Sa (Hemodialysis) 06/18/20 0819 06/19/20 0921   06/19/20 1200  vancomycin (VANCOCIN) IVPB 750 mg/150 ml premix        750 mg 150 mL/hr over 60 Minutes Intravenous Once in dialysis 06/19/20 0921 06/19/20 1657   06/17/20 1200  vancomycin (VANCOCIN) IVPB 500 mg/100 ml premix        500 mg 100 mL/hr over 60 Minutes Intravenous  Once 06/17/20 0811 06/17/20 1333   06/16/20 1200  vancomycin (VANCOCIN) IVPB 750 mg/150 ml premix        750 mg 150 mL/hr over 60 Minutes Intravenous  Once 06/15/20 1838 06/16/20 1626   06/13/20 1200  vancomycin (VANCOREADY) IVPB 750 mg/150 mL  Status:  Discontinued        750 mg 150 mL/hr over 60 Minutes Intravenous Every T-Th-Sa (Hemodialysis) 06/12/20 1202 06/17/20 0811   06/12/20 1245   vancomycin (VANCOREADY) IVPB 1750 mg/350 mL        1,750 mg 175 mL/hr over 120 Minutes Intravenous  Once 06/12/20 1158 06/12/20 1552   06/12/20 1000  remdesivir 100 mg in sodium chloride 0.9 % 100 mL IVPB       "Followed by" Linked Group Details   100 mg 200 mL/hr over 30 Minutes Intravenous Daily 06/11/20 1719 06/15/20 0902   06/11/20 2200  remdesivir 200 mg in sodium chloride 0.9% 250 mL IVPB       "Followed by" Linked Group Details   200 mg 580 mL/hr over 30 Minutes Intravenous Once 06/11/20 1719 06/12/20 0430      PRN meds: sodium chloride, sodium chloride, sodium chloride, acetaminophen, alteplase, chlorpheniramine-HYDROcodone, cyclobenzaprine, guaiFENesin-dextromethorphan, heparin, Ipratropium-Albuterol, lidocaine (PF), lidocaine-prilocaine, pentafluoroprop-tetrafluoroeth, polyethylene glycol, sodium chloride  flush, traMADol   Objective: Vitals:   07/17/20 0419 07/17/20 0836  BP: 105/69 (!) 84/71  Pulse: 67 93  Resp: 16 17  Temp: (!) 97.4 F (36.3 C) 97.9 F (36.6 C)  SpO2: 98% 100%    Intake/Output Summary (Last 24 hours) at 07/17/2020 1148 Last data filed at 07/17/2020 0200 Gross per 24 hour  Intake 1077 ml  Output 0 ml  Net 1077 ml   Filed Weights   07/16/20 0500 07/16/20 2018 07/17/20 0500  Weight: 64.5 kg 64.9 kg 64.9 kg   Weight change: 0.1 kg Body mass index is 19.4 kg/m.   Physical Exam: General exam: Pleasant elderly Caucasian male. Not in distress.  Skin: No rashes, lesions or ulcers. HEENT: Atraumatic, normocephalic, no obvious bleeding Lungs: Clear to auscultation bilaterally CVS: Irregular rhythm, rate controlled. GI/Abd soft, nontender, nondistended, bowel sound present CNS: Alert, awake, oriented x3 Psychiatry: Mood appropriate Extremities: Bilateral amputation status  Data Review: I have personally reviewed the laboratory data and studies available.  Recent Labs  Lab 07/11/20 2229 07/13/20 0624 07/15/20 0804  WBC 6.0 6.2 7.7  HGB 8.6*  8.5* 8.7*  HCT 28.6* 27.0* 29.1*  MCV 92.3 92.2 93.3  PLT 264 271 360   Recent Labs  Lab 07/11/20 2229 07/13/20 0624 07/15/20 0805  NA 134* 132* 134*  K 4.5 3.9 4.2  CL 99 97* 100  CO2 21* 22 22  GLUCOSE 98 201* 204*  BUN 106* 77* 78*  CREATININE 5.61* 4.50* 4.63*  CALCIUM 8.1* 8.3* 8.3*  PHOS 5.9* 5.2* 5.2*    F/u labs Unresulted Labs (From admission, onward)         None      Signed, Terrilee Croak, MD [facility] 07/17/2020

## 2020-07-17 NOTE — Progress Notes (Addendum)
[name] Progress Note   Subjective:     Paul [name] was seen and examined today at bedside. Patient reports episode of chest tightness over the weekend which resolved after nursing placed O2 via [place]. Reviewed VS trend, noted HR of 49 with occasional episodes BP in 80s sys. Currently, patient reports feeling well. Denies CP, SOB, ABD pain, N/V/D. Patient is awaiting SNF placement.   Objective Vitals:   07/16/20 2019 07/17/20 0419 07/17/20 0500 07/17/20 0836  BP: 110/78 105/69  (!) 84/71  Pulse: (!) 104 67  93  Resp: 16 16  17   Temp: 99 F (37.2 C) (!) 97.4 F (36.3 C)  97.9 F (36.6 C)  TempSrc:  Oral    SpO2: 98% 98%  100%  Weight:   64.9 kg   Height:       Physical Exam General:Well appearing man; in no acute respiratory distress; on 2L OS via [place] Heart:irregular, no murmur or friction rub Lungs:CTA anteriorly, no wheezing or rales Abdomen:soft, round, non-tender Extremities:B BKA without edema Dialysis Access:R AVF + thrill  Filed Weights   07/16/20 0500 07/16/20 2018 07/17/20 0500  Weight: 64.5 kg 64.9 kg 64.9 kg    Intake/Output Summary (Last 24 hours) at 07/17/2020 1024 Last data filed at 07/17/2020 0200 Gross per 24 hour  Intake 1314 ml  Output 0 ml  Net 1314 ml    Additional Objective Labs: Basic Metabolic Panel: Recent Labs  Lab 07/11/20 2229 07/13/20 0624 07/15/20 0805  NA 134* 132* 134*  K 4.5 3.9 4.2  CL 99 97* 100  CO2 21* 22 22  GLUCOSE 98 201* 204*  BUN 106* 77* 78*  CREATININE 5.61* 4.50* 4.63*  CALCIUM 8.1* 8.3* 8.3*  PHOS 5.9* 5.2* 5.2*   Liver Function Tests: Recent Labs  Lab 07/11/20 2229 07/13/20 0624 07/15/20 0805  ALBUMIN 1.7* 1.7* 1.7*   No results for input(s): LIPASE, AMYLASE in the last 168 hours. CBC: Recent Labs  Lab 07/11/20 2229 07/13/20 0624 07/15/20 0804  WBC 6.0 6.2 7.7  HGB 8.6* 8.5* 8.7*  HCT 28.6* 27.0* 29.1*  MCV 92.3 92.2 93.3  PLT 264 271 360   Blood Culture    Component  Value Date/Time   SDES BLOOD LEFT HAND 06/13/2020 0803   SDES BLOOD LEFT HAND 06/13/2020 0803   SPECREQUEST  06/13/2020 0803    BOTTLES DRAWN AEROBIC AND ANAEROBIC Blood Culture adequate volume   SPECREQUEST  06/13/2020 0803    BOTTLES DRAWN AEROBIC AND ANAEROBIC Blood Culture adequate volume   CULT  06/13/2020 0803    NO GROWTH 5 DAYS Performed at [facility], [place] [address], [place], [place] [address]    CULT  06/13/2020 0803    NO GROWTH 5 DAYS Performed at [facility] [address], [place], [place] [address]    REPTSTATUS 06/18/2020 FINAL 06/13/2020 0803   REPTSTATUS 06/18/2020 FINAL 06/13/2020 0803    Cardiac Enzymes: No results for input(s): CKTOTAL, CKMB, CKMBINDEX, TROPONINI in the last 168 hours. CBG: Recent Labs  Lab 07/16/20 0637 07/16/20 1154 07/16/20 1704 07/16/20 2020 07/17/20 0647  GLUCAP 159* 211* 180* 212* 159*   Iron Studies: No results for input(s): IRON, TIBC, TRANSFERRIN, FERRITIN in the last 72 hours. Lab Results  Component Value Date   INR 1.2 01/04/2020   INR 1.2 10/28/2018   INR 1.25 06/14/2013   Studies/Results: No results found.  Medications: . sodium chloride     . (feeding supplement) PROSource Plus  30  mL Oral BID BM  . apixaban  5 mg Oral BID  . aspirin  81 mg Oral Daily  . atorvastatin  10 mg Oral Q M,W,F  . Chlorhexidine Gluconate Cloth  6 each Topical Q0600  . [START ON 07/18/2020] darbepoetin (ARANESP) injection - DIALYSIS  150 mcg Intravenous Q Tue-HD  . doxercalciferol  2 mcg Intravenous Q T,Th,Sa-HD  . feeding supplement (NEPRO CARB STEADY)  237 mL Oral TID WC  . insulin aspart  0-6 Units Subcutaneous TID WC  . insulin glargine  8 Units Subcutaneous Daily  . isosorbide mononitrate  15 mg Oral Daily  . lipase/protease/amylase  12,000 Units Oral TID AC  . magnesium chloride  2 tablet Oral BID  . metoprolol succinate  50 mg Oral Daily  . pantoprazole  80 mg Oral Daily  . predniSONE  5 mg Oral Q breakfast  .  sertraline  50 mg Oral QHS  . sevelamer carbonate  800 mg Oral TID WC  . sodium chloride flush  3 mL Intravenous Q12H  . tacrolimus  2 mg Oral BID  . tamsulosin  0.4 mg Oral QPC supper    Dialysis Orders: TTS at [facility] 3:30hr, 400/800, EDW 66kg, 3K/2.25Ca, AVF, no heparin  - Hectoral 70mcg Iv q HD - Mircera 272mcg IV q 2 weeks (last given 1/13 - Hgb 10.9 at the time)  Assessment/Plan: 1. Sepsis 2/2 Enterococcus bacteremia(resolved): Completed antibiotics course. Echo without vegetations. 2. COVID-19/PNA(resolved): Tested (+) 06/11/20, off isolation. 3.ESRD:Continue HD per TTS schedule - Scheduled for HD tomorrow. Remains on low dose IS for prior kidney transplant.  Will plan to decrease prograf tomorrow to 1 BID and eventually wean 4. Anemiaof CKD: Hgb 8.7 - continueAranespq Tuesday, dose recently increased to 156mcg 5. Secondary hyperparathyroidism- Ca/Phos at goal. Continue VDRA- hectorol and binders- renvela. 6.HTN/volume: Noted episodes of soft BP; per Hospitalist, imdur on hold. Continue metoprolol - no evidence of fluid volume overload at this time. 7. Nutrition-Alb remains very low, continue protein supplements. 8. Tachy/brady syndrome- Hx AFib/RVR. - On metoprolol, Eliquis. Not symptomatic. Monitor.  9. Dispo:Per Hospitalist note, anticipated discharge to SNFsoon.  Paul Poet, NP Paul [name] 07/17/2020,10:24 AM  LOS: 36 days    Patient seen and examined, agree with above note with above modifications. NO new complaints.  Planning for routine HD tomorrow.  Dialysis related meds are appropriately dosed, will work on slowly weaning IS meds  Corliss Parish, MD 07/17/2020

## 2020-07-17 NOTE — Progress Notes (Signed)
Physical Therapy Treatment Patient Details Name: Paul [name] MRN: [id number] DOB: [birth year] Today's Date: 07/17/2020    History of Present Illness 65 yo male presenting to ED via EMS after missing dialysis and weakness with COVID-19 +. Off COVID precautions on 2/3. PMH including HTN, DM2, renal transplant, CKD st IV, calciphylaxis, A-Fib, bil. TTAs, bilateral BKA with prosthesis,and TIA.   PT Comments    Pt received in bed, willing to participate in PT. A-fib rhythm with HR between 100-130s. VSS on 2L O2. No evidence of orthostatics or hypotension upon sitting on EOB, however pt reported increased dizziness that would not subside. During vestibular screen, pt unable to maintain eyes on target during smooth pursuits/occular tracking, head impulse test, and VOR cancellation. Some increase in dizziness and nausea with horizontal saccades. Horizontal head movements more provoking than vertical head movements. No evidence of rotational nystagmus. Suspect vestibular involvement, but plan to examine for central vestibular signs and test for horizontal canal involvement. Pt left in bed with all needs met, call bell within reach, and bed alarm active.     Follow Up Recommendations  SNF;Supervision for mobility/OOB     Equipment Recommendations  Rolling walker with 5" wheels;3in1 (PT);Wheelchair (measurements PT);Wheelchair cushion (measurements PT) (TBD)    Recommendations for Other Services       Precautions / Restrictions Precautions Precautions: Fall;Other (comment) Precaution Comments: B BKA with prosthetics in room now, mod fall Restrictions Weight Bearing Restrictions: No    Mobility  Bed Mobility Overal bed mobility: Needs Assistance Bed Mobility: Rolling;Sidelying to Sit;Sit to Sidelying Rolling: Modified independent (Device/Increase time) Sidelying to sit: +2 for physical assistance;Max assist     Sit to sidelying: Min assist General bed mobility comments: Use of bed rails  for rolling, Max A x2 for sidelying to sit to bring trunk upright    Transfers                 General transfer comment: Unable due to dizziness  Ambulation/Gait             General Gait Details: unable   Stairs             Wheelchair Mobility    Modified Rankin (Stroke Patients Only)       Balance Overall balance assessment: Needs assistance Sitting-balance support: Feet unsupported;Single extremity supported Sitting balance-Leahy Scale: Poor Sitting balance - Comments: Needed min guard for donning prosthetics in sitting, reliant on UE support   Standing balance support: Bilateral upper extremity supported;During functional activity Standing balance-Leahy Scale: Zero Standing balance comment: Reliant on external assistance and B UE support                            Cognition Arousal/Alertness: Awake/alert Behavior During Therapy: Flat affect Overall Cognitive Status: Impaired/Different from baseline Area of Impairment: Problem solving;Safety/judgement                   Current Attention Level: Sustained     Safety/Judgement: Decreased awareness of safety;Decreased awareness of deficits Awareness: Emergent Problem Solving: Slow processing;Requires verbal cues General Comments: Increased time to respond, but appropriate responses      Exercises      General Comments General comments (skin integrity, edema, etc.): A-fib rhythm, HR 100-130s. SPO2 > 96% on 2L. BP stable ~100s/70s throughout session. Completed smooth pursuits/tracking, saccades, head impulse test, and VOR cancellation. Inability to maintain eyes on target at slow self-selected speed. Increased dizziness and minimal  nausea with horizontal saccades.      Pertinent Vitals/Pain Pain Assessment: Faces Faces Pain Scale: Hurts whole lot Pain Location: dizziness Pain Descriptors / Indicators: Discomfort;Constant Pain Intervention(s): Limited activity within patient's  tolerance;Monitored during session;Repositioned    Home Living                      Prior Function            PT Goals (current goals can now be found in the care plan section)      Frequency    Min 2X/week      PT Plan Current plan remains appropriate    Co-evaluation PT/OT/SLP Co-Evaluation/Treatment: Yes Reason for Co-Treatment: To address functional/ADL transfers;For patient/therapist safety PT goals addressed during session: Balance;Mobility/safety with mobility        AM-PAC PT "6 Clicks" Mobility   Outcome Measure  Help needed turning from your back to your side while in a flat bed without using bedrails?: None Help needed moving from lying on your back to sitting on the side of a flat bed without using bedrails?: A Lot Help needed moving to and from a bed to a chair (including a wheelchair)?: A Lot Help needed standing up from a chair using your arms (e.g., wheelchair or bedside chair)?: A Lot Help needed to walk in hospital room?: A Lot Help needed climbing 3-5 steps with a railing? : A Lot 6 Click Score: 14    End of Session Equipment Utilized During Treatment: Gait belt Activity Tolerance: Patient tolerated treatment well;Other (comment) (Limited by dizziness) Patient left: with call bell/phone within reach;in bed;with bed alarm set Nurse Communication: Mobility status PT Visit Diagnosis: Muscle weakness (generalized) (M62.81);Other abnormalities of gait and mobility (R26.89)     Time:  -     Charges:                        Rosita Kea, SPT

## 2020-07-18 LAB — CBC
HCT: 28 % — ABNORMAL LOW (ref 39.0–52.0)
Hemoglobin: 8.5 g/dL — ABNORMAL LOW (ref 13.0–17.0)
MCH: 28.5 pg (ref 26.0–34.0)
MCHC: 30.4 g/dL (ref 30.0–36.0)
MCV: 94 fL (ref 80.0–100.0)
Platelets: 346 10*3/uL (ref 150–400)
RBC: 2.98 MIL/uL — ABNORMAL LOW (ref 4.22–5.81)
RDW: 17.2 % — ABNORMAL HIGH (ref 11.5–15.5)
WBC: 9.3 10*3/uL (ref 4.0–10.5)
nRBC: 0 % (ref 0.0–0.2)

## 2020-07-18 LAB — GLUCOSE, CAPILLARY
Glucose-Capillary: 174 mg/dL — ABNORMAL HIGH (ref 70–99)
Glucose-Capillary: 369 mg/dL — ABNORMAL HIGH (ref 70–99)
Glucose-Capillary: 309 mg/dL — ABNORMAL HIGH (ref 70–99)

## 2020-07-18 LAB — RENAL FUNCTION PANEL
Albumin: 1.7 g/dL — ABNORMAL LOW (ref 3.5–5.0)
Anion gap: 13 (ref 5–15)
BUN: 104 mg/dL — ABNORMAL HIGH (ref 8–23)
CO2: 20 mmol/L — ABNORMAL LOW (ref 22–32)
Calcium: 8.2 mg/dL — ABNORMAL LOW (ref 8.9–10.3)
Chloride: 99 mmol/L (ref 98–111)
Creatinine, Ser: 5.05 mg/dL — ABNORMAL HIGH (ref 0.61–1.24)
GFR, Estimated: 12 mL/min — ABNORMAL LOW (ref >60)
Glucose, Bld: 232 mg/dL — ABNORMAL HIGH (ref 70–99)
Phosphorus: 5.5 mg/dL — ABNORMAL HIGH (ref 2.5–4.6)
Potassium: 3.8 mmol/L (ref 3.5–5.1)
Sodium: 132 mmol/L — ABNORMAL LOW (ref 135–145)

## 2020-07-18 MED ORDER — DARBEPOETIN ALFA 150 MCG/0.3ML IJ SOSY
PREFILLED_SYRINGE | INTRAMUSCULAR | Status: AC
  Administered 2020-07-18: 150 ug via INTRAVENOUS
  Filled 2020-07-18: qty 0.3

## 2020-07-18 MED ORDER — DOXERCALCIFEROL 4 MCG/2ML IV SOLN
INTRAVENOUS | Status: AC
  Administered 2020-07-18: 2 ug via INTRAVENOUS
  Filled 2020-07-18: qty 2

## 2020-07-18 MED ORDER — INSULIN GLARGINE 100 UNIT/ML ~~LOC~~ SOLN
10.0000 [IU] | Freq: Every day | SUBCUTANEOUS | Status: DC
  Administered 2020-07-19 – 2020-07-20 (×2): 10 [IU] via SUBCUTANEOUS
  Filled 2020-07-18 (×2): qty 0.1

## 2020-07-18 NOTE — Procedures (Signed)
Patient was seen on dialysis and the procedure was supervised.  BFR 400  Via AVF BP is  104/65.   Patient appears to be tolerating treatment well  [name] 07/18/2020

## 2020-07-18 NOTE — Care Management Important Message (Signed)
Important Message  Patient Details  Name: Paul [name] MRN: [id number] Date of Birth: 1956-04-21   Medicare Important Message Given:  Yes     Koven Belinsky P Murl Zogg 07/18/2020, 2:28 PM

## 2020-07-18 NOTE — Plan of Care (Signed)
  Problem: Health Behavior/Discharge Planning: Goal: Ability to manage health-related needs will improve Outcome: Progressing   Problem: Activity: Goal: Risk for activity intolerance will decrease Outcome: Progressing   Problem: Education: Goal: Knowledge of disease or condition will improve Outcome: Progressing

## 2020-07-18 NOTE — Progress Notes (Addendum)
. Subjective:  Seen on HD  No cos, states now off Oxygen with out CP or sob /agrees to try recliner HD next hd . awaiting  [facility]  Objective Vital signs in last 24 hours: Vitals:   07/17/20 1159 07/17/20 1654 07/17/20 2036 07/18/20 0502  BP: 100/78 105/68 104/78 135/90  Pulse: 100 74 92 95  Resp:  16 18 18   Temp:  (!) 97.4 F (36.3 C) 98.2 F (36.8 C) 98.8 F (37.1 C)  TempSrc:  Oral Oral Oral  SpO2: 97% 99% 99% 96%  Weight:      Height:       Weight change:   Physical Exam General:alert , calm  On hd chronically ill male NAD  Heart:irregular,Irregular  no MRG Lungs:CTA anteriorly, non labored  On Rm Air  Abdomen: BS +, soft,  NT, ND  Extremities:B BKA without edema Dialysis Access:R AVF  Patent on hd   Dialysis Orders: [facility]TS at [facility] 3:30hr, 400/800, EDW 66kg, 3K/2.25Ca, AVF, no heparin  - Hectoral 73mcg Iv q HD - Mircera 27mcg IV q 2 weeks (last given 1/13 - Hgb 10.9 at the time)  Problem/Plan: 1. Sepsis 2/2 Enterococcus bacteremia(resolved): Completed antibiotics course. Echo without vegetations. 2. COVID-19/PNA(resolved): Tested (+) 06/11/20, off isolation. 3.ESRD:on  HD  TTS schedule, labs pending pre hd, Remains on low dose IS for prior kidney transplant.  Will plan to decrease prograf  1 BID and eventually wean.Try Recliner Hd next tx  4. Anemiaof CKD: Hgb 8.7 - continueAranespq Tuesday,dose recently increased to 13mcg due today 03/01  5. Secondary hyperparathyroidism- Ca/Phos at goal- . Continue VDRA- hectorol and binders- renvela. 6.HTN/volume: Noted episodes of soft BP; per Hospitalist, imdur on hold. Continue metoprolol - no evidence of fluid volume overload at this time, relatively low goal. 7. Nutrition-Alb remains very low,1.7 continue protein supplements. 8. Tachy/brady syndrome- HxAFib/RVR. - On metoprolol, Eliquis. Not symptomatic. Monitor.  9. Dispo:Per Hospitalist note, anticipated discharge to SNF[facility].  Ernest Haber,  PA-C [place] [phone] 07/18/2020,7:41 AM  LOS: 37 days    Patient seen and examined, agree with above note with above modifications. Seen on HD-  No c/o's-  Awaiting placement.  Pre HD bun over 100- if still here later in week may need longer treatment.  Otherwise dialysis related meds appropriately dosed  Corliss Parish, MD 07/18/2020     Labs: Basic Metabolic Panel: Recent Labs  Lab 07/13/20 0624 07/15/20 0805 07/18/20 0800  NA 132* 134* 132*  K 3.9 4.2 3.8  CL 97* 100 99  CO2 22 22 20*  GLUCOSE 201* 204* 232*  BUN 77* 78* 104*  CREATININE 4.50* 4.63* 5.05*  CALCIUM 8.3* 8.3* 8.2*  PHOS 5.2* 5.2* 5.5*   Liver Function Tests: Recent Labs  Lab 07/11/20 2229 07/13/20 0624 07/15/20 0805  ALBUMIN 1.7* 1.7* 1.7*   No results for input(s): LIPASE, AMYLASE in the last 168 hours. No results for input(s): AMMONIA in the last 168 hours. CBC: Recent Labs  Lab 07/11/20 2229 07/13/20 0624 07/15/20 0804 07/18/20 0800  WBC 6.0 6.2 7.7 9.3  HGB 8.6* 8.5* 8.7* 8.5*  HCT 28.6* 27.0* 29.1* 28.0*  MCV 92.3 92.2 93.3 94.0  PLT 264 271 360 346   Cardiac Enzymes: No results for input(s): CKTOTAL, CKMB, CKMBINDEX, TROPONINI in the last 168 hours. CBG: Recent Labs  Lab 07/17/20 1131 07/17/20 1207 07/17/20 1720 07/17/20 2037 07/17/20 2201  GLUCAP 174* 188* 272* 294* 303*    Studies/Results: No results found. Medications: . sodium chloride    .  sodium chloride    . sodium chloride     . (feeding supplement) PROSource Plus  30 mL Oral BID BM  . apixaban  5 mg Oral BID  . aspirin  81 mg Oral Daily  . atorvastatin  10 mg Oral Q M,W,F  . Chlorhexidine Gluconate Cloth  6 each Topical Q0600  . darbepoetin (ARANESP) injection - DIALYSIS  150 mcg Intravenous Q Tue-HD  . doxercalciferol  2 mcg Intravenous Q T,Th,Sa-HD  . feeding supplement (NEPRO CARB STEADY)  237 mL Oral TID WC  . insulin aspart  0-6 Units Subcutaneous TID WC  . insulin  glargine  8 Units Subcutaneous Daily  . lipase/protease/amylase  12,000 Units Oral TID AC  . magnesium chloride  2 tablet Oral BID  . metoprolol succinate  50 mg Oral Daily  . pantoprazole  80 mg Oral Daily  . predniSONE  5 mg Oral Q breakfast  . sertraline  50 mg Oral QHS  . sevelamer carbonate  800 mg Oral TID WC  . sodium chloride flush  3 mL Intravenous Q12H  . tacrolimus  1 mg Oral BID  . tamsulosin  0.4 mg Oral QPC supper

## 2020-07-18 NOTE — Plan of Care (Signed)
  Problem: Health Behavior/Discharge Planning: Goal: Ability to manage health-related needs will improve Outcome: Progressing   Problem: Education: Goal: Knowledge of disease or condition will improve Outcome: Progressing

## 2020-07-18 NOTE — Progress Notes (Signed)
PROGRESS NOTE  Paul [name]  DOB: 1955/10/05  PCP: [name], MD JOI:[id number]  DOA: 06/11/2020  LOS: 37 days   Chief Complaint  Patient presents with  . Weakness  . Shortness of Breath  . Tachycardia   Brief narrative: Paul [name] is a 65 y.o. male with PMH significant for atrial fibrillation on Eliquis, ESRD with failed renal transplant, on dialysis, TTS, diabetes mellitus type 2, chronic diastolic heart failure. Patient presented to the ED on 06/11/2020 with complaints of dyspnea in setting of missed dialysis.   During the course of hospitalization, he was treated for Covid pneumonia, Enterococcus bacteremia.   2D echo showed severely reduced EF.   At this time, patient is pending placement to SNF.  Subjective: Patient was seen and examined this morning in dialysis.. Not in distress.  No new symptoms.  I noticed that he was greatly tachycardic this morning with drop in blood pressure.  Both stabilized later.  Assessment/Plan: Acute respiratory failure with hypoxemia -secondary to fluid overload, resolved with hemodialysis. -Back on low-flow oxygen this morning.  No new symptoms.  Wean down as tolerated.  Sepsis due to bacteremia/Enterococcus -patient had Enterococcus bacteremia as the likely urinary source.   -2D echo showed no evidence of vegetation.   -Previous hospitalist Dr. Olevia Bowens discussed with ID who recommended 2 weeks of IV antibiotics.  Antibiotics course has been completed in the hospital.    Atrial flutter Essential hypertension -Currently heart rate and blood pressure overall controlled on metoprolol 37.5 mg twice daily.  Imdur remains on hold.  -Continue to monitor in telemetry. -Continue Eliquis for anticoagulation  Acute metabolic encephalopathy -Secondary to sepsis, resolved   COVID-19 pneumonia -Treated with remdesivir 5 days, steroids weaned off.   -Patient has completed isolation. Symptoms have resolved.  ESRD-HD-TTS Chronic metabolic  acidosis Failed renal transplant. -Continue immunosuppression with tacrolimus, prednisone.   -Continue Sevelamer, sodium bicarbonate -Nephrology following.  Diabetes mellitus type 2 Hypoglycemia --A1c 7.5 on 1/24. -Currently on 8 units Lantus daily along with sliding scale insulin.  Based on blood sugar trend, will increase to 10 units for tomorrow.  Continue sliding scale insulin. Recent Labs  Lab 07/17/20 1131 07/17/20 1207 07/17/20 1720 07/17/20 2037 07/17/20 2201  GLUCAP 174* 188* 272* 294* 303*   Hyperlipidemia -continue Lipitor  Depression -continue Zoloft  Mobility: Bilateral above-knee amputation status Code Status:   Code Status: Full Code  Nutritional status: Body mass index is 19.79 kg/m.     Diet Order            Diet renal with fluid restriction Fluid restriction: 1200 mL Fluid; Room service appropriate? Yes; Fluid consistency: Thin  Diet effective now                 DVT prophylaxis:  apixaban (ELIQUIS) tablet 5 mg   Antimicrobials:  None Fluid: None Consultants: Nephrology Family Communication:  Not at bedside  Status is: Inpatient Dispo:  Patient From:  Home  Planned Disposition: [place]  Expected discharge date: Whenever bed is available  medically stable for discharge:   Medically stable      Infusions:  . sodium chloride      Scheduled Meds: . (feeding supplement) PROSource Plus  30 mL Oral BID BM  . apixaban  5 mg Oral BID  . aspirin  81 mg Oral Daily  . atorvastatin  10 mg Oral Q M,W,F  . Chlorhexidine Gluconate Cloth  6 each Topical Q0600  . darbepoetin (ARANESP) injection - DIALYSIS  150 mcg Intravenous Q Tue-HD  . doxercalciferol  2 mcg Intravenous Q T,Th,Sa-HD  . feeding supplement (NEPRO CARB STEADY)  237 mL Oral TID WC  . insulin aspart  0-6 Units Subcutaneous TID WC  . [START ON 07/19/2020] insulin glargine  10 Units Subcutaneous Daily  . lipase/protease/amylase  12,000 Units Oral TID AC  . magnesium  chloride  2 tablet Oral BID  . metoprolol succinate  50 mg Oral Daily  . pantoprazole  80 mg Oral Daily  . predniSONE  5 mg Oral Q breakfast  . sertraline  50 mg Oral QHS  . sevelamer carbonate  800 mg Oral TID WC  . sodium chloride flush  3 mL Intravenous Q12H  . tacrolimus  1 mg Oral BID  . tamsulosin  0.4 mg Oral QPC supper    Antimicrobials: Anti-infectives (From admission, onward)   Start     Dose/Rate Route Frequency Ordered Stop   06/24/20 1319  Vancomycin (VANCOCIN) 750-5 MG/150ML-% IVPB       Note to Pharmacy: Judieth Keens  : cabinet override      06/24/20 1319 06/24/20 1323   06/22/20 1200  vancomycin (VANCOCIN) IVPB 750 mg/150 ml premix        750 mg 150 mL/hr over 60 Minutes Intravenous Every T-Th-Sa (Hemodialysis) 06/21/20 1407 06/27/20 0958   06/21/20 1200  vancomycin (VANCOCIN) IVPB 750 mg/150 ml premix  Status:  Discontinued        750 mg 150 mL/hr over 60 Minutes Intravenous Every M-W-F (Hemodialysis) 06/20/20 0904 06/21/20 1407   06/20/20 1200  vancomycin (VANCOCIN) IVPB 750 mg/150 ml premix  Status:  Discontinued        750 mg 150 mL/hr over 60 Minutes Intravenous Every T-Th-Sa (Hemodialysis) 06/18/20 0819 06/19/20 0921   06/19/20 1200  vancomycin (VANCOCIN) IVPB 750 mg/150 ml premix        750 mg 150 mL/hr over 60 Minutes Intravenous Once in dialysis 06/19/20 0921 06/19/20 1657   06/17/20 1200  vancomycin (VANCOCIN) IVPB 500 mg/100 ml premix        500 mg 100 mL/hr over 60 Minutes Intravenous  Once 06/17/20 0811 06/17/20 1333   06/16/20 1200  vancomycin (VANCOCIN) IVPB 750 mg/150 ml premix        750 mg 150 mL/hr over 60 Minutes Intravenous  Once 06/15/20 1838 06/16/20 1626   06/13/20 1200  vancomycin (VANCOREADY) IVPB 750 mg/150 mL  Status:  Discontinued        750 mg 150 mL/hr over 60 Minutes Intravenous Every T-Th-Sa (Hemodialysis) 06/12/20 1202 06/17/20 0811   06/12/20 1245  vancomycin (VANCOREADY) IVPB 1750 mg/350 mL        1,750 mg 175 mL/hr  over 120 Minutes Intravenous  Once 06/12/20 1158 06/12/20 1552   06/12/20 1000  remdesivir 100 mg in sodium chloride 0.9 % 100 mL IVPB       "Followed by" Linked Group Details   100 mg 200 mL/hr over 30 Minutes Intravenous Daily 06/11/20 1719 06/15/20 0902   06/11/20 2200  remdesivir 200 mg in sodium chloride 0.9% 250 mL IVPB       "Followed by" Linked Group Details   200 mg 580 mL/hr over 30 Minutes Intravenous Once 06/11/20 1719 06/12/20 0430      PRN meds: sodium chloride, acetaminophen, chlorpheniramine-HYDROcodone, cyclobenzaprine, guaiFENesin-dextromethorphan, Ipratropium-Albuterol, polyethylene glycol, sodium chloride flush, traMADol   Objective: Vitals:   07/18/20 1031 07/18/20 1118  BP: 93/69 100/80  Pulse: (!) 117 90  Resp: 19 18  Temp: 97.7 F (36.5 C) 99.4 F (37.4 C)  SpO2: 96% 96%    Intake/Output Summary (Last 24 hours) at 07/18/2020 1134 Last data filed at 07/18/2020 0600 Gross per 24 hour  Intake 597 ml  Output --  Net 597 ml   Filed Weights   07/17/20 0500 07/18/20 0731 07/18/20 1031  Weight: 64.9 kg 67.4 kg 66.2 kg   Weight change:  Body mass index is 19.79 kg/m.   Physical Exam: General exam: Pleasant elderly Caucasian male. Not in distress.  Skin: No rashes, lesions or ulcers. HEENT: Atraumatic, normocephalic, no obvious bleeding Lungs: Clear to auscultation bilaterally CVS: Irregular rhythm, rate controlled. GI/Abd soft, nontender, nondistended, bowel sound present CNS: Alert, awake, oriented x3 Psychiatry: Mood appropriate Extremities: Bilateral amputation status  Data Review: I have personally reviewed the laboratory data and studies available.  Recent Labs  Lab 07/11/20 2229 07/13/20 0624 07/15/20 0804 07/18/20 0800  WBC 6.0 6.2 7.7 9.3  HGB 8.6* 8.5* 8.7* 8.5*  HCT 28.6* 27.0* 29.1* 28.0*  MCV 92.3 92.2 93.3 94.0  PLT 264 271 360 346   Recent Labs  Lab 07/11/20 2229 07/13/20 0624 07/15/20 0805 07/18/20 0800  NA 134* 132*  134* 132*  K 4.5 3.9 4.2 3.8  CL 99 97* 100 99  CO2 21* 22 22 20*  GLUCOSE 98 201* 204* 232*  BUN 106* 77* 78* 104*  CREATININE 5.61* 4.50* 4.63* 5.05*  CALCIUM 8.1* 8.3* 8.3* 8.2*  PHOS 5.9* 5.2* 5.2* 5.5*    F/u labs Unresulted Labs (From admission, onward)          Start     Ordered   Unscheduled  CBC with Differential/Platelet  Tomorrow morning,   R       Question:  Specimen collection method  Answer:  Lab=Lab collect   07/18/20 1134   Unscheduled  Basic metabolic panel  Tomorrow morning,   R       Question:  Specimen collection method  Answer:  Lab=Lab collect   07/18/20 1134          Signed, Terrilee Croak, MD [facility] 07/18/2020

## 2020-07-19 LAB — CBC WITH DIFFERENTIAL/PLATELET
Abs Immature Granulocytes: 0.09 10*3/uL — ABNORMAL HIGH (ref 0.00–0.07)
Basophils Absolute: 0 10*3/uL (ref 0.0–0.1)
Basophils Relative: 0 %
Eosinophils Absolute: 0.1 10*3/uL (ref 0.0–0.5)
Eosinophils Relative: 1 %
HCT: 28.7 % — ABNORMAL LOW (ref 39.0–52.0)
Hemoglobin: 8.7 g/dL — ABNORMAL LOW (ref 13.0–17.0)
Immature Granulocytes: 1 %
Lymphocytes Relative: 18 %
Lymphs Abs: 1.6 10*3/uL (ref 0.7–4.0)
MCH: 28.1 pg (ref 26.0–34.0)
MCHC: 30.3 g/dL (ref 30.0–36.0)
MCV: 92.6 fL (ref 80.0–100.0)
Monocytes Absolute: 1 10*3/uL (ref 0.1–1.0)
Monocytes Relative: 11 %
Neutro Abs: 6.2 10*3/uL (ref 1.7–7.7)
Neutrophils Relative %: 69 %
Platelets: 310 10*3/uL (ref 150–400)
RBC: 3.1 MIL/uL — ABNORMAL LOW (ref 4.22–5.81)
RDW: 17.2 % — ABNORMAL HIGH (ref 11.5–15.5)
WBC: 8.9 10*3/uL (ref 4.0–10.5)
nRBC: 0 % (ref 0.0–0.2)

## 2020-07-19 LAB — BASIC METABOLIC PANEL
Anion gap: 12 (ref 5–15)
BUN: 77 mg/dL — ABNORMAL HIGH (ref 8–23)
CO2: 23 mmol/L (ref 22–32)
Calcium: 8.2 mg/dL — ABNORMAL LOW (ref 8.9–10.3)
Chloride: 99 mmol/L (ref 98–111)
Creatinine, Ser: 4.08 mg/dL — ABNORMAL HIGH (ref 0.61–1.24)
GFR, Estimated: 15 mL/min — ABNORMAL LOW (ref >60)
Glucose, Bld: 238 mg/dL — ABNORMAL HIGH (ref 70–99)
Potassium: 3.4 mmol/L — ABNORMAL LOW (ref 3.5–5.1)
Sodium: 134 mmol/L — ABNORMAL LOW (ref 135–145)

## 2020-07-19 LAB — GLUCOSE, CAPILLARY
Glucose-Capillary: 230 mg/dL — ABNORMAL HIGH (ref 70–99)
Glucose-Capillary: 211 mg/dL — ABNORMAL HIGH (ref 70–99)
Glucose-Capillary: 242 mg/dL — ABNORMAL HIGH (ref 70–99)
Glucose-Capillary: 234 mg/dL — ABNORMAL HIGH (ref 70–99)

## 2020-07-19 MED ORDER — ALTEPLASE 2 MG IJ SOLR: 2.0000 mg | Freq: Once | INTRAMUSCULAR | Status: DC | PRN

## 2020-07-19 MED ORDER — LIDOCAINE HCL (PF) 1 % IJ SOLN
5.0000 mL | INTRAMUSCULAR | Status: DC | PRN
Start: 2020-07-19 — End: 2020-07-20

## 2020-07-19 MED ORDER — ISOSORBIDE MONONITRATE ER 30 MG PO TB24
15.0000 mg | ORAL_TABLET | Freq: Every day | ORAL | Status: DC
  Administered 2020-07-19: 15 mg via ORAL
  Filled 2020-07-19 (×2): qty 1

## 2020-07-19 MED ORDER — SODIUM CHLORIDE 0.9 % IV SOLN
100.0000 mL | INTRAVENOUS | Status: DC | PRN
Start: 2020-07-19 — End: 2020-07-20

## 2020-07-19 MED ORDER — HEPARIN SODIUM (PORCINE) 1000 UNIT/ML DIALYSIS
1000.0000 [IU] | INTRAMUSCULAR | Status: DC | PRN
Start: 2020-07-19 — End: 2020-07-20

## 2020-07-19 MED ORDER — SODIUM CHLORIDE 0.9 % IV SOLN: 100.0000 mL | INTRAVENOUS | Status: DC | PRN

## 2020-07-19 MED ORDER — LIDOCAINE-PRILOCAINE 2.5-2.5 % EX CREA
1.0000 | TOPICAL_CREAM | CUTANEOUS | Status: DC | PRN
Start: 2020-07-19 — End: 2020-07-20

## 2020-07-19 MED ORDER — PENTAFLUOROPROP-TETRAFLUOROETH EX AERO: 1.0000 "application " | INHALATION_SPRAY | CUTANEOUS | Status: DC | PRN

## 2020-07-19 MED ORDER — HYDRALAZINE HCL 10 MG PO TABS
10.0000 mg | ORAL_TABLET | Freq: Three times a day (TID) | ORAL | Status: DC
  Administered 2020-07-19 (×2): 10 mg via ORAL
  Filled 2020-07-19 (×4): qty 1

## 2020-07-19 NOTE — TOC Progression Note (Signed)
Transition of Care [facility]) - Progression Note    Patient Details  Name: Paul [name] MRN: [id number] Date of Birth: 1955-08-13  [facility]) CM/SW Contact  Sharlet Salina Mila Homer, LCSW Phone Number: 07/19/2020, 7:26 PM  Clinical Narrative:  Patient provided with bed offers and chose [facility]. Contact made with Loma Boston, admissions director at [facility] regarding patient's SNF choice. Per Loma Boston, she will run his insurance and get back with CSW. Contacted later and informed that they can take patient tomorrow.  MD contacted and informed that patient has a facility for ST rehab and MD will discharge patient on Wednesday, 3/3.[name]    Expected Discharge Plan: Skilled Nursing Facility Barriers to Discharge: Continued Medical Work up  Expected Discharge Plan and Services Expected Discharge Plan: [place] In-house Referral: NA Discharge Planning Services: CM Consult Post Acute Care Choice: [place] Living arrangements for the past 2 months: Single Family Home                 DME Arranged: N/A DME Agency: NA       HH Arranged: NA HH Agency: NA         Social Determinants of Health (SDOH) Interventions    Readmission Risk Interventions Readmission Risk Prevention Plan 01/18/2020 04/26/2019 11/04/2018  Transportation Screening Complete Complete Complete  Medication Review Press photographer) Complete Complete Complete  PCP or Specialist appointment within 3-5 days of discharge Complete Not Complete Complete  PCP/Specialist Appt Not Complete comments - pending medical stability -  [place] or Home Care Consult Complete Complete Complete  SW Recovery Care/Counseling Consult Complete Complete Complete  Palliative Care Screening Not Applicable Not Complete Not Applicable  Comments - may be appropriate, will ask MD -  [place] Not Applicable Patient Refused Not Applicable  Some recent data might be hidden

## 2020-07-19 NOTE — Plan of Care (Signed)
  Problem: Education: Goal: Knowledge of disease or condition will improve Outcome: Progressing   Problem: Activity: Goal: Ability to tolerate increased activity will improve Outcome: Progressing   Problem: Cardiac: Goal: Ability to achieve and maintain adequate cardiopulmonary perfusion will improve Outcome: Progressing

## 2020-07-19 NOTE — Progress Notes (Signed)
Physical Therapy Treatment Patient Details Name: Paul [name] MRN: [id number] DOB: Oct 24, 1955 Today's Date: 07/19/2020    History of Present Illness 65 yo male presenting to ED via EMS after missing dialysis and weakness with COVID-19 +. Off COVID precautions on 2/3. PMH including HTN, DM2, renal transplant, CKD st IV, calciphylaxis, A-Fib, bil. TTAs, bilateral BKA with prosthesis,and TIA.    PT Comments    Patient received in bed, very pleasant and cooperative, agreeable to vestibular assessment today. Had increased dizziness with rolling especially to the right side today, although was symptomatic when rolling to the left as well. Posterior canal testing negative bilaterally. Noted purely horizontal nystagmus with no rotation and with R beat (difficult to tell if it was geotrophic or ageotrophic), and performed barbecue roll for potential R horizontal canal BPPV with good tolerance and results. Had no dizziness with rolling side to side for repositioning of chuck pads after this maneuver. Discussed implications of findings as well as vestibular information for ongoing therapy f/u at SNF, also educated on meclizine and short term use if necessary moving forward. Left in bed with all needs met, bed alarm active.    Follow Up Recommendations  SNF;Supervision for mobility/OOB     Equipment Recommendations  Rolling walker with 5" wheels;3in1 (PT);Wheelchair (measurements PT);Wheelchair cushion (measurements PT)    Recommendations for Other Services       Precautions / Restrictions Precautions Precautions: Fall;Other (comment) Precaution Comments: B BKA with prosthetics in room now, mod fall, possible R horizontal canal BPPV Restrictions Weight Bearing Restrictions: No    Mobility  Bed Mobility Overal bed mobility: Needs Assistance Bed Mobility: Rolling;Supine to Sit;Sit to Supine Rolling: Mod assist   Supine to sit: Mod assist;+2 for physical assistance Sit to supine: Mod assist;+2  for physical assistance   General bed mobility comments: able to roll side to side with mod(I) at beginning of session, increased to [place] for getting on his stomach and working completely through barbecue roll maneuver due to fatigue    Transfers                 General transfer comment: deferred- vestibular focus  Ambulation/Gait             General Gait Details: deferred- vestibular focus   Stairs             Wheelchair Mobility    Modified Rankin (Stroke Patients Only)       Balance Overall balance assessment: Needs assistance Sitting-balance support: Feet unsupported;Single extremity supported Sitting balance-Leahy Scale: Poor Sitting balance - Comments: Needed min guard for donning prosthetics in sitting, reliant on UE support Postural control: Posterior lean                                  Cognition Arousal/Alertness: Awake/alert Behavior During Therapy: Flat affect Overall Cognitive Status: Impaired/Different from baseline                     Current Attention Level: Sustained   Following Commands: Follows multi-step commands with increased time;Follows one step commands consistently Safety/Judgement: Decreased awareness of safety;Decreased awareness of deficits Awareness: Emergent Problem Solving: Slow processing;Requires verbal cues General Comments: very pleasant and cooperative, still with reduced processing time and impaired problem solving      Exercises      General Comments General comments (skin integrity, edema, etc.): in Afib 100-124BPM      Pertinent  Vitals/Pain Pain Assessment: Faces Faces Pain Scale: Hurts little more Pain Location: generalized discomfort Pain Descriptors / Indicators: Discomfort;Constant Pain Intervention(s): Limited activity within patient's tolerance;Monitored during session    Home Living                      Prior Function            PT Goals (current goals  can now be found in the care plan section) Acute Rehab PT Goals Patient Stated Goal: feel better PT Goal Formulation: With patient Time For Goal Achievement: 07/11/20 Potential to Achieve Goals: Fair Progress towards PT goals: Progressing toward goals    Frequency    Min 2X/week      PT Plan Current plan remains appropriate    Co-evaluation              AM-PAC PT "6 Clicks" Mobility   Outcome Measure  Help needed turning from your back to your side while in a flat bed without using bedrails?: None Help needed moving from lying on your back to sitting on the side of a flat bed without using bedrails?: A Lot Help needed moving to and from a bed to a chair (including a wheelchair)?: A Lot Help needed standing up from a chair using your arms (e.g., wheelchair or bedside chair)?: A Lot Help needed to walk in hospital room?: A Lot Help needed climbing 3-5 steps with a railing? : A Lot 6 Click Score: 14    End of Session   Activity Tolerance: Patient tolerated treatment well Patient left: with call bell/phone within reach;in bed;with bed alarm set Nurse Communication: Mobility status PT Visit Diagnosis: Muscle weakness (generalized) (M62.81);Other abnormalities of gait and mobility (R26.89)     Time: 1456-1550 PT Time Calculation (min) (ACUTE ONLY): 54 min  Charges:  $Therapeutic Activity: 8-22 mins $Neuromuscular Re-education: 23-37 mins $Self Care/Home Management: 8-22                     [name], DPT, PN1   Supplemental Physical Therapist [place]    Pager [phone] Acute Rehab Office [phone]

## 2020-07-19 NOTE — Progress Notes (Signed)
PROGRESS NOTE    Paul [name]   SAY:[id number]  DOB: 12/12/1955  DOA: 06/11/2020     38  PCP: [name], MD  CC: weakness, SOB  Hospital Course: Paul [name] is a 65 y.o. male with PMH significant for atrial fibrillation on Eliquis, ESRD with failed renal transplant, on dialysis, TTS, diabetes mellitus type 2, chronic diastolic heart failure. Patient presented to the ED on 06/11/2020 with complaints of dyspnea in setting of missed dialysis.  During the course of hospitalization, he was treated for Covid pneumonia, Enterococcus bacteremia.  2D echo showed severely reduced EF.  At this time, patient is pending placement to SNF.  Interval History:  No events overnight. Appears that his reduced EF has been a topic of concern with him and his wife. I reviewed the echo findings from January with him and explained them; he was wanting to be evaluated by cardiology while here and consult was placed today as well.   ROS: Constitutional: negative for chills and fevers, Respiratory: negative for cough, Cardiovascular: negative for chest pain and Gastrointestinal: negative for abdominal pain  Assessment & Plan: Acute respiratory failure with hypoxemia -secondary to fluid overload, resolved with hemodialysis. -Back on low-flow oxygen.  No new symptoms.  Wean down as tolerated.  Chronic combined systolic/diastolic CHF - possible acute component with fluid overload issues but is an HD patient as well; previous echo in 2020 had normal EF and on this admission EF has significantly dropped (only change in health patient notes is new afib since then); other risk factors include PAD, HTN, HLD, DMII - follows outpatient with cardiology; patient requesting consult while here - EF 23-55%, Gr II diastolic dysfunction   Atrial flutter Essential hypertension - continue Toprol  -Continue to monitor in telemetry. -Continue Eliquis for anticoagulation  ESRD-HD-TTS Chronic metabolic  acidosis Failed renal transplant. -Continue immunosuppression with tacrolimus, prednisone.   -Continue Sevelamer, sodium bicarbonate -Nephrology following.  Sepsis due to bacteremia/Enterococcus - resolved  -patient had Enterococcus bacteremia as the likely urinary source.  -2D echo showed no evidence of vegetation.  -Previous hospitalist Dr. Olevia Bowens discussed with ID who recommended 2 weeks of IV antibiotics. Antibiotics course has been completed in the hospital.   Acute metabolic encephalopathy - resolved  -Secondary to sepsis, resolved   COVID-19 pneumonia - resolved  -Treated with remdesivir 5 days, steroids weaned off.  -Patient has completed isolation. Symptoms have resolved.  Diabetes mellitus type 2 Hypoglycemia --A1c 7.5 on 1/24 - continue Lantus and SSI  Hyperlipidemia -continue Lipitor  Depression -continue Zoloft  PAD - s/p B/L BKA's - continue asa, eliquis, lipitor    Old records reviewed in assessment of this patient  Antimicrobials: Completed  DVT prophylaxis:  apixaban (ELIQUIS) tablet 5 mg   Code Status:   Code Status: Full Code Family Communication: none present  Disposition Plan: Status is: Inpatient  Remains inpatient appropriate because:Unsafe d/c plan and Inpatient level of care appropriate due to severity of illness   Dispo:  Patient From:    Planned Disposition: [place]  Medically stable for discharge:      Risk of unplanned readmission score: Unplanned Admission- Pilot do not use: 55.6   Objective: Blood pressure 127/77, pulse (!) 104, temperature 97.9 F (36.6 C), resp. rate 18, height 6' (1.829 m), weight 66.2 kg, SpO2 100 %.  Examination: General appearance: alert, cooperative and no distress Head: Normocephalic, without obvious abnormality, atraumatic Eyes: EOMI Lungs: clear to auscultation bilaterally Heart: irregularly irregular rhythm and S1, S2  normal Abdomen: normal findings: bowel sounds  normal and soft, non-tender Extremities: B/L BKA amputations noted Skin: mobility and turgor normal Neurologic: Grossly normal  Consultants:   Nephrology  Cardiology  Procedures:   n/a  Data Reviewed: I have personally reviewed following labs and imaging studies Results for orders placed or performed during the hospital encounter of 06/11/20 (from the past 24 hour(s))  Glucose, capillary     Status: Abnormal   Collection Time: 07/18/20  4:58 PM  Result Value Ref Range   Glucose-Capillary 369 (H) 70 - 99 mg/dL  Glucose, capillary     Status: Abnormal   Collection Time: 07/18/20  8:17 PM  Result Value Ref Range   Glucose-Capillary 309 (H) 70 - 99 mg/dL  CBC with Differential/Platelet     Status: Abnormal   Collection Time: 07/19/20  6:55 AM  Result Value Ref Range   WBC 8.9 4.0 - 10.5 K/uL   RBC 3.10 (L) 4.22 - 5.81 MIL/uL   Hemoglobin 8.7 (L) 13.0 - 17.0 g/dL   HCT 28.7 (L) 39.0 - 52.0 %   MCV 92.6 80.0 - 100.0 fL   MCH 28.1 26.0 - 34.0 pg   MCHC 30.3 30.0 - 36.0 g/dL   RDW 17.2 (H) 11.5 - 15.5 %   Platelets 310 150 - 400 K/uL   nRBC 0.0 0.0 - 0.2 %   Neutrophils Relative % 69 %   Neutro Abs 6.2 1.7 - 7.7 K/uL   Lymphocytes Relative 18 %   Lymphs Abs 1.6 0.7 - 4.0 K/uL   Monocytes Relative 11 %   Monocytes Absolute 1.0 0.1 - 1.0 K/uL   Eosinophils Relative 1 %   Eosinophils Absolute 0.1 0.0 - 0.5 K/uL   Basophils Relative 0 %   Basophils Absolute 0.0 0.0 - 0.1 K/uL   Immature Granulocytes 1 %   Abs Immature Granulocytes 0.09 (H) 0.00 - 0.07 K/uL  Basic metabolic panel     Status: Abnormal   Collection Time: 07/19/20  6:55 AM  Result Value Ref Range   Sodium 134 (L) 135 - 145 mmol/L   Potassium 3.4 (L) 3.5 - 5.1 mmol/L   Chloride 99 98 - 111 mmol/L   CO2 23 22 - 32 mmol/L   Glucose, Bld 238 (H) 70 - 99 mg/dL   BUN 77 (H) 8 - 23 mg/dL   Creatinine, Ser 4.08 (H) 0.61 - 1.24 mg/dL   Calcium 8.2 (L) 8.9 - 10.3 mg/dL   GFR, Estimated 15 (L) >60 mL/min   Anion  gap 12 5 - 15  Glucose, capillary     Status: Abnormal   Collection Time: 07/19/20  6:55 AM  Result Value Ref Range   Glucose-Capillary 230 (H) 70 - 99 mg/dL  Glucose, capillary     Status: Abnormal   Collection Time: 07/19/20 11:27 AM  Result Value Ref Range   Glucose-Capillary 211 (H) 70 - 99 mg/dL    No results found for this or any previous visit (from the past 240 hour(s)).   Radiology Studies: No results found. DG CHEST PORT 1 VIEW  Final Result    CT ABDOMEN PELVIS WO CONTRAST  Final Result    DG Swallowing Func-Speech Pathology  Final Result    CT HEAD WO CONTRAST  Final Result    DG Chest 1 View  Final Result      Scheduled Meds: . (feeding supplement) PROSource Plus  30 mL Oral BID BM  . apixaban  5 mg Oral BID  .  aspirin  81 mg Oral Daily  . atorvastatin  10 mg Oral Q M,W,F  . Chlorhexidine Gluconate Cloth  6 each Topical Q0600  . darbepoetin (ARANESP) injection - DIALYSIS  150 mcg Intravenous Q Tue-HD  . doxercalciferol  2 mcg Intravenous Q T,Th,Sa-HD  . feeding supplement (NEPRO CARB STEADY)  237 mL Oral TID WC  . insulin aspart  0-6 Units Subcutaneous TID WC  . insulin glargine  10 Units Subcutaneous Daily  . lipase/protease/amylase  12,000 Units Oral TID AC  . magnesium chloride  2 tablet Oral BID  . metoprolol succinate  50 mg Oral Daily  . pantoprazole  80 mg Oral Daily  . predniSONE  5 mg Oral Q breakfast  . sertraline  50 mg Oral QHS  . sevelamer carbonate  800 mg Oral TID WC  . sodium chloride flush  3 mL Intravenous Q12H  . tacrolimus  1 mg Oral BID  . tamsulosin  0.4 mg Oral QPC supper   PRN Meds: sodium chloride, acetaminophen, chlorpheniramine-HYDROcodone, cyclobenzaprine, guaiFENesin-dextromethorphan, Ipratropium-Albuterol, polyethylene glycol, sodium chloride flush, traMADol Continuous Infusions: . sodium chloride       LOS: 38 days  Time spent: Greater than 50% of the 35 minute visit was spent in counseling/coordination of care for  the patient as laid out in the A&P.   Dwyane Dee, MD [facility] 07/19/2020, 1:32 PM

## 2020-07-19 NOTE — Progress Notes (Signed)
Cardiology Progress Note  Patient ID: Paul [name] MRN: [id number] DOB: 1956/01/24 Date of Encounter: 07/19/2020  Primary Cardiologist: Evalina Field, MD  Subjective   Chief Complaint: Congestive heart failure  HPI: Extensive hospitalization with COVID-19 pneumonia, enteric coccus bacteremia, new onset systolic heart failure, recurrence of atrial fibrillation atrial flutter.  Cardiology reinvolved in care to address systolic heart failure and atrial fibrillation.  Appears to be rate controlled A. fib.  Feeling better.  Denies any symptoms.  No chest pain or shortness of breath.  Has had noncardiac chest pain while here.  None recently.  ROS:  All other ROS reviewed and negative. Pertinent positives noted in the HPI.     Inpatient Medications  Scheduled Meds: . (feeding supplement) PROSource Plus  30 mL Oral BID BM  . apixaban  5 mg Oral BID  . aspirin  81 mg Oral Daily  . atorvastatin  10 mg Oral Q M,W,F  . Chlorhexidine Gluconate Cloth  6 each Topical Q0600  . darbepoetin (ARANESP) injection - DIALYSIS  150 mcg Intravenous Q Tue-HD  . doxercalciferol  2 mcg Intravenous Q T,Th,Sa-HD  . feeding supplement (NEPRO CARB STEADY)  237 mL Oral TID WC  . hydrALAZINE  10 mg Oral Q8H  . insulin aspart  0-6 Units Subcutaneous TID WC  . insulin glargine  10 Units Subcutaneous Daily  . isosorbide mononitrate  15 mg Oral Daily  . lipase/protease/amylase  12,000 Units Oral TID AC  . magnesium chloride  2 tablet Oral BID  . metoprolol succinate  50 mg Oral Daily  . pantoprazole  80 mg Oral Daily  . predniSONE  5 mg Oral Q breakfast  . sertraline  50 mg Oral QHS  . sevelamer carbonate  800 mg Oral TID WC  . sodium chloride flush  3 mL Intravenous Q12H  . tacrolimus  1 mg Oral BID  . tamsulosin  0.4 mg Oral QPC supper   Continuous Infusions: . sodium chloride     PRN Meds: sodium chloride, acetaminophen, chlorpheniramine-HYDROcodone, cyclobenzaprine, guaiFENesin-dextromethorphan,  Ipratropium-Albuterol, polyethylene glycol, sodium chloride flush, traMADol   Vital Signs   Vitals:   07/18/20 1716 07/18/20 2015 07/19/20 0452 07/19/20 0858  BP: 126/80 113/80 106/75 127/77  Pulse: 90 (!) 102 80 (!) 104  Resp: 18 16 18 18   Temp: 98.9 F (37.2 C) 99.8 F (37.7 C) 98.5 F (36.9 C) 97.9 F (36.6 C)  TempSrc:  Oral Oral   SpO2: 99% 99% 95% 100%  Weight:      Height:        Intake/Output Summary (Last 24 hours) at 07/19/2020 1422 Last data filed at 07/19/2020 1216 Gross per 24 hour  Intake 1074 ml  Output --  Net 1074 ml   Last 3 Weights 07/18/2020 07/18/2020 07/17/2020  Weight (lbs) 145 lb 15.1 oz 148 lb 9.4 oz 143 lb 1.3 oz  Weight (kg) 66.2 kg 67.4 kg 64.9 kg      Telemetry  Overnight telemetry shows atrial fibrillation with heart rates around 100 bpm, which I personally reviewed.   ECG  The most recent ECG shows atrial fibrillation heart rate 112, no acute ischemic changes or evidence of infarction, which I personally reviewed.   Physical Exam   Vitals:   07/18/20 1716 07/18/20 2015 07/19/20 0452 07/19/20 0858  BP: 126/80 113/80 106/75 127/77  Pulse: 90 (!) 102 80 (!) 104  Resp: 18 16 18 18   Temp: 98.9 F (37.2 C) 99.8 F (37.7 C) 98.5 F (36.9 C)  97.9 F (36.6 C)  TempSrc:  Oral Oral   SpO2: 99% 99% 95% 100%  Weight:      Height:         Intake/Output Summary (Last 24 hours) at 07/19/2020 1422 Last data filed at 07/19/2020 1216 Gross per 24 hour  Intake 1074 ml  Output --  Net 1074 ml    Last 3 Weights 07/18/2020 07/18/2020 07/17/2020  Weight (lbs) 145 lb 15.1 oz 148 lb 9.4 oz 143 lb 1.3 oz  Weight (kg) 66.2 kg 67.4 kg 64.9 kg    Body mass index is 19.79 kg/m.   General: Well nourished, well developed, in no acute distress Head: Atraumatic, normal size  Eyes: PEERLA, EOMI  Neck: Supple, no JVD Endocrine: No thryomegaly Cardiac: Normal S1, S2; irregular rhythm, 2 out of 6 systolic ejection murmur Lungs: Clear to auscultation bilaterally, no  wheezing, rhonchi or rales  Abd: Soft, nontender, no hepatomegaly  Ext: No edema, pulses 2+, status post bilateral BKA's Musculoskeletal: No deformities, BUE and BLE strength normal and equal Skin: Warm and dry, no rashes   Neuro: Alert and oriented to person, place, time, and situation, CNII-XII grossly intact, no focal deficits  Psych: Normal mood and affect   Labs  High Sensitivity Troponin:   Recent Labs  Lab 07/02/20 2201 07/03/20 0000  TROPONINIHS 30* 31*     Cardiac EnzymesNo results for input(s): TROPONINI in the last 168 hours. No results for input(s): TROPIPOC in the last 168 hours.  Chemistry Recent Labs  Lab 07/13/20 0624 07/15/20 0805 07/18/20 0800 07/19/20 0655  NA 132* 134* 132* 134*  K 3.9 4.2 3.8 3.4*  CL 97* 100 99 99  CO2 22 22 20* 23  GLUCOSE 201* 204* 232* 238*  BUN 77* 78* 104* 77*  CREATININE 4.50* 4.63* 5.05* 4.08*  CALCIUM 8.3* 8.3* 8.2* 8.2*  ALBUMIN 1.7* 1.7* 1.7*  --   GFRNONAA 14* 13* 12* 15*  ANIONGAP 13 12 13 12     Hematology Recent Labs  Lab 07/15/20 0804 07/18/20 0800 07/19/20 0655  WBC 7.7 9.3 8.9  RBC 3.12* 2.98* 3.10*  HGB 8.7* 8.5* 8.7*  HCT 29.1* 28.0* 28.7*  MCV 93.3 94.0 92.6  MCH 27.9 28.5 28.1  MCHC 29.9* 30.4 30.3  RDW 17.2* 17.2* 17.2*  PLT 360 346 310   BNPNo results for input(s): BNP, PROBNP in the last 168 hours.  DDimer No results for input(s): DDIMER in the last 168 hours.   Radiology  No results found.  Cardiac Studies  TTE 06/13/2020 1. Limited study to assess LV function; not all views obtained.  2. Left ventricular ejection fraction, by estimation, is 25 to 30%. The  left ventricle has severely decreased function. The left ventricle  demonstrates global hypokinesis. Left ventricular diastolic parameters are  consistent with Grade II diastolic  dysfunction (pseudonormalization).  3. Right ventricular systolic function is mildly reduced. The right  ventricular size is normal. There is normal  pulmonary artery systolic  pressure.  4. Left atrial size was moderately dilated.  5. Right atrial size was mildly dilated.  6. Moderate pleural effusion in the left lateral region.  7. The mitral valve is normal in structure. Mild mitral valve  regurgitation. No evidence of mitral stenosis.  8. The aortic valve is tricuspid. Aortic valve regurgitation is mild.  Mild to moderate aortic valve sclerosis/calcification is present, without  any evidence of aortic stenosis.  9. The inferior vena cava is normal in size with greater than 50%  respiratory variability, suggesting right atrial pressure of 3 mmHg.   Patient Profile  Paul [name] is a 65 y.o. male with ESRD status post failed renal transplant (back on hemodialysis 01/2020), PAD status post bilateral BKA, stroke/TIA, persistent atrial fibrillation, diabetes, hypertension, mild aortic stenosis who was admitted on 06/11/2020 for COVID-19 pneumonia.  Cardiology was initially consulted for new onset systolic heart failure and atrial flutter.  He was treated medically and has had an extensive hospitalization.  Cardiology reconsulted on 07/19/2020 for atrial fibrillation and congestive heart failure.  Assessment & Plan   1.  New onset systolic heart failure, EF 25-30% -Admitted with COVID-19 pneumonia.  Course complicated by enteric coccus bacteremia.  Cardiomyopathy could be due to sepsis or Covid.  Was also noted to be in atrial flutter.  This could be an arrhythmia induced cardiomyopathy. -Back in atrial fibrillation. -He has had an extensive hospitalization nearly 38 days.  He reports he may be going to rehab tomorrow. -We discussed that rhythm control be the best strategy.  It appears he developed A. fib between 2/13 and 07/03/2020.  Has been in A. fib for what I can tell since that time. -Given his extensive hospitalization I do not want to delay his discharge for rhythm control.  In the interim would recommend to continue with rate  control. -We will continue his metoprolol succinate 50 mg daily. -No ACE/ARB/Arni due to ESRD.  I have added back hydralazine 10 mg 3 times daily and Imdur 15 mg daily. -Volume control per hemodialysis. -He appears optimized.  Heart rates could be better.  See discussion below.  Likely will plan for outpatient TEE/cardioversion.  2.  Atrial fibrillation/flutter -He has had paroxysmal flutter and paroxysmal atrial fibrillation this admission.  On 07/02/2020 was noted to be in normal sinus rhythm.  After that time has been in atrial fibrillation for what I can tell. -In the setting of systolic heart failure rhythm control strategy would be preferred. -He did miss his Eliquis dose on the morning of 07/06/2020.  If he had not missed this we could just perform a cardioversion tomorrow.  He reports he may be going home.  He will need a TEE/cardioversion with likely amiodarone for rhythm control.  He may ultimately end up being an ablation candidate but not now.  I would prefer to get him back in rhythm but I do not want to delay his discharge.  I have asked hospital medicine to determine the timing of his discharge.  If he will not go home we will plan for TEE/cardioversion tomorrow.  If he is going to be discharged we will just continue with rate control strategy until I see him in follow-up.  -Please continue Eliquis 5 mg twice daily.  Ensure no missed doses.  For questions or updates, please contact [place] Please consult [email or web address] for contact info under   Time Spent with Patient: I have spent a total of 35 minutes with patient reviewing hospital notes, telemetry, EKGs, labs and examining the patient as well as establishing an assessment and plan that was discussed with the patient.  > 50% of time was spent in direct patient care.    Signed, Addison Naegeli. Audie Box, [place]  07/19/2020 2:22 PM

## 2020-07-19 NOTE — Progress Notes (Addendum)
[facility] Progress Note   Subjective:     Patient seen and examined at bedside today. [name] also at bedside. Patient reports feeling well today. O2 via [place] in place as needed. Denies SOB, CP, ABD pain, N/V/D. Patient is awaiting placement. HD yest- weight down 1.2 kg.  Wife concerned about dec EF-  Discussed   Objective Vitals:   07/18/20 1716 07/18/20 2015 07/19/20 0452 07/19/20 0858  BP: 126/80 113/80 106/75 127/77  Pulse: 90 (!) 102 80 (!) 104  Resp: 18 16 18 18   Temp: 98.9 F (37.2 C) 99.8 F (37.7 C) 98.5 F (36.9 C) 97.9 F (36.6 C)  TempSrc:  Oral Oral   SpO2: 99% 99% 95% 100%  Weight:      Height:       Physical Exam General: Chronically ill male; Appears comfortable; NAD; On 2L via [place] for comfort Heart: Irregular  no murmur or friction rub Lungs:CTA anteriorly, no wheezing, rales, or rhonchi Abdomen: Soft, round, non-tender, non-distended Extremities:B BKA; no edema bilateral hips and lower extremities Dialysis Access:R AVF (+) Bruit/Thrill   Filed Weights   07/17/20 0500 07/18/20 0731 07/18/20 1031  Weight: 64.9 kg 67.4 kg 66.2 kg    Intake/Output Summary (Last 24 hours) at 07/19/2020 1009 Last data filed at 07/19/2020 0600 Gross per 24 hour  Intake 954 ml  Output --  Net 954 ml    Additional Objective Labs: Basic Metabolic Panel: Recent Labs  Lab 07/13/20 0624 07/15/20 0805 07/18/20 0800 07/19/20 0655  NA 132* 134* 132* 134*  K 3.9 4.2 3.8 3.4*  CL 97* 100 99 99  CO2 22 22 20* 23  GLUCOSE 201* 204* 232* 238*  BUN 77* 78* 104* 77*  CREATININE 4.50* 4.63* 5.05* 4.08*  CALCIUM 8.3* 8.3* 8.2* 8.2*  PHOS 5.2* 5.2* 5.5*  --    Liver Function Tests: Recent Labs  Lab 07/13/20 0624 07/15/20 0805 07/18/20 0800  ALBUMIN 1.7* 1.7* 1.7*   CBC: Recent Labs  Lab 07/13/20 0624 07/15/20 0804 07/18/20 0800 07/19/20 0655  WBC 6.2 7.7 9.3 8.9  NEUTROABS  --   --   --  6.2  HGB 8.5* 8.7* 8.5* 8.7*  HCT 27.0* 29.1*  28.0* 28.7*  MCV 92.2 93.3 94.0 92.6  PLT 271 360 346 310   Blood Culture    Component Value Date/Time   SDES BLOOD LEFT HAND 06/13/2020 0803   SDES BLOOD LEFT HAND 06/13/2020 0803   SPECREQUEST  06/13/2020 0803    BOTTLES DRAWN AEROBIC AND ANAEROBIC Blood Culture adequate volume   SPECREQUEST  06/13/2020 0803    BOTTLES DRAWN AEROBIC AND ANAEROBIC Blood Culture adequate volume   CULT  06/13/2020 0803    NO GROWTH 5 DAYS Performed at [facility], [place] [address] [name], [place] [address]    CULT  06/13/2020 0803    NO GROWTH 5 DAYS Performed at [facility] [address] [place], [place] [address]    REPTSTATUS 06/18/2020 FINAL 06/13/2020 0803   REPTSTATUS 06/18/2020 FINAL 06/13/2020 0803    CBG: Recent Labs  Lab 07/17/20 2201 07/18/20 1120 07/18/20 1658 07/18/20 2017 07/19/20 0655  GLUCAP 303* 174* 369* 309* 230*    Lab Results  Component Value Date   INR 1.2 01/04/2020   INR 1.2 10/28/2018   INR 1.25 06/14/2013   Medications: . sodium chloride     . (feeding supplement) PROSource Plus  30 mL Oral BID BM  . apixaban  5 mg  Oral BID  . aspirin  81 mg Oral Daily  . atorvastatin  10 mg Oral Q M,W,F  . Chlorhexidine Gluconate Cloth  6 each Topical Q0600  . darbepoetin (ARANESP) injection - DIALYSIS  150 mcg Intravenous Q Tue-HD  . doxercalciferol  2 mcg Intravenous Q T,Th,Sa-HD  . feeding supplement (NEPRO CARB STEADY)  237 mL Oral TID WC  . insulin aspart  0-6 Units Subcutaneous TID WC  . insulin glargine  10 Units Subcutaneous Daily  . lipase/protease/amylase  12,000 Units Oral TID AC  . magnesium chloride  2 tablet Oral BID  . metoprolol succinate  50 mg Oral Daily  . pantoprazole  80 mg Oral Daily  . predniSONE  5 mg Oral Q breakfast  . sertraline  50 mg Oral QHS  . sevelamer carbonate  800 mg Oral TID WC  . sodium chloride flush  3 mL Intravenous Q12H  . tacrolimus  1 mg Oral BID  . tamsulosin  0.4 mg Oral QPC supper    Dialysis  Orders: [facility] at [facility] 3:30hr, 400/800, EDW 66kg, 3K/2.25Ca, AVF, no heparin  - Hectoral 15mcg Iv q HD - Mircera 268mcg IV q 2 weeks (last given 1/13 - Hgb 10.9 at the time)  Assessment/Plan: 1. Sepsis 2/2 Enterococcus bacteremia(resolved): Completed antibiotics course. Echo without vegetations. 2. COVID-19/PNA(resolved): Tested (+) 06/11/20, off isolation. 3.ESRD:on  HD  TTS schedule. BUN pre-HD 104. If patient still here later this week may need longer treatment. Remains on low dose IS for prior kidney transplant.Prograf recently decreased to 1mg  BID and eventually wean to off.Try Recliner Hd next tx  4. Anemiaof CKD: Hgb 8.7 - continueAranespq Tuesday,dose recently increased to 149mcg weekly. Will monitor trends. 5. Secondary hyperparathyroidism- Ca/Phos at goal- . Continue VDRA- hectoroland renvela. 6.HTN/volume:Noted episodes of soft BP; per Hospitalist, imdur on hold. Continue metoprolol- no evidence of fluid volume overload at this time, relatively low goal. 7. Nutrition-Alb remains very low,1.7 continue protein supplements. 8. Tachy/brady syndrome- HxAFib/RVR. - On metoprolol, Eliquis. Not symptomatic. Monitor.  9. Dispo:Per Hospitalist note, [facility].  Tobie Poet, NP [facility] 07/19/2020,10:09 AM  LOS: 38 days   Patient seen and examined, agree with above note with above modifications. No new complaints, tolerated HD well yesterday -  Cont with routine  HD-  Planning for tomorrow-  Want to run full 3.5 hours-  Appropriate adjustment of HD related meds  Corliss Parish, MD 07/19/2020

## 2020-07-20 DIAGNOSIS — I5042 Chronic combined systolic (congestive) and diastolic (congestive) heart failure: Secondary | ICD-10-CM

## 2020-07-20 LAB — RENAL FUNCTION PANEL
Albumin: 1.6 g/dL — ABNORMAL LOW (ref 3.5–5.0)
Albumin: 1.6 g/dL — ABNORMAL LOW (ref 3.5–5.0)
Anion gap: 13 (ref 5–15)
Anion gap: 14 (ref 5–15)
BUN: 98 mg/dL — ABNORMAL HIGH (ref 8–23)
BUN: 104 mg/dL — ABNORMAL HIGH (ref 8–23)
CO2: 20 mmol/L — ABNORMAL LOW (ref 22–32)
CO2: 20 mmol/L — ABNORMAL LOW (ref 22–32)
Calcium: 8.3 mg/dL — ABNORMAL LOW (ref 8.9–10.3)
Calcium: 8.3 mg/dL — ABNORMAL LOW (ref 8.9–10.3)
Chloride: 99 mmol/L (ref 98–111)
Chloride: 99 mmol/L (ref 98–111)
Creatinine, Ser: 4.76 mg/dL — ABNORMAL HIGH (ref 0.61–1.24)
Creatinine, Ser: 4.85 mg/dL — ABNORMAL HIGH (ref 0.61–1.24)
GFR, Estimated: 13 mL/min — ABNORMAL LOW (ref >60)
GFR, Estimated: 12 mL/min — ABNORMAL LOW (ref >60)
Glucose, Bld: 187 mg/dL — ABNORMAL HIGH (ref 70–99)
Glucose, Bld: 179 mg/dL — ABNORMAL HIGH (ref 70–99)
Phosphorus: 5 mg/dL — ABNORMAL HIGH (ref 2.5–4.6)
Phosphorus: 5.2 mg/dL — ABNORMAL HIGH (ref 2.5–4.6)
Potassium: 3.6 mmol/L (ref 3.5–5.1)
Potassium: 3.8 mmol/L (ref 3.5–5.1)
Sodium: 132 mmol/L — ABNORMAL LOW (ref 135–145)
Sodium: 133 mmol/L — ABNORMAL LOW (ref 135–145)

## 2020-07-20 LAB — SARS CORONAVIRUS 2 (TAT 6-24 HRS): SARS Coronavirus 2: NEGATIVE

## 2020-07-20 LAB — CBC
HCT: 26.7 % — ABNORMAL LOW (ref 39.0–52.0)
Hemoglobin: 8.3 g/dL — ABNORMAL LOW (ref 13.0–17.0)
MCH: 28.7 pg (ref 26.0–34.0)
MCHC: 31.1 g/dL (ref 30.0–36.0)
MCV: 92.4 fL (ref 80.0–100.0)
Platelets: 331 10*3/uL (ref 150–400)
RBC: 2.89 MIL/uL — ABNORMAL LOW (ref 4.22–5.81)
RDW: 17.2 % — ABNORMAL HIGH (ref 11.5–15.5)
WBC: 9.2 10*3/uL (ref 4.0–10.5)
nRBC: 0 % (ref 0.0–0.2)

## 2020-07-20 LAB — GLUCOSE, CAPILLARY
Glucose-Capillary: 134 mg/dL — ABNORMAL HIGH (ref 70–99)
Glucose-Capillary: 184 mg/dL — ABNORMAL HIGH (ref 70–99)
Glucose-Capillary: 322 mg/dL — ABNORMAL HIGH (ref 70–99)

## 2020-07-20 LAB — MAGNESIUM: Magnesium: 2.1 mg/dL (ref 1.7–2.4)

## 2020-07-20 MED ORDER — HYDRALAZINE HCL 10 MG PO TABS: 5.0000 mg | ORAL_TABLET | Freq: Three times a day (TID) | ORAL | Status: AC

## 2020-07-20 MED ORDER — METOPROLOL SUCCINATE ER 50 MG PO TB24: 50.0000 mg | ORAL_TABLET | Freq: Every day | ORAL | Status: AC

## 2020-07-20 MED ORDER — SEVELAMER CARBONATE 800 MG PO TABS: 800.0000 mg | ORAL_TABLET | Freq: Three times a day (TID) | ORAL | Status: AC

## 2020-07-20 MED ORDER — DOXERCALCIFEROL 4 MCG/2ML IV SOLN
INTRAVENOUS | Status: AC
  Administered 2020-07-20: 2 ug via INTRAVENOUS
  Filled 2020-07-20: qty 2

## 2020-07-20 MED ORDER — ISOSORBIDE MONONITRATE ER 30 MG PO TB24: 15.0000 mg | ORAL_TABLET | Freq: Every day | ORAL | Status: AC

## 2020-07-20 NOTE — Discharge Summary (Signed)
Physician Discharge Summary   Paul [name] BJS:[id number] DOB: 1955-11-22 DOA: 06/11/2020  PCP: Paul Amel, MD  Admit date: 06/11/2020 Discharge date: 07/20/2020  Admitted From: home Disposition:  Paul [name] Discharging physician: Paul Dee, MD  Recommendations for Outpatient Follow-up:  1. Follow up with cardiology for new diagnosis CHF and possible cardioversion 2. BP low after HD on 3/3 and BP meds restarted back on 3/2, may need further adjustment (hydralazine dose reduced prior to discharge to 5 mg TID)  Patient discharged to SNF in Discharge Condition: stable Risk of unplanned readmission score: Unplanned Admission- Pilot do not use: 56.2  CODE STATUS: Full Diet recommendation:  Diet Orders (From admission, onward)    Start     Ordered   06/19/20 2212  Diet renal with fluid restriction Fluid restriction: 1200 mL Fluid; Room service appropriate? Yes; Fluid consistency: Thin  Diet effective now       Question Answer Comment  Fluid restriction: 1200 mL Fluid   Room service appropriate? Yes   Fluid consistency: Thin      06/19/20 2211          Hospital Course: Paul Mohr Bairdis a 65 y.o.malewith PMH significant for atrial fibrillation on Eliquis, ESRD with failed renal transplant, on dialysis, TTS, diabetes mellitus type 2, chronic diastolic heart failure. Patient presented to the ED on 06/11/2020 with complaints of dyspnea in setting of missed dialysis.  During the course of hospitalization, he was treated for Covid pneumonia, Enterococcus bacteremia.   See below for problem based plans.   Acute respiratory failure with hypoxemia -secondary to fluid overload, resolved with hemodialysis. -Back on low-flow oxygen. No new symptoms. Wean down as tolerated.  Chronic combined systolic/diastolic CHF - possible acute component with fluid overload issues but is an HD patient as well; previous echo in 2020 had normal EF and on this admission EF has significantly  dropped (only change in health patient notes is new afib since then); other risk factors include PAD, HTN, HLD, DMII - follows outpatient with cardiology - EF 60-73%, Gr II diastolic dysfunction  - evaluated by cardiology on 3/2 and 3/3; restarted on BP meds and will have close follow up   Atrial flutter Essential hypertension - continue Toprol  -Close outpatient follow-up with cardiology to discuss cardioversion -Continue Eliquis for anticoagulation  ESRD-HD-TTS Chronic metabolic acidosis Failed renal transplant. -Continue immunosuppression with tacrolimus, prednisone.  -Continue Sevelamer, sodium bicarbonate  Sepsis due to bacteremia/Enterococcus - resolved  -patient had Enterococcus bacteremiaas thelikely urinary source.  -2D echo showed no evidence of vegetation.  -Previous hospitalist Dr. Olevia Bowens discussed with ID who recommended 2 weeks of IV antibiotics. Antibiotics course has been completed in the hospital.   Acute metabolic encephalopathy - resolved  -Secondary to sepsis, resolved   COVID-19 pneumonia - resolved  -Treated with remdesivir 5 days, steroids weaned off.  -Patient has completed isolation. Symptoms have resolved.  Diabetes mellitus type 2 Hypoglycemia --A1c 7.5 on 1/24 - continue Lantus and SSI  Hyperlipidemia -continue Lipitor  Depression -continue Zoloft  PAD - s/p B/L BKA's - continue asa, eliquis, lipitor    Principal Diagnosis: Bacteremia due to Enterococcus  Discharge Diagnoses: Active Hospital Problems   Diagnosis Date Noted  . PAF (paroxysmal atrial fibrillation) ([place]) 06/11/2020  . Chronic combined systolic and diastolic CHF (congestive heart failure) ([place]) 06/11/2020  . GERD (gastroesophageal reflux disease) 04/03/2013  . DM (diabetes mellitus) ([place]) 12/28/2012  . ESRD on hemodialysis [facility]) 05/29/2011    Resolved Hospital Problems   Diagnosis  Date Noted Date Resolved  . Bacteremia due to Enterococcus 06/12/2020  07/20/2020  . COVID-19 virus infection 06/11/2020 07/20/2020    Discharge Instructions    Amb referral to AFIB Clinic   Complete by: As directed    Discharge wound care:   Complete by: As directed    Offloading as able and turning in bed   Increase activity slowly   Complete by: As directed      Allergies as of 07/20/2020      Reactions   Metformin And Related Other (See Comments)   Has kidney problems      Medication List    STOP taking these medications   amLODipine 10 MG tablet Commonly known as: NORVASC   bisacodyl 5 MG EC tablet Commonly known as: DULCOLAX   docusate sodium 100 MG capsule Commonly known as: COLACE   metoprolol tartrate 25 MG tablet Commonly known as: LOPRESSOR   mycophenolate 180 MG EC tablet Commonly known as: MYFORTIC   polyethylene glycol 17 g packet Commonly known as: MIRALAX / GLYCOLAX     TAKE these medications   acetaminophen 325 MG tablet Commonly known as: TYLENOL Take 2 tablets (650 mg total) by mouth every 6 (six) hours as needed for mild pain (or Fever >/= 101).   apixaban 5 MG Tabs tablet Commonly known as: Eliquis Take 1 tablet (5 mg total) by mouth 2 (two) times daily.   aspirin 81 MG chewable tablet Chew 81 mg by mouth daily.   atorvastatin 10 MG tablet Commonly known as: LIPITOR Take 1 tablet (10 mg total) by mouth every Monday, Wednesday, and Friday.   Basaglar KwikPen 100 UNIT/ML Inject 8 Units into the skin daily.   [employer] D3 50 MCG (2000 UT) Caps Generic drug: Cholecalciferol Take 2 capsules (4,000 Units total) by mouth daily.   Darbepoetin Alfa 60 MCG/0.3ML Sosy injection Commonly known as: ARANESP Inject 0.3 mLs (60 mcg total) into the vein every Thursday with hemodialysis.   HumaLOG KwikPen 100 UNIT/ML KwikPen Generic drug: insulin lispro Inject 2-3 Units into the skin 3 (three) times daily before meals.   hydrALAZINE 10 MG tablet Commonly known as: APRESOLINE Take 0.5 tablets (5 mg total) by mouth 3  (three) times daily.   isosorbide mononitrate 30 MG 24 hr tablet Commonly known as: IMDUR Take 0.5 tablets (15 mg total) by mouth daily.   lipase/protease/amylase 12000-38000 units Cpep capsule Commonly known as: CREON Take 1 capsule (12,000 Units total) by mouth 3 (three) times daily before meals.   metoprolol succinate 50 MG 24 hr tablet Commonly known as: TOPROL-XL Take 1 tablet (50 mg total) by mouth daily. Take with or immediately following a meal.   omeprazole 40 MG capsule Commonly known as: PRILOSEC Take 1 capsule (40 mg total) by mouth at bedtime.   predniSONE 5 MG tablet Commonly known as: DELTASONE Take 1 tablet (5 mg total) by mouth daily with breakfast.   sertraline 50 MG tablet Commonly known as: ZOLOFT Take 1 tablet (50 mg total) by mouth at bedtime. What changed: when to take this   sevelamer carbonate 800 MG tablet Commonly known as: RENVELA Take 1 tablet (800 mg total) by mouth 3 (three) times daily with meals.   Slow-Mag 64 MG Tbec SR tablet Generic drug: magnesium chloride Take 2 tablets (128 mg total) by mouth 2 (two) times daily. Take at least 2 hours separate from tacrolimus.   sodium bicarbonate 650 MG tablet Take 1,950 mg by mouth 4 (four) times daily.  tacrolimus 1 MG capsule Commonly known as: PROGRAF Take 2 capsules (2 mg total) by mouth 2 (two) times daily.   tamsulosin 0.4 MG Caps capsule Commonly known as: FLOMAX Take 1 capsule (0.4 mg total) by mouth daily after supper.            Discharge Care Instructions  (From admission, onward)         Start     Ordered   07/20/20 0000  Discharge wound care:       Comments: Offloading as able and turning in bed   07/20/20 1435          Follow-up Information    O'Neal, Cassie Freer, MD Follow up.   Specialties: Internal Medicine, Cardiology, Radiology Why: You already have a visit with Dr. Audie Box scheduled for 08/29/2020 at 10:20am. Please keep this appointment so we can  follow-up with you after this hospitalization.  Contact information: [place] [id number] [phone]              Allergies  Allergen Reactions  . Metformin And Related Other (See Comments)    Has kidney problems    Consultations: ID Cardiology Nephrology  Discharge Exam: BP (!) 83/56 (BP Location: Left Arm)   Pulse 86   Temp 97.8 F (36.6 C)   Resp 16   Ht 6' (1.829 m)   Wt 66.2 kg   SpO2 97%   BMI 19.79 kg/m  General appearance: alert, cooperative and no distress Head: Normocephalic, without obvious abnormality, atraumatic Eyes: EOMI Lungs: clear to auscultation bilaterally Heart: irregularly irregular rhythm and S1, S2 normal Abdomen: normal findings: bowel sounds normal and soft, non-tender Extremities: B/L BKA amputations noted Skin: mobility and turgor normal Neurologic: Grossly normal  The results of significant diagnostics from this hospitalization (including imaging, microbiology, ancillary and laboratory) are listed below for reference.   Microbiology: No results found for this or any previous visit (from the past 240 hour(s)).   Labs: BNP (last 3 results) Recent Labs    10/12/19 0011 06/11/20 1326  BNP 1,980.9* >4,496.7*   Basic Metabolic Panel: Recent Labs  Lab 07/15/20 0805 07/18/20 0800 07/19/20 0655 07/20/20 0313 07/20/20 0854  NA 134* 132* 134* 132* 133*  K 4.2 3.8 3.4* 3.6 3.8  CL 100 99 99 99 99  CO2 22 20* 23 20* 20*  GLUCOSE 204* 232* 238* 187* 179*  BUN 78* 104* 77* 98* 104*  CREATININE 4.63* 5.05* 4.08* 4.76* 4.85*  CALCIUM 8.3* 8.2* 8.2* 8.3* 8.3*  MG  --   --   --  2.1  --   PHOS 5.2* 5.5*  --  5.0* 5.2*   Liver Function Tests: Recent Labs  Lab 07/15/20 0805 07/18/20 0800 07/20/20 0313 07/20/20 0854  ALBUMIN 1.7* 1.7* 1.6* 1.6*   No results for input(s): LIPASE, AMYLASE in the last 168 hours. No results for input(s): AMMONIA in the last 168 hours. CBC: Recent Labs  Lab 07/15/20 0804  07/18/20 0800 07/19/20 0655 07/20/20 0854  WBC 7.7 9.3 8.9 9.2  NEUTROABS  --   --  6.2  --   HGB 8.7* 8.5* 8.7* 8.3*  HCT 29.1* 28.0* 28.7* 26.7*  MCV 93.3 94.0 92.6 92.4  PLT 360 346 310 331   Cardiac Enzymes: No results for input(s): CKTOTAL, CKMB, CKMBINDEX, TROPONINI in the last 168 hours. BNP: Invalid input(s): POCBNP CBG: Recent Labs  Lab 07/19/20 1127 07/19/20 1620 07/19/20 2127 07/20/20 0646 07/20/20 1315  GLUCAP 211* 242* 234* 134* 184*  D-Dimer No results for input(s): DDIMER in the last 72 hours. Hgb A1c No results for input(s): HGBA1C in the last 72 hours. Lipid Profile No results for input(s): CHOL, HDL, LDLCALC, TRIG, CHOLHDL, LDLDIRECT in the last 72 hours. Thyroid function studies No results for input(s): TSH, T4TOTAL, T3FREE, THYROIDAB in the last 72 hours.  Invalid input(s): FREET3 Anemia work up No results for input(s): VITAMINB12, FOLATE, FERRITIN, TIBC, IRON, RETICCTPCT in the last 72 hours. Urinalysis    Component Value Date/Time   COLORURINE AMBER (A) 06/12/2020 1038   APPEARANCEUR CLOUDY (A) 06/12/2020 1038   LABSPEC 1.014 06/12/2020 1038   PHURINE 5.0 06/12/2020 1038   GLUCOSEU 50 (A) 06/12/2020 1038   HGBUR MODERATE (A) 06/12/2020 1038   BILIRUBINUR NEGATIVE 06/12/2020 1038   KETONESUR 5 (A) 06/12/2020 1038   PROTEINUR >=300 (A) 06/12/2020 1038   UROBILINOGEN 0.2 08/02/2014 1424   NITRITE NEGATIVE 06/12/2020 1038   LEUKOCYTESUR LARGE (A) 06/12/2020 1038   Sepsis Labs Invalid input(s): PROCALCITONIN,  WBC,  LACTICIDVEN Microbiology No results found for this or any previous visit (from the past 240 hour(s)).  Procedures/Studies: CT ABDOMEN PELVIS WO CONTRAST  Result Date: 06/26/2020 CLINICAL DATA:  Abdominal mass, failed renal transplant, sepsis EXAM: CT ABDOMEN AND PELVIS WITHOUT CONTRAST TECHNIQUE: Multidetector CT imaging of the abdomen and pelvis was performed following the standard protocol without IV contrast. COMPARISON:   04/23/2019 FINDINGS: Lower chest: Moderate left pleural effusion, with dense left lower lobe consolidation likely atelectasis. Right chest is clear. Extensive coronary artery atherosclerosis. Hepatobiliary: Chronic pneumobilia related to prior choledocho jejunostomy. Gallbladder surgically absent. No focal liver abnormalities on this limited unenhanced exam. Pancreas: Pancreatic head is likely surgically absent. Visualized portions of the body and tail are atrophic. Spleen: Normal in size without focal abnormality. Adrenals/Urinary Tract: The bilateral native kidneys are markedly atrophic. Nonobstructing calculi are seen within the native kidneys, measuring up to 10 mm in size. There is a transplant kidney in the right lower quadrant, with preserved cortical thickness. Punctate calcifications at the transplant hilum may be vascular or related to nonobstructing calculi. No hydronephrosis. The bladder is minimally distended without focal abnormality. Adrenals are normal. Stomach/Bowel: No bowel obstruction or ileus. No bowel wall thickening or inflammatory change. Postsurgical changes are seen from duodenal resection. Evaluation at the surgical bed is limited without IV contrast. Vascular/Lymphatic: Aortic atherosclerosis. No enlarged abdominal or pelvic lymph nodes. Reproductive: Prostate is unremarkable. Other: Small volume ascites is seen within the bilateral flanks. No free intraperitoneal gas. No abdominal wall hernia. Musculoskeletal: There are no acute or destructive bony lesions. Chronic L3 compression deformity again noted. Mild diffuse body wall edema. Reconstructed images demonstrate no additional findings. IMPRESSION: 1. Moderate left pleural effusion, with compressive left lower lobe atelectasis. 2. Small volume ascites. 3. Chronic renal failure, with unremarkable transplant kidney right lower quadrant. Multiple nonobstructing renal calculi as above. 4. Postsurgical changes from Whipple procedure. 5.  Aortic Atherosclerosis (ICD10-I70.0). Coronary artery atherosclerosis. Electronically Signed   By: Randa Ngo M.D.   On: 06/26/2020 15:13   DG CHEST PORT 1 VIEW  Result Date: 07/02/2020 CLINICAL DATA:  Chest pain. EXAM: PORTABLE CHEST 1 VIEW COMPARISON:  06/11/2020. FINDINGS: Unchanged cardiomegaly. Unchanged mediastinal contours with aortic atherosclerosis. Retrocardiac opacity likely combination of pleural fluid and atelectasis. Improving interstitial opacities from prior exam. No pneumothorax. No acute osseous abnormalities are seen. IMPRESSION: 1. Retrocardiac opacity likely combination of pleural fluid and atelectasis. 2. Improving interstitial opacities from prior exam, suspect resolving pulmonary edema. 3. Unchanged cardiomegaly.  Electronically Signed   By: Keith Rake M.D.   On: 07/02/2020 22:18     Time coordinating discharge: Over 30 minutes    Paul Dee, MD  [facility] 07/20/2020, 2:45 PM

## 2020-07-20 NOTE — TOC Transition Note (Signed)
Transition of Care [facility]) - CM/SW Discharge Note *Discharged to [facility]   Patient Details  Name: Paul [name] MRN: [id number] Date of Birth: 06-May-1956  Transition of Care [facility]) CM/SW Contact:  Sable Feil, LCSW Phone Number: 07/20/2020, 4:46 PM   Clinical Narrative: Patient medically stable for discharge and going to [facility] for [place] rehab. The [employer] issue was resolved and patient has Medicare and BCBS supplement. Discharge clinicals transmitted to facility and talked with patient at the bedside regarding his discharge today. Patient has his wheelchair in the room and was informed that transport would not be able to take it. Patient was asked to have someone pick-up the wheelchair.    Final next level of care: [facility] - Room 104D) Barriers to Discharge: Barriers Resolved   Patient Goals and CMS Choice Patient states their goals for this hospitalization and ongoing recovery are:: Patient agreeable to rehab before returning home CMS Medicare.gov Compare Post Acute Care list provided to:: Patient (Mrs. Giammarco) Choice offered to / list presented to : Spouse,Patient  Discharge Placement   Existing PASRR number confirmed : 06/15/20          Patient chooses bed at:  [facility]) Patient to be transferred to facility by: Non-emergency ambulance transport Name of family member notified:  (Mr. Geiler informed his wife of today's discharge) Patient and family notified of of transfer: 07/20/20 (Patient was advised of today's discharge on 07/19/20)  Discharge Plan and Services In-house Referral: NA Discharge Planning Services: CM Consult Post Acute Care Choice: [place]          DME Arranged: N/A DME Agency: NA       HH Arranged: NA HH Agency: NA        Social Determinants of Health (SDOH) Interventions  None needed or requested at discharge.   Readmission Risk Interventions Readmission Risk  Prevention Plan 01/18/2020 04/26/2019 11/04/2018  Transportation Screening Complete Complete Complete  Medication Review Press photographer) Complete Complete Complete  PCP or Specialist appointment within 3-5 days of discharge Complete Not Complete Complete  PCP/Specialist Appt Not Complete comments - pending medical stability -  HRI or Home Care Consult Complete Complete Complete  SW Recovery Care/Counseling Consult Complete Complete Complete  Palliative Care Screening Not Applicable Not Complete Not Applicable  Comments - may be appropriate, will ask MD -  [place] Not Applicable Patient Refused Not Applicable  Some recent data might be hidden

## 2020-07-20 NOTE — Procedures (Signed)
Patient was seen on dialysis and the procedure was supervised.  BFR 400  Via AVF BP is  93/60.   Patient appears to be tolerating treatment well  [name] 07/20/2020

## 2020-07-20 NOTE — Progress Notes (Signed)
[facility] Progress Note   Subjective:     Seen on HD in recliner-  Lightheaded at present-  Given saline-  Reportedly going to SNF today ?    Objective Vitals:   07/20/20 0840 07/20/20 0900 07/20/20 0930 07/20/20 1000  BP: 110/76 (!) 100/59 93/63 93/60   Pulse: (!) 107 (!) 102 75 95  Resp:      Temp:      TempSrc:      SpO2:      Weight:      Height:       Physical Exam General: Chronically ill male; Appears comfortable; NAD; On 2L via [place] for comfort Heart: Irregular  no murmur or friction rub Lungs:CTA anteriorly, no wheezing, rales, or rhonchi Abdomen: Soft, round, non-tender, non-distended Extremities:B BKA; no edema bilateral hips and lower extremities Dialysis Access:R AVF (+) Bruit/Thrill   Filed Weights   07/17/20 0500 07/18/20 0731 07/18/20 1031  Weight: 64.9 kg 67.4 kg 66.2 kg    Intake/Output Summary (Last 24 hours) at 07/20/2020 1025 Last data filed at 07/20/2020 0801 Gross per 24 hour  Intake 1431 ml  Output 50 ml  Net 1381 ml    Additional Objective Labs: Basic Metabolic Panel: Recent Labs  Lab 07/18/20 0800 07/19/20 0655 07/20/20 0313 07/20/20 0854  NA 132* 134* 132* 133*  K 3.8 3.4* 3.6 3.8  CL 99 99 99 99  CO2 20* 23 20* 20*  GLUCOSE 232* 238* 187* 179*  BUN 104* 77* 98* 104*  CREATININE 5.05* 4.08* 4.76* 4.85*  CALCIUM 8.2* 8.2* 8.3* 8.3*  PHOS 5.5*  --  5.0* 5.2*   Liver Function Tests: Recent Labs  Lab 07/18/20 0800 07/20/20 0313 07/20/20 0854  ALBUMIN 1.7* 1.6* 1.6*   CBC: Recent Labs  Lab 07/15/20 0804 07/18/20 0800 07/19/20 0655 07/20/20 0854  WBC 7.7 9.3 8.9 9.2  NEUTROABS  --   --  6.2  --   HGB 8.7* 8.5* 8.7* 8.3*  HCT 29.1* 28.0* 28.7* 26.7*  MCV 93.3 94.0 92.6 92.4  PLT 360 346 310 331   Blood Culture    Component Value Date/Time   SDES BLOOD LEFT HAND 06/13/2020 0803   SDES BLOOD LEFT HAND 06/13/2020 0803   SPECREQUEST  06/13/2020 0803    BOTTLES DRAWN AEROBIC AND ANAEROBIC Blood  Culture adequate volume   SPECREQUEST  06/13/2020 0803    BOTTLES DRAWN AEROBIC AND ANAEROBIC Blood Culture adequate volume   CULT  06/13/2020 0803    NO GROWTH 5 DAYS Performed at [facility] [address] [place], [place] [address]    CULT  06/13/2020 0803    NO GROWTH 5 DAYS Performed at [facility] [address], [place], [place] [address]    REPTSTATUS 06/18/2020 FINAL 06/13/2020 0803   REPTSTATUS 06/18/2020 FINAL 06/13/2020 0803    CBG: Recent Labs  Lab 07/19/20 0655 07/19/20 1127 07/19/20 1620 07/19/20 2127 07/20/20 0646  GLUCAP 230* 211* 242* 234* 134*    Lab Results  Component Value Date   INR 1.2 01/04/2020   INR 1.2 10/28/2018   INR 1.25 06/14/2013   Medications: . sodium chloride    . sodium chloride    . sodium chloride     . (feeding supplement) PROSource Plus  30 mL Oral BID BM  . apixaban  5 mg Oral BID  . aspirin  81 mg Oral Daily  . atorvastatin  10 mg Oral Q M,W,F  . Chlorhexidine Gluconate Cloth  6  each Topical [id number]  . darbepoetin (ARANESP) injection - DIALYSIS  150 mcg Intravenous Q Tue-HD  . doxercalciferol  2 mcg Intravenous Q T,Th,Sa-HD  . feeding supplement (NEPRO CARB STEADY)  237 mL Oral TID WC  . hydrALAZINE  10 mg Oral Q8H  . insulin aspart  0-6 Units Subcutaneous TID WC  . insulin glargine  10 Units Subcutaneous Daily  . isosorbide mononitrate  15 mg Oral Daily  . lipase/protease/amylase  12,000 Units Oral TID AC  . magnesium chloride  2 tablet Oral BID  . metoprolol succinate  50 mg Oral Daily  . pantoprazole  80 mg Oral Daily  . predniSONE  5 mg Oral Q breakfast  . sertraline  50 mg Oral QHS  . sevelamer carbonate  800 mg Oral TID WC  . sodium chloride flush  3 mL Intravenous Q12H  . tacrolimus  1 mg Oral BID  . tamsulosin  0.4 mg Oral QPC supper    Dialysis Orders: TTS at [facility] 3:30hr, 400/800, EDW 66kg, 3K/2.25Ca, AVF, no heparin  - Hectoral 58mcg Iv q HD - Mircera 213mcg IV q 2 weeks (last given  1/13 - Hgb 10.9 at the time)  Assessment/Plan: 1. Sepsis 2/2 Enterococcus bacteremia(resolved): Completed antibiotics course. Echo without vegetations. 2. COVID-19/PNA(resolved): Tested (+) 06/11/20, off isolation. 3.ESRD:on  HD  TTS schedule. BUN pre-HD 104. running full time treatment. Remains on low dose IS for prior kidney transplant.Prograf recently decreased to 1mg  BID and eventually wean to off. 4. Anemiaof CKD: Hgb 8.7 - continueAranespq Tuesday,dose recently increased to 166mcg weekly. Will monitor trends. 5. Secondary hyperparathyroidism- Ca/Phos at goal- . Continue hectoroland renvela. 6.HTN/volume:Noted episodes of soft BP; per Hospitalist, imdur on hold. Continue metoprolol- no evidence of fluid volume overload at this time, relatively low goal.  Cards wanted toprol, imdur and hydralazine to be given on off HD days-  Not sure he will tolerate  7. Nutrition-Alb remains very low,1.7 continue protein supplements. 8. Tachy/brady syndrome- HxAFib/RVR. - On metoprolol, Eliquis. Not symptomatic. Monitor.  9. Dispo:Per Hospitalist note, anticipated discharge to [facility]- maybe today, will make plans for OP HD to continue on Saturday.  Corliss Parish, MD 07/20/2020

## 2020-07-20 NOTE — Progress Notes (Signed)
Progress Note  Patient Name: Paul [name] Date of Encounter: 07/20/2020  [facility] Cardiologist: Evalina Field, MD   Subjective   No acute overnight events. Saw patient during dialysis today. He states he feels like he breathing heavier right now. No chest pain. No palpitations with atrial fibrillation. He notes some occasional lightheadedness/dizziness.  Inpatient Medications    Scheduled Meds: . (feeding supplement) PROSource Plus  30 mL Oral BID BM  . apixaban  5 mg Oral BID  . aspirin  81 mg Oral Daily  . atorvastatin  10 mg Oral Q M,W,F  . Chlorhexidine Gluconate Cloth  6 each Topical Q0600  . darbepoetin (ARANESP) injection - DIALYSIS  150 mcg Intravenous Q Tue-HD  . doxercalciferol  2 mcg Intravenous Q T,Th,Sa-HD  . feeding supplement (NEPRO CARB STEADY)  237 mL Oral TID WC  . hydrALAZINE  10 mg Oral Q8H  . insulin aspart  0-6 Units Subcutaneous TID WC  . insulin glargine  10 Units Subcutaneous Daily  . isosorbide mononitrate  15 mg Oral Daily  . lipase/protease/amylase  12,000 Units Oral TID AC  . magnesium chloride  2 tablet Oral BID  . metoprolol succinate  50 mg Oral Daily  . pantoprazole  80 mg Oral Daily  . predniSONE  5 mg Oral Q breakfast  . sertraline  50 mg Oral QHS  . sevelamer carbonate  800 mg Oral TID WC  . sodium chloride flush  3 mL Intravenous Q12H  . tacrolimus  1 mg Oral BID  . tamsulosin  0.4 mg Oral QPC supper   Continuous Infusions: . sodium chloride    . sodium chloride    . sodium chloride     PRN Meds: sodium chloride, sodium chloride, sodium chloride, acetaminophen, alteplase, chlorpheniramine-HYDROcodone, cyclobenzaprine, guaiFENesin-dextromethorphan, heparin, Ipratropium-Albuterol, lidocaine (PF), lidocaine-prilocaine, pentafluoroprop-tetrafluoroeth, polyethylene glycol, sodium chloride flush, traMADol   Vital Signs    Vitals:   07/20/20 0543 07/20/20 0835 07/20/20 0840 07/20/20 0900  BP: 116/81 104/73 110/76 (!) 100/59   Pulse: 91 80 (!) 107 (!) 102  Resp: 16 16    Temp: (!) 97.5 F (36.4 C) 97.9 F (36.6 C)    TempSrc: Oral Oral    SpO2: 96% 98%    Weight:      Height:        Intake/Output Summary (Last 24 hours) at 07/20/2020 0931 Last data filed at 07/20/2020 0801 Gross per 24 hour  Intake 1431 ml  Output 50 ml  Net 1381 ml   Last 3 Weights 07/20/2020 07/18/2020 07/18/2020  Weight (lbs) (No Data) 145 lb 15.1 oz 148 lb 9.4 oz  Weight (kg) (No Data) 66.2 kg 67.4 kg      Telemetry    Atrial fibrillation with rates in the 90's to 120's. - Personally Reviewed  ECG    No new ECG tracing today. - Personally Reviewed  Physical Exam   GEN: No acute distress.   Neck: No JVD. Cardiac: Tachycardic with irregularly irregular rhythm. No murmurs, rubs, or gallops.  Respiratory: Clear to auscultation bilaterally. GI: Soft, non-distended, and non-tender. MS: No lower extremity edema. S/p bilateral BKA. Skin: Warm and dry. Neuro:  No focal deficits. Psych: Normal affect. Responds appropriately.  Labs    High Sensitivity Troponin:   Recent Labs  Lab 07/02/20 2201 07/03/20 0000  TROPONINIHS 30* 31*      Chemistry Recent Labs  Lab 07/15/20 0805 07/18/20 0800 07/19/20 0655 07/20/20 0313  NA 134* 132* 134* 132*  K  4.2 3.8 3.4* 3.6  CL 100 99 99 99  CO2 22 20* 23 20*  GLUCOSE 204* 232* 238* 187*  BUN 78* 104* 77* 98*  CREATININE 4.63* 5.05* 4.08* 4.76*  CALCIUM 8.3* 8.2* 8.2* 8.3*  ALBUMIN 1.7* 1.7*  --  1.6*  GFRNONAA 13* 12* 15* 13*  ANIONGAP 12 13 12 13      Hematology Recent Labs  Lab 07/15/20 0804 07/18/20 0800 07/19/20 0655  WBC 7.7 9.3 8.9  RBC 3.12* 2.98* 3.10*  HGB 8.7* 8.5* 8.7*  HCT 29.1* 28.0* 28.7*  MCV 93.3 94.0 92.6  MCH 27.9 28.5 28.1  MCHC 29.9* 30.4 30.3  RDW 17.2* 17.2* 17.2*  PLT 360 346 310    BNPNo results for input(s): BNP, PROBNP in the last 168 hours.   DDimer No results for input(s): DDIMER in the last 168 hours.   Radiology    No results  found.  Cardiac Studies   Echocardiogram 06/13/2020: Impressions: 1. Limited study to assess LV function; not all views obtained.  2. Left ventricular ejection fraction, by estimation, is 25 to 30%. The  left ventricle has severely decreased function. The left ventricle  demonstrates global hypokinesis. Left ventricular diastolic parameters are  consistent with Grade II diastolic  dysfunction (pseudonormalization).  3. Right ventricular systolic function is mildly reduced. The right  ventricular size is normal. There is normal pulmonary artery systolic  pressure.  4. Left atrial size was moderately dilated.  5. Right atrial size was mildly dilated.  6. Moderate pleural effusion in the left lateral region.  7. The mitral valve is normal in structure. Mild mitral valve  regurgitation. No evidence of mitral stenosis.  8. The aortic valve is tricuspid. Aortic valve regurgitation is mild.  Mild to moderate aortic valve sclerosis/calcification is present, without  any evidence of aortic stenosis.  9. The inferior vena cava is normal in size with greater than 50%  respiratory variability, suggesting right atrial pressure of 3 mmHg.   Patient Profile     65 y.o. male with a history of ESRD s/p failed renal transplant and back on hemodialysis as of 01/2020, persistent atrial fibrillation, PAD s/p bilateral BKA, TIA, hypertension, diabetes, and mild aortic stenosis. Patient was admitted on 06/11/2020 for COVID pneumonia. Cardiology was initially consulted for new onset systolic CHF and atrial flutter. He was treated medically and has had an extensive hospitalization. Cardiology re-consulted on 07/19/2020 for atrial fibrillation and CHF.  Assessment & Plan    New Onset Systolic CHF - Admitted with COVID-19 pneumonia. Course complicated by enteric coccus bacteremia. Cardiomyopathy could be due to sepsis or COVID or arrhythmias induced cardiomyopathy. Has now been back in atrial fibrillation  since 2/13 or 2/14. - Echo showed LVEF of 25-30% with global hypokinesis and grade 2 diastolic dysfunction.  - Volume status being managed by hemodialysis. - No ACE/ARB/ARNI due to ESRD.  - Continue Toprol-XL 50mg  daily. - Started on Hydralazine 10mg  three times daily and Imdur 15mg  as BP allows. However, BP soft with systolic BP in the 30'Z while in dialysis. Discussed with Dr. Audie Box, recommend continuing Hydralazine and Imdur on non-dialysis days. - Patient likely being discharged today. Can plan for outpatient DCCV. Anticipate repeat Echo after restoration of sinus rhythm to reassess LV function.  Persistent Atrial Fibrillation/Flutter - He has had paroxysmal atrial fibrillation and flutter this admission. Has been in persistent atrial fibrillation since 2/13 or 2/14. - Rates ranging from 90's to 120's this mornign during dialysis. - Continue Toprol-XL 50mg  daily.  BP soft so up-titration of this will be difficult. - Continue Eliquis 5mg  twice daily. Patient states he thinks he is going to be discharged after dialysis session today. Therefore, plan will be to see patient back in office in about 1 month. If still in atrial fibrillation at that time, can arrange for outpatient DCCV. Will likely need Amiodarone for rhythm control.  Otherwise, per primary team.  For questions or updates, please contact [place] Please consult [email or web address] for contact info under        Signed, Darreld Mclean, PA-C  07/20/2020, 9:31 AM

## 2020-07-20 NOTE — Progress Notes (Signed)
OT Cancellation Note  Patient Details Name: KASHTYN JANKOWSKI MRN: [id number] DOB: 1956-03-28   Cancelled Treatment:    Reason Eval/Treat Not Completed: Patient at procedure or test/ unavailable;Other (comment) pt out of room at HD, will check back as time allows for OT session.   Harley Alto., COTA/L Acute Rehabilitation Services [phone]   Precious Haws 07/20/2020, 8:36 AM

## 2020-08-11 ENCOUNTER — Inpatient Hospital Stay (HOSPITAL_COMMUNITY)
Admission: EM | Admit: 2020-08-11 | Discharge: 2020-09-17 | DRG: 871 | Disposition: E | Payer: Medicare Other | Source: Skilled Nursing Facility | Attending: Internal Medicine | Admitting: Internal Medicine

## 2020-08-11 ENCOUNTER — Emergency Department (HOSPITAL_COMMUNITY): Payer: Medicare Other

## 2020-08-11 ENCOUNTER — Encounter (HOSPITAL_COMMUNITY): Payer: Self-pay | Admitting: Emergency Medicine

## 2020-08-11 ENCOUNTER — Inpatient Hospital Stay (HOSPITAL_COMMUNITY): Payer: Medicare Other

## 2020-08-11 ENCOUNTER — Other Ambulatory Visit: Payer: Self-pay

## 2020-08-11 DIAGNOSIS — I959 Hypotension, unspecified: Secondary | ICD-10-CM | POA: Diagnosis present

## 2020-08-11 DIAGNOSIS — I5043 Acute on chronic combined systolic (congestive) and diastolic (congestive) heart failure: Secondary | ICD-10-CM | POA: Diagnosis not present

## 2020-08-11 DIAGNOSIS — E872 Acidosis, unspecified: Secondary | ICD-10-CM

## 2020-08-11 DIAGNOSIS — J90 Pleural effusion, not elsewhere classified: Secondary | ICD-10-CM | POA: Diagnosis present

## 2020-08-11 DIAGNOSIS — E8770 Fluid overload, unspecified: Secondary | ICD-10-CM | POA: Diagnosis present

## 2020-08-11 DIAGNOSIS — I132 Hypertensive heart and chronic kidney disease with heart failure and with stage 5 chronic kidney disease, or end stage renal disease: Secondary | ICD-10-CM | POA: Diagnosis present

## 2020-08-11 DIAGNOSIS — Z8042 Family history of malignant neoplasm of prostate: Secondary | ICD-10-CM

## 2020-08-11 DIAGNOSIS — N186 End stage renal disease: Secondary | ICD-10-CM | POA: Diagnosis present

## 2020-08-11 DIAGNOSIS — Z789 Other specified health status: Secondary | ICD-10-CM | POA: Diagnosis not present

## 2020-08-11 DIAGNOSIS — N2581 Secondary hyperparathyroidism of renal origin: Secondary | ICD-10-CM | POA: Diagnosis present

## 2020-08-11 DIAGNOSIS — R579 Shock, unspecified: Secondary | ICD-10-CM

## 2020-08-11 DIAGNOSIS — Z89512 Acquired absence of left leg below knee: Secondary | ICD-10-CM

## 2020-08-11 DIAGNOSIS — Z681 Body mass index (BMI) 19 or less, adult: Secondary | ICD-10-CM

## 2020-08-11 DIAGNOSIS — E1051 Type 1 diabetes mellitus with diabetic peripheral angiopathy without gangrene: Secondary | ICD-10-CM | POA: Diagnosis present

## 2020-08-11 DIAGNOSIS — Z823 Family history of stroke: Secondary | ICD-10-CM

## 2020-08-11 DIAGNOSIS — E1065 Type 1 diabetes mellitus with hyperglycemia: Secondary | ICD-10-CM | POA: Diagnosis not present

## 2020-08-11 DIAGNOSIS — J9601 Acute respiratory failure with hypoxia: Secondary | ICD-10-CM | POA: Diagnosis not present

## 2020-08-11 DIAGNOSIS — Z8673 Personal history of transient ischemic attack (TIA), and cerebral infarction without residual deficits: Secondary | ICD-10-CM

## 2020-08-11 DIAGNOSIS — Z992 Dependence on renal dialysis: Secondary | ICD-10-CM | POA: Diagnosis not present

## 2020-08-11 DIAGNOSIS — Z9115 Patient's noncompliance with renal dialysis: Secondary | ICD-10-CM

## 2020-08-11 DIAGNOSIS — Z90410 Acquired total absence of pancreas: Secondary | ICD-10-CM | POA: Diagnosis not present

## 2020-08-11 DIAGNOSIS — Z9689 Presence of other specified functional implants: Secondary | ICD-10-CM | POA: Diagnosis not present

## 2020-08-11 DIAGNOSIS — R578 Other shock: Secondary | ICD-10-CM | POA: Diagnosis present

## 2020-08-11 DIAGNOSIS — Z66 Do not resuscitate: Secondary | ICD-10-CM | POA: Diagnosis present

## 2020-08-11 DIAGNOSIS — E46 Unspecified protein-calorie malnutrition: Secondary | ICD-10-CM

## 2020-08-11 DIAGNOSIS — Z90411 Acquired partial absence of pancreas: Secondary | ICD-10-CM

## 2020-08-11 DIAGNOSIS — F32A Depression, unspecified: Secondary | ICD-10-CM | POA: Diagnosis not present

## 2020-08-11 DIAGNOSIS — Z09 Encounter for follow-up examination after completed treatment for conditions other than malignant neoplasm: Secondary | ICD-10-CM

## 2020-08-11 DIAGNOSIS — I9581 Postprocedural hypotension: Secondary | ICD-10-CM | POA: Diagnosis not present

## 2020-08-11 DIAGNOSIS — D849 Immunodeficiency, unspecified: Secondary | ICD-10-CM | POA: Diagnosis not present

## 2020-08-11 DIAGNOSIS — J939 Pneumothorax, unspecified: Secondary | ICD-10-CM

## 2020-08-11 DIAGNOSIS — Z7901 Long term (current) use of anticoagulants: Secondary | ICD-10-CM

## 2020-08-11 DIAGNOSIS — Z794 Long term (current) use of insulin: Secondary | ICD-10-CM

## 2020-08-11 DIAGNOSIS — D84821 Immunodeficiency due to drugs: Secondary | ICD-10-CM | POA: Diagnosis present

## 2020-08-11 DIAGNOSIS — R54 Age-related physical debility: Secondary | ICD-10-CM | POA: Diagnosis present

## 2020-08-11 DIAGNOSIS — I5020 Unspecified systolic (congestive) heart failure: Secondary | ICD-10-CM

## 2020-08-11 DIAGNOSIS — I4891 Unspecified atrial fibrillation: Secondary | ICD-10-CM | POA: Diagnosis not present

## 2020-08-11 DIAGNOSIS — Z9049 Acquired absence of other specified parts of digestive tract: Secondary | ICD-10-CM | POA: Diagnosis not present

## 2020-08-11 DIAGNOSIS — Z515 Encounter for palliative care: Secondary | ICD-10-CM | POA: Diagnosis not present

## 2020-08-11 DIAGNOSIS — E10649 Type 1 diabetes mellitus with hypoglycemia without coma: Secondary | ICD-10-CM | POA: Diagnosis not present

## 2020-08-11 DIAGNOSIS — Z89511 Acquired absence of right leg below knee: Secondary | ICD-10-CM

## 2020-08-11 DIAGNOSIS — N269 Renal sclerosis, unspecified: Secondary | ICD-10-CM | POA: Diagnosis present

## 2020-08-11 DIAGNOSIS — Z905 Acquired absence of kidney: Secondary | ICD-10-CM

## 2020-08-11 DIAGNOSIS — Y832 Surgical operation with anastomosis, bypass or graft as the cause of abnormal reaction of the patient, or of later complication, without mention of misadventure at the time of the procedure: Secondary | ICD-10-CM | POA: Diagnosis present

## 2020-08-11 DIAGNOSIS — Z888 Allergy status to other drugs, medicaments and biological substances status: Secondary | ICD-10-CM

## 2020-08-11 DIAGNOSIS — T8612 Kidney transplant failure: Secondary | ICD-10-CM | POA: Diagnosis present

## 2020-08-11 DIAGNOSIS — Y830 Surgical operation with transplant of whole organ as the cause of abnormal reaction of the patient, or of later complication, without mention of misadventure at the time of the procedure: Secondary | ICD-10-CM | POA: Diagnosis present

## 2020-08-11 DIAGNOSIS — Z79899 Other long term (current) drug therapy: Secondary | ICD-10-CM

## 2020-08-11 DIAGNOSIS — K219 Gastro-esophageal reflux disease without esophagitis: Secondary | ICD-10-CM | POA: Diagnosis present

## 2020-08-11 DIAGNOSIS — E1022 Type 1 diabetes mellitus with diabetic chronic kidney disease: Secondary | ICD-10-CM | POA: Diagnosis present

## 2020-08-11 DIAGNOSIS — Z7982 Long term (current) use of aspirin: Secondary | ICD-10-CM

## 2020-08-11 DIAGNOSIS — L8990 Pressure ulcer of unspecified site, unspecified stage: Secondary | ICD-10-CM | POA: Insufficient documentation

## 2020-08-11 DIAGNOSIS — Z8711 Personal history of peptic ulcer disease: Secondary | ICD-10-CM | POA: Diagnosis not present

## 2020-08-11 DIAGNOSIS — R000 Tachycardia, unspecified: Secondary | ICD-10-CM | POA: Diagnosis present

## 2020-08-11 DIAGNOSIS — I480 Paroxysmal atrial fibrillation: Secondary | ICD-10-CM | POA: Diagnosis present

## 2020-08-11 DIAGNOSIS — L89152 Pressure ulcer of sacral region, stage 2: Secondary | ICD-10-CM | POA: Diagnosis present

## 2020-08-11 DIAGNOSIS — Z85068 Personal history of other malignant neoplasm of small intestine: Secondary | ICD-10-CM

## 2020-08-11 DIAGNOSIS — Z7952 Long term (current) use of systemic steroids: Secondary | ICD-10-CM

## 2020-08-11 DIAGNOSIS — R627 Adult failure to thrive: Secondary | ICD-10-CM | POA: Diagnosis present

## 2020-08-11 DIAGNOSIS — K5900 Constipation, unspecified: Secondary | ICD-10-CM | POA: Diagnosis present

## 2020-08-11 DIAGNOSIS — I953 Hypotension of hemodialysis: Secondary | ICD-10-CM | POA: Diagnosis present

## 2020-08-11 DIAGNOSIS — E1159 Type 2 diabetes mellitus with other circulatory complications: Secondary | ICD-10-CM | POA: Diagnosis not present

## 2020-08-11 DIAGNOSIS — E1042 Type 1 diabetes mellitus with diabetic polyneuropathy: Secondary | ICD-10-CM | POA: Diagnosis present

## 2020-08-11 DIAGNOSIS — I9589 Other hypotension: Secondary | ICD-10-CM | POA: Diagnosis not present

## 2020-08-11 DIAGNOSIS — T82818A Embolism of vascular prosthetic devices, implants and grafts, initial encounter: Secondary | ICD-10-CM | POA: Diagnosis present

## 2020-08-11 DIAGNOSIS — R64 Cachexia: Secondary | ICD-10-CM | POA: Diagnosis present

## 2020-08-11 DIAGNOSIS — Z8249 Family history of ischemic heart disease and other diseases of the circulatory system: Secondary | ICD-10-CM

## 2020-08-11 DIAGNOSIS — R0602 Shortness of breath: Secondary | ICD-10-CM

## 2020-08-11 DIAGNOSIS — D631 Anemia in chronic kidney disease: Secondary | ICD-10-CM | POA: Diagnosis present

## 2020-08-11 DIAGNOSIS — E43 Unspecified severe protein-calorie malnutrition: Secondary | ICD-10-CM | POA: Diagnosis present

## 2020-08-11 DIAGNOSIS — E119 Type 2 diabetes mellitus without complications: Secondary | ICD-10-CM

## 2020-08-11 DIAGNOSIS — Z8616 Personal history of COVID-19: Secondary | ICD-10-CM | POA: Diagnosis not present

## 2020-08-11 DIAGNOSIS — Z7189 Other specified counseling: Secondary | ICD-10-CM | POA: Diagnosis not present

## 2020-08-11 LAB — CBC WITH DIFFERENTIAL/PLATELET
Abs Immature Granulocytes: 0.1 10*3/uL — ABNORMAL HIGH (ref 0.00–0.07)
Basophils Absolute: 0 10*3/uL (ref 0.0–0.1)
Basophils Relative: 0 %
Eosinophils Absolute: 0 10*3/uL (ref 0.0–0.5)
Eosinophils Relative: 0 %
HCT: 25.9 % — ABNORMAL LOW (ref 39.0–52.0)
Hemoglobin: 7.9 g/dL — ABNORMAL LOW (ref 13.0–17.0)
Immature Granulocytes: 2 %
Lymphocytes Relative: 5 %
Lymphs Abs: 0.4 10*3/uL — ABNORMAL LOW (ref 0.7–4.0)
MCH: 30 pg (ref 26.0–34.0)
MCHC: 30.5 g/dL (ref 30.0–36.0)
MCV: 98.5 fL (ref 80.0–100.0)
Monocytes Absolute: 0.5 10*3/uL (ref 0.1–1.0)
Monocytes Relative: 8 %
Neutro Abs: 5.5 10*3/uL (ref 1.7–7.7)
Neutrophils Relative %: 85 %
Platelets: 168 10*3/uL (ref 150–400)
RBC: 2.63 MIL/uL — ABNORMAL LOW (ref 4.22–5.81)
RDW: 16.6 % — ABNORMAL HIGH (ref 11.5–15.5)
WBC: 6.5 10*3/uL (ref 4.0–10.5)
nRBC: 0 % (ref 0.0–0.2)

## 2020-08-11 LAB — COMPREHENSIVE METABOLIC PANEL
ALT: 8 U/L (ref 0–44)
AST: 14 U/L — ABNORMAL LOW (ref 15–41)
Albumin: 1.7 g/dL — ABNORMAL LOW (ref 3.5–5.0)
Alkaline Phosphatase: 106 U/L (ref 38–126)
Anion gap: 14 (ref 5–15)
BUN: 76 mg/dL — ABNORMAL HIGH (ref 8–23)
CO2: 23 mmol/L (ref 22–32)
Calcium: 7.6 mg/dL — ABNORMAL LOW (ref 8.9–10.3)
Chloride: 102 mmol/L (ref 98–111)
Creatinine, Ser: 6.59 mg/dL — ABNORMAL HIGH (ref 0.61–1.24)
GFR, Estimated: 9 mL/min — ABNORMAL LOW (ref >60)
Glucose, Bld: 235 mg/dL — ABNORMAL HIGH (ref 70–99)
Potassium: 4.8 mmol/L (ref 3.5–5.1)
Sodium: 139 mmol/L (ref 135–145)
Total Bilirubin: 1.1 mg/dL (ref 0.3–1.2)
Total Protein: 4.8 g/dL — ABNORMAL LOW (ref 6.5–8.1)

## 2020-08-11 LAB — CBC
HCT: 25.9 % — ABNORMAL LOW (ref 39.0–52.0)
Hemoglobin: 7.4 g/dL — ABNORMAL LOW (ref 13.0–17.0)
MCH: 28.5 pg (ref 26.0–34.0)
MCHC: 28.6 g/dL — ABNORMAL LOW (ref 30.0–36.0)
MCV: 99.6 fL (ref 80.0–100.0)
Platelets: 174 10*3/uL (ref 150–400)
RBC: 2.6 MIL/uL — ABNORMAL LOW (ref 4.22–5.81)
RDW: 16.3 % — ABNORMAL HIGH (ref 11.5–15.5)
WBC: 6.9 10*3/uL (ref 4.0–10.5)
nRBC: 0 % (ref 0.0–0.2)

## 2020-08-11 LAB — BODY FLUID CELL COUNT WITH DIFFERENTIAL
Eos, Fluid: 0 %
Lymphs, Fluid: 67 %
Monocyte-Macrophage-Serous Fluid: 29 % — ABNORMAL LOW (ref 50–90)
Neutrophil Count, Fluid: 4 % (ref 0–25)
Total Nucleated Cell Count, Fluid: 542 cu mm (ref 0–1000)

## 2020-08-11 LAB — TYPE AND SCREEN
ABO/RH(D): A NEG
Antibody Screen: NEGATIVE

## 2020-08-11 LAB — I-STAT CHEM 8, ED
BUN: 76 mg/dL — ABNORMAL HIGH (ref 8–23)
Calcium, Ion: 0.91 mmol/L — ABNORMAL LOW (ref 1.15–1.40)
Chloride: 100 mmol/L (ref 98–111)
Creatinine, Ser: 6.5 mg/dL — ABNORMAL HIGH (ref 0.61–1.24)
Glucose, Bld: 227 mg/dL — ABNORMAL HIGH (ref 70–99)
HCT: 24 % — ABNORMAL LOW (ref 39.0–52.0)
Hemoglobin: 8.2 g/dL — ABNORMAL LOW (ref 13.0–17.0)
Potassium: 4.7 mmol/L (ref 3.5–5.1)
Sodium: 138 mmol/L (ref 135–145)
TCO2: 23 mmol/L (ref 22–32)

## 2020-08-11 LAB — GLUCOSE, CAPILLARY
Glucose-Capillary: 215 mg/dL — ABNORMAL HIGH (ref 70–99)
Glucose-Capillary: 178 mg/dL — ABNORMAL HIGH (ref 70–99)
Glucose-Capillary: 174 mg/dL — ABNORMAL HIGH (ref 70–99)
Glucose-Capillary: 98 mg/dL (ref 70–99)

## 2020-08-11 LAB — PROTEIN, PLEURAL OR PERITONEAL FLUID: Total protein, fluid: <3 g/dL

## 2020-08-11 LAB — CREATININE, SERUM
Creatinine, Ser: 6.61 mg/dL — ABNORMAL HIGH (ref 0.61–1.24)
GFR, Estimated: 9 mL/min — ABNORMAL LOW (ref >60)

## 2020-08-11 LAB — LACTIC ACID, PLASMA: Lactic Acid, Venous: 2.2 mmol/L (ref 0.5–1.9)

## 2020-08-11 LAB — LACTATE DEHYDROGENASE, PLEURAL OR PERITONEAL FLUID: LD, Fluid: 107 U/L — ABNORMAL HIGH (ref 3–23)

## 2020-08-11 LAB — MRSA PCR SCREENING: MRSA by PCR: NEGATIVE

## 2020-08-11 MED ORDER — LACTATED RINGERS IV BOLUS
500.0000 mL | Freq: Once | INTRAVENOUS | Status: AC
  Administered 2020-08-11: 500 mL via INTRAVENOUS

## 2020-08-11 MED ORDER — VANCOMYCIN HCL 1500 MG/300ML IV SOLN
1500.0000 mg | Freq: Once | INTRAVENOUS | Status: AC
  Administered 2020-08-11: 1500 mg via INTRAVENOUS
  Filled 2020-08-11: qty 300

## 2020-08-11 MED ORDER — HEPARIN SODIUM (PORCINE) 1000 UNIT/ML DIALYSIS
1000.0000 [IU] | INTRAMUSCULAR | Status: DC | PRN
Start: 2020-08-11 — End: 2020-08-21
  Administered 2020-08-11 – 2020-08-12 (×2): 2400 [IU] via INTRAVENOUS_CENTRAL
  Filled 2020-08-11 (×2): qty 3
  Filled 2020-08-11: qty 6
  Filled 2020-08-11: qty 5

## 2020-08-11 MED ORDER — PANCRELIPASE (LIP-PROT-AMYL) 12000-38000 UNITS PO CPEP
12000.0000 [IU] | ORAL_CAPSULE | Freq: Three times a day (TID) | ORAL | Status: DC
  Administered 2020-08-12 – 2020-08-16 (×15): 12000 [IU] via ORAL
  Filled 2020-08-11 (×17): qty 1

## 2020-08-11 MED ORDER — HEPARIN SODIUM (PORCINE) 5000 UNIT/ML IJ SOLN
5000.0000 [IU] | Freq: Three times a day (TID) | INTRAMUSCULAR | Status: DC
  Administered 2020-08-11 – 2020-08-12 (×2): 5000 [IU] via SUBCUTANEOUS
  Filled 2020-08-11 (×2): qty 1

## 2020-08-11 MED ORDER — LACTATED RINGERS IV BOLUS: 1000.0000 mL | Freq: Once | INTRAVENOUS | Status: DC

## 2020-08-11 MED ORDER — CALCIUM GLUCONATE-NACL 1-0.675 GM/50ML-% IV SOLN
1.0000 g | Freq: Once | INTRAVENOUS | Status: AC
  Administered 2020-08-11: 1000 mg via INTRAVENOUS
  Filled 2020-08-11: qty 50

## 2020-08-11 MED ORDER — INSULIN ASPART 100 UNIT/ML ~~LOC~~ SOLN
0.0000 [IU] | SUBCUTANEOUS | Status: DC
  Administered 2020-08-11 – 2020-08-12 (×3): 2 [IU] via SUBCUTANEOUS
  Administered 2020-08-12 (×3): 1 [IU] via SUBCUTANEOUS
  Administered 2020-08-12: 2 [IU] via SUBCUTANEOUS
  Administered 2020-08-13: 1 [IU] via SUBCUTANEOUS
  Administered 2020-08-13 (×2): 2 [IU] via SUBCUTANEOUS
  Administered 2020-08-13: 1 [IU] via SUBCUTANEOUS
  Administered 2020-08-13 – 2020-08-14 (×2): 2 [IU] via SUBCUTANEOUS
  Administered 2020-08-14: 1 [IU] via SUBCUTANEOUS
  Administered 2020-08-14 (×2): 3 [IU] via SUBCUTANEOUS
  Administered 2020-08-15 (×2): 2 [IU] via SUBCUTANEOUS
  Administered 2020-08-15: 1 [IU] via SUBCUTANEOUS
  Administered 2020-08-15: 2 [IU] via SUBCUTANEOUS
  Administered 2020-08-15: 3 [IU] via SUBCUTANEOUS
  Administered 2020-08-15: 1 [IU] via SUBCUTANEOUS
  Administered 2020-08-16: 5 [IU] via SUBCUTANEOUS
  Administered 2020-08-16: 1 [IU] via SUBCUTANEOUS
  Administered 2020-08-16: 2 [IU] via SUBCUTANEOUS
  Administered 2020-08-16: 1 [IU] via SUBCUTANEOUS
  Administered 2020-08-16: 3 [IU] via SUBCUTANEOUS
  Administered 2020-08-16: 2 [IU] via SUBCUTANEOUS
  Administered 2020-08-17: 1 [IU] via SUBCUTANEOUS
  Administered 2020-08-17: 2 [IU] via SUBCUTANEOUS

## 2020-08-11 MED ORDER — POLYETHYLENE GLYCOL 3350 17 G PO PACK: 17.0000 g | PACK | Freq: Every day | ORAL | Status: DC | PRN

## 2020-08-11 MED ORDER — PANTOPRAZOLE SODIUM 40 MG IV SOLR
40.0000 mg | Freq: Every day | INTRAVENOUS | Status: DC
  Administered 2020-08-11 – 2020-08-13 (×3): 40 mg via INTRAVENOUS
  Filled 2020-08-11 (×3): qty 40

## 2020-08-11 MED ORDER — NOREPINEPHRINE 4 MG/250ML-% IV SOLN
0.0000 ug/min | INTRAVENOUS | Status: DC
  Administered 2020-08-12: 2 ug/min via INTRAVENOUS
  Filled 2020-08-11 (×2): qty 250

## 2020-08-11 MED ORDER — DOCUSATE SODIUM 100 MG PO CAPS: 100.0000 mg | ORAL_CAPSULE | Freq: Two times a day (BID) | ORAL | Status: DC | PRN

## 2020-08-11 MED ORDER — SODIUM CHLORIDE 0.9 % IV SOLN
1.0000 g | INTRAVENOUS | Status: DC
  Administered 2020-08-11: 1 g via INTRAVENOUS
  Filled 2020-08-11 (×4): qty 1

## 2020-08-11 MED ORDER — FENTANYL CITRATE (PF) 100 MCG/2ML IJ SOLN
INTRAMUSCULAR | Status: AC
  Filled 2020-08-11: qty 2

## 2020-08-11 MED ORDER — VANCOMYCIN VARIABLE DOSE PER UNSTABLE RENAL FUNCTION (PHARMACIST DOSING): Status: DC

## 2020-08-11 MED ORDER — LACTATED RINGERS IV SOLN: INTRAVENOUS | Status: AC

## 2020-08-11 MED ORDER — FENTANYL CITRATE (PF) 100 MCG/2ML IJ SOLN
100.0000 ug | Freq: Once | INTRAMUSCULAR | Status: AC
  Administered 2020-08-11: 100 ug via INTRAVENOUS

## 2020-08-11 MED ORDER — LIDOCAINE HCL (PF) 1 % IJ SOLN
INTRAMUSCULAR | Status: AC
  Administered 2020-08-11: 5 mL
  Filled 2020-08-11: qty 5

## 2020-08-11 NOTE — ED Provider Notes (Signed)
[facility]ERG[facility]Y DEPARTMENT Provider Note   CSN: [id number] Arrival date & time: 08/17/2020  1318     History Chief Complaint  Patient presents with  . hypotensive  . Tachycardia    Paul [name] is a 65 y.o. male.  Patient comes from dialysis center with hypotension tachycardia fatigue and shortness of breath.  Symptoms are getting worse in the last few days.  Symptoms feel previous similar issues where he was admitted to the hospital for 2 months.  At that time he had Enterococcus bacteremia COVID and required 69-monthstay for rehabilitation and eventual placement.  Today his mental status is normal.  He denies chest pain.  He is short of breath.  He denies abdominal pain nausea vomiting.        Past Medical History:  Diagnosis Date  . A-fib (H[place]   . Anemia    esrd  . Calciphylaxis 05/2013   lower ext  . Cancer (H[place]ong[place]    hx duodenal adenoCa 2002, resected  . Closed left arm fracture 1967  . Corneal abrasion, left   . Depression   . Diabetes mellitus    Type 1 iddm x 11 yrs  . Diabetic neuropathy (HBuckhorn   . ESRD on hemodialysis (HPalo Pinto    Pt has ESRD due to DM.  He had a L upper arm AVF prior to starting HD which was ligated due to L arm steal syndrome.  He started HD in Jan 2014 and did home HD.  The family couldn't cannulate the R arm AVF successfully so by mid 2014 they decided to switch to PD which was done late summer 2014.  In Jan 2015 he was admitted with FTT felt to be due to underdialysis on PD and PD was abandoned and he   . GERD (gastroesophageal reflux disease)   . Glomerulosclerosis, diabetic (H[place]   . Heart murmur   . History of blood transfusion   . Hypertension    off of meds due to orthostatic hypotension  . Kidney transplant recipient   . Open wound of both legs with complication    Pt has had progressive wounds of both LE's including gangrene of the toes and patchy necrosis of the calves.  [facility] Pain[facility] hyperbaric O2 5d per week.  He is getting Na Thiosulfate with HD for suspected calciphylaxis. Pt says doctor's aren't sure if these ulcers were diabetic ulcers or calciphylaxis.  He underwent bilat BKA on 07/30/13.  He says that the woun    Patient Active Problem List   Diagnosis Date Noted  . PAF (paroxysmal atrial fibrillation) (HMorgan 06/11/2020  . Chronic combined systolic and diastolic CHF (congestive heart failure) (HLynd 06/11/2020  . Labile blood glucose   . Diabetic peripheral neuropathy (HRoss   . Anemia of chronic disease   . Right below-knee amputee (HDona Ana 01/18/2020  . Amputation stump infection (HM[place]   . Subacute osteomyelitis of right tibia (HReader   . Dehiscence of amputation stump (HAnahola   . Sepsis (HArroyo 01/04/2020  . Cellulitis 12/31/2019  . CKD stage 4 due to type 2 diabetes mellitus (HUnion[place] 04/29/2019  . AKI (acute kidney injury) (HNetarts 04/24/2019  . Hyperglycemia 04/24/2019  . Nausea and vomiting 04/24/2019  . Fever 04/23/2019  . Acute metabolic encephalopathy 062/83/1517 . Stroke (HLitchfield 10/28/2018  . Middle cerebral artery embolism, left 10/28/2018  . Metabolic acidosis 061/60/7371 . Dehydration 05/24/2018  . Stage II pressure ulcer (  [place]) 05/24/2018  . Dyspnea 05/23/2018  . Acute respiratory failure with hypoxia ([place]) 05/23/2018  . Abnormal cardiovascular stress test 12/29/2013  . Awaiting organ transplant 12/09/2013  . Leg ulcer ([place]) 11/17/2013  . Chronic kidney disease (CKD), stage V ([place]) 11/17/2013  . Diabetes ([place]) 11/17/2013  . Cancer of duodenum ([place]) 11/17/2013  . Dyslipidemia 11/17/2013  . Chronic kidney disease requiring chronic dialysis ([place]) 11/17/2013  . Hypercholesteremia 11/17/2013  . H/O malignant neoplasm 11/17/2013  . H/O: HTN (hypertension) 11/17/2013  . BP (high blood pressure) 11/17/2013  . Hypotension 11/17/2013  . Angiopathy, peripheral ([place]) 11/17/2013  . AF (paroxysmal atrial fibrillation) ([place]) 11/17/2013  . BKA stump  complication ([place]) 76/28/3151  . S/P bilateral BKA (below knee amputation) ([place]) 08/03/2013  . S/P BKA (below knee amputation) bilateral ([place]) 07/30/2013  . Open wound of both legs with complication 76/16/0737  . Acute blood loss anemia 07/12/2013  . Syncope 07/09/2013  . Intolerance to CAPD peritoneal dialysis s/p CAPD cath removal 07/09/2013 07/05/2013  . Critical lower limb ischemia ([place]) 06/30/2013  . Extremity pain 06/30/2013  . Atherosclerosis of native arteries of the extremities with ulceration(440.23) 06/17/2013  . Gait disorder 05/31/2013  . Weakness 05/24/2013  . Depression 04/03/2013  . GERD (gastroesophageal reflux disease) 04/03/2013  . Atrial fibrillation ([place]) 04/02/2013  . DM (diabetes mellitus) ([place]) 12/28/2012  . Other complications due to renal dialysis device, implant, and graft 05/19/2012  . Diabetes mellitus, type 2 ([place]) 11/13/2011  . Anemia 07/02/2011  . ESRD on hemodialysis ([place]) 05/29/2011    Past Surgical History:  Procedure Laterality Date  . AMPUTATION Bilateral 07/30/2013   Procedure: AMPUTATION BELOW KNEE;  Surgeon: Newt Minion, MD;  Location: [place];  Service: Orthopedics;  Laterality: Bilateral;  Bilateral Below Knee Amputations  . AMPUTATION Bilateral 09/03/2013   Procedure: AMPUTATION BELOW KNEE;  Surgeon: Newt Minion, MD;  Location: [place];  Service: Orthopedics;  Laterality: Bilateral;  Bilateral Below Knee Amputation Revision  . AV FISTULA PLACEMENT  06/07/2011   Procedure: ARTERIOVENOUS (AV) FISTULA CREATION;  Surgeon: Angelia Mould, MD;  Location: [place];  Service: Vascular;  Laterality: Left;  . AV FISTULA PLACEMENT  06/18/2012   Procedure: ARTERIOVENOUS (AV) FISTULA CREATION;  Surgeon: Mal Misty, MD;  Location: [place];  Service: Vascular;  Laterality: Right;  [place]  . BILIARY DIVERSION, EXTERNAL  2002  . BONE MARROW BIOPSY  2012  . CAPD INSERTION N/A 01/14/2013   Procedure: LAPAROSCOPIC INSERTION CONTINUOUS AMBULATORY PERITONEAL  DIALYSIS  (CAPD) CATHETER;  Surgeon: Adin Hector, MD;  Location: [place];  Service: General;  Laterality: N/A;  . CAPD REMOVAL N/A 07/09/2013   Procedure: CONTINUOUS AMBULATORY PERITONEAL DIALYSIS  (CAPD) CATHETER REMOVAL;  Surgeon: Adin Hector, MD;  Location: [place];  Service: General;  Laterality: N/A;  . COLONOSCOPY     Hx: of  . INSERTION OF DIALYSIS CATHETER  05/22/2012   Procedure: INSERTION OF DIALYSIS CATHETER;  Surgeon: Mal Misty, MD;  Location: [place];  Service: Vascular;  Laterality: N/A;  right internal jugular vein  . IR ANGIO INTRA EXTRACRAN SEL COM CAROTID INNOMINATE BILAT MOD SED  10/28/2018  . IR ANGIO VERTEBRAL SEL SUBCLAVIAN INNOMINATE UNI R MOD SED  10/28/2018  . IR ANGIO VERTEBRAL SEL VERTEBRAL UNI L MOD SED  10/28/2018  . LIGATION OF ARTERIOVENOUS  FISTULA  05/22/2012   Procedure: LIGATION OF ARTERIOVENOUS  FISTULA;  Surgeon: Mal Misty, MD;  Location: [place];  Service: Vascular;  Laterality: Left;  Left brachio-cephalic fistula  . LOWER EXTREMITY ANGIOGRAM N/A 06/14/2013   Procedure: LOWER EXTREMITY ANGIOGRAM;  Surgeon: Angelia Mould, MD;  [facility];  Service: Cardiovascular;  Laterality: N/A;  . NEPHRECTOMY TRANSPLANTED ORGAN    . OTHER SURGICAL HISTORY     Cyst removed from back  . PORTACATH PLACEMENT  2002  . RADIOLOGY WITH ANESTHESIA N/A 10/28/2018   Procedure: IR WITH ANESTHESIA;  Surgeon: Radiologist, Medication, MD;  Location: [place];  Service: Radiology;  Laterality: N/A;  . REMOVAL OF A DIALYSIS CATHETER    . RENAL BIOPSY, PERCUTANEOUS  2012  . STUMP REVISION Right 01/12/2020   Procedure: REVISION RIGHT BELOW KNEE AMPUTATION;  Surgeon: Newt Minion, MD;  Location: [place];  Service: Orthopedics;  Laterality: Right;  . TONSILLECTOMY    . VASCULAR SURGERY    . WHIPPLE PROCEDURE  2002   duodenal ca, Dr. Harlow Asa       Family History  Problem Relation Age of Onset  . Cancer Mother   . Cancer Father        prostate cancer  . Heart  disease Maternal Grandfather   . Stroke Paternal Grandmother     Social History   Tobacco Use  . Smoking status: Never Smoker  . Smokeless tobacco: Never Used  Vaping Use  . Vaping Use: Never used  Substance Use Topics  . Alcohol use: No  . Drug use: No    Home Medications Prior to Admission medications   Medication Sig Start Date End Date Taking? Authorizing Provider  acetaminophen (TYLENOL) 325 MG tablet Take 2 tablets (650 mg total) by mouth every 6 (six) hours as needed for mild pain (or Fever >/= 101). Patient not taking: No sig reported 01/18/20   Georgette Shell, MD  apixaban (ELIQUIS) 5 MG TABS tablet Take 1 tablet (5 mg total) by mouth 2 (two) times daily. 01/31/20   Angiulli, Lavon Paganini, PA-C  aspirin 81 MG chewable tablet Chew 81 mg by mouth daily.    [provider]  atorvastatin (LIPITOR) 10 MG tablet Take 1 tablet (10 mg total) by mouth every Monday, Wednesday, and Friday. 01/31/20   Angiulli, Lavon Paganini, PA-C  [employer] D3 50 MCG (2000 UT) CAPS Take 2 capsules (4,000 Units total) by mouth daily. 01/31/20   Angiulli, Lavon Paganini, PA-C  Darbepoetin Alfa (ARANESP) 60 MCG/0.3ML SOSY injection Inject 0.3 mLs (60 mcg total) into the vein every Thursday with hemodialysis. 02/03/20   Angiulli, Lavon Paganini, PA-C  hydrALAZINE (APRESOLINE) 10 MG tablet Take 0.5 tablets (5 mg total) by mouth 3 (three) times daily. 07/20/20   Dwyane Dee, MD  Insulin Glargine [facility]) 100 UNIT/ML Inject 8 Units into the skin daily. 01/31/20   Angiulli, Lavon Paganini, PA-C  insulin lispro (HUMALOG KWIKPEN) 100 UNIT/ML KwikPen Inject 2-3 Units into the skin 3 (three) times daily before meals.    [provider]  isosorbide mononitrate (IMDUR) 30 MG 24 hr tablet Take 0.5 tablets (15 mg total) by mouth daily. 07/20/20   Dwyane Dee, MD  lipase/protease/amylase (CREON) 12000-38000 units CPEP capsule Take 1 capsule (12,000 Units total) by mouth 3 (three) times daily before meals. 01/31/20   Angiulli,  Lavon Paganini, PA-C  magnesium chloride (SLOW-MAG) 64 MG TBEC SR tablet Take 2 tablets (128 mg total) by mouth 2 (two) times daily. Take at least 2 hours separate from tacrolimus. 01/31/20   Angiulli, Lavon Paganini, PA-C  metoprolol succinate (TOPROL-XL) 50 MG 24 hr tablet  Take 1 tablet (50 mg total) by mouth daily. Take with or immediately following a meal. 07/20/20   Dwyane Dee, MD  omeprazole (PRILOSEC) 40 MG capsule Take 1 capsule (40 mg total) by mouth at bedtime. 01/31/20   Angiulli, Lavon Paganini, PA-C  predniSONE (DELTASONE) 5 MG tablet Take 1 tablet (5 mg total) by mouth daily with breakfast. 01/31/20   Angiulli, Lavon Paganini, PA-C  sertraline (ZOLOFT) 50 MG tablet Take 1 tablet (50 mg total) by mouth at bedtime. Patient taking differently: Take 50 mg by mouth daily. 01/31/20   Angiulli, Lavon Paganini, PA-C  sevelamer carbonate (RENVELA) 800 MG tablet Take 1 tablet (800 mg total) by mouth 3 (three) times daily with meals. 07/20/20   Dwyane Dee, MD  sodium bicarbonate 650 MG tablet Take 1,950 mg by mouth 4 (four) times daily.    [provider]  tacrolimus (PROGRAF) 1 MG capsule Take 2 capsules (2 mg total) by mouth 2 (two) times daily. 01/31/20   Angiulli, Lavon Paganini, PA-C  tamsulosin (FLOMAX) 0.4 MG CAPS capsule Take 1 capsule (0.4 mg total) by mouth daily after supper. 01/31/20   Angiulli, Lavon Paganini, PA-C    Allergies    Metformin and related  Review of Systems   Review of Systems  Constitutional: Positive for fever. Negative for chills.  HENT: Negative for congestion and rhinorrhea.   Respiratory: Positive for shortness of breath. Negative for cough.   Cardiovascular: Negative for chest pain and palpitations.  Gastrointestinal: Negative for diarrhea, nausea and vomiting.  Genitourinary: Negative for difficulty urinating and dysuria.  Musculoskeletal: Negative for arthralgias and back pain.  Skin: Negative for color change and rash.  Neurological: Negative for light-headedness and headaches.     Physical Exam Updated Vital Signs BP (!) 81/62   Pulse 100   Temp (!) 97.5 F (36.4 C) (Oral)   Resp 19   Wt (S) 79.4 kg Comment: 66kg dry weight without prosthetics- prosthetics weigh 5.8kg  SpO2 100%   BMI 23.74 kg/m   Physical Exam Vitals and nursing note reviewed.  Constitutional:      General: He is not in acute distress.    Appearance: Normal appearance.  HENT:     Head: Normocephalic and atraumatic.     Nose: No rhinorrhea.  Eyes:     General:        Right eye: No discharge.        Left eye: No discharge.     Conjunctiva/sclera: Conjunctivae normal.  Cardiovascular:     Rate and Rhythm: Tachycardia present. Rhythm irregular.  Pulmonary:     Effort: Pulmonary effort is normal.     Breath sounds: No stridor.  Abdominal:     General: Abdomen is flat. There is no distension.     Palpations: Abdomen is soft.  Musculoskeletal:        General: No deformity or signs of injury.     Comments: Bilateral below the knee amputation no signs of skin changes.  Fistula in the right upper extremity with faint thrill.  Scattered skin tears and abrasions  Skin:    General: Skin is warm and dry.  Neurological:     General: No focal deficit present.     Mental Status: He is alert. Mental status is at baseline.     Motor: No weakness.  Psychiatric:        Mood and Affect: Mood normal.        Behavior: Behavior normal.  Thought Content: Thought content normal.     ED Results / Procedures / Treatments   Labs (all labs ordered are listed, but only abnormal results are displayed) Labs Reviewed  LACTIC ACID, PLASMA - Abnormal; Notable for the following components:      Result Value   Lactic Acid, Venous 2.2 (*)    All other components within normal limits  COMPREHENSIVE METABOLIC PANEL - Abnormal; Notable for the following components:   Glucose, Bld 235 (*)    BUN 76 (*)    Creatinine, Ser 6.59 (*)    Calcium 7.6 (*)    Total Protein 4.8 (*)    Albumin 1.7 (*)     AST 14 (*)    GFR, Estimated 9 (*)    All other components within normal limits  CBC WITH DIFFERENTIAL/PLATELET - Abnormal; Notable for the following components:   RBC 2.63 (*)    Hemoglobin 7.9 (*)    HCT 25.9 (*)    RDW 16.6 (*)    Lymphs Abs 0.4 (*)    Abs Immature Granulocytes 0.10 (*)    All other components within normal limits  I-STAT CHEM 8, ED - Abnormal; Notable for the following components:   BUN 76 (*)    Creatinine, Ser 6.50 (*)    Glucose, Bld 227 (*)    Calcium, Ion 0.91 (*)    Hemoglobin 8.2 (*)    HCT 24.0 (*)    All other components within normal limits  CULTURE, BLOOD (SINGLE)  URINE CULTURE  CULTURE, BLOOD (ROUTINE X 2)  CULTURE, BLOOD (ROUTINE X 2)  URINE CULTURE  CULTURE, RESPIRATORY W GRAM STAIN  LACTIC ACID, PLASMA  URINALYSIS, ROUTINE W REFLEX MICROSCOPIC  CBC  CREATININE, SERUM  TYPE AND SCREEN    EKG None  Radiology DG Chest Port 1 View  Result Date: 07/30/2020 CLINICAL DATA:  Suspect sepsis. Missed dialysis due to sickness. Hypotensive. EXAM: PORTABLE CHEST 1 VIEW COMPARISON:  07/02/2020 FINDINGS: Cardiac enlargement. Aortic atherosclerosis there is scratch set a large left pleural effusion is suspected with complete opacification of the left mid and left lower lung. Veil like opacification of the left upper lobe is also noted. Underlying airspace consolidation, atelectasis cannot be excluded. IMPRESSION: 1. Cardiac enlargement. 2. Suspect large left pleural effusion with complete opacification of the left mid and lower lung. Cannot exclude underlying atelectasis, airspace consolidation or mass. Consider repeat imaging following thoracentesis. Electronically Signed   By: Kerby Moors M.D.   On: 08/10/2020 14:09    Procedures .Critical Care E&M Performed by: Breck Coons, MD  Critical care provider statement:    Critical care time (minutes):  60   Critical care was necessary to treat or prevent imminent or life-threatening deterioration  of the following conditions:  Circulatory failure   Critical care was time spent personally by me on the following activities:  Blood draw for specimens, development of treatment plan with patient or surrogate, discussions with consultants, evaluation of patient's response to treatment, examination of patient, obtaining history from patient or surrogate, ordering and performing treatments and interventions, ordering and review of laboratory studies, re-evaluation of patient's condition and review of old charts   Care discussed with: admitting provider   After initial E/M assessment, critical care services were subsequently performed that were exclusive of separately billable procedures or treatment.       Medications Ordered in ED Medications  lactated ringers bolus 500 mL (has no administration in time range)  vancomycin (VANCOREADY) IVPB 1500 mg/300  mL (1,500 mg Intravenous New Bag/Given 08/03/2020 1359)  ceFEPIme (MAXIPIME) 1 g in sodium chloride 0.9 % 100 mL IVPB (0 g Intravenous Stopped 08/05/2020 1506)  norepinephrine (LEVOPHED) 42m in 2574mpremix infusion (has no administration in time range)  docusate sodium (COLACE) capsule 100 mg (has no administration in time range)  polyethylene glycol (MIRALAX / GLYCOLAX) packet 17 g (has no administration in time range)  heparin injection 5,000 Units (has no administration in time range)  vancomycin variable dose per unstable renal function (pharmacist dosing) (has no administration in time range)    ED Course  I have reviewed the triage vital signs and the nursing notes.  Pertinent labs & imaging results that were available during my care of the patient were reviewed by me and considered in my medical decision making (see chart for details).    MDM Rules/Calculators/A&P                          Unspecified hypotension.  History of ESRD missed dialysis secondary to access issues.  Lactic acidosis.  Antibiotics given.  Cultures obtained.   Critical care medicine consulted.  Norepinephrine started.  Gentle fluid hydration.  We will stop with 500 cc in addition to 300 given by EMS as this patient is in danger of volume overload.  Likely needs new access for hemodialysis.  Likely needs pigtail for drainage of pleural effusion.  Labs in imaging reviewed by myself.  Critical care at bedside.  CRITICAL CARE Performed by: ErBreck Coons Total critical care time: 60 minutes  Critical care time was exclusive of separately billable procedures and treating other patients.  Critical care was necessary to treat or prevent imminent or life-threatening deterioration.  Critical care was time spent personally by me on the following activities: development of treatment plan with patient and/or surrogate as well as nursing, discussions with consultants, evaluation of patient's response to treatment, examination of patient, obtaining history from patient or surrogate, ordering and performing treatments and interventions, ordering and review of laboratory studies, ordering and review of radiographic studies, pulse oximetry and re-evaluation of patient's condition.  Final Clinical Impression(s) / ED Diagnoses Final diagnoses:  Atrial fibrillation with RVR (HCC)  Lactic acidemia  SOB (shortness of breath)  Pleural effusion on left    Rx / DC Orders ED Discharge Orders    None       KaBreck CoonsMD 07/29/2020 1520

## 2020-08-11 NOTE — Procedures (Signed)
Central Venous Catheter Insertion Procedure Note  KARTHIKEYA FUNKE  [id number]  1956-04-11  Date:08/10/2020  Time:5:07 PM   Provider Performing:Timothy Iona Beard NP under direction of  Candee Furbish MD  Procedure: Insertion of Non-tunneled Central Venous Catheter(36556)with US guidance (58441)    Indication(s) Hemodialysis  Consent Risks of the procedure as well as the alternatives and risks of each were explained to the patient and/or caregiver.  Consent for the procedure was obtained and is signed in the bedside chart  Anesthesia Topical only with 1% lidocaine   Timeout Verified patient identification, verified procedure, site/side was marked, verified correct patient position, special equipment/implants available, medications/allergies/relevant history reviewed, required imaging and test results available.  Sterile Technique Maximal sterile technique including full sterile barrier drape, hand hygiene, sterile gown, sterile gloves, mask, hair covering, sterile ultrasound probe cover (if used).  Procedure Description Area of catheter insertion was cleaned with chlorhexidine and draped in sterile fashion.   With real-time ultrasound guidance a HD catheter was placed into the left internal jugular vein.  Nonpulsatile blood flow and easy flushing noted in all ports.  The catheter was sutured in place and sterile dressing applied.  Complications/Tolerance None; patient tolerated the procedure well. Chest X-ray is ordered to verify placement for internal jugular or subclavian cannulation.  Chest x-ray is not ordered for femoral cannulation.  EBL Minimal  Specimen(s) None

## 2020-08-11 NOTE — Consult Note (Signed)
[facility] Renal Consultation Note    Indication for Consultation:  Management of ESRD/hemodialysis; anemia, hypertension/volume and secondary hyperparathyroidism  HPI: Paul [name] is a 65 y.o. male with ESRD on HD, s/p failed DDKT), T1DM, HTN, B BKA d/t severe bilateral leg wounds c/w calciphylaxis (2015), Hx duodenal cancer s/p resection + Whipple, pAFib.  Recent [facility] admission 1/23-07/20/20 with sepsis secondary to Enterococcus bacteremia as well as Covid-19 infection. He completed 2 weeks of IV antibiotics after discharge.   Presented to ED from his dialysis center today with severe hypotension and dyspnea. SBPs 60s on arrival. CXR with large left pleural effusion with complete opacification of the left mid and lower lung. Labs: Na 138, K 4.7, CO2 23, BUN 76 Cr 6.59, Lactic acid 2.2. Hgb 7.9.  Has received 0.5L LR bolus. Empiric antibiotics started with plans for thoracentesis and PCCM to admit.   Usual dialysis at [facility] TTS. Dialysis via AVF. His last dialysis was Saturday 3/19. Fistula found to be clotted on Tuesday 3/22  and had declot/balloon angioplasty at [facility] on 3/24. Unfortunately, when he presented for dialysis today AVF appeared clotted again.      Seen and examined in ED. Tachycardic with AFib on monitor. BP 75/53.  He is alert, oriented. Endorses progressive fatigue, DOE, nausea, poor appetite. Denies f,c, cp, abd pain, edema.   Past Medical History:  Diagnosis Date  . A-fib ([place])   . Anemia    esrd  . Calciphylaxis 05/2013   lower ext  . Cancer ([place])    hx duodenal adenoCa 2002, resected  . Closed left arm fracture 1967  . Corneal abrasion, left   . Depression   . Diabetes mellitus    Type 1 iddm x 11 yrs  . Diabetic neuropathy ([place])   . ESRD on hemodialysis ([place])    Pt has ESRD due to DM.  He had a L upper arm AVF prior to starting HD which was ligated due to L arm steal syndrome.  He started HD in Jan 2014 and did home  HD.  The family couldn't cannulate the R arm AVF successfully so by mid 2014 they decided to switch to PD which was done late summer 2014.  In Jan 2015 he was admitted with FTT felt to be due to underdialysis on PD and PD was abandoned and he   . GERD (gastroesophageal reflux disease)   . Glomerulosclerosis, diabetic ([place])   . Heart murmur   . History of blood transfusion   . Hypertension    off of meds due to orthostatic hypotension  . Kidney transplant recipient   . Open wound of both legs with complication    Pt has had progressive wounds of both LE's including gangrene of the toes and patchy necrosis of the calves.  He has been treated at [facility] by Dr Jerline Pain with hyperbaric O2 5d per week.  He is getting Na Thiosulfate with HD for suspected calciphylaxis. Pt says doctor's aren't sure if these ulcers were diabetic ulcers or calciphylaxis.  He underwent bilat BKA on 07/30/13.  He says that the woun   Past Surgical History:  Procedure Laterality Date  . AMPUTATION Bilateral 07/30/2013   Procedure: AMPUTATION BELOW KNEE;  Surgeon: Newt Minion, MD;  Location: [place];  Service: Orthopedics;  Laterality: Bilateral;  Bilateral Below Knee Amputations  . AMPUTATION Bilateral 09/03/2013   Procedure: AMPUTATION BELOW KNEE;  Surgeon: Newt Minion, MD;  Location: [place];  Service: Orthopedics;  Laterality: Bilateral;  Bilateral Below Knee Amputation Revision  . AV FISTULA PLACEMENT  06/07/2011   Procedure: ARTERIOVENOUS (AV) FISTULA CREATION;  Surgeon: Angelia Mould, MD;  Location: [place];  Service: Vascular;  Laterality: Left;  . AV FISTULA PLACEMENT  06/18/2012   Procedure: ARTERIOVENOUS (AV) FISTULA CREATION;  Surgeon: Mal Misty, MD;  Location: [place];  Service: Vascular;  Laterality: Right;  [place]  . BILIARY DIVERSION, EXTERNAL  2002  . BONE MARROW BIOPSY  2012  . CAPD INSERTION N/A 01/14/2013   Procedure: LAPAROSCOPIC INSERTION CONTINUOUS AMBULATORY PERITONEAL DIALYSIS  (CAPD)  CATHETER;  Surgeon: Adin Hector, MD;  Location: [place];  Service: General;  Laterality: N/A;  . CAPD REMOVAL N/A 07/09/2013   Procedure: CONTINUOUS AMBULATORY PERITONEAL DIALYSIS  (CAPD) CATHETER REMOVAL;  Surgeon: Adin Hector, MD;  Location: [place];  Service: General;  Laterality: N/A;  . COLONOSCOPY     Hx: of  . INSERTION OF DIALYSIS CATHETER  05/22/2012   Procedure: INSERTION OF DIALYSIS CATHETER;  Surgeon: Mal Misty, MD;  Location: [place];  Service: Vascular;  Laterality: N/A;  right internal jugular vein  . IR ANGIO INTRA EXTRACRAN SEL COM CAROTID INNOMINATE BILAT MOD SED  10/28/2018  . IR ANGIO VERTEBRAL SEL SUBCLAVIAN INNOMINATE UNI R MOD SED  10/28/2018  . IR ANGIO VERTEBRAL SEL VERTEBRAL UNI L MOD SED  10/28/2018  . LIGATION OF ARTERIOVENOUS  FISTULA  05/22/2012   Procedure: LIGATION OF ARTERIOVENOUS  FISTULA;  Surgeon: Mal Misty, MD;  Location: [place];  Service: Vascular;  Laterality: Left;  Left brachio-cephalic fistula  . LOWER EXTREMITY ANGIOGRAM N/A 06/14/2013   Procedure: LOWER EXTREMITY ANGIOGRAM;  Surgeon: Angelia Mould, MD;  Location: [facility] CATH LAB;  Service: Cardiovascular;  Laterality: N/A;  . NEPHRECTOMY TRANSPLANTED ORGAN    . OTHER SURGICAL HISTORY     Cyst removed from back  . PORTACATH PLACEMENT  2002  . RADIOLOGY WITH ANESTHESIA N/A 10/28/2018   Procedure: IR WITH ANESTHESIA;  Surgeon: Radiologist, Medication, MD;  Location: [place];  Service: Radiology;  Laterality: N/A;  . REMOVAL OF A DIALYSIS CATHETER    . RENAL BIOPSY, PERCUTANEOUS  2012  . STUMP REVISION Right 01/12/2020   Procedure: REVISION RIGHT BELOW KNEE AMPUTATION;  Surgeon: Newt Minion, MD;  Location: [place];  Service: Orthopedics;  Laterality: Right;  . TONSILLECTOMY    . VASCULAR SURGERY    . WHIPPLE PROCEDURE  2002   duodenal ca, Dr. Harlow Asa   Family History  Problem Relation Age of Onset  . Cancer Mother   . Cancer Father        prostate cancer  . Heart disease Maternal Grandfather    . Stroke Paternal Grandmother    Social History:  reports that he has never smoked. He has never used smokeless tobacco. He reports that he does not drink alcohol and does not use drugs. Allergies  Allergen Reactions  . Metformin And Related Other (See Comments)    Has kidney problems   Prior to Admission medications   Medication Sig Start Date End Date Taking? Authorizing Provider  acetaminophen (TYLENOL) 325 MG tablet Take 2 tablets (650 mg total) by mouth every 6 (six) hours as needed for mild pain (or Fever >/= 101). Patient not taking: No sig reported 01/18/20   Georgette Shell, MD  apixaban (ELIQUIS) 5 MG TABS tablet Take 1 tablet (5 mg total) by mouth 2 (two) times daily. 01/31/20  AngiulliLavon Paganini, PA-C  aspirin 81 MG chewable tablet Chew 81 mg by mouth daily.    [provider]  atorvastatin (LIPITOR) 10 MG tablet Take 1 tablet (10 mg total) by mouth every Monday, Wednesday, and Friday. 01/31/20   Angiulli, Lavon Paganini, PA-C  CVS D3 50 MCG (2000 UT) CAPS Take 2 capsules (4,000 Units total) by mouth daily. 01/31/20   Angiulli, Lavon Paganini, PA-C  Darbepoetin Alfa (ARANESP) 60 MCG/0.3ML SOSY injection Inject 0.3 mLs (60 mcg total) into the vein every Thursday with hemodialysis. 02/03/20   Angiulli, Lavon Paganini, PA-C  hydrALAZINE (APRESOLINE) 10 MG tablet Take 0.5 tablets (5 mg total) by mouth 3 (three) times daily. 07/20/20   Dwyane Dee, MD  Insulin Glargine [facility]) 100 UNIT/ML Inject 8 Units into the skin daily. 01/31/20   Angiulli, Lavon Paganini, PA-C  insulin lispro (HUMALOG KWIKPEN) 100 UNIT/ML KwikPen Inject 2-3 Units into the skin 3 (three) times daily before meals.    [provider]  isosorbide mononitrate (IMDUR) 30 MG 24 hr tablet Take 0.5 tablets (15 mg total) by mouth daily. 07/20/20   Dwyane Dee, MD  lipase/protease/amylase (CREON) 12000-38000 units CPEP capsule Take 1 capsule (12,000 Units total) by mouth 3 (three) times daily before meals. 01/31/20    Angiulli, Lavon Paganini, PA-C  magnesium chloride (SLOW-MAG) 64 MG TBEC SR tablet Take 2 tablets (128 mg total) by mouth 2 (two) times daily. Take at least 2 hours separate from tacrolimus. 01/31/20   Angiulli, Lavon Paganini, PA-C  metoprolol succinate (TOPROL-XL) 50 MG 24 hr tablet Take 1 tablet (50 mg total) by mouth daily. Take with or immediately following a meal. 07/20/20   Dwyane Dee, MD  omeprazole (PRILOSEC) 40 MG capsule Take 1 capsule (40 mg total) by mouth at bedtime. 01/31/20   Angiulli, Lavon Paganini, PA-C  predniSONE (DELTASONE) 5 MG tablet Take 1 tablet (5 mg total) by mouth daily with breakfast. 01/31/20   Angiulli, Lavon Paganini, PA-C  sertraline (ZOLOFT) 50 MG tablet Take 1 tablet (50 mg total) by mouth at bedtime. Patient taking differently: Take 50 mg by mouth daily. 01/31/20   Angiulli, Lavon Paganini, PA-C  sevelamer carbonate (RENVELA) 800 MG tablet Take 1 tablet (800 mg total) by mouth 3 (three) times daily with meals. 07/20/20   Dwyane Dee, MD  sodium bicarbonate 650 MG tablet Take 1,950 mg by mouth 4 (four) times daily.    [provider]  tacrolimus (PROGRAF) 1 MG capsule Take 2 capsules (2 mg total) by mouth 2 (two) times daily. 01/31/20   Angiulli, Lavon Paganini, PA-C  tamsulosin (FLOMAX) 0.4 MG CAPS capsule Take 1 capsule (0.4 mg total) by mouth daily after supper. 01/31/20   Angiulli, Lavon Paganini, PA-C   Current Facility-Administered Medications  Medication Dose Route Frequency Provider Last Rate Last Admin  . ceFEPIme (MAXIPIME) 1 g in sodium chloride 0.9 % 100 mL IVPB  1 g Intravenous Q24H Breck Coons, MD   Stopped at 08/10/2020 1506  . docusate sodium (COLACE) capsule 100 mg  100 mg Oral BID PRN Icard, Bradley L, DO      . heparin injection 5,000 Units  5,000 Units Subcutaneous Q8H Icard, Bradley L, DO      . lactated ringers bolus 500 mL  500 mL Intravenous Once Breck Coons, MD      . norepinephrine (LEVOPHED) 4mg  in 217mL premix infusion  0-40 mcg/min Intravenous Continuous Breck Coons,  MD      . polyethylene glycol (  MIRALAX / GLYCOLAX) packet 17 g  17 g Oral Daily PRN Icard, Bradley L, DO      . vancomycin (VANCOREADY) IVPB 1500 mg/300 mL  1,500 mg Intravenous Once Sabino Donovan, MD 150 mL/hr at 08/01/2020 1359 1,500 mg at 07/18/2020 1359  . vancomycin variable dose per unstable renal function (pharmacist dosing)   Does not apply See admin instructions Icard, Ervan Heber Bo, DO       Current Outpatient Medications  Medication Sig Dispense Refill  . acetaminophen (TYLENOL) 325 MG tablet Take 2 tablets (650 mg total) by mouth every 6 (six) hours as needed for mild pain (or Fever >/= 101). (Patient not taking: No sig reported)    . apixaban (ELIQUIS) 5 MG TABS tablet Take 1 tablet (5 mg total) by mouth 2 (two) times daily. 60 tablet 4  . aspirin 81 MG chewable tablet Chew 81 mg by mouth daily.    [name] atorvastatin (LIPITOR) 10 MG tablet Take 1 tablet (10 mg total) by mouth every Monday, Wednesday, and Friday. 30 tablet 0  . CVS D3 50 MCG (2000 UT) CAPS Take 2 capsules (4,000 Units total) by mouth daily. 30 capsule 0  . Darbepoetin Alfa (ARANESP) 60 MCG/0.3ML SOSY injection Inject 0.3 mLs (60 mcg total) into the vein every Thursday with hemodialysis. 4.2 mL   . hydrALAZINE (APRESOLINE) 10 MG tablet Take 0.5 tablets (5 mg total) by mouth 3 (three) times daily.    . Insulin Glargine (BASAGLAR KWIKPEN) 100 UNIT/ML Inject 8 Units into the skin daily. 3 mL 0  . insulin lispro (HUMALOG KWIKPEN) 100 UNIT/ML KwikPen Inject 2-3 Units into the skin 3 (three) times daily before meals.    . isosorbide mononitrate (IMDUR) 30 MG 24 hr tablet Take 0.5 tablets (15 mg total) by mouth daily.    . lipase/protease/amylase (CREON) 12000-38000 units CPEP capsule Take 1 capsule (12,000 Units total) by mouth 3 (three) times daily before meals. 270 capsule 0  . magnesium chloride (SLOW-MAG) 64 MG TBEC SR tablet Take 2 tablets (128 mg total) by mouth 2 (two) times daily. Take at least 2 hours separate from tacrolimus. 60  tablet 0  . metoprolol succinate (TOPROL-XL) 50 MG 24 hr tablet Take 1 tablet (50 mg total) by mouth daily. Take with or immediately following a meal.    . omeprazole (PRILOSEC) 40 MG capsule Take 1 capsule (40 mg total) by mouth at bedtime. 30 capsule 0  . predniSONE (DELTASONE) 5 MG tablet Take 1 tablet (5 mg total) by mouth daily with breakfast. 30 tablet 0  . sertraline (ZOLOFT) 50 MG tablet Take 1 tablet (50 mg total) by mouth at bedtime. (Patient taking differently: Take 50 mg by mouth daily.) 30 tablet 0  . sevelamer carbonate (RENVELA) 800 MG tablet Take 1 tablet (800 mg total) by mouth 3 (three) times daily with meals.    . sodium bicarbonate 650 MG tablet Take 1,950 mg by mouth 4 (four) times daily.    . tacrolimus (PROGRAF) 1 MG capsule Take 2 capsules (2 mg total) by mouth 2 (two) times daily. 60 capsule 0  . tamsulosin (FLOMAX) 0.4 MG CAPS capsule Take 1 capsule (0.4 mg total) by mouth daily after supper. 30 capsule 0     ROS: As per HPI otherwise negative.  Physical Exam: Vitals:   07/24/2020 1440 07/26/2020 1445 07/30/2020 1455 07/21/2020 1500  BP: (!) 93/45 (!) 91/56 (!) 85/55 (!) 81/62  Pulse:  (!) 55 (!) 112 100  Resp: (!) 25 (!)  $'22 20 19  'S$ Temp:      TempSrc:      SpO2:  100% 100% 100%  Weight:         General: Chronically ill appearing, nad  Head: NCAT sclera not icteric  Neck: Supple. No JVD appreciated  Lungs: Decreased air movement; No rales, wheeze  Heart: Tachycardic. S1 S2 Abdomen: soft non-tender Lower extremities:  Bilateral BKAs no edema or wounds  Neuro: A & O  X 3. Moves all extremities spontaneously. Psych:  Responds to questions appropriately with a normal affect. Dialysis Access: RUE AVF -no bruit   Labs: Basic Metabolic Panel: Recent Labs  Lab 07/28/2020 1328 07/21/2020 1336  NA 139 138  K 4.8 4.7  CL 102 100  CO2 23  --   GLUCOSE 235* 227*  BUN 76* 76*  CREATININE 6.59* 6.50*  CALCIUM 7.6*  --    Liver Function Tests: Recent Labs  Lab  08/09/2020 1328  AST 14*  ALT 8  ALKPHOS 106  BILITOT 1.1  PROT 4.8*  ALBUMIN 1.7*   No results for input(s): LIPASE, AMYLASE in the last 168 hours. No results for input(s): AMMONIA in the last 168 hours. CBC: Recent Labs  Lab 08/16/2020 1328 07/20/2020 1336  WBC 6.5  --   NEUTROABS 5.5  --   HGB 7.9* 8.2*  HCT 25.9* 24.0*  MCV 98.5  --   PLT 168  --    Cardiac Enzymes: No results for input(s): CKTOTAL, CKMB, CKMBINDEX, TROPONINI in the last 168 hours. CBG: No results for input(s): GLUCAP in the last 168 hours. Iron Studies: No results for input(s): IRON, TIBC, TRANSFERRIN, FERRITIN in the last 72 hours. Studies/Results: DG Chest Port 1 View  Result Date: 07/26/2020 CLINICAL DATA:  Suspect sepsis. Missed dialysis due to sickness. Hypotensive. EXAM: PORTABLE CHEST 1 VIEW COMPARISON:  07/02/2020 FINDINGS: Cardiac enlargement. Aortic atherosclerosis there is scratch set a large left pleural effusion is suspected with complete opacification of the left mid and left lower lung. Veil like opacification of the left upper lobe is also noted. Underlying airspace consolidation, atelectasis cannot be excluded. IMPRESSION: 1. Cardiac enlargement. 2. Suspect large left pleural effusion with complete opacification of the left mid and lower lung. Cannot exclude underlying atelectasis, airspace consolidation or mass. Consider repeat imaging following thoracentesis. Electronically Signed   By: Kerby Moors M.D.   On: 08/04/2020 14:09    Dialysis Orders:  [facility] TTS 3.5h 400/500 EDW 66 kg 3K/2.5Ca  AVF No heparin  Calcitriol 1 tiw Venofer 100 x 5 (starting 3/19)  Mircera  100 q 2 wk (last ESA during prev admission)   Assessment/Plan: 1. ?Septic shock --recent  admission w Enterococcus bacteremia. Large left pleural effusion on CXR. For thoracentesis. Cultures pending.  Empiric antibiotics started.  --per PCCM.  2. AFib RVR --Usual meds Eliquis, metoprolol - Holding w sepsis w/u  3. ESRD -  HD TTS.  Missed HD x 2 this week d/t clotted access. S/p declot at [facility] 3/24 but appears clotted today likely 2/2 to hypotension. No urgent dialysis indications today. Will likely need temp cath.   4. Hypotension/volume  - Shock as above. Pressor support per PCCM .  5. Anemia  - On ESA as outpatient.  Restart ESA with next HD here. Hold IV Fe secondary to sepsis w/u   6. Metabolic bone disease -  Continue Auryixa binders/VDRA when taking PO  7. T1DM - Insulin per primary   Lynnda Child PA-C [facility] 08/15/2020, 3:23  PM

## 2020-08-11 NOTE — Progress Notes (Signed)
PCCM:  65 yo M, complex medical history, PMH duodenal cancer s/p whipple, prior renal transplant s/p failure DDKT, now ESRD on iHD, type 1 DM, PAD, S/p BKA, afib on eliquis. H/o covid19 PNA in JAN 2022. He missed dialysis Tuesday because they were unable to access his fistula. He went to get fistula looked at. They attempted to access today and was unsuccessful. Decision was made for evaluation in the ED.   In the ED, the patient was found to be hypotensive and started on levophed. He sees Dr. Farris Has from cardiology. New dx of systolic heart failure EF 25-30%. PAF   CXR in the ED has large left sided effusion which is worse in comparison to recent imaging. The patient's images have been independently reviewed by me.    BP (!) 75/53   Pulse (!) 127   Temp 97.7 F (36.5 C)   Resp (!) 23   Wt (S) 79.4 kg Comment: 66kg dry weight without prosthetics- prosthetics weigh 5.8kg  SpO2 100%   BMI 23.74 kg/m   Gen: Chronically ill, appearing male, resting in bed, cachetic HENT: tracking appropriately, NCAT, MM dry  Heart: tachy, regular, s1 s2 Lungs: absent on left base in comparison  Abd: soft, nt nd   Labs: reviewed  No acute need for HD at this time Sodium 138 BUN 76 K 4.7  A:  Hypotension, shock, multiorgan failure, possible sepsis, septic shock, present on admission Large Left Effusion, worsening from previous imaging, present on admission  Acute on Chronic Systolic heart failure  History of duodenal cancer s/p whipple  ESRD on iHD, TTS, failed DDKT  Loss of dialysis access, and fistula   P: Plans for pigtail drainage of left effusion  Place temp trialysis catheter for pressors and future HD need Send pleural fluid for routine fluid studies and cytology especially with cancer history  Levophed for MAP >41mmhg  Cefepime + vancomycin Follow up cultures.  Consult to nephrology  Consult to vascular surgery will be needed to correct his long term access needs Start assessment with  fistula doppler    This patient is critically ill with multiple organ system failure; which, requires frequent high complexity decision making, assessment, support, evaluation, and titration of therapies. This was completed through the application of advanced monitoring technologies and extensive interpretation of multiple databases. During this encounter critical care time was devoted to patient care services described in this note for 92 minutes.  Garner Nash, DO [facility] 07/30/2020 5:10 PM

## 2020-08-11 NOTE — Procedures (Signed)
Insertion of Chest Tube Procedure Note  Paul [name]  [id number]  [birth year]  Date:08/01/2020  Time:5:05 PM    Provider Performing: Hillery Aldo NP under direction of  Candee Furbish MD  Procedure: Pleural Catheter Insertion w/ Imaging Guidance [phone])  Indication(s) Effusion  Consent Risks of the procedure as well as the alternatives and risks of each were explained to the patient and/or caregiver.  Consent for the procedure was obtained and is signed in the bedside chart  Anesthesia Topical only with 1% lidocaine    Time Out Verified patient identification, verified procedure, site/side was marked, verified correct patient position, special equipment/implants available, medications/allergies/relevant history reviewed, required imaging and test results available.   Sterile Technique Maximal sterile technique including full sterile barrier drape, hand hygiene, sterile gown, sterile gloves, mask, hair covering, sterile ultrasound probe cover (if used).   Procedure Description Ultrasound used to identify appropriate pleural anatomy for placement and overlying skin marked. Area of placement cleaned and draped in sterile fashion.  A 14 French pigtail pleural catheter was placed into the left pleural space using Seldinger technique. Appropriate return of fluid was obtained.  The tube was connected to atrium and placed on -20 cm H2O wall suction.   Complications/Tolerance None; patient tolerated the procedure well. Chest X-ray is ordered to verify placement.   EBL Minimal  Specimen(s) fluid

## 2020-08-11 NOTE — ED Triage Notes (Signed)
To ED via [facility] from dialysis center- pt did not receive dialysis today due to being sick-- pt was hypotensive for EMS 66/38 -- 90/48 after some fluids -- 200cc  NS  Pt is pale, mucus membranes pale --  Has bilateral BKA with prosthetics.

## 2020-08-11 NOTE — ED Notes (Signed)
O2 placed per Dr. Ron Parker at 2L/M/[place]

## 2020-08-11 NOTE — Progress Notes (Signed)
Pharmacy Antibiotic Note  Paul [name] is a 65 y.o. male admitted on 08/09/2020 with sepsis.  Pharmacy has been consulted for vancomycin and cefepime dosing. Pt is ESRD on HD with recent hospitalization 05/2020 with enterococcal bacteremia discharged on 2 weeks of IV vancomycin.   Plan: Vancomycin 1500 mg IV x1, further vanc doses based on HD schedule. Goal trough 15-20 mcg/dL.  Cefepime 1g IV every 24 hours  Weight: (S) 79.4 kg (175 lb 0.7 oz) (66kg dry weight without prosthetics- prosthetics weigh 5.8kg)  Temp (24hrs), Avg:97.5 F (36.4 C), Min:97.5 F (36.4 C), Max:97.5 F (36.4 C)  Recent Labs  Lab 07/22/2020 1336  CREATININE 6.50*    Estimated Creatinine Clearance: 12.3 mL/min (A) (by C-G formula based on SCr of 6.5 mg/dL (H)).    Allergies  Allergen Reactions  . Metformin And Related Other (See Comments)    Has kidney problems    Antimicrobials this admission: Vancomycin 3/25> Cefepime 3/25>  Dose adjustments this admission: N/A  Microbiology results: 3/25 Bcx:  3/25 Ucx:   Thank you for allowing pharmacy to be a part of this patient's care.  Romilda Garret, PharmD PGY1 Acute Care Pharmacy Resident 07/25/2020 1:56 PM  Please check [email or web address] for unit specific pharmacy phone numbers.

## 2020-08-11 NOTE — ED Triage Notes (Signed)
Pt is from [facility]

## 2020-08-11 NOTE — ED Notes (Signed)
The pts wife is at  The bedside   Pt alert no pain

## 2020-08-11 NOTE — ED Notes (Signed)
Report given to rn on 2m 

## 2020-08-11 NOTE — H&P (Addendum)
NAME:  Paul [name], MRN:  [id number], DOB:  1955-10-30, LOS: 0 ADMISSION DATE:  08/14/2020, CONSULTATION DATE:  08/10/2020 REFERRING MD:  Ron Parker- EM, CHIEF COMPLAINT:  SOB  History of Present Illness:  65 yo M extensive PMH including ESRD s/p failed renal transplant - on THSa iHD, chronic immunosuppression, systolic heart failure, Afib on eliquis, duodenal cancer s/p whipple (2002), osteomyelitis and critical limb ischemia s/p Bilat BKAs, COVID, recent enterococcus bacteremia requiring hospitalization, who presented to ED 3/25 from [facility] home with SOB. SOB has increased over the past week -- associated with missing HD x3 due to inability to access the patient's RUE AVF. Pt hypotensive in ED with SBPs 70s-- received small IVF bolus and started on levo. CXR revealed large L pleural effusion. CMP obtained which did not reveal any absolute indications for emergent HD. CBC with WBC 6.5, hgb 7.9, plt 168. LA 2.2   Critical care consulted for admission  Pertinent  Medical History  Afib on eliquis Duodenal cancer s/p whipple ESRD Failed renal transplant Chronic immunosuppression  DM Gangrene Osteomyelitis Critical limb ischemia calciphylaxis S/p bilateral BKAs  HTN  COVID Enterococcus bacteremia  Combined systolic and diastolic heart failure, EF 25-30% Pleural effusion  IDDM  Significant Hospital Events: Including procedures, antibiotic start and stop dates in addition to other pertinent events   . 3/25 presented to ED from nursing home with SOB after multiple missed HD runs due to inability to access fistula. Hypotensive in ED, started on pressors. CXR with large L pleural effusion. Blood cx sent. Started on empiric abx for possible sepsis. Admitting to ICU, Planning for temp HD cath placement and likely pigtail placement   Interim History / Subjective:  Got 285m IVf which improved SBPs to 90. Starting pressors   Objective   Blood pressure (!) 75/53, pulse (!) 127,  temperature (!) 97.5 F (36.4 C), temperature source Oral, resp. rate (!) 23, weight (S) 79.4 kg, SpO2 100 %.        Intake/Output Summary (Last 24 hours) at 07/26/2020 1533 Last data filed at 07/25/2020 1506 Gross per 24 hour  Intake 450 ml  Output -  Net 450 ml   Filed Weights   07/26/2020 1332  Weight: (S) 79.4 kg    Examination: General: Chronically and acutely ill frail appearing M, reclined in bed talking, NAD HENT: Temporal muscle wasting. Tacky mm. White film over teeth and tongue Lungs: Diminished L sided sounds. R side CTA. Symmetrical chest expansion. Slight tachypnea. No accessory use Cardiovascular: tachycardic. No JVD. s1s2 Abdomen: soft thin nontender Extremities: bilateral BKAs. RUE AVF sites. Symmetrical muscle wasting. No acute joint deformity, no cyanosis or clubbing Neuro: AAOx4 following commands. L pupillary defect. No focal neuro deficit  GU: defer  Skin: clean, dry. Scattered ecchymosis.   Labs/imaging that I havepersonally reviewed  (right click and "Reselect all SmartList Selections" daily)  CMP -- glu 235 BUN 76 Cr 6.5 iCal 0.91  LA- 2.2 CBC- WBC 6.5 hgb 7.9 plt 168  CXR with large L pleural effusion   Resolved Hospital Problem list    Assessment & Plan:   L pleural effusion -looks like this started in 2020, recently worsened over P -is causing some sx, will do thora vs pigtail  -usual pleural labs and data  Shock -?septic vs hypovolemic vs cardiogenic (less favored), or mixed. -immunocomp host, recent bacteremia and looks like worsening effusion from prior admission -- infectious? Recent bacteremia. Hx osteo.  -clinically looks pretty dry, has had poor  intake and limited volume resusc due to ESRD + HFrEF + large effusion and SOB  P -continue NE for SBP >90 -gentle IVF  -admit to ICU -follow cx data  -continue broad abx  -cortisol level -echo   ESRD on TRSa HD S/p failed renal transplant -home prednisone 13m qAM, tacro 267mBID,   P -holding home pred and tacro -- re-eval in AM given concern for possible sepsis -needs temp tri-cath  -nephro consulted   Afib on eliquis HFrEF -last EF 25-30% P -repeat ECHO  -hold eliquis -- will defer systemic anticoag until after ICU procedures.   Lactic acidosis -trend   Hypocalcemia -replace    Anemia -chronic disease. Home aranesp  P -trend CBC goal hgb >7  Duodenal cancer s/p whipple -continue pancreatic enzymes   IDDM -SSI  Protein calorie malnutrition -RDN consult  Physical deconditioning -PT/OT  GOC -I think this patient would benefit from palliative care involvement to help establish goals of care. I worry about what looks to be a progressive decline over the past couple of years and certainly the past few months.   Best practice (right click and "Reselect all SmartList Selections" daily)  Diet:  NPO Pain/Anxiety/Delirium protocol (if indicated): No VAP protocol (if indicated): Not indicated DVT prophylaxis: Subcutaneous Heparin GI prophylaxis: PPI Glucose control:  SSI Yes Central venous access:  Yes, and it is still needed Arterial line:  N/A Foley:  N/A Mobility:  OOB  PT consulted: Yes Last date of multidisciplinary goals of care discussion: discussed with pt 3/25.  Code Status:  full code Disposition: Admit to ICU   Labs   CBC: Recent Labs  Lab 08/08/2020 1328 07/22/2020 1336  WBC 6.5  --   NEUTROABS 5.5  --   HGB 7.9* 8.2*  HCT 25.9* 24.0*  MCV 98.5  --   PLT 168  --     Basic Metabolic Panel: Recent Labs  Lab 07/25/2020 1328 08/17/2020 1336  NA 139 138  K 4.8 4.7  CL 102 100  CO2 23  --   GLUCOSE 235* 227*  BUN 76* 76*  CREATININE 6.59* 6.50*  CALCIUM 7.6*  --    GFR: Estimated Creatinine Clearance: 12.3 mL/min (A) (by C-G formula based on SCr of 6.5 mg/dL (H)). Recent Labs  Lab 08/03/2020 1328  WBC 6.5  LATICACIDVEN 2.2*    Liver Function Tests: Recent Labs  Lab 07/26/2020 1328  AST 14*  ALT 8  ALKPHOS 106   BILITOT 1.1  PROT 4.8*  ALBUMIN 1.7*   No results for input(s): LIPASE, AMYLASE in the last 168 hours. No results for input(s): AMMONIA in the last 168 hours.  ABG    Component Value Date/Time   PHART 7.419 06/11/2020 2338   PCO2ART 36.6 06/11/2020 2338   PO2ART 87.4 06/11/2020 2338   HCO3 23.2 06/11/2020 2338   TCO2 23 08/13/2020 1336   ACIDBASEDEF 0.7 06/11/2020 2338   O2SAT 93.9 06/11/2020 2338     Coagulation Profile: No results for input(s): INR, PROTIME in the last 168 hours.  Cardiac Enzymes: No results for input(s): CKTOTAL, CKMB, CKMBINDEX, TROPONINI in the last 168 hours.  HbA1C: Hgb A1c MFr Bld  Date/Time Value Ref Range Status  06/12/2020 09:07 AM 7.5 (H) 4.8 - 5.6 % Final    Comment:    (NOTE) Pre diabetes:          5.7%-6.4%  Diabetes:              >6.4%  Glycemic control  for   <7.0% adults with diabetes   12/31/2019 02:19 PM 10.3 (H) 4.8 - 5.6 % Final    Comment:    (NOTE) Pre diabetes:          5.7%-6.4%  Diabetes:              >6.4%  Glycemic control for   <7.0% adults with diabetes     CBG: No results for input(s): GLUCAP in the last 168 hours.  Review of Systems:   +SOB +poor PO intake + lethargy + weakness  Past Medical History:  He,  has a past medical history of A-fib ([place]), Anemia, Calciphylaxis (05/2013), Cancer ([place]), Closed left arm fracture (1967), Corneal abrasion, left, Depression, Diabetes mellitus, Diabetic neuropathy ([place]), ESRD on hemodialysis ([place]), GERD (gastroesophageal reflux disease), Glomerulosclerosis, diabetic ([place]), Heart murmur, History of blood transfusion, Hypertension, Kidney transplant recipient, and Open wound of both legs with complication.   Surgical History:   Past Surgical History:  Procedure Laterality Date  . AMPUTATION Bilateral 07/30/2013   Procedure: AMPUTATION BELOW KNEE;  Surgeon: Newt Minion, MD;  Location: [facility];  Service: Orthopedics;  Laterality: Bilateral;  Bilateral Below Knee  Amputations  . AMPUTATION Bilateral 09/03/2013   Procedure: AMPUTATION BELOW KNEE;  Surgeon: Newt Minion, MD;  Location: [facility];  Service: Orthopedics;  Laterality: Bilateral;  Bilateral Below Knee Amputation Revision  . AV FISTULA PLACEMENT  06/07/2011   Procedure: ARTERIOVENOUS (AV) FISTULA CREATION;  Surgeon: Angelia Mould, MD;  Location: [facility];  Service: Vascular;  Laterality: Left;  . AV FISTULA PLACEMENT  06/18/2012   Procedure: ARTERIOVENOUS (AV) FISTULA CREATION;  Surgeon: Mal Misty, MD;  Location: [facility];  Service: Vascular;  Laterality: Right;  [place]  . BILIARY DIVERSION, EXTERNAL  2002  . BONE MARROW BIOPSY  2012  . CAPD INSERTION N/A 01/14/2013   Procedure: LAPAROSCOPIC INSERTION CONTINUOUS AMBULATORY PERITONEAL DIALYSIS  (CAPD) CATHETER;  Surgeon: Adin Hector, MD;  Location: [facility];  Service: General;  Laterality: N/A;  . CAPD REMOVAL N/A 07/09/2013   Procedure: CONTINUOUS AMBULATORY PERITONEAL DIALYSIS  (CAPD) CATHETER REMOVAL;  Surgeon: Adin Hector, MD;  Location: [facility];  Service: General;  Laterality: N/A;  . COLONOSCOPY     Hx: of  . INSERTION OF DIALYSIS CATHETER  05/22/2012   Procedure: INSERTION OF DIALYSIS CATHETER;  Surgeon: Mal Misty, MD;  Location: [facility];  Service: Vascular;  Laterality: N/A;  right internal jugular vein  . IR ANGIO INTRA EXTRACRAN SEL COM CAROTID INNOMINATE BILAT MOD SED  10/28/2018  . IR ANGIO VERTEBRAL SEL SUBCLAVIAN INNOMINATE UNI R MOD SED  10/28/2018  . IR ANGIO VERTEBRAL SEL VERTEBRAL UNI L MOD SED  10/28/2018  . LIGATION OF ARTERIOVENOUS  FISTULA  05/22/2012   Procedure: LIGATION OF ARTERIOVENOUS  FISTULA;  Surgeon: Mal Misty, MD;  Location: [facility];  Service: Vascular;  Laterality: Left;  Left brachio-cephalic fistula  . LOWER EXTREMITY ANGIOGRAM N/A 06/14/2013   Procedure: LOWER EXTREMITY ANGIOGRAM;  Surgeon: Angelia Mould, MD;  Location: [facility] C[facility] LAB;  Service: Cardiovascular;  Laterality: N/A;  . NEPHRECTOMY  TRANSPLANTED ORGAN    . OTHER SURGICAL HISTORY     Cyst removed from back  . PORTACATH PLACEMENT  2002  . RADIOLOGY WITH ANESTHESIA N/A 10/28/2018   Procedure: IR WITH ANESTHESIA;  Surgeon: Radiologist, Medication, MD;  Location: [facility];  Service: Radiology;  Laterality: N/A;  . REMOVAL OF A DIALYSIS CATHETER    . RENAL  BIOPSY, PERCUTANEOUS  2012  . STUMP REVISION Right 01/12/2020   Procedure: REVISION RIGHT BELOW KNEE AMPUTATION;  Surgeon: Newt Minion, MD;  Location: [facility];  Service: Orthopedics;  Laterality: Right;  . TONSILLECTOMY    . VASCULAR SURGERY    . WHIPPLE PROCEDURE  2002   duodenal ca, Dr. Harlow Asa     Social History:   reports that he has never smoked. He has never used smokeless tobacco. He reports that he does not drink alcohol and does not use drugs.   Family History:  His family history includes Cancer in his father and mother; Heart disease in his maternal grandfather; Stroke in his paternal grandmother.   Allergies Allergies  Allergen Reactions  . Metformin And Related Other (See Comments)    Has kidney problems     Home Medications  Prior to Admission medications   Medication Sig Start Date End Date Taking? Authorizing Provider  acetaminophen (TYLENOL) 325 MG tablet Take 2 tablets (650 mg total) by mouth every 6 (six) hours as needed for mild pain (or Fever >/= 101). Patient not taking: No sig reported 01/18/20   Georgette Shell, MD  apixaban (ELIQUIS) 5 MG TABS tablet Take 1 tablet (5 mg total) by mouth 2 (two) times daily. 01/31/20   Angiulli, Lavon Paganini, PA-C  aspirin 81 MG chewable tablet Chew 81 mg by mouth daily.    [provider]  atorvastatin (LIPITOR) 10 MG tablet Take 1 tablet (10 mg total) by mouth every Monday, Wednesday, and Friday. 01/31/20   Angiulli, Lavon Paganini, PA-C  [employer] D3 50 MCG (2000 UT) CAPS Take 2 capsules (4,000 Units total) by mouth daily. 01/31/20   Angiulli, Lavon Paganini, PA-C  Darbepoetin Alfa (ARANESP) 60 MCG/0.3ML SOSY injection  Inject 0.3 mLs (60 mcg total) into the vein every Thursday with hemodialysis. 02/03/20   Angiulli, Lavon Paganini, PA-C  hydrALAZINE (APRESOLINE) 10 MG tablet Take 0.5 tablets (5 mg total) by mouth 3 (three) times daily. 07/20/20   Dwyane Dee, MD  Insulin Glargine [facility] 100 UNIT/ML Inject 8 Units into the skin daily. 01/31/20   Angiulli, Lavon Paganini, PA-C  insulin lispro (HUMALOG KWIKPEN) 100 UNIT/ML KwikPen Inject 2-3 Units into the skin 3 (three) times daily before meals.    [provider]  isosorbide mononitrate (IMDUR) 30 MG 24 hr tablet Take 0.5 tablets (15 mg total) by mouth daily. 07/20/20   Dwyane Dee, MD  lipase/protease/amylase (CREON) 12000-38000 units CPEP capsule Take 1 capsule (12,000 Units total) by mouth 3 (three) times daily before meals. 01/31/20   Angiulli, Lavon Paganini, PA-C  magnesium chloride (SLOW-MAG) 64 MG TBEC SR tablet Take 2 tablets (128 mg total) by mouth 2 (two) times daily. Take at least 2 hours separate from tacrolimus. 01/31/20   Angiulli, Lavon Paganini, PA-C  metoprolol succinate (TOPROL-XL) 50 MG 24 hr tablet Take 1 tablet (50 mg total) by mouth daily. Take with or immediately following a meal. 07/20/20   Dwyane Dee, MD  omeprazole (PRILOSEC) 40 MG capsule Take 1 capsule (40 mg total) by mouth at bedtime. 01/31/20   Angiulli, Lavon Paganini, PA-C  predniSONE (DELTASONE) 5 MG tablet Take 1 tablet (5 mg total) by mouth daily with breakfast. 01/31/20   Angiulli, Lavon Paganini, PA-C  sertraline (ZOLOFT) 50 MG tablet Take 1 tablet (50 mg total) by mouth at bedtime. Patient taking differently: Take 50 mg by mouth daily. 01/31/20   Angiulli, Lavon Paganini, PA-C  sevelamer carbonate (RENVELA) 800 MG tablet Take 1  tablet (800 mg total) by mouth 3 (three) times daily with meals. 07/20/20   Dwyane Dee, MD  sodium bicarbonate 650 MG tablet Take 1,950 mg by mouth 4 (four) times daily.    [provider]  tacrolimus (PROGRAF) 1 MG capsule Take 2 capsules (2 mg total) by mouth 2  (two) times daily. 01/31/20   Angiulli, Lavon Paganini, PA-C  tamsulosin (FLOMAX) 0.4 MG CAPS capsule Take 1 capsule (0.4 mg total) by mouth daily after supper. 01/31/20   Angiulli, Lavon Paganini, PA-C     Critical care time: 63 min      CRITICAL CARE Performed by: Cristal Generous   Total critical care time: 63 minutes  Critical care time was exclusive of separately billable procedures and treating other patients.  Critical care was necessary to treat or prevent imminent or life-threatening deterioration.  Critical care was time spent personally by me on the following activities: development of treatment plan with patient and/or surrogate as well as nursing, discussions with consultants, evaluation of patient's response to treatment, examination of patient, obtaining history from patient or surrogate, ordering and performing treatments and interventions, ordering and review of laboratory studies, ordering and review of radiographic studies, pulse oximetry and re-evaluation of patient's condition.  Eliseo Gum MSN, AGACNP-BC [place] [phone] If no answer, [phone] 07/26/2020, 4:28 PM    PCCM:  Please see progress note detailed from today for medical decision making separate critical care care time billed.     Garner Nash, DO [facility] 07/30/2020 5:16 PM

## 2020-08-12 ENCOUNTER — Inpatient Hospital Stay (HOSPITAL_COMMUNITY): Payer: Medicare Other

## 2020-08-12 DIAGNOSIS — Z79899 Other long term (current) drug therapy: Secondary | ICD-10-CM

## 2020-08-12 DIAGNOSIS — D84821 Immunodeficiency due to drugs: Secondary | ICD-10-CM

## 2020-08-12 DIAGNOSIS — I9589 Other hypotension: Secondary | ICD-10-CM | POA: Diagnosis not present

## 2020-08-12 DIAGNOSIS — Z7189 Other specified counseling: Secondary | ICD-10-CM

## 2020-08-12 DIAGNOSIS — J90 Pleural effusion, not elsewhere classified: Secondary | ICD-10-CM | POA: Diagnosis not present

## 2020-08-12 DIAGNOSIS — I5020 Unspecified systolic (congestive) heart failure: Secondary | ICD-10-CM | POA: Diagnosis not present

## 2020-08-12 DIAGNOSIS — I9581 Postprocedural hypotension: Secondary | ICD-10-CM | POA: Diagnosis not present

## 2020-08-12 DIAGNOSIS — I4891 Unspecified atrial fibrillation: Secondary | ICD-10-CM | POA: Diagnosis not present

## 2020-08-12 DIAGNOSIS — Z789 Other specified health status: Secondary | ICD-10-CM

## 2020-08-12 DIAGNOSIS — R579 Shock, unspecified: Secondary | ICD-10-CM | POA: Diagnosis not present

## 2020-08-12 LAB — GLUCOSE, CAPILLARY
Glucose-Capillary: 119 mg/dL — ABNORMAL HIGH (ref 70–99)
Glucose-Capillary: 129 mg/dL — ABNORMAL HIGH (ref 70–99)
Glucose-Capillary: 145 mg/dL — ABNORMAL HIGH (ref 70–99)
Glucose-Capillary: 173 mg/dL — ABNORMAL HIGH (ref 70–99)
Glucose-Capillary: 148 mg/dL — ABNORMAL HIGH (ref 70–99)
Glucose-Capillary: 154 mg/dL — ABNORMAL HIGH (ref 70–99)

## 2020-08-12 LAB — BASIC METABOLIC PANEL
Anion gap: 10 (ref 5–15)
BUN: 79 mg/dL — ABNORMAL HIGH (ref 8–23)
CO2: 29 mmol/L (ref 22–32)
Calcium: 8 mg/dL — ABNORMAL LOW (ref 8.9–10.3)
Chloride: 100 mmol/L (ref 98–111)
Creatinine, Ser: 6.63 mg/dL — ABNORMAL HIGH (ref 0.61–1.24)
GFR, Estimated: 9 mL/min — ABNORMAL LOW (ref >60)
Glucose, Bld: 129 mg/dL — ABNORMAL HIGH (ref 70–99)
Potassium: 5 mmol/L (ref 3.5–5.1)
Sodium: 139 mmol/L (ref 135–145)

## 2020-08-12 LAB — CBC
HCT: 25.8 % — ABNORMAL LOW (ref 39.0–52.0)
Hemoglobin: 7.8 g/dL — ABNORMAL LOW (ref 13.0–17.0)
MCH: 29.8 pg (ref 26.0–34.0)
MCHC: 30.2 g/dL (ref 30.0–36.0)
MCV: 98.5 fL (ref 80.0–100.0)
Platelets: 184 10*3/uL (ref 150–400)
RBC: 2.62 MIL/uL — ABNORMAL LOW (ref 4.22–5.81)
RDW: 16.5 % — ABNORMAL HIGH (ref 11.5–15.5)
WBC: 7.7 10*3/uL (ref 4.0–10.5)
nRBC: 0 % (ref 0.0–0.2)

## 2020-08-12 LAB — CORTISOL: Cortisol, Plasma: 13 ug/dL

## 2020-08-12 LAB — MAGNESIUM: Magnesium: 2.1 mg/dL (ref 1.7–2.4)

## 2020-08-12 LAB — ECHOCARDIOGRAM LIMITED
Height: 72 in
S' Lateral: 3.3 cm
Weight: 2292.78 oz

## 2020-08-12 LAB — LACTIC ACID, PLASMA: Lactic Acid, Venous: 1 mmol/L (ref 0.5–1.9)

## 2020-08-12 LAB — PHOSPHORUS: Phosphorus: 7 mg/dL — ABNORMAL HIGH (ref 2.5–4.6)

## 2020-08-12 LAB — APTT
aPTT: 43 seconds — ABNORMAL HIGH (ref 24–36)
aPTT: 101 seconds — ABNORMAL HIGH (ref 24–36)

## 2020-08-12 LAB — PROCALCITONIN: Procalcitonin: 0.74 ng/mL

## 2020-08-12 MED ORDER — COSYNTROPIN 0.25 MG IJ SOLR
0.2500 mg | Freq: Once | INTRAMUSCULAR | Status: AC
  Administered 2020-08-13: 0.25 mg via INTRAVENOUS
  Filled 2020-08-12: qty 0.25

## 2020-08-12 MED ORDER — SODIUM ZIRCONIUM CYCLOSILICATE 5 G PO PACK
10.0000 g | PACK | Freq: Once | ORAL | Status: AC
  Administered 2020-08-12: 10 g via ORAL
  Filled 2020-08-12: qty 2

## 2020-08-12 MED ORDER — HEPARIN (PORCINE) 25000 UT/250ML-% IV SOLN
1500.0000 [IU]/h | INTRAVENOUS | Status: DC
  Administered 2020-08-12 – 2020-08-13 (×2): 750 [IU]/h via INTRAVENOUS
  Administered 2020-08-15: 950 [IU]/h via INTRAVENOUS
  Administered 2020-08-16: 1500 [IU]/h via INTRAVENOUS
  Filled 2020-08-12 (×4): qty 250

## 2020-08-12 MED ORDER — CHLORHEXIDINE GLUCONATE CLOTH 2 % EX PADS
6.0000 | MEDICATED_PAD | Freq: Every day | CUTANEOUS | Status: DC
  Administered 2020-08-12 – 2020-08-18 (×4): 6 via TOPICAL

## 2020-08-12 MED ORDER — PREDNISONE 5 MG PO TABS
5.0000 mg | ORAL_TABLET | Freq: Every day | ORAL | Status: DC
  Administered 2020-08-12 – 2020-08-16 (×5): 5 mg via ORAL
  Filled 2020-08-12 (×6): qty 1

## 2020-08-12 MED ORDER — SERTRALINE HCL 50 MG PO TABS
50.0000 mg | ORAL_TABLET | Freq: Every day | ORAL | Status: DC
  Administered 2020-08-12 – 2020-08-16 (×5): 50 mg via ORAL
  Filled 2020-08-12 (×6): qty 1

## 2020-08-12 MED ORDER — NEPRO/CARBSTEADY PO LIQD
237.0000 mL | Freq: Three times a day (TID) | ORAL | Status: DC
  Administered 2020-08-12 – 2020-08-16 (×14): 237 mL via ORAL

## 2020-08-12 MED ORDER — TACROLIMUS 1 MG PO CAPS
2.0000 mg | ORAL_CAPSULE | Freq: Two times a day (BID) | ORAL | Status: DC
  Administered 2020-08-12 – 2020-08-17 (×12): 2 mg via ORAL
  Filled 2020-08-12 (×12): qty 2

## 2020-08-12 MED ORDER — SODIUM CHLORIDE 0.9% FLUSH: 10.0000 mL | INTRAVENOUS | Status: DC | PRN

## 2020-08-12 MED ORDER — CHLORHEXIDINE GLUCONATE CLOTH 2 % EX PADS
6.0000 | MEDICATED_PAD | Freq: Every day | CUTANEOUS | Status: DC
  Administered 2020-08-12 – 2020-08-14 (×4): 6 via TOPICAL

## 2020-08-12 MED ORDER — RENA-VITE PO TABS
1.0000 | ORAL_TABLET | Freq: Every day | ORAL | Status: DC
  Administered 2020-08-12 – 2020-08-16 (×5): 1 via ORAL
  Filled 2020-08-12 (×6): qty 1

## 2020-08-12 MED ORDER — SODIUM CHLORIDE 0.9% FLUSH
10.0000 mL | Freq: Two times a day (BID) | INTRAVENOUS | Status: DC
  Administered 2020-08-12 – 2020-08-20 (×14): 10 mL

## 2020-08-12 MED ORDER — MIDODRINE HCL 5 MG PO TABS
10.0000 mg | ORAL_TABLET | Freq: Three times a day (TID) | ORAL | Status: DC
  Administered 2020-08-12 – 2020-08-16 (×7): 10 mg via ORAL
  Filled 2020-08-12 (×10): qty 2

## 2020-08-12 NOTE — Plan of Care (Signed)
  Problem: Education: Goal: Knowledge of General Education information will improve Description: Including pain rating scale, medication(s)/side effects and non-pharmacologic comfort measures Outcome: Progressing   Problem: Health Behavior/Discharge Planning: Goal: Ability to manage health-related needs will improve Outcome: Not Progressing   Problem: Clinical Measurements: Goal: Ability to maintain clinical measurements within normal limits will improve Outcome: Progressing Goal: Diagnostic test results will improve Outcome: Not Progressing Goal: Respiratory complications will improve Outcome: Progressing   Problem: Activity: Goal: Risk for activity intolerance will decrease Outcome: Not Progressing   Problem: Nutrition: Goal: Adequate nutrition will be maintained Outcome: Not Progressing   Problem: Coping: Goal: Level of anxiety will decrease Outcome: Progressing   Problem: Pain Managment: Goal: General experience of comfort will improve Outcome: Not Progressing

## 2020-08-12 NOTE — Progress Notes (Signed)
[place] for heparin Indication: atrial fibrillation  Allergies  Allergen Reactions  . Metformin And Related Other (See Comments)    Has kidney problems    Patient Measurements: Height: 6' (182.9 cm) (from previous records) Weight: 65 kg (143 lb 4.8 oz) IBW/kg (Calculated) : 77.6  Vital Signs: Temp: 97.3 F (36.3 C) (03/26 1830) Temp Source: Axillary (03/26 1830) BP: 152/135 (03/26 2015) Pulse Rate: 100 (03/26 2015)  Labs: Recent Labs    07/20/2020 1328 07/27/2020 1336 07/31/2020 1852 08/12/20 0259 08/12/20 1133 08/12/20 1841  HGB 7.9* 8.2* 7.4* 7.8*  --   --   HCT 25.9* 24.0* 25.9* 25.8*  --   --   PLT 168  --  174 184  --   --   APTT  --   --   --   --  43* 101*  CREATININE 6.59* 6.50* 6.61* 6.63*  --   --     Estimated Creatinine Clearance: 10.1 mL/min (A) (by C-G formula based on SCr of 6.63 mg/dL (H)).  Assessment: 65 yo m presenting w SOB - afib pta on apixaban, He is on heparin - likely will need a temp cath then a tunneled HD cath -aPTT at goal   Goal of Therapy:  Heparin level 0.3-0.7 units/ml aPTT 66 - 102 seconds Monitor platelets by anticoagulation protocol: Yes   Plan:  -Continue heparin at 750 units/hr -Recheck heparin level and CBC in am  [name], PharmD Clinical Pharmacist **Pharmacist phone directory can now be found on [email or web address] ([email or web address]).  Listed under [place].

## 2020-08-12 NOTE — Progress Notes (Signed)
  Echocardiogram 2D Echocardiogram has been performed.  Paul [name] 08/12/2020, 10:50 AM

## 2020-08-12 NOTE — Consult Note (Signed)
Palliative Medicine Inpatient Consult Note  Reason for consult:  Goals of care, multiple advanced and significant chronic illnesses, progressive decline.  HPI:  Per intake H&P --> 65 yo M extensive PMH including ESRD s/p failed renal transplant - on THSa iHD, chronic immunosuppression, systolic heart failure, Afib on eliquis, duodenal cancer s/p whipple (2002), osteomyelitis and critical limb ischemia s/p Bilat BKAs, COVID, recent enterococcus bacteremia requiring hospitalization, who presented to ED 3/25 from [facility] home with SOB. SOB has increased over the past week -- associated with missing HD x3 due to inability to access the patient's RUE AVF. Pt hypotensive in ED with SBPs 70s-- received small IVF bolus and started on levo. CXR revealed large L pleural effusion. CMP obtained which did not reveal any absolute indications for emergent HD. CBC with WBC 6.5, hgb 7.9, plt 168. LA 2.2"   CT placed for pleural effusion, awaiting cytology results.  Malnutrition, DM2  Clinical Assessment/Goals of Care: I have reviewed medical records including EPIC notes, labs and imaging, received report from bedside RN Shea Stakes, and assessed the patient.    I met with Mr. Sahr wife Ivey Cina [phone]) and daughter Serita Butcher [phone] to further discuss diagnosis prognosis, GOC, EOL wishes, disposition and options. Although Mr. Bramer is groggy and can talk, his mental status and fatigue did not allow for participation in a discussion.   I introduced Palliative Medicine as specialized medical care for people living with serious illness. It focuses on providing relief from the symptoms and stress of a serious illness. The goal is to improve quality of life for both the patient and the family.  The family is familiar with Hospice as the wife's father was in hospice before dying. I clarified palliative medicine.   Mr. France lives in [place] but had been in a SNF for the last 3  weeks after a long hospitalization. 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 He is of the [employer]. The family cleaned out his SNF room today to release it to someone else. The patient has 2 sons and one daughter. [name] is a Community education officer and just moved to [place] a week ago. He will arrive tomorrow to see his father and help his mother. Son, [name] is a pilot and they are trying to reach him. He lives in [place] but flies out of [place]. Daughter Nira Conn lives in [place] and over the last few months they have been working on moving her mother to [place] as Nira Conn has 3 small children. Being the closest sibling the family thought this would be better.  Mrs. Prettyman is in the process of putting her house in [place] on the market.  They are feeling a lot of stress. They shared Mr. Speegle has been in the hospital last August and then in January. They are aware of his decline and stated he was taken off the transplant list for a retransplantation as he was not taking care of himself.   Mr.  Mincey has an advance directive. The wife states she is his designated MPOA. She will bring in a copy for his record.   A detailed discussion was had today regarding advanced directives.  Concepts specific to code status, artifical feeding and hydration, continued IV antibiotics and rehospitalization was had.  The difference between a aggressive medical intervention path  and a palliative comfort care path for this patient at this time was had. Values and goals of care important to patient and family were  attempted to be elicited.She states in previous conversation he would not want CPR and would not want to be supported on machines long term. She also stated he would not want a feeding tube. For now continued antibiotics, IV fluids and dialysis are wanted.  Discussed the importance of continued conversation with family and their medical providers regarding overall plan of care and treatment  options, ensuring decisions are within the context of the patients values and GOCs. We agreed to another conversation once the cytology report is back. The family will bring in the advance directive and they have our card to reach out to [place] as needed. We will continue to follow his chart and progress for future conversations as well.  Provided Wife and daughter the Hard Choices for [employer]" booklet.   Decision Maker: Wife, Anacleto Batterman [phone]  SUMMARY OF RECOMMENDATIONS    Code Status/Advance Care Planning:  DNAR/DNI   Symptom Management:   Depresison- sertaline   Palliative Prophylaxis:   Constipation: Colace,polythylene glycol  Malnutirition: feeding supplement  GI Prophylaxis- pantoprazole  Psycho-social/Spiritual:   Desire for further Chaplaincy support: Yes, order placed  Additional Recommendations: NO Tube feedings per wife, Family support   Palliative medicine to follow for continued conversation once cytology report is available or patient has any change in condition.    Prognosis: At risk of deterioration without pressors and midodrine. Has been in continual decline since January.  Discharge Planning: To be determined. Continued conversations needed with family once cytology report is back. If he is discharged placement in the [facility] area may be desired by the family.    Vitals with BMI 08/12/2020 08/12/2020 08/12/2020  Height - - _0   Weight - - -  BMI - - -  Systolic 82 82 99  Diastolic 67 62 74  Pulse 438 108 117    PPS: 30%   This conversation/these recommendations were discussed with patient primary care team, Dr. Erin Fulling via chart.  Thank you for the opportunity to participate in the care of this patient and family.   Time  10:44-10:50, 12:55-14:00  Total Time: >70 minutes Greater than 50%  of this time was spent counseling and coordinating care related to the above assessment and plan.  Lindell Spar, NP [facility] Palliative  Medicine Team Team Cell Phone: [phone] Please utilize secure chat with additional questions, if there is no response within 30 minutes please call the above phone number  Palliative Medicine Team providers are available by phone from 7am to 7pm daily and can be reached through the team cell phone.  Should this patient require assistance outside of these hours, please call the patient's attending physician.

## 2020-08-12 NOTE — Progress Notes (Signed)
ANTICOAGULATION CONSULT NOTE - Initial Consult  Pharmacy Consult for heparin Indication: atrial fibrillation  Allergies  Allergen Reactions  . Metformin And Related Other (See Comments)    Has kidney problems    Patient Measurements: Weight: 65 kg (143 lb 4.8 oz)  Vital Signs: Temp: 97.1 F (36.2 C) (03/26 0758) Temp Source: Oral (03/26 0758) BP: 91/51 (03/26 0825) Pulse Rate: 113 (03/26 0825)  Labs: Recent Labs    07/30/2020 1328 08/14/2020 1336 08/14/2020 1852 08/12/20 0259  HGB 7.9* 8.2* 7.4* 7.8*  HCT 25.9* 24.0* 25.9* 25.8*  PLT 168  --  174 184  CREATININE 6.59* 6.50* 6.61* 6.63*    Estimated Creatinine Clearance: 10.1 mL/min (A) (by C-G formula based on SCr of 6.63 mg/dL (H)).  Assessment: 65 yo m presenting w SOB - afib pta on apixaban  To start heparin - likely will need tunneled HD cath  Cbc low but stable  Goal of Therapy:  Heparin level 0.3-0.7 units/ml aPTT 66 - 102 seconds Monitor platelets by anticoagulation protocol: Yes   Plan:  Dc subcu hep Add hep gtt 750 units/hr - no bolus  Aptt 1800 Daily hep lvl aptt cbc  Barth Kirks, PharmD, BCCCP Clinical Pharmacist [phone]  Please check AMION for all [place] numbers  08/12/2020 9:29 AM

## 2020-08-12 NOTE — Progress Notes (Signed)
PT Cancellation Note  Patient Details Name: Paul [name] MRN: [id number] DOB: 04-Dec-1955   Cancelled Treatment:    Reason Eval/Treat Not Completed: Patient not medically ready. Pt with continued hypotension. Current BP 82/67. Still awaiting dialysis. Spoke with RN who reports pt likely too weak to tolerate PT eval until after HD re-initiated. PT to follow and proceed with eval when pt able to participate.   Lorriane Shire 08/12/2020, 1:08 PM   Lorrin Goodell, PT  Office # [phone] Pager [phone]

## 2020-08-12 NOTE — Progress Notes (Addendum)
Initial Nutrition Assessment  DOCUMENTATION CODES:   Not applicable  INTERVENTION:    Nepro Shake po TID, each supplement provides 425 kcal and 19 grams protein.  Renal MVI daily.  NUTRITION DIAGNOSIS:   Increased nutrient needs related to chronic illness (ESRD on HD) as evidenced by estimated needs.  GOAL:   Patient will meet greater than or equal to 90% of their needs  MONITOR:   PO intake,Supplement acceptance,Labs  REASON FOR ASSESSMENT:   Consult Poor PO  ASSESSMENT:   65 yo male admitted with SOB. Recently missed 3 HD sessions due to inability to access RUE AVF. PMH includes ESRD on HD, failed renal transplant, HF, HTN, DM, neuropathy, A fib, duodenal cancer s/p Whipple 2002, osteomyelitis, calciphylaxis, bilateral BKA, COVID, recent enterococcus bacteremia.   AVF was declotted on 3/24. Plans to begin HD or CRRT today.  Patient is currently on a heart healthy CHO modified diet. He is eating poorly. Consumed 0% of breakfast today. Would benefit from PO supplements to maximize protein and calorie intake.   Labs reviewed. Phos 7 CBG: 119-129  Medications reviewed and include Creon, prednisone, Lokelma.  Usual weights reviewed. No significant weight changes noted recently. Weight seems to fluctuate up and down, likely related to fluid status with ESRD on HD. EDW 66 kg, currently 1 kg below EDW.  NUTRITION - FOCUSED PHYSICAL EXAM:  unable to complete  Diet Order:   Diet Order            Diet heart healthy/carb modified Room service appropriate? Yes; Fluid consistency: Thin  Diet effective now                 EDUCATION NEEDS:   Not appropriate for education at this time  Skin:  Skin Assessment: Reviewed RN Assessment (skin tear R arm)  Last BM:  3/26 type 6  Height:   Ht Readings from Last 1 Encounters:  08/12/20 6' (1.829 m)    Weight:   Wt Readings from Last 1 Encounters:  08/12/20 65 kg    Ideal Body Weight:  70.4 kg (adjusted for  bilateral BKA)  BMI:  22.3 (adjusted for bilateral BKA)  Estimated Nutritional Needs:   Kcal:  2751-7001  Protein:  85-105 gm  Fluid:  1 L + UOP    Lucas Mallow, RD, LDN, CNSC Please refer to Amion for contact information.

## 2020-08-12 NOTE — Progress Notes (Signed)
NAME:  Paul [name], MRN:  [id number], DOB:  [birth year], LOS: 1 ADMISSION DATE:  07/28/2020, CONSULTATION DATE:  07/28/2020 REFERRING MD:  Ron Parker- EM, CHIEF COMPLAINT:  SOB  History of Present Illness:  65 yo M extensive PMH including ESRD s/p failed renal transplant - on THSa iHD, chronic immunosuppression, systolic heart failure, Afib on eliquis, duodenal cancer s/p whipple (2002), osteomyelitis and critical limb ischemia s/p Bilat BKAs, COVID, recent enterococcus bacteremia requiring hospitalization, who presented to ED 3/25 from [place] home with SOB. SOB has increased over the past week -- associated with missing HD x3 due to inability to access the patient's RUE AVF. Pt hypotensive in ED with SBPs 70s-- received small IVF bolus and started on levo. CXR revealed large L pleural effusion. CMP obtained which did not reveal any absolute indications for emergent HD. CBC with WBC 6.5, hgb 7.9, plt 168. LA 2.2   Critical care consulted for admission  Pertinent  Medical History  Afib on eliquis Duodenal cancer s/p whipple ESRD Failed renal transplant Chronic immunosuppression  DM Gangrene Osteomyelitis Critical limb ischemia calciphylaxis S/p bilateral BKAs  HTN  COVID Enterococcus bacteremia  Combined systolic and diastolic heart failure, EF 25-30% Pleural effusion  IDDM  Significant Hospital Events: Including procedures, antibiotic start and stop dates in addition to other pertinent events   . 3/25 presented to ED from nursing home with SOB after multiple missed HD runs due to inability to access fistula. Hypotensive in ED, started on pressors. CXR with large L pleural effusion. Blood cx sent. Started on empiric abx for possible sepsis. Admitting to ICU, Planning for temp HD cath placement and likely pigtail placement   Interim History / Subjective:  No events. Not eating well. Bps remain borderline.  Objective   Blood pressure (!) 91/51, pulse (!) 113, temperature  (!) 97.1 F (36.2 C), temperature source Oral, resp. rate (!) 25, weight 65 kg, SpO2 100 %.        Intake/Output Summary (Last 24 hours) at 08/12/2020 0913 Last data filed at 08/12/2020 3785 Gross per 24 hour  Intake 1992.39 ml  Output 2750 ml  Net -757.61 ml   Filed Weights   07/22/2020 1332 08/12/20 0426  Weight: (S) 79.4 kg 65 kg    Examination: Constitutional: chronically ill appearing man lying in bed  Eyes: EOMI, pupils equal Ears, nose, mouth, and throat: trachea midline, MMM Cardiovascular: irregular, upper ext warm, bilateral BKA Respiratory: diminished on L, no tidaling or air leak in atrium Gastrointestinal: soft +BS Skin: No rashes, normal turgor Neurologic: Moves all 4 ext to command Psychiatric: RASS 0, seems depressed    Labs/imaging that I havepersonally reviewed  (right click and "Reselect all SmartList Selections" daily)  Stable CBC CXR with likely entrapment on L K slightly up BUN/Cr stable  Resolved Hospital Problem list    Assessment & Plan:  Question septic shock vs. Acute on chronic renal vasoplegia- on abx, borderline pressures remain.  I am not sure there is an infection here. Left effusion- lymphocyte predominant transudate.  F/u cyto.  Suspect related to longterm HD rather than infection. Hx CHF- would recheck this, if EF still down may benefit from [facility]; if thought not option needs hospice Hx renal transplant, failed, remains on immunosuppression, HD Poor PO, severe protein calorie malnutrition FTT DM2- sugars fine here with SSI Hx duodenal adeno post whipple Depression Hx of  Afib on AC PTA Poor AVF function- will need addressed at some point  - Chest tube  to water seal, AM CXR - DC vanc, continue cefepime, check Pct - Start midodrine, heparin gtt - Lokelma x 1, no immediate need for HD, would prefer if he could try iHD instead of CRRT; nephro following - Encourage PO, may need cortrak - Continue PTA immunosuppressants - Check limited  echo - Palliative to see, may need to have some tough conversations  Best practice (right click and "Reselect all SmartList Selections" daily)  Diet:  Oral Pain/Anxiety/Delirium protocol (if indicated): No VAP protocol (if indicated): Not indicated DVT prophylaxis: Systemic AC GI prophylaxis: PPI Glucose control:  SSI Yes Central venous access:  Yes, and it is still needed Arterial line:  N/A Foley:  N/A Mobility:  OOB  PT consulted: Yes Last date of multidisciplinary goals of care discussion: discussed with pt 3/25.  Code Status:  full code Disposition: Admit to ICU    Patient critically ill due to shock Interventions to address this today pressor titration, midodrine Risk of deterioration without these interventions is high  I personally spent 37 minutes providing critical care not including any separately billable procedures  Erskine Emery MD [place] [facility]  Prefer epic messenger for cross cover needs If after hours, please call E-link

## 2020-08-12 NOTE — Progress Notes (Signed)
OT Cancellation Note  Patient Details Name: Paul [name] MRN: [id number] DOB: Sep 03, 1955   Cancelled Treatment:    Reason Eval/Treat Not Completed: Patient not medically ready (Pt with continued hypotension. Current BP 82/67. Still awaiting dialysis. Spoke with RN who reports pt likely too weak to tolerate OT eval. OT to follow and proceed with eval when/if pt medically appropriate.)   Jefferey Pica, OTR/L [place] Pager: [phone] Office: [phone]   Joselle Deeds C 08/12/2020, 2:25 PM

## 2020-08-12 NOTE — Progress Notes (Signed)
[facility] Progress Note  Subjective: seen in ICU  Vitals:   08/12/20 0758 08/12/20 0800 08/12/20 0825 08/12/20 1000  BP:  (!) 69/44 (!) 91/51   Pulse:  (!) 115 (!) 113   Resp:  19 (!) 25   Temp: (!) 97.1 F (36.2 C)     TempSrc: Oral     SpO2:  100% 100%   Weight:      Height:    6' (1.829 m)    Exam:   alert, nad, tired  no jvd  Chest cta bilat  Cor reg no RG  Abd soft ntnd no ascites   Ext bilat BKA, no edema   NF, ox3    RUE AVF - no bruit    OP HD: [facility] T[facility]     3.5h  400/500   66kg  3K/2.5Ca bath  AVF  Hep none  - calcitriol 1 tiw  - venofer 100 x 5 , started 3/19  - mircera 100 q2 (last dose during prior admit)   Assessment/ Plan: 1. Hypotension - suspetec septic shock. Recent  admission w Enterococcus bacteremia. Large left pleural effusion on CXR. SP chest tube placement w/ ^^drainage. Cultures pending.  Empiric antibiotics per PCCM.  2. AFib RVR --Usual meds Eliquis, metoprolol - Holding w sepsis w/u  3. ESRD -  HD TTS. Missed HD x 2 this week d/t clotted access. S/p declot at [facility] 3/24 but appears clotted again, possibly 2/2 to hypotension. No urgent dialysis indications today. Will likely need temp cath.   4. Hypotension/volume  - Shock as above. Pressor support per PCCM. No vol ^ or edema on exam, at dry wt. Keep even w/ HD today .  5. Anemia  - On ESA as outpatient.  Restart ESA with next HD here. Hold IV Fe secondary to sepsis w/u   6. Metabolic bone disease -  Continue Auryixa binders/VDRA when taking PO  7. T1DM - Insulin per primary      Paul [name] 08/12/2020, 10:19 AM   Recent Labs  Lab 07/28/2020 1328 08/08/2020 1336 07/23/2020 1852 08/12/20 0259  K 4.8 4.7  --  5.0  BUN 76* 76*  --  79*  CREATININE 6.59* 6.50* 6.61* 6.63*  CALCIUM 7.6*  --   --  8.0*  PHOS  --   --   --  7.0*  HGB 7.9* 8.2* 7.4* 7.8*   Inpatient medications: . Chlorhexidine Gluconate Cloth  6 each Topical Daily  . [START ON 08/13/2020] cosyntropin  0.25 mg  Intravenous Once  . feeding supplement (NEPRO CARB STEADY)  237 mL Oral TID BM  . insulin aspart  0-9 Units Subcutaneous Q4H  . lipase/protease/amylase  12,000 Units Oral TID AC  . midodrine  10 mg Oral TID WC  . multivitamin  1 tablet Oral QHS  . pantoprazole (PROTONIX) IV  40 mg Intravenous QHS  . predniSONE  5 mg Oral Q breakfast  . sertraline  50 mg Oral QHS  . sodium chloride flush  10-40 mL Intracatheter Q12H  . sodium zirconium cyclosilicate  10 g Oral Once  . tacrolimus  2 mg Oral BID   . ceFEPime (MAXIPIME) IV Stopped (08/12/2020 1520)  . heparin    . norepinephrine (LEVOPHED) Adult infusion     docusate sodium, heparin, polyethylene glycol, sodium chloride flush

## 2020-08-13 ENCOUNTER — Inpatient Hospital Stay (HOSPITAL_COMMUNITY): Payer: Medicare Other

## 2020-08-13 DIAGNOSIS — I4891 Unspecified atrial fibrillation: Secondary | ICD-10-CM | POA: Diagnosis not present

## 2020-08-13 DIAGNOSIS — J90 Pleural effusion, not elsewhere classified: Secondary | ICD-10-CM | POA: Diagnosis not present

## 2020-08-13 DIAGNOSIS — I5020 Unspecified systolic (congestive) heart failure: Secondary | ICD-10-CM | POA: Diagnosis not present

## 2020-08-13 DIAGNOSIS — D84821 Immunodeficiency due to drugs: Secondary | ICD-10-CM | POA: Diagnosis not present

## 2020-08-13 DIAGNOSIS — Z515 Encounter for palliative care: Secondary | ICD-10-CM

## 2020-08-13 LAB — GLUCOSE, CAPILLARY
Glucose-Capillary: 101 mg/dL — ABNORMAL HIGH (ref 70–99)
Glucose-Capillary: 128 mg/dL — ABNORMAL HIGH (ref 70–99)
Glucose-Capillary: 149 mg/dL — ABNORMAL HIGH (ref 70–99)
Glucose-Capillary: 158 mg/dL — ABNORMAL HIGH (ref 70–99)
Glucose-Capillary: 189 mg/dL — ABNORMAL HIGH (ref 70–99)
Glucose-Capillary: 152 mg/dL — ABNORMAL HIGH (ref 70–99)

## 2020-08-13 LAB — ACTH STIMULATION, 3 TIME POINTS
Cortisol, 30 Min: 21.1 ug/dL
Cortisol, 60 Min: 24.4 ug/dL
Cortisol, Base: 11.6 ug/dL

## 2020-08-13 LAB — CBC
HCT: 26 % — ABNORMAL LOW (ref 39.0–52.0)
Hemoglobin: 7.8 g/dL — ABNORMAL LOW (ref 13.0–17.0)
MCH: 29 pg (ref 26.0–34.0)
MCHC: 30 g/dL (ref 30.0–36.0)
MCV: 96.7 fL (ref 80.0–100.0)
Platelets: 146 10*3/uL — ABNORMAL LOW (ref 150–400)
RBC: 2.69 MIL/uL — ABNORMAL LOW (ref 4.22–5.81)
RDW: 16.2 % — ABNORMAL HIGH (ref 11.5–15.5)
WBC: 6.7 10*3/uL (ref 4.0–10.5)
nRBC: 0 % (ref 0.0–0.2)

## 2020-08-13 LAB — BASIC METABOLIC PANEL
Anion gap: 8 (ref 5–15)
BUN: 34 mg/dL — ABNORMAL HIGH (ref 8–23)
CO2: 28 mmol/L (ref 22–32)
Calcium: 7.7 mg/dL — ABNORMAL LOW (ref 8.9–10.3)
Chloride: 99 mmol/L (ref 98–111)
Creatinine, Ser: 3.65 mg/dL — ABNORMAL HIGH (ref 0.61–1.24)
GFR, Estimated: 18 mL/min — ABNORMAL LOW (ref >60)
Glucose, Bld: 106 mg/dL — ABNORMAL HIGH (ref 70–99)
Potassium: 3.5 mmol/L (ref 3.5–5.1)
Sodium: 135 mmol/L (ref 135–145)

## 2020-08-13 LAB — HEPARIN LEVEL (UNFRACTIONATED): Heparin Unfractionated: 0.67 IU/mL (ref 0.30–0.70)

## 2020-08-13 MED ORDER — METOPROLOL TARTRATE 25 MG PO TABS
25.0000 mg | ORAL_TABLET | Freq: Two times a day (BID) | ORAL | Status: DC
  Administered 2020-08-13 – 2020-08-16 (×8): 25 mg via ORAL
  Filled 2020-08-13 (×8): qty 1

## 2020-08-13 MED ORDER — SODIUM CHLORIDE 0.9% FLUSH
10.0000 mL | Freq: Three times a day (TID) | INTRAVENOUS | Status: DC
  Administered 2020-08-13: 10 mL

## 2020-08-13 MED ORDER — SODIUM CHLORIDE 0.9% FLUSH
10.0000 mL | Freq: Three times a day (TID) | INTRAVENOUS | Status: DC
  Administered 2020-08-13 – 2020-08-20 (×15): 10 mL

## 2020-08-13 MED ORDER — ALTEPLASE 2 MG IJ SOLR
2.0000 mg | Freq: Once | INTRAMUSCULAR | Status: AC
  Administered 2020-08-13: 2 mg
  Filled 2020-08-13: qty 2

## 2020-08-13 NOTE — Progress Notes (Signed)
Daily Progress Note   Patient Name: Paul [name]       Date: 08/13/2020 DOB: 1956/03/01  Age: 65 y.o. MRN#: [id number] Attending Physician: Freddi Starr, MD Primary Care Physician: [name], MD Admit Date: 07/26/2020  Reason for Consultation/Follow-up: Establishing goals of care  Subjective: Patient awake, alert, oriented. Denies complaints but speaks of the 'discomfort' with his situation at [facility] rehab following last admission. He is off levo. He appears very weak and plans to rest this afternoon until his family visits. Per RN, wife, son, and daughter will be at bedside this afternoon.   This AM, Wife provided his documented living will and HCPOA paperwork. Copy placed into paper chart. Patient confirms his decision for DNR code status. He mentions hoping to speak with his family this afternoon. Encouraged RN to call (or have wife call PMT contact number) if/when family is ready to talk this afternoon. If not today, explained to RN and patient that I could meet with them any time tomorrow, Monday 3/28. Support provided.   Length of Stay: 2  Current Medications: Scheduled Meds:  . Chlorhexidine Gluconate Cloth  6 each Topical Daily  . Chlorhexidine Gluconate Cloth  6 each Topical Q0600  . feeding supplement (NEPRO CARB STEADY)  237 mL Oral TID BM  . insulin aspart  0-9 Units Subcutaneous Q4H  . lipase/protease/amylase  12,000 Units Oral TID AC  . metoprolol tartrate  25 mg Oral BID  . midodrine  10 mg Oral TID WC  . multivitamin  1 tablet Oral QHS  . pantoprazole (PROTONIX) IV  40 mg Intravenous QHS  . predniSONE  5 mg Oral Q breakfast  . sertraline  50 mg Oral QHS  . sodium chloride flush  10 mL Intracatheter Q8H  . sodium chloride flush  10-40 mL Intracatheter  Q12H  . tacrolimus  2 mg Oral BID    Continuous Infusions: . heparin 750 Units/hr (08/13/20 1500)  . norepinephrine (LEVOPHED) Adult infusion Stopped (08/13/20 0016)    PRN Meds: docusate sodium, heparin, polyethylene glycol, sodium chloride flush  Physical Exam Vitals and nursing note reviewed.  Constitutional:      General: He is awake.  HENT:     Head: Normocephalic and atraumatic.  Pulmonary:     Effort: No tachypnea, accessory muscle usage or respiratory distress.  Skin:    General: Skin is warm and dry.  Neurological:     Mental Status: He is alert and oriented to person, place, and time.  Psychiatric:        Mood and Affect: Mood normal.        Speech: Speech normal.        Cognition and Memory: Cognition normal.            Vital Signs: BP 115/88   Pulse (!) 131   Temp 98 F (36.7 C) (Oral)   Resp 17   Ht 6' (1.829 m) Comment: from previous records  Wt 65.6 kg   SpO2 (!) 78%   BMI 19.61 kg/m  SpO2: SpO2: (!) 78 % O2 Device: O2 Device: Nasal Cannula O2 Flow Rate: O2 Flow Rate (L/min): 3 L/min  Intake/output summary:   Intake/Output Summary (Last 24 hours) at 08/13/2020 1527 Last data filed at 08/13/2020 1500 Gross per 24 hour  Intake 571.34 ml  Output 100 ml  Net 471.34 ml   LBM: Last BM Date: 08/12/20 Baseline Weight: Weight: (S) 79.4 kg (66kg dry weight without prosthetics- prosthetics weigh 5.8kg) Most recent weight: Weight: 65.6 kg       Palliative Assessment/Data: PPS 40%      Patient Active Problem List   Diagnosis Date Noted  . Immunosuppression due to drug therapy ([place]) 08/02/2020  . History of Whipple procedure 08/05/2020  . Heart failure with reduced ejection fraction ([place]) 08/13/2020  . PAF (paroxysmal atrial fibrillation) ([place]) 06/11/2020  . Chronic combined systolic and diastolic CHF (congestive heart failure) ([place]) 06/11/2020  . Labile blood glucose   . Diabetic peripheral neuropathy ([place])   . Anemia of chronic disease   .  Right below-knee amputee ([place]) 01/18/2020  . Amputation stump infection ([place])   . Subacute osteomyelitis of right tibia ([place])   . Dehiscence of amputation stump ([place])   . Sepsis ([place]) 01/04/2020  . Cellulitis 12/31/2019  . CKD stage 4 due to type 2 diabetes mellitus ([place]) 04/29/2019  . AKI (acute kidney injury) ([place]) 04/24/2019  . Hyperglycemia 04/24/2019  . Nausea and vomiting 04/24/2019  . Fever 04/23/2019  . Acute metabolic encephalopathy 61/44/3154  . Stroke ([place]) 10/28/2018  . Middle cerebral artery embolism, left 10/28/2018  . Metabolic acidosis 00/86/7619  . Dehydration 05/24/2018  . Stage II pressure ulcer ([place]) 05/24/2018  . Dyspnea 05/23/2018  . Acute respiratory failure with hypoxia ([place]) 05/23/2018  . Abnormal cardiovascular stress test 12/29/2013  . Awaiting organ transplant 12/09/2013  . Leg ulcer ([place]) 11/17/2013  . Chronic kidney disease (CKD), stage V ([place]) 11/17/2013  . Diabetes ([place]) 11/17/2013  . Cancer of duodenum ([place]) 11/17/2013  . Dyslipidemia 11/17/2013  . Chronic kidney disease requiring chronic dialysis ([place]) 11/17/2013  . Hypercholesteremia 11/17/2013  . H/O malignant neoplasm 11/17/2013  . H/O: HTN (hypertension) 11/17/2013  . BP (high blood pressure) 11/17/2013  . Hypotension 11/17/2013  . Angiopathy, peripheral ([place]) 11/17/2013  . AF (paroxysmal atrial fibrillation) ([place]) 11/17/2013  . BKA stump complication ([place]) 50/93/2671  . S/P bilateral BKA (below knee amputation) ([place]) 08/03/2013  . S/P BKA (below knee amputation) bilateral ([place]) 07/30/2013  . Open wound of both legs with complication 24/58/0998  . Acute blood loss anemia 07/12/2013  . Syncope 07/09/2013  . Intolerance to CAPD peritoneal dialysis s/p CAPD cath removal 07/09/2013 07/05/2013  . Critical lower limb ischemia ([place]) 06/30/2013  . Extremity pain 06/30/2013  . Atherosclerosis of native arteries of the extremities with ulceration(440.23)  06/17/2013  . Gait disorder 05/31/2013  .  Weakness 05/24/2013  . Depression 04/03/2013  . GERD (gastroesophageal reflux disease) 04/03/2013  . Atrial fibrillation ([place]) 04/02/2013  . DM (diabetes mellitus) ([place]) 12/28/2012  . Other complications due to renal dialysis device, implant, and graft 05/19/2012  . Diabetes mellitus, type 2 ([place]) 11/13/2011  . Anemia 07/02/2011  . ESRD on hemodialysis [facility]) 05/29/2011    Palliative Care Assessment & Plan   Patient Profile: Per intake H&P --> 49 yo M extensive PMH including ESRD s/p failed renal transplant - on THSa iHD, chronic immunosuppression, systolic heart failure, Afib on eliquis, duodenal cancer s/p whipple (2002), osteomyelitis and critical limb ischemia s/p Bilat BKAs, COVID, recent enterococcus bacteremia requiring hospitalization, who presented to ED 3/25 from [place] home with SOB. SOB has increased over the past week -- associated with missing HD x3 due to inability to access the patient's RUE AVF.  Pt hypotensive in ED with SBPs 70s-- received small IVF bolus and started on levo. CXR revealed large L pleural effusion. CMP obtained which did not reveal any absolute indications for emergent HD. CBC with WBC 6.5, hgb 7.9, plt 168. LA 2.2"   CT placed for pleural effusion, awaiting cytology results.  Malnutrition, DM2  Assessment: Hx of renal transplant, failed, remains on immunosuppression ESRD on hemodialysis Septic shock vs. Acute on chronic renal vasoplegia Left pleural effusion Afib RVR Severe protein calorie malnutrition Deconditioning Adult failure to thrive Hx of duodenal cancer s/p whipple Hx of CHF Hx of DM type 2  Recommendations/Plan:  Patient confirms decision for DNR code status.  Copy of living will and HCPOA paperwork placed in chart.  Patient awake, alert, oriented but no family at bedside this AM. Will f/u when family available to discuss goals of care.   Continue current plan of care and medical management.   Code Status: DNR    Code Status Orders  (From admission, onward)         Start     Ordered   08/12/20 1347  Do not attempt resuscitation (DNR)  Continuous       Question Answer Comment  In the event of cardiac or respiratory ARREST Do not call a "code blue"   In the event of cardiac or respiratory ARREST Do not perform Intubation, CPR, defibrillation or ACLS   In the event of cardiac or respiratory ARREST Use medication by any route, position, wound care, and other measures to relive pain and suffering. May use oxygen, suction and manual treatment of airway obstruction as needed for comfort.      08/12/20 1346        Code Status History    Date Active Date Inactive Code Status Order ID Comments User Context   08/17/2020 1513 08/12/2020 1346 Full Code [id number]  Garner Nash, DO ED   06/11/2020 1719 07/21/2020 0053 Full Code [id number]  Neena Rhymes, MD ED   01/18/2020 2139 01/31/2020 1755 Full Code [id number]  Cathlyn Parsons, PA-C Inpatient   01/18/2020 2139 01/18/2020 2139 Full Code [id number]  Cathlyn Parsons, PA-C Inpatient   12/31/2019 1315 01/18/2020 2108 Full Code [id number]  Lequita Halt, MD ED   04/24/2019 0158 04/29/2019 1713 Full Code [id number]  Shela Leff, MD ED   10/28/2018 1748 11/04/2018 2111 Full Code [id number]  Luanne Bras, MD Inpatient   10/28/2018 1422 10/28/2018 1748 Full Code [id number]  Marliss Coots, PA-C ED   05/24/2018 0109 05/25/2018 1832 Full Code [id number]  Toy Baker, MD Inpatient   09/03/2013 1528 09/04/2013 1350 Full Code [id number]  Newt Minion, MD Inpatient   08/03/2013 1710 08/14/2013 1700 Full Code [id number]  Elizabeth Sauer Inpatient   07/30/2013 1008 08/03/2013 1710 Full Code [id number]  Newt Minion, MD Inpatient   07/09/2013 1927 07/13/2013 2144 Full Code [id number]  Etta Quill, DO ED   05/31/2013 1602 06/11/2013 1703 Full Code [id number]  Cathlyn Parsons, PA-C Inpatient   05/24/2013 2116 05/31/2013 1602 Full Code [id number]  Theodis Blaze,  MD ED   04/02/2013 1538 04/04/2013 1724 Full Code [id number]  Elmarie Shiley, MD Inpatient   Advance Care Planning Activity    Advance Directive Documentation   Flowsheet Row Most Recent Value  Type of Advance Directive Healthcare Power of [place]- happens to be out of town this weekend]  Pre-existing out of facility DNR order (yellow form or pink MOST form) -  "MOST" Form in Place? -       Prognosis:  Poor long-term  Discharge Planning:  To Be Determined  Care plan was discussed with RN, patient, hopeful to meet with family this afternoon. If not, will attempt tomorrow.   Thank you for allowing the Palliative Medicine Team to assist in the care of this patient.   Total Time 20 Prolonged Time Billed  no      Greater than 50%  of this time was spent counseling and coordinating care related to the above assessment and plan.  Ihor Dow, DNP, FNP-C Palliative Medicine Team  Phone: [phone] Fax: [phone]  Please contact Palliative Medicine Team phone at [phone] for questions and concerns.

## 2020-08-13 NOTE — Evaluation (Signed)
Physical Therapy Evaluation Patient Details Name: Paul [name] MRN: [id number] DOB: 1956-02-13 Today's Date: 08/13/2020   History of Present Illness  65 yo M extensive PMH including ESRD s/p failed renal transplant - on THSa iHD, chronic immunosuppression, systolic heart failure, Afib on eliquis, duodenal cancer s/p whipple (2002), osteomyelitis and critical limb ischemia s/p Bilat BKAs, COVID, recent enterococcus bacteremia requiring hospitalization, who presented to ED 3/25 from [place] home with SOB. SOB has increased over the past week -- associated with missing HD x3 due to inability to access the patient's RUE AVF.  Pt hypotensive in ED with SBPs 70s-- received small IVF bolus and started on levo. CXR revealed large L pleural effusion.    Clinical Impression  Pt admitted with above diagnosis. Prior to recent hospitalization (06/11/20-07/20/20), pt was mod I mobility and ADLs with B prosthetic LEs and RW. He has been at [facility] since his discharge from the hospital. On eval, pt required +2 max assist bed mobility. Pt declined further mobility, stating he is unsure what he and his wife plan to do and not sure he wants to do therapy. Pt educated on varying role of therapy based on pt's [place]. BP supine 91/69. Erratic HR 115-150. Pt currently with functional limitations due to the deficits listed below (see PT Problem List). Pt will benefit from skilled PT to increase their independence and safety with mobility to allow discharge to the venue listed below.   Recommending SNF but pt reports he does not plan to return to SNF. If pt/family decline SNF and rehab path is still desired, recommend maximizing [facility] services and below noted DME.     Follow Up Recommendations SNF;Supervision/Assistance - 24 hour    Equipment Recommendations  Hospital bed;Wheelchair (measurements PT);Wheelchair cushion (measurements PT);3in1 (PT);Other (comment) (hoyer lift)    Recommendations for Other Services        Precautions / Restrictions Precautions Precautions: Other (comment);Fall Precaution Comments: chest tube, temp L IJ HD cath Restrictions Weight Bearing Restrictions: No      Mobility  Bed Mobility Overal bed mobility: Needs Assistance Bed Mobility: Rolling Rolling: +2 for physical assistance;Max assist         General bed mobility comments: Pt able to actively assist with scooting in bed for repositioning. Pt declining supine to sit or further mobility.    Transfers                 General transfer comment: deferred  Ambulation/Gait                Stairs            Wheelchair Mobility    Modified Rankin (Stroke Patients Only)       Balance                                             Pertinent Vitals/Pain Pain Assessment: Faces Faces Pain Scale: Hurts a little bit Pain Location: generalized with mobility Pain Descriptors / Indicators: Grimacing Pain Intervention(s): Limited activity within patient's tolerance;Repositioned    Home Living Family/patient expects to be discharged to:: Private residence Living Arrangements: Spouse/significant other Available Help at Discharge: Family;Available 24 hours/day Type of Home: House Home Access: Ramped entrance     Home Layout: Two level;Able to live on main level with bedroom/bathroom Home Equipment: Kasandra Knudsen - single point;Shower seat;Wheelchair - Water quality scientist - 2 wheels;Hand  held shower head;Grab bars - toilet;Shower seat - built in      Prior Function Level of Independence: Independent with assistive device(s)         Comments: Prior to recent hospitalization (06/11/20-07/20/20), pt ambulated mod I with B prosthetic LEs and RW. Mod I ADLs. Pt has been at SNF since d/c from hospital.     Hand Dominance   Dominant Hand: Right    Extremity/Trunk Assessment   Upper Extremity Assessment Upper Extremity Assessment: Defer to OT evaluation    Lower Extremity  Assessment Lower Extremity Assessment: Generalized weakness;RLE deficits/detail;LLE deficits/detail RLE Deficits / Details: h/o B BKAs LLE Deficits / Details: h/o B BKAs       Communication   Communication: No difficulties  Cognition Arousal/Alertness: Awake/alert;Lethargic (falls asleep easily without interaction) Behavior During Therapy: Flat affect Overall Cognitive Status: Within Functional Limits for tasks assessed                                 General Comments: appears withdrawn. Little interest in participating in therapy.      General Comments General comments (skin integrity, edema, etc.): 2 BP readings taken during session in supine, 95/75 and 91/69. Erratic HR 115-150, in/out of a fib. Bilat prosthesis gel sleeves donned and doffed during session to ensure fit.    Exercises     Assessment/Plan    PT Assessment Patient needs continued PT services  PT Problem List Decreased strength;Decreased mobility;Decreased activity tolerance;Cardiopulmonary status limiting activity;Decreased balance;Pain       PT Treatment Interventions Therapeutic activities;Therapeutic exercise;Gait training;Patient/family education;Balance training;Functional mobility training    PT Goals (Current goals can be found in the Care Plan section)  Acute Rehab PT Goals Patient Stated Goal: unsure PT Goal Formulation: With patient Time For Goal Achievement: 08/27/20 Potential to Achieve Goals: Fair    Frequency Min 2X/week   Barriers to discharge        Co-evaluation               AM-PAC PT "6 Clicks" Mobility  Outcome Measure Help needed turning from your back to your side while in a flat bed without using bedrails?: A Lot Help needed moving from lying on your back to sitting on the side of a flat bed without using bedrails?: A Lot Help needed moving to and from a bed to a chair (including a wheelchair)?: Total Help needed standing up from a chair using your arms  (e.g., wheelchair or bedside chair)?: Total Help needed to walk in hospital room?: Total Help needed climbing 3-5 steps with a railing? : Total 6 Click Score: 8    End of Session   Activity Tolerance: Patient limited by fatigue Patient left: in bed;with call bell/phone within reach;with bed alarm set Nurse Communication: Mobility status PT Visit Diagnosis: Other abnormalities of gait and mobility (R26.89);Muscle weakness (generalized) (M62.81)    Time: 0177-9390 PT Time Calculation (min) (ACUTE ONLY): 20 min   Charges:   PT Evaluation $PT Eval Moderate Complexity: 1 Mod          Lorrin Goodell, PT  Office # [phone] Pager [phone]   Lorriane Shire 08/13/2020, 11:22 AM

## 2020-08-13 NOTE — Progress Notes (Signed)
Still some fluid on L, will flush tube and place to suction. May be worth keeping in ICU one more day but will discuss with Dr. Erin Fulling.  Erskine Emery MD PCCM

## 2020-08-13 NOTE — Progress Notes (Signed)
[place] for heparin Indication: atrial fibrillation  Allergies  Allergen Reactions  . Metformin And Related Other (See Comments)    Has kidney problems    Patient Measurements: Height: 6' (182.9 cm) (from previous records) Weight: 65.6 kg (144 lb 10 oz) IBW/kg (Calculated) : 77.6  Vital Signs: Temp: 97.7 F (36.5 C) (03/27 0744) Temp Source: Axillary (03/27 0744) BP: 123/89 (03/27 0600) Pulse Rate: 105 (03/27 0600)  Labs: Recent Labs    07/30/2020 1852 08/12/20 0259 08/12/20 1133 08/12/20 1841 08/13/20 0427  HGB 7.4* 7.8*  --   --  7.8*  HCT 25.9* 25.8*  --   --  26.0*  PLT 174 184  --   --  146*  APTT  --   --  43* 101*  --   HEPARINUNFRC  --   --   --   --  0.67  CREATININE 6.61* 6.63*  --   --  3.65*    Estimated Creatinine Clearance: 18.5 mL/min (A) (by C-G formula based on SCr of 3.65 mg/dL (H)).  Assessment: 65 yo m presenting w SOB - afib pta on apixaban, He is on heparin - likely will need a temp cath then a tunneled HD cath  aptt and hep lvl correlating so can stop aptt monitoring  Hep lvl within goal  Cbc low but stable  Goal of Therapy:  Heparin level 0.3-0.7 units/ml Monitor platelets by anticoagulation protocol: Yes   Plan:  -Continue heparin at 750 units/hr -daily hep lvl cbc -f/u ability to return to apixaban  Barth Kirks, PharmD, [place] Pharmacist [phone]  Please check AMION for all [place] numbers  08/13/2020 7:54 AM

## 2020-08-13 NOTE — Progress Notes (Signed)
Mariah RN notified HD pigtail cap changed and remains without blood return.  Flushes easily.

## 2020-08-13 NOTE — Progress Notes (Signed)
NAME:  Paul [name], MRN:  [id number], DOB:  19-Sep-1955, LOS: 2 ADMISSION DATE:  08/02/2020, CONSULTATION DATE:  08/14/2020 REFERRING MD:  Ron Parker- EM, CHIEF COMPLAINT:  SOB  History of Present Illness:  65 yo M extensive PMH including ESRD s/p failed renal transplant - on THSa iHD, chronic immunosuppression, systolic heart failure, Afib on eliquis, duodenal cancer s/p whipple (2002), osteomyelitis and critical limb ischemia s/p Bilat BKAs, COVID, recent enterococcus bacteremia requiring hospitalization, who presented to ED 3/25 from [place] home with SOB. SOB has increased over the past week -- associated with missing HD x3 due to inability to access the patient's RUE AVF.  Pt hypotensive in ED with SBPs 70s-- received small IVF bolus and started on levo. CXR revealed large L pleural effusion. CMP obtained which did not reveal any absolute indications for emergent HD. CBC with WBC 6.5, hgb 7.9, plt 168. LA 2.2   Critical care consulted for admission  Pertinent  Medical History  Afib on eliquis Duodenal cancer s/p whipple ESRD Failed renal transplant Chronic immunosuppression  DM Gangrene Osteomyelitis Critical limb ischemia calciphylaxis S/p bilateral BKAs  HTN  COVID Enterococcus bacteremia  Combined systolic and diastolic heart failure, EF 25-30% Pleural effusion  IDDM  Significant Hospital Events: Including procedures, antibiotic start and stop dates in addition to other pertinent events   . 3/25 presented to ED from nursing home with SOB after multiple missed HD runs due to inability to access fistula. Hypotensive in ED, started on pressors. CXR with large L pleural effusion. Blood cx sent. Started on empiric abx for possible sepsis. Admitting to ICU, Planning for temp HD cath placement and likely pigtail placement   Interim History / Subjective:  No events. Family meeting planned today. In afib/RVR Breathing is "okay."  Objective   Blood pressure 91/69,  pulse (!) 134, temperature 97.7 F (36.5 C), temperature source Axillary, resp. rate (!) 26, height 6' (1.829 m), weight 65.6 kg, SpO2 100 %.        Intake/Output Summary (Last 24 hours) at 08/13/2020 0945 Last data filed at 08/13/2020 0800 Gross per 24 hour  Intake 722.67 ml  Output 100 ml  Net 622.67 ml   Filed Weights   08/14/2020 1332 08/12/20 0426 08/13/20 0500  Weight: (S) 79.4 kg 65 kg 65.6 kg    Examination: Constitutional: chronically ill appearing man in NAD  Eyes: EOMI, pupils equal Ears, nose, mouth, and throat: temporal wasting Cardiovascular: Irregular, tachycardic, ext warm Respiratory: Diminished on L, serosanguinous output Gastrointestinal: soft, hypoactive BS Skin: No rashes, normal turgor Neurologic: moves all 4 ext, very weak, BL BKA Psychiatric: RASS 0  Labs/imaging that I havepersonally reviewed  (right click and "Reselect all SmartList Selections" daily)   Labs improved with HD Stable CBC Cortisol stim test equivocal; would not call it adrenal insufficiency CXR pending  Resolved Hospital Problem list    Assessment & Plan:  Question septic shock vs. Acute on chronic renal vasoplegia- I am going to stop antibiotics, there is no evidence of infection. Left effusion- lymphocyte predominant transudate.  F/u cyto.  Suspect related to longterm HD rather than infection. Hx CHF- EF has recovered, previous reduced EF likely just stress cardiomyopathy Afib/RVR- on metoprolol PTA Hx renal transplant, failed, remains on immunosuppression, HD Poor PO, severe protein calorie malnutrition, FTT, deconditioning- biggest issue DM2- sugars fine here with SSI Hx duodenal adeno post whipple Depression Hx of  Afib on AC PTA Poor AVF function- will need addressed at some point  -  Chest tube to water seal, f/u CXR; if lung still up would take out chest tube as minimal drainage - DC abx  - Continue midodrine, heparin gtt - Restart home metoprolol - Encourage PO,  family does not want cortrak or TF - Continue PTA immunosuppressants - Family meeting today, appreciate palliative help  Best practice (right click and "Reselect all SmartList Selections" daily)  Diet:  Oral Pain/Anxiety/Delirium protocol (if indicated): No VAP protocol (if indicated): Not indicated DVT prophylaxis: Systemic AC GI prophylaxis: PPI Glucose control:  SSI Yes Central venous access:  Yes, and it is still needed Arterial line:  N/A Foley:  N/A Mobility:  OOB  PT consulted: Yes Last date of multidisciplinary goals of care discussion: today with palliative Code Status:  full code Disposition: progressive later today after family meeting, appreciate [facility] taking over care starting 3/28  Erskine Emery MD [facility] epic messenger for cross cover needs If after hours, please call E-link

## 2020-08-13 NOTE — Evaluation (Signed)
Occupational Therapy Evaluation Patient Details Name: Paul [name] MRN: [id number] DOB: Nov 10, 1955 Today's Date: 08/13/2020    History of Present Illness 65 yo M extensive PMH including ESRD s/p failed renal transplant - on THSa iHD, chronic immunosuppression, systolic heart failure, Afib on eliquis, duodenal cancer s/p whipple (2002), osteomyelitis and critical limb ischemia s/p Bilat BKAs, COVID, recent enterococcus bacteremia requiring hospitalization, who presented to ED 3/25 from [place] home with SOB. SOB has increased over the past week -- associated with missing HD x3 due to inability to access the patient's RUE AVF.  Pt hypotensive in ED with SBPs 70s-- received small IVF bolus and started on levo. CXR revealed large L pleural effusion.   Clinical Impression   Pt PTA: Prior to recent hospitalization (06/11/20-07/20/20), pt ambulated mod I with B prosthetic LEs and RW. Mod I ADLs. Pt has been at SNF since d/c from hospital. Pt currently, very fatigued easily with minimal re-positioning in bed. Pt participating minimally and requires assist for ADL and bed mobility. Pt maxA +2 for bed mobility rolling; maxA to totalA due to poor activity tolerance and decreased strength. Pt's BP: at rest: 95/75 on 3L O2 >89% O2, 105 BPM; after exertion: 91/69, 130 BPM  Erratic HR 115-150 Pt would benefit from continued OT skilled services. OT following acutely.    Follow Up Recommendations  Supervision/Assistance - 24 hour;SNF    Equipment Recommendations  None recommended by OT    Recommendations for Other Services       Precautions / Restrictions Precautions Precautions: Other (comment);Fall Precaution Comments: chest tube, temp L IJ HD cath Restrictions Weight Bearing Restrictions: No      Mobility Bed Mobility Overal bed mobility: Needs Assistance Bed Mobility: Rolling Rolling: +2 for physical assistance;Max assist         General bed mobility comments: Pt using BUEs to  push in bed in semi sitting to scoot self over to midline in bed    Transfers                 General transfer comment: deferred    Balance                                           ADL either performed or assessed with clinical judgement   ADL Overall ADL's : Needs assistance/impaired Eating/Feeding: Maximal assistance;Bed level Eating/Feeding Details (indicate cue type and reason): due to fatigue Grooming: Total assistance;Bed level   Upper Body Bathing: Total assistance;Bed level   Lower Body Bathing: Total assistance;Bed level   Upper Body Dressing : Total assistance;Bed level   Lower Body Dressing: Total assistance;Bed level   Toilet Transfer: Total assistance   Toileting- Clothing Manipulation and Hygiene: Total assistance;Bed level       Functional mobility during ADLs: Total assistance;+2 for physical assistance;+2 for safety/equipment General ADL Comments: Pt very fatigued easily with minimal re-positioning in bed and stating, "Why are we doing this? My wife should be back soon." pt unable to hold a cup at this time, but told OTR to "leave it close to me." Pt participating minimally and requires assist for ADL and bed mobility.     Vision Baseline Vision/History: No visual deficits Patient Visual Report: No change from baseline Vision Assessment?: No apparent visual deficits     Perception     Praxis      Pertinent Vitals/Pain Pain  Assessment: Faces Faces Pain Scale: Hurts a little bit Pain Location: generalized with mobility Pain Descriptors / Indicators: Grimacing Pain Intervention(s): Limited activity within patient's tolerance     Hand Dominance Right   Extremity/Trunk Assessment Upper Extremity Assessment Upper Extremity Assessment: Generalized weakness;RUE deficits/detail;LUE deficits/detail RUE Deficits / Details: 2/5 MM grade in BUEs, poor grip strength RUE Coordination: decreased fine motor;decreased gross  motor LUE Deficits / Details: 2/5 MM grade in BUEs, poor grip strength LUE Coordination: decreased fine motor;decreased gross motor   Lower Extremity Assessment Lower Extremity Assessment: Generalized weakness RLE Deficits / Details: h/o B BKAs LLE Deficits / Details: h/o B BKAs       Communication Communication Communication: No difficulties   Cognition Arousal/Alertness: Lethargic Behavior During Therapy: Flat affect Overall Cognitive Status: Within Functional Limits for tasks assessed                                 General Comments: Observably not interested in therapy; possibly withdrawn   General Comments  BP: at rest: 95/75 on 3L O2 >89% O2, 105 BPM; after exertion: 91/69, 130 BPM. HR: Erratic HR 115-150 BPM    Exercises     Shoulder Instructions      Home Living Family/patient expects to be discharged to:: Private residence Living Arrangements: Spouse/significant other Available Help at Discharge: Family;Available 24 hours/day Type of Home: House Home Access: Ramped entrance     Home Layout: Two level;Able to live on main level with bedroom/bathroom Alternate Level Stairs-Number of Steps: 1 step down into the living room Alternate Level Stairs-Rails: None Bathroom Shower/Tub: Occupational psychologist: Standard Bathroom Accessibility: Yes   Home Equipment: Cane - single point;Shower seat;Wheelchair - manual;Crutches;Walker - 2 wheels;Hand held shower head;Grab bars - toilet;Shower seat - built in   Additional Comments: was in OP PT; BLE prosthetics      Prior Functioning/Environment Level of Independence: Independent with assistive device(s)        Comments: Prior to recent hospitalization (06/11/20-07/20/20), pt ambulated mod I with B prosthetic LEs and RW. Mod I ADLs. Pt has been at SNF since d/c from hospital.        OT Problem List: Decreased range of motion;Decreased strength;Decreased activity tolerance;Impaired balance  (sitting and/or standing);Impaired vision/perception;Decreased safety awareness;Decreased coordination;Decreased cognition;Pain;Increased edema      OT Treatment/Interventions: Self-care/ADL training;Therapeutic exercise;Energy conservation;DME and/or AE instruction;Therapeutic activities;Patient/family education;Balance training    OT Goals(Current goals can be found in the care plan section) Acute Rehab OT Goals Patient Stated Goal: unsure OT Goal Formulation: With patient Time For Goal Achievement: 08/27/20 Potential to Achieve Goals: Fair ADL Goals Pt Will Perform Eating: with mod assist;sitting Pt Will Perform Grooming: with mod assist;sitting Pt/caregiver will Perform Home Exercise Program: Increased strength;Right Upper extremity;Left upper extremity;Both right and left upper extremity;With minimal assist Additional ADL Goal #1: Pt will increase to modA for rolling in bed as precursor to perform ADL functional mobility. Additional ADL Goal #2: Pt will sit EOB x2 mins to increase activity tolerance.  OT Frequency: Min 2X/week   Barriers to D/C:            Co-evaluation PT/OT/SLP Co-Evaluation/Treatment: Yes Reason for Co-Treatment: Complexity of the patient's impairments (multi-system involvement);For patient/therapist safety   OT goals addressed during session: ADL's and self-care      AM-PAC OT "6 Clicks" Daily Activity     Outcome Measure Help from another person eating meals?: Total Help  from another person taking care of personal grooming?: Total Help from another person toileting, which includes using toliet, bedpan, or urinal?: Total Help from another person bathing (including washing, rinsing, drying)?: Total Help from another person to put on and taking off regular upper body clothing?: Total Help from another person to put on and taking off regular lower body clothing?: Total 6 Click Score: 6   End of Session Equipment Utilized During Treatment: Oxygen Nurse  Communication: Mobility status  Activity Tolerance: Patient limited by pain;Patient limited by lethargy;Treatment limited secondary to medical complications (Comment) Patient left: in bed;with call bell/phone within reach;with bed alarm set  OT Visit Diagnosis: Unsteadiness on feet (R26.81);Muscle weakness (generalized) (M62.81);Pain Pain - part of body:  (generlized)                Time: 6606-3016 OT Time Calculation (min): 15 min Charges:  OT General Charges $OT Visit: 1 Visit OT Evaluation $OT Eval Moderate Complexity: 1 Mod  Jefferey Pica, OTR/L Acute Rehabilitation Services Pager: [phone] Office: [phone]   ALLISON C 08/13/2020, 1:57 PM

## 2020-08-14 ENCOUNTER — Inpatient Hospital Stay (HOSPITAL_COMMUNITY): Payer: Medicare Other

## 2020-08-14 DIAGNOSIS — I4891 Unspecified atrial fibrillation: Secondary | ICD-10-CM | POA: Diagnosis not present

## 2020-08-14 DIAGNOSIS — J90 Pleural effusion, not elsewhere classified: Secondary | ICD-10-CM | POA: Diagnosis not present

## 2020-08-14 DIAGNOSIS — J9601 Acute respiratory failure with hypoxia: Secondary | ICD-10-CM

## 2020-08-14 DIAGNOSIS — N186 End stage renal disease: Secondary | ICD-10-CM | POA: Diagnosis not present

## 2020-08-14 DIAGNOSIS — I5020 Unspecified systolic (congestive) heart failure: Secondary | ICD-10-CM | POA: Diagnosis not present

## 2020-08-14 LAB — CBC
HCT: 26.2 % — ABNORMAL LOW (ref 39.0–52.0)
Hemoglobin: 8.2 g/dL — ABNORMAL LOW (ref 13.0–17.0)
MCH: 30 pg (ref 26.0–34.0)
MCHC: 31.3 g/dL (ref 30.0–36.0)
MCV: 96 fL (ref 80.0–100.0)
Platelets: 157 10*3/uL (ref 150–400)
RBC: 2.73 MIL/uL — ABNORMAL LOW (ref 4.22–5.81)
RDW: 16.3 % — ABNORMAL HIGH (ref 11.5–15.5)
WBC: 8.4 10*3/uL (ref 4.0–10.5)
nRBC: 0 % (ref 0.0–0.2)

## 2020-08-14 LAB — GLUCOSE, CAPILLARY
Glucose-Capillary: 125 mg/dL — ABNORMAL HIGH (ref 70–99)
Glucose-Capillary: 114 mg/dL — ABNORMAL HIGH (ref 70–99)
Glucose-Capillary: 189 mg/dL — ABNORMAL HIGH (ref 70–99)
Glucose-Capillary: 212 mg/dL — ABNORMAL HIGH (ref 70–99)
Glucose-Capillary: 217 mg/dL — ABNORMAL HIGH (ref 70–99)
Glucose-Capillary: 247 mg/dL — ABNORMAL HIGH (ref 70–99)

## 2020-08-14 LAB — HEPARIN LEVEL (UNFRACTIONATED)
Heparin Unfractionated: 0.27 IU/mL — ABNORMAL LOW (ref 0.30–0.70)
Heparin Unfractionated: 0.18 IU/mL — ABNORMAL LOW (ref 0.30–0.70)

## 2020-08-14 LAB — BASIC METABOLIC PANEL
Anion gap: 8 (ref 5–15)
BUN: 48 mg/dL — ABNORMAL HIGH (ref 8–23)
CO2: 26 mmol/L (ref 22–32)
Calcium: 8.1 mg/dL — ABNORMAL LOW (ref 8.9–10.3)
Chloride: 101 mmol/L (ref 98–111)
Creatinine, Ser: 4.46 mg/dL — ABNORMAL HIGH (ref 0.61–1.24)
GFR, Estimated: 14 mL/min — ABNORMAL LOW (ref >60)
Glucose, Bld: 124 mg/dL — ABNORMAL HIGH (ref 70–99)
Potassium: 3.9 mmol/L (ref 3.5–5.1)
Sodium: 135 mmol/L (ref 135–145)

## 2020-08-14 LAB — APTT: aPTT: 42 seconds — ABNORMAL HIGH (ref 24–36)

## 2020-08-14 MED ORDER — STERILE WATER FOR INJECTION IJ SOLN
5.0000 mg | Freq: Every day | RESPIRATORY_TRACT | Status: AC
  Administered 2020-08-14: 5 mg via INTRAPLEURAL
  Filled 2020-08-14: qty 5

## 2020-08-14 MED ORDER — PANTOPRAZOLE SODIUM 40 MG PO TBEC
40.0000 mg | DELAYED_RELEASE_TABLET | Freq: Every day | ORAL | Status: DC
  Administered 2020-08-14 – 2020-08-16 (×3): 40 mg via ORAL
  Filled 2020-08-14 (×4): qty 1

## 2020-08-14 MED ORDER — SODIUM CHLORIDE (PF) 0.9 % IJ SOLN
10.0000 mg | Freq: Every day | INTRAMUSCULAR | Status: AC
  Administered 2020-08-14: 10 mg via INTRAPLEURAL
  Filled 2020-08-14: qty 10

## 2020-08-14 NOTE — Procedures (Signed)
Pleural Fibrinolytic Administration Procedure Note  Paul [name]  [id number]  07-21-1955  Date:08/14/20  Time:12:23 PM   Provider Performing:Paul [name] Paul [name]   Procedure: Pleural Fibrinolysis Initial day [phone])  Indication(s) Fibrinolysis of complicated pleural effusion  Consent Risks of the procedure as well as the alternatives and risks of each were explained to the patient and/or caregiver.  Consent for the procedure was obtained.   Anesthesia None   Time Out Verified patient identification, verified procedure, site/side was marked, verified correct patient position, special equipment/implants available, medications/allergies/relevant history reviewed, required imaging and test results available.   Sterile Technique Hand hygiene, gloves   Procedure Description Existing pleural catheter was cleaned and accessed in sterile manner.  10mg  of tPA in 30cc of saline and 5mg  of dornase in 30cc of sterile water were injected into pleural space using existing pleural catheter.  Catheter will be clamped for 1 hour and then placed back to suction.   Complications/Tolerance None; patient tolerated the procedure well.   EBL None   Specimen(s) None  Paul Hy, DO 08/14/20 12:23 PM [facility]

## 2020-08-14 NOTE — Progress Notes (Signed)
[place] for heparin Indication: atrial fibrillation  Allergies  Allergen Reactions  . Metformin And Related Other (See Comments)    Has kidney problems    Patient Measurements: Height: 6' (182.9 cm) (from previous records) Weight: 66.1 kg (145 lb 11.6 oz) IBW/kg (Calculated) : 77.6  Vital Signs: Temp: 96.9 F (36.1 C) (03/28 0330) Temp Source: Axillary (03/28 0330) BP: 130/71 (03/28 0600) Pulse Rate: 84 (03/28 0600)  Labs: Recent Labs    08/12/20 0259 08/12/20 1133 08/12/20 1841 08/13/20 0427 08/14/20 0432  HGB 7.8*  --   --  7.8* 8.2*  HCT 25.8*  --   --  26.0* 26.2*  PLT 184  --   --  146* 157  APTT  --  43* 101*  --   --   HEPARINUNFRC  --   --   --  0.67 0.27*  CREATININE 6.63*  --   --  3.65* 4.46*    Estimated Creatinine Clearance: 15.2 mL/min (A) (by C-G formula based on SCr of 4.46 mg/dL (H)).  Assessment: 65 yo m presenting with SOB. Patient is on apixaban prior to admission. Patient is ESRD on HD TTS with clotted access. Temp HD cath placed. Pharmacy consulted for heparin while apixaban on hold. Last dose 3/25 PM. Heparin level 0.27 on heparin 750 units/hr is subtherapeutic. Lab drawn appropriately. Hgb 8.2. Plt 157. No issues with IV infusion. No reported bleeding.    Goal of Therapy:  Heparin level 0.3-0.7 units/ml Monitor platelets by anticoagulation protocol: Yes   Plan:  Increase heparin to 800 units/hr Check 8 hr heparin level Monitor heparin level, CBC and s/s of bleeding daily Follow up transition back to apixaban   Cristela Felt, PharmD Clinical Pharmacist   08/14/2020 7:27 AM

## 2020-08-14 NOTE — Progress Notes (Signed)
[place] for heparin Indication: atrial fibrillation  Allergies  Allergen Reactions  . Metformin And Related Other (See Comments)    Has kidney problems    Patient Measurements: Height: 6' (182.9 cm) (from previous records) Weight: 66.1 kg (145 lb 11.6 oz) IBW/kg (Calculated) : 77.6  Vital Signs: Temp: 97.6 F (36.4 C) (03/28 1600) Temp Source: Oral (03/28 1600) BP: 134/80 (03/28 1700) Pulse Rate: 164 (03/28 1700)  Labs: Recent Labs    08/12/20 0259 08/12/20 1133 08/12/20 1841 08/13/20 0427 08/14/20 0432 08/14/20 1620  HGB 7.8*  --   --  7.8* 8.2*  --   HCT 25.8*  --   --  26.0* 26.2*  --   PLT 184  --   --  146* 157  --   APTT  --  43* 101*  --   --  42*  HEPARINUNFRC  --   --   --  0.67 0.27* 0.18*  CREATININE 6.63*  --   --  3.65* 4.46*  --     Estimated Creatinine Clearance: 15.2 mL/min (A) (by C-G formula based on SCr of 4.46 mg/dL (H)).  Assessment: 65 yo m presenting with SOB. Patient is on apixaban prior to admission. Patient is ESRD on HD TTS with clotted access. Temp HD cath placed. Pharmacy consulted for heparin while apixaban on hold. Last dose 3/25 PM.  Heparin level down to 0.18  on heparin 800 units/hr is subtherapeutic. Correlates with aPTT which is also low at 42.. Lab drawn appropriately. Hgb 8.2. Plt 157. No issues with IV infusion. No reported bleeding.    Goal of Therapy:  Heparin level 0.3-0.7 units/ml Monitor platelets by anticoagulation protocol: Yes   Plan:  Increase heparin to 950 units/hr Check 8 hr heparin level Monitor heparin level, CBC and s/s of bleeding daily Follow up transition back to apixaban   Sloan Leiter, PharmD, BCPS, BCCCP Clinical Pharmacist Please refer to [facility] for [place] numbers 08/14/2020 5:20 PM

## 2020-08-14 NOTE — Progress Notes (Signed)
[facility] Progress Note  Subjective: seen in ICU, resting with no complaints  Vitals:   08/14/20 0730 08/14/20 0733 08/14/20 0800 08/14/20 0830  BP: 140/68  (!) 147/88 (!) 147/76  Pulse: 81  (!) 124 (!) 120  Resp: 13  14 12   Temp:  97.7 F (36.5 C)    TempSrc:  Oral    SpO2: 100%  100% 100%  Weight:      Height:        Exam: GEN: chronically ill appearing, lying in bed in nad ENT: no nasal discharge, mmm EYES: no scleral icterus, eomi CV: tachycardia, irregular rhythm PULM: no iwob, bilateral chest rise ABD: NABS, non-distended SKIN: no rashes or jaundice EXT: no edema, warm and well perfused RUE AVF: no bruit   OP HD: [facility] TTS     3.5h  400/500   66kg  3K/2.5Ca bath  AVF  Hep none  - calcitriol 1 tiw  - venofer 100 x 5 , started 3/19  - mircera 100 q2 (last dose during prior admit)   Assessment/ Plan: 1. Hypotension - suspected septic shock. Recent  admission w Enterococcus bacteremia. Large left pleural effusion on CXR. SP chest tube placement w/ ^^drainage. NG on cultures at this time. Hypotension greatly improved. 2. AFib RVR --Usual meds Eliquis, metoprolol as tolerated w/ sepsis 3. ESRD -  HD TTS. Missed HD x 2 this d/t clotted access. S/p declot at [facility] 3/24 but now clotted again, possibly 2/2 to hypotension. Temp cath placed and had HD Sat 3/26 afternoon. Stable today. Continue w/ temp cath for now with current ongoing palliative care discussions. Next HD tomorrow if desired 4. Hypotension/volume  - shock resolved. Appears euvolemic 5. Anemia  - On ESA as outpatient.  Restart ESA with next HD here. Hold IV Fe secondary to sepsis w/u   6. Metabolic bone disease -  Continue Auryixa binders/VDRA when taking PO  7. T1DM - Insulin per primary      Paul [name]  08/13/2020, 9:49 AM   Recent Labs  Lab 08/12/20 0259 08/13/20 0427 08/14/20 0432  K 5.0 3.5 3.9  BUN 79* 34* 48*  CREATININE 6.63* 3.65* 4.46*  CALCIUM 8.0* 7.7* 8.1*  PHOS 7.0*   --   --   HGB 7.8* 7.8* 8.2*   Inpatient medications: . Chlorhexidine Gluconate Cloth  6 each Topical Daily  . Chlorhexidine Gluconate Cloth  6 each Topical Q0600  . feeding supplement (NEPRO CARB STEADY)  237 mL Oral TID BM  . insulin aspart  0-9 Units Subcutaneous Q4H  . lipase/protease/amylase  12,000 Units Oral TID AC  . metoprolol tartrate  25 mg Oral BID  . midodrine  10 mg Oral TID WC  . multivitamin  1 tablet Oral QHS  . pantoprazole (PROTONIX) IV  40 mg Intravenous QHS  . predniSONE  5 mg Oral Q breakfast  . sertraline  50 mg Oral QHS  . sodium chloride flush  10 mL Intracatheter Q8H  . sodium chloride flush  10-40 mL Intracatheter Q12H  . tacrolimus  2 mg Oral BID   . heparin 800 Units/hr (08/14/20 0800)  . norepinephrine (LEVOPHED) Adult infusion Stopped (08/13/20 0016)   docusate sodium, heparin, polyethylene glycol, sodium chloride flush

## 2020-08-14 NOTE — Progress Notes (Signed)
This chaplain consulted with PMT NP-Megan Mason. PMT recommends checking in with Pt. and family after Family Meeting today at 3pm.

## 2020-08-14 NOTE — Progress Notes (Addendum)
[facility] Progress Note  Subjective: seen in ICU, no new c/o's.   Vitals:   08/14/20 0730 08/14/20 0733 08/14/20 0800 08/14/20 0830  BP: 140/68  (!) 147/88 (!) 147/76  Pulse: 81  (!) 124 (!) 120  Resp: 13  14 12   Temp:  97.7 F (36.5 C)    TempSrc:  Oral    SpO2: 100%  100% 100%  Weight:      Height:        Exam:   alert, nad, tired  no jvd  Chest cta bilat  Cor reg no RG  Abd soft ntnd no ascites   Ext bilat BKA, no edema   NF, ox3    RUE AVF - no bruit    OP HD: [facility] TTS     3.5h  400/500   66kg  3K/2.5Ca bath  AVF  Hep none  - calcitriol 1 tiw  - venofer 100 x 5 , started 3/19  - mircera 100 q2 (last dose during prior admit)   Assessment/ Plan: 1. Hypotension - suspected septic shock. Recent  admission w Enterococcus bacteremia. Large left pleural effusion on CXR. SP chest tube placement w/ ^^drainage. Cultures pending.  IV antibiotics per PCCM.  2. AFib RVR --Usual meds Eliquis, metoprolol - Holding w sepsis w/u  3. ESRD -  HD TTS. Missed HD x 2 this week d/t clotted access. S/p declot at [facility] 3/24 but now clotted again, possibly 2/2 to hypotension. Temp cath placed and had HD Sat 3/26 afternoon. Stable today. Continue w/ temp cath for now with current ongoing palliative care discussions. Next HD Tuesday.  4. Hypotension/volume  - Shock resolved, off of pressors. No vol ^ or edema on exam, at dry wt. No UF w/ HD yesterday.  5. Anemia  - On ESA as outpatient.  Restart ESA with next HD here. Hold IV Fe secondary to sepsis w/u   6. Metabolic bone disease -  Continue Auryixa binders/VDRA when taking PO  7. T1DM - Insulin per primary      Rob Lashae Wollenberg 08/13/2020, 9:22 AM   Recent Labs  Lab 08/12/20 0259 08/13/20 0427 08/14/20 0432  K 5.0 3.5 3.9  BUN 79* 34* 48*  CREATININE 6.63* 3.65* 4.46*  CALCIUM 8.0* 7.7* 8.1*  PHOS 7.0*  --   --   HGB 7.8* 7.8* 8.2*   Inpatient medications: . Chlorhexidine Gluconate Cloth  6 each Topical Daily  .  Chlorhexidine Gluconate Cloth  6 each Topical Q0600  . feeding supplement (NEPRO CARB STEADY)  237 mL Oral TID BM  . insulin aspart  0-9 Units Subcutaneous Q4H  . lipase/protease/amylase  12,000 Units Oral TID AC  . metoprolol tartrate  25 mg Oral BID  . midodrine  10 mg Oral TID WC  . multivitamin  1 tablet Oral QHS  . pantoprazole (PROTONIX) IV  40 mg Intravenous QHS  . predniSONE  5 mg Oral Q breakfast  . sertraline  50 mg Oral QHS  . sodium chloride flush  10 mL Intracatheter Q8H  . sodium chloride flush  10-40 mL Intracatheter Q12H  . tacrolimus  2 mg Oral BID   . heparin 800 Units/hr (08/14/20 0800)  . norepinephrine (LEVOPHED) Adult infusion Stopped (08/13/20 0016)   docusate sodium, heparin, polyethylene glycol, sodium chloride flush

## 2020-08-14 NOTE — Progress Notes (Signed)
Daily Progress Note   Patient Name: Paul [name]       Date: 08/14/2020 DOB: 06-Jan-1956  Age: 65 y.o. MRN#: [id number] Attending Physician: Paul Hy, DO Primary Care Physician: Paul Amel, MD Admit Date: 08/10/2020  Reason for Consultation/Follow-up: Establishing goals of care  Subjective: Patient awake, alert, oriented and able to participate in discussion.  GOC:  Family meeting with patient, wife Paul [name]), son Paul [name]), and Dr. Coy [name].   Introduced role of palliative medicine and support this team provides.  Briefly discussed life review. Kidney failed and required re-initiation of hemodialysis in September 2021. In the last few weeks since previous hospitalization and rehab, patient has been functionally declining.  Discussed in detail events leading up to admission and course of hospitalization including diagnoses, interventions, plan of care. Discussed continued aggressive medical pathway versus comfort focused pathway. Explored patient's thoughts and feelings regarding his condition. He does not wish to be a burden on his wife. She shares he is not a burden but that it was becoming more challenging to care for him at home. He understands he is not a candidate for another kidney transplant. He understands dialysis is long-term. He shares with family, Dr. Coy [name] and I that "I don't want to just give up." He wishes to continue hemodialysis and PT/OT efforts with hopes to be able to ambulate again with prosthetics, or at least with a WC or motorized scooter. Paul [name] understands he will need SNF rehab on discharge. Therapeutic listening. Support provided. Discussed quality of life and ongoing discussions regarding his goals and wishes.   Discussed next steps--vascular  recommendations, ongoing PT/OT, importance of nutrition and continuing nutritional supplements.  Introduced and discussed MOST form. Patient and family are ready to complete today. Decisions include: DNR/DNI, continue limited additional interventions including medical management, CPAP/BiPAP if indicated, IVF/ABX if indicated, and feeding tube for time trial. Durable DNR completed. Electronic Vynca MOST form completed. Copies made for chart and family.   Answered questions. [place] [facility] and PMT contact information given.    Length of Stay: 3  Current Medications: Scheduled Meds:  . alteplase (TPA) for intrapleural administration  10 mg Intrapleural Daily   And  . pulmozyme (DORNASE) for intrapleural administration  5 mg Intrapleural Daily  . Chlorhexidine Gluconate Cloth  6 each Topical Daily  . Chlorhexidine Gluconate Cloth  6 each Topical Q0600  . feeding supplement (NEPRO CARB STEADY)  237 mL Oral TID BM  . insulin aspart  0-9 Units Subcutaneous Q4H  . lipase/protease/amylase  12,000 Units Oral TID AC  . metoprolol tartrate  25 mg Oral BID  . midodrine  10 mg Oral TID WC  . multivitamin  1 tablet Oral QHS  . pantoprazole (PROTONIX) IV  40 mg Intravenous QHS  . predniSONE  5 mg Oral Q breakfast  . sertraline  50 mg Oral QHS  . sodium chloride flush  10 mL Intracatheter Q8H  . sodium chloride flush  10-40 mL Intracatheter Q12H  . tacrolimus  2 mg Oral BID    Continuous Infusions: . heparin 800 Units/hr (08/14/20 0800)  . norepinephrine (LEVOPHED) Adult infusion Stopped (08/13/20 0016)    PRN Meds: docusate sodium, heparin, polyethylene glycol, sodium chloride flush  Physical Exam Vitals and nursing note reviewed.  Constitutional:      General: He is awake.  HENT:     Head: Normocephalic and atraumatic.  Pulmonary:     Effort: No tachypnea, accessory muscle usage or respiratory distress.  Skin:    General: Skin is warm and dry.  Neurological:     Mental Status: He is  alert and oriented to person, place, and time.  Psychiatric:        Mood and Affect: Mood normal.        Speech: Speech normal.        Cognition and Memory: Cognition normal.            Vital Signs: BP (!) 147/76   Pulse (!) 120   Temp (!) 97.4 F (36.3 C) (Oral)   Resp 12   Ht 6' (1.829 m) Comment: from previous records  Wt 66.1 kg   SpO2 100%   BMI 19.76 kg/m  SpO2: SpO2: 100 % O2 Device: O2 Device: Nasal Cannula O2 Flow Rate: O2 Flow Rate (L/min): 3 L/min  Intake/output summary:   Intake/Output Summary (Last 24 hours) at 08/14/2020 1151 Last data filed at 08/14/2020 0800 Gross per 24 hour  Intake 245.06 ml  Output 100 ml  Net 145.06 ml   LBM: Last BM Date: 08/14/20 Baseline Weight: Weight: (S) 79.4 kg (66kg dry weight without prosthetics- prosthetics weigh 5.8kg) Most recent weight: Weight: 66.1 kg       Palliative Assessment/Data: PPS 30%      Patient Active Problem List   Diagnosis Date Noted  . Immunosuppression due to drug therapy ([place]) 08/15/2020  . History of Whipple procedure 07/28/2020  . Heart failure with reduced ejection fraction ([place]) 07/24/2020  . PAF (paroxysmal atrial fibrillation) ([place]) 06/11/2020  . Chronic combined systolic and diastolic CHF (congestive heart failure) ([place]) 06/11/2020  . Labile blood glucose   . Diabetic peripheral neuropathy ([place])   . Anemia of chronic disease   . Right below-knee amputee ([place]) 01/18/2020  . Amputation stump infection ([place])   . Subacute osteomyelitis of right tibia ([place])   . Dehiscence of amputation stump ([place])   . Sepsis ([place]) 01/04/2020  . Cellulitis 12/31/2019  . CKD stage 4 due to type 2 diabetes mellitus ([place]) 04/29/2019  . AKI (acute kidney injury) ([place]) 04/24/2019  . Hyperglycemia 04/24/2019  . Nausea and vomiting 04/24/2019  . Fever 04/23/2019  . Acute metabolic encephalopathy 80/99/8338  . Stroke ([place]) 10/28/2018  . Middle cerebral artery embolism, left 10/28/2018  . Metabolic acidosis  25/09/3974  . Dehydration 05/24/2018  . Stage II pressure ulcer ([place])  05/24/2018  . Dyspnea 05/23/2018  . Acute respiratory failure with hypoxia ([place]) 05/23/2018  . Abnormal cardiovascular stress test 12/29/2013  . Awaiting organ transplant 12/09/2013  . Leg ulcer ([place]) 11/17/2013  . Chronic kidney disease (CKD), stage V ([place]) 11/17/2013  . Diabetes ([place]) 11/17/2013  . Cancer of duodenum ([place]) 11/17/2013  . Dyslipidemia 11/17/2013  . Chronic kidney disease requiring chronic dialysis ([place]) 11/17/2013  . Hypercholesteremia 11/17/2013  . H/O malignant neoplasm 11/17/2013  . H/O: HTN (hypertension) 11/17/2013  . BP (high blood pressure) 11/17/2013  . Hypotension 11/17/2013  . Angiopathy, peripheral ([place]) 11/17/2013  . AF (paroxysmal atrial fibrillation) ([place]) 11/17/2013  . BKA stump complication ([place]) 96/22/2979  . S/P bilateral BKA (below knee amputation) ([place]) 08/03/2013  . S/P BKA (below knee amputation) bilateral ([place]) 07/30/2013  . Open wound of both legs with complication 89/21/1941  . Acute blood loss anemia 07/12/2013  . Syncope 07/09/2013  . Intolerance to CAPD peritoneal dialysis s/p CAPD cath removal 07/09/2013 07/05/2013  . Critical lower limb ischemia ([place]) 06/30/2013  . Extremity pain 06/30/2013  . Atherosclerosis of native arteries of the extremities with ulceration(440.23) 06/17/2013  . Gait disorder 05/31/2013  . Weakness 05/24/2013  . Depression 04/03/2013  . GERD (gastroesophageal reflux disease) 04/03/2013  . Atrial fibrillation ([place]) 04/02/2013  . DM (diabetes mellitus) ([place]) 12/28/2012  . Other complications due to renal dialysis device, implant, and graft 05/19/2012  . Diabetes mellitus, type 2 ([place]) 11/13/2011  . Anemia 07/02/2011  . ESRD on hemodialysis [facility]) 05/29/2011    Palliative Care Assessment & Plan   Patient Profile: Per intake H&P --> 72 yo M extensive PMH including ESRD s/p failed renal transplant - on THSa iHD, chronic immunosuppression,  systolic heart failure, Afib on eliquis, duodenal cancer s/p whipple (2002), osteomyelitis and critical limb ischemia s/p Bilat BKAs, COVID, recent enterococcus bacteremia requiring hospitalization, who presented to ED 3/25 from [place] home with SOB. SOB has increased over the past week -- associated with missing HD x3 due to inability to access the patient's RUE AVF.  Pt hypotensive in ED with SBPs 70s-- received small IVF bolus and started on levo. CXR revealed large L pleural effusion. CMP obtained which did not reveal any absolute indications for emergent HD. CBC with WBC 6.5, hgb 7.9, plt 168. LA 2.2"   CT placed for pleural effusion, awaiting cytology results.  Malnutrition, DM2  Assessment: Hx of renal transplant, failed, remains on immunosuppression ESRD on hemodialysis Septic shock vs. Acute on chronic renal vasoplegia Left pleural effusion Afib RVR Severe protein calorie malnutrition Deconditioning Adult failure to thrive Hx of duodenal cancer s/p whipple Hx of CHF Hx of DM type 2  Recommendations/Plan:  GOC discussion with patient, wife, son, and Dr. Coy [name]. Patient/family confirm decision for DNR/DNI. Patient is not ready to give up. He wishes to continue hemodialysis and PT/OT attempts. He is hopeful to be able to walk again with prosthetics and is willing to return to rehab. Encouraged ongoing discussions with his providers and family regarding his quality of life and goals/wishes.  Copy of living will and HCPOA paperwork placed in chart.  MOST form completed. Decisions include: DNR/DNI, limited additional interventions with ongoing medical management, CPAP/BiPAP if indicated, IVF/ABX if indicated, feeding tube for time trial. Electronic Vynca MOST completed. Durable DNR completed.  PT/OT/SLP. Patient requesting different rehab facility on discharge. TOC notified.  Recommend outpatient palliative follow-up for ongoing support and discussions.   Code  Status: DNR   Code Status Orders  (  From admission, onward)         Start     Ordered   08/12/20 1347  Do not attempt resuscitation (DNR)  Continuous       Question Answer Comment  In the event of cardiac or respiratory ARREST Do not call a "code blue"   In the event of cardiac or respiratory ARREST Do not perform Intubation, CPR, defibrillation or ACLS   In the event of cardiac or respiratory ARREST Use medication by any route, position, wound care, and other measures to relive pain and suffering. May use oxygen, suction and manual treatment of airway obstruction as needed for comfort.      08/12/20 1346        Code Status History    Date Active Date Inactive Code Status Order ID Comments User Context   07/29/2020 1513 08/12/2020 1346 Full Code [id number]  Garner Nash, DO ED   06/11/2020 1719 07/21/2020 0053 Full Code [id number]  Neena Rhymes, MD ED   01/18/2020 2139 01/31/2020 1755 Full Code [id number]  Cathlyn Parsons, PA-C Inpatient   01/18/2020 2139 01/18/2020 2139 Full Code [id number]  Cathlyn Parsons, PA-C Inpatient   12/31/2019 1315 01/18/2020 2108 Full Code [id number]  Lequita Halt, MD ED   04/24/2019 0158 04/29/2019 1713 Full Code [id number]  Shela Leff, MD ED   10/28/2018 1748 11/04/2018 2111 Full Code [id number]  Luanne Bras, MD Inpatient   10/28/2018 1422 10/28/2018 1748 Full Code [id number]  Marliss Coots, PA-C ED   05/24/2018 0109 05/25/2018 1832 Full Code [id number]  Toy Baker, MD Inpatient   09/03/2013 1528 09/04/2013 1350 Full Code [id number]  Newt Minion, MD Inpatient   08/03/2013 1710 08/14/2013 1700 Full Code [id number]  Cathlyn Parsons, PA-C Inpatient   07/30/2013 1008 08/03/2013 1710 Full Code [id number]  Newt Minion, MD Inpatient   07/09/2013 1927 07/13/2013 2144 Full Code [id number]  Etta Quill, DO ED   05/31/2013 1602 06/11/2013 1703 Full Code [id number]  Cathlyn Parsons, PA-C Inpatient   05/24/2013 2116 05/31/2013 1602 Full Code [id number]   Theodis Blaze, MD ED   04/02/2013 1538 04/04/2013 1724 Full Code [id number]  Elmarie Shiley, MD Inpatient   Advance Care Planning Activity    Advance Directive Documentation   Flowsheet Row Most Recent Value  Type of Advance Directive Healthcare Power of [place]- happens to be out of town this weekend]  Pre-existing out of facility DNR order (yellow form or pink MOST form) -  "MOST" Form in Place? -       Prognosis:  Poor long-term  Discharge Planning:  [facility] for rehab with Palliative care service follow-up  Care plan was discussed with RN, patient, family at bedside (wife/son), Dr. Coy [name]  Thank you for allowing the Palliative Medicine Team to assist in the care of this patient.   Total Time 80 Prolonged Time Billed yes    Greater than 50% of this time was spent counseling and coordinating care related to the above assessment and plan.   Ihor Dow, DNP, FNP-C Palliative Medicine Team  Phone: [phone] Fax: [phone]  Please contact Palliative Medicine Team phone at [phone] for questions and concerns.

## 2020-08-14 NOTE — Progress Notes (Signed)
NAME:  THERESA DOHRMAN, MRN:  [id number], DOB:  1955/10/05, LOS: 3 ADMISSION DATE:  08/10/2020, CONSULTATION DATE:  07/26/2020 REFERRING MD:  Ron Parker- EM, CHIEF COMPLAINT:  SOB  History of Present Illness:  65 yo M extensive PMH including ESRD s/p failed renal transplant - on THSa iHD, chronic immunosuppression, systolic heart failure, Afib on eliquis, duodenal cancer s/p whipple (2002), osteomyelitis and critical limb ischemia s/p Bilat BKAs, COVID, recent enterococcus bacteremia requiring hospitalization, who presented to ED 3/25 from [facility] home with SOB. SOB has increased over the past week -- associated with missing HD x3 due to inability to access the patient's RUE AVF.  Pt hypotensive in ED with SBPs 70s-- received small IVF bolus and started on levo. CXR revealed large L pleural effusion. CMP obtained which did not reveal any absolute indications for emergent HD. CBC with WBC 6.5, hgb 7.9, plt 168. LA 2.2   Critical care consulted for admission  Pertinent  Medical History  Afib on eliquis Duodenal cancer s/p whipple ESRD Failed renal transplant Chronic immunosuppression  DM Gangrene Osteomyelitis Critical limb ischemia calciphylaxis S/p bilateral BKAs  HTN  COVID Enterococcus bacteremia  Combined systolic and diastolic heart failure, EF 25-30% Pleural effusion  IDDM  Significant Hospital Events: Including procedures, antibiotic start and stop dates in addition to other pertinent events   . 3/25 presented to ED from nursing home with SOB after multiple missed HD runs due to inability to access fistula. Hypotensive in ED, started on pressors. CXR with large L pleural effusion. Blood cx sent. Started on empiric abx for possible sepsis. Admitting to ICU, Planning for temp HD cath placement and likely pigtail placement  . 3/26: HD . 3/28 CXR >> No improvement in pleural effusion . 3/28: tPA and dornase  Interim History / Subjective:  No acute events  overnight Denies any pain, fever or chills Planned family meeting today.   Objective   Blood pressure 130/71, pulse 84, temperature (!) 96.9 F (36.1 C), temperature source Axillary, resp. rate 12, height 6' (1.829 m), weight 66.1 kg, SpO2 100 %.        Intake/Output Summary (Last 24 hours) at 08/14/2020 0730 Last data filed at 08/14/2020 0600 Gross per 24 hour  Intake 379.95 ml  Output 100 ml  Net 279.95 ml   Filed Weights   08/12/20 0426 08/13/20 0500 08/14/20 0322  Weight: 65 kg 65.6 kg 66.1 kg    Examination: Constitutional: chronically ill appearing man in NAD  HEENT: [place]/AT. PERRLA. EOMI. Temporal wasting Cardiovascular: Tachycardic. Irregular rhythm. No m/r/g. Respiratory: Diminished on L, chest tube on suction w/ no significant output Gastrointestinal: Soft. NT/ND. Hypoactive BS. Extremities: Bilateral BKA.  Skin: Warm and dry. No obvious rash Neurologic: Alert & Oriented x3. Moves all extremities.   Labs/imaging that I havepersonally reviewed  (right click and "Reselect all SmartList Selections" daily)   WC 8.4, hgb 8.4, kidney function slight worse from yesterday. No electrolyte abnormalities. Repeat CXR w/ moderate L pleural effusion, no significant improvement from prior.  Resolved Hospital Problem list    Assessment & Plan:   Septic shock, resolved Renal vasoplegia Initially though to be due to an infectious etiology so started on empirical abx with Lucianne Lei and Cefepime. Abx were discontinued yesterday due to lack of evidence for an infection. BP significantly improved with sBP in the 110s-140s. Currently off pressors and maintaining MAPS above 70.  P: --Continue midodrine with hold parameters --Bcx, body fluid cultures no growth in 3 days --Daily  vitals  Left pleural effusion Found on admission now s/p chest tube placement. Pleural fluid with lymphocyte predominant transudate. Repeat CXR shows moderate left pleural effusion w/ possible nondependent gas.  Chest tube with minimal drainage. O2 sats above 90% on 3 L [place].  P: --Continue chest tube at 20 cm suction --tPA 10 mg in 30cc of saline and 5mg  of dornase x1 dose --Chest tube care  ESRD on TTS HD s/p failed renal transplant on immunosuppressants.  Poor AVF function Patient missed HD x2 due to clotted access. Access was declotted on 3/24 but clotted again 2/2 hypotension. Received temp HD cath and dialysis on 3/26. Currently euvolemic with slight worsening of kidney function but no electrolyte abnormalities.  P: --Due for HD tomorrow --Continue prednisone 5 mg daily --Continue Prograf 2 mg BID --Plan for family meeting to discuss plan for long term HD --Appreciate palliative care assistance --Trend BMP, electrolytes --Strict I&O, daily weights --Avoid nephrotoxic agents  Afib w/ RVR CHADS2VAS of 3, 4.6% risk of emboli. On Toprol XL and long-term anticoagulation with Eliquis. Patient continues to be in afib with HR in the 120s.  --Continue heparin gtt --Continue metoprolol 25 mg BID --Tele  CHF ECHO on 08/12/20 showed EF 60-65%, mild LVH, normal LVSF recovered from EF of 25-30% on 06/13/20. Patient euvolemic on exam.  --Continue metoprolol --Monitor fluid status --Monitor electrolytes  Severe protein calorie malnutrition Failure to Thrive Deconditioning Patient has had poor PO intake. BMI is currently at 19.76.  --Family does not want cortrak --Encourage PO intake --Multivitamin --Feeding supplement  T2DM Stable. On chronic steroids. CBG range from 120s-180s in last 24 hours.  --SSI, sensitive --CBG monitoring  Depression --Continue Sertraline 50 mg daily  Hx duodenal adeno s/p whipple in 2002  Best practice (right click and "Reselect all SmartList Selections" daily)  Diet:  Oral Pain/Anxiety/Delirium protocol (if indicated): No VAP protocol (if indicated): Not indicated DVT prophylaxis: Systemic AC GI prophylaxis: PPI Glucose control:  SSI Yes Central venous  access:  Yes, and it is still needed Arterial line:  N/A Foley:  N/A Mobility:  OOB  PT consulted: Yes Last date of multidisciplinary goals of care discussion: today with palliative Code Status:  full code Disposition: Progressive

## 2020-08-15 ENCOUNTER — Inpatient Hospital Stay (HOSPITAL_COMMUNITY): Payer: Medicare Other

## 2020-08-15 DIAGNOSIS — F32A Depression, unspecified: Secondary | ICD-10-CM

## 2020-08-15 DIAGNOSIS — Z794 Long term (current) use of insulin: Secondary | ICD-10-CM

## 2020-08-15 DIAGNOSIS — I480 Paroxysmal atrial fibrillation: Secondary | ICD-10-CM | POA: Diagnosis not present

## 2020-08-15 DIAGNOSIS — E1159 Type 2 diabetes mellitus with other circulatory complications: Secondary | ICD-10-CM | POA: Diagnosis not present

## 2020-08-15 DIAGNOSIS — J90 Pleural effusion, not elsewhere classified: Secondary | ICD-10-CM | POA: Diagnosis not present

## 2020-08-15 DIAGNOSIS — I9589 Other hypotension: Secondary | ICD-10-CM | POA: Diagnosis not present

## 2020-08-15 LAB — BASIC METABOLIC PANEL
Anion gap: 9 (ref 5–15)
BUN: 65 mg/dL — ABNORMAL HIGH (ref 8–23)
CO2: 26 mmol/L (ref 22–32)
Calcium: 8.1 mg/dL — ABNORMAL LOW (ref 8.9–10.3)
Chloride: 100 mmol/L (ref 98–111)
Creatinine, Ser: 4.99 mg/dL — ABNORMAL HIGH (ref 0.61–1.24)
GFR, Estimated: 12 mL/min — ABNORMAL LOW (ref >60)
Glucose, Bld: 198 mg/dL — ABNORMAL HIGH (ref 70–99)
Potassium: 3.9 mmol/L (ref 3.5–5.1)
Sodium: 135 mmol/L (ref 135–145)

## 2020-08-15 LAB — CYTOLOGY - NON PAP

## 2020-08-15 LAB — GLUCOSE, CAPILLARY
Glucose-Capillary: 178 mg/dL — ABNORMAL HIGH (ref 70–99)
Glucose-Capillary: 147 mg/dL — ABNORMAL HIGH (ref 70–99)
Glucose-Capillary: 149 mg/dL — ABNORMAL HIGH (ref 70–99)
Glucose-Capillary: 168 mg/dL — ABNORMAL HIGH (ref 70–99)
Glucose-Capillary: 193 mg/dL — ABNORMAL HIGH (ref 70–99)
Glucose-Capillary: 257 mg/dL — ABNORMAL HIGH (ref 70–99)

## 2020-08-15 LAB — CBC
HCT: 26.6 % — ABNORMAL LOW (ref 39.0–52.0)
Hemoglobin: 8.3 g/dL — ABNORMAL LOW (ref 13.0–17.0)
MCH: 30.2 pg (ref 26.0–34.0)
MCHC: 31.2 g/dL (ref 30.0–36.0)
MCV: 96.7 fL (ref 80.0–100.0)
Platelets: 182 10*3/uL (ref 150–400)
RBC: 2.75 MIL/uL — ABNORMAL LOW (ref 4.22–5.81)
RDW: 16.3 % — ABNORMAL HIGH (ref 11.5–15.5)
WBC: 8.9 10*3/uL (ref 4.0–10.5)
nRBC: 0 % (ref 0.0–0.2)

## 2020-08-15 LAB — BODY FLUID CULTURE W GRAM STAIN: Culture: NO GROWTH

## 2020-08-15 LAB — HEPARIN LEVEL (UNFRACTIONATED)
Heparin Unfractionated: 0.11 IU/mL — ABNORMAL LOW (ref 0.30–0.70)
Heparin Unfractionated: <0.1 IU/mL — ABNORMAL LOW (ref 0.30–0.70)

## 2020-08-15 MED ORDER — HEPARIN SODIUM (PORCINE) 1000 UNIT/ML IJ SOLN
INTRAMUSCULAR | Status: AC
  Filled 2020-08-15: qty 4

## 2020-08-15 MED ORDER — SODIUM CHLORIDE (PF) 0.9 % IJ SOLN
10.0000 mg | Freq: Once | INTRAMUSCULAR | Status: AC
  Administered 2020-08-15: 10 mg via INTRAPLEURAL
  Filled 2020-08-15: qty 10

## 2020-08-15 MED ORDER — METOPROLOL TARTRATE 5 MG/5ML IV SOLN
5.0000 mg | INTRAVENOUS | Status: DC | PRN
  Administered 2020-08-16: 2.5 mg via INTRAVENOUS
  Filled 2020-08-15: qty 5

## 2020-08-15 MED ORDER — STERILE WATER FOR INJECTION IJ SOLN
5.0000 mg | Freq: Once | RESPIRATORY_TRACT | Status: AC
  Administered 2020-08-15: 5 mg via INTRAPLEURAL
  Filled 2020-08-15: qty 5

## 2020-08-15 NOTE — Progress Notes (Signed)
NAME:  Paul [name], MRN:  [id number], DOB:  08/10/1955, LOS: 4 ADMISSION DATE:  07/31/2020, CONSULTATION DATE:  08/08/2020 REFERRING MD:  Ron Parker- EM, CHIEF COMPLAINT:  SOB  History of Present Illness:  65 yo M extensive PMH including ESRD s/p failed renal transplant - on THSa iHD, chronic immunosuppression, systolic heart failure, Afib on eliquis, duodenal cancer s/p whipple (2002), osteomyelitis and critical limb ischemia s/p Bilat BKAs, COVID, recent enterococcus bacteremia requiring hospitalization, who presented to ED 3/25 from [facility] home with SOB. SOB has increased over the past week -- associated with missing HD x3 due to inability to access the patient's RUE AVF.  Pt hypotensive in ED with SBPs 70s-- received small IVF bolus and started on levo. CXR revealed large L pleural effusion. CMP obtained which did not reveal any absolute indications for emergent HD. CBC with WBC 6.5, hgb 7.9, plt 168. LA 2.2   Critical care consulted for admission  Pertinent  Medical History  Afib on eliquis Duodenal cancer s/p whipple ESRD Failed renal transplant Chronic immunosuppression  DM Gangrene Osteomyelitis Critical limb ischemia calciphylaxis S/p bilateral BKAs  HTN  COVID Enterococcus bacteremia  Combined systolic and diastolic heart failure, EF 25-30% Pleural effusion  IDDM  Significant Hospital Events: Including procedures, antibiotic start and stop dates in addition to other pertinent events   . 3/25 presented to ED from nursing home with SOB after multiple missed HD runs due to inability to access fistula. Hypotensive in ED, started on pressors. CXR with large L pleural effusion. Blood cx sent. Started on empiric abx for possible sepsis. Admitting to ICU, Planning for temp HD cath placement and likely pigtail placement  . 3/26: HD . 3/28 CXR >> No improvement in pleural effusion . 3/28: tPA and dornase  Interim History / Subjective:  Breathing feels a little better  today compared to yesterday.  He had a significant response to intrapleural lytics.  Objective   Blood pressure 128/86, pulse 92, temperature (!) 96.4 F (35.8 C), temperature source Axillary, resp. rate 14, height 6' (1.829 m), weight 68.4 kg, SpO2 91 %.        Intake/Output Summary (Last 24 hours) at 08/15/2020 1201 Last data filed at 08/15/2020 0600 Gross per 24 hour  Intake 191.3 ml  Output 930 ml  Net -738.7 ml   Filed Weights   08/13/20 0500 08/14/20 0322 08/15/20 0500  Weight: 65.6 kg 66.1 kg 68.4 kg    Examination: Constitutional: Chronically ill-appearing elderly man lying in bed no acute distress, sleeping but easily arousable HEENT: [place]/AT, eyes anicteric Neck: Dialysis catheter without erythema Cardiovascular: Regular rate, irregular rhythm Respiratory: Reduced left-sided breath sounds, serosanguineous pleural fluid from left-sided chest tube.  No erythema around chest tube site.  No airleak, not titling.  Breathing comfortably on nasal cannula. Gastrointestinal: Soft, nontender, nondistended Extremities: Bilateral BKAs.  No edema. Skin: Warm, dry, no rashes. Neurologic: Easily arousable, speaking full sentences, answering questions appropriately, moving all extremities.   Labs/imaging that I havepersonally reviewed  (right click and "Reselect all SmartList Selections" daily)  Blood, pleural fluid cultures all no growth to date  CXR> slightly smaller effusion, chest tube remains in appropriate position  Resolved Hospital Problem list    Assessment & Plan:   Undifferentiated shock, resolved Renal vasoplegia Initially though to be due to an infectious etiology so started on empirical abx with Lucianne Lei and Cefepime. Abx were discontinued 3/27 due to lack of source of infection. BP significantly improved with SBP in the  110s-140s. Currently off pressors and maintaining MAPS above 70.  P: --Continue midodrine with hold parameters --Continue to follow blood cultures until  finalized; monitor off antibiotics. --Daily vitals  Left loculated pleural effusion Found on admission now s/p chest tube placement. Pleural fluid with lymphocyte predominant transudate. Repeat CXR shows moderate left pleural effusion w/ possible nondependent gas. Chest tube with minimal drainage. O2 sats above 90% on 3 L [place].  P: --Continue chest tube at -20 cm suction --TPA and dornase --Chest tube care -CXR tomorrow morning  ESRD on TTS HD s/p failed renal transplant on immunosuppressants.  Poor AVF function Patient missed HD x2 due to clotted access. Access was declotted on 3/24 but clotted again 2/2 hypotension. Received temp HD cath and dialysis on 3/26. Currently euvolemic with slight worsening of kidney function but no electrolyte abnormalities.  P: --HD today; trial with fistula today. If able to use it, can remove HD catheter later. --Continue chronic immunosuppression> prednisone 5 mg daily, Prograf 2 mg BID --Patient still wants long-term HD --Strict I&O, daily weights --renal diet  HFpEF, chronic ECHO on 08/12/20 showed EF 60-65%, mild LVH, normal LVSF recovered from EF of 25-30% on 06/13/20. Patient euvolemic on exam.  --Continue metoprolol --Monitor fluid status --Monitor electrolytes  Severe protein calorie malnutrition Failure to Thrive Deconditioning Patient has had poor PO intake. BMI is currently at 19.76.  --Family does not want cortrak --Encourage PO intake --Multivitamin --Feeding supplement   Best practice (right click and "Reselect all SmartList Selections" daily)  Per primary  Julian Hy, DO 08/15/20 12:18 PM [facility]  For contact information, see Amion. If no response to pager, please call PCCM consult pager. After hours, 7PM- 7AM, please call El[facility].

## 2020-08-15 NOTE — Procedures (Signed)
Pleural Fibrinolytic Administration Procedure Note  CEJAY CAMBRE  [id number]  [birth year]  Date:08/15/20  Time:11:58 AM   Provider Performing:Shuntel Fishburn P Carlis Abbott   Procedure: Pleural Fibrinolysis Subsequent day [phone])  Indication(s) Fibrinolysis of complicated pleural effusion  Consent Risks of the procedure as well as the alternatives and risks of each were explained to the patient and/or caregiver.  Consent for the procedure was obtained.   Anesthesia None   Time Out Verified patient identification, verified procedure, site/side was marked, verified correct patient position, special equipment/implants available, medications/allergies/relevant history reviewed, required imaging and test results available.   Sterile Technique Hand hygiene, gloves   Procedure Description Existing pleural catheter was cleaned and accessed in sterile manner.  10mg  of tPA in 30cc of saline and 5mg  of dornase in 30cc of sterile water were injected into pleural space using existing pleural catheter.  Catheter will be clamped for 1 hour and then placed back to suction.   Complications/Tolerance None; patient tolerated the procedure well.   EBL None   Specimen(s) None  Julian Hy, DO 08/15/20 11:59 AM [facility]

## 2020-08-15 NOTE — Progress Notes (Incomplete)
[place] for heparin Indication: atrial fibrillation  Allergies  Allergen Reactions  . Metformin And Related Other (See Comments)    Has kidney problems    Patient Measurements: Height: 6' (182.9 cm) (from previous records) Weight: 65.4 kg (144 lb 2.9 oz) IBW/kg (Calculated) : 77.6  Vital Signs: Temp: 97 F (36.1 C) (03/29 1300) Temp Source: Axillary (03/29 1300) BP: 138/43 (03/29 1500) Pulse Rate: 92 (03/29 0700)  Labs: Recent Labs    08/12/20 1841 08/13/20 0427 08/13/20 0427 08/14/20 0432 08/14/20 1620 08/15/20 0244  HGB  --  7.8*   < > 8.2*  --  8.3*  HCT  --  26.0*  --  26.2*  --  26.6*  PLT  --  146*  --  157  --  182  APTT 101*  --   --   --  42*  --   HEPARINUNFRC  --  0.67   < > 0.27* 0.18* 0.11*  CREATININE  --  3.65*  --  4.46*  --  4.99*   < > = values in this interval not displayed.    Estimated Creatinine Clearance: 13.5 mL/min (A) (by C-G formula based on SCr of 4.99 mg/dL (H)).  Assessment: 65 yo m presenting with SOB. Patient is on apixaban prior to admission. Patient is ESRD on HD TTS with clotted access. Temp HD cath placed. Pharmacy consulted for heparin while apixaban on hold. Last dose 3/25 PM. Heparin level and aPTT correlating - monitoring via heparin level.   Heparin level *** on heparin 1150 units/hr. Hgb 8.3. Plt 182. iHD today unable to be completed by AVF and temp HD catheter used for iHD.   Goal of Therapy:  Heparin level 0.3-0.7 units/ml Monitor platelets by anticoagulation protocol: Yes   Plan:   Monitor heparin level, CBC and s/s of bleeding daily Follow up transition back to apixaban after HD access issues resolved   Cristela Felt, PharmD Clinical Pharmacist  08/15/2020 3:10 PM

## 2020-08-15 NOTE — Progress Notes (Addendum)
PROGRESS NOTE    CLAYBORNE DIVIS  KTG:[id number] DOB: 28-Jul-1955 DOA: 08/16/2020 PCP: [name], MD    Brief Narrative:  Mr. Etchison was admitted to the hospital with undifferentiated shock, in the setting of large left pleural effusion.  65 year old male past medical history for end-stage renal disease on hemodialysis, failed renal transplant currently on immunosuppression, systolic heart failure, atrial fibrillation, duodenal ulcer status post Whipple 2002, osteomyelitis, peripheral vascular disease status post bilateral BKA, history of Covid infection 05/2020, who presented with dyspnea.    He is a resident from a local nursing home, unfortunately he had missed 2 hemodialysis sessions due to inability to access right upper extremity fistula (suspected clotts).  In the emergency department patient was hypotensive with blood pressure 75/53, heart rate 127, temperature 37.5, respiratory rate 23, oxygen saturation 100%, his lungs had diminished breath sounds on the left side, increased work of breathing, no wheezing, heart S1-S2, present, rhythmic, abdomen was soft and nontender, positive bilateral BKA.  No rashes.  Sodium 139, potassium 4.8, chloride 102, bicarb 23, glucose 235, BUN 76, creatinine 6.79, AST 14, ALT 8, lactic acid 2.2, white count 6.5, hemoglobin 7.9, hematocrit 25.9, platelets 168. SARS COVID-19 negative.  Chest radiograph with large left pleural effusion.  EKG 130 bpm, normal axis, prolonged QTC 512, atrial fibrillation rhythm, no ST segment changes, Q-wave V1-V2, T wave inversions lead I, aVL, V4-V6,  Patient was placed to the intensive care unit, he was placed on vasopressors and underwent left-sided pigtail catheter placement. Initially received broad-spectrum IV antibiotic therapy  A temporary hemodialysis catheter was placed left IJ, and underwent hemodialysis.  Transferred to [facility] 3/29.  Assessment & Plan:   Principal Problem:   Hypotension Active Problems:    ESRD on hemodialysis (HCC)   DM (diabetes mellitus) ([place])   Atrial fibrillation (HCC)   Depression   S/P BKA (below knee amputation) bilateral (HCC)   Immunosuppression due to drug therapy ([place])   History of Whipple procedure   Heart failure with reduced ejection fraction ([place])   1. Shock undifferentiated, rule out for septic shock. Possible related to large left pleural effusion/ or vasoplegia.  Patient now off vasopressors, systolic blood pressure 428 to 138 mmHg. Continue close monitoring.  Antibiotics have been discontinued.   Continue with midodrine.   2. Large left loculated exudative pleural effusion/ acute hypoxemic respiratory failure. Chest tube to suction 20 cm with no air leak,  Fluid analysis consistent with exudate. Negative for acute infection.  SP tpa and dornase today. Oxygenation is 100 on 3 L/min per [place]. Chest tube drainage 930 ml over last 24 hrs.   Continue oxymetry monitoring and supplemental 02 per [place].\ Follow chest film in am.   3. ERSD on HD/ failed renal transplant. Has a temporary catheter at the left IJ. Today not able to cannulate AV fistula. Continue to follow nephrology recommendations.   Continue with immunosuppressive therapy (prednisone and tacrolimus)   4. Chronic diastolic heart failure/ atrial fibrillation. No clinical signs of exacerbation, continue with metoprolol and fluid management per HD and ultrafiltration.   Continue rate control with metoprolol, anticoagulation with heparin, in preparation for possible new or repair of HD access.   5. Severe protein calorie malnutrition. Continue with nutritional supplements.   6. PVD. Sp BKA bilaterally.   7. T2DM. Continue glucose cover and monitoring with insulin sliding scale.   8. Depression,. Continue with sertraline.   Patient continue to be at high risk for worsening hypotension   Status is: Inpatient  Remains inpatient appropriate because:Inpatient level of care appropriate due to  severity of illness   Dispo: The patient is from: SNF              Anticipated d/c is to: SNF              Patient currently is not medically stable to d/c.   Difficult to place patient No   DVT prophylaxis: Heparin  Code Status:   DNR   Family Communication:  No family at the bedside      Nutrition Status: Nutrition Problem: Increased nutrient needs Etiology: chronic illness (ESRD on HD) Signs/Symptoms: estimated needs Interventions: Nep[place] shake,MVI      Consultants:   Nephrology   Pulmonary   Procedures:   Left chest tube placement  HD catheter left IJ      Subjective: Patient continue to be very weak and deconditioned, no nausea or vomiting, no chest pain, stable dyspnea.   Objective: Vitals:   08/15/20 1330 08/15/20 1400 08/15/20 1430 08/15/20 1500  BP: 134/83 135/67 135/73 (!) 138/43  Pulse:      Resp: 14 15 16 20   Temp:      TempSrc:      SpO2:      Weight:      Height:        Intake/Output Summary (Last 24 hours) at 08/15/2020 1509 Last data filed at 08/15/2020 0600 Gross per 24 hour  Intake 191.3 ml  Output 930 ml  Net -738.7 ml   Filed Weights   08/14/20 0322 08/15/20 0500 08/15/20 1300  Weight: 66.1 kg 68.4 kg 65.4 kg    Examination:   General: Not in pain or dyspnea, deconditioned and ill looking appearing  Neurology: Awake and alert, non focal  E ENT: mild pallor, no icterus, oral mucosa moist Cardiovascular: No JVD. S1-S2 present, rhythmic, no gallops, rubs, or murmurs. No lower extremity edema. Pulmonary: vesicular breath sounds bilaterally, adequate air movement, no wheezing, rhonchi or rales. Gastrointestinal. Abdomen soft and non tender Skin. No rashes Musculoskeletal: bilateral BKA Left chest tune in place with no air leak     Data Reviewed: I have personally reviewed following labs and imaging studies  CBC: Recent Labs  Lab 07/24/2020 1328 07/23/2020 1336 08/07/2020 1852 08/12/20 0259 08/13/20 0427 08/14/20 0432  08/15/20 0244  WBC 6.5  --  6.9 7.7 6.7 8.4 8.9  NEUTROABS 5.5  --   --   --   --   --   --   HGB 7.9*   < > 7.4* 7.8* 7.8* 8.2* 8.3*  HCT 25.9*   < > 25.9* 25.8* 26.0* 26.2* 26.6*  MCV 98.5  --  99.6 98.5 96.7 96.0 96.7  PLT 168  --  174 184 146* 157 182   < > = values in this interval not displayed.   Basic Metabolic Panel: Recent Labs  Lab 08/10/2020 1328 07/26/2020 1336 08/16/2020 1852 08/12/20 0259 08/13/20 0427 08/14/20 0432 08/15/20 0244  NA 139 138  --  139 135 135 135  K 4.8 4.7  --  5.0 3.5 3.9 3.9  CL 102 100  --  100 99 101 100  CO2 23  --   --  29 28 26 26   GLUCOSE 235* 227*  --  129* 106* 124* 198*  BUN 76* 76*  --  79* 34* 48* 65*  CREATININE 6.59* 6.50* 6.61* 6.63* 3.65* 4.46* 4.99*  CALCIUM 7.6*  --   --  8.0* 7.7* 8.1* 8.1*  MG  --   --   --  2.1  --   --   --   PHOS  --   --   --  7.0*  --   --   --    GFR: Estimated Creatinine Clearance: 13.5 mL/min (A) (by C-G formula based on SCr of 4.99 mg/dL (H)). Liver Function Tests: Recent Labs  Lab 08/05/2020 1328  AST 14*  ALT 8  ALKPHOS 106  BILITOT 1.1  PROT 4.8*  ALBUMIN 1.7*   No results for input(s): LIPASE, AMYLASE in the last 168 hours. No results for input(s): AMMONIA in the last 168 hours. Coagulation Profile: No results for input(s): INR, PROTIME in the last 168 hours. Cardiac Enzymes: No results for input(s): CKTOTAL, CKMB, CKMBINDEX, TROPONINI in the last 168 hours. BNP (last 3 results) No results for input(s): PROBNP in the last 8760 hours. HbA1C: No results for input(s): HGBA1C in the last 72 hours. CBG: Recent Labs  Lab 08/14/20 1911 08/14/20 2314 08/15/20 0312 08/15/20 0729 08/15/20 1112  GLUCAP 217* 247* 178* 147* 149*   Lipid Profile: No results for input(s): CHOL, HDL, LDLCALC, TRIG, CHOLHDL, LDLDIRECT in the last 72 hours. Thyroid Function Tests: No results for input(s): TSH, T4TOTAL, FREET4, T3FREE, THYROIDAB in the last 72 hours. Anemia Panel: No results for input(s):  VITAMINB12, FOLATE, FERRITIN, TIBC, IRON, RETICCTPCT in the last 72 hours.    Radiology Studies: I have reviewed all of the imaging during this hospital visit personally     Scheduled Meds: . Chlorhexidine Gluconate Cloth  6 each Topical Q0600  . feeding supplement (NEPRO CARB STEADY)  237 mL Oral TID BM  . heparin sodium (porcine)      . insulin aspart  0-9 Units Subcutaneous Q4H  . lipase/protease/amylase  12,000 Units Oral TID AC  . metoprolol tartrate  25 mg Oral BID  . midodrine  10 mg Oral TID WC  . multivitamin  1 tablet Oral QHS  . pantoprazole  40 mg Oral Daily  . predniSONE  5 mg Oral Q breakfast  . sertraline  50 mg Oral QHS  . sodium chloride flush  10 mL Intracatheter Q8H  . sodium chloride flush  10-40 mL Intracatheter Q12H  . tacrolimus  2 mg Oral BID   Continuous Infusions: . heparin 1,150 Units/hr (08/15/20 0600)     LOS: 4 days        Maxtyn Nuzum Gerome Apley, MD

## 2020-08-15 NOTE — Progress Notes (Signed)
[place] for heparin Indication: atrial fibrillation  Allergies  Allergen Reactions  . Metformin And Related Other (See Comments)    Has kidney problems    Patient Measurements: Height: 6' (182.9 cm) (from previous records) Weight: 65.2 kg (143 lb 11.8 oz) IBW/kg (Calculated) : 77.6  Vital Signs: Temp: 97.1 F (36.2 C) (03/29 1641) Temp Source: Axillary (03/29 1641) BP: 135/105 (03/29 1800) Pulse Rate: 100 (03/29 1800)  Labs: Recent Labs    08/13/20 0427 08/14/20 0432 08/14/20 1620 08/15/20 0244 08/15/20 1326  HGB 7.8* 8.2*  --  8.3*  --   HCT 26.0* 26.2*  --  26.6*  --   PLT 146* 157  --  182  --   APTT  --   --  42*  --   --   HEPARINUNFRC 0.67 0.27* 0.18* 0.11* <0.10*  CREATININE 3.65* 4.46*  --  4.99*  --     Estimated Creatinine Clearance: 13.4 mL/min (A) (by C-G formula based on SCr of 4.99 mg/dL (H)).  Assessment: 65 y.o. male with ESRD  on heparin for Afib S/p HD tonight heparin drip continued during HD  Heparin drip 1150 uts/hr HL < 0.1 infusing fine   Goal of Therapy:  Heparin level 0.3-0.7 units/ml Monitor platelets by anticoagulation protocol: Yes   Plan:  Increase Heparin drip 1300  units/hr Check heparin level I and CBC daily   Bonnita Nasuti Pharm.D. CPP, BCPS Clinical Pharmacist [phone] 08/15/2020 6:52 PM

## 2020-08-15 NOTE — Progress Notes (Signed)
Hemodialysis- Tolerated well. Unable to UF due to bp drop during last hour into 87-86V systolic and HR increase >672CNO. UF 300cc with HD. Reported off to ICU RN. Patient currently is without complaints.

## 2020-08-15 NOTE — Progress Notes (Signed)
[facility] Progress Note  Subjective: Yesterday patient would like to continue with dialysis.  He has no complaints today.  Vitals:   08/15/20 0500 08/15/20 0600 08/15/20 0700 08/15/20 0731  BP: (!) 142/79 133/84 128/86   Pulse: 95 82 92   Resp: 16 17 14    Temp:    (!) 96.4 F (35.8 C)  TempSrc:    Axillary  SpO2: 100% 100% 91%   Weight: 68.4 kg     Height:        Exam: GEN: chronically ill appearing, lying in bed in nad ENT: no nasal discharge, mmm EYES: no scleral icterus, eomi CV: tachycardia, irregular rhythm PULM: no iwob, bilateral chest rise ABD: NABS, non-distended SKIN: no rashes or jaundice EXT: no edema, warm and well perfused RUE AVF: Very collapsible with fair augmentation but appears patent with bruit and thrill   OP HD: [facility] TTS     3.5h  400/500   66kg  3K/2.5Ca bath  AVF  Hep none  - calcitriol 1 tiw  - venofer 100 x 5 , started 3/19  - mircera 100 q2 (last dose during prior admit)   Assessment/ Plan: 1. Hypotension - suspected septic shock. Recent  admission w Enterococcus bacteremia. Large left pleural effusion on CXR. SP chest tube placement w/ ^^drainage. NG on cultures at this time. Hypotension greatly improved. 2. AFib RVR --Usual meds Eliquis, metoprolol as tolerated w/ sepsis 3. ESRD -  HD TTS. Missed HD x 2 this d/t clotted access. S/p declot at [facility] 3/24.  There was concern that his access had clotted again but this was likely due to hypotension and highly collapsible AVF demonstrating minimal flow.  Appears patent today.  Temp cath placed and had HD Sat 3/26 afternoon. Plan to perform HD today and can remove temp cath if AVF use is successful 4. Hypotension/volume  - shock resolved. Appears euvolemic 5. Anemia  - Continue outpatient ESA. Holding iron given recent infection 6. Metabolic bone disease -  Continue Auryixa binders/VDRA 7. T1DM - Insulin per primary      Reesa Chew  08/13/2020, 9:58 AM   Recent Labs  Lab  08/12/20 0259 08/13/20 0427 08/14/20 0432 08/15/20 0244  K 5.0   < > 3.9 3.9  BUN 79*   < > 48* 65*  CREATININE 6.63*   < > 4.46* 4.99*  CALCIUM 8.0*   < > 8.1* 8.1*  PHOS 7.0*  --   --   --   HGB 7.8*   < > 8.2* 8.3*   < > = values in this interval not displayed.   Inpatient medications: . alteplase (TPA) for intrapleural administration  10 mg Intrapleural Once   And  . pulmozyme (DORNASE) for intrapleural administration  5 mg Intrapleural Once  . Chlorhexidine Gluconate Cloth  6 each Topical Q0600  . feeding supplement (NEPRO CARB STEADY)  237 mL Oral TID BM  . insulin aspart  0-9 Units Subcutaneous Q4H  . lipase/protease/amylase  12,000 Units Oral TID AC  . metoprolol tartrate  25 mg Oral BID  . midodrine  10 mg Oral TID WC  . multivitamin  1 tablet Oral QHS  . pantoprazole  40 mg Oral Daily  . predniSONE  5 mg Oral Q breakfast  . sertraline  50 mg Oral QHS  . sodium chloride flush  10 mL Intracatheter Q8H  . sodium chloride flush  10-40 mL Intracatheter Q12H  . tacrolimus  2 mg Oral BID   .  heparin 1,150 Units/hr (08/15/20 0600)   docusate sodium, heparin, polyethylene glycol, sodium chloride flush

## 2020-08-15 NOTE — Progress Notes (Addendum)
Hemodialysis- AVF cannulated with 15g needles x2. Slow pulsation on arterial needle but flushes well. Treatment initiated. AP -250 at 150bfr. Needle now will not flush, clots noted in line. Needle pulled. Attempted to cannulate higher. Good pulsation but bleeding profusely around hub of needle. Needle pulled again. Surgifoam applied. Unable to cannulate higher due to anatomy of avf/incision from declot/thin skin. Treatment was initiated via L IJ HD catheter. Dr. Joylene Grapes notified. Patient currently without complaints. Sats 100% on 4L [place]. Continue to monitor.

## 2020-08-15 NOTE — Progress Notes (Signed)
[place] for heparin Indication: atrial fibrillation  Allergies  Allergen Reactions  . Metformin And Related Other (See Comments)    Has kidney problems    Patient Measurements: Height: 6' (182.9 cm) (from previous records) Weight: 66.1 kg (145 lb 11.6 oz) IBW/kg (Calculated) : 77.6  Vital Signs: Temp: 97.5 F (36.4 C) (03/28 2326) Temp Source: Oral (03/28 2326) BP: 121/81 (03/29 0300) Pulse Rate: 115 (03/29 0300)  Labs: Recent Labs    08/12/20 1133 08/12/20 1841 08/13/20 0427 08/13/20 0427 08/14/20 0432 08/14/20 1620 08/15/20 0244  HGB  --   --  7.8*   < > 8.2*  --  8.3*  HCT  --   --  26.0*  --  26.2*  --  26.6*  PLT  --   --  146*  --  157  --  182  APTT 43* 101*  --   --   --  42*  --   HEPARINUNFRC  --   --  0.67   < > 0.27* 0.18* 0.11*  CREATININE  --   --  3.65*  --  4.46*  --  4.99*   < > = values in this interval not displayed.    Estimated Creatinine Clearance: 13.6 mL/min (A) (by C-G formula based on SCr of 4.99 mg/dL (H)).  Assessment: 65 y.o. male with Afib for heparin  Goal of Therapy:  Heparin level 0.3-0.7 units/ml Monitor platelets by anticoagulation protocol: Yes   Plan:  Increase Heparin  1150 units/hr Check heparin level in 8 hours.  Phillis Knack, PharmD, BCPS  08/15/2020 4:08 AM

## 2020-08-16 ENCOUNTER — Inpatient Hospital Stay (HOSPITAL_COMMUNITY): Payer: Medicare Other

## 2020-08-16 DIAGNOSIS — Z9689 Presence of other specified functional implants: Secondary | ICD-10-CM | POA: Diagnosis not present

## 2020-08-16 DIAGNOSIS — I9589 Other hypotension: Secondary | ICD-10-CM | POA: Diagnosis not present

## 2020-08-16 DIAGNOSIS — J90 Pleural effusion, not elsewhere classified: Secondary | ICD-10-CM | POA: Diagnosis not present

## 2020-08-16 DIAGNOSIS — N186 End stage renal disease: Secondary | ICD-10-CM | POA: Diagnosis not present

## 2020-08-16 DIAGNOSIS — F32A Depression, unspecified: Secondary | ICD-10-CM | POA: Diagnosis not present

## 2020-08-16 DIAGNOSIS — I480 Paroxysmal atrial fibrillation: Secondary | ICD-10-CM | POA: Diagnosis not present

## 2020-08-16 LAB — BASIC METABOLIC PANEL
Anion gap: 9 (ref 5–15)
BUN: 44 mg/dL — ABNORMAL HIGH (ref 8–23)
CO2: 26 mmol/L (ref 22–32)
Calcium: 7.7 mg/dL — ABNORMAL LOW (ref 8.9–10.3)
Chloride: 99 mmol/L (ref 98–111)
Creatinine, Ser: 3.56 mg/dL — ABNORMAL HIGH (ref 0.61–1.24)
GFR, Estimated: 18 mL/min — ABNORMAL LOW (ref >60)
Glucose, Bld: 217 mg/dL — ABNORMAL HIGH (ref 70–99)
Potassium: 3.5 mmol/L (ref 3.5–5.1)
Sodium: 134 mmol/L — ABNORMAL LOW (ref 135–145)

## 2020-08-16 LAB — CULTURE, BLOOD (SINGLE): Culture: NO GROWTH

## 2020-08-16 LAB — CBC
HCT: 27.7 % — ABNORMAL LOW (ref 39.0–52.0)
Hemoglobin: 8.5 g/dL — ABNORMAL LOW (ref 13.0–17.0)
MCH: 29.9 pg (ref 26.0–34.0)
MCHC: 30.7 g/dL (ref 30.0–36.0)
MCV: 97.5 fL (ref 80.0–100.0)
Platelets: 116 10*3/uL — ABNORMAL LOW (ref 150–400)
RBC: 2.84 MIL/uL — ABNORMAL LOW (ref 4.22–5.81)
RDW: 16.4 % — ABNORMAL HIGH (ref 11.5–15.5)
WBC: 8.3 10*3/uL (ref 4.0–10.5)
nRBC: 0 % (ref 0.0–0.2)

## 2020-08-16 LAB — CULTURE, BLOOD (ROUTINE X 2)
Culture: NO GROWTH
Culture: NO GROWTH

## 2020-08-16 LAB — HEPARIN LEVEL (UNFRACTIONATED)
Heparin Unfractionated: <0.1 IU/mL — ABNORMAL LOW (ref 0.30–0.70)
Heparin Unfractionated: 0.12 IU/mL — ABNORMAL LOW (ref 0.30–0.70)
Heparin Unfractionated: 0.34 IU/mL (ref 0.30–0.70)

## 2020-08-16 LAB — GLUCOSE, CAPILLARY
Glucose-Capillary: 138 mg/dL — ABNORMAL HIGH (ref 70–99)
Glucose-Capillary: 141 mg/dL — ABNORMAL HIGH (ref 70–99)
Glucose-Capillary: 151 mg/dL — ABNORMAL HIGH (ref 70–99)
Glucose-Capillary: 154 mg/dL — ABNORMAL HIGH (ref 70–99)
Glucose-Capillary: 166 mg/dL — ABNORMAL HIGH (ref 70–99)
Glucose-Capillary: 217 mg/dL — ABNORMAL HIGH (ref 70–99)

## 2020-08-16 MED ORDER — STERILE WATER FOR INJECTION IJ SOLN
5.0000 mg | Freq: Once | RESPIRATORY_TRACT | Status: AC
  Administered 2020-08-16: 5 mg via INTRAPLEURAL
  Filled 2020-08-16: qty 5

## 2020-08-16 MED ORDER — HEPARIN (PORCINE) 25000 UT/250ML-% IV SOLN
2400.0000 [IU]/h | INTRAVENOUS | Status: DC
  Administered 2020-08-16 – 2020-08-18 (×5): 1700 [IU]/h via INTRAVENOUS
  Administered 2020-08-20: 1900 [IU]/h via INTRAVENOUS
  Administered 2020-08-20: 2200 [IU]/h via INTRAVENOUS
  Filled 2020-08-16 (×8): qty 250

## 2020-08-16 MED ORDER — SODIUM CHLORIDE (PF) 0.9 % IJ SOLN
10.0000 mg | Freq: Once | INTRAMUSCULAR | Status: AC
  Administered 2020-08-16: 10 mg via INTRAPLEURAL
  Filled 2020-08-16: qty 10

## 2020-08-16 NOTE — Progress Notes (Signed)
[facility] Progress Note  Subjective: Tolerated dialysis yesterday. However, had issues with AVF, pulling clots from arterial side and poor flow so had to use catheter. Patient without other complaints.  Vitals:   08/16/20 0600 08/16/20 0700 08/16/20 0748 08/16/20 0800  BP:    133/82  Pulse: 91 (!) 115  87  Resp: 19 16  20   Temp:   98.1 F (36.7 C)   TempSrc:   Axillary   SpO2: 99% 100%  100%  Weight:      Height:        Exam: GEN: chronically ill appearing, lying in bed in nad ENT: no nasal discharge, mmm EYES: no scleral icterus, eomi CV: tachycardia, irregular rhythm PULM: no iwob, bilateral chest rise ABD: NABS, non-distended SKIN: no rashes or jaundice EXT: no edema, warm and well perfused RUE AVF: Thrill present, minimal pulsatility, highly collapsable   OP HD: NW TTS     3.5h  400/500   66kg  3K/2.5Ca bath  AVF  Hep none  - calcitriol 1 tiw  - venofer 100 x 5 , started 3/19  - mircera 100 q2 (last dose during prior admit)   Assessment/ Plan: 1. Hypotension - suspected septic shock. Recent  admission w Enterococcus bacteremia. Large left pleural effusion on CXR. SP chest tube placement w/ ^^drainage. NG on cultures at this time. Hypotension greatly improved. 2. AFib RVR --Usual meds Eliquis, metoprolol as tolerated w/ sepsis. On heparin currently 3. ESRD -  HD TTS. Maintain schedule for now 4. RUE AVF issues: Missed HD x 2 this d/t clotted access. S/p declot at [facility] 3/24.  There was concern that his access had clotted again but this was likely due to hypotension and highly collapsible AVF demonstrating minimal flow.  Temp cath placed and had HD Sat 3/26 afternoon. Attempted to do HD w/ AVF on 3/30 but arterial side malfunctioned. Given the high collapsibility and difficulties with arterial side he may have an inflow lesion which limits his access function. Will try to use it again tomorrow but if issue persists would consider fistulogram w/ IR and/or VVS  involvement. 5. Hypotension/volume  - shock resolved. Appears euvolemic 6. Anemia  - Continue outpatient ESA. Holding iron given recent infection 7. Metabolic bone disease -  Continue Auryixa binders/VDRA 8. T1DM - Insulin per primary      Reesa Chew  08/13/2020, 9:05 AM   Recent Labs  Lab 08/12/20 0259 08/13/20 0427 08/15/20 0244 08/16/20 0110  K 5.0   < > 3.9 3.5  BUN 79*   < > 65* 44*  CREATININE 6.63*   < > 4.99* 3.56*  CALCIUM 8.0*   < > 8.1* 7.7*  PHOS 7.0*  --   --   --   HGB 7.8*   < > 8.3* 8.5*   < > = values in this interval not displayed.   Inpatient medications: . alteplase (TPA) for intrapleural administration  10 mg Intrapleural Once   And  . pulmozyme (DORNASE) for intrapleural administration  5 mg Intrapleural Once  . Chlorhexidine Gluconate Cloth  6 each Topical Q0600  . feeding supplement (NEPRO CARB STEADY)  237 mL Oral TID BM  . insulin aspart  0-9 Units Subcutaneous Q4H  . lipase/protease/amylase  12,000 Units Oral TID AC  . metoprolol tartrate  25 mg Oral BID  . midodrine  10 mg Oral TID WC  . multivitamin  1 tablet Oral QHS  . pantoprazole  40 mg Oral Daily  .  predniSONE  5 mg Oral Q breakfast  . sertraline  50 mg Oral QHS  . sodium chloride flush  10 mL Intracatheter Q8H  . sodium chloride flush  10-40 mL Intracatheter Q12H  . tacrolimus  2 mg Oral BID   . heparin 1,500 Units/hr (08/16/20 0800)   docusate sodium, heparin, metoprolol tartrate, polyethylene glycol, sodium chloride flush

## 2020-08-16 NOTE — Progress Notes (Signed)
Occupational Therapy Treatment Patient Details Name: Paul [name] MRN: [id number] DOB: 1955/07/25 Today's Date: 08/16/2020    History of present illness 65 yo M extensive PMH including ESRD s/p failed renal transplant - on THSa iHD, chronic immunosuppression, systolic heart failure, Afib on eliquis, duodenal cancer s/p whipple (2002), osteomyelitis and critical limb ischemia s/p Bilat BKAs, COVID, recent enterococcus bacteremia requiring hospitalization, who presented to ED 3/25 from [facility] home with SOB. SOB has increased over the past week -- associated with missing HD x3 due to inability to access the patient's RUE AVF.  Pt hypotensive in ED with SBPs 70s-- received small IVF bolus and started on levo. CXR revealed large L pleural effusion. Access issues for HD persist. Palliative consult with pt deciding DNR and wants to continue HD and rehab.   OT comments  Pt remained very fatigued throughout session; lifting BUEs and BLEs and assist for ADL/mobility. Pt required totalA for donning prosthesis due to poor strength. Pt requiring maxA to eventually totalA for sitting EOB ~4 mins. Pt quickly fatigued and unable to tolerate sitting for very long due to back pain. Pt supine: 127/62, 113 BPM; sitting 170/83, 106 BPM; O2 >87-95% on 2LO2. Pt would benefit from continued OT skilled services. OT following acutely. Palliative saw pt and pt is DNR, but accepting therapy and HD.    Follow Up Recommendations  Supervision/Assistance - 24 hour;SNF    Equipment Recommendations  None recommended by OT    Recommendations for Other Services      Precautions / Restrictions Precautions Precautions: Other (comment);Fall Precaution Comments: chest tube, temp L IJ HD cath Restrictions Weight Bearing Restrictions: No       Mobility Bed Mobility Overal bed mobility: Needs Assistance Bed Mobility: Rolling;Supine to Sit;Sit to Supine Rolling: Min guard   Supine to sit: Max assist;+2 for  physical assistance Sit to supine: Max assist;+2 for physical assistance   General bed mobility comments: Pt assisted with legs over EOB; pt attempted to push up onto left forearm to sit upright. Pt returned to supine to slide to [facility] and put into chair position; however, could not tolerate due to back pain and pt rolled himself onto his left side.    Transfers                 General transfer comment: unable due to weakness    Balance Overall balance assessment: Needs assistance Sitting-balance support: Bilateral upper extremity supported;Feet supported Sitting balance-Leahy Scale: Zero Sitting balance - Comments: Pt requiring maxA to eventually totalA for sitting EOB ~4 mins. Pt quickly fatigues and unable to tolerate sitting for very long due to back pain. Postural control: Posterior lean                                 ADL either performed or assessed with clinical judgement   ADL Overall ADL's : Needs assistance/impaired                     Lower Body Dressing: Total assistance;Bed level Lower Body Dressing Details (indicate cue type and reason): donning prosthetics in bed with totalA; pt attempting, but very fatigued and required energy for the rest of the session.             Functional mobility during ADLs: Maximal assistance;Total assistance;+2 for physical assistance;+2 for safety/equipment General ADL Comments: Pt remained very fatigued throughout session; lifting BUEs and BLEs  and assist for ADL/mobility. Pt required totalA for donning prosthesis due to poor strength.     Vision Baseline Vision/History: No visual deficits Patient Visual Report: No change from baseline Vision Assessment?: No apparent visual deficits   Perception     Praxis      Cognition Arousal/Alertness: Lethargic Behavior During Therapy: Flat affect Overall Cognitive Status: Within Functional Limits for tasks assessed                                           Exercises     Shoulder Instructions       General Comments supine: 127/62, 113 BPM; sitting 170/83, 106 BPM; O2 >87-95% on 2LO2.    Pertinent Vitals/ Pain       Pain Assessment: Faces Faces Pain Scale: Hurts even more Pain Location: back Pain Descriptors / Indicators: Grimacing Pain Intervention(s): Limited activity within patient's tolerance;Monitored during session  [place] expects to be discharged to:: Private residence Living Arrangements: Spouse/significant other Available Help at Discharge: Family;Available 24 hours/day Type of Home: House Home Access: Ramped entrance     Home Layout: Two level;Able to live on main level with bedroom/bathroom Alternate Level Stairs-Number of Steps: 1 step down into the living room Alternate Level Stairs-Rails: None Bathroom Shower/Tub: Occupational psychologist: Standard Bathroom Accessibility: Yes   Home Equipment: Cane - single point;Shower seat;Wheelchair - manual;Crutches;Walker - 2 wheels;Hand held shower head;Grab bars - toilet;Shower seat - built in   Additional Comments: was in OP PT; BLE prosthetics      Prior Functioning/Environment Level of Independence: Independent with assistive device(s)        Comments: Prior to recent hospitalization (06/11/20-07/20/20), pt ambulated mod I with B prosthetic LEs and RW. Mod I ADLs. Pt has been at SNF since d/c from hospital.   Frequency  Min 2X/week        Progress Toward Goals  OT Goals(current goals can now be found in the care plan section)     Acute Rehab OT Goals Patient Stated Goal: unsure OT Goal Formulation: With patient Time For Goal Achievement: 08/27/20 Potential to Achieve Goals: Fair  Plan      Co-evaluation    PT/OT/SLP Co-Evaluation/Treatment: Yes Reason for Co-Treatment: Complexity of the patient's impairments (multi-system involvement) PT goals addressed during session: Mobility/safety with  mobility;Strengthening/ROM OT goals addressed during session: ADL's and self-care      AM-PAC OT "6 Clicks" Daily Activity     Outcome Measure   Help from another person eating meals?: Total Help from another person taking care of personal grooming?: Total Help from another person toileting, which includes using toliet, bedpan, or urinal?: Total Help from another person bathing (including washing, rinsing, drying)?: Total Help from another person to put on and taking off regular upper body clothing?: A Lot Help from another person to put on and taking off regular lower body clothing?: Total 6 Click Score: 7    End of Session Equipment Utilized During Treatment: Oxygen  OT Visit Diagnosis: Unsteadiness on feet (R26.81);Muscle weakness (generalized) (M62.81);Pain Pain - part of body:  (back)   Activity Tolerance Patient limited by pain;Patient limited by lethargy;Treatment limited secondary to medical complications (Comment)   Patient Left in bed;with call bell/phone within reach;with bed alarm set;with family/visitor present   Nurse Communication Mobility status        Time: 1110-1141 OT Time Calculation (  min): 31 min  Charges: OT General Charges $OT Visit: 1 Visit OT Treatments $Therapeutic Activity: 8-22 mins  Jefferey Pica, OTR/L Acute Rehabilitation Services Pager: [phone] Office: [phone]    Rhythm Wigfall C 08/16/2020, 4:27 PM

## 2020-08-16 NOTE — Procedures (Signed)
Pleural Fibrinolytic Administration Procedure Note  Paul [name]  [id number]  [birth year]  Date:08/16/20  Time:1:28 PM   Provider Performing:Lorraina Spring [name]   Procedure: Pleural Fibrinolysis Subsequent day [phone])  Indication(s) Fibrinolysis of complicated pleural effusion  Consent Risks of the procedure as well as the alternatives and risks of each were explained to the patient and/or caregiver.  Consent for the procedure was obtained.   Anesthesia None   Time Out Verified patient identification, verified procedure, site/side was marked, verified correct patient position, special equipment/implants available, medications/allergies/relevant history reviewed, required imaging and test results available.   Sterile Technique Hand hygiene, gloves   Procedure Description Existing pleural catheter was cleaned and accessed in sterile manner.  10mg  of tPA in 30cc of saline and 5mg  of dornase in 30cc of sterile water were injected into pleural space using existing pleural catheter.  Catheter will be clamped for 1 hour and then placed back to suction.   Complications/Tolerance None; patient tolerated the procedure well.   EBL None   Specimen(s) None  Julian Hy, DO 08/16/20 1:29 PM [facility]

## 2020-08-16 NOTE — Progress Notes (Addendum)
ANTICOAGULATION CONSULT NOTE - Follow Up Consult  Pharmacy Consult for IV Heparin Indication: atrial fibrillation  Allergies  Allergen Reactions  . Metformin And Related Other (See Comments)    Has kidney problems    Patient Measurements: Height: 6' (182.9 cm) (from previous records) Weight: 63.9 kg (140 lb 14 oz) IBW/kg (Calculated) : 77.6  Heparin Dosing Weight: 63.9 kg  Vital Signs: Temp: 97.4 F (36.3 C) (03/30 2027) Temp Source: Oral (03/30 2027) BP: 157/125 (03/30 2000) Pulse Rate: 130 (03/30 2000)  Labs: Recent Labs    08/14/20 0432 08/14/20 1620 08/15/20 0244 08/15/20 1326 08/16/20 0110 08/16/20 1021 08/16/20 2026  HGB 8.2*  --  8.3*  --  8.5*  --   --   HCT 26.2*  --  26.6*  --  27.7*  --   --   PLT 157  --  182  --  116*  --   --   APTT  --  42*  --   --   --   --   --   HEPARINUNFRC 0.27* 0.18* 0.11*   < > <0.10* 0.12* 0.34  CREATININE 4.46*  --  4.99*  --  3.56*  --   --    < > = values in this interval not displayed.    Estimated Creatinine Clearance: 18.4 mL/min (A) (by C-G formula based on SCr of 3.56 mg/dL (H)).  Assessment: 65 y.o. male with ESRD (S/P failed renal transplant, on HD) on IV heparin for atrial fibrillation.  Earlier today, heparin level was subtherapeutic at 0.12 units/ml,  but increasing on heparin infusion at 1500 units/hr. Chest tube in place, S/P tPA + dornase this AM. Hgb low-stable at 8.5 Platelets have dropped from 182 to 116 - possibly due to recent chest tube with pleural fibrinolysis - output serosanguinous with very few clots. No overt bleeding noted.  Will monitor closely. Next HD tomorrow.   Heparin level ~8.5 hrs after heparin infusion was increased to 1700 units/hr was 0.34 units/ml, which is within the goal range for this pt. Per RN, no issues with IV or bleeding observed.  Goal of Therapy:  Heparin level 0.3-0.7 units/ml Monitor platelets by anticoagulation protocol: Yes   Plan:  Continue heparin infusion at  1700 units/hr Check confirmatory heparin level in 8 hrs Monitor daily heparin level, CBC Monitor for bleeding  Gillermina Hu, PharmD, BCPS, [facility] Clinical Pharmacist 08/16/2020 9:31 PM

## 2020-08-16 NOTE — Progress Notes (Addendum)
[place] for heparin Indication: atrial fibrillation  Allergies  Allergen Reactions  . Metformin And Related Other (See Comments)    Has kidney problems    Patient Measurements: Height: 6' (182.9 cm) (from previous records) Weight: 63.9 kg (140 lb 14 oz) IBW/kg (Calculated) : 77.6  Vital Signs: Temp: 97.6 F (36.4 C) (03/30 1116) Temp Source: Axillary (03/30 1116) BP: 121/82 (03/30 1000) Pulse Rate: 75 (03/30 1000)  Labs: Recent Labs    08/14/20 0432 08/14/20 1620 08/15/20 0244 08/15/20 1326 08/16/20 0110 08/16/20 1021  HGB 8.2*  --  8.3*  --  8.5*  --   HCT 26.2*  --  26.6*  --  27.7*  --   PLT 157  --  182  --  116*  --   APTT  --  42*  --   --   --   --   HEPARINUNFRC 0.27* 0.18* 0.11* <0.10* <0.10* 0.12*  CREATININE 4.46*  --  4.99*  --  3.56*  --     Estimated Creatinine Clearance: 18.4 mL/min (A) (by C-G formula based on SCr of 3.56 mg/dL (H)).  Assessment: 65 y.o. male with Afib for heparin.  Heparin level is SUB-therapeutic at 0.12 but increasing on 1500 units/hr. Chest tube in place s/p tPA +dornase this AM.  Hgb low-stable at 8.5 Platelets have dropped from 182 to 116 - possibly due to recent chest tube with pleural fibrinolysis- output serosanguinous with very few clots. No overt bleeding noted.  Will monitor closely. Next HD tomorrow.   Goal of Therapy:  Heparin level 0.3-0.7 units/ml Monitor platelets by anticoagulation protocol: Yes   Plan:  Increase Heparin to 1700 units/hr. Recheck heparin level  in 8 hours. Heparin level and CBC daily.   Sloan Leiter, PharmD, BCPS, BCCCP Clinical Pharmacist Please refer to [facility] for [place] numbers 08/16/2020 11:44 AM

## 2020-08-16 NOTE — Progress Notes (Signed)
Physical Therapy Treatment Patient Details Name: Paul [name] MRN: [id number] DOB: 1956/03/13 Today's Date: 08/16/2020    History of Present Illness 65 yo M extensive PMH including ESRD s/p failed renal transplant - on THSa iHD, chronic immunosuppression, systolic heart failure, Afib on eliquis, duodenal cancer s/p whipple (2002), osteomyelitis and critical limb ischemia s/p Bilat BKAs, COVID, recent enterococcus bacteremia requiring hospitalization, who presented to ED 3/25 from [place] home with SOB. SOB has increased over the past week -- associated with missing HD x3 due to inability to access the patient's RUE AVF.  Pt hypotensive in ED with SBPs 70s-- received small IVF bolus and started on levo. CXR revealed large L pleural effusion. Access issues for HD persist. Palliative consult with pt deciding DNR and wants to continue HD and rehab.    PT Comments    Noted per palliative note that pt wants to continue HD and PT/OT. Patient agreeable to intervention and participated despite fatigue. Patient tolerated sitting EOB ~4 minutes with +2 max progressing to total assist with bil prostheses donned prior to sitting at edge. Patient can benefit from education in exercise program he can work on on his own between therapy sessions.     Follow Up Recommendations  SNF;Supervision/Assistance - 24 hour     Equipment Recommendations  Hospital bed;Wheelchair (measurements PT);Wheelchair cushion (measurements PT);3in1 (PT);Other (comment) (hoyer lift)    Recommendations for Other Services       Precautions / Restrictions Precautions Precautions: Other (comment);Fall Precaution Comments: chest tube, temp L IJ HD cath    Mobility  Bed Mobility Overal bed mobility: Needs Assistance Bed Mobility: Rolling;Supine to Sit;Sit to Supine Rolling: Min guard   Supine to sit: Max assist;+2 for physical assistance Sit to supine: Max assist;+2 for physical assistance   General bed  mobility comments: assisted with legs over EOB (after bil prostheses donned for pt in supine), pt attempted to push up onto left forearm and then up to sit. Returned to supine and put into chair position however could not tolerate due to back pain and pt rolled himself onto his left side.    Transfers                 General transfer comment: unable due to weakness  Ambulation/Gait                 Stairs             Wheelchair Mobility    Modified Rankin (Stroke Patients Only)       Balance Overall balance assessment: Needs assistance Sitting-balance support: Bilateral upper extremity supported;Feet supported Sitting balance-Leahy Scale: Zero Sitting balance - Comments: pt extremely weak and quickly fatigued from max assist to total assist (total time sitting and working on balance ~4 minutes) Postural control: Posterior lean                                  Cognition Arousal/Alertness: Lethargic Behavior During Therapy: Flat affect Overall Cognitive Status: Within Functional Limits for tasks assessed                                        Exercises      General Comments General comments (skin integrity, edema, etc.): VSS throughout on ICU monitors      Pertinent Vitals/Pain Pain Assessment:  Faces Faces Pain Scale: Hurts even more Pain Location: back Pain Descriptors / Indicators: Grimacing Pain Intervention(s): Limited activity within patient's tolerance;Monitored during session;Repositioned    Home Living                      Prior Function            PT Goals (current goals can now be found in the care plan section) Acute Rehab PT Goals Patient Stated Goal: unsure Time For Goal Achievement: 08/27/20 Potential to Achieve Goals: Fair Progress towards PT goals: Progressing toward goals    Frequency    Min 2X/week      PT Plan Current plan remains appropriate    Co-evaluation PT/OT/SLP  Co-Evaluation/Treatment: Yes Reason for Co-Treatment: Complexity of the patient's impairments (multi-system involvement);For patient/therapist safety;To address functional/ADL transfers PT goals addressed during session: Mobility/safety with mobility;Strengthening/ROM        AM-PAC PT "6 Clicks" Mobility   Outcome Measure  Help needed turning from your back to your side while in a flat bed without using bedrails?: A Lot Help needed moving from lying on your back to sitting on the side of a flat bed without using bedrails?: A Lot Help needed moving to and from a bed to a chair (including a wheelchair)?: Total Help needed standing up from a chair using your arms (e.g., wheelchair or bedside chair)?: Total Help needed to walk in hospital room?: Total Help needed climbing 3-5 steps with a railing? : Total 6 Click Score: 8    End of Session Equipment Utilized During Treatment: Oxygen Activity Tolerance: Patient limited by fatigue Patient left: in bed;with call bell/phone within reach;with bed alarm set;with family/visitor present Nurse Communication: Mobility status PT Visit Diagnosis: Other abnormalities of gait and mobility (R26.89);Muscle weakness (generalized) (M62.81)     Time: 3435-6861 PT Time Calculation (min) (ACUTE ONLY): 32 min  Charges:                         Arby Barrette, PT Pager [phone]    Rexanne Mano 08/16/2020, 12:43 PM

## 2020-08-16 NOTE — Progress Notes (Signed)
NAME:  Paul [name], MRN:  [id number], DOB:  1955/06/18, LOS: 5 ADMISSION DATE:  08/03/2020, CONSULTATION DATE:  07/24/2020 REFERRING MD:  Ron Parker- EM, CHIEF COMPLAINT:  SOB  History of Present Illness:  65 yo M extensive PMH including ESRD s/p failed renal transplant - on THSa iHD, chronic immunosuppression, systolic heart failure, Afib on eliquis, duodenal cancer s/p whipple (2002), osteomyelitis and critical limb ischemia s/p Bilat BKAs, COVID, recent enterococcus bacteremia requiring hospitalization, who presented to ED 3/25 from [place] home with SOB. SOB has increased over the past week -- associated with missing HD x3 due to inability to access the patient's RUE AVF.  Pt hypotensive in ED with SBPs 70s-- received small IVF bolus and started on levo. CXR revealed large L pleural effusion. CMP obtained which did not reveal any absolute indications for emergent HD. CBC with WBC 6.5, hgb 7.9, plt 168. LA 2.2   Critical care consulted for admission  Pertinent  Medical History  Afib on eliquis Duodenal cancer s/p whipple ESRD Failed renal transplant Chronic immunosuppression  DM Gangrene Osteomyelitis Critical limb ischemia calciphylaxis S/p bilateral BKAs  HTN  COVID Enterococcus bacteremia  Combined systolic and diastolic heart failure, EF 25-30% Pleural effusion  IDDM  Significant Hospital Events: Including procedures, antibiotic start and stop dates in addition to other pertinent events   . 3/25 presented to ED from nursing home with SOB after multiple missed HD runs due to inability to access fistula. Hypotensive in ED, started on pressors. CXR with large L pleural effusion. Blood cx sent. Started on empiric abx for possible sepsis. Admitting to ICU, Planning for temp HD cath placement and likely pigtail placement  . 3/26: HD . 3/28 CXR >> No improvement in pleural effusion . 3/28: tPA and dornase  Interim History / Subjective:  His breathing feels better  today. He has been mostly laying in bed per RN. They were not able to use his fistula yesterday so HD catheter needs to stay in place.  Objective   Blood pressure (!) 165/90, pulse (!) 105, temperature 97.6 F (36.4 C), temperature source Axillary, resp. rate (!) 23, height 6' (1.829 m), weight 63.9 kg, SpO2 100 %.        Intake/Output Summary (Last 24 hours) at 08/16/2020 1330 Last data filed at 08/16/2020 1100 Gross per 24 hour  Intake 630.84 ml  Output 1455 ml  Net -824.16 ml   Filed Weights   08/15/20 1300 08/15/20 1641 08/16/20 0500  Weight: 65.4 kg 65.2 kg 63.9 kg    Examination: Constitutional: chronically ill appearing man laying in bed in NAD HEENT: [place]/AT, eyes anicteric Neck: HD catheter in place Cardiovascular: reg rate, irreg rhythm Respiratory: still reduced left sided breath sounds, no tachypnea. Amber clear pleural fluid draining. Gastrointestinal: soft, NT, ND Extremities: no edema or cyanosis Skin: warm, dry, no rashes Neurologic: awake, answering questions appropriately, moving all extremities spontaneously   Labs/imaging that I have personally reviewed  (right click and "Reselect all SmartList Selections" daily)  Blood cx 3/25> NG Pleural fluid cx 3/25 NG  CXR> smaller lateral component to pleural effusion  Resolved Hospital Problem list    Assessment & Plan:   Acute respiratory failure with hypoxia Left loculated pleural effusion-- improving with intrapleural lytics Found on admission now s/p chest tube placement. Pleural fluid with lymphocyte predominant transudate. Repeat CXR shows moderate left pleural effusion w/ possible nondependent gas. Chest tube with minimal drainage. O2 sats above 90% on 3 L [place].  P: --  keep chest tube to -20cm H2O suction --flush chest tube per protocol --TPA and dornase daily x 3 days- last dose today -CXR tomorrow morning  ESRD on TTS HD s/p failed renal transplant on immunosuppressants.  Poor AVF function Patient  missed HD x2 due to clotted access. Access was declotted on 3/24 but clotted again 2/2 hypotension. Received temp HD cath and dialysis on 3/26. Currently euvolemic with slight worsening of kidney function but no electrolyte abnormalities.  P: --keep dialysis catheter for now  Rest of care per primary.  Best practice (right click and "Reselect all SmartList Selections" daily)  Per primary  Julian Hy, DO 08/16/20 2:07 PM [facility]  For contact information, see Amion. If no response to pager, please call PCCM consult pager. After hours, 7PM- 7AM, please call Elink.

## 2020-08-16 NOTE — Progress Notes (Signed)
[place] for heparin Indication: atrial fibrillation  Allergies  Allergen Reactions  . Metformin And Related Other (See Comments)    Has kidney problems    Patient Measurements: Height: 6' (182.9 cm) (from previous records) Weight: 65.2 kg (143 lb 11.8 oz) IBW/kg (Calculated) : 77.6  Vital Signs: Temp: 97.4 F (36.3 C) (03/30 0002) Temp Source: Oral (03/30 0002) BP: 126/91 (03/29 2210) Pulse Rate: 110 (03/29 2210)  Labs: Recent Labs    08/14/20 0432 08/14/20 1620 08/15/20 0244 08/15/20 1326 08/16/20 0110  HGB 8.2*  --  8.3*  --  8.5*  HCT 26.2*  --  26.6*  --  27.7*  PLT 157  --  182  --  116*  APTT  --  42*  --   --   --   HEPARINUNFRC 0.27* 0.18* 0.11* <0.10* <0.10*  CREATININE 4.46*  --  4.99*  --  3.56*    Estimated Creatinine Clearance: 18.8 mL/min (A) (by C-G formula based on SCr of 3.56 mg/dL (H)).  Assessment: 65 y.o. male with Afib for heparin  Goal of Therapy:  Heparin level 0.3-0.7 units/ml Monitor platelets by anticoagulation protocol: Yes   Plan:  Increase Heparin 1500 units/hr Check heparin level in 8 hours.  Phillis Knack, PharmD, BCPS  08/16/2020 2:57 AM

## 2020-08-16 NOTE — Progress Notes (Signed)
PROGRESS NOTE    Paul [name]  IOX:[id number] DOB: 05-05-1956 DOA: 07/23/2020 PCP: [name], MD    Brief Narrative:  Mr. Wieneke was admitted to the hospital with undifferentiated shock, in the setting of large left pleural effusion.  65 year old male past medical history for end-stage renal disease on hemodialysis, failed renal transplant currently on immunosuppression, systolic heart failure, atrial fibrillation, duodenal ulcer status post Whipple 2002, osteomyelitis, peripheral vascular disease status post bilateral BKA, history of Covid infection 05/2020, who presented with dyspnea.    He is a resident from a local nursing home, unfortunately he had missed 2 hemodialysis sessions due to inability to access right upper extremity fistula (suspected clotts).  In the emergency department patient was hypotensive with blood pressure 75/53, heart rate 127, temperature 37.5, respiratory rate 23, oxygen saturation 100%, his lungs had diminished breath sounds on the left side, increased work of breathing, no wheezing, heart S1-S2, present, rhythmic, abdomen was soft and nontender, positive bilateral BKA.  No rashes.  Sodium 139, potassium 4.8, chloride 102, bicarb 23, glucose 235, BUN 76, creatinine 6.79, AST 14, ALT 8, lactic acid 2.2, white count 6.5, hemoglobin 7.9, hematocrit 25.9, platelets 168. SARS COVID-19 negative.  Chest radiograph with large left pleural effusion.  EKG 130 bpm, normal axis, prolonged QTC 512, atrial fibrillation rhythm, no ST segment changes, Q-wave V1-V2, T wave inversions lead I, aVL, V4-V6,  Patient was placed to the intensive care unit, he was placed on vasopressors and underwent left-sided pigtail catheter placement. Initially received broad-spectrum IV antibiotic therapy  A temporary hemodialysis catheter was placed left IJ, and underwent hemodialysis.  Transferred to [facility] 3/29.  Chest tube to suction, sp tpa and dornase, positive loculation.   Still not able to use AV fistula, and using catheter for dialysis.   Assessment & Plan:   Principal Problem:   Hypotension Active Problems:   ESRD on hemodialysis (HCC)   DM (diabetes mellitus) ([place])   Atrial fibrillation (HCC)   Depression   S/P BKA (below knee amputation) bilateral (HCC)   Immunosuppression due to drug therapy ([place])   History of Whipple procedure   Heart failure with reduced ejection fraction ([place])    1. Shock undifferentiated, rule out for septic shock. Possible related to large left pleural effusion/ or vasoplegia.  Stable blood pressure, today is 165/90 mmHg.   Discontinue midodrine and continue close blood pressure monitoring.   2. Large left loculated exudative pleural effusion/ acute hypoxemic respiratory failure. Chest tube continue to suction 20 cm with no presence of air leak today.     SP tpa and dornase, documented out put is 1,180 ml, follow up chest film personally reviewed with right pleural effusion, small effusion on the left, with chest tube in place and no pneumothorax.  Continue tube to suction, need to drain less than 100 cc in order to remove chest tube. Continue ultrafiltration with hemodialysis.   3. ERSD on HD/ failed renal transplant. Has a temporary catheter at the left IJ. Yesterday not able to cannulate AV fistula.   Continue with immunosuppressive therapy (prednisone and tacrolimus). Per nephrology attempt to use fistula in am, if still not functional may need further investigation with radiology.    4. Chronic diastolic heart failure/ atrial fibrillation. Continue rate control with metoprolol.   Anticoagulation with heparin, until more stable HD access.   5. Severe protein calorie malnutrition/ stage 2 pressure ulcer at the sacrum (present on admission). Nutritional supplements. Patient very weak and deconditioned SNF resident.  Continue with skin care per protocol.   6. PVD. Sp BKA bilaterally. Poor mobility.    7. T2DM. fasting glucose is 217, will continue close monitoring and coverage with insulin sliding scale.  8. Depression,. On sertraline.    Patient continue to be at high risk for worsening left pleural effusion.   Status is: Inpatient  Remains inpatient appropriate because:IV treatments appropriate due to intensity of illness or inability to take PO   Dispo: The patient is from: SNF              Anticipated d/c is to: SNF              Patient currently is not medically stable to d/c.   Difficult to place patient No   DVT prophylaxis: IV heparin   Code Status:   full Family Communication:  No family at the bedside      Nutrition Status: Nutrition Problem: Increased nutrient needs Etiology: chronic illness (ESRD on HD) Signs/Symptoms: estimated needs Interventions: Nepro shake,MVI     Skin Documentation: Pressure Injury 01/18/20 Sacrum Stage 2 -  Partial thickness loss of dermis presenting as a shallow open injury with a red, pink wound bed without slough. (Active)  01/18/20 2251  Location: Sacrum  Location Orientation:   Staging: Stage 2 -  Partial thickness loss of dermis presenting as a shallow open injury with a red, pink wound bed without slough.  Wound Description (Comments):   Present on Admission: Yes     Consultants:   Nephrology   Pulmonary   Procedures:  Chest tube left HD catheter  Subjective: Patient is feeling very weak and deconditioned, positive pain at the site of the chest tube, no dyspnea. No nausea or vomiting.   Objective: Vitals:   08/16/20 0800 08/16/20 1000 08/16/20 1116 08/16/20 1200  BP: 133/82 121/82  (!) 165/90  Pulse: 87 75  (!) 105  Resp: 20 20  (!) 23  Temp:   97.6 F (36.4 C)   TempSrc:   Axillary   SpO2: 100% 100%  100%  Weight:      Height:        Intake/Output Summary (Last 24 hours) at 08/16/2020 1314 Last data filed at 08/16/2020 1100 Gross per 24 hour  Intake 630.84 ml  Output 1455 ml  Net -824.16 ml    Filed Weights   08/15/20 1300 08/15/20 1641 08/16/20 0500  Weight: 65.4 kg 65.2 kg 63.9 kg    Examination:   General: Not in pain or dyspnea, deconditioned  Neurology: Awake and alert, non focal  E ENT: positive poallor, no icterus, oral mucosa moist Cardiovascular: No JVD. S1-S2 present, rhythmic, no gallops, rubs, or murmurs.  Pulmonary: positive breath sounds bilaterally, adequate air movement, no wheezing, rhonchi or rales. Gastrointestinal. Abdomen soft and non tender Skin. No rashes Musculoskeletal: bilateral BKS Left chest tube in place.     Data Reviewed: I have personally reviewed following labs and imaging studies  CBC: Recent Labs  Lab 08/09/2020 1328 08/15/2020 1336 08/12/20 0259 08/13/20 0427 08/14/20 0432 08/15/20 0244 08/16/20 0110  WBC 6.5   < > 7.7 6.7 8.4 8.9 8.3  NEUTROABS 5.5  --   --   --   --   --   --   HGB 7.9*   < > 7.8* 7.8* 8.2* 8.3* 8.5*  HCT 25.9*   < > 25.8* 26.0* 26.2* 26.6* 27.7*  MCV 98.5   < > 98.5 96.7 96.0 96.7 97.5  PLT 168   < >  184 146* 157 182 116*   < > = values in this interval not displayed.   Basic Metabolic Panel: Recent Labs  Lab 08/12/20 0259 08/13/20 0427 08/14/20 0432 08/15/20 0244 08/16/20 0110  NA 139 135 135 135 134*  K 5.0 3.5 3.9 3.9 3.5  CL 100 99 101 100 99  CO2 29 28 26 26 26   GLUCOSE 129* 106* 124* 198* 217*  BUN 79* 34* 48* 65* 44*  CREATININE 6.63* 3.65* 4.46* 4.99* 3.56*  CALCIUM 8.0* 7.7* 8.1* 8.1* 7.7*  MG 2.1  --   --   --   --   PHOS 7.0*  --   --   --   --    GFR: Estimated Creatinine Clearance: 18.4 mL/min (A) (by C-G formula based on SCr of 3.56 mg/dL (H)). Liver Function Tests: Recent Labs  Lab 08/08/2020 1328  AST 14*  ALT 8  ALKPHOS 106  BILITOT 1.1  PROT 4.8*  ALBUMIN 1.7*   No results for input(s): LIPASE, AMYLASE in the last 168 hours. No results for input(s): AMMONIA in the last 168 hours. Coagulation Profile: No results for input(s): INR, PROTIME in the last 168  hours. Cardiac Enzymes: No results for input(s): CKTOTAL, CKMB, CKMBINDEX, TROPONINI in the last 168 hours. BNP (last 3 results) No results for input(s): PROBNP in the last 8760 hours. HbA1C: No results for input(s): HGBA1C in the last 72 hours. CBG: Recent Labs  Lab 08/15/20 1930 08/15/20 2329 08/16/20 0335 08/16/20 0745 08/16/20 1115  GLUCAP 193* 257* 217* 141* 138*   Lipid Profile: No results for input(s): CHOL, HDL, LDLCALC, TRIG, CHOLHDL, LDLDIRECT in the last 72 hours. Thyroid Function Tests: No results for input(s): TSH, T4TOTAL, FREET4, T3FREE, THYROIDAB in the last 72 hours. Anemia Panel: No results for input(s): VITAMINB12, FOLATE, FERRITIN, TIBC, IRON, RETICCTPCT in the last 72 hours.    Radiology Studies: I have reviewed all of the imaging during this hospital visit personally     Scheduled Meds: . Chlorhexidine Gluconate Cloth  6 each Topical Q0600  . feeding supplement (NEPRO CARB STEADY)  237 mL Oral TID BM  . insulin aspart  0-9 Units Subcutaneous Q4H  . lipase/protease/amylase  12,000 Units Oral TID AC  . metoprolol tartrate  25 mg Oral BID  . midodrine  10 mg Oral TID WC  . multivitamin  1 tablet Oral QHS  . pantoprazole  40 mg Oral Daily  . predniSONE  5 mg Oral Q breakfast  . sertraline  50 mg Oral QHS  . sodium chloride flush  10 mL Intracatheter Q8H  . sodium chloride flush  10-40 mL Intracatheter Q12H  . tacrolimus  2 mg Oral BID   Continuous Infusions: . heparin 1,700 Units/hr (08/16/20 1200)     LOS: 5 days        Wilkin Lippy Gerome Apley, MD

## 2020-08-17 ENCOUNTER — Inpatient Hospital Stay (HOSPITAL_COMMUNITY): Payer: Medicare Other

## 2020-08-17 DIAGNOSIS — N186 End stage renal disease: Secondary | ICD-10-CM | POA: Diagnosis not present

## 2020-08-17 DIAGNOSIS — L8990 Pressure ulcer of unspecified site, unspecified stage: Secondary | ICD-10-CM | POA: Insufficient documentation

## 2020-08-17 DIAGNOSIS — I480 Paroxysmal atrial fibrillation: Secondary | ICD-10-CM | POA: Diagnosis not present

## 2020-08-17 DIAGNOSIS — E162 Hypoglycemia, unspecified: Secondary | ICD-10-CM

## 2020-08-17 DIAGNOSIS — F32A Depression, unspecified: Secondary | ICD-10-CM | POA: Diagnosis not present

## 2020-08-17 DIAGNOSIS — I9589 Other hypotension: Secondary | ICD-10-CM | POA: Diagnosis not present

## 2020-08-17 DIAGNOSIS — Z9689 Presence of other specified functional implants: Secondary | ICD-10-CM | POA: Diagnosis not present

## 2020-08-17 LAB — BASIC METABOLIC PANEL
Anion gap: 11 (ref 5–15)
BUN: 62 mg/dL — ABNORMAL HIGH (ref 8–23)
CO2: 23 mmol/L (ref 22–32)
Calcium: 7.9 mg/dL — ABNORMAL LOW (ref 8.9–10.3)
Chloride: 101 mmol/L (ref 98–111)
Creatinine, Ser: 4.13 mg/dL — ABNORMAL HIGH (ref 0.61–1.24)
GFR, Estimated: 15 mL/min — ABNORMAL LOW (ref >60)
Glucose, Bld: 114 mg/dL — ABNORMAL HIGH (ref 70–99)
Potassium: 3.4 mmol/L — ABNORMAL LOW (ref 3.5–5.1)
Sodium: 135 mmol/L (ref 135–145)

## 2020-08-17 LAB — CBC
HCT: 29.7 % — ABNORMAL LOW (ref 39.0–52.0)
Hemoglobin: 9 g/dL — ABNORMAL LOW (ref 13.0–17.0)
MCH: 30.1 pg (ref 26.0–34.0)
MCHC: 30.3 g/dL (ref 30.0–36.0)
MCV: 99.3 fL (ref 80.0–100.0)
Platelets: 141 K/uL — ABNORMAL LOW (ref 150–400)
RBC: 2.99 MIL/uL — ABNORMAL LOW (ref 4.22–5.81)
RDW: 17.1 % — ABNORMAL HIGH (ref 11.5–15.5)
WBC: 10.3 K/uL (ref 4.0–10.5)
nRBC: 0 % (ref 0.0–0.2)

## 2020-08-17 LAB — GLUCOSE, CAPILLARY
Glucose-Capillary: 123 mg/dL — ABNORMAL HIGH (ref 70–99)
Glucose-Capillary: 69 mg/dL — ABNORMAL LOW (ref 70–99)
Glucose-Capillary: 98 mg/dL (ref 70–99)
Glucose-Capillary: 70 mg/dL (ref 70–99)
Glucose-Capillary: 61 mg/dL — ABNORMAL LOW (ref 70–99)
Glucose-Capillary: 96 mg/dL (ref 70–99)

## 2020-08-17 LAB — HEPARIN LEVEL (UNFRACTIONATED): Heparin Unfractionated: 0.39 [IU]/mL (ref 0.30–0.70)

## 2020-08-17 MED ORDER — DEXTROSE 50 % IV SOLN
12.5000 g | INTRAVENOUS | Status: DC | PRN
  Administered 2020-08-17: 12.5 g via INTRAVENOUS
  Filled 2020-08-17: qty 50

## 2020-08-17 MED ORDER — DEXTROSE 50 % IV SOLN
1.0000 | Freq: Once | INTRAVENOUS | Status: AC
  Administered 2020-08-17: 50 mL via INTRAVENOUS
  Filled 2020-08-17: qty 50

## 2020-08-17 NOTE — Progress Notes (Signed)
NAME:  Paul [name], MRN:  [id number], DOB:  [birth year], LOS: 6 ADMISSION DATE:  08/14/2020, CONSULTATION DATE:  07/31/2020 REFERRING MD:  Ron Parker- EM, CHIEF COMPLAINT:  SOB  History of Present Illness:  65 yo M extensive PMH including ESRD s/p failed renal transplant - on THSa iHD, chronic immunosuppression, systolic heart failure, Afib on eliquis, duodenal cancer s/p whipple (2002), osteomyelitis and critical limb ischemia s/p Bilat BKAs, COVID, recent enterococcus bacteremia requiring hospitalization, who presented to ED 3/25 from [place] home with SOB. SOB has increased over the past week -- associated with missing HD x3 due to inability to access the patient's RUE AVF.  Pt hypotensive in ED with SBPs 70s-- received small IVF bolus and started on levo. CXR revealed large L pleural effusion. CMP obtained which did not reveal any absolute indications for emergent HD. CBC with WBC 6.5, hgb 7.9, plt 168. LA 2.2   Critical care consulted for admission  Pertinent  Medical History  Afib on eliquis Duodenal cancer s/p whipple ESRD Failed renal transplant Chronic immunosuppression  DM Gangrene Osteomyelitis Critical limb ischemia calciphylaxis S/p bilateral BKAs  HTN  COVID Enterococcus bacteremia  Combined systolic and diastolic heart failure, EF 25-30% Pleural effusion  IDDM  Significant Hospital Events: Including procedures, antibiotic start and stop dates in addition to other pertinent events   . 3/25 presented to ED from nursing home with SOB after multiple missed HD runs due to inability to access fistula. Hypotensive in ED, started on pressors. CXR with large L pleural effusion. Blood cx sent. Started on empiric abx for possible sepsis. Admitting to ICU, temp HD and left pigtail placed; lymphocytic predominant fluid . 3/26: HD . 3/26 TTE >LVEF 60-65%, mild LVH, RV normal, LA dilated, RA dilated, small pericardial effusion . 3/28 CXR >> No improvement in pleural  effusion . 3/28: tPA and dornase . 3/29: tPA and dornase . 3/30: tPA and dornase . 3/31 CXR > 80cc out during day shift, minimal change in CXR  Interim History / Subjective:  Had HD today Confused on my exam 1200 cc fluid out of chest tube 3/30 7 am to 3/31 7 am 80cc out of chest tube since this morning  Objective   Blood pressure 105/79, pulse 95, temperature (!) 97.3 F (36.3 C), temperature source Oral, resp. rate (!) 22, height 6' (1.829 m), weight 64.4 kg, SpO2 98 %.        Intake/Output Summary (Last 24 hours) at 08/17/2020 1721 Last data filed at 08/17/2020 1400 Gross per 24 hour  Intake 376.97 ml  Output 379 ml  Net -2.03 ml   Filed Weights   08/17/20 0215 08/17/20 0738 08/17/20 1111  Weight: 63.8 kg 63.3 kg 64.4 kg    Examination: Constitutional: chronically ill appearing man laying in bed in NAD HEENT: [place]/AT, eyes anicteric Neck: HD catheter in place Cardiovascular: reg rate, irreg rhythm Respiratory: still reduced left sided breath sounds, no tachypnea. Amber clear pleural fluid draining. Gastrointestinal: soft, NT, ND Extremities: no edema or cyanosis Skin: warm, dry, no rashes Neurologic: awake, answering questions appropriately, moving all extremities spontaneously   Labs/imaging that I have personally reviewed  (right click and "Reselect all SmartList Selections" daily)  Blood cx 3/25> NG Pleural fluid cx 3/25 NG  CXR> smaller lateral component to pleural effusion  3/25 pleural fluid cytology > reactive mesothelial cells  Resolved Hospital Problem list    Assessment & Plan:   Acute respiratory failure with hypoxia Bilateral pleural effusion, acute (right  effusion has developed since hospitalization) Chronic left effusion, started in 2020, pigtail 3/25 > lymphocyte predominant, but chemistries consistent with transudate.  Has his volume removal with HD been adequate? I suspect this could be part of the problem.   P: --for now keep chest tube at  suction -20 cm H20 --hold off on further tPA/Pulmozyme --question for renal: is he receiving adequate volume removal? --if he is not receiving adequate volume removal after a week of aggressive inpatient care, then should we consider palliative care involvement?   ESRD on TTS HD s/p failed renal transplant on immunosuppressants.  Poor AVF function Patient missed HD x2 due to clotted access. Access was declotted on 3/24 but clotted again 2/2 hypotension. Received temp HD cath and dialysis on 3/26. Currently euvolemic with slight worsening of kidney function but no electrolyte abnormalities.  P: --per TRH  Rest of care per primary.  Best practice (right click and "Reselect all SmartList Selections" daily)  Per primary  Roselie Awkward, MD [facility] Pager: [phone] Cell: [phone] If no response, please call [phone] until 7pm After 7:00 pm call Elink  [phone]

## 2020-08-17 NOTE — Plan of Care (Signed)
  Problem: Activity: Goal: Risk for activity intolerance will decrease Outcome: Progressing   Problem: Nutrition: Goal: Adequate nutrition will be maintained Outcome: Progressing   Problem: Coping: Goal: Level of anxiety will decrease Outcome: Progressing   Problem: Elimination: Goal: Will not experience complications related to bowel motility Outcome: Progressing Goal: Will not experience complications related to urinary retention Outcome: Progressing   

## 2020-08-17 NOTE — Progress Notes (Signed)
ANTICOAGULATION CONSULT NOTE - Follow Up Consult  Pharmacy Consult for IV Heparin Indication: atrial fibrillation  Allergies  Allergen Reactions  . Metformin And Related Other (See Comments)    Has kidney problems    Patient Measurements: Height: 6' (182.9 cm) (from previous records) Weight: 63.3 kg (139 lb 8.8 oz) IBW/kg (Calculated) : 77.6  Heparin Dosing Weight: 63.9 kg  Vital Signs: Temp: 97.5 F (36.4 C) (03/31 0738) Temp Source: Oral (03/31 0738) BP: 107/76 (03/31 0741) Pulse Rate: 60 (03/31 0741)  Labs: Recent Labs    08/14/20 1620 08/14/20 1620 08/15/20 0244 08/15/20 1326 08/16/20 0110 08/16/20 1021 08/16/20 2026 08/17/20 0326  HGB  --    < > 8.3*  --  8.5*  --   --  9.0*  HCT  --   --  26.6*  --  27.7*  --   --  29.7*  PLT  --   --  182  --  116*  --   --  141*  APTT 42*  --   --   --   --   --   --   --   HEPARINUNFRC 0.18*  --  0.11*   < > <0.10* 0.12* 0.34 0.39  CREATININE  --   --  4.99*  --  3.56*  --   --  4.13*   < > = values in this interval not displayed.    Estimated Creatinine Clearance: 15.8 mL/min (A) (by C-G formula based on SCr of 4.13 mg/dL (H)).  Assessment: 65 y.o. male with ESRD (S/P failed renal transplant, on HD) on IV heparin for atrial fibrillation.  Heparin level this morning remains therapeutic (HL 0.39 << 0.34, goal of 0.3-0.7). Hgb/Hct stable, plts up to 141. No bleeding noted.   Goal of Therapy:  Heparin level 0.3-0.7 units/ml Monitor platelets by anticoagulation protocol: Yes   Plan:  - Continue Heparin at 1700 units/hr (17 ml/hr) - Will continue to monitor for any signs/symptoms of bleeding and will follow up with heparin level in the a.m.   Thank you for allowing pharmacy to be a part of this patient's care.  Alycia Rossetti, PharmD, BCPS Clinical Pharmacist Clinical phone for 08/17/2020: [phone] 08/17/2020 8:00 AM   **Pharmacist phone directory can now be found on [email or web address] ([email or web address]).  Listed under [place].

## 2020-08-17 NOTE — Progress Notes (Signed)
[facility] ROUNDING NOTE   Subjective:   Brief history: 65 year old end-stage renal disease status post failed transplant.  History of systolic heart failure, atrial fibrillation duodenal ulcer status post Whipple's procedure 2002.  History of osteomyelitis peripheral vascular status post bilateral BKA.  Admitted with Covid pneumonia 05/2020.  Residence of a local nursing home.  Was admitted 07/19/2020 with hypotension and shock with large left loculated exudative pleural effusion and hypoxic respiratory failure.  Underwent chest tube placement.  He receives dialysis through a left IJ catheter due to clotted AV fistula.  He receives dialysis on a Tuesday Thursday Saturday schedule his dialysis is scheduled for 08/17/2020.   Blood pressure 117/94 pulse 116 temperature 97.7 O2 sats 95%  IV heparin  Prednisone 5 mg daily Zoloft 50 mg daily, Creon, Lopressor 25 mg twice daily, Protonix 40 mg daily, tacrolimus 2 mg twice daily   Objective:  Vital signs in last 24 hours:  Temp:  [96.7 F (35.9 C)-98.1 F (36.7 C)] 97.7 F (36.5 C) (03/31 0444) Pulse Rate:  [59-130] 59 (03/31 0444) Resp:  [15-24] 20 (03/31 0444) BP: (102-165)/(66-125) 102/68 (03/31 0444) SpO2:  [93 %-100 %] 94 % (03/31 0444) Weight:  [63.8 kg] 63.8 kg (03/31 0215)  Weight change: -1.6 kg Filed Weights   08/15/20 1641 08/16/20 0500 08/17/20 0215  Weight: 65.2 kg 63.9 kg 63.8 kg    Intake/Output: I/O last 3 completed shifts: In: 1649.8 [P.O.:900; I.V.:509.8; NG/GT:240] Out: 2130 [Other:300; Chest Tube:1830]   Intake/Output this shift:  Total I/O In: 105 [I.V.:105] Out: 55 [Chest Tube:590]  GEN: chronically ill appearing, lying in bed in nad ENT: no nasal discharge, mmm EYES: no scleral icterus, eomi CV: tachycardia, irregular rhythm PULM: no iwob, bilateral chest rise ABD: NABS, non-distended SKIN: no rashes or jaundice EXT: no edema, warm and well perfused RUE AVF: Thrill present, minimal  pulsatility, highly collapsable   Basic Metabolic Panel: Recent Labs  Lab 08/12/20 0259 08/13/20 0427 08/14/20 0432 08/15/20 0244 08/16/20 0110 08/17/20 0326  NA 139 135 135 135 134* 135  K 5.0 3.5 3.9 3.9 3.5 3.4*  CL 100 99 101 100 99 101  CO2 29 28 26 26 26 23   GLUCOSE 129* 106* 124* 198* 217* 114*  BUN 79* 34* 48* 65* 44* 62*  CREATININE 6.63* 3.65* 4.46* 4.99* 3.56* 4.13*  CALCIUM 8.0* 7.7* 8.1* 8.1* 7.7* 7.9*  MG 2.1  --   --   --   --   --   PHOS 7.0*  --   --   --   --   --     Liver Function Tests: Recent Labs  Lab 08/08/2020 1328  AST 14*  ALT 8  ALKPHOS 106  BILITOT 1.1  PROT 4.8*  ALBUMIN 1.7*   No results for input(s): LIPASE, AMYLASE in the last 168 hours. No results for input(s): AMMONIA in the last 168 hours.  CBC: Recent Labs  Lab 08/06/2020 1328 07/23/2020 1336 08/13/20 0427 08/14/20 0432 08/15/20 0244 08/16/20 0110 08/17/20 0326  WBC 6.5   < > 6.7 8.4 8.9 8.3 10.3  NEUTROABS 5.5  --   --   --   --   --   --   HGB 7.9*   < > 7.8* 8.2* 8.3* 8.5* 9.0*  HCT 25.9*   < > 26.0* 26.2* 26.6* 27.7* 29.7*  MCV 98.5   < > 96.7 96.0 96.7 97.5 99.3  PLT 168   < > 146* 157 182 116* 141*   < > =  values in this interval not displayed.    Cardiac Enzymes: No results for input(s): CKTOTAL, CKMB, CKMBINDEX, TROPONINI in the last 168 hours.  BNP: Invalid input(s): POCBNP  CBG: Recent Labs  Lab 08/16/20 1115 08/16/20 1523 08/16/20 1941 08/16/20 2332 08/17/20 0442  GLUCAP 138* 154* 151* 166* 103*    Microbiology: Results for orders placed or performed during the hospital encounter of 08/08/2020  Blood culture (routine single)     Status: None   Collection Time: 08/10/2020  1:28 PM   Specimen: BLOOD LEFT ARM  Result Value Ref Range Status   Specimen Description BLOOD LEFT ARM  Final   Special Requests   Final    BOTTLES DRAWN AEROBIC AND ANAEROBIC Blood Culture results may not be optimal due to an inadequate volume of blood received in culture  bottles   Culture   Final    NO GROWTH 5 DAYS Performed at [facility], [place] [address], [place], [place] [address]    Report Status 08/16/2020 FINAL  Final  MRSA PCR Screening     Status: None   Collection Time: 08/10/2020  4:08 PM   Specimen: Nasopharyngeal  Result Value Ref Range Status   MRSA by PCR NEGATIVE NEGATIVE Final    Comment:        The GeneXpert MRSA Assay (FDA approved for NASAL specimens only), is one component of a comprehensive MRSA colonization surveillance program. It is not intended to diagnose MRSA infection nor to guide or monitor treatment for MRSA infections. Performed at [facility], [place] [address] [place], [place] [address]   Body fluid culture w Gram Stain     Status: None   Collection Time: 07/23/2020  4:44 PM   Specimen: Body Fluid  Result Value Ref Range Status   Specimen Description FLUID PLEURAL LEFT  Final   Special Requests NONE  Final   Gram Stain   Final    RARE WBC PRESENT, PREDOMINANTLY MONONUCLEAR NO ORGANISMS SEEN    Culture   Final    NO GROWTH 3 DAYS Performed at [facility], [address], [place], [place] [address]    Report Status 08/15/2020 FINAL  Final  Culture, blood (routine x 2)     Status: None   Collection Time: 08/10/2020  5:57 PM   Specimen: BLOOD  Result Value Ref Range Status   Specimen Description BLOOD THUMB LEFT  Final   Special Requests   Final    BOTTLES DRAWN AEROBIC ONLY Blood Culture results may not be optimal due to an inadequate volume of blood received in culture bottles   Culture   Final    NO GROWTH 5 DAYS Performed at [facility], [place] [address] [place], [place] [address]    Report Status 08/16/2020 FINAL  Final  Culture, blood (routine x 2)     Status: None   Collection Time: 07/25/2020  6:04 PM   Specimen: BLOOD LEFT HAND  Result Value Ref Range Status   Specimen Description BLOOD LEFT HAND  Final   Special Requests   Final    BOTTLES DRAWN AEROBIC ONLY Blood Culture  results may not be optimal due to an inadequate volume of blood received in culture bottles   Culture   Final    NO GROWTH 5 DAYS Performed at [facility], [place] [address], [place], [place] [address]    Report Status 08/16/2020 FINAL  Final    Coagulation Studies: No results for  input(s): LABPROT, INR in the last 72 hours.  Urinalysis: No results for input(s): COLORURINE, LABSPEC, PHURINE, GLUCOSEU, HGBUR, BILIRUBINUR, KETONESUR, PROTEINUR, UROBILINOGEN, NITRITE, LEUKOCYTESUR in the last 72 hours.  Invalid input(s): APPERANCEUR    Imaging: DG CHEST PORT 1 VIEW  Result Date: 08/17/2020 CLINICAL DATA:  Pleural effusion. Chest tube. In stage renal disease. Atrial fibrillation. EXAM: PORTABLE CHEST 1 VIEW COMPARISON:  08/16/2020. FINDINGS: Left IJ line in stable position. Left lower chest tube has been slightly retracted and straightened. Chest tube remains over the left lower chest. Prominent skin folds noted bilaterally. Linear density over the left upper lung most likely represents a skin fold as lung markings are noted distally. To exclude developing tiny left pneumothorax, continued follow-up chest x-rays are suggested. Persistent bibasilar atelectasis/infiltrates and bilateral pleural effusions without interim change. Heart size stable. IMPRESSION: 1. Left IJ line stable position. Left lower chest tube has been slightly retracted and straightened. Chest tube remains over the left lower chest. Prominent skin folds noted bilaterally. Linear density over the left upper chest most likely represents a skin fold as lung markings are noted distally. To exclude a developing tiny left pneumothorax, continued follow-up chest x-rays are suggested. 2. Persistent bibasilar atelectasis/infiltrates and bilateral pleural effusions without interim change. Electronically Signed   By: Marcello Moores  Register   On: 08/17/2020 05:31   DG CHEST PORT 1 VIEW  Result Date: 08/16/2020 CLINICAL DATA:  Pleural  effusion.  Chest tube. EXAM: PORTABLE CHEST 1 VIEW COMPARISON:  08/15/2020. FINDINGS: Left IJ line stable position. Left lower chest tube in stable position. Heart size stable. Persistent bibasilar atelectasis/infiltrates and bilateral pleural effusions. No pneumothorax. IMPRESSION: 1. Left IJ line and left lower chest tube in stable position. No pneumothorax. 2. Persistent bibasilar atelectasis/infiltrates and bilateral pleural effusions. Chest is unchanged from prior study. Electronically Signed   By: Marcello Moores  Register   On: 08/16/2020 05:12     Medications:   . heparin 1,700 Units/hr (08/17/20 0000)   . Chlorhexidine Gluconate Cloth  6 each Topical Q0600  . feeding supplement (NEPRO CARB STEADY)  237 mL Oral TID BM  . insulin aspart  0-9 Units Subcutaneous Q4H  . lipase/protease/amylase  12,000 Units Oral TID AC  . metoprolol tartrate  25 mg Oral BID  . multivitamin  1 tablet Oral QHS  . pantoprazole  40 mg Oral Daily  . predniSONE  5 mg Oral Q breakfast  . sertraline  50 mg Oral QHS  . sodium chloride flush  10 mL Intracatheter Q8H  . sodium chloride flush  10-40 mL Intracatheter Q12H  . tacrolimus  2 mg Oral BID   docusate sodium, heparin, metoprolol tartrate, polyethylene glycol, sodium chloride flush  Assessment/ Plan:  OP HD: [facility] TTS     3.5h  400/500   66kg  3K/2.5Ca bath  AVF  Hep none  - calcitriol 1 tiw  - venofer 100 x 5 , started 3/19  - mircera 100 q2 (last dose during prior admit)   Assessment/ Plan: 1. Hypotension - suspected septic shock. Recent admission w Enterococcus bacteremia. Large left pleural effusion on CXR. SP chest tube placement w/ ^^drainage. NG on cultures at this time. Hypotension greatly improved. 2. AFib RVR --Usual meds Eliquis, metoprolol as tolerated w/ sepsis. On heparin currently 3. ESRD -HD TTS. Maintain schedule for now.  [facility] kidney center 4. RUE AVF issues: Missed HD x 2 this d/t clotted access. S/p declot at [facility] 3/24.   There was concern that his access had clotted  again but this was likely due to hypotension and highly collapsible AVF demonstrating minimal flow.  Temp cath placed and had HD Sat 3/26 afternoon. .  Attempting cannulation of AV fistula 08/17/2020.  Consider consultation with vein and vascular surgery 5. Hypotension/volume - shock resolved. Appears euvolemic 6. Anemia - Continue outpatient ESA. Holding iron given recent infection 7. Metabolic bone disease -Continue Auryixa binders/VDRA 8. T1DM - Insulin per primary    LOS: [place] @TODAY @6 :58 AM

## 2020-08-17 NOTE — NC FL2 (Signed)
[place] LEVEL OF CARE SCREENING TOOL     IDENTIFICATION  Patient Name: Paul [name] Birthdate: Feb 28, 1956 Sex: male Admission Date (Current Location): 08/12/2020  [facility] and [place] Number:  Herbalist and Address:  The [place], [place] [address], [place], [place] [address]      Provider Number: [id number]  Attending Physician Name and Address:  Tawni Millers,*  Relative Name and Phone Number:  Paul, [name] (Spouse)   [phone]    Current Level of Care: Hospital Recommended Level of Care: [place] Prior Approval Number:    Date Approved/Denied:   PASRR Number: [id number]  Discharge Plan: SNF    Current Diagnoses: Patient Active Problem List   Diagnosis Date Noted  . Immunosuppression due to drug therapy ([place]) 07/28/2020  . History of Whipple procedure 07/20/2020  . Heart failure with reduced ejection fraction ([place]) 08/09/2020  . PAF (paroxysmal atrial fibrillation) ([place]) 06/11/2020  . Chronic combined systolic and diastolic CHF (congestive heart failure) ([place]) 06/11/2020  . Labile blood glucose   . Diabetic peripheral neuropathy ([place])   . Anemia of chronic disease   . Right below-knee amputee ([place]) 01/18/2020  . Amputation stump infection ([place])   . Subacute osteomyelitis of right tibia ([place])   . Dehiscence of amputation stump ([place])   . Sepsis ([place]) 01/04/2020  . Cellulitis 12/31/2019  . CKD stage 4 due to type 2 diabetes mellitus ([place]) 04/29/2019  . AKI (acute kidney injury) ([place]) 04/24/2019  . Hyperglycemia 04/24/2019  . Nausea and vomiting 04/24/2019  . Fever 04/23/2019  . Acute metabolic encephalopathy 92/03/9416  . Stroke ([place]) 10/28/2018  . Middle cerebral artery embolism, left 10/28/2018  . Metabolic acidosis 40/81/4481  . Dehydration 05/24/2018  . Stage II pressure ulcer ([place]) 05/24/2018  . Dyspnea 05/23/2018  . Acute respiratory failure with hypoxia ([place]) 05/23/2018  .  Abnormal cardiovascular stress test 12/29/2013  . Awaiting organ transplant 12/09/2013  . Leg ulcer ([place]) 11/17/2013  . Chronic kidney disease (CKD), stage V ([place]) 11/17/2013  . Diabetes ([place]) 11/17/2013  . Cancer of duodenum ([place]inn[place]) 11/17/2013  . Dyslipidemia 11/17/2013  . Chronic kidney disease requiring chronic dialysis ([name]) 11/17/2013  . Hypercholesteremia 11/17/2013  . H/O malignant neoplasm 11/17/2013  . H/O: HTN (hypertension) 11/17/2013  . BP (high blood pressure) 11/17/2013  . Hypotension 11/17/2013  . Angiopathy, peripheral ([place]) 11/17/2013  . AF (paroxysmal atrial fibrillation) ([place]) 11/17/2013  . BKA stump complication ([place]) 85/63/1497  . S/P bilateral BKA (below knee amputation) (Rosa) 08/03/2013  . S/P BKA (below knee amputation) bilateral ([place]) 07/30/2013  . Open wound of both legs with complication 02/63/7858  . Acute blood loss anemia 07/12/2013  . Syncope 07/09/2013  . Intolerance to CAPD peritoneal dialysis s/p CAPD cath removal 07/09/2013 07/05/2013  . Critical lower limb ischemia (Fall[place]) 06/30/2013  . Extremity pain 06/30/2013  . Atherosclerosis of native arteries of the extremities with ulceration(440.23) 06/17/2013  . Gait disorder 05/31/2013  . Weakness 05/24/2013  . Depression 04/03/2013  . GERD (gastroesophageal reflux disease) 04/03/2013  . Atrial fibrillation (Al[place]) 04/02/2013  . DM (diabetes mellitus) ([place]) 12/28/2012  . Other complications due to renal dialysis device, implant, and graft 05/19/2012  . Diabetes mellitus, type 2 ([place]ham) 11/13/2011  . Anemia 07/02/2011  . ESRD on hemodialysis ([place]) 05/29/2011    Orientation RESPIRATION BLADDER Height & Weight     Self,Time,Situation,Place  O2 (3L Nasal Cannula) Continent Weight: 141 lb 15.6 oz (64.4 kg) Height:  6' (  182.9 cm) (from previous records)  BEHAVIORAL SYMPTOMS/MOOD NEUROLOGICAL BOWEL NUTRITION STATUS      Incontinent Diet (See DC Summary)  AMBULATORY STATUS COMMUNICATION OF NEEDS Skin    Extensive Assist Verbally Wound Vac (Skin tear anterior Arm lower R Dry)                       Personal Care Assistance Level of Assistance  Bathing,Feeding,Dressing Bathing Assistance: Maximum assistance Feeding assistance: Independent Dressing Assistance: Maximum assistance     Functional Limitations Info  Sight,Hearing,Speech Sight Info: Adequate Hearing Info: Adequate Speech Info: Adequate    SPECIAL CARE FACTORS FREQUENCY  PT (By licensed PT),OT (By licensed OT)     PT Frequency: 5x a week OT Frequency: 5x a week            Contractures Contractures Info: Not present    Additional Factors Info  Code Status,Allergies Code Status Info: DNR Allergies Info: Metformin And Related-Has kidney problems           Current Medications (08/17/2020):  This is the current hospital active medication list Current Facility-Administered Medications  Medication Dose Route Frequency Provider Last Rate Last Admin  . Chlorhexidine Gluconate Cloth 2 % PADS 6 each  6 each Topical Q0600 Roney Jaffe, MD   6 each at 08/17/20 0720  . docusate sodium (COLACE) capsule 100 mg  100 mg Oral BID PRN Icard, Bradley L, DO      . feeding supplement (NEPRO CARB STEADY) liquid 237 mL  237 mL Oral TID BM Freddi Starr, MD   237 mL at 08/16/20 2047  . heparin ADULT infusion 100 units/mL (25000 units/215mL)  1,700 Units/hr Intravenous Continuous Priscella Mann, RPH 17 mL/hr at 08/17/20 0834 1,700 Units/hr at 08/17/20 0834  . heparin injection 1,000-6,000 Units  1,000-6,000 Units CRRT PRN IcardLeory Plowman L, DO   2,400 Units at 08/12/20 2219  . lipase/protease/amylase (CREON) capsule 12,000 Units  12,000 Units Oral TID AC Bowser, Laurel Dimmer, NP   12,000 Units at 08/16/20 1800  . metoprolol tartrate (LOPRESSOR) injection 5 mg  5 mg Intravenous Q4H PRN Arrien, Jimmy Picket, MD   2.5 mg at 08/16/20 1200  . metoprolol tartrate (LOPRESSOR) tablet 25 mg  25 mg Oral BID Candee Furbish, MD   25 mg  at 08/16/20 2149  . multivitamin (RENA-VIT) tablet 1 tablet  1 tablet Oral QHS Freddi Starr, MD   1 tablet at 08/16/20 2149  . pantoprazole (PROTONIX) EC tablet 40 mg  40 mg Oral Daily Noemi Chapel P, DO   40 mg at 08/16/20 2149  . polyethylene glycol (MIRALAX / GLYCOLAX) packet 17 g  17 g Oral Daily PRN Icard, Bradley L, DO      . predniSONE (DELTASONE) tablet 5 mg  5 mg Oral Q breakfast Candee Furbish, MD   5 mg at 08/16/20 0900  . sertraline (ZOLOFT) tablet 50 mg  50 mg Oral QHS Candee Furbish, MD   50 mg at 08/16/20 2148  . sodium chloride flush (NS) 0.9 % injection 10 mL  10 mL Intracatheter Q8H Freddi Starr, MD   10 mL at 08/17/20 0500  . sodium chloride flush (NS) 0.9 % injection 10-40 mL  10-40 mL Intracatheter Q12H Icard, Bradley L, DO   10 mL at 08/17/20 1236  . sodium chloride flush (NS) 0.9 % injection 10-40 mL  10-40 mL Intracatheter PRN Icard, Bradley L, DO      . tacrolimus (  PROGRAF) capsule 2 mg  2 mg Oral BID Candee Furbish, MD   2 mg at 08/16/20 2149     Discharge Medications: Please see discharge summary for a list of discharge medications.  Relevant Imaging Results:  Relevant Lab Results:   Additional Information SSN [id number]; ESRD TTS at [place], [place]

## 2020-08-17 NOTE — Progress Notes (Signed)
PROGRESS NOTE    Paul [name]  IPJ:[id number] DOB: [birth year] DOA: 08/16/2020 PCP: [name], MD    Brief Narrative:  [name] admitted to the hospital with undifferentiated shock, in the setting of large left pleural effusion.  65 year old male past medical history for end-stage renal disease on hemodialysis, failed renal transplant currently on immunosuppression, systolic heart failure, atrial fibrillation, duodenal ulcer status post Whipple 2002, osteomyelitis, peripheral vascular disease status post bilateral BKA, history of Covid infection01/2022,who presented with dyspnea.   He is a resident from a local nursing home, unfortunately he had missed2hemodialysis sessions due to inability to access right upper extremity fistula (suspected clotts). In the emergency department patient was hypotensive with blood pressure 75/53, heart rate 127, temperature 37.5, respiratory rate 23, oxygen saturation 100%, his lungs had diminished breath sounds on the left side, increased work of breathing, no wheezing, heart S1-S2, present, rhythmic, abdomen was soft and nontender, positive bilateral BKA. No rashes.  Sodium 139, potassium 4.8, chloride 102, bicarb 23, glucose 235, BUN 76, creatinine 6.79, AST 14, ALT 8, lactic acid 2.2, white count 6.5, hemoglobin 7.9, hematocrit 25.9, platelets 168. SARS COVID-19 negative.  Chest radiograph withlarge left pleural effusion.  EKG 130 bpm, normal axis, prolonged QTC 512, atrial fibrillation rhythm, no ST segment changes, Q-wave V1-V2, T wave inversions lead I, aVL, V4-V6,  Patient was placed to the intensive care unit, he was placed on vasopressors and underwent left-sided pigtail catheter placement. Initially received broad-spectrum IV antibiotic therapy  A temporary hemodialysis catheter was placed left IJ,and underwent hemodialysis.  Transferred to [facility] 3/29.  Loculated pleural effusion, sp TPA and dornase x 3, continue chest  tube to suction (-20 cm H20) with large output.   Still not able to use AV fistula, and using catheter for dialysis.    Assessment & Plan:   Principal Problem:   Hypotension Active Problems:   ESRD on hemodialysis (HCC)   DM (diabetes mellitus) ([place])   Atrial fibrillation (HCC)   Depression   S/P BKA (below knee amputation) bilateral (HCC)   Immunosuppression due to drug therapy ([place])   History of Whipple procedure   Heart failure with reduced ejection fraction ([place])    1. Shock undifferentiated, likely related to large left pleural effusion/ or vasoplegia. (ruled out septic shock) Midodrine has been discontinued with good toleration, blood pressure systolic 734 mmHg.   Continue left chest tube to large pleural effusion. Continue telemetry monitoring.  Continue to hold on midodrine for now.   2. Large left loculated exudative pleural effusion/ acute hypoxemic respiratory failure. Positive right pleural effusion.  Chest tube to suction (-20 cm H20) with no air leak, continue to have high out put 1,240 ml, now SP tpa and dornase x3.  Follow up chest film today with bilateral pleural effusions more right than left, chest tube in place with no pneumothorax.   On ultrafiltration with hemodialysis.   Output needs to be less than 100 ml per day in order to remove chest tube.   3. ERSD on HD/ failed renal transplant. Has a temporary catheter at the left IJ.   Not able to use right upper extremity fistula, continue with HD per nephrology recommendations.  Possible vascular surgery consultation to assess permanent HD access.  Continue with immunosuppressive therapy (prednisone and tacrolimus).  4. Chronic diastolic heart failure/ atrial fibrillation. Rate control with metoprolol, and continue anticoagulation with heparin, until more stable HD access in place.   5. Severe protein calorie malnutrition/ stage 2 pressure  ulcer at the sacrum (present on admission). Continue with  nutritional supplements.   Continue with skin care per protocol.   6. PVD. Sp BKA bilaterally. Poor very poor mobility.   7. T2DM hypoglycemia.  Post HD capillary glucose is 69, add amp of D50 IV, follow up 98. Discontinue sliding scale insulin to prevent recurrent hypoglycemia.   8. Depression, Continue with sertraline.    Patient continue to be at high risk for worsening pleural effusions.   Status is: Inpatient  Remains inpatient appropriate because:IV treatments appropriate due to intensity of illness or inability to take PO   Dispo: The patient is from: SNF              Anticipated d/c is to: SNF              Patient currently is not medically stable to d/c.   Difficult to place patient No   DVT prophylaxis:  heparin   Code Status:   dnr   Family Communication:  No family at the bedside      Nutrition Status: Nutrition Problem: Increased nutrient needs Etiology: chronic illness (ESRD on HD) Signs/Symptoms: estimated needs Interventions: Nepro shake,MVI     Consultants:   Nephrology   Pulmonary   Procedures:   Left chest tube and left IJ HD cathter.      Subjective: Patient somnolent post HD, easy to arouse, glucose has been low,. Patient with low po intake, very weak and deconditioned, no apparent pain or dyspnea,   Objective: Vitals:   08/17/20 1030 08/17/20 1100 08/17/20 1111 08/17/20 1153  BP: 92/67 (!) 88/59 (!) 128/49 105/79  Pulse: 73 (!) (P) 103 (!) 112 100  Resp:   18 (!) 22  Temp:   (!) 97.3 F (36.3 C) (!) 97.3 F (36.3 C)  TempSrc:   Axillary Oral  SpO2:   97% 98%  Weight:   64.4 kg   Height:        Intake/Output Summary (Last 24 hours) at 08/17/2020 1248 Last data filed at 08/17/2020 1238 Gross per 24 hour  Intake 534.04 ml  Output 379 ml  Net 155.04 ml   Filed Weights   08/17/20 0215 08/17/20 0738 08/17/20 1111  Weight: 63.8 kg 63.3 kg 64.4 kg    Examination:   General: Not in pain or dyspnea,. Deconditioned  and ill looking appearing  Neurology: somnolent to my examination (post HD), but easy to arouse.  E ENT: mild pallor, no icterus, oral mucosa moist Cardiovascular: No JVD. S1-S2 present, rhythmic, no gallops, rubs, or murmurs. No lower extremity edema. Pulmonary: positive breath sounds bilaterally, with no wheezing, decreased breath sounds bilateral at bases.  Gastrointestinal. Abdomen soft and non tender Skin. No rashes Musculoskeletal: bilateral BKA.      Data Reviewed: I have personally reviewed following labs and imaging studies  CBC: Recent Labs  Lab 08/08/2020 1328 08/17/2020 1336 08/13/20 0427 08/14/20 0432 08/15/20 0244 08/16/20 0110 08/17/20 0326  WBC 6.5   < > 6.7 8.4 8.9 8.3 10.3  NEUTROABS 5.5  --   --   --   --   --   --   HGB 7.9*   < > 7.8* 8.2* 8.3* 8.5* 9.0*  HCT 25.9*   < > 26.0* 26.2* 26.6* 27.7* 29.7*  MCV 98.5   < > 96.7 96.0 96.7 97.5 99.3  PLT 168   < > 146* 157 182 116* 141*   < > = values in this interval not displayed.  Basic Metabolic Panel: Recent Labs  Lab 08/12/20 0259 08/13/20 0427 08/14/20 0432 08/15/20 0244 08/16/20 0110 08/17/20 0326  NA 139 135 135 135 134* 135  K 5.0 3.5 3.9 3.9 3.5 3.4*  CL 100 99 101 100 99 101  CO2 29 28 26 26 26 23   GLUCOSE 129* 106* 124* 198* 217* 114*  BUN 79* 34* 48* 65* 44* 62*  CREATININE 6.63* 3.65* 4.46* 4.99* 3.56* 4.13*  CALCIUM 8.0* 7.7* 8.1* 8.1* 7.7* 7.9*  MG 2.1  --   --   --   --   --   PHOS 7.0*  --   --   --   --   --    GFR: Estimated Creatinine Clearance: 16 mL/min (A) (by C-G formula based on SCr of 4.13 mg/dL (H)). Liver Function Tests: Recent Labs  Lab 08/07/2020 1328  AST 14*  ALT 8  ALKPHOS 106  BILITOT 1.1  PROT 4.8*  ALBUMIN 1.7*   No results for input(s): LIPASE, AMYLASE in the last 168 hours. No results for input(s): AMMONIA in the last 168 hours. Coagulation Profile: No results for input(s): INR, PROTIME in the last 168 hours. Cardiac Enzymes: No results for input(s):  CKTOTAL, CKMB, CKMBINDEX, TROPONINI in the last 168 hours. BNP (last 3 results) No results for input(s): PROBNP in the last 8760 hours. HbA1C: No results for input(s): HGBA1C in the last 72 hours. CBG: Recent Labs  Lab 08/16/20 1523 08/16/20 1941 08/16/20 2332 08/17/20 0442 08/17/20 1212  GLUCAP 154* 151* 166* 123* 69*   Lipid Profile: No results for input(s): CHOL, HDL, LDLCALC, TRIG, CHOLHDL, LDLDIRECT in the last 72 hours. Thyroid Function Tests: No results for input(s): TSH, T4TOTAL, FREET4, T3FREE, THYROIDAB in the last 72 hours. Anemia Panel: No results for input(s): VITAMINB12, FOLATE, FERRITIN, TIBC, IRON, RETICCTPCT in the last 72 hours.    Radiology Studies: I have reviewed all of the imaging during this hospital visit personally     Scheduled Meds: . Chlorhexidine Gluconate Cloth  6 each Topical Q0600  . feeding supplement (NEPRO CARB STEADY)  237 mL Oral TID BM  . lipase/protease/amylase  12,000 Units Oral TID AC  . metoprolol tartrate  25 mg Oral BID  . multivitamin  1 tablet Oral QHS  . pantoprazole  40 mg Oral Daily  . predniSONE  5 mg Oral Q breakfast  . sertraline  50 mg Oral QHS  . sodium chloride flush  10 mL Intracatheter Q8H  . sodium chloride flush  10-40 mL Intracatheter Q12H  . tacrolimus  2 mg Oral BID   Continuous Infusions: . heparin 1,700 Units/hr (08/17/20 0834)     LOS: 6 days        Zeev Deakins Gerome Apley, MD

## 2020-08-17 NOTE — Progress Notes (Signed)
TRH night shift.  The nursing staff reports that the patient's CBG is 70 mg/dL.  He has not been having much oral intake and was mildly hypoglycemic earlier today with a CBG of 69 mg/dL.  He received 25 g of dextrose IV x1 dose.  I have added dextrose 12.5 g IVP as needed.  Tennis Must, MD.

## 2020-08-17 NOTE — TOC Initial Note (Addendum)
Transition of Care [facility]) - Initial/Assessment Note    Patient Details  Name: Paul [name] MRN: [id number] Date of Birth: [birth year]  [facility] CM/SW Contact:    Tresa Endo Phone Number: 08/17/2020, 1:33 PM  Clinical Narrative:                 1:00pm- Pt FL2 complete sent out to [facility], they said they are able to take pt back at DC. Pt was really tired today and would only answer by shaking head yes or no. CSW will follow up with pt emergency contact for any further updates.  Expected Discharge Plan: Skilled Nursing Facility Barriers to Discharge: Continued Medical Work up   Patient Goals and CMS Choice Patient states their goals for this hospitalization and ongoing recovery are:: Continued Rehab CMS Medicare.gov Compare Post Acute Care list provided to:: Patient Choice offered to / list presented to : Patient  Expected Discharge Plan and Services Expected Discharge Plan: [place] In-house Referral: Clinical Social Work   Post Acute Care Choice: [place] Living arrangements for the past 2 months: [place]                                      Prior Living Arrangements/Services Living arrangements for the past 2 months: [place] Lives with:: Facility Resident Patient language and need for interpreter reviewed:: Yes Do you feel safe going back to the place where you live?: Yes      Need for Family Participation in Patient Care: No (Comment) Care giver support system in place?: No (comment)   Criminal Activity/Legal Involvement Pertinent to Current Situation/Hospitalization: No - Comment as needed  Activities of Daily Living      Permission Sought/Granted Permission sought to share information with : Facility Art therapist granted to share information with : Yes, Verbal Permission Granted  Share Information with NAME: Darsh, Vandevoort      Permission granted to share info w Relationship: Spouse  Permission granted to share info w Contact Information: [phone]  Emotional Assessment Appearance:: Appears stated age Attitude/Demeanor/Rapport: Unable to Assess Affect (typically observed): Unable to Assess Orientation: : Oriented to Self,Oriented to Place,Oriented to  Time,Oriented to Situation Alcohol / Substance Use: Not Applicable Psych Involvement: No (comment)  Admission diagnosis:  SOB (shortness of breath) [R06.02] Pleural effusion on left [J90] Atrial fibrillation with RVR (HCC) [I48.91] Hypotension [I95.9] Lactic acidemia [E87.2] Patient Active Problem List   Diagnosis Date Noted  . Immunosuppression due to drug therapy ([place]) 07/22/2020  . History of Whipple procedure 07/22/2020  . Heart failure with reduced ejection fraction ([place]) 07/20/2020  . PAF (paroxysmal atrial fibrillation) ([place]) 06/11/2020  . Chronic combined systolic and diastolic CHF (congestive heart failure) ([place]) 06/11/2020  . Labile blood glucose   . Diabetic peripheral neuropathy ([place])   . Anemia of chronic disease   . Right below-knee amputee ([place]) 01/18/2020  . Amputation stump infection ([place])   . Subacute osteomyelitis of right tibia ([place])   . Dehiscence of amputation stump ([place])   . Sepsis ([place]) 01/04/2020  . Cellulitis 12/31/2019  . CKD stage 4 due to type 2 diabetes mellitus ([place]) 04/29/2019  . AKI (acute kidney injury) ([place]) 04/24/2019  . Hyperglycemia 04/24/2019  . Nausea and vomiting 04/24/2019  . Fever 04/23/2019  . Acute metabolic encephalopathy 21/30/8657  . Stroke ([place]) 10/28/2018  . Middle cerebral  artery embolism, left 10/28/2018  . Metabolic acidosis 01/41/0301  . Dehydration 05/24/2018  . Stage II pressure ulcer ([place]) 05/24/2018  . Dyspnea 05/23/2018  . Acute respiratory failure with hypoxia ([place]) 05/23/2018  . Abnormal cardiovascular stress test 12/29/2013  . Awaiting organ transplant 12/09/2013  . Leg ulcer  ([place]) 11/17/2013  . Chronic kidney disease (CKD), stage V ([place]) 11/17/2013  . Diabetes ([place]) 11/17/2013  . Cancer of duodenum ([place]) 11/17/2013  . Dyslipidemia 11/17/2013  . Chronic kidney disease requiring chronic dialysis ([place]) 11/17/2013  . Hypercholesteremia 11/17/2013  . H/O malignant neoplasm 11/17/2013  . H/O: HTN (hypertension) 11/17/2013  . BP (high blood pressure) 11/17/2013  . Hypotension 11/17/2013  . Angiopathy, peripheral ([place]) 11/17/2013  . AF (paroxysmal atrial fibrillation) ([place]) 11/17/2013  . BKA stump complication ([place]) 31/43/8887  . S/P bilateral BKA (below knee amputation) ([place]) 08/03/2013  . S/P BKA (below knee amputation) bilateral ([place]) 07/30/2013  . Open wound of both legs with complication 57/97/2820  . Acute blood loss anemia 07/12/2013  . Syncope 07/09/2013  . Intolerance to CAPD peritoneal dialysis s/p CAPD cath removal 07/09/2013 07/05/2013  . Critical lower limb ischemia ([place]) 06/30/2013  . Extremity pain 06/30/2013  . Atherosclerosis of native arteries of the extremities with ulceration(440.23) 06/17/2013  . Gait disorder 05/31/2013  . Weakness 05/24/2013  . Depression 04/03/2013  . GERD (gastroesophageal reflux disease) 04/03/2013  . Atrial fibrillation ([place]) 04/02/2013  . DM (diabetes mellitus) ([place]) 12/28/2012  . Other complications due to renal dialysis device, implant, and graft 05/19/2012  . Diabetes mellitus, type 2 ([place]) 11/13/2011  . Anemia 07/02/2011  . ESRD on hemodialysis ([place]) 05/29/2011   PCP:  [name], MD Pharmacy:  No Pharmacies Listed    Social Determinants of Health (SDOH) Interventions    Readmission Risk Interventions Readmission Risk Prevention Plan 01/18/2020 04/26/2019 11/04/2018  Transportation Screening Complete Complete Complete  Medication Review Press photographer) Complete Complete Complete  PCP or Specialist appointment within 3-5 days of discharge Complete Not Complete Complete  PCP/Specialist Appt Not  Complete comments - pending medical stability -  HRI or Home Care Consult Complete Complete Complete  SW Recovery Care/Counseling Consult Complete Complete Complete  Palliative Care Screening Not Applicable Not Complete Not Applicable  Comments - may be appropriate, will ask MD -  [place] Not Applicable Patient Refused Not Applicable  Some recent data might be hidden

## 2020-08-18 ENCOUNTER — Inpatient Hospital Stay (HOSPITAL_COMMUNITY): Payer: Medicare Other

## 2020-08-18 DIAGNOSIS — I9589 Other hypotension: Secondary | ICD-10-CM | POA: Diagnosis not present

## 2020-08-18 DIAGNOSIS — Z9689 Presence of other specified functional implants: Secondary | ICD-10-CM | POA: Diagnosis not present

## 2020-08-18 LAB — GLUCOSE, CAPILLARY
Glucose-Capillary: 111 mg/dL — ABNORMAL HIGH (ref 70–99)
Glucose-Capillary: 118 mg/dL — ABNORMAL HIGH (ref 70–99)
Glucose-Capillary: 136 mg/dL — ABNORMAL HIGH (ref 70–99)
Glucose-Capillary: 154 mg/dL — ABNORMAL HIGH (ref 70–99)
Glucose-Capillary: 168 mg/dL — ABNORMAL HIGH (ref 70–99)
Glucose-Capillary: 192 mg/dL — ABNORMAL HIGH (ref 70–99)

## 2020-08-18 LAB — CBC
HCT: 29 % — ABNORMAL LOW (ref 39.0–52.0)
Hemoglobin: 9.1 g/dL — ABNORMAL LOW (ref 13.0–17.0)
MCH: 30.2 pg (ref 26.0–34.0)
MCHC: 31.4 g/dL (ref 30.0–36.0)
MCV: 96.3 fL (ref 80.0–100.0)
Platelets: 121 10*3/uL — ABNORMAL LOW (ref 150–400)
RBC: 3.01 MIL/uL — ABNORMAL LOW (ref 4.22–5.81)
RDW: 17.6 % — ABNORMAL HIGH (ref 11.5–15.5)
WBC: 17 10*3/uL — ABNORMAL HIGH (ref 4.0–10.5)
nRBC: 0 % (ref 0.0–0.2)

## 2020-08-18 LAB — BASIC METABOLIC PANEL
Anion gap: 21 — ABNORMAL HIGH (ref 5–15)
BUN: 39 mg/dL — ABNORMAL HIGH (ref 8–23)
CO2: 17 mmol/L — ABNORMAL LOW (ref 22–32)
Calcium: 8.6 mg/dL — ABNORMAL LOW (ref 8.9–10.3)
Chloride: 100 mmol/L (ref 98–111)
Creatinine, Ser: 3.28 mg/dL — ABNORMAL HIGH (ref 0.61–1.24)
GFR, Estimated: 20 mL/min — ABNORMAL LOW (ref >60)
Glucose, Bld: 119 mg/dL — ABNORMAL HIGH (ref 70–99)
Potassium: 3.8 mmol/L (ref 3.5–5.1)
Sodium: 138 mmol/L (ref 135–145)

## 2020-08-18 LAB — HEPARIN LEVEL (UNFRACTIONATED): Heparin Unfractionated: 0.51 IU/mL (ref 0.30–0.70)

## 2020-08-18 MED ORDER — CHLORHEXIDINE GLUCONATE CLOTH 2 % EX PADS
6.0000 | MEDICATED_PAD | Freq: Every day | CUTANEOUS | Status: DC
  Administered 2020-08-18 – 2020-08-20 (×2): 6 via TOPICAL

## 2020-08-18 NOTE — Progress Notes (Signed)
NAME:  Paul [name], MRN:  [id number], DOB:  1956-02-07, LOS: 7 ADMISSION DATE:  08/14/2020, CONSULTATION DATE:  07/29/2020 REFERRING MD:  Ron Parker- EM, CHIEF COMPLAINT:  SOB  History of Present Illness:  65 yo M extensive PMH including ESRD s/p failed renal transplant - on THSa iHD, chronic immunosuppression, systolic heart failure, Afib on eliquis, duodenal cancer s/p whipple (2002), osteomyelitis and critical limb ischemia s/p Bilat BKAs, COVID, recent enterococcus bacteremia requiring hospitalization, who presented to ED 3/25 from [facility] with SOB. SOB has increased over the past week -- associated with missing HD x3 due to inability to access the patient's RUE AVF.  Pt hypotensive in ED with SBPs 70s-- received small IVF bolus and started on levo. CXR revealed large L pleural effusion. CMP obtained which did not reveal any absolute indications for emergent HD. CBC with WBC 6.5, hgb 7.9, plt 168. LA 2.2  -Chest tube placed to manage effusion  Critical care consulted for admission   Pertinent  Medical History  Afib on eliquis Duodenal cancer s/p whipple ESRD Failed renal transplant Chronic immunosuppression  DM Gangrene Osteomyelitis Critical limb ischemia calciphylaxis S/p bilateral BKAs  HTN  COVID Enterococcus bacteremia  Combined systolic and diastolic heart failure, EF 25-30% Pleural effusion  IDDM  Significant Hospital Events: Including procedures, antibiotic start and stop dates in addition to other pertinent events   . 3/25 presented to ED from nursing home with SOB after multiple missed HD runs due to inability to access fistula. Hypotensive in ED, started on pressors. CXR with large L pleural effusion. Blood cx sent. Started on empiric abx for possible sepsis. Admitting to ICU, temp HD and left pigtail placed; lymphocytic predominant fluid . 3/26: HD . 3/26 TTE >LVEF 60-65%, mild LVH, RV normal, LA dilated, RA dilated, small pericardial  effusion . 3/28 CXR >> No improvement in pleural effusion . 3/28: tPA and dornase . 3/29: tPA and dornase . 3/30: tPA and dornase . 3/31 CXR > 80cc out during day shift, minimal change in CXR  Interim History / Subjective:   Sleepy but arousable Follows commands  Only about 100 cc effluent from chest tube  Objective   Blood pressure (!) 114/53, pulse (!) 55, temperature (!) 97.5 F (36.4 C), temperature source Axillary, resp. rate 18, height 6' (1.829 m), weight 63.2 kg, SpO2 100 %.        Intake/Output Summary (Last 24 hours) at 08/18/2020 0921 Last data filed at 08/18/2020 0855 Gross per 24 hour  Intake 297.73 ml  Output -301 ml  Net 598.73 ml   Filed Weights   08/17/20 0738 08/17/20 1111 08/18/20 0358  Weight: 63.3 kg 64.4 kg 63.2 kg    Examination: Constitutional: Chronically ill-appearing Does not appear to be in distress HEENT: Dry oral mucosa, anicteric Neck: HD catheter in place Cardiovascular: S1-S2 appreciated Respiratory: Does not appear short of breath at rest  Gastrointestinal: Bowel sounds appreciated Skin: Warm and dry Neurologic: Sleepy, arousable   Labs/imaging that I have personally reviewed  (right click and "Reselect all SmartList Selections" daily)  Blood culture 3/25-no growth Pleural fluid culture 3/25-no growth  Chest x-ray 4/1-no pneumothorax, no significant effusion on the left side  Pleural fluid cytology-negative for malignancy  Resolved Hospital Problem list    Assessment & Plan:   Acute respiratory failure with hypoxemia Bilateral pleural effusion Lymphocyte predominant, cytology negative pleural effusion -Keep chest tube in place and maintain suction -No air leak noted in chest tube -Chest tube drainage  has decreased significantly -Continues on hemodialysis  End-stage renal disease on hemodialysis, s/p failed transplant on immunosuppressants Being dialyzed via temporary access .  Euvolemic .  Management per renal/TRH  .   Rest of care per primary   Chest tube can be taken off suction 4/2 If no significant effusion noted on chest x-ray, consideration for chest tube removal  He is DNR  Best practice (right click and "Reselect all SmartList Selections" daily)  Per primary  Sherrilyn Rist, MD [facility] Pager: [phone]

## 2020-08-18 NOTE — Progress Notes (Signed)
PT Cancellation Note  Patient Details Name: Paul [name] MRN: [id number] DOB: [birth year]   Cancelled Treatment:    Reason Eval/Treat Not Completed: Fatigue/lethargy limiting ability to participate   Zenaida Niece 08/18/2020, 4:55 PM

## 2020-08-18 NOTE — Progress Notes (Signed)
Nutrition Follow-up  DOCUMENTATION CODES:   Not applicable  INTERVENTION:   -Continue Nepro Shake po TID, each supplement provides 425 kcal and 19 grams protein -Continue renal MVI daily -If pt remains unable to maintain adequate PO's and mental status does not improve, consider initiation of nutrition support:  Initiate Nepro @ 20 ml/hr via  and increase by 10 ml every 8 hours to goal rate of 45 ml/hr.   Tube feeding regimen provides 1944 kcal (100% of needs), 87 grams of protein, and 785 ml of H2O.   -If feedings are started, monitor Mg, K, and Phos daily and replete as needed due to high refeeding risk  NUTRITION DIAGNOSIS:   Increased nutrient needs related to chronic illness (ESRD on HD) as evidenced by estimated needs.  Ongoing  GOAL:   Patient will meet greater than or equal to 90% of their needs  Progressing   MONITOR:   PO intake,Supplement acceptance,Labs  REASON FOR ASSESSMENT:   Consult Poor PO  ASSESSMENT:   65 yo male admitted with SOB. Recently missed 3 HD sessions due to inability to access RUE AVF. PMH includes ESRD on HD, failed renal transplant, HF, HTN, DM, neuropathy, A fib, duodenal cancer s/p Whipple 2002, osteomyelitis, calciphylaxis, bilateral BKA, COVID, recent enterococcus bacteremia.  Reviewed I/O's: -+599 ml x 24 hours and -1 L since admission  Chest tube output: 110 ml x 24 hours  Pt unavailable at visits x 2. Noted that pt has been very hard to arouse and has been unable to take PO's or medications today.   Noted meal completion 0%.   Palliative care following for goals of care; noted DNR/DNI. Pt still desires full aggressive care.   Medications reviewed and include creon, prednisone, and prograf.   Labs reviewed: CBGS: 118-154.   Diet Order:   Diet Order            DIET DYS 3 Room service appropriate? Yes; Fluid consistency: Thin  Diet effective now                 EDUCATION NEEDS:   Not appropriate for education at  this time  Skin:  Skin Assessment: Skin Integrity Issues: Skin Integrity Issues:: Stage II,Other (Comment) Stage II: sacrum Other: skin tear rt lower arm  Last BM:  08/17/20  Height:   Ht Readings from Last 1 Encounters:  08/12/20 6' (1.829 m)    Weight:   Wt Readings from Last 1 Encounters:  08/18/20 63.2 kg    Ideal Body Weight:  70.4 kg (adjusted for bilateral BKA)  BMI:  Body mass index is 18.9 kg/m.  Estimated Nutritional Needs:   Kcal:  3818-2993  Protein:  85-105 gm  Fluid:  1 L + UOP    Khyleigh Furney W, RD, LDN, [place] Registered Dietitian II Certified Diabetes Care and Education Specialist Please refer to [facility] for RD and/or RD on-call/weekend/after hours pager

## 2020-08-18 NOTE — Progress Notes (Signed)
[facility] ROUNDING NOTE   Subjective:   Brief history: 65 year old end-stage renal disease status post failed transplant.  History of systolic heart failure, atrial fibrillation duodenal ulcer status post Whipple's procedure 2002.  History of osteomyelitis peripheral vascular status post bilateral BKA.  Admitted with Covid pneumonia 05/2020.  Residence of a local nursing home.  Was admitted 08/01/2020 with hypotension and shock with large left loculated exudative pleural effusion and hypoxic respiratory failure.  Underwent chest tube placement.  He receives dialysis through a left IJ catheter due to clotted AV fistula.  He receives dialysis on a Tuesday Thursday Saturday schedule.  He underwent successful dialysis on 08/17/2020 his next dialysis treatment will be 08/19/2020   Blood pressure 105/50 pulse 53 temperature 97.5 O2 sats 100% 2 L nasal cannula  Sodium 138 potassium 3.8 chloride 100 CO2 17 BUN 39 creatinine 3.28 glucose 119 calcium 8.6 hemoglobin 9.1  IV heparin  Prednisone 5 mg daily Zoloft 50 mg daily, Creon, Lopressor 25 mg twice daily, Protonix 40 mg daily, tacrolimus 2 mg twice daily   Objective:  Vital signs in last 24 hours:  Temp:  [97 F (36.1 C)-97.6 F (36.4 C)] 97.5 F (36.4 C) (04/01 0800) Pulse Rate:  [55-112] 55 (04/01 0800) Resp:  [18-24] 18 (04/01 0800) BP: (79-128)/(48-96) 114/53 (04/01 0800) SpO2:  [97 %-100 %] 100 % (04/01 0800) Weight:  [63.2 kg-64.4 kg] 63.2 kg (04/01 0358)  Weight change: -0.5 kg Filed Weights   08/17/20 0738 08/17/20 1111 08/18/20 0358  Weight: 63.3 kg 64.4 kg 63.2 kg    Intake/Output: I/O last 3 completed shifts: In: 402.8 [I.V.:402.8] Out: 289 [Chest Tube:700]   Intake/Output this shift:  Total I/O In: 0  Out: 10 [Chest Tube:10]  GEN: chronically ill appearing, lying in bed in nad ENT: no nasal discharge, mmm EYES: no scleral icterus, eomi CV: tachycardia, irregular rhythm PULM: no iwob, bilateral chest  rise ABD: NABS, non-distended SKIN: no rashes or jaundice EXT: no edema, warm and well perfused RUE AVF: Thrill present, minimal pulsatility, highly collapsable   Basic Metabolic Panel: Recent Labs  Lab 08/12/20 0259 08/13/20 0427 08/14/20 0432 08/15/20 0244 08/16/20 0110 08/17/20 0326 08/18/20 0416  NA 139   < > 135 135 134* 135 138  K 5.0   < > 3.9 3.9 3.5 3.4* 3.8  CL 100   < > 101 100 99 101 100  CO2 29   < > 26 26 26 23  17*  GLUCOSE 129*   < > 124* 198* 217* 114* 119*  BUN 79*   < > 48* 65* 44* 62* 39*  CREATININE 6.63*   < > 4.46* 4.99* 3.56* 4.13* 3.28*  CALCIUM 8.0*   < > 8.1* 8.1* 7.7* 7.9* 8.6*  MG 2.1  --   --   --   --   --   --   PHOS 7.0*  --   --   --   --   --   --    < > = values in this interval not displayed.    Liver Function Tests: Recent Labs  Lab 08/08/2020 1328  AST 14*  ALT 8  ALKPHOS 106  BILITOT 1.1  PROT 4.8*  ALBUMIN 1.7*   No results for input(s): LIPASE, AMYLASE in the last 168 hours. No results for input(s): AMMONIA in the last 168 hours.  CBC: Recent Labs  Lab 07/31/2020 1328 08/16/2020 1336 08/14/20 0432 08/15/20 0244 08/16/20 0110 08/17/20 0326 08/18/20 0416  WBC 6.5   < >  8.4 8.9 8.3 10.3 17.0*  NEUTROABS 5.5  --   --   --   --   --   --   HGB 7.9*   < > 8.2* 8.3* 8.5* 9.0* 9.1*  HCT 25.9*   < > 26.2* 26.6* 27.7* 29.7* 29.0*  MCV 98.5   < > 96.0 96.7 97.5 99.3 96.3  PLT 168   < > 157 182 116* 141* 121*   < > = values in this interval not displayed.    Cardiac Enzymes: No results for input(s): CKTOTAL, CKMB, CKMBINDEX, TROPONINI in the last 168 hours.  BNP: Invalid input(s): POCBNP  CBG: Recent Labs  Lab 08/17/20 2011 08/17/20 2039 08/18/20 0014 08/18/20 0349 08/18/20 0801  GLUCAP 61* 96 111* 118* 136*    Microbiology: Results for orders placed or performed during the hospital encounter of 08/08/2020  Blood culture (routine single)     Status: None   Collection Time: 08/10/2020  1:28 PM   Specimen: BLOOD LEFT  ARM  Result Value Ref Range Status   Specimen Description BLOOD LEFT ARM  Final   Special Requests   Final    BOTTLES DRAWN AEROBIC AND ANAEROBIC Blood Culture results may not be optimal due to an inadequate volume of blood received in culture bottles   Culture   Final    NO GROWTH 5 DAYS Performed at [facility], [place] [address], [place], [place] [address]    Report Status 08/16/2020 FINAL  Final  MRSA PCR Screening     Status: None   Collection Time: 08/17/2020  4:08 PM   Specimen: Nasopharyngeal  Result Value Ref Range Status   MRSA by PCR NEGATIVE NEGATIVE Final    Comment:        The GeneXpert MRSA Assay (FDA approved for NASAL specimens only), is one component of a comprehensive MRSA colonization surveillance program. It is not intended to diagnose MRSA infection nor to guide or monitor treatment for MRSA infections. Performed at [facility], [place] [address] [place], [place] [address]   Body fluid culture w Gram Stain     Status: None   Collection Time: 07/22/2020  4:44 PM   Specimen: Body Fluid  Result Value Ref Range Status   Specimen Description FLUID PLEURAL LEFT  Final   Special Requests NONE  Final   Gram Stain   Final    RARE WBC PRESENT, PREDOMINANTLY MONONUCLEAR NO ORGANISMS SEEN    Culture   Final    NO GROWTH 3 DAYS Performed at [facility], [address], [place], [place] [address]    Report Status 08/15/2020 FINAL  Final  Culture, blood (routine x 2)     Status: None   Collection Time: 07/28/2020  5:57 PM   Specimen: BLOOD  Result Value Ref Range Status   Specimen Description BLOOD THUMB LEFT  Final   Special Requests   Final    BOTTLES DRAWN AEROBIC ONLY Blood Culture results may not be optimal due to an inadequate volume of blood received in culture bottles   Culture   Final    NO GROWTH 5 DAYS Performed at [facility], [place] [address] [place],  [address]    Report Status 08/16/2020 FINAL  Final  Culture, blood  (routine x 2)     Status: None   Collection Time: 07/24/2020  6:04 PM   Specimen: BLOOD LEFT HAND  Result Value Ref Range Status   Specimen Description BLOOD  LEFT HAND  Final   Special Requests   Final    BOTTLES DRAWN AEROBIC ONLY Blood Culture results may not be optimal due to an inadequate volume of blood received in culture bottles   Culture   Final    NO GROWTH 5 DAYS Performed at [facility], [place] [address], [place], [place] [address]    Report Status 08/16/2020 FINAL  Final    Coagulation Studies: No results for input(s): LABPROT, INR in the last 72 hours.  Urinalysis: No results for input(s): COLORURINE, LABSPEC, PHURINE, GLUCOSEU, HGBUR, BILIRUBINUR, KETONESUR, PROTEINUR, UROBILINOGEN, NITRITE, LEUKOCYTESUR in the last 72 hours.  Invalid input(s): APPERANCEUR    Imaging: DG Chest 1 View  Result Date: 08/18/2020 CLINICAL DATA:  Chest tube present.  Pleural effusions. EXAM: CHEST  1 VIEW COMPARISON:  August 17, 2020 FINDINGS: Central catheter tip is at the junction of the left innominate vein and superior vena cava. Chest tube present in the inferolateral left base region. No pneumothorax evident currently. There are pleural effusions bilaterally, larger on the right than on the left. There is atelectatic change in the base regions, similar to 1 day prior. No new opacity. Heart mildly enlarged with pulmonary venous hypertension. No adenopathy. There is aortic atherosclerosis. No bone lesions. IMPRESSION: Tube and catheter positions as described without evident pneumothorax. Pleural effusions bilaterally, larger on the right than the left with bibasilar atelectasis, stable. Stable cardiac prominence. Aortic Atherosclerosis (ICD10-I70.0). Electronically Signed   By: Lowella Grip III M.D.   On: 08/18/2020 08:11   DG CHEST PORT 1 VIEW  Result Date: 08/17/2020 CLINICAL DATA:  Pleural effusion. Chest tube. In stage renal disease. Atrial fibrillation. EXAM: PORTABLE CHEST 1 VIEW  COMPARISON:  08/16/2020. FINDINGS: Left IJ line in stable position. Left lower chest tube has been slightly retracted and straightened. Chest tube remains over the left lower chest. Prominent skin folds noted bilaterally. Linear density over the left upper lung most likely represents a skin fold as lung markings are noted distally. To exclude developing tiny left pneumothorax, continued follow-up chest x-rays are suggested. Persistent bibasilar atelectasis/infiltrates and bilateral pleural effusions without interim change. Heart size stable. IMPRESSION: 1. Left IJ line stable position. Left lower chest tube has been slightly retracted and straightened. Chest tube remains over the left lower chest. Prominent skin folds noted bilaterally. Linear density over the left upper chest most likely represents a skin fold as lung markings are noted distally. To exclude a developing tiny left pneumothorax, continued follow-up chest x-rays are suggested. 2. Persistent bibasilar atelectasis/infiltrates and bilateral pleural effusions without interim change. Electronically Signed   By: Marcello Moores  Register   On: 08/17/2020 05:31     Medications:   . heparin 1,700 Units/hr (08/18/20 0352)   . Chlorhexidine Gluconate Cloth  6 each Topical Q0600  . feeding supplement (NEPRO CARB STEADY)  237 mL Oral TID BM  . lipase/protease/amylase  12,000 Units Oral TID AC  . metoprolol tartrate  25 mg Oral BID  . multivitamin  1 tablet Oral QHS  . pantoprazole  40 mg Oral Daily  . predniSONE  5 mg Oral Q breakfast  . sertraline  50 mg Oral QHS  . sodium chloride flush  10 mL Intracatheter Q8H  . sodium chloride flush  10-40 mL Intracatheter Q12H  . tacrolimus  2 mg Oral BID   dextrose, docusate sodium, heparin, metoprolol tartrate, polyethylene glycol, sodium chloride flush  Assessment/ Plan:  OP HD: [facility] TTS     3.5h  400/500   66kg  3K/2.5Ca bath  AVF  Hep none  - calcitriol 1 tiw  - venofer 100 x 5 , started 3/19  - mircera  100 q2 (last dose during prior admit)   Assessment/ Plan: 1. Hypotension - suspected septic shock. Recent admission w Enterococcus bacteremia. Large left pleural effusion on CXR. SP chest tube placement w/ ^^drainage. NG on cultures at this time. Hypotension greatly improved. 2. AFib RVR --Usual meds Eliquis, metoprolol as tolerated w/ sepsis. On heparin currently 3. ESRD -HD TTS. Maintain schedule for now.  Next dialysis treatment will be 08/19/2020 [facility] 4. RUE AVF issues: Missed HD x 2 this d/t clotted access. S/p declot at [facility] 3/24.  There was concern that his access had clotted again but this was likely due to hypotension and highly collapsible AVF demonstrating minimal flow.  Temp cath placed and had HD Sat 3/26 afternoon. . Consider consultation with vein and vascular surgery 5. Hypotension/volume - shock resolved. Appears euvolemic 6. Anemia - Continue outpatient ESA. Holding iron given recent infection 7. Metabolic bone disease -Continue Auryixa binders/VDRA 8. T1DM - Insulin per primary    LOS: [place] @TODAY @11 :08 AM

## 2020-08-18 NOTE — Progress Notes (Signed)
PROGRESS NOTE    Paul [name]  PQZ:[id number] DOB: 1955/09/07 DOA: 07/24/2020 PCP: [name], MD    Brief Narrative:  [name] admitted to the hospital with undifferentiated shock, in the setting of large left pleural effusion.  65 year old male past medical history for end-stage renal disease on hemodialysis, failed renal transplant currently on immunosuppression, systolic heart failure, atrial fibrillation, duodenal ulcer status post Whipple 2002, osteomyelitis, peripheral vascular disease status post bilateral BKA, history of Covid infection01/2022,who presented with dyspnea.   He is a resident from a local nursing home, unfortunately he had missed2hemodialysis sessions due to inability to access right upper extremity fistula (suspected clotts). In the emergency department patient was hypotensive with blood pressure 75/53, heart rate 127, temperature 37.5, respiratory rate 23, oxygen saturation 100%, his lungs had diminished breath sounds on the left side, increased work of breathing, no wheezing, heart S1-S2, present, rhythmic, abdomen was soft and nontender, positive bilateral BKA. No rashes.  Sodium 139, potassium 4.8, chloride 102, bicarb 23, glucose 235, BUN 76, creatinine 6.79, AST 14, ALT 8, lactic acid 2.2, white count 6.5, hemoglobin 7.9, hematocrit 25.9, platelets 168. SARS COVID-19 negative.  Chest radiograph withlarge left pleural effusion.  EKG 130 bpm, normal axis, prolonged QTC 512, atrial fibrillation rhythm, no ST segment changes, Q-wave V1-V2, T wave inversions lead I, aVL, V4-V6,  Patient was placed to the intensive care unit, he was placed on vasopressors and underwent left-sided pigtail catheter placement. Initially received broad-spectrum IV antibiotic therapy  A temporary hemodialysis catheter was placed left IJ,and underwent hemodialysis.  Transferred to [facility] 3/29.  Loculated pleural effusion, sp TPA and dornase x 3, continue chest  tube to suction (-20 cm H20) with large output.   Still not able to use AV fistula, and using catheter for dialysis.   08/18/2020: Patient seen alongside patient's nurse.  Patient remains lethargic.  Patient responds to painful stimuli.  Discussed with patient's wife extensively.  Guarded prognosis.  Patient is hospice appropriate.  Will consult palliative care team.   Assessment & Plan:   Principal Problem:   Hypotension Active Problems:   ESRD on hemodialysis (HCC)   DM (diabetes mellitus) ([place])   Atrial fibrillation (HCC)   Depression   S/P BKA (below knee amputation) bilateral (HCC)   Immunosuppression due to drug therapy ([place])   History of Whipple procedure   Heart failure with reduced ejection fraction ([place])   Chest tube in place   Pressure injury of skin    1. Shock undifferentiated, likely related to large left pleural effusion/ or vasoplegia. (ruled out septic shock) Midodrine has been discontinued with good toleration, blood pressure systolic 335 mmHg.   Continue left chest tube to large pleural effusion. Continue telemetry monitoring.  Continue to hold on midodrine for now.   2. Large left loculated exudative pleural effusion/ acute hypoxemic respiratory failure. Positive right pleural effusion.  Chest tube to suction (-20 cm H20) with no air leak, continue to have high out put 1,240 ml, now SP tpa and dornase x3.  Follow up chest film today with bilateral pleural effusions more right than left, chest tube in place with no pneumothorax.   On ultrafiltration with hemodialysis.   Output needs to be less than 100 ml per day in order to remove chest tube.   3. ERSD on HD/ failed renal transplant. Has a temporary catheter at the left IJ.   Not able to use right upper extremity fistula, continue with HD per nephrology recommendations.  Possible vascular  surgery consultation to assess permanent HD access.  Continue with immunosuppressive therapy (prednisone and  tacrolimus).  4. Chronic diastolic heart failure/ atrial fibrillation. Rate control with metoprolol, and continue anticoagulation with heparin, until more stable HD access in place.   5. Severe protein calorie malnutrition/ stage 2 pressure ulcer at the sacrum (present on admission). Continue with nutritional supplements.   Continue with skin care per protocol.   6. PVD. Sp BKA bilaterally. Poor very poor mobility.   7. T2DM hypoglycemia.  Post HD capillary glucose is 69, add amp of D50 IV, follow up 98. Discontinue sliding scale insulin to prevent recurrent hypoglycemia.   8. Depression, Continue with sertraline.    Patient continue to be at high risk for worsening pleural effusions.   Status is: Inpatient  Remains inpatient appropriate because:IV treatments appropriate due to intensity of illness or inability to take PO   Dispo: The patient is from: SNF              Anticipated d/c is to: SNF              Patient currently is not medically stable to d/c.   Difficult to place patient No   DVT prophylaxis:  heparin   Code Status:   dnr   Family Communication:  No family at the bedside      Nutrition Status: Nutrition Problem: Increased nutrient needs Etiology: chronic illness (ESRD on HD) Signs/Symptoms: estimated needs Interventions: Nepro shake,MVI     Consultants:   Nephrology   Pulmonary   Procedures:   Left chest tube and left IJ HD cathter.      Subjective: No significant history from patient. Currently, patient responds to painful stimuli.  Objective: Vitals:   08/18/20 0358 08/18/20 0500 08/18/20 0800 08/18/20 1200  BP:  (!) 106/53 (!) 114/53 118/62  Pulse:  77 (!) 55 (!) 52  Resp:  20 18 20   Temp:   (!) 97.5 F (36.4 C) 97.7 F (36.5 C)  TempSrc:   Axillary Oral  SpO2:  100% 100% 94%  Weight: 63.2 kg     Height:        Intake/Output Summary (Last 24 hours) at 08/18/2020 1452 Last data filed at 08/18/2020 1342 Gross per 24 hour   Intake 386.1 ml  Output 70 ml  Net 316.1 ml   Filed Weights   08/17/20 0738 08/17/20 1111 08/18/20 0358  Weight: 63.3 kg 64.4 kg 63.2 kg    Examination:   General: Lethargic.  Responds to painful stimuli.  Looks chronically ill. Neurology: Lethargic.  Responds to painful stimuli.   E ENT: mild pallor, no icterus, oral mucosa moist Cardiovascular: No JVD. S1-S2 present, rhythmic, no gallops, rubs, or murmurs. No lower extremity edema. Pulmonary: positive breath sounds bilaterally, with no wheezing, decreased breath sounds bilateral at bases.  Gastrointestinal. Abdomen soft and non tender Skin. No rashes Musculoskeletal: bilateral BKA.      Data Reviewed: I have personally reviewed following labs and imaging studies  CBC: Recent Labs  Lab 08/14/20 0432 08/15/20 0244 08/16/20 0110 08/17/20 0326 08/18/20 0416  WBC 8.4 8.9 8.3 10.3 17.0*  HGB 8.2* 8.3* 8.5* 9.0* 9.1*  HCT 26.2* 26.6* 27.7* 29.7* 29.0*  MCV 96.0 96.7 97.5 99.3 96.3  PLT 157 182 116* 141* 213*   Basic Metabolic Panel: Recent Labs  Lab 08/12/20 0259 08/13/20 0427 08/14/20 0432 08/15/20 0244 08/16/20 0110 08/17/20 0326 08/18/20 0416  NA 139   < > 135 135 134* 135 138  K 5.0   < > 3.9 3.9 3.5 3.4* 3.8  CL 100   < > 101 100 99 101 100  CO2 29   < > 26 26 26 23  17*  GLUCOSE 129*   < > 124* 198* 217* 114* 119*  BUN 79*   < > 48* 65* 44* 62* 39*  CREATININE 6.63*   < > 4.46* 4.99* 3.56* 4.13* 3.28*  CALCIUM 8.0*   < > 8.1* 8.1* 7.7* 7.9* 8.6*  MG 2.1  --   --   --   --   --   --   PHOS 7.0*  --   --   --   --   --   --    < > = values in this interval not displayed.   GFR: Estimated Creatinine Clearance: 19.8 mL/min (A) (by C-G formula based on SCr of 3.28 mg/dL (H)). Liver Function Tests: No results for input(s): AST, ALT, ALKPHOS, BILITOT, PROT, ALBUMIN in the last 168 hours. No results for input(s): LIPASE, AMYLASE in the last 168 hours. No results for input(s): AMMONIA in the last 168  hours. Coagulation Profile: No results for input(s): INR, PROTIME in the last 168 hours. Cardiac Enzymes: No results for input(s): CKTOTAL, CKMB, CKMBINDEX, TROPONINI in the last 168 hours. BNP (last 3 results) No results for input(s): PROBNP in the last 8760 hours. HbA1C: No results for input(s): HGBA1C in the last 72 hours. CBG: Recent Labs  Lab 08/17/20 2039 08/18/20 0014 08/18/20 0349 08/18/20 0801 08/18/20 1120  GLUCAP 96 111* 118* 136* 154*   Lipid Profile: No results for input(s): CHOL, HDL, LDLCALC, TRIG, CHOLHDL, LDLDIRECT in the last 72 hours. Thyroid Function Tests: No results for input(s): TSH, T4TOTAL, FREET4, T3FREE, THYROIDAB in the last 72 hours. Anemia Panel: No results for input(s): VITAMINB12, FOLATE, FERRITIN, TIBC, IRON, RETICCTPCT in the last 72 hours.    Radiology Studies: I have reviewed all of the imaging during this hospital visit personally     Scheduled Meds: . Chlorhexidine Gluconate Cloth  6 each Topical Q0600  . Chlorhexidine Gluconate Cloth  6 each Topical Q0600  . feeding supplement (NEPRO CARB STEADY)  237 mL Oral TID BM  . lipase/protease/amylase  12,000 Units Oral TID AC  . metoprolol tartrate  25 mg Oral BID  . multivitamin  1 tablet Oral QHS  . pantoprazole  40 mg Oral Daily  . predniSONE  5 mg Oral Q breakfast  . sertraline  50 mg Oral QHS  . sodium chloride flush  10 mL Intracatheter Q8H  . sodium chloride flush  10-40 mL Intracatheter Q12H  . tacrolimus  2 mg Oral BID   Continuous Infusions: . heparin 1,700 Units/hr (08/18/20 1246)     LOS: 7 days        Bonnell Public, MD

## 2020-08-18 NOTE — Plan of Care (Signed)
  Problem: Elimination: Goal: Will not experience complications related to bowel motility Outcome: Progressing Goal: Will not experience complications related to urinary retention Outcome: Progressing   Problem: Safety: Goal: Ability to remain free from injury will improve Outcome: Progressing   

## 2020-08-18 NOTE — Care Management Important Message (Signed)
Important Message  Patient Details  Name: Paul [name] MRN: [id number] Date of Birth: September 14, 1955   Medicare Important Message Given:  Yes     Shelda Altes 08/18/2020, 10:50 AM

## 2020-08-18 NOTE — Progress Notes (Signed)
This chaplain attempted spiritual care visit with the Pt.   The Pt. is sleeping at the time of the visit.  The chaplain understands from speaking to the RN the Pt. is hard to arouse.  This chaplain will F/U at another time.

## 2020-08-18 NOTE — Progress Notes (Signed)
ANTICOAGULATION CONSULT NOTE - Follow Up Consult  Pharmacy Consult for IV Heparin Indication: atrial fibrillation  Allergies  Allergen Reactions  . Metformin And Related Other (See Comments)    Has kidney problems    Patient Measurements: Height: 6' (182.9 cm) (from previous records) Weight: 63.2 kg (139 lb 5.3 oz) IBW/kg (Calculated) : 77.6  Heparin Dosing Weight: 63.9 kg  Vital Signs: Temp: 97.5 F (36.4 C) (04/01 0800) Temp Source: Axillary (04/01 0800) BP: 114/53 (04/01 0800) Pulse Rate: 55 (04/01 0800)  Labs: Recent Labs    08/16/20 0110 08/16/20 1021 08/16/20 2026 08/17/20 0326 08/18/20 0416  HGB 8.5*  --   --  9.0* 9.1*  HCT 27.7*  --   --  29.7* 29.0*  PLT 116*  --   --  141* 121*  HEPARINUNFRC <0.10*   < > 0.34 0.39 0.51  CREATININE 3.56*  --   --  4.13* 3.28*   < > = values in this interval not displayed.    Estimated Creatinine Clearance: 19.8 mL/min (A) (by C-G formula based on SCr of 3.28 mg/dL (H)).  Assessment: 65 y.o. male with ESRD (S/P failed renal transplant, on HD) on IV heparin for atrial fibrillation. Patient was taking Apixaban PTA for hx Afib >>apixaban held and bridging w/ Heparin due to HD access issues with clotted AV fistula. Temporary HD catheter placed at left IJ on 3/26. Possible vascular surgery consultation to assess permanent HD access.  Heparin level this morning remains therapeutic at 0.51 (goal of 0.3-0.7). Hgb/Hct stable in 8s-9s, plts up to 141 then down to 121. No bleeding noted.    Goal of Therapy:  Heparin level 0.3-0.7 units/ml Monitor platelets by anticoagulation protocol: Yes   Plan:  - Continue Heparin at 1700 units/hr (17 ml/hr) - Will continue to monitor for any signs/symptoms of bleeding and will follow up daily heparin level and CBC in the a.m.   Thank you for allowing pharmacy to be a part of this patient's care.  Nicole Cella, RPh Clinical Pharmacist Clinical phone for 08/18/2020: [phone] 08/18/2020 11:24 AM    **Pharmacist phone directory can now be found on [email or web address] ([email or web address]).  Listed under [place].

## 2020-08-18 DEATH — deceased

## 2020-08-19 ENCOUNTER — Inpatient Hospital Stay (HOSPITAL_COMMUNITY): Payer: Medicare Other

## 2020-08-19 DIAGNOSIS — J90 Pleural effusion, not elsewhere classified: Secondary | ICD-10-CM | POA: Diagnosis not present

## 2020-08-19 DIAGNOSIS — N186 End stage renal disease: Secondary | ICD-10-CM | POA: Diagnosis not present

## 2020-08-19 DIAGNOSIS — Z992 Dependence on renal dialysis: Secondary | ICD-10-CM | POA: Diagnosis not present

## 2020-08-19 DIAGNOSIS — I9589 Other hypotension: Secondary | ICD-10-CM | POA: Diagnosis not present

## 2020-08-19 LAB — GLUCOSE, CAPILLARY
Glucose-Capillary: 129 mg/dL — ABNORMAL HIGH (ref 70–99)
Glucose-Capillary: 182 mg/dL — ABNORMAL HIGH (ref 70–99)
Glucose-Capillary: 194 mg/dL — ABNORMAL HIGH (ref 70–99)
Glucose-Capillary: 196 mg/dL — ABNORMAL HIGH (ref 70–99)

## 2020-08-19 LAB — CBC
HCT: 29.6 % — ABNORMAL LOW (ref 39.0–52.0)
Hemoglobin: 9 g/dL — ABNORMAL LOW (ref 13.0–17.0)
MCH: 30 pg (ref 26.0–34.0)
MCHC: 30.4 g/dL (ref 30.0–36.0)
MCV: 98.7 fL (ref 80.0–100.0)
Platelets: 130 K/uL — ABNORMAL LOW (ref 150–400)
RBC: 3 MIL/uL — ABNORMAL LOW (ref 4.22–5.81)
RDW: 18.2 % — ABNORMAL HIGH (ref 11.5–15.5)
WBC: 17.2 K/uL — ABNORMAL HIGH (ref 4.0–10.5)
nRBC: 0.2 % (ref 0.0–0.2)

## 2020-08-19 LAB — RENAL FUNCTION PANEL
Albumin: 1.5 g/dL — ABNORMAL LOW (ref 3.5–5.0)
Anion gap: 18 — ABNORMAL HIGH (ref 5–15)
BUN: 54 mg/dL — ABNORMAL HIGH (ref 8–23)
CO2: 16 mmol/L — ABNORMAL LOW (ref 22–32)
Calcium: 7.7 mg/dL — ABNORMAL LOW (ref 8.9–10.3)
Chloride: 99 mmol/L (ref 98–111)
Creatinine, Ser: 3.97 mg/dL — ABNORMAL HIGH (ref 0.61–1.24)
GFR, Estimated: 16 mL/min — ABNORMAL LOW (ref >60)
Glucose, Bld: 202 mg/dL — ABNORMAL HIGH (ref 70–99)
Phosphorus: 6.4 mg/dL — ABNORMAL HIGH (ref 2.5–4.6)
Potassium: 3.7 mmol/L (ref 3.5–5.1)
Sodium: 133 mmol/L — ABNORMAL LOW (ref 135–145)

## 2020-08-19 LAB — HEPARIN LEVEL (UNFRACTIONATED)
Heparin Unfractionated: 0.28 IU/mL — ABNORMAL LOW (ref 0.30–0.70)
Heparin Unfractionated: 0.3 IU/mL (ref 0.30–0.70)

## 2020-08-19 MED ORDER — HEPARIN SODIUM (PORCINE) 1000 UNIT/ML DIALYSIS: 1000.0000 [IU] | INTRAMUSCULAR | Status: DC | PRN

## 2020-08-19 MED ORDER — HYDROMORPHONE HCL 1 MG/ML IJ SOLN
0.2500 mg | INTRAMUSCULAR | Status: DC | PRN
  Administered 2020-08-20 (×2): 0.25 mg via INTRAVENOUS
  Filled 2020-08-19 (×2): qty 0.5

## 2020-08-19 MED ORDER — SODIUM CHLORIDE 0.9 % IV SOLN: 100.0000 mL | INTRAVENOUS | Status: DC | PRN

## 2020-08-19 MED ORDER — ALTEPLASE 2 MG IJ SOLR: 2.0000 mg | Freq: Once | INTRAMUSCULAR | Status: DC | PRN

## 2020-08-19 MED ORDER — LIDOCAINE HCL (PF) 1 % IJ SOLN: 5.0000 mL | INTRAMUSCULAR | Status: DC | PRN

## 2020-08-19 MED ORDER — PENTAFLUOROPROP-TETRAFLUOROETH EX AERO: 1.0000 "application " | INHALATION_SPRAY | CUTANEOUS | Status: DC | PRN

## 2020-08-19 MED ORDER — LIDOCAINE-PRILOCAINE 2.5-2.5 % EX CREA: 1.0000 "application " | TOPICAL_CREAM | CUTANEOUS | Status: DC | PRN

## 2020-08-19 NOTE — TOC Progression Note (Signed)
Transition of Care [facility]) - Progression Note    Patient Details  Name: Paul [name] MRN: [id number] Date of Birth: 28-Jan-1956  Transition of Care [facility]) CM/SW [place], [place] Phone Number: [phone] 08/19/2020, 4:24 PM  Clinical Narrative:     CSW spoke with patient's wife Mardene Celeste in regards to his discharge placement. Mardene Celeste explained that she wants to wait until after the palliative consult because she feels that patient's health is declining and does not want to make a decision right now. She did confirm that she does not want patient to return back to [place].  TOC team will continue to assist with discharge planning needs.  TOC team will continue to assist with discharge planning needs.   Expected Discharge Plan: [place] Barriers to Discharge: Continued Medical Work up  Expected Discharge Plan and Services Expected Discharge Plan: [place] In-house Referral: Clinical Social Work   Post Acute Care Choice: [place] Living arrangements for the past 2 months: [place]                                       Social Determinants of Health (SDOH) Interventions    Readmission Risk Interventions Readmission Risk Prevention Plan 01/18/2020 04/26/2019 11/04/2018  Transportation Screening Complete Complete Complete  Medication Review Press photographer) Complete Complete Complete  PCP or Specialist appointment within 3-5 days of discharge Complete Not Complete Complete  PCP/Specialist Appt Not Complete comments - pending medical stability -  [place] or Home Care Consult Complete Complete Complete  SW Recovery Care/Counseling Consult Complete Complete Complete  Palliative Care Screening Not Applicable Not Complete Not Applicable  Comments - may be appropriate, will ask MD -  [place] Not Applicable Patient Refused Not Applicable  Some recent data might be hidden

## 2020-08-19 NOTE — Progress Notes (Signed)
HR 40's, Seen by Dr. Marthenia Rolling, hold Metoprolol for now.

## 2020-08-19 NOTE — Progress Notes (Addendum)
Clarified order from Dr. Elsworth Soho. Removed chest tube today and do chest x ray tomorrow.

## 2020-08-19 NOTE — Progress Notes (Signed)
Pt is  Lethargic the whole shift.  Had HD today. Chest tube removed by SWOT RN. Wife and daughter in the room and wants the patient to be comfortable. Daughter mentioned about making him comfort care and wants pain medication for the pt. They also want to meet with   the Palliative Team again for [place]. Primary MD made aware, made orders.

## 2020-08-19 NOTE — Progress Notes (Signed)
ANTICOAGULATION CONSULT NOTE - Follow Up Consult  Pharmacy Consult for IV Heparin Indication: atrial fibrillation  Allergies  Allergen Reactions  . Metformin And Related Other (See Comments)    Has kidney problems    Patient Measurements: Height: 6' (182.9 cm) (from previous records) Weight: 62.4 kg (137 lb 9.1 oz) IBW/kg (Calculated) : 77.6  Heparin Dosing Weight: 63.9 kg  Vital Signs: Temp: 97.5 F (36.4 C) (04/01 2300) Temp Source: Axillary (04/01 2300) BP: 122/57 (04/02 0300) Pulse Rate: 48 (04/02 0300)  Labs: Recent Labs    08/17/20 0326 08/18/20 0416 08/19/20 0428  HGB 9.0* 9.1* 9.0*  HCT 29.7* 29.0* 29.6*  PLT 141* 121* 130*  HEPARINUNFRC 0.39 0.51 0.28*  CREATININE 4.13* 3.28*  --     Estimated Creatinine Clearance: 19.6 mL/min (A) (by C-G formula based on SCr of 3.28 mg/dL (H)).  Assessment: 65 y.o. male with ESRD (S/P failed renal transplant, on HD) on IV heparin for atrial fibrillation. Patient was taking Apixaban PTA for hx Afib >>apixaban held and bridging w/ Heparin due to HD access issues with clotted AV fistula. Temporary HD catheter placed at left IJ on 3/26. Possible vascular surgery consultation to assess permanent HD access.  Heparin level down to slightly subtherapeutic (0.28) on gtt at 1700 units/hr. No issues with line or bleeding reported per RN.  Goal of Therapy:  Heparin level 0.3-0.7 units/ml Monitor platelets by anticoagulation protocol: Yes   Plan:  Increase heparin to 1800 units/h Will f/u 8 hr heparin level  Sherlon Handing, PharmD, BCPS Please see amion for complete clinical pharmacist phone list 08/19/2020 5:30 AM

## 2020-08-19 NOTE — Progress Notes (Signed)
ANTICOAGULATION CONSULT NOTE - Follow Up Consult  Pharmacy Consult for IV Heparin Indication: atrial fibrillation  Allergies  Allergen Reactions  . Metformin And Related Other (See Comments)    Has kidney problems    Patient Measurements: Height: 6' (182.9 cm) (from previous records) Weight: 62.8 kg (138 lb 7.2 oz) IBW/kg (Calculated) : 77.6  Heparin Dosing Weight: 63.9 kg  Vital Signs: Temp: 97.5 F (36.4 C) (04/02 1313) Temp Source: Tympanic (04/02 1313) BP: 121/56 (04/02 1400) Pulse Rate: 46 (04/02 1400)  Labs: Recent Labs    08/17/20 0326 08/18/20 0416 08/19/20 0428 08/19/20 0940 08/19/20 1508  HGB 9.0* 9.1* 9.0*  --   --   HCT 29.7* 29.0* 29.6*  --   --   PLT 141* 121* 130*  --   --   HEPARINUNFRC 0.39 0.51 0.28*  --  0.30  CREATININE 4.13* 3.28*  --  3.97*  --     Estimated Creatinine Clearance: 16.3 mL/min (A) (by C-G formula based on SCr of 3.97 mg/dL (H)).  Assessment: 65 y.o. male with ESRD (S/P failed renal transplant, on HD) on IV heparin for atrial fibrillation. Patient was taking Apixaban PTA for hx Afib >>apixaban held and bridging w/ Heparin due to HD access issues with clotted AV fistula. Temporary HD catheter placed at left IJ on 3/26. Possible vascular surgery consultation to assess permanent HD access.  Heparin level at low end of goal (0.30) on drip rate of 1800 units/hr. No issues with line or bleeding noted. Will make small adjustment to keep with range.   Goal of Therapy:  Heparin level 0.3-0.7 units/ml Monitor platelets by anticoagulation protocol: Yes   Plan:  Increase heparin to 1900 units/h Daily heparin level and CBC  Erin Hearing PharmD., BCPS Clinical Pharmacist 08/19/2020 4:23 PM

## 2020-08-19 NOTE — Progress Notes (Signed)
PROGRESS NOTE    Paul [name]  KPT:[id number] DOB: Apr 28, 1956 DOA: 07/28/2020 PCP: [name], MD    Brief Narrative:  Paul [name] admitted to the hospital with undifferentiated shock, in the setting of large left pleural effusion.  65 year old male past medical history for end-stage renal disease on hemodialysis, failed renal transplant currently on immunosuppression, systolic heart failure, atrial fibrillation, duodenal ulcer status post Whipple 2002, osteomyelitis, peripheral vascular disease status post bilateral BKA, history of Covid infection01/2022,who presented with dyspnea.   He is a resident from a local nursing home, unfortunately he had missed2hemodialysis sessions due to inability to access right upper extremity fistula (suspected clotts). In the emergency department patient was hypotensive with blood pressure 75/53, heart rate 127, temperature 37.5, respiratory rate 23, oxygen saturation 100%, his lungs had diminished breath sounds on the left side, increased work of breathing, no wheezing, heart S1-S2, present, rhythmic, abdomen was soft and nontender, positive bilateral BKA. No rashes.  Sodium 139, potassium 4.8, chloride 102, bicarb 23, glucose 235, BUN 76, creatinine 6.79, AST 14, ALT 8, lactic acid 2.2, white count 6.5, hemoglobin 7.9, hematocrit 25.9, platelets 168. SARS COVID-19 negative.  Chest radiograph withlarge left pleural effusion.  EKG 130 bpm, normal axis, prolonged QTC 512, atrial fibrillation rhythm, no ST segment changes, Q-wave V1-V2, T wave inversions lead I, aVL, V4-V6,  Patient was placed to the intensive care unit, he was placed on vasopressors and underwent left-sided pigtail catheter placement. Initially received broad-spectrum IV antibiotic therapy  A temporary hemodialysis catheter was placed left IJ,and underwent hemodialysis.  Transferred to [facility] 3/29.  Loculated pleural effusion, sp TPA and dornase x 3, continue chest  tube to suction (-20 cm H20) with large output.   Still not able to use AV fistula, and using catheter for dialysis.   08/18/2020: Patient seen alongside patient's nurse.  Patient remains lethargic.  Patient responds to painful stimuli.  Discussed with patient's wife extensively.  Guarded prognosis.  Patient is hospice appropriate.  Will consult palliative care team. 08/19/2020: Patient seen alongside patient's nurse.  Patient underwent hemodialysis today.  Patient opens his eyes intermittently, otherwise, remains largely noncommunicative.  Prognosis remains guarded.  Goal of care will need to be addressed.  Assessment & Plan:   Principal Problem:   Hypotension Active Problems:   ESRD on hemodialysis (HCC)   DM (diabetes mellitus) ([place])   Atrial fibrillation (HCC)   Depression   S/P BKA (below knee amputation) bilateral (HCC)   Immunosuppression due to drug therapy ([place])   History of Whipple procedure   Heart failure with reduced ejection fraction ([place])   Chest tube in place   Pressure injury of skin    1. Shock undifferentiated, likely related to large left pleural effusion/ or vasoplegia. (ruled out septic shock) Midodrine has been discontinued with good toleration, blood pressure systolic 170 mmHg.   Continue left chest tube to large pleural effusion. Continue telemetry monitoring.  Continue to hold on midodrine for now. 08/19/2020: Midodrine has been discontinued.  Blood pressure is more stable.  Patient remains very ill.  2. Large left loculated exudative pleural effusion/ acute hypoxemic respiratory failure. Positive right pleural effusion.  Chest tube to suction (-20 cm H20) with no air leak, continue to have high out put 1,240 ml, now SP tpa and dornase x3.  Follow up chest film today with bilateral pleural effusions more right than left, chest tube in place with no pneumothorax.   On ultrafiltration with hemodialysis.   Output needs to be  less than 100 ml per day in  order to remove chest tube. 08/19/2020: Pulmonary team is assisting with management of the loculated pleural effusion.  3. ERSD on HD/ failed renal transplant. Has a temporary catheter at the left IJ.   Not able to use right upper extremity fistula, continue with HD per nephrology recommendations.  Possible vascular surgery consultation to assess permanent HD access.  Continue with immunosuppressive therapy (prednisone and tacrolimus). 08/19/2020: Patient underwent hemodialysis today.  Patient remains on Prograf on prednisone.  4. Chronic diastolic heart failure/ atrial fibrillation. Rate control with metoprolol, and continue anticoagulation with heparin, until more stable HD access in place.   5. Severe protein calorie malnutrition/ stage 2 pressure ulcer at the sacrum (present on admission). Continue with nutritional supplements.   Continue with skin care per protocol.   6. PVD. Sp BKA bilaterally. Poor very poor mobility.   7. T2DM hypoglycemia.  Post HD capillary glucose is 69, add amp of D50 IV, follow up 98. Discontinue sliding scale insulin to prevent recurrent hypoglycemia.   8. Depression, Continue with sertraline.  Status is: Inpatient  Remains inpatient appropriate because:IV treatments appropriate due to intensity of illness or inability to take PO   Dispo: The patient is from: SNF              Anticipated d/c is to: SNF              Patient currently is not medically stable to d/c.   Difficult to place patient No   DVT prophylaxis:  heparin   Code Status:   dnr   Family Communication:  No family at the bedside      Nutrition Status: Nutrition Problem: Increased nutrient needs Etiology: chronic illness (ESRD on HD) Signs/Symptoms: estimated needs Interventions: Nepro shake,MVI     Consultants:   Nephrology   Pulmonary   Procedures:   Left chest tube and left IJ HD cathter.      Subjective: No significant history from patient. Patient  opens his eyes intermittently.    Objective: Vitals:   08/19/20 1313 08/19/20 1400 08/19/20 1500 08/19/20 1600  BP: (!) 103/44 (!) 121/56 (!) 107/51 121/61  Pulse: (!) 48 (!) 46  (!) 49  Resp: (!) 21 18  (!) 23  Temp: (!) 97.5 F (36.4 C)     TempSrc: Tympanic     SpO2:  98%  100%  Weight: 62.8 kg     Height:        Intake/Output Summary (Last 24 hours) at 08/19/2020 1827 Last data filed at 08/19/2020 1313 Gross per 24 hour  Intake 0 ml  Output 25 ml  Net -25 ml   Filed Weights   08/19/20 0412 08/19/20 0910 08/19/20 1313  Weight: 62.4 kg 63 kg 62.8 kg    Examination:   General: Opens his eyes intermittently.  Responds to painful stimuli.  Neurology: Responds to painful stimuli.  Remains encephalopathic. E ENT: mild pallor, no icterus, oral mucosa moist Cardiovascular: No JVD. S1-S2 present, rhythmic, no gallops, rubs, or murmurs. No lower extremity edema. Pulmonary: positive breath sounds bilaterally, with no wheezing, decreased breath sounds bilateral at bases.  Gastrointestinal. Abdomen soft and non tender Skin. No rashes Musculoskeletal: bilateral BKA.      Data Reviewed: I have personally reviewed following labs and imaging studies  CBC: Recent Labs  Lab 08/15/20 0244 08/16/20 0110 08/17/20 0326 08/18/20 0416 08/19/20 0428  WBC 8.9 8.3 10.3 17.0* 17.2*  HGB 8.3* 8.5* 9.0* 9.1* 9.0*  HCT 26.6* 27.7* 29.7* 29.0* 29.6*  MCV 96.7 97.5 99.3 96.3 98.7  PLT 182 116* 141* 121* 728*   Basic Metabolic Panel: Recent Labs  Lab 08/15/20 0244 08/16/20 0110 08/17/20 0326 08/18/20 0416 08/19/20 0940  NA 135 134* 135 138 133*  K 3.9 3.5 3.4* 3.8 3.7  CL 100 99 101 100 99  CO2 26 26 23  17* 16*  GLUCOSE 198* 217* 114* 119* 202*  BUN 65* 44* 62* 39* 54*  CREATININE 4.99* 3.56* 4.13* 3.28* 3.97*  CALCIUM 8.1* 7.7* 7.9* 8.6* 7.7*  PHOS  --   --   --   --  6.4*   GFR: Estimated Creatinine Clearance: 16.3 mL/min (A) (by C-G formula based on SCr of 3.97 mg/dL  (H)). Liver Function Tests: Recent Labs  Lab 08/19/20 0940  ALBUMIN 1.5*   No results for input(s): LIPASE, AMYLASE in the last 168 hours. No results for input(s): AMMONIA in the last 168 hours. Coagulation Profile: No results for input(s): INR, PROTIME in the last 168 hours. Cardiac Enzymes: No results for input(s): CKTOTAL, CKMB, CKMBINDEX, TROPONINI in the last 168 hours. BNP (last 3 results) No results for input(s): PROBNP in the last 8760 hours. HbA1C: No results for input(s): HGBA1C in the last 72 hours. CBG: Recent Labs  Lab 08/18/20 1632 08/18/20 2053 08/19/20 0002 08/19/20 0435 08/19/20 0809  GLUCAP 168* 192* 182* 194* 196*   Lipid Profile: No results for input(s): CHOL, HDL, LDLCALC, TRIG, CHOLHDL, LDLDIRECT in the last 72 hours. Thyroid Function Tests: No results for input(s): TSH, T4TOTAL, FREET4, T3FREE, THYROIDAB in the last 72 hours. Anemia Panel: No results for input(s): VITAMINB12, FOLATE, FERRITIN, TIBC, IRON, RETICCTPCT in the last 72 hours.    Radiology Studies: I have reviewed all of the imaging during this hospital visit personally     Scheduled Meds: . Chlorhexidine Gluconate Cloth  6 each Topical Q0600  . Chlorhexidine Gluconate Cloth  6 each Topical Q0600  . feeding supplement (NEPRO CARB STEADY)  237 mL Oral TID BM  . lipase/protease/amylase  12,000 Units Oral TID AC  . metoprolol tartrate  25 mg Oral BID  . multivitamin  1 tablet Oral QHS  . pantoprazole  40 mg Oral Daily  . predniSONE  5 mg Oral Q breakfast  . sertraline  50 mg Oral QHS  . sodium chloride flush  10 mL Intracatheter Q8H  . sodium chloride flush  10-40 mL Intracatheter Q12H  . tacrolimus  2 mg Oral BID   Continuous Infusions: . heparin 1,900 Units/hr (08/19/20 1804)     LOS: 8 days        Bonnell Public, MD

## 2020-08-19 NOTE — Progress Notes (Signed)
[facility] ROUNDING NOTE   Subjective:   Brief history: 65 year old end-stage renal disease status post failed transplant.  History of systolic heart failure, atrial fibrillation duodenal ulcer status post Whipple's procedure 2002.  History of osteomyelitis peripheral vascular status post bilateral BKA.  Admitted with Covid pneumonia 05/2020.  Residence of a local nursing home.  Was admitted 08/13/2020 with hypotension and shock with large left loculated exudative pleural effusion and hypoxic respiratory failure.  Underwent chest tube placement.  He receives dialysis through a left IJ catheter due to clotted AV fistula.  He receives dialysis on a Tuesday Thursday Saturday schedule.  He underwent successful dialysis on 08/17/2020.  He is currently undergoing dialysis 08/19/2020   Blood pressure 120/ 66 pulse 46 temperature 98 O2 sats 100% 3 L nasal cannula  Labs pending   IV heparin  Prednisone 5 mg daily Zoloft 50 mg daily, Creon, Lopressor 25 mg twice daily, Protonix 40 mg daily, tacrolimus 2 mg twice daily   Objective:  Vital signs in last 24 hours:  Temp:  [97.5 F (36.4 C)-98 F (36.7 C)] 98 F (36.7 C) (04/02 0915) Pulse Rate:  [46-93] 46 (04/02 0930) Resp:  [15-21] 17 (04/02 0930) BP: (115-149)/(52-79) 125/66 (04/02 0930) SpO2:  [94 %-100 %] 100 % (04/02 0855) Weight:  [62.4 kg-63 kg] 63 kg (04/02 0910)  Weight change: -0.9 kg Filed Weights   08/18/20 0358 08/19/20 0412 08/19/20 0910  Weight: 63.2 kg 62.4 kg 63 kg    Intake/Output: I/O last 3 completed shifts: In: 326.3 [I.V.:326.3] Out: 45 [Chest Tube:45]   Intake/Output this shift:  No intake/output data recorded.  GEN: chronically ill appearing, lying in bed in nad ENT: no nasal discharge, mmm EYES: no scleral icterus, eomi CV: tachycardia, irregular rhythm PULM: no iwob, bilateral chest rise ABD: NABS, non-distended SKIN: no rashes or jaundice EXT: no edema, warm and well perfused RUE AVF:  Thrill present, minimal pulsatility, highly collapsable   Basic Metabolic Panel: Recent Labs  Lab 08/14/20 0432 08/15/20 0244 08/16/20 0110 08/17/20 0326 08/18/20 0416  NA 135 135 134* 135 138  K 3.9 3.9 3.5 3.4* 3.8  CL 101 100 99 101 100  CO2 26 26 26 23  17*  GLUCOSE 124* 198* 217* 114* 119*  BUN 48* 65* 44* 62* 39*  CREATININE 4.46* 4.99* 3.56* 4.13* 3.28*  CALCIUM 8.1* 8.1* 7.7* 7.9* 8.6*    Liver Function Tests: No results for input(s): AST, ALT, ALKPHOS, BILITOT, PROT, ALBUMIN in the last 168 hours. No results for input(s): LIPASE, AMYLASE in the last 168 hours. No results for input(s): AMMONIA in the last 168 hours.  CBC: Recent Labs  Lab 08/15/20 0244 08/16/20 0110 08/17/20 0326 08/18/20 0416 08/19/20 0428  WBC 8.9 8.3 10.3 17.0* 17.2*  HGB 8.3* 8.5* 9.0* 9.1* 9.0*  HCT 26.6* 27.7* 29.7* 29.0* 29.6*  MCV 96.7 97.5 99.3 96.3 98.7  PLT 182 116* 141* 121* 130*    Cardiac Enzymes: No results for input(s): CKTOTAL, CKMB, CKMBINDEX, TROPONINI in the last 168 hours.  BNP: Invalid input(s): POCBNP  CBG: Recent Labs  Lab 08/18/20 1632 08/18/20 2053 08/19/20 0002 08/19/20 0435 08/19/20 0809  GLUCAP 168* 192* 182* 194* 196*    Microbiology: Results for orders placed or performed during the hospital encounter of 08/07/2020  Blood culture (routine single)     Status: None   Collection Time: 07/26/2020  1:28 PM   Specimen: BLOOD LEFT ARM  Result Value Ref Range Status   Specimen Description BLOOD LEFT  ARM  Final   Special Requests   Final    BOTTLES DRAWN AEROBIC AND ANAEROBIC Blood Culture results may not be optimal due to an inadequate volume of blood received in culture bottles   Culture   Final    NO GROWTH 5 DAYS Performed at [facility], [place] [address], [place], [place] [address]    Report Status 08/16/2020 FINAL  Final  MRSA PCR Screening     Status: None   Collection Time: 07/26/2020  4:08 PM   Specimen: Nasopharyngeal  Result Value Ref  Range Status   MRSA by PCR NEGATIVE NEGATIVE Final    Comment:        The GeneXpert MRSA Assay (FDA approved for NASAL specimens only), is one component of a comprehensive MRSA colonization surveillance program. It is not intended to diagnose MRSA infection nor to guide or monitor treatment for MRSA infections. Performed at [facility], [place] [address] [place], [place] [address]   Body fluid culture w Gram Stain     Status: None   Collection Time: 07/24/2020  4:44 PM   Specimen: Body Fluid  Result Value Ref Range Status   Specimen Description FLUID PLEURAL LEFT  Final   Special Requests NONE  Final   Gram Stain   Final    RARE WBC PRESENT, PREDOMINANTLY MONONUCLEAR NO ORGANISMS SEEN    Culture   Final    NO GROWTH 3 DAYS Performed at [facility], [address], [place], [place] [address]    Report Status 08/15/2020 FINAL  Final  Culture, blood (routine x 2)     Status: None   Collection Time: 08/16/2020  5:57 PM   Specimen: BLOOD  Result Value Ref Range Status   Specimen Description BLOOD THUMB LEFT  Final   Special Requests   Final    BOTTLES DRAWN AEROBIC ONLY Blood Culture results may not be optimal due to an inadequate volume of blood received in culture bottles   Culture   Final    NO GROWTH 5 DAYS Performed at [facility], [place] [address], [place], [place] [address]    Report Status 08/16/2020 FINAL  Final  Culture, blood (routine x 2)     Status: None   Collection Time: 08/10/2020  6:04 PM   Specimen: BLOOD LEFT HAND  Result Value Ref Range Status   Specimen Description BLOOD LEFT HAND  Final   Special Requests   Final    BOTTLES DRAWN AEROBIC ONLY Blood Culture results may not be optimal due to an inadequate volume of blood received in culture bottles   Culture   Final    NO GROWTH 5 DAYS Performed at [facility], [place] [address] [name],  [address]    Report Status 08/16/2020 FINAL  Final    Coagulation Studies: No results  for input(s): LABPROT, INR in the last 72 hours.  Urinalysis: No results for input(s): COLORURINE, LABSPEC, PHURINE, GLUCOSEU, HGBUR, BILIRUBINUR, KETONESUR, PROTEINUR, UROBILINOGEN, NITRITE, LEUKOCYTESUR in the last 72 hours.  Invalid input(s): APPERANCEUR    Imaging: DG Chest 1 View  Result Date: 08/18/2020 CLINICAL DATA:  Chest tube present.  Pleural effusions. EXAM: CHEST  1 VIEW COMPARISON:  August 17, 2020 FINDINGS: Central catheter tip is at the junction of the left innominate vein and superior vena cava. Chest tube present in the inferolateral left base region. No pneumothorax evident currently. There are pleural effusions bilaterally, larger on the right than  on the left. There is atelectatic change in the base regions, similar to 1 day prior. No new opacity. Heart mildly enlarged with pulmonary venous hypertension. No adenopathy. There is aortic atherosclerosis. No bone lesions. IMPRESSION: Tube and catheter positions as described without evident pneumothorax. Pleural effusions bilaterally, larger on the right than the left with bibasilar atelectasis, stable. Stable cardiac prominence. Aortic Atherosclerosis (ICD10-I70.0). Electronically Signed   By: Lowella Grip III M.D.   On: 08/18/2020 08:11     Medications:   . sodium chloride    . sodium chloride    . heparin 1,800 Units/hr (08/19/20 0534)   . Chlorhexidine Gluconate Cloth  6 each Topical Q0600  . Chlorhexidine Gluconate Cloth  6 each Topical Q0600  . feeding supplement (NEPRO CARB STEADY)  237 mL Oral TID BM  . lipase/protease/amylase  12,000 Units Oral TID AC  . metoprolol tartrate  25 mg Oral BID  . multivitamin  1 tablet Oral QHS  . pantoprazole  40 mg Oral Daily  . predniSONE  5 mg Oral Q breakfast  . sertraline  50 mg Oral QHS  . sodium chloride flush  10 mL Intracatheter Q8H  . sodium chloride flush  10-40 mL Intracatheter Q12H  . tacrolimus  2 mg Oral BID   sodium chloride, sodium chloride, alteplase,  dextrose, docusate sodium, heparin, heparin, lidocaine (PF), lidocaine-prilocaine, metoprolol tartrate, pentafluoroprop-tetrafluoroeth, polyethylene glycol, sodium chloride flush  Assessment/ Plan:  OP HD: [facility]     3.5h  400/500   66kg  3K/2.5Ca bath  AVF  Hep none  - calcitriol 1 tiw  - venofer 100 x 5 , started 3/19  - mircera 100 q2 (last dose during prior admit)   Assessment/ Plan: 1. Hypotension - suspected septic shock. Recent admission w Enterococcus bacteremia. Large left pleural effusion on CXR. SP chest tube placement w/ ^^drainage. NG on cultures at this time. Hypotension greatly improved. 2. AFib RVR --Usual meds Eliquis, metoprolol as tolerated w/ sepsis. On heparin currently 3. ESRD -HD TTS. Maintain schedule for now.  Currently receiving dialysis 08/19/2020 4. RUE AVF issues: Missed HD x 2 this d/t clotted access. S/p declot at [facility] 3/24.  There was concern that his access had clotted again but this was likely due to hypotension and highly collapsible AVF demonstrating minimal flow.  Temp cath placed and had HD Sat 3/26 afternoon. . Consider consultation with vein and vascular surgery 5. Hypotension/volume - shock resolved. Appears euvolemic 6. Anemia - Continue outpatient ESA. Holding iron given recent infection 7. Metabolic bone disease -Continue Auryixa binders/VDRA 8. T1DM - Insulin per primary    LOS: [place] @TODAY @9 :52 AM

## 2020-08-19 NOTE — Progress Notes (Signed)
Received from HD, pt drowsy , Heparin infusing @18  ml/hr.

## 2020-08-19 NOTE — Progress Notes (Signed)
NAME:  Paul [name], MRN:  [id number], DOB:  1955-08-04, LOS: 8 ADMISSION DATE:  08/12/2020, CONSULTATION DATE:  08/01/2020 REFERRING MD:  Ron Parker- EM, CHIEF COMPLAINT:  SOB  History of Present Illness:  65 yo M extensive PMH including ESRD s/p failed renal transplant - on THSa iHD, chronic immunosuppression, systolic heart failure, Afib on eliquis, duodenal cancer s/p whipple (2002), osteomyelitis and critical limb ischemia s/p Bilat BKAs, COVID, recent enterococcus bacteremia requiring hospitalization, who presented to ED 3/25 from [facility] home with SOB. SOB has increased over the past week -- associated with missing HD x3 due to inability to access the patient's RUE AVF.  Pt hypotensive in ED with SBPs 70s-- received small IVF bolus and started on levo. CXR revealed large L pleural effusion. CMP obtained which did not reveal any absolute indications for emergent HD. CBC with WBC 6.5, hgb 7.9, plt 168. LA 2.2  -Chest tube placed to manage effusion -TPA instilled into chest tube x3  Pertinent  Medical History  Afib on eliquis Duodenal cancer s/p whipple ESRD Failed renal transplant Chronic immunosuppression  DM Gangrene Osteomyelitis Critical limb ischemia calciphylaxis S/p bilateral BKAs  HTN  COVID Enterococcus bacteremia  Combined systolic and diastolic heart failure, EF 25-30% Pleural effusion  IDDM  Significant Hospital Events: Including procedures, antibiotic start and stop dates in addition to other pertinent events   . 3/25 presented to ED from nursing home with SOB after multiple missed HD runs due to inability to access fistula. Hypotensive in ED, started on pressors. CXR with large L pleural effusion. Blood cx sent. Started on empiric abx for possible sepsis. Admitting to ICU, temp HD and left pigtail placed; lymphocytic predominant fluid . 3/26: HD . 3/26 TTE >LVEF 60-65%, mild LVH, RV normal, LA dilated, RA dilated, small pericardial effusion . 3/28 CXR  >> No improvement in pleural effusion . 3/28: tPA and dornase . 3/29: tPA and dornase . 3/30: tPA and dornase . 3/31 CXR > 80cc out during day shift, minimal change in CXR  Interim History / Subjective:   Examined on dialysis. Lethargic but wakes up easily Denies pain, no dyspnea  Objective   Blood pressure (!) 107/48, pulse (!) 49, temperature 98 F (36.7 C), temperature source Axillary, resp. rate 19, height 6' (1.829 m), weight 63 kg, SpO2 100 %.        Intake/Output Summary (Last 24 hours) at 08/19/2020 1051 Last data filed at 08/19/2020 0600 Gross per 24 hour  Intake 326.3 ml  Output 35 ml  Net 291.3 ml   Filed Weights   08/18/20 0358 08/19/20 0412 08/19/20 0910  Weight: 63.2 kg 62.4 kg 63 kg    Examination: Constitutional: Chronically ill-appearing , lying supine, no distress  HEENT: Dry oral mucosa, anicteric Neck: Left IJ HD catheter in place Cardiovascular: S1-S2 normal, no rub Respiratory: Decreased breath sounds on right, clear on left Gastrointestinal: Bowel sounds appreciated Skin: Warm and dry Neurologic: Lethargic, answers questions, interactive when aroused Bilateral BKA  Chest tube output 45 cc last 24 hours  Chest x-ray 4/1 independently reviewed, no pneumothorax, appearance on 3/31 chest x-raylikely skinfold, larger layering right effusion, small left Labs/imaging that I have personally reviewed  (right click and "Reselect all SmartList Selections" daily)  Blood culture 3/25-no growth Pleural fluid culture 3/25-no growth  Pleural fluid cytology-negative for malignancy  Resolved Hospital Problem list    Assessment & Plan:   Acute respiratory failure with hypoxemia Bilateral pleural effusion Lymphocyte predominant, cytology negative transudate  on left -Discontinue chest tube, chest x-ray tomorrow -Large right effusion, seems to have developed after hospitalization, attempt fluid removal with dialysis, thoracentesis only if his breathing gets  worse  End-stage renal disease on hemodialysis, s/p failed transplant on immunosuppressants Being dialyzed via right forearm AV fistula .  Euvolemic .  Management per renal/TRH -left IJ HD catheter can be discontinued if not required  .  Rest of care per primary  DNR noted   Kara Mead MD. [facility]. [place] [facility] Pager : [phone]  If no response to pager , please call [phone] until 7 pm After 7:00 pm call Elink  [phone]   08/19/2020

## 2020-08-20 ENCOUNTER — Inpatient Hospital Stay (HOSPITAL_COMMUNITY): Payer: Medicare Other

## 2020-08-20 DIAGNOSIS — I9589 Other hypotension: Secondary | ICD-10-CM | POA: Diagnosis not present

## 2020-08-20 DIAGNOSIS — J90 Pleural effusion, not elsewhere classified: Secondary | ICD-10-CM | POA: Diagnosis not present

## 2020-08-20 LAB — CBC
HCT: 27.3 % — ABNORMAL LOW (ref 39.0–52.0)
Hemoglobin: 8.3 g/dL — ABNORMAL LOW (ref 13.0–17.0)
MCH: 30.2 pg (ref 26.0–34.0)
MCHC: 30.4 g/dL (ref 30.0–36.0)
MCV: 99.3 fL (ref 80.0–100.0)
Platelets: 111 10*3/uL — ABNORMAL LOW (ref 150–400)
RBC: 2.75 MIL/uL — ABNORMAL LOW (ref 4.22–5.81)
RDW: 19 % — ABNORMAL HIGH (ref 11.5–15.5)
WBC: 16 10*3/uL — ABNORMAL HIGH (ref 4.0–10.5)
nRBC: 0.5 % — ABNORMAL HIGH (ref 0.0–0.2)

## 2020-08-20 LAB — GLUCOSE, CAPILLARY
Glucose-Capillary: 138 mg/dL — ABNORMAL HIGH (ref 70–99)
Glucose-Capillary: 153 mg/dL — ABNORMAL HIGH (ref 70–99)
Glucose-Capillary: 154 mg/dL — ABNORMAL HIGH (ref 70–99)
Glucose-Capillary: 158 mg/dL — ABNORMAL HIGH (ref 70–99)
Glucose-Capillary: 165 mg/dL — ABNORMAL HIGH (ref 70–99)
Glucose-Capillary: 168 mg/dL — ABNORMAL HIGH (ref 70–99)

## 2020-08-20 LAB — HEPARIN LEVEL (UNFRACTIONATED)
Heparin Unfractionated: 0.27 IU/mL — ABNORMAL LOW (ref 0.30–0.70)
Heparin Unfractionated: 0.22 IU/mL — ABNORMAL LOW (ref 0.30–0.70)
Heparin Unfractionated: 0.24 IU/mL — ABNORMAL LOW (ref 0.30–0.70)

## 2020-08-29 ENCOUNTER — Ambulatory Visit: Payer: Medicare Other | Admitting: Cardiovascular Disease

## 2020-09-17 NOTE — Progress Notes (Signed)
NAME:  Paul [name], MRN:  [id number], DOB:  12-Jun-1955, LOS: 9 ADMISSION DATE:  07/22/2020, CONSULTATION DATE:  07/20/2020 REFERRING MD:  Ron Parker- EM, CHIEF COMPLAINT:  SOB  History of Present Illness:  65 yo M extensive PMH including ESRD s/p failed renal transplant - on THSa iHD, chronic immunosuppression, systolic heart failure, Afib on eliquis, duodenal cancer s/p whipple (2002), osteomyelitis and critical limb ischemia s/p Bilat BKAs, COVID, recent enterococcus bacteremia requiring hospitalization, who presented to ED 3/25 from [place] home with SOB. SOB has increased over the past week -- associated with missing HD x3 due to inability to access the patient's RUE AVF.  Pt hypotensive in ED with SBPs 70s-- received small IVF bolus and started on levo. CXR revealed large L pleural effusion. CMP obtained which did not reveal any absolute indications for emergent HD. CBC with WBC 6.5, hgb 7.9, plt 168. LA 2.2  -Chest tube placed to manage effusion -TPA instilled into chest tube x3  Pertinent  Medical History  Afib on eliquis Duodenal cancer s/p whipple ESRD Failed renal transplant Chronic immunosuppression  DM Gangrene Osteomyelitis Critical limb ischemia calciphylaxis S/p bilateral BKAs  HTN  COVID Enterococcus bacteremia  Combined systolic and diastolic heart failure, EF 25-30% Pleural effusion  IDDM  Significant Hospital Events: Including procedures, antibiotic start and stop dates in addition to other pertinent events   . 3/25 presented to ED from nursing home with SOB after multiple missed HD runs due to inability to access fistula. Hypotensive in ED, started on pressors. CXR with large L pleural effusion. Blood cx sent. Started on empiric abx for possible sepsis. Admitting to ICU, temp HD and left pigtail placed; lymphocytic predominant fluid . 3/26: HD . 3/26 TTE >LVEF 60-65%, mild LVH, RV normal, LA dilated, RA dilated, small pericardial effusion . 3/28: tPA  and dornase . 3/29: tPA and dornase . 3/30: tPA and dornase . 3/31 CXR > 80cc out during day shift, minimal change in CXR  Interim History / Subjective:   Afebrile Wife at bedside  does not offer any complaints  Objective   Blood pressure 97/77, pulse (!) 56, temperature 97.8 F (36.6 C), temperature source Axillary, resp. rate 20, height 6' (1.829 m), weight 62.7 kg, SpO2 100 %.        Intake/Output Summary (Last 24 hours) at 09-18-2020 1401 Last data filed at 09-18-2020 1000 Gross per 24 hour  Intake 815.69 ml  Output --  Net 815.69 ml   Filed Weights   08/19/20 0910 08/19/20 1313 09-18-20 0403  Weight: 63 kg 62.8 kg 62.7 kg    Examination: Constitutional: Chronically ill-appearing , lying supine, Groaning intermittently, supine in bed,  HEENT: Dry oral mucosa, anicteric Neck: Left IJ HD catheter in place Cardiovascular: S1-S2 normal, no rub Respiratory: Decreased breath sounds on right, clear on left Gastrointestinal: Bowel sounds appreciated Skin: Warm and dry Neurologic: Lethargic, follows one-step commands when aroused Bilateral BKA    Chest x-ray 4/3 independently reviewed, no pneumothorax, layering moderate right effusion Left small effusion Labs/imaging that I have personally reviewed  (right click and "Reselect all SmartList Selections" daily)  Blood culture 3/25-no growth Pleural fluid culture 3/25-no growth  Pleural fluid cytology-negative for malignancy  Resolved Hospital Problem list    Assessment & Plan:   Acute respiratory failure with hypoxemia Bilateral pleural effusion Lymphocyte predominant, cytology negative transudate on left -Large right effusion, seems to have developed after hospitalization, attempt fluid removal with dialysis, would not offer further thoracentesis in  context of overall goals of care  End-stage renal disease on hemodialysis, s/p failed transplant on immunosuppressants Being dialyzed via right forearm AV fistula .   Euvolemic .  Management per renal/TRH -left IJ HD catheter can be discontinued if not required  .  Rest of care per primary  DNR noted , wife is leaning towards comfort care now.  PCCM will be available as needed   Kara Mead MD. FCCP. [facility] Pager : [phone]  If no response to pager , please call [phone] until 7 pm After 7:00 pm call Elink  [phone]   09-06-2020

## 2020-09-17 NOTE — Progress Notes (Signed)
Paul [name]   Subjective:   Brief history: 65 year old end-stage renal disease status post failed transplant.  History of systolic heart failure, atrial fibrillation duodenal ulcer status post Whipple's procedure 2002.  History of osteomyelitis peripheral vascular status post bilateral BKA.  Admitted with Covid pneumonia 05/2020.  Residence of a local nursing home.  Was admitted 08/03/2020 with hypotension and shock with large left loculated exudative pleural effusion and hypoxic respiratory failure.  Underwent chest tube placement.  He receives dialysis through a left IJ catheter due to clotted AV fistula.  He receives dialysis on a Tuesday Thursday Saturday schedule.  He underwent successful dialysis on 08/19/2020 his next dialysis will be 08/22/2020   Blood pressure 117/54 pulse 51 temperature 97.8 O2 sats 97% room air  Labs sodium 133 potassium 3.7 chloride 99 CO2 16 BUN 34 creatinine 3.97 glucose 202 calcium 7.7 phosphorus 6.4 albumin 1 5 hemoglobin 8.3  IV heparin  Prednisone 5 mg daily Zoloft 50 mg daily, Creon, Lopressor 25 mg twice daily, Protonix 40 mg daily, tacrolimus 2 mg twice daily   Objective:  Vital signs in last 24 hours:  Temp:  [97.5 F (36.4 C)-98 F (36.7 C)] 97.8 F (36.6 C) (04/03 0740) Pulse Rate:  [44-52] 51 (04/03 0740) Resp:  [12-25] 15 (04/03 0740) BP: (97-138)/(42-66) 117/57 (04/03 0740) SpO2:  [96 %-100 %] 97 % (04/03 0740) Weight:  [62.7 kg-62.8 kg] 62.7 kg (04/03 0403)  Weight change: 0.6 kg Filed Weights   08/19/20 0910 08/19/20 1313 Sep 05, 2020 0403  Weight: 63 kg 62.8 kg 62.7 kg    Intake/Output: I/O last 3 completed shifts: In: 0  Out: 25 [Chest Tube:25]   Intake/Output this shift:  No intake/output data recorded.  GEN: chronically ill appearing, lying in bed in nad ENT: no nasal discharge, mmm EYES: no scleral icterus, eomi CV: tachycardia, irregular rhythm PULM: no iwob, bilateral chest rise ABD: NABS,  non-distended SKIN: no rashes or jaundice EXT: no edema, warm and well perfused RUE AVF: Thrill present, minimal pulsatility, highly collapsable   Basic Metabolic Panel: Recent Labs  Lab 08/15/20 0244 08/16/20 0110 08/17/20 0326 08/18/20 0416 08/19/20 0940  NA 135 134* 135 138 133*  K 3.9 3.5 3.4* 3.8 3.7  CL 100 99 101 100 99  CO2 26 26 23  17* 16*  GLUCOSE 198* 217* 114* 119* 202*  BUN 65* 44* 62* 39* 54*  CREATININE 4.99* 3.56* 4.13* 3.28* 3.97*  CALCIUM 8.1* 7.7* 7.9* 8.6* 7.7*  PHOS  --   --   --   --  6.4*    Liver Function Tests: Recent Labs  Lab 08/19/20 0940  ALBUMIN 1.5*   No results for input(s): LIPASE, AMYLASE in the last 168 hours. No results for input(s): AMMONIA in the last 168 hours.  CBC: Recent Labs  Lab 08/16/20 0110 08/17/20 0326 08/18/20 0416 08/19/20 0428 2020/09/05 0343  WBC 8.3 10.3 17.0* 17.2* 16.0*  HGB 8.5* 9.0* 9.1* 9.0* 8.3*  HCT 27.7* 29.7* 29.0* 29.6* 27.3*  MCV 97.5 99.3 96.3 98.7 99.3  PLT 116* 141* 121* 130* 111*    Cardiac Enzymes: No results for input(s): CKTOTAL, CKMB, CKMBINDEX, TROPONINI in the last 168 hours.  BNP: Invalid input(s): POCBNP  CBG: Recent Labs  Lab 08/19/20 0809 08/19/20 1955 09/05/20 0020 2020/09/05 0400 2020-09-05 0731  GLUCAP 196* 129* 138* 154* 168*    Microbiology: Results for orders placed or performed during the hospital encounter of 08/03/2020  Blood culture (routine single)  Status: None   Collection Time: 08/10/2020  1:28 PM   Specimen: BLOOD LEFT ARM  Result Value Ref Range Status   Specimen Description BLOOD LEFT ARM  Final   Special Requests   Final    BOTTLES DRAWN AEROBIC AND ANAEROBIC Blood Culture results may not be optimal due to an inadequate volume of blood received in culture bottles   Culture   Final    NO GROWTH 5 DAYS Performed at [facility], [place] [address], [place], [place] [address]    Report Status 08/16/2020 FINAL  Final  MRSA PCR Screening     Status:  None   Collection Time: 07/18/2020  4:08 PM   Specimen: Nasopharyngeal  Result Value Ref Range Status   MRSA by PCR NEGATIVE NEGATIVE Final    Comment:        The GeneXpert MRSA Assay (FDA approved for NASAL specimens only), is one component of a comprehensive MRSA colonization surveillance program. It is not intended to diagnose MRSA infection nor to guide or monitor treatment for MRSA infections. Performed at [facility], [place] [address] [place], [place] [address]   Body fluid culture w Gram Stain     Status: None   Collection Time: 07/18/2020  4:44 PM   Specimen: Body Fluid  Result Value Ref Range Status   Specimen Description FLUID PLEURAL LEFT  Final   Special Requests NONE  Final   Gram Stain   Final    RARE WBC PRESENT, PREDOMINANTLY MONONUCLEAR NO ORGANISMS SEEN    Culture   Final    NO GROWTH 3 DAYS Performed at [facility], [address] [place], [place] [address]    Report Status 08/15/2020 FINAL  Final  Culture, blood (routine x 2)     Status: None   Collection Time: 08/05/2020  5:57 PM   Specimen: BLOOD  Result Value Ref Range Status   Specimen Description BLOOD THUMB LEFT  Final   Special Requests   Final    BOTTLES DRAWN AEROBIC ONLY Blood Culture results may not be optimal due to an inadequate volume of blood received in culture bottles   Culture   Final    NO GROWTH 5 DAYS Performed at [facility], [place] [address] [place], [place] [address]    Report Status 08/16/2020 FINAL  Final  Culture, blood (routine x 2)     Status: None   Collection Time: 07/19/2020  6:04 PM   Specimen: BLOOD LEFT HAND  Result Value Ref Range Status   Specimen Description BLOOD LEFT HAND  Final   Special Requests   Final    BOTTLES DRAWN AEROBIC ONLY Blood Culture results may not be optimal due to an inadequate volume of blood received in culture bottles   Culture   Final    NO GROWTH 5 DAYS Performed at [facility], [place] [address], [place],  [place] [address]    Report Status 08/16/2020 FINAL  Final    Coagulation Studies: No results for input(s): LABPROT, INR in the last 72 hours.  Urinalysis: No results for input(s): COLORURINE, LABSPEC, PHURINE, GLUCOSEU, HGBUR, BILIRUBINUR, KETONESUR, PROTEINUR, UROBILINOGEN, NITRITE, LEUKOCYTESUR in the last 72 hours.  Invalid input(s): APPERANCEUR    Imaging: DG Chest Port 1 View  Result Date: 08-27-20 CLINICAL DATA:  Shortness of breath/unresponsive. EXAM: PORTABLE CHEST 1 VIEW COMPARISON:  08/18/2020 FINDINGS: Left IJ central venous catheter unchanged. Patient is slightly rotated to the left. Lungs are  somewhat hypoinflated with stable left base opacification likely small to moderate effusion with associated basilar atelectasis. Stable hazy opacification over the right lung likely layering moderate effusion with associated basilar atelectasis. No pneumothorax. Skin fold projects over the right mid to upper lung. Cardiomediastinal silhouette and remainder of the exam is unchanged. IMPRESSION: 1. Stable hazy opacification over the right lung likely moderate size effusion with associated basilar atelectasis. Stable left base opacification likely small to moderate effusion with associated atelectasis. 2. Left IJ central venous catheter unchanged. Electronically Signed   By: Marin Olp M.D.   On: 09-09-20 08:48     Medications:   . heparin 2,000 Units/hr (September 09, 2020 0452)   . Chlorhexidine Gluconate Cloth  6 each Topical Q0600  . Chlorhexidine Gluconate Cloth  6 each Topical Q0600  . feeding supplement (NEPRO CARB STEADY)  237 mL Oral TID BM  . lipase/protease/amylase  12,000 Units Oral TID AC  . metoprolol tartrate  25 mg Oral BID  . multivitamin  1 tablet Oral QHS  . pantoprazole  40 mg Oral Daily  . predniSONE  5 mg Oral Q breakfast  . sertraline  50 mg Oral QHS  . sodium chloride flush  10 mL Intracatheter Q8H  . sodium chloride flush  10-40 mL Intracatheter Q12H  . tacrolimus  2 mg  Oral BID   dextrose, docusate sodium, heparin, HYDROmorphone (DILAUDID) injection, metoprolol tartrate, polyethylene glycol, sodium chloride flush  Assessment/ Plan:  OP HD: [facility] TTS     3.5h  400/500   66kg  3K/2.5Ca bath  AVF  Hep none  - calcitriol 1 tiw  - venofer 100 x 5 , started 3/19  - mircera 100 q2 (last dose during prior admit)   Assessment/ Plan: 1. Hypotension - suspected septic shock. Recent admission w Enterococcus bacteremia. Large left pleural effusion on CXR. SP chest tube placement w/ ^^drainage. NG on cultures at this time. Hypotension greatly improved. 2. AFib RVR --Usual meds Eliquis, metoprolol as tolerated w/ sepsis. On heparin currently 3. ESRD -HD TTS. Maintain schedule for now.  Next dialysis will be 08/22/2020 4. RUE AVF issues: Missed HD x 2 this d/t clotted access. S/p declot at [facility] 3/24.    AV fistula appears to be functioning this point 5. Hypotension/volume - shock resolved. Appears euvolemic 6. Anemia - Continue outpatient ESA. Holding iron given recent infection 7. Metabolic bone disease -Continue Auryixa binders/VDRA 8. T1DM - Insulin per primary    LOS: [place] @TODAY @10 :05 AM

## 2020-09-17 NOTE — Progress Notes (Signed)
ANTICOAGULATION CONSULT NOTE - Follow Up Consult  Pharmacy Consult for IV Heparin Indication: atrial fibrillation  Labs: Recent Labs    08/18/20 0416 08/19/20 0428 08/19/20 0940 08/19/20 1508 09/09/20 0343 2020-09-09 1115  HGB 9.1* 9.0*  --   --  8.3*  --   HCT 29.0* 29.6*  --   --  27.3*  --   PLT 121* 130*  --   --  111*  --   HEPARINUNFRC 0.51 0.28*  --  0.30 0.27* 0.22*  CREATININE 3.28*  --  3.97*  --   --   --    Assessment: 65 y.o. male with ESRD (S/P failed renal transplant, on HD) on IV heparin for atrial fibrillation. Patient was taking Apixaban PTA for hx Afib >>apixaban held and bridging w/ Heparin due to HD access issues with clotted AV fistula. Temporary HD catheter placed at left IJ on 3/26. Possible vascular surgery consultation to assess permanent HD access.  Heparin level 0.22.  No issues noted with infusion. CBC with mild downtrend. No bleeding noted. HL drawn distal to heparin infusion on the left arm. Discussed with nursing.  Goal of Therapy:  Heparin level 0.3-0.7 units/ml Monitor platelets by anticoagulation protocol: Yes   Plan:  Increase heparin to 2200 units/h Check heparin level in 6-8 hours Daily heparin level and CBC, monitor for signs and sx of bleeding  Thanks for allowing pharmacy to be a part of this patient's care.  Norina Buzzard, PharmD PGY1 Pharmacy Resident 09/09/2020 12:14 PM

## 2020-09-17 NOTE — Progress Notes (Signed)
PROGRESS NOTE    Paul [name]  GQQ:[id number] DOB: March 05, 1956 DOA: 08/12/2020 PCP: [name], MD    Brief Narrative:  [name] admitted to the hospital with undifferentiated shock, in the setting of large left pleural effusion.  65 year old male past medical history for end-stage renal disease on hemodialysis, failed renal transplant currently on immunosuppression, systolic heart failure, atrial fibrillation, duodenal ulcer status post Whipple 2002, osteomyelitis, peripheral vascular disease status post bilateral BKA, history of Covid infection01/2022,who presented with dyspnea.   He is a resident from a local nursing home, unfortunately he had missed2hemodialysis sessions due to inability to access right upper extremity fistula (suspected clotts). In the emergency department patient was hypotensive with blood pressure 75/53, heart rate 127, temperature 37.5, respiratory rate 23, oxygen saturation 100%, his lungs had diminished breath sounds on the left side, increased work of breathing, no wheezing, heart S1-S2, present, rhythmic, abdomen was soft and nontender, positive bilateral BKA. No rashes.  Sodium 139, potassium 4.8, chloride 102, bicarb 23, glucose 235, BUN 76, creatinine 6.79, AST 14, ALT 8, lactic acid 2.2, white count 6.5, hemoglobin 7.9, hematocrit 25.9, platelets 168. SARS COVID-19 negative.  Chest radiograph withlarge left pleural effusion.  EKG 130 bpm, normal axis, prolonged QTC 512, atrial fibrillation rhythm, no ST segment changes, Q-wave V1-V2, T wave inversions lead I, aVL, V4-V6,  Patient was placed to the intensive care unit, he was placed on vasopressors and underwent left-sided pigtail catheter placement. Initially received broad-spectrum IV antibiotic therapy  A temporary hemodialysis catheter was placed left IJ,and underwent hemodialysis.  Transferred to [facility] 3/29.  Loculated pleural effusion, sp TPA and dornase x 3, continue chest  tube to suction (-20 cm H20) with large output.   Still not able to use AV fistula, and using catheter for dialysis.   08/18/2020: Patient seen alongside patient's nurse.  Patient remains lethargic.  Patient responds to painful stimuli.  Discussed with patient's wife extensively.  Guarded prognosis.  Patient is hospice appropriate.  Will consult palliative care team. 08/19/2020: Patient seen alongside patient's nurse.  Patient underwent hemodialysis today.  Patient opens his eyes intermittently, otherwise, remains largely noncommunicative.  Prognosis remains guarded.  Goal of care will need to be addressed. 08/25/2020: Patient seen.  Patient seems more awake today.  Family is leaning towards comfort directed care.  Palliative care team has been consulted.  Assessment & Plan:   Principal Problem:   Hypotension Active Problems:   ESRD on hemodialysis (HCC)   DM (diabetes mellitus) ([place])   Atrial fibrillation (HCC)   Depression   S/P BKA (below knee amputation) bilateral (HCC)   Immunosuppression due to drug therapy ([place])   History of Whipple procedure   Heart failure with reduced ejection fraction ([place])   Chest tube in place   Pressure injury of skin    1. Shock undifferentiated, likely related to large left pleural effusion/ or vasoplegia. (ruled out septic shock) Midodrine has been discontinued with good toleration, blood pressure systolic 671 mmHg.   Continue left chest tube to large pleural effusion. Continue telemetry monitoring.  Continue to hold on midodrine for now. 08/19/2020: Midodrine has been discontinued.  Blood pressure is more stable.  Patient remains very ill.  2. Large left loculated exudative pleural effusion/ acute hypoxemic respiratory failure. Positive right pleural effusion.  Chest tube to suction (-20 cm H20) with no air leak, continue to have high out put 1,240 ml, now SP tpa and dornase x3.  Follow up chest film today with bilateral pleural  effusions more right  than left, chest tube in place with no pneumothorax.   On ultrafiltration with hemodialysis.   Output needs to be less than 100 ml per day in order to remove chest tube. 2020/09/03: Pulmonary team is assisting with management of the loculated pleural effusion.  3. ERSD on HD/ failed renal transplant. Has a temporary catheter at the left IJ.   Not able to use right upper extremity fistula, continue with HD per nephrology recommendations.  Possible vascular surgery consultation to assess permanent HD access.  Continue with immunosuppressive therapy (prednisone and tacrolimus). 08/19/2020: Patient underwent hemodialysis today.  Patient remains on Prograf on prednisone. 2020-09-03: Patient is currently hemodialysis TTS.  Further management will depend on goal of care.  4. Chronic diastolic heart failure/ atrial fibrillation. Rate control with metoprolol, and continue anticoagulation with heparin, until more stable HD access in place.  September 03, 2020: Compensated diastolic CHF.  5. Severe protein calorie malnutrition/ stage 2 pressure ulcer at the sacrum (present on admission). Continue with nutritional supplements.   Continue with skin care per protocol.   6. PVD. Sp BKA bilaterally. Poor very poor mobility.   7. T2DM hypoglycemia.  Post HD capillary glucose is 69, add amp of D50 IV, follow up 98. Discontinue sliding scale insulin to prevent recurrent hypoglycemia.   8. Depression, Continue with sertraline.  Status is: Inpatient  Remains inpatient appropriate because:IV treatments appropriate due to intensity of illness or inability to take PO   Dispo: The patient is from: SNF              Anticipated d/c is to: SNF              Patient currently is not medically stable to d/c.   Difficult to place patient No   DVT prophylaxis:  heparin   Code Status:   dnr   Family Communication:  No family at the bedside      Nutrition Status: Nutrition Problem: Increased nutrient  needs Etiology: chronic illness (ESRD on HD) Signs/Symptoms: estimated needs Interventions: Nepro shake,MVI     Consultants:   Nephrology   Pulmonary   Procedures:   Left chest tube and left IJ HD cathter.      Subjective: No significant history from patient. Patient opens his eyes intermittently and follows simple commands.    Objective: Vitals:   03-Sep-2020 0403 2020-09-03 0740 09-03-2020 1238 03-Sep-2020 1328  BP:  (!) 117/57 97/77   Pulse:  (!) 51 (!) 56   Resp:  15 20 20   Temp:  97.8 F (36.6 C) 97.8 F (36.6 C)   TempSrc:  Axillary Axillary   SpO2:  97% 94% 100%  Weight: 62.7 kg     Height:        Intake/Output Summary (Last 24 hours) at Sep 03, 2020 1544 Last data filed at September 03, 2020 1000 Gross per 24 hour  Intake 815.69 ml  Output --  Net 815.69 ml   Filed Weights   08/19/20 0910 08/19/20 1313 09/03/2020 0403  Weight: 63 kg 62.8 kg 62.7 kg    Examination:   General: Opens his eyes intermittently and follows simple commands.  Not in any distress. Neurology: And opens his eyes intermittently and follows simple command.  More awake today.  E ENT: mild pallor, no icterus, oral mucosa moist Cardiovascular: No JVD. S1-S2 present, rhythmic, no gallops, rubs, or murmurs. No lower extremity edema. Pulmonary: positive breath sounds bilaterally, with no wheezing, decreased breath sounds bilateral at bases.  Gastrointestinal. Abdomen soft and non  tender Skin. No rashes Musculoskeletal: bilateral BKA.      Data Reviewed: I have personally reviewed following labs and imaging studies  CBC: Recent Labs  Lab 08/16/20 0110 08/17/20 0326 08/18/20 0416 08/19/20 0428 2020-09-07 0343  WBC 8.3 10.3 17.0* 17.2* 16.0*  HGB 8.5* 9.0* 9.1* 9.0* 8.3*  HCT 27.7* 29.7* 29.0* 29.6* 27.3*  MCV 97.5 99.3 96.3 98.7 99.3  PLT 116* 141* 121* 130* 268*   Basic Metabolic Panel: Recent Labs  Lab 08/15/20 0244 08/16/20 0110 08/17/20 0326 08/18/20 0416 08/19/20 0940  NA 135 134*  135 138 133*  K 3.9 3.5 3.4* 3.8 3.7  CL 100 99 101 100 99  CO2 26 26 23  17* 16*  GLUCOSE 198* 217* 114* 119* 202*  BUN 65* 44* 62* 39* 54*  CREATININE 4.99* 3.56* 4.13* 3.28* 3.97*  CALCIUM 8.1* 7.7* 7.9* 8.6* 7.7*  PHOS  --   --   --   --  6.4*   GFR: Estimated Creatinine Clearance: 16.2 mL/min (A) (by C-G formula based on SCr of 3.97 mg/dL (H)). Liver Function Tests: Recent Labs  Lab 08/19/20 0940  ALBUMIN 1.5*   No results for input(s): LIPASE, AMYLASE in the last 168 hours. No results for input(s): AMMONIA in the last 168 hours. Coagulation Profile: No results for input(s): INR, PROTIME in the last 168 hours. Cardiac Enzymes: No results for input(s): CKTOTAL, CKMB, CKMBINDEX, TROPONINI in the last 168 hours. BNP (last 3 results) No results for input(s): PROBNP in the last 8760 hours. HbA1C: No results for input(s): HGBA1C in the last 72 hours. CBG: Recent Labs  Lab 08/19/20 1955 September 07, 2020 0020 2020-09-07 0400 07-Sep-2020 0731 07-Sep-2020 1235  GLUCAP 129* 138* 154* 168* 165*   Lipid Profile: No results for input(s): CHOL, HDL, LDLCALC, TRIG, CHOLHDL, LDLDIRECT in the last 72 hours. Thyroid Function Tests: No results for input(s): TSH, T4TOTAL, FREET4, T3FREE, THYROIDAB in the last 72 hours. Anemia Panel: No results for input(s): VITAMINB12, FOLATE, FERRITIN, TIBC, IRON, RETICCTPCT in the last 72 hours.    Radiology Studies: I have reviewed all of the imaging during this hospital visit personally     Scheduled Meds: . Chlorhexidine Gluconate Cloth  6 each Topical Q0600  . Chlorhexidine Gluconate Cloth  6 each Topical Q0600  . feeding supplement (NEPRO CARB STEADY)  237 mL Oral TID BM  . lipase/protease/amylase  12,000 Units Oral TID AC  . metoprolol tartrate  25 mg Oral BID  . multivitamin  1 tablet Oral QHS  . pantoprazole  40 mg Oral Daily  . predniSONE  5 mg Oral Q breakfast  . sertraline  50 mg Oral QHS  . sodium chloride flush  10 mL Intracatheter Q8H   . sodium chloride flush  10-40 mL Intracatheter Q12H  . tacrolimus  2 mg Oral BID   Continuous Infusions: . heparin 2,200 Units/hr (07-Sep-2020 1522)     LOS: 9 days        Bonnell Public, MD

## 2020-09-17 NOTE — Progress Notes (Signed)
ANTICOAGULATION CONSULT NOTE - Follow Up Consult  Pharmacy Consult for IV Heparin Indication: atrial fibrillation  Labs: Recent Labs    08/18/20 0416 08/19/20 0428 08/19/20 0940 08/19/20 1508 08/21/20 0343 08-21-2020 1115 08/21/2020 1944  HGB 9.1* 9.0*  --   --  8.3*  --   --   HCT 29.0* 29.6*  --   --  27.3*  --   --   PLT 121* 130*  --   --  111*  --   --   HEPARINUNFRC 0.51 0.28*  --    < > 0.27* 0.22* 0.24*  CREATININE 3.28*  --  3.97*  --   --   --   --    < > = values in this interval not displayed.   Assessment: 65 y.o. male with ESRD (S/P failed renal transplant, on HD) on IV heparin for atrial fibrillation. Patient was taking Apixaban PTA for hx Afib >>apixaban held and bridging w/ Heparin due to HD access issues with clotted AV fistula. Temporary HD catheter placed at left IJ on 3/26. Possible vascular surgery consultation to assess permanent HD access.  Heparin level still low at 0.24 this evening.  No issues noted with infusion. CBC with mild downtrend this am.   Goal of Therapy:  Heparin level 0.3-0.7 units/ml Monitor platelets by anticoagulation protocol: Yes   Plan:  Increase heparin to 2400 units/h Check heparin level in 6-8 hours Daily heparin level and CBC, monitor for signs and sx of bleeding  Thanks for allowing pharmacy to be a part of this patient's care.  Erin Hearing PharmD., BCPS Clinical Pharmacist 08/21/2020 8:52 PM

## 2020-09-17 NOTE — Progress Notes (Signed)
ANTICOAGULATION CONSULT NOTE - Follow Up Consult  Pharmacy Consult for IV Heparin Indication: atrial fibrillation  Labs: Recent Labs    08/18/20 0416 08/19/20 0428 08/19/20 0940 08/19/20 1508 2020/08/23 0343  HGB 9.1* 9.0*  --   --   --   HCT 29.0* 29.6*  --   --   --   PLT 121* 130*  --   --   --   HEPARINUNFRC 0.51 0.28*  --  0.30 0.27*  CREATININE 3.28*  --  3.97*  --   --    Assessment: 65 y.o. male with ESRD (S/P failed renal transplant, on HD) on IV heparin for atrial fibrillation. Patient was taking Apixaban PTA for hx Afib >>apixaban held and bridging w/ Heparin due to HD access issues with clotted AV fistula. Temporary HD catheter placed at left IJ on 3/26. Possible vascular surgery consultation to assess permanent HD access.  Heparin level 0.27 units/ml.  No issues noted with infusion   Goal of Therapy:  Heparin level 0.3-0.7 units/ml Monitor platelets by anticoagulation protocol: Yes   Plan:  Increase heparin to 2000 units/h Check heparin level in 6-8 hours Daily heparin level and CBC  Thanks for allowing pharmacy to be a part of this patient's care.  Excell Seltzer, PharmD Clinical Pharmacist August 23, 2020 4:49 AM

## 2020-09-17 DEATH — deceased

## 2020-10-18 NOTE — Discharge Summary (Signed)
Death discharge summary: Admitted on: 09-08-2020. Died on: 09-17-20  Patient was a 65 year old male past medical history for end-stage renal disease on hemodialysis, failed renal transplant currently on immunosuppression, systolic heart failure, atrial fibrillation, duodenal ulcer status post Whipple 2002, osteomyelitis, peripheral vascular disease status post bilateral BKA, history of Covid infection01/2022.  Patient was admitted to the hospital with undifferentiated shock, in the setting of large left pleural effusion.  On presentation to the emergency department, patient was hypotensive with blood pressure 75/53, heart rate 127, with diminished breath sounds on the left side, increased work of breathing.  Patient was admitted and managed for severe sepsis.  Patient had a prolonged hospital course.  Despite all aggressive measures, patient continued to deteriorate.  Patient eventually died on 09/17/20.
